# Patient Record
Sex: Female | Born: 1960 | Race: White | Hispanic: No | Marital: Married
Health system: Southern US, Academic
[De-identification: ages and names within clinical notes are randomized; demographics above are authoritative.]

## PROBLEM LIST (undated history)

## (undated) ENCOUNTER — Encounter: Attending: Nurse Practitioner | Primary: Nurse Practitioner

## (undated) ENCOUNTER — Encounter: Attending: Internal Medicine | Primary: Internal Medicine

## (undated) ENCOUNTER — Ambulatory Visit: Payer: PRIVATE HEALTH INSURANCE

## (undated) ENCOUNTER — Encounter

## (undated) ENCOUNTER — Encounter
Attending: Student in an Organized Health Care Education/Training Program | Primary: Student in an Organized Health Care Education/Training Program

## (undated) ENCOUNTER — Encounter: Attending: Primary Care | Primary: Primary Care

## (undated) ENCOUNTER — Ambulatory Visit

## (undated) ENCOUNTER — Telehealth

## (undated) ENCOUNTER — Encounter: Attending: Oncology | Primary: Oncology

## (undated) ENCOUNTER — Encounter
Attending: Pharmacist Clinician (PhC)/ Clinical Pharmacy Specialist | Primary: Pharmacist Clinician (PhC)/ Clinical Pharmacy Specialist

## (undated) ENCOUNTER — Encounter: Attending: Hematology & Oncology | Primary: Hematology & Oncology

## (undated) ENCOUNTER — Encounter: Attending: Family Medicine | Primary: Family Medicine

## (undated) ENCOUNTER — Telehealth
Attending: Pharmacist Clinician (PhC)/ Clinical Pharmacy Specialist | Primary: Pharmacist Clinician (PhC)/ Clinical Pharmacy Specialist

## (undated) ENCOUNTER — Encounter: Attending: Critical Care Medicine | Primary: Critical Care Medicine

## (undated) ENCOUNTER — Telehealth: Attending: Hematology & Oncology | Primary: Hematology & Oncology

## (undated) ENCOUNTER — Telehealth: Attending: Pharmacist | Primary: Pharmacist

## (undated) ENCOUNTER — Encounter: Attending: Adult Health | Primary: Adult Health

## (undated) ENCOUNTER — Telehealth: Attending: Oncology | Primary: Oncology

## (undated) ENCOUNTER — Encounter: Attending: Infectious Disease | Primary: Infectious Disease

## (undated) ENCOUNTER — Encounter: Payer: PRIVATE HEALTH INSURANCE | Attending: Registered" | Primary: Registered"

## (undated) ENCOUNTER — Ambulatory Visit
Payer: PRIVATE HEALTH INSURANCE | Attending: Pharmacist Clinician (PhC)/ Clinical Pharmacy Specialist | Primary: Pharmacist Clinician (PhC)/ Clinical Pharmacy Specialist

## (undated) ENCOUNTER — Telehealth: Attending: Adult Health | Primary: Adult Health

## (undated) ENCOUNTER — Ambulatory Visit: Payer: PRIVATE HEALTH INSURANCE | Attending: Infectious Disease | Primary: Infectious Disease

## (undated) ENCOUNTER — Telehealth: Attending: Registered" | Primary: Registered"

## (undated) ENCOUNTER — Encounter: Attending: Registered" | Primary: Registered"

## (undated) ENCOUNTER — Telehealth
Attending: Student in an Organized Health Care Education/Training Program | Primary: Student in an Organized Health Care Education/Training Program

## (undated) ENCOUNTER — Encounter: Attending: Pharmacist | Primary: Pharmacist

## (undated) ENCOUNTER — Ambulatory Visit: Attending: Clinical | Primary: Clinical

## (undated) ENCOUNTER — Encounter: Attending: Pediatrics | Primary: Pediatrics

## (undated) ENCOUNTER — Encounter: Payer: PRIVATE HEALTH INSURANCE | Attending: Adult Health | Primary: Adult Health

## (undated) ENCOUNTER — Telehealth: Attending: Infectious Disease | Primary: Infectious Disease

## (undated) ENCOUNTER — Telehealth: Attending: Internal Medicine | Primary: Internal Medicine

## (undated) ENCOUNTER — Non-Acute Institutional Stay: Payer: PRIVATE HEALTH INSURANCE

## (undated) ENCOUNTER — Telehealth: Attending: Primary Care | Primary: Primary Care

## (undated) ENCOUNTER — Ambulatory Visit: Attending: Internal Medicine | Primary: Internal Medicine

## (undated) ENCOUNTER — Encounter: Payer: PRIVATE HEALTH INSURANCE | Attending: Hematology & Oncology | Primary: Hematology & Oncology

## (undated) ENCOUNTER — Ambulatory Visit
Payer: PRIVATE HEALTH INSURANCE | Attending: Rehabilitative and Restorative Service Providers" | Primary: Rehabilitative and Restorative Service Providers"

## (undated) ENCOUNTER — Inpatient Hospital Stay

## (undated) DIAGNOSIS — I341 Nonrheumatic mitral (valve) prolapse: Secondary | ICD-10-CM

## (undated) DIAGNOSIS — F329 Major depressive disorder, single episode, unspecified: Secondary | ICD-10-CM

## (undated) DIAGNOSIS — R768 Other specified abnormal immunological findings in serum: Secondary | ICD-10-CM

## (undated) DIAGNOSIS — D72820 Lymphocytosis (symptomatic): Secondary | ICD-10-CM

## (undated) DIAGNOSIS — F419 Anxiety disorder, unspecified: Secondary | ICD-10-CM

## (undated) DIAGNOSIS — F32A Depression, unspecified: Secondary | ICD-10-CM

## (undated) DIAGNOSIS — E079 Disorder of thyroid, unspecified: Secondary | ICD-10-CM

## (undated) DIAGNOSIS — C801 Malignant (primary) neoplasm, unspecified: Secondary | ICD-10-CM

## (undated) DIAGNOSIS — IMO0002 Reserved for concepts with insufficient information to code with codable children: Secondary | ICD-10-CM

## (undated) DIAGNOSIS — D219 Benign neoplasm of connective and other soft tissue, unspecified: Secondary | ICD-10-CM

## (undated) DIAGNOSIS — E039 Hypothyroidism, unspecified: Secondary | ICD-10-CM

## (undated) HISTORY — DX: Benign neoplasm of connective and other soft tissue, unspecified: D21.9

## (undated) HISTORY — PX: REVISION OF ABDOMINAL SCAR: SHX2347

## (undated) HISTORY — DX: Other specified abnormal immunological findings in serum: R76.8

## (undated) HISTORY — DX: Lymphocytosis (symptomatic): D72.820

## (undated) HISTORY — DX: Reserved for concepts with insufficient information to code with codable children: IMO0002

## (undated) HISTORY — DX: Nonrheumatic mitral (valve) prolapse: I34.1

## (undated) HISTORY — PX: TONSILLECTOMY: SUR1361

## (undated) HISTORY — DX: Disorder of thyroid, unspecified: E07.9

## (undated) MED ORDER — POLYETHYLENE GLYCOL 3350 17 GRAM/DOSE ORAL POWDER
Freq: Every day | ORAL | 0 days | PRN
Start: ? — End: 2020-08-12

## (undated) MED ORDER — ZOLPIDEM 10 MG TABLET: Freq: Every evening | ORAL | 0 days | Status: SS | PRN

## (undated) MED ORDER — POLYETHYLENE GLYCOL 3350 17 GRAM ORAL POWDER PACKET: Freq: Every day | ORAL | 0.00000 days | PRN

## (undated) MED ORDER — LORATADINE 10 MG TABLET: 10 mg | tablet | Freq: Every day | 2 refills | 30 days | Status: CN

## (undated) MED ORDER — ZOLPIDEM 5 MG TABLET: tablet | 0 refills | 0 days

## (undated) MED ORDER — SENNOSIDES 8.6 MG-DOCUSATE SODIUM 50 MG TABLET: Freq: Every evening | ORAL | 0 days

---

## 1998-05-18 ENCOUNTER — Other Ambulatory Visit: Admission: RE | Admit: 1998-05-18 | Discharge: 1998-05-18 | Payer: Self-pay | Admitting: *Deleted

## 1998-05-19 ENCOUNTER — Other Ambulatory Visit: Admission: RE | Admit: 1998-05-19 | Discharge: 1998-05-19 | Payer: Self-pay | Admitting: *Deleted

## 1998-07-24 HISTORY — PX: BREAST BIOPSY: SHX20

## 1999-05-16 ENCOUNTER — Other Ambulatory Visit: Admission: RE | Admit: 1999-05-16 | Discharge: 1999-05-16 | Payer: Self-pay | Admitting: *Deleted

## 2000-06-18 ENCOUNTER — Other Ambulatory Visit: Admission: RE | Admit: 2000-06-18 | Discharge: 2000-06-18 | Payer: Self-pay | Admitting: *Deleted

## 2000-09-12 ENCOUNTER — Encounter: Payer: Self-pay | Admitting: Family Medicine

## 2000-09-12 ENCOUNTER — Encounter: Admission: RE | Admit: 2000-09-12 | Discharge: 2000-09-12 | Payer: Self-pay | Admitting: Family Medicine

## 2001-06-24 ENCOUNTER — Other Ambulatory Visit: Admission: RE | Admit: 2001-06-24 | Discharge: 2001-06-24 | Payer: Self-pay | Admitting: *Deleted

## 2002-06-26 ENCOUNTER — Other Ambulatory Visit: Admission: RE | Admit: 2002-06-26 | Discharge: 2002-06-26 | Payer: Self-pay | Admitting: *Deleted

## 2003-03-24 ENCOUNTER — Encounter: Admission: RE | Admit: 2003-03-24 | Discharge: 2003-03-24 | Payer: Self-pay | Admitting: *Deleted

## 2003-06-15 ENCOUNTER — Encounter: Admission: RE | Admit: 2003-06-15 | Discharge: 2003-06-15 | Payer: Self-pay | Admitting: Family Medicine

## 2003-09-10 ENCOUNTER — Other Ambulatory Visit: Admission: RE | Admit: 2003-09-10 | Discharge: 2003-09-10 | Payer: Self-pay | Admitting: *Deleted

## 2004-06-20 ENCOUNTER — Encounter: Admission: RE | Admit: 2004-06-20 | Discharge: 2004-06-20 | Payer: Self-pay | Admitting: Neurosurgery

## 2004-09-13 ENCOUNTER — Encounter: Admission: RE | Admit: 2004-09-13 | Discharge: 2004-09-13 | Payer: Self-pay | Admitting: *Deleted

## 2005-10-16 ENCOUNTER — Encounter: Admission: RE | Admit: 2005-10-16 | Discharge: 2005-10-16 | Payer: Self-pay | Admitting: Family Medicine

## 2007-02-26 ENCOUNTER — Encounter: Admission: RE | Admit: 2007-02-26 | Discharge: 2007-02-26 | Payer: Self-pay | Admitting: Family Medicine

## 2008-04-14 ENCOUNTER — Encounter: Admission: RE | Admit: 2008-04-14 | Discharge: 2008-04-14 | Payer: Self-pay | Admitting: Obstetrics and Gynecology

## 2008-04-22 ENCOUNTER — Other Ambulatory Visit: Admission: RE | Admit: 2008-04-22 | Discharge: 2008-04-22 | Payer: Self-pay | Admitting: Obstetrics and Gynecology

## 2009-02-12 ENCOUNTER — Encounter: Admission: RE | Admit: 2009-02-12 | Discharge: 2009-02-12 | Payer: Self-pay | Admitting: Family Medicine

## 2009-02-16 ENCOUNTER — Ambulatory Visit: Payer: Self-pay | Admitting: Oncology

## 2009-03-05 ENCOUNTER — Encounter: Payer: Self-pay | Admitting: Oncology

## 2009-03-05 ENCOUNTER — Other Ambulatory Visit: Admission: RE | Admit: 2009-03-05 | Discharge: 2009-03-05 | Payer: Self-pay | Admitting: Oncology

## 2009-03-05 LAB — CBC WITH DIFFERENTIAL/PLATELET
BASO%: 0.5 % (ref 0.0–2.0)
Basophils Absolute: 0.1 10*3/uL (ref 0.0–0.1)
EOS%: 0.6 % (ref 0.0–7.0)
Eosinophils Absolute: 0.1 10*3/uL (ref 0.0–0.5)
HCT: 38.8 % (ref 34.8–46.6)
HGB: 13.4 g/dL (ref 11.6–15.9)
LYMPH%: 58.2 % — ABNORMAL HIGH (ref 14.0–49.7)
MCH: 31.9 pg (ref 25.1–34.0)
MCHC: 34.5 g/dL (ref 31.5–36.0)
MCV: 92.4 fL (ref 79.5–101.0)
MONO#: 0.5 10*3/uL (ref 0.1–0.9)
MONO%: 3.5 % (ref 0.0–14.0)
NEUT#: 5.3 10*3/uL (ref 1.5–6.5)
NEUT%: 37.2 % — ABNORMAL LOW (ref 38.4–76.8)
Platelets: 260 10*3/uL (ref 145–400)
RBC: 4.2 10*6/uL (ref 3.70–5.45)
RDW: 12.7 % (ref 11.2–14.5)
WBC: 14.2 10*3/uL — ABNORMAL HIGH (ref 3.9–10.3)
lymph#: 8.3 10*3/uL — ABNORMAL HIGH (ref 0.9–3.3)

## 2009-03-05 LAB — CHCC SMEAR

## 2009-03-08 LAB — LACTATE DEHYDROGENASE: LDH: 172 U/L (ref 94–250)

## 2009-03-08 LAB — IGG, IGA, IGM
IgA: 120 mg/dL (ref 68–378)
IgG (Immunoglobin G), Serum: 432 mg/dL — ABNORMAL LOW (ref 694–1618)
IgM, Serum: 63 mg/dL (ref 60–263)

## 2009-03-08 LAB — BETA 2 MICROGLOBULIN, SERUM: Beta-2 Microglobulin: 1.71 mg/L (ref 1.01–1.73)

## 2009-03-10 LAB — FLOW CYTOMETRY

## 2009-04-23 ENCOUNTER — Other Ambulatory Visit: Admission: RE | Admit: 2009-04-23 | Discharge: 2009-04-23 | Payer: Self-pay | Admitting: Obstetrics and Gynecology

## 2009-09-06 ENCOUNTER — Ambulatory Visit: Payer: Self-pay | Admitting: Oncology

## 2009-09-07 LAB — CBC WITH DIFFERENTIAL/PLATELET
BASO%: 0.3 % (ref 0.0–2.0)
Basophils Absolute: 0.1 10*3/uL (ref 0.0–0.1)
EOS%: 0.7 % (ref 0.0–7.0)
Eosinophils Absolute: 0.1 10*3/uL (ref 0.0–0.5)
HCT: 42.2 % (ref 34.8–46.6)
HGB: 14.4 g/dL (ref 11.6–15.9)
LYMPH%: 67.7 % — ABNORMAL HIGH (ref 14.0–49.7)
MCH: 31.2 pg (ref 25.1–34.0)
MCHC: 34.1 g/dL (ref 31.5–36.0)
MCV: 91.3 fL (ref 79.5–101.0)
MONO#: 0.5 10*3/uL (ref 0.1–0.9)
MONO%: 3.4 % (ref 0.0–14.0)
NEUT#: 4 10*3/uL (ref 1.5–6.5)
NEUT%: 27.9 % — ABNORMAL LOW (ref 38.4–76.8)
Platelets: 264 10*3/uL (ref 145–400)
RBC: 4.62 10*6/uL (ref 3.70–5.45)
RDW: 12.1 % (ref 11.2–14.5)
WBC: 14.3 10*3/uL — ABNORMAL HIGH (ref 3.9–10.3)
lymph#: 9.7 10*3/uL — ABNORMAL HIGH (ref 0.9–3.3)

## 2009-11-17 ENCOUNTER — Encounter: Admission: RE | Admit: 2009-11-17 | Discharge: 2009-11-17 | Payer: Self-pay | Admitting: Internal Medicine

## 2010-04-28 ENCOUNTER — Other Ambulatory Visit: Admission: RE | Admit: 2010-04-28 | Discharge: 2010-04-28 | Payer: Self-pay | Admitting: Obstetrics and Gynecology

## 2010-09-05 ENCOUNTER — Other Ambulatory Visit: Payer: Self-pay | Admitting: Oncology

## 2010-09-05 ENCOUNTER — Encounter (HOSPITAL_BASED_OUTPATIENT_CLINIC_OR_DEPARTMENT_OTHER): Payer: BC Managed Care – PPO | Admitting: Oncology

## 2010-09-05 DIAGNOSIS — C9110 Chronic lymphocytic leukemia of B-cell type not having achieved remission: Secondary | ICD-10-CM

## 2010-09-05 DIAGNOSIS — M542 Cervicalgia: Secondary | ICD-10-CM

## 2010-09-05 DIAGNOSIS — E039 Hypothyroidism, unspecified: Secondary | ICD-10-CM

## 2010-09-05 DIAGNOSIS — D72820 Lymphocytosis (symptomatic): Secondary | ICD-10-CM

## 2010-09-05 LAB — CBC WITH DIFFERENTIAL/PLATELET
BASO%: 0.3 % (ref 0.0–2.0)
Basophils Absolute: 0 10*3/uL (ref 0.0–0.1)
EOS%: 0.3 % (ref 0.0–7.0)
Eosinophils Absolute: 0 10*3/uL (ref 0.0–0.5)
HCT: 39.4 % (ref 34.8–46.6)
HGB: 13.4 g/dL (ref 11.6–15.9)
LYMPH%: 64 % — ABNORMAL HIGH (ref 14.0–49.7)
MCH: 31 pg (ref 25.1–34.0)
MCHC: 34 g/dL (ref 31.5–36.0)
MCV: 91.2 fL (ref 79.5–101.0)
MONO#: 0.4 10*3/uL (ref 0.1–0.9)
MONO%: 2.4 % (ref 0.0–14.0)
NEUT#: 4.8 10*3/uL (ref 1.5–6.5)
NEUT%: 33 % — ABNORMAL LOW (ref 38.4–76.8)
Platelets: 283 10*3/uL (ref 145–400)
RBC: 4.32 10*6/uL (ref 3.70–5.45)
RDW: 12.7 % (ref 11.2–14.5)
WBC: 14.4 10*3/uL — ABNORMAL HIGH (ref 3.9–10.3)
lymph#: 9.2 10*3/uL — ABNORMAL HIGH (ref 0.9–3.3)

## 2011-09-04 ENCOUNTER — Encounter: Payer: Self-pay | Admitting: *Deleted

## 2011-09-05 ENCOUNTER — Other Ambulatory Visit: Payer: BC Managed Care – PPO

## 2011-09-05 ENCOUNTER — Ambulatory Visit: Payer: BC Managed Care – PPO | Admitting: Oncology

## 2012-02-08 ENCOUNTER — Other Ambulatory Visit: Payer: Self-pay | Admitting: Internal Medicine

## 2012-02-08 DIAGNOSIS — Z1231 Encounter for screening mammogram for malignant neoplasm of breast: Secondary | ICD-10-CM

## 2012-03-20 ENCOUNTER — Ambulatory Visit
Admission: RE | Admit: 2012-03-20 | Discharge: 2012-03-20 | Disposition: A | Discharge status: Home / Self Care | Payer: BC Managed Care – PPO | Source: Ambulatory Visit | Attending: Internal Medicine | Admitting: Internal Medicine

## 2012-03-20 DIAGNOSIS — Z1231 Encounter for screening mammogram for malignant neoplasm of breast: Secondary | ICD-10-CM

## 2014-01-27 ENCOUNTER — Other Ambulatory Visit: Payer: Self-pay

## 2014-01-27 DIAGNOSIS — Z1231 Encounter for screening mammogram for malignant neoplasm of breast: Secondary | ICD-10-CM

## 2014-02-02 ENCOUNTER — Ambulatory Visit
Admission: RE | Admit: 2014-02-02 | Discharge: 2014-02-02 | Disposition: A | Discharge status: Home / Self Care | Payer: BC Managed Care – PPO | Source: Ambulatory Visit

## 2014-02-02 ENCOUNTER — Encounter (INDEPENDENT_AMBULATORY_CARE_PROVIDER_SITE_OTHER): Payer: Self-pay

## 2014-02-02 DIAGNOSIS — Z1231 Encounter for screening mammogram for malignant neoplasm of breast: Secondary | ICD-10-CM

## 2015-03-22 ENCOUNTER — Other Ambulatory Visit: Payer: Self-pay

## 2015-03-22 DIAGNOSIS — Z1231 Encounter for screening mammogram for malignant neoplasm of breast: Secondary | ICD-10-CM

## 2015-05-17 ENCOUNTER — Ambulatory Visit
Admission: RE | Admit: 2015-05-17 | Discharge: 2015-05-17 | Disposition: A | Discharge status: Home / Self Care | Payer: BLUE CROSS/BLUE SHIELD | Source: Ambulatory Visit

## 2015-05-17 DIAGNOSIS — Z1231 Encounter for screening mammogram for malignant neoplasm of breast: Secondary | ICD-10-CM

## 2015-12-30 DIAGNOSIS — F332 Major depressive disorder, recurrent severe without psychotic features: Secondary | ICD-10-CM | POA: Diagnosis not present

## 2016-01-19 DIAGNOSIS — F332 Major depressive disorder, recurrent severe without psychotic features: Secondary | ICD-10-CM | POA: Diagnosis not present

## 2016-02-16 DIAGNOSIS — F332 Major depressive disorder, recurrent severe without psychotic features: Secondary | ICD-10-CM | POA: Diagnosis not present

## 2016-02-18 DIAGNOSIS — H5203 Hypermetropia, bilateral: Secondary | ICD-10-CM | POA: Diagnosis not present

## 2016-02-29 DIAGNOSIS — Z79899 Other long term (current) drug therapy: Secondary | ICD-10-CM | POA: Diagnosis not present

## 2016-02-29 DIAGNOSIS — F332 Major depressive disorder, recurrent severe without psychotic features: Secondary | ICD-10-CM | POA: Diagnosis not present

## 2016-02-29 DIAGNOSIS — T23232A Burn of second degree of multiple left fingers (nail), not including thumb, initial encounter: Secondary | ICD-10-CM | POA: Diagnosis not present

## 2016-02-29 DIAGNOSIS — T23242A Burn of second degree of multiple left fingers (nail), including thumb, initial encounter: Secondary | ICD-10-CM | POA: Diagnosis not present

## 2016-03-30 DIAGNOSIS — L814 Other melanin hyperpigmentation: Secondary | ICD-10-CM | POA: Diagnosis not present

## 2016-03-30 DIAGNOSIS — L821 Other seborrheic keratosis: Secondary | ICD-10-CM | POA: Diagnosis not present

## 2016-03-30 DIAGNOSIS — D235 Other benign neoplasm of skin of trunk: Secondary | ICD-10-CM | POA: Diagnosis not present

## 2016-03-30 DIAGNOSIS — D1801 Hemangioma of skin and subcutaneous tissue: Secondary | ICD-10-CM | POA: Diagnosis not present

## 2016-04-19 DIAGNOSIS — F332 Major depressive disorder, recurrent severe without psychotic features: Secondary | ICD-10-CM | POA: Diagnosis not present

## 2016-05-17 DIAGNOSIS — Z79899 Other long term (current) drug therapy: Secondary | ICD-10-CM | POA: Diagnosis not present

## 2016-05-17 DIAGNOSIS — F332 Major depressive disorder, recurrent severe without psychotic features: Secondary | ICD-10-CM | POA: Diagnosis not present

## 2016-06-13 DIAGNOSIS — E038 Other specified hypothyroidism: Secondary | ICD-10-CM | POA: Diagnosis not present

## 2016-06-13 DIAGNOSIS — R8299 Other abnormal findings in urine: Secondary | ICD-10-CM | POA: Diagnosis not present

## 2016-06-13 DIAGNOSIS — Z0000 Encounter for general adult medical examination without abnormal findings: Secondary | ICD-10-CM | POA: Diagnosis not present

## 2016-06-13 DIAGNOSIS — Z23 Encounter for immunization: Secondary | ICD-10-CM | POA: Diagnosis not present

## 2016-06-20 DIAGNOSIS — G4700 Insomnia, unspecified: Secondary | ICD-10-CM | POA: Diagnosis not present

## 2016-06-20 DIAGNOSIS — Z1389 Encounter for screening for other disorder: Secondary | ICD-10-CM | POA: Diagnosis not present

## 2016-06-20 DIAGNOSIS — F321 Major depressive disorder, single episode, moderate: Secondary | ICD-10-CM | POA: Diagnosis not present

## 2016-06-20 DIAGNOSIS — J302 Other seasonal allergic rhinitis: Secondary | ICD-10-CM | POA: Diagnosis not present

## 2016-06-20 DIAGNOSIS — E039 Hypothyroidism, unspecified: Secondary | ICD-10-CM | POA: Diagnosis not present

## 2016-06-20 DIAGNOSIS — Z0000 Encounter for general adult medical examination without abnormal findings: Secondary | ICD-10-CM | POA: Diagnosis not present

## 2016-06-22 ENCOUNTER — Telehealth: Payer: Self-pay | Admitting: *Deleted

## 2016-06-22 NOTE — Telephone Encounter (Signed)
Called pt to inquire about reason for referral. Pt reports her labs have changed but she "can't talk right now." Requests to be called tomorrow AM.  Requested HIM get lab results, Dr. Benay Spice may work her in on call day 12/1 after reviewing labs.

## 2016-06-23 ENCOUNTER — Telehealth: Payer: Self-pay | Admitting: Oncology

## 2016-06-23 ENCOUNTER — Other Ambulatory Visit: Payer: Self-pay | Admitting: *Deleted

## 2016-06-23 ENCOUNTER — Telehealth: Payer: Self-pay | Admitting: *Deleted

## 2016-06-23 ENCOUNTER — Ambulatory Visit (INDEPENDENT_AMBULATORY_CARE_PROVIDER_SITE_OTHER): Payer: BLUE CROSS/BLUE SHIELD | Admitting: Internal Medicine

## 2016-06-23 DIAGNOSIS — Z7184 Encounter for health counseling related to travel: Secondary | ICD-10-CM

## 2016-06-23 DIAGNOSIS — Z789 Other specified health status: Secondary | ICD-10-CM

## 2016-06-23 DIAGNOSIS — C9110 Chronic lymphocytic leukemia of B-cell type not having achieved remission: Secondary | ICD-10-CM

## 2016-06-23 DIAGNOSIS — Z7189 Other specified counseling: Secondary | ICD-10-CM

## 2016-06-23 DIAGNOSIS — Z23 Encounter for immunization: Secondary | ICD-10-CM | POA: Diagnosis not present

## 2016-06-23 DIAGNOSIS — Z9189 Other specified personal risk factors, not elsewhere classified: Secondary | ICD-10-CM

## 2016-06-23 MED ORDER — AZITHROMYCIN 500 MG PO TABS: 500.0000 mg | ORAL_TABLET | Freq: Every day | ORAL | 0 refills | Status: DC

## 2016-06-23 MED ORDER — TYPHOID VACCINE PO CPDR: 1.0000 | DELAYED_RELEASE_CAPSULE | ORAL | 0 refills | Status: DC

## 2016-06-23 MED ORDER — ATOVAQUONE-PROGUANIL HCL 250-100 MG PO TABS: 1.0000 | ORAL_TABLET | Freq: Every day | ORAL | 0 refills | Status: DC

## 2016-06-23 NOTE — Progress Notes (Signed)
Subjective:   Meagan Walsh is a 55 y.o. female who presents to the Infectious Disease clinic for travel consultation. She and her family (husband nad 3 grown children) will be taking a holiday from dec 18 thru dec 31st to [place], [place], [place]. Staying in hotels as well as lodges for safari  Prior travel out of [place]: yes     Objective:   Medications: synthroid, cymbalta,     Assessment:   No contraindications to travel. none    Plan:    pre travel vaccination = gave hep A, oral typhoid vaccine  Malaria prophylaxis = gave rx for malarone  Traveler's diarrhea = gave rx for azithromycin

## 2016-06-23 NOTE — Telephone Encounter (Signed)
Lt vm for GMA (Misty) to fax recent labs.

## 2016-06-23 NOTE — Patient Instructions (Signed)
[facility]                [address], [place]                   [place], [place] [address]                      Phone: [phone]                        Fax: [phone]   Planned departure date: dec 18   Planned return date: dec 31 Countries of travel: [place] and nearby countries  Guidelines for the Prevention & Treatment of Traveler's Diarrhea  Prevention: "Boil it, Peel it, [place] it, or Forget it"   the fewer chances -> lower risk: try to stick to food & water precautions as much as possible"   If it's "piping hot"; it is probably okay, if not, it may not be   Treatment   1) You should always take care to drink lots of fluids in order to avoid dehydration   2) You should bring medications with you in case you come down with a case of diarrhea   3) OTC = bring pepto-bismol - can take with initial abdominal symptoms;                    Imodium - can help slow down your intestinal tract, can help relief cramps                    and diarrhea, can take if no bloody diarrhea  Use pepto-bismol first if needed for traveler's diarrhea. If still on going, can take azithromycin  Guidelines for the Prevention of Malaria  Avoidance:  -fewer mosquito bites = lower risk. Mosquitos can bite at night as well as daytime  -cover up (long sleeve clothing), mosquito nets, screens  -Insect repellent for your skin ( DEET containing lotion > 20%): for clothes ( permethrin spray)   On dec 16th, start malarone for malaria prevention.   Immunizations received today: hep A , 1st dose and oral typhoid vaccine Future immunizations, if indicated  Hep A, 2nd dose for lifetime immunity   Prior to travel:  1) Be sure to pick up appropriate prescriptions, including medicine you take daily. Do not expect to be able to fill your prescriptions abroad.  2) Strongly consider obtaining traveler's insurance, including emergency evacuation insurance.  Most plans in the [place] do not cover participants abroad. (see below for resources)  3) Register at the appropriate [place] embassy or consulate with travel dates so they are aware of your presence in-country and for helpful advice during travel using the [employer] (STEP, GreenNylon.com.cy).  4) Leave contact information with a relative or friend.  5) Keep a Research officer, political party, credit cards in case they become lost or stolen  6) Inform your credit card company that you will be travelling abroad   During travel:  1) If you become ill and need medical advice, the [place] [employer]	[place] of the country you are traveling in general provides a list of [place] speaking doctors.  We are also available on MyChart for remote consultation if you register prior to travel. 2) Avoid motorcycles or scooters when at all possible. Traffic laws in many countries are lax and accidents occur frequently.  3) Do not take any  unnecessary risks that you wouldn't do at home.   Resources:  -Country specific information: BlindResource.ca or GreenNylon.com.cy  -Press photographer (DEET, mosquito nets): [employer], [employer] store, [employer], [place] insurance options: gatewayplans.com; http://clayton-rivera.info/; travelguard.com or [employer], gninsurance.com or info@gninsurance .com, [id number].   Post Travel:  If you return from your trip ill, call your primary care doctor or our travel clinic @ [phone].   Enjoy your trip and know that with proper pre-travel preparation, most people have an enjoyable and uninterrupted trip!

## 2016-06-23 NOTE — Telephone Encounter (Signed)
Message left for pt to call [facility] back at her earliest convenience.

## 2016-06-23 NOTE — Telephone Encounter (Signed)
Call placed to pt and pt states that she needs to be seen for an elevated WBC count noted by her PCP.  Dr. Benay Spice notified and call placed back to patient to inform her of scheduled appt on Tuesday, 06/27/16 at 10:30AM for labs and 11:00AM to see Dr. Benay Spice.  Patient appreciative of call.

## 2016-06-26 ENCOUNTER — Other Ambulatory Visit: Payer: Self-pay | Admitting: Internal Medicine

## 2016-06-26 DIAGNOSIS — Z1231 Encounter for screening mammogram for malignant neoplasm of breast: Secondary | ICD-10-CM

## 2016-06-27 ENCOUNTER — Ambulatory Visit (HOSPITAL_BASED_OUTPATIENT_CLINIC_OR_DEPARTMENT_OTHER): Payer: BLUE CROSS/BLUE SHIELD | Admitting: Oncology

## 2016-06-27 ENCOUNTER — Other Ambulatory Visit (HOSPITAL_BASED_OUTPATIENT_CLINIC_OR_DEPARTMENT_OTHER): Payer: BLUE CROSS/BLUE SHIELD

## 2016-06-27 VITALS — BP 125/72 | HR 79 | Temp 98.3°F | Resp 18 | Wt 167.8 lb

## 2016-06-27 DIAGNOSIS — C9110 Chronic lymphocytic leukemia of B-cell type not having achieved remission: Secondary | ICD-10-CM | POA: Diagnosis not present

## 2016-06-27 DIAGNOSIS — E039 Hypothyroidism, unspecified: Secondary | ICD-10-CM | POA: Diagnosis not present

## 2016-06-27 LAB — CBC WITH DIFFERENTIAL/PLATELET
BASO%: 0.2 % (ref 0.0–2.0)
Basophils Absolute: 0.1 10*3/uL (ref 0.0–0.1)
EOS%: 0.6 % (ref 0.0–7.0)
Eosinophils Absolute: 0.2 10*3/uL (ref 0.0–0.5)
HCT: 42 % (ref 34.8–46.6)
HGB: 14 g/dL (ref 11.6–15.9)
LYMPH%: 82.2 % — ABNORMAL HIGH (ref 14.0–49.7)
MCH: 31 pg (ref 25.1–34.0)
MCHC: 33.3 g/dL (ref 31.5–36.0)
MCV: 92.9 fL (ref 79.5–101.0)
MONO#: 0.5 10*3/uL (ref 0.1–0.9)
MONO%: 1.9 % (ref 0.0–14.0)
NEUT#: 3.9 10*3/uL (ref 1.5–6.5)
NEUT%: 15.1 % — ABNORMAL LOW (ref 38.4–76.8)
Platelets: 205 10*3/uL (ref 145–400)
RBC: 4.52 10*6/uL (ref 3.70–5.45)
RDW: 12.2 % (ref 11.2–14.5)
WBC: 26.2 10*3/uL — ABNORMAL HIGH (ref 3.9–10.3)
lymph#: 21.5 10*3/uL — ABNORMAL HIGH (ref 0.9–3.3)
nRBC: 0 % (ref 0–0)

## 2016-06-27 LAB — CHCC SMEAR

## 2016-06-27 LAB — TECHNOLOGIST REVIEW

## 2016-06-27 NOTE — Progress Notes (Signed)
  [facility] OFFICE PROGRESS NOTE   Diagnosis: CLL  INTERVAL HISTORY:   Meagan Walsh was last seen at the [facility] in February 2012. She recently saw Dr. Osborne Casco and reports the white count was more elevated (we do not have records from Dr. Osborne Casco available today). She is referred for oncology evaluation. She feels well. No fever, night sweats, anorexia, palpable lymph nodes, or recent infection. She is planning a trip to [place] later this month and recently received multiple vaccines. She has received an influenza vaccine.  Objective:  Vital signs in last 24 hours:  Blood pressure 125/72, pulse 79, temperature 98.3 F (36.8 C), temperature source Oral, resp. rate 18, weight 167 lb 12.8 oz (76.1 kg), SpO2 100 %.    HEENT: Neck without mass Lymphatics: No cervical, supraclavicular, axillary, or inguinal nodes Resp: Lungs clear bilaterally Cardio: Regular rate and rhythm GI: No hepatosplenomegaly Vascular: No leg edema   Lab Results:  Lab Results  Component Value Date   WBC 26.2 (H) 06/27/2016   HGB 14.0 06/27/2016   HCT 42.0 06/27/2016   MCV 92.9 06/27/2016   PLT 205 06/27/2016   NEUTROABS 3.9 06/27/2016   09/05/2010: WBC 14.4, ANC 4.8, absolute lymphocyte count 9.2, platelets 283,000  Medications: I have reviewed the patient's current medications.  Assessment/Plan:  1. CLL-stage 0 2. Hypothyroidism 3. 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 She is asymptomatic from the CLL. The lymphocyte count has increased minimally over the past 5 years. There is no indication for treating the CLL.  We discussed indications for treatment including the development of "B symptoms ", bulky lymphadenopathy, and anemia/thrombocytopenia.  She understands that she is at increased risk for infections while having CLL. She should remain up-to-date only influenza and pneumococcal vaccines. She plans to schedule the  23 valent and 13 valent pneumococcal vaccines with Dr. Osborne Casco.  She would like to continue clinical follow-up with Dr.Tisovec. I suggest a yearly CBC with white cell differential. I am available to see her in the future as needed.   Betsy Coder, MD  06/27/2016  11:30 AM

## 2016-07-26 DIAGNOSIS — F332 Major depressive disorder, recurrent severe without psychotic features: Secondary | ICD-10-CM | POA: Diagnosis not present

## 2016-08-11 DIAGNOSIS — F332 Major depressive disorder, recurrent severe without psychotic features: Secondary | ICD-10-CM | POA: Diagnosis not present

## 2016-08-16 DIAGNOSIS — F332 Major depressive disorder, recurrent severe without psychotic features: Secondary | ICD-10-CM | POA: Diagnosis not present

## 2016-08-22 DIAGNOSIS — Z79890 Hormone replacement therapy (postmenopausal): Secondary | ICD-10-CM | POA: Diagnosis not present

## 2016-08-22 DIAGNOSIS — D251 Intramural leiomyoma of uterus: Secondary | ICD-10-CM | POA: Diagnosis not present

## 2016-08-30 ENCOUNTER — Ambulatory Visit
Admission: RE | Admit: 2016-08-30 | Discharge: 2016-08-30 | Disposition: A | Discharge status: Home / Self Care | Payer: BLUE CROSS/BLUE SHIELD | Source: Ambulatory Visit | Attending: Internal Medicine | Admitting: Internal Medicine

## 2016-08-30 DIAGNOSIS — Z1231 Encounter for screening mammogram for malignant neoplasm of breast: Secondary | ICD-10-CM | POA: Diagnosis not present

## 2016-09-04 DIAGNOSIS — F332 Major depressive disorder, recurrent severe without psychotic features: Secondary | ICD-10-CM | POA: Diagnosis not present

## 2016-09-06 DIAGNOSIS — F332 Major depressive disorder, recurrent severe without psychotic features: Secondary | ICD-10-CM | POA: Diagnosis not present

## 2016-10-03 DIAGNOSIS — F332 Major depressive disorder, recurrent severe without psychotic features: Secondary | ICD-10-CM | POA: Diagnosis not present

## 2016-10-05 DIAGNOSIS — F332 Major depressive disorder, recurrent severe without psychotic features: Secondary | ICD-10-CM | POA: Diagnosis not present

## 2016-11-07 DIAGNOSIS — F332 Major depressive disorder, recurrent severe without psychotic features: Secondary | ICD-10-CM | POA: Diagnosis not present

## 2016-11-09 DIAGNOSIS — F332 Major depressive disorder, recurrent severe without psychotic features: Secondary | ICD-10-CM | POA: Diagnosis not present

## 2017-01-04 DIAGNOSIS — F332 Major depressive disorder, recurrent severe without psychotic features: Secondary | ICD-10-CM | POA: Diagnosis not present

## 2017-02-19 DIAGNOSIS — Z6822 Body mass index (BMI) 22.0-22.9, adult: Secondary | ICD-10-CM | POA: Diagnosis not present

## 2017-02-19 DIAGNOSIS — H6983 Other specified disorders of Eustachian tube, bilateral: Secondary | ICD-10-CM | POA: Diagnosis not present

## 2017-02-19 DIAGNOSIS — J302 Other seasonal allergic rhinitis: Secondary | ICD-10-CM | POA: Diagnosis not present

## 2017-02-19 DIAGNOSIS — R05 Cough: Secondary | ICD-10-CM | POA: Diagnosis not present

## 2017-03-22 DIAGNOSIS — Z23 Encounter for immunization: Secondary | ICD-10-CM | POA: Diagnosis not present

## 2017-03-22 DIAGNOSIS — Z1389 Encounter for screening for other disorder: Secondary | ICD-10-CM | POA: Diagnosis not present

## 2017-03-22 DIAGNOSIS — R05 Cough: Secondary | ICD-10-CM | POA: Diagnosis not present

## 2017-03-22 DIAGNOSIS — Z6823 Body mass index (BMI) 23.0-23.9, adult: Secondary | ICD-10-CM | POA: Diagnosis not present

## 2017-05-08 DIAGNOSIS — F332 Major depressive disorder, recurrent severe without psychotic features: Secondary | ICD-10-CM | POA: Diagnosis not present

## 2017-05-23 DIAGNOSIS — H5203 Hypermetropia, bilateral: Secondary | ICD-10-CM | POA: Diagnosis not present

## 2017-05-31 DIAGNOSIS — L821 Other seborrheic keratosis: Secondary | ICD-10-CM | POA: Diagnosis not present

## 2017-06-19 ENCOUNTER — Other Ambulatory Visit: Payer: Self-pay | Admitting: Internal Medicine

## 2017-06-19 DIAGNOSIS — E038 Other specified hypothyroidism: Secondary | ICD-10-CM | POA: Diagnosis not present

## 2017-06-19 DIAGNOSIS — Z1231 Encounter for screening mammogram for malignant neoplasm of breast: Secondary | ICD-10-CM

## 2017-06-19 DIAGNOSIS — Z0000 Encounter for general adult medical examination without abnormal findings: Secondary | ICD-10-CM | POA: Diagnosis not present

## 2017-06-19 DIAGNOSIS — R7309 Other abnormal glucose: Secondary | ICD-10-CM | POA: Diagnosis not present

## 2017-06-22 DIAGNOSIS — Z1389 Encounter for screening for other disorder: Secondary | ICD-10-CM | POA: Diagnosis not present

## 2017-06-22 DIAGNOSIS — F325 Major depressive disorder, single episode, in full remission: Secondary | ICD-10-CM | POA: Diagnosis not present

## 2017-06-22 DIAGNOSIS — Z0000 Encounter for general adult medical examination without abnormal findings: Secondary | ICD-10-CM | POA: Diagnosis not present

## 2017-06-22 DIAGNOSIS — G4709 Other insomnia: Secondary | ICD-10-CM | POA: Diagnosis not present

## 2017-06-22 DIAGNOSIS — R7309 Other abnormal glucose: Secondary | ICD-10-CM | POA: Diagnosis not present

## 2017-06-22 DIAGNOSIS — J302 Other seasonal allergic rhinitis: Secondary | ICD-10-CM | POA: Diagnosis not present

## 2017-07-30 DIAGNOSIS — F332 Major depressive disorder, recurrent severe without psychotic features: Secondary | ICD-10-CM | POA: Diagnosis not present

## 2017-08-08 DIAGNOSIS — F332 Major depressive disorder, recurrent severe without psychotic features: Secondary | ICD-10-CM | POA: Diagnosis not present

## 2017-08-15 DIAGNOSIS — R1900 Intra-abdominal and pelvic swelling, mass and lump, unspecified site: Secondary | ICD-10-CM | POA: Diagnosis not present

## 2017-08-15 DIAGNOSIS — Z6820 Body mass index (BMI) 20.0-20.9, adult: Secondary | ICD-10-CM | POA: Diagnosis not present

## 2017-08-15 DIAGNOSIS — C9100 Acute lymphoblastic leukemia not having achieved remission: Secondary | ICD-10-CM | POA: Diagnosis not present

## 2017-08-21 ENCOUNTER — Ambulatory Visit
Admission: RE | Admit: 2017-08-21 | Discharge: 2017-08-21 | Disposition: A | Discharge status: Home / Self Care | Payer: BLUE CROSS/BLUE SHIELD | Source: Ambulatory Visit | Attending: Internal Medicine | Admitting: Internal Medicine

## 2017-08-21 ENCOUNTER — Other Ambulatory Visit: Payer: Self-pay | Admitting: Internal Medicine

## 2017-08-21 DIAGNOSIS — R590 Localized enlarged lymph nodes: Secondary | ICD-10-CM | POA: Diagnosis not present

## 2017-08-21 DIAGNOSIS — R1909 Other intra-abdominal and pelvic swelling, mass and lump: Secondary | ICD-10-CM

## 2017-08-31 ENCOUNTER — Ambulatory Visit
Admission: RE | Admit: 2017-08-31 | Discharge: 2017-08-31 | Disposition: A | Discharge status: Home / Self Care | Payer: BLUE CROSS/BLUE SHIELD | Source: Ambulatory Visit | Attending: Internal Medicine | Admitting: Internal Medicine

## 2017-08-31 DIAGNOSIS — Z1231 Encounter for screening mammogram for malignant neoplasm of breast: Secondary | ICD-10-CM

## 2017-09-05 DIAGNOSIS — R590 Localized enlarged lymph nodes: Secondary | ICD-10-CM | POA: Diagnosis not present

## 2017-09-05 DIAGNOSIS — C9190 Lymphoid leukemia, unspecified not having achieved remission: Secondary | ICD-10-CM | POA: Diagnosis not present

## 2017-09-06 ENCOUNTER — Telehealth: Payer: Self-pay | Admitting: Oncology

## 2017-09-06 NOTE — Telephone Encounter (Signed)
Scheduled appt per 2/14 sch message- patient is aware of appt date and time

## 2017-09-10 ENCOUNTER — Ambulatory Visit: Payer: BLUE CROSS/BLUE SHIELD | Admitting: Oncology

## 2017-09-11 ENCOUNTER — Inpatient Hospital Stay: Payer: BLUE CROSS/BLUE SHIELD

## 2017-09-11 ENCOUNTER — Inpatient Hospital Stay: Payer: BLUE CROSS/BLUE SHIELD | Attending: Oncology | Admitting: Oncology

## 2017-09-11 VITALS — BP 111/62 | HR 72 | Temp 97.9°F | Resp 18 | Ht 65.5 in | Wt 130.7 lb

## 2017-09-11 DIAGNOSIS — B1910 Unspecified viral hepatitis B without hepatic coma: Secondary | ICD-10-CM | POA: Diagnosis not present

## 2017-09-11 DIAGNOSIS — C9190 Lymphoid leukemia, unspecified not having achieved remission: Secondary | ICD-10-CM | POA: Diagnosis not present

## 2017-09-11 DIAGNOSIS — C9110 Chronic lymphocytic leukemia of B-cell type not having achieved remission: Secondary | ICD-10-CM

## 2017-09-11 DIAGNOSIS — E039 Hypothyroidism, unspecified: Secondary | ICD-10-CM | POA: Insufficient documentation

## 2017-09-11 LAB — CMP (CANCER CENTER ONLY)
ALT: 10 U/L (ref 0–55)
AST: 15 U/L (ref 5–34)
Albumin: 4.5 g/dL (ref 3.5–5.0)
Alkaline Phosphatase: 83 U/L (ref 40–150)
Anion gap: 9 (ref 3–11)
BUN: 15 mg/dL (ref 7–26)
CO2: 26 mmol/L (ref 22–29)
Calcium: 10.2 mg/dL (ref 8.4–10.4)
Chloride: 106 mmol/L (ref 98–109)
Creatinine: 1.03 mg/dL (ref 0.60–1.10)
GFR, Est AFR Am: >60 mL/min (ref >60)
GFR, Estimated: 59 mL/min — ABNORMAL LOW (ref >60)
Glucose, Bld: 111 mg/dL (ref 70–140)
Potassium: 4.3 mmol/L (ref 3.5–5.1)
Sodium: 141 mmol/L (ref 136–145)
Total Bilirubin: 0.5 mg/dL (ref 0.2–1.2)
Total Protein: 6.7 g/dL (ref 6.4–8.3)

## 2017-09-11 LAB — CBC WITH DIFFERENTIAL (CANCER CENTER ONLY)
Basophils Absolute: 0 10*3/uL (ref 0.0–0.1)
Basophils Relative: 0 %
Eosinophils Absolute: 0.1 10*3/uL (ref 0.0–0.5)
Eosinophils Relative: 1 %
HCT: 33.2 % — ABNORMAL LOW (ref 34.8–46.6)
Hemoglobin: 10.7 g/dL — ABNORMAL LOW (ref 11.6–15.9)
Lymphocytes Relative: 84 %
Lymphs Abs: 21.4 10*3/uL — ABNORMAL HIGH (ref 0.9–3.3)
MCH: 34 pg (ref 25.1–34.0)
MCHC: 32.2 g/dL (ref 31.5–36.0)
MCV: 105.4 fL — ABNORMAL HIGH (ref 79.5–101.0)
Monocytes Absolute: 0.2 10*3/uL (ref 0.1–0.9)
Monocytes Relative: 1 %
Neutro Abs: 3.5 10*3/uL (ref 1.5–6.5)
Neutrophils Relative %: 14 %
Platelet Count: 202 10*3/uL (ref 145–400)
RBC: 3.15 MIL/uL — ABNORMAL LOW (ref 3.70–5.45)
RDW: 14 % (ref 11.2–14.5)
WBC Count: 25.3 10*3/uL — ABNORMAL HIGH (ref 3.9–10.3)

## 2017-09-11 LAB — SAVE SMEAR

## 2017-09-11 NOTE — Progress Notes (Signed)
  [facility] OFFICE PROGRESS NOTE   Diagnosis: CLL  INTERVAL HISTORY:   Meagan Walsh was last seen in the hematology clinic in December 2017.  She appeared asymptomatic from CLL at the time. She noted a fullness in the left groin approximately 1 month ago.  She was rated at the office of Dr. Osborne Casco and referred for an ultrasound.  The ultrasound confirmed an enlarged left inguinal lymph node measuring 2.9 cm with a preserved central fatty hilum.  She otherwise feels well.  Good appetite.  No fever or night sweats.  No other palpable lymph nodes.  No recent infection.  Objective:  Vital signs in last 24 hours:  Blood pressure 111/62, pulse 72, temperature 97.9 F (36.6 C), temperature source Oral, resp. rate 18, height 5' 5.5" (1.664 m), weight 130 lb 11.2 oz (59.3 kg), SpO2 100 %.    HEENT: Oropharynx without visible mass, neck without mass Lymphatics: 0.5 cm submental node, 1/2-1 cm mobile bilateral axillary nodes.  Firm 3 cm mobile left inguinal node.  No right inguinal or femoral nodes. Resp: Lungs clear bilaterally, no respiratory distress Cardio: Regular rate and rhythm GI: No hepatomegaly, the spleen tip is palpable in the left lateral abdomen Vascular: No leg edema    Lab Results:  Lab Results  Component Value Date   WBC 26.2 (H) 06/27/2016   HGB 14.0 06/27/2016   HCT 42.0 06/27/2016   MCV 92.9 06/27/2016   PLT 205 06/27/2016   NEUTROABS 3.9 06/27/2016     Medications: I have reviewed the patient's current medications.   Assessment/Plan:    Disposition: 1. CLL- diagnosed in August 2010  Left inguinal lymph node January 2019, neck/axillary nodes and palpable splenomegaly 09/11/2017 2. Hypothyroidism 3. 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  She remains asymptomatic from the CLL other than the palpable left inguinal node.  The left inguinal node is bothersome secondary to  its location.  The left inguinal lymph node is likely related to progression of CLL.  I have a low clinical suspicion for another malignancy or transformation to a high-grade lymphoma.  There are other small palpable lymph nodes and splenomegaly on exam today.  I discussed the differential diagnosis with Meagan Walsh.  She agrees to an ultrasound-guided biopsy of the lymph node.  She will also be referred for staging CT scans.  We will obtain a laboratory evaluation today.  Meagan Walsh will return for office visit and further discussion on 09/21/2017.  25 minutes were spent with the patient today.  The majority of the time was used for counseling and coordination of care.  Betsy Coder, MD  09/11/2017  4:18 PM

## 2017-09-12 ENCOUNTER — Other Ambulatory Visit: Payer: Self-pay

## 2017-09-12 ENCOUNTER — Telehealth: Payer: Self-pay | Admitting: Oncology

## 2017-09-12 DIAGNOSIS — C9110 Chronic lymphocytic leukemia of B-cell type not having achieved remission: Secondary | ICD-10-CM

## 2017-09-12 LAB — RETICULOCYTES
RBC.: 3.14 MIL/uL — ABNORMAL LOW (ref 3.70–5.45)
Retic Count, Absolute: 78.5 10*3/uL (ref 33.7–90.7)
Retic Ct Pct: 2.5 % — ABNORMAL HIGH (ref 0.7–2.1)

## 2017-09-12 LAB — IGG, IGA, IGM
IgA: 9 mg/dL — ABNORMAL LOW (ref 87–352)
IgG (Immunoglobin G), Serum: 212 mg/dL — ABNORMAL LOW (ref 700–1600)
IgM (Immunoglobulin M), Srm: 27 mg/dL (ref 26–217)

## 2017-09-12 LAB — LACTATE DEHYDROGENASE: LDH: 135 U/L (ref 125–245)

## 2017-09-12 LAB — DIRECT ANTIGLOBULIN TEST (NOT AT ARMC)
DAT, IgG: NEGATIVE
DAT, complement: NEGATIVE

## 2017-09-12 LAB — BETA 2 MICROGLOBULIN, SERUM: Beta-2 Microglobulin: 1.8 mg/L (ref 0.6–2.4)

## 2017-09-12 NOTE — Telephone Encounter (Signed)
Scheduled appt per 2/19 los - Patient is aware of appt date and time,.

## 2017-09-13 ENCOUNTER — Telehealth: Payer: Self-pay | Admitting: *Deleted

## 2017-09-13 DIAGNOSIS — C9110 Chronic lymphocytic leukemia of B-cell type not having achieved remission: Secondary | ICD-10-CM

## 2017-09-13 NOTE — Telephone Encounter (Signed)
Call from pt asking about biopsy and CT appointments. [facility], they will see pt as walk in on 2/25. Informed her radiology will call with biopsy appointment. She voiced understanding.

## 2017-09-16 ENCOUNTER — Other Ambulatory Visit: Payer: Self-pay | Admitting: Radiology

## 2017-09-17 ENCOUNTER — Ambulatory Visit
Admission: RE | Admit: 2017-09-17 | Discharge: 2017-09-17 | Disposition: A | Discharge status: Home / Self Care | Payer: BLUE CROSS/BLUE SHIELD | Source: Ambulatory Visit | Attending: Oncology | Admitting: Oncology

## 2017-09-17 DIAGNOSIS — F332 Major depressive disorder, recurrent severe without psychotic features: Secondary | ICD-10-CM | POA: Diagnosis not present

## 2017-09-17 DIAGNOSIS — C9110 Chronic lymphocytic leukemia of B-cell type not having achieved remission: Secondary | ICD-10-CM

## 2017-09-17 DIAGNOSIS — R918 Other nonspecific abnormal finding of lung field: Secondary | ICD-10-CM | POA: Diagnosis not present

## 2017-09-17 DIAGNOSIS — R1909 Other intra-abdominal and pelvic swelling, mass and lump: Secondary | ICD-10-CM | POA: Diagnosis not present

## 2017-09-17 LAB — IMMUNOFIXATION ELECTROPHORESIS
IgA: 15 mg/dL — ABNORMAL LOW (ref 87–352)
IgG (Immunoglobin G), Serum: 209 mg/dL — ABNORMAL LOW (ref 700–1600)
IgM (Immunoglobulin M), Srm: 24 mg/dL — ABNORMAL LOW (ref 26–217)
Total Protein ELP: 6.3 g/dL (ref 6.0–8.5)

## 2017-09-17 MED ORDER — IOPAMIDOL (ISOVUE-300) INJECTION 61%
100.0000 mL | Freq: Once | INTRAVENOUS | Status: AC | PRN
  Administered 2017-09-17: 100 mL via INTRAVENOUS

## 2017-09-18 ENCOUNTER — Ambulatory Visit (HOSPITAL_COMMUNITY)
Admission: RE | Admit: 2017-09-18 | Discharge: 2017-09-18 | Disposition: A | Discharge status: Home / Self Care | Payer: BLUE CROSS/BLUE SHIELD | Source: Ambulatory Visit | Attending: Oncology | Admitting: Oncology

## 2017-09-18 ENCOUNTER — Encounter (HOSPITAL_COMMUNITY): Payer: Self-pay

## 2017-09-18 DIAGNOSIS — Z975 Presence of (intrauterine) contraceptive device: Secondary | ICD-10-CM | POA: Diagnosis not present

## 2017-09-18 DIAGNOSIS — E039 Hypothyroidism, unspecified: Secondary | ICD-10-CM | POA: Diagnosis not present

## 2017-09-18 DIAGNOSIS — Z79890 Hormone replacement therapy (postmenopausal): Secondary | ICD-10-CM | POA: Diagnosis not present

## 2017-09-18 DIAGNOSIS — C9190 Lymphoid leukemia, unspecified not having achieved remission: Secondary | ICD-10-CM | POA: Diagnosis not present

## 2017-09-18 DIAGNOSIS — L041 Acute lymphadenitis of trunk: Secondary | ICD-10-CM | POA: Diagnosis not present

## 2017-09-18 DIAGNOSIS — D259 Leiomyoma of uterus, unspecified: Secondary | ICD-10-CM | POA: Insufficient documentation

## 2017-09-18 DIAGNOSIS — R161 Splenomegaly, not elsewhere classified: Secondary | ICD-10-CM | POA: Diagnosis not present

## 2017-09-18 DIAGNOSIS — Z79899 Other long term (current) drug therapy: Secondary | ICD-10-CM | POA: Diagnosis not present

## 2017-09-18 DIAGNOSIS — C9110 Chronic lymphocytic leukemia of B-cell type not having achieved remission: Secondary | ICD-10-CM

## 2017-09-18 DIAGNOSIS — L048 Acute lymphadenitis of other sites: Secondary | ICD-10-CM | POA: Diagnosis not present

## 2017-09-18 DIAGNOSIS — I341 Nonrheumatic mitral (valve) prolapse: Secondary | ICD-10-CM | POA: Diagnosis not present

## 2017-09-18 DIAGNOSIS — R590 Localized enlarged lymph nodes: Secondary | ICD-10-CM | POA: Diagnosis not present

## 2017-09-18 LAB — CBC WITH DIFFERENTIAL/PLATELET
Basophils Absolute: 0 10*3/uL (ref 0.0–0.1)
Basophils Relative: 0 %
Eosinophils Absolute: 0 10*3/uL (ref 0.0–0.7)
Eosinophils Relative: 0 %
HCT: 35.6 % — ABNORMAL LOW (ref 36.0–46.0)
Hemoglobin: 11.7 g/dL — ABNORMAL LOW (ref 12.0–15.0)
Lymphocytes Relative: 89 %
Lymphs Abs: 20.1 10*3/uL — ABNORMAL HIGH (ref 0.7–4.0)
MCH: 34.5 pg — ABNORMAL HIGH (ref 26.0–34.0)
MCHC: 32.9 g/dL (ref 30.0–36.0)
MCV: 105 fL — ABNORMAL HIGH (ref 78.0–100.0)
Monocytes Absolute: 0.2 10*3/uL (ref 0.1–1.0)
Monocytes Relative: 1 %
Neutro Abs: 2.3 10*3/uL (ref 1.7–7.7)
Neutrophils Relative %: 10 %
Platelets: 205 10*3/uL (ref 150–400)
RBC: 3.39 MIL/uL — ABNORMAL LOW (ref 3.87–5.11)
RDW: 14.1 % (ref 11.5–15.5)
WBC: 22.6 10*3/uL — ABNORMAL HIGH (ref 4.0–10.5)

## 2017-09-18 LAB — PROTIME-INR
INR: 1.33
Prothrombin Time: 16.4 seconds — ABNORMAL HIGH (ref 11.4–15.2)

## 2017-09-18 MED ORDER — LIDOCAINE-EPINEPHRINE (PF) 2 %-1:200000 IJ SOLN
INTRAMUSCULAR | Status: AC
  Filled 2017-09-18: qty 20

## 2017-09-18 MED ORDER — MIDAZOLAM HCL 2 MG/2ML IJ SOLN
INTRAMUSCULAR | Status: AC
  Filled 2017-09-18: qty 4

## 2017-09-18 MED ORDER — SODIUM CHLORIDE 0.9 % IV SOLN
INTRAVENOUS | Status: DC
  Administered 2017-09-18: 11:00:00 via INTRAVENOUS

## 2017-09-18 MED ORDER — MIDAZOLAM HCL 2 MG/2ML IJ SOLN
INTRAMUSCULAR | Status: AC | PRN
  Administered 2017-09-18 (×2): 1 mg via INTRAVENOUS

## 2017-09-18 MED ORDER — FENTANYL CITRATE (PF) 100 MCG/2ML IJ SOLN
INTRAMUSCULAR | Status: AC | PRN
  Administered 2017-09-18 (×2): 50 ug via INTRAVENOUS

## 2017-09-18 MED ORDER — FENTANYL CITRATE (PF) 100 MCG/2ML IJ SOLN
INTRAMUSCULAR | Status: AC
  Filled 2017-09-18: qty 2

## 2017-09-18 NOTE — Consult Note (Signed)
Chief Complaint: Patient was seen in consultation today for image guided left inguinal lymph node biopsy  Referring Physician(s): Sherrill,Gary B  Supervising Physician: Sandi Mariscal  Patient Status: [facility] - Out-pt  History of Present Illness: Meagan Walsh is a 57 y.o. female with history of CLL, initially diagnosed in 2010, who now presents with imaging findings of 3 cm necrotic appearing nodal lesion in the left inguinal area as well as borderline enlarged pelvic lymph nodes and small scattered retroperitoneal lymph nodes, small bilateral axillary lymph nodes, borderline mediastinal and left hilar lymph nodes and mild splenomegaly.  She presents today for image guided left inguinal lymph node biopsy for further evaluation.  Past Medical History:  Diagnosis Date  . Degenerative disk disease    Neck--chronic pain  . Fibroids    Uterine  . 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 Lymphocytosis   . Mitral valve prolapse   . Thyroid disease    Hypothyroid    Past Surgical History:  Procedure Laterality Date  . BREAST BIOPSY Left 2000  . CESAREAN SECTION  1996  . REVISION OF ABDOMINAL SCAR    . TONSILLECTOMY     age 62    Allergies: Patient has no known allergies.  Medications: Prior to Admission medications   Medication Sig Start Date End Date Taking? Authorizing Provider  lamoTRIgine (LAMICTAL) 200 MG tablet Take 200 mg by mouth 2 (two) times daily. 08/06/17  Yes [provider]  levothyroxine (SYNTHROID, LEVOTHROID) 100 MCG tablet Take 100 mcg by mouth daily.   Yes [provider]  vortioxetine HBr (TRINTELLIX) 20 MG TABS Take by mouth.   Yes [provider]  zolpidem (AMBIEN) 10 MG tablet Take 10 mg by mouth at bedtime as needed.   Yes [provider]  azithromycin (ZITHROMAX) 500 MG tablet Take 1 tablet (500 mg total) by mouth daily. If you have 3+ loose stools in 24hr. Can take up to 3 pills, may  stop taking if diarrhea resolves early 06/23/16   Carlyle Basques, MD  Cholecalciferol (VITAMIN D PO) Take 5,000 Units by mouth daily.    [provider]  estradiol (VIVELLE-DOT) 0.0375 MG/24HR Place onto the skin. 08/24/16   [provider]  levonorgestrel (MIRENA) 20 MCG/24HR IUD by Intrauterine route.    [provider]  Norethin Ace-Eth Estrad-FE (LOESTRIN FE 1.5/30 PO) Take 1 tablet by mouth daily.    [provider]     Family History  Problem Relation Age of Onset  . Breast cancer Mother     Social History   Socioeconomic History  . Marital status: Married    Spouse name: None  . Number of children: None  . Years of education: None  . Highest education level: None  Social Needs  . Financial resource strain: None  . Food insecurity - worry: None  . Food insecurity - inability: None  . Transportation needs - medical: None  . Transportation needs - non-medical: None  Occupational History  . None  Tobacco Use  . Smoking status: Never Smoker  . Smokeless tobacco: Never Used  Substance and Sexual Activity  . Alcohol use: None  . Drug use: None  . Sexual activity: None  Other Topics Concern  . None  Social History Narrative  . None      Review of Systems currently denies fever, headache, chest pain, dyspnea, cough, abdominal/back pain, nausea, vomiting or bleeding.  She does have some mild  tenderness in left inguinal region  Vital Signs: Blood pressure 118/68, heart rate 76, respirations 16, temp 98.4, O2 sat 100% room air   Physical Exam awake, alert.  Chest clear to auscultation bilaterally.  Heart with regular rate and rhythm.  Abdomen soft, positive bowel sounds, mild splenomegaly, firm, mildly tender enlarged left inguinal lymph node; no lower extremity edema  Imaging: Ct Chest W Contrast  Result Date: 09/17/2017 CLINICAL DATA:  History of CLL.  Left groin mass. EXAM: CT CHEST, ABDOMEN, AND PELVIS WITH CONTRAST TECHNIQUE:  Multidetector CT imaging of the chest, abdomen and pelvis was performed following the standard protocol during bolus administration of intravenous contrast. CONTRAST:  126mL ISOVUE-300 IOPAMIDOL (ISOVUE-300) INJECTION 61% COMPARISON:  None. FINDINGS: CT CHEST FINDINGS Cardiovascular: The heart is normal in size. No pericardial effusion. Fluid noted in the pericardial recesses. The aorta is normal in caliber. No dissection. The branch vessels are patent. No obvious coronary artery calcifications. Mediastinum/Nodes: Small scattered mediastinal and hilar lymph nodes but no mass or overt adenopathy. 9 mm subcarinal lymph node on image number 29. 7 mm left infrahilar lymph node on image number 32. The esophagus is grossly normal. Lungs/Pleura: No worrisome pulmonary lesions are identified. Patchy E peribronchial thickening and subsegmental atelectasis or scarring noted in the left lower lobe. No acute infiltrate, pleural effusion or pulmonary edema. No interstitial lung disease or bronchiectasis. Musculoskeletal: No breast masses, supraclavicular or axillary lymphadenopathy. There are several small scattered bilateral axillary lymph nodes with normal appearing fatty hila. Index node in the left axilla on image number 15 measures 8 mm and index lesion in the right axilla on image number 23 measures 9.5 mm. 5.5 mm left supraclavicular node on image 6. No significant bony findings. CT ABDOMEN PELVIS FINDINGS Hepatobiliary: No focal hepatic lesions other than a few small scattered cysts. No worrisome lesions or intrahepatic biliary dilatation. The gallbladder is unremarkable. No common bile duct dilatation. Pancreas: No mass, inflammation or ductal dilatation. Spleen: Borderline splenic size. The spleen measures 15 x 10 x 10 cm. No focal lesions. Adrenals/Urinary Tract: The adrenal glands and kidneys are unremarkable. Small renal cysts are noted. No renal, ureteral or bladder calculi or mass. Stomach/Bowel: The stomach,  duodenum, small bowel and colon are grossly normal without oral contrast. No acute inflammatory process, mass lesions or obstructive findings. The colon demonstrates moderate stool, fluid and air which could suggest a colonic ileus. No wall thickening or pericolonic inflammatory changes to suggest acute colitis. The terminal ileum is normal. The appendix is normal. Vascular/Lymphatic: The aorta is normal in caliber. No dissection. The branch vessels are patent. The major venous structures are patent. No mesenteric or retroperitoneal mass or adenopathy. Small scattered lymph nodes are noted. Borderline operator lymph nodes in the pelvis bilaterally. The left measures 11.5 mm and the right 10.5 mm on image number 108 Left-sided retroperitoneal lymph nodes are noted. There is a 7.5 mm node on image number 80. Reproductive: The uterus demonstrates a 3.5 cm right-sided fibroid. Other smaller fibroids are noted. There is an IUD in place. No complicating features. The uterus is retroverted. The ovaries are grossly normal. Other: There is a 3.0 x 3.0 cm necrotic appearing lesion in the left inguinal area which could be a necrotic lymph node. Small lymph nodes are noted bilaterally. Musculoskeletal: No significant bony findings. IMPRESSION: 1. 3 cm necrotic appearing nodal lesion in the left inguinal area. Recommend biopsy. 2. Borderline enlarged pelvic lymph nodes and small scattered retroperitoneal lymph nodes in the abdomen. 3.  Small bilateral axillary lymph nodes. 4. Borderline mediastinal and left hilar lymph nodes. 5. Mild splenomegaly. Electronically Signed   By: Marijo Sanes M.D.   On: 09/17/2017 15:48   US Pelvis Limited (transabdominal Only)  Result Date: 08/21/2017 CLINICAL DATA:  Left inguinal mass EXAM: LIMITED ULTRASOUND OF PELVIS TECHNIQUE: Limited transabdominal ultrasound examination of the pelvis was performed. COMPARISON:  None. FINDINGS: Ultrasound within the left inguinal region demonstrates  prominent left inguinal lymph node with a long-axis diameter of 2.9 cm and a short axis diameter of 1.9 cm. This maintains a central fatty hilum. IMPRESSION: Prominent left inguinal lymph node in the area of concern with a short axis diameter of 1.9 cm. This is enlarged based on size criteria but maintains a normal central fatty hilum. Consider clinical follow-up to assess for change. Electronically Signed   By: Rolm Baptise M.D.   On: 08/21/2017 19:36   Ct Abdomen Pelvis W Contrast  Result Date: 09/17/2017 CLINICAL DATA:  History of CLL.  Left groin mass. EXAM: CT CHEST, ABDOMEN, AND PELVIS WITH CONTRAST TECHNIQUE: Multidetector CT imaging of the chest, abdomen and pelvis was performed following the standard protocol during bolus administration of intravenous contrast. CONTRAST:  132mL ISOVUE-300 IOPAMIDOL (ISOVUE-300) INJECTION 61% COMPARISON:  None. FINDINGS: CT CHEST FINDINGS Cardiovascular: The heart is normal in size. No pericardial effusion. Fluid noted in the pericardial recesses. The aorta is normal in caliber. No dissection. The branch vessels are patent. No obvious coronary artery calcifications. Mediastinum/Nodes: Small scattered mediastinal and hilar lymph nodes but no mass or overt adenopathy. 9 mm subcarinal lymph node on image number 29. 7 mm left infrahilar lymph node on image number 32. The esophagus is grossly normal. Lungs/Pleura: No worrisome pulmonary lesions are identified. Patchy E peribronchial thickening and subsegmental atelectasis or scarring noted in the left lower lobe. No acute infiltrate, pleural effusion or pulmonary edema. No interstitial lung disease or bronchiectasis. Musculoskeletal: No breast masses, supraclavicular or axillary lymphadenopathy. There are several small scattered bilateral axillary lymph nodes with normal appearing fatty hila. Index node in the left axilla on image number 15 measures 8 mm and index lesion in the right axilla on image number 23 measures 9.5 mm.  5.5 mm left supraclavicular node on image 6. No significant bony findings. CT ABDOMEN PELVIS FINDINGS Hepatobiliary: No focal hepatic lesions other than a few small scattered cysts. No worrisome lesions or intrahepatic biliary dilatation. The gallbladder is unremarkable. No common bile duct dilatation. Pancreas: No mass, inflammation or ductal dilatation. Spleen: Borderline splenic size. The spleen measures 15 x 10 x 10 cm. No focal lesions. Adrenals/Urinary Tract: The adrenal glands and kidneys are unremarkable. Small renal cysts are noted. No renal, ureteral or bladder calculi or mass. Stomach/Bowel: The stomach, duodenum, small bowel and colon are grossly normal without oral contrast. No acute inflammatory process, mass lesions or obstructive findings. The colon demonstrates moderate stool, fluid and air which could suggest a colonic ileus. No wall thickening or pericolonic inflammatory changes to suggest acute colitis. The terminal ileum is normal. The appendix is normal. Vascular/Lymphatic: The aorta is normal in caliber. No dissection. The branch vessels are patent. The major venous structures are patent. No mesenteric or retroperitoneal mass or adenopathy. Small scattered lymph nodes are noted. Borderline operator lymph nodes in the pelvis bilaterally. The left measures 11.5 mm and the right 10.5 mm on image number 108 Left-sided retroperitoneal lymph nodes are noted. There is a 7.5 mm node on image number 80. Reproductive: The uterus demonstrates a  3.5 cm right-sided fibroid. Other smaller fibroids are noted. There is an IUD in place. No complicating features. The uterus is retroverted. The ovaries are grossly normal. Other: There is a 3.0 x 3.0 cm necrotic appearing lesion in the left inguinal area which could be a necrotic lymph node. Small lymph nodes are noted bilaterally. Musculoskeletal: No significant bony findings. IMPRESSION: 1. 3 cm necrotic appearing nodal lesion in the left inguinal area.  Recommend biopsy. 2. Borderline enlarged pelvic lymph nodes and small scattered retroperitoneal lymph nodes in the abdomen. 3. Small bilateral axillary lymph nodes. 4. Borderline mediastinal and left hilar lymph nodes. 5. Mild splenomegaly. Electronically Signed   By: Marijo Sanes M.D.   On: 09/17/2017 15:48   Mm Screening Breast Tomo Bilateral  Result Date: 08/31/2017 CLINICAL DATA:  Screening. EXAM: DIGITAL SCREENING BILATERAL MAMMOGRAM WITH TOMO AND CAD COMPARISON:  Previous exam(s). ACR Breast Density Category c: The breast tissue is heterogeneously dense, which may obscure small masses. FINDINGS: There are no findings suspicious for malignancy. Images were processed with CAD. IMPRESSION: No mammographic evidence of malignancy. A result letter of this screening mammogram will be mailed directly to the patient. RECOMMENDATION: Screening mammogram in one year. (Code:SM-B-01Y) BI-RADS CATEGORY  1: Negative. Electronically Signed   By: Curlene Dolphin M.D.   On: 08/31/2017 16:34    Labs:  CBC: Recent Labs    09/11/17 1602  WBC 25.3*  HCT 33.2*  PLT 202    COAGS: Recent Labs    09/18/17 1118  INR 1.33    BMP: Recent Labs    09/11/17 1602  NA 141  K 4.3  CL 106  CO2 26  GLUCOSE 111  BUN 15  CALCIUM 10.2  CREATININE 1.03  GFRNONAA 59*  GFRAA >60    LIVER FUNCTION TESTS: Recent Labs    09/11/17 1602  BILITOT 0.5  AST 15  ALT 10  ALKPHOS 83  PROT 6.7  ALBUMIN 4.5    TUMOR MARKERS: No results for input(s): AFPTM, CEA, CA199, CHROMGRNA in the last 8760 hours.  Assessment and Plan: 57 y.o. female with history of CLL, initially diagnosed in 2010, who now presents with imaging findings of 3 cm necrotic appearing nodal lesion in the left inguinal area as well as borderline enlarged pelvic lymph nodes and small scattered retroperitoneal lymph nodes, small bilateral axillary lymph nodes, borderline mediastinal and left hilar lymph nodes and mild splenomegaly.  She presents  today for image guided left inguinal lymph node biopsy for further evaluation.Risks and benefits discussed with the patient including, but not limited to bleeding, infection, damage to adjacent structures or low yield requiring additional tests.  All of the patient's questions were answered, patient is agreeable to proceed. Consent signed and in chart.     Thank you for this interesting consult.  I greatly enjoyed meeting Meagan Walsh and look forward to participating in their care.  A copy of this report was sent to the requesting provider on this date.  Electronically Signed: D. Rowe Robert, PA-C 09/18/2017, 11:59 AM   I spent a total of 20 minutes    in face to face in clinical consultation, greater than 50% of which was counseling/coordinating care for image guided left inguinal lymph node biopsy

## 2017-09-18 NOTE — Procedures (Signed)
Pre Procedure Dx: Left inguinal Lymphadenopathy Post Procedural Dx: Same  Technically successful US guided biopsy of indeterminate left inguinal LN  EBL: None  No immediate complications.   Ronny Bacon, MD Pager #: [phone]

## 2017-09-18 NOTE — Discharge Instructions (Signed)

## 2017-09-21 ENCOUNTER — Telehealth: Payer: Self-pay | Admitting: *Deleted

## 2017-09-21 ENCOUNTER — Encounter: Payer: Self-pay | Admitting: Oncology

## 2017-09-21 ENCOUNTER — Inpatient Hospital Stay: Payer: BLUE CROSS/BLUE SHIELD | Attending: Oncology | Admitting: Oncology

## 2017-09-21 ENCOUNTER — Telehealth: Payer: Self-pay | Admitting: Oncology

## 2017-09-21 ENCOUNTER — Other Ambulatory Visit: Payer: BLUE CROSS/BLUE SHIELD

## 2017-09-21 VITALS — BP 96/65 | HR 87 | Temp 98.1°F | Resp 17 | Ht 66.0 in | Wt 127.3 lb

## 2017-09-21 DIAGNOSIS — C9110 Chronic lymphocytic leukemia of B-cell type not having achieved remission: Secondary | ICD-10-CM

## 2017-09-21 DIAGNOSIS — J189 Pneumonia, unspecified organism: Secondary | ICD-10-CM | POA: Diagnosis not present

## 2017-09-21 DIAGNOSIS — E039 Hypothyroidism, unspecified: Secondary | ICD-10-CM | POA: Diagnosis not present

## 2017-09-21 DIAGNOSIS — C9190 Lymphoid leukemia, unspecified not having achieved remission: Secondary | ICD-10-CM | POA: Insufficient documentation

## 2017-09-21 DIAGNOSIS — R05 Cough: Secondary | ICD-10-CM | POA: Insufficient documentation

## 2017-09-21 DIAGNOSIS — J3489 Other specified disorders of nose and nasal sinuses: Secondary | ICD-10-CM | POA: Insufficient documentation

## 2017-09-21 NOTE — Progress Notes (Signed)
Please place orders in Epic as patient is being scheduled for a pre-op appointment! Thank you! 

## 2017-09-21 NOTE — Progress Notes (Signed)
[place] OFFICE PROGRESS NOTE   Diagnosis: CLL  INTERVAL HISTORY:   Meagan Walsh returns as scheduled.  She underwent a ultrasound-guided biopsy of the large left inguinal lymph node on 09/18/2017.  She reports tolerating the procedure well.  10-15 cc of fluid were removed adjacent to the lymph node.  5 core biopsies were obtained. The pathology ([id number]) revealed an atypical lymphoid proliferation with extensive necrosis.  Necrotic areas with epithelioid histiocytes were noted.  In the intact lymphoid tissue changes consistent with CLL were noted.  Flow cytometry was unsuccessful.  Immunohistochemical stains found the lymphoid tissue to have a predominance of B cells with expression of CD20, CD 79 a and CD5.  AFB and GMS stains were negative for organisms.  I discussed the case with Dr. Gari Crown.  He recommends an excisional biopsy to rule out a higher grade lymphoma.  Meagan Walsh reports feeling well.  She has a good appetite.  She has noted erythema overlying the lymph node.  No fever.  Objective:  Vital signs in last 24 hours:  Blood pressure 96/65, pulse 87, temperature 98.1 F (36.7 C), temperature source Oral, resp. rate 17, height 5\' 6"  (1.676 m), weight 127 lb 4.8 oz (57.7 kg), SpO2 100 %.     Lymphatics: 0.5 cm mobile submental node, firm approximate 3 cm lymph node in the left inguinal area with overlying erythema/induration and fluctuance  GI: The spleen tip is palpable in the left lateral abdomen    Lab Results:  Lab Results  Component Value Date   WBC 22.6 (H) 09/18/2017   HGB 11.7 (L) 09/18/2017   HCT 35.6 (L) 09/18/2017   MCV 105.0 (H) 09/18/2017   PLT 205 09/18/2017   NEUTROABS 2.3 09/18/2017    CMP     Component Value Date/Time   NA 141 09/11/2017 1602   K 4.3 09/11/2017 1602   CL 106 09/11/2017 1602   CO2 26 09/11/2017 1602   GLUCOSE 111 09/11/2017 1602   BUN 15 09/11/2017 1602   CREATININE 1.03 09/11/2017 1602   CALCIUM 10.2  09/11/2017 1602   PROT 6.7 09/11/2017 1602   ALBUMIN 4.5 09/11/2017 1602   AST 15 09/11/2017 1602   ALT 10 09/11/2017 1602   ALKPHOS 83 09/11/2017 1602   BILITOT 0.5 09/11/2017 1602   GFRNONAA 59 (L) 09/11/2017 1602   GFRAA >60 09/11/2017 1602    09/11/2017: LDH-135, IgG 209, IgM 24, IgA 15  Imaging:  [place] Core Biopsy (lymph Nodes)  Result Date: 09/18/2017 INDICATION: History of CLL, now with palpable left inguinal lymphadenopathy. Please perform ultrasound-guided biopsy for tissue diagnostic purposes. EXAM: ULTRASOUND-GUIDED BIOPSY OF LEFT INGUINAL LYMPH NODE COMPARISON:  None. MEDICATIONS: None ANESTHESIA/SEDATION: Moderate (conscious) sedation was employed during this procedure. A total of Versed 2 mg and Fentanyl 100 mcg was administered intravenously. Moderate Sedation Time: 13 minutes. The patient's level of consciousness and vital signs were monitored continuously by radiology nursing throughout the procedure under my direct supervision. COMPLICATIONS: None immediate. TECHNIQUE: Informed written consent was obtained from the patient after a discussion of the risks, benefits and alternatives to treatment. Questions regarding the procedure were encouraged and answered. Initial ultrasound scanning demonstrated enlarged (at least 2.3 x 2.6 cm lymph node within the left groin palpable with dominant inguinal lymph node seen on preceding abdominal CT performed 09/17/2017. an ultrasound image was saved for documentation purposes. The procedure was planned. A timeout was performed prior to the initiation of the procedure. The operative was prepped and draped  in the usual sterile fashion, and a sterile drape was applied covering the operative field. A timeout was performed prior to the initiation of the procedure. Local anesthesia was provided with 1% lidocaine with epinephrine. Under direct ultrasound guidance, an 18 gauge core needle device was utilized to obtain to obtain a core needle biopsy of the  dominant left inguinal lymph node under direct ultrasound guidance. Following the acquisition of the initial biopsy, approximately 10-15 cc of slightly purulent fluid was able to be expressed from the soft tissues adjacent to the lymph node. Next, 5 additional core needle biopsies were obtained under direct ultrasound guidance. All aspirated samples were placed in saline and submitted to pathology. The needle was removed and hemostasis was achieved with manual compression. Post procedure scan was negative for significant hematoma. A dressing was placed. The patient tolerated the procedure well without immediate postprocedural complication. IMPRESSION: 1. Technically successful ultrasound guided biopsy of dominant left inguinal lymph node. 2. Approximately 10 - 15 cc of slightly purulent fluid was able to be expressed from the soft tissues adjacent to the left inguinal lymph node, likely the suspected necrotic tissue associated with the lymph node demonstrated on preceding abdominal CT. Electronically Signed   By: Sandi Mariscal M.D.   On: 09/18/2017 14:53    Medications: I have reviewed the patient's current medications.   Assessment/Plan: 1.CLL- diagnosed in August 2010  Enlarged left inguinal lymph node January 2019, small neck/axillary nodes and palpable splenomegaly 09/11/2017  CTs 225 2019-3 cm necrotic appearing lymph node in the left inguinal region, borderline enlarged pelvic/retroperitoneal, chest, and axillary nodes.  Mild spinal megaly.  Ultrasound-guided biopsy of the left inguinal lymph node 09/18/2017, slightly "purulent "fluid aspirated, core biopsy is consistent with an atypical lymphoid proliferation-extensive necrosis with surrounding epithelioid histiocytes, limited intact lymphoid tissue involved with CLL 2.Hypothyroidism 3.Hepatitis B surface and core antibody positive  Disposition: Ms. Bayless appears stable.  The biopsy of the lymph node reveals necrosis with areas of involvement  with CLL.  I discussed the pathology findings and reviewed the staging CT images with [name].  The enlarged left inguinal lymph node appears out of proportion to the remaining lymph nodes seen on physical exam and CT.  I remain concerned of a separate process involving the left inguinal node.  I discussed the pathology with Dr. Gari Crown.  He recommends an excisional lymph node biopsy to exclude a high-grade lymphoma, Hodgkin's disease, and infectious or inflammatory process, and another malignancy.  Ms. Mian agrees to proceed.  I will refer her to Dr. Redmond Pulling for the lymph node biopsy.  She will return for an office visit after the lymph node biopsy.  25 minutes were spent with the patient today.  The majority of the time was used for counseling and coordination of care.  Betsy Coder, MD  09/21/2017  2:15 PM

## 2017-09-21 NOTE — Telephone Encounter (Signed)
Scheduled appt per 3/1 los - patient is aware of appt date and time.

## 2017-09-21 NOTE — Telephone Encounter (Signed)
Pt called to report her biopsy is scheduled for 3/11. Should her office visit be moved out?

## 2017-09-21 NOTE — Telephone Encounter (Signed)
Returned call to pt with appt for 10/08/17 @ 1230. She voiced understanding.

## 2017-09-21 NOTE — Telephone Encounter (Signed)
Yes, move to week of 10/08/2017

## 2017-09-24 ENCOUNTER — Ambulatory Visit: Payer: Self-pay | Admitting: General Surgery

## 2017-09-24 NOTE — Patient Instructions (Addendum)
[facility]  09/24/2017   Your procedure is scheduled on: 10/01/2017   Report to [facility] Main  Entrance   ARRIVE AT 37 AM. Have a seat in the Main Lobby. Please note there is a phone at the [employer]. Please call [phone] on that phone. Someone from Short Stay will come and get you from the Main Lobby and take you to Short Stay.  Call this number if you have problems the morning of surgery [phone]   Remember: Do not eat food or drink liquids :After Midnight.   NO SOLID FOOD AFTER MIDNIGHT THE NIGHT PRIOR TO SURGERY. NOTHING BY MOUTH EXCEPT CLEAR LIQUIDS UNTIL 3 HOURS PRIOR TO [place] SURGERY. PLEASE FINISH ENSURE DRINK PER SURGEON ORDER 3 HOURS PRIOR TO SCHEDULED SURGERY TIME WHICH NEEDS TO BE COMPLETED AT ______0430______.  Take these medicines the morning of surgery with A SIP OF WATER: Xanax if needed, Lamictal, Synthroid , Trintellix                                 You may not have any metal on your body including hair pins and              piercings  Do not wear jewelry, make-up, lotions, powders or perfumes, deodorant             Do not wear nail polish.  Do not shave  48 hours prior to surgery.                Do not bring valuables to the hospital. [place].  Contacts, dentures or bridgework may not be worn into surgery.      Patients discharged the day of surgery will not be allowed to drive home.  Name and phone number of your driver:                Please read over the following fact sheets you were given: _____________________________________________________________________             [facility] - Preparing for Surgery Before surgery, you can play an important role.  Because skin is not sterile, your skin needs to be as free of germs as possible.  You can reduce the number of germs on your skin by washing with CHG (chlorahexidine gluconate) soap before  surgery.  CHG is an antiseptic cleaner which kills germs and bonds with the skin to continue killing germs even after washing. Please DO NOT use if you have an allergy to CHG or antibacterial soaps.  If your skin becomes reddened/irritated stop using the CHG and inform your nurse when you arrive at Short Stay. Do not shave (including legs and underarms) for at least 48 hours prior to the first CHG shower.  You may shave your face/neck. Please follow these instructions carefully:  1.  Shower with CHG Soap the night before surgery and the  morning of Surgery.  2.  If you choose to wash your hair, wash your hair first as usual with your  normal  shampoo.  3.  After you shampoo, rinse your hair and body thoroughly to remove the  shampoo.  4.  Use CHG as you would any other liquid soap.  You can apply chg directly  to the skin and wash                       Gently with a scrungie or clean washcloth.  5.  Apply the CHG Soap to your body ONLY FROM THE NECK DOWN.   Do not use on face/ open                           Wound or open sores. Avoid contact with eyes, ears mouth and genitals (private parts).                       Wash face,  Genitals (private parts) with your normal soap.             6.  Wash thoroughly, paying special attention to the area where your surgery  will be performed.  7.  Thoroughly rinse your body with warm water from the neck down.  8.  DO NOT shower/wash with your normal soap after using and rinsing off  the CHG Soap.                9.  Pat yourself dry with a clean towel.            10.  Wear clean pajamas.            11.  Place clean sheets on your bed the night of your first shower and do not  sleep with pets. Day of Surgery : Do not apply any lotions/deodorants the morning of surgery.  Please wear clean clothes to the hospital/surgery center.  FAILURE TO FOLLOW THESE INSTRUCTIONS MAY RESULT IN THE CANCELLATION OF YOUR SURGERY PATIENT  SIGNATURE_________________________________  NURSE SIGNATURE__________________________________  ________________________________________________________________________

## 2017-09-25 DIAGNOSIS — Z6820 Body mass index (BMI) 20.0-20.9, adult: Secondary | ICD-10-CM | POA: Diagnosis not present

## 2017-09-25 DIAGNOSIS — Z01419 Encounter for gynecological examination (general) (routine) without abnormal findings: Secondary | ICD-10-CM | POA: Diagnosis not present

## 2017-09-27 ENCOUNTER — Encounter (HOSPITAL_COMMUNITY)
Admission: RE | Admit: 2017-09-27 | Discharge: 2017-09-27 | Disposition: A | Discharge status: Home / Self Care | Payer: BLUE CROSS/BLUE SHIELD | Source: Ambulatory Visit | Attending: General Surgery | Admitting: General Surgery

## 2017-09-27 ENCOUNTER — Other Ambulatory Visit: Payer: Self-pay

## 2017-09-27 ENCOUNTER — Encounter (HOSPITAL_COMMUNITY): Payer: Self-pay

## 2017-09-27 DIAGNOSIS — R590 Localized enlarged lymph nodes: Secondary | ICD-10-CM | POA: Diagnosis not present

## 2017-09-27 DIAGNOSIS — Z01818 Encounter for other preprocedural examination: Secondary | ICD-10-CM | POA: Insufficient documentation

## 2017-09-27 HISTORY — DX: Malignant (primary) neoplasm, unspecified: C80.1

## 2017-09-27 HISTORY — DX: Anxiety disorder, unspecified: F41.9

## 2017-09-27 HISTORY — DX: Depression, unspecified: F32.A

## 2017-09-27 HISTORY — DX: Major depressive disorder, single episode, unspecified: F32.9

## 2017-09-27 HISTORY — DX: Hypothyroidism, unspecified: E03.9

## 2017-09-27 NOTE — Progress Notes (Signed)
CBC/Diff done 09/18/2017 routed via epic to Dr Greer Pickerel.

## 2017-09-27 NOTE — Progress Notes (Signed)
09/18/2017-CBC/Diff in epic.  CT chest-09/17/17-epic

## 2017-09-29 NOTE — Anesthesia Preprocedure Evaluation (Addendum)
Anesthesia Evaluation  Patient identified by MRN, date of birth, ID band Patient awake    Reviewed: Allergy & Precautions, NPO status , Patient's Chart, lab work & pertinent test results  History of Anesthesia Complications Negative for: history of anesthetic complications  Airway Mallampati: II  TM Distance: >3 FB Neck ROM: Full    Dental  (+) Dental Advisory Given   Pulmonary neg pulmonary ROS,    breath sounds clear to auscultation       Cardiovascular + Valvular Problems/Murmurs (asymptomatic) MVP  Rhythm:Regular Rate:Normal     Neuro/Psych Anxiety Depression negative neurological ROS     GI/Hepatic negative GI ROS, (+) Hepatitis -, B  Endo/Other  Hypothyroidism   Renal/GU negative Renal ROS     Musculoskeletal  (+) Arthritis ,   Abdominal   Peds  Hematology  (+) Blood dyscrasia (CLL), ,   Anesthesia Other Findings   Reproductive/Obstetrics                            Anesthesia Physical Anesthesia Plan  ASA: II  Anesthesia Plan: General   Post-op Pain Management:    Induction: Intravenous  PONV Risk Score and Plan: 3 and Ondansetron, Dexamethasone and Scopolamine patch - Pre-op  Airway Management Planned: LMA  Additional Equipment:   Intra-op Plan:   Post-operative Plan: Extubation in OR  Informed Consent: I have reviewed the patients History and Physical, chart, labs and discussed the procedure including the risks, benefits and alternatives for the proposed anesthesia with the patient or authorized representative who has indicated his/her understanding and acceptance.   Dental advisory given  Plan Discussed with: CRNA and Surgeon  Anesthesia Plan Comments: (Plan routine monitors, GA- LMA OK)        Anesthesia Quick Evaluation

## 2017-10-01 ENCOUNTER — Encounter (HOSPITAL_COMMUNITY): Payer: Self-pay | Admitting: *Deleted

## 2017-10-01 ENCOUNTER — Encounter (HOSPITAL_COMMUNITY)
Admission: RE | Disposition: A | Discharge status: Home / Self Care | Payer: Self-pay | Source: Ambulatory Visit | Attending: General Surgery

## 2017-10-01 ENCOUNTER — Ambulatory Visit (HOSPITAL_COMMUNITY)
Admission: RE | Admit: 2017-10-01 | Discharge: 2017-10-01 | Disposition: A | Discharge status: Home / Self Care | Payer: BLUE CROSS/BLUE SHIELD | Source: Ambulatory Visit | Attending: General Surgery | Admitting: General Surgery

## 2017-10-01 ENCOUNTER — Ambulatory Visit (HOSPITAL_COMMUNITY): Payer: BLUE CROSS/BLUE SHIELD | Admitting: Anesthesiology

## 2017-10-01 DIAGNOSIS — C9110 Chronic lymphocytic leukemia of B-cell type not having achieved remission: Secondary | ICD-10-CM | POA: Diagnosis not present

## 2017-10-01 DIAGNOSIS — L041 Acute lymphadenitis of trunk: Secondary | ICD-10-CM | POA: Diagnosis not present

## 2017-10-01 DIAGNOSIS — Z8042 Family history of malignant neoplasm of prostate: Secondary | ICD-10-CM | POA: Diagnosis not present

## 2017-10-01 DIAGNOSIS — E039 Hypothyroidism, unspecified: Secondary | ICD-10-CM | POA: Diagnosis not present

## 2017-10-01 DIAGNOSIS — R599 Enlarged lymph nodes, unspecified: Secondary | ICD-10-CM | POA: Diagnosis not present

## 2017-10-01 DIAGNOSIS — F329 Major depressive disorder, single episode, unspecified: Secondary | ICD-10-CM | POA: Diagnosis not present

## 2017-10-01 DIAGNOSIS — F419 Anxiety disorder, unspecified: Secondary | ICD-10-CM | POA: Insufficient documentation

## 2017-10-01 DIAGNOSIS — M1990 Unspecified osteoarthritis, unspecified site: Secondary | ICD-10-CM | POA: Insufficient documentation

## 2017-10-01 DIAGNOSIS — F418 Other specified anxiety disorders: Secondary | ICD-10-CM | POA: Diagnosis not present

## 2017-10-01 DIAGNOSIS — Z803 Family history of malignant neoplasm of breast: Secondary | ICD-10-CM | POA: Diagnosis not present

## 2017-10-01 DIAGNOSIS — R590 Localized enlarged lymph nodes: Secondary | ICD-10-CM | POA: Diagnosis not present

## 2017-10-01 DIAGNOSIS — Z8349 Family history of other endocrine, nutritional and metabolic diseases: Secondary | ICD-10-CM | POA: Diagnosis not present

## 2017-10-01 HISTORY — PX: MELANOMA EXCISION WITH SENTINEL LYMPH NODE BIOPSY: SHX5267

## 2017-10-01 SURGERY — MELANOMA EXCISION WITH SENTINEL LYMPH NODE BIOPSY
Anesthesia: General | Laterality: Left

## 2017-10-01 MED ORDER — SCOPOLAMINE 1 MG/3DAYS TD PT72
MEDICATED_PATCH | TRANSDERMAL | Status: AC
  Filled 2017-10-01: qty 1

## 2017-10-01 MED ORDER — PROPOFOL 10 MG/ML IV BOLUS
INTRAVENOUS | Status: DC | PRN
  Administered 2017-10-01: 180 mg via INTRAVENOUS

## 2017-10-01 MED ORDER — CEFAZOLIN SODIUM-DEXTROSE 2-4 GM/100ML-% IV SOLN
2.0000 g | INTRAVENOUS | Status: AC
  Administered 2017-10-01: 2 g via INTRAVENOUS
  Filled 2017-10-01: qty 100

## 2017-10-01 MED ORDER — LIDOCAINE 2% (20 MG/ML) 5 ML SYRINGE
INTRAMUSCULAR | Status: AC
  Filled 2017-10-01: qty 5

## 2017-10-01 MED ORDER — PHENYLEPHRINE 40 MCG/ML (10ML) SYRINGE FOR IV PUSH (FOR BLOOD PRESSURE SUPPORT)
PREFILLED_SYRINGE | INTRAVENOUS | Status: DC | PRN
  Administered 2017-10-01 (×3): 80 ug via INTRAVENOUS

## 2017-10-01 MED ORDER — 0.9 % SODIUM CHLORIDE (POUR BTL) OPTIME
TOPICAL | Status: DC | PRN
  Administered 2017-10-01: 1000 mL

## 2017-10-01 MED ORDER — FENTANYL CITRATE (PF) 100 MCG/2ML IJ SOLN
INTRAMUSCULAR | Status: DC | PRN
  Administered 2017-10-01: 50 ug via INTRAVENOUS

## 2017-10-01 MED ORDER — MIDAZOLAM HCL 5 MG/5ML IJ SOLN
INTRAMUSCULAR | Status: DC | PRN
  Administered 2017-10-01: 2 mg via INTRAVENOUS

## 2017-10-01 MED ORDER — LACTATED RINGERS IV SOLN
INTRAVENOUS | Status: DC | PRN
  Administered 2017-10-01: 07:00:00 via INTRAVENOUS

## 2017-10-01 MED ORDER — FENTANYL CITRATE (PF) 100 MCG/2ML IJ SOLN
INTRAMUSCULAR | Status: AC
  Filled 2017-10-01: qty 2

## 2017-10-01 MED ORDER — ACETAMINOPHEN 500 MG PO TABS
1000.0000 mg | ORAL_TABLET | ORAL | Status: AC
  Administered 2017-10-01: 1000 mg via ORAL
  Filled 2017-10-01: qty 2

## 2017-10-01 MED ORDER — FENTANYL CITRATE (PF) 100 MCG/2ML IJ SOLN: 25.0000 ug | INTRAMUSCULAR | Status: DC | PRN

## 2017-10-01 MED ORDER — OXYCODONE HCL 5 MG PO TABS: 5.0000 mg | ORAL_TABLET | Freq: Four times a day (QID) | ORAL | 0 refills | Status: DC | PRN

## 2017-10-01 MED ORDER — PROPOFOL 10 MG/ML IV BOLUS
INTRAVENOUS | Status: AC
  Filled 2017-10-01: qty 20

## 2017-10-01 MED ORDER — DEXAMETHASONE SODIUM PHOSPHATE 10 MG/ML IJ SOLN
INTRAMUSCULAR | Status: AC
  Filled 2017-10-01: qty 1

## 2017-10-01 MED ORDER — MIDAZOLAM HCL 2 MG/2ML IJ SOLN
INTRAMUSCULAR | Status: AC
  Filled 2017-10-01: qty 2

## 2017-10-01 MED ORDER — MIDAZOLAM HCL 2 MG/2ML IJ SOLN: 0.5000 mg | Freq: Once | INTRAMUSCULAR | Status: DC | PRN

## 2017-10-01 MED ORDER — BUPIVACAINE-EPINEPHRINE (PF) 0.5% -1:200000 IJ SOLN
INTRAMUSCULAR | Status: AC
  Filled 2017-10-01: qty 30

## 2017-10-01 MED ORDER — PROMETHAZINE HCL 25 MG/ML IJ SOLN: 6.2500 mg | INTRAMUSCULAR | Status: DC | PRN

## 2017-10-01 MED ORDER — BUPIVACAINE-EPINEPHRINE 0.5% -1:200000 IJ SOLN
INTRAMUSCULAR | Status: DC | PRN
  Administered 2017-10-01: 10 mL

## 2017-10-01 MED ORDER — SODIUM CHLORIDE 0.9 % IV SOLN
INTRAVENOUS | Status: AC
  Filled 2017-10-01: qty 500000

## 2017-10-01 MED ORDER — CHLORHEXIDINE GLUCONATE 4 % EX LIQD: 60.0000 mL | Freq: Once | CUTANEOUS | Status: DC

## 2017-10-01 MED ORDER — BUPIVACAINE HCL (PF) 0.25 % IJ SOLN
INTRAMUSCULAR | Status: AC
  Filled 2017-10-01: qty 30

## 2017-10-01 MED ORDER — GABAPENTIN 300 MG PO CAPS
300.0000 mg | ORAL_CAPSULE | ORAL | Status: AC
  Administered 2017-10-01: 300 mg via ORAL
  Filled 2017-10-01: qty 1

## 2017-10-01 MED ORDER — MEPERIDINE HCL 50 MG/ML IJ SOLN: 6.2500 mg | INTRAMUSCULAR | Status: DC | PRN

## 2017-10-01 MED ORDER — LACTATED RINGERS IV SOLN: INTRAVENOUS | Status: DC

## 2017-10-01 MED ORDER — ONDANSETRON HCL 4 MG/2ML IJ SOLN
INTRAMUSCULAR | Status: AC
Start: 2017-10-01 — End: 2017-10-01
  Filled 2017-10-01: qty 2

## 2017-10-01 MED ORDER — DEXAMETHASONE SODIUM PHOSPHATE 10 MG/ML IJ SOLN
INTRAMUSCULAR | Status: DC | PRN
  Administered 2017-10-01: 10 mg via INTRAVENOUS

## 2017-10-01 MED ORDER — BACITRACIN-NEOMYCIN-POLYMYXIN 400-5-5000 EX OINT
TOPICAL_OINTMENT | CUTANEOUS | Status: AC
  Filled 2017-10-01: qty 1

## 2017-10-01 MED ORDER — BACITRACIN-NEOMYCIN-POLYMYXIN OINTMENT TUBE
TOPICAL_OINTMENT | CUTANEOUS | Status: DC | PRN
  Administered 2017-10-01: 1 via TOPICAL

## 2017-10-01 MED ORDER — LIDOCAINE 2% (20 MG/ML) 5 ML SYRINGE
INTRAMUSCULAR | Status: DC | PRN
  Administered 2017-10-01: 80 mg via INTRAVENOUS

## 2017-10-01 MED ORDER — ONDANSETRON HCL 4 MG/2ML IJ SOLN
INTRAMUSCULAR | Status: DC | PRN
  Administered 2017-10-01: 4 mg via INTRAVENOUS

## 2017-10-01 MED ORDER — SCOPOLAMINE 1 MG/3DAYS TD PT72
MEDICATED_PATCH | TRANSDERMAL | Status: DC | PRN
  Administered 2017-10-01: 1 via TRANSDERMAL

## 2017-10-01 SURGICAL SUPPLY — 37 items
ADH SKN CLS APL DERMABOND .7 (GAUZE/BANDAGES/DRESSINGS)
APL SKNCLS STERI-STRIP NONHPOA (GAUZE/BANDAGES/DRESSINGS)
BENZOIN TINCTURE PRP APPL 2/3 (GAUZE/BANDAGES/DRESSINGS) IMPLANT
COVER SURGICAL LIGHT HANDLE (MISCELLANEOUS) ×2 IMPLANT
DECANTER SPIKE VIAL GLASS SM (MISCELLANEOUS) IMPLANT
DERMABOND ADVANCED (GAUZE/BANDAGES/DRESSINGS)
DERMABOND ADVANCED .7 DNX12 (GAUZE/BANDAGES/DRESSINGS) IMPLANT
DRAPE LAPAROSCOPIC ABDOMINAL (DRAPES) IMPLANT
DRAPE LAPAROTOMY T 102X78X121 (DRAPES) IMPLANT
DRAPE LAPAROTOMY T 98X78 PEDS (DRAPES) IMPLANT
DRAPE LAPAROTOMY TRNSV 102X78 (DRAPE) IMPLANT
DRAPE SHEET LG 3/4 BI-LAMINATE (DRAPES) IMPLANT
DRAPE UTILITY XL STRL (DRAPES) ×2 IMPLANT
ELECT REM PT RETURN 15FT ADLT (MISCELLANEOUS) ×2 IMPLANT
GAUZE PACKING IODOFORM 1/4X15 (GAUZE/BANDAGES/DRESSINGS) IMPLANT
GAUZE SPONGE 4X4 12PLY STRL (GAUZE/BANDAGES/DRESSINGS) ×2 IMPLANT
GLOVE BIO SURGEON STRL SZ7.5 (GLOVE) ×2 IMPLANT
GLOVE INDICATOR 8.0 STRL GRN (GLOVE) ×2 IMPLANT
GOWN STRL REUS W/TWL XL LVL3 (GOWN DISPOSABLE) ×4 IMPLANT
KIT BASIN OR (CUSTOM PROCEDURE TRAY) ×2 IMPLANT
MARKER SKIN DUAL TIP RULER LAB (MISCELLANEOUS) IMPLANT
NEEDLE HYPO 25X1 1.5 SAFETY (NEEDLE) ×2 IMPLANT
PACK GENERAL/GYN (CUSTOM PROCEDURE TRAY) ×2 IMPLANT
SPONGE LAP 18X18 X RAY DECT (DISPOSABLE) IMPLANT
SPONGE LAP 4X18 X RAY DECT (DISPOSABLE) IMPLANT
STAPLER VISISTAT 35W (STAPLE) IMPLANT
SUT MNCRL AB 4-0 PS2 18 (SUTURE) IMPLANT
SUT PROLENE 3 0 SH 48 (SUTURE) ×1 IMPLANT
SUT VIC AB 3-0 SH 18 (SUTURE) IMPLANT
SUT VIC AB 3-0 SH 27 (SUTURE) ×2
SUT VIC AB 3-0 SH 27XBRD (SUTURE) IMPLANT
SWAB COLLECTION DEVICE MRSA (MISCELLANEOUS) IMPLANT
SWAB CULTURE ESWAB REG 1ML (MISCELLANEOUS) IMPLANT
SYR CONTROL 10ML LL (SYRINGE) ×2 IMPLANT
TAPE CLOTH SURG 4X10 WHT LF (GAUZE/BANDAGES/DRESSINGS) ×1 IMPLANT
TOWEL OR 17X26 10 PK STRL BLUE (TOWEL DISPOSABLE) ×2 IMPLANT
TOWEL OR NON WOVEN STRL DISP B (DISPOSABLE) ×2 IMPLANT

## 2017-10-01 NOTE — Interval H&P Note (Signed)
History and Physical Interval Note:  10/01/2017 7:40 AM  Meagan Walsh  has presented today for surgery, with the diagnosis of ENLARGED LYMPH NODE, HISTORY OF CLL  The various methods of treatment have been discussed with the patient and family. After consideration of risks, benefits and other options for treatment, the patient has consented to  Procedure(s): EXCISIONAL BIOPSY OF LEFT INGUINAL LYMPH NODE (Left) as a surgical intervention .  The patient's history has been reviewed, patient examined, no change in status, stable for surgery.  I have reviewed the patient's chart and labs.  Questions were answered to the patient's satisfaction.    Patient underwent ultrasound-guided biopsy of the lesion by radiology.  Biopsy results were inconclusive.  They did get a fair amount of fluid.  Today the area is much smaller on exam then in the clinic.  It is softer.  It is probably 2-1/2 cm x 2 cm.  I think we can try to perform an excisional biopsy as opposed to an incisional biopsy.  But because radiology did get a fair amount of purulent type fluid we did talk about the increased risk of wound infection and wound issues.  All questions asked and answered  Greer Pickerel

## 2017-10-01 NOTE — Anesthesia Procedure Notes (Signed)
Procedure Name: LMA Insertion Date/Time: 10/01/2017 7:50 AM Performed by: Victoriano Lain, CRNA Pre-anesthesia Checklist: Patient identified, Emergency Drugs available, Suction available, Patient being monitored and Timeout performed Patient Re-evaluated:Patient Re-evaluated prior to induction Oxygen Delivery Method: Circle system utilized Preoxygenation: Pre-oxygenation with 100% oxygen Induction Type: IV induction Ventilation: Mask ventilation without difficulty LMA: LMA with gastric port inserted LMA Size: 4.0 Number of attempts: 1 Placement Confirmation: positive ETCO2 and breath sounds checked- equal and bilateral Tube secured with: Tape Dental Injury: Teeth and Oropharynx as per pre-operative assessment

## 2017-10-01 NOTE — Discharge Instructions (Signed)
[facility]   POST OP INSTRUCTIONS  Always review your discharge instruction sheet given to you by the facility where your surgery was performed. IF YOU HAVE DISABILITY OR FAMILY LEAVE FORMS, YOU MUST BRING THEM TO THE OFFICE FOR PROCESSING.   DO NOT GIVE THEM TO YOUR DOCTOR.  1. FIRST, take acetaminophen (Tylenol) &/or ibuprofen (Advil) for pain. Follow package directions. If still have pain after taking both tylenol and ibuprofen you may then take the oxycodone- follow prescription instructions.  2. A  prescription for pain medication may be given to you upon discharge.  Take your pain medication as prescribed, if needed. If you need a refill on your pain medication, please contact your pharmacy.  They will contact our office to request authorization. Prescriptions will not be filled after 5 pm or on week-ends. 3. You should follow a light diet the first 24 hours after arrival home, such as soup and crackers, etc.  Be sure to include lots of fluids daily.  Resume your normal diet the day after surgery. 4. Most patients will experience some swelling and bruising around the umbilicus or in the groin and scrotum.  Ice packs and reclining will help.  Swelling and bruising can take several days to resolve.  5. It is common to experience some constipation if taking pain medication after surgery.  Increasing fluid intake and taking a stool softener (such as Colace) will usually help or prevent this problem from occurring.  A mild laxative (Milk of Magnesia or Miralax) should be taken according to package directions if there are no bowel movements after 48 hours. 6. Unless discharge instructions indicate otherwise, you may remove your bandages 48 hours after surgery, and you may shower at that time.  You  have sutures closing your incision. Your incision can get in the wet in the shower. Pat dry and cover with dry gauze and minimal tape. Change daily.   Any sutures or staples will be  removed at the office during your follow-up visit. 7. ACTIVITIES:  You may resume regular (light) daily activities beginning the next day--such as daily self-care, walking, climbing stairs--gradually increasing activities as tolerated.  You may have sexual intercourse when it is comfortable.  Refrain from any heavy lifting or straining until approved by your doctor. a. You may drive when you are no longer taking prescription pain medication, you can comfortably wear a seatbelt, and you can safely maneuver your car and apply brakes. b. RETURN TO WORK:  8. You should see your doctor in the office for a follow-up appointment approximately 2-3 weeks after your surgery.  Make sure that you call for this appointment within a day or two after you arrive home to insure a convenient appointment time. 9. OTHER INSTRUCTIONS:     WHEN TO CALL YOUR DOCTOR: 1. Fever over 101.0 2. Inability to urinate 3. Nausea and/or vomiting 4. Extreme swelling or bruising 5. Continued bleeding from incision. 6. Increased pain, redness, or drainage from the incision  The clinic staff is available to answer your questions during regular business hours.  Please dont hesitate to call and ask to speak to one of the nurses for clinical concerns.  If you have a medical emergency, go to the nearest emergency room or call 911.  A surgeon from [facility] is always on call at the hospital   [address] [place], [place], [place]  [address] ?  [address], [place],    [address] [phone] ?  [phone] ? FAX [phone] Web site: [email or web address]

## 2017-10-01 NOTE — Anesthesia Postprocedure Evaluation (Signed)
Anesthesia Post Note  Patient: Meagan Walsh  Procedure(s) Performed: EXCISIONAL BIOPSY OF LEFT INGUINAL LYMPH NODE (Left )     Patient location during evaluation: PACU Anesthesia Type: General Level of consciousness: awake and alert, patient cooperative and oriented Pain management: pain level controlled Vital Signs Assessment: post-procedure vital signs reviewed and stable Respiratory status: spontaneous breathing, nonlabored ventilation and respiratory function stable Cardiovascular status: blood pressure returned to baseline and stable Postop Assessment: no apparent nausea or vomiting Anesthetic complications: no    Last Vitals:  Vitals:   10/01/17 0930 10/01/17 0945  BP: 107/66 (!) 101/58  Pulse: 79 75  Resp: 14 16  Temp: 36.7 C   SpO2: 100% 99%    Last Pain:  Vitals:   10/01/17 0915  TempSrc:   PainSc: 0-No pain   Pain Goal:                 Aviyana Sonntag,E. Sheena Simonis

## 2017-10-01 NOTE — H&P (Signed)
Meagan Walsh Documented: 09/05/2017 9:08 AM Location: [facility] Patient #: [id number] DOB: [birth year] Married / Language: Meagan Walsh / Race: White Female   History of Present Illness Meagan Walsh M. Walsh Morneault MD; 09/05/2017 12:45 PM) The patient is a 57 year old female who presents with an inguinal hernia. She states she was sent by Dr Meagan Walsh because of a left inguinal hernia. She is accompanied by her husband. She states that she found a mass in her groin on January 20. It doesn't cause any sharp or significant pain. It causes some discomfort if it's rubbed against. No also bother she lays on her side. She states that she was seen by her primary care teams office is referred here for inguinal hernia. She underwent an ultrasound on January 29 which showed left inguinal lymph node measuring 2.9 x 1.9 cm. She does have a history of asymptomatic CLL. She saw Dr. Benay Walsh in consultation over 2 years ago. This was diagnosed because of a chronically elevated white blood cell count. At that time she had no lymphadenopathy. She denies any other swollen glands. She denies any nausea, vomiting, diarrhea or constipation. She denies any dysuria. She has had a prior C-section. She states that her C-section scar breakdown and it had to be revised and also had a tummy tuck at that time. She wears hormonal patches and takes medicine for hypothyroidism   Problem List/Past Medical Meagan Walsh M. 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 Meagan Pulling, MD; 09/05/2017 9:08 AM) Cesarean Section - 1   Diagnostic Studies History Meagan Walsh M. Meagan Pulling, MD; 09/05/2017 9:08 AM) Colonoscopy  1-5 years ago Mammogram  within last year Pap Smear  1-5 years ago  Social History Meagan Walsh M. Meagan Pulling, MD; 09/05/2017 9:08 AM) Alcohol use  Moderate alcohol use. Caffeine use  Coffee, Tea. Tobacco use  Never smoker.  Family  History Meagan Walsh M. Meagan Pulling, MD; 09/05/2017 9:08 AM) Breast Cancer  Mother. Prostate Cancer  Father. Thyroid problems  Mother.  Pregnancy / Birth History Meagan Walsh M. Meagan Pulling, MD; 09/05/2017 9:08 AM) Age at menarche  73 years. Age of menopause  57-50 Contraceptive History  Intrauterine device, Oral contraceptives. Gravida  2 Length (months) of breastfeeding  7-12 Maternal age  87-35 Para  3  Other Problems Meagan Walsh M. Meagan Pulling, MD; 09/05/2017 12:48 PM) Anxiety Disorder  Depression  Thyroid Disease     Review of Systems Meagan Walsh M. Meagan Shukla MD; 09/05/2017 9:08 AM) General Not Present- Appetite Loss, Chills, Fatigue, Fever, Night Sweats, Weight Gain and Weight Loss. Skin Not Present- Change in Wart/Mole, Dryness, Hives, Jaundice, New Lesions, Non-Healing Wounds, Rash and Ulcer. HEENT Present- Wears glasses/contact lenses. Not Present- Earache, Hearing Loss, Hoarseness, Nose Bleed, Oral Ulcers, Ringing in the Ears, Seasonal Allergies, Sinus Pain, Sore Throat, Visual Disturbances and Yellow Eyes. Respiratory Not Present- Bloody sputum, Chronic Cough, Difficulty Breathing, Snoring and Wheezing. Cardiovascular Not Present- Chest Pain, Difficulty Breathing Lying Down, Leg Cramps, Palpitations, Rapid Heart Rate, Shortness of Breath and Swelling of Extremities. Gastrointestinal Not Present- Abdominal Pain, Bloating, Bloody Stool, Change in Bowel Habits, Chronic diarrhea, Constipation, Difficulty Swallowing, Excessive gas, Gets full quickly at meals, Hemorrhoids, Indigestion, Nausea, Rectal Pain and Vomiting. Female Genitourinary Not Present- Frequency, Nocturia, Painful Urination, Pelvic Pain and Urgency. Musculoskeletal Not Present- Back Pain, Joint Pain, Joint Stiffness, Muscle Pain, Muscle Weakness and Swelling of Extremities. Neurological Not Present- Decreased Memory, Fainting, Headaches, Numbness, Seizures, Tingling, Tremor, Trouble walking and Weakness. Psychiatric Not Present-  Anxiety, Bipolar,  Change in Sleep Pattern, Depression, Fearful and Frequent crying. Endocrine Not Present- Cold Intolerance, Excessive Hunger, Hair Changes, Heat Intolerance, Hot flashes and New Diabetes. Hematology Not Present- Blood Thinners, Easy Bruising, Excessive bleeding, Gland problems, HIV and Persistent Infections.  Vitals Meagan Walsh M. Meagan Brannen MD; 09/05/2017 9:08 AM) 09/05/2017 9:08 AM Weight: 132.4 lb Height: 68in Body Surface Area: 1.72 m Body Mass Index: 20.13 kg/m  Pulse: 76 (Regular)  BP: 112/64 (Sitting, Left Arm, Standard)       Physical Exam Meagan Walsh M. Meagan Pollok MD; 09/05/2017 12:42 PM) General Mental Status-Alert. General Appearance-Consistent with stated age. Hydration-Well hydrated. Voice-Normal.  Head and Neck Head-normocephalic, atraumatic with no lesions or palpable masses. Trachea-midline. Thyroid Gland Characteristics - normal size and consistency.  Eye Eyeball - Bilateral-Extraocular movements intact. Sclera/Conjunctiva - Bilateral-No scleral icterus.  ENMT Ears -Note: normal external ears.  Mouth and Throat -Note: lips intact.   Chest and Lung Exam Chest and lung exam reveals -quiet, even and easy respiratory effort with no use of accessory muscles and on auscultation, normal breath sounds, no adventitious sounds and normal vocal resonance. Inspection Chest Wall - Normal. Back - normal.  Breast - Did not examine.  Cardiovascular Cardiovascular examination reveals -normal heart sounds, regular rate and rhythm with no murmurs and normal pedal pulses bilaterally.  Abdomen Inspection Inspection of the abdomen reveals - No Hernias. Skin - Scar - Note: old c/s and tummy tuck scars. Palpation/Percussion Palpation and Percussion of the abdomen reveal - Soft, Non Tender, No Rebound tenderness, No Rigidity (guarding) and No hepatosplenomegaly. Auscultation Auscultation of the abdomen reveals - Bowel sounds normal.  Female  Genitourinary Note: no hernia either groin. no bulge with valsalva   Peripheral Vascular Upper Extremity Palpation - Pulses bilaterally normal.  Neurologic Neurologic evaluation reveals -alert and oriented x 3 with no impairment of recent or remote memory. Mental Status-Normal.  Neuropsychiatric The patient's mood and affect are described as -normal. Judgment and Insight-insight is appropriate concerning matters relevant to self.  Musculoskeletal Normal Exam - Left-Upper Extremity Strength Normal and Lower Extremity Strength Normal. Normal Exam - Right-Upper Extremity Strength Normal and Lower Extremity Strength Normal.  Lymphatic Head & Neck  General Head & Neck Lymphatics: Bilateral - Description - Normal. Axillary - Did not examine. Femoral & Inguinal -Note: no palpable LAD in RT groin left groin - obvious mass, hard, firm, fixed, no overlying skin lesions, about 3 x 2.5 cm.     Assessment & Plan Meagan Walsh M. Nysha Koplin MD; 09/05/2017 12:48 PM) LYMPHADENOPATHY, INGUINAL (R59.0) Impression: I explained to her and her husband that she does not have an inguinal hernia. At this mass that she is feeling is in fact an enlarged inguinal lymph node. I reviewed the ultrasound imaging and discussed the case with Dr. Benay Walsh. It appears that her CLL is probably now manifesting with some lymphadenopathy. He discussed just reassurance versus core biopsy versus excisional biopsy versus a consult with him. We discussed all these options. Since she is relatively asymptomatic I recommended first starting with a consultation with Dr. Benay Walsh. Therefore after meeting with him they can decide how they would like to proceed. I did advise the patient that we may not be able to excise the entire lymph node because it may be too stuck or fixed to surrounding important structures. We did also talk about risk and benefits of excisional biopsy-debulking the lymph node such as bleeding, infection,  seroma, hematoma, lymphocele, typical recovery as well as the rare possibility of blood clot, perioperative  cardiac and pulmonary events. 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 Meagan Pulling, MD, FACS General, Bariatric, & Minimally Invasive Surgery [facility], [place]

## 2017-10-01 NOTE — Transfer of Care (Signed)
Immediate Anesthesia Transfer of Care Note  Patient: Meagan Walsh  Procedure(s) Performed: EXCISIONAL BIOPSY OF LEFT INGUINAL LYMPH NODE (Left )  Patient Location: PACU  Anesthesia Type:General  Level of Consciousness: awake, alert , oriented and patient cooperative  Airway & Oxygen Therapy: Patient Spontanous Breathing and Patient connected to face mask oxygen  Post-op Assessment: Report given to RN, Post -op Vital signs reviewed and stable and Patient moving all extremities  Post vital signs: Reviewed and stable  Last Vitals:  Vitals:   10/01/17 0602  BP: (!) 108/55  Pulse: 87  Resp: 18  Temp: 36.9 C  SpO2: 97%    Last Pain:  Vitals:   10/01/17 0602  TempSrc: Oral         Complications: No apparent anesthesia complications

## 2017-10-01 NOTE — Op Note (Signed)
10/01/2017  8:30 AM  PATIENT:  Meagan Walsh  57 y.o. female  PRE-OPERATIVE DIAGNOSIS:  ENLARGED LEFT INGUINAL LYMPH NODE, HISTORY OF CLL  POST-OPERATIVE DIAGNOSIS:  ENLARGED NECROTIC LEFT INGUINAL LYMPH NODE, HISTORY OF CLL  PROCEDURE:  Procedure(s): EXCISIONAL BIOPSY OF LEFT NECROTIC INGUINAL LYMPH NODE  SURGEON:  Surgeon(s): Greer Pickerel, MD  ASSISTANTS: none   ANESTHESIA:   general  DRAINS: none   LOCAL MEDICATIONS USED:  MARCAINE     SPECIMEN:  Source of Specimen:  LEFT INGUINAL PURULENT FLUID AND NECROTIC TISSUE  DISPOSITION OF SPECIMEN:  PATHOLOGY  COUNTS:  YES  INDICATION FOR PROCEDURE: 57 year old female with history of CLL which has been asymptomatic presented to my clinic with an enlarged left inguinal lymph node.  It was very large and hard about the size of a golf ball.  It was felt to be consistent with an enlarged inguinal lymph node.  I sent her back to her oncologist.  She had imaging as well as labs and a core biopsy.  She was found to have a 3 x 3 cm necrotic appearing lymph node on imaging.  Core biopsy was inconclusive.  They did get fluid.  Because the biopsy was inconclusive I was asked to do an excisional/incisional biopsy.  Today on exam the left inguinal lymph node is much smaller and softer and slightly fluctuant.  We rediscussed risk and benefits.  I explained that she was at higher risk for wound infection because of the findings on the core biopsy.  PROCEDURE: After obtaining informed consent and marking the appropriate area in the holding area with the patient confirming location she was taken to the OR 1.  General LMA anesthesia was established.  Sequential compression devices were placed.  Her left inguinal area and lower abdomen were prepped and draped in the usual standard surgical fashion with ChloraPrep.  She received 2 g of Ancef.  Preoperatively she did receive oral Tylenol and gabapentin.  Surgical timeout was performed.  She had a healing  puncture site slightly lateral to the mass.  The total area measured 3 cm x 2 cm.  I made an elliptical incision directly over it with a 15 blade upon getting through the deep dermis there was free expression of purulent fluid.  Some of this was aspirated and put in a specimen cup.  I was then able to essentially milk out a necrotic lymph node.  It came out in pieces.  I palpated the surrounding soft tissue and felt no residual hard mass or lump.  Some of the remaining small pieces of necrotic tissue were sharply excised with scalpel and placed in the specimen cup.  The wound was irrigated with antibiotic irrigation.  Soft tissue was reapproximated with inverted interrupted 3-0 Vicryls x2.  I then closed the skin with 5 interrupted 3-0 Prolene sutures followed by antibiotic ointment dry gauze and tape.  All needle, instrument sponge counts were correct x2.  No immediate complications.  PLAN OF CARE: Discharge to home after PACU  PATIENT DISPOSITION:  PACU - hemodynamically stable.   Delay start of Pharmacological VTE agent (>24hrs) due to surgical blood loss or risk of bleeding:  not applicable  Leighton Ruff. Redmond Pulling, MD, FACS General, Bariatric, & Minimally Invasive Surgery [facility], [place]

## 2017-10-02 ENCOUNTER — Encounter (HOSPITAL_COMMUNITY): Payer: Self-pay | Admitting: General Surgery

## 2017-10-02 LAB — ACID FAST SMEAR (AFB, MYCOBACTERIA): Acid Fast Smear: NEGATIVE

## 2017-10-03 LAB — AEROBIC CULTURE W GRAM STAIN (SUPERFICIAL SPECIMEN): Culture: NO GROWTH

## 2017-10-03 LAB — AEROBIC CULTURE  (SUPERFICIAL SPECIMEN)

## 2017-10-05 ENCOUNTER — Ambulatory Visit: Payer: BLUE CROSS/BLUE SHIELD | Admitting: Oncology

## 2017-10-08 ENCOUNTER — Ambulatory Visit (HOSPITAL_COMMUNITY)
Admission: RE | Admit: 2017-10-08 | Discharge: 2017-10-08 | Disposition: A | Discharge status: Home / Self Care | Payer: BLUE CROSS/BLUE SHIELD | Source: Ambulatory Visit | Attending: Oncology | Admitting: Oncology

## 2017-10-08 ENCOUNTER — Inpatient Hospital Stay: Payer: BLUE CROSS/BLUE SHIELD | Admitting: Oncology

## 2017-10-08 ENCOUNTER — Telehealth: Payer: Self-pay | Admitting: *Deleted

## 2017-10-08 ENCOUNTER — Telehealth: Payer: Self-pay | Admitting: Oncology

## 2017-10-08 VITALS — BP 108/57 | HR 94 | Temp 98.4°F | Resp 17 | Ht 66.0 in | Wt 126.3 lb

## 2017-10-08 DIAGNOSIS — J3489 Other specified disorders of nose and nasal sinuses: Secondary | ICD-10-CM | POA: Diagnosis not present

## 2017-10-08 DIAGNOSIS — R05 Cough: Secondary | ICD-10-CM | POA: Diagnosis not present

## 2017-10-08 DIAGNOSIS — C9110 Chronic lymphocytic leukemia of B-cell type not having achieved remission: Secondary | ICD-10-CM

## 2017-10-08 DIAGNOSIS — J181 Lobar pneumonia, unspecified organism: Secondary | ICD-10-CM | POA: Diagnosis not present

## 2017-10-08 DIAGNOSIS — E039 Hypothyroidism, unspecified: Secondary | ICD-10-CM

## 2017-10-08 DIAGNOSIS — Z681 Body mass index (BMI) 19 or less, adult: Secondary | ICD-10-CM | POA: Diagnosis not present

## 2017-10-08 DIAGNOSIS — J189 Pneumonia, unspecified organism: Secondary | ICD-10-CM | POA: Diagnosis not present

## 2017-10-08 DIAGNOSIS — C9190 Lymphoid leukemia, unspecified not having achieved remission: Secondary | ICD-10-CM | POA: Insufficient documentation

## 2017-10-08 DIAGNOSIS — K219 Gastro-esophageal reflux disease without esophagitis: Secondary | ICD-10-CM | POA: Diagnosis not present

## 2017-10-08 DIAGNOSIS — J302 Other seasonal allergic rhinitis: Secondary | ICD-10-CM | POA: Diagnosis not present

## 2017-10-08 MED ORDER — LEVOFLOXACIN 750 MG PO TABS: 750.0000 mg | ORAL_TABLET | Freq: Every day | ORAL | 0 refills | Status: DC

## 2017-10-08 NOTE — Telephone Encounter (Signed)
Appointments scheduled AVS/Calendar printed per 3/18 los °

## 2017-10-08 NOTE — Progress Notes (Signed)
Meagan Walsh OFFICE PROGRESS NOTE   Diagnosis: CLL  INTERVAL HISTORY:   Meagan Walsh returns as scheduled.  She underwent a planned excisional biopsy of the left inguinal lymph node by Dr. Redmond Pulling on 10/01/2017.  On examination the lymph node felt smaller, softer, and "fluctuant ".  Purulent fluid was expressed and there was a necrotic lymph node..  Pieces of necrotic material were excised.  AFB and fungal stains were negative.  Cultures are negative to date.  The pathology revealed extensive necrosis with limited viable lymphoid tissue.  Granulomatous inflammation is present.  Flow cytometry revealed no viable cells for testing.  Immunohistochemical stains revealed no significant cytokeratin positivity.  A Ki-67 showed low expression.  The lymphoid findings were consistent with CLL.  A high-grade process was not identified.  She reports a cough for the past 2 weeks.  There is associated rhinorrhea.  She was prescribed 4 days of prednisone by her primary physician and began treatment for "allergies "today.  No fever.  No night sweats.  No unintentional weight loss.  The left groin lymph node is no longer palpable.   Objective:  Vital signs in last 24 hours:  Blood pressure (!) 108/57, pulse 94, temperature 98.4 F (36.9 C), temperature source Oral, resp. rate 17, height '5\' 6"'  (1.676 m), weight 126 lb 4.8 oz (57.3 kg), SpO2 100 %.    HEENT: Neck without mass Lymphatics: 0.5 cm submental node, no right axillary nodes, 0.5 cm left axillary node, no right inguinal nodes.  Sutures in place at the left inguinal surgical site.  No evidence of infection.  No palpable mass. Resp: End inspiratory rhonchi at the left lower posterior chest, no respiratory distress Cardio: Regular rate and rhythm GI: No hepatosplenomegaly, nontender Vascular: No leg edema  Lab Results:  Lab Results  Component Value Date   WBC 22.6 (H) 09/18/2017   HGB 11.7 (L) 09/18/2017   HCT 35.6 (L) 09/18/2017   MCV 105.0 (H) 09/18/2017   PLT 205 09/18/2017   NEUTROABS 2.3 09/18/2017    CMP     Component Value Date/Time   NA 141 09/11/2017 1602   K 4.3 09/11/2017 1602   CL 106 09/11/2017 1602   CO2 26 09/11/2017 1602   GLUCOSE 111 09/11/2017 1602   BUN 15 09/11/2017 1602   CREATININE 1.03 09/11/2017 1602   CALCIUM 10.2 09/11/2017 1602   PROT 6.7 09/11/2017 1602   ALBUMIN 4.5 09/11/2017 1602   AST 15 09/11/2017 1602   ALT 10 09/11/2017 1602   ALKPHOS 83 09/11/2017 1602   BILITOT 0.5 09/11/2017 1602   GFRNONAA 59 (L) 09/11/2017 1602   GFRAA >60 09/11/2017 1602     Medications: I have reviewed the patient's current medications.   Assessment/Plan: 1.CLL-diagnosed in August 2010  Enlarged left inguinal lymph node January 2019, small neck/axillary nodes and palpable splenomegaly 09/11/2017  CTs on 09/17/2017-3 cm necrotic appearing lymph node in the left inguinal region, borderline enlarged pelvic/retroperitoneal, chest, and axillary nodes.  Mild spinal megaly.  Ultrasound-guided biopsy of the left inguinal lymph node 09/18/2017, slightly "purulent "fluid aspirated, core biopsy is consistent with an atypical lymphoid proliferation-extensive necrosis with surrounding epithelioid histiocytes, limited intact lymphoid tissue involved with CLL  Incisional biopsy of a necrotic/purulent left inguinal lymph node on 10/01/2017-extensive necrosis with granulomatous inflammation, small amount of viable lymphoid tissue involved with CLL, AFB and fungal stains negative 2.Hypothyroidism 3.Hepatitis B surface and core antibody positive   Disposition: Meagan Walsh has chronic lymphocytic leukemia.  She  presented with an enlarged left inguinal lymph node in January.  The lymph node was necrotic with purulent material on needle biopsy and incisional biopsy.  CLL was seen in the background lymphoid tissue, but no evidence of a high-grade lymphoma or another malignancy.  Cultures are negative to date.  I  discussed the differential diagnosis with Meagan Walsh and her husband.  She understands the lymph node may be related to an infectious/inflammatory process versus unrecognized lymphoma or another malignancy.  The plan is to follow her with close observation.  She will return for an office visit in 2 months.  She appears to have an upper respiratory infection today.  She is at risk for pneumonia and the lung exam is abnormal today.  She will be referred for a chest x-ray.  25 minutes were spent with the patient today.  The majority of the time was used for counseling and coordination of care.  Betsy Coder, MD  10/08/2017  2:11 PM

## 2017-10-08 NOTE — Telephone Encounter (Signed)
Chest Xray reviewed by Dr. Benay Spice: consistent with pneumonia. Order received for Levaquin 750mg  daily x5 days. Pt to follow up with Dr. Osborne Casco.  Called pt with result and antibiotic instructions. She understands to follow up with PCP. Copy of chest Xray routed to PCP.

## 2017-10-16 ENCOUNTER — Telehealth: Payer: Self-pay

## 2017-10-16 DIAGNOSIS — J181 Lobar pneumonia, unspecified organism: Principal | ICD-10-CM

## 2017-10-16 DIAGNOSIS — J189 Pneumonia, unspecified organism: Secondary | ICD-10-CM

## 2017-10-16 NOTE — Telephone Encounter (Signed)
Returned pt call regarding unchanged cough. Pt states she finished antibiotic course and has experienced no change in her cough. MD made aware. Pt to our clinic tomorrow for visit following chest xray. Pt aware of appts and times.

## 2017-10-17 ENCOUNTER — Ambulatory Visit (HOSPITAL_COMMUNITY)
Admission: RE | Admit: 2017-10-17 | Discharge: 2017-10-17 | Disposition: A | Discharge status: Home / Self Care | Payer: BLUE CROSS/BLUE SHIELD | Source: Ambulatory Visit | Attending: Oncology | Admitting: Oncology

## 2017-10-17 ENCOUNTER — Inpatient Hospital Stay (HOSPITAL_BASED_OUTPATIENT_CLINIC_OR_DEPARTMENT_OTHER): Payer: BLUE CROSS/BLUE SHIELD | Admitting: Oncology

## 2017-10-17 ENCOUNTER — Telehealth: Payer: Self-pay

## 2017-10-17 ENCOUNTER — Inpatient Hospital Stay: Payer: BLUE CROSS/BLUE SHIELD

## 2017-10-17 ENCOUNTER — Other Ambulatory Visit: Payer: Self-pay

## 2017-10-17 VITALS — BP 104/57 | HR 89 | Temp 98.1°F | Resp 18 | Ht 66.0 in | Wt 125.7 lb

## 2017-10-17 DIAGNOSIS — C9110 Chronic lymphocytic leukemia of B-cell type not having achieved remission: Secondary | ICD-10-CM

## 2017-10-17 DIAGNOSIS — J181 Lobar pneumonia, unspecified organism: Secondary | ICD-10-CM | POA: Insufficient documentation

## 2017-10-17 DIAGNOSIS — E039 Hypothyroidism, unspecified: Secondary | ICD-10-CM | POA: Diagnosis not present

## 2017-10-17 DIAGNOSIS — J189 Pneumonia, unspecified organism: Secondary | ICD-10-CM | POA: Diagnosis not present

## 2017-10-17 DIAGNOSIS — R05 Cough: Secondary | ICD-10-CM | POA: Diagnosis not present

## 2017-10-17 DIAGNOSIS — J3489 Other specified disorders of nose and nasal sinuses: Secondary | ICD-10-CM | POA: Diagnosis not present

## 2017-10-17 DIAGNOSIS — C9190 Lymphoid leukemia, unspecified not having achieved remission: Secondary | ICD-10-CM | POA: Diagnosis not present

## 2017-10-17 MED ORDER — BENZONATATE 100 MG PO CAPS: 100.0000 mg | ORAL_CAPSULE | Freq: Three times a day (TID) | ORAL | 0 refills | Status: DC | PRN

## 2017-10-17 NOTE — Telephone Encounter (Signed)
Returned call to pt to inform that tessalon pearls have been sent to pharmacy. Per MD, no change in antibiotics now, Infectious disease clinic will be calling with an appt. Pt voiced understanding.

## 2017-10-17 NOTE — Progress Notes (Signed)
[name] OFFICE PROGRESS NOTE   Diagnosis: CLL CLL, pneumonia  INTERVAL HISTORY:   Meagan Walsh returns prior to his scheduled visit.  When she was here on 12/08/2017 she reported a cough for 2 weeks.  The left lung exam was abnormal.  A chest x-ray revealed a left lower lobe infiltrate.  She completed a one-week course of Levaquin. The cough has persisted.  The cough is occasionally productive.  No fever or dyspnea.  She otherwise feels well.  The left groin incision is healing.  She also reports nasal congestion and rhinorrhea for several weeks.  Objective:  Vital signs in last 24 hours:  Blood pressure (!) 104/57, pulse 89, temperature 98.1 F (36.7 C), temperature source Oral, resp. rate 18, height 5\' 6"  (1.676 m), weight 125 lb 11.2 oz (57 kg), SpO2 100 %.    HEENT: Pharynx without erythema or exudate Lymphatics: Pea-sized mobile submental node.  No cervical or supraclavicular nodes.  No inguinal nodes.  The left inguinal surgical site is without evidence of infection.  No recurrent fluid collection or mass. Resp: Scattered inspiratory/expiratory rhonchi, most prominent at the left lower posterior chest, no respiratory distress Cardio: Regular rate and rhythm GI: No hepatosplenomegaly, no mass, nontender Vascular: No leg edema   Lab Results:  Lab Results  Component Value Date   WBC 22.6 (H) 09/18/2017   HGB 11.7 (L) 09/18/2017   HCT 35.6 (L) 09/18/2017   MCV 105.0 (H) 09/18/2017   PLT 205 09/18/2017   NEUTROABS 2.3 09/18/2017    CMP     Component Value Date/Time   NA 141 09/11/2017 1602   K 4.3 09/11/2017 1602   CL 106 09/11/2017 1602   CO2 26 09/11/2017 1602   GLUCOSE 111 09/11/2017 1602   BUN 15 09/11/2017 1602   CREATININE 1.03 09/11/2017 1602   CALCIUM 10.2 09/11/2017 1602   PROT 6.7 09/11/2017 1602   ALBUMIN 4.5 09/11/2017 1602   AST 15 09/11/2017 1602   ALT 10 09/11/2017 1602   ALKPHOS 83 09/11/2017 1602   BILITOT 0.5 09/11/2017 1602   GFRNONAA 59 (L) 09/11/2017 1602   GFRAA >60 09/11/2017 1602     Imaging:  Dg Chest 2 View  Result Date: 10/17/2017 CLINICAL DATA:  Left lower lobe pneumonia EXAM: CHEST - 2 VIEW COMPARISON:  10/08/2017 FINDINGS: Continued left lower lobe airspace opacity compatible with pneumonia, not significantly changed. Right lung is clear. Heart is normal size. Biapical scarring. No visible effusions or acute bony abnormality. IMPRESSION: Continued left lower lobe consolidation compatible with pneumonia, not significantly changed. Electronically Signed   By: Rolm Baptise M.D.   On: 10/17/2017 09:53  X-ray images reviewed with [name].  Medications: I have reviewed the patient's current medications.   Assessment/Plan: 1.CLL-diagnosed in August 2010  Enlarged leftinguinal lymph node January 2019,smallneck/axillary nodes and palpable splenomegaly 09/11/2017  CTs on 09/17/2017-3 cm necrotic appearing lymph node in the left inguinal region, borderline enlarged pelvic/retroperitoneal, chest, and axillary nodes. Mild spinal megaly.  Ultrasound-guided biopsy of the left inguinal lymph node 09/18/2017, slightly "purulent "fluid aspirated, core biopsy is consistent with an atypical lymphoid proliferation-extensive necrosis with surrounding epithelioid histiocytes, limited intact lymphoid tissue involved with CLL  Incisional biopsy of a necrotic/purulent left inguinal lymph node on 10/01/2017-extensive necrosis with granulomatous inflammation, small amount of viable lymphoid tissue involved with CLL, AFB and fungal stains negative 2.Hypothyroidism 3.Hepatitis B surface and core antibody positive 4.  Left lung pneumonia diagnosed 10/08/2017-completed 7 days of Levaquin  Disposition: Meagan Walsh has chronic lymphocytic leukemia.  She was diagnosed with pneumonia 10/08/2017.  She has a persistent cough despite completing 1 week of Levaquin.  There is a persistent left lower lobe infiltrate.  She most  likely has a viral syndrome, but it is possible she has an atypical pneumonia in the setting of CLL.  She reports travel to [place] in December 2018.  Granulomatous inflammation was identified on the left inguinal lymph node biopsy this month.  Cultures are negative to date.  We submitted a sputum sample for routine culture and a respiratory panel today.  I contacted the infectious disease service.  They will schedule an outpatient appointment.  She will return as scheduled in May.  We will see her sooner as needed pending the infectious disease evaluation.  25 minutes were spent with the patient today.  The majority of the time was used for counseling and coordination of care.  Betsy Coder, MD  10/17/2017  10:39 AM

## 2017-10-17 NOTE — Addendum Note (Signed)
Addended by: Mathis Fare on: 10/17/2017 03:53 PM   Modules accepted: Orders

## 2017-10-19 DIAGNOSIS — R87612 Low grade squamous intraepithelial lesion on cytologic smear of cervix (LGSIL): Secondary | ICD-10-CM | POA: Diagnosis not present

## 2017-10-19 DIAGNOSIS — N870 Mild cervical dysplasia: Secondary | ICD-10-CM | POA: Diagnosis not present

## 2017-10-19 LAB — CULTURE, RESPIRATORY W GRAM STAIN: Culture: NORMAL

## 2017-10-19 LAB — RESPIRATORY VIRUS PANEL
Adenovirus: NEGATIVE
Influenza A: NEGATIVE
Influenza B: NEGATIVE
Metapneumovirus: NEGATIVE
Parainfluenza 1: NEGATIVE
Parainfluenza 2: NEGATIVE
Parainfluenza 3: NEGATIVE
Respiratory Syncytial Virus A: NEGATIVE
Respiratory Syncytial Virus B: NEGATIVE
Rhinovirus: NEGATIVE

## 2017-10-19 LAB — CULTURE, RESPIRATORY

## 2017-10-23 ENCOUNTER — Encounter: Payer: Self-pay | Admitting: Infectious Diseases

## 2017-10-23 ENCOUNTER — Ambulatory Visit: Payer: BLUE CROSS/BLUE SHIELD | Admitting: Infectious Diseases

## 2017-10-23 DIAGNOSIS — C9190 Lymphoid leukemia, unspecified not having achieved remission: Secondary | ICD-10-CM

## 2017-10-23 DIAGNOSIS — J181 Lobar pneumonia, unspecified organism: Secondary | ICD-10-CM

## 2017-10-23 DIAGNOSIS — C9110 Chronic lymphocytic leukemia of B-cell type not having achieved remission: Secondary | ICD-10-CM

## 2017-10-23 DIAGNOSIS — J189 Pneumonia, unspecified organism: Secondary | ICD-10-CM | POA: Insufficient documentation

## 2017-10-23 NOTE — Assessment & Plan Note (Deleted)
Completion of Levaquin She has had some improvement after course of Levaquin over the last week.

## 2017-10-23 NOTE — Progress Notes (Signed)
Patient: Meagan Walsh  DOB: December 24, 1960 MRN: [id number] PCP: Meagan Pao, MD  Referring Provider: Dr. Benay Walsh  Patient Active Problem List   Diagnosis Date Noted  . CLL (chronic lymphocytic leukemia) ([place]) 10/24/2017  . Pneumonia 10/23/2017  . Travel advice encounter 06/23/2016     Subjective:  Meagan Walsh is a 57 y.o. female with history detailed below but significant for chronic lymphocytic leukemia (stage 0) with recent treatment for symptoms and chest xray consistent with pneumonia and now persistent infiltrate on repeat chest xray. She was referred by Dr. Benay Walsh for evaluation.   Meagan Walsh had routine follow up visit with Dr. Benay Walsh on 10/08/17; at that time she reported 2 weeks of productive cough and associated rhinorrhea. During the course of her illness she never had any associated fevers, night sweats, malaise, weight loss, chest pain, shortness of breath or wheezing. Chest xray revealed organized left lower lobe infiltrate consistent with pneumonia. Sputum assessment was negative for predominating organism, respiratory panel was also negative. She was given a 5 day course of Levaquin 750 mg. At 2 week follow up visit on 10/17/17 where she described her cough to be persistent, occasionally productive. Still no fevers or dyspnea and otherwise felt well but still had some nasal congestion/rhinorrhea. Follow up chest x ray revealed persistent infiltrate.   Today she tells me she is "feeling just fine." She previously was coughing "a lot" a few weeks ago and was more often productive of white sputum with worsened nasal congestion/rhinorrhea prior to antibiotic course. She thought she may have some allergy contribution and has started on "allergy medicine." She has noticed an improvement in her cough to where she only experiences it with deep breaths and it is almost now exclusively dry. No other symptoms that are worrisome for infection per her account. She goes on to  tell me that she never gets sick and this is the first time in "a very long time" she recalls having an illness.   In brief regarding her CLL, she has not at this point required treatment in the years she has been diagnosed (2010). In January of 2019 she developed an enlarged lymph node in her left groin, palpable neck/axillary nodes and splenomegaly. Recently underwent left inguinal lymph node excisional biopsy that was enlarged. Purulent fluid expressed and the node was necrotic; no cultures or stains were positive and pathology with granulomatous inflammation, extensive necrosis and low-grade CLL findings. IgG low at 209 and IgM low at 24 in February 2019.   Review of Systems  Constitutional: Negative for chills, diaphoresis, fever, malaise/fatigue and weight loss.  HENT: Negative for tinnitus.   Eyes: Negative for blurred vision and photophobia.  Respiratory: Positive for cough and sputum production. Negative for shortness of breath and wheezing.   Cardiovascular: Negative for chest pain.  Gastrointestinal: Negative for diarrhea, nausea and vomiting.  Genitourinary: Negative for dysuria.  Skin: Negative for rash.  Neurological: Negative for headaches.    Past Medical History:  Diagnosis Date  . Anxiety   . Cancer ([place])    CLL  . Degenerative disk disease    Neck--chronic pain  . Depression   . Fibroids    Uterine  . 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 Hypothyroidism   . Lymphocytosis   . Mitral valve prolapse   . Mitral valve prolapse   . Thyroid disease    Hypothyroid    Outpatient Medications Prior to Visit  Medication  Sig Dispense Refill  . ALPRAZolam (XANAX) 0.25 MG tablet Take 0.25-0.5 mg by mouth daily as needed. For anxiety  1  . benzonatate (TESSALON) 100 MG capsule Take 1 capsule (100 mg total) by mouth 3 (three) times daily as needed for cough. 60 capsule 0  . lithium carbonate (ESKALITH) 450 MG CR tablet Take 900 mg by mouth at  bedtime.  1  . naproxen sodium (ALEVE) 220 MG tablet Take 220 mg by mouth 2 (two) times daily as needed (for pain.).    [name] vortioxetine HBr (TRINTELLIX) 20 MG TABS Take 20 mg by mouth daily.     [name] zolpidem (AMBIEN) 10 MG tablet Take 5 mg by mouth at bedtime.     . lamoTRIgine (LAMICTAL) 200 MG tablet Take 200 mg by mouth 2 (two) times daily.  0  . levofloxacin (LEVAQUIN) 750 MG tablet Take 1 tablet (750 mg total) by mouth daily. (Patient not taking: Reported on 10/23/2017) 5 tablet 0  . levonorgestrel (MIRENA) 20 MCG/24HR IUD 1 each by Intrauterine route once.     [name] levothyroxine (SYNTHROID, LEVOTHROID) 100 MCG tablet Take 100 mcg by mouth daily.    [name] oxyCODONE (OXY IR/ROXICODONE) 5 MG immediate release tablet Take 1 tablet (5 mg total) by mouth every 6 (six) hours as needed for breakthrough pain. (Patient not taking: Reported on 10/23/2017) 10 tablet 0  . VENTOLIN HFA 108 (90 Base) MCG/ACT inhaler USE 2 PUFFS 4 TIMES A DAY AS NEEDED FOR COUGH  0   No facility-administered medications prior to visit.      No Known Allergies  Social History   Tobacco Use  . Smoking status: Never Smoker  . Smokeless tobacco: Never Used  Substance Use Topics  . Alcohol use: Yes    Frequency: Never    Comment: 2 glasses of wine 2 times per week   . Drug use: No    Family History  Problem Relation Age of Onset  . Breast cancer Mother     Objective:   Vitals:   10/23/17 1439  BP: 110/71  Pulse: 71  Temp: (!) 97 F (36.1 C)  TempSrc: Oral  Weight: 127 lb (57.6 kg)   Body mass index is 20.5 kg/m.  Physical Exam  Constitutional: She is oriented to person, place, and time and well-developed, well-nourished, and in no distress.  Seated comfortably in chair. Good spirits today.  HENT:  Mouth/Throat: No oral lesions. No dental abscesses.  Cardiovascular: Normal rate, regular rhythm and normal heart sounds.  Pulmonary/Chest: Effort normal. She has rales (LLL).  No demonstrated cough during  conversation until deep inspiration.   Abdominal: Soft. She exhibits no distension. There is no tenderness.  Musculoskeletal: Normal range of motion. She exhibits no tenderness.  Lymphadenopathy:    She has no cervical adenopathy.    She has no axillary adenopathy.  Neurological: She is alert and oriented to person, place, and time.  Skin: Skin is warm and dry. No rash noted.  Psychiatric: Mood, affect and judgment normal.  Vitals reviewed.   Lab Results: Lab Results  Component Value Date   WBC 22.6 (H) 09/18/2017   HGB 11.7 (L) 09/18/2017   HCT 35.6 (L) 09/18/2017   MCV 105.0 (H) 09/18/2017   PLT 205 09/18/2017    Lab Results  Component Value Date   CREATININE 1.03 09/11/2017   BUN 15 09/11/2017   NA 141 09/11/2017   K 4.3 09/11/2017   CL 106 09/11/2017   CO2 26 09/11/2017  Lab Results  Component Value Date   ALT 10 09/11/2017   AST 15 09/11/2017   ALKPHOS 83 09/11/2017   BILITOT 0.5 09/11/2017     Assessment & Plan:   Problem List Items Addressed This Visit      Respiratory   Pneumonia    Still with some rales on exam over posterior left chest that cleared with cough and now rhinorrhea/ sinus symptoms following appropriate treatment with Levaquin. Sputum analysis was negative and respiratory viral panel negative. She has no fevers and reports some slow clinical improvements.   Would continue to observe off antibiotics barring she continues to have improvements. Would recommend follow up with repeated chest x-ray in 3 weeks to follow up on infiltrate. If this persists would recommend consideration CT scan to evaluate further and possibly consideration for pulmonary evaluation.   Her IgG and IgM were low in February 2019 - ? 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 She has been fairly unaffected by her CLL and has only required monitoring up until now and  hopefully this will still be the case.   It was a pleasure meeting [name] and we are happy to follow up with her as needed for now.         Other   CLL (chronic lymphocytic leukemia) ([place])     Janene Madeira, MSN, NP-C [facility] for Infectious [place] Pager: [phone] Office: [phone]

## 2017-10-24 DIAGNOSIS — C9110 Chronic lymphocytic leukemia of B-cell type not having achieved remission: Secondary | ICD-10-CM | POA: Insufficient documentation

## 2017-10-29 DIAGNOSIS — F332 Major depressive disorder, recurrent severe without psychotic features: Secondary | ICD-10-CM | POA: Diagnosis not present

## 2017-10-30 ENCOUNTER — Telehealth: Payer: Self-pay

## 2017-10-30 DIAGNOSIS — J181 Lobar pneumonia, unspecified organism: Principal | ICD-10-CM

## 2017-10-30 DIAGNOSIS — J189 Pneumonia, unspecified organism: Secondary | ICD-10-CM

## 2017-10-30 NOTE — Telephone Encounter (Signed)
Spoke with pt. Per pt , surgeon Greer Pickerel recommends that pt have chest xray done 2 weeks from date of visit. Ok per dr. Benay Spice. Order will be entered in for pt to go. Pt voiced understanding.

## 2017-10-31 LAB — FUNGUS CULTURE WITH STAIN

## 2017-10-31 LAB — FUNGUS CULTURE RESULT

## 2017-10-31 LAB — FUNGAL ORGANISM REFLEX

## 2017-11-02 NOTE — Assessment & Plan Note (Addendum)
Still with some rales on exam over posterior left chest that cleared with cough and now rhinorrhea/ sinus symptoms following appropriate treatment with Levaquin. Sputum analysis was negative and respiratory viral panel negative. She has no fevers and reports some slow clinical improvements.   Would continue to observe off antibiotics barring she continues to have improvements. Would recommend follow up with repeated chest x-ray in 3 weeks to follow up on infiltrate. If this persists would recommend consideration CT scan to evaluate further and possibly consideration for pulmonary evaluation.   Her IgG and IgM were low in February 2019 - ? 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 She has been fairly unaffected by her CLL and has only required monitoring up until now and hopefully this will still be the case.   It was a pleasure meeting [name] and we are happy to follow up with her as needed for now.

## 2017-11-06 ENCOUNTER — Ambulatory Visit (HOSPITAL_COMMUNITY)
Admission: RE | Admit: 2017-11-06 | Discharge: 2017-11-06 | Disposition: A | Discharge status: Home / Self Care | Payer: BLUE CROSS/BLUE SHIELD | Source: Ambulatory Visit | Attending: Oncology | Admitting: Oncology

## 2017-11-06 DIAGNOSIS — J181 Lobar pneumonia, unspecified organism: Secondary | ICD-10-CM | POA: Insufficient documentation

## 2017-11-06 DIAGNOSIS — J189 Pneumonia, unspecified organism: Secondary | ICD-10-CM

## 2017-11-06 DIAGNOSIS — J984 Other disorders of lung: Secondary | ICD-10-CM | POA: Diagnosis not present

## 2017-11-06 DIAGNOSIS — R05 Cough: Secondary | ICD-10-CM | POA: Diagnosis not present

## 2017-11-07 ENCOUNTER — Telehealth: Payer: Self-pay | Admitting: *Deleted

## 2017-11-07 ENCOUNTER — Telehealth: Payer: Self-pay

## 2017-11-07 NOTE — Telephone Encounter (Addendum)
Pt voiced understanding of message below  ----- Message from Ladell Pier, MD sent at 11/06/2017  6:00 PM EDT ----- Please call patient, pneumonia appears improved, call for persistent cough or fever, follow-up as scheduled

## 2017-11-07 NOTE — Telephone Encounter (Signed)
"  May I be connected to Dr. Gearldine Shown nurse?" Denies signs or symptoms for intervention at this time.

## 2017-11-13 LAB — ACID FAST CULTURE WITH REFLEXED SENSITIVITIES (MYCOBACTERIA): Acid Fast Culture: NEGATIVE

## 2017-12-05 ENCOUNTER — Telehealth: Payer: Self-pay | Admitting: Oncology

## 2017-12-05 NOTE — Telephone Encounter (Signed)
Returned call to patient confirming appointment date/time per 5/14 phone message

## 2017-12-06 ENCOUNTER — Inpatient Hospital Stay: Payer: BLUE CROSS/BLUE SHIELD | Attending: Oncology

## 2017-12-06 ENCOUNTER — Telehealth: Payer: Self-pay | Admitting: Nurse Practitioner

## 2017-12-06 ENCOUNTER — Inpatient Hospital Stay (HOSPITAL_BASED_OUTPATIENT_CLINIC_OR_DEPARTMENT_OTHER): Payer: BLUE CROSS/BLUE SHIELD | Admitting: Nurse Practitioner

## 2017-12-06 ENCOUNTER — Ambulatory Visit (HOSPITAL_COMMUNITY)
Admission: RE | Admit: 2017-12-06 | Discharge: 2017-12-06 | Disposition: A | Discharge status: Home / Self Care | Payer: BLUE CROSS/BLUE SHIELD | Source: Ambulatory Visit | Attending: Nurse Practitioner | Admitting: Nurse Practitioner

## 2017-12-06 ENCOUNTER — Encounter: Payer: Self-pay | Admitting: Nurse Practitioner

## 2017-12-06 VITALS — BP 106/69 | HR 78 | Temp 98.4°F | Resp 18 | Ht 66.0 in | Wt 123.4 lb

## 2017-12-06 DIAGNOSIS — Z8619 Personal history of other infectious and parasitic diseases: Secondary | ICD-10-CM | POA: Diagnosis not present

## 2017-12-06 DIAGNOSIS — C9190 Lymphoid leukemia, unspecified not having achieved remission: Secondary | ICD-10-CM

## 2017-12-06 DIAGNOSIS — Z8701 Personal history of pneumonia (recurrent): Secondary | ICD-10-CM | POA: Diagnosis not present

## 2017-12-06 DIAGNOSIS — R05 Cough: Secondary | ICD-10-CM | POA: Diagnosis not present

## 2017-12-06 DIAGNOSIS — C9110 Chronic lymphocytic leukemia of B-cell type not having achieved remission: Secondary | ICD-10-CM

## 2017-12-06 DIAGNOSIS — E039 Hypothyroidism, unspecified: Secondary | ICD-10-CM | POA: Diagnosis not present

## 2017-12-06 LAB — CBC WITH DIFFERENTIAL (CANCER CENTER ONLY)
Basophils Absolute: 0 10*3/uL (ref 0.0–0.1)
Basophils Relative: 0 %
Eosinophils Absolute: 0.1 10*3/uL (ref 0.0–0.5)
Eosinophils Relative: 0 %
HCT: 33.3 % — ABNORMAL LOW (ref 34.8–46.6)
Hemoglobin: 10.8 g/dL — ABNORMAL LOW (ref 11.6–15.9)
Lymphocytes Relative: 89 %
Lymphs Abs: 13.1 10*3/uL — ABNORMAL HIGH (ref 0.9–3.3)
MCH: 35.2 pg — ABNORMAL HIGH (ref 25.1–34.0)
MCHC: 32.4 g/dL (ref 31.5–36.0)
MCV: 108.5 fL — ABNORMAL HIGH (ref 79.5–101.0)
Monocytes Absolute: 0.2 10*3/uL (ref 0.1–0.9)
Monocytes Relative: 1 %
Neutro Abs: 1.4 10*3/uL — ABNORMAL LOW (ref 1.5–6.5)
Neutrophils Relative %: 10 %
Platelet Count: 125 10*3/uL — ABNORMAL LOW (ref 145–400)
RBC: 3.07 MIL/uL — ABNORMAL LOW (ref 3.70–5.45)
RDW: 14 % (ref 11.2–14.5)
WBC Count: 14.8 10*3/uL — ABNORMAL HIGH (ref 3.9–10.3)

## 2017-12-06 IMAGING — CR DG CHEST 2V
2 series · 2 of 2 positions shown · non-contrast
Comparison: [DATE]

CLINICAL DATA: LEFT lower lobe infiltrate, still some cough but
improved, history CLL, pneumonia

EXAM:
CHEST - 2 VIEW

[w chest pa]
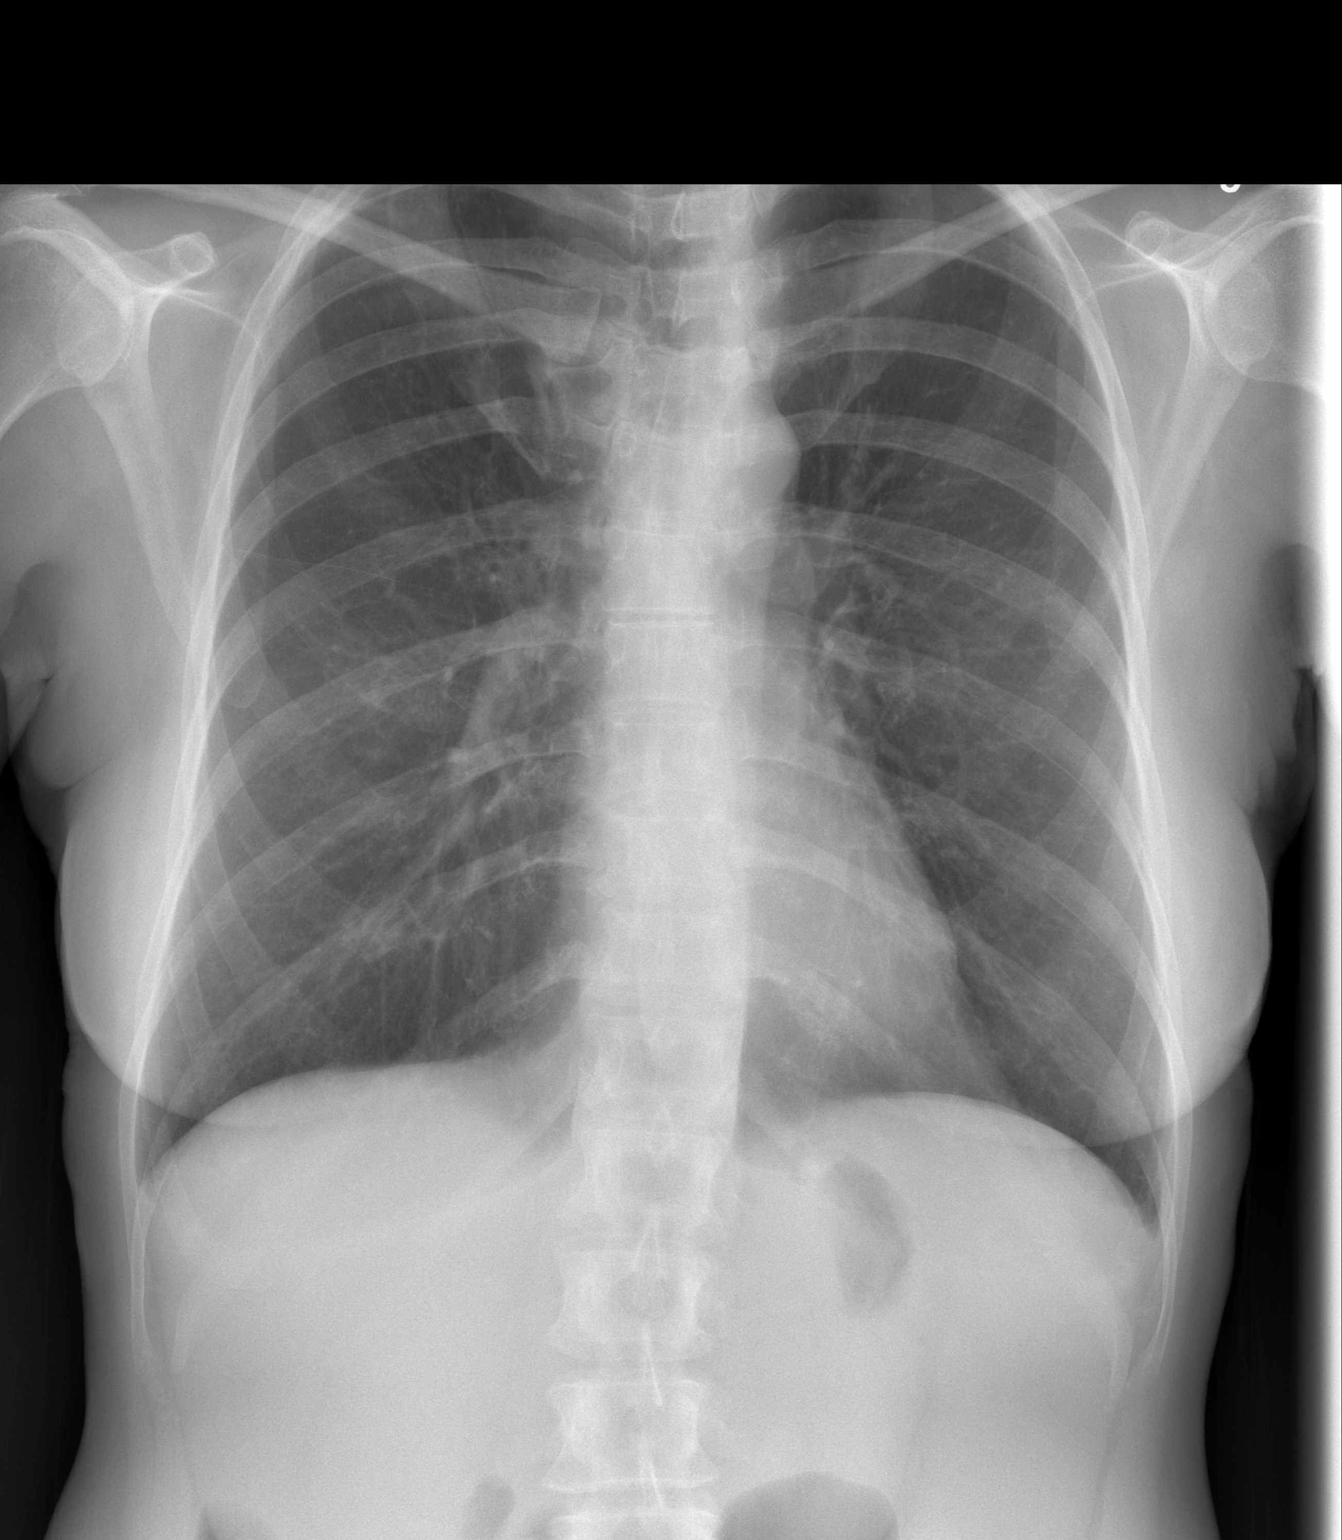

[w chest lat]
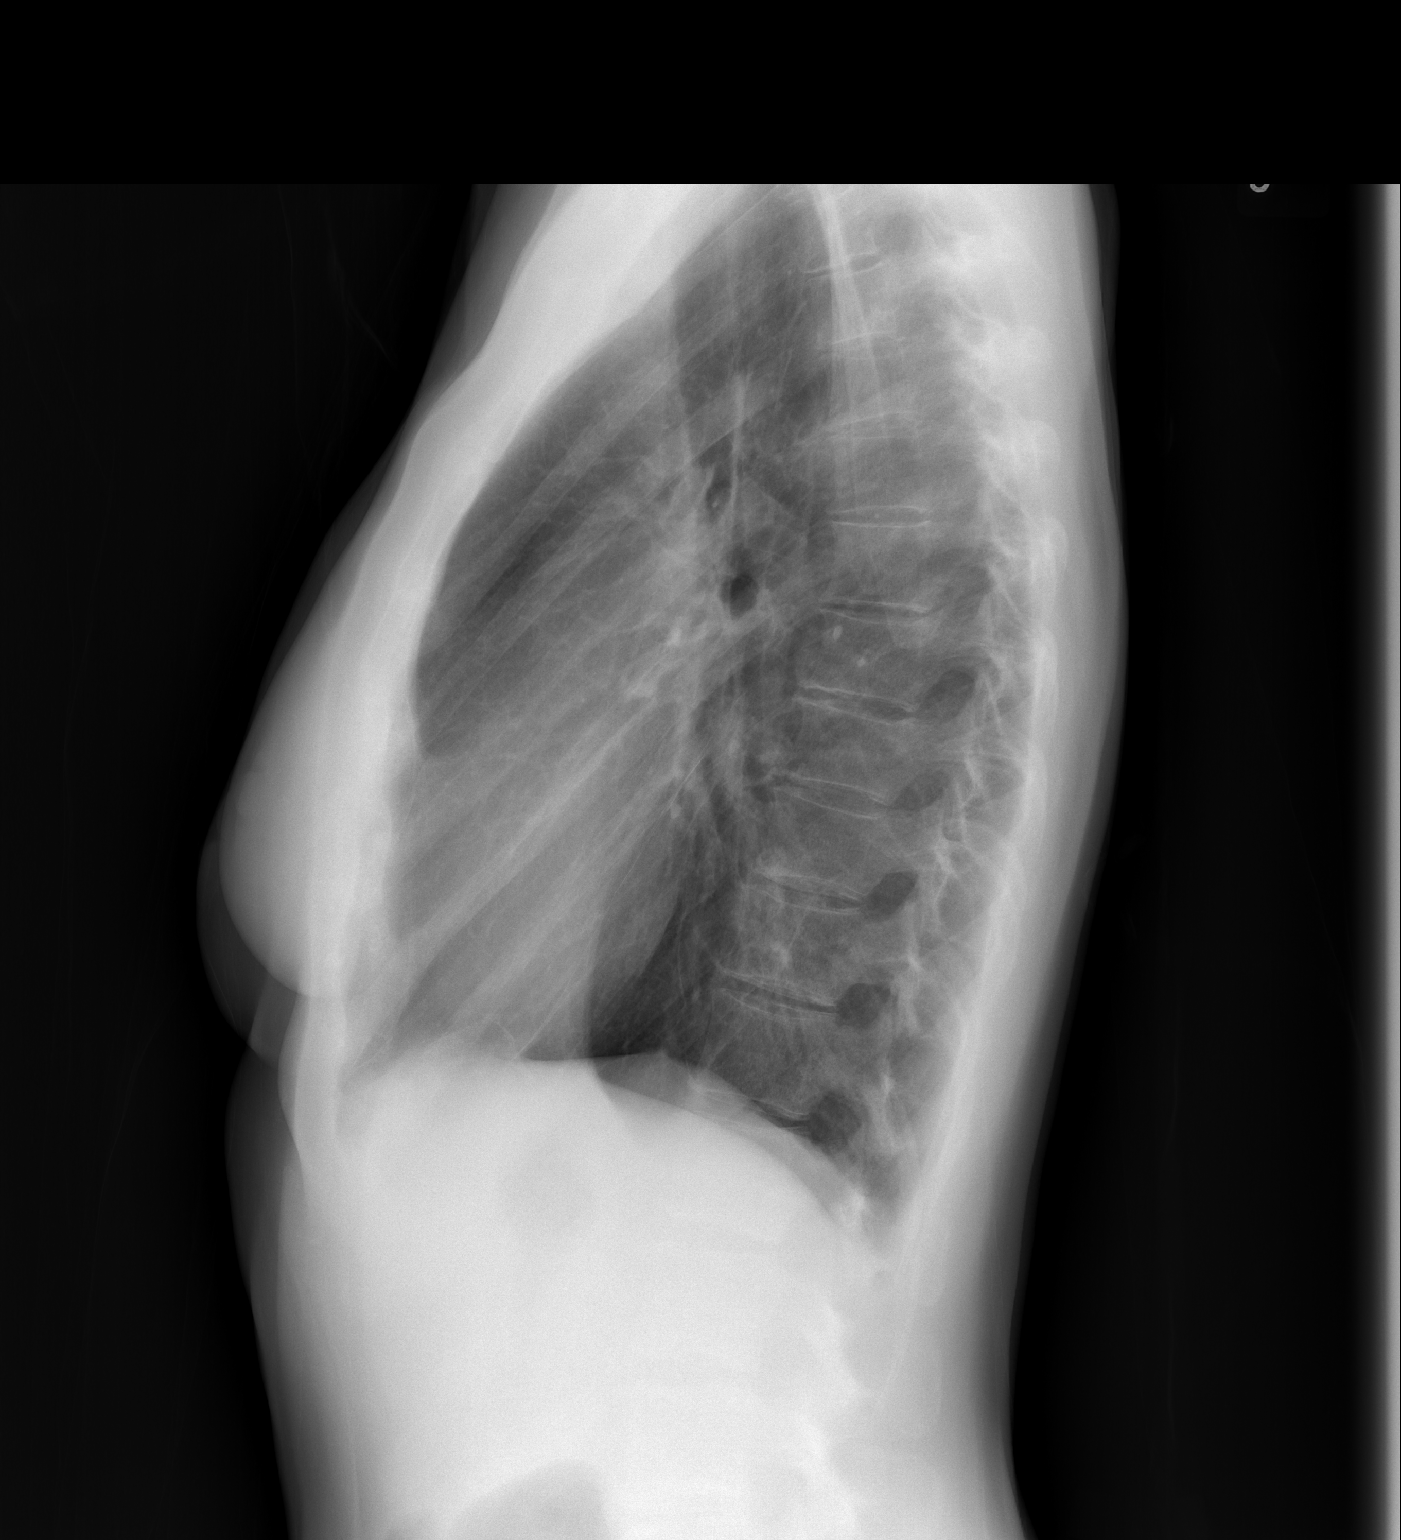

[2 of 2 positions shown; findings below may reference images not displayed]

FINDINGS: Normal heart size, mediastinal contours, and pulmonary vascularity.

Azygos fissure noted.

Lungs clear.

Resolution of LEFT lower lobe infiltrate since previous study.

No pulmonary infiltrate, pleural effusion or pneumothorax.

Bones unremarkable.
IMPRESSION: Normal exam.

## 2017-12-06 NOTE — Progress Notes (Addendum)
  [place] OFFICE PROGRESS NOTE   Diagnosis: CLL, pneumonia  INTERVAL HISTORY:   Meagan Walsh returns as scheduled.  She feels well.  She has noted significant improvement in the cough over the past 3 weeks.  No shortness of breath.  No fevers or sweats.  She has a good appetite and good energy level.  She reports the left groin incision has healed.  She has not noticed any enlarged lymph nodes.  Objective:  Vital signs in last 24 hours:  Blood pressure 106/69, pulse 78, temperature 98.4 F (36.9 C), temperature source Oral, resp. rate 18, height 5\' 6"  (1.676 m), weight 123 lb 6.4 oz (56 kg), SpO2 100 %.    HEENT: No thrush or ulcers. Lymphatics: Small submental node.  Tiny bilateral low neck nodes.  1/2 to 1 cm left axillary lymph node.  Left inguinal surgical incision has nearly healed.  2 tiny lymph nodes just inferior to the incision. Resp:  initial rhonchi at the upper posterior bilateral lung fields, cleared with coughing.  No respiratory distress. Cardio: Regular rate and rhythm.   GI: Abdomen soft and nontender.  No hepatosplenomegaly. Vascular: No leg edema.    Lab Results:  Lab Results  Component Value Date   WBC 14.8 (H) 12/06/2017   HGB 10.8 (L) 12/06/2017   HCT 33.3 (L) 12/06/2017   MCV 108.5 (H) 12/06/2017   PLT 125 (L) 12/06/2017   NEUTROABS 1.4 (L) 12/06/2017    Imaging:  No results found.  Medications: I have reviewed the patient's current medications.  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 Mild spinal megaly.  Ultrasound-guided biopsy of the left inguinal lymph node 09/18/2017, slightly "purulent "fluid aspirated, core biopsy is consistent with an atypical  lymphoid proliferation-extensive necrosis with surrounding epithelioid histiocytes, limited intact lymphoid tissue involved with CLL  Incisional biopsy of a necrotic/purulent left inguinal lymph node on 10/01/2017-extensive necrosis with granulomatous inflammation, small amount of viable lymphoid tissue involved with CLL, AFB and fungal stains negative 2.Hypothyroidism 3.Hepatitis B surface and core antibody positive 4.  Left lung pneumonia diagnosed 10/08/2017-completed 7 days of Levaquin    Disposition: [name] appears stable.  The cough has improved.  We are referring her for a follow-up chest x-ray today.  We reviewed the CBC from today.  Counts overall are stable.  She will return for lab and follow-up in 2 months.  She understands to contact the office in the interim with fever, chills, other signs of infection.  Patient seen with Dr. Benay Spice.    Ned Card ANP/GNP-BC   12/06/2017  9:32 AM This was a shared visit with Ned Card.  [name] was interviewed and examined.  Her performance status has improved since completing treatment for pneumonia.  She will be referred for a chest x-ray to be sure the pneumonia has completely cleared.  There is no indication for treating the CLL at present.  She has mild pancytopenia.  [name], MD

## 2017-12-06 NOTE — Telephone Encounter (Signed)
Scheduled appt per 5/16 los - sent reminder letter in the mail with appt date and time.

## 2017-12-07 ENCOUNTER — Telehealth: Payer: Self-pay | Admitting: Emergency Medicine

## 2017-12-07 NOTE — Telephone Encounter (Addendum)
Pt verbalized understanding.   ----- Message from Ladell Pier, MD sent at 12/07/2017  7:17 AM EDT ----- Please call patient cxr is now clear, f/u as scheduled

## 2017-12-25 ENCOUNTER — Telehealth: Payer: Self-pay

## 2017-12-25 DIAGNOSIS — R2689 Other abnormalities of gait and mobility: Secondary | ICD-10-CM | POA: Diagnosis not present

## 2017-12-25 DIAGNOSIS — C9100 Acute lymphoblastic leukemia not having achieved remission: Secondary | ICD-10-CM | POA: Diagnosis not present

## 2017-12-25 DIAGNOSIS — H748X1 Other specified disorders of right middle ear and mastoid: Secondary | ICD-10-CM | POA: Diagnosis not present

## 2017-12-25 DIAGNOSIS — J181 Lobar pneumonia, unspecified organism: Secondary | ICD-10-CM | POA: Diagnosis not present

## 2017-12-25 NOTE — Telephone Encounter (Signed)
Spoke with pt regarding appt. Pt stated she went to see family doctor and they "found pneumonia on chest xray". She states she also "has been having weird vision problems and they wanted to do a brain scan". Pt declined scan and requested to see Dr. Benay Spice. Per Dr. Benay Spice, pt can come Thursday at 1115. Pt agreeable.

## 2017-12-27 ENCOUNTER — Ambulatory Visit (HOSPITAL_COMMUNITY)
Admission: RE | Admit: 2017-12-27 | Discharge: 2017-12-27 | Disposition: A | Discharge status: Home / Self Care | Payer: BLUE CROSS/BLUE SHIELD | Source: Ambulatory Visit | Attending: Oncology | Admitting: Oncology

## 2017-12-27 ENCOUNTER — Telehealth: Payer: Self-pay

## 2017-12-27 ENCOUNTER — Inpatient Hospital Stay: Payer: BLUE CROSS/BLUE SHIELD | Attending: Oncology | Admitting: Oncology

## 2017-12-27 ENCOUNTER — Other Ambulatory Visit: Payer: Self-pay | Admitting: Emergency Medicine

## 2017-12-27 VITALS — BP 108/68 | HR 79 | Temp 97.8°F | Resp 18 | Ht 66.0 in | Wt 122.2 lb

## 2017-12-27 DIAGNOSIS — R918 Other nonspecific abnormal finding of lung field: Secondary | ICD-10-CM | POA: Insufficient documentation

## 2017-12-27 DIAGNOSIS — J069 Acute upper respiratory infection, unspecified: Secondary | ICD-10-CM | POA: Insufficient documentation

## 2017-12-27 DIAGNOSIS — R05 Cough: Secondary | ICD-10-CM | POA: Insufficient documentation

## 2017-12-27 DIAGNOSIS — J181 Lobar pneumonia, unspecified organism: Principal | ICD-10-CM

## 2017-12-27 DIAGNOSIS — Z8701 Personal history of pneumonia (recurrent): Secondary | ICD-10-CM | POA: Insufficient documentation

## 2017-12-27 DIAGNOSIS — E039 Hypothyroidism, unspecified: Secondary | ICD-10-CM | POA: Insufficient documentation

## 2017-12-27 DIAGNOSIS — B1910 Unspecified viral hepatitis B without hepatic coma: Secondary | ICD-10-CM | POA: Insufficient documentation

## 2017-12-27 DIAGNOSIS — C9110 Chronic lymphocytic leukemia of B-cell type not having achieved remission: Secondary | ICD-10-CM

## 2017-12-27 DIAGNOSIS — J189 Pneumonia, unspecified organism: Secondary | ICD-10-CM

## 2017-12-27 DIAGNOSIS — C9190 Lymphoid leukemia, unspecified not having achieved remission: Secondary | ICD-10-CM | POA: Diagnosis not present

## 2017-12-27 MED ORDER — AMOXICILLIN-POT CLAVULANATE 500-125 MG PO TABS: 1.0000 | ORAL_TABLET | Freq: Two times a day (BID) | ORAL | 0 refills | Status: DC

## 2017-12-27 NOTE — Telephone Encounter (Signed)
Per Dr. Benay Spice, xray reading does show some pneumonia. Pt to begin augmentin x7days and call for any fever, SOB, etc. Scheduling message sent to add office visit next week. LVM for pt to return call to clinic.

## 2017-12-27 NOTE — Progress Notes (Signed)
Per Dr.Sherrill, pt hgb and platelet count are lower than last check. Pt is to have labs redrawn on 6/10. Orders are in and scheduling message sent. Pt verbalized understanding of this .

## 2017-12-27 NOTE — Progress Notes (Signed)
[place] OFFICE PROGRESS NOTE   Diagnosis: CLL  INTERVAL HISTORY:   Ms. Gardiner presents prior to his scheduled visit.  She recently took a trip to [place].  Her husband had a "cold ".  She developed cold symptoms beginning on 12/17/2017.  She describes sinus and ear drainage and pressure.  She developed a cough beginning earlier this week.  She was seen at [facility].  She was diagnosed with "pneumonia ".  She did not start antibiotics. She had a fever on 3 occasions last week.  The fever was in the evening and was up to 101.6 degrees.  No fever at present. She has experienced 2 episodes of visual disturbance over the past few weeks.  These each lasted approximately 30 minutes and resolve spontaneously.  She describes vertical movement throughout the visual fields.  She has chronic balance difficulty.  No other neurologic symptoms. Good appetite.  No change in the palpable neck lymph nodes.  Objective:  Vital signs in last 24 hours:  Blood pressure 108/68, pulse 79, temperature 97.8 F (36.6 C), temperature source Oral, resp. rate 18, height 5\' 6"  (1.676 m), weight 122 lb 3.2 oz (55.4 kg), SpO2 100 %.    HEENT: Neck without mass Lymphatics: Less than 1 cm submental node, less than 1 cm mobile left axillary node.  No inguinal nodes. Resp: Scattered bilateral inspiratory/expiratory rhonchi and wheezes, no respiratory distress Cardio: Regular rate and rhythm GI: No hepatosplenomegaly, healed surgical incision at the left groin Vascular: No leg edema Neuro: Alert and oriented, extraocular movements appear intact, no nystagmus.  Finger-to-nose testing is normal.  She had difficulty with heel-to-toe walking    Lab Results:  Lab Results  Component Value Date   WBC 14.8 (H) 12/06/2017   HGB 10.8 (L) 12/06/2017   HCT 33.3 (L) 12/06/2017   MCV 108.5 (H) 12/06/2017   PLT 125 (L) 12/06/2017   NEUTROABS 1.4 (L) 12/06/2017    CMP  Lab Results  Component  Value Date   NA 141 09/11/2017   K 4.3 09/11/2017   CL 106 09/11/2017   CO2 26 09/11/2017   GLUCOSE 111 09/11/2017   BUN 15 09/11/2017   CREATININE 1.03 09/11/2017   CALCIUM 10.2 09/11/2017   PROT 6.7 09/11/2017   ALBUMIN 4.5 09/11/2017   AST 15 09/11/2017   ALT 10 09/11/2017   ALKPHOS 83 09/11/2017   BILITOT 0.5 09/11/2017   GFRNONAA 59 (L) 09/11/2017   GFRAA >60 09/11/2017    Medications: I have reviewed the patient's current medications.   Assessment/Plan:  1.CLL-diagnosed in August 2010  Enlarged leftinguinal lymph node January 2019,smallneck/axillary nodes and palpable splenomegaly 09/11/2017  CTson 09/17/2017-3 cm necrotic appearing lymph node in the left inguinal region, borderline enlarged pelvic/retroperitoneal, chest, and axillary nodes. Mild spinal megaly.  Ultrasound-guided biopsy of the left inguinal lymph node 09/18/2017, slightly "purulent "fluid aspirated, core biopsy is consistent with an atypical lymphoid proliferation-extensive necrosis with surrounding epithelioid histiocytes, limited intact lymphoid tissue involved with CLL  Incisional biopsy of a necrotic/purulent left inguinal lymph node on 10/01/2017-extensive necrosis with granulomatous inflammation, small amount of viable lymphoid tissue involved with CLL, AFB and fungal stains negative 2.Hypothyroidism 3.Hepatitis B surface and core antibody positive 4.Left lung pneumonia diagnosed 10/08/2017-completed 7 days of Levaquin    Disposition: 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  She was diagnosed with pneumonia in March 2019.  She again has symptoms of an upper respiratory infection.  I suspect she has a viral upper respiratory  infection.  We will obtain a chest x-ray to be sure she is not again developing pneumonia.  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  We will consider administering IVIG therapy if continues to have frequent infections or a  more severe infection.  We will follow-up on the chest x-ray today.  Ms. Sloan has episodes that are consistent with vertigo.  These may be associated with the current upper respiratory infection.  She also has balance and gait abnormality.  I will make a neurology referral.  She will return as scheduled on 02/05/2018.  I am available to see her sooner as needed.  25 minutes were spent with the patient today.  The majority of the time was used for counseling and coordination of care.  Betsy Coder, MD  12/27/2017  2:04 PM

## 2017-12-28 ENCOUNTER — Telehealth: Payer: Self-pay | Admitting: Emergency Medicine

## 2017-12-28 NOTE — Telephone Encounter (Signed)
Pt called back. appt w/ neurology has been scheduled for 6/10. Pt will get labs on 6/11 here and see lisa.

## 2017-12-28 NOTE — Telephone Encounter (Signed)
Spoke w/ pt. She is aware of rx being sent to pharmacy for augmentin. Pt voiced understanding of appt for labs on Monday and to see lisa on Tuesday @ 9:45.

## 2018-01-01 ENCOUNTER — Inpatient Hospital Stay: Payer: BLUE CROSS/BLUE SHIELD | Admitting: Nurse Practitioner

## 2018-01-01 ENCOUNTER — Inpatient Hospital Stay: Payer: BLUE CROSS/BLUE SHIELD

## 2018-01-01 ENCOUNTER — Encounter: Payer: Self-pay | Admitting: Nurse Practitioner

## 2018-01-01 VITALS — BP 119/76 | HR 100 | Temp 97.7°F | Resp 18 | Ht 66.0 in | Wt 119.7 lb

## 2018-01-01 DIAGNOSIS — Z8701 Personal history of pneumonia (recurrent): Secondary | ICD-10-CM

## 2018-01-01 DIAGNOSIS — E039 Hypothyroidism, unspecified: Secondary | ICD-10-CM | POA: Diagnosis not present

## 2018-01-01 DIAGNOSIS — C9110 Chronic lymphocytic leukemia of B-cell type not having achieved remission: Secondary | ICD-10-CM

## 2018-01-01 DIAGNOSIS — J069 Acute upper respiratory infection, unspecified: Secondary | ICD-10-CM | POA: Diagnosis not present

## 2018-01-01 DIAGNOSIS — B1910 Unspecified viral hepatitis B without hepatic coma: Secondary | ICD-10-CM | POA: Diagnosis not present

## 2018-01-01 DIAGNOSIS — C9190 Lymphoid leukemia, unspecified not having achieved remission: Secondary | ICD-10-CM

## 2018-01-01 LAB — CMP (CANCER CENTER ONLY)
ALT: 12 U/L (ref 0–55)
AST: 15 U/L (ref 5–34)
Albumin: 4.5 g/dL (ref 3.5–5.0)
Alkaline Phosphatase: 75 U/L (ref 40–150)
Anion gap: 8 (ref 3–11)
BUN: 16 mg/dL (ref 7–26)
CO2: 26 mmol/L (ref 22–29)
Calcium: 10.4 mg/dL (ref 8.4–10.4)
Chloride: 107 mmol/L (ref 98–109)
Creatinine: 0.93 mg/dL (ref 0.60–1.10)
GFR, Est AFR Am: >60 mL/min (ref >60)
GFR, Estimated: >60 mL/min (ref >60)
Glucose, Bld: 100 mg/dL (ref 70–140)
Potassium: 4.4 mmol/L (ref 3.5–5.1)
Sodium: 141 mmol/L (ref 136–145)
Total Bilirubin: 0.4 mg/dL (ref 0.2–1.2)
Total Protein: 6.7 g/dL (ref 6.4–8.3)

## 2018-01-01 LAB — CBC WITH DIFFERENTIAL (CANCER CENTER ONLY)
Basophils Absolute: 0 10*3/uL (ref 0.0–0.1)
Basophils Relative: 0 %
Eosinophils Absolute: 0.1 10*3/uL (ref 0.0–0.5)
Eosinophils Relative: 0 %
HCT: 31.8 % — ABNORMAL LOW (ref 34.8–46.6)
Hemoglobin: 10.2 g/dL — ABNORMAL LOW (ref 11.6–15.9)
Lymphocytes Relative: 89 %
Lymphs Abs: 13.3 10*3/uL — ABNORMAL HIGH (ref 0.9–3.3)
MCH: 35.1 pg — ABNORMAL HIGH (ref 25.1–34.0)
MCHC: 32.1 g/dL (ref 31.5–36.0)
MCV: 109.3 fL — ABNORMAL HIGH (ref 79.5–101.0)
Monocytes Absolute: 0.1 10*3/uL (ref 0.1–0.9)
Monocytes Relative: 1 %
Neutro Abs: 1.4 10*3/uL — ABNORMAL LOW (ref 1.5–6.5)
Neutrophils Relative %: 10 %
Platelet Count: 137 10*3/uL — ABNORMAL LOW (ref 145–400)
RBC: 2.91 MIL/uL — ABNORMAL LOW (ref 3.70–5.45)
RDW: 13.6 % (ref 11.2–14.5)
WBC Count: 15 10*3/uL — ABNORMAL HIGH (ref 3.9–10.3)

## 2018-01-01 LAB — DIRECT ANTIGLOBULIN TEST (NOT AT ARMC)
DAT, IgG: NEGATIVE
DAT, complement: NEGATIVE

## 2018-01-01 LAB — RETICULOCYTES
RBC.: 2.91 MIL/uL — ABNORMAL LOW (ref 3.70–5.45)
Retic Count, Absolute: 55.3 10*3/uL (ref 33.7–90.7)
Retic Ct Pct: 1.9 % (ref 0.7–2.1)

## 2018-01-01 LAB — LACTATE DEHYDROGENASE: LDH: 161 U/L (ref 125–245)

## 2018-01-01 LAB — VITAMIN B12: Vitamin B-12: 534 pg/mL (ref 180–914)

## 2018-01-01 MED ORDER — BENZONATATE 100 MG PO CAPS: 100.0000 mg | ORAL_CAPSULE | Freq: Three times a day (TID) | ORAL | 0 refills | Status: DC | PRN

## 2018-01-01 NOTE — Progress Notes (Addendum)
[place] OFFICE PROGRESS NOTE   Diagnosis: CLL  INTERVAL HISTORY:   Meagan Walsh returns as scheduled.  Cough is better.  She denies fever.  No sweats.  No shaking chills.  She had a single episode of a "floater" involving the right eye lasting for 1 second.  No diplopia.  No recent falls.  Objective:  Vital signs in last 24 hours:  Blood pressure 119/76, pulse 100, temperature 97.7 F (36.5 C), temperature source Oral, resp. rate 18, height 5\' 6"  (1.676 m), weight 119 lb 11.2 oz (54.3 kg), SpO2 98 %.    HEENT: No thrush or ulcers. Lymphatics: Small submental node; multiple small anterior and posterior cervical nodes.  1/2 to 1 cm mobile left axillary node.  Question tiny left inguinal node inferior to the inguinal scar. Resp: Bilateral inspiratory/expiratory rhonchi and scattered wheezes.  No respiratory distress. Cardio: Regular rate and rhythm. GI: Abdomen soft and nontender.  No hepatosplenomegaly. Vascular: No leg edema. Neuro: Alert and oriented.  Extraocular movements intact.  No nystagmus.    Lab Results:  Lab Results  Component Value Date   WBC 15.0 (H) 01/01/2018   HGB 10.2 (L) 01/01/2018   HCT 31.8 (L) 01/01/2018   MCV 109.3 (H) 01/01/2018   PLT 137 (L) 01/01/2018   NEUTROABS 1.4 (L) 01/01/2018    Imaging:  No results found.  Medications: I have reviewed the patient's current medications.  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 Mild spinal megaly.  Ultrasound-guided biopsy of the left inguinal lymph node 09/18/2017, slightly "purulent "fluid aspirated, core biopsy is consistent with an atypical lymphoid proliferation-extensive necrosis with surrounding epithelioid  histiocytes, limited intact lymphoid tissue involved with CLL  Incisional biopsy of a necrotic/purulent left inguinal lymph node on 10/01/2017-extensive necrosis with granulomatous inflammation, small amount of viable lymphoid tissue involved with CLL, AFB and fungal stains negative 2.Hypothyroidism 3.Hepatitis B surface and core antibody positive 4.Left lung pneumonia diagnosed 10/08/2017-completed 7 days of Levaquin 5.  Left lung pneumonia on chest x-ray 12/27/2017.  Augmentin prescribed.   Disposition: Meagan Walsh appears stable.  She will continue the course of Augmentin to completion.  We sent a prescription to her pharmacy for Tessalon Perles 100 mg 3 times daily as needed for the cough.  She understands to contact the office with shortness of breath, fever, worsening cough.  We reviewed the lab results that were available during today's visit.  CBC overall is stable.  No increase in the reticulocyte count.  LDH is normal.  DAT negative.  Chemistry panel is unremarkable.  We will follow-up on the B12 level.  She has an appointment with neurology tomorrow to evaluate the recent visual disturbance, balance issues.  She will return for a repeat chest x-ray, lab and follow-up on 02/05/2018.  She will contact the office in the interim as outlined above or with any other problems.  Patient seen with Dr. Benay Spice.  25 minutes were spent face-to-face at today's visit with the majority of that time involved in counseling/coordination of care.    Ned Card ANP/GNP-BC   01/01/2018  10:13 AM  This was a shared visit with Ned Card.  [name] was interviewed and examined.  She appears to be recovering from an upper respiratory infection, I suspect she has bronchitis as opposed to pneumonia, but the most  recent chest x-ray suggested a left lower lobe infiltrate.  She will complete the course of Augmentin.  She knows to contact us for recurrent fever or dyspnea.  We will obtain a repeat chest x-ray  when she returns next month.  She will contact us if her symptoms have not improved over the next few weeks.  She has mild anemia, likely secondary to CLL involving the bone marrow and recent infections.  She does not appear to have hemolysis.  She will undergo a neurology evaluation later this week.  [name], MD

## 2018-01-02 ENCOUNTER — Encounter: Payer: Self-pay | Admitting: Neurology

## 2018-01-02 ENCOUNTER — Other Ambulatory Visit: Payer: Self-pay

## 2018-01-02 ENCOUNTER — Ambulatory Visit: Payer: BLUE CROSS/BLUE SHIELD | Admitting: Neurology

## 2018-01-02 VITALS — BP 90/58 | HR 78 | Ht 66.0 in | Wt 119.0 lb

## 2018-01-02 DIAGNOSIS — R270 Ataxia, unspecified: Secondary | ICD-10-CM

## 2018-01-02 DIAGNOSIS — R42 Dizziness and giddiness: Secondary | ICD-10-CM

## 2018-01-02 NOTE — Patient Instructions (Addendum)
1. Schedule MRI brain with and without contrast  We have sent a referral for your OPEN MRI to [facility].  They will contact you directly to schedule your appointment.  [facility] is located at [address], [place], [place] [address].  If you need to speak with [facility] for any reason, they can be reached at [phone]  2. Have Lamictal and Lithium levels checked 3. Contact our office for any change in symptoms 4. Our office will contact you with MRI results, if normal, follow-up on as needed basis

## 2018-01-02 NOTE — Progress Notes (Signed)
NEUROLOGY CONSULTATION NOTE  Meagan Walsh MRN: [id number] DOB: 08/23/1960  Referring provider: Dr, Betsy Coder Primary care provider: Dr. Domenick Gong  Reason for consult:  Ataxia, visual disturbance  Dear Dr Benay Spice:  Thank you for your kind referral of Meagan Walsh for consultation of the above symptoms. Although her history is well known to you, please allow me to reiterate it for the purpose of our medical record. Records and images were personally reviewed where available.  HISTORY OF PRESENT ILLNESS: This is a very pleasant 57 year old right-handed woman with a history of CLL, hypothyroidism, bipolar disorder, depression, presenting for evaluation of episodes of vertigo last month. The first episode occurred while they were on a trip last 12/14/17, she got off the elevator to have breakfast and then "everything was levitating up and down." She sat down and felt better after 30 minutes, no associated nausea/vomiting, focal numbness/tingling/weakness, headache, speech or vision changes. She recalls having upper respiratory symptoms since March, diagnosed with pneumonia, and now with bronchitis. She recalls having a cold with congestion affecting her ears. The second episode occurred on 12/21/17 while she was driving to work. She had the same sensation of movement where things in front of her were going up and down. She drove very slowly and was stumbling when she got out of the car. Co-workers asked if she was okay. She went into the building and sat down, closed her eyes for 30 minutes, then felt better. Again no other associated symptoms. She denies any hearing loss or tinnitus, but hears crackling in her ears when she swallows. Her ears have been clogged for a while. She denies any headaches, diplopia, dysarthria/dysphagia, focal numbness/tingling/weakneess, bowel/bladder dysfunction, no falls. She reports she has been "clumsy" for a long time and would often have bruises from  moving too fast and bumping into things. No family history of similar symptoms. She has not had bloodwork done of Lithium and Lamictal levels done recently. She does not take her Xanax regularly, usually once a week.    PAST MEDICAL HISTORY: Past Medical History:  Diagnosis Date  . Anxiety   . Cancer ([place])    CLL  . Degenerative disk disease    Neck--chronic pain  . Depression   . Fibroids    Uterine  . 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 Hypothyroidism   . Lymphocytosis   . Mitral valve prolapse   . Mitral valve prolapse   . Thyroid disease    Hypothyroid    PAST SURGICAL HISTORY: Past Surgical History:  Procedure Laterality Date  . BREAST BIOPSY Left 2000  . CESAREAN SECTION  1996  . MELANOMA EXCISION WITH SENTINEL LYMPH NODE BIOPSY Left 10/01/2017   Procedure: EXCISIONAL BIOPSY OF LEFT INGUINAL LYMPH NODE;  Surgeon: Greer Pickerel, MD;  Location: [facility];  Service: General;  Laterality: Left;  . REVISION OF ABDOMINAL SCAR    . TONSILLECTOMY     age 60    MEDICATIONS: Current Outpatient Medications on File Prior to Visit  Medication Sig Dispense Refill  . ALPRAZolam (XANAX) 0.25 MG tablet Take 0.25-0.5 mg by mouth daily as needed. For anxiety  1  . amoxicillin-clavulanate (AUGMENTIN) 500-125 MG tablet Take 1 tablet (500 mg total) by mouth 2 (two) times daily. 14 tablet 0  . benzonatate (TESSALON) 100 MG capsule Take 1 capsule (100 mg total) by mouth 3 (three) times daily as needed for cough. 30 capsule 0  .  lamoTRIgine (LAMICTAL) 200 MG tablet Take 200 mg by mouth 2 (two) times daily.  0  . levonorgestrel (MIRENA) 20 MCG/24HR IUD 1 each by Intrauterine route once.     [name] levothyroxine (SYNTHROID, LEVOTHROID) 100 MCG tablet Take 100 mcg by mouth daily.    [name] lithium carbonate (ESKALITH) 450 MG CR tablet Take 900 mg by mouth at bedtime.  1  . naproxen sodium (ALEVE) 220 MG tablet Take 220 mg by mouth 2 (two) times daily as needed (for pain.).     [name] vortioxetine HBr (TRINTELLIX) 20 MG TABS Take 20 mg by mouth daily.     [name] zolpidem (AMBIEN) 10 MG tablet Take 5 mg by mouth at bedtime.      No current facility-administered medications on file prior to visit.     ALLERGIES: No Known Allergies  FAMILY HISTORY: Family History  Problem Relation Age of Onset  . Breast cancer Mother     SOCIAL HISTORY: Social History   Socioeconomic History  . Marital status: Married    Spouse name: Not on file  . Number of children: Not on file  . Years of education: Not on file  . Highest education level: Not on file  Occupational History  . Not on file  Social Needs  . Financial resource strain: Not on file  . Food insecurity:    Worry: Not on file    Inability: Not on file  . Transportation needs:    Medical: Not on file    Non-medical: Not on file  Tobacco Use  . Smoking status: Never Smoker  . Smokeless tobacco: Never Used  Substance and Sexual Activity  . Alcohol use: Yes    Frequency: Never    Comment: 2 glasses of wine 2 times per week   . Drug use: No  . Sexual activity: Not on file  Lifestyle  . Physical activity:    Days per week: Not on file    Minutes per session: Not on file  . Stress: Not on file  Relationships  . Social connections:    Talks on phone: Not on file    Gets together: Not on file    Attends religious service: Not on file    Active member of club or organization: Not on file    Attends meetings of clubs or organizations: Not on file    Relationship status: Not on file  . Intimate partner violence:    Fear of current or ex partner: Not on file    Emotionally abused: Not on file    Physically abused: Not on file    Forced sexual activity: Not on file  Other Topics Concern  . Not on file  Social History Narrative   Pt lives in 2 story home with her husband   Has 3 adult children   Masters degree   Works at [employer]: Constitutional: No fevers, chills, or sweats,  no generalized fatigue, change in appetite Eyes: No visual changes, double vision, eye pain Ear, nose and throat: No hearing loss, ear pain, nasal congestion, sore throat Cardiovascular: No chest pain, palpitations Respiratory:  No shortness of breath at rest or with exertion, wheezes GastrointestinaI: No nausea, vomiting, diarrhea, abdominal pain, fecal incontinence Genitourinary:  No dysuria, urinary retention or frequency Musculoskeletal:  No neck pain, back pain Integumentary: No rash, pruritus, skin lesions Neurological: as above Psychiatric: No depression, insomnia, anxiety Endocrine: No palpitations, fatigue, diaphoresis, mood swings, change in appetite, change  in weight, increased thirst Hematologic/Lymphatic:  No anemia, purpura, petechiae. Allergic/Immunologic: no itchy/runny eyes, nasal congestion, recent allergic reactions, rashes  PHYSICAL EXAM: Vitals:   01/02/18 1307  BP: (!) 90/58  Pulse: 78  SpO2: 96%   General: No acute distress Head:  Normocephalic/atraumatic Eyes: Fundoscopic exam shows bilateral sharp discs, no vessel changes, exudates, or hemorrhages Neck: supple, no paraspinal tenderness, full range of motion Back: No paraspinal tenderness Heart: regular rate and rhythm Lungs: Clear to auscultation bilaterally. Vascular: No carotid bruits. Skin/Extremities: No rash, no edema Neurological Exam: Mental status: alert and oriented to person, place, and time, no dysarthria or aphasia, Fund of knowledge is appropriate.  Recent and remote memory are intact.  Attention and concentration are normal.    Able to name objects and repeat phrases. Cranial nerves: CN I: not tested CN II: pupils equal, round and reactive to light, visual fields intact, fundi unremarkable. CN III, IV, VI:  full range of motion, no nystagmus, no ptosis CN V: facial sensation intact CN VII: upper and lower face symmetric CN VIII: hearing intact to finger rub CN IX, X: gag intact, uvula  midline CN XI: sternocleidomastoid and trapezius muscles intact CN XII: tongue midline Bulk & Tone: normal, no fasciculations. Motor: 5/5 throughout with no pronator drift. Sensation: intact to light touch, cold, pin, vibration and joint position sense.  No extinction to double simultaneous stimulation.  Romberg test negative Deep Tendon Reflexes: +2 throughout except for absent right ankle jerk, no ankle clonus Plantar responses: downgoing bilaterally Cerebellar: no incoordination on finger to nose, heel to shin. No dysdiadochokinesia Gait: narrow-based and steady, able to tandem walk adequately. Tremor: none  IMPRESSION: This is a very pleasant 57 year old right-handed woman with a history of CLL, hypothyroidism, bipolar disorder, depression, presenting for evaluation of 2 episodes of vertigo last month where she describes a sensation of movement lasting 30 minutes, most recently episode she was stumbling around. Her neurological exam today is normal. Etiology of symptoms is unclear, she has been dealing with upper respiratory symptoms for the past 3 months, which could contribute to possible vestibular dysfunction. TIA is a possibility, but less likely. MRI brain with and without contrast will be ordered to assess for underlying structural abnormality. She will speak to her prescriber about checking Lamictal and Lithium levels. She knows to contact our office for any change in symptoms. If MRI normal, follow-up on as needed basis.   Thank you for allowing me to participate in the care of this patient. Please do not hesitate to call for any questions or concerns.   Ellouise Newer, M.D.  CC: Dr. Osborne Casco, Dr. Benay Spice

## 2018-01-31 DIAGNOSIS — F332 Major depressive disorder, recurrent severe without psychotic features: Secondary | ICD-10-CM | POA: Diagnosis not present

## 2018-01-31 DIAGNOSIS — Z79899 Other long term (current) drug therapy: Secondary | ICD-10-CM | POA: Diagnosis not present

## 2018-02-05 ENCOUNTER — Telehealth: Payer: Self-pay

## 2018-02-05 ENCOUNTER — Inpatient Hospital Stay: Payer: BLUE CROSS/BLUE SHIELD | Attending: Oncology | Admitting: Oncology

## 2018-02-05 ENCOUNTER — Inpatient Hospital Stay: Payer: BLUE CROSS/BLUE SHIELD

## 2018-02-05 VITALS — BP 105/76 | HR 90 | Temp 97.9°F | Resp 18 | Ht 66.0 in | Wt 122.6 lb

## 2018-02-05 DIAGNOSIS — E039 Hypothyroidism, unspecified: Secondary | ICD-10-CM | POA: Insufficient documentation

## 2018-02-05 DIAGNOSIS — D801 Nonfamilial hypogammaglobulinemia: Secondary | ICD-10-CM | POA: Insufficient documentation

## 2018-02-05 DIAGNOSIS — C9110 Chronic lymphocytic leukemia of B-cell type not having achieved remission: Secondary | ICD-10-CM

## 2018-02-05 DIAGNOSIS — D630 Anemia in neoplastic disease: Secondary | ICD-10-CM | POA: Diagnosis not present

## 2018-02-05 DIAGNOSIS — B181 Chronic viral hepatitis B without delta-agent: Secondary | ICD-10-CM | POA: Diagnosis not present

## 2018-02-05 DIAGNOSIS — C9190 Lymphoid leukemia, unspecified not having achieved remission: Secondary | ICD-10-CM | POA: Insufficient documentation

## 2018-02-05 LAB — CBC WITH DIFFERENTIAL (CANCER CENTER ONLY)
Basophils Absolute: 0 10*3/uL (ref 0.0–0.1)
Basophils Relative: 0 %
Eosinophils Absolute: 0 10*3/uL (ref 0.0–0.5)
Eosinophils Relative: 0 %
HCT: 26.5 % — ABNORMAL LOW (ref 34.8–46.6)
Hemoglobin: 8.8 g/dL — ABNORMAL LOW (ref 11.6–15.9)
Lymphocytes Relative: 91 %
Lymphs Abs: 17.4 10*3/uL — ABNORMAL HIGH (ref 0.9–3.3)
MCH: 36.8 pg — ABNORMAL HIGH (ref 25.1–34.0)
MCHC: 33.4 g/dL (ref 31.5–36.0)
MCV: 110.1 fL — ABNORMAL HIGH (ref 79.5–101.0)
Monocytes Absolute: 0.2 10*3/uL (ref 0.1–0.9)
Monocytes Relative: 1 %
Neutro Abs: 1.5 10*3/uL (ref 1.5–6.5)
Neutrophils Relative %: 8 %
Platelet Count: 119 10*3/uL — ABNORMAL LOW (ref 145–400)
RBC: 2.4 MIL/uL — ABNORMAL LOW (ref 3.70–5.45)
RDW: 15.6 % — ABNORMAL HIGH (ref 11.2–14.5)
WBC Count: 19.2 10*3/uL — ABNORMAL HIGH (ref 3.9–10.3)

## 2018-02-05 NOTE — Progress Notes (Signed)
[place] OFFICE PROGRESS NOTE   Diagnosis: CLL  INTERVAL HISTORY:   Meagan Walsh returns for a scheduled visit.  She recently returned from a trip to [place].  She reports the respiratory symptoms have resolved.  No further cough or fever.  Good appetite and energy level.  No night sweats.  The small palpable lymph nodes are unchanged. She was evaluated by neurology for the visual and balance symptoms.  She reports no recurrence of the visual disturbance and her balance is now normal.  She feels the symptoms may be have been related to the upper respiratory infection.  Objective:  Vital signs in last 24 hours:  Blood pressure 105/76, pulse 90, temperature 97.9 F (36.6 C), temperature source Oral, resp. rate 18, height '5\' 6"'  (1.676 m), weight 122 lb 9.6 oz (55.6 kg), SpO2 100 %.    HEENT: Neck without mass Lymphatics: Less than 1 cm cervical and submental nodes.  1 cm bilateral axillary nodes, pea-sized inguinal nodes Resp: Lungs clear bilaterally, no respiratory distress Cardio: Regular rate and rhythm GI: No hepatosplenomegaly Vascular: No leg edema   Lab Results:  Lab Results  Component Value Date   WBC 19.2 (H) 02/05/2018   HGB 8.8 (L) 02/05/2018   HCT 26.5 (L) 02/05/2018   MCV 110.1 (H) 02/05/2018   PLT 119 (L) 02/05/2018   NEUTROABS 1.5 02/05/2018    CMP  Lab Results  Component Value Date   NA 141 01/01/2018   K 4.4 01/01/2018   CL 107 01/01/2018   CO2 26 01/01/2018   GLUCOSE 100 01/01/2018   BUN 16 01/01/2018   CREATININE 0.93 01/01/2018   CALCIUM 10.4 01/01/2018   PROT 6.7 01/01/2018   ALBUMIN 4.5 01/01/2018   AST 15 01/01/2018   ALT 12 01/01/2018   ALKPHOS 75 01/01/2018   BILITOT 0.4 01/01/2018   GFRNONAA >60 01/01/2018   GFRAA >60 01/01/2018     Medications: I have reviewed the patient's current medications.   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 Mild spinal megaly.  Ultrasound-guided biopsy of the left inguinal lymph node 09/18/2017, slightly "purulent "fluid aspirated, core biopsy is consistent with an atypical lymphoid proliferation-extensive necrosis with surrounding epithelioid histiocytes, limited intact lymphoid tissue involved with CLL  Incisional biopsy of a necrotic/purulent left inguinal lymph node on 10/01/2017-extensive necrosis with granulomatous inflammation, small amount of viable lymphoid tissue involved with CLL, AFB and fungal stains negative 2.Hypothyroidism 3.Hepatitis B surface and core antibody positive 4.Left lung pneumonia diagnosed 10/08/2017-completed 7 days of Levaquin 5.  Left lung pneumonia on chest x-ray 12/27/2017.  Augmentin prescribed. 6.  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  The anemia has progressed.  She does not appear to have hemolysis.  The anemia is most likely secondary to CLL involving the bone marrow.  I explained the indication for treating the CLL secondary to the severe anemia.  We discussed treatment options including chemoimmunotherapy and ibrutinib.  She does not wish to begin treatment at present.  She agrees to return for an office visit and repeat CBC in approximately 2 weeks.  Ms. Nass has recovered from the upper respiratory infection.  She does not wish to repeat the  chest x-ray.  She has no recurrent neurologic symptoms and would like to forego the brain MRI ordered by neurology.  She will return in 2 weeks.  We will decide on a diagnostic bone marrow biopsy and treatment then.  She will contact us in the interim for new  symptoms.  25 minutes were spent with the patient today.  The majority of the time was used for counseling and coordination of care.  Betsy Coder, MD  02/05/2018  9:53 AM

## 2018-02-05 NOTE — Telephone Encounter (Signed)
Printed avs and calender of upcoming appointment,. Per 7/16 los

## 2018-02-06 DIAGNOSIS — F332 Major depressive disorder, recurrent severe without psychotic features: Secondary | ICD-10-CM | POA: Diagnosis not present

## 2018-02-20 ENCOUNTER — Inpatient Hospital Stay: Payer: BLUE CROSS/BLUE SHIELD | Admitting: Nurse Practitioner

## 2018-02-20 ENCOUNTER — Inpatient Hospital Stay: Payer: BLUE CROSS/BLUE SHIELD

## 2018-02-20 ENCOUNTER — Telehealth: Payer: Self-pay | Admitting: Oncology

## 2018-02-20 VITALS — BP 100/51 | HR 82 | Temp 97.9°F | Resp 18 | Ht 66.0 in | Wt 122.1 lb

## 2018-02-20 DIAGNOSIS — D801 Nonfamilial hypogammaglobulinemia: Secondary | ICD-10-CM | POA: Diagnosis not present

## 2018-02-20 DIAGNOSIS — D630 Anemia in neoplastic disease: Secondary | ICD-10-CM | POA: Diagnosis not present

## 2018-02-20 DIAGNOSIS — B181 Chronic viral hepatitis B without delta-agent: Secondary | ICD-10-CM | POA: Diagnosis not present

## 2018-02-20 DIAGNOSIS — C9110 Chronic lymphocytic leukemia of B-cell type not having achieved remission: Secondary | ICD-10-CM

## 2018-02-20 DIAGNOSIS — C9190 Lymphoid leukemia, unspecified not having achieved remission: Secondary | ICD-10-CM

## 2018-02-20 DIAGNOSIS — E039 Hypothyroidism, unspecified: Secondary | ICD-10-CM | POA: Diagnosis not present

## 2018-02-20 LAB — CMP (CANCER CENTER ONLY)
ALT: 18 U/L (ref 0–44)
AST: 19 U/L (ref 15–41)
Albumin: 4.7 g/dL (ref 3.5–5.0)
Alkaline Phosphatase: 66 U/L (ref 38–126)
Anion gap: 7 (ref 5–15)
BUN: 20 mg/dL (ref 6–20)
CO2: 29 mmol/L (ref 22–32)
Calcium: 10.2 mg/dL (ref 8.9–10.3)
Chloride: 104 mmol/L (ref 98–111)
Creatinine: 0.96 mg/dL (ref 0.44–1.00)
GFR, Est AFR Am: >60 mL/min (ref >60)
GFR, Estimated: >60 mL/min (ref >60)
Glucose, Bld: 97 mg/dL (ref 70–99)
Potassium: 4.2 mmol/L (ref 3.5–5.1)
Sodium: 140 mmol/L (ref 135–145)
Total Bilirubin: 0.6 mg/dL (ref 0.3–1.2)
Total Protein: 6.5 g/dL (ref 6.5–8.1)

## 2018-02-20 LAB — CBC WITH DIFFERENTIAL (CANCER CENTER ONLY)
Basophils Absolute: 0 10*3/uL (ref 0.0–0.1)
Basophils Relative: 0 %
Eosinophils Absolute: 0.1 10*3/uL (ref 0.0–0.5)
Eosinophils Relative: 0 %
HCT: 27 % — ABNORMAL LOW (ref 34.8–46.6)
Hemoglobin: 8.8 g/dL — ABNORMAL LOW (ref 11.6–15.9)
Lymphocytes Relative: 92 %
Lymphs Abs: 18.1 10*3/uL — ABNORMAL HIGH (ref 0.9–3.3)
MCH: 36.5 pg — ABNORMAL HIGH (ref 25.1–34.0)
MCHC: 32.6 g/dL (ref 31.5–36.0)
MCV: 112 fL — ABNORMAL HIGH (ref 79.5–101.0)
Monocytes Absolute: 0.2 10*3/uL (ref 0.1–0.9)
Monocytes Relative: 1 %
Neutro Abs: 1.3 10*3/uL — ABNORMAL LOW (ref 1.5–6.5)
Neutrophils Relative %: 7 %
Platelet Count: 89 10*3/uL — ABNORMAL LOW (ref 145–400)
RBC: 2.41 MIL/uL — ABNORMAL LOW (ref 3.70–5.45)
RDW: 15.2 % — ABNORMAL HIGH (ref 11.2–14.5)
WBC Count: 19.6 10*3/uL — ABNORMAL HIGH (ref 3.9–10.3)

## 2018-02-20 LAB — LACTATE DEHYDROGENASE: LDH: 163 U/L (ref 98–192)

## 2018-02-20 LAB — RETICULOCYTES
RBC.: 2.41 MIL/uL — ABNORMAL LOW (ref 3.70–5.45)
Retic Count, Absolute: 41 10*3/uL (ref 33.7–90.7)
Retic Ct Pct: 1.7 % (ref 0.7–2.1)

## 2018-02-20 LAB — FISH,CLL PROGNOSTIC PANEL

## 2018-02-20 NOTE — Progress Notes (Addendum)
  [facility] OFFICE PROGRESS NOTE   Diagnosis:  CLL  INTERVAL HISTORY:   Ms. Dike returns as scheduled.  She feels well.  No fevers or sweats.  No shortness of breath.  No cough.  She has a good appetite.  Her weight is stable.  She denies pain.  No bruising or bleeding.  No bowel or bladder problems.  Objective:  Vital signs in last 24 hours:  Blood pressure (!) 100/51, pulse 82, temperature 97.9 F (36.6 C), temperature source Oral, resp. rate 18, height _0  (1.676 m), weight 122 lb 1.6 oz (55.4 kg), SpO2 100 %.    HEENT: No thrush or ulcers. Lymphatics: Tiny bilateral cervical and inguinal lymph nodes.  Approximate 1 cm left axillary lymph node. Resp: Lungs clear bilaterally. Cardio: Regular rate and rhythm. GI: Abdomen soft and nontender.  No hepatomegaly.  Spleen tip palpable. Vascular: No leg edema.  Skin: No rash.   Lab Results:  Lab Results  Component Value Date   WBC 19.6 (H) 02/20/2018   HGB 8.8 (L) 02/20/2018   HCT 27.0 (L) 02/20/2018   MCV 112.0 (H) 02/20/2018   PLT 89 (L) 02/20/2018   NEUTROABS 1.3 (L) 02/20/2018    Imaging:  No results found.  Medications: I have reviewed the patient's current medications.  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 Mild spinal megaly.  Ultrasound-guided biopsy of the left inguinal lymph node 09/18/2017, slightly "purulent "fluid aspirated, core biopsy is consistent with an atypical lymphoid proliferation-extensive necrosis with surrounding epithelioid histiocytes, limited intact lymphoid tissue involved with CLL  Incisional biopsy of a necrotic/purulent left inguinal lymph node on  10/01/2017-extensive necrosis with granulomatous inflammation, small amount of viable lymphoid tissue involved with CLL, AFB and fungal stains negative 2.Hypothyroidism 3.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 Augmentin prescribed. 6.  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  Hypogammaglobulinemia    Disposition: Ms. Radebaugh appears unchanged.  She has persistent anemia.  She understands this is most likely secondary to CLL involving the bone marrow.  She appears asymptomatic from the anemia.  Dr. Ammie Dalton recommends a bone marrow biopsy.  She agrees with this plan.  We will refer to have the bone marrow biopsy done early next week and see her in follow-up on 03/01/2018 to review the results and discuss treatment options.  She will contact the office in the interim with any problems.  Patient seen with Dr. Benay Spice.  25 minutes were spent face-to-face at today's visit with the majority of that time involved in counseling/coordination of care.    Ned Card ANP/GNP-BC   02/20/2018  9:12 AM  This was a shared visit with Ned Card.  Ms. Eutsler appears stable.  A CLL molecular panel does not reveal high risk changes.  She will undergo a diagnostic bmbx prior to deciding on systemic therapy.  [name], MD

## 2018-02-20 NOTE — Telephone Encounter (Signed)
Lab appointment added/ IB message to Ginette Otto to add follow up appt due to needing override per 7/31 los

## 2018-02-21 ENCOUNTER — Other Ambulatory Visit: Payer: Self-pay | Admitting: Nurse Practitioner

## 2018-02-21 DIAGNOSIS — C9110 Chronic lymphocytic leukemia of B-cell type not having achieved remission: Secondary | ICD-10-CM

## 2018-02-25 ENCOUNTER — Other Ambulatory Visit: Payer: Self-pay | Admitting: Radiology

## 2018-02-26 ENCOUNTER — Ambulatory Visit (HOSPITAL_COMMUNITY)
Admission: RE | Admit: 2018-02-26 | Discharge: 2018-02-26 | Disposition: A | Discharge status: Home / Self Care | Payer: BLUE CROSS/BLUE SHIELD | Source: Ambulatory Visit | Attending: Nurse Practitioner | Admitting: Nurse Practitioner

## 2018-02-26 ENCOUNTER — Encounter (HOSPITAL_COMMUNITY): Payer: Self-pay

## 2018-02-26 DIAGNOSIS — I341 Nonrheumatic mitral (valve) prolapse: Secondary | ICD-10-CM | POA: Insufficient documentation

## 2018-02-26 DIAGNOSIS — E039 Hypothyroidism, unspecified: Secondary | ICD-10-CM | POA: Insufficient documentation

## 2018-02-26 DIAGNOSIS — Z79890 Hormone replacement therapy (postmenopausal): Secondary | ICD-10-CM | POA: Diagnosis not present

## 2018-02-26 DIAGNOSIS — D696 Thrombocytopenia, unspecified: Secondary | ICD-10-CM | POA: Insufficient documentation

## 2018-02-26 DIAGNOSIS — F329 Major depressive disorder, single episode, unspecified: Secondary | ICD-10-CM | POA: Insufficient documentation

## 2018-02-26 DIAGNOSIS — Z975 Presence of (intrauterine) contraceptive device: Secondary | ICD-10-CM | POA: Insufficient documentation

## 2018-02-26 DIAGNOSIS — Z803 Family history of malignant neoplasm of breast: Secondary | ICD-10-CM | POA: Insufficient documentation

## 2018-02-26 DIAGNOSIS — C9110 Chronic lymphocytic leukemia of B-cell type not having achieved remission: Secondary | ICD-10-CM | POA: Diagnosis not present

## 2018-02-26 DIAGNOSIS — F419 Anxiety disorder, unspecified: Secondary | ICD-10-CM | POA: Diagnosis not present

## 2018-02-26 DIAGNOSIS — Z9889 Other specified postprocedural states: Secondary | ICD-10-CM | POA: Diagnosis not present

## 2018-02-26 DIAGNOSIS — D539 Nutritional anemia, unspecified: Secondary | ICD-10-CM | POA: Insufficient documentation

## 2018-02-26 DIAGNOSIS — Z79899 Other long term (current) drug therapy: Secondary | ICD-10-CM | POA: Diagnosis not present

## 2018-02-26 LAB — CBC WITH DIFFERENTIAL/PLATELET
Basophils Absolute: 0 10*3/uL (ref 0.0–0.1)
Basophils Relative: 0 %
Eosinophils Absolute: 0.1 10*3/uL (ref 0.0–0.7)
Eosinophils Relative: 0 %
HCT: 26.7 % — ABNORMAL LOW (ref 36.0–46.0)
Hemoglobin: 8.9 g/dL — ABNORMAL LOW (ref 12.0–15.0)
Lymphocytes Relative: 91 %
Lymphs Abs: 14.7 10*3/uL — ABNORMAL HIGH (ref 0.7–4.0)
MCH: 37.1 pg — ABNORMAL HIGH (ref 26.0–34.0)
MCHC: 33.3 g/dL (ref 30.0–36.0)
MCV: 111.3 fL — ABNORMAL HIGH (ref 78.0–100.0)
Monocytes Absolute: 0.1 10*3/uL (ref 0.1–1.0)
Monocytes Relative: 1 %
Neutro Abs: 1.3 10*3/uL — ABNORMAL LOW (ref 1.7–7.7)
Neutrophils Relative %: 8 %
Platelets: 104 10*3/uL — ABNORMAL LOW (ref 150–400)
RBC: 2.4 MIL/uL — ABNORMAL LOW (ref 3.87–5.11)
RDW: 15.2 % (ref 11.5–15.5)
WBC: 16.1 10*3/uL — ABNORMAL HIGH (ref 4.0–10.5)

## 2018-02-26 LAB — PROTIME-INR
INR: 1.31
Prothrombin Time: 16.1 seconds — ABNORMAL HIGH (ref 11.4–15.2)

## 2018-02-26 MED ORDER — FENTANYL CITRATE (PF) 100 MCG/2ML IJ SOLN
INTRAMUSCULAR | Status: AC
  Filled 2018-02-26: qty 2

## 2018-02-26 MED ORDER — MIDAZOLAM HCL 2 MG/2ML IJ SOLN
INTRAMUSCULAR | Status: AC
  Filled 2018-02-26: qty 4

## 2018-02-26 MED ORDER — MIDAZOLAM HCL 2 MG/2ML IJ SOLN
INTRAMUSCULAR | Status: AC | PRN
  Administered 2018-02-26 (×3): 1 mg via INTRAVENOUS

## 2018-02-26 MED ORDER — LIDOCAINE HCL (PF) 1 % IJ SOLN
INTRAMUSCULAR | Status: AC | PRN
  Administered 2018-02-26: 10 mL

## 2018-02-26 MED ORDER — SODIUM CHLORIDE 0.9 % IV SOLN
INTRAVENOUS | Status: DC
  Administered 2018-02-26: 08:00:00 via INTRAVENOUS

## 2018-02-26 MED ORDER — FENTANYL CITRATE (PF) 100 MCG/2ML IJ SOLN
INTRAMUSCULAR | Status: AC | PRN
  Administered 2018-02-26 (×2): 50 ug via INTRAVENOUS

## 2018-02-26 NOTE — Discharge Instructions (Signed)
Moderate Conscious Sedation, Adult, Care After These instructions provide you with information about caring for yourself after your procedure. Your health care provider may also give you more specific instructions. Your treatment has been planned according to current medical practices, but problems sometimes occur. Call your health care provider if you have any problems or questions after your procedure. What can I expect after the procedure? After your procedure, it is common:  To feel sleepy for several hours.  To feel clumsy and have poor balance for several hours.  To have poor judgment for several hours.  To vomit if you eat too soon.  Follow these instructions at home: For at least 24 hours after the procedure:   Do not: ? Participate in activities where you could fall or become injured. ? Drive. ? Use heavy machinery. ? Drink alcohol. ? Take sleeping pills or medicines that cause drowsiness. ? Make important decisions or sign legal documents. ? Take care of children on your own.  Rest. Eating and drinking  Follow the diet recommended by your health care provider.  If you vomit: ? Drink water, juice, or soup when you can drink without vomiting. ? Make sure you have little or no nausea before eating solid foods. General instructions  Have a responsible adult stay with you until you are awake and alert.  Take over-the-counter and prescription medicines only as told by your health care provider.  If you smoke, do not smoke without supervision.  Keep all follow-up visits as told by your health care provider. This is important. Contact a health care provider if:  You keep feeling nauseous or you keep vomiting.  You feel light-headed.  You develop a rash.  You have a fever. Get help right away if:  You have trouble breathing. This information is not intended to replace advice given to you by your health care provider. Make sure you discuss any questions you have  with your health care provider. Document Released: 04/30/2013 Document Revised: 12/13/2015 Document Reviewed: 10/30/2015 Elsevier Interactive Patient Education  2018 [place].   Bone Marrow Aspiration and Bone Marrow Biopsy, Adult, Care After This sheet gives you information about how to care for yourself after your procedure. Your health care provider may also give you more specific instructions. If you have problems or questions, contact your health care provider. What can I expect after the procedure? After the procedure, it is common to have:  Mild pain and tenderness.  Swelling.  Bruising.  Follow these instructions at home:  Take over-the-counter or prescription medicines only as told by your health care provider.  Do not take baths, swim, or use a hot tub until your health care provider approves. Ask if you can take a shower or have a sponge bath.  Follow instructions from your health care provider about how to take care of the puncture site. Make sure you: ? Wash your hands with soap and water before you change your bandage (dressing). If soap and water are not available, use hand sanitizer.  You may shower tomorrow ? Change your dressing as told by your health care provider.  You may remove your dressing tomorrow.  Check your puncture siteevery day for signs of infection. Check for: ? More redness, swelling, or pain. ? More fluid or blood. ? Warmth. ? Pus or a bad smell.  Return to your normal activities as told by your health care provider. Ask your health care provider what activities are safe for you.  Do not drive  for 24 hours if you were given a medicine to help you relax (sedative). °· Keep all follow-up visits as told by your health care provider. This is important. °Contact a health care provider if: °· You have more redness, swelling, or pain around the puncture site. °· You have more fluid or blood coming from the puncture site. °· Your puncture site feels  warm to the touch. °· You have pus or a bad smell coming from the puncture site. °· You have a fever. °· Your pain is not controlled with medicine. °This information is not intended to replace advice given to you by your health care provider. Make sure you discuss any questions you have with your health care provider. °Document Released: 01/27/2005 Document Revised: 01/28/2016 Document Reviewed: 12/22/2015 °Elsevier Interactive Patient Education © 2018 Elsevier Inc. ° °

## 2018-02-26 NOTE — Procedures (Signed)
Pre-procedure Diagnosis: CLL and thrombocytopenia Post-procedure Diagnosis: Same  Technically successful CT guided bone marrow aspiration and biopsy of left iliac crest.   Complications: None Immediate  EBL: None  SignedSandi Mariscal Pager: [phone] 02/26/2018, 9:45 AM

## 2018-02-26 NOTE — H&P (Signed)
 Chief Complaint: Patient was seen in consultation today for  at the request of Dr. Sherrill  Referring Physician(s): Dr. Sherrill  Supervising Physician: Watts, John  Patient Status: [facility] - Out-pt  History of Present Illness: Meagan Walsh is a 57 y.o. female with CLL and thrombocytopenia. She is referred for bone marrow biopsy. PMHx, meds, labs, imaging, allergies reviewed. Feels well, no recent fevers, chills, illness. Has been NPO today as directed. Family at bedside.   Past Medical History:  Diagnosis Date  . Anxiety   . Cancer (HCC)    CLL  . Degenerative disk disease    Neck--chronic pain  . Depression   . Fibroids    Uterine  . 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 Hypothyroidism   . Lymphocytosis   . Mitral valve prolapse   . Mitral valve prolapse   . Thyroid disease    Hypothyroid    Past Surgical History:  Procedure Laterality Date  . BREAST BIOPSY Left 2000  . CESAREAN SECTION  1996  . MELANOMA EXCISION WITH SENTINEL LYMPH NODE BIOPSY Left 10/01/2017   Procedure: EXCISIONAL BIOPSY OF LEFT INGUINAL LYMPH NODE;  Surgeon: Wilson, Eric, MD;  Location: [facility];  Service: General;  Laterality: Left;  . REVISION OF ABDOMINAL SCAR    . TONSILLECTOMY     age 4    Allergies: Patient has no known allergies.  Medications: Prior to Admission medications   Medication Sig Start Date End Date Taking? Authorizing Provider  ALPRAZolam (XANAX) 0.25 MG tablet Take 0.25-0.5 mg by mouth daily as needed. For anxiety 09/16/17  Yes [provider]  fluticasone (FLONASE) 50 MCG/ACT nasal spray  01/23/18  Yes [provider]  lamoTRIgine (LAMICTAL) 200 MG tablet Take 200 mg by mouth 2 (two) times daily. 08/06/17  Yes [provider]  levothyroxine (SYNTHROID, LEVOTHROID) 100 MCG tablet Take 100 mcg by mouth daily.   Yes [provider]  lithium carbonate (ESKALITH) 450 MG CR tablet Take 900 mg by mouth  at bedtime. 09/02/17  Yes [provider]  naproxen sodium (ALEVE) 220 MG tablet Take 220 mg by mouth 2 (two) times daily as needed (for pain.).   Yes [provider]  vortioxetine HBr (TRINTELLIX) 20 MG TABS Take 20 mg by mouth daily.    Yes [provider]  zolpidem (AMBIEN) 10 MG tablet Take 5 mg by mouth at bedtime.    Yes [provider]  levonorgestrel (MIRENA) 20 MCG/24HR IUD 1 each by Intrauterine route once.     [provider]     Family History  Problem Relation Age of Onset  . Breast cancer Mother     Social History   Socioeconomic History  . Marital status: Married    Spouse name: Not on file  . Number of children: Not on file  . Years of education: Not on file  . Highest education level: Not on file  Occupational History  . Not on file  Social Needs  . Financial resource strain: Not on file  . Food insecurity:    Worry: Not on file    Inability: Not on file  . Transportation needs:    Medical: Not on file    Non-medical: Not on file  Tobacco Use  . Smoking status: Never Smoker  . Smokeless tobacco: Never Used  Substance and Sexual Activity  . Alcohol use: Yes    Frequency: Never    Comment: 2   glasses of wine 2 times per week   . Drug use: No  . Sexual activity: Not on file  Lifestyle  . Physical activity:    Days per week: Not on file    Minutes per session: Not on file  . Stress: Not on file  Relationships  . Social connections:    Talks on phone: Not on file    Gets together: Not on file    Attends religious service: Not on file    Active member of club or organization: Not on file    Attends meetings of clubs or organizations: Not on file    Relationship status: Not on file  Other Topics Concern  . Not on file  Social History Narrative   Pt lives in 2 story home with her husband   Has 3 adult children   Masters degree   Works at [employer]     Review of Systems: A 12 point ROS discussed and  pertinent positives are indicated in the HPI above.  All other systems are negative.  Review of Systems  Vital Signs: BP (!) 102/58 (BP Location: Right Arm)   Pulse 73   Temp 97.7 F (36.5 C) (Oral)   Resp 16   SpO2 100%   Physical Exam  Constitutional: She is oriented to person, place, and time. She appears well-developed. No distress.  HENT:  Head: Normocephalic.  Mouth/Throat: Oropharynx is clear and moist.  Neck: Normal range of motion. No JVD present.  Cardiovascular: Normal rate, regular rhythm and normal heart sounds.  Pulmonary/Chest: Effort normal and breath sounds normal. No respiratory distress.  Neurological: She is alert and oriented to person, place, and time.  Skin: Skin is warm and dry.  Psychiatric: She has a normal mood and affect.     Imaging: No results found.  Labs:  CBC: Recent Labs    01/01/18 0905 02/05/18 0912 02/20/18 0842 02/26/18 0724  WBC 15.0* 19.2* 19.6* 16.1*  HGB 10.2* 8.8* 8.8* 8.9*  HCT 31.8* 26.5* 27.0* 26.7*  PLT 137* 119* 89* 104*    COAGS: Recent Labs    09/18/17 1118 02/26/18 0724  INR 1.33 1.31    BMP: Recent Labs    09/11/17 1602 01/01/18 0905 02/20/18 0842  NA 141 141 140  K 4.3 4.4 4.2  CL 106 107 104  CO2 26 26 29  GLUCOSE 111 100 97  BUN 15 16 20  CALCIUM 10.2 10.4 10.2  CREATININE 1.03 0.93 0.96  GFRNONAA 59* >60 >60  GFRAA >60 >60 >60    LIVER FUNCTION TESTS: Recent Labs    09/11/17 1602 01/01/18 0905 02/20/18 0842  BILITOT 0.5 0.4 0.6  AST 15 15 19  ALT 10 12 18  ALKPHOS 83 75 66  PROT 6.7 6.7 6.5  ALBUMIN 4.5 4.5 4.7    TUMOR MARKERS: No results for input(s): AFPTM, CEA, CA199, CHROMGRNA in the last 8760 hours.  Assessment and Plan: CLL Anemia and thrombocytopenia For CT guided bone marrow biopsy Labs ok Risks and benefits discussed with the patient including, but not limited to bleeding, infection, damage to adjacent structures or low yield requiring additional tests.  All  of the patient's questions were answered, patient is agreeable to proceed. Consent signed and in chart.    Thank you for this interesting consult.  I greatly enjoyed meeting Anelisse K Manahan and look forward to participating in their care.  A copy of this report was sent to the requesting provider on this date.    Electronically Signed: BRUNING, KEVIN, PA-C 02/26/2018, 8:53 AM   I spent a total of 20 minutes in face to face in clinical consultation, greater than 50% of which was counseling/coordinating care for bone marrow biopsy  

## 2018-03-01 ENCOUNTER — Other Ambulatory Visit: Payer: Self-pay | Admitting: Nurse Practitioner

## 2018-03-01 ENCOUNTER — Encounter: Payer: Self-pay | Admitting: Oncology

## 2018-03-01 ENCOUNTER — Inpatient Hospital Stay (HOSPITAL_BASED_OUTPATIENT_CLINIC_OR_DEPARTMENT_OTHER): Payer: BLUE CROSS/BLUE SHIELD | Admitting: Oncology

## 2018-03-01 ENCOUNTER — Inpatient Hospital Stay: Payer: BLUE CROSS/BLUE SHIELD | Admitting: Oncology

## 2018-03-01 ENCOUNTER — Telehealth: Payer: Self-pay | Admitting: Oncology

## 2018-03-01 ENCOUNTER — Inpatient Hospital Stay: Payer: BLUE CROSS/BLUE SHIELD

## 2018-03-01 ENCOUNTER — Inpatient Hospital Stay: Payer: BLUE CROSS/BLUE SHIELD | Attending: Oncology

## 2018-03-01 VITALS — BP 100/57 | HR 87 | Temp 97.7°F | Resp 18 | Ht 66.0 in | Wt 122.6 lb

## 2018-03-01 DIAGNOSIS — D630 Anemia in neoplastic disease: Secondary | ICD-10-CM | POA: Insufficient documentation

## 2018-03-01 DIAGNOSIS — D801 Nonfamilial hypogammaglobulinemia: Secondary | ICD-10-CM

## 2018-03-01 DIAGNOSIS — D696 Thrombocytopenia, unspecified: Secondary | ICD-10-CM | POA: Insufficient documentation

## 2018-03-01 DIAGNOSIS — Z8781 Personal history of (healed) traumatic fracture: Secondary | ICD-10-CM | POA: Diagnosis not present

## 2018-03-01 DIAGNOSIS — Z23 Encounter for immunization: Secondary | ICD-10-CM | POA: Diagnosis not present

## 2018-03-01 DIAGNOSIS — C9110 Chronic lymphocytic leukemia of B-cell type not having achieved remission: Secondary | ICD-10-CM

## 2018-03-01 DIAGNOSIS — C9190 Lymphoid leukemia, unspecified not having achieved remission: Secondary | ICD-10-CM

## 2018-03-01 DIAGNOSIS — Z8619 Personal history of other infectious and parasitic diseases: Secondary | ICD-10-CM

## 2018-03-01 DIAGNOSIS — E039 Hypothyroidism, unspecified: Secondary | ICD-10-CM | POA: Diagnosis not present

## 2018-03-01 MED ORDER — PNEUMOCOCCAL 13-VAL CONJ VACC IM SUSP
0.5000 mL | Freq: Once | INTRAMUSCULAR | Status: AC
  Administered 2018-03-01: 0.5 mL via INTRAMUSCULAR
  Filled 2018-03-01: qty 0.5

## 2018-03-01 NOTE — Progress Notes (Signed)
[place] OFFICE PROGRESS NOTE   Diagnosis: CLL  INTERVAL HISTORY:   Meagan Walsh returns as scheduled.  She feels well.  No dyspnea.  No fever, night sweats, or anorexia.  Good energy level. She underwent a bone marrow biopsy 02/26/2018.  She reports tolerating the procedure well.  The pathology confirmed a hypercellular marrow with extensive involvement by CLL.  Flow cytometry confirmed a monoclonal lambda restricted B-cell population expressing CD20 and CD5.    Objective:  Vital signs in last 24 hours:  There were no vitals taken for this visit.    HEENT: Neck without mass Lymphatics: Less than 1 cm submental nodes.  Pea-sized bilateral inguinal nodes. Resp: Lungs clear bilaterally Cardio: Regular rate and rhythm GI: No hepatomegaly, spleen tip left lateral abdomen? Vascular: No leg edema   Lab Results:  Lab Results  Component Value Date   WBC 16.1 (H) 02/26/2018   HGB 8.9 (L) 02/26/2018   HCT 26.7 (L) 02/26/2018   MCV 111.3 (H) 02/26/2018   PLT 104 (L) 02/26/2018   NEUTROABS 1.3 (L) 02/26/2018    CMP  Lab Results  Component Value Date   NA 140 02/20/2018   K 4.2 02/20/2018   CL 104 02/20/2018   CO2 29 02/20/2018   GLUCOSE 97 02/20/2018   BUN 20 02/20/2018   CREATININE 0.96 02/20/2018   CALCIUM 10.2 02/20/2018   PROT 6.5 02/20/2018   ALBUMIN 4.7 02/20/2018   AST 19 02/20/2018   ALT 18 02/20/2018   ALKPHOS 66 02/20/2018   BILITOT 0.6 02/20/2018   GFRNONAA >60 02/20/2018   GFRAA >60 02/20/2018     Imaging:  Ct Biopsy  Result Date: 02/26/2018 INDICATION: History of CLL and thrombocytopenia. Please perform CT-guided bone marrow biopsy for tissue diagnostic purposes. EXAM: CT-GUIDED BONE MARROW BIOPSY AND ASPIRATION MEDICATIONS: None ANESTHESIA/SEDATION: Fentanyl 100 mcg IV; Versed 3 mg IV Sedation Time: 10 Minutes; The patient was continuously monitored during the procedure by the interventional radiology nurse under my direct supervision.  COMPLICATIONS: None immediate. PROCEDURE: Informed consent was obtained from the patient following an explanation of the procedure, risks, benefits and alternatives. The patient understands, agrees and consents for the procedure. All questions were addressed. A time out was performed prior to the initiation of the procedure. The patient was positioned prone and non-contrast localization CT was performed of the pelvis to demonstrate the iliac marrow spaces. The operative site was prepped and draped in the usual sterile fashion. Under sterile conditions and local anesthesia, a 22 gauge spinal needle was utilized for procedural planning. Next, an 11 gauge coaxial bone biopsy needle was advanced into the left iliac marrow space. Needle position was confirmed with CT imaging. Initially, bone marrow aspiration was performed. Next, a bone marrow biopsy was obtained with the 11 gauge outer bone marrow device. Samples were prepared with the cytotechnologist and deemed adequate. The needle was removed intact. Hemostasis was obtained with compression and a dressing was placed. The patient tolerated the procedure well without immediate post procedural complication. IMPRESSION: Successful CT guided left iliac bone marrow aspiration and core biopsy. Electronically Signed   By: Sandi Mariscal M.D.   On: 02/26/2018 11:19   Ct Bone Marrow Biopsy & Aspiration  Result Date: 02/26/2018 INDICATION: History of CLL and thrombocytopenia. Please perform CT-guided bone marrow biopsy for tissue diagnostic purposes. EXAM: CT-GUIDED BONE MARROW BIOPSY AND ASPIRATION MEDICATIONS: None ANESTHESIA/SEDATION: Fentanyl 100 mcg IV; Versed 3 mg IV Sedation Time: 10 Minutes; The patient was continuously monitored during  the procedure by the interventional radiology nurse under my direct supervision. COMPLICATIONS: None immediate. PROCEDURE: Informed consent was obtained from the patient following an explanation of the procedure, risks, benefits and  alternatives. The patient understands, agrees and consents for the procedure. All questions were addressed. A time out was performed prior to the initiation of the procedure. The patient was positioned prone and non-contrast localization CT was performed of the pelvis to demonstrate the iliac marrow spaces. The operative site was prepped and draped in the usual sterile fashion. Under sterile conditions and local anesthesia, a 22 gauge spinal needle was utilized for procedural planning. Next, an 11 gauge coaxial bone biopsy needle was advanced into the left iliac marrow space. Needle position was confirmed with CT imaging. Initially, bone marrow aspiration was performed. Next, a bone marrow biopsy was obtained with the 11 gauge outer bone marrow device. Samples were prepared with the cytotechnologist and deemed adequate. The needle was removed intact. Hemostasis was obtained with compression and a dressing was placed. The patient tolerated the procedure well without immediate post procedural complication. IMPRESSION: Successful CT guided left iliac bone marrow aspiration and core biopsy. Electronically Signed   By: Sandi Mariscal M.D.   On: 02/26/2018 11:19    Medications: I have reviewed the patient's current medications.   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 Mild spinal megaly.  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 Augmentin prescribed. 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  We discussed the indications for treating CLL.  Her only occasion for treatment is anemia.  We discussed systemic treatment options including bendamustine/rituximab, ibrutinib, and Venetoclax.  She does not have high risk molecular changes.  I recommend treatment with bendamustine/rituximab versus single agent ibrutinib.  Meagan Walsh feels well and does not wish to begin treatment at present.  Her hemoglobin has been stable over the past several weeks.  We decided to follow her with observation for now.  The plan is to initiate systemic therapy if the hemoglobin falls or she develops new symptoms.  She will contact us for symptoms of anemia.  Meagan Walsh will return for a CBC in 1 month.  She will be scheduled for an office visit in 2 months.  She received a 13 Valent pneumococcal vaccine today.  She will receive a 23 valent pneumococcal vaccine within the next few months.  30 minutes were spent with the patient today.  The majority of the time was used for counseling and coordination of care.  Betsy Coder, MD  03/01/2018  7:22 AM

## 2018-03-01 NOTE — Telephone Encounter (Signed)
Appts scheduled AVS/Calendar declined per 8/9 los

## 2018-03-01 NOTE — Patient Instructions (Signed)
Bendamustine Injection What is this medicine? BENDAMUSTINE (BEN da MUS teen) is a chemotherapy drug. It is used to treat chronic lymphocytic leukemia and non-Hodgkin lymphoma. This medicine may be used for other purposes; ask your health care provider or pharmacist if you have questions. COMMON BRAND NAME(S): BENDEKA, Treanda What should I tell my health care provider before I take this medicine? They need to know if you have any of these conditions: -infection (especially a virus infection such as chickenpox, cold sores, or herpes) -kidney disease -liver disease -an unusual or allergic reaction to bendamustine, mannitol, other medicines, foods, dyes, or preservatives -pregnant or trying to get pregnant -breast-feeding How should I use this medicine? This medicine is for infusion into a vein. It is given by a health care professional in a hospital or clinic setting. Talk to your pediatrician regarding the use of this medicine in children. Special care may be needed. Overdosage: If you think you have taken too much of this medicine contact a poison control center or emergency room at once. NOTE: This medicine is only for you. Do not share this medicine with others. What if I miss a dose? It is important not to miss your dose. Call your doctor or health care professional if you are unable to keep an appointment. What may interact with this medicine? Do not take this medicine with any of the following medications: -clozapine This medicine may also interact with the following medications: -atazanavir -cimetidine -ciprofloxacin -enoxacin -fluvoxamine -medicines for seizures like carbamazepine and phenobarbital -mexiletine -rifampin -tacrine -thiabendazole -zileuton This list may not describe all possible interactions. Give your health care provider a list of all the medicines, herbs, non-prescription drugs, or dietary supplements you use. Also tell them if you smoke, drink alcohol, or  use illegal drugs. Some items may interact with your medicine. What should I watch for while using this medicine? This drug may make you feel generally unwell. This is not uncommon, as chemotherapy can affect healthy cells as well as cancer cells. Report any side effects. Continue your course of treatment even though you feel ill unless your doctor tells you to stop. You may need blood work done while you are taking this medicine. Call your doctor or health care professional for advice if you get a fever, chills or sore throat, or other symptoms of a cold or flu. Do not treat yourself. This drug decreases your body's ability to fight infections. Try to avoid being around people who are sick. This medicine may increase your risk to bruise or bleed. Call your doctor or health care professional if you notice any unusual bleeding. Talk to your doctor about your risk of cancer. You may be more at risk for certain types of cancers if you take this medicine. Do not become pregnant while taking this medicine or for 3 months after stopping it. Women should inform their doctor if they wish to become pregnant or think they might be pregnant. Men should not father a child while taking this medicine and for 3 months after stopping it.There is a potential for serious side effects to an unborn child. Talk to your health care professional or pharmacist for more information. Do not breast-feed an infant while taking this medicine. This medicine may interfere with the ability to have a child. You should talk with your doctor or health care professional if you are concerned about your fertility. What side effects may I notice from receiving this medicine? Side effects that you should report to your doctor   or health care professional as soon as possible: -allergic reactions like skin rash, itching or hives, swelling of the face, lips, or tongue -low blood counts - this medicine may decrease the number of white blood cells,  red blood cells and platelets. You may be at increased risk for infections and bleeding. -redness, blistering, peeling or loosening of the skin, including inside the mouth -signs of infection - fever or chills, cough, sore throat, pain or difficulty passing urine -signs of decreased platelets or bleeding - bruising, pinpoint red spots on the skin, black, tarry stools, blood in the urine -signs of decreased red blood cells - unusually weak or tired, fainting spells, lightheadedness -signs and symptoms of kidney injury like trouble passing urine or change in the amount of urine -signs and symptoms of liver injury like dark yellow or brown urine; general ill feeling or flu-like symptoms; light-colored stools; loss of appetite; nausea; right upper belly pain; unusually weak or tired; yellowing of the eyes or skin Side effects that usually do not require medical attention (report to your doctor or health care professional if they continue or are bothersome): -constipation -decreased appetite -diarrhea -headache -mouth sores -nausea/vomiting -tiredness This list may not describe all possible side effects. Call your doctor for medical advice about side effects. You may report side effects to FDA at 1-800-FDA-1088. Where should I keep my medicine? This drug is given in a hospital or clinic and will not be stored at home. NOTE: This sheet is a summary. It may not cover all possible information. If you have questions about this medicine, talk to your doctor, pharmacist, or health care provider.  2018 Elsevier/Gold Standard (2015-05-13 08:45:41)  Rituximab; Hyaluronidase injection What is this medicine? RITUXIMAB; HYALURONIDASE (ri TUX i mab / hye al ur ON i dase) is used to treat non-Hodgkin lymphoma and chronic lymphocytic leukemia. Rituximab is a monoclonal antibody. Hyaluronidase is used to improve the effects of rituximab. This medicine may be used for other purposes; ask your health care provider  or pharmacist if you have questions. COMMON BRAND NAME(S): Rituxan Hycela What should I tell my health care provider before I take this medicine? They need to know if you have any of these conditions: -heart disease -infection (especially a virus infection such as hepatitis B, chickenpox, cold sores, or herpes) -immune system problems -irregular heartbeat -kidney disease -lung or breathing disease, like asthma -recently received or scheduled to receive a vaccine -an unusual or allergic reaction to rituximab, rituximab/hyaluronidase, mouse proteins, other medicines, foods, dyes, or preservatives -pregnant or trying to get pregnant -breast-feeding How should I use this medicine? This medicine is for injection under the skin. It is given by a health care professional in a hospital or clinic setting. A special MedGuide will be given to you before each treatment. Be sure to read this information carefully each time. Talk to your pediatrician regarding the use of this medicine in children. Special care may be needed. Overdosage: If you think you have taken too much of this medicine contact a poison control center or emergency room at once. NOTE: This medicine is only for you. Do not share this medicine with others. What if I miss a dose? It is important not to miss your dose. Call your doctor or health care professional if you are unable to keep an appointment. What may interact with this medicine? This medicine may interact with the following medications: -cisplatin -live virus vaccines This list may not describe all possible interactions. Give your health care  provider a list of all the medicines, herbs, non-prescription drugs, or dietary supplements you use. Also tell them if you smoke, drink alcohol, or use illegal drugs. Some items may interact with your medicine. What should I watch for while using this medicine? Your condition will be monitored carefully while you are receiving this  medicine. You may need blood work done while you are taking this medicine. This medicine can cause serious allergic reactions. To reduce your risk you may need to take medicine before treatment with this medicine. Take your medicine as directed. In some patients, this medicine may cause a serious brain infection that may cause death. If you have any problems seeing, thinking, speaking, walking, or standing, tell your doctor right away. If you cannot reach your doctor, urgently seek other source of medical care. Call your doctor or health care professional for advice if you get a fever, chills or sore throat, or other symptoms of a cold or flu. Do not treat yourself. This drug decreases your body's ability to fight infections. Try to avoid being around people who are sick. Do not become pregnant while taking this medicine or for 12 months after stopping it. Women should inform their doctor if they wish to become pregnant or think they might be pregnant. There is a potential for serious side effects to an unborn child. Talk to your health care professional or pharmacist for more information. Do not breast-feed an infant while taking this medicine or for at least 6 months after stopping it. What side effects may I notice from receiving this medicine? Side effects that you should report to your doctor or health care professional as soon as possible: -breathing problems -chest pain -dizziness or feeling faint -fast, irregular heartbeat -low blood counts - this medicine may decrease the number of white blood cells, red blood cells and platelets. You may be at increased risk for infections and bleeding. -mouth sores -redness, blistering, peeling or loosening of the skin, including inside the mouth (this can be added for any serious or exfoliative rash that could lead to hospitalization) -signs of infection - fever or chills, cough, sore throat, pain or difficulty passing urine -signs and symptoms of kidney  injury like trouble passing urine or change in the amount of urine -signs and symptoms of liver injury like dark yellow or brown urine; general ill feeling or flu-like symptoms; light-colored stools; loss of appetite; nausea; right upper belly pain; unusually weak or tired; yellowing of the eyes or skin -stomach pain -vomiting Side effects that usually do not require medical attention (report these to your doctor or health care professional if they continue or are bothersome): -constipation -hair loss -headache -muscle cramps or muscle pain -pain, redness, or irritation at site where injected This list may not describe all possible side effects. Call your doctor for medical advice about side effects. You may report side effects to FDA at 1-800-FDA-1088. Where should I keep my medicine? This drug is given in a hospital or clinic and will not be stored at home. NOTE: This sheet is a summary. It may not cover all possible information. If you have questions about this medicine, talk to your doctor, pharmacist, or health care provider.  2018 Elsevier/Gold Standard (2016-02-16 15:36:38)   Ibrutinib capsules What is this medicine? IBRUTINIB (eye BROO ti nib) is a medicine that targets proteins in cancer cells and stops the cancer cells from growing. It is used to treat mantle cell lymphoma, chronic lymphocytic leukemia, small lymphocytic lymphoma, marginal zone  lymphoma, Waldenstrom macroglobulinemia, and chronic graft-versus-host disease. This medicine may be used for other purposes; ask your health care provider or pharmacist if you have questions. COMMON BRAND NAME(S): IMBRUVICA What should I tell my health care provider before I take this medicine? They need to know if you have any of these conditions: -bleeding disorders -diabetes -heart disease -high blood pressure -high cholesterol -history of irregular heartbeat -infection -liver disease -recent surgery -smoke tobacco -take  medicines that treat or prevent blood clots -an unusual or allergic reaction to ibrutinib, other medicines, foods, dyes, or preservatives -pregnant or trying to get pregnant -breast-feeding How should I use this medicine? Take this medicine by mouth with a glass of water. Follow the directions on the prescription label. Do not cut, crush or chew this medicine. Do not take with grapefruit juice or eat [place] oranges. Take your medicine at regular intervals. Do not take it more often than directed. Do not stop taking except on your doctor's advice. Talk to your pediatrician regarding the use of this medicine in children. Special care may be needed. Overdosage: If you think you have taken too much of this medicine contact a poison control center or emergency room at once. NOTE: This medicine is only for you. Do not share this medicine with others. What if I miss a dose? If you miss a dose, take it as soon as you can. If it is almost time for your next dose, take only that dose. Do not take double or extra doses. What may interact with this medicine? This medicine may interact with the following medications: -antiviral medications for HIV or AIDS -aprepitant -boceprevir -calcium channel blockers like diltiazem and verapamil -certain antibiotics like clarithromycin, erythromycin, and troleandomycin -certain medicines for fungal infections like fluconazole, ketoconazole, itraconazole, posaconazole, and voriconazole -certain medicines for seizures like carbamazepine and phenytoin -cimetidine -ciprofloxacin -clotrimazole -conivaptan -crizotinib -cyclosporine -digoxin -dronedarone -enzalutamide -fluvoxamine -grapefruit juice or [place] oranges -idelalisib -imatinib -methotrexate -mitotane -nefazodone -rifampin -St. John's wort -tofisopam This list may not describe all possible interactions. Give your health care provider a list of all the medicines, herbs, non-prescription drugs, or  dietary supplements you use. Also tell them if you smoke, drink alcohol, or use illegal drugs. Some items may interact with your medicine. What should I watch for while using this medicine? You may need blood work done while you are taking this medicine. This medicine may increase your risk to bruise or bleed. Call your doctor or health care professional if you notice any unusual bleeding. Call your doctor or health care professional for advice if you get a fever, chills or sore throat, or other symptoms of a cold or flu. Do not treat yourself. This drug decreases your body's ability to fight infections. Try to avoid being around people who are sick. If you are going to have surgery or any other procedures, tell your doctor you are taking this medicine. Tell your dentist and dental surgeon that you are taking this medicine. You should not have major dental surgery while on this medicine. See your dentist to have a dental exam and fix any dental problems before starting this medicine. Talk to your doctor about your risk of cancer. You may be more at risk for certain types of cancers if you take this medicine. Do not become pregnant while taking this medicine or for 1 month after stopping it. Women should inform their doctor if they wish to become pregnant or think they might be pregnant. Men should not father a  child while taking this medicine and for 1 month after stopping it. There is a potential for serious side effects to an unborn child. Talk to your health care professional or pharmacist for more information. What side effects may I notice from receiving this medicine? Side effects that you should report to your doctor or health care professional as soon as possible: -allergic reactions like skin rash, itching or hives, swelling of the face, lips, or tongue -low blood counts - this medicine may decrease the number of white blood cells, red blood cells and platelets. You may be at increased risk for  infections and bleeding -signs or symptoms of bleeding such as bloody or black, tarry stools; red or dark-brown urine; spitting up blood or brown material that looks like coffee grounds; red spots on the skin; unusual bruising or bleeding from the eye, gums, or nose; confusion; trouble speaking or understanding; severe headaches; weakness; or dizziness -signs and symptoms of a dangerous change in heartbeat or heart rhythm like chest pain; dizziness; fast or irregular heartbeat; palpitations; feeling faint or lightheaded, falls; breathing problems -signs and symptoms of infection like fever or chills; cough; sore throat; or pain when urinating -signs and symptoms of kidney injury like trouble passing urine or change in the amount of urine Side effects that usually do not require medical attention (report to your doctor or health care professional if they continue or are bothersome): -bone pain -diarrhea -mouth sores -muscle cramps -muscle pain -nausea -tiredness This list may not describe all possible side effects. Call your doctor for medical advice about side effects. You may report side effects to FDA at 1-800-FDA-1088. Where should I keep my medicine? Keep out of the reach of children. Store between 20 and 25 degrees C (68 and 77 degrees F). Keep this medicine in the original container. Throw away any unused medicine after the expiration date. NOTE: This sheet is a summary. It may not cover all possible information. If you have questions about this medicine, talk to your doctor, pharmacist, or health care provider.  2018 Elsevier/Gold Standard (2016-05-25 18:48:51)   Venetoclax oral tablets What is this medicine? VENETOCLAX (ven et oh klax) is a medicine that targets proteins in cancer cells and stops the cancer cells from growing. It is used to treat chronic lymphocytic leukemia. This medicine may be used for other purposes; ask your health care provider or pharmacist if you have  questions. COMMON BRAND NAME(S): Venclexta What should I tell my health care provider before I take this medicine? They need to know if you have any of these conditions: -gout -kidney disease -liver disease -high levels of uric acid in the blood -low or high levels of potassium, phosphorus, or calcium in the blood -scheduled to receive a vaccine -an unusual or allergic reaction to venetoclax, other medicines, foods, dyes, or preservatives -pregnant or trying to get pregnant -breast-feeding How should I use this medicine? Take this medicine by mouth with food and a glass of water. Follow the directions on the prescription label. Do not cut, crush, or chew this medicine. Do not take with grapefruit juice or eat [place] oranges or starfruit. Take your medicine at regular intervals. Do not take it more often than directed. Do not stop taking except on your doctor's advice. A special MedGuide will be given to you by the pharmacist with each prescription and refill. Be sure to read this information carefully each time. Talk to your pediatrician regarding the use of this medicine in children. Special care  may be needed. Overdosage: If you think you have taken too much of this medicine contact a poison control center or emergency room at once. NOTE: This medicine is only for you. Do not share this medicine with others. What if I miss a dose? If you miss a dose, take it as soon as you can. If your next dose is to be taken in less than 16 hours, then do not take the missed dose. Take the next dose at your regular time. Do not take double or extra doses. If you vomit after a dose, do not take another dose; take the next day's dose at the usual time. What may interact with this medicine? This medicine may interact with the following medications: -bosentan -calcium channel blockers like diltiazem and verapamil -captopril -carvedilol -certain antibiotics like azithromycin, erythromycin, and  clarithromycin -certain medicines for fungal infections like fluconazole, itraconazole, ketoconazole, posaconazole, and voriconazole -certain medicines for irregular heart beat like amiodarone, dronedarone, and quinidine -certain medicines for seizures like carbamazepine and phenytoin -ciprofloxacin -conivaptan -cyclosporine -digoxin -efavirenz -etravirine -everolimus -felodipine -grapefruit products, [place] oranges, or starfruit -indinavir -live virus vaccines -lopinavir -modafinil -nafcillin -quercetin -ranolazine -rifampin -ritonavir -sirolimus -St. John's wort -telaprevir -ticagrelor -warfarin This list may not describe all possible interactions. Give your health care provider a list of all the medicines, herbs, non-prescription drugs, or dietary supplements you use. Also tell them if you smoke, drink alcohol, or use illegal drugs. Some items may interact with your medicine. What should I watch for while using this medicine? This medicine can cause serious reactions. To reduce your risk you will need to take other medicine(s) before treatment with this medicine. Take your medicine as directed. You may need blood work done while you are taking this medicine. Drink plenty of fluids while you are taking this medicine. Call your doctor or health care professional for advice if you get a fever, chills or sore throat, or other symptoms of a cold or flu. Do not treat yourself. This drug decreases your body's ability to fight infections. Try to avoid being around people who are sick. Do not become pregnant while taking this medicine or for 30 days after stopping it. Women should inform their doctor if they wish to become pregnant or think they might be pregnant. There is a potential for serious side effects to an unborn child. Talk to your health care professional or pharmacist for more information. Do not breast-feed an infant while taking this medicine. This may interfere with the  ability to father a child. You should talk to your doctor or health care professional if you are concerned about your fertility. What side effects may I notice from receiving this medicine? Side effects that you should report to your doctor or health care professional as soon as possible: -allergic reactions like skin rash, itching or hives, swelling of the face, lips, or tongue -low blood counts - this medicine may decrease the number of white blood cells, red blood cells and platelets. You may be at increased risk for infections and bleeding -signs and symptoms of infection like fever or chills; cough; sore throat; or pain when urinating -signs and symptoms of tumor lysis syndrome like nausea, vomiting, confusion, shortness of breath, seizures, irregular heartbeat, dark urine, tiredness, muscle pain, and/or joint pain Side effects that usually do not require medical attention (report to your doctor or health care professional if they continue or are bothersome): -back pain -constipation -diarrhea -headache -swelling of the ankles, feet, hands This list may  not describe all possible side effects. Call your doctor for medical advice about side effects. You may report side effects to FDA at 1-800-FDA-1088. Where should I keep my medicine? Keep out of the reach of children. Store below 30 degrees C (86 degrees F). Keep this medicine in the original package during the first 4 weeks of treatment. Throw away any unused medicine after the expiration date. NOTE: This sheet is a summary. It may not cover all possible information. If you have questions about this medicine, talk to your doctor, pharmacist, or health care provider.  2018 Elsevier/Gold Standard (2016-02-11 15:46:48)

## 2018-03-06 ENCOUNTER — Encounter (HOSPITAL_COMMUNITY): Payer: Self-pay | Admitting: Oncology

## 2018-03-18 ENCOUNTER — Telehealth: Payer: Self-pay | Admitting: *Deleted

## 2018-03-18 NOTE — Telephone Encounter (Signed)
Patient called to report she has discovered a lump in her right groin area. No pain, inflammation or drainage. Per Dr. Benay Spice ok to wait until September visit but she is to call us sooner if area becomes painful.

## 2018-04-01 ENCOUNTER — Telehealth: Payer: Self-pay | Admitting: Pharmacist

## 2018-04-01 ENCOUNTER — Inpatient Hospital Stay: Payer: BLUE CROSS/BLUE SHIELD | Attending: Oncology

## 2018-04-01 ENCOUNTER — Telehealth: Payer: Self-pay | Admitting: Nurse Practitioner

## 2018-04-01 ENCOUNTER — Other Ambulatory Visit: Payer: Self-pay | Admitting: *Deleted

## 2018-04-01 ENCOUNTER — Inpatient Hospital Stay: Payer: BLUE CROSS/BLUE SHIELD

## 2018-04-01 ENCOUNTER — Inpatient Hospital Stay (HOSPITAL_BASED_OUTPATIENT_CLINIC_OR_DEPARTMENT_OTHER): Payer: BLUE CROSS/BLUE SHIELD | Admitting: Oncology

## 2018-04-01 ENCOUNTER — Telehealth: Payer: Self-pay | Admitting: *Deleted

## 2018-04-01 DIAGNOSIS — C9110 Chronic lymphocytic leukemia of B-cell type not having achieved remission: Secondary | ICD-10-CM

## 2018-04-01 DIAGNOSIS — D630 Anemia in neoplastic disease: Secondary | ICD-10-CM | POA: Diagnosis not present

## 2018-04-01 DIAGNOSIS — R0602 Shortness of breath: Secondary | ICD-10-CM | POA: Insufficient documentation

## 2018-04-01 DIAGNOSIS — C9190 Lymphoid leukemia, unspecified not having achieved remission: Secondary | ICD-10-CM

## 2018-04-01 DIAGNOSIS — J189 Pneumonia, unspecified organism: Secondary | ICD-10-CM | POA: Insufficient documentation

## 2018-04-01 DIAGNOSIS — D801 Nonfamilial hypogammaglobulinemia: Secondary | ICD-10-CM

## 2018-04-01 DIAGNOSIS — E039 Hypothyroidism, unspecified: Secondary | ICD-10-CM | POA: Diagnosis not present

## 2018-04-01 DIAGNOSIS — R599 Enlarged lymph nodes, unspecified: Secondary | ICD-10-CM | POA: Diagnosis not present

## 2018-04-01 LAB — CBC WITH DIFFERENTIAL (CANCER CENTER ONLY)
Basophils Absolute: 0 10*3/uL (ref 0.0–0.1)
Basophils Relative: 0 %
Eosinophils Absolute: 0 10*3/uL (ref 0.0–0.5)
Eosinophils Relative: 0 %
HCT: 20.3 % — ABNORMAL LOW (ref 34.8–46.6)
Hemoglobin: 6.6 g/dL — CL (ref 11.6–15.9)
Lymphocytes Relative: 95 %
Lymphs Abs: 12.4 10*3/uL — ABNORMAL HIGH (ref 0.9–3.3)
MCH: 37.5 pg — ABNORMAL HIGH (ref 25.1–34.0)
MCHC: 32.5 g/dL (ref 31.5–36.0)
MCV: 115.3 fL — ABNORMAL HIGH (ref 79.5–101.0)
Monocytes Absolute: 0.1 10*3/uL (ref 0.1–0.9)
Monocytes Relative: 1 %
Neutro Abs: 0.5 10*3/uL — ABNORMAL LOW (ref 1.5–6.5)
Neutrophils Relative %: 4 %
Platelet Count: 55 10*3/uL — ABNORMAL LOW (ref 145–400)
RBC: 1.76 MIL/uL — ABNORMAL LOW (ref 3.70–5.45)
RDW: 15.7 % — ABNORMAL HIGH (ref 11.2–14.5)
WBC Count: 13.1 10*3/uL — ABNORMAL HIGH (ref 3.9–10.3)

## 2018-04-01 LAB — ABO/RH: ABO/RH(D): O POS

## 2018-04-01 LAB — PREPARE RBC (CROSSMATCH)

## 2018-04-01 MED ORDER — IBRUTINIB 420 MG PO TABS: 420.0000 mg | ORAL_TABLET | Freq: Every day | ORAL | 0 refills | Status: DC

## 2018-04-01 MED ORDER — SODIUM CHLORIDE 0.9% IV SOLUTION
250.0000 mL | Freq: Once | INTRAVENOUS | Status: AC
  Administered 2018-04-01: 250 mL via INTRAVENOUS
  Filled 2018-04-01: qty 250

## 2018-04-01 NOTE — Progress Notes (Signed)
Telephone call to patient and husband. Messages left for patient to return call, return to clinic. Blood transfusion set up for 1pm today with lab at 12 for type and screen.   Spoke to patient's husband- patient will return for labs at noon, transfusion at 1 and will see Dr. Benay Spice in between. Appt will be cancelled for tomorrow.

## 2018-04-01 NOTE — Telephone Encounter (Signed)
Scheduled appt per 9/9 los - pt is aware of appt date and time.

## 2018-04-01 NOTE — Progress Notes (Signed)
  [place] OFFICE PROGRESS NOTE   Diagnosis: CLL  INTERVAL HISTORY:   Ms. Vivian returns prior to a scheduled visit.  She has noted an enlarged lymph node in the right groin.  She complains of exertional dyspnea for the past few days.  She notes her heart racing with activity.  No fever or night sweats.  Objective:  Vital signs in last 24 hours:  There were no vitals taken for this visit.    HEENT: Neck without mass Lymphatics: Less than 1 cm mobile submental node, no cervical nodes, 1 cm mobile left axillary node, 2 cm right inguinal node Resp: Lungs clear bilaterally Cardio: Regular rate and rhythm GI: No hepatosplenomegaly Vascular: No leg edema  Skin: No rash    Lab Results:  Lab Results  Component Value Date   WBC 13.1 (H) 04/01/2018   HGB 6.6 (LL) 04/01/2018   HCT 20.3 (L) 04/01/2018   MCV 115.3 (H) 04/01/2018   PLT 55 (L) 04/01/2018   NEUTROABS 0.5 (L) 04/01/2018    CMP  Lab Results  Component Value Date   NA 140 02/20/2018   K 4.2 02/20/2018   CL 104 02/20/2018   CO2 29 02/20/2018   GLUCOSE 97 02/20/2018   BUN 20 02/20/2018   CREATININE 0.96 02/20/2018   CALCIUM 10.2 02/20/2018   PROT 6.5 02/20/2018   ALBUMIN 4.7 02/20/2018   AST 19 02/20/2018   ALT 18 02/20/2018   ALKPHOS 66 02/20/2018   BILITOT 0.6 02/20/2018   GFRNONAA >60 02/20/2018   GFRAA >60 02/20/2018    Medications: I have reviewed the patient's current medications.   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 Mild spinal megaly.  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) deletion, no evidence of 11q22 deletion  Bone marrow biopsy 02/26/2018-hypercellular marrow with extensive involvement by CLL, lymphocytes represent 85% of all cells 2.Hypothyroidism 3.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 Augmentin prescribed. 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 Sandberg has symptomatic anemia secondary to CLL.  She will be transfused 2 units of packed red blood cells today. She has the anemia/thrombocytopenia now causing symptoms.  I recommend treating the CLL.  We discussed bendamustine/rituximab and ibrutinib.  We reviewed potential toxicities associated with these regimens.  She prefers ibrutinib.  She will meet with the Cancer center pharmacist today.  The plan is to begin ibrutinib as soon as she gets insurance approval.  30 minutes were spent with the patient today.  The majority of the time was used for counseling and coordination of care.  Betsy Coder, MD  04/01/2018  2:23 PM

## 2018-04-01 NOTE — Telephone Encounter (Signed)
Oral Oncology Pharmacist Encounter  Received new prescription for Imbruvica (ibrutinib) for the treatment of chronic lymphocytic leukemia, planned duration until disease progression or unacceptable toxicity.  Labs from Epic assessed, no renal or hepatic dysfunction noted. Noted pltc=55k, Hgb=6.6, patient receiving 2 units red blood cells in infusion room today  BPs reviewed, all readings WNL, will continue to be monitored. No cardiac dysfunction noted. Electrolytes will be closely monitored.  Current medication list in Epic reviewed, no significant DDIs with Imbruvica identified.  Prescription has been e-scribed to the [facility] for benefits analysis and approval.  Oral Oncology Clinic will continue to follow for insurance authorization, copayment issues, initial counseling and start date.  Johny Drilling, PharmD, BCPS, BCOP  04/01/2018 3:31 PM Oral Oncology Clinic [phone]

## 2018-04-01 NOTE — Telephone Encounter (Signed)
Oral Chemotherapy Pharmacist Encounter   I spoke with patient and husband in infusion room for overview of: Imbruvica (ibrutinib).   Counseled patient on administration, dosing, side effects, monitoring, drug-food interactions, safe handling, storage, and disposal.  Patient will take Imbruvica 420mg  tablets, 1 tablet (420mg ) by mouth once daily.  Patient will take Imbruvica at approximately the same time each day with a full glass of water and maintain adequate hydration throughout the day. Patient endorses very little water intake on a daily basis. She will start to gradually increase her water intake each day.  Patient knows to avoid grapefruit or grapefruit juice while on therapy with Imbruvica. Patient does greatly enjoy grapefruit and grapefruit juice, is willing to discontinue consumption.  Imbruvica start date: TBD, pending medication acquisition, anticipate ~04/03/18  Adverse effects include but are not limited to: bruising, decreased blood counts, N/V, diarrhea, musculoskeletal pain, arthralgias, peripheral edema, and hemorrhage.   Patient will obtain anti diarrheal and alert the office of 4 or more loose stools above baseline.  We extensively discussed decreased platelet count and signs and symptoms of bleeding that would necessitate a call to the office.  Patient endorses history of mitral valve prolapse that was not otherwise indicated in the chart. We discussed signs and symptoms of abnormal heart beat and when to initiate a call to the office.  Reviewed with patient importance of keeping a medication schedule and plan for any missed doses.  Mr. and Mrs. Anguilla voiced understanding and appreciation.   All questions answered. Medication reconciliation performed and medication/allergy list updated.  Insurance authorization has been submitted, remains pending. We will follow-up with patient for continued medication acquisition once insurance authorization is obtained and  preferred dispensing pharmacy is known.  Patient knows to call the office with questions or concerns. Oral Oncology Clinic will continue to follow.  Johny Drilling, PharmD, BCPS, BCOP  04/01/2018   3:38 PM Oral Oncology Clinic [phone]

## 2018-04-01 NOTE — Patient Instructions (Signed)

## 2018-04-01 NOTE — Telephone Encounter (Signed)
9:25Am  Received TC from patient. She is in the lobby right now for lab appt. And she thought she had an appt with Dr. Benay Spice today. Per Dr. Gearldine Shown note from 03/01/18, her next appt would be 2 months (October). Pt states she needs to see him before that as she has new symptoms-rash, bruising, lumps in groin.    Spoke with Dr. Benay Spice. He states he can see her tomorrow morning @ 8am.  Spoke with pt and her husband in the lobby and relayed the visit date and time. They voiced understanding.  High priority scheduling message sent for tomorrow's appt.

## 2018-04-02 ENCOUNTER — Ambulatory Visit: Payer: BLUE CROSS/BLUE SHIELD | Admitting: Oncology

## 2018-04-02 LAB — TYPE AND SCREEN
ABO/RH(D): O POS
Antibody Screen: NEGATIVE
Unit division: 0
Unit division: 0

## 2018-04-02 LAB — BPAM RBC
Blood Product Expiration Date: 201910072359
Blood Product Expiration Date: 201910072359
ISSUE DATE / TIME: 201909091335
ISSUE DATE / TIME: 201909091335
Unit Type and Rh: 5100
Unit Type and Rh: 5100

## 2018-04-03 ENCOUNTER — Telehealth: Payer: Self-pay

## 2018-04-03 MED FILL — IMBRUVICA 420 MG TAB: 420 | 28 days supply | Qty: 28 | Fill #0

## 2018-04-03 NOTE — Telephone Encounter (Signed)
Oral Oncology Patient Advocate Encounter  Prior Authorization for Kate Sable has been approved.    PA# [id number] Effective dates: 04/01/18 through 03/31/19  Oral Oncology Clinic will continue to follow.   [place] Patient [place] Phone [phone] Fax [phone]

## 2018-04-03 NOTE — Telephone Encounter (Signed)
thanks

## 2018-04-03 NOTE — Telephone Encounter (Addendum)
Oral Oncology Patient Advocate Encounter  Imbruvica copay is $100. I was able to get a copay card which will make the copay $10.  The copay card information is as follows and has been shared with Meagan Walsh.  BIN: [id number] Group: [id number] ID: [id number]  Meagan Walsh is aware of this and she expressed great appreciation. She also stated she will pick up today 04/03/18.  [place] Patient [name] Phone [phone] Fax [phone]

## 2018-04-03 NOTE — Telephone Encounter (Signed)
Oral Oncology Patient Advocate Encounter  Received notification from [facility] that prior authorization for Imbruvica is required.  PA submitted on CoverMyMeds Key [id number] Status is pending  Oral Oncology Clinic will continue to follow.  [place] Patient [place] Phone [phone] Fax [phone]

## 2018-04-03 NOTE — Telephone Encounter (Signed)
Oral Oncology Patient Advocate Encounter  Confirmed with [place] that Imbruvica was picked up on 04/03/18   [place] Patient [place] Phone [phone] Fax [phone]

## 2018-04-10 ENCOUNTER — Telehealth: Payer: Self-pay | Admitting: *Deleted

## 2018-04-10 NOTE — Telephone Encounter (Signed)
Received call from pt stating that she has some symptoms that she wants to discuss with Dr Benay Spice.  She has a swollen L top of foot & hurts to walk on.  She states visibly swollen & slightly red but no tenderness to touch or heat. She has a sharp pain in her R shoulder which started @ 3 days ago.  She also has some pain in her L hand "pinky side" that goes down past her wrist. She denies any injuries.  She doesn't think this is related to the Imbruvica but wants to check with Dr Benay Spice.  She had some vomiting this am but relates it to taking advil & states she doesn't feel nauseated.  She is in [place].  Call back # is [phone] or husband's cell is [phone].  Message to Dr Benay Spice.

## 2018-04-11 ENCOUNTER — Telehealth: Payer: Self-pay | Admitting: Emergency Medicine

## 2018-04-11 ENCOUNTER — Encounter: Payer: Self-pay | Admitting: *Deleted

## 2018-04-11 NOTE — Telephone Encounter (Signed)
Dr.sherrill spoke to pt on 9/18 regarding her new symptoms. He states she has no fever, no leg swelling. Foot was red and swollen but better when he spoke to her yesterday. No rash or bruising.   Pt called today c/o of new pain to wrist and knee. md advised that she stop taking ibrutinib until lab draw on Monday. I left this information on her VM. Awaiting return call

## 2018-04-15 ENCOUNTER — Inpatient Hospital Stay: Payer: BLUE CROSS/BLUE SHIELD

## 2018-04-15 DIAGNOSIS — R599 Enlarged lymph nodes, unspecified: Secondary | ICD-10-CM | POA: Diagnosis not present

## 2018-04-15 DIAGNOSIS — C9110 Chronic lymphocytic leukemia of B-cell type not having achieved remission: Secondary | ICD-10-CM

## 2018-04-15 DIAGNOSIS — D801 Nonfamilial hypogammaglobulinemia: Secondary | ICD-10-CM | POA: Diagnosis not present

## 2018-04-15 DIAGNOSIS — C9190 Lymphoid leukemia, unspecified not having achieved remission: Secondary | ICD-10-CM | POA: Diagnosis not present

## 2018-04-15 DIAGNOSIS — J189 Pneumonia, unspecified organism: Secondary | ICD-10-CM | POA: Diagnosis not present

## 2018-04-15 DIAGNOSIS — E039 Hypothyroidism, unspecified: Secondary | ICD-10-CM | POA: Diagnosis not present

## 2018-04-15 DIAGNOSIS — R0602 Shortness of breath: Secondary | ICD-10-CM | POA: Diagnosis not present

## 2018-04-15 DIAGNOSIS — D630 Anemia in neoplastic disease: Secondary | ICD-10-CM | POA: Diagnosis not present

## 2018-04-15 LAB — CBC WITH DIFFERENTIAL (CANCER CENTER ONLY)
Basophils Absolute: 0 10*3/uL (ref 0.0–0.1)
Basophils Relative: 0 %
Eosinophils Absolute: 0 10*3/uL (ref 0.0–0.5)
Eosinophils Relative: 0 %
HCT: 24.3 % — ABNORMAL LOW (ref 34.8–46.6)
Hemoglobin: 8 g/dL — ABNORMAL LOW (ref 11.6–15.9)
Lymphocytes Relative: 96 %
Lymphs Abs: 29.5 10*3/uL — ABNORMAL HIGH (ref 0.9–3.3)
MCH: 34.8 pg — ABNORMAL HIGH (ref 25.1–34.0)
MCHC: 33 g/dL (ref 31.5–36.0)
MCV: 105.2 fL — ABNORMAL HIGH (ref 79.5–101.0)
Monocytes Absolute: 0.3 10*3/uL (ref 0.1–0.9)
Monocytes Relative: 1 %
Neutro Abs: 0.8 10*3/uL — ABNORMAL LOW (ref 1.5–6.5)
Neutrophils Relative %: 3 %
Platelet Count: 72 10*3/uL — ABNORMAL LOW (ref 145–400)
RBC: 2.31 MIL/uL — ABNORMAL LOW (ref 3.70–5.45)
RDW: 23.3 % — ABNORMAL HIGH (ref 11.2–14.5)
WBC Count: 30.7 10*3/uL — ABNORMAL HIGH (ref 3.9–10.3)

## 2018-04-15 LAB — URIC ACID: Uric Acid, Serum: 5.6 mg/dL (ref 2.5–7.1)

## 2018-04-15 LAB — CMP (CANCER CENTER ONLY)
ALT: 6 U/L (ref 0–44)
AST: 11 U/L — ABNORMAL LOW (ref 15–41)
Albumin: 4.2 g/dL (ref 3.5–5.0)
Alkaline Phosphatase: 61 U/L (ref 38–126)
Anion gap: 8 (ref 5–15)
BUN: 13 mg/dL (ref 6–20)
CO2: 28 mmol/L (ref 22–32)
Calcium: 9.6 mg/dL (ref 8.9–10.3)
Chloride: 106 mmol/L (ref 98–111)
Creatinine: 0.8 mg/dL (ref 0.44–1.00)
GFR, Est AFR Am: >60 mL/min (ref >60)
GFR, Estimated: >60 mL/min (ref >60)
Glucose, Bld: 84 mg/dL (ref 70–99)
Potassium: 4.3 mmol/L (ref 3.5–5.1)
Sodium: 142 mmol/L (ref 135–145)
Total Bilirubin: 0.5 mg/dL (ref 0.3–1.2)
Total Protein: 6.3 g/dL — ABNORMAL LOW (ref 6.5–8.1)

## 2018-04-15 LAB — MAGNESIUM: Magnesium: 2.2 mg/dL (ref 1.7–2.4)

## 2018-04-15 LAB — SAMPLE TO BLOOD BANK

## 2018-04-16 ENCOUNTER — Telehealth: Payer: Self-pay | Admitting: *Deleted

## 2018-04-16 NOTE — Telephone Encounter (Signed)
-----   Message from Owens Shark, NP sent at 04/16/2018  4:18 PM EDT ----- Please call her and see if arthralgias are better since stopping ibrutinib.

## 2018-04-16 NOTE — Telephone Encounter (Signed)
Pt states that her pain is completely gone

## 2018-04-16 NOTE — Telephone Encounter (Signed)
Notified patient that Ned Card, NP has spoken with pharmacy about a dose reduction. Will notify her with instructions in next day or 2.

## 2018-04-17 ENCOUNTER — Telehealth: Payer: Self-pay | Admitting: Emergency Medicine

## 2018-04-17 NOTE — Telephone Encounter (Addendum)
She verbalized understanding of this   ----- Message from Owens Shark, NP sent at 04/17/2018 11:00 AM EDT ----- Please let [name] know to resume ibrutinib at the previous dose.  If she develops recurrent arthralgias please contact the office.  Please also remind her to avoid NSAIDs for pain and use tylenol instead.

## 2018-04-23 ENCOUNTER — Encounter: Payer: Self-pay | Admitting: Nurse Practitioner

## 2018-04-23 ENCOUNTER — Inpatient Hospital Stay: Payer: BLUE CROSS/BLUE SHIELD | Attending: Oncology | Admitting: Nurse Practitioner

## 2018-04-23 ENCOUNTER — Telehealth: Payer: Self-pay

## 2018-04-23 ENCOUNTER — Inpatient Hospital Stay: Payer: BLUE CROSS/BLUE SHIELD

## 2018-04-23 VITALS — BP 113/55 | HR 90 | Temp 98.4°F | Resp 18 | Ht 66.0 in | Wt 124.6 lb

## 2018-04-23 DIAGNOSIS — D631 Anemia in chronic kidney disease: Secondary | ICD-10-CM | POA: Diagnosis not present

## 2018-04-23 DIAGNOSIS — C9190 Lymphoid leukemia, unspecified not having achieved remission: Secondary | ICD-10-CM | POA: Diagnosis not present

## 2018-04-23 DIAGNOSIS — D801 Nonfamilial hypogammaglobulinemia: Secondary | ICD-10-CM | POA: Diagnosis not present

## 2018-04-23 DIAGNOSIS — Z8701 Personal history of pneumonia (recurrent): Secondary | ICD-10-CM | POA: Diagnosis not present

## 2018-04-23 DIAGNOSIS — B1910 Unspecified viral hepatitis B without hepatic coma: Secondary | ICD-10-CM | POA: Insufficient documentation

## 2018-04-23 DIAGNOSIS — E039 Hypothyroidism, unspecified: Secondary | ICD-10-CM | POA: Diagnosis not present

## 2018-04-23 DIAGNOSIS — D696 Thrombocytopenia, unspecified: Secondary | ICD-10-CM | POA: Insufficient documentation

## 2018-04-23 DIAGNOSIS — Z23 Encounter for immunization: Secondary | ICD-10-CM | POA: Insufficient documentation

## 2018-04-23 DIAGNOSIS — C9110 Chronic lymphocytic leukemia of B-cell type not having achieved remission: Secondary | ICD-10-CM

## 2018-04-23 DIAGNOSIS — B1920 Unspecified viral hepatitis C without hepatic coma: Secondary | ICD-10-CM

## 2018-04-23 DIAGNOSIS — M791 Myalgia, unspecified site: Secondary | ICD-10-CM | POA: Diagnosis not present

## 2018-04-23 LAB — CBC WITH DIFFERENTIAL (CANCER CENTER ONLY)
Basophils Absolute: 0 10*3/uL (ref 0.0–0.1)
Basophils Relative: 0 %
Eosinophils Absolute: 0 10*3/uL (ref 0.0–0.5)
Eosinophils Relative: 0 %
HCT: 22.8 % — ABNORMAL LOW (ref 34.8–46.6)
Hemoglobin: 7.3 g/dL — ABNORMAL LOW (ref 11.6–15.9)
Lymphocytes Relative: 95 %
Lymphs Abs: 39.1 10*3/uL — ABNORMAL HIGH (ref 0.9–3.3)
MCH: 35.3 pg — ABNORMAL HIGH (ref 25.1–34.0)
MCHC: 32 g/dL (ref 31.5–36.0)
MCV: 110.1 fL — ABNORMAL HIGH (ref 79.5–101.0)
Monocytes Absolute: 0 10*3/uL — ABNORMAL LOW (ref 0.1–0.9)
Monocytes Relative: 0 %
Neutro Abs: 1.8 10*3/uL (ref 1.5–6.5)
Neutrophils Relative %: 5 %
Platelet Count: 51 10*3/uL — ABNORMAL LOW (ref 145–400)
RBC: 2.07 MIL/uL — ABNORMAL LOW (ref 3.70–5.45)
RDW: 22.4 % — ABNORMAL HIGH (ref 11.2–14.5)
WBC Count: 41 10*3/uL — ABNORMAL HIGH (ref 3.9–10.3)

## 2018-04-23 LAB — CMP (CANCER CENTER ONLY)
ALT: 11 U/L (ref 0–44)
AST: 12 U/L — ABNORMAL LOW (ref 15–41)
Albumin: 4.2 g/dL (ref 3.5–5.0)
Alkaline Phosphatase: 54 U/L (ref 38–126)
Anion gap: 4 — ABNORMAL LOW (ref 5–15)
BUN: 14 mg/dL (ref 6–20)
CO2: 30 mmol/L (ref 22–32)
Calcium: 9.6 mg/dL (ref 8.9–10.3)
Chloride: 106 mmol/L (ref 98–111)
Creatinine: 0.82 mg/dL (ref 0.44–1.00)
GFR, Est AFR Am: >60 mL/min (ref >60)
GFR, Estimated: >60 mL/min (ref >60)
Glucose, Bld: 112 mg/dL — ABNORMAL HIGH (ref 70–99)
Potassium: 4 mmol/L (ref 3.5–5.1)
Sodium: 140 mmol/L (ref 135–145)
Total Bilirubin: 0.6 mg/dL (ref 0.3–1.2)
Total Protein: 6 g/dL — ABNORMAL LOW (ref 6.5–8.1)

## 2018-04-23 LAB — SAMPLE TO BLOOD BANK

## 2018-04-23 MED ORDER — TRAMADOL HCL 50 MG PO TABS: 50.0000 mg | ORAL_TABLET | Freq: Three times a day (TID) | ORAL | 0 refills | Status: DC | PRN

## 2018-04-23 NOTE — Telephone Encounter (Signed)
Printed avs and calender of upcoming appointment. Per 10/1 los 

## 2018-04-23 NOTE — Progress Notes (Addendum)
[place] OFFICE PROGRESS NOTE   Diagnosis: CLL  INTERVAL HISTORY:   Meagan Walsh returns as scheduled.  She began ibrutinib 04/03/2018.  She contacted the office approximately 1 week later due to arthralgias.  Ibrutinib was placed on hold.  The arthralgias resolved.  She resumed ibrutinib 04/16/2018.  She has recurrent pain involving both knees.  She notes improvement with Tylenol.  She notes bruising on both thighs.  She denies bleeding.  No fevers or sweats.  She thinks the right groin lymph node is smaller.  She has mild dyspnea on exertion.  Objective:  Vital signs in last 24 hours:  Blood pressure (!) 113/55, pulse 90, temperature 98.4 F (36.9 C), temperature source Oral, resp. rate 18, height '5\' 6"'  (1.676 m), weight 124 lb 9.6 oz (56.5 kg), SpO2 100 %.    HEENT: Small ecchymoses at the right buccal mucosa.  No thrush. Lymphatics: Tiny submental node.  No palpable supraclavicular or axillary lymph nodes.  1 cm right medial inguinal lymph node. Resp: Lungs clear bilaterally. Cardio: Regular rate and rhythm. GI: Abdomen soft and nontender.  No hepatospleno megaly. Vascular: No leg edema. Musculoskeletal: Knees do not appear edematous; no erythema.  No apparent effusion.  Skin: Ecchymoses scattered over both upper legs left side greater than right.   Lab Results:  Lab Results  Component Value Date   WBC 41.0 (H) 04/23/2018   HGB 7.3 (L) 04/23/2018   HCT 22.8 (L) 04/23/2018   MCV 110.1 (H) 04/23/2018   PLT 51 (L) 04/23/2018   NEUTROABS 1.8 04/23/2018    Imaging:  No results found.  Medications: I have reviewed the patient's current medications.  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 Mild spinal megaly.  Ultrasound-guided biopsy of the left inguinal lymph node 09/18/2017, slightly "purulent "fluid aspirated, core biopsy is consistent with an atypical lymphoid proliferation-extensive necrosis with surrounding epithelioid histiocytes, limited intact lymphoid tissue involved with CLL  Incisional biopsy of a necrotic/purulent left inguinal lymph node on 10/01/2017-extensive necrosis with granulomatous inflammation, small amount of viable lymphoid tissue involved with CLL, AFB and fungal stains negative  Peripheral blood FISH analysis 02/05/2018- deletion 13q14, no evidence of p53 (17p13) deletion, no evidence of 11q22deletion  Bone marrow biopsy 02/26/2018-hypercellular marrow with extensive involvement by CLL, lymphocytes represent 85% of all cells  Ibrutinib initiated 04/03/2018  Ibrutinib placed on hold 04/11/2018 due to onset of arthralgias  Ibrutinib resumed 04/16/2018 2.Hypothyroidism 3.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 Augmentin prescribed. 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  She developed arthralgias after beginning ibrutinib.  The arthralgias resolved with ibrutinib was placed on hold.  She resumed ibrutinib last week and the arthralgias have recurred.  We discussed reducing the dose of ibrutinib.  She would like to continue at the current dose and take Tylenol which overall has provided fairly good relief.  She will not take more than 2 g of Tylenol in a 24-hour period.  A prescription for tramadol 50 mg every 8 hours as needed was sent to her pharmacy.  She understands she should not drive while taking tramadol.  We  reviewed the CBC  from today.  The white count is higher, hemoglobin and platelet count slightly lower.  She will return for lab and follow-up in 1 week.  She will contact the office in the interim with any problems.  We specifically discussed worsening arthralgias, increased bruising, bleeding.  Patient seen with Dr. Benay Spice.    Ned Card ANP/GNP-BC   04/23/2018  3:02 PM This was a shared visit with Ned Card.  Meagan Walsh was interviewed and examined.  She has developed arthralgias, most likely related to toxicity from ibrutinib.  We discussed discontinuing ibrutinib and changing to a lower dose.  She would like to continue ibrutinib at the current dose and use Tylenol for the arthralgias.  She will contact us if the arthralgias progress.  She has bruising over the legs, likely secondary to ibrutinib and thrombocytopenia.  She will contact us for increased bleeding or bruising.  Meagan Walsh will return for an office visit in 1 week.  [name], MD

## 2018-04-24 ENCOUNTER — Other Ambulatory Visit: Payer: Self-pay | Admitting: Oncology

## 2018-04-24 ENCOUNTER — Other Ambulatory Visit: Payer: Self-pay | Admitting: *Deleted

## 2018-04-24 DIAGNOSIS — C9110 Chronic lymphocytic leukemia of B-cell type not having achieved remission: Secondary | ICD-10-CM

## 2018-04-24 MED ORDER — IBRUTINIB 140 MG PO CAPS: 140.0000 mg | ORAL_CAPSULE | Freq: Every day | ORAL | 0 refills | Status: DC

## 2018-04-24 MED FILL — IMBRUVICA 140 MG CAPSULE: 140 | 30 days supply | Qty: 30 | Fill #0

## 2018-04-30 ENCOUNTER — Inpatient Hospital Stay: Payer: BLUE CROSS/BLUE SHIELD

## 2018-04-30 ENCOUNTER — Telehealth: Payer: Self-pay | Admitting: Oncology

## 2018-04-30 ENCOUNTER — Inpatient Hospital Stay: Payer: BLUE CROSS/BLUE SHIELD | Admitting: Oncology

## 2018-04-30 VITALS — BP 102/55 | HR 86 | Temp 98.3°F | Resp 18 | Ht 66.0 in | Wt 124.7 lb

## 2018-04-30 DIAGNOSIS — D696 Thrombocytopenia, unspecified: Secondary | ICD-10-CM | POA: Diagnosis not present

## 2018-04-30 DIAGNOSIS — B1910 Unspecified viral hepatitis B without hepatic coma: Secondary | ICD-10-CM

## 2018-04-30 DIAGNOSIS — C9110 Chronic lymphocytic leukemia of B-cell type not having achieved remission: Secondary | ICD-10-CM

## 2018-04-30 DIAGNOSIS — D801 Nonfamilial hypogammaglobulinemia: Secondary | ICD-10-CM | POA: Diagnosis not present

## 2018-04-30 DIAGNOSIS — E039 Hypothyroidism, unspecified: Secondary | ICD-10-CM | POA: Diagnosis not present

## 2018-04-30 DIAGNOSIS — Z8701 Personal history of pneumonia (recurrent): Secondary | ICD-10-CM

## 2018-04-30 DIAGNOSIS — M791 Myalgia, unspecified site: Secondary | ICD-10-CM

## 2018-04-30 DIAGNOSIS — D631 Anemia in chronic kidney disease: Secondary | ICD-10-CM

## 2018-04-30 DIAGNOSIS — C9190 Lymphoid leukemia, unspecified not having achieved remission: Secondary | ICD-10-CM | POA: Diagnosis not present

## 2018-04-30 DIAGNOSIS — Z23 Encounter for immunization: Secondary | ICD-10-CM

## 2018-04-30 LAB — CMP (CANCER CENTER ONLY)
ALT: 8 U/L (ref 0–44)
AST: 11 U/L — ABNORMAL LOW (ref 15–41)
Albumin: 4 g/dL (ref 3.5–5.0)
Alkaline Phosphatase: 53 U/L (ref 38–126)
Anion gap: 8 (ref 5–15)
BUN: 14 mg/dL (ref 6–20)
CO2: 26 mmol/L (ref 22–32)
Calcium: 9.5 mg/dL (ref 8.9–10.3)
Chloride: 106 mmol/L (ref 98–111)
Creatinine: 0.7 mg/dL (ref 0.44–1.00)
GFR, Est AFR Am: >60 mL/min (ref >60)
GFR, Estimated: >60 mL/min (ref >60)
Glucose, Bld: 116 mg/dL — ABNORMAL HIGH (ref 70–99)
Potassium: 4.5 mmol/L (ref 3.5–5.1)
Sodium: 140 mmol/L (ref 135–145)
Total Bilirubin: 0.3 mg/dL (ref 0.3–1.2)
Total Protein: 6.5 g/dL (ref 6.5–8.1)

## 2018-04-30 LAB — SAMPLE TO BLOOD BANK

## 2018-04-30 LAB — CBC WITH DIFFERENTIAL (CANCER CENTER ONLY)
Abs Immature Granulocytes: 0.03 10*3/uL (ref 0.00–0.07)
Basophils Absolute: 0.1 10*3/uL (ref 0.0–0.1)
Basophils Relative: 0 %
Eosinophils Absolute: 0 10*3/uL (ref 0.0–0.5)
Eosinophils Relative: 0 %
HCT: 22.5 % — ABNORMAL LOW (ref 36.0–46.0)
Hemoglobin: 7.1 g/dL — ABNORMAL LOW (ref 12.0–15.0)
Immature Granulocytes: 0 %
Lymphocytes Relative: 98 %
Lymphs Abs: 36.5 10*3/uL — ABNORMAL HIGH (ref 0.7–4.0)
MCH: 36.4 pg — ABNORMAL HIGH (ref 26.0–34.0)
MCHC: 31.6 g/dL (ref 30.0–36.0)
MCV: 115.4 fL — ABNORMAL HIGH (ref 80.0–100.0)
Monocytes Absolute: 0.2 10*3/uL (ref 0.1–1.0)
Monocytes Relative: 0 %
Neutro Abs: 0.8 10*3/uL — ABNORMAL LOW (ref 1.7–7.7)
Neutrophils Relative %: 2 %
Platelet Count: 94 10*3/uL — ABNORMAL LOW (ref 150–400)
RBC: 1.95 MIL/uL — ABNORMAL LOW (ref 3.87–5.11)
RDW: 22.6 % — ABNORMAL HIGH (ref 11.5–15.5)
WBC Count: 37.5 10*3/uL — ABNORMAL HIGH (ref 4.0–10.5)
nRBC: 0 % (ref 0.0–0.2)

## 2018-04-30 LAB — MAGNESIUM: Magnesium: 2.1 mg/dL (ref 1.7–2.4)

## 2018-04-30 LAB — PREPARE RBC (CROSSMATCH)

## 2018-04-30 LAB — URIC ACID: Uric Acid, Serum: 4 mg/dL (ref 2.5–7.1)

## 2018-04-30 LAB — LACTATE DEHYDROGENASE: LDH: 152 U/L (ref 98–192)

## 2018-04-30 MED ORDER — INFLUENZA VAC SPLIT QUAD 0.5 ML IM SUSY
0.5000 mL | PREFILLED_SYRINGE | Freq: Once | INTRAMUSCULAR | Status: AC
  Administered 2018-04-30: 0.5 mL via INTRAMUSCULAR

## 2018-04-30 MED ORDER — INFLUENZA VAC SPLIT QUAD 0.5 ML IM SUSY
PREFILLED_SYRINGE | INTRAMUSCULAR | Status: AC
  Filled 2018-04-30: qty 0.5

## 2018-04-30 NOTE — Telephone Encounter (Signed)
Scheduled appt per 10/8 los - pt is aware of appt date and time

## 2018-04-30 NOTE — Patient Instructions (Signed)

## 2018-04-30 NOTE — Patient Instructions (Signed)

## 2018-04-30 NOTE — Progress Notes (Signed)
[place] OFFICE PROGRESS NOTE   Diagnosis: CLL  INTERVAL HISTORY:   Meagan Walsh returns as scheduled.  She resumed ibrutinib at a dose of 420 mg daily on 04/16/2018.  She developed severe arthralgias beginning 04/24/2018.  This progressed on 04/25/2018 and she discontinued ibrutinib.  Over the weekend she developed erythema and swelling at both ankles and the left wrist.  Tylenol did not help the pain.  She obtained relief after she took 2 tramadol tablets.  The swelling and erythema has improved over the past few days. Bruises over the thighs are improving.  No other bleeding.  No fever or sweats.  She had 2 episodes of nausea and vomiting, one each on the and Sunday.  Relates the nausea to not eating.  No further nausea.  No difficulty with bowel or bladder function.  The palpable lymph nodes have improved. She is developing symptoms of anemia.  She feels her "heart beating ". Objective:  Vital signs in last 24 hours:  Blood pressure (!) 102/55, pulse 86, temperature 98.3 F (36.8 C), temperature source Oral, resp. rate 18, height _0  (1.676 m), weight 124 lb 11.2 oz (56.6 kg), SpO2 100 %.    HEENT: Thrush or ulcers Lymphatics: BB sized submental node, 0.5 similar left axillary node, 1 cm right inguinal node. Resp: Lungs clear bilaterally Cardio: Regular rate and rhythm GI: No hepatosplenomegaly Vascular: No leg edema  Skin: Resolving ecchymoses at the left thigh Musculoskeletal: Mild swelling at the left greater than right ankle with tan discoloration, mild swelling at the lateral aspect of the left wrist and fifth carpal.    Lab Results:  Lab Results  Component Value Date   WBC 37.5 (H) 04/30/2018   HGB 7.1 (L) 04/30/2018   HCT 22.5 (L) 04/30/2018   MCV 115.4 (H) 04/30/2018   PLT 94 (L) 04/30/2018   NEUTROABS 0.8 (L) 04/30/2018    CMP  Lab Results  Component Value Date   NA 140 04/30/2018   K 4.5 04/30/2018   CL 106 04/30/2018   CO2 26 04/30/2018   GLUCOSE 116 (H) 04/30/2018   BUN 14 04/30/2018   CREATININE 0.70 04/30/2018   CALCIUM 9.5 04/30/2018   PROT 6.5 04/30/2018   ALBUMIN 4.0 04/30/2018   AST 11 (L) 04/30/2018   ALT 8 04/30/2018   ALKPHOS 53 04/30/2018   BILITOT 0.3 04/30/2018   GFRNONAA >60 04/30/2018   GFRAA >60 04/30/2018    Medications: I have reviewed the patient's current medications.   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 Mild spinal megaly.  Ultrasound-guided biopsy of the left inguinal lymph node 09/18/2017, slightly "purulent "fluid aspirated, core biopsy is consistent with an atypical lymphoid proliferation-extensive necrosis with surrounding epithelioid histiocytes, limited intact lymphoid tissue involved with CLL  Incisional biopsy of a necrotic/purulent left inguinal lymph node on 10/01/2017-extensive necrosis with granulomatous inflammation, small amount of viable lymphoid tissue involved with CLL, AFB and fungal stains negative  Peripheral blood FISH analysis 02/05/2018- deletion 13q14, no evidence of p53 (17p13) deletion, no evidence of 11q22deletion  Bone marrow biopsy 02/26/2018-hypercellular marrow with extensive involvement by CLL, lymphocytes represent85% of all cells  Ibrutinib initiated 04/03/2018  Ibrutinib placed on hold 04/11/2018 due to onset of arthralgias  Ibrutinib resumed 04/16/2018, discontinued 04/25/2018 secondary to severe arthralgias/arthritis  2.Hypothyroidism 3.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 Augmentin prescribed. 6.Anemia secondary to  CLL- DAT negative, bilirubin and LDH normal June 2019, progressive symptomatic anemia 04/01/2018, red cell transfusions 04/01/2018 and 04/30/2018 7.Hypogammaglobulinemia    Disposition: [name] developed recurrent severe arthralgias/arthritis last week.  Ibrutinib has been on hold for the past 5 days.  The joint symptoms are resolving.  We discussed treatment options including switching to a different treatment.  She will resume ibrutinib at a dose of 140 mg daily beginning on 05/03/2018.  She will contact us for recurrent arthritic symptoms on this dose.  She again has symptomatic anemia.  She will receive 2 units of packed red blood cells today.  The lymphocytosis has stabilized and the platelet count is improved today.  We will check a uric acid today.  25 minutes were spent with the patient today.  The majority of the time was used for counseling and coordination of care.  Betsy Coder, MD  04/30/2018  1:24 PM

## 2018-05-01 LAB — BPAM RBC
Blood Product Expiration Date: 201911042359
Blood Product Expiration Date: 201911072359
ISSUE DATE / TIME: 201910081406
ISSUE DATE / TIME: 201910081406
Unit Type and Rh: 5100
Unit Type and Rh: 5100

## 2018-05-01 LAB — TYPE AND SCREEN
ABO/RH(D): O POS
Antibody Screen: NEGATIVE
Unit division: 0
Unit division: 0

## 2018-05-03 ENCOUNTER — Ambulatory Visit: Payer: BLUE CROSS/BLUE SHIELD | Admitting: Nurse Practitioner

## 2018-05-03 ENCOUNTER — Inpatient Hospital Stay: Payer: BLUE CROSS/BLUE SHIELD

## 2018-05-06 ENCOUNTER — Other Ambulatory Visit: Payer: Self-pay | Admitting: Psychiatry

## 2018-05-08 ENCOUNTER — Telehealth: Payer: Self-pay

## 2018-05-08 ENCOUNTER — Encounter: Payer: Self-pay | Admitting: Nurse Practitioner

## 2018-05-08 ENCOUNTER — Inpatient Hospital Stay: Payer: BLUE CROSS/BLUE SHIELD

## 2018-05-08 ENCOUNTER — Inpatient Hospital Stay: Payer: BLUE CROSS/BLUE SHIELD | Admitting: Nurse Practitioner

## 2018-05-08 VITALS — BP 101/57 | HR 77 | Temp 98.4°F | Resp 17 | Ht 66.0 in | Wt 124.4 lb

## 2018-05-08 DIAGNOSIS — M791 Myalgia, unspecified site: Secondary | ICD-10-CM

## 2018-05-08 DIAGNOSIS — Z8701 Personal history of pneumonia (recurrent): Secondary | ICD-10-CM

## 2018-05-08 DIAGNOSIS — E039 Hypothyroidism, unspecified: Secondary | ICD-10-CM

## 2018-05-08 DIAGNOSIS — D631 Anemia in chronic kidney disease: Secondary | ICD-10-CM

## 2018-05-08 DIAGNOSIS — D696 Thrombocytopenia, unspecified: Secondary | ICD-10-CM | POA: Diagnosis not present

## 2018-05-08 DIAGNOSIS — C9110 Chronic lymphocytic leukemia of B-cell type not having achieved remission: Secondary | ICD-10-CM

## 2018-05-08 DIAGNOSIS — D801 Nonfamilial hypogammaglobulinemia: Secondary | ICD-10-CM

## 2018-05-08 DIAGNOSIS — C9190 Lymphoid leukemia, unspecified not having achieved remission: Secondary | ICD-10-CM | POA: Diagnosis not present

## 2018-05-08 DIAGNOSIS — B1910 Unspecified viral hepatitis B without hepatic coma: Secondary | ICD-10-CM

## 2018-05-08 DIAGNOSIS — Z23 Encounter for immunization: Secondary | ICD-10-CM | POA: Diagnosis not present

## 2018-05-08 LAB — CBC WITH DIFFERENTIAL (CANCER CENTER ONLY)
Abs Immature Granulocytes: 0.02 10*3/uL (ref 0.00–0.07)
Basophils Absolute: 0 10*3/uL (ref 0.0–0.1)
Basophils Relative: 0 %
Eosinophils Absolute: 0 10*3/uL (ref 0.0–0.5)
Eosinophils Relative: 0 %
HCT: 34.3 % — ABNORMAL LOW (ref 36.0–46.0)
Hemoglobin: 10.7 g/dL — ABNORMAL LOW (ref 12.0–15.0)
Immature Granulocytes: 0 %
Lymphocytes Relative: 96 %
Lymphs Abs: 57.4 10*3/uL — ABNORMAL HIGH (ref 0.7–4.0)
MCH: 33 pg (ref 26.0–34.0)
MCHC: 31.2 g/dL (ref 30.0–36.0)
MCV: 105.9 fL — ABNORMAL HIGH (ref 80.0–100.0)
Monocytes Absolute: 0.4 10*3/uL (ref 0.1–1.0)
Monocytes Relative: 1 %
Neutro Abs: 2 10*3/uL (ref 1.7–7.7)
Neutrophils Relative %: 3 %
Platelet Count: 99 10*3/uL — ABNORMAL LOW (ref 150–400)
RBC: 3.24 MIL/uL — ABNORMAL LOW (ref 3.87–5.11)
RDW: 21.8 % — ABNORMAL HIGH (ref 11.5–15.5)
WBC Count: 59.8 10*3/uL (ref 4.0–10.5)
nRBC: 0 % (ref 0.0–0.2)

## 2018-05-08 LAB — BASIC METABOLIC PANEL - CANCER CENTER ONLY
Anion gap: 9 (ref 5–15)
BUN: 19 mg/dL (ref 6–20)
CO2: 28 mmol/L (ref 22–32)
Calcium: 10.1 mg/dL (ref 8.9–10.3)
Chloride: 106 mmol/L (ref 98–111)
Creatinine: 0.83 mg/dL (ref 0.44–1.00)
GFR, Est AFR Am: >60 mL/min (ref >60)
GFR, Estimated: >60 mL/min (ref >60)
Glucose, Bld: 102 mg/dL — ABNORMAL HIGH (ref 70–99)
Potassium: 4.1 mmol/L (ref 3.5–5.1)
Sodium: 143 mmol/L (ref 135–145)

## 2018-05-08 LAB — URIC ACID: Uric Acid, Serum: 5.5 mg/dL (ref 2.5–7.1)

## 2018-05-08 NOTE — Progress Notes (Addendum)
[place] OFFICE PROGRESS NOTE   Diagnosis: CLL  INTERVAL HISTORY:   Meagan Walsh returns as scheduled.  She was transfused 2 units of blood on 04/30/2018.  She feels much better since the blood transfusion.  She denies any bleeding.  She has good appetite.  No fevers, sweats or chills.  No shortness of breath.  Previous bruises are resolving.  No new bruises.  Yesterday she noted knee pain.  She took 1 extra strength Tylenol.  She has mild knee pain today and took a dose of Tylenol with fairly good relief.  Objective:  Vital signs in last 24 hours:  Blood pressure (!) 101/57, pulse 77, temperature 98.4 F (36.9 C), temperature source Oral, resp. rate 17, height '5\' 6"'  (1.676 m), weight 124 lb 6.4 oz (56.4 kg), SpO2 100 %.    HEENT: No thrush or ulcers. Lymphatics: No palpable cervical, supraclavicular, axillary lymph nodes.  1/2 to 1 cm medial right inguinal lymph node. Resp: Lungs clear bilaterally. Cardio: Regular rate and rhythm. GI: Abdomen soft and nontender.  No hepatosplenomegaly. Vascular: No leg edema. Musculoskeletal: No significant edema at the left ankle, skin with mild discoloration. Skin: Resolving ecchymoses bilateral thighs.   Lab Results:  Lab Results  Component Value Date   WBC PENDING 05/08/2018   HGB 10.7 (L) 05/08/2018   HCT 34.3 (L) 05/08/2018   MCV 105.9 (H) 05/08/2018   PLT 99 (L) 05/08/2018   NEUTROABS PENDING 05/08/2018    Imaging:  No results found.  Medications: I have reviewed the patient's current medications.  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 Mild spinal megaly.  Ultrasound-guided biopsy of the left inguinal  lymph node 09/18/2017, slightly "purulent "fluid aspirated, core biopsy is consistent with an atypical lymphoid proliferation-extensive necrosis with surrounding epithelioid histiocytes, limited intact lymphoid tissue involved with CLL  Incisional biopsy of a necrotic/purulent left inguinal lymph node on 10/01/2017-extensive necrosis with granulomatous inflammation, small amount of viable lymphoid tissue involved with CLL, AFB and fungal stains negative  Peripheral blood FISH analysis 02/05/2018- deletion 13q14, no evidence of p53 (17p13) deletion, no evidence of 11q22deletion  Bone marrow biopsy 02/26/2018-hypercellular marrow with extensive involvement by CLL, lymphocytes represent85% of all cells  Ibrutinib initiated 04/03/2018  Ibrutinib placed on hold 04/11/2018 due to onset of arthralgias  Ibrutinib resumed 04/16/2018, discontinued 04/25/2018 secondary to severe arthralgias/arthritis  Ibrutinib resumed at a dose of 140 mg daily 05/03/2018 2.Hypothyroidism 3.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 Augmentin prescribed. 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  She notes improved tolerance of ibrutinib at the reduced dose.  She will continue the same.  We reviewed the CBC from today.  The hemoglobin is better.  Platelet count stable.  She will return for lab and follow-up in 2 weeks.  She will contact the office in the interim with any problems.  Patient seen with Dr. Benay Spice.    Meagan Walsh ANP/GNP-BC   05/08/2018  12:03 PM This was a shared visit with Meagan Walsh.  Meagan Walsh appears to be tolerating the reduced dose ibrutinib with diminished arthralgias.  She will  continue ibrutinib at the current dose.  Meagan Manson, MD

## 2018-05-08 NOTE — Telephone Encounter (Signed)
Printed avs and calender of upcoming appointment. Per 10/16 los 

## 2018-05-13 DIAGNOSIS — E039 Hypothyroidism, unspecified: Secondary | ICD-10-CM | POA: Diagnosis not present

## 2018-05-13 DIAGNOSIS — I341 Nonrheumatic mitral (valve) prolapse: Secondary | ICD-10-CM | POA: Diagnosis not present

## 2018-05-13 DIAGNOSIS — C9110 Chronic lymphocytic leukemia of B-cell type not having achieved remission: Secondary | ICD-10-CM | POA: Diagnosis not present

## 2018-05-13 DIAGNOSIS — F325 Major depressive disorder, single episode, in full remission: Secondary | ICD-10-CM | POA: Diagnosis not present

## 2018-05-22 ENCOUNTER — Other Ambulatory Visit: Payer: BLUE CROSS/BLUE SHIELD

## 2018-05-22 ENCOUNTER — Ambulatory Visit: Payer: BLUE CROSS/BLUE SHIELD | Admitting: Oncology

## 2018-05-23 ENCOUNTER — Other Ambulatory Visit: Payer: Self-pay | Admitting: Nurse Practitioner

## 2018-05-23 DIAGNOSIS — C9110 Chronic lymphocytic leukemia of B-cell type not having achieved remission: Secondary | ICD-10-CM

## 2018-05-28 ENCOUNTER — Inpatient Hospital Stay (HOSPITAL_BASED_OUTPATIENT_CLINIC_OR_DEPARTMENT_OTHER): Payer: BLUE CROSS/BLUE SHIELD | Admitting: Oncology

## 2018-05-28 ENCOUNTER — Inpatient Hospital Stay: Payer: BLUE CROSS/BLUE SHIELD | Attending: Oncology

## 2018-05-28 ENCOUNTER — Inpatient Hospital Stay: Payer: BLUE CROSS/BLUE SHIELD

## 2018-05-28 ENCOUNTER — Encounter: Payer: Self-pay | Admitting: Oncology

## 2018-05-28 ENCOUNTER — Telehealth: Payer: Self-pay | Admitting: Oncology

## 2018-05-28 VITALS — BP 104/58 | HR 85 | Temp 98.5°F | Resp 14 | Ht 66.0 in | Wt 126.6 lb

## 2018-05-28 DIAGNOSIS — R002 Palpitations: Secondary | ICD-10-CM | POA: Diagnosis not present

## 2018-05-28 DIAGNOSIS — Z23 Encounter for immunization: Secondary | ICD-10-CM | POA: Diagnosis not present

## 2018-05-28 DIAGNOSIS — D649 Anemia, unspecified: Secondary | ICD-10-CM

## 2018-05-28 DIAGNOSIS — C9190 Lymphoid leukemia, unspecified not having achieved remission: Secondary | ICD-10-CM | POA: Diagnosis not present

## 2018-05-28 DIAGNOSIS — C9110 Chronic lymphocytic leukemia of B-cell type not having achieved remission: Secondary | ICD-10-CM

## 2018-05-28 DIAGNOSIS — Z8701 Personal history of pneumonia (recurrent): Secondary | ICD-10-CM

## 2018-05-28 DIAGNOSIS — E039 Hypothyroidism, unspecified: Secondary | ICD-10-CM

## 2018-05-28 DIAGNOSIS — D801 Nonfamilial hypogammaglobulinemia: Secondary | ICD-10-CM | POA: Insufficient documentation

## 2018-05-28 LAB — CBC WITH DIFFERENTIAL (CANCER CENTER ONLY)
Abs Immature Granulocytes: 0.07 10*3/uL (ref 0.00–0.07)
Basophils Absolute: 0 10*3/uL (ref 0.0–0.1)
Basophils Relative: 0 %
Eosinophils Absolute: 0 10*3/uL (ref 0.0–0.5)
Eosinophils Relative: 0 %
HCT: 22.2 % — ABNORMAL LOW (ref 36.0–46.0)
Hemoglobin: 6.6 g/dL — CL (ref 12.0–15.0)
Immature Granulocytes: 0 %
Lymphocytes Relative: 94 %
Lymphs Abs: 65.2 10*3/uL — ABNORMAL HIGH (ref 0.7–4.0)
MCH: 32.5 pg (ref 26.0–34.0)
MCHC: 29.7 g/dL — ABNORMAL LOW (ref 30.0–36.0)
MCV: 109.4 fL — ABNORMAL HIGH (ref 80.0–100.0)
Monocytes Absolute: 0.3 10*3/uL (ref 0.1–1.0)
Monocytes Relative: 1 %
Neutro Abs: 3.2 10*3/uL (ref 1.7–7.7)
Neutrophils Relative %: 5 %
Platelet Count: 113 10*3/uL — ABNORMAL LOW (ref 150–400)
RBC: 2.03 MIL/uL — ABNORMAL LOW (ref 3.87–5.11)
RDW: 21.1 % — ABNORMAL HIGH (ref 11.5–15.5)
WBC Count: 68.8 10*3/uL (ref 4.0–10.5)
nRBC: 0 % (ref 0.0–0.2)

## 2018-05-28 LAB — CMP (CANCER CENTER ONLY)
ALT: 13 U/L (ref 0–44)
AST: 12 U/L — ABNORMAL LOW (ref 15–41)
Albumin: 3.7 g/dL (ref 3.5–5.0)
Alkaline Phosphatase: 74 U/L (ref 38–126)
Anion gap: 9 (ref 5–15)
BUN: 16 mg/dL (ref 6–20)
CO2: 28 mmol/L (ref 22–32)
Calcium: 9.4 mg/dL (ref 8.9–10.3)
Chloride: 109 mmol/L (ref 98–111)
Creatinine: 0.75 mg/dL (ref 0.44–1.00)
GFR, Est AFR Am: >60 mL/min (ref >60)
GFR, Estimated: >60 mL/min (ref >60)
Glucose, Bld: 91 mg/dL (ref 70–99)
Potassium: 4.3 mmol/L (ref 3.5–5.1)
Sodium: 146 mmol/L — ABNORMAL HIGH (ref 135–145)
Total Bilirubin: 0.3 mg/dL (ref 0.3–1.2)
Total Protein: 5.7 g/dL — ABNORMAL LOW (ref 6.5–8.1)

## 2018-05-28 LAB — URIC ACID: Uric Acid, Serum: 6 mg/dL (ref 2.5–7.1)

## 2018-05-28 LAB — PREPARE RBC (CROSSMATCH)

## 2018-05-28 MED ORDER — SODIUM CHLORIDE 0.9% IV SOLUTION
250.0000 mL | Freq: Once | INTRAVENOUS | Status: AC
  Administered 2018-05-28: 250 mL via INTRAVENOUS
  Filled 2018-05-28: qty 250

## 2018-05-28 MED ORDER — IBRUTINIB 140 MG PO CAPS: 140.0000 mg | ORAL_CAPSULE | Freq: Every day | ORAL | 0 refills | Status: DC

## 2018-05-28 MED ORDER — PNEUMOCOCCAL VAC POLYVALENT 25 MCG/0.5ML IJ INJ
0.5000 mL | INJECTION | Freq: Once | INTRAMUSCULAR | Status: AC
  Administered 2018-05-28: 0.5 mL via INTRAMUSCULAR
  Filled 2018-05-28: qty 0.5

## 2018-05-28 NOTE — Telephone Encounter (Signed)
Appts scheduled avs/calendar declined due to my chart per 11/5 los

## 2018-05-28 NOTE — Progress Notes (Signed)
[place] OFFICE PROGRESS NOTE   Diagnosis: CLL  INTERVAL HISTORY:   Ms. Neiss returns for a scheduled visit.  She is taking ibrutinib at a dose of 140 mg daily.  The arthralgias have resolved.  She has noted a decrease in the right inguinal lymph node.  No other palpable lymph nodes.  No fever or night sweats.  She feels well.  She has noted her heart "pounding "over the past several days.  She feels that she may need another transfusion.  No dyspnea.  Objective:  Vital signs in last 24 hours:  Blood pressure (!) 104/58, pulse 85, temperature 98.5 F (36.9 C), temperature source Oral, resp. rate 14, height '5\' 6"'  (1.676 m), weight 126 lb 9.6 oz (57.4 kg), SpO2 100 %.    HEENT: Neck without mass Lymphatics: No cervical, supraclavicular, submental, or axillary nodes.  1/2 cm medial right inguinal node. Resp: Lungs clear bilaterally Cardio: Regular rate and rhythm GI: No hepatomegaly, spleen tip?  Below the left costal margin Vascular: No leg edema  Skin: Small ecchymosis at the left forearm   Lab Results:  Lab Results  Component Value Date   WBC 68.8 (HH) 05/28/2018   HGB 6.6 (LL) 05/28/2018   HCT 22.2 (L) 05/28/2018   MCV 109.4 (H) 05/28/2018   PLT 113 (L) 05/28/2018   NEUTROABS PENDING 05/28/2018    CMP  Lab Results  Component Value Date   NA 143 05/08/2018   K 4.1 05/08/2018   CL 106 05/08/2018   CO2 28 05/08/2018   GLUCOSE 102 (H) 05/08/2018   BUN 19 05/08/2018   CREATININE 0.83 05/08/2018   CALCIUM 10.1 05/08/2018   PROT 6.5 04/30/2018   ALBUMIN 4.0 04/30/2018   AST 11 (L) 04/30/2018   ALT 8 04/30/2018   ALKPHOS 53 04/30/2018   BILITOT 0.3 04/30/2018   GFRNONAA >60 05/08/2018   GFRAA >60 05/08/2018    Medications: I have reviewed the patient's current medications.   Assessment/Plan: 1.CLL-diagnosed in August 2010, flow cytometry consistent with CLL  Enlarged leftinguinal lymph node January 2019,smallneck/axillary nodes and  palpable splenomegaly 09/11/2017  CTson 09/17/2017-3 cm necrotic appearing lymph node in the left inguinal region, borderline enlarged pelvic/retroperitoneal, chest, and axillary nodes. Mild spinal megaly.  Ultrasound-guided biopsy of the left inguinal lymph node 09/18/2017, slightly "purulent "fluid aspirated, core biopsy is consistent with an atypical lymphoid proliferation-extensive necrosis with surrounding epithelioid histiocytes, limited intact lymphoid tissue involved with CLL  Incisional biopsy of a necrotic/purulent left inguinal lymph node on 10/01/2017-extensive necrosis with granulomatous inflammation, small amount of viable lymphoid tissue involved with CLL, AFB and fungal stains negative  Peripheral blood FISH analysis 02/05/2018- deletion 13q14, no evidence of p53 (17p13) deletion, no evidence of 11q22deletion  Bone marrow biopsy 02/26/2018-hypercellular marrow with extensive involvement by CLL, lymphocytes represent85% of all cells  Ibrutinib initiated 04/03/2018  Ibrutinib placed on hold 04/11/2018 due to onset of arthralgias  Ibrutinib resumed 04/16/2018, discontinued 04/25/2018 secondary to severe arthralgias/arthritis  Ibrutinib resumed at a dose of 140 mg daily 05/03/2018 2.Hypothyroidism 3.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 Augmentin prescribed. 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 Llamas appears to be tolerating the ibrutinib well.  She no longer has arthralgias.  The palpable lymphadenopathy is improved and the platelet count is rising.  The lymphocytosis is likely related to ibrutinib.  She has persistent severe anemia.  Hopefully this will improve over  the next 1-2 months.  She will receive a red cell transfusion today.  She will contact us for symptoms of anemia.  She will return for an office and lab visit in 2 weeks.  She will be scheduled for a 1 month office visit.  We will consider escalating the ibrutinib to 280 mg daily when she returns for the lab visit in 2 weeks.  She will receive a 23 valent pneumococcal vaccine today.  25 minutes were spent with the patient today.  The majority of the time was used for counseling and coordination of care.  Betsy Coder, MD  05/28/2018  8:19 AM

## 2018-05-29 LAB — TYPE AND SCREEN
ABO/RH(D): O POS
Antibody Screen: NEGATIVE
Unit division: 0
Unit division: 0

## 2018-05-29 LAB — BPAM RBC
Blood Product Expiration Date: 201912032359
Blood Product Expiration Date: 201912032359
ISSUE DATE / TIME: 201911051004
ISSUE DATE / TIME: 201911051004
Unit Type and Rh: 5100
Unit Type and Rh: 5100

## 2018-05-29 MED FILL — IMBRUVICA 140 MG CAPSULE: 140 | 30 days supply | Qty: 30 | Fill #0

## 2018-05-31 ENCOUNTER — Ambulatory Visit: Payer: BLUE CROSS/BLUE SHIELD | Admitting: Oncology

## 2018-05-31 ENCOUNTER — Other Ambulatory Visit: Payer: BLUE CROSS/BLUE SHIELD

## 2018-06-11 ENCOUNTER — Telehealth: Payer: Self-pay | Admitting: *Deleted

## 2018-06-11 NOTE — Telephone Encounter (Signed)
Called to ask for labs sooner than 06/14/18-feeling short of breath like she needs blood again. Scheduling message sent high priority.

## 2018-06-12 ENCOUNTER — Inpatient Hospital Stay: Payer: BLUE CROSS/BLUE SHIELD

## 2018-06-12 ENCOUNTER — Telehealth: Payer: Self-pay | Admitting: *Deleted

## 2018-06-12 DIAGNOSIS — E039 Hypothyroidism, unspecified: Secondary | ICD-10-CM | POA: Diagnosis not present

## 2018-06-12 DIAGNOSIS — C9110 Chronic lymphocytic leukemia of B-cell type not having achieved remission: Secondary | ICD-10-CM

## 2018-06-12 DIAGNOSIS — Z23 Encounter for immunization: Secondary | ICD-10-CM | POA: Diagnosis not present

## 2018-06-12 DIAGNOSIS — R002 Palpitations: Secondary | ICD-10-CM | POA: Diagnosis not present

## 2018-06-12 DIAGNOSIS — D649 Anemia, unspecified: Secondary | ICD-10-CM | POA: Diagnosis not present

## 2018-06-12 DIAGNOSIS — C9190 Lymphoid leukemia, unspecified not having achieved remission: Secondary | ICD-10-CM | POA: Diagnosis not present

## 2018-06-12 DIAGNOSIS — Z8701 Personal history of pneumonia (recurrent): Secondary | ICD-10-CM | POA: Diagnosis not present

## 2018-06-12 DIAGNOSIS — D801 Nonfamilial hypogammaglobulinemia: Secondary | ICD-10-CM | POA: Diagnosis not present

## 2018-06-12 LAB — CBC WITH DIFFERENTIAL (CANCER CENTER ONLY)
Abs Immature Granulocytes: 0.08 10*3/uL — ABNORMAL HIGH (ref 0.00–0.07)
Basophils Absolute: 0 10*3/uL (ref 0.0–0.1)
Basophils Relative: 0 %
Eosinophils Absolute: 0 10*3/uL (ref 0.0–0.5)
Eosinophils Relative: 0 %
HCT: 24.2 % — ABNORMAL LOW (ref 36.0–46.0)
Hemoglobin: 7.4 g/dL — ABNORMAL LOW (ref 12.0–15.0)
Immature Granulocytes: 0 %
Lymphocytes Relative: 92 %
Lymphs Abs: 62.1 10*3/uL — ABNORMAL HIGH (ref 0.7–4.0)
MCH: 30.1 pg (ref 26.0–34.0)
MCHC: 30.6 g/dL (ref 30.0–36.0)
MCV: 98.4 fL (ref 80.0–100.0)
Monocytes Absolute: 0.4 10*3/uL (ref 0.1–1.0)
Monocytes Relative: 1 %
Neutro Abs: 4.3 10*3/uL (ref 1.7–7.7)
Neutrophils Relative %: 7 %
Platelet Count: 153 10*3/uL (ref 150–400)
RBC: 2.46 MIL/uL — ABNORMAL LOW (ref 3.87–5.11)
RDW: 21.3 % — ABNORMAL HIGH (ref 11.5–15.5)
WBC Count: 66.9 10*3/uL (ref 4.0–10.5)
nRBC: 0 % (ref 0.0–0.2)

## 2018-06-12 LAB — SAMPLE TO BLOOD BANK

## 2018-06-12 NOTE — Telephone Encounter (Signed)
Called patient with her CBC results. She declined transfusion due to adult children coming in this week and weekend. Prefers to wait till next week. Scheduled for lab on 06/17/18 and requested transfusion appointment via scheduling message for same day.

## 2018-06-14 ENCOUNTER — Telehealth: Payer: Self-pay | Admitting: Psychiatry

## 2018-06-14 ENCOUNTER — Other Ambulatory Visit: Payer: BLUE CROSS/BLUE SHIELD

## 2018-06-14 ENCOUNTER — Other Ambulatory Visit: Payer: Self-pay | Admitting: Psychiatry

## 2018-06-14 MED ORDER — LAMOTRIGINE 200 MG PO TABS: 200.0000 mg | ORAL_TABLET | Freq: Two times a day (BID) | ORAL | 1 refills | Status: DC

## 2018-06-14 NOTE — Progress Notes (Signed)
Lamotrigine refill.

## 2018-06-14 NOTE — Telephone Encounter (Signed)
Need refill of Lamotrigine to [facility].  Requested 2 days ago.  I am to take 2 q d and am now out.

## 2018-06-17 ENCOUNTER — Inpatient Hospital Stay: Payer: BLUE CROSS/BLUE SHIELD

## 2018-06-17 ENCOUNTER — Other Ambulatory Visit: Payer: Self-pay | Admitting: *Deleted

## 2018-06-17 DIAGNOSIS — C9110 Chronic lymphocytic leukemia of B-cell type not having achieved remission: Secondary | ICD-10-CM

## 2018-06-17 DIAGNOSIS — D801 Nonfamilial hypogammaglobulinemia: Secondary | ICD-10-CM | POA: Diagnosis not present

## 2018-06-17 DIAGNOSIS — E039 Hypothyroidism, unspecified: Secondary | ICD-10-CM | POA: Diagnosis not present

## 2018-06-17 DIAGNOSIS — Z8701 Personal history of pneumonia (recurrent): Secondary | ICD-10-CM | POA: Diagnosis not present

## 2018-06-17 DIAGNOSIS — D649 Anemia, unspecified: Secondary | ICD-10-CM | POA: Diagnosis not present

## 2018-06-17 DIAGNOSIS — C9190 Lymphoid leukemia, unspecified not having achieved remission: Secondary | ICD-10-CM | POA: Diagnosis not present

## 2018-06-17 DIAGNOSIS — R002 Palpitations: Secondary | ICD-10-CM | POA: Diagnosis not present

## 2018-06-17 DIAGNOSIS — Z23 Encounter for immunization: Secondary | ICD-10-CM | POA: Diagnosis not present

## 2018-06-17 LAB — CBC WITH DIFFERENTIAL (CANCER CENTER ONLY)
Abs Immature Granulocytes: 0.11 10*3/uL — ABNORMAL HIGH (ref 0.00–0.07)
Basophils Absolute: 0 10*3/uL (ref 0.0–0.1)
Basophils Relative: 0 %
Eosinophils Absolute: 0 10*3/uL (ref 0.0–0.5)
Eosinophils Relative: 0 %
HCT: 23.9 % — ABNORMAL LOW (ref 36.0–46.0)
Hemoglobin: 7.2 g/dL — ABNORMAL LOW (ref 12.0–15.0)
Immature Granulocytes: 0 %
Lymphocytes Relative: 95 %
Lymphs Abs: 78 10*3/uL — ABNORMAL HIGH (ref 0.7–4.0)
MCH: 29.3 pg (ref 26.0–34.0)
MCHC: 30.1 g/dL (ref 30.0–36.0)
MCV: 97.2 fL (ref 80.0–100.0)
Monocytes Absolute: 0.3 10*3/uL (ref 0.1–1.0)
Monocytes Relative: 0 %
Neutro Abs: 4.2 10*3/uL (ref 1.7–7.7)
Neutrophils Relative %: 5 %
Platelet Count: 154 10*3/uL (ref 150–400)
RBC: 2.46 MIL/uL — ABNORMAL LOW (ref 3.87–5.11)
RDW: 21 % — ABNORMAL HIGH (ref 11.5–15.5)
WBC Count: 82.6 10*3/uL (ref 4.0–10.5)
nRBC: 0 % (ref 0.0–0.2)

## 2018-06-17 LAB — SAMPLE TO BLOOD BANK

## 2018-06-17 LAB — PREPARE RBC (CROSSMATCH)

## 2018-06-17 MED ORDER — PALONOSETRON HCL INJECTION 0.25 MG/5ML
INTRAVENOUS | Status: AC
  Filled 2018-06-17: qty 5

## 2018-06-17 MED ORDER — SODIUM CHLORIDE 0.9% IV SOLUTION
250.0000 mL | Freq: Once | INTRAVENOUS | Status: AC
  Administered 2018-06-17: 250 mL via INTRAVENOUS
  Filled 2018-06-17: qty 250

## 2018-06-17 MED ORDER — DEXAMETHASONE SODIUM PHOSPHATE 10 MG/ML IJ SOLN
INTRAMUSCULAR | Status: AC
  Filled 2018-06-17: qty 1

## 2018-06-17 NOTE — Progress Notes (Signed)
Notified patient increase in WBC count. Per Dr. Benay Spice, this is common in the first few months of treatment with Iburutinib. Will check counts again on 06/25/18 visit.

## 2018-06-17 NOTE — Progress Notes (Signed)
Meagan Walsh (MD Sherrill's primary nurse) aware of panic level WBC count

## 2018-06-17 NOTE — Patient Instructions (Signed)

## 2018-06-18 LAB — TYPE AND SCREEN
ABO/RH(D): O POS
Antibody Screen: NEGATIVE
Unit division: 0
Unit division: 0

## 2018-06-18 LAB — BPAM RBC
Blood Product Expiration Date: 201912202359
Blood Product Expiration Date: 201912232359
ISSUE DATE / TIME: 201911251231
ISSUE DATE / TIME: 201911251231
Unit Type and Rh: 5100
Unit Type and Rh: 5100

## 2018-06-25 ENCOUNTER — Inpatient Hospital Stay: Payer: BLUE CROSS/BLUE SHIELD

## 2018-06-25 ENCOUNTER — Telehealth: Payer: Self-pay

## 2018-06-25 ENCOUNTER — Encounter: Payer: Self-pay | Admitting: Nurse Practitioner

## 2018-06-25 ENCOUNTER — Inpatient Hospital Stay: Payer: BLUE CROSS/BLUE SHIELD | Attending: Oncology | Admitting: Nurse Practitioner

## 2018-06-25 VITALS — BP 106/65 | HR 82 | Temp 98.4°F | Resp 17 | Ht 66.0 in | Wt 128.9 lb

## 2018-06-25 DIAGNOSIS — Z8701 Personal history of pneumonia (recurrent): Secondary | ICD-10-CM | POA: Insufficient documentation

## 2018-06-25 DIAGNOSIS — Z79899 Other long term (current) drug therapy: Secondary | ICD-10-CM | POA: Insufficient documentation

## 2018-06-25 DIAGNOSIS — D630 Anemia in neoplastic disease: Secondary | ICD-10-CM | POA: Diagnosis not present

## 2018-06-25 DIAGNOSIS — C9110 Chronic lymphocytic leukemia of B-cell type not having achieved remission: Secondary | ICD-10-CM

## 2018-06-25 DIAGNOSIS — E039 Hypothyroidism, unspecified: Secondary | ICD-10-CM | POA: Insufficient documentation

## 2018-06-25 DIAGNOSIS — B1910 Unspecified viral hepatitis B without hepatic coma: Secondary | ICD-10-CM | POA: Insufficient documentation

## 2018-06-25 DIAGNOSIS — C9190 Lymphoid leukemia, unspecified not having achieved remission: Secondary | ICD-10-CM | POA: Diagnosis not present

## 2018-06-25 DIAGNOSIS — J3489 Other specified disorders of nose and nasal sinuses: Secondary | ICD-10-CM | POA: Insufficient documentation

## 2018-06-25 DIAGNOSIS — D801 Nonfamilial hypogammaglobulinemia: Secondary | ICD-10-CM | POA: Diagnosis not present

## 2018-06-25 DIAGNOSIS — M25511 Pain in right shoulder: Secondary | ICD-10-CM | POA: Insufficient documentation

## 2018-06-25 LAB — CBC WITH DIFFERENTIAL (CANCER CENTER ONLY)
Abs Immature Granulocytes: 0.07 10*3/uL (ref 0.00–0.07)
Basophils Absolute: 0 10*3/uL (ref 0.0–0.1)
Basophils Relative: 0 %
Eosinophils Absolute: 0 10*3/uL (ref 0.0–0.5)
Eosinophils Relative: 0 %
HCT: 29.2 % — ABNORMAL LOW (ref 36.0–46.0)
Hemoglobin: 8.8 g/dL — ABNORMAL LOW (ref 12.0–15.0)
Immature Granulocytes: 0 %
Lymphocytes Relative: 95 %
Lymphs Abs: 74 10*3/uL — ABNORMAL HIGH (ref 0.7–4.0)
MCH: 28.6 pg (ref 26.0–34.0)
MCHC: 30.1 g/dL (ref 30.0–36.0)
MCV: 94.8 fL (ref 80.0–100.0)
Monocytes Absolute: 0.3 10*3/uL (ref 0.1–1.0)
Monocytes Relative: 0 %
Neutro Abs: 3.7 10*3/uL (ref 1.7–7.7)
Neutrophils Relative %: 5 %
Platelet Count: 116 10*3/uL — ABNORMAL LOW (ref 150–400)
RBC: 3.08 MIL/uL — ABNORMAL LOW (ref 3.87–5.11)
RDW: 18.2 % — ABNORMAL HIGH (ref 11.5–15.5)
WBC Count: 78.1 10*3/uL (ref 4.0–10.5)
nRBC: 0 % (ref 0.0–0.2)

## 2018-06-25 LAB — CMP (CANCER CENTER ONLY)
ALT: 13 U/L (ref 0–44)
AST: 12 U/L — ABNORMAL LOW (ref 15–41)
Albumin: 3.9 g/dL (ref 3.5–5.0)
Alkaline Phosphatase: 78 U/L (ref 38–126)
Anion gap: 7 (ref 5–15)
BUN: 20 mg/dL (ref 6–20)
CO2: 28 mmol/L (ref 22–32)
Calcium: 9.7 mg/dL (ref 8.9–10.3)
Chloride: 107 mmol/L (ref 98–111)
Creatinine: 0.83 mg/dL (ref 0.44–1.00)
GFR, Est AFR Am: >60 mL/min (ref >60)
GFR, Estimated: >60 mL/min (ref >60)
Glucose, Bld: 97 mg/dL (ref 70–99)
Potassium: 4.4 mmol/L (ref 3.5–5.1)
Sodium: 142 mmol/L (ref 135–145)
Total Bilirubin: 0.4 mg/dL (ref 0.3–1.2)
Total Protein: 5.8 g/dL — ABNORMAL LOW (ref 6.5–8.1)

## 2018-06-25 LAB — RETICULOCYTES
Immature Retic Fract: 1.6 % — ABNORMAL LOW (ref 2.3–15.9)
RBC.: 3.08 MIL/uL — ABNORMAL LOW (ref 3.87–5.11)
Retic Count, Absolute: 4.6 10*3/uL — ABNORMAL LOW (ref 19.0–186.0)
Retic Ct Pct: 0.2 % — ABNORMAL LOW (ref 0.4–3.1)

## 2018-06-25 LAB — SAMPLE TO BLOOD BANK

## 2018-06-25 MED ORDER — IBRUTINIB 140 MG PO CAPS: ORAL_CAPSULE | ORAL | 0 refills | Status: DC

## 2018-06-25 NOTE — Progress Notes (Addendum)
[place] OFFICE PROGRESS NOTE   Diagnosis: CLL  INTERVAL HISTORY:   Ms. Wolfman returns as scheduled.  She continues ibrutinib.  She felt much better after the recent blood transfusion.  Specifically shortness of breath resolved.  She has noted improvement in joints and skin over the past 5 to 7 days.  She notes intermittent discomfort over the soles of her feet.  No nausea or vomiting.  No diarrhea.  No bruising.  Objective:  Vital signs in last 24 hours:  Blood pressure 106/65, pulse 82, temperature 98.4 F (36.9 C), temperature source Oral, resp. rate 17, height _0  (1.676 m), weight 128 lb 14.4 oz (58.5 kg), SpO2 100 %.    HEENT: No thrush or ulcers. Lymphatics: No palpable cervical, supraclavicular or axillary lymph nodes.  Approximate half centimeter medial right inguinal lymph node. Resp: Lungs clear bilaterally. Cardio: Regular rate and rhythm. GI: Abdomen soft and nontender.  No hepatosplenomegaly. Vascular: No leg edema. Skin: Area of mild erythema skin overlying the left wrist joint.   Lab Results:  Lab Results  Component Value Date   WBC 78.1 (HH) 06/25/2018   HGB 8.8 (L) 06/25/2018   HCT 29.2 (L) 06/25/2018   MCV 94.8 06/25/2018   PLT 116 (L) 06/25/2018   NEUTROABS 3.7 06/25/2018    Imaging:  No results found.  Medications: I have reviewed the patient's current medications.  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 Mild spinal megaly.  Ultrasound-guided biopsy of the left inguinal lymph node 09/18/2017, slightly "purulent "fluid aspirated, core biopsy is consistent with an atypical lymphoid proliferation-extensive necrosis with  surrounding epithelioid histiocytes, limited intact lymphoid tissue involved with CLL  Incisional biopsy of a necrotic/purulent left inguinal lymph node on 10/01/2017-extensive necrosis with granulomatous inflammation, small amount of viable lymphoid tissue involved with CLL, AFB and fungal stains negative  Peripheral blood FISH analysis 02/05/2018- deletion 13q14, no evidence of p53 (17p13) deletion, no evidence of 11q22deletion  Bone marrow biopsy 02/26/2018-hypercellular marrow with extensive involvement by CLL, lymphocytes represent85% of all cells  Ibrutinib initiated 04/03/2018  Ibrutinib placed on hold 04/11/2018 due to onset of arthralgias  Ibrutinib resumed 04/16/2018, discontinued 04/25/2018 secondary to severe arthralgias/arthritis  Ibrutinib resumed at a dose of 140 mg daily 05/03/2018  Ibrutinib dose adjusted to 140 mg alternating with 280 mg 06/25/2018 2.Hypothyroidism 3.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 Augmentin prescribed. 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 Balthazar appears stable.  She is currently on ibrutinib 140 mg daily and over the past week has noted improved tolerance in terms of joint pain and skin rash.  We reviewed the CBC from today.  She has a persistent lymphocytosis which is not uncommon in patients on ibrutinib.  The neutrophil count remains in normal range.  Hemoglobin today is better but she continues to require periodic transfusion support.  Dr. Benay Spice recommends adjustment of the ibrutinib dose from 1 tablet daily to 1 tablet alternating with 2 tablets.  She will contact the office if the joint pain or skin rash recurs.  She will return for  lab and  follow-up in approximately 2 weeks.  She will contact the office in the interim as outlined above or with any other problems.  Patient seen with Dr. Benay Spice.    Ned Card ANP/GNP-BC   06/25/2018  10:00 AM  This was a shared visit with Ned Card.  Ms. Hakanson is tolerating the reduced dose ibrutinib well.  There is evidence of a clinical response based on the improved neutropenia/thrombocytopenia and lymphadenopathy.  She has gained weight over the past several months.  The lymphocytosis is likely an expected manifestation of ibrutinib therapy.  She will try to escalate the ibrutinib to 140 mg alternating with 280 mg daily.  She will contact us if she develops recurrent arthralgias on this regimen.  She will return for an office visit and CBC in 2 weeks.  [name], MD

## 2018-06-25 NOTE — Telephone Encounter (Signed)
Printed avs and calender of upcoming appointment. Per 12/3 los 

## 2018-06-27 MED FILL — IMBRUVICA 140 MG CAPSULE: 140 | 30 days supply | Qty: 45 | Fill #0

## 2018-07-02 ENCOUNTER — Other Ambulatory Visit: Payer: Self-pay | Admitting: Nurse Practitioner

## 2018-07-02 ENCOUNTER — Telehealth: Payer: Self-pay | Admitting: *Deleted

## 2018-07-02 DIAGNOSIS — C9110 Chronic lymphocytic leukemia of B-cell type not having achieved remission: Secondary | ICD-10-CM

## 2018-07-02 NOTE — Telephone Encounter (Signed)
Wanted MD aware that she is having bone pain in knees and wrists. Tends to rotate to different places. Is tolerable at this time with acetaminophen and occasional tramadol. She will try to manage until her appointment on 12/18, but will call if it worsens. Message forwarded to MD.

## 2018-07-03 ENCOUNTER — Telehealth: Payer: Self-pay | Admitting: *Deleted

## 2018-07-03 NOTE — Telephone Encounter (Signed)
Per Dr. Benay Spice: Stop ibrutinib until symptoms are resolved, then if willing resume at 140 mg daily. Follow up as scheduled on 12/18. She was notified and agrees to this plan. Asking for refill on Tramadol and this was called in.

## 2018-07-03 NOTE — Telephone Encounter (Signed)
Called to report her joint pain in her knees today is so severe "8" that she can't go to work. Also right shoulder is very painful. Has been alternating 140 mg with 280 mg of ibrutinib since 06/25/18 office visit. Next appointment is 12/18, but can come sooner if he feels it is necessary. What should she do?

## 2018-07-04 ENCOUNTER — Telehealth: Payer: Self-pay | Admitting: *Deleted

## 2018-07-04 MED ORDER — HYDROCODONE-ACETAMINOPHEN 5-325 MG PO TABS: 1.0000 | ORAL_TABLET | ORAL | 0 refills | Status: DC | PRN

## 2018-07-04 NOTE — Telephone Encounter (Signed)
MD approved Vicodin 5/325 prn for pain. She will have husband pick up the script at [facility] today. Informed her to call to be seen if not better soon.

## 2018-07-04 NOTE — Telephone Encounter (Signed)
Patient called to request something stronger for her pain?  Tramadol is not helping. Pain so severe in knees that she had to have help getting off the toilet last night.

## 2018-07-05 ENCOUNTER — Telehealth: Payer: Self-pay | Admitting: *Deleted

## 2018-07-05 ENCOUNTER — Inpatient Hospital Stay: Payer: BLUE CROSS/BLUE SHIELD

## 2018-07-05 ENCOUNTER — Other Ambulatory Visit: Payer: Self-pay | Admitting: Oncology

## 2018-07-05 ENCOUNTER — Telehealth: Payer: Self-pay

## 2018-07-05 ENCOUNTER — Inpatient Hospital Stay (HOSPITAL_BASED_OUTPATIENT_CLINIC_OR_DEPARTMENT_OTHER): Payer: BLUE CROSS/BLUE SHIELD | Admitting: Nurse Practitioner

## 2018-07-05 VITALS — BP 97/48 | HR 97 | Temp 99.4°F | Resp 18 | Ht 66.0 in | Wt 127.1 lb

## 2018-07-05 DIAGNOSIS — D801 Nonfamilial hypogammaglobulinemia: Secondary | ICD-10-CM

## 2018-07-05 DIAGNOSIS — D630 Anemia in neoplastic disease: Secondary | ICD-10-CM | POA: Diagnosis not present

## 2018-07-05 DIAGNOSIS — E039 Hypothyroidism, unspecified: Secondary | ICD-10-CM

## 2018-07-05 DIAGNOSIS — C9110 Chronic lymphocytic leukemia of B-cell type not having achieved remission: Secondary | ICD-10-CM

## 2018-07-05 DIAGNOSIS — Z8701 Personal history of pneumonia (recurrent): Secondary | ICD-10-CM | POA: Diagnosis not present

## 2018-07-05 DIAGNOSIS — Z79899 Other long term (current) drug therapy: Secondary | ICD-10-CM | POA: Diagnosis not present

## 2018-07-05 DIAGNOSIS — J3489 Other specified disorders of nose and nasal sinuses: Secondary | ICD-10-CM | POA: Diagnosis not present

## 2018-07-05 DIAGNOSIS — M25511 Pain in right shoulder: Secondary | ICD-10-CM | POA: Diagnosis not present

## 2018-07-05 DIAGNOSIS — C9190 Lymphoid leukemia, unspecified not having achieved remission: Secondary | ICD-10-CM | POA: Diagnosis not present

## 2018-07-05 DIAGNOSIS — B1910 Unspecified viral hepatitis B without hepatic coma: Secondary | ICD-10-CM | POA: Diagnosis not present

## 2018-07-05 LAB — LACTATE DEHYDROGENASE: LDH: 146 U/L (ref 98–192)

## 2018-07-05 LAB — SAMPLE TO BLOOD BANK

## 2018-07-05 LAB — CMP (CANCER CENTER ONLY)
ALT: 11 U/L (ref 0–44)
AST: 12 U/L — ABNORMAL LOW (ref 15–41)
Albumin: 3.6 g/dL (ref 3.5–5.0)
Alkaline Phosphatase: 71 U/L (ref 38–126)
Anion gap: 7 (ref 5–15)
BUN: 11 mg/dL (ref 6–20)
CO2: 26 mmol/L (ref 22–32)
Calcium: 9.3 mg/dL (ref 8.9–10.3)
Chloride: 103 mmol/L (ref 98–111)
Creatinine: 0.83 mg/dL (ref 0.44–1.00)
GFR, Est AFR Am: >60 mL/min (ref >60)
GFR, Estimated: >60 mL/min (ref >60)
Glucose, Bld: 156 mg/dL — ABNORMAL HIGH (ref 70–99)
Potassium: 4 mmol/L (ref 3.5–5.1)
Sodium: 136 mmol/L (ref 135–145)
Total Bilirubin: 0.5 mg/dL (ref 0.3–1.2)
Total Protein: 5.9 g/dL — ABNORMAL LOW (ref 6.5–8.1)

## 2018-07-05 LAB — CBC WITH DIFFERENTIAL (CANCER CENTER ONLY)
Abs Immature Granulocytes: 0.07 10*3/uL (ref 0.00–0.07)
Basophils Absolute: 0.1 10*3/uL (ref 0.0–0.1)
Basophils Relative: 0 %
Eosinophils Absolute: 0 10*3/uL (ref 0.0–0.5)
Eosinophils Relative: 0 %
HCT: 20 % — ABNORMAL LOW (ref 36.0–46.0)
Hemoglobin: 6.3 g/dL — CL (ref 12.0–15.0)
Immature Granulocytes: 0 %
Lymphocytes Relative: 91 %
Lymphs Abs: 23.4 10*3/uL — ABNORMAL HIGH (ref 0.7–4.0)
MCH: 28.9 pg (ref 26.0–34.0)
MCHC: 31.5 g/dL (ref 30.0–36.0)
MCV: 91.7 fL (ref 80.0–100.0)
Monocytes Absolute: 0.3 10*3/uL (ref 0.1–1.0)
Monocytes Relative: 1 %
Neutro Abs: 2.2 10*3/uL (ref 1.7–7.7)
Neutrophils Relative %: 8 %
Platelet Count: 112 10*3/uL — ABNORMAL LOW (ref 150–400)
RBC: 2.18 MIL/uL — ABNORMAL LOW (ref 3.87–5.11)
RDW: 16.4 % — ABNORMAL HIGH (ref 11.5–15.5)
WBC Count: 26.1 10*3/uL — ABNORMAL HIGH (ref 4.0–10.5)
nRBC: 0 % (ref 0.0–0.2)

## 2018-07-05 LAB — PREPARE RBC (CROSSMATCH)

## 2018-07-05 LAB — RETICULOCYTES
Immature Retic Fract: 0 % — ABNORMAL LOW (ref 2.3–15.9)
RBC.: 2.18 MIL/uL — ABNORMAL LOW (ref 3.87–5.11)
Retic Count, Absolute: 4.1 10*3/uL — ABNORMAL LOW (ref 19.0–186.0)
Retic Ct Pct: 0.2 % — ABNORMAL LOW (ref 0.4–3.1)

## 2018-07-05 MED ORDER — OXYCODONE-ACETAMINOPHEN 5-325 MG PO TABS
ORAL_TABLET | ORAL | Status: AC
  Filled 2018-07-05: qty 1

## 2018-07-05 MED ORDER — OXYCODONE-ACETAMINOPHEN 5-325 MG PO TABS: 1.0000 | ORAL_TABLET | Freq: Four times a day (QID) | ORAL | 0 refills | Status: DC | PRN

## 2018-07-05 MED ORDER — SODIUM CHLORIDE 0.9% IV SOLUTION
250.0000 mL | Freq: Once | INTRAVENOUS | Status: AC
  Administered 2018-07-05: 250 mL via INTRAVENOUS
  Filled 2018-07-05: qty 250

## 2018-07-05 MED ORDER — OXYCODONE-ACETAMINOPHEN 5-325 MG PO TABS
1.0000 | ORAL_TABLET | Freq: Once | ORAL | Status: AC
  Administered 2018-07-05: 1 via ORAL

## 2018-07-05 NOTE — Patient Instructions (Signed)

## 2018-07-05 NOTE — Telephone Encounter (Signed)
Per Meagan Walsh to add patient for labs, and o/v with her per 12/13 walk in (aproved).

## 2018-07-05 NOTE — Progress Notes (Addendum)
[place] OFFICE PROGRESS NOTE   Diagnosis: CLL  INTERVAL HISTORY:   Meagan Walsh returns prior to scheduled follow-up due to joint pain and feeling like she needs a blood transfusion.  She developed bilateral knee and right shoulder pain as well as discomfort over the soles of her feet after increasing the ibrutinib dose following her last visit.  The symptoms began around day 3 or 4.  She is now taking hydrocodone with partial relief.  Today she notes mild improvement in the pain.  The ibrutinib is on hold.  Husband reports she appears pale.  She has mild dyspnea.  She can "feel" her heartbeat.  In the past this has occurred with progressive anemia.  Objective:  Vital signs in last 24 hours:  Blood pressure (!) 97/48, pulse 97, temperature 99.4 F (37.4 C), temperature source Oral, resp. rate 18, height '5\' 6"'  (1.676 m), weight 127 lb 1.6 oz (57.7 kg), SpO2 100 %.    HEENT: No thrush or ulcers. Lymphatics: No palpable cervical, supraclavicular, axillary or left inguinal lymph nodes.  Half centimeter right inguinal lymph node. Resp: Lungs clear bilaterally. Cardio: Regular rate and rhythm. GI: Abdomen soft and nontender.  No hepatosplenomegaly. Vascular: No leg edema. Neuro: Alert and oriented. Skin: Pale appearing.   Lab Results:  Lab Results  Component Value Date   WBC 26.1 (H) 07/05/2018   HGB 6.3 (LL) 07/05/2018   HCT 20.0 (L) 07/05/2018   MCV 91.7 07/05/2018   PLT 112 (L) 07/05/2018   NEUTROABS PENDING 07/05/2018    Imaging:  No results found.  Medications: I have reviewed the patient's current medications.  Assessment/Plan: 1.CLL-diagnosed in August 2010, flow cytometry consistent with CLL  Enlarged leftinguinal lymph node January 2019,smallneck/axillary nodes and palpable splenomegaly 09/11/2017  CTson 09/17/2017-3 cm necrotic appearing lymph node in the left inguinal region, borderline enlarged pelvic/retroperitoneal, chest, and axillary  nodes. Mild spinal megaly.  Ultrasound-guided biopsy of the left inguinal lymph node 09/18/2017, slightly "purulent "fluid aspirated, core biopsy is consistent with an atypical lymphoid proliferation-extensive necrosis with surrounding epithelioid histiocytes, limited intact lymphoid tissue involved with CLL  Incisional biopsy of a necrotic/purulent left inguinal lymph node on 10/01/2017-extensive necrosis with granulomatous inflammation, small amount of viable lymphoid tissue involved with CLL, AFB and fungal stains negative  Peripheral blood FISH analysis 02/05/2018- deletion 13q14, no evidence of p53 (17p13) deletion, no evidence of 11q22deletion  Bone marrow biopsy 02/26/2018-hypercellular marrow with extensive involvement by CLL, lymphocytes represent85% of all cells  Ibrutinib initiated 04/03/2018  Ibrutinib placed on hold 04/11/2018 due to onset of arthralgias  Ibrutinib resumed 04/16/2018, discontinued 04/25/2018 secondary to severe arthralgias/arthritis  Ibrutinib resumed at a dose of 140 mg daily 05/03/2018  Ibrutinib dose adjusted to 140 mg alternating with 280 mg 06/25/2018 2.Hypothyroidism 3.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 Augmentin prescribed. 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  She will continue to hold ibrutinib.  Prescription was sent to her pharmacy for Percocet 1 tablet every 6 hours as needed.  She has progressive symptomatic anemia.  She will receive 2 units of red blood cells today.  She will return for lab and follow-up as scheduled 07/09/2018.  Patient seen with Dr.  Benay Spice.    Ned Card ANP/GNP-BC  07/05/2018  9:13 AM  This was a shared visit with Ned Card.  Meagan Walsh was interviewed and examined.  She has increased arthralgias since beginning ibrutinib at a higher dose.  Ibrutinib has been placed on hold.  She has persistent severe transfusion dependent anemia.  I discussed treatment options at length with [name] and her husband.  There is evidence of a clinical response to the ibrutinib including improvement in neutropenia/thrombocytopenia, decreased peripheral lymphadenopathy, and weight gain.  However she has persistent severe anemia.  The anemia may be related to CLL or toxicity from ibrutinib.  We will consider switching to a Alcalbrutinib or venetoclax.  She will be transfused 2 units of packed red blood cells today.  She will return for an office visit on 07/09/2018.  25 minutes were spent with the patient today.  The majority of the time was used for counseling and coordination of care.  Addendum: I spoke to Ms. Cronk at approximately 5:45 PM.  She developed a fever of 101 when she returned home from the transfusion today. No chills, cough, sore throat, dyspnea, dysuria or other symptoms of an infection.  She otherwise feels well.  The fever may be related to a febrile transfusion reaction or an early symptom of an infection.  She has a h/o recurrent pneumonia. She will take tylenol. She will go to the ER for a persistent fever or new symptoms.

## 2018-07-05 NOTE — Telephone Encounter (Signed)
Per lab, today's HGB = 6.3.  Results called to collaborative at 9:10 am.

## 2018-07-06 LAB — TYPE AND SCREEN
ABO/RH(D): O POS
Antibody Screen: NEGATIVE
Unit division: 0
Unit division: 0

## 2018-07-06 LAB — BPAM RBC
Blood Product Expiration Date: 202001072359
Blood Product Expiration Date: 202001082359
ISSUE DATE / TIME: 201912130955
ISSUE DATE / TIME: 201912130955
Unit Type and Rh: 5100
Unit Type and Rh: 5100

## 2018-07-09 ENCOUNTER — Inpatient Hospital Stay: Payer: BLUE CROSS/BLUE SHIELD

## 2018-07-09 ENCOUNTER — Inpatient Hospital Stay (HOSPITAL_BASED_OUTPATIENT_CLINIC_OR_DEPARTMENT_OTHER): Payer: BLUE CROSS/BLUE SHIELD | Admitting: Oncology

## 2018-07-09 ENCOUNTER — Telehealth: Payer: Self-pay | Admitting: Oncology

## 2018-07-09 ENCOUNTER — Other Ambulatory Visit: Payer: Self-pay | Admitting: *Deleted

## 2018-07-09 VITALS — BP 121/67 | HR 92 | Temp 98.3°F | Resp 18 | Ht 66.0 in | Wt 126.9 lb

## 2018-07-09 DIAGNOSIS — C9190 Lymphoid leukemia, unspecified not having achieved remission: Secondary | ICD-10-CM

## 2018-07-09 DIAGNOSIS — M25511 Pain in right shoulder: Secondary | ICD-10-CM | POA: Diagnosis not present

## 2018-07-09 DIAGNOSIS — J3489 Other specified disorders of nose and nasal sinuses: Secondary | ICD-10-CM

## 2018-07-09 DIAGNOSIS — C9110 Chronic lymphocytic leukemia of B-cell type not having achieved remission: Secondary | ICD-10-CM

## 2018-07-09 DIAGNOSIS — Z79899 Other long term (current) drug therapy: Secondary | ICD-10-CM | POA: Diagnosis not present

## 2018-07-09 DIAGNOSIS — E039 Hypothyroidism, unspecified: Secondary | ICD-10-CM

## 2018-07-09 DIAGNOSIS — D630 Anemia in neoplastic disease: Secondary | ICD-10-CM | POA: Diagnosis not present

## 2018-07-09 DIAGNOSIS — D801 Nonfamilial hypogammaglobulinemia: Secondary | ICD-10-CM

## 2018-07-09 DIAGNOSIS — B1910 Unspecified viral hepatitis B without hepatic coma: Secondary | ICD-10-CM | POA: Diagnosis not present

## 2018-07-09 DIAGNOSIS — Z8701 Personal history of pneumonia (recurrent): Secondary | ICD-10-CM | POA: Diagnosis not present

## 2018-07-09 LAB — CBC WITH DIFFERENTIAL (CANCER CENTER ONLY)
Abs Immature Granulocytes: 0 10*3/uL (ref 0.00–0.07)
Basophils Absolute: 0.2 10*3/uL — ABNORMAL HIGH (ref 0.0–0.1)
Basophils Relative: 1 %
Eosinophils Absolute: 0.2 10*3/uL (ref 0.0–0.5)
Eosinophils Relative: 1 %
HCT: 27.2 % — ABNORMAL LOW (ref 36.0–46.0)
Hemoglobin: 8.9 g/dL — ABNORMAL LOW (ref 12.0–15.0)
Lymphocytes Relative: 96 %
Lymphs Abs: 18 10*3/uL — ABNORMAL HIGH (ref 0.7–4.0)
MCH: 28.3 pg (ref 26.0–34.0)
MCHC: 32.7 g/dL (ref 30.0–36.0)
MCV: 86.6 fL (ref 80.0–100.0)
Monocytes Absolute: 0 10*3/uL — ABNORMAL LOW (ref 0.1–1.0)
Monocytes Relative: 0 %
Neutro Abs: 0.4 10*3/uL — CL (ref 1.7–17.7)
Neutrophils Relative %: 2 %
Platelet Count: 107 10*3/uL — ABNORMAL LOW (ref 150–400)
RBC: 3.14 MIL/uL — ABNORMAL LOW (ref 3.87–5.11)
RDW: 16.8 % — ABNORMAL HIGH (ref 11.5–15.5)
WBC Count: 18.8 10*3/uL — ABNORMAL HIGH (ref 4.0–10.5)
nRBC: 0 % (ref 0.0–0.2)

## 2018-07-09 LAB — CMP (CANCER CENTER ONLY)
ALT: 16 U/L (ref 0–44)
AST: 15 U/L (ref 15–41)
Albumin: 3.6 g/dL (ref 3.5–5.0)
Alkaline Phosphatase: 78 U/L (ref 38–126)
Anion gap: 9 (ref 5–15)
BUN: 13 mg/dL (ref 6–20)
CO2: 26 mmol/L (ref 22–32)
Calcium: 9.2 mg/dL (ref 8.9–10.3)
Chloride: 109 mmol/L (ref 98–111)
Creatinine: 0.73 mg/dL (ref 0.44–1.00)
GFR, Est AFR Am: >60 mL/min (ref >60)
GFR, Estimated: >60 mL/min (ref >60)
Glucose, Bld: 103 mg/dL — ABNORMAL HIGH (ref 70–99)
Potassium: 4.3 mmol/L (ref 3.5–5.1)
Sodium: 144 mmol/L (ref 135–145)
Total Bilirubin: 0.3 mg/dL (ref 0.3–1.2)
Total Protein: 6 g/dL — ABNORMAL LOW (ref 6.5–8.1)

## 2018-07-09 LAB — SAMPLE TO BLOOD BANK

## 2018-07-09 MED ORDER — AZITHROMYCIN 250 MG PO TABS: ORAL_TABLET | ORAL | 0 refills | Status: DC

## 2018-07-09 NOTE — Telephone Encounter (Signed)
Printed calendar and avs. °

## 2018-07-09 NOTE — Progress Notes (Signed)
[place] OFFICE PROGRESS NOTE   Diagnosis: CLL  INTERVAL HISTORY:   Meagan Walsh returns as scheduled.  She was transfused 2 units of packed red blood cells on 07/05/2018.  She reports feeling much better after the red cell transfusion.  She had a fever of 101 degrees after the red cell transfusion 07/05/2018.  No further fever.  She developed sinus congestion and rhinorrhea over the weekend.  This has persisted.  She has a cough.  She also has congestion in the left ear.  She does not feel like she is developing pneumonia.  Arthralgias have resolved.  Objective:  Vital signs in last 24 hours:  Blood pressure 121/67, pulse 92, temperature 98.3 F (36.8 C), temperature source Oral, resp. rate 18, height '5\' 6"'  (1.676 m), weight 126 lb 14.4 oz (57.6 kg), SpO2 100 %.    HEENT: Oropharynx without erythema or exudate, the right tympanic membrane is clear, slight cloudy discoloration at the left tympanic membrane Lymphatics: No cervical or supraclavicular nodes.  1/2 cm right inguinal node Resp: Lungs clear bilaterally, no respiratory distress Cardio: Regular rate and rhythm GI: No hepatomegaly, spleen tip?  At the left costal margin Vascular: No leg edema      Lab Results:  Lab Results  Component Value Date   WBC 18.8 (H) 07/09/2018   HGB 8.9 (L) 07/09/2018   HCT 27.2 (L) 07/09/2018   MCV 86.6 07/09/2018   PLT 107 (L) 07/09/2018   NEUTROABS PENDING 07/09/2018    CMP  Lab Results  Component Value Date   NA 144 07/09/2018   K 4.3 07/09/2018   CL 109 07/09/2018   CO2 26 07/09/2018   GLUCOSE 103 (H) 07/09/2018   BUN 13 07/09/2018   CREATININE 0.73 07/09/2018   CALCIUM 9.2 07/09/2018   PROT 6.0 (L) 07/09/2018   ALBUMIN 3.6 07/09/2018   AST 15 07/09/2018   ALT 16 07/09/2018   ALKPHOS 78 07/09/2018   BILITOT 0.3 07/09/2018   GFRNONAA >60 07/09/2018   GFRAA >60 07/09/2018     Medications: I have reviewed the patient's current  medications.   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 Mild spinal megaly.  Ultrasound-guided biopsy of the left inguinal lymph node 09/18/2017, slightly "purulent "fluid aspirated, core biopsy is consistent with an atypical lymphoid proliferation-extensive necrosis with surrounding epithelioid histiocytes, limited intact lymphoid tissue involved with CLL  Incisional biopsy of a necrotic/purulent left inguinal lymph node on 10/01/2017-extensive necrosis with granulomatous inflammation, small amount of viable lymphoid tissue involved with CLL, AFB and fungal stains negative  Peripheral blood FISH analysis 02/05/2018- deletion 13q14, no evidence of p53 (17p13) deletion, no evidence of 11q22deletion  Bone marrow biopsy 02/26/2018-hypercellular marrow with extensive involvement by CLL, lymphocytes represent85% of all cells  Ibrutinib initiated 04/03/2018  Ibrutinib placed on hold 04/11/2018 due to onset of arthralgias  Ibrutinib resumed 04/16/2018, discontinued 04/25/2018 secondary to severe arthralgias/arthritis  Ibrutinib resumed at a dose of 140 mg daily 05/03/2018  Ibrutinib dose adjusted to 140 mg alternating with 280 mg 06/25/2018  Ibrutinib discontinued 07/03/2018 secondary to severe arthralgias 2.Hypothyroidism 3.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 Augmentin prescribed. 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  She has been treated intermittently with ibrutinib since 04/03/2018.  She has persistent severe anemia.  Ibrutinib tolerance has been limited by arthralgias.  I discussed treatment options at length with Meagan Walsh and her husband.  There is evidence of a response to the ibrutinib based on her weight gain, improvement in lymphadenopathy, and initial improvement in neutropenia/thrombocytopenia.  However she has persistent transfusion dependent anemia.  She has neutropenia today.  It is unclear whether the persistent anemia is related to CLL or toxicity from ibrutinib.  We decided to place the ibrutinib on hold to see if she will have hematologic recovery over the next few weeks.  She plans a trip out of the country next week.[name]  She will complete a course of azithromycin for the upper respiratory infection.  She will return for a CBC on 07/12/2018 and 07/15/2018.  We will arrange for red cell transfusion support as needed.  She will begin Levaquin prophylaxis if the neutrophil count remains low.  We discussed continuing low-dose ibrutinib, switching to alcalbrutinib, venetoclax, and rituximab based therapies.  I plan to make a referral to [facility] or [facility] for a second opinion.  We also discussed obtaining a restaging bone marrow biopsy for persistent anemia.  There is been no evidence of hemolysis.  30 minutes were spent with the patient today.  The majority of the time was used for counseling and coordination of care.

## 2018-07-11 ENCOUNTER — Telehealth: Payer: Self-pay | Admitting: *Deleted

## 2018-07-11 NOTE — Telephone Encounter (Signed)
Spoke with referral coordinator, Tyler Pita [phone]) with Dr. Prince Solian at [facility]. He informed Dr. Benay Spice he can see patient on 12/23. Faxed pathology/cytogenetics with demographic information to [facility] at [phone] as requested. She will call patient with appointment. This RN called patient and left VM that [facility] is going to call her for an appointment on 12/23 and to please have her cell phone handy so she can answer when they call.

## 2018-07-12 ENCOUNTER — Telehealth: Payer: Self-pay | Admitting: *Deleted

## 2018-07-12 ENCOUNTER — Inpatient Hospital Stay: Payer: BLUE CROSS/BLUE SHIELD

## 2018-07-12 DIAGNOSIS — Z8701 Personal history of pneumonia (recurrent): Secondary | ICD-10-CM | POA: Diagnosis not present

## 2018-07-12 DIAGNOSIS — D801 Nonfamilial hypogammaglobulinemia: Secondary | ICD-10-CM | POA: Diagnosis not present

## 2018-07-12 DIAGNOSIS — C9110 Chronic lymphocytic leukemia of B-cell type not having achieved remission: Secondary | ICD-10-CM

## 2018-07-12 DIAGNOSIS — M25511 Pain in right shoulder: Secondary | ICD-10-CM | POA: Diagnosis not present

## 2018-07-12 DIAGNOSIS — Z79899 Other long term (current) drug therapy: Secondary | ICD-10-CM | POA: Diagnosis not present

## 2018-07-12 DIAGNOSIS — E039 Hypothyroidism, unspecified: Secondary | ICD-10-CM | POA: Diagnosis not present

## 2018-07-12 DIAGNOSIS — B1910 Unspecified viral hepatitis B without hepatic coma: Secondary | ICD-10-CM | POA: Diagnosis not present

## 2018-07-12 DIAGNOSIS — D630 Anemia in neoplastic disease: Secondary | ICD-10-CM | POA: Diagnosis not present

## 2018-07-12 DIAGNOSIS — J3489 Other specified disorders of nose and nasal sinuses: Secondary | ICD-10-CM | POA: Diagnosis not present

## 2018-07-12 DIAGNOSIS — C9190 Lymphoid leukemia, unspecified not having achieved remission: Secondary | ICD-10-CM | POA: Diagnosis not present

## 2018-07-12 LAB — CBC WITH DIFFERENTIAL (CANCER CENTER ONLY)
Abs Immature Granulocytes: 0.02 10*3/uL (ref 0.00–0.07)
Basophils Absolute: 0 10*3/uL (ref 0.0–0.1)
Basophils Relative: 0 %
Eosinophils Absolute: 0.1 10*3/uL (ref 0.0–0.5)
Eosinophils Relative: 1 %
HCT: 27 % — ABNORMAL LOW (ref 36.0–46.0)
Hemoglobin: 8.7 g/dL — ABNORMAL LOW (ref 12.0–15.0)
Immature Granulocytes: 0 %
Lymphocytes Relative: 81 %
Lymphs Abs: 9 10*3/uL — ABNORMAL HIGH (ref 0.7–4.0)
MCH: 28.1 pg (ref 26.0–34.0)
MCHC: 32.2 g/dL (ref 30.0–36.0)
MCV: 87.1 fL (ref 80.0–100.0)
Monocytes Absolute: 0.3 10*3/uL (ref 0.1–1.0)
Monocytes Relative: 3 %
Neutro Abs: 1.6 10*3/uL — ABNORMAL LOW (ref 1.7–7.7)
Neutrophils Relative %: 15 %
Platelet Count: 101 10*3/uL — ABNORMAL LOW (ref 150–400)
RBC: 3.1 MIL/uL — ABNORMAL LOW (ref 3.87–5.11)
RDW: 16.8 % — ABNORMAL HIGH (ref 11.5–15.5)
WBC Count: 11 10*3/uL — ABNORMAL HIGH (ref 4.0–10.5)
nRBC: 0 % (ref 0.0–0.2)

## 2018-07-12 LAB — SAMPLE TO BLOOD BANK

## 2018-07-12 LAB — DIRECT ANTIGLOBULIN TEST (NOT AT ARMC)
DAT, IgG: NEGATIVE
DAT, complement: NEGATIVE

## 2018-07-12 NOTE — Telephone Encounter (Signed)
Called and left VM with lab results today. All are stable and ANC is better. Come in on 12/24 as scheduled for labs/transfusion. Provided contact # for Dr. Maudie Mercury referral coordinator in case she has not heard from them. He was planning on seeing her on 07/15/18.

## 2018-07-15 ENCOUNTER — Encounter
Admit: 2018-07-15 | Discharge: 2018-07-15 | Payer: PRIVATE HEALTH INSURANCE | Attending: Hematology & Oncology | Primary: Hematology & Oncology

## 2018-07-15 ENCOUNTER — Other Ambulatory Visit: Payer: Self-pay | Admitting: *Deleted

## 2018-07-15 ENCOUNTER — Telehealth: Payer: Self-pay | Admitting: *Deleted

## 2018-07-15 DIAGNOSIS — C9110 Chronic lymphocytic leukemia of B-cell type not having achieved remission: Secondary | ICD-10-CM

## 2018-07-15 DIAGNOSIS — D649 Anemia, unspecified: Secondary | ICD-10-CM

## 2018-07-15 MED ORDER — CIPROFLOXACIN HCL 500 MG PO TABS: 500.0000 mg | ORAL_TABLET | Freq: Two times a day (BID) | ORAL | 0 refills | Status: DC

## 2018-07-15 NOTE — Telephone Encounter (Signed)
Left VM x2 this morning asking for the antibiotic Dr. Benay Spice was going to call in for her to take with her when she goes out of the country. Finished her zpack today.  She also is requesting premeds for blood due to having a fever at home after her last transfusion. Per Dr. Benay Spice: OK for Cipro 500 mg po bid x 5 days (start for s/s of infection or temp >101). Appointment at [facility] was rescheduled at patient's request for 07/29/18. Called back and made patient aware of the cipro script and when to start and that premeds were ordered per her request.

## 2018-07-16 ENCOUNTER — Inpatient Hospital Stay: Payer: BLUE CROSS/BLUE SHIELD

## 2018-07-16 ENCOUNTER — Other Ambulatory Visit: Payer: Self-pay | Admitting: *Deleted

## 2018-07-16 DIAGNOSIS — E039 Hypothyroidism, unspecified: Secondary | ICD-10-CM | POA: Diagnosis not present

## 2018-07-16 DIAGNOSIS — C9190 Lymphoid leukemia, unspecified not having achieved remission: Secondary | ICD-10-CM | POA: Diagnosis not present

## 2018-07-16 DIAGNOSIS — D649 Anemia, unspecified: Secondary | ICD-10-CM

## 2018-07-16 DIAGNOSIS — C9110 Chronic lymphocytic leukemia of B-cell type not having achieved remission: Secondary | ICD-10-CM

## 2018-07-16 DIAGNOSIS — J3489 Other specified disorders of nose and nasal sinuses: Secondary | ICD-10-CM | POA: Diagnosis not present

## 2018-07-16 DIAGNOSIS — Z79899 Other long term (current) drug therapy: Secondary | ICD-10-CM | POA: Diagnosis not present

## 2018-07-16 DIAGNOSIS — D630 Anemia in neoplastic disease: Secondary | ICD-10-CM | POA: Diagnosis not present

## 2018-07-16 DIAGNOSIS — B1910 Unspecified viral hepatitis B without hepatic coma: Secondary | ICD-10-CM | POA: Diagnosis not present

## 2018-07-16 DIAGNOSIS — Z8701 Personal history of pneumonia (recurrent): Secondary | ICD-10-CM | POA: Diagnosis not present

## 2018-07-16 DIAGNOSIS — D801 Nonfamilial hypogammaglobulinemia: Secondary | ICD-10-CM | POA: Diagnosis not present

## 2018-07-16 DIAGNOSIS — M25511 Pain in right shoulder: Secondary | ICD-10-CM | POA: Diagnosis not present

## 2018-07-16 LAB — CBC WITH DIFFERENTIAL (CANCER CENTER ONLY)
Abs Immature Granulocytes: 0.01 10*3/uL (ref 0.00–0.07)
Basophils Absolute: 0 10*3/uL (ref 0.0–0.1)
Basophils Relative: 0 %
Eosinophils Absolute: 0.1 10*3/uL (ref 0.0–0.5)
Eosinophils Relative: 1 %
HCT: 25.5 % — ABNORMAL LOW (ref 36.0–46.0)
Hemoglobin: 8.2 g/dL — ABNORMAL LOW (ref 12.0–15.0)
Immature Granulocytes: 0 %
Lymphocytes Relative: 81 %
Lymphs Abs: 8.6 10*3/uL — ABNORMAL HIGH (ref 0.7–4.0)
MCH: 27.7 pg (ref 26.0–34.0)
MCHC: 32.2 g/dL (ref 30.0–36.0)
MCV: 86.1 fL (ref 80.0–100.0)
Monocytes Absolute: 0.2 10*3/uL (ref 0.1–1.0)
Monocytes Relative: 2 %
Neutro Abs: 1.7 10*3/uL (ref 1.7–7.7)
Neutrophils Relative %: 16 %
Platelet Count: 87 10*3/uL — ABNORMAL LOW (ref 150–400)
RBC: 2.96 MIL/uL — ABNORMAL LOW (ref 3.87–5.11)
RDW: 16.1 % — ABNORMAL HIGH (ref 11.5–15.5)
WBC Count: 10.7 10*3/uL — ABNORMAL HIGH (ref 4.0–10.5)
nRBC: 0 % (ref 0.0–0.2)

## 2018-07-16 LAB — PREPARE RBC (CROSSMATCH)

## 2018-07-16 LAB — SAMPLE TO BLOOD BANK

## 2018-07-16 MED ORDER — DIPHENHYDRAMINE HCL 25 MG PO CAPS
ORAL_CAPSULE | ORAL | Status: AC
  Filled 2018-07-16: qty 1

## 2018-07-16 MED ORDER — DIPHENHYDRAMINE HCL 25 MG PO CAPS
25.0000 mg | ORAL_CAPSULE | Freq: Once | ORAL | Status: AC
  Administered 2018-07-16: 25 mg via ORAL

## 2018-07-16 MED ORDER — ACETAMINOPHEN 325 MG PO TABS
650.0000 mg | ORAL_TABLET | Freq: Once | ORAL | Status: AC
  Administered 2018-07-16: 650 mg via ORAL

## 2018-07-16 MED ORDER — ACETAMINOPHEN 325 MG PO TABS
ORAL_TABLET | ORAL | Status: AC
  Filled 2018-07-16: qty 2

## 2018-07-16 MED ORDER — SODIUM CHLORIDE 0.9% IV SOLUTION
250.0000 mL | Freq: Once | INTRAVENOUS | Status: AC
  Administered 2018-07-16: 250 mL via INTRAVENOUS
  Filled 2018-07-16: qty 250

## 2018-07-16 NOTE — Patient Instructions (Signed)

## 2018-07-20 LAB — BPAM RBC
Blood Product Expiration Date: 202001202359
Blood Product Expiration Date: 202001202359
ISSUE DATE / TIME: 201912240940
ISSUE DATE / TIME: 201912240940
Unit Type and Rh: 5100
Unit Type and Rh: 5100

## 2018-07-20 LAB — TYPE AND SCREEN
ABO/RH(D): O POS
Antibody Screen: NEGATIVE
Unit division: 0
Unit division: 0

## 2018-07-21 ENCOUNTER — Encounter: Payer: Self-pay | Admitting: Emergency Medicine

## 2018-07-21 DIAGNOSIS — F40240 Claustrophobia: Secondary | ICD-10-CM

## 2018-07-29 ENCOUNTER — Inpatient Hospital Stay: Payer: BLUE CROSS/BLUE SHIELD | Admitting: Nurse Practitioner

## 2018-07-29 ENCOUNTER — Inpatient Hospital Stay: Payer: BLUE CROSS/BLUE SHIELD | Attending: Oncology

## 2018-07-29 ENCOUNTER — Telehealth: Payer: Self-pay | Admitting: Nurse Practitioner

## 2018-07-29 ENCOUNTER — Encounter: Payer: Self-pay | Admitting: Nurse Practitioner

## 2018-07-29 VITALS — BP 117/62 | HR 90 | Temp 98.2°F | Resp 18 | Ht 66.0 in | Wt 127.2 lb

## 2018-07-29 DIAGNOSIS — C9190 Lymphoid leukemia, unspecified not having achieved remission: Secondary | ICD-10-CM | POA: Insufficient documentation

## 2018-07-29 DIAGNOSIS — M2550 Pain in unspecified joint: Secondary | ICD-10-CM | POA: Diagnosis not present

## 2018-07-29 DIAGNOSIS — E039 Hypothyroidism, unspecified: Secondary | ICD-10-CM | POA: Diagnosis not present

## 2018-07-29 DIAGNOSIS — D649 Anemia, unspecified: Secondary | ICD-10-CM

## 2018-07-29 DIAGNOSIS — D801 Nonfamilial hypogammaglobulinemia: Secondary | ICD-10-CM

## 2018-07-29 DIAGNOSIS — R0602 Shortness of breath: Secondary | ICD-10-CM | POA: Insufficient documentation

## 2018-07-29 DIAGNOSIS — B1910 Unspecified viral hepatitis B without hepatic coma: Secondary | ICD-10-CM

## 2018-07-29 DIAGNOSIS — Z9221 Personal history of antineoplastic chemotherapy: Secondary | ICD-10-CM | POA: Insufficient documentation

## 2018-07-29 DIAGNOSIS — C9110 Chronic lymphocytic leukemia of B-cell type not having achieved remission: Secondary | ICD-10-CM

## 2018-07-29 DIAGNOSIS — D696 Thrombocytopenia, unspecified: Secondary | ICD-10-CM

## 2018-07-29 DIAGNOSIS — D709 Neutropenia, unspecified: Secondary | ICD-10-CM | POA: Insufficient documentation

## 2018-07-29 LAB — RETICULOCYTES
Immature Retic Fract: 0 % — ABNORMAL LOW (ref 2.3–15.9)
RBC.: 2.84 MIL/uL — ABNORMAL LOW (ref 3.87–5.11)
Retic Count, Absolute: 1.7 10*3/uL — ABNORMAL LOW (ref 19.0–186.0)
Retic Ct Pct: 0.1 % — ABNORMAL LOW (ref 0.4–3.1)

## 2018-07-29 LAB — SAMPLE TO BLOOD BANK

## 2018-07-29 LAB — CBC WITH DIFFERENTIAL (CANCER CENTER ONLY)
Abs Immature Granulocytes: 0.02 10*3/uL (ref 0.00–0.07)
Basophils Absolute: 0 10*3/uL (ref 0.0–0.1)
Basophils Relative: 0 %
Eosinophils Absolute: 0.1 10*3/uL (ref 0.0–0.5)
Eosinophils Relative: 1 %
HCT: 24.5 % — ABNORMAL LOW (ref 36.0–46.0)
Hemoglobin: 8 g/dL — ABNORMAL LOW (ref 12.0–15.0)
Immature Granulocytes: 0 %
Lymphocytes Relative: 77 %
Lymphs Abs: 6.1 10*3/uL — ABNORMAL HIGH (ref 0.7–4.0)
MCH: 28.2 pg (ref 26.0–34.0)
MCHC: 32.7 g/dL (ref 30.0–36.0)
MCV: 86.3 fL (ref 80.0–100.0)
Monocytes Absolute: 0.2 10*3/uL (ref 0.1–1.0)
Monocytes Relative: 2 %
Neutro Abs: 1.6 10*3/uL — ABNORMAL LOW (ref 1.7–7.7)
Neutrophils Relative %: 20 %
Platelet Count: 27 10*3/uL — ABNORMAL LOW (ref 150–400)
RBC: 2.84 MIL/uL — ABNORMAL LOW (ref 3.87–5.11)
RDW: 14.5 % (ref 11.5–15.5)
WBC Count: 7.9 10*3/uL (ref 4.0–10.5)
nRBC: 0 % (ref 0.0–0.2)

## 2018-07-29 LAB — CMP (CANCER CENTER ONLY)
ALT: 25 U/L (ref 0–44)
AST: 20 U/L (ref 15–41)
Albumin: 4.3 g/dL (ref 3.5–5.0)
Alkaline Phosphatase: 83 U/L (ref 38–126)
Anion gap: 6 (ref 5–15)
BUN: 17 mg/dL (ref 6–20)
CO2: 27 mmol/L (ref 22–32)
Calcium: 9.6 mg/dL (ref 8.9–10.3)
Chloride: 106 mmol/L (ref 98–111)
Creatinine: 0.88 mg/dL (ref 0.44–1.00)
GFR, Est AFR Am: >60 mL/min (ref >60)
GFR, Estimated: >60 mL/min (ref >60)
Glucose, Bld: 100 mg/dL — ABNORMAL HIGH (ref 70–99)
Potassium: 4.1 mmol/L (ref 3.5–5.1)
Sodium: 139 mmol/L (ref 135–145)
Total Bilirubin: 0.6 mg/dL (ref 0.3–1.2)
Total Protein: 6.1 g/dL — ABNORMAL LOW (ref 6.5–8.1)

## 2018-07-29 NOTE — Progress Notes (Addendum)
[facility] OFFICE PROGRESS NOTE   Diagnosis: CLL  INTERVAL HISTORY:   Meagan Walsh returns as scheduled.  She was transfused 1 unit of blood 07/16/2018.  She continues to note improvement in her energy level.  No fevers or sweats.  No bleeding.  She has mild dyspnea on exertion.  She continues to have mild arthralgias involving the knees.  Objective:  Vital signs in last 24 hours:  Blood pressure 117/62, pulse 90, temperature 98.2 F (36.8 C), temperature source Oral, resp. rate 18, height _0  (1.676 m), weight 127 lb 3.2 oz (57.7 kg), SpO2 100 %.    HEENT: No thrush or ulcers. Lymphatics: Tiny bilateral cervical nodes.  No supraclavicular nodes.  Small right inguinal node. Resp: Lungs clear bilaterally. Cardio: Regular rate and rhythm. GI: Abdomen soft and nontender.  No hepatomegaly.  No definite splenomegaly. Vascular: No leg edema.    Lab Results:  Lab Results  Component Value Date   WBC 7.9 07/29/2018   HGB 8.0 (L) 07/29/2018   HCT 24.5 (L) 07/29/2018   MCV 86.3 07/29/2018   PLT 27 (L) 07/29/2018   NEUTROABS PENDING 07/29/2018  Peripheral blood smear reviewed by Dr. Karie Schwalbe platelets, no platelet clumps; increased number of mature appearing lymphocytes; a few ovalocytes, the polychromasia is not increased.  Imaging: None.   Medications: I have reviewed the patient's current medications. 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 Mild spinal megaly.  Ultrasound-guided biopsy of the left inguinal lymph node 09/18/2017, slightly "purulent "fluid aspirated, core biopsy is consistent with an atypical lymphoid proliferation-extensive  necrosis with surrounding epithelioid histiocytes, limited intact lymphoid tissue involved with CLL  Incisional biopsy of a necrotic/purulent left inguinal lymph node on 10/01/2017-extensive necrosis with granulomatous inflammation, small amount of viable lymphoid tissue involved with CLL, AFB and fungal stains negative  Peripheral blood FISH analysis 02/05/2018- deletion 13q14, no evidence of p53 (17p13) deletion, no evidence of 11q22deletion  Bone marrow biopsy 02/26/2018-hypercellular marrow with extensive involvement by CLL, lymphocytes represent85% of all cells  Ibrutinib initiated 04/03/2018  Ibrutinib placed on hold 04/11/2018 due to onset of arthralgias  Ibrutinib resumed 04/16/2018, discontinued 04/25/2018 secondary to severe arthralgias/arthritis  Ibrutinib resumed at a dose of 140 mg daily 05/03/2018  Ibrutinib dose adjusted to 140 mg alternating with 257m 06/25/2018  Ibrutinib discontinued 07/03/2018 secondary to severe arthralgias 2.Hypothyroidism 3.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 Augmentin prescribed. 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 NRheaumehas been off of ibrutinib since 07/03/2018.  She has persistent anemia requiring transfusion support, mild neutropenia and now progressive thrombocytopenia.  We are referring her for a restaging bone marrow biopsy.  She will return for lab and a possible blood transfusion on 08/02/2018.  [facility]  We will see her in follow-up here on 08/14/2018.  She will contact the office in the interim with any problems.  We specifically discussed fever, chills, other signs  of infection, bleeding.  Patient seen with Dr. SBenay Spice    LNed CardANP/GNP-BC   07/29/2018  2:16 PM  This  was a shared visit with Ned Card.  Meagan Walsh appears well.  She is again developing symptomatic anemia.  We will arrange for red cell transfusion this week.  She has developed progressive thrombocytopenia.  She knows to contact us for spontaneous bruising or bleeding.  She will return for a repeat CBC on 08/02/2018.  Treatment for the CLL remains on hold.  I discussed the case with Dr. Prince Solian at [facility].  She recommends proceeding with a restaging bone marrow biopsy and then acalabrutinib therapy if there is still significant involvement of the bone marrow with CLL.  Meagan Walsh is scheduled for an appointment at [facility] on 08/13/2018.  She will return for an office visit here on 08/14/2018.  [name], MD

## 2018-07-29 NOTE — Telephone Encounter (Signed)
Printed avs. °

## 2018-07-30 ENCOUNTER — Telehealth: Payer: Self-pay | Admitting: Oncology

## 2018-07-30 NOTE — Telephone Encounter (Signed)
Called patient about scheduled appointment for 01/11, patient is aware of appointment.

## 2018-07-31 ENCOUNTER — Telehealth: Payer: Self-pay | Admitting: *Deleted

## 2018-07-31 ENCOUNTER — Ambulatory Visit: Payer: Self-pay | Admitting: Psychiatry

## 2018-07-31 NOTE — Telephone Encounter (Signed)
Patient called again requesting to move her 1/22 office visit with Dr. Benay Spice to 08/16/18. Goes to [facility] on 1/21 and having labs, so she feels she does not need labs again when she sees Dr. Benay Spice. MD schedule is booked for 1/24. Left message for MD to provide guidance on her appointment reschedule.

## 2018-07-31 NOTE — Telephone Encounter (Signed)
Patient left VM requesting to change her 08/14/18 appointment with Dr. Benay Spice. Scheduling message sent for reschedule.

## 2018-08-01 ENCOUNTER — Telehealth: Payer: Self-pay | Admitting: Oncology

## 2018-08-01 NOTE — Telephone Encounter (Signed)
Called pt per 1/8 sch message - per patient keep as is .

## 2018-08-02 ENCOUNTER — Other Ambulatory Visit: Payer: Self-pay | Admitting: Radiology

## 2018-08-02 ENCOUNTER — Other Ambulatory Visit: Payer: BLUE CROSS/BLUE SHIELD

## 2018-08-02 ENCOUNTER — Inpatient Hospital Stay: Payer: BLUE CROSS/BLUE SHIELD

## 2018-08-02 ENCOUNTER — Other Ambulatory Visit: Payer: Self-pay | Admitting: *Deleted

## 2018-08-02 DIAGNOSIS — D649 Anemia, unspecified: Secondary | ICD-10-CM | POA: Diagnosis not present

## 2018-08-02 DIAGNOSIS — C9110 Chronic lymphocytic leukemia of B-cell type not having achieved remission: Secondary | ICD-10-CM

## 2018-08-02 DIAGNOSIS — D696 Thrombocytopenia, unspecified: Secondary | ICD-10-CM | POA: Diagnosis not present

## 2018-08-02 DIAGNOSIS — D801 Nonfamilial hypogammaglobulinemia: Secondary | ICD-10-CM | POA: Diagnosis not present

## 2018-08-02 DIAGNOSIS — R0602 Shortness of breath: Secondary | ICD-10-CM | POA: Diagnosis not present

## 2018-08-02 DIAGNOSIS — M2550 Pain in unspecified joint: Secondary | ICD-10-CM | POA: Diagnosis not present

## 2018-08-02 DIAGNOSIS — C9190 Lymphoid leukemia, unspecified not having achieved remission: Secondary | ICD-10-CM | POA: Diagnosis not present

## 2018-08-02 DIAGNOSIS — D709 Neutropenia, unspecified: Secondary | ICD-10-CM | POA: Diagnosis not present

## 2018-08-02 DIAGNOSIS — E039 Hypothyroidism, unspecified: Secondary | ICD-10-CM | POA: Diagnosis not present

## 2018-08-02 DIAGNOSIS — B1910 Unspecified viral hepatitis B without hepatic coma: Secondary | ICD-10-CM | POA: Diagnosis not present

## 2018-08-02 DIAGNOSIS — Z9221 Personal history of antineoplastic chemotherapy: Secondary | ICD-10-CM | POA: Diagnosis not present

## 2018-08-02 LAB — CBC WITH DIFFERENTIAL (CANCER CENTER ONLY)
Abs Immature Granulocytes: 0.02 10*3/uL (ref 0.00–0.07)
Basophils Absolute: 0 10*3/uL (ref 0.0–0.1)
Basophils Relative: 0 %
Eosinophils Absolute: 0.1 10*3/uL (ref 0.0–0.5)
Eosinophils Relative: 1 %
HCT: 21.3 % — ABNORMAL LOW (ref 36.0–46.0)
Hemoglobin: 7.1 g/dL — ABNORMAL LOW (ref 12.0–15.0)
Immature Granulocytes: 0 %
Lymphocytes Relative: 78 %
Lymphs Abs: 5.2 10*3/uL — ABNORMAL HIGH (ref 0.7–4.0)
MCH: 28.4 pg (ref 26.0–34.0)
MCHC: 33.3 g/dL (ref 30.0–36.0)
MCV: 85.2 fL (ref 80.0–100.0)
Monocytes Absolute: 0.4 10*3/uL (ref 0.1–1.0)
Monocytes Relative: 5 %
Neutro Abs: 1.1 10*3/uL — ABNORMAL LOW (ref 1.7–7.7)
Neutrophils Relative %: 16 %
Platelet Count: 39 10*3/uL — ABNORMAL LOW (ref 150–400)
RBC: 2.5 MIL/uL — ABNORMAL LOW (ref 3.87–5.11)
RDW: 14.5 % (ref 11.5–15.5)
WBC Count: 6.7 10*3/uL (ref 4.0–10.5)
nRBC: 0 % (ref 0.0–0.2)

## 2018-08-02 LAB — PREPARE RBC (CROSSMATCH)

## 2018-08-03 ENCOUNTER — Inpatient Hospital Stay: Payer: BLUE CROSS/BLUE SHIELD

## 2018-08-03 DIAGNOSIS — B1910 Unspecified viral hepatitis B without hepatic coma: Secondary | ICD-10-CM | POA: Diagnosis not present

## 2018-08-03 DIAGNOSIS — D801 Nonfamilial hypogammaglobulinemia: Secondary | ICD-10-CM | POA: Diagnosis not present

## 2018-08-03 DIAGNOSIS — D649 Anemia, unspecified: Secondary | ICD-10-CM

## 2018-08-03 DIAGNOSIS — D709 Neutropenia, unspecified: Secondary | ICD-10-CM | POA: Diagnosis not present

## 2018-08-03 DIAGNOSIS — D696 Thrombocytopenia, unspecified: Secondary | ICD-10-CM | POA: Diagnosis not present

## 2018-08-03 DIAGNOSIS — M2550 Pain in unspecified joint: Secondary | ICD-10-CM | POA: Diagnosis not present

## 2018-08-03 DIAGNOSIS — C9190 Lymphoid leukemia, unspecified not having achieved remission: Secondary | ICD-10-CM | POA: Diagnosis not present

## 2018-08-03 DIAGNOSIS — Z9221 Personal history of antineoplastic chemotherapy: Secondary | ICD-10-CM | POA: Diagnosis not present

## 2018-08-03 DIAGNOSIS — E039 Hypothyroidism, unspecified: Secondary | ICD-10-CM | POA: Diagnosis not present

## 2018-08-03 DIAGNOSIS — C9110 Chronic lymphocytic leukemia of B-cell type not having achieved remission: Secondary | ICD-10-CM

## 2018-08-03 DIAGNOSIS — R0602 Shortness of breath: Secondary | ICD-10-CM | POA: Diagnosis not present

## 2018-08-03 MED ORDER — SODIUM CHLORIDE 0.9% IV SOLUTION
250.0000 mL | Freq: Once | INTRAVENOUS | Status: AC
  Administered 2018-08-03: 250 mL via INTRAVENOUS
  Filled 2018-08-03: qty 250

## 2018-08-03 MED ORDER — ACETAMINOPHEN 325 MG PO TABS
ORAL_TABLET | ORAL | Status: AC
  Filled 2018-08-03: qty 2

## 2018-08-03 MED ORDER — ACETAMINOPHEN 325 MG PO TABS
650.0000 mg | ORAL_TABLET | Freq: Once | ORAL | Status: AC
  Administered 2018-08-03: 650 mg via ORAL

## 2018-08-03 MED ORDER — DIPHENHYDRAMINE HCL 25 MG PO CAPS
25.0000 mg | ORAL_CAPSULE | Freq: Once | ORAL | Status: AC
  Administered 2018-08-03: 25 mg via ORAL

## 2018-08-03 MED ORDER — DIPHENHYDRAMINE HCL 25 MG PO CAPS
ORAL_CAPSULE | ORAL | Status: AC
  Filled 2018-08-03: qty 1

## 2018-08-03 NOTE — Patient Instructions (Signed)
Blood Transfusion, Adult, Care After This sheet gives you information about how to care for yourself after your procedure. Your doctor may also give you more specific instructions. If you have problems or questions, contact your doctor. Follow these instructions at home:   Take over-the-counter and prescription medicines only as told by your doctor.  Go back to your normal activities as told by your doctor.  Follow instructions from your doctor about how to take care of the area where an IV tube was put into your vein (insertion site). Make sure you: ? Wash your hands with soap and water before you change your bandage (dressing). If there is no soap and water, use hand sanitizer. ? Change your bandage as told by your doctor.  Check your IV insertion site every day for signs of infection. Check for: ? More redness, swelling, or pain. ? More fluid or blood. ? Warmth. ? Pus or a bad smell. Contact a doctor if:  You have more redness, swelling, or pain around the IV insertion site.  You have more fluid or blood coming from the IV insertion site.  Your IV insertion site feels warm to the touch.  You have pus or a bad smell coming from the IV insertion site.  Your pee (urine) turns pink, red, or brown.  You feel weak after doing your normal activities. Get help right away if:  You have signs of a serious allergic or body defense (immune) system reaction, including: ? Itchiness. ? Hives. ? Trouble breathing. ? Anxiety. ? Pain in your chest or lower back. ? Fever, flushing, and chills. ? Fast pulse. ? Rash. ? Watery poop (diarrhea). ? Throwing up (vomiting). ? Dark pee. ? Serious headache. ? Dizziness. ? Stiff neck. ? Yellow color in your face or the white parts of your eyes (jaundice). Summary  After a blood transfusion, return to your normal activities as told by your doctor.  Every day, check for signs of infection where the IV tube was put into your vein.  Some  signs of infection are warm skin, more redness and pain, more fluid or blood, and pus or a bad smell where the needle went in.  Contact your doctor if you feel weak or have any unusual symptoms. This information is not intended to replace advice given to you by your health care provider. Make sure you discuss any questions you have with your health care provider. Document Released: 07/31/2014 Document Revised: 03/03/2016 Document Reviewed: 03/03/2016 Elsevier Interactive Patient Education  2019 Elsevier Inc.  

## 2018-08-05 ENCOUNTER — Ambulatory Visit (HOSPITAL_COMMUNITY)
Admission: RE | Admit: 2018-08-05 | Discharge: 2018-08-05 | Disposition: A | Discharge status: Home / Self Care | Payer: BLUE CROSS/BLUE SHIELD | Source: Ambulatory Visit | Attending: Nurse Practitioner | Admitting: Nurse Practitioner

## 2018-08-05 ENCOUNTER — Other Ambulatory Visit: Payer: BLUE CROSS/BLUE SHIELD

## 2018-08-05 ENCOUNTER — Other Ambulatory Visit: Payer: Self-pay

## 2018-08-05 ENCOUNTER — Encounter (HOSPITAL_COMMUNITY): Payer: Self-pay

## 2018-08-05 DIAGNOSIS — F419 Anxiety disorder, unspecified: Secondary | ICD-10-CM | POA: Diagnosis not present

## 2018-08-05 DIAGNOSIS — C9110 Chronic lymphocytic leukemia of B-cell type not having achieved remission: Secondary | ICD-10-CM | POA: Insufficient documentation

## 2018-08-05 DIAGNOSIS — I341 Nonrheumatic mitral (valve) prolapse: Secondary | ICD-10-CM | POA: Diagnosis not present

## 2018-08-05 DIAGNOSIS — Z79899 Other long term (current) drug therapy: Secondary | ICD-10-CM | POA: Diagnosis not present

## 2018-08-05 DIAGNOSIS — Z85820 Personal history of malignant melanoma of skin: Secondary | ICD-10-CM | POA: Insufficient documentation

## 2018-08-05 DIAGNOSIS — F329 Major depressive disorder, single episode, unspecified: Secondary | ICD-10-CM | POA: Insufficient documentation

## 2018-08-05 DIAGNOSIS — E039 Hypothyroidism, unspecified: Secondary | ICD-10-CM | POA: Diagnosis not present

## 2018-08-05 DIAGNOSIS — Z793 Long term (current) use of hormonal contraceptives: Secondary | ICD-10-CM | POA: Insufficient documentation

## 2018-08-05 DIAGNOSIS — Z79890 Hormone replacement therapy (postmenopausal): Secondary | ICD-10-CM | POA: Diagnosis not present

## 2018-08-05 DIAGNOSIS — D696 Thrombocytopenia, unspecified: Secondary | ICD-10-CM | POA: Diagnosis not present

## 2018-08-05 LAB — TYPE AND SCREEN
ABO/RH(D): O POS
Antibody Screen: NEGATIVE
Unit division: 0
Unit division: 0

## 2018-08-05 LAB — CBC WITH DIFFERENTIAL/PLATELET
Abs Immature Granulocytes: 0 10*3/uL (ref 0.00–0.07)
Basophils Absolute: 0 10*3/uL (ref 0.0–0.1)
Basophils Relative: 0 %
Eosinophils Absolute: 0.1 10*3/uL (ref 0.0–0.5)
Eosinophils Relative: 1 %
HCT: 30.3 % — ABNORMAL LOW (ref 36.0–46.0)
Hemoglobin: 10 g/dL — ABNORMAL LOW (ref 12.0–15.0)
Immature Granulocytes: 0 %
Lymphocytes Relative: 77 %
Lymphs Abs: 6.4 10*3/uL — ABNORMAL HIGH (ref 0.7–4.0)
MCH: 29.8 pg (ref 26.0–34.0)
MCHC: 33 g/dL (ref 30.0–36.0)
MCV: 90.2 fL (ref 80.0–100.0)
Monocytes Absolute: 1 10*3/uL (ref 0.1–1.0)
Monocytes Relative: 11 %
Neutro Abs: 0.9 10*3/uL — ABNORMAL LOW (ref 1.7–7.7)
Neutrophils Relative %: 11 %
Platelets: 41 10*3/uL — ABNORMAL LOW (ref 150–400)
RBC: 3.36 MIL/uL — ABNORMAL LOW (ref 3.87–5.11)
RDW: 14.8 % (ref 11.5–15.5)
WBC: 8.4 10*3/uL (ref 4.0–10.5)
nRBC: 0 % (ref 0.0–0.2)

## 2018-08-05 LAB — BPAM RBC
Blood Product Expiration Date: 202002102359
Blood Product Expiration Date: 202002102359
ISSUE DATE / TIME: 202001110902
ISSUE DATE / TIME: 202001110902
Unit Type and Rh: 5100
Unit Type and Rh: 5100

## 2018-08-05 LAB — PROTIME-INR
INR: 1.33
Prothrombin Time: 16.4 seconds — ABNORMAL HIGH (ref 11.4–15.2)

## 2018-08-05 MED ORDER — MIDAZOLAM HCL 2 MG/2ML IJ SOLN
INTRAMUSCULAR | Status: AC | PRN
  Administered 2018-08-05 (×3): 1 mg via INTRAVENOUS

## 2018-08-05 MED ORDER — NALOXONE HCL 0.4 MG/ML IJ SOLN
INTRAMUSCULAR | Status: AC
  Filled 2018-08-05: qty 1

## 2018-08-05 MED ORDER — MIDAZOLAM HCL 2 MG/2ML IJ SOLN
INTRAMUSCULAR | Status: AC
  Filled 2018-08-05: qty 4

## 2018-08-05 MED ORDER — FENTANYL CITRATE (PF) 100 MCG/2ML IJ SOLN
INTRAMUSCULAR | Status: AC
  Filled 2018-08-05: qty 4

## 2018-08-05 MED ORDER — FLUMAZENIL 0.5 MG/5ML IV SOLN
INTRAVENOUS | Status: AC
  Filled 2018-08-05: qty 5

## 2018-08-05 MED ORDER — SODIUM CHLORIDE 0.9 % IV SOLN
INTRAVENOUS | Status: DC
  Administered 2018-08-05: 08:00:00 via INTRAVENOUS

## 2018-08-05 MED ORDER — LIDOCAINE HCL (PF) 1 % IJ SOLN
INTRAMUSCULAR | Status: AC | PRN
  Administered 2018-08-05: 10 mL via INTRADERMAL

## 2018-08-05 MED ORDER — FENTANYL CITRATE (PF) 100 MCG/2ML IJ SOLN
INTRAMUSCULAR | Status: AC | PRN
  Administered 2018-08-05 (×2): 50 ug via INTRAVENOUS

## 2018-08-05 NOTE — H&P (Signed)
Referring Physician(s): Sherrill,B  Supervising Physician: Daryll Brod  Patient Status:  [facility] OP  Chief Complaint:  "I'm having another bone marrow biopsy"  Subjective: Patient familiar to IR service from prior left inguinal lymph node biopsy on 09/18/2017 and bone marrow biopsy on 02/26/2018.  She has a history of CLL and now with symptomatic anemia and progressive thrombocytopenia.  She presents again today for restaging bone marrow biopsy for further evaluation.  She currently denies fever, headache, chest pain, dyspnea, cough, abdominal/back pain, nausea, vomiting or bleeding.  Past Medical History:  Diagnosis Date  . Anxiety   . Cancer ([place])    CLL  . Degenerative disk disease    Neck--chronic pain  . Depression   . Fibroids    Uterine  . 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 Hypothyroidism   . Lymphocytosis   . Mitral valve prolapse   . Mitral valve prolapse   . Thyroid disease    Hypothyroid   Past Surgical History:  Procedure Laterality Date  . BREAST BIOPSY Left 2000  . CESAREAN SECTION  1996  . MELANOMA EXCISION WITH SENTINEL LYMPH NODE BIOPSY Left 10/01/2017   Procedure: EXCISIONAL BIOPSY OF LEFT INGUINAL LYMPH NODE;  Surgeon: Greer Pickerel, MD;  Location: [facility] ORS;  Service: General;  Laterality: Left;  . REVISION OF ABDOMINAL SCAR    . TONSILLECTOMY     age 32      Allergies: Patient has no known allergies.  Medications: Prior to Admission medications   Medication Sig Start Date End Date Taking? Authorizing Provider  acetaminophen (TYLENOL) 325 MG tablet Take 650 mg by mouth every 6 (six) hours as needed.   Yes [provider]  ALPRAZolam (XANAX) 0.25 MG tablet Take 0.25-0.5 mg by mouth daily as needed. For anxiety 09/16/17  Yes [provider]  lamoTRIgine (LAMICTAL) 200 MG tablet Take 1 tablet (200 mg total) by mouth 2 (two) times daily. 06/14/18  Yes Cottle, Billey Co., MD  levothyroxine  (SYNTHROID, LEVOTHROID) 100 MCG tablet Take 100 mcg by mouth daily.   Yes [provider]  lithium carbonate (ESKALITH) 450 MG CR tablet Take 900 mg by mouth at bedtime. 09/02/17  Yes [provider]  vortioxetine HBr (TRINTELLIX) 20 MG TABS Take 20 mg by mouth daily.    Yes [provider]  zolpidem (AMBIEN) 10 MG tablet TAKE 1/2 TO 1 TABLET EVERY DAY AT BEDTIME AS NEEDED FOR INSOMNIA 05/07/18  Yes Cottle, Billey Co., MD  azithromycin [facility]) 250 MG tablet Take #2 tabs today, then one daily till finished Patient not taking: Reported on 07/29/2018 07/09/18   Ladell Pier, MD  ciprofloxacin (CIPRO) 500 MG tablet Take 1 tablet (500 mg total) by mouth 2 (two) times daily. Start for sign/symptom of infection or temp > 101 Patient not taking: Reported on 07/29/2018 07/15/18   Ladell Pier, MD  HYDROcodone-acetaminophen (NORCO/VICODIN) 5-325 MG tablet Take 1 tablet by mouth every 4 (four) hours as needed for moderate pain. Patient not taking: Reported on 07/29/2018 07/04/18   Ladell Pier, MD  levonorgestrel [facility]) 20 MCG/24HR IUD 1 each by Intrauterine route once.     [provider]  oxyCODONE-acetaminophen (PERCOCET/ROXICET) 5-325 MG tablet Take 1 tablet by mouth every 6 (six) hours as needed for severe pain. Patient not taking: Reported on 07/29/2018 07/05/18   Owens Shark, NP  traMADol (ULTRAM) 50 MG tablet TAKE 1 TABLET BY MOUTH EVERY  8 HOURS AS NEEDED Patient not taking: Reported on 07/29/2018 07/03/18   Ladell Pier, MD     Vital Signs: BP (!) 109/55   Pulse 80   Temp 98.5 F (36.9 C) (Oral)   Resp 18   SpO2 100%   Physical Exam awake, alert.  Chest clear to auscultation bilaterally.  Heart with regular rate and rhythm.  Abdomen soft, positive bowel sounds, nontender.  No lower extremity edema.  Imaging: No results found.  Labs:  CBC: Recent Labs    07/12/18 1114 07/16/18 0755 07/29/18 1346 08/02/18 1413  WBC 11.0* 10.7* 7.9  6.7  HGB 8.7* 8.2* 8.0* 7.1*  HCT 27.0* 25.5* 24.5* 21.3*  PLT 101* 87* 27* 39*    COAGS: Recent Labs    09/18/17 1118 02/26/18 0724 08/05/18 0746  INR 1.33 1.31 1.33    BMP: Recent Labs    06/25/18 0902 07/05/18 0841 07/09/18 0909 07/29/18 1346  NA 142 136 144 139  K 4.4 4.0 4.3 4.1  CL 107 103 109 106  CO2 _0 GLUCOSE 97 156* 103* 100*  BUN _1 CALCIUM 9.7 9.3 9.2 9.6  CREATININE 0.83 0.83 0.73 0.88  GFRNONAA >60 >60 >60 >60  GFRAA >60 >60 >60 >60    LIVER FUNCTION TESTS: Recent Labs    06/25/18 0902 07/05/18 0841 07/09/18 0909 07/29/18 1346  BILITOT 0.4 0.5 0.3 0.6  AST 12* 12* 15 20  ALT _2 ALKPHOS 78 71 78 83  PROT 5.8* 5.9* 6.0* 6.1*  ALBUMIN 3.9 3.6 3.6 4.3    Assessment and Plan: Pt with history of CLL ; now with symptomatic anemia and progressive thrombocytopenia.  She presents  today for restaging CT guided  bone marrow biopsy for further evaluation.Risks and benefits discussed with the patient/spouse including, but not limited to bleeding, infection, damage to adjacent structures or low yield requiring additional tests.  All of the patient's questions were answered, patient is agreeable to proceed. Consent signed and in chart.     Electronically Signed: D. Rowe Robert, PA-C 08/05/2018, 8:19 AM   I spent a total of 20 minutes at the the patient's bedside AND on the patient's hospital floor or unit, greater than 50% of which was counseling/coordinating care for CT guided bone marrow biopsy

## 2018-08-05 NOTE — Discharge Instructions (Signed)
Moderate Conscious Sedation, Adult, Care After °These instructions provide you with information about caring for yourself after your procedure. Your health care provider may also give you more specific instructions. Your treatment has been planned according to current medical practices, but problems sometimes occur. Call your health care provider if you have any problems or questions after your procedure. °What can I expect after the procedure? °After your procedure, it is common: °· To feel sleepy for several hours. °· To feel clumsy and have poor balance for several hours. °· To have poor judgment for several hours. °· To vomit if you eat too soon. °Follow these instructions at home: °For at least 24 hours after the procedure: ° °· Do not: °? Participate in activities where you could fall or become injured. °? Drive. °? Use heavy machinery. °? Drink alcohol. °? Take sleeping pills or medicines that cause drowsiness. °? Make important decisions or sign legal documents. °? Take care of children on your own. °· Rest. °Eating and drinking °· Follow the diet recommended by your health care provider. °· If you vomit: °? Drink water, juice, or soup when you can drink without vomiting. °? Make sure you have little or no nausea before eating solid foods. °General instructions °· Have a responsible adult stay with you until you are awake and alert. °· Take over-the-counter and prescription medicines only as told by your health care provider. °· If you smoke, do not smoke without supervision. °· Keep all follow-up visits as told by your health care provider. This is important. °Contact a health care provider if: °· You keep feeling nauseous or you keep vomiting. °· You feel light-headed. °· You develop a rash. °· You have a fever. °Get help right away if: °· You have trouble breathing. °This information is not intended to replace advice given to you by your health care provider. Make sure you discuss any questions you have  with your health care provider. °Document Released: 04/30/2013 Document Revised: 12/13/2015 Document Reviewed: 10/30/2015 °Elsevier Interactive Patient Education © 2019 [employer] Inc[employer] °Bone Marrow Aspiration and Bone Marrow Biopsy, Adult, Care After °This sheet gives you information about how to care for yourself after your procedure. Your health care provider may also give you more specific instructions. If you have problems or questions, contact your health care provider. °What can I expect after the procedure? °After the procedure, it is common to have: °· Mild pain and tenderness. °· Swelling. °· Bruising. °Follow these instructions at home: °Puncture site care ° °  ° °· Follow instructions from your health care provider about how to take care of the puncture site. Make sure you: °? Wash your hands with soap and water before you change your bandage (dressing). If soap and water are not available, use hand sanitizer. °? Change your dressing as told by your health care provider. °· Check your puncture site every day for signs of infection. Check for: °? More redness, swelling, or pain. °? More fluid or blood. °? Warmth. °? Pus or a bad smell. °General instructions °· Take over-the-counter and prescription medicines only as told by your health care provider. °· Do not take baths, swim, or use a hot tub until your health care provider approves. Ask if you can take a shower or have a sponge bath. °· Return to your normal activities as told by your health care provider. Ask your health care provider what activities are safe for you. °· Do not drive for 24 hours if you were given   a medicine to help you relax (sedative) during your procedure. °· Keep all follow-up visits as told by your health care provider. This is important. °Contact a health care provider if: °· Your pain is not controlled with medicine. °Get help right away if: °· You have a fever. °· You have more redness, swelling, or pain around the puncture  site. °· You have more fluid or blood coming from the puncture site. °· Your puncture site feels warm to the touch. °· You have pus or a bad smell coming from the puncture site. °These symptoms may represent a serious problem that is an emergency. Do not wait to see if the symptoms will go away. Get medical help right away. Call your local emergency services (911 in the [place] Do not drive yourself to the hospital. °Summary °· After the procedure, it is common to have mild pain, tenderness, swelling, and bruising. °· Follow instructions from your health care provider about how to take care of the puncture site. °· Get help right away if you have any symptoms of infection or if you have more blood or fluid coming from the puncture site. °This information is not intended to replace advice given to you by your health care provider. Make sure you discuss any questions you have with your health care provider. °Document Released: 01/27/2005 Document Revised: 10/23/2017 Document Reviewed: 12/22/2015 °Elsevier Interactive Patient Education © 2019 Elsevier Inc. ° °

## 2018-08-05 NOTE — Procedures (Signed)
CLL  S/p Rt iliac BM asp and core bx  No comp Stable ebl min Path pending Full report in pacs

## 2018-08-06 ENCOUNTER — Other Ambulatory Visit: Payer: Self-pay | Admitting: Internal Medicine

## 2018-08-06 DIAGNOSIS — Z0000 Encounter for general adult medical examination without abnormal findings: Secondary | ICD-10-CM | POA: Diagnosis not present

## 2018-08-06 DIAGNOSIS — E039 Hypothyroidism, unspecified: Secondary | ICD-10-CM | POA: Diagnosis not present

## 2018-08-06 DIAGNOSIS — Z1231 Encounter for screening mammogram for malignant neoplasm of breast: Secondary | ICD-10-CM

## 2018-08-06 DIAGNOSIS — Z23 Encounter for immunization: Secondary | ICD-10-CM | POA: Diagnosis not present

## 2018-08-06 DIAGNOSIS — B009 Herpesviral infection, unspecified: Secondary | ICD-10-CM | POA: Diagnosis not present

## 2018-08-06 DIAGNOSIS — C9110 Chronic lymphocytic leukemia of B-cell type not having achieved remission: Secondary | ICD-10-CM | POA: Diagnosis not present

## 2018-08-06 DIAGNOSIS — Z13820 Encounter for screening for osteoporosis: Secondary | ICD-10-CM

## 2018-08-07 ENCOUNTER — Encounter: Payer: Self-pay | Admitting: Psychiatry

## 2018-08-07 ENCOUNTER — Ambulatory Visit: Payer: BLUE CROSS/BLUE SHIELD | Admitting: Psychiatry

## 2018-08-07 ENCOUNTER — Telehealth: Payer: Self-pay | Admitting: *Deleted

## 2018-08-07 DIAGNOSIS — F331 Major depressive disorder, recurrent, moderate: Secondary | ICD-10-CM

## 2018-08-07 DIAGNOSIS — F4310 Post-traumatic stress disorder, unspecified: Secondary | ICD-10-CM

## 2018-08-07 NOTE — Telephone Encounter (Signed)
Spoke with pt, and informed pt to keep office visit with Dr. Benay Spice as scheduled for 08/14/18 - MD has no availability on 08/16/18.   Informed pt that lab appt will be cancelled.  Asked pt to bring a copy of labs done at [facility] 08/13/18 to her visit with Dr. Benay Spice.  Pt voiced understanding.

## 2018-08-07 NOTE — Progress Notes (Signed)
Vi B[place] [id number] 1961/06/03 58 y.o.  Subjective:   Patient ID:  Meagan Walsh is a 58 y.o. (DOB [birth year]) female.  Chief Complaint:  Chief Complaint  Patient presents with  . Follow-up    Medication Management    HPI  Last seen in July [place] presents to the office today for follow-up of bipolar depression.  Has had leukemia CLL and transfusions.  Started chemo with SE and can't tolerate.  Onc [name].  Closely monitored.  Referral to [facility] soon.  In spite of stress has not been depressed.  Patient reports stable mood and denies depressed or irritable moods.  Patient denies any recent difficulty with anxiety.  Patient denies difficulty with sleep initiation or maintenance. Denies appetite disturbance.  Patient reports that energy and motivation have been good.  Patient denies any difficulty with concentration.  Patient denies any suicidal ideation.  Lost 25# and regained 4#.  2 severe episodes depression in life that last a full year.  Multiple failed prior medication trials include but are not limited to the following: Viibryd, Abilify, Vraylar, Rexulti 0.5 mg caused restlessness, duloxetine, Viibryd, Trintellix 20 mg failed until lithium was added  Review of Systems:  Review of Systems  Neurological: Negative for tremors and weakness.  Psychiatric/Behavioral: Negative for agitation, behavioral problems, confusion, decreased concentration, dysphoric mood, hallucinations, self-injury, sleep disturbance and suicidal ideas. The patient is not nervous/anxious and is not hyperactive.     Medications: I have reviewed the patient's current medications.  Current Outpatient Medications  Medication Sig Dispense Refill  . acetaminophen (TYLENOL) 325 MG tablet Take 650 mg by mouth every 6 (six) hours as needed.    . ALPRAZolam (XANAX) 0.25 MG tablet Take 0.25-0.5 mg by mouth daily as needed. For anxiety  1  . lamoTRIgine (LAMICTAL) 200 MG tablet Take 1  tablet (200 mg total) by mouth 2 (two) times daily. 180 tablet 1  . levonorgestrel (MIRENA) 20 MCG/24HR IUD 1 each by Intrauterine route once.     [name] levothyroxine (SYNTHROID, LEVOTHROID) 100 MCG tablet Take 100 mcg by mouth daily.    [name] lithium carbonate (ESKALITH) 450 MG CR tablet Take 900 mg by mouth at bedtime.  1  . vortioxetine HBr (TRINTELLIX) 20 MG TABS Take 20 mg by mouth daily.     [name] zolpidem (AMBIEN) 10 MG tablet TAKE 1/2 TO 1 TABLET EVERY DAY AT BEDTIME AS NEEDED FOR INSOMNIA 30 tablet 4  . azithromycin (ZITHROMAX) 250 MG tablet Take #2 tabs today, then one daily till finished (Patient not taking: Reported on 07/29/2018) 6 each 0  . ciprofloxacin (CIPRO) 500 MG tablet Take 1 tablet (500 mg total) by mouth 2 (two) times daily. Start for sign/symptom of infection or temp > 101 (Patient not taking: Reported on 07/29/2018) 10 tablet 0  . HYDROcodone-acetaminophen (NORCO/VICODIN) 5-325 MG tablet Take 1 tablet by mouth every 4 (four) hours as needed for moderate pain. (Patient not taking: Reported on 07/29/2018) 30 tablet 0  . oxyCODONE-acetaminophen (PERCOCET/ROXICET) 5-325 MG tablet Take 1 tablet by mouth every 6 (six) hours as needed for severe pain. (Patient not taking: Reported on 07/29/2018) 30 tablet 0  . traMADol (ULTRAM) 50 MG tablet TAKE 1 TABLET BY MOUTH EVERY 8 HOURS AS NEEDED (Patient not taking: Reported on 07/29/2018) 20 tablet 0   No current facility-administered medications for this visit.     Medication Side Effects: None  Allergies: No Known Allergies  Past Medical History:  Diagnosis Date  . Anxiety   .  Cancer ([place])    CLL  . Degenerative disk disease    Neck--chronic pain  . Depression   . Fibroids    Uterine  . 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 Hypothyroidism   . Lymphocytosis   . Mitral valve prolapse   . Mitral valve prolapse   . Thyroid disease    Hypothyroid    Family History  Problem Relation Age of Onset  . Breast  cancer Mother     Social History   Socioeconomic History  . Marital status: Married    Spouse name: Not on file  . Number of children: Not on file  . Years of education: Not on file  . Highest education level: Not on file  Occupational History  . Not on file  Social Needs  . Financial resource strain: Not on file  . Food insecurity:    Worry: Not on file    Inability: Not on file  . Transportation needs:    Medical: Not on file    Non-medical: Not on file  Tobacco Use  . Smoking status: Never Smoker  . Smokeless tobacco: Never Used  Substance and Sexual Activity  . Alcohol use: Yes    Frequency: Never    Comment: 2 glasses of wine 2 times per week   . Drug use: No  . Sexual activity: Not on file  Lifestyle  . Physical activity:    Days per week: Not on file    Minutes per session: Not on file  . Stress: Not on file  Relationships  . Social connections:    Talks on phone: Not on file    Gets together: Not on file    Attends religious service: Not on file    Active member of club or organization: Not on file    Attends meetings of clubs or organizations: Not on file    Relationship status: Not on file  . Intimate partner violence:    Fear of current or ex partner: Not on file    Emotionally abused: Not on file    Physically abused: Not on file    Forced sexual activity: Not on file  Other Topics Concern  . Not on file  Social History Narrative   Pt lives in 2 story home with her husband   Has 3 adult children   Masters degree   Works at [employer]    Past Medical History, Surgical history, Social history, and Family history were reviewed and updated as appropriate.   Please see review of systems for further details on the patient's review from today.   Objective:   Physical Exam:  There were no vitals taken for this visit.  Physical Exam Constitutional:      General: She is not in acute distress.    Appearance: She is well-developed.   Musculoskeletal:        General: No deformity.  Neurological:     Mental Status: She is alert and oriented to person, place, and time.     Motor: No tremor.     Coordination: Coordination normal.     Gait: Gait normal.  Psychiatric:        Attention and Perception: Attention and perception normal.        Mood and Affect: Mood is not anxious or depressed. Affect is not labile, blunt, angry or inappropriate.        Speech: Speech normal.  Behavior: Behavior normal.        Thought Content: Thought content normal. Thought content does not include homicidal or suicidal ideation. Thought content does not include homicidal or suicidal plan.        Cognition and Memory: Cognition normal.        Judgment: Judgment normal.     Comments: Insight intact. No auditory or visual hallucinations. No delusions.      Lab Review:     Component Value Date/Time   NA 139 07/29/2018 1346   K 4.1 07/29/2018 1346   CL 106 07/29/2018 1346   CO2 27 07/29/2018 1346   GLUCOSE 100 (H) 07/29/2018 1346   BUN 17 07/29/2018 1346   CREATININE 0.88 07/29/2018 1346   CALCIUM 9.6 07/29/2018 1346   PROT 6.1 (L) 07/29/2018 1346   ALBUMIN 4.3 07/29/2018 1346   AST 20 07/29/2018 1346   ALT 25 07/29/2018 1346   ALKPHOS 83 07/29/2018 1346   BILITOT 0.6 07/29/2018 1346   GFRNONAA >60 07/29/2018 1346   GFRAA >60 07/29/2018 1346       Component Value Date/Time   WBC 8.4 08/05/2018 0746   RBC 3.36 (L) 08/05/2018 0746   HGB 10.0 (L) 08/05/2018 0746   HGB 7.1 (L) 08/02/2018 1413   HGB 14.0 06/27/2016 1038   HCT 30.3 (L) 08/05/2018 0746   HCT 42.0 06/27/2016 1038   PLT 41 (L) 08/05/2018 0746   PLT 39 (L) 08/02/2018 1413   PLT 205 06/27/2016 1038   MCV 90.2 08/05/2018 0746   MCV 92.9 06/27/2016 1038   MCH 29.8 08/05/2018 0746   MCHC 33.0 08/05/2018 0746   RDW 14.8 08/05/2018 0746   RDW 12.2 06/27/2016 1038   LYMPHSABS 6.4 (H) 08/05/2018 0746   LYMPHSABS 21.5 (H) 06/27/2016 1038   MONOABS 1.0  08/05/2018 0746   MONOABS 0.5 06/27/2016 1038   EOSABS 0.1 08/05/2018 0746   EOSABS 0.2 06/27/2016 1038   BASOSABS 0.0 08/05/2018 0746   BASOSABS 0.1 06/27/2016 1038    No results found for: POCLITH, LITHIUM   No results found for: PHENYTOIN, PHENOBARB, VALPROATE, CBMZ   .res Assessment: Plan:    Major depressive disorder, recurrent episode, moderate ([place]) - Plan: Lithium level   Cristie has had history of 2 very severe prolonged depressions that failed to respond to multiple psychiatric medications but then responded to lithium potentiation.  She has been through the past 2 winters with no difficulty.  She has a very strong seasonal pattern.  She wonders about possibly coming off the lithium because the last 2 winters have been unremarkable.  I recommend that she go through 1 more winter at least on the lithium before considering reduction.  Check lithium level.  Counseled patient regarding potential benefits, risks, and side effects of lithium to include potential risk of lithium affecting thyroid and renal function.  Discussed need for periodic lab monitoring to determine drug level and to assess for potential adverse effects.  Counseled patient regarding signs and symptoms of lithium toxicity and advised that they notify office immediately or seek urgent medical attention if experiencing these signs and symptoms.  Patient advised to contact office with any questions or concerns.  FU 4 mos  Lynder Parents, MD, DFAPA   Please see After Visit Summary for patient specific instructions.  Future Appointments  Date Time Provider [place]  08/14/2018 10:30 AM Ladell Pier, MD [facility] None  09/24/2018  9:30 AM [facility] MM 3 [facility]  09/24/2018 10:00 AM  [facility] DX DEXA 1 [facility]    Orders Placed This Encounter  Procedures  . Lithium level      -------------------------------

## 2018-08-09 ENCOUNTER — Telehealth: Payer: Self-pay | Admitting: Pharmacist

## 2018-08-09 ENCOUNTER — Telehealth: Payer: Self-pay

## 2018-08-09 NOTE — Telephone Encounter (Signed)
Oral Oncology Patient Advocate Encounter  Prior Authorization for Calquence has been approved.    PA# [id number] Effective dates: 08/09/18 through 08/08/19  Oral Oncology Clinic will continue to follow.   [place] Patient [place] Phone [phone] Fax [phone]

## 2018-08-09 NOTE — Telephone Encounter (Signed)
Oral Oncology Patient Advocate Encounter  Received notification from [facility] that prior authorization for Calquence is required.  PA submitted on CoverMyMeds Key [id number] Status is pending  Oral Oncology Clinic will continue to follow.  [place] Patient [place] Phone [phone] Fax [phone]

## 2018-08-09 NOTE — Telephone Encounter (Signed)
Oral Oncology Pharmacist Encounter  Received new prescription for Calquence (acalabrutinib) for the treatment of chronic lymphocytic leukemia (CLL), with intolerable side effects from treatment with ibrutinib, planned duration planned duration until disease progression or unacceptable toxicity.  Original diagnosis in August 2010, on observation until initiation of treatment in Sept 2019 secondary to severe anemia Patient was treated with ibrutinib 04/03/18 - 07/03/18, with several dose reductions and delays due to rash and severe arthralgias  Patient is now under evaluation to change treatment for CLL to Calquence as patient is not resistance to BTK-inhibition. Plan is to start after discussion at office visit 08/14/2018 with Dr. Benay Spice after patient has follow-up at [facility] on 08/13/18.  Labs from [place] assessed Pltc=41k, secondary to bone marrow involvement of CLL All available studies for CLL treatment with patient excluded for pltc<50k, will discuss with MD It has been shown that [place]s has fewer anti-platelet effects than Imbruvica, however the effects are still present.  Current medication list in Epic reviewed, drug interaction between [place] and Trintellix identified:  Category C interaction: BTK inhibitors have been shown to reduce collagen-induced platelet aggregation, as well as many other pathways of reduced platelet action, these effects are less pronounced with the use of acalabrutinib vs ibrutinib. Vortioxetine is an SSRI antidepressive agent which has also been shown to decrease platelet aggregation. Patient on multiple CNS active agents, will discuss interaction with MD.  Prescription will be e-scribed to the [facility] for dispensing once obtained from MD after office visit on 08/14/2018. Insurance authorization will be obtained in the meantime to ensure quick start of medication once it is prescribed.  Oral Oncology Clinic will continue to follow for  insurance authorization, copayment issues, initial counseling and start date.  Johny Drilling, PharmD, BCPS, BCOP  08/09/2018 7:49 AM Oral Oncology Clinic [phone]

## 2018-08-13 ENCOUNTER — Ambulatory Visit
Admit: 2018-08-13 | Discharge: 2018-08-13 | Payer: PRIVATE HEALTH INSURANCE | Attending: Hematology & Oncology | Primary: Hematology & Oncology

## 2018-08-13 ENCOUNTER — Other Ambulatory Visit: Admit: 2018-08-13 | Discharge: 2018-08-13 | Payer: PRIVATE HEALTH INSURANCE

## 2018-08-13 DIAGNOSIS — F419 Anxiety disorder, unspecified: Secondary | ICD-10-CM | POA: Diagnosis not present

## 2018-08-13 DIAGNOSIS — D801 Nonfamilial hypogammaglobulinemia: Secondary | ICD-10-CM | POA: Diagnosis not present

## 2018-08-13 DIAGNOSIS — C9110 Chronic lymphocytic leukemia of B-cell type not having achieved remission: Secondary | ICD-10-CM | POA: Diagnosis not present

## 2018-08-13 DIAGNOSIS — Z803 Family history of malignant neoplasm of breast: Secondary | ICD-10-CM | POA: Diagnosis not present

## 2018-08-13 DIAGNOSIS — E039 Hypothyroidism, unspecified: Secondary | ICD-10-CM | POA: Diagnosis not present

## 2018-08-13 DIAGNOSIS — Z8042 Family history of malignant neoplasm of prostate: Secondary | ICD-10-CM | POA: Diagnosis not present

## 2018-08-13 DIAGNOSIS — F329 Major depressive disorder, single episode, unspecified: Secondary | ICD-10-CM | POA: Diagnosis not present

## 2018-08-13 DIAGNOSIS — Z6820 Body mass index (BMI) 20.0-20.9, adult: Secondary | ICD-10-CM | POA: Diagnosis not present

## 2018-08-14 ENCOUNTER — Telehealth: Payer: Self-pay | Admitting: Oncology

## 2018-08-14 ENCOUNTER — Telehealth: Payer: Self-pay | Admitting: Pharmacist

## 2018-08-14 ENCOUNTER — Telehealth: Payer: Self-pay

## 2018-08-14 ENCOUNTER — Inpatient Hospital Stay (HOSPITAL_BASED_OUTPATIENT_CLINIC_OR_DEPARTMENT_OTHER): Payer: BLUE CROSS/BLUE SHIELD | Admitting: Oncology

## 2018-08-14 ENCOUNTER — Other Ambulatory Visit: Payer: BLUE CROSS/BLUE SHIELD

## 2018-08-14 VITALS — BP 116/69 | HR 90 | Temp 98.7°F | Resp 18 | Ht 66.0 in | Wt 127.2 lb

## 2018-08-14 DIAGNOSIS — E039 Hypothyroidism, unspecified: Secondary | ICD-10-CM

## 2018-08-14 DIAGNOSIS — D801 Nonfamilial hypogammaglobulinemia: Secondary | ICD-10-CM | POA: Diagnosis not present

## 2018-08-14 DIAGNOSIS — B1910 Unspecified viral hepatitis B without hepatic coma: Secondary | ICD-10-CM | POA: Diagnosis not present

## 2018-08-14 DIAGNOSIS — Z9221 Personal history of antineoplastic chemotherapy: Secondary | ICD-10-CM

## 2018-08-14 DIAGNOSIS — R0602 Shortness of breath: Secondary | ICD-10-CM | POA: Diagnosis not present

## 2018-08-14 DIAGNOSIS — D649 Anemia, unspecified: Secondary | ICD-10-CM | POA: Diagnosis not present

## 2018-08-14 DIAGNOSIS — C9110 Chronic lymphocytic leukemia of B-cell type not having achieved remission: Secondary | ICD-10-CM

## 2018-08-14 DIAGNOSIS — C9190 Lymphoid leukemia, unspecified not having achieved remission: Secondary | ICD-10-CM | POA: Diagnosis not present

## 2018-08-14 DIAGNOSIS — D709 Neutropenia, unspecified: Secondary | ICD-10-CM

## 2018-08-14 DIAGNOSIS — D696 Thrombocytopenia, unspecified: Secondary | ICD-10-CM | POA: Diagnosis not present

## 2018-08-14 DIAGNOSIS — M2550 Pain in unspecified joint: Secondary | ICD-10-CM | POA: Diagnosis not present

## 2018-08-14 MED ORDER — ACALABRUTINIB 100 MG PO CAPS: 100.0000 mg | ORAL_CAPSULE | Freq: Two times a day (BID) | ORAL | 0 refills | Status: DC

## 2018-08-14 NOTE — Telephone Encounter (Signed)
Oral Oncology Patient Advocate Encounter  I was able to secure the patient a copay card that will keep her out of pocket cost for Calquence $0. This information is as follows and has been shared with [place].  BIN: [id number] PCN: [id number] GRP: [id number] ID: [id number] EXP. 07/24/19  I called the patient and gave her this information and she verbalized understanding and great appreciation.  [place] Patient [place] Phone [phone] Fax [phone]

## 2018-08-14 NOTE — Telephone Encounter (Signed)
Scheduled appt per 01/22 scheduling message.  Called patient and patient is aware of her appt.

## 2018-08-14 NOTE — Telephone Encounter (Signed)
Oral Chemotherapy Pharmacist Encounter   I spoke with patient for overview of: Calquence (acalabrutinib) for the treatment of chronic lymphocytic leukemia (CLL), with intolerable side effects from treatment with ibrutinib, planned duration planned duration until disease progression or unacceptable toxicity..   Counseled patient on administration, dosing, side effects, monitoring, drug-food interactions, safe handling, storage, and disposal.  Patient will take Calquence 100mg  capsules, 1 capsule by mouth approximately 12 hours apart, with or with out food, with a glass of water.  Patient knows to avoid acid suppressing agents, if needed, may seperate use of an H2RA blocker or antacid by 2 hours from Calquence admisinstration.  Calquence start date: 08/16/2018  Adverse effects include but are not limited to: headache, diarrhea, fatigue, rash, muscle pain, bruising, decreased blood counts, and altered cardiac conduction.   We discussed increased risk of bruising and bleeding due to thrombocytopenia and medication interaction with vortioxetine. She will notify the office immediately of new, persistent headaches.   Reviewed with patient importance of keeping a medication schedule and plan for any missed doses.  Meagan Walsh voiced understanding and appreciation.   All questions answered. Medication reconciliation performed and medication/allergy list updated.  Insurance authorization has already been approved. Test claim at the pharmacy revealed copayment $100 per fill. Oral oncology patient advocate will work with patient for medication acquisition and manufacturer copayment coupon, if she is eligible.  Plan is for patient to pick-up her Calquence from the [facility] outpatient pharmacy tomorrow (08/15/18) and start on Friday, 08/16/2018.  Patient knows to call the office with questions or concerns. Oral Oncology Clinic will continue to follow.  Meagan Walsh, PharmD, BCPS, BCOP  08/14/2018    11:52 AM Oral Oncology Clinic [phone]

## 2018-08-14 NOTE — Progress Notes (Signed)
[facility] OFFICE PROGRESS NOTE   Diagnosis: CLL  INTERVAL HISTORY:   Meagan Walsh returns as scheduled.  Was last transfused with packed red blood cells on 08/03/2018.  She feels better after the red cell transfusions.  She feels she will need another red cell transfusion soon.  No fever or night sweats.  No change in the small palpable lymph node in the right groin.  Objective:  Vital signs in last 24 hours:  Blood pressure 116/69, pulse 90, temperature 98.7 F (37.1 C), temperature source Oral, resp. rate 18, height _0  (1.676 m), weight 127 lb 3.2 oz (57.7 kg), SpO2 100 %.    HEENT: No thrush Lymphatics: No cervical, supraclavicular, right axillary, or left inguinal nodes.  1 cm medial right inguinal node.  1/2-1 cm mobile medial left axillary node Resp: Lungs clear bilaterally Cardio: Regular rate and rhythm GI: No hepatosplenomegaly Vascular: No leg edema  Skin: Small ecchymoses at the upper abdomen/lower chest   Lab Results:  Lab Results  Component Value Date   WBC 8.4 08/05/2018   HGB 10.0 (L) 08/05/2018   HCT 30.3 (L) 08/05/2018   MCV 90.2 08/05/2018   PLT 41 (L) 08/05/2018   NEUTROABS 0.9 (L) 08/05/2018  08/13/2026 [facility]: Hemoglobin 9.4, platelets 53,000, white count 10.8, ANC 1.8, absolute lymphocyte count 8.2  CMP  Lab Results  Component Value Date   NA 139 07/29/2018   K 4.1 07/29/2018   CL 106 07/29/2018   CO2 27 07/29/2018   GLUCOSE 100 (H) 07/29/2018   BUN 17 07/29/2018   CREATININE 0.88 07/29/2018   CALCIUM 9.6 07/29/2018   PROT 6.1 (L) 07/29/2018   ALBUMIN 4.3 07/29/2018   AST 20 07/29/2018   ALT 25 07/29/2018   ALKPHOS 83 07/29/2018   BILITOT 0.6 07/29/2018   GFRNONAA >60 07/29/2018   GFRAA >60 07/29/2018     Medications: I have reviewed the patient's current medications.   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 Mild spinal megaly.  Ultrasound-guided biopsy of the left inguinal lymph node 09/18/2017, slightly "purulent "fluid aspirated, core biopsy is consistent with an atypical lymphoid proliferation-extensive necrosis with surrounding epithelioid histiocytes, limited intact lymphoid tissue involved with CLL  Incisional biopsy of a necrotic/purulent left inguinal lymph node on 10/01/2017-extensive necrosis with granulomatous inflammation, small amount of viable lymphoid tissue involved with CLL, AFB and fungal stains negative  Peripheral blood FISH analysis 02/05/2018- deletion 13q14, no evidence of p53 (17p13) deletion, no evidence of 11q22deletion  Bone marrow biopsy 02/26/2018-hypercellular marrow with extensive involvement by CLL, lymphocytes represent85% of all cells  Ibrutinib initiated 04/03/2018  Ibrutinib placed on hold 04/11/2018 due to onset of arthralgias  Ibrutinib resumed 04/16/2018, discontinued 04/25/2018 secondary to severe arthralgias/arthritis  Ibrutinib resumed at a dose of 140 mg daily 05/03/2018  Ibrutinib dose adjusted to 140 mg alternating with 258m 06/25/2018  Ibrutinib discontinued 07/03/2018 secondary to severe arthralgias 2.Hypothyroidism 3.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 Augmentin prescribed. 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  She has been maintained off of systemic therapy  for the past  month.  She has persistent anemia and thrombocytopenia.  A bone marrow confirmed extensive involvement by CLL. She saw Dr. Prince Solian yesterday.  Treatment options were reviewed.  Meagan Walsh has decided to proceed with a trial of acalabrutinib.  We reviewed potential toxicities associated with this agent including the chance for hematologic toxicity, bleeding, arthralgias/myalgias, nausea, and diarrhea.  She met with the [facility] pharmacist for further review of side effects.  She agrees to proceed.  The plan is to begin acalabrutinib on 08/16/2018.  She will return for a visit in 1 week in 2 weeks.  She will be scheduled for office visit in 3 weeks.  We will transfuse packed red blood cells as needed.  She will contact us for bleeding or bruising.  Betsy Coder, MD  08/14/2018  10:46 AM

## 2018-08-15 MED FILL — CALQUENCE 100 MG CAPSULE: 100 | 30 days supply | Qty: 60 | Fill #0

## 2018-08-15 NOTE — Telephone Encounter (Signed)
[facility] Encounter  Confirmed with [facility] that [place] was picked up on 08/15/18 with a $0 copay using a copay card.   [facility] [place] Phone [phone] Fax [phone]

## 2018-08-16 ENCOUNTER — Encounter (HOSPITAL_COMMUNITY): Payer: Self-pay | Admitting: Oncology

## 2018-08-19 ENCOUNTER — Telehealth: Payer: Self-pay | Admitting: *Deleted

## 2018-08-19 ENCOUNTER — Other Ambulatory Visit: Payer: Self-pay | Admitting: *Deleted

## 2018-08-19 ENCOUNTER — Inpatient Hospital Stay: Payer: BLUE CROSS/BLUE SHIELD

## 2018-08-19 DIAGNOSIS — C9110 Chronic lymphocytic leukemia of B-cell type not having achieved remission: Secondary | ICD-10-CM

## 2018-08-19 DIAGNOSIS — C9190 Lymphoid leukemia, unspecified not having achieved remission: Secondary | ICD-10-CM | POA: Diagnosis not present

## 2018-08-19 DIAGNOSIS — B1910 Unspecified viral hepatitis B without hepatic coma: Secondary | ICD-10-CM | POA: Diagnosis not present

## 2018-08-19 DIAGNOSIS — D801 Nonfamilial hypogammaglobulinemia: Secondary | ICD-10-CM | POA: Diagnosis not present

## 2018-08-19 DIAGNOSIS — M2550 Pain in unspecified joint: Secondary | ICD-10-CM | POA: Diagnosis not present

## 2018-08-19 DIAGNOSIS — D649 Anemia, unspecified: Secondary | ICD-10-CM

## 2018-08-19 DIAGNOSIS — D696 Thrombocytopenia, unspecified: Secondary | ICD-10-CM | POA: Diagnosis not present

## 2018-08-19 DIAGNOSIS — R0602 Shortness of breath: Secondary | ICD-10-CM | POA: Diagnosis not present

## 2018-08-19 DIAGNOSIS — Z9221 Personal history of antineoplastic chemotherapy: Secondary | ICD-10-CM | POA: Diagnosis not present

## 2018-08-19 DIAGNOSIS — E039 Hypothyroidism, unspecified: Secondary | ICD-10-CM | POA: Diagnosis not present

## 2018-08-19 DIAGNOSIS — D709 Neutropenia, unspecified: Secondary | ICD-10-CM | POA: Diagnosis not present

## 2018-08-19 LAB — CBC WITH DIFFERENTIAL (CANCER CENTER ONLY)
Abs Immature Granulocytes: 0.04 10*3/uL (ref 0.00–0.07)
Basophils Absolute: 0 10*3/uL (ref 0.0–0.1)
Basophils Relative: 0 %
Eosinophils Absolute: 0.1 10*3/uL (ref 0.0–0.5)
Eosinophils Relative: 0 %
HCT: 21 % — ABNORMAL LOW (ref 36.0–46.0)
Hemoglobin: 7.1 g/dL — ABNORMAL LOW (ref 12.0–15.0)
Immature Granulocytes: 0 %
Lymphocytes Relative: 91 %
Lymphs Abs: 16.4 10*3/uL — ABNORMAL HIGH (ref 0.7–4.0)
MCH: 30 pg (ref 26.0–34.0)
MCHC: 33.8 g/dL (ref 30.0–36.0)
MCV: 88.6 fL (ref 80.0–100.0)
Monocytes Absolute: 0.1 10*3/uL (ref 0.1–1.0)
Monocytes Relative: 1 %
Neutro Abs: 1.5 10*3/uL — ABNORMAL LOW (ref 1.7–7.7)
Neutrophils Relative %: 8 %
Platelet Count: 36 10*3/uL — ABNORMAL LOW (ref 150–400)
RBC: 2.37 MIL/uL — ABNORMAL LOW (ref 3.87–5.11)
RDW: 14.6 % (ref 11.5–15.5)
WBC Count: 18.1 10*3/uL — ABNORMAL HIGH (ref 4.0–10.5)
nRBC: 0 % (ref 0.0–0.2)

## 2018-08-19 LAB — PREPARE RBC (CROSSMATCH)

## 2018-08-19 LAB — SAMPLE TO BLOOD BANK

## 2018-08-19 NOTE — Telephone Encounter (Signed)
Called to request transfusion this week. Reports feeling weak; short of breath with minimal exertion and feels as if she can hear her heart beating in her head. Also reports she started taking the acalbrutinib on 08/15/18 evening. Per Dr. Benay Spice: OK to transfuse. She will have CBC and crossmatch today and transfuse tomorrow at [facility] at 0830. Patient notified and was provided directions to the infusion center.

## 2018-08-20 ENCOUNTER — Ambulatory Visit (HOSPITAL_COMMUNITY)
Admission: RE | Admit: 2018-08-20 | Discharge: 2018-08-20 | Disposition: A | Discharge status: Home / Self Care | Payer: BLUE CROSS/BLUE SHIELD | Source: Ambulatory Visit | Attending: Oncology | Admitting: Oncology

## 2018-08-20 DIAGNOSIS — C9110 Chronic lymphocytic leukemia of B-cell type not having achieved remission: Secondary | ICD-10-CM

## 2018-08-20 DIAGNOSIS — D649 Anemia, unspecified: Secondary | ICD-10-CM | POA: Insufficient documentation

## 2018-08-20 MED ORDER — DIPHENHYDRAMINE HCL 25 MG PO CAPS
25.0000 mg | ORAL_CAPSULE | Freq: Once | ORAL | Status: AC
  Administered 2018-08-20: 25 mg via ORAL
  Filled 2018-08-20: qty 1

## 2018-08-20 MED ORDER — SODIUM CHLORIDE 0.9% IV SOLUTION
250.0000 mL | Freq: Once | INTRAVENOUS | Status: AC
  Administered 2018-08-20: 250 mL via INTRAVENOUS

## 2018-08-20 MED ORDER — ACETAMINOPHEN 325 MG PO TABS
650.0000 mg | ORAL_TABLET | Freq: Once | ORAL | Status: AC
  Administered 2018-08-20: 650 mg via ORAL
  Filled 2018-08-20: qty 2

## 2018-08-20 NOTE — Progress Notes (Signed)
Patient received 2 units of packed red Blood cells via a PIV as ordered. Pre transfusion, patient was given PO Tylenol and PO Benadryl as ordered. Tolerated well, vitals stable, discharge instructions given, verbalized understanding. Patient alert, oriented and ambulatory at the time of discharge.

## 2018-08-20 NOTE — Discharge Instructions (Signed)
Blood Transfusion, Adult A blood transfusion is a procedure in which you are given blood through an IV tube. You may need this procedure because of:  Illness.  Surgery.  Injury. The blood may come from someone else (a donor). You may also be able to donate blood for yourself (autologous blood donation). The blood given in a transfusion is made up of different types of cells. You may get:  Red blood cells. These carry oxygen to the cells in the body.  White blood cells. These help you fight infections.  Platelets. These help your blood to clot.  Plasma. This is the liquid part of your blood. It helps with fluid imbalances. If you have a clotting disorder, you may also get other types of blood products. What happens before the procedure?  You will have a blood test to find out your blood type. The test also finds out what type of blood your body will accept and matches it to the donor type.  If you are going to have a planned surgery, you may be able to donate your own blood. This may be done in case you need a transfusion.  If you have had an allergic reaction to a transfusion in the past, you may be given medicine to help prevent a reaction. This medicine may be given to you by mouth or through an IV.  You will have your temperature, blood pressure, and pulse checked.  Follow instructions from your doctor about what you cannot eat or drink.  Ask your doctor about: ? Changing or stopping your regular medicines. This is important if you take diabetes medicines or blood thinners. ? Taking medicines such as aspirin and ibuprofen. These medicines can thin your blood. Do not take these medicines before your procedure if your doctor tells you not to. What happens during the procedure?  An IV tube will be put into one of your veins.  The bag of donated blood will be attached to your IV tube. Then, the blood will enter through your vein.  Your temperature, blood pressure, and pulse will  be checked regularly during the procedure. This is done to find early signs of a transfusion reaction.  If you have any signs or symptoms of a reaction, your transfusion will be stopped. You may also be given medicine.  When the transfusion is done, your IV tube will be taken out.  Pressure may be applied to the IV site for a few minutes.  A bandage (dressing) will be put on the IV site. The procedure may vary among doctors and hospitals. What happens after the procedure?  Your temperature, blood pressure, heart rate, breathing rate, and blood oxygen level will be checked often.  Your blood may be tested to see how you are responding to the transfusion.  You may be warmed with fluids or blankets. This is done to keep the temperature of your body normal. Summary  A blood transfusion is a procedure in which you are given blood through an IV tube.  The blood may come from someone else (a donor). You may also be able to donate blood for yourself.  If you have had an allergic reaction to a transfusion in the past, you may be given medicine to help prevent a reaction. This medicine may be given to you by mouth or through an IV tube.  Your temperature, blood pressure, heart rate, breathing rate, and blood oxygen level will be checked often.  Your blood may be tested to see   how you are responding to the transfusion. This information is not intended to replace advice given to you by your health care provider. Make sure you discuss any questions you have with your health care provider. Document Released: 10/06/2008 Document Revised: 03/03/2016 Document Reviewed: 03/03/2016 Elsevier Interactive Patient Education  2019 [employer].  

## 2018-08-21 LAB — BPAM RBC
Blood Product Expiration Date: 202003012359
Blood Product Expiration Date: 202003012359
ISSUE DATE / TIME: 202001280831
ISSUE DATE / TIME: 202001280831
Unit Type and Rh: 5100
Unit Type and Rh: 5100

## 2018-08-21 LAB — TYPE AND SCREEN
ABO/RH(D): O POS
Antibody Screen: NEGATIVE
Unit division: 0
Unit division: 0

## 2018-08-22 ENCOUNTER — Other Ambulatory Visit: Payer: BLUE CROSS/BLUE SHIELD

## 2018-08-29 ENCOUNTER — Inpatient Hospital Stay: Payer: BLUE CROSS/BLUE SHIELD | Attending: Oncology

## 2018-08-29 DIAGNOSIS — E039 Hypothyroidism, unspecified: Secondary | ICD-10-CM | POA: Diagnosis not present

## 2018-08-29 DIAGNOSIS — D649 Anemia, unspecified: Secondary | ICD-10-CM | POA: Insufficient documentation

## 2018-08-29 DIAGNOSIS — D801 Nonfamilial hypogammaglobulinemia: Secondary | ICD-10-CM | POA: Insufficient documentation

## 2018-08-29 DIAGNOSIS — C9110 Chronic lymphocytic leukemia of B-cell type not having achieved remission: Secondary | ICD-10-CM | POA: Insufficient documentation

## 2018-08-29 LAB — CBC WITH DIFFERENTIAL (CANCER CENTER ONLY)
Abs Immature Granulocytes: 0.02 10*3/uL (ref 0.00–0.07)
Basophils Absolute: 0 10*3/uL (ref 0.0–0.1)
Basophils Relative: 0 %
Eosinophils Absolute: 0.1 10*3/uL (ref 0.0–0.5)
Eosinophils Relative: 0 %
HCT: 24.7 % — ABNORMAL LOW (ref 36.0–46.0)
Hemoglobin: 8 g/dL — ABNORMAL LOW (ref 12.0–15.0)
Immature Granulocytes: 0 %
Lymphocytes Relative: 97 %
Lymphs Abs: 20.9 10*3/uL — ABNORMAL HIGH (ref 0.7–4.0)
MCH: 29.7 pg (ref 26.0–34.0)
MCHC: 32.4 g/dL (ref 30.0–36.0)
MCV: 91.8 fL (ref 80.0–100.0)
Monocytes Absolute: 0.1 10*3/uL (ref 0.1–1.0)
Monocytes Relative: 0 %
Neutro Abs: 0.7 10*3/uL — ABNORMAL LOW (ref 1.7–7.7)
Neutrophils Relative %: 3 %
Platelet Count: 32 10*3/uL — ABNORMAL LOW (ref 150–400)
RBC: 2.69 MIL/uL — ABNORMAL LOW (ref 3.87–5.11)
RDW: 13.8 % (ref 11.5–15.5)
WBC Count: 21.7 10*3/uL — ABNORMAL HIGH (ref 4.0–10.5)
nRBC: 0 % (ref 0.0–0.2)

## 2018-08-29 LAB — CMP (CANCER CENTER ONLY)
ALT: 16 U/L (ref 0–44)
AST: 16 U/L (ref 15–41)
Albumin: 4.4 g/dL (ref 3.5–5.0)
Alkaline Phosphatase: 84 U/L (ref 38–126)
Anion gap: 7 (ref 5–15)
BUN: 23 mg/dL — ABNORMAL HIGH (ref 6–20)
CO2: 27 mmol/L (ref 22–32)
Calcium: 9.7 mg/dL (ref 8.9–10.3)
Chloride: 106 mmol/L (ref 98–111)
Creatinine: 0.97 mg/dL (ref 0.44–1.00)
GFR, Est AFR Am: >60 mL/min (ref >60)
GFR, Estimated: >60 mL/min (ref >60)
Glucose, Bld: 130 mg/dL — ABNORMAL HIGH (ref 70–99)
Potassium: 4.4 mmol/L (ref 3.5–5.1)
Sodium: 140 mmol/L (ref 135–145)
Total Bilirubin: 0.6 mg/dL (ref 0.3–1.2)
Total Protein: 6.2 g/dL — ABNORMAL LOW (ref 6.5–8.1)

## 2018-08-29 LAB — SAMPLE TO BLOOD BANK

## 2018-08-30 ENCOUNTER — Telehealth: Payer: Self-pay | Admitting: *Deleted

## 2018-08-30 ENCOUNTER — Telehealth: Payer: Self-pay | Admitting: Oncology

## 2018-08-30 NOTE — Telephone Encounter (Signed)
Scheduled appt per 2/7 sch message - unable to reach patient- left message for patient with appt date and time

## 2018-08-30 NOTE — Telephone Encounter (Signed)
-----   Message from Ladell Pier, MD sent at 08/29/2018  5:17 PM EST ----- Please call patient, hb is better but still low, neutrophils and platelets are low, call for fever or bleeding, repeat cbc with blood bank sample 2/10, f/u as scheduled 2/14  Show Labs to [place] in pharmacy to discuss need for dose reduction of the acalabrutinib

## 2018-08-30 NOTE — Telephone Encounter (Signed)
Left message with note below.  Message to scheduler

## 2018-09-02 ENCOUNTER — Other Ambulatory Visit: Payer: Self-pay | Admitting: *Deleted

## 2018-09-02 ENCOUNTER — Inpatient Hospital Stay: Payer: BLUE CROSS/BLUE SHIELD

## 2018-09-02 DIAGNOSIS — E039 Hypothyroidism, unspecified: Secondary | ICD-10-CM | POA: Diagnosis not present

## 2018-09-02 DIAGNOSIS — C9110 Chronic lymphocytic leukemia of B-cell type not having achieved remission: Secondary | ICD-10-CM

## 2018-09-02 DIAGNOSIS — D649 Anemia, unspecified: Secondary | ICD-10-CM | POA: Diagnosis not present

## 2018-09-02 DIAGNOSIS — D801 Nonfamilial hypogammaglobulinemia: Secondary | ICD-10-CM | POA: Diagnosis not present

## 2018-09-02 LAB — CBC WITH DIFFERENTIAL (CANCER CENTER ONLY)
Abs Immature Granulocytes: 0.01 10*3/uL (ref 0.00–0.07)
Basophils Absolute: 0 10*3/uL (ref 0.0–0.1)
Basophils Relative: 0 %
Eosinophils Absolute: 0.1 10*3/uL (ref 0.0–0.5)
Eosinophils Relative: 0 %
HCT: 20.5 % — ABNORMAL LOW (ref 36.0–46.0)
Hemoglobin: 6.7 g/dL — CL (ref 12.0–15.0)
Immature Granulocytes: 0 %
Lymphocytes Relative: 95 %
Lymphs Abs: 16.6 10*3/uL — ABNORMAL HIGH (ref 0.7–4.0)
MCH: 29.8 pg (ref 26.0–34.0)
MCHC: 32.7 g/dL (ref 30.0–36.0)
MCV: 91.1 fL (ref 80.0–100.0)
Monocytes Absolute: 0.1 10*3/uL (ref 0.1–1.0)
Monocytes Relative: 1 %
Neutro Abs: 0.7 10*3/uL — ABNORMAL LOW (ref 1.7–7.7)
Neutrophils Relative %: 4 %
Platelet Count: 29 10*3/uL — ABNORMAL LOW (ref 150–400)
RBC: 2.25 MIL/uL — ABNORMAL LOW (ref 3.87–5.11)
RDW: 13.7 % (ref 11.5–15.5)
WBC Count: 17.4 10*3/uL — ABNORMAL HIGH (ref 4.0–10.5)
nRBC: 0 % (ref 0.0–0.2)

## 2018-09-02 LAB — SAMPLE TO BLOOD BANK

## 2018-09-02 LAB — PREPARE RBC (CROSSMATCH)

## 2018-09-02 NOTE — Progress Notes (Signed)
Hgb today 6.9--will transfuse on 2/11 at [facility] at 0800. Patient agrees and instructed to keep her blood bank bracelet on. Notified blood bank. Per Dr. Benay Spice: She does need to have labs again on 2/14 visit.

## 2018-09-04 ENCOUNTER — Ambulatory Visit (HOSPITAL_COMMUNITY)
Admission: RE | Admit: 2018-09-04 | Discharge: 2018-09-04 | Disposition: A | Discharge status: Home / Self Care | Payer: BLUE CROSS/BLUE SHIELD | Source: Ambulatory Visit | Attending: Oncology | Admitting: Oncology

## 2018-09-04 DIAGNOSIS — C9110 Chronic lymphocytic leukemia of B-cell type not having achieved remission: Secondary | ICD-10-CM | POA: Diagnosis not present

## 2018-09-04 DIAGNOSIS — D649 Anemia, unspecified: Secondary | ICD-10-CM | POA: Insufficient documentation

## 2018-09-04 MED ORDER — ACETAMINOPHEN 325 MG PO TABS
650.0000 mg | ORAL_TABLET | Freq: Once | ORAL | Status: AC
  Administered 2018-09-04: 650 mg via ORAL
  Filled 2018-09-04: qty 2

## 2018-09-04 MED ORDER — DIPHENHYDRAMINE HCL 25 MG PO CAPS
25.0000 mg | ORAL_CAPSULE | Freq: Once | ORAL | Status: AC
  Administered 2018-09-04: 25 mg via ORAL
  Filled 2018-09-04: qty 1

## 2018-09-04 MED ORDER — SODIUM CHLORIDE 0.9% IV SOLUTION
250.0000 mL | Freq: Once | INTRAVENOUS | Status: AC
  Administered 2018-09-04: 250 mL via INTRAVENOUS

## 2018-09-04 NOTE — Progress Notes (Signed)
[facility]  Procedure: Blood transfusion  Note:  Patient received 2 units of blood transfusion,tolerated the procedure, no adverse reaction.  Vital signs stable. Discharge instructions provided,verbalized understanding. Alert, oriented and ambulatory at discharge.

## 2018-09-04 NOTE — Discharge Instructions (Signed)
Blood Transfusion, Adult, Care After This sheet gives you information about how to care for yourself after your procedure. Your doctor may also give you more specific instructions. If you have problems or questions, contact your doctor. Follow these instructions at home:   Take over-the-counter and prescription medicines only as told by your doctor.  Go back to your normal activities as told by your doctor.  Follow instructions from your doctor about how to take care of the area where an IV tube was put into your vein (insertion site). Make sure you: ? Wash your hands with soap and water before you change your bandage (dressing). If there is no soap and water, use hand sanitizer. ? Change your bandage as told by your doctor.  Check your IV insertion site every day for signs of infection. Check for: ? More redness, swelling, or pain. ? More fluid or blood. ? Warmth. ? Pus or a bad smell. Contact a doctor if:  You have more redness, swelling, or pain around the IV insertion site.  You have more fluid or blood coming from the IV insertion site.  Your IV insertion site feels warm to the touch.  You have pus or a bad smell coming from the IV insertion site.  Your pee (urine) turns pink, red, or brown.  You feel weak after doing your normal activities. Get help right away if:  You have signs of a serious allergic or body defense (immune) system reaction, including: ? Itchiness. ? Hives. ? Trouble breathing. ? Anxiety. ? Pain in your chest or lower back. ? Fever, flushing, and chills. ? Fast pulse. ? Rash. ? Watery poop (diarrhea). ? Throwing up (vomiting). ? Dark pee. ? Serious headache. ? Dizziness. ? Stiff neck. ? Yellow color in your face or the white parts of your eyes (jaundice). Summary  After a blood transfusion, return to your normal activities as told by your doctor.  Every day, check for signs of infection where the IV tube was put into your vein.  Some  signs of infection are warm skin, more redness and pain, more fluid or blood, and pus or a bad smell where the needle went in.  Contact your doctor if you feel weak or have any unusual symptoms. This information is not intended to replace advice given to you by your health care provider. Make sure you discuss any questions you have with your health care provider. Document Released: 07/31/2014 Document Revised: 03/03/2016 Document Reviewed: 03/03/2016 Elsevier Interactive Patient Education  2019 [employer] Inc.  

## 2018-09-05 LAB — TYPE AND SCREEN
ABO/RH(D): O POS
Antibody Screen: NEGATIVE
Unit division: 0
Unit division: 0

## 2018-09-05 LAB — BPAM RBC
Blood Product Expiration Date: 202003102359
Blood Product Expiration Date: 202003122359
ISSUE DATE / TIME: 202002120827
ISSUE DATE / TIME: 202002120827
Unit Type and Rh: 5100
Unit Type and Rh: 5100

## 2018-09-06 ENCOUNTER — Encounter: Payer: Self-pay | Admitting: Nurse Practitioner

## 2018-09-06 ENCOUNTER — Inpatient Hospital Stay (HOSPITAL_BASED_OUTPATIENT_CLINIC_OR_DEPARTMENT_OTHER): Payer: BLUE CROSS/BLUE SHIELD | Admitting: Nurse Practitioner

## 2018-09-06 ENCOUNTER — Inpatient Hospital Stay: Payer: BLUE CROSS/BLUE SHIELD

## 2018-09-06 ENCOUNTER — Telehealth: Payer: Self-pay | Admitting: Nurse Practitioner

## 2018-09-06 VITALS — BP 107/60 | HR 83 | Temp 98.2°F | Resp 17 | Ht 66.0 in | Wt 127.2 lb

## 2018-09-06 DIAGNOSIS — E039 Hypothyroidism, unspecified: Secondary | ICD-10-CM | POA: Diagnosis not present

## 2018-09-06 DIAGNOSIS — D801 Nonfamilial hypogammaglobulinemia: Secondary | ICD-10-CM | POA: Diagnosis not present

## 2018-09-06 DIAGNOSIS — D649 Anemia, unspecified: Secondary | ICD-10-CM

## 2018-09-06 DIAGNOSIS — C9110 Chronic lymphocytic leukemia of B-cell type not having achieved remission: Secondary | ICD-10-CM | POA: Diagnosis not present

## 2018-09-06 LAB — CBC WITH DIFFERENTIAL (CANCER CENTER ONLY)
Abs Immature Granulocytes: 0.03 10*3/uL (ref 0.00–0.07)
Basophils Absolute: 0 10*3/uL (ref 0.0–0.1)
Basophils Relative: 0 %
Eosinophils Absolute: 0 10*3/uL (ref 0.0–0.5)
Eosinophils Relative: 0 %
HCT: 30 % — ABNORMAL LOW (ref 36.0–46.0)
Hemoglobin: 9.8 g/dL — ABNORMAL LOW (ref 12.0–15.0)
Immature Granulocytes: 0 %
Lymphocytes Relative: 97 %
Lymphs Abs: 23.7 10*3/uL — ABNORMAL HIGH (ref 0.7–4.0)
MCH: 29.6 pg (ref 26.0–34.0)
MCHC: 32.7 g/dL (ref 30.0–36.0)
MCV: 90.6 fL (ref 80.0–100.0)
Monocytes Absolute: 0.1 10*3/uL (ref 0.1–1.0)
Monocytes Relative: 0 %
Neutro Abs: 0.6 10*3/uL — ABNORMAL LOW (ref 1.7–7.7)
Neutrophils Relative %: 3 %
Platelet Count: 31 10*3/uL — ABNORMAL LOW (ref 150–400)
RBC: 3.31 MIL/uL — ABNORMAL LOW (ref 3.87–5.11)
RDW: 13.7 % (ref 11.5–15.5)
WBC Count: 24.5 10*3/uL — ABNORMAL HIGH (ref 4.0–10.5)
nRBC: 0.1 % (ref 0.0–0.2)

## 2018-09-06 LAB — SAMPLE TO BLOOD BANK

## 2018-09-06 NOTE — Progress Notes (Addendum)
[facility] OFFICE PROGRESS NOTE   Diagnosis: CLL  INTERVAL HISTORY:   Meagan Walsh returns as scheduled.  She began acalabrutinib 08/16/2018.  She was transfused 2 units of blood 09/04/2018 for hemoglobin of 6.7.  She feels much better since the blood transfusion.  She notes a "giant bruise" at the left thigh as a result of trauma.  She notes other scattered small bruises on both thighs unrelated to trauma.  She denies bleeding.  No fevers or sweats.  No nausea, vomiting or diarrhea.  No arthralgias.  Objective:  Vital signs in last 24 hours:  Blood pressure 107/60, pulse 83, temperature 98.2 F (36.8 C), temperature source Oral, resp. rate 17, height '5\' 6"'$  (1.676 m), weight 127 lb 3.2 oz (57.7 kg), SpO2 100 %.    HEENT: No thrush or ulcers. Lymphatics: No palpable cervical, supraclavicular, axillary or left inguinal lymph nodes.  Tiny right inguinal lymph node. Resp: Lungs clear bilaterally. Cardio: Regular rate and rhythm. GI: Abdomen soft and nontender.  No splenomegaly. Vascular: No leg edema.  Skin: Large resolving ecchymosis left upper thigh.  Smaller ecchymoses in various stages of resolution bilateral thighs.   Lab Results:  Lab Results  Component Value Date   WBC 24.5 (H) 09/06/2018   HGB 9.8 (L) 09/06/2018   HCT 30.0 (L) 09/06/2018   MCV 90.6 09/06/2018   PLT 31 (L) 09/06/2018   NEUTROABS PENDING 09/06/2018    Imaging:  No results found.  Medications: I have reviewed the patient's current medications.  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 Mild spinal megaly.  Ultrasound-guided biopsy of the left inguinal lymph node 09/18/2017, slightly  "purulent "fluid aspirated, core biopsy is consistent with an atypical lymphoid proliferation-extensive necrosis with surrounding epithelioid histiocytes, limited intact lymphoid tissue involved with CLL  Incisional biopsy of a necrotic/purulent left inguinal lymph node on 10/01/2017-extensive necrosis with granulomatous inflammation, small amount of viable lymphoid tissue involved with CLL, AFB and fungal stains negative  Peripheral blood FISH analysis 02/05/2018- deletion 13q14, no evidence of p53 (17p13) deletion, no evidence of 11q22deletion  Bone marrow biopsy 02/26/2018-hypercellular marrow with extensive involvement by CLL, lymphocytes represent85% of all cells  Ibrutinib initiated 04/03/2018  Ibrutinib placed on hold 04/11/2018 due to onset of arthralgias  Ibrutinib resumed 04/16/2018, discontinued 04/25/2018 secondary to severe arthralgias/arthritis  Ibrutinib resumed at a dose of 140 mg daily 05/03/2018  Ibrutinib dose adjusted to 140 mg alternating with '280mg'$  06/25/2018  Ibrutinib discontinued 07/03/2018 secondary to severe arthralgias  Acalabrutinib 08/16/2018 2.Hypothyroidism 3.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 Augmentin prescribed. 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  She has now been on acalabrutinib about 3 weeks.  Hemoglobin is better following a recent blood transfusion.  White count and platelets are stable.  Plan to continue acalabrutinib.  She will return for a CBC 09/09/2018 and 09/16/2018.  Red cell transfusion support as needed.  She will return for lab and follow-up 09/23/2018.  She will contact the office in  the interim with any problems.  We specifically discussed fever, chills, other signs of  infection, increased bruising, bleeding.  Patient seen with Dr. Benay Spice.    Ned Card ANP/GNP-BC   09/06/2018  12:10 PM This was a shared visit with Ned Card.  Meagan Walsh is tolerating the alcohol ibrutinib well, but she has persistent pancytopenia.  She has required frequent red cell transfusions.  We decided to continue the alcohol ibrutinib at the current dose.  We will follow the CBC closely.  We will consider a repeat bone marrow biopsy and changing to a different treatment if the cytopenias have not improved over the next month.  [name], MD

## 2018-09-06 NOTE — Telephone Encounter (Signed)
Scheduled appt per 02/14 los.

## 2018-09-09 ENCOUNTER — Telehealth: Payer: Self-pay

## 2018-09-09 ENCOUNTER — Inpatient Hospital Stay: Payer: BLUE CROSS/BLUE SHIELD

## 2018-09-09 ENCOUNTER — Other Ambulatory Visit: Payer: Self-pay | Admitting: Oncology

## 2018-09-09 DIAGNOSIS — C9110 Chronic lymphocytic leukemia of B-cell type not having achieved remission: Secondary | ICD-10-CM | POA: Diagnosis not present

## 2018-09-09 DIAGNOSIS — E039 Hypothyroidism, unspecified: Secondary | ICD-10-CM | POA: Diagnosis not present

## 2018-09-09 DIAGNOSIS — D801 Nonfamilial hypogammaglobulinemia: Secondary | ICD-10-CM | POA: Diagnosis not present

## 2018-09-09 DIAGNOSIS — D649 Anemia, unspecified: Secondary | ICD-10-CM | POA: Diagnosis not present

## 2018-09-09 LAB — CBC WITH DIFFERENTIAL (CANCER CENTER ONLY)
Abs Immature Granulocytes: 0.04 10*3/uL (ref 0.00–0.07)
Basophils Absolute: 0 10*3/uL (ref 0.0–0.1)
Basophils Relative: 0 %
Eosinophils Absolute: 0 10*3/uL (ref 0.0–0.5)
Eosinophils Relative: 0 %
HCT: 28.4 % — ABNORMAL LOW (ref 36.0–46.0)
Hemoglobin: 9.3 g/dL — ABNORMAL LOW (ref 12.0–15.0)
Immature Granulocytes: 0 %
Lymphocytes Relative: 98 %
Lymphs Abs: 27.2 10*3/uL — ABNORMAL HIGH (ref 0.7–4.0)
MCH: 29.3 pg (ref 26.0–34.0)
MCHC: 32.7 g/dL (ref 30.0–36.0)
MCV: 89.6 fL (ref 80.0–100.0)
Monocytes Absolute: 0.1 10*3/uL (ref 0.1–1.0)
Monocytes Relative: 0 %
Neutro Abs: 0.5 10*3/uL — ABNORMAL LOW (ref 1.7–7.7)
Neutrophils Relative %: 2 %
Platelet Count: 30 10*3/uL — ABNORMAL LOW (ref 150–400)
RBC: 3.17 MIL/uL — ABNORMAL LOW (ref 3.87–5.11)
RDW: 13.2 % (ref 11.5–15.5)
WBC Count: 27.9 10*3/uL — ABNORMAL HIGH (ref 4.0–10.5)
nRBC: 0 % (ref 0.0–0.2)

## 2018-09-09 LAB — SAMPLE TO BLOOD BANK

## 2018-09-09 NOTE — Telephone Encounter (Signed)
TC to pt per Lattie Haw to tell her the cbc is stable, decrease acalabrutinib to 100mg  daily, call for fever or bleeding, f/u as scheduled. Also blood transfusion is canceled for tomorrow 09/10/2018. Pt verbalized understanding. No further problems or concerns at this time.

## 2018-09-10 ENCOUNTER — Other Ambulatory Visit: Payer: Self-pay | Admitting: Psychiatry

## 2018-09-10 ENCOUNTER — Inpatient Hospital Stay: Payer: BLUE CROSS/BLUE SHIELD

## 2018-09-16 ENCOUNTER — Inpatient Hospital Stay: Payer: BLUE CROSS/BLUE SHIELD

## 2018-09-16 DIAGNOSIS — D649 Anemia, unspecified: Secondary | ICD-10-CM | POA: Diagnosis not present

## 2018-09-16 DIAGNOSIS — C9110 Chronic lymphocytic leukemia of B-cell type not having achieved remission: Secondary | ICD-10-CM

## 2018-09-16 DIAGNOSIS — E039 Hypothyroidism, unspecified: Secondary | ICD-10-CM | POA: Diagnosis not present

## 2018-09-16 DIAGNOSIS — D801 Nonfamilial hypogammaglobulinemia: Secondary | ICD-10-CM | POA: Diagnosis not present

## 2018-09-16 LAB — CBC WITH DIFFERENTIAL (CANCER CENTER ONLY)
Abs Immature Granulocytes: 0.01 10*3/uL (ref 0.00–0.07)
Basophils Absolute: 0.1 10*3/uL (ref 0.0–0.1)
Basophils Relative: 0 %
Eosinophils Absolute: 0 10*3/uL (ref 0.0–0.5)
Eosinophils Relative: 0 %
HCT: 26.4 % — ABNORMAL LOW (ref 36.0–46.0)
Hemoglobin: 8.3 g/dL — ABNORMAL LOW (ref 12.0–15.0)
Immature Granulocytes: 0 %
Lymphocytes Relative: 98 %
Lymphs Abs: 34.6 10*3/uL — ABNORMAL HIGH (ref 0.7–4.0)
MCH: 29.1 pg (ref 26.0–34.0)
MCHC: 31.4 g/dL (ref 30.0–36.0)
MCV: 92.6 fL (ref 80.0–100.0)
Monocytes Absolute: 0.1 10*3/uL (ref 0.1–1.0)
Monocytes Relative: 0 %
Neutro Abs: 0.5 10*3/uL — ABNORMAL LOW (ref 1.7–7.7)
Neutrophils Relative %: 2 %
Platelet Count: 58 10*3/uL — ABNORMAL LOW (ref 150–400)
RBC: 2.85 MIL/uL — ABNORMAL LOW (ref 3.87–5.11)
RDW: 13.5 % (ref 11.5–15.5)
WBC Count: 35.3 10*3/uL — ABNORMAL HIGH (ref 4.0–10.5)
nRBC: 0 % (ref 0.0–0.2)

## 2018-09-16 LAB — SAMPLE TO BLOOD BANK

## 2018-09-17 ENCOUNTER — Telehealth: Payer: Self-pay | Admitting: *Deleted

## 2018-09-17 NOTE — Telephone Encounter (Signed)
Notified of CBC results and that Hgb is going down slower. Platelets are improved. Continue acalbrutinib 100 mg daily. Call for symptoms that indicate she needs blood transfusion. Will add blood bank sample to labs on 09/23/18.

## 2018-09-19 MED FILL — CALQUENCE 100 MG CAPSULE: 100 | 30 days supply | Qty: 30 | Fill #0

## 2018-09-20 ENCOUNTER — Other Ambulatory Visit: Payer: Self-pay | Admitting: *Deleted

## 2018-09-20 ENCOUNTER — Inpatient Hospital Stay: Payer: BLUE CROSS/BLUE SHIELD

## 2018-09-20 ENCOUNTER — Telehealth: Payer: Self-pay | Admitting: *Deleted

## 2018-09-20 DIAGNOSIS — C9110 Chronic lymphocytic leukemia of B-cell type not having achieved remission: Secondary | ICD-10-CM

## 2018-09-20 DIAGNOSIS — D649 Anemia, unspecified: Secondary | ICD-10-CM

## 2018-09-20 DIAGNOSIS — D801 Nonfamilial hypogammaglobulinemia: Secondary | ICD-10-CM | POA: Diagnosis not present

## 2018-09-20 DIAGNOSIS — E039 Hypothyroidism, unspecified: Secondary | ICD-10-CM | POA: Diagnosis not present

## 2018-09-20 LAB — CBC WITH DIFFERENTIAL (CANCER CENTER ONLY)
Abs Immature Granulocytes: 0.05 10*3/uL (ref 0.00–0.07)
Basophils Absolute: 0 10*3/uL (ref 0.0–0.1)
Basophils Relative: 0 %
Eosinophils Absolute: 0 10*3/uL (ref 0.0–0.5)
Eosinophils Relative: 0 %
HCT: 25.2 % — ABNORMAL LOW (ref 36.0–46.0)
Hemoglobin: 7.9 g/dL — ABNORMAL LOW (ref 12.0–15.0)
Immature Granulocytes: 0 %
Lymphocytes Relative: 98 %
Lymphs Abs: 29.5 10*3/uL — ABNORMAL HIGH (ref 0.7–4.0)
MCH: 29.5 pg (ref 26.0–34.0)
MCHC: 31.3 g/dL (ref 30.0–36.0)
MCV: 94 fL (ref 80.0–100.0)
Monocytes Absolute: 0.1 10*3/uL (ref 0.1–1.0)
Monocytes Relative: 0 %
Neutro Abs: 0.6 10*3/uL — ABNORMAL LOW (ref 1.7–7.7)
Neutrophils Relative %: 2 %
Platelet Count: 53 10*3/uL — ABNORMAL LOW (ref 150–400)
RBC: 2.68 MIL/uL — ABNORMAL LOW (ref 3.87–5.11)
RDW: 13.3 % (ref 11.5–15.5)
WBC Count: 30.3 10*3/uL — ABNORMAL HIGH (ref 4.0–10.5)
nRBC: 0 % (ref 0.0–0.2)

## 2018-09-20 LAB — SAMPLE TO BLOOD BANK

## 2018-09-20 LAB — PREPARE RBC (CROSSMATCH)

## 2018-09-20 NOTE — Telephone Encounter (Signed)
Patient wants transfusion today or Monday (reports she can't come on Saturday). Able to schedule [facility] at [facility] on 09/23/18 at 0800. Will need to move her OV with Dr. Benay Spice on 3/2 to 3/6 at 0800 or see NP on 3/3 or 3/5. She will be called by scheduler to come in today for her blood work instead of Monday. Left this voice mail for her to call with her decision regarding timing of OV

## 2018-09-20 NOTE — Telephone Encounter (Signed)
Patient called to discuss her schedule. Due to work and going out of town she has chosen to see Dr. Benay Spice on 10/01/18 at 4pm with lab prior.

## 2018-09-20 NOTE — Progress Notes (Signed)
Will receive blood on 09/23/18--orders entered w/type and screen and prepare today. Transfuse on 09/23/18.

## 2018-09-23 ENCOUNTER — Inpatient Hospital Stay: Payer: BLUE CROSS/BLUE SHIELD | Admitting: Oncology

## 2018-09-23 ENCOUNTER — Inpatient Hospital Stay: Payer: BLUE CROSS/BLUE SHIELD

## 2018-09-23 ENCOUNTER — Ambulatory Visit (HOSPITAL_COMMUNITY)
Admission: RE | Admit: 2018-09-23 | Discharge: 2018-09-23 | Disposition: A | Discharge status: Home / Self Care | Payer: BLUE CROSS/BLUE SHIELD | Source: Ambulatory Visit | Attending: Oncology | Admitting: Oncology

## 2018-09-23 DIAGNOSIS — D649 Anemia, unspecified: Secondary | ICD-10-CM | POA: Insufficient documentation

## 2018-09-23 DIAGNOSIS — C9110 Chronic lymphocytic leukemia of B-cell type not having achieved remission: Secondary | ICD-10-CM | POA: Insufficient documentation

## 2018-09-23 MED ORDER — ACETAMINOPHEN 325 MG PO TABS
650.0000 mg | ORAL_TABLET | Freq: Once | ORAL | Status: AC
  Administered 2018-09-23: 650 mg via ORAL
  Filled 2018-09-23: qty 2

## 2018-09-23 MED ORDER — DIPHENHYDRAMINE HCL 25 MG PO CAPS: 25.0000 mg | ORAL_CAPSULE | Freq: Once | ORAL | Status: DC

## 2018-09-23 MED ORDER — SODIUM CHLORIDE 0.9% IV SOLUTION
250.0000 mL | Freq: Once | INTRAVENOUS | Status: AC
  Administered 2018-09-23: 250 mL via INTRAVENOUS

## 2018-09-23 NOTE — Discharge Instructions (Signed)
Blood Transfusion, Adult A blood transfusion is a procedure in which you are given blood through an IV tube. You may need this procedure because of:  Illness.  Surgery.  Injury. The blood may come from someone else (a donor). You may also be able to donate blood for yourself (autologous blood donation). The blood given in a transfusion is made up of different types of cells. You may get:  Red blood cells. These carry oxygen to the cells in the body.  White blood cells. These help you fight infections.  Platelets. These help your blood to clot.  Plasma. This is the liquid part of your blood. It helps with fluid imbalances. If you have a clotting disorder, you may also get other types of blood products. What happens before the procedure?  You will have a blood test to find out your blood type. The test also finds out what type of blood your body will accept and matches it to the donor type.  If you are going to have a planned surgery, you may be able to donate your own blood. This may be done in case you need a transfusion.  If you have had an allergic reaction to a transfusion in the past, you may be given medicine to help prevent a reaction. This medicine may be given to you by mouth or through an IV.  You will have your temperature, blood pressure, and pulse checked.  Follow instructions from your doctor about what you cannot eat or drink.  Ask your doctor about: ? Changing or stopping your regular medicines. This is important if you take diabetes medicines or blood thinners. ? Taking medicines such as aspirin and ibuprofen. These medicines can thin your blood. Do not take these medicines before your procedure if your doctor tells you not to. What happens during the procedure?  An IV tube will be put into one of your veins.  The bag of donated blood will be attached to your IV tube. Then, the blood will enter through your vein.  Your temperature, blood pressure, and pulse will  be checked regularly during the procedure. This is done to find early signs of a transfusion reaction.  If you have any signs or symptoms of a reaction, your transfusion will be stopped. You may also be given medicine.  When the transfusion is done, your IV tube will be taken out.  Pressure may be applied to the IV site for a few minutes.  A bandage (dressing) will be put on the IV site. The procedure may vary among doctors and hospitals. What happens after the procedure?  Your temperature, blood pressure, heart rate, breathing rate, and blood oxygen level will be checked often.  Your blood may be tested to see how you are responding to the transfusion.  You may be warmed with fluids or blankets. This is done to keep the temperature of your body normal. Summary  A blood transfusion is a procedure in which you are given blood through an IV tube.  The blood may come from someone else (a donor). You may also be able to donate blood for yourself.  If you have had an allergic reaction to a transfusion in the past, you may be given medicine to help prevent a reaction. This medicine may be given to you by mouth or through an IV tube.  Your temperature, blood pressure, heart rate, breathing rate, and blood oxygen level will be checked often.  Your blood may be tested to see   how you are responding to the transfusion. This information is not intended to replace advice given to you by your health care provider. Make sure you discuss any questions you have with your health care provider. Document Released: 10/06/2008 Document Revised: 03/03/2016 Document Reviewed: 03/03/2016 Elsevier Interactive Patient Education  2019 [employer].  

## 2018-09-23 NOTE — Progress Notes (Signed)
Patient received 2 units of packed red Blood cells via a PIV as ordered. Pre transfusion, patient was given PO Tylenol but refused PO Benadryl. Tolerated well, vitals stable, discharge instructions given, verbalized understanding. Patient alert, oriented and ambulatory at the time of discharge.

## 2018-09-24 ENCOUNTER — Ambulatory Visit
Admission: RE | Admit: 2018-09-24 | Discharge: 2018-09-24 | Disposition: A | Discharge status: Home / Self Care | Payer: BLUE CROSS/BLUE SHIELD | Source: Ambulatory Visit | Attending: Internal Medicine | Admitting: Internal Medicine

## 2018-09-24 DIAGNOSIS — Z1231 Encounter for screening mammogram for malignant neoplasm of breast: Secondary | ICD-10-CM | POA: Diagnosis not present

## 2018-09-24 DIAGNOSIS — Z13820 Encounter for screening for osteoporosis: Secondary | ICD-10-CM | POA: Diagnosis not present

## 2018-09-24 DIAGNOSIS — Z780 Asymptomatic menopausal state: Secondary | ICD-10-CM | POA: Diagnosis not present

## 2018-09-24 LAB — TYPE AND SCREEN
ABO/RH(D): O POS
Antibody Screen: NEGATIVE
Unit division: 0
Unit division: 0

## 2018-09-24 LAB — BPAM RBC
Blood Product Expiration Date: 202003272359
Blood Product Expiration Date: 202003312359
ISSUE DATE / TIME: 202003020824
ISSUE DATE / TIME: 202003020824
Unit Type and Rh: 5100
Unit Type and Rh: 5100

## 2018-10-01 ENCOUNTER — Inpatient Hospital Stay: Payer: BLUE CROSS/BLUE SHIELD | Admitting: Oncology

## 2018-10-01 ENCOUNTER — Inpatient Hospital Stay: Payer: BLUE CROSS/BLUE SHIELD

## 2018-10-01 VITALS — BP 99/56 | HR 80 | Temp 98.4°F | Resp 18 | Ht 66.0 in | Wt 131.2 lb

## 2018-10-01 DIAGNOSIS — B1910 Unspecified viral hepatitis B without hepatic coma: Secondary | ICD-10-CM | POA: Diagnosis not present

## 2018-10-01 DIAGNOSIS — M2550 Pain in unspecified joint: Secondary | ICD-10-CM | POA: Insufficient documentation

## 2018-10-01 DIAGNOSIS — C9110 Chronic lymphocytic leukemia of B-cell type not having achieved remission: Secondary | ICD-10-CM

## 2018-10-01 DIAGNOSIS — E039 Hypothyroidism, unspecified: Secondary | ICD-10-CM

## 2018-10-01 DIAGNOSIS — Z8701 Personal history of pneumonia (recurrent): Secondary | ICD-10-CM

## 2018-10-01 DIAGNOSIS — D801 Nonfamilial hypogammaglobulinemia: Secondary | ICD-10-CM | POA: Insufficient documentation

## 2018-10-01 DIAGNOSIS — R630 Anorexia: Secondary | ICD-10-CM

## 2018-10-01 DIAGNOSIS — D649 Anemia, unspecified: Secondary | ICD-10-CM | POA: Insufficient documentation

## 2018-10-01 DIAGNOSIS — Z9221 Personal history of antineoplastic chemotherapy: Secondary | ICD-10-CM

## 2018-10-01 LAB — CBC WITH DIFFERENTIAL (CANCER CENTER ONLY)
Abs Immature Granulocytes: 0.01 10*3/uL (ref 0.00–0.07)
Basophils Absolute: 0.1 10*3/uL (ref 0.0–0.1)
Basophils Relative: 0 %
Eosinophils Absolute: 0 10*3/uL (ref 0.0–0.5)
Eosinophils Relative: 0 %
HCT: 27 % — ABNORMAL LOW (ref 36.0–46.0)
Hemoglobin: 8.7 g/dL — ABNORMAL LOW (ref 12.0–15.0)
Immature Granulocytes: 0 %
Lymphocytes Relative: 96 %
Lymphs Abs: 35 10*3/uL — ABNORMAL HIGH (ref 0.7–4.0)
MCH: 30.2 pg (ref 26.0–34.0)
MCHC: 32.2 g/dL (ref 30.0–36.0)
MCV: 93.8 fL (ref 80.0–100.0)
Monocytes Absolute: 0.2 10*3/uL (ref 0.1–1.0)
Monocytes Relative: 1 %
Neutro Abs: 1.2 10*3/uL — ABNORMAL LOW (ref 1.7–7.7)
Neutrophils Relative %: 3 %
Platelet Count: 65 10*3/uL — ABNORMAL LOW (ref 150–400)
RBC: 2.88 MIL/uL — ABNORMAL LOW (ref 3.87–5.11)
RDW: 13.3 % (ref 11.5–15.5)
WBC Count: 36.5 10*3/uL — ABNORMAL HIGH (ref 4.0–10.5)
nRBC: 0 % (ref 0.0–0.2)

## 2018-10-01 LAB — SAMPLE TO BLOOD BANK

## 2018-10-01 NOTE — Progress Notes (Signed)
[place] OFFICE PROGRESS NOTE   Diagnosis: CLL  INTERVAL HISTORY:   Meagan Walsh returns as scheduled.  She continues acalabrutinib at a dose of 100 mg daily.  No bleeding or pain.  She feels well.  She was transfused with packed red blood cells on 09/23/2018 after the hemoglobin returned at 7.9 on 09/20/2018.  She does not feel in need of a red cell transfusion today.  No fever or symptoms of an infection.  The palpable lymph nodes have resolved.  Objective:  Vital signs in last 24 hours:  Blood pressure (!) 99/56, pulse 80, temperature 98.4 F (36.9 C), temperature source Oral, resp. rate 18, height _0  (1.676 m), weight 131 lb 3.2 oz (59.5 kg), SpO2 100 %.    HEENT: No thrush or ulcers Lymphatics: No cervical, supraclavicular, or submental nodes.  No left inguinal node.  1/2 cm medial right inguinal node Resp: Lungs clear bilaterally Cardio: Regular rate and rhythm GI: No hepatosplenomegaly Vascular: No leg edema  Skin: Small resolving ecchymosis of the right lower leg    Lab Results:  Lab Results  Component Value Date   WBC 36.5 (H) 10/01/2018   HGB 8.7 (L) 10/01/2018   HCT 27.0 (L) 10/01/2018   MCV 93.8 10/01/2018   PLT 65 (L) 10/01/2018   NEUTROABS 1.2 (L) 10/01/2018    CMP  Lab Results  Component Value Date   NA 140 08/29/2018   K 4.4 08/29/2018   CL 106 08/29/2018   CO2 27 08/29/2018   GLUCOSE 130 (H) 08/29/2018   BUN 23 (H) 08/29/2018   CREATININE 0.97 08/29/2018   CALCIUM 9.7 08/29/2018   PROT 6.2 (L) 08/29/2018   ALBUMIN 4.4 08/29/2018   AST 16 08/29/2018   ALT 16 08/29/2018   ALKPHOS 84 08/29/2018   BILITOT 0.6 08/29/2018   GFRNONAA >60 08/29/2018   GFRAA >60 08/29/2018    Medications: I have reviewed the patient's current medications.   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 Mild spinal megaly.  Ultrasound-guided biopsy of the left inguinal lymph node 09/18/2017, slightly "purulent "fluid aspirated, core biopsy is consistent with an atypical lymphoid proliferation-extensive necrosis with surrounding epithelioid histiocytes, limited intact lymphoid tissue involved with CLL  Incisional biopsy of a necrotic/purulent left inguinal lymph node on 10/01/2017-extensive necrosis with granulomatous inflammation, small amount of viable lymphoid tissue involved with CLL, AFB and fungal stains negative  Peripheral blood FISH analysis 02/05/2018- deletion 13q14, no evidence of p53 (17p13) deletion, no evidence of 11q22deletion  Bone marrow biopsy 02/26/2018-hypercellular marrow with extensive involvement by CLL, lymphocytes represent85% of all cells  Ibrutinib initiated 04/03/2018  Ibrutinib placed on hold 04/11/2018 due to onset of arthralgias  Ibrutinib resumed 04/16/2018, discontinued 04/25/2018 secondary to severe arthralgias/arthritis  Ibrutinib resumed at a dose of 140 mg daily 05/03/2018  Ibrutinib dose adjusted to 140 mg alternating with 28m 06/25/2018  Ibrutinib discontinued 07/03/2018 secondary to severe arthralgias  Acalabrutinib 08/16/2018 2.Hypothyroidism 3.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 Augmentin prescribed. 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 NWirsingappears  well.  She is tolerating the acalabrutinib without apparent toxicity.  The neutropenia  and thrombocytopenia have improved over the past month.  The hemoglobin is not following as far between transfusions.  Palpable lymphadenopathy has improved.  She has gained weight.  She appears to be responding to the acalabrutinib.  She will continue acalabrutinib at the current dose.  She plans to leave on a trip out of the country next week.  I recommended she not travel overseas due to the risk of infection.  She is immunocompromised and has a history of recurrent pneumonia.  She indicates she plans to travel unless airline flights are canceled.  She will return for a CBC on 10/04/2018.  We will arrange for a red cell transfusion on 10/07/2018 if needed.  She will be scheduled for an office and lab visit on 10/17/2018.  Betsy Coder, MD  10/01/2018  4:49 PM

## 2018-10-02 ENCOUNTER — Other Ambulatory Visit: Payer: Self-pay | Admitting: *Deleted

## 2018-10-02 ENCOUNTER — Telehealth: Payer: Self-pay | Admitting: Oncology

## 2018-10-02 DIAGNOSIS — D649 Anemia, unspecified: Secondary | ICD-10-CM

## 2018-10-02 DIAGNOSIS — C9110 Chronic lymphocytic leukemia of B-cell type not having achieved remission: Secondary | ICD-10-CM

## 2018-10-02 NOTE — Telephone Encounter (Signed)
Scheduled appt per 3/10 los - pt aware of appt date and time

## 2018-10-02 NOTE — Progress Notes (Signed)
Scheduled transfusion for 10/07/18 at [facility] and orders entered. Patient notified.

## 2018-10-03 ENCOUNTER — Other Ambulatory Visit: Payer: Self-pay | Admitting: Psychiatry

## 2018-10-04 ENCOUNTER — Inpatient Hospital Stay: Payer: BLUE CROSS/BLUE SHIELD

## 2018-10-04 ENCOUNTER — Other Ambulatory Visit: Payer: Self-pay

## 2018-10-04 DIAGNOSIS — M2550 Pain in unspecified joint: Secondary | ICD-10-CM | POA: Diagnosis not present

## 2018-10-04 DIAGNOSIS — D801 Nonfamilial hypogammaglobulinemia: Secondary | ICD-10-CM | POA: Diagnosis not present

## 2018-10-04 DIAGNOSIS — Z8701 Personal history of pneumonia (recurrent): Secondary | ICD-10-CM | POA: Diagnosis not present

## 2018-10-04 DIAGNOSIS — D649 Anemia, unspecified: Secondary | ICD-10-CM | POA: Diagnosis not present

## 2018-10-04 DIAGNOSIS — C9110 Chronic lymphocytic leukemia of B-cell type not having achieved remission: Secondary | ICD-10-CM | POA: Diagnosis not present

## 2018-10-04 DIAGNOSIS — E039 Hypothyroidism, unspecified: Secondary | ICD-10-CM | POA: Diagnosis not present

## 2018-10-04 LAB — CMP (CANCER CENTER ONLY)
ALT: 18 U/L (ref 0–44)
AST: 18 U/L (ref 15–41)
Albumin: 4.3 g/dL (ref 3.5–5.0)
Alkaline Phosphatase: 91 U/L (ref 38–126)
Anion gap: 9 (ref 5–15)
BUN: 14 mg/dL (ref 6–20)
CO2: 26 mmol/L (ref 22–32)
Calcium: 9.3 mg/dL (ref 8.9–10.3)
Chloride: 105 mmol/L (ref 98–111)
Creatinine: 0.88 mg/dL (ref 0.44–1.00)
GFR, Est AFR Am: >60 mL/min (ref >60)
GFR, Estimated: >60 mL/min (ref >60)
Glucose, Bld: 95 mg/dL (ref 70–99)
Potassium: 4.3 mmol/L (ref 3.5–5.1)
Sodium: 140 mmol/L (ref 135–145)
Total Bilirubin: 0.6 mg/dL (ref 0.3–1.2)
Total Protein: 6 g/dL — ABNORMAL LOW (ref 6.5–8.1)

## 2018-10-04 LAB — CBC WITH DIFFERENTIAL (CANCER CENTER ONLY)
Abs Immature Granulocytes: 0.01 10*3/uL (ref 0.00–0.07)
Basophils Absolute: 0 10*3/uL (ref 0.0–0.1)
Basophils Relative: 0 %
Eosinophils Absolute: 0 10*3/uL (ref 0.0–0.5)
Eosinophils Relative: 0 %
HCT: 25.8 % — ABNORMAL LOW (ref 36.0–46.0)
Hemoglobin: 8.2 g/dL — ABNORMAL LOW (ref 12.0–15.0)
Immature Granulocytes: 0 %
Lymphocytes Relative: 91 %
Lymphs Abs: 22.8 10*3/uL — ABNORMAL HIGH (ref 0.7–4.0)
MCH: 29.9 pg (ref 26.0–34.0)
MCHC: 31.8 g/dL (ref 30.0–36.0)
MCV: 94.2 fL (ref 80.0–100.0)
Monocytes Absolute: 0.3 10*3/uL (ref 0.1–1.0)
Monocytes Relative: 1 %
Neutro Abs: 2 10*3/uL (ref 1.7–7.7)
Neutrophils Relative %: 8 %
Platelet Count: 68 10*3/uL — ABNORMAL LOW (ref 150–400)
RBC: 2.74 MIL/uL — ABNORMAL LOW (ref 3.87–5.11)
RDW: 13.2 % (ref 11.5–15.5)
WBC Count: 25.2 10*3/uL — ABNORMAL HIGH (ref 4.0–10.5)
nRBC: 0 % (ref 0.0–0.2)

## 2018-10-04 LAB — LACTATE DEHYDROGENASE: LDH: 139 U/L (ref 98–192)

## 2018-10-04 LAB — PREPARE RBC (CROSSMATCH)

## 2018-10-07 ENCOUNTER — Other Ambulatory Visit: Payer: Self-pay | Admitting: Psychiatry

## 2018-10-07 ENCOUNTER — Ambulatory Visit (HOSPITAL_COMMUNITY)
Admission: RE | Admit: 2018-10-07 | Discharge: 2018-10-07 | Disposition: A | Discharge status: Home / Self Care | Payer: BLUE CROSS/BLUE SHIELD | Source: Ambulatory Visit | Attending: Oncology | Admitting: Oncology

## 2018-10-07 ENCOUNTER — Other Ambulatory Visit: Payer: Self-pay

## 2018-10-07 ENCOUNTER — Telehealth: Payer: Self-pay | Admitting: *Deleted

## 2018-10-07 DIAGNOSIS — C9110 Chronic lymphocytic leukemia of B-cell type not having achieved remission: Secondary | ICD-10-CM

## 2018-10-07 DIAGNOSIS — E039 Hypothyroidism, unspecified: Secondary | ICD-10-CM | POA: Diagnosis not present

## 2018-10-07 DIAGNOSIS — D801 Nonfamilial hypogammaglobulinemia: Secondary | ICD-10-CM | POA: Diagnosis not present

## 2018-10-07 DIAGNOSIS — R05 Cough: Secondary | ICD-10-CM | POA: Diagnosis not present

## 2018-10-07 DIAGNOSIS — M2550 Pain in unspecified joint: Secondary | ICD-10-CM | POA: Diagnosis not present

## 2018-10-07 DIAGNOSIS — Z8701 Personal history of pneumonia (recurrent): Secondary | ICD-10-CM | POA: Diagnosis not present

## 2018-10-07 DIAGNOSIS — D649 Anemia, unspecified: Secondary | ICD-10-CM

## 2018-10-07 MED ORDER — ACETAMINOPHEN 325 MG PO TABS: 650.0000 mg | ORAL_TABLET | Freq: Once | ORAL | Status: DC

## 2018-10-07 MED ORDER — LITHIUM CARBONATE ER 300 MG PO TBCR: 600.0000 mg | EXTENDED_RELEASE_TABLET | Freq: Every day | ORAL | 0 refills | Status: DC

## 2018-10-07 MED ORDER — SODIUM CHLORIDE 0.9% IV SOLUTION
250.0000 mL | Freq: Once | INTRAVENOUS | Status: AC
  Administered 2018-10-07: 250 mL via INTRAVENOUS

## 2018-10-07 MED ORDER — DIPHENHYDRAMINE HCL 25 MG PO CAPS: 25.0000 mg | ORAL_CAPSULE | Freq: Once | ORAL | Status: DC

## 2018-10-07 NOTE — Telephone Encounter (Signed)
Called to report she has developed a cough and "my cough always turns into pneumonia". No fever or shortness of breath. Asking when she could come over for Dr. Benay Spice to listen to her chest? Per Dr. Benay Spice: CXR today and she will be called with results. Patient notified and agrees to this plan.

## 2018-10-07 NOTE — Progress Notes (Signed)
Lithium ER 300 mg tablets 2 every afternoon were sent into the pharmacy of note the patient reduced to this dosage from 900 mg daily and April 2019

## 2018-10-07 NOTE — Discharge Instructions (Signed)
Blood Transfusion, Adult, Care After This sheet gives you information about how to care for yourself after your procedure. Your doctor may also give you more specific instructions. If you have problems or questions, contact your doctor. Follow these instructions at home:   Take over-the-counter and prescription medicines only as told by your doctor.  Go back to your normal activities as told by your doctor.  Follow instructions from your doctor about how to take care of the area where an IV tube was put into your vein (insertion site). Make sure you: ? Wash your hands with soap and water before you change your bandage (dressing). If there is no soap and water, use hand sanitizer. ? Change your bandage as told by your doctor.  Check your IV insertion site every day for signs of infection. Check for: ? More redness, swelling, or pain. ? More fluid or blood. ? Warmth. ? Pus or a bad smell. Contact a doctor if:  You have more redness, swelling, or pain around the IV insertion site.  You have more fluid or blood coming from the IV insertion site.  Your IV insertion site feels warm to the touch.  You have pus or a bad smell coming from the IV insertion site.  Your pee (urine) turns pink, red, or brown.  You feel weak after doing your normal activities. Get help right away if:  You have signs of a serious allergic or body defense (immune) system reaction, including: ? Itchiness. ? Hives. ? Trouble breathing. ? Anxiety. ? Pain in your chest or lower back. ? Fever, flushing, and chills. ? Fast pulse. ? Rash. ? Watery poop (diarrhea). ? Throwing up (vomiting). ? Dark pee. ? Serious headache. ? Dizziness. ? Stiff neck. ? Yellow color in your face or the white parts of your eyes (jaundice). Summary  After a blood transfusion, return to your normal activities as told by your doctor.  Every day, check for signs of infection where the IV tube was put into your vein.  Some  signs of infection are warm skin, more redness and pain, more fluid or blood, and pus or a bad smell where the needle went in.  Contact your doctor if you feel weak or have any unusual symptoms. This information is not intended to replace advice given to you by your health care provider. Make sure you discuss any questions you have with your health care provider. Document Released: 07/31/2014 Document Revised: 03/03/2016 Document Reviewed: 03/03/2016 Elsevier Interactive Patient Education  2019 [employer] Inc.  

## 2018-10-07 NOTE — Progress Notes (Signed)
[facility]ARE [facility] NOTE  Diagnosis: CLL (chronic lymphocytic leukemia) ([place]) (C91.10); Anemia, unspecified type (D64.9)   Provider: Betsy Coder, MD   Procedure: 2 units PRBC's    Note: Patient received 2 units of blood. Refused pre-medications (Tylenol and Benadryl). Tolerated transfusion well with no adverse reaction. Vital signs stable.  Discharge instructions given. Patient alert, oriented and ambulatory at discharge.

## 2018-10-08 ENCOUNTER — Telehealth: Payer: Self-pay | Admitting: *Deleted

## 2018-10-08 LAB — TYPE AND SCREEN
ABO/RH(D): O POS
Antibody Screen: NEGATIVE
Unit division: 0
Unit division: 0

## 2018-10-08 LAB — BPAM RBC
Blood Product Expiration Date: 202004102359
Blood Product Expiration Date: 202004112359
ISSUE DATE / TIME: 202003160836
ISSUE DATE / TIME: 202003160836
Unit Type and Rh: 5100
Unit Type and Rh: 5100

## 2018-10-08 MED ORDER — LEVOFLOXACIN 500 MG PO TABS: 500.0000 mg | ORAL_TABLET | Freq: Every day | ORAL | 0 refills | Status: DC

## 2018-10-08 NOTE — Telephone Encounter (Signed)
Patient reports she had a fever last of 101.5 that responded to one dose of Advil. Fever resolved today. Wonders if the temp is related to her transfusion or the URI?  Per Dr. Benay Spice: CXR is clear. Based on her history will start Levaquin 500 mg daily X 5 days. Continue OTC meds for symptom control. Call office for persistent high fever or shortness of breath (do not come to office). She understands and agrees.

## 2018-10-10 ENCOUNTER — Other Ambulatory Visit: Payer: Self-pay

## 2018-10-10 ENCOUNTER — Other Ambulatory Visit: Payer: Self-pay | Admitting: Oncology

## 2018-10-10 DIAGNOSIS — C9110 Chronic lymphocytic leukemia of B-cell type not having achieved remission: Secondary | ICD-10-CM

## 2018-10-10 MED ORDER — VORTIOXETINE HBR 20 MG PO TABS: 20.0000 mg | ORAL_TABLET | Freq: Every day | ORAL | 0 refills | Status: DC

## 2018-10-10 NOTE — Progress Notes (Signed)
Refill request from [facility] [address] for 90 day Trintellix 20mg  Last office visit 07/2018

## 2018-10-17 ENCOUNTER — Inpatient Hospital Stay: Payer: BLUE CROSS/BLUE SHIELD

## 2018-10-17 ENCOUNTER — Inpatient Hospital Stay (HOSPITAL_BASED_OUTPATIENT_CLINIC_OR_DEPARTMENT_OTHER): Payer: BLUE CROSS/BLUE SHIELD | Admitting: Nurse Practitioner

## 2018-10-17 ENCOUNTER — Telehealth: Payer: Self-pay | Admitting: Nurse Practitioner

## 2018-10-17 ENCOUNTER — Encounter: Payer: Self-pay | Admitting: Nurse Practitioner

## 2018-10-17 ENCOUNTER — Other Ambulatory Visit: Payer: Self-pay

## 2018-10-17 VITALS — BP 109/64 | HR 75 | Temp 97.6°F | Resp 18 | Ht 66.0 in | Wt 129.4 lb

## 2018-10-17 DIAGNOSIS — E039 Hypothyroidism, unspecified: Secondary | ICD-10-CM

## 2018-10-17 DIAGNOSIS — Z8701 Personal history of pneumonia (recurrent): Secondary | ICD-10-CM

## 2018-10-17 DIAGNOSIS — D649 Anemia, unspecified: Secondary | ICD-10-CM

## 2018-10-17 DIAGNOSIS — D801 Nonfamilial hypogammaglobulinemia: Secondary | ICD-10-CM

## 2018-10-17 DIAGNOSIS — C9110 Chronic lymphocytic leukemia of B-cell type not having achieved remission: Secondary | ICD-10-CM

## 2018-10-17 DIAGNOSIS — M2550 Pain in unspecified joint: Secondary | ICD-10-CM

## 2018-10-17 LAB — CBC WITH DIFFERENTIAL (CANCER CENTER ONLY)
Abs Immature Granulocytes: 0.01 10*3/uL (ref 0.00–0.07)
Basophils Absolute: 0 10*3/uL (ref 0.0–0.1)
Basophils Relative: 0 %
Eosinophils Absolute: 0.1 10*3/uL (ref 0.0–0.5)
Eosinophils Relative: 0 %
HCT: 28.6 % — ABNORMAL LOW (ref 36.0–46.0)
Hemoglobin: 9.4 g/dL — ABNORMAL LOW (ref 12.0–15.0)
Immature Granulocytes: 0 %
Lymphocytes Relative: 88 %
Lymphs Abs: 16.8 10*3/uL — ABNORMAL HIGH (ref 0.7–4.0)
MCH: 30.2 pg (ref 26.0–34.0)
MCHC: 32.9 g/dL (ref 30.0–36.0)
MCV: 92 fL (ref 80.0–100.0)
Monocytes Absolute: 0.3 10*3/uL (ref 0.1–1.0)
Monocytes Relative: 2 %
Neutro Abs: 1.8 10*3/uL (ref 1.7–7.7)
Neutrophils Relative %: 10 %
Platelet Count: 57 10*3/uL — ABNORMAL LOW (ref 150–400)
RBC: 3.11 MIL/uL — ABNORMAL LOW (ref 3.87–5.11)
RDW: 12.9 % (ref 11.5–15.5)
WBC Count: 19 10*3/uL — ABNORMAL HIGH (ref 4.0–10.5)
nRBC: 0 % (ref 0.0–0.2)

## 2018-10-17 LAB — SAMPLE TO BLOOD BANK

## 2018-10-17 MED FILL — CALQUENCE 100 MG CAPSULE: 100 | 30 days supply | Qty: 30 | Fill #0

## 2018-10-17 NOTE — Telephone Encounter (Signed)
Scheduled per los, declined printout  ° °

## 2018-10-17 NOTE — Progress Notes (Addendum)
[place] OFFICE PROGRESS NOTE   Diagnosis: CLL  INTERVAL HISTORY:   Meagan Walsh returns as scheduled.  She continues acalabrutinib 100 mg daily.  She was transfused 2 units of blood 10/07/2018.  She states that she feels "great".  The cough has resolved.  She initially noted improvement in the cough after just a few doses of the antibiotic.  No shortness of breath.  No fever.  No sweats.  She denies bleeding.  Stable tiny lymph node in the right inguinal region.  Objective:  Vital signs in last 24 hours:  Blood pressure 109/64, pulse 75, temperature 97.6 F (36.4 C), temperature source Oral, resp. rate 18, height '5\' 6"'  (1.676 m), weight 129 lb 6.4 oz (58.7 kg), SpO2 100 %.    HEENT: No thrush or ulcers. Lymphatics: No palpable cervical, supraclavicular or axillary lymph nodes.  Approximate half centimeter right inguinal lymph node. Resp: Lungs clear bilaterally. Cardio: Regular rate and rhythm. GI: No hepatosplenomegaly. Vascular: No leg edema.    Lab Results:  Lab Results  Component Value Date   WBC 19.0 (H) 10/17/2018   HGB 9.4 (L) 10/17/2018   HCT 28.6 (L) 10/17/2018   MCV 92.0 10/17/2018   PLT 57 (L) 10/17/2018   NEUTROABS 1.8 10/17/2018    Imaging:  No results found.  Medications: I have reviewed the patient's current medications.  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 Mild spinal megaly.  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 p53 (17p13) deletion, no evidence of 11q22deletion  Bone marrow biopsy 02/26/2018-hypercellular marrow with extensive involvement by CLL, lymphocytes represent85% of all cells  Ibrutinib initiated 04/03/2018  Ibrutinib placed on hold 04/11/2018 due to onset of arthralgias  Ibrutinib resumed 04/16/2018, discontinued 04/25/2018 secondary to severe arthralgias/arthritis  Ibrutinib resumed at a dose of 140 mg daily 05/03/2018  Ibrutinib dose adjusted to 140 mg alternating with 272m 06/25/2018  Ibrutinib discontinued 07/03/2018 secondary to severe arthralgias  Acalabrutinib 08/16/2018 2.Hypothyroidism 3.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 Augmentin prescribed. 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 NStieberappears well.  She will continue acalabrutinib as she is currently taking.    We reviewed the CBC from today.  The neutropenia continues to be improved.  Platelet count is stable.  She is requiring less frequent transfusion support.  She recently completed a 5-day course of Levaquin after developing a cough.  Chest x-ray showed possible infiltrate at the left base.  Upon review of the x-ray the changes at the left base appear chronic.  The cough has resolved.  She will return for a CBC on 10/28/2018.  She will return for  lab and follow-up  on 11/08/2018.  She will contact the office in the interim with any problems.  We specifically discussed any signs of infection and signs/symptoms suggestive of progressive anemia.  Patient seen with Dr. Benay Spice.    Ned Card ANP/GNP-BC   10/17/2018  2:19 PM This was a shared visit with Ned Card. Ms. Boeve appears well.  The anemia and lymphocytosis appear improved.  The transfusion requirement appears to be decreasing.  She will continue acalabrutinib at the current dose.  She appears to be tolerating the treatment well.  I reviewed the chest x-ray from 10/07/2018 in radiology.  The findings at the left lung base appear chronic.  [name], MD

## 2018-10-23 ENCOUNTER — Telehealth: Payer: Self-pay | Admitting: *Deleted

## 2018-10-23 NOTE — Telephone Encounter (Signed)
Reports "I feel like I'm getting low" again. Wants lab on 10/25/2018. Scheduled lab, but made her aware we are not able to transfuse her unless her Hgb is 7.0 or less. Appointment made as requested.

## 2018-10-25 ENCOUNTER — Telehealth: Payer: Self-pay | Admitting: *Deleted

## 2018-10-25 ENCOUNTER — Telehealth: Payer: Self-pay | Admitting: Nurse Practitioner

## 2018-10-25 ENCOUNTER — Inpatient Hospital Stay: Payer: BLUE CROSS/BLUE SHIELD | Attending: Oncology

## 2018-10-25 ENCOUNTER — Other Ambulatory Visit: Payer: Self-pay

## 2018-10-25 DIAGNOSIS — E039 Hypothyroidism, unspecified: Secondary | ICD-10-CM | POA: Insufficient documentation

## 2018-10-25 DIAGNOSIS — D801 Nonfamilial hypogammaglobulinemia: Secondary | ICD-10-CM | POA: Diagnosis not present

## 2018-10-25 DIAGNOSIS — Z8701 Personal history of pneumonia (recurrent): Secondary | ICD-10-CM | POA: Diagnosis not present

## 2018-10-25 DIAGNOSIS — Z9221 Personal history of antineoplastic chemotherapy: Secondary | ICD-10-CM | POA: Diagnosis not present

## 2018-10-25 DIAGNOSIS — C9190 Lymphoid leukemia, unspecified not having achieved remission: Secondary | ICD-10-CM | POA: Insufficient documentation

## 2018-10-25 DIAGNOSIS — C9110 Chronic lymphocytic leukemia of B-cell type not having achieved remission: Secondary | ICD-10-CM

## 2018-10-25 DIAGNOSIS — B1910 Unspecified viral hepatitis B without hepatic coma: Secondary | ICD-10-CM | POA: Diagnosis not present

## 2018-10-25 DIAGNOSIS — D630 Anemia in neoplastic disease: Secondary | ICD-10-CM | POA: Insufficient documentation

## 2018-10-25 LAB — CBC WITH DIFFERENTIAL (CANCER CENTER ONLY)
Abs Immature Granulocytes: 0.03 10*3/uL (ref 0.00–0.07)
Basophils Absolute: 0 10*3/uL (ref 0.0–0.1)
Basophils Relative: 0 %
Eosinophils Absolute: 0.1 10*3/uL (ref 0.0–0.5)
Eosinophils Relative: 0 %
HCT: 24.6 % — ABNORMAL LOW (ref 36.0–46.0)
Hemoglobin: 7.9 g/dL — ABNORMAL LOW (ref 12.0–15.0)
Immature Granulocytes: 0 %
Lymphocytes Relative: 92 %
Lymphs Abs: 25.6 10*3/uL — ABNORMAL HIGH (ref 0.7–4.0)
MCH: 29.6 pg (ref 26.0–34.0)
MCHC: 32.1 g/dL (ref 30.0–36.0)
MCV: 92.1 fL (ref 80.0–100.0)
Monocytes Absolute: 0.2 10*3/uL (ref 0.1–1.0)
Monocytes Relative: 1 %
Neutro Abs: 2 10*3/uL (ref 1.7–7.7)
Neutrophils Relative %: 7 %
Platelet Count: 47 10*3/uL — ABNORMAL LOW (ref 150–400)
RBC: 2.67 MIL/uL — ABNORMAL LOW (ref 3.87–5.11)
RDW: 12.9 % (ref 11.5–15.5)
WBC Count: 27.9 10*3/uL — ABNORMAL HIGH (ref 4.0–10.5)
nRBC: 0 % (ref 0.0–0.2)

## 2018-10-25 LAB — SAMPLE TO BLOOD BANK

## 2018-10-25 NOTE — Telephone Encounter (Signed)
Per Dr. Benay Spice, Meagan Walsh instructed to increase the acalabrutinib dose to twice daily.

## 2018-10-25 NOTE — Telephone Encounter (Signed)
Notified of CBC results today w/Hgb 7.9. Due to new blood bank policy, she will not be able to be transfused at this time. She wishes to move her 4/6 lab to 4/7. Also informed her of WBC and platelet count. Instructed her to try to not overexert herself or she has any chest pain or syncope from low blood count.

## 2018-10-28 ENCOUNTER — Inpatient Hospital Stay: Payer: BLUE CROSS/BLUE SHIELD

## 2018-10-28 ENCOUNTER — Telehealth: Payer: Self-pay | Admitting: *Deleted

## 2018-10-28 NOTE — Telephone Encounter (Signed)
Asking RN to go ahead and schedule her for 1 unit of blood tomorrow after her CBC at 0900. She is certain she will qualify for 1 unit to be transfused. Reports her heart is racing and she is very fatigued. Attempted to schedule with [facility]: there was not answer.

## 2018-10-29 ENCOUNTER — Inpatient Hospital Stay: Payer: BLUE CROSS/BLUE SHIELD

## 2018-10-29 ENCOUNTER — Telehealth: Payer: Self-pay | Admitting: *Deleted

## 2018-10-29 ENCOUNTER — Other Ambulatory Visit: Payer: Self-pay | Admitting: *Deleted

## 2018-10-29 ENCOUNTER — Other Ambulatory Visit: Payer: Self-pay

## 2018-10-29 ENCOUNTER — Ambulatory Visit (HOSPITAL_COMMUNITY)
Admission: RE | Admit: 2018-10-29 | Discharge: 2018-10-29 | Disposition: A | Discharge status: Home / Self Care | Payer: BLUE CROSS/BLUE SHIELD | Source: Ambulatory Visit | Attending: Oncology | Admitting: Oncology

## 2018-10-29 DIAGNOSIS — D801 Nonfamilial hypogammaglobulinemia: Secondary | ICD-10-CM | POA: Diagnosis not present

## 2018-10-29 DIAGNOSIS — C9190 Lymphoid leukemia, unspecified not having achieved remission: Secondary | ICD-10-CM | POA: Diagnosis not present

## 2018-10-29 DIAGNOSIS — C9110 Chronic lymphocytic leukemia of B-cell type not having achieved remission: Secondary | ICD-10-CM | POA: Insufficient documentation

## 2018-10-29 DIAGNOSIS — D649 Anemia, unspecified: Secondary | ICD-10-CM

## 2018-10-29 DIAGNOSIS — B1910 Unspecified viral hepatitis B without hepatic coma: Secondary | ICD-10-CM | POA: Diagnosis not present

## 2018-10-29 DIAGNOSIS — Z9221 Personal history of antineoplastic chemotherapy: Secondary | ICD-10-CM | POA: Diagnosis not present

## 2018-10-29 DIAGNOSIS — E039 Hypothyroidism, unspecified: Secondary | ICD-10-CM | POA: Diagnosis not present

## 2018-10-29 DIAGNOSIS — D630 Anemia in neoplastic disease: Secondary | ICD-10-CM | POA: Diagnosis not present

## 2018-10-29 DIAGNOSIS — Z8701 Personal history of pneumonia (recurrent): Secondary | ICD-10-CM | POA: Diagnosis not present

## 2018-10-29 LAB — CBC WITH DIFFERENTIAL (CANCER CENTER ONLY)
Abs Immature Granulocytes: 0.02 10*3/uL (ref 0.00–0.07)
Basophils Absolute: 0 10*3/uL (ref 0.0–0.1)
Basophils Relative: 0 %
Eosinophils Absolute: 0 10*3/uL (ref 0.0–0.5)
Eosinophils Relative: 0 %
HCT: 21.7 % — ABNORMAL LOW (ref 36.0–46.0)
Hemoglobin: 6.9 g/dL — CL (ref 12.0–15.0)
Immature Granulocytes: 0 %
Lymphocytes Relative: 95 %
Lymphs Abs: 28.9 10*3/uL — ABNORMAL HIGH (ref 0.7–4.0)
MCH: 29.1 pg (ref 26.0–34.0)
MCHC: 31.8 g/dL (ref 30.0–36.0)
MCV: 91.6 fL (ref 80.0–100.0)
Monocytes Absolute: 0.1 10*3/uL (ref 0.1–1.0)
Monocytes Relative: 0 %
Neutro Abs: 1.5 10*3/uL — ABNORMAL LOW (ref 1.7–7.7)
Neutrophils Relative %: 5 %
Platelet Count: 42 10*3/uL — ABNORMAL LOW (ref 150–400)
RBC: 2.37 MIL/uL — ABNORMAL LOW (ref 3.87–5.11)
RDW: 12.9 % (ref 11.5–15.5)
WBC Count: 30.6 10*3/uL — ABNORMAL HIGH (ref 4.0–10.5)
nRBC: 0 % (ref 0.0–0.2)

## 2018-10-29 LAB — RETICULOCYTES
Immature Retic Fract: 2.1 % — ABNORMAL LOW (ref 2.3–15.9)
RBC.: 2.38 MIL/uL — ABNORMAL LOW (ref 3.87–5.11)
Retic Count, Absolute: 3.3 10*3/uL — ABNORMAL LOW (ref 19.0–186.0)
Retic Ct Pct: 0.1 % — ABNORMAL LOW (ref 0.4–3.1)

## 2018-10-29 LAB — SAMPLE TO BLOOD BANK

## 2018-10-29 LAB — PREPARE RBC (CROSSMATCH)

## 2018-10-29 MED ORDER — ACETAMINOPHEN 325 MG PO TABS: 650.0000 mg | ORAL_TABLET | Freq: Once | ORAL | Status: DC

## 2018-10-29 MED ORDER — DIPHENHYDRAMINE HCL 25 MG PO CAPS: 25.0000 mg | ORAL_CAPSULE | Freq: Once | ORAL | Status: DC

## 2018-10-29 MED ORDER — SODIUM CHLORIDE 0.9% IV SOLUTION
250.0000 mL | Freq: Once | INTRAVENOUS | Status: AC
  Administered 2018-10-29: 250 mL via INTRAVENOUS

## 2018-10-29 NOTE — Progress Notes (Signed)
PATIENT CARE CENTER NOTE  Diagnosis: Anemia, unspecified type (D64.9); CLL (chronic lymphocytic leukemia) ([facility]) (C91.10)  Provider: Dr. Benay Spice   Procedure: 1 unit PRBC   Note: Patient received 1 unit of blood. Patient refused pre-medications (Tylenol and Benadryl). Tolerated transfusion well. Vital signs remained stable. Discharge instructions given. Patient alert, oriented and ambulatory at discharge.

## 2018-10-29 NOTE — Telephone Encounter (Signed)
Notified of message below. Verbalized understanding.   Message to scheduler.  

## 2018-10-29 NOTE — Progress Notes (Signed)
Will transfuse 1 unit today. Patient Meagan Walsh will see her now. Patient notified.

## 2018-10-29 NOTE — Discharge Instructions (Signed)
Blood Transfusion, Adult A blood transfusion is a procedure in which you are given blood through an IV tube. You may need this procedure because of:  Illness.  Surgery.  Injury. The blood may come from someone else (a donor). You may also be able to donate blood for yourself (autologous blood donation). The blood given in a transfusion is made up of different types of cells. You may get:  Red blood cells. These carry oxygen to the cells in the body.  White blood cells. These help you fight infections.  Platelets. These help your blood to clot.  Plasma. This is the liquid part of your blood. It helps with fluid imbalances. If you have a clotting disorder, you may also get other types of blood products. What happens before the procedure?  You will have a blood test to find out your blood type. The test also finds out what type of blood your body will accept and matches it to the donor type.  If you are going to have a planned surgery, you may be able to donate your own blood. This may be done in case you need a transfusion.  If you have had an allergic reaction to a transfusion in the past, you may be given medicine to help prevent a reaction. This medicine may be given to you by mouth or through an IV.  You will have your temperature, blood pressure, and pulse checked.  Follow instructions from your doctor about what you cannot eat or drink.  Ask your doctor about: ? Changing or stopping your regular medicines. This is important if you take diabetes medicines or blood thinners. ? Taking medicines such as aspirin and ibuprofen. These medicines can thin your blood. Do not take these medicines before your procedure if your doctor tells you not to. What happens during the procedure?  An IV tube will be put into one of your veins.  The bag of donated blood will be attached to your IV tube. Then, the blood will enter through your vein.  Your temperature, blood pressure, and pulse will  be checked regularly during the procedure. This is done to find early signs of a transfusion reaction.  If you have any signs or symptoms of a reaction, your transfusion will be stopped. You may also be given medicine.  When the transfusion is done, your IV tube will be taken out.  Pressure may be applied to the IV site for a few minutes.  A bandage (dressing) will be put on the IV site. The procedure may vary among doctors and hospitals. What happens after the procedure?  Your temperature, blood pressure, heart rate, breathing rate, and blood oxygen level will be checked often.  Your blood may be tested to see how you are responding to the transfusion.  You may be warmed with fluids or blankets. This is done to keep the temperature of your body normal. Summary  A blood transfusion is a procedure in which you are given blood through an IV tube.  The blood may come from someone else (a donor). You may also be able to donate blood for yourself.  If you have had an allergic reaction to a transfusion in the past, you may be given medicine to help prevent a reaction. This medicine may be given to you by mouth or through an IV tube.  Your temperature, blood pressure, heart rate, breathing rate, and blood oxygen level will be checked often.  Your blood may be tested to see   how you are responding to the transfusion. This information is not intended to replace advice given to you by your health care provider. Make sure you discuss any questions you have with your health care provider. Document Released: 10/06/2008 Document Revised: 03/03/2016 Document Reviewed: 03/03/2016 Elsevier Interactive Patient Education  2019 [employer].  

## 2018-10-29 NOTE — Telephone Encounter (Signed)
-----   Message from Ladell Pier, MD sent at 10/29/2018 12:19 PM EDT ----- Please call patient, over 3 weeks since last transfusion, schedule cbc, blood bank sample, cmet for 4/10 Add reticulocyte count 4/7 Schedule web or phone visit for 4/10 or 4/14 Continue acalabrutinib at current dose, will decrease back to 100mg  daily if platelets or neutrophils fall on current dose Call for fever or bleeding

## 2018-10-30 ENCOUNTER — Encounter (HOSPITAL_COMMUNITY): Payer: BLUE CROSS/BLUE SHIELD

## 2018-10-30 ENCOUNTER — Telehealth: Payer: Self-pay | Admitting: Nurse Practitioner

## 2018-10-30 ENCOUNTER — Telehealth: Payer: Self-pay | Admitting: Oncology

## 2018-10-30 LAB — BPAM RBC
Blood Product Expiration Date: 202004212359
ISSUE DATE / TIME: 202004071110
Unit Type and Rh: 5100

## 2018-10-30 LAB — TYPE AND SCREEN
ABO/RH(D): O POS
Antibody Screen: NEGATIVE
Unit division: 0

## 2018-10-30 NOTE — Telephone Encounter (Signed)
[name] instructed to decrease acalabrutinib from twice daily to once daily.

## 2018-10-30 NOTE — Telephone Encounter (Signed)
Spoke with patient re 4/10 lab and 4/14 webex.

## 2018-11-01 ENCOUNTER — Inpatient Hospital Stay: Payer: BLUE CROSS/BLUE SHIELD

## 2018-11-01 ENCOUNTER — Telehealth: Payer: Self-pay | Admitting: *Deleted

## 2018-11-01 ENCOUNTER — Other Ambulatory Visit: Payer: Self-pay

## 2018-11-01 DIAGNOSIS — D630 Anemia in neoplastic disease: Secondary | ICD-10-CM | POA: Diagnosis not present

## 2018-11-01 DIAGNOSIS — Z9221 Personal history of antineoplastic chemotherapy: Secondary | ICD-10-CM | POA: Diagnosis not present

## 2018-11-01 DIAGNOSIS — E039 Hypothyroidism, unspecified: Secondary | ICD-10-CM | POA: Diagnosis not present

## 2018-11-01 DIAGNOSIS — D649 Anemia, unspecified: Secondary | ICD-10-CM

## 2018-11-01 DIAGNOSIS — B1910 Unspecified viral hepatitis B without hepatic coma: Secondary | ICD-10-CM | POA: Diagnosis not present

## 2018-11-01 DIAGNOSIS — C9190 Lymphoid leukemia, unspecified not having achieved remission: Secondary | ICD-10-CM | POA: Diagnosis not present

## 2018-11-01 DIAGNOSIS — D801 Nonfamilial hypogammaglobulinemia: Secondary | ICD-10-CM | POA: Diagnosis not present

## 2018-11-01 DIAGNOSIS — Z8701 Personal history of pneumonia (recurrent): Secondary | ICD-10-CM | POA: Diagnosis not present

## 2018-11-01 LAB — CBC WITH DIFFERENTIAL (CANCER CENTER ONLY)
Abs Immature Granulocytes: 0.02 10*3/uL (ref 0.00–0.07)
Basophils Absolute: 0.1 10*3/uL (ref 0.0–0.1)
Basophils Relative: 0 %
Eosinophils Absolute: 0.1 10*3/uL (ref 0.0–0.5)
Eosinophils Relative: 0 %
HCT: 28.2 % — ABNORMAL LOW (ref 36.0–46.0)
Hemoglobin: 9.1 g/dL — ABNORMAL LOW (ref 12.0–15.0)
Immature Granulocytes: 0 %
Lymphocytes Relative: 96 %
Lymphs Abs: 37.6 10*3/uL — ABNORMAL HIGH (ref 0.7–4.0)
MCH: 29.3 pg (ref 26.0–34.0)
MCHC: 32.3 g/dL (ref 30.0–36.0)
MCV: 90.7 fL (ref 80.0–100.0)
Monocytes Absolute: 0.2 10*3/uL (ref 0.1–1.0)
Monocytes Relative: 0 %
Neutro Abs: 1.7 10*3/uL (ref 1.7–7.7)
Neutrophils Relative %: 4 %
Platelet Count: 37 10*3/uL — ABNORMAL LOW (ref 150–400)
RBC: 3.11 MIL/uL — ABNORMAL LOW (ref 3.87–5.11)
RDW: 13.2 % (ref 11.5–15.5)
WBC Count: 39.6 10*3/uL — ABNORMAL HIGH (ref 4.0–10.5)
nRBC: 0 % (ref 0.0–0.2)

## 2018-11-01 LAB — CMP (CANCER CENTER ONLY)
ALT: 18 U/L (ref 0–44)
AST: 18 U/L (ref 15–41)
Albumin: 4.5 g/dL (ref 3.5–5.0)
Alkaline Phosphatase: 84 U/L (ref 38–126)
Anion gap: 9 (ref 5–15)
BUN: 22 mg/dL — ABNORMAL HIGH (ref 6–20)
CO2: 26 mmol/L (ref 22–32)
Calcium: 9.7 mg/dL (ref 8.9–10.3)
Chloride: 105 mmol/L (ref 98–111)
Creatinine: 0.83 mg/dL (ref 0.44–1.00)
GFR, Est AFR Am: >60 mL/min (ref >60)
GFR, Estimated: >60 mL/min (ref >60)
Glucose, Bld: 86 mg/dL (ref 70–99)
Potassium: 4.6 mmol/L (ref 3.5–5.1)
Sodium: 140 mmol/L (ref 135–145)
Total Bilirubin: 0.5 mg/dL (ref 0.3–1.2)
Total Protein: 6.6 g/dL (ref 6.5–8.1)

## 2018-11-01 LAB — SAMPLE TO BLOOD BANK

## 2018-11-01 NOTE — Telephone Encounter (Signed)
Notified of CBC results. Call for bleeding. Confirmed WebEx visit on 4/14 with Dr. Benay Spice at 12:00

## 2018-11-05 ENCOUNTER — Telehealth: Payer: Self-pay | Admitting: Oncology

## 2018-11-05 ENCOUNTER — Other Ambulatory Visit: Payer: Self-pay | Admitting: *Deleted

## 2018-11-05 ENCOUNTER — Other Ambulatory Visit: Payer: Self-pay | Admitting: Psychiatry

## 2018-11-05 ENCOUNTER — Inpatient Hospital Stay (HOSPITAL_BASED_OUTPATIENT_CLINIC_OR_DEPARTMENT_OTHER): Payer: BLUE CROSS/BLUE SHIELD | Admitting: Oncology

## 2018-11-05 DIAGNOSIS — C9110 Chronic lymphocytic leukemia of B-cell type not having achieved remission: Secondary | ICD-10-CM

## 2018-11-05 DIAGNOSIS — Z79899 Other long term (current) drug therapy: Secondary | ICD-10-CM

## 2018-11-05 DIAGNOSIS — E039 Hypothyroidism, unspecified: Secondary | ICD-10-CM | POA: Diagnosis not present

## 2018-11-05 DIAGNOSIS — D801 Nonfamilial hypogammaglobulinemia: Secondary | ICD-10-CM

## 2018-11-05 DIAGNOSIS — D6489 Other specified anemias: Secondary | ICD-10-CM | POA: Diagnosis not present

## 2018-11-05 DIAGNOSIS — D649 Anemia, unspecified: Secondary | ICD-10-CM

## 2018-11-05 NOTE — Progress Notes (Signed)
Transfusion orders entered for 11/06/18. Will release when CBC is resulted on 11/06/18.

## 2018-11-05 NOTE — Progress Notes (Signed)
Patient requesting lab and transfusion tomorrow. [facility] is full. High priority scheduling message sent for lab/transfusion at [facility] tomorrow.

## 2018-11-05 NOTE — Progress Notes (Signed)
[facility] OFFICE VISIT PROGRESS NOTE  I connected with@ on 11/05/18 at 12:00 PM EDT by video conference and verified that I am speaking with the correct person.    Other persons participating in the visit and their role in the encounter: Husband  Patient's location: Home Provider's location: Office   Diagnosis: CLL, anemia  INTERVAL HISTORY:   Ms. was transfused 1 unit of packed red blood cells after the hemoglobin [place] was transfused with 1 unit of packed red blood cells on 10/29/2018 after the hemoglobin returned at 6.9.  She had experienced exertional dyspnea and tachycardia for several days prior to the red cell transfusion.  This improved partially after the transfusion, but she continues to have exertional dyspnea.  No fever, cough, night sweats, anorexia, or palpable lymph nodes.  No chest pain or diaphoresis.   She return for a repeat CBC on 11/01/2018 and the hemoglobin is up to 9.1, but she reports she continued to have dyspnea that day.  Her friends and family have noticed she is dyspneic.   Lab Results:  Lab Results  Component Value Date   WBC 39.6 (H) 11/01/2018   HGB 9.1 (L) 11/01/2018   HCT 28.2 (L) 11/01/2018   MCV 90.7 11/01/2018   PLT 37 (L) 11/01/2018   NEUTROABS 1.7 11/01/2018     Medications: I have reviewed the patient's current medications.  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 Mild spinal megaly.  Ultrasound-guided biopsy of the left inguinal lymph node 09/18/2017, slightly "purulent "fluid aspirated, core biopsy is consistent with an atypical lymphoid proliferation-extensive necrosis with  surrounding epithelioid histiocytes, limited intact lymphoid tissue involved with CLL  Incisional biopsy of a necrotic/purulent left inguinal lymph node on 10/01/2017-extensive necrosis with granulomatous inflammation, small amount of viable lymphoid tissue involved with CLL, AFB and fungal stains negative  Peripheral blood FISH analysis 02/05/2018- deletion 13q14, no evidence of p53 (17p13) deletion, no evidence of 11q22deletion  Bone marrow biopsy 02/26/2018-hypercellular marrow with extensive involvement by CLL, lymphocytes represent85% of all cells  Ibrutinib initiated 04/03/2018  Ibrutinib placed on hold 04/11/2018 due to onset of arthralgias  Ibrutinib resumed 04/16/2018, discontinued 04/25/2018 secondary to severe arthralgias/arthritis  Ibrutinib resumed at a dose of 140 mg daily 05/03/2018  Ibrutinib dose adjusted to 140 mg alternating with 275m 06/25/2018  Ibrutinib discontinued 07/03/2018 secondary to severe arthralgias  Acalabrutinib 08/16/2018 2.Hypothyroidism 3.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 Augmentin prescribed. 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 NWhisnanthas CLL with severe associated anemia.  She has been maintained on Acalabrutinib for greater than 2 months.  The anemia has not improved.  She has persistent mild neutropenia and moderate thrombocytopenia.  There is persistent lymphocytosis.  I discussed treatment options with Ms. N[place]guillaand her husband.  She understands it is still possible the anemia will improve with continuation of acalabrutinib.  I communicated with Dr. CPrince Solian  The plan is to continue with  acalabrutinib for now.  We will consider a repeat bone marrow biopsy and changing to Venetoclax  if the anemia has not improved over the next month.  She will return for a CBC tomorrow.  We will arrange for red cell transfusion support as needed.  We will schedule a lab and office visit for within the next 1-2 weeks.  I discussed the assessment and treatment plan with the patient. The patient was provided an opportunity to ask questions and all were answered. The patient agreed with the plan and demonstrated an understanding of the instructions.   I provided 25 minutes of video/telephone consultation time during this encounter, and > 50% was spent counseling as documented under my assessment & plan.  Betsy Coder ANP/GNP-BC   11/05/2018 12:00 PM

## 2018-11-05 NOTE — Telephone Encounter (Signed)
Scheduled appt per 41/14 sch message- pt aware of appt date and time

## 2018-11-06 ENCOUNTER — Other Ambulatory Visit: Payer: Self-pay

## 2018-11-06 ENCOUNTER — Other Ambulatory Visit: Payer: Self-pay | Admitting: *Deleted

## 2018-11-06 ENCOUNTER — Inpatient Hospital Stay: Payer: BLUE CROSS/BLUE SHIELD

## 2018-11-06 ENCOUNTER — Telehealth: Payer: Self-pay | Admitting: Oncology

## 2018-11-06 ENCOUNTER — Other Ambulatory Visit: Payer: Self-pay | Admitting: Pharmacist

## 2018-11-06 DIAGNOSIS — C9190 Lymphoid leukemia, unspecified not having achieved remission: Secondary | ICD-10-CM | POA: Diagnosis not present

## 2018-11-06 DIAGNOSIS — C9110 Chronic lymphocytic leukemia of B-cell type not having achieved remission: Secondary | ICD-10-CM

## 2018-11-06 DIAGNOSIS — D649 Anemia, unspecified: Secondary | ICD-10-CM

## 2018-11-06 DIAGNOSIS — Z8701 Personal history of pneumonia (recurrent): Secondary | ICD-10-CM | POA: Diagnosis not present

## 2018-11-06 DIAGNOSIS — D630 Anemia in neoplastic disease: Secondary | ICD-10-CM | POA: Diagnosis not present

## 2018-11-06 DIAGNOSIS — D801 Nonfamilial hypogammaglobulinemia: Secondary | ICD-10-CM | POA: Diagnosis not present

## 2018-11-06 DIAGNOSIS — E039 Hypothyroidism, unspecified: Secondary | ICD-10-CM | POA: Diagnosis not present

## 2018-11-06 DIAGNOSIS — Z9221 Personal history of antineoplastic chemotherapy: Secondary | ICD-10-CM | POA: Diagnosis not present

## 2018-11-06 DIAGNOSIS — B1910 Unspecified viral hepatitis B without hepatic coma: Secondary | ICD-10-CM | POA: Diagnosis not present

## 2018-11-06 LAB — RETICULOCYTES
Immature Retic Fract: 0 % — ABNORMAL LOW (ref 2.3–15.9)
RBC.: 2.5 MIL/uL — ABNORMAL LOW (ref 3.87–5.11)
Retic Count, Absolute: 1.5 10*3/uL — ABNORMAL LOW (ref 19.0–186.0)
Retic Ct Pct: 0.1 % — ABNORMAL LOW (ref 0.4–3.1)

## 2018-11-06 LAB — CBC WITH DIFFERENTIAL (CANCER CENTER ONLY)
Abs Immature Granulocytes: 0.01 10*3/uL (ref 0.00–0.07)
Basophils Absolute: 0 10*3/uL (ref 0.0–0.1)
Basophils Relative: 0 %
Eosinophils Absolute: 0.1 10*3/uL (ref 0.0–0.5)
Eosinophils Relative: 0 %
HCT: 22.9 % — ABNORMAL LOW (ref 36.0–46.0)
Hemoglobin: 7.3 g/dL — ABNORMAL LOW (ref 12.0–15.0)
Immature Granulocytes: 0 %
Lymphocytes Relative: 95 %
Lymphs Abs: 27.3 10*3/uL — ABNORMAL HIGH (ref 0.7–4.0)
MCH: 29.2 pg (ref 26.0–34.0)
MCHC: 31.9 g/dL (ref 30.0–36.0)
MCV: 91.6 fL (ref 80.0–100.0)
Monocytes Absolute: 0.1 10*3/uL (ref 0.1–1.0)
Monocytes Relative: 0 %
Neutro Abs: 1.4 10*3/uL — ABNORMAL LOW (ref 1.7–7.7)
Neutrophils Relative %: 5 %
Platelet Count: 26 10*3/uL — ABNORMAL LOW (ref 150–400)
RBC: 2.5 MIL/uL — ABNORMAL LOW (ref 3.87–5.11)
RDW: 13 % (ref 11.5–15.5)
WBC Count: 28.9 10*3/uL — ABNORMAL HIGH (ref 4.0–10.5)
nRBC: 0 % (ref 0.0–0.2)

## 2018-11-06 LAB — PREPARE RBC (CROSSMATCH)

## 2018-11-06 LAB — SAMPLE TO BLOOD BANK

## 2018-11-06 MED ORDER — DIPHENHYDRAMINE HCL 25 MG PO CAPS: 25.0000 mg | ORAL_CAPSULE | Freq: Once | ORAL | Status: DC

## 2018-11-06 MED ORDER — SODIUM CHLORIDE 0.9% IV SOLUTION
250.0000 mL | Freq: Once | INTRAVENOUS | Status: AC
  Administered 2018-11-06: 250 mL via INTRAVENOUS
  Filled 2018-11-06: qty 250

## 2018-11-06 MED ORDER — ACALABRUTINIB 100 MG PO CAPS: 1.0000 | ORAL_CAPSULE | Freq: Two times a day (BID) | ORAL | 0 refills | Status: DC

## 2018-11-06 MED ORDER — ACETAMINOPHEN 325 MG PO TABS: 650.0000 mg | ORAL_TABLET | Freq: Once | ORAL | Status: DC

## 2018-11-06 NOTE — Patient Instructions (Signed)
Blood Transfusion, Care After This sheet gives you information about how to care for yourself after your procedure. Your doctor may also give you more specific instructions. If you have problems or questions, contact your doctor. Follow these instructions at home:  Take over-the-counter and prescription medicines only as told by your doctor.  Go back to your normal activities as told by your doctor.  Follow instructions from your doctor about how to take care of the area where an IV tube was put into your vein (insertion site). Make sure you: ? Wash your hands with soap and water before you change your bandage (dressing). If there is no soap and water, use hand sanitizer. ? Change your bandage as told by your doctor.  Check your IV insertion site every day for signs of infection. Check for: ? More redness, swelling, or pain. ? More fluid or blood. ? Warmth. ? Pus or a bad smell. Contact a doctor if:  You have more redness, swelling, or pain around the IV insertion site..  You have more fluid or blood coming from the IV insertion site.  Your IV insertion site feels warm to the touch.  You have pus or a bad smell coming from the IV insertion site.  Your pee (urine) turns pink, red, or brown.  You feel weak after doing your normal activities. Get help right away if:  You have signs of a serious allergic or body defense (immune) system reaction, including: ? Itchiness. ? Hives. ? Trouble breathing. ? Anxiety. ? Pain in your chest or lower back. ? Fever, flushing, and chills. ? Fast pulse. ? Rash. ? Watery poop (diarrhea). ? Throwing up (vomiting). ? Dark pee. ? Serious headache. ? Dizziness. ? Stiff neck. ? Yellow color in your face or the white parts of your eyes (jaundice). Summary  After a blood transfusion, return to your normal activities as told by your doctor.  Every day, check for signs of infection where the IV tube was put into your vein.  Some signs of  infection are warm skin, more redness and pain, more fluid or blood, and pus or a bad smell where the needle went in.  Contact your doctor if you feel weak or have any unusual symptoms. This information is not intended to replace advice given to you by your health care provider. Make sure you discuss any questions you have with your health care provider. Document Released: 07/31/2014 Document Revised: 03/03/2016 Document Reviewed: 03/03/2016 Elsevier Interactive Patient Education  2017 [place] (COVID-19) Are you at risk?  Are you at risk for the Coronavirus (COVID-19)?  To be considered HIGH RISK for Coronavirus (COVID-19), you have to meet the following criteria:  . Traveled to [place], [place], [place], [place] or [place]; or in the [place] to [place], [place], [place], or [place]; and have fever, cough, and shortness of breath within the last 2 weeks of travel OR . Been in close contact with a person diagnosed with COVID-19 within the last 2 weeks and have fever, cough, and shortness of breath . IF YOU DO NOT MEET THESE CRITERIA, YOU ARE CONSIDERED LOW RISK FOR COVID-19.  What to do if you are HIGH RISK for COVID-19?  [name] If you are having a medical emergency, call 911. . Seek medical care right away. Before you go to a doctor's office, urgent care or emergency department, call ahead and tell them about your recent travel, contact with someone diagnosed with COVID-19, and your  symptoms. You should receive instructions from your physician's office regarding next steps of care.  . When you arrive at healthcare provider, tell the healthcare staff immediately you have returned from visiting [place], [place], [place], [place] or [place]; or traveled in the [place] to [place], [place], [place], or [place]; in the last two weeks or you have been in close contact with a person diagnosed with COVID-19 in the last 2 weeks.   . Tell the health care staff about  your symptoms: fever, cough and shortness of breath. . After you have been seen by a medical provider, you will be either: o Tested for (COVID-19) and discharged home on quarantine except to seek medical care if symptoms worsen, and asked to  - Stay home and avoid contact with others until you get your results (4-5 days)  - Avoid travel on public transportation if possible (such as bus, train, or airplane) or o Sent to the Emergency Department by EMS for evaluation, COVID-19 testing, and possible admission depending on your condition and test results.  What to do if you are LOW RISK for COVID-19?  Reduce your risk of any infection by using the same precautions used for avoiding the common cold or flu:  [name] Wash your hands often with soap and warm water for at least 20 seconds.  If soap and water are not readily available, use an alcohol-based hand sanitizer with at least 60% alcohol.  . If coughing or sneezing, cover your mouth and nose by coughing or sneezing into the elbow areas of your shirt or coat, into a tissue or into your sleeve (not your hands). . Avoid shaking hands with others and consider head nods or verbal greetings only. . Avoid touching your eyes, nose, or mouth with unwashed hands.  . Avoid close contact with people who are sick. . Avoid places or events with large numbers of people in one location, like concerts or sporting events. . Carefully consider travel plans you have or are making. . If you are planning any travel outside or inside the [place], visit the CDC's Travelers' Health webpage for the latest health notices. . If you have some symptoms but not all symptoms, continue to monitor at home and seek medical attention if your symptoms worsen. . If you are having a medical emergency, call 911.   [place] / e-Visit: [email or web address]         [facility]: [facility]: [phone]                   [facility]: [phone]

## 2018-11-06 NOTE — Telephone Encounter (Signed)
Last fill 03/02

## 2018-11-06 NOTE — Progress Notes (Signed)
Hgb today 7.3. MD called blood bank and informed them of how symptomatic she is and they agreed to allow 1 unit of blood today.

## 2018-11-06 NOTE — Telephone Encounter (Signed)
Scheduled appt per 4/14 los. °

## 2018-11-06 NOTE — Progress Notes (Signed)
Per Dr. Benay Spice: Cancel lab on 4/17 and reschedule for 11/12/18. Patient notified.

## 2018-11-07 LAB — BPAM RBC
Blood Product Expiration Date: 202004232359
ISSUE DATE / TIME: 202004150919
Unit Type and Rh: 5100

## 2018-11-07 LAB — TYPE AND SCREEN
ABO/RH(D): O POS
Antibody Screen: NEGATIVE
Unit division: 0

## 2018-11-08 ENCOUNTER — Ambulatory Visit: Payer: BLUE CROSS/BLUE SHIELD | Admitting: Oncology

## 2018-11-08 ENCOUNTER — Inpatient Hospital Stay: Payer: BLUE CROSS/BLUE SHIELD

## 2018-11-12 ENCOUNTER — Other Ambulatory Visit: Payer: Self-pay | Admitting: Nurse Practitioner

## 2018-11-12 ENCOUNTER — Other Ambulatory Visit: Payer: Self-pay

## 2018-11-12 ENCOUNTER — Telehealth: Payer: Self-pay

## 2018-11-12 ENCOUNTER — Telehealth: Payer: Self-pay | Admitting: Nurse Practitioner

## 2018-11-12 ENCOUNTER — Inpatient Hospital Stay: Payer: BLUE CROSS/BLUE SHIELD

## 2018-11-12 DIAGNOSIS — C9110 Chronic lymphocytic leukemia of B-cell type not having achieved remission: Secondary | ICD-10-CM

## 2018-11-12 DIAGNOSIS — Z8701 Personal history of pneumonia (recurrent): Secondary | ICD-10-CM | POA: Diagnosis not present

## 2018-11-12 DIAGNOSIS — D630 Anemia in neoplastic disease: Secondary | ICD-10-CM | POA: Diagnosis not present

## 2018-11-12 DIAGNOSIS — B1910 Unspecified viral hepatitis B without hepatic coma: Secondary | ICD-10-CM | POA: Diagnosis not present

## 2018-11-12 DIAGNOSIS — E039 Hypothyroidism, unspecified: Secondary | ICD-10-CM | POA: Diagnosis not present

## 2018-11-12 DIAGNOSIS — Z9221 Personal history of antineoplastic chemotherapy: Secondary | ICD-10-CM | POA: Diagnosis not present

## 2018-11-12 DIAGNOSIS — D801 Nonfamilial hypogammaglobulinemia: Secondary | ICD-10-CM | POA: Diagnosis not present

## 2018-11-12 DIAGNOSIS — C9190 Lymphoid leukemia, unspecified not having achieved remission: Secondary | ICD-10-CM | POA: Diagnosis not present

## 2018-11-12 LAB — CMP (CANCER CENTER ONLY)
ALT: 17 U/L (ref 0–44)
AST: 15 U/L (ref 15–41)
Albumin: 4.1 g/dL (ref 3.5–5.0)
Alkaline Phosphatase: 83 U/L (ref 38–126)
Anion gap: 8 (ref 5–15)
BUN: 18 mg/dL (ref 6–20)
CO2: 27 mmol/L (ref 22–32)
Calcium: 9.5 mg/dL (ref 8.9–10.3)
Chloride: 107 mmol/L (ref 98–111)
Creatinine: 0.82 mg/dL (ref 0.44–1.00)
GFR, Est AFR Am: >60 mL/min (ref >60)
GFR, Est Non Af Am: >60 mL/min (ref >60)
Glucose, Bld: 100 mg/dL — ABNORMAL HIGH (ref 70–99)
Potassium: 4.1 mmol/L (ref 3.5–5.1)
Sodium: 142 mmol/L (ref 135–145)
Total Bilirubin: 0.6 mg/dL (ref 0.3–1.2)
Total Protein: 5.8 g/dL — ABNORMAL LOW (ref 6.5–8.1)

## 2018-11-12 LAB — CBC WITH DIFFERENTIAL (CANCER CENTER ONLY)
Abs Immature Granulocytes: 0.02 10*3/uL (ref 0.00–0.07)
Basophils Absolute: 0 10*3/uL (ref 0.0–0.1)
Basophils Relative: 0 %
Eosinophils Absolute: 0 10*3/uL (ref 0.0–0.5)
Eosinophils Relative: 0 %
HCT: 24.8 % — ABNORMAL LOW (ref 36.0–46.0)
Hemoglobin: 8 g/dL — ABNORMAL LOW (ref 12.0–15.0)
Immature Granulocytes: 0 %
Lymphocytes Relative: 93 %
Lymphs Abs: 17 10*3/uL — ABNORMAL HIGH (ref 0.7–4.0)
MCH: 29 pg (ref 26.0–34.0)
MCHC: 32.3 g/dL (ref 30.0–36.0)
MCV: 89.9 fL (ref 80.0–100.0)
Monocytes Absolute: 0.2 10*3/uL (ref 0.1–1.0)
Monocytes Relative: 1 %
Neutro Abs: 1.2 10*3/uL — ABNORMAL LOW (ref 1.7–7.7)
Neutrophils Relative %: 6 %
Platelet Count: 20 10*3/uL — ABNORMAL LOW (ref 150–400)
RBC: 2.76 MIL/uL — ABNORMAL LOW (ref 3.87–5.11)
RDW: 13.1 % (ref 11.5–15.5)
WBC Count: 18.4 10*3/uL — ABNORMAL HIGH (ref 4.0–10.5)
nRBC: 0 % (ref 0.0–0.2)

## 2018-11-12 LAB — SAMPLE TO BLOOD BANK

## 2018-11-12 NOTE — Telephone Encounter (Signed)
I contacted Meagan Walsh and reviewed today's labs with her.  She understands to contact the office with fever, chills, other signs of infection, bleeding.  We will obtain repeat labs on 11/15/2018.

## 2018-11-12 NOTE — Telephone Encounter (Signed)
Returned TC to pt in regard to her request to know her HGB level from this morning. I let her know that her HGB was 8.0. no further problems or concerns at this time.

## 2018-11-13 ENCOUNTER — Telehealth: Payer: Self-pay | Admitting: Oncology

## 2018-11-13 NOTE — Telephone Encounter (Signed)
Scheduled appt per 4/21 sch message - unable to reach patient . Left message with appt date and time  

## 2018-11-13 NOTE — Telephone Encounter (Signed)
Patient wanted lab changed to Friday. I also sent her the join link to her email as she is already set up with [employer].

## 2018-11-14 ENCOUNTER — Other Ambulatory Visit: Payer: Self-pay | Admitting: *Deleted

## 2018-11-14 ENCOUNTER — Other Ambulatory Visit: Payer: BLUE CROSS/BLUE SHIELD

## 2018-11-14 ENCOUNTER — Telehealth: Payer: Self-pay | Admitting: *Deleted

## 2018-11-14 ENCOUNTER — Inpatient Hospital Stay: Payer: BLUE CROSS/BLUE SHIELD

## 2018-11-14 ENCOUNTER — Other Ambulatory Visit: Payer: Self-pay

## 2018-11-14 DIAGNOSIS — E039 Hypothyroidism, unspecified: Secondary | ICD-10-CM | POA: Diagnosis not present

## 2018-11-14 DIAGNOSIS — D801 Nonfamilial hypogammaglobulinemia: Secondary | ICD-10-CM | POA: Diagnosis not present

## 2018-11-14 DIAGNOSIS — Z9221 Personal history of antineoplastic chemotherapy: Secondary | ICD-10-CM | POA: Diagnosis not present

## 2018-11-14 DIAGNOSIS — C9110 Chronic lymphocytic leukemia of B-cell type not having achieved remission: Secondary | ICD-10-CM

## 2018-11-14 DIAGNOSIS — D630 Anemia in neoplastic disease: Secondary | ICD-10-CM | POA: Diagnosis not present

## 2018-11-14 DIAGNOSIS — Z8701 Personal history of pneumonia (recurrent): Secondary | ICD-10-CM | POA: Diagnosis not present

## 2018-11-14 DIAGNOSIS — C9190 Lymphoid leukemia, unspecified not having achieved remission: Secondary | ICD-10-CM | POA: Diagnosis not present

## 2018-11-14 DIAGNOSIS — B1910 Unspecified viral hepatitis B without hepatic coma: Secondary | ICD-10-CM | POA: Diagnosis not present

## 2018-11-14 DIAGNOSIS — D649 Anemia, unspecified: Secondary | ICD-10-CM

## 2018-11-14 LAB — CBC WITH DIFFERENTIAL (CANCER CENTER ONLY)
Abs Immature Granulocytes: 0.01 10*3/uL (ref 0.00–0.07)
Basophils Absolute: 0 10*3/uL (ref 0.0–0.1)
Basophils Relative: 0 %
Eosinophils Absolute: 0 10*3/uL (ref 0.0–0.5)
Eosinophils Relative: 0 %
HCT: 21.7 % — ABNORMAL LOW (ref 36.0–46.0)
Hemoglobin: 7.2 g/dL — ABNORMAL LOW (ref 12.0–15.0)
Immature Granulocytes: 0 %
Lymphocytes Relative: 92 %
Lymphs Abs: 17.8 10*3/uL — ABNORMAL HIGH (ref 0.7–4.0)
MCH: 29.9 pg (ref 26.0–34.0)
MCHC: 33.2 g/dL (ref 30.0–36.0)
MCV: 90 fL (ref 80.0–100.0)
Monocytes Absolute: 0.2 10*3/uL (ref 0.1–1.0)
Monocytes Relative: 1 %
Neutro Abs: 1.3 10*3/uL — ABNORMAL LOW (ref 1.7–7.7)
Neutrophils Relative %: 7 %
Platelet Count: 19 10*3/uL — ABNORMAL LOW (ref 150–400)
RBC: 2.41 MIL/uL — ABNORMAL LOW (ref 3.87–5.11)
RDW: 13.1 % (ref 11.5–15.5)
WBC Count: 19.4 10*3/uL — ABNORMAL HIGH (ref 4.0–10.5)
nRBC: 0 % (ref 0.0–0.2)

## 2018-11-14 LAB — SAMPLE TO BLOOD BANK

## 2018-11-14 LAB — PREPARE RBC (CROSSMATCH)

## 2018-11-14 NOTE — Telephone Encounter (Signed)
Patient called Dr. Benay Spice and is requesting a transfusion tomorrow. Patient Meagan Walsh can accommodate her at 0800 tomorrow. Called patient with appointment and that her labs need to be done today at 3:30 pm. She had also requested her husband be in on the [employer] visit tomorrow from another computer. Forwarded this request to [employer], Meagan Walsh.

## 2018-11-14 NOTE — Progress Notes (Signed)
Notified blood bank of transfusion of 1 unit blood tomorrow at 0800 at Patient Meagan Walsh. Will only allow 1 unit transfusion due to Hgb 7.2

## 2018-11-15 ENCOUNTER — Telehealth: Payer: Self-pay | Admitting: Oncology

## 2018-11-15 ENCOUNTER — Other Ambulatory Visit: Payer: BLUE CROSS/BLUE SHIELD

## 2018-11-15 ENCOUNTER — Ambulatory Visit (HOSPITAL_COMMUNITY)
Admission: RE | Admit: 2018-11-15 | Discharge: 2018-11-15 | Disposition: A | Discharge status: Home / Self Care | Payer: BLUE CROSS/BLUE SHIELD | Source: Ambulatory Visit | Attending: Oncology | Admitting: Oncology

## 2018-11-15 ENCOUNTER — Inpatient Hospital Stay (HOSPITAL_BASED_OUTPATIENT_CLINIC_OR_DEPARTMENT_OTHER): Payer: BLUE CROSS/BLUE SHIELD | Admitting: Oncology

## 2018-11-15 DIAGNOSIS — D6489 Other specified anemias: Secondary | ICD-10-CM | POA: Diagnosis not present

## 2018-11-15 DIAGNOSIS — D649 Anemia, unspecified: Secondary | ICD-10-CM

## 2018-11-15 DIAGNOSIS — C9110 Chronic lymphocytic leukemia of B-cell type not having achieved remission: Secondary | ICD-10-CM

## 2018-11-15 DIAGNOSIS — D801 Nonfamilial hypogammaglobulinemia: Secondary | ICD-10-CM | POA: Diagnosis not present

## 2018-11-15 DIAGNOSIS — Z8701 Personal history of pneumonia (recurrent): Secondary | ICD-10-CM | POA: Diagnosis not present

## 2018-11-15 DIAGNOSIS — D630 Anemia in neoplastic disease: Secondary | ICD-10-CM | POA: Diagnosis not present

## 2018-11-15 DIAGNOSIS — E039 Hypothyroidism, unspecified: Secondary | ICD-10-CM | POA: Diagnosis not present

## 2018-11-15 DIAGNOSIS — C9190 Lymphoid leukemia, unspecified not having achieved remission: Secondary | ICD-10-CM | POA: Diagnosis not present

## 2018-11-15 DIAGNOSIS — Z9221 Personal history of antineoplastic chemotherapy: Secondary | ICD-10-CM | POA: Diagnosis not present

## 2018-11-15 DIAGNOSIS — Z79899 Other long term (current) drug therapy: Secondary | ICD-10-CM

## 2018-11-15 DIAGNOSIS — B1910 Unspecified viral hepatitis B without hepatic coma: Secondary | ICD-10-CM | POA: Diagnosis not present

## 2018-11-15 MED ORDER — ACETAMINOPHEN 325 MG PO TABS: 650.0000 mg | ORAL_TABLET | Freq: Once | ORAL | Status: DC

## 2018-11-15 MED ORDER — DIPHENHYDRAMINE HCL 25 MG PO CAPS: 25.0000 mg | ORAL_CAPSULE | Freq: Once | ORAL | Status: DC

## 2018-11-15 MED ORDER — SODIUM CHLORIDE 0.9% IV SOLUTION
250.0000 mL | Freq: Once | INTRAVENOUS | Status: AC
  Administered 2018-11-15: 250 mL via INTRAVENOUS

## 2018-11-15 NOTE — Progress Notes (Signed)
[facility] OFFICE VISIT PROGRESS NOTE  I connected with Marolyn Hammock on 11/15/18 at 11:30 AM EDT by video visit and verified that I am speaking with the correct person using two identifiers.    Other persons participating in the visit and their role in the encounter: Husband  Patient's location: Home Provider's location: Office   Diagnosis: CLL  INTERVAL HISTORY:   Ms. Rease agreed to a video visit given her risk for infection and the current COVID pandemic.  I was on the video monitor.  She can see me, but I was unable to view her by video.  She and her husband were present by phone.  She continues acalabrutinib.  No rash, arthralgias, fever, night sweats, or palpable lymph nodes.  She receives intermittent red cell transfusions, most recently this morning.  She was also transfused on 11/06/2018.  She develops tachycardia and exertional dyspnea over the few days prior to each transfusion.  She feels better after the transfusion today. Ms. Koelzer reports feeling uncomfortable secondary to tachycardia last night.  Ms. Cuadrado has a good appetite and reports gaining weight.   Lab Results:  Lab Results  Component Value Date   WBC 19.4 (H) 11/14/2018   HGB 7.2 (L) 11/14/2018   HCT 21.7 (L) 11/14/2018   MCV 90.0 11/14/2018   PLT 19 (L) 11/14/2018   NEUTROABS 1.3 (L) 11/14/2018    Medications: I have reviewed the patient's current medications.  Assessment/Plan: 1. 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 Mild spinal megaly.  Ultrasound-guided biopsy of the left inguinal lymph node 09/18/2017, slightly "purulent "fluid aspirated, core biopsy is  consistent with an atypical lymphoid proliferation-extensive necrosis with surrounding epithelioid histiocytes, limited intact lymphoid tissue involved with CLL  Incisional biopsy of a necrotic/purulent left inguinal lymph node on 10/01/2017-extensive necrosis with granulomatous inflammation, small amount of viable lymphoid tissue involved with CLL, AFB and fungal stains negative  Peripheral blood FISH analysis 02/05/2018- deletion 13q14, no evidence of p53 (17p13) deletion, no evidence of 11q22deletion  Bone marrow biopsy 02/26/2018-hypercellular marrow with extensive involvement by CLL, lymphocytes represent85% of all cells  Ibrutinib initiated 04/03/2018  Ibrutinib placed on hold 04/11/2018 due to onset of arthralgias  Ibrutinib resumed 04/16/2018, discontinued 04/25/2018 secondary to severe arthralgias/arthritis  Ibrutinib resumed at a dose of 140 mg daily 05/03/2018  Ibrutinib dose adjusted to 140 mg alternating with 245m 06/25/2018  Ibrutinib discontinued 07/03/2018 secondary to severe arthralgias  Acalabrutinib 08/16/2018, discontinued 11/15/2018 secondary to persistent severe transfusion dependent anemia and neutropenia/thrombocytopenia 2.Hypothyroidism 3.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 Augmentin prescribed. 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 NFasighas chronic lymphocytic leukemia.  She has been maintained on acalabrutinib since January.  She continues to require intermittent red cell transfusions for symptomatic anemia.  She has developed progressive neutropenia and thrombocytopenia over the past few weeks.  We decided to  discontinue acalabrutinib.  It is unclear whether the persistent transfusion dependent anemia is secondary to CLL or toxicity from the acalabrutinib.  She agrees to a restaging bone marrow biopsy.  She will call for bleeding, fever, or recurrent dyspnea/tachycardia.  She will be scheduled for a lab visit, bone marrow biopsy, and transfusion as needed next week.   She will be scheduled for a video visit on 11/25/2018.  I will communicate with Dr. Prince Solian at [facility].  I discussed the assessment and treatment plan with the patient. The patient was provided an opportunity to ask questions and all were answered. The patient agreed with the plan and demonstrated an understanding of the instructions.   The patient was advised to call back or seek an in-person evaluation if the symptoms worsen or if the condition fails to improve as anticipated.  I provided 25 minutes of video/phone and documentation time during this encounter, and > 50% was spent counseling as documented under my assessment & plan.  Betsy Coder ANP/GNP-BC   11/15/2018 11:25 AM

## 2018-11-15 NOTE — Telephone Encounter (Signed)
Scheduled appt per 4/24 los. °

## 2018-11-15 NOTE — Discharge Instructions (Signed)
Blood Transfusion, Adult A blood transfusion is a procedure in which you are given blood through an IV tube. You may need this procedure because of:  Illness.  Surgery.  Injury. The blood may come from someone else (a donor). You may also be able to donate blood for yourself (autologous blood donation). The blood given in a transfusion is made up of different types of cells. You may get:  Red blood cells. These carry oxygen to the cells in the body.  White blood cells. These help you fight infections.  Platelets. These help your blood to clot.  Plasma. This is the liquid part of your blood. It helps with fluid imbalances. If you have a clotting disorder, you may also get other types of blood products. What happens before the procedure?  You will have a blood test to find out your blood type. The test also finds out what type of blood your body will accept and matches it to the donor type.  If you are going to have a planned surgery, you may be able to donate your own blood. This may be done in case you need a transfusion.  If you have had an allergic reaction to a transfusion in the past, you may be given medicine to help prevent a reaction. This medicine may be given to you by mouth or through an IV.  You will have your temperature, blood pressure, and pulse checked.  Follow instructions from your doctor about what you cannot eat or drink.  Ask your doctor about: ? Changing or stopping your regular medicines. This is important if you take diabetes medicines or blood thinners. ? Taking medicines such as aspirin and ibuprofen. These medicines can thin your blood. Do not take these medicines before your procedure if your doctor tells you not to. What happens during the procedure?  An IV tube will be put into one of your veins.  The bag of donated blood will be attached to your IV tube. Then, the blood will enter through your vein.  Your temperature, blood pressure, and pulse will  be checked regularly during the procedure. This is done to find early signs of a transfusion reaction.  If you have any signs or symptoms of a reaction, your transfusion will be stopped. You may also be given medicine.  When the transfusion is done, your IV tube will be taken out.  Pressure may be applied to the IV site for a few minutes.  A bandage (dressing) will be put on the IV site. The procedure may vary among doctors and hospitals. What happens after the procedure?  Your temperature, blood pressure, heart rate, breathing rate, and blood oxygen level will be checked often.  Your blood may be tested to see how you are responding to the transfusion.  You may be warmed with fluids or blankets. This is done to keep the temperature of your body normal. Summary  A blood transfusion is a procedure in which you are given blood through an IV tube.  The blood may come from someone else (a donor). You may also be able to donate blood for yourself.  If you have had an allergic reaction to a transfusion in the past, you may be given medicine to help prevent a reaction. This medicine may be given to you by mouth or through an IV tube.  Your temperature, blood pressure, heart rate, breathing rate, and blood oxygen level will be checked often.  Your blood may be tested to see   how you are responding to the transfusion. This information is not intended to replace advice given to you by your health care provider. Make sure you discuss any questions you have with your health care provider. Document Released: 10/06/2008 Document Revised: 03/03/2016 Document Reviewed: 03/03/2016 Elsevier Interactive Patient Education  2019 [employer].  

## 2018-11-15 NOTE — Progress Notes (Signed)
PATIENT CARE CENTER NOTE  Diagnosis: Anemia, unspecified type (D64.9); CLL (chronic lymphocytic leukemia) ([facility]) (C91.10)  Provider: Dr. Benay Spice   Procedure: 1 unit PRBC   Note: Patient received 1 unit of blood. Patient refused pre-medications (Tylenol and Benadryl). Tolerated transfusion well. Vital signs remained stable. Discharge instructions given. Patient alert, oriented and ambulatory at discharge.

## 2018-11-16 LAB — TYPE AND SCREEN
ABO/RH(D): O POS
Antibody Screen: NEGATIVE
Unit division: 0

## 2018-11-16 LAB — BPAM RBC
Blood Product Expiration Date: 202005012359
ISSUE DATE / TIME: 202004240819
Unit Type and Rh: 5100

## 2018-11-19 ENCOUNTER — Other Ambulatory Visit: Payer: Self-pay | Admitting: *Deleted

## 2018-11-19 ENCOUNTER — Inpatient Hospital Stay: Payer: BLUE CROSS/BLUE SHIELD

## 2018-11-19 ENCOUNTER — Other Ambulatory Visit: Payer: Self-pay | Admitting: Radiology

## 2018-11-19 ENCOUNTER — Telehealth: Payer: Self-pay | Admitting: Psychiatry

## 2018-11-19 ENCOUNTER — Other Ambulatory Visit: Payer: Self-pay

## 2018-11-19 DIAGNOSIS — Z8701 Personal history of pneumonia (recurrent): Secondary | ICD-10-CM | POA: Diagnosis not present

## 2018-11-19 DIAGNOSIS — D649 Anemia, unspecified: Secondary | ICD-10-CM

## 2018-11-19 DIAGNOSIS — C9110 Chronic lymphocytic leukemia of B-cell type not having achieved remission: Secondary | ICD-10-CM

## 2018-11-19 DIAGNOSIS — B1910 Unspecified viral hepatitis B without hepatic coma: Secondary | ICD-10-CM | POA: Diagnosis not present

## 2018-11-19 DIAGNOSIS — D630 Anemia in neoplastic disease: Secondary | ICD-10-CM | POA: Diagnosis not present

## 2018-11-19 DIAGNOSIS — Z9221 Personal history of antineoplastic chemotherapy: Secondary | ICD-10-CM | POA: Diagnosis not present

## 2018-11-19 DIAGNOSIS — C9190 Lymphoid leukemia, unspecified not having achieved remission: Secondary | ICD-10-CM | POA: Diagnosis not present

## 2018-11-19 DIAGNOSIS — E039 Hypothyroidism, unspecified: Secondary | ICD-10-CM | POA: Diagnosis not present

## 2018-11-19 DIAGNOSIS — D801 Nonfamilial hypogammaglobulinemia: Secondary | ICD-10-CM | POA: Diagnosis not present

## 2018-11-19 LAB — CBC WITH DIFFERENTIAL (CANCER CENTER ONLY)
Abs Immature Granulocytes: 0.01 10*3/uL (ref 0.00–0.07)
Basophils Absolute: 0 10*3/uL (ref 0.0–0.1)
Basophils Relative: 0 %
Eosinophils Absolute: 0 10*3/uL (ref 0.0–0.5)
Eosinophils Relative: 0 %
HCT: 23.2 % — ABNORMAL LOW (ref 36.0–46.0)
Hemoglobin: 7.6 g/dL — ABNORMAL LOW (ref 12.0–15.0)
Immature Granulocytes: 0 %
Lymphocytes Relative: 92 %
Lymphs Abs: 9.2 10*3/uL — ABNORMAL HIGH (ref 0.7–4.0)
MCH: 29.5 pg (ref 26.0–34.0)
MCHC: 32.8 g/dL (ref 30.0–36.0)
MCV: 89.9 fL (ref 80.0–100.0)
Monocytes Absolute: 0.1 10*3/uL (ref 0.1–1.0)
Monocytes Relative: 1 %
Neutro Abs: 0.7 10*3/uL — ABNORMAL LOW (ref 1.7–7.7)
Neutrophils Relative %: 7 %
Platelet Count: 13 10*3/uL — ABNORMAL LOW (ref 150–400)
RBC: 2.58 MIL/uL — ABNORMAL LOW (ref 3.87–5.11)
RDW: 12.8 % (ref 11.5–15.5)
WBC Count: 10.1 10*3/uL (ref 4.0–10.5)
nRBC: 0 % (ref 0.0–0.2)

## 2018-11-19 LAB — PREPARE RBC (CROSSMATCH)

## 2018-11-19 LAB — SAMPLE TO BLOOD BANK

## 2018-11-19 MED ORDER — DIPHENHYDRAMINE HCL 25 MG PO CAPS: 25.0000 mg | ORAL_CAPSULE | Freq: Once | ORAL | Status: DC

## 2018-11-19 MED ORDER — SODIUM CHLORIDE 0.9% IV SOLUTION
250.0000 mL | Freq: Once | INTRAVENOUS | Status: DC
  Filled 2018-11-19: qty 250

## 2018-11-19 MED ORDER — ACETAMINOPHEN 325 MG PO TABS: 650.0000 mg | ORAL_TABLET | Freq: Once | ORAL | Status: DC

## 2018-11-19 NOTE — Telephone Encounter (Signed)
She is having blood transfusions every week

## 2018-11-19 NOTE — Progress Notes (Signed)
MD discussion with blood bank--determined that she does not need CMV negative blood at this time.

## 2018-11-19 NOTE — Telephone Encounter (Signed)
Patient called and said that she wants dr. Clovis Pu to fax to dr brad sherrill a diagnosis and lab order for her lithium level to be drawn. The fax # is [phone]

## 2018-11-19 NOTE — Progress Notes (Signed)
Called blood bank and confirmed they see her orders and will prepare 1 unit for today since she is symptomatic with shortness of breath and heart racing.

## 2018-11-19 NOTE — Progress Notes (Signed)
Patient informed to return tomorrow for lab at 0800 in case she needs platelets.

## 2018-11-19 NOTE — Patient Instructions (Addendum)
Stop acalbrutinib Be sure to come to cancer center on 11/21/18 for CBC if not done prior to bone marrow biopsy. Dr. Benay Spice recommends you do not go out of town due to low platelets and low ANC of 0.7 Call for any sign of infection or temp > 100.5 Return on 11/20/18 at 8 am for repeat CBC--wait for results

## 2018-11-20 ENCOUNTER — Other Ambulatory Visit: Payer: Self-pay

## 2018-11-20 ENCOUNTER — Other Ambulatory Visit: Payer: Self-pay | Admitting: *Deleted

## 2018-11-20 ENCOUNTER — Inpatient Hospital Stay (HOSPITAL_BASED_OUTPATIENT_CLINIC_OR_DEPARTMENT_OTHER): Payer: BLUE CROSS/BLUE SHIELD | Admitting: Oncology

## 2018-11-20 ENCOUNTER — Inpatient Hospital Stay: Payer: BLUE CROSS/BLUE SHIELD

## 2018-11-20 ENCOUNTER — Telehealth: Payer: Self-pay | Admitting: Oncology

## 2018-11-20 DIAGNOSIS — B1910 Unspecified viral hepatitis B without hepatic coma: Secondary | ICD-10-CM | POA: Diagnosis not present

## 2018-11-20 DIAGNOSIS — Z9221 Personal history of antineoplastic chemotherapy: Secondary | ICD-10-CM | POA: Diagnosis not present

## 2018-11-20 DIAGNOSIS — C9110 Chronic lymphocytic leukemia of B-cell type not having achieved remission: Secondary | ICD-10-CM

## 2018-11-20 DIAGNOSIS — E039 Hypothyroidism, unspecified: Secondary | ICD-10-CM | POA: Diagnosis not present

## 2018-11-20 DIAGNOSIS — C9190 Lymphoid leukemia, unspecified not having achieved remission: Secondary | ICD-10-CM

## 2018-11-20 DIAGNOSIS — Z8701 Personal history of pneumonia (recurrent): Secondary | ICD-10-CM

## 2018-11-20 DIAGNOSIS — D801 Nonfamilial hypogammaglobulinemia: Secondary | ICD-10-CM | POA: Diagnosis not present

## 2018-11-20 DIAGNOSIS — D630 Anemia in neoplastic disease: Secondary | ICD-10-CM

## 2018-11-20 DIAGNOSIS — D649 Anemia, unspecified: Secondary | ICD-10-CM

## 2018-11-20 DIAGNOSIS — F331 Major depressive disorder, recurrent, moderate: Secondary | ICD-10-CM

## 2018-11-20 LAB — CMP (CANCER CENTER ONLY)
ALT: 16 U/L (ref 0–44)
AST: 15 U/L (ref 15–41)
Albumin: 4.2 g/dL (ref 3.5–5.0)
Alkaline Phosphatase: 84 U/L (ref 38–126)
Anion gap: 7 (ref 5–15)
BUN: 21 mg/dL — ABNORMAL HIGH (ref 6–20)
CO2: 27 mmol/L (ref 22–32)
Calcium: 9.4 mg/dL (ref 8.9–10.3)
Chloride: 109 mmol/L (ref 98–111)
Creatinine: 0.85 mg/dL (ref 0.44–1.00)
GFR, Est AFR Am: >60 mL/min (ref >60)
GFR, Estimated: >60 mL/min (ref >60)
Glucose, Bld: 102 mg/dL — ABNORMAL HIGH (ref 70–99)
Potassium: 4.5 mmol/L (ref 3.5–5.1)
Sodium: 143 mmol/L (ref 135–145)
Total Bilirubin: 0.4 mg/dL (ref 0.3–1.2)
Total Protein: 6 g/dL — ABNORMAL LOW (ref 6.5–8.1)

## 2018-11-20 LAB — CBC WITH DIFFERENTIAL (CANCER CENTER ONLY)
Abs Immature Granulocytes: 0.01 10*3/uL (ref 0.00–0.07)
Basophils Absolute: 0 10*3/uL (ref 0.0–0.1)
Basophils Relative: 0 %
Eosinophils Absolute: 0 10*3/uL (ref 0.0–0.5)
Eosinophils Relative: 0 %
HCT: 23.9 % — ABNORMAL LOW (ref 36.0–46.0)
Hemoglobin: 7.8 g/dL — ABNORMAL LOW (ref 12.0–15.0)
Immature Granulocytes: 0 %
Lymphocytes Relative: 92 %
Lymphs Abs: 8.4 10*3/uL — ABNORMAL HIGH (ref 0.7–4.0)
MCH: 29.5 pg (ref 26.0–34.0)
MCHC: 32.6 g/dL (ref 30.0–36.0)
MCV: 90.5 fL (ref 80.0–100.0)
Monocytes Absolute: 0.1 10*3/uL (ref 0.1–1.0)
Monocytes Relative: 1 %
Neutro Abs: 0.6 10*3/uL — ABNORMAL LOW (ref 1.7–7.7)
Neutrophils Relative %: 7 %
Platelet Count: 11 10*3/uL — ABNORMAL LOW (ref 150–400)
RBC: 2.64 MIL/uL — ABNORMAL LOW (ref 3.87–5.11)
RDW: 12.8 % (ref 11.5–15.5)
WBC Count: 9.1 10*3/uL (ref 4.0–10.5)
nRBC: 0 % (ref 0.0–0.2)

## 2018-11-20 LAB — LACTATE DEHYDROGENASE: LDH: 130 U/L (ref 98–192)

## 2018-11-20 LAB — LITHIUM LEVEL: Lithium Lvl: 0.68 mmol/L (ref 0.60–1.20)

## 2018-11-20 LAB — SAVE SMEAR(SSMR), FOR PROVIDER SLIDE REVIEW

## 2018-11-20 MED ORDER — ACETAMINOPHEN 325 MG PO TABS: 650.0000 mg | ORAL_TABLET | Freq: Once | ORAL | Status: DC

## 2018-11-20 MED ORDER — DIPHENHYDRAMINE HCL 25 MG PO CAPS
ORAL_CAPSULE | ORAL | Status: AC
  Filled 2018-11-20: qty 1

## 2018-11-20 MED ORDER — SODIUM CHLORIDE 0.9% IV SOLUTION
250.0000 mL | Freq: Once | INTRAVENOUS | Status: AC
  Administered 2018-11-20: 250 mL via INTRAVENOUS
  Filled 2018-11-20: qty 250

## 2018-11-20 MED ORDER — DIPHENHYDRAMINE HCL 25 MG PO CAPS: 25.0000 mg | ORAL_CAPSULE | Freq: Once | ORAL | Status: DC

## 2018-11-20 MED ORDER — ACETAMINOPHEN 325 MG PO TABS
ORAL_TABLET | ORAL | Status: AC
  Filled 2018-11-20: qty 2

## 2018-11-20 MED ORDER — SODIUM CHLORIDE 0.9% IV SOLUTION
250.0000 mL | Freq: Once | INTRAVENOUS | Status: AC
  Filled 2018-11-20: qty 250

## 2018-11-20 NOTE — Patient Instructions (Signed)
Blood Transfusion, Adult, Care After This sheet gives you information about how to care for yourself after your procedure. Your doctor may also give you more specific instructions. If you have problems or questions, contact your doctor. Follow these instructions at home:   Take over-the-counter and prescription medicines only as told by your doctor.  Go back to your normal activities as told by your doctor.  Follow instructions from your doctor about how to take care of the area where an IV tube was put into your vein (insertion site). Make sure you: ? Wash your hands with soap and water before you change your bandage (dressing). If there is no soap and water, use hand sanitizer. ? Change your bandage as told by your doctor.  Check your IV insertion site every day for signs of infection. Check for: ? More redness, swelling, or pain. ? More fluid or blood. ? Warmth. ? Pus or a bad smell. Contact a doctor if:  You have more redness, swelling, or pain around the IV insertion site.  You have more fluid or blood coming from the IV insertion site.  Your IV insertion site feels warm to the touch.  You have pus or a bad smell coming from the IV insertion site.  Your pee (urine) turns pink, red, or brown.  You feel weak after doing your normal activities. Get help right away if:  You have signs of a serious allergic or body defense (immune) system reaction, including: ? Itchiness. ? Hives. ? Trouble breathing. ? Anxiety. ? Pain in your chest or lower back. ? Fever, flushing, and chills. ? Fast pulse. ? Rash. ? Watery poop (diarrhea). ? Throwing up (vomiting). ? Dark pee. ? Serious headache. ? Dizziness. ? Stiff neck. ? Yellow color in your face or the white parts of your eyes (jaundice). Summary  After a blood transfusion, return to your normal activities as told by your doctor.  Every day, check for signs of infection where the IV tube was put into your vein.  Some  signs of infection are warm skin, more redness and pain, more fluid or blood, and pus or a bad smell where the needle went in.  Contact your doctor if you feel weak or have any unusual symptoms. This information is not intended to replace advice given to you by your health care provider. Make sure you discuss any questions you have with your health care provider. Document Released: 07/31/2014 Document Revised: 03/03/2016 Document Reviewed: 03/03/2016 Elsevier Interactive Patient Education  2019 Elsevier Inc.  

## 2018-11-20 NOTE — Telephone Encounter (Signed)
Scheduled appt per 4/29 los. °

## 2018-11-20 NOTE — Progress Notes (Signed)
[facility] OFFICE PROGRESS NOTE   Diagnosis: CLL, pancytopenia  INTERVAL HISTORY:   Meagan Walsh returns as scheduled.  She received a unit of packed red blood cells and a platelet transfusion earlier today.  No new complaint.  No fever or bleeding.  Tiny lymph node in the right groin, no other palpable lymph nodes.  Good appetite.  Objective:  Vital signs in last 24 hours:  There were no vitals taken for this visit.    HEENT: Few petechiae in the left buccal mucosa, no thrush, no other oral bleeding Lymphatics: 1/2 cm right inguinal node, no cervical or supraclavicular nodes.  1/2 cm mobile left axillary nod GI: Nontender, no hepatosplenomegaly Vascular: No leg edema  Skin: No petechiae, resolving ecchymosis of the left upper arm  Portacath/PICC-without erythema  Lab Results:  Lab Results  Component Value Date   WBC 9.1 11/20/2018   HGB 7.8 (L) 11/20/2018   HCT 23.9 (L) 11/20/2018   MCV 90.5 11/20/2018   PLT 11 (L) 11/20/2018   NEUTROABS 0.6 (L) 11/20/2018    CMP  Lab Results  Component Value Date   NA 143 11/20/2018   K 4.5 11/20/2018   CL 109 11/20/2018   CO2 27 11/20/2018   GLUCOSE 102 (H) 11/20/2018   BUN 21 (H) 11/20/2018   CREATININE 0.85 11/20/2018   CALCIUM 9.4 11/20/2018   PROT 6.0 (L) 11/20/2018   ALBUMIN 4.2 11/20/2018   AST 15 11/20/2018   ALT 16 11/20/2018   ALKPHOS 84 11/20/2018   BILITOT 0.4 11/20/2018   GFRNONAA >60 11/20/2018   GFRAA >60 11/20/2018   LDH 130  Blood smear: The platelets are markedly decreased in number, no platelet clumps.  The majority of the white cells are mature lymphocytes.  Decreased neutrophils.  No blasts or other young forms are present.  Few ovalocytes and teardrops.  The polychromasia is not increased.  No nucleated red cells.  Medications: I have reviewed the patient's current medications.   Assessment/Plan: 1. CLL-diagnosed in August 2010, flow cytometry consistent with CLL Enlarged  leftinguinal lymph node January 2019,smallneck/axillary nodes and palpable splenomegaly 09/11/2017  CTson 09/17/2017-3 cm necrotic appearing lymph node in the left inguinal region, borderline enlarged pelvic/retroperitoneal, chest, and axillary nodes. Mild spinal megaly.  Ultrasound-guided biopsy of the left inguinal lymph node 09/18/2017, slightly "purulent "fluid aspirated, core biopsy is consistent with an atypical lymphoid proliferation-extensive necrosis with surrounding epithelioid histiocytes, limited intact lymphoid tissue involved with CLL  Incisional biopsy of a necrotic/purulent left inguinal lymph node on 10/01/2017-extensive necrosis with granulomatous inflammation, small amount of viable lymphoid tissue involved with CLL, AFB and fungal stains negative  Peripheral blood FISH analysis 02/05/2018- deletion 13q14, no evidence of p53 (17p13) deletion, no evidence of 11q22deletion  Bone marrow biopsy 02/26/2018-hypercellular marrow with extensive involvement by CLL, lymphocytes represent85% of all cells  Ibrutinib initiated 04/03/2018  Ibrutinib placed on hold 04/11/2018 due to onset of arthralgias  Ibrutinib resumed 04/16/2018, discontinued 04/25/2018 secondary to severe arthralgias/arthritis  Ibrutinib resumed at a dose of 140 mg daily 05/03/2018  Ibrutinib dose adjusted to 140 mg alternating with 277m 06/25/2018  Ibrutinib discontinued 07/03/2018 secondary to severe arthralgias  Acalabrutinib 08/16/2018, discontinued 11/15/2018 secondary to persistent severe transfusion dependent anemia and neutropenia/thrombocytopenia, last dose 11/14/2018 2.Hypothyroidism 3.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 Augmentin prescribed. 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  She has persistent severe pancytopenia.  She knows to call for a fever or bleeding.  The pancytopenia may be related to persistent CLL involving the bone marrow or toxicity from acalabrutinib. I have communicated with Dr. Prince Solian.  The plan is to proceed with a bone marrow biopsy tomorrow.  I will discuss the case with Dr. Prince Solian on 11/22/2018 and she will see [name] for a virtual office visit next week.  [name] will return for lab visit and transfusion support as needed on 11/22/2018.  25 minutes were spent with the patient today.  The majority of the time was used for counseling and coordination of care.  Betsy Coder, MD  11/20/2018  1:11 PM

## 2018-11-20 NOTE — Telephone Encounter (Signed)
Serum lithium level order was faxed to Dr. Rodman Comp.

## 2018-11-21 ENCOUNTER — Encounter
Admit: 2018-11-21 | Discharge: 2018-11-21 | Payer: PRIVATE HEALTH INSURANCE | Attending: Hematology & Oncology | Primary: Hematology & Oncology

## 2018-11-21 ENCOUNTER — Telehealth: Payer: Self-pay | Admitting: *Deleted

## 2018-11-21 ENCOUNTER — Ambulatory Visit (HOSPITAL_COMMUNITY)
Admission: RE | Admit: 2018-11-21 | Discharge: 2018-11-21 | Disposition: A | Discharge status: Home / Self Care | Payer: BLUE CROSS/BLUE SHIELD | Source: Ambulatory Visit | Attending: Oncology | Admitting: Oncology

## 2018-11-21 ENCOUNTER — Encounter (HOSPITAL_COMMUNITY): Payer: Self-pay

## 2018-11-21 ENCOUNTER — Other Ambulatory Visit: Payer: Self-pay

## 2018-11-21 ENCOUNTER — Other Ambulatory Visit: Payer: Self-pay | Admitting: *Deleted

## 2018-11-21 ENCOUNTER — Telehealth: Payer: Self-pay | Admitting: Oncology

## 2018-11-21 DIAGNOSIS — Z803 Family history of malignant neoplasm of breast: Secondary | ICD-10-CM | POA: Insufficient documentation

## 2018-11-21 DIAGNOSIS — Z79899 Other long term (current) drug therapy: Secondary | ICD-10-CM | POA: Insufficient documentation

## 2018-11-21 DIAGNOSIS — F329 Major depressive disorder, single episode, unspecified: Secondary | ICD-10-CM | POA: Insufficient documentation

## 2018-11-21 DIAGNOSIS — D696 Thrombocytopenia, unspecified: Secondary | ICD-10-CM

## 2018-11-21 DIAGNOSIS — C9110 Chronic lymphocytic leukemia of B-cell type not having achieved remission: Secondary | ICD-10-CM | POA: Diagnosis not present

## 2018-11-21 DIAGNOSIS — D649 Anemia, unspecified: Secondary | ICD-10-CM

## 2018-11-21 DIAGNOSIS — F419 Anxiety disorder, unspecified: Secondary | ICD-10-CM | POA: Insufficient documentation

## 2018-11-21 DIAGNOSIS — M5030 Other cervical disc degeneration, unspecified cervical region: Secondary | ICD-10-CM | POA: Diagnosis not present

## 2018-11-21 DIAGNOSIS — E039 Hypothyroidism, unspecified: Secondary | ICD-10-CM | POA: Diagnosis not present

## 2018-11-21 DIAGNOSIS — Z79890 Hormone replacement therapy (postmenopausal): Secondary | ICD-10-CM | POA: Diagnosis not present

## 2018-11-21 LAB — BPAM RBC
Blood Product Expiration Date: 202005052359
ISSUE DATE / TIME: 202004290925
Unit Type and Rh: 9500

## 2018-11-21 LAB — PREPARE PLATELET PHERESIS: Unit division: 0

## 2018-11-21 LAB — TYPE AND SCREEN
ABO/RH(D): O POS
Antibody Screen: NEGATIVE
Unit division: 0

## 2018-11-21 LAB — PROTIME-INR
INR: 1.3 — ABNORMAL HIGH (ref 0.8–1.2)
Prothrombin Time: 16 seconds — ABNORMAL HIGH (ref 11.4–15.2)

## 2018-11-21 LAB — BPAM PLATELET PHERESIS
Blood Product Expiration Date: 202004302359
ISSUE DATE / TIME: 202004290926
Unit Type and Rh: 6200

## 2018-11-21 MED ORDER — SODIUM CHLORIDE 0.9 % IV SOLN
INTRAVENOUS | Status: DC
  Administered 2018-11-21: 08:00:00 via INTRAVENOUS

## 2018-11-21 MED ORDER — FENTANYL CITRATE (PF) 100 MCG/2ML IJ SOLN
INTRAMUSCULAR | Status: AC
  Filled 2018-11-21: qty 2

## 2018-11-21 MED ORDER — FENTANYL CITRATE (PF) 100 MCG/2ML IJ SOLN
INTRAMUSCULAR | Status: AC | PRN
  Administered 2018-11-21 (×2): 50 ug via INTRAVENOUS

## 2018-11-21 MED ORDER — MIDAZOLAM HCL 2 MG/2ML IJ SOLN
INTRAMUSCULAR | Status: AC
  Filled 2018-11-21: qty 4

## 2018-11-21 MED ORDER — MIDAZOLAM HCL 2 MG/2ML IJ SOLN
INTRAMUSCULAR | Status: AC | PRN
  Administered 2018-11-21 (×3): 1 mg via INTRAVENOUS

## 2018-11-21 NOTE — Telephone Encounter (Signed)
Notified husband that platelet count is 10,000 today. She needs platelet transfusion today or tomorrow and then 30 minute post transfusion recheck of counts. Husband reports she is not having any bleeding. They prefer to have transfusion tomorrow. High priority scheduling message sent with note to call husband with appointment. Confirmed with blood bank they have platelets on site.

## 2018-11-21 NOTE — Discharge Instructions (Signed)
Leave dressing in place for 24 hours.  May remove at 1000 11/22/18,Friday.  After removal, pt may shower.  Patient does not have to put another dressing on unless pt wants to for comfort.    Bone Marrow Aspiration and Bone Marrow Biopsy, Adult, Care After This sheet gives you information about how to care for yourself after your procedure. Your health care provider may also give you more specific instructions. If you have problems or questions, contact your health care provider. What can I expect after the procedure? After the procedure, it is common to have:  Mild pain and tenderness.  Swelling.  Bruising. Follow these instructions at home: Puncture site care      Follow instructions from your health care provider about how to take care of the puncture site. Make sure you: ? Wash your hands with soap and water before you change your bandage (dressing). If soap and water are not available, use hand sanitizer. ? Change your dressing as told by your health care provider.  Check your puncture siteevery day for signs of infection. Check for: ? More redness, swelling, or pain. ? More fluid or blood. ? Warmth. ? Pus or a bad smell. General instructions  Take over-the-counter and prescription medicines only as told by your health care provider.  Do not take baths, swim, or use a hot tub until your health care provider approves. Ask if you can take a shower or have a sponge bath.  Return to your normal activities as told by your health care provider. Ask your health care provider what activities are safe for you.  Do not drive for 24 hours if you were given a medicine to help you relax (sedative) during your procedure.  Keep all follow-up visits as told by your health care provider. This is important. Contact a health care provider if:  Your pain is not controlled with medicine. Get help right away if:  You have a fever.  You have more redness, swelling, or pain around the  puncture site.  You have more fluid or blood coming from the puncture site.  Your puncture site feels warm to the touch.  You have pus or a bad smell coming from the puncture site. These symptoms may represent a serious problem that is an emergency. Do not wait to see if the symptoms will go away. Get medical help right away. Call your local emergency services (911 in the [place] Do not drive yourself to the hospital. Summary  After the procedure, it is common to have mild pain, tenderness, swelling, and bruising.  Follow instructions from your health care provider about how to take care of the puncture site.  Get help right away if you have any symptoms of infection or if you have more blood or fluid coming from the puncture site. This information is not intended to replace advice given to you by your health care provider. Make sure you discuss any questions you have with your health care provider. Document Released: 01/27/2005 Document Revised: 10/23/2017 Document Reviewed: 12/22/2015 Elsevier Interactive Patient Education  2019 [employer].  Moderate Conscious Sedation, Adult, Care After These instructions provide you with information about caring for yourself after your procedure. Your health care provider may also give you more specific instructions. Your treatment has been planned according to current medical practices, but problems sometimes occur. Call your health care provider if you have any problems or questions after your procedure. What can I expect after the procedure? After your procedure, it  is common:  To feel sleepy for several hours.  To feel clumsy and have poor balance for several hours.  To have poor judgment for several hours.  To vomit if you eat too soon. Follow these instructions at home: For at least 24 hours after the procedure:   Do not: ? Participate in activities where you could fall or become injured. ? Drive. ? Use heavy machinery. ? Drink  alcohol. ? Take sleeping pills or medicines that cause drowsiness. ? Make important decisions or sign legal documents. ? Take care of children on your own.  Rest. Eating and drinking  Follow the diet recommended by your health care provider.  If you vomit: ? Drink water, juice, or soup when you can drink without vomiting. ? Make sure you have little or no nausea before eating solid foods. General instructions  Have a responsible adult stay with you until you are awake and alert.  Take over-the-counter and prescription medicines only as told by your health care provider.  If you smoke, do not smoke without supervision.  Keep all follow-up visits as told by your health care provider. This is important. Contact a health care provider if:  You keep feeling nauseous or you keep vomiting.  You feel light-headed.  You develop a rash.  You have a fever. Get help right away if:  You have trouble breathing. This information is not intended to replace advice given to you by your health care provider. Make sure you discuss any questions you have with your health care provider. Document Released: 04/30/2013 Document Revised: 12/13/2015 Document Reviewed: 10/30/2015 Elsevier Interactive Patient Education  2019 [employer].

## 2018-11-21 NOTE — Progress Notes (Signed)
Spoke with husband Aaron Edelman, confirmed he was pt's ride home today.  Gave him an estimated d/c time and informed him we would call him when the pt was done with her procedure with a more accurate d/c time and instructions. Husband voiced understanding.

## 2018-11-21 NOTE — Procedures (Signed)
Interventional Radiology Procedure Note  Procedure: CT guided aspirate and core biopsy of right iliac bone Complications: None Recommendations: - Bedrest supine x 1 hrs - Hydrocodone PRN  Pain - Follow biopsy results  Signed,  Heath K. McCullough, MD   

## 2018-11-21 NOTE — Progress Notes (Signed)
Notified blood bank that she will need a platelet in house for 11/22/18

## 2018-11-21 NOTE — Telephone Encounter (Signed)
Left VM (s) that she does not want to do labs before platelets tomorrow if possible. Also does not have an armband. She is asking where she goes for the transfusion tomorrow? Asking why she has lab on 11/25/18 and wants to do her visit with Dr. Benay Spice that day on WebEx. Thought she was having the WebEx on 11/26/18. Called back and left VM that OK to not have lab prior per her request on 11/22/18, but must have lab 30 minutes after platelets to see what kind of a bump in counts she got and [facility] will not do this, so she must come here. Also informed her she does not need a blood bank armband for platelets and she most likely will not need blood tomorrow since her Hgb today was 9.0. She does not have an appointment thus far on 11/26/18. She is scheduled for lab on 11/25/18 at [place] and MD WebEx visit with her at 1100.

## 2018-11-21 NOTE — Progress Notes (Addendum)
CRITICAL VALUE ALERT  Critical Value:  10 platelets  Date & Time Notied:  0830 4/30  Provider Notified: PA Myriam Jacobson  Orders Received/Actions taken: no new orders, platelets were 11 yesterday 4/29.

## 2018-11-21 NOTE — Telephone Encounter (Signed)
Scheduled appt per 4/30 sch message - pt is aware of appt date and time   

## 2018-11-21 NOTE — H&P (Signed)
Chief Complaint: Patient was seen in consultation today for CLL  Referring Physician(s): Betsy Coder B  Supervising Physician: Jacqulynn Cadet  Patient Status: [facility] - Out-pt  History of Present Illness: Meagan Walsh is a 58 y.o. female with past medical history of MVP, hypothyroidism, and CLL who is known to radiology from recent biopsies presents to [facility] today for repeat bone marrow biopsy.  Patient last underwent bone marrow biopsy 08/05/18.  Request is made for additional sample for ongoing treatment planning.   Patient presents today in her usual state of health.  She has no complaints.  Denies new symptoms.  She has been NPO.  She does not take blood thinners.   Past Medical History:  Diagnosis Date  . Anxiety   . Cancer ([place])    CLL  . Degenerative disk disease    Neck--chronic pain  . Depression   . Fibroids    Uterine  . 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 Hypothyroidism   . Lymphocytosis   . Mitral valve prolapse   . Mitral valve prolapse   . Thyroid disease    Hypothyroid    Past Surgical History:  Procedure Laterality Date  . BREAST BIOPSY Left 2000  . CESAREAN SECTION  1996  . MELANOMA EXCISION WITH SENTINEL LYMPH NODE BIOPSY Left 10/01/2017   Procedure: EXCISIONAL BIOPSY OF LEFT INGUINAL LYMPH NODE;  Surgeon: Greer Pickerel, MD;  Location: [facility];  Service: General;  Laterality: Left;  . REVISION OF ABDOMINAL SCAR    . TONSILLECTOMY     age 35    Allergies: Patient has no known allergies.  Medications: Prior to Admission medications   Medication Sig Start Date End Date Taking? Authorizing Provider  acetaminophen (TYLENOL) 325 MG tablet Take 650 mg by mouth every 6 (six) hours as needed.   Yes [provider]  lamoTRIgine (LAMICTAL) 200 MG tablet TAKE 1 TABLET BY MOUTH TWICE A DAY 10/04/18  Yes Cottle, Billey Co., MD  levothyroxine (SYNTHROID, LEVOTHROID) 100 MCG tablet Take 100 mcg by mouth  daily.   Yes [provider]  lithium carbonate (LITHOBID) 300 MG CR tablet Take 2 tablets (600 mg total) by mouth at bedtime. 10/07/18  Yes Cottle, Billey Co., MD  vortioxetine HBr (TRINTELLIX) 20 MG TABS tablet Take 1 tablet (20 mg total) by mouth daily. 10/10/18  Yes Cottle, Billey Co., MD  zolpidem (AMBIEN) 10 MG tablet TAKE 1/2 TO 1 TABLET BY MOUTH AS NEEDED FOR INSOMNIA. 11/06/18  Yes Cottle, Billey Co., MD  ALPRAZolam Duanne Moron) 0.25 MG tablet TAKE 1 TO 2 TABLETS BY MOUTH EVERY DAY AS NEEDED FOR ANXIETY 09/11/18   Cottle, Billey Co., MD  ciprofloxacin (CIPRO) 500 MG tablet Take 1 tablet (500 mg total) by mouth 2 (two) times daily. Start for sign/symptom of infection or temp > 101 Patient not taking: Reported on 07/29/2018 07/15/18   Ladell Pier, MD  HYDROcodone-acetaminophen (NORCO/VICODIN) 5-325 MG tablet Take 1 tablet by mouth every 4 (four) hours as needed for moderate pain. Patient not taking: Reported on 07/29/2018 07/04/18   Ladell Pier, MD  levofloxacin (LEVAQUIN) 500 MG tablet Take 1 tablet (500 mg total) by mouth daily. X 5 days 10/08/18   Ladell Pier, MD  levonorgestrel [facility]) 20 MCG/24HR IUD 1 each by Intrauterine route once.     [provider]  oxyCODONE-acetaminophen (PERCOCET/ROXICET) 5-325 MG tablet Take 1 tablet by mouth every 6 (  six) hours as needed for severe pain. 07/05/18   Owens Shark, NP  traMADol (ULTRAM) 50 MG tablet TAKE 1 TABLET BY MOUTH EVERY 8 HOURS AS NEEDED 07/03/18   Ladell Pier, MD     Family History  Problem Relation Age of Onset  . Breast cancer Mother     Social History   Socioeconomic History  . Marital status: Married    Spouse name: Not on file  . Number of children: Not on file  . Years of education: Not on file  . Highest education level: Not on file  Occupational History  . Not on file  Social Needs  . Financial resource strain: Not on file  . Food insecurity:    Worry: Not on file    Inability: Not  on file  . Transportation needs:    Medical: Not on file    Non-medical: Not on file  Tobacco Use  . Smoking status: Never Smoker  . Smokeless tobacco: Never Used  Substance and Sexual Activity  . Alcohol use: Yes    Frequency: Never    Comment: 2 glasses of wine 2 times per week   . Drug use: No  . Sexual activity: Not on file  Lifestyle  . Physical activity:    Days per week: Not on file    Minutes per session: Not on file  . Stress: Not on file  Relationships  . Social connections:    Talks on phone: Not on file    Gets together: Not on file    Attends religious service: Not on file    Active member of club or organization: Not on file    Attends meetings of clubs or organizations: Not on file    Relationship status: Not on file  Other Topics Concern  . Not on file  Social History Narrative   Pt lives in 2 story home with her husband   Has 3 adult children   Masters degree   Works at [employer]: A 12 point ROS discussed and pertinent positives are indicated in the HPI above.  All other systems are negative.  Review of Systems  Constitutional: Negative for fatigue and fever.  Respiratory: Negative for cough and shortness of breath.   Cardiovascular: Negative for chest pain.  Gastrointestinal: Negative for abdominal pain, diarrhea, nausea and vomiting.  Musculoskeletal: Negative for back pain.  Psychiatric/Behavioral: Negative for behavioral problems and confusion.    Vital Signs: BP 108/72 (BP Location: Right Arm)   Pulse 73   Temp 98.2 F (36.8 C) (Oral)   Resp 18   SpO2 100%   Physical Exam Vitals signs and nursing note reviewed.  Cardiovascular:     Rate and Rhythm: Normal rate and regular rhythm.  Pulmonary:     Effort: Pulmonary effort is normal.     Breath sounds: Normal breath sounds.  Skin:    General: Skin is warm and dry.  Neurological:     General: No focal deficit present.     Mental Status: She is alert and  oriented to person, place, and time.  Psychiatric:        Mood and Affect: Mood normal.        Behavior: Behavior normal.        Thought Content: Thought content normal.        Judgment: Judgment normal.      MD Evaluation Airway: WNL Heart: WNL Abdomen: WNL Chest/ Lungs: WNL  ASA  Classification: 3 Mallampati/Airway Score: One   Imaging: No results found.  Labs:  CBC: Recent Labs    11/14/18 1545 11/19/18 1356 11/20/18 0817 11/21/18 0738  WBC 19.4* 10.1 9.1 8.8  HGB 7.2* 7.6* 7.8* 9.0*  HCT 21.7* 23.2* 23.9* 26.7*  PLT 19* 13* 11* 10*    COAGS: Recent Labs    02/26/18 0724 08/05/18 0746 11/21/18 0738  INR 1.31 1.33 1.3*    BMP: Recent Labs    10/04/18 1407 11/01/18 1124 11/12/18 0957 11/20/18 0817  NA 140 140 142 143  K 4.3 4.6 4.1 4.5  CL 105 105 107 109  CO2 '26 26 27 27  ' GLUCOSE 95 86 100* 102*  BUN 14 22* 18 21*  CALCIUM 9.3 9.7 9.5 9.4  CREATININE 0.88 0.83 0.82 0.85  GFRNONAA >60 >60 >60 >60  GFRAA >60 >60 >60 >60    LIVER FUNCTION TESTS: Recent Labs    10/04/18 1407 11/01/18 1124 11/12/18 0957 11/20/18 0817  BILITOT 0.6 0.5 0.6 0.4  AST '18 18 15 15  ' ALT '18 18 17 16  ' ALKPHOS 91 84 83 84  PROT 6.0* 6.6 5.8* 6.0*  ALBUMIN 4.3 4.5 4.1 4.2    TUMOR MARKERS: No results for input(s): AFPTM, CEA, CA199, CHROMGRNA in the last 8760 hours.  Assessment and Plan: Patient with past medical history of CLL presents for bone marrow biopsy at the request of Dr. Benay Spice for planning purposes. Patient presents today in their usual state of health.  She has been NPO and is not currently on blood thinners.    Risks and benefits of biopsy was discussed with the patient and/or patient's family including, but not limited to bleeding, infection, damage to adjacent structures or low yield requiring additional tests.  All of the questions were answered and there is agreement to proceed.  Consent signed and in chart.    Thank you for this  interesting consult.  I greatly enjoyed meeting DONABELLE MOLDEN and look forward to participating in their care.  A copy of this report was sent to the requesting provider on this date.  Electronically Signed: Docia Barrier, PA 11/21/2018, 8:44 AM   I spent a total of  30 Minutes   in face to face in clinical consultation, greater than 50% of which was counseling/coordinating care for CLL.

## 2018-11-22 ENCOUNTER — Other Ambulatory Visit: Payer: BLUE CROSS/BLUE SHIELD

## 2018-11-22 ENCOUNTER — Other Ambulatory Visit: Payer: Self-pay | Admitting: Oncology

## 2018-11-22 ENCOUNTER — Inpatient Hospital Stay: Payer: BC Managed Care – PPO

## 2018-11-22 ENCOUNTER — Inpatient Hospital Stay (HOSPITAL_BASED_OUTPATIENT_CLINIC_OR_DEPARTMENT_OTHER): Payer: BC Managed Care – PPO | Admitting: Oncology

## 2018-11-22 ENCOUNTER — Other Ambulatory Visit: Payer: Self-pay

## 2018-11-22 ENCOUNTER — Inpatient Hospital Stay: Payer: BC Managed Care – PPO | Attending: Oncology

## 2018-11-22 DIAGNOSIS — Z9221 Personal history of antineoplastic chemotherapy: Secondary | ICD-10-CM | POA: Diagnosis not present

## 2018-11-22 DIAGNOSIS — Z79899 Other long term (current) drug therapy: Secondary | ICD-10-CM | POA: Insufficient documentation

## 2018-11-22 DIAGNOSIS — E039 Hypothyroidism, unspecified: Secondary | ICD-10-CM | POA: Insufficient documentation

## 2018-11-22 DIAGNOSIS — C9110 Chronic lymphocytic leukemia of B-cell type not having achieved remission: Secondary | ICD-10-CM

## 2018-11-22 DIAGNOSIS — D61818 Other pancytopenia: Secondary | ICD-10-CM

## 2018-11-22 DIAGNOSIS — D801 Nonfamilial hypogammaglobulinemia: Secondary | ICD-10-CM | POA: Insufficient documentation

## 2018-11-22 DIAGNOSIS — R0602 Shortness of breath: Secondary | ICD-10-CM | POA: Diagnosis not present

## 2018-11-22 DIAGNOSIS — D696 Thrombocytopenia, unspecified: Secondary | ICD-10-CM

## 2018-11-22 DIAGNOSIS — Z8781 Personal history of (healed) traumatic fracture: Secondary | ICD-10-CM | POA: Insufficient documentation

## 2018-11-22 LAB — CBC WITH DIFFERENTIAL (CANCER CENTER ONLY)
Abs Immature Granulocytes: 0.02 10*3/uL (ref 0.00–0.07)
Basophils Absolute: 0 10*3/uL (ref 0.0–0.1)
Basophils Relative: 0 %
Eosinophils Absolute: 0 10*3/uL (ref 0.0–0.5)
Eosinophils Relative: 0 %
HCT: 25.7 % — ABNORMAL LOW (ref 36.0–46.0)
Hemoglobin: 8.7 g/dL — ABNORMAL LOW (ref 12.0–15.0)
Immature Granulocytes: 0 %
Lymphocytes Relative: 83 %
Lymphs Abs: 5.4 10*3/uL — ABNORMAL HIGH (ref 0.7–4.0)
MCH: 29.4 pg (ref 26.0–34.0)
MCHC: 33.9 g/dL (ref 30.0–36.0)
MCV: 86.8 fL (ref 80.0–100.0)
Monocytes Absolute: 0.6 10*3/uL (ref 0.1–1.0)
Monocytes Relative: 9 %
Neutro Abs: 0.5 10*3/uL — ABNORMAL LOW (ref 1.7–7.7)
Neutrophils Relative %: 8 %
Platelet Count: 33 10*3/uL — ABNORMAL LOW (ref 150–400)
RBC: 2.96 MIL/uL — ABNORMAL LOW (ref 3.87–5.11)
RDW: 13.2 % (ref 11.5–15.5)
WBC Count: 6.6 10*3/uL (ref 4.0–10.5)
nRBC: 0 % (ref 0.0–0.2)

## 2018-11-22 LAB — SAMPLE TO BLOOD BANK

## 2018-11-22 MED ORDER — ACETAMINOPHEN 325 MG PO TABS: 650.0000 mg | ORAL_TABLET | Freq: Once | ORAL | Status: DC

## 2018-11-22 MED ORDER — DIPHENHYDRAMINE HCL 25 MG PO CAPS: 25.0000 mg | ORAL_CAPSULE | Freq: Once | ORAL | Status: DC

## 2018-11-22 MED ORDER — SODIUM CHLORIDE 0.9% IV SOLUTION
250.0000 mL | Freq: Once | INTRAVENOUS | Status: DC
  Filled 2018-11-22: qty 250

## 2018-11-22 NOTE — Progress Notes (Signed)
[place] OFFICE PROGRESS NOTE   Diagnosis: CLL, pancytopenia  INTERVAL HISTORY:   Meagan Walsh returns as scheduled.  No fever or bleeding.  She underwent a bone marrow biopsy on 11/21/2018.  She reports tolerating the procedure well.  She reports difficulty with IV access.  No other complaint.  Objective:  Vital signs in last 24 hours:  There were no vitals taken for this visit.     Skin: Right iliac bone marrow site without bleeding    Lab Results:  Lab Results  Component Value Date   WBC 6.6 11/22/2018   HGB 8.7 (L) 11/22/2018   HCT 25.7 (L) 11/22/2018   MCV 86.8 11/22/2018   PLT 33 (L) 11/22/2018   NEUTROABS 0.5 (L) 11/22/2018    CMP  Lab Results  Component Value Date   NA 143 11/20/2018   K 4.5 11/20/2018   CL 109 11/20/2018   CO2 27 11/20/2018   GLUCOSE 102 (H) 11/20/2018   BUN 21 (H) 11/20/2018   CREATININE 0.85 11/20/2018   CALCIUM 9.4 11/20/2018   PROT 6.0 (L) 11/20/2018   ALBUMIN 4.2 11/20/2018   AST 15 11/20/2018   ALT 16 11/20/2018   ALKPHOS 84 11/20/2018   BILITOT 0.4 11/20/2018   GFRNONAA >60 11/20/2018   GFRAA >60 11/20/2018     Imaging:  Ct Biopsy  Result Date: 11/21/2018 INDICATION: 58 year old female with chronic lymphocytic leukemia and severe anemia. Restaging bone marrow biopsy. EXAM: CT GUIDED BONE MARROW ASPIRATION AND CORE BIOPSY Interventional Radiologist:  Criselda Peaches, MD MEDICATIONS: None. ANESTHESIA/SEDATION: Moderate (conscious) sedation was employed during this procedure. A total of 4 milligrams versed and 100 micrograms fentanyl were administered intravenously. The patient's level of consciousness and vital signs were monitored continuously by radiology nursing throughout the procedure under my direct supervision. Total monitored sedation time: 12 minutes FLUOROSCOPY TIME:  None COMPLICATIONS: None immediate. Estimated blood loss: <25 mL PROCEDURE: Informed written consent was obtained from the patient  after a thorough discussion of the procedural risks, benefits and alternatives. All questions were addressed. Maximal Sterile Barrier Technique was utilized including caps, mask, sterile gowns, sterile gloves, sterile drape, hand hygiene and skin antiseptic. A timeout was performed prior to the initiation of the procedure. The patient was positioned prone and non-contrast localization CT was performed of the pelvis to demonstrate the iliac marrow spaces. Maximal barrier sterile technique utilized including caps, mask, sterile gowns, sterile gloves, large sterile drape, hand hygiene, and betadine prep. Under sterile conditions and local anesthesia, an 11 gauge coaxial bone biopsy needle was advanced into the right iliac marrow space. Needle position was confirmed with CT imaging. Initially, bone marrow aspiration was performed. Next, the 11 gauge outer cannula was utilized to obtain a right iliac bone marrow core biopsy. Needle was removed. Hemostasis was obtained with compression. The patient tolerated the procedure well. Samples were prepared with the cytotechnologist. IMPRESSION: Technically successful right-sided iliac bone marrow aspiration and core biopsy. Electronically Signed   By: Jacqulynn Cadet M.D.   On: 11/21/2018 10:16   Ct Bone Marrow Biopsy & Aspiration  Result Date: 11/21/2018 INDICATION: 58 year old female with chronic lymphocytic leukemia and severe anemia. Restaging bone marrow biopsy. EXAM: CT GUIDED BONE MARROW ASPIRATION AND CORE BIOPSY Interventional Radiologist:  Criselda Peaches, MD MEDICATIONS: None. ANESTHESIA/SEDATION: Moderate (conscious) sedation was employed during this procedure. A total of 4 milligrams versed and 100 micrograms fentanyl were administered intravenously. The patient's level of consciousness and vital signs were monitored continuously by radiology  nursing throughout the procedure under my direct supervision. Total monitored sedation time: 12 minutes FLUOROSCOPY  TIME:  None COMPLICATIONS: None immediate. Estimated blood loss: <25 mL PROCEDURE: Informed written consent was obtained from the patient after a thorough discussion of the procedural risks, benefits and alternatives. All questions were addressed. Maximal Sterile Barrier Technique was utilized including caps, mask, sterile gowns, sterile gloves, sterile drape, hand hygiene and skin antiseptic. A timeout was performed prior to the initiation of the procedure. The patient was positioned prone and non-contrast localization CT was performed of the pelvis to demonstrate the iliac marrow spaces. Maximal barrier sterile technique utilized including caps, mask, sterile gowns, sterile gloves, large sterile drape, hand hygiene, and betadine prep. Under sterile conditions and local anesthesia, an 11 gauge coaxial bone biopsy needle was advanced into the right iliac marrow space. Needle position was confirmed with CT imaging. Initially, bone marrow aspiration was performed. Next, the 11 gauge outer cannula was utilized to obtain a right iliac bone marrow core biopsy. Needle was removed. Hemostasis was obtained with compression. The patient tolerated the procedure well. Samples were prepared with the cytotechnologist. IMPRESSION: Technically successful right-sided iliac bone marrow aspiration and core biopsy. Electronically Signed   By: Jacqulynn Cadet M.D.   On: 11/21/2018 10:16    Medications: I have reviewed the patient's current medications.   Assessment/Plan: 1. 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 Mild spinal megaly.  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 (17p13) deletion, no evidence of 11q22deletion  Bone marrow biopsy 02/26/2018-hypercellular marrow with extensive involvement by CLL, lymphocytes represent85% of all cells  Ibrutinib initiated 04/03/2018  Ibrutinib placed on hold 04/11/2018 due to onset of arthralgias  Ibrutinib resumed 04/16/2018, discontinued 04/25/2018 secondary to severe arthralgias/arthritis  Ibrutinib resumed at a dose of 140 mg daily 05/03/2018  Ibrutinib dose adjusted to 140 mg alternating with 27m 06/25/2018  Ibrutinib discontinued 07/03/2018 secondary to severe arthralgias  Acalabrutinib 08/16/2018, discontinued 11/15/2018 secondary to persistent severe transfusion dependent anemia and neutropenia/thrombocytopenia, last dose 11/14/2018  Bone marrow biopsy 11/21/2018- decreased cellularity, involvement by CLL, decreased decreased erythroid and granulocytic precursors, decreased megakaryocytes 2.Hypothyroidism 3.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 Augmentin prescribed. 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 NHolnessappears unchanged.  She has persistent pancytopenia.  I discussed the bone marrow biopsy with  Ms. NAnguillaand with her husband by telephone.  We discussed the treatment course to date and current management options.  I reviewed the bone marrow slides and pathology.  She now has a hypocellular marrow with decreased hematopoietic precursors and persistent involvement with CLL.  She understands her treatment course and hematologic findings are unusual for patients with CLL. I  reviewed the current bone marrow findings and treatment history at length with Dr. Prince Solian.  She plans to see Ms. Reichel via a video visit next week. It is unclear whether the bone marrow findings and cytopenias are related to an immune phenomena in the setting of CLL or another etiology.  There may be a component of toxicity from the BTK inhibitors.  The plan is to hold specific therapy for the CLL at present.  We will continue supportive care with platelet and red cell transfusions.  We will consider starting a trial of Nplate, prednisone, and rituximab after she is evaluated by Dr. Prince Solian.  She knows to call for a fever or bleeding.  Ms. Courtois will be referred for Port-A-Cath placement.  She will be scheduled for an office visit next week.  She received a platelet transfusion today and the posttransfusion platelet count is higher.  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  We will consider starting a trial of Nplate after she consults with Dr. Prince Solian.  Betsy Coder, MD  11/22/2018  4:18 PM

## 2018-11-22 NOTE — Patient Instructions (Signed)

## 2018-11-23 LAB — BPAM PLATELET PHERESIS
Blood Product Expiration Date: 202005022359
ISSUE DATE / TIME: 202005010910
Unit Type and Rh: 5100

## 2018-11-23 LAB — PREPARE PLATELET PHERESIS: Unit division: 0

## 2018-11-25 ENCOUNTER — Inpatient Hospital Stay: Payer: BC Managed Care – PPO

## 2018-11-25 ENCOUNTER — Other Ambulatory Visit: Payer: Self-pay

## 2018-11-25 ENCOUNTER — Other Ambulatory Visit: Payer: Self-pay | Admitting: Pharmacist

## 2018-11-25 ENCOUNTER — Telehealth: Payer: Self-pay | Admitting: *Deleted

## 2018-11-25 ENCOUNTER — Telehealth: Payer: Self-pay | Admitting: Oncology

## 2018-11-25 ENCOUNTER — Inpatient Hospital Stay: Payer: BC Managed Care – PPO | Admitting: Oncology

## 2018-11-25 ENCOUNTER — Other Ambulatory Visit: Payer: Self-pay | Admitting: *Deleted

## 2018-11-25 DIAGNOSIS — Z8781 Personal history of (healed) traumatic fracture: Secondary | ICD-10-CM | POA: Diagnosis not present

## 2018-11-25 DIAGNOSIS — D696 Thrombocytopenia, unspecified: Secondary | ICD-10-CM

## 2018-11-25 DIAGNOSIS — C9110 Chronic lymphocytic leukemia of B-cell type not having achieved remission: Secondary | ICD-10-CM

## 2018-11-25 DIAGNOSIS — R0602 Shortness of breath: Secondary | ICD-10-CM | POA: Diagnosis not present

## 2018-11-25 DIAGNOSIS — D801 Nonfamilial hypogammaglobulinemia: Secondary | ICD-10-CM | POA: Diagnosis not present

## 2018-11-25 DIAGNOSIS — Z9221 Personal history of antineoplastic chemotherapy: Secondary | ICD-10-CM | POA: Diagnosis not present

## 2018-11-25 DIAGNOSIS — D61818 Other pancytopenia: Secondary | ICD-10-CM | POA: Diagnosis not present

## 2018-11-25 DIAGNOSIS — D649 Anemia, unspecified: Secondary | ICD-10-CM

## 2018-11-25 DIAGNOSIS — E039 Hypothyroidism, unspecified: Secondary | ICD-10-CM | POA: Diagnosis not present

## 2018-11-25 DIAGNOSIS — Z79899 Other long term (current) drug therapy: Secondary | ICD-10-CM | POA: Diagnosis not present

## 2018-11-25 LAB — CBC WITH DIFFERENTIAL (CANCER CENTER ONLY)
Abs Immature Granulocytes: 0 10*3/uL (ref 0.00–0.07)
Basophils Absolute: 0 10*3/uL (ref 0.0–0.1)
Basophils Relative: 0 %
Eosinophils Absolute: 0 10*3/uL (ref 0.0–0.5)
Eosinophils Relative: 0 %
HCT: 26.7 % — ABNORMAL LOW (ref 36.0–46.0)
Hemoglobin: 8.7 g/dL — ABNORMAL LOW (ref 12.0–15.0)
Lymphocytes Relative: 86 %
Lymphs Abs: 5 10*3/uL — ABNORMAL HIGH (ref 0.7–4.0)
MCH: 29.2 pg (ref 26.0–34.0)
MCHC: 32.6 g/dL (ref 30.0–36.0)
MCV: 89.6 fL (ref 80.0–100.0)
Monocytes Absolute: 0 10*3/uL — ABNORMAL LOW (ref 0.1–1.0)
Monocytes Relative: 0 %
Neutro Abs: 0.8 10*3/uL — ABNORMAL LOW (ref 1.7–17.7)
Neutrophils Relative %: 14 %
Platelet Count: 23 10*3/uL — ABNORMAL LOW (ref 150–400)
RBC: 2.98 MIL/uL — ABNORMAL LOW (ref 3.87–5.11)
RDW: 12.7 % (ref 11.5–15.5)
WBC Count: 5.8 10*3/uL (ref 4.0–10.5)
nRBC: 0 % (ref 0.0–0.2)

## 2018-11-25 LAB — CBC WITH DIFFERENTIAL/PLATELET
Abs Immature Granulocytes: 0.01 10*3/uL (ref 0.00–0.07)
Basophils Absolute: 0 10*3/uL (ref 0.0–0.1)
Basophils Relative: 0 %
Eosinophils Absolute: 0 10*3/uL (ref 0.0–0.5)
Eosinophils Relative: 0 %
HCT: 26.7 % — ABNORMAL LOW (ref 36.0–46.0)
Hemoglobin: 9 g/dL — ABNORMAL LOW (ref 12.0–15.0)
Immature Granulocytes: 0 %
Lymphocytes Relative: 90 %
Lymphs Abs: 7.8 10*3/uL — ABNORMAL HIGH (ref 0.7–4.0)
MCH: 29.7 pg (ref 26.0–34.0)
MCHC: 33.7 g/dL (ref 30.0–36.0)
MCV: 88.1 fL (ref 80.0–100.0)
Monocytes Absolute: 0.3 10*3/uL (ref 0.1–1.0)
Monocytes Relative: 4 %
Neutro Abs: 0.6 10*3/uL — ABNORMAL LOW (ref 1.7–7.7)
Neutrophils Relative %: 6 %
Platelets: 10 10*3/uL — CL (ref 150–400)
RBC: 3.03 MIL/uL — ABNORMAL LOW (ref 3.87–5.11)
RDW: 13.5 % (ref 11.5–15.5)
WBC: 8.8 10*3/uL (ref 4.0–10.5)
nRBC: 0 % (ref 0.0–0.2)

## 2018-11-25 LAB — SAMPLE TO BLOOD BANK

## 2018-11-25 NOTE — Telephone Encounter (Signed)
Medical records faxed to [facility] - Dr. Zara Council; [place] [phone]

## 2018-11-25 NOTE — Telephone Encounter (Signed)
Per 5/1 los, return for scan

## 2018-11-26 ENCOUNTER — Encounter
Admit: 2018-11-26 | Discharge: 2018-11-27 | Payer: PRIVATE HEALTH INSURANCE | Attending: Hematology & Oncology | Primary: Hematology & Oncology

## 2018-11-26 DIAGNOSIS — D801 Nonfamilial hypogammaglobulinemia: Secondary | ICD-10-CM | POA: Diagnosis not present

## 2018-11-26 DIAGNOSIS — C9110 Chronic lymphocytic leukemia of B-cell type not having achieved remission: Secondary | ICD-10-CM | POA: Diagnosis not present

## 2018-11-28 ENCOUNTER — Inpatient Hospital Stay: Payer: BC Managed Care – PPO

## 2018-11-28 ENCOUNTER — Other Ambulatory Visit: Payer: Self-pay | Admitting: *Deleted

## 2018-11-28 ENCOUNTER — Other Ambulatory Visit: Payer: Self-pay

## 2018-11-28 ENCOUNTER — Other Ambulatory Visit: Payer: Self-pay | Admitting: Oncology

## 2018-11-28 DIAGNOSIS — E039 Hypothyroidism, unspecified: Secondary | ICD-10-CM | POA: Diagnosis not present

## 2018-11-28 DIAGNOSIS — C9110 Chronic lymphocytic leukemia of B-cell type not having achieved remission: Secondary | ICD-10-CM | POA: Diagnosis not present

## 2018-11-28 DIAGNOSIS — Z9221 Personal history of antineoplastic chemotherapy: Secondary | ICD-10-CM | POA: Diagnosis not present

## 2018-11-28 DIAGNOSIS — D696 Thrombocytopenia, unspecified: Secondary | ICD-10-CM

## 2018-11-28 DIAGNOSIS — D649 Anemia, unspecified: Secondary | ICD-10-CM

## 2018-11-28 DIAGNOSIS — D801 Nonfamilial hypogammaglobulinemia: Secondary | ICD-10-CM | POA: Diagnosis not present

## 2018-11-28 DIAGNOSIS — Z8781 Personal history of (healed) traumatic fracture: Secondary | ICD-10-CM | POA: Diagnosis not present

## 2018-11-28 DIAGNOSIS — D61818 Other pancytopenia: Secondary | ICD-10-CM | POA: Diagnosis not present

## 2018-11-28 DIAGNOSIS — R0602 Shortness of breath: Secondary | ICD-10-CM | POA: Diagnosis not present

## 2018-11-28 DIAGNOSIS — Z79899 Other long term (current) drug therapy: Secondary | ICD-10-CM | POA: Diagnosis not present

## 2018-11-28 LAB — CBC WITH DIFFERENTIAL (CANCER CENTER ONLY)
Abs Immature Granulocytes: 0 10*3/uL (ref 0.00–0.07)
Basophils Absolute: 0 10*3/uL (ref 0.0–0.1)
Basophils Relative: 0 %
Eosinophils Absolute: 0 10*3/uL (ref 0.0–0.5)
Eosinophils Relative: 0 %
HCT: 25 % — ABNORMAL LOW (ref 36.0–46.0)
Hemoglobin: 8.3 g/dL — ABNORMAL LOW (ref 12.0–15.0)
Immature Granulocytes: 0 %
Lymphocytes Relative: 77 %
Lymphs Abs: 3.5 10*3/uL (ref 0.7–4.0)
MCH: 29 pg (ref 26.0–34.0)
MCHC: 33.2 g/dL (ref 30.0–36.0)
MCV: 87.4 fL (ref 80.0–100.0)
Monocytes Absolute: 0.5 10*3/uL (ref 0.1–1.0)
Monocytes Relative: 12 %
Neutro Abs: 0.5 10*3/uL — ABNORMAL LOW (ref 1.7–7.7)
Neutrophils Relative %: 11 %
Platelet Count: 10 10*3/uL — ABNORMAL LOW (ref 150–400)
RBC: 2.86 MIL/uL — ABNORMAL LOW (ref 3.87–5.11)
RDW: 12.4 % (ref 11.5–15.5)
WBC Count: 4.5 10*3/uL (ref 4.0–10.5)
nRBC: 0 % (ref 0.0–0.2)

## 2018-11-28 LAB — BASIC METABOLIC PANEL - CANCER CENTER ONLY
Anion gap: 7 (ref 5–15)
BUN: 17 mg/dL (ref 6–20)
CO2: 28 mmol/L (ref 22–32)
Calcium: 9.4 mg/dL (ref 8.9–10.3)
Chloride: 108 mmol/L (ref 98–111)
Creatinine: 0.85 mg/dL (ref 0.44–1.00)
GFR, Est AFR Am: >60 mL/min (ref >60)
GFR, Estimated: >60 mL/min (ref >60)
Glucose, Bld: 132 mg/dL — ABNORMAL HIGH (ref 70–99)
Potassium: 4.1 mmol/L (ref 3.5–5.1)
Sodium: 143 mmol/L (ref 135–145)

## 2018-11-28 LAB — SAMPLE TO BLOOD BANK

## 2018-11-28 NOTE — Progress Notes (Addendum)
Per Dr. Benay Spice: Needs weekly Nplate beginning this week and weekly Rituxan beginning next week. He will discuss with her tomorrow when she is here for platelet transfusion. He suggests she have port placed when possible for future multiple transfusions and Rituxan. Blood bank notified to prepare platelets now for transfusion tomorrow. Patient notified and wishes to discuss the port with MD tomorrow. Patient called back at 1:00 pm today and said she definitely wants the port and requests we proceed with scheduling this. Contacted IR.

## 2018-11-29 ENCOUNTER — Other Ambulatory Visit: Payer: BLUE CROSS/BLUE SHIELD

## 2018-11-29 ENCOUNTER — Other Ambulatory Visit: Payer: Self-pay | Admitting: *Deleted

## 2018-11-29 ENCOUNTER — Inpatient Hospital Stay: Payer: BC Managed Care – PPO

## 2018-11-29 ENCOUNTER — Telehealth: Payer: Self-pay | Admitting: Oncology

## 2018-11-29 ENCOUNTER — Other Ambulatory Visit: Payer: Self-pay

## 2018-11-29 ENCOUNTER — Inpatient Hospital Stay (HOSPITAL_BASED_OUTPATIENT_CLINIC_OR_DEPARTMENT_OTHER): Payer: BC Managed Care – PPO | Admitting: Oncology

## 2018-11-29 VITALS — BP 95/54 | HR 75 | Temp 98.4°F | Resp 16 | Wt 130.0 lb

## 2018-11-29 DIAGNOSIS — C9110 Chronic lymphocytic leukemia of B-cell type not having achieved remission: Secondary | ICD-10-CM

## 2018-11-29 DIAGNOSIS — D801 Nonfamilial hypogammaglobulinemia: Secondary | ICD-10-CM

## 2018-11-29 DIAGNOSIS — E039 Hypothyroidism, unspecified: Secondary | ICD-10-CM

## 2018-11-29 DIAGNOSIS — R0602 Shortness of breath: Secondary | ICD-10-CM | POA: Diagnosis not present

## 2018-11-29 DIAGNOSIS — Z79899 Other long term (current) drug therapy: Secondary | ICD-10-CM | POA: Diagnosis not present

## 2018-11-29 DIAGNOSIS — D696 Thrombocytopenia, unspecified: Secondary | ICD-10-CM

## 2018-11-29 DIAGNOSIS — Z8781 Personal history of (healed) traumatic fracture: Secondary | ICD-10-CM | POA: Diagnosis not present

## 2018-11-29 DIAGNOSIS — D61818 Other pancytopenia: Secondary | ICD-10-CM | POA: Diagnosis not present

## 2018-11-29 DIAGNOSIS — Z9221 Personal history of antineoplastic chemotherapy: Secondary | ICD-10-CM | POA: Diagnosis not present

## 2018-11-29 DIAGNOSIS — D649 Anemia, unspecified: Secondary | ICD-10-CM

## 2018-11-29 LAB — CBC WITH DIFFERENTIAL (CANCER CENTER ONLY)
Abs Immature Granulocytes: 0 10*3/uL (ref 0.00–0.07)
Basophils Absolute: 0 10*3/uL (ref 0.0–0.1)
Basophils Relative: 0 %
Eosinophils Absolute: 0 10*3/uL (ref 0.0–0.5)
Eosinophils Relative: 0 %
HCT: 23 % — ABNORMAL LOW (ref 36.0–46.0)
Hemoglobin: 7.5 g/dL — ABNORMAL LOW (ref 12.0–15.0)
Lymphocytes Relative: 85 %
Lymphs Abs: 4.7 10*3/uL — ABNORMAL HIGH (ref 0.7–4.0)
MCH: 28.5 pg (ref 26.0–34.0)
MCHC: 32.6 g/dL (ref 30.0–36.0)
MCV: 87.5 fL (ref 80.0–100.0)
Monocytes Absolute: 0.2 10*3/uL (ref 0.1–1.0)
Monocytes Relative: 3 %
Neutro Abs: 0.7 10*3/uL — ABNORMAL LOW (ref 1.7–17.7)
Neutrophils Relative %: 12 %
Platelet Count: 16 10*3/uL — ABNORMAL LOW (ref 150–400)
RBC: 2.63 MIL/uL — ABNORMAL LOW (ref 3.87–5.11)
RDW: 12.3 % (ref 11.5–15.5)
WBC Count: 5.5 10*3/uL (ref 4.0–10.5)
nRBC: 0 % (ref 0.0–0.2)

## 2018-11-29 LAB — PREPARE RBC (CROSSMATCH)

## 2018-11-29 MED ORDER — SODIUM CHLORIDE 0.9% IV SOLUTION
250.0000 mL | Freq: Once | INTRAVENOUS | Status: DC
  Filled 2018-11-29: qty 250

## 2018-11-29 MED ORDER — ACETAMINOPHEN 325 MG PO TABS
ORAL_TABLET | ORAL | Status: AC
  Filled 2018-11-29: qty 2

## 2018-11-29 MED ORDER — ROMIPLOSTIM 250 MCG ~~LOC~~ SOLR: 1.0000 ug/kg | Freq: Once | SUBCUTANEOUS | Status: DC

## 2018-11-29 MED ORDER — DIPHENHYDRAMINE HCL 25 MG PO CAPS
ORAL_CAPSULE | ORAL | Status: AC
  Filled 2018-11-29: qty 1

## 2018-11-29 NOTE — Progress Notes (Signed)
The 0935 entry from today's platelets was a typo entry. I entered 16 with Wendall Mola RN as my verifier and it came up at 0935. Platelets were started at 0835 and titrated at 0850 as documented and completed at 0910 as documented.

## 2018-11-29 NOTE — Telephone Encounter (Signed)
Scheduled appt per 5/8 los.  Added treatment to the book for approval (5/14).  Spoke with patient and patient aware of appt date and time.

## 2018-11-29 NOTE — Patient Instructions (Signed)
Romiplostim injection What is this medicine? ROMIPLOSTIM (roe mi PLOE stim) helps your body make more platelets. This medicine is used to treat low platelets caused by chronic idiopathic thrombocytopenic purpura (ITP). This medicine may be used for other purposes; ask your health care provider or pharmacist if you have questions. COMMON BRAND NAME(S): Nplate What should I tell my health care provider before I take this medicine? They need to know if you have any of these conditions: -bleeding disorders -bone marrow problem, like blood cancer or myelodysplastic syndrome -history of blood clots -liver disease -surgery to remove your spleen -an unusual or allergic reaction to romiplostim, mannitol, other medicines, foods, dyes, or preservatives -pregnant or trying to get pregnant -breast-feeding How should I use this medicine? This medicine is for injection under the skin. It is given by a health care professional in a hospital or clinic setting. A special MedGuide will be given to you before your injection. Read this information carefully each time. Talk to your pediatrician regarding the use of this medicine in children. While this drug may be prescribed for children as young as 1 year for selected conditions, precautions do apply. Overdosage: If you think you have taken too much of this medicine contact a poison control center or emergency room at once. NOTE: This medicine is only for you. Do not share this medicine with others. What if I miss a dose? It is important not to miss your dose. Call your doctor or health care professional if you are unable to keep an appointment. What may interact with this medicine? Interactions are not expected. This list may not describe all possible interactions. Give your health care provider a list of all the medicines, herbs, non-prescription drugs, or dietary supplements you use. Also tell them if you smoke, drink alcohol, or use illegal drugs. Some items  may interact with your medicine. What should I watch for while using this medicine? Your condition will be monitored carefully while you are receiving this medicine. Visit your prescriber or health care professional for regular checks on your progress and for the needed blood tests. It is important to keep all appointments. What side effects may I notice from receiving this medicine? Side effects that you should report to your doctor or health care professional as soon as possible: -allergic reactions like skin rash, itching or hives, swelling of the face, lips, or tongue -signs and symptoms of bleeding such as bloody or black, tarry stools; red or dark brown urine; spitting up blood or brown material that looks like coffee grounds; red spots on the skin; unusual bruising or bleeding from the eyes, gums, or nose -signs and symptoms of a blood clot such as chest pain; shortness of breath; pain, swelling, or warmth in the leg -signs and symptoms of a stroke like changes in vision; confusion; trouble speaking or understanding; severe headaches; sudden numbness or weakness of the face, arm or leg; trouble walking; dizziness; loss of balance or coordination Side effects that usually do not require medical attention (report to your doctor or health care professional if they continue or are bothersome): -headache -pain in arms and legs -pain in mouth -stomach pain This list may not describe all possible side effects. Call your doctor for medical advice about side effects. You may report side effects to FDA at 1-800-FDA-1088. Where should I keep my medicine? This drug is given in a hospital or clinic and will not be stored at home. NOTE: This sheet is a summary. It may not   cover all possible information. If you have questions about this medicine, talk to your doctor, pharmacist, or health care provider.  2019 Elsevier/Gold Standard (2017-07-09 11:10:55)  

## 2018-11-29 NOTE — Progress Notes (Signed)
Orders placed to check platelets 30 minutes after infusion completed today. Email sent to [facility] in managed care to determine if Nplate has been approved for administration today.

## 2018-11-29 NOTE — Progress Notes (Signed)
[place] OFFICE PROGRESS NOTE   Diagnosis: CLL, pancytopenia  INTERVAL HISTORY:   Ms. Heffernan returns for a scheduled visit.  She has stable exertional dyspnea.  She does not have palpitations at present.  No fever.  She bruises easily, she has blood in the spit when she brushes her teeth.  No other bleeding. She had a telephone visit with Dr. Zara Council earlier this week.  Dr. Prince Solian recommends treatment with a thrombopoietin agonist and rituximab.  Objective:  Vital signs in last 24 hours:  There were no vitals taken for this visit.    HEENT: Small ecchymoses at the buccal mucosa, no thrush Resp: Lungs clear bilaterally Cardio: Regular rate and rhythm GI: No hepatosplenomegaly Vascular: No leg edema  Skin: Resolving ecchymosis of the left upper arm, few petechiae at the lower leg bilaterally    Lab Results:  Lab Results  Component Value Date   WBC 5.5 11/29/2018   HGB 7.5 (L) 11/29/2018   HCT 23.0 (L) 11/29/2018   MCV 87.5 11/29/2018   PLT 16 (L) 11/29/2018   NEUTROABS 0.7 (L) 11/29/2018    CMP  Lab Results  Component Value Date   NA 143 11/28/2018   K 4.1 11/28/2018   CL 108 11/28/2018   CO2 28 11/28/2018   GLUCOSE 132 (H) 11/28/2018   BUN 17 11/28/2018   CREATININE 0.85 11/28/2018   CALCIUM 9.4 11/28/2018   PROT 6.0 (L) 11/20/2018   ALBUMIN 4.2 11/20/2018   AST 15 11/20/2018   ALT 16 11/20/2018   ALKPHOS 84 11/20/2018   BILITOT 0.4 11/20/2018   GFRNONAA >60 11/28/2018   GFRAA >60 11/28/2018     Medications: I have reviewed the patient's current medications.   Assessment/Plan: 1. 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 Mild spinal megaly.   Ultrasound-guided biopsy of the left inguinal lymph node 09/18/2017, slightly "purulent "fluid aspirated, core biopsy is consistent with an atypical lymphoid proliferation-extensive necrosis with surrounding epithelioid histiocytes, limited intact lymphoid tissue involved with CLL  Incisional biopsy of a necrotic/purulent left inguinal lymph node on 10/01/2017-extensive necrosis with granulomatous inflammation, small amount of viable lymphoid tissue involved with CLL, AFB and fungal stains negative  Peripheral blood FISH analysis 02/05/2018- deletion 13q14, no evidence of p53 (17p13) deletion, no evidence of 11q22deletion  Bone marrow biopsy 02/26/2018-hypercellular marrow with extensive involvement by CLL, lymphocytes represent85% of all cells  Ibrutinib initiated 04/03/2018  Ibrutinib placed on hold 04/11/2018 due to onset of arthralgias  Ibrutinib resumed 04/16/2018, discontinued 04/25/2018 secondary to severe arthralgias/arthritis  Ibrutinib resumed at a dose of 140 mg daily 05/03/2018  Ibrutinib dose adjusted to 140 mg alternating with 256m 06/25/2018  Ibrutinib discontinued 07/03/2018 secondary to severe arthralgias  Acalabrutinib 08/16/2018, discontinued 11/15/2018 secondary to persistent severe transfusion dependent anemia and neutropenia/thrombocytopenia, last dose 11/14/2018  Bone marrow biopsy 11/21/2018- decreased cellularity, involvement by CLL, decreased erythroid and granulocytic precursors, decreased megakaryocytes 2.Hypothyroidism 3.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 Augmentin prescribed. 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  Thrombocytopenia 9.   Anemia    Disposition: Ms. NSakumaappears unchanged.  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  She understands the unusual nature of her presentation.  I have communicated with Dr. Prince Solian on multiple occasions over the past several days.  The case was presented at the [facility] hematology conference.  Dr. Prince Solian recommends rituximab and nplate.  Ms. Baggett received a platelet transfusion today and did not achieve a significant platelet increment.  She will receive platelets again tomorrow with the plan to begin Nplate on 7/32/2025.  We are waiting on insurance approval for Nplate.  She will receive a red cell transfusion tomorrow.  The plan is to begin G-CSF support if the neutrophil count falls to less than 500.  Ms. Pottenger knows to call for bleeding or any fever.  The goal is to support the cytopenias and hope for hematologic recovery over the next few months.  She will also receive rituximab in an attempt to control the CLL and treat any autoimmune of the cytopenias.  I reviewed potential toxicities associated with rituximab including the chance of an allergic reaction, pneumonitis, CNS toxicity, infection, reactivation of hepatitis, and hematologic toxicity.  She agrees to proceed.  We have noted a history of her being positive for hepatitis B antibody.  We will check hepatitis B surface antigen and core antibody when she returns for labs on 12/02/2018.  She will be scheduled for a Port-A-Cath placement.  The plan is to begin rituximab on 12/05/2018.  Her husband was present for the discussion by telephone.  40 minutes were spent with the patient today.  The majority of the time was used for counseling and coordination of care.  Betsy Coder, MD  11/29/2018  4:27 PM

## 2018-11-29 NOTE — Progress Notes (Signed)
Spoke w/ Dr. Benay Spice 11/29/18 - NPlate initiation will occur Monday, 5/11. Patient's PA was submitted today and is currently pending per [facility].   Demetrius Charity, PharmD, [place] [facility] Phone: [phone] 11/29/2018

## 2018-11-29 NOTE — Progress Notes (Addendum)
Patient having shortness of breath, tachycardia, palpitations and wants transfusion if possible tomorrow. Orders placed and blood bank is checking with pathologist to confirm OK to transfuse with Hgb 7.5. Noted she was 8.3 on 5/8, thus almost a gram drop in 1 day. Blood bank will call back with decision.  Scheduling message sent high priority. Patient notified of counts and platelet increased to 16,000 after transfusion. Blood bank will allow 1 unit blood and Dr. Benay Spice also wants platelets tomorrow as well. Was informed by blood bank this should be no problem having platelets for her tomorrow. Dr. Benay Spice spoke with Dr. Saralyn Pilar and he approved 1 unit transfusion on 11/30/18

## 2018-11-30 ENCOUNTER — Other Ambulatory Visit: Payer: Self-pay

## 2018-11-30 ENCOUNTER — Inpatient Hospital Stay: Payer: BC Managed Care – PPO

## 2018-11-30 DIAGNOSIS — D696 Thrombocytopenia, unspecified: Secondary | ICD-10-CM

## 2018-11-30 DIAGNOSIS — C9110 Chronic lymphocytic leukemia of B-cell type not having achieved remission: Secondary | ICD-10-CM

## 2018-11-30 DIAGNOSIS — D801 Nonfamilial hypogammaglobulinemia: Secondary | ICD-10-CM | POA: Diagnosis not present

## 2018-11-30 DIAGNOSIS — Z8781 Personal history of (healed) traumatic fracture: Secondary | ICD-10-CM | POA: Diagnosis not present

## 2018-11-30 DIAGNOSIS — E039 Hypothyroidism, unspecified: Secondary | ICD-10-CM | POA: Diagnosis not present

## 2018-11-30 DIAGNOSIS — R0602 Shortness of breath: Secondary | ICD-10-CM | POA: Diagnosis not present

## 2018-11-30 DIAGNOSIS — Z79899 Other long term (current) drug therapy: Secondary | ICD-10-CM | POA: Diagnosis not present

## 2018-11-30 DIAGNOSIS — D61818 Other pancytopenia: Secondary | ICD-10-CM | POA: Diagnosis not present

## 2018-11-30 DIAGNOSIS — Z9221 Personal history of antineoplastic chemotherapy: Secondary | ICD-10-CM | POA: Diagnosis not present

## 2018-11-30 DIAGNOSIS — D649 Anemia, unspecified: Secondary | ICD-10-CM

## 2018-11-30 LAB — PREPARE PLATELET PHERESIS: Unit division: 0

## 2018-11-30 LAB — BPAM PLATELET PHERESIS
Blood Product Expiration Date: 202005082359
ISSUE DATE / TIME: 202005080824
Unit Type and Rh: 6200

## 2018-11-30 MED ORDER — SODIUM CHLORIDE 0.9% IV SOLUTION
250.0000 mL | Freq: Once | INTRAVENOUS | Status: AC
  Administered 2018-11-30: 09:00:00 250 mL via INTRAVENOUS
  Filled 2018-11-30: qty 250

## 2018-11-30 MED ORDER — ACETAMINOPHEN 325 MG PO TABS: 650.0000 mg | ORAL_TABLET | Freq: Once | ORAL | Status: DC

## 2018-11-30 MED ORDER — DIPHENHYDRAMINE HCL 25 MG PO CAPS: 25.0000 mg | ORAL_CAPSULE | Freq: Once | ORAL | Status: DC

## 2018-11-30 NOTE — Patient Instructions (Signed)
Platelet Transfusion  A platelet transfusion is a procedure in which you receive donated platelets through an IV. Platelets are tiny pieces of blood cells. When you get an injury, platelets clump together in the area to form a blood clot. This helps stop bleeding and is the beginning of the healing process. If you have too few platelets, your blood may have trouble clotting. This may cause you to bleed and bruise very easily.  You may need a platelet transfusion if you have a condition that causes a low number of platelets (thrombocytopenia). A platelet transfusion may be used to stop or prevent excessive bleeding.  Tell a health care provider about:   Any reactions you have had during previous transfusions.   Any allergies you have.   All medicines you are taking, including vitamins, herbs, eye drops, creams, and over-the-counter medicines.   Any blood disorders you have.   Any surgeries you have had.   Any medical conditions you have.   Whether you are pregnant or may be pregnant.  What are the risks?  Generally, this is a safe procedure. However, problems may occur, including:   Fever.   Infection.   Allergic reaction to the donor platelets.   Your body's disease-fighting system (immune system) attacking the donor platelets (hemolytic reaction). This is rare.   A rare reaction that causes lung damage (transfusion-related acute lung injury).  What happens before the procedure?  Medicines   Ask your health care provider about:  ? Changing or stopping your regular medicines. This is especially important if you are taking diabetes medicines or blood thinners.  ? Taking medicines such as aspirin and ibuprofen. These medicines can thin your blood. Do not take these medicines unless your health care provider tells you to take them.  ? Taking over-the-counter medicines, vitamins, herbs, and supplements.  General instructions   You will have a blood test to determine your blood type. Your blood type  determines what kind of platelets you will be given.   Follow instructions from your health care provider about eating or drinking restrictions.   If you have had an allergic reaction to a transfusion in the past, you may be given medicine to help prevent a reaction.   Your temperature, blood pressure, pulse, and breathing will be monitored.  What happens during the procedure?     An IV will be inserted into one of your veins.   For your safety, two health care providers will verify your identity along with the donor platelets about to be infused.   A bag of donor platelets will be connected to your IV. The platelets will flow into your bloodstream. This usually takes 30-60 minutes.   Your temperature, blood pressure, pulse, and breathing will be monitored during the transfusion. This helps detect early signs of any reaction.   You will also be monitored for other symptoms that may indicate a reaction, including chills, hives, or itching.   If you have signs of a reaction at any time, your transfusion will be stopped, and you may be given medicine to help manage the reaction.   When your transfusion is complete, your IV will be removed.   Pressure may be applied to the IV site for a few minutes to stop any bleeding.   The IV site will be covered with a bandage (dressing).  The procedure may vary among health care providers and hospitals.  What happens after the procedure?   Your blood pressure, temperature, pulse, and breathing   will be monitored until you leave the hospital or clinic.   You may have some bruising and soreness at your IV site.  Follow these instructions at home:  Medicines   Take over-the-counter and prescription medicines only as told by your health care provider.   Talk with your health care provider before you take any medicines that contain aspirin or NSAIDs. These medicines increase your risk for dangerous bleeding.  General instructions   Change or remove your dressing as told  by your health care provider.   Return to your normal activities as told by your health care provider. Ask your health care provider what activities are safe for you.   Do not take baths, swim, or use a hot tub until your health care provider approves. Ask your health care provider if you may take showers.   Check your IV site every day for signs of infection. Check for:  ? Redness, swelling, or pain.  ? Fluid or blood. If fluid or blood drains from your IV site, use your hands to press down firmly on a bandage covering the area for a minute or two. Doing this should stop the bleeding.  ? Warmth.  ? Pus or a bad smell.   Keep all follow-up visits as told by your health care provider. This is important.  Contact a health care provider if you have:   A headache that does not go away with medicine.   Hives, rash, or itchy skin.   Nausea or vomiting.   Unusual tiredness or weakness.   Signs of infection at your IV site.  Get help right away if:   You have a fever or chills.   You urinate less often than usual.   Your urine is darker colored than normal.   You have any of the following:  ? Trouble breathing.  ? Pain in your back, abdomen, or chest.  ? Cool, clammy skin.  ? A fast heartbeat.  Summary   Platelets are tiny pieces of blood cells that clump together to form a blood clot when you have an injury. If you have too few platelets, your blood may have trouble clotting.   A platelet transfusion is a procedure in which you receive donated platelets through an IV.   A platelet transfusion may be used to stop or prevent excessive bleeding.   After the procedure, check your IV site every day for signs of infection, including redness, swelling, pain, or warmth.  This information is not intended to replace advice given to you by your health care provider. Make sure you discuss any questions you have with your health care provider.  Document Released: 05/07/2007 Document Revised: 08/15/2017 Document  Reviewed: 08/15/2017  Elsevier Interactive Patient Education  2019 [employer]  Blood Transfusion, Adult    A blood transfusion is a procedure in which you receive donated blood, including plasma, platelets, and red blood cells, through an IV tube. You may need a blood transfusion because of illness, surgery, or injury. The blood may come from a donor. You may also be able to donate blood for yourself (autologous blood donation) before a surgery if you know that you might require a blood transfusion.  The blood given in a transfusion is made up of different types of cells. You may receive:   Red blood cells. These carry oxygen to the cells in the body.   White blood cells. These help you fight infections.   Platelets. These help your blood to clot.     Plasma. This is the liquid part of your blood and it helps with fluid imbalances.  If you have hemophilia or another clotting disorder, you may also receive other types of blood products.  Tell a health care provider about:   Any allergies you have.   All medicines you are taking, including vitamins, herbs, eye drops, creams, and over-the-counter medicines.   Any problems you or family members have had with anesthetic medicines.   Any blood disorders you have.   Any surgeries you have had.   Any medical conditions you have, including any recent fever or cold symptoms.   Whether you are pregnant or may be pregnant.   Any previous reactions you have had during a blood transfusion.  What are the risks?  Generally, this is a safe procedure. However, problems may occur, including:   Having an allergic reaction to something in the donated blood. Hives and itching may be symptoms of this type of reaction.   Fever. This may be a reaction to the white blood cells in the transfused blood. Nausea or chest pain may accompany a fever.   Iron overload. This can happen from having many transfusions.   Transfusion-related acute lung injury (TRALI). This is a rare  reaction that causes lung damage. The cause is not known.TRALI can occur within hours of a transfusion or several days later.   Sudden (acute) or delayed hemolytic reactions. This happens if your blood does not match the cells in your transfusion. Your body's defense system (immune system) may try to attack the new cells. This complication is rare. The symptoms include fever, chills, nausea, and low back pain or chest pain.   Infection or disease transmission. This is rare.  What happens before the procedure?   You will have a blood test to determine your blood type. This is necessary to know what kind of blood your body will accept and to match it to the donor blood.   If you are going to have a planned surgery, you may be able to do an autologous blood donation. This may be done in case you need to have a transfusion.   If you have had an allergic reaction to a transfusion in the past, you may be given medicine to help prevent a reaction. This medicine may be given to you by mouth or through an IV tube.   You will have your temperature, blood pressure, and pulse monitored before the transfusion.   Follow instructions from your health care provider about eating and drinking restrictions.   Ask your health care provider about:  ? Changing or stopping your regular medicines. This is especially important if you are taking diabetes medicines or blood thinners.  ? Taking medicines such as aspirin and ibuprofen. These medicines can thin your blood. Do not take these medicines before your procedure if your health care provider instructs you not to.  What happens during the procedure?   An IV tube will be inserted into one of your veins.   The bag of donated blood will be attached to your IV tube. The blood will then enter through your vein.   Your temperature, blood pressure, and pulse will be monitored regularly during the transfusion. This monitoring is done to detect early signs of a transfusion  reaction.   If you have any signs or symptoms of a reaction, your transfusion will be stopped and you may be given medicine.   When the transfusion is complete, your IV tube will be   removed.   Pressure may be applied to the IV site for a few minutes.   A bandage (dressing) will be applied.  The procedure may vary among health care providers and hospitals.  What happens after the procedure?   Your temperature, blood pressure, heart rate, breathing rate, and blood oxygen level will be monitored often.   Your blood may be tested to see how you are responding to the transfusion.   You may be warmed with fluids or blankets to maintain a normal body temperature.  Summary   A blood transfusion is a procedure in which you receive donated blood, including plasma, platelets, and red blood cells, through an IV tube.   Your temperature, blood pressure, and pulse will be monitored before, during, and after the transfusion.   Your blood may be tested after the transfusion to see how your body has responded.  This information is not intended to replace advice given to you by your health care provider. Make sure you discuss any questions you have with your health care provider.  Document Released: 07/07/2000 Document Revised: 04/06/2016 Document Reviewed: 04/06/2016  Elsevier Interactive Patient Education  2019 [employer]

## 2018-12-01 ENCOUNTER — Other Ambulatory Visit: Payer: Self-pay | Admitting: Oncology

## 2018-12-02 ENCOUNTER — Inpatient Hospital Stay: Payer: BC Managed Care – PPO

## 2018-12-02 ENCOUNTER — Telehealth: Payer: Self-pay | Admitting: Oncology

## 2018-12-02 ENCOUNTER — Telehealth: Payer: Self-pay | Admitting: *Deleted

## 2018-12-02 ENCOUNTER — Other Ambulatory Visit: Payer: Self-pay

## 2018-12-02 ENCOUNTER — Other Ambulatory Visit: Payer: Self-pay | Admitting: *Deleted

## 2018-12-02 DIAGNOSIS — E039 Hypothyroidism, unspecified: Secondary | ICD-10-CM | POA: Diagnosis not present

## 2018-12-02 DIAGNOSIS — Z8781 Personal history of (healed) traumatic fracture: Secondary | ICD-10-CM | POA: Diagnosis not present

## 2018-12-02 DIAGNOSIS — R0602 Shortness of breath: Secondary | ICD-10-CM | POA: Diagnosis not present

## 2018-12-02 DIAGNOSIS — Z9221 Personal history of antineoplastic chemotherapy: Secondary | ICD-10-CM | POA: Diagnosis not present

## 2018-12-02 DIAGNOSIS — D61818 Other pancytopenia: Secondary | ICD-10-CM | POA: Diagnosis not present

## 2018-12-02 DIAGNOSIS — C9110 Chronic lymphocytic leukemia of B-cell type not having achieved remission: Secondary | ICD-10-CM

## 2018-12-02 DIAGNOSIS — D801 Nonfamilial hypogammaglobulinemia: Secondary | ICD-10-CM | POA: Diagnosis not present

## 2018-12-02 DIAGNOSIS — Z79899 Other long term (current) drug therapy: Secondary | ICD-10-CM | POA: Diagnosis not present

## 2018-12-02 LAB — CMP (CANCER CENTER ONLY)
ALT: 29 U/L (ref 0–44)
AST: 18 U/L (ref 15–41)
Albumin: 4.3 g/dL (ref 3.5–5.0)
Alkaline Phosphatase: 89 U/L (ref 38–126)
Anion gap: 8 (ref 5–15)
BUN: 19 mg/dL (ref 6–20)
CO2: 28 mmol/L (ref 22–32)
Calcium: 9.8 mg/dL (ref 8.9–10.3)
Chloride: 106 mmol/L (ref 98–111)
Creatinine: 0.94 mg/dL (ref 0.44–1.00)
GFR, Est AFR Am: >60 mL/min (ref >60)
GFR, Estimated: >60 mL/min (ref >60)
Glucose, Bld: 112 mg/dL — ABNORMAL HIGH (ref 70–99)
Potassium: 4.2 mmol/L (ref 3.5–5.1)
Sodium: 142 mmol/L (ref 135–145)
Total Bilirubin: 0.5 mg/dL (ref 0.3–1.2)
Total Protein: 6.4 g/dL — ABNORMAL LOW (ref 6.5–8.1)

## 2018-12-02 LAB — CBC WITH DIFFERENTIAL (CANCER CENTER ONLY)
Abs Immature Granulocytes: 0 10*3/uL (ref 0.00–0.07)
Basophils Absolute: 0 10*3/uL (ref 0.0–0.1)
Basophils Relative: 0 %
Eosinophils Absolute: 0 10*3/uL (ref 0.0–0.5)
Eosinophils Relative: 1 %
HCT: 24.8 % — ABNORMAL LOW (ref 36.0–46.0)
Hemoglobin: 8.2 g/dL — ABNORMAL LOW (ref 12.0–15.0)
Lymphocytes Relative: 86 %
Lymphs Abs: 3.6 10*3/uL (ref 0.7–4.0)
MCH: 28.6 pg (ref 26.0–34.0)
MCHC: 33.1 g/dL (ref 30.0–36.0)
MCV: 86.4 fL (ref 80.0–100.0)
Monocytes Absolute: 0 10*3/uL — ABNORMAL LOW (ref 0.1–1.0)
Monocytes Relative: 0 %
Neutro Abs: 0.5 10*3/uL — ABNORMAL LOW (ref 1.7–17.7)
Neutrophils Relative %: 13 %
Platelet Count: 16 10*3/uL — ABNORMAL LOW (ref 150–400)
RBC: 2.87 MIL/uL — ABNORMAL LOW (ref 3.87–5.11)
RDW: 12.4 % (ref 11.5–15.5)
WBC Count: 4.2 10*3/uL (ref 4.0–10.5)
nRBC: 0 % (ref 0.0–0.2)

## 2018-12-02 LAB — PREPARE PLATELET PHERESIS: Unit division: 0

## 2018-12-02 LAB — TYPE AND SCREEN
ABO/RH(D): O POS
Antibody Screen: NEGATIVE
Unit division: 0

## 2018-12-02 LAB — SAMPLE TO BLOOD BANK

## 2018-12-02 LAB — BPAM RBC
Blood Product Expiration Date: 202005292359
ISSUE DATE / TIME: 202005090917
Unit Type and Rh: 5100

## 2018-12-02 LAB — BPAM PLATELET PHERESIS
Blood Product Expiration Date: 202005112359
ISSUE DATE / TIME: 202005090917
Unit Type and Rh: 7300

## 2018-12-02 MED ORDER — LIDOCAINE-PRILOCAINE 2.5-2.5 % EX CREA: 1.0000 "application " | TOPICAL_CREAM | CUTANEOUS | 0 refills | Status: DC | PRN

## 2018-12-02 NOTE — Telephone Encounter (Signed)
Left a voice message to inform patient that the treatment appt has been added.

## 2018-12-02 NOTE — Telephone Encounter (Signed)
Called patient back to report Dr. Anselm Pancoast has declined to leave port accessed after speaking with Dr. Benay Spice. Main concern is bleeding risk. Will let her know when the PAs are completed and that we can put ice on her port for her 1st access. EMLA cream has been sent in for her appointments next week. Made her aware that both drugs would be thousands of dollars to pay out of pocket.

## 2018-12-02 NOTE — Telephone Encounter (Signed)
Had provided patient her lab results this morning--stable per MD. Nplate still has not been approved per insurance, so it was not given today.Rituxan PA is also pending. Dr. Benay Spice will call MD with IR to determine if they will leave her port accessed on 4/13 for her treatment on 4/14.  Patient has called to report she called insurance company and was told they are awaiting medical records for Nplate. Do not have the Rituxan request yet (made managed care aware of this conversation). She is asking if it would be best to move everything to Friday? Also requests to know the out of pocket cost for Nplate and Rituxan (message sent to pharmacy for this info)?

## 2018-12-03 ENCOUNTER — Other Ambulatory Visit: Payer: Self-pay | Admitting: *Deleted

## 2018-12-03 ENCOUNTER — Encounter: Payer: Self-pay | Admitting: *Deleted

## 2018-12-03 ENCOUNTER — Other Ambulatory Visit: Payer: Self-pay | Admitting: Radiology

## 2018-12-03 DIAGNOSIS — C9110 Chronic lymphocytic leukemia of B-cell type not having achieved remission: Secondary | ICD-10-CM

## 2018-12-03 LAB — HEPATITIS B SURFACE ANTIGEN: Hepatitis B Surface Ag: NEGATIVE

## 2018-12-03 LAB — HEPATITIS B CORE ANTIBODY, TOTAL: Hep B Core Total Ab: NEGATIVE

## 2018-12-03 NOTE — Progress Notes (Signed)
Dr. Benay Spice requesting cross matched platelets for tomorrow prior to [facility] placement. Called blood bank and was informed this would require sample from today and then gets sent to [facility] to be processed and takes 2 days. Per Dr. Benay Spice, just arrange for 1 unit of platelets per usual for tomorrow. Informed blood bank and they will be on hold for her. They requested orders to prepare be placed today.

## 2018-12-04 ENCOUNTER — Other Ambulatory Visit: Payer: Self-pay

## 2018-12-04 ENCOUNTER — Ambulatory Visit (HOSPITAL_COMMUNITY)
Admission: RE | Admit: 2018-12-04 | Discharge: 2018-12-04 | Disposition: A | Discharge status: Home / Self Care | Payer: BLUE CROSS/BLUE SHIELD | Source: Ambulatory Visit | Attending: Oncology | Admitting: Oncology

## 2018-12-04 ENCOUNTER — Other Ambulatory Visit: Payer: Self-pay | Admitting: Oncology

## 2018-12-04 ENCOUNTER — Encounter (HOSPITAL_COMMUNITY): Payer: Self-pay

## 2018-12-04 DIAGNOSIS — Z975 Presence of (intrauterine) contraceptive device: Secondary | ICD-10-CM | POA: Diagnosis not present

## 2018-12-04 DIAGNOSIS — I341 Nonrheumatic mitral (valve) prolapse: Secondary | ICD-10-CM | POA: Insufficient documentation

## 2018-12-04 DIAGNOSIS — Z79890 Hormone replacement therapy (postmenopausal): Secondary | ICD-10-CM | POA: Diagnosis not present

## 2018-12-04 DIAGNOSIS — D649 Anemia, unspecified: Secondary | ICD-10-CM | POA: Diagnosis not present

## 2018-12-04 DIAGNOSIS — E039 Hypothyroidism, unspecified: Secondary | ICD-10-CM | POA: Insufficient documentation

## 2018-12-04 DIAGNOSIS — Z79899 Other long term (current) drug therapy: Secondary | ICD-10-CM | POA: Insufficient documentation

## 2018-12-04 DIAGNOSIS — C9110 Chronic lymphocytic leukemia of B-cell type not having achieved remission: Secondary | ICD-10-CM | POA: Insufficient documentation

## 2018-12-04 DIAGNOSIS — Z452 Encounter for adjustment and management of vascular access device: Secondary | ICD-10-CM | POA: Diagnosis not present

## 2018-12-04 DIAGNOSIS — D61818 Other pancytopenia: Secondary | ICD-10-CM | POA: Insufficient documentation

## 2018-12-04 LAB — CBC WITH DIFFERENTIAL/PLATELET
Abs Immature Granulocytes: 0 10*3/uL (ref 0.00–0.07)
Basophils Absolute: 0 10*3/uL (ref 0.0–0.1)
Basophils Relative: 0 %
Eosinophils Absolute: 0 10*3/uL (ref 0.0–0.5)
Eosinophils Relative: 0 %
HCT: 22.8 % — ABNORMAL LOW (ref 36.0–46.0)
Hemoglobin: 7.6 g/dL — ABNORMAL LOW (ref 12.0–15.0)
Immature Granulocytes: 0 %
Lymphocytes Relative: 81 %
Lymphs Abs: 2.3 10*3/uL (ref 0.7–4.0)
MCH: 29.6 pg (ref 26.0–34.0)
MCHC: 33.3 g/dL (ref 30.0–36.0)
MCV: 88.7 fL (ref 80.0–100.0)
Monocytes Absolute: 0.2 10*3/uL (ref 0.1–1.0)
Monocytes Relative: 6 %
Neutro Abs: 0.4 10*3/uL — ABNORMAL LOW (ref 1.7–7.7)
Neutrophils Relative %: 13 %
Platelets: 18 10*3/uL — CL (ref 150–400)
RBC: 2.57 MIL/uL — ABNORMAL LOW (ref 3.87–5.11)
RDW: 12.3 % (ref 11.5–15.5)
WBC: 2.8 10*3/uL — ABNORMAL LOW (ref 4.0–10.5)
nRBC: 0 % (ref 0.0–0.2)

## 2018-12-04 LAB — ABO/RH: ABO/RH(D): O POS

## 2018-12-04 LAB — PROTIME-INR
INR: 1.4 — ABNORMAL HIGH (ref 0.8–1.2)
Prothrombin Time: 16.5 seconds — ABNORMAL HIGH (ref 11.4–15.2)

## 2018-12-04 MED ORDER — SODIUM CHLORIDE 0.9% IV SOLUTION
Freq: Once | INTRAVENOUS | Status: AC
  Administered 2018-12-04: 14:00:00 via INTRAVENOUS

## 2018-12-04 MED ORDER — LIDOCAINE HCL 1 % IJ SOLN
INTRAMUSCULAR | Status: AC
  Filled 2018-12-04: qty 20

## 2018-12-04 MED ORDER — LIDOCAINE-EPINEPHRINE 1 %-1:100000 IJ SOLN
INTRAMUSCULAR | Status: AC
  Filled 2018-12-04: qty 1

## 2018-12-04 MED ORDER — CEFAZOLIN SODIUM-DEXTROSE 2-4 GM/100ML-% IV SOLN: 2.0000 g | INTRAVENOUS | Status: DC

## 2018-12-04 MED ORDER — DIPHENHYDRAMINE HCL 25 MG PO CAPS
25.0000 mg | ORAL_CAPSULE | Freq: Once | ORAL | Status: AC
  Administered 2018-12-04: 25 mg via ORAL
  Filled 2018-12-04: qty 1

## 2018-12-04 MED ORDER — CEFAZOLIN SODIUM-DEXTROSE 2-4 GM/100ML-% IV SOLN
INTRAVENOUS | Status: AC
  Filled 2018-12-04: qty 100

## 2018-12-04 MED ORDER — MIDAZOLAM HCL 2 MG/2ML IJ SOLN
INTRAMUSCULAR | Status: AC
  Filled 2018-12-04: qty 4

## 2018-12-04 MED ORDER — LIDOCAINE HCL 1 % IJ SOLN
INTRAMUSCULAR | Status: DC | PRN
  Administered 2018-12-04: 5 mL

## 2018-12-04 MED ORDER — FENTANYL CITRATE (PF) 100 MCG/2ML IJ SOLN
INTRAMUSCULAR | Status: AC
  Filled 2018-12-04: qty 2

## 2018-12-04 MED ORDER — ACETAMINOPHEN 325 MG PO TABS
650.0000 mg | ORAL_TABLET | Freq: Once | ORAL | Status: AC
  Administered 2018-12-04: 650 mg via ORAL
  Filled 2018-12-04: qty 2

## 2018-12-04 MED ORDER — SODIUM CHLORIDE 0.9 % IV SOLN
INTRAVENOUS | Status: DC
  Administered 2018-12-04: 14:00:00 via INTRAVENOUS

## 2018-12-04 MED ORDER — HEPARIN SOD (PORK) LOCK FLUSH 100 UNIT/ML IV SOLN
INTRAVENOUS | Status: AC
  Filled 2018-12-04: qty 5

## 2018-12-04 NOTE — Progress Notes (Signed)
Dr. Anselm Pancoast notified of Platelets of 18

## 2018-12-04 NOTE — Progress Notes (Signed)
[place] notified of Platelets of 9.

## 2018-12-04 NOTE — Discharge Instructions (Signed)
PICC Home Care Guide ° °A peripherally inserted central catheter (PICC) is a form of IV access that allows medicines and IV fluids to be quickly distributed throughout the body. The PICC is a long, thin, flexible tube (catheter) that is inserted into a vein in the upper arm. The catheter ends in a large vein in the chest (superior vena cava, or SVC). After the PICC is inserted, a chest X-ray may be done to make sure that it is in the correct place. °A PICC may be placed for different reasons, such as: °· To give medicines and liquid nutrition. °· To give IV fluids and blood products. °· If there is trouble placing a peripheral intravenous (PIV) catheter. °If taken care of properly, a PICC can remain in place for several months. Having a PICC can also allow a person to go home from the hospital sooner. Medicine and PICC care can be managed at home by a family member, caregiver, or home health care team. °What are the risks? °Generally, having a PICC is safe. However, problems may occur, including: °· A blood clot (thrombus) forming in or at the tip of the PICC. °· A blood clot forming in a vein (deep vein thrombosis) or traveling to the lung (pulmonary embolism). °· Inflammation of the vein (phlebitis) in which the PICC is placed. °· Infection. Central line associated blood stream infection (CLABSI) is a serious infection that often requires hospitalization. °· PICC movement (malposition). The PICC tip may move from its original position due to excessive physical activity, forceful coughing, sneezing, or vomiting. °· A break or cut in the PICC. It is important not to use scissors near the PICC. °· Nerve or tendon irritation or injury during PICC insertion. °How to take care of your PICC °Preventing problems °· You and any caregivers should wash your hands often with soap. Wash hands: °? Before touching the PICC line or the infusion device. °? Before changing a bandage (dressing). °· Flush the PICC as told by your  health care provider. Let your health care provider know right away if the PICC is hard to flush or does not flush. Do not use force to flush the PICC. °· Do not use a syringe that is less than 10 mL to flush the PICC. °· Avoid blood pressure checks on the arm in which the PICC is placed. °· Never pull or tug on the PICC. °· Do not take the PICC out yourself. Only a trained clinical professional should remove the PICC. °· Use clean and sterile supplies only. Keep the supplies in a dry place. Do not reuse needles, syringes, or any other supplies. Doing that can lead to infection. °· Keep pets and children away from your PICC line. °· Check the PICC insertion site every day for signs of infection. Check for: °? Leakage. °? Redness, swelling, or pain. °? Fluid or blood. °? Warmth. °? Pus or a bad smell. °PICC dressing care °· Keep your PICC bandage (dressing) clean and dry to prevent infection. °· Do not take baths, swim, or use a hot tub until your health care provider approves. Ask your health care provider if you can take showers. You may only be allowed to take sponge baths for bathing. When you are allowed to shower: °? Ask your health care provider to teach you how to wrap the PICC line. °? Cover the PICC line with clear plastic wrap and tape to keep it dry while showering. °· Follow instructions from your health care provider   about how to take care of your insertion site and dressing. Make sure you: °? Wash your hands with soap and water before you change your bandage (dressing). If soap and water are not available, use hand sanitizer. °? Change your dressing as told by your health care provider. °? Leave stitches (sutures), skin glue, or adhesive strips in place. These skin closures may need to stay in place for 2 weeks or longer. If adhesive strip edges start to loosen and curl up, you may trim the loose edges. Do not remove adhesive strips completely unless your health care provider tells you to do  that. °· Change your PICC dressing if it becomes loose or wet. °General instructions ° °· Carry your PICC identification card or wear a medical alert bracelet at all times. °· Keep the tube clamped at all times, unless it is being used. °· Carry a smooth-edge clamp with you at all times to place on the tube if it breaks. °· Do not use scissors or sharp objects near the tube. °· You may bend your arm and move it freely. If your PICC is near or at the bend of your elbow, avoid activity with repeated motion at the elbow. °· Avoid lifting heavy objects as told by your health care provider. °· Keep all follow-up visits as told by your health care provider. This is important. °Disposal of supplies °· Throw away any syringes in a disposal container that is meant for sharp items (sharps container). You can buy a sharps container from a pharmacy, or you can make one by using an empty hard plastic bottle with a cover. °· Place any used dressings or infusion bags into a plastic bag. Throw that bag in the trash. °Contact a health care provider if: °· You have pain in your arm, ear, face, or teeth. °· You have a fever or chills. °· You have redness, swelling, or pain around the insertion site. °· You have fluid or blood coming from the insertion site. °· Your insertion site feels warm to the touch. °· You have pus or a bad smell coming from the insertion site. °· Your skin feels hard and raised around the insertion site. °Get help right away if: °· Your PICC is accidentally pulled all the way out. If this happens, cover the insertion site with a bandage or gauze dressing. Do not throw the PICC away. Your health care provider will need to check it. °· Your PICC was tugged or pulled and has partially come out. Do not  push the PICC back in. °· You cannot flush the PICC, it is hard to flush, or the PICC leaks around the insertion site when it is flushed. °· You hear a "flushing" sound when the PICC is flushed. °· You feel your  heart racing or skipping beats. °· There is a hole or tear in the PICC. °· You have swelling in the arm in which the PICC was inserted. °· You have a red streak going up your arm from where the PICC was inserted. °Summary °· A peripherally inserted central catheter (PICC) is a long, thin, flexible tube (catheter) that is inserted into a vein in the upper arm. °· The PICC is inserted using a sterile technique by a specially trained nurse or physician. Only a trained clinical professional should remove it. °· Keep your PICC identification card with you at all times. °· Avoid blood pressure checks on the arm in which the PICC is placed. °· If cared for   properly, a PICC can remain in place for several months. Having a PICC can also allow a person to go home from the hospital sooner. °This information is not intended to replace advice given to you by your health care provider. Make sure you discuss any questions you have with your health care provider. °Document Released: 01/14/2003 Document Revised: 08/12/2016 Document Reviewed: 08/12/2016 °Elsevier Interactive Patient Education © 2019 [employer] ° °

## 2018-12-04 NOTE — Procedures (Signed)
Interventional Radiology Procedure:   Indications: History of CLL and thrombocytopenia  Procedure: PICC placement  Findings: Left basilic PICC, tip at SVC/RA junction, length = 39 cm, single lumen  Complications: None     EBL: Minimal, less than 10 ml  Plan: Discharge to home. PICC is ready to use.     Alycen Mack R. Anselm Pancoast, MD  Pager: [phone]

## 2018-12-04 NOTE — H&P (Addendum)
Referring Physician(s): Ladell Pier  Supervising Physician: Markus Daft  Patient Status:  [facility]  Chief Complaint:  "I'm getting a port a cath"  Subjective: Patient familiar to IR service from prior left inguinal lymph node biopsy on 09/18/2017 and bone marrow biopsies on 02/26/2018, 08/05/2018 and 11/21/2018.  She has a history of CLL and pancytopenia and presents today for Port-A-Cath placement for needed transfusions.  She currently denies fever, headache, chest pain, dyspnea, cough, abdominal/back pain, nausea, vomiting or bleeding.   Past Medical History:  Diagnosis Date  . Anxiety   . Cancer ([place])    CLL  . Degenerative disk disease    Neck--chronic pain  . Depression   . Fibroids    Uterine  . 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 Hypothyroidism   . Lymphocytosis   . Mitral valve prolapse   . Mitral valve prolapse   . Thyroid disease    Hypothyroid   Past Surgical History:  Procedure Laterality Date  . BREAST BIOPSY Left 2000  . CESAREAN SECTION  1996  . MELANOMA EXCISION WITH SENTINEL LYMPH NODE BIOPSY Left 10/01/2017   Procedure: EXCISIONAL BIOPSY OF LEFT INGUINAL LYMPH NODE;  Surgeon: Greer Pickerel, MD;  Location: [facility];  Service: General;  Laterality: Left;  . REVISION OF ABDOMINAL SCAR    . TONSILLECTOMY     age 58      Allergies: Patient has no known allergies.  Medications: Prior to Admission medications   Medication Sig Start Date End Date Taking? Authorizing Provider  acetaminophen (TYLENOL) 325 MG tablet Take 650 mg by mouth every 6 (six) hours as needed.    [provider]  ALPRAZolam Duanne Moron) 0.25 MG tablet TAKE 1 TO 2 TABLETS BY MOUTH EVERY DAY AS NEEDED FOR ANXIETY 09/11/18   Cottle, Billey Co., MD  ciprofloxacin (CIPRO) 500 MG tablet Take 1 tablet (500 mg total) by mouth 2 (two) times daily. Start for sign/symptom of infection or temp > 101 Patient not taking: Reported on 07/29/2018 07/15/18    Ladell Pier, MD  HYDROcodone-acetaminophen (NORCO/VICODIN) 5-325 MG tablet Take 1 tablet by mouth every 4 (four) hours as needed for moderate pain. Patient not taking: Reported on 07/29/2018 07/04/18   Ladell Pier, MD  lamoTRIgine (LAMICTAL) 200 MG tablet TAKE 1 TABLET BY MOUTH TWICE A DAY 10/04/18   Cottle, Billey Co., MD  levofloxacin (LEVAQUIN) 500 MG tablet Take 1 tablet (500 mg total) by mouth daily. X 5 days 10/08/18   Ladell Pier, MD  levonorgestrel [facility]) 20 MCG/24HR IUD 1 each by Intrauterine route once.     [provider]  levothyroxine (SYNTHROID, LEVOTHROID) 100 MCG tablet Take 100 mcg by mouth daily.    [provider]  lidocaine-prilocaine (EMLA) cream Apply 1 application topically as needed. Apply 1 hour prior to stick and cover with plastic wrap 12/02/18   Ladell Pier, MD  lithium carbonate (LITHOBID) 300 MG CR tablet Take 2 tablets (600 mg total) by mouth at bedtime. 10/07/18   Cottle, Billey Co., MD  oxyCODONE-acetaminophen (PERCOCET/ROXICET) 5-325 MG tablet Take 1 tablet by mouth every 6 (six) hours as needed for severe pain. 07/05/18   Owens Shark, NP  traMADol (ULTRAM) 50 MG tablet TAKE 1 TABLET BY MOUTH EVERY 8 HOURS AS NEEDED 07/03/18   Ladell Pier, MD  vortioxetine HBr (TRINTELLIX) 20 MG TABS tablet Take 1 tablet (20 mg total) by mouth  daily. 10/10/18   Cottle, Billey Co., MD  zolpidem (AMBIEN) 10 MG tablet TAKE 1/2 TO 1 TABLET BY MOUTH AS NEEDED FOR INSOMNIA. 11/06/18   Cottle, Billey Co., MD     Vital Signs: BP (!) 108/59   Pulse 74   Temp 98.1 F (36.7 C) (Oral)   Resp 16   SpO2 100%   Physical Exam awake, alert.  Chest clear to auscultation bilaterally.  Heart with regular rate/ rhythm.  Abdomen soft, positive bowel sounds, nontender.  No lower extremity edema.  Imaging: No results found.  Labs:  CBC: Recent Labs    11/25/18 0901 11/28/18 0937 11/29/18 1026 12/02/18 1021  WBC 5.8 4.5 5.5 4.2  HGB 8.7* 8.3*  7.5* 8.2*  HCT 26.7* 25.0* 23.0* 24.8*  PLT 23* 10* 16* 16*    COAGS: Recent Labs    02/26/18 0724 08/05/18 0746 11/21/18 0738  INR 1.31 1.33 1.3*    BMP: Recent Labs    11/12/18 0957 11/20/18 0817 11/28/18 0937 12/02/18 1021  NA 142 143 143 142  K 4.1 4.5 4.1 4.2  CL 107 109 108 106  CO2 27 27 28 28   GLUCOSE 100* 102* 132* 112*  BUN 18 21* 17 19  CALCIUM 9.5 9.4 9.4 9.8  CREATININE 0.82 0.85 0.85 0.94  GFRNONAA >60 >60 >60 >60  GFRAA >60 >60 >60 >60    LIVER FUNCTION TESTS: Recent Labs    11/01/18 1124 11/12/18 0957 11/20/18 0817 12/02/18 1021  BILITOT 0.5 0.6 0.4 0.5  AST 18 15 15 18   ALT 18 17 16 29   ALKPHOS 84 83 84 89  PROT 6.6 5.8* 6.0* 6.4*  ALBUMIN 4.5 4.1 4.2 4.3    Assessment and Plan: Pt with history of CLL as well as pancytopenia ; presents today for Port-A-Cath placement for needed transfusions.Risks and benefits of image guided port-a-catheter placement was discussed with the patient including, but not limited to bleeding, infection, pneumothorax, or fibrin sheath development and need for additional procedures.  Patient scheduled for platelet transfusion preprocedure.  All of the patient's questions were answered, patient is agreeable to proceed. Consent signed and in chart.  LABS PENDING     Addendum: plts 9k preprocedure and 18k post plt transfusion (1 unit); case d/w Dr. Benay Spice- will plan to place PICC instead of port today; details/risks of procedure, including but not limited to, internal bleeding, infection, injury to adjacent structures discussed with patient with her understanding and consent.   Electronically Signed: D. Rowe Robert, PA-C 12/04/2018, 1:24 PM   I spent a total of 20 minutes at the the patient's bedside AND on the patient's hospital floor or unit, greater than 50% of which was counseling/coordinating care for port a cath placement

## 2018-12-05 ENCOUNTER — Encounter: Payer: Self-pay | Admitting: *Deleted

## 2018-12-05 ENCOUNTER — Inpatient Hospital Stay (HOSPITAL_BASED_OUTPATIENT_CLINIC_OR_DEPARTMENT_OTHER): Payer: BC Managed Care – PPO | Admitting: Oncology

## 2018-12-05 ENCOUNTER — Inpatient Hospital Stay: Payer: BC Managed Care – PPO

## 2018-12-05 ENCOUNTER — Other Ambulatory Visit: Payer: Self-pay | Admitting: *Deleted

## 2018-12-05 ENCOUNTER — Telehealth: Payer: Self-pay | Admitting: Oncology

## 2018-12-05 ENCOUNTER — Other Ambulatory Visit: Payer: Self-pay

## 2018-12-05 VITALS — BP 109/60 | HR 95 | Temp 98.5°F | Resp 18 | Ht 66.0 in | Wt 132.6 lb

## 2018-12-05 VITALS — BP 100/68 | HR 72 | Temp 98.0°F | Resp 18

## 2018-12-05 DIAGNOSIS — Z8781 Personal history of (healed) traumatic fracture: Secondary | ICD-10-CM

## 2018-12-05 DIAGNOSIS — Z79899 Other long term (current) drug therapy: Secondary | ICD-10-CM | POA: Diagnosis not present

## 2018-12-05 DIAGNOSIS — E039 Hypothyroidism, unspecified: Secondary | ICD-10-CM | POA: Diagnosis not present

## 2018-12-05 DIAGNOSIS — R0602 Shortness of breath: Secondary | ICD-10-CM

## 2018-12-05 DIAGNOSIS — D801 Nonfamilial hypogammaglobulinemia: Secondary | ICD-10-CM | POA: Diagnosis not present

## 2018-12-05 DIAGNOSIS — D61818 Other pancytopenia: Secondary | ICD-10-CM | POA: Diagnosis not present

## 2018-12-05 DIAGNOSIS — C9110 Chronic lymphocytic leukemia of B-cell type not having achieved remission: Secondary | ICD-10-CM

## 2018-12-05 DIAGNOSIS — D649 Anemia, unspecified: Secondary | ICD-10-CM

## 2018-12-05 DIAGNOSIS — D696 Thrombocytopenia, unspecified: Secondary | ICD-10-CM

## 2018-12-05 DIAGNOSIS — Z9221 Personal history of antineoplastic chemotherapy: Secondary | ICD-10-CM

## 2018-12-05 LAB — CBC WITH DIFFERENTIAL (CANCER CENTER ONLY)
Abs Immature Granulocytes: 0 10*3/uL (ref 0.00–0.07)
Basophils Absolute: 0 10*3/uL (ref 0.0–0.1)
Basophils Relative: 0 %
Eosinophils Absolute: 0 10*3/uL (ref 0.0–0.5)
Eosinophils Relative: 0 %
HCT: 18 % — ABNORMAL LOW (ref 36.0–46.0)
Hemoglobin: 5.9 g/dL — CL (ref 12.0–15.0)
Immature Granulocytes: 0 %
Lymphocytes Relative: 89 %
Lymphs Abs: 2.5 10*3/uL (ref 0.7–4.0)
MCH: 28.9 pg (ref 26.0–34.0)
MCHC: 32.8 g/dL (ref 30.0–36.0)
MCV: 88.2 fL (ref 80.0–100.0)
Monocytes Absolute: 0 10*3/uL — ABNORMAL LOW (ref 0.1–1.0)
Monocytes Relative: 1 %
Neutro Abs: 0.3 10*3/uL — CL (ref 1.7–7.7)
Neutrophils Relative %: 10 %
Platelet Count: 7 10*3/uL — CL (ref 150–400)
RBC: 2.04 MIL/uL — ABNORMAL LOW (ref 3.87–5.11)
RDW: 12.1 % (ref 11.5–15.5)
WBC Count: 2.8 10*3/uL — ABNORMAL LOW (ref 4.0–10.5)
nRBC: 0 % (ref 0.0–0.2)

## 2018-12-05 LAB — CBC WITH DIFFERENTIAL/PLATELET
Abs Immature Granulocytes: 0 10*3/uL (ref 0.00–0.07)
Basophils Absolute: 0 10*3/uL (ref 0.0–0.1)
Basophils Relative: 0 %
Eosinophils Absolute: 0 10*3/uL (ref 0.0–0.5)
Eosinophils Relative: 0 %
HCT: 24.5 % — ABNORMAL LOW (ref 36.0–46.0)
Hemoglobin: 8.2 g/dL — ABNORMAL LOW (ref 12.0–15.0)
Immature Granulocytes: 0 %
Lymphocytes Relative: 88 %
Lymphs Abs: 3.6 10*3/uL (ref 0.7–4.0)
MCH: 29.5 pg (ref 26.0–34.0)
MCHC: 33.5 g/dL (ref 30.0–36.0)
MCV: 88.1 fL (ref 80.0–100.0)
Monocytes Absolute: 0.2 10*3/uL (ref 0.1–1.0)
Monocytes Relative: 4 %
Neutro Abs: 0.3 10*3/uL — ABNORMAL LOW (ref 1.7–7.7)
Neutrophils Relative %: 8 %
Platelets: 9 10*3/uL — CL (ref 150–400)
RBC: 2.78 MIL/uL — ABNORMAL LOW (ref 3.87–5.11)
RDW: 12.3 % (ref 11.5–15.5)
WBC: 4.1 10*3/uL (ref 4.0–10.5)
nRBC: 0 % (ref 0.0–0.2)

## 2018-12-05 LAB — BPAM PLATELET PHERESIS
Blood Product Expiration Date: 202005132359
ISSUE DATE / TIME: 202005131404
Unit Type and Rh: 6200

## 2018-12-05 LAB — PREPARE PLATELET PHERESIS: Unit division: 0

## 2018-12-05 LAB — SAMPLE TO BLOOD BANK

## 2018-12-05 LAB — HEMOGLOBIN AND HEMATOCRIT (CANCER CENTER ONLY)
HCT: 24.2 % — ABNORMAL LOW (ref 36.0–46.0)
Hemoglobin: 8.1 g/dL — ABNORMAL LOW (ref 12.0–15.0)

## 2018-12-05 LAB — PREPARE RBC (CROSSMATCH)

## 2018-12-05 MED ORDER — TBO-FILGRASTIM 300 MCG/0.5ML ~~LOC~~ SOSY
PREFILLED_SYRINGE | SUBCUTANEOUS | Status: AC
  Filled 2018-12-05: qty 0.5

## 2018-12-05 MED ORDER — DIPHENHYDRAMINE HCL 25 MG PO CAPS
25.0000 mg | ORAL_CAPSULE | Freq: Once | ORAL | Status: AC
  Administered 2018-12-05: 25 mg via ORAL

## 2018-12-05 MED ORDER — CYCLOBENZAPRINE HCL 10 MG PO TABS: 10.0000 mg | ORAL_TABLET | Freq: Two times a day (BID) | ORAL | 0 refills | Status: DC | PRN

## 2018-12-05 MED ORDER — TBO-FILGRASTIM 300 MCG/0.5ML ~~LOC~~ SOSY
300.0000 ug | PREFILLED_SYRINGE | Freq: Once | SUBCUTANEOUS | Status: AC
  Administered 2018-12-05: 300 ug via SUBCUTANEOUS

## 2018-12-05 MED ORDER — ROMIPLOSTIM 250 MCG ~~LOC~~ SOLR
1.0000 ug/kg | Freq: Once | SUBCUTANEOUS | Status: AC
  Administered 2018-12-05: 12:00:00 60 ug via SUBCUTANEOUS
  Filled 2018-12-05: qty 0.12

## 2018-12-05 MED ORDER — SODIUM CHLORIDE 0.9% IV SOLUTION
250.0000 mL | Freq: Once | INTRAVENOUS | Status: AC
  Administered 2018-12-05: 250 mL via INTRAVENOUS
  Filled 2018-12-05: qty 250

## 2018-12-05 MED ORDER — ACETAMINOPHEN 325 MG PO TABS
650.0000 mg | ORAL_TABLET | Freq: Once | ORAL | Status: AC
  Administered 2018-12-05: 650 mg via ORAL

## 2018-12-05 MED ORDER — DIPHENHYDRAMINE HCL 25 MG PO CAPS
ORAL_CAPSULE | ORAL | Status: AC
  Filled 2018-12-05: qty 1

## 2018-12-05 MED ORDER — SODIUM CHLORIDE 0.9% FLUSH
10.0000 mL | INTRAVENOUS | Status: AC | PRN
  Administered 2018-12-05: 10 mL
  Filled 2018-12-05: qty 10

## 2018-12-05 MED ORDER — ACETAMINOPHEN 325 MG PO TABS
ORAL_TABLET | ORAL | Status: AC
  Filled 2018-12-05: qty 2

## 2018-12-05 MED ORDER — HEPARIN SOD (PORK) LOCK FLUSH 100 UNIT/ML IV SOLN
250.0000 [IU] | INTRAVENOUS | Status: AC | PRN
  Administered 2018-12-05: 250 [IU]
  Filled 2018-12-05: qty 5

## 2018-12-05 NOTE — Patient Instructions (Signed)
Feel free to call the clinic should you have any questions or concerns. The clinic phone number is [phone].  Please show the [facility] at check-in to the Emergency Department and triage nurse.  Platelet Transfusion A platelet transfusion is a procedure in which you receive donated platelets through an IV. Platelets are tiny pieces of blood cells. When you get an injury, platelets clump together in the area to form a blood clot. This helps stop bleeding and is the beginning of the healing process. If you have too few platelets, your blood may have trouble clotting. This may cause you to bleed and bruise very easily. You may need a platelet transfusion if you have a condition that causes a low number of platelets (thrombocytopenia). A platelet transfusion may be used to stop or prevent excessive bleeding. Tell a health care provider about:  Any reactions you have had during previous transfusions.  Any allergies you have.  All medicines you are taking, including vitamins, herbs, eye drops, creams, and over-the-counter medicines.  Any blood disorders you have.  Any surgeries you have had.  Any medical conditions you have.  Whether you are pregnant or may be pregnant. What are the risks? Generally, this is a safe procedure. However, problems may occur, including:  Fever.  Infection.  Allergic reaction to the donor platelets.  Your body's disease-fighting system (immune system) attacking the donor platelets (hemolytic reaction). This is rare.  A rare reaction that causes lung damage (transfusion-related acute lung injury). What happens before the procedure? Medicines  Ask your health care provider about: ? Changing or stopping your regular medicines. This is especially important if you are taking diabetes medicines or blood thinners. ? Taking medicines such as aspirin and ibuprofen. These medicines can thin your blood. Do not take these medicines unless your health  care provider tells you to take them. ? Taking over-the-counter medicines, vitamins, herbs, and supplements. General instructions  You will have a blood test to determine your blood type. Your blood type determines what kind of platelets you will be given.  Follow instructions from your health care provider about eating or drinking restrictions.  If you have had an allergic reaction to a transfusion in the past, you may be given medicine to help prevent a reaction.  Your temperature, blood pressure, pulse, and breathing will be monitored. What happens during the procedure?   An IV will be inserted into one of your veins.  For your safety, two health care providers will verify your identity along with the donor platelets about to be infused.  A bag of donor platelets will be connected to your IV. The platelets will flow into your bloodstream. This usually takes 30-60 minutes.  Your temperature, blood pressure, pulse, and breathing will be monitored during the transfusion. This helps detect early signs of any reaction.  You will also be monitored for other symptoms that may indicate a reaction, including chills, hives, or itching.  If you have signs of a reaction at any time, your transfusion will be stopped, and you may be given medicine to help manage the reaction.  When your transfusion is complete, your IV will be removed.  Pressure may be applied to the IV site for a few minutes to stop any bleeding.  The IV site will be covered with a bandage (dressing). The procedure may vary among health care providers and hospitals. What happens after the procedure?  Your blood pressure, temperature, pulse, and breathing will be monitored until  you leave the hospital or clinic.  You may have some bruising and soreness at your IV site. Follow these instructions at home: Medicines  Take over-the-counter and prescription medicines only as told by your health care provider.  Talk with  your health care provider before you take any medicines that contain aspirin or NSAIDs. These medicines increase your risk for dangerous bleeding. General instructions  Change or remove your dressing as told by your health care provider.  Return to your normal activities as told by your health care provider. Ask your health care provider what activities are safe for you.  Do not take baths, swim, or use a hot tub until your health care provider approves. Ask your health care provider if you may take showers.  Check your IV site every day for signs of infection. Check for: ? Redness, swelling, or pain. ? Fluid or blood. If fluid or blood drains from your IV site, use your hands to press down firmly on a bandage covering the area for a minute or two. Doing this should stop the bleeding. ? Warmth. ? Pus or a bad smell.  Keep all follow-up visits as told by your health care provider. This is important. Contact a health care provider if you have:  A headache that does not go away with medicine.  Hives, rash, or itchy skin.  Nausea or vomiting.  Unusual tiredness or weakness.  Signs of infection at your IV site. Get help right away if:  You have a fever or chills.  You urinate less often than usual.  Your urine is darker colored than normal.  You have any of the following: ? Trouble breathing. ? Pain in your back, abdomen, or chest. ? Cool, clammy skin. ? A fast heartbeat. Summary  Platelets are tiny pieces of blood cells that clump together to form a blood clot when you have an injury. If you have too few platelets, your blood may have trouble clotting.  A platelet transfusion is a procedure in which you receive donated platelets through an IV.  A platelet transfusion may be used to stop or prevent excessive bleeding.  After the procedure, check your IV site every day for signs of infection, including redness, swelling, pain, or warmth. This information is not intended to  replace advice given to you by your health care provider. Make sure you discuss any questions you have with your health care provider. Document Released: 05/07/2007 Document Revised: 08/15/2017 Document Reviewed: 08/15/2017 Elsevier Interactive Patient Education  2019 [place].   Blood Transfusion, Adult  A blood transfusion is a procedure in which you receive donated blood, including plasma, platelets, and red blood cells, through an IV tube. You may need a blood transfusion because of illness, surgery, or injury. The blood may come from a donor. You may also be able to donate blood for yourself (autologous blood donation) before a surgery if you know that you might require a blood transfusion. The blood given in a transfusion is made up of different types of cells. You may receive:  Red blood cells. These carry oxygen to the cells in the body.  White blood cells. These help you fight infections.  Platelets. These help your blood to clot.  Plasma. This is the liquid part of your blood and it helps with fluid imbalances. If you have hemophilia or another clotting disorder, you may also receive other types of blood products. Tell a health care provider about:  Any allergies you have.  All medicines  you are taking, including vitamins, herbs, eye drops, creams, and over-the-counter medicines.  Any problems you or family members have had with anesthetic medicines.  Any blood disorders you have.  Any surgeries you have had.  Any medical conditions you have, including any recent fever or cold symptoms.  Whether you are pregnant or may be pregnant.  Any previous reactions you have had during a blood transfusion. What are the risks? Generally, this is a safe procedure. However, problems may occur, including:  Having an allergic reaction to something in the donated blood. Hives and itching may be symptoms of this type of reaction.  Fever. This may be a reaction to the white blood  cells in the transfused blood. Nausea or chest pain may accompany a fever.  Iron overload. This can happen from having many transfusions.  Transfusion-related acute lung injury (TRALI). This is a rare reaction that causes lung damage. The cause is not known.TRALI can occur within hours of a transfusion or several days later.  Sudden (acute) or delayed hemolytic reactions. This happens if your blood does not match the cells in your transfusion. Your body's defense system (immune system) may try to attack the new cells. This complication is rare. The symptoms include fever, chills, nausea, and low back pain or chest pain.  Infection or disease transmission. This is rare. What happens before the procedure?  You will have a blood test to determine your blood type. This is necessary to know what kind of blood your body will accept and to match it to the donor blood.  If you are going to have a planned surgery, you may be able to do an autologous blood donation. This may be done in case you need to have a transfusion.  If you have had an allergic reaction to a transfusion in the past, you may be given medicine to help prevent a reaction. This medicine may be given to you by mouth or through an IV tube.  You will have your temperature, blood pressure, and pulse monitored before the transfusion.  Follow instructions from your health care provider about eating and drinking restrictions.  Ask your health care provider about: ? Changing or stopping your regular medicines. This is especially important if you are taking diabetes medicines or blood thinners. ? Taking medicines such as aspirin and ibuprofen. These medicines can thin your blood. Do not take these medicines before your procedure if your health care provider instructs you not to. What happens during the procedure?  An IV tube will be inserted into one of your veins.  The bag of donated blood will be attached to your IV tube. The blood  will then enter through your vein.  Your temperature, blood pressure, and pulse will be monitored regularly during the transfusion. This monitoring is done to detect early signs of a transfusion reaction.  If you have any signs or symptoms of a reaction, your transfusion will be stopped and you may be given medicine.  When the transfusion is complete, your IV tube will be removed.  Pressure may be applied to the IV site for a few minutes.  A bandage (dressing) will be applied. The procedure may vary among health care providers and hospitals. What happens after the procedure?  Your temperature, blood pressure, heart rate, breathing rate, and blood oxygen level will be monitored often.  Your blood may be tested to see how you are responding to the transfusion.  You may be warmed with fluids or blankets to maintain  a normal body temperature. Summary  A blood transfusion is a procedure in which you receive donated blood, including plasma, platelets, and red blood cells, through an IV tube.  Your temperature, blood pressure, and pulse will be monitored before, during, and after the transfusion.  Your blood may be tested after the transfusion to see how your body has responded. This information is not intended to replace advice given to you by your health care provider. Make sure you discuss any questions you have with your health care provider. Document Released: 07/07/2000 Document Revised: 04/06/2016 Document Reviewed: 04/06/2016 Elsevier Interactive Patient Education  [employer].

## 2018-12-05 NOTE — Progress Notes (Signed)
Blood bank notified of need for 2 units blood and 1 unit platelets today. They require an H & H check after 1st unit to confirm the 2nd unit will be needed. MD also requesting to have crossmatched platelets for her on Monday, which requires 2 days to process. Needs to be drawn tomorrow in pink blood bank tube. Order it by placing orders for platelets to be prepared on 12/06/18 and note in comments to crossmatch platelets. MD will defer Rituxan for 12/06/18.

## 2018-12-05 NOTE — Progress Notes (Signed)
[place] OFFICE PROGRESS NOTE   Diagnosis: CLL, pancytopenia  INTERVAL HISTORY:   Meagan Walsh returns as scheduled.  She underwent PICC placement yesterday.  No bleeding or fever.  She has chronic discomfort at the upper back and neck and requests a muscle relaxant.  Bruises are healing.  Objective:  Vital signs in last 24 hours:  Blood pressure 109/60, pulse 95, temperature 98.5 F (36.9 C), temperature source Oral, resp. rate 18, height '5\' 6"'  (1.676 m), weight 132 lb 9.6 oz (60.1 kg), SpO2 100 %.    HEENT: No thrush, few petechiae at the left buccal mucosa Resp: Lungs clear bilaterally Cardio: Regular rate and rhythm GI: No hepatosplenomegaly Vascular: No leg edema  Skin: Few petechiae at the lower legs  Portacath/PICC-without erythema  Lab Results:  Lab Results  Component Value Date   WBC 2.8 (L) 12/05/2018   HGB 5.9 (LL) 12/05/2018   HCT 18.0 (L) 12/05/2018   MCV 88.2 12/05/2018   PLT 7 (LL) 12/05/2018   NEUTROABS 0.3 (LL) 12/05/2018    CMP  Lab Results  Component Value Date   NA 142 12/02/2018   K 4.2 12/02/2018   CL 106 12/02/2018   CO2 28 12/02/2018   GLUCOSE 112 (H) 12/02/2018   BUN 19 12/02/2018   CREATININE 0.94 12/02/2018   CALCIUM 9.8 12/02/2018   PROT 6.4 (L) 12/02/2018   ALBUMIN 4.3 12/02/2018   AST 18 12/02/2018   ALT 29 12/02/2018   ALKPHOS 89 12/02/2018   BILITOT 0.5 12/02/2018   GFRNONAA >60 12/02/2018   GFRAA >60 12/02/2018     Imaging:  Irpicc Placement Left >5 Yrs Inc Img Guide  Result Date: 12/04/2018 INDICATION: 58 year old with history of CLL and pancytopenia. Patient requires multiple transfusions. Patient was scheduled for Port-A-Cath but switched to PICC line placement due to severe thrombocytopenia that did not respond adequately to a platelet transfusion. EXAM: PICC LINE PLACEMENT WITH ULTRASOUND AND FLUOROSCOPIC GUIDANCE MEDICATIONS: None ANESTHESIA/SEDATION: None FLUOROSCOPY TIME:  Fluoroscopy Time: 12  seconds, 1 mGy COMPLICATIONS: None immediate. PROCEDURE: The patient was advised of the possible risks and complications and agreed to undergo the procedure. The patient was then brought to the angiographic suite for the procedure. The left arm was prepped with chlorhexidine, draped in the usual sterile fashion using maximum barrier technique (cap and mask, sterile gown, sterile gloves, large sterile sheet, hand hygiene and cutaneous antiseptic). Local anesthesia was attained by infiltration with 1% lidocaine. Ultrasound demonstrated patency of the left basilic vein, and this was documented with an image. Under real-time ultrasound guidance, this vein was accessed with a 21 gauge micropuncture needle and image documentation was performed. The needle was exchanged over a guidewire for a peel-away sheath through which a 39 cm 5 [place] single lumen power injectable PICC was advanced, and positioned with its tip at the lower SVC/right atrial junction. Fluoroscopy during the procedure and fluoro spot radiograph confirms appropriate catheter position. The catheter was flushed, secured to the skin with a Stat Lock device, and covered with a sterile dressing. IMPRESSION: Successful placement of a left arm PICC with sonographic and fluoroscopic guidance. The catheter is ready for use. Electronically Signed   By: Markus Daft M.D.   On: 12/04/2018 17:30    Medications: I have reviewed the patient's current medications.   Assessment/Plan: 1. CLL-diagnosed in August 2010, flow cytometry consistent with CLL Enlarged leftinguinal lymph node January 2019,smallneck/axillary nodes and palpable splenomegaly 09/11/2017  CTson 09/17/2017-3 cm necrotic appearing lymph node in  the left inguinal region, borderline enlarged pelvic/retroperitoneal, chest, and axillary nodes. Mild spinal megaly.  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) deletion, no evidence of 11q22deletion  Bone marrow biopsy 02/26/2018-hypercellular marrow with extensive involvement by CLL, lymphocytes represent85% of all cells  Ibrutinib initiated 04/03/2018  Ibrutinib placed on hold 04/11/2018 due to onset of arthralgias  Ibrutinib resumed 04/16/2018, discontinued 04/25/2018 secondary to severe arthralgias/arthritis  Ibrutinib resumed at a dose of 140 mg daily 05/03/2018  Ibrutinib dose adjusted to 140 mg alternating with 246m 06/25/2018  Ibrutinib discontinued 07/03/2018 secondary to severe arthralgias  Acalabrutinib 08/16/2018, discontinued 11/15/2018 secondary to persistent severe transfusion dependent anemia and neutropenia/thrombocytopenia, last dose 11/14/2018  Bone marrow biopsy 11/21/2018- decreased cellularity, involvement by CLL, decreased erythroid and granulocytic precursors, decreased megakaryocytes 2.Hypothyroidism 3.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 Augmentin prescribed. 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  Pancytopenia secondary to CLL and a hypocellular bone marrow  G-CSF and  Nplate started 51/15/5208   Disposition: Meagan Walsh unchanged.  She underwent placement of a PICC yesterday.  The plan is to place a Port-A-Cath if the thrombocytopenia improves. She has persistent severe pancytopenia.  She knows to call for a fever or bleeding.  The plan is to begin Nplate and G-CSF support today.  She will begin rituximab tomorrow [facility]  We reviewed potential toxicities associated with rituximab including the chance of allergic reaction, reactivation of hepatitis, infections, hematologic toxicity, CNS toxicity, and pneumonitis.  She agrees to proceed.  She is not aware of a history of hepatitis in the past.  We will check records from her primary physician.  Hepatitis B surface antigen and core antibody studies returned negative on 12/02/2018.  On  She will return on Monday, Wednesday, and Friday for catheter care and labs.  She will receive Nplate weekly.  We will give G-CSF 3 days/week.  She requests a muscle relaxant.  We will prescribe Flexeril.  I cautioned her against using Ambien and Xanax while taking Flexeril.  Meagan Walsh proceed to get u[place]aa packed red blood cells today.  We have requested crossmatched platelets for future platelet transfusions.  GBetsy Coder MD  12/05/2018  1:30 PM

## 2018-12-05 NOTE — Telephone Encounter (Signed)
Scheduled appt per 5/14 los.  Patient aware of all her scheduled appt dates and time.

## 2018-12-06 ENCOUNTER — Inpatient Hospital Stay: Payer: BC Managed Care – PPO

## 2018-12-06 ENCOUNTER — Other Ambulatory Visit: Payer: Self-pay

## 2018-12-06 ENCOUNTER — Other Ambulatory Visit: Payer: Self-pay | Admitting: *Deleted

## 2018-12-06 ENCOUNTER — Telehealth: Payer: Self-pay | Admitting: *Deleted

## 2018-12-06 ENCOUNTER — Ambulatory Visit: Payer: BLUE CROSS/BLUE SHIELD

## 2018-12-06 VITALS — BP 109/59 | HR 106 | Temp 98.7°F | Resp 16

## 2018-12-06 DIAGNOSIS — D696 Thrombocytopenia, unspecified: Secondary | ICD-10-CM

## 2018-12-06 DIAGNOSIS — C9110 Chronic lymphocytic leukemia of B-cell type not having achieved remission: Secondary | ICD-10-CM

## 2018-12-06 DIAGNOSIS — E039 Hypothyroidism, unspecified: Secondary | ICD-10-CM | POA: Diagnosis not present

## 2018-12-06 DIAGNOSIS — Z8781 Personal history of (healed) traumatic fracture: Secondary | ICD-10-CM | POA: Diagnosis not present

## 2018-12-06 DIAGNOSIS — Z79899 Other long term (current) drug therapy: Secondary | ICD-10-CM | POA: Diagnosis not present

## 2018-12-06 DIAGNOSIS — Z9221 Personal history of antineoplastic chemotherapy: Secondary | ICD-10-CM | POA: Diagnosis not present

## 2018-12-06 DIAGNOSIS — R0602 Shortness of breath: Secondary | ICD-10-CM | POA: Diagnosis not present

## 2018-12-06 DIAGNOSIS — D61818 Other pancytopenia: Secondary | ICD-10-CM | POA: Diagnosis not present

## 2018-12-06 DIAGNOSIS — D801 Nonfamilial hypogammaglobulinemia: Secondary | ICD-10-CM | POA: Diagnosis not present

## 2018-12-06 LAB — TYPE AND SCREEN
ABO/RH(D): O POS
Antibody Screen: NEGATIVE
Unit division: 0

## 2018-12-06 LAB — SAMPLE TO BLOOD BANK

## 2018-12-06 LAB — BPAM RBC
Blood Product Expiration Date: 202006102359
ISSUE DATE / TIME: 202005141119
Unit Type and Rh: 5100

## 2018-12-06 LAB — CBC WITH DIFFERENTIAL (CANCER CENTER ONLY)
Abs Immature Granulocytes: 0 10*3/uL (ref 0.00–0.07)
Band Neutrophils: 2 %
Basophils Absolute: 0 10*3/uL (ref 0.0–0.1)
Basophils Relative: 0 %
Eosinophils Absolute: 0 10*3/uL (ref 0.0–0.5)
Eosinophils Relative: 0 %
HCT: 26.7 % — ABNORMAL LOW (ref 36.0–46.0)
Hemoglobin: 8.9 g/dL — ABNORMAL LOW (ref 12.0–15.0)
Lymphocytes Relative: 88 %
Lymphs Abs: 4.8 10*3/uL — ABNORMAL HIGH (ref 0.7–4.0)
MCH: 28.9 pg (ref 26.0–34.0)
MCHC: 33.3 g/dL (ref 30.0–36.0)
MCV: 86.7 fL (ref 80.0–100.0)
Monocytes Absolute: 0 10*3/uL — ABNORMAL LOW (ref 0.1–1.0)
Monocytes Relative: 0 %
Neutro Abs: 0.7 10*3/uL — ABNORMAL LOW (ref 1.7–17.7)
Neutrophils Relative %: 10 %
Platelet Count: 28 10*3/uL — ABNORMAL LOW (ref 150–400)
RBC: 3.08 MIL/uL — ABNORMAL LOW (ref 3.87–5.11)
RDW: 12.3 % (ref 11.5–15.5)
WBC Count: 5.5 10*3/uL (ref 4.0–10.5)
nRBC: 0 % (ref 0.0–0.2)

## 2018-12-06 LAB — BPAM PLATELET PHERESIS
Blood Product Expiration Date: 202005142359
ISSUE DATE / TIME: 202005141323
Unit Type and Rh: 9500

## 2018-12-06 LAB — PREPARE PLATELET PHERESIS: Unit division: 0

## 2018-12-06 MED ORDER — SODIUM CHLORIDE 0.9 % IV SOLN
Freq: Once | INTRAVENOUS | Status: AC
  Administered 2018-12-06: 08:00:00 via INTRAVENOUS
  Filled 2018-12-06: qty 250

## 2018-12-06 MED ORDER — TRAMADOL HCL 50 MG PO TABS
ORAL_TABLET | ORAL | Status: AC
  Filled 2018-12-06: qty 1

## 2018-12-06 MED ORDER — ACETAMINOPHEN 325 MG PO TABS
ORAL_TABLET | ORAL | Status: AC
  Filled 2018-12-06: qty 2

## 2018-12-06 MED ORDER — SODIUM CHLORIDE 0.9% FLUSH
10.0000 mL | INTRAVENOUS | Status: DC | PRN
  Administered 2018-12-06: 10 mL
  Filled 2018-12-06: qty 10

## 2018-12-06 MED ORDER — ACETAMINOPHEN 325 MG PO TABS
ORAL_TABLET | ORAL | Status: AC
  Filled 2018-12-06: qty 1

## 2018-12-06 MED ORDER — DIPHENHYDRAMINE HCL 25 MG PO CAPS
ORAL_CAPSULE | ORAL | Status: AC
  Filled 2018-12-06: qty 2

## 2018-12-06 MED ORDER — HEPARIN SOD (PORK) LOCK FLUSH 100 UNIT/ML IV SOLN
250.0000 [IU] | Freq: Once | INTRAVENOUS | Status: AC | PRN
  Administered 2018-12-06: 250 [IU]
  Filled 2018-12-06: qty 5

## 2018-12-06 MED ORDER — ACETAMINOPHEN 325 MG PO TABS
650.0000 mg | ORAL_TABLET | Freq: Once | ORAL | Status: AC
  Administered 2018-12-06: 650 mg via ORAL

## 2018-12-06 MED ORDER — ROMIPLOSTIM 250 MCG ~~LOC~~ SOLR: 1.0000 ug/kg | Freq: Once | SUBCUTANEOUS | Status: DC

## 2018-12-06 MED ORDER — SODIUM CHLORIDE 0.9 % IV SOLN
375.0000 mg/m2 | Freq: Once | INTRAVENOUS | Status: AC
  Administered 2018-12-06: 600 mg via INTRAVENOUS
  Filled 2018-12-06: qty 30

## 2018-12-06 MED ORDER — TRAMADOL HCL 50 MG PO TABS
50.0000 mg | ORAL_TABLET | Freq: Once | ORAL | Status: AC
  Administered 2018-12-06: 50 mg via ORAL

## 2018-12-06 MED ORDER — TBO-FILGRASTIM 300 MCG/0.5ML ~~LOC~~ SOSY
PREFILLED_SYRINGE | SUBCUTANEOUS | Status: AC
  Filled 2018-12-06: qty 0.5

## 2018-12-06 MED ORDER — DIPHENHYDRAMINE HCL 25 MG PO CAPS
50.0000 mg | ORAL_CAPSULE | Freq: Once | ORAL | Status: AC
  Administered 2018-12-06: 50 mg via ORAL

## 2018-12-06 MED ORDER — TBO-FILGRASTIM 300 MCG/0.5ML ~~LOC~~ SOSY
300.0000 ug | PREFILLED_SYRINGE | Freq: Once | SUBCUTANEOUS | Status: AC
  Administered 2018-12-06: 300 ug via SUBCUTANEOUS

## 2018-12-06 NOTE — Telephone Encounter (Signed)
Error

## 2018-12-06 NOTE — Progress Notes (Signed)
Notified blood bank that sample will be coming over today for the crossmatch platelets needed on 12/09/18. Order for crossmatched platelets entered under "comments" of the prepare order.

## 2018-12-06 NOTE — Patient Instructions (Signed)
[facility] Discharge Instructions for Patients Receiving Chemotherapy  Today you received the following chemotherapy agents Rituxan  To help prevent nausea and vomiting after your treatment, we encourage you to take your nausea medication as prescribed.    If you develop nausea and vomiting that is not controlled by your nausea medication, call the clinic.   BELOW ARE SYMPTOMS THAT SHOULD BE REPORTED IMMEDIATELY:  *FEVER GREATER THAN 100.5 F  *CHILLS WITH OR WITHOUT FEVER  NAUSEA AND VOMITING THAT IS NOT CONTROLLED WITH YOUR NAUSEA MEDICATION  *UNUSUAL SHORTNESS OF BREATH  *UNUSUAL BRUISING OR BLEEDING  TENDERNESS IN MOUTH AND THROAT WITH OR WITHOUT PRESENCE OF ULCERS  *URINARY PROBLEMS  *BOWEL PROBLEMS  UNUSUAL RASH Items with * indicate a potential emergency and should be followed up as soon as possible.  Feel free to call the clinic should you have any questions or concerns. The clinic phone number is [phone].  Please show the [facility] at check-in to the Emergency Department and triage nurse.  Rituximab injection What is this medicine? RITUXIMAB (ri TUX i mab) is a monoclonal antibody. It is used to treat certain types of cancer like non-Hodgkin lymphoma and chronic lymphocytic leukemia. It is also used to treat rheumatoid arthritis, granulomatosis with polyangiitis (or Wegener's granulomatosis), microscopic polyangiitis, and pemphigus vulgaris. This medicine may be used for other purposes; ask your health care provider or pharmacist if you have questions. COMMON BRAND NAME(S): Rituxan What should I tell my health care provider before I take this medicine? They need to know if you have any of these conditions: -heart disease -infection (especially a virus infection such as hepatitis B, chickenpox, cold sores, or herpes) -immune system problems -irregular heartbeat -kidney disease -low blood counts, like low white cell, platelet, or red  cell counts -lung or breathing disease, like asthma -recently received or scheduled to receive a vaccine -an unusual or allergic reaction to rituximab, other medicines, foods, dyes, or preservatives -pregnant or trying to get pregnant -breast-feeding How should I use this medicine? This medicine is for infusion into a vein. It is administered in a hospital or clinic by a specially trained health care professional. A special MedGuide will be given to you by the pharmacist with each prescription and refill. Be sure to read this information carefully each time. Talk to your pediatrician regarding the use of this medicine in children. This medicine is not approved for use in children. Overdosage: If you think you have taken too much of this medicine contact a poison control center or emergency room at once. NOTE: This medicine is only for you. Do not share this medicine with others. What if I miss a dose? It is important not to miss a dose. Call your doctor or health care professional if you are unable to keep an appointment. What may interact with this medicine? -cisplatin -live virus vaccines This list may not describe all possible interactions. Give your health care provider a list of all the medicines, herbs, non-prescription drugs, or dietary supplements you use. Also tell them if you smoke, drink alcohol, or use illegal drugs. Some items may interact with your medicine. What should I watch for while using this medicine? Your condition will be monitored carefully while you are receiving this medicine. You may need blood work done while you are taking this medicine. This medicine can cause serious allergic reactions. To reduce your risk you may need to take medicine before treatment with this medicine. Take your medicine as  directed. In some patients, this medicine may cause a serious brain infection that may cause death. If you have any problems seeing, thinking, speaking, walking, or standing,  tell your healthcare professional right away. If you cannot reach your healthcare professional, urgently seek other source of medical care. Call your doctor or health care professional for advice if you get a fever, chills or sore throat, or other symptoms of a cold or flu. Do not treat yourself. This drug decreases your body's ability to fight infections. Try to avoid being around people who are sick. Do not become pregnant while taking this medicine or for 12 months after stopping it. Women should inform their doctor if they wish to become pregnant or think they might be pregnant. There is a potential for serious side effects to an unborn child. Talk to your health care professional or pharmacist for more information. Do not breast-feed an infant while taking this medicine or for 6 months after stopping it. What side effects may I notice from receiving this medicine? Side effects that you should report to your doctor or health care professional as soon as possible: -allergic reactions like skin rash, itching or hives; swelling of the face, lips, or tongue -breathing problems -chest pain -changes in vision -diarrhea -headache with fever, neck stiffness, sensitivity to light, nausea, or confusion -fast, irregular heartbeat -loss of memory -low blood counts - this medicine may decrease the number of white blood cells, red blood cells and platelets. You may be at increased risk for infections and bleeding. -mouth sores -problems with balance, talking, or walking -redness, blistering, peeling or loosening of the skin, including inside the mouth -signs of infection - fever or chills, cough, sore throat, pain or difficulty passing urine -signs and symptoms of kidney injury like trouble passing urine or change in the amount of urine -signs and symptoms of liver injury like dark yellow or brown urine; general ill feeling or flu-like symptoms; light-colored stools; loss of appetite; nausea; right upper  belly pain; unusually weak or tired; yellowing of the eyes or skin -signs and symptoms of low blood pressure like dizziness; feeling faint or lightheaded, falls; unusually weak or tired -stomach pain -swelling of the ankles, feet, hands -unusual bleeding or bruising -vomiting Side effects that usually do not require medical attention (report to your doctor or health care professional if they continue or are bothersome): -headache -joint pain -muscle cramps or muscle pain -nausea -tiredness This list may not describe all possible side effects. Call your doctor for medical advice about side effects. You may report side effects to FDA at 1-800-FDA-1088. Where should I keep my medicine? This drug is given in a hospital or clinic and will not be stored at home. NOTE: This sheet is a summary. It may not cover all possible information. If you have questions about this medicine, talk to your doctor, pharmacist, or health care provider.  2019 [employer]/Gold Standard (2017-06-22 13:04:32)

## 2018-12-08 ENCOUNTER — Other Ambulatory Visit: Payer: Self-pay | Admitting: Oncology

## 2018-12-09 ENCOUNTER — Inpatient Hospital Stay: Payer: BC Managed Care – PPO

## 2018-12-09 ENCOUNTER — Telehealth: Payer: Self-pay | Admitting: *Deleted

## 2018-12-09 ENCOUNTER — Other Ambulatory Visit: Payer: Self-pay

## 2018-12-09 ENCOUNTER — Other Ambulatory Visit: Payer: BLUE CROSS/BLUE SHIELD

## 2018-12-09 ENCOUNTER — Encounter: Payer: Self-pay | Admitting: Psychiatry

## 2018-12-09 ENCOUNTER — Telehealth: Payer: Self-pay | Admitting: Psychiatry

## 2018-12-09 ENCOUNTER — Other Ambulatory Visit: Payer: Self-pay | Admitting: Oncology

## 2018-12-09 ENCOUNTER — Ambulatory Visit: Payer: BLUE CROSS/BLUE SHIELD

## 2018-12-09 ENCOUNTER — Ambulatory Visit (INDEPENDENT_AMBULATORY_CARE_PROVIDER_SITE_OTHER): Payer: BLUE CROSS/BLUE SHIELD | Admitting: Psychiatry

## 2018-12-09 VITALS — BP 108/69 | HR 81 | Temp 97.9°F | Resp 16

## 2018-12-09 DIAGNOSIS — D696 Thrombocytopenia, unspecified: Secondary | ICD-10-CM

## 2018-12-09 DIAGNOSIS — D61818 Other pancytopenia: Secondary | ICD-10-CM | POA: Diagnosis not present

## 2018-12-09 DIAGNOSIS — C9110 Chronic lymphocytic leukemia of B-cell type not having achieved remission: Secondary | ICD-10-CM

## 2018-12-09 DIAGNOSIS — F4310 Post-traumatic stress disorder, unspecified: Secondary | ICD-10-CM | POA: Diagnosis not present

## 2018-12-09 DIAGNOSIS — R0602 Shortness of breath: Secondary | ICD-10-CM | POA: Diagnosis not present

## 2018-12-09 DIAGNOSIS — D801 Nonfamilial hypogammaglobulinemia: Secondary | ICD-10-CM | POA: Diagnosis not present

## 2018-12-09 DIAGNOSIS — E039 Hypothyroidism, unspecified: Secondary | ICD-10-CM | POA: Diagnosis not present

## 2018-12-09 DIAGNOSIS — F331 Major depressive disorder, recurrent, moderate: Secondary | ICD-10-CM

## 2018-12-09 DIAGNOSIS — Z9221 Personal history of antineoplastic chemotherapy: Secondary | ICD-10-CM | POA: Diagnosis not present

## 2018-12-09 DIAGNOSIS — Z79899 Other long term (current) drug therapy: Secondary | ICD-10-CM | POA: Diagnosis not present

## 2018-12-09 DIAGNOSIS — Z8781 Personal history of (healed) traumatic fracture: Secondary | ICD-10-CM | POA: Diagnosis not present

## 2018-12-09 LAB — CBC WITH DIFFERENTIAL (CANCER CENTER ONLY)
Abs Immature Granulocytes: 0 10*3/uL (ref 0.00–0.07)
Basophils Absolute: 0 10*3/uL (ref 0.0–0.1)
Basophils Relative: 0 %
Eosinophils Absolute: 0 10*3/uL (ref 0.0–0.5)
Eosinophils Relative: 0 %
HCT: 25.7 % — ABNORMAL LOW (ref 36.0–46.0)
Hemoglobin: 8.7 g/dL — ABNORMAL LOW (ref 12.0–15.0)
Immature Granulocytes: 0 %
Lymphocytes Relative: 86 %
Lymphs Abs: 2 10*3/uL (ref 0.7–4.0)
MCH: 28.8 pg (ref 26.0–34.0)
MCHC: 33.9 g/dL (ref 30.0–36.0)
MCV: 85.1 fL (ref 80.0–100.0)
Monocytes Absolute: 0.1 10*3/uL (ref 0.1–1.0)
Monocytes Relative: 3 %
Neutro Abs: 0.3 10*3/uL — CL (ref 1.7–7.7)
Neutrophils Relative %: 11 %
Platelet Count: 10 10*3/uL — ABNORMAL LOW (ref 150–400)
RBC: 3.02 MIL/uL — ABNORMAL LOW (ref 3.87–5.11)
RDW: 12.1 % (ref 11.5–15.5)
WBC Count: 2.3 10*3/uL — ABNORMAL LOW (ref 4.0–10.5)
nRBC: 0 % (ref 0.0–0.2)

## 2018-12-09 LAB — SAMPLE TO BLOOD BANK

## 2018-12-09 MED ORDER — SODIUM CHLORIDE 0.9% FLUSH
10.0000 mL | INTRAVENOUS | Status: DC | PRN
  Administered 2018-12-09: 18:00:00 10 mL
  Filled 2018-12-09: qty 10

## 2018-12-09 MED ORDER — TBO-FILGRASTIM 300 MCG/0.5ML ~~LOC~~ SOSY
PREFILLED_SYRINGE | SUBCUTANEOUS | Status: AC
  Filled 2018-12-09: qty 0.5

## 2018-12-09 MED ORDER — ACETAMINOPHEN 325 MG PO TABS: 650.0000 mg | ORAL_TABLET | Freq: Once | ORAL | Status: DC

## 2018-12-09 MED ORDER — HEPARIN SOD (PORK) LOCK FLUSH 100 UNIT/ML IV SOLN
500.0000 [IU] | Freq: Once | INTRAVENOUS | Status: AC | PRN
  Administered 2018-12-09: 500 [IU]
  Filled 2018-12-09: qty 5

## 2018-12-09 MED ORDER — SODIUM CHLORIDE 0.9% FLUSH
10.0000 mL | INTRAVENOUS | Status: DC | PRN
  Administered 2018-12-09: 10 mL
  Filled 2018-12-09: qty 10

## 2018-12-09 MED ORDER — TBO-FILGRASTIM 300 MCG/0.5ML ~~LOC~~ SOSY
300.0000 ug | PREFILLED_SYRINGE | Freq: Once | SUBCUTANEOUS | Status: AC
  Administered 2018-12-09: 300 ug via SUBCUTANEOUS

## 2018-12-09 MED ORDER — SODIUM CHLORIDE 0.9% IV SOLUTION
250.0000 mL | Freq: Once | INTRAVENOUS | Status: AC
  Administered 2018-12-09: 250 mL via INTRAVENOUS
  Filled 2018-12-09: qty 250

## 2018-12-09 MED ORDER — DIPHENHYDRAMINE HCL 25 MG PO CAPS: 25.0000 mg | ORAL_CAPSULE | Freq: Once | ORAL | Status: DC

## 2018-12-09 NOTE — Telephone Encounter (Signed)
Notified of ANC 74f 0.3 today per the lab. Merceda Elks, RN with Dr. Benay Spice made aware of this.

## 2018-12-09 NOTE — Progress Notes (Addendum)
Meagan Walsh [id number] Mar 28, 1961 58 y.o.   Virtual Visit via Telephone Note  I connected with pt by telephone and verified that I am speaking with the correct person using two identifiers.   I discussed the limitations, risks, security and privacy concerns of performing an evaluation and management service by telephone and the availability of in person appointments. I also discussed with the patient that there may be a patient responsible charge related to this service. The patient expressed understanding and agreed to proceed.  I discussed the assessment and treatment plan with the patient. The patient was provided an opportunity to ask questions and all were answered. The patient agreed with the plan and demonstrated an understanding of the instructions.   The patient was advised to call back or seek an in-person evaluation if the symptoms worsen or if the condition fails to improve as anticipated.  I provided 30 minutes of non-face-to-face time during this encounter. The call started at 2:00 and ended at 230. The patient was located at home and the provider was located office.   Subjective:   Patient ID:  Meagan Walsh is a 58 y.o. (DOB Dec 17, 1960) female.  Chief Complaint:  Chief Complaint  Patient presents with  . Follow-up    Medication management  . Anxiety    Medication management    Anxiety  Patient reports no confusion, decreased concentration, nervous/anxious behavior or suicidal ideas.     Meagan Walsh [place] presents to the office today for follow-up of bipolar depression.  Last seen in January.  Held up really well.  Patient reports stable mood and denies depressed or irritable moods.  Patient denies any recent difficulty with anxiety.  Patient denies difficulty with sleep initiation or maintenance. Denies appetite disturbance.  Patient reports that energy and motivation have been good.  Patient denies any difficulty with concentration.  Patient denies any  suicidal ideation.  Felt good even with the cancer which is a big stressor.   Has had leukemia CLL and transfusions.  Started chemo with SE and can't tolerate bc low platelets.  Getting transfusions.  Onc [name].  Closely monitored.  Referral to [facility] soon.  In spite of stress has not been depressed.  Patient reports stable mood and denies depressed or irritable moods.  Patient denies any recent difficulty with anxiety.  Patient denies difficulty with sleep initiation or maintenance with Ambien. Denies appetite disturbance.  No weight loss.  Patient reports that energy and motivation have been good.  Patient denies any difficulty with concentration.  Patient denies any suicidal ideation.  Lost 25# and regained 4#.  2 severe episodes depression in life that last a full year.  Multiple failed prior medication trials include but are not limited to the following: Viibryd, Abilify, Vraylar, Rexulti 0.5 mg caused restlessness, duloxetine, Viibryd, Trintellix 20 mg failed until lithium was added  Review of Systems:  Review of Systems  Neurological: Negative for tremors and weakness.  Psychiatric/Behavioral: Negative for agitation, behavioral problems, confusion, decreased concentration, dysphoric mood, hallucinations, self-injury, sleep disturbance and suicidal ideas. The patient is not nervous/anxious and is not hyperactive.     Medications: I have reviewed the patient's current medications.  Current Outpatient Medications  Medication Sig Dispense Refill  . acetaminophen (TYLENOL) 325 MG tablet Take 650 mg by mouth every 6 (six) hours as needed.    . ALPRAZolam (XANAX) 0.25 MG tablet TAKE 1 TO 2 TABLETS BY MOUTH EVERY DAY AS NEEDED FOR ANXIETY 30 tablet 1  . Ibuprofen  200 MG CAPS Take 400 mg by mouth as needed.    . lamoTRIgine (LAMICTAL) 200 MG tablet TAKE 1 TABLET BY MOUTH TWICE A DAY 180 tablet 1  . levonorgestrel (MIRENA) 20 MCG/24HR IUD 1 each by Intrauterine route once.     [name]  levothyroxine (SYNTHROID, LEVOTHROID) 100 MCG tablet Take 100 mcg by mouth daily.    [name] lithium carbonate (LITHOBID) 300 MG CR tablet Take 2 tablets (600 mg total) by mouth at bedtime. 180 tablet 0  . traMADol (ULTRAM) 50 MG tablet TAKE 1 TABLET BY MOUTH EVERY 8 HOURS AS NEEDED 20 tablet 0  . vortioxetine HBr (TRINTELLIX) 20 MG TABS tablet Take 1 tablet (20 mg total) by mouth daily. 90 tablet 0  . zolpidem (AMBIEN) 10 MG tablet TAKE 1/2 TO 1 TABLET BY MOUTH AS NEEDED FOR INSOMNIA. 30 tablet 5   No current facility-administered medications for this visit.     Medication Side Effects: None  Allergies: No Known Allergies  Past Medical History:  Diagnosis Date  . Anxiety   . Cancer ([place])    CLL  . Degenerative disk disease    Neck--chronic pain  . Depression   . Fibroids    Uterine  . 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 Hypothyroidism   . Lymphocytosis   . Mitral valve prolapse   . Mitral valve prolapse   . Thyroid disease    Hypothyroid    Family History  Problem Relation Age of Onset  . Breast cancer Mother     Social History   Socioeconomic History  . Marital status: Married    Spouse name: Not on file  . Number of children: Not on file  . Years of education: Not on file  . Highest education level: Not on file  Occupational History  . Not on file  Social Needs  . Financial resource strain: Not on file  . Food insecurity:    Worry: Not on file    Inability: Not on file  . Transportation needs:    Medical: Not on file    Non-medical: Not on file  Tobacco Use  . Smoking status: Never Smoker  . Smokeless tobacco: Never Used  Substance and Sexual Activity  . Alcohol use: Yes    Frequency: Never    Comment: 2 glasses of wine 2 times per week   . Drug use: No  . Sexual activity: Not on file  Lifestyle  . Physical activity:    Days per week: Not on file    Minutes per session: Not on file  . Stress: Not on file   Relationships  . Social connections:    Talks on phone: Not on file    Gets together: Not on file    Attends religious service: Not on file    Active member of club or organization: Not on file    Attends meetings of clubs or organizations: Not on file    Relationship status: Not on file  . Intimate partner violence:    Fear of current or ex partner: Not on file    Emotionally abused: Not on file    Physically abused: Not on file    Forced sexual activity: Not on file  Other Topics Concern  . Not on file  Social History Narrative   Pt lives in 2 story home with her husband   Has 3 adult children   Masters degree   Works at [employer]    Past Medical History, Surgical history, Social history, and Family history were reviewed and updated as appropriate.   Please see review of systems for further details on the patient's review from today.   Objective:   Physical Exam:  There were no vitals taken for this visit.  Physical Exam Constitutional:      General: She is not in acute distress.    Appearance: She is well-developed.  Musculoskeletal:        General: No deformity.  Neurological:     Mental Status: She is alert and oriented to person, place, and time.     Motor: No tremor.     Coordination: Coordination normal.     Gait: Gait normal.  Psychiatric:        Attention and Perception: Attention and perception normal.        Mood and Affect: Mood is not anxious or depressed. Affect is not labile, blunt, angry or inappropriate.        Speech: Speech normal.        Behavior: Behavior normal.        Thought Content: Thought content normal. Thought content does not include homicidal or suicidal ideation. Thought content does not include homicidal or suicidal plan.        Cognition and Memory: Cognition normal.        Judgment: Judgment normal.     Comments: Insight intact. No auditory or visual hallucinations. No delusions.      Lab Review:     Component Value  Date/Time   NA 142 12/02/2018 1021   K 4.2 12/02/2018 1021   CL 106 12/02/2018 1021   CO2 28 12/02/2018 1021   GLUCOSE 112 (H) 12/02/2018 1021   BUN 19 12/02/2018 1021   CREATININE 0.94 12/02/2018 1021   CALCIUM 9.8 12/02/2018 1021   PROT 6.4 (L) 12/02/2018 1021   ALBUMIN 4.3 12/02/2018 1021   AST 18 12/02/2018 1021   ALT 29 12/02/2018 1021   ALKPHOS 89 12/02/2018 1021   BILITOT 0.5 12/02/2018 1021   GFRNONAA >60 12/02/2018 1021   GFRAA >60 12/02/2018 1021       Component Value Date/Time   WBC 2.3 (L) 12/09/2018 0953   WBC 2.8 (L) 12/04/2018 1455   RBC 3.02 (L) 12/09/2018 0953   HGB 8.7 (L) 12/09/2018 0953   HGB 14.0 06/27/2016 1038   HCT 25.7 (L) 12/09/2018 0953   HCT 42.0 06/27/2016 1038   PLT 10 (L) 12/09/2018 0953   PLT 205 06/27/2016 1038   MCV 85.1 12/09/2018 0953   MCV 92.9 06/27/2016 1038   MCH 28.8 12/09/2018 0953   MCHC 33.9 12/09/2018 0953   RDW 12.1 12/09/2018 0953   RDW 12.2 06/27/2016 1038   LYMPHSABS 2.0 12/09/2018 0953   LYMPHSABS 21.5 (H) 06/27/2016 1038   MONOABS 0.1 12/09/2018 0953   MONOABS 0.5 06/27/2016 1038   EOSABS 0.0 12/09/2018 0953   EOSABS 0.2 06/27/2016 1038   BASOSABS 0.0 12/09/2018 0953   BASOSABS 0.1 06/27/2016 1038    Lithium Lvl  Date Value Ref Range Status  11/20/2018 0.68 0.60 - 1.20 mmol/L Final    Comment:    Performed at [facility], [place] [address] [place], [place] [address]     No results found for: PHENYTOIN, PHENOBARB, VALPROATE, CBMZ   .res Assessment: Plan:    Major depressive disorder, recurrent episode, moderate (HCC)  PTSD (post-traumatic stress disorder)   Sumayyah has had history of 2 very severe  prolonged depressions that failed to respond to multiple psychiatric medications but then responded to lithium potentiation.  She has been through the past 2 winters with no difficulty.  She has a very strong seasonal pattern.  She wonders about possibly coming off the lithium because the last  2 winters have been unremarkable.  I recommend that she go through 1 more winter at least on the lithium before considering reduction.  Lithium level is good at 0.68.  Rec against going down.  Because of the stress of the cancer.  She might possibly do well with even less lithium but it seems risky to take that chance of reducing lithium in the context of so much stress from the cancer.  She agrees  Counseled patient regarding potential benefits, risks, and side effects of lithium to include potential risk of lithium affecting thyroid and renal function.  Discussed need for periodic lab monitoring to determine drug level and to assess for potential adverse effects.  Counseled patient regarding signs and symptoms of lithium toxicity and advised that they notify office immediately or seek urgent medical attention if experiencing these signs and symptoms.  Patient advised to contact office with any questions or concerns.  FU 4 mos.  Call prn.   Lynder Parents, MD, DFAPA   Please see After Visit Summary for patient specific instructions.  Future Appointments  Date Time Provider [place]  12/09/2018  3:00 PM [facility] INFUSION [facility] None  12/11/2018  9:15 AM [facility] ONLY [facility] None  12/11/2018  9:30 AM [facility] [place] FLUSH [facility] None  12/11/2018 10:00 AM [facility] [place] FLUSH [facility] None  12/13/2018  8:30 AM [facility] ONLY [facility] None  12/13/2018  8:45 AM [facility] [place] FLUSH [facility] None  12/13/2018  9:15 AM Ladell Pier, MD [facility] None  12/13/2018 10:00 AM [facility] INFUSION [facility] None    No orders of the defined types were placed in this encounter.     -------------------------------

## 2018-12-09 NOTE — Telephone Encounter (Signed)
TCT patient. No answer.  Left vm message for pt to call back @ [phone]  if she has any questions or concerns regarding her 1st treatment of Rituxan

## 2018-12-09 NOTE — Patient Instructions (Signed)

## 2018-12-09 NOTE — Telephone Encounter (Signed)
Patient called and said that she just got off the phone with you and would like you to call her back when you have time. [phone]

## 2018-12-09 NOTE — Telephone Encounter (Signed)
-----   Message from Thomasene Lot, RN sent at 12/06/2018 12:39 PM EDT ----- Regarding: Dr Benay Spice 1st tx f/u call 1st tx f/u call

## 2018-12-10 ENCOUNTER — Other Ambulatory Visit: Payer: Self-pay | Admitting: *Deleted

## 2018-12-10 DIAGNOSIS — C9110 Chronic lymphocytic leukemia of B-cell type not having achieved remission: Secondary | ICD-10-CM

## 2018-12-10 DIAGNOSIS — D696 Thrombocytopenia, unspecified: Secondary | ICD-10-CM

## 2018-12-10 LAB — PREPARE PLATELET PHERESIS: Unit division: 0

## 2018-12-10 LAB — BPAM PLATELET PHERESIS
Blood Product Expiration Date: 202005202359
ISSUE DATE / TIME: 202005181618
Unit Type and Rh: 5100

## 2018-12-10 NOTE — Progress Notes (Signed)
Per Dr. Benay Spice: Need to crossmatch platelets again on 12/11/18 in case needed on 12/13/18. Sample to blood bank ordered for 12/11/18 collection.

## 2018-12-11 ENCOUNTER — Other Ambulatory Visit: Payer: Self-pay

## 2018-12-11 ENCOUNTER — Inpatient Hospital Stay: Payer: BC Managed Care – PPO

## 2018-12-11 ENCOUNTER — Other Ambulatory Visit: Payer: Self-pay | Admitting: *Deleted

## 2018-12-11 VITALS — BP 121/66 | HR 79 | Temp 98.1°F | Resp 16

## 2018-12-11 DIAGNOSIS — C9110 Chronic lymphocytic leukemia of B-cell type not having achieved remission: Secondary | ICD-10-CM | POA: Diagnosis not present

## 2018-12-11 DIAGNOSIS — D696 Thrombocytopenia, unspecified: Secondary | ICD-10-CM

## 2018-12-11 DIAGNOSIS — Z9221 Personal history of antineoplastic chemotherapy: Secondary | ICD-10-CM | POA: Diagnosis not present

## 2018-12-11 DIAGNOSIS — D801 Nonfamilial hypogammaglobulinemia: Secondary | ICD-10-CM | POA: Diagnosis not present

## 2018-12-11 DIAGNOSIS — E039 Hypothyroidism, unspecified: Secondary | ICD-10-CM | POA: Diagnosis not present

## 2018-12-11 DIAGNOSIS — R0602 Shortness of breath: Secondary | ICD-10-CM | POA: Diagnosis not present

## 2018-12-11 DIAGNOSIS — D61818 Other pancytopenia: Secondary | ICD-10-CM | POA: Diagnosis not present

## 2018-12-11 DIAGNOSIS — Z79899 Other long term (current) drug therapy: Secondary | ICD-10-CM | POA: Diagnosis not present

## 2018-12-11 DIAGNOSIS — Z8781 Personal history of (healed) traumatic fracture: Secondary | ICD-10-CM | POA: Diagnosis not present

## 2018-12-11 LAB — CBC WITH DIFFERENTIAL (CANCER CENTER ONLY)
Abs Immature Granulocytes: 0.12 10*3/uL — ABNORMAL HIGH (ref 0.00–0.07)
Basophils Absolute: 0 10*3/uL (ref 0.0–0.1)
Basophils Relative: 0 %
Eosinophils Absolute: 0 10*3/uL (ref 0.0–0.5)
Eosinophils Relative: 0 %
HCT: 22.7 % — ABNORMAL LOW (ref 36.0–46.0)
Hemoglobin: 7.6 g/dL — ABNORMAL LOW (ref 12.0–15.0)
Immature Granulocytes: 5 %
Lymphocytes Relative: 81 %
Lymphs Abs: 2 10*3/uL (ref 0.7–4.0)
MCH: 28.6 pg (ref 26.0–34.0)
MCHC: 33.5 g/dL (ref 30.0–36.0)
MCV: 85.3 fL (ref 80.0–100.0)
Monocytes Absolute: 0.1 10*3/uL (ref 0.1–1.0)
Monocytes Relative: 4 %
Neutro Abs: 0.3 10*3/uL — CL (ref 1.7–7.7)
Neutrophils Relative %: 10 %
Platelet Count: 14 10*3/uL — ABNORMAL LOW (ref 150–400)
RBC: 2.66 MIL/uL — ABNORMAL LOW (ref 3.87–5.11)
RDW: 12.2 % (ref 11.5–15.5)
WBC Count: 2.4 10*3/uL — ABNORMAL LOW (ref 4.0–10.5)
nRBC: 0 % (ref 0.0–0.2)

## 2018-12-11 LAB — SAMPLE TO BLOOD BANK

## 2018-12-11 MED ORDER — ROMIPLOSTIM 250 MCG ~~LOC~~ SOLR
2.0000 ug/kg | Freq: Once | SUBCUTANEOUS | Status: AC
  Administered 2018-12-11: 120 ug via SUBCUTANEOUS
  Filled 2018-12-11: qty 0.24

## 2018-12-11 MED ORDER — TBO-FILGRASTIM 300 MCG/0.5ML ~~LOC~~ SOSY
300.0000 ug | PREFILLED_SYRINGE | Freq: Once | SUBCUTANEOUS | Status: AC
  Administered 2018-12-11: 300 ug via SUBCUTANEOUS

## 2018-12-11 MED ORDER — SODIUM CHLORIDE 0.9% FLUSH
10.0000 mL | INTRAVENOUS | Status: DC | PRN
  Administered 2018-12-11: 10 mL
  Filled 2018-12-11: qty 10

## 2018-12-11 MED ORDER — HEPARIN SOD (PORK) LOCK FLUSH 100 UNIT/ML IV SOLN
500.0000 [IU] | Freq: Once | INTRAVENOUS | Status: AC | PRN
  Administered 2018-12-11: 250 [IU]
  Filled 2018-12-11: qty 5

## 2018-12-11 MED ORDER — TBO-FILGRASTIM 300 MCG/0.5ML ~~LOC~~ SOSY
PREFILLED_SYRINGE | SUBCUTANEOUS | Status: AC
  Filled 2018-12-11: qty 0.5

## 2018-12-11 MED ORDER — DARBEPOETIN ALFA 300 MCG/0.6ML IJ SOSY
PREFILLED_SYRINGE | INTRAMUSCULAR | Status: AC
  Filled 2018-12-11: qty 0.6

## 2018-12-11 NOTE — Patient Instructions (Addendum)
Tbo-Filgrastim injection What is this medicine? TBO-FILGRASTIM (T B O fil GRA stim) is a granulocyte colony-stimulating factor that stimulates the growth of neutrophils, a type of white blood cell important in the body's fight against infection. It is used to reduce the incidence of fever and infection in patients with certain types of cancer who are receiving chemotherapy that affects the bone marrow. This medicine may be used for other purposes; ask your health care provider or pharmacist if you have questions. COMMON BRAND NAME(S): Granix What should I tell my health care provider before I take this medicine? They need to know if you have any of these conditions: -bone scan or tests planned -kidney disease -sickle cell anemia -an unusual or allergic reaction to tbo-filgrastim, filgrastim, pegfilgrastim, other medicines, foods, dyes, or preservatives -pregnant or trying to get pregnant -breast-feeding How should I use this medicine? This medicine is for injection under the skin. If you get this medicine at home, you will be taught how to prepare and give this medicine. Refer to the Instructions for Use that come with your medication packaging. Use exactly as directed. Take your medicine at regular intervals. Do not take your medicine more often than directed. It is important that you put your used needles and syringes in a special sharps container. Do not put them in a trash can. If you do not have a sharps container, call your pharmacist or healthcare provider to get one. Talk to your pediatrician regarding the use of this medicine in children. While this drug may be prescribed for children as young as 1 month of age for selected conditions, precautions do apply. Overdosage: If you think you have taken too much of this medicine contact a poison control center or emergency room at once. NOTE: This medicine is only for you. Do not share this medicine with others. What if I miss a dose? It is  important not to miss your dose. Call your doctor or health care professional if you miss a dose. What may interact with this medicine? This medicine may interact with the following medications: -medicines that may cause a release of neutrophils, such as lithium This list may not describe all possible interactions. Give your health care provider a list of all the medicines, herbs, non-prescription drugs, or dietary supplements you use. Also tell them if you smoke, drink alcohol, or use illegal drugs. Some items may interact with your medicine. What should I watch for while using this medicine? You may need blood work done while you are taking this medicine. What side effects may I notice from receiving this medicine? Side effects that you should report to your doctor or health care professional as soon as possible: -allergic reactions like skin rash, itching or hives, swelling of the face, lips, or tongue -back pain -blood in the urine -dark urine -dizziness -fast heartbeat -feeling faint -shortness of breath or breathing problems -signs and symptoms of infection like fever or chills; cough; or sore throat -signs and symptoms of kidney injury like trouble passing urine or change in the amount of urine -stomach or side pain, or pain at the shoulder -sweating -swelling of the legs, ankles, or abdomen -tiredness Side effects that usually do not require medical attention (report to your doctor or health care professional if they continue or are bothersome): -bone pain -diarrhea -headache -muscle pain -vomiting This list may not describe all possible side effects. Call your doctor for medical advice about side effects. You may report side effects to FDA at   1-800-FDA-1088. Where should I keep my medicine? Keep out of the reach of children. Store in a refrigerator between 2 and 8 degrees C (36 and 46 degrees F). Keep in carton to protect from light. Throw away this medicine if it is left out  of the refrigerator for more than 5 consecutive days. Throw away any unused medicine after the expiration date. NOTE: This sheet is a summary. It may not cover all possible information. If you have questions about this medicine, talk to your doctor, pharmacist, or health care provider.  2019 Elsevier/Gold Standard (2017-02-27 16:56:18) Romiplostim injection What is this medicine? ROMIPLOSTIM (roe mi PLOE stim) helps your body make more platelets. This medicine is used to treat low platelets caused by chronic idiopathic thrombocytopenic purpura (ITP). This medicine may be used for other purposes; ask your health care provider or pharmacist if you have questions. COMMON BRAND NAME(S): Nplate What should I tell my health care provider before I take this medicine? They need to know if you have any of these conditions: -bleeding disorders -bone marrow problem, like blood cancer or myelodysplastic syndrome -history of blood clots -liver disease -surgery to remove your spleen -an unusual or allergic reaction to romiplostim, mannitol, other medicines, foods, dyes, or preservatives -pregnant or trying to get pregnant -breast-feeding How should I use this medicine? This medicine is for injection under the skin. It is given by a health care professional in a hospital or clinic setting. A special MedGuide will be given to you before your injection. Read this information carefully each time. Talk to your pediatrician regarding the use of this medicine in children. While this drug may be prescribed for children as young as 1 year for selected conditions, precautions do apply. Overdosage: If you think you have taken too much of this medicine contact a poison control center or emergency room at once. NOTE: This medicine is only for you. Do not share this medicine with others. What if I miss a dose? It is important not to miss your dose. Call your doctor or health care professional if you are unable to keep  an appointment. What may interact with this medicine? Interactions are not expected. This list may not describe all possible interactions. Give your health care provider a list of all the medicines, herbs, non-prescription drugs, or dietary supplements you use. Also tell them if you smoke, drink alcohol, or use illegal drugs. Some items may interact with your medicine. What should I watch for while using this medicine? Your condition will be monitored carefully while you are receiving this medicine. Visit your prescriber or health care professional for regular checks on your progress and for the needed blood tests. It is important to keep all appointments. What side effects may I notice from receiving this medicine? Side effects that you should report to your doctor or health care professional as soon as possible: -allergic reactions like skin rash, itching or hives, swelling of the face, lips, or tongue -signs and symptoms of bleeding such as bloody or black, tarry stools; red or dark brown urine; spitting up blood or brown material that looks like coffee grounds; red spots on the skin; unusual bruising or bleeding from the eyes, gums, or nose -signs and symptoms of a blood clot such as chest pain; shortness of breath; pain, swelling, or warmth in the leg -signs and symptoms of a stroke like changes in vision; confusion; trouble speaking or understanding; severe headaches; sudden numbness or weakness of the face, arm or leg; trouble  walking; dizziness; loss of balance or coordination Side effects that usually do not require medical attention (report to your doctor or health care professional if they continue or are bothersome): -headache -pain in arms and legs -pain in mouth -stomach pain This list may not describe all possible side effects. Call your doctor for medical advice about side effects. You may report side effects to FDA at 1-800-FDA-1088. Where should I keep my medicine? This drug is  given in a hospital or clinic and will not be stored at home. NOTE: This sheet is a summary. It may not cover all possible information. If you have questions about this medicine, talk to your doctor, pharmacist, or health care provider.  2019 Elsevier/Gold Standard (2017-07-09 11:10:55)

## 2018-12-11 NOTE — Progress Notes (Signed)
Orders placed for crossmatched platelets on 12/13/18 per Dr. Benay Spice. Blood bank notified.

## 2018-12-12 NOTE — Progress Notes (Signed)
Contacted Kelly with Dr. Raul Del office and Dr. Loren Racer office to obtain any past Hepatitis B testing results done on patient prior to her Rituxan. Both offices report no Hep B testing was done with them. MD notified.

## 2018-12-13 ENCOUNTER — Inpatient Hospital Stay (HOSPITAL_BASED_OUTPATIENT_CLINIC_OR_DEPARTMENT_OTHER): Payer: BC Managed Care – PPO | Admitting: Oncology

## 2018-12-13 ENCOUNTER — Other Ambulatory Visit: Payer: Self-pay | Admitting: *Deleted

## 2018-12-13 ENCOUNTER — Inpatient Hospital Stay: Payer: BC Managed Care – PPO

## 2018-12-13 ENCOUNTER — Other Ambulatory Visit: Payer: Self-pay

## 2018-12-13 VITALS — BP 111/63 | HR 75 | Temp 98.2°F | Resp 17

## 2018-12-13 VITALS — BP 112/63 | HR 93 | Temp 97.0°F | Resp 18 | Ht 66.0 in | Wt 132.0 lb

## 2018-12-13 DIAGNOSIS — C9110 Chronic lymphocytic leukemia of B-cell type not having achieved remission: Secondary | ICD-10-CM

## 2018-12-13 DIAGNOSIS — Z79899 Other long term (current) drug therapy: Secondary | ICD-10-CM | POA: Diagnosis not present

## 2018-12-13 DIAGNOSIS — D696 Thrombocytopenia, unspecified: Secondary | ICD-10-CM

## 2018-12-13 DIAGNOSIS — Z8781 Personal history of (healed) traumatic fracture: Secondary | ICD-10-CM | POA: Diagnosis not present

## 2018-12-13 DIAGNOSIS — D61818 Other pancytopenia: Secondary | ICD-10-CM

## 2018-12-13 DIAGNOSIS — D801 Nonfamilial hypogammaglobulinemia: Secondary | ICD-10-CM | POA: Diagnosis not present

## 2018-12-13 DIAGNOSIS — D649 Anemia, unspecified: Secondary | ICD-10-CM

## 2018-12-13 DIAGNOSIS — R0602 Shortness of breath: Secondary | ICD-10-CM | POA: Diagnosis not present

## 2018-12-13 DIAGNOSIS — E039 Hypothyroidism, unspecified: Secondary | ICD-10-CM

## 2018-12-13 DIAGNOSIS — Z9221 Personal history of antineoplastic chemotherapy: Secondary | ICD-10-CM | POA: Diagnosis not present

## 2018-12-13 LAB — CBC WITH DIFFERENTIAL (CANCER CENTER ONLY)
Abs Immature Granulocytes: 0.01 10*3/uL (ref 0.00–0.07)
Basophils Absolute: 0 10*3/uL (ref 0.0–0.1)
Basophils Relative: 0 %
Eosinophils Absolute: 0 10*3/uL (ref 0.0–0.5)
Eosinophils Relative: 1 %
HCT: 20.7 % — ABNORMAL LOW (ref 36.0–46.0)
Hemoglobin: 6.9 g/dL — CL (ref 12.0–15.0)
Immature Granulocytes: 1 %
Lymphocytes Relative: 86 %
Lymphs Abs: 1.9 10*3/uL (ref 0.7–4.0)
MCH: 29 pg (ref 26.0–34.0)
MCHC: 33.3 g/dL (ref 30.0–36.0)
MCV: 87 fL (ref 80.0–100.0)
Monocytes Absolute: 0.1 10*3/uL (ref 0.1–1.0)
Monocytes Relative: 2 %
Neutro Abs: 0.2 10*3/uL — CL (ref 1.7–7.7)
Neutrophils Relative %: 10 %
Platelet Count: 6 10*3/uL — CL (ref 150–400)
RBC: 2.38 MIL/uL — ABNORMAL LOW (ref 3.87–5.11)
RDW: 12.1 % (ref 11.5–15.5)
WBC Count: 2.2 10*3/uL — ABNORMAL LOW (ref 4.0–10.5)
nRBC: 0 % (ref 0.0–0.2)

## 2018-12-13 LAB — PREPARE RBC (CROSSMATCH)

## 2018-12-13 LAB — SAVE SMEAR(SSMR), FOR PROVIDER SLIDE REVIEW

## 2018-12-13 LAB — PLATELET COUNT (CANCER CENTER ONLY): Platelet Count: 17 10*3/uL — ABNORMAL LOW (ref 150–400)

## 2018-12-13 MED ORDER — ACETAMINOPHEN 325 MG PO TABS
650.0000 mg | ORAL_TABLET | Freq: Once | ORAL | Status: AC
  Administered 2018-12-13: 650 mg via ORAL

## 2018-12-13 MED ORDER — DIPHENHYDRAMINE HCL 25 MG PO CAPS
25.0000 mg | ORAL_CAPSULE | Freq: Once | ORAL | Status: AC
  Administered 2018-12-13: 25 mg via ORAL

## 2018-12-13 MED ORDER — SODIUM CHLORIDE 0.9% IV SOLUTION
250.0000 mL | Freq: Once | INTRAVENOUS | Status: AC
  Administered 2018-12-13: 10:00:00 250 mL via INTRAVENOUS
  Filled 2018-12-13: qty 250

## 2018-12-13 MED ORDER — TBO-FILGRASTIM 300 MCG/0.5ML ~~LOC~~ SOSY
PREFILLED_SYRINGE | SUBCUTANEOUS | Status: AC
  Filled 2018-12-13: qty 0.5

## 2018-12-13 MED ORDER — ACETAMINOPHEN 325 MG PO TABS
ORAL_TABLET | ORAL | Status: AC
  Filled 2018-12-13: qty 2

## 2018-12-13 MED ORDER — DIPHENHYDRAMINE HCL 25 MG PO CAPS
ORAL_CAPSULE | ORAL | Status: AC
  Filled 2018-12-13: qty 1

## 2018-12-13 MED ORDER — TBO-FILGRASTIM 300 MCG/0.5ML ~~LOC~~ SOSY
300.0000 ug | PREFILLED_SYRINGE | Freq: Once | SUBCUTANEOUS | Status: AC
  Administered 2018-12-13: 14:00:00 300 ug via SUBCUTANEOUS

## 2018-12-13 MED ORDER — SODIUM CHLORIDE 0.9% IV SOLUTION
250.0000 mL | Freq: Once | INTRAVENOUS | Status: DC
  Filled 2018-12-13: qty 250

## 2018-12-13 MED ORDER — SODIUM CHLORIDE 0.9% FLUSH
10.0000 mL | INTRAVENOUS | Status: DC | PRN
  Administered 2018-12-13: 10 mL
  Filled 2018-12-13: qty 10

## 2018-12-13 MED ORDER — HEPARIN SOD (PORK) LOCK FLUSH 100 UNIT/ML IV SOLN
250.0000 [IU] | INTRAVENOUS | Status: AC | PRN
  Administered 2018-12-13: 14:00:00 250 [IU]
  Filled 2018-12-13: qty 5

## 2018-12-13 NOTE — Progress Notes (Signed)
Per Dr. Benay Spice, at patient request--she does not need premeds for blood/platelets. Notified blood bank of need for 1 unit today. Pink top sample on the way once drawn in infusion area today.

## 2018-12-13 NOTE — Progress Notes (Unsigned)
MD wants platelets given on 12/14/18 (not crossmatched) since she did not get as good a bump as he had hoped. Blood bank notified and scheduling message sent as well.

## 2018-12-13 NOTE — Progress Notes (Signed)
[place] OFFICE PROGRESS NOTE   Diagnosis: CLL  INTERVAL HISTORY:   Ms. Ailes returns as scheduled.  She completed a treatment with rituximab on 12/06/2018.  She tolerated treatment well.  No symptom of an allergic reaction. She has been maintained on Nplate for 2 doses and every other day G-CSF.  No fever or bleeding. She developed exertional dyspnea beginning on 12/10/2018.  No other complaint.  Objective:  Vital signs in last 24 hours:  Blood pressure 112/63, pulse 93, temperature (!) 97 F (36.1 C), temperature source Oral, resp. rate 18, height _0  (1.676 m), weight 132 lb (59.9 kg), SpO2 100 %.    Physical examination not performed today secondary to distancing with the COVID pandemic  Portacath/PICC-without erythema  Lab Results:  Lab Results  Component Value Date   WBC 2.2 (L) 12/13/2018   HGB 6.9 (LL) 12/13/2018   HCT 20.7 (L) 12/13/2018   MCV 87.0 12/13/2018   PLT 6 (LL) 12/13/2018   NEUTROABS 0.2 (LL) 12/13/2018    CMP  Lab Results  Component Value Date   NA 142 12/02/2018   K 4.2 12/02/2018   CL 106 12/02/2018   CO2 28 12/02/2018   GLUCOSE 112 (H) 12/02/2018   BUN 19 12/02/2018   CREATININE 0.94 12/02/2018   CALCIUM 9.8 12/02/2018   PROT 6.4 (L) 12/02/2018   ALBUMIN 4.3 12/02/2018   AST 18 12/02/2018   ALT 29 12/02/2018   ALKPHOS 89 12/02/2018   BILITOT 0.5 12/02/2018   GFRNONAA >60 12/02/2018   GFRAA >60 12/02/2018     Medications: I have reviewed the patient's current medications.   Assessment/Plan:  1. 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 Mild spinal megaly.  Ultrasound-guided biopsy of the left inguinal lymph node 09/18/2017,  slightly "purulent "fluid aspirated, core biopsy is consistent with an atypical lymphoid proliferation-extensive necrosis with surrounding epithelioid histiocytes, limited intact lymphoid tissue involved with CLL  Incisional biopsy of a necrotic/purulent left inguinal lymph node on 10/01/2017-extensive necrosis with granulomatous inflammation, small amount of viable lymphoid tissue involved with CLL, AFB and fungal stains negative  Peripheral blood FISH analysis 02/05/2018- deletion 13q14, no evidence of p53 (17p13) deletion, no evidence of 11q22deletion  Bone marrow biopsy 02/26/2018-hypercellular marrow with extensive involvement by CLL, lymphocytes represent85% of all cells  Ibrutinib initiated 04/03/2018  Ibrutinib placed on hold 04/11/2018 due to onset of arthralgias  Ibrutinib resumed 04/16/2018, discontinued 04/25/2018 secondary to severe arthralgias/arthritis  Ibrutinib resumed at a dose of 140 mg daily 05/03/2018  Ibrutinib dose adjusted to 140 mg alternating with 220m 06/25/2018  Ibrutinib discontinued 07/03/2018 secondary to severe arthralgias  Acalabrutinib 08/16/2018, discontinued 11/15/2018 secondary to persistent severe transfusion dependent anemia and neutropenia/thrombocytopenia, last dose 11/14/2018  Bone marrow biopsy 11/21/2018- decreased cellularity, involvement by CLL, decreased erythroid and granulocytic precursors, decreased megakaryocytes  Cycle 1 rituximab 12/06/2018  2.Hypothyroidism 3.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 Augmentin prescribed. 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  Pancytopenia secondary to CLL and a hypocellular bone marrow  G-CSF and Nplate started 01/31/6578, G-CSF changed to daily beginning 12/17/2018   Disposition: Ms. Shappell appears unchanged.  She has persistent severe pancytopenia.  She will receive red cells and platelets today.  The plan is to continue platelet transfusion support for a count of less than 10,000.  I discussed the case with Dr. Prince Solian at [facility].  She plans to see [name] and consider a repeat bone marrow biopsy on 12/24/2018.  She recommends holding rituximab and continuing G-CSF/Nplate.  We will change the G-CSF to daily dosing.  This will begin next week with the Cancer center being closed for the [facility] Day holiday.  Ms. Moustafa will be scheduled for an office visit next week.  Betsy Coder, MD  12/13/2018  10:44 AM

## 2018-12-13 NOTE — Progress Notes (Signed)
Per patient, she prefers to have premeds before platelet transfusion today.   Per Dr. Benay Spice, no rituxan today.

## 2018-12-13 NOTE — Patient Instructions (Signed)
Platelet Transfusion A platelet transfusion is a procedure in which you receive donated platelets through an IV. Platelets are tiny pieces of blood cells. When you get an injury, platelets clump together in the area to form a blood clot. This helps stop bleeding and is the beginning of the healing process. If you have too few platelets, your blood may have trouble clotting. This may cause you to bleed and bruise very easily. You may need a platelet transfusion if you have a condition that causes a low number of platelets (thrombocytopenia). A platelet transfusion may be used to stop or prevent excessive bleeding. Tell a health care provider about:  Any reactions you have had during previous transfusions.  Any allergies you have.  All medicines you are taking, including vitamins, herbs, eye drops, creams, and over-the-counter medicines.  Any blood disorders you have.  Any surgeries you have had.  Any medical conditions you have.  Whether you are pregnant or may be pregnant. What are the risks? Generally, this is a safe procedure. However, problems may occur, including:  Fever.  Infection.  Allergic reaction to the donor platelets.  Your body's disease-fighting system (immune system) attacking the donor platelets (hemolytic reaction). This is rare.  A rare reaction that causes lung damage (transfusion-related acute lung injury). What happens before the procedure? Medicines  Ask your health care provider about: ? Changing or stopping your regular medicines. This is especially important if you are taking diabetes medicines or blood thinners. ? Taking medicines such as aspirin and ibuprofen. These medicines can thin your blood. Do not take these medicines unless your health care provider tells you to take them. ? Taking over-the-counter medicines, vitamins, herbs, and supplements. General instructions  You will have a blood test to determine your blood type. Your blood type  determines what kind of platelets you will be given.  Follow instructions from your health care provider about eating or drinking restrictions.  If you have had an allergic reaction to a transfusion in the past, you may be given medicine to help prevent a reaction.  Your temperature, blood pressure, pulse, and breathing will be monitored. What happens during the procedure?   An IV will be inserted into one of your veins.  For your safety, two health care providers will verify your identity along with the donor platelets about to be infused.  A bag of donor platelets will be connected to your IV. The platelets will flow into your bloodstream. This usually takes 30-60 minutes.  Your temperature, blood pressure, pulse, and breathing will be monitored during the transfusion. This helps detect early signs of any reaction.  You will also be monitored for other symptoms that may indicate a reaction, including chills, hives, or itching.  If you have signs of a reaction at any time, your transfusion will be stopped, and you may be given medicine to help manage the reaction.  When your transfusion is complete, your IV will be removed.  Pressure may be applied to the IV site for a few minutes to stop any bleeding.  The IV site will be covered with a bandage (dressing). The procedure may vary among health care providers and hospitals. What happens after the procedure?  Your blood pressure, temperature, pulse, and breathing will be monitored until you leave the hospital or clinic.  You may have some bruising and soreness at your IV site. Follow these instructions at home: Medicines  Take over-the-counter and prescription medicines only as told by your health care provider.  Talk with your health care provider before you take any medicines that contain aspirin or NSAIDs. These medicines increase your risk for dangerous bleeding. General instructions  Change or remove your dressing as told  by your health care provider.  Return to your normal activities as told by your health care provider. Ask your health care provider what activities are safe for you.  Do not take baths, swim, or use a hot tub until your health care provider approves. Ask your health care provider if you may take showers.  Check your IV site every day for signs of infection. Check for: ? Redness, swelling, or pain. ? Fluid or blood. If fluid or blood drains from your IV site, use your hands to press down firmly on a bandage covering the area for a minute or two. Doing this should stop the bleeding. ? Warmth. ? Pus or a bad smell.  Keep all follow-up visits as told by your health care provider. This is important. Contact a health care provider if you have:  A headache that does not go away with medicine.  Hives, rash, or itchy skin.  Nausea or vomiting.  Unusual tiredness or weakness.  Signs of infection at your IV site. Get help right away if:  You have a fever or chills.  You urinate less often than usual.  Your urine is darker colored than normal.  You have any of the following: ? Trouble breathing. ? Pain in your back, abdomen, or chest. ? Cool, clammy skin. ? A fast heartbeat. Summary  Platelets are tiny pieces of blood cells that clump together to form a blood clot when you have an injury. If you have too few platelets, your blood may have trouble clotting.  A platelet transfusion is a procedure in which you receive donated platelets through an IV.  A platelet transfusion may be used to stop or prevent excessive bleeding.  After the procedure, check your IV site every day for signs of infection, including redness, swelling, pain, or warmth. This information is not intended to replace advice given to you by your health care provider. Make sure you discuss any questions you have with your health care provider. Document Released: 05/07/2007 Document Revised: 08/15/2017 Document  Reviewed: 08/15/2017 Elsevier Interactive Patient Education  2019 [place].   Blood Transfusion, Adult, Care After This sheet gives you information about how to care for yourself after your procedure. Your doctor may also give you more specific instructions. If you have problems or questions, contact your doctor. Follow these instructions at home:   Take over-the-counter and prescription medicines only as told by your doctor.  Go back to your normal activities as told by your doctor.  Follow instructions from your doctor about how to take care of the area where an IV tube was put into your vein (insertion site). Make sure you: ? Wash your hands with soap and water before you change your bandage (dressing). If there is no soap and water, use hand sanitizer. ? Change your bandage as told by your doctor.  Check your IV insertion site every day for signs of infection. Check for: ? More redness, swelling, or pain. ? More fluid or blood. ? Warmth. ? Pus or a bad smell. Contact a doctor if:  You have more redness, swelling, or pain around the IV insertion site.  You have more fluid or blood coming from the IV insertion site.  Your IV insertion site feels warm to the touch.  You have pus  or a bad smell coming from the IV insertion site.  Your pee (urine) turns pink, red, or brown.  You feel weak after doing your normal activities. Get help right away if:  You have signs of a serious allergic or body defense (immune) system reaction, including: ? Itchiness. ? Hives. ? Trouble breathing. ? Anxiety. ? Pain in your chest or lower back. ? Fever, flushing, and chills. ? Fast pulse. ? Rash. ? Watery poop (diarrhea). ? Throwing up (vomiting). ? Dark pee. ? Serious headache. ? Dizziness. ? Stiff neck. ? Yellow color in your face or the white parts of your eyes (jaundice). Summary  After a blood transfusion, return to your normal activities as told by your doctor.  Every  day, check for signs of infection where the IV tube was put into your vein.  Some signs of infection are warm skin, more redness and pain, more fluid or blood, and pus or a bad smell where the needle went in.  Contact your doctor if you feel weak or have any unusual symptoms. This information is not intended to replace advice given to you by your health care provider. Make sure you discuss any questions you have with your health care provider. Document Released: 07/31/2014 Document Revised: 03/03/2016 Document Reviewed: 03/03/2016 Elsevier Interactive Patient Education  [employer].

## 2018-12-13 NOTE — Progress Notes (Signed)
Patient stated that she had  irritation around dressing site. OPSITE DRESSING placed. Also wrote special comment in snapshot for everyone to use opsite dressing

## 2018-12-14 ENCOUNTER — Inpatient Hospital Stay: Payer: BC Managed Care – PPO

## 2018-12-14 VITALS — BP 106/58 | HR 75 | Temp 98.3°F | Resp 18

## 2018-12-14 DIAGNOSIS — Z9221 Personal history of antineoplastic chemotherapy: Secondary | ICD-10-CM | POA: Diagnosis not present

## 2018-12-14 DIAGNOSIS — C9110 Chronic lymphocytic leukemia of B-cell type not having achieved remission: Secondary | ICD-10-CM | POA: Diagnosis not present

## 2018-12-14 DIAGNOSIS — D801 Nonfamilial hypogammaglobulinemia: Secondary | ICD-10-CM | POA: Diagnosis not present

## 2018-12-14 DIAGNOSIS — D61818 Other pancytopenia: Secondary | ICD-10-CM | POA: Diagnosis not present

## 2018-12-14 DIAGNOSIS — Z8781 Personal history of (healed) traumatic fracture: Secondary | ICD-10-CM | POA: Diagnosis not present

## 2018-12-14 DIAGNOSIS — D696 Thrombocytopenia, unspecified: Secondary | ICD-10-CM

## 2018-12-14 DIAGNOSIS — E039 Hypothyroidism, unspecified: Secondary | ICD-10-CM | POA: Diagnosis not present

## 2018-12-14 DIAGNOSIS — R0602 Shortness of breath: Secondary | ICD-10-CM | POA: Diagnosis not present

## 2018-12-14 DIAGNOSIS — Z79899 Other long term (current) drug therapy: Secondary | ICD-10-CM | POA: Diagnosis not present

## 2018-12-14 MED ORDER — TBO-FILGRASTIM 300 MCG/0.5ML ~~LOC~~ SOSY
PREFILLED_SYRINGE | SUBCUTANEOUS | Status: AC
  Filled 2018-12-14: qty 0.5

## 2018-12-14 MED ORDER — SODIUM CHLORIDE 0.9% IV SOLUTION
250.0000 mL | Freq: Once | INTRAVENOUS | Status: AC
  Administered 2018-12-14: 09:00:00 250 mL via INTRAVENOUS
  Filled 2018-12-14: qty 250

## 2018-12-14 MED ORDER — HEPARIN SOD (PORK) LOCK FLUSH 100 UNIT/ML IV SOLN
250.0000 [IU] | INTRAVENOUS | Status: AC | PRN
  Administered 2018-12-14: 10:00:00 250 [IU]
  Filled 2018-12-14: qty 5

## 2018-12-14 MED ORDER — TBO-FILGRASTIM 300 MCG/0.5ML ~~LOC~~ SOSY
300.0000 ug | PREFILLED_SYRINGE | Freq: Once | SUBCUTANEOUS | Status: AC
  Administered 2018-12-14: 10:00:00 300 ug via SUBCUTANEOUS

## 2018-12-14 MED ORDER — SODIUM CHLORIDE 0.9% FLUSH
10.0000 mL | INTRAVENOUS | Status: AC | PRN
  Administered 2018-12-14: 10:00:00 10 mL
  Filled 2018-12-14: qty 10

## 2018-12-14 NOTE — Patient Instructions (Signed)
Feel free to call the clinic should you have any questions or concerns. The clinic phone number is [phone].  Please show the [facility] at check-in to the Emergency Department and triage nurse.  Platelet Transfusion A platelet transfusion is a procedure in which you receive donated platelets through an IV. Platelets are tiny pieces of blood cells. When you get an injury, platelets clump together in the area to form a blood clot. This helps stop bleeding and is the beginning of the healing process. If you have too few platelets, your blood may have trouble clotting. This may cause you to bleed and bruise very easily. You may need a platelet transfusion if you have a condition that causes a low number of platelets (thrombocytopenia). A platelet transfusion may be used to stop or prevent excessive bleeding. Tell a health care provider about:  Any reactions you have had during previous transfusions.  Any allergies you have.  All medicines you are taking, including vitamins, herbs, eye drops, creams, and over-the-counter medicines.  Any blood disorders you have.  Any surgeries you have had.  Any medical conditions you have.  Whether you are pregnant or may be pregnant. What are the risks? Generally, this is a safe procedure. However, problems may occur, including:  Fever.  Infection.  Allergic reaction to the donor platelets.  Your body's disease-fighting system (immune system) attacking the donor platelets (hemolytic reaction). This is rare.  A rare reaction that causes lung damage (transfusion-related acute lung injury). What happens before the procedure? Medicines  Ask your health care provider about: ? Changing or stopping your regular medicines. This is especially important if you are taking diabetes medicines or blood thinners. ? Taking medicines such as aspirin and ibuprofen. These medicines can thin your blood. Do not take these medicines unless your health  care provider tells you to take them. ? Taking over-the-counter medicines, vitamins, herbs, and supplements. General instructions  You will have a blood test to determine your blood type. Your blood type determines what kind of platelets you will be given.  Follow instructions from your health care provider about eating or drinking restrictions.  If you have had an allergic reaction to a transfusion in the past, you may be given medicine to help prevent a reaction.  Your temperature, blood pressure, pulse, and breathing will be monitored. What happens during the procedure?   An IV will be inserted into one of your veins.  For your safety, two health care providers will verify your identity along with the donor platelets about to be infused.  A bag of donor platelets will be connected to your IV. The platelets will flow into your bloodstream. This usually takes 30-60 minutes.  Your temperature, blood pressure, pulse, and breathing will be monitored during the transfusion. This helps detect early signs of any reaction.  You will also be monitored for other symptoms that may indicate a reaction, including chills, hives, or itching.  If you have signs of a reaction at any time, your transfusion will be stopped, and you may be given medicine to help manage the reaction.  When your transfusion is complete, your IV will be removed.  Pressure may be applied to the IV site for a few minutes to stop any bleeding.  The IV site will be covered with a bandage (dressing). The procedure may vary among health care providers and hospitals. What happens after the procedure?  Your blood pressure, temperature, pulse, and breathing will be monitored until  you leave the hospital or clinic.  You may have some bruising and soreness at your IV site. Follow these instructions at home: Medicines  Take over-the-counter and prescription medicines only as told by your health care provider.  Talk with  your health care provider before you take any medicines that contain aspirin or NSAIDs. These medicines increase your risk for dangerous bleeding. General instructions  Change or remove your dressing as told by your health care provider.  Return to your normal activities as told by your health care provider. Ask your health care provider what activities are safe for you.  Do not take baths, swim, or use a hot tub until your health care provider approves. Ask your health care provider if you may take showers.  Check your IV site every day for signs of infection. Check for: ? Redness, swelling, or pain. ? Fluid or blood. If fluid or blood drains from your IV site, use your hands to press down firmly on a bandage covering the area for a minute or two. Doing this should stop the bleeding. ? Warmth. ? Pus or a bad smell.  Keep all follow-up visits as told by your health care provider. This is important. Contact a health care provider if you have:  A headache that does not go away with medicine.  Hives, rash, or itchy skin.  Nausea or vomiting.  Unusual tiredness or weakness.  Signs of infection at your IV site. Get help right away if:  You have a fever or chills.  You urinate less often than usual.  Your urine is darker colored than normal.  You have any of the following: ? Trouble breathing. ? Pain in your back, abdomen, or chest. ? Cool, clammy skin. ? A fast heartbeat. Summary  Platelets are tiny pieces of blood cells that clump together to form a blood clot when you have an injury. If you have too few platelets, your blood may have trouble clotting.  A platelet transfusion is a procedure in which you receive donated platelets through an IV.  A platelet transfusion may be used to stop or prevent excessive bleeding.  After the procedure, check your IV site every day for signs of infection, including redness, swelling, pain, or warmth. This information is not intended to  replace advice given to you by your health care provider. Make sure you discuss any questions you have with your health care provider. Document Released: 05/07/2007 Document Revised: 08/15/2017 Document Reviewed: 08/15/2017 Elsevier Interactive Patient Education  2019 [place].   Blood Transfusion, Adult  A blood transfusion is a procedure in which you receive donated blood, including plasma, platelets, and red blood cells, through an IV tube. You may need a blood transfusion because of illness, surgery, or injury. The blood may come from a donor. You may also be able to donate blood for yourself (autologous blood donation) before a surgery if you know that you might require a blood transfusion. The blood given in a transfusion is made up of different types of cells. You may receive:  Red blood cells. These carry oxygen to the cells in the body.  White blood cells. These help you fight infections.  Platelets. These help your blood to clot.  Plasma. This is the liquid part of your blood and it helps with fluid imbalances. If you have hemophilia or another clotting disorder, you may also receive other types of blood products. Tell a health care provider about:  Any allergies you have.  All medicines  you are taking, including vitamins, herbs, eye drops, creams, and over-the-counter medicines.  Any problems you or family members have had with anesthetic medicines.  Any blood disorders you have.  Any surgeries you have had.  Any medical conditions you have, including any recent fever or cold symptoms.  Whether you are pregnant or may be pregnant.  Any previous reactions you have had during a blood transfusion. What are the risks? Generally, this is a safe procedure. However, problems may occur, including:  Having an allergic reaction to something in the donated blood. Hives and itching may be symptoms of this type of reaction.  Fever. This may be a reaction to the white blood  cells in the transfused blood. Nausea or chest pain may accompany a fever.  Iron overload. This can happen from having many transfusions.  Transfusion-related acute lung injury (TRALI). This is a rare reaction that causes lung damage. The cause is not known.TRALI can occur within hours of a transfusion or several days later.  Sudden (acute) or delayed hemolytic reactions. This happens if your blood does not match the cells in your transfusion. Your body's defense system (immune system) may try to attack the new cells. This complication is rare. The symptoms include fever, chills, nausea, and low back pain or chest pain.  Infection or disease transmission. This is rare. What happens before the procedure?  You will have a blood test to determine your blood type. This is necessary to know what kind of blood your body will accept and to match it to the donor blood.  If you are going to have a planned surgery, you may be able to do an autologous blood donation. This may be done in case you need to have a transfusion.  If you have had an allergic reaction to a transfusion in the past, you may be given medicine to help prevent a reaction. This medicine may be given to you by mouth or through an IV tube.  You will have your temperature, blood pressure, and pulse monitored before the transfusion.  Follow instructions from your health care provider about eating and drinking restrictions.  Ask your health care provider about: ? Changing or stopping your regular medicines. This is especially important if you are taking diabetes medicines or blood thinners. ? Taking medicines such as aspirin and ibuprofen. These medicines can thin your blood. Do not take these medicines before your procedure if your health care provider instructs you not to. What happens during the procedure?  An IV tube will be inserted into one of your veins.  The bag of donated blood will be attached to your IV tube. The blood  will then enter through your vein.  Your temperature, blood pressure, and pulse will be monitored regularly during the transfusion. This monitoring is done to detect early signs of a transfusion reaction.  If you have any signs or symptoms of a reaction, your transfusion will be stopped and you may be given medicine.  When the transfusion is complete, your IV tube will be removed.  Pressure may be applied to the IV site for a few minutes.  A bandage (dressing) will be applied. The procedure may vary among health care providers and hospitals. What happens after the procedure?  Your temperature, blood pressure, heart rate, breathing rate, and blood oxygen level will be monitored often.  Your blood may be tested to see how you are responding to the transfusion.  You may be warmed with fluids or blankets to maintain  a normal body temperature. Summary  A blood transfusion is a procedure in which you receive donated blood, including plasma, platelets, and red blood cells, through an IV tube.  Your temperature, blood pressure, and pulse will be monitored before, during, and after the transfusion.  Your blood may be tested after the transfusion to see how your body has responded. This information is not intended to replace advice given to you by your health care provider. Make sure you discuss any questions you have with your health care provider. Document Released: 07/07/2000 Document Revised: 04/06/2016 Document Reviewed: 04/06/2016 Elsevier Interactive Patient Education  [employer].

## 2018-12-15 LAB — PREPARE PLATELET PHERESIS
Unit division: 0
Unit division: 0

## 2018-12-15 LAB — BPAM PLATELET PHERESIS
Blood Product Expiration Date: 202005232359
Blood Product Expiration Date: 202005252359
ISSUE DATE / TIME: 202005221032
ISSUE DATE / TIME: 202005230922
Unit Type and Rh: 5100
Unit Type and Rh: 5100

## 2018-12-15 LAB — BPAM RBC
Blood Product Expiration Date: 202006152359
ISSUE DATE / TIME: 202005221213
Unit Type and Rh: 5100

## 2018-12-15 LAB — TYPE AND SCREEN
ABO/RH(D): O POS
Antibody Screen: NEGATIVE
Unit division: 0

## 2018-12-17 ENCOUNTER — Inpatient Hospital Stay: Payer: BC Managed Care – PPO

## 2018-12-17 ENCOUNTER — Other Ambulatory Visit: Payer: Self-pay | Admitting: *Deleted

## 2018-12-17 ENCOUNTER — Other Ambulatory Visit: Payer: Self-pay

## 2018-12-17 ENCOUNTER — Telehealth: Payer: Self-pay | Admitting: Oncology

## 2018-12-17 ENCOUNTER — Telehealth: Payer: Self-pay | Admitting: *Deleted

## 2018-12-17 VITALS — BP 96/55 | HR 84 | Temp 98.3°F | Resp 18

## 2018-12-17 DIAGNOSIS — D61818 Other pancytopenia: Secondary | ICD-10-CM | POA: Diagnosis not present

## 2018-12-17 DIAGNOSIS — E039 Hypothyroidism, unspecified: Secondary | ICD-10-CM | POA: Diagnosis not present

## 2018-12-17 DIAGNOSIS — C9110 Chronic lymphocytic leukemia of B-cell type not having achieved remission: Secondary | ICD-10-CM

## 2018-12-17 DIAGNOSIS — D696 Thrombocytopenia, unspecified: Secondary | ICD-10-CM

## 2018-12-17 DIAGNOSIS — Z79899 Other long term (current) drug therapy: Secondary | ICD-10-CM | POA: Diagnosis not present

## 2018-12-17 DIAGNOSIS — D801 Nonfamilial hypogammaglobulinemia: Secondary | ICD-10-CM | POA: Diagnosis not present

## 2018-12-17 DIAGNOSIS — Z9221 Personal history of antineoplastic chemotherapy: Secondary | ICD-10-CM | POA: Diagnosis not present

## 2018-12-17 DIAGNOSIS — Z8781 Personal history of (healed) traumatic fracture: Secondary | ICD-10-CM | POA: Diagnosis not present

## 2018-12-17 DIAGNOSIS — R0602 Shortness of breath: Secondary | ICD-10-CM | POA: Diagnosis not present

## 2018-12-17 LAB — CBC WITH DIFFERENTIAL (CANCER CENTER ONLY)
Abs Immature Granulocytes: 0.07 10*3/uL (ref 0.00–0.07)
Basophils Absolute: 0 10*3/uL (ref 0.0–0.1)
Basophils Relative: 0 %
Eosinophils Absolute: 0 10*3/uL (ref 0.0–0.5)
Eosinophils Relative: 0 %
HCT: 25.7 % — ABNORMAL LOW (ref 36.0–46.0)
Hemoglobin: 8.5 g/dL — ABNORMAL LOW (ref 12.0–15.0)
Immature Granulocytes: 3 %
Lymphocytes Relative: 87 %
Lymphs Abs: 2 10*3/uL (ref 0.7–4.0)
MCH: 29.6 pg (ref 26.0–34.0)
MCHC: 33.1 g/dL (ref 30.0–36.0)
MCV: 89.5 fL (ref 80.0–100.0)
Monocytes Absolute: 0 10*3/uL — ABNORMAL LOW (ref 0.1–1.0)
Monocytes Relative: 2 %
Neutro Abs: 0.2 10*3/uL — CL (ref 1.7–7.7)
Neutrophils Relative %: 8 %
Platelet Count: 19 10*3/uL — ABNORMAL LOW (ref 150–400)
RBC: 2.87 MIL/uL — ABNORMAL LOW (ref 3.87–5.11)
RDW: 12.5 % (ref 11.5–15.5)
WBC Count: 2.3 10*3/uL — ABNORMAL LOW (ref 4.0–10.5)
nRBC: 0 % (ref 0.0–0.2)

## 2018-12-17 LAB — SAMPLE TO BLOOD BANK

## 2018-12-17 MED ORDER — TBO-FILGRASTIM 300 MCG/0.5ML ~~LOC~~ SOSY
300.0000 ug | PREFILLED_SYRINGE | Freq: Once | SUBCUTANEOUS | Status: AC
  Administered 2018-12-17: 300 ug via SUBCUTANEOUS

## 2018-12-17 MED ORDER — ROMIPLOSTIM 250 MCG ~~LOC~~ SOLR
3.0000 ug/kg | Freq: Once | SUBCUTANEOUS | Status: AC
  Administered 2018-12-17: 180 ug via SUBCUTANEOUS
  Filled 2018-12-17: qty 0.36

## 2018-12-17 MED ORDER — SODIUM CHLORIDE 0.9% FLUSH
10.0000 mL | INTRAVENOUS | Status: DC | PRN
  Administered 2018-12-17: 10 mL
  Filled 2018-12-17: qty 10

## 2018-12-17 MED ORDER — ALTEPLASE 2 MG IJ SOLR
2.0000 mg | Freq: Once | INTRAMUSCULAR | Status: DC | PRN
  Filled 2018-12-17: qty 2

## 2018-12-17 MED ORDER — HEPARIN SOD (PORK) LOCK FLUSH 100 UNIT/ML IV SOLN
500.0000 [IU] | Freq: Once | INTRAVENOUS | Status: AC | PRN
  Administered 2018-12-17: 250 [IU]
  Filled 2018-12-17: qty 5

## 2018-12-17 MED ORDER — TBO-FILGRASTIM 300 MCG/0.5ML ~~LOC~~ SOSY
PREFILLED_SYRINGE | SUBCUTANEOUS | Status: AC
  Filled 2018-12-17: qty 0.5

## 2018-12-17 NOTE — Telephone Encounter (Signed)
Per 5/22 los appts already scheduled.

## 2018-12-17 NOTE — Telephone Encounter (Signed)
Received call report from [place].  "Today's [place] = 0.23."  Secure Chat message sent with results.  Received read confirmation.

## 2018-12-17 NOTE — Progress Notes (Signed)
Dr. Benay Spice aware of CBC. Proceed with Granix today per Dr. Benay Spice.  Hardie Pulley, PharmD, BCPS, BCOP

## 2018-12-17 NOTE — Patient Instructions (Signed)
Tbo-Filgrastim injection(Granix)  What is this medicine? TBO-FILGRASTIM (T B O fil GRA stim) is a granulocyte colony-stimulating factor that stimulates the growth of neutrophils, a type of white blood cell important in the body's fight against infection. It is used to reduce the incidence of fever and infection in patients with certain types of cancer who are receiving chemotherapy that affects the bone marrow. This medicine may be used for other purposes; ask your health care provider or pharmacist if you have questions. COMMON BRAND NAME(S): Granix What should I tell my health care provider before I take this medicine? They need to know if you have any of these conditions: -bone scan or tests planned -kidney disease -sickle cell anemia -an unusual or allergic reaction to tbo-filgrastim, filgrastim, pegfilgrastim, other medicines, foods, dyes, or preservatives -pregnant or trying to get pregnant -breast-feeding How should I use this medicine? This medicine is for injection under the skin. If you get this medicine at home, you will be taught how to prepare and give this medicine. Refer to the Instructions for Use that come with your medication packaging. Use exactly as directed. Take your medicine at regular intervals. Do not take your medicine more often than directed. It is important that you put your used needles and syringes in a special sharps container. Do not put them in a trash can. If you do not have a sharps container, call your pharmacist or healthcare provider to get one. Talk to your pediatrician regarding the use of this medicine in children. While this drug may be prescribed for children as young as 24 month of age for selected conditions, precautions do apply. Overdosage: If you think you have taken too much of this medicine contact a poison control center or emergency room at once. NOTE: This medicine is only for you. Do not share this medicine with others. What if I miss a  dose? It is important not to miss your dose. Call your doctor or health care professional if you miss a dose. What may interact with this medicine? This medicine may interact with the following medications: -medicines that may cause a release of neutrophils, such as lithium This list may not describe all possible interactions. Give your health care provider a list of all the medicines, herbs, non-prescription drugs, or dietary supplements you use. Also tell them if you smoke, drink alcohol, or use illegal drugs. Some items may interact with your medicine. What should I watch for while using this medicine? You may need blood work done while you are taking this medicine. What side effects may I notice from receiving this medicine? Side effects that you should report to your doctor or health care professional as soon as possible: -allergic reactions like skin rash, itching or hives, swelling of the face, lips, or tongue -back pain -blood in the urine -dark urine -dizziness -fast heartbeat -feeling faint -shortness of breath or breathing problems -signs and symptoms of infection like fever or chills; cough; or sore throat -signs and symptoms of kidney injury like trouble passing urine or change in the amount of urine -stomach or side pain, or pain at the shoulder -sweating -swelling of the legs, ankles, or abdomen -tiredness Side effects that usually do not require medical attention (report to your doctor or health care professional if they continue or are bothersome): -bone pain -diarrhea -headache -muscle pain -vomiting This list may not describe all possible side effects. Call your doctor for medical advice about side effects. You may report side effects to FDA  at 1-800-FDA-1088. Where should I keep my medicine? Keep out of the reach of children. Store in a refrigerator between 2 and 8 degrees C (36 and 46 degrees F). Keep in carton to protect from light. Throw away this medicine if  it is left out of the refrigerator for more than 5 consecutive days. Throw away any unused medicine after the expiration date. NOTE: This sheet is a summary. It may not cover all possible information. If you have questions about this medicine, talk to your doctor, pharmacist, or health care provider.  2019 Elsevier/Gold Standard (2017-02-27 16:56:18)   Romiplostim injection(Nplate) What is this medicine? ROMIPLOSTIM (roe mi PLOE stim) helps your body make more platelets. This medicine is used to treat low platelets caused by chronic idiopathic thrombocytopenic purpura (ITP). This medicine may be used for other purposes; ask your health care provider or pharmacist if you have questions. COMMON BRAND NAME(S): Nplate What should I tell my health care provider before I take this medicine? They need to know if you have any of these conditions: -bleeding disorders -bone marrow problem, like blood cancer or myelodysplastic syndrome -history of blood clots -liver disease -surgery to remove your spleen -an unusual or allergic reaction to romiplostim, mannitol, other medicines, foods, dyes, or preservatives -pregnant or trying to get pregnant -breast-feeding How should I use this medicine? This medicine is for injection under the skin. It is given by a health care professional in a hospital or clinic setting. A special MedGuide will be given to you before your injection. Read this information carefully each time. Talk to your pediatrician regarding the use of this medicine in children. While this drug may be prescribed for children as young as 1 year for selected conditions, precautions do apply. Overdosage: If you think you have taken too much of this medicine contact a poison control center or emergency room at once. NOTE: This medicine is only for you. Do not share this medicine with others. What if I miss a dose? It is important not to miss your dose. Call your doctor or health care  professional if you are unable to keep an appointment. What may interact with this medicine? Interactions are not expected. This list may not describe all possible interactions. Give your health care provider a list of all the medicines, herbs, non-prescription drugs, or dietary supplements you use. Also tell them if you smoke, drink alcohol, or use illegal drugs. Some items may interact with your medicine. What should I watch for while using this medicine? Your condition will be monitored carefully while you are receiving this medicine. Visit your prescriber or health care professional for regular checks on your progress and for the needed blood tests. It is important to keep all appointments. What side effects may I notice from receiving this medicine? Side effects that you should report to your doctor or health care professional as soon as possible: -allergic reactions like skin rash, itching or hives, swelling of the face, lips, or tongue -signs and symptoms of bleeding such as bloody or black, tarry stools; red or dark brown urine; spitting up blood or brown material that looks like coffee grounds; red spots on the skin; unusual bruising or bleeding from the eyes, gums, or nose -signs and symptoms of a blood clot such as chest pain; shortness of breath; pain, swelling, or warmth in the leg -signs and symptoms of a stroke like changes in vision; confusion; trouble speaking or understanding; severe headaches; sudden numbness or weakness of the face, arm  or leg; trouble walking; dizziness; loss of balance or coordination Side effects that usually do not require medical attention (report to your doctor or health care professional if they continue or are bothersome): -headache -pain in arms and legs -pain in mouth -stomach pain This list may not describe all possible side effects. Call your doctor for medical advice about side effects. You may report side effects to FDA at 1-800-FDA-1088. Where  should I keep my medicine? This drug is given in a hospital or clinic and will not be stored at home. NOTE: This sheet is a summary. It may not cover all possible information. If you have questions about this medicine, talk to your doctor, pharmacist, or health care provider.  2019 Elsevier/Gold Standard (2017-07-09 11:10:55)

## 2018-12-17 NOTE — Progress Notes (Signed)
Blood bank notified to crossmatch platelets from sample sent to them today to be transfused on 12/19/18 per Dr. Benay Spice.

## 2018-12-18 ENCOUNTER — Ambulatory Visit: Payer: BLUE CROSS/BLUE SHIELD

## 2018-12-18 ENCOUNTER — Other Ambulatory Visit: Payer: Self-pay

## 2018-12-18 ENCOUNTER — Inpatient Hospital Stay: Payer: BC Managed Care – PPO

## 2018-12-18 DIAGNOSIS — Z8781 Personal history of (healed) traumatic fracture: Secondary | ICD-10-CM | POA: Diagnosis not present

## 2018-12-18 DIAGNOSIS — C9110 Chronic lymphocytic leukemia of B-cell type not having achieved remission: Secondary | ICD-10-CM | POA: Diagnosis not present

## 2018-12-18 DIAGNOSIS — E039 Hypothyroidism, unspecified: Secondary | ICD-10-CM | POA: Diagnosis not present

## 2018-12-18 DIAGNOSIS — Z79899 Other long term (current) drug therapy: Secondary | ICD-10-CM | POA: Diagnosis not present

## 2018-12-18 DIAGNOSIS — D696 Thrombocytopenia, unspecified: Secondary | ICD-10-CM

## 2018-12-18 DIAGNOSIS — Z9221 Personal history of antineoplastic chemotherapy: Secondary | ICD-10-CM | POA: Diagnosis not present

## 2018-12-18 DIAGNOSIS — D61818 Other pancytopenia: Secondary | ICD-10-CM | POA: Diagnosis not present

## 2018-12-18 DIAGNOSIS — D801 Nonfamilial hypogammaglobulinemia: Secondary | ICD-10-CM | POA: Diagnosis not present

## 2018-12-18 DIAGNOSIS — R0602 Shortness of breath: Secondary | ICD-10-CM | POA: Diagnosis not present

## 2018-12-18 MED ORDER — TBO-FILGRASTIM 300 MCG/0.5ML ~~LOC~~ SOSY
300.0000 ug | PREFILLED_SYRINGE | Freq: Once | SUBCUTANEOUS | Status: AC
  Administered 2018-12-18: 300 ug via SUBCUTANEOUS

## 2018-12-18 MED ORDER — TBO-FILGRASTIM 300 MCG/0.5ML ~~LOC~~ SOSY
PREFILLED_SYRINGE | SUBCUTANEOUS | Status: AC
  Filled 2018-12-18: qty 0.5

## 2018-12-18 NOTE — Patient Instructions (Signed)
Tbo-Filgrastim injection What is this medicine? TBO-FILGRASTIM (T B O fil GRA stim) is a granulocyte colony-stimulating factor that stimulates the growth of neutrophils, a type of white blood cell important in the body's fight against infection. It is used to reduce the incidence of fever and infection in patients with certain types of cancer who are receiving chemotherapy that affects the bone marrow. This medicine may be used for other purposes; ask your health care provider or pharmacist if you have questions. COMMON BRAND NAME(S): Granix What should I tell my health care provider before I take this medicine? They need to know if you have any of these conditions: -bone scan or tests planned -kidney disease -sickle cell anemia -an unusual or allergic reaction to tbo-filgrastim, filgrastim, pegfilgrastim, other medicines, foods, dyes, or preservatives -pregnant or trying to get pregnant -breast-feeding How should I use this medicine? This medicine is for injection under the skin. If you get this medicine at home, you will be taught how to prepare and give this medicine. Refer to the Instructions for Use that come with your medication packaging. Use exactly as directed. Take your medicine at regular intervals. Do not take your medicine more often than directed. It is important that you put your used needles and syringes in a special sharps container. Do not put them in a trash can. If you do not have a sharps container, call your pharmacist or healthcare provider to get one. Talk to your pediatrician regarding the use of this medicine in children. While this drug may be prescribed for children as young as 1 month of age for selected conditions, precautions do apply. Overdosage: If you think you have taken too much of this medicine contact a poison control center or emergency room at once. NOTE: This medicine is only for you. Do not share this medicine with others. What if I miss a dose? It is  important not to miss your dose. Call your doctor or health care professional if you miss a dose. What may interact with this medicine? This medicine may interact with the following medications: -medicines that may cause a release of neutrophils, such as lithium This list may not describe all possible interactions. Give your health care provider a list of all the medicines, herbs, non-prescription drugs, or dietary supplements you use. Also tell them if you smoke, drink alcohol, or use illegal drugs. Some items may interact with your medicine. What should I watch for while using this medicine? You may need blood work done while you are taking this medicine. What side effects may I notice from receiving this medicine? Side effects that you should report to your doctor or health care professional as soon as possible: -allergic reactions like skin rash, itching or hives, swelling of the face, lips, or tongue -back pain -blood in the urine -dark urine -dizziness -fast heartbeat -feeling faint -shortness of breath or breathing problems -signs and symptoms of infection like fever or chills; cough; or sore throat -signs and symptoms of kidney injury like trouble passing urine or change in the amount of urine -stomach or side pain, or pain at the shoulder -sweating -swelling of the legs, ankles, or abdomen -tiredness Side effects that usually do not require medical attention (report to your doctor or health care professional if they continue or are bothersome): -bone pain -diarrhea -headache -muscle pain -vomiting This list may not describe all possible side effects. Call your doctor for medical advice about side effects. You may report side effects to FDA at   1-800-FDA-1088. Where should I keep my medicine? Keep out of the reach of children. Store in a refrigerator between 2 and 8 degrees C (36 and 46 degrees F). Keep in carton to protect from light. Throw away this medicine if it is left out  of the refrigerator for more than 5 consecutive days. Throw away any unused medicine after the expiration date. NOTE: This sheet is a summary. It may not cover all possible information. If you have questions about this medicine, talk to your doctor, pharmacist, or health care provider.  2019 Elsevier/Gold Standard (2017-02-27 16:56:18)  

## 2018-12-19 ENCOUNTER — Other Ambulatory Visit: Payer: Self-pay

## 2018-12-19 ENCOUNTER — Inpatient Hospital Stay: Payer: BC Managed Care – PPO

## 2018-12-19 ENCOUNTER — Ambulatory Visit: Payer: BLUE CROSS/BLUE SHIELD

## 2018-12-19 VITALS — BP 108/62 | HR 74 | Temp 98.2°F | Resp 17

## 2018-12-19 DIAGNOSIS — Z8781 Personal history of (healed) traumatic fracture: Secondary | ICD-10-CM | POA: Diagnosis not present

## 2018-12-19 DIAGNOSIS — Z9221 Personal history of antineoplastic chemotherapy: Secondary | ICD-10-CM | POA: Diagnosis not present

## 2018-12-19 DIAGNOSIS — E039 Hypothyroidism, unspecified: Secondary | ICD-10-CM | POA: Diagnosis not present

## 2018-12-19 DIAGNOSIS — R0602 Shortness of breath: Secondary | ICD-10-CM | POA: Diagnosis not present

## 2018-12-19 DIAGNOSIS — D696 Thrombocytopenia, unspecified: Secondary | ICD-10-CM

## 2018-12-19 DIAGNOSIS — D61818 Other pancytopenia: Secondary | ICD-10-CM | POA: Diagnosis not present

## 2018-12-19 DIAGNOSIS — C9110 Chronic lymphocytic leukemia of B-cell type not having achieved remission: Secondary | ICD-10-CM | POA: Diagnosis not present

## 2018-12-19 DIAGNOSIS — D801 Nonfamilial hypogammaglobulinemia: Secondary | ICD-10-CM | POA: Diagnosis not present

## 2018-12-19 DIAGNOSIS — Z79899 Other long term (current) drug therapy: Secondary | ICD-10-CM | POA: Diagnosis not present

## 2018-12-19 LAB — BPAM PLATELET PHERESIS
Blood Product Expiration Date: 202005271350
Unit Type and Rh: 5100

## 2018-12-19 LAB — PREPARE PLATELET PHERESIS: Unit division: 0

## 2018-12-19 MED ORDER — HEPARIN SOD (PORK) LOCK FLUSH 100 UNIT/ML IV SOLN
500.0000 [IU] | Freq: Once | INTRAVENOUS | Status: DC | PRN
  Filled 2018-12-19: qty 5

## 2018-12-19 MED ORDER — SODIUM CHLORIDE 0.9% FLUSH
10.0000 mL | INTRAVENOUS | Status: DC | PRN
  Filled 2018-12-19: qty 10

## 2018-12-19 MED ORDER — TBO-FILGRASTIM 300 MCG/0.5ML ~~LOC~~ SOSY
300.0000 ug | PREFILLED_SYRINGE | Freq: Once | SUBCUTANEOUS | Status: AC
  Administered 2018-12-19: 300 ug via SUBCUTANEOUS

## 2018-12-19 MED ORDER — TBO-FILGRASTIM 300 MCG/0.5ML ~~LOC~~ SOSY
PREFILLED_SYRINGE | SUBCUTANEOUS | Status: AC
  Filled 2018-12-19: qty 0.5

## 2018-12-19 NOTE — Patient Instructions (Signed)
Tbo-Filgrastim injection What is this medicine? TBO-FILGRASTIM (T B O fil GRA stim) is a granulocyte colony-stimulating factor that stimulates the growth of neutrophils, a type of white blood cell important in the body's fight against infection. It is used to reduce the incidence of fever and infection in patients with certain types of cancer who are receiving chemotherapy that affects the bone marrow. This medicine may be used for other purposes; ask your health care provider or pharmacist if you have questions. COMMON BRAND NAME(S): Granix What should I tell my health care provider before I take this medicine? They need to know if you have any of these conditions: -bone scan or tests planned -kidney disease -sickle cell anemia -an unusual or allergic reaction to tbo-filgrastim, filgrastim, pegfilgrastim, other medicines, foods, dyes, or preservatives -pregnant or trying to get pregnant -breast-feeding How should I use this medicine? This medicine is for injection under the skin. If you get this medicine at home, you will be taught how to prepare and give this medicine. Refer to the Instructions for Use that come with your medication packaging. Use exactly as directed. Take your medicine at regular intervals. Do not take your medicine more often than directed. It is important that you put your used needles and syringes in a special sharps container. Do not put them in a trash can. If you do not have a sharps container, call your pharmacist or healthcare provider to get one. Talk to your pediatrician regarding the use of this medicine in children. While this drug may be prescribed for children as young as 1 month of age for selected conditions, precautions do apply. Overdosage: If you think you have taken too much of this medicine contact a poison control center or emergency room at once. NOTE: This medicine is only for you. Do not share this medicine with others. What if I miss a dose? It is  important not to miss your dose. Call your doctor or health care professional if you miss a dose. What may interact with this medicine? This medicine may interact with the following medications: -medicines that may cause a release of neutrophils, such as lithium This list may not describe all possible interactions. Give your health care provider a list of all the medicines, herbs, non-prescription drugs, or dietary supplements you use. Also tell them if you smoke, drink alcohol, or use illegal drugs. Some items may interact with your medicine. What should I watch for while using this medicine? You may need blood work done while you are taking this medicine. What side effects may I notice from receiving this medicine? Side effects that you should report to your doctor or health care professional as soon as possible: -allergic reactions like skin rash, itching or hives, swelling of the face, lips, or tongue -back pain -blood in the urine -dark urine -dizziness -fast heartbeat -feeling faint -shortness of breath or breathing problems -signs and symptoms of infection like fever or chills; cough; or sore throat -signs and symptoms of kidney injury like trouble passing urine or change in the amount of urine -stomach or side pain, or pain at the shoulder -sweating -swelling of the legs, ankles, or abdomen -tiredness Side effects that usually do not require medical attention (report to your doctor or health care professional if they continue or are bothersome): -bone pain -diarrhea -headache -muscle pain -vomiting This list may not describe all possible side effects. Call your doctor for medical advice about side effects. You may report side effects to FDA at   1-800-FDA-1088. Where should I keep my medicine? Keep out of the reach of children. Store in a refrigerator between 2 and 8 degrees C (36 and 46 degrees F). Keep in carton to protect from light. Throw away this medicine if it is left out  of the refrigerator for more than 5 consecutive days. Throw away any unused medicine after the expiration date. NOTE: This sheet is a summary. It may not cover all possible information. If you have questions about this medicine, talk to your doctor, pharmacist, or health care provider.  2019 Elsevier/Gold Standard (2017-02-27 16:56:18)  

## 2018-12-20 ENCOUNTER — Encounter: Payer: Self-pay | Admitting: Oncology

## 2018-12-20 ENCOUNTER — Inpatient Hospital Stay (HOSPITAL_BASED_OUTPATIENT_CLINIC_OR_DEPARTMENT_OTHER): Payer: BC Managed Care – PPO | Admitting: Oncology

## 2018-12-20 ENCOUNTER — Inpatient Hospital Stay: Payer: BC Managed Care – PPO

## 2018-12-20 ENCOUNTER — Telehealth: Payer: Self-pay | Admitting: *Deleted

## 2018-12-20 ENCOUNTER — Other Ambulatory Visit: Payer: Self-pay

## 2018-12-20 VITALS — BP 97/58 | HR 72 | Temp 98.3°F | Resp 18 | Ht 66.0 in | Wt 130.9 lb

## 2018-12-20 VITALS — BP 97/51 | HR 72 | Temp 98.1°F | Resp 18

## 2018-12-20 DIAGNOSIS — Z9221 Personal history of antineoplastic chemotherapy: Secondary | ICD-10-CM

## 2018-12-20 DIAGNOSIS — Z8781 Personal history of (healed) traumatic fracture: Secondary | ICD-10-CM | POA: Diagnosis not present

## 2018-12-20 DIAGNOSIS — D61818 Other pancytopenia: Secondary | ICD-10-CM | POA: Diagnosis not present

## 2018-12-20 DIAGNOSIS — D801 Nonfamilial hypogammaglobulinemia: Secondary | ICD-10-CM

## 2018-12-20 DIAGNOSIS — C9110 Chronic lymphocytic leukemia of B-cell type not having achieved remission: Secondary | ICD-10-CM

## 2018-12-20 DIAGNOSIS — R0602 Shortness of breath: Secondary | ICD-10-CM

## 2018-12-20 DIAGNOSIS — D696 Thrombocytopenia, unspecified: Secondary | ICD-10-CM

## 2018-12-20 DIAGNOSIS — E039 Hypothyroidism, unspecified: Secondary | ICD-10-CM

## 2018-12-20 DIAGNOSIS — Z79899 Other long term (current) drug therapy: Secondary | ICD-10-CM | POA: Diagnosis not present

## 2018-12-20 LAB — CMP (CANCER CENTER ONLY)
ALT: 14 U/L (ref 0–44)
AST: 12 U/L — ABNORMAL LOW (ref 15–41)
Albumin: 4.3 g/dL (ref 3.5–5.0)
Alkaline Phosphatase: 111 U/L (ref 38–126)
Anion gap: 6 (ref 5–15)
BUN: 22 mg/dL — ABNORMAL HIGH (ref 6–20)
CO2: 27 mmol/L (ref 22–32)
Calcium: 9.6 mg/dL (ref 8.9–10.3)
Chloride: 108 mmol/L (ref 98–111)
Creatinine: 0.81 mg/dL (ref 0.44–1.00)
GFR, Est AFR Am: >60 mL/min (ref >60)
GFR, Estimated: >60 mL/min (ref >60)
Glucose, Bld: 115 mg/dL — ABNORMAL HIGH (ref 70–99)
Potassium: 4.1 mmol/L (ref 3.5–5.1)
Sodium: 141 mmol/L (ref 135–145)
Total Bilirubin: 0.6 mg/dL (ref 0.3–1.2)
Total Protein: 6.3 g/dL — ABNORMAL LOW (ref 6.5–8.1)

## 2018-12-20 LAB — CBC WITH DIFFERENTIAL (CANCER CENTER ONLY)
Basophils Relative: 0 %
Eosinophils Relative: 0 %
HCT: 21.7 % — ABNORMAL LOW (ref 36.0–46.0)
Hemoglobin: 7.2 g/dL — ABNORMAL LOW (ref 12.0–15.0)
Lymphocytes Relative: 85 %
Lymphs Abs: 2.2 10*3/uL (ref 0.7–4.0)
MCH: 29.6 pg (ref 26.0–34.0)
MCHC: 33.2 g/dL (ref 30.0–36.0)
MCV: 89.3 fL (ref 80.0–100.0)
Monocytes Relative: 1 %
Neutro Abs: 0.3 10*3/uL — CL (ref 1.7–7.7)
Neutrophils Relative %: 13 %
Platelet Count: 5 10*3/uL — CL (ref 150–400)
RBC: 2.43 MIL/uL — ABNORMAL LOW (ref 3.87–5.11)
RDW: 12.4 % (ref 11.5–15.5)
WBC Count: 2.5 10*3/uL — ABNORMAL LOW (ref 4.0–10.5)
nRBC: 0 % (ref 0.0–0.2)

## 2018-12-20 LAB — SAMPLE TO BLOOD BANK

## 2018-12-20 LAB — PREPARE RBC (CROSSMATCH)

## 2018-12-20 MED ORDER — SODIUM CHLORIDE 0.9% FLUSH
10.0000 mL | INTRAVENOUS | Status: AC | PRN
  Administered 2018-12-20: 16:00:00 10 mL
  Filled 2018-12-20: qty 10

## 2018-12-20 MED ORDER — TBO-FILGRASTIM 300 MCG/0.5ML ~~LOC~~ SOSY
PREFILLED_SYRINGE | SUBCUTANEOUS | Status: AC
  Filled 2018-12-20: qty 0.5

## 2018-12-20 MED ORDER — HEPARIN SOD (PORK) LOCK FLUSH 100 UNIT/ML IV SOLN
500.0000 [IU] | Freq: Once | INTRAVENOUS | Status: DC | PRN
  Filled 2018-12-20: qty 5

## 2018-12-20 MED ORDER — SODIUM CHLORIDE 0.9% FLUSH
10.0000 mL | INTRAVENOUS | Status: DC | PRN
  Filled 2018-12-20: qty 10

## 2018-12-20 MED ORDER — HEPARIN SOD (PORK) LOCK FLUSH 100 UNIT/ML IV SOLN
250.0000 [IU] | INTRAVENOUS | Status: AC | PRN
  Administered 2018-12-20: 250 [IU]
  Filled 2018-12-20: qty 5

## 2018-12-20 MED ORDER — TBO-FILGRASTIM 300 MCG/0.5ML ~~LOC~~ SOSY
300.0000 ug | PREFILLED_SYRINGE | Freq: Once | SUBCUTANEOUS | Status: AC
  Administered 2018-12-20: 14:00:00 300 ug via SUBCUTANEOUS

## 2018-12-20 MED ORDER — SODIUM CHLORIDE 0.9% IV SOLUTION
250.0000 mL | Freq: Once | INTRAVENOUS | Status: AC
  Administered 2018-12-20: 11:00:00 250 mL via INTRAVENOUS
  Filled 2018-12-20: qty 250

## 2018-12-20 NOTE — Telephone Encounter (Signed)
Received call report from Pam [facility].  "Today's ANC = 0.3."  Secure chat sent with results.  Read confirmation received at 1022 am.

## 2018-12-20 NOTE — Addendum Note (Signed)
Addended by: Owens Shark on: 12/20/2018 04:12 PM   Modules accepted: Orders, SmartSet

## 2018-12-20 NOTE — Patient Instructions (Signed)
Blood Transfusion, Adult, Care After  This sheet gives you information about how to care for yourself after your procedure. Your health care provider may also give you more specific instructions. If you have problems or questions, contact your health care provider.  What can I expect after the procedure?  After your procedure, it is common to have:   Bruising and soreness where the IV tube was inserted.   Headache.  Follow these instructions at home:     Take over-the-counter and prescription medicines only as told by your health care provider.   Return to your normal activities as told by your health care provider.   Follow instructions from your health care provider about how to take care of your IV insertion site. Make sure you:  ? Wash your hands with soap and water before you change your bandage (dressing). If soap and water are not available, use hand sanitizer.  ? Change your dressing as told by your health care provider.   Check your IV insertion site every day for signs of infection. Check for:  ? More redness, swelling, or pain.  ? More fluid or blood.  ? Warmth.  ? Pus or a bad smell.  Contact a health care provider if:   You have more redness, swelling, or pain around the IV insertion site.   You have more fluid or blood coming from the IV insertion site.   Your IV insertion site feels warm to the touch.   You have pus or a bad smell coming from the IV insertion site.   Your urine turns pink, red, or brown.   You feel weak after doing your normal activities.  Get help right away if:   You have signs of a serious allergic or immune system reaction, including:  ? Itchiness.  ? Hives.  ? Trouble breathing.  ? Anxiety.  ? Chest or lower back pain.  ? Fever, flushing, and chills.  ? Rapid pulse.  ? Rash.  ? Diarrhea.  ? Vomiting.  ? Dark urine.  ? Serious headache.  ? Dizziness.  ? Stiff neck.  ? Yellow coloration of the face or the white parts of the eyes (jaundice).  This information is not  intended to replace advice given to you by your health care provider. Make sure you discuss any questions you have with your health care provider.  Document Released: 07/31/2014 Document Revised: 03/08/2016 Document Reviewed: 01/24/2016  Elsevier Interactive Patient Education  2019 Elsevier Inc.

## 2018-12-20 NOTE — Progress Notes (Signed)
Pt to receive 2nd unit of blood tomorrow 12/21/18 per [place] in blood bank no H&H needed.

## 2018-12-20 NOTE — Progress Notes (Addendum)
[facility] OFFICE PROGRESS NOTE   Diagnosis: CLL  INTERVAL HISTORY:   Ms. Tamburri returns as scheduled.  She continues daily G-CSF.  She continues weekly Nplate, last injection 12/17/2018.  She was last transfused blood 12/13/2018 and platelets 12/14/2018.  She overall feels well.  She notes she is "winded" with minimal exertion.  No chest pain.  No bleeding.  No fever.  Objective:  Vital signs in last 24 hours:  Blood pressure 117/60, pulse 84, temperature 98.5 F (36.9 C), temperature source Oral, resp. rate 18, height _0  (1.676 m), weight 130 lb 14.4 oz (59.4 kg), SpO2 100 %.    Lymphatics: Tiny right medial inguinal lymph node.  No palpable cervical, supraclavicular, axillary or left inguinal lymph nodes.  Resp: Lungs clear bilaterally.  No respiratory distress. Cardio: Regular rate and rhythm. GI: Abdomen soft and nontender.  No hepatosplenomegaly. Vascular: No leg edema. Neuro: Alert and oriented. Skin: No rash. Left upper extremity PICC without erythema.   Lab Results:  Lab Results  Component Value Date   WBC 2.5 (L) 12/20/2018   HGB 7.2 (L) 12/20/2018   HCT 21.7 (L) 12/20/2018   MCV 89.3 12/20/2018   PLT 5 (LL) 12/20/2018   NEUTROABS PENDING 12/20/2018    Imaging:  No results found.  Medications: I have reviewed the patient's current medications.  Assessment/Plan:  1. 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 Mild spinal megaly.  Ultrasound-guided biopsy of the left inguinal lymph node 09/18/2017, slightly "purulent "fluid aspirated, core biopsy is consistent with an atypical lymphoid proliferation-extensive necrosis with surrounding epithelioid histiocytes,  limited intact lymphoid tissue involved with CLL  Incisional biopsy of a necrotic/purulent left inguinal lymph node on 10/01/2017-extensive necrosis with granulomatous inflammation, small amount of viable lymphoid tissue involved with CLL, AFB and fungal stains negative  Peripheral blood FISH analysis 02/05/2018- deletion 13q14, no evidence of p53 (17p13) deletion, no evidence of 11q22deletion  Bone marrow biopsy 02/26/2018-hypercellular marrow with extensive involvement by CLL, lymphocytes represent85% of all cells  Ibrutinib initiated 04/03/2018  Ibrutinib placed on hold 04/11/2018 due to onset of arthralgias  Ibrutinib resumed 04/16/2018, discontinued 04/25/2018 secondary to severe arthralgias/arthritis  Ibrutinib resumed at a dose of 140 mg daily 05/03/2018  Ibrutinib dose adjusted to 140 mg alternating with 283m 06/25/2018  Ibrutinib discontinued 07/03/2018 secondary to severe arthralgias  Acalabrutinib 08/16/2018, discontinued 11/15/2018 secondary to persistent severe transfusion dependent anemia and neutropenia/thrombocytopenia, last dose 11/14/2018  Bone marrow biopsy 11/21/2018- decreased cellularity, involvement by CLL, decreased erythroid and granulocytic precursors, decreased megakaryocytes  Cycle 1 rituximab 12/06/2018  2.Hypothyroidism 3.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 Augmentin prescribed. 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  Pancytopenia secondary to CLL and a hypocellular bone marrow  G-CSF and Nplate started 53/02/6577 G-CSF changed to daily beginning 12/17/2018     Disposition: Ms. NGingerichappears unchanged.  She has persistent severe pancytopenia.  She will receive red cells and platelets today.  Plan to continue daily G-CSF and weekly Nplate.  She has an appointment with Dr.  Coombs at [facility] 12/24/2018.  She will continue lab and PICC care on a Monday Wednesday Friday schedule here.  We will see her in follow-up on 12/27/2018.  She will contact the office in the interim with any problems.  We specifically discussed fever, chills, other signs of infection, bleeding.  Patient seen with Dr. Benay Spice.    Ned Card ANP/GNP-BC   12/20/2018  9:48 AM This was a shared visit with Ned Card.  Ms. Sheperd has persistent severe pancytopenia.  I discussed treatment plans with [name].  I discussed the treatment history in detail with her husband by telephone. The plan is to continue weekly Nplate and daily G-CSF.  She will be transfused with platelets and packed red blood cells today.  We will continue transfusion support as needed.  They understand we are hoping for hematologic recovery over the next few months.  She will see Dr. Prince Solian at [facility] next week.  She will return for an office visit in 1 week.  [name], MD

## 2018-12-21 ENCOUNTER — Inpatient Hospital Stay: Payer: BC Managed Care – PPO

## 2018-12-21 VITALS — BP 104/65 | HR 72 | Temp 98.4°F | Resp 18

## 2018-12-21 DIAGNOSIS — Z79899 Other long term (current) drug therapy: Secondary | ICD-10-CM | POA: Diagnosis not present

## 2018-12-21 DIAGNOSIS — C9110 Chronic lymphocytic leukemia of B-cell type not having achieved remission: Secondary | ICD-10-CM | POA: Diagnosis not present

## 2018-12-21 DIAGNOSIS — D801 Nonfamilial hypogammaglobulinemia: Secondary | ICD-10-CM | POA: Diagnosis not present

## 2018-12-21 DIAGNOSIS — D61818 Other pancytopenia: Secondary | ICD-10-CM | POA: Diagnosis not present

## 2018-12-21 DIAGNOSIS — Z8781 Personal history of (healed) traumatic fracture: Secondary | ICD-10-CM | POA: Diagnosis not present

## 2018-12-21 DIAGNOSIS — E039 Hypothyroidism, unspecified: Secondary | ICD-10-CM | POA: Diagnosis not present

## 2018-12-21 DIAGNOSIS — D696 Thrombocytopenia, unspecified: Secondary | ICD-10-CM

## 2018-12-21 DIAGNOSIS — R0602 Shortness of breath: Secondary | ICD-10-CM | POA: Diagnosis not present

## 2018-12-21 DIAGNOSIS — Z9221 Personal history of antineoplastic chemotherapy: Secondary | ICD-10-CM | POA: Diagnosis not present

## 2018-12-21 MED ORDER — SODIUM CHLORIDE 0.9% IV SOLUTION
250.0000 mL | Freq: Once | INTRAVENOUS | Status: AC
  Administered 2018-12-21: 250 mL via INTRAVENOUS
  Filled 2018-12-21: qty 250

## 2018-12-21 MED ORDER — HEPARIN SOD (PORK) LOCK FLUSH 100 UNIT/ML IV SOLN
250.0000 [IU] | INTRAVENOUS | Status: AC | PRN
  Administered 2018-12-21: 250 [IU]
  Filled 2018-12-21: qty 5

## 2018-12-21 MED ORDER — SODIUM CHLORIDE 0.9% FLUSH
10.0000 mL | INTRAVENOUS | Status: AC | PRN
  Administered 2018-12-21: 11:00:00 10 mL
  Filled 2018-12-21: qty 10

## 2018-12-21 MED ORDER — TBO-FILGRASTIM 300 MCG/0.5ML ~~LOC~~ SOSY
300.0000 ug | PREFILLED_SYRINGE | Freq: Once | SUBCUTANEOUS | Status: AC
  Administered 2018-12-21: 300 ug via SUBCUTANEOUS

## 2018-12-21 NOTE — Patient Instructions (Signed)
Tbo-Filgrastim injection What is this medicine? TBO-FILGRASTIM (T B O fil GRA stim) is a granulocyte colony-stimulating factor that stimulates the growth of neutrophils, a type of white blood cell important in the body's fight against infection. It is used to reduce the incidence of fever and infection in patients with certain types of cancer who are receiving chemotherapy that affects the bone marrow. This medicine may be used for other purposes; ask your health care provider or pharmacist if you have questions. COMMON BRAND NAME(S): Granix What should I tell my health care provider before I take this medicine? They need to know if you have any of these conditions: -bone scan or tests planned -kidney disease -sickle cell anemia -an unusual or allergic reaction to tbo-filgrastim, filgrastim, pegfilgrastim, other medicines, foods, dyes, or preservatives -pregnant or trying to get pregnant -breast-feeding How should I use this medicine? This medicine is for injection under the skin. If you get this medicine at home, you will be taught how to prepare and give this medicine. Refer to the Instructions for Use that come with your medication packaging. Use exactly as directed. Take your medicine at regular intervals. Do not take your medicine more often than directed. It is important that you put your used needles and syringes in a special sharps container. Do not put them in a trash can. If you do not have a sharps container, call your pharmacist or healthcare provider to get one. Talk to your pediatrician regarding the use of this medicine in children. While this drug may be prescribed for children as young as 1 month of age for selected conditions, precautions do apply. Overdosage: If you think you have taken too much of this medicine contact a poison control center or emergency room at once. NOTE: This medicine is only for you. Do not share this medicine with others. What if I miss a dose? It is  important not to miss your dose. Call your doctor or health care professional if you miss a dose. What may interact with this medicine? This medicine may interact with the following medications: -medicines that may cause a release of neutrophils, such as lithium This list may not describe all possible interactions. Give your health care provider a list of all the medicines, herbs, non-prescription drugs, or dietary supplements you use. Also tell them if you smoke, drink alcohol, or use illegal drugs. Some items may interact with your medicine. What should I watch for while using this medicine? You may need blood work done while you are taking this medicine. What side effects may I notice from receiving this medicine? Side effects that you should report to your doctor or health care professional as soon as possible: -allergic reactions like skin rash, itching or hives, swelling of the face, lips, or tongue -back pain -blood in the urine -dark urine -dizziness -fast heartbeat -feeling faint -shortness of breath or breathing problems -signs and symptoms of infection like fever or chills; cough; or sore throat -signs and symptoms of kidney injury like trouble passing urine or change in the amount of urine -stomach or side pain, or pain at the shoulder -sweating -swelling of the legs, ankles, or abdomen -tiredness Side effects that usually do not require medical attention (report to your doctor or health care professional if they continue or are bothersome): -bone pain -diarrhea -headache -muscle pain -vomiting This list may not describe all possible side effects. Call your doctor for medical advice about side effects. You may report side effects to FDA at   1-800-FDA-1088. Where should I keep my medicine? Keep out of the reach of children. Store in a refrigerator between 2 and 8 degrees C (36 and 46 degrees F). Keep in carton to protect from light. Throw away this medicine if it is left out  of the refrigerator for more than 5 consecutive days. Throw away any unused medicine after the expiration date. NOTE: This sheet is a summary. It may not cover all possible information. If you have questions about this medicine, talk to your doctor, pharmacist, or health care provider.  2019 Elsevier/Gold Standard (2017-02-27 16:56:18)  

## 2018-12-21 NOTE — Progress Notes (Signed)
See other visit for notes

## 2018-12-22 LAB — BPAM PLATELET PHERESIS
Blood Product Expiration Date: 202005292359
ISSUE DATE / TIME: 202005291113
Unit Type and Rh: 5100

## 2018-12-22 LAB — TYPE AND SCREEN
ABO/RH(D): O POS
Antibody Screen: NEGATIVE
Unit division: 0
Unit division: 0

## 2018-12-22 LAB — PREPARE PLATELET PHERESIS: Unit division: 0

## 2018-12-22 LAB — BPAM RBC
Blood Product Expiration Date: 202006232359
Blood Product Expiration Date: 202006232359
ISSUE DATE / TIME: 202005291357
ISSUE DATE / TIME: 202005300836
Unit Type and Rh: 5100
Unit Type and Rh: 5100

## 2018-12-23 ENCOUNTER — Inpatient Hospital Stay: Payer: BC Managed Care – PPO

## 2018-12-23 ENCOUNTER — Other Ambulatory Visit: Payer: Self-pay | Admitting: Psychiatry

## 2018-12-23 ENCOUNTER — Other Ambulatory Visit: Payer: Self-pay | Admitting: *Deleted

## 2018-12-23 ENCOUNTER — Telehealth: Payer: Self-pay | Admitting: Psychiatry

## 2018-12-23 ENCOUNTER — Telehealth: Payer: Self-pay | Admitting: *Deleted

## 2018-12-23 ENCOUNTER — Other Ambulatory Visit: Payer: Self-pay

## 2018-12-23 ENCOUNTER — Other Ambulatory Visit: Payer: Self-pay | Admitting: Oncology

## 2018-12-23 ENCOUNTER — Inpatient Hospital Stay: Payer: BC Managed Care – PPO | Attending: Oncology

## 2018-12-23 ENCOUNTER — Telehealth: Payer: Self-pay

## 2018-12-23 DIAGNOSIS — M542 Cervicalgia: Secondary | ICD-10-CM | POA: Insufficient documentation

## 2018-12-23 DIAGNOSIS — E039 Hypothyroidism, unspecified: Secondary | ICD-10-CM | POA: Insufficient documentation

## 2018-12-23 DIAGNOSIS — D801 Nonfamilial hypogammaglobulinemia: Secondary | ICD-10-CM | POA: Insufficient documentation

## 2018-12-23 DIAGNOSIS — R161 Splenomegaly, not elsewhere classified: Secondary | ICD-10-CM | POA: Insufficient documentation

## 2018-12-23 DIAGNOSIS — C9110 Chronic lymphocytic leukemia of B-cell type not having achieved remission: Secondary | ICD-10-CM | POA: Insufficient documentation

## 2018-12-23 DIAGNOSIS — R0602 Shortness of breath: Secondary | ICD-10-CM | POA: Insufficient documentation

## 2018-12-23 DIAGNOSIS — J189 Pneumonia, unspecified organism: Secondary | ICD-10-CM | POA: Diagnosis not present

## 2018-12-23 DIAGNOSIS — D696 Thrombocytopenia, unspecified: Secondary | ICD-10-CM

## 2018-12-23 DIAGNOSIS — D61818 Other pancytopenia: Secondary | ICD-10-CM | POA: Insufficient documentation

## 2018-12-23 DIAGNOSIS — F329 Major depressive disorder, single episode, unspecified: Secondary | ICD-10-CM | POA: Diagnosis not present

## 2018-12-23 DIAGNOSIS — G4700 Insomnia, unspecified: Secondary | ICD-10-CM | POA: Insufficient documentation

## 2018-12-23 DIAGNOSIS — Z79899 Other long term (current) drug therapy: Secondary | ICD-10-CM | POA: Diagnosis not present

## 2018-12-23 DIAGNOSIS — N6331 Unspecified lump in axillary tail of the right breast: Secondary | ICD-10-CM | POA: Diagnosis not present

## 2018-12-23 DIAGNOSIS — I341 Nonrheumatic mitral (valve) prolapse: Secondary | ICD-10-CM | POA: Insufficient documentation

## 2018-12-23 LAB — CBC WITH DIFFERENTIAL (CANCER CENTER ONLY)
Abs Immature Granulocytes: 0 10*3/uL (ref 0.00–0.07)
Basophils Absolute: 0 10*3/uL (ref 0.0–0.1)
Basophils Relative: 0 %
Eosinophils Absolute: 0 10*3/uL (ref 0.0–0.5)
Eosinophils Relative: 0 %
HCT: 26.9 % — ABNORMAL LOW (ref 36.0–46.0)
Hemoglobin: 8.9 g/dL — ABNORMAL LOW (ref 12.0–15.0)
Immature Granulocytes: 0 %
Lymphocytes Relative: 89 %
Lymphs Abs: 2.1 10*3/uL (ref 0.7–4.0)
MCH: 29.3 pg (ref 26.0–34.0)
MCHC: 33.1 g/dL (ref 30.0–36.0)
MCV: 88.5 fL (ref 80.0–100.0)
Monocytes Absolute: 0 10*3/uL — ABNORMAL LOW (ref 0.1–1.0)
Monocytes Relative: 2 %
Neutro Abs: 0.2 10*3/uL — CL (ref 1.7–7.7)
Neutrophils Relative %: 9 %
Platelet Count: 8 10*3/uL — CL (ref 150–400)
RBC: 3.04 MIL/uL — ABNORMAL LOW (ref 3.87–5.11)
RDW: 13 % (ref 11.5–15.5)
WBC Count: 2.3 10*3/uL — ABNORMAL LOW (ref 4.0–10.5)
nRBC: 0 % (ref 0.0–0.2)

## 2018-12-23 LAB — SAMPLE TO BLOOD BANK

## 2018-12-23 MED ORDER — HEPARIN SOD (PORK) LOCK FLUSH 100 UNIT/ML IV SOLN
500.0000 [IU] | Freq: Once | INTRAVENOUS | Status: DC | PRN
  Filled 2018-12-23: qty 5

## 2018-12-23 MED ORDER — SODIUM CHLORIDE 0.9% FLUSH
10.0000 mL | INTRAVENOUS | Status: DC | PRN
  Filled 2018-12-23: qty 10

## 2018-12-23 MED ORDER — TBO-FILGRASTIM 300 MCG/0.5ML ~~LOC~~ SOSY
300.0000 ug | PREFILLED_SYRINGE | Freq: Once | SUBCUTANEOUS | Status: AC
  Administered 2018-12-23: 300 ug via SUBCUTANEOUS

## 2018-12-23 MED ORDER — ROMIPLOSTIM 250 MCG ~~LOC~~ SOLR
1.0000 ug/kg | Freq: Once | SUBCUTANEOUS | Status: AC
  Administered 2018-12-23: 60 ug via SUBCUTANEOUS
  Filled 2018-12-23: qty 0.12

## 2018-12-23 MED ORDER — TBO-FILGRASTIM 300 MCG/0.5ML ~~LOC~~ SOSY
PREFILLED_SYRINGE | SUBCUTANEOUS | Status: AC
  Filled 2018-12-23: qty 0.5

## 2018-12-23 NOTE — Telephone Encounter (Signed)
TC from lab to report abnormal lab. HGB 8.9. Dr. Benay Spice aware

## 2018-12-23 NOTE — Progress Notes (Signed)
Ok to give N-plate today per Dr. Benay Spice.

## 2018-12-23 NOTE — Telephone Encounter (Signed)
Received call report from [facility].  "Today's Pltc = 8."  Secure chat sent with results.

## 2018-12-23 NOTE — Progress Notes (Signed)
Per Dr. Benay Spice to keep Nplate dose at 3mcg/kg today.  Jalene Mullet, PharmD PGY2 Hematology/ Oncology Pharmacy Resident 12/23/2018 10:18 AM

## 2018-12-23 NOTE — Telephone Encounter (Signed)
Received TC with  Abnormal lab. (ANC 0.2) Dr Benay Spice aware.

## 2018-12-23 NOTE — Patient Instructions (Signed)
Tbo-Filgrastim injection What is this medicine? TBO-FILGRASTIM (T B O fil GRA stim) is a granulocyte colony-stimulating factor that stimulates the growth of neutrophils, a type of white blood cell important in the body's fight against infection. It is used to reduce the incidence of fever and infection in patients with certain types of cancer who are receiving chemotherapy that affects the bone marrow. This medicine may be used for other purposes; ask your health care provider or pharmacist if you have questions. COMMON BRAND NAME(S): Granix What should I tell my health care provider before I take this medicine? They need to know if you have any of these conditions: -bone scan or tests planned -kidney disease -sickle cell anemia -an unusual or allergic reaction to tbo-filgrastim, filgrastim, pegfilgrastim, other medicines, foods, dyes, or preservatives -pregnant or trying to get pregnant -breast-feeding How should I use this medicine? This medicine is for injection under the skin. If you get this medicine at home, you will be taught how to prepare and give this medicine. Refer to the Instructions for Use that come with your medication packaging. Use exactly as directed. Take your medicine at regular intervals. Do not take your medicine more often than directed. It is important that you put your used needles and syringes in a special sharps container. Do not put them in a trash can. If you do not have a sharps container, call your pharmacist or healthcare provider to get one. Talk to your pediatrician regarding the use of this medicine in children. While this drug may be prescribed for children as young as 1 month of age for selected conditions, precautions do apply. Overdosage: If you think you have taken too much of this medicine contact a poison control center or emergency room at once. NOTE: This medicine is only for you. Do not share this medicine with others. What if I miss a dose? It is  important not to miss your dose. Call your doctor or health care professional if you miss a dose. What may interact with this medicine? This medicine may interact with the following medications: -medicines that may cause a release of neutrophils, such as lithium This list may not describe all possible interactions. Give your health care provider a list of all the medicines, herbs, non-prescription drugs, or dietary supplements you use. Also tell them if you smoke, drink alcohol, or use illegal drugs. Some items may interact with your medicine. What should I watch for while using this medicine? You may need blood work done while you are taking this medicine. What side effects may I notice from receiving this medicine? Side effects that you should report to your doctor or health care professional as soon as possible: -allergic reactions like skin rash, itching or hives, swelling of the face, lips, or tongue -back pain -blood in the urine -dark urine -dizziness -fast heartbeat -feeling faint -shortness of breath or breathing problems -signs and symptoms of infection like fever or chills; cough; or sore throat -signs and symptoms of kidney injury like trouble passing urine or change in the amount of urine -stomach or side pain, or pain at the shoulder -sweating -swelling of the legs, ankles, or abdomen -tiredness Side effects that usually do not require medical attention (report to your doctor or health care professional if they continue or are bothersome): -bone pain -diarrhea -headache -muscle pain -vomiting This list may not describe all possible side effects. Call your doctor for medical advice about side effects. You may report side effects to FDA at   1-800-FDA-1088. Where should I keep my medicine? Keep out of the reach of children. Store in a refrigerator between 2 and 8 degrees C (36 and 46 degrees F). Keep in carton to protect from light. Throw away this medicine if it is left out  of the refrigerator for more than 5 consecutive days. Throw away any unused medicine after the expiration date. NOTE: This sheet is a summary. It may not cover all possible information. If you have questions about this medicine, talk to your doctor, pharmacist, or health care provider.  2019 Elsevier/Gold Standard (2017-02-27 16:56:18)  

## 2018-12-23 NOTE — Telephone Encounter (Signed)
TC  Oncologist Dr. Benay Spice re: Meagan Walsh  CLL  Transfusion dependent anemia.  New chemo tolerated and well tolerated.  Severe pancytopenia.    They are looking for other potential causes for this problem.  They were wondering if perhaps psychiatric medicines could contribute.  There is no evidence that lithium nor the antidepressant Trintellix would cause this kind of a problem.  Lamotrigine is in the family with other antiepileptics that certainly have a risk of causing this type of problem though it is extremely rare with lamotrigine.  However the lamotrigine is probably the least important of her current psychiatric medications and therefore we will have a discussion with the patient to wean this medication as soon as possible to see if her blood counts improve.  The likelihood is that the pancytopenia is related to the chemotherapy agents but they want to rule out other possible causes if possible.  Agreed.  I will contact the patient about this.  Telephone call to the patient.  Discussed the details from above.  Dr. Tyna Jaksch would like her to get off the medication as soon as possible.  We discussed the seizure risk if she stops too quickly as well as other withdrawal symptoms.  We will decrease by 25% every 5 days in order to accomplish this as quickly as possible.  If she experiences any unusual edginess tremor, jitteriness, anxiety or any other unusual symptoms she will contact us and we may consider supplementing with clonazepam or perhaps slowing the taper.  However, because of the possible though unlikely, chance that lamotrigine could be suppressing her bone marrow it is imperative that we wean her off this as soon as medically possible.  She agrees with the plan.  Hiram Comber, MD, DFAPA

## 2018-12-23 NOTE — Progress Notes (Signed)
She has persistent severe pancytopenia requiring platelet and red cell transfusions.  I reviewed her medications, none are commonly associated with hematologic toxicity.  There are uncommon reports of hematologic toxicity with Lamictal. I contacted Dr. Clovis Pu.  He indicates he first saw Meagan Walsh in January of 2018 and she was already on Lamictal and Trenttellix.  She continued to have depression in January 2018 and he placed her on lithium.  He reports the depression resolved within 2 weeks of beginning lithium.  The depression remains improved.   She began to develop cytopenias in the summer of 2019.  It is unlikely, but possible the lamictal is contributing. Dr. Clovis Pu will taper Meagan Walsh off of the lamictal as soon as possible.  Meagan Walsh did not wish to receive a platelet transfusion today.  She will return to [facility] for an appointment and bone marrow biopsy on 12/24/2018.

## 2018-12-24 ENCOUNTER — Ambulatory Visit: Admit: 2018-12-24 | Discharge: 2018-12-24 | Payer: PRIVATE HEALTH INSURANCE

## 2018-12-24 ENCOUNTER — Encounter: Admit: 2018-12-24 | Discharge: 2018-12-24 | Payer: PRIVATE HEALTH INSURANCE

## 2018-12-24 ENCOUNTER — Ambulatory Visit
Admit: 2018-12-24 | Discharge: 2018-12-24 | Payer: PRIVATE HEALTH INSURANCE | Attending: Hematology & Oncology | Primary: Hematology & Oncology

## 2018-12-24 ENCOUNTER — Other Ambulatory Visit: Payer: Self-pay | Admitting: *Deleted

## 2018-12-24 ENCOUNTER — Inpatient Hospital Stay: Payer: BC Managed Care – PPO

## 2018-12-24 DIAGNOSIS — Z79899 Other long term (current) drug therapy: Secondary | ICD-10-CM | POA: Diagnosis not present

## 2018-12-24 DIAGNOSIS — C9110 Chronic lymphocytic leukemia of B-cell type not having achieved remission: Secondary | ICD-10-CM | POA: Diagnosis not present

## 2018-12-24 DIAGNOSIS — D801 Nonfamilial hypogammaglobulinemia: Secondary | ICD-10-CM | POA: Diagnosis not present

## 2018-12-24 DIAGNOSIS — D61818 Other pancytopenia: Secondary | ICD-10-CM | POA: Diagnosis not present

## 2018-12-24 DIAGNOSIS — E039 Hypothyroidism, unspecified: Secondary | ICD-10-CM | POA: Diagnosis not present

## 2018-12-24 DIAGNOSIS — Z6821 Body mass index (BMI) 21.0-21.9, adult: Secondary | ICD-10-CM | POA: Diagnosis not present

## 2018-12-24 LAB — HEPATITIS B DNA, ULTRAQUANTITATIVE, PCR
HBV DNA SERPL PCR-ACNC: NOT DETECTED IU/mL
HBV DNA SERPL PCR-LOG IU: UNDETERMINED log10 IU/mL

## 2018-12-24 MED ORDER — AMINOCAPROIC ACID 500 MG PO TABS: 1000.0000 mg | ORAL_TABLET | Freq: Three times a day (TID) | ORAL | 1 refills | Status: DC

## 2018-12-25 ENCOUNTER — Other Ambulatory Visit: Payer: Self-pay | Admitting: *Deleted

## 2018-12-25 ENCOUNTER — Inpatient Hospital Stay: Payer: BC Managed Care – PPO

## 2018-12-25 ENCOUNTER — Other Ambulatory Visit: Payer: Self-pay | Admitting: Oncology

## 2018-12-25 ENCOUNTER — Other Ambulatory Visit: Payer: Self-pay

## 2018-12-25 VITALS — BP 104/52 | HR 84 | Temp 98.3°F | Resp 18

## 2018-12-25 DIAGNOSIS — N6331 Unspecified lump in axillary tail of the right breast: Secondary | ICD-10-CM | POA: Diagnosis not present

## 2018-12-25 DIAGNOSIS — C9110 Chronic lymphocytic leukemia of B-cell type not having achieved remission: Secondary | ICD-10-CM

## 2018-12-25 DIAGNOSIS — Z79899 Other long term (current) drug therapy: Secondary | ICD-10-CM | POA: Diagnosis not present

## 2018-12-25 DIAGNOSIS — E039 Hypothyroidism, unspecified: Secondary | ICD-10-CM | POA: Diagnosis not present

## 2018-12-25 DIAGNOSIS — I341 Nonrheumatic mitral (valve) prolapse: Secondary | ICD-10-CM | POA: Diagnosis not present

## 2018-12-25 DIAGNOSIS — D801 Nonfamilial hypogammaglobulinemia: Secondary | ICD-10-CM | POA: Diagnosis not present

## 2018-12-25 DIAGNOSIS — M542 Cervicalgia: Secondary | ICD-10-CM | POA: Diagnosis not present

## 2018-12-25 DIAGNOSIS — G4700 Insomnia, unspecified: Secondary | ICD-10-CM | POA: Diagnosis not present

## 2018-12-25 DIAGNOSIS — D696 Thrombocytopenia, unspecified: Secondary | ICD-10-CM

## 2018-12-25 DIAGNOSIS — D61818 Other pancytopenia: Secondary | ICD-10-CM | POA: Diagnosis not present

## 2018-12-25 DIAGNOSIS — F329 Major depressive disorder, single episode, unspecified: Secondary | ICD-10-CM | POA: Diagnosis not present

## 2018-12-25 DIAGNOSIS — D649 Anemia, unspecified: Secondary | ICD-10-CM

## 2018-12-25 DIAGNOSIS — R0602 Shortness of breath: Secondary | ICD-10-CM | POA: Diagnosis not present

## 2018-12-25 DIAGNOSIS — J189 Pneumonia, unspecified organism: Secondary | ICD-10-CM | POA: Diagnosis not present

## 2018-12-25 DIAGNOSIS — R161 Splenomegaly, not elsewhere classified: Secondary | ICD-10-CM | POA: Diagnosis not present

## 2018-12-25 LAB — CBC WITH DIFFERENTIAL (CANCER CENTER ONLY)
Abs Immature Granulocytes: 0 10*3/uL (ref 0.00–0.07)
Basophils Absolute: 0 10*3/uL (ref 0.0–0.1)
Basophils Relative: 0 %
Eosinophils Absolute: 0 10*3/uL (ref 0.0–0.5)
Eosinophils Relative: 0 %
HCT: 24.6 % — ABNORMAL LOW (ref 36.0–46.0)
Hemoglobin: 8.1 g/dL — ABNORMAL LOW (ref 12.0–15.0)
Immature Granulocytes: 0 %
Lymphocytes Relative: 84 %
Lymphs Abs: 1.3 10*3/uL (ref 0.7–4.0)
MCH: 28.8 pg (ref 26.0–34.0)
MCHC: 32.9 g/dL (ref 30.0–36.0)
MCV: 87.5 fL (ref 80.0–100.0)
Monocytes Absolute: 0 10*3/uL — ABNORMAL LOW (ref 0.1–1.0)
Monocytes Relative: 3 %
Neutro Abs: 0.2 10*3/uL — CL (ref 1.7–7.7)
Neutrophils Relative %: 13 %
Platelet Count: 8 10*3/uL — CL (ref 150–400)
RBC: 2.81 MIL/uL — ABNORMAL LOW (ref 3.87–5.11)
RDW: 12.3 % (ref 11.5–15.5)
WBC Count: 1.6 10*3/uL — ABNORMAL LOW (ref 4.0–10.5)
nRBC: 0 % (ref 0.0–0.2)

## 2018-12-25 LAB — SAMPLE TO BLOOD BANK

## 2018-12-25 LAB — TYPE AND SCREEN
ABO/RH(D): O POS
Antibody Screen: NEGATIVE

## 2018-12-25 MED ORDER — SODIUM CHLORIDE 0.9% FLUSH
10.0000 mL | INTRAVENOUS | Status: DC | PRN
  Administered 2018-12-25: 10 mL
  Filled 2018-12-25: qty 10

## 2018-12-25 MED ORDER — TBO-FILGRASTIM 300 MCG/0.5ML ~~LOC~~ SOSY
300.0000 ug | PREFILLED_SYRINGE | Freq: Once | SUBCUTANEOUS | Status: AC
  Administered 2018-12-25: 300 ug via SUBCUTANEOUS

## 2018-12-25 MED ORDER — SODIUM CHLORIDE 0.9% FLUSH
10.0000 mL | INTRAVENOUS | Status: AC | PRN
  Administered 2018-12-25: 16:00:00 10 mL
  Filled 2018-12-25: qty 10

## 2018-12-25 MED ORDER — TBO-FILGRASTIM 300 MCG/0.5ML ~~LOC~~ SOSY
PREFILLED_SYRINGE | SUBCUTANEOUS | Status: AC
  Filled 2018-12-25: qty 0.5

## 2018-12-25 MED ORDER — FLUCONAZOLE 100 MG PO TABS: 100.0000 mg | ORAL_TABLET | Freq: Every day | ORAL | 0 refills | Status: DC

## 2018-12-25 MED ORDER — SODIUM CHLORIDE 0.9% IV SOLUTION
250.0000 mL | Freq: Once | INTRAVENOUS | Status: DC
  Filled 2018-12-25: qty 250

## 2018-12-25 MED ORDER — HEPARIN SOD (PORK) LOCK FLUSH 100 UNIT/ML IV SOLN
250.0000 [IU] | INTRAVENOUS | Status: AC | PRN
  Administered 2018-12-25: 250 [IU]
  Filled 2018-12-25: qty 5

## 2018-12-25 MED ORDER — VALACYCLOVIR HCL 500 MG PO TABS: 500.0000 mg | ORAL_TABLET | Freq: Two times a day (BID) | ORAL | 0 refills | Status: DC

## 2018-12-25 MED ORDER — HEPARIN SOD (PORK) LOCK FLUSH 100 UNIT/ML IV SOLN
500.0000 [IU] | Freq: Once | INTRAVENOUS | Status: AC | PRN
  Administered 2018-12-25: 250 [IU]
  Filled 2018-12-25: qty 5

## 2018-12-25 MED ORDER — LEVOFLOXACIN 500 MG PO TABS: 500.0000 mg | ORAL_TABLET | Freq: Every day | ORAL | 0 refills | Status: DC

## 2018-12-25 NOTE — Patient Instructions (Addendum)
[facility] Discharge Instructions for Patients Receiving Chemotherapy    To help prevent nausea and vomiting after your treatment, we encourage you to take your medication as directed.  If you develop nausea and vomiting that is not controlled by your nausea medication, call the clinic.   BELOW ARE SYMPTOMS THAT SHOULD BE REPORTED IMMEDIATELY:  *FEVER GREATER THAN 100.5 F  *CHILLS WITH OR WITHOUT FEVER  NAUSEA AND VOMITING THAT IS NOT CONTROLLED WITH YOUR NAUSEA MEDICATION  *UNUSUAL SHORTNESS OF BREATH  *UNUSUAL BRUISING OR BLEEDING  TENDERNESS IN MOUTH AND THROAT WITH OR WITHOUT PRESENCE OF ULCERS  *URINARY PROBLEMS  *BOWEL PROBLEMS  UNUSUAL RASH Items with * indicate a potential emergency and should be followed up as soon as possible.  Feel free to call the clinic should you have any questions or concerns. The clinic phone number is [phone].  Please show the [facility] at check-in to the Emergency Department and triage nurse.    Provided printed and verbal instructions on use of Amicar, Diflucan,Valtrex and Levaquin and purpose. Provided handout on Amicar side effects and when to call MD.   Thrombocytopenia  Thrombocytopenia means that you have a low number of platelets in your blood. Platelets are tiny cells in the blood. When you bleed, they clump together at the cut or injury to stop the bleeding. This is called blood clotting. Not having enough platelets can cause bleeding problems. Follow these instructions at home: General instructions  Check your skin and inside your mouth for bruises or blood as told by your doctor.  Check to see if there is blood in your spit (sputum), pee (urine), and poop (stool). Do this as told by your doctor.  Ask your doctor if you can drink alcohol.  Take over-the-counter and prescription medicines only as told by your doctor.  Tell all of your doctors that you have this condition. Be sure to tell your  dentist and eye doctor too. Activity  Do not do activities that can cause bumps or bruises until your doctor says it is okay.  Be careful not to cut yourself: ? When you shave. ? When you use scissors, needles, knives, or other tools.  Be careful not to burn yourself: ? When you use an iron. ? When you cook. Contact a doctor if:  You have bruises and you do not know why. Get help right away if:  You are bleeding anywhere on your body.  You have blood in your spit, pee, or poop. This information is not intended to replace advice given to you by your health care provider. Make sure you discuss any questions you have with your health care provider. Document Released: 06/29/2011 Document Revised: 03/12/2016 Document Reviewed: 01/11/2015 Elsevier Interactive Patient Education  2019 Elsevier Inc.  Neutropenia Neutropenia is a condition that occurs when you have a lower-than-normal level of a type of white blood cell (neutrophil) in your body. Neutrophils are made in the spongy center of large bones (bone marrow) and they fight infections. Neutrophils are your body's main defense against bacterial and fungal infections. The fewer neutrophils you have and the longer your body remains without them, the greater your risk of getting a severe infection. What are the causes? This condition can occur if your body uses up or destroys neutrophils faster than your bone marrow can make them. This problem may happen because of:  Bacterial or fungal infection.  Allergic disorders.  Reactions to some medicines.  Autoimmune disease.  An  enlarged spleen. This condition can also occur if your bone marrow does not produce enough neutrophils. This problem may be caused by:  Cancer.  Cancer treatments, such as radiation or chemotherapy.  Viral infections.  Medicines, such as phenytoin.  Vitamin B12 deficiency.  Diseases of the bone marrow.  Environmental toxins, such as insecticides. What  are the signs or symptoms? This condition does not usually cause symptoms. If symptoms are present, they are usually caused by an underlying infection. Symptoms of an infection may include:  Fever.  Chills.  Swollen glands.  Oral or anal ulcers.  Cough and shortness of breath.  Rash.  Skin infection.  Fatigue. How is this diagnosed? Your health care provider may suspect neutropenia if you have:  A condition that may cause neutropenia.  Symptoms of infection, especially fever.  Frequent and unusual infections. You will have a medical history and physical exam. Tests will also be done, such as:  A complete blood count (CBC).  A procedure to collect a sample of bone marrow for examination (bone marrow biopsy).  A chest X-ray.  A urine culture.  A blood culture. How is this treated? Treatment depends on the underlying cause and severity of your condition. Mild neutropenia may not require treatment. Treatment may include medicines, such as:  Antibiotic medicine given through an IV tube.  Antiviral medicines.  Antifungal medicines.  A medicine to increase neutrophil production (colony-stimulating factor). You may get this drug through an IV tube or by injection.  Steroids given through an IV tube. If an underlying condition is causing neutropenia, you may need treatment for that condition. If medicines you are taking are causing neutropenia, your health care provider may have you stop taking those medicines. Follow these instructions at home: Medicines   Take over-the-counter and prescription medicines only as told by your health care provider.  Get a seasonal flu shot (influenza vaccine). Lifestyle  Do not eat unpasteurized foods.Do not eat unwashed raw fruits or vegetables.  Avoid exposure to groups of people or children.  Avoid being around people who are sick.  Avoid being around dirt or dust, such as in construction areas or gardens.  Do not provide  direct care for pets. Avoid animal droppings. Do not clean litter boxes and bird cages. Hygiene   Bathe daily.  Clean the area between the genitals and the anus (perineal area) after you urinate or have a bowel movement. If you are female, wipe from front to back.  Brush your teeth with a soft toothbrush before and after meals.  Do not use a razor that has a blade. Use an electric razor to remove hair.  Wash your hands often. Make sure others who come in contact with you also wash their hands. If soap and water are not available, use hand sanitizer. General instructions  Do not have sex unless your health care provider has approved.  Take actions to avoid cuts and burns. For example: ? Be cautious when you use knives. Always cut away from yourself. ? Keep knives in protective sheaths or guards when not in use. ? Use oven mitts when you cook with a hot stove, oven, or grill. ? Stand a safe distance away from open fires.  Avoid people who received a vaccine in the past 30 days if that vaccine contained a live version of the germ (live vaccine). You should not get a live vaccine. Common live vaccines are varicella, measles, mumps, and rubella.  Do not share food utensils.  Do  not use tampons, enemas, or rectal suppositories unless your health care provider has approved.  Keep all appointments as told by your health care provider. This is important. Contact a health care provider if:  You have a fever.  You have chills or you start to shake.  You have: ? A sore throat. ? A warm, red, or tender area on your skin. ? A cough. ? Frequent or painful urination. ? Vaginal discharge or itching.  You develop: ? Sores in your mouth or anus. ? Swollen lymph nodes. ? Red streaks on the skin. ? A rash.  You feel: ? Nauseous or you vomit. ? Very fatigued. ? Short of breath. This information is not intended to replace advice given to you by your health care provider. Make sure you  discuss any questions you have with your health care provider. Document Released: 12/30/2001 Document Revised: 03/06/2018 Document Reviewed: 01/20/2015 Elsevier Interactive Patient Education  2019 [employer].

## 2018-12-25 NOTE — Progress Notes (Signed)
Per Dr. Benay Spice: Needs platelets today and to be crossmatched for platelets on Friday. Confirmed with blood bank that they have random platelets in house for today. Will request type and screen for possible transfusion of red cells on Friday as well since Hgb today is 8.1 After MD discussion w/physical at [facility]: start the following: Levaquin 500 mg daily Diflucan 100 mg daily  Valtrex 500 mg twice daily. Call to [facility] to follow up on Amicar script sent in yesterday. It was ordered last night and and in stock now.

## 2018-12-25 NOTE — Progress Notes (Signed)
Pt. Had labs drawn and BB placed, Pt will return at 1:30 for injection.

## 2018-12-26 ENCOUNTER — Other Ambulatory Visit: Payer: Self-pay

## 2018-12-26 ENCOUNTER — Inpatient Hospital Stay: Payer: BC Managed Care – PPO

## 2018-12-26 VITALS — BP 112/50 | HR 77 | Temp 98.7°F | Resp 18

## 2018-12-26 DIAGNOSIS — D801 Nonfamilial hypogammaglobulinemia: Secondary | ICD-10-CM | POA: Diagnosis not present

## 2018-12-26 DIAGNOSIS — D696 Thrombocytopenia, unspecified: Secondary | ICD-10-CM

## 2018-12-26 DIAGNOSIS — I341 Nonrheumatic mitral (valve) prolapse: Secondary | ICD-10-CM | POA: Diagnosis not present

## 2018-12-26 DIAGNOSIS — M542 Cervicalgia: Secondary | ICD-10-CM | POA: Diagnosis not present

## 2018-12-26 DIAGNOSIS — R161 Splenomegaly, not elsewhere classified: Secondary | ICD-10-CM | POA: Diagnosis not present

## 2018-12-26 DIAGNOSIS — D61818 Other pancytopenia: Secondary | ICD-10-CM | POA: Diagnosis not present

## 2018-12-26 DIAGNOSIS — N6331 Unspecified lump in axillary tail of the right breast: Secondary | ICD-10-CM | POA: Diagnosis not present

## 2018-12-26 DIAGNOSIS — E039 Hypothyroidism, unspecified: Secondary | ICD-10-CM | POA: Diagnosis not present

## 2018-12-26 DIAGNOSIS — J189 Pneumonia, unspecified organism: Secondary | ICD-10-CM | POA: Diagnosis not present

## 2018-12-26 DIAGNOSIS — F329 Major depressive disorder, single episode, unspecified: Secondary | ICD-10-CM | POA: Diagnosis not present

## 2018-12-26 DIAGNOSIS — Z79899 Other long term (current) drug therapy: Secondary | ICD-10-CM | POA: Diagnosis not present

## 2018-12-26 DIAGNOSIS — G4700 Insomnia, unspecified: Secondary | ICD-10-CM | POA: Diagnosis not present

## 2018-12-26 DIAGNOSIS — C9110 Chronic lymphocytic leukemia of B-cell type not having achieved remission: Secondary | ICD-10-CM | POA: Diagnosis not present

## 2018-12-26 DIAGNOSIS — R0602 Shortness of breath: Secondary | ICD-10-CM | POA: Diagnosis not present

## 2018-12-26 LAB — BPAM PLATELET PHERESIS
Blood Product Expiration Date: 202006042359
ISSUE DATE / TIME: 202006031424
Unit Type and Rh: 5100

## 2018-12-26 LAB — PREPARE PLATELET PHERESIS: Unit division: 0

## 2018-12-26 MED ORDER — TBO-FILGRASTIM 300 MCG/0.5ML ~~LOC~~ SOSY
PREFILLED_SYRINGE | SUBCUTANEOUS | Status: AC
  Filled 2018-12-26: qty 0.5

## 2018-12-26 MED ORDER — TBO-FILGRASTIM 300 MCG/0.5ML ~~LOC~~ SOSY
300.0000 ug | PREFILLED_SYRINGE | Freq: Once | SUBCUTANEOUS | Status: AC
  Administered 2018-12-26: 300 ug via SUBCUTANEOUS

## 2018-12-27 ENCOUNTER — Inpatient Hospital Stay: Payer: BC Managed Care – PPO

## 2018-12-27 ENCOUNTER — Telehealth: Payer: Self-pay | Admitting: Psychiatry

## 2018-12-27 ENCOUNTER — Inpatient Hospital Stay (HOSPITAL_BASED_OUTPATIENT_CLINIC_OR_DEPARTMENT_OTHER): Payer: BC Managed Care – PPO | Admitting: Nurse Practitioner

## 2018-12-27 ENCOUNTER — Encounter: Payer: Self-pay | Admitting: Nurse Practitioner

## 2018-12-27 ENCOUNTER — Telehealth: Payer: Self-pay | Admitting: Nurse Practitioner

## 2018-12-27 ENCOUNTER — Other Ambulatory Visit: Payer: Self-pay

## 2018-12-27 VITALS — BP 105/57 | HR 79 | Temp 98.7°F | Resp 18 | Ht 66.0 in | Wt 129.8 lb

## 2018-12-27 VITALS — BP 100/80 | HR 74 | Temp 98.4°F | Resp 16

## 2018-12-27 DIAGNOSIS — M542 Cervicalgia: Secondary | ICD-10-CM | POA: Diagnosis not present

## 2018-12-27 DIAGNOSIS — D696 Thrombocytopenia, unspecified: Secondary | ICD-10-CM

## 2018-12-27 DIAGNOSIS — R0602 Shortness of breath: Secondary | ICD-10-CM | POA: Diagnosis not present

## 2018-12-27 DIAGNOSIS — C9110 Chronic lymphocytic leukemia of B-cell type not having achieved remission: Secondary | ICD-10-CM

## 2018-12-27 DIAGNOSIS — D649 Anemia, unspecified: Secondary | ICD-10-CM

## 2018-12-27 DIAGNOSIS — E039 Hypothyroidism, unspecified: Secondary | ICD-10-CM

## 2018-12-27 DIAGNOSIS — N6331 Unspecified lump in axillary tail of the right breast: Secondary | ICD-10-CM | POA: Diagnosis not present

## 2018-12-27 DIAGNOSIS — I341 Nonrheumatic mitral (valve) prolapse: Secondary | ICD-10-CM | POA: Diagnosis not present

## 2018-12-27 DIAGNOSIS — R161 Splenomegaly, not elsewhere classified: Secondary | ICD-10-CM | POA: Diagnosis not present

## 2018-12-27 DIAGNOSIS — D801 Nonfamilial hypogammaglobulinemia: Secondary | ICD-10-CM

## 2018-12-27 DIAGNOSIS — D61818 Other pancytopenia: Secondary | ICD-10-CM | POA: Diagnosis not present

## 2018-12-27 DIAGNOSIS — Z79899 Other long term (current) drug therapy: Secondary | ICD-10-CM

## 2018-12-27 DIAGNOSIS — J189 Pneumonia, unspecified organism: Secondary | ICD-10-CM | POA: Diagnosis not present

## 2018-12-27 DIAGNOSIS — F329 Major depressive disorder, single episode, unspecified: Secondary | ICD-10-CM | POA: Diagnosis not present

## 2018-12-27 DIAGNOSIS — G4700 Insomnia, unspecified: Secondary | ICD-10-CM | POA: Diagnosis not present

## 2018-12-27 LAB — CBC WITH DIFFERENTIAL (CANCER CENTER ONLY)
Abs Immature Granulocytes: 0 10*3/uL (ref 0.00–0.07)
Basophils Absolute: 0 10*3/uL (ref 0.0–0.1)
Basophils Relative: 1 %
Eosinophils Absolute: 0 10*3/uL (ref 0.0–0.5)
Eosinophils Relative: 0 %
HCT: 22.2 % — ABNORMAL LOW (ref 36.0–46.0)
Hemoglobin: 7.3 g/dL — ABNORMAL LOW (ref 12.0–15.0)
Immature Granulocytes: 0 %
Lymphocytes Relative: 78 %
Lymphs Abs: 0.9 10*3/uL (ref 0.7–4.0)
MCH: 29.2 pg (ref 26.0–34.0)
MCHC: 32.9 g/dL (ref 30.0–36.0)
MCV: 88.8 fL (ref 80.0–100.0)
Monocytes Absolute: 0 10*3/uL — ABNORMAL LOW (ref 0.1–1.0)
Monocytes Relative: 2 %
Neutro Abs: 0.2 10*3/uL — CL (ref 1.7–7.7)
Neutrophils Relative %: 19 %
Platelet Count: 11 10*3/uL — ABNORMAL LOW (ref 150–400)
RBC: 2.5 MIL/uL — ABNORMAL LOW (ref 3.87–5.11)
RDW: 12.2 % (ref 11.5–15.5)
WBC Count: 1.2 10*3/uL — ABNORMAL LOW (ref 4.0–10.5)
nRBC: 0 % (ref 0.0–0.2)

## 2018-12-27 LAB — CMP (CANCER CENTER ONLY)
ALT: 18 U/L (ref 0–44)
AST: 13 U/L — ABNORMAL LOW (ref 15–41)
Albumin: 4.2 g/dL (ref 3.5–5.0)
Alkaline Phosphatase: 103 U/L (ref 38–126)
Anion gap: 7 (ref 5–15)
BUN: 20 mg/dL (ref 6–20)
CO2: 27 mmol/L (ref 22–32)
Calcium: 9.6 mg/dL (ref 8.9–10.3)
Chloride: 109 mmol/L (ref 98–111)
Creatinine: 0.78 mg/dL (ref 0.44–1.00)
GFR, Est AFR Am: >60 mL/min (ref >60)
GFR, Estimated: >60 mL/min (ref >60)
Glucose, Bld: 102 mg/dL — ABNORMAL HIGH (ref 70–99)
Potassium: 4.4 mmol/L (ref 3.5–5.1)
Sodium: 143 mmol/L (ref 135–145)
Total Bilirubin: 0.6 mg/dL (ref 0.3–1.2)
Total Protein: 6.3 g/dL — ABNORMAL LOW (ref 6.5–8.1)

## 2018-12-27 LAB — SAMPLE TO BLOOD BANK

## 2018-12-27 LAB — PREPARE RBC (CROSSMATCH)

## 2018-12-27 MED ORDER — SODIUM CHLORIDE 0.9% IV SOLUTION
250.0000 mL | Freq: Once | INTRAVENOUS | Status: AC
  Administered 2018-12-27: 250 mL via INTRAVENOUS
  Filled 2018-12-27: qty 250

## 2018-12-27 MED ORDER — HEPARIN SOD (PORK) LOCK FLUSH 100 UNIT/ML IV SOLN
250.0000 [IU] | INTRAVENOUS | Status: AC | PRN
  Administered 2018-12-27: 250 [IU]
  Filled 2018-12-27: qty 5

## 2018-12-27 MED ORDER — SODIUM CHLORIDE 0.9% FLUSH
10.0000 mL | INTRAVENOUS | Status: DC | PRN
  Administered 2018-12-27: 10 mL
  Filled 2018-12-27: qty 10

## 2018-12-27 MED ORDER — SODIUM CHLORIDE 0.9% FLUSH
3.0000 mL | INTRAVENOUS | Status: AC | PRN
  Administered 2018-12-27: 3 mL
  Filled 2018-12-27: qty 10

## 2018-12-27 MED ORDER — TBO-FILGRASTIM 300 MCG/0.5ML ~~LOC~~ SOSY
300.0000 ug | PREFILLED_SYRINGE | Freq: Once | SUBCUTANEOUS | Status: AC
  Administered 2018-12-27: 300 ug via SUBCUTANEOUS

## 2018-12-27 MED ORDER — TBO-FILGRASTIM 300 MCG/0.5ML ~~LOC~~ SOSY
PREFILLED_SYRINGE | SUBCUTANEOUS | Status: AC
  Filled 2018-12-27: qty 0.5

## 2018-12-27 NOTE — Progress Notes (Addendum)
[place] OFFICE PROGRESS NOTE   Diagnosis: CLL  INTERVAL HISTORY:   Ms. Lacerda returns as scheduled.  She continues daily G-CSF and weekly Nplate.  She denies fever.  She recently noticed a bruise dorsal aspect right hand.  She is not aware of trauma.  No other bleeding.  She has dyspnea on exertion and feels she needs a blood transfusion.  He is tapering off of Lamictal.  Objective:  Vital signs in last 24 hours:  Blood pressure (!) 105/57, pulse 79, temperature 98.7 F (37.1 C), temperature source Oral, resp. rate 18, height 5' 6" (1.676 m), weight 129 lb 12.8 oz (58.9 kg), SpO2 100 %.    HEENT: Small ecchymosis right buccal mucosa. Vascular: No leg edema. Neuro: Alert and oriented. Skin: Resolving ecchymosis dorsal aspect right hand.   Lab Results:  Lab Results  Component Value Date   WBC 1.2 (L) 12/27/2018   HGB 7.3 (L) 12/27/2018   HCT 22.2 (L) 12/27/2018   MCV 88.8 12/27/2018   PLT 11 (L) 12/27/2018   NEUTROABS PENDING 12/27/2018    Imaging:  No results found.  Medications: I have reviewed the patient's current medications.  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 Mild spinal megaly.  Ultrasound-guided biopsy of the left inguinal lymph node 09/18/2017, slightly "purulent "fluid aspirated, core biopsy is consistent with an atypical lymphoid proliferation-extensive necrosis with surrounding epithelioid histiocytes, limited intact lymphoid tissue involved with CLL  Incisional biopsy of a necrotic/purulent left inguinal lymph node on 10/01/2017-extensive necrosis with granulomatous inflammation, small amount of viable lymphoid tissue involved  with CLL, AFB and fungal stains negative  Peripheral blood FISH analysis 02/05/2018- deletion 13q14, no evidence of p53 (17p13) deletion, no evidence of 11q22deletion  Bone marrow biopsy 02/26/2018-hypercellular marrow with extensive involvement by CLL, lymphocytes represent85% of all cells  Ibrutinib initiated 04/03/2018  Ibrutinib placed on hold 04/11/2018 due to onset of arthralgias  Ibrutinib resumed 04/16/2018, discontinued 04/25/2018 secondary to severe arthralgias/arthritis  Ibrutinib resumed at a dose of 140 mg daily 05/03/2018  Ibrutinib dose adjusted to 140 mg alternating with 265m 06/25/2018  Ibrutinib discontinued 07/03/2018 secondary to severe arthralgias  Acalabrutinib 08/16/2018, discontinued 11/15/2018 secondary to persistent severe transfusion dependent anemia and neutropenia/thrombocytopenia, last dose 11/14/2018  Bone marrow biopsy 11/21/2018- decreased cellularity, involvement by CLL, decreased erythroid and granulocytic precursors, decreased megakaryocytes  Cycle 1 rituximab 12/06/2018  Bone marrow biopsy 12/24/2018 at [facility]-hypocellular bone marrow (10%) involved by CLL, representing 50% of marrow cellularity; markedly decreased trilineage hematopoiesis including essentially absent erythropoiesis  2.Hypothyroidism 3.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 Augmentin prescribed. 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 Pancytopenia secondary to CLL and a hypocellular bone marrow  G-CSF and Nplate started 51/49/7026 G-CSF changed to daily beginning 12/17/2018    Disposition: Ms. NRieserappears unchanged.  Dr. SBenay Spicereviewed the bone marrow results from 12/24/2018 with her at today's visit.  Bone marrow is hypocellular, involved by CLL, decreased trilineage hematopoiesis including essentially absent erythropoiesis.  The plan is to continue weaning Lamictal  to off; continue daily G-CSF; continue weekly Nplate; blood and platelet transfusions as indicated.  We will check labs on a Monday Wednesday Friday schedule coinciding with PICC care.  We reviewed the CBC from today.  She has persistent severe pancytopenia.  She will receive a unit of blood and a unit of platelets today.  She is aware to contact the office with fever, chills, other signs of infection, bleeding.  We will see her in follow-up in 1 week.  She will contact the office in the interim with any problems.  Patient seen with Dr. Benay Spice.  25 minutes were spent face-to-face at today's visit with the majority of that time involved in counseling/coordination of care.    Ned Card ANP/GNP-BC   12/27/2018  10:25 AM  This was a shared visit with Ned Card.  Ms. Riga appears unchanged.  She has persistent severe pancytopenia.  She will continue growth factor support and antibiotic prophylaxis.  I communicated with Dr. Prince Solian today.  Rituximab will remain on hold.  Lamictal is being weaned to off.  She continues to receive transfusion support as needed.  [name], MD

## 2018-12-27 NOTE — Patient Instructions (Signed)
Have a great weekend.

## 2018-12-27 NOTE — Telephone Encounter (Signed)
Meagan Walsh called asking you to please call her and remind her how to wean off the lamictal.  If she doesn't answer, please leave a message with the instructions.

## 2018-12-27 NOTE — Telephone Encounter (Signed)
Given instructions, she has actually already started she was just confirming she was doing correctly. She was on day 5. Instructed to call back if she needs anymore tablets for taper.

## 2018-12-27 NOTE — Telephone Encounter (Signed)
Per 6/5 los appts already scheduled per high priority message.

## 2018-12-27 NOTE — Telephone Encounter (Signed)
Patient was taking Lamictal per Darden Dates at the [facility], pt getting confused about instructions please fax to them at [phone]

## 2018-12-27 NOTE — Telephone Encounter (Signed)
Reduce by 1/4 tablet or 50 mg every 5 days until she stops it.  Oncologist wanted her to do this as soon as possible.

## 2018-12-28 ENCOUNTER — Inpatient Hospital Stay: Payer: BC Managed Care – PPO

## 2018-12-28 ENCOUNTER — Other Ambulatory Visit: Payer: Self-pay

## 2018-12-28 VITALS — BP 114/69 | HR 82

## 2018-12-28 DIAGNOSIS — F329 Major depressive disorder, single episode, unspecified: Secondary | ICD-10-CM | POA: Diagnosis not present

## 2018-12-28 DIAGNOSIS — D61818 Other pancytopenia: Secondary | ICD-10-CM | POA: Diagnosis not present

## 2018-12-28 DIAGNOSIS — E039 Hypothyroidism, unspecified: Secondary | ICD-10-CM | POA: Diagnosis not present

## 2018-12-28 DIAGNOSIS — M542 Cervicalgia: Secondary | ICD-10-CM | POA: Diagnosis not present

## 2018-12-28 DIAGNOSIS — D696 Thrombocytopenia, unspecified: Secondary | ICD-10-CM

## 2018-12-28 DIAGNOSIS — C9110 Chronic lymphocytic leukemia of B-cell type not having achieved remission: Secondary | ICD-10-CM | POA: Diagnosis not present

## 2018-12-28 DIAGNOSIS — Z79899 Other long term (current) drug therapy: Secondary | ICD-10-CM | POA: Diagnosis not present

## 2018-12-28 DIAGNOSIS — J189 Pneumonia, unspecified organism: Secondary | ICD-10-CM | POA: Diagnosis not present

## 2018-12-28 DIAGNOSIS — R0602 Shortness of breath: Secondary | ICD-10-CM | POA: Diagnosis not present

## 2018-12-28 DIAGNOSIS — N6331 Unspecified lump in axillary tail of the right breast: Secondary | ICD-10-CM | POA: Diagnosis not present

## 2018-12-28 DIAGNOSIS — R161 Splenomegaly, not elsewhere classified: Secondary | ICD-10-CM | POA: Diagnosis not present

## 2018-12-28 DIAGNOSIS — G4700 Insomnia, unspecified: Secondary | ICD-10-CM | POA: Diagnosis not present

## 2018-12-28 DIAGNOSIS — I341 Nonrheumatic mitral (valve) prolapse: Secondary | ICD-10-CM | POA: Diagnosis not present

## 2018-12-28 DIAGNOSIS — D801 Nonfamilial hypogammaglobulinemia: Secondary | ICD-10-CM | POA: Diagnosis not present

## 2018-12-28 LAB — TYPE AND SCREEN
ABO/RH(D): O POS
Antibody Screen: NEGATIVE
Unit division: 0

## 2018-12-28 LAB — PREPARE PLATELET PHERESIS: Unit division: 0

## 2018-12-28 LAB — BPAM PLATELET PHERESIS
Blood Product Expiration Date: 202006072359
ISSUE DATE / TIME: 202006051138
Unit Type and Rh: 5100

## 2018-12-28 LAB — BPAM RBC
Blood Product Expiration Date: 202007032359
ISSUE DATE / TIME: 202006051138
Unit Type and Rh: 5100

## 2018-12-28 MED ORDER — TBO-FILGRASTIM 300 MCG/0.5ML ~~LOC~~ SOSY
300.0000 ug | PREFILLED_SYRINGE | Freq: Once | SUBCUTANEOUS | Status: AC
  Administered 2018-12-28: 300 ug via SUBCUTANEOUS

## 2018-12-28 MED ORDER — HEPARIN SOD (PORK) LOCK FLUSH 100 UNIT/ML IV SOLN
500.0000 [IU] | Freq: Once | INTRAVENOUS | Status: AC | PRN
  Administered 2018-12-28: 500 [IU]
  Filled 2018-12-28: qty 5

## 2018-12-28 MED ORDER — TBO-FILGRASTIM 300 MCG/0.5ML ~~LOC~~ SOSY
PREFILLED_SYRINGE | SUBCUTANEOUS | Status: AC
  Filled 2018-12-28: qty 0.5

## 2018-12-28 MED ORDER — SODIUM CHLORIDE 0.9% FLUSH
10.0000 mL | INTRAVENOUS | Status: DC | PRN
  Administered 2018-12-28: 10 mL
  Filled 2018-12-28: qty 10

## 2018-12-28 NOTE — Patient Instructions (Signed)
Tbo-Filgrastim injection What is this medicine? TBO-FILGRASTIM (T B O fil GRA stim) is a granulocyte colony-stimulating factor that stimulates the growth of neutrophils, a type of white blood cell important in the body's fight against infection. It is used to reduce the incidence of fever and infection in patients with certain types of cancer who are receiving chemotherapy that affects the bone marrow. This medicine may be used for other purposes; ask your health care provider or pharmacist if you have questions. COMMON BRAND NAME(S): Granix What should I tell my health care provider before I take this medicine? They need to know if you have any of these conditions: -bone scan or tests planned -kidney disease -sickle cell anemia -an unusual or allergic reaction to tbo-filgrastim, filgrastim, pegfilgrastim, other medicines, foods, dyes, or preservatives -pregnant or trying to get pregnant -breast-feeding How should I use this medicine? This medicine is for injection under the skin. If you get this medicine at home, you will be taught how to prepare and give this medicine. Refer to the Instructions for Use that come with your medication packaging. Use exactly as directed. Take your medicine at regular intervals. Do not take your medicine more often than directed. It is important that you put your used needles and syringes in a special sharps container. Do not put them in a trash can. If you do not have a sharps container, call your pharmacist or healthcare provider to get one. Talk to your pediatrician regarding the use of this medicine in children. While this drug may be prescribed for children as young as 1 month of age for selected conditions, precautions do apply. Overdosage: If you think you have taken too much of this medicine contact a poison control center or emergency room at once. NOTE: This medicine is only for you. Do not share this medicine with others. What if I miss a dose? It is  important not to miss your dose. Call your doctor or health care professional if you miss a dose. What may interact with this medicine? This medicine may interact with the following medications: -medicines that may cause a release of neutrophils, such as lithium This list may not describe all possible interactions. Give your health care provider a list of all the medicines, herbs, non-prescription drugs, or dietary supplements you use. Also tell them if you smoke, drink alcohol, or use illegal drugs. Some items may interact with your medicine. What should I watch for while using this medicine? You may need blood work done while you are taking this medicine. What side effects may I notice from receiving this medicine? Side effects that you should report to your doctor or health care professional as soon as possible: -allergic reactions like skin rash, itching or hives, swelling of the face, lips, or tongue -back pain -blood in the urine -dark urine -dizziness -fast heartbeat -feeling faint -shortness of breath or breathing problems -signs and symptoms of infection like fever or chills; cough; or sore throat -signs and symptoms of kidney injury like trouble passing urine or change in the amount of urine -stomach or side pain, or pain at the shoulder -sweating -swelling of the legs, ankles, or abdomen -tiredness Side effects that usually do not require medical attention (report to your doctor or health care professional if they continue or are bothersome): -bone pain -diarrhea -headache -muscle pain -vomiting This list may not describe all possible side effects. Call your doctor for medical advice about side effects. You may report side effects to FDA at   1-800-FDA-1088. Where should I keep my medicine? Keep out of the reach of children. Store in a refrigerator between 2 and 8 degrees C (36 and 46 degrees F). Keep in carton to protect from light. Throw away this medicine if it is left out  of the refrigerator for more than 5 consecutive days. Throw away any unused medicine after the expiration date. NOTE: This sheet is a summary. It may not cover all possible information. If you have questions about this medicine, talk to your doctor, pharmacist, or health care provider.  2019 Elsevier/Gold Standard (2017-02-27 16:56:18)  

## 2018-12-30 ENCOUNTER — Inpatient Hospital Stay: Payer: BC Managed Care – PPO

## 2018-12-30 ENCOUNTER — Other Ambulatory Visit: Payer: Self-pay

## 2018-12-30 ENCOUNTER — Telehealth: Payer: Self-pay

## 2018-12-30 ENCOUNTER — Telehealth: Payer: Self-pay | Admitting: Oncology

## 2018-12-30 ENCOUNTER — Telehealth: Payer: Self-pay | Admitting: Nurse Practitioner

## 2018-12-30 VITALS — BP 107/68 | HR 80 | Temp 98.5°F | Resp 16

## 2018-12-30 DIAGNOSIS — I341 Nonrheumatic mitral (valve) prolapse: Secondary | ICD-10-CM | POA: Diagnosis not present

## 2018-12-30 DIAGNOSIS — M542 Cervicalgia: Secondary | ICD-10-CM | POA: Diagnosis not present

## 2018-12-30 DIAGNOSIS — G4700 Insomnia, unspecified: Secondary | ICD-10-CM | POA: Diagnosis not present

## 2018-12-30 DIAGNOSIS — N6331 Unspecified lump in axillary tail of the right breast: Secondary | ICD-10-CM | POA: Diagnosis not present

## 2018-12-30 DIAGNOSIS — D696 Thrombocytopenia, unspecified: Secondary | ICD-10-CM

## 2018-12-30 DIAGNOSIS — R0602 Shortness of breath: Secondary | ICD-10-CM | POA: Diagnosis not present

## 2018-12-30 DIAGNOSIS — D61818 Other pancytopenia: Secondary | ICD-10-CM | POA: Diagnosis not present

## 2018-12-30 DIAGNOSIS — C9110 Chronic lymphocytic leukemia of B-cell type not having achieved remission: Secondary | ICD-10-CM | POA: Diagnosis not present

## 2018-12-30 DIAGNOSIS — E039 Hypothyroidism, unspecified: Secondary | ICD-10-CM | POA: Diagnosis not present

## 2018-12-30 DIAGNOSIS — R161 Splenomegaly, not elsewhere classified: Secondary | ICD-10-CM | POA: Diagnosis not present

## 2018-12-30 DIAGNOSIS — J189 Pneumonia, unspecified organism: Secondary | ICD-10-CM | POA: Diagnosis not present

## 2018-12-30 DIAGNOSIS — Z79899 Other long term (current) drug therapy: Secondary | ICD-10-CM | POA: Diagnosis not present

## 2018-12-30 DIAGNOSIS — F329 Major depressive disorder, single episode, unspecified: Secondary | ICD-10-CM | POA: Diagnosis not present

## 2018-12-30 DIAGNOSIS — D801 Nonfamilial hypogammaglobulinemia: Secondary | ICD-10-CM | POA: Diagnosis not present

## 2018-12-30 LAB — CBC WITH DIFFERENTIAL (CANCER CENTER ONLY)
Abs Immature Granulocytes: 0.02 10*3/uL (ref 0.00–0.07)
Basophils Absolute: 0 10*3/uL (ref 0.0–0.1)
Basophils Relative: 0 %
Eosinophils Absolute: 0 10*3/uL (ref 0.0–0.5)
Eosinophils Relative: 0 %
HCT: 27.3 % — ABNORMAL LOW (ref 36.0–46.0)
Hemoglobin: 8.9 g/dL — ABNORMAL LOW (ref 12.0–15.0)
Immature Granulocytes: 1 %
Lymphocytes Relative: 74 %
Lymphs Abs: 1.1 10*3/uL (ref 0.7–4.0)
MCH: 29.3 pg (ref 26.0–34.0)
MCHC: 32.6 g/dL (ref 30.0–36.0)
MCV: 89.8 fL (ref 80.0–100.0)
Monocytes Absolute: 0 10*3/uL — ABNORMAL LOW (ref 0.1–1.0)
Monocytes Relative: 2 %
Neutro Abs: 0.3 10*3/uL — CL (ref 1.7–7.7)
Neutrophils Relative %: 23 %
Platelet Count: 16 10*3/uL — ABNORMAL LOW (ref 150–400)
RBC: 3.04 MIL/uL — ABNORMAL LOW (ref 3.87–5.11)
RDW: 12.2 % (ref 11.5–15.5)
WBC Count: 1.4 10*3/uL — ABNORMAL LOW (ref 4.0–10.5)
nRBC: 0 % (ref 0.0–0.2)

## 2018-12-30 MED ORDER — ROMIPLOSTIM 250 MCG ~~LOC~~ SOLR
4.0000 ug/kg | Freq: Once | SUBCUTANEOUS | Status: AC
  Administered 2018-12-30: 235 ug via SUBCUTANEOUS
  Filled 2018-12-30: qty 0.47

## 2018-12-30 MED ORDER — HEPARIN SOD (PORK) LOCK FLUSH 100 UNIT/ML IV SOLN
500.0000 [IU] | Freq: Once | INTRAVENOUS | Status: AC | PRN
  Administered 2018-12-30: 25 [IU]
  Filled 2018-12-30: qty 5

## 2018-12-30 MED ORDER — TBO-FILGRASTIM 300 MCG/0.5ML ~~LOC~~ SOSY
PREFILLED_SYRINGE | SUBCUTANEOUS | Status: AC
  Filled 2018-12-30: qty 0.5

## 2018-12-30 MED ORDER — TBO-FILGRASTIM 300 MCG/0.5ML ~~LOC~~ SOSY
300.0000 ug | PREFILLED_SYRINGE | Freq: Once | SUBCUTANEOUS | Status: AC
  Administered 2018-12-30: 300 ug via SUBCUTANEOUS

## 2018-12-30 MED ORDER — SODIUM CHLORIDE 0.9% FLUSH
10.0000 mL | INTRAVENOUS | Status: DC | PRN
  Administered 2018-12-30: 10 mL
  Filled 2018-12-30: qty 10

## 2018-12-30 NOTE — Telephone Encounter (Signed)
Advised pt per Dr. Benay Spice, ok to have Mirena device removed once blood counts are normal. Pt verbalized understanding.

## 2018-12-30 NOTE — Progress Notes (Signed)
Spoke w/ Dr. Benay Spice, will increase dose of Nplate today to 4 mcg/kg (was previously titrated up to 3 mcg/kg, platelet count today is 16K).   Demetrius Charity, PharmD, [facility] Phone: [phone] 12/30/2018

## 2018-12-30 NOTE — Telephone Encounter (Signed)
Per sch msg, called to speak with patient about her schedule. No answer. Left msg to call back

## 2018-12-30 NOTE — Patient Instructions (Addendum)
Romiplostim injection What is this medicine? ROMIPLOSTIM (roe mi PLOE stim) helps your body make more platelets. This medicine is used to treat low platelets caused by chronic idiopathic thrombocytopenic purpura (ITP). This medicine may be used for other purposes; ask your health care provider or pharmacist if you have questions. COMMON BRAND NAME(S): Nplate What should I tell my health care provider before I take this medicine? They need to know if you have any of these conditions: -bleeding disorders -bone marrow problem, like blood cancer or myelodysplastic syndrome -history of blood clots -liver disease -surgery to remove your spleen -an unusual or allergic reaction to romiplostim, mannitol, other medicines, foods, dyes, or preservatives -pregnant or trying to get pregnant -breast-feeding How should I use this medicine? This medicine is for injection under the skin. It is given by a health care professional in a hospital or clinic setting. A special MedGuide will be given to you before your injection. Read this information carefully each time. Talk to your pediatrician regarding the use of this medicine in children. While this drug may be prescribed for children as young as 1 year for selected conditions, precautions do apply. Overdosage: If you think you have taken too much of this medicine contact a poison control center or emergency room at once. NOTE: This medicine is only for you. Do not share this medicine with others. What if I miss a dose? It is important not to miss your dose. Call your doctor or health care professional if you are unable to keep an appointment. What may interact with this medicine? Interactions are not expected. This list may not describe all possible interactions. Give your health care provider a list of all the medicines, herbs, non-prescription drugs, or dietary supplements you use. Also tell them if you smoke, drink alcohol, or use illegal drugs. Some items  may interact with your medicine. What should I watch for while using this medicine? Your condition will be monitored carefully while you are receiving this medicine. Visit your prescriber or health care professional for regular checks on your progress and for the needed blood tests. It is important to keep all appointments. What side effects may I notice from receiving this medicine? Side effects that you should report to your doctor or health care professional as soon as possible: -allergic reactions like skin rash, itching or hives, swelling of the face, lips, or tongue -signs and symptoms of bleeding such as bloody or black, tarry stools; red or dark brown urine; spitting up blood or brown material that looks like coffee grounds; red spots on the skin; unusual bruising or bleeding from the eyes, gums, or nose -signs and symptoms of a blood clot such as chest pain; shortness of breath; pain, swelling, or warmth in the leg -signs and symptoms of a stroke like changes in vision; confusion; trouble speaking or understanding; severe headaches; sudden numbness or weakness of the face, arm or leg; trouble walking; dizziness; loss of balance or coordination Side effects that usually do not require medical attention (report to your doctor or health care professional if they continue or are bothersome): -headache -pain in arms and legs -pain in mouth -stomach pain This list may not describe all possible side effects. Call your doctor for medical advice about side effects. You may report side effects to FDA at 1-800-FDA-1088. Where should I keep my medicine? This drug is given in a hospital or clinic and will not be stored at home. NOTE: This sheet is a summary. It may not  cover all possible information. If you have questions about this medicine, talk to your doctor, pharmacist, or health care provider.  2019 Elsevier/Gold Standard (2017-07-09 11:10:55) Tbo-Filgrastim injection What is this  medicine? TBO-FILGRASTIM (T B O fil GRA stim) is a granulocyte colony-stimulating factor that stimulates the growth of neutrophils, a type of white blood cell important in the body's fight against infection. It is used to reduce the incidence of fever and infection in patients with certain types of cancer who are receiving chemotherapy that affects the bone marrow. This medicine may be used for other purposes; ask your health care provider or pharmacist if you have questions. COMMON BRAND NAME(S): Granix What should I tell my health care provider before I take this medicine? They need to know if you have any of these conditions: -bone scan or tests planned -kidney disease -sickle cell anemia -an unusual or allergic reaction to tbo-filgrastim, filgrastim, pegfilgrastim, other medicines, foods, dyes, or preservatives -pregnant or trying to get pregnant -breast-feeding How should I use this medicine? This medicine is for injection under the skin. If you get this medicine at home, you will be taught how to prepare and give this medicine. Refer to the Instructions for Use that come with your medication packaging. Use exactly as directed. Take your medicine at regular intervals. Do not take your medicine more often than directed. It is important that you put your used needles and syringes in a special sharps container. Do not put them in a trash can. If you do not have a sharps container, call your pharmacist or healthcare provider to get one. Talk to your pediatrician regarding the use of this medicine in children. While this drug may be prescribed for children as young as 34 month of age for selected conditions, precautions do apply. Overdosage: If you think you have taken too much of this medicine contact a poison control center or emergency room at once. NOTE: This medicine is only for you. Do not share this medicine with others. What if I miss a dose? It is important not to miss your dose. Call your  doctor or health care professional if you miss a dose. What may interact with this medicine? This medicine may interact with the following medications: -medicines that may cause a release of neutrophils, such as lithium This list may not describe all possible interactions. Give your health care provider a list of all the medicines, herbs, non-prescription drugs, or dietary supplements you use. Also tell them if you smoke, drink alcohol, or use illegal drugs. Some items may interact with your medicine. What should I watch for while using this medicine? You may need blood work done while you are taking this medicine. What side effects may I notice from receiving this medicine? Side effects that you should report to your doctor or health care professional as soon as possible: -allergic reactions like skin rash, itching or hives, swelling of the face, lips, or tongue -back pain -blood in the urine -dark urine -dizziness -fast heartbeat -feeling faint -shortness of breath or breathing problems -signs and symptoms of infection like fever or chills; cough; or sore throat -signs and symptoms of kidney injury like trouble passing urine or change in the amount of urine -stomach or side pain, or pain at the shoulder -sweating -swelling of the legs, ankles, or abdomen -tiredness Side effects that usually do not require medical attention (report to your doctor or health care professional if they continue or are bothersome): -bone pain -diarrhea -headache -muscle pain -  vomiting This list may not describe all possible side effects. Call your doctor for medical advice about side effects. You may report side effects to FDA at 1-800-FDA-1088. Where should I keep my medicine? Keep out of the reach of children. Store in a refrigerator between 2 and 8 degrees C (36 and 46 degrees F). Keep in carton to protect from light. Throw away this medicine if it is left out of the refrigerator for more than 5  consecutive days. Throw away any unused medicine after the expiration date. NOTE: This sheet is a summary. It may not cover all possible information. If you have questions about this medicine, talk to your doctor, pharmacist, or health care provider.  2019 Elsevier/Gold Standard (2017-02-27 16:56:18)

## 2018-12-30 NOTE — Telephone Encounter (Signed)
Per Dr. Benay Spice I notified [name] the daily Granix injections will be discontinued.  Plan to continue weekly Nplate.

## 2018-12-31 ENCOUNTER — Other Ambulatory Visit: Payer: Self-pay | Admitting: *Deleted

## 2018-12-31 ENCOUNTER — Ambulatory Visit: Payer: BC Managed Care – PPO

## 2018-12-31 ENCOUNTER — Telehealth: Payer: Self-pay | Admitting: *Deleted

## 2018-12-31 DIAGNOSIS — C9110 Chronic lymphocytic leukemia of B-cell type not having achieved remission: Secondary | ICD-10-CM

## 2018-12-31 DIAGNOSIS — D696 Thrombocytopenia, unspecified: Secondary | ICD-10-CM

## 2018-12-31 NOTE — Telephone Encounter (Signed)
Called to report she has noticed an approximately 1 inch linear swelling in her right axilla. Slightly tender to touch. Asking if MD/NP want to look at it tomorrow or can it wait till her OV on 01/03/19?

## 2018-12-31 NOTE — Progress Notes (Signed)
Per Dr. Benay Spice: Need to prepare to crossmatch platelets for transfusion on 01/03/19.

## 2019-01-01 ENCOUNTER — Other Ambulatory Visit: Payer: Self-pay | Admitting: *Deleted

## 2019-01-01 ENCOUNTER — Inpatient Hospital Stay: Payer: BC Managed Care – PPO

## 2019-01-01 ENCOUNTER — Telehealth: Payer: Self-pay | Admitting: *Deleted

## 2019-01-01 ENCOUNTER — Encounter: Payer: Self-pay | Admitting: Nurse Practitioner

## 2019-01-01 ENCOUNTER — Other Ambulatory Visit: Payer: Self-pay

## 2019-01-01 ENCOUNTER — Inpatient Hospital Stay (HOSPITAL_BASED_OUTPATIENT_CLINIC_OR_DEPARTMENT_OTHER): Payer: BC Managed Care – PPO | Admitting: Nurse Practitioner

## 2019-01-01 ENCOUNTER — Telehealth: Payer: Self-pay | Admitting: Nurse Practitioner

## 2019-01-01 VITALS — BP 107/67 | HR 97 | Temp 98.4°F | Resp 18 | Ht 66.0 in | Wt 130.5 lb

## 2019-01-01 DIAGNOSIS — C9110 Chronic lymphocytic leukemia of B-cell type not having achieved remission: Secondary | ICD-10-CM

## 2019-01-01 DIAGNOSIS — E039 Hypothyroidism, unspecified: Secondary | ICD-10-CM

## 2019-01-01 DIAGNOSIS — I341 Nonrheumatic mitral (valve) prolapse: Secondary | ICD-10-CM | POA: Diagnosis not present

## 2019-01-01 DIAGNOSIS — Z79899 Other long term (current) drug therapy: Secondary | ICD-10-CM

## 2019-01-01 DIAGNOSIS — D696 Thrombocytopenia, unspecified: Secondary | ICD-10-CM

## 2019-01-01 DIAGNOSIS — R0602 Shortness of breath: Secondary | ICD-10-CM

## 2019-01-01 DIAGNOSIS — D61818 Other pancytopenia: Secondary | ICD-10-CM | POA: Diagnosis not present

## 2019-01-01 DIAGNOSIS — J189 Pneumonia, unspecified organism: Secondary | ICD-10-CM

## 2019-01-01 DIAGNOSIS — G4700 Insomnia, unspecified: Secondary | ICD-10-CM | POA: Diagnosis not present

## 2019-01-01 DIAGNOSIS — M542 Cervicalgia: Secondary | ICD-10-CM | POA: Diagnosis not present

## 2019-01-01 DIAGNOSIS — D801 Nonfamilial hypogammaglobulinemia: Secondary | ICD-10-CM

## 2019-01-01 DIAGNOSIS — N6331 Unspecified lump in axillary tail of the right breast: Secondary | ICD-10-CM | POA: Diagnosis not present

## 2019-01-01 DIAGNOSIS — F329 Major depressive disorder, single episode, unspecified: Secondary | ICD-10-CM | POA: Diagnosis not present

## 2019-01-01 DIAGNOSIS — R161 Splenomegaly, not elsewhere classified: Secondary | ICD-10-CM | POA: Diagnosis not present

## 2019-01-01 LAB — SAMPLE TO BLOOD BANK

## 2019-01-01 LAB — CBC WITH DIFFERENTIAL (CANCER CENTER ONLY)
Abs Immature Granulocytes: 0 10*3/uL (ref 0.00–0.07)
Basophils Absolute: 0 10*3/uL (ref 0.0–0.1)
Basophils Relative: 0 %
Eosinophils Absolute: 0 10*3/uL (ref 0.0–0.5)
Eosinophils Relative: 0 %
HCT: 24.5 % — ABNORMAL LOW (ref 36.0–46.0)
Hemoglobin: 8.2 g/dL — ABNORMAL LOW (ref 12.0–15.0)
Immature Granulocytes: 0 %
Lymphocytes Relative: 63 %
Lymphs Abs: 0.5 10*3/uL — ABNORMAL LOW (ref 0.7–4.0)
MCH: 29.6 pg (ref 26.0–34.0)
MCHC: 33.5 g/dL (ref 30.0–36.0)
MCV: 88.4 fL (ref 80.0–100.0)
Monocytes Absolute: 0 10*3/uL — ABNORMAL LOW (ref 0.1–1.0)
Monocytes Relative: 5 %
Neutro Abs: 0.3 10*3/uL — CL (ref 1.7–7.7)
Neutrophils Relative %: 32 %
Platelet Count: 7 10*3/uL — CL (ref 150–400)
RBC: 2.77 MIL/uL — ABNORMAL LOW (ref 3.87–5.11)
RDW: 12.1 % (ref 11.5–15.5)
WBC Count: 0.9 10*3/uL — CL (ref 4.0–10.5)
nRBC: 0 % (ref 0.0–0.2)

## 2019-01-01 MED ORDER — HEPARIN SOD (PORK) LOCK FLUSH 100 UNIT/ML IV SOLN
500.0000 [IU] | Freq: Once | INTRAVENOUS | Status: DC | PRN
  Filled 2019-01-01: qty 5

## 2019-01-01 MED ORDER — SODIUM CHLORIDE 0.9% IV SOLUTION
250.0000 mL | Freq: Once | INTRAVENOUS | Status: AC
  Administered 2019-01-01: 250 mL via INTRAVENOUS
  Filled 2019-01-01: qty 250

## 2019-01-01 MED ORDER — SODIUM CHLORIDE 0.9% FLUSH
10.0000 mL | INTRAVENOUS | Status: AC | PRN
  Administered 2019-01-01: 10 mL
  Filled 2019-01-01: qty 10

## 2019-01-01 MED ORDER — SODIUM CHLORIDE 0.9% FLUSH
10.0000 mL | INTRAVENOUS | Status: DC | PRN
  Administered 2019-01-01: 10 mL
  Filled 2019-01-01: qty 10

## 2019-01-01 MED ORDER — HEPARIN SOD (PORK) LOCK FLUSH 100 UNIT/ML IV SOLN
250.0000 [IU] | INTRAVENOUS | Status: AC | PRN
  Administered 2019-01-01: 250 [IU]
  Filled 2019-01-01: qty 5

## 2019-01-01 NOTE — Telephone Encounter (Signed)
Per 6/10 los F/u as scheduled

## 2019-01-01 NOTE — Telephone Encounter (Signed)
Received call from lab with critical lab results. WBC is 0.9 ANC  0.3 Plts 7K HGB 8.1  Dr. Gearldine Shown nurse made aware

## 2019-01-01 NOTE — Telephone Encounter (Signed)
See previous phone message. 

## 2019-01-01 NOTE — Patient Instructions (Signed)

## 2019-01-01 NOTE — Progress Notes (Addendum)
[facility] OFFICE PROGRESS NOTE   Diagnosis: CLL  INTERVAL HISTORY:   Meagan Walsh returns prior to scheduled follow-up for evaluation of a possible axillary lymph node.  Yesterday she noted a possible node in the right axilla, mildly sore.  She has not noticed other adenopathy.  She denies fever.  No bleeding.  She has dyspnea on exertion.  Objective:  Vital signs in last 24 hours:  Blood pressure 107/67, pulse 97, temperature 98.4 F (36.9 C), temperature source Oral, resp. rate 18, height '5\' 6"'  (1.676 m), weight 130 lb 8 oz (59.2 kg), SpO2 100 %.    HEENT: Small ecchymosis at the right buccal mucosa. Lymphatics: No palpable cervical, supraclavicular, left axillary or inguinal lymph nodes.  1 to 1.5 cm  mobile lymph node right axilla. Vascular: No leg edema. Skin: Few petechiae right lateral abdominal wall. Left upper extremity PICC without erythema.   Lab Results:  Lab Results  Component Value Date   WBC 0.9 (LL) 01/01/2019   HGB 8.2 (L) 01/01/2019   HCT 24.5 (L) 01/01/2019   MCV 88.4 01/01/2019   PLT 7 (LL) 01/01/2019   NEUTROABS 0.3 (LL) 01/01/2019    Imaging:  No results found.  Medications: I have reviewed the patient's current medications.  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 Mild spinal megaly.  Ultrasound-guided biopsy of the left inguinal lymph node 09/18/2017, slightly "purulent "fluid aspirated, core biopsy is consistent with an atypical lymphoid proliferation-extensive necrosis with surrounding epithelioid histiocytes, limited intact lymphoid tissue involved with CLL  Incisional biopsy of a necrotic/purulent left inguinal lymph  node on 10/01/2017-extensive necrosis with granulomatous inflammation, small amount of viable lymphoid tissue involved with CLL, AFB and fungal stains negative  Peripheral blood FISH analysis 02/05/2018- deletion 13q14, no evidence of p53 (17p13) deletion, no evidence of 11q22deletion  Bone marrow biopsy 02/26/2018-hypercellular marrow with extensive involvement by CLL, lymphocytes represent85% of all cells  Ibrutinib initiated 04/03/2018  Ibrutinib placed on hold 04/11/2018 due to onset of arthralgias  Ibrutinib resumed 04/16/2018, discontinued 04/25/2018 secondary to severe arthralgias/arthritis  Ibrutinib resumed at a dose of 140 mg daily 05/03/2018  Ibrutinib dose adjusted to 140 mg alternating with 243m 06/25/2018  Ibrutinib discontinued 07/03/2018 secondary to severe arthralgias  Acalabrutinib 08/16/2018, discontinued 11/15/2018 secondary to persistent severe transfusion dependent anemia and neutropenia/thrombocytopenia, last dose 11/14/2018  Bone marrow biopsy 11/21/2018- decreased cellularity, involvement by CLL, decreased erythroid and granulocytic precursors, decreased megakaryocytes  Cycle 1 rituximab 12/06/2018  Bone marrow biopsy 12/24/2018 at [facility]-hypocellular bone marrow (10%) involved by CLL, representing 50% of marrow cellularity; markedly decreased trilineage hematopoiesis including essentially absent erythropoiesis  2.Hypothyroidism 3.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 Augmentin prescribed. 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 Pancytopenia secondary to CLL and a  hypocellular bone marrow  G-CSF and Nplate started 53/76/2831 G-CSF changed to daily beginning 12/17/2018; G-CSF discontinued 12/31/2018   Disposition: Ms. NBachcontinues to have severe pancytopenia.  She will receive a platelet transfusion today.  She continues the taper off of Lamictal.  She is scheduled to complete the taper 01/05/2019.  If the blood counts do not improve off of Lamictal the plan is for a trial of steroids.  She has a palpable right axillary lymph node.  She understands this is likely related to CLL.  We will continue to monitor.  She will return for lab, follow-up and possible red cell and platelet transfusion support on 01/03/2019.  She will contact the office in the interim with any problems.  Patient seen with Dr. Benay Spice.    Ned Card ANP/GNP-BC   01/01/2019  11:35 AM  This was a shared visit with Ned Card.  Meagan Walsh was interviewed and examined.  The palpable change of the right axilla appears to be an enlarged lymph node.  She has persistent severe pancytopenia.  She will received a platelet transfusion today.  I communicated with Dr. Prince Solian earlier this week.  She recommended discontinuing G-CSF.  We increase the Nplate dose.  Meagan Walsh will return for an office visit and transfusion support as needed on 01/03/2019.  The plan is to consider a trial of steroids or treatment for aplastic anemia.  She will be tapered completely off of Lamictal by this weekend.   Meagan Manson, MD

## 2019-01-01 NOTE — Progress Notes (Signed)
Confirmed with blood bank that a random platelet is available today. They will call infusion when it is ready. Secure chat sent to infusion charge nurse to see if we can transfuse platelets today. Patient on her way back to be seen by Ned Card, NP regarding a swelling under her arm.

## 2019-01-01 NOTE — Progress Notes (Signed)
Notified blood bank of need for crossmatched platelets for 01/03/19.

## 2019-01-02 ENCOUNTER — Inpatient Hospital Stay: Payer: BC Managed Care – PPO

## 2019-01-02 LAB — BPAM PLATELET PHERESIS
Blood Product Expiration Date: 202006112359
ISSUE DATE / TIME: 202006101211
Unit Type and Rh: 6200

## 2019-01-02 LAB — PREPARE PLATELET PHERESIS: Unit division: 0

## 2019-01-03 ENCOUNTER — Telehealth
Admit: 2019-01-03 | Discharge: 2019-01-04 | Payer: PRIVATE HEALTH INSURANCE | Attending: Hematology & Oncology | Primary: Hematology & Oncology

## 2019-01-03 ENCOUNTER — Inpatient Hospital Stay (HOSPITAL_BASED_OUTPATIENT_CLINIC_OR_DEPARTMENT_OTHER): Payer: BC Managed Care – PPO | Admitting: Nurse Practitioner

## 2019-01-03 ENCOUNTER — Telehealth: Payer: Self-pay

## 2019-01-03 ENCOUNTER — Telehealth: Payer: Self-pay | Admitting: Oncology

## 2019-01-03 ENCOUNTER — Other Ambulatory Visit: Payer: Self-pay | Admitting: *Deleted

## 2019-01-03 ENCOUNTER — Inpatient Hospital Stay: Payer: BC Managed Care – PPO

## 2019-01-03 ENCOUNTER — Encounter: Payer: Self-pay | Admitting: Nurse Practitioner

## 2019-01-03 ENCOUNTER — Other Ambulatory Visit: Payer: Self-pay

## 2019-01-03 VITALS — BP 99/53 | HR 86 | Temp 98.7°F | Resp 17 | Ht 66.0 in | Wt 129.9 lb

## 2019-01-03 DIAGNOSIS — C9110 Chronic lymphocytic leukemia of B-cell type not having achieved remission: Secondary | ICD-10-CM

## 2019-01-03 DIAGNOSIS — G4700 Insomnia, unspecified: Secondary | ICD-10-CM | POA: Diagnosis not present

## 2019-01-03 DIAGNOSIS — D696 Thrombocytopenia, unspecified: Secondary | ICD-10-CM

## 2019-01-03 DIAGNOSIS — M542 Cervicalgia: Secondary | ICD-10-CM | POA: Diagnosis not present

## 2019-01-03 DIAGNOSIS — F329 Major depressive disorder, single episode, unspecified: Secondary | ICD-10-CM | POA: Diagnosis not present

## 2019-01-03 DIAGNOSIS — J189 Pneumonia, unspecified organism: Secondary | ICD-10-CM | POA: Diagnosis not present

## 2019-01-03 DIAGNOSIS — D801 Nonfamilial hypogammaglobulinemia: Secondary | ICD-10-CM | POA: Diagnosis not present

## 2019-01-03 DIAGNOSIS — R161 Splenomegaly, not elsewhere classified: Secondary | ICD-10-CM | POA: Diagnosis not present

## 2019-01-03 DIAGNOSIS — I341 Nonrheumatic mitral (valve) prolapse: Secondary | ICD-10-CM | POA: Diagnosis not present

## 2019-01-03 DIAGNOSIS — Z79899 Other long term (current) drug therapy: Secondary | ICD-10-CM | POA: Diagnosis not present

## 2019-01-03 DIAGNOSIS — E039 Hypothyroidism, unspecified: Secondary | ICD-10-CM | POA: Diagnosis not present

## 2019-01-03 DIAGNOSIS — N6331 Unspecified lump in axillary tail of the right breast: Secondary | ICD-10-CM | POA: Diagnosis not present

## 2019-01-03 DIAGNOSIS — D61818 Other pancytopenia: Secondary | ICD-10-CM | POA: Diagnosis not present

## 2019-01-03 DIAGNOSIS — R0602 Shortness of breath: Secondary | ICD-10-CM

## 2019-01-03 LAB — CBC WITH DIFFERENTIAL (CANCER CENTER ONLY)
Abs Immature Granulocytes: 0 10*3/uL (ref 0.00–0.07)
Basophils Absolute: 0 10*3/uL (ref 0.0–0.1)
Basophils Relative: 0 %
Eosinophils Absolute: 0 10*3/uL (ref 0.0–0.5)
Eosinophils Relative: 0 %
HCT: 23.6 % — ABNORMAL LOW (ref 36.0–46.0)
Hemoglobin: 7.7 g/dL — ABNORMAL LOW (ref 12.0–15.0)
Immature Granulocytes: 0 %
Lymphocytes Relative: 79 %
Lymphs Abs: 1.2 10*3/uL (ref 0.7–4.0)
MCH: 29.1 pg (ref 26.0–34.0)
MCHC: 32.6 g/dL (ref 30.0–36.0)
MCV: 89.1 fL (ref 80.0–100.0)
Monocytes Absolute: 0 10*3/uL — ABNORMAL LOW (ref 0.1–1.0)
Monocytes Relative: 3 %
Neutro Abs: 0.3 10*3/uL — CL (ref 1.7–7.7)
Neutrophils Relative %: 18 %
Platelet Count: 14 10*3/uL — ABNORMAL LOW (ref 150–400)
RBC: 2.65 MIL/uL — ABNORMAL LOW (ref 3.87–5.11)
RDW: 11.9 % (ref 11.5–15.5)
WBC Count: 1.4 10*3/uL — ABNORMAL LOW (ref 4.0–10.5)
nRBC: 0 % (ref 0.0–0.2)

## 2019-01-03 LAB — SAMPLE TO BLOOD BANK

## 2019-01-03 LAB — PREPARE RBC (CROSSMATCH)

## 2019-01-03 MED ORDER — SODIUM CHLORIDE 0.9% FLUSH
10.0000 mL | INTRAVENOUS | Status: DC | PRN
  Administered 2019-01-03: 10 mL
  Filled 2019-01-03: qty 10

## 2019-01-03 MED ORDER — HEPARIN SOD (PORK) LOCK FLUSH 100 UNIT/ML IV SOLN
500.0000 [IU] | Freq: Once | INTRAVENOUS | Status: AC | PRN
  Administered 2019-01-03: 500 [IU]
  Filled 2019-01-03: qty 5

## 2019-01-03 MED ORDER — HEPARIN SOD (PORK) LOCK FLUSH 100 UNIT/ML IV SOLN
250.0000 [IU] | INTRAVENOUS | Status: AC | PRN
  Administered 2019-01-03: 250 [IU]
  Filled 2019-01-03: qty 5

## 2019-01-03 MED ORDER — SODIUM CHLORIDE 0.9% FLUSH
3.0000 mL | INTRAVENOUS | Status: AC | PRN
  Administered 2019-01-03: 3 mL
  Filled 2019-01-03: qty 10

## 2019-01-03 MED ORDER — FOLIC ACID 1 MG PO TABS: 1.0000 mg | ORAL_TABLET | Freq: Every day | ORAL | 2 refills | Status: DC

## 2019-01-03 MED ORDER — SODIUM CHLORIDE 0.9% IV SOLUTION
250.0000 mL | Freq: Once | INTRAVENOUS | Status: AC
  Administered 2019-01-03: 250 mL via INTRAVENOUS
  Filled 2019-01-03: qty 250

## 2019-01-03 NOTE — Telephone Encounter (Signed)
Per 6/12 schedule message (3) changed 6/15 appointment to be lab/port/injection and early AM/ confirmed with patient. Other appointments remain the same.

## 2019-01-03 NOTE — Telephone Encounter (Signed)
TC per lisa to blood bank to see when patients blood samples would expire. They let me know that samples would expire Monday at midnight. Meagan Walsh aware of date and time.

## 2019-01-03 NOTE — Patient Instructions (Signed)
Platelet Transfusion A platelet transfusion is a procedure in which you receive donated platelets through an IV. Platelets are tiny pieces of blood cells. When you get an injury, platelets clump together in the area to form a blood clot. This helps stop bleeding and is the beginning of the healing process. If you have too few platelets, your blood may have trouble clotting. This may cause you to bleed and bruise very easily. You may need a platelet transfusion if you have a condition that causes a low number of platelets (thrombocytopenia). A platelet transfusion may be used to stop or prevent excessive bleeding. Tell a health care provider about:  Any reactions you have had during previous transfusions.  Any allergies you have.  All medicines you are taking, including vitamins, herbs, eye drops, creams, and over-the-counter medicines.  Any blood disorders you have.  Any surgeries you have had.  Any medical conditions you have.  Whether you are pregnant or may be pregnant. What are the risks? Generally, this is a safe procedure. However, problems may occur, including:  Fever.  Infection.  Allergic reaction to the donor platelets.  Your body's disease-fighting system (immune system) attacking the donor platelets (hemolytic reaction). This is rare.  A rare reaction that causes lung damage (transfusion-related acute lung injury). What happens before the procedure? Medicines  Ask your health care provider about: ? Changing or stopping your regular medicines. This is especially important if you are taking diabetes medicines or blood thinners. ? Taking medicines such as aspirin and ibuprofen. These medicines can thin your blood. Do not take these medicines unless your health care provider tells you to take them. ? Taking over-the-counter medicines, vitamins, herbs, and supplements. General instructions  You will have a blood test to determine your blood type. Your blood type  determines what kind of platelets you will be given.  Follow instructions from your health care provider about eating or drinking restrictions.  If you have had an allergic reaction to a transfusion in the past, you may be given medicine to help prevent a reaction.  Your temperature, blood pressure, pulse, and breathing will be monitored. What happens during the procedure?   An IV will be inserted into one of your veins.  For your safety, two health care providers will verify your identity along with the donor platelets about to be infused.  A bag of donor platelets will be connected to your IV. The platelets will flow into your bloodstream. This usually takes 30-60 minutes.  Your temperature, blood pressure, pulse, and breathing will be monitored during the transfusion. This helps detect early signs of any reaction.  You will also be monitored for other symptoms that may indicate a reaction, including chills, hives, or itching.  If you have signs of a reaction at any time, your transfusion will be stopped, and you may be given medicine to help manage the reaction.  When your transfusion is complete, your IV will be removed.  Pressure may be applied to the IV site for a few minutes to stop any bleeding.  The IV site will be covered with a bandage (dressing). The procedure may vary among health care providers and hospitals. What happens after the procedure?  Your blood pressure, temperature, pulse, and breathing will be monitored until you leave the hospital or clinic.  You may have some bruising and soreness at your IV site. Follow these instructions at home: Medicines  Take over-the-counter and prescription medicines only as told by your health care provider.  Talk with your health care provider before you take any medicines that contain aspirin or NSAIDs. These medicines increase your risk for dangerous bleeding. General instructions  Change or remove your dressing as told  by your health care provider.  Return to your normal activities as told by your health care provider. Ask your health care provider what activities are safe for you.  Do not take baths, swim, or use a hot tub until your health care provider approves. Ask your health care provider if you may take showers.  Check your IV site every day for signs of infection. Check for: ? Redness, swelling, or pain. ? Fluid or blood. If fluid or blood drains from your IV site, use your hands to press down firmly on a bandage covering the area for a minute or two. Doing this should stop the bleeding. ? Warmth. ? Pus or a bad smell.  Keep all follow-up visits as told by your health care provider. This is important. Contact a health care provider if you have:  A headache that does not go away with medicine.  Hives, rash, or itchy skin.  Nausea or vomiting.  Unusual tiredness or weakness.  Signs of infection at your IV site. Get help right away if:  You have a fever or chills.  You urinate less often than usual.  Your urine is darker colored than normal.  You have any of the following: ? Trouble breathing. ? Pain in your back, abdomen, or chest. ? Cool, clammy skin. ? A fast heartbeat. Summary  Platelets are tiny pieces of blood cells that clump together to form a blood clot when you have an injury. If you have too few platelets, your blood may have trouble clotting.  A platelet transfusion is a procedure in which you receive donated platelets through an IV.  A platelet transfusion may be used to stop or prevent excessive bleeding.  After the procedure, check your IV site every day for signs of infection, including redness, swelling, pain, or warmth. This information is not intended to replace advice given to you by your health care provider. Make sure you discuss any questions you have with your health care provider. Document Released: 05/07/2007 Document Revised: 08/15/2017 Document  Reviewed: 08/15/2017 Elsevier Interactive Patient Education  2019 [place].  Blood Transfusion, Adult, Care After This sheet gives you information about how to care for yourself after your procedure. Your doctor may also give you more specific instructions. If you have problems or questions, contact your doctor. Follow these instructions at home:   Take over-the-counter and prescription medicines only as told by your doctor.  Go back to your normal activities as told by your doctor.  Follow instructions from your doctor about how to take care of the area where an IV tube was put into your vein (insertion site). Make sure you: ? Wash your hands with soap and water before you change your bandage (dressing). If there is no soap and water, use hand sanitizer. ? Change your bandage as told by your doctor.  Check your IV insertion site every day for signs of infection. Check for: ? More redness, swelling, or pain. ? More fluid or blood. ? Warmth. ? Pus or a bad smell. Contact a doctor if:  You have more redness, swelling, or pain around the IV insertion site.  You have more fluid or blood coming from the IV insertion site.  Your IV insertion site feels warm to the touch.  You have pus or  a bad smell coming from the IV insertion site.  Your pee (urine) turns pink, red, or brown.  You feel weak after doing your normal activities. Get help right away if:  You have signs of a serious allergic or body defense (immune) system reaction, including: ? Itchiness. ? Hives. ? Trouble breathing. ? Anxiety. ? Pain in your chest or lower back. ? Fever, flushing, and chills. ? Fast pulse. ? Rash. ? Watery poop (diarrhea). ? Throwing up (vomiting). ? Dark pee. ? Serious headache. ? Dizziness. ? Stiff neck. ? Yellow color in your face or the white parts of your eyes (jaundice). Summary  After a blood transfusion, return to your normal activities as told by your doctor.  Every  day, check for signs of infection where the IV tube was put into your vein.  Some signs of infection are warm skin, more redness and pain, more fluid or blood, and pus or a bad smell where the needle went in.  Contact your doctor if you feel weak or have any unusual symptoms. This information is not intended to replace advice given to you by your health care provider. Make sure you discuss any questions you have with your health care provider. Document Released: 07/31/2014 Document Revised: 03/03/2016 Document Reviewed: 03/03/2016 Elsevier Interactive Patient Education  [employer].

## 2019-01-03 NOTE — Telephone Encounter (Signed)
Appointments scheduled and cancelled per 6/12 los and 6/12 schedule message.   Spoke with patient and per patient there may be some discrepancies in schedule/orders - she is in infusion and will speak with [facility] while she is still there.

## 2019-01-03 NOTE — Telephone Encounter (Signed)
Received phone call from Oncologist wanting the okay to fully [place] for [place]. Per Dr. Clovis Pu this is okay. Advised that pt. May get nervous or jittery from stopping the Lamictal and to call the office if it is intolerable.

## 2019-01-03 NOTE — Progress Notes (Signed)
Confirmed with blood bank they have a sample to be sent out to crossmatch platelets for 01/06/19.

## 2019-01-03 NOTE — Addendum Note (Signed)
Addended by: Owens Shark on: 01/03/2019 03:22 PM   Modules accepted: Orders

## 2019-01-03 NOTE — Progress Notes (Addendum)
[facility] OFFICE PROGRESS NOTE   Diagnosis: CLL  INTERVAL HISTORY:   Meagan Walsh returns as scheduled.  She was transfused platelets 01/01/2019.  She denies bleeding.  No fever or cough.  She has dyspnea on exertion.  The right axillary lymph node is "less bothersome".  She will complete the Lamictal taper on Sunday.  Objective:  Vital signs in last 24 hours:  Blood pressure (!) 99/53, pulse 86, temperature 98.7 F (37.1 C), resp. rate 17, height '5\' 6"'  (1.676 m), weight 129 lb 14.4 oz (58.9 kg), SpO2 96 %.    HEENT: 4 tiny ecchymoses at the left buccal mucosa. Lymphatics: Stable 1 to 1-1/2 cm soft mobile right axillary lymph node.  Tiny right inguinal lymph node. Vascular: No leg edema. Left upper extremity PICC is without erythema.  Lab Results:  Lab Results  Component Value Date   WBC 1.4 (L) 01/03/2019   HGB 7.7 (L) 01/03/2019   HCT 23.6 (L) 01/03/2019   MCV 89.1 01/03/2019   PLT 14 (L) 01/03/2019   NEUTROABS PENDING 01/03/2019    Imaging:  No results found.  Medications: I have reviewed the patient's current medications.  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 Mild spinal megaly.  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, no evidence of p53 (17p13) deletion, no evidence of 11q22deletion  Bone marrow biopsy 02/26/2018-hypercellular marrow with extensive involvement by CLL, lymphocytes represent85% of all cells  Ibrutinib initiated 04/03/2018  Ibrutinib placed on hold 04/11/2018 due to onset of arthralgias  Ibrutinib resumed 04/16/2018, discontinued 04/25/2018 secondary to severe arthralgias/arthritis  Ibrutinib resumed at a dose of 140 mg daily 05/03/2018  Ibrutinib dose adjusted to 140 mg alternating with 229m 06/25/2018  Ibrutinib discontinued 07/03/2018 secondary to severe arthralgias  Acalabrutinib 08/16/2018, discontinued 11/15/2018 secondary to persistent severe transfusion dependent anemia and neutropenia/thrombocytopenia, last dose 11/14/2018  Bone marrow biopsy 11/21/2018- decreased cellularity, involvement by CLL, decreased erythroid and granulocytic precursors, decreased megakaryocytes  Cycle 1 rituximab 12/06/2018  Bone marrow biopsy 12/24/2018 at [facility]-hypocellular bone marrow (10%) involved by CLL, representing 50% of marrow cellularity; markedly decreased trilineage hematopoiesis including essentially absent erythropoiesis  2.Hypothyroidism 3.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 Augmentin prescribed. 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 Pancytopenia secondary to CLL and a hypocellular bone marrow  G-CSF and Nplate started 54/85/4627 G-CSF changed to daily beginning 12/17/2018; G-CSF discontinued  12/31/2018   Disposition: Meagan Walsh unchanged.  She continues to have severe pancytopenia.  She is symptomatic from the anemia.  She will receive 2 units of blood today.  She will return for lab, Nplate and possible platelets on 01/06/2019.  She will return for lab and follow-up on 01/08/2019.  We will continue PICC care Monday Wednesday Friday.  Patient seen with Dr. Benay Spice.  25 minutes were spent face-to-face at today's visit with the majority of that time involved in counseling/coordination of care.  Ned Card ANP/GNP-BC   01/03/2019  11:20 AM This was a shared visit with Ned Card.  Meagan Walsh has persistent severe pancytopenia.  The pancytopenia is secondary to bone marrow failure related to CLL, potential drug toxicity, and possible aplastic anemia.  She will receive packed red blood cells and platelets today.  She continues prophylactic antibiotic support and nplate.  I communicated with Dr. Prince Solian earlier this week and again today.  The plan is to begin a trial of prednisone when she is off of Lamictal.  She does not have symptoms related to withdrawal of the Lamictal.  We will stop the Lamictal now.  Dr. Prince Solian recommends a trial of prednisone beginning approximately 1 week after the Lamictal is stopped.  She also recommends continuing monthly rituximab.  We will prescribe folic acid.  Meagan Walsh will return for an office and lab visit on 01/08/2019.  [name], MD

## 2019-01-03 NOTE — Telephone Encounter (Signed)
Per Patrica Duel to Dr. Casimiro Needle office to see if it's ok for patient to go ahead and stop taking Lamictal medication that he currently had her weaning off of. Spoke with Dr. Casimiro Needle nurse Crystal who stated that Dr. Clovis Pu said that it would be ok for patient to go ahead and stop medication and that if she starts to feel glittery or nervous to just give him a call. Patient aware.

## 2019-01-04 ENCOUNTER — Inpatient Hospital Stay: Payer: BC Managed Care – PPO

## 2019-01-04 LAB — TYPE AND SCREEN
ABO/RH(D): O POS
Antibody Screen: NEGATIVE
Unit division: 0
Unit division: 0

## 2019-01-04 LAB — BPAM RBC
Blood Product Expiration Date: 202007102359
Blood Product Expiration Date: 202007102359
ISSUE DATE / TIME: 202006121313
ISSUE DATE / TIME: 202006121313
Unit Type and Rh: 5100
Unit Type and Rh: 5100

## 2019-01-04 LAB — BPAM PLATELET PHERESIS
Blood Product Expiration Date: 202006132359
ISSUE DATE / TIME: 202006121246
Unit Type and Rh: 5100

## 2019-01-04 LAB — PREPARE PLATELET PHERESIS: Unit division: 0

## 2019-01-04 MED ORDER — TBO-FILGRASTIM 300 MCG/0.5ML ~~LOC~~ SOSY
PREFILLED_SYRINGE | SUBCUTANEOUS | Status: AC
  Filled 2019-01-04: qty 0.5

## 2019-01-06 ENCOUNTER — Inpatient Hospital Stay: Payer: BC Managed Care – PPO

## 2019-01-06 ENCOUNTER — Other Ambulatory Visit: Payer: Self-pay | Admitting: *Deleted

## 2019-01-06 ENCOUNTER — Ambulatory Visit: Payer: BC Managed Care – PPO

## 2019-01-06 ENCOUNTER — Other Ambulatory Visit: Payer: Self-pay

## 2019-01-06 DIAGNOSIS — R0602 Shortness of breath: Secondary | ICD-10-CM | POA: Diagnosis not present

## 2019-01-06 DIAGNOSIS — D61818 Other pancytopenia: Secondary | ICD-10-CM | POA: Diagnosis not present

## 2019-01-06 DIAGNOSIS — I341 Nonrheumatic mitral (valve) prolapse: Secondary | ICD-10-CM | POA: Diagnosis not present

## 2019-01-06 DIAGNOSIS — J189 Pneumonia, unspecified organism: Secondary | ICD-10-CM | POA: Diagnosis not present

## 2019-01-06 DIAGNOSIS — G4700 Insomnia, unspecified: Secondary | ICD-10-CM | POA: Diagnosis not present

## 2019-01-06 DIAGNOSIS — C9110 Chronic lymphocytic leukemia of B-cell type not having achieved remission: Secondary | ICD-10-CM

## 2019-01-06 DIAGNOSIS — D801 Nonfamilial hypogammaglobulinemia: Secondary | ICD-10-CM | POA: Diagnosis not present

## 2019-01-06 DIAGNOSIS — N6331 Unspecified lump in axillary tail of the right breast: Secondary | ICD-10-CM | POA: Diagnosis not present

## 2019-01-06 DIAGNOSIS — E039 Hypothyroidism, unspecified: Secondary | ICD-10-CM | POA: Diagnosis not present

## 2019-01-06 DIAGNOSIS — Z79899 Other long term (current) drug therapy: Secondary | ICD-10-CM | POA: Diagnosis not present

## 2019-01-06 DIAGNOSIS — R161 Splenomegaly, not elsewhere classified: Secondary | ICD-10-CM | POA: Diagnosis not present

## 2019-01-06 DIAGNOSIS — D696 Thrombocytopenia, unspecified: Secondary | ICD-10-CM

## 2019-01-06 DIAGNOSIS — F329 Major depressive disorder, single episode, unspecified: Secondary | ICD-10-CM | POA: Diagnosis not present

## 2019-01-06 DIAGNOSIS — M542 Cervicalgia: Secondary | ICD-10-CM | POA: Diagnosis not present

## 2019-01-06 LAB — CBC WITH DIFFERENTIAL (CANCER CENTER ONLY)
Abs Immature Granulocytes: 0 10*3/uL (ref 0.00–0.07)
Basophils Absolute: 0 10*3/uL (ref 0.0–0.1)
Basophils Relative: 0 %
Eosinophils Absolute: 0 10*3/uL (ref 0.0–0.5)
Eosinophils Relative: 0 %
HCT: 33.9 % — ABNORMAL LOW (ref 36.0–46.0)
Hemoglobin: 11.7 g/dL — ABNORMAL LOW (ref 12.0–15.0)
Lymphocytes Relative: 80 %
Lymphs Abs: 2.2 10*3/uL (ref 0.7–4.0)
MCH: 29.6 pg (ref 26.0–34.0)
MCHC: 34.5 g/dL (ref 30.0–36.0)
MCV: 85.8 fL (ref 80.0–100.0)
Monocytes Absolute: 0 10*3/uL — ABNORMAL LOW (ref 0.1–1.0)
Monocytes Relative: 0 %
Neutro Abs: 0.6 10*3/uL — ABNORMAL LOW (ref 1.7–17.7)
Neutrophils Relative %: 20 %
Platelet Count: 20 10*3/uL — ABNORMAL LOW (ref 150–400)
RBC: 3.95 MIL/uL (ref 3.87–5.11)
RDW: 11.9 % (ref 11.5–15.5)
WBC Count: 2.8 10*3/uL — ABNORMAL LOW (ref 4.0–10.5)
nRBC: 0 % (ref 0.0–0.2)

## 2019-01-06 MED ORDER — SODIUM CHLORIDE 0.9% FLUSH
10.0000 mL | INTRAVENOUS | Status: DC | PRN
  Administered 2019-01-06: 10 mL
  Filled 2019-01-06: qty 10

## 2019-01-06 MED ORDER — HEPARIN SOD (PORK) LOCK FLUSH 100 UNIT/ML IV SOLN
500.0000 [IU] | Freq: Once | INTRAVENOUS | Status: AC | PRN
  Administered 2019-01-06: 250 [IU]
  Filled 2019-01-06: qty 5

## 2019-01-06 MED ORDER — TBO-FILGRASTIM 300 MCG/0.5ML ~~LOC~~ SOSY: 300.0000 ug | PREFILLED_SYRINGE | Freq: Once | SUBCUTANEOUS | Status: DC

## 2019-01-06 MED ORDER — ROMIPLOSTIM 250 MCG ~~LOC~~ SOLR
4.0000 ug/kg | Freq: Once | SUBCUTANEOUS | Status: AC
  Administered 2019-01-06: 235 ug via SUBCUTANEOUS
  Filled 2019-01-06: qty 0.47

## 2019-01-06 NOTE — Progress Notes (Signed)
Per Dr. Benay Spice to keep dose of Nplate at 67mcg/kg (373HDI) for dose today.  Jalene Mullet, PharmD PGY2 Hematology/ Oncology Pharmacy Resident 01/06/2019 10:24 AM

## 2019-01-06 NOTE — Patient Instructions (Signed)
Romiplostim injection What is this medicine? ROMIPLOSTIM (roe mi PLOE stim) helps your body make more platelets. This medicine is used to treat low platelets caused by chronic idiopathic thrombocytopenic purpura (ITP). This medicine may be used for other purposes; ask your health care provider or pharmacist if you have questions. COMMON BRAND NAME(S): Nplate What should I tell my health care provider before I take this medicine? They need to know if you have any of these conditions: -bleeding disorders -bone marrow problem, like blood cancer or myelodysplastic syndrome -history of blood clots -liver disease -surgery to remove your spleen -an unusual or allergic reaction to romiplostim, mannitol, other medicines, foods, dyes, or preservatives -pregnant or trying to get pregnant -breast-feeding How should I use this medicine? This medicine is for injection under the skin. It is given by a health care professional in a hospital or clinic setting. A special MedGuide will be given to you before your injection. Read this information carefully each time. Talk to your pediatrician regarding the use of this medicine in children. While this drug may be prescribed for children as young as 1 year for selected conditions, precautions do apply. Overdosage: If you think you have taken too much of this medicine contact a poison control center or emergency room at once. NOTE: This medicine is only for you. Do not share this medicine with others. What if I miss a dose? It is important not to miss your dose. Call your doctor or health care professional if you are unable to keep an appointment. What may interact with this medicine? Interactions are not expected. This list may not describe all possible interactions. Give your health care provider a list of all the medicines, herbs, non-prescription drugs, or dietary supplements you use. Also tell them if you smoke, drink alcohol, or use illegal drugs. Some items  may interact with your medicine. What should I watch for while using this medicine? Your condition will be monitored carefully while you are receiving this medicine. Visit your prescriber or health care professional for regular checks on your progress and for the needed blood tests. It is important to keep all appointments. What side effects may I notice from receiving this medicine? Side effects that you should report to your doctor or health care professional as soon as possible: -allergic reactions like skin rash, itching or hives, swelling of the face, lips, or tongue -signs and symptoms of bleeding such as bloody or black, tarry stools; red or dark brown urine; spitting up blood or brown material that looks like coffee grounds; red spots on the skin; unusual bruising or bleeding from the eyes, gums, or nose -signs and symptoms of a blood clot such as chest pain; shortness of breath; pain, swelling, or warmth in the leg -signs and symptoms of a stroke like changes in vision; confusion; trouble speaking or understanding; severe headaches; sudden numbness or weakness of the face, arm or leg; trouble walking; dizziness; loss of balance or coordination Side effects that usually do not require medical attention (report to your doctor or health care professional if they continue or are bothersome): -headache -pain in arms and legs -pain in mouth -stomach pain This list may not describe all possible side effects. Call your doctor for medical advice about side effects. You may report side effects to FDA at 1-800-FDA-1088. Where should I keep my medicine? This drug is given in a hospital or clinic and will not be stored at home. NOTE: This sheet is a summary. It may not   cover all possible information. If you have questions about this medicine, talk to your doctor, pharmacist, or health care provider.  2019 Elsevier/Gold Standard (2017-07-09 11:10:55)  

## 2019-01-06 NOTE — Progress Notes (Signed)
Pt. came in today for PICC flush, granix released in error. Pt. Stated she is not receiving granix anymore. Per Dr. Benay Spice.

## 2019-01-07 ENCOUNTER — Inpatient Hospital Stay: Payer: BC Managed Care – PPO

## 2019-01-07 NOTE — Progress Notes (Addendum)
[place]   [phone] [phone]   Clinic Follow up Note   Patient Care Team: Lavone Orn, MD as PCP - General (Internal Medicine) 01/08/2019  CHIEF COMPLAINT: CLL  INTERVAL HISTORY: Ms. Pellicano returns for follow up as scheduled. Since her last visit on 01/03/19 she received 2 units RBCs and 1 unit of platelets on 6/12, and Nplate as scheduled. Dyspnea on exertion and energy level improve after blood transfusion. She feels well today. She continues prophylactic antibiotics and antiviral. Denies fever or chills. Last GCSF on 12/30/18. She has scattered bruising to abdomen, shoulder, legs where she has scratched or something rubs against her. Denies GI/GU or oral bleeding or epistaxis. She cannot feel her axillary lymph node anymore. Last dose of lamictal was 6/12. She was feeling slightly "edgy" lately and took anti-anxiety medication, symptoms resolved.    MEDICAL HISTORY:  Past Medical History:  Diagnosis Date   Anxiety    Cancer ([place])    CLL   Degenerative disk disease    Neck--chronic pain   Depression    Fibroids    Uterine   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   Hypothyroidism    Lymphocytosis    Mitral valve prolapse    Mitral valve prolapse    Thyroid disease    Hypothyroid    SURGICAL HISTORY: Past Surgical History:  Procedure Laterality Date   BREAST BIOPSY Left 2000   CESAREAN SECTION  1996   MELANOMA EXCISION WITH SENTINEL LYMPH NODE BIOPSY Left 10/01/2017   Procedure: EXCISIONAL BIOPSY OF LEFT INGUINAL LYMPH NODE;  Surgeon: Greer Pickerel, MD;  Location: [facility];  Service: General;  Laterality: Left;   REVISION OF ABDOMINAL SCAR     TONSILLECTOMY     age 64    I have reviewed the social history and family history with the patient and they are unchanged from previous note.  ALLERGIES:  has No Known Allergies.  MEDICATIONS:  Current Outpatient Medications  Medication Sig  Dispense Refill   acetaminophen (TYLENOL) 325 MG tablet Take 650 mg by mouth every 6 (six) hours as needed.     ALPRAZolam (XANAX) 0.25 MG tablet TAKE 1 TO 2 TABLETS BY MOUTH EVERY DAY AS NEEDED FOR ANXIETY 30 tablet 1   aminocaproic acid (AMICAR) 500 MG tablet Take 2 tablets (1,000 mg total) by mouth every 8 (eight) hours. 180 tablet 1   fluconazole (DIFLUCAN) 100 MG tablet Take 1 tablet (100 mg total) by mouth daily. 30 tablet 0   folic acid (FOLVITE) 1 MG tablet Take 1 tablet (1 mg total) by mouth daily. 30 tablet 2   Ibuprofen 200 MG CAPS Take 400 mg by mouth as needed.     levofloxacin (LEVAQUIN) 500 MG tablet Take 1 tablet (500 mg total) by mouth daily. 30 tablet 0   levonorgestrel (MIRENA) 20 MCG/24HR IUD 1 each by Intrauterine route once.      levothyroxine (SYNTHROID, LEVOTHROID) 100 MCG tablet Take 100 mcg by mouth daily.     lithium carbonate (LITHOBID) 300 MG CR tablet Take 2 tablets (600 mg total) by mouth at bedtime. 180 tablet 0   traMADol (ULTRAM) 50 MG tablet TAKE 1 TABLET BY MOUTH EVERY 8 HOURS AS NEEDED 20 tablet 0   valACYclovir (VALTREX) 500 MG tablet Take 1 tablet (500 mg total) by mouth 2 (two) times daily. 60 tablet 0   vortioxetine HBr (TRINTELLIX) 20 MG TABS tablet Take 1 tablet (20 mg  total) by mouth daily. 90 tablet 0   zolpidem (AMBIEN) 10 MG tablet TAKE 1/2 TO 1 TABLET BY MOUTH AS NEEDED FOR INSOMNIA. 30 tablet 5   No current facility-administered medications for this visit.     PHYSICAL EXAMINATION: ECOG PERFORMANCE STATUS: 1 - Symptomatic but completely ambulatory  Vitals:   01/08/19 0931  BP: 108/62  Pulse: 84  Resp: 16  Temp: (!) 97.4 F (36.3 C)  SpO2: 100%   Filed Weights   01/08/19 0931  Weight: 131 lb 1.6 oz (59.5 kg)    GENERAL:alert, no distress and comfortable SKIN: dry skin with mild tenting at the arms bilaterally. Scattered petechiae  EYES:  sclera clear LYMPH:  no palpable cervical or supraclavicular lymphadenopathy.  Tiny stable right inguinal node. Right axillary node has decreased in size LUNGS: respirations even and unlabored  HEART: no lower extremity edema ABDOMEN:abdomen flat Musculoskeletal:no cyanosis of digits  NEURO: alert & oriented x 3 with fluent speech, normal gait  LUE PICC without erythema    LABORATORY DATA:  I have reviewed the data as listed CBC Latest Ref Rng & Units 01/08/2019 01/06/2019 01/03/2019  WBC 4.0 - 10.5 K/uL 3.4(L) 2.8(L) 1.4(L)  Hemoglobin 12.0 - 15.0 g/dL 11.4(L) 11.7(L) 7.7(L)  Hematocrit 36.0 - 46.0 % 33.9(L) 33.9(L) 23.6(L)  Platelets 150 - 400 K/uL 13(L) 20(L) 14(L)     CMP Latest Ref Rng & Units 12/27/2018 12/20/2018 12/02/2018  Glucose 70 - 99 mg/dL 102(H) 115(H) 112(H)  BUN 6 - 20 mg/dL 20 22(H) 19  Creatinine 0.44 - 1.00 mg/dL 0.78 0.81 0.94  Sodium 135 - 145 mmol/L 143 141 142  Potassium 3.5 - 5.1 mmol/L 4.4 4.1 4.2  Chloride 98 - 111 mmol/L 109 108 106  CO2 22 - 32 mmol/L '27 27 28  ' Calcium 8.9 - 10.3 mg/dL 9.6 9.6 9.8  Total Protein 6.5 - 8.1 g/dL 6.3(L) 6.3(L) 6.4(L)  Total Bilirubin 0.3 - 1.2 mg/dL 0.6 0.6 0.5  Alkaline Phos 38 - 126 U/L 103 111 89  AST 15 - 41 U/L 13(L) 12(L) 18  ALT 0 - 44 U/L '18 14 29      ' RADIOGRAPHIC STUDIES: I have personally reviewed the radiological images as listed and agreed with the findings in the report. No results found.   ASSESSMENT & PLAN:  1.CLL-diagnosed in August 2010, flow cytometry consistent with CLL Enlarged leftinguinal lymph node January 2019,smallneck/axillary nodes and palpable splenomegaly 09/11/2017  CTson 09/17/2017-3 cm necrotic appearing lymph node in the left inguinal region, borderline enlarged pelvic/retroperitoneal, chest, and axillary nodes. Mild splenomegaly.  Ultrasound-guided biopsy of the left inguinal lymph node 09/18/2017, slightly "purulent "fluid aspirated, core biopsy is consistent with an atypical lymphoid proliferation-extensive necrosis with surrounding epithelioid  histiocytes, limited intact lymphoid tissue involved with CLL  Incisional biopsy of a necrotic/purulent left inguinal lymph node on 10/01/2017-extensive necrosis with granulomatous inflammation, small amount of viable lymphoid tissue involved with CLL, AFB and fungal stains negative  Peripheral blood FISH analysis 02/05/2018-deletion 13q14, no evidence of p53 (17p13) deletion, no evidence of 11q22deletion  Bone marrow biopsy 02/26/2018-hypercellular marrow with extensive involvement by CLL, lymphocytes represent85% of all cells  Ibrutinib initiated 04/03/2018  Ibrutinib placed on hold 04/11/2018 due to onset of arthralgias  Ibrutinib resumed 04/16/2018, discontinued 04/25/2018 secondary to severe arthralgias/arthritis  Ibrutinib resumed at a dose of 140 mg daily 05/03/2018  Ibrutinib dose adjusted to 140 mg alternating with 265m 06/25/2018  Ibrutinib discontinued 07/03/2018 secondary to severe arthralgias  Acalabrutinib 08/16/2018, discontinued 11/15/2018 secondary to  persistent severe transfusion dependent anemia and neutropenia/thrombocytopenia, last dose 11/14/2018  Bone marrow biopsy 11/21/2018-decreased cellularity, involvement by CLL, decreased erythroid and granulocytic precursors, decreased megakaryocytes  Cycle 1 rituximab 12/06/2018  Bone marrow biopsy 12/24/2018 at [facility]-hypocellular bone marrow (10%) involved by CLL, representing 50% of marrow cellularity; markedly decreased trilineage hematopoiesis including essentially absent erythropoiesis  2.Hypothyroidism 3.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 Augmentin prescribed. 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 Pancytopenia secondary to CLL and a hypocellular bone marrow  G-CSF and Nplate started 9/76/7341, G-CSF changed to daily beginning 12/17/2018; G-CSF discontinued 12/31/2018    [name] appears well today. Her CBC is encouraging; Hgb improved to 11.7 after blood transfusion and remains stable at 11.4 today. PLT 13K, also fairly stable. WBC improved to 3.4, ANC stabilized at 0.6. She does not need transfusions today. Dr. Benay Spice discussed if her [place] is consistently >500 with next lab, may consider discontinuing prophylactic antimicrobials. Dr. Benay Spice plans to f/u with Dr. Prince Solian on continuing Rituxan and molecular studies from recent bone marrow biopsy which were pending. Her next f/u with her is in 2 weeks. She remains off lamictal, mood was slightly edgy this week but resolved with xanax PRN.   Ms. Darty will continue daily folic acid; she will continue weekly Nplate for now. She will continue lab and PICC care MWF. She will return for office visit on 6/24.   All questions were answered. The patient knows to call the clinic with any problems, questions or concerns. No barriers to learning was detected.    Alla Feeling, NP 01/08/19   This was a shared visit with Hilma Favors.  Ms. Schadler was interviewed and examined.  The neutrophil count has been slightly higher this week and the platelet count has remained at over 10,000 after last receiving platelets on 01/03/2019.  The plan is to continue antibiotic prophylaxis and folic acid.  She continues weekly Nplate.  I will communicate with Dr. Prince Solian today regarding the indication for rituximab and prednisone therapy.  Her husband was present for our discussion by telephone.  [name], MD

## 2019-01-08 ENCOUNTER — Inpatient Hospital Stay: Payer: BC Managed Care – PPO

## 2019-01-08 ENCOUNTER — Inpatient Hospital Stay (HOSPITAL_BASED_OUTPATIENT_CLINIC_OR_DEPARTMENT_OTHER): Payer: BC Managed Care – PPO | Admitting: Nurse Practitioner

## 2019-01-08 ENCOUNTER — Telehealth: Payer: Self-pay | Admitting: Nurse Practitioner

## 2019-01-08 ENCOUNTER — Encounter: Payer: Self-pay | Admitting: Nurse Practitioner

## 2019-01-08 ENCOUNTER — Other Ambulatory Visit: Payer: Self-pay

## 2019-01-08 VITALS — BP 108/62 | HR 84 | Temp 97.4°F | Resp 16 | Ht 66.0 in | Wt 131.1 lb

## 2019-01-08 DIAGNOSIS — D801 Nonfamilial hypogammaglobulinemia: Secondary | ICD-10-CM | POA: Diagnosis not present

## 2019-01-08 DIAGNOSIS — F329 Major depressive disorder, single episode, unspecified: Secondary | ICD-10-CM | POA: Diagnosis not present

## 2019-01-08 DIAGNOSIS — I341 Nonrheumatic mitral (valve) prolapse: Secondary | ICD-10-CM | POA: Diagnosis not present

## 2019-01-08 DIAGNOSIS — C9110 Chronic lymphocytic leukemia of B-cell type not having achieved remission: Secondary | ICD-10-CM | POA: Diagnosis not present

## 2019-01-08 DIAGNOSIS — N6331 Unspecified lump in axillary tail of the right breast: Secondary | ICD-10-CM

## 2019-01-08 DIAGNOSIS — M542 Cervicalgia: Secondary | ICD-10-CM

## 2019-01-08 DIAGNOSIS — R0602 Shortness of breath: Secondary | ICD-10-CM | POA: Diagnosis not present

## 2019-01-08 DIAGNOSIS — E039 Hypothyroidism, unspecified: Secondary | ICD-10-CM

## 2019-01-08 DIAGNOSIS — D61818 Other pancytopenia: Secondary | ICD-10-CM

## 2019-01-08 DIAGNOSIS — J189 Pneumonia, unspecified organism: Secondary | ICD-10-CM | POA: Diagnosis not present

## 2019-01-08 DIAGNOSIS — Z79899 Other long term (current) drug therapy: Secondary | ICD-10-CM

## 2019-01-08 DIAGNOSIS — R161 Splenomegaly, not elsewhere classified: Secondary | ICD-10-CM | POA: Diagnosis not present

## 2019-01-08 DIAGNOSIS — D696 Thrombocytopenia, unspecified: Secondary | ICD-10-CM

## 2019-01-08 DIAGNOSIS — G4700 Insomnia, unspecified: Secondary | ICD-10-CM | POA: Diagnosis not present

## 2019-01-08 LAB — CBC WITH DIFFERENTIAL (CANCER CENTER ONLY)
Abs Immature Granulocytes: 0.01 10*3/uL (ref 0.00–0.07)
Basophils Absolute: 0 10*3/uL (ref 0.0–0.1)
Basophils Relative: 0 %
Eosinophils Absolute: 0 10*3/uL (ref 0.0–0.5)
Eosinophils Relative: 0 %
HCT: 33.9 % — ABNORMAL LOW (ref 36.0–46.0)
Hemoglobin: 11.4 g/dL — ABNORMAL LOW (ref 12.0–15.0)
Immature Granulocytes: 0 %
Lymphocytes Relative: 81 %
Lymphs Abs: 2.8 10*3/uL (ref 0.7–4.0)
MCH: 28.9 pg (ref 26.0–34.0)
MCHC: 33.6 g/dL (ref 30.0–36.0)
MCV: 85.8 fL (ref 80.0–100.0)
Monocytes Absolute: 0.1 10*3/uL (ref 0.1–1.0)
Monocytes Relative: 2 %
Neutro Abs: 0.6 10*3/uL — ABNORMAL LOW (ref 1.7–7.7)
Neutrophils Relative %: 17 %
Platelet Count: 13 10*3/uL — ABNORMAL LOW (ref 150–400)
RBC: 3.95 MIL/uL (ref 3.87–5.11)
RDW: 11.7 % (ref 11.5–15.5)
WBC Count: 3.4 10*3/uL — ABNORMAL LOW (ref 4.0–10.5)
nRBC: 0 % (ref 0.0–0.2)

## 2019-01-08 LAB — SAMPLE TO BLOOD BANK

## 2019-01-08 MED ORDER — SODIUM CHLORIDE 0.9% FLUSH
10.0000 mL | INTRAVENOUS | Status: DC | PRN
  Administered 2019-01-08: 10 mL
  Filled 2019-01-08: qty 10

## 2019-01-08 MED ORDER — HEPARIN SOD (PORK) LOCK FLUSH 100 UNIT/ML IV SOLN
500.0000 [IU] | Freq: Once | INTRAVENOUS | Status: AC | PRN
  Administered 2019-01-08: 500 [IU]
  Filled 2019-01-08: qty 5

## 2019-01-08 NOTE — Telephone Encounter (Signed)
I spoke to patient per Dr. Benay Spice will cancel Rituxan infusion on 6/19. If counts are stable, or not much improved, will start prednisone at 60 mg per day. No significant findings yet on bone marrow bx as of now, per Dr. Prince Solian. I notified Arbie Cookey in pharmacy to cancel Rituxan. Will hold infusion appt in event she needs RBC or plt transfusion on 6/19. Patient understands and appreciates the call.  Cira Rue, NP  01/08/19

## 2019-01-08 NOTE — Telephone Encounter (Signed)
Scheduled appt per 6/17 los. Called patient and patient aware of appt date and time.  Waiting to hear back from the provider for 6/24 appts

## 2019-01-09 ENCOUNTER — Ambulatory Visit: Payer: BC Managed Care – PPO

## 2019-01-10 ENCOUNTER — Other Ambulatory Visit: Payer: Self-pay | Admitting: Nurse Practitioner

## 2019-01-10 ENCOUNTER — Inpatient Hospital Stay: Payer: BC Managed Care – PPO

## 2019-01-10 ENCOUNTER — Other Ambulatory Visit: Payer: Self-pay | Admitting: *Deleted

## 2019-01-10 ENCOUNTER — Other Ambulatory Visit: Payer: Self-pay

## 2019-01-10 ENCOUNTER — Telehealth: Payer: Self-pay | Admitting: Oncology

## 2019-01-10 ENCOUNTER — Other Ambulatory Visit: Payer: BC Managed Care – PPO

## 2019-01-10 ENCOUNTER — Ambulatory Visit: Payer: BC Managed Care – PPO

## 2019-01-10 ENCOUNTER — Telehealth: Payer: Self-pay | Admitting: *Deleted

## 2019-01-10 DIAGNOSIS — C9110 Chronic lymphocytic leukemia of B-cell type not having achieved remission: Secondary | ICD-10-CM

## 2019-01-10 DIAGNOSIS — D696 Thrombocytopenia, unspecified: Secondary | ICD-10-CM

## 2019-01-10 DIAGNOSIS — R161 Splenomegaly, not elsewhere classified: Secondary | ICD-10-CM | POA: Diagnosis not present

## 2019-01-10 DIAGNOSIS — Z79899 Other long term (current) drug therapy: Secondary | ICD-10-CM | POA: Diagnosis not present

## 2019-01-10 DIAGNOSIS — D61818 Other pancytopenia: Secondary | ICD-10-CM | POA: Diagnosis not present

## 2019-01-10 DIAGNOSIS — E039 Hypothyroidism, unspecified: Secondary | ICD-10-CM | POA: Diagnosis not present

## 2019-01-10 DIAGNOSIS — I341 Nonrheumatic mitral (valve) prolapse: Secondary | ICD-10-CM | POA: Diagnosis not present

## 2019-01-10 DIAGNOSIS — D801 Nonfamilial hypogammaglobulinemia: Secondary | ICD-10-CM | POA: Diagnosis not present

## 2019-01-10 DIAGNOSIS — M542 Cervicalgia: Secondary | ICD-10-CM | POA: Diagnosis not present

## 2019-01-10 DIAGNOSIS — G4700 Insomnia, unspecified: Secondary | ICD-10-CM | POA: Diagnosis not present

## 2019-01-10 DIAGNOSIS — R0602 Shortness of breath: Secondary | ICD-10-CM | POA: Diagnosis not present

## 2019-01-10 DIAGNOSIS — N6331 Unspecified lump in axillary tail of the right breast: Secondary | ICD-10-CM | POA: Diagnosis not present

## 2019-01-10 DIAGNOSIS — J189 Pneumonia, unspecified organism: Secondary | ICD-10-CM | POA: Diagnosis not present

## 2019-01-10 DIAGNOSIS — F329 Major depressive disorder, single episode, unspecified: Secondary | ICD-10-CM | POA: Diagnosis not present

## 2019-01-10 LAB — CBC WITH DIFFERENTIAL (CANCER CENTER ONLY)
Abs Immature Granulocytes: 0 10*3/uL (ref 0.00–0.07)
Basophils Absolute: 0 10*3/uL (ref 0.0–0.1)
Basophils Relative: 0 %
Eosinophils Absolute: 0 10*3/uL (ref 0.0–0.5)
Eosinophils Relative: 0 %
HCT: 31.1 % — ABNORMAL LOW (ref 36.0–46.0)
Hemoglobin: 10.4 g/dL — ABNORMAL LOW (ref 12.0–15.0)
Immature Granulocytes: 0 %
Lymphocytes Relative: 84 %
Lymphs Abs: 2.7 10*3/uL (ref 0.7–4.0)
MCH: 28.7 pg (ref 26.0–34.0)
MCHC: 33.4 g/dL (ref 30.0–36.0)
MCV: 85.9 fL (ref 80.0–100.0)
Monocytes Absolute: 0.1 10*3/uL (ref 0.1–1.0)
Monocytes Relative: 2 %
Neutro Abs: 0.5 10*3/uL — ABNORMAL LOW (ref 1.7–7.7)
Neutrophils Relative %: 14 %
Platelet Count: 7 10*3/uL — CL (ref 150–400)
RBC: 3.62 MIL/uL — ABNORMAL LOW (ref 3.87–5.11)
RDW: 11.7 % (ref 11.5–15.5)
WBC Count: 3.2 10*3/uL — ABNORMAL LOW (ref 4.0–10.5)
nRBC: 0 % (ref 0.0–0.2)

## 2019-01-10 LAB — TYPE AND SCREEN
ABO/RH(D): O POS
Antibody Screen: NEGATIVE

## 2019-01-10 LAB — PREPARE RBC (CROSSMATCH)

## 2019-01-10 MED ORDER — HEPARIN SOD (PORK) LOCK FLUSH 100 UNIT/ML IV SOLN
500.0000 [IU] | Freq: Once | INTRAVENOUS | Status: AC | PRN
  Administered 2019-01-10: 250 [IU]
  Filled 2019-01-10: qty 5

## 2019-01-10 MED ORDER — PREDNISONE 20 MG PO TABS: 60.0000 mg | ORAL_TABLET | Freq: Every day | ORAL | 0 refills | Status: DC

## 2019-01-10 MED ORDER — SODIUM CHLORIDE 0.9% FLUSH
10.0000 mL | INTRAVENOUS | Status: DC | PRN
  Administered 2019-01-10: 10 mL
  Filled 2019-01-10: qty 10

## 2019-01-10 MED ORDER — HEPARIN SOD (PORK) LOCK FLUSH 100 UNIT/ML IV SOLN
250.0000 [IU] | INTRAVENOUS | Status: AC | PRN
  Administered 2019-01-10: 250 [IU]
  Filled 2019-01-10: qty 5

## 2019-01-10 MED ORDER — SODIUM CHLORIDE 0.9% IV SOLUTION
250.0000 mL | Freq: Once | INTRAVENOUS | Status: AC
  Administered 2019-01-10: 250 mL via INTRAVENOUS
  Filled 2019-01-10: qty 250

## 2019-01-10 MED ORDER — SODIUM CHLORIDE 0.9% FLUSH
10.0000 mL | INTRAVENOUS | Status: AC | PRN
  Administered 2019-01-10: 10 mL
  Filled 2019-01-10: qty 10

## 2019-01-10 NOTE — Telephone Encounter (Signed)
Added plts to 6/22 appointments. Confirmed with patient.

## 2019-01-10 NOTE — Patient Instructions (Signed)
Platelet Transfusion A platelet transfusion is a procedure in which you receive donated platelets through an IV. Platelets are tiny pieces of blood cells. When you get an injury, platelets clump together in the area to form a blood clot. This helps stop bleeding and is the beginning of the healing process. If you have too few platelets, your blood may have trouble clotting. This may cause you to bleed and bruise very easily. You may need a platelet transfusion if you have a condition that causes a low number of platelets (thrombocytopenia). A platelet transfusion may be used to stop or prevent excessive bleeding. Tell a health care provider about:  Any reactions you have had during previous transfusions.  Any allergies you have.  All medicines you are taking, including vitamins, herbs, eye drops, creams, and over-the-counter medicines.  Any blood disorders you have.  Any surgeries you have had.  Any medical conditions you have.  Whether you are pregnant or may be pregnant. What are the risks? Generally, this is a safe procedure. However, problems may occur, including:  Fever.  Infection.  Allergic reaction to the donor platelets.  Your body's disease-fighting system (immune system) attacking the donor platelets (hemolytic reaction). This is rare.  A rare reaction that causes lung damage (transfusion-related acute lung injury). What happens before the procedure? Medicines  Ask your health care provider about: ? Changing or stopping your regular medicines. This is especially important if you are taking diabetes medicines or blood thinners. ? Taking medicines such as aspirin and ibuprofen. These medicines can thin your blood. Do not take these medicines unless your health care provider tells you to take them. ? Taking over-the-counter medicines, vitamins, herbs, and supplements. General instructions  You will have a blood test to determine your blood type. Your blood type  determines what kind of platelets you will be given.  Follow instructions from your health care provider about eating or drinking restrictions.  If you have had an allergic reaction to a transfusion in the past, you may be given medicine to help prevent a reaction.  Your temperature, blood pressure, pulse, and breathing will be monitored. What happens during the procedure?   An IV will be inserted into one of your veins.  For your safety, two health care providers will verify your identity along with the donor platelets about to be infused.  A bag of donor platelets will be connected to your IV. The platelets will flow into your bloodstream. This usually takes 30-60 minutes.  Your temperature, blood pressure, pulse, and breathing will be monitored during the transfusion. This helps detect early signs of any reaction.  You will also be monitored for other symptoms that may indicate a reaction, including chills, hives, or itching.  If you have signs of a reaction at any time, your transfusion will be stopped, and you may be given medicine to help manage the reaction.  When your transfusion is complete, your IV will be removed.  Pressure may be applied to the IV site for a few minutes to stop any bleeding.  The IV site will be covered with a bandage (dressing). The procedure may vary among health care providers and hospitals. What happens after the procedure?  Your blood pressure, temperature, pulse, and breathing will be monitored until you leave the hospital or clinic.  You may have some bruising and soreness at your IV site. Follow these instructions at home: Medicines  Take over-the-counter and prescription medicines only as told by your health care provider.  Talk with your health care provider before you take any medicines that contain aspirin or NSAIDs. These medicines increase your risk for dangerous bleeding. General instructions  Change or remove your dressing as told  by your health care provider.  Return to your normal activities as told by your health care provider. Ask your health care provider what activities are safe for you.  Do not take baths, swim, or use a hot tub until your health care provider approves. Ask your health care provider if you may take showers.  Check your IV site every day for signs of infection. Check for: ? Redness, swelling, or pain. ? Fluid or blood. If fluid or blood drains from your IV site, use your hands to press down firmly on a bandage covering the area for a minute or two. Doing this should stop the bleeding. ? Warmth. ? Pus or a bad smell.  Keep all follow-up visits as told by your health care provider. This is important. Contact a health care provider if you have:  A headache that does not go away with medicine.  Hives, rash, or itchy skin.  Nausea or vomiting.  Unusual tiredness or weakness.  Signs of infection at your IV site. Get help right away if:  You have a fever or chills.  You urinate less often than usual.  Your urine is darker colored than normal.  You have any of the following: ? Trouble breathing. ? Pain in your back, abdomen, or chest. ? Cool, clammy skin. ? A fast heartbeat. Summary  Platelets are tiny pieces of blood cells that clump together to form a blood clot when you have an injury. If you have too few platelets, your blood may have trouble clotting.  A platelet transfusion is a procedure in which you receive donated platelets through an IV.  A platelet transfusion may be used to stop or prevent excessive bleeding.  After the procedure, check your IV site every day for signs of infection, including redness, swelling, pain, or warmth. This information is not intended to replace advice given to you by your health care provider. Make sure you discuss any questions you have with your health care provider. Document Released: 05/07/2007 Document Revised: 08/15/2017 Document  Reviewed: 08/15/2017 Elsevier Interactive Patient Education  2019 [place] (COVID-19) Are you at risk?  Are you at risk for the Coronavirus (COVID-19)?  To be considered HIGH RISK for Coronavirus (COVID-19), you have to meet the following criteria:  . Traveled to [place], [place], [place], [place] or [place]; or in the [place] to [place], [place], [place], or [place]; and have fever, cough, and shortness of breath within the last 2 weeks of travel OR . Been in close contact with a person diagnosed with COVID-19 within the last 2 weeks and have fever, cough, and shortness of breath . IF YOU DO NOT MEET THESE CRITERIA, YOU ARE CONSIDERED LOW RISK FOR COVID-19.  What to do if you are HIGH RISK for COVID-19?  [name] If you are having a medical emergency, call 911. . Seek medical care right away. Before you go to a doctor's office, urgent care or emergency department, call ahead and tell them about your recent travel, contact with someone diagnosed with COVID-19, and your symptoms. You should receive instructions from your physician's office regarding next steps of care.  . When you arrive at healthcare provider, tell the healthcare staff immediately you have returned from visiting [place], [place], [place], [place] or [place]; or traveled in the [place] to [place], [place], [place], or [place]; in the last two weeks or you have been in close contact with a person diagnosed with COVID-19 in the last 2 weeks.   . Tell the health care staff about your symptoms: fever, cough and shortness of breath. . After you have been seen by a medical provider, you will be either: o Tested for (COVID-19) and discharged home on quarantine except to seek medical care if symptoms worsen, and asked to  - Stay home and avoid contact with others until you get your results (4-5 days)  - Avoid travel on public transportation if possible (such as bus, train, or airplane) or o Sent to  the Emergency Department by EMS for evaluation, COVID-19 testing, and possible admission depending on your condition and test results.  What to do if you are LOW RISK for COVID-19?  Reduce your risk of any infection by using the same precautions used for avoiding the common cold or flu:  [name] Wash your hands often with soap and warm water for at least 20 seconds.  If soap and water are not readily available, use an alcohol-based hand sanitizer with at least 60% alcohol.  . If coughing or sneezing, cover your mouth and nose by coughing or sneezing into the elbow areas of your shirt or coat, into a tissue or into your sleeve (not your hands). . Avoid shaking hands with others and consider head nods or verbal greetings only. . Avoid touching your eyes, nose, or mouth with unwashed hands.  . Avoid close contact with people who are sick. . Avoid places or events with large numbers of people in one location, like concerts or sporting events. . Carefully consider travel plans you have or are making. . If you are planning any travel outside or inside the [place], visit the CDC's Travelers' Health webpage for the latest health notices. . If you have some symptoms but not all symptoms, continue to monitor at home and seek medical attention if your symptoms worsen. . If you are having a medical emergency, call 911.   [place] / e-Visit: [email or web address]         [facility]: [facility]: [phone]                   [facility]: [phone]

## 2019-01-10 NOTE — Progress Notes (Addendum)
Notified blood bank of need for platelets today. Will need to order them and should be here in 1 hour. Will administer random platelets today. Will crossmatch platelets for Monday. Orders placed. NP, Ned Card ordered her prednisone to begin tomorrow. Spoke with patient in infusion area and reviewed counts today and instructed her to start prednisone 60 mg daily with breakfast tomorrow. Provided printed and verbal education on medication and side effects/adverse effects to monitor for and when to call office. She reports she is completely off her lithium now. She will return Monday for lab/flush/injection and transfusion of crossmatched platelets.

## 2019-01-10 NOTE — Telephone Encounter (Signed)
Received call report from [facility].  "Today's Pltc = 7."  Secure Chat sent with results.

## 2019-01-11 LAB — BPAM PLATELET PHERESIS
Blood Product Expiration Date: 202006212359
ISSUE DATE / TIME: 202006191325
Unit Type and Rh: 5100

## 2019-01-11 LAB — PREPARE PLATELET PHERESIS: Unit division: 0

## 2019-01-13 ENCOUNTER — Inpatient Hospital Stay: Payer: BC Managed Care – PPO

## 2019-01-13 ENCOUNTER — Other Ambulatory Visit: Payer: Self-pay

## 2019-01-13 DIAGNOSIS — Z79899 Other long term (current) drug therapy: Secondary | ICD-10-CM | POA: Diagnosis not present

## 2019-01-13 DIAGNOSIS — R0602 Shortness of breath: Secondary | ICD-10-CM | POA: Diagnosis not present

## 2019-01-13 DIAGNOSIS — D801 Nonfamilial hypogammaglobulinemia: Secondary | ICD-10-CM | POA: Diagnosis not present

## 2019-01-13 DIAGNOSIS — J189 Pneumonia, unspecified organism: Secondary | ICD-10-CM | POA: Diagnosis not present

## 2019-01-13 DIAGNOSIS — N6331 Unspecified lump in axillary tail of the right breast: Secondary | ICD-10-CM | POA: Diagnosis not present

## 2019-01-13 DIAGNOSIS — F329 Major depressive disorder, single episode, unspecified: Secondary | ICD-10-CM | POA: Diagnosis not present

## 2019-01-13 DIAGNOSIS — C9110 Chronic lymphocytic leukemia of B-cell type not having achieved remission: Secondary | ICD-10-CM | POA: Diagnosis not present

## 2019-01-13 DIAGNOSIS — G4700 Insomnia, unspecified: Secondary | ICD-10-CM | POA: Diagnosis not present

## 2019-01-13 DIAGNOSIS — D696 Thrombocytopenia, unspecified: Secondary | ICD-10-CM

## 2019-01-13 DIAGNOSIS — M542 Cervicalgia: Secondary | ICD-10-CM | POA: Diagnosis not present

## 2019-01-13 DIAGNOSIS — I341 Nonrheumatic mitral (valve) prolapse: Secondary | ICD-10-CM | POA: Diagnosis not present

## 2019-01-13 DIAGNOSIS — R161 Splenomegaly, not elsewhere classified: Secondary | ICD-10-CM | POA: Diagnosis not present

## 2019-01-13 DIAGNOSIS — E039 Hypothyroidism, unspecified: Secondary | ICD-10-CM | POA: Diagnosis not present

## 2019-01-13 DIAGNOSIS — D61818 Other pancytopenia: Secondary | ICD-10-CM | POA: Diagnosis not present

## 2019-01-13 LAB — CBC WITH DIFFERENTIAL (CANCER CENTER ONLY)
Abs Immature Granulocytes: 0 10*3/uL (ref 0.00–0.07)
Basophils Absolute: 0 10*3/uL (ref 0.0–0.1)
Basophils Relative: 0 %
Eosinophils Absolute: 0 10*3/uL (ref 0.0–0.5)
Eosinophils Relative: 0 %
HCT: 29 % — ABNORMAL LOW (ref 36.0–46.0)
Hemoglobin: 9.6 g/dL — ABNORMAL LOW (ref 12.0–15.0)
Immature Granulocytes: 0 %
Lymphocytes Relative: 90 %
Lymphs Abs: 5.1 10*3/uL — ABNORMAL HIGH (ref 0.7–4.0)
MCH: 28.6 pg (ref 26.0–34.0)
MCHC: 33.1 g/dL (ref 30.0–36.0)
MCV: 86.3 fL (ref 80.0–100.0)
Monocytes Absolute: 0.1 10*3/uL (ref 0.1–1.0)
Monocytes Relative: 1 %
Neutro Abs: 0.5 10*3/uL — ABNORMAL LOW (ref 1.7–7.7)
Neutrophils Relative %: 9 %
Platelet Count: 17 10*3/uL — ABNORMAL LOW (ref 150–400)
RBC: 3.36 MIL/uL — ABNORMAL LOW (ref 3.87–5.11)
RDW: 11.7 % (ref 11.5–15.5)
WBC Count: 5.8 10*3/uL (ref 4.0–10.5)
nRBC: 0 % (ref 0.0–0.2)

## 2019-01-13 LAB — RETIC PANEL
Immature Retic Fract: 3.8 % (ref 2.3–15.9)
RBC.: 3.36 MIL/uL — ABNORMAL LOW (ref 3.87–5.11)
Retic Count, Absolute: 4 10*3/uL — ABNORMAL LOW (ref 19.0–186.0)
Retic Ct Pct: 0.1 % — ABNORMAL LOW (ref 0.4–3.1)
Reticulocyte Hemoglobin: 34.3 pg (ref >27.9)

## 2019-01-13 LAB — CMP (CANCER CENTER ONLY)
ALT: 19 U/L (ref 0–44)
AST: 16 U/L (ref 15–41)
Albumin: 4.2 g/dL (ref 3.5–5.0)
Alkaline Phosphatase: 82 U/L (ref 38–126)
Anion gap: 9 (ref 5–15)
BUN: 21 mg/dL — ABNORMAL HIGH (ref 6–20)
CO2: 25 mmol/L (ref 22–32)
Calcium: 9.7 mg/dL (ref 8.9–10.3)
Chloride: 109 mmol/L (ref 98–111)
Creatinine: 0.79 mg/dL (ref 0.44–1.00)
GFR, Est AFR Am: >60 mL/min (ref >60)
GFR, Estimated: >60 mL/min (ref >60)
Glucose, Bld: 117 mg/dL — ABNORMAL HIGH (ref 70–99)
Potassium: 4 mmol/L (ref 3.5–5.1)
Sodium: 143 mmol/L (ref 135–145)
Total Bilirubin: 0.6 mg/dL (ref 0.3–1.2)
Total Protein: 6.2 g/dL — ABNORMAL LOW (ref 6.5–8.1)

## 2019-01-13 LAB — SAMPLE TO BLOOD BANK

## 2019-01-13 MED ORDER — SODIUM CHLORIDE 0.9% FLUSH
10.0000 mL | INTRAVENOUS | Status: DC | PRN
  Administered 2019-01-13: 10 mL
  Filled 2019-01-13: qty 10

## 2019-01-13 NOTE — Progress Notes (Signed)
No Treatment today. Per Dr. Gearldine Shown nurse: "She may leave. No transfusions necessary. Stay on all current meds for now. Dr. Benay Spice will contact Dr. Zara Council today and if there are any changes we will call her." Patient informed and she is in agreement. Blood bank informed of no crossmatched platelets transfusion today.

## 2019-01-14 ENCOUNTER — Other Ambulatory Visit: Payer: Self-pay

## 2019-01-14 ENCOUNTER — Other Ambulatory Visit: Payer: Self-pay | Admitting: *Deleted

## 2019-01-14 DIAGNOSIS — D696 Thrombocytopenia, unspecified: Secondary | ICD-10-CM

## 2019-01-14 DIAGNOSIS — C9110 Chronic lymphocytic leukemia of B-cell type not having achieved remission: Secondary | ICD-10-CM

## 2019-01-14 MED ORDER — ALPRAZOLAM 0.25 MG PO TABS: ORAL_TABLET | ORAL | 3 refills | Status: DC

## 2019-01-15 ENCOUNTER — Other Ambulatory Visit: Payer: Self-pay

## 2019-01-15 ENCOUNTER — Inpatient Hospital Stay: Payer: BC Managed Care – PPO

## 2019-01-15 ENCOUNTER — Telehealth: Payer: Self-pay | Admitting: Psychiatry

## 2019-01-15 ENCOUNTER — Telehealth: Payer: Self-pay

## 2019-01-15 ENCOUNTER — Telehealth: Payer: Self-pay | Admitting: Pharmacist

## 2019-01-15 ENCOUNTER — Encounter: Payer: Self-pay | Admitting: Nurse Practitioner

## 2019-01-15 ENCOUNTER — Inpatient Hospital Stay (HOSPITAL_BASED_OUTPATIENT_CLINIC_OR_DEPARTMENT_OTHER): Payer: BC Managed Care – PPO | Admitting: Nurse Practitioner

## 2019-01-15 VITALS — BP 106/62 | HR 77 | Temp 98.7°F | Resp 18 | Ht 66.0 in | Wt 132.8 lb

## 2019-01-15 DIAGNOSIS — N6331 Unspecified lump in axillary tail of the right breast: Secondary | ICD-10-CM | POA: Diagnosis not present

## 2019-01-15 DIAGNOSIS — D61818 Other pancytopenia: Secondary | ICD-10-CM

## 2019-01-15 DIAGNOSIS — F329 Major depressive disorder, single episode, unspecified: Secondary | ICD-10-CM

## 2019-01-15 DIAGNOSIS — Z452 Encounter for adjustment and management of vascular access device: Secondary | ICD-10-CM

## 2019-01-15 DIAGNOSIS — M542 Cervicalgia: Secondary | ICD-10-CM

## 2019-01-15 DIAGNOSIS — C9110 Chronic lymphocytic leukemia of B-cell type not having achieved remission: Secondary | ICD-10-CM | POA: Diagnosis not present

## 2019-01-15 DIAGNOSIS — D696 Thrombocytopenia, unspecified: Secondary | ICD-10-CM

## 2019-01-15 DIAGNOSIS — Z79899 Other long term (current) drug therapy: Secondary | ICD-10-CM | POA: Diagnosis not present

## 2019-01-15 DIAGNOSIS — R161 Splenomegaly, not elsewhere classified: Secondary | ICD-10-CM

## 2019-01-15 DIAGNOSIS — D801 Nonfamilial hypogammaglobulinemia: Secondary | ICD-10-CM

## 2019-01-15 DIAGNOSIS — R0602 Shortness of breath: Secondary | ICD-10-CM

## 2019-01-15 DIAGNOSIS — I341 Nonrheumatic mitral (valve) prolapse: Secondary | ICD-10-CM | POA: Diagnosis not present

## 2019-01-15 DIAGNOSIS — D649 Anemia, unspecified: Secondary | ICD-10-CM

## 2019-01-15 DIAGNOSIS — G4700 Insomnia, unspecified: Secondary | ICD-10-CM | POA: Diagnosis not present

## 2019-01-15 DIAGNOSIS — J189 Pneumonia, unspecified organism: Secondary | ICD-10-CM | POA: Diagnosis not present

## 2019-01-15 DIAGNOSIS — E039 Hypothyroidism, unspecified: Secondary | ICD-10-CM

## 2019-01-15 LAB — CBC WITH DIFFERENTIAL (CANCER CENTER ONLY)
Abs Immature Granulocytes: 0.05 10*3/uL (ref 0.00–0.07)
Basophils Absolute: 0 10*3/uL (ref 0.0–0.1)
Basophils Relative: 0 %
Eosinophils Absolute: 0 10*3/uL (ref 0.0–0.5)
Eosinophils Relative: 0 %
HCT: 28.5 % — ABNORMAL LOW (ref 36.0–46.0)
Hemoglobin: 9.7 g/dL — ABNORMAL LOW (ref 12.0–15.0)
Immature Granulocytes: 1 %
Lymphocytes Relative: 91 %
Lymphs Abs: 6.6 10*3/uL — ABNORMAL HIGH (ref 0.7–4.0)
MCH: 29.2 pg (ref 26.0–34.0)
MCHC: 34 g/dL (ref 30.0–36.0)
MCV: 85.8 fL (ref 80.0–100.0)
Monocytes Absolute: 0.1 10*3/uL (ref 0.1–1.0)
Monocytes Relative: 1 %
Neutro Abs: 0.5 10*3/uL — ABNORMAL LOW (ref 1.7–7.7)
Neutrophils Relative %: 7 %
Platelet Count: 14 10*3/uL — ABNORMAL LOW (ref 150–400)
RBC: 3.32 MIL/uL — ABNORMAL LOW (ref 3.87–5.11)
RDW: 11.8 % (ref 11.5–15.5)
WBC Count: 7.2 10*3/uL (ref 4.0–10.5)
nRBC: 0 % (ref 0.0–0.2)

## 2019-01-15 LAB — SAMPLE TO BLOOD BANK

## 2019-01-15 MED ORDER — ELTROMBOPAG OLAMINE 50 MG PO TABS: 50.0000 mg | ORAL_TABLET | Freq: Every day | ORAL | 0 refills | Status: DC

## 2019-01-15 MED ORDER — ROMIPLOSTIM INJECTION 500 MCG
5.0000 ug/kg | Freq: Once | SUBCUTANEOUS | Status: AC
  Administered 2019-01-15: 300 ug via SUBCUTANEOUS
  Filled 2019-01-15: qty 0.6

## 2019-01-15 MED ORDER — HEPARIN SOD (PORK) LOCK FLUSH 100 UNIT/ML IV SOLN
500.0000 [IU] | Freq: Once | INTRAVENOUS | Status: AC
  Administered 2019-01-15: 250 [IU] via INTRAVENOUS
  Filled 2019-01-15: qty 5

## 2019-01-15 MED ORDER — SODIUM CHLORIDE 0.9% FLUSH
10.0000 mL | INTRAVENOUS | Status: DC | PRN
  Administered 2019-01-15: 10 mL via INTRAVENOUS
  Filled 2019-01-15: qty 10

## 2019-01-15 NOTE — Telephone Encounter (Signed)
Not clear what this message is asking, a refill was submitted yesterday for her xanax if that's what she is requesting.

## 2019-01-15 NOTE — Telephone Encounter (Signed)
Oral Oncology Pharmacist Encounter  Received new prescription for Promacta (eltrombopag) for the treatment of refractory, severe, aplastic anemia, planned duration until adequate disease control or unacceptable toxicity.  Patients counts have not responded to treatment with prednisone, rituximab, or other colony stimulating factors Patient has been red blood cell and platelet transfusion dependent for ~ 6 months  Promacta is planned to be administered at 50mg  by mouth once daily and up-titrated to adequate response Patient will receive a dose of romboplastin today and then will transition to eltrombopag when it is available  Labs from Epic assessed, [place] for treatment.  Current medication list in Epic reviewed, no DDIs with Promacta identified.  Prescription has been e-scribed to the [facility] for benefits analysis and approval.  Oral Oncology Clinic will continue to follow for insurance authorization, copayment issues, initial counseling and start date.  Johny Drilling, PharmD, BCPS, BCOP  01/15/2019 11:22 AM Oral Oncology Clinic [phone]

## 2019-01-15 NOTE — Progress Notes (Addendum)
[facility]   Telephone:(336) [phone] Fax:(336) [phone]   Clinic Follow up Note   Patient Care Team: Lavone Orn, MD as PCP - General (Internal Medicine) 01/15/2019  CHIEF COMPLAINT: CLL  INTERVAL HISTORY: Meagan Walsh returns for follow up as scheduled. She was last seen on 01/08/19. She received platelet transfusion on 6/19 for plt 7K. Missed Nplate injection on 1/30. She began prednisone 60 mg daily on 6/20. She takes it in the morning. Began to feel jittery and tearful at times. She has difficulty sleeping. Requires xanax at bedtime then ambien few hours later to finally get to sleep. She is eating and drinking well. Denies bleeding or bruising. Bowels moving normally. Denies fever, chills, cough, chest pain, dyspnea. PICC working well.     MEDICAL HISTORY:  Past Medical History:  Diagnosis Date  . Anxiety   . Cancer ([place])    CLL  . Degenerative disk disease    Neck--chronic pain  . Depression   . Fibroids    Uterine  . 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 Hypothyroidism   . Lymphocytosis   . Mitral valve prolapse   . Mitral valve prolapse   . Thyroid disease    Hypothyroid    SURGICAL HISTORY: Past Surgical History:  Procedure Laterality Date  . BREAST BIOPSY Left 2000  . CESAREAN SECTION  1996  . MELANOMA EXCISION WITH SENTINEL LYMPH NODE BIOPSY Left 10/01/2017   Procedure: EXCISIONAL BIOPSY OF LEFT INGUINAL LYMPH NODE;  Surgeon: Greer Pickerel, MD;  Location: [facility];  Service: General;  Laterality: Left;  . REVISION OF ABDOMINAL SCAR    . TONSILLECTOMY     age 58    I have reviewed the social history and family history with the patient and they are unchanged from previous note.  ALLERGIES:  has No Known Allergies.  MEDICATIONS:  Current Outpatient Medications  Medication Sig Dispense Refill  . acetaminophen (TYLENOL) 325 MG tablet Take 650 mg by mouth every 6 (six) hours as needed.    . ALPRAZolam (XANAX) 0.25  MG tablet TAKE 1 TO 2 TABLETS BY MOUTH EVERY DAY AS NEEDED FOR ANXIETY 30 tablet 3  . aminocaproic acid (AMICAR) 500 MG tablet Take 2 tablets (1,000 mg total) by mouth every 8 (eight) hours. 180 tablet 1  . fluconazole (DIFLUCAN) 100 MG tablet Take 1 tablet (100 mg total) by mouth daily. 30 tablet 0  . folic acid (FOLVITE) 1 MG tablet Take 1 tablet (1 mg total) by mouth daily. 30 tablet 2  . Ibuprofen 200 MG CAPS Take 400 mg by mouth as needed.    [name] levofloxacin (LEVAQUIN) 500 MG tablet Take 1 tablet (500 mg total) by mouth daily. 30 tablet 0  . levonorgestrel (MIRENA) 20 MCG/24HR IUD 1 each by Intrauterine route once.     [name] levothyroxine (SYNTHROID, LEVOTHROID) 100 MCG tablet Take 100 mcg by mouth daily.    . predniSONE (DELTASONE) 20 MG tablet Take 3 tablets (60 mg total) by mouth daily with breakfast. 40 tablet 0  . traMADol (ULTRAM) 50 MG tablet TAKE 1 TABLET BY MOUTH EVERY 8 HOURS AS NEEDED 20 tablet 0  . valACYclovir (VALTREX) 500 MG tablet Take 1 tablet (500 mg total) by mouth 2 (two) times daily. 60 tablet 0  . vortioxetine HBr (TRINTELLIX) 20 MG TABS tablet Take 1 tablet (20 mg total) by mouth daily. 90 tablet 0  . zolpidem (AMBIEN) 10 MG tablet TAKE 1/2 TO  1 TABLET BY MOUTH AS NEEDED FOR INSOMNIA. 30 tablet 5  . eltrombopag (PROMACTA) 50 MG tablet Take 1 tablet (50 mg total) by mouth daily. Take on an empty stomach 1 hour before a meal or 2 hours after 30 tablet 0   No current facility-administered medications for this visit.     PHYSICAL EXAMINATION: ECOG PERFORMANCE STATUS: 1 - Symptomatic but completely ambulatory  Vitals:   01/15/19 0959  BP: 106/62  Pulse: 77  Resp: 18  Temp: 98.7 F (37.1 C)  SpO2: 100%   Filed Weights   01/15/19 0959  Weight: 132 lb 12.8 oz (60.2 kg)    GENERAL:alert, no distress and comfortable SKIN: no rash  EYES: sclera clear OROPHARYNX:no thrush. 1 tiny ecchymosis to right buccal surface   LYMPH:  Previous right axillary node is not  palpable  LUNGS: respirations even and unlabored  HEART:  no lower extremity edema ABDOMEN:abdomen flat  Musculoskeletal:no cyanosis of digits  NEURO: alert & oriented x 3 with fluent speech PICC without erythema  Limited exam for covid19 outbreak   LABORATORY DATA:  I have reviewed the data as listed CBC Latest Ref Rng & Units 01/15/2019 01/13/2019 01/10/2019  WBC 4.0 - 10.5 K/uL 7.2 5.8 3.2(L)  Hemoglobin 12.0 - 15.0 g/dL 9.7(L) 9.6(L) 10.4(L)  Hematocrit 36.0 - 46.0 % 28.5(L) 29.0(L) 31.1(L)  Platelets 150 - 400 K/uL 14(L) 17(L) 7(LL)     CMP Latest Ref Rng & Units 01/13/2019 12/27/2018 12/20/2018  Glucose 70 - 99 mg/dL 117(H) 102(H) 115(H)  BUN 6 - 20 mg/dL 21(H) 20 22(H)  Creatinine 0.44 - 1.00 mg/dL 0.79 0.78 0.81  Sodium 135 - 145 mmol/L 143 143 141  Potassium 3.5 - 5.1 mmol/L 4.0 4.4 4.1  Chloride 98 - 111 mmol/L 109 109 108  CO2 22 - 32 mmol/L '25 27 27  ' Calcium 8.9 - 10.3 mg/dL 9.7 9.6 9.6  Total Protein 6.5 - 8.1 g/dL 6.2(L) 6.3(L) 6.3(L)  Total Bilirubin 0.3 - 1.2 mg/dL 0.6 0.6 0.6  Alkaline Phos 38 - 126 U/L 82 103 111  AST 15 - 41 U/L 16 13(L) 12(L)  ALT 0 - 44 U/L '19 18 14      ' RADIOGRAPHIC STUDIES: I have personally reviewed the radiological images as listed and agreed with the findings in the report. No results found.   ASSESSMENT & PLAN:  1.CLL-diagnosed in August 2010, flow cytometry consistent with CLL Enlarged leftinguinal lymph node January 2019,smallneck/axillary nodes and palpable splenomegaly 09/11/2017  CTson 09/17/2017-3 cm necrotic appearing lymph node in the left inguinal region, borderline enlarged pelvic/retroperitoneal, chest, and axillary nodes. Mild splenomegaly.  Ultrasound-guided biopsy of the left inguinal lymph node 09/18/2017, slightly "purulent "fluid aspirated, core biopsy is consistent with an atypical lymphoid proliferation-extensive necrosis with surrounding epithelioid histiocytes, limited intact lymphoid tissue involved with CLL   Incisional biopsy of a necrotic/purulent left inguinal lymph node on 10/01/2017-extensive necrosis with granulomatous inflammation, small amount of viable lymphoid tissue involved with CLL, AFB and fungal stains negative  Peripheral blood FISH analysis 02/05/2018-deletion 13q14, no evidence of p53 (17p13) deletion, no evidence of 11q22deletion  Bone marrow biopsy 02/26/2018-hypercellular marrow with extensive involvement by CLL, lymphocytes represent85% of all cells  Ibrutinib initiated 04/03/2018  Ibrutinib placed on hold 04/11/2018 due to onset of arthralgias  Ibrutinib resumed 04/16/2018, discontinued 04/25/2018 secondary to severe arthralgias/arthritis  Ibrutinib resumed at a dose of 140 mg daily 05/03/2018  Ibrutinib dose adjusted to 140 mg alternating with 240m 06/25/2018  Ibrutinib discontinued 07/03/2018 secondary to  severe arthralgias  Acalabrutinib 08/16/2018, discontinued 11/15/2018 secondary to persistent severe transfusion dependent anemia and neutropenia/thrombocytopenia, last dose 11/14/2018  Bone marrow biopsy 11/21/2018-decreased cellularity, involvement by CLL, decreased erythroid and granulocytic precursors, decreased megakaryocytes  Cycle 1 rituximab 12/06/2018  Bone marrow biopsy 12/24/2018 at [facility]-hypocellular bone marrow (10%) involved by CLL, representing 50% of marrow cellularity; markedly decreased trilineage hematopoiesis including essentially absent erythropoiesis  2.Hypothyroidism 3.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 Augmentin prescribed. 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 Pancytopenia secondary to CLL and a hypocellular bone marrow  G-CSF and Nplate started 0/96/2836, G-CSF changed to daily beginning 12/17/2018; G-CSF discontinued 12/31/2018   Began prednisone 60 mg daily 01/11/19, tolerating moderately well except jitteriness and difficulty sleeping  Dispo:  Ms. Zeimet is doing well. She is tolerating prednisone 60 mg moderately well except jitteriness and difficulty sleeping. She will continue current dose. CBC with stable Hgb and ANC. Platelets improved with recent transfusion and holding today at 14K. No evidence of bleeding. She does not need transfusion support today. She will receive Nplate today, then will switch to promacta. Pharmacy is aware. If George remains stable at 500 or above on 6/26, will discontinue levaquin and continue valtrex and diflucan. Continue folic acid. She will return for lab and PICC flush MWF, and f/u in 1 and 2 weeks. She will skip lab/flush on 7/6 due to being out of town. The patient was seen with Dr. Benay Spice who discussed this case with Dr. Prince Solian during today's visit. Patient's husband attended the visit via phone call.   All questions were answered. The patient knows to call the clinic with any problems, questions or concerns. No barriers to learning was detected.     Alla Feeling, NP 01/15/19   This was a shared visit with Cira Rue.  There has been mild improvement in the pancytopenia over the past 2 weeks.  She was last transfused with packed red blood cells 13 days ago and the hemoglobin is holding.  She last received platelets on 01/10/2019 and the platelet count has not dropped significantly over the past 2 days.  The neutrophil count is holding at 500.  I discussed the current status of the bone marrow failure with [name].  Her husband was present by telephone.  I discussed the case with Dr. Prince Solian today.  The plan is to continue antibiotic prophylaxis.  We will switch from Nplate to  Promacta since the platelet count has not spontaneously improved while on Nplate.  She will continue a 2-week trial of prednisone.  We will taper the prednisone dose if the anxiety while on prednisone becomes more bothersome.  [name], MD

## 2019-01-15 NOTE — Patient Instructions (Signed)
Romiplostim injection What is this medicine? ROMIPLOSTIM (roe mi PLOE stim) helps your body make more platelets. This medicine is used to treat low platelets caused by chronic idiopathic thrombocytopenic purpura (ITP). This medicine may be used for other purposes; ask your health care provider or pharmacist if you have questions. COMMON BRAND NAME(S): Nplate What should I tell my health care provider before I take this medicine? They need to know if you have any of these conditions: -bleeding disorders -bone marrow problem, like blood cancer or myelodysplastic syndrome -history of blood clots -liver disease -surgery to remove your spleen -an unusual or allergic reaction to romiplostim, mannitol, other medicines, foods, dyes, or preservatives -pregnant or trying to get pregnant -breast-feeding How should I use this medicine? This medicine is for injection under the skin. It is given by a health care professional in a hospital or clinic setting. A special MedGuide will be given to you before your injection. Read this information carefully each time. Talk to your pediatrician regarding the use of this medicine in children. While this drug may be prescribed for children as young as 1 year for selected conditions, precautions do apply. Overdosage: If you think you have taken too much of this medicine contact a poison control center or emergency room at once. NOTE: This medicine is only for you. Do not share this medicine with others. What if I miss a dose? It is important not to miss your dose. Call your doctor or health care professional if you are unable to keep an appointment. What may interact with this medicine? Interactions are not expected. This list may not describe all possible interactions. Give your health care provider a list of all the medicines, herbs, non-prescription drugs, or dietary supplements you use. Also tell them if you smoke, drink alcohol, or use illegal drugs. Some items  may interact with your medicine. What should I watch for while using this medicine? Your condition will be monitored carefully while you are receiving this medicine. Visit your prescriber or health care professional for regular checks on your progress and for the needed blood tests. It is important to keep all appointments. What side effects may I notice from receiving this medicine? Side effects that you should report to your doctor or health care professional as soon as possible: -allergic reactions like skin rash, itching or hives, swelling of the face, lips, or tongue -signs and symptoms of bleeding such as bloody or black, tarry stools; red or dark brown urine; spitting up blood or brown material that looks like coffee grounds; red spots on the skin; unusual bruising or bleeding from the eyes, gums, or nose -signs and symptoms of a blood clot such as chest pain; shortness of breath; pain, swelling, or warmth in the leg -signs and symptoms of a stroke like changes in vision; confusion; trouble speaking or understanding; severe headaches; sudden numbness or weakness of the face, arm or leg; trouble walking; dizziness; loss of balance or coordination Side effects that usually do not require medical attention (report to your doctor or health care professional if they continue or are bothersome): -headache -pain in arms and legs -pain in mouth -stomach pain This list may not describe all possible side effects. Call your doctor for medical advice about side effects. You may report side effects to FDA at 1-800-FDA-1088. Where should I keep my medicine? This drug is given in a hospital or clinic and will not be stored at home. NOTE: This sheet is a summary. It may not   cover all possible information. If you have questions about this medicine, talk to your doctor, pharmacist, or health care provider.  2019 Elsevier/Gold Standard (2017-07-09 11:10:55)  

## 2019-01-15 NOTE — Telephone Encounter (Signed)
Oral Oncology Patient Advocate Encounter  Prior Authorization for Antony Blackbird has been approved.    PA# [id number] Effective dates: 01/15/19 through 05/07/19  Oral Oncology Clinic will continue to follow.   [place] Patient [place] Phone [phone] Fax [phone] 01/15/2019    2:38 PM

## 2019-01-15 NOTE — Telephone Encounter (Signed)
Patient left vm today @12 :17 stated we need to be on a look out for Xanax prior authorization her oncologist has her on prednazone but she has no xanax

## 2019-01-15 NOTE — Telephone Encounter (Signed)
Oral Oncology Patient Advocate Encounter  Received notification from [facility] that prior authorization for Promacta is required.  PA submitted on CoverMyMeds Key [id number] Status is pending  Oral Oncology Clinic will continue to follow.  [place] Patient [place] Phone [phone] Fax [phone] 01/15/2019    11:53 AM

## 2019-01-16 ENCOUNTER — Telehealth: Payer: Self-pay

## 2019-01-16 ENCOUNTER — Telehealth: Payer: Self-pay | Admitting: Nurse Practitioner

## 2019-01-16 ENCOUNTER — Other Ambulatory Visit: Payer: Self-pay | Admitting: Oncology

## 2019-01-16 NOTE — Telephone Encounter (Signed)
Oral Oncology Patient Advocate Encounter  Promacta copay is $100.  I was successful in obtaining a copay card and this will make the patients out of pocket cost $0.  This copay card information is as follows and has been shared with [place].  BIN: [id number] PCN: [id number] Grp: [id number] ID: [id number]  I spoke to the patient and gave her this information, she verbalized great appreciation.  [place] Patient [place] Phone [phone] Fax [phone] 01/16/2019   9:51 AM

## 2019-01-16 NOTE — Telephone Encounter (Signed)
Scheduled appt per 6/24 los. Spoke with patient and patient aware of appt date and time.

## 2019-01-16 NOTE — Telephone Encounter (Signed)
Oral Chemotherapy Pharmacist Encounter   I spoke with patient for overview of: Promacta (eltrombopag) for the treatment of refractory, severe, aplastic anemia, planned duration until adequate disease control or unacceptable toxicity.  Counseled patient on administration, dosing, side effects, monitoring, drug-food interactions, safe handling, storage, and disposal.  Patient informed that Promacta dose may be increased or decreased in the future based on benefit and toleration.  Patient will take Promacta 50mg  tablets, 1 tablet by mouth once daily on an empty stomach, 1 hour before or 2 hours after a meal.  Patient plans to take her Promacta before bed each evening, which will be 2 hours after dinner, and then will not affect her morning coffee with cream.  Patient counseled to administer Promacta at least 2 hours before, or 4 hours after antacids, foods high in calcium, or vitamin supplements (iron, calcium, aluminum, magnesium, selinium, zinc).  Promacta start date: 01/20/2019  Adverse effects include but are not limited to: fatigue, diarrhea, nausea, pruritis, decreased blood counts, hepatotoxicity, flu-like symptoms, myalgias, fever, and peripheral edema.    Reviewed with patient importance of keeping a medication schedule and plan for any missed doses.  Medication reconciliation performed and medication/allergy list updated.  Patient instructed to continue antimicrobials until Dr. Benay Spice says otherwise. She also continues on prednisone daily and will alert the office for any further changes in mood due to steroids.  Insurance authorization for Antony Blackbird has been obtained. Test claim at the pharmacy revealed copayment $100 for 1st fill of Promacta. Oral oncology patient advocate was successful in obtaining manufacturer copayment coupon to cover out of pocket expenses at the pharmacy. Promacta copayment will now be $0 per fill.  Patient will pick this up from the [facility] on 01/17/2019 after her lab appointment.  Patient informed the pharmacy will reach out 5-7 days prior to needing next fill of Promacta to coordinate continued medication acquisition to prevent break in therapy.  All questions answered.  Ms. Nicholls voiced understanding and appreciation.   Patient knows to call the office with questions or concerns.  Johny Drilling, PharmD, BCPS, BCOP  01/16/2019   9:42 AM Oral Oncology Clinic [phone]

## 2019-01-16 NOTE — Telephone Encounter (Signed)
Thanks

## 2019-01-17 ENCOUNTER — Inpatient Hospital Stay: Payer: BC Managed Care – PPO

## 2019-01-17 ENCOUNTER — Other Ambulatory Visit: Payer: Self-pay | Admitting: *Deleted

## 2019-01-17 ENCOUNTER — Other Ambulatory Visit: Payer: Self-pay

## 2019-01-17 DIAGNOSIS — R0602 Shortness of breath: Secondary | ICD-10-CM | POA: Diagnosis not present

## 2019-01-17 DIAGNOSIS — D696 Thrombocytopenia, unspecified: Secondary | ICD-10-CM

## 2019-01-17 DIAGNOSIS — D649 Anemia, unspecified: Secondary | ICD-10-CM

## 2019-01-17 DIAGNOSIS — G4700 Insomnia, unspecified: Secondary | ICD-10-CM | POA: Diagnosis not present

## 2019-01-17 DIAGNOSIS — C9110 Chronic lymphocytic leukemia of B-cell type not having achieved remission: Secondary | ICD-10-CM

## 2019-01-17 DIAGNOSIS — Z79899 Other long term (current) drug therapy: Secondary | ICD-10-CM | POA: Diagnosis not present

## 2019-01-17 DIAGNOSIS — N6331 Unspecified lump in axillary tail of the right breast: Secondary | ICD-10-CM | POA: Diagnosis not present

## 2019-01-17 DIAGNOSIS — J189 Pneumonia, unspecified organism: Secondary | ICD-10-CM | POA: Diagnosis not present

## 2019-01-17 DIAGNOSIS — D801 Nonfamilial hypogammaglobulinemia: Secondary | ICD-10-CM | POA: Diagnosis not present

## 2019-01-17 DIAGNOSIS — D61818 Other pancytopenia: Secondary | ICD-10-CM | POA: Diagnosis not present

## 2019-01-17 DIAGNOSIS — E039 Hypothyroidism, unspecified: Secondary | ICD-10-CM | POA: Diagnosis not present

## 2019-01-17 DIAGNOSIS — F329 Major depressive disorder, single episode, unspecified: Secondary | ICD-10-CM | POA: Diagnosis not present

## 2019-01-17 DIAGNOSIS — M542 Cervicalgia: Secondary | ICD-10-CM | POA: Diagnosis not present

## 2019-01-17 DIAGNOSIS — I341 Nonrheumatic mitral (valve) prolapse: Secondary | ICD-10-CM | POA: Diagnosis not present

## 2019-01-17 DIAGNOSIS — R161 Splenomegaly, not elsewhere classified: Secondary | ICD-10-CM | POA: Diagnosis not present

## 2019-01-17 LAB — CBC WITH DIFFERENTIAL (CANCER CENTER ONLY)
Abs Immature Granulocytes: 0.01 10*3/uL (ref 0.00–0.07)
Basophils Absolute: 0 10*3/uL (ref 0.0–0.1)
Basophils Relative: 0 %
Eosinophils Absolute: 0 10*3/uL (ref 0.0–0.5)
Eosinophils Relative: 0 %
HCT: 26.9 % — ABNORMAL LOW (ref 36.0–46.0)
Hemoglobin: 9.2 g/dL — ABNORMAL LOW (ref 12.0–15.0)
Immature Granulocytes: 0 %
Lymphocytes Relative: 91 %
Lymphs Abs: 7.7 10*3/uL — ABNORMAL HIGH (ref 0.7–4.0)
MCH: 29.1 pg (ref 26.0–34.0)
MCHC: 34.2 g/dL (ref 30.0–36.0)
MCV: 85.1 fL (ref 80.0–100.0)
Monocytes Absolute: 0.1 10*3/uL (ref 0.1–1.0)
Monocytes Relative: 1 %
Neutro Abs: 0.7 10*3/uL — ABNORMAL LOW (ref 1.7–7.7)
Neutrophils Relative %: 8 %
Platelet Count: 11 10*3/uL — ABNORMAL LOW (ref 150–400)
RBC: 3.16 MIL/uL — ABNORMAL LOW (ref 3.87–5.11)
RDW: 11.8 % (ref 11.5–15.5)
WBC Count: 8.5 10*3/uL (ref 4.0–10.5)
nRBC: 0 % (ref 0.0–0.2)

## 2019-01-17 LAB — SAMPLE TO BLOOD BANK

## 2019-01-17 MED ORDER — HEPARIN SOD (PORK) LOCK FLUSH 100 UNIT/ML IV SOLN
500.0000 [IU] | Freq: Once | INTRAVENOUS | Status: AC | PRN
  Administered 2019-01-17: 500 [IU]
  Filled 2019-01-17: qty 5

## 2019-01-17 MED ORDER — SODIUM CHLORIDE 0.9% FLUSH
3.0000 mL | INTRAVENOUS | Status: AC | PRN
  Administered 2019-01-17: 3 mL
  Filled 2019-01-17: qty 10

## 2019-01-17 MED ORDER — SODIUM CHLORIDE 0.9% IV SOLUTION
250.0000 mL | Freq: Once | INTRAVENOUS | Status: AC
  Administered 2019-01-17: 250 mL via INTRAVENOUS
  Filled 2019-01-17: qty 250

## 2019-01-17 MED ORDER — SODIUM CHLORIDE 0.9% FLUSH
10.0000 mL | INTRAVENOUS | Status: DC | PRN
  Administered 2019-01-17: 10 mL
  Filled 2019-01-17: qty 10

## 2019-01-17 MED ORDER — HEPARIN SOD (PORK) LOCK FLUSH 100 UNIT/ML IV SOLN
250.0000 [IU] | INTRAVENOUS | Status: AC | PRN
  Administered 2019-01-17: 250 [IU]
  Filled 2019-01-17: qty 5

## 2019-01-17 MED FILL — PROMACTA 50 MG TABLET: 50 | 30 days supply | Qty: 30 | Fill #0

## 2019-01-17 NOTE — Telephone Encounter (Signed)
thanks

## 2019-01-17 NOTE — Telephone Encounter (Signed)
Oral Oncology Patient Advocate Encounter  Confirmed with [place] that Meagan Walsh was picked up on 01/17/2019 with a $0 copay using the copay card.   [place] Patient [place] Phone [phone] Fax [phone] 01/17/2019   2:22 PM

## 2019-01-17 NOTE — Patient Instructions (Signed)
Platelet Transfusion A platelet transfusion is a procedure in which you receive donated platelets through an IV. Platelets are tiny pieces of blood cells. When you get an injury, platelets clump together in the area to form a blood clot. This helps stop bleeding and is the beginning of the healing process. If you have too few platelets, your blood may have trouble clotting. This may cause you to bleed and bruise very easily. You may need a platelet transfusion if you have a condition that causes a low number of platelets (thrombocytopenia). A platelet transfusion may be used to stop or prevent excessive bleeding. Tell a health care provider about:  Any reactions you have had during previous transfusions.  Any allergies you have.  All medicines you are taking, including vitamins, herbs, eye drops, creams, and over-the-counter medicines.  Any blood disorders you have.  Any surgeries you have had.  Any medical conditions you have.  Whether you are pregnant or may be pregnant. What are the risks? Generally, this is a safe procedure. However, problems may occur, including:  Fever.  Infection.  Allergic reaction to the donor platelets.  Your body's disease-fighting system (immune system) attacking the donor platelets (hemolytic reaction). This is rare.  A rare reaction that causes lung damage (transfusion-related acute lung injury). What happens before the procedure? Medicines  Ask your health care provider about: ? Changing or stopping your regular medicines. This is especially important if you are taking diabetes medicines or blood thinners. ? Taking medicines such as aspirin and ibuprofen. These medicines can thin your blood. Do not take these medicines unless your health care provider tells you to take them. ? Taking over-the-counter medicines, vitamins, herbs, and supplements. General instructions  You will have a blood test to determine your blood type. Your blood type  determines what kind of platelets you will be given.  Follow instructions from your health care provider about eating or drinking restrictions.  If you have had an allergic reaction to a transfusion in the past, you may be given medicine to help prevent a reaction.  Your temperature, blood pressure, pulse, and breathing will be monitored. What happens during the procedure?   An IV will be inserted into one of your veins.  For your safety, two health care providers will verify your identity along with the donor platelets about to be infused.  A bag of donor platelets will be connected to your IV. The platelets will flow into your bloodstream. This usually takes 30-60 minutes.  Your temperature, blood pressure, pulse, and breathing will be monitored during the transfusion. This helps detect early signs of any reaction.  You will also be monitored for other symptoms that may indicate a reaction, including chills, hives, or itching.  If you have signs of a reaction at any time, your transfusion will be stopped, and you may be given medicine to help manage the reaction.  When your transfusion is complete, your IV will be removed.  Pressure may be applied to the IV site for a few minutes to stop any bleeding.  The IV site will be covered with a bandage (dressing). The procedure may vary among health care providers and hospitals. What happens after the procedure?  Your blood pressure, temperature, pulse, and breathing will be monitored until you leave the hospital or clinic.  You may have some bruising and soreness at your IV site. Follow these instructions at home: Medicines  Take over-the-counter and prescription medicines only as told by your health care provider.  Talk with your health care provider before you take any medicines that contain aspirin or NSAIDs. These medicines increase your risk for dangerous bleeding. General instructions  Change or remove your dressing as told  by your health care provider.  Return to your normal activities as told by your health care provider. Ask your health care provider what activities are safe for you.  Do not take baths, swim, or use a hot tub until your health care provider approves. Ask your health care provider if you may take showers.  Check your IV site every day for signs of infection. Check for: ? Redness, swelling, or pain. ? Fluid or blood. If fluid or blood drains from your IV site, use your hands to press down firmly on a bandage covering the area for a minute or two. Doing this should stop the bleeding. ? Warmth. ? Pus or a bad smell.  Keep all follow-up visits as told by your health care provider. This is important. Contact a health care provider if you have:  A headache that does not go away with medicine.  Hives, rash, or itchy skin.  Nausea or vomiting.  Unusual tiredness or weakness.  Signs of infection at your IV site. Get help right away if:  You have a fever or chills.  You urinate less often than usual.  Your urine is darker colored than normal.  You have any of the following: ? Trouble breathing. ? Pain in your back, abdomen, or chest. ? Cool, clammy skin. ? A fast heartbeat. Summary  Platelets are tiny pieces of blood cells that clump together to form a blood clot when you have an injury. If you have too few platelets, your blood may have trouble clotting.  A platelet transfusion is a procedure in which you receive donated platelets through an IV.  A platelet transfusion may be used to stop or prevent excessive bleeding.  After the procedure, check your IV site every day for signs of infection, including redness, swelling, pain, or warmth. This information is not intended to replace advice given to you by your health care provider. Make sure you discuss any questions you have with your health care provider. Document Released: 05/07/2007 Document Revised: 08/15/2017 Document  Reviewed: 08/15/2017 Elsevier Interactive Patient Education  2019 [place] (COVID-19) Are you at risk?  Are you at risk for the Coronavirus (COVID-19)?  To be considered HIGH RISK for Coronavirus (COVID-19), you have to meet the following criteria:  . Traveled to [place], [place], [place], [place] or [place]; or in the [place] to [place], [place], [place], or [place]; and have fever, cough, and shortness of breath within the last 2 weeks of travel OR . Been in close contact with a person diagnosed with COVID-19 within the last 2 weeks and have fever, cough, and shortness of breath . IF YOU DO NOT MEET THESE CRITERIA, YOU ARE CONSIDERED LOW RISK FOR COVID-19.  What to do if you are HIGH RISK for COVID-19?  [name] If you are having a medical emergency, call 911. . Seek medical care right away. Before you go to a doctor's office, urgent care or emergency department, call ahead and tell them about your recent travel, contact with someone diagnosed with COVID-19, and your symptoms. You should receive instructions from your physician's office regarding next steps of care.  . When you arrive at healthcare provider, tell the healthcare staff immediately you have returned from visiting [place], [place], [place], [place] or [place]; or traveled in the [place] to [place], [place], [place], or [place]; in the last two weeks or you have been in close contact with a person diagnosed with COVID-19 in the last 2 weeks.   . Tell the health care staff about your symptoms: fever, cough and shortness of breath. . After you have been seen by a medical provider, you will be either: o Tested for (COVID-19) and discharged home on quarantine except to seek medical care if symptoms worsen, and asked to  - Stay home and avoid contact with others until you get your results (4-5 days)  - Avoid travel on public transportation if possible (such as bus, train, or airplane) or o Sent to  the Emergency Department by EMS for evaluation, COVID-19 testing, and possible admission depending on your condition and test results.  What to do if you are LOW RISK for COVID-19?  Reduce your risk of any infection by using the same precautions used for avoiding the common cold or flu:  [name] Wash your hands often with soap and warm water for at least 20 seconds.  If soap and water are not readily available, use an alcohol-based hand sanitizer with at least 60% alcohol.  . If coughing or sneezing, cover your mouth and nose by coughing or sneezing into the elbow areas of your shirt or coat, into a tissue or into your sleeve (not your hands). . Avoid shaking hands with others and consider head nods or verbal greetings only. . Avoid touching your eyes, nose, or mouth with unwashed hands.  . Avoid close contact with people who are sick. . Avoid places or events with large numbers of people in one location, like concerts or sporting events. . Carefully consider travel plans you have or are making. . If you are planning any travel outside or inside the [place], visit the CDC's Travelers' Health webpage for the latest health notices. . If you have some symptoms but not all symptoms, continue to monitor at home and seek medical attention if your symptoms worsen. . If you are having a medical emergency, call 911.   [place] / e-Visit: [email or web address]         [facility]: [facility]: [phone]                   [facility]: [phone]

## 2019-01-17 NOTE — Progress Notes (Signed)
Informed patient that she can stop the Levaquin now per Dr. Benay Spice since [facility] is improved.

## 2019-01-17 NOTE — Progress Notes (Signed)
Called blood bank and confirmed they have platelets in house today.

## 2019-01-18 LAB — BPAM PLATELET PHERESIS
Blood Product Expiration Date: 202006282359
ISSUE DATE / TIME: 202006261208
Unit Type and Rh: 5100

## 2019-01-18 LAB — PREPARE PLATELET PHERESIS: Unit division: 0

## 2019-01-19 ENCOUNTER — Other Ambulatory Visit: Payer: Self-pay | Admitting: Oncology

## 2019-01-20 ENCOUNTER — Other Ambulatory Visit: Payer: Self-pay

## 2019-01-20 ENCOUNTER — Inpatient Hospital Stay: Payer: BC Managed Care – PPO

## 2019-01-20 ENCOUNTER — Other Ambulatory Visit: Payer: Self-pay | Admitting: *Deleted

## 2019-01-20 ENCOUNTER — Other Ambulatory Visit: Payer: Self-pay | Admitting: Oncology

## 2019-01-20 DIAGNOSIS — D801 Nonfamilial hypogammaglobulinemia: Secondary | ICD-10-CM | POA: Diagnosis not present

## 2019-01-20 DIAGNOSIS — C9110 Chronic lymphocytic leukemia of B-cell type not having achieved remission: Secondary | ICD-10-CM | POA: Diagnosis not present

## 2019-01-20 DIAGNOSIS — Z79899 Other long term (current) drug therapy: Secondary | ICD-10-CM | POA: Diagnosis not present

## 2019-01-20 DIAGNOSIS — F329 Major depressive disorder, single episode, unspecified: Secondary | ICD-10-CM | POA: Diagnosis not present

## 2019-01-20 DIAGNOSIS — G4700 Insomnia, unspecified: Secondary | ICD-10-CM | POA: Diagnosis not present

## 2019-01-20 DIAGNOSIS — D61818 Other pancytopenia: Secondary | ICD-10-CM | POA: Diagnosis not present

## 2019-01-20 DIAGNOSIS — D696 Thrombocytopenia, unspecified: Secondary | ICD-10-CM

## 2019-01-20 DIAGNOSIS — J189 Pneumonia, unspecified organism: Secondary | ICD-10-CM | POA: Diagnosis not present

## 2019-01-20 DIAGNOSIS — M542 Cervicalgia: Secondary | ICD-10-CM | POA: Diagnosis not present

## 2019-01-20 DIAGNOSIS — R161 Splenomegaly, not elsewhere classified: Secondary | ICD-10-CM | POA: Diagnosis not present

## 2019-01-20 DIAGNOSIS — N6331 Unspecified lump in axillary tail of the right breast: Secondary | ICD-10-CM | POA: Diagnosis not present

## 2019-01-20 DIAGNOSIS — Z95828 Presence of other vascular implants and grafts: Secondary | ICD-10-CM

## 2019-01-20 DIAGNOSIS — E039 Hypothyroidism, unspecified: Secondary | ICD-10-CM | POA: Diagnosis not present

## 2019-01-20 DIAGNOSIS — R0602 Shortness of breath: Secondary | ICD-10-CM | POA: Diagnosis not present

## 2019-01-20 DIAGNOSIS — I341 Nonrheumatic mitral (valve) prolapse: Secondary | ICD-10-CM | POA: Diagnosis not present

## 2019-01-20 LAB — RETICULOCYTES
Immature Retic Fract: 18.6 % — ABNORMAL HIGH (ref 2.3–15.9)
RBC.: 3.06 MIL/uL — ABNORMAL LOW (ref 3.87–5.11)
Retic Count, Absolute: 5.8 10*3/uL — ABNORMAL LOW (ref 19.0–186.0)
Retic Ct Pct: <0.4 % — ABNORMAL LOW (ref 0.4–3.1)

## 2019-01-20 LAB — CBC WITH DIFFERENTIAL (CANCER CENTER ONLY)
Abs Immature Granulocytes: 0 10*3/uL (ref 0.00–0.07)
Basophils Absolute: 0 10*3/uL (ref 0.0–0.1)
Basophils Relative: 0 %
Eosinophils Absolute: 0 10*3/uL (ref 0.0–0.5)
Eosinophils Relative: 0 %
HCT: 26.1 % — ABNORMAL LOW (ref 36.0–46.0)
Hemoglobin: 8.9 g/dL — ABNORMAL LOW (ref 12.0–15.0)
Immature Granulocytes: 0 %
Lymphocytes Relative: 89 %
Lymphs Abs: 6.8 10*3/uL — ABNORMAL HIGH (ref 0.7–4.0)
MCH: 29.1 pg (ref 26.0–34.0)
MCHC: 34.1 g/dL (ref 30.0–36.0)
MCV: 85.3 fL (ref 80.0–100.0)
Monocytes Absolute: 0.1 10*3/uL (ref 0.1–1.0)
Monocytes Relative: 2 %
Neutro Abs: 0.7 10*3/uL — ABNORMAL LOW (ref 1.7–7.7)
Neutrophils Relative %: 9 %
Platelet Count: 27 10*3/uL — ABNORMAL LOW (ref 150–400)
RBC: 3.06 MIL/uL — ABNORMAL LOW (ref 3.87–5.11)
RDW: 11.9 % (ref 11.5–15.5)
WBC Count: 7.7 10*3/uL (ref 4.0–10.5)
nRBC: 0 % (ref 0.0–0.2)

## 2019-01-20 MED ORDER — SODIUM CHLORIDE 0.9% FLUSH
10.0000 mL | INTRAVENOUS | Status: DC | PRN
  Administered 2019-01-20: 10 mL
  Filled 2019-01-20: qty 10

## 2019-01-20 MED ORDER — HEPARIN SOD (PORK) LOCK FLUSH 100 UNIT/ML IV SOLN
500.0000 [IU] | Freq: Once | INTRAVENOUS | Status: AC | PRN
  Administered 2019-01-20: 500 [IU]
  Filled 2019-01-20: qty 5

## 2019-01-21 ENCOUNTER — Other Ambulatory Visit: Admit: 2019-01-21 | Discharge: 2019-01-22 | Payer: PRIVATE HEALTH INSURANCE

## 2019-01-21 ENCOUNTER — Ambulatory Visit
Admit: 2019-01-21 | Discharge: 2019-01-22 | Payer: PRIVATE HEALTH INSURANCE | Attending: Hematology & Oncology | Primary: Hematology & Oncology

## 2019-01-21 DIAGNOSIS — E039 Hypothyroidism, unspecified: Secondary | ICD-10-CM | POA: Diagnosis not present

## 2019-01-21 DIAGNOSIS — C9110 Chronic lymphocytic leukemia of B-cell type not having achieved remission: Secondary | ICD-10-CM | POA: Diagnosis not present

## 2019-01-21 DIAGNOSIS — Z6821 Body mass index (BMI) 21.0-21.9, adult: Secondary | ICD-10-CM | POA: Diagnosis not present

## 2019-01-21 DIAGNOSIS — Z9221 Personal history of antineoplastic chemotherapy: Secondary | ICD-10-CM | POA: Diagnosis not present

## 2019-01-21 DIAGNOSIS — D801 Nonfamilial hypogammaglobulinemia: Secondary | ICD-10-CM | POA: Diagnosis not present

## 2019-01-21 DIAGNOSIS — B1910 Unspecified viral hepatitis B without hepatic coma: Secondary | ICD-10-CM | POA: Diagnosis not present

## 2019-01-21 MED ORDER — PREDNISONE 10 MG TABLET
ORAL_TABLET | 0 refills | 0 days | Status: CP
Start: 2019-01-21 — End: 2019-03-22

## 2019-01-22 ENCOUNTER — Telehealth: Payer: Self-pay | Admitting: Oncology

## 2019-01-22 ENCOUNTER — Inpatient Hospital Stay: Payer: BC Managed Care – PPO | Admitting: Nurse Practitioner

## 2019-01-22 ENCOUNTER — Inpatient Hospital Stay: Payer: BC Managed Care – PPO

## 2019-01-22 ENCOUNTER — Other Ambulatory Visit: Payer: Self-pay | Admitting: Nurse Practitioner

## 2019-01-22 DIAGNOSIS — C9110 Chronic lymphocytic leukemia of B-cell type not having achieved remission: Secondary | ICD-10-CM

## 2019-01-22 NOTE — Telephone Encounter (Signed)
R/s appt per 7/1 sch message- pt aware of new time and date.

## 2019-01-23 ENCOUNTER — Other Ambulatory Visit: Payer: Self-pay

## 2019-01-23 ENCOUNTER — Inpatient Hospital Stay (HOSPITAL_BASED_OUTPATIENT_CLINIC_OR_DEPARTMENT_OTHER): Payer: BC Managed Care – PPO | Admitting: Nurse Practitioner

## 2019-01-23 ENCOUNTER — Encounter: Payer: Self-pay | Admitting: Nurse Practitioner

## 2019-01-23 ENCOUNTER — Inpatient Hospital Stay: Payer: BC Managed Care – PPO | Attending: Oncology

## 2019-01-23 ENCOUNTER — Telehealth: Payer: Self-pay | Admitting: Nurse Practitioner

## 2019-01-23 ENCOUNTER — Inpatient Hospital Stay: Payer: BC Managed Care – PPO

## 2019-01-23 VITALS — BP 120/55 | HR 85 | Temp 98.2°F | Resp 18 | Ht 66.0 in | Wt 133.3 lb

## 2019-01-23 DIAGNOSIS — R0602 Shortness of breath: Secondary | ICD-10-CM | POA: Insufficient documentation

## 2019-01-23 DIAGNOSIS — D801 Nonfamilial hypogammaglobulinemia: Secondary | ICD-10-CM | POA: Insufficient documentation

## 2019-01-23 DIAGNOSIS — M7989 Other specified soft tissue disorders: Secondary | ICD-10-CM | POA: Diagnosis not present

## 2019-01-23 DIAGNOSIS — E039 Hypothyroidism, unspecified: Secondary | ICD-10-CM

## 2019-01-23 DIAGNOSIS — Z95828 Presence of other vascular implants and grafts: Secondary | ICD-10-CM

## 2019-01-23 DIAGNOSIS — Z85820 Personal history of malignant melanoma of skin: Secondary | ICD-10-CM | POA: Diagnosis not present

## 2019-01-23 DIAGNOSIS — D696 Thrombocytopenia, unspecified: Secondary | ICD-10-CM

## 2019-01-23 DIAGNOSIS — I341 Nonrheumatic mitral (valve) prolapse: Secondary | ICD-10-CM | POA: Insufficient documentation

## 2019-01-23 DIAGNOSIS — M542 Cervicalgia: Secondary | ICD-10-CM

## 2019-01-23 DIAGNOSIS — D61818 Other pancytopenia: Secondary | ICD-10-CM | POA: Diagnosis not present

## 2019-01-23 DIAGNOSIS — B1910 Unspecified viral hepatitis B without hepatic coma: Secondary | ICD-10-CM | POA: Insufficient documentation

## 2019-01-23 DIAGNOSIS — F329 Major depressive disorder, single episode, unspecified: Secondary | ICD-10-CM | POA: Diagnosis not present

## 2019-01-23 DIAGNOSIS — C9110 Chronic lymphocytic leukemia of B-cell type not having achieved remission: Secondary | ICD-10-CM | POA: Insufficient documentation

## 2019-01-23 DIAGNOSIS — R161 Splenomegaly, not elsewhere classified: Secondary | ICD-10-CM | POA: Diagnosis not present

## 2019-01-23 DIAGNOSIS — Z79899 Other long term (current) drug therapy: Secondary | ICD-10-CM | POA: Diagnosis not present

## 2019-01-23 DIAGNOSIS — D649 Anemia, unspecified: Secondary | ICD-10-CM | POA: Insufficient documentation

## 2019-01-23 LAB — CBC WITH DIFFERENTIAL (CANCER CENTER ONLY)
Abs Immature Granulocytes: 0.02 10*3/uL (ref 0.00–0.07)
Basophils Absolute: 0 10*3/uL (ref 0.0–0.1)
Basophils Relative: 0 %
Eosinophils Absolute: 0 10*3/uL (ref 0.0–0.5)
Eosinophils Relative: 0 %
HCT: 24.2 % — ABNORMAL LOW (ref 36.0–46.0)
Hemoglobin: 8.1 g/dL — ABNORMAL LOW (ref 12.0–15.0)
Immature Granulocytes: 0 %
Lymphocytes Relative: 79 %
Lymphs Abs: 6.1 10*3/uL — ABNORMAL HIGH (ref 0.7–4.0)
MCH: 29 pg (ref 26.0–34.0)
MCHC: 33.5 g/dL (ref 30.0–36.0)
MCV: 86.7 fL (ref 80.0–100.0)
Monocytes Absolute: 0.1 10*3/uL (ref 0.1–1.0)
Monocytes Relative: 1 %
Neutro Abs: 1.6 10*3/uL — ABNORMAL LOW (ref 1.7–7.7)
Neutrophils Relative %: 20 %
Platelet Count: 15 10*3/uL — ABNORMAL LOW (ref 150–400)
RBC: 2.79 MIL/uL — ABNORMAL LOW (ref 3.87–5.11)
RDW: 11.9 % (ref 11.5–15.5)
WBC Count: 7.8 10*3/uL (ref 4.0–10.5)
nRBC: 0 % (ref 0.0–0.2)

## 2019-01-23 LAB — SAMPLE TO BLOOD BANK

## 2019-01-23 LAB — SAVE SMEAR(SSMR), FOR PROVIDER SLIDE REVIEW

## 2019-01-23 MED ORDER — HEPARIN SOD (PORK) LOCK FLUSH 100 UNIT/ML IV SOLN
500.0000 [IU] | Freq: Once | INTRAVENOUS | Status: AC | PRN
  Administered 2019-01-23: 250 [IU]
  Filled 2019-01-23: qty 5

## 2019-01-23 MED ORDER — SODIUM CHLORIDE 0.9% FLUSH
10.0000 mL | INTRAVENOUS | Status: DC | PRN
  Administered 2019-01-23: 10 mL
  Filled 2019-01-23: qty 10

## 2019-01-23 MED ORDER — PREDNISONE 20 MG PO TABS: ORAL_TABLET | ORAL | 0 refills | Status: DC

## 2019-01-23 NOTE — Telephone Encounter (Signed)
Scheduled appt per 7/2 los. Spoke with patient and she is aware of her appt date and time. °

## 2019-01-23 NOTE — Progress Notes (Addendum)
[place]   [phone] [phone]   Clinic Follow up Note   Patient Care Team: Lavone Orn, MD as PCP - General (Internal Medicine) 01/23/2019  CHIEF COMPLAINT: CLL  INTERVAL HISTORY: [name] returns for f/u as scheduled. She was last seen in clinic 01/15/19. She followed up with Dr. Prince Solian at [facility] on 01/21/19 in the interval. Fluconazole and levaquin were stopped. Last platelet transfusion 01/17/19, last RBC 01/03/19. She continues promacta, denies bleeding or new bruising. Denies new adeonpathy. Denies fever, chills, weight loss, night sweats. She continues prednisone 60 mg daily, her mood is irritable later in the day. Denies GERD, n/v/c/d. She feels more short of breath on exertion in last 3-4 days. She is able to complete tasks and ADLs with some effort. Denies cough, chest pain, palpitations, dizziness, headache.    MEDICAL HISTORY:  Past Medical History:  Diagnosis Date  . Anxiety   . Cancer ([place])    CLL  . Degenerative disk disease    Neck--chronic pain  . Depression   . Fibroids    Uterine  . 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 Hypothyroidism   . Lymphocytosis   . Mitral valve prolapse   . Mitral valve prolapse   . Thyroid disease    Hypothyroid    SURGICAL HISTORY: Past Surgical History:  Procedure Laterality Date  . BREAST BIOPSY Left 2000  . CESAREAN SECTION  1996  . MELANOMA EXCISION WITH SENTINEL LYMPH NODE BIOPSY Left 10/01/2017   Procedure: EXCISIONAL BIOPSY OF LEFT INGUINAL LYMPH NODE;  Surgeon: Greer Pickerel, MD;  Location: [facility];  Service: General;  Laterality: Left;  . REVISION OF ABDOMINAL SCAR    . TONSILLECTOMY     age 58    I have reviewed the social history and family history with the patient and they are unchanged from previous note.  ALLERGIES:  has No Known Allergies.  MEDICATIONS:  Current Outpatient Medications  Medication Sig Dispense Refill  . acetaminophen  (TYLENOL) 325 MG tablet Take 650 mg by mouth every 6 (six) hours as needed.    . ALPRAZolam (XANAX) 0.25 MG tablet TAKE 1 TO 2 TABLETS BY MOUTH EVERY DAY AS NEEDED FOR ANXIETY 30 tablet 3  . aminocaproic acid (AMICAR) 500 MG tablet Take 2 tablets (1,000 mg total) by mouth every 8 (eight) hours. 180 tablet 1  . eltrombopag (PROMACTA) 50 MG tablet Take 1 tablet (50 mg total) by mouth daily. Take on an empty stomach 1 hour before a meal or 2 hours after 30 tablet 0  . folic acid (FOLVITE) 1 MG tablet Take 1 tablet (1 mg total) by mouth daily. 30 tablet 2  . levonorgestrel (MIRENA) 20 MCG/24HR IUD 1 each by Intrauterine route once.     [name] levothyroxine (SYNTHROID, LEVOTHROID) 100 MCG tablet Take 100 mcg by mouth daily.    . predniSONE (DELTASONE) 20 MG tablet Take 40 mg (2 tabs) daily x1 week, then 30 mg (1.5 tab) daily x1 week, then 20 mg (1 tab) daily x1 week, then 10 mg (0.5 tab) daily x1 week 35 tablet 0  . valACYclovir (VALTREX) 500 MG tablet TAKE 1 TABLET BY MOUTH TWICE A DAY 60 tablet 0  . vortioxetine HBr (TRINTELLIX) 20 MG TABS tablet Take 1 tablet (20 mg total) by mouth daily. 90 tablet 0  . zolpidem (AMBIEN) 10 MG tablet TAKE 1/2 TO 1 TABLET BY MOUTH AS NEEDED FOR INSOMNIA. 30 tablet 5  .  Ibuprofen 200 MG CAPS Take 400 mg by mouth as needed.     No current facility-administered medications for this visit.     PHYSICAL EXAMINATION: ECOG PERFORMANCE STATUS: 1 - Symptomatic but completely ambulatory  Vitals:   01/23/19 1058  BP: (!) 120/55  Pulse: 85  Resp: 18  Temp: 98.2 F (36.8 C)  SpO2: 100%   Filed Weights   01/23/19 1058  Weight: 133 lb 4.8 oz (60.5 kg)    GENERAL:alert, no distress and comfortable SKIN: no rash. Previous healing bruising to PICC site and lower right leg. No new bruising  EYES: sclera clear OROPHARYNX: no thrush. Tiny ecchymoses to left upper buccal surface near back teeth  LUNGS: respirations even and unlabored  HEART: no lower extremity edema  Musculoskeletal:no cyanosis of digits NEURO: alert & oriented x 3 with fluent speech, normal gait PICC without erythema   Limited exam for covid19 outbreak   LABORATORY DATA:  I have reviewed the data as listed CBC Latest Ref Rng & Units 01/23/2019 01/20/2019 01/17/2019  WBC 4.0 - 10.5 K/uL 7.8 7.7 8.5  Hemoglobin 12.0 - 15.0 g/dL 8.1(L) 8.9(L) 9.2(L)  Hematocrit 36.0 - 46.0 % 24.2(L) 26.1(L) 26.9(L)  Platelets 150 - 400 K/uL 15(L) 27(L) 11(L)     CMP Latest Ref Rng & Units 01/13/2019 12/27/2018 12/20/2018  Glucose 70 - 99 mg/dL 117(H) 102(H) 115(H)  BUN 6 - 20 mg/dL 21(H) 20 22(H)  Creatinine 0.44 - 1.00 mg/dL 0.79 0.78 0.81  Sodium 135 - 145 mmol/L 143 143 141  Potassium 3.5 - 5.1 mmol/L 4.0 4.4 4.1  Chloride 98 - 111 mmol/L 109 109 108  CO2 22 - 32 mmol/L _0 Calcium 8.9 - 10.3 mg/dL 9.7 9.6 9.6  Total Protein 6.5 - 8.1 g/dL 6.2(L) 6.3(L) 6.3(L)  Total Bilirubin 0.3 - 1.2 mg/dL 0.6 0.6 0.6  Alkaline Phos 38 - 126 U/L 82 103 111  AST 15 - 41 U/L 16 13(L) 12(L)  ALT 0 - 44 U/L _1 RADIOGRAPHIC STUDIES: I have personally reviewed the radiological images as listed and agreed with the findings in the report. No results found.   ASSESSMENT & PLAN:  1.CLL-diagnosed in August 2010, flow cytometry consistent with CLL Enlarged leftinguinal lymph node January 2019,smallneck/axillary nodes and palpable splenomegaly 09/11/2017  CTson 09/17/2017-3 cm necrotic appearing lymph node in the left inguinal region, borderline enlarged pelvic/retroperitoneal, chest, and axillary nodes. Mild splenomegaly.  Ultrasound-guided biopsy of the left inguinal lymph node 09/18/2017, slightly "purulent "fluid aspirated, core biopsy is consistent with an atypical lymphoid proliferation-extensive necrosis with surrounding epithelioid histiocytes, limited intact lymphoid tissue involved with CLL  Incisional biopsy of a necrotic/purulent left inguinal lymph node on 10/01/2017-extensive necrosis  with granulomatous inflammation, small amount of viable lymphoid tissue involved with CLL, AFB and fungal stains negative  Peripheral blood FISH analysis 02/05/2018-deletion 13q14, no evidence of p53 (17p13) deletion, no evidence of 11q22deletion  Bone marrow biopsy 02/26/2018-hypercellular marrow with extensive involvement by CLL, lymphocytes represent85% of all cells  Ibrutinib initiated 04/03/2018  Ibrutinib placed on hold 04/11/2018 due to onset of arthralgias  Ibrutinib resumed 04/16/2018, discontinued 04/25/2018 secondary to severe arthralgias/arthritis  Ibrutinib resumed at a dose of 140 mg daily 05/03/2018  Ibrutinib dose adjusted to 140 mg alternating with 237m 06/25/2018  Ibrutinib discontinued 07/03/2018 secondary to severe arthralgias  Acalabrutinib 08/16/2018, discontinued 11/15/2018 secondary to persistent severe transfusion dependent anemia and neutropenia/thrombocytopenia, last dose 11/14/2018  Bone marrow biopsy 11/21/2018-decreased cellularity, involvement  by CLL, decreased erythroid and granulocytic precursors, decreased megakaryocytes  Cycle 1 rituximab 12/06/2018  Bone marrow biopsy 12/24/2018 at [facility]-hypocellular bone marrow (10%) involved by CLL, representing 50% of marrow cellularity; markedly decreased trilineage hematopoiesis including essentially absent erythropoiesis  2.Hypothyroidism 3.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 Augmentin prescribed. 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 Pancytopenia secondary to CLL and a hypocellular bone marrow  G-CSF and  Nplate started 9/62/2297, G-CSF changed to daily beginning 12/17/2018; G-CSF discontinued 12/31/2018   Began prednisone 60 mg daily 01/11/19, tolerating moderately well except jitteriness, irritability, and difficulty sleeping. Tapered to 40 mg daily x1 week starting 01/24/19, reduce by 10 mg each week until discontinued   Disposition: Mr. Larusso appears well. She has become more dyspneic on exertion recently, Hb 8.1; last RBC transfusion nearly 3 weeks ago. Will transfuse 1 unit on 01/24/19. PLT 15K, no bleeding. No platelet transfusion. Continue promacta, will check CMP to monitor LFTs with next lab draw. Continue folic acid, amicar. ANC improved to 1.6, she continues valtrex, off diflucan and levaquin now. She will begin prednisone taper to 40 mg daily starting 7/3 x1 week and reduce by 10 mg each week until discontinued. Refill sent to pharmacy.   Ms. Swantek will have CBC w diff checked at [facility] outpatient lab in [place], [place] on 01/27/19, those results will be faxed back to [place]. When she returns from her vacation, she will have PICC flush and dressing change on 7/11. She will return for lab and f/u with Dr. Benay Spice on 7/13. Patient agrees with the plan. The patient was seen with Dr. Benay Spice.    Orders Placed This Encounter  Procedures  . CBC with Differential (Cancer Center Only)    Standing Status:   Future    Standing Expiration Date:   01/23/2020  . Reticulocytes    Standing Status:   Future    Standing Expiration Date:   01/23/2020  . Save Smear (SSMR)    Standing Status:   Future    Standing Expiration Date:   01/23/2020  . CMP ([employer] only)    Standing Status:   Future    Standing Expiration Date:   01/23/2020  . Practitioner attestation of consent    I, the ordering practitioner, attest that I have discussed with the patient the benefits, risks, side effects, alternatives, likelihood of achieving goals and potential problems during recovery for the procedure listed.    Standing  Status:   Future    Standing Expiration Date:   01/23/2020    Order Specific Question:   Procedure    Answer:   Blood Product(s)  . Complete patient signature process for consent form    Standing Status:   Future    Standing Expiration Date:   01/23/2020  . Care order/instruction    Transfuse Parameters    Standing Status:   Future    Standing Expiration Date:   01/23/2020  . Sample to Blood Bank    Standing Status:   Future    Standing Expiration Date:   01/23/2020  . Type and screen    Standing Status:   Future    Standing Expiration Date:   01/23/2020   All questions were answered.  The patient knows to call the clinic with any problems, questions or concerns. No barriers to learning was detected.     Alla Feeling, NP 01/23/19  This was a shared visit with Cira Rue.  Ms. Coor appears well.  The pancytopenia has improved over the past few weeks.  I discussed the case with Dr. Prince Solian yesterday.  The plan is to continue Promacta, folic acid, Amicar, and a steroid taper.  She will continue Valtrex prophylaxis.  [place].  She will return for 1 unit of packed red blood cells on 01/24/2019.  We will make arrangements for a CBC to be checked while in [place] on 01/27/2019 with the results to be forwarded to [place].  We will arrange for additional laboratory studies and transfusion support as needed.  She will be scheduled for office visit here on 02/03/2019.  [name], MD

## 2019-01-24 ENCOUNTER — Other Ambulatory Visit: Payer: Self-pay

## 2019-01-24 ENCOUNTER — Inpatient Hospital Stay: Payer: BC Managed Care – PPO

## 2019-01-24 DIAGNOSIS — D801 Nonfamilial hypogammaglobulinemia: Secondary | ICD-10-CM | POA: Diagnosis not present

## 2019-01-24 DIAGNOSIS — F329 Major depressive disorder, single episode, unspecified: Secondary | ICD-10-CM | POA: Diagnosis not present

## 2019-01-24 DIAGNOSIS — R161 Splenomegaly, not elsewhere classified: Secondary | ICD-10-CM | POA: Diagnosis not present

## 2019-01-24 DIAGNOSIS — D649 Anemia, unspecified: Secondary | ICD-10-CM | POA: Diagnosis not present

## 2019-01-24 DIAGNOSIS — C9110 Chronic lymphocytic leukemia of B-cell type not having achieved remission: Secondary | ICD-10-CM | POA: Diagnosis not present

## 2019-01-24 DIAGNOSIS — Z85820 Personal history of malignant melanoma of skin: Secondary | ICD-10-CM | POA: Diagnosis not present

## 2019-01-24 DIAGNOSIS — B1910 Unspecified viral hepatitis B without hepatic coma: Secondary | ICD-10-CM | POA: Diagnosis not present

## 2019-01-24 DIAGNOSIS — E039 Hypothyroidism, unspecified: Secondary | ICD-10-CM | POA: Diagnosis not present

## 2019-01-24 DIAGNOSIS — D61818 Other pancytopenia: Secondary | ICD-10-CM | POA: Diagnosis not present

## 2019-01-24 DIAGNOSIS — M542 Cervicalgia: Secondary | ICD-10-CM | POA: Diagnosis not present

## 2019-01-24 DIAGNOSIS — I341 Nonrheumatic mitral (valve) prolapse: Secondary | ICD-10-CM | POA: Diagnosis not present

## 2019-01-24 DIAGNOSIS — M7989 Other specified soft tissue disorders: Secondary | ICD-10-CM | POA: Diagnosis not present

## 2019-01-24 DIAGNOSIS — Z79899 Other long term (current) drug therapy: Secondary | ICD-10-CM | POA: Diagnosis not present

## 2019-01-24 DIAGNOSIS — R0602 Shortness of breath: Secondary | ICD-10-CM | POA: Diagnosis not present

## 2019-01-24 LAB — PREPARE RBC (CROSSMATCH)

## 2019-01-24 MED ORDER — HEPARIN SOD (PORK) LOCK FLUSH 100 UNIT/ML IV SOLN
500.0000 [IU] | Freq: Once | INTRAVENOUS | Status: AC
  Administered 2019-01-24: 12:00:00 500 [IU] via INTRAVENOUS
  Filled 2019-01-24: qty 5

## 2019-01-24 MED ORDER — HEPARIN SOD (PORK) LOCK FLUSH 100 UNIT/ML IV SOLN
250.0000 [IU] | INTRAVENOUS | Status: DC | PRN
  Filled 2019-01-24: qty 5

## 2019-01-24 MED ORDER — SODIUM CHLORIDE 0.9% FLUSH
10.0000 mL | Freq: Once | INTRAVENOUS | Status: AC
  Administered 2019-01-24: 10 mL via INTRAVENOUS
  Filled 2019-01-24: qty 10

## 2019-01-24 MED ORDER — SODIUM CHLORIDE 0.9% FLUSH
3.0000 mL | INTRAVENOUS | Status: DC | PRN
  Filled 2019-01-24: qty 10

## 2019-01-24 MED ORDER — SODIUM CHLORIDE 0.9% IV SOLUTION
250.0000 mL | Freq: Once | INTRAVENOUS | Status: AC
  Administered 2019-01-24: 250 mL via INTRAVENOUS
  Filled 2019-01-24: qty 250

## 2019-01-24 NOTE — Patient Instructions (Signed)
Blood Transfusion, Adult A blood transfusion is a procedure in which you are given blood through an IV tube. You may need this procedure because of:  Illness.  Surgery.  Injury. The blood may come from someone else (a donor). You may also be able to donate blood for yourself (autologous blood donation). The blood given in a transfusion is made up of different types of cells. You may get:  Red blood cells. These carry oxygen to the cells in the body.  White blood cells. These help you fight infections.  Platelets. These help your blood to clot.  Plasma. This is the liquid part of your blood. It helps with fluid imbalances. If you have a clotting disorder, you may also get other types of blood products. What happens before the procedure?  You will have a blood test to find out your blood type. The test also finds out what type of blood your body will accept and matches it to the donor type.  If you are going to have a planned surgery, you may be able to donate your own blood. This may be done in case you need a transfusion.  If you have had an allergic reaction to a transfusion in the past, you may be given medicine to help prevent a reaction. This medicine may be given to you by mouth or through an IV.  You will have your temperature, blood pressure, and pulse checked.  Follow instructions from your doctor about what you cannot eat or drink.  Ask your doctor about: ? Changing or stopping your regular medicines. This is important if you take diabetes medicines or blood thinners. ? Taking medicines such as aspirin and ibuprofen. These medicines can thin your blood. Do not take these medicines before your procedure if your doctor tells you not to. What happens during the procedure?  An IV tube will be put into one of your veins.  The bag of donated blood will be attached to your IV tube. Then, the blood will enter through your vein.  Your temperature, blood pressure, and pulse will  be checked regularly during the procedure. This is done to find early signs of a transfusion reaction.  If you have any signs or symptoms of a reaction, your transfusion will be stopped. You may also be given medicine.  When the transfusion is done, your IV tube will be taken out.  Pressure may be applied to the IV site for a few minutes.  A bandage (dressing) will be put on the IV site. The procedure may vary among doctors and hospitals. What happens after the procedure?  Your temperature, blood pressure, heart rate, breathing rate, and blood oxygen level will be checked often.  Your blood may be tested to see how you are responding to the transfusion.  You may be warmed with fluids or blankets. This is done to keep the temperature of your body normal. Summary  A blood transfusion is a procedure in which you are given blood through an IV tube.  The blood may come from someone else (a donor). You may also be able to donate blood for yourself.  If you have had an allergic reaction to a transfusion in the past, you may be given medicine to help prevent a reaction. This medicine may be given to you by mouth or through an IV tube.  Your temperature, blood pressure, heart rate, breathing rate, and blood oxygen level will be checked often.  Your blood may be tested to see   how you are responding to the transfusion. This information is not intended to replace advice given to you by your health care provider. Make sure you discuss any questions you have with your health care provider. Document Released: 10/06/2008 Document Revised: 08/30/2016 Document Reviewed: 03/03/2016 Elsevier Patient Education  2020 Elsevier Inc.  

## 2019-01-25 LAB — BPAM RBC
Blood Product Expiration Date: 202007312359
ISSUE DATE / TIME: 202007031015
Unit Type and Rh: 5100

## 2019-01-25 LAB — TYPE AND SCREEN
ABO/RH(D): O POS
Antibody Screen: NEGATIVE
Unit division: 0

## 2019-01-27 ENCOUNTER — Telehealth: Payer: Self-pay | Admitting: *Deleted

## 2019-01-27 DIAGNOSIS — R799 Abnormal finding of blood chemistry, unspecified: Secondary | ICD-10-CM | POA: Diagnosis not present

## 2019-01-27 NOTE — Telephone Encounter (Signed)
Received lab results from [facility] labs. Report sent to scan into chart.  Called patient to report results, patient states she feels great. No symptoms of low hgb. Denies any bleeding. She agrees to have labs drawn again on Thursday at same lab. Patient needs new order sent to them on Wednesday evening. Based on those results she will come in on Friday for type and screen for transfusion on Saturday. Dr. Benay Spice advised and agrees with her plan.  [facility] [phone]    Fax [phone]   Patient reports minor edema in her right foot. She is not overly concerned by this. She does have a high salt intake. She admits to decreased water intake since being on vacation. She will increase water intake and prop her feet up in the evening. She will try to monitor her salt intake. She understands to call this office with any further concerns or questions. She also understands to go to the ED locally if she becomes symptomatic of low hgb.

## 2019-01-28 ENCOUNTER — Other Ambulatory Visit: Payer: Self-pay

## 2019-01-28 MED ORDER — VORTIOXETINE HBR 20 MG PO TABS: 20.0000 mg | ORAL_TABLET | Freq: Every day | ORAL | 0 refills | Status: DC

## 2019-01-29 ENCOUNTER — Inpatient Hospital Stay: Payer: BC Managed Care – PPO

## 2019-01-29 ENCOUNTER — Inpatient Hospital Stay: Payer: BC Managed Care – PPO | Admitting: Nurse Practitioner

## 2019-01-30 ENCOUNTER — Telehealth: Payer: Self-pay | Admitting: *Deleted

## 2019-01-30 ENCOUNTER — Encounter: Payer: Self-pay | Admitting: Oncology

## 2019-01-30 DIAGNOSIS — R799 Abnormal finding of blood chemistry, unspecified: Secondary | ICD-10-CM | POA: Diagnosis not present

## 2019-01-30 NOTE — Telephone Encounter (Signed)
Made aware of CBC results today: Hgb 8.3, Pltc 13, and ANC 1.06.  Per Dr. Benay Spice: Decrease prednisone to 30 mg daily and continue Promacta. Return here on 7/11 as scheduled for PICC flush and dressing change. F/U with Dr. Benay Spice on 02/03/19. She understands and agrees.

## 2019-01-31 ENCOUNTER — Other Ambulatory Visit: Payer: BC Managed Care – PPO

## 2019-01-31 ENCOUNTER — Telehealth: Payer: Self-pay | Admitting: *Deleted

## 2019-01-31 ENCOUNTER — Other Ambulatory Visit: Payer: Self-pay | Admitting: Psychiatry

## 2019-01-31 NOTE — Telephone Encounter (Signed)
Called to report she is getting short of breath w/exertion and pulse races at times. Asking if she can reserve spot for transfusion on Monday or Tuesday? Scheduling message sent. Sees MD 7/13

## 2019-02-01 ENCOUNTER — Inpatient Hospital Stay: Payer: BC Managed Care – PPO

## 2019-02-01 ENCOUNTER — Other Ambulatory Visit: Payer: Self-pay

## 2019-02-01 VITALS — Temp 98.9°F

## 2019-02-01 DIAGNOSIS — R0602 Shortness of breath: Secondary | ICD-10-CM | POA: Diagnosis not present

## 2019-02-01 DIAGNOSIS — C9110 Chronic lymphocytic leukemia of B-cell type not having achieved remission: Secondary | ICD-10-CM | POA: Diagnosis not present

## 2019-02-01 DIAGNOSIS — M7989 Other specified soft tissue disorders: Secondary | ICD-10-CM | POA: Diagnosis not present

## 2019-02-01 DIAGNOSIS — E039 Hypothyroidism, unspecified: Secondary | ICD-10-CM | POA: Diagnosis not present

## 2019-02-01 DIAGNOSIS — D649 Anemia, unspecified: Secondary | ICD-10-CM | POA: Diagnosis not present

## 2019-02-01 DIAGNOSIS — B1910 Unspecified viral hepatitis B without hepatic coma: Secondary | ICD-10-CM | POA: Diagnosis not present

## 2019-02-01 DIAGNOSIS — R161 Splenomegaly, not elsewhere classified: Secondary | ICD-10-CM | POA: Diagnosis not present

## 2019-02-01 DIAGNOSIS — I341 Nonrheumatic mitral (valve) prolapse: Secondary | ICD-10-CM | POA: Diagnosis not present

## 2019-02-01 DIAGNOSIS — D801 Nonfamilial hypogammaglobulinemia: Secondary | ICD-10-CM | POA: Diagnosis not present

## 2019-02-01 DIAGNOSIS — D61818 Other pancytopenia: Secondary | ICD-10-CM | POA: Diagnosis not present

## 2019-02-01 DIAGNOSIS — Z85820 Personal history of malignant melanoma of skin: Secondary | ICD-10-CM | POA: Diagnosis not present

## 2019-02-01 DIAGNOSIS — Z79899 Other long term (current) drug therapy: Secondary | ICD-10-CM | POA: Diagnosis not present

## 2019-02-01 DIAGNOSIS — F329 Major depressive disorder, single episode, unspecified: Secondary | ICD-10-CM | POA: Diagnosis not present

## 2019-02-01 DIAGNOSIS — Z95828 Presence of other vascular implants and grafts: Secondary | ICD-10-CM

## 2019-02-01 DIAGNOSIS — D696 Thrombocytopenia, unspecified: Secondary | ICD-10-CM

## 2019-02-01 DIAGNOSIS — M542 Cervicalgia: Secondary | ICD-10-CM | POA: Diagnosis not present

## 2019-02-01 MED ORDER — SODIUM CHLORIDE 0.9% FLUSH
10.0000 mL | INTRAVENOUS | Status: DC | PRN
  Administered 2019-02-01: 10 mL
  Filled 2019-02-01: qty 10

## 2019-02-01 MED ORDER — HEPARIN SOD (PORK) LOCK FLUSH 100 UNIT/ML IV SOLN
500.0000 [IU] | Freq: Once | INTRAVENOUS | Status: AC | PRN
  Administered 2019-02-01: 250 [IU]
  Filled 2019-02-01: qty 5

## 2019-02-03 ENCOUNTER — Telehealth: Payer: Self-pay | Admitting: Pharmacist

## 2019-02-03 ENCOUNTER — Inpatient Hospital Stay: Payer: BC Managed Care – PPO

## 2019-02-03 ENCOUNTER — Other Ambulatory Visit: Payer: Self-pay

## 2019-02-03 ENCOUNTER — Inpatient Hospital Stay (HOSPITAL_BASED_OUTPATIENT_CLINIC_OR_DEPARTMENT_OTHER): Payer: BC Managed Care – PPO | Admitting: Oncology

## 2019-02-03 ENCOUNTER — Other Ambulatory Visit: Payer: Self-pay | Admitting: *Deleted

## 2019-02-03 VITALS — BP 116/70 | HR 90 | Temp 98.7°F | Resp 18 | Ht 66.0 in | Wt 137.8 lb

## 2019-02-03 DIAGNOSIS — D696 Thrombocytopenia, unspecified: Secondary | ICD-10-CM

## 2019-02-03 DIAGNOSIS — D61818 Other pancytopenia: Secondary | ICD-10-CM | POA: Diagnosis not present

## 2019-02-03 DIAGNOSIS — Z79899 Other long term (current) drug therapy: Secondary | ICD-10-CM | POA: Diagnosis not present

## 2019-02-03 DIAGNOSIS — R0602 Shortness of breath: Secondary | ICD-10-CM | POA: Diagnosis not present

## 2019-02-03 DIAGNOSIS — C9110 Chronic lymphocytic leukemia of B-cell type not having achieved remission: Secondary | ICD-10-CM

## 2019-02-03 DIAGNOSIS — R161 Splenomegaly, not elsewhere classified: Secondary | ICD-10-CM

## 2019-02-03 DIAGNOSIS — M7989 Other specified soft tissue disorders: Secondary | ICD-10-CM

## 2019-02-03 DIAGNOSIS — B1910 Unspecified viral hepatitis B without hepatic coma: Secondary | ICD-10-CM

## 2019-02-03 DIAGNOSIS — Z85820 Personal history of malignant melanoma of skin: Secondary | ICD-10-CM | POA: Diagnosis not present

## 2019-02-03 DIAGNOSIS — Z95828 Presence of other vascular implants and grafts: Secondary | ICD-10-CM

## 2019-02-03 DIAGNOSIS — F329 Major depressive disorder, single episode, unspecified: Secondary | ICD-10-CM

## 2019-02-03 DIAGNOSIS — D649 Anemia, unspecified: Secondary | ICD-10-CM | POA: Diagnosis not present

## 2019-02-03 DIAGNOSIS — D801 Nonfamilial hypogammaglobulinemia: Secondary | ICD-10-CM

## 2019-02-03 DIAGNOSIS — M542 Cervicalgia: Secondary | ICD-10-CM | POA: Diagnosis not present

## 2019-02-03 DIAGNOSIS — E039 Hypothyroidism, unspecified: Secondary | ICD-10-CM | POA: Diagnosis not present

## 2019-02-03 DIAGNOSIS — I341 Nonrheumatic mitral (valve) prolapse: Secondary | ICD-10-CM

## 2019-02-03 LAB — CMP (CANCER CENTER ONLY)
ALT: 29 U/L (ref 0–44)
AST: 19 U/L (ref 15–41)
Albumin: 4 g/dL (ref 3.5–5.0)
Alkaline Phosphatase: 68 U/L (ref 38–126)
Anion gap: 8 (ref 5–15)
BUN: 18 mg/dL (ref 6–20)
CO2: 27 mmol/L (ref 22–32)
Calcium: 9.4 mg/dL (ref 8.9–10.3)
Chloride: 107 mmol/L (ref 98–111)
Creatinine: 0.7 mg/dL (ref 0.44–1.00)
GFR, Est AFR Am: >60 mL/min (ref >60)
GFR, Estimated: >60 mL/min (ref >60)
Glucose, Bld: 111 mg/dL — ABNORMAL HIGH (ref 70–99)
Potassium: 4 mmol/L (ref 3.5–5.1)
Sodium: 142 mmol/L (ref 135–145)
Total Bilirubin: 0.5 mg/dL (ref 0.3–1.2)
Total Protein: 6 g/dL — ABNORMAL LOW (ref 6.5–8.1)

## 2019-02-03 LAB — CBC WITH DIFFERENTIAL (CANCER CENTER ONLY)
Abs Immature Granulocytes: 0.01 10*3/uL (ref 0.00–0.07)
Basophils Absolute: 0 10*3/uL (ref 0.0–0.1)
Basophils Relative: 0 %
Eosinophils Absolute: 0 10*3/uL (ref 0.0–0.5)
Eosinophils Relative: 0 %
HCT: 21.5 % — ABNORMAL LOW (ref 36.0–46.0)
Hemoglobin: 7.2 g/dL — ABNORMAL LOW (ref 12.0–15.0)
Immature Granulocytes: 0 %
Lymphocytes Relative: 77 %
Lymphs Abs: 3.9 10*3/uL (ref 0.7–4.0)
MCH: 28.7 pg (ref 26.0–34.0)
MCHC: 33.5 g/dL (ref 30.0–36.0)
MCV: 85.7 fL (ref 80.0–100.0)
Monocytes Absolute: 0.1 10*3/uL (ref 0.1–1.0)
Monocytes Relative: 2 %
Neutro Abs: 1.1 10*3/uL — ABNORMAL LOW (ref 1.7–7.7)
Neutrophils Relative %: 21 %
Platelet Count: 12 10*3/uL — ABNORMAL LOW (ref 150–400)
RBC: 2.51 MIL/uL — ABNORMAL LOW (ref 3.87–5.11)
RDW: 12.1 % (ref 11.5–15.5)
WBC Count: 5.1 10*3/uL (ref 4.0–10.5)
nRBC: 0 % (ref 0.0–0.2)

## 2019-02-03 LAB — SAVE SMEAR(SSMR), FOR PROVIDER SLIDE REVIEW

## 2019-02-03 LAB — RETICULOCYTES
RBC.: 2.53 MIL/uL — ABNORMAL LOW (ref 3.87–5.11)
Retic Ct Pct: <0.4 % — ABNORMAL LOW (ref 0.4–3.1)

## 2019-02-03 LAB — SAMPLE TO BLOOD BANK

## 2019-02-03 LAB — PREPARE RBC (CROSSMATCH)

## 2019-02-03 MED ORDER — SODIUM CHLORIDE 0.9% IV SOLUTION
250.0000 mL | Freq: Once | INTRAVENOUS | Status: AC
  Administered 2019-02-03: 11:00:00 250 mL via INTRAVENOUS
  Filled 2019-02-03: qty 250

## 2019-02-03 MED ORDER — SODIUM CHLORIDE 0.9% FLUSH
10.0000 mL | INTRAVENOUS | Status: DC | PRN
  Administered 2019-02-03: 10 mL
  Filled 2019-02-03: qty 10

## 2019-02-03 MED ORDER — ELTROMBOPAG OLAMINE 75 MG PO TABS: 75.0000 mg | ORAL_TABLET | Freq: Every day | ORAL | 0 refills | Status: DC

## 2019-02-03 MED ORDER — SODIUM CHLORIDE 0.9% FLUSH
10.0000 mL | INTRAVENOUS | Status: AC | PRN
  Administered 2019-02-03: 10 mL
  Filled 2019-02-03: qty 10

## 2019-02-03 MED ORDER — HEPARIN SOD (PORK) LOCK FLUSH 100 UNIT/ML IV SOLN
500.0000 [IU] | Freq: Once | INTRAVENOUS | Status: AC | PRN
  Administered 2019-02-03: 500 [IU]
  Filled 2019-02-03: qty 5

## 2019-02-03 MED ORDER — HEPARIN SOD (PORK) LOCK FLUSH 100 UNIT/ML IV SOLN
250.0000 [IU] | INTRAVENOUS | Status: AC | PRN
  Administered 2019-02-03: 250 [IU]
  Filled 2019-02-03: qty 5

## 2019-02-03 NOTE — Progress Notes (Signed)
[place] OFFICE PROGRESS NOTE   Diagnosis: CLL, pancytopenia  INTERVAL HISTORY:   Meagan Walsh returns as scheduled.  She returned from a [place]long vacation to the beach on 02/01/2019.  She reports feeling well while on vacation.  She has noted mild exertional dyspnea over the past week.  No fever.  She developed swelling at the dorsum of the right foot while on vacation.  This has improved.  She has small bruises, no other bleeding.  No problem with the PICC. Meagan Walsh can no longer feel the right axillary lymph node.  Objective:  Vital signs in last 24 hours:  Blood pressure 116/70, pulse 90, temperature 98.7 F (37.1 C), temperature source Oral, resp. rate 18, height '5\' 6"'  (1.676 m), weight 137 lb 12.8 oz (62.5 kg), SpO2 100 %.    HEENT: Few petechiae at the buccal mucosa, no thrush Lymphatics: No cervical, supraclavicular, or right axillary nodes GI: No hepatosplenomegaly Vascular: No leg edema  Skin: Small ecchymoses at the lower legs bilaterally, resolving ecchymosis at the dorsum of the distal right foot, 2 cm soft mobile cutaneous lesion at the dorsum of the right foot  Portacath/PICC-without erythema  Lab Results:  Lab Results  Component Value Date   WBC 5.1 02/03/2019   HGB 7.2 (L) 02/03/2019   HCT 21.5 (L) 02/03/2019   MCV 85.7 02/03/2019   PLT 12 (L) 02/03/2019   NEUTROABS 1.1 (L) 02/03/2019    CMP  Lab Results  Component Value Date   NA 142 02/03/2019   K 4.0 02/03/2019   CL 107 02/03/2019   CO2 27 02/03/2019   GLUCOSE 111 (H) 02/03/2019   BUN 18 02/03/2019   CREATININE 0.70 02/03/2019   CALCIUM 9.4 02/03/2019   PROT 6.0 (L) 02/03/2019   ALBUMIN 4.0 02/03/2019   AST 19 02/03/2019   ALT 29 02/03/2019   ALKPHOS 68 02/03/2019   BILITOT 0.5 02/03/2019   GFRNONAA >60 02/03/2019   GFRAA >60 02/03/2019     Medications: I have reviewed the patient's current medications.   Assessment/Plan: 1.CLL-diagnosed in August 2010, flow  cytometry consistent with CLL Enlarged leftinguinal lymph node January 2019,smallneck/axillary nodes and palpable splenomegaly 09/11/2017  CTson 09/17/2017-3 cm necrotic appearing lymph node in the left inguinal region, borderline enlarged pelvic/retroperitoneal, chest, and axillary nodes. Mild splenomegaly.  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, no evidence of 11q22deletion  Bone marrow biopsy 02/26/2018-hypercellular marrow with extensive involvement by CLL, lymphocytes represent85% of all cells  Ibrutinib initiated 04/03/2018  Ibrutinib placed on hold 04/11/2018 due to onset of arthralgias  Ibrutinib resumed 04/16/2018, discontinued 04/25/2018 secondary to severe arthralgias/arthritis  Ibrutinib resumed at a dose of 140 mg daily 05/03/2018  Ibrutinib dose adjusted to 140 mg alternating with 231m 06/25/2018  Ibrutinib discontinued 07/03/2018 secondary to severe arthralgias  Acalabrutinib 08/16/2018, discontinued 11/15/2018 secondary to persistent severe transfusion dependent anemia and neutropenia/thrombocytopenia, last dose 11/14/2018  Bone marrow biopsy 11/21/2018-decreased cellularity, involvement by CLL, decreased erythroid and granulocytic precursors, decreased megakaryocytes  Cycle 1 rituximab 12/06/2018  Bone marrow biopsy 12/24/2018 at [facility]-hypocellular bone marrow (10%) involved by  CLL, representing 50% of marrow cellularity;  markedly decreased trilineage hematopoiesis including essentially absent erythropoiesis  2.Hypothyroidism 3.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 Augmentin prescribed. 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 Pancytopenia secondary to CLL and a hypocellular bone marrow  G-CSF and Nplate started 4/88/8916, G-CSF changed to daily beginning 12/17/2018; G-CSF discontinued 12/31/2018, last Nplate 01/15/2019   Began prednisone 60 mg daily 01/11/19, tolerating moderately well except jitteriness, irritability, and difficulty sleeping. Tapered to 40 mg daily x1 week starting 01/24/19, reduce by 10 mg each week until discontinued   Promacta started 01/20/2019    Disposition: Meagan Walsh appears well.  She has symptoms related to anemia today.  The pancytopenia appears to be slowly improving.  She was transfused with 1 unit of packed red blood cells on 01/24/2019.  She last received platelets on 01/17/2019.  The neutrophil count appears adequate.  She is now maintained off of Levaquin and Diflucan.  We will hold on platelet transfusions for a count of greater than 10,000.  She will receive 2 units of packed red blood cells today.  Prednisone was tapered to 30 mg daily beginning 01/30/2019.  The plan is to taper the prednisone to 20 mg daily beginning 02/06/2019.  Meagan Walsh will return for a PICC flush and CBC on 02/07/2019 and 02/14/2019.  She will be scheduled for an office visit on 02/21/2019.  I will contacted Dr. Prince Solian to review her case today.  Meagan Walsh husband was present by telephone for the visit  today.  Meagan Coder, MD  02/03/2019  1:40 PM

## 2019-02-03 NOTE — Telephone Encounter (Signed)
Oral Oncology Pharmacist Encounter  Patient started Promacta 50mg  once daily on 01/17/2019. Platelet count and Hgb remain sub-optimal 2 weeks after initiation and dose will be increased. Labs from today assessed, OK for dose increase.  Prescription for Promacta 75mg  tablets has been e-scribed to the [facility] for quantity #30, refills=0.  Promacta dose can be further escalated in 25 mg - 50 mg increments, up to 150 mg/d, in 2 weeks intervals to achieve desired response. Promacta should be discontinued if hematologic response does not occur within 16 weeks of initiation.  We will plan to reach out to patient on 02/04/19 to coordinate acquisition of new dose.  Johny Drilling, PharmD, BCPS, BCOP  02/03/2019 3:15 PM Oral Oncology Clinic [phone]

## 2019-02-03 NOTE — Patient Instructions (Signed)
Blood Transfusion, Adult, Care After This sheet gives you information about how to care for yourself after your procedure. Your doctor may also give you more specific instructions. If you have problems or questions, contact your doctor. Follow these instructions at home:   Take over-the-counter and prescription medicines only as told by your doctor.  Go back to your normal activities as told by your doctor.  Follow instructions from your doctor about how to take care of the area where an IV tube was put into your vein (insertion site). Make sure you: ? Wash your hands with soap and water before you change your bandage (dressing). If there is no soap and water, use hand sanitizer. ? Change your bandage as told by your doctor.  Check your IV insertion site every day for signs of infection. Check for: ? More redness, swelling, or pain. ? More fluid or blood. ? Warmth. ? Pus or a bad smell. Contact a doctor if:  You have more redness, swelling, or pain around the IV insertion site.  You have more fluid or blood coming from the IV insertion site.  Your IV insertion site feels warm to the touch.  You have pus or a bad smell coming from the IV insertion site.  Your pee (urine) turns pink, red, or brown.  You feel weak after doing your normal activities. Get help right away if:  You have signs of a serious allergic or body defense (immune) system reaction, including: ? Itchiness. ? Hives. ? Trouble breathing. ? Anxiety. ? Pain in your chest or lower back. ? Fever, flushing, and chills. ? Fast pulse. ? Rash. ? Watery poop (diarrhea). ? Throwing up (vomiting). ? Dark pee. ? Serious headache. ? Dizziness. ? Stiff neck. ? Yellow color in your face or the white parts of your eyes (jaundice). Summary  After a blood transfusion, return to your normal activities as told by your doctor.  Every day, check for signs of infection where the IV tube was put into your vein.  Some  signs of infection are warm skin, more redness and pain, more fluid or blood, and pus or a bad smell where the needle went in.  Contact your doctor if you feel weak or have any unusual symptoms. This information is not intended to replace advice given to you by your health care provider. Make sure you discuss any questions you have with your health care provider. Document Released: 07/31/2014 Document Revised: 11/14/2017 Document Reviewed: 03/03/2016 Elsevier Patient Education  2020 Elsevier Inc.  Coronavirus (COVID-19) Are you at risk?  Are you at risk for the Coronavirus (COVID-19)?  To be considered HIGH RISK for Coronavirus (COVID-19), you have to meet the following criteria:  . Traveled to [place], [place], [place], [place] or [place]; or in the [place] to [place], [place], [place], or [place]; and have fever, cough, and shortness of breath within the last 2 weeks of travel OR . Been in close contact with a person diagnosed with COVID-19 within the last 2 weeks and have fever, cough, and shortness of breath . IF YOU DO NOT MEET THESE CRITERIA, YOU ARE CONSIDERED LOW RISK FOR COVID-19.  What to do if you are HIGH RISK for COVID-19?  . If you are having a medical emergency, call 911. . Seek medical care right away. Before you go to a doctor's office, urgent care or emergency department, call ahead and tell them about your recent travel, contact with someone diagnosed with COVID-19, and   your symptoms. You should receive instructions from your physician's office regarding next steps of care.  . When you arrive at healthcare provider, tell the healthcare staff immediately you have returned from visiting [place], [place], [place], [place] or [place]; or traveled in the [place] to [place], [place], [place], or [place]; in the last two weeks or you have been in close contact with a person diagnosed with COVID-19 in the last 2 weeks.   . Tell the health care staff about  your symptoms: fever, cough and shortness of breath. . After you have been seen by a medical provider, you will be either: o Tested for (COVID-19) and discharged home on quarantine except to seek medical care if symptoms worsen, and asked to  - Stay home and avoid contact with others until you get your results (4-5 days)  - Avoid travel on public transportation if possible (such as bus, train, or airplane) or o Sent to the Emergency Department by EMS for evaluation, COVID-19 testing, and possible admission depending on your condition and test results.  What to do if you are LOW RISK for COVID-19?  Reduce your risk of any infection by using the same precautions used for avoiding the common cold or flu:  . Wash your hands often with soap and warm water for at least 20 seconds.  If soap and water are not readily available, use an alcohol-based hand sanitizer with at least 60% alcohol.  . If coughing or sneezing, cover your mouth and nose by coughing or sneezing into the elbow areas of your shirt or coat, into a tissue or into your sleeve (not your hands). . Avoid shaking hands with others and consider head nods or verbal greetings only. . Avoid touching your eyes, nose, or mouth with unwashed hands.  . Avoid close contact with people who are sick. . Avoid places or events with large numbers of people in one location, like concerts or sporting events. . Carefully consider travel plans you have or are making. . If you are planning any travel outside or inside the [place], visit the CDC's Travelers' Health webpage for the latest health notices. . If you have some symptoms but not all symptoms, continue to monitor at home and seek medical attention if your symptoms worsen. . If you are having a medical emergency, call 911.   ADDITIONAL HEALTHCARE OPTIONS FOR PATIENTS  [facility] / e-Visit: [email or web address]         [facility]:  [phone]  [facility]: [phone]                   [facility]: [phone]   

## 2019-02-04 ENCOUNTER — Telehealth: Payer: Self-pay | Admitting: Oncology

## 2019-02-04 LAB — TYPE AND SCREEN
ABO/RH(D): O POS
Antibody Screen: NEGATIVE
Unit division: 0
Unit division: 0

## 2019-02-04 LAB — BPAM RBC
Blood Product Expiration Date: 202008052359
Blood Product Expiration Date: 202008102359
ISSUE DATE / TIME: 202007131128
ISSUE DATE / TIME: 202007131128
Unit Type and Rh: 5100
Unit Type and Rh: 5100

## 2019-02-04 MED ORDER — ELTROMBOPAG OLAMINE 50 MG PO TABS: 100.0000 mg | ORAL_TABLET | Freq: Every day | ORAL | 0 refills | Status: DC

## 2019-02-04 NOTE — Telephone Encounter (Signed)
Oral Oncology Pharmacist Encounter  Discussed dose increase with Dr. Benay Spice, after he further discussed dose increase with Dr. Durene Romans.  Promacta dose will increase to 100 mg once daily, using the 50mg  tablets. New prescription for the 50mg  tablets to take 2 tablets daily (100 mg total daily dose) has been sent to the [facility], quantity #60, refills=0.  Oral oncology patient advocate has left voicemail for patient to discuss medication acquisition. I will plan to discuss dose change with patient we are able to connect to discuss new dose acquisition.  Johny Drilling, PharmD, BCPS, BCOP  02/04/2019 9:29 AM [facility] [phone]

## 2019-02-04 NOTE — Telephone Encounter (Signed)
Called and left msg about upcoming appts  °

## 2019-02-06 NOTE — Telephone Encounter (Signed)
Oral Oncology Patient Advocate Encounter  I spoke to the patient this morning. She has a weeks worth of medicine right now to make the 100mg  dose.   She had called the pharmacy this morning before I called her and they have a refill call scheduled for her on Monday 7/20. She would like to wait until that call to arrange the promacta to be sent to her, she mentioned labs and an office visit and felt comfortable with waiting until Monday to schedule the new Promacta dose to be filled and shipped to her.  The patient knows to call the office with questions or concerns.  [facility] [place] Phone [phone] Fax [phone] 02/06/2019   9:22 AM

## 2019-02-07 ENCOUNTER — Inpatient Hospital Stay: Payer: BC Managed Care – PPO

## 2019-02-07 ENCOUNTER — Encounter: Payer: Self-pay | Admitting: *Deleted

## 2019-02-07 ENCOUNTER — Other Ambulatory Visit: Payer: Self-pay

## 2019-02-07 DIAGNOSIS — I341 Nonrheumatic mitral (valve) prolapse: Secondary | ICD-10-CM | POA: Diagnosis not present

## 2019-02-07 DIAGNOSIS — D696 Thrombocytopenia, unspecified: Secondary | ICD-10-CM

## 2019-02-07 DIAGNOSIS — M542 Cervicalgia: Secondary | ICD-10-CM | POA: Diagnosis not present

## 2019-02-07 DIAGNOSIS — D649 Anemia, unspecified: Secondary | ICD-10-CM

## 2019-02-07 DIAGNOSIS — R0602 Shortness of breath: Secondary | ICD-10-CM | POA: Diagnosis not present

## 2019-02-07 DIAGNOSIS — R161 Splenomegaly, not elsewhere classified: Secondary | ICD-10-CM | POA: Diagnosis not present

## 2019-02-07 DIAGNOSIS — F329 Major depressive disorder, single episode, unspecified: Secondary | ICD-10-CM | POA: Diagnosis not present

## 2019-02-07 DIAGNOSIS — C9110 Chronic lymphocytic leukemia of B-cell type not having achieved remission: Secondary | ICD-10-CM | POA: Diagnosis not present

## 2019-02-07 DIAGNOSIS — B1910 Unspecified viral hepatitis B without hepatic coma: Secondary | ICD-10-CM | POA: Diagnosis not present

## 2019-02-07 DIAGNOSIS — Z95828 Presence of other vascular implants and grafts: Secondary | ICD-10-CM

## 2019-02-07 DIAGNOSIS — Z79899 Other long term (current) drug therapy: Secondary | ICD-10-CM | POA: Diagnosis not present

## 2019-02-07 DIAGNOSIS — D801 Nonfamilial hypogammaglobulinemia: Secondary | ICD-10-CM | POA: Diagnosis not present

## 2019-02-07 DIAGNOSIS — E039 Hypothyroidism, unspecified: Secondary | ICD-10-CM | POA: Diagnosis not present

## 2019-02-07 DIAGNOSIS — M7989 Other specified soft tissue disorders: Secondary | ICD-10-CM | POA: Diagnosis not present

## 2019-02-07 DIAGNOSIS — D61818 Other pancytopenia: Secondary | ICD-10-CM | POA: Diagnosis not present

## 2019-02-07 DIAGNOSIS — Z85820 Personal history of malignant melanoma of skin: Secondary | ICD-10-CM | POA: Diagnosis not present

## 2019-02-07 LAB — CBC WITH DIFFERENTIAL (CANCER CENTER ONLY)
Abs Immature Granulocytes: 0 10*3/uL (ref 0.00–0.07)
Basophils Absolute: 0 10*3/uL (ref 0.0–0.1)
Basophils Relative: 0 %
Eosinophils Absolute: 0 10*3/uL (ref 0.0–0.5)
Eosinophils Relative: 0 %
HCT: 31.1 % — ABNORMAL LOW (ref 36.0–46.0)
Hemoglobin: 10.4 g/dL — ABNORMAL LOW (ref 12.0–15.0)
Immature Granulocytes: 0 %
Lymphocytes Relative: 75 %
Lymphs Abs: 4.3 10*3/uL — ABNORMAL HIGH (ref 0.7–4.0)
MCH: 29 pg (ref 26.0–34.0)
MCHC: 33.4 g/dL (ref 30.0–36.0)
MCV: 86.6 fL (ref 80.0–100.0)
Monocytes Absolute: 0.1 10*3/uL (ref 0.1–1.0)
Monocytes Relative: 2 %
Neutro Abs: 1.3 10*3/uL — ABNORMAL LOW (ref 1.7–7.7)
Neutrophils Relative %: 23 %
Platelet Count: 11 10*3/uL — ABNORMAL LOW (ref 150–400)
RBC: 3.59 MIL/uL — ABNORMAL LOW (ref 3.87–5.11)
RDW: 12.7 % (ref 11.5–15.5)
WBC Count: 5.8 10*3/uL (ref 4.0–10.5)
nRBC: 0 % (ref 0.0–0.2)

## 2019-02-07 LAB — SAMPLE TO BLOOD BANK

## 2019-02-07 MED ORDER — HEPARIN SOD (PORK) LOCK FLUSH 100 UNIT/ML IV SOLN
500.0000 [IU] | Freq: Once | INTRAVENOUS | Status: AC | PRN
  Administered 2019-02-07: 250 [IU]
  Filled 2019-02-07: qty 5

## 2019-02-07 MED ORDER — SODIUM CHLORIDE 0.9% FLUSH
10.0000 mL | INTRAVENOUS | Status: DC | PRN
  Administered 2019-02-07: 10 mL
  Filled 2019-02-07: qty 10

## 2019-02-07 NOTE — Progress Notes (Signed)
MD reviewed CBC/diff results. Continue same meds/doses and f/u as scheduled. Patient notified and denies any s/s infection or bleeding.

## 2019-02-11 MED FILL — PROMACTA 50 MG TABLET: 50 | 30 days supply | Qty: 60 | Fill #0

## 2019-02-14 ENCOUNTER — Other Ambulatory Visit: Payer: Self-pay | Admitting: Oncology

## 2019-02-14 ENCOUNTER — Inpatient Hospital Stay: Payer: BC Managed Care – PPO

## 2019-02-14 ENCOUNTER — Other Ambulatory Visit: Payer: Self-pay | Admitting: *Deleted

## 2019-02-14 ENCOUNTER — Other Ambulatory Visit: Payer: Self-pay

## 2019-02-14 DIAGNOSIS — M7989 Other specified soft tissue disorders: Secondary | ICD-10-CM | POA: Diagnosis not present

## 2019-02-14 DIAGNOSIS — D696 Thrombocytopenia, unspecified: Secondary | ICD-10-CM

## 2019-02-14 DIAGNOSIS — M542 Cervicalgia: Secondary | ICD-10-CM | POA: Diagnosis not present

## 2019-02-14 DIAGNOSIS — D649 Anemia, unspecified: Secondary | ICD-10-CM

## 2019-02-14 DIAGNOSIS — D61818 Other pancytopenia: Secondary | ICD-10-CM | POA: Diagnosis not present

## 2019-02-14 DIAGNOSIS — I341 Nonrheumatic mitral (valve) prolapse: Secondary | ICD-10-CM | POA: Diagnosis not present

## 2019-02-14 DIAGNOSIS — C9110 Chronic lymphocytic leukemia of B-cell type not having achieved remission: Secondary | ICD-10-CM

## 2019-02-14 DIAGNOSIS — F329 Major depressive disorder, single episode, unspecified: Secondary | ICD-10-CM | POA: Diagnosis not present

## 2019-02-14 DIAGNOSIS — Z79899 Other long term (current) drug therapy: Secondary | ICD-10-CM | POA: Diagnosis not present

## 2019-02-14 DIAGNOSIS — Z85820 Personal history of malignant melanoma of skin: Secondary | ICD-10-CM | POA: Diagnosis not present

## 2019-02-14 DIAGNOSIS — R161 Splenomegaly, not elsewhere classified: Secondary | ICD-10-CM | POA: Diagnosis not present

## 2019-02-14 DIAGNOSIS — E039 Hypothyroidism, unspecified: Secondary | ICD-10-CM | POA: Diagnosis not present

## 2019-02-14 DIAGNOSIS — B1910 Unspecified viral hepatitis B without hepatic coma: Secondary | ICD-10-CM | POA: Diagnosis not present

## 2019-02-14 DIAGNOSIS — Z95828 Presence of other vascular implants and grafts: Secondary | ICD-10-CM

## 2019-02-14 DIAGNOSIS — D801 Nonfamilial hypogammaglobulinemia: Secondary | ICD-10-CM | POA: Diagnosis not present

## 2019-02-14 DIAGNOSIS — R0602 Shortness of breath: Secondary | ICD-10-CM | POA: Diagnosis not present

## 2019-02-14 LAB — CBC WITH DIFFERENTIAL (CANCER CENTER ONLY)
Abs Immature Granulocytes: 0 10*3/uL (ref 0.00–0.07)
Basophils Absolute: 0 10*3/uL (ref 0.0–0.1)
Basophils Relative: 0 %
Eosinophils Absolute: 0 10*3/uL (ref 0.0–0.5)
Eosinophils Relative: 0 %
HCT: 24.4 % — ABNORMAL LOW (ref 36.0–46.0)
Hemoglobin: 8.1 g/dL — ABNORMAL LOW (ref 12.0–15.0)
Immature Granulocytes: 0 %
Lymphocytes Relative: 70 %
Lymphs Abs: 2.9 10*3/uL (ref 0.7–4.0)
MCH: 29.1 pg (ref 26.0–34.0)
MCHC: 33.2 g/dL (ref 30.0–36.0)
MCV: 87.8 fL (ref 80.0–100.0)
Monocytes Absolute: 0.1 10*3/uL (ref 0.1–1.0)
Monocytes Relative: 2 %
Neutro Abs: 1.1 10*3/uL — ABNORMAL LOW (ref 1.7–7.7)
Neutrophils Relative %: 28 %
Platelet Count: 10 10*3/uL — ABNORMAL LOW (ref 150–400)
RBC: 2.78 MIL/uL — ABNORMAL LOW (ref 3.87–5.11)
RDW: 12.5 % (ref 11.5–15.5)
WBC Count: 4.1 10*3/uL (ref 4.0–10.5)
nRBC: 0 % (ref 0.0–0.2)

## 2019-02-14 LAB — CMP (CANCER CENTER ONLY)
ALT: 21 U/L (ref 0–44)
AST: 16 U/L (ref 15–41)
Albumin: 4 g/dL (ref 3.5–5.0)
Alkaline Phosphatase: 78 U/L (ref 38–126)
Anion gap: 8 (ref 5–15)
BUN: 24 mg/dL — ABNORMAL HIGH (ref 6–20)
CO2: 26 mmol/L (ref 22–32)
Calcium: 9.4 mg/dL (ref 8.9–10.3)
Chloride: 108 mmol/L (ref 98–111)
Creatinine: 0.64 mg/dL (ref 0.44–1.00)
GFR, Est AFR Am: >60 mL/min (ref >60)
GFR, Estimated: >60 mL/min (ref >60)
Glucose, Bld: 133 mg/dL — ABNORMAL HIGH (ref 70–99)
Potassium: 4 mmol/L (ref 3.5–5.1)
Sodium: 142 mmol/L (ref 135–145)
Total Bilirubin: 0.6 mg/dL (ref 0.3–1.2)
Total Protein: 5.9 g/dL — ABNORMAL LOW (ref 6.5–8.1)

## 2019-02-14 LAB — PREPARE RBC (CROSSMATCH)

## 2019-02-14 LAB — SAMPLE TO BLOOD BANK

## 2019-02-14 MED ORDER — SODIUM CHLORIDE 0.9% FLUSH
10.0000 mL | INTRAVENOUS | Status: DC | PRN
  Administered 2019-02-14: 10 mL
  Filled 2019-02-14: qty 10

## 2019-02-14 MED ORDER — HEPARIN SOD (PORK) LOCK FLUSH 100 UNIT/ML IV SOLN
500.0000 [IU] | Freq: Once | INTRAVENOUS | Status: AC | PRN
  Administered 2019-02-14: 250 [IU]
  Filled 2019-02-14: qty 5

## 2019-02-14 MED ORDER — SODIUM CHLORIDE 0.9% IV SOLUTION
250.0000 mL | Freq: Once | INTRAVENOUS | Status: AC
  Administered 2019-02-14: 250 mL via INTRAVENOUS
  Filled 2019-02-14: qty 250

## 2019-02-14 MED ORDER — HEPARIN SOD (PORK) LOCK FLUSH 100 UNIT/ML IV SOLN
250.0000 [IU] | INTRAVENOUS | Status: AC | PRN
  Administered 2019-02-14: 250 [IU]
  Filled 2019-02-14: qty 5

## 2019-02-14 MED ORDER — SODIUM CHLORIDE 0.9% FLUSH
10.0000 mL | INTRAVENOUS | Status: AC | PRN
  Administered 2019-02-14: 10 mL
  Filled 2019-02-14: qty 10

## 2019-02-14 NOTE — Progress Notes (Signed)
Per Dr. Benay Spice: Transfuse 1 unit blood today or tomorrow and no platelets. Continue same Prednisone and Promacta. He will call and discuss labs today with Dr. Prince Solian. Keep visit on 02/21/19 as scheduled. Confirmed with blood bank they see orders for today.

## 2019-02-14 NOTE — Patient Instructions (Signed)
PICC Home Care Guide ° °A peripherally inserted central catheter (PICC) is a form of IV access that allows medicines and IV fluids to be quickly distributed throughout the body. The PICC is a long, thin, flexible tube (catheter) that is inserted into a vein in the upper arm. The catheter ends in a large vein in the chest (superior vena cava, or SVC). After the PICC is inserted, a chest X-ray may be done to make sure that it is in the correct place. °A PICC may be placed for different reasons, such as: °· To give medicines and liquid nutrition. °· To give IV fluids and blood products. °· If there is trouble placing a peripheral intravenous (PIV) catheter. °If taken care of properly, a PICC can remain in place for several months. Having a PICC can also allow a person to go home from the hospital sooner. Medicine and PICC care can be managed at home by a family member, caregiver, or home health care team. °What are the risks? °Generally, having a PICC is safe. However, problems may occur, including: °· A blood clot (thrombus) forming in or at the tip of the PICC. °· A blood clot forming in a vein (deep vein thrombosis) or traveling to the lung (pulmonary embolism). °· Inflammation of the vein (phlebitis) in which the PICC is placed. °· Infection. Central line associated blood stream infection (CLABSI) is a serious infection that often requires hospitalization. °· PICC movement (malposition). The PICC tip may move from its original position due to excessive physical activity, forceful coughing, sneezing, or vomiting. °· A break or cut in the PICC. It is important not to use scissors near the PICC. °· Nerve or tendon irritation or injury during PICC insertion. °How to take care of your PICC °Preventing problems °· You and any caregivers should wash your hands often with soap. Wash hands: °? Before touching the PICC line or the infusion device. °? Before changing a bandage (dressing). °· Flush the PICC as told by your  health care provider. Let your health care provider know right away if the PICC is hard to flush or does not flush. Do not use force to flush the PICC. °· Do not use a syringe that is less than 10 mL to flush the PICC. °· Avoid blood pressure checks on the arm in which the PICC is placed. °· Never pull or tug on the PICC. °· Do not take the PICC out yourself. Only a trained clinical professional should remove the PICC. °· Use clean and sterile supplies only. Keep the supplies in a dry place. Do not reuse needles, syringes, or any other supplies. Doing that can lead to infection. °· Keep pets and children away from your PICC line. °· Check the PICC insertion site every day for signs of infection. Check for: °? Leakage. °? Redness, swelling, or pain. °? Fluid or blood. °? Warmth. °? Pus or a bad smell. °PICC dressing care °· Keep your PICC bandage (dressing) clean and dry to prevent infection. °· Do not take baths, swim, or use a hot tub until your health care provider approves. Ask your health care provider if you can take showers. You may only be allowed to take sponge baths for bathing. When you are allowed to shower: °? Ask your health care provider to teach you how to wrap the PICC line. °? Cover the PICC line with clear plastic wrap and tape to keep it dry while showering. °· Follow instructions from your health care provider   about how to take care of your insertion site and dressing. Make sure you: °? Wash your hands with soap and water before you change your bandage (dressing). If soap and water are not available, use hand sanitizer. °? Change your dressing as told by your health care provider. °? Leave stitches (sutures), skin glue, or adhesive strips in place. These skin closures may need to stay in place for 2 weeks or longer. If adhesive strip edges start to loosen and curl up, you may trim the loose edges. Do not remove adhesive strips completely unless your health care provider tells you to do  that. °· Change your PICC dressing if it becomes loose or wet. °General instructions ° °· Carry your PICC identification card or wear a medical alert bracelet at all times. °· Keep the tube clamped at all times, unless it is being used. °· Carry a smooth-edge clamp with you at all times to place on the tube if it breaks. °· Do not use scissors or sharp objects near the tube. °· You may bend your arm and move it freely. If your PICC is near or at the bend of your elbow, avoid activity with repeated motion at the elbow. °· Avoid lifting heavy objects as told by your health care provider. °· Keep all follow-up visits as told by your health care provider. This is important. °Disposal of supplies °· Throw away any syringes in a disposal container that is meant for sharp items (sharps container). You can buy a sharps container from a pharmacy, or you can make one by using an empty hard plastic bottle with a cover. °· Place any used dressings or infusion bags into a plastic bag. Throw that bag in the trash. °Contact a health care provider if: °· You have pain in your arm, ear, face, or teeth. °· You have a fever or chills. °· You have redness, swelling, or pain around the insertion site. °· You have fluid or blood coming from the insertion site. °· Your insertion site feels warm to the touch. °· You have pus or a bad smell coming from the insertion site. °· Your skin feels hard and raised around the insertion site. °Get help right away if: °· Your PICC is accidentally pulled all the way out. If this happens, cover the insertion site with a bandage or gauze dressing. Do not throw the PICC away. Your health care provider will need to check it. °· Your PICC was tugged or pulled and has partially come out. Do not  push the PICC back in. °· You cannot flush the PICC, it is hard to flush, or the PICC leaks around the insertion site when it is flushed. °· You hear a "flushing" sound when the PICC is flushed. °· You feel your  heart racing or skipping beats. °· There is a hole or tear in the PICC. °· You have swelling in the arm in which the PICC was inserted. °· You have a red streak going up your arm from where the PICC was inserted. °Summary °· A peripherally inserted central catheter (PICC) is a long, thin, flexible tube (catheter) that is inserted into a vein in the upper arm. °· The PICC is inserted using a sterile technique by a specially trained nurse or physician. Only a trained clinical professional should remove it. °· Keep your PICC identification card with you at all times. °· Avoid blood pressure checks on the arm in which the PICC is placed. °· If cared for   properly, a PICC can remain in place for several months. Having a PICC can also allow a person to go home from the hospital sooner. °This information is not intended to replace advice given to you by your health care provider. Make sure you discuss any questions you have with your health care provider. °Document Released: 01/14/2003 Document Revised: 06/22/2017 Document Reviewed: 08/12/2016 °Elsevier Patient Education © 2020 Elsevier Inc. ° °

## 2019-02-15 LAB — BPAM RBC
Blood Product Expiration Date: 202008202359
ISSUE DATE / TIME: 202007241145
Unit Type and Rh: 5100

## 2019-02-15 LAB — TYPE AND SCREEN
ABO/RH(D): O POS
Antibody Screen: NEGATIVE
Unit division: 0

## 2019-02-21 ENCOUNTER — Inpatient Hospital Stay: Payer: BC Managed Care – PPO

## 2019-02-21 ENCOUNTER — Telehealth: Payer: Self-pay | Admitting: Nurse Practitioner

## 2019-02-21 ENCOUNTER — Inpatient Hospital Stay (HOSPITAL_BASED_OUTPATIENT_CLINIC_OR_DEPARTMENT_OTHER): Payer: BC Managed Care – PPO | Admitting: Nurse Practitioner

## 2019-02-21 ENCOUNTER — Telehealth: Payer: Self-pay | Admitting: *Deleted

## 2019-02-21 ENCOUNTER — Other Ambulatory Visit: Payer: Self-pay

## 2019-02-21 ENCOUNTER — Encounter: Payer: Self-pay | Admitting: Nurse Practitioner

## 2019-02-21 ENCOUNTER — Other Ambulatory Visit: Payer: Self-pay | Admitting: *Deleted

## 2019-02-21 VITALS — BP 120/70 | HR 89 | Temp 98.5°F | Resp 18 | Ht 66.0 in | Wt 138.9 lb

## 2019-02-21 DIAGNOSIS — C9110 Chronic lymphocytic leukemia of B-cell type not having achieved remission: Secondary | ICD-10-CM

## 2019-02-21 DIAGNOSIS — D801 Nonfamilial hypogammaglobulinemia: Secondary | ICD-10-CM | POA: Diagnosis not present

## 2019-02-21 DIAGNOSIS — E039 Hypothyroidism, unspecified: Secondary | ICD-10-CM | POA: Diagnosis not present

## 2019-02-21 DIAGNOSIS — M7989 Other specified soft tissue disorders: Secondary | ICD-10-CM

## 2019-02-21 DIAGNOSIS — R161 Splenomegaly, not elsewhere classified: Secondary | ICD-10-CM

## 2019-02-21 DIAGNOSIS — M542 Cervicalgia: Secondary | ICD-10-CM

## 2019-02-21 DIAGNOSIS — D696 Thrombocytopenia, unspecified: Secondary | ICD-10-CM

## 2019-02-21 DIAGNOSIS — D61818 Other pancytopenia: Secondary | ICD-10-CM

## 2019-02-21 DIAGNOSIS — Z79899 Other long term (current) drug therapy: Secondary | ICD-10-CM | POA: Diagnosis not present

## 2019-02-21 DIAGNOSIS — F329 Major depressive disorder, single episode, unspecified: Secondary | ICD-10-CM

## 2019-02-21 DIAGNOSIS — R0602 Shortness of breath: Secondary | ICD-10-CM | POA: Diagnosis not present

## 2019-02-21 DIAGNOSIS — D649 Anemia, unspecified: Secondary | ICD-10-CM | POA: Diagnosis not present

## 2019-02-21 DIAGNOSIS — B1910 Unspecified viral hepatitis B without hepatic coma: Secondary | ICD-10-CM | POA: Diagnosis not present

## 2019-02-21 DIAGNOSIS — Z85820 Personal history of malignant melanoma of skin: Secondary | ICD-10-CM

## 2019-02-21 DIAGNOSIS — I341 Nonrheumatic mitral (valve) prolapse: Secondary | ICD-10-CM

## 2019-02-21 DIAGNOSIS — Z95828 Presence of other vascular implants and grafts: Secondary | ICD-10-CM

## 2019-02-21 LAB — CBC WITH DIFFERENTIAL (CANCER CENTER ONLY)
Abs Immature Granulocytes: 0 10*3/uL (ref 0.00–0.07)
Basophils Absolute: 0 10*3/uL (ref 0.0–0.1)
Basophils Relative: 0 %
Eosinophils Absolute: 0 10*3/uL (ref 0.0–0.5)
Eosinophils Relative: 0 %
HCT: 24.7 % — ABNORMAL LOW (ref 36.0–46.0)
Hemoglobin: 8.5 g/dL — ABNORMAL LOW (ref 12.0–15.0)
Immature Granulocytes: 0 %
Lymphocytes Relative: 73 %
Lymphs Abs: 2.2 10*3/uL (ref 0.7–4.0)
MCH: 29.8 pg (ref 26.0–34.0)
MCHC: 34.4 g/dL (ref 30.0–36.0)
MCV: 86.7 fL (ref 80.0–100.0)
Monocytes Absolute: 0.1 10*3/uL (ref 0.1–1.0)
Monocytes Relative: 3 %
Neutro Abs: 0.7 10*3/uL — ABNORMAL LOW (ref 1.7–7.7)
Neutrophils Relative %: 24 %
Platelet Count: 5 10*3/uL — CL (ref 150–400)
RBC: 2.85 MIL/uL — ABNORMAL LOW (ref 3.87–5.11)
RDW: 12.5 % (ref 11.5–15.5)
WBC Count: 3 10*3/uL — ABNORMAL LOW (ref 4.0–10.5)
nRBC: 0 % (ref 0.0–0.2)

## 2019-02-21 LAB — PLATELET COUNT (CANCER CENTER ONLY): Platelet Count: 28 10*3/uL — ABNORMAL LOW (ref 150–400)

## 2019-02-21 LAB — PREPARE RBC (CROSSMATCH)

## 2019-02-21 MED ORDER — SODIUM CHLORIDE 0.9% FLUSH
10.0000 mL | INTRAVENOUS | Status: DC | PRN
  Administered 2019-02-21: 10 mL
  Filled 2019-02-21: qty 10

## 2019-02-21 MED ORDER — SODIUM CHLORIDE 0.9% FLUSH
10.0000 mL | INTRAVENOUS | Status: AC | PRN
  Administered 2019-02-21: 10 mL
  Filled 2019-02-21: qty 10

## 2019-02-21 MED ORDER — HEPARIN SOD (PORK) LOCK FLUSH 100 UNIT/ML IV SOLN
500.0000 [IU] | Freq: Once | INTRAVENOUS | Status: AC | PRN
  Administered 2019-02-21: 500 [IU]
  Filled 2019-02-21: qty 5

## 2019-02-21 MED ORDER — HEPARIN SOD (PORK) LOCK FLUSH 100 UNIT/ML IV SOLN
250.0000 [IU] | INTRAVENOUS | Status: AC | PRN
  Administered 2019-02-21: 250 [IU]
  Filled 2019-02-21: qty 5

## 2019-02-21 MED ORDER — SODIUM CHLORIDE 0.9% IV SOLUTION
250.0000 mL | Freq: Once | INTRAVENOUS | Status: AC
  Administered 2019-02-21: 250 mL via INTRAVENOUS
  Filled 2019-02-21: qty 250

## 2019-02-21 NOTE — Telephone Encounter (Signed)
Scheduled per los  °

## 2019-02-21 NOTE — Patient Instructions (Signed)
Immune Globulin Injection What is this medicine? IMMUNE GLOBULIN (im MUNE GLOB yoo lin) helps to prevent or reduce the severity of certain infections in patients who are at risk. This medicine is collected from the pooled blood of many donors. It is used to treat immune system problems, thrombocytopenia, and Kawasaki syndrome. This medicine may be used for other purposes; ask your health care provider or pharmacist if you have questions. COMMON BRAND NAME(S): ASCENIV, Baygam, BIVIGAM, Carimune, Carimune NF, cutaquig, Cuvitru, Flebogamma, Flebogamma DIF, GamaSTAN, GamaSTAN S/D, Gamimune N, Gammagard, Gammagard S/D, Gammaked, Gammaplex, Gammar-P IV, Gamunex, Gamunex-C, Hizentra, Iveegam, Iveegam EN, Octagam, Panglobulin, Panglobulin NF, panzyga, Polygam S/D, Privigen, Sandoglobulin, Venoglobulin-S, Vigam, Vivaglobulin, Xembify What should I tell my health care provider before I take this medicine? They need to know if you have any of these conditions:   diabetes   extremely low or no immune antibodies in the blood   heart disease   history of blood clots   hyperprolinemia   infection in the blood, sepsis   kidney disease   taking medicine that may change kidney function - ask your health care provider about your medicine   an unusual or allergic reaction to human immune globulin, albumin, maltose, sucrose, polysorbate 80, other medicines, foods, dyes, or preservatives   pregnant or trying to get pregnant   breast-feeding How should I use this medicine? This medicine is for injection into a muscle or infusion into a vein or skin. It is usually given by a health care professional in a hospital or clinic setting. In rare cases, some brands of this medicine might be given at home. You will be taught how to give this medicine. Use exactly as directed. Take your medicine at regular intervals. Do not take your medicine more often than directed. Talk to your pediatrician regarding  the use of this medicine in children. Special care may be needed. Overdosage: If you think you have taken too much of this medicine contact a poison control center or emergency room at once. NOTE: This medicine is only for you. Do not share this medicine with others. What if I miss a dose? It is important not to miss your dose. Call your doctor or health care professional if you are unable to keep an appointment. If you give yourself the medicine and you miss a dose, take it as soon as you can. If it is almost time for your next dose, take only that dose. Do not take double or extra doses. What may interact with this medicine?  aspirin and aspirin-like medicines  cisplatin  cyclosporine  medicines for infection like acyclovir, adefovir, amphotericin B, bacitracin, cidofovir, foscarnet, ganciclovir, gentamicin, pentamidine, vancomycin  NSAIDS, medicines for pain and inflammation, like ibuprofen or naproxen  pamidronate  vaccines  zoledronic acid This list may not describe all possible interactions. Give your health care provider a list of all the medicines, herbs, non-prescription drugs, or dietary supplements you use. Also tell them if you smoke, drink alcohol, or use illegal drugs. Some items may interact with your medicine. What should I watch for while using this medicine? Your condition will be monitored carefully while you are receiving this medicine. This medicine is made from pooled blood donations of many different people. It may be possible to pass an infection in this medicine. However, the donors are screened for infections and all products are tested for HIV and hepatitis. The medicine is treated to kill most or all bacteria and viruses. Talk to your doctor about   the risks and benefits of this medicine. Do not have vaccinations for at least 14 days before, or until at least 3 months after receiving this medicine. What side effects may I notice from receiving this  medicine? Side effects that you should report to your doctor or health care professional as soon as possible:  allergic reactions like skin rash, itching or hives, swelling of the face, lips, or tongue  breathing problems  chest pain or tightness  fever, chills  headache with nausea, vomiting  neck pain or difficulty moving neck  pain when moving eyes  pain, swelling, warmth in the leg  problems with balance, talking, walking  sudden weight gain  swelling of the ankles, feet, hands  trouble passing urine or change in the amount of urine Side effects that usually do not require medical attention (report to your doctor or health care professional if they continue or are bothersome):  dizzy, drowsy  flushing  increased sweating  leg cramps  muscle aches and pains  pain at site where injected This list may not describe all possible side effects. Call your doctor for medical advice about side effects. You may report side effects to FDA at 1-800-FDA-1088. Where should I keep my medicine? Keep out of the reach of children. This drug is usually given in a hospital or clinic and will not be stored at home. In rare cases, some brands of this medicine may be given at home. If you are using this medicine at home, you will be instructed on how to store this medicine. Throw away any unused medicine after the expiration date on the label. NOTE: This sheet is a summary. It may not cover all possible information. If you have questions about this medicine, talk to your doctor, pharmacist, or health care provider.  2020 Elsevier/Gold Standard (2008-09-30 11:44:49)  

## 2019-02-21 NOTE — Progress Notes (Addendum)
[place] OFFICE PROGRESS NOTE   Diagnosis: CLL, pancytopenia  INTERVAL HISTORY:   Meagan Walsh returns as scheduled.  She was transfused a unit of blood 02/14/2019 for a hemoglobin of 8.1.  She has noted dyspnea on exertion for the past few days.  She has also noted several new bruises and "red dots" at the lower legs.  No epistaxis.  No gum bleeding.  No rectal bleeding.  No fever or cough.  Objective:  Vital signs in last 24 hours:  Blood pressure 120/70, pulse 89, temperature 98.5 F (36.9 C), resp. rate 18, height _0  (1.676 m), weight 138 lb 14.4 oz (63 kg), SpO2 100 %.    HEENT: Few petechiae at bilateral buccal regions.  No thrush. Lymphatics: No palpable cervical, supraclavicular or axillary lymph nodes. GI: Abdomen soft and nontender.  No hepatosplenomegaly. Vascular: No leg edema. Neuro: Alert and oriented. Skin: Ecchymoses scattered over the legs.  Petechiae scattered over the lower legs. Left upper extremity PICC without erythema.   Lab Results:  Lab Results  Component Value Date   WBC 3.0 (L) 02/21/2019   HGB 8.5 (L) 02/21/2019   HCT 24.7 (L) 02/21/2019   MCV 86.7 02/21/2019   PLT 5 (LL) 02/21/2019   NEUTROABS 0.7 (L) 02/21/2019    Imaging:  No results found.  Medications: I have reviewed the patient's current medications.  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 Mild splenomegaly.  Ultrasound-guided biopsy of the left inguinal lymph node 09/18/2017, slightly "purulent "fluid aspirated, core biopsy is consistent with an atypical lymphoid proliferation-extensive necrosis with surrounding epithelioid histiocytes, limited intact lymphoid  tissue involved with CLL  Incisional biopsy of a necrotic/purulent left inguinal lymph node on 10/01/2017-extensive necrosis with granulomatous inflammation, small amount of viable lymphoid tissue involved with CLL, AFB and fungal stains negative  Peripheral blood FISH analysis 02/05/2018-deletion 13q14, no evidence of p53 (17p13) deletion, no evidence of 11q22deletion  Bone marrow biopsy 02/26/2018-hypercellular marrow with extensive involvement by CLL, lymphocytes represent85% of all cells  Ibrutinib initiated 04/03/2018  Ibrutinib placed on hold 04/11/2018 due to onset of arthralgias  Ibrutinib resumed 04/16/2018, discontinued 04/25/2018 secondary to severe arthralgias/arthritis  Ibrutinib resumed at a dose of 140 mg daily 05/03/2018  Ibrutinib dose adjusted to 140 mg alternating with 237m 06/25/2018  Ibrutinib discontinued 07/03/2018 secondary to severe arthralgias  Acalabrutinib 08/16/2018, discontinued 11/15/2018 secondary to persistent severe transfusion dependent anemia and neutropenia/thrombocytopenia, last dose 11/14/2018  Bone marrow biopsy 11/21/2018-decreased cellularity, involvement by CLL, decreased erythroid and granulocytic precursors, decreased megakaryocytes  Cycle 1 rituximab 12/06/2018  Bone marrow biopsy 12/24/2018 at [facility]-hypocellular bone marrow (10%) involved by CLL, representing 50% of marrow cellularity; markedly decreased trilineage hematopoiesis including essentially absent erythropoiesis  2.Hypothyroidism 3.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 Augmentin prescribed. 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 Pancytopenia secondary to CLL and a hypocellular bone marrow  G-CSF and Nplate started 54/91/7915 G-CSF changed to daily beginning 12/17/2018; G-CSF discontinued 12/31/2018, last Nplate 01/15/2019  Began prednisone 60 mg daily 01/11/19, tolerating  moderately well except jitteriness, irritability, and difficulty sleeping. Tapered to 40 mg daily x1 week starting 01/24/19, reduce by 10 mg each week until discontinued.  Prednisone discontinued 02/20/2019.  Promacta started 01/20/2019, dose increased to 100 mg daily 02/04/2019; dose increased to 150 mg daily 02/21/2019   Disposition: Meagan Walsh has progressive pancytopenia.  She will receive platelets and 1 unit of blood today.  She will resume Amicar.  She will return for repeat labs 02/24/2019.  We are requesting crossmatched platelets 02/24/2019 if needed.  Dr. Benay Spice will discuss her case with Dr. Prince Solian at [facility] today.  We will see her in follow-up on 02/26/2019.  She will contact the office in the interim with any problems.  Patient seen with Dr. Benay Spice.  25 minutes were spent face-to-face at today's visit with the majority of that time involved in counseling/coordination of care.    Ned Card ANP/GNP-BC   02/21/2019  10:29 AM This was a shared visit with Ned Card.  Meagan Walsh has persistent severe pancytopenia.  The etiology of the pancytopenia remains unclear.  She may have aplastic anemia secondary to drug toxicity or related to CLL.  Her transfusion requirement has diminished over the past month, but she will need platelets and packed red cells today.  The platelet count has not improved with Promacta.  I discussed the case with Dr. Prince Solian.  She recommends continuing the Promacta at an increased dose.  She also recommends a trial of IVIG in case the thrombocytopenia has an immune etiology.  Dr. Prince Solian plans to schedule a bone marrow biopsy and follow-up at [facility] within the next 1-2 weeks.  We  discussed the current status with Meagan Walsh.  Her husband was present for today's visit by telephone.  She will receive platelets and 1 unit of packed red blood cells today.  She will resume Amicar.  I reviewed potential side effects of IVIG with her.  She agrees to proceed.  [name], MD

## 2019-02-21 NOTE — Telephone Encounter (Signed)
Received call report from [facility].  "Today's Pltc = 5."  Reached collaborative with results.    Scheduled A.P.P. F/U today.

## 2019-02-21 NOTE — Patient Instructions (Signed)
Platelet Transfusion A platelet transfusion is a procedure in which you receive donated platelets through an IV. Platelets are tiny pieces of blood cells. When you get an injury, platelets clump together in the area to form a blood clot. This helps stop bleeding and is the beginning of the healing process. If you have too few platelets, your blood may have trouble clotting. This may cause you to bleed and bruise very easily. You may need a platelet transfusion if you have a condition that causes a low number of platelets (thrombocytopenia). A platelet transfusion may be used to stop or prevent excessive bleeding. Tell a health care provider about:  Any reactions you have had during previous transfusions.  Any allergies you have.  All medicines you are taking, including vitamins, herbs, eye drops, creams, and over-the-counter medicines.  Any blood disorders you have.  Any surgeries you have had.  Any medical conditions you have.  Whether you are pregnant or may be pregnant. What are the risks? Generally, this is a safe procedure. However, problems may occur, including:  Fever.  Infection.  Allergic reaction to the donor platelets.  Your body's disease-fighting system (immune system) attacking the donor platelets (hemolytic reaction). This is rare.  A rare reaction that causes lung damage (transfusion-related acute lung injury). What happens before the procedure? Medicines  Ask your health care provider about: ? Changing or stopping your regular medicines. This is especially important if you are taking diabetes medicines or blood thinners. ? Taking medicines such as aspirin and ibuprofen. These medicines can thin your blood. Do not take these medicines unless your health care provider tells you to take them. ? Taking over-the-counter medicines, vitamins, herbs, and supplements. General instructions  You will have a blood test to determine your blood type. Your blood type  determines what kind of platelets you will be given.  Follow instructions from your health care provider about eating or drinking restrictions.  If you have had an allergic reaction to a transfusion in the past, you may be given medicine to help prevent a reaction.  Your temperature, blood pressure, pulse, and breathing will be monitored. What happens during the procedure?   An IV will be inserted into one of your veins.  For your safety, two health care providers will verify your identity along with the donor platelets about to be infused.  A bag of donor platelets will be connected to your IV. The platelets will flow into your bloodstream. This usually takes 30-60 minutes.  Your temperature, blood pressure, pulse, and breathing will be monitored during the transfusion. This helps detect early signs of any reaction.  You will also be monitored for other symptoms that may indicate a reaction, including chills, hives, or itching.  If you have signs of a reaction at any time, your transfusion will be stopped, and you may be given medicine to help manage the reaction.  When your transfusion is complete, your IV will be removed.  Pressure may be applied to the IV site for a few minutes to stop any bleeding.  The IV site will be covered with a bandage (dressing). The procedure may vary among health care providers and hospitals. What happens after the procedure?  Your blood pressure, temperature, pulse, and breathing will be monitored until you leave the hospital or clinic.  You may have some bruising and soreness at your IV site. Follow these instructions at home: Medicines  Take over-the-counter and prescription medicines only as told by your health care provider.  Talk with your health care provider before you take any medicines that contain aspirin or NSAIDs. These medicines increase your risk for dangerous bleeding. General instructions  Change or remove your dressing as told  by your health care provider.  Return to your normal activities as told by your health care provider. Ask your health care provider what activities are safe for you.  Do not take baths, swim, or use a hot tub until your health care provider approves. Ask your health care provider if you may take showers.  Check your IV site every day for signs of infection. Check for: ? Redness, swelling, or pain. ? Fluid or blood. If fluid or blood drains from your IV site, use your hands to press down firmly on a bandage covering the area for a minute or two. Doing this should stop the bleeding. ? Warmth. ? Pus or a bad smell.  Keep all follow-up visits as told by your health care provider. This is important. Contact a health care provider if you have:  A headache that does not go away with medicine.  Hives, rash, or itchy skin.  Nausea or vomiting.  Unusual tiredness or weakness.  Signs of infection at your IV site. Get help right away if:  You have a fever or chills.  You urinate less often than usual.  Your urine is darker colored than normal.  You have any of the following: ? Trouble breathing. ? Pain in your back, abdomen, or chest. ? Cool, clammy skin. ? A fast heartbeat. Summary  Platelets are tiny pieces of blood cells that clump together to form a blood clot when you have an injury. If you have too few platelets, your blood may have trouble clotting.  A platelet transfusion is a procedure in which you receive donated platelets through an IV.  A platelet transfusion may be used to stop or prevent excessive bleeding.  After the procedure, check your IV site every day for signs of infection, including redness, swelling, pain, or warmth. This information is not intended to replace advice given to you by your health care provider. Make sure you discuss any questions you have with your health care provider. Document Released: 05/07/2007 Document Revised: 08/15/2017 Document  Reviewed: 08/15/2017 Elsevier Patient Education  2020 [employer].    Blood Transfusion, Adult  A blood transfusion is a procedure in which you receive donated blood, including plasma, platelets, and red blood cells, through an IV tube. You may need a blood transfusion because of illness, surgery, or injury. The blood may come from a donor. You may also be able to donate blood for yourself (autologous blood donation) before a surgery if you know that you might require a blood transfusion. The blood given in a transfusion is made up of different types of cells. You may receive:  Red blood cells. These carry oxygen to the cells in the body.  White blood cells. These help you fight infections.  Platelets. These help your blood to clot.  Plasma. This is the liquid part of your blood and it helps with fluid imbalances. If you have hemophilia or another clotting disorder, you may also receive other types of blood products. Tell a health care provider about:  Any allergies you have.  All medicines you are taking, including vitamins, herbs, eye drops, creams, and over-the-counter medicines.  Any problems you or family members have had with anesthetic medicines.  Any blood disorders you have.  Any surgeries you have had.  Any medical  conditions you have, including any recent fever or cold symptoms.  Whether you are pregnant or may be pregnant.  Any previous reactions you have had during a blood transfusion. What are the risks? Generally, this is a safe procedure. However, problems may occur, including:  Having an allergic reaction to something in the donated blood. Hives and itching may be symptoms of this type of reaction.  Fever. This may be a reaction to the white blood cells in the transfused blood. Nausea or chest pain may accompany a fever.  Iron overload. This can happen from having many transfusions.  Transfusion-related acute lung injury (TRALI). This is a rare reaction  that causes lung damage. The cause is not known.TRALI can occur within hours of a transfusion or several days later.  Sudden (acute) or delayed hemolytic reactions. This happens if your blood does not match the cells in your transfusion. Your body's defense system (immune system) may try to attack the new cells. This complication is rare. The symptoms include fever, chills, nausea, and low back pain or chest pain.  Infection or disease transmission. This is rare. What happens before the procedure?  You will have a blood test to determine your blood type. This is necessary to know what kind of blood your body will accept and to match it to the donor blood.  If you are going to have a planned surgery, you may be able to do an autologous blood donation. This may be done in case you need to have a transfusion.  If you have had an allergic reaction to a transfusion in the past, you may be given medicine to help prevent a reaction. This medicine may be given to you by mouth or through an IV tube.  You will have your temperature, blood pressure, and pulse monitored before the transfusion.  Follow instructions from your health care provider about eating and drinking restrictions.  Ask your health care provider about: ? Changing or stopping your regular medicines. This is especially important if you are taking diabetes medicines or blood thinners. ? Taking medicines such as aspirin and ibuprofen. These medicines can thin your blood. Do not take these medicines before your procedure if your health care provider instructs you not to. What happens during the procedure?  An IV tube will be inserted into one of your veins.  The bag of donated blood will be attached to your IV tube. The blood will then enter through your vein.  Your temperature, blood pressure, and pulse will be monitored regularly during the transfusion. This monitoring is done to detect early signs of a transfusion reaction.  If you  have any signs or symptoms of a reaction, your transfusion will be stopped and you may be given medicine.  When the transfusion is complete, your IV tube will be removed.  Pressure may be applied to the IV site for a few minutes.  A bandage (dressing) will be applied. The procedure may vary among health care providers and hospitals. What happens after the procedure?  Your temperature, blood pressure, heart rate, breathing rate, and blood oxygen level will be monitored often.  Your blood may be tested to see how you are responding to the transfusion.  You may be warmed with fluids or blankets to maintain a normal body temperature. Summary  A blood transfusion is a procedure in which you receive donated blood, including plasma, platelets, and red blood cells, through an IV tube.  Your temperature, blood pressure, and pulse will be monitored  before, during, and after the transfusion.  Your blood may be tested after the transfusion to see how your body has responded. This information is not intended to replace advice given to you by your health care provider. Make sure you discuss any questions you have with your health care provider. Document Released: 07/07/2000 Document Revised: 05/27/2018 Document Reviewed: 04/06/2016 Elsevier Patient Education  2020 [employer].

## 2019-02-23 LAB — BPAM PLATELET PHERESIS
Blood Product Expiration Date: 202008022359
ISSUE DATE / TIME: 202007311236
Unit Type and Rh: 600

## 2019-02-23 LAB — PREPARE PLATELET PHERESIS: Unit division: 0

## 2019-02-23 LAB — TYPE AND SCREEN
ABO/RH(D): O POS
Antibody Screen: NEGATIVE
Unit division: 0

## 2019-02-23 LAB — BPAM RBC
Blood Product Expiration Date: 202008262359
ISSUE DATE / TIME: 202007311233
Unit Type and Rh: 5100

## 2019-02-24 ENCOUNTER — Inpatient Hospital Stay: Payer: BC Managed Care – PPO | Attending: Oncology

## 2019-02-24 ENCOUNTER — Other Ambulatory Visit: Payer: Self-pay | Admitting: *Deleted

## 2019-02-24 ENCOUNTER — Other Ambulatory Visit: Payer: BC Managed Care – PPO

## 2019-02-24 ENCOUNTER — Telehealth: Payer: Self-pay | Admitting: *Deleted

## 2019-02-24 ENCOUNTER — Inpatient Hospital Stay: Payer: BC Managed Care – PPO

## 2019-02-24 ENCOUNTER — Other Ambulatory Visit: Payer: Self-pay

## 2019-02-24 DIAGNOSIS — D61818 Other pancytopenia: Secondary | ICD-10-CM | POA: Diagnosis not present

## 2019-02-24 DIAGNOSIS — Z791 Long term (current) use of non-steroidal anti-inflammatories (NSAID): Secondary | ICD-10-CM | POA: Insufficient documentation

## 2019-02-24 DIAGNOSIS — F419 Anxiety disorder, unspecified: Secondary | ICD-10-CM | POA: Diagnosis not present

## 2019-02-24 DIAGNOSIS — D801 Nonfamilial hypogammaglobulinemia: Secondary | ICD-10-CM | POA: Diagnosis not present

## 2019-02-24 DIAGNOSIS — Z79899 Other long term (current) drug therapy: Secondary | ICD-10-CM | POA: Insufficient documentation

## 2019-02-24 DIAGNOSIS — Z85820 Personal history of malignant melanoma of skin: Secondary | ICD-10-CM | POA: Insufficient documentation

## 2019-02-24 DIAGNOSIS — D696 Thrombocytopenia, unspecified: Secondary | ICD-10-CM

## 2019-02-24 DIAGNOSIS — C9110 Chronic lymphocytic leukemia of B-cell type not having achieved remission: Secondary | ICD-10-CM | POA: Diagnosis not present

## 2019-02-24 DIAGNOSIS — E039 Hypothyroidism, unspecified: Secondary | ICD-10-CM | POA: Insufficient documentation

## 2019-02-24 DIAGNOSIS — Z95828 Presence of other vascular implants and grafts: Secondary | ICD-10-CM

## 2019-02-24 DIAGNOSIS — D649 Anemia, unspecified: Secondary | ICD-10-CM

## 2019-02-24 LAB — CMP (CANCER CENTER ONLY)
ALT: 18 U/L (ref 0–44)
AST: 17 U/L (ref 15–41)
Albumin: 4 g/dL (ref 3.5–5.0)
Alkaline Phosphatase: 87 U/L (ref 38–126)
Anion gap: 7 (ref 5–15)
BUN: 26 mg/dL — ABNORMAL HIGH (ref 6–20)
CO2: 26 mmol/L (ref 22–32)
Calcium: 9.2 mg/dL (ref 8.9–10.3)
Chloride: 109 mmol/L (ref 98–111)
Creatinine: 0.82 mg/dL (ref 0.44–1.00)
GFR, Est AFR Am: >60 mL/min (ref >60)
GFR, Estimated: >60 mL/min (ref >60)
Glucose, Bld: 109 mg/dL — ABNORMAL HIGH (ref 70–99)
Potassium: 4.1 mmol/L (ref 3.5–5.1)
Sodium: 142 mmol/L (ref 135–145)
Total Bilirubin: 0.5 mg/dL (ref 0.3–1.2)
Total Protein: 5.9 g/dL — ABNORMAL LOW (ref 6.5–8.1)

## 2019-02-24 LAB — CBC WITH DIFFERENTIAL (CANCER CENTER ONLY)
Abs Immature Granulocytes: 0 10*3/uL (ref 0.00–0.07)
Basophils Absolute: 0 10*3/uL (ref 0.0–0.1)
Basophils Relative: 0 %
Eosinophils Absolute: 0 10*3/uL (ref 0.0–0.5)
Eosinophils Relative: 1 %
HCT: 24.8 % — ABNORMAL LOW (ref 36.0–46.0)
Hemoglobin: 8.5 g/dL — ABNORMAL LOW (ref 12.0–15.0)
Immature Granulocytes: 0 %
Lymphocytes Relative: 69 %
Lymphs Abs: 1.2 10*3/uL (ref 0.7–4.0)
MCH: 30.1 pg (ref 26.0–34.0)
MCHC: 34.3 g/dL (ref 30.0–36.0)
MCV: 87.9 fL (ref 80.0–100.0)
Monocytes Absolute: 0.1 10*3/uL (ref 0.1–1.0)
Monocytes Relative: 4 %
Neutro Abs: 0.5 10*3/uL — ABNORMAL LOW (ref 1.7–7.7)
Neutrophils Relative %: 26 %
Platelet Count: 10 10*3/uL — ABNORMAL LOW (ref 150–400)
RBC: 2.82 MIL/uL — ABNORMAL LOW (ref 3.87–5.11)
RDW: 12.4 % (ref 11.5–15.5)
WBC Count: 1.8 10*3/uL — ABNORMAL LOW (ref 4.0–10.5)
nRBC: 0 % (ref 0.0–0.2)

## 2019-02-24 MED ORDER — SODIUM CHLORIDE 0.9% FLUSH
10.0000 mL | Freq: Once | INTRAVENOUS | Status: AC | PRN
  Administered 2019-02-24: 10 mL
  Filled 2019-02-24: qty 10

## 2019-02-24 MED ORDER — HEPARIN SOD (PORK) LOCK FLUSH 100 UNIT/ML IV SOLN
250.0000 [IU] | INTRAVENOUS | Status: AC | PRN
  Administered 2019-02-24: 250 [IU]
  Filled 2019-02-24: qty 5

## 2019-02-24 MED ORDER — SODIUM CHLORIDE 0.9% FLUSH
10.0000 mL | INTRAVENOUS | Status: AC | PRN
  Administered 2019-02-24: 10 mL
  Filled 2019-02-24: qty 10

## 2019-02-24 MED ORDER — SODIUM CHLORIDE 0.9% IV SOLUTION
250.0000 mL | Freq: Once | INTRAVENOUS | Status: DC
  Filled 2019-02-24: qty 250

## 2019-02-24 MED ORDER — HEPARIN SOD (PORK) LOCK FLUSH 100 UNIT/ML IV SOLN
500.0000 [IU] | Freq: Once | INTRAVENOUS | Status: AC | PRN
  Administered 2019-02-24: 250 [IU]
  Filled 2019-02-24: qty 5

## 2019-02-24 NOTE — Patient Instructions (Addendum)
PICC Home Care Guide ° °A peripherally inserted central catheter (PICC) is a form of IV access that allows medicines and IV fluids to be quickly distributed throughout the body. The PICC is a long, thin, flexible tube (catheter) that is inserted into a vein in the upper arm. The catheter ends in a large vein in the chest (superior vena cava, or SVC). After the PICC is inserted, a chest X-ray may be done to make sure that it is in the correct place. °A PICC may be placed for different reasons, such as: °· To give medicines and liquid nutrition. °· To give IV fluids and blood products. °· If there is trouble placing a peripheral intravenous (PIV) catheter. °If taken care of properly, a PICC can remain in place for several months. Having a PICC can also allow a person to go home from the hospital sooner. Medicine and PICC care can be managed at home by a family member, caregiver, or home health care team. °What are the risks? °Generally, having a PICC is safe. However, problems may occur, including: °· A blood clot (thrombus) forming in or at the tip of the PICC. °· A blood clot forming in a vein (deep vein thrombosis) or traveling to the lung (pulmonary embolism). °· Inflammation of the vein (phlebitis) in which the PICC is placed. °· Infection. Central line associated blood stream infection (CLABSI) is a serious infection that often requires hospitalization. °· PICC movement (malposition). The PICC tip may move from its original position due to excessive physical activity, forceful coughing, sneezing, or vomiting. °· A break or cut in the PICC. It is important not to use scissors near the PICC. °· Nerve or tendon irritation or injury during PICC insertion. °How to take care of your PICC °Preventing problems °· You and any caregivers should wash your hands often with soap. Wash hands: °? Before touching the PICC line or the infusion device. °? Before changing a bandage (dressing). °· Flush the PICC as told by your  health care provider. Let your health care provider know right away if the PICC is hard to flush or does not flush. Do not use force to flush the PICC. °· Do not use a syringe that is less than 10 mL to flush the PICC. °· Avoid blood pressure checks on the arm in which the PICC is placed. °· Never pull or tug on the PICC. °· Do not take the PICC out yourself. Only a trained clinical professional should remove the PICC. °· Use clean and sterile supplies only. Keep the supplies in a dry place. Do not reuse needles, syringes, or any other supplies. Doing that can lead to infection. °· Keep pets and children away from your PICC line. °· Check the PICC insertion site every day for signs of infection. Check for: °? Leakage. °? Redness, swelling, or pain. °? Fluid or blood. °? Warmth. °? Pus or a bad smell. °PICC dressing care °· Keep your PICC bandage (dressing) clean and dry to prevent infection. °· Do not take baths, swim, or use a hot tub until your health care provider approves. Ask your health care provider if you can take showers. You may only be allowed to take sponge baths for bathing. When you are allowed to shower: °? Ask your health care provider to teach you how to wrap the PICC line. °? Cover the PICC line with clear plastic wrap and tape to keep it dry while showering. °· Follow instructions from your health care provider   about how to take care of your insertion site and dressing. Make sure you: °? Wash your hands with soap and water before you change your bandage (dressing). If soap and water are not available, use hand sanitizer. °? Change your dressing as told by your health care provider. °? Leave stitches (sutures), skin glue, or adhesive strips in place. These skin closures may need to stay in place for 2 weeks or longer. If adhesive strip edges start to loosen and curl up, you may trim the loose edges. Do not remove adhesive strips completely unless your health care provider tells you to do  that. °· Change your PICC dressing if it becomes loose or wet. °General instructions ° °· Carry your PICC identification card or wear a medical alert bracelet at all times. °· Keep the tube clamped at all times, unless it is being used. °· Carry a smooth-edge clamp with you at all times to place on the tube if it breaks. °· Do not use scissors or sharp objects near the tube. °· You may bend your arm and move it freely. If your PICC is near or at the bend of your elbow, avoid activity with repeated motion at the elbow. °· Avoid lifting heavy objects as told by your health care provider. °· Keep all follow-up visits as told by your health care provider. This is important. °Disposal of supplies °· Throw away any syringes in a disposal container that is meant for sharp items (sharps container). You can buy a sharps container from a pharmacy, or you can make one by using an empty hard plastic bottle with a cover. °· Place any used dressings or infusion bags into a plastic bag. Throw that bag in the trash. °Contact a health care provider if: °· You have pain in your arm, ear, face, or teeth. °· You have a fever or chills. °· You have redness, swelling, or pain around the insertion site. °· You have fluid or blood coming from the insertion site. °· Your insertion site feels warm to the touch. °· You have pus or a bad smell coming from the insertion site. °· Your skin feels hard and raised around the insertion site. °Get help right away if: °· Your PICC is accidentally pulled all the way out. If this happens, cover the insertion site with a bandage or gauze dressing. Do not throw the PICC away. Your health care provider will need to check it. °· Your PICC was tugged or pulled and has partially come out. Do not  push the PICC back in. °· You cannot flush the PICC, it is hard to flush, or the PICC leaks around the insertion site when it is flushed. °· You hear a "flushing" sound when the PICC is flushed. °· You feel your  heart racing or skipping beats. °· There is a hole or tear in the PICC. °· You have swelling in the arm in which the PICC was inserted. °· You have a red streak going up your arm from where the PICC was inserted. °Summary °· A peripherally inserted central catheter (PICC) is a long, thin, flexible tube (catheter) that is inserted into a vein in the upper arm. °· The PICC is inserted using a sterile technique by a specially trained nurse or physician. Only a trained clinical professional should remove it. °· Keep your PICC identification card with you at all times. °· Avoid blood pressure checks on the arm in which the PICC is placed. °· If cared for   properly, a PICC can remain in place for several months. Having a PICC can also allow a person to go home from the hospital sooner. °This information is not intended to replace advice given to you by your health care provider. Make sure you discuss any questions you have with your health care provider. °Document Released: 01/14/2003 Document Revised: 06/22/2017 Document Reviewed: 08/12/2016 °Elsevier Patient Education © 2020 Elsevier Inc. ° °

## 2019-02-24 NOTE — Progress Notes (Signed)
Patient called and left VM that her lower upper gums are starting to bleed intermittently. MD notified.

## 2019-02-24 NOTE — Telephone Encounter (Signed)
Error

## 2019-02-24 NOTE — Patient Instructions (Signed)
Immune Globulin Injection What is this medicine? IMMUNE GLOBULIN (im MUNE GLOB yoo lin) helps to prevent or reduce the severity of certain infections in patients who are at risk. This medicine is collected from the pooled blood of many donors. It is used to treat immune system problems, thrombocytopenia, and Kawasaki syndrome. This medicine may be used for other purposes; ask your health care provider or pharmacist if you have questions. COMMON BRAND NAME(S): ASCENIV, Baygam, BIVIGAM, Carimune, Carimune NF, cutaquig, Cuvitru, Flebogamma, Flebogamma DIF, GamaSTAN, GamaSTAN S/D, Gamimune N, Gammagard, Gammagard S/D, Gammaked, Gammaplex, Gammar-P IV, Gamunex, Gamunex-C, Hizentra, Iveegam, Iveegam EN, Octagam, Panglobulin, Panglobulin NF, panzyga, Polygam S/D, Privigen, Sandoglobulin, Venoglobulin-S, Vigam, Vivaglobulin, Xembify What should I tell my health care provider before I take this medicine? They need to know if you have any of these conditions:   diabetes   extremely low or no immune antibodies in the blood   heart disease   history of blood clots   hyperprolinemia   infection in the blood, sepsis   kidney disease   taking medicine that may change kidney function - ask your health care provider about your medicine   an unusual or allergic reaction to human immune globulin, albumin, maltose, sucrose, polysorbate 80, other medicines, foods, dyes, or preservatives   pregnant or trying to get pregnant   breast-feeding How should I use this medicine? This medicine is for injection into a muscle or infusion into a vein or skin. It is usually given by a health care professional in a hospital or clinic setting. In rare cases, some brands of this medicine might be given at home. You will be taught how to give this medicine. Use exactly as directed. Take your medicine at regular intervals. Do not take your medicine more often than directed. Talk to your pediatrician regarding  the use of this medicine in children. Special care may be needed. Overdosage: If you think you have taken too much of this medicine contact a poison control center or emergency room at once. NOTE: This medicine is only for you. Do not share this medicine with others. What if I miss a dose? It is important not to miss your dose. Call your doctor or health care professional if you are unable to keep an appointment. If you give yourself the medicine and you miss a dose, take it as soon as you can. If it is almost time for your next dose, take only that dose. Do not take double or extra doses. What may interact with this medicine?  aspirin and aspirin-like medicines  cisplatin  cyclosporine  medicines for infection like acyclovir, adefovir, amphotericin B, bacitracin, cidofovir, foscarnet, ganciclovir, gentamicin, pentamidine, vancomycin  NSAIDS, medicines for pain and inflammation, like ibuprofen or naproxen  pamidronate  vaccines  zoledronic acid This list may not describe all possible interactions. Give your health care provider a list of all the medicines, herbs, non-prescription drugs, or dietary supplements you use. Also tell them if you smoke, drink alcohol, or use illegal drugs. Some items may interact with your medicine. What should I watch for while using this medicine? Your condition will be monitored carefully while you are receiving this medicine. This medicine is made from pooled blood donations of many different people. It may be possible to pass an infection in this medicine. However, the donors are screened for infections and all products are tested for HIV and hepatitis. The medicine is treated to kill most or all bacteria and viruses. Talk to your doctor about   the risks and benefits of this medicine. Do not have vaccinations for at least 14 days before, or until at least 3 months after receiving this medicine. What side effects may I notice from receiving this  medicine? Side effects that you should report to your doctor or health care professional as soon as possible:  allergic reactions like skin rash, itching or hives, swelling of the face, lips, or tongue  breathing problems  chest pain or tightness  fever, chills  headache with nausea, vomiting  neck pain or difficulty moving neck  pain when moving eyes  pain, swelling, warmth in the leg  problems with balance, talking, walking  sudden weight gain  swelling of the ankles, feet, hands  trouble passing urine or change in the amount of urine Side effects that usually do not require medical attention (report to your doctor or health care professional if they continue or are bothersome):  dizzy, drowsy  flushing  increased sweating  leg cramps  muscle aches and pains  pain at site where injected This list may not describe all possible side effects. Call your doctor for medical advice about side effects. You may report side effects to FDA at 1-800-FDA-1088. Where should I keep my medicine? Keep out of the reach of children. This drug is usually given in a hospital or clinic and will not be stored at home. In rare cases, some brands of this medicine may be given at home. If you are using this medicine at home, you will be instructed on how to store this medicine. Throw away any unused medicine after the expiration date on the label. NOTE: This sheet is a summary. It may not cover all possible information. If you have questions about this medicine, talk to your doctor, pharmacist, or health care provider.  2020 Elsevier/Gold Standard (2008-09-30 11:44:49)  

## 2019-02-25 ENCOUNTER — Other Ambulatory Visit: Payer: Self-pay

## 2019-02-25 ENCOUNTER — Inpatient Hospital Stay: Payer: BC Managed Care – PPO

## 2019-02-25 ENCOUNTER — Inpatient Hospital Stay (HOSPITAL_BASED_OUTPATIENT_CLINIC_OR_DEPARTMENT_OTHER): Payer: BC Managed Care – PPO | Admitting: Medical

## 2019-02-25 VITALS — BP 112/65 | HR 83 | Temp 99.5°F | Resp 18

## 2019-02-25 DIAGNOSIS — C9110 Chronic lymphocytic leukemia of B-cell type not having achieved remission: Secondary | ICD-10-CM | POA: Diagnosis not present

## 2019-02-25 DIAGNOSIS — T8090XA Unspecified complication following infusion and therapeutic injection, initial encounter: Secondary | ICD-10-CM | POA: Diagnosis not present

## 2019-02-25 DIAGNOSIS — Z85820 Personal history of malignant melanoma of skin: Secondary | ICD-10-CM | POA: Diagnosis not present

## 2019-02-25 DIAGNOSIS — E039 Hypothyroidism, unspecified: Secondary | ICD-10-CM | POA: Diagnosis not present

## 2019-02-25 DIAGNOSIS — Z79899 Other long term (current) drug therapy: Secondary | ICD-10-CM | POA: Diagnosis not present

## 2019-02-25 DIAGNOSIS — F419 Anxiety disorder, unspecified: Secondary | ICD-10-CM | POA: Diagnosis not present

## 2019-02-25 DIAGNOSIS — D696 Thrombocytopenia, unspecified: Secondary | ICD-10-CM

## 2019-02-25 DIAGNOSIS — Z95828 Presence of other vascular implants and grafts: Secondary | ICD-10-CM

## 2019-02-25 DIAGNOSIS — Z791 Long term (current) use of non-steroidal anti-inflammatories (NSAID): Secondary | ICD-10-CM | POA: Diagnosis not present

## 2019-02-25 DIAGNOSIS — D61818 Other pancytopenia: Secondary | ICD-10-CM | POA: Diagnosis not present

## 2019-02-25 DIAGNOSIS — D801 Nonfamilial hypogammaglobulinemia: Secondary | ICD-10-CM | POA: Diagnosis not present

## 2019-02-25 LAB — IGG, IGA, IGM
IgA: 28 mg/dL — ABNORMAL LOW (ref 87–352)
IgG (Immunoglobin G), Serum: 311 mg/dL — ABNORMAL LOW (ref 586–1602)
IgM (Immunoglobulin M), Srm: 37 mg/dL (ref 26–217)

## 2019-02-25 LAB — PREPARE PLATELET PHERESIS: Unit division: 0

## 2019-02-25 LAB — BPAM PLATELET PHERESIS
Blood Product Expiration Date: 202008042359
ISSUE DATE / TIME: 202008031456
Unit Type and Rh: 5100

## 2019-02-25 MED ORDER — DIPHENHYDRAMINE HCL 25 MG PO CAPS
ORAL_CAPSULE | ORAL | Status: AC
  Filled 2019-02-25: qty 1

## 2019-02-25 MED ORDER — SODIUM CHLORIDE 0.9% FLUSH
3.0000 mL | Freq: Once | INTRAVENOUS | Status: AC | PRN
  Administered 2019-02-25: 3 mL
  Filled 2019-02-25: qty 10

## 2019-02-25 MED ORDER — IMMUNE GLOBULIN (HUMAN) 5 GM/50ML IV SOLN
1.0000 g/kg | INTRAVENOUS | Status: DC
  Filled 2019-02-25: qty 50

## 2019-02-25 MED ORDER — HEPARIN SOD (PORK) LOCK FLUSH 100 UNIT/ML IV SOLN
250.0000 [IU] | Freq: Once | INTRAVENOUS | Status: AC | PRN
  Administered 2019-02-25: 250 [IU]
  Filled 2019-02-25: qty 5

## 2019-02-25 MED ORDER — DIPHENHYDRAMINE HCL 25 MG PO TABS
25.0000 mg | ORAL_TABLET | Freq: Once | ORAL | Status: AC
  Administered 2019-02-25: 25 mg via ORAL
  Filled 2019-02-25: qty 1

## 2019-02-25 MED ORDER — ACETAMINOPHEN 325 MG PO TABS
650.0000 mg | ORAL_TABLET | Freq: Once | ORAL | Status: AC
  Administered 2019-02-25: 650 mg via ORAL

## 2019-02-25 MED ORDER — FAMOTIDINE IN NACL 20-0.9 MG/50ML-% IV SOLN
20.0000 mg | Freq: Once | INTRAVENOUS | Status: AC
  Administered 2019-02-25: 20 mg via INTRAVENOUS

## 2019-02-25 MED ORDER — PROMETHAZINE HCL 25 MG/ML IJ SOLN
12.5000 mg | Freq: Four times a day (QID) | INTRAMUSCULAR | Status: DC | PRN
  Administered 2019-02-25: 12.5 mg via INTRAVENOUS

## 2019-02-25 MED ORDER — ALTEPLASE 2 MG IJ SOLR
2.0000 mg | Freq: Once | INTRAMUSCULAR | Status: DC | PRN
  Filled 2019-02-25: qty 2

## 2019-02-25 MED ORDER — IMMUNE GLOBULIN (HUMAN) 5 GM/50ML IV SOLN
1.0000 g/kg | Freq: Once | INTRAVENOUS | Status: AC
  Administered 2019-02-25: 65 g via INTRAVENOUS
  Filled 2019-02-25: qty 50

## 2019-02-25 MED ORDER — ACETAMINOPHEN 325 MG PO TABS
ORAL_TABLET | ORAL | Status: AC
  Filled 2019-02-25: qty 2

## 2019-02-25 MED ORDER — PROMETHAZINE HCL 25 MG/ML IJ SOLN
INTRAMUSCULAR | Status: AC
  Filled 2019-02-25: qty 1

## 2019-02-25 MED ORDER — DEXTROSE 5 % IV SOLN
Freq: Once | INTRAVENOUS | Status: AC
  Administered 2019-02-25: 13:00:00 via INTRAVENOUS
  Filled 2019-02-25: qty 250

## 2019-02-25 NOTE — Progress Notes (Signed)
    DATE:  02/25/2019                                          X  INFUSION REACTION   MD:  Dr. Dominica Severin B. Sherrill    AGENT/BLOOD PRODUCT RECEIVING TODAY:               Privigen    AGENT/BLOOD PRODUCT RECEIVING IMMEDIATELY PRIOR TO REACTION:         Privigen   VS: BP:      106/79   P:        89       SPO2:        100% on room air                 REACTION(S):           Chills, nausea and vomiting   PREMEDS:       Tylenol 650 mg p.o. x1 and Benadryl   INTERVENTION: Privigen was paused.  The patient was given Pepcid 20 mg IV x1 and Phenergan 12.5 mg IV x1 after she developed nausea and vomiting.  She requested another dose of Tylenol and was given 650 mg p.o. x1.   Review of Systems  Review of Systems  Constitutional: Positive for chills. Negative for diaphoresis and fever.  HENT: Negative for trouble swallowing and voice change.   Respiratory: Negative for cough, chest tightness, shortness of breath and wheezing.   Cardiovascular: Negative for chest pain and palpitations.  Gastrointestinal: Positive for nausea and vomiting. Negative for abdominal pain, constipation and diarrhea.  Musculoskeletal: Negative for back pain and myalgias.  Neurological: Negative for dizziness, light-headedness and headaches.     Physical Exam  Physical Exam Constitutional:      General: She is not in acute distress.    Appearance: She is not diaphoretic.  HENT:     Head: Normocephalic and atraumatic.  Cardiovascular:     Rate and Rhythm: Normal rate and regular rhythm.     Heart sounds: Normal heart sounds. No murmur. No friction rub. No gallop.   Pulmonary:     Effort: Pulmonary effort is normal. No respiratory distress.     Breath sounds: Normal breath sounds. No wheezing or rales.  Skin:    General: Skin is warm and dry.     Findings: No erythema or rash.  Neurological:     Mental Status: She is alert.     OUTCOME:                Privigen was restarted at 1 level below her symptoms had  begun after her symptoms abated with the above intervention.  She was able to complete the remainder of her infusion without any further issues of concern today.   Sandi Mealy, MHS, PA-C

## 2019-02-25 NOTE — Progress Notes (Signed)
At the end of 15 min portion of IVIG at pt's max rate (302 mL/hr for 27mLs) pt began complaining of feeling extremely cold and like her "teeth are chattering". IVIG paused and D5W line opened wide. Alfredia Client called to chairside and vitals cycled through. Vitals stable with a small increase in pt's oral temperature (99.1). Received orders for 20 mg Pepcid IVPB. Orders placed and medication administered at 15:23. During Pepcid infusion pt began c/o feeling nauseated and like she needed to use the bathroom. Pepcid infusion finished at 15:27 and pt ambulated with assistance to bathroom.   Upon returning from bathroom pt stated she still felt cold, but that the nausea had improved. Updated Alfredia Client and was instructed to observe pt a little longer and as long as symptoms didn't worsen or new ones present, OK to restart IVIG.   At 15:45 pt began vomiting. Received orders for 12.5 mg Phenergan IV. Orders placed and administered at 15:47. After phenergan push and sitting for a few minutes pt stated she was feeling better and was not cold anymore. Pt then requested another dose of PO Tylenol. Received verbal orders for 650 mg Tylenol PO from [facility], orders placed and medication administered at 15:54.  Pt requesting to restart IVIG. Updated Lucianne Lei on pt's condition and received OK to restart pt at previous rate before pt started feeling poorly, and then try re-challenging her again at the max rate. IVIG restarted at 16:06 at 130mL/hr for 69mLs. Report given to [facility]. Pt knows to alert staff should her symptoms return or new one's arise.

## 2019-02-25 NOTE — Patient Instructions (Signed)
Immune Globulin Injection What is this medicine? IMMUNE GLOBULIN (im MUNE GLOB yoo lin) helps to prevent or reduce the severity of certain infections in patients who are at risk. This medicine is collected from the pooled blood of many donors. It is used to treat immune system problems, thrombocytopenia, and Kawasaki syndrome. This medicine may be used for other purposes; ask your health care provider or pharmacist if you have questions. COMMON BRAND NAME(S): ASCENIV, Baygam, BIVIGAM, Carimune, Carimune NF, cutaquig, Cuvitru, Flebogamma, Flebogamma DIF, GamaSTAN, GamaSTAN S/D, Gamimune N, Gammagard, Gammagard S/D, Gammaked, Gammaplex, Gammar-P IV, Gamunex, Gamunex-C, Hizentra, Iveegam, Iveegam EN, Octagam, Panglobulin, Panglobulin NF, panzyga, Polygam S/D, Privigen, Sandoglobulin, Venoglobulin-S, Vigam, Vivaglobulin, Xembify What should I tell my health care provider before I take this medicine? They need to know if you have any of these conditions:   diabetes   extremely low or no immune antibodies in the blood   heart disease   history of blood clots   hyperprolinemia   infection in the blood, sepsis   kidney disease   taking medicine that may change kidney function - ask your health care provider about your medicine   an unusual or allergic reaction to human immune globulin, albumin, maltose, sucrose, polysorbate 80, other medicines, foods, dyes, or preservatives   pregnant or trying to get pregnant   breast-feeding How should I use this medicine? This medicine is for injection into a muscle or infusion into a vein or skin. It is usually given by a health care professional in a hospital or clinic setting. In rare cases, some brands of this medicine might be given at home. You will be taught how to give this medicine. Use exactly as directed. Take your medicine at regular intervals. Do not take your medicine more often than directed. Talk to your pediatrician regarding  the use of this medicine in children. Special care may be needed. Overdosage: If you think you have taken too much of this medicine contact a poison control center or emergency room at once. NOTE: This medicine is only for you. Do not share this medicine with others. What if I miss a dose? It is important not to miss your dose. Call your doctor or health care professional if you are unable to keep an appointment. If you give yourself the medicine and you miss a dose, take it as soon as you can. If it is almost time for your next dose, take only that dose. Do not take double or extra doses. What may interact with this medicine?  aspirin and aspirin-like medicines  cisplatin  cyclosporine  medicines for infection like acyclovir, adefovir, amphotericin B, bacitracin, cidofovir, foscarnet, ganciclovir, gentamicin, pentamidine, vancomycin  NSAIDS, medicines for pain and inflammation, like ibuprofen or naproxen  pamidronate  vaccines  zoledronic acid This list may not describe all possible interactions. Give your health care provider a list of all the medicines, herbs, non-prescription drugs, or dietary supplements you use. Also tell them if you smoke, drink alcohol, or use illegal drugs. Some items may interact with your medicine. What should I watch for while using this medicine? Your condition will be monitored carefully while you are receiving this medicine. This medicine is made from pooled blood donations of many different people. It may be possible to pass an infection in this medicine. However, the donors are screened for infections and all products are tested for HIV and hepatitis. The medicine is treated to kill most or all bacteria and viruses. Talk to your doctor about   the risks and benefits of this medicine. Do not have vaccinations for at least 14 days before, or until at least 3 months after receiving this medicine. What side effects may I notice from receiving this  medicine? Side effects that you should report to your doctor or health care professional as soon as possible:  allergic reactions like skin rash, itching or hives, swelling of the face, lips, or tongue  breathing problems  chest pain or tightness  fever, chills  headache with nausea, vomiting  neck pain or difficulty moving neck  pain when moving eyes  pain, swelling, warmth in the leg  problems with balance, talking, walking  sudden weight gain  swelling of the ankles, feet, hands  trouble passing urine or change in the amount of urine Side effects that usually do not require medical attention (report to your doctor or health care professional if they continue or are bothersome):  dizzy, drowsy  flushing  increased sweating  leg cramps  muscle aches and pains  pain at site where injected This list may not describe all possible side effects. Call your doctor for medical advice about side effects. You may report side effects to FDA at 1-800-FDA-1088. Where should I keep my medicine? Keep out of the reach of children. This drug is usually given in a hospital or clinic and will not be stored at home. In rare cases, some brands of this medicine may be given at home. If you are using this medicine at home, you will be instructed on how to store this medicine. Throw away any unused medicine after the expiration date on the label. NOTE: This sheet is a summary. It may not cover all possible information. If you have questions about this medicine, talk to your doctor, pharmacist, or health care provider.  2020 Elsevier/Gold Standard (2008-09-30 11:44:49)  Coronavirus (COVID-19) Are you at risk?  Are you at risk for the Coronavirus (COVID-19)?  To be considered HIGH RISK for Coronavirus (COVID-19), you have to meet the following criteria:  . Traveled to [place], [place], [place], [place] or [place]; or in the [place] to [place], [place], [place], or [place];  and have fever, cough, and shortness of breath within the last 2 weeks of travel OR . Been in close contact with a person diagnosed with COVID-19 within the last 2 weeks and have fever, cough, and shortness of breath . IF YOU DO NOT MEET THESE CRITERIA, YOU ARE CONSIDERED LOW RISK FOR COVID-19.  What to do if you are HIGH RISK for COVID-19?  [name] If you are having a medical emergency, call 911. . Seek medical care right away. Before you go to a doctor's office, urgent care or emergency department, call ahead and tell them about your recent travel, contact with someone diagnosed with COVID-19, and your symptoms. You should receive instructions from your physician's office regarding next steps of care.  . When you arrive at healthcare provider, tell the healthcare staff immediately you have returned from visiting [place], [place], [place], [place] or [place]; or traveled in the [place] to [place], [place], [place], or [place]; in the last two weeks or you have been in close contact with a person diagnosed with COVID-19 in the last 2 weeks.   . Tell the health care staff about your symptoms: fever, cough and shortness of breath. . After you have been seen by a medical provider, you will be either: o Tested for (COVID-19) and discharged home on  quarantine except to seek medical care if symptoms worsen, and asked to  - Stay home and avoid contact with others until you get your results (4-5 days)  - Avoid travel on public transportation if possible (such as bus, train, or airplane) or o Sent to the Emergency Department by EMS for evaluation, COVID-19 testing, and possible admission depending on your condition and test results.  What to do if you are LOW RISK for COVID-19?  Reduce your risk of any infection by using the same precautions used for avoiding the common cold or flu:  [name] Wash your hands often with soap and warm water for at least 20 seconds.  If soap and water are not readily  available, use an alcohol-based hand sanitizer with at least 60% alcohol.  . If coughing or sneezing, cover your mouth and nose by coughing or sneezing into the elbow areas of your shirt or coat, into a tissue or into your sleeve (not your hands). . Avoid shaking hands with others and consider head nods or verbal greetings only. . Avoid touching your eyes, nose, or mouth with unwashed hands.  . Avoid close contact with people who are sick. . Avoid places or events with large numbers of people in one location, like concerts or sporting events. . Carefully consider travel plans you have or are making. . If you are planning any travel outside or inside the [place], visit the CDC's Travelers' Health webpage for the latest health notices. . If you have some symptoms but not all symptoms, continue to monitor at home and seek medical attention if your symptoms worsen. . If you are having a medical emergency, call 911.   [place] / e-Visit: [email or web address]         [facility]: [facility]: [phone]                   [facility]: [phone]

## 2019-02-25 NOTE — Progress Notes (Signed)
Will proceed w/ IVIG today. PA team actively working on [place]. Pt has had premeds.  Kennith Center, Pharm.D., CPP 02/25/2019@1 :16 PM

## 2019-02-26 ENCOUNTER — Inpatient Hospital Stay: Payer: BC Managed Care – PPO

## 2019-02-26 ENCOUNTER — Other Ambulatory Visit: Payer: Self-pay

## 2019-02-26 ENCOUNTER — Other Ambulatory Visit: Payer: Self-pay | Admitting: Oncology

## 2019-02-26 ENCOUNTER — Inpatient Hospital Stay (HOSPITAL_BASED_OUTPATIENT_CLINIC_OR_DEPARTMENT_OTHER): Payer: BC Managed Care – PPO | Admitting: Oncology

## 2019-02-26 VITALS — BP 118/68 | HR 81 | Temp 99.1°F | Resp 16

## 2019-02-26 VITALS — BP 118/67 | HR 93 | Temp 98.5°F | Resp 18 | Ht 66.0 in | Wt 140.7 lb

## 2019-02-26 DIAGNOSIS — D696 Thrombocytopenia, unspecified: Secondary | ICD-10-CM

## 2019-02-26 DIAGNOSIS — Z95828 Presence of other vascular implants and grafts: Secondary | ICD-10-CM

## 2019-02-26 DIAGNOSIS — Z79899 Other long term (current) drug therapy: Secondary | ICD-10-CM | POA: Diagnosis not present

## 2019-02-26 DIAGNOSIS — C9110 Chronic lymphocytic leukemia of B-cell type not having achieved remission: Secondary | ICD-10-CM

## 2019-02-26 DIAGNOSIS — D61818 Other pancytopenia: Secondary | ICD-10-CM | POA: Diagnosis not present

## 2019-02-26 DIAGNOSIS — Z85820 Personal history of malignant melanoma of skin: Secondary | ICD-10-CM | POA: Diagnosis not present

## 2019-02-26 DIAGNOSIS — D801 Nonfamilial hypogammaglobulinemia: Secondary | ICD-10-CM | POA: Diagnosis not present

## 2019-02-26 DIAGNOSIS — Z791 Long term (current) use of non-steroidal anti-inflammatories (NSAID): Secondary | ICD-10-CM | POA: Diagnosis not present

## 2019-02-26 DIAGNOSIS — E039 Hypothyroidism, unspecified: Secondary | ICD-10-CM | POA: Diagnosis not present

## 2019-02-26 DIAGNOSIS — F419 Anxiety disorder, unspecified: Secondary | ICD-10-CM | POA: Diagnosis not present

## 2019-02-26 LAB — CBC WITH DIFFERENTIAL (CANCER CENTER ONLY)
Abs Immature Granulocytes: 0.01 10*3/uL (ref 0.00–0.07)
Basophils Absolute: 0 10*3/uL (ref 0.0–0.1)
Basophils Relative: 0 %
Eosinophils Absolute: 0 10*3/uL (ref 0.0–0.5)
Eosinophils Relative: 0 %
HCT: 23.2 % — ABNORMAL LOW (ref 36.0–46.0)
Hemoglobin: 7.9 g/dL — ABNORMAL LOW (ref 12.0–15.0)
Immature Granulocytes: 1 %
Lymphocytes Relative: 85 %
Lymphs Abs: 1.2 10*3/uL (ref 0.7–4.0)
MCH: 29.5 pg (ref 26.0–34.0)
MCHC: 34.1 g/dL (ref 30.0–36.0)
MCV: 86.6 fL (ref 80.0–100.0)
Monocytes Absolute: 0 10*3/uL — ABNORMAL LOW (ref 0.1–1.0)
Monocytes Relative: 2 %
Neutro Abs: 0.2 10*3/uL — CL (ref 1.7–7.7)
Neutrophils Relative %: 12 %
Platelet Count: 24 10*3/uL — ABNORMAL LOW (ref 150–400)
RBC: 2.68 MIL/uL — ABNORMAL LOW (ref 3.87–5.11)
RDW: 12.4 % (ref 11.5–15.5)
WBC Count: 1.5 10*3/uL — ABNORMAL LOW (ref 4.0–10.5)
nRBC: 0 % (ref 0.0–0.2)

## 2019-02-26 LAB — SAMPLE TO BLOOD BANK

## 2019-02-26 MED ORDER — METHYLPREDNISOLONE SODIUM SUCC 125 MG IJ SOLR
INTRAMUSCULAR | Status: AC
  Filled 2019-02-26: qty 2

## 2019-02-26 MED ORDER — ACETAMINOPHEN 325 MG PO TABS
ORAL_TABLET | ORAL | Status: AC
  Filled 2019-02-26: qty 2

## 2019-02-26 MED ORDER — DIPHENHYDRAMINE HCL 25 MG PO CAPS
25.0000 mg | ORAL_CAPSULE | Freq: Once | ORAL | Status: AC
  Administered 2019-02-26: 25 mg via ORAL

## 2019-02-26 MED ORDER — METHYLPREDNISOLONE SODIUM SUCC 125 MG IJ SOLR: 60.0000 mg | Freq: Once | INTRAMUSCULAR | Status: DC

## 2019-02-26 MED ORDER — DIPHENHYDRAMINE HCL 25 MG PO CAPS
ORAL_CAPSULE | ORAL | Status: AC
  Filled 2019-02-26: qty 1

## 2019-02-26 MED ORDER — FAMOTIDINE IN NACL 20-0.9 MG/50ML-% IV SOLN
20.0000 mg | Freq: Once | INTRAVENOUS | Status: AC
  Administered 2019-02-26: 20 mg via INTRAVENOUS

## 2019-02-26 MED ORDER — SODIUM CHLORIDE 0.9% FLUSH
10.0000 mL | Freq: Once | INTRAVENOUS | Status: AC
  Administered 2019-02-26: 10 mL via INTRAVENOUS
  Filled 2019-02-26: qty 10

## 2019-02-26 MED ORDER — HEPARIN SOD (PORK) LOCK FLUSH 100 UNIT/ML IV SOLN
250.0000 [IU] | Freq: Once | INTRAVENOUS | Status: AC
  Administered 2019-02-26: 250 [IU] via INTRAVENOUS
  Filled 2019-02-26: qty 5

## 2019-02-26 MED ORDER — METHYLPREDNISOLONE SODIUM SUCC 125 MG IJ SOLR
60.0000 mg | Freq: Once | INTRAMUSCULAR | Status: AC
  Administered 2019-02-26: 60 mg via INTRAVENOUS

## 2019-02-26 MED ORDER — FAMOTIDINE IN NACL 20-0.9 MG/50ML-% IV SOLN
INTRAVENOUS | Status: AC
  Filled 2019-02-26: qty 50

## 2019-02-26 MED ORDER — ACETAMINOPHEN 325 MG PO TABS
650.0000 mg | ORAL_TABLET | Freq: Once | ORAL | Status: AC
  Administered 2019-02-26: 650 mg via ORAL

## 2019-02-26 MED ORDER — IMMUNE GLOBULIN (HUMAN) 5 GM/50ML IV SOLN
1.0000 g/kg | Freq: Once | INTRAVENOUS | Status: AC
  Administered 2019-02-26: 65 g via INTRAVENOUS
  Filled 2019-02-26: qty 50

## 2019-02-26 MED ORDER — FAMOTIDINE IN NACL 20-0.9 MG/50ML-% IV SOLN: 20.0000 mg | Freq: Once | INTRAVENOUS | Status: DC

## 2019-02-26 MED ORDER — DEXTROSE 5 % IV SOLN
INTRAVENOUS | Status: DC
  Administered 2019-02-26: 11:00:00 via INTRAVENOUS
  Filled 2019-02-26: qty 250

## 2019-02-26 NOTE — Progress Notes (Signed)
Spoke with Dr. Benay Spice regarding infusion titration. Dr. Benay Spice advised to titrate to a max rate of 480 mg/kg/hr. Titration increments modified due to h/o hypersensitivity reaction with yesterday's infusion. Patient aware.

## 2019-02-26 NOTE — Progress Notes (Signed)
[facility] OFFICE PROGRESS NOTE   Diagnosis: CLL, pancytopenia  INTERVAL HISTORY:   Meagan Walsh returns as scheduled.  She was transfused with packed red blood cells and platelets on 02/21/2019.  She received platelets again on 02/24/2019. She had gum oozing earlier this week.  This has resolved.  No other bleeding. She completed a first treatment with IVIG yesterday.  She developed chills and had an episode of nausea/vomiting during the IVIG infusion.  The infusion was paused and she received Pepcid and Phenergan.  Her symptoms resolved.  She completed the IVIG infusion without recurrent symptoms.  No dyspnea.  Objective:  Vital signs in last 24 hours:  Blood pressure 118/67, pulse 93, temperature 98.5 F (36.9 C), temperature source Oral, resp. rate 18, height '5\' 6"'  (1.676 m), weight 140 lb 11.2 oz (63.8 kg), SpO2 100 %.    HEENT: No thrush or bleeding Resp: Lungs clear bilaterally Cardio: Regular rate and rhythm Vascular: No leg edema  Skin: Resolving ecchymoses over the extremities  Portacath/PICC-without erythema  Lab Results:  Lab Results  Component Value Date   WBC 1.5 (L) 02/26/2019   HGB 7.9 (L) 02/26/2019   HCT 23.2 (L) 02/26/2019   MCV 86.6 02/26/2019   PLT 24 (L) 02/26/2019   NEUTROABS 0.2 (LL) 02/26/2019    CMP  Lab Results  Component Value Date   NA 142 02/24/2019   K 4.1 02/24/2019   CL 109 02/24/2019   CO2 26 02/24/2019   GLUCOSE 109 (H) 02/24/2019   BUN 26 (H) 02/24/2019   CREATININE 0.82 02/24/2019   CALCIUM 9.2 02/24/2019   PROT 5.9 (L) 02/24/2019   ALBUMIN 4.0 02/24/2019   AST 17 02/24/2019   ALT 18 02/24/2019   ALKPHOS 87 02/24/2019   BILITOT 0.5 02/24/2019   GFRNONAA >60 02/24/2019   GFRAA >60 02/24/2019     Medications: I have reviewed the patient's current medications.   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 Mild splenomegaly.  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, no evidence of 11q22deletion  Bone marrow biopsy 02/26/2018-hypercellular marrow with extensive involvement by CLL, lymphocytes represent85% of all cells  Ibrutinib initiated 04/03/2018  Ibrutinib placed on hold 04/11/2018 due to onset of arthralgias  Ibrutinib resumed 04/16/2018, discontinued 04/25/2018 secondary to severe arthralgias/arthritis  Ibrutinib resumed at a dose of 140 mg daily 05/03/2018  Ibrutinib dose adjusted to 140 mg alternating with 232m 06/25/2018  Ibrutinib discontinued 07/03/2018 secondary to severe arthralgias  Acalabrutinib 08/16/2018, discontinued 11/15/2018 secondary to persistent severe transfusion dependent anemia and neutropenia/thrombocytopenia, last dose 11/14/2018  Bone marrow biopsy 11/21/2018-decreased cellularity, involvement by CLL, decreased erythroid and granulocytic precursors, decreased megakaryocytes  Cycle 1 rituximab 12/06/2018  Bone marrow biopsy 12/24/2018 at [facility]-hypocellular bone marrow (10%) involved by CLL, representing 50% of marrow cellularity; markedly decreased trilineage hematopoiesis including essentially absent  erythropoiesis  2.Hypothyroidism 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 Augmentin prescribed. 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 Pancytopenia secondary to CLL and a hypocellular bone marrow  G-CSF and Nplate started 9/41/7408, G-CSF changed to daily beginning 12/17/2018; G-CSF discontinued 12/31/2018, last Nplate 01/15/2019  Began prednisone 60 mg daily 01/11/19, tolerating moderately well except jitteriness, irritability, and difficulty sleeping. Tapered to 40 mg daily x1 week starting 01/24/19, reduce by 10 mg each week until discontinued.  Prednisone discontinued 02/20/2019.  Promacta started 01/20/2019, dose increased to 100 mg daily 02/04/2019; dose increased to 150 mg daily 02/21/2019  IVIG daily for 2 days beginning 02/25/2019     Disposition: Meagan Walsh appears unchanged.  She has persistent severe pancytopenia.  The neutrophil count and platelets have declined over the past few weeks.  The neutrophil count had improved and the platelets stabilized last month.  She may have had improvement with prednisone or the single dose of rituximab.  The platelet count is higher today.  This may be related to the platelet transfusion given on 02/24/2019 or IVIG.  She will complete another dose of IVIG today.  We will add Pepcid and Solu-Medrol premedication.  Meagan Walsh will return for a CBC and transfusion support as needed on 02/28/2019.  I will communicate with Dr. Prince Solian regarding the plan for a bone marrow biopsy and beginning immunosuppressive therapy.  Her husband was present for today's  visit by telephone.  Betsy Coder, MD  02/26/2019  10:49 AM

## 2019-02-26 NOTE — Patient Instructions (Signed)
Immune Globulin Injection What is this medicine? IMMUNE GLOBULIN (im MUNE GLOB yoo lin) helps to prevent or reduce the severity of certain infections in patients who are at risk. This medicine is collected from the pooled blood of many donors. It is used to treat immune system problems, thrombocytopenia, and Kawasaki syndrome. This medicine may be used for other purposes; ask your health care provider or pharmacist if you have questions. COMMON BRAND NAME(S): ASCENIV, Baygam, BIVIGAM, Carimune, Carimune NF, cutaquig, Cuvitru, Flebogamma, Flebogamma DIF, GamaSTAN, GamaSTAN S/D, Gamimune N, Gammagard, Gammagard S/D, Gammaked, Gammaplex, Gammar-P IV, Gamunex, Gamunex-C, Hizentra, Iveegam, Iveegam EN, Octagam, Panglobulin, Panglobulin NF, panzyga, Polygam S/D, Privigen, Sandoglobulin, Venoglobulin-S, Vigam, Vivaglobulin, Xembify What should I tell my health care provider before I take this medicine? They need to know if you have any of these conditions:   diabetes   extremely low or no immune antibodies in the blood   heart disease   history of blood clots   hyperprolinemia   infection in the blood, sepsis   kidney disease   taking medicine that may change kidney function - ask your health care provider about your medicine   an unusual or allergic reaction to human immune globulin, albumin, maltose, sucrose, polysorbate 80, other medicines, foods, dyes, or preservatives   pregnant or trying to get pregnant   breast-feeding How should I use this medicine? This medicine is for injection into a muscle or infusion into a vein or skin. It is usually given by a health care professional in a hospital or clinic setting. In rare cases, some brands of this medicine might be given at home. You will be taught how to give this medicine. Use exactly as directed. Take your medicine at regular intervals. Do not take your medicine more often than directed. Talk to your pediatrician regarding  the use of this medicine in children. Special care may be needed. Overdosage: If you think you have taken too much of this medicine contact a poison control center or emergency room at once. NOTE: This medicine is only for you. Do not share this medicine with others. What if I miss a dose? It is important not to miss your dose. Call your doctor or health care professional if you are unable to keep an appointment. If you give yourself the medicine and you miss a dose, take it as soon as you can. If it is almost time for your next dose, take only that dose. Do not take double or extra doses. What may interact with this medicine?  aspirin and aspirin-like medicines  cisplatin  cyclosporine  medicines for infection like acyclovir, adefovir, amphotericin B, bacitracin, cidofovir, foscarnet, ganciclovir, gentamicin, pentamidine, vancomycin  NSAIDS, medicines for pain and inflammation, like ibuprofen or naproxen  pamidronate  vaccines  zoledronic acid This list may not describe all possible interactions. Give your health care provider a list of all the medicines, herbs, non-prescription drugs, or dietary supplements you use. Also tell them if you smoke, drink alcohol, or use illegal drugs. Some items may interact with your medicine. What should I watch for while using this medicine? Your condition will be monitored carefully while you are receiving this medicine. This medicine is made from pooled blood donations of many different people. It may be possible to pass an infection in this medicine. However, the donors are screened for infections and all products are tested for HIV and hepatitis. The medicine is treated to kill most or all bacteria and viruses. Talk to your doctor about   the risks and benefits of this medicine. Do not have vaccinations for at least 14 days before, or until at least 3 months after receiving this medicine. What side effects may I notice from receiving this  medicine? Side effects that you should report to your doctor or health care professional as soon as possible:  allergic reactions like skin rash, itching or hives, swelling of the face, lips, or tongue  breathing problems  chest pain or tightness  fever, chills  headache with nausea, vomiting  neck pain or difficulty moving neck  pain when moving eyes  pain, swelling, warmth in the leg  problems with balance, talking, walking  sudden weight gain  swelling of the ankles, feet, hands  trouble passing urine or change in the amount of urine Side effects that usually do not require medical attention (report to your doctor or health care professional if they continue or are bothersome):  dizzy, drowsy  flushing  increased sweating  leg cramps  muscle aches and pains  pain at site where injected This list may not describe all possible side effects. Call your doctor for medical advice about side effects. You may report side effects to FDA at 1-800-FDA-1088. Where should I keep my medicine? Keep out of the reach of children. This drug is usually given in a hospital or clinic and will not be stored at home. In rare cases, some brands of this medicine may be given at home. If you are using this medicine at home, you will be instructed on how to store this medicine. Throw away any unused medicine after the expiration date on the label. NOTE: This sheet is a summary. It may not cover all possible information. If you have questions about this medicine, talk to your doctor, pharmacist, or health care provider.  2020 Elsevier/Gold Standard (2008-09-30 11:44:49)  

## 2019-02-26 NOTE — Addendum Note (Signed)
Addended by: Charlyn Minerva on: 02/26/2019 11:26 AM   Modules accepted: Orders

## 2019-02-26 NOTE — Progress Notes (Signed)
MD aware of ANC/Hgb today: Will crossmatch for transfusion on Friday as well as for platelets. Aware of transfusion reaction to IVIG yesterday and is adding premeds today of Solumedrol and Pepcid.

## 2019-02-27 ENCOUNTER — Telehealth: Payer: Self-pay | Admitting: *Deleted

## 2019-02-27 ENCOUNTER — Encounter (HOSPITAL_COMMUNITY): Payer: Self-pay | Admitting: Emergency Medicine

## 2019-02-27 ENCOUNTER — Other Ambulatory Visit: Payer: Self-pay

## 2019-02-27 ENCOUNTER — Other Ambulatory Visit: Payer: Self-pay | Admitting: *Deleted

## 2019-02-27 ENCOUNTER — Telehealth: Payer: Self-pay | Admitting: Oncology

## 2019-02-27 ENCOUNTER — Observation Stay (HOSPITAL_COMMUNITY)
Admission: EM | Admit: 2019-02-27 | Discharge: 2019-02-28 | Disposition: A | Discharge status: Home / Self Care | Payer: BC Managed Care – PPO | Attending: Internal Medicine | Admitting: Internal Medicine

## 2019-02-27 ENCOUNTER — Emergency Department (HOSPITAL_COMMUNITY): Payer: BC Managed Care – PPO

## 2019-02-27 DIAGNOSIS — Z20828 Contact with and (suspected) exposure to other viral communicable diseases: Secondary | ICD-10-CM | POA: Insufficient documentation

## 2019-02-27 DIAGNOSIS — Z85820 Personal history of malignant melanoma of skin: Secondary | ICD-10-CM | POA: Insufficient documentation

## 2019-02-27 DIAGNOSIS — F329 Major depressive disorder, single episode, unspecified: Secondary | ICD-10-CM | POA: Diagnosis not present

## 2019-02-27 DIAGNOSIS — Z0184 Encounter for antibody response examination: Secondary | ICD-10-CM | POA: Diagnosis not present

## 2019-02-27 DIAGNOSIS — M546 Pain in thoracic spine: Secondary | ICD-10-CM | POA: Insufficient documentation

## 2019-02-27 DIAGNOSIS — Z79899 Other long term (current) drug therapy: Secondary | ICD-10-CM | POA: Diagnosis not present

## 2019-02-27 DIAGNOSIS — R112 Nausea with vomiting, unspecified: Secondary | ICD-10-CM | POA: Insufficient documentation

## 2019-02-27 DIAGNOSIS — Z791 Long term (current) use of non-steroidal anti-inflammatories (NSAID): Secondary | ICD-10-CM | POA: Insufficient documentation

## 2019-02-27 DIAGNOSIS — Z975 Presence of (intrauterine) contraceptive device: Secondary | ICD-10-CM | POA: Insufficient documentation

## 2019-02-27 DIAGNOSIS — D696 Thrombocytopenia, unspecified: Secondary | ICD-10-CM

## 2019-02-27 DIAGNOSIS — C9110 Chronic lymphocytic leukemia of B-cell type not having achieved remission: Secondary | ICD-10-CM | POA: Insufficient documentation

## 2019-02-27 DIAGNOSIS — E039 Hypothyroidism, unspecified: Secondary | ICD-10-CM | POA: Diagnosis not present

## 2019-02-27 DIAGNOSIS — G8929 Other chronic pain: Secondary | ICD-10-CM | POA: Insufficient documentation

## 2019-02-27 DIAGNOSIS — D649 Anemia, unspecified: Secondary | ICD-10-CM

## 2019-02-27 DIAGNOSIS — Z79890 Hormone replacement therapy (postmenopausal): Secondary | ICD-10-CM | POA: Diagnosis not present

## 2019-02-27 DIAGNOSIS — R519 Headache, unspecified: Secondary | ICD-10-CM | POA: Diagnosis present

## 2019-02-27 DIAGNOSIS — M542 Cervicalgia: Secondary | ICD-10-CM | POA: Insufficient documentation

## 2019-02-27 DIAGNOSIS — F419 Anxiety disorder, unspecified: Secondary | ICD-10-CM | POA: Insufficient documentation

## 2019-02-27 DIAGNOSIS — Z803 Family history of malignant neoplasm of breast: Secondary | ICD-10-CM | POA: Insufficient documentation

## 2019-02-27 DIAGNOSIS — G030 Nonpyogenic meningitis: Secondary | ICD-10-CM

## 2019-02-27 DIAGNOSIS — R51 Headache: Principal | ICD-10-CM | POA: Insufficient documentation

## 2019-02-27 DIAGNOSIS — G43809 Other migraine, not intractable, without status migrainosus: Secondary | ICD-10-CM

## 2019-02-27 DIAGNOSIS — D61818 Other pancytopenia: Secondary | ICD-10-CM | POA: Insufficient documentation

## 2019-02-27 DIAGNOSIS — D619 Aplastic anemia, unspecified: Secondary | ICD-10-CM

## 2019-02-27 LAB — BASIC METABOLIC PANEL
Anion gap: 7 (ref 5–15)
BUN: 26 mg/dL — ABNORMAL HIGH (ref 6–20)
CO2: 23 mmol/L (ref 22–32)
Calcium: 8.9 mg/dL (ref 8.9–10.3)
Chloride: 109 mmol/L (ref 98–111)
Creatinine, Ser: 0.93 mg/dL (ref 0.44–1.00)
GFR calc Af Amer: >60 mL/min (ref >60)
GFR calc non Af Amer: >60 mL/min (ref >60)
Glucose, Bld: 137 mg/dL — ABNORMAL HIGH (ref 70–99)
Potassium: 3.6 mmol/L (ref 3.5–5.1)
Sodium: 139 mmol/L (ref 135–145)

## 2019-02-27 LAB — CBC WITH DIFFERENTIAL/PLATELET
Abs Immature Granulocytes: 0 10*3/uL (ref 0.00–0.07)
Basophils Absolute: 0 10*3/uL (ref 0.0–0.1)
Basophils Relative: 0 %
Eosinophils Absolute: 0 10*3/uL (ref 0.0–0.5)
Eosinophils Relative: 0 %
HCT: 21.1 % — ABNORMAL LOW (ref 36.0–46.0)
Hemoglobin: 7 g/dL — ABNORMAL LOW (ref 12.0–15.0)
Immature Granulocytes: 0 %
Lymphocytes Relative: 73 %
Lymphs Abs: 0.8 10*3/uL (ref 0.7–4.0)
MCH: 29.4 pg (ref 26.0–34.0)
MCHC: 33.2 g/dL (ref 30.0–36.0)
MCV: 88.7 fL (ref 80.0–100.0)
Monocytes Absolute: 0 10*3/uL — ABNORMAL LOW (ref 0.1–1.0)
Monocytes Relative: 3 %
Neutro Abs: 0.2 10*3/uL — ABNORMAL LOW (ref 1.7–7.7)
Neutrophils Relative %: 24 %
Platelets: 11 10*3/uL — CL (ref 150–400)
RBC: 2.38 MIL/uL — ABNORMAL LOW (ref 3.87–5.11)
RDW: 12.5 % (ref 11.5–15.5)
WBC: 1 10*3/uL — CL (ref 4.0–10.5)
nRBC: 0 % (ref 0.0–0.2)

## 2019-02-27 LAB — TYPE AND SCREEN
ABO/RH(D): O POS
Antibody Screen: NEGATIVE

## 2019-02-27 LAB — HEPATIC FUNCTION PANEL
ALT: 23 U/L (ref 0–44)
AST: 23 U/L (ref 15–41)
Albumin: 3.6 g/dL (ref 3.5–5.0)
Alkaline Phosphatase: 67 U/L (ref 38–126)
Bilirubin, Direct: 0.1 mg/dL (ref 0.0–0.2)
Indirect Bilirubin: 0.2 mg/dL — ABNORMAL LOW (ref 0.3–0.9)
Total Bilirubin: 0.3 mg/dL (ref 0.3–1.2)
Total Protein: 8.5 g/dL — ABNORMAL HIGH (ref 6.5–8.1)

## 2019-02-27 LAB — SARS CORONAVIRUS 2 BY RT PCR (HOSPITAL ORDER, PERFORMED IN ~~LOC~~ HOSPITAL LAB): SARS Coronavirus 2: NEGATIVE

## 2019-02-27 LAB — LIPASE, BLOOD: Lipase: 31 U/L (ref 11–51)

## 2019-02-27 LAB — PREPARE RBC (CROSSMATCH)

## 2019-02-27 LAB — LACTIC ACID, PLASMA: Lactic Acid, Venous: 0.9 mmol/L (ref 0.5–1.9)

## 2019-02-27 MED ORDER — VORTIOXETINE HBR 5 MG PO TABS: 20.0000 mg | ORAL_TABLET | Freq: Every day | ORAL | Status: DC

## 2019-02-27 MED ORDER — ALPRAZOLAM 0.25 MG PO TABS: 0.2500 mg | ORAL_TABLET | Freq: Every day | ORAL | Status: DC | PRN

## 2019-02-27 MED ORDER — ELTROMBOPAG OLAMINE 50 MG PO TABS: 150.0000 mg | ORAL_TABLET | Freq: Every day | ORAL | Status: DC

## 2019-02-27 MED ORDER — SUMATRIPTAN 20 MG/ACT NA SOLN
20.0000 mg | Freq: Once | NASAL | Status: AC
  Administered 2019-02-27: 20 mg via NASAL
  Filled 2019-02-27: qty 1

## 2019-02-27 MED ORDER — VALPROATE SODIUM 500 MG/5ML IV SOLN
1000.0000 mg | Freq: Once | INTRAVENOUS | Status: AC
  Administered 2019-02-27: 1000 mg via INTRAVENOUS
  Filled 2019-02-27: qty 10

## 2019-02-27 MED ORDER — ZOLPIDEM TARTRATE 5 MG PO TABS
5.0000 mg | ORAL_TABLET | Freq: Every evening | ORAL | Status: DC | PRN
  Administered 2019-02-27: 10 mg via ORAL
  Filled 2019-02-27: qty 2

## 2019-02-27 MED ORDER — PROCHLORPERAZINE EDISYLATE 10 MG/2ML IJ SOLN
10.0000 mg | Freq: Once | INTRAMUSCULAR | Status: AC
  Administered 2019-02-27: 10 mg via INTRAVENOUS
  Filled 2019-02-27: qty 2

## 2019-02-27 MED ORDER — VALACYCLOVIR HCL 500 MG PO TABS
500.0000 mg | ORAL_TABLET | Freq: Two times a day (BID) | ORAL | Status: DC
  Administered 2019-02-27: 500 mg via ORAL
  Filled 2019-02-27: qty 1

## 2019-02-27 MED ORDER — DIPHENHYDRAMINE HCL 50 MG/ML IJ SOLN
25.0000 mg | Freq: Once | INTRAMUSCULAR | Status: AC
  Administered 2019-02-27: 25 mg via INTRAVENOUS
  Filled 2019-02-27: qty 1

## 2019-02-27 MED ORDER — FENTANYL CITRATE (PF) 100 MCG/2ML IJ SOLN
50.0000 ug | Freq: Once | INTRAMUSCULAR | Status: AC
  Administered 2019-02-27: 50 ug via INTRAVENOUS
  Filled 2019-02-27: qty 2

## 2019-02-27 MED ORDER — FOLIC ACID 1 MG PO TABS: 1.0000 mg | ORAL_TABLET | Freq: Every day | ORAL | Status: DC

## 2019-02-27 MED ORDER — POTASSIUM CHLORIDE IN NACL 20-0.9 MEQ/L-% IV SOLN: INTRAVENOUS | Status: DC

## 2019-02-27 MED ORDER — SUMATRIPTAN SUCCINATE 25 MG PO TABS: 25.0000 mg | ORAL_TABLET | ORAL | Status: DC | PRN

## 2019-02-27 MED ORDER — SODIUM CHLORIDE 0.9 % IV BOLUS
1000.0000 mL | Freq: Once | INTRAVENOUS | Status: AC
  Administered 2019-02-27: 1000 mL via INTRAVENOUS

## 2019-02-27 MED ORDER — ACETAMINOPHEN 325 MG PO TABS: 650.0000 mg | ORAL_TABLET | Freq: Four times a day (QID) | ORAL | Status: DC | PRN

## 2019-02-27 MED ORDER — LEVOTHYROXINE SODIUM 100 MCG PO TABS: 100.0000 ug | ORAL_TABLET | Freq: Every day | ORAL | Status: DC

## 2019-02-27 MED ORDER — HYDROMORPHONE HCL 1 MG/ML IJ SOLN
0.5000 mg | INTRAMUSCULAR | Status: DC | PRN
  Administered 2019-02-27 – 2019-02-28 (×2): 0.5 mg via INTRAVENOUS
  Filled 2019-02-27 (×2): qty 1

## 2019-02-27 MED ORDER — PROMETHAZINE HCL 25 MG/ML IJ SOLN
12.5000 mg | Freq: Four times a day (QID) | INTRAMUSCULAR | Status: DC | PRN
  Administered 2019-02-27: 12.5 mg via INTRAVENOUS
  Filled 2019-02-27: qty 1

## 2019-02-27 MED ORDER — OXYCODONE-ACETAMINOPHEN 5-325 MG PO TABS: 1.0000 | ORAL_TABLET | ORAL | Status: DC | PRN

## 2019-02-27 NOTE — ED Notes (Signed)
Pt placed on hospital bed for comfort.

## 2019-02-27 NOTE — ED Provider Notes (Signed)
[facility] DEPT Provider Note   CSN: [id number] Arrival date & time: 02/27/19  1616    History   Chief Complaint Chief Complaint  Patient presents with  . Headache    HPI Meagan Walsh is a 59 y.o. female.     The history is provided by the patient.  Headache Pain location:  Frontal Quality:  Dull Radiates to:  Does not radiate Severity currently:  9/10 Severity at highest:  9/10 Onset quality:  Gradual Timing:  Constant Progression:  Unchanged Chronicity:  New Context comment:  Patient with history of aplastic anemia, with frontal headache.  Had IVIG yesterday.  Oncologist sent her in for evaluation.  High risk for head bleed.  Concern for possible aplastic meningitis given IVIG treatment. Relieved by:  Nothing Worsened by:  Nothing Associated symptoms: neck pain   Associated symptoms: no abdominal pain, no back pain, no cough, no diarrhea, no ear pain, no eye pain, no fever, no neck stiffness, no seizures, no sore throat, no syncope, no tingling and no vomiting     Past Medical History:  Diagnosis Date  . Anxiety   . Cancer ([place])    CLL  . Degenerative disk disease    Neck--chronic pain  . Depression   . Fibroids    Uterine  . 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 Hypothyroidism   . Lymphocytosis   . Mitral valve prolapse   . Mitral valve prolapse   . Thyroid disease    Hypothyroid    Patient Active Problem List   Diagnosis Date Noted  . Port-A-Cath in place 01/20/2019  . Thrombocytopenia ([place]) 11/28/2018  . Claustrophobia 07/21/2018  . CLL (chronic lymphocytic leukemia) ([place]) 10/24/2017  . Pneumonia 10/23/2017  . Travel advice encounter 06/23/2016    Past Surgical History:  Procedure Laterality Date  . BREAST BIOPSY Left 2000  . CESAREAN SECTION  1996  . MELANOMA EXCISION WITH SENTINEL LYMPH NODE BIOPSY Left 10/01/2017   Procedure: EXCISIONAL BIOPSY OF LEFT INGUINAL LYMPH NODE;   Surgeon: Greer Pickerel, MD;  Location: [facility] ORS;  Service: General;  Laterality: Left;  . REVISION OF ABDOMINAL SCAR    . TONSILLECTOMY     age 29     OB History   No obstetric history on file.      Home Medications    Prior to Admission medications   Medication Sig Start Date End Date Taking? Authorizing Provider  acetaminophen (TYLENOL) 325 MG tablet Take 650 mg by mouth every 6 (six) hours as needed.   Yes [provider]  ALPRAZolam (XANAX) 0.25 MG tablet TAKE 1 TO 2 TABLETS BY MOUTH EVERY DAY AS NEEDED FOR ANXIETY Patient taking differently: Take 0.25-50 mg by mouth daily as needed for anxiety.  01/14/19  Yes Cottle, Billey Co., MD  eltrombopag (PROMACTA) 50 MG tablet Take 2 tablets (100 mg total) by mouth daily. Take on an empty stomach 1 hour before a meal or 2 hours after Patient taking differently: Take 150 mg by mouth daily. Take on an empty stomach 1 hour before a meal or 2 hours after 02/04/19  Yes Ladell Pier, MD  folic acid (FOLVITE) 1 MG tablet Take 1 tablet (1 mg total) by mouth daily. 01/03/19  Yes Owens Shark, NP  Ibuprofen 200 MG CAPS Take 400 mg by mouth as needed (headache).    Yes [provider]  levothyroxine (SYNTHROID, LEVOTHROID) 100 MCG tablet Take  100 mcg by mouth daily.   Yes [provider]  oxyCODONE-acetaminophen (PERCOCET/ROXICET) 5-325 MG tablet Take 1 tablet by mouth every 4 (four) hours as needed for moderate pain or severe pain.   Yes [provider]  valACYclovir (VALTREX) 500 MG tablet TAKE 1 TABLET BY MOUTH TWICE A DAY 02/14/19  Yes Ladell Pier, MD  vortioxetine HBr (TRINTELLIX) 20 MG TABS tablet Take 1 tablet (20 mg total) by mouth daily. 01/28/19  Yes Cottle, Billey Co., MD  zolpidem (AMBIEN) 10 MG tablet TAKE 1/2 TO 1 TABLET BY MOUTH AS NEEDED FOR INSOMNIA. Patient taking differently: Take 5-10 mg by mouth at bedtime as needed for sleep.  11/06/18  Yes Cottle, Billey Co., MD  levonorgestrel (MIRENA) 20  MCG/24HR IUD 1 each by Intrauterine route once.     [provider]    Family History Family History  Problem Relation Age of Onset  . Breast cancer Mother     Social History Social History   Tobacco Use  . Smoking status: Never Smoker  . Smokeless tobacco: Never Used  Substance Use Topics  . Alcohol use: Yes    Frequency: Never    Comment: 2 glasses of wine 2 times per week   . Drug use: No     Allergies   Patient has no known allergies.   Review of Systems Review of Systems  Constitutional: Negative for chills and fever.  HENT: Negative for ear pain and sore throat.   Eyes: Negative for pain and visual disturbance.  Respiratory: Negative for cough and shortness of breath.   Cardiovascular: Negative for chest pain, palpitations and syncope.  Gastrointestinal: Negative for abdominal pain, diarrhea and vomiting.  Genitourinary: Negative for dysuria and hematuria.  Musculoskeletal: Positive for neck pain. Negative for arthralgias, back pain and neck stiffness.  Skin: Negative for color change and rash.  Neurological: Positive for headaches. Negative for seizures and syncope.  All other systems reviewed and are negative.    Physical Exam Updated Vital Signs  ED Triage Vitals  Enc Vitals Group     BP 02/27/19 1628 131/76     Pulse Rate 02/27/19 1628 73     Resp 02/27/19 1628 15     Temp 02/27/19 1628 98.4 F (36.9 C)     Temp Source 02/27/19 1628 Oral     SpO2 02/27/19 1628 100 %     Weight --      Height --      Head Circumference --      Peak Flow --      Pain Score 02/27/19 1640 10     Pain Loc --      Pain Edu? --      Excl. in [place]? --     Physical Exam Vitals signs and nursing note reviewed.  Constitutional:      General: She is not in acute distress.    Appearance: She is well-developed.  HENT:     Head: Normocephalic and atraumatic.     Mouth/Throat:     Mouth: Mucous membranes are moist.     Pharynx: Oropharynx is clear.  Eyes:      Extraocular Movements: Extraocular movements intact.     Right eye: Normal extraocular motion and no nystagmus.     Left eye: Normal extraocular motion and no nystagmus.     Conjunctiva/sclera: Conjunctivae normal.     Pupils: Pupils are equal, round, and reactive to light.     Right eye:  Pupil is round and reactive.     Left eye: Pupil is round and reactive.  Neck:     Musculoskeletal: Normal range of motion and neck supple. No neck rigidity.     Meningeal: Brudzinski's sign absent.  Cardiovascular:     Rate and Rhythm: Normal rate and regular rhythm.     Heart sounds: Normal heart sounds. No murmur.  Pulmonary:     Effort: Pulmonary effort is normal. No respiratory distress.     Breath sounds: Normal breath sounds.  Abdominal:     Palpations: Abdomen is soft.     Tenderness: There is no abdominal tenderness.  Musculoskeletal: Normal range of motion.  Skin:    General: Skin is warm and dry.     Capillary Refill: Capillary refill takes less than 2 seconds.  Neurological:     Mental Status: She is alert and oriented to person, place, and time.     Cranial Nerves: No cranial nerve deficit, dysarthria or facial asymmetry.     Sensory: No sensory deficit.     Motor: No weakness.     Coordination: Coordination normal.     Gait: Gait normal.  Psychiatric:        Mood and Affect: Mood normal.      ED Treatments / Results  Labs (all labs ordered are listed, but only abnormal results are displayed) Labs Reviewed  CBC WITH DIFFERENTIAL/PLATELET - Abnormal; Notable for the following components:      Result Value   WBC 1.0 (*)    RBC 2.38 (*)    Hemoglobin 7.0 (*)    HCT 21.1 (*)    Platelets 11 (*)    Neutro Abs 0.2 (*)    Monocytes Absolute 0.0 (*)    All other components within normal limits  BASIC METABOLIC PANEL - Abnormal; Notable for the following components:   Glucose, Bld 137 (*)    BUN 26 (*)    All other components within normal limits  HEPATIC FUNCTION PANEL -  Abnormal; Notable for the following components:   Total Protein 8.5 (*)    Indirect Bilirubin 0.2 (*)    All other components within normal limits  SARS CORONAVIRUS 2 (HOSPITAL ORDER, PERFORMED IN [facility])  LIPASE, BLOOD  LACTIC ACID, PLASMA    EKG None  Radiology Ct Head Wo Contrast  Result Date: 02/27/2019 CLINICAL DATA:  Headache with nausea vomiting EXAM: CT HEAD WITHOUT CONTRAST TECHNIQUE: Contiguous axial images were obtained from the base of the skull through the vertex without intravenous contrast. COMPARISON:  None. FINDINGS: Brain: No acute territorial infarction, hemorrhage, or intracranial mass. Enlarged extra-axial CSF spaces anteriorly and at the cranial vertex. Ventricles are nonenlarged. Vascular: No hyperdense vessels.  No unexpected calcification Skull: Normal. Negative for fracture or focal lesion. Sinuses/Orbits: No acute finding. Other: None IMPRESSION: 1. No CT evidence for acute intracranial abnormality. 2. Dilated extra-axial CSF spaces anteriorly and at the cranial vertex, either due to cortical atrophy or possible chronic subdural effusions. Electronically Signed   By: Donavan Foil M.D.   On: 02/27/2019 19:42    Procedures Procedures (including critical care time)  Medications Ordered in ED Medications  prochlorperazine (COMPAZINE) injection 10 mg (10 mg Intravenous Given 02/27/19 1855)  sodium chloride 0.9 % bolus 1,000 mL (0 mLs Intravenous Stopped 02/27/19 2050)  diphenhydrAMINE (BENADRYL) injection 25 mg (25 mg Intravenous Given 02/27/19 1901)  fentaNYL (SUBLIMAZE) injection 50 mcg (50 mcg Intravenous Given 02/27/19 1905)  sodium chloride 0.9 %  bolus 1,000 mL (1,000 mLs Intravenous New Bag/Given 02/27/19 2053)  fentaNYL (SUBLIMAZE) injection 50 mcg (50 mcg Intravenous Given 02/27/19 2053)     Initial Impression / Assessment and Plan / ED Course  I have reviewed the triage vital signs and the nursing notes.  Pertinent labs & imaging results that  were available during my care of the patient were reviewed by me and considered in my medical decision making (see chart for details).     Meagan Walsh is a 58 year old female history of CLL, aplastic anemia currently undergoing IVIG therapy who presents to the ED with bad headache.  Patient with normal vitals.  No fever.  Frontal headache started this morning has gradually gotten worse.  Had IVIG yesterday.  Her oncologist came to the ED to talk with me and to evaluate the patient as he is concerned about likely IVIG medication side effect.  IVIG has a known complication of aseptic meningitis.  She has bad headache with some neck pain but no neck stiffness.  She is ambulatory in the room.  She has normal neurological exam.  She has no fever.  She does not look sick or have any altered mental status.  CT scan showed no acute findings.  She continues to have pancytopenia.  White count is 1, platelets are 11.  ANC is 0.2.  Hemoglobin is 7.  Headache got mildly improved with fluids, IV fentanyl, Benadryl, IV Compazine.  Talked with neurology on the phone and they believe that this is also probably aseptic meningitis.  No need for LP or empiric antibiotics at this time given that patient has no fever, no change in her mental status.  Oncology agrees with this plan as well.  Will admit for intractable headache with further IV hydration and IV pain medicine.  She is unable to get an LP due to low platelets at this time.  However, neurology only recommends LP if patient develops fever, change in her mental status.  No need for empiric antibiotics unless that occurs as well.  Was admitted to medicine for further care.  This chart was dictated using voice recognition software.  Despite best efforts to proofread,  errors can occur which can change the documentation meaning.    Final Clinical Impressions(s) / ED Diagnoses   Final diagnoses:  Acute intractable headache, unspecified headache type    ED  Discharge Orders    None       Lennice Sites, DO 02/27/19 2115

## 2019-02-27 NOTE — H&P (Signed)
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  Patient was treated with 2-day course of IVIG on 02/25/2019 and 02/26/2019.  Patient developed severe frontal and bitemporal headache this morning, severe and persistent.  There is associated chills, but no fever.  Patient prefers dark rooms, but will not readily endorse photophobia.  There is associated nausea and vomiting, no clear neck pain or stiffness, no chest pain, no shortness of breath, no change in bowel habits and no urinary symptoms.  Patient denies prior history of migraine.  ER provider has discussed case with hematology/oncology team, Dr. Betsy Coder and the neurology team.  Hospitalist team has been asked to admit patient for supportive care, and concerns for possible aseptic meningitis versus new onset migraine.  ED Course: On presentation to the emergency room, patient was afebrile, blood pressure 138/84, heart rate of 88, respiratory rate of 18 and O2 sat of 100%.  Blood work done revealed WBC of 1, hemoglobin of 7 and platelet count of 11.  As per ER provider, at the hematology team has advised against any transfusion for now.  Patient has received IV fluids and pain medication.  Headache persists.  Pertinent labs: Chemistry reveals sodium of 139, potassium of 3.6, chloride 109, CO2 23, BUN of 26, creatinine of 0.93 with blood sugar of 137.  Lactic acid is 0.9.  CBC reveals WBC of 1, hemoglobin of 7, hematocrit of 21.1, CV of 88.7 with platelet count of 11.  CT head without contrast is negative for acute  intracranial abnormality.  Dilated extra-axial CSF spaces anteriorly and at the cranial vertex was reported.  Imaging: independently reviewed.   Review of Systems:  Negative for fever, visual changes, sore throat, rash, new muscle aches, chest pain, SOB, dysuria, bleeding.  Past Medical History:  Diagnosis Date   Anxiety    Cancer ([facility])    CLL   Degenerative disk disease    Neck--chronic pain   Depression    Fibroids    Uterine   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   Hypothyroidism    Lymphocytosis    Mitral valve prolapse    Mitral valve prolapse    Thyroid disease    Hypothyroid    Past Surgical History:  Procedure Laterality Date   BREAST BIOPSY Left 2000   CESAREAN SECTION  1996   MELANOMA EXCISION WITH SENTINEL LYMPH NODE BIOPSY Left 10/01/2017   Procedure: EXCISIONAL BIOPSY OF LEFT INGUINAL LYMPH NODE;  Surgeon: Greer Pickerel, MD;  Location: [facility];  Service: General;  Laterality: Left;   REVISION OF ABDOMINAL SCAR     TONSILLECTOMY     age 72     reports that she has never smoked. She has never used smokeless tobacco. She reports current alcohol use. She reports that she does not use drugs.  No Known Allergies  Family History  Problem Relation Age of Onset   Breast cancer Mother      Prior to Admission medications   Medication Sig Start Date End Date Taking? Authorizing Provider  acetaminophen (TYLENOL) 325 MG tablet Take 650 mg by mouth every 6 (six)  hours as needed.   Yes [provider]  ALPRAZolam (XANAX) 0.25 MG tablet TAKE 1 TO 2 TABLETS BY MOUTH EVERY DAY AS NEEDED FOR ANXIETY Patient taking differently: Take 0.25-50 mg by mouth daily as needed for anxiety.  01/14/19  Yes Cottle, Billey Co., MD  eltrombopag (PROMACTA) 50 MG tablet Take 2 tablets (100 mg total) by mouth daily. Take on an empty stomach 1 hour before a meal or 2 hours after Patient taking differently: Take 150 mg by mouth daily.  Take on an empty stomach 1 hour before a meal or 2 hours after 02/04/19  Yes Ladell Pier, MD  folic acid (FOLVITE) 1 MG tablet Take 1 tablet (1 mg total) by mouth daily. 01/03/19  Yes Owens Shark, NP  Ibuprofen 200 MG CAPS Take 400 mg by mouth as needed (headache).    Yes [provider]  levothyroxine (SYNTHROID, LEVOTHROID) 100 MCG tablet Take 100 mcg by mouth daily.   Yes [provider]  oxyCODONE-acetaminophen (PERCOCET/ROXICET) 5-325 MG tablet Take 1 tablet by mouth every 4 (four) hours as needed for moderate pain or severe pain.   Yes [provider]  valACYclovir (VALTREX) 500 MG tablet TAKE 1 TABLET BY MOUTH TWICE A DAY 02/14/19  Yes Ladell Pier, MD  vortioxetine HBr (TRINTELLIX) 20 MG TABS tablet Take 1 tablet (20 mg total) by mouth daily. 01/28/19  Yes Cottle, Billey Co., MD  zolpidem (AMBIEN) 10 MG tablet TAKE 1/2 TO 1 TABLET BY MOUTH AS NEEDED FOR INSOMNIA. Patient taking differently: Take 5-10 mg by mouth at bedtime as needed for sleep.  11/06/18  Yes Cottle, Billey Co., MD  levonorgestrel (MIRENA) 20 MCG/24HR IUD 1 each by Intrauterine route once.     [provider]    Physical Exam: Vitals:   02/27/19 1900 02/27/19 2000 02/27/19 2051 02/27/19 2136  BP: 120/81 129/64 133/85 138/84  Pulse: 87 93 86 88  Resp: 16 16 16 18   Temp:      TempSrc:      SpO2: 100% 100% 100% 100%   Constitutional:   Appears calm. Eyes:   Pallor. No jaundice.  ENMT:   external ears, nose appear normal Neck:   Neck is supple. No JVD Respiratory:   CTA bilaterally, no w/r/r.   Respiratory effort normal. No retractions or accessory muscle use Cardiovascular:   S1S2  No LE extremity edema   Abdomen:   Abdomen is soft and non tender. Organs are difficult to assess. Neurologic:   Awake and alert.  Moves all limbs.  Wt Readings from Last 3 Encounters:  02/26/19 63.8 kg  02/21/19 63 kg  02/03/19 62.5 kg    I have personally reviewed  following labs and imaging studies  Labs on Admission:  CBC: Recent Labs  Lab 02/21/19 0945 02/21/19 1550 02/24/19 1353 02/26/19 0937 02/27/19 1913  WBC 3.0*  --  1.8* 1.5* 1.0*  NEUTROABS 0.7*  --  0.5* 0.2* 0.2*  HGB 8.5*  --  8.5* 7.9* 7.0*  HCT 24.7*  --  24.8* 23.2* 21.1*  MCV 86.7  --  87.9 86.6 88.7  PLT 5* 28* 10* 24* 11*   Basic Metabolic Panel: Recent Labs  Lab 02/24/19 1353 02/27/19 1913  NA 142 139  K 4.1 3.6  CL 109 109  CO2 26 23  GLUCOSE 109* 137*  BUN 26* 26*  CREATININE 0.82 0.93  CALCIUM 9.2 8.9   Liver Function Tests: Recent Labs  Lab 02/24/19  1353 02/27/19 1913  AST 17 23  ALT 18 23  ALKPHOS 87 67  BILITOT 0.5 0.3  PROT 5.9* 8.5*  ALBUMIN 4.0 3.6   Recent Labs  Lab 02/27/19 1913  LIPASE 31   No results for input(s): AMMONIA in the last 168 hours. Coagulation Profile: No results for input(s): INR, PROTIME in the last 168 hours. Cardiac Enzymes: No results for input(s): CKTOTAL, CKMB, CKMBINDEX, TROPONINI in the last 168 hours. BNP (last 3 results) No results for input(s): PROBNP in the last 8760 hours. HbA1C: No results for input(s): HGBA1C in the last 72 hours. CBG: No results for input(s): GLUCAP in the last 168 hours. Lipid Profile: No results for input(s): CHOL, HDL, LDLCALC, TRIG, CHOLHDL, LDLDIRECT in the last 72 hours. Thyroid Function Tests: No results for input(s): TSH, T4TOTAL, FREET4, T3FREE, THYROIDAB in the last 72 hours. Anemia Panel: No results for input(s): VITAMINB12, FOLATE, FERRITIN, TIBC, IRON, RETICCTPCT in the last 72 hours. Urine analysis: No results found for: COLORURINE, APPEARANCEUR, LABSPEC, PHURINE, GLUCOSEU, HGBUR, BILIRUBINUR, KETONESUR, PROTEINUR, UROBILINOGEN, NITRITE, LEUKOCYTESUR Sepsis Labs: @LABRCNTIP (procalcitonin:4,lacticidven:4) )No results found for this or any previous visit (from the past 240 hour(s)).    Radiological Exams on Admission: Ct Head Wo Contrast  Result Date:  02/27/2019 CLINICAL DATA:  Headache with nausea vomiting EXAM: CT HEAD WITHOUT CONTRAST TECHNIQUE: Contiguous axial images were obtained from the base of the skull through the vertex without intravenous contrast. COMPARISON:  None. FINDINGS: Brain: No acute territorial infarction, hemorrhage, or intracranial mass. Enlarged extra-axial CSF spaces anteriorly and at the cranial vertex. Ventricles are nonenlarged. Vascular: No hyperdense vessels.  No unexpected calcification Skull: Normal. Negative for fracture or focal lesion. Sinuses/Orbits: No acute finding. Other: None IMPRESSION: 1. No CT evidence for acute intracranial abnormality. 2. Dilated extra-axial CSF spaces anteriorly and at the cranial vertex, either due to cortical atrophy or possible chronic subdural effusions. Electronically Signed   By: Donavan Foil M.D.   On: 02/27/2019 19:42    Active Problems:   Headache   Assessment/Plan Headache, frontal and bitemporal: Worrisome for possible aseptic meningitis versus migraine. Place patient in observation. Supportive care. Trial of antimigraine medication. -We defer the decision to proceed with lumbar puncture to neurology and heme-onc (platelet is 11) -Further management will depend on hospital course.  CLL/pancytopenia: Hematology is managing. See note documented by Dr. Betsy Coder.  Hypothyroidism: Continue current medication Check TSH  DVT prophylaxis: None Code Status: Full code Family Communication: Husband Disposition Plan: Home eventually Consults called: ER has consulted heme-onc and neurology Admission status: Observation   Time spent: 65 minutes  Dana Allan, MD  [facility] Pager #: [phone] 7PM-7AM contact night coverage as above  02/27/2019, 9:54 PM

## 2019-02-27 NOTE — Telephone Encounter (Signed)
Awoke at 0400 with headache rated 5/10. Took #2 Advil and went back to sleep. @0730  Still has headache and repeated #2 Advil. @1300  Headache getting worse 8/10--took #1 Percocet 5/325 and not getting better. Headache is frontal and bilateral temporal. Denies, nausea, balance disturbance or dizziness, visual changes, bleeding, weakness, light sensitivity, rash, swelling or fever. Also adds that since 0730 today her entire back has been hurting and worse with every step she takes. Dr. Benay Spice called patient few minutes later and she had vomited. Was instructed by MD to go to ER.

## 2019-02-27 NOTE — Progress Notes (Signed)
IP PROGRESS NOTE  Subjective:   Meagan Walsh is well-known to me with a history of chronic lymphocytic leukemia and severe pancytopenia.  She was treated with 2 days of IVIG this week beginning on 02/25/2019.  She had chills and an episode of nausea during the first day of IVIG.  She received additional premedication yesterday and tolerated the IVIG without recurrent symptoms.  She developed a severe frontal headache beginning at approximately 4 AM today.  The headache has persisted.  The headache is not relieved with ibuprofen or a Percocet tablet.  She had an episode of vomiting at home.  She reports one loose stool.  She has vomited 2 times in the emergency room. No fever, chills, photophobia, dysuria, or bleeding.  She had gum bleeding prior to receiving platelets earlier this week. She complains of pain in the low posterior neck and upper back when standing.  Objective: Vital signs in last 24 hours: Blood pressure 131/76, pulse 73, temperature 98.4 F (36.9 C), temperature source Oral, resp. rate 15, SpO2 100 %.  Intake/Output from previous day: No intake/output data recorded.  Physical Exam:  HEENT: Oral cavity without bleeding or thrush Abdomen: Soft and nontender Extremities: No leg edema Neurologic: Alert and oriented.  The motor exam appears intact in the upper and lower extremities bilaterally.  The pupils are equal.  The extraocular movements are intact. Musculoskeletal: No neck or spine tenderness, the neck is supple Skin: Resolving ecchymoses over the extremities  Portacath/PICC-without erythema  Lab Results: Recent Labs    02/26/19 0937  WBC 1.5*  HGB 7.9*  HCT 23.2*  PLT 24*    reviewed the patient's current medications.  Assessment/Plan: 1.CLL-diagnosed in August 2010, flow cytometry consistent with CLL Enlarged leftinguinal lymph node January 2019,smallneck/axillary nodes and palpable splenomegaly 09/11/2017  CTson 09/17/2017-3 cm necrotic appearing lymph  node in the left inguinal region, borderline enlarged pelvic/retroperitoneal, chest, and axillary nodes. Mild splenomegaly.  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, no evidence of 11q22deletion  Bone marrow biopsy 02/26/2018-hypercellular marrow with extensive involvement by CLL, lymphocytes represent85% of all cells  Ibrutinib initiated 04/03/2018  Ibrutinib placed on hold 04/11/2018 due to onset of arthralgias  Ibrutinib resumed 04/16/2018, discontinued 04/25/2018 secondary to severe arthralgias/arthritis  Ibrutinib resumed at a dose of 140 mg daily 05/03/2018  Ibrutinib dose adjusted to 140 mg alternating with 274m 06/25/2018  Ibrutinib discontinued 07/03/2018 secondary to severe arthralgias  Acalabrutinib 08/16/2018, discontinued 11/15/2018 secondary to persistent severe transfusion dependent anemia and neutropenia/thrombocytopenia, last dose 11/14/2018  Bone marrow biopsy 11/21/2018-decreased cellularity, involvement by CLL, decreased erythroid and granulocytic precursors, decreased megakaryocytes  Cycle 1 rituximab 12/06/2018  Bone marrow biopsy 12/24/2018 at [facility]-hypocellular bone marrow (10%) involved by CLL, representing 50% of marrow cellularity; markedly decreased trilineage hematopoiesis including essentially absent erythropoiesis  2.Hypothyroidism 3.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 Augmentin prescribed. 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 Pancytopenia secondary to CLL and a hypocellular bone marrow  G-CSF and Nplate started 3/71/0626, G-CSF changed to daily beginning 12/17/2018; G-CSF discontinued 12/31/2018, last Nplate 01/15/2019  Began prednisone 60 mg daily 01/11/19, tolerating moderately well except jitteriness, irritability, and difficulty sleeping. Tapered to 40 mg daily x1 week starting 01/24/19, reduce by 10 mg each week until discontinued.  Prednisone discontinued 02/20/2019.  Promacta started 01/20/2019, dose increased to 100 mg daily 02/04/2019; dose increased to 150 mg daily 02/21/2019  IVIG daily for 2 days beginning 02/25/2019  9.  Severe headache and nausea/vomiting 02/27/2019   [name] presents with a severe headache, neck/upper back pain, and nausea/vomiting in the setting of severe pancytopenia.  She received IVIG over the past 2 days. Her presentation is most likely related to the IVIG therapy with a headache or aseptic meningitis.  Her symptoms are temporally related to IVIG therapy.  She is at risk for a CNS hemorrhage and infection.  I have a low clinical suspicion for bacterial meningitis in the absence of fever and her well appearance.  Recommendations: 1.  CT head to rule out a CNS hemorrhage 2.  Supportive care with intravenous fluids, pain medication, and antiemetics 3.  Consider a trial of antimigraine medication 4.  Check CBC and transfuse platelets for a count of less than 10,000 5.  Neurology need for further evaluation and to recommend treatment options.  Please call oncology  as needed tonight.  I will check on her in the a.m. 02/28/2019  I discussed the case with Dr. Ronnald Nian      LOS: 0 days   Betsy Coder, MD   02/27/2019, 6:08 PM

## 2019-02-27 NOTE — ED Notes (Signed)
Pt Vomit x1, RN made aware

## 2019-02-27 NOTE — Progress Notes (Signed)
Orders placed for irradiated crossmatched platelets to be given on 02/28/2019 and 2 units irradiated RBC as well. Notified Beth in blood bank-they confirmed orders.

## 2019-02-27 NOTE — ED Notes (Signed)
Oncologist at bedside. 

## 2019-02-27 NOTE — ED Notes (Signed)
Husband at bedside.  

## 2019-02-27 NOTE — ED Notes (Signed)
Date and time results received: 02/27/19 1949  Test: Platelet and WBC Critical Value: Platelet 11, and WBC 1.02  Name of Provider Notified:Dr. Ronnald Nian  Orders Received? Or Actions Taken?:

## 2019-02-27 NOTE — Telephone Encounter (Signed)
Confirmed with patient 8/7 appointment per 8/6 schedule message.

## 2019-02-27 NOTE — Telephone Encounter (Signed)
"  [place].  Yes, Dr. Gearldine Shown nurse called me back.  Thank you."

## 2019-02-27 NOTE — Telephone Encounter (Signed)
"  [place] [phone]).  Trying to reach Dr. Gearldine Shown nurse.  I have a headache that won't go away.  I think it is a side effect from treatment I received."

## 2019-02-27 NOTE — ED Triage Notes (Signed)
Patient c/o severe headache with N/V today. Oncologist sent for scan r/t low platelets. Hx leukemia. Reports IVIG treatment the past two days. Denies blurred vision, weakness.

## 2019-02-28 ENCOUNTER — Inpatient Hospital Stay (HOSPITAL_BASED_OUTPATIENT_CLINIC_OR_DEPARTMENT_OTHER): Payer: BC Managed Care – PPO | Admitting: Medical

## 2019-02-28 ENCOUNTER — Other Ambulatory Visit: Payer: Self-pay | Admitting: Medical

## 2019-02-28 ENCOUNTER — Inpatient Hospital Stay: Payer: BC Managed Care – PPO

## 2019-02-28 ENCOUNTER — Other Ambulatory Visit: Payer: BC Managed Care – PPO

## 2019-02-28 ENCOUNTER — Other Ambulatory Visit: Payer: Self-pay | Admitting: *Deleted

## 2019-02-28 ENCOUNTER — Other Ambulatory Visit: Payer: Self-pay

## 2019-02-28 ENCOUNTER — Telehealth: Payer: Self-pay | Admitting: Oncology

## 2019-02-28 DIAGNOSIS — E039 Hypothyroidism, unspecified: Secondary | ICD-10-CM

## 2019-02-28 DIAGNOSIS — F419 Anxiety disorder, unspecified: Secondary | ICD-10-CM | POA: Diagnosis not present

## 2019-02-28 DIAGNOSIS — R51 Headache: Secondary | ICD-10-CM | POA: Diagnosis not present

## 2019-02-28 DIAGNOSIS — C9110 Chronic lymphocytic leukemia of B-cell type not having achieved remission: Secondary | ICD-10-CM

## 2019-02-28 DIAGNOSIS — D619 Aplastic anemia, unspecified: Secondary | ICD-10-CM

## 2019-02-28 DIAGNOSIS — Z791 Long term (current) use of non-steroidal anti-inflammatories (NSAID): Secondary | ICD-10-CM | POA: Diagnosis not present

## 2019-02-28 DIAGNOSIS — Z79899 Other long term (current) drug therapy: Secondary | ICD-10-CM | POA: Diagnosis not present

## 2019-02-28 DIAGNOSIS — D696 Thrombocytopenia, unspecified: Secondary | ICD-10-CM

## 2019-02-28 DIAGNOSIS — R519 Headache, unspecified: Secondary | ICD-10-CM

## 2019-02-28 DIAGNOSIS — D649 Anemia, unspecified: Secondary | ICD-10-CM

## 2019-02-28 DIAGNOSIS — Z85820 Personal history of malignant melanoma of skin: Secondary | ICD-10-CM | POA: Diagnosis not present

## 2019-02-28 DIAGNOSIS — D801 Nonfamilial hypogammaglobulinemia: Secondary | ICD-10-CM | POA: Diagnosis not present

## 2019-02-28 DIAGNOSIS — D61818 Other pancytopenia: Secondary | ICD-10-CM

## 2019-02-28 LAB — MAGNESIUM: Magnesium: 2 mg/dL (ref 1.7–2.4)

## 2019-02-28 LAB — BASIC METABOLIC PANEL
Anion gap: 6 (ref 5–15)
BUN: 17 mg/dL (ref 6–20)
CO2: 24 mmol/L (ref 22–32)
Calcium: 8.7 mg/dL — ABNORMAL LOW (ref 8.9–10.3)
Chloride: 107 mmol/L (ref 98–111)
Creatinine, Ser: 0.88 mg/dL (ref 0.44–1.00)
GFR calc Af Amer: >60 mL/min (ref >60)
GFR calc non Af Amer: >60 mL/min (ref >60)
Glucose, Bld: 112 mg/dL — ABNORMAL HIGH (ref 70–99)
Potassium: 3.7 mmol/L (ref 3.5–5.1)
Sodium: 137 mmol/L (ref 135–145)

## 2019-02-28 LAB — CBC
HCT: 20.1 % — ABNORMAL LOW (ref 36.0–46.0)
Hemoglobin: 6.7 g/dL — CL (ref 12.0–15.0)
MCH: 29.6 pg (ref 26.0–34.0)
MCHC: 33.3 g/dL (ref 30.0–36.0)
MCV: 88.9 fL (ref 80.0–100.0)
Platelets: 10 10*3/uL — CL (ref 150–400)
RBC: 2.26 MIL/uL — ABNORMAL LOW (ref 3.87–5.11)
RDW: 12.3 % (ref 11.5–15.5)
WBC: 1.4 10*3/uL — CL (ref 4.0–10.5)
nRBC: 0 % (ref 0.0–0.2)

## 2019-02-28 LAB — HIV ANTIBODY (ROUTINE TESTING W REFLEX): HIV Screen 4th Generation wRfx: NONREACTIVE

## 2019-02-28 LAB — PHOSPHORUS: Phosphorus: 3.5 mg/dL (ref 2.5–4.6)

## 2019-02-28 MED ORDER — PROCHLORPERAZINE MALEATE 5 MG PO TABS: 5.0000 mg | ORAL_TABLET | Freq: Four times a day (QID) | ORAL | 0 refills | Status: DC | PRN

## 2019-02-28 MED ORDER — OXYCODONE-ACETAMINOPHEN 5-325 MG PO TABS
1.0000 | ORAL_TABLET | Freq: Once | ORAL | Status: AC
  Administered 2019-02-28: 1 via ORAL

## 2019-02-28 MED ORDER — HEPARIN SOD (PORK) LOCK FLUSH 100 UNIT/ML IV SOLN
250.0000 [IU] | INTRAVENOUS | Status: AC | PRN
  Administered 2019-02-28: 250 [IU]
  Filled 2019-02-28: qty 5

## 2019-02-28 MED ORDER — SODIUM CHLORIDE 0.9% FLUSH
10.0000 mL | INTRAVENOUS | Status: AC | PRN
  Administered 2019-02-28: 10 mL
  Filled 2019-02-28: qty 10

## 2019-02-28 MED ORDER — SODIUM CHLORIDE 0.9% IV SOLUTION
250.0000 mL | Freq: Once | INTRAVENOUS | Status: AC
  Administered 2019-02-28: 250 mL via INTRAVENOUS
  Filled 2019-02-28: qty 250

## 2019-02-28 MED ORDER — OXYCODONE-ACETAMINOPHEN 5-325 MG PO TABS
ORAL_TABLET | ORAL | Status: AC
  Filled 2019-02-28: qty 1

## 2019-02-28 MED ORDER — PROCHLORPERAZINE EDISYLATE 10 MG/2ML IJ SOLN
INTRAMUSCULAR | Status: AC
  Filled 2019-02-28: qty 2

## 2019-02-28 MED ORDER — FLUCONAZOLE 100 MG PO TABS: 100.0000 mg | ORAL_TABLET | Freq: Every day | ORAL | 1 refills | Status: DC

## 2019-02-28 MED ORDER — PROCHLORPERAZINE EDISYLATE 10 MG/2ML IJ SOLN
10.0000 mg | Freq: Once | INTRAMUSCULAR | Status: AC
  Administered 2019-02-28: 10 mg via INTRAVENOUS
  Filled 2019-02-28: qty 2

## 2019-02-28 MED ORDER — LEVOFLOXACIN 500 MG PO TABS: 500.0000 mg | ORAL_TABLET | Freq: Every day | ORAL | 1 refills | Status: DC

## 2019-02-28 NOTE — Patient Instructions (Signed)
Blood Transfusion, Adult, Care After This sheet gives you information about how to care for yourself after your procedure. Your health care provider may also give you more specific instructions. If you have problems or questions, contact your health care provider. What can I expect after the procedure? After your procedure, it is common to have:  Bruising and soreness where the IV tube was inserted.  Headache. Follow these instructions at home:   Take over-the-counter and prescription medicines only as told by your health care provider.  Return to your normal activities as told by your health care provider.  Follow instructions from your health care provider about how to take care of your IV insertion site. Make sure you: ? Wash your hands with soap and water before you change your bandage (dressing). If soap and water are not available, use hand sanitizer. ? Change your dressing as told by your health care provider.  Check your IV insertion site every day for signs of infection. Check for: ? More redness, swelling, or pain. ? More fluid or blood. ? Warmth. ? Pus or a bad smell. Contact a health care provider if:  You have more redness, swelling, or pain around the IV insertion site.  You have more fluid or blood coming from the IV insertion site.  Your IV insertion site feels warm to the touch.  You have pus or a bad smell coming from the IV insertion site.  Your urine turns pink, red, or brown.  You feel weak after doing your normal activities. Get help right away if:  You have signs of a serious allergic or immune system reaction, including: ? Itchiness. ? Hives. ? Trouble breathing. ? Anxiety. ? Chest or lower back pain. ? Fever, flushing, and chills. ? Rapid pulse. ? Rash. ? Diarrhea. ? Vomiting. ? Dark urine. ? Serious headache. ? Dizziness. ? Stiff neck. ? Yellow coloration of the face or the white parts of the eyes (jaundice). This information is not  intended to replace advice given to you by your health care provider. Make sure you discuss any questions you have with your health care provider. Document Released: 07/31/2014 Document Revised: 05/07/2017 Document Reviewed: 01/24/2016 Elsevier Patient Education  2020 [employer]  

## 2019-02-28 NOTE — Progress Notes (Signed)
Per Dr. Benay Spice: needs CBC and blood bank sample on Monday. Orders placed and high priority scheduling message sent.

## 2019-02-28 NOTE — Discharge Summary (Signed)
. Physician Discharge Summary  Meagan Walsh [facility] VZD:[id number] DOB: May 22, 1961 DOA: 02/27/2019  PCP: Lavone Orn, MD  Admit date: 02/27/2019 Discharge date: 02/28/2019  Admitted From: Home Disposition:  Discharged to home.  Recommendations for Outpatient Follow-up:  1. Follow up with PCP in 1 week. 2. 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  Patient was treated with 2-day course of IVIG on 02/25/2019 and 02/26/2019.  Patient developed severe frontal and bitemporal headache this morning, severe and persistent.  There is associated chills, but no fever.  Patient prefers dark rooms, but will not readily endorse photophobia.  There is associated nausea and vomiting, no clear neck pain or stiffness, no chest pain, no shortness of breath, no change in bowel habits and no urinary symptoms.  Patient denies prior history of migraine.  ER provider has discussed case with hematology/oncology team, Dr. Betsy Coder and the neurology team.  Hospitalist team has been asked to admit patient for supportive care, and concerns for possible aseptic meningitis versus new onset migraine.  Discharge Diagnoses:  Active Problems:   CLL (chronic lymphocytic leukemia) (HCC)   Headache   Pancytopenia (HCC)   Hypothyroidism  Headache, frontal and bitemporal:     - likely migraine.     - Place patient in observation.     - Supportive care.     - Trial of antimigraine medication; she has responded well     - We defer the decision to proceed with lumbar puncture to neurology and heme-onc (platelet is 11)     - she's greatly improved per Onco eval compared to last night; per Onco, believe this was rxn to IVIG and no longer needs neuro w/u;  she is supposed to have infusion center session today; he will call neuro if he feels she needs w/u at a later time  CLL/pancytopenia:     - Hematology is managing.     - See note documented by Dr. Betsy Coder.     - scheduled for infusion center today  Hypothyroidism:     - Continue levothyroxine  Discharge Instructions   Allergies as of 02/28/2019   No Known Allergies     Medication List    TAKE these medications   acetaminophen 325 MG tablet Commonly known as: TYLENOL Take 650 mg by mouth every 6 (six) hours as needed.   ALPRAZolam 0.25 MG tablet Commonly known as: XANAX TAKE 1 TO 2 TABLETS BY MOUTH EVERY DAY AS NEEDED FOR ANXIETY What changed:   how much to take  how to take this  when to take this  reasons to take this  additional instructions   eltrombopag 50 MG tablet Commonly known as: PROMACTA Take 2 tablets (100 mg total) by mouth daily. Take on an empty stomach 1 hour before a meal or 2 hours after What changed: how much to take   fluconazole 100 MG tablet Commonly known as: DIFLUCAN Take 1 tablet (100 mg total) by mouth daily.   folic acid 1 MG tablet Commonly known as: FOLVITE Take 1 tablet (1 mg total) by mouth daily.   Ibuprofen 200 MG Caps Take 400 mg by mouth as needed (headache).   levofloxacin 500 MG tablet Commonly known as: Levaquin Take 1 tablet (500 mg total)  by mouth daily.   levonorgestrel 20 MCG/24HR IUD Commonly known as: MIRENA 1 each by Intrauterine route once.   levothyroxine 100 MCG tablet Commonly known as: SYNTHROID Take 100 mcg by mouth daily.   oxyCODONE-acetaminophen 5-325 MG tablet Commonly known as: PERCOCET/ROXICET Take 1 tablet by mouth every 4 (four) hours as needed for moderate pain or severe pain.   prochlorperazine 5 MG tablet Commonly known as: COMPAZINE Take 1 tablet (5 mg total) by mouth every 6 (six) hours as needed for up to 3 days for nausea or vomiting.   valACYclovir 500 MG  tablet Commonly known as: VALTREX TAKE 1 TABLET BY MOUTH TWICE A DAY   vortioxetine HBr 20 MG Tabs tablet Commonly known as: Trintellix Take 1 tablet (20 mg total) by mouth daily.   zolpidem 10 MG tablet Commonly known as: AMBIEN TAKE 1/2 TO 1 TABLET BY MOUTH AS NEEDED FOR INSOMNIA. What changed: See the new instructions.       No Known Allergies  Consultations:  Oncology   Procedures/Studies: Ct Head Wo Contrast  Result Date: 02/27/2019 CLINICAL DATA:  Headache with nausea vomiting EXAM: CT HEAD WITHOUT CONTRAST TECHNIQUE: Contiguous axial images were obtained from the base of the skull through the vertex without intravenous contrast. COMPARISON:  None. FINDINGS: Brain: No acute territorial infarction, hemorrhage, or intracranial mass. Enlarged extra-axial CSF spaces anteriorly and at the cranial vertex. Ventricles are nonenlarged. Vascular: No hyperdense vessels.  No unexpected calcification Skull: Normal. Negative for fracture or focal lesion. Sinuses/Orbits: No acute finding. Other: None IMPRESSION: 1. No CT evidence for acute intracranial abnormality. 2. Dilated extra-axial CSF spaces anteriorly and at the cranial vertex, either due to cortical atrophy or possible chronic subdural effusions. Electronically Signed   By: Donavan Foil M.D.   On: 02/27/2019 19:42      Subjective: "I thought I was going home."  Discharge Exam: Vitals:   02/28/19 0730 02/28/19 0800  BP: 128/74 125/77  Pulse: 82 85  Resp:  16  Temp:    SpO2: 97% 98%   Vitals:   02/28/19 0630 02/28/19 0700 02/28/19 0730 02/28/19 0800  BP: 130/66 125/68 128/74 125/77  Pulse: 79 92 82 85  Resp:  16  16  Temp:      TempSrc:      SpO2: 98% 95% 97% 98%    General: 58 y.o. female resting in bed in NAD Eyes: PERRL, normal sclera ENMT: Nares patent w/o discharge, orophaynx clear, dentition normal, ears w/o discharge/lesions/ulcers Cardiovascular: RRR, +S1, S2, no m/g/r, equal pulses  throughout Respiratory: CTABL, no w/r/r, normal WOB GI: BS+, NDNT, no masses noted, no organomegaly noted MSK: No e/c/c Neuro: A&O x 3, no focal deficits Psyc: Appropriate interaction and affect, calm/cooperative     The results of significant diagnostics from this hospitalization (including imaging, microbiology, ancillary and laboratory) are listed below for reference.     Microbiology: Recent Results (from the past 240 hour(s))  SARS Coronavirus 2 [facility] order, Performed in [facility] hospital lab) Nasopharyngeal Nasopharyngeal Swab     Status: None   Collection Time: 02/27/19  8:58 PM   Specimen: Nasopharyngeal Swab  Result Value Ref Range Status   SARS Coronavirus 2 NEGATIVE NEGATIVE Final    Comment: (NOTE) If result is NEGATIVE SARS-CoV-2 target nucleic acids are NOT DETECTED. The SARS-CoV-2 RNA is generally detectable in upper and lower  respiratory specimens during the acute phase of infection. The lowest  concentration of SARS-CoV-2 viral copies this assay can detect is 250  copies / mL. A negative result does not preclude SARS-CoV-2 infection  and should not be used as the sole basis for treatment or other  patient management decisions.  A negative result may occur with  improper specimen collection / handling, submission of specimen other  than nasopharyngeal swab, presence of viral mutation(s) within the  areas targeted by this assay, and inadequate number of viral copies  (<250 copies / mL). A negative result must be combined with clinical  observations, patient history, and epidemiological information. If result is POSITIVE SARS-CoV-2 target nucleic acids are DETECTED. The SARS-CoV-2 RNA is generally detectable in upper and lower  respiratory specimens dur ing the acute phase of infection.  Positive  results are indicative of active infection with SARS-CoV-2.  Clinical  correlation with patient history and other diagnostic information is  necessary to  determine patient infection status.  Positive results do  not rule out bacterial infection or co-infection with other viruses. If result is PRESUMPTIVE POSTIVE SARS-CoV-2 nucleic acids MAY BE PRESENT.   A presumptive positive result was obtained on the submitted specimen  and confirmed on repeat testing.  While 2019 novel coronavirus  (SARS-CoV-2) nucleic acids may be present in the submitted sample  additional confirmatory testing may be necessary for epidemiological  and / or clinical management purposes  to differentiate between  SARS-CoV-2 and other Sarbecovirus currently known to infect humans.  If clinically indicated additional testing with an alternate test  methodology [phone]) is advised. The SARS-CoV-2 RNA is generally  detectable in upper and lower respiratory sp ecimens during the acute  phase of infection. The expected result is Negative. Fact Sheet for Patients:  StrictlyIdeas.no Fact Sheet for Healthcare Providers: BankingDealers.co.za This test is not yet approved or cleared by the [place] FDA and has been authorized for detection and/or diagnosis of SARS-CoV-2 by FDA under an Emergency Use Authorization (EUA).  This EUA will remain in effect (meaning this test can be used) for the duration of the COVID-19 declaration under Section 564(b)(1) of the Act, 21 U.S.C. section 360bbb-3(b)(1), unless the authorization is terminated or revoked sooner. Performed at [facility], [place] [address], [place], [place] [address]      Labs: BNP (last 3 results) No results for input(s): BNP in the last 8760 hours. Basic Metabolic Panel: Recent Labs  Lab 02/24/19 1353 02/27/19 1913 02/28/19 0419  NA 142 139 137  K 4.1 3.6 3.7  CL 109 109 107  CO2 26 23 24   GLUCOSE 109* 137* 112*  BUN 26* 26* 17  CREATININE 0.82 0.93 0.88  CALCIUM 9.2 8.9 8.7*  MG  --   --  2.0  PHOS  --   --  3.5   Liver Function  Tests: Recent Labs  Lab 02/24/19 1353 02/27/19 1913  AST 17 23  ALT 18 23  ALKPHOS 87 67  BILITOT 0.5 0.3  PROT 5.9* 8.5*  ALBUMIN 4.0 3.6   Recent Labs  Lab 02/27/19 1913  LIPASE 31   No results for input(s): AMMONIA in the last 168 hours. CBC: Recent Labs  Lab 02/21/19 0945 02/21/19 1550 02/24/19 1353 02/26/19 0937 02/27/19 1913 02/28/19 0419  WBC 3.0*  --  1.8* 1.5* 1.0* 1.4*  NEUTROABS 0.7*  --  0.5* 0.2* 0.2*  --   HGB 8.5*  --  8.5* 7.9* 7.0* 6.7*  HCT 24.7*  --  24.8* 23.2* 21.1* 20.1*  MCV 86.7  --  87.9 86.6 88.7 88.9  PLT 5* 28* 10*  24* 11* 10*   Cardiac Enzymes: No results for input(s): CKTOTAL, CKMB, CKMBINDEX, TROPONINI in the last 168 hours. BNP: Invalid input(s): POCBNP CBG: No results for input(s): GLUCAP in the last 168 hours. D-Dimer No results for input(s): DDIMER in the last 72 hours. Hgb A1c No results for input(s): HGBA1C in the last 72 hours. Lipid Profile No results for input(s): CHOL, HDL, LDLCALC, TRIG, CHOLHDL, LDLDIRECT in the last 72 hours. Thyroid function studies No results for input(s): TSH, T4TOTAL, T3FREE, THYROIDAB in the last 72 hours.  Invalid input(s): FREET3 Anemia work up No results for input(s): VITAMINB12, FOLATE, FERRITIN, TIBC, IRON, RETICCTPCT in the last 72 hours. Urinalysis No results found for: COLORURINE, APPEARANCEUR, LABSPEC, [place], GLUCOSEU, [place], [place], KETONESUR, PROTEINUR, UROBILINOGEN, NITRITE, LEUKOCYTESUR Sepsis Labs Invalid input(s): PROCALCITONIN,  WBC,  [place] Microbiology Recent Results (from the past 240 hour(s))  SARS Coronavirus 2 [facility] order, Performed in [facility] hospital lab) Nasopharyngeal Nasopharyngeal Swab     Status: None   Collection Time: 02/27/19  8:58 PM   Specimen: Nasopharyngeal Swab  Result Value Ref Range Status   SARS Coronavirus 2 NEGATIVE NEGATIVE Final    Comment: (NOTE) If result is NEGATIVE SARS-CoV-2 target nucleic acids are NOT DETECTED. The  SARS-CoV-2 RNA is generally detectable in upper and lower  respiratory specimens during the acute phase of infection. The lowest  concentration of SARS-CoV-2 viral copies this assay can detect is 250  copies / mL. A negative result does not preclude SARS-CoV-2 infection  and should not be used as the sole basis for treatment or other  patient management decisions.  A negative result may occur with  improper specimen collection / handling, submission of specimen other  than nasopharyngeal swab, presence of viral mutation(s) within the  areas targeted by this assay, and inadequate number of viral copies  (<250 copies / mL). A negative result must be combined with clinical  observations, patient history, and epidemiological information. If result is POSITIVE SARS-CoV-2 target nucleic acids are DETECTED. The SARS-CoV-2 RNA is generally detectable in upper and lower  respiratory specimens dur ing the acute phase of infection.  Positive  results are indicative of active infection with SARS-CoV-2.  Clinical  correlation with patient history and other diagnostic information is  necessary to determine patient infection status.  Positive results do  not rule out bacterial infection or co-infection with other viruses. If result is PRESUMPTIVE POSTIVE SARS-CoV-2 nucleic acids MAY BE PRESENT.   A presumptive positive result was obtained on the submitted specimen  and confirmed on repeat testing.  While 2019 novel coronavirus  (SARS-CoV-2) nucleic acids may be present in the submitted sample  additional confirmatory testing may be necessary for epidemiological  and / or clinical management purposes  to differentiate between  SARS-CoV-2 and other Sarbecovirus currently known to infect humans.  If clinically indicated additional testing with an alternate test  methodology [phone]) is advised. The SARS-CoV-2 RNA is generally  detectable in upper and lower respiratory sp ecimens during the acute   phase of infection. The expected result is Negative. Fact Sheet for Patients:  StrictlyIdeas.no Fact Sheet for Healthcare Providers: BankingDealers.co.za This test is not yet approved or cleared by the [place] FDA and has been authorized for detection and/or diagnosis of SARS-CoV-2 by FDA under an Emergency Use Authorization (EUA).  This EUA will remain in effect (meaning this test can be used) for the duration of the COVID-19 declaration under Section 564(b)(1) of the Act, 21 U.S.C. section 360bbb-3(b)(1), unless  the authorization is terminated or revoked sooner. Performed at [facility], [place] [address], [place], [place] [address]      Time coordinating discharge: 25 minutes  SIGNED:   Jonnie Finner, DO  [facility] 02/28/2019, 8:36 AM Pager   If 7PM-7AM, please contact night-coverage [email or web address] Password [email or web address]

## 2019-02-28 NOTE — Progress Notes (Signed)
Per Dr. Benay Spice: Patient needs to resume Levaquin and Fluconazole. Scripts sent to pharmacy. Awaiting charge nurse to provide earlier time for transfusions today if possible

## 2019-02-28 NOTE — ED Notes (Signed)
Meagan Walsh would like to be updated. He is pt's husband. [phone]

## 2019-02-28 NOTE — Progress Notes (Signed)
Meagan Walsh has a history of CLL and is followed by Dr. Dominica Severin B. Sherrill.  She received IVIG this week.  She developed a migraine and was seen in the emergency room yesterday.  It was believed that her migraines were secondary to dosing with IVIG.  She has not had any thing for headaches since midnight last night.  She previously had nausea and vomiting with IVIG earlier this week.  She was given Compazine 10 mg IV x1 which was followed by 1 Percocet today.  Sandi Mealy, MHS, PA-C Physician Assistant

## 2019-02-28 NOTE — Telephone Encounter (Signed)
Called and spoke with patient. Confirmed 8/12 appt

## 2019-02-28 NOTE — Progress Notes (Signed)
IP PROGRESS NOTE  Subjective:   Meagan Walsh reports feeling much better.  No nausea.  The headache is much improved.  She reports relief of the headache with Imitrex and Dilaudid during the night.  No emesis since approximately 6 PM last night.  No fever.  She would like to go home.  Objective: Vital signs in last 24 hours: Blood pressure 120/69, pulse 75, temperature 99 F (37.2 C), temperature source Oral, resp. rate 16, SpO2 98 %.  Intake/Output from previous day: 08/06 0701 - 08/07 0700 In: 2050 [IV Piggyback:2050] Out: -   Physical Exam:  HEENT: Oral cavity without bleeding or thrush Abdomen: Soft and nontender Extremities: No leg edema Neurologic: Alert and oriented.  Musculoskeletal: No neck or spine tenderness, the neck is supple Skin: Resolving ecchymoses over the extremities  Portacath/PICC-without erythema   Lab Results: Recent Labs    02/27/19 1913 02/28/19 0419  WBC 1.0* 1.4*  HGB 7.0* 6.7*  HCT 21.1* 20.1*  PLT 11* 10*   ANC 0.2 reviewed the patient's current medications.  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 Mild splenomegaly.  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, no evidence of 11q22deletion  Bone marrow biopsy 02/26/2018-hypercellular marrow with extensive involvement by CLL, lymphocytes represent85% of all cells  Ibrutinib initiated 04/03/2018  Ibrutinib placed on hold 04/11/2018 due to onset of arthralgias  Ibrutinib resumed 04/16/2018, discontinued 04/25/2018 secondary to severe arthralgias/arthritis  Ibrutinib resumed at a dose of 140 mg daily 05/03/2018  Ibrutinib dose adjusted to 140 mg alternating with 218m 06/25/2018  Ibrutinib discontinued 07/03/2018 secondary to severe arthralgias  Acalabrutinib 08/16/2018, discontinued 11/15/2018 secondary to persistent severe transfusion dependent anemia and neutropenia/thrombocytopenia, last dose 11/14/2018  Bone marrow biopsy 11/21/2018-decreased cellularity, involvement by CLL, decreased erythroid and granulocytic precursors, decreased megakaryocytes  Cycle 1 rituximab 12/06/2018  Bone marrow biopsy 12/24/2018 at [facility]-hypocellular bone marrow (10%) involved by CLL, representing 50% of marrow cellularity; markedly decreased trilineage hematopoiesis including essentially absent erythropoiesis  2.Hypothyroidism 3.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 Augmentin prescribed. 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 Pancytopenia secondary to CLL and a hypocellular bone marrow  G-CSF and Nplate started 52/87/6811 G-CSF changed to daily beginning 12/17/2018; G-CSF discontinued 12/31/2018, last Nplate 01/15/2019  Began prednisone 60 mg  daily 01/11/19, tolerating moderately well except jitteriness, irritability, and difficulty sleeping. Tapered to 40 mg daily x1 week starting 01/24/19, reduce by 10 mg each week until discontinued.  Prednisone discontinued 02/20/2019.  Promacta started 01/20/2019, dose increased to 100 mg daily 02/04/2019; dose increased to 150 mg daily 02/21/2019  IVIG daily for 2 days beginning 02/25/2019  9.  Severe headache and nausea/vomiting 02/27/2019-likely related to IVIG therapy, much improved on the morning of 02/28/2019   Meagan Walsh appears much improved this morning.  The headache is rated at a 2/10 and she no longer has nausea.  No fever.  I suspect her symptoms yesterday were related to toxicity from the IVIG.  I have a low clinical suspicion for an acute infectious process.  She appears stable for discharge from the emergency room.  She is scheduled for transfusion with packed red blood cells and platelets at the Cancer center today.. She has persistent severe pancytopenia.  I will discuss the case with Dr. Prince Solian at [facility] regarding the timing of a bone marrow biopsy and initiation of immunosuppressive therapy. Recommendations: 1.  Discharge from the emergency room with close outpatient follow-up 2.  Packed red blood cells and platelets at the Cancer center today 3.  Antimigraine and trial of prednisone for a recurrent headache 4.  Resume Levaquin and Diflucan prophylaxis      LOS: 0 days   Betsy Coder, MD   02/28/2019, 9:07 AM

## 2019-02-28 NOTE — ED Notes (Signed)
Date and time results received: 02/28/19 0452 (use smartphrase ".now" to insert current time)  Test: Hbg Critical Value: 6.7  Name of Provider Notified: [place]  Orders Received? Or Actions Taken?: Orders Received - See Orders for details

## 2019-02-28 NOTE — Progress Notes (Signed)
Called patient and left VM to come to [facility] as soon as she can. Infusion is working her in early for transfusions. Requested return call to confirm.

## 2019-03-01 LAB — PREPARE PLATELET PHERESIS: Unit division: 0

## 2019-03-01 LAB — TYPE AND SCREEN
ABO/RH(D): O POS
Antibody Screen: NEGATIVE
Unit division: 0
Unit division: 0

## 2019-03-01 LAB — BPAM RBC
Blood Product Expiration Date: 202008302359
Blood Product Expiration Date: 202008302359
ISSUE DATE / TIME: 202008070959
ISSUE DATE / TIME: 202008070959
Unit Type and Rh: 5100
Unit Type and Rh: 5100

## 2019-03-01 LAB — BPAM PLATELET PHERESIS
Blood Product Expiration Date: 202008082359
ISSUE DATE / TIME: 202008071012
Unit Type and Rh: 5100

## 2019-03-02 ENCOUNTER — Other Ambulatory Visit: Payer: Self-pay | Admitting: Psychiatry

## 2019-03-03 ENCOUNTER — Other Ambulatory Visit: Payer: Self-pay | Admitting: Emergency Medicine

## 2019-03-03 ENCOUNTER — Inpatient Hospital Stay: Payer: BC Managed Care – PPO

## 2019-03-03 ENCOUNTER — Telehealth: Payer: Self-pay | Admitting: *Deleted

## 2019-03-03 ENCOUNTER — Other Ambulatory Visit: Payer: Self-pay

## 2019-03-03 ENCOUNTER — Other Ambulatory Visit: Payer: Self-pay | Admitting: *Deleted

## 2019-03-03 DIAGNOSIS — Z85820 Personal history of malignant melanoma of skin: Secondary | ICD-10-CM | POA: Diagnosis not present

## 2019-03-03 DIAGNOSIS — C9110 Chronic lymphocytic leukemia of B-cell type not having achieved remission: Secondary | ICD-10-CM

## 2019-03-03 DIAGNOSIS — D61818 Other pancytopenia: Secondary | ICD-10-CM | POA: Diagnosis not present

## 2019-03-03 DIAGNOSIS — D696 Thrombocytopenia, unspecified: Secondary | ICD-10-CM

## 2019-03-03 DIAGNOSIS — Z79899 Other long term (current) drug therapy: Secondary | ICD-10-CM | POA: Diagnosis not present

## 2019-03-03 DIAGNOSIS — Z791 Long term (current) use of non-steroidal anti-inflammatories (NSAID): Secondary | ICD-10-CM | POA: Diagnosis not present

## 2019-03-03 DIAGNOSIS — E039 Hypothyroidism, unspecified: Secondary | ICD-10-CM | POA: Diagnosis not present

## 2019-03-03 DIAGNOSIS — F419 Anxiety disorder, unspecified: Secondary | ICD-10-CM | POA: Diagnosis not present

## 2019-03-03 DIAGNOSIS — Z95828 Presence of other vascular implants and grafts: Secondary | ICD-10-CM

## 2019-03-03 DIAGNOSIS — D801 Nonfamilial hypogammaglobulinemia: Secondary | ICD-10-CM | POA: Diagnosis not present

## 2019-03-03 LAB — CBC WITH DIFFERENTIAL (CANCER CENTER ONLY)
Abs Immature Granulocytes: 0.01 10*3/uL (ref 0.00–0.07)
Basophils Absolute: 0 10*3/uL (ref 0.0–0.1)
Basophils Relative: 0 %
Eosinophils Absolute: 0 10*3/uL (ref 0.0–0.5)
Eosinophils Relative: 0 %
HCT: 27.5 % — ABNORMAL LOW (ref 36.0–46.0)
Hemoglobin: 9.4 g/dL — ABNORMAL LOW (ref 12.0–15.0)
Immature Granulocytes: 1 %
Lymphocytes Relative: 84 %
Lymphs Abs: 0.9 10*3/uL (ref 0.7–4.0)
MCH: 28.4 pg (ref 26.0–34.0)
MCHC: 34.2 g/dL (ref 30.0–36.0)
MCV: 83.1 fL (ref 80.0–100.0)
Monocytes Absolute: 0 10*3/uL — ABNORMAL LOW (ref 0.1–1.0)
Monocytes Relative: 3 %
Neutro Abs: 0.1 10*3/uL — CL (ref 1.7–7.7)
Neutrophils Relative %: 12 %
Platelet Count: 6 10*3/uL — CL (ref 150–400)
RBC: 3.31 MIL/uL — ABNORMAL LOW (ref 3.87–5.11)
RDW: 13.2 % (ref 11.5–15.5)
WBC Count: 1.1 10*3/uL — ABNORMAL LOW (ref 4.0–10.5)
nRBC: 0 % (ref 0.0–0.2)

## 2019-03-03 LAB — SAMPLE TO BLOOD BANK

## 2019-03-03 MED ORDER — HEPARIN SOD (PORK) LOCK FLUSH 100 UNIT/ML IV SOLN
250.0000 [IU] | INTRAVENOUS | Status: DC | PRN
  Filled 2019-03-03: qty 5

## 2019-03-03 MED ORDER — HEPARIN SOD (PORK) LOCK FLUSH 100 UNIT/ML IV SOLN
500.0000 [IU] | Freq: Once | INTRAVENOUS | Status: AC | PRN
  Administered 2019-03-03: 250 [IU]
  Filled 2019-03-03: qty 5

## 2019-03-03 MED ORDER — SODIUM CHLORIDE 0.9% FLUSH
3.0000 mL | INTRAVENOUS | Status: AC | PRN
  Administered 2019-03-03: 3 mL
  Filled 2019-03-03: qty 10

## 2019-03-03 MED ORDER — SODIUM CHLORIDE 0.9% FLUSH
10.0000 mL | INTRAVENOUS | Status: DC | PRN
  Filled 2019-03-03: qty 10

## 2019-03-03 MED ORDER — SODIUM CHLORIDE 0.9% IV SOLUTION
250.0000 mL | Freq: Once | INTRAVENOUS | Status: AC
  Administered 2019-03-03: 250 mL via INTRAVENOUS
  Filled 2019-03-03: qty 250

## 2019-03-03 MED ORDER — HEPARIN SOD (PORK) LOCK FLUSH 100 UNIT/ML IV SOLN
250.0000 [IU] | INTRAVENOUS | Status: AC | PRN
  Administered 2019-03-03: 250 [IU]
  Filled 2019-03-03: qty 5

## 2019-03-03 MED ORDER — SODIUM CHLORIDE 0.9% FLUSH
10.0000 mL | INTRAVENOUS | Status: DC | PRN
  Administered 2019-03-03: 10 mL
  Filled 2019-03-03: qty 10

## 2019-03-03 NOTE — Patient Instructions (Signed)
Platelet Transfusion A platelet transfusion is a procedure in which you receive donated platelets through an IV. Platelets are tiny pieces of blood cells. When you get an injury, platelets clump together in the area to form a blood clot. This helps stop bleeding and is the beginning of the healing process. If you have too few platelets, your blood may have trouble clotting. This may cause you to bleed and bruise very easily. You may need a platelet transfusion if you have a condition that causes a low number of platelets (thrombocytopenia). A platelet transfusion may be used to stop or prevent excessive bleeding. Tell a health care provider about:  Any reactions you have had during previous transfusions.  Any allergies you have.  All medicines you are taking, including vitamins, herbs, eye drops, creams, and over-the-counter medicines.  Any blood disorders you have.  Any surgeries you have had.  Any medical conditions you have.  Whether you are pregnant or may be pregnant. What are the risks? Generally, this is a safe procedure. However, problems may occur, including:  Fever.  Infection.  Allergic reaction to the donor platelets.  Your body's disease-fighting system (immune system) attacking the donor platelets (hemolytic reaction). This is rare.  A rare reaction that causes lung damage (transfusion-related acute lung injury). What happens before the procedure? Medicines  Ask your health care provider about: ? Changing or stopping your regular medicines. This is especially important if you are taking diabetes medicines or blood thinners. ? Taking medicines such as aspirin and ibuprofen. These medicines can thin your blood. Do not take these medicines unless your health care provider tells you to take them. ? Taking over-the-counter medicines, vitamins, herbs, and supplements. General instructions  You will have a blood test to determine your blood type. Your blood type  determines what kind of platelets you will be given.  Follow instructions from your health care provider about eating or drinking restrictions.  If you have had an allergic reaction to a transfusion in the past, you may be given medicine to help prevent a reaction.  Your temperature, blood pressure, pulse, and breathing will be monitored. What happens during the procedure?   An IV will be inserted into one of your veins.  For your safety, two health care providers will verify your identity along with the donor platelets about to be infused.  A bag of donor platelets will be connected to your IV. The platelets will flow into your bloodstream. This usually takes 30-60 minutes.  Your temperature, blood pressure, pulse, and breathing will be monitored during the transfusion. This helps detect early signs of any reaction.  You will also be monitored for other symptoms that may indicate a reaction, including chills, hives, or itching.  If you have signs of a reaction at any time, your transfusion will be stopped, and you may be given medicine to help manage the reaction.  When your transfusion is complete, your IV will be removed.  Pressure may be applied to the IV site for a few minutes to stop any bleeding.  The IV site will be covered with a bandage (dressing). The procedure may vary among health care providers and hospitals. What happens after the procedure?  Your blood pressure, temperature, pulse, and breathing will be monitored until you leave the hospital or clinic.  You may have some bruising and soreness at your IV site. Follow these instructions at home: Medicines  Take over-the-counter and prescription medicines only as told by your health care provider.  Talk with your health care provider before you take any medicines that contain aspirin or NSAIDs. These medicines increase your risk for dangerous bleeding. General instructions  Change or remove your dressing as told  by your health care provider.  Return to your normal activities as told by your health care provider. Ask your health care provider what activities are safe for you.  Do not take baths, swim, or use a hot tub until your health care provider approves. Ask your health care provider if you may take showers.  Check your IV site every day for signs of infection. Check for: ? Redness, swelling, or pain. ? Fluid or blood. If fluid or blood drains from your IV site, use your hands to press down firmly on a bandage covering the area for a minute or two. Doing this should stop the bleeding. ? Warmth. ? Pus or a bad smell.  Keep all follow-up visits as told by your health care provider. This is important. Contact a health care provider if you have:  A headache that does not go away with medicine.  Hives, rash, or itchy skin.  Nausea or vomiting.  Unusual tiredness or weakness.  Signs of infection at your IV site. Get help right away if:  You have a fever or chills.  You urinate less often than usual.  Your urine is darker colored than normal.  You have any of the following: ? Trouble breathing. ? Pain in your back, abdomen, or chest. ? Cool, clammy skin. ? A fast heartbeat. Summary  Platelets are tiny pieces of blood cells that clump together to form a blood clot when you have an injury. If you have too few platelets, your blood may have trouble clotting.  A platelet transfusion is a procedure in which you receive donated platelets through an IV.  A platelet transfusion may be used to stop or prevent excessive bleeding.  After the procedure, check your IV site every day for signs of infection, including redness, swelling, pain, or warmth. This information is not intended to replace advice given to you by your health care provider. Make sure you discuss any questions you have with your health care provider. Document Released: 05/07/2007 Document Revised: 08/15/2017 Document  Reviewed: 08/15/2017 Elsevier Patient Education  2020 [place].    Blood Transfusion, Adult  A blood transfusion is a procedure in which you receive donated blood, including plasma, platelets, and red blood cells, through an IV tube. You may need a blood transfusion because of illness, surgery, or injury. The blood may come from a donor. You may also be able to donate blood for yourself (autologous blood donation) before a surgery if you know that you might require a blood transfusion. The blood given in a transfusion is made up of different types of cells. You may receive:  Red blood cells. These carry oxygen to the cells in the body.  White blood cells. These help you fight infections.  Platelets. These help your blood to clot.  Plasma. This is the liquid part of your blood and it helps with fluid imbalances. If you have hemophilia or another clotting disorder, you may also receive other types of blood products. Tell a health care provider about:  Any allergies you have.  All medicines you are taking, including vitamins, herbs, eye drops, creams, and over-the-counter medicines.  Any problems you or family members have had with anesthetic medicines.  Any blood disorders you have.  Any surgeries you have had.  Any medical  conditions you have, including any recent fever or cold symptoms.  Whether you are pregnant or may be pregnant.  Any previous reactions you have had during a blood transfusion. What are the risks? Generally, this is a safe procedure. However, problems may occur, including:  Having an allergic reaction to something in the donated blood. Hives and itching may be symptoms of this type of reaction.  Fever. This may be a reaction to the white blood cells in the transfused blood. Nausea or chest pain may accompany a fever.  Iron overload. This can happen from having many transfusions.  Transfusion-related acute lung injury (TRALI). This is a rare reaction  that causes lung damage. The cause is not known.TRALI can occur within hours of a transfusion or several days later.  Sudden (acute) or delayed hemolytic reactions. This happens if your blood does not match the cells in your transfusion. Your body's defense system (immune system) may try to attack the new cells. This complication is rare. The symptoms include fever, chills, nausea, and low back pain or chest pain.  Infection or disease transmission. This is rare. What happens before the procedure?  You will have a blood test to determine your blood type. This is necessary to know what kind of blood your body will accept and to match it to the donor blood.  If you are going to have a planned surgery, you may be able to do an autologous blood donation. This may be done in case you need to have a transfusion.  If you have had an allergic reaction to a transfusion in the past, you may be given medicine to help prevent a reaction. This medicine may be given to you by mouth or through an IV tube.  You will have your temperature, blood pressure, and pulse monitored before the transfusion.  Follow instructions from your health care provider about eating and drinking restrictions.  Ask your health care provider about: ? Changing or stopping your regular medicines. This is especially important if you are taking diabetes medicines or blood thinners. ? Taking medicines such as aspirin and ibuprofen. These medicines can thin your blood. Do not take these medicines before your procedure if your health care provider instructs you not to. What happens during the procedure?  An IV tube will be inserted into one of your veins.  The bag of donated blood will be attached to your IV tube. The blood will then enter through your vein.  Your temperature, blood pressure, and pulse will be monitored regularly during the transfusion. This monitoring is done to detect early signs of a transfusion reaction.  If you  have any signs or symptoms of a reaction, your transfusion will be stopped and you may be given medicine.  When the transfusion is complete, your IV tube will be removed.  Pressure may be applied to the IV site for a few minutes.  A bandage (dressing) will be applied. The procedure may vary among health care providers and hospitals. What happens after the procedure?  Your temperature, blood pressure, heart rate, breathing rate, and blood oxygen level will be monitored often.  Your blood may be tested to see how you are responding to the transfusion.  You may be warmed with fluids or blankets to maintain a normal body temperature. Summary  A blood transfusion is a procedure in which you receive donated blood, including plasma, platelets, and red blood cells, through an IV tube.  Your temperature, blood pressure, and pulse will be monitored  before, during, and after the transfusion.  Your blood may be tested after the transfusion to see how your body has responded. This information is not intended to replace advice given to you by your health care provider. Make sure you discuss any questions you have with your health care provider. Document Released: 07/07/2000 Document Revised: 05/27/2018 Document Reviewed: 04/06/2016 Elsevier Patient Education  2020 [employer].

## 2019-03-03 NOTE — Progress Notes (Signed)
Blood bank able to locate unit of platelets today and they have been irradiated. Ready to transfuse. Confirmed they see the prepare order. Will transfuse today in [facility]. Per Dr. Benay Spice: Needs crossmatched platelets on Wednesday. Blood bank will use tube sent over today to process this. Will place orders in Epic on Wendnesday.

## 2019-03-03 NOTE — Telephone Encounter (Signed)
error 

## 2019-03-04 LAB — BPAM PLATELET PHERESIS
Blood Product Expiration Date: 202008132359
ISSUE DATE / TIME: 202008101527
Unit Type and Rh: 5100

## 2019-03-04 LAB — PREPARE PLATELET PHERESIS: Unit division: 0

## 2019-03-05 ENCOUNTER — Inpatient Hospital Stay: Payer: BC Managed Care – PPO

## 2019-03-05 ENCOUNTER — Telehealth: Payer: Self-pay | Admitting: *Deleted

## 2019-03-05 ENCOUNTER — Other Ambulatory Visit: Payer: Self-pay | Admitting: *Deleted

## 2019-03-05 ENCOUNTER — Other Ambulatory Visit: Payer: Self-pay

## 2019-03-05 ENCOUNTER — Other Ambulatory Visit: Payer: Self-pay | Admitting: Oncology

## 2019-03-05 ENCOUNTER — Inpatient Hospital Stay (HOSPITAL_BASED_OUTPATIENT_CLINIC_OR_DEPARTMENT_OTHER): Payer: BC Managed Care – PPO | Admitting: Oncology

## 2019-03-05 VITALS — BP 129/75 | HR 98 | Temp 98.3°F | Resp 16 | Ht 66.0 in | Wt 139.0 lb

## 2019-03-05 DIAGNOSIS — Z79899 Other long term (current) drug therapy: Secondary | ICD-10-CM | POA: Diagnosis not present

## 2019-03-05 DIAGNOSIS — C9110 Chronic lymphocytic leukemia of B-cell type not having achieved remission: Secondary | ICD-10-CM

## 2019-03-05 DIAGNOSIS — D696 Thrombocytopenia, unspecified: Secondary | ICD-10-CM

## 2019-03-05 DIAGNOSIS — D61818 Other pancytopenia: Secondary | ICD-10-CM | POA: Diagnosis not present

## 2019-03-05 DIAGNOSIS — F419 Anxiety disorder, unspecified: Secondary | ICD-10-CM | POA: Diagnosis not present

## 2019-03-05 DIAGNOSIS — Z95828 Presence of other vascular implants and grafts: Secondary | ICD-10-CM

## 2019-03-05 DIAGNOSIS — Z85820 Personal history of malignant melanoma of skin: Secondary | ICD-10-CM | POA: Diagnosis not present

## 2019-03-05 DIAGNOSIS — E039 Hypothyroidism, unspecified: Secondary | ICD-10-CM | POA: Diagnosis not present

## 2019-03-05 DIAGNOSIS — D801 Nonfamilial hypogammaglobulinemia: Secondary | ICD-10-CM | POA: Diagnosis not present

## 2019-03-05 DIAGNOSIS — Z791 Long term (current) use of non-steroidal anti-inflammatories (NSAID): Secondary | ICD-10-CM | POA: Diagnosis not present

## 2019-03-05 LAB — CBC WITH DIFFERENTIAL (CANCER CENTER ONLY)
Abs Immature Granulocytes: 0.03 10*3/uL (ref 0.00–0.07)
Basophils Absolute: 0 10*3/uL (ref 0.0–0.1)
Basophils Relative: 0 %
Eosinophils Absolute: 0 10*3/uL (ref 0.0–0.5)
Eosinophils Relative: 0 %
HCT: 30.8 % — ABNORMAL LOW (ref 36.0–46.0)
Hemoglobin: 10.4 g/dL — ABNORMAL LOW (ref 12.0–15.0)
Immature Granulocytes: 2 %
Lymphocytes Relative: 86 %
Lymphs Abs: 1.7 10*3/uL (ref 0.7–4.0)
MCH: 28.3 pg (ref 26.0–34.0)
MCHC: 33.8 g/dL (ref 30.0–36.0)
MCV: 83.9 fL (ref 80.0–100.0)
Monocytes Absolute: 0 10*3/uL — ABNORMAL LOW (ref 0.1–1.0)
Monocytes Relative: 2 %
Neutro Abs: 0.2 10*3/uL — CL (ref 1.7–7.7)
Neutrophils Relative %: 10 %
Platelet Count: 15 10*3/uL — ABNORMAL LOW (ref 150–400)
RBC: 3.67 MIL/uL — ABNORMAL LOW (ref 3.87–5.11)
RDW: 12.9 % (ref 11.5–15.5)
WBC Count: 2 10*3/uL — ABNORMAL LOW (ref 4.0–10.5)
nRBC: 0 % (ref 0.0–0.2)

## 2019-03-05 LAB — SAMPLE TO BLOOD BANK

## 2019-03-05 MED ORDER — SUMATRIPTAN 20 MG/ACT NA SOLN: 20.0000 mg | Freq: Every day | NASAL | 6 refills | Status: DC | PRN

## 2019-03-05 MED ORDER — SUMATRIPTAN 20 MG/ACT NA SOLN
20.0000 mg | Freq: Once | NASAL | Status: AC
  Administered 2019-03-05: 20 mg via NASAL
  Filled 2019-03-05: qty 1

## 2019-03-05 MED ORDER — SODIUM CHLORIDE 0.9% IV SOLUTION
250.0000 mL | Freq: Once | INTRAVENOUS | Status: AC
  Administered 2019-03-05: 250 mL via INTRAVENOUS
  Filled 2019-03-05: qty 250

## 2019-03-05 MED ORDER — HEPARIN SOD (PORK) LOCK FLUSH 100 UNIT/ML IV SOLN
250.0000 [IU] | INTRAVENOUS | Status: AC | PRN
  Administered 2019-03-05: 250 [IU]
  Filled 2019-03-05: qty 5

## 2019-03-05 MED ORDER — SUMATRIPTAN 20 MG/ACT NA SOLN
20.0000 mg | Freq: Once | NASAL | Status: DC
  Filled 2019-03-05: qty 1

## 2019-03-05 MED ORDER — OXYCODONE-ACETAMINOPHEN 5-325 MG PO TABS: 1.0000 | ORAL_TABLET | Freq: Three times a day (TID) | ORAL | 0 refills | Status: DC | PRN

## 2019-03-05 MED ORDER — HEPARIN SOD (PORK) LOCK FLUSH 100 UNIT/ML IV SOLN
500.0000 [IU] | Freq: Once | INTRAVENOUS | Status: AC
  Administered 2019-03-05: 500 [IU] via INTRAVENOUS
  Filled 2019-03-05: qty 5

## 2019-03-05 MED ORDER — SODIUM CHLORIDE 0.9% FLUSH
10.0000 mL | INTRAVENOUS | Status: AC | PRN
  Administered 2019-03-05: 3 mL
  Filled 2019-03-05: qty 10

## 2019-03-05 MED ORDER — SODIUM CHLORIDE 0.9% FLUSH
10.0000 mL | Freq: Once | INTRAVENOUS | Status: AC
  Administered 2019-03-05: 10 mL via INTRAVENOUS
  Filled 2019-03-05: qty 10

## 2019-03-05 NOTE — Patient Instructions (Signed)
Platelet Transfusion A platelet transfusion is a procedure in which you receive donated platelets through an IV. Platelets are tiny pieces of blood cells. When you get an injury, platelets clump together in the area to form a blood clot. This helps stop bleeding and is the beginning of the healing process. If you have too few platelets, your blood may have trouble clotting. This may cause you to bleed and bruise very easily. You may need a platelet transfusion if you have a condition that causes a low number of platelets (thrombocytopenia). A platelet transfusion may be used to stop or prevent excessive bleeding. Tell a health care provider about:  Any reactions you have had during previous transfusions.  Any allergies you have.  All medicines you are taking, including vitamins, herbs, eye drops, creams, and over-the-counter medicines.  Any blood disorders you have.  Any surgeries you have had.  Any medical conditions you have.  Whether you are pregnant or may be pregnant. What are the risks? Generally, this is a safe procedure. However, problems may occur, including:  Fever.  Infection.  Allergic reaction to the donor platelets.  Your body's disease-fighting system (immune system) attacking the donor platelets (hemolytic reaction). This is rare.  A rare reaction that causes lung damage (transfusion-related acute lung injury). What happens before the procedure? Medicines  Ask your health care provider about: ? Changing or stopping your regular medicines. This is especially important if you are taking diabetes medicines or blood thinners. ? Taking medicines such as aspirin and ibuprofen. These medicines can thin your blood. Do not take these medicines unless your health care provider tells you to take them. ? Taking over-the-counter medicines, vitamins, herbs, and supplements. General instructions  You will have a blood test to determine your blood type. Your blood type  determines what kind of platelets you will be given.  Follow instructions from your health care provider about eating or drinking restrictions.  If you have had an allergic reaction to a transfusion in the past, you may be given medicine to help prevent a reaction.  Your temperature, blood pressure, pulse, and breathing will be monitored. What happens during the procedure?   An IV will be inserted into one of your veins.  For your safety, two health care providers will verify your identity along with the donor platelets about to be infused.  A bag of donor platelets will be connected to your IV. The platelets will flow into your bloodstream. This usually takes 30-60 minutes.  Your temperature, blood pressure, pulse, and breathing will be monitored during the transfusion. This helps detect early signs of any reaction.  You will also be monitored for other symptoms that may indicate a reaction, including chills, hives, or itching.  If you have signs of a reaction at any time, your transfusion will be stopped, and you may be given medicine to help manage the reaction.  When your transfusion is complete, your IV will be removed.  Pressure may be applied to the IV site for a few minutes to stop any bleeding.  The IV site will be covered with a bandage (dressing). The procedure may vary among health care providers and hospitals. What happens after the procedure?  Your blood pressure, temperature, pulse, and breathing will be monitored until you leave the hospital or clinic.  You may have some bruising and soreness at your IV site. Follow these instructions at home: Medicines  Take over-the-counter and prescription medicines only as told by your health care provider.  Talk with your health care provider before you take any medicines that contain aspirin or NSAIDs. These medicines increase your risk for dangerous bleeding. General instructions  Change or remove your dressing as told  by your health care provider.  Return to your normal activities as told by your health care provider. Ask your health care provider what activities are safe for you.  Do not take baths, swim, or use a hot tub until your health care provider approves. Ask your health care provider if you may take showers.  Check your IV site every day for signs of infection. Check for: ? Redness, swelling, or pain. ? Fluid or blood. If fluid or blood drains from your IV site, use your hands to press down firmly on a bandage covering the area for a minute or two. Doing this should stop the bleeding. ? Warmth. ? Pus or a bad smell.  Keep all follow-up visits as told by your health care provider. This is important. Contact a health care provider if you have:  A headache that does not go away with medicine.  Hives, rash, or itchy skin.  Nausea or vomiting.  Unusual tiredness or weakness.  Signs of infection at your IV site. Get help right away if:  You have a fever or chills.  You urinate less often than usual.  Your urine is darker colored than normal.  You have any of the following: ? Trouble breathing. ? Pain in your back, abdomen, or chest. ? Cool, clammy skin. ? A fast heartbeat. Summary  Platelets are tiny pieces of blood cells that clump together to form a blood clot when you have an injury. If you have too few platelets, your blood may have trouble clotting.  A platelet transfusion is a procedure in which you receive donated platelets through an IV.  A platelet transfusion may be used to stop or prevent excessive bleeding.  After the procedure, check your IV site every day for signs of infection, including redness, swelling, pain, or warmth. This information is not intended to replace advice given to you by your health care provider. Make sure you discuss any questions you have with your health care provider. Document Released: 05/07/2007 Document Revised: 08/15/2017 Document  Reviewed: 08/15/2017 Elsevier Patient Education  2020 [place].    Blood Transfusion, Adult  A blood transfusion is a procedure in which you receive donated blood, including plasma, platelets, and red blood cells, through an IV tube. You may need a blood transfusion because of illness, surgery, or injury. The blood may come from a donor. You may also be able to donate blood for yourself (autologous blood donation) before a surgery if you know that you might require a blood transfusion. The blood given in a transfusion is made up of different types of cells. You may receive:  Red blood cells. These carry oxygen to the cells in the body.  White blood cells. These help you fight infections.  Platelets. These help your blood to clot.  Plasma. This is the liquid part of your blood and it helps with fluid imbalances. If you have hemophilia or another clotting disorder, you may also receive other types of blood products. Tell a health care provider about:  Any allergies you have.  All medicines you are taking, including vitamins, herbs, eye drops, creams, and over-the-counter medicines.  Any problems you or family members have had with anesthetic medicines.  Any blood disorders you have.  Any surgeries you have had.  Any medical  conditions you have, including any recent fever or cold symptoms.  Whether you are pregnant or may be pregnant.  Any previous reactions you have had during a blood transfusion. What are the risks? Generally, this is a safe procedure. However, problems may occur, including:  Having an allergic reaction to something in the donated blood. Hives and itching may be symptoms of this type of reaction.  Fever. This may be a reaction to the white blood cells in the transfused blood. Nausea or chest pain may accompany a fever.  Iron overload. This can happen from having many transfusions.  Transfusion-related acute lung injury (TRALI). This is a rare reaction  that causes lung damage. The cause is not known.TRALI can occur within hours of a transfusion or several days later.  Sudden (acute) or delayed hemolytic reactions. This happens if your blood does not match the cells in your transfusion. Your body's defense system (immune system) may try to attack the new cells. This complication is rare. The symptoms include fever, chills, nausea, and low back pain or chest pain.  Infection or disease transmission. This is rare. What happens before the procedure?  You will have a blood test to determine your blood type. This is necessary to know what kind of blood your body will accept and to match it to the donor blood.  If you are going to have a planned surgery, you may be able to do an autologous blood donation. This may be done in case you need to have a transfusion.  If you have had an allergic reaction to a transfusion in the past, you may be given medicine to help prevent a reaction. This medicine may be given to you by mouth or through an IV tube.  You will have your temperature, blood pressure, and pulse monitored before the transfusion.  Follow instructions from your health care provider about eating and drinking restrictions.  Ask your health care provider about: ? Changing or stopping your regular medicines. This is especially important if you are taking diabetes medicines or blood thinners. ? Taking medicines such as aspirin and ibuprofen. These medicines can thin your blood. Do not take these medicines before your procedure if your health care provider instructs you not to. What happens during the procedure?  An IV tube will be inserted into one of your veins.  The bag of donated blood will be attached to your IV tube. The blood will then enter through your vein.  Your temperature, blood pressure, and pulse will be monitored regularly during the transfusion. This monitoring is done to detect early signs of a transfusion reaction.  If you  have any signs or symptoms of a reaction, your transfusion will be stopped and you may be given medicine.  When the transfusion is complete, your IV tube will be removed.  Pressure may be applied to the IV site for a few minutes.  A bandage (dressing) will be applied. The procedure may vary among health care providers and hospitals. What happens after the procedure?  Your temperature, blood pressure, heart rate, breathing rate, and blood oxygen level will be monitored often.  Your blood may be tested to see how you are responding to the transfusion.  You may be warmed with fluids or blankets to maintain a normal body temperature. Summary  A blood transfusion is a procedure in which you receive donated blood, including plasma, platelets, and red blood cells, through an IV tube.  Your temperature, blood pressure, and pulse will be monitored  before, during, and after the transfusion.  Your blood may be tested after the transfusion to see how your body has responded. This information is not intended to replace advice given to you by your health care provider. Make sure you discuss any questions you have with your health care provider. Document Released: 07/07/2000 Document Revised: 05/27/2018 Document Reviewed: 04/06/2016 Elsevier Patient Education  2020 [employer].

## 2019-03-05 NOTE — Addendum Note (Signed)
Addended by: Margaret Pyle on: 03/05/2019 11:40 AM   Modules accepted: Orders

## 2019-03-05 NOTE — Progress Notes (Signed)
Pt here for picc line flush and labs. Pt requesting to have an appt on Friday for Picc line dressing change. Message to scheduling.

## 2019-03-05 NOTE — Progress Notes (Signed)
[facility] OFFICE PROGRESS NOTE   Diagnosis: CLL, pancytopenia  INTERVAL HISTORY:   Ms. Retana returns as scheduled.  The headache is improved, but she continues to have a daily headache.  She takes Tylenol for relief of the headache.  She takes oxycodone once daily.  No fever.  She has not losing from a few sites at the gums.  No other bleeding.  Good appetite.  Objective:  Vital signs in last 24 hours:  Blood pressure 129/75, pulse 98, temperature 98.3 F (36.8 C), temperature source Temporal, resp. rate 16, height '5\' 6"'  (1.676 m), weight 139 lb (63 kg), SpO2 100 %.    HEENT: No thrush or ulcers.  Oozing at the gumline at a right upper incisor and at the lower midline, no other bleeding  Skin: Resolving ecchymoses over the extremities  Portacath/PICC-without erythema  Lab Results:  Lab Results  Component Value Date   WBC 2.0 (L) 03/05/2019   HGB 10.4 (L) 03/05/2019   HCT 30.8 (L) 03/05/2019   MCV 83.9 03/05/2019   PLT 15 (L) 03/05/2019   NEUTROABS 0.2 (LL) 03/05/2019    CMP  Lab Results  Component Value Date   NA 137 02/28/2019   K 3.7 02/28/2019   CL 107 02/28/2019   CO2 24 02/28/2019   GLUCOSE 112 (H) 02/28/2019   BUN 17 02/28/2019   CREATININE 0.88 02/28/2019   CALCIUM 8.7 (L) 02/28/2019   PROT 8.5 (H) 02/27/2019   ALBUMIN 3.6 02/27/2019   AST 23 02/27/2019   ALT 23 02/27/2019   ALKPHOS 67 02/27/2019   BILITOT 0.3 02/27/2019   GFRNONAA >60 02/28/2019   GFRAA >60 02/28/2019     Medications: I have reviewed the patient's current medications.   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 Mild splenomegaly.  Ultrasound-guided biopsy of  the left inguinal lymph node 09/18/2017, slightly "purulent "fluid aspirated, core biopsy is consistent with an atypical lymphoid proliferation-extensive necrosis with surrounding epithelioid histiocytes, limited intact lymphoid tissue involved with CLL  Incisional biopsy of a necrotic/purulent left inguinal lymph node on 10/01/2017-extensive necrosis with granulomatous inflammation, small amount of viable lymphoid tissue involved with CLL, AFB and fungal stains negative  Peripheral blood FISH analysis 02/05/2018-deletion 13q14, no evidence of p53 (17p13) deletion, no evidence of 11q22deletion  Bone marrow biopsy 02/26/2018-hypercellular marrow with extensive involvement by CLL, lymphocytes represent85% of all cells  Ibrutinib initiated 04/03/2018  Ibrutinib placed on hold 04/11/2018 due to onset of arthralgias  Ibrutinib resumed 04/16/2018, discontinued 04/25/2018 secondary to severe arthralgias/arthritis  Ibrutinib resumed at a dose of 140 mg daily 05/03/2018  Ibrutinib dose adjusted to 140 mg alternating with 28m 06/25/2018  Ibrutinib discontinued 07/03/2018 secondary to severe arthralgias  Acalabrutinib 08/16/2018, discontinued 11/15/2018 secondary to persistent severe transfusion dependent anemia and neutropenia/thrombocytopenia, last dose 11/14/2018  Bone marrow biopsy 11/21/2018-decreased cellularity, involvement by CLL, decreased erythroid and granulocytic precursors, decreased megakaryocytes  Cycle 1 rituximab 12/06/2018  Bone marrow biopsy 12/24/2018 at [facility]-hypocellular bone marrow (10%) involved by CLL, representing 50% of marrow cellularity; markedly decreased trilineage hematopoiesis including essentially absent erythropoiesis  2.Hypothyroidism 3.Hepatitis B surface and core antibody positive  Hepatitis B surface antigen negative and hepatitis B core antibody -12/02/2018 4.Left lung pneumonia diagnosed 10/08/2017-completed 7 days of Levaquin 5.Left lung pneumoniaon chest  x-ray 12/27/2017. Augmentin prescribed.  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 Pancytopenia secondary to CLL and a hypocellular bone marrow  G-CSF and Nplate started 1/74/9449, G-CSF changed to daily beginning 12/17/2018; G-CSF discontinued 12/31/2018, last Nplate 01/15/2019  Began prednisone 60 mg daily 01/11/19, tolerating moderately well except jitteriness, irritability, and difficulty sleeping. Tapered to 40 mg daily x1 week starting 01/24/19, reduce by 10 mg each week until discontinued. Prednisone discontinued 02/20/2019.  Promacta started 01/20/2019, dose increased to 100 mg daily 02/04/2019; dose increased to 150 mg daily 02/21/2019  IVIG daily for 2 days beginning 02/25/2019  9.  Severe headache and nausea/vomiting 02/27/2019-likely related to IVIG therapy, improved     Disposition: Ms. Caporaso appears unchanged.  She has persistent severe pancytopenia.  She last received platelets on 03/03/2019 and packed red blood cells on 02/28/2019.  She will receive platelets today.  The neutrophil count remains low.  She continues antibiotic prophylaxis.  Ms. George is scheduled for a bone marrow biopsy at [facility] tomorrow.  I will discuss the case with Dr.Coombs.  She will most likely be admitted for ATG/cyclosporine therapy next week if the bone marrow remains hypocellular.  There is been no apparent improvement with IVIG.  Ms. Brunker will return for a CBC and PICC care on 03/07/2019 and 03/10/2019.  She will be scheduled for office visit 03/12/2019.  The headaches are much improved, but she continues to have a daily headache.  She was given a dose of sumatriptan at the [facility] today.  This resulted in significant improvement of her headache.  We will prescribe once daily sumatriptan to use as needed.  Betsy Coder, MD  03/05/2019  2:26 PM

## 2019-03-05 NOTE — Patient Instructions (Signed)

## 2019-03-05 NOTE — Telephone Encounter (Signed)
Left VM requesting return call to update on her headache relief. Did she think she obtained enough relief for MD to order this for her?

## 2019-03-05 NOTE — Telephone Encounter (Signed)
Received call report from Marie MT.  "Today's Ruch = 0.2."  Reached provider with results.

## 2019-03-06 ENCOUNTER — Encounter
Admit: 2019-03-06 | Discharge: 2019-03-07 | Payer: PRIVATE HEALTH INSURANCE | Attending: Adult Health | Primary: Adult Health

## 2019-03-06 ENCOUNTER — Encounter: Admit: 2019-03-06 | Discharge: 2019-03-07 | Payer: PRIVATE HEALTH INSURANCE

## 2019-03-06 ENCOUNTER — Telehealth: Payer: Self-pay | Admitting: Oncology

## 2019-03-06 ENCOUNTER — Other Ambulatory Visit: Payer: Self-pay | Admitting: Psychiatry

## 2019-03-06 ENCOUNTER — Other Ambulatory Visit: Payer: Self-pay | Admitting: Oncology

## 2019-03-06 DIAGNOSIS — D696 Thrombocytopenia, unspecified: Secondary | ICD-10-CM

## 2019-03-06 DIAGNOSIS — F418 Other specified anxiety disorders: Secondary | ICD-10-CM | POA: Diagnosis not present

## 2019-03-06 DIAGNOSIS — D801 Nonfamilial hypogammaglobulinemia: Secondary | ICD-10-CM | POA: Diagnosis not present

## 2019-03-06 DIAGNOSIS — C9110 Chronic lymphocytic leukemia of B-cell type not having achieved remission: Secondary | ICD-10-CM | POA: Diagnosis not present

## 2019-03-06 LAB — PREPARE PLATELET PHERESIS: Unit division: 0

## 2019-03-06 LAB — BPAM PLATELET PHERESIS
Blood Product Expiration Date: 202008132359
ISSUE DATE / TIME: 202008121056
Unit Type and Rh: 5100

## 2019-03-06 NOTE — Telephone Encounter (Signed)
Called and spoke with patient. Confirmed upcoming appts  

## 2019-03-07 ENCOUNTER — Inpatient Hospital Stay: Payer: BC Managed Care – PPO

## 2019-03-07 ENCOUNTER — Other Ambulatory Visit: Payer: Self-pay

## 2019-03-07 ENCOUNTER — Other Ambulatory Visit: Payer: BC Managed Care – PPO

## 2019-03-07 ENCOUNTER — Ambulatory Visit: Payer: BC Managed Care – PPO

## 2019-03-07 DIAGNOSIS — F419 Anxiety disorder, unspecified: Secondary | ICD-10-CM | POA: Diagnosis not present

## 2019-03-07 DIAGNOSIS — Z791 Long term (current) use of non-steroidal anti-inflammatories (NSAID): Secondary | ICD-10-CM | POA: Diagnosis not present

## 2019-03-07 DIAGNOSIS — E039 Hypothyroidism, unspecified: Secondary | ICD-10-CM | POA: Diagnosis not present

## 2019-03-07 DIAGNOSIS — D61818 Other pancytopenia: Secondary | ICD-10-CM | POA: Diagnosis not present

## 2019-03-07 DIAGNOSIS — Z95828 Presence of other vascular implants and grafts: Secondary | ICD-10-CM

## 2019-03-07 DIAGNOSIS — Z79899 Other long term (current) drug therapy: Secondary | ICD-10-CM | POA: Diagnosis not present

## 2019-03-07 DIAGNOSIS — Z85820 Personal history of malignant melanoma of skin: Secondary | ICD-10-CM | POA: Diagnosis not present

## 2019-03-07 DIAGNOSIS — C9110 Chronic lymphocytic leukemia of B-cell type not having achieved remission: Secondary | ICD-10-CM | POA: Diagnosis not present

## 2019-03-07 DIAGNOSIS — D696 Thrombocytopenia, unspecified: Secondary | ICD-10-CM

## 2019-03-07 DIAGNOSIS — D801 Nonfamilial hypogammaglobulinemia: Secondary | ICD-10-CM | POA: Diagnosis not present

## 2019-03-07 MED ORDER — HEPARIN SOD (PORK) LOCK FLUSH 100 UNIT/ML IV SOLN
500.0000 [IU] | Freq: Once | INTRAVENOUS | Status: AC | PRN
  Administered 2019-03-07: 500 [IU]
  Filled 2019-03-07: qty 5

## 2019-03-07 MED ORDER — SODIUM CHLORIDE 0.9% FLUSH
10.0000 mL | INTRAVENOUS | Status: DC | PRN
  Administered 2019-03-07: 10 mL
  Filled 2019-03-07: qty 10

## 2019-03-07 NOTE — Patient Instructions (Signed)
PICC Home Care Guide ° °A peripherally inserted central catheter (PICC) is a form of IV access that allows medicines and IV fluids to be quickly distributed throughout the body. The PICC is a long, thin, flexible tube (catheter) that is inserted into a vein in the upper arm. The catheter ends in a large vein in the chest (superior vena cava, or SVC). After the PICC is inserted, a chest X-ray may be done to make sure that it is in the correct place. °A PICC may be placed for different reasons, such as: °· To give medicines and liquid nutrition. °· To give IV fluids and blood products. °· If there is trouble placing a peripheral intravenous (PIV) catheter. °If taken care of properly, a PICC can remain in place for several months. Having a PICC can also allow a person to go home from the hospital sooner. Medicine and PICC care can be managed at home by a family member, caregiver, or home health care team. °What are the risks? °Generally, having a PICC is safe. However, problems may occur, including: °· A blood clot (thrombus) forming in or at the tip of the PICC. °· A blood clot forming in a vein (deep vein thrombosis) or traveling to the lung (pulmonary embolism). °· Inflammation of the vein (phlebitis) in which the PICC is placed. °· Infection. Central line associated blood stream infection (CLABSI) is a serious infection that often requires hospitalization. °· PICC movement (malposition). The PICC tip may move from its original position due to excessive physical activity, forceful coughing, sneezing, or vomiting. °· A break or cut in the PICC. It is important not to use scissors near the PICC. °· Nerve or tendon irritation or injury during PICC insertion. °How to take care of your PICC °Preventing problems °· You and any caregivers should wash your hands often with soap. Wash hands: °? Before touching the PICC line or the infusion device. °? Before changing a bandage (dressing). °· Flush the PICC as told by your  health care provider. Let your health care provider know right away if the PICC is hard to flush or does not flush. Do not use force to flush the PICC. °· Do not use a syringe that is less than 10 mL to flush the PICC. °· Avoid blood pressure checks on the arm in which the PICC is placed. °· Never pull or tug on the PICC. °· Do not take the PICC out yourself. Only a trained clinical professional should remove the PICC. °· Use clean and sterile supplies only. Keep the supplies in a dry place. Do not reuse needles, syringes, or any other supplies. Doing that can lead to infection. °· Keep pets and children away from your PICC line. °· Check the PICC insertion site every day for signs of infection. Check for: °? Leakage. °? Redness, swelling, or pain. °? Fluid or blood. °? Warmth. °? Pus or a bad smell. °PICC dressing care °· Keep your PICC bandage (dressing) clean and dry to prevent infection. °· Do not take baths, swim, or use a hot tub until your health care provider approves. Ask your health care provider if you can take showers. You may only be allowed to take sponge baths for bathing. When you are allowed to shower: °? Ask your health care provider to teach you how to wrap the PICC line. °? Cover the PICC line with clear plastic wrap and tape to keep it dry while showering. °· Follow instructions from your health care provider   about how to take care of your insertion site and dressing. Make sure you: °? Wash your hands with soap and water before you change your bandage (dressing). If soap and water are not available, use hand sanitizer. °? Change your dressing as told by your health care provider. °? Leave stitches (sutures), skin glue, or adhesive strips in place. These skin closures may need to stay in place for 2 weeks or longer. If adhesive strip edges start to loosen and curl up, you may trim the loose edges. Do not remove adhesive strips completely unless your health care provider tells you to do  that. °· Change your PICC dressing if it becomes loose or wet. °General instructions ° °· Carry your PICC identification card or wear a medical alert bracelet at all times. °· Keep the tube clamped at all times, unless it is being used. °· Carry a smooth-edge clamp with you at all times to place on the tube if it breaks. °· Do not use scissors or sharp objects near the tube. °· You may bend your arm and move it freely. If your PICC is near or at the bend of your elbow, avoid activity with repeated motion at the elbow. °· Avoid lifting heavy objects as told by your health care provider. °· Keep all follow-up visits as told by your health care provider. This is important. °Disposal of supplies °· Throw away any syringes in a disposal container that is meant for sharp items (sharps container). You can buy a sharps container from a pharmacy, or you can make one by using an empty hard plastic bottle with a cover. °· Place any used dressings or infusion bags into a plastic bag. Throw that bag in the trash. °Contact a health care provider if: °· You have pain in your arm, ear, face, or teeth. °· You have a fever or chills. °· You have redness, swelling, or pain around the insertion site. °· You have fluid or blood coming from the insertion site. °· Your insertion site feels warm to the touch. °· You have pus or a bad smell coming from the insertion site. °· Your skin feels hard and raised around the insertion site. °Get help right away if: °· Your PICC is accidentally pulled all the way out. If this happens, cover the insertion site with a bandage or gauze dressing. Do not throw the PICC away. Your health care provider will need to check it. °· Your PICC was tugged or pulled and has partially come out. Do not  push the PICC back in. °· You cannot flush the PICC, it is hard to flush, or the PICC leaks around the insertion site when it is flushed. °· You hear a "flushing" sound when the PICC is flushed. °· You feel your  heart racing or skipping beats. °· There is a hole or tear in the PICC. °· You have swelling in the arm in which the PICC was inserted. °· You have a red streak going up your arm from where the PICC was inserted. °Summary °· A peripherally inserted central catheter (PICC) is a long, thin, flexible tube (catheter) that is inserted into a vein in the upper arm. °· The PICC is inserted using a sterile technique by a specially trained nurse or physician. Only a trained clinical professional should remove it. °· Keep your PICC identification card with you at all times. °· Avoid blood pressure checks on the arm in which the PICC is placed. °· If cared for   properly, a PICC can remain in place for several months. Having a PICC can also allow a person to go home from the hospital sooner. °This information is not intended to replace advice given to you by your health care provider. Make sure you discuss any questions you have with your health care provider. °Document Released: 01/14/2003 Document Revised: 06/22/2017 Document Reviewed: 08/12/2016 °Elsevier Patient Education © 2020 Elsevier Inc. ° °

## 2019-03-10 ENCOUNTER — Other Ambulatory Visit: Payer: Self-pay | Admitting: Psychiatry

## 2019-03-10 ENCOUNTER — Inpatient Hospital Stay: Payer: BC Managed Care – PPO

## 2019-03-10 ENCOUNTER — Other Ambulatory Visit: Payer: Self-pay | Admitting: *Deleted

## 2019-03-10 ENCOUNTER — Inpatient Hospital Stay (HOSPITAL_BASED_OUTPATIENT_CLINIC_OR_DEPARTMENT_OTHER): Payer: BC Managed Care – PPO | Admitting: Medical

## 2019-03-10 ENCOUNTER — Other Ambulatory Visit: Payer: Self-pay

## 2019-03-10 ENCOUNTER — Telehealth: Payer: Self-pay | Admitting: Psychiatry

## 2019-03-10 DIAGNOSIS — C9110 Chronic lymphocytic leukemia of B-cell type not having achieved remission: Secondary | ICD-10-CM

## 2019-03-10 DIAGNOSIS — Z95828 Presence of other vascular implants and grafts: Secondary | ICD-10-CM

## 2019-03-10 DIAGNOSIS — Z79899 Other long term (current) drug therapy: Secondary | ICD-10-CM | POA: Diagnosis not present

## 2019-03-10 DIAGNOSIS — E039 Hypothyroidism, unspecified: Secondary | ICD-10-CM | POA: Diagnosis not present

## 2019-03-10 DIAGNOSIS — D696 Thrombocytopenia, unspecified: Secondary | ICD-10-CM

## 2019-03-10 DIAGNOSIS — D649 Anemia, unspecified: Secondary | ICD-10-CM

## 2019-03-10 DIAGNOSIS — L299 Pruritus, unspecified: Secondary | ICD-10-CM

## 2019-03-10 DIAGNOSIS — D801 Nonfamilial hypogammaglobulinemia: Secondary | ICD-10-CM | POA: Diagnosis not present

## 2019-03-10 DIAGNOSIS — F419 Anxiety disorder, unspecified: Secondary | ICD-10-CM | POA: Diagnosis not present

## 2019-03-10 DIAGNOSIS — Z85820 Personal history of malignant melanoma of skin: Secondary | ICD-10-CM | POA: Diagnosis not present

## 2019-03-10 DIAGNOSIS — Z791 Long term (current) use of non-steroidal anti-inflammatories (NSAID): Secondary | ICD-10-CM | POA: Diagnosis not present

## 2019-03-10 DIAGNOSIS — D61818 Other pancytopenia: Secondary | ICD-10-CM | POA: Diagnosis not present

## 2019-03-10 LAB — CBC WITH DIFFERENTIAL (CANCER CENTER ONLY)
Abs Immature Granulocytes: 0 10*3/uL (ref 0.00–0.07)
Basophils Absolute: 0 10*3/uL (ref 0.0–0.1)
Basophils Relative: 0 %
Eosinophils Absolute: 0 10*3/uL (ref 0.0–0.5)
Eosinophils Relative: 0 %
HCT: 24.8 % — ABNORMAL LOW (ref 36.0–46.0)
Hemoglobin: 8.4 g/dL — ABNORMAL LOW (ref 12.0–15.0)
Immature Granulocytes: 0 %
Lymphocytes Relative: 93 %
Lymphs Abs: 1.6 10*3/uL (ref 0.7–4.0)
MCH: 28.3 pg (ref 26.0–34.0)
MCHC: 33.9 g/dL (ref 30.0–36.0)
MCV: 83.5 fL (ref 80.0–100.0)
Monocytes Absolute: 0 10*3/uL — ABNORMAL LOW (ref 0.1–1.0)
Monocytes Relative: 1 %
Neutro Abs: 0.1 10*3/uL — CL (ref 1.7–7.7)
Neutrophils Relative %: 6 %
Platelet Count: 10 10*3/uL — ABNORMAL LOW (ref 150–400)
RBC: 2.97 MIL/uL — ABNORMAL LOW (ref 3.87–5.11)
RDW: 12.6 % (ref 11.5–15.5)
WBC Count: 1.7 10*3/uL — ABNORMAL LOW (ref 4.0–10.5)
nRBC: 0 % (ref 0.0–0.2)

## 2019-03-10 LAB — SAMPLE TO BLOOD BANK

## 2019-03-10 LAB — PREPARE RBC (CROSSMATCH)

## 2019-03-10 MED ORDER — SODIUM CHLORIDE 0.9% IV SOLUTION
250.0000 mL | Freq: Once | INTRAVENOUS | Status: AC
  Administered 2019-03-10: 250 mL via INTRAVENOUS
  Filled 2019-03-10: qty 250

## 2019-03-10 MED ORDER — SODIUM CHLORIDE 0.9% FLUSH
10.0000 mL | INTRAVENOUS | Status: AC | PRN
  Administered 2019-03-10: 10 mL
  Filled 2019-03-10: qty 10

## 2019-03-10 MED ORDER — SODIUM CHLORIDE 0.9% FLUSH
10.0000 mL | INTRAVENOUS | Status: DC | PRN
  Administered 2019-03-10: 09:00:00 10 mL
  Filled 2019-03-10: qty 10

## 2019-03-10 MED ORDER — HEPARIN SOD (PORK) LOCK FLUSH 100 UNIT/ML IV SOLN
250.0000 [IU] | INTRAVENOUS | Status: AC | PRN
  Administered 2019-03-10: 250 [IU]
  Filled 2019-03-10: qty 5

## 2019-03-10 MED ORDER — LITHIUM CARBONATE ER 300 MG PO TBCR: 600.0000 mg | EXTENDED_RELEASE_TABLET | Freq: Every day | ORAL | 1 refills | Status: DC

## 2019-03-10 NOTE — Patient Instructions (Signed)
Platelet Transfusion A platelet transfusion is a procedure in which you receive donated platelets through an IV. Platelets are tiny pieces of blood cells. When you get an injury, platelets clump together in the area to form a blood clot. This helps stop bleeding and is the beginning of the healing process. If you have too few platelets, your blood may have trouble clotting. This may cause you to bleed and bruise very easily. You may need a platelet transfusion if you have a condition that causes a low number of platelets (thrombocytopenia). A platelet transfusion may be used to stop or prevent excessive bleeding. Tell a health care provider about:  Any reactions you have had during previous transfusions.  Any allergies you have.  All medicines you are taking, including vitamins, herbs, eye drops, creams, and over-the-counter medicines.  Any blood disorders you have.  Any surgeries you have had.  Any medical conditions you have.  Whether you are pregnant or may be pregnant. What are the risks? Generally, this is a safe procedure. However, problems may occur, including:  Fever.  Infection.  Allergic reaction to the donor platelets.  Your body's disease-fighting system (immune system) attacking the donor platelets (hemolytic reaction). This is rare.  A rare reaction that causes lung damage (transfusion-related acute lung injury). What happens before the procedure? Medicines  Ask your health care provider about: ? Changing or stopping your regular medicines. This is especially important if you are taking diabetes medicines or blood thinners. ? Taking medicines such as aspirin and ibuprofen. These medicines can thin your blood. Do not take these medicines unless your health care provider tells you to take them. ? Taking over-the-counter medicines, vitamins, herbs, and supplements. General instructions  You will have a blood test to determine your blood type. Your blood type  determines what kind of platelets you will be given.  Follow instructions from your health care provider about eating or drinking restrictions.  If you have had an allergic reaction to a transfusion in the past, you may be given medicine to help prevent a reaction.  Your temperature, blood pressure, pulse, and breathing will be monitored. What happens during the procedure?   An IV will be inserted into one of your veins.  For your safety, two health care providers will verify your identity along with the donor platelets about to be infused.  A bag of donor platelets will be connected to your IV. The platelets will flow into your bloodstream. This usually takes 30-60 minutes.  Your temperature, blood pressure, pulse, and breathing will be monitored during the transfusion. This helps detect early signs of any reaction.  You will also be monitored for other symptoms that may indicate a reaction, including chills, hives, or itching.  If you have signs of a reaction at any time, your transfusion will be stopped, and you may be given medicine to help manage the reaction.  When your transfusion is complete, your IV will be removed.  Pressure may be applied to the IV site for a few minutes to stop any bleeding.  The IV site will be covered with a bandage (dressing). The procedure may vary among health care providers and hospitals. What happens after the procedure?  Your blood pressure, temperature, pulse, and breathing will be monitored until you leave the hospital or clinic.  You may have some bruising and soreness at your IV site. Follow these instructions at home: Medicines  Take over-the-counter and prescription medicines only as told by your health care provider.  Talk with your health care provider before you take any medicines that contain aspirin or NSAIDs. These medicines increase your risk for dangerous bleeding. General instructions  Change or remove your dressing as told  by your health care provider.  Return to your normal activities as told by your health care provider. Ask your health care provider what activities are safe for you.  Do not take baths, swim, or use a hot tub until your health care provider approves. Ask your health care provider if you may take showers.  Check your IV site every day for signs of infection. Check for: ? Redness, swelling, or pain. ? Fluid or blood. If fluid or blood drains from your IV site, use your hands to press down firmly on a bandage covering the area for a minute or two. Doing this should stop the bleeding. ? Warmth. ? Pus or a bad smell.  Keep all follow-up visits as told by your health care provider. This is important. Contact a health care provider if you have:  A headache that does not go away with medicine.  Hives, rash, or itchy skin.  Nausea or vomiting.  Unusual tiredness or weakness.  Signs of infection at your IV site. Get help right away if:  You have a fever or chills.  You urinate less often than usual.  Your urine is darker colored than normal.  You have any of the following: ? Trouble breathing. ? Pain in your back, abdomen, or chest. ? Cool, clammy skin. ? A fast heartbeat. Summary  Platelets are tiny pieces of blood cells that clump together to form a blood clot when you have an injury. If you have too few platelets, your blood may have trouble clotting.  A platelet transfusion is a procedure in which you receive donated platelets through an IV.  A platelet transfusion may be used to stop or prevent excessive bleeding.  After the procedure, check your IV site every day for signs of infection, including redness, swelling, pain, or warmth. This information is not intended to replace advice given to you by your health care provider. Make sure you discuss any questions you have with your health care provider. Document Released: 05/07/2007 Document Revised: 08/15/2017 Document  Reviewed: 08/15/2017 Elsevier Patient Education  2020 [employer].    Blood Transfusion, Adult  A blood transfusion is a procedure in which you receive donated blood, including plasma, platelets, and red blood cells, through an IV tube. You may need a blood transfusion because of illness, surgery, or injury. The blood may come from a donor. You may also be able to donate blood for yourself (autologous blood donation) before a surgery if you know that you might require a blood transfusion. The blood given in a transfusion is made up of different types of cells. You may receive:  Red blood cells. These carry oxygen to the cells in the body.  White blood cells. These help you fight infections.  Platelets. These help your blood to clot.  Plasma. This is the liquid part of your blood and it helps with fluid imbalances. If you have hemophilia or another clotting disorder, you may also receive other types of blood products. Tell a health care provider about:  Any allergies you have.  All medicines you are taking, including vitamins, herbs, eye drops, creams, and over-the-counter medicines.  Any problems you or family members have had with anesthetic medicines.  Any blood disorders you have.  Any surgeries you have had.  Any medical  conditions you have, including any recent fever or cold symptoms.  Whether you are pregnant or may be pregnant.  Any previous reactions you have had during a blood transfusion. What are the risks? Generally, this is a safe procedure. However, problems may occur, including:  Having an allergic reaction to something in the donated blood. Hives and itching may be symptoms of this type of reaction.  Fever. This may be a reaction to the white blood cells in the transfused blood. Nausea or chest pain may accompany a fever.  Iron overload. This can happen from having many transfusions.  Transfusion-related acute lung injury (TRALI). This is a rare reaction  that causes lung damage. The cause is not known.TRALI can occur within hours of a transfusion or several days later.  Sudden (acute) or delayed hemolytic reactions. This happens if your blood does not match the cells in your transfusion. Your body's defense system (immune system) may try to attack the new cells. This complication is rare. The symptoms include fever, chills, nausea, and low back pain or chest pain.  Infection or disease transmission. This is rare. What happens before the procedure?  You will have a blood test to determine your blood type. This is necessary to know what kind of blood your body will accept and to match it to the donor blood.  If you are going to have a planned surgery, you may be able to do an autologous blood donation. This may be done in case you need to have a transfusion.  If you have had an allergic reaction to a transfusion in the past, you may be given medicine to help prevent a reaction. This medicine may be given to you by mouth or through an IV tube.  You will have your temperature, blood pressure, and pulse monitored before the transfusion.  Follow instructions from your health care provider about eating and drinking restrictions.  Ask your health care provider about: ? Changing or stopping your regular medicines. This is especially important if you are taking diabetes medicines or blood thinners. ? Taking medicines such as aspirin and ibuprofen. These medicines can thin your blood. Do not take these medicines before your procedure if your health care provider instructs you not to. What happens during the procedure?  An IV tube will be inserted into one of your veins.  The bag of donated blood will be attached to your IV tube. The blood will then enter through your vein.  Your temperature, blood pressure, and pulse will be monitored regularly during the transfusion. This monitoring is done to detect early signs of a transfusion reaction.  If you  have any signs or symptoms of a reaction, your transfusion will be stopped and you may be given medicine.  When the transfusion is complete, your IV tube will be removed.  Pressure may be applied to the IV site for a few minutes.  A bandage (dressing) will be applied. The procedure may vary among health care providers and hospitals. What happens after the procedure?  Your temperature, blood pressure, heart rate, breathing rate, and blood oxygen level will be monitored often.  Your blood may be tested to see how you are responding to the transfusion.  You may be warmed with fluids or blankets to maintain a normal body temperature. Summary  A blood transfusion is a procedure in which you receive donated blood, including plasma, platelets, and red blood cells, through an IV tube.  Your temperature, blood pressure, and pulse will be monitored  before, during, and after the transfusion.  Your blood may be tested after the transfusion to see how your body has responded. This information is not intended to replace advice given to you by your health care provider. Make sure you discuss any questions you have with your health care provider. Document Released: 07/07/2000 Document Revised: 05/27/2018 Document Reviewed: 04/06/2016 Elsevier Patient Education  2020 [employer].

## 2019-03-10 NOTE — Progress Notes (Signed)
Pt almost at the end of her blood transfusion, had sudden onset of itching with her legs & left arm only, no rash noted.  Transfusion stopped. Has just applied shea butter lotion.  Shelia Media PA notified, is coming to see pt.  [id number] - V. Tanner PA to see pt., pt does not wish to take any meds at this time, blood transfusion resumed per PA.

## 2019-03-10 NOTE — Telephone Encounter (Signed)
Pt left v-mail requesting refill on Lithium 300 mg 2 qhs  #180 @ [facility] [address]. Out for 4 days

## 2019-03-10 NOTE — Progress Notes (Addendum)
Review of labs today: Patient with bleeding gums last night and feels short of breath. Will transfuse platelet and 1 unit blood today. Blood bank notified of orders. Panic ANC 0.1 noted and discussed with Lacie Burton,NP. Continue current antibiotics, neutropenic precautions and call for fever or s/s infection. F/U on 8/19 as scheduled. Sees Dr. Prince Solian at [facility] tomorrow.

## 2019-03-10 NOTE — Telephone Encounter (Signed)
Patient should be on lithium CR 300 mg tablets 2 nightly.  [facility]

## 2019-03-10 NOTE — Progress Notes (Signed)
Per Dr. Benay Spice: She needs to resume her Amicar at prior dosing. Left VM for her to resume Amicar at 1000 mg every 8 hours. Requested return call to confirm.

## 2019-03-11 ENCOUNTER — Encounter: Admit: 2019-03-11 | Discharge: 2019-03-11 | Payer: PRIVATE HEALTH INSURANCE

## 2019-03-11 ENCOUNTER — Ambulatory Visit
Admit: 2019-03-11 | Discharge: 2019-03-11 | Payer: PRIVATE HEALTH INSURANCE | Attending: Hematology & Oncology | Primary: Hematology & Oncology

## 2019-03-11 ENCOUNTER — Telehealth: Payer: Self-pay | Admitting: Nurse Practitioner

## 2019-03-11 ENCOUNTER — Telehealth: Payer: Self-pay | Admitting: *Deleted

## 2019-03-11 ENCOUNTER — Telehealth: Payer: Self-pay | Admitting: Psychiatry

## 2019-03-11 ENCOUNTER — Other Ambulatory Visit: Payer: Self-pay | Admitting: Psychiatry

## 2019-03-11 DIAGNOSIS — D619 Aplastic anemia, unspecified: Secondary | ICD-10-CM | POA: Diagnosis not present

## 2019-03-11 DIAGNOSIS — F419 Anxiety disorder, unspecified: Secondary | ICD-10-CM | POA: Diagnosis not present

## 2019-03-11 DIAGNOSIS — B1910 Unspecified viral hepatitis B without hepatic coma: Secondary | ICD-10-CM | POA: Diagnosis not present

## 2019-03-11 DIAGNOSIS — D801 Nonfamilial hypogammaglobulinemia: Secondary | ICD-10-CM | POA: Diagnosis not present

## 2019-03-11 DIAGNOSIS — Z79899 Other long term (current) drug therapy: Secondary | ICD-10-CM | POA: Diagnosis not present

## 2019-03-11 DIAGNOSIS — C9110 Chronic lymphocytic leukemia of B-cell type not having achieved remission: Secondary | ICD-10-CM | POA: Diagnosis not present

## 2019-03-11 DIAGNOSIS — D61818 Other pancytopenia: Secondary | ICD-10-CM | POA: Diagnosis not present

## 2019-03-11 DIAGNOSIS — E039 Hypothyroidism, unspecified: Secondary | ICD-10-CM | POA: Diagnosis not present

## 2019-03-11 LAB — BPAM PLATELET PHERESIS
Blood Product Expiration Date: 202008192359
ISSUE DATE / TIME: 202008171023
Unit Type and Rh: 5100

## 2019-03-11 LAB — TYPE AND SCREEN
ABO/RH(D): O POS
Antibody Screen: NEGATIVE
Unit division: 0

## 2019-03-11 LAB — PREPARE PLATELET PHERESIS: Unit division: 0

## 2019-03-11 LAB — BPAM RBC
Blood Product Expiration Date: 202009092359
ISSUE DATE / TIME: 202008171142
Unit Type and Rh: 5100

## 2019-03-11 NOTE — Telephone Encounter (Signed)
The cost is going to be over $200 for a month supply.  I am happy to send in the prescription or we can give her some samples to try to tide her over.  We do not currently have enough for a full month supply and samples but I am requesting more samples.

## 2019-03-11 NOTE — Telephone Encounter (Signed)
Called to report she saw Dr. Prince Solian today and was told she does not need lab/OV tomorrow. Dr. Prince Solian suggests reschedule to 03/13/19. Platelets today 20,000 and Hgb 10.0

## 2019-03-11 NOTE — Telephone Encounter (Signed)
Patient called and said that she has lost her trintellix and she needs another refill on her trintellix and is aware she will have to pay out of pocket. [facility] [address] and [address]

## 2019-03-11 NOTE — Telephone Encounter (Signed)
Scheduled appt per 8/18 sch message - spoke with patient and she is aware of new appt date and time

## 2019-03-11 NOTE — Progress Notes (Signed)
The patient was seen in the infusion room as she was receiving a transfusion of packed red blood cells today.  She reported itching in her legs towards the end of her transfusion.  Of note she had just put a new lotion on her legs.  She stated that this was the first time she used it.  She was not having any other issues concerns such as shortness of breath or tightness in her chest.  She was told to continue monitoring the itching.  She will use Benadryl at home tonight if needed.  No other intervention was indicated.  She was able to complete the remainder of her transfusion without any other issues of concern.  Sandi Mealy, MHS, PA-C Physician Assistant

## 2019-03-12 ENCOUNTER — Inpatient Hospital Stay: Payer: BC Managed Care – PPO | Admitting: Oncology

## 2019-03-12 ENCOUNTER — Inpatient Hospital Stay: Payer: BC Managed Care – PPO

## 2019-03-12 NOTE — Progress Notes (Addendum)
[place]   Telephone:(336) [phone] [phone]   Clinic Follow up Note   Patient Care Team: Lavone Orn, MD as PCP - General (Internal Medicine) 03/13/2019  CHIEF COMPLAINT: CLL, pancytopenia  INTERVAL HISTORY: Meagan Walsh returns for follow-up as scheduled.  She underwent recent bone marrow biopsy and was seen by Dr. Prince Solian at [facility] this week. She received platelet and RBC on 8/17. Toward the end of RBC she developed pruritic rash on her legs; she was seen by PA and discharged home. She took benadryl at home and rash resolved. She otherwise is doing well. Energy level is fair. Eating and drinking well. Denies fever or chills. Has intermittent bleeding from right upper gums. Denies other bleeding such as epistaxis, GU/GI bleeding. Denies cough, chest pain, dyspnea. Denies any adenopathy. Denies pain.    MEDICAL HISTORY:  Past Medical History:  Diagnosis Date  . Anxiety   . Cancer ([place])    CLL  . Degenerative disk disease    Neck--chronic pain  . Depression   . Fibroids    Uterine  . 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 Hypothyroidism   . Lymphocytosis   . Mitral valve prolapse   . Mitral valve prolapse   . Thyroid disease    Hypothyroid    SURGICAL HISTORY: Past Surgical History:  Procedure Laterality Date  . BREAST BIOPSY Left 2000  . CESAREAN SECTION  1996  . MELANOMA EXCISION WITH SENTINEL LYMPH NODE BIOPSY Left 10/01/2017   Procedure: EXCISIONAL BIOPSY OF LEFT INGUINAL LYMPH NODE;  Surgeon: Greer Pickerel, MD;  Location: [facility];  Service: General;  Laterality: Left;  . REVISION OF ABDOMINAL SCAR    . TONSILLECTOMY     age 58    I have reviewed the social history and family history with the patient and they are unchanged from previous note.  ALLERGIES:  has No Known Allergies.  MEDICATIONS:  Current Outpatient Medications  Medication Sig Dispense Refill  . acetaminophen (TYLENOL) 325 MG tablet Take 650 mg by  mouth every 6 (six) hours as needed.    . ALPRAZolam (XANAX) 0.25 MG tablet TAKE 1 TO 2 TABLETS BY MOUTH EVERY DAY AS NEEDED FOR ANXIETY (Patient taking differently: Take 0.25-50 mg by mouth daily as needed for anxiety. ) 30 tablet 3  . aminocaproic acid (AMICAR) 500 MG tablet Take 1,000 mg by mouth every 8 (eight) hours.    . fluconazole (DIFLUCAN) 100 MG tablet Take 1 tablet (100 mg total) by mouth daily. 30 tablet 1  . folic acid (FOLVITE) 1 MG tablet Take 1 tablet (1 mg total) by mouth daily. 30 tablet 2  . Ibuprofen 200 MG CAPS Take 400 mg by mouth as needed (headache).     [name] levofloxacin (LEVAQUIN) 500 MG tablet Take 1 tablet (500 mg total) by mouth daily. 30 tablet 1  . levonorgestrel (MIRENA) 20 MCG/24HR IUD 1 each by Intrauterine route once.     [name] levothyroxine (SYNTHROID, LEVOTHROID) 100 MCG tablet Take 100 mcg by mouth daily.    [name] lithium carbonate (LITHOBID) 300 MG CR tablet Take 2 tablets (600 mg total) by mouth at bedtime. 180 tablet 1  . oxyCODONE-acetaminophen (PERCOCET/ROXICET) 5-325 MG tablet Take 1 tablet by mouth every 8 (eight) hours as needed for moderate pain or severe pain. 30 tablet 0  . prochlorperazine (COMPAZINE) 5 MG tablet Take 1 tablet (5 mg total) by mouth every 6 (six) hours as needed for up  to 3 days for nausea or vomiting. 12 tablet 0  . SUMAtriptan (IMITREX) 20 MG/ACT nasal spray Place 1 spray (20 mg total) into the nose daily as needed for migraine or headache. 12 Inhaler 6  . valACYclovir (VALTREX) 500 MG tablet TAKE 1 TABLET BY MOUTH TWICE A DAY 60 tablet 3  . vortioxetine HBr (TRINTELLIX) 20 MG TABS tablet Take 1 tablet (20 mg total) by mouth daily. 90 tablet 0  . zolpidem (AMBIEN) 10 MG tablet TAKE 1/2 TO 1 TABLET BY MOUTH AS NEEDED FOR INSOMNIA. (Patient taking differently: Take 5-10 mg by mouth at bedtime as needed for sleep. ) 30 tablet 5   No current facility-administered medications for this visit.    Facility-Administered Medications Ordered in Other  Visits  Medication Dose Route Frequency Provider Last Rate Last Dose  . famotidine (PEPCID) IVPB 20 mg premix  20 mg Intravenous Once Betsy Coder B, MD      . heparin lock flush 100 unit/mL  250 Units Intracatheter PRN Cira Rue K, NP      . sodium chloride flush (NS) 0.9 % injection 10 mL  10 mL Intracatheter PRN Ladell Pier, MD   10 mL at 02/21/19 0945  . SUMAtriptan (IMITREX) nasal spray 20 mg  20 mg Nasal Once Ladell Pier, MD        PHYSICAL EXAMINATION:  Vitals:   03/13/19 0833  BP: 106/63  Pulse: (!) 104  Resp: 18  Temp: 98.3 F (36.8 C)  SpO2: 100%   Filed Weights   03/13/19 0833  Weight: 141 lb 6.4 oz (64.1 kg)    GENERAL:alert, no distress and comfortable SKIN: no rash or petechiae EYES: sclera clear OROPHARYNX:no thrush or ulcers. Minimal gingival oozing at right upper incisor LYMPH:  no palpable cervical or supraclavicular lymphadenopathy LUNGS: clear to auscultation with normal breathing effort HEART: regular rate & rhythm, no lower extremity edema NEURO: alert & oriented x 3 with fluent speech PICC without erythema   LABORATORY DATA:  I have reviewed the data as listed CBC Latest Ref Rng & Units 03/13/2019 03/10/2019 03/05/2019  WBC 4.0 - 10.5 K/uL 1.6(L) 1.7(L) 2.0(L)  Hemoglobin 12.0 - 15.0 g/dL 10.0(L) 8.4(L) 10.4(L)  Hematocrit 36.0 - 46.0 % 29.3(L) 24.8(L) 30.8(L)  Platelets 150 - 400 K/uL 9(LL) 10(L) 15(L)     CMP Latest Ref Rng & Units 02/28/2019 02/27/2019 02/24/2019  Glucose 70 - 99 mg/dL 112(H) 137(H) 109(H)  BUN 6 - 20 mg/dL 17 26(H) 26(H)  Creatinine 0.44 - 1.00 mg/dL 0.88 0.93 0.82  Sodium 135 - 145 mmol/L 137 139 142  Potassium 3.5 - 5.1 mmol/L 3.7 3.6 4.1  Chloride 98 - 111 mmol/L 107 109 109  CO2 22 - 32 mmol/L _0 Calcium 8.9 - 10.3 mg/dL 8.7(L) 8.9 9.2  Total Protein 6.5 - 8.1 g/dL - 8.5(H) 5.9(L)  Total Bilirubin 0.3 - 1.2 mg/dL - 0.3 0.5  Alkaline Phos 38 - 126 U/L - 67 87  AST 15 - 41 U/L - 23 17  ALT 0 - 44 U/L  - 23 18      RADIOGRAPHIC STUDIES: I have personally reviewed the radiological images as listed and agreed with the findings in the report. No results found.   ASSESSMENT & PLAN:  CLL-diagnosed in August 2010, flow cytometry consistent with CLL Enlarged leftinguinal lymph node January 2019,smallneck/axillary nodes and palpable splenomegaly 09/11/2017  CTson 09/17/2017-3 cm necrotic appearing lymph node in the left inguinal region, borderline enlarged pelvic/retroperitoneal,  chest, and axillary nodes. Mild splenomegaly.  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, no evidence of 11q22deletion  Bone marrow biopsy 02/26/2018-hypercellular marrow with extensive involvement by CLL, lymphocytes represent85% of all cells  Ibrutinib initiated 04/03/2018  Ibrutinib placed on hold 04/11/2018 due to onset of arthralgias  Ibrutinib resumed 04/16/2018, discontinued 04/25/2018 secondary to severe arthralgias/arthritis  Ibrutinib resumed at a dose of 140 mg daily 05/03/2018  Ibrutinib dose adjusted to 140 mg alternating with 221m 06/25/2018  Ibrutinib discontinued 07/03/2018 secondary to severe arthralgias  Acalabrutinib 08/16/2018, discontinued 11/15/2018 secondary to persistent severe transfusion dependent anemia and neutropenia/thrombocytopenia, last dose 11/14/2018  Bone marrow biopsy 11/21/2018-decreased cellularity, involvement by CLL, decreased  erythroid and granulocytic precursors, decreased megakaryocytes  Cycle 1 rituximab 12/06/2018  Bone marrow biopsy 12/24/2018 at [facility]-hypocellular bone marrow (10%) involved by CLL, representing 50% of marrow cellularity; markedly decreased trilineage hematopoiesis including essentially absent erythropoiesis  2.Hypothyroidism 3.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 Augmentin prescribed. 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 Pancytopenia secondary to CLL and a hypocellular bone marrow  G-CSF and Nplate started 56/28/3151 G-CSF changed to daily beginning 12/17/2018; G-CSF discontinued 12/31/2018, last Nplate 01/15/2019  Began prednisone 60 mg daily 01/11/19, tolerating moderately well except jitteriness, irritability, and difficulty sleeping. Tapered to 40 mg daily x1 week starting 01/24/19, reduce by 10 mg each week until discontinued. Prednisone discontinued 02/20/2019.  Promacta started 01/20/2019, dose increased to 100 mg daily 02/04/2019; dose increased to 150 mg daily 02/21/2019  IVIG daily for 2 days beginning 02/25/2019  9. Severe headache and nausea/vomiting 02/27/2019-likely related to IVIG therapy, improved   Disposition: Ms. NLorekappears stable. She has persistent severe pancytopenia. She received platelets and packed red cells on 03/10/2019. She developed a mild pruritic rash over her lower extremities during RBC transfusion that resolved with benadryl at home. Labs reviewed, PLT 9k, she has bleeding at the gumline. She will receive platelets today. ANC remains 0.1; she continues  antibiotic prophylaxis. Hgb stable at 10.0.   [facility] molecular studies from her recent bone marrow biopsy. She is followed by Dr. CPrince Solian As of now this is scheduled on 04/03/19. We will continue to provide supportive care in the meantime.  Ms. NReitherwas seen with Dr. SBenay Spice She will return for CBC check on 8/21 to determine her next transfusion. She will return for lab and PICC care MWF next week, she will have f/u on 8/26. She anticipates going to the beach 8/30 - 9/5 before her scheduled admission. [facility]    Orders Placed This Encounter  Procedures  . CBC with Differential (Cancer Center Only)    Standing Status:   Future    Standing Expiration Date:   03/12/2020  . Prepare  Pheresed Platelets    Standing Status:   Standing    Number of Occurrences:   1    Order Specific Question:   Number of Apheresis Units    Answer:   1 unit (6-10 packs)    Order Specific Question:   Transfusion Indications    Answer:   PLT Count </=10,000/mm    Order Specific Question:   Special Requirements    Answer:   Irradiated  . Sample to Blood Bank    Standing Status:   Future    Standing Expiration Date:   03/12/2020   All questions were answered. The patient knows to call the clinic with any problems, questions or concerns. No barriers to learning was detected.     Alla Feeling, NP 03/13/19   This was a shared visit with Cira Rue.  Ms. Alber has persistent severe pancytopenia.  She will receive a platelet transfusion today.  The repeat bone marrow biopsy confirms persistent achalasia.  I have communicated with Dr. Prince Solian.  The plan is to admit [name] for ATG/cyclosporine at [facility].  Ms. Geffert would like to take a beach trip prior to the hospital admission.  We will continue Red cell and platelet support.  We will attempt to find a provider in [place] to provide transfusion support while she is  there.  [name], MD

## 2019-03-13 ENCOUNTER — Encounter: Payer: Self-pay | Admitting: Nurse Practitioner

## 2019-03-13 ENCOUNTER — Other Ambulatory Visit: Payer: Self-pay

## 2019-03-13 ENCOUNTER — Inpatient Hospital Stay: Payer: BC Managed Care – PPO

## 2019-03-13 ENCOUNTER — Other Ambulatory Visit: Payer: Self-pay | Admitting: *Deleted

## 2019-03-13 ENCOUNTER — Inpatient Hospital Stay (HOSPITAL_BASED_OUTPATIENT_CLINIC_OR_DEPARTMENT_OTHER): Payer: BC Managed Care – PPO | Admitting: Nurse Practitioner

## 2019-03-13 VITALS — BP 101/77 | HR 80 | Temp 99.2°F | Resp 16

## 2019-03-13 VITALS — BP 106/63 | HR 104 | Temp 98.3°F | Resp 18 | Ht 66.0 in | Wt 141.4 lb

## 2019-03-13 DIAGNOSIS — D696 Thrombocytopenia, unspecified: Secondary | ICD-10-CM

## 2019-03-13 DIAGNOSIS — D61818 Other pancytopenia: Secondary | ICD-10-CM | POA: Diagnosis not present

## 2019-03-13 DIAGNOSIS — C9110 Chronic lymphocytic leukemia of B-cell type not having achieved remission: Secondary | ICD-10-CM

## 2019-03-13 DIAGNOSIS — Z791 Long term (current) use of non-steroidal anti-inflammatories (NSAID): Secondary | ICD-10-CM | POA: Diagnosis not present

## 2019-03-13 DIAGNOSIS — Z79899 Other long term (current) drug therapy: Secondary | ICD-10-CM | POA: Diagnosis not present

## 2019-03-13 DIAGNOSIS — Z95828 Presence of other vascular implants and grafts: Secondary | ICD-10-CM

## 2019-03-13 DIAGNOSIS — D801 Nonfamilial hypogammaglobulinemia: Secondary | ICD-10-CM | POA: Diagnosis not present

## 2019-03-13 DIAGNOSIS — Z85820 Personal history of malignant melanoma of skin: Secondary | ICD-10-CM | POA: Diagnosis not present

## 2019-03-13 DIAGNOSIS — F419 Anxiety disorder, unspecified: Secondary | ICD-10-CM | POA: Diagnosis not present

## 2019-03-13 DIAGNOSIS — E039 Hypothyroidism, unspecified: Secondary | ICD-10-CM | POA: Diagnosis not present

## 2019-03-13 LAB — CBC WITH DIFFERENTIAL (CANCER CENTER ONLY)
Abs Immature Granulocytes: 0 10*3/uL (ref 0.00–0.07)
Basophils Absolute: 0 10*3/uL (ref 0.0–0.1)
Basophils Relative: 0 %
Eosinophils Absolute: 0 10*3/uL (ref 0.0–0.5)
Eosinophils Relative: 0 %
HCT: 29.3 % — ABNORMAL LOW (ref 36.0–46.0)
Hemoglobin: 10 g/dL — ABNORMAL LOW (ref 12.0–15.0)
Immature Granulocytes: 0 %
Lymphocytes Relative: 96 %
Lymphs Abs: 1.5 10*3/uL (ref 0.7–4.0)
MCH: 28.9 pg (ref 26.0–34.0)
MCHC: 34.1 g/dL (ref 30.0–36.0)
MCV: 84.7 fL (ref 80.0–100.0)
Monocytes Absolute: 0 10*3/uL — ABNORMAL LOW (ref 0.1–1.0)
Monocytes Relative: 1 %
Neutro Abs: 0.1 10*3/uL — CL (ref 1.7–7.7)
Neutrophils Relative %: 3 %
Platelet Count: 9 10*3/uL — CL (ref 150–400)
RBC: 3.46 MIL/uL — ABNORMAL LOW (ref 3.87–5.11)
RDW: 12.8 % (ref 11.5–15.5)
WBC Count: 1.6 10*3/uL — ABNORMAL LOW (ref 4.0–10.5)
nRBC: 0 % (ref 0.0–0.2)

## 2019-03-13 LAB — SAMPLE TO BLOOD BANK

## 2019-03-13 MED ORDER — HEPARIN SOD (PORK) LOCK FLUSH 100 UNIT/ML IV SOLN
500.0000 [IU] | Freq: Once | INTRAVENOUS | Status: AC | PRN
  Filled 2019-03-13: qty 5

## 2019-03-13 MED ORDER — HEPARIN SOD (PORK) LOCK FLUSH 100 UNIT/ML IV SOLN
250.0000 [IU] | INTRAVENOUS | Status: AC | PRN
  Administered 2019-03-13: 250 [IU]
  Filled 2019-03-13: qty 5

## 2019-03-13 MED ORDER — SODIUM CHLORIDE 0.9% FLUSH
10.0000 mL | INTRAVENOUS | Status: DC | PRN
  Administered 2019-03-13: 10 mL
  Filled 2019-03-13: qty 10

## 2019-03-13 MED ORDER — SODIUM CHLORIDE 0.9% IV SOLUTION
250.0000 mL | Freq: Once | INTRAVENOUS | Status: AC
  Administered 2019-03-13: 250 mL via INTRAVENOUS
  Filled 2019-03-13: qty 250

## 2019-03-13 MED ORDER — CYCLOSPORINE MODIFIED 100 MG CAPSULE
ORAL_CAPSULE | Freq: Two times a day (BID) | ORAL | 2 refills | 30 days | Status: CP
Start: 2019-03-13 — End: 2020-03-12

## 2019-03-14 ENCOUNTER — Other Ambulatory Visit: Payer: Self-pay | Admitting: *Deleted

## 2019-03-14 ENCOUNTER — Inpatient Hospital Stay: Payer: BC Managed Care – PPO

## 2019-03-14 ENCOUNTER — Telehealth: Payer: Self-pay

## 2019-03-14 ENCOUNTER — Telehealth: Payer: Self-pay | Admitting: Oncology

## 2019-03-14 ENCOUNTER — Telehealth: Payer: Self-pay | Admitting: Nurse Practitioner

## 2019-03-14 ENCOUNTER — Telehealth: Payer: Self-pay | Admitting: *Deleted

## 2019-03-14 ENCOUNTER — Other Ambulatory Visit: Payer: Self-pay

## 2019-03-14 DIAGNOSIS — C9110 Chronic lymphocytic leukemia of B-cell type not having achieved remission: Secondary | ICD-10-CM | POA: Diagnosis not present

## 2019-03-14 DIAGNOSIS — E039 Hypothyroidism, unspecified: Secondary | ICD-10-CM | POA: Diagnosis not present

## 2019-03-14 DIAGNOSIS — Z79899 Other long term (current) drug therapy: Secondary | ICD-10-CM | POA: Diagnosis not present

## 2019-03-14 DIAGNOSIS — D696 Thrombocytopenia, unspecified: Secondary | ICD-10-CM

## 2019-03-14 DIAGNOSIS — D61818 Other pancytopenia: Secondary | ICD-10-CM | POA: Diagnosis not present

## 2019-03-14 DIAGNOSIS — Z791 Long term (current) use of non-steroidal anti-inflammatories (NSAID): Secondary | ICD-10-CM | POA: Diagnosis not present

## 2019-03-14 DIAGNOSIS — F419 Anxiety disorder, unspecified: Secondary | ICD-10-CM | POA: Diagnosis not present

## 2019-03-14 DIAGNOSIS — Z85820 Personal history of malignant melanoma of skin: Secondary | ICD-10-CM | POA: Diagnosis not present

## 2019-03-14 DIAGNOSIS — D801 Nonfamilial hypogammaglobulinemia: Secondary | ICD-10-CM | POA: Diagnosis not present

## 2019-03-14 DIAGNOSIS — Z95828 Presence of other vascular implants and grafts: Secondary | ICD-10-CM

## 2019-03-14 LAB — CBC WITH DIFFERENTIAL (CANCER CENTER ONLY)
Abs Immature Granulocytes: 0 10*3/uL (ref 0.00–0.07)
Basophils Absolute: 0 10*3/uL (ref 0.0–0.1)
Basophils Relative: 0 %
Eosinophils Absolute: 0 10*3/uL (ref 0.0–0.5)
Eosinophils Relative: 0 %
HCT: 29.1 % — ABNORMAL LOW (ref 36.0–46.0)
Hemoglobin: 9.7 g/dL — ABNORMAL LOW (ref 12.0–15.0)
Immature Granulocytes: 0 %
Lymphocytes Relative: 96 %
Lymphs Abs: 1.4 10*3/uL (ref 0.7–4.0)
MCH: 28 pg (ref 26.0–34.0)
MCHC: 33.3 g/dL (ref 30.0–36.0)
MCV: 83.9 fL (ref 80.0–100.0)
Monocytes Absolute: 0 10*3/uL — ABNORMAL LOW (ref 0.1–1.0)
Monocytes Relative: 1 %
Neutro Abs: 0.1 10*3/uL — CL (ref 1.7–7.7)
Neutrophils Relative %: 3 %
Platelet Count: 20 10*3/uL — ABNORMAL LOW (ref 150–400)
RBC: 3.47 MIL/uL — ABNORMAL LOW (ref 3.87–5.11)
RDW: 12.8 % (ref 11.5–15.5)
WBC Count: 1.5 10*3/uL — ABNORMAL LOW (ref 4.0–10.5)
nRBC: 0 % (ref 0.0–0.2)

## 2019-03-14 LAB — SAMPLE TO BLOOD BANK

## 2019-03-14 LAB — BPAM PLATELET PHERESIS
Blood Product Expiration Date: 202008212359
ISSUE DATE / TIME: 202008201051
Unit Type and Rh: 7300

## 2019-03-14 LAB — PREPARE PLATELET PHERESIS: Unit division: 0

## 2019-03-14 LAB — PARVOVIRUS INTERPRETATION

## 2019-03-14 MED ORDER — HEPARIN SOD (PORK) LOCK FLUSH 100 UNIT/ML IV SOLN
500.0000 [IU] | Freq: Once | INTRAVENOUS | Status: AC | PRN
  Administered 2019-03-14: 500 [IU]
  Filled 2019-03-14: qty 5

## 2019-03-14 MED ORDER — SODIUM CHLORIDE 0.9% FLUSH
10.0000 mL | INTRAVENOUS | Status: DC | PRN
  Administered 2019-03-14: 10 mL
  Filled 2019-03-14: qty 10

## 2019-03-14 NOTE — Unmapped (Signed)
[facility] Medication Referral: No PA required    Medication (Brand/Generic): Cyclosporine 100 mg capsules    Initial Benefits Investigation Claim completed with resulted information below:  No PA required  Patient ABLE to fill at [facility]  Insurance Company:  [employer] [place]  Anticipated Copay: $10.00    As Co-pay is under $25 defined limit, per policy there will be no further investigation of need for financial assistance at this time unless patient requests. This referral has been communicated to the provider and handed off to the [facility] team for further processing and filling of prescribed medication.   ______________________________________________________________________  Please utilize this referral for viewing purposes as it will serve as the central location for all relevant documentation and updates.

## 2019-03-14 NOTE — Telephone Encounter (Signed)
Scheduled appt per 8/20 los.  Patient is aware of the appts I was able to add.  Sent a staff message to get the other appts added.  Patient will receive another phone call once appts have been added.

## 2019-03-14 NOTE — Telephone Encounter (Signed)
Scheduled appt for Meagan Walsh for pt on 8/24 - called pt and let them know , pt is aware and she knows that we are still waiting approval to add blood for 8/26

## 2019-03-14 NOTE — Telephone Encounter (Signed)
Oral Oncology Patient Advocate Encounter  Meagan Walsh spoke with the patient on 8/13. Meagan Walsh stated that she would call them back when she knew how much medicine she had left and when she would need her next fill. [place] hasn't spoke to her since.   I attempted to call the patient and had to leave a voicemail.  [place] Patient [place] Phone [phone] Fax [phone] 03/14/2019   3:15 PM

## 2019-03-14 NOTE — Telephone Encounter (Signed)
TCT patient and advised her that she has an appt for platelet transfusion tomorrow, 03/15/19 @ 9am.  Advised that her platelet count was 20k today. She voiced understanding.

## 2019-03-15 ENCOUNTER — Inpatient Hospital Stay: Payer: BC Managed Care – PPO

## 2019-03-15 DIAGNOSIS — Z79899 Other long term (current) drug therapy: Secondary | ICD-10-CM | POA: Diagnosis not present

## 2019-03-15 DIAGNOSIS — D801 Nonfamilial hypogammaglobulinemia: Secondary | ICD-10-CM | POA: Diagnosis not present

## 2019-03-15 DIAGNOSIS — F419 Anxiety disorder, unspecified: Secondary | ICD-10-CM | POA: Diagnosis not present

## 2019-03-15 DIAGNOSIS — Z791 Long term (current) use of non-steroidal anti-inflammatories (NSAID): Secondary | ICD-10-CM | POA: Diagnosis not present

## 2019-03-15 DIAGNOSIS — D61818 Other pancytopenia: Secondary | ICD-10-CM | POA: Diagnosis not present

## 2019-03-15 DIAGNOSIS — C9110 Chronic lymphocytic leukemia of B-cell type not having achieved remission: Secondary | ICD-10-CM | POA: Diagnosis not present

## 2019-03-15 DIAGNOSIS — E039 Hypothyroidism, unspecified: Secondary | ICD-10-CM | POA: Diagnosis not present

## 2019-03-15 DIAGNOSIS — D696 Thrombocytopenia, unspecified: Secondary | ICD-10-CM

## 2019-03-15 DIAGNOSIS — Z85820 Personal history of malignant melanoma of skin: Secondary | ICD-10-CM | POA: Diagnosis not present

## 2019-03-15 MED ORDER — SODIUM CHLORIDE 0.9% IV SOLUTION
250.0000 mL | Freq: Once | INTRAVENOUS | Status: AC
  Administered 2019-03-15: 250 mL via INTRAVENOUS
  Filled 2019-03-15: qty 250

## 2019-03-15 MED ORDER — SODIUM CHLORIDE 0.9% FLUSH
3.0000 mL | INTRAVENOUS | Status: AC | PRN
  Administered 2019-03-15: 3 mL
  Filled 2019-03-15: qty 10

## 2019-03-15 MED ORDER — HEPARIN SOD (PORK) LOCK FLUSH 100 UNIT/ML IV SOLN
250.0000 [IU] | INTRAVENOUS | Status: AC | PRN
  Administered 2019-03-15: 250 [IU]
  Filled 2019-03-15: qty 5

## 2019-03-17 ENCOUNTER — Telehealth: Payer: Self-pay | Admitting: Oncology

## 2019-03-17 ENCOUNTER — Inpatient Hospital Stay: Payer: BC Managed Care – PPO

## 2019-03-17 ENCOUNTER — Telehealth: Payer: Self-pay

## 2019-03-17 ENCOUNTER — Other Ambulatory Visit: Payer: Self-pay

## 2019-03-17 VITALS — BP 101/53 | HR 84 | Temp 98.4°F | Resp 16

## 2019-03-17 DIAGNOSIS — C9110 Chronic lymphocytic leukemia of B-cell type not having achieved remission: Secondary | ICD-10-CM | POA: Diagnosis not present

## 2019-03-17 DIAGNOSIS — Z79899 Other long term (current) drug therapy: Secondary | ICD-10-CM | POA: Diagnosis not present

## 2019-03-17 DIAGNOSIS — F419 Anxiety disorder, unspecified: Secondary | ICD-10-CM | POA: Diagnosis not present

## 2019-03-17 DIAGNOSIS — D61818 Other pancytopenia: Secondary | ICD-10-CM | POA: Diagnosis not present

## 2019-03-17 DIAGNOSIS — D696 Thrombocytopenia, unspecified: Secondary | ICD-10-CM

## 2019-03-17 DIAGNOSIS — Z95828 Presence of other vascular implants and grafts: Secondary | ICD-10-CM

## 2019-03-17 DIAGNOSIS — Z85820 Personal history of malignant melanoma of skin: Secondary | ICD-10-CM | POA: Diagnosis not present

## 2019-03-17 DIAGNOSIS — E039 Hypothyroidism, unspecified: Secondary | ICD-10-CM | POA: Diagnosis not present

## 2019-03-17 DIAGNOSIS — D801 Nonfamilial hypogammaglobulinemia: Secondary | ICD-10-CM | POA: Diagnosis not present

## 2019-03-17 DIAGNOSIS — Z791 Long term (current) use of non-steroidal anti-inflammatories (NSAID): Secondary | ICD-10-CM | POA: Diagnosis not present

## 2019-03-17 LAB — CBC WITH DIFFERENTIAL (CANCER CENTER ONLY)
Abs Immature Granulocytes: 0.01 10*3/uL (ref 0.00–0.07)
Basophils Absolute: 0 10*3/uL (ref 0.0–0.1)
Basophils Relative: 0 %
Eosinophils Absolute: 0 10*3/uL (ref 0.0–0.5)
Eosinophils Relative: 0 %
HCT: 26.3 % — ABNORMAL LOW (ref 36.0–46.0)
Hemoglobin: 9 g/dL — ABNORMAL LOW (ref 12.0–15.0)
Immature Granulocytes: 1 %
Lymphocytes Relative: 95 %
Lymphs Abs: 1.6 10*3/uL (ref 0.7–4.0)
MCH: 28.8 pg (ref 26.0–34.0)
MCHC: 34.2 g/dL (ref 30.0–36.0)
MCV: 84 fL (ref 80.0–100.0)
Monocytes Absolute: 0 10*3/uL — ABNORMAL LOW (ref 0.1–1.0)
Monocytes Relative: 1 %
Neutro Abs: 0.1 10*3/uL — CL (ref 1.7–7.7)
Neutrophils Relative %: 3 %
Platelet Count: 18 10*3/uL — ABNORMAL LOW (ref 150–400)
RBC: 3.13 MIL/uL — ABNORMAL LOW (ref 3.87–5.11)
RDW: 12.7 % (ref 11.5–15.5)
WBC Count: 1.8 10*3/uL — ABNORMAL LOW (ref 4.0–10.5)
nRBC: 0 % (ref 0.0–0.2)

## 2019-03-17 LAB — BPAM PLATELET PHERESIS
Blood Product Expiration Date: 202008232359
ISSUE DATE / TIME: 202008220917
Unit Type and Rh: 5100

## 2019-03-17 LAB — PREPARE PLATELET PHERESIS: Unit division: 0

## 2019-03-17 LAB — SAMPLE TO BLOOD BANK

## 2019-03-17 MED ORDER — SODIUM CHLORIDE 0.9% IV SOLUTION
250.0000 mL | Freq: Once | INTRAVENOUS | Status: AC
  Administered 2019-03-17: 250 mL via INTRAVENOUS
  Filled 2019-03-17: qty 250

## 2019-03-17 MED ORDER — HEPARIN SOD (PORK) LOCK FLUSH 100 UNIT/ML IV SOLN
500.0000 [IU] | Freq: Once | INTRAVENOUS | Status: AC | PRN
  Administered 2019-03-17: 250 [IU]
  Filled 2019-03-17: qty 5

## 2019-03-17 MED ORDER — SODIUM CHLORIDE 0.9% FLUSH
10.0000 mL | INTRAVENOUS | Status: DC | PRN
  Administered 2019-03-17: 10 mL
  Filled 2019-03-17: qty 10

## 2019-03-17 MED ORDER — SODIUM CHLORIDE 0.9% FLUSH
3.0000 mL | INTRAVENOUS | Status: DC | PRN
  Filled 2019-03-17: qty 10

## 2019-03-17 NOTE — Unmapped (Signed)
T/C to patient per request of Dr. Lonni Fix to reschedule patient's 9/10 admission to this week. Pt confirmed she could come on Wednesday, 8/26 at 0900 for labs / 1000 for admission. Instructed her to check in on ground floor, go to basement for labs, then proceed to 3rd floor where she will be admitted by the infusion team.  She verbalized understanding.    I told patient she would need to have a PICC line placed prior to admission but she confirmed she already had one in place. I told her I would pass this along to Dr. Lonni Fix. She was appreciative of our assistance.

## 2019-03-17 NOTE — Unmapped (Addendum)
Infusion Scheduling:    Please move patient's 9/10 admission to this Wednesday, 8/26 at 1000 with labs prior. Please make note that Med E1 is preferable as Dr. Lonni Fix is attending and this is her patient. However, if E1 is capped, E3 is acceptable.    Thanks,  Lynard Postlewait

## 2019-03-17 NOTE — Telephone Encounter (Signed)
Called patient to inform patient that her treatment for 8/26 has been added.  Patient is aware of the appt date and time.

## 2019-03-17 NOTE — Telephone Encounter (Signed)
Lab called with critical ANC 0.1. Dr. Benay Spice notified and made aware. No new orders at this time.

## 2019-03-18 LAB — PREPARE PLATELET PHERESIS: Unit division: 0

## 2019-03-18 LAB — BPAM PLATELET PHERESIS
Blood Product Expiration Date: 202008251032
ISSUE DATE / TIME: 202008241418
Unit Type and Rh: 5100

## 2019-03-18 NOTE — Telephone Encounter (Signed)
Oral Oncology Patient Advocate Encounter  I spoke to the patient today, she has stopped the Promacta.  I let [place] know.  [place] Patient [place] Phone [phone] Fax [phone] 03/18/2019   9:59 AM

## 2019-03-19 ENCOUNTER — Ambulatory Visit
Admit: 2019-03-19 | Discharge: 2019-03-22 | Disposition: A | Discharge status: Home / Self Care | Payer: PRIVATE HEALTH INSURANCE | Admitting: Hematology & Oncology

## 2019-03-19 ENCOUNTER — Other Ambulatory Visit
Admit: 2019-03-19 | Discharge: 2019-03-22 | Disposition: A | Discharge status: Home / Self Care | Payer: PRIVATE HEALTH INSURANCE | Admitting: Hematology & Oncology

## 2019-03-19 ENCOUNTER — Other Ambulatory Visit: Payer: BC Managed Care – PPO

## 2019-03-19 ENCOUNTER — Telehealth: Payer: Self-pay | Admitting: Oncology

## 2019-03-19 ENCOUNTER — Ambulatory Visit: Payer: BC Managed Care – PPO

## 2019-03-19 ENCOUNTER — Ambulatory Visit: Payer: BC Managed Care – PPO | Admitting: Nurse Practitioner

## 2019-03-19 DIAGNOSIS — B1910 Unspecified viral hepatitis B without hepatic coma: Secondary | ICD-10-CM | POA: Diagnosis not present

## 2019-03-19 DIAGNOSIS — Z1159 Encounter for screening for other viral diseases: Secondary | ICD-10-CM | POA: Diagnosis not present

## 2019-03-19 DIAGNOSIS — C9110 Chronic lymphocytic leukemia of B-cell type not having achieved remission: Secondary | ICD-10-CM | POA: Diagnosis not present

## 2019-03-19 DIAGNOSIS — Z9221 Personal history of antineoplastic chemotherapy: Secondary | ICD-10-CM | POA: Diagnosis not present

## 2019-03-19 DIAGNOSIS — F329 Major depressive disorder, single episode, unspecified: Secondary | ICD-10-CM | POA: Diagnosis not present

## 2019-03-19 DIAGNOSIS — D611 Drug-induced aplastic anemia: Secondary | ICD-10-CM | POA: Diagnosis not present

## 2019-03-19 DIAGNOSIS — F419 Anxiety disorder, unspecified: Secondary | ICD-10-CM | POA: Diagnosis not present

## 2019-03-19 DIAGNOSIS — D619 Aplastic anemia, unspecified: Secondary | ICD-10-CM | POA: Diagnosis not present

## 2019-03-19 DIAGNOSIS — Z5111 Encounter for antineoplastic chemotherapy: Secondary | ICD-10-CM | POA: Diagnosis not present

## 2019-03-19 DIAGNOSIS — T451X5A Adverse effect of antineoplastic and immunosuppressive drugs, initial encounter: Secondary | ICD-10-CM | POA: Diagnosis not present

## 2019-03-19 DIAGNOSIS — Q998 Other specified chromosome abnormalities: Secondary | ICD-10-CM | POA: Diagnosis not present

## 2019-03-19 DIAGNOSIS — E039 Hypothyroidism, unspecified: Secondary | ICD-10-CM | POA: Diagnosis not present

## 2019-03-19 DIAGNOSIS — G4700 Insomnia, unspecified: Secondary | ICD-10-CM | POA: Diagnosis not present

## 2019-03-19 DIAGNOSIS — Z5112 Encounter for antineoplastic immunotherapy: Secondary | ICD-10-CM | POA: Diagnosis not present

## 2019-03-19 DIAGNOSIS — D61818 Other pancytopenia: Secondary | ICD-10-CM | POA: Diagnosis not present

## 2019-03-19 DIAGNOSIS — D801 Nonfamilial hypogammaglobulinemia: Secondary | ICD-10-CM | POA: Diagnosis not present

## 2019-03-19 LAB — COMPREHENSIVE METABOLIC PANEL
ALBUMIN: 4 g/dL (ref 3.5–5.0)
ALKALINE PHOSPHATASE: 82 U/L (ref 38–126)
ALT (SGPT): 19 U/L (ref <35)
ANION GAP: 8 mmol/L (ref 7–15)
AST (SGOT): 22 U/L (ref 14–38)
BILIRUBIN TOTAL: 0.6 mg/dL (ref 0.0–1.2)
BLOOD UREA NITROGEN: 23 mg/dL — ABNORMAL HIGH (ref 7–21)
BUN / CREAT RATIO: 30
CALCIUM: 9.5 mg/dL (ref 8.5–10.2)
CHLORIDE: 104 mmol/L (ref 98–107)
CREATININE: 0.76 mg/dL (ref 0.60–1.00)
EGFR CKD-EPI AA FEMALE: >90 mL/min/{1.73_m2} (ref >=60)
EGFR CKD-EPI NON-AA FEMALE: 86 mL/min/{1.73_m2} (ref >=60)
GLUCOSE RANDOM: 125 mg/dL (ref 70–179)
POTASSIUM: 3.8 mmol/L (ref 3.5–5.0)
PROTEIN TOTAL: 6.9 g/dL (ref 6.5–8.3)
SODIUM: 136 mmol/L (ref 135–145)

## 2019-03-19 LAB — SMEAR REVIEW

## 2019-03-19 LAB — CBC W/ AUTO DIFF
BASOPHILS ABSOLUTE COUNT: 0 10*9/L (ref 0.0–0.1)
BASOPHILS RELATIVE PERCENT: 0.3 %
EOSINOPHILS ABSOLUTE COUNT: 0 10*9/L (ref 0.0–0.4)
EOSINOPHILS RELATIVE PERCENT: 0.1 %
HEMATOCRIT: 25.3 % — ABNORMAL LOW (ref 36.0–46.0)
HEMOGLOBIN: 8.5 g/dL — ABNORMAL LOW (ref 12.0–16.0)
LARGE UNSTAINED CELLS: 5 % — ABNORMAL HIGH (ref 0–4)
LYMPHOCYTES ABSOLUTE COUNT: 1.3 10*9/L — ABNORMAL LOW (ref 1.5–5.0)
LYMPHOCYTES RELATIVE PERCENT: 90.6 %
MEAN CORPUSCULAR HEMOGLOBIN CONC: 33.4 g/dL (ref 31.0–37.0)
MEAN CORPUSCULAR VOLUME: 85.1 fL (ref 80.0–100.0)
MEAN PLATELET VOLUME: 7.7 fL (ref 7.0–10.0)
MONOCYTES ABSOLUTE COUNT: 0 10*9/L — ABNORMAL LOW (ref 0.2–0.8)
MONOCYTES RELATIVE PERCENT: 0.6 %
NEUTROPHILS ABSOLUTE COUNT: 0.1 10*9/L — CL (ref 2.0–7.5)
NEUTROPHILS RELATIVE PERCENT: 3.4 %
RED BLOOD CELL COUNT: 2.97 10*12/L — ABNORMAL LOW (ref 4.00–5.20)
RED CELL DISTRIBUTION WIDTH: 14.5 % (ref 12.0–15.0)
WBC ADJUSTED: 1.5 10*9/L — ABNORMAL LOW (ref 4.5–11.0)

## 2019-03-19 LAB — ANION GAP: Anion gap 3:SCnc:Pt:Ser/Plas:Qn:: 8

## 2019-03-19 LAB — MEAN CORPUSCULAR VOLUME: Lab: 85.1

## 2019-03-19 MED ORDER — VORICONAZOLE 200 MG TABLET
ORAL_TABLET | Freq: Two times a day (BID) | ORAL | 0 refills | 30.00000 days
Start: 2019-03-19 — End: 2019-03-19

## 2019-03-19 MED ORDER — POSACONAZOLE 100 MG TABLET,DELAYED RELEASE
ORAL_TABLET | Freq: Every day | ORAL | 0 refills | 30.00000 days
Start: 2019-03-19 — End: 2019-03-19

## 2019-03-19 NOTE — Unmapped (Signed)
0900 Pt in lab for labs for pending admission today. PICC line dressing intact and no new dressing needed per protocol. Labs drawn from line. Pt left ambulatory at 0904.

## 2019-03-19 NOTE — Unmapped (Signed)
Hematology Oncology APP Service History and Physical    Assessment/Plan:    Principal Problem:    Aplastic anemia (CMS-HCC)  Active Problems:    Chronic lymphocytic leukemia (CLL), B-cell (CMS-HCC)    Depression      Susan Kane is a 58 y.o. female with PMHx as reviewed in the EMR who presented to [facility] with Aplastic anemia (CMS-HCC).    Aplastic Anemia, hx of CLL and transfusion dependent pancytopenia since start of acalabrutinib for CLL treatment despite CCS and IVIG.  Patient had HA after IVIG infusion which led to ER visit at [facility], diagnosed with asceptic meningitis, has since resolved. Patient is admitted for for hATG + cyclosporine, D1=8/26.  - Continue Amicar for plt support  - Ppx: levaquin, fluconazole, valtrex, bactrim  - [ ]  f/u cyclosporine level    hATG + Cyclosporine, D1=8/26  - hATG x q24hours  4 doses with hydrcortisone q4hours to begin prior to initial hATG as pre-medication  - Cyclosporine 200mg , BID   - Prednisone 80.5mg  q24 hours x 4 doses to start on 8/31, 4 hours after last dose of hydrcoritsone.     Okay to treat placed: 8/26    Dispo:  - Labs with outpatient Oncologist: Dr Myrle Sheng in [place], [place]    CLL, diagnosed August 2010.  Initially treated with ibrutinib however was unable to tolerate due to arthralgias and was changed to acalabrutinib but with bone marrow failure and transfusion dependence.  Acalabrutinib was discontinued and she was placed on Promacta and x1 dose of Rituximab. This resulted in a brief but not long standing response.  BMBx on 8/14 hypocellular bone marrow (20%) with marked reduction of maturing hematopoietic elements. Numerous lymphoid aggregates, consistent with involvement by chronic lymphocytic leukemia, representing ~50% of marrow cellularity by flow cytometry.         Other issues:    Depression/Insomnia/Anxiety.  Patient is on lithium and trintellix and ambien for sleep. Patient takes xanax prn at night, infrequently.  Had been on lamictal but there was concern for bone marrow suppression therefore this was discontinued.  - Continue lithium and trintellix  - Continue ambien for sleep  - Continue xanax prn anxiety    FEN/GI  Regular Diet  Standing orders for potassium and magnesium replacement  IV Fluids per chemotherapy protocol    DVT Prophylaxis: ambulatory    Code Status: full - confirmed with patient upon admission    ___________________________________________________________________    Chief Complaint:  Aplastic anemia (CMS-HCC)    HPI:  Susan Kane is a 58 y.o. 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  She denies fevers,chills, nausea, vomiting, diarrhea.  She has had intermittent, infrequent mild gum bleeding.  Denies epistaxis, hematuria, bruising.  She notes an ER visit after IVIG infusion 2 weeks ago at [facility] at which she was diagnosed with     Otherwise, the patient denies new constitutional symptoms such as anorexia, weight loss, fatigue, night sweats or unexplained fevers.  Furthermore, the patient denies symptoms of marrow failure: unexplained bleeding or bruising, recurrent or unexplained intercurrent infections, dyspnea on exertion, lightheadedness, shortness of breath, palpitations or chest pain.  There have been no new or unexplained pains or self-identified masses, swelling or enlarged lymph nodes.    Review of Systems: A twelve point  review of systems was performed. Pertinent positives are listed above, all others are negative.    Oncology History  Oncology History Overview Note   CLL:    Summary as per Dr. Kalman Drape most recent note with minor edits/additions from my review of records    1.??CLL?????diagnosed in August 2010, flow cytometry consistent with CLL  ?? Enlarged left??inguinal lymph node January 2019,??small??neck/axillary nodes and palpable splenomegaly 09/11/2017  ?? CTs??on 09/17/2017-3 cm necrotic appearing lymph node in the left inguinal region, borderline enlarged pelvic/retroperitoneal, chest, and axillary nodes. ??Mild splenomegaly.  ?? Ultrasound-guided biopsy of the left inguinal lymph node 09/18/2017, slightly purulent fluid aspirated, core biopsy is consistent with an atypical lymphoid proliferation???extensive necrosis with surrounding epithelioid histiocytes, limited intact lymphoid tissue involved with CLL  ?? Incisional biopsy of a necrotic/purulent left inguinal lymph node on 10/01/2017???extensive necrosis with granulomatous inflammation, small amount of viable lymphoid tissue involved with CLL, AFB and fungal stains negative  ?? Peripheral blood FISH analysis 02/05/2018??? +deletion 13q14, no evidence of p53 (17p13) deletion, no evidence of 11q22??deletion  - normal karyotype (46,XX)  ?? Bone marrow biopsy 02/26/2018???hypercellular marrow with extensive involvement by CLL, lymphocytes represent??85% of all cells  ?? Ibrutinib initiated 04/03/2018  ?? Ibrutinib placed on hold 04/11/2018 due to onset of arthralgias  ?? Ibrutinib resumed 04/16/2018, discontinued 04/25/2018 secondary to severe arthralgias/arthritis  ?? Ibrutinib resumed at a dose of 140 mg daily 05/03/2018  ?? Ibrutinib dose adjusted to 140 mg alternating with 280 mg 06/25/2018  ?? 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 ??Augmentin prescribed.  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    Baseline BM bx reviewed at [facility]  Final Diagnosis   (Outside Case #:  [id number], dated 02/26/2018)  Bone marrow, aspiration and biopsy  -  Hypercellular bone marrow (80%) with extensive involvement by chronic lymphocytic leukemia (87% lymphocytes by manual aspirate differential)  (See Comment)  -  By outside report, cytogenetic results are normal     Karyotype: 67, XX and FISH with 13q del but no 11q or 17p del    Repeat BM bx: (done after being off ibrutinib x1 month)  80% involvement by CLL    08/13/18: Presents to [facility] to discuss plan of care     Chronic lymphocytic leukemia (CLL), B-cell (CMS-HCC)   07/12/2018 Initial Diagnosis    Chronic lymphocytic leukemia (CLL), B-cell (CMS-HCC)     08/26/2018 -  Chemotherapy    Acalabrutinib started (approximate date)     11/14/2018 -  Chemotherapy    Acalabrutinib discontinued due to progressive bone marrow failure     12/06/2018 -  Chemotherapy    Ritux x1 given due to CLL still making up majority of BM cellularity (though in the setting of near aplasia of rest of TLH)         Allergies:  Patient has no known allergies.    Medications:   Prior to Admission medications    Medication Dose, Route, Frequency   ALPRAZolam (XANAX) 0.25 MG tablet No dose, route, or frequency recorded.   aminocaproic acid (AMICAR) 500 mg tablet 500 mg, Oral, Daily, 2 tablets every 8 hours (1000mg )   cycloSPORINE modified (NEORAL) 100 MG capsule 200 mg, Oral, 2 times a day (standard)  fluconazole (DIFLUCAN) 100 MG tablet 100 mg, Oral, Daily   folic acid (FOLVITE) 1 MG tablet 1 mg, Oral   lamoTRIgine (LAMICTAL) 200 MG tablet 200 mg, Oral, Daily (standard)   levoFLOXacin (LEVAQUIN) 500 MG tablet 500 mg, Oral, Daily   levonorgestrel (MIRENA) 20 mcg/24 hours (5 yrs) 52 mg IUD 1 each, Intrauterine   levothyroxine (SYNTHROID) 100 MCG tablet 100 mcg, Oral   lithium (ESKALITH CR) 450 MG ER tablet 900 mg, Oral   predniSONE (DELTASONE) 10 MG tablet Begin to taper after completing 2 weeks of full dose steroids. Go to 40 mg daily for 1 weeks, then to 30 mg daily for 1 week, then 20 mg daily for 1 week, then 10 mg daily for 1 week.   PROMACTA 50 mg tablet No dose, route, or frequency recorded.   TRINTELLIX 20 mg tablet 20 mg, Oral, Daily (standard)   valACYclovir (VALTREX) 500 MG tablet 500 mg, Oral, Daily   zolpidem (AMBIEN) 10 mg tablet No dose, route, or frequency recorded.       Medical History:  Past Medical History:   Diagnosis Date   ??? Anxiety    ??? Depression    ??? Hypothyroid        Surgical History:  Past Surgical History:   Procedure Laterality Date   ??? CESAREAN SECTION         Social History:  Social History     Socioeconomic History   ??? Marital status: Not on file     Spouse name: Not on file   ??? Number of children: Not on file   ??? Years of education: Not on file   ??? Highest education level: Not on file   Occupational History   ??? Not on file   Social Needs   ??? Financial resource strain: Not on file   ??? Food insecurity     Worry: Not on file     Inability: Not on file   ??? Transportation needs     Medical: Not on file     Non-medical: Not on file   Tobacco Use   ??? Smoking status: Never Smoker   ??? Smokeless tobacco: Never Used   Substance and Sexual Activity   ??? Alcohol use: Yes     Alcohol/week: 4.0 standard drinks     Types: 4 Glasses of wine per week     Drinks per session: 3 or 4     Binge frequency: Weekly   ??? Drug use: Never   ??? Sexual activity: Not on file   Lifestyle   ??? Physical activity     Days per week: Not on file     Minutes per session: Not on file   ??? Stress: Not on file   Relationships   ??? Social Wellsite geologist on phone: Not on file     Gets together: Not on file     Attends religious service: Not on file     Active member of club or organization: Not on file     Attends meetings of clubs or organizations: Not on file     Relationship status: Not on file   Other Topics Concern   ??? Not on file   Social History Narrative   ??? Not on file       Family History:  Family History   Problem Relation Age of Onset   ??? Breast cancer Mother 35        died in 69s  of another cause   ??? Prostate cancer Father        Review of Systems:  10 systems reviewed and are negative unless otherwise mentioned in HPI    Labs/Studies:  Labs and Studies from the last 24hrs per EMR and Reviewed    Physical Exam:  Overall: Well appearing, NAD  HEENT: PERRL, anicteric sclera, clear conjunctiva, MMM, no oral lesions or oropharyngeal exudate  NECK: supple, full ROM  LYMPH: no palpable cervical, supraclavicular, axillary, or inguinal nodes  RESP: non-labored, bilaterally CTA  CARDIO: RRR, normal S1, S2, no RMGs  GI: soft, round, nontender, active bowel sounds,   EXTREMITIES: no peripheral edema   MSK: No grossly-evident joint effusions or deformities. Range of motion in shoulders, elbows, hips and knees is grossly normal  NEURO: Alert, Ox4; no focal deficits, coordination intact, gait steady  DERM: Warm, dry, no rashes, lesions  PSYCH: appropriate  LINE: LUE PICC,  no erythema, swelling or discharge, dressing CDI        Cruzita Lederer, FNP-BC, [facility]  Nurse Practitioner  Hematology/Oncology  [facility]    Hematology Oncology APP Pager [phone]

## 2019-03-19 NOTE — Telephone Encounter (Signed)
Scheduled appt per 8/25 sch message - pt is aware of appt date and time   

## 2019-03-20 LAB — CBC W/ AUTO DIFF
BASOPHILS ABSOLUTE COUNT: 0 10*9/L (ref 0.0–0.1)
EOSINOPHILS ABSOLUTE COUNT: 0 10*9/L (ref 0.0–0.4)
EOSINOPHILS RELATIVE PERCENT: 0.5 %
HEMATOCRIT: 20.3 % — ABNORMAL LOW (ref 36.0–46.0)
HEMOGLOBIN: 6.9 g/dL — ABNORMAL LOW (ref 12.0–16.0)
LARGE UNSTAINED CELLS: 5 % — ABNORMAL HIGH (ref 0–4)
LYMPHOCYTES ABSOLUTE COUNT: 0.4 10*9/L — ABNORMAL LOW (ref 1.5–5.0)
LYMPHOCYTES RELATIVE PERCENT: 86 %
MEAN CORPUSCULAR HEMOGLOBIN CONC: 34.1 g/dL (ref 31.0–37.0)
MEAN CORPUSCULAR VOLUME: 85.6 fL (ref 80.0–100.0)
MEAN PLATELET VOLUME: 8.4 fL (ref 7.0–10.0)
MONOCYTES ABSOLUTE COUNT: 0 10*9/L — ABNORMAL LOW (ref 0.2–0.8)
MONOCYTES RELATIVE PERCENT: 2 %
NEUTROPHILS ABSOLUTE COUNT: 0 10*9/L — CL (ref 2.0–7.5)
NEUTROPHILS RELATIVE PERCENT: 5.9 %
PLATELET COUNT: 19 10*9/L — ABNORMAL LOW (ref 150–440)
RED BLOOD CELL COUNT: 2.38 10*12/L — ABNORMAL LOW (ref 4.00–5.20)
RED CELL DISTRIBUTION WIDTH: 14 % (ref 12.0–15.0)

## 2019-03-20 LAB — HEPATIC FUNCTION PANEL
ALKALINE PHOSPHATASE: 69 U/L (ref 38–126)
ALT (SGPT): 22 U/L (ref <35)
BILIRUBIN DIRECT: <0.1 mg/dL (ref 0.00–0.40)
BILIRUBIN TOTAL: 0.4 mg/dL (ref 0.0–1.2)
PROTEIN TOTAL: 5.8 g/dL — ABNORMAL LOW (ref 6.5–8.3)

## 2019-03-20 LAB — BASIC METABOLIC PANEL
ANION GAP: 7 mmol/L (ref 7–15)
BLOOD UREA NITROGEN: 24 mg/dL — ABNORMAL HIGH (ref 7–21)
CALCIUM: 9.4 mg/dL (ref 8.5–10.2)
CHLORIDE: 106 mmol/L (ref 98–107)
CO2: 24 mmol/L (ref 22.0–30.0)
CREATININE: 0.62 mg/dL (ref 0.60–1.00)
EGFR CKD-EPI AA FEMALE: >90 mL/min/{1.73_m2} (ref >=60)
EGFR CKD-EPI NON-AA FEMALE: >90 mL/min/{1.73_m2} (ref >=60)
POTASSIUM: 3.8 mmol/L (ref 3.5–5.0)
SODIUM: 137 mmol/L (ref 135–145)

## 2019-03-20 LAB — WBC ADJUSTED: Lab: 0.5 — ABNORMAL LOW

## 2019-03-20 LAB — SODIUM: Sodium:SCnc:Pt:Ser/Plas:Qn:: 137

## 2019-03-20 LAB — AST (SGOT): Aspartate aminotransferase:CCnc:Pt:Ser/Plas:Qn:: 37

## 2019-03-20 LAB — MAGNESIUM: Magnesium:MCnc:Pt:Ser/Plas:Qn:: 1.8

## 2019-03-20 LAB — PHOSPHORUS: Phosphate:MCnc:Pt:Ser/Plas:Qn:: 4.1

## 2019-03-20 MED ORDER — FAMOTIDINE 20 MG/2ML IV SOLN
20.00 | INTRAVENOUS | Status: DC
Start: ? — End: 2019-03-20

## 2019-03-20 MED ORDER — SB BRONCHIAL 12.5-200 MG PO TABS
25.00 | ORAL_TABLET | ORAL | Status: DC
Start: ? — End: 2019-03-20

## 2019-03-20 MED ORDER — K-Y LUBRICATING EX
20.00 | CUTANEOUS | Status: DC
Start: 2019-03-23 — End: 2019-03-20

## 2019-03-20 MED ORDER — Medication
Status: DC
Start: ? — End: 2019-03-20

## 2019-03-20 MED ORDER — SODIUM CHLORIDE 0.9 % IV SOLN
20.00 | INTRAVENOUS | Status: DC
Start: ? — End: 2019-03-20

## 2019-03-20 MED ORDER — MESNA 400 MG PO TABS
100.00 | ORAL_TABLET | ORAL | Status: DC
Start: 2019-03-23 — End: 2019-03-20

## 2019-03-20 MED ORDER — GENERIC EXTERNAL MEDICATION
40.00 | Status: DC
Start: 2019-03-21 — End: 2019-03-20

## 2019-03-20 MED ORDER — POLY-VITAMIN/FLUORIDE/IRON 1-12 MG PO CHEW
1.00 | CHEWABLE_TABLET | ORAL | Status: DC
Start: 2019-03-22 — End: 2019-03-20

## 2019-03-20 MED ORDER — Medication
10.00 | Status: DC
Start: 2019-03-20 — End: 2019-03-20

## 2019-03-20 MED ORDER — POSACONAZOLE 100 MG PO TBEC
300.00 | DELAYED_RELEASE_TABLET | ORAL | Status: DC
Start: 2019-03-22 — End: 2019-03-20

## 2019-03-20 MED ORDER — METHYLPREDNISOLONE SODIUM SUCC 125 MG IJ SOLR
125.00 | INTRAMUSCULAR | Status: DC
Start: ? — End: 2019-03-20

## 2019-03-20 MED ORDER — HYDROCORTISONE NA SUCCINATE PF 100 MG IJ SOLR
100.00 | INTRAMUSCULAR | Status: DC
Start: 2019-03-20 — End: 2019-03-20

## 2019-03-20 MED ORDER — ANTIHISTAMINE DECONGESTANT 6-120 MG OR TB12
0.30 | ORAL_TABLET | ORAL | Status: DC
Start: ? — End: 2019-03-20

## 2019-03-20 MED ORDER — Medication
1.25 | Status: DC
Start: 2019-03-24 — End: 2019-03-20

## 2019-03-20 MED ORDER — MAXIPHEN-G DM 20-30-1000 MG PO TB12
500.00 | ORAL_TABLET | ORAL | Status: DC
Start: 2019-03-23 — End: 2019-03-20

## 2019-03-20 MED ORDER — SODIUM CHLORIDE 0.9 % IV SOLN
1000.00 | INTRAVENOUS | Status: DC
Start: ? — End: 2019-03-20

## 2019-03-20 MED ORDER — KEFLEX 125 MG/5ML PO SUSR
1.00 | ORAL | Status: DC
Start: 2019-03-23 — End: 2019-03-20

## 2019-03-20 MED ORDER — LOPERAMIDE HCL 2 MG PO CAPS
2.00 | ORAL_CAPSULE | ORAL | Status: DC
Start: ? — End: 2019-03-20

## 2019-03-20 MED ORDER — DIPHENHYDRAMINE HCL 50 MG/ML IJ SOLN
50.00 | INTRAMUSCULAR | Status: DC
Start: 2019-03-20 — End: 2019-03-20

## 2019-03-20 MED ORDER — ACETAMINOPHEN 325 MG PO TABS
650.00 | ORAL_TABLET | ORAL | Status: DC
Start: 2019-03-20 — End: 2019-03-20

## 2019-03-20 MED ORDER — Medication
10.00 | Status: DC
Start: 2019-03-22 — End: 2019-03-20

## 2019-03-20 MED ORDER — VORICONAZOLE 200 MG IV SOLR
10.00 | INTRAVENOUS | Status: DC
Start: 2019-03-22 — End: 2019-03-20

## 2019-03-20 MED ORDER — GENERIC EXTERNAL MEDICATION
150.00 | Status: DC
Start: 2019-03-22 — End: 2019-03-20

## 2019-03-20 MED ORDER — SCHOLLS DONUT HEEL CUSHION PADS
900.00 | MEDICATED_PAD | Status: DC
Start: 2019-03-22 — End: 2019-03-20

## 2019-03-20 MED ORDER — LEXIVA PO
1.00 | ORAL | Status: DC
Start: ? — End: 2019-03-20

## 2019-03-20 MED ORDER — ACTION STRIPS MISC
500.00 | Status: DC
Start: 2019-03-23 — End: 2019-03-20

## 2019-03-20 MED ORDER — GRANDPAS INDIAN CORN SOAP EX
500.00 | CUTANEOUS | Status: DC
Start: 2019-03-22 — End: 2019-03-20

## 2019-03-20 MED ORDER — GENERIC EXTERNAL MEDICATION
Status: DC
Start: ? — End: 2019-03-20

## 2019-03-20 MED ORDER — TANNIC-12 60-5-10 MG OR TABS
25.00 | ORAL_TABLET | ORAL | Status: DC
Start: ? — End: 2019-03-20

## 2019-03-20 MED ORDER — BARO-CAT PO
20.00 | ORAL | Status: DC
Start: ? — End: 2019-03-20

## 2019-03-20 MED ORDER — PEDIASURE 1.0 CAL/FIBER PO LIQD
4.00 | ORAL | Status: DC
Start: ? — End: 2019-03-20

## 2019-03-20 MED ORDER — LACTOBACILLUS ACID-PECTIN PO CAPS
0.25 | ORAL_CAPSULE | ORAL | Status: DC
Start: ? — End: 2019-03-20

## 2019-03-20 NOTE — Unmapped (Signed)
Care Management  Initial Transition Planning Assessment              General  Care Manager assessed the patient by : In person interview with patient, Medical record review, Discussion with Clinical Care team  Orientation Level: Oriented X4   CM met with patient in pt room.  Pt/visitors were not wearing hospital provided masks for the duration of the interaction with CM.   CM was wearing hospital provided surgical mask and hospital provided eye protection.  CM was not within 6 foot of the patient/visitors during this interaction.       Contact/Decision Maker  Extended Emergency Contact Information  Primary Emergency Contact: Susan Kane, Susan Kane  Mobile Phone: [phone]  Relation: Spouse    Legal Next of Kin / Guardian / POA / Advance Directives       Advance Directive (Medical Treatment)  Does patient have an advance directive covering medical treatment?: Patient has advance directive covering medical treatment, copy not in chart.(Pt states her daughter Susan Kane is HCPOA)  Advance directive covering medical treatment not in Chart:: Copy requested from family    Health Care Decision Maker [HCDM] (Medical & Mental Health Treatment)  Healthcare Decision Maker: HCDM documented in the HCDM/Contact Info section.  Information offered on HCDM, Medical & Mental Health advance directives:: Other (Comment)(PT  has document at home)    Advance Directive (Mental Health Treatment)  Does patient have an advance directive covering mental health treatment?: Patient has advance directive covering mental health treatment, copy not in chart.  Advance directive covering mental health treatment not in Chart:: Copy requested from family.    Patient Information  Lives with: Spouse/significant other    Type of Residence: Private residence       Type of Residence: Mailing Address:  [address]  [place] [address]  Contacts:    Patient Phone Number: c: [phone]        Medical Provider(s): No PCP Per Patient  Reason for Admission: Admitting Diagnosis:  scheduled chemo for aplastic anemia  Past Medical History:   has a past medical history of Anxiety, Depression, and Hypothyroid.  Past Surgical History:   has a past surgical history that includes Cesarean section.   Previous admit date: N/A    Primary Insurance- Payor: BCBS / Plan: BCBS BLUE OPTIONS/PPO/ADV / Product Type: *No Product type* /   Secondary Insurance ??? None  Prescription Coverage ??? BCBS - through work  Preferred Pharmacy - [id number] - [place], [place] - [address] AT [address] OF [address]  [facility]    Transportation home: Private vehicle  Level of function prior to admission: Independent             Support Systems: Family Members, Spouse, Children    Responsibilities/Dependents at home?: No    Home Care services in place prior to admission?: No           Pt has had PICC line for almost three months, it is completely managed through her provider in [place].  They flush it three times a week and do dressing changes.  She plans to resume that schedule.       Equipment Currently Used at Home: none       Currently receiving outpatient dialysis?: No       Financial Information       Need for financial assistance?: No(pt and husband work)       Social Determinants of Health  Social History      Food insecurity     Worry: Never true     Inability: Never true     Discharge Needs Assessment  Concerns to be Addressed: denies needs/concerns at this time    Clinical Risk Factors:      Barriers to taking medications: No    Prior overnight hospital stay or ED visit in last 90 days: No    Readmission Within the Last 30 Days: no previous admission in last 30 days         Anticipated Changes Related to Illness: none    Equipment Needed After Discharge: none    Discharge Facility/Level of Care Needs: other (see comments)(home)    Readmission  Risk of Unplanned Readmission Score: UNPLANNED READMISSION SCORE: 16%  Predictive Model Details           16% (Medium) Factors Contributing to Score   Calculated 03/20/2019 09:26 38% Number of active Rx orders is 47   Robinson Risk of Unplanned Readmission Model 12% Diagnosis of cancer is present     11% Active antipsychotic Rx order is present     9% Latest BUN is high (24 mg/dL)     7% Latest hemoglobin is low (6.9 g/dL)     7% Phosphorous result is present     6% Age is 89     5% Active corticosteroid Rx order is present     Readmitted Within the Last 30 Days? (No if blank)   Patient at risk for readmission?: No    Discharge Plan  Screen findings are: Care Manager reviewed the plan of the patient's care with the Multidisciplinary Team. No discharge planning needs identified at this time. Care Manager will continue to manage plan and monitor patient's progress with the team.    Expected Discharge Date: 03/23/2019      Patient and/or family were provided with choice of facilities / services that are available and appropriate to meet post hospital care needs?: N/A       Initial Assessment complete?: Yes

## 2019-03-20 NOTE — Unmapped (Addendum)
Pt alert and oriented. VSS, no falls and afebrile.  Pt complained of an uncomfortable feeling in her chest, lightheadedness, blurred vision, and muffled distant sounds after administration of pre-meds of IV benadryl and hydrocortisone sod succ.  Symptoms resolved within 15 minutes.  Pt reacted 1 hour into ATGAM infusion with severe rigors.  Infusion stopped, HCP notified and demerol given with good results.  HCP called attending, per order infusion was re-started at 1/2 the original dose/rate.  Patient tolerated it well at the new reduced rate.  No other acute events overnight. Pt denies pain.  Pt would like to start her ATGAM early afternoon today if possible.  Pt remains on room air with good results.  Safety measures remain in place with bed alarm on after ambien given.  Will continue to monitor.    Vitals:    03/20/19 0235 03/20/19 0305 03/20/19 0405 03/20/19 0506   BP: 105/62 111/59 110/58 104/61   Pulse: 100 100 88 92   Resp: 18 18  18    Temp: 37.4 ??C (99.3 ??F) 37.6 ??C (99.7 ??F) 36.2 ??C (97.2 ??F) 35.8 ??C (96.5 ??F)   TempSrc: Oral Oral Oral Oral   SpO2: 97% 98% 97% 99%   Weight:       Height:            Problem: Adult Inpatient Plan of Care  Goal: Plan of Care Review  Outcome: Progressing  Flowsheets (Taken 03/20/2019 1610)  Progress: no change  Plan of Care Reviewed With:  ??? patient  ??? spouse  Goal: Patient-Specific Goal (Individualization)  Outcome: Progressing  Flowsheets (Taken 03/20/2019 0558)  Patient-Specific Goals (Include Timeframe): Patient would like to remain free from further reactions during the rest of her treatment.  Individualized Care Needs: Cluster care, monitor vitals and provide medication as needed for reactions.  Anxieties, Fears or Concerns: Patient is concerned about possibly reacting to treatment.  Goal: Absence of Hospital-Acquired Illness or Injury  Outcome: Progressing  Intervention: Identify and Manage Fall Risk  Note:   environmental modification; fall reduction program maintained; family at bedside; lighting adjusted for tasks/safety; low bed; neutropenic precautions; nonskid shoes/slippers when out of bed; room near unit station; no IV/BP/blood draw left arm; supervised activity; toileting scheduled  Intervention: Prevent Skin Injury  Flowsheets (Taken 03/20/2019 0558)  Pressure Reduction Techniques: frequent weight shift encouraged  Intervention: Prevent VTE (venous thromboembolism)  Flowsheets (Taken 03/20/2019 9604)  VTE Prevention/Management:  ??? ambulation promoted  ??? bleeding precautions maintained  ??? bleeding risk factors identified  ??? dorsiflexion/plantar flexion performed  ??? fluids promoted  Intervention: Prevent Infection  Flowsheets (Taken 03/20/2019 0558)  Infection Prevention:  ??? cohorting utilized  ??? handwashing promoted  ??? personal protective equipment utilized  ??? rest/sleep promoted  ??? visitors restricted/screened  ??? single patient room provided  Goal: Optimal Comfort and Wellbeing  Outcome: Progressing  Intervention: Monitor Pain and Promote Comfort  Flowsheets (Taken 03/20/2019 0558)  Pain Management Interventions:  ??? care clustered  ??? pain management plan reviewed with patient/caregiver  ??? quiet environment facilitated  Intervention: Provide Person-Centered Care  Flowsheets (Taken 03/20/2019 0558)  Trust Relationship/Rapport:  ??? care explained  ??? choices provided  ??? emotional support provided  ??? empathic listening provided  ??? questions answered  ??? questions encouraged  ??? reassurance provided  ??? thoughts/feelings acknowledged  Goal: Readiness for Transition of Care  Outcome: Progressing  Goal: Rounds/Family Conference  Outcome: Progressing

## 2019-03-20 NOTE — Unmapped (Signed)
Pt arrived from infusion clinic this evening, she is alert and oriented X4, afebrile vital signs stable. No complains of pain. Covid test obtained, will start hATG at 2030.  Pt free of falls or injuries. Will continue to monitor.        Problem: Adult Inpatient Plan of Care  Goal: Plan of Care Review  Outcome: Ongoing - Unchanged  Goal: Patient-Specific Goal (Individualization)  Outcome: Ongoing - Unchanged  Goal: Absence of Hospital-Acquired Illness or Injury  Outcome: Ongoing - Unchanged  Goal: Optimal Comfort and Wellbeing  Outcome: Ongoing - Unchanged  Goal: Readiness for Transition of Care  Outcome: Ongoing - Unchanged  Goal: Rounds/Family Conference  Outcome: Ongoing - Unchanged

## 2019-03-20 NOTE — Unmapped (Signed)
To begin to day #2 of ATG at 1700. Daughter into visit. Up in room w/steady gait. No falls or injuries. Monitoring.   Problem: Adult Inpatient Plan of Care  Goal: Plan of Care Review  Outcome: Progressing  Goal: Patient-Specific Goal (Individualization)  Outcome: Progressing  Goal: Absence of Hospital-Acquired Illness or Injury  Outcome: Progressing  Goal: Optimal Comfort and Wellbeing  Outcome: Progressing  Goal: Readiness for Transition of Care  Outcome: Progressing  Goal: Rounds/Family Conference  Outcome: Progressing     Problem: Adult Inpatient Plan of Care  Goal: Plan of Care Review  Outcome: Progressing  Goal: Patient-Specific Goal (Individualization)  Outcome: Progressing  Goal: Absence of Hospital-Acquired Illness or Injury  Outcome: Progressing  Goal: Optimal Comfort and Wellbeing  Outcome: Progressing  Goal: Readiness for Transition of Care  Outcome: Progressing  Goal: Rounds/Family Conference  Outcome: Progressing

## 2019-03-20 NOTE — Unmapped (Signed)
Hematology/Oncology APP Progress Note:     Admit Date: 03/19/2019   Today's Date: 03/20/2019      Attending Physician :  Windell Norfolk Zeidner,*  Primary Oncologist: Jennell Corner, MD  Reason for Admission: Aplastic Anemia, hATG + cyclosporine    Assessment/Plan:   Susan Kane is a 58 y.o. female with PMHx as reviewed in the EMR who presented to [facility] with Aplastic anemia here for hATG, cyclosporine    Summary of Today's Plan:  D2=8/27 hATG+ cyclosporine, transfusion hypersensitivity reaction overnight with rigors, improved with demerol, restarted at 1/2 dose without further reaction.  Infusion time moved up today, will start at 1/2 rate and titrate up as tolerated. 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 Valtrex, levaquin, posa, bactrim ppx. F/u requested.    Patient Active Problem List   Diagnosis   ??? Chronic lymphocytic leukemia (CLL), B-cell (CMS-HCC)   ??? Hypogammaglobulinemia (CMS-HCC)   ??? Chronic lymphocytic leukemia of B-cell type not having achieved remission (CMS-HCC)   ??? Aplastic anemia (CMS-HCC)   ??? Depression       Aplastic Anemia, hx of CLL and transfusion dependent pancytopenia since start of acalabrutinib for CLL treatment despite CCS and IVIG.  Patient had HA after IVIG infusion which led to ER visit at [facility], diagnosed with asceptic meningitis, has since resolved. Parvo IgG positive, IgM nevative, PNH < 1%. 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 Patient is admitted for for hATG + cyclosporine, D1=8/26.  - Continue Amicar for plt support  - Ppx: levaquin, posa, valtrex, bactrim  - [ ]  f/u cyclosporine level  ??  hATG + Cyclosporine, D1=8/26  - hATG x q24hours  4 doses with hydrcortisone q4hours to begin prior to initial hATG as pre-medication  - Cyclosporine 200mg , BID   - Prednisone 80.5mg  q24 hours x 4 doses to start on 8/31, 4 hours after last dose of hydrcoritsone.   ??  Okay to treat placed: 8/26  ??  Dispo: Discharge Sunday PM/Monday AM  - Labs with outpatient Oncologist: Dr Myrle Sheng in [place], [place] -requested 3 times week beginning 9/1.  Cyclosporine level requested at local oncologist on 9/3  - F/u with Kendal Hymen, CPP requested 9/8 to f/u cyclosporine level  - Continue Valtrex, levaquin, posa, bactrim ppx  - F/u with Callie or Sean requested for 1-2 weeks  ??  CLL, diagnosed August 2010.  Initially treated with??ibrutinib however was unable to tolerate due to arthralgias and was changed to acalabrutinib but with bone marrow failure and transfusion dependence.  Acalabrutinib was discontinued and she was placed on Promacta and x1 dose of Rituximab. This resulted in a brief but not long standing response.  BMBx on 8/14??hypocellular bone marrow (20%) with marked reduction of maturing hematopoietic elements.??Numerous lymphoid aggregates, consistent with involvement by chronic lymphocytic leukemia, representing ~50% of marrow cellularity by flow cytometry.   ??  ??  ??  Other issues:  ??  Depression/Insomnia/Anxiety.  Patient is on lithium and trintellix and ambien for sleep. Patient takes xanax prn at night, infrequently.  Had been on lamictal but there was concern for bone marrow suppression therefore this was discontinued.  - Continue lithium and trintellix  - Continue ambien for sleep  - Continue xanax prn anxiety      FEN: reg diet  Standing electrolyte replacement panel for potassium  and magnesium    Prophy: ambulation    Code Status: Full Code      Subjective/24hr events: Patient with hypersensitivity reaction to hATG overnight. Felt better with demerol for rigors and when rate decreased to 1/2 dose.     Review of Systems:  12 point ROS otherwise negative except as above in the HPI.       Objective:  Temp:  [35.8 ??C (96.4 ??F)-37.8 ??C (100.1 ??F)] 36.1 ??C (97 ??F)  Heart Rate:  [64-105] 64  Resp:  [16-20] 16 BP: (100-135)/(52-74) 119/69  MAP (mmHg):  [71-99] 99  SpO2:  [97 %-100 %] 98 %  BMI (Calculated):  [22.61] 22.61    Intake/Output this shift:    Intake/Output Summary (Last 24 hours) at 03/20/2019 1656  Last data filed at 03/20/2019 1600  Gross per 24 hour   Intake 2963 ml   Output ???   Net 2963 ml       Weight:  Wt Readings from Last 1 Encounters:   03/19/19 63.5 kg (139 lb 15.9 oz)       Weight change:     Physical Exam:  GENERAL:  HEENT: PERRL, anicteric sclera, clear conjunctiva, MMM, no oral lesions or oropharyngeal exudate  LYMPH: No palpable cervical nodes  RESP: non-labored, bilaterally CTA  CARDIO: RRR, normal S1,S2; no RMGs  GI: abdomen is soft, round, active bowel sounds, nontender, no palpable masses  EXTREMITIES: well perfused, warm, no peripheral edema  NEURO: Alert, Ox4, cerebellar movements fluid, no focal deficits  PSYCH: appropriate  MSK: No grossly-evident joint effusions or deformities. Range of motion in shoulders, elbows, hips and knees is grossly normal.  DERM: warm, dry, no rashes or lesions    LINE: LUE PICC line, no swelling, erythema, or discharge; dressing CDI      Lab Trends:   Recent Labs     03/19/19  0902 03/20/19  0417   WBC 1.5* 0.5*   NEUTROABS 0.1* 0.0*   HGB 8.5* 6.9*   HCT 25.3* 20.3*   PLT 20* 19*   CREATININE 0.76 0.62   BUN 23* 24*   BILITOT 0.6 0.4   AST 22 37   ALT 19 22   ALKPHOS 82 69   K 3.8 3.8   MG  --  1.8   CALCIUM 9.5 9.4   NA 136 137   CL 104 106   CO2 24.0 24.0         Imaging: no new imaging today    Medications:   Scheduled Meds:  ??? acetaminophen  650 mg Oral BID   ??? aminocaproic acid  500 mg Oral Daily   ??? cycloSPORINE modified  150 mg Oral BID   ??? diphenhydrAMINE  50 mg Intravenous BID   ??? folic acid  1 mg Oral Daily   ??? hydrocortisone sod succ  100 mg Intravenous Q4H   ??? levoFLOXacin  500 mg Oral Daily   ??? levothyroxine  100 mcg Oral daily   ??? lithium  900 mg Oral Nightly   ??? lymphocyte immune globulin equine (ATGAM) IVPB  40 mg/kg (Treatment Plan Recorded) Intravenous Q24H   ??? posaconazole  300 mg Oral Q24H   ??? [START ON 03/24/2019] predniSONE  1.25 mg/kg/day (Treatment Plan Recorded) Oral Q24H   ??? sodium chloride  10 mL Intravenous BID   ??? sodium chloride  10 mL Intravenous BID   ??? sodium chloride  10 mL Intravenous BID   ??? [START ON 03/22/2019] sulfamethoxazole-trimethoprim  1 tablet  Oral BID   ??? valACYclovir  500 mg Oral BID   ??? vortioxetine  20 mg Oral Daily   ??? zolpidem  10 mg Oral Nightly     Continuous Infusions:  ??? Chemo Clarification Order     ??? IP okay to treat     ??? sodium chloride     ??? sodium chloride       PRN Meds:.ALPRAZolam, Chemo Clarification Order, diphenhydrAMINE, emollient combination no.92, EPINEPHrine IM, famotidine (PEPCID) IV, IP okay to treat, loperamide, loperamide, meperidine, methylPREDNISolone sodium succinate (PF), sodium chloride, sodium chloride 0.9%      Cruzita Lederer, FNP-BC, [facility]  Nurse Practitioner  Hematology/Oncology  [phone]    03/20/19

## 2019-03-21 ENCOUNTER — Telehealth: Payer: Self-pay | Admitting: *Deleted

## 2019-03-21 ENCOUNTER — Inpatient Hospital Stay: Payer: BC Managed Care – PPO

## 2019-03-21 ENCOUNTER — Other Ambulatory Visit: Payer: Self-pay | Admitting: Nurse Practitioner

## 2019-03-21 DIAGNOSIS — C9110 Chronic lymphocytic leukemia of B-cell type not having achieved remission: Secondary | ICD-10-CM

## 2019-03-21 LAB — CBC W/ AUTO DIFF
BASOPHILS ABSOLUTE COUNT: 0 10*9/L (ref 0.0–0.1)
BASOPHILS RELATIVE PERCENT: 0 %
EOSINOPHILS ABSOLUTE COUNT: 0 10*9/L (ref 0.0–0.4)
EOSINOPHILS RELATIVE PERCENT: 1.1 %
HEMATOCRIT: 22.5 % — ABNORMAL LOW (ref 36.0–46.0)
HEMOGLOBIN: 7.8 g/dL — ABNORMAL LOW (ref 12.0–16.0)
LARGE UNSTAINED CELLS: 4 % (ref 0–4)
LYMPHOCYTES RELATIVE PERCENT: 78.5 %
MEAN CORPUSCULAR HEMOGLOBIN CONC: 34.6 g/dL (ref 31.0–37.0)
MEAN CORPUSCULAR HEMOGLOBIN: 29.5 pg (ref 26.0–34.0)
MEAN CORPUSCULAR VOLUME: 85.4 fL (ref 80.0–100.0)
MEAN PLATELET VOLUME: 7.5 fL (ref 7.0–10.0)
MONOCYTES ABSOLUTE COUNT: 0 10*9/L — ABNORMAL LOW (ref 0.2–0.8)
MONOCYTES RELATIVE PERCENT: 2.8 %
NEUTROPHILS RELATIVE PERCENT: 13.4 %
PLATELET COUNT: 6 10*9/L — CL (ref 150–440)
RED BLOOD CELL COUNT: 2.63 10*12/L — ABNORMAL LOW (ref 4.00–5.20)
RED CELL DISTRIBUTION WIDTH: 13.9 % (ref 12.0–15.0)
WBC ADJUSTED: 0.3 10*9/L — CL (ref 4.5–11.0)

## 2019-03-21 LAB — PHOSPHORUS: Phosphate:MCnc:Pt:Ser/Plas:Qn:: 3.1

## 2019-03-21 LAB — BASIC METABOLIC PANEL
ANION GAP: 7 mmol/L (ref 7–15)
BLOOD UREA NITROGEN: 20 mg/dL (ref 7–21)
CALCIUM: 9.2 mg/dL (ref 8.5–10.2)
CHLORIDE: 112 mmol/L — ABNORMAL HIGH (ref 98–107)
CO2: 25 mmol/L (ref 22.0–30.0)
CREATININE: 0.53 mg/dL — ABNORMAL LOW (ref 0.60–1.00)
EGFR CKD-EPI AA FEMALE: >90 mL/min/{1.73_m2} (ref >=60)
EGFR CKD-EPI NON-AA FEMALE: >90 mL/min/{1.73_m2} (ref >=60)
GLUCOSE RANDOM: 170 mg/dL (ref 70–179)
SODIUM: 144 mmol/L (ref 135–145)

## 2019-03-21 LAB — PLATELET COUNT: Platelets:NCnc:Pt:Bld:Qn:Automated count: 16 — ABNORMAL LOW

## 2019-03-21 LAB — HEPATIC FUNCTION PANEL
ALKALINE PHOSPHATASE: 67 U/L (ref 38–126)
AST (SGOT): 124 U/L — ABNORMAL HIGH (ref 14–38)
BILIRUBIN DIRECT: <0.1 mg/dL (ref 0.00–0.40)
BILIRUBIN TOTAL: 0.5 mg/dL (ref 0.0–1.2)
PROTEIN TOTAL: 5.8 g/dL — ABNORMAL LOW (ref 6.5–8.3)

## 2019-03-21 LAB — MAGNESIUM: Magnesium:MCnc:Pt:Ser/Plas:Qn:: 1.9

## 2019-03-21 LAB — CYCLOSPORINE (FPIA) BLOOD: Lab: 74

## 2019-03-21 LAB — EGFR CKD-EPI AA FEMALE: Lab: >90

## 2019-03-21 LAB — ALBUMIN: Albumin:MCnc:Pt:Ser/Plas:Qn:: 3.3 — ABNORMAL LOW

## 2019-03-21 LAB — MEAN CORPUSCULAR HEMOGLOBIN: Lab: 29.5

## 2019-03-21 MED ORDER — POSACONAZOLE 100 MG TABLET,DELAYED RELEASE
ORAL_TABLET | ORAL | 2 refills | 30.00000 days | Status: CP
Start: 2019-03-21 — End: ?
  Filled 2019-03-22: qty 90, 30d supply, fill #0

## 2019-03-21 MED ORDER — CALCIUM CARBONATE 200 MG CALCIUM (500 MG) CHEWABLE TABLET
Freq: Three times a day (TID) | ORAL | 0 refills | 0.00000 days | PRN
Start: 2019-03-21 — End: 2019-04-20

## 2019-03-21 MED ORDER — CYCLOSPORINE MODIFIED 100 MG CAPSULE
ORAL_CAPSULE | Freq: Two times a day (BID) | ORAL | 2 refills | 30 days | Status: CP
Start: 2019-03-21 — End: ?
  Filled 2019-03-22: qty 120, 30d supply, fill #0

## 2019-03-21 MED ORDER — CYCLOSPORINE MODIFIED 25 MG CAPSULE
ORAL_CAPSULE | Freq: Two times a day (BID) | ORAL | 2 refills | 30.00000 days | Status: CP
Start: 2019-03-21 — End: ?
  Filled 2019-03-22: qty 120, 30d supply, fill #0

## 2019-03-21 NOTE — Unmapped (Addendum)
Cyclosporine Therapeutic Monitoring Pharmacy Note    Susan Kane is a 58 y.o. female continuing cyclosporine NEORAL.     Indication: Severe aplastic anemia      Prior Dosing Information: Current regimen 150 mg twice daily      Goals:  Therapeutic Drug Levels  Cyclosporine trough goal: 200-400 ng/mL    Additional Clinical Monitoring/Outcomes  ? Monitor renal function (SCr and urine output) and liver function (LFTs)  ? Monitor for signs/symptoms of adverse events (e.g., hyperglycemia, hyperkalemia, hypomagnesemia, hypertension, headache, tremor)    Results:   Cyclosporine level: 74 ng/mL, drawn early as level was only drawn after 3 doses . Level was drawn preemptively early due to DDI concerns with posaconazole.    Pharmacokinetic Considerations and Significant Drug Interactions:  ? Concurrent hepatotoxic medications: bactrim  ? Concurrent CYP3A4 substrates/inhibitors: posaconazole  ? Concurrent nephrotoxic medications: None identified    Assessment/Plan:  Recommendation(s)  ? Continue current regimen of 150 mg BID. If level is still below < 100 on Sunday, consider going up to 200 mg in the morning and 150 mg at night.     Follow-up  ? Next level should be ordered on 8/30  at 0800.   ? A pharmacist will continue to monitor and recommend levels as appropriate    Please page service pharmacist with questions/clarifications.    Sharmon Revere, PharmD

## 2019-03-21 NOTE — Unmapped (Signed)
Hematology/Oncology APP Progress Note:     Admit Date: 03/19/2019   Today's Date: 03/21/2019      Attending Physician :  Windell Norfolk Zeidner,*  Primary Oncologist: Jennell Corner, MD  Reason for Admission: Aplastic Anemia, hATG + cyclosporine    Assessment/Plan:   Susan Kane is a 58 y.o. female with PMHx as reviewed in the EMR who presented to [facility] with Aplastic anemia here for hATG, cyclosporine    Summary of Today's Plan:  D2=8/27 hATG+ cyclosporine, transfusion hypersensitivity reaction overnight with rigors, improved with demerol, restarted at 1/2 dose without further reaction.  Infusion time moved up today, will start at 1/2 rate and titrate up as tolerated. 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 Valtrex, levaquin, posa, bactrim ppx. F/u requested.    Patient Active Problem List   Diagnosis   ??? Chronic lymphocytic leukemia (CLL), B-cell (CMS-HCC)   ??? Hypogammaglobulinemia (CMS-HCC)   ??? Chronic lymphocytic leukemia of B-cell type not having achieved remission (CMS-HCC)   ??? Aplastic anemia (CMS-HCC)   ??? Depression       Aplastic Anemia, hx of CLL and transfusion dependent pancytopenia since start of acalabrutinib for CLL treatment despite CCS and IVIG.  Patient had HA after IVIG infusion which led to ER visit at [facility], diagnosed with asceptic meningitis, has since resolved. Parvo IgG positive, IgM nevative, PNH < 1%. 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 Patient is admitted for for hATG + cyclosporine, D1=8/26.  - Continue Amicar for plt support (gum bleeding)  - Ppx: levaquin, posa, valtrex, bactrim  - [ ]  f/u cyclosporine level  ??  hATG + Cyclosporine, D1=8/26  - hATG x q24hours  4 doses with hydrcortisone q4hours to begin prior to initial hATG as pre-medication  - Cyclosporine 200mg , BID   - Prednisone 80.5mg  q24 hours x 4 doses to start on 8/31, 4 hours after last dose of hydrcoritsone.   ??  Okay to treat placed: 8/26  ??  Dispo: Discharge Saturday PM  - Labs with outpatient Oncologist: Dr Myrle Sheng in [place], [place] - 3x weekly labs starting 8/31.  Cyclosporine level requested at local oncologist and Dr [name] will f/u and make adjustments accordingly.   - F/u with Kendal Hymen, CPP requested 9/8 to f/u cyclosporine level  - Continue Valtrex, levaquin, posa, bactrim ppx  - F/u with Gregary Signs on 9/9  ??  CLL, diagnosed August 2010.  Initially treated with??ibrutinib however was unable to tolerate due to arthralgias and was changed to acalabrutinib but with bone marrow failure and transfusion dependence.  Acalabrutinib was discontinued and she was placed on Promacta and x1 dose of Rituximab. This resulted in a brief but not long standing response.  BMBx on 8/14??hypocellular bone marrow (20%) with marked reduction of maturing hematopoietic elements.??Numerous lymphoid aggregates, consistent with involvement by chronic lymphocytic leukemia, representing ~50% of marrow cellularity by flow cytometry.   ??  ??  ??  Other issues:  ??  Depression/Insomnia/Anxiety.  Patient is on lithium and trintellix and ambien for sleep. Patient takes xanax prn at night, infrequently.  Had been on lamictal but there was concern for bone marrow suppression therefore this was discontinued.  - Continue lithium and trintellix  - Continue ambien for sleep  - Continue xanax prn anxiety      FEN: reg diet  Standing  electrolyte replacement panel for potassium and magnesium    Prophy: ambulation    Code Status: Full Code      Subjective/24hr events: Patient tolerated hATG infusion well overnight at half dose.  Will move up administration time today and will infuse at full dose, tolerating well.  Denies HA, N/V/diarrhea this AM.      Review of Systems:  12 point ROS otherwise negative except as above in the HPI.       Objective:  Temp:  [35.6 ??C (96.1 ??F)-36.9 ??C (98.5 ??F)] 36.7 ??C (98.1 ??F)  Heart Rate:  [54-75] 63  Resp:  [16-18] 16  BP: (111-134)/(56-75) 124/63  MAP (mmHg):  [83-99] 88  SpO2:  [98 %-100 %] 100 %    Intake/Output this shift:    Intake/Output Summary (Last 24 hours) at 03/21/2019 1536  Last data filed at 03/20/2019 1600  Gross per 24 hour   Intake 360 ml   Output ???   Net 360 ml       Weight:  Wt Readings from Last 1 Encounters:   03/20/19 65.8 kg (145 lb 1.6 oz)       Weight change: 2.517 kg (5 lb 8.8 oz)    Physical Exam:  GENERAL: well appearing, NAD  HEENT: PERRL, anicteric sclera, clear conjunctiva, MMM, no oral lesions or oropharyngeal exudate  LYMPH: No palpable cervical nodes  RESP: non-labored, bilaterally CTA  CARDIO: RRR, normal S1,S2; no RMGs  GI: abdomen is soft, round, active bowel sounds, nontender, no palpable masses  EXTREMITIES: well perfused, warm, no peripheral edema  NEURO: Alert, Ox4, cerebellar movements fluid, no focal deficits  PSYCH: appropriate  MSK: No grossly-evident joint effusions or deformities. Range of motion in shoulders, elbows, hips and knees is grossly normal.  DERM: warm, dry, no rashes or lesions    LINE: LUE PICC line, no swelling, erythema, or discharge; dressing CDI      Lab Trends:   Recent Labs     03/19/19  0902 03/20/19  0417 03/21/19  0401 03/21/19  0642   WBC 1.5* 0.5* 0.3*  --    NEUTROABS 0.1* 0.0* 0.0*  --    HGB 8.5* 6.9* 7.8*  --    HCT 25.3* 20.3* 22.5*  --    PLT 20* 19* 6* 16*   CREATININE 0.76 0.62 0.53*  --    BUN 23* 24* 20  --    BILITOT 0.6 0.4 0.5  --    AST 22 37 124*  --    ALT 19 22 83*  --    ALKPHOS 82 69 67  --    K 3.8 3.8 3.5  --    MG  --  1.8 1.9  --    CALCIUM 9.5 9.4 9.2  --    NA 136 137 144  --    CL 104 106 112*  --    CO2 24.0 24.0 25.0  --          Imaging: no new imaging today    Medications:   Scheduled Meds:  ??? acetaminophen  650 mg Oral BID   ??? aminocaproic acid  500 mg Oral Daily   ??? cycloSPORINE modified  150 mg Oral BID   ??? diphenhydrAMINE  50 mg Intravenous BID   ??? famotidine  20 mg Oral BID   ??? folic acid  1 mg Oral Daily   ??? hydrocortisone sod succ  100 mg Intravenous Q4H   ??? levoFLOXacin  500 mg Oral  Daily   ??? levothyroxine  100 mcg Oral daily   ??? lithium  900 mg Oral Nightly   ??? lymphocyte immune globulin equine (ATGAM) IVPB  40 mg/kg (Treatment Plan Recorded) Intravenous Q24H   ??? posaconazole  300 mg Oral Q24H   ??? [START ON 03/24/2019] predniSONE  1.25 mg/kg/day (Treatment Plan Recorded) Oral Q24H   ??? sodium chloride  10 mL Intravenous BID   ??? sodium chloride  10 mL Intravenous BID   ??? sodium chloride  10 mL Intravenous BID   ??? [START ON 03/22/2019] sulfamethoxazole-trimethoprim  1 tablet Oral BID   ??? valACYclovir  500 mg Oral BID   ??? vortioxetine  20 mg Oral Daily   ??? zolpidem  10 mg Oral Nightly     Continuous Infusions:  ??? Chemo Clarification Order     ??? IP okay to treat     ??? sodium chloride     ??? sodium chloride       PRN Meds:.ALPRAZolam, calcium carbonate, Chemo Clarification Order, diphenhydrAMINE, emollient combination no.92, EPINEPHrine IM, famotidine (PEPCID) IV, IP okay to treat, loperamide, loperamide, meperidine, methylPREDNISolone sodium succinate (PF), sodium chloride, sodium chloride 0.9%      Cruzita Lederer, FNP-BC, [facility]  Nurse Practitioner  Hematology/Oncology  [phone]    03/21/19

## 2019-03-21 NOTE — Unmapped (Signed)
Pt tolereated IV ATGAM well. No adverse reactions noted. VSS, afebrile. Pt resting quietly overnight. Call bell in reach.

## 2019-03-21 NOTE — Telephone Encounter (Signed)
Faxed appt calendar for 03/24/19 to Resident Assistant Office @ [facility] . Phone     [phone]  [place]     ;      Fax      [phone].

## 2019-03-22 LAB — CBC W/ AUTO DIFF
BASOPHILS ABSOLUTE COUNT: 0 10*9/L (ref 0.0–0.1)
BASOPHILS RELATIVE PERCENT: 0 %
EOSINOPHILS ABSOLUTE COUNT: 0 10*9/L (ref 0.0–0.4)
HEMATOCRIT: 20.3 % — ABNORMAL LOW (ref 36.0–46.0)
HEMOGLOBIN: 7 g/dL — ABNORMAL LOW (ref 12.0–16.0)
LARGE UNSTAINED CELLS: 3 % (ref 0–4)
LYMPHOCYTES ABSOLUTE COUNT: 0.2 10*9/L — ABNORMAL LOW (ref 1.5–5.0)
LYMPHOCYTES RELATIVE PERCENT: 85.5 %
MEAN CORPUSCULAR HEMOGLOBIN CONC: 34.5 g/dL (ref 31.0–37.0)
MEAN CORPUSCULAR HEMOGLOBIN: 29.4 pg (ref 26.0–34.0)
MEAN PLATELET VOLUME: 8.5 fL (ref 7.0–10.0)
MONOCYTES ABSOLUTE COUNT: 0 10*9/L — ABNORMAL LOW (ref 0.2–0.8)
MONOCYTES RELATIVE PERCENT: 0 %
NEUTROPHILS ABSOLUTE COUNT: 0 10*9/L — CL (ref 2.0–7.5)
NEUTROPHILS RELATIVE PERCENT: 10.7 %
PLATELET COUNT: 10 10*9/L — ABNORMAL LOW (ref 150–440)
RED BLOOD CELL COUNT: 2.38 10*12/L — ABNORMAL LOW (ref 4.00–5.20)
RED CELL DISTRIBUTION WIDTH: 14.3 % (ref 12.0–15.0)
WBC ADJUSTED: 0.2 10*9/L — CL (ref 4.5–11.0)

## 2019-03-22 LAB — HEPATIC FUNCTION PANEL
ALBUMIN: 3 g/dL — ABNORMAL LOW (ref 3.5–5.0)
ALKALINE PHOSPHATASE: 64 U/L (ref 38–126)
AST (SGOT): 71 U/L — ABNORMAL HIGH (ref 14–38)
BILIRUBIN DIRECT: <0.1 mg/dL (ref 0.00–0.40)
BILIRUBIN TOTAL: 0.5 mg/dL (ref 0.0–1.2)
PROTEIN TOTAL: 5.6 g/dL — ABNORMAL LOW (ref 6.5–8.3)

## 2019-03-22 LAB — BASIC METABOLIC PANEL
ANION GAP: 3 mmol/L — ABNORMAL LOW (ref 7–15)
BLOOD UREA NITROGEN: 18 mg/dL (ref 7–21)
BUN / CREAT RATIO: 30
CALCIUM: 9 mg/dL (ref 8.5–10.2)
CHLORIDE: 112 mmol/L — ABNORMAL HIGH (ref 98–107)
CO2: 26 mmol/L (ref 22.0–30.0)
EGFR CKD-EPI AA FEMALE: >90 mL/min/{1.73_m2} (ref >=60)
EGFR CKD-EPI NON-AA FEMALE: >90 mL/min/{1.73_m2} (ref >=60)
GLUCOSE RANDOM: 177 mg/dL (ref 70–179)
POTASSIUM: 3.3 mmol/L — ABNORMAL LOW (ref 3.5–5.0)
SODIUM: 141 mmol/L (ref 135–145)

## 2019-03-22 LAB — ALT (SGPT): Alanine aminotransferase:CCnc:Pt:Ser/Plas:Qn:: 79 — ABNORMAL HIGH

## 2019-03-22 LAB — MAGNESIUM: Magnesium:MCnc:Pt:Ser/Plas:Qn:: 1.9

## 2019-03-22 LAB — NEUTROPHILS RELATIVE PERCENT: Neutrophils/100 leukocytes:NFr:Pt:Bld:Qn:Automated count: 10.7

## 2019-03-22 LAB — PHOSPHORUS: Phosphate:MCnc:Pt:Ser/Plas:Qn:: 2.3 — ABNORMAL LOW

## 2019-03-22 LAB — ANION GAP: Anion gap 3:SCnc:Pt:Ser/Plas:Qn:: 3 — ABNORMAL LOW

## 2019-03-22 LAB — PLATELET COUNT: Platelets:NCnc:Pt:Bld:Qn:Automated count: 22 — ABNORMAL LOW

## 2019-03-22 LAB — SMEAR REVIEW

## 2019-03-22 MED ORDER — SULFAMETHOXAZOLE 800 MG-TRIMETHOPRIM 160 MG TABLET
ORAL_TABLET | Freq: Two times a day (BID) | ORAL | 0 refills | 28 days | Status: CP
Start: 2019-03-22 — End: 2019-04-21
  Filled 2019-03-22: qty 16, 28d supply, fill #0

## 2019-03-22 MED ORDER — PREDNISONE 20 MG TABLET
ORAL_TABLET | ORAL | 0 refills | 23 days | Status: CP
Start: 2019-03-22 — End: 2019-04-14
  Filled 2019-03-22: qty 38, 23d supply, fill #0

## 2019-03-22 MED FILL — SULFAMETHOXAZOLE 800 MG-TRIMETHOPRIM 160 MG TABLET: 28 days supply | Qty: 16 | Fill #0 | Status: AC

## 2019-03-22 MED FILL — CYCLOSPORINE MODIFIED 100 MG CAPSULE: 30 days supply | Qty: 120 | Fill #0 | Status: AC

## 2019-03-22 MED FILL — POSACONAZOLE 100 MG TABLET,DELAYED RELEASE: 30 days supply | Qty: 90 | Fill #0 | Status: AC

## 2019-03-22 MED FILL — CYCLOSPORINE MODIFIED 25 MG CAPSULE: 30 days supply | Qty: 120 | Fill #0 | Status: AC

## 2019-03-22 MED FILL — PREDNISONE 20 MG TABLET: 23 days supply | Qty: 38 | Fill #0 | Status: AC

## 2019-03-22 NOTE — Unmapped (Signed)
Susan Kane is alert and oriented x4, afebrile with baseline bradycardia, VS otherwise stable. Given 2 units pRBC for Hgb 7.0, threshold >8 prior to discharge. Pt with no c/o pain. Given PRN tums x1. Pt with active bowel and good urine output. Up ad lib and independent. Fall precautions and pt safety maintained. Emotional and educational support provided. Will continue to monitor.     Problem: Adult Inpatient Plan of Care  Goal: Plan of Care Review  Outcome: Ongoing - Unchanged  Goal: Patient-Specific Goal (Individualization)  Outcome: Ongoing - Unchanged  Goal: Absence of Hospital-Acquired Illness or Injury  Outcome: Ongoing - Unchanged  Goal: Optimal Comfort and Wellbeing  Outcome: Ongoing - Unchanged  Goal: Readiness for Transition of Care  Outcome: Ongoing - Unchanged  Goal: Rounds/Family Conference  Outcome: Ongoing - Unchanged     Problem: Infection  Goal: Infection Symptom Resolution  Outcome: Ongoing - Unchanged

## 2019-03-22 NOTE — Unmapped (Signed)
VSS with no falls or injuries. Patient is alert, oriented, and mostly independent. No complaints of pain; given tums x1 for heartburn w/ good response. Given D3 ATGam at full rate; tolerated well. Plan to d/c tomorrow after ATGam infusion. Daughter at bedside. Will CTM.     Problem: Adult Inpatient Plan of Care  Goal: Plan of Care Review  Outcome: Ongoing - Unchanged  Goal: Patient-Specific Goal (Individualization)  Outcome: Ongoing - Unchanged  Goal: Absence of Hospital-Acquired Illness or Injury  Outcome: Ongoing - Unchanged  Goal: Optimal Comfort and Wellbeing  Outcome: Ongoing - Unchanged  Goal: Readiness for Transition of Care  Outcome: Ongoing - Unchanged  Goal: Rounds/Family Conference  Outcome: Ongoing - Unchanged

## 2019-03-22 NOTE — Unmapped (Signed)
VSS with no falls or injuries. No complaints of pain. Given tums x1 for indigestion with good response. Got one unit of platelets; recheck at 22; tolerated well. Finished ATGam D4 at 1400; no s/s hypersensitivity. Provided extensive education on medication regimen, infection/bleeding prevention, and follow-up. Husband at bedside for discharge teaching. Will CTM pending departure.     Problem: Adult Inpatient Plan of Care  Goal: Plan of Care Review  Outcome: Discharged to Home  Goal: Patient-Specific Goal (Individualization)  Outcome: Discharged to Home  Goal: Absence of Hospital-Acquired Illness or Injury  Outcome: Discharged to Home  Goal: Optimal Comfort and Wellbeing  Outcome: Discharged to Home  Goal: Readiness for Transition of Care  Outcome: Discharged to Home  Goal: Rounds/Family Conference  Outcome: Discharged to Home

## 2019-03-22 NOTE — Unmapped (Signed)
Physician Discharge Summary [facility]  4 ONC [facility]  [address]  [place] [address]  Dept: [phone]  Loc: [phone]     Identifying Information:   Susan Kane  March 20, 1961  [id number]    Primary Care Physician: Thora Lance, MD     Referring Physician: Leta Speller Coo*     Code Status: Full Code    Admit Date: 03/19/2019    Discharge Date: 03/22/2019     Discharge To: Home    Discharge Service: [facility] - MDE - Hematology APP     Discharge Attending Physician: Avie Arenas, MD    Discharge Diagnoses:  Principal Problem:    Aplastic anemia (CMS-HCC)  Active Problems:    Chronic lymphocytic leukemia (CLL), B-cell (CMS-HCC)    Depression  Resolved Problems:    * No resolved hospital problems. *      Outpatient Provider Follow Up Issues:   Supportive Care Recommendations:  We recommend based on the patient???s underlying diagnosis and treatment history the following supportive care:    1. Antimicrobial prophylaxis:  Other: Aplastic Anemia on chemo - Levaquin, Valtrex, Posaconazole and Bactrim.     2. Blood product support:  Leukoreduced blood products are required.  Irradiated blood products are preferred, but in case of urgent transfusion needs non-irradiated blood products may be used:     -  RBC transfusion threshold: transfuse 2 units for Hgb < 8 g/dL.  -  Platelet transfusion threshold: transfuse 1 unit of platelets for platelet count < 10, or for bleeding or need for invasive procedure.    Based on the patient's disease status and intensity of therapy, complete blood count with differential should be evaluated 3 times per week and used to guide transfusion support    3. Hematopoietic growth factor support: none    Hospital Course:   Susan Kaneis a 58 y.o.??female??with PMHx as reviewed in the EMR who presented to [facility] with Aplastic anemia here for hATG, cyclosporine. She had a mild transfusion reaction upon initiation. The doses were adjusted. She tolerated treatment well. She finished her last dose at full strength with no complications. She will discharge home continuing her Cyclosporine. She will have labs drawn at her local oncologist's office. She will also continue a Prednisone taper. She will start Posaconazole. She has follow up with Kendal Hymen, CPP on 9/8 and with [name], NP on 9/9. Her further detailed hospital course is below.     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 ??Patient had HA after IVIG infusion which led to ER visit at [facility], diagnosed with asceptic meningitis, has since resolved. Parvo IgG positive, IgM nevative, PNH < 1%. 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 Patient is admitted for for hATG + cyclosporine, D1=8/26. She developed symptoms of a transfusion reaction on D1. She was given Demerol and symptoms resolved. She received pre meds per protocol. Rate was reduced by 1/2 and tolerated treatment. She was able to finish doses at full rates with no further issues. She was given Amicar for gum bleeding. She will continue ppx meds upon discharge. She will continue a prednisone taper upon discharge. She has follow up labs locally 3x weekly starting 8/31. She will follow up at [facility] with Kendal Hymen, CPP on 9/8 and [name], NP on 9/9.   ??  CLL, diagnosed August 2010. ??Initially treated with??ibrutinib??however was unable to tolerate due to arthralgias and was changed to acalabrutinib but with bone marrow failure and transfusion dependence.????Acalabrutinib was discontinued and she was placed on Promacta??and??x1 dose of Rituximab. This resulted in a brief but not long standing??response. ??BMBx on 8/14??hypocellular bone marrow (20%) with marked reduction of maturing hematopoietic elements.??Numerous lymphoid aggregates, consistent with involvement by chronic lymphocytic leukemia, representing ~50% of marrow cellularity by flow cytometry.   ??  Depression/Insomnia/Anxiety. ??Patient is on lithium and trintellix and ambien for sleep. Patient takes xanax prn at night, infrequently. ??Had been on lamictal but there was concern for bone marrow suppression therefore this was discontinued. She continued her Lithium, Trintellix, Ambien and Xanax PRN while inpatient. She will continue her home regimen upon discharge.     ??    Procedures:  None  No admission procedures for hospital encounter.  ______________________________________________________________________  Discharge Medications:     Your Medication List      STOP taking these medications    fluconazole 100 MG tablet  Commonly known as: DIFLUCAN     lamoTRIgine 200 MG tablet  Commonly known as: LaMICtal     PROMACTA 50 MG tablet  Generic drug: eltrombopag        START taking these medications    calcium carbonate 200 mg calcium (500 mg) chewable tablet  Commonly known as: TUMS  Chew 1 tablet (200 mg elem calcium total) Three (3) times a day as needed for heartburn.     posaconazole 100 mg Tbec delayed released tablet  Commonly known as: NOXAFIL  Take  3 tablets (300 mg) by mouth daily.     sulfamethoxazole-trimethoprim 800-160 mg per tablet  Commonly known as: BACTRIM DS  Take 1 tablet (160 mg of trimethoprim total) by mouth 2 times a day on Saturday, Sunday.        CHANGE how you take these medications    cycloSPORINE modified 100 MG capsule  Commonly known as: NEORAL  Take 2 capsules (200 mg total) by mouth Two (2) times a day.  What changed: Another medication with the same name was added. Make sure you understand how and when to take each.     cycloSPORINE modified 25 MG capsule  Commonly known as: NEORAL  Take 2 capsules (50 mg total) by mouth Two (2) times a day.  What changed: You were already taking a medication with the same name, and this prescription was added. Make sure you understand how and when to take each. cycloSPORINE modified 100 MG capsule  Commonly known as: NEORAL  Take 2 capsules (200 mg total) by mouth Two (2) times a day.  What changed: You were already taking a medication with the same name, and this prescription was added. Make sure you understand how and when to take each.     predniSONE 20 MG tablet  Commonly known as: DELTASONE  Take 4 tablets (80 mg total) by mouth daily for 3 days, THEN 3 tablets (60 mg total) daily for 3 days, THEN 2 tablets (40 mg total) daily for 3 days, THEN 1 tablet (20 mg total) daily for 7 days, THEN 0.5 tablets (10 mg total) daily for 7 days.  Start taking on: March 22, 2019  What changed:   ?? medication strength  ?? See the new instructions.        CONTINUE taking these medications    ALPRAZolam 0.25 MG tablet  Commonly known as: XANAX     aminocaproic acid  500 mg tablet  Commonly known as: AMICAR  Take 500 mg by mouth daily. 2 tablets every 8 hours (1000mg )     folic acid 1 MG tablet  Commonly known as: FOLVITE  Take 1 mg by mouth.     levoFLOXacin 500 MG tablet  Commonly known as: LEVAQUIN  Take 500 mg by mouth daily.     levonorgestreL 20 mcg/24 hours (5 yrs) 52 mg IUD  Commonly known as: MIRENA  1 each by Intrauterine route.     levothyroxine 100 MCG tablet  Commonly known as: SYNTHROID  Take 100 mcg by mouth.     lithium 450 MG ER tablet  Commonly known as: ESKALITH CR  Take 900 mg by mouth.     TRINTELLIX 20 mg tablet  Generic drug: vortioxetine  Take 20 mg by mouth daily.     valACYclovir 500 MG tablet  Commonly known as: VALTREX  Take 500 mg by mouth daily.     zolpidem 10 mg tablet  Commonly known as: AMBIEN            Allergies:  Patient has no known allergies.  ______________________________________________________________________  Pending Test Results (if blank, then none):      Most Recent Labs:  All lab results last 24 hours -   Recent Results (from the past 24 hour(s))   Prepare RBC    Collection Time: 03/21/19  7:02 PM   Result Value Ref Range    Crossmatch Compatible     Unit Blood Type O Pos     ISBT Number 5100     Unit # [id number]     Status Transfused     Product ID Red Blood Cells     PRODUCT CODE [id number]    Hepatic Function Panel    Collection Time: 03/22/19 12:18 AM   Result Value Ref Range    Albumin 3.0 (L) 3.5 - 5.0 g/dL    Total Protein 5.6 (L) 6.5 - 8.3 g/dL    Total Bilirubin 0.5 0.0 - 1.2 mg/dL    Bilirubin, Direct <2.72 0.00 - 0.40 mg/dL    AST 71 (H) 14 - 38 U/L    ALT 79 (H) <35 U/L    Alkaline Phosphatase 64 38 - 126 U/L   Magnesium Level    Collection Time: 03/22/19 12:18 AM   Result Value Ref Range    Magnesium 1.9 1.6 - 2.2 mg/dL   Phosphorus Level    Collection Time: 03/22/19 12:18 AM   Result Value Ref Range    Phosphorus 2.3 (L) 2.9 - 4.7 mg/dL   Basic Metabolic Panel    Collection Time: 03/22/19 12:18 AM   Result Value Ref Range    Sodium 141 135 - 145 mmol/L    Potassium 3.3 (L) 3.5 - 5.0 mmol/L    Chloride 112 (H) 98 - 107 mmol/L    CO2 26.0 22.0 - 30.0 mmol/L    Anion Gap 3 (L) 7 - 15 mmol/L    BUN 18 7 - 21 mg/dL    Creatinine 5.36 6.44 - 1.00 mg/dL    BUN/Creatinine Ratio 30     EGFR CKD-EPI Non-African American, Female >90 >=60 mL/min/1.58m2    EGFR CKD-EPI African American, Female >90 >=60 mL/min/1.24m2    Glucose 177 70 - 179 mg/dL    Calcium 9.0 8.5 - 03.4 mg/dL   CBC w/ Differential    Collection Time: 03/22/19 12:18 AM   Result Value Ref Range    WBC 0.2 (  LL) 4.5 - 11.0 10*9/L    RBC 2.38 (L) 4.00 - 5.20 10*12/L    HGB 7.0 (L) 12.0 - 16.0 g/dL    HCT 09.8 (L) 11.9 - 46.0 %    MCV 85.4 80.0 - 100.0 fL    MCH 29.4 26.0 - 34.0 pg    MCHC 34.5 31.0 - 37.0 g/dL    RDW 14.7 82.9 - 56.2 %    MPV 8.5 7.0 - 10.0 fL    Platelet 10 (L) 150 - 440 10*9/L    Neutrophils % 10.7 %    Lymphocytes % 85.5 %    Monocytes % 0.0 %    Eosinophils % 0.6 %    Basophils % 0.0 %    Absolute Neutrophils 0.0 (LL) 2.0 - 7.5 10*9/L    Absolute Lymphocytes 0.2 (L) 1.5 - 5.0 10*9/L    Absolute Monocytes 0.0 (L) 0.2 - 0.8 10*9/L    Absolute Eosinophils 0.0 0.0 - 0.4 10*9/L    Absolute Basophils 0.0 0.0 - 0.1 10*9/L    Large Unstained Cells 3 0 - 4 %   Morphology Review    Collection Time: 03/22/19 12:18 AM   Result Value Ref Range    Smear Review Comments See Comment (A) Undefined   Type and Screen    Collection Time: 03/22/19 12:21 AM   Result Value Ref Range    ABO Grouping O POS     Antibody Screen NEG    Prepare RBC    Collection Time: 03/22/19  4:05 AM   Result Value Ref Range    Crossmatch Compatible     Unit Blood Type O Pos     ISBT Number 5100     Unit # [id number]     Status Issued     Spec Expiration [id number]     Product ID Red Blood Cells     PRODUCT CODE [id number]     Crossmatch Compatible     Unit Blood Type O Pos     ISBT Number 5100     Unit # [id number]     Status Issued     Spec Expiration [id number]     Product ID Red Blood Cells     PRODUCT CODE [id number]    Prepare Platelet Pheresis    Collection Time: 03/22/19  7:00 AM   Result Value Ref Range    Unit Blood Type B Pos     ISBT Number 7300     Unit # [id number]     Status Transfused     Product ID Platelets     PRODUCT CODE [id number]    Prepare Platelet Pheresis    Collection Time: 03/22/19  7:34 AM   Result Value Ref Range    Unit Blood Type O Pos     ISBT Number 5100     Unit # [id number]     Status Issued     Product ID Platelets     PRODUCT CODE [id number]    Platelet Count    Collection Time: 03/22/19  9:31 AM   Result Value Ref Range    Platelet 22 (L) 150 - 440 10*9/L       Relevant Studies/Radiology (if blank, then none):  No results found.  ______________________________________________________________________  Discharge Instructions:   Please make sure you have a functioning thermometer at home.  If you are feeling poorly, especially if you have chills, shaking, muscle aches or lightheadedness, measure your temperature. If it is more than 100.5 Farenheit,  call the nurse triage line during daytime hours (Monday through Friday 8AM???5PM: [phone]) or on nights and weekends, the on-call doctor by calling the hospital operator [phone]) and asking for the on-call adult oncologist. Alternatively, since fever after chemotherapy may be a medical emergency, you may proceed directly to your local emergency room. Inform your provider that you recently received chemotherapy. You may have blood drawn for blood cultures and receive IV antibiotics.    Following discharge from the hospital if you notice the development or worsening of any symptoms such as nausea, vomiting, chest pain, shortness of breath, fevers, or chills, please return to the emergency department.      If you develop these symptoms, or if you have trouble obtaining any of your medications you may call the [facility] Communication Center to speak with the triage team at [phone] if Monday through Friday 8am-5pm or call [phone] after hours.      For appointments & questions Monday through Friday 8 AM??? 5 PM   please call [phone] or Toll free [phone].    On Nights, Weekends and Holidays  Call [phone] and ask for the oncologist on call.    [facility]  [address]  [place], [place] [address]  [email or web address]       Activity Instructions     Activity as tolerated            Diet Instructions     Discharge diet (specify)      Discharge Nutrition Therapy: General          Other Instructions     Call MD for:  difficulty breathing, headache or visual disturbances      Call MD for:  hives      Call MD for:  persistent dizziness or light-headedness      Call MD for:  persistent nausea or vomiting      Call MD for:  redness, tenderness, or signs of infection (pain, swelling, redness, odor or green/yellow discharge around incision site)      Call MD for:  severe uncontrolled pain      Call MD for:  vaginal bleeding saturating more than 1 pad per hour.      Discharge instructions      Your current cyclosporine dose is 150 mg (one 100 mg capsule + two 25 mg capsules) by mouth in the morning and 150 mg  (one 100 mg capsule + two 25 mg capsules) by mouth in the evening.   The dose listed on the bottle is higher because we prescribed a higher amount of capsules in case your dose needs to be adjusted in the future.     For cyclosporine and posaconazole refills, you can use [facility] [place] specialty mail order pharmacy or another of your choice. Call one week before you run out of medication to arrange delivery of refill or transfer prescription.     [facility] (Specialty Mail Order Pharmacy)  Phone: [phone]         Discharge instructions      Susan Kane, you have been hospitalized for treatment of your treatment related Aplastic Anemia. You received chemotherapy and tolerated treatment well. You are ready for discharge. You will have local follow up of your Labs on 8/31 at your local oncologist's office. You have follow up at [facility] on 9/9 with [name], NP. You are being sent home with a short supply of Cyclosporine until you receive your medication in the mail.  You are also continuing a Prednisone taper over the next few weeks. The details of this are laid out in this paperwork. You are switching your Fluconazole to Posaconazole upon discharge. You will take 300 mg by mouth once daily. Otherwise you can continue your home medications as prescribed.  If you have any questions or concerns you can call the phone numbers provided at the bottom of this paperwork.     In Summary:   You are scheduled to have your labs checked on 8/31 at your local oncologist's office, Dr. [name].  You will continue labs 3x weekly for possible transfusions.   You are scheduled to see [name], NP on 9/9 at [facility].     Please make sure you have a functioning thermometer at home.?? If you are feeling poorly, especially if you have shaking chills, muscle aches or lightheadedness, measure your temperature. If it is more than 100.4 F, call the [facility] nurse triage line during daytime hours (Monday through Friday 8AM-5PM) at [phone]. If it's a night/weekend, please call  the hospital operator at [phone] and ask for the on-call adult oncologist. Since fever after chemotherapy may be a medical emergency, if you are unable to reach anyone at the above numbers, please proceed directly to your closest emergency room. Inform your provider that you recently received chemotherapy. You may have blood drawn for blood cultures and receive IV antibiotics.    In addition, if any of the following conditions develop or worsen, please call 911 or go to the closest emergency department:     Chest pain  Shortness of breath  Unexplained bleeding/bruising  Severe headache  Nausea or vomiting unrelieved with anti-nausea medications     If you develop any other concerning symptoms prior to your next appointment, please go to your nearest urgent care location.       For questions about other symptoms after discharge, or appointment information please call:    Monday - Friday (8 am - 5 pm)  [facility] at [phone] or toll-free at [phone].      Nights, Weekends and [employer] at [phone] and ask for the oncologist on call.    [facility] address:  [address]  [place], [place] [address]      Your blood counts may continue to stay low, so it is good to adhere to the following precautions:    - Wash your hands routinely with soap and water  - Take your temperature when you have chills or are not feeling well  - Use a soft toothbrush  - Avoid constipation or straining with bowel movements. This may mean you occasionally need to take over-the-counter stool softeners or laxatives.   - Avoid people who have colds or the flu, or are not feeling well.  - Wear a mask when visiting crowded places.  - Avoid anyone who has received a live vaccination (shot) within the last three weeks  - Maintain a well-balanced diet and eat healthy foods, but avoid raw or uncooked foods; wash and peel all raw fruits and vegetables  - Speak with your doctor before having any dental work done  - Limit exposure to pet feces (e.g., litter box)  - Do only as much activity as you can tolerate    Other instructions:  - Don't use dental floss if your platelet count is below 50,000. Your doctor or nurse should tell you if this is the case.  - Use any mouthwashes given  to you as directed.  - If you can't tolerate regular brushing, use an oral swab (bristle-less) toothbrush, or use salt and baking soda to clean your mouth. Mix 1 teaspoon of salt and 1 teaspoon of baking soda into an 8-ounce glass of warm water. Swish and spit.  - Watch your mouth and tongue for white patches. This is a sign of fungal infection, a common side effect of chemotherapy. Be sure to tell your doctor about these patches. Medication/mouthwashes can be prescribed to help you fight the fungal infection.               Follow Up instructions and Outpatient Referrals     Call MD for:  difficulty breathing, headache or visual disturbances      Call MD for:  hives      Call MD for:  persistent dizziness or light-headedness      Call MD for:  persistent nausea or vomiting      Call MD for:  redness, tenderness, or signs of infection (pain, swelling, redness, odor or green/yellow discharge around incision site)      Call MD for:  severe uncontrolled pain      Call MD for:  vaginal bleeding saturating more than 1 pad per hour.      Discharge instructions      Discharge instructions            Appointments which have been scheduled for you    Apr 01, 2019 11:00 AM  (Arrive by 10:30 AM)  VIDEO VISIT- OTHER with Malva Cogan, CPP  [place] [facility]ND [facility] REGION) [address]  [place] [address]  [phone]      Apr 02, 2019  9:30 AM  (Arrive by 9:00 AM)  LAB ONLY [place] with ADULT ONC LAB  [facility] ADULT ONCOLOGY LAB DRAW STATION [facility] REGION) [address]  [place] [address]  [phone]      Apr 02, 2019 10:30 AM  (Arrive by 10:00 AM)  RETURN ACTIVE [place] with Lenon Ahmadi, [place]  [place] [facility] 2ND [facility] REGION) [address]  [place] [address]  [phone]           ______________________________________________________________________  Discharge Day Services:  BP 157/78  - Pulse (!) 48  - Temp 36.7 ??C (98 ??F) (Oral)  - Resp 16  - Ht 167.6 cm (5' 6)  - Wt 71.2 kg (156 lb 15.5 oz)  - SpO2 99%  - BMI 25.34 kg/m??   Pt seen on the day of discharge and determined appropriate for discharge.    Condition at Discharge: good    Length of Discharge: I spent greater than 30 mins in the discharge of this patient.      Hinda Glatter, PA-C   Hematology/Oncology Department   [employer]   Pager: [phone]

## 2019-03-22 NOTE — Unmapped (Signed)
Susan Kaneis a 58 y.o.??female??with PMHx as reviewed in the EMR who presented to [facility] with Aplastic anemia here for hATG, cyclosporine. She had a mild transfusion reaction upon initiation. The doses were adjusted. She tolerated treatment well. She finished her last dose at full strength with no complications. She will discharge home continuing her Cyclosporine. She will have labs drawn at her local oncologist's office. She will also continue a Prednisone taper. She will start Posaconazole. She has follow up with Kendal Hymen, CPP on 9/8 and with [name], NP on 9/9. Her further detailed hospital course is below.     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 ??Patient had HA after IVIG infusion which led to ER visit at [facility], diagnosed with asceptic meningitis, has since resolved. Parvo IgG positive, IgM nevative, PNH < 1%. 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 Patient is admitted for for hATG + cyclosporine, D1=8/26. She developed symptoms of a transfusion reaction on D1. She was given Demerol and symptoms resolved. She received pre meds per protocol. Rate was reduced by 1/2 and tolerated treatment. She was able to finish doses at full rates with no further issues. She was given Amicar for gum bleeding. She will continue ppx meds upon discharge. She will continue a prednisone taper upon discharge. She has follow up labs locally 3x weekly starting 8/31. She will follow up at [facility] with Kendal Hymen, CPP on 9/8 and [name], NP on 9/9.   ??  CLL, diagnosed August 2010. ??Initially treated with??ibrutinib??however was unable to tolerate due to arthralgias and was changed to acalabrutinib but with bone marrow failure and transfusion dependence.????Acalabrutinib was discontinued and she was placed on Promacta??and??x1 dose of Rituximab. This resulted in a brief but not long standing??response. ??BMBx on 8/14??hypocellular bone marrow (20%) with marked reduction of maturing hematopoietic elements.??Numerous lymphoid aggregates, consistent with involvement by chronic lymphocytic leukemia, representing ~50% of marrow cellularity by flow cytometry.   ??  Depression/Insomnia/Anxiety. ??Patient is on lithium and trintellix and ambien for sleep. Patient takes xanax prn at night, infrequently. ??Had been on lamictal but there was concern for bone marrow suppression therefore this was discontinued. She continued her Lithium, Trintellix, Ambien and Xanax PRN while inpatient. She will continue her home regimen upon discharge.

## 2019-03-24 ENCOUNTER — Inpatient Hospital Stay (HOSPITAL_BASED_OUTPATIENT_CLINIC_OR_DEPARTMENT_OTHER): Payer: BC Managed Care – PPO | Admitting: Nurse Practitioner

## 2019-03-24 ENCOUNTER — Inpatient Hospital Stay: Payer: BC Managed Care – PPO

## 2019-03-24 ENCOUNTER — Other Ambulatory Visit: Payer: Self-pay

## 2019-03-24 ENCOUNTER — Encounter: Payer: Self-pay | Admitting: Nurse Practitioner

## 2019-03-24 VITALS — BP 99/67 | HR 100 | Temp 98.3°F | Resp 18 | Ht 66.0 in | Wt 141.1 lb

## 2019-03-24 DIAGNOSIS — Z95828 Presence of other vascular implants and grafts: Secondary | ICD-10-CM

## 2019-03-24 DIAGNOSIS — Z79899 Other long term (current) drug therapy: Secondary | ICD-10-CM | POA: Diagnosis not present

## 2019-03-24 DIAGNOSIS — D649 Anemia, unspecified: Secondary | ICD-10-CM

## 2019-03-24 DIAGNOSIS — C9110 Chronic lymphocytic leukemia of B-cell type not having achieved remission: Secondary | ICD-10-CM | POA: Diagnosis not present

## 2019-03-24 DIAGNOSIS — D696 Thrombocytopenia, unspecified: Secondary | ICD-10-CM

## 2019-03-24 DIAGNOSIS — D801 Nonfamilial hypogammaglobulinemia: Secondary | ICD-10-CM | POA: Diagnosis not present

## 2019-03-24 DIAGNOSIS — D61818 Other pancytopenia: Secondary | ICD-10-CM | POA: Diagnosis not present

## 2019-03-24 DIAGNOSIS — Z791 Long term (current) use of non-steroidal anti-inflammatories (NSAID): Secondary | ICD-10-CM | POA: Diagnosis not present

## 2019-03-24 DIAGNOSIS — Z85820 Personal history of malignant melanoma of skin: Secondary | ICD-10-CM | POA: Diagnosis not present

## 2019-03-24 DIAGNOSIS — F419 Anxiety disorder, unspecified: Secondary | ICD-10-CM | POA: Diagnosis not present

## 2019-03-24 DIAGNOSIS — E039 Hypothyroidism, unspecified: Secondary | ICD-10-CM | POA: Diagnosis not present

## 2019-03-24 LAB — CBC WITH DIFFERENTIAL (CANCER CENTER ONLY)
Abs Immature Granulocytes: 0 10*3/uL (ref 0.00–0.07)
Basophils Absolute: 0 10*3/uL (ref 0.0–0.1)
Basophils Relative: 0 %
Eosinophils Absolute: 0 10*3/uL (ref 0.0–0.5)
Eosinophils Relative: 0 %
HCT: 33.7 % — ABNORMAL LOW (ref 36.0–46.0)
Hemoglobin: 11.8 g/dL — ABNORMAL LOW (ref 12.0–15.0)
Immature Granulocytes: 0 %
Lymphocytes Relative: 99 %
Lymphs Abs: 3.4 10*3/uL (ref 0.7–4.0)
MCH: 28.2 pg (ref 26.0–34.0)
MCHC: 35 g/dL (ref 30.0–36.0)
MCV: 80.6 fL (ref 80.0–100.0)
Monocytes Absolute: 0 10*3/uL — ABNORMAL LOW (ref 0.1–1.0)
Monocytes Relative: 0 %
Neutro Abs: 0 10*3/uL — CL (ref 1.7–7.7)
Neutrophils Relative %: 1 %
Platelet Count: 12 10*3/uL — ABNORMAL LOW (ref 150–400)
RBC: 4.18 MIL/uL (ref 3.87–5.11)
RDW: 12.8 % (ref 11.5–15.5)
WBC Count: 3.4 10*3/uL — ABNORMAL LOW (ref 4.0–10.5)
nRBC: 0 % (ref 0.0–0.2)

## 2019-03-24 LAB — CMP (CANCER CENTER ONLY)
ALT: 155 U/L — ABNORMAL HIGH (ref 0–44)
AST: 45 U/L — ABNORMAL HIGH (ref 15–41)
Albumin: 3.9 g/dL (ref 3.5–5.0)
Alkaline Phosphatase: 99 U/L (ref 38–126)
Anion gap: 11 (ref 5–15)
BUN: 31 mg/dL — ABNORMAL HIGH (ref 6–20)
CO2: 25 mmol/L (ref 22–32)
Calcium: 9.7 mg/dL (ref 8.9–10.3)
Chloride: 104 mmol/L (ref 98–111)
Creatinine: 1.02 mg/dL — ABNORMAL HIGH (ref 0.44–1.00)
GFR, Est AFR Am: >60 mL/min (ref >60)
GFR, Estimated: >60 mL/min (ref >60)
Glucose, Bld: 180 mg/dL — ABNORMAL HIGH (ref 70–99)
Potassium: 3.9 mmol/L (ref 3.5–5.1)
Sodium: 140 mmol/L (ref 135–145)
Total Bilirubin: 1 mg/dL (ref 0.3–1.2)
Total Protein: 7.2 g/dL (ref 6.5–8.1)

## 2019-03-24 LAB — SAMPLE TO BLOOD BANK

## 2019-03-24 MED ORDER — GENERIC EXTERNAL MEDICATION
Status: DC
Start: ? — End: 2019-03-24

## 2019-03-24 MED ORDER — Medication
80.00 | Status: DC
Start: 2019-03-23 — End: 2019-03-24

## 2019-03-24 MED ORDER — SODIUM CHLORIDE 0.9% FLUSH
10.0000 mL | INTRAVENOUS | Status: DC | PRN
  Administered 2019-03-24: 10 mL
  Filled 2019-03-24: qty 10

## 2019-03-24 MED ORDER — FAMOTIDINE 20 MG PO TABS
20.00 | ORAL_TABLET | ORAL | Status: DC
Start: 2019-03-22 — End: 2019-03-24

## 2019-03-24 NOTE — Progress Notes (Addendum)
[place] OFFICE PROGRESS NOTE   Diagnosis: CLL, pancytopenia  INTERVAL HISTORY:   Meagan Walsh returns as scheduled.  She was hospitalized at [facility] 03/18/2069 through 03/22/2019 for ATG, cyclosporine, day 1 03/19/2019.  She developed symptoms of a transfusion reaction on day 1.  She received Demerol and symptoms resolved.  Rate was reduced by one half, tolerated.  She had no further issues.  She was discharged home on cyclosporine and a prednisone taper on 03/22/2019.  She feels weak.  No bleeding.  No fever.  She has mild nausea.  No mouth sores.  No diarrhea.  She feels "winded" at times.  Objective:  Vital signs in last 24 hours:  Blood pressure 99/67, pulse 100, temperature 98.3 F (36.8 C), temperature source Oral, resp. rate 18, height '5\' 6"'  (1.676 m), weight 141 lb 1.6 oz (64 kg), SpO2 100 %.    HEENT: Small ecchymosis right buccal mucosa and left tongue.  Area of bleeding right lower anterior gumline. GI: Abdomen soft and nontender.  No hepatomegaly.  No splenomegaly. Vascular: No leg edema. Neuro: Alert and oriented. Left upper extremity PICC without erythema.  Lab Results:  Lab Results  Component Value Date   WBC 1.8 (L) 03/17/2019   HGB 9.0 (L) 03/17/2019   HCT 26.3 (L) 03/17/2019   MCV 84.0 03/17/2019   PLT 18 (L) 03/17/2019   NEUTROABS 0.1 (LL) 03/17/2019    Imaging:  No results found.  Medications: I have reviewed the patient's current medications.  Assessment/Plan: 1. 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 Mild splenomegaly.  Ultrasound-guided biopsy of the left inguinal lymph node 09/18/2017, slightly "purulent "fluid aspirated, core biopsy is consistent  with an atypical lymphoid proliferation-extensive necrosis with surrounding epithelioid histiocytes, limited intact lymphoid tissue involved with CLL  Incisional biopsy of a necrotic/purulent left inguinal lymph node on 10/01/2017-extensive necrosis with granulomatous inflammation, small amount of viable lymphoid tissue involved with CLL, AFB and fungal stains negative  Peripheral blood FISH analysis 02/05/2018-deletion 13q14, no evidence of p53 (17p13) deletion, no evidence of 11q22deletion  Bone marrow biopsy 02/26/2018-hypercellular marrow with extensive involvement by CLL, lymphocytes represent85% of all cells  Ibrutinib initiated 04/03/2018  Ibrutinib placed on hold 04/11/2018 due to onset of arthralgias  Ibrutinib resumed 04/16/2018, discontinued 04/25/2018 secondary to severe arthralgias/arthritis  Ibrutinib resumed at a dose of 140 mg daily 05/03/2018  Ibrutinib dose adjusted to 140 mg alternating with 229m 06/25/2018  Ibrutinib discontinued 07/03/2018 secondary to severe arthralgias  Acalabrutinib 08/16/2018, discontinued 11/15/2018 secondary to persistent severe transfusion dependent anemia and neutropenia/thrombocytopenia, last dose 11/14/2018  Bone marrow biopsy 11/21/2018-decreased cellularity, involvement by CLL, decreased erythroid and granulocytic precursors, decreased megakaryocytes  Cycle 1 rituximab 12/06/2018  Bone marrow biopsy 12/24/2018 at [facility]-hypocellular bone marrow (10%) involved by CLL, representing 50% of marrow cellularity; markedly decreased trilineage hematopoiesis including essentially absent erythropoiesis  Bone marrow biopsy 03/06/2027 [facility]- hypocellular bone marrow (20%) with marked reduction of maturing hematopoietic elements; numerous lymphoid aggregates consistent with involvement by CLL, representing approximately 50% of marrow cellularity by flow cytometry  [facility]  2.Hypothyroidism 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 Augmentin prescribed.  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 Pancytopenia secondary to CLL and a hypocellular bone marrow  G-CSF and Nplate started 1/97/5883, G-CSF changed to daily beginning 12/17/2018; G-CSF discontinued 12/31/2018, last Nplate 01/15/2019  Began prednisone 60 mg daily 01/11/19, tolerating moderately well except jitteriness, irritability, and difficulty sleeping. Tapered to 40 mg daily x1 week starting 01/24/19, reduce by 10 mg each week until discontinued. Prednisone discontinued 02/20/2019.  Promacta started 01/20/2019, dose increased to 100 mg daily 02/04/2019; dose increased to 150 mg daily 02/21/2019  IVIG daily for 2 days beginning 02/25/2019   9. Severe headache and nausea/vomiting 02/27/2019-likely related to IVIG therapy,improved   Disposition: Ms. Kath appears stable.  She was hospitalized at [facility] 03/19/2019 for ATG, cyclosporine.  She was discharged home 03/22/2019.  She had reaction symptoms with the first ATG infusion, tolerated the remainder well.  She continues cyclosporine.  She is completing a prednisone taper.  We reviewed the CBC from today.  She will not require transfusion support today.  The neutrophil count is 0.0.  We again reviewed neutropenic precautions.  The platelet count is 12,000.  We discussed bleeding precautions.  She understands to contact the office with fever, chills, other signs of infection, bleeding.  We will repeat a CBC on 03/26/2019.  Liver transaminases are elevated.  Also elevated at [facility].  Question secondary to ATG.  We will  continue to monitor.  She will return for labs on 03/26/2019 to include CBC, chemistry panel, cyclosporine level.  We will provide transfusion support as needed.  We will see her in follow-up on 03/28/2019.  She will contact the office in the interim as outlined above or with any other problems.  Patient seen with Dr. Benay Spice.  25 minutes were spent face-to-face at today's visit with the majority of that time involved in counseling/coordination of care.    Ned Card ANP/GNP-BC   03/24/2019  12:05 PM  This was a shared visit with Ned Card.  [name] has completed a cycle of ATG and is taking cyclosporine.  She appears stable.  She has persistent severe neutropenia and thrombocytopenia.  We have communicated with the [facility] team regarding the laboratory follow-up scheduled.  We will continue to provide transfusion support as needed.  [name], MD

## 2019-03-24 NOTE — Progress Notes (Signed)
Telephone call from lab for critical [place] 0. Leander Rams NP informed.

## 2019-03-25 ENCOUNTER — Telehealth: Payer: Self-pay | Admitting: Nurse Practitioner

## 2019-03-25 ENCOUNTER — Telehealth: Payer: Self-pay | Admitting: *Deleted

## 2019-03-25 ENCOUNTER — Other Ambulatory Visit: Payer: Self-pay | Admitting: *Deleted

## 2019-03-25 DIAGNOSIS — D696 Thrombocytopenia, unspecified: Secondary | ICD-10-CM

## 2019-03-25 DIAGNOSIS — C9110 Chronic lymphocytic leukemia of B-cell type not having achieved remission: Secondary | ICD-10-CM

## 2019-03-25 MED ORDER — CYCLOSPORINE MODIFIED (NEORAL) 100 MG/ML PO SOLN: 100.0000 mg | Freq: Two times a day (BID) | ORAL | 0 refills | Status: DC

## 2019-03-25 MED FILL — CYCLOSPORINE 100 MG/ML SOLN: 100 | 25 days supply | Qty: 50 | Fill #0

## 2019-03-25 NOTE — Progress Notes (Signed)
Per Dr. Benay Spice: Need to draw sample tomorrow for crossmatched platelets to be given on 03/28/2019. Orders placed and blood bank aware sample will be provided tomorrow.

## 2019-03-25 NOTE — Telephone Encounter (Signed)
Return call from [place], [place] at [facility]. Per Dr. Prince Solian: continue to draw labs locally and continue same dose until results are received. Requested to be kept in the loop for this as well. Fax results to [phone].

## 2019-03-25 NOTE — Telephone Encounter (Signed)
Called to request her cyclosporin 100 mg capsules be changed to oral solution. They are huge and difficult to swallow. She has no problem with the 25 mg capsules. Also asking what to do about her morning dose of cyclosporin in regards to labs tomorrow? Per Dr. Benay Spice: OK to change to oral solution (100 mg/ml per [facility] Pharmacist, Aaron Edelman). Dr. Benay Spice requested call to Alba Destine at [facility] regarding the cyclosporin level and turn-around-time here is 2-3 days (sent to [facility]) and if this is an issue and what to do about her dosing while awaiting results. Joellen Jersey will F/U with team and call back [phone]).

## 2019-03-25 NOTE — Telephone Encounter (Signed)
Called and spoke with patient. Confirmed change in appts

## 2019-03-26 ENCOUNTER — Inpatient Hospital Stay: Payer: BC Managed Care – PPO | Attending: Oncology

## 2019-03-26 ENCOUNTER — Inpatient Hospital Stay: Payer: BC Managed Care – PPO

## 2019-03-26 ENCOUNTER — Other Ambulatory Visit: Payer: Self-pay

## 2019-03-26 ENCOUNTER — Other Ambulatory Visit: Payer: Self-pay | Admitting: *Deleted

## 2019-03-26 ENCOUNTER — Inpatient Hospital Stay (HOSPITAL_BASED_OUTPATIENT_CLINIC_OR_DEPARTMENT_OTHER): Payer: BC Managed Care – PPO | Admitting: Oncology

## 2019-03-26 VITALS — BP 124/68 | HR 75 | Temp 98.6°F | Resp 17

## 2019-03-26 DIAGNOSIS — C9110 Chronic lymphocytic leukemia of B-cell type not having achieved remission: Secondary | ICD-10-CM

## 2019-03-26 DIAGNOSIS — K068 Other specified disorders of gingiva and edentulous alveolar ridge: Secondary | ICD-10-CM | POA: Insufficient documentation

## 2019-03-26 DIAGNOSIS — E039 Hypothyroidism, unspecified: Secondary | ICD-10-CM | POA: Insufficient documentation

## 2019-03-26 DIAGNOSIS — Z79899 Other long term (current) drug therapy: Secondary | ICD-10-CM | POA: Diagnosis not present

## 2019-03-26 DIAGNOSIS — D801 Nonfamilial hypogammaglobulinemia: Secondary | ICD-10-CM | POA: Insufficient documentation

## 2019-03-26 DIAGNOSIS — D61818 Other pancytopenia: Secondary | ICD-10-CM | POA: Diagnosis not present

## 2019-03-26 DIAGNOSIS — Z23 Encounter for immunization: Secondary | ICD-10-CM | POA: Insufficient documentation

## 2019-03-26 DIAGNOSIS — R109 Unspecified abdominal pain: Secondary | ICD-10-CM | POA: Insufficient documentation

## 2019-03-26 DIAGNOSIS — D696 Thrombocytopenia, unspecified: Secondary | ICD-10-CM

## 2019-03-26 DIAGNOSIS — Z95828 Presence of other vascular implants and grafts: Secondary | ICD-10-CM

## 2019-03-26 DIAGNOSIS — R5381 Other malaise: Secondary | ICD-10-CM | POA: Diagnosis not present

## 2019-03-26 DIAGNOSIS — R233 Spontaneous ecchymoses: Secondary | ICD-10-CM | POA: Insufficient documentation

## 2019-03-26 LAB — CMP (CANCER CENTER ONLY)
ALT: 146 U/L — ABNORMAL HIGH (ref 0–44)
AST: 41 U/L (ref 15–41)
Albumin: 3.4 g/dL — ABNORMAL LOW (ref 3.5–5.0)
Alkaline Phosphatase: 85 U/L (ref 38–126)
Anion gap: 8 (ref 5–15)
BUN: 25 mg/dL — ABNORMAL HIGH (ref 6–20)
CO2: 26 mmol/L (ref 22–32)
Calcium: 9.2 mg/dL (ref 8.9–10.3)
Chloride: 106 mmol/L (ref 98–111)
Creatinine: 0.79 mg/dL (ref 0.44–1.00)
GFR, Est AFR Am: >60 mL/min (ref >60)
GFR, Estimated: >60 mL/min (ref >60)
Glucose, Bld: 137 mg/dL — ABNORMAL HIGH (ref 70–99)
Potassium: 3.6 mmol/L (ref 3.5–5.1)
Sodium: 140 mmol/L (ref 135–145)
Total Bilirubin: 1 mg/dL (ref 0.3–1.2)
Total Protein: 6.2 g/dL — ABNORMAL LOW (ref 6.5–8.1)

## 2019-03-26 LAB — CBC WITH DIFFERENTIAL (CANCER CENTER ONLY)
Abs Immature Granulocytes: 0 10*3/uL (ref 0.00–0.07)
Basophils Absolute: 0 10*3/uL (ref 0.0–0.1)
Basophils Relative: 0 %
Eosinophils Absolute: 0 10*3/uL (ref 0.0–0.5)
Eosinophils Relative: 0 %
HCT: 30.9 % — ABNORMAL LOW (ref 36.0–46.0)
Hemoglobin: 10.8 g/dL — ABNORMAL LOW (ref 12.0–15.0)
Immature Granulocytes: 0 %
Lymphocytes Relative: 99 %
Lymphs Abs: 8.6 10*3/uL — ABNORMAL HIGH (ref 0.7–4.0)
MCH: 28.6 pg (ref 26.0–34.0)
MCHC: 35 g/dL (ref 30.0–36.0)
MCV: 82 fL (ref 80.0–100.0)
Monocytes Absolute: 0 10*3/uL — ABNORMAL LOW (ref 0.1–1.0)
Monocytes Relative: 0 %
Neutro Abs: 0 10*3/uL — CL (ref 1.7–7.7)
Neutrophils Relative %: 1 %
Platelet Count: 5 10*3/uL — CL (ref 150–400)
RBC: 3.77 MIL/uL — ABNORMAL LOW (ref 3.87–5.11)
RDW: 12.8 % (ref 11.5–15.5)
WBC Count: 8.6 10*3/uL (ref 4.0–10.5)
nRBC: 0 % (ref 0.0–0.2)

## 2019-03-26 LAB — PLATELET COUNT (CANCER CENTER ONLY)
Platelet Count: 15 10*3/uL — ABNORMAL LOW (ref 150–400)
Platelet Count: 33 10*3/uL — ABNORMAL LOW (ref 150–400)

## 2019-03-26 MED ORDER — HEPARIN SOD (PORK) LOCK FLUSH 100 UNIT/ML IV SOLN
250.0000 [IU] | INTRAVENOUS | Status: AC | PRN
  Administered 2019-03-26: 250 [IU]
  Filled 2019-03-26: qty 5

## 2019-03-26 MED ORDER — SODIUM CHLORIDE 0.9% IV SOLUTION
250.0000 mL | Freq: Once | INTRAVENOUS | Status: AC
  Administered 2019-03-26: 250 mL via INTRAVENOUS
  Filled 2019-03-26: qty 250

## 2019-03-26 MED ORDER — SODIUM CHLORIDE 0.9% FLUSH
10.0000 mL | INTRAVENOUS | Status: AC | PRN
  Administered 2019-03-26: 10 mL
  Filled 2019-03-26: qty 10

## 2019-03-26 MED ORDER — SODIUM CHLORIDE 0.9% FLUSH
10.0000 mL | INTRAVENOUS | Status: DC | PRN
  Administered 2019-03-26: 10 mL
  Filled 2019-03-26: qty 10

## 2019-03-26 NOTE — Progress Notes (Signed)
Called blood bank this am to request they obtain random platelets for today. Will transfuse crossmatched platelets on Friday. Confirmed blood bank has orders.

## 2019-03-26 NOTE — Patient Instructions (Addendum)
Thrombocytopenia Thrombocytopenia means that you have a low number of platelets in your blood. Platelets are tiny cells in the blood. When you bleed, they clump together at the cut or injury to stop the bleeding. This is called blood clotting. If you do not have enough platelets, it can cause bleeding problems. Some cases of this condition are mild while others are more severe. What are the causes? This condition may be caused by:  Your body not making enough platelets. This may be caused by: ? Your bone marrow not making blood cells (aplastic anemia). ? Cancer in the bone marrow. ? Certain medicines. ? Infection in the bone marrow. ? Drinking a lot of alcohol.  Your body destroying platelets too quickly. This may be caused by: ? Certain immune diseases. ? Certain medicines. ? Certain blood clotting disorders. ? Certain disorders that are passed from parent to child (inherited). ? Certain bleeding disorders. ? Pregnancy. ? Having a spleen that is larger than normal. What are the signs or symptoms?  Bleeding that is not normal.  Nosebleeds.  Heavy menstrual periods.  Blood in the pee (urine) or poop (stool).  A purple-like color to the skin (purpura).  Bruising.  A rash that looks like pinpoint, purple-red spots (petechiae). How is this treated?  Treatment of another condition that is causing the low platelet count.  Medicines to help protect your platelets from being destroyed.  A replacement (transfusion) of platelets to stop or prevent bleeding.  Surgery to remove the spleen. Follow these instructions at home: Activity  Avoid activities that could cause you to get hurt or bruised. Follow instructions about how to prevent falls.  Take care not to cut yourself: ? When you shave. ? When you use scissors, needles, knives, or other tools.  Take care not to burn yourself: ? When you use an iron. ? When you cook. General instructions   Check your skin and the  inside of your mouth for bruises or blood as told by your doctor.  Check to see if there is blood in your spit (sputum), pee, and poop. Do this as told by your doctor.  Do not drink alcohol.  Take over-the-counter and prescription medicines only as told by your doctor.  Do not take any medicines that have aspirin or NSAIDs in them. These medicines can thin your blood and cause you to bleed.  Tell all of your doctors that you have this condition. Be sure to tell your dentist and eye doctor too. Contact a doctor if:  You have bruises and you do not know why. Get help right away if:  You are bleeding anywhere on your body.  You have blood in your spit, pee, or poop. Summary  Thrombocytopenia means that you have a low number of platelets in your blood.  Platelets are needed for blood clotting.  Symptoms of this condition include bleeding that is not normal, and bruising.  Take care not to cut or burn yourself. This information is not intended to replace advice given to you by your health care provider. Make sure you discuss any questions you have with your health care provider. Document Released: 06/29/2011 Document Revised: 04/11/2018 Document Reviewed: 04/11/2018 Elsevier Patient Education  2020 [employer].    Blood Transfusion, Adult, Care After This sheet gives you information about how to care for yourself after your procedure. Your doctor may also give you more specific instructions. If you have problems or questions, contact your doctor. Follow these instructions at home:  Take over-the-counter and prescription medicines only as told by your doctor.  Go back to your normal activities as told by your doctor.  Follow instructions from your doctor about how to take care of the area where an IV tube was put into your vein (insertion site). Make sure you: ? Wash your hands with soap and water before you change your bandage (dressing). If there is no soap and water, use  hand sanitizer. ? Change your bandage as told by your doctor.  Check your IV insertion site every day for signs of infection. Check for: ? More redness, swelling, or pain. ? More fluid or blood. ? Warmth. ? Pus or a bad smell. Contact a doctor if:  You have more redness, swelling, or pain around the IV insertion site.  You have more fluid or blood coming from the IV insertion site.  Your IV insertion site feels warm to the touch.  You have pus or a bad smell coming from the IV insertion site.  Your pee (urine) turns pink, red, or brown.  You feel weak after doing your normal activities. Get help right away if:  You have signs of a serious allergic or body defense (immune) system reaction, including: ? Itchiness. ? Hives. ? Trouble breathing. ? Anxiety. ? Pain in your chest or lower back. ? Fever, flushing, and chills. ? Fast pulse. ? Rash. ? Watery poop (diarrhea). ? Throwing up (vomiting). ? Dark pee. ? Serious headache. ? Dizziness. ? Stiff neck. ? Yellow color in your face or the white parts of your eyes (jaundice). Summary  After a blood transfusion, return to your normal activities as told by your doctor.  Every day, check for signs of infection where the IV tube was put into your vein.  Some signs of infection are warm skin, more redness and pain, more fluid or blood, and pus or a bad smell where the needle went in.  Contact your doctor if you feel weak or have any unusual symptoms. This information is not intended to replace advice given to you by your health care provider. Make sure you discuss any questions you have with your health care provider. Document Released: 07/31/2014 Document Revised: 11/14/2017 Document Reviewed: 03/03/2016 Elsevier Patient Education  2020 [employer].

## 2019-03-26 NOTE — Progress Notes (Signed)
2nd unit of platelets completed with 30 minute count up to 33,000. She reports the gum bleeding has stopped for now. Instructed her that if bleeding returns to call office. Do not remove clots from gums, rinse mouth gently with cool water. MD looking into how to obtain amicar oral rinses for her--Dr. Coombs has been consulted on dosing. She will return on Friday for labs/OV/crossmatched platelets. May need to have transfusion on Monday. [place] and [facility] agrees to transfuse her on Thursday if needed. She will continue her cyclosporin with results still pending.

## 2019-03-26 NOTE — Progress Notes (Signed)
[place] OFFICE PROGRESS NOTE   Diagnosis: Aplastic anemia, CLL  INTERVAL HISTORY:   Ms. Yera noted increased gum bleeding beginning last night.  The gum bleeding has persisted today.  No fever.  She has a bruise at the right lower leg, no other bleeding.    Objective:  Vital signs in last 24 hours:  There were no vitals taken for this visit.    HEENT: No thrush, oozing at the left upper and left lower gumline. Skin: Small ecchymosis at the right lower leg, no other ecchymoses at the lower legs  Portacath/PICC-without erythema  Lab Results:  Lab Results  Component Value Date   WBC 8.6 03/26/2019   HGB 10.8 (L) 03/26/2019   HCT 30.9 (L) 03/26/2019   MCV 82.0 03/26/2019   PLT 15 (L) 03/26/2019   NEUTROABS 0.0 (LL) 03/26/2019    CMP  Lab Results  Component Value Date   NA 140 03/26/2019   K 3.6 03/26/2019   CL 106 03/26/2019   CO2 26 03/26/2019   GLUCOSE 137 (H) 03/26/2019   BUN 25 (H) 03/26/2019   CREATININE 0.79 03/26/2019   CALCIUM 9.2 03/26/2019   PROT 6.2 (L) 03/26/2019   ALBUMIN 3.4 (L) 03/26/2019   AST 41 03/26/2019   ALT 146 (H) 03/26/2019   ALKPHOS 85 03/26/2019   BILITOT 1.0 03/26/2019   GFRNONAA >60 03/26/2019   GFRAA >60 03/26/2019    Medications: I have reviewed the patient's current medications.   Assessment/Plan: . 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 Mild splenomegaly.  Ultrasound-guided biopsy of the left inguinal lymph node 09/18/2017, slightly "purulent "fluid aspirated, core biopsy is consistent with an atypical lymphoid proliferation-extensive necrosis with surrounding epithelioid histiocytes, limited intact lymphoid tissue  involved with CLL  Incisional biopsy of a necrotic/purulent left inguinal lymph node on 10/01/2017-extensive necrosis with granulomatous inflammation, small amount of viable lymphoid tissue involved with CLL, AFB and fungal stains negative  Peripheral blood FISH analysis 02/05/2018-deletion 13q14, no evidence of p53 (17p13) deletion, no evidence of 11q22deletion  Bone marrow biopsy 02/26/2018-hypercellular marrow with extensive involvement by CLL, lymphocytes represent85% of all cells  Ibrutinib initiated 04/03/2018  Ibrutinib placed on hold 04/11/2018 due to onset of arthralgias  Ibrutinib resumed 04/16/2018, discontinued 04/25/2018 secondary to severe arthralgias/arthritis  Ibrutinib resumed at a dose of 140 mg daily 05/03/2018  Ibrutinib dose adjusted to 140 mg alternating with 267m 06/25/2018  Ibrutinib discontinued 07/03/2018 secondary to severe arthralgias  Acalabrutinib 08/16/2018, discontinued 11/15/2018 secondary to persistent severe transfusion dependent anemia and neutropenia/thrombocytopenia, last dose 11/14/2018  Bone marrow biopsy 11/21/2018-decreased cellularity, involvement by CLL, decreased erythroid and granulocytic precursors, decreased megakaryocytes  Cycle 1 rituximab 12/06/2018  Bone marrow biopsy 12/24/2018 at [facility]-hypocellular bone marrow (10%) involved by CLL, representing 50% of marrow cellularity; markedly decreased trilineage hematopoiesis including essentially absent erythropoiesis  Bone marrow biopsy 03/06/2027 [facility]- hypocellular bone marrow (20%) with marked reduction of maturing hematopoietic elements; numerous lymphoid aggregates consistent with involvement by CLL, representing approximately 50% of marrow cellularity by flow cytometry  [facility]  2.Hypothyroidism 3.Hepatitis B surface and core antibody positive  Hepatitis B surface antigen negative and hepatitis B core antibody -12/02/2018 4.Left lung pneumonia diagnosed  10/08/2017-completed 7 days of Levaquin 5.Left lung pneumoniaon chest x-ray 12/27/2017. Augmentin prescribed. 6.Anemia secondary to  CLL-DAT negative, bilirubin and LDH normal June 2019, progressive symptomatic anemia 04/01/2018, red cell transfusions 04/01/2018,followed by multiple additional red cell transfusions 7.Hypogammaglobulinemia 8. Pancytopenia secondary to CLL and a hypocellular bone marrow  G-CSF and Nplate started 10/17/7122, G-CSF changed to daily beginning 12/17/2018; G-CSF discontinued 12/31/2018, last Nplate 01/15/2019  Began prednisone 60 mg daily 01/11/19, tolerating moderately well except jitteriness, irritability, and difficulty sleeping. Tapered to 40 mg daily x1 week starting 01/24/19, reduce by 10 mg each week until discontinued. Prednisone discontinued 02/20/2019.  Promacta started 01/20/2019, dose increased to 100 mg daily 02/04/2019; dose increased to 150 mg daily 02/21/2019  IVIG daily for 2 days beginning 02/25/2019   9. Severe headache and nausea/vomiting 02/27/2019-likely related to IVIG therapy,improved     Disposition: Ms. Hobday has persistent severe neutropenia and thrombocytopenia.  She has increased bleeding at the gums today.  She will receive platelet transfusion support.  I will contact [facility] regarding the indication for Amicar rinse.  She has a vacation to the beach planned for next week.  She understands the risk of infection with the severe cytopenias.  She agrees to return here 03/31/2019 for transfusions as needed and would like to have a transfusion at [facility] in the middle of the week.  I will contact Dr. Prince Solian to see if she is in agreement.  Ms. Stapp will return for an office visit as scheduled on 04/03/2019.  We obtained a cyclosporine level today.  She continues prophylactic antibiotic support.  Betsy Coder, MD  03/26/2019  2:36 PM

## 2019-03-26 NOTE — Progress Notes (Signed)
Platelet count up to 15,000 after infusion. Gums still oozing blood. Per Dr. Benay Spice: transfuse a 2nd unit of platelets now. Blood bank and infusion nurse notified. Also discussed with patient. Will do another post count today and if not significant bump will need labs tomorrow.

## 2019-03-27 LAB — PREPARE PLATELET PHERESIS
Unit division: 0
Unit division: 0

## 2019-03-27 LAB — BPAM PLATELET PHERESIS
Blood Product Expiration Date: 202009042359
Blood Product Expiration Date: 202009042359
ISSUE DATE / TIME: 202009021110
ISSUE DATE / TIME: 202009021317
Unit Type and Rh: 5100
Unit Type and Rh: 5100

## 2019-03-27 NOTE — Unmapped (Signed)
03/26/19 confirmed 9/10 appointments with [place] Citizen @ 5:48pm.

## 2019-03-28 ENCOUNTER — Telehealth: Payer: Self-pay | Admitting: *Deleted

## 2019-03-28 ENCOUNTER — Other Ambulatory Visit: Payer: Self-pay | Admitting: Nurse Practitioner

## 2019-03-28 ENCOUNTER — Other Ambulatory Visit: Payer: Self-pay | Admitting: *Deleted

## 2019-03-28 ENCOUNTER — Inpatient Hospital Stay: Payer: BC Managed Care – PPO

## 2019-03-28 ENCOUNTER — Telehealth: Payer: Self-pay | Admitting: Oncology

## 2019-03-28 ENCOUNTER — Inpatient Hospital Stay (HOSPITAL_BASED_OUTPATIENT_CLINIC_OR_DEPARTMENT_OTHER): Payer: BC Managed Care – PPO | Admitting: Oncology

## 2019-03-28 ENCOUNTER — Other Ambulatory Visit: Payer: Self-pay

## 2019-03-28 VITALS — BP 122/71 | HR 87 | Temp 98.3°F | Resp 18 | Ht 66.0 in | Wt 141.1 lb

## 2019-03-28 DIAGNOSIS — Z23 Encounter for immunization: Secondary | ICD-10-CM | POA: Diagnosis not present

## 2019-03-28 DIAGNOSIS — K068 Other specified disorders of gingiva and edentulous alveolar ridge: Secondary | ICD-10-CM | POA: Diagnosis not present

## 2019-03-28 DIAGNOSIS — D696 Thrombocytopenia, unspecified: Secondary | ICD-10-CM

## 2019-03-28 DIAGNOSIS — R233 Spontaneous ecchymoses: Secondary | ICD-10-CM | POA: Diagnosis not present

## 2019-03-28 DIAGNOSIS — C9110 Chronic lymphocytic leukemia of B-cell type not having achieved remission: Secondary | ICD-10-CM

## 2019-03-28 DIAGNOSIS — D801 Nonfamilial hypogammaglobulinemia: Secondary | ICD-10-CM | POA: Diagnosis not present

## 2019-03-28 DIAGNOSIS — R5381 Other malaise: Secondary | ICD-10-CM | POA: Diagnosis not present

## 2019-03-28 DIAGNOSIS — Z95828 Presence of other vascular implants and grafts: Secondary | ICD-10-CM

## 2019-03-28 DIAGNOSIS — E039 Hypothyroidism, unspecified: Secondary | ICD-10-CM | POA: Diagnosis not present

## 2019-03-28 DIAGNOSIS — D649 Anemia, unspecified: Secondary | ICD-10-CM

## 2019-03-28 DIAGNOSIS — R109 Unspecified abdominal pain: Secondary | ICD-10-CM | POA: Diagnosis not present

## 2019-03-28 DIAGNOSIS — Z79899 Other long term (current) drug therapy: Secondary | ICD-10-CM | POA: Diagnosis not present

## 2019-03-28 DIAGNOSIS — D61818 Other pancytopenia: Secondary | ICD-10-CM | POA: Diagnosis not present

## 2019-03-28 LAB — CBC WITH DIFFERENTIAL (CANCER CENTER ONLY)
Abs Immature Granulocytes: 0 10*3/uL (ref 0.00–0.07)
Basophils Absolute: 0 10*3/uL (ref 0.0–0.1)
Basophils Relative: 0 %
Eosinophils Absolute: 0 10*3/uL (ref 0.0–0.5)
Eosinophils Relative: 0 %
HCT: 27.5 % — ABNORMAL LOW (ref 36.0–46.0)
Hemoglobin: 9.6 g/dL — ABNORMAL LOW (ref 12.0–15.0)
Immature Granulocytes: 0 %
Lymphocytes Relative: 99 %
Lymphs Abs: 9.4 10*3/uL — ABNORMAL HIGH (ref 0.7–4.0)
MCH: 28.7 pg (ref 26.0–34.0)
MCHC: 34.9 g/dL (ref 30.0–36.0)
MCV: 82.3 fL (ref 80.0–100.0)
Monocytes Absolute: 0 10*3/uL — ABNORMAL LOW (ref 0.1–1.0)
Monocytes Relative: 0 %
Neutro Abs: 0.1 10*3/uL — CL (ref 1.7–7.7)
Neutrophils Relative %: 1 %
Platelet Count: 24 10*3/uL — ABNORMAL LOW (ref 150–400)
RBC: 3.34 MIL/uL — ABNORMAL LOW (ref 3.87–5.11)
RDW: 12.8 % (ref 11.5–15.5)
WBC Count: 9.6 10*3/uL (ref 4.0–10.5)
nRBC: 0 % (ref 0.0–0.2)

## 2019-03-28 LAB — PREPARE RBC (CROSSMATCH)

## 2019-03-28 LAB — CMP (CANCER CENTER ONLY)
ALT: 124 U/L — ABNORMAL HIGH (ref 0–44)
AST: 33 U/L (ref 15–41)
Albumin: 3.3 g/dL — ABNORMAL LOW (ref 3.5–5.0)
Alkaline Phosphatase: 77 U/L (ref 38–126)
Anion gap: 8 (ref 5–15)
BUN: 20 mg/dL (ref 6–20)
CO2: 27 mmol/L (ref 22–32)
Calcium: 9.1 mg/dL (ref 8.9–10.3)
Chloride: 105 mmol/L (ref 98–111)
Creatinine: 0.72 mg/dL (ref 0.44–1.00)
GFR, Est AFR Am: >60 mL/min (ref >60)
GFR, Estimated: >60 mL/min (ref >60)
Glucose, Bld: 134 mg/dL — ABNORMAL HIGH (ref 70–99)
Potassium: 3.5 mmol/L (ref 3.5–5.1)
Sodium: 140 mmol/L (ref 135–145)
Total Bilirubin: 0.8 mg/dL (ref 0.3–1.2)
Total Protein: 5.9 g/dL — ABNORMAL LOW (ref 6.5–8.1)

## 2019-03-28 LAB — SAMPLE TO BLOOD BANK

## 2019-03-28 LAB — CYCLOSPORINE: Cyclosporine, LabCorp: 1038 ng/mL — ABNORMAL HIGH (ref 100–400)

## 2019-03-28 MED ORDER — HEPARIN SOD (PORK) LOCK FLUSH 100 UNIT/ML IV SOLN
250.0000 [IU] | INTRAVENOUS | Status: AC | PRN
  Administered 2019-03-28: 250 [IU]
  Filled 2019-03-28: qty 5

## 2019-03-28 MED ORDER — SODIUM CHLORIDE 0.9% FLUSH
10.0000 mL | INTRAVENOUS | Status: DC | PRN
  Administered 2019-03-28: 10 mL
  Filled 2019-03-28: qty 10

## 2019-03-28 MED ORDER — SODIUM CHLORIDE 0.9% IV SOLUTION
250.0000 mL | Freq: Once | INTRAVENOUS | Status: AC
  Administered 2019-03-28: 250 mL via INTRAVENOUS
  Filled 2019-03-28: qty 250

## 2019-03-28 MED ORDER — HEPARIN SOD (PORK) LOCK FLUSH 100 UNIT/ML IV SOLN
500.0000 [IU] | Freq: Once | INTRAVENOUS | Status: DC | PRN
  Filled 2019-03-28: qty 5

## 2019-03-28 MED ORDER — SODIUM CHLORIDE 0.9% FLUSH
10.0000 mL | INTRAVENOUS | Status: AC | PRN
  Administered 2019-03-28: 10 mL
  Filled 2019-03-28: qty 10

## 2019-03-28 NOTE — Telephone Encounter (Signed)
Scheduled appt per 9/4 LOS _ spoke with RN Manuela Schwartz on Secure chat. appt made and Manuela Schwartz to let infusion RN know to print out new appts for pt before she leaves the clinic today

## 2019-03-28 NOTE — Progress Notes (Signed)
Placed order for 1 unit blood on 03/31/2019 and 1 unit crossmatched platelets. Confirmed with blood bank they have samples and see orders.

## 2019-03-28 NOTE — Telephone Encounter (Signed)
CRITICAL VALUE STICKER  CRITICAL VALUE: ANC = 0.1  RECEIVER (on-site recipient of call): Cherylynn Ridges RN, Triage [place] NOTIFIED: 03/28/2019 9:36 am   MESSENGER (representative from lab): Marie MT Myles Gip  MD NOTIFIED: Dr. Benay Spice  TIME OF NOTIFICATION: 03/28/2019; 9:39 am.  RESPONSE: None

## 2019-03-28 NOTE — Progress Notes (Signed)
Reports no further bleeding from gums. Notified patient that her cyclosporin suspension is ready for pick up at [facility] today.

## 2019-03-28 NOTE — Telephone Encounter (Signed)
Discussed cyclosporin results with patient and she reports that she forgot to hold her morning dose and took it at 0730, approximately 2 hours before the blood was drawn. Discussed this with Meagan Walsh, PharmD at [facility] states this is not a true trough and requires a re-draw. Scheduled for Monday when she is here for transfusion. It will be drawn and transported to [facility] lab to send to [place] informed NOT to take her morning dose before the blood is drawn. She understands and agrees. Pharmacist not able to provide specific information about Amicar oral rinses. She forwarded the request to pharmacist on duty to research. She will continue her oral Amicar until this information is received.

## 2019-03-28 NOTE — Progress Notes (Signed)
[facility] OFFICE PROGRESS NOTE   Diagnosis: CLL, aplastic anemia  INTERVAL HISTORY:   [name] returns as scheduled.  She was transfused with 2 units of platelets on 03/26/2019 when she presented with gum bleeding.  The bleeding resolved.  No other bleeding.  No fever.  She has mild nausea that she relates to taking multiple pills.  No difficulty with bowel or bladder function.  No other complaint.  She does not feel in need of a red cell transfusion today.  Objective:  Vital signs in last 24 hours:  Blood pressure 122/71, pulse 87, temperature 98.3 F (36.8 C), temperature source Oral, resp. rate 18, height '5\' 6"'  (1.676 m), weight 141 lb 1.6 oz (64 kg), SpO2 100 %.    HEENT: Few petechiae, no gum bleeding Lymphatics: No cervical, supraclavicular, or axillary nodes Resp: Lungs clear bilaterally Cardio: Regular rate and rhythm GI: No hepatosplenomegaly Vascular: No leg edema  Skin: Ecchymosis at the right lower leg, few petechiae over the abdomen and legs  Portacath/PICC-without erythema  Lab Results:  Lab Results  Component Value Date   WBC 9.6 03/28/2019   HGB 9.6 (L) 03/28/2019   HCT 27.5 (L) 03/28/2019   MCV 82.3 03/28/2019   PLT 24 (L) 03/28/2019   NEUTROABS PENDING 03/28/2019    CMP  Lab Results  Component Value Date   NA 140 03/26/2019   K 3.6 03/26/2019   CL 106 03/26/2019   CO2 26 03/26/2019   GLUCOSE 137 (H) 03/26/2019   BUN 25 (H) 03/26/2019   CREATININE 0.79 03/26/2019   CALCIUM 9.2 03/26/2019   PROT 6.2 (L) 03/26/2019   ALBUMIN 3.4 (L) 03/26/2019   AST 41 03/26/2019   ALT 146 (H) 03/26/2019   ALKPHOS 85 03/26/2019   BILITOT 1.0 03/26/2019   GFRNONAA >60 03/26/2019   GFRAA >60 03/26/2019     Medications: I have reviewed the patient's current medications.   Assessment/Plan: 1. CLL-diagnosed in August 2010, flow cytometry consistent with CLL Enlarged leftinguinal lymph node January 2019,smallneck/axillary nodes and  palpable splenomegaly 09/11/2017  [facility]son 09/17/2017-3 cm necrotic appearing lymph node in the left inguinal region, borderline enlarged pelvic/retroperitoneal, chest, and axillary nodes. Mild splenomegaly.  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, no evidence of 11q22deletion  Bone marrow biopsy 02/26/2018-hypercellular marrow with extensive involvement by CLL, lymphocytes represent85% of all cells  Ibrutinib initiated 04/03/2018  Ibrutinib placed on hold 04/11/2018 due to onset of arthralgias  Ibrutinib resumed 04/16/2018, discontinued 04/25/2018 secondary to severe arthralgias/arthritis  Ibrutinib resumed at a dose of 140 mg daily 05/03/2018  Ibrutinib dose adjusted to 140 mg alternating with 210m 06/25/2018  Ibrutinib discontinued 07/03/2018 secondary to severe arthralgias  Acalabrutinib 08/16/2018, discontinued 11/15/2018 secondary to persistent severe transfusion dependent anemia and neutropenia/thrombocytopenia, last dose 11/14/2018  Bone marrow biopsy 11/21/2018-decreased cellularity, involvement by CLL, decreased erythroid and granulocytic precursors, decreased megakaryocytes  Cycle 1 rituximab 12/06/2018  Bone marrow biopsy 12/24/2018 at [facility]-hypocellular bone marrow (10%) involved by CLL, representing 50% of marrow cellularity; markedly decreased trilineage hematopoiesis including essentially absent erythropoiesis  Bone marrow biopsy  03/06/2027  [facility]- hypocellular bone marrow (20%) with marked reduction of maturing hematopoietic elements; numerous lymphoid aggregates consistent with involvement by CLL, representing approximately 50% of marrow cellularity by flow cytometry  ATG/cyclosporine at [facility] beginning 03/19/2019  2.Hypothyroidism 3.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 Augmentin prescribed. 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 Pancytopenia secondary to CLL and a hypocellular bone marrow  G-CSF and Nplate started 8/34/1962, G-CSF changed to daily beginning 12/17/2018; G-CSF discontinued 12/31/2018, last Nplate 01/15/2019  Began prednisone 60 mg daily 01/11/19, tolerating moderately well except jitteriness, irritability, and difficulty sleeping. Tapered to 40 mg daily x1 week starting 01/24/19, reduce by 10 mg each week until discontinued. Prednisone discontinued 02/20/2019.  Promacta started 01/20/2019, dose increased to 100 mg daily 02/04/2019; dose increased to 150 mg daily 02/21/2019  IVIG daily for 2 days beginning 02/25/2019   9. Severe headache and nausea/vomiting 02/27/2019-likely related to IVIG therapy,improved    Disposition: Meagan Walsh is being treated for aplastic anemia.  She has persistent severe neutropenia and thrombocytopenia.  The neutrophil count is pending today.  She is now at day 10 following the start of ATG therapy.  She continues cyclosporine and is completing a steroid taper.  She will receive platelets today.  She will be scheduled for a red cell transfusion and platelets as needed on 03/31/2019.  [name] plans a vacation to the beach beginning 03/31/2019 and returning on 04/05/2019.  I have communicated with Dr. Zara Council.  She is scheduled for transfusion support at [facility] on 04/03/2019.  We will ask [facility] to check the cyclosporine level that day.  Ms. Winebarger knows to call for bleeding or a fever.  We will schedule a transfusion appointment here for 04/05/2019.  She will return for an office visit on 04/07/2019.  We will follow-up on the CBC 03/31/2019 and decide on the need for a CBC while at the beach on 04/02/2019.  Betsy Coder, MD  03/28/2019  9:26 AM

## 2019-03-30 LAB — BPAM PLATELET PHERESIS
Blood Product Expiration Date: 202009052359
ISSUE DATE / TIME: 202009040956
Unit Type and Rh: 5100

## 2019-03-30 LAB — PREPARE PLATELET PHERESIS: Unit division: 0

## 2019-03-31 ENCOUNTER — Inpatient Hospital Stay: Payer: BC Managed Care – PPO

## 2019-03-31 ENCOUNTER — Other Ambulatory Visit: Payer: Self-pay

## 2019-03-31 VITALS — BP 118/79 | HR 72 | Temp 98.7°F | Resp 16

## 2019-03-31 DIAGNOSIS — D61818 Other pancytopenia: Secondary | ICD-10-CM | POA: Diagnosis not present

## 2019-03-31 DIAGNOSIS — Z23 Encounter for immunization: Secondary | ICD-10-CM | POA: Diagnosis not present

## 2019-03-31 DIAGNOSIS — C9110 Chronic lymphocytic leukemia of B-cell type not having achieved remission: Secondary | ICD-10-CM | POA: Diagnosis not present

## 2019-03-31 DIAGNOSIS — R5381 Other malaise: Secondary | ICD-10-CM | POA: Diagnosis not present

## 2019-03-31 DIAGNOSIS — R109 Unspecified abdominal pain: Secondary | ICD-10-CM | POA: Diagnosis not present

## 2019-03-31 DIAGNOSIS — K068 Other specified disorders of gingiva and edentulous alveolar ridge: Secondary | ICD-10-CM | POA: Diagnosis not present

## 2019-03-31 DIAGNOSIS — R233 Spontaneous ecchymoses: Secondary | ICD-10-CM | POA: Diagnosis not present

## 2019-03-31 DIAGNOSIS — Z79899 Other long term (current) drug therapy: Secondary | ICD-10-CM | POA: Diagnosis not present

## 2019-03-31 DIAGNOSIS — D649 Anemia, unspecified: Secondary | ICD-10-CM

## 2019-03-31 DIAGNOSIS — D696 Thrombocytopenia, unspecified: Secondary | ICD-10-CM

## 2019-03-31 DIAGNOSIS — E039 Hypothyroidism, unspecified: Secondary | ICD-10-CM | POA: Diagnosis not present

## 2019-03-31 DIAGNOSIS — D801 Nonfamilial hypogammaglobulinemia: Secondary | ICD-10-CM | POA: Diagnosis not present

## 2019-03-31 LAB — CBC WITH DIFFERENTIAL/PLATELET
Abs Immature Granulocytes: 0 10*3/uL (ref 0.00–0.07)
Basophils Absolute: 0 10*3/uL (ref 0.0–0.1)
Basophils Relative: 0 %
Eosinophils Absolute: 0 10*3/uL (ref 0.0–0.5)
Eosinophils Relative: 0 %
HCT: 26.5 % — ABNORMAL LOW (ref 36.0–46.0)
Hemoglobin: 9.1 g/dL — ABNORMAL LOW (ref 12.0–15.0)
Immature Granulocytes: 0 %
Lymphocytes Relative: 99 %
Lymphs Abs: 9.8 10*3/uL — ABNORMAL HIGH (ref 0.7–4.0)
MCH: 28.8 pg (ref 26.0–34.0)
MCHC: 34.3 g/dL (ref 30.0–36.0)
MCV: 83.9 fL (ref 80.0–100.0)
Monocytes Absolute: 0 10*3/uL — ABNORMAL LOW (ref 0.1–1.0)
Monocytes Relative: 0 %
Neutro Abs: 0.1 10*3/uL — ABNORMAL LOW (ref 1.7–7.7)
Neutrophils Relative %: 1 %
Platelets: 20 10*3/uL — CL (ref 150–400)
RBC: 3.16 MIL/uL — ABNORMAL LOW (ref 3.87–5.11)
RDW: 13 % (ref 11.5–15.5)
WBC: 9.9 10*3/uL (ref 4.0–10.5)
nRBC: 0 % (ref 0.0–0.2)

## 2019-03-31 MED ORDER — HEPARIN SOD (PORK) LOCK FLUSH 100 UNIT/ML IV SOLN
250.0000 [IU] | INTRAVENOUS | Status: AC | PRN
  Administered 2019-03-31: 250 [IU]
  Filled 2019-03-31: qty 5

## 2019-03-31 MED ORDER — SODIUM CHLORIDE 0.9% FLUSH
10.0000 mL | INTRAVENOUS | Status: AC | PRN
  Administered 2019-03-31: 10 mL
  Filled 2019-03-31: qty 10

## 2019-03-31 MED ORDER — SODIUM CHLORIDE 0.9% IV SOLUTION
250.0000 mL | Freq: Once | INTRAVENOUS | Status: AC
  Administered 2019-03-31: 250 mL via INTRAVENOUS
  Filled 2019-03-31: qty 250

## 2019-03-31 NOTE — Progress Notes (Signed)
Dr. Benay Spice notified of platelet count and Hgb. Advised to proceed with platelet/PRBC transfusion today. Patient aware and agrees to plan of care.

## 2019-04-01 ENCOUNTER — Encounter
Admit: 2019-04-01 | Discharge: 2019-04-02 | Payer: PRIVATE HEALTH INSURANCE | Attending: Pharmacist | Primary: Pharmacist

## 2019-04-01 ENCOUNTER — Telehealth: Payer: Self-pay | Admitting: *Deleted

## 2019-04-01 DIAGNOSIS — D619 Aplastic anemia, unspecified: Secondary | ICD-10-CM

## 2019-04-01 LAB — BPAM PLATELET PHERESIS
Blood Product Expiration Date: 202009082359
ISSUE DATE / TIME: 202009070957
Unit Type and Rh: 9500

## 2019-04-01 LAB — TYPE AND SCREEN
ABO/RH(D): O POS
Antibody Screen: NEGATIVE
Unit division: 0

## 2019-04-01 LAB — PREPARE PLATELET PHERESIS: Unit division: 0

## 2019-04-01 LAB — BPAM RBC
Blood Product Expiration Date: 202010022359
ISSUE DATE / TIME: 202009070946
Unit Type and Rh: 5100

## 2019-04-01 NOTE — Unmapped (Signed)
Pharmacy Telephone Visit:    This patient visit was completed through the use of an audio/video or telephone encounter.      This patient encounter is appropriate and reasonable under the circumstances given the patient's particular presentation at this time. The patient has been advised of the potential risks and limitations of this mode of treatment (including, but not limited to, the absence of in-person examination) and has agreed to be treated in a remote fashion in spite of them. Any and all of the patient's/patient's family's questions on this issue have been answered.      The patient has also been advised to contact this office for worsening conditions or problems, and seek emergency medical treatment and/or call 911 if the patient deems either necessary.  _________________________________     Susan Kane is a 58 y.o. female with aplastic anemia and CLL who I am seeing in clinic today for post-hospital discharge transitions of care visit    Encounter Date: 04/01/2019    Current Treatment: s/p hATG and cyclosporine (D1 = 8/26); continues on cyclosporine 150 mg daily and prednisone taper    For oral chemotherapy:  Pharmacy: can fill locally (first fill at [facility])   Medication Access: $10 with insurance    Interval History: Ms. [name] feels well post discharge. Her main complaints surround prednisone side effects, and is having feelings of being caffeinated (without caffeine), distracted, insomnia, weight gain, bloating, and hunger on pred. She also notes some GERD symptoms and has yet to get some OTC tums to help with this. Has some nausea, but doesn't need any meds for this. Otherwise she doesn't have any other specific complaints or concerns. We spent some time discussing cyclosporine monitoring and need to hold level prior to AM lab draws to get trough level. Last week trough resulted locally at > 1200, which was not a trough (she took dose prior). She is taking 100 mg BID using a suspension in addition to 2 x 25 mg capsules BID (total 150 mg BID). No dose changes since discharge. Scheduling problematic - all future appointments with [facility] cancelled after today, of note drives 2.5 hours to get to the hospital. Labs [place]. F/u here TBD.      Oncologic History:  Oncology History Overview Note   CLL:    Summary as per Dr. Kalman Drape most recent note with minor edits/additions from my review of records    1.??CLL?????diagnosed in August 2010, flow cytometry consistent with CLL  ?? Enlarged left??inguinal lymph node January 2019,??small??neck/axillary nodes and palpable splenomegaly 09/11/2017  ?? CTs??on 09/17/2017-3 cm necrotic appearing lymph node in the left inguinal region, borderline enlarged pelvic/retroperitoneal, chest, and axillary nodes. ??Mild splenomegaly.  ?? Ultrasound-guided biopsy of the left inguinal lymph node 09/18/2017, slightly purulent fluid aspirated, core biopsy is consistent with an atypical lymphoid proliferation???extensive necrosis with surrounding epithelioid histiocytes, limited intact lymphoid tissue involved with CLL  ?? Incisional biopsy of a necrotic/purulent left inguinal lymph node on 10/01/2017???extensive necrosis with granulomatous inflammation, small amount of viable lymphoid tissue involved with CLL, AFB and fungal stains negative  ?? Peripheral blood FISH analysis 02/05/2018??? +deletion 13q14, no evidence of p53 (17p13) deletion, no evidence of 11q22??deletion  - normal karyotype (46,XX)  ?? Bone marrow biopsy 02/26/2018???hypercellular marrow with extensive involvement by CLL, lymphocytes represent??85% of all cells  ?? Ibrutinib initiated 04/03/2018  ?? Ibrutinib placed on hold 04/11/2018 due to onset of arthralgias  ?? Ibrutinib resumed 04/16/2018, discontinued 04/25/2018 secondary to severe arthralgias/arthritis  ??  Ibrutinib resumed at a dose of 140 mg daily 05/03/2018  ?? Ibrutinib dose adjusted to 140 mg alternating with 280 mg 06/25/2018  ?? 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 ??Augmentin prescribed.  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    Baseline BM bx reviewed at [facility]  Final Diagnosis   (Outside Case #:  [id number], dated 02/26/2018)  Bone marrow, aspiration and biopsy  -  Hypercellular bone marrow (80%) with extensive involvement by chronic lymphocytic leukemia (87% lymphocytes by manual aspirate differential)  (See Comment)  -  By outside report, cytogenetic results are normal     Karyotype: 48, XX and FISH with 13q del but no 11q or 17p del    Repeat BM bx: (done after being off ibrutinib x1 month)  80% involvement by CLL    08/13/18: Presents to [facility] to discuss plan of care     Chronic lymphocytic leukemia (CLL), B-cell (CMS-HCC)   07/12/2018 Initial Diagnosis    Chronic lymphocytic leukemia (CLL), B-cell (CMS-HCC)     08/26/2018 -  Chemotherapy    Acalabrutinib started (approximate date)     11/14/2018 -  Chemotherapy    Acalabrutinib discontinued due to progressive bone marrow failure     12/06/2018 -  Chemotherapy    Ritux x1 given due to CLL still making up majority of BM cellularity (though in the setting of near aplasia of rest of TLH)         Weight and Vitals:  Wt Readings from Last 3 Encounters:   03/22/19 71.2 kg (156 lb 15.5 oz)   03/19/19 63.3 kg (139 lb 8.8 oz)   03/11/19 64.4 kg (141 lb 14.4 oz)     Temp Readings from Last 3 Encounters:   03/22/19 36.6 ??C (97.9 ??F) (Oral)   03/11/19 36.7 ??C (98.1 ??F) (Oral)   03/06/19 36.6 ??C (97.9 ??F) (Oral)     BP Readings from Last 3 Encounters:   03/22/19 146/73   03/11/19 117/72   03/06/19 118/82     Pulse Readings from Last 3 Encounters:   03/22/19 (!) 47   03/11/19 84   03/06/19 80       Pertinent Labs:  No visits with results within 1 Day(s) from this visit.   Latest known visit with results is:   Admission on 03/19/2019, Discharged on 03/22/2019   Component Date Value Ref Range Status   ??? SARS-CoV-2 PCR 03/19/2019 Not Detected  Not Detected Final   ??? ABO Grouping 03/19/2019 O POS   Final   ??? Antibody Screen 03/19/2019 NEG   Final   ??? Glucose, POC 03/19/2019 102  70 - 179 mg/dL Final   ??? Albumin 57/84/6962 3.5  3.5 - 5.0 g/dL Final   ??? Total Protein 03/20/2019 5.8* 6.5 - 8.3 g/dL Final   ??? Total Bilirubin 03/20/2019 0.4  0.0 - 1.2 mg/dL Final   ??? Bilirubin, Direct 03/20/2019 <0.10  0.00 - 0.40 mg/dL Final   ??? AST 95/28/4132 37  14 - 38 U/L Final   ??? ALT 03/20/2019 22  <35 U/L Final   ??? Alkaline Phosphatase 03/20/2019 69  38 - 126 U/L Final   ??? Magnesium 03/20/2019 1.8  1.6 - 2.2 mg/dL Final   ???  Phosphorus 03/20/2019 4.1  2.9 - 4.7 mg/dL Final   ??? Sodium 29/56/2130 137  135 - 145 mmol/L Final   ??? Potassium 03/20/2019 3.8  3.5 - 5.0 mmol/L Final   ??? Chloride 03/20/2019 106  98 - 107 mmol/L Final   ??? CO2 03/20/2019 24.0  22.0 - 30.0 mmol/L Final   ??? Anion Gap 03/20/2019 7  7 - 15 mmol/L Final   ??? BUN 03/20/2019 24* 7 - 21 mg/dL Final   ??? Creatinine 03/20/2019 0.62  0.60 - 1.00 mg/dL Final   ??? BUN/Creatinine Ratio 03/20/2019 39   Final   ??? EGFR CKD-EPI Non-African American,* 03/20/2019 >90  >=60 mL/min/1.53m2 Final   ??? EGFR CKD-EPI African American, Fem* 03/20/2019 >90  >=60 mL/min/1.28m2 Final   ??? Glucose 03/20/2019 162  70 - 179 mg/dL Final   ??? Calcium 86/57/8469 9.4  8.5 - 10.2 mg/dL Final   ??? WBC 62/95/2841 0.5* 4.5 - 11.0 10*9/L Final   ??? RBC 03/20/2019 2.38* 4.00 - 5.20 10*12/L Final   ??? HGB 03/20/2019 6.9* 12.0 - 16.0 g/dL Final   ??? HCT 32/44/0102 20.3* 36.0 - 46.0 % Final   ??? MCV 03/20/2019 85.6  80.0 - 100.0 fL Final   ??? MCH 03/20/2019 29.2  26.0 - 34.0 pg Final   ??? MCHC 03/20/2019 34.1  31.0 - 37.0 g/dL Final   ??? RDW 72/53/6644 14.0  12.0 - 15.0 % Final   ??? MPV 03/20/2019 8.4  7.0 - 10.0 fL Final   ??? Platelet 03/20/2019 19* 150 - 440 10*9/L Final   ??? Neutrophils % 03/20/2019 5.9  % Final   ??? Lymphocytes % 03/20/2019 86.0  % Final   ??? Monocytes % 03/20/2019 2.0  % Final   ??? Eosinophils % 03/20/2019 0.5  % Final   ??? Basophils % 03/20/2019 0.4  % Final   ??? Absolute Neutrophils 03/20/2019 0.0* 2.0 - 7.5 10*9/L Final   ??? Absolute Lymphocytes 03/20/2019 0.4* 1.5 - 5.0 10*9/L Final   ??? Absolute Monocytes 03/20/2019 0.0* 0.2 - 0.8 10*9/L Final   ??? Absolute Eosinophils 03/20/2019 0.0  0.0 - 0.4 10*9/L Final   ??? Absolute Basophils 03/20/2019 0.0  0.0 - 0.1 10*9/L Final   ??? Large Unstained Cells 03/20/2019 5* 0 - 4 % Final   ??? Crossmatch 03/21/2019 Compatible   Final   ??? Unit Blood Type 03/21/2019 O Pos   Final   ??? ISBT Number 03/21/2019 5100   Final   ??? Unit # 03/21/2019 [id number]   Final   ??? Status 03/21/2019 Transfused   Final   ??? Product ID 03/21/2019 Red Blood Cells   Final   ??? PRODUCT CODE 03/21/2019 [id number]   Final   ??? Cyclosporine, Timed 03/21/2019 74  ng/mL Final   ??? Albumin 03/21/2019 3.3* 3.5 - 5.0 g/dL Final   ??? Total Protein 03/21/2019 5.8* 6.5 - 8.3 g/dL Final   ??? Total Bilirubin 03/21/2019 0.5  0.0 - 1.2 mg/dL Final   ??? Bilirubin, Direct 03/21/2019 <0.10  0.00 - 0.40 mg/dL Final   ??? AST 18/84/1660 124* 14 - 38 U/L Final   ??? ALT 03/21/2019 83* <35 U/L Final   ??? Alkaline Phosphatase 03/21/2019 67  38 - 126 U/L Final   ??? Magnesium 03/21/2019 1.9  1.6 - 2.2 mg/dL Final   ??? Phosphorus 03/21/2019 3.1  2.9 - 4.7 mg/dL Final   ??? Sodium 63/07/6008 144  135 - 145 mmol/L Final   ??? Potassium 03/21/2019 3.5  3.5 - 5.0 mmol/L Final   ??? Chloride 03/21/2019 112* 98 - 107 mmol/L Final   ??? CO2 03/21/2019 25.0  22.0 - 30.0 mmol/L Final   ??? Anion Gap 03/21/2019 7  7 - 15 mmol/L Final   ??? BUN 03/21/2019 20  7 - 21 mg/dL Final   ??? Creatinine 03/21/2019 0.53* 0.60 - 1.00 mg/dL Final   ??? BUN/Creatinine Ratio 03/21/2019 38   Final   ??? EGFR CKD-EPI Non-African American,* 03/21/2019 >90  >=60 mL/min/1.82m2 Final   ??? EGFR CKD-EPI African American, Fem* 03/21/2019 >90  >=60 mL/min/1.55m2 Final   ??? Glucose 03/21/2019 170  70 - 179 mg/dL Final   ??? Calcium 16/04/9603 9.2  8.5 - 10.2 mg/dL Final   ??? WBC 54/03/8118 0.3* 4.5 - 11.0 10*9/L Final   ??? RBC 03/21/2019 2.63* 4.00 - 5.20 10*12/L Final   ??? HGB 03/21/2019 7.8* 12.0 - 16.0 g/dL Final   ??? HCT 14/78/2956 22.5* 36.0 - 46.0 % Final   ??? MCV 03/21/2019 85.4  80.0 - 100.0 fL Final   ??? MCH 03/21/2019 29.5  26.0 - 34.0 pg Final   ??? MCHC 03/21/2019 34.6  31.0 - 37.0 g/dL Final   ??? RDW 21/30/8657 13.9  12.0 - 15.0 % Final   ??? MPV 03/21/2019 7.5  7.0 - 10.0 fL Final   ??? Platelet 03/21/2019 6* 150 - 440 10*9/L Final    Questionable result confirmed. Specimen integrity/identity confirmed.    ??? Neutrophils % 03/21/2019 13.4  % Final   ??? Lymphocytes % 03/21/2019 78.5  % Final   ??? Monocytes % 03/21/2019 2.8  % Final   ??? Eosinophils % 03/21/2019 1.1  % Final   ??? Basophils % 03/21/2019 0.0  % Final   ??? Absolute Neutrophils 03/21/2019 0.0* 2.0 - 7.5 10*9/L Final   ??? Absolute Lymphocytes 03/21/2019 0.3* 1.5 - 5.0 10*9/L Final   ??? Absolute Monocytes 03/21/2019 0.0* 0.2 - 0.8 10*9/L Final   ??? Absolute Eosinophils 03/21/2019 0.0  0.0 - 0.4 10*9/L Final   ??? Absolute Basophils 03/21/2019 0.0  0.0 - 0.1 10*9/L Final   ??? Large Unstained Cells 03/21/2019 4  0 - 4 % Final   ??? Unit Blood Type 03/22/2019 B Pos   Final   ??? ISBT Number 03/22/2019 7300   Final   ??? Unit # 03/22/2019 [id number]   Final   ??? Status 03/22/2019 Transfused   Final   ??? Product ID 03/22/2019 Platelets   Final   ??? PRODUCT CODE 03/22/2019 [id number]   Final   ??? Platelet 03/21/2019 16* 150 - 440 10*9/L Final   ??? Albumin 03/22/2019 3.0* 3.5 - 5.0 g/dL Final   ??? Total Protein 03/22/2019 5.6* 6.5 - 8.3 g/dL Final   ??? Total Bilirubin 03/22/2019 0.5  0.0 - 1.2 mg/dL Final   ??? Bilirubin, Direct 03/22/2019 <0.10  0.00 - 0.40 mg/dL Final   ??? AST 25/36/6440 71* 14 - 38 U/L Final   ??? ALT 03/22/2019 79* <35 U/L Final   ??? Alkaline Phosphatase 03/22/2019 64  38 - 126 U/L Final   ??? Magnesium 03/22/2019 1.9  1.6 - 2.2 mg/dL Final   ??? Phosphorus 03/22/2019 2.3* 2.9 - 4.7 mg/dL Final   ??? Sodium 34/74/2595 141  135 - 145 mmol/L Final   ??? Potassium 03/22/2019 3.3* 3.5 - 5.0 mmol/L Final   ??? Chloride 03/22/2019 112* 98 - 107 mmol/L Final   ??? CO2 03/22/2019 26.0  22.0 - 30.0 mmol/L Final   ???  Anion Gap 03/22/2019 3* 7 - 15 mmol/L Final   ??? BUN 03/22/2019 18  7 - 21 mg/dL Final   ??? Creatinine 03/22/2019 0.60  0.60 - 1.00 mg/dL Final   ??? BUN/Creatinine Ratio 03/22/2019 30   Final   ??? EGFR CKD-EPI Non-African American,* 03/22/2019 >90  >=60 mL/min/1.6m2 Final   ??? EGFR CKD-EPI African American, Fem* 03/22/2019 >90  >=60 mL/min/1.11m2 Final   ??? Glucose 03/22/2019 177  70 - 179 mg/dL Final   ??? Calcium 09/81/1914 9.0  8.5 - 10.2 mg/dL Final   ??? WBC 78/29/5621 0.2* 4.5 - 11.0 10*9/L Final   ??? RBC 03/22/2019 2.38* 4.00 - 5.20 10*12/L Final   ??? HGB 03/22/2019 7.0* 12.0 - 16.0 g/dL Final   ??? HCT 30/86/5784 20.3* 36.0 - 46.0 % Final   ??? MCV 03/22/2019 85.4  80.0 - 100.0 fL Final   ??? MCH 03/22/2019 29.4  26.0 - 34.0 pg Final   ??? MCHC 03/22/2019 34.5  31.0 - 37.0 g/dL Final   ??? RDW 69/62/9528 14.3  12.0 - 15.0 % Final   ??? MPV 03/22/2019 8.5  7.0 - 10.0 fL Final   ??? Platelet 03/22/2019 10* 150 - 440 10*9/L Final   ??? Neutrophils % 03/22/2019 10.7  % Final   ??? Lymphocytes % 03/22/2019 85.5  % Final   ??? Monocytes % 03/22/2019 0.0  % Final   ??? Eosinophils % 03/22/2019 0.6  % Final   ??? Basophils % 03/22/2019 0.0  % Final   ??? Absolute Neutrophils 03/22/2019 0.0* 2.0 - 7.5 10*9/L Final   ??? Absolute Lymphocytes 03/22/2019 0.2* 1.5 - 5.0 10*9/L Final   ??? Absolute Monocytes 03/22/2019 0.0* 0.2 - 0.8 10*9/L Final   ??? Absolute Eosinophils 03/22/2019 0.0  0.0 - 0.4 10*9/L Final   ??? Absolute Basophils 03/22/2019 0.0  0.0 - 0.1 10*9/L Final   ??? Large Unstained Cells 03/22/2019 3  0 - 4 % Final   ??? ABO Grouping 03/22/2019 O POS   Final ??? Antibody Screen 03/22/2019 NEG   Final   ??? Smear Review Comments 03/22/2019 See Comment* Undefined Final    Smear Reviewed.     ??? Crossmatch 03/23/2019 Compatible   Final   ??? Unit Blood Type 03/23/2019 O Pos   Final   ??? ISBT Number 03/23/2019 5100   Final   ??? Unit # 03/23/2019 [id number]   Final   ??? Status 03/23/2019 Transfused   Final   ??? Product ID 03/23/2019 Red Blood Cells   Final   ??? PRODUCT CODE 03/23/2019 [id number]   Final   ??? Crossmatch 03/23/2019 Compatible   Final   ??? Unit Blood Type 03/23/2019 O Pos   Final   ??? ISBT Number 03/23/2019 5100   Final   ??? Unit # 03/23/2019 [id number]   Final   ??? Status 03/23/2019 Transfused   Final   ??? Product ID 03/23/2019 Red Blood Cells   Final   ??? PRODUCT CODE 03/23/2019 [id number]   Final   ??? Unit Blood Type 03/23/2019 O Pos   Final   ??? ISBT Number 03/23/2019 5100   Final   ??? Unit # 03/23/2019 [id number]   Final   ??? Status 03/23/2019 Transfused   Final   ??? Product ID 03/23/2019 Platelets   Final   ??? PRODUCT CODE 03/23/2019 [id number]   Final   ??? Platelet 03/22/2019 22* 150 - 440 10*9/L Final       Allergies: No Known Allergies  Drug Interactions:   - Posaconazole is a strong 3A4 inhibitor. This can result in increased cyclosporine concentrations. However since CSA level should be closely followed, adjustments can be made to minimize toxicity with this interaction.   - Monitor for QTc prolongation on concomitant posa and other psych meds      Current Medications:  Current Outpatient Medications   Medication Sig Dispense Refill   ??? ALPRAZolam (XANAX) 0.25 MG tablet daily as needed.      ??? aminocaproic acid (AMICAR) 500 mg tablet Take 1 g by mouth two (2) times a day. 2 tablets every 8 hours (1000mg )     ??? calcium carbonate (TUMS) 200 mg calcium (500 mg) chewable tablet Chew 1 tablet (200 mg elem calcium total) Three (3) times a day as needed for heartburn.  0   ??? cycloSPORINE (NEORAL) 100 mg/mL microemulsion solution Take 100 mg by mouth Two (2) times a day. Take in addition to 50 mg (2 x 25 mg capsules) BID     ??? cycloSPORINE modified (NEORAL) 25 MG capsule Take 2 capsules (50 mg total) by mouth Two (2) times a day. 120 capsule 2   ??? folic acid (FOLVITE) 1 MG tablet Take 1 mg by mouth.     ??? levoFLOXacin (LEVAQUIN) 500 MG tablet Take 500 mg by mouth daily.     ??? levonorgestrel (MIRENA) 20 mcg/24 hours (5 yrs) 52 mg IUD 1 each by Intrauterine route.     ??? levothyroxine (SYNTHROID) 100 MCG tablet Take 100 mcg by mouth.     ??? lithium (ESKALITH CR) 450 MG ER tablet Take 900 mg by mouth.     ??? posaconazole (NOXAFIL) 100 mg TbEC delayed released tablet Take  3 tablets (300 mg) by mouth daily. 90 tablet 2   ??? predniSONE (DELTASONE) 20 MG tablet Take 4 tablets (80 mg total) by mouth daily for 3 days, THEN 3 tablets (60 mg total) daily for 3 days, THEN 2 tablets (40 mg total) daily for 3 days, THEN 1 tablet (20 mg total) daily for 7 days, THEN 0.5 tablets (10 mg total) daily for 7 days. 38 tablet 0   ??? sulfamethoxazole-trimethoprim (BACTRIM DS) 800-160 mg per tablet Take 1 tablet (160 mg of trimethoprim total) by mouth 2 times a day on Saturday, Sunday. 16 tablet 0   ??? TRINTELLIX 20 mg tablet Take 20 mg by mouth daily.      ??? valACYclovir (VALTREX) 500 MG tablet Take 500 mg by mouth daily.     ??? zolpidem (AMBIEN) 10 mg tablet        No current facility-administered medications for this visit.        Adherence: Denies missed doses        Assessment: Ms.Titsworth is a 58 y.o. female with CLL and aplastic anemia who is s/p hATG and cyclosporine, continuing at home on cyclosporine and prednisone taper.     Plan:   - Should continue on cyclosporine 150 mg BID.   - She struggled with swallowing the 100 mg capsules, so her prescription was switched locally to a 100 mg BID suspension which she takes in addition to 2 x 25 mg capsules BID for a total of 150 mg BID. The Rx for the suspension is not in Epic and is not clear if it is Neoral vs SandImmune (I suspect Neoral - so I added this to med list). Patient should contact me via MyChart to describe the product so the correct one can be added  -  CSA level on Thursday 9/10 AM - appointments should be adjusted to allow for earlier trough level.   - Patient instructed to hold AM dose prior to lab draws whenever she has an AM lab appointment. She should take dose immediately after blood draw and then if adjustments are needed someone will get ahold of her  - Patient neutropenic (ANC 0.1 on 9/7 at [facility]) - continue ppx with valtrex, bactrim, levaquin, and posa  - Labs have previously looked stable, consider electrolyte repletion if K continue to trend low  - F/u with NP for next available next week. TBD admission?    F/u:  Future Appointments   Date Time Provider Department Center   04/03/2019  2:00 PM ADULT ONC LAB [facility]   04/03/2019  3:00 PM ONCINF CHAIR 31 [id number]   04/08/2019 10:00 AM ADULT ONC LAB [facility]   04/08/2019 11:00 AM Sean Marrian Salvage, AGNP [id number]       I spent 35 minutes with Ms.Kiribati on the phone      Manfred Arch, PharmD, BCOP, CPP  Pager: [phone]

## 2019-04-01 NOTE — Unmapped (Signed)
Hi,     Patient contacted the Communication Center requesting to speak with the care team of KRYSTLE POLCYN to discuss:    Patient says Yvonna Alanis asked her to call and verify liquid medication. The bottle says Cyclosprone oral solution USP modified 100mg . At least that's what I can see.    Please contact patient at [phone].    Check Indicates criteria has been reviewed and confirmed with the patient:    [x]  Preferred Name   [x]  DOB and/or MR#  [x]  Preferred Contact Method  [x]  Phone Number(s)   []  MyChart     Thank you,   Laverna Peace  [facility]   [phone]

## 2019-04-01 NOTE — Unmapped (Signed)
Brand name is TEVA.  Thank you,   Deatra Ina  [facility]  [phone]

## 2019-04-01 NOTE — Telephone Encounter (Signed)
Per Dr. Benay Spice: As long as she has no bleeding, she can forgo the lab draw on 04/02/19. Keep appointment at [facility] on Thursday as scheduled and while there inquire about their pharmacist compounding the amicar solution for her. Patient understands and agrees.

## 2019-04-02 ENCOUNTER — Inpatient Hospital Stay: Payer: BC Managed Care – PPO

## 2019-04-02 LAB — CYCLOSPORINE: Cyclosporine, LabCorp: 199 ng/mL (ref 100–400)

## 2019-04-03 ENCOUNTER — Encounter
Admit: 2019-04-03 | Discharge: 2019-04-04 | Payer: PRIVATE HEALTH INSURANCE | Attending: Adult Health | Primary: Adult Health

## 2019-04-03 ENCOUNTER — Encounter: Admit: 2019-04-03 | Discharge: 2019-04-04 | Payer: PRIVATE HEALTH INSURANCE

## 2019-04-03 ENCOUNTER — Other Ambulatory Visit: Payer: Self-pay | Admitting: *Deleted

## 2019-04-03 ENCOUNTER — Telehealth: Payer: Self-pay | Admitting: *Deleted

## 2019-04-03 DIAGNOSIS — C9110 Chronic lymphocytic leukemia of B-cell type not having achieved remission: Secondary | ICD-10-CM | POA: Diagnosis not present

## 2019-04-03 DIAGNOSIS — E039 Hypothyroidism, unspecified: Secondary | ICD-10-CM | POA: Diagnosis not present

## 2019-04-03 DIAGNOSIS — Z6822 Body mass index (BMI) 22.0-22.9, adult: Secondary | ICD-10-CM | POA: Diagnosis not present

## 2019-04-03 DIAGNOSIS — D801 Nonfamilial hypogammaglobulinemia: Secondary | ICD-10-CM | POA: Diagnosis not present

## 2019-04-03 DIAGNOSIS — D619 Aplastic anemia, unspecified: Secondary | ICD-10-CM | POA: Diagnosis not present

## 2019-04-03 DIAGNOSIS — R161 Splenomegaly, not elsewhere classified: Secondary | ICD-10-CM | POA: Diagnosis not present

## 2019-04-03 DIAGNOSIS — R140 Abdominal distension (gaseous): Secondary | ICD-10-CM | POA: Diagnosis not present

## 2019-04-03 DIAGNOSIS — R109 Unspecified abdominal pain: Secondary | ICD-10-CM | POA: Diagnosis not present

## 2019-04-03 DIAGNOSIS — Z79899 Other long term (current) drug therapy: Secondary | ICD-10-CM | POA: Diagnosis not present

## 2019-04-03 DIAGNOSIS — D696 Thrombocytopenia, unspecified: Secondary | ICD-10-CM

## 2019-04-03 DIAGNOSIS — K5900 Constipation, unspecified: Secondary | ICD-10-CM | POA: Diagnosis not present

## 2019-04-03 DIAGNOSIS — R1030 Lower abdominal pain, unspecified: Secondary | ICD-10-CM

## 2019-04-03 LAB — CBC W/ AUTO DIFF
BASOPHILS ABSOLUTE COUNT: 0 10*9/L (ref 0.0–0.1)
BASOPHILS RELATIVE PERCENT: 0.3 %
EOSINOPHILS ABSOLUTE COUNT: 0 10*9/L (ref 0.0–0.4)
EOSINOPHILS RELATIVE PERCENT: 0.3 %
HEMATOCRIT: 31.9 % — ABNORMAL LOW (ref 36.0–46.0)
HEMOGLOBIN: 10.8 g/dL — ABNORMAL LOW (ref 12.0–16.0)
LARGE UNSTAINED CELLS: 2 % (ref 0–4)
LYMPHOCYTES ABSOLUTE COUNT: 8.8 10*9/L — ABNORMAL HIGH (ref 1.5–5.0)
LYMPHOCYTES RELATIVE PERCENT: 95 %
MEAN CORPUSCULAR HEMOGLOBIN: 28.7 pg (ref 26.0–34.0)
MEAN CORPUSCULAR VOLUME: 85.2 fL (ref 80.0–100.0)
MONOCYTES ABSOLUTE COUNT: 0 10*9/L — ABNORMAL LOW (ref 0.2–0.8)
MONOCYTES RELATIVE PERCENT: 0.1 %
NEUTROPHILS ABSOLUTE COUNT: 0.2 10*9/L — CL (ref 2.0–7.5)
NEUTROPHILS RELATIVE PERCENT: 2.6 %
PLATELET COUNT: 18 10*9/L — ABNORMAL LOW (ref 150–440)
RED BLOOD CELL COUNT: 3.75 10*12/L — ABNORMAL LOW (ref 4.00–5.20)
RED CELL DISTRIBUTION WIDTH: 14.9 % (ref 12.0–15.0)
WBC ADJUSTED: 9.3 10*9/L (ref 4.5–11.0)

## 2019-04-03 LAB — URINALYSIS
BILIRUBIN UA: NEGATIVE
GLUCOSE UA: NEGATIVE
KETONES UA: NEGATIVE
NITRITE UA: NEGATIVE
PH UA: 7 (ref 5.0–9.0)
PROTEIN UA: NEGATIVE
RBC UA: 12 /HPF — ABNORMAL HIGH (ref <=4)
SPECIFIC GRAVITY UA: 1.02 (ref 1.003–1.030)
SQUAMOUS EPITHELIAL: 1 /HPF (ref 0–5)
UROBILINOGEN UA: 4 — AB
WBC UA: 1 /HPF (ref 0–5)

## 2019-04-03 LAB — COMPREHENSIVE METABOLIC PANEL
ALBUMIN: 4.2 g/dL (ref 3.5–5.0)
ALKALINE PHOSPHATASE: 76 U/L (ref 38–126)
ALT (SGPT): 69 U/L — ABNORMAL HIGH (ref <35)
ANION GAP: 6 mmol/L — ABNORMAL LOW (ref 7–15)
AST (SGOT): 42 U/L — ABNORMAL HIGH (ref 14–38)
BLOOD UREA NITROGEN: 28 mg/dL — ABNORMAL HIGH (ref 7–21)
BUN / CREAT RATIO: 35
CALCIUM: 10.3 mg/dL — ABNORMAL HIGH (ref 8.5–10.2)
CO2: 28 mmol/L (ref 22.0–30.0)
CREATININE: 0.8 mg/dL (ref 0.60–1.00)
EGFR CKD-EPI AA FEMALE: >90 mL/min/{1.73_m2} (ref >=60)
EGFR CKD-EPI NON-AA FEMALE: 81 mL/min/{1.73_m2} (ref >=60)
GLUCOSE RANDOM: 108 mg/dL (ref 70–179)
POTASSIUM: 5 mmol/L (ref 3.5–5.0)
PROTEIN TOTAL: 6.5 g/dL (ref 6.5–8.3)
SODIUM: 133 mmol/L — ABNORMAL LOW (ref 135–145)

## 2019-04-03 LAB — MUCUS

## 2019-04-03 LAB — CYCLOSPORINE (FPIA) BLOOD: Lab: 153

## 2019-04-03 LAB — NEUTROPHILS ABSOLUTE COUNT: Neutrophils:NCnc:Pt:Bld:Qn:Automated count: 0.2 — CL

## 2019-04-03 LAB — GLUCOSE RANDOM: Glucose:MCnc:Pt:Ser/Plas:Qn:: 108

## 2019-04-03 LAB — SMEAR REVIEW

## 2019-04-03 MED ORDER — CYCLOSPORINE MODIFIED 100 MG/ML ORAL SOLUTION
Freq: Two times a day (BID) | ORAL | 0 refills | 50.00000 days | Status: CP
Start: 2019-04-03 — End: 2019-05-05

## 2019-04-03 MED ORDER — CYCLOSPORINE MODIFIED 25 MG CAPSULE
ORAL_CAPSULE | Freq: Two times a day (BID) | ORAL | 2 refills | 30.00000 days | Status: CP
Start: 2019-04-03 — End: 2019-05-05

## 2019-04-03 NOTE — Unmapped (Signed)
picc accessed, blood return noted. Labs collected and line saline locked. Pt discharged in stable condition.

## 2019-04-03 NOTE — Unmapped (Signed)
[facility] Triage Note     Patient: Susan Kane     Reason for call:  return call    Time call returned: 0924     Phone Assessment: pt calling to report abdominal pain after taking meds.  Feels like sharp, cramping pains in the lower part of her abdomen, right above the pubic bone that last for 2-3.     Triage Recommendations: Pt has appts for lab and infusion today, RN advised to ask to see or speak to one of their APPS.  RN to call and give heads up     Caller Response: appreciation

## 2019-04-03 NOTE — Unmapped (Signed)
Hi,     Susan Kane has contacted the Communication Center in regards to the following symptom:     Pain: worsening abdominal     Please contact [name] at [phone]    Check Indicates criteria has been reviewed and confirmed with the patient:    [x]  Preferred Name   [x]  DOB and/or MR#  [x]  Preferred Contact Method  [x]  Phone Number(s)   []  MyChart     A page or telephone call has been made to the corresponding clinic.     Thank you,  [name]   [facility]   [phone]

## 2019-04-03 NOTE — Unmapped (Signed)
For your abdominal discomfort-    Increase your prilosec to twice daily. Take it approximately 30 minutes before a meal on an empty tummy.     Sit up for 30 minutes to 1 hour after all meals.     You can try simethicone (Gas-X) per package instructions for abdominal bloating, distention, or gas pain.     You can also try acetaminophen (tylenol) per package instructions for abdominal pain.    For constipation you can utilize the following products-    Docusate sodium (colace) 100mg  by mouth twice a day (may decrease if liquid stool)    Polyethylene glycol (miralax) Drink one capful (17 grams) in 4-8 ounces of warm or cold beverage once a day (may take up to three times a day for constipation)     Senna Take 2 tablets po once daily, preferable at bedtime if no BM during day    Magnesium Citrate ?? bottle for no bowel movement in 3-5 days    GO to emergency department for fever > 100.81F or uncontrolled pain.    Continue to take you medications as you have been. You have done a great job dividing them throughout the day to avoid taking too many at once!    If your abdominal pain persists please notify your oncology team!    Hospital Outpatient Visit on 04/03/2019   Component Date Value Ref Range Status   ??? WBC 04/03/2019 9.3  4.5 - 11.0 10*9/L Final   ??? RBC 04/03/2019 3.75* 4.00 - 5.20 10*12/L Final   ??? HGB 04/03/2019 10.8* 12.0 - 16.0 g/dL Final   ??? HCT 16/04/9603 31.9* 36.0 - 46.0 % Final   ??? MCV 04/03/2019 85.2  80.0 - 100.0 fL Final   ??? MCH 04/03/2019 28.7  26.0 - 34.0 pg Final   ??? MCHC 04/03/2019 33.7  31.0 - 37.0 g/dL Final   ??? RDW 54/03/8118 14.9  12.0 - 15.0 % Final   ??? MPV 04/03/2019 7.2  7.0 - 10.0 fL Final   ??? Platelet 04/03/2019 18* 150 - 440 10*9/L Final   ??? Neutrophils % 04/03/2019 2.6  % Final   ??? Lymphocytes % 04/03/2019 95.0  % Final   ??? Monocytes % 04/03/2019 0.1  % Final   ??? Eosinophils % 04/03/2019 0.3  % Final   ??? Basophils % 04/03/2019 0.3  % Final   ??? Absolute Neutrophils 04/03/2019 0.2* 2.0 - 7.5 10*9/L Final   ??? Absolute Lymphocytes 04/03/2019 8.8* 1.5 - 5.0 10*9/L Final   ??? Absolute Monocytes 04/03/2019 0.0* 0.2 - 0.8 10*9/L Final   ??? Absolute Eosinophils 04/03/2019 0.0  0.0 - 0.4 10*9/L Final   ??? Absolute Basophils 04/03/2019 0.0  0.0 - 0.1 10*9/L Final   ??? Large Unstained Cells 04/03/2019 2  0 - 4 % Final   Lab on 04/03/2019   Component Date Value Ref Range Status   ??? ABO Grouping 04/03/2019 O POS   Final   ??? Antibody Screen 04/03/2019 NEG   Final   ??? Color, UA 04/03/2019 Yellow   Final   ??? Clarity, UA 04/03/2019 Clear   Final   ??? Specific Gravity, UA 04/03/2019 1.020  1.003 - 1.030 Final   ??? pH, UA 04/03/2019 7.0  5.0 - 9.0 Final   ??? Leukocyte Esterase, UA 04/03/2019 Negative  Negative Final   ??? Nitrite, UA 04/03/2019 Negative  Negative Final   ??? Protein, UA 04/03/2019 Negative  Negative Final   ??? Glucose, UA 04/03/2019 Negative  Negative Final   ??? Ketones, UA 04/03/2019 Negative  Negative Final   ??? Urobilinogen, UA 04/03/2019 4.0 mg/dL* 0.2 mg/dL, 1.0 mg/dL Final   ??? Bilirubin, UA 04/03/2019 Negative  Negative Final   ??? Blood, UA 04/03/2019 Small* Negative Final   ??? RBC, UA 04/03/2019 12* <=4 /HPF Final   ??? WBC, UA 04/03/2019 1  0 - 5 /HPF Final   ??? Squam Epithel, UA 04/03/2019 1  0 - 5 /HPF Final   ??? Bacteria, UA 04/03/2019 Rare* None Seen /HPF Final   ??? Mucus, UA 04/03/2019 Rare* None Seen /HPF Final   ??? Sodium 04/03/2019 133* 135 - 145 mmol/L Final   ??? Potassium 04/03/2019 5.0  3.5 - 5.0 mmol/L Final   ??? Chloride 04/03/2019 99  98 - 107 mmol/L Final   ??? Anion Gap 04/03/2019 6* 7 - 15 mmol/L Final   ??? CO2 04/03/2019 28.0  22.0 - 30.0 mmol/L Final   ??? BUN 04/03/2019 28* 7 - 21 mg/dL Final   ??? Creatinine 04/03/2019 0.80  0.60 - 1.00 mg/dL Final   ??? BUN/Creatinine Ratio 04/03/2019 35   Final   ??? EGFR CKD-EPI Non-African American,* 04/03/2019 81  >=60 mL/min/1.53m2 Final   ??? EGFR CKD-EPI African American, Fem* 04/03/2019 >90  >=60 mL/min/1.20m2 Final   ??? Glucose 04/03/2019 108  70 - 179 mg/dL Final   ??? Calcium 40/98/1191 10.3* 8.5 - 10.2 mg/dL Final   ??? Albumin 47/82/9562 4.2  3.5 - 5.0 g/dL Final   ??? Total Protein 04/03/2019 6.5  6.5 - 8.3 g/dL Final   ??? Total Bilirubin 04/03/2019 1.0  0.0 - 1.2 mg/dL Final   ??? AST 13/02/6577 42* 14 - 38 U/L Final   ??? ALT 04/03/2019 69* <35 U/L Final   ??? Alkaline Phosphatase 04/03/2019 76  38 - 126 U/L Final     If you feel like this is an emergency please call 911.  For appointments or questions Monday through Friday 8AM-5PM please call [phone] or Toll Free [phone]. For Medical questions or concerns ask for the Nurse Triage Line.  On Nights, Weekends, and Holidays call [phone] and ask for the Oncologist on Call.  Reasons to call the Nurse Triage Line:  Fever of 100.5 or greater  Nausea and/or vomiting not relived with nausea medicine  Diarrhea or constipation  Severe pain not relieved with usual pain regimen  Shortness of breath  Uncontrolled bleeding  Mental status changes

## 2019-04-03 NOTE — Unmapped (Signed)
I called Ms. Swartzendruber to discuss her cyclosporine level. She is taking 100 mg BID of the modified suspension and 50 mg BID of the 25 mg capsules for a total of 150 mg BID.     Today I received a level that came back from [facility] as a trough 3 days ago and was 199 (goal 200-400). Today getting labs here at [facility], her level came back at 153 but drawn a couple hours after an ideal trough level. As she is at the very low end of her target range, we will increase her dose to 175 mg BID. I sent refills with the appropriate dose to her local pharmacy. Patient agreed to inform her local oncologist of the dose increase.    She should continue to get weekly cyclosporine levels locally and I will continue to stay in touch with them regarding levels and dosing adjustments. She has in person follow-up here next week.       Manfred Arch, PharmD, BCOP, CPP  Pager: [phone]

## 2019-04-03 NOTE — Unmapped (Signed)
Pt here for lab check, numbers are good, does not need transfusion. NP paged for abdominal pain and is assessing patient now.     Pt did not need transfusion; seen by NP - discharged from clinic w/ instructions from NP; stable and ambulatory, accompanied by her husband.

## 2019-04-03 NOTE — Unmapped (Signed)
Infusion Center Progress Note    Patient Name: Susan Kane  Patient Age: 58 y.o.  Encounter Date: 04/03/2019    Reason for visit  Chief Complaint: abdominal pain    I spent at least 10 minutes with this patient: assessing, performing physical exam with > 50% of time spent in counseling.      Assessment/Plan:     Abdominal pain/bloating/pressure-  -UA- negative  -Increase prilosec to BID  -remain upright 57min-1hour after meals  -Eat small, bland easy to digest foods  -Try OTC simethicone  -Try tylenol prn abdominal pain  - Instructions provided for bowel regimen to treat constipation    Susan Kane reports understanding instructions and changes to medication administration discussed. She will try the recommendations and understands to report to her primary team if her symptoms worsen or if she continues to have pain. She is appreciative of the visit and will continue her medications as prescribed until she hears about her cyclosporine level.    Subjective/HPI:  Susan Kane is a 58 y.o. female patient with aplastic anemia and CLL seen in infusion during a therapeutic transfusion appointment with report of abdominal pain, bloating, pressure (she equates to gas/possible constipation) after taking her medications. She also has some mild nausea after taking her medications that she does not take any nausea medication to treat currently.    She reports these symptoms have been occurring since starting her current medication regimen but at a milder level and had resolved at a quicker rate. Currently, she feels that about 30 minutes after she takes her medications she feels abdominal pain and fullness that sends her to lay down and resolves about 2-4 hours later. After it resolves, it is again time to take medications and the pain and fullness starts over again. This has been happening since Monday. She reports she recently started taking prilosec in the AM, has utilized tums approximately twice a day with mild relief, and thinks she may be constipated as her last bowel movement 2 days ago was very firm.     She denies vomiting, diarrhea, fever, chills, chest pain, cough, SOB, bleeding, and any other new or worrisome symptoms.    Oncology History:  Oncology History Overview Note   CLL:    Summary as per Dr. Kalman Drape most recent note with minor edits/additions from my review of records    1.??CLL?????diagnosed in August 2010, flow cytometry consistent with CLL  ?? Enlarged left??inguinal lymph node January 2019,??small??neck/axillary nodes and palpable splenomegaly 09/11/2017  ?? CTs??on 09/17/2017-3 cm necrotic appearing lymph node in the left inguinal region, borderline enlarged pelvic/retroperitoneal, chest, and axillary nodes. ??Mild splenomegaly.  ?? Ultrasound-guided biopsy of the left inguinal lymph node 09/18/2017, slightly purulent fluid aspirated, core biopsy is consistent with an atypical lymphoid proliferation???extensive necrosis with surrounding epithelioid histiocytes, limited intact lymphoid tissue involved with CLL  ?? Incisional biopsy of a necrotic/purulent left inguinal lymph node on 10/01/2017???extensive necrosis with granulomatous inflammation, small amount of viable lymphoid tissue involved with CLL, AFB and fungal stains negative  ?? Peripheral blood FISH analysis 02/05/2018??? +deletion 13q14, no evidence of p53 (17p13) deletion, no evidence of 11q22??deletion  - normal karyotype (46,XX)  ?? Bone marrow biopsy 02/26/2018???hypercellular marrow with extensive involvement by CLL, lymphocytes represent??85% of all cells  ?? Ibrutinib initiated 04/03/2018  ?? Ibrutinib placed on hold 04/11/2018 due to onset of arthralgias  ?? Ibrutinib resumed 04/16/2018, discontinued 04/25/2018 secondary to severe arthralgias/arthritis  ?? Ibrutinib resumed at a dose of 140 mg daily 05/03/2018  ??  Ibrutinib dose adjusted to 140 mg alternating with 280 mg 06/25/2018  ?? 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 ??Augmentin prescribed.  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    Baseline BM bx reviewed at [facility]  Final Diagnosis   (Outside Case #:  [id number], dated 02/26/2018)  Bone marrow, aspiration and biopsy  -  Hypercellular bone marrow (80%) with extensive involvement by chronic lymphocytic leukemia (87% lymphocytes by manual aspirate differential)  (See Comment)  -  By outside report, cytogenetic results are normal     Karyotype: 80, XX and FISH with 13q del but no 11q or 17p del    Repeat BM bx: (done after being off ibrutinib x1 month)  80% involvement by CLL    08/13/18: Presents to [facility] to discuss plan of care     Chronic lymphocytic leukemia (CLL), B-cell (CMS-HCC)   07/12/2018 Initial Diagnosis    Chronic lymphocytic leukemia (CLL), B-cell (CMS-HCC)     08/26/2018 -  Chemotherapy    Acalabrutinib started (approximate date)     11/14/2018 -  Chemotherapy    Acalabrutinib discontinued due to progressive bone marrow failure     12/06/2018 -  Chemotherapy    Ritux x1 given due to CLL still making up majority of BM cellularity (though in the setting of near aplasia of rest of TLH)         Allergies:  No Known Allergies  Medications:  Prior to Admission medications    Medication Dose, Route, Frequency   ALPRAZolam (XANAX) 0.25 MG tablet Daily PRN   aminocaproic acid (AMICAR) 500 mg tablet 1 g, Oral, 2 times a day, 2 tablets every 8 hours (1000mg )   calcium carbonate (TUMS) 200 mg calcium (500 mg) chewable tablet 200 mg elem calcium, Oral, 3 times a day PRN   cycloSPORINE (NEORAL) 100 mg/mL microemulsion solution 100 mg, Oral, 2 times a day (standard), Take in addition to 50 mg (2 x 25 mg capsules) BID    cycloSPORINE modified (NEORAL) 25 MG capsule 50 mg, Oral, 2 times a day (standard)   folic acid (FOLVITE) 1 MG tablet 1 mg, Oral   levoFLOXacin (LEVAQUIN) 500 MG tablet 500 mg, Oral, Daily   levonorgestrel (MIRENA) 20 mcg/24 hours (5 yrs) 52 mg IUD 1 each, Intrauterine   levothyroxine (SYNTHROID) 100 MCG tablet 100 mcg, Oral   lithium (ESKALITH CR) 450 MG ER tablet 900 mg, Oral   posaconazole (NOXAFIL) 100 mg TbEC delayed released tablet Take  3 tablets (300 mg) by mouth daily.   predniSONE (DELTASONE) 20 MG tablet Take 4 tablets (80 mg total) by mouth daily for 3 days, THEN 3 tablets (60 mg total) daily for 3 days, THEN 2 tablets (40 mg total) daily for 3 days, THEN 1 tablet (20 mg total) daily for 7 days, THEN 0.5 tablets (10 mg total) daily for 7 days.   sulfamethoxazole-trimethoprim (BACTRIM DS) 800-160 mg per tablet 1 tablet, Oral, 2 times a day on saturday, sunday   TRINTELLIX 20 mg tablet 20 mg, Oral, Daily (standard)   valACYclovir (VALTREX) 500 MG tablet 500 mg, Oral, Daily   zolpidem (AMBIEN) 10 mg tablet No dose, route, or frequency recorded.       Review of Systems:  A complete review of systems was obtained including: General/Constitutional, Cardiovascular, Gastrointestinal, Genitourinary, Musculoskeletal, Neurological, Endocrine, Allergic/Immunologic systems. It is negative or non-contributory to the patient???s management except for positives mentioned in HPI.     Physical Exam:  Vital Signs:  Vitals:    04/03/19 1210   BP: 114/70   Pulse: 81   Resp: 18   Temp: 36.8 ??C (98.2 ??F)   TempSrc: Oral   Weight: 63.5 kg (139 lb 15.9 oz)       General:               resting comfortably, in no acute distress  Cardiovascular:   S1S2 regular rate and rhythm. no murmurs, rubs, or gallops.  Respiratory:         clear to auscultation bilaterally no wheezes, rhonchi, or rales  Gastrointestinal:  soft, non-distended, non-tender to palpation, normal bowel sounds and rounded  Extremities: extremities are warm and without edema  Psychiatric:          appropriate and full range of affect  Neuro:                 alert & oriented x 3, normal gait and station      Results:  Hospital Outpatient Visit on 04/03/2019   Component Date Value Ref Range Status   ??? WBC 04/03/2019 9.3  4.5 - 11.0 10*9/L Final   ??? RBC 04/03/2019 3.75* 4.00 - 5.20 10*12/L Final   ??? HGB 04/03/2019 10.8* 12.0 - 16.0 g/dL Final   ??? HCT 47/82/9562 31.9* 36.0 - 46.0 % Final   ??? MCV 04/03/2019 85.2  80.0 - 100.0 fL Final   ??? MCH 04/03/2019 28.7  26.0 - 34.0 pg Final   ??? MCHC 04/03/2019 33.7  31.0 - 37.0 g/dL Final   ??? RDW 13/02/6577 14.9  12.0 - 15.0 % Final   ??? MPV 04/03/2019 7.2  7.0 - 10.0 fL Final   ??? Platelet 04/03/2019 18* 150 - 440 10*9/L Final   ??? Neutrophils % 04/03/2019 2.6  % Final   ??? Lymphocytes % 04/03/2019 95.0  % Final   ??? Monocytes % 04/03/2019 0.1  % Final   ??? Eosinophils % 04/03/2019 0.3  % Final   ??? Basophils % 04/03/2019 0.3  % Final   ??? Absolute Neutrophils 04/03/2019 0.2* 2.0 - 7.5 10*9/L Final   ??? Absolute Lymphocytes 04/03/2019 8.8* 1.5 - 5.0 10*9/L Final   ??? Absolute Monocytes 04/03/2019 0.0* 0.2 - 0.8 10*9/L Final   ??? Absolute Eosinophils 04/03/2019 0.0  0.0 - 0.4 10*9/L Final   ??? Absolute Basophils 04/03/2019 0.0  0.0 - 0.1 10*9/L Final   ??? Large Unstained Cells 04/03/2019 2  0 - 4 % Final   ??? Smear Review Comments 04/03/2019 See Comment* Undefined Final    Smudge Cells present.     Lab on 04/03/2019   Component Date Value Ref Range Status   ??? ABO Grouping 04/03/2019 O POS   Final   ??? Antibody Screen 04/03/2019 NEG   Final   ??? Color, UA 04/03/2019 Yellow   Final   ??? Clarity, UA 04/03/2019 Clear   Final   ??? Specific Gravity, UA 04/03/2019 1.020  1.003 - 1.030 Final   ??? pH, UA 04/03/2019 7.0  5.0 - 9.0 Final   ??? Leukocyte Esterase, UA 04/03/2019 Negative  Negative Final   ??? Nitrite, UA 04/03/2019 Negative  Negative Final   ??? Protein, UA 04/03/2019 Negative  Negative Final   ??? Glucose,  UA 04/03/2019 Negative Negative Final   ??? Ketones, UA 04/03/2019 Negative  Negative Final   ??? Urobilinogen, UA 04/03/2019 4.0 mg/dL* 0.2 mg/dL, 1.0 mg/dL Final   ??? Bilirubin, UA 04/03/2019 Negative  Negative Final   ??? Blood, UA 04/03/2019 Small* Negative Final   ??? RBC, UA 04/03/2019 12* <=4 /HPF Final   ??? WBC, UA 04/03/2019 1  0 - 5 /HPF Final   ??? Squam Epithel, UA 04/03/2019 1  0 - 5 /HPF Final   ??? Bacteria, UA 04/03/2019 Rare* None Seen /HPF Final   ??? Mucus, UA 04/03/2019 Rare* None Seen /HPF Final   ??? Cyclosporine, Timed 04/03/2019 153  ng/mL Final   ??? Sodium 04/03/2019 133* 135 - 145 mmol/L Final   ??? Potassium 04/03/2019 5.0  3.5 - 5.0 mmol/L Final   ??? Chloride 04/03/2019 99  98 - 107 mmol/L Final   ??? Anion Gap 04/03/2019 6* 7 - 15 mmol/L Final   ??? CO2 04/03/2019 28.0  22.0 - 30.0 mmol/L Final   ??? BUN 04/03/2019 28* 7 - 21 mg/dL Final   ??? Creatinine 04/03/2019 0.80  0.60 - 1.00 mg/dL Final   ??? BUN/Creatinine Ratio 04/03/2019 35   Final   ??? EGFR CKD-EPI Non-African American,* 04/03/2019 81  >=60 mL/min/1.20m2 Final   ??? EGFR CKD-EPI African American, Fem* 04/03/2019 >90  >=60 mL/min/1.45m2 Final   ??? Glucose 04/03/2019 108  70 - 179 mg/dL Final   ??? Calcium 16/04/9603 10.3* 8.5 - 10.2 mg/dL Final   ??? Albumin 54/03/8118 4.2  3.5 - 5.0 g/dL Final   ??? Total Protein 04/03/2019 6.5  6.5 - 8.3 g/dL Final   ??? Total Bilirubin 04/03/2019 1.0  0.0 - 1.2 mg/dL Final   ??? AST 14/78/2956 42* 14 - 38 U/L Final   ??? ALT 04/03/2019 69* <35 U/L Final   ??? Alkaline Phosphatase 04/03/2019 76  38 - 126 U/L Final         Rudi Coco, AGNP

## 2019-04-03 NOTE — Progress Notes (Signed)
Per Dr. Benay Spice: Will need random platelets on Saturday. Orders placed and blood bank notified.

## 2019-04-03 NOTE — Telephone Encounter (Addendum)
Faxed cyclosporin results to [facility] [phone] and called to confirm receipt. Dr. Benay Spice reviewed labs/visit from [facility] today. Hgb 10.8 and platelets 18,000 with ANC 0.2. She received no blood product today. Per Dr. Benay Spice: Needs CBC/diff when she comes in on Saturday. Order placed and will provide infusion with order/tubes for Saturday collection and to transport to [facility] lab.

## 2019-04-04 ENCOUNTER — Inpatient Hospital Stay: Payer: BC Managed Care – PPO

## 2019-04-04 ENCOUNTER — Inpatient Hospital Stay: Payer: BC Managed Care – PPO | Admitting: Oncology

## 2019-04-04 ENCOUNTER — Telehealth: Payer: Self-pay | Admitting: *Deleted

## 2019-04-04 NOTE — Telephone Encounter (Signed)
Confirmed with patient she will be her on Saturday at 11:00 am for CBC draw from PICC and transported to lab. MD wanted to check this since she did not need blood or platelets on Thursday at [facility].  Orders are to transfuse if platelets are < 15,000. Blood bank notified and will have pack of random platelets available.

## 2019-04-05 ENCOUNTER — Inpatient Hospital Stay: Payer: BC Managed Care – PPO

## 2019-04-05 ENCOUNTER — Other Ambulatory Visit: Payer: Self-pay

## 2019-04-05 DIAGNOSIS — E039 Hypothyroidism, unspecified: Secondary | ICD-10-CM | POA: Diagnosis not present

## 2019-04-05 DIAGNOSIS — D696 Thrombocytopenia, unspecified: Secondary | ICD-10-CM

## 2019-04-05 DIAGNOSIS — R109 Unspecified abdominal pain: Secondary | ICD-10-CM | POA: Diagnosis not present

## 2019-04-05 DIAGNOSIS — D61818 Other pancytopenia: Secondary | ICD-10-CM | POA: Diagnosis not present

## 2019-04-05 DIAGNOSIS — R5381 Other malaise: Secondary | ICD-10-CM | POA: Diagnosis not present

## 2019-04-05 DIAGNOSIS — R233 Spontaneous ecchymoses: Secondary | ICD-10-CM | POA: Diagnosis not present

## 2019-04-05 DIAGNOSIS — C9110 Chronic lymphocytic leukemia of B-cell type not having achieved remission: Secondary | ICD-10-CM

## 2019-04-05 DIAGNOSIS — Z23 Encounter for immunization: Secondary | ICD-10-CM | POA: Diagnosis not present

## 2019-04-05 DIAGNOSIS — Z79899 Other long term (current) drug therapy: Secondary | ICD-10-CM | POA: Diagnosis not present

## 2019-04-05 DIAGNOSIS — K068 Other specified disorders of gingiva and edentulous alveolar ridge: Secondary | ICD-10-CM | POA: Diagnosis not present

## 2019-04-05 DIAGNOSIS — D801 Nonfamilial hypogammaglobulinemia: Secondary | ICD-10-CM | POA: Diagnosis not present

## 2019-04-05 LAB — CBC WITH DIFFERENTIAL (CANCER CENTER ONLY)
Abs Immature Granulocytes: 0 10*3/uL (ref 0.00–0.07)
Basophils Absolute: 0 10*3/uL (ref 0.0–0.1)
Basophils Relative: 0 %
Eosinophils Absolute: 0 10*3/uL (ref 0.0–0.5)
Eosinophils Relative: 0 %
HCT: 28.8 % — ABNORMAL LOW (ref 36.0–46.0)
Hemoglobin: 9.3 g/dL — ABNORMAL LOW (ref 12.0–15.0)
Immature Granulocytes: 0 %
Lymphocytes Relative: 99 %
Lymphs Abs: 6.5 10*3/uL — ABNORMAL HIGH (ref 0.7–4.0)
MCH: 27.7 pg (ref 26.0–34.0)
MCHC: 32.3 g/dL (ref 30.0–36.0)
MCV: 85.7 fL (ref 80.0–100.0)
Monocytes Absolute: 0 10*3/uL — ABNORMAL LOW (ref 0.1–1.0)
Monocytes Relative: 0 %
Neutro Abs: 0.1 10*3/uL — ABNORMAL LOW (ref 1.7–7.7)
Neutrophils Relative %: 1 %
Platelet Count: 8 10*3/uL — CL (ref 150–400)
RBC: 3.36 MIL/uL — ABNORMAL LOW (ref 3.87–5.11)
RDW: 12.9 % (ref 11.5–15.5)
WBC Count: 6.6 10*3/uL (ref 4.0–10.5)
nRBC: 0 % (ref 0.0–0.2)

## 2019-04-05 MED ORDER — HEPARIN SOD (PORK) LOCK FLUSH 100 UNIT/ML IV SOLN
250.0000 [IU] | INTRAVENOUS | Status: AC | PRN
  Administered 2019-04-05: 250 [IU]
  Filled 2019-04-05: qty 5

## 2019-04-05 MED ORDER — SODIUM CHLORIDE 0.9% FLUSH
10.0000 mL | INTRAVENOUS | Status: AC | PRN
  Administered 2019-04-05: 10 mL
  Filled 2019-04-05: qty 10

## 2019-04-05 MED ORDER — SODIUM CHLORIDE 0.9% IV SOLUTION
250.0000 mL | Freq: Once | INTRAVENOUS | Status: AC
  Filled 2019-04-05: qty 250

## 2019-04-06 LAB — PREPARE PLATELET PHERESIS: Unit division: 0

## 2019-04-06 LAB — BPAM PLATELET PHERESIS
Blood Product Expiration Date: 202009132359
ISSUE DATE / TIME: 202009121200
Unit Type and Rh: 7300

## 2019-04-07 ENCOUNTER — Other Ambulatory Visit: Payer: BC Managed Care – PPO

## 2019-04-07 ENCOUNTER — Inpatient Hospital Stay: Payer: BC Managed Care – PPO

## 2019-04-07 ENCOUNTER — Telehealth: Payer: Self-pay | Admitting: *Deleted

## 2019-04-07 ENCOUNTER — Other Ambulatory Visit: Payer: Self-pay | Admitting: *Deleted

## 2019-04-07 ENCOUNTER — Inpatient Hospital Stay (HOSPITAL_BASED_OUTPATIENT_CLINIC_OR_DEPARTMENT_OTHER): Payer: BC Managed Care – PPO | Admitting: Oncology

## 2019-04-07 ENCOUNTER — Other Ambulatory Visit: Payer: Self-pay

## 2019-04-07 VITALS — BP 112/76 | HR 93 | Temp 99.1°F | Resp 20 | Wt 140.7 lb

## 2019-04-07 DIAGNOSIS — D696 Thrombocytopenia, unspecified: Secondary | ICD-10-CM

## 2019-04-07 DIAGNOSIS — R5381 Other malaise: Secondary | ICD-10-CM | POA: Diagnosis not present

## 2019-04-07 DIAGNOSIS — Z95828 Presence of other vascular implants and grafts: Secondary | ICD-10-CM

## 2019-04-07 DIAGNOSIS — Z79899 Other long term (current) drug therapy: Secondary | ICD-10-CM | POA: Diagnosis not present

## 2019-04-07 DIAGNOSIS — C9110 Chronic lymphocytic leukemia of B-cell type not having achieved remission: Secondary | ICD-10-CM

## 2019-04-07 DIAGNOSIS — D649 Anemia, unspecified: Secondary | ICD-10-CM

## 2019-04-07 DIAGNOSIS — D801 Nonfamilial hypogammaglobulinemia: Secondary | ICD-10-CM | POA: Diagnosis not present

## 2019-04-07 DIAGNOSIS — K068 Other specified disorders of gingiva and edentulous alveolar ridge: Secondary | ICD-10-CM | POA: Diagnosis not present

## 2019-04-07 DIAGNOSIS — R109 Unspecified abdominal pain: Secondary | ICD-10-CM | POA: Diagnosis not present

## 2019-04-07 DIAGNOSIS — R233 Spontaneous ecchymoses: Secondary | ICD-10-CM | POA: Diagnosis not present

## 2019-04-07 DIAGNOSIS — Z23 Encounter for immunization: Secondary | ICD-10-CM | POA: Diagnosis not present

## 2019-04-07 DIAGNOSIS — D61818 Other pancytopenia: Secondary | ICD-10-CM | POA: Diagnosis not present

## 2019-04-07 DIAGNOSIS — E039 Hypothyroidism, unspecified: Secondary | ICD-10-CM | POA: Diagnosis not present

## 2019-04-07 LAB — CMP (CANCER CENTER ONLY)
ALT: 54 U/L — ABNORMAL HIGH (ref 0–44)
AST: 28 U/L (ref 15–41)
Albumin: 3.4 g/dL — ABNORMAL LOW (ref 3.5–5.0)
Alkaline Phosphatase: 77 U/L (ref 38–126)
Anion gap: 8 (ref 5–15)
BUN: 18 mg/dL (ref 6–20)
CO2: 22 mmol/L (ref 22–32)
Calcium: 9.3 mg/dL (ref 8.9–10.3)
Chloride: 107 mmol/L (ref 98–111)
Creatinine: 0.9 mg/dL (ref 0.44–1.00)
GFR, Est AFR Am: >60 mL/min (ref >60)
GFR, Estimated: >60 mL/min (ref >60)
Glucose, Bld: 147 mg/dL — ABNORMAL HIGH (ref 70–99)
Potassium: 4.2 mmol/L (ref 3.5–5.1)
Sodium: 137 mmol/L (ref 135–145)
Total Bilirubin: 0.6 mg/dL (ref 0.3–1.2)
Total Protein: 6.1 g/dL — ABNORMAL LOW (ref 6.5–8.1)

## 2019-04-07 LAB — SAMPLE TO BLOOD BANK

## 2019-04-07 LAB — CBC WITH DIFFERENTIAL (CANCER CENTER ONLY)
Abs Immature Granulocytes: 0 10*3/uL (ref 0.00–0.07)
Basophils Absolute: 0 10*3/uL (ref 0.0–0.1)
Basophils Relative: 0 %
Eosinophils Absolute: 0 10*3/uL (ref 0.0–0.5)
Eosinophils Relative: 0 %
HCT: 24.9 % — ABNORMAL LOW (ref 36.0–46.0)
Hemoglobin: 8.4 g/dL — ABNORMAL LOW (ref 12.0–15.0)
Immature Granulocytes: 0 %
Lymphocytes Relative: 97 %
Lymphs Abs: 4.6 10*3/uL — ABNORMAL HIGH (ref 0.7–4.0)
MCH: 28.1 pg (ref 26.0–34.0)
MCHC: 33.7 g/dL (ref 30.0–36.0)
MCV: 83.3 fL (ref 80.0–100.0)
Monocytes Absolute: 0 10*3/uL — ABNORMAL LOW (ref 0.1–1.0)
Monocytes Relative: 1 %
Neutro Abs: 0.1 10*3/uL — CL (ref 1.7–7.7)
Neutrophils Relative %: 2 %
Platelet Count: 16 10*3/uL — ABNORMAL LOW (ref 150–400)
RBC: 2.99 MIL/uL — ABNORMAL LOW (ref 3.87–5.11)
RDW: 12.5 % (ref 11.5–15.5)
WBC Count: 4.8 10*3/uL (ref 4.0–10.5)
nRBC: 0 % (ref 0.0–0.2)

## 2019-04-07 LAB — PREPARE RBC (CROSSMATCH)

## 2019-04-07 MED ORDER — SODIUM CHLORIDE 0.9% IV SOLUTION
250.0000 mL | Freq: Once | INTRAVENOUS | Status: AC
  Administered 2019-04-07: 250 mL via INTRAVENOUS
  Filled 2019-04-07: qty 250

## 2019-04-07 MED ORDER — SODIUM CHLORIDE 0.9% FLUSH
10.0000 mL | INTRAVENOUS | Status: AC | PRN
  Administered 2019-04-07: 10 mL
  Filled 2019-04-07: qty 10

## 2019-04-07 MED ORDER — SODIUM CHLORIDE 0.9% IV SOLUTION
250.0000 mL | Freq: Once | INTRAVENOUS | Status: DC
  Filled 2019-04-07: qty 250

## 2019-04-07 MED ORDER — HEPARIN SOD (PORK) LOCK FLUSH 100 UNIT/ML IV SOLN
250.0000 [IU] | INTRAVENOUS | Status: AC | PRN
  Administered 2019-04-07: 250 [IU]
  Filled 2019-04-07: qty 5

## 2019-04-07 MED ORDER — SODIUM CHLORIDE 0.9% FLUSH
10.0000 mL | INTRAVENOUS | Status: DC | PRN
  Administered 2019-04-07: 10 mL
  Filled 2019-04-07: qty 10

## 2019-04-07 NOTE — Progress Notes (Unsigned)
Notified infusion nurse to draw another blood bank tube for crossmatched platelets to be given on 04/09/19. Orders entered and blood bank notified.

## 2019-04-07 NOTE — Telephone Encounter (Signed)
CRITICAL VALUE STICKER  CRITICAL VALUE: ANC = 0.1  RECEIVER (on-site recipient of call): Cherylynn Ridges RN, Triage [place] NOTIFIED: 04/07/2019 at [place] (representative from lab): Anderson Malta MT Myles Gip  MD NOTIFIED: Dr. Benay Spice  TIME OF NOTIFICATION: 04/07/2019 at 0856  RESPONSE: None.  Reports patient S/P F/U today.

## 2019-04-07 NOTE — Progress Notes (Signed)
Notified blood bank that she will need 1 unit blood today.

## 2019-04-07 NOTE — Progress Notes (Signed)
[place] OFFICE PROGRESS NOTE   Diagnosis: CLL, aplastic anemia  INTERVAL HISTORY:   Meagan Walsh returns for a scheduled visit.  She continues cyclosporine and a prednisone taper.  She remains on antibiotic prophylaxis.  She received platelets 04/05/2019 when the platelet count returned at 8000.  No fever or bleeding. She has noted intermittent abdominal pain since discharge from the hospital at [facility].  She started Prilosec last week.  The pain is partially improved today.  She reports malaise, no dyspnea.  No palpable lymph nodes.  Objective:  Vital signs in last 24 hours:  Blood pressure 112/76, pulse 93, temperature 99.1 F (37.3 C), temperature source Oral, resp. rate 20, weight 140 lb 11.2 oz (63.8 kg).    HEENT: No thrush.  Small ecchymoses at the buccal mucosa bilaterally, no active bleeding Lymphatics: No cervical or supraclavicular nodes Resp: Lungs clear bilaterally Cardio: Regular rate and rhythm GI: Nontender, no mass, no hepatosplenomegaly Vascular: No leg edema  Skin: Several ecchymoses at the lower legs  Portacath/PICC-without erythema  Lab Results:  Lab Results  Component Value Date   WBC 4.8 04/07/2019   HGB 8.4 (L) 04/07/2019   HCT 24.9 (L) 04/07/2019   MCV 83.3 04/07/2019   PLT 16 (L) 04/07/2019   NEUTROABS 0.1 (LL) 04/07/2019    CMP  Lab Results  Component Value Date   NA 137 04/07/2019   K 4.2 04/07/2019   CL 107 04/07/2019   CO2 22 04/07/2019   GLUCOSE 147 (H) 04/07/2019   BUN 18 04/07/2019   CREATININE 0.90 04/07/2019   CALCIUM 9.3 04/07/2019   PROT 6.1 (L) 04/07/2019   ALBUMIN 3.4 (L) 04/07/2019   AST 28 04/07/2019   ALT 54 (H) 04/07/2019   ALKPHOS 77 04/07/2019   BILITOT 0.6 04/07/2019   GFRNONAA >60 04/07/2019   GFRAA >60 04/07/2019    No results found for: CEA1  Lab Results  Component Value Date   INR 1.4 (H) 12/04/2018    Imaging:  No results found.  Medications: I have reviewed the patient's current  medications.   Assessment/Plan: 1. 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 Mild splenomegaly.  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, no evidence of 11q22deletion  Bone marrow biopsy 02/26/2018-hypercellular marrow with extensive involvement by CLL, lymphocytes represent85% of all cells  Ibrutinib initiated 04/03/2018  Ibrutinib placed on hold 04/11/2018 due to onset of arthralgias  Ibrutinib resumed 04/16/2018, discontinued 04/25/2018 secondary to severe arthralgias/arthritis  Ibrutinib resumed at a dose of 140 mg daily 05/03/2018  Ibrutinib dose adjusted to 140 mg alternating with 252m 06/25/2018  Ibrutinib discontinued 07/03/2018 secondary to severe arthralgias  Acalabrutinib 08/16/2018, discontinued 11/15/2018 secondary to persistent severe transfusion dependent anemia and neutropenia/thrombocytopenia, last dose 11/14/2018  Bone marrow biopsy 11/21/2018-decreased cellularity, involvement by CLL, decreased erythroid and granulocytic precursors, decreased megakaryocytes  Cycle 1 rituximab 12/06/2018  Bone marrow biopsy 12/24/2018 at  [facility]-hypocellular bone  marrow (10%) involved by CLL, representing 50% of marrow cellularity; markedly decreased trilineage hematopoiesis including essentially absent erythropoiesis  Bone marrow biopsy 03/06/2027 [facility]- hypocellular bone marrow (20%) with marked reduction of maturing hematopoietic elements; numerous lymphoid aggregates consistent with involvement by CLL, representing approximately 50% of marrow cellularity by flow cytometry  ATG/cyclosporine at [facility] beginning 03/19/2019 followed by prednisone taper  2.Hypothyroidism 3.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 Augmentin prescribed. 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 Pancytopenia secondary to CLL and a hypocellular bone marrow  G-CSF and Nplate started 9/32/3557, G-CSF changed to daily beginning 12/17/2018; G-CSF discontinued 12/31/2018, last Nplate 01/15/2019  Began prednisone 60 mg daily 01/11/19, tolerating moderately well except jitteriness, irritability, and difficulty sleeping. Tapered to 40 mg daily x1 week starting 01/24/19, reduce by 10 mg each week until discontinued. Prednisone discontinued 02/20/2019.  Promacta started 01/20/2019, dose increased to 100 mg daily 02/04/2019; dose increased to 150 mg daily 02/21/2019  IVIG daily for 2 days beginning 02/25/2019   9. Severe headache and nausea/vomiting 02/27/2019-likely related to IVIG therapy,improved    Disposition: [name] appears stable.  She is now at day 20 following the initiation of ATG/cyclosporine.  She is completing a prednisone taper, currently at 10 mg daily.  She will receive 1  unit of packed red cells today.  The abdominal discomfort may be related to polypharmacy, gastritis?[name]  The transaminitis over the past few weeks was likely related to ATG therapy and has improved.  She will continue the current antibiotic prophylaxis.  We will see her for an office visit weekly.  She will return for a CBC 04/09/2019.  I will communicate with the [facility] team regarding the timing follow-up there.  Meagan Coder, MD  04/07/2019  8:52 AM

## 2019-04-08 ENCOUNTER — Ambulatory Visit: Admit: 2019-04-08 | Discharge: 2019-04-09

## 2019-04-08 ENCOUNTER — Ambulatory Visit
Admit: 2019-04-08 | Discharge: 2019-04-09 | Payer: PRIVATE HEALTH INSURANCE | Attending: Adult Health | Primary: Adult Health

## 2019-04-08 ENCOUNTER — Other Ambulatory Visit: Payer: Self-pay | Admitting: *Deleted

## 2019-04-08 DIAGNOSIS — C9110 Chronic lymphocytic leukemia of B-cell type not having achieved remission: Secondary | ICD-10-CM

## 2019-04-08 DIAGNOSIS — Z23 Encounter for immunization: Secondary | ICD-10-CM

## 2019-04-08 DIAGNOSIS — D619 Aplastic anemia, unspecified: Secondary | ICD-10-CM

## 2019-04-08 DIAGNOSIS — K068 Other specified disorders of gingiva and edentulous alveolar ridge: Secondary | ICD-10-CM

## 2019-04-08 LAB — TYPE AND SCREEN
ABO/RH(D): O POS
Antibody Screen: NEGATIVE
Unit division: 0

## 2019-04-08 LAB — BPAM RBC
Blood Product Expiration Date: 202010112359
ISSUE DATE / TIME: 202009141113
Unit Type and Rh: 5100

## 2019-04-08 MED ORDER — AMINOCAPROIC ACID 250 MG/ML (25 %) ORAL SOLUTION
2 refills | 0 days | Status: CP
Start: 2019-04-08 — End: ?

## 2019-04-08 NOTE — Unmapped (Addendum)
[facility] Follow-up: virtual visit    I spent 25 minutes on the real-time audio and video with the patient. I spent an additional 10 minutes on pre- and post-visit activities.     The patient was physically located in [place] or a state in which I am permitted to provide care. The patient and/or parent/guardian understood that s/he may incur co-pays and cost sharing, and agreed to the telemedicine visit. The visit was reasonable and appropriate under the circumstances given the patient's presentation at the time.    The patient and/or parent/guardian has been advised of the potential risks and limitations of this mode of treatment (including, but not limited to, the absence of in-person examination) and has agreed to be treated using telemedicine. The patient's/patient's family's questions regarding telemedicine have been answered.     If the visit was completed in an ambulatory setting, the patient and/or parent/guardian has also been advised to contact their provider???s office for worsening conditions, and seek emergency medical treatment and/or call 911 if the patient deems either necessary.      Patient Name: Susan Kane  Patient Age: 58 y.o.  Encounter Date: 04/08/2019    Primary Care Provider:  Thora Lance, MD    Referring Physician:  Lillia Mountain, MD  [address]  [address]  [place],  [place] [address]    Chief complaint: Here for follow up of : CLL, transfusion dependent pancytopenia    Assessment:  Susan Kane is a 58 y.o. year old female with history of CLL initially treated with ibrutinib. Unfortunately she was not been able to tolerate this due to severe arthralgias. Therapy was changed to acalabrutinib, which was much better tolerated with no recurrence of arthralgias, but with progressive bone marrow failure as evidenced by ongoing transfusion dependence, worsening thrombocytopenia and neutropenia.   ??  Reviewed pt's case with Dr. [name] and also presented at our multidisciplinary hem malignancy conference on multiple occasions - while not typical, her course is concerning for a drug-induced toxic effect vs an aplastic anemia type picture. No MDS like mutations - the TP53 variant is likely a benign germline variant.    Acalabrutinib was discontinued and she was placed on Promacta as well as corticosteroids in addition to a x1 dose of Rituximab. This resulted in a brief but not durable response. Steroids have since been tapered off. Presently her ANC is 220, Hb 10.3, plt 20 after transfusion of 1 u PRBC, 1 u Platelets yesterday at OSH.    BMBx was performed on 8/14 which demonstrated  A hypocellular bone marrow (20%) with marked reduction of maturing hematopoietic elements. Numerous lymphoid aggregates, consistent with involvement by chronic lymphocytic leukemia, representing ~50% of marrow cellularity by flow cytometry. Cytogenetic studies are currently pending.    Proceeded with ATG-cyclosporine (8/26=Day 1) and she tolerated the ATG with just a mild transfusion reaction on day 1.    Currently on cyclosporine 175 mg BID and tapering prednisone and tolerating the therapy aside from GI discomfort.    Plan and Recommendations:   Aplastic Anemia  - cyclosporine 175 mg BID  - prednisone taper 10 mg daily through 9/20  -Continue anti-bacterial, anti-fungal (posa), anti-viral ppx  - Cyclosporine level monitoring, Labs/transfusion support with Dr. [name]    CLL  - hold CLL directed therapy  - RTC in 1 month    GI discomfort: Improving. May be secondary to cyclosporine and prednisone  - Continue Prilosec BID  - Continue  to maintain bowel regimen, ambulate, PRN simethicone    GIngival bleeding: stabilized with amicar  - start amicar solution   - Scheduled PO tabs of amicar for now  - gentle mouth care, salt water rinses      Dr. Lonni Fix was available    [name], AGPCNP-BC  Nurse Practitioner  Hematology/Oncology  [facility]    04/08/19    History of Present Illness:  Oncology History Overview Note   CLL:    Summary as per Dr. Kalman Drape most recent note with minor edits/additions from my review of records    1.??CLL?????diagnosed in August 2010, flow cytometry consistent with CLL  ?? Enlarged left??inguinal lymph node January 2019,??small??neck/axillary nodes and palpable splenomegaly 09/11/2017  ?? CTs??on 09/17/2017-3 cm necrotic appearing lymph node in the left inguinal region, borderline enlarged pelvic/retroperitoneal, chest, and axillary nodes. ??Mild splenomegaly.  ?? Ultrasound-guided biopsy of the left inguinal lymph node 09/18/2017, slightly purulent fluid aspirated, core biopsy is consistent with an atypical lymphoid proliferation???extensive necrosis with surrounding epithelioid histiocytes, limited intact lymphoid tissue involved with CLL  ?? Incisional biopsy of a necrotic/purulent left inguinal lymph node on 10/01/2017???extensive necrosis with granulomatous inflammation, small amount of viable lymphoid tissue involved with CLL, AFB and fungal stains negative  ?? Peripheral blood FISH analysis 02/05/2018??? +deletion 13q14, no evidence of p53 (17p13) deletion, no evidence of 11q22??deletion  - normal karyotype (46,XX)  ?? Bone marrow biopsy 02/26/2018???hypercellular marrow with extensive involvement by CLL, lymphocytes represent??85% of all cells  ?? Ibrutinib initiated 04/03/2018  ?? Ibrutinib placed on hold 04/11/2018 due to onset of arthralgias  ?? Ibrutinib resumed 04/16/2018, discontinued 04/25/2018 secondary to severe arthralgias/arthritis  ?? Ibrutinib resumed at a dose of 140 mg daily 05/03/2018  ?? Ibrutinib dose adjusted to 140 mg alternating with 280 mg 06/25/2018  ?? 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 ??Augmentin prescribed.  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    Baseline BM bx reviewed at [facility]  Final Diagnosis   (Outside Case #:  [id number], dated 02/26/2018)  Bone marrow, aspiration and biopsy  -  Hypercellular bone marrow (80%) with extensive involvement by chronic lymphocytic leukemia (87% lymphocytes by manual aspirate differential)  (See Comment)  -  By outside report, cytogenetic results are normal     Karyotype: 55, XX and FISH with 13q del but no 11q or 17p del    Repeat BM bx: (done after being off ibrutinib x1 month)  80% involvement by CLL    08/13/18: Presents to [facility] to discuss plan of care     Chronic lymphocytic leukemia (CLL), B-cell (CMS-HCC)   07/12/2018 Initial Diagnosis    Chronic lymphocytic leukemia (CLL), B-cell (CMS-HCC)     08/26/2018 -  Chemotherapy    Acalabrutinib started (approximate date)     11/14/2018 -  Chemotherapy    Acalabrutinib discontinued due to progressive bone marrow failure     12/06/2018 -  Chemotherapy    Ritux x1 given due to CLL still making up majority of BM cellularity (though in the setting of near aplasia of rest of TLH)         Interval History:    Since her discharge from the hospital, patient reports feeling slowly better. Initially she was  quite nauseous, all morning. This has finally resolved. She also has experienced reflux symptom and lower mid-abdominal discomfort. She was evaluated in infusion by the NP and reports the symptoms have been improving with spreading out her pills, BID Prilosec, PRN Simethicone, and using senna for constipation.  The symptoms have not fully resolved.    Her gums have not bled recently, but are still tender. She is taking Amicar 2 tablets/day but is interested in amicar solution if possible. She reports this was being investigated but she has not heard if it is available.      Otherwise, she denies new constitutional symptoms such as anorexia, weight loss, night sweats or unexplained fevers.  Furthermore, she denies recurrent or unexplained intercurrent infections, dyspnea on exertion, lightheadedness, palpitations or chest pain.  There have been no new or unexplained pains or self-identified masses, swelling or enlarged lymph nodes.    Past Medical, Surgical and Family History were reviewed and pertinent updates were made in the Electronic Medical Record    Review of Systems:  Other than as reported above in the interim history, the balance of a full 12-system review was performed and unremarkable.    ECOG Performance Status: 1    Past Medical History:  Past Medical History:   Diagnosis Date   ??? Anxiety    ??? Depression    ??? Hypothyroid        Medications:    Current Outpatient Medications   Medication Sig Dispense Refill   ??? prochlorperazine (COMPAZINE) 5 MG tablet Take 5 mg by mouth.     ??? ALPRAZolam (XANAX) 0.25 MG tablet daily as needed.      ??? aminocaproic acid (AMICAR) 500 mg tablet Take 1 g by mouth two (2) times a day. 2 tablets every 8 hours (1000mg )     ??? calcium carbonate (TUMS) 200 mg calcium (500 mg) chewable tablet Chew 1 tablet (200 mg elem calcium total) Three (3) times a day as needed for heartburn.  0   ??? cycloSPORINE (NEORAL) 100 mg/mL microemulsion solution Take 1 mL (100 mg total) by mouth Two (2) times a day. Take in addition to 75 mg (3 x 25 mg capsules) BID for a total of 175 mg BID 100 mL 0   ??? cycloSPORINE modified (NEORAL) 25 MG capsule Take 3 capsules (75 mg total) by mouth Two (2) times a day. Take in addition to 100 mg BID with the solution for a total of 175 mg BID 180 capsule 2   ??? folic acid (FOLVITE) 1 MG tablet Take 1 mg by mouth.     ??? levoFLOXacin (LEVAQUIN) 500 MG tablet Take 500 mg by mouth daily.     ??? levonorgestrel (MIRENA) 20 mcg/24 hours (5 yrs) 52 mg IUD 1 each by Intrauterine route.     ??? levothyroxine (SYNTHROID) 100 MCG tablet Take 100 mcg by mouth.     ??? lithium (ESKALITH CR) 450 MG ER tablet Take 900 mg by mouth.     ??? oxyCODONE-acetaminophen (PERCOCET) 5-325 mg per tablet Take 1 tablet by mouth every eight (8) hours as needed.     ??? posaconazole (NOXAFIL) 100 mg TbEC delayed released tablet Take  3 tablets (300 mg) by mouth daily. 90 tablet 2   ??? predniSONE (DELTASONE) 20 MG tablet Take 4 tablets (80 mg total) by mouth daily for 3 days, THEN 3 tablets (60 mg total) daily for 3 days, THEN 2 tablets (40 mg total) daily for 3 days, THEN 1 tablet (  20 mg total) daily for 7 days, THEN 0.5 tablets (10 mg total) daily for 7 days. 38 tablet 0   ??? sulfamethoxazole-trimethoprim (BACTRIM DS) 800-160 mg per tablet Take 1 tablet (160 mg of trimethoprim total) by mouth 2 times a day on Saturday, Sunday. 16 tablet 0   ??? SUMAtriptan (IMITREX) 20 mg/actuation nasal spray USE 1 SPRAY IN THE NOSE DAILY AS NEEDED FOR MIGRAINE     ??? TRINTELLIX 20 mg tablet Take 20 mg by mouth daily.      ??? valACYclovir (VALTREX) 500 MG tablet Take 500 mg by mouth daily.     ??? zolpidem (AMBIEN) 10 mg tablet        No current facility-administered medications for this visit.        Vital Signs:  BSA: There is no height or weight on file to calculate BSA.  There were no vitals filed for this visit.   VIDEO VISIT      Physical Exam:  Objective:   General: The patient appears in no acute distress  Psychiatric: Alert and oriented to person, place, time and situation.  Active participant in clinic discussion.  Skin:  No grossly-evident petechiae or purpura  Neurologic:  there is no grossly evident ataxia  Respiratory: The patient is speaking in full sentences without use of accessory muscles.  The remainder of the physical exam is deferred, as over half of the visit was spent discussing medical decision-making or coordination of care      Relevant Laboratory, radiology and pathology results:  Results for orders placed or performed during the hospital encounter of 04/03/19   CBC w/ Differential   Result Value Ref Range    WBC 9.3 4.5 - 11.0 10*9/L    RBC 3.75 (L) 4.00 - 5.20 10*12/L    HGB 10.8 (L) 12.0 - 16.0 g/dL    HCT 29.5 (L) 62.1 - 46.0 %    MCV 85.2 80.0 - 100.0 fL    MCH 28.7 26.0 - 34.0 pg    MCHC 33.7 31.0 - 37.0 g/dL    RDW 30.8 65.7 - 84.6 %    MPV 7.2 7.0 - 10.0 fL    Platelet 18 (L) 150 - 440 10*9/L    Neutrophils % 2.6 %    Lymphocytes % 95.0 %    Monocytes % 0.1 %    Eosinophils % 0.3 %    Basophils % 0.3 %    Absolute Neutrophils 0.2 (LL) 2.0 - 7.5 10*9/L    Absolute Lymphocytes 8.8 (H) 1.5 - 5.0 10*9/L    Absolute Monocytes 0.0 (L) 0.2 - 0.8 10*9/L    Absolute Eosinophils 0.0 0.0 - 0.4 10*9/L    Absolute Basophils 0.0 0.0 - 0.1 10*9/L    Large Unstained Cells 2 0 - 4 %   Morphology Review   Result Value Ref Range    Smear Review Comments See Comment (A) Undefined

## 2019-04-08 NOTE — Unmapped (Signed)
Hi,     Patient contacted the Communication Center requesting to speak with the care team of Susan Kane to discuss:    Stated that Dr. [name] her oncologist [facility] was in contact with Dr. Lonni Fix yesterday about patient's appointment for 04/08/2019 being converted to a video visit/phone visit. Patient was under the assumption that she did not need to visit the clinic today, patient's chart is showing visit to be scheduled in clinic.     No previous phone encounters on patient's chart to show that a change needed to be made to her appointments     Please contact Evaleen at [phone].    Check Indicates criteria has been reviewed and confirmed with the patient:    [x]  Preferred Name   [x]  DOB and/or MR#  [x]  Preferred Contact Method  [x]  Phone Number(s)   []  MyChart     Thank you,   Jannette Spanner  [facility]   [phone]

## 2019-04-08 NOTE — Progress Notes (Signed)
Per Dr. Benay Spice: OK for flu vaccine after consultation w/Dr. Prince Solian and will need cyclosporin level drawn on 04/11/19. Orders entered.

## 2019-04-09 ENCOUNTER — Inpatient Hospital Stay: Payer: BC Managed Care – PPO

## 2019-04-09 ENCOUNTER — Other Ambulatory Visit: Payer: Self-pay | Admitting: *Deleted

## 2019-04-09 ENCOUNTER — Other Ambulatory Visit: Payer: Self-pay

## 2019-04-09 ENCOUNTER — Other Ambulatory Visit: Payer: Self-pay | Admitting: Oncology

## 2019-04-09 DIAGNOSIS — D61818 Other pancytopenia: Secondary | ICD-10-CM | POA: Diagnosis not present

## 2019-04-09 DIAGNOSIS — R109 Unspecified abdominal pain: Secondary | ICD-10-CM | POA: Diagnosis not present

## 2019-04-09 DIAGNOSIS — K068 Other specified disorders of gingiva and edentulous alveolar ridge: Secondary | ICD-10-CM | POA: Diagnosis not present

## 2019-04-09 DIAGNOSIS — C9110 Chronic lymphocytic leukemia of B-cell type not having achieved remission: Secondary | ICD-10-CM

## 2019-04-09 DIAGNOSIS — D696 Thrombocytopenia, unspecified: Secondary | ICD-10-CM

## 2019-04-09 DIAGNOSIS — Z79899 Other long term (current) drug therapy: Secondary | ICD-10-CM | POA: Diagnosis not present

## 2019-04-09 DIAGNOSIS — D801 Nonfamilial hypogammaglobulinemia: Secondary | ICD-10-CM | POA: Diagnosis not present

## 2019-04-09 DIAGNOSIS — R5381 Other malaise: Secondary | ICD-10-CM | POA: Diagnosis not present

## 2019-04-09 DIAGNOSIS — R233 Spontaneous ecchymoses: Secondary | ICD-10-CM | POA: Diagnosis not present

## 2019-04-09 DIAGNOSIS — E039 Hypothyroidism, unspecified: Secondary | ICD-10-CM | POA: Diagnosis not present

## 2019-04-09 DIAGNOSIS — Z95828 Presence of other vascular implants and grafts: Secondary | ICD-10-CM

## 2019-04-09 DIAGNOSIS — Z23 Encounter for immunization: Secondary | ICD-10-CM | POA: Diagnosis not present

## 2019-04-09 LAB — TYPE AND SCREEN
ABO/RH(D): O POS
Antibody Screen: NEGATIVE

## 2019-04-09 LAB — CBC WITH DIFFERENTIAL (CANCER CENTER ONLY)
Abs Immature Granulocytes: 0.01 10*3/uL (ref 0.00–0.07)
Basophils Absolute: 0 10*3/uL (ref 0.0–0.1)
Basophils Relative: 0 %
Eosinophils Absolute: 0 10*3/uL (ref 0.0–0.5)
Eosinophils Relative: 0 %
HCT: 27.7 % — ABNORMAL LOW (ref 36.0–46.0)
Hemoglobin: 9.5 g/dL — ABNORMAL LOW (ref 12.0–15.0)
Immature Granulocytes: 0 %
Lymphocytes Relative: 97 %
Lymphs Abs: 6 10*3/uL — ABNORMAL HIGH (ref 0.7–4.0)
MCH: 29.1 pg (ref 26.0–34.0)
MCHC: 34.3 g/dL (ref 30.0–36.0)
MCV: 84.7 fL (ref 80.0–100.0)
Monocytes Absolute: 0 10*3/uL — ABNORMAL LOW (ref 0.1–1.0)
Monocytes Relative: 1 %
Neutro Abs: 0.1 10*3/uL — CL (ref 1.7–7.7)
Neutrophils Relative %: 2 %
Platelet Count: 7 10*3/uL — CL (ref 150–400)
RBC: 3.27 MIL/uL — ABNORMAL LOW (ref 3.87–5.11)
RDW: 13.1 % (ref 11.5–15.5)
WBC Count: 6.1 10*3/uL (ref 4.0–10.5)
nRBC: 0 % (ref 0.0–0.2)

## 2019-04-09 LAB — PLATELET COUNT (CANCER CENTER ONLY): Platelet Count: 20 10*3/uL — ABNORMAL LOW (ref 150–400)

## 2019-04-09 MED ORDER — SODIUM CHLORIDE 0.9% IV SOLUTION
250.0000 mL | Freq: Once | INTRAVENOUS | Status: AC
  Administered 2019-04-09: 250 mL via INTRAVENOUS
  Filled 2019-04-09: qty 250

## 2019-04-09 MED ORDER — CYCLOSPORINE MODIFIED (NEORAL) 25 MG PO CAPS: 25.0000 mg | ORAL_CAPSULE | Freq: Two times a day (BID) | ORAL | 0 refills | Status: DC

## 2019-04-09 MED ORDER — HEPARIN SOD (PORK) LOCK FLUSH 100 UNIT/ML IV SOLN
250.0000 [IU] | INTRAVENOUS | Status: AC | PRN
  Administered 2019-04-09: 250 [IU]
  Filled 2019-04-09: qty 5

## 2019-04-09 MED ORDER — SODIUM CHLORIDE 0.9% FLUSH
10.0000 mL | INTRAVENOUS | Status: DC | PRN
  Administered 2019-04-09: 10 mL
  Filled 2019-04-09: qty 10

## 2019-04-09 MED ORDER — SODIUM CHLORIDE 0.9% FLUSH
10.0000 mL | INTRAVENOUS | Status: AC | PRN
  Administered 2019-04-09: 10 mL
  Filled 2019-04-09: qty 10

## 2019-04-09 NOTE — Unmapped (Signed)
Continue the interventions you have started for your belly discomfort. Walk regularly.    Use gentle oral care, including salt water rinses.    Complete the last days of prednisone.    Continue to cyclosporine as directed.    I am investigating if amicar is available to you as a solution.    Continue lab checks with Dr. [name].    We will see you again in 1 month.

## 2019-04-09 NOTE — Unmapped (Signed)
Addended by: [name] T on: 04/08/2019 05:56 PM     Modules accepted: Orders

## 2019-04-09 NOTE — Progress Notes (Signed)
Dr. Benay Spice wants her to receive crossmatched platelets on 04/11/19. Called blood bank and confirmed orders are received. Patient reported her gums were bleeding yesterday, so she increased her Amicar to 100 mg daily. No further bleeding today. She is waiting for [facility] to get her amicar rinse prepared.

## 2019-04-10 LAB — PREPARE PLATELET PHERESIS: Unit division: 0

## 2019-04-10 LAB — BPAM PLATELET PHERESIS
Blood Product Expiration Date: 202009172359
ISSUE DATE / TIME: 202009160918
Unit Type and Rh: 5100

## 2019-04-10 NOTE — Unmapped (Signed)
I spoke with patient Susan Kane to confirm appointments on the following date(s): appt scheduled with [name], NP on 10/13 as virtual visit    Samella Parr

## 2019-04-11 ENCOUNTER — Inpatient Hospital Stay: Payer: BC Managed Care – PPO

## 2019-04-11 ENCOUNTER — Telehealth: Payer: Self-pay | Admitting: *Deleted

## 2019-04-11 ENCOUNTER — Other Ambulatory Visit: Payer: Self-pay | Admitting: *Deleted

## 2019-04-11 ENCOUNTER — Other Ambulatory Visit: Payer: Self-pay

## 2019-04-11 VITALS — BP 124/63 | HR 86 | Temp 98.5°F | Resp 18

## 2019-04-11 DIAGNOSIS — R109 Unspecified abdominal pain: Secondary | ICD-10-CM | POA: Diagnosis not present

## 2019-04-11 DIAGNOSIS — C9110 Chronic lymphocytic leukemia of B-cell type not having achieved remission: Secondary | ICD-10-CM

## 2019-04-11 DIAGNOSIS — K068 Other specified disorders of gingiva and edentulous alveolar ridge: Secondary | ICD-10-CM | POA: Diagnosis not present

## 2019-04-11 DIAGNOSIS — D696 Thrombocytopenia, unspecified: Secondary | ICD-10-CM

## 2019-04-11 DIAGNOSIS — Z79899 Other long term (current) drug therapy: Secondary | ICD-10-CM | POA: Diagnosis not present

## 2019-04-11 DIAGNOSIS — Z23 Encounter for immunization: Secondary | ICD-10-CM | POA: Diagnosis not present

## 2019-04-11 DIAGNOSIS — R5381 Other malaise: Secondary | ICD-10-CM | POA: Diagnosis not present

## 2019-04-11 DIAGNOSIS — E039 Hypothyroidism, unspecified: Secondary | ICD-10-CM | POA: Diagnosis not present

## 2019-04-11 DIAGNOSIS — R233 Spontaneous ecchymoses: Secondary | ICD-10-CM | POA: Diagnosis not present

## 2019-04-11 DIAGNOSIS — Z95828 Presence of other vascular implants and grafts: Secondary | ICD-10-CM

## 2019-04-11 DIAGNOSIS — D61818 Other pancytopenia: Secondary | ICD-10-CM | POA: Diagnosis not present

## 2019-04-11 DIAGNOSIS — D801 Nonfamilial hypogammaglobulinemia: Secondary | ICD-10-CM | POA: Diagnosis not present

## 2019-04-11 LAB — CBC WITH DIFFERENTIAL (CANCER CENTER ONLY)
Abs Immature Granulocytes: 0.01 10*3/uL (ref 0.00–0.07)
Basophils Absolute: 0 10*3/uL (ref 0.0–0.1)
Basophils Relative: 0 %
Eosinophils Absolute: 0 10*3/uL (ref 0.0–0.5)
Eosinophils Relative: 0 %
HCT: 26 % — ABNORMAL LOW (ref 36.0–46.0)
Hemoglobin: 9 g/dL — ABNORMAL LOW (ref 12.0–15.0)
Immature Granulocytes: 0 %
Lymphocytes Relative: 95 %
Lymphs Abs: 4.6 10*3/uL — ABNORMAL HIGH (ref 0.7–4.0)
MCH: 29.2 pg (ref 26.0–34.0)
MCHC: 34.6 g/dL (ref 30.0–36.0)
MCV: 84.4 fL (ref 80.0–100.0)
Monocytes Absolute: 0 10*3/uL — ABNORMAL LOW (ref 0.1–1.0)
Monocytes Relative: 1 %
Neutro Abs: 0.2 10*3/uL — CL (ref 1.7–7.7)
Neutrophils Relative %: 4 %
Platelet Count: 17 10*3/uL — ABNORMAL LOW (ref 150–400)
RBC: 3.08 MIL/uL — ABNORMAL LOW (ref 3.87–5.11)
RDW: 13.1 % (ref 11.5–15.5)
WBC Count: 4.8 10*3/uL (ref 4.0–10.5)
nRBC: 0 % (ref 0.0–0.2)

## 2019-04-11 LAB — TYPE AND SCREEN
ABO/RH(D): O POS
Antibody Screen: NEGATIVE

## 2019-04-11 MED ORDER — CYCLOSPORINE MODIFIED (NEORAL) 100 MG/ML PO SOLN: 175.0000 mg | Freq: Two times a day (BID) | ORAL | 0 refills | Status: DC

## 2019-04-11 MED ORDER — SODIUM CHLORIDE 0.9% IV SOLUTION
250.0000 mL | Freq: Once | INTRAVENOUS | Status: DC
  Filled 2019-04-11: qty 250

## 2019-04-11 MED ORDER — SODIUM CHLORIDE 0.9% FLUSH
10.0000 mL | INTRAVENOUS | Status: DC | PRN
  Administered 2019-04-11: 10 mL
  Filled 2019-04-11: qty 10

## 2019-04-11 MED ORDER — HEPARIN SOD (PORK) LOCK FLUSH 100 UNIT/ML IV SOLN
500.0000 [IU] | Freq: Once | INTRAVENOUS | Status: AC | PRN
  Administered 2019-04-11: 14:00:00 500 [IU]
  Filled 2019-04-11: qty 5

## 2019-04-11 MED FILL — CYCLOSPORINE 100 MG/ML SOLN: 100 | 28 days supply | Qty: 100 | Fill #0

## 2019-04-11 NOTE — Telephone Encounter (Signed)
"  [facility] Pharmacist for Dr. Gearldine Shown nurse.  A Cyclosporine order was filled 03-25-2019.  Too early to fill per insurance and patient says she only takes liquid.  Secure Chat message sent earlier."  Call ended.

## 2019-04-11 NOTE — Progress Notes (Signed)
Notified blood bank of crossmatch platelet order for Monday. They have sample to send to [place]. Call from [facility] w/Dr. Coombs requesting most recent cyclosporin level [phone]). Called back and provided result from 03/31/2019 and informed him it was redrawn today and should result on Monday. Dr. Benay Spice called in and was notified of CBC results.

## 2019-04-11 NOTE — Patient Instructions (Signed)

## 2019-04-11 NOTE — Progress Notes (Signed)
[facility] calling: patient wants to take all liquid for cyclosporin instead of liquid and 25 mg capsules. New script sent as requested.

## 2019-04-11 NOTE — Patient Instructions (Signed)
PICC Home Care Guide ° °A peripherally inserted central catheter (PICC) is a form of IV access that allows medicines and IV fluids to be quickly distributed throughout the body. The PICC is a long, thin, flexible tube (catheter) that is inserted into a vein in the upper arm. The catheter ends in a large vein in the chest (superior vena cava, or SVC). After the PICC is inserted, a chest X-ray may be done to make sure that it is in the correct place. °A PICC may be placed for different reasons, such as: °· To give medicines and liquid nutrition. °· To give IV fluids and blood products. °· If there is trouble placing a peripheral intravenous (PIV) catheter. °If taken care of properly, a PICC can remain in place for several months. Having a PICC can also allow a person to go home from the hospital sooner. Medicine and PICC care can be managed at home by a family member, caregiver, or home health care team. °What are the risks? °Generally, having a PICC is safe. However, problems may occur, including: °· A blood clot (thrombus) forming in or at the tip of the PICC. °· A blood clot forming in a vein (deep vein thrombosis) or traveling to the lung (pulmonary embolism). °· Inflammation of the vein (phlebitis) in which the PICC is placed. °· Infection. Central line associated blood stream infection (CLABSI) is a serious infection that often requires hospitalization. °· PICC movement (malposition). The PICC tip may move from its original position due to excessive physical activity, forceful coughing, sneezing, or vomiting. °· A break or cut in the PICC. It is important not to use scissors near the PICC. °· Nerve or tendon irritation or injury during PICC insertion. °How to take care of your PICC °Preventing problems °· You and any caregivers should wash your hands often with soap. Wash hands: °? Before touching the PICC line or the infusion device. °? Before changing a bandage (dressing). °· Flush the PICC as told by your  health care provider. Let your health care provider know right away if the PICC is hard to flush or does not flush. Do not use force to flush the PICC. °· Do not use a syringe that is less than 10 mL to flush the PICC. °· Avoid blood pressure checks on the arm in which the PICC is placed. °· Never pull or tug on the PICC. °· Do not take the PICC out yourself. Only a trained clinical professional should remove the PICC. °· Use clean and sterile supplies only. Keep the supplies in a dry place. Do not reuse needles, syringes, or any other supplies. Doing that can lead to infection. °· Keep pets and children away from your PICC line. °· Check the PICC insertion site every day for signs of infection. Check for: °? Leakage. °? Redness, swelling, or pain. °? Fluid or blood. °? Warmth. °? Pus or a bad smell. °PICC dressing care °· Keep your PICC bandage (dressing) clean and dry to prevent infection. °· Do not take baths, swim, or use a hot tub until your health care provider approves. Ask your health care provider if you can take showers. You may only be allowed to take sponge baths for bathing. When you are allowed to shower: °? Ask your health care provider to teach you how to wrap the PICC line. °? Cover the PICC line with clear plastic wrap and tape to keep it dry while showering. °· Follow instructions from your health care provider   about how to take care of your insertion site and dressing. Make sure you: °? Wash your hands with soap and water before you change your bandage (dressing). If soap and water are not available, use hand sanitizer. °? Change your dressing as told by your health care provider. °? Leave stitches (sutures), skin glue, or adhesive strips in place. These skin closures may need to stay in place for 2 weeks or longer. If adhesive strip edges start to loosen and curl up, you may trim the loose edges. Do not remove adhesive strips completely unless your health care provider tells you to do  that. °· Change your PICC dressing if it becomes loose or wet. °General instructions ° °· Carry your PICC identification card or wear a medical alert bracelet at all times. °· Keep the tube clamped at all times, unless it is being used. °· Carry a smooth-edge clamp with you at all times to place on the tube if it breaks. °· Do not use scissors or sharp objects near the tube. °· You may bend your arm and move it freely. If your PICC is near or at the bend of your elbow, avoid activity with repeated motion at the elbow. °· Avoid lifting heavy objects as told by your health care provider. °· Keep all follow-up visits as told by your health care provider. This is important. °Disposal of supplies °· Throw away any syringes in a disposal container that is meant for sharp items (sharps container). You can buy a sharps container from a pharmacy, or you can make one by using an empty hard plastic bottle with a cover. °· Place any used dressings or infusion bags into a plastic bag. Throw that bag in the trash. °Contact a health care provider if: °· You have pain in your arm, ear, face, or teeth. °· You have a fever or chills. °· You have redness, swelling, or pain around the insertion site. °· You have fluid or blood coming from the insertion site. °· Your insertion site feels warm to the touch. °· You have pus or a bad smell coming from the insertion site. °· Your skin feels hard and raised around the insertion site. °Get help right away if: °· Your PICC is accidentally pulled all the way out. If this happens, cover the insertion site with a bandage or gauze dressing. Do not throw the PICC away. Your health care provider will need to check it. °· Your PICC was tugged or pulled and has partially come out. Do not  push the PICC back in. °· You cannot flush the PICC, it is hard to flush, or the PICC leaks around the insertion site when it is flushed. °· You hear a "flushing" sound when the PICC is flushed. °· You feel your  heart racing or skipping beats. °· There is a hole or tear in the PICC. °· You have swelling in the arm in which the PICC was inserted. °· You have a red streak going up your arm from where the PICC was inserted. °Summary °· A peripherally inserted central catheter (PICC) is a long, thin, flexible tube (catheter) that is inserted into a vein in the upper arm. °· The PICC is inserted using a sterile technique by a specially trained nurse or physician. Only a trained clinical professional should remove it. °· Keep your PICC identification card with you at all times. °· Avoid blood pressure checks on the arm in which the PICC is placed. °· If cared for   properly, a PICC can remain in place for several months. Having a PICC can also allow a person to go home from the hospital sooner. °This information is not intended to replace advice given to you by your health care provider. Make sure you discuss any questions you have with your health care provider. °Document Released: 01/14/2003 Document Revised: 06/22/2017 Document Reviewed: 08/12/2016 °Elsevier Patient Education © 2020 Elsevier Inc. ° °

## 2019-04-11 NOTE — Telephone Encounter (Signed)
CRITICAL VALUE STICKER  CRITICAL VALUE: ANC = 0.2  RECEIVER (on-site recipient of call): Cherylynn Ridges RN, Triage [place] NOTIFIED: 04/11/2019 at 1219.   MESSENGER (representative from lab): Marsha MT Myles Gip  MD NOTIFIED: Collaborative for Dr. Benay Spice.  TIME OF NOTIFICATION: 04/11/2019 at 1220.  RESPONSE: None.  Collaborative already aware.

## 2019-04-11 NOTE — Progress Notes (Signed)
Per Dr. Benay Spice: Transfuse platelets today if count is < 20 and blood if Hgb is < 8.5.

## 2019-04-12 LAB — BPAM PLATELET PHERESIS
Blood Product Expiration Date: 202009182359
ISSUE DATE / TIME: 202009181215
Unit Type and Rh: 5100

## 2019-04-12 LAB — PREPARE PLATELET PHERESIS: Unit division: 0

## 2019-04-13 LAB — CYCLOSPORINE: Cyclosporine, LabCorp: 240 ng/mL (ref 100–400)

## 2019-04-14 ENCOUNTER — Inpatient Hospital Stay: Payer: BC Managed Care – PPO

## 2019-04-14 ENCOUNTER — Telehealth: Payer: Self-pay | Admitting: *Deleted

## 2019-04-14 ENCOUNTER — Other Ambulatory Visit: Payer: Self-pay

## 2019-04-14 ENCOUNTER — Other Ambulatory Visit: Payer: Self-pay | Admitting: *Deleted

## 2019-04-14 ENCOUNTER — Inpatient Hospital Stay (HOSPITAL_BASED_OUTPATIENT_CLINIC_OR_DEPARTMENT_OTHER): Payer: BC Managed Care – PPO | Admitting: Oncology

## 2019-04-14 VITALS — BP 110/81 | HR 96 | Temp 98.7°F | Resp 17 | Ht 66.0 in | Wt 139.6 lb

## 2019-04-14 DIAGNOSIS — D649 Anemia, unspecified: Secondary | ICD-10-CM

## 2019-04-14 DIAGNOSIS — D696 Thrombocytopenia, unspecified: Secondary | ICD-10-CM

## 2019-04-14 DIAGNOSIS — E039 Hypothyroidism, unspecified: Secondary | ICD-10-CM | POA: Diagnosis not present

## 2019-04-14 DIAGNOSIS — R5381 Other malaise: Secondary | ICD-10-CM | POA: Diagnosis not present

## 2019-04-14 DIAGNOSIS — C9110 Chronic lymphocytic leukemia of B-cell type not having achieved remission: Secondary | ICD-10-CM | POA: Diagnosis not present

## 2019-04-14 DIAGNOSIS — D61818 Other pancytopenia: Secondary | ICD-10-CM | POA: Diagnosis not present

## 2019-04-14 DIAGNOSIS — Z79899 Other long term (current) drug therapy: Secondary | ICD-10-CM | POA: Diagnosis not present

## 2019-04-14 DIAGNOSIS — Z95828 Presence of other vascular implants and grafts: Secondary | ICD-10-CM

## 2019-04-14 DIAGNOSIS — R109 Unspecified abdominal pain: Secondary | ICD-10-CM | POA: Diagnosis not present

## 2019-04-14 DIAGNOSIS — D801 Nonfamilial hypogammaglobulinemia: Secondary | ICD-10-CM | POA: Diagnosis not present

## 2019-04-14 DIAGNOSIS — R233 Spontaneous ecchymoses: Secondary | ICD-10-CM | POA: Diagnosis not present

## 2019-04-14 DIAGNOSIS — Z23 Encounter for immunization: Secondary | ICD-10-CM | POA: Diagnosis not present

## 2019-04-14 DIAGNOSIS — K068 Other specified disorders of gingiva and edentulous alveolar ridge: Secondary | ICD-10-CM | POA: Diagnosis not present

## 2019-04-14 LAB — CBC WITH DIFFERENTIAL (CANCER CENTER ONLY)
Abs Immature Granulocytes: 0.03 10*3/uL (ref 0.00–0.07)
Basophils Absolute: 0 10*3/uL (ref 0.0–0.1)
Basophils Relative: 0 %
Eosinophils Absolute: 0 10*3/uL (ref 0.0–0.5)
Eosinophils Relative: 0 %
HCT: 24.4 % — ABNORMAL LOW (ref 36.0–46.0)
Hemoglobin: 8.4 g/dL — ABNORMAL LOW (ref 12.0–15.0)
Immature Granulocytes: 1 %
Lymphocytes Relative: 91 %
Lymphs Abs: 4.4 10*3/uL — ABNORMAL HIGH (ref 0.7–4.0)
MCH: 28.9 pg (ref 26.0–34.0)
MCHC: 34.4 g/dL (ref 30.0–36.0)
MCV: 83.8 fL (ref 80.0–100.0)
Monocytes Absolute: 0.1 10*3/uL (ref 0.1–1.0)
Monocytes Relative: 2 %
Neutro Abs: 0.3 10*3/uL — CL (ref 1.7–7.7)
Neutrophils Relative %: 6 %
Platelet Count: 16 10*3/uL — ABNORMAL LOW (ref 150–400)
RBC: 2.91 MIL/uL — ABNORMAL LOW (ref 3.87–5.11)
RDW: 12.8 % (ref 11.5–15.5)
WBC Count: 4.1 10*3/uL (ref 4.0–10.5)
nRBC: 0 % (ref 0.0–0.2)

## 2019-04-14 LAB — PREPARE RBC (CROSSMATCH)

## 2019-04-14 LAB — SAMPLE TO BLOOD BANK

## 2019-04-14 MED ORDER — SODIUM CHLORIDE 0.9% FLUSH
10.0000 mL | INTRAVENOUS | Status: DC | PRN
  Filled 2019-04-14: qty 10

## 2019-04-14 NOTE — Unmapped (Signed)
I called and spoke with Susan Kane at [facility] regarding Susan Kane's cyclosporine level, and also received the results via fax.    Susan Kane is currently on cyclosporine 175 mg BID. She takes 100 mg BID using the modified suspension, and takes 3 x 25 mg tablets (75 mg) BID to make the total of 175 mg BID. She was recently increased form 150 mg BID to 175 mg BID on 9/10. She got the level drawn last Friday 9/18 locally, and it resulted as 240 ng/mL. Her goal range is 200-400.    Therefore, I recommend she continue cyclosporine 175 mg BID. She will get another level drawn this upcoming Friday 9/25 locally and will follow up on results the following Monday.      Manfred Arch, PharmD, BCOP, CPP  Pager: [phone]

## 2019-04-14 NOTE — Unmapped (Signed)
This patient has been disenrolled from the [facility] specialty pharmacy services due to a pharmacy change. The patient is now filling at local pharmacy.Nanetta Batty  [facility] Specialty Pharmacist

## 2019-04-14 NOTE — Progress Notes (Signed)
Notified blood bank that she will not need the platelets today. May release them. Will plan on 1 unit of blood on 04/16/19. Orders in and verified with blood bank.

## 2019-04-14 NOTE — Progress Notes (Signed)
[place] OFFICE PROGRESS NOTE   Diagnosis: CLL, aplastic anemia  INTERVAL HISTORY:   Meagan Walsh returns as scheduled.  She continues antibiotic prophylaxis and Amicar.  She has completed the prednisone taper.  Gum bleeding over the weekend, none at present.  No other bleeding.  No fever.  She reports malaise.  The abdominal discomfort has improved.  She does not feel in need of a red cell transfusion today.  She plans to begin oral Amicar rinse today.  Headaches have resolved.  She was last transfused with platelets on 04/11/2019.  Objective:  Vital signs in last 24 hours:  Blood pressure 110/81, pulse 96, temperature 98.7 F (37.1 C), temperature source Temporal, resp. rate 17, height _0  (1.676 m), weight 139 lb 9.6 oz (63.3 kg), SpO2 100 %.    HEENT: Few petechiae at the left buccal mucosa, no thrush, no other mouth bleedin GI: Soft and nontender, no hepatosplenomegaly Vascular: No leg edema  Skin: Resolving ecchymoses over the legs, no petechiae  Portacath/PICC-without erythema  Lab Results:  Lab Results  Component Value Date   WBC 4.1 04/14/2019   HGB 8.4 (L) 04/14/2019   HCT 24.4 (L) 04/14/2019   MCV 83.8 04/14/2019   PLT 16 (L) 04/14/2019   NEUTROABS 0.3 (LL) 04/14/2019    CMP  Lab Results  Component Value Date   NA 137 04/07/2019   K 4.2 04/07/2019   CL 107 04/07/2019   CO2 22 04/07/2019   GLUCOSE 147 (H) 04/07/2019   BUN 18 04/07/2019   CREATININE 0.90 04/07/2019   CALCIUM 9.3 04/07/2019   PROT 6.1 (L) 04/07/2019   ALBUMIN 3.4 (L) 04/07/2019   AST 28 04/07/2019   ALT 54 (H) 04/07/2019   ALKPHOS 77 04/07/2019   BILITOT 0.6 04/07/2019   GFRNONAA >60 04/07/2019   GFRAA >60 04/07/2019     Medications: I have reviewed the patient's current medications.   Assessment/Plan:  1. 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 Mild splenomegaly.  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, no evidence of 11q22deletion  Bone marrow biopsy 02/26/2018-hypercellular marrow with extensive involvement by CLL, lymphocytes represent85% of all cells  Ibrutinib initiated 04/03/2018  Ibrutinib placed on hold 04/11/2018 due to onset of arthralgias  Ibrutinib resumed 04/16/2018, discontinued 04/25/2018 secondary to severe arthralgias/arthritis  Ibrutinib resumed at a dose of 140 mg daily 05/03/2018  Ibrutinib dose adjusted to 140 mg alternating with 266m 06/25/2018  Ibrutinib discontinued 07/03/2018 secondary to severe arthralgias  Acalabrutinib 08/16/2018, discontinued 11/15/2018 secondary to persistent severe transfusion dependent anemia and neutropenia/thrombocytopenia, last dose 11/14/2018  Bone marrow biopsy 11/21/2018-decreased cellularity, involvement by CLL, decreased erythroid and granulocytic precursors, decreased megakaryocytes  Cycle 1 rituximab 12/06/2018  Bone marrow biopsy 12/24/2018 at [facility]-hypocellular bone marrow (10%) involved by CLL, representing 50% of marrow cellularity; markedly decreased trilineage hematopoiesis including essentially absent erythropoiesis  Bone marrow biopsy 03/06/2027 [facility]  hypocellular bone marrow (20%) with marked reduction of maturing hematopoietic elements; numerous lymphoid aggregates consistent with involvement by CLL, representing approximately 50% of marrow cellularity by flow cytometry  ATG/cyclosporine at [facility] beginning 03/19/2019 followed by prednisone taper  2.Hypothyroidism 3.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 Augmentin prescribed. 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 Pancytopenia secondary to CLL and a hypocellular bone marrow  G-CSF and Nplate started 8/59/2924, G-CSF changed to daily beginning 12/17/2018; G-CSF discontinued 12/31/2018, last Nplate 01/15/2019  Began prednisone 60 mg daily 01/11/19, tolerating moderately well except jitteriness, irritability, and difficulty sleeping. Tapered to 40 mg daily x1 week starting 01/24/19, reduce by 10 mg each week until discontinued. Prednisone discontinued 02/20/2019.  Promacta started 01/20/2019, dose increased to 100 mg daily 02/04/2019; dose increased to 150 mg daily 02/21/2019  IVIG daily for 2 days beginning 02/25/2019   9. Severe headache and nausea/vomiting 02/27/2019-likely related to IVIG therapy,improved    Disposition: Meagan Walsh appears stable.  She will not receive transfusions today.  Cyclosporine level is in the therapeutic range.  She will continue cyclosporine at the current dose.  Meagan Walsh will call for any fever or bleeding.  The [place] has been slightly higher over the past several days, hopefully this trend will continue.  She will return for a CBC and transfusion support as  needed on 23 2020-25 2020.  She will be scheduled for an office visit on 04/21/2019.    Betsy Coder, MD  04/14/2019  10:18 AM

## 2019-04-14 NOTE — Telephone Encounter (Signed)
CRITICAL VALUE STICKER  CRITICAL VALUE: ANC = 0.3  RECEIVER (on-site recipient of call): Cherylynn Ridges RN, Triage [place] NOTIFIED: 04/14/2019 at [place] (representative from lab): Dianne MT Myles Gip  MD NOTIFIED: Collaborative for Dr. Benay Spice.  TIME OF NOTIFICATION: 04/14/2019 at 0914.  RESPONSE: None

## 2019-04-15 ENCOUNTER — Telehealth: Payer: Self-pay | Admitting: Oncology

## 2019-04-15 NOTE — Telephone Encounter (Signed)
Called and spoke with patient. Confirmed appt  °

## 2019-04-16 ENCOUNTER — Other Ambulatory Visit: Payer: Self-pay | Admitting: *Deleted

## 2019-04-16 ENCOUNTER — Inpatient Hospital Stay: Payer: BC Managed Care – PPO

## 2019-04-16 ENCOUNTER — Other Ambulatory Visit: Payer: Self-pay

## 2019-04-16 ENCOUNTER — Telehealth: Payer: Self-pay | Admitting: Oncology

## 2019-04-16 DIAGNOSIS — R5381 Other malaise: Secondary | ICD-10-CM | POA: Diagnosis not present

## 2019-04-16 DIAGNOSIS — C9110 Chronic lymphocytic leukemia of B-cell type not having achieved remission: Secondary | ICD-10-CM

## 2019-04-16 DIAGNOSIS — R109 Unspecified abdominal pain: Secondary | ICD-10-CM | POA: Diagnosis not present

## 2019-04-16 DIAGNOSIS — Z79899 Other long term (current) drug therapy: Secondary | ICD-10-CM | POA: Diagnosis not present

## 2019-04-16 DIAGNOSIS — E039 Hypothyroidism, unspecified: Secondary | ICD-10-CM | POA: Diagnosis not present

## 2019-04-16 DIAGNOSIS — D801 Nonfamilial hypogammaglobulinemia: Secondary | ICD-10-CM | POA: Diagnosis not present

## 2019-04-16 DIAGNOSIS — K068 Other specified disorders of gingiva and edentulous alveolar ridge: Secondary | ICD-10-CM | POA: Diagnosis not present

## 2019-04-16 DIAGNOSIS — D696 Thrombocytopenia, unspecified: Secondary | ICD-10-CM

## 2019-04-16 DIAGNOSIS — D61818 Other pancytopenia: Secondary | ICD-10-CM | POA: Diagnosis not present

## 2019-04-16 DIAGNOSIS — Z23 Encounter for immunization: Secondary | ICD-10-CM | POA: Diagnosis not present

## 2019-04-16 DIAGNOSIS — D649 Anemia, unspecified: Secondary | ICD-10-CM

## 2019-04-16 DIAGNOSIS — R233 Spontaneous ecchymoses: Secondary | ICD-10-CM | POA: Diagnosis not present

## 2019-04-16 LAB — CBC WITH DIFFERENTIAL (CANCER CENTER ONLY)
Abs Immature Granulocytes: 0.01 10*3/uL (ref 0.00–0.07)
Basophils Absolute: 0 10*3/uL (ref 0.0–0.1)
Basophils Relative: 0 %
Eosinophils Absolute: 0 10*3/uL (ref 0.0–0.5)
Eosinophils Relative: 0 %
HCT: 24.2 % — ABNORMAL LOW (ref 36.0–46.0)
Hemoglobin: 8.3 g/dL — ABNORMAL LOW (ref 12.0–15.0)
Immature Granulocytes: 0 %
Lymphocytes Relative: 90 %
Lymphs Abs: 3.7 10*3/uL (ref 0.7–4.0)
MCH: 28.7 pg (ref 26.0–34.0)
MCHC: 34.3 g/dL (ref 30.0–36.0)
MCV: 83.7 fL (ref 80.0–100.0)
Monocytes Absolute: 0.1 10*3/uL (ref 0.1–1.0)
Monocytes Relative: 2 %
Neutro Abs: 0.3 10*3/uL — CL (ref 1.7–7.7)
Neutrophils Relative %: 8 %
Platelet Count: 11 10*3/uL — ABNORMAL LOW (ref 150–400)
RBC: 2.89 MIL/uL — ABNORMAL LOW (ref 3.87–5.11)
RDW: 12.7 % (ref 11.5–15.5)
WBC Count: 4.3 10*3/uL (ref 4.0–10.5)
nRBC: 0 % (ref 0.0–0.2)

## 2019-04-16 LAB — SAMPLE TO BLOOD BANK

## 2019-04-16 MED ORDER — SODIUM CHLORIDE 0.9% FLUSH
10.0000 mL | INTRAVENOUS | Status: DC | PRN
  Filled 2019-04-16: qty 10

## 2019-04-16 MED ORDER — SODIUM CHLORIDE 0.9% IV SOLUTION
250.0000 mL | Freq: Once | INTRAVENOUS | Status: DC
  Filled 2019-04-16: qty 250

## 2019-04-16 MED ORDER — SODIUM CHLORIDE 0.9% IV SOLUTION
250.0000 mL | Freq: Once | INTRAVENOUS | Status: AC
  Administered 2019-04-16: 250 mL via INTRAVENOUS
  Filled 2019-04-16: qty 250

## 2019-04-16 MED ORDER — HEPARIN SOD (PORK) LOCK FLUSH 100 UNIT/ML IV SOLN
250.0000 [IU] | INTRAVENOUS | Status: DC | PRN
  Filled 2019-04-16: qty 5

## 2019-04-16 MED ORDER — HEPARIN SOD (PORK) LOCK FLUSH 100 UNIT/ML IV SOLN
250.0000 [IU] | INTRAVENOUS | Status: AC | PRN
  Administered 2019-04-16: 250 [IU]
  Filled 2019-04-16: qty 5

## 2019-04-16 MED ORDER — SODIUM CHLORIDE 0.9% FLUSH
3.0000 mL | INTRAVENOUS | Status: AC | PRN
  Administered 2019-04-16: 3 mL
  Filled 2019-04-16: qty 10

## 2019-04-16 NOTE — Progress Notes (Signed)
Per Dr. Benay Spice: Order crossmatched platelets for Friday. Orders placed and blood bank notified. Per Dr. Benay Spice: Will transfuse if platelets are equal to or less than 15,00 (she is having bleeding gums) and will need blood if Hgb is < 8.5. Reminded patient we are doing cyclosporin level on Friday as well, so hold dose till after labs drawn. Needs lab scheduled for 9/25--scheduling message sent.

## 2019-04-16 NOTE — Progress Notes (Signed)
Encounter opened for lab draw and PICC access.

## 2019-04-16 NOTE — Addendum Note (Signed)
Addended by: Paulla Dolly on: 04/16/2019 12:24 PM   Modules accepted: Orders

## 2019-04-16 NOTE — Progress Notes (Signed)
Received critical value.  ANC 0.3.  Message given to Merceda Elks, RN with Dr. Benay Spice @ [phone]

## 2019-04-16 NOTE — Telephone Encounter (Signed)
Added lab to 9/25 infusion. Confirmed with patient.

## 2019-04-16 NOTE — Patient Instructions (Signed)
Blood Transfusion, Adult, Care After This sheet gives you information about how to care for yourself after your procedure. Your doctor may also give you more specific instructions. If you have problems or questions, contact your doctor. Follow these instructions at home:   Take over-the-counter and prescription medicines only as told by your doctor.  Go back to your normal activities as told by your doctor.  Follow instructions from your doctor about how to take care of the area where an IV tube was put into your vein (insertion site). Make sure you: ? Wash your hands with soap and water before you change your bandage (dressing). If there is no soap and water, use hand sanitizer. ? Change your bandage as told by your doctor.  Check your IV insertion site every day for signs of infection. Check for: ? More redness, swelling, or pain. ? More fluid or blood. ? Warmth. ? Pus or a bad smell. Contact a doctor if:  You have more redness, swelling, or pain around the IV insertion site.  You have more fluid or blood coming from the IV insertion site.  Your IV insertion site feels warm to the touch.  You have pus or a bad smell coming from the IV insertion site.  Your pee (urine) turns pink, red, or brown.  You feel weak after doing your normal activities. Get help right away if:  You have signs of a serious allergic or body defense (immune) system reaction, including: ? Itchiness. ? Hives. ? Trouble breathing. ? Anxiety. ? Pain in your chest or lower back. ? Fever, flushing, and chills. ? Fast pulse. ? Rash. ? Watery poop (diarrhea). ? Throwing up (vomiting). ? Dark pee. ? Serious headache. ? Dizziness. ? Stiff neck. ? Yellow color in your face or the white parts of your eyes (jaundice). Summary  After a blood transfusion, return to your normal activities as told by your doctor.  Every day, check for signs of infection where the IV tube was put into your vein.  Some  signs of infection are warm skin, more redness and pain, more fluid or blood, and pus or a bad smell where the needle went in.  Contact your doctor if you feel weak or have any unusual symptoms. This information is not intended to replace advice given to you by your health care provider. Make sure you discuss any questions you have with your health care provider. Document Released: 07/31/2014 Document Revised: 11/14/2017 Document Reviewed: 03/03/2016 Elsevier Patient Education  2020 Elsevier Inc.  

## 2019-04-17 LAB — BPAM RBC
Blood Product Expiration Date: 202010192359
ISSUE DATE / TIME: 202009230909
Unit Type and Rh: 5100

## 2019-04-17 LAB — PREPARE PLATELET PHERESIS: Unit division: 0

## 2019-04-17 LAB — TYPE AND SCREEN
ABO/RH(D): O POS
ABO/RH(D): O POS
Antibody Screen: NEGATIVE
Antibody Screen: NEGATIVE
Unit division: 0

## 2019-04-17 LAB — BPAM PLATELET PHERESIS
Blood Product Expiration Date: 202009232359
ISSUE DATE / TIME: 202009231105
Unit Type and Rh: 5100

## 2019-04-18 ENCOUNTER — Telehealth: Payer: Self-pay | Admitting: *Deleted

## 2019-04-18 ENCOUNTER — Other Ambulatory Visit: Payer: Self-pay | Admitting: *Deleted

## 2019-04-18 ENCOUNTER — Telehealth: Payer: Self-pay

## 2019-04-18 ENCOUNTER — Inpatient Hospital Stay: Payer: BC Managed Care – PPO

## 2019-04-18 ENCOUNTER — Other Ambulatory Visit: Payer: Self-pay

## 2019-04-18 DIAGNOSIS — R109 Unspecified abdominal pain: Secondary | ICD-10-CM | POA: Diagnosis not present

## 2019-04-18 DIAGNOSIS — Z79899 Other long term (current) drug therapy: Secondary | ICD-10-CM | POA: Diagnosis not present

## 2019-04-18 DIAGNOSIS — C9110 Chronic lymphocytic leukemia of B-cell type not having achieved remission: Secondary | ICD-10-CM

## 2019-04-18 DIAGNOSIS — D801 Nonfamilial hypogammaglobulinemia: Secondary | ICD-10-CM | POA: Diagnosis not present

## 2019-04-18 DIAGNOSIS — D61818 Other pancytopenia: Secondary | ICD-10-CM | POA: Diagnosis not present

## 2019-04-18 DIAGNOSIS — Z95828 Presence of other vascular implants and grafts: Secondary | ICD-10-CM

## 2019-04-18 DIAGNOSIS — D696 Thrombocytopenia, unspecified: Secondary | ICD-10-CM

## 2019-04-18 DIAGNOSIS — K068 Other specified disorders of gingiva and edentulous alveolar ridge: Secondary | ICD-10-CM | POA: Diagnosis not present

## 2019-04-18 DIAGNOSIS — Z23 Encounter for immunization: Secondary | ICD-10-CM | POA: Diagnosis not present

## 2019-04-18 DIAGNOSIS — E039 Hypothyroidism, unspecified: Secondary | ICD-10-CM | POA: Diagnosis not present

## 2019-04-18 DIAGNOSIS — R233 Spontaneous ecchymoses: Secondary | ICD-10-CM | POA: Diagnosis not present

## 2019-04-18 DIAGNOSIS — R5381 Other malaise: Secondary | ICD-10-CM | POA: Diagnosis not present

## 2019-04-18 LAB — CBC WITH DIFFERENTIAL (CANCER CENTER ONLY)
Abs Immature Granulocytes: 0 10*3/uL (ref 0.00–0.07)
Basophils Absolute: 0 10*3/uL (ref 0.0–0.1)
Basophils Relative: 0 %
Eosinophils Absolute: 0 10*3/uL (ref 0.0–0.5)
Eosinophils Relative: 0 %
HCT: 26.5 % — ABNORMAL LOW (ref 36.0–46.0)
Hemoglobin: 9.2 g/dL — ABNORMAL LOW (ref 12.0–15.0)
Immature Granulocytes: 0 %
Lymphocytes Relative: 84 %
Lymphs Abs: 2.2 10*3/uL (ref 0.7–4.0)
MCH: 29.3 pg (ref 26.0–34.0)
MCHC: 34.7 g/dL (ref 30.0–36.0)
MCV: 84.4 fL (ref 80.0–100.0)
Monocytes Absolute: 0.1 10*3/uL (ref 0.1–1.0)
Monocytes Relative: 5 %
Neutro Abs: 0.3 10*3/uL — CL (ref 1.7–7.7)
Neutrophils Relative %: 11 %
Platelet Count: 21 10*3/uL — ABNORMAL LOW (ref 150–400)
RBC: 3.14 MIL/uL — ABNORMAL LOW (ref 3.87–5.11)
RDW: 13.2 % (ref 11.5–15.5)
WBC Count: 2.7 10*3/uL — ABNORMAL LOW (ref 4.0–10.5)
nRBC: 0 % (ref 0.0–0.2)

## 2019-04-18 LAB — CMP (CANCER CENTER ONLY)
ALT: 61 U/L — ABNORMAL HIGH (ref 0–44)
AST: 40 U/L (ref 15–41)
Albumin: 3.7 g/dL (ref 3.5–5.0)
Alkaline Phosphatase: 83 U/L (ref 38–126)
Anion gap: 8 (ref 5–15)
BUN: 22 mg/dL — ABNORMAL HIGH (ref 6–20)
CO2: 24 mmol/L (ref 22–32)
Calcium: 9.4 mg/dL (ref 8.9–10.3)
Chloride: 106 mmol/L (ref 98–111)
Creatinine: 1.1 mg/dL — ABNORMAL HIGH (ref 0.44–1.00)
GFR, Est AFR Am: >60 mL/min (ref >60)
GFR, Estimated: 55 mL/min — ABNORMAL LOW (ref >60)
Glucose, Bld: 175 mg/dL — ABNORMAL HIGH (ref 70–99)
Potassium: 4 mmol/L (ref 3.5–5.1)
Sodium: 138 mmol/L (ref 135–145)
Total Bilirubin: 0.6 mg/dL (ref 0.3–1.2)
Total Protein: 6 g/dL — ABNORMAL LOW (ref 6.5–8.1)

## 2019-04-18 LAB — SAMPLE TO BLOOD BANK

## 2019-04-18 MED ORDER — SODIUM CHLORIDE 0.9% FLUSH
10.0000 mL | INTRAVENOUS | Status: DC | PRN
  Administered 2019-04-18: 10 mL
  Filled 2019-04-18: qty 10

## 2019-04-18 MED ORDER — SULFAMETHOXAZOLE-TRIMETHOPRIM 800-160 MG PO TABS: 1.0000 | ORAL_TABLET | ORAL | 0 refills | Status: DC

## 2019-04-18 MED ORDER — ONDANSETRON 8 MG PO TBDP: 8.0000 mg | ORAL_TABLET | Freq: Three times a day (TID) | ORAL | 1 refills | Status: DC | PRN

## 2019-04-18 MED ORDER — PROCHLORPERAZINE MALEATE 5 MG PO TABS: 5.0000 mg | ORAL_TABLET | Freq: Four times a day (QID) | ORAL | 1 refills | Status: DC | PRN

## 2019-04-18 MED ORDER — HEPARIN SOD (PORK) LOCK FLUSH 100 UNIT/ML IV SOLN
500.0000 [IU] | Freq: Once | INTRAVENOUS | Status: AC | PRN
  Administered 2019-04-18: 250 [IU]
  Filled 2019-04-18: qty 5

## 2019-04-18 NOTE — Telephone Encounter (Signed)
Patient's hemoglobin today is 9.2 and platelet count is 21. Dr. Gearldine Shown nurse Manuela Schwartz made aware and per Dr. Gearldine Shown parameters only transfuse for hemoglobin less than 8.5 and platelet count less than 15,000. Patient made aware no transfusions needed today. Patient verbalized understanding.

## 2019-04-18 NOTE — Telephone Encounter (Signed)
CRITICAL VALUE STICKER  CRITICAL VALUE: ANC = 0.3.  RECEIVER (on-site recipient of call): Mining engineer, Triage [place].   DATE & TIME NOTIFIED: 04/18/2019 at 0949.   MESSENGER (representative from lab): Rhonda MT [facility].  MD NOTIFIED: Collaborative.  TIME OF NOTIFICATION: 04/18/2019 at 0952.  RESPONSE: None.

## 2019-04-18 NOTE — Telephone Encounter (Signed)
Transfusion not needed today since pltc is 21,000 and Hgb 9.2. ANC is stable. She will keep blood bank bracelet on for crossmatched platelets on Monday. Notified blood bank to release platelets today and plan to transfuse on Monday. Requesting refills on Bactrim DS and Prochlorperazine, which is not helping a lot. Informed her she can take 1-2 tabs and we will send in script for zofran ODT that she can take during the day w/less sedation. One of her main complaints is feeling sluggish and weak. She also denies any fever or s/s of infection or bleeding. Ned Card, NP is aware of counts today, which are stable.

## 2019-04-20 LAB — CYCLOSPORINE: Cyclosporine, LabCorp: 320 ng/mL (ref 100–400)

## 2019-04-21 ENCOUNTER — Other Ambulatory Visit: Payer: Self-pay

## 2019-04-21 ENCOUNTER — Inpatient Hospital Stay: Payer: BC Managed Care – PPO

## 2019-04-21 ENCOUNTER — Encounter: Payer: Self-pay | Admitting: *Deleted

## 2019-04-21 ENCOUNTER — Inpatient Hospital Stay (HOSPITAL_BASED_OUTPATIENT_CLINIC_OR_DEPARTMENT_OTHER): Payer: BC Managed Care – PPO | Admitting: Nurse Practitioner

## 2019-04-21 ENCOUNTER — Encounter: Payer: Self-pay | Admitting: Nurse Practitioner

## 2019-04-21 VITALS — BP 127/72 | HR 76 | Temp 98.9°F | Resp 16

## 2019-04-21 VITALS — BP 126/67 | HR 93 | Temp 98.9°F | Resp 17 | Ht 66.0 in | Wt 141.1 lb

## 2019-04-21 DIAGNOSIS — D61818 Other pancytopenia: Secondary | ICD-10-CM | POA: Diagnosis not present

## 2019-04-21 DIAGNOSIS — Z23 Encounter for immunization: Secondary | ICD-10-CM

## 2019-04-21 DIAGNOSIS — C9110 Chronic lymphocytic leukemia of B-cell type not having achieved remission: Secondary | ICD-10-CM

## 2019-04-21 DIAGNOSIS — R5381 Other malaise: Secondary | ICD-10-CM | POA: Diagnosis not present

## 2019-04-21 DIAGNOSIS — E039 Hypothyroidism, unspecified: Secondary | ICD-10-CM | POA: Diagnosis not present

## 2019-04-21 DIAGNOSIS — D696 Thrombocytopenia, unspecified: Secondary | ICD-10-CM

## 2019-04-21 DIAGNOSIS — D801 Nonfamilial hypogammaglobulinemia: Secondary | ICD-10-CM | POA: Diagnosis not present

## 2019-04-21 DIAGNOSIS — Z79899 Other long term (current) drug therapy: Secondary | ICD-10-CM | POA: Diagnosis not present

## 2019-04-21 DIAGNOSIS — Z95828 Presence of other vascular implants and grafts: Secondary | ICD-10-CM

## 2019-04-21 DIAGNOSIS — R233 Spontaneous ecchymoses: Secondary | ICD-10-CM | POA: Diagnosis not present

## 2019-04-21 DIAGNOSIS — R109 Unspecified abdominal pain: Secondary | ICD-10-CM | POA: Diagnosis not present

## 2019-04-21 DIAGNOSIS — K068 Other specified disorders of gingiva and edentulous alveolar ridge: Secondary | ICD-10-CM | POA: Diagnosis not present

## 2019-04-21 LAB — CBC WITH DIFFERENTIAL (CANCER CENTER ONLY)
Abs Immature Granulocytes: 0.01 10*3/uL (ref 0.00–0.07)
Basophils Absolute: 0 10*3/uL (ref 0.0–0.1)
Basophils Relative: 0 %
Eosinophils Absolute: 0 10*3/uL (ref 0.0–0.5)
Eosinophils Relative: 0 %
HCT: 23.8 % — ABNORMAL LOW (ref 36.0–46.0)
Hemoglobin: 8.2 g/dL — ABNORMAL LOW (ref 12.0–15.0)
Immature Granulocytes: 1 %
Lymphocytes Relative: 76 %
Lymphs Abs: 1.7 10*3/uL (ref 0.7–4.0)
MCH: 29.3 pg (ref 26.0–34.0)
MCHC: 34.5 g/dL (ref 30.0–36.0)
MCV: 85 fL (ref 80.0–100.0)
Monocytes Absolute: 0.1 10*3/uL (ref 0.1–1.0)
Monocytes Relative: 6 %
Neutro Abs: 0.4 10*3/uL — CL (ref 1.7–7.7)
Neutrophils Relative %: 17 %
Platelet Count: 11 10*3/uL — ABNORMAL LOW (ref 150–400)
RBC: 2.8 MIL/uL — ABNORMAL LOW (ref 3.87–5.11)
RDW: 13.2 % (ref 11.5–15.5)
WBC Count: 2.2 10*3/uL — ABNORMAL LOW (ref 4.0–10.5)
nRBC: 0 % (ref 0.0–0.2)

## 2019-04-21 LAB — SAMPLE TO BLOOD BANK

## 2019-04-21 LAB — PREPARE RBC (CROSSMATCH)

## 2019-04-21 MED ORDER — INFLUENZA VAC SPLIT QUAD 0.5 ML IM SUSY
PREFILLED_SYRINGE | INTRAMUSCULAR | Status: AC
  Filled 2019-04-21: qty 0.5

## 2019-04-21 MED ORDER — SODIUM CHLORIDE 0.9% FLUSH
10.0000 mL | INTRAVENOUS | Status: AC | PRN
  Administered 2019-04-21: 10 mL
  Filled 2019-04-21: qty 10

## 2019-04-21 MED ORDER — POSACONAZOLE 100 MG PO TBEC: 300.0000 mg | DELAYED_RELEASE_TABLET | Freq: Every day | ORAL | 1 refills | Status: DC

## 2019-04-21 MED ORDER — HEPARIN SOD (PORK) LOCK FLUSH 100 UNIT/ML IV SOLN
250.0000 [IU] | INTRAVENOUS | Status: AC | PRN
  Administered 2019-04-21: 250 [IU]
  Filled 2019-04-21: qty 5

## 2019-04-21 MED ORDER — SODIUM CHLORIDE 0.9% IV SOLUTION
250.0000 mL | Freq: Once | INTRAVENOUS | Status: AC
  Administered 2019-04-21: 250 mL via INTRAVENOUS
  Filled 2019-04-21: qty 250

## 2019-04-21 MED ORDER — SODIUM CHLORIDE 0.9% FLUSH
10.0000 mL | INTRAVENOUS | Status: DC | PRN
  Administered 2019-04-21: 10 mL
  Filled 2019-04-21: qty 10

## 2019-04-21 MED ORDER — HEPARIN SOD (PORK) LOCK FLUSH 100 UNIT/ML IV SOLN
500.0000 [IU] | Freq: Every day | INTRAVENOUS | Status: DC | PRN
  Filled 2019-04-21: qty 5

## 2019-04-21 MED ORDER — HEPARIN SOD (PORK) LOCK FLUSH 100 UNIT/ML IV SOLN
250.0000 [IU] | INTRAVENOUS | Status: DC | PRN
  Filled 2019-04-21: qty 5

## 2019-04-21 MED ORDER — SODIUM CHLORIDE 0.9% FLUSH
3.0000 mL | INTRAVENOUS | Status: DC | PRN
  Filled 2019-04-21: qty 10

## 2019-04-21 MED ORDER — INFLUENZA VAC SPLIT QUAD 0.5 ML IM SUSY
0.5000 mL | PREFILLED_SYRINGE | Freq: Once | INTRAMUSCULAR | Status: AC
  Administered 2019-04-21: 0.5 mL via INTRAMUSCULAR

## 2019-04-21 MED ORDER — SODIUM CHLORIDE 0.9% FLUSH
10.0000 mL | INTRAVENOUS | Status: DC | PRN
  Filled 2019-04-21: qty 10

## 2019-04-21 NOTE — Progress Notes (Signed)
Faxed cyclosporin results from draw on 04/18/19 to [place] [phone] att: Joellen Jersey, PharmD.

## 2019-04-21 NOTE — Patient Instructions (Signed)
Blood Transfusion, Adult, Care After This sheet gives you information about how to care for yourself after your procedure. Your doctor may also give you more specific instructions. If you have problems or questions, contact your doctor. Follow these instructions at home:   Take over-the-counter and prescription medicines only as told by your doctor.  Go back to your normal activities as told by your doctor.  Follow instructions from your doctor about how to take care of the area where an IV tube was put into your vein (insertion site). Make sure you: ? Wash your hands with soap and water before you change your bandage (dressing). If there is no soap and water, use hand sanitizer. ? Change your bandage as told by your doctor.  Check your IV insertion site every day for signs of infection. Check for: ? More redness, swelling, or pain. ? More fluid or blood. ? Warmth. ? Pus or a bad smell. Contact a doctor if:  You have more redness, swelling, or pain around the IV insertion site.  You have more fluid or blood coming from the IV insertion site.  Your IV insertion site feels warm to the touch.  You have pus or a bad smell coming from the IV insertion site.  Your pee (urine) turns pink, red, or brown.  You feel weak after doing your normal activities. Get help right away if:  You have signs of a serious allergic or body defense (immune) system reaction, including: ? Itchiness. ? Hives. ? Trouble breathing. ? Anxiety. ? Pain in your chest or lower back. ? Fever, flushing, and chills. ? Fast pulse. ? Rash. ? Watery poop (diarrhea). ? Throwing up (vomiting). ? Dark pee. ? Serious headache. ? Dizziness. ? Stiff neck. ? Yellow color in your face or the white parts of your eyes (jaundice). Summary  After a blood transfusion, return to your normal activities as told by your doctor.  Every day, check for signs of infection where the IV tube was put into your vein.  Some  signs of infection are warm skin, more redness and pain, more fluid or blood, and pus or a bad smell where the needle went in.  Contact your doctor if you feel weak or have any unusual symptoms. This information is not intended to replace advice given to you by your health care provider. Make sure you discuss any questions you have with your health care provider. Document Released: 07/31/2014 Document Revised: 11/14/2017 Document Reviewed: 03/03/2016 Elsevier Patient Education  2020 Elsevier Inc.    Platelet Transfusion A platelet transfusion is a procedure in which you receive donated platelets through an IV. Platelets are tiny pieces of blood cells. When you get an injury, platelets clump together in the area to form a blood clot. This helps stop bleeding and is the beginning of the healing process. If you have too few platelets, your blood may have trouble clotting. This may cause you to bleed and bruise very easily. You may need a platelet transfusion if you have a condition that causes a low number of platelets (thrombocytopenia). A platelet transfusion may be used to stop or prevent excessive bleeding. Tell a health care provider about:  Any reactions you have had during previous transfusions.  Any allergies you have.  All medicines you are taking, including vitamins, herbs, eye drops, creams, and over-the-counter medicines.  Any blood disorders you have.  Any surgeries you have had.  Any medical conditions you have.  Whether you are pregnant or may   be pregnant. What are the risks? Generally, this is a safe procedure. However, problems may occur, including:  Fever.  Infection.  Allergic reaction to the donor platelets.  Your body's disease-fighting system (immune system) attacking the donor platelets (hemolytic reaction). This is rare.  A rare reaction that causes lung damage (transfusion-related acute lung injury). What happens before the procedure? Medicines  Ask  your health care provider about: ? Changing or stopping your regular medicines. This is especially important if you are taking diabetes medicines or blood thinners. ? Taking medicines such as aspirin and ibuprofen. These medicines can thin your blood. Do not take these medicines unless your health care provider tells you to take them. ? Taking over-the-counter medicines, vitamins, herbs, and supplements. General instructions  You will have a blood test to determine your blood type. Your blood type determines what kind of platelets you will be given.  Follow instructions from your health care provider about eating or drinking restrictions.  If you have had an allergic reaction to a transfusion in the past, you may be given medicine to help prevent a reaction.  Your temperature, blood pressure, pulse, and breathing will be monitored. What happens during the procedure?   An IV will be inserted into one of your veins.  For your safety, two health care providers will verify your identity along with the donor platelets about to be infused.  A bag of donor platelets will be connected to your IV. The platelets will flow into your bloodstream. This usually takes 30-60 minutes.  Your temperature, blood pressure, pulse, and breathing will be monitored during the transfusion. This helps detect early signs of any reaction.  You will also be monitored for other symptoms that may indicate a reaction, including chills, hives, or itching.  If you have signs of a reaction at any time, your transfusion will be stopped, and you may be given medicine to help manage the reaction.  When your transfusion is complete, your IV will be removed.  Pressure may be applied to the IV site for a few minutes to stop any bleeding.  The IV site will be covered with a bandage (dressing). The procedure may vary among health care providers and hospitals. What happens after the procedure?  Your blood pressure,  temperature, pulse, and breathing will be monitored until you leave the hospital or clinic.  You may have some bruising and soreness at your IV site. Follow these instructions at home: Medicines  Take over-the-counter and prescription medicines only as told by your health care provider.  Talk with your health care provider before you take any medicines that contain aspirin or NSAIDs. These medicines increase your risk for dangerous bleeding. General instructions  Change or remove your dressing as told by your health care provider.  Return to your normal activities as told by your health care provider. Ask your health care provider what activities are safe for you.  Do not take baths, swim, or use a hot tub until your health care provider approves. Ask your health care provider if you may take showers.  Check your IV site every day for signs of infection. Check for: ? Redness, swelling, or pain. ? Fluid or blood. If fluid or blood drains from your IV site, use your hands to press down firmly on a bandage covering the area for a minute or two. Doing this should stop the bleeding. ? Warmth. ? Pus or a bad smell.  Keep all follow-up visits as told by your health   care provider. This is important. Contact a health care provider if you have:  A headache that does not go away with medicine.  Hives, rash, or itchy skin.  Nausea or vomiting.  Unusual tiredness or weakness.  Signs of infection at your IV site. Get help right away if:  You have a fever or chills.  You urinate less often than usual.  Your urine is darker colored than normal.  You have any of the following: ? Trouble breathing. ? Pain in your back, abdomen, or chest. ? Cool, clammy skin. ? A fast heartbeat. Summary  Platelets are tiny pieces of blood cells that clump together to form a blood clot when you have an injury. If you have too few platelets, your blood may have trouble clotting.  A platelet transfusion  is a procedure in which you receive donated platelets through an IV.  A platelet transfusion may be used to stop or prevent excessive bleeding.  After the procedure, check your IV site every day for signs of infection, including redness, swelling, pain, or warmth. This information is not intended to replace advice given to you by your health care provider. Make sure you discuss any questions you have with your health care provider. Document Released: 05/07/2007 Document Revised: 08/15/2017 Document Reviewed: 08/15/2017 Elsevier Patient Education  2020 Elsevier Inc.  

## 2019-04-21 NOTE — Progress Notes (Addendum)
[facility] OFFICE PROGRESS NOTE   Diagnosis: CLL, aplastic anemia  INTERVAL HISTORY:   Meagan Walsh returns as scheduled.  She was transfused a unit of platelets and a unit of blood on 04/16/2019.  She continues to note gum bleeding.  She is not taking Amicar as prescribed.  She estimates she is taking half the dose.  No other bleeding.  She feels "tired and winded".  No fever.  She continues to have nausea.  She noted some relief with Zofran ODT.  Objective:  Vital signs in last 24 hours:  Blood pressure 126/67, pulse 93, temperature 98.9 F (37.2 C), temperature source Oral, resp. rate 17, height _0  (1.676 m), weight 141 lb 1.6 oz (64 kg), SpO2 100 %.    HEENT: Small petechiae at the right buccal mucosa.  Multiple teeth with yellow discoloration at the gumline.  Some gum hypertrophy noted.  Question scattered areas of dried blood at the gumline. Resp: Lungs clear bilaterally. Cardio: Regular rate and rhythm. GI: Abdomen soft and nontender.  No hepatosplenomegaly. Vascular: No leg edema. Neuro: Alert and oriented. Skin: Resolving ecchymosis right lower leg. Right upper extremity PIC without erythema.   Lab Results:  Lab Results  Component Value Date   WBC 2.2 (L) 04/21/2019   HGB 8.2 (L) 04/21/2019   HCT 23.8 (L) 04/21/2019   MCV 85.0 04/21/2019   PLT 11 (L) 04/21/2019   NEUTROABS 0.4 (LL) 04/21/2019    Imaging:  No results found.  Medications: I have reviewed the patient's current medications.  Assessment/Plan: 1. 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 Mild splenomegaly.  Ultrasound-guided biopsy of the left inguinal lymph node 09/18/2017, slightly "purulent  "fluid aspirated, core biopsy is consistent with an atypical lymphoid proliferation-extensive necrosis with surrounding epithelioid histiocytes, limited intact lymphoid tissue involved with CLL  Incisional biopsy of a necrotic/purulent left inguinal lymph node on 10/01/2017-extensive necrosis with granulomatous inflammation, small amount of viable lymphoid tissue involved with CLL, AFB and fungal stains negative  Peripheral blood FISH analysis 02/05/2018-deletion 13q14, no evidence of p53 (17p13) deletion, no evidence of 11q22deletion  Bone marrow biopsy 02/26/2018-hypercellular marrow with extensive involvement by CLL, lymphocytes represent85% of all cells  Ibrutinib initiated 04/03/2018  Ibrutinib placed on hold 04/11/2018 due to onset of arthralgias  Ibrutinib resumed 04/16/2018, discontinued 04/25/2018 secondary to severe arthralgias/arthritis  Ibrutinib resumed at a dose of 140 mg daily 05/03/2018  Ibrutinib dose adjusted to 140 mg alternating with 241m 06/25/2018  Ibrutinib discontinued 07/03/2018 secondary to severe arthralgias  Acalabrutinib 08/16/2018, discontinued 11/15/2018 secondary to persistent severe transfusion dependent anemia and neutropenia/thrombocytopenia, last dose 11/14/2018  Bone marrow biopsy 11/21/2018-decreased cellularity, involvement by CLL, decreased erythroid and granulocytic precursors, decreased megakaryocytes  Cycle 1 rituximab 12/06/2018  Bone marrow biopsy 12/24/2018 at [facility]-hypocellular bone marrow (10%) involved by CLL, representing 50% of marrow cellularity; markedly decreased trilineage hematopoiesis including essentially absent erythropoiesis  Bone marrow biopsy 03/06/2027 [facility]- hypocellular bone marrow (20%) with marked reduction of maturing hematopoietic elements; numerous lymphoid aggregates consistent with involvement by CLL, representing approximately 50% of marrow cellularity by flow cytometry  [facility] by  prednisone taper  2.Hypothyroidism 3.Hepatitis B surface and core antibody positive  Hepatitis B surface antigen negative and hepatitis B core antibody -12/02/2018 4.Left lung pneumonia diagnosed 10/08/2017-completed 7  days of Levaquin 5.Left lung pneumoniaon chest x-ray 12/27/2017. Augmentin prescribed. 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 Pancytopenia secondary to CLL and a hypocellular bone marrow  G-CSF and Nplate started 2/63/3354, G-CSF changed to daily beginning 12/17/2018; G-CSF discontinued 12/31/2018, last Nplate 01/15/2019  Began prednisone 60 mg daily 01/11/19, tolerating moderately well except jitteriness, irritability, and difficulty sleeping. Tapered to 40 mg daily x1 week starting 01/24/19, reduce by 10 mg each week until discontinued. Prednisone discontinued 02/20/2019.  Promacta started 01/20/2019, dose increased to 100 mg daily 02/04/2019; dose increased to 150 mg daily 02/21/2019  IVIG daily for 2 days beginning 02/25/2019   9. Severe headache and nausea/vomiting 02/27/2019-likely related to IVIG therapy,improved   Disposition: Meagan Walsh appears stable.  We reviewed the CBC from today.  She has progressive anemia.  She is symptomatic.  Platelet count is 11,000.  She continues to have gum bleeding.  She will receive 1 unit of blood and 1 unit of platelets today.  The [place] is slightly higher.  She will return for a repeat CBC on 04/25/2019.    She will return for a CBC on 04/25/2019.  She will return for lab and follow-up on 04/28/2019.  She will contact the office in the interim with any problems.  Patient seen with Dr. Benay Spice.  25 minutes were spent face-to-face at today's visit with the majority of that time involved in counseling/coordination of care.      Ned Card  ANP/GNP-BC   04/21/2019  11:33 AM This was a shared visit with Ned Card.  Meagan Walsh was interviewed and examined.  Meagan Walsh is now at day 9 following the initiation of ATG and cyclosporine.  She has intermittent nausea, potentially related to polypharmacy.  We will limit oral medications as possible.  Hopefully the rise in the neutrophil count over the past week is a sign of a response to therapy.  [name], MD

## 2019-04-22 ENCOUNTER — Telehealth: Payer: Self-pay | Admitting: *Deleted

## 2019-04-22 LAB — TYPE AND SCREEN
ABO/RH(D): O POS
Antibody Screen: NEGATIVE
Unit division: 0

## 2019-04-22 LAB — BPAM RBC
Blood Product Expiration Date: 202010262359
ISSUE DATE / TIME: 202009281404
Unit Type and Rh: 5100

## 2019-04-22 LAB — BPAM PLATELET PHERESIS
Blood Product Expiration Date: 202009282359
ISSUE DATE / TIME: 202009281257
Unit Type and Rh: 5100

## 2019-04-22 LAB — PREPARE PLATELET PHERESIS: Unit division: 0

## 2019-04-22 NOTE — Telephone Encounter (Signed)
[facility] faxed Prior authorization request for Posaconazole 100 mg tablets.  Request to Managed Care letter tray receptacle of Prior Authorization requests and forms for review.

## 2019-04-23 ENCOUNTER — Telehealth: Payer: Self-pay | Admitting: Oncology

## 2019-04-23 NOTE — Telephone Encounter (Signed)
I talk with patient regarding schedule  

## 2019-04-25 ENCOUNTER — Inpatient Hospital Stay: Payer: BC Managed Care – PPO

## 2019-04-25 ENCOUNTER — Other Ambulatory Visit: Payer: Self-pay

## 2019-04-25 ENCOUNTER — Telehealth: Payer: Self-pay | Admitting: *Deleted

## 2019-04-25 ENCOUNTER — Telehealth: Payer: Self-pay

## 2019-04-25 ENCOUNTER — Inpatient Hospital Stay: Payer: BC Managed Care – PPO | Attending: Oncology

## 2019-04-25 ENCOUNTER — Other Ambulatory Visit: Payer: Self-pay | Admitting: Psychiatry

## 2019-04-25 DIAGNOSIS — C9110 Chronic lymphocytic leukemia of B-cell type not having achieved remission: Secondary | ICD-10-CM | POA: Insufficient documentation

## 2019-04-25 DIAGNOSIS — D61818 Other pancytopenia: Secondary | ICD-10-CM | POA: Insufficient documentation

## 2019-04-25 DIAGNOSIS — E039 Hypothyroidism, unspecified: Secondary | ICD-10-CM | POA: Insufficient documentation

## 2019-04-25 DIAGNOSIS — R7989 Other specified abnormal findings of blood chemistry: Secondary | ICD-10-CM | POA: Diagnosis not present

## 2019-04-25 DIAGNOSIS — B1910 Unspecified viral hepatitis B without hepatic coma: Secondary | ICD-10-CM | POA: Diagnosis not present

## 2019-04-25 DIAGNOSIS — D696 Thrombocytopenia, unspecified: Secondary | ICD-10-CM

## 2019-04-25 DIAGNOSIS — Z79899 Other long term (current) drug therapy: Secondary | ICD-10-CM | POA: Insufficient documentation

## 2019-04-25 DIAGNOSIS — R0600 Dyspnea, unspecified: Secondary | ICD-10-CM | POA: Insufficient documentation

## 2019-04-25 DIAGNOSIS — K068 Other specified disorders of gingiva and edentulous alveolar ridge: Secondary | ICD-10-CM | POA: Diagnosis not present

## 2019-04-25 DIAGNOSIS — Z8619 Personal history of other infectious and parasitic diseases: Secondary | ICD-10-CM | POA: Diagnosis not present

## 2019-04-25 DIAGNOSIS — R748 Abnormal levels of other serum enzymes: Secondary | ICD-10-CM | POA: Diagnosis not present

## 2019-04-25 DIAGNOSIS — Z95828 Presence of other vascular implants and grafts: Secondary | ICD-10-CM

## 2019-04-25 DIAGNOSIS — D801 Nonfamilial hypogammaglobulinemia: Secondary | ICD-10-CM | POA: Insufficient documentation

## 2019-04-25 LAB — CBC WITH DIFFERENTIAL (CANCER CENTER ONLY)
Abs Immature Granulocytes: 0.01 10*3/uL (ref 0.00–0.07)
Basophils Absolute: 0 10*3/uL (ref 0.0–0.1)
Basophils Relative: 0 %
Eosinophils Absolute: 0 10*3/uL (ref 0.0–0.5)
Eosinophils Relative: 0 %
HCT: 27.1 % — ABNORMAL LOW (ref 36.0–46.0)
Hemoglobin: 9.2 g/dL — ABNORMAL LOW (ref 12.0–15.0)
Immature Granulocytes: 1 %
Lymphocytes Relative: 68 %
Lymphs Abs: 1.5 10*3/uL (ref 0.7–4.0)
MCH: 29.2 pg (ref 26.0–34.0)
MCHC: 33.9 g/dL (ref 30.0–36.0)
MCV: 86 fL (ref 80.0–100.0)
Monocytes Absolute: 0.2 10*3/uL (ref 0.1–1.0)
Monocytes Relative: 9 %
Neutro Abs: 0.5 10*3/uL — ABNORMAL LOW (ref 1.7–7.7)
Neutrophils Relative %: 22 %
Platelet Count: 25 10*3/uL — ABNORMAL LOW (ref 150–400)
RBC: 3.15 MIL/uL — ABNORMAL LOW (ref 3.87–5.11)
RDW: 13.3 % (ref 11.5–15.5)
WBC Count: 2.1 10*3/uL — ABNORMAL LOW (ref 4.0–10.5)
nRBC: 0 % (ref 0.0–0.2)

## 2019-04-25 LAB — SAMPLE TO BLOOD BANK

## 2019-04-25 MED ORDER — SODIUM CHLORIDE 0.9% FLUSH
10.0000 mL | INTRAVENOUS | Status: DC | PRN
  Administered 2019-04-25: 10 mL
  Filled 2019-04-25: qty 10

## 2019-04-25 NOTE — Telephone Encounter (Addendum)
CRITICAL VALUE STICKER  CRITICAL VALUE: ANC 0.5  RECEIVER (on-site recipient of call): Lenox Ponds LPN  DATE & TIME NOTIFIED: 04/25/19 9:34 via voice mail  [place] (representative from lab): Domingo Mend  MD NOTIFIED: Dr. Benay Spice  TIME OF NOTIFICATION: 9:40   RESPONSE: Pt. Informed by Charlean Sanfilippo no transfusions needed.

## 2019-04-25 NOTE — Telephone Encounter (Signed)
Patient called that she is out of her posaconazole. Per pharmacy, they are waiting on a prior authorization to be completed. Need to call [employer] at [phone]. Forwarded this to managed care LPN, Lori in-basket. Per Dr. Benay Spice, she will be OK to go without med over the weekend.

## 2019-04-25 NOTE — Telephone Encounter (Signed)
Last fill 09/21

## 2019-04-26 ENCOUNTER — Inpatient Hospital Stay: Payer: BC Managed Care – PPO

## 2019-04-28 ENCOUNTER — Inpatient Hospital Stay (HOSPITAL_BASED_OUTPATIENT_CLINIC_OR_DEPARTMENT_OTHER): Payer: BC Managed Care – PPO | Admitting: Oncology

## 2019-04-28 ENCOUNTER — Other Ambulatory Visit: Payer: Self-pay

## 2019-04-28 ENCOUNTER — Telehealth: Payer: Self-pay | Admitting: *Deleted

## 2019-04-28 ENCOUNTER — Inpatient Hospital Stay: Payer: BC Managed Care – PPO

## 2019-04-28 VITALS — BP 111/68 | HR 95 | Temp 99.1°F | Resp 18 | Ht 66.0 in | Wt 140.3 lb

## 2019-04-28 DIAGNOSIS — C9110 Chronic lymphocytic leukemia of B-cell type not having achieved remission: Secondary | ICD-10-CM

## 2019-04-28 DIAGNOSIS — B1910 Unspecified viral hepatitis B without hepatic coma: Secondary | ICD-10-CM | POA: Diagnosis not present

## 2019-04-28 DIAGNOSIS — D61818 Other pancytopenia: Secondary | ICD-10-CM | POA: Diagnosis not present

## 2019-04-28 DIAGNOSIS — R0600 Dyspnea, unspecified: Secondary | ICD-10-CM | POA: Diagnosis not present

## 2019-04-28 DIAGNOSIS — E039 Hypothyroidism, unspecified: Secondary | ICD-10-CM | POA: Diagnosis not present

## 2019-04-28 DIAGNOSIS — R748 Abnormal levels of other serum enzymes: Secondary | ICD-10-CM | POA: Diagnosis not present

## 2019-04-28 DIAGNOSIS — R7989 Other specified abnormal findings of blood chemistry: Secondary | ICD-10-CM | POA: Diagnosis not present

## 2019-04-28 DIAGNOSIS — K068 Other specified disorders of gingiva and edentulous alveolar ridge: Secondary | ICD-10-CM | POA: Diagnosis not present

## 2019-04-28 DIAGNOSIS — Z79899 Other long term (current) drug therapy: Secondary | ICD-10-CM | POA: Diagnosis not present

## 2019-04-28 DIAGNOSIS — Z8619 Personal history of other infectious and parasitic diseases: Secondary | ICD-10-CM | POA: Diagnosis not present

## 2019-04-28 DIAGNOSIS — D801 Nonfamilial hypogammaglobulinemia: Secondary | ICD-10-CM | POA: Diagnosis not present

## 2019-04-28 LAB — CBC WITH DIFFERENTIAL (CANCER CENTER ONLY)
Abs Immature Granulocytes: 0 10*3/uL (ref 0.00–0.07)
Basophils Absolute: 0 10*3/uL (ref 0.0–0.1)
Basophils Relative: 0 %
Eosinophils Absolute: 0 10*3/uL (ref 0.0–0.5)
Eosinophils Relative: 0 %
HCT: 25.2 % — ABNORMAL LOW (ref 36.0–46.0)
Hemoglobin: 8.7 g/dL — ABNORMAL LOW (ref 12.0–15.0)
Immature Granulocytes: 0 %
Lymphocytes Relative: 64 %
Lymphs Abs: 1.5 10*3/uL (ref 0.7–4.0)
MCH: 29.7 pg (ref 26.0–34.0)
MCHC: 34.5 g/dL (ref 30.0–36.0)
MCV: 86 fL (ref 80.0–100.0)
Monocytes Absolute: 0.2 10*3/uL (ref 0.1–1.0)
Monocytes Relative: 10 %
Neutro Abs: 0.6 10*3/uL — ABNORMAL LOW (ref 1.7–7.7)
Neutrophils Relative %: 26 %
Platelet Count: 17 10*3/uL — ABNORMAL LOW (ref 150–400)
RBC: 2.93 MIL/uL — ABNORMAL LOW (ref 3.87–5.11)
RDW: 13.4 % (ref 11.5–15.5)
WBC Count: 2.3 10*3/uL — ABNORMAL LOW (ref 4.0–10.5)
nRBC: 0 % (ref 0.0–0.2)

## 2019-04-28 LAB — CYCLOSPORINE: Cyclosporine, LabCorp: 273 ng/mL (ref 100–400)

## 2019-04-28 LAB — SAMPLE TO BLOOD BANK

## 2019-04-28 MED ORDER — HEPARIN SOD (PORK) LOCK FLUSH 100 UNIT/ML IV SOLN
500.0000 [IU] | Freq: Once | INTRAVENOUS | Status: AC
  Administered 2019-04-28: 250 [IU] via INTRAVENOUS
  Filled 2019-04-28: qty 5

## 2019-04-28 NOTE — Telephone Encounter (Signed)
Per Dr. Prince Solian and Dr. Benay Spice: She may D/C the fluconazole, levofloxacin and posacolazole. Notified managed care as well that can cancel the PA on this drug.  [place] to inquire if they can transfuse patient on 10/5 and they had no openings. Notified blood bank to be sure random platelets will be available on 10/5 in case needed.

## 2019-04-28 NOTE — Progress Notes (Signed)
CRITICAL VALUE STICKER  CRITICAL VALUE: ANC 0.6   RECEIVER (on-site recipient of call): Dutch Gray    DATE & TIME NOTIFIED: 04/28/2019 @ 0945 MESSENGER (representative from lab): Reba  MD NOTIFIED: Dr. Benay Spice  TIME OF NOTIFICATION:0955  RESPONSE: Improved--no new orders

## 2019-04-28 NOTE — Progress Notes (Signed)
[place] OFFICE PROGRESS NOTE   Diagnosis: CLL, pancytopenia  INTERVAL HISTORY:   Meagan Walsh returns as scheduled.  No fever.  Mild gum bleeding.  No other bleeding.  She continues cyclosporine.  She reports intermittent nausea.  She also has occasional diarrhea.  Objective:  Vital signs in last 24 hours:  Blood pressure 111/68, pulse 95, temperature 99.1 F (37.3 C), temperature source Temporal, resp. rate 18, height _0  (1.676 m), weight 140 lb 4.8 oz (63.6 kg), SpO2 100 %.    HEENT: No thrush, few small ecchymoses at the left buccal mucosa, no other oral bleeding GI: Nontender, no hepatosplenomegaly Vascular: No leg edema Skin: Resolving ecchymoses over the legs  Portacath/PICC-without erythema  Lab Results:  Lab Results  Component Value Date   WBC 2.3 (L) 04/28/2019   HGB 8.7 (L) 04/28/2019   HCT 25.2 (L) 04/28/2019   MCV 86.0 04/28/2019   PLT 17 (L) 04/28/2019   NEUTROABS 0.6 (L) 04/28/2019    CMP  Lab Results  Component Value Date   NA 138 04/18/2019   K 4.0 04/18/2019   CL 106 04/18/2019   CO2 24 04/18/2019   GLUCOSE 175 (H) 04/18/2019   BUN 22 (H) 04/18/2019   CREATININE 1.10 (H) 04/18/2019   CALCIUM 9.4 04/18/2019   PROT 6.0 (L) 04/18/2019   ALBUMIN 3.7 04/18/2019   AST 40 04/18/2019   ALT 61 (H) 04/18/2019   ALKPHOS 83 04/18/2019   BILITOT 0.6 04/18/2019   GFRNONAA 55 (L) 04/18/2019   GFRAA >60 04/18/2019    No results found for: CEA1  Lab Results  Component Value Date   INR 1.4 (H) 12/04/2018    Imaging:  No results found.  Medications: I have reviewed the patient's current medications.   Assessment/Plan: . CLL-diagnosed in August 2010, flow cytometry consistent with CLL Enlarged leftinguinal lymph node January 2019,smallneck/axillary nodes and palpable splenomegaly 09/11/2017  CTson 09/17/2017-3 cm necrotic appearing lymph node in the left inguinal region, borderline enlarged pelvic/retroperitoneal, chest,  and axillary nodes. Mild splenomegaly.  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, no evidence of 11q22deletion  Bone marrow biopsy 02/26/2018-hypercellular marrow with extensive involvement by CLL, lymphocytes represent85% of all cells  Ibrutinib initiated 04/03/2018  Ibrutinib placed on hold 04/11/2018 due to onset of arthralgias  Ibrutinib resumed 04/16/2018, discontinued 04/25/2018 secondary to severe arthralgias/arthritis  Ibrutinib resumed at a dose of 140 mg daily 05/03/2018  Ibrutinib dose adjusted to 140 mg alternating with 231m 06/25/2018  Ibrutinib discontinued 07/03/2018 secondary to severe arthralgias  Acalabrutinib 08/16/2018, discontinued 11/15/2018 secondary to persistent severe transfusion dependent anemia and neutropenia/thrombocytopenia, last dose 11/14/2018  Bone marrow biopsy 11/21/2018-decreased cellularity, involvement by CLL, decreased erythroid and granulocytic precursors, decreased megakaryocytes  Cycle 1 rituximab 12/06/2018  Bone marrow biopsy 12/24/2018 at [facility]-hypocellular bone marrow (10%) involved by CLL, representing 50% of marrow cellularity; markedly decreased trilineage hematopoiesis including essentially absent erythropoiesis  Bone marrow biopsy 03/06/2027 [facility]- hypocellular bone marrow (20%) with marked reduction of maturing hematopoietic elements; numerous lymphoid aggregates consistent with involvement by CLL,  representing approximately 50% of marrow cellularity  by flow cytometry  ATG/cyclosporine at [facility] beginning 03/19/2019 followed by prednisone taper  2.Hypothyroidism 3.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 Augmentin prescribed. 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 Pancytopenia secondary to CLL and a hypocellular bone marrow  G-CSF and Nplate started 2/82/0813, G-CSF changed to daily beginning 12/17/2018; G-CSF discontinued 12/31/2018, last Nplate 01/15/2019  Began prednisone 60 mg daily 01/11/19, tolerating moderately well except jitteriness, irritability, and difficulty sleeping. Tapered to 40 mg daily x1 week starting 01/24/19, reduce by 10 mg each week until discontinued. Prednisone discontinued 02/20/2019.  Promacta started 01/20/2019, dose increased to 100 mg daily 02/04/2019; dose increased to 150 mg daily 02/21/2019  IVIG daily for 2 days beginning 02/25/2019   9. Severe headache and nausea/vomiting 02/27/2019-likely related to IVIG therapy,improved    Disposition: Meagan Walsh appears unchanged.  She is now almost 6 weeks out from starting the ATG/cyclosporine.  The neutrophil count has improved over the past few weeks.  She has also been less transfusion dependent for red cells and platelets. She will not receive transfusions today.  She will return for a CBC on 04/30/2019 and 05/02/2019.  I contacted Dr. Zara Council.  The plan is to continue cyclosporine.  She will continue Bactrim and acyclovir prophylaxis.  Levaquin and antifungal prophylaxis will be discontinued.  Meagan Walsh will be  scheduled for a 1 week office visit.  Meagan Coder, MD  04/28/2019  9:47 AM

## 2019-04-29 ENCOUNTER — Telehealth: Payer: Self-pay | Admitting: *Deleted

## 2019-04-29 ENCOUNTER — Inpatient Hospital Stay: Payer: BC Managed Care – PPO

## 2019-04-29 ENCOUNTER — Other Ambulatory Visit: Payer: Self-pay | Admitting: *Deleted

## 2019-04-29 ENCOUNTER — Encounter: Payer: Self-pay | Admitting: *Deleted

## 2019-04-29 ENCOUNTER — Other Ambulatory Visit: Payer: Self-pay

## 2019-04-29 ENCOUNTER — Telehealth: Payer: Self-pay | Admitting: Oncology

## 2019-04-29 DIAGNOSIS — D696 Thrombocytopenia, unspecified: Secondary | ICD-10-CM

## 2019-04-29 DIAGNOSIS — R0600 Dyspnea, unspecified: Secondary | ICD-10-CM | POA: Diagnosis not present

## 2019-04-29 DIAGNOSIS — C9110 Chronic lymphocytic leukemia of B-cell type not having achieved remission: Secondary | ICD-10-CM

## 2019-04-29 DIAGNOSIS — R748 Abnormal levels of other serum enzymes: Secondary | ICD-10-CM | POA: Diagnosis not present

## 2019-04-29 DIAGNOSIS — Z95828 Presence of other vascular implants and grafts: Secondary | ICD-10-CM

## 2019-04-29 DIAGNOSIS — B1910 Unspecified viral hepatitis B without hepatic coma: Secondary | ICD-10-CM | POA: Diagnosis not present

## 2019-04-29 DIAGNOSIS — D801 Nonfamilial hypogammaglobulinemia: Secondary | ICD-10-CM | POA: Diagnosis not present

## 2019-04-29 DIAGNOSIS — R7989 Other specified abnormal findings of blood chemistry: Secondary | ICD-10-CM | POA: Diagnosis not present

## 2019-04-29 DIAGNOSIS — D61818 Other pancytopenia: Secondary | ICD-10-CM | POA: Diagnosis not present

## 2019-04-29 DIAGNOSIS — Z8619 Personal history of other infectious and parasitic diseases: Secondary | ICD-10-CM | POA: Diagnosis not present

## 2019-04-29 DIAGNOSIS — Z79899 Other long term (current) drug therapy: Secondary | ICD-10-CM | POA: Diagnosis not present

## 2019-04-29 DIAGNOSIS — E039 Hypothyroidism, unspecified: Secondary | ICD-10-CM | POA: Diagnosis not present

## 2019-04-29 DIAGNOSIS — K068 Other specified disorders of gingiva and edentulous alveolar ridge: Secondary | ICD-10-CM | POA: Diagnosis not present

## 2019-04-29 LAB — CBC WITH DIFFERENTIAL (CANCER CENTER ONLY)
Abs Immature Granulocytes: 0.01 10*3/uL (ref 0.00–0.07)
Basophils Absolute: 0 10*3/uL (ref 0.0–0.1)
Basophils Relative: 0 %
Eosinophils Absolute: 0 10*3/uL (ref 0.0–0.5)
Eosinophils Relative: 0 %
HCT: 25.7 % — ABNORMAL LOW (ref 36.0–46.0)
Hemoglobin: 8.7 g/dL — ABNORMAL LOW (ref 12.0–15.0)
Immature Granulocytes: 0 %
Lymphocytes Relative: 55 %
Lymphs Abs: 1.4 10*3/uL (ref 0.7–4.0)
MCH: 29.3 pg (ref 26.0–34.0)
MCHC: 33.9 g/dL (ref 30.0–36.0)
MCV: 86.5 fL (ref 80.0–100.0)
Monocytes Absolute: 0.3 10*3/uL (ref 0.1–1.0)
Monocytes Relative: 12 %
Neutro Abs: 0.8 10*3/uL — ABNORMAL LOW (ref 1.7–7.7)
Neutrophils Relative %: 33 %
Platelet Count: 20 10*3/uL — ABNORMAL LOW (ref 150–400)
RBC: 2.97 MIL/uL — ABNORMAL LOW (ref 3.87–5.11)
RDW: 13.2 % (ref 11.5–15.5)
WBC Count: 2.5 10*3/uL — ABNORMAL LOW (ref 4.0–10.5)
nRBC: 0 % (ref 0.0–0.2)

## 2019-04-29 LAB — CMP (CANCER CENTER ONLY)
ALT: 42 U/L (ref 0–44)
AST: 17 U/L (ref 15–41)
Albumin: 3.9 g/dL (ref 3.5–5.0)
Alkaline Phosphatase: 106 U/L (ref 38–126)
Anion gap: 7 (ref 5–15)
BUN: 25 mg/dL — ABNORMAL HIGH (ref 6–20)
CO2: 25 mmol/L (ref 22–32)
Calcium: 9.7 mg/dL (ref 8.9–10.3)
Chloride: 105 mmol/L (ref 98–111)
Creatinine: 1.21 mg/dL — ABNORMAL HIGH (ref 0.44–1.00)
GFR, Est AFR Am: 57 mL/min — ABNORMAL LOW (ref >60)
GFR, Estimated: 49 mL/min — ABNORMAL LOW (ref >60)
Glucose, Bld: 118 mg/dL — ABNORMAL HIGH (ref 70–99)
Potassium: 4.4 mmol/L (ref 3.5–5.1)
Sodium: 137 mmol/L (ref 135–145)
Total Bilirubin: 0.8 mg/dL (ref 0.3–1.2)
Total Protein: 6.1 g/dL — ABNORMAL LOW (ref 6.5–8.1)

## 2019-04-29 LAB — SAMPLE TO BLOOD BANK

## 2019-04-29 MED ORDER — SODIUM CHLORIDE 0.9% FLUSH
10.0000 mL | INTRAVENOUS | Status: DC | PRN
  Administered 2019-04-29: 10 mL
  Filled 2019-04-29: qty 10

## 2019-04-29 MED ORDER — CYCLOSPORINE MODIFIED (NEORAL) 100 MG/ML PO SOLN: 175.0000 mg | Freq: Two times a day (BID) | ORAL | 0 refills | Status: DC

## 2019-04-29 MED ORDER — HEPARIN SOD (PORK) LOCK FLUSH 100 UNIT/ML IV SOLN
500.0000 [IU] | Freq: Once | INTRAVENOUS | Status: DC
  Filled 2019-04-29: qty 5

## 2019-04-29 MED ORDER — HEPARIN SOD (PORK) LOCK FLUSH 100 UNIT/ML IV SOLN
500.0000 [IU] | Freq: Once | INTRAVENOUS | Status: AC
  Administered 2019-04-29: 250 [IU] via INTRAVENOUS
  Filled 2019-04-29: qty 5

## 2019-04-29 NOTE — Progress Notes (Signed)
Cyclosporin level drawn on 10/02 received and in normal range. Report faxed to [facility] pharmacist, Joellen Jersey, PharmD. At [phone]

## 2019-04-29 NOTE — Telephone Encounter (Signed)
Scheduled per 10/05 los, patient will receive calender when she comes in for her next appointment.

## 2019-04-29 NOTE — Addendum Note (Signed)
Addended by: Tania Ade on: 04/29/2019 02:41 PM   Modules accepted: Orders, SmartSet

## 2019-04-29 NOTE — Telephone Encounter (Addendum)
Called to report gums started bleeding last night at 7pm and continue despite Amicar pills and rinses. Per Dr. Benay Spice: Needs CBC and 1 unit platelets today. High priority scheduling message sent.  Informed her to come today at 1:15 pm for labs/platelets.

## 2019-05-01 ENCOUNTER — Other Ambulatory Visit: Payer: Self-pay | Admitting: *Deleted

## 2019-05-01 DIAGNOSIS — C9110 Chronic lymphocytic leukemia of B-cell type not having achieved remission: Secondary | ICD-10-CM

## 2019-05-02 ENCOUNTER — Inpatient Hospital Stay: Payer: BC Managed Care – PPO

## 2019-05-02 ENCOUNTER — Other Ambulatory Visit: Payer: Self-pay

## 2019-05-02 ENCOUNTER — Other Ambulatory Visit: Payer: Self-pay | Admitting: *Deleted

## 2019-05-02 DIAGNOSIS — R0600 Dyspnea, unspecified: Secondary | ICD-10-CM | POA: Diagnosis not present

## 2019-05-02 DIAGNOSIS — R7989 Other specified abnormal findings of blood chemistry: Secondary | ICD-10-CM | POA: Diagnosis not present

## 2019-05-02 DIAGNOSIS — C9110 Chronic lymphocytic leukemia of B-cell type not having achieved remission: Secondary | ICD-10-CM | POA: Diagnosis not present

## 2019-05-02 DIAGNOSIS — B1910 Unspecified viral hepatitis B without hepatic coma: Secondary | ICD-10-CM | POA: Diagnosis not present

## 2019-05-02 DIAGNOSIS — Z8619 Personal history of other infectious and parasitic diseases: Secondary | ICD-10-CM | POA: Diagnosis not present

## 2019-05-02 DIAGNOSIS — K068 Other specified disorders of gingiva and edentulous alveolar ridge: Secondary | ICD-10-CM | POA: Diagnosis not present

## 2019-05-02 DIAGNOSIS — D696 Thrombocytopenia, unspecified: Secondary | ICD-10-CM

## 2019-05-02 DIAGNOSIS — R748 Abnormal levels of other serum enzymes: Secondary | ICD-10-CM | POA: Diagnosis not present

## 2019-05-02 DIAGNOSIS — D801 Nonfamilial hypogammaglobulinemia: Secondary | ICD-10-CM | POA: Diagnosis not present

## 2019-05-02 DIAGNOSIS — Z95828 Presence of other vascular implants and grafts: Secondary | ICD-10-CM

## 2019-05-02 DIAGNOSIS — E039 Hypothyroidism, unspecified: Secondary | ICD-10-CM | POA: Diagnosis not present

## 2019-05-02 DIAGNOSIS — D61818 Other pancytopenia: Secondary | ICD-10-CM | POA: Diagnosis not present

## 2019-05-02 DIAGNOSIS — Z79899 Other long term (current) drug therapy: Secondary | ICD-10-CM | POA: Diagnosis not present

## 2019-05-02 DIAGNOSIS — D649 Anemia, unspecified: Secondary | ICD-10-CM

## 2019-05-02 LAB — CBC WITH DIFFERENTIAL (CANCER CENTER ONLY)
Abs Immature Granulocytes: 0.02 10*3/uL (ref 0.00–0.07)
Basophils Absolute: 0 10*3/uL (ref 0.0–0.1)
Basophils Relative: 0 %
Eosinophils Absolute: 0 10*3/uL (ref 0.0–0.5)
Eosinophils Relative: 0 %
HCT: 21.8 % — ABNORMAL LOW (ref 36.0–46.0)
Hemoglobin: 7.4 g/dL — ABNORMAL LOW (ref 12.0–15.0)
Immature Granulocytes: 1 %
Lymphocytes Relative: 51 %
Lymphs Abs: 1.3 10*3/uL (ref 0.7–4.0)
MCH: 29.4 pg (ref 26.0–34.0)
MCHC: 33.9 g/dL (ref 30.0–36.0)
MCV: 86.5 fL (ref 80.0–100.0)
Monocytes Absolute: 0.4 10*3/uL (ref 0.1–1.0)
Monocytes Relative: 14 %
Neutro Abs: 0.9 10*3/uL — ABNORMAL LOW (ref 1.7–7.7)
Neutrophils Relative %: 34 %
Platelet Count: 18 10*3/uL — ABNORMAL LOW (ref 150–400)
RBC: 2.52 MIL/uL — ABNORMAL LOW (ref 3.87–5.11)
RDW: 13.1 % (ref 11.5–15.5)
WBC Count: 2.6 10*3/uL — ABNORMAL LOW (ref 4.0–10.5)
nRBC: 0 % (ref 0.0–0.2)

## 2019-05-02 LAB — CMP (CANCER CENTER ONLY)
ALT: 36 U/L (ref 0–44)
AST: 25 U/L (ref 15–41)
Albumin: 3.7 g/dL (ref 3.5–5.0)
Alkaline Phosphatase: 109 U/L (ref 38–126)
Anion gap: 5 (ref 5–15)
BUN: 19 mg/dL (ref 6–20)
CO2: 26 mmol/L (ref 22–32)
Calcium: 9.4 mg/dL (ref 8.9–10.3)
Chloride: 108 mmol/L (ref 98–111)
Creatinine: 0.76 mg/dL (ref 0.44–1.00)
GFR, Est AFR Am: >60 mL/min (ref >60)
GFR, Estimated: >60 mL/min (ref >60)
Glucose, Bld: 107 mg/dL — ABNORMAL HIGH (ref 70–99)
Potassium: 4.3 mmol/L (ref 3.5–5.1)
Sodium: 139 mmol/L (ref 135–145)
Total Bilirubin: 0.5 mg/dL (ref 0.3–1.2)
Total Protein: 5.8 g/dL — ABNORMAL LOW (ref 6.5–8.1)

## 2019-05-02 LAB — SAMPLE TO BLOOD BANK

## 2019-05-02 MED ORDER — HEPARIN SOD (PORK) LOCK FLUSH 100 UNIT/ML IV SOLN
250.0000 [IU] | INTRAVENOUS | Status: AC | PRN
  Administered 2019-05-02: 250 [IU]
  Filled 2019-05-02: qty 5

## 2019-05-02 MED ORDER — SODIUM CHLORIDE 0.9% IV SOLUTION
250.0000 mL | Freq: Once | INTRAVENOUS | Status: AC
  Administered 2019-05-02: 250 mL via INTRAVENOUS
  Filled 2019-05-02: qty 250

## 2019-05-02 MED ORDER — SODIUM CHLORIDE 0.9% FLUSH
10.0000 mL | INTRAVENOUS | Status: DC | PRN
  Administered 2019-05-02: 10 mL
  Filled 2019-05-02: qty 10

## 2019-05-02 MED ORDER — HEPARIN SOD (PORK) LOCK FLUSH 100 UNIT/ML IV SOLN
500.0000 [IU] | Freq: Once | INTRAVENOUS | Status: AC | PRN
  Administered 2019-05-02: 13:00:00 250 [IU]
  Filled 2019-05-02: qty 5

## 2019-05-02 MED ORDER — SODIUM CHLORIDE 0.9% FLUSH
10.0000 mL | INTRAVENOUS | Status: AC | PRN
  Administered 2019-05-02: 10 mL
  Filled 2019-05-02: qty 10

## 2019-05-02 MED FILL — CYCLOSPORINE 100 MG/ML SOLN: 100 | 27 days supply | Qty: 100 | Fill #0

## 2019-05-02 NOTE — Patient Instructions (Signed)
Blood Transfusion, Adult, Care After This sheet gives you information about how to care for yourself after your procedure. Your doctor may also give you more specific instructions. If you have problems or questions, contact your doctor. Follow these instructions at home:   Take over-the-counter and prescription medicines only as told by your doctor.  Go back to your normal activities as told by your doctor.  Follow instructions from your doctor about how to take care of the area where an IV tube was put into your vein (insertion site). Make sure you: ? Wash your hands with soap and water before you change your bandage (dressing). If there is no soap and water, use hand sanitizer. ? Change your bandage as told by your doctor.  Check your IV insertion site every day for signs of infection. Check for: ? More redness, swelling, or pain. ? More fluid or blood. ? Warmth. ? Pus or a bad smell. Contact a doctor if:  You have more redness, swelling, or pain around the IV insertion site.  You have more fluid or blood coming from the IV insertion site.  Your IV insertion site feels warm to the touch.  You have pus or a bad smell coming from the IV insertion site.  Your pee (urine) turns pink, red, or brown.  You feel weak after doing your normal activities. Get help right away if:  You have signs of a serious allergic or body defense (immune) system reaction, including: ? Itchiness. ? Hives. ? Trouble breathing. ? Anxiety. ? Pain in your chest or lower back. ? Fever, flushing, and chills. ? Fast pulse. ? Rash. ? Watery poop (diarrhea). ? Throwing up (vomiting). ? Dark pee. ? Serious headache. ? Dizziness. ? Stiff neck. ? Yellow color in your face or the white parts of your eyes (jaundice). Summary  After a blood transfusion, return to your normal activities as told by your doctor.  Every day, check for signs of infection where the IV tube was put into your vein.  Some  signs of infection are warm skin, more redness and pain, more fluid or blood, and pus or a bad smell where the needle went in.  Contact your doctor if you feel weak or have any unusual symptoms. This information is not intended to replace advice given to you by your health care provider. Make sure you discuss any questions you have with your health care provider. Document Released: 07/31/2014 Document Revised: 11/14/2017 Document Reviewed: 03/03/2016 Elsevier Patient Education  2020 Elsevier Inc.    Platelet Transfusion A platelet transfusion is a procedure in which you receive donated platelets through an IV. Platelets are tiny pieces of blood cells. When you get an injury, platelets clump together in the area to form a blood clot. This helps stop bleeding and is the beginning of the healing process. If you have too few platelets, your blood may have trouble clotting. This may cause you to bleed and bruise very easily. You may need a platelet transfusion if you have a condition that causes a low number of platelets (thrombocytopenia). A platelet transfusion may be used to stop or prevent excessive bleeding. Tell a health care provider about:  Any reactions you have had during previous transfusions.  Any allergies you have.  All medicines you are taking, including vitamins, herbs, eye drops, creams, and over-the-counter medicines.  Any blood disorders you have.  Any surgeries you have had.  Any medical conditions you have.  Whether you are pregnant or may   be pregnant. What are the risks? Generally, this is a safe procedure. However, problems may occur, including:  Fever.  Infection.  Allergic reaction to the donor platelets.  Your body's disease-fighting system (immune system) attacking the donor platelets (hemolytic reaction). This is rare.  A rare reaction that causes lung damage (transfusion-related acute lung injury). What happens before the procedure? Medicines  Ask  your health care provider about: ? Changing or stopping your regular medicines. This is especially important if you are taking diabetes medicines or blood thinners. ? Taking medicines such as aspirin and ibuprofen. These medicines can thin your blood. Do not take these medicines unless your health care provider tells you to take them. ? Taking over-the-counter medicines, vitamins, herbs, and supplements. General instructions  You will have a blood test to determine your blood type. Your blood type determines what kind of platelets you will be given.  Follow instructions from your health care provider about eating or drinking restrictions.  If you have had an allergic reaction to a transfusion in the past, you may be given medicine to help prevent a reaction.  Your temperature, blood pressure, pulse, and breathing will be monitored. What happens during the procedure?   An IV will be inserted into one of your veins.  For your safety, two health care providers will verify your identity along with the donor platelets about to be infused.  A bag of donor platelets will be connected to your IV. The platelets will flow into your bloodstream. This usually takes 30-60 minutes.  Your temperature, blood pressure, pulse, and breathing will be monitored during the transfusion. This helps detect early signs of any reaction.  You will also be monitored for other symptoms that may indicate a reaction, including chills, hives, or itching.  If you have signs of a reaction at any time, your transfusion will be stopped, and you may be given medicine to help manage the reaction.  When your transfusion is complete, your IV will be removed.  Pressure may be applied to the IV site for a few minutes to stop any bleeding.  The IV site will be covered with a bandage (dressing). The procedure may vary among health care providers and hospitals. What happens after the procedure?  Your blood pressure,  temperature, pulse, and breathing will be monitored until you leave the hospital or clinic.  You may have some bruising and soreness at your IV site. Follow these instructions at home: Medicines  Take over-the-counter and prescription medicines only as told by your health care provider.  Talk with your health care provider before you take any medicines that contain aspirin or NSAIDs. These medicines increase your risk for dangerous bleeding. General instructions  Change or remove your dressing as told by your health care provider.  Return to your normal activities as told by your health care provider. Ask your health care provider what activities are safe for you.  Do not take baths, swim, or use a hot tub until your health care provider approves. Ask your health care provider if you may take showers.  Check your IV site every day for signs of infection. Check for: ? Redness, swelling, or pain. ? Fluid or blood. If fluid or blood drains from your IV site, use your hands to press down firmly on a bandage covering the area for a minute or two. Doing this should stop the bleeding. ? Warmth. ? Pus or a bad smell.  Keep all follow-up visits as told by your health   care provider. This is important. Contact a health care provider if you have:  A headache that does not go away with medicine.  Hives, rash, or itchy skin.  Nausea or vomiting.  Unusual tiredness or weakness.  Signs of infection at your IV site. Get help right away if:  You have a fever or chills.  You urinate less often than usual.  Your urine is darker colored than normal.  You have any of the following: ? Trouble breathing. ? Pain in your back, abdomen, or chest. ? Cool, clammy skin. ? A fast heartbeat. Summary  Platelets are tiny pieces of blood cells that clump together to form a blood clot when you have an injury. If you have too few platelets, your blood may have trouble clotting.  A platelet transfusion  is a procedure in which you receive donated platelets through an IV.  A platelet transfusion may be used to stop or prevent excessive bleeding.  After the procedure, check your IV site every day for signs of infection, including redness, swelling, pain, or warmth. This information is not intended to replace advice given to you by your health care provider. Make sure you discuss any questions you have with your health care provider. Document Released: 05/07/2007 Document Revised: 08/15/2017 Document Reviewed: 08/15/2017 Elsevier Patient Education  2020 Elsevier Inc.  

## 2019-05-02 NOTE — Progress Notes (Signed)
Per Dr. Benay Spice: Transfuse 1 unit blood today and hold off on platelets (stable) unless she is bleeding. Called blood bank to make them aware of plan. Are working on [place] now. Patient called earlier today to report she had run out of the cyclosporin liquid, so only took 100 mg bid on Wednesday and Thursday. Pharmacy has her liquid ready for today.

## 2019-05-04 LAB — CYCLOSPORINE: Cyclosporine, LabCorp: 115 ng/mL (ref 100–400)

## 2019-05-05 ENCOUNTER — Inpatient Hospital Stay: Payer: BC Managed Care – PPO

## 2019-05-05 ENCOUNTER — Encounter: Payer: Self-pay | Admitting: *Deleted

## 2019-05-05 ENCOUNTER — Encounter: Payer: Self-pay | Admitting: Nurse Practitioner

## 2019-05-05 ENCOUNTER — Other Ambulatory Visit: Payer: Self-pay

## 2019-05-05 ENCOUNTER — Telehealth: Payer: Self-pay | Admitting: Oncology

## 2019-05-05 ENCOUNTER — Inpatient Hospital Stay (HOSPITAL_BASED_OUTPATIENT_CLINIC_OR_DEPARTMENT_OTHER): Payer: BC Managed Care – PPO | Admitting: Nurse Practitioner

## 2019-05-05 VITALS — BP 122/68 | HR 103 | Temp 98.7°F | Resp 18 | Ht 66.0 in | Wt 139.8 lb

## 2019-05-05 DIAGNOSIS — D61818 Other pancytopenia: Secondary | ICD-10-CM | POA: Diagnosis not present

## 2019-05-05 DIAGNOSIS — D801 Nonfamilial hypogammaglobulinemia: Secondary | ICD-10-CM | POA: Diagnosis not present

## 2019-05-05 DIAGNOSIS — D696 Thrombocytopenia, unspecified: Secondary | ICD-10-CM | POA: Diagnosis not present

## 2019-05-05 DIAGNOSIS — E039 Hypothyroidism, unspecified: Secondary | ICD-10-CM | POA: Diagnosis not present

## 2019-05-05 DIAGNOSIS — C9110 Chronic lymphocytic leukemia of B-cell type not having achieved remission: Secondary | ICD-10-CM | POA: Diagnosis not present

## 2019-05-05 DIAGNOSIS — B1910 Unspecified viral hepatitis B without hepatic coma: Secondary | ICD-10-CM | POA: Diagnosis not present

## 2019-05-05 DIAGNOSIS — Z79899 Other long term (current) drug therapy: Secondary | ICD-10-CM | POA: Diagnosis not present

## 2019-05-05 DIAGNOSIS — R0600 Dyspnea, unspecified: Secondary | ICD-10-CM | POA: Diagnosis not present

## 2019-05-05 DIAGNOSIS — R7989 Other specified abnormal findings of blood chemistry: Secondary | ICD-10-CM | POA: Diagnosis not present

## 2019-05-05 DIAGNOSIS — Z8619 Personal history of other infectious and parasitic diseases: Secondary | ICD-10-CM | POA: Diagnosis not present

## 2019-05-05 DIAGNOSIS — R748 Abnormal levels of other serum enzymes: Secondary | ICD-10-CM | POA: Diagnosis not present

## 2019-05-05 DIAGNOSIS — Z95828 Presence of other vascular implants and grafts: Secondary | ICD-10-CM

## 2019-05-05 DIAGNOSIS — K068 Other specified disorders of gingiva and edentulous alveolar ridge: Secondary | ICD-10-CM | POA: Diagnosis not present

## 2019-05-05 DIAGNOSIS — D619 Aplastic anemia, unspecified: Principal | ICD-10-CM

## 2019-05-05 LAB — CBC WITH DIFFERENTIAL (CANCER CENTER ONLY)
Abs Immature Granulocytes: 0.01 10*3/uL (ref 0.00–0.07)
Basophils Absolute: 0 10*3/uL (ref 0.0–0.1)
Basophils Relative: 0 %
Eosinophils Absolute: 0 10*3/uL (ref 0.0–0.5)
Eosinophils Relative: 0 %
HCT: 26.4 % — ABNORMAL LOW (ref 36.0–46.0)
Hemoglobin: 9 g/dL — ABNORMAL LOW (ref 12.0–15.0)
Immature Granulocytes: 1 %
Lymphocytes Relative: 40 %
Lymphs Abs: 0.8 10*3/uL (ref 0.7–4.0)
MCH: 29.5 pg (ref 26.0–34.0)
MCHC: 34.1 g/dL (ref 30.0–36.0)
MCV: 86.6 fL (ref 80.0–100.0)
Monocytes Absolute: 0.2 10*3/uL (ref 0.1–1.0)
Monocytes Relative: 11 %
Neutro Abs: 1 10*3/uL — ABNORMAL LOW (ref 1.7–7.7)
Neutrophils Relative %: 48 %
Platelet Count: 20 10*3/uL — ABNORMAL LOW (ref 150–400)
RBC: 3.05 MIL/uL — ABNORMAL LOW (ref 3.87–5.11)
RDW: 13.3 % (ref 11.5–15.5)
WBC Count: 2 10*3/uL — ABNORMAL LOW (ref 4.0–10.5)
nRBC: 0 % (ref 0.0–0.2)

## 2019-05-05 LAB — COMPREHENSIVE METABOLIC PANEL
ALT: 58 U/L — ABNORMAL HIGH (ref 0–44)
AST: 29 U/L (ref 15–41)
Albumin: 3.8 g/dL (ref 3.5–5.0)
Alkaline Phosphatase: 133 U/L — ABNORMAL HIGH (ref 38–126)
Anion gap: 8 (ref 5–15)
BUN: 14 mg/dL (ref 6–20)
CO2: 27 mmol/L (ref 22–32)
Calcium: 9.7 mg/dL (ref 8.9–10.3)
Chloride: 104 mmol/L (ref 98–111)
Creatinine, Ser: 0.97 mg/dL (ref 0.44–1.00)
GFR calc Af Amer: >60 mL/min (ref >60)
GFR calc non Af Amer: >60 mL/min (ref >60)
Glucose, Bld: 126 mg/dL — ABNORMAL HIGH (ref 70–99)
Potassium: 4.3 mmol/L (ref 3.5–5.1)
Sodium: 139 mmol/L (ref 135–145)
Total Bilirubin: 0.6 mg/dL (ref 0.3–1.2)
Total Protein: 6 g/dL — ABNORMAL LOW (ref 6.5–8.1)

## 2019-05-05 LAB — BPAM RBC
Blood Product Expiration Date: 202011062359
ISSUE DATE / TIME: 202010091419
Unit Type and Rh: 5100

## 2019-05-05 LAB — TYPE AND SCREEN
ABO/RH(D): O POS
Antibody Screen: NEGATIVE
Unit division: 0

## 2019-05-05 LAB — SAMPLE TO BLOOD BANK

## 2019-05-05 MED ORDER — HEPARIN SOD (PORK) LOCK FLUSH 100 UNIT/ML IV SOLN
500.0000 [IU] | Freq: Once | INTRAVENOUS | Status: AC | PRN
  Administered 2019-05-05: 250 [IU]
  Filled 2019-05-05: qty 5

## 2019-05-05 MED ORDER — SODIUM CHLORIDE 0.9% FLUSH
10.0000 mL | INTRAVENOUS | Status: DC | PRN
  Administered 2019-05-05: 10 mL
  Filled 2019-05-05: qty 10

## 2019-05-05 NOTE — Progress Notes (Signed)
Faxed cyclosporin results from 05/02/19 draw to [place]: Joellen Jersey, PharmD [phone]. Noted that patient had run out of medication and missed 10/7 and 10/8 of full dosing. Resumed full dosing on 05/02/19 after lab drawn.

## 2019-05-05 NOTE — Telephone Encounter (Signed)
Scheduled appt per 10/12 los - pt aware of appts - per pt no printout needed - my chart active.

## 2019-05-05 NOTE — Progress Notes (Addendum)
[place] OFFICE PROGRESS NOTE   Diagnosis: CLL, pancytopenia  INTERVAL HISTORY:   Ms. Newville returns as scheduled.  She was transfused a unit of blood 05/02/2019.  Overall she feels better.  She notes mild intermittent gum bleeding.  No shortness of breath or cough.  No fever.  She continues to have nausea.  Objective:  Vital signs in last 24 hours:  Blood pressure 122/68, pulse (!) 103, temperature 98.7 F (37.1 C), temperature source Temporal, resp. rate 18, height '5\' 6"'  (1.676 m), weight 139 lb 12.8 oz (63.4 kg), SpO2 100 %.    HEENT: No thrush.  Gums are erythematous with scattered areas of mild hypertrophy.  No bleeding noted. GI: Abdomen soft and nontender.  No hepatosplenomegaly. Vascular: No leg edema. Neuro: Alert and oriented. Skin: Ecchymoses scattered over extremities, various stages of resolution. Left upper extremity PICC site without erythema.   Lab Results:  Lab Results  Component Value Date   WBC 2.0 (L) 05/05/2019   HGB 9.0 (L) 05/05/2019   HCT 26.4 (L) 05/05/2019   MCV 86.6 05/05/2019   PLT 20 (L) 05/05/2019   NEUTROABS 1.0 (L) 05/05/2019    Imaging:  No results found.  Medications: I have reviewed the patient's current medications.  Assessment/Plan: 1. 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 Mild splenomegaly.  Ultrasound-guided biopsy of the left inguinal lymph node 09/18/2017, slightly "purulent "fluid aspirated, core biopsy is consistent with an atypical lymphoid proliferation-extensive necrosis with surrounding epithelioid histiocytes, limited intact lymphoid tissue involved with CLL  Incisional biopsy of a necrotic/purulent left inguinal  lymph node on 10/01/2017-extensive necrosis with granulomatous inflammation, small amount of viable lymphoid tissue involved with CLL, AFB and fungal stains negative  Peripheral blood FISH analysis 02/05/2018-deletion 13q14, no evidence of p53 (17p13) deletion, no evidence of 11q22deletion  Bone marrow biopsy 02/26/2018-hypercellular marrow with extensive involvement by CLL, lymphocytes represent85% of all cells  Ibrutinib initiated 04/03/2018  Ibrutinib placed on hold 04/11/2018 due to onset of arthralgias  Ibrutinib resumed 04/16/2018, discontinued 04/25/2018 secondary to severe arthralgias/arthritis  Ibrutinib resumed at a dose of 140 mg daily 05/03/2018  Ibrutinib dose adjusted to 140 mg alternating with 24m 06/25/2018  Ibrutinib discontinued 07/03/2018 secondary to severe arthralgias  Acalabrutinib 08/16/2018, discontinued 11/15/2018 secondary to persistent severe transfusion dependent anemia and neutropenia/thrombocytopenia, last dose 11/14/2018  Bone marrow biopsy 11/21/2018-decreased cellularity, involvement by CLL, decreased erythroid and granulocytic precursors, decreased megakaryocytes  Cycle 1 rituximab 12/06/2018  Bone marrow biopsy 12/24/2018 at [facility]-hypocellular bone marrow (10%) involved by CLL, representing 50% of marrow cellularity; markedly decreased trilineage hematopoiesis including essentially absent erythropoiesis  Bone marrow biopsy 03/06/2027 [facility]-hypocellular bone marrow (20%) with marked reduction of maturing hematopoietic elements; numerous lymphoid aggregates consistent with involvement by CLL, representing approximately 50% of marrow cellularity by flow cytometry  [facility] followed by prednisone taper  2.Hypothyroidism 3.Hepatitis B surface and core antibody positive  Hepatitis B surface antigen negative and hepatitis B core antibody -12/02/2018 4.Left lung pneumonia diagnosed 10/08/2017-completed 7 days of Levaquin 5.Left  lung pneumoniaon chest x-ray 12/27/2017. Augmentin prescribed. 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 Pancytopenia secondary to CLL and a hypocellular bone marrow  G-CSF and  Nplate started 3/72/9021, G-CSF changed to daily beginning 12/17/2018; G-CSF discontinued 12/31/2018, last Nplate 01/15/2019  Began prednisone 60 mg daily 01/11/19, tolerating moderately well except jitteriness, irritability, and difficulty sleeping. Tapered to 40 mg daily x1 week starting 01/24/19, reduce by 10 mg each week until discontinued. Prednisone discontinued 02/20/2019.  Promacta started 01/20/2019, dose increased to 100 mg daily 02/04/2019; dose increased to 150 mg daily 02/21/2019  IVIG daily for 2 days beginning 02/25/2019   9. Severe headache and nausea/vomiting 02/27/2019-likely related to IVIG therapy,improved   Disposition: Ms. Camilo appears stable.  We reviewed the CBC from today.  Hemoglobin is better following the recent red cell transfusion; platelet count and absolute neutrophil count stable.  She will return for a repeat CBC on 05/09/2019.  She will return for lab and follow-up in 1 week.  She will contact the office in the interim with any problems.  Patient seen with Dr. Benay Spice.    Betsy Coder ANP/GNP-BC   05/05/2019  6:17 PM  This was a shared visit with Lattie Haw Ms. King appears unchanged.  The neutrophil count partially over the past few weeks and the platelets are stable.  There is no indication for transfusions today.  She will continue cyclosporine.  She is scheduled for a telephone visit with [facility] tomorrow.  [name], MD

## 2019-05-06 ENCOUNTER — Encounter
Admit: 2019-05-06 | Discharge: 2019-05-06 | Payer: PRIVATE HEALTH INSURANCE | Attending: Hematology & Oncology | Primary: Hematology & Oncology

## 2019-05-06 DIAGNOSIS — K068 Other specified disorders of gingiva and edentulous alveolar ridge: Secondary | ICD-10-CM | POA: Diagnosis not present

## 2019-05-06 DIAGNOSIS — F329 Major depressive disorder, single episode, unspecified: Secondary | ICD-10-CM | POA: Diagnosis not present

## 2019-05-06 DIAGNOSIS — D61818 Other pancytopenia: Secondary | ICD-10-CM | POA: Diagnosis not present

## 2019-05-06 DIAGNOSIS — Z8701 Personal history of pneumonia (recurrent): Secondary | ICD-10-CM | POA: Diagnosis not present

## 2019-05-06 DIAGNOSIS — F419 Anxiety disorder, unspecified: Secondary | ICD-10-CM | POA: Diagnosis not present

## 2019-05-06 DIAGNOSIS — Z792 Long term (current) use of antibiotics: Secondary | ICD-10-CM | POA: Diagnosis not present

## 2019-05-06 DIAGNOSIS — Z79899 Other long term (current) drug therapy: Secondary | ICD-10-CM | POA: Diagnosis not present

## 2019-05-06 DIAGNOSIS — D619 Aplastic anemia, unspecified: Secondary | ICD-10-CM | POA: Diagnosis not present

## 2019-05-06 DIAGNOSIS — Z9221 Personal history of antineoplastic chemotherapy: Secondary | ICD-10-CM | POA: Diagnosis not present

## 2019-05-06 DIAGNOSIS — E039 Hypothyroidism, unspecified: Secondary | ICD-10-CM | POA: Diagnosis not present

## 2019-05-06 DIAGNOSIS — R110 Nausea: Secondary | ICD-10-CM | POA: Diagnosis not present

## 2019-05-06 DIAGNOSIS — D696 Thrombocytopenia, unspecified: Secondary | ICD-10-CM | POA: Diagnosis not present

## 2019-05-06 DIAGNOSIS — Z79891 Long term (current) use of opiate analgesic: Secondary | ICD-10-CM | POA: Diagnosis not present

## 2019-05-06 DIAGNOSIS — C9110 Chronic lymphocytic leukemia of B-cell type not having achieved remission: Secondary | ICD-10-CM | POA: Diagnosis not present

## 2019-05-09 ENCOUNTER — Inpatient Hospital Stay: Payer: BC Managed Care – PPO

## 2019-05-09 ENCOUNTER — Other Ambulatory Visit: Payer: Self-pay

## 2019-05-09 ENCOUNTER — Other Ambulatory Visit: Payer: Self-pay | Admitting: Nurse Practitioner

## 2019-05-09 DIAGNOSIS — B1910 Unspecified viral hepatitis B without hepatic coma: Secondary | ICD-10-CM | POA: Diagnosis not present

## 2019-05-09 DIAGNOSIS — D801 Nonfamilial hypogammaglobulinemia: Secondary | ICD-10-CM | POA: Diagnosis not present

## 2019-05-09 DIAGNOSIS — R0600 Dyspnea, unspecified: Secondary | ICD-10-CM | POA: Diagnosis not present

## 2019-05-09 DIAGNOSIS — R748 Abnormal levels of other serum enzymes: Secondary | ICD-10-CM | POA: Diagnosis not present

## 2019-05-09 DIAGNOSIS — C9110 Chronic lymphocytic leukemia of B-cell type not having achieved remission: Secondary | ICD-10-CM

## 2019-05-09 DIAGNOSIS — Z95828 Presence of other vascular implants and grafts: Secondary | ICD-10-CM

## 2019-05-09 DIAGNOSIS — K068 Other specified disorders of gingiva and edentulous alveolar ridge: Secondary | ICD-10-CM | POA: Diagnosis not present

## 2019-05-09 DIAGNOSIS — R7989 Other specified abnormal findings of blood chemistry: Secondary | ICD-10-CM | POA: Diagnosis not present

## 2019-05-09 DIAGNOSIS — E039 Hypothyroidism, unspecified: Secondary | ICD-10-CM | POA: Diagnosis not present

## 2019-05-09 DIAGNOSIS — Z79899 Other long term (current) drug therapy: Secondary | ICD-10-CM | POA: Diagnosis not present

## 2019-05-09 DIAGNOSIS — Z8619 Personal history of other infectious and parasitic diseases: Secondary | ICD-10-CM | POA: Diagnosis not present

## 2019-05-09 DIAGNOSIS — D61818 Other pancytopenia: Secondary | ICD-10-CM | POA: Diagnosis not present

## 2019-05-09 DIAGNOSIS — D696 Thrombocytopenia, unspecified: Secondary | ICD-10-CM

## 2019-05-09 LAB — CBC WITH DIFFERENTIAL (CANCER CENTER ONLY)
Abs Immature Granulocytes: 0.01 10*3/uL (ref 0.00–0.07)
Basophils Absolute: 0 10*3/uL (ref 0.0–0.1)
Basophils Relative: 0 %
Eosinophils Absolute: 0 10*3/uL (ref 0.0–0.5)
Eosinophils Relative: 0 %
HCT: 25.8 % — ABNORMAL LOW (ref 36.0–46.0)
Hemoglobin: 8.8 g/dL — ABNORMAL LOW (ref 12.0–15.0)
Immature Granulocytes: 0 %
Lymphocytes Relative: 50 %
Lymphs Abs: 1.8 10*3/uL (ref 0.7–4.0)
MCH: 30.4 pg (ref 26.0–34.0)
MCHC: 34.1 g/dL (ref 30.0–36.0)
MCV: 89.3 fL (ref 80.0–100.0)
Monocytes Absolute: 0.2 10*3/uL (ref 0.1–1.0)
Monocytes Relative: 6 %
Neutro Abs: 1.6 10*3/uL — ABNORMAL LOW (ref 1.7–7.7)
Neutrophils Relative %: 44 %
Platelet Count: 32 10*3/uL — ABNORMAL LOW (ref 150–400)
RBC: 2.89 MIL/uL — ABNORMAL LOW (ref 3.87–5.11)
RDW: 14 % (ref 11.5–15.5)
WBC Count: 3.6 10*3/uL — ABNORMAL LOW (ref 4.0–10.5)
nRBC: 0 % (ref 0.0–0.2)

## 2019-05-09 LAB — SAMPLE TO BLOOD BANK

## 2019-05-09 MED ORDER — HEPARIN SOD (PORK) LOCK FLUSH 100 UNIT/ML IV SOLN
500.0000 [IU] | Freq: Once | INTRAVENOUS | Status: AC | PRN
  Administered 2019-05-09: 500 [IU]
  Filled 2019-05-09: qty 5

## 2019-05-09 MED ORDER — SODIUM CHLORIDE 0.9% FLUSH
10.0000 mL | INTRAVENOUS | Status: DC | PRN
  Administered 2019-05-09: 10 mL
  Filled 2019-05-09: qty 10

## 2019-05-11 LAB — CYCLOSPORINE: Cyclosporine, LabCorp: 108 ng/mL (ref 100–400)

## 2019-05-12 ENCOUNTER — Inpatient Hospital Stay (HOSPITAL_BASED_OUTPATIENT_CLINIC_OR_DEPARTMENT_OTHER): Payer: BC Managed Care – PPO | Admitting: Oncology

## 2019-05-12 ENCOUNTER — Inpatient Hospital Stay: Payer: BC Managed Care – PPO

## 2019-05-12 ENCOUNTER — Other Ambulatory Visit: Payer: Self-pay

## 2019-05-12 ENCOUNTER — Encounter: Payer: Self-pay | Admitting: *Deleted

## 2019-05-12 ENCOUNTER — Telehealth: Payer: Self-pay | Admitting: Oncology

## 2019-05-12 VITALS — BP 106/70 | HR 106 | Temp 99.1°F | Resp 17 | Ht 66.0 in | Wt 138.2 lb

## 2019-05-12 DIAGNOSIS — Z79899 Other long term (current) drug therapy: Secondary | ICD-10-CM | POA: Diagnosis not present

## 2019-05-12 DIAGNOSIS — R7989 Other specified abnormal findings of blood chemistry: Secondary | ICD-10-CM | POA: Diagnosis not present

## 2019-05-12 DIAGNOSIS — C9110 Chronic lymphocytic leukemia of B-cell type not having achieved remission: Secondary | ICD-10-CM | POA: Diagnosis not present

## 2019-05-12 DIAGNOSIS — Z95828 Presence of other vascular implants and grafts: Secondary | ICD-10-CM

## 2019-05-12 DIAGNOSIS — K068 Other specified disorders of gingiva and edentulous alveolar ridge: Secondary | ICD-10-CM | POA: Diagnosis not present

## 2019-05-12 DIAGNOSIS — D696 Thrombocytopenia, unspecified: Secondary | ICD-10-CM

## 2019-05-12 DIAGNOSIS — R0600 Dyspnea, unspecified: Secondary | ICD-10-CM | POA: Diagnosis not present

## 2019-05-12 DIAGNOSIS — R748 Abnormal levels of other serum enzymes: Secondary | ICD-10-CM | POA: Diagnosis not present

## 2019-05-12 DIAGNOSIS — D801 Nonfamilial hypogammaglobulinemia: Secondary | ICD-10-CM | POA: Diagnosis not present

## 2019-05-12 DIAGNOSIS — D61818 Other pancytopenia: Secondary | ICD-10-CM | POA: Diagnosis not present

## 2019-05-12 DIAGNOSIS — B1910 Unspecified viral hepatitis B without hepatic coma: Secondary | ICD-10-CM | POA: Diagnosis not present

## 2019-05-12 DIAGNOSIS — Z8619 Personal history of other infectious and parasitic diseases: Secondary | ICD-10-CM | POA: Diagnosis not present

## 2019-05-12 DIAGNOSIS — E039 Hypothyroidism, unspecified: Secondary | ICD-10-CM | POA: Diagnosis not present

## 2019-05-12 DIAGNOSIS — D619 Aplastic anemia, unspecified: Principal | ICD-10-CM

## 2019-05-12 LAB — SAMPLE TO BLOOD BANK

## 2019-05-12 LAB — CBC WITH DIFFERENTIAL (CANCER CENTER ONLY)
Abs Immature Granulocytes: 0.01 10*3/uL (ref 0.00–0.07)
Basophils Absolute: 0 10*3/uL (ref 0.0–0.1)
Basophils Relative: 0 %
Eosinophils Absolute: 0 10*3/uL (ref 0.0–0.5)
Eosinophils Relative: 0 %
HCT: 24.7 % — ABNORMAL LOW (ref 36.0–46.0)
Hemoglobin: 8.6 g/dL — ABNORMAL LOW (ref 12.0–15.0)
Immature Granulocytes: 0 %
Lymphocytes Relative: 50 %
Lymphs Abs: 1.6 10*3/uL (ref 0.7–4.0)
MCH: 30.5 pg (ref 26.0–34.0)
MCHC: 34.8 g/dL (ref 30.0–36.0)
MCV: 87.6 fL (ref 80.0–100.0)
Monocytes Absolute: 0.3 10*3/uL (ref 0.1–1.0)
Monocytes Relative: 8 %
Neutro Abs: 1.4 10*3/uL — ABNORMAL LOW (ref 1.7–7.7)
Neutrophils Relative %: 42 %
Platelet Count: 38 10*3/uL — ABNORMAL LOW (ref 150–400)
RBC: 2.82 MIL/uL — ABNORMAL LOW (ref 3.87–5.11)
RDW: 14.4 % (ref 11.5–15.5)
WBC Count: 3.2 10*3/uL — ABNORMAL LOW (ref 4.0–10.5)
nRBC: 0 % (ref 0.0–0.2)

## 2019-05-12 LAB — CMP (CANCER CENTER ONLY)
ALT: 86 U/L — ABNORMAL HIGH (ref 0–44)
AST: 59 U/L — ABNORMAL HIGH (ref 15–41)
Albumin: 4 g/dL (ref 3.5–5.0)
Alkaline Phosphatase: 165 U/L — ABNORMAL HIGH (ref 38–126)
Anion gap: 8 (ref 5–15)
BUN: 19 mg/dL (ref 6–20)
CO2: 26 mmol/L (ref 22–32)
Calcium: 9.6 mg/dL (ref 8.9–10.3)
Chloride: 105 mmol/L (ref 98–111)
Creatinine: 0.95 mg/dL (ref 0.44–1.00)
GFR, Est AFR Am: >60 mL/min (ref >60)
GFR, Estimated: >60 mL/min (ref >60)
Glucose, Bld: 121 mg/dL — ABNORMAL HIGH (ref 70–99)
Potassium: 4.5 mmol/L (ref 3.5–5.1)
Sodium: 139 mmol/L (ref 135–145)
Total Bilirubin: 0.6 mg/dL (ref 0.3–1.2)
Total Protein: 6.1 g/dL — ABNORMAL LOW (ref 6.5–8.1)

## 2019-05-12 MED ORDER — SODIUM CHLORIDE 0.9% FLUSH
10.0000 mL | INTRAVENOUS | Status: DC | PRN
  Administered 2019-05-12: 10 mL
  Filled 2019-05-12: qty 10

## 2019-05-12 MED ORDER — CYCLOSPORINE MODIFIED 100 MG CAPSULE: 200 mg | capsule | Freq: Two times a day (BID) | 2 refills | 30 days | Status: AC

## 2019-05-12 MED ORDER — SULFAMETHOXAZOLE 800 MG-TRIMETHOPRIM 160 MG TABLET: 1 | tablet | Freq: Two times a day (BID) | 11 refills | 28 days | Status: AC

## 2019-05-12 NOTE — Progress Notes (Unsigned)
Faxed cyclosporin results drawn on 05/09/19 to [place] att: Joellen Jersey, PharmD at [phone].

## 2019-05-12 NOTE — Telephone Encounter (Signed)
Scheduled per 10/19 los, patient will receive notifications on my chart.

## 2019-05-12 NOTE — Progress Notes (Signed)
[place] OFFICE PROGRESS NOTE   Diagnosis: CLL, aplastic anemia  INTERVAL HISTORY:   Meagan Walsh returns as scheduled.  She denies fever.  No mouth bleeding.  Good appetite.  She continues to have nausea, partially relieved with antiemetics.  She has occasional diarrhea.  She has exertional dyspnea and fatigues easily.  Objective:  Vital signs in last 24 hours:  Blood pressure 106/70, pulse (!) 106, temperature 99.1 F (37.3 C), temperature source Temporal, resp. rate 17, height '5\' 6"'$  (1.676 m), weight 138 lb 3.2 oz (62.7 kg), SpO2 100 %. Repeat manual pulse-92  HEENT: No thrush.  No bleeding. Resp: Lungs clear bilaterally Cardio: Regular rate and rhythm GI: No hepatosplenomegaly, soft, nontender Vascular: No leg edema  Skin: No ecchymoses over the extremities  Portacath/PICC-without erythema  Lab Results:  Lab Results  Component Value Date   WBC 3.2 (L) 05/12/2019   HGB 8.6 (L) 05/12/2019   HCT 24.7 (L) 05/12/2019   MCV 87.6 05/12/2019   PLT 38 (L) 05/12/2019   NEUTROABS 1.4 (L) 05/12/2019    CMP  Lab Results  Component Value Date   NA 139 05/12/2019   K 4.5 05/12/2019   CL 105 05/12/2019   CO2 26 05/12/2019   GLUCOSE 121 (H) 05/12/2019   BUN 19 05/12/2019   CREATININE 0.95 05/12/2019   CALCIUM 9.6 05/12/2019   PROT 6.1 (L) 05/12/2019   ALBUMIN 4.0 05/12/2019   AST 59 (H) 05/12/2019   ALT 86 (H) 05/12/2019   ALKPHOS 165 (H) 05/12/2019   BILITOT 0.6 05/12/2019   GFRNONAA >60 05/12/2019   GFRAA >60 05/12/2019    No results found for: CEA1  Lab Results  Component Value Date   INR 1.4 (H) 12/04/2018    Imaging:  No results found.  Medications: I have reviewed the patient's current medications.   Assessment/Plan: 1. CLL-diagnosed in August 2010, flow cytometry consistent with CLL Enlarged leftinguinal lymph node January 2019,smallneck/axillary nodes and palpable splenomegaly 09/11/2017  CTson 09/17/2017-3 cm necrotic  appearing lymph node in the left inguinal region, borderline enlarged pelvic/retroperitoneal, chest, and axillary nodes. Mild splenomegaly.  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, no evidence of 11q22deletion  Bone marrow biopsy 02/26/2018-hypercellular marrow with extensive involvement by CLL, lymphocytes represent85% of all cells  Ibrutinib initiated 04/03/2018  Ibrutinib placed on hold 04/11/2018 due to onset of arthralgias  Ibrutinib resumed 04/16/2018, discontinued 04/25/2018 secondary to severe arthralgias/arthritis  Ibrutinib resumed at a dose of 140 mg daily 05/03/2018  Ibrutinib dose adjusted to 140 mg alternating with '280mg'$  06/25/2018  Ibrutinib discontinued 07/03/2018 secondary to severe arthralgias  Acalabrutinib 08/16/2018, discontinued 11/15/2018 secondary to persistent severe transfusion dependent anemia and neutropenia/thrombocytopenia, last dose 11/14/2018  Bone marrow biopsy 11/21/2018-decreased cellularity, involvement by CLL, decreased erythroid and granulocytic precursors, decreased megakaryocytes  Cycle 1 rituximab 12/06/2018  Bone marrow biopsy 12/24/2018 at [facility]-hypocellular bone marrow (10%) involved by CLL, representing 50% of marrow cellularity; markedly decreased trilineage hematopoiesis including essentially absent erythropoiesis  Bone marrow biopsy 03/06/2027 [facility]-hypocellular bone marrow (20%) with marked reduction of  maturing hematopoietic elements; numerous lymphoid aggregates  consistent with involvement by CLL, representing approximately 50% of marrow cellularity by flow cytometry  ATG/cyclosporine at [facility] beginning 03/19/2019 followed by prednisone taper  2.Hypothyroidism 3.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 Augmentin prescribed. 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 Pancytopenia secondary to CLL and a hypocellular bone marrow  G-CSF and Nplate started 7/42/5525, G-CSF changed to daily beginning 12/17/2018; G-CSF discontinued 12/31/2018, last Nplate 01/15/2019  Began prednisone 60 mg daily 01/11/19, tolerating moderately well except jitteriness, irritability, and difficulty sleeping. Tapered to 40 mg daily x1 week starting 01/24/19, reduce by 10 mg each week until discontinued. Prednisone discontinued 02/20/2019.  Promacta started 01/20/2019, dose increased to 100 mg daily 02/04/2019; dose increased to 150 mg daily 02/21/2019  IVIG daily for 2 days beginning 02/25/2019   9. Severe headache and nausea/vomiting 02/27/2019-likely related to IVIG therapy,resolved    Disposition: Meagan Walsh appears unchanged.  The platelet and neutrophil counts have partially recovered over the past 2 weeks.  She will discontinue the Amicar.  She will use heart rinse as needed.  She continues to have malaise and exertional dyspnea.  This is in part secondary to and deconditioning.  We will plan for 1 unit of red blood cells later this week.  She will return for office and lab visit in 1 week.  The plan is to switch to  weekly labs if the counts remain improved.  She continues cyclosporine, dosing is managed by [facility].  Nausea and diarrhea are likely secondary to cyclosporine.  Intermittent mild elevation of liver enzymes may also be related to cyclosporine.    Betsy Coder, MD  05/12/2019  10:20 AM

## 2019-05-13 LAB — FERRITIN: Ferritin: 3912 ng/mL — ABNORMAL HIGH (ref 11–307)

## 2019-05-14 ENCOUNTER — Telehealth: Payer: Self-pay | Admitting: *Deleted

## 2019-05-14 ENCOUNTER — Other Ambulatory Visit: Payer: Self-pay | Admitting: Psychiatry

## 2019-05-14 MED ORDER — CYCLOSPORINE MODIFIED (NEORAL) 100 MG/ML PO SOLN: 175.0000 mg | Freq: Two times a day (BID) | ORAL | 0 refills | Status: DC

## 2019-05-14 NOTE — Telephone Encounter (Addendum)
Per Dr. Benay Spice: Ferritin is high. Needs MRI liver to look for iron deposition. If iron high in liver will need to start Exjade. Will attempt to hold transfusions as long as Hgb is >8.0. Notified patient of above message from Dr. Benay Spice. She understands and agrees. Reports she is claustrophobic and will require valium prior to procedure. Confirmed she has no metal in her body. Will schedule MRI as soon as PA is completed.Confirmed with her that she is only taking the cyclosporin capsules with dosing of 200 mg bid per Dr. Prince Solian direction. She currently has 15 day supply on hand.

## 2019-05-15 ENCOUNTER — Other Ambulatory Visit: Payer: Self-pay | Admitting: Oncology

## 2019-05-16 ENCOUNTER — Inpatient Hospital Stay: Payer: BC Managed Care – PPO

## 2019-05-16 ENCOUNTER — Telehealth: Payer: Self-pay

## 2019-05-16 ENCOUNTER — Other Ambulatory Visit: Payer: Self-pay | Admitting: *Deleted

## 2019-05-16 ENCOUNTER — Other Ambulatory Visit: Payer: Self-pay

## 2019-05-16 DIAGNOSIS — E039 Hypothyroidism, unspecified: Secondary | ICD-10-CM | POA: Diagnosis not present

## 2019-05-16 DIAGNOSIS — R748 Abnormal levels of other serum enzymes: Secondary | ICD-10-CM | POA: Diagnosis not present

## 2019-05-16 DIAGNOSIS — D649 Anemia, unspecified: Secondary | ICD-10-CM

## 2019-05-16 DIAGNOSIS — C9110 Chronic lymphocytic leukemia of B-cell type not having achieved remission: Secondary | ICD-10-CM | POA: Diagnosis not present

## 2019-05-16 DIAGNOSIS — K068 Other specified disorders of gingiva and edentulous alveolar ridge: Secondary | ICD-10-CM | POA: Diagnosis not present

## 2019-05-16 DIAGNOSIS — D801 Nonfamilial hypogammaglobulinemia: Secondary | ICD-10-CM | POA: Diagnosis not present

## 2019-05-16 DIAGNOSIS — Z8619 Personal history of other infectious and parasitic diseases: Secondary | ICD-10-CM | POA: Diagnosis not present

## 2019-05-16 DIAGNOSIS — R7989 Other specified abnormal findings of blood chemistry: Secondary | ICD-10-CM | POA: Diagnosis not present

## 2019-05-16 DIAGNOSIS — R0600 Dyspnea, unspecified: Secondary | ICD-10-CM | POA: Diagnosis not present

## 2019-05-16 DIAGNOSIS — Z79899 Other long term (current) drug therapy: Secondary | ICD-10-CM | POA: Diagnosis not present

## 2019-05-16 DIAGNOSIS — D696 Thrombocytopenia, unspecified: Secondary | ICD-10-CM

## 2019-05-16 DIAGNOSIS — B1910 Unspecified viral hepatitis B without hepatic coma: Secondary | ICD-10-CM | POA: Diagnosis not present

## 2019-05-16 DIAGNOSIS — D61818 Other pancytopenia: Secondary | ICD-10-CM | POA: Diagnosis not present

## 2019-05-16 DIAGNOSIS — Z95828 Presence of other vascular implants and grafts: Secondary | ICD-10-CM

## 2019-05-16 LAB — CBC WITH DIFFERENTIAL (CANCER CENTER ONLY)
Abs Immature Granulocytes: 0 10*3/uL (ref 0.00–0.07)
Basophils Absolute: 0 10*3/uL (ref 0.0–0.1)
Basophils Relative: 0 %
Eosinophils Absolute: 0 10*3/uL (ref 0.0–0.5)
Eosinophils Relative: 1 %
HCT: 21.1 % — ABNORMAL LOW (ref 36.0–46.0)
Hemoglobin: 7.3 g/dL — ABNORMAL LOW (ref 12.0–15.0)
Immature Granulocytes: 0 %
Lymphocytes Relative: 52 %
Lymphs Abs: 1.4 10*3/uL (ref 0.7–4.0)
MCH: 30.7 pg (ref 26.0–34.0)
MCHC: 34.6 g/dL (ref 30.0–36.0)
MCV: 88.7 fL (ref 80.0–100.0)
Monocytes Absolute: 0.2 10*3/uL (ref 0.1–1.0)
Monocytes Relative: 8 %
Neutro Abs: 1.1 10*3/uL — ABNORMAL LOW (ref 1.7–7.7)
Neutrophils Relative %: 39 %
Platelet Count: 42 10*3/uL — ABNORMAL LOW (ref 150–400)
RBC: 2.38 MIL/uL — ABNORMAL LOW (ref 3.87–5.11)
RDW: 14.3 % (ref 11.5–15.5)
WBC Count: 2.8 10*3/uL — ABNORMAL LOW (ref 4.0–10.5)
nRBC: 0 % (ref 0.0–0.2)

## 2019-05-16 LAB — CMP (CANCER CENTER ONLY)
ALT: 56 U/L — ABNORMAL HIGH (ref 0–44)
AST: 29 U/L (ref 15–41)
Albumin: 3.9 g/dL (ref 3.5–5.0)
Alkaline Phosphatase: 146 U/L — ABNORMAL HIGH (ref 38–126)
Anion gap: 7 (ref 5–15)
BUN: 20 mg/dL (ref 6–20)
CO2: 27 mmol/L (ref 22–32)
Calcium: 9.8 mg/dL (ref 8.9–10.3)
Chloride: 108 mmol/L (ref 98–111)
Creatinine: 0.84 mg/dL (ref 0.44–1.00)
GFR, Est AFR Am: >60 mL/min (ref >60)
GFR, Estimated: >60 mL/min (ref >60)
Glucose, Bld: 123 mg/dL — ABNORMAL HIGH (ref 70–99)
Potassium: 4.3 mmol/L (ref 3.5–5.1)
Sodium: 142 mmol/L (ref 135–145)
Total Bilirubin: 0.6 mg/dL (ref 0.3–1.2)
Total Protein: 5.9 g/dL — ABNORMAL LOW (ref 6.5–8.1)

## 2019-05-16 LAB — SAMPLE TO BLOOD BANK

## 2019-05-16 LAB — PREPARE RBC (CROSSMATCH)

## 2019-05-16 MED ORDER — HEPARIN SOD (PORK) LOCK FLUSH 100 UNIT/ML IV SOLN
250.0000 [IU] | INTRAVENOUS | Status: DC | PRN
  Filled 2019-05-16: qty 5

## 2019-05-16 MED ORDER — HEPARIN SOD (PORK) LOCK FLUSH 100 UNIT/ML IV SOLN
500.0000 [IU] | Freq: Once | INTRAVENOUS | Status: AC | PRN
  Administered 2019-05-16: 250 [IU]
  Filled 2019-05-16: qty 5

## 2019-05-16 MED ORDER — SODIUM CHLORIDE 0.9% FLUSH
10.0000 mL | INTRAVENOUS | Status: DC | PRN
  Administered 2019-05-16 (×2): 10 mL
  Filled 2019-05-16: qty 10

## 2019-05-16 MED ORDER — SODIUM CHLORIDE 0.9% FLUSH
10.0000 mL | INTRAVENOUS | Status: DC | PRN
  Filled 2019-05-16: qty 10

## 2019-05-16 MED ORDER — SODIUM CHLORIDE 0.9% IV SOLUTION
250.0000 mL | Freq: Once | INTRAVENOUS | Status: AC
  Administered 2019-05-16: 250 mL via INTRAVENOUS
  Filled 2019-05-16: qty 250

## 2019-05-16 MED ORDER — HEPARIN SOD (PORK) LOCK FLUSH 100 UNIT/ML IV SOLN
250.0000 [IU] | INTRAVENOUS | Status: AC | PRN
  Administered 2019-05-16: 250 [IU]
  Filled 2019-05-16: qty 5

## 2019-05-16 NOTE — Progress Notes (Signed)
Notified blood bank that she will need 1 unit blood today. Orders placed.

## 2019-05-16 NOTE — Telephone Encounter (Signed)
PICC dressing changed small bio patch placed, waiting for larger bio patch to be re stocked.

## 2019-05-18 LAB — BPAM RBC
Blood Product Expiration Date: 202011192359
ISSUE DATE / TIME: 202010231219
Unit Type and Rh: 5100

## 2019-05-18 LAB — TYPE AND SCREEN
ABO/RH(D): O POS
Antibody Screen: NEGATIVE
Unit division: 0

## 2019-05-19 ENCOUNTER — Inpatient Hospital Stay (HOSPITAL_BASED_OUTPATIENT_CLINIC_OR_DEPARTMENT_OTHER): Payer: BC Managed Care – PPO | Admitting: Oncology

## 2019-05-19 ENCOUNTER — Telehealth: Payer: Self-pay | Admitting: Oncology

## 2019-05-19 ENCOUNTER — Inpatient Hospital Stay: Payer: BC Managed Care – PPO

## 2019-05-19 ENCOUNTER — Other Ambulatory Visit: Payer: Self-pay

## 2019-05-19 ENCOUNTER — Telehealth: Payer: Self-pay | Admitting: *Deleted

## 2019-05-19 ENCOUNTER — Other Ambulatory Visit: Payer: Self-pay | Admitting: *Deleted

## 2019-05-19 VITALS — BP 112/63 | HR 93 | Temp 98.7°F | Resp 18 | Ht 66.0 in | Wt 142.4 lb

## 2019-05-19 DIAGNOSIS — R0600 Dyspnea, unspecified: Secondary | ICD-10-CM | POA: Diagnosis not present

## 2019-05-19 DIAGNOSIS — K068 Other specified disorders of gingiva and edentulous alveolar ridge: Secondary | ICD-10-CM | POA: Diagnosis not present

## 2019-05-19 DIAGNOSIS — R748 Abnormal levels of other serum enzymes: Secondary | ICD-10-CM | POA: Diagnosis not present

## 2019-05-19 DIAGNOSIS — D649 Anemia, unspecified: Secondary | ICD-10-CM

## 2019-05-19 DIAGNOSIS — C9110 Chronic lymphocytic leukemia of B-cell type not having achieved remission: Secondary | ICD-10-CM | POA: Diagnosis not present

## 2019-05-19 DIAGNOSIS — D801 Nonfamilial hypogammaglobulinemia: Secondary | ICD-10-CM | POA: Diagnosis not present

## 2019-05-19 DIAGNOSIS — B1910 Unspecified viral hepatitis B without hepatic coma: Secondary | ICD-10-CM | POA: Diagnosis not present

## 2019-05-19 DIAGNOSIS — Z8619 Personal history of other infectious and parasitic diseases: Secondary | ICD-10-CM | POA: Diagnosis not present

## 2019-05-19 DIAGNOSIS — E039 Hypothyroidism, unspecified: Secondary | ICD-10-CM | POA: Diagnosis not present

## 2019-05-19 DIAGNOSIS — R7989 Other specified abnormal findings of blood chemistry: Secondary | ICD-10-CM | POA: Diagnosis not present

## 2019-05-19 DIAGNOSIS — Z79899 Other long term (current) drug therapy: Secondary | ICD-10-CM | POA: Diagnosis not present

## 2019-05-19 DIAGNOSIS — D61818 Other pancytopenia: Secondary | ICD-10-CM | POA: Diagnosis not present

## 2019-05-19 LAB — CBC WITH DIFFERENTIAL (CANCER CENTER ONLY)
Abs Immature Granulocytes: 0 10*3/uL (ref 0.00–0.07)
Basophils Absolute: 0 10*3/uL (ref 0.0–0.1)
Basophils Relative: 0 %
Eosinophils Absolute: 0 10*3/uL (ref 0.0–0.5)
Eosinophils Relative: 0 %
HCT: 22.3 % — ABNORMAL LOW (ref 36.0–46.0)
Hemoglobin: 7.6 g/dL — ABNORMAL LOW (ref 12.0–15.0)
Immature Granulocytes: 0 %
Lymphocytes Relative: 52 %
Lymphs Abs: 1.2 10*3/uL (ref 0.7–4.0)
MCH: 30.4 pg (ref 26.0–34.0)
MCHC: 34.1 g/dL (ref 30.0–36.0)
MCV: 89.2 fL (ref 80.0–100.0)
Monocytes Absolute: 0.2 10*3/uL (ref 0.1–1.0)
Monocytes Relative: 9 %
Neutro Abs: 0.9 10*3/uL — ABNORMAL LOW (ref 1.7–7.7)
Neutrophils Relative %: 39 %
Platelet Count: 35 10*3/uL — ABNORMAL LOW (ref 150–400)
RBC: 2.5 MIL/uL — ABNORMAL LOW (ref 3.87–5.11)
RDW: 14.2 % (ref 11.5–15.5)
WBC Count: 2.3 10*3/uL — ABNORMAL LOW (ref 4.0–10.5)
nRBC: 0 % (ref 0.0–0.2)

## 2019-05-19 LAB — SAMPLE TO BLOOD BANK

## 2019-05-19 LAB — PREPARE RBC (CROSSMATCH)

## 2019-05-19 LAB — CYCLOSPORINE: Cyclosporine, LabCorp: 116 ng/mL (ref 100–400)

## 2019-05-19 MED ORDER — SULFAMETHOXAZOLE-TRIMETHOPRIM 800-160 MG PO TABS: 1.0000 | ORAL_TABLET | ORAL | 5 refills | Status: DC

## 2019-05-19 MED ORDER — SODIUM CHLORIDE 0.9% FLUSH
10.0000 mL | INTRAVENOUS | Status: DC | PRN
  Filled 2019-05-19: qty 10

## 2019-05-19 MED ORDER — HEPARIN SOD (PORK) LOCK FLUSH 100 UNIT/ML IV SOLN
500.0000 [IU] | Freq: Once | INTRAVENOUS | Status: DC
  Filled 2019-05-19: qty 5

## 2019-05-19 NOTE — Telephone Encounter (Signed)
I talk with patient regarding schedule and waiting to see if the blood will need to be moved

## 2019-05-19 NOTE — Progress Notes (Signed)
[place] OFFICE PROGRESS NOTE   Diagnosis: CLL, aplastic anemia  INTERVAL HISTORY:   Meagan Walsh received 1 unit of packed red blood cells on 05/16/2019.  She reports improvement in dyspnea after the transfusion.   She had a small amount of bleeding from the gum when she ate something far.  No other bleeding.  No fever.  She continues cyclosporine.  She has nausea, this has improved since taking Zofran prior to each dose of cyclosporine.  No diarrhea.  Objective:  Vital signs in last 24 hours:  Blood pressure 112/63, pulse 93, temperature 98.7 F (37.1 C), temperature source Temporal, resp. rate 18, height '5\' 6"'  (1.676 m), weight 142 lb 6.4 oz (64.6 kg), SpO2 100 %.    HEENT: No thrush or bleeding Lymphatics: No cervical or supraclavicular nodes Resp: Lungs clear bilaterally Cardio: Regular rate and rhythm GI: No hepatosplenomegaly, nontender Vascular: No leg edema  Skin: No ecchymoses  Portacath/PICC-small area of erythema/hemorrhage at the upper aspect of the PICC tape  Lab Results:  Lab Results  Component Value Date   WBC 2.3 (L) 05/19/2019   HGB 7.6 (L) 05/19/2019   HCT 22.3 (L) 05/19/2019   MCV 89.2 05/19/2019   PLT 35 (L) 05/19/2019   NEUTROABS 0.9 (L) 05/19/2019    CMP  Lab Results  Component Value Date   NA 142 05/16/2019   K 4.3 05/16/2019   CL 108 05/16/2019   CO2 27 05/16/2019   GLUCOSE 123 (H) 05/16/2019   BUN 20 05/16/2019   CREATININE 0.84 05/16/2019   CALCIUM 9.8 05/16/2019   PROT 5.9 (L) 05/16/2019   ALBUMIN 3.9 05/16/2019   AST 29 05/16/2019   ALT 56 (H) 05/16/2019   ALKPHOS 146 (H) 05/16/2019   BILITOT 0.6 05/16/2019   GFRNONAA >60 05/16/2019   GFRAA >60 05/16/2019   05/12/2019: Ferritin-3912  05/16/2019: Cyclosporine-160  Medications: I have reviewed the patient's current medications.   Assessment/Plan: 1. CLL-diagnosed in August 2010, flow cytometry consistent with CLL Enlarged leftinguinal lymph node January  2019,smallneck/axillary nodes and palpable splenomegaly 09/11/2017  CTson 09/17/2017-3 cm necrotic appearing lymph node in the left inguinal region, borderline enlarged pelvic/retroperitoneal, chest, and axillary nodes. Mild splenomegaly.  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, no evidence of 11q22deletion  Bone marrow biopsy 02/26/2018-hypercellular marrow with extensive involvement by CLL, lymphocytes represent85% of all cells  Ibrutinib initiated 04/03/2018  Ibrutinib placed on hold 04/11/2018 due to onset of arthralgias  Ibrutinib resumed 04/16/2018, discontinued 04/25/2018 secondary to severe arthralgias/arthritis  Ibrutinib resumed at a dose of 140 mg daily 05/03/2018  Ibrutinib dose adjusted to 140 mg alternating with 283m 06/25/2018  Ibrutinib discontinued 07/03/2018 secondary to severe arthralgias  Acalabrutinib 08/16/2018, discontinued 11/15/2018 secondary to persistent severe transfusion dependent anemia and neutropenia/thrombocytopenia, last dose 11/14/2018  Bone marrow biopsy 11/21/2018-decreased cellularity, involvement by CLL, decreased erythroid and granulocytic precursors, decreased megakaryocytes  Cycle 1 rituximab 12/06/2018  Bone marrow biopsy 12/24/2018 at [facility]-hypocellular bone marrow (10%) involved by CLL, representing 50% of marrow cellularity; markedly decreased trilineage hematopoiesis including essentially absent  erythropoiesis  Bone marrow biopsy  03/06/2027 [facility]-hypocellular bone marrow (20%) with marked reduction of maturing hematopoietic elements; numerous lymphoid aggregates consistent with involvement by CLL, representing approximately 50% of marrow cellularity by flow cytometry  ATG/cyclosporine at [facility] beginning 03/19/2019 followed by prednisone taper  2.Hypothyroidism 3.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 Augmentin prescribed. 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 Pancytopenia secondary to CLL and a hypocellular bone marrow  G-CSF and Nplate started 10/30/8117, G-CSF changed to daily beginning 12/17/2018; G-CSF discontinued 12/31/2018, last Nplate 01/15/2019  Began prednisone 60 mg daily 01/11/19, tolerating moderately well except jitteriness, irritability, and difficulty sleeping. Tapered to 40 mg daily x1 week starting 01/24/19, reduce by 10 mg each week until discontinued. Prednisone discontinued 02/20/2019.  Promacta started 01/20/2019, dose increased to 100 mg daily 02/04/2019; dose increased to 150 mg daily 02/21/2019  IVIG daily for 2 days beginning 02/25/2019   9. Severe headache and nausea/vomiting 02/27/2019-likely related to IVIG therapy,resolved     Disposition: Meagan Walsh is now almost 9 weeks out from initiation of ATG/cyclosporine therapy.  She continues to have pancytopenia requiring intermittent red cell transfusions.  The platelets and white count have partially proved.  She will continue cyclosporine.  I will communicate with Dr. Prince Solian regarding other treatment options  including Promacta.  She will be scheduled for a red cell transfusion on 05/22/2019.  The ferritin level is elevated.  We will schedule a hepatic MRI there here or at [facility] to decide on beginning Exjade therapy.  I will communicate with interventional radiology regarding the need to exchange the PICC  [name] will be scheduled for an office visit in 1 week.  Betsy Coder, MD  05/19/2019  2:00 PM

## 2019-05-19 NOTE — Telephone Encounter (Signed)
Notified of ANC 0.9. MD thinks it is OK to go to the dentist now and suggests she get in this week if possible. Dr. Prince Solian out of the office till Thursday--will get MRI decision at that point.  Cyclosporin result faxed to Joellen Jersey, PharmD at [place]

## 2019-05-19 NOTE — Progress Notes (Signed)
Notified blood bank of need for 1 unit blood on 10/29 at 0800 at [facility]. Orders in [place].

## 2019-05-20 ENCOUNTER — Telehealth: Payer: Self-pay | Admitting: *Deleted

## 2019-05-20 ENCOUNTER — Other Ambulatory Visit: Payer: Self-pay | Admitting: Oncology

## 2019-05-20 DIAGNOSIS — C9110 Chronic lymphocytic leukemia of B-cell type not having achieved remission: Secondary | ICD-10-CM

## 2019-05-20 NOTE — Telephone Encounter (Signed)
-----   Message from Ladell Pier, MD sent at 05/19/2019  1:36 PM EDT ----- Please call patient, anc 0.9,  anc and platelets remain good Plan for rbc transfusion Waiting to hear from [facility] about liver MRI

## 2019-05-21 ENCOUNTER — Telehealth: Payer: Self-pay | Admitting: *Deleted

## 2019-05-21 MED ORDER — DIAZEPAM 5 MG PO TABS: ORAL_TABLET | ORAL | 0 refills | Status: DC

## 2019-05-21 NOTE — Telephone Encounter (Addendum)
Dr. Benay Spice ordered MRI at [address]--valium was ordered. Sent message to managed care to request notification when it is authorized. Spoke w/Katie, PharmD at [facility]. Cyclosporin level was noted by her and Dr. Marylin Crosby stay on same dose and adjust with next level if still low. Also made patient aware of MRI on 06/12/19 at [address] at 4:20/4:40 pm. Valium has been sent in to take 1 hour prior and she must have a driver. She understands and agres.

## 2019-05-22 ENCOUNTER — Other Ambulatory Visit: Payer: Self-pay | Admitting: *Deleted

## 2019-05-22 ENCOUNTER — Other Ambulatory Visit: Payer: Self-pay

## 2019-05-22 ENCOUNTER — Ambulatory Visit (HOSPITAL_COMMUNITY)
Admission: RE | Admit: 2019-05-22 | Discharge: 2019-05-22 | Disposition: A | Discharge status: Home / Self Care | Payer: BC Managed Care – PPO | Source: Ambulatory Visit | Attending: Internal Medicine | Admitting: Internal Medicine

## 2019-05-22 ENCOUNTER — Encounter: Payer: Self-pay | Admitting: *Deleted

## 2019-05-22 DIAGNOSIS — C9110 Chronic lymphocytic leukemia of B-cell type not having achieved remission: Secondary | ICD-10-CM | POA: Diagnosis not present

## 2019-05-22 DIAGNOSIS — D649 Anemia, unspecified: Secondary | ICD-10-CM | POA: Diagnosis not present

## 2019-05-22 MED ORDER — HEPARIN SOD (PORK) LOCK FLUSH 100 UNIT/ML IV SOLN
250.0000 [IU] | INTRAVENOUS | Status: AC | PRN
  Administered 2019-05-22: 250 [IU]
  Filled 2019-05-22: qty 5

## 2019-05-22 MED ORDER — SODIUM CHLORIDE 0.9% IV SOLUTION
250.0000 mL | Freq: Once | INTRAVENOUS | Status: AC
  Administered 2019-05-22: 250 mL via INTRAVENOUS

## 2019-05-22 MED ORDER — SODIUM CHLORIDE 0.9% FLUSH
10.0000 mL | INTRAVENOUS | Status: AC | PRN
  Administered 2019-05-22: 10 mL

## 2019-05-22 NOTE — Progress Notes (Signed)
Patient received 1 unit of packed red blood cells via a PICC line. The line was accessed and de-accessed according to protocol Tolerated well, vitals stable, discharge instructions given, verbalized understanding. Patient alert, oriented and ambulatory at the time of discharge.

## 2019-05-22 NOTE — Discharge Instructions (Signed)
Blood Transfusion, Adult, Care After This sheet gives you information about how to care for yourself after your procedure. Your doctor may also give you more specific instructions. If you have problems or questions, contact your doctor. Follow these instructions at home:   Take over-the-counter and prescription medicines only as told by your doctor.  Go back to your normal activities as told by your doctor.  Follow instructions from your doctor about how to take care of the area where an IV tube was put into your vein (insertion site). Make sure you: ? Wash your hands with soap and water before you change your bandage (dressing). If there is no soap and water, use hand sanitizer. ? Change your bandage as told by your doctor.  Check your IV insertion site every day for signs of infection. Check for: ? More redness, swelling, or pain. ? More fluid or blood. ? Warmth. ? Pus or a bad smell. Contact a doctor if:  You have more redness, swelling, or pain around the IV insertion site.  You have more fluid or blood coming from the IV insertion site.  Your IV insertion site feels warm to the touch.  You have pus or a bad smell coming from the IV insertion site.  Your pee (urine) turns pink, red, or brown.  You feel weak after doing your normal activities. Get help right away if:  You have signs of a serious allergic or body defense (immune) system reaction, including: ? Itchiness. ? Hives. ? Trouble breathing. ? Anxiety. ? Pain in your chest or lower back. ? Fever, flushing, and chills. ? Fast pulse. ? Rash. ? Watery poop (diarrhea). ? Throwing up (vomiting). ? Dark pee. ? Serious headache. ? Dizziness. ? Stiff neck. ? Yellow color in your face or the white parts of your eyes (jaundice). Summary  After a blood transfusion, return to your normal activities as told by your doctor.  Every day, check for signs of infection where the IV tube was put into your vein.  Some  signs of infection are warm skin, more redness and pain, more fluid or blood, and pus or a bad smell where the needle went in.  Contact your doctor if you feel weak or have any unusual symptoms. This information is not intended to replace advice given to you by your health care provider. Make sure you discuss any questions you have with your health care provider. Document Released: 07/31/2014 Document Revised: 11/14/2017 Document Reviewed: 03/03/2016 Elsevier Patient Education  2020 [employer]  

## 2019-05-22 NOTE — Progress Notes (Signed)
Per Dr. Benay Spice: OK to collect cyclosporin level on 05/26/19. Order placed. Sent reminder to patient via Mychart to hold her am dose on 11/2 till after blood drawn.

## 2019-05-23 ENCOUNTER — Other Ambulatory Visit: Payer: Self-pay

## 2019-05-23 ENCOUNTER — Inpatient Hospital Stay: Payer: BC Managed Care – PPO

## 2019-05-23 ENCOUNTER — Ambulatory Visit: Payer: BC Managed Care – PPO

## 2019-05-23 DIAGNOSIS — Z8619 Personal history of other infectious and parasitic diseases: Secondary | ICD-10-CM | POA: Diagnosis not present

## 2019-05-23 DIAGNOSIS — K068 Other specified disorders of gingiva and edentulous alveolar ridge: Secondary | ICD-10-CM | POA: Diagnosis not present

## 2019-05-23 DIAGNOSIS — D801 Nonfamilial hypogammaglobulinemia: Secondary | ICD-10-CM | POA: Diagnosis not present

## 2019-05-23 DIAGNOSIS — Z95828 Presence of other vascular implants and grafts: Secondary | ICD-10-CM

## 2019-05-23 DIAGNOSIS — D61818 Other pancytopenia: Secondary | ICD-10-CM | POA: Diagnosis not present

## 2019-05-23 DIAGNOSIS — R748 Abnormal levels of other serum enzymes: Secondary | ICD-10-CM | POA: Diagnosis not present

## 2019-05-23 DIAGNOSIS — B1910 Unspecified viral hepatitis B without hepatic coma: Secondary | ICD-10-CM | POA: Diagnosis not present

## 2019-05-23 DIAGNOSIS — R0600 Dyspnea, unspecified: Secondary | ICD-10-CM | POA: Diagnosis not present

## 2019-05-23 DIAGNOSIS — Z79899 Other long term (current) drug therapy: Secondary | ICD-10-CM | POA: Diagnosis not present

## 2019-05-23 DIAGNOSIS — D696 Thrombocytopenia, unspecified: Secondary | ICD-10-CM

## 2019-05-23 DIAGNOSIS — C9110 Chronic lymphocytic leukemia of B-cell type not having achieved remission: Secondary | ICD-10-CM | POA: Diagnosis not present

## 2019-05-23 DIAGNOSIS — E039 Hypothyroidism, unspecified: Secondary | ICD-10-CM | POA: Diagnosis not present

## 2019-05-23 DIAGNOSIS — R7989 Other specified abnormal findings of blood chemistry: Secondary | ICD-10-CM | POA: Diagnosis not present

## 2019-05-23 LAB — TYPE AND SCREEN
ABO/RH(D): O POS
Antibody Screen: NEGATIVE
Unit division: 0

## 2019-05-23 LAB — BPAM RBC
Blood Product Expiration Date: 202011232359
ISSUE DATE / TIME: 202010290843
Unit Type and Rh: 5100

## 2019-05-23 MED ORDER — SODIUM CHLORIDE 0.9% FLUSH
10.0000 mL | INTRAVENOUS | Status: DC | PRN
  Administered 2019-05-23: 10 mL
  Filled 2019-05-23: qty 10

## 2019-05-23 MED ORDER — HEPARIN SOD (PORK) LOCK FLUSH 100 UNIT/ML IV SOLN
500.0000 [IU] | Freq: Once | INTRAVENOUS | Status: AC | PRN
  Administered 2019-05-23: 14:00:00 250 [IU]
  Filled 2019-05-23: qty 5

## 2019-05-23 NOTE — Patient Instructions (Signed)
PICC Home Care Guide ° °A peripherally inserted central catheter (PICC) is a form of IV access that allows medicines and IV fluids to be quickly distributed throughout the body. The PICC is a long, thin, flexible tube (catheter) that is inserted into a vein in the upper arm. The catheter ends in a large vein in the chest (superior vena cava, or SVC). After the PICC is inserted, a chest X-ray may be done to make sure that it is in the correct place. °A PICC may be placed for different reasons, such as: °· To give medicines and liquid nutrition. °· To give IV fluids and blood products. °· If there is trouble placing a peripheral intravenous (PIV) catheter. °If taken care of properly, a PICC can remain in place for several months. Having a PICC can also allow a person to go home from the hospital sooner. Medicine and PICC care can be managed at home by a family member, caregiver, or home health care team. °What are the risks? °Generally, having a PICC is safe. However, problems may occur, including: °· A blood clot (thrombus) forming in or at the tip of the PICC. °· A blood clot forming in a vein (deep vein thrombosis) or traveling to the lung (pulmonary embolism). °· Inflammation of the vein (phlebitis) in which the PICC is placed. °· Infection. Central line associated blood stream infection (CLABSI) is a serious infection that often requires hospitalization. °· PICC movement (malposition). The PICC tip may move from its original position due to excessive physical activity, forceful coughing, sneezing, or vomiting. °· A break or cut in the PICC. It is important not to use scissors near the PICC. °· Nerve or tendon irritation or injury during PICC insertion. °How to take care of your PICC °Preventing problems °· You and any caregivers should wash your hands often with soap. Wash hands: °? Before touching the PICC line or the infusion device. °? Before changing a bandage (dressing). °· Flush the PICC as told by your  health care provider. Let your health care provider know right away if the PICC is hard to flush or does not flush. Do not use force to flush the PICC. °· Do not use a syringe that is less than 10 mL to flush the PICC. °· Avoid blood pressure checks on the arm in which the PICC is placed. °· Never pull or tug on the PICC. °· Do not take the PICC out yourself. Only a trained clinical professional should remove the PICC. °· Use clean and sterile supplies only. Keep the supplies in a dry place. Do not reuse needles, syringes, or any other supplies. Doing that can lead to infection. °· Keep pets and children away from your PICC line. °· Check the PICC insertion site every day for signs of infection. Check for: °? Leakage. °? Redness, swelling, or pain. °? Fluid or blood. °? Warmth. °? Pus or a bad smell. °PICC dressing care °· Keep your PICC bandage (dressing) clean and dry to prevent infection. °· Do not take baths, swim, or use a hot tub until your health care provider approves. Ask your health care provider if you can take showers. You may only be allowed to take sponge baths for bathing. When you are allowed to shower: °? Ask your health care provider to teach you how to wrap the PICC line. °? Cover the PICC line with clear plastic wrap and tape to keep it dry while showering. °· Follow instructions from your health care provider   about how to take care of your insertion site and dressing. Make sure you: °? Wash your hands with soap and water before you change your bandage (dressing). If soap and water are not available, use hand sanitizer. °? Change your dressing as told by your health care provider. °? Leave stitches (sutures), skin glue, or adhesive strips in place. These skin closures may need to stay in place for 2 weeks or longer. If adhesive strip edges start to loosen and curl up, you may trim the loose edges. Do not remove adhesive strips completely unless your health care provider tells you to do  that. °· Change your PICC dressing if it becomes loose or wet. °General instructions ° °· Carry your PICC identification card or wear a medical alert bracelet at all times. °· Keep the tube clamped at all times, unless it is being used. °· Carry a smooth-edge clamp with you at all times to place on the tube if it breaks. °· Do not use scissors or sharp objects near the tube. °· You may bend your arm and move it freely. If your PICC is near or at the bend of your elbow, avoid activity with repeated motion at the elbow. °· Avoid lifting heavy objects as told by your health care provider. °· Keep all follow-up visits as told by your health care provider. This is important. °Disposal of supplies °· Throw away any syringes in a disposal container that is meant for sharp items (sharps container). You can buy a sharps container from a pharmacy, or you can make one by using an empty hard plastic bottle with a cover. °· Place any used dressings or infusion bags into a plastic bag. Throw that bag in the trash. °Contact a health care provider if: °· You have pain in your arm, ear, face, or teeth. °· You have a fever or chills. °· You have redness, swelling, or pain around the insertion site. °· You have fluid or blood coming from the insertion site. °· Your insertion site feels warm to the touch. °· You have pus or a bad smell coming from the insertion site. °· Your skin feels hard and raised around the insertion site. °Get help right away if: °· Your PICC is accidentally pulled all the way out. If this happens, cover the insertion site with a bandage or gauze dressing. Do not throw the PICC away. Your health care provider will need to check it. °· Your PICC was tugged or pulled and has partially come out. Do not  push the PICC back in. °· You cannot flush the PICC, it is hard to flush, or the PICC leaks around the insertion site when it is flushed. °· You hear a "flushing" sound when the PICC is flushed. °· You feel your  heart racing or skipping beats. °· There is a hole or tear in the PICC. °· You have swelling in the arm in which the PICC was inserted. °· You have a red streak going up your arm from where the PICC was inserted. °Summary °· A peripherally inserted central catheter (PICC) is a long, thin, flexible tube (catheter) that is inserted into a vein in the upper arm. °· The PICC is inserted using a sterile technique by a specially trained nurse or physician. Only a trained clinical professional should remove it. °· Keep your PICC identification card with you at all times. °· Avoid blood pressure checks on the arm in which the PICC is placed. °· If cared for   properly, a PICC can remain in place for several months. Having a PICC can also allow a person to go home from the hospital sooner. °This information is not intended to replace advice given to you by your health care provider. Make sure you discuss any questions you have with your health care provider. °Document Released: 01/14/2003 Document Revised: 06/22/2017 Document Reviewed: 08/12/2016 °Elsevier Patient Education © 2020 Elsevier Inc. ° °

## 2019-05-26 ENCOUNTER — Other Ambulatory Visit: Payer: Self-pay

## 2019-05-26 ENCOUNTER — Inpatient Hospital Stay (HOSPITAL_BASED_OUTPATIENT_CLINIC_OR_DEPARTMENT_OTHER): Payer: BC Managed Care – PPO | Admitting: Nurse Practitioner

## 2019-05-26 ENCOUNTER — Encounter: Payer: Self-pay | Admitting: Nurse Practitioner

## 2019-05-26 ENCOUNTER — Telehealth: Payer: Self-pay | Admitting: Oncology

## 2019-05-26 ENCOUNTER — Inpatient Hospital Stay: Payer: BC Managed Care – PPO

## 2019-05-26 ENCOUNTER — Encounter: Payer: Self-pay | Admitting: *Deleted

## 2019-05-26 ENCOUNTER — Inpatient Hospital Stay: Payer: BC Managed Care – PPO | Attending: Oncology

## 2019-05-26 VITALS — BP 108/73 | HR 96 | Temp 98.7°F | Resp 18 | Ht 66.0 in | Wt 138.8 lb

## 2019-05-26 DIAGNOSIS — Z79899 Other long term (current) drug therapy: Secondary | ICD-10-CM | POA: Diagnosis not present

## 2019-05-26 DIAGNOSIS — D649 Anemia, unspecified: Secondary | ICD-10-CM | POA: Insufficient documentation

## 2019-05-26 DIAGNOSIS — E039 Hypothyroidism, unspecified: Secondary | ICD-10-CM | POA: Diagnosis not present

## 2019-05-26 DIAGNOSIS — Z95828 Presence of other vascular implants and grafts: Secondary | ICD-10-CM

## 2019-05-26 DIAGNOSIS — D696 Thrombocytopenia, unspecified: Secondary | ICD-10-CM

## 2019-05-26 DIAGNOSIS — C9110 Chronic lymphocytic leukemia of B-cell type not having achieved remission: Secondary | ICD-10-CM

## 2019-05-26 DIAGNOSIS — D801 Nonfamilial hypogammaglobulinemia: Secondary | ICD-10-CM | POA: Insufficient documentation

## 2019-05-26 LAB — CBC WITH DIFFERENTIAL (CANCER CENTER ONLY)
Abs Immature Granulocytes: 0 10*3/uL (ref 0.00–0.07)
Basophils Absolute: 0 10*3/uL (ref 0.0–0.1)
Basophils Relative: 0 %
Eosinophils Absolute: 0.1 10*3/uL (ref 0.0–0.5)
Eosinophils Relative: 2 %
HCT: 25.2 % — ABNORMAL LOW (ref 36.0–46.0)
Hemoglobin: 8.6 g/dL — ABNORMAL LOW (ref 12.0–15.0)
Immature Granulocytes: 0 %
Lymphocytes Relative: 79 %
Lymphs Abs: 2.3 10*3/uL (ref 0.7–4.0)
MCH: 30.2 pg (ref 26.0–34.0)
MCHC: 34.1 g/dL (ref 30.0–36.0)
MCV: 88.4 fL (ref 80.0–100.0)
Monocytes Absolute: 0.3 10*3/uL (ref 0.1–1.0)
Monocytes Relative: 9 %
Neutro Abs: 0.3 10*3/uL — CL (ref 1.7–7.7)
Neutrophils Relative %: 10 %
Platelet Count: 31 10*3/uL — ABNORMAL LOW (ref 150–400)
RBC: 2.85 MIL/uL — ABNORMAL LOW (ref 3.87–5.11)
RDW: 13.4 % (ref 11.5–15.5)
WBC Count: 2.9 10*3/uL — ABNORMAL LOW (ref 4.0–10.5)
nRBC: 0 % (ref 0.0–0.2)

## 2019-05-26 LAB — SAMPLE TO BLOOD BANK

## 2019-05-26 MED ORDER — LORAZEPAM 0.5 MG PO TABS: 0.5000 mg | ORAL_TABLET | Freq: Two times a day (BID) | ORAL | 0 refills | Status: DC

## 2019-05-26 MED ORDER — HEPARIN SOD (PORK) LOCK FLUSH 100 UNIT/ML IV SOLN
500.0000 [IU] | Freq: Once | INTRAVENOUS | Status: AC | PRN
  Administered 2019-05-26: 250 [IU]
  Filled 2019-05-26: qty 5

## 2019-05-26 MED ORDER — SODIUM CHLORIDE 0.9% FLUSH
10.0000 mL | INTRAVENOUS | Status: DC | PRN
  Administered 2019-05-26: 10 mL
  Filled 2019-05-26: qty 10

## 2019-05-26 NOTE — Progress Notes (Addendum)
[place] OFFICE PROGRESS NOTE   Diagnosis: CLL, aplastic anemia  INTERVAL HISTORY:   Meagan Walsh returns as scheduled.  She was transfused a unit of blood on 05/22/2019.  She is feeling "a little weak" today.  No fever.  No bleeding.  Bruising overall is better.  She intermittently vomits about 1 hour after taking cyclosporine.  She has tried Compazine and Zofran 30 minutes before the dose.  Objective:  Vital signs in last 24 hours:  Blood pressure 108/73, pulse 96, temperature 98.7 F (37.1 C), temperature source Temporal, resp. rate 18, height 5' 6" (1.676 m), weight 138 lb 12.8 oz (63 kg), SpO2 100 %.    HEENT: No thrush.  Gums with some hypertrophy, erythema.  No bleeding noted.  Small ecchymosis right buccal mucosa. GI: Abdomen soft and nontender.  No hepatosplenomegaly. Vascular: No leg edema. Neuro: Alert and oriented.  PICC site without erythema.  Lab Results:  Lab Results  Component Value Date   WBC 2.9 (L) 05/26/2019   HGB 8.6 (L) 05/26/2019   HCT 25.2 (L) 05/26/2019   MCV 88.4 05/26/2019   PLT 31 (L) 05/26/2019   NEUTROABS PENDING 05/26/2019    Imaging:  No results found.  Medications: I have reviewed the patient's current medications.  Assessment/Plan: 1. 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 Mild splenomegaly.  Ultrasound-guided biopsy of the left inguinal lymph node 09/18/2017, slightly "purulent "fluid aspirated, core biopsy is consistent with an atypical lymphoid proliferation-extensive necrosis with surrounding epithelioid histiocytes, limited intact lymphoid tissue involved with CLL  Incisional biopsy of a necrotic/purulent left inguinal lymph node  on 10/01/2017-extensive necrosis with granulomatous inflammation, small amount of viable lymphoid tissue involved with CLL, AFB and fungal stains negative  Peripheral blood FISH analysis 02/05/2018-deletion 13q14, no evidence of p53 (17p13) deletion, no evidence of 11q22deletion  Bone marrow biopsy 02/26/2018-hypercellular marrow with extensive involvement by CLL, lymphocytes represent85% of all cells  Ibrutinib initiated 04/03/2018  Ibrutinib placed on hold 04/11/2018 due to onset of arthralgias  Ibrutinib resumed 04/16/2018, discontinued 04/25/2018 secondary to severe arthralgias/arthritis  Ibrutinib resumed at a dose of 140 mg daily 05/03/2018  Ibrutinib dose adjusted to 140 mg alternating with 231m 06/25/2018  Ibrutinib discontinued 07/03/2018 secondary to severe arthralgias  Acalabrutinib 08/16/2018, discontinued 11/15/2018 secondary to persistent severe transfusion dependent anemia and neutropenia/thrombocytopenia, last dose 11/14/2018  Bone marrow biopsy 11/21/2018-decreased cellularity, involvement by CLL, decreased erythroid and granulocytic precursors, decreased megakaryocytes  Cycle 1 rituximab 12/06/2018  Bone marrow biopsy 12/24/2018 at [facility]-hypocellular bone marrow (10%) involved by CLL, representing 50% of marrow cellularity; markedly decreased trilineage hematopoiesis including essentially absent erythropoiesis  Bone marrow biopsy 03/06/2027 [facility]-hypocellular bone marrow (20%) with marked reduction of maturing hematopoietic elements; numerous lymphoid aggregates consistent with involvement by CLL, representing approximately 50% of marrow cellularity by flow cytometry  [facility] followed by prednisone taper  2.Hypothyroidism 3.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 Augmentin prescribed. 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 Pancytopenia secondary to CLL and a hypocellular bone marrow  G-CSF and Nplate started 4/32/7614, G-CSF changed to daily beginning 12/17/2018; G-CSF discontinued 12/31/2018, last Nplate 01/15/2019  Began prednisone 60 mg daily 01/11/19, tolerating moderately well except jitteriness, irritability, and difficulty sleeping. Tapered to 40 mg daily x1 week starting 01/24/19, reduce by 10 mg each week until discontinued. Prednisone discontinued 02/20/2019.  Promacta started 01/20/2019, dose increased to 100 mg daily 02/04/2019; dose increased to 150 mg daily 02/21/2019  IVIG daily for 2 days beginning 02/25/2019   9. Severe headache and nausea/vomiting 02/27/2019-likely related to IVIG therapy,resolved  Disposition: Meagan Walsh appears stable.  We reviewed the CBC from today.  Hemoglobin improved following the recent blood transfusion.  Platelet count overall stable.  ANC decreased at 0.3.  She understands to contact the office with fever, chills, other signs of infection, bleeding.  She will return for repeat labs 05/30/2019.  For the nausea following cyclosporine she will try Ativan 0.5 mg 30 minutes before the dose.  She understands she should not take Xanax and Ativan.  She will return for lab and follow-up in 1 week.  She will contact the office in the interim with any problems.  Patient seen with Dr. Benay Spice.    Ned Card ANP/GNP-BC   05/26/2019  10:10 AM  This was a shared visit with Ned Card.  The platelets are stable, but the [place] is lower and she continues to require red cell transfusion support.  The cyclosporine level was in the low end of the therapeutic range ast week and she has been vomiting  after some cyclosporine doses. The fall in the [facility] may be related to a subtherapeutic cyclosporine level.  We check a cyclosporine level today.  I communicated with Dr. Prince Solian in the [place].  The cyclosporine dose will be adjusted as indicated.  We will consider adding Promacta if the cyclosporine level is not low.  Ms. Muckey will return for a CBC later this week.  She will be scheduled for an office visit in 1 week.  We will hold on resuming antibacterial and antifungal prophylaxis for now.  She will begin a trial of lorazepam for nausea.  [name], MD

## 2019-05-26 NOTE — Telephone Encounter (Signed)
Scheduled per 11/02 los, patient will be notified of appointments on my chart.

## 2019-05-27 ENCOUNTER — Telehealth: Payer: Self-pay | Admitting: *Deleted

## 2019-05-27 NOTE — Telephone Encounter (Signed)
Called patient back and informed her that per [facility] we should get the results tomorrow. She was not aware that NP sent the lorazepam script in yesterday. She will pick this up today. Headache is better after using one dose of the sumatriptan nasal spray.

## 2019-05-27 NOTE — Telephone Encounter (Addendum)
Patient left VM asking if there were cyclosporin results yet and asking for a new nausea medication. Reports she vomited last night at 0300 and has a headache today. Has had no energy over the past couple days. Attempted to call her back at 1352: no answer. Noted that NP ordered ativan bid prn yesterday.

## 2019-05-27 NOTE — Telephone Encounter (Signed)
Dental office called to confirm OK to proceed with her dental cleaning tomorrow and need OK note faxed to [phone]. Per Dr. Benay Spice: Counts this week are too low for dental procedure. Office notified. Will send script w/patient when she can have her cleaning next upon recovery of her WBC. [place] notified as wel.

## 2019-05-28 ENCOUNTER — Encounter: Payer: Self-pay | Admitting: *Deleted

## 2019-05-28 ENCOUNTER — Other Ambulatory Visit: Payer: Self-pay | Admitting: Nurse Practitioner

## 2019-05-28 DIAGNOSIS — D649 Anemia, unspecified: Secondary | ICD-10-CM

## 2019-05-28 DIAGNOSIS — C9110 Chronic lymphocytic leukemia of B-cell type not having achieved remission: Secondary | ICD-10-CM

## 2019-05-28 LAB — CYCLOSPORINE: Cyclosporine, LabCorp: 127 ng/mL (ref 100–400)

## 2019-05-28 NOTE — Progress Notes (Signed)
Faxed cyclosporin results to att: Joellen Jersey, PharmD at [facility] [phone] and called and left VM at [phone] that results are en route. Dr. Benay Spice notified as well. Notified patient of result and to expect call from [facility] regarding increased dosing on the cyclosporin. Requested she call back and let us know when she needs a refill and where to send it.

## 2019-05-29 ENCOUNTER — Telehealth: Payer: Self-pay | Admitting: *Deleted

## 2019-05-29 ENCOUNTER — Other Ambulatory Visit: Payer: Self-pay | Admitting: *Deleted

## 2019-05-29 DIAGNOSIS — D619 Aplastic anemia, unspecified: Principal | ICD-10-CM

## 2019-05-29 MED ORDER — CYCLOSPORINE MODIFIED 25 MG CAPSULE
ORAL_CAPSULE | Freq: Every day | ORAL | 2 refills | 60.00000 days | Status: CP
Start: 2019-05-29 — End: 2020-05-28

## 2019-05-29 MED ORDER — CYCLOSPORINE MODIFIED 100 MG CAPSULE
ORAL_CAPSULE | Freq: Two times a day (BID) | ORAL | 2 refills | 30.00000 days | Status: CP
Start: 2019-05-29 — End: 2020-05-28

## 2019-05-29 NOTE — Telephone Encounter (Signed)
Meagan Walsh called back and was informed of new dosing of 250 mg bid on her cyclosporin and [facility] at 3pm. Instructed her to take 50 mg now to catch up and then start 250 mg bid this evening (she still has some of the liquid form to get the 50 mg).

## 2019-05-29 NOTE — Telephone Encounter (Signed)
[facility] sent script for cyclosporin 100 mg and 25 mg capsules w/dosing directions for 250 mg bid. They have been unsuccessful in reaching her (mailbox full). Confirmed w/pharmacy the drug will be ready for pick up tomorrow after 3 pm (was not in stock).  Attempted to reach patient w/no answer and mailbox full. Called her husband's number and left VM to have patient call our office asap.

## 2019-05-30 ENCOUNTER — Other Ambulatory Visit: Payer: Self-pay

## 2019-05-30 ENCOUNTER — Inpatient Hospital Stay: Payer: BC Managed Care – PPO

## 2019-05-30 ENCOUNTER — Other Ambulatory Visit: Payer: Self-pay | Admitting: *Deleted

## 2019-05-30 ENCOUNTER — Telehealth: Payer: Self-pay | Admitting: *Deleted

## 2019-05-30 DIAGNOSIS — D649 Anemia, unspecified: Secondary | ICD-10-CM

## 2019-05-30 DIAGNOSIS — D801 Nonfamilial hypogammaglobulinemia: Secondary | ICD-10-CM | POA: Diagnosis not present

## 2019-05-30 DIAGNOSIS — E039 Hypothyroidism, unspecified: Secondary | ICD-10-CM | POA: Diagnosis not present

## 2019-05-30 DIAGNOSIS — C9110 Chronic lymphocytic leukemia of B-cell type not having achieved remission: Secondary | ICD-10-CM

## 2019-05-30 DIAGNOSIS — Z79899 Other long term (current) drug therapy: Secondary | ICD-10-CM | POA: Diagnosis not present

## 2019-05-30 DIAGNOSIS — Z95828 Presence of other vascular implants and grafts: Secondary | ICD-10-CM

## 2019-05-30 DIAGNOSIS — D696 Thrombocytopenia, unspecified: Secondary | ICD-10-CM

## 2019-05-30 LAB — CBC WITH DIFFERENTIAL (CANCER CENTER ONLY)
Abs Immature Granulocytes: 0 10*3/uL (ref 0.00–0.07)
Basophils Absolute: 0 10*3/uL (ref 0.0–0.1)
Basophils Relative: 1 %
Eosinophils Absolute: 0.1 10*3/uL (ref 0.0–0.5)
Eosinophils Relative: 3 %
HCT: 22.2 % — ABNORMAL LOW (ref 36.0–46.0)
Hemoglobin: 7.6 g/dL — ABNORMAL LOW (ref 12.0–15.0)
Immature Granulocytes: 0 %
Lymphocytes Relative: 77 %
Lymphs Abs: 2.2 10*3/uL (ref 0.7–4.0)
MCH: 30.2 pg (ref 26.0–34.0)
MCHC: 34.2 g/dL (ref 30.0–36.0)
MCV: 88.1 fL (ref 80.0–100.0)
Monocytes Absolute: 0.4 10*3/uL (ref 0.1–1.0)
Monocytes Relative: 13 %
Neutro Abs: 0.2 10*3/uL — CL (ref 1.7–7.7)
Neutrophils Relative %: 6 %
Platelet Count: 25 10*3/uL — ABNORMAL LOW (ref 150–400)
RBC: 2.52 MIL/uL — ABNORMAL LOW (ref 3.87–5.11)
RDW: 13.1 % (ref 11.5–15.5)
WBC Count: 2.8 10*3/uL — ABNORMAL LOW (ref 4.0–10.5)
nRBC: 0 % (ref 0.0–0.2)

## 2019-05-30 LAB — VITAMIN B12: Vitamin B-12: 355 pg/mL (ref 180–914)

## 2019-05-30 LAB — PREPARE RBC (CROSSMATCH)

## 2019-05-30 LAB — SAMPLE TO BLOOD BANK

## 2019-05-30 MED ORDER — HEPARIN SOD (PORK) LOCK FLUSH 100 UNIT/ML IV SOLN
250.0000 [IU] | INTRAVENOUS | Status: AC | PRN
  Administered 2019-05-30: 250 [IU]
  Filled 2019-05-30: qty 5

## 2019-05-30 MED ORDER — SODIUM CHLORIDE 0.9% FLUSH
10.0000 mL | INTRAVENOUS | Status: AC | PRN
  Administered 2019-05-30: 10 mL
  Filled 2019-05-30: qty 10

## 2019-05-30 MED ORDER — LEVOFLOXACIN 500 MG PO TABS: 500.0000 mg | ORAL_TABLET | Freq: Every day | ORAL | 0 refills | Status: DC

## 2019-05-30 MED ORDER — SODIUM CHLORIDE 0.9% IV SOLUTION
250.0000 mL | Freq: Once | INTRAVENOUS | Status: AC
  Administered 2019-05-30: 250 mL via INTRAVENOUS
  Filled 2019-05-30: qty 250

## 2019-05-30 MED ORDER — HEPARIN SOD (PORK) LOCK FLUSH 100 UNIT/ML IV SOLN
500.0000 [IU] | Freq: Once | INTRAVENOUS | Status: AC | PRN
  Administered 2019-05-30: 250 [IU]
  Filled 2019-05-30: qty 5

## 2019-05-30 MED ORDER — SODIUM CHLORIDE 0.9% FLUSH
10.0000 mL | INTRAVENOUS | Status: DC | PRN
  Administered 2019-05-30: 12:00:00 10 mL
  Filled 2019-05-30: qty 10

## 2019-05-30 NOTE — Progress Notes (Signed)
Hgb 7.6 today. Patient admits to feeling fatigued of late. She would like 1 unit of blood. Blood bank notified and will prepare her transfusion today.

## 2019-05-30 NOTE — Progress Notes (Signed)
CRITICAL VALUE STICKER  CRITICAL VALUE: ANC 0.2 & Hgb 7.6  RECEIVER (on-site recipient of call): Manuela Schwartz, [place] NOTIFIED: 05/30/2019 @ 12:25  MESSENGER (representative from lab): Rhnda  MD NOTIFIED: Dr. Benay Spice  TIME OF NOTIFICATION: 12:30  RESPONSE: MD will review w/Dr. Prince Solian

## 2019-05-30 NOTE — Telephone Encounter (Signed)
Error

## 2019-05-30 NOTE — Patient Instructions (Signed)
Blood Transfusion, Adult, Care After This sheet gives you information about how to care for yourself after your procedure. Your doctor may also give you more specific instructions. If you have problems or questions, contact your doctor. Follow these instructions at home:   Take over-the-counter and prescription medicines only as told by your doctor.  Go back to your normal activities as told by your doctor.  Follow instructions from your doctor about how to take care of the area where an IV tube was put into your vein (insertion site). Make sure you: ? Wash your hands with soap and water before you change your bandage (dressing). If there is no soap and water, use hand sanitizer. ? Change your bandage as told by your doctor.  Check your IV insertion site every day for signs of infection. Check for: ? More redness, swelling, or pain. ? More fluid or blood. ? Warmth. ? Pus or a bad smell. Contact a doctor if:  You have more redness, swelling, or pain around the IV insertion site.  You have more fluid or blood coming from the IV insertion site.  Your IV insertion site feels warm to the touch.  You have pus or a bad smell coming from the IV insertion site.  Your pee (urine) turns pink, red, or brown.  You feel weak after doing your normal activities. Get help right away if:  You have signs of a serious allergic or body defense (immune) system reaction, including: ? Itchiness. ? Hives. ? Trouble breathing. ? Anxiety. ? Pain in your chest or lower back. ? Fever, flushing, and chills. ? Fast pulse. ? Rash. ? Watery poop (diarrhea). ? Throwing up (vomiting). ? Dark pee. ? Serious headache. ? Dizziness. ? Stiff neck. ? Yellow color in your face or the white parts of your eyes (jaundice). Summary  After a blood transfusion, return to your normal activities as told by your doctor.  Every day, check for signs of infection where the IV tube was put into your vein.  Some  signs of infection are warm skin, more redness and pain, more fluid or blood, and pus or a bad smell where the needle went in.  Contact your doctor if you feel weak or have any unusual symptoms. This information is not intended to replace advice given to you by your health care provider. Make sure you discuss any questions you have with your health care provider. Document Released: 07/31/2014 Document Revised: 11/14/2017 Document Reviewed: 03/03/2016 Elsevier Patient Education  2020 Elsevier Inc.  

## 2019-05-30 NOTE — Progress Notes (Signed)
Per Dr. Benay Spice: Resume Levaquin 500 mg daily since INR is down again. Patient notified and agrees.

## 2019-05-31 LAB — BPAM RBC
Blood Product Expiration Date: 202012042359
ISSUE DATE / TIME: 202011061335
Unit Type and Rh: 5100

## 2019-05-31 LAB — TYPE AND SCREEN
ABO/RH(D): O POS
Antibody Screen: NEGATIVE
Unit division: 0

## 2019-06-02 ENCOUNTER — Telehealth: Payer: Self-pay | Admitting: *Deleted

## 2019-06-02 ENCOUNTER — Inpatient Hospital Stay: Payer: BC Managed Care – PPO

## 2019-06-02 ENCOUNTER — Inpatient Hospital Stay (HOSPITAL_BASED_OUTPATIENT_CLINIC_OR_DEPARTMENT_OTHER): Payer: BC Managed Care – PPO | Admitting: Nurse Practitioner

## 2019-06-02 ENCOUNTER — Other Ambulatory Visit: Payer: Self-pay

## 2019-06-02 ENCOUNTER — Encounter: Payer: Self-pay | Admitting: Nurse Practitioner

## 2019-06-02 VITALS — BP 96/72 | HR 97 | Temp 98.7°F | Resp 17 | Ht 66.0 in | Wt 138.1 lb

## 2019-06-02 DIAGNOSIS — D649 Anemia, unspecified: Secondary | ICD-10-CM

## 2019-06-02 DIAGNOSIS — C9110 Chronic lymphocytic leukemia of B-cell type not having achieved remission: Secondary | ICD-10-CM

## 2019-06-02 DIAGNOSIS — Z95828 Presence of other vascular implants and grafts: Secondary | ICD-10-CM

## 2019-06-02 DIAGNOSIS — E039 Hypothyroidism, unspecified: Secondary | ICD-10-CM | POA: Diagnosis not present

## 2019-06-02 DIAGNOSIS — D801 Nonfamilial hypogammaglobulinemia: Secondary | ICD-10-CM | POA: Diagnosis not present

## 2019-06-02 DIAGNOSIS — Z79899 Other long term (current) drug therapy: Secondary | ICD-10-CM | POA: Diagnosis not present

## 2019-06-02 DIAGNOSIS — D696 Thrombocytopenia, unspecified: Secondary | ICD-10-CM | POA: Diagnosis not present

## 2019-06-02 DIAGNOSIS — D619 Aplastic anemia, unspecified: Principal | ICD-10-CM

## 2019-06-02 LAB — CBC WITH DIFFERENTIAL (CANCER CENTER ONLY)
Abs Immature Granulocytes: 0 10*3/uL (ref 0.00–0.07)
Basophils Absolute: 0 10*3/uL (ref 0.0–0.1)
Basophils Relative: 1 %
Eosinophils Absolute: 0.1 10*3/uL (ref 0.0–0.5)
Eosinophils Relative: 3 %
HCT: 27.4 % — ABNORMAL LOW (ref 36.0–46.0)
Hemoglobin: 9.5 g/dL — ABNORMAL LOW (ref 12.0–15.0)
Immature Granulocytes: 0 %
Lymphocytes Relative: 76 %
Lymphs Abs: 2.7 10*3/uL (ref 0.7–4.0)
MCH: 29.6 pg (ref 26.0–34.0)
MCHC: 34.7 g/dL (ref 30.0–36.0)
MCV: 85.4 fL (ref 80.0–100.0)
Monocytes Absolute: 0.5 10*3/uL (ref 0.1–1.0)
Monocytes Relative: 15 %
Neutro Abs: 0.2 10*3/uL — CL (ref 1.7–7.7)
Neutrophils Relative %: 5 %
Platelet Count: 22 10*3/uL — ABNORMAL LOW (ref 150–400)
RBC: 3.21 MIL/uL — ABNORMAL LOW (ref 3.87–5.11)
RDW: 13.1 % (ref 11.5–15.5)
WBC Count: 3.6 10*3/uL — ABNORMAL LOW (ref 4.0–10.5)
nRBC: 0 % (ref 0.0–0.2)

## 2019-06-02 MED ORDER — SODIUM CHLORIDE 0.9% FLUSH
10.0000 mL | INTRAVENOUS | Status: DC | PRN
  Administered 2019-06-02: 10 mL
  Filled 2019-06-02: qty 10

## 2019-06-02 MED ORDER — HEPARIN SOD (PORK) LOCK FLUSH 100 UNIT/ML IV SOLN
500.0000 [IU] | Freq: Once | INTRAVENOUS | Status: AC | PRN
  Administered 2019-06-02: 500 [IU]
  Filled 2019-06-02: qty 5

## 2019-06-02 NOTE — Patient Instructions (Signed)
PICC Home Care Guide ° °A peripherally inserted central catheter (PICC) is a form of IV access that allows medicines and IV fluids to be quickly distributed throughout the body. The PICC is a long, thin, flexible tube (catheter) that is inserted into a vein in the upper arm. The catheter ends in a large vein in the chest (superior vena cava, or SVC). After the PICC is inserted, a chest X-ray may be done to make sure that it is in the correct place. °A PICC may be placed for different reasons, such as: °· To give medicines and liquid nutrition. °· To give IV fluids and blood products. °· If there is trouble placing a peripheral intravenous (PIV) catheter. °If taken care of properly, a PICC can remain in place for several months. Having a PICC can also allow a person to go home from the hospital sooner. Medicine and PICC care can be managed at home by a family member, caregiver, or home health care team. °What are the risks? °Generally, having a PICC is safe. However, problems may occur, including: °· A blood clot (thrombus) forming in or at the tip of the PICC. °· A blood clot forming in a vein (deep vein thrombosis) or traveling to the lung (pulmonary embolism). °· Inflammation of the vein (phlebitis) in which the PICC is placed. °· Infection. Central line associated blood stream infection (CLABSI) is a serious infection that often requires hospitalization. °· PICC movement (malposition). The PICC tip may move from its original position due to excessive physical activity, forceful coughing, sneezing, or vomiting. °· A break or cut in the PICC. It is important not to use scissors near the PICC. °· Nerve or tendon irritation or injury during PICC insertion. °How to take care of your PICC °Preventing problems °· You and any caregivers should wash your hands often with soap. Wash hands: °? Before touching the PICC line or the infusion device. °? Before changing a bandage (dressing). °· Flush the PICC as told by your  health care provider. Let your health care provider know right away if the PICC is hard to flush or does not flush. Do not use force to flush the PICC. °· Do not use a syringe that is less than 10 mL to flush the PICC. °· Avoid blood pressure checks on the arm in which the PICC is placed. °· Never pull or tug on the PICC. °· Do not take the PICC out yourself. Only a trained clinical professional should remove the PICC. °· Use clean and sterile supplies only. Keep the supplies in a dry place. Do not reuse needles, syringes, or any other supplies. Doing that can lead to infection. °· Keep pets and children away from your PICC line. °· Check the PICC insertion site every day for signs of infection. Check for: °? Leakage. °? Redness, swelling, or pain. °? Fluid or blood. °? Warmth. °? Pus or a bad smell. °PICC dressing care °· Keep your PICC bandage (dressing) clean and dry to prevent infection. °· Do not take baths, swim, or use a hot tub until your health care provider approves. Ask your health care provider if you can take showers. You may only be allowed to take sponge baths for bathing. When you are allowed to shower: °? Ask your health care provider to teach you how to wrap the PICC line. °? Cover the PICC line with clear plastic wrap and tape to keep it dry while showering. °· Follow instructions from your health care provider   about how to take care of your insertion site and dressing. Make sure you: °? Wash your hands with soap and water before you change your bandage (dressing). If soap and water are not available, use hand sanitizer. °? Change your dressing as told by your health care provider. °? Leave stitches (sutures), skin glue, or adhesive strips in place. These skin closures may need to stay in place for 2 weeks or longer. If adhesive strip edges start to loosen and curl up, you may trim the loose edges. Do not remove adhesive strips completely unless your health care provider tells you to do  that. °· Change your PICC dressing if it becomes loose or wet. °General instructions ° °· Carry your PICC identification card or wear a medical alert bracelet at all times. °· Keep the tube clamped at all times, unless it is being used. °· Carry a smooth-edge clamp with you at all times to place on the tube if it breaks. °· Do not use scissors or sharp objects near the tube. °· You may bend your arm and move it freely. If your PICC is near or at the bend of your elbow, avoid activity with repeated motion at the elbow. °· Avoid lifting heavy objects as told by your health care provider. °· Keep all follow-up visits as told by your health care provider. This is important. °Disposal of supplies °· Throw away any syringes in a disposal container that is meant for sharp items (sharps container). You can buy a sharps container from a pharmacy, or you can make one by using an empty hard plastic bottle with a cover. °· Place any used dressings or infusion bags into a plastic bag. Throw that bag in the trash. °Contact a health care provider if: °· You have pain in your arm, ear, face, or teeth. °· You have a fever or chills. °· You have redness, swelling, or pain around the insertion site. °· You have fluid or blood coming from the insertion site. °· Your insertion site feels warm to the touch. °· You have pus or a bad smell coming from the insertion site. °· Your skin feels hard and raised around the insertion site. °Get help right away if: °· Your PICC is accidentally pulled all the way out. If this happens, cover the insertion site with a bandage or gauze dressing. Do not throw the PICC away. Your health care provider will need to check it. °· Your PICC was tugged or pulled and has partially come out. Do not  push the PICC back in. °· You cannot flush the PICC, it is hard to flush, or the PICC leaks around the insertion site when it is flushed. °· You hear a "flushing" sound when the PICC is flushed. °· You feel your  heart racing or skipping beats. °· There is a hole or tear in the PICC. °· You have swelling in the arm in which the PICC was inserted. °· You have a red streak going up your arm from where the PICC was inserted. °Summary °· A peripherally inserted central catheter (PICC) is a long, thin, flexible tube (catheter) that is inserted into a vein in the upper arm. °· The PICC is inserted using a sterile technique by a specially trained nurse or physician. Only a trained clinical professional should remove it. °· Keep your PICC identification card with you at all times. °· Avoid blood pressure checks on the arm in which the PICC is placed. °· If cared for   properly, a PICC can remain in place for several months. Having a PICC can also allow a person to go home from the hospital sooner. °This information is not intended to replace advice given to you by your health care provider. Make sure you discuss any questions you have with your health care provider. °Document Released: 01/14/2003 Document Revised: 06/22/2017 Document Reviewed: 08/12/2016 °Elsevier Patient Education © 2020 Elsevier Inc. ° °

## 2019-06-02 NOTE — Telephone Encounter (Signed)
Lab [facility] called in critical lab value ANC 0.2.  Made provider Ned Card NP, aware

## 2019-06-02 NOTE — Progress Notes (Addendum)
[place] OFFICE PROGRESS NOTE   Diagnosis: CLL, aplastic anemia  INTERVAL HISTORY:   Ms. Horace returns as scheduled.  She was transfused a unit of blood on 05/30/2019.  She remains fatigued.  She continues to have intermittent nausea/vomiting.  Ativan helps at times.  She noted blood on a pillowcase over the weekend.  She attributes this to gum bleeding.  No other bleeding.  No new bruises.  She denies fever.  No significant headaches.  Objective:  Vital signs in last 24 hours:  Blood pressure 96/72, pulse 97, temperature 98.7 F (37.1 C), temperature source Temporal, resp. rate 17, height '5\' 6"'$  (1.676 m), weight 138 lb 1.6 oz (62.6 kg), SpO2 100 %.    HEENT: Gums with scattered erythema/hypertrophy. GI: Abdomen soft and nontender.  No hepatosplenomegaly. Vascular: No leg edema. Neuro: Alert and oriented.  Left upper extremity PICC without erythema.  Lab Results:  Lab Results  Component Value Date   WBC 3.6 (L) 06/02/2019   HGB 9.5 (L) 06/02/2019   HCT 27.4 (L) 06/02/2019   MCV 85.4 06/02/2019   PLT 22 (L) 06/02/2019   NEUTROABS 0.2 (LL) 06/02/2019    Imaging:  No results found.  Medications: I have reviewed the patient's current medications.  Assessment/Plan: 1. 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 Mild splenomegaly.  Ultrasound-guided biopsy of the left inguinal lymph node 09/18/2017, slightly "purulent "fluid aspirated, core biopsy is consistent with an atypical lymphoid proliferation-extensive necrosis with surrounding epithelioid histiocytes, limited intact lymphoid tissue involved with CLL  Incisional biopsy of a necrotic/purulent left inguinal lymph node on  10/01/2017-extensive necrosis with granulomatous inflammation, small amount of viable lymphoid tissue involved with CLL, AFB and fungal stains negative  Peripheral blood FISH analysis 02/05/2018-deletion 13q14, no evidence of p53 (17p13) deletion, no evidence of 11q22deletion  Bone marrow biopsy 02/26/2018-hypercellular marrow with extensive involvement by CLL, lymphocytes represent85% of all cells  Ibrutinib initiated 04/03/2018  Ibrutinib placed on hold 04/11/2018 due to onset of arthralgias  Ibrutinib resumed 04/16/2018, discontinued 04/25/2018 secondary to severe arthralgias/arthritis  Ibrutinib resumed at a dose of 140 mg daily 05/03/2018  Ibrutinib dose adjusted to 140 mg alternating with '280mg'$  06/25/2018  Ibrutinib discontinued 07/03/2018 secondary to severe arthralgias  Acalabrutinib 08/16/2018, discontinued 11/15/2018 secondary to persistent severe transfusion dependent anemia and neutropenia/thrombocytopenia, last dose 11/14/2018  Bone marrow biopsy 11/21/2018-decreased cellularity, involvement by CLL, decreased erythroid and granulocytic precursors, decreased megakaryocytes  Cycle 1 rituximab 12/06/2018  Bone marrow biopsy 12/24/2018 at [facility]-hypocellular bone marrow (10%) involved by CLL, representing 50% of marrow cellularity; markedly decreased trilineage hematopoiesis including essentially absent erythropoiesis  Bone marrow biopsy 03/06/2027 [facility]-hypocellular bone marrow (20%) with marked reduction of maturing hematopoietic elements; numerous lymphoid aggregates consistent with involvement by CLL, representing approximately 50% of marrow cellularity by flow cytometry  ATG/cyclosporine at [facility] beginning 03/19/2019 followed by prednisone taper  2.Hypothyroidism 3.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 Augmentin prescribed. 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 Pancytopenia secondary to CLL and a hypocellular bone marrow  G-CSF and  Nplate started 4/96/5659, G-CSF changed to daily beginning 12/17/2018; G-CSF discontinued 12/31/2018, last Nplate 01/15/2019  Began prednisone 60 mg daily 01/11/19, tolerating moderately well except jitteriness, irritability, and difficulty sleeping. Tapered to 40 mg daily x1 week starting 01/24/19, reduce by 10 mg each week until discontinued. Prednisone discontinued 02/20/2019.  Promacta started 01/20/2019, dose increased to 100 mg daily 02/04/2019; dose increased to 150 mg daily 02/21/2019  IVIG daily for 2 days beginning 02/25/2019   9. Severe headache and nausea/vomiting 02/27/2019-likely related to IVIG therapy,resolved  Disposition: Ms. Houchin appears unchanged.  She continues to have significant pancytopenia.  Hemoglobin better following the recent blood transfusion.  Platelets and neutrophils stable.  She continues to have intermittent nausea/vomiting.  She attributes this to the cyclosporine.  She will increase Ativan to 1 mg 30 minutes prior to each dose.  We will check a chemistry panel and lithium level 06/06/2019.  She will return for lab 06/06/2019.  She will return for lab and follow-up 06/09/2019.  She will contact the office in the interim with any problems.  Patient seen with Dr. Benay Spice.  25 minutes were spent face-to-face at today's visit with the majority of that time involved in counseling/coordination of care.  Ned Card ANP/GNP-BC   06/02/2019  10:57 AM  This was a shared visit with Ned Card.  Ms. Spees has persistent pancytopenia.  The cyclosporine level was increased last week with the hope of improving  the pancytopenia with a higher trough level.  She has intermittent vomiting after taking doses of cyclosporine.  Lorazepam helped partially.  We increased the lorazepam dose today.  We will discuss alternate antiemetic options [facility] and Dr. Prince Solian.  We can consider adding prednisone as an antiemetic.  There is no other apparent explanation for the nausea.  We will check a lithium level.  She is scheduled for a telehealth visit with [facility] tomorrow.   [name], MD

## 2019-06-03 ENCOUNTER — Telehealth
Admit: 2019-06-03 | Discharge: 2019-06-04 | Payer: PRIVATE HEALTH INSURANCE | Attending: Adult Health | Primary: Adult Health

## 2019-06-03 ENCOUNTER — Telehealth: Payer: Self-pay | Admitting: *Deleted

## 2019-06-03 ENCOUNTER — Other Ambulatory Visit: Payer: Self-pay | Admitting: *Deleted

## 2019-06-03 DIAGNOSIS — D619 Aplastic anemia, unspecified: Secondary | ICD-10-CM | POA: Diagnosis not present

## 2019-06-03 DIAGNOSIS — R112 Nausea with vomiting, unspecified: Secondary | ICD-10-CM | POA: Diagnosis not present

## 2019-06-03 DIAGNOSIS — T50905A Adverse effect of unspecified drugs, medicaments and biological substances, initial encounter: Secondary | ICD-10-CM | POA: Diagnosis not present

## 2019-06-03 DIAGNOSIS — F329 Major depressive disorder, single episode, unspecified: Secondary | ICD-10-CM | POA: Diagnosis not present

## 2019-06-03 DIAGNOSIS — C9110 Chronic lymphocytic leukemia of B-cell type not having achieved remission: Secondary | ICD-10-CM | POA: Diagnosis not present

## 2019-06-03 MED ORDER — CYCLOSPORINE MODIFIED (NEORAL) 25 MG PO CAPS: 50.0000 mg | ORAL_CAPSULE | Freq: Two times a day (BID) | ORAL | 11 refills | Status: DC

## 2019-06-03 MED ORDER — CYCLOSPORINE 100 MG PO CAPS: 200.0000 mg | ORAL_CAPSULE | Freq: Two times a day (BID) | ORAL | 1 refills | Status: DC

## 2019-06-03 MED ORDER — POSACONAZOLE 100 MG TABLET,DELAYED RELEASE
ORAL_TABLET | Freq: Every day | ORAL | 2 refills | 30 days | Status: CP
Start: 2019-06-03 — End: ?

## 2019-06-03 MED ORDER — LEVOFLOXACIN 500 MG TABLET
ORAL_TABLET | Freq: Every day | ORAL | 2 refills | 30.00000 days | Status: CP
Start: 2019-06-03 — End: ?

## 2019-06-03 MED ORDER — CYCLOSPORINE MODIFIED 100 MG CAPSULE
ORAL_CAPSULE | Freq: Two times a day (BID) | ORAL | 2 refills | 30.00000 days | Status: CP
Start: 2019-06-03 — End: 2020-06-02

## 2019-06-03 MED FILL — cycloSPORINE 100 MG CAPS: 100 | 30 days supply | Qty: 120 | Fill #0

## 2019-06-03 MED FILL — CYCLOSPORINE MODIFIED 25 MG: 25 | 30 days supply | Qty: 120 | Fill #0

## 2019-06-03 NOTE — Telephone Encounter (Addendum)
Left VM for husband to have patient call today and inform us of the specialty pharmacy her insurance requires for her to obtain her cyclosporin medication.  Patient called back and provided her new insurance card ID [id number] which is still [employer] and states that [place] is in her specialty network. [facility] and instructed to cancel prior refill request and that script has been transferred to another pharmacy. Cyclosporin 100 mg and 25 mg capsules sent to [place].

## 2019-06-04 ENCOUNTER — Telehealth: Payer: Self-pay | Admitting: Oncology

## 2019-06-04 ENCOUNTER — Telehealth: Payer: Self-pay | Admitting: *Deleted

## 2019-06-04 NOTE — Telephone Encounter (Addendum)
Reports the posaconazole [facility] ordered to resume needs prior [facility]. She also reports that she only has a 4 day supply of the cyclosporin 100 mg on hand. Called pharmacy and obtained information needed to complete PA and forwarded to [facility] in managed care. Per Dr. Benay Spice, this is a more broad spectrum antifungal she needs.  Verified w/[facility] that her cyclosporin 100 mg will be mailed out to her today and she should have it by Friday. Patient also reports her gums are tender and red (no bleeding). [facility] instructed her to use baking soda rinses.

## 2019-06-04 NOTE — Telephone Encounter (Signed)
Scheduled per los. Called and spoke with patient. Confirmed appt 

## 2019-06-05 ENCOUNTER — Telehealth: Payer: Self-pay | Admitting: *Deleted

## 2019-06-05 NOTE — Telephone Encounter (Addendum)
Left VM requesting medical records to support her diagnosis. Working on [employer] for [employer]. Faxed last office notes from [facility] and [facility] w/lab results at [place] w/confirmation received. Requested case be processed asap. @ 1330: Called Stephanie w/[employer] and confirmed records were received and case forwarded to pharmacist for review. Should have decision by 06/08/19.  Requested she alert pharmacy review that MD had desired this to start day script was sent. Called [facility] to determine out of pocket cost for #15 tablets of posaconazole and it was over $700. Notified Katie, PharmD at [facility]. She said to increase cyclosporin to 250 mg bid till she starts the antifungal. If PA goes through and it is still cost prohibitive call her back and she will try to get help via manufacturer. Notified Naraly and she agrees to increase cyclosporin to 250 mg bid.

## 2019-06-06 ENCOUNTER — Other Ambulatory Visit: Payer: Self-pay | Admitting: *Deleted

## 2019-06-06 ENCOUNTER — Inpatient Hospital Stay: Payer: BC Managed Care – PPO

## 2019-06-06 ENCOUNTER — Telehealth: Payer: Self-pay | Admitting: *Deleted

## 2019-06-06 ENCOUNTER — Other Ambulatory Visit: Payer: BC Managed Care – PPO

## 2019-06-06 ENCOUNTER — Other Ambulatory Visit: Payer: Self-pay

## 2019-06-06 DIAGNOSIS — D649 Anemia, unspecified: Secondary | ICD-10-CM

## 2019-06-06 DIAGNOSIS — Z95828 Presence of other vascular implants and grafts: Secondary | ICD-10-CM

## 2019-06-06 DIAGNOSIS — D696 Thrombocytopenia, unspecified: Secondary | ICD-10-CM

## 2019-06-06 DIAGNOSIS — D801 Nonfamilial hypogammaglobulinemia: Secondary | ICD-10-CM | POA: Diagnosis not present

## 2019-06-06 DIAGNOSIS — C9110 Chronic lymphocytic leukemia of B-cell type not having achieved remission: Secondary | ICD-10-CM

## 2019-06-06 DIAGNOSIS — E039 Hypothyroidism, unspecified: Secondary | ICD-10-CM | POA: Diagnosis not present

## 2019-06-06 DIAGNOSIS — Z79899 Other long term (current) drug therapy: Secondary | ICD-10-CM | POA: Diagnosis not present

## 2019-06-06 LAB — CMP (CANCER CENTER ONLY)
ALT: 46 U/L — ABNORMAL HIGH (ref 0–44)
AST: 23 U/L (ref 15–41)
Albumin: 3.9 g/dL (ref 3.5–5.0)
Alkaline Phosphatase: 172 U/L — ABNORMAL HIGH (ref 38–126)
Anion gap: 8 (ref 5–15)
BUN: 18 mg/dL (ref 6–20)
CO2: 26 mmol/L (ref 22–32)
Calcium: 9.7 mg/dL (ref 8.9–10.3)
Chloride: 105 mmol/L (ref 98–111)
Creatinine: 0.81 mg/dL (ref 0.44–1.00)
GFR, Est AFR Am: >60 mL/min (ref >60)
GFR, Estimated: >60 mL/min (ref >60)
Glucose, Bld: 106 mg/dL — ABNORMAL HIGH (ref 70–99)
Potassium: 4.5 mmol/L (ref 3.5–5.1)
Sodium: 139 mmol/L (ref 135–145)
Total Bilirubin: 0.7 mg/dL (ref 0.3–1.2)
Total Protein: 5.9 g/dL — ABNORMAL LOW (ref 6.5–8.1)

## 2019-06-06 LAB — CBC WITH DIFFERENTIAL (CANCER CENTER ONLY)
Abs Immature Granulocytes: 0.01 10*3/uL (ref 0.00–0.07)
Basophils Absolute: 0 10*3/uL (ref 0.0–0.1)
Basophils Relative: 0 %
Eosinophils Absolute: 0.1 10*3/uL (ref 0.0–0.5)
Eosinophils Relative: 4 %
HCT: 24 % — ABNORMAL LOW (ref 36.0–46.0)
Hemoglobin: 8.4 g/dL — ABNORMAL LOW (ref 12.0–15.0)
Immature Granulocytes: 0 %
Lymphocytes Relative: 73 %
Lymphs Abs: 2.1 10*3/uL (ref 0.7–4.0)
MCH: 29.3 pg (ref 26.0–34.0)
MCHC: 35 g/dL (ref 30.0–36.0)
MCV: 83.6 fL (ref 80.0–100.0)
Monocytes Absolute: 0.3 10*3/uL (ref 0.1–1.0)
Monocytes Relative: 11 %
Neutro Abs: 0.3 10*3/uL — CL (ref 1.7–7.7)
Neutrophils Relative %: 12 %
Platelet Count: 15 10*3/uL — ABNORMAL LOW (ref 150–400)
RBC: 2.87 MIL/uL — ABNORMAL LOW (ref 3.87–5.11)
RDW: 12.5 % (ref 11.5–15.5)
WBC Count: 2.9 10*3/uL — ABNORMAL LOW (ref 4.0–10.5)
nRBC: 0 % (ref 0.0–0.2)

## 2019-06-06 LAB — LITHIUM LEVEL: Lithium Lvl: 0.56 mmol/L — ABNORMAL LOW (ref 0.60–1.20)

## 2019-06-06 LAB — TYPE AND SCREEN
ABO/RH(D): O POS
Antibody Screen: NEGATIVE

## 2019-06-06 LAB — SAMPLE TO BLOOD BANK

## 2019-06-06 MED ORDER — HEPARIN SOD (PORK) LOCK FLUSH 100 UNIT/ML IV SOLN
500.0000 [IU] | Freq: Once | INTRAVENOUS | Status: AC | PRN
  Administered 2019-06-06: 500 [IU]
  Filled 2019-06-06: qty 5

## 2019-06-06 MED ORDER — SODIUM CHLORIDE 0.9% FLUSH
10.0000 mL | INTRAVENOUS | Status: DC | PRN
  Administered 2019-06-06: 10 mL
  Filled 2019-06-06: qty 10

## 2019-06-06 NOTE — Telephone Encounter (Signed)
CRITICAL VALUE STICKER  CRITICAL VALUE: ANC = 0.3.  RECEIVER (on-site recipient of call): Mining engineer, Triage [place].   DATE & TIME NOTIFIED: 06/06/2019 at 1306.   MESSENGER (representative from lab): Kushani MT [employer].  MD NOTIFIED: Collaborative nurse.  TIME OF NOTIFICATION: 06/06/2019 at 1308.  RESPONSE: None.

## 2019-06-06 NOTE — Progress Notes (Signed)
Called blood bank to process her type and screen drawn today for crossmatched platelets for Monday.

## 2019-06-06 NOTE — Telephone Encounter (Signed)
CRITICAL VALUE STICKER  CRITICAL VALUE: ANC 0.3  RECEIVER (on-site recipient of call):Kaitlynne Wenz,RN  DATE & TIME NOTIFIED: 06/06/19 @ 1315  MESSENGER (representative from lab):Rosalind,RN (triage)  MD NOTIFIED: Dr. Benay Spice  TIME OF NOTIFICATION: [id number]  RESPONSE: F/U on 11/16 as scheduled.   Notified patient of results. Call for fever. Will plan to transfuse platelets on Monday. She reports Dr. Prince Solian has called to have her come there to discuss transplant option for future. Patient denies any bleeding.

## 2019-06-06 NOTE — Patient Instructions (Signed)

## 2019-06-09 ENCOUNTER — Other Ambulatory Visit: Payer: Self-pay

## 2019-06-09 ENCOUNTER — Inpatient Hospital Stay (HOSPITAL_BASED_OUTPATIENT_CLINIC_OR_DEPARTMENT_OTHER): Payer: BC Managed Care – PPO | Admitting: Oncology

## 2019-06-09 ENCOUNTER — Other Ambulatory Visit: Payer: Self-pay | Admitting: *Deleted

## 2019-06-09 ENCOUNTER — Other Ambulatory Visit: Payer: Self-pay | Admitting: Psychiatry

## 2019-06-09 ENCOUNTER — Inpatient Hospital Stay: Payer: BC Managed Care – PPO

## 2019-06-09 ENCOUNTER — Other Ambulatory Visit: Payer: Self-pay | Admitting: Nurse Practitioner

## 2019-06-09 VITALS — BP 108/71 | HR 100 | Temp 98.5°F | Resp 17 | Ht 66.0 in | Wt 138.5 lb

## 2019-06-09 DIAGNOSIS — C9110 Chronic lymphocytic leukemia of B-cell type not having achieved remission: Secondary | ICD-10-CM | POA: Diagnosis not present

## 2019-06-09 DIAGNOSIS — D649 Anemia, unspecified: Secondary | ICD-10-CM | POA: Diagnosis not present

## 2019-06-09 DIAGNOSIS — D696 Thrombocytopenia, unspecified: Secondary | ICD-10-CM

## 2019-06-09 DIAGNOSIS — D801 Nonfamilial hypogammaglobulinemia: Secondary | ICD-10-CM | POA: Diagnosis not present

## 2019-06-09 DIAGNOSIS — Z79899 Other long term (current) drug therapy: Secondary | ICD-10-CM | POA: Diagnosis not present

## 2019-06-09 DIAGNOSIS — E039 Hypothyroidism, unspecified: Secondary | ICD-10-CM | POA: Diagnosis not present

## 2019-06-09 DIAGNOSIS — Z95828 Presence of other vascular implants and grafts: Secondary | ICD-10-CM

## 2019-06-09 LAB — CBC WITH DIFFERENTIAL (CANCER CENTER ONLY)
Abs Immature Granulocytes: 0.01 10*3/uL (ref 0.00–0.07)
Basophils Absolute: 0 10*3/uL (ref 0.0–0.1)
Basophils Relative: 0 %
Eosinophils Absolute: 0.1 10*3/uL (ref 0.0–0.5)
Eosinophils Relative: 4 %
HCT: 20.8 % — ABNORMAL LOW (ref 36.0–46.0)
Hemoglobin: 7.2 g/dL — ABNORMAL LOW (ref 12.0–15.0)
Immature Granulocytes: 0 %
Lymphocytes Relative: 76 %
Lymphs Abs: 1.9 10*3/uL (ref 0.7–4.0)
MCH: 29.3 pg (ref 26.0–34.0)
MCHC: 34.6 g/dL (ref 30.0–36.0)
MCV: 84.6 fL (ref 80.0–100.0)
Monocytes Absolute: 0.2 10*3/uL (ref 0.1–1.0)
Monocytes Relative: 9 %
Neutro Abs: 0.3 10*3/uL — CL (ref 1.7–7.7)
Neutrophils Relative %: 11 %
Platelet Count: 12 10*3/uL — ABNORMAL LOW (ref 150–400)
RBC: 2.46 MIL/uL — ABNORMAL LOW (ref 3.87–5.11)
RDW: 12.8 % (ref 11.5–15.5)
WBC Count: 2.5 10*3/uL — ABNORMAL LOW (ref 4.0–10.5)
nRBC: 0 % (ref 0.0–0.2)

## 2019-06-09 LAB — SAMPLE TO BLOOD BANK

## 2019-06-09 LAB — CYCLOSPORINE: Cyclosporine, LabCorp: 166 ng/mL (ref 100–400)

## 2019-06-09 LAB — PREPARE RBC (CROSSMATCH)

## 2019-06-09 MED ORDER — SODIUM CHLORIDE 0.9% IV SOLUTION
250.0000 mL | Freq: Once | INTRAVENOUS | Status: AC
  Administered 2019-06-09: 250 mL via INTRAVENOUS
  Filled 2019-06-09: qty 250

## 2019-06-09 MED ORDER — HEPARIN SOD (PORK) LOCK FLUSH 100 UNIT/ML IV SOLN
250.0000 [IU] | INTRAVENOUS | Status: AC | PRN
  Administered 2019-06-09: 250 [IU]
  Filled 2019-06-09: qty 5

## 2019-06-09 MED ORDER — ONDANSETRON 8 MG PO TBDP
8.0000 mg | ORAL_TABLET | Freq: Once | ORAL | Status: AC
  Administered 2019-06-09: 8 mg via ORAL
  Filled 2019-06-09: qty 1

## 2019-06-09 MED ORDER — SODIUM CHLORIDE 0.9% FLUSH
10.0000 mL | INTRAVENOUS | Status: DC | PRN
  Administered 2019-06-09: 09:00:00 10 mL
  Filled 2019-06-09: qty 10

## 2019-06-09 MED ORDER — SODIUM CHLORIDE 0.9% FLUSH
10.0000 mL | INTRAVENOUS | Status: AC | PRN
  Administered 2019-06-09: 10 mL
  Filled 2019-06-09: qty 10

## 2019-06-09 NOTE — Progress Notes (Signed)
Per managed care, the posaconazole has been authorized. [facility] and [facility] was run [facility] with $10 copay for 30 day supply. The will fill now. Notified patient.

## 2019-06-09 NOTE — Patient Instructions (Signed)
PICC Home Care Guide ° °A peripherally inserted central catheter (PICC) is a form of IV access that allows medicines and IV fluids to be quickly distributed throughout the body. The PICC is a long, thin, flexible tube (catheter) that is inserted into a vein in the upper arm. The catheter ends in a large vein in the chest (superior vena cava, or SVC). After the PICC is inserted, a chest X-ray may be done to make sure that it is in the correct place. °A PICC may be placed for different reasons, such as: °· To give medicines and liquid nutrition. °· To give IV fluids and blood products. °· If there is trouble placing a peripheral intravenous (PIV) catheter. °If taken care of properly, a PICC can remain in place for several months. Having a PICC can also allow a person to go home from the hospital sooner. Medicine and PICC care can be managed at home by a family member, caregiver, or home health care team. °What are the risks? °Generally, having a PICC is safe. However, problems may occur, including: °· A blood clot (thrombus) forming in or at the tip of the PICC. °· A blood clot forming in a vein (deep vein thrombosis) or traveling to the lung (pulmonary embolism). °· Inflammation of the vein (phlebitis) in which the PICC is placed. °· Infection. Central line associated blood stream infection (CLABSI) is a serious infection that often requires hospitalization. °· PICC movement (malposition). The PICC tip may move from its original position due to excessive physical activity, forceful coughing, sneezing, or vomiting. °· A break or cut in the PICC. It is important not to use scissors near the PICC. °· Nerve or tendon irritation or injury during PICC insertion. °How to take care of your PICC °Preventing problems °· You and any caregivers should wash your hands often with soap. Wash hands: °? Before touching the PICC line or the infusion device. °? Before changing a bandage (dressing). °· Flush the PICC as told by your  health care provider. Let your health care provider know right away if the PICC is hard to flush or does not flush. Do not use force to flush the PICC. °· Do not use a syringe that is less than 10 mL to flush the PICC. °· Avoid blood pressure checks on the arm in which the PICC is placed. °· Never pull or tug on the PICC. °· Do not take the PICC out yourself. Only a trained clinical professional should remove the PICC. °· Use clean and sterile supplies only. Keep the supplies in a dry place. Do not reuse needles, syringes, or any other supplies. Doing that can lead to infection. °· Keep pets and children away from your PICC line. °· Check the PICC insertion site every day for signs of infection. Check for: °? Leakage. °? Redness, swelling, or pain. °? Fluid or blood. °? Warmth. °? Pus or a bad smell. °PICC dressing care °· Keep your PICC bandage (dressing) clean and dry to prevent infection. °· Do not take baths, swim, or use a hot tub until your health care provider approves. Ask your health care provider if you can take showers. You may only be allowed to take sponge baths for bathing. When you are allowed to shower: °? Ask your health care provider to teach you how to wrap the PICC line. °? Cover the PICC line with clear plastic wrap and tape to keep it dry while showering. °· Follow instructions from your health care provider   about how to take care of your insertion site and dressing. Make sure you: °? Wash your hands with soap and water before you change your bandage (dressing). If soap and water are not available, use hand sanitizer. °? Change your dressing as told by your health care provider. °? Leave stitches (sutures), skin glue, or adhesive strips in place. These skin closures may need to stay in place for 2 weeks or longer. If adhesive strip edges start to loosen and curl up, you may trim the loose edges. Do not remove adhesive strips completely unless your health care provider tells you to do  that. °· Change your PICC dressing if it becomes loose or wet. °General instructions ° °· Carry your PICC identification card or wear a medical alert bracelet at all times. °· Keep the tube clamped at all times, unless it is being used. °· Carry a smooth-edge clamp with you at all times to place on the tube if it breaks. °· Do not use scissors or sharp objects near the tube. °· You may bend your arm and move it freely. If your PICC is near or at the bend of your elbow, avoid activity with repeated motion at the elbow. °· Avoid lifting heavy objects as told by your health care provider. °· Keep all follow-up visits as told by your health care provider. This is important. °Disposal of supplies °· Throw away any syringes in a disposal container that is meant for sharp items (sharps container). You can buy a sharps container from a pharmacy, or you can make one by using an empty hard plastic bottle with a cover. °· Place any used dressings or infusion bags into a plastic bag. Throw that bag in the trash. °Contact a health care provider if: °· You have pain in your arm, ear, face, or teeth. °· You have a fever or chills. °· You have redness, swelling, or pain around the insertion site. °· You have fluid or blood coming from the insertion site. °· Your insertion site feels warm to the touch. °· You have pus or a bad smell coming from the insertion site. °· Your skin feels hard and raised around the insertion site. °Get help right away if: °· Your PICC is accidentally pulled all the way out. If this happens, cover the insertion site with a bandage or gauze dressing. Do not throw the PICC away. Your health care provider will need to check it. °· Your PICC was tugged or pulled and has partially come out. Do not  push the PICC back in. °· You cannot flush the PICC, it is hard to flush, or the PICC leaks around the insertion site when it is flushed. °· You hear a "flushing" sound when the PICC is flushed. °· You feel your  heart racing or skipping beats. °· There is a hole or tear in the PICC. °· You have swelling in the arm in which the PICC was inserted. °· You have a red streak going up your arm from where the PICC was inserted. °Summary °· A peripherally inserted central catheter (PICC) is a long, thin, flexible tube (catheter) that is inserted into a vein in the upper arm. °· The PICC is inserted using a sterile technique by a specially trained nurse or physician. Only a trained clinical professional should remove it. °· Keep your PICC identification card with you at all times. °· Avoid blood pressure checks on the arm in which the PICC is placed. °· If cared for   properly, a PICC can remain in place for several months. Having a PICC can also allow a person to go home from the hospital sooner. °This information is not intended to replace advice given to you by your health care provider. Make sure you discuss any questions you have with your health care provider. °Document Released: 01/14/2003 Document Revised: 06/22/2017 Document Reviewed: 08/12/2016 °Elsevier Patient Education © 2020 Elsevier Inc. ° °

## 2019-06-09 NOTE — Patient Instructions (Addendum)
Blood Transfusion, Adult, Care After This sheet gives you information about how to care for yourself after your procedure. Your doctor may also give you more specific instructions. If you have problems or questions, contact your doctor. Follow these instructions at home:   Take over-the-counter and prescription medicines only as told by your doctor.  Go back to your normal activities as told by your doctor.  Follow instructions from your doctor about how to take care of the area where an IV tube was put into your vein (insertion site). Make sure you: ? Wash your hands with soap and water before you change your bandage (dressing). If there is no soap and water, use hand sanitizer. ? Change your bandage as told by your doctor.  Check your IV insertion site every day for signs of infection. Check for: ? More redness, swelling, or pain. ? More fluid or blood. ? Warmth. ? Pus or a bad smell. Contact a doctor if:  You have more redness, swelling, or pain around the IV insertion site.  You have more fluid or blood coming from the IV insertion site.  Your IV insertion site feels warm to the touch.  You have pus or a bad smell coming from the IV insertion site.  Your pee (urine) turns pink, red, or brown.  You feel weak after doing your normal activities. Get help right away if:  You have signs of a serious allergic or body defense (immune) system reaction, including: ? Itchiness. ? Hives. ? Trouble breathing. ? Anxiety. ? Pain in your chest or lower back. ? Fever, flushing, and chills. ? Fast pulse. ? Rash. ? Watery poop (diarrhea). ? Throwing up (vomiting). ? Dark pee. ? Serious headache. ? Dizziness. ? Stiff neck. ? Yellow color in your face or the white parts of your eyes (jaundice). Summary  After a blood transfusion, return to your normal activities as told by your doctor.  Every day, check for signs of infection where the IV tube was put into your vein.  Some  signs of infection are warm skin, more redness and pain, more fluid or blood, and pus or a bad smell where the needle went in.  Contact your doctor if you feel weak or have any unusual symptoms. This information is not intended to replace advice given to you by your health care provider. Make sure you discuss any questions you have with your health care provider. Document Released: 07/31/2014 Document Revised: 11/14/2017 Document Reviewed: 03/03/2016  Platelet Transfusion A platelet transfusion is a procedure in which you receive donated platelets through an IV. Platelets are tiny pieces of blood cells. When you get an injury, platelets clump together in the area to form a blood clot. This helps stop bleeding and is the beginning of the healing process. If you have too few platelets, your blood may have trouble clotting. This may cause you to bleed and bruise very easily. You may need a platelet transfusion if you have a condition that causes a low number of platelets (thrombocytopenia). A platelet transfusion may be used to stop or prevent excessive bleeding. Tell a health care provider about:  Any reactions you have had during previous transfusions.  Any allergies you have.  All medicines you are taking, including vitamins, herbs, eye drops, creams, and over-the-counter medicines.  Any blood disorders you have.  Any surgeries you have had.  Any medical conditions you have.  Whether you are pregnant or may be pregnant. What are the risks? Generally, this is  a safe procedure. However, problems may occur, including:  Fever.  Infection.  Allergic reaction to the donor platelets.  Your body's disease-fighting system (immune system) attacking the donor platelets (hemolytic reaction). This is rare.  A rare reaction that causes lung damage (transfusion-related acute lung injury). What happens before the procedure? Medicines  Ask your health care provider about: ? Changing or  stopping your regular medicines. This is especially important if you are taking diabetes medicines or blood thinners. ? Taking medicines such as aspirin and ibuprofen. These medicines can thin your blood. Do not take these medicines unless your health care provider tells you to take them. ? Taking over-the-counter medicines, vitamins, herbs, and supplements. General instructions  You will have a blood test to determine your blood type. Your blood type determines what kind of platelets you will be given.  Follow instructions from your health care provider about eating or drinking restrictions.  If you have had an allergic reaction to a transfusion in the past, you may be given medicine to help prevent a reaction.  Your temperature, blood pressure, pulse, and breathing will be monitored. What happens during the procedure?   An IV will be inserted into one of your veins.  For your safety, two health care providers will verify your identity along with the donor platelets about to be infused.  A bag of donor platelets will be connected to your IV. The platelets will flow into your bloodstream. This usually takes 30-60 minutes.  Your temperature, blood pressure, pulse, and breathing will be monitored during the transfusion. This helps detect early signs of any reaction.  You will also be monitored for other symptoms that may indicate a reaction, including chills, hives, or itching.  If you have signs of a reaction at any time, your transfusion will be stopped, and you may be given medicine to help manage the reaction.  When your transfusion is complete, your IV will be removed.  Pressure may be applied to the IV site for a few minutes to stop any bleeding.  The IV site will be covered with a bandage (dressing). The procedure may vary among health care providers and hospitals. What happens after the procedure?  Your blood pressure, temperature, pulse, and breathing will be monitored until  you leave the hospital or clinic.  You may have some bruising and soreness at your IV site. Follow these instructions at home: Medicines  Take over-the-counter and prescription medicines only as told by your health care provider.  Talk with your health care provider before you take any medicines that contain aspirin or NSAIDs. These medicines increase your risk for dangerous bleeding. General instructions  Change or remove your dressing as told by your health care provider.  Return to your normal activities as told by your health care provider. Ask your health care provider what activities are safe for you.  Do not take baths, swim, or use a hot tub until your health care provider approves. Ask your health care provider if you may take showers.  Check your IV site every day for signs of infection. Check for: ? Redness, swelling, or pain. ? Fluid or blood. If fluid or blood drains from your IV site, use your hands to press down firmly on a bandage covering the area for a minute or two. Doing this should stop the bleeding. ? Warmth. ? Pus or a bad smell.  Keep all follow-up visits as told by your health care provider. This is important. Contact a health care  provider if you have:  A headache that does not go away with medicine.  Hives, rash, or itchy skin.  Nausea or vomiting.  Unusual tiredness or weakness.  Signs of infection at your IV site. Get help right away if:  You have a fever or chills.  You urinate less often than usual.  Your urine is darker colored than normal.  You have any of the following: ? Trouble breathing. ? Pain in your back, abdomen, or chest. ? Cool, clammy skin. ? A fast heartbeat. Summary  Platelets are tiny pieces of blood cells that clump together to form a blood clot when you have an injury. If you have too few platelets, your blood may have trouble clotting.  A platelet transfusion is a procedure in which you receive donated platelets  through an IV.  A platelet transfusion may be used to stop or prevent excessive bleeding.  After the procedure, check your IV site every day for signs of infection, including redness, swelling, pain, or warmth. This information is not intended to replace advice given to you by your health care provider. Make sure you discuss any questions you have with your health care provider. Document Released: 05/07/2007 Document Revised: 08/15/2017 Document Reviewed: 08/15/2017 Elsevier Patient Education  2020 [employer] Patient Education  [employer].

## 2019-06-09 NOTE — Progress Notes (Signed)
Called blood bank to notify will need 1 unit blood today. Orders are in and noted by blood bank. Platelets are ready now.

## 2019-06-09 NOTE — Progress Notes (Addendum)
Received call from Joellen Jersey, PharmD at [facility]: when patient starts posaconazole today, decrease the cyclosporin to 200 mg bid. Results of 11/13 cyclosporin level faxed to [facility] this morning. Patient notified of new dosing directions and was given information in writing as well.

## 2019-06-09 NOTE — Telephone Encounter (Signed)
Okay to send refill? I know she has so much going on now. Not sure last visit

## 2019-06-09 NOTE — Progress Notes (Signed)
[place] OFFICE PROGRESS NOTE   Diagnosis: CLL, aplastic anemia  INTERVAL HISTORY:   Meagan Walsh returns as scheduled.  She continues to have intermittent nausea, but this has improved over the past week.  No bleeding blood on her pillow during the night.  No fever.  The cyclosporine dose was increased to 250 mg twice daily beginning 06/05/2019.  She feels the need of a red cell transfusion today.  She has noted some hypertrophy.  Objective:  Vital signs in last 24 hours:  Blood pressure 108/71, pulse 100, temperature 98.5 F (36.9 C), temperature source Temporal, resp. rate 17, height '5\' 6"'$  (1.676 m), weight 138 lb 8 oz (62.8 kg), SpO2 100 %.    HEENT: Periodontal disease, gingival hypertrophy, no thrush or bleeding Resp: Lungs clear bilaterally Cardio: Regular rate and rhythm GI: No hepatosplenomegaly, soft, nontender Vascular: No leg edema  Skin: Few petechiae over the upper abdominal wall  Portacath/PICC-without erythema  Lab Results:  Lab Results  Component Value Date   WBC 2.5 (L) 06/09/2019   HGB 7.2 (L) 06/09/2019   HCT 20.8 (L) 06/09/2019   MCV 84.6 06/09/2019   PLT 12 (L) 06/09/2019   NEUTROABS 0.3 (LL) 06/09/2019    CMP  Lab Results  Component Value Date   NA 139 06/06/2019   K 4.5 06/06/2019   CL 105 06/06/2019   CO2 26 06/06/2019   GLUCOSE 106 (H) 06/06/2019   BUN 18 06/06/2019   CREATININE 0.81 06/06/2019   CALCIUM 9.7 06/06/2019   PROT 5.9 (L) 06/06/2019   ALBUMIN 3.9 06/06/2019   AST 23 06/06/2019   ALT 46 (H) 06/06/2019   ALKPHOS 172 (H) 06/06/2019   BILITOT 0.7 06/06/2019   GFRNONAA >60 06/06/2019   GFRAA >60 06/06/2019     Medications: I have reviewed the patient's current medications.   Assessment/Plan: 1. CLL-diagnosed in August 2010, flow cytometry consistent with CLL Enlarged leftinguinal lymph node January 2019,smallneck/axillary nodes and palpable splenomegaly 09/11/2017  [facility] 09/17/2017-3 cm necrotic  appearing lymph node in the left inguinal region, borderline enlarged pelvic/retroperitoneal, chest, and axillary nodes. Mild splenomegaly.  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, no evidence of 11q22deletion  Bone marrow biopsy 02/26/2018-hypercellular marrow with extensive involvement by CLL, lymphocytes represent85% of all cells  Ibrutinib initiated 04/03/2018  Ibrutinib placed on hold 04/11/2018 due to onset of arthralgias  Ibrutinib resumed 04/16/2018, discontinued 04/25/2018 secondary to severe arthralgias/arthritis  Ibrutinib resumed at a dose of 140 mg daily 05/03/2018  Ibrutinib dose adjusted to 140 mg alternating with '280mg'$  06/25/2018  Ibrutinib discontinued 07/03/2018 secondary to severe arthralgias  Acalabrutinib 08/16/2018, discontinued 11/15/2018 secondary to persistent severe transfusion dependent anemia and neutropenia/thrombocytopenia, last dose 11/14/2018  Bone marrow biopsy 11/21/2018-decreased cellularity, involvement by CLL, decreased erythroid and granulocytic precursors, decreased megakaryocytes  Cycle 1 rituximab 12/06/2018  Bone marrow biopsy 12/24/2018 at [facility]-hypocellular bone marrow (10%) involved by CLL, representing 50% of marrow cellularity; markedly decreased trilineage hematopoiesis including essentially absent erythropoiesis  Bone marrow biopsy 03/06/2027 [facility]-hypocellular bone marrow (20%) with marked reduction of  maturing hematopoietic elements; numerous lymphoid aggregates  consistent with involvement by CLL, representing approximately 50% of marrow cellularity by flow cytometry  ATG/cyclosporine at [facility] beginning 03/19/2019 followed by prednisone taper  2.Hypothyroidism 3.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 Augmentin prescribed. 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 Pancytopenia secondary to CLL and a hypocellular bone marrow  G-CSF and Nplate started 4/62/1948, G-CSF changed to daily beginning 12/17/2018; G-CSF discontinued 12/31/2018, last Nplate 01/15/2019  Began prednisone 60 mg daily 01/11/19, tolerating moderately well except jitteriness, irritability, and difficulty sleeping. Tapered to 40 mg daily x1 week starting 01/24/19, reduce by 10 mg each week until discontinued. Prednisone discontinued 02/20/2019.  Promacta started 01/20/2019, dose increased to 100 mg daily 02/04/2019; dose increased to 150 mg daily 02/21/2019  IVIG daily for 2 days beginning 02/25/2019   9. Severe headache and nausea/vomiting 02/27/2019-likely related to IVIG therapy,resolved 10.  Intermittent nausea and vomiting following cyclosporine dosing 11.  Gingival hypertrophy    Disposition: Ms. Haddaway has persistent severe pancytopenia.  The neutrophil count is stable today.  The platelets are lower.  She will receive a platelet transfusion today.  She will also receive a red cell transfusion.  We will follow-up on the cyclosporine level from 06/06/2019, though the dose had only been increased on 06/05/2019.  posaconazole  has now been approved by her insurance company.  This will affect the cyclosporine dosing.  She is scheduled for a transplant evaluation at [facility] tomorrow.  She will return for lab visit here on 06/13/2019 and an office visit on 06/16/2019.  I continue to communicate with [employer] regarding the indication for adding Promacta, changing to tacrolimus, antibiotic prophylaxis, and evaluation for bone marrow transplant.    Betsy Coder, MD  06/09/2019  10:30 AM

## 2019-06-10 ENCOUNTER — Encounter: Admit: 2019-06-10 | Discharge: 2019-06-10 | Payer: PRIVATE HEALTH INSURANCE

## 2019-06-10 ENCOUNTER — Telehealth: Payer: Self-pay | Admitting: Oncology

## 2019-06-10 DIAGNOSIS — R7303 Prediabetes: Secondary | ICD-10-CM | POA: Diagnosis not present

## 2019-06-10 DIAGNOSIS — Z7682 Awaiting organ transplant status: Secondary | ICD-10-CM | POA: Diagnosis not present

## 2019-06-10 DIAGNOSIS — C9110 Chronic lymphocytic leukemia of B-cell type not having achieved remission: Secondary | ICD-10-CM | POA: Diagnosis not present

## 2019-06-10 DIAGNOSIS — F329 Major depressive disorder, single episode, unspecified: Secondary | ICD-10-CM | POA: Diagnosis not present

## 2019-06-10 DIAGNOSIS — R112 Nausea with vomiting, unspecified: Secondary | ICD-10-CM | POA: Diagnosis not present

## 2019-06-10 DIAGNOSIS — E039 Hypothyroidism, unspecified: Secondary | ICD-10-CM | POA: Diagnosis not present

## 2019-06-10 DIAGNOSIS — D801 Nonfamilial hypogammaglobulinemia: Secondary | ICD-10-CM | POA: Diagnosis not present

## 2019-06-10 DIAGNOSIS — Z803 Family history of malignant neoplasm of breast: Secondary | ICD-10-CM | POA: Diagnosis not present

## 2019-06-10 DIAGNOSIS — R590 Localized enlarged lymph nodes: Secondary | ICD-10-CM | POA: Diagnosis not present

## 2019-06-10 DIAGNOSIS — T50905A Adverse effect of unspecified drugs, medicaments and biological substances, initial encounter: Secondary | ICD-10-CM | POA: Diagnosis not present

## 2019-06-10 DIAGNOSIS — B1910 Unspecified viral hepatitis B without hepatic coma: Secondary | ICD-10-CM | POA: Diagnosis not present

## 2019-06-10 DIAGNOSIS — D619 Aplastic anemia, unspecified: Secondary | ICD-10-CM | POA: Diagnosis not present

## 2019-06-10 DIAGNOSIS — F419 Anxiety disorder, unspecified: Secondary | ICD-10-CM | POA: Diagnosis not present

## 2019-06-10 LAB — BPAM PLATELET PHERESIS
Blood Product Expiration Date: 202011162359
ISSUE DATE / TIME: 202011161052
Unit Type and Rh: 5100

## 2019-06-10 LAB — TYPE AND SCREEN
ABO/RH(D): O POS
Antibody Screen: NEGATIVE
Unit division: 0

## 2019-06-10 LAB — BPAM RBC
Blood Product Expiration Date: 202012112359
ISSUE DATE / TIME: 202011161053
Unit Type and Rh: 5100

## 2019-06-10 LAB — PREPARE PLATELET PHERESIS: Unit division: 0

## 2019-06-10 NOTE — Telephone Encounter (Signed)
I talk with patient regarding schedule  

## 2019-06-12 ENCOUNTER — Other Ambulatory Visit: Payer: Self-pay

## 2019-06-12 ENCOUNTER — Ambulatory Visit
Admission: RE | Admit: 2019-06-12 | Discharge: 2019-06-12 | Disposition: A | Discharge status: Home / Self Care | Payer: BC Managed Care – PPO | Source: Ambulatory Visit | Attending: Oncology | Admitting: Oncology

## 2019-06-12 DIAGNOSIS — C9110 Chronic lymphocytic leukemia of B-cell type not having achieved remission: Secondary | ICD-10-CM | POA: Diagnosis not present

## 2019-06-12 MED ORDER — GADOBENATE DIMEGLUMINE 529 MG/ML IV SOLN
12.0000 mL | Freq: Once | INTRAVENOUS | Status: AC | PRN
  Administered 2019-06-12: 12 mL via INTRAVENOUS

## 2019-06-13 ENCOUNTER — Inpatient Hospital Stay: Payer: BC Managed Care – PPO

## 2019-06-13 ENCOUNTER — Other Ambulatory Visit: Payer: Self-pay | Admitting: *Deleted

## 2019-06-13 ENCOUNTER — Telehealth: Payer: Self-pay | Admitting: *Deleted

## 2019-06-13 ENCOUNTER — Other Ambulatory Visit: Payer: Self-pay | Admitting: Nurse Practitioner

## 2019-06-13 ENCOUNTER — Other Ambulatory Visit: Payer: Self-pay

## 2019-06-13 ENCOUNTER — Encounter: Payer: Self-pay | Admitting: Pharmacist

## 2019-06-13 DIAGNOSIS — D649 Anemia, unspecified: Secondary | ICD-10-CM

## 2019-06-13 DIAGNOSIS — D696 Thrombocytopenia, unspecified: Secondary | ICD-10-CM

## 2019-06-13 DIAGNOSIS — E039 Hypothyroidism, unspecified: Secondary | ICD-10-CM | POA: Diagnosis not present

## 2019-06-13 DIAGNOSIS — D801 Nonfamilial hypogammaglobulinemia: Secondary | ICD-10-CM | POA: Diagnosis not present

## 2019-06-13 DIAGNOSIS — C9110 Chronic lymphocytic leukemia of B-cell type not having achieved remission: Secondary | ICD-10-CM | POA: Diagnosis not present

## 2019-06-13 DIAGNOSIS — Z79899 Other long term (current) drug therapy: Secondary | ICD-10-CM | POA: Diagnosis not present

## 2019-06-13 DIAGNOSIS — Z95828 Presence of other vascular implants and grafts: Secondary | ICD-10-CM

## 2019-06-13 LAB — CMP (CANCER CENTER ONLY)
ALT: 51 U/L — ABNORMAL HIGH (ref 0–44)
AST: 41 U/L (ref 15–41)
Albumin: 3.6 g/dL (ref 3.5–5.0)
Alkaline Phosphatase: 129 U/L — ABNORMAL HIGH (ref 38–126)
Anion gap: 7 (ref 5–15)
BUN: 19 mg/dL (ref 6–20)
CO2: 23 mmol/L (ref 22–32)
Calcium: 9.3 mg/dL (ref 8.9–10.3)
Chloride: 108 mmol/L (ref 98–111)
Creatinine: 1.07 mg/dL — ABNORMAL HIGH (ref 0.44–1.00)
GFR, Est AFR Am: >60 mL/min (ref >60)
GFR, Estimated: 57 mL/min — ABNORMAL LOW (ref >60)
Glucose, Bld: 188 mg/dL — ABNORMAL HIGH (ref 70–99)
Potassium: 3.7 mmol/L (ref 3.5–5.1)
Sodium: 138 mmol/L (ref 135–145)
Total Bilirubin: 0.7 mg/dL (ref 0.3–1.2)
Total Protein: 5.7 g/dL — ABNORMAL LOW (ref 6.5–8.1)

## 2019-06-13 LAB — CBC WITH DIFFERENTIAL (CANCER CENTER ONLY)
Abs Immature Granulocytes: 0.01 10*3/uL (ref 0.00–0.07)
Basophils Absolute: 0 10*3/uL (ref 0.0–0.1)
Basophils Relative: 0 %
Eosinophils Absolute: 0.1 10*3/uL (ref 0.0–0.5)
Eosinophils Relative: 3 %
HCT: 23.3 % — ABNORMAL LOW (ref 36.0–46.0)
Hemoglobin: 8 g/dL — ABNORMAL LOW (ref 12.0–15.0)
Immature Granulocytes: 1 %
Lymphocytes Relative: 78 %
Lymphs Abs: 1.5 10*3/uL (ref 0.7–4.0)
MCH: 29.9 pg (ref 26.0–34.0)
MCHC: 34.3 g/dL (ref 30.0–36.0)
MCV: 86.9 fL (ref 80.0–100.0)
Monocytes Absolute: 0.2 10*3/uL (ref 0.1–1.0)
Monocytes Relative: 10 %
Neutro Abs: 0.2 10*3/uL — CL (ref 1.7–7.7)
Neutrophils Relative %: 8 %
Platelet Count: 8 10*3/uL — CL (ref 150–400)
RBC: 2.68 MIL/uL — ABNORMAL LOW (ref 3.87–5.11)
RDW: 12.8 % (ref 11.5–15.5)
WBC Count: 1.8 10*3/uL — ABNORMAL LOW (ref 4.0–10.5)
nRBC: 0 % (ref 0.0–0.2)

## 2019-06-13 LAB — PLATELET COUNT (CANCER CENTER ONLY): Platelet Count: 31 10*3/uL — ABNORMAL LOW (ref 150–400)

## 2019-06-13 LAB — SAMPLE TO BLOOD BANK

## 2019-06-13 LAB — PREPARE RBC (CROSSMATCH)

## 2019-06-13 MED ORDER — HEPARIN SOD (PORK) LOCK FLUSH 100 UNIT/ML IV SOLN
250.0000 [IU] | INTRAVENOUS | Status: AC | PRN
  Administered 2019-06-13: 250 [IU]
  Filled 2019-06-13: qty 5

## 2019-06-13 MED ORDER — SODIUM CHLORIDE 0.9% FLUSH
10.0000 mL | INTRAVENOUS | Status: AC | PRN
  Administered 2019-06-13: 10 mL
  Filled 2019-06-13: qty 10

## 2019-06-13 MED ORDER — SODIUM CHLORIDE 0.9% FLUSH
10.0000 mL | INTRAVENOUS | Status: DC | PRN
  Filled 2019-06-13: qty 10

## 2019-06-13 MED ORDER — SODIUM CHLORIDE 0.9% IV SOLUTION
250.0000 mL | Freq: Once | INTRAVENOUS | Status: AC
  Administered 2019-06-13: 250 mL via INTRAVENOUS
  Filled 2019-06-13: qty 250

## 2019-06-13 NOTE — Telephone Encounter (Signed)
Notified of message below

## 2019-06-13 NOTE — Telephone Encounter (Signed)
-----   Message from Ladell Pier, MD sent at 06/12/2019  5:30 PM EST ----- Please call patient, MRI consistent with increased iron in liver, will need to start exjade, will discuss with Dr. Prince Solian tomorrow, lets see her counts tomorrow and I will then contact [facility]

## 2019-06-13 NOTE — Progress Notes (Signed)
Per Dr. Benay Spice: wait 30 min after platelet transfusion is completed, draw platelet count, and then hang unit of PRBC; that way if patient needs another unit of platelets she can receive after unit of PRBC.

## 2019-06-13 NOTE — Patient Instructions (Signed)
Platelet Transfusion A platelet transfusion is a procedure in which you receive donated platelets through an IV. Platelets are tiny pieces of blood cells. When you get an injury, platelets clump together in the area to form a blood clot. This helps stop bleeding and is the beginning of the healing process. If you have too few platelets, your blood may have trouble clotting. This may cause you to bleed and bruise very easily. You may need a platelet transfusion if you have a condition that causes a low number of platelets (thrombocytopenia). A platelet transfusion may be used to stop or prevent excessive bleeding. Tell a health care provider about:  Any reactions you have had during previous transfusions.  Any allergies you have.  All medicines you are taking, including vitamins, herbs, eye drops, creams, and over-the-counter medicines.  Any blood disorders you have.  Any surgeries you have had.  Any medical conditions you have.  Whether you are pregnant or may be pregnant. What are the risks? Generally, this is a safe procedure. However, problems may occur, including:  Fever.  Infection.  Allergic reaction to the donor platelets.  Your body's disease-fighting system (immune system) attacking the donor platelets (hemolytic reaction). This is rare.  A rare reaction that causes lung damage (transfusion-related acute lung injury). What happens before the procedure? Medicines  Ask your health care provider about: ? Changing or stopping your regular medicines. This is especially important if you are taking diabetes medicines or blood thinners. ? Taking medicines such as aspirin and ibuprofen. These medicines can thin your blood. Do not take these medicines unless your health care provider tells you to take them. ? Taking over-the-counter medicines, vitamins, herbs, and supplements. General instructions  You will have a blood test to determine your blood type. Your blood type  determines what kind of platelets you will be given.  Follow instructions from your health care provider about eating or drinking restrictions.  If you have had an allergic reaction to a transfusion in the past, you may be given medicine to help prevent a reaction.  Your temperature, blood pressure, pulse, and breathing will be monitored. What happens during the procedure?   An IV will be inserted into one of your veins.  For your safety, two health care providers will verify your identity along with the donor platelets about to be infused.  A bag of donor platelets will be connected to your IV. The platelets will flow into your bloodstream. This usually takes 30-60 minutes.  Your temperature, blood pressure, pulse, and breathing will be monitored during the transfusion. This helps detect early signs of any reaction.  You will also be monitored for other symptoms that may indicate a reaction, including chills, hives, or itching.  If you have signs of a reaction at any time, your transfusion will be stopped, and you may be given medicine to help manage the reaction.  When your transfusion is complete, your IV will be removed.  Pressure may be applied to the IV site for a few minutes to stop any bleeding.  The IV site will be covered with a bandage (dressing). The procedure may vary among health care providers and hospitals. What happens after the procedure?  Your blood pressure, temperature, pulse, and breathing will be monitored until you leave the hospital or clinic.  You may have some bruising and soreness at your IV site. Follow these instructions at home: Medicines  Take over-the-counter and prescription medicines only as told by your health care provider.  Talk with your health care provider before you take any medicines that contain aspirin or NSAIDs. These medicines increase your risk for dangerous bleeding. General instructions  Change or remove your dressing as told  by your health care provider.  Return to your normal activities as told by your health care provider. Ask your health care provider what activities are safe for you.  Do not take baths, swim, or use a hot tub until your health care provider approves. Ask your health care provider if you may take showers.  Check your IV site every day for signs of infection. Check for: ? Redness, swelling, or pain. ? Fluid or blood. If fluid or blood drains from your IV site, use your hands to press down firmly on a bandage covering the area for a minute or two. Doing this should stop the bleeding. ? Warmth. ? Pus or a bad smell.  Keep all follow-up visits as told by your health care provider. This is important. Contact a health care provider if you have:  A headache that does not go away with medicine.  Hives, rash, or itchy skin.  Nausea or vomiting.  Unusual tiredness or weakness.  Signs of infection at your IV site. Get help right away if:  You have a fever or chills.  You urinate less often than usual.  Your urine is darker colored than normal.  You have any of the following: ? Trouble breathing. ? Pain in your back, abdomen, or chest. ? Cool, clammy skin. ? A fast heartbeat. Summary  Platelets are tiny pieces of blood cells that clump together to form a blood clot when you have an injury. If you have too few platelets, your blood may have trouble clotting.  A platelet transfusion is a procedure in which you receive donated platelets through an IV.  A platelet transfusion may be used to stop or prevent excessive bleeding.  After the procedure, check your IV site every day for signs of infection, including redness, swelling, pain, or warmth. This information is not intended to replace advice given to you by your health care provider. Make sure you discuss any questions you have with your health care provider. Document Released: 05/07/2007 Document Revised: 08/15/2017 Document  Reviewed: 08/15/2017 Elsevier Patient Education  2020 [employer]    Blood Transfusion, Adult, Care After This sheet gives you information about how to care for yourself after your procedure. Your doctor may also give you more specific instructions. If you have problems or questions, contact your doctor. Follow these instructions at home:   Take over-the-counter and prescription medicines only as told by your doctor.  Go back to your normal activities as told by your doctor.  Follow instructions from your doctor about how to take care of the area where an IV tube was put into your vein (insertion site). Make sure you: ? Wash your hands with soap and water before you change your bandage (dressing). If there is no soap and water, use hand sanitizer. ? Change your bandage as told by your doctor.  Check your IV insertion site every day for signs of infection. Check for: ? More redness, swelling, or pain. ? More fluid or blood. ? Warmth. ? Pus or a bad smell. Contact a doctor if:  You have more redness, swelling, or pain around the IV insertion site.  You have more fluid or blood coming from the IV insertion site.  Your IV insertion site feels warm to the touch.  You have pus   or a bad smell coming from the IV insertion site.  Your pee (urine) turns pink, red, or brown.  You feel weak after doing your normal activities. Get help right away if:  You have signs of a serious allergic or body defense (immune) system reaction, including: ? Itchiness. ? Hives. ? Trouble breathing. ? Anxiety. ? Pain in your chest or lower back. ? Fever, flushing, and chills. ? Fast pulse. ? Rash. ? Watery poop (diarrhea). ? Throwing up (vomiting). ? Dark pee. ? Serious headache. ? Dizziness. ? Stiff neck. ? Yellow color in your face or the white parts of your eyes (jaundice). Summary  After a blood transfusion, return to your normal activities as told by your doctor.  Every day, check  for signs of infection where the IV tube was put into your vein.  Some signs of infection are warm skin, more redness and pain, more fluid or blood, and pus or a bad smell where the needle went in.  Contact your doctor if you feel weak or have any unusual symptoms. This information is not intended to replace advice given to you by your health care provider. Make sure you discuss any questions you have with your health care provider. Document Released: 07/31/2014 Document Revised: 11/14/2017 Document Reviewed: 03/03/2016 Elsevier Patient Education  2020 Elsevier Inc.  

## 2019-06-13 NOTE — Progress Notes (Signed)
Recheck of platelets 31. Dr. Benay Spice made aware, no new orders at this time.

## 2019-06-14 LAB — PREPARE PLATELET PHERESIS: Unit division: 0

## 2019-06-14 LAB — BPAM PLATELET PHERESIS
Blood Product Expiration Date: 202011212359
ISSUE DATE / TIME: 202011201109
Unit Type and Rh: 9500

## 2019-06-14 LAB — TYPE AND SCREEN
ABO/RH(D): O POS
Antibody Screen: NEGATIVE
Unit division: 0

## 2019-06-14 LAB — BPAM RBC
Blood Product Expiration Date: 202012182359
ISSUE DATE / TIME: 202011201334
Unit Type and Rh: 5100

## 2019-06-16 ENCOUNTER — Inpatient Hospital Stay: Payer: BC Managed Care – PPO

## 2019-06-16 ENCOUNTER — Encounter: Payer: Self-pay | Admitting: Nurse Practitioner

## 2019-06-16 ENCOUNTER — Other Ambulatory Visit: Payer: Self-pay | Admitting: *Deleted

## 2019-06-16 ENCOUNTER — Telehealth: Payer: Self-pay | Admitting: Nurse Practitioner

## 2019-06-16 ENCOUNTER — Encounter: Payer: Self-pay | Admitting: *Deleted

## 2019-06-16 ENCOUNTER — Inpatient Hospital Stay (HOSPITAL_BASED_OUTPATIENT_CLINIC_OR_DEPARTMENT_OTHER): Payer: BC Managed Care – PPO | Admitting: Nurse Practitioner

## 2019-06-16 ENCOUNTER — Other Ambulatory Visit: Payer: Self-pay

## 2019-06-16 ENCOUNTER — Other Ambulatory Visit: Payer: Self-pay | Admitting: Pharmacist

## 2019-06-16 ENCOUNTER — Telehealth: Payer: Self-pay | Admitting: *Deleted

## 2019-06-16 VITALS — BP 117/69 | HR 92 | Temp 98.7°F | Resp 18 | Ht 66.0 in | Wt 138.9 lb

## 2019-06-16 DIAGNOSIS — C9110 Chronic lymphocytic leukemia of B-cell type not having achieved remission: Secondary | ICD-10-CM | POA: Diagnosis not present

## 2019-06-16 DIAGNOSIS — D696 Thrombocytopenia, unspecified: Secondary | ICD-10-CM

## 2019-06-16 DIAGNOSIS — D801 Nonfamilial hypogammaglobulinemia: Secondary | ICD-10-CM | POA: Diagnosis not present

## 2019-06-16 DIAGNOSIS — Z95828 Presence of other vascular implants and grafts: Secondary | ICD-10-CM

## 2019-06-16 DIAGNOSIS — Z79899 Other long term (current) drug therapy: Secondary | ICD-10-CM | POA: Diagnosis not present

## 2019-06-16 DIAGNOSIS — D649 Anemia, unspecified: Secondary | ICD-10-CM

## 2019-06-16 DIAGNOSIS — E039 Hypothyroidism, unspecified: Secondary | ICD-10-CM | POA: Diagnosis not present

## 2019-06-16 LAB — CBC WITH DIFFERENTIAL (CANCER CENTER ONLY)
Abs Immature Granulocytes: 0.03 10*3/uL (ref 0.00–0.07)
Basophils Absolute: 0 10*3/uL (ref 0.0–0.1)
Basophils Relative: 0 %
Eosinophils Absolute: 0 10*3/uL (ref 0.0–0.5)
Eosinophils Relative: 2 %
HCT: 25.5 % — ABNORMAL LOW (ref 36.0–46.0)
Hemoglobin: 8.9 g/dL — ABNORMAL LOW (ref 12.0–15.0)
Immature Granulocytes: 2 %
Lymphocytes Relative: 73 %
Lymphs Abs: 1 10*3/uL (ref 0.7–4.0)
MCH: 29.2 pg (ref 26.0–34.0)
MCHC: 34.9 g/dL (ref 30.0–36.0)
MCV: 83.6 fL (ref 80.0–100.0)
Monocytes Absolute: 0.2 10*3/uL (ref 0.1–1.0)
Monocytes Relative: 15 %
Neutro Abs: 0.1 10*3/uL — CL (ref 1.7–7.7)
Neutrophils Relative %: 8 %
Platelet Count: 15 10*3/uL — ABNORMAL LOW (ref 150–400)
RBC: 3.05 MIL/uL — ABNORMAL LOW (ref 3.87–5.11)
RDW: 13.4 % (ref 11.5–15.5)
WBC Count: 1.4 10*3/uL — ABNORMAL LOW (ref 4.0–10.5)
nRBC: 0 % (ref 0.0–0.2)

## 2019-06-16 LAB — SAMPLE TO BLOOD BANK

## 2019-06-16 LAB — CYCLOSPORINE: Cyclosporine, LabCorp: 396 ng/mL (ref 100–400)

## 2019-06-16 MED ORDER — HEPARIN SOD (PORK) LOCK FLUSH 100 UNIT/ML IV SOLN
500.0000 [IU] | Freq: Once | INTRAVENOUS | Status: AC | PRN
  Administered 2019-06-16: 500 [IU]
  Filled 2019-06-16: qty 5

## 2019-06-16 MED ORDER — SODIUM CHLORIDE 0.9% FLUSH
10.0000 mL | INTRAVENOUS | Status: DC | PRN
  Administered 2019-06-16: 10 mL
  Filled 2019-06-16: qty 10

## 2019-06-16 MED ORDER — ONDANSETRON 8 MG PO TBDP: ORAL_TABLET | ORAL | 1 refills | Status: DC

## 2019-06-16 NOTE — Progress Notes (Signed)
Faxed cyclosporin level from 11/20 to Joellen Jersey, PharmD at [facility] [phone]. Current dose is 200 mg bid.

## 2019-06-16 NOTE — Progress Notes (Signed)
[place] OFFICE PROGRESS NOTE   Diagnosis: CLL, aplastic anemia  INTERVAL HISTORY:   Meagan Walsh returns as scheduled.  She was transfused and platelets 06/13/2019.  She continues to have intermittent nausea/vomiting.  She vomited on the way to the office today.  She has not taken this morning's dose of cyclosporine.  She has intermittent bleeding from the gums.  She overall feels better since the recent blood transfusion.  Objective:  Vital signs in last 24 hours:  Blood pressure 117/69, pulse 92, temperature 98.7 F (37.1 C), temperature source Temporal, resp. rate 18, height '5\' 6"'$  (1.676 m), weight 138 lb 14.4 oz (63 kg), SpO2 100 %.    HEENT: Gingival hypertrophy.  Bleeding from the right upper anterior gumline. GI: Abdomen soft and nontender.  No hepatosplenomegaly. Vascular: No leg edema. Neuro: Alert and oriented. Left upper extremity PICC without erythema.   Lab Results:  Lab Results  Component Value Date   WBC 1.4 (L) 06/16/2019   HGB 8.9 (L) 06/16/2019   HCT 25.5 (L) 06/16/2019   MCV 83.6 06/16/2019   PLT 15 (L) 06/16/2019   NEUTROABS 0.1 (LL) 06/16/2019    Imaging:  No results found.  Medications: I have reviewed the patient's current medications.  Assessment/Plan: 1. 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 Mild splenomegaly.  Ultrasound-guided biopsy of the left inguinal lymph node 09/18/2017, slightly "purulent "fluid aspirated, core biopsy is consistent with an atypical lymphoid proliferation-extensive necrosis with surrounding epithelioid histiocytes, limited intact lymphoid tissue involved with CLL  Incisional biopsy of a necrotic/purulent left  inguinal lymph node on 10/01/2017-extensive necrosis with granulomatous inflammation, small amount of viable lymphoid tissue involved with CLL, AFB and fungal stains negative  Peripheral blood FISH analysis 02/05/2018-deletion 13q14, no evidence of p53 (17p13) deletion, no evidence of 11q22deletion  Bone marrow biopsy 02/26/2018-hypercellular marrow with extensive involvement by CLL, lymphocytes represent85% of all cells  Ibrutinib initiated 04/03/2018  Ibrutinib placed on hold 04/11/2018 due to onset of arthralgias  Ibrutinib resumed 04/16/2018, discontinued 04/25/2018 secondary to severe arthralgias/arthritis  Ibrutinib resumed at a dose of 140 mg daily 05/03/2018  Ibrutinib dose adjusted to 140 mg alternating with '280mg'$  06/25/2018  Ibrutinib discontinued 07/03/2018 secondary to severe arthralgias  Acalabrutinib 08/16/2018, discontinued 11/15/2018 secondary to persistent severe transfusion dependent anemia and neutropenia/thrombocytopenia, last dose 11/14/2018  Bone marrow biopsy 11/21/2018-decreased cellularity, involvement by CLL, decreased erythroid and granulocytic precursors, decreased megakaryocytes  Cycle 1 rituximab 12/06/2018  Bone marrow biopsy 12/24/2018 at [facility]-hypocellular bone marrow (10%) involved by CLL, representing 50% of marrow cellularity; markedly decreased trilineage hematopoiesis including essentially absent erythropoiesis  Bone marrow biopsy 03/06/2027 [facility]-hypocellular bone marrow (20%) with marked reduction of maturing hematopoietic elements; numerous lymphoid aggregates consistent with involvement by CLL, representing approximately 50% of marrow cellularity by flow cytometry  [facility]cyclosporine at [facility] beginning 03/19/2019 followed by prednisone taper  2.Hypothyroidism 3.Hepatitis B surface and core antibody positive  Hepatitis B surface antigen negative and hepatitis B core antibody -12/02/2018 4.Left lung pneumonia diagnosed 10/08/2017-completed 7 days of Levaquin  5.Left lung pneumoniaon chest x-ray 12/27/2017. Augmentin prescribed. 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 Pancytopenia secondary to CLL and a hypocellular bone marrow  G-CSF and Nplate  started 12/05/2018, G-CSF changed to daily beginning 12/17/2018; G-CSF discontinued 12/31/2018, last Nplate 01/15/2019  Began prednisone 60 mg daily 01/11/19, tolerating moderately well except jitteriness, irritability, and difficulty sleeping. Tapered to 40 mg daily x1 week starting 01/24/19, reduce by 10 mg each week until discontinued. Prednisone discontinued 02/20/2019.  Promacta started 01/20/2019, dose increased to 100 mg daily 02/04/2019; dose increased to 150 mg daily 02/21/2019  IVIG daily for 2 days beginning 02/25/2019   9. Severe headache and nausea/vomiting 02/27/2019-likely related to IVIG therapy,resolved 10.  Intermittent nausea and vomiting following cyclosporine dosing 11.  Gingival hypertrophy 12.  MRI liver 06/12/2019-iron overload in liver and spleen.  No lymphadenopathy or splenomegaly.  Disposition: Meagan Walsh appears unchanged.  She has continued severe pancytopenia.  No blood or platelets planned today.  She will return for a CBC on 06/18/2019 with transfusion support as needed.  Dr. Benay Spice reviewed the MRI results with Meagan Walsh at today's visit.  She understands there is evidence of iron overload in the liver and spleen.  Dr. Benay Spice recommends beginning Exjade.  She will undergo a baseline eye exam.  We will refer Meagan Walsh for auditory examination as well.  We discussed the possibility of diarrhea and nausea/vomiting.  She will return for labs/transfusion support as needed 06/18/2019 and 06/20/2019.  We will see Meagan Walsh in follow-up on 06/23/2019.  She will contact the office in the interim with any  problems.  Patient seen with Dr. Benay Spice.    Ned Card ANP/GNP-BC   06/16/2019  11:48 AM  This was a shared visit with Ned Card.  Meagan Walsh was interviewed and examined.  She continues to have intermittent nausea.  We feel the nausea is secondary to polypharmacy, most likely the cyclosporine.    She has persistent severe pancytopenia, now requiring Red cell and platelet transfusion support.  She has severe prophylaxis.  The cyclosporine level from 06/13/2019 is pending.  We remain hopeful the pancytopenia will improve with a higher cyclosporine level.  If not, the plan is to add Promacta and consider bone marrow transplant.  We will consider tacrolimus if she is unable to tolerate the cyclosporine.  She has iron overload from transfusions.  She will begin Exjade.  We reviewed potential toxicities associated with the Exjade.  She agrees to proceed.  Meagan Walsh husband was present by telephone for today's visit.  [name], MD

## 2019-06-16 NOTE — Telephone Encounter (Signed)
Per Ned Card, NP, reminded pt to schedule appt with her ophthalmologist for baseline prior to beginning Exjade and also a referral was made to ENT for a hearing test. Pt appreciative and verbalized understanding.

## 2019-06-16 NOTE — Progress Notes (Signed)
Call report taken for critical lab [place] of 0.1.  Ned Card, NP notified by desk nurse.  Pt currently seeing Ned Card at this time.

## 2019-06-16 NOTE — Telephone Encounter (Signed)
Scheduled apt per 11/23 sch message - pt aware to come in a little earlier for labs

## 2019-06-16 NOTE — Telephone Encounter (Signed)
-----   Message from Owens Shark, NP sent at 06/16/2019  1:25 PM EST ----- Please remind her to schedule an appointment with her ophthalmologist as soon as possible for baseline examination prior to beginning Exjade.  Also let her know I am making a referral to ENT for hearing test.

## 2019-06-17 ENCOUNTER — Other Ambulatory Visit: Payer: Self-pay | Admitting: Psychiatry

## 2019-06-17 ENCOUNTER — Telehealth: Payer: Self-pay | Admitting: Oncology

## 2019-06-17 ENCOUNTER — Telehealth: Payer: Self-pay | Admitting: *Deleted

## 2019-06-17 DIAGNOSIS — D619 Aplastic anemia, unspecified: Principal | ICD-10-CM

## 2019-06-17 MED ORDER — CYCLOSPORINE MODIFIED 100 MG CAPSULE
ORAL_CAPSULE | 2 refills | 0 days | Status: CP
Start: 2019-06-17 — End: ?

## 2019-06-17 MED ORDER — CYCLOSPORINE MODIFIED 25 MG CAPSULE
ORAL_CAPSULE | 2 refills | 0 days | Status: CP
Start: 2019-06-17 — End: ?

## 2019-06-17 NOTE — Telephone Encounter (Signed)
Scheduled per los. Called and spoke with patient. Confirmed appt 

## 2019-06-17 NOTE — Telephone Encounter (Signed)
Notified patient that [facility] pharmacist has instructed her to reduce her cyclosporin dose to 175 mg bid. Reminded her to hold her am dose of cyclosporin every Friday till after labs are drawn. She verbalized understanding and agrees. She inquires about the Exjade medication. Dr. Benay Spice is ordering today (360 mg tablets--#3 (1080 mg) daily.

## 2019-06-18 ENCOUNTER — Inpatient Hospital Stay: Payer: BC Managed Care – PPO

## 2019-06-18 ENCOUNTER — Telehealth: Payer: Self-pay | Admitting: *Deleted

## 2019-06-18 ENCOUNTER — Other Ambulatory Visit: Payer: Self-pay

## 2019-06-18 ENCOUNTER — Other Ambulatory Visit: Payer: Self-pay | Admitting: Pharmacist

## 2019-06-18 ENCOUNTER — Other Ambulatory Visit: Payer: Self-pay | Admitting: *Deleted

## 2019-06-18 DIAGNOSIS — D696 Thrombocytopenia, unspecified: Secondary | ICD-10-CM

## 2019-06-18 DIAGNOSIS — Z95828 Presence of other vascular implants and grafts: Secondary | ICD-10-CM

## 2019-06-18 DIAGNOSIS — D649 Anemia, unspecified: Secondary | ICD-10-CM

## 2019-06-18 DIAGNOSIS — D801 Nonfamilial hypogammaglobulinemia: Secondary | ICD-10-CM | POA: Diagnosis not present

## 2019-06-18 DIAGNOSIS — Z79899 Other long term (current) drug therapy: Secondary | ICD-10-CM | POA: Diagnosis not present

## 2019-06-18 DIAGNOSIS — E039 Hypothyroidism, unspecified: Secondary | ICD-10-CM | POA: Diagnosis not present

## 2019-06-18 DIAGNOSIS — C9110 Chronic lymphocytic leukemia of B-cell type not having achieved remission: Secondary | ICD-10-CM | POA: Diagnosis not present

## 2019-06-18 LAB — CBC WITH DIFFERENTIAL (CANCER CENTER ONLY)
Abs Immature Granulocytes: 0.01 10*3/uL (ref 0.00–0.07)
Basophils Absolute: 0 10*3/uL (ref 0.0–0.1)
Basophils Relative: 0 %
Eosinophils Absolute: 0.1 10*3/uL (ref 0.0–0.5)
Eosinophils Relative: 2 %
HCT: 25.6 % — ABNORMAL LOW (ref 36.0–46.0)
Hemoglobin: 8.8 g/dL — ABNORMAL LOW (ref 12.0–15.0)
Immature Granulocytes: 0 %
Lymphocytes Relative: 82 %
Lymphs Abs: 1.8 10*3/uL (ref 0.7–4.0)
MCH: 29.1 pg (ref 26.0–34.0)
MCHC: 34.4 g/dL (ref 30.0–36.0)
MCV: 84.8 fL (ref 80.0–100.0)
Monocytes Absolute: 0.2 10*3/uL (ref 0.1–1.0)
Monocytes Relative: 8 %
Neutro Abs: 0.2 10*3/uL — CL (ref 1.7–7.7)
Neutrophils Relative %: 8 %
Platelet Count: 11 10*3/uL — ABNORMAL LOW (ref 150–400)
RBC: 3.02 MIL/uL — ABNORMAL LOW (ref 3.87–5.11)
RDW: 13.4 % (ref 11.5–15.5)
WBC Count: 2.3 10*3/uL — ABNORMAL LOW (ref 4.0–10.5)
nRBC: 0 % (ref 0.0–0.2)

## 2019-06-18 MED ORDER — DEFERASIROX 500 MG PO TBSO: 1000.0000 mg | ORAL_TABLET | Freq: Every day | ORAL | 2 refills | Status: DC

## 2019-06-18 MED ORDER — HEPARIN SOD (PORK) LOCK FLUSH 100 UNIT/ML IV SOLN
250.0000 [IU] | INTRAVENOUS | Status: AC | PRN
  Administered 2019-06-18: 16:00:00 250 [IU]
  Filled 2019-06-18: qty 5

## 2019-06-18 MED ORDER — SODIUM CHLORIDE 0.9% IV SOLUTION
250.0000 mL | Freq: Once | INTRAVENOUS | Status: AC
  Administered 2019-06-18: 15:00:00 250 mL via INTRAVENOUS
  Filled 2019-06-18: qty 250

## 2019-06-18 MED ORDER — HEPARIN SOD (PORK) LOCK FLUSH 100 UNIT/ML IV SOLN
500.0000 [IU] | Freq: Once | INTRAVENOUS | Status: AC | PRN
  Administered 2019-06-18: 500 [IU]
  Filled 2019-06-18: qty 5

## 2019-06-18 MED ORDER — SODIUM CHLORIDE 0.9% FLUSH
10.0000 mL | INTRAVENOUS | Status: AC | PRN
  Administered 2019-06-18: 10 mL
  Filled 2019-06-18: qty 10

## 2019-06-18 MED ORDER — SODIUM CHLORIDE 0.9% FLUSH
10.0000 mL | INTRAVENOUS | Status: DC | PRN
  Administered 2019-06-18: 10 mL
  Filled 2019-06-18: qty 10

## 2019-06-18 NOTE — Telephone Encounter (Signed)
Patient called to confirm the cyclosporin dose change: asking if 175 or 275 mg bid. Confirmed with her it is 175 mg bid. Inquired what dose she took last night and this morning and she reports she took 175 mg. Has not yet made her eye exam appointment, but will call [facility] for this. She has not heard from [facility] yet. Faxed demographics, labs and last office note w/referral order to [facility] and [employer].

## 2019-06-18 NOTE — Patient Instructions (Signed)
Platelet Transfusion A platelet transfusion is a procedure in which you receive donated platelets through an IV. Platelets are tiny pieces of blood cells. When you get an injury, platelets clump together in the area to form a blood clot. This helps stop bleeding and is the beginning of the healing process. If you have too few platelets, your blood may have trouble clotting. This may cause you to bleed and bruise very easily. You may need a platelet transfusion if you have a condition that causes a low number of platelets (thrombocytopenia). A platelet transfusion may be used to stop or prevent excessive bleeding. Tell a health care provider about:  Any reactions you have had during previous transfusions.  Any allergies you have.  All medicines you are taking, including vitamins, herbs, eye drops, creams, and over-the-counter medicines.  Any blood disorders you have.  Any surgeries you have had.  Any medical conditions you have.  Whether you are pregnant or may be pregnant. What are the risks? Generally, this is a safe procedure. However, problems may occur, including:  Fever.  Infection.  Allergic reaction to the donor platelets.  Your body's disease-fighting system (immune system) attacking the donor platelets (hemolytic reaction). This is rare.  A rare reaction that causes lung damage (transfusion-related acute lung injury). What happens before the procedure? Medicines  Ask your health care provider about: ? Changing or stopping your regular medicines. This is especially important if you are taking diabetes medicines or blood thinners. ? Taking medicines such as aspirin and ibuprofen. These medicines can thin your blood. Do not take these medicines unless your health care provider tells you to take them. ? Taking over-the-counter medicines, vitamins, herbs, and supplements. General instructions  You will have a blood test to determine your blood type. Your blood type  determines what kind of platelets you will be given.  Follow instructions from your health care provider about eating or drinking restrictions.  If you have had an allergic reaction to a transfusion in the past, you may be given medicine to help prevent a reaction.  Your temperature, blood pressure, pulse, and breathing will be monitored. What happens during the procedure?   An IV will be inserted into one of your veins.  For your safety, two health care providers will verify your identity along with the donor platelets about to be infused.  A bag of donor platelets will be connected to your IV. The platelets will flow into your bloodstream. This usually takes 30-60 minutes.  Your temperature, blood pressure, pulse, and breathing will be monitored during the transfusion. This helps detect early signs of any reaction.  You will also be monitored for other symptoms that may indicate a reaction, including chills, hives, or itching.  If you have signs of a reaction at any time, your transfusion will be stopped, and you may be given medicine to help manage the reaction.  When your transfusion is complete, your IV will be removed.  Pressure may be applied to the IV site for a few minutes to stop any bleeding.  The IV site will be covered with a bandage (dressing). The procedure may vary among health care providers and hospitals. What happens after the procedure?  Your blood pressure, temperature, pulse, and breathing will be monitored until you leave the hospital or clinic.  You may have some bruising and soreness at your IV site. Follow these instructions at home: Medicines  Take over-the-counter and prescription medicines only as told by your health care provider.  Talk with your health care provider before you take any medicines that contain aspirin or NSAIDs. These medicines increase your risk for dangerous bleeding. General instructions  Change or remove your dressing as told  by your health care provider.  Return to your normal activities as told by your health care provider. Ask your health care provider what activities are safe for you.  Do not take baths, swim, or use a hot tub until your health care provider approves. Ask your health care provider if you may take showers.  Check your IV site every day for signs of infection. Check for: ? Redness, swelling, or pain. ? Fluid or blood. If fluid or blood drains from your IV site, use your hands to press down firmly on a bandage covering the area for a minute or two. Doing this should stop the bleeding. ? Warmth. ? Pus or a bad smell.  Keep all follow-up visits as told by your health care provider. This is important. Contact a health care provider if you have:  A headache that does not go away with medicine.  Hives, rash, or itchy skin.  Nausea or vomiting.  Unusual tiredness or weakness.  Signs of infection at your IV site. Get help right away if:  You have a fever or chills.  You urinate less often than usual.  Your urine is darker colored than normal.  You have any of the following: ? Trouble breathing. ? Pain in your back, abdomen, or chest. ? Cool, clammy skin. ? A fast heartbeat. Summary  Platelets are tiny pieces of blood cells that clump together to form a blood clot when you have an injury. If you have too few platelets, your blood may have trouble clotting.  A platelet transfusion is a procedure in which you receive donated platelets through an IV.  A platelet transfusion may be used to stop or prevent excessive bleeding.  After the procedure, check your IV site every day for signs of infection, including redness, swelling, pain, or warmth. This information is not intended to replace advice given to you by your health care provider. Make sure you discuss any questions you have with your health care provider. Document Released: 05/07/2007 Document Revised: 08/15/2017 Document  Reviewed: 08/15/2017 Elsevier Patient Education  2020 [place].   Blood Transfusion, Adult, Care After This sheet gives you information about how to care for yourself after your procedure. Your doctor may also give you more specific instructions. If you have problems or questions, contact your doctor. Follow these instructions at home:   Take over-the-counter and prescription medicines only as told by your doctor.  Go back to your normal activities as told by your doctor.  Follow instructions from your doctor about how to take care of the area where an IV tube was put into your vein (insertion site). Make sure you: ? Wash your hands with soap and water before you change your bandage (dressing). If there is no soap and water, use hand sanitizer. ? Change your bandage as told by your doctor.  Check your IV insertion site every day for signs of infection. Check for: ? More redness, swelling, or pain. ? More fluid or blood. ? Warmth. ? Pus or a bad smell. Contact a doctor if:  You have more redness, swelling, or pain around the IV insertion site.  You have more fluid or blood coming from the IV insertion site.  Your IV insertion site feels warm to the touch.  You have pus or  a bad smell coming from the IV insertion site.  Your pee (urine) turns pink, red, or brown.  You feel weak after doing your normal activities. Get help right away if:  You have signs of a serious allergic or body defense (immune) system reaction, including: ? Itchiness. ? Hives. ? Trouble breathing. ? Anxiety. ? Pain in your chest or lower back. ? Fever, flushing, and chills. ? Fast pulse. ? Rash. ? Watery poop (diarrhea). ? Throwing up (vomiting). ? Dark pee. ? Serious headache. ? Dizziness. ? Stiff neck. ? Yellow color in your face or the white parts of your eyes (jaundice). Summary  After a blood transfusion, return to your normal activities as told by your doctor.  Every day, check  for signs of infection where the IV tube was put into your vein.  Some signs of infection are warm skin, more redness and pain, more fluid or blood, and pus or a bad smell where the needle went in.  Contact your doctor if you feel weak or have any unusual symptoms. This information is not intended to replace advice given to you by your health care provider. Make sure you discuss any questions you have with your health care provider. Document Released: 07/31/2014 Document Revised: 11/14/2017 Document Reviewed: 03/03/2016 Elsevier Patient Education  2020 [employer].

## 2019-06-18 NOTE — Progress Notes (Signed)
Needs platelets today. Blood bank notified to prepare now. Will be ready in 5 minutes.

## 2019-06-18 NOTE — Progress Notes (Signed)
CRITICAL VALUE STICKER  CRITICAL VALUE:  ANC 0.2  RECEIVER (on-site recipient of call): Valda Favia RN  DATE & TIME NOTIFIED:   06/18/2019 1500  MESSENGER (representative from lab): Suanne Marker  MD NOTIFIED: Ned Card NP  TIME OF NOTIFICATION: 1503  RESPONSE: no new orders at this time

## 2019-06-18 NOTE — Progress Notes (Signed)
Per Dr. Benay Spice: Dose of Exjade will be 500 mg tablet--take #2 (1000 mg) po daily. Script sent to [facility].

## 2019-06-19 LAB — BPAM PLATELET PHERESIS
Blood Product Expiration Date: 202011252359
ISSUE DATE / TIME: 202011251509
Unit Type and Rh: 7300

## 2019-06-19 LAB — PREPARE PLATELET PHERESIS: Unit division: 0

## 2019-06-20 ENCOUNTER — Other Ambulatory Visit: Payer: Self-pay | Admitting: *Deleted

## 2019-06-20 ENCOUNTER — Inpatient Hospital Stay: Payer: BC Managed Care – PPO

## 2019-06-20 ENCOUNTER — Other Ambulatory Visit: Payer: Self-pay

## 2019-06-20 ENCOUNTER — Telehealth: Payer: Self-pay | Admitting: Oncology

## 2019-06-20 VITALS — BP 122/80 | HR 66 | Temp 98.4°F | Resp 17

## 2019-06-20 DIAGNOSIS — E039 Hypothyroidism, unspecified: Secondary | ICD-10-CM | POA: Diagnosis not present

## 2019-06-20 DIAGNOSIS — C9110 Chronic lymphocytic leukemia of B-cell type not having achieved remission: Secondary | ICD-10-CM

## 2019-06-20 DIAGNOSIS — D649 Anemia, unspecified: Secondary | ICD-10-CM

## 2019-06-20 DIAGNOSIS — D696 Thrombocytopenia, unspecified: Secondary | ICD-10-CM

## 2019-06-20 DIAGNOSIS — D801 Nonfamilial hypogammaglobulinemia: Secondary | ICD-10-CM | POA: Diagnosis not present

## 2019-06-20 DIAGNOSIS — Z95828 Presence of other vascular implants and grafts: Secondary | ICD-10-CM

## 2019-06-20 DIAGNOSIS — Z79899 Other long term (current) drug therapy: Secondary | ICD-10-CM | POA: Diagnosis not present

## 2019-06-20 LAB — CBC WITH DIFFERENTIAL (CANCER CENTER ONLY)
Abs Immature Granulocytes: 0.06 10*3/uL (ref 0.00–0.07)
Basophils Absolute: 0 10*3/uL (ref 0.0–0.1)
Basophils Relative: 0 %
Eosinophils Absolute: 0.1 10*3/uL (ref 0.0–0.5)
Eosinophils Relative: 4 %
HCT: 21.8 % — ABNORMAL LOW (ref 36.0–46.0)
Hemoglobin: 7.4 g/dL — ABNORMAL LOW (ref 12.0–15.0)
Immature Granulocytes: 4 %
Lymphocytes Relative: 74 %
Lymphs Abs: 1 10*3/uL (ref 0.7–4.0)
MCH: 28.6 pg (ref 26.0–34.0)
MCHC: 33.9 g/dL (ref 30.0–36.0)
MCV: 84.2 fL (ref 80.0–100.0)
Monocytes Absolute: 0.2 10*3/uL (ref 0.1–1.0)
Monocytes Relative: 12 %
Neutro Abs: 0.1 10*3/uL — CL (ref 1.7–7.7)
Neutrophils Relative %: 6 %
Platelet Count: 8 10*3/uL — CL (ref 150–400)
RBC: 2.59 MIL/uL — ABNORMAL LOW (ref 3.87–5.11)
RDW: 13.3 % (ref 11.5–15.5)
WBC Count: 1.5 10*3/uL — ABNORMAL LOW (ref 4.0–10.5)
nRBC: 0 % (ref 0.0–0.2)

## 2019-06-20 LAB — CMP (CANCER CENTER ONLY)
ALT: 17 U/L (ref 0–44)
AST: 17 U/L (ref 15–41)
Albumin: 3.3 g/dL — ABNORMAL LOW (ref 3.5–5.0)
Alkaline Phosphatase: 107 U/L (ref 38–126)
Anion gap: 6 (ref 5–15)
BUN: 22 mg/dL — ABNORMAL HIGH (ref 6–20)
CO2: 24 mmol/L (ref 22–32)
Calcium: 8.9 mg/dL (ref 8.9–10.3)
Chloride: 108 mmol/L (ref 98–111)
Creatinine: 1.27 mg/dL — ABNORMAL HIGH (ref 0.44–1.00)
GFR, Est AFR Am: 53 mL/min — ABNORMAL LOW (ref >60)
GFR, Estimated: 46 mL/min — ABNORMAL LOW (ref >60)
Glucose, Bld: 114 mg/dL — ABNORMAL HIGH (ref 70–99)
Potassium: 4.5 mmol/L (ref 3.5–5.1)
Sodium: 138 mmol/L (ref 135–145)
Total Bilirubin: 0.8 mg/dL (ref 0.3–1.2)
Total Protein: 5.4 g/dL — ABNORMAL LOW (ref 6.5–8.1)

## 2019-06-20 LAB — PREPARE RBC (CROSSMATCH)

## 2019-06-20 LAB — PLATELET COUNT (CANCER CENTER ONLY): Platelet Count: 26 10*3/uL — ABNORMAL LOW (ref 150–400)

## 2019-06-20 MED ORDER — HEPARIN SOD (PORK) LOCK FLUSH 100 UNIT/ML IV SOLN
250.0000 [IU] | INTRAVENOUS | Status: AC | PRN
  Administered 2019-06-20: 250 [IU]
  Filled 2019-06-20: qty 5

## 2019-06-20 MED ORDER — SODIUM CHLORIDE 0.9% FLUSH
10.0000 mL | INTRAVENOUS | Status: DC | PRN
  Administered 2019-06-20: 10 mL
  Filled 2019-06-20: qty 10

## 2019-06-20 MED ORDER — SODIUM CHLORIDE 0.9% FLUSH
3.0000 mL | INTRAVENOUS | Status: AC | PRN
  Administered 2019-06-20: 3 mL
  Filled 2019-06-20: qty 10

## 2019-06-20 MED ORDER — SODIUM CHLORIDE 0.9% IV SOLUTION
250.0000 mL | Freq: Once | INTRAVENOUS | Status: AC
  Administered 2019-06-20: 250 mL via INTRAVENOUS
  Filled 2019-06-20: qty 250

## 2019-06-20 NOTE — Telephone Encounter (Signed)
Added infusion 11/30 after f/u visit. Not able to reach patient or leave message - voicemail full.

## 2019-06-20 NOTE — Patient Instructions (Signed)
PICC Home Care Guide ° °A peripherally inserted central catheter (PICC) is a form of IV access that allows medicines and IV fluids to be quickly distributed throughout the body. The PICC is a long, thin, flexible tube (catheter) that is inserted into a vein in the upper arm. The catheter ends in a large vein in the chest (superior vena cava, or SVC). After the PICC is inserted, a chest X-ray may be done to make sure that it is in the correct place. °A PICC may be placed for different reasons, such as: °· To give medicines and liquid nutrition. °· To give IV fluids and blood products. °· If there is trouble placing a peripheral intravenous (PIV) catheter. °If taken care of properly, a PICC can remain in place for several months. Having a PICC can also allow a person to go home from the hospital sooner. Medicine and PICC care can be managed at home by a family member, caregiver, or home health care team. °What are the risks? °Generally, having a PICC is safe. However, problems may occur, including: °· A blood clot (thrombus) forming in or at the tip of the PICC. °· A blood clot forming in a vein (deep vein thrombosis) or traveling to the lung (pulmonary embolism). °· Inflammation of the vein (phlebitis) in which the PICC is placed. °· Infection. Central line associated blood stream infection (CLABSI) is a serious infection that often requires hospitalization. °· PICC movement (malposition). The PICC tip may move from its original position due to excessive physical activity, forceful coughing, sneezing, or vomiting. °· A break or cut in the PICC. It is important not to use scissors near the PICC. °· Nerve or tendon irritation or injury during PICC insertion. °How to take care of your PICC °Preventing problems °· You and any caregivers should wash your hands often with soap. Wash hands: °? Before touching the PICC line or the infusion device. °? Before changing a bandage (dressing). °· Flush the PICC as told by your  health care provider. Let your health care provider know right away if the PICC is hard to flush or does not flush. Do not use force to flush the PICC. °· Do not use a syringe that is less than 10 mL to flush the PICC. °· Avoid blood pressure checks on the arm in which the PICC is placed. °· Never pull or tug on the PICC. °· Do not take the PICC out yourself. Only a trained clinical professional should remove the PICC. °· Use clean and sterile supplies only. Keep the supplies in a dry place. Do not reuse needles, syringes, or any other supplies. Doing that can lead to infection. °· Keep pets and children away from your PICC line. °· Check the PICC insertion site every day for signs of infection. Check for: °? Leakage. °? Redness, swelling, or pain. °? Fluid or blood. °? Warmth. °? Pus or a bad smell. °PICC dressing care °· Keep your PICC bandage (dressing) clean and dry to prevent infection. °· Do not take baths, swim, or use a hot tub until your health care provider approves. Ask your health care provider if you can take showers. You may only be allowed to take sponge baths for bathing. When you are allowed to shower: °? Ask your health care provider to teach you how to wrap the PICC line. °? Cover the PICC line with clear plastic wrap and tape to keep it dry while showering. °· Follow instructions from your health care provider   about how to take care of your insertion site and dressing. Make sure you: °? Wash your hands with soap and water before you change your bandage (dressing). If soap and water are not available, use hand sanitizer. °? Change your dressing as told by your health care provider. °? Leave stitches (sutures), skin glue, or adhesive strips in place. These skin closures may need to stay in place for 2 weeks or longer. If adhesive strip edges start to loosen and curl up, you may trim the loose edges. Do not remove adhesive strips completely unless your health care provider tells you to do  that. °· Change your PICC dressing if it becomes loose or wet. °General instructions ° °· Carry your PICC identification card or wear a medical alert bracelet at all times. °· Keep the tube clamped at all times, unless it is being used. °· Carry a smooth-edge clamp with you at all times to place on the tube if it breaks. °· Do not use scissors or sharp objects near the tube. °· You may bend your arm and move it freely. If your PICC is near or at the bend of your elbow, avoid activity with repeated motion at the elbow. °· Avoid lifting heavy objects as told by your health care provider. °· Keep all follow-up visits as told by your health care provider. This is important. °Disposal of supplies °· Throw away any syringes in a disposal container that is meant for sharp items (sharps container). You can buy a sharps container from a pharmacy, or you can make one by using an empty hard plastic bottle with a cover. °· Place any used dressings or infusion bags into a plastic bag. Throw that bag in the trash. °Contact a health care provider if: °· You have pain in your arm, ear, face, or teeth. °· You have a fever or chills. °· You have redness, swelling, or pain around the insertion site. °· You have fluid or blood coming from the insertion site. °· Your insertion site feels warm to the touch. °· You have pus or a bad smell coming from the insertion site. °· Your skin feels hard and raised around the insertion site. °Get help right away if: °· Your PICC is accidentally pulled all the way out. If this happens, cover the insertion site with a bandage or gauze dressing. Do not throw the PICC away. Your health care provider will need to check it. °· Your PICC was tugged or pulled and has partially come out. Do not  push the PICC back in. °· You cannot flush the PICC, it is hard to flush, or the PICC leaks around the insertion site when it is flushed. °· You hear a "flushing" sound when the PICC is flushed. °· You feel your  heart racing or skipping beats. °· There is a hole or tear in the PICC. °· You have swelling in the arm in which the PICC was inserted. °· You have a red streak going up your arm from where the PICC was inserted. °Summary °· A peripherally inserted central catheter (PICC) is a long, thin, flexible tube (catheter) that is inserted into a vein in the upper arm. °· The PICC is inserted using a sterile technique by a specially trained nurse or physician. Only a trained clinical professional should remove it. °· Keep your PICC identification card with you at all times. °· Avoid blood pressure checks on the arm in which the PICC is placed. °· If cared for   properly, a PICC can remain in place for several months. Having a PICC can also allow a person to go home from the hospital sooner. °This information is not intended to replace advice given to you by your health care provider. Make sure you discuss any questions you have with your health care provider. °Document Released: 01/14/2003 Document Revised: 06/22/2017 Document Reviewed: 08/12/2016 °Elsevier Patient Education © 2020 Elsevier Inc. ° °

## 2019-06-20 NOTE — Progress Notes (Signed)
Notified patient that per Dr. Benay Spice, when she starts the Exjade, reduce to 500 mg daily (also provided this in writing). Med still in prior authorization status. Notified treatment nurse to do 30 minute post platelet count per Dr. Benay Spice.

## 2019-06-20 NOTE — Progress Notes (Signed)
Called blood bank and confirmed that orders are seen for 1 unit blood and 1 unit random platelets today. New order entered for Type and screen for crossmatched platelet for Monday.

## 2019-06-20 NOTE — Progress Notes (Signed)
With patient's consent, right arm stuck by Chasidy Robinson-LPN to draw post platelet count so PRBC did not have to be paused and disconnected to draw sample from PICC

## 2019-06-20 NOTE — Patient Instructions (Signed)
Blood Transfusion, Adult, Care After This sheet gives you information about how to care for yourself after your procedure. Your doctor may also give you more specific instructions. If you have problems or questions, contact your doctor. Follow these instructions at home:   Take over-the-counter and prescription medicines only as told by your doctor.  Go back to your normal activities as told by your doctor.  Follow instructions from your doctor about how to take care of the area where an IV tube was put into your vein (insertion site). Make sure you: ? Wash your hands with soap and water before you change your bandage (dressing). If there is no soap and water, use hand sanitizer. ? Change your bandage as told by your doctor.  Check your IV insertion site every day for signs of infection. Check for: ? More redness, swelling, or pain. ? More fluid or blood. ? Warmth. ? Pus or a bad smell. Contact a doctor if:  You have more redness, swelling, or pain around the IV insertion site.  You have more fluid or blood coming from the IV insertion site.  Your IV insertion site feels warm to the touch.  You have pus or a bad smell coming from the IV insertion site.  Your pee (urine) turns pink, red, or brown.  You feel weak after doing your normal activities. Get help right away if:  You have signs of a serious allergic or body defense (immune) system reaction, including: ? Itchiness. ? Hives. ? Trouble breathing. ? Anxiety. ? Pain in your chest or lower back. ? Fever, flushing, and chills. ? Fast pulse. ? Rash. ? Watery poop (diarrhea). ? Throwing up (vomiting). ? Dark pee. ? Serious headache. ? Dizziness. ? Stiff neck. ? Yellow color in your face or the white parts of your eyes (jaundice). Summary  After a blood transfusion, return to your normal activities as told by your doctor.  Every day, check for signs of infection where the IV tube was put into your vein.  Some  signs of infection are warm skin, more redness and pain, more fluid or blood, and pus or a bad smell where the needle went in.  Contact your doctor if you feel weak or have any unusual symptoms. This information is not intended to replace advice given to you by your health care provider. Make sure you discuss any questions you have with your health care provider. Document Released: 07/31/2014 Document Revised: 11/14/2017 Document Reviewed: 03/03/2016 Elsevier Patient Education  [place].  Platelet Transfusion A platelet transfusion is a procedure in which you receive donated platelets through an IV. Platelets are tiny pieces of blood cells. When you get an injury, platelets clump together in the area to form a blood clot. This helps stop bleeding and is the beginning of the healing process. If you have too few platelets, your blood may have trouble clotting. This may cause you to bleed and bruise very easily. You may need a platelet transfusion if you have a condition that causes a low number of platelets (thrombocytopenia). A platelet transfusion may be used to stop or prevent excessive bleeding. Tell a health care provider about:  Any reactions you have had during previous transfusions.  Any allergies you have.  All medicines you are taking, including vitamins, herbs, eye drops, creams, and over-the-counter medicines.  Any blood disorders you have.  Any surgeries you have had.  Any medical conditions you have.  Whether you are pregnant or may be pregnant.  What are the risks? Generally, this is a safe procedure. However, problems may occur, including:  Fever.  Infection.  Allergic reaction to the donor platelets.  Your body's disease-fighting system (immune system) attacking the donor platelets (hemolytic reaction). This is rare.  A rare reaction that causes lung damage (transfusion-related acute lung injury). What happens before the procedure? Medicines  Ask your  health care provider about: ? Changing or stopping your regular medicines. This is especially important if you are taking diabetes medicines or blood thinners. ? Taking medicines such as aspirin and ibuprofen. These medicines can thin your blood. Do not take these medicines unless your health care provider tells you to take them. ? Taking over-the-counter medicines, vitamins, herbs, and supplements. General instructions  You will have a blood test to determine your blood type. Your blood type determines what kind of platelets you will be given.  Follow instructions from your health care provider about eating or drinking restrictions.  If you have had an allergic reaction to a transfusion in the past, you may be given medicine to help prevent a reaction.  Your temperature, blood pressure, pulse, and breathing will be monitored. What happens during the procedure?   An IV will be inserted into one of your veins.  For your safety, two health care providers will verify your identity along with the donor platelets about to be infused.  A bag of donor platelets will be connected to your IV. The platelets will flow into your bloodstream. This usually takes 30-60 minutes.  Your temperature, blood pressure, pulse, and breathing will be monitored during the transfusion. This helps detect early signs of any reaction.  You will also be monitored for other symptoms that may indicate a reaction, including chills, hives, or itching.  If you have signs of a reaction at any time, your transfusion will be stopped, and you may be given medicine to help manage the reaction.  When your transfusion is complete, your IV will be removed.  Pressure may be applied to the IV site for a few minutes to stop any bleeding.  The IV site will be covered with a bandage (dressing). The procedure may vary among health care providers and hospitals. What happens after the procedure?  Your blood pressure, temperature,  pulse, and breathing will be monitored until you leave the hospital or clinic.  You may have some bruising and soreness at your IV site. Follow these instructions at home: Medicines  Take over-the-counter and prescription medicines only as told by your health care provider.  Talk with your health care provider before you take any medicines that contain aspirin or NSAIDs. These medicines increase your risk for dangerous bleeding. General instructions  Change or remove your dressing as told by your health care provider.  Return to your normal activities as told by your health care provider. Ask your health care provider what activities are safe for you.  Do not take baths, swim, or use a hot tub until your health care provider approves. Ask your health care provider if you may take showers.  Check your IV site every day for signs of infection. Check for: ? Redness, swelling, or pain. ? Fluid or blood. If fluid or blood drains from your IV site, use your hands to press down firmly on a bandage covering the area for a minute or two. Doing this should stop the bleeding. ? Warmth. ? Pus or a bad smell.  Keep all follow-up visits as told by your health care provider.  This is important. Contact a health care provider if you have:  A headache that does not go away with medicine.  Hives, rash, or itchy skin.  Nausea or vomiting.  Unusual tiredness or weakness.  Signs of infection at your IV site. Get help right away if:  You have a fever or chills.  You urinate less often than usual.  Your urine is darker colored than normal.  You have any of the following: ? Trouble breathing. ? Pain in your back, abdomen, or chest. ? Cool, clammy skin. ? A fast heartbeat. Summary  Platelets are tiny pieces of blood cells that clump together to form a blood clot when you have an injury. If you have too few platelets, your blood may have trouble clotting.  A platelet transfusion is a  procedure in which you receive donated platelets through an IV.  A platelet transfusion may be used to stop or prevent excessive bleeding.  After the procedure, check your IV site every day for signs of infection, including redness, swelling, pain, or warmth. This information is not intended to replace advice given to you by your health care provider. Make sure you discuss any questions you have with your health care provider. Document Released: 05/07/2007 Document Revised: 08/15/2017 Document Reviewed: 08/15/2017 Elsevier Patient Education  2020 [place] (COVID-19) Are you at risk?  Are you at risk for the Coronavirus (COVID-19)?  To be considered HIGH RISK for Coronavirus (COVID-19), you have to meet the following criteria:  . Traveled to [place], [place], [place], [place] or [place]; or in the [place] to [place], [place], [place], or [place]; and have fever, cough, and shortness of breath within the last 2 weeks of travel OR . Been in close contact with a person diagnosed with COVID-19 within the last 2 weeks and have fever, cough, and shortness of breath . IF YOU DO NOT MEET THESE CRITERIA, YOU ARE CONSIDERED LOW RISK FOR COVID-19.  What to do if you are HIGH RISK for COVID-19?  [name] If you are having a medical emergency, call 911. . Seek medical care right away. Before you go to a doctor's office, urgent care or emergency department, call ahead and tell them about your recent travel, contact with someone diagnosed with COVID-19, and your symptoms. You should receive instructions from your physician's office regarding next steps of care.  . When you arrive at healthcare provider, tell the healthcare staff immediately you have returned from visiting [place], [place], [place], [place] or [place]; or traveled in the [place] to [place], [place], [place], or [place]; in the last two weeks or you have been in close contact with a person diagnosed with  COVID-19 in the last 2 weeks.   . Tell the health care staff about your symptoms: fever, cough and shortness of breath. . After you have been seen by a medical provider, you will be either: o Tested for (COVID-19) and discharged home on quarantine except to seek medical care if symptoms worsen, and asked to  - Stay home and avoid contact with others until you get your results (4-5 days)  - Avoid travel on public transportation if possible (such as bus, train, or airplane) or o Sent to the Emergency Department by EMS for evaluation, COVID-19 testing, and possible admission depending on your condition and test results.  What to do if you are LOW RISK for COVID-19?  Reduce your risk of any infection by using the same  precautions used for avoiding the common cold or flu:  [name] Wash your hands often with soap and warm water for at least 20 seconds.  If soap and water are not readily available, use an alcohol-based hand sanitizer with at least 60% alcohol.  . If coughing or sneezing, cover your mouth and nose by coughing or sneezing into the elbow areas of your shirt or coat, into a tissue or into your sleeve (not your hands). . Avoid shaking hands with others and consider head nods or verbal greetings only. . Avoid touching your eyes, nose, or mouth with unwashed hands.  . Avoid close contact with people who are sick. . Avoid places or events with large numbers of people in one location, like concerts or sporting events. . Carefully consider travel plans you have or are making. . If you are planning any travel outside or inside the [place], visit the CDC's Travelers' Health webpage for the latest health notices. . If you have some symptoms but not all symptoms, continue to monitor at home and seek medical attention if your symptoms worsen. . If you are having a medical emergency, call 911.   [place] / e-Visit:  [email or web address]         [facility]: [facility]: [id number]                   [facility]: [phone]

## 2019-06-21 LAB — SAMPLE TO BLOOD BANK

## 2019-06-23 ENCOUNTER — Inpatient Hospital Stay: Payer: BC Managed Care – PPO

## 2019-06-23 ENCOUNTER — Telehealth: Payer: Self-pay | Admitting: *Deleted

## 2019-06-23 ENCOUNTER — Inpatient Hospital Stay (HOSPITAL_BASED_OUTPATIENT_CLINIC_OR_DEPARTMENT_OTHER): Payer: BC Managed Care – PPO | Admitting: Nurse Practitioner

## 2019-06-23 ENCOUNTER — Other Ambulatory Visit: Payer: Self-pay

## 2019-06-23 ENCOUNTER — Encounter: Payer: Self-pay | Admitting: Nurse Practitioner

## 2019-06-23 ENCOUNTER — Telehealth: Payer: Self-pay | Admitting: Pharmacist

## 2019-06-23 ENCOUNTER — Ambulatory Visit: Payer: BC Managed Care – PPO

## 2019-06-23 VITALS — BP 119/72 | HR 83 | Temp 98.3°F | Resp 17 | Ht 66.0 in | Wt 137.4 lb

## 2019-06-23 DIAGNOSIS — D649 Anemia, unspecified: Secondary | ICD-10-CM

## 2019-06-23 DIAGNOSIS — C9110 Chronic lymphocytic leukemia of B-cell type not having achieved remission: Secondary | ICD-10-CM | POA: Diagnosis not present

## 2019-06-23 DIAGNOSIS — D696 Thrombocytopenia, unspecified: Secondary | ICD-10-CM

## 2019-06-23 DIAGNOSIS — E039 Hypothyroidism, unspecified: Secondary | ICD-10-CM | POA: Diagnosis not present

## 2019-06-23 DIAGNOSIS — D801 Nonfamilial hypogammaglobulinemia: Secondary | ICD-10-CM | POA: Diagnosis not present

## 2019-06-23 DIAGNOSIS — Z79899 Other long term (current) drug therapy: Secondary | ICD-10-CM | POA: Diagnosis not present

## 2019-06-23 LAB — PREPARE PLATELET PHERESIS: Unit division: 0

## 2019-06-23 LAB — CBC WITH DIFFERENTIAL (CANCER CENTER ONLY)
Abs Immature Granulocytes: 0 10*3/uL (ref 0.00–0.07)
Basophils Absolute: 0 10*3/uL (ref 0.0–0.1)
Basophils Relative: 0 %
Eosinophils Absolute: 0 10*3/uL (ref 0.0–0.5)
Eosinophils Relative: 2 %
HCT: 27.7 % — ABNORMAL LOW (ref 36.0–46.0)
Hemoglobin: 9.6 g/dL — ABNORMAL LOW (ref 12.0–15.0)
Immature Granulocytes: 0 %
Lymphocytes Relative: 77 %
Lymphs Abs: 1.5 10*3/uL (ref 0.7–4.0)
MCH: 29.5 pg (ref 26.0–34.0)
MCHC: 34.7 g/dL (ref 30.0–36.0)
MCV: 85.2 fL (ref 80.0–100.0)
Monocytes Absolute: 0.2 10*3/uL (ref 0.1–1.0)
Monocytes Relative: 10 %
Neutro Abs: 0.2 10*3/uL — CL (ref 1.7–7.7)
Neutrophils Relative %: 11 %
Platelet Count: 14 10*3/uL — ABNORMAL LOW (ref 150–400)
RBC: 3.25 MIL/uL — ABNORMAL LOW (ref 3.87–5.11)
RDW: 13.4 % (ref 11.5–15.5)
WBC Count: 2 10*3/uL — ABNORMAL LOW (ref 4.0–10.5)
nRBC: 0 % (ref 0.0–0.2)

## 2019-06-23 LAB — BPAM PLATELET PHERESIS
Blood Product Expiration Date: 202011282359
ISSUE DATE / TIME: 202011271343
Unit Type and Rh: 7300

## 2019-06-23 LAB — TYPE AND SCREEN
ABO/RH(D): O POS
Antibody Screen: NEGATIVE
Unit division: 0

## 2019-06-23 LAB — CMP (CANCER CENTER ONLY)
ALT: 25 U/L (ref 0–44)
AST: 31 U/L (ref 15–41)
Albumin: 3.6 g/dL (ref 3.5–5.0)
Alkaline Phosphatase: 113 U/L (ref 38–126)
Anion gap: 10 (ref 5–15)
BUN: 18 mg/dL (ref 6–20)
CO2: 24 mmol/L (ref 22–32)
Calcium: 9.8 mg/dL (ref 8.9–10.3)
Chloride: 104 mmol/L (ref 98–111)
Creatinine: 1.1 mg/dL — ABNORMAL HIGH (ref 0.44–1.00)
GFR, Est AFR Am: >60 mL/min (ref >60)
GFR, Estimated: 55 mL/min — ABNORMAL LOW (ref >60)
Glucose, Bld: 117 mg/dL — ABNORMAL HIGH (ref 70–99)
Potassium: 4.4 mmol/L (ref 3.5–5.1)
Sodium: 138 mmol/L (ref 135–145)
Total Bilirubin: 0.9 mg/dL (ref 0.3–1.2)
Total Protein: 5.9 g/dL — ABNORMAL LOW (ref 6.5–8.1)

## 2019-06-23 LAB — BPAM RBC
Blood Product Expiration Date: 202012252359
ISSUE DATE / TIME: 202011271445
Unit Type and Rh: 5100

## 2019-06-23 LAB — SAMPLE TO BLOOD BANK

## 2019-06-23 LAB — CYCLOSPORINE: Cyclosporine, LabCorp: 198 ng/mL (ref 100–400)

## 2019-06-23 MED ORDER — DEFERASIROX 500 MG PO TBSO: 1000.0000 mg | ORAL_TABLET | Freq: Every day | ORAL | 2 refills | Status: DC

## 2019-06-23 NOTE — Telephone Encounter (Addendum)
Faxed cyclosporin results to [facility] pharmacist with notation that she is on cyclosporin 175 mg bid. Have not heard back from [facility]. Will f/u with pharmacist tomorrow. Patient reports she has appointment with [facility] for 07/15/19--is this soon enough? Starts Exjade on 06/24/19. Per Dr. Benay Spice : 12/22 is OK

## 2019-06-23 NOTE — Telephone Encounter (Signed)
Oral Chemotherapy Pharmacist Encounter   I spoke with patient in exam room for overview of: Exjade (defeasirox) for the treatment of iron overload in a patient who is transfusion dependent, planned duration until adequate response or unacceptable toxicity.  Counseled patient on administration, dosing, side effects, monitoring, drug-food interactions, safe handling, storage, and disposal.  Patient will start Exjade at 1/2 dose with eventual increase to full dose. Dose decreased at initiation due to reduced renal function on labs from 06/20/19, renal function almost back to normal on lab check today.  At initiation: Patient will take Exjade 500mg  tablets, 1 tablet (500mg ) once daily on an empty stomach at least 30 minutes before food, preferably at the same time each day.   Full dose: Patient will take Exjade 500mg  tablets, 2 tablets (1000mg ) once daily on an empty stomach at least 30 minutes before food, preferably at the same time each day.   Patient knows to not chew tablets or swallow them whole. Patient will completely disperse tablets by stirring in water, orange juice, or apple juice until a fine suspension is obtained. Avoid dispersion in milk or carbonated beverages due to dissolution issues Patient will disperse doses of <1 g in 3.5 ounces of liquid and doses of ?1 g in 7.0 ounces of liquid.   After swallowing the suspension, patient will resuspend any residue in a small volume of liquid and swallow.  Patient knows to separate Exjade administration for aluminum antacids.  Exjade start date: 06/25/2019  Adverse effects include but are not limited to: nausea, vomiting, diarrhea, rash, hearing or sight disturbances, renal failure, decreased blood counts, and hypersensitivity reactions.    Reviewed with patient importance of keeping a medication schedule and plan for any missed doses.  Medication reconciliation performed and medication/allergy list updated.  Insurance authorization  for Exjade was not required at this time. Test claim at the pharmacy revealed copayment $10.00 for 1st fill of Exjade. Patient will pick this up from the [facility] on 06/24/2019.  Patient informed the pharmacy will reach out 5-7 days prior to needing next fill of Exjade to coordinate continued medication acquisition to prevent break in therapy.  All questions answered.  Ms. Brandy voiced understanding and appreciation.   Patient knows to call the office with questions or concerns.  Johny Drilling, PharmD, BCPS, BCOP  06/23/2019  12:25 PM Oral Oncology Clinic [phone]

## 2019-06-23 NOTE — Telephone Encounter (Signed)
Oral Oncology Pharmacist Encounter  Received new prescription for Exjade (defeasirox) for the treatment of iron overload in a patient who is transfusion dependent, planned duration until adequate response or unacceptable toxicity.  Exjade is being dose at ~ 20 mg/kg once daily, rounded down to product size, 1000 mg once daily  Labs from Nov 2020 assessed MD aware of severe thrombocytopenia and Exjade contraindication with pltc< 50k, pt's pltc=26k on 06/20/19 Pt also severely neutropenic, ANC=0.1, MD ok to proceed SCr=1.27, est CrCl ~ 50 mL/min  Current medication list in Epic reviewed, DDI with Exjade identified: Category C interaction: deferasirox is a CYP3A4 inducer leading to possible increased metabolism and decreased systemic exposure to patient's cyclosporin. Patient is getting cyclosporin levels checked at [facility] on a weekly basis. Dose can be optimized during interaction. MD is aware.  Prescription has been e-scribed to the [facility] for benefits analysis and approval.  Oral Oncology Clinic will continue to follow for insurance authorization, copayment issues, initial counseling and start date.  Johny Drilling, PharmD, BCPS, BCOP  06/23/2019 10:14 AM Oral Oncology Clinic [phone]

## 2019-06-23 NOTE — Progress Notes (Addendum)
[place] OFFICE PROGRESS NOTE   Diagnosis: CLL, aplastic anemia  INTERVAL HISTORY:   Meagan Walsh returns as scheduled.  She was transfused with a unit of blood and platelets 06/20/2019.  She is no longer having gum bleeding.  No other bleeding.  She feels better since the blood transfusion.  No fever, cough, shortness of breath.  No diarrhea.  Nausea is better.  She has had right upper arm pain for the past several weeks.  She is not aware of any injury.  Objective:  Vital signs in last 24 hours:  Blood pressure 119/72, pulse 83, temperature 98.3 F (36.8 C), temperature source Temporal, resp. rate 17, height '5\' 6"'  (1.676 m), weight 137 lb 6.4 oz (62.3 kg), SpO2 100 %.    HEENT: Gums are erythematous, hypertrophied.  No bleeding noted. GI: Abdomen soft and nontender.  No hepatosplenomegaly. Vascular: No leg edema.  Calves soft and nontender. Musculoskeletal: Nontender over the right upper arm/shoulder.  Mild decreased range of motion with shoulder flexion. Neuro: Alert and oriented. Left upper extremity PICC without erythema.  Lab Results:  Lab Results  Component Value Date   WBC 2.0 (L) 06/23/2019   HGB 9.6 (L) 06/23/2019   HCT 27.7 (L) 06/23/2019   MCV 85.2 06/23/2019   PLT 14 (L) 06/23/2019   NEUTROABS 0.2 (LL) 06/23/2019    Imaging:  No results found.  Medications: I have reviewed the patient's current medications.  Assessment/Plan: 1. 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 Mild splenomegaly.  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 p53 (17p13) deletion, no evidence of 11q22deletion  Bone marrow biopsy 02/26/2018-hypercellular marrow with extensive involvement by CLL, lymphocytes represent85% of all cells  Ibrutinib initiated 04/03/2018  Ibrutinib placed on hold 04/11/2018 due to onset of arthralgias  Ibrutinib resumed 04/16/2018, discontinued 04/25/2018 secondary to severe arthralgias/arthritis  Ibrutinib resumed at a dose of 140 mg daily 05/03/2018  Ibrutinib dose adjusted to 140 mg alternating with 212m 06/25/2018  Ibrutinib discontinued 07/03/2018 secondary to severe arthralgias  Acalabrutinib 08/16/2018, discontinued 11/15/2018 secondary to persistent severe transfusion dependent anemia and neutropenia/thrombocytopenia, last dose 11/14/2018  Bone marrow biopsy 11/21/2018-decreased cellularity, involvement by CLL, decreased erythroid and granulocytic precursors, decreased megakaryocytes  Cycle 1 rituximab 12/06/2018  Bone marrow biopsy 12/24/2018 at [facility]-hypocellular bone marrow (10%) involved by CLL, representing 50% of marrow cellularity; markedly decreased trilineage hematopoiesis including essentially absent erythropoiesis  Bone marrow biopsy 03/06/2027 [facility]-hypocellular bone marrow (20%) with marked reduction of maturing hematopoietic elements; numerous lymphoid aggregates consistent with involvement by CLL, representing approximately 50% of marrow cellularity by flow cytometry  [facility] followed by prednisone taper  2.Hypothyroidism 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 Augmentin prescribed. 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 Pancytopenia secondary to CLL and a hypocellular bone marrow  G-CSF and Nplate started 7/54/3606, G-CSF changed to daily beginning 12/17/2018; G-CSF discontinued 12/31/2018, last Nplate 01/15/2019  Began prednisone 60 mg daily 01/11/19, tolerating moderately well except jitteriness, irritability, and difficulty sleeping. Tapered to 40 mg daily x1 week starting 01/24/19, reduce by 10 mg each week until discontinued. Prednisone discontinued 02/20/2019.  Promacta started 01/20/2019, dose increased to 100 mg daily 02/04/2019; dose increased to 150 mg daily 02/21/2019  IVIG daily for 2 days beginning 02/25/2019   9. 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  MRI liver 06/12/2019-iron overload in liver and spleen.  No lymphadenopathy or splenomegaly.  Disposition: Meagan Walsh appears unchanged.  She continues to have severe pancytopenia.  She will not require transfusion support today.  We will continue to monitor counts closely on a Monday Wednesday Friday schedule.  She understands to contact the office with fever, chills, other signs of infection, bleeding.  She will return for a follow-up visit in 1 week.  She will contact the office in the interim as outlined above or with any other problems.  Patient seen with Dr. Benay Spice.  Ned Card ANP/GNP-BC   06/23/2019  11:46 AM  This was a  shared visit with Ned Card.  There is no indication for transfusion therapy today.  She will continue cyclosporine.  We we will consult with the [facility] regarding cyclosporine dosing.  She will return for close follow-up with a CBC.  We will schedule office visit for 1 week.  [name], MD

## 2019-06-24 ENCOUNTER — Telehealth: Payer: Self-pay | Admitting: Oncology

## 2019-06-24 MED FILL — DEFERASIROX 500 MG TBSO: 500 | 30 days supply | Qty: 60 | Fill #0

## 2019-06-24 NOTE — Telephone Encounter (Signed)
Scheduled per los. Called and spoke with patient. Confirmed appt on 12/2. Advised would call once 12/4 appt is scheduled

## 2019-06-25 ENCOUNTER — Inpatient Hospital Stay: Payer: BC Managed Care – PPO | Attending: Oncology

## 2019-06-25 ENCOUNTER — Other Ambulatory Visit: Payer: Self-pay | Admitting: *Deleted

## 2019-06-25 ENCOUNTER — Other Ambulatory Visit: Payer: Self-pay

## 2019-06-25 ENCOUNTER — Telehealth: Payer: Self-pay | Admitting: *Deleted

## 2019-06-25 ENCOUNTER — Inpatient Hospital Stay: Payer: BC Managed Care – PPO

## 2019-06-25 DIAGNOSIS — M25519 Pain in unspecified shoulder: Secondary | ICD-10-CM | POA: Diagnosis not present

## 2019-06-25 DIAGNOSIS — C9110 Chronic lymphocytic leukemia of B-cell type not having achieved remission: Secondary | ICD-10-CM | POA: Insufficient documentation

## 2019-06-25 DIAGNOSIS — N939 Abnormal uterine and vaginal bleeding, unspecified: Secondary | ICD-10-CM | POA: Diagnosis not present

## 2019-06-25 DIAGNOSIS — Z79899 Other long term (current) drug therapy: Secondary | ICD-10-CM | POA: Insufficient documentation

## 2019-06-25 DIAGNOSIS — R5381 Other malaise: Secondary | ICD-10-CM | POA: Insufficient documentation

## 2019-06-25 DIAGNOSIS — D61818 Other pancytopenia: Secondary | ICD-10-CM | POA: Diagnosis not present

## 2019-06-25 DIAGNOSIS — D696 Thrombocytopenia, unspecified: Secondary | ICD-10-CM

## 2019-06-25 DIAGNOSIS — R112 Nausea with vomiting, unspecified: Secondary | ICD-10-CM | POA: Diagnosis not present

## 2019-06-25 DIAGNOSIS — D801 Nonfamilial hypogammaglobulinemia: Secondary | ICD-10-CM | POA: Diagnosis not present

## 2019-06-25 DIAGNOSIS — Z95828 Presence of other vascular implants and grafts: Secondary | ICD-10-CM

## 2019-06-25 DIAGNOSIS — E039 Hypothyroidism, unspecified: Secondary | ICD-10-CM | POA: Diagnosis not present

## 2019-06-25 LAB — TYPE AND SCREEN
ABO/RH(D): O POS
Antibody Screen: NEGATIVE

## 2019-06-25 LAB — CBC WITH DIFFERENTIAL (CANCER CENTER ONLY)
Abs Immature Granulocytes: 0 10*3/uL (ref 0.00–0.07)
Basophils Absolute: 0 10*3/uL (ref 0.0–0.1)
Basophils Relative: 0 %
Eosinophils Absolute: 0 10*3/uL (ref 0.0–0.5)
Eosinophils Relative: 3 %
HCT: 27.7 % — ABNORMAL LOW (ref 36.0–46.0)
Hemoglobin: 9.3 g/dL — ABNORMAL LOW (ref 12.0–15.0)
Immature Granulocytes: 0 %
Lymphocytes Relative: 76 %
Lymphs Abs: 1.2 10*3/uL (ref 0.7–4.0)
MCH: 29.2 pg (ref 26.0–34.0)
MCHC: 33.6 g/dL (ref 30.0–36.0)
MCV: 86.8 fL (ref 80.0–100.0)
Monocytes Absolute: 0.1 10*3/uL (ref 0.1–1.0)
Monocytes Relative: 9 %
Neutro Abs: 0.2 10*3/uL — CL (ref 1.7–7.7)
Neutrophils Relative %: 12 %
Platelet Count: 10 10*3/uL — ABNORMAL LOW (ref 150–400)
RBC: 3.19 MIL/uL — ABNORMAL LOW (ref 3.87–5.11)
RDW: 13.3 % (ref 11.5–15.5)
WBC Count: 1.5 10*3/uL — ABNORMAL LOW (ref 4.0–10.5)
nRBC: 0 % (ref 0.0–0.2)

## 2019-06-25 LAB — CMP (CANCER CENTER ONLY)
ALT: 16 U/L (ref 0–44)
AST: 18 U/L (ref 15–41)
Albumin: 3.5 g/dL (ref 3.5–5.0)
Alkaline Phosphatase: 119 U/L (ref 38–126)
Anion gap: 11 (ref 5–15)
BUN: 20 mg/dL (ref 6–20)
CO2: 23 mmol/L (ref 22–32)
Calcium: 9.9 mg/dL (ref 8.9–10.3)
Chloride: 105 mmol/L (ref 98–111)
Creatinine: 1.2 mg/dL — ABNORMAL HIGH (ref 0.44–1.00)
GFR, Est AFR Am: 57 mL/min — ABNORMAL LOW (ref >60)
GFR, Estimated: 49 mL/min — ABNORMAL LOW (ref >60)
Glucose, Bld: 167 mg/dL — ABNORMAL HIGH (ref 70–99)
Potassium: 4.3 mmol/L (ref 3.5–5.1)
Sodium: 139 mmol/L (ref 135–145)
Total Bilirubin: 0.9 mg/dL (ref 0.3–1.2)
Total Protein: 6 g/dL — ABNORMAL LOW (ref 6.5–8.1)

## 2019-06-25 MED ORDER — SODIUM CHLORIDE 0.9% FLUSH
10.0000 mL | INTRAVENOUS | Status: DC | PRN
  Administered 2019-06-25: 10 mL
  Filled 2019-06-25: qty 10

## 2019-06-25 MED ORDER — SODIUM CHLORIDE 0.9% IV SOLUTION
250.0000 mL | Freq: Once | INTRAVENOUS | Status: DC
  Filled 2019-06-25: qty 250

## 2019-06-25 MED ORDER — SODIUM CHLORIDE 0.9% FLUSH
10.0000 mL | INTRAVENOUS | Status: AC | PRN
  Administered 2019-06-25: 10 mL
  Filled 2019-06-25: qty 10

## 2019-06-25 MED ORDER — HEPARIN SOD (PORK) LOCK FLUSH 100 UNIT/ML IV SOLN
250.0000 [IU] | INTRAVENOUS | Status: AC | PRN
  Administered 2019-06-25: 250 [IU]
  Filled 2019-06-25: qty 5

## 2019-06-25 NOTE — Patient Instructions (Signed)
PICC Home Care Guide ° °A peripherally inserted central catheter (PICC) is a form of IV access that allows medicines and IV fluids to be quickly distributed throughout the body. The PICC is a long, thin, flexible tube (catheter) that is inserted into a vein in the upper arm. The catheter ends in a large vein in the chest (superior vena cava, or SVC). After the PICC is inserted, a chest X-ray may be done to make sure that it is in the correct place. °A PICC may be placed for different reasons, such as: °· To give medicines and liquid nutrition. °· To give IV fluids and blood products. °· If there is trouble placing a peripheral intravenous (PIV) catheter. °If taken care of properly, a PICC can remain in place for several months. Having a PICC can also allow a person to go home from the hospital sooner. Medicine and PICC care can be managed at home by a family member, caregiver, or home health care team. °What are the risks? °Generally, having a PICC is safe. However, problems may occur, including: °· A blood clot (thrombus) forming in or at the tip of the PICC. °· A blood clot forming in a vein (deep vein thrombosis) or traveling to the lung (pulmonary embolism). °· Inflammation of the vein (phlebitis) in which the PICC is placed. °· Infection. Central line associated blood stream infection (CLABSI) is a serious infection that often requires hospitalization. °· PICC movement (malposition). The PICC tip may move from its original position due to excessive physical activity, forceful coughing, sneezing, or vomiting. °· A break or cut in the PICC. It is important not to use scissors near the PICC. °· Nerve or tendon irritation or injury during PICC insertion. °How to take care of your PICC °Preventing problems °· You and any caregivers should wash your hands often with soap. Wash hands: °? Before touching the PICC line or the infusion device. °? Before changing a bandage (dressing). °· Flush the PICC as told by your  health care provider. Let your health care provider know right away if the PICC is hard to flush or does not flush. Do not use force to flush the PICC. °· Do not use a syringe that is less than 10 mL to flush the PICC. °· Avoid blood pressure checks on the arm in which the PICC is placed. °· Never pull or tug on the PICC. °· Do not take the PICC out yourself. Only a trained clinical professional should remove the PICC. °· Use clean and sterile supplies only. Keep the supplies in a dry place. Do not reuse needles, syringes, or any other supplies. Doing that can lead to infection. °· Keep pets and children away from your PICC line. °· Check the PICC insertion site every day for signs of infection. Check for: °? Leakage. °? Redness, swelling, or pain. °? Fluid or blood. °? Warmth. °? Pus or a bad smell. °PICC dressing care °· Keep your PICC bandage (dressing) clean and dry to prevent infection. °· Do not take baths, swim, or use a hot tub until your health care provider approves. Ask your health care provider if you can take showers. You may only be allowed to take sponge baths for bathing. When you are allowed to shower: °? Ask your health care provider to teach you how to wrap the PICC line. °? Cover the PICC line with clear plastic wrap and tape to keep it dry while showering. °· Follow instructions from your health care provider   about how to take care of your insertion site and dressing. Make sure you: °? Wash your hands with soap and water before you change your bandage (dressing). If soap and water are not available, use hand sanitizer. °? Change your dressing as told by your health care provider. °? Leave stitches (sutures), skin glue, or adhesive strips in place. These skin closures may need to stay in place for 2 weeks or longer. If adhesive strip edges start to loosen and curl up, you may trim the loose edges. Do not remove adhesive strips completely unless your health care provider tells you to do  that. °· Change your PICC dressing if it becomes loose or wet. °General instructions ° °· Carry your PICC identification card or wear a medical alert bracelet at all times. °· Keep the tube clamped at all times, unless it is being used. °· Carry a smooth-edge clamp with you at all times to place on the tube if it breaks. °· Do not use scissors or sharp objects near the tube. °· You may bend your arm and move it freely. If your PICC is near or at the bend of your elbow, avoid activity with repeated motion at the elbow. °· Avoid lifting heavy objects as told by your health care provider. °· Keep all follow-up visits as told by your health care provider. This is important. °Disposal of supplies °· Throw away any syringes in a disposal container that is meant for sharp items (sharps container). You can buy a sharps container from a pharmacy, or you can make one by using an empty hard plastic bottle with a cover. °· Place any used dressings or infusion bags into a plastic bag. Throw that bag in the trash. °Contact a health care provider if: °· You have pain in your arm, ear, face, or teeth. °· You have a fever or chills. °· You have redness, swelling, or pain around the insertion site. °· You have fluid or blood coming from the insertion site. °· Your insertion site feels warm to the touch. °· You have pus or a bad smell coming from the insertion site. °· Your skin feels hard and raised around the insertion site. °Get help right away if: °· Your PICC is accidentally pulled all the way out. If this happens, cover the insertion site with a bandage or gauze dressing. Do not throw the PICC away. Your health care provider will need to check it. °· Your PICC was tugged or pulled and has partially come out. Do not  push the PICC back in. °· You cannot flush the PICC, it is hard to flush, or the PICC leaks around the insertion site when it is flushed. °· You hear a "flushing" sound when the PICC is flushed. °· You feel your  heart racing or skipping beats. °· There is a hole or tear in the PICC. °· You have swelling in the arm in which the PICC was inserted. °· You have a red streak going up your arm from where the PICC was inserted. °Summary °· A peripherally inserted central catheter (PICC) is a long, thin, flexible tube (catheter) that is inserted into a vein in the upper arm. °· The PICC is inserted using a sterile technique by a specially trained nurse or physician. Only a trained clinical professional should remove it. °· Keep your PICC identification card with you at all times. °· Avoid blood pressure checks on the arm in which the PICC is placed. °· If cared for   properly, a PICC can remain in place for several months. Having a PICC can also allow a person to go home from the hospital sooner. °This information is not intended to replace advice given to you by your health care provider. Make sure you discuss any questions you have with your health care provider. °Document Released: 01/14/2003 Document Revised: 06/22/2017 Document Reviewed: 08/12/2016 °Elsevier Patient Education © 2020 Elsevier Inc. ° °

## 2019-06-25 NOTE — Telephone Encounter (Signed)
Received call from lab. Pt's ANC is 0.2   Merceda Elks, RN with Dr. Benay Spice notified

## 2019-06-25 NOTE — Patient Instructions (Signed)

## 2019-06-25 NOTE — Progress Notes (Signed)
Call to [place] in blood bank to alert to prepare platelet order in system for today.They will obtain random platelets for today's infusion. Dr. Benay Spice is aware of ANC--no changes, stable.

## 2019-06-26 ENCOUNTER — Other Ambulatory Visit: Payer: Self-pay | Admitting: Nurse Practitioner

## 2019-06-26 DIAGNOSIS — C9110 Chronic lymphocytic leukemia of B-cell type not having achieved remission: Secondary | ICD-10-CM

## 2019-06-26 LAB — BPAM PLATELET PHERESIS
Blood Product Expiration Date: 202012032359
ISSUE DATE / TIME: 202012021354
Unit Type and Rh: 8400

## 2019-06-26 LAB — PREPARE PLATELET PHERESIS: Unit division: 0

## 2019-06-27 ENCOUNTER — Other Ambulatory Visit: Payer: BC Managed Care – PPO

## 2019-06-27 ENCOUNTER — Other Ambulatory Visit: Payer: Self-pay | Admitting: *Deleted

## 2019-06-27 ENCOUNTER — Inpatient Hospital Stay: Payer: BC Managed Care – PPO

## 2019-06-27 ENCOUNTER — Other Ambulatory Visit: Payer: Self-pay

## 2019-06-27 ENCOUNTER — Telehealth: Payer: Self-pay | Admitting: *Deleted

## 2019-06-27 DIAGNOSIS — N939 Abnormal uterine and vaginal bleeding, unspecified: Secondary | ICD-10-CM | POA: Diagnosis not present

## 2019-06-27 DIAGNOSIS — R112 Nausea with vomiting, unspecified: Secondary | ICD-10-CM | POA: Diagnosis not present

## 2019-06-27 DIAGNOSIS — R5381 Other malaise: Secondary | ICD-10-CM | POA: Diagnosis not present

## 2019-06-27 DIAGNOSIS — D801 Nonfamilial hypogammaglobulinemia: Secondary | ICD-10-CM | POA: Diagnosis not present

## 2019-06-27 DIAGNOSIS — Z79899 Other long term (current) drug therapy: Secondary | ICD-10-CM | POA: Diagnosis not present

## 2019-06-27 DIAGNOSIS — D61818 Other pancytopenia: Secondary | ICD-10-CM | POA: Diagnosis not present

## 2019-06-27 DIAGNOSIS — D696 Thrombocytopenia, unspecified: Secondary | ICD-10-CM

## 2019-06-27 DIAGNOSIS — Z95828 Presence of other vascular implants and grafts: Secondary | ICD-10-CM

## 2019-06-27 DIAGNOSIS — C9110 Chronic lymphocytic leukemia of B-cell type not having achieved remission: Secondary | ICD-10-CM | POA: Diagnosis not present

## 2019-06-27 DIAGNOSIS — M25519 Pain in unspecified shoulder: Secondary | ICD-10-CM | POA: Diagnosis not present

## 2019-06-27 DIAGNOSIS — E039 Hypothyroidism, unspecified: Secondary | ICD-10-CM | POA: Diagnosis not present

## 2019-06-27 LAB — CBC WITH DIFFERENTIAL (CANCER CENTER ONLY)
Abs Immature Granulocytes: 0 10*3/uL (ref 0.00–0.07)
Basophils Absolute: 0 10*3/uL (ref 0.0–0.1)
Basophils Relative: 0 %
Eosinophils Absolute: 0 10*3/uL (ref 0.0–0.5)
Eosinophils Relative: 2 %
HCT: 27.5 % — ABNORMAL LOW (ref 36.0–46.0)
Hemoglobin: 9.4 g/dL — ABNORMAL LOW (ref 12.0–15.0)
Immature Granulocytes: 0 %
Lymphocytes Relative: 81 %
Lymphs Abs: 1.4 10*3/uL (ref 0.7–4.0)
MCH: 29.5 pg (ref 26.0–34.0)
MCHC: 34.2 g/dL (ref 30.0–36.0)
MCV: 86.2 fL (ref 80.0–100.0)
Monocytes Absolute: 0.1 10*3/uL (ref 0.1–1.0)
Monocytes Relative: 8 %
Neutro Abs: 0.2 10*3/uL — CL (ref 1.7–7.7)
Neutrophils Relative %: 9 %
Platelet Count: 16 10*3/uL — ABNORMAL LOW (ref 150–400)
RBC: 3.19 MIL/uL — ABNORMAL LOW (ref 3.87–5.11)
RDW: 13.2 % (ref 11.5–15.5)
WBC Count: 1.7 10*3/uL — ABNORMAL LOW (ref 4.0–10.5)
nRBC: 0 % (ref 0.0–0.2)

## 2019-06-27 LAB — TYPE AND SCREEN
ABO/RH(D): O POS
Antibody Screen: NEGATIVE

## 2019-06-27 LAB — SAMPLE TO BLOOD BANK

## 2019-06-27 MED ORDER — HEPARIN SOD (PORK) LOCK FLUSH 100 UNIT/ML IV SOLN
500.0000 [IU] | Freq: Once | INTRAVENOUS | Status: AC | PRN
  Administered 2019-06-27: 500 [IU]
  Filled 2019-06-27: qty 5

## 2019-06-27 MED ORDER — SODIUM CHLORIDE 0.9% FLUSH
10.0000 mL | INTRAVENOUS | Status: DC | PRN
  Administered 2019-06-27: 10 mL
  Filled 2019-06-27: qty 10

## 2019-06-27 NOTE — Telephone Encounter (Signed)
Per Dr. Benay Spice: Labs today stable. No blood needed. Will give platelets tomorrow if she has any bleeding. Meagan Walsh notified of results. She is having small amount of intermittent bleeding of gums. Will transfuse platelets only tomorrow. Blood bank notified.

## 2019-06-27 NOTE — Patient Instructions (Signed)
PICC Home Care Guide ° °A peripherally inserted central catheter (PICC) is a form of IV access that allows medicines and IV fluids to be quickly distributed throughout the body. The PICC is a long, thin, flexible tube (catheter) that is inserted into a vein in the upper arm. The catheter ends in a large vein in the chest (superior vena cava, or SVC). After the PICC is inserted, a chest X-ray may be done to make sure that it is in the correct place. °A PICC may be placed for different reasons, such as: °· To give medicines and liquid nutrition. °· To give IV fluids and blood products. °· If there is trouble placing a peripheral intravenous (PIV) catheter. °If taken care of properly, a PICC can remain in place for several months. Having a PICC can also allow a person to go home from the hospital sooner. Medicine and PICC care can be managed at home by a family member, caregiver, or home health care team. °What are the risks? °Generally, having a PICC is safe. However, problems may occur, including: °· A blood clot (thrombus) forming in or at the tip of the PICC. °· A blood clot forming in a vein (deep vein thrombosis) or traveling to the lung (pulmonary embolism). °· Inflammation of the vein (phlebitis) in which the PICC is placed. °· Infection. Central line associated blood stream infection (CLABSI) is a serious infection that often requires hospitalization. °· PICC movement (malposition). The PICC tip may move from its original position due to excessive physical activity, forceful coughing, sneezing, or vomiting. °· A break or cut in the PICC. It is important not to use scissors near the PICC. °· Nerve or tendon irritation or injury during PICC insertion. °How to take care of your PICC °Preventing problems °· You and any caregivers should wash your hands often with soap. Wash hands: °? Before touching the PICC line or the infusion device. °? Before changing a bandage (dressing). °· Flush the PICC as told by your  health care provider. Let your health care provider know right away if the PICC is hard to flush or does not flush. Do not use force to flush the PICC. °· Do not use a syringe that is less than 10 mL to flush the PICC. °· Avoid blood pressure checks on the arm in which the PICC is placed. °· Never pull or tug on the PICC. °· Do not take the PICC out yourself. Only a trained clinical professional should remove the PICC. °· Use clean and sterile supplies only. Keep the supplies in a dry place. Do not reuse needles, syringes, or any other supplies. Doing that can lead to infection. °· Keep pets and children away from your PICC line. °· Check the PICC insertion site every day for signs of infection. Check for: °? Leakage. °? Redness, swelling, or pain. °? Fluid or blood. °? Warmth. °? Pus or a bad smell. °PICC dressing care °· Keep your PICC bandage (dressing) clean and dry to prevent infection. °· Do not take baths, swim, or use a hot tub until your health care provider approves. Ask your health care provider if you can take showers. You may only be allowed to take sponge baths for bathing. When you are allowed to shower: °? Ask your health care provider to teach you how to wrap the PICC line. °? Cover the PICC line with clear plastic wrap and tape to keep it dry while showering. °· Follow instructions from your health care provider   about how to take care of your insertion site and dressing. Make sure you: °? Wash your hands with soap and water before you change your bandage (dressing). If soap and water are not available, use hand sanitizer. °? Change your dressing as told by your health care provider. °? Leave stitches (sutures), skin glue, or adhesive strips in place. These skin closures may need to stay in place for 2 weeks or longer. If adhesive strip edges start to loosen and curl up, you may trim the loose edges. Do not remove adhesive strips completely unless your health care provider tells you to do  that. °· Change your PICC dressing if it becomes loose or wet. °General instructions ° °· Carry your PICC identification card or wear a medical alert bracelet at all times. °· Keep the tube clamped at all times, unless it is being used. °· Carry a smooth-edge clamp with you at all times to place on the tube if it breaks. °· Do not use scissors or sharp objects near the tube. °· You may bend your arm and move it freely. If your PICC is near or at the bend of your elbow, avoid activity with repeated motion at the elbow. °· Avoid lifting heavy objects as told by your health care provider. °· Keep all follow-up visits as told by your health care provider. This is important. °Disposal of supplies °· Throw away any syringes in a disposal container that is meant for sharp items (sharps container). You can buy a sharps container from a pharmacy, or you can make one by using an empty hard plastic bottle with a cover. °· Place any used dressings or infusion bags into a plastic bag. Throw that bag in the trash. °Contact a health care provider if: °· You have pain in your arm, ear, face, or teeth. °· You have a fever or chills. °· You have redness, swelling, or pain around the insertion site. °· You have fluid or blood coming from the insertion site. °· Your insertion site feels warm to the touch. °· You have pus or a bad smell coming from the insertion site. °· Your skin feels hard and raised around the insertion site. °Get help right away if: °· Your PICC is accidentally pulled all the way out. If this happens, cover the insertion site with a bandage or gauze dressing. Do not throw the PICC away. Your health care provider will need to check it. °· Your PICC was tugged or pulled and has partially come out. Do not  push the PICC back in. °· You cannot flush the PICC, it is hard to flush, or the PICC leaks around the insertion site when it is flushed. °· You hear a "flushing" sound when the PICC is flushed. °· You feel your  heart racing or skipping beats. °· There is a hole or tear in the PICC. °· You have swelling in the arm in which the PICC was inserted. °· You have a red streak going up your arm from where the PICC was inserted. °Summary °· A peripherally inserted central catheter (PICC) is a long, thin, flexible tube (catheter) that is inserted into a vein in the upper arm. °· The PICC is inserted using a sterile technique by a specially trained nurse or physician. Only a trained clinical professional should remove it. °· Keep your PICC identification card with you at all times. °· Avoid blood pressure checks on the arm in which the PICC is placed. °· If cared for   properly, a PICC can remain in place for several months. Having a PICC can also allow a person to go home from the hospital sooner. °This information is not intended to replace advice given to you by your health care provider. Make sure you discuss any questions you have with your health care provider. °Document Released: 01/14/2003 Document Revised: 06/22/2017 Document Reviewed: 08/12/2016 °Elsevier Patient Education © 2020 Elsevier Inc. ° °

## 2019-06-28 ENCOUNTER — Inpatient Hospital Stay: Payer: BC Managed Care – PPO

## 2019-06-28 DIAGNOSIS — R5381 Other malaise: Secondary | ICD-10-CM | POA: Diagnosis not present

## 2019-06-28 DIAGNOSIS — E039 Hypothyroidism, unspecified: Secondary | ICD-10-CM | POA: Diagnosis not present

## 2019-06-28 DIAGNOSIS — N939 Abnormal uterine and vaginal bleeding, unspecified: Secondary | ICD-10-CM | POA: Diagnosis not present

## 2019-06-28 DIAGNOSIS — C9110 Chronic lymphocytic leukemia of B-cell type not having achieved remission: Secondary | ICD-10-CM | POA: Diagnosis not present

## 2019-06-28 DIAGNOSIS — D61818 Other pancytopenia: Secondary | ICD-10-CM | POA: Diagnosis not present

## 2019-06-28 DIAGNOSIS — M25519 Pain in unspecified shoulder: Secondary | ICD-10-CM | POA: Diagnosis not present

## 2019-06-28 DIAGNOSIS — Z79899 Other long term (current) drug therapy: Secondary | ICD-10-CM | POA: Diagnosis not present

## 2019-06-28 DIAGNOSIS — R112 Nausea with vomiting, unspecified: Secondary | ICD-10-CM | POA: Diagnosis not present

## 2019-06-28 DIAGNOSIS — D801 Nonfamilial hypogammaglobulinemia: Secondary | ICD-10-CM | POA: Diagnosis not present

## 2019-06-28 DIAGNOSIS — D696 Thrombocytopenia, unspecified: Secondary | ICD-10-CM

## 2019-06-28 MED ORDER — HEPARIN SOD (PORK) LOCK FLUSH 100 UNIT/ML IV SOLN
250.0000 [IU] | INTRAVENOUS | Status: AC | PRN
  Administered 2019-06-28: 250 [IU]
  Filled 2019-06-28: qty 5

## 2019-06-28 MED ORDER — SODIUM CHLORIDE 0.9% IV SOLUTION
250.0000 mL | Freq: Once | INTRAVENOUS | Status: AC
  Administered 2019-06-28: 250 mL via INTRAVENOUS
  Filled 2019-06-28: qty 250

## 2019-06-28 MED ORDER — SODIUM CHLORIDE 0.9% FLUSH
10.0000 mL | INTRAVENOUS | Status: AC | PRN
  Administered 2019-06-28: 10 mL
  Filled 2019-06-28: qty 10

## 2019-06-28 NOTE — Patient Instructions (Signed)

## 2019-06-30 ENCOUNTER — Inpatient Hospital Stay (HOSPITAL_BASED_OUTPATIENT_CLINIC_OR_DEPARTMENT_OTHER): Payer: BC Managed Care – PPO | Admitting: Nurse Practitioner

## 2019-06-30 ENCOUNTER — Inpatient Hospital Stay: Payer: BC Managed Care – PPO

## 2019-06-30 ENCOUNTER — Telehealth: Payer: Self-pay

## 2019-06-30 ENCOUNTER — Other Ambulatory Visit: Payer: Self-pay

## 2019-06-30 ENCOUNTER — Encounter: Payer: Self-pay | Admitting: Nurse Practitioner

## 2019-06-30 VITALS — BP 100/67 | HR 90 | Temp 98.0°F | Resp 18 | Ht 66.0 in | Wt 134.1 lb

## 2019-06-30 DIAGNOSIS — E039 Hypothyroidism, unspecified: Secondary | ICD-10-CM | POA: Diagnosis not present

## 2019-06-30 DIAGNOSIS — D801 Nonfamilial hypogammaglobulinemia: Secondary | ICD-10-CM | POA: Diagnosis not present

## 2019-06-30 DIAGNOSIS — Z95828 Presence of other vascular implants and grafts: Secondary | ICD-10-CM

## 2019-06-30 DIAGNOSIS — D649 Anemia, unspecified: Secondary | ICD-10-CM

## 2019-06-30 DIAGNOSIS — D696 Thrombocytopenia, unspecified: Secondary | ICD-10-CM

## 2019-06-30 DIAGNOSIS — M25519 Pain in unspecified shoulder: Secondary | ICD-10-CM | POA: Diagnosis not present

## 2019-06-30 DIAGNOSIS — C9110 Chronic lymphocytic leukemia of B-cell type not having achieved remission: Secondary | ICD-10-CM

## 2019-06-30 DIAGNOSIS — R112 Nausea with vomiting, unspecified: Secondary | ICD-10-CM | POA: Diagnosis not present

## 2019-06-30 DIAGNOSIS — N939 Abnormal uterine and vaginal bleeding, unspecified: Secondary | ICD-10-CM | POA: Diagnosis not present

## 2019-06-30 DIAGNOSIS — R5381 Other malaise: Secondary | ICD-10-CM | POA: Diagnosis not present

## 2019-06-30 DIAGNOSIS — D61818 Other pancytopenia: Secondary | ICD-10-CM | POA: Diagnosis not present

## 2019-06-30 DIAGNOSIS — Z79899 Other long term (current) drug therapy: Secondary | ICD-10-CM | POA: Diagnosis not present

## 2019-06-30 LAB — BPAM PLATELET PHERESIS
Blood Product Expiration Date: 202012062359
ISSUE DATE / TIME: 202012051019
Unit Type and Rh: 7300

## 2019-06-30 LAB — CBC WITH DIFFERENTIAL (CANCER CENTER ONLY)
Abs Immature Granulocytes: 0.02 10*3/uL (ref 0.00–0.07)
Basophils Absolute: 0 10*3/uL (ref 0.0–0.1)
Basophils Relative: 0 %
Eosinophils Absolute: 0 10*3/uL (ref 0.0–0.5)
Eosinophils Relative: 1 %
HCT: 22.4 % — ABNORMAL LOW (ref 36.0–46.0)
Hemoglobin: 7.7 g/dL — ABNORMAL LOW (ref 12.0–15.0)
Immature Granulocytes: 1 %
Lymphocytes Relative: 82 %
Lymphs Abs: 1.7 10*3/uL (ref 0.7–4.0)
MCH: 29.7 pg (ref 26.0–34.0)
MCHC: 34.4 g/dL (ref 30.0–36.0)
MCV: 86.5 fL (ref 80.0–100.0)
Monocytes Absolute: 0.2 10*3/uL (ref 0.1–1.0)
Monocytes Relative: 9 %
Neutro Abs: 0.2 10*3/uL — CL (ref 1.7–7.7)
Neutrophils Relative %: 7 %
Platelet Count: 25 10*3/uL — ABNORMAL LOW (ref 150–400)
RBC: 2.59 MIL/uL — ABNORMAL LOW (ref 3.87–5.11)
RDW: 13.1 % (ref 11.5–15.5)
WBC Count: 2.2 10*3/uL — ABNORMAL LOW (ref 4.0–10.5)
nRBC: 0 % (ref 0.0–0.2)

## 2019-06-30 LAB — PREPARE PLATELET PHERESIS: Unit division: 0

## 2019-06-30 LAB — PREPARE RBC (CROSSMATCH)

## 2019-06-30 LAB — CYCLOSPORINE: Cyclosporine, LabCorp: 209 ng/mL (ref 100–400)

## 2019-06-30 LAB — SAMPLE TO BLOOD BANK

## 2019-06-30 MED ORDER — SODIUM CHLORIDE 0.9% FLUSH
10.0000 mL | INTRAVENOUS | Status: DC | PRN
  Administered 2019-06-30: 10 mL
  Filled 2019-06-30: qty 10

## 2019-06-30 MED ORDER — HEPARIN SOD (PORK) LOCK FLUSH 100 UNIT/ML IV SOLN
250.0000 [IU] | INTRAVENOUS | Status: AC | PRN
  Administered 2019-06-30: 250 [IU]
  Filled 2019-06-30: qty 5

## 2019-06-30 MED ORDER — SODIUM CHLORIDE 0.9% FLUSH
3.0000 mL | INTRAVENOUS | Status: DC | PRN
  Filled 2019-06-30: qty 10

## 2019-06-30 MED ORDER — HEPARIN SOD (PORK) LOCK FLUSH 100 UNIT/ML IV SOLN
500.0000 [IU] | Freq: Once | INTRAVENOUS | Status: AC | PRN
  Administered 2019-06-30: 500 [IU]
  Filled 2019-06-30: qty 5

## 2019-06-30 MED ORDER — SODIUM CHLORIDE 0.9% IV SOLUTION
250.0000 mL | Freq: Once | INTRAVENOUS | Status: AC
  Administered 2019-06-30: 250 mL via INTRAVENOUS
  Filled 2019-06-30: qty 250

## 2019-06-30 MED ORDER — SODIUM CHLORIDE 0.9% FLUSH
10.0000 mL | INTRAVENOUS | Status: AC | PRN
  Administered 2019-06-30: 10 mL
  Filled 2019-06-30: qty 10

## 2019-06-30 NOTE — Telephone Encounter (Signed)
Received Voicemail from lab stating patient HGB 7.7 Ned Card NP made aware

## 2019-06-30 NOTE — Progress Notes (Addendum)
[place] OFFICE PROGRESS NOTE   Diagnosis: CLL, aplastic anemia  INTERVAL HISTORY:   Meagan Walsh returns as scheduled.  She was transfused a unit of platelets on 06/28/2019.  She is having mild intermittent gum bleeding.  She notes increased nausea/vomiting estimating 6 episodes over the past 1 week mostly associated with a dose of cyclosporine.  No fever.  No chills.  Energy level is poor.  No shortness of breath.  She continues to have left upper arm/shoulder pain, decreased range of motion.  Objective:  Vital signs in last 24 hours:  Blood pressure 100/67, pulse 90, temperature 98 F (36.7 C), temperature source Temporal, resp. rate 18, height '5\' 6"'  (1.676 m), weight 134 lb 1.6 oz (60.8 kg), SpO2 100 %.    HEENT: Scattered areas of gingival hypertrophy, erythema.  Dried blood noted various areas of the gums.  No active bleeding. Resp: Lungs clear bilaterally. Cardio: Regular rate and rhythm. GI: Abdomen soft and nontender.  No hepatosplenomegaly. Vascular: No leg edema. Musculoskeletal: Decreased range of motion with flexion left shoulder.  No swelling noted. Left upper extremity PICC site without erythema.  Lab Results:  Lab Results  Component Value Date   WBC 2.2 (L) 06/30/2019   HGB 7.7 (L) 06/30/2019   HCT 22.4 (L) 06/30/2019   MCV 86.5 06/30/2019   PLT 25 (L) 06/30/2019   NEUTROABS PENDING 06/30/2019    Imaging:  No results found.  Medications: I have reviewed the patient's current medications.  Assessment/Plan: 1. 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 Mild splenomegaly.  Ultrasound-guided biopsy of the left inguinal lymph node 09/18/2017, slightly "purulent  "fluid aspirated, core biopsy is consistent with an atypical lymphoid proliferation-extensive necrosis with surrounding epithelioid histiocytes, limited intact lymphoid tissue involved with CLL  Incisional biopsy of a necrotic/purulent left inguinal lymph node on 10/01/2017-extensive necrosis with granulomatous inflammation, small amount of viable lymphoid tissue involved with CLL, AFB and fungal stains negative  Peripheral blood FISH analysis 02/05/2018-deletion 13q14, no evidence of p53 (17p13) deletion, no evidence of 11q22deletion  Bone marrow biopsy 02/26/2018-hypercellular marrow with extensive involvement by CLL, lymphocytes represent85% of all cells  Ibrutinib initiated 04/03/2018  Ibrutinib placed on hold 04/11/2018 due to onset of arthralgias  Ibrutinib resumed 04/16/2018, discontinued 04/25/2018 secondary to severe arthralgias/arthritis  Ibrutinib resumed at a dose of 140 mg daily 05/03/2018  Ibrutinib dose adjusted to 140 mg alternating with 263m 06/25/2018  Ibrutinib discontinued 07/03/2018 secondary to severe arthralgias  Acalabrutinib 08/16/2018, discontinued 11/15/2018 secondary to persistent severe transfusion dependent anemia and neutropenia/thrombocytopenia, last dose 11/14/2018  Bone marrow biopsy 11/21/2018-decreased cellularity, involvement by CLL, decreased erythroid and granulocytic precursors, decreased megakaryocytes  Cycle 1 rituximab 12/06/2018  Bone marrow biopsy 12/24/2018 at [facility]-hypocellular bone marrow (10%) involved by CLL, representing 50% of marrow cellularity; markedly decreased trilineage hematopoiesis including essentially absent erythropoiesis  Bone marrow biopsy 03/06/2027 [facility]-hypocellular bone marrow (20%) with marked reduction of maturing hematopoietic elements; numerous lymphoid aggregates consistent with involvement by CLL, representing approximately 50% of marrow cellularity by flow cytometry  [facility] followed by  prednisone taper  2.Hypothyroidism 3.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 Augmentin  prescribed. 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 Tapered to 40 mg daily x1 week starting 01/24/19, reduce by 10 mg each week until discontinued. Prednisone discontinued 02/20/2019.  Promacta started 01/20/2019, dose increased to 100 mg daily 02/04/2019; dose increased to 150 mg daily 02/21/2019  IVIG daily for 2 days beginning 02/25/2019   9. 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 No lymphadenopathy or splenomegaly.  Exjade beginning 06/28/2019.  Disposition: Ms. Storie appears unchanged.  She continues to have severe pancytopenia.  She will not require platelets today.  She is symptomatic from the anemia and will be transfused 1 unit of blood.  Neutrophil count remains low.  She understands to contact the office with fever, chills, other signs of infection, bleeding.  Dr. Benay Spice discussed possibly resuming Promacta.  He plans to discuss further with Dr. Prince Solian at [facility].  The left shoulder  pain/decreased range of motion may be due to adhesive capsulitis.  She will try some stretching exercises.  She will return for PICC care and lab on 07/04/2019.  She will return for lab and follow-up on 07/07/2019.  Patient seen with Dr. Benay Spice.  25 minutes were spent face-to-face at today's visit with the majority of that time involved in counseling/coordination of care.  Ned Card ANP/GNP-BC   06/30/2019  11:25 AM This was a shared visit with Ned Card.  Ms. Mabe was interviewed and examined.  She has persistent pancytopenia.  We discussed treatment options with [name].  Her husband was present by telephone for today's visit.  I have communicated with the team at [facility].  They continue to manage the cyclosporine dosing.  I will correspond with Dr. Zara Council today regarding the addition of Promacta.  Ms. Nestle continues to have gum hypertrophy and nausea, likely secondary to cyclosporine.  The cyclosporine level is now in the low therapeutic range.  We remain hopeful for improvement in the pancytopenia over the next several weeks.  I think it is a good idea to add Promacta at this point.  She understands the allogeneic transplant will need to be considered if the pancytopenia has not improved within the next few weeks.  [name], MD

## 2019-06-30 NOTE — Telephone Encounter (Signed)
Received panic lab call from Lel[facility] Pons in Lab anc neutro abs 0.2 Ned Card NP made aware.

## 2019-07-01 ENCOUNTER — Encounter
Admit: 2019-07-01 | Discharge: 2019-07-02 | Payer: PRIVATE HEALTH INSURANCE | Attending: Hematology & Oncology | Primary: Hematology & Oncology

## 2019-07-01 ENCOUNTER — Telehealth: Payer: Self-pay | Admitting: Pharmacist

## 2019-07-01 ENCOUNTER — Telehealth: Payer: Self-pay

## 2019-07-01 ENCOUNTER — Telehealth: Payer: Self-pay | Admitting: *Deleted

## 2019-07-01 ENCOUNTER — Telehealth: Payer: Self-pay | Admitting: Oncology

## 2019-07-01 ENCOUNTER — Other Ambulatory Visit: Payer: Self-pay | Admitting: *Deleted

## 2019-07-01 DIAGNOSIS — K068 Other specified disorders of gingiva and edentulous alveolar ridge: Secondary | ICD-10-CM | POA: Diagnosis not present

## 2019-07-01 DIAGNOSIS — R110 Nausea: Secondary | ICD-10-CM | POA: Diagnosis not present

## 2019-07-01 DIAGNOSIS — C9110 Chronic lymphocytic leukemia of B-cell type not having achieved remission: Secondary | ICD-10-CM | POA: Diagnosis not present

## 2019-07-01 DIAGNOSIS — D619 Aplastic anemia, unspecified: Secondary | ICD-10-CM | POA: Diagnosis not present

## 2019-07-01 DIAGNOSIS — Z7682 Awaiting organ transplant status: Principal | ICD-10-CM

## 2019-07-01 LAB — BPAM RBC
Blood Product Expiration Date: 202012302359
ISSUE DATE / TIME: 202012071355
Unit Type and Rh: 5100

## 2019-07-01 LAB — TYPE AND SCREEN
ABO/RH(D): O POS
Antibody Screen: NEGATIVE
Unit division: 0

## 2019-07-01 MED ORDER — ELTROMBOPAG OLAMINE 50 MG PO TABS: 150.0000 mg | ORAL_TABLET | Freq: Every day | ORAL | 2 refills | Status: DC

## 2019-07-01 MED ORDER — CYCLOSPORINE 25 MG PO CAPS: 75.0000 mg | ORAL_CAPSULE | Freq: Two times a day (BID) | ORAL | 0 refills | Status: DC

## 2019-07-01 NOTE — Addendum Note (Signed)
Addended by: Tania Ade on: 07/01/2019 02:05 PM   Modules accepted: Orders

## 2019-07-01 NOTE — Telephone Encounter (Addendum)
Received fax from [facility]: Recommendation based on 12/4 labs is to continue cyclosporin at 175 mg bid. Refilled the 25 mg capsules per patient request.

## 2019-07-01 NOTE — Telephone Encounter (Signed)
Oral Oncology Pharmacist Encounter  Received restart prescription for Promacta (eltombopag) for the treatment of aplastic anemia, planned duration until disease progression or unacceptable drug toxicity.  CMP/CBC from 07/03/2019 assessed, no relevant lab abnormalities. Prescription dose and frequency assessed.   Current medication list in Epic reviewed, one DDIs with eltrombopag identified: -Cyclosporine: Cyclosporine my decrease the concentration of eltrombopag. This is not a new interaction for Meagan Walsh and her dose has been increased to 150mg . Continue to monitor.   Prescription has been e-scribed to the [facility] for benefits analysis and approval.  Oral Oncology Clinic will continue to follow for insurance authorization, copayment issues, initial counseling and start date.  Darl Pikes, PharmD, BCPS, [facility] Hematology/Oncology Clinical Pharmacist [facility]/AP Oral [place] [facility] [phone]  07/01/2019 3:52 PM

## 2019-07-01 NOTE — Progress Notes (Signed)
Per Dr. Prince Solian at [facility]: Needs to be on Promacta 150 mg daily. Dr. Benay Spice requested script be sent to [place].

## 2019-07-01 NOTE — Telephone Encounter (Signed)
Scheduled appt per 12/8 sch message - pt aware of appts date and time

## 2019-07-01 NOTE — Telephone Encounter (Signed)
Oral Oncology Patient Advocate Encounter  Received notification from [facility] that prior authorization for Promacta is required.  PA submitted on CoverMyMeds Key [id number] Status is pending  Oral Oncology Clinic will continue to follow.  [place] Patient [place] Phone [phone] Fax [phone] 07/01/2019 4:28 PM

## 2019-07-01 NOTE — Telephone Encounter (Signed)
Called to ask to move her lab/flush to Thursday and transfusion to Saturday if possible. She is having four friends come see her on Friday and it is very important for her to see them. She states the visits will be staggered on Friday. Per Dr. Benay Spice: OK to stagger visits with distancing and masks and no hugs. OK to move appointments as requested. Scheduling message sent. Patient notified of Dr. Gearldine Shown response. She is having a video visit with Dr. Prince Solian today and will call tomorrow with dosing of the cyclosporin. She still on occasion vomits in the morning after taking it despite premedication with ondansetron. She adds that they will be coming at the same time, but will visit outside and wear blankets. Stressed to her how dangerous it could be for her to get COVID. She verbalizes understanding. Cyclosporin results of 06/27/19 were faxed to [place] today att: Joellen Jersey, PharmD

## 2019-07-02 ENCOUNTER — Telehealth: Payer: Self-pay | Admitting: *Deleted

## 2019-07-02 ENCOUNTER — Telehealth: Payer: Self-pay

## 2019-07-02 NOTE — Telephone Encounter (Signed)
Oral Oncology Patient Advocate Encounter  Prior Authorization for Antony Blackbird has been approved.    PA# [id number] Effective dates: 07/01/19 through 06/29/20  Patients co-pay is $100  Oral Oncology Clinic will continue to follow.   [place] Patient [place] Phone [phone] Fax [phone] 07/02/2019 8:37 AM

## 2019-07-02 NOTE — Telephone Encounter (Signed)
Called to reports cyclosporin will remain 175mg  bid. Needs 25 mg capsules refilled (has 5 day supply left). Informed her that the cyclosporin will be ready at [place] tomorrow. Antony Blackbird is still under insurance review. Reports she vomited today shortly after taking the cyclosporin dose (also takes about 5 other pills at the same time). Suggested she take the cyclosporin alone; wait at least 30 minutes and take #2 other pills; wait at least 30 minutes and then take rest of morning meds. She agrees to will try.

## 2019-07-02 NOTE — Telephone Encounter (Signed)
Oral Oncology Patient Advocate Encounter   Was successful in obtaining a copay card for Promacta.  This copay card will make the patients copay $0.  I have spoken with the patient.    The billing information is as follows and has been shared with [name] .   RxBin: [id number] PCN: [id number] Member ID: [id number] Group ID: [id number]   [name] Patient [name] Phone [phone] Fax [phone] 07/02/2019 8:40 AM

## 2019-07-03 ENCOUNTER — Inpatient Hospital Stay: Payer: BC Managed Care – PPO

## 2019-07-03 ENCOUNTER — Other Ambulatory Visit: Payer: Self-pay | Admitting: Oncology

## 2019-07-03 ENCOUNTER — Other Ambulatory Visit: Payer: Self-pay | Admitting: *Deleted

## 2019-07-03 ENCOUNTER — Other Ambulatory Visit: Payer: Self-pay

## 2019-07-03 DIAGNOSIS — D696 Thrombocytopenia, unspecified: Secondary | ICD-10-CM

## 2019-07-03 DIAGNOSIS — C9110 Chronic lymphocytic leukemia of B-cell type not having achieved remission: Secondary | ICD-10-CM

## 2019-07-03 DIAGNOSIS — M25519 Pain in unspecified shoulder: Secondary | ICD-10-CM | POA: Diagnosis not present

## 2019-07-03 DIAGNOSIS — N939 Abnormal uterine and vaginal bleeding, unspecified: Secondary | ICD-10-CM | POA: Diagnosis not present

## 2019-07-03 DIAGNOSIS — D649 Anemia, unspecified: Secondary | ICD-10-CM

## 2019-07-03 DIAGNOSIS — Z95828 Presence of other vascular implants and grafts: Secondary | ICD-10-CM

## 2019-07-03 DIAGNOSIS — R112 Nausea with vomiting, unspecified: Secondary | ICD-10-CM | POA: Diagnosis not present

## 2019-07-03 DIAGNOSIS — Z79899 Other long term (current) drug therapy: Secondary | ICD-10-CM | POA: Diagnosis not present

## 2019-07-03 DIAGNOSIS — D801 Nonfamilial hypogammaglobulinemia: Secondary | ICD-10-CM | POA: Diagnosis not present

## 2019-07-03 DIAGNOSIS — R5381 Other malaise: Secondary | ICD-10-CM | POA: Diagnosis not present

## 2019-07-03 DIAGNOSIS — E039 Hypothyroidism, unspecified: Secondary | ICD-10-CM | POA: Diagnosis not present

## 2019-07-03 DIAGNOSIS — D61818 Other pancytopenia: Secondary | ICD-10-CM | POA: Diagnosis not present

## 2019-07-03 LAB — CBC WITH DIFFERENTIAL (CANCER CENTER ONLY)
Abs Immature Granulocytes: 0 10*3/uL (ref 0.00–0.07)
Basophils Absolute: 0 10*3/uL (ref 0.0–0.1)
Basophils Relative: 0 %
Eosinophils Absolute: 0 10*3/uL (ref 0.0–0.5)
Eosinophils Relative: 1 %
HCT: 26.3 % — ABNORMAL LOW (ref 36.0–46.0)
Hemoglobin: 9 g/dL — ABNORMAL LOW (ref 12.0–15.0)
Immature Granulocytes: 0 %
Lymphocytes Relative: 80 %
Lymphs Abs: 1.4 10*3/uL (ref 0.7–4.0)
MCH: 29.3 pg (ref 26.0–34.0)
MCHC: 34.2 g/dL (ref 30.0–36.0)
MCV: 85.7 fL (ref 80.0–100.0)
Monocytes Absolute: 0.2 10*3/uL (ref 0.1–1.0)
Monocytes Relative: 11 %
Neutro Abs: 0.2 10*3/uL — CL (ref 1.7–7.7)
Neutrophils Relative %: 8 %
Platelet Count: 16 10*3/uL — ABNORMAL LOW (ref 150–400)
RBC: 3.07 MIL/uL — ABNORMAL LOW (ref 3.87–5.11)
RDW: 13.4 % (ref 11.5–15.5)
WBC Count: 1.8 10*3/uL — ABNORMAL LOW (ref 4.0–10.5)
nRBC: 0 % (ref 0.0–0.2)

## 2019-07-03 LAB — SAMPLE TO BLOOD BANK

## 2019-07-03 LAB — CMP (CANCER CENTER ONLY)
ALT: 10 U/L (ref 0–44)
AST: 16 U/L (ref 15–41)
Albumin: 3.6 g/dL (ref 3.5–5.0)
Alkaline Phosphatase: 99 U/L (ref 38–126)
Anion gap: 9 (ref 5–15)
BUN: 25 mg/dL — ABNORMAL HIGH (ref 6–20)
CO2: 26 mmol/L (ref 22–32)
Calcium: 9.5 mg/dL (ref 8.9–10.3)
Chloride: 104 mmol/L (ref 98–111)
Creatinine: 1.35 mg/dL — ABNORMAL HIGH (ref 0.44–1.00)
GFR, Est AFR Am: 50 mL/min — ABNORMAL LOW (ref >60)
GFR, Estimated: 43 mL/min — ABNORMAL LOW (ref >60)
Glucose, Bld: 112 mg/dL — ABNORMAL HIGH (ref 70–99)
Potassium: 4.6 mmol/L (ref 3.5–5.1)
Sodium: 139 mmol/L (ref 135–145)
Total Bilirubin: 0.9 mg/dL (ref 0.3–1.2)
Total Protein: 5.9 g/dL — ABNORMAL LOW (ref 6.5–8.1)

## 2019-07-03 MED ORDER — SODIUM CHLORIDE 0.9% FLUSH
10.0000 mL | INTRAVENOUS | Status: DC | PRN
  Administered 2019-07-03: 10 mL
  Filled 2019-07-03: qty 10

## 2019-07-03 MED ORDER — HEPARIN SOD (PORK) LOCK FLUSH 100 UNIT/ML IV SOLN
500.0000 [IU] | Freq: Once | INTRAVENOUS | Status: AC | PRN
  Administered 2019-07-03: 500 [IU]
  Filled 2019-07-03: qty 5

## 2019-07-03 MED ORDER — CYCLOSPORINE MODIFIED (NEORAL) 25 MG PO CAPS: 75.0000 mg | ORAL_CAPSULE | Freq: Two times a day (BID) | ORAL | 0 refills | Status: DC

## 2019-07-03 MED FILL — CYCLOSPORINE MODIFIED 25 MG: 25 | 30 days supply | Qty: 180 | Fill #0

## 2019-07-03 NOTE — Telephone Encounter (Signed)
Oral Chemotherapy Pharmacist Encounter  Patient plans on picking her Promacta from [facility] on 07/05/2019.  Patient Education I spoke with patient for overview of Promacta (eltrombopag) for the treatment of aplastic anemia, planned duration until disease progression or unacceptable drug toxicity.   Counseled patient on administration, dosing, side effects, monitoring, drug-food interactions, safe handling, storage, and disposal. She stated she did not reminder having any issues with the medication when she took it pereviously. Patient will take 3 tablets (150 mg total) by mouth daily. Take on an empty stomach 1 hour before a meal or 2 hours after. **Discussed the option to switch to two 75mg  tablets in the future to decrease her pill burden.  Side effects include but not limited to: N/V.    Reviewed with patient importance of keeping a medication schedule and plan for any missed doses.  Ms. Cirrincione voiced understanding and appreciation. All questions answered. Medication handout placed in the mail.  Provided patient with [facility] phone number. Patient knows to call the office with questions or concerns. Oral Chemotherapy Navigation Clinic will continue to follow.  Darl Pikes, PharmD, BCPS, [facility] Hematology/Oncology Clinical Pharmacist [facility]/HP/AP Oral [place] [phone]  07/03/2019 3:05 PM

## 2019-07-03 NOTE — Telephone Encounter (Signed)
Pickup by  [facility] scheduled at [facility] on 07/05/19  Meagan Walsh [place] Phone [phone] Fax [phone] 07/03/2019 2:44 PM

## 2019-07-03 NOTE — Progress Notes (Signed)
MD aware of ANC 0.2, Hgb 9.0 and platelets 16,000. Patient denies any bleeding at this time, but count was 25,00 on Monday. Per Dr. Benay Spice: Transfuse 1 unit of crossmatched platelets on Saturday. Blood band notified and will have these available for Saturday. Patient is aware.

## 2019-07-04 ENCOUNTER — Encounter: Admit: 2019-07-04 | Discharge: 2019-07-05 | Payer: PRIVATE HEALTH INSURANCE

## 2019-07-04 ENCOUNTER — Inpatient Hospital Stay: Payer: BC Managed Care – PPO

## 2019-07-04 ENCOUNTER — Other Ambulatory Visit: Payer: Self-pay | Admitting: *Deleted

## 2019-07-04 DIAGNOSIS — Z9484 Stem cells transplant status: Secondary | ICD-10-CM | POA: Diagnosis not present

## 2019-07-04 MED ORDER — CYCLOBENZAPRINE HCL 10 MG PO TABS: 10.0000 mg | ORAL_TABLET | Freq: Two times a day (BID) | ORAL | 0 refills | Status: DC | PRN

## 2019-07-04 MED FILL — PROMACTA 50 MG TABLET: 50 | 30 days supply | Qty: 90 | Fill #0

## 2019-07-04 NOTE — Progress Notes (Signed)
Received refill request for cyclobenzaprine 10 mg from [facility]. Has not been filled since 11/2018.OK to refill x 1 per Dr. Benay Spice.

## 2019-07-05 ENCOUNTER — Other Ambulatory Visit: Payer: Self-pay

## 2019-07-05 ENCOUNTER — Inpatient Hospital Stay: Payer: BC Managed Care – PPO

## 2019-07-05 VITALS — BP 105/59 | HR 79 | Temp 98.7°F | Resp 18

## 2019-07-05 DIAGNOSIS — D696 Thrombocytopenia, unspecified: Secondary | ICD-10-CM

## 2019-07-05 DIAGNOSIS — Z95828 Presence of other vascular implants and grafts: Secondary | ICD-10-CM

## 2019-07-05 DIAGNOSIS — Z79899 Other long term (current) drug therapy: Secondary | ICD-10-CM | POA: Diagnosis not present

## 2019-07-05 DIAGNOSIS — R5381 Other malaise: Secondary | ICD-10-CM | POA: Diagnosis not present

## 2019-07-05 DIAGNOSIS — M25519 Pain in unspecified shoulder: Secondary | ICD-10-CM | POA: Diagnosis not present

## 2019-07-05 DIAGNOSIS — R112 Nausea with vomiting, unspecified: Secondary | ICD-10-CM | POA: Diagnosis not present

## 2019-07-05 DIAGNOSIS — E039 Hypothyroidism, unspecified: Secondary | ICD-10-CM | POA: Diagnosis not present

## 2019-07-05 DIAGNOSIS — N939 Abnormal uterine and vaginal bleeding, unspecified: Secondary | ICD-10-CM | POA: Diagnosis not present

## 2019-07-05 DIAGNOSIS — D801 Nonfamilial hypogammaglobulinemia: Secondary | ICD-10-CM | POA: Diagnosis not present

## 2019-07-05 DIAGNOSIS — D61818 Other pancytopenia: Secondary | ICD-10-CM | POA: Diagnosis not present

## 2019-07-05 DIAGNOSIS — C9110 Chronic lymphocytic leukemia of B-cell type not having achieved remission: Secondary | ICD-10-CM

## 2019-07-05 LAB — CYCLOSPORINE: Cyclosporine, LabCorp: 998 ng/mL — ABNORMAL HIGH (ref 100–400)

## 2019-07-05 MED ORDER — HEPARIN SOD (PORK) LOCK FLUSH 100 UNIT/ML IV SOLN
250.0000 [IU] | INTRAVENOUS | Status: DC | PRN
  Filled 2019-07-05: qty 5

## 2019-07-05 MED ORDER — HEPARIN SOD (PORK) LOCK FLUSH 100 UNIT/ML IV SOLN
500.0000 [IU] | Freq: Once | INTRAVENOUS | Status: AC | PRN
  Administered 2019-07-05: 250 [IU]
  Filled 2019-07-05: qty 5

## 2019-07-05 MED ORDER — SODIUM CHLORIDE 0.9% FLUSH
10.0000 mL | INTRAVENOUS | Status: DC | PRN
  Filled 2019-07-05: qty 10

## 2019-07-05 MED ORDER — SODIUM CHLORIDE 0.9% IV SOLUTION
250.0000 mL | Freq: Once | INTRAVENOUS | Status: AC
  Administered 2019-07-05: 250 mL via INTRAVENOUS
  Filled 2019-07-05: qty 250

## 2019-07-05 MED ORDER — SODIUM CHLORIDE 0.9% FLUSH
10.0000 mL | INTRAVENOUS | Status: DC | PRN
  Administered 2019-07-05: 10 mL
  Filled 2019-07-05: qty 10

## 2019-07-05 NOTE — Patient Instructions (Signed)

## 2019-07-07 ENCOUNTER — Inpatient Hospital Stay (HOSPITAL_BASED_OUTPATIENT_CLINIC_OR_DEPARTMENT_OTHER): Payer: BC Managed Care – PPO | Admitting: Oncology

## 2019-07-07 ENCOUNTER — Inpatient Hospital Stay: Payer: BC Managed Care – PPO

## 2019-07-07 ENCOUNTER — Other Ambulatory Visit: Payer: Self-pay | Admitting: *Deleted

## 2019-07-07 ENCOUNTER — Other Ambulatory Visit: Payer: Self-pay

## 2019-07-07 ENCOUNTER — Telehealth: Payer: Self-pay | Admitting: *Deleted

## 2019-07-07 VITALS — BP 96/53 | HR 91 | Resp 18 | Ht 66.0 in | Wt 132.3 lb

## 2019-07-07 DIAGNOSIS — C9110 Chronic lymphocytic leukemia of B-cell type not having achieved remission: Secondary | ICD-10-CM

## 2019-07-07 DIAGNOSIS — E039 Hypothyroidism, unspecified: Secondary | ICD-10-CM | POA: Diagnosis not present

## 2019-07-07 DIAGNOSIS — D649 Anemia, unspecified: Secondary | ICD-10-CM

## 2019-07-07 DIAGNOSIS — R112 Nausea with vomiting, unspecified: Secondary | ICD-10-CM | POA: Diagnosis not present

## 2019-07-07 DIAGNOSIS — R5381 Other malaise: Secondary | ICD-10-CM | POA: Diagnosis not present

## 2019-07-07 DIAGNOSIS — D801 Nonfamilial hypogammaglobulinemia: Secondary | ICD-10-CM | POA: Diagnosis not present

## 2019-07-07 DIAGNOSIS — D696 Thrombocytopenia, unspecified: Secondary | ICD-10-CM

## 2019-07-07 DIAGNOSIS — M25519 Pain in unspecified shoulder: Secondary | ICD-10-CM | POA: Diagnosis not present

## 2019-07-07 DIAGNOSIS — Z95828 Presence of other vascular implants and grafts: Secondary | ICD-10-CM

## 2019-07-07 DIAGNOSIS — D61818 Other pancytopenia: Secondary | ICD-10-CM | POA: Diagnosis not present

## 2019-07-07 DIAGNOSIS — Z79899 Other long term (current) drug therapy: Secondary | ICD-10-CM | POA: Diagnosis not present

## 2019-07-07 DIAGNOSIS — N939 Abnormal uterine and vaginal bleeding, unspecified: Secondary | ICD-10-CM | POA: Diagnosis not present

## 2019-07-07 DIAGNOSIS — D619 Aplastic anemia, unspecified: Principal | ICD-10-CM

## 2019-07-07 LAB — SAMPLE TO BLOOD BANK

## 2019-07-07 LAB — CBC WITH DIFFERENTIAL (CANCER CENTER ONLY)
Abs Immature Granulocytes: 0 10*3/uL (ref 0.00–0.07)
Basophils Absolute: 0 10*3/uL (ref 0.0–0.1)
Basophils Relative: 0 %
Eosinophils Absolute: 0 10*3/uL (ref 0.0–0.5)
Eosinophils Relative: 0 %
HCT: 22.8 % — ABNORMAL LOW (ref 36.0–46.0)
Hemoglobin: 7.7 g/dL — ABNORMAL LOW (ref 12.0–15.0)
Lymphocytes Relative: 76 %
Lymphs Abs: 2 10*3/uL (ref 0.7–4.0)
MCH: 28.5 pg (ref 26.0–34.0)
MCHC: 33.8 g/dL (ref 30.0–36.0)
MCV: 84.4 fL (ref 80.0–100.0)
Monocytes Absolute: 0.2 10*3/uL (ref 0.1–1.0)
Monocytes Relative: 6 %
Neutro Abs: 0.5 10*3/uL — ABNORMAL LOW (ref 1.7–7.7)
Neutrophils Relative %: 18 %
Platelet Count: 29 10*3/uL — ABNORMAL LOW (ref 150–400)
RBC: 2.7 MIL/uL — ABNORMAL LOW (ref 3.87–5.11)
RDW: 13.2 % (ref 11.5–15.5)
WBC Count: 2.6 10*3/uL — ABNORMAL LOW (ref 4.0–10.5)
nRBC: 0 % (ref 0.0–0.2)

## 2019-07-07 LAB — BPAM PLATELET PHERESIS
Blood Product Expiration Date: 202012132359
ISSUE DATE / TIME: 202012120958
Unit Type and Rh: 5100

## 2019-07-07 LAB — PREPARE PLATELET PHERESIS: Unit division: 0

## 2019-07-07 LAB — BASIC METABOLIC PANEL - CANCER CENTER ONLY
Anion gap: 10 (ref 5–15)
BUN: 26 mg/dL — ABNORMAL HIGH (ref 6–20)
CO2: 24 mmol/L (ref 22–32)
Calcium: 9.2 mg/dL (ref 8.9–10.3)
Chloride: 103 mmol/L (ref 98–111)
Creatinine: 1.58 mg/dL — ABNORMAL HIGH (ref 0.44–1.00)
GFR, Est AFR Am: 41 mL/min — ABNORMAL LOW (ref >60)
GFR, Estimated: 35 mL/min — ABNORMAL LOW (ref >60)
Glucose, Bld: 118 mg/dL — ABNORMAL HIGH (ref 70–99)
Potassium: 4.2 mmol/L (ref 3.5–5.1)
Sodium: 137 mmol/L (ref 135–145)

## 2019-07-07 LAB — PREPARE RBC (CROSSMATCH)

## 2019-07-07 MED ORDER — HEPARIN SOD (PORK) LOCK FLUSH 100 UNIT/ML IV SOLN
500.0000 [IU] | Freq: Every day | INTRAVENOUS | Status: AC | PRN
  Administered 2019-07-07: 500 [IU]
  Filled 2019-07-07: qty 5

## 2019-07-07 MED ORDER — SODIUM CHLORIDE 0.9% FLUSH
10.0000 mL | INTRAVENOUS | Status: AC | PRN
  Administered 2019-07-07: 10 mL
  Filled 2019-07-07: qty 10

## 2019-07-07 MED ORDER — SODIUM CHLORIDE 0.9% IV SOLUTION
250.0000 mL | Freq: Once | INTRAVENOUS | Status: AC
  Administered 2019-07-07: 250 mL via INTRAVENOUS
  Filled 2019-07-07: qty 250

## 2019-07-07 MED ORDER — HEPARIN SOD (PORK) LOCK FLUSH 100 UNIT/ML IV SOLN
500.0000 [IU] | Freq: Once | INTRAVENOUS | Status: DC | PRN
  Filled 2019-07-07: qty 5

## 2019-07-07 MED ORDER — SODIUM CHLORIDE 0.9% FLUSH
10.0000 mL | INTRAVENOUS | Status: DC | PRN
  Filled 2019-07-07: qty 10

## 2019-07-07 MED ORDER — TACROLIMUS 1 MG CAPSULE
ORAL_CAPSULE | Freq: Two times a day (BID) | ORAL | 2 refills | 30 days | Status: CP
Start: 2019-07-07 — End: 2020-07-06
  Filled 2019-07-09: qty 60, 30d supply, fill #0

## 2019-07-07 NOTE — Progress Notes (Signed)
Faxed 07/07/19 labs and cyclosporin result to [facility] [phone] att: Joellen Jersey, PharmD.

## 2019-07-07 NOTE — Progress Notes (Signed)
Patient thinks she took the cyclosporin dose on Thursday prior to lab draw since she was used to labs on Friday. She and her husband do not think she took her cyclosporin today. She ran out of 25 mg and took 200 mg last night. Will redraw lab today.

## 2019-07-07 NOTE — Patient Instructions (Signed)
Blood Transfusion, Adult, Care After This sheet gives you information about how to care for yourself after your procedure. Your doctor may also give you more specific instructions. If you have problems or questions, contact your doctor. Follow these instructions at home:   Take over-the-counter and prescription medicines only as told by your doctor.  Go back to your normal activities as told by your doctor.  Follow instructions from your doctor about how to take care of the area where an IV tube was put into your vein (insertion site). Make sure you: ? Wash your hands with soap and water before you change your bandage (dressing). If there is no soap and water, use hand sanitizer. ? Change your bandage as told by your doctor.  Check your IV insertion site every day for signs of infection. Check for: ? More redness, swelling, or pain. ? More fluid or blood. ? Warmth. ? Pus or a bad smell. Contact a doctor if:  You have more redness, swelling, or pain around the IV insertion site.  You have more fluid or blood coming from the IV insertion site.  Your IV insertion site feels warm to the touch.  You have pus or a bad smell coming from the IV insertion site.  Your pee (urine) turns pink, red, or brown.  You feel weak after doing your normal activities. Get help right away if:  You have signs of a serious allergic or body defense (immune) system reaction, including: ? Itchiness. ? Hives. ? Trouble breathing. ? Anxiety. ? Pain in your chest or lower back. ? Fever, flushing, and chills. ? Fast pulse. ? Rash. ? Watery poop (diarrhea). ? Throwing up (vomiting). ? Dark pee. ? Serious headache. ? Dizziness. ? Stiff neck. ? Yellow color in your face or the white parts of your eyes (jaundice). Summary  After a blood transfusion, return to your normal activities as told by your doctor.  Every day, check for signs of infection where the IV tube was put into your vein.  Some  signs of infection are warm skin, more redness and pain, more fluid or blood, and pus or a bad smell where the needle went in.  Contact your doctor if you feel weak or have any unusual symptoms. This information is not intended to replace advice given to you by your health care provider. Make sure you discuss any questions you have with your health care provider. Document Released: 07/31/2014 Document Revised: 11/14/2017 Document Reviewed: 03/03/2016 Elsevier Patient Education  2020 Elsevier Inc.  

## 2019-07-07 NOTE — Progress Notes (Signed)
[place] OFFICE PROGRESS NOTE   Diagnosis: CLL INTERVAL HISTORY:   Meagan Walsh returns for a scheduled visit.  She continues cyclosporine.  She was transfused with platelets on 07/05/2019.  She last received packed red blood cells on 06/30/2019.  She began eltrombopag  on 07/05/2019.  She continues deferasirox.  No fever or bleeding.  She continues to have discomfort with posterior extension at the bilateral shoulder joint.  She complains of malaise.  Her chief complaint is nausea with intermittent emesis.  Her appetite is now poor.  Objective:  Vital signs in last 24 hours:  Blood pressure (!) 96/53, pulse 91, resp. rate 18, height '5\' 6"'$  (1.676 m), weight 132 lb 4.8 oz (60 kg), SpO2 100 %.    HEENT: Small amount of dried blood at the upper anterior gumline.  No active bleeding.  No thrush.  Gum hypertrophy. Lymphatics: No cervical or supraclavicular nodes Resp: Lungs clear bilaterally Cardio: Regular rate and rhythm GI: No hepatosplenomegaly, nontender Vascular: No leg edema Musculoskeletal: Discomfort with posterior extension of the shoulder bilaterally  Portacath/PICC-without erythema  Lab Results:  Lab Results  Component Value Date   WBC 2.6 (L) 07/07/2019   HGB 7.7 (L) 07/07/2019   HCT 22.8 (L) 07/07/2019   MCV 84.4 07/07/2019   PLT 29 (L) 07/07/2019   NEUTROABS 0.5 (L) 07/07/2019    CMP  Lab Results  Component Value Date   NA 137 07/07/2019   K 4.2 07/07/2019   CL 103 07/07/2019   CO2 24 07/07/2019   GLUCOSE 118 (H) 07/07/2019   BUN 26 (H) 07/07/2019   CREATININE 1.58 (H) 07/07/2019   CALCIUM 9.2 07/07/2019   PROT 5.9 (L) 07/03/2019   ALBUMIN 3.6 07/03/2019   AST 16 07/03/2019   ALT 10 07/03/2019   ALKPHOS 99 07/03/2019   BILITOT 0.9 07/03/2019   GFRNONAA 35 (L) 07/07/2019   GFRAA 41 (L) 07/07/2019    No results found for: CEA1  Lab Results  Component Value Date   INR 1.4 (H) 12/04/2018    Imaging:  No results  found.  Medications: I have reviewed the patient's current medications.   Assessment/Plan: 1. 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 Mild splenomegaly.  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, no evidence of 11q22deletion  Bone marrow biopsy 02/26/2018-hypercellular marrow with extensive involvement by CLL, lymphocytes represent85% of all cells  Ibrutinib initiated 04/03/2018  Ibrutinib placed on hold 04/11/2018 due to onset of arthralgias  Ibrutinib resumed 04/16/2018, discontinued 04/25/2018 secondary to severe arthralgias/arthritis  Ibrutinib resumed at a dose of 140 mg daily 05/03/2018  Ibrutinib dose adjusted to 140 mg alternating with '280mg'$  06/25/2018  Ibrutinib discontinued 07/03/2018 secondary to severe arthralgias  Acalabrutinib 08/16/2018, discontinued 11/15/2018 secondary to persistent severe transfusion dependent anemia and neutropenia/thrombocytopenia, last dose 11/14/2018  Bone marrow biopsy 11/21/2018-decreased cellularity, involvement by CLL, decreased erythroid and granulocytic precursors, decreased megakaryocytes  Cycle 1 rituximab  12/06/2018  Bone marrow biopsy 12/24/2018 at [facility]-hypocellular bone marrow (10%) involved by CLL, representing 50% of marrow cellularity; markedly decreased trilineage hematopoiesis including essentially absent erythropoiesis  Bone marrow biopsy 03/06/2027 [facility]-hypocellular bone marrow (20%) with marked reduction of maturing hematopoietic elements; numerous lymphoid aggregates consistent with involvement by CLL, representing approximately 50% of marrow cellularity by flow cytometry  ATG/cyclosporine at [facility] beginning 03/19/2019 followed by prednisone taper  Eltrombopag beginning 07/05/2019  2.Hypothyroidism 3.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 Augmentin prescribed. 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 Pancytopenia secondary to CLL and a hypocellular bone marrow  G-CSF and Nplate started 1/68/6104, G-CSF changed to daily beginning 12/17/2018; G-CSF discontinued 12/31/2018, last Nplate 01/15/2019  Began prednisone 60 mg daily 01/11/19, tolerating moderately well except jitteriness, irritability, and difficulty sleeping. Tapered to 40 mg daily x1 week starting 01/24/19, reduce by 10 mg each week until discontinued. Prednisone discontinued 02/20/2019.  Promacta started 01/20/2019, dose increased to 100 mg daily 02/04/2019; dose increased to 150 mg daily 02/21/2019  IVIG daily for 2 days beginning 02/25/2019   9. 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 No lymphadenopathy or splenomegaly.  Exjade beginning 06/28/2019. 13.  Elevated creatinine-cyclosporine toxicity?    Disposition: Ms. Weger appears unchanged.  She has persistent pancytopenia.  She has required weekly platelet and red cell transfusion support.  The platelets and neutrophils are higher today.  The cyclosporine level was supratherapeutic on 07/03/2019.  She reports taking the cyclosporine dose prior to the blood draw that morning.  We repeated a cyclosporine level today.  I will communicate with [facility] regarding the cyclosporine dosing and elevated creatinine.  She continues to have significant nausea, most likely secondary to cyclosporine.  Dr. Prince Solian is considering a switch to tacrolimus.  Ms. Gutierrez will receive 1 unit of packed red cells today for treatment of symptomatic anemia.  She will return for a lab visit on 07/11/2019 and an office visit in 1 week.  She continues to pretransplant process at [facility].  Betsy Coder, MD  07/07/2019  1:29 PM

## 2019-07-07 NOTE — Telephone Encounter (Addendum)
Informed patient that per Dr. Donah Driver cyclosporin dose until after she speaks with Dr. Prince Solian tomorrow.  She understands and agrees. Per [place] they are working towards starting her on tacrolimus. Joellen Jersey is working on getting the drug. Once started it will require weekly levels.

## 2019-07-08 ENCOUNTER — Encounter
Admit: 2019-07-08 | Discharge: 2019-07-08 | Payer: PRIVATE HEALTH INSURANCE | Attending: Hematology & Oncology | Primary: Hematology & Oncology

## 2019-07-08 ENCOUNTER — Institutional Professional Consult (permissible substitution)
Admit: 2019-07-08 | Discharge: 2019-07-08 | Payer: PRIVATE HEALTH INSURANCE | Attending: Pharmacist | Primary: Pharmacist

## 2019-07-08 ENCOUNTER — Encounter: Admit: 2019-07-08 | Discharge: 2019-07-08 | Payer: PRIVATE HEALTH INSURANCE

## 2019-07-08 ENCOUNTER — Telehealth: Payer: Self-pay | Admitting: *Deleted

## 2019-07-08 ENCOUNTER — Other Ambulatory Visit: Payer: Self-pay | Admitting: *Deleted

## 2019-07-08 ENCOUNTER — Telehealth: Payer: Self-pay | Admitting: Oncology

## 2019-07-08 DIAGNOSIS — R110 Nausea: Secondary | ICD-10-CM | POA: Diagnosis not present

## 2019-07-08 DIAGNOSIS — D619 Aplastic anemia, unspecified: Secondary | ICD-10-CM | POA: Diagnosis not present

## 2019-07-08 DIAGNOSIS — C9100 Acute lymphoblastic leukemia not having achieved remission: Secondary | ICD-10-CM | POA: Diagnosis not present

## 2019-07-08 LAB — TYPE AND SCREEN
ABO/RH(D): O POS
Antibody Screen: NEGATIVE
Unit division: 0

## 2019-07-08 LAB — BPAM RBC
Blood Product Expiration Date: 202101112359
ISSUE DATE / TIME: 202012141204
Unit Type and Rh: 5100

## 2019-07-08 NOTE — Unmapped (Signed)
Please return in 3 weeks for a follow up visit and labs.    Please call us if you experience:    1. Nausea or vomiting not controlled by nausea medicines  2. Fever of 100.5 F or higher, shaking chills, drenching night sweats.  3. Uncontrolled pain  4. Any rapidly enlarging lymph node or mass  5. Unintentional weight loss  6. Any other concerning symptom     MyChart Messages  For your safety and best care, please DO NOT use MyChart messages to report symptoms. (Symptoms should be reported by calling the nurse triage line). Please use MyChart for non-urgent matters such as general questions, non-urgent prescription refills, or non-urgent scheduling issues.     ?? Please do not use MyChart for URGENT messages, as messages are only checked during regular business hours.     ?? Please note that MyChart messages may be routed a central pool and one of your provider???s team members will get back to you.  - Expect up to 3 business days for response     If you have any other questions, please do not hesitate to contact us.    Nurse Navigator: Milinda Antis, RN      For health related questions Monday through Friday 8 AM??? 5 PM : please call the office at [phone] and ask to speak with a nurse.  For appointment changes call: Main Clinic [phone].  Toll free number is [phone].    On Nights, Weekends and Holidays:  Call [phone] and ask for the adult hematologist/oncologist on call.      [facility]  [address]  [place], [place] [address]

## 2019-07-08 NOTE — Unmapped (Signed)
[facility] Specialty Medication Onboarding    Specialty Medication: Tacrolimus  Prior Authorization: Not Required   Financial Assistance: No - copay  <$25  Final Copay/Day Supply: 10.00 / 30    Insurance Restrictions: None     Notes to Pharmacist: N/A    The triage team has completed the benefits investigation and has determined that the patient is able to fill this medication at [facility]. Please contact the patient to complete the onboarding or follow up with the prescribing physician as needed.

## 2019-07-08 NOTE — Unmapped (Signed)
I spent 10-15 minutes on the video with the patient. I spent an additional 10-15 minutes on pre- and post-visit activities.     The patient was physically located in [place] or a state in which I am permitted to provide care. The patient and/or parent/guardian understood that s/he may incur co-pays and cost sharing, and agreed to the telemedicine visit. The visit was reasonable and appropriate under the circumstances given the patient's presentation at the time.    The patient and/or parent/guardian has been advised of the potential risks and limitations of this mode of treatment (including, but not limited to, the absence of in-person examination) and has agreed to be treated using telemedicine. The patient's/patient's family's questions regarding telemedicine have been answered.     If the visit was completed in an ambulatory setting, the patient and/or parent/guardian has also been advised to contact their provider???s office for worsening conditions, and seek emergency medical treatment and/or call 911 if the patient deems either necessary.    [facility] Follow-up: virtual visit      Patient Name: Susan Kane  Patient Age: 58 y.o.  Encounter Date: 07/08/2019    Primary Care Provider:  Thora Lance, MD    Referring Physician:  Lillia Mountain, MD  [address]  [address]  [place],  [place] [address]    Chief complaint: Here for follow up of : CLL now with aplastic anemia (possibly 2/t acalabrutinib)    Assessment:  Susan Kane is a 58 y.o. year old female with history of CLL initially treated with ibrutinib. Unfortunately she was not able to tolerate this due to severe arthralgias. Therapy was changed to acalabrutinib, which was much better tolerated with no recurrence of arthralgias, but with progressive bone marrow failure as evidenced by ongoing transfusion dependence, worsening thrombocytopenia and neutropenia.   ??  Reviewed pt's case with Susan Kane and also presented at our multidisciplinary hem malignancy conference on multiple occasions - while not typical, her course was concerning for development of aplastic anemia possibly due to the acalabrutinib.     Acalabrutinib was discontinued and she was placed on Promacta as well as corticosteroids in addition to a x1 dose of Rituximab. This resulted in a brief but not durable response.     BMBx was performed on 8/14 which demonstrated  A hypocellular bone marrow (20%) with marked reduction of maturing hematopoietic elements. Numerous lymphoid aggregates, consistent with involvement by chronic lymphocytic leukemia, representing ~50% of marrow cellularity by flow cytometry. No clonal abnormalities to suggest MDS.    Proceeded with ATG-cyclosporine (8/26=Day 1) and she tolerated the ATG with just a mild reaction on day 1.    However she has experienced significant n/v while on cyclosporine and her levels have been subtherapeutic despite dose adjustments by our pharmacist, Susan Kane. In addition, she remains quite neutropenic and thrombocytopenic.    After discussions with Susan Kane, Truett Kane, and Susan Kane we have decided on the plan detailed below. If the n/v proves intractable, will likely switch to tacrolimus.  In addition, we are consulting BMT to evaluate for potential transplant. We discussed with the patient that we generally evaluate in 6 months if therapy was providing sufficient benefit.      Plan and Recommendations:   1. Aplastic Anemia  - discontinue CsA (pt didn't take last night dose but did take this am's dose - will stay off of it moving forward)  - changing to tacrolimus to see if  tolerates better   - PharmD visit to discuss tacro initiation  - added back eltrombopag 12/7  - cont posaconazole 300mg  daily - plan to continue even if ANC >0.5 with goal of maintaining cyclosporine level stability  - antiemetics described below  - Continue levofloxacin ppx while ANC <0.5   - Continue bactrim and valtrex (usually on for about a year after ATG, can check CD4 level in several months to help determine when ok to stop)  - defer to PharmD on tacro monitoring   - appreciate BMT team input, pt will be getting organ function testing in January  - rtc in 3 weeks    2. Iron overload  - exjade for iron overload     3. CLL  - hold CLL directed therapy but discussed may re-initiate prior to BMT to debulk remaining disease leading up to BMT if we go BMT route (would likely use venetoclax but this will likely markedly worsen her counts so no rush to start)    4. Nausea: improved with following program: zofran, took the CsA 30 min later, then wait another hour before taking other meds.   - changing to tacrolimus, will wait and see how this is tolerated from GI perspective    4. AKI  - repeat labs Friday, query if from supratherapeutic CsA level (coming off CsA)    07/08/19    History of Present Illness:  Oncology History Overview Note   CLL:    Summary as per Susan Kane most recent note with minor edits/additions from my review of records    1.??CLL?????diagnosed in August 2010, flow cytometry consistent with CLL  ?? Enlarged left??inguinal lymph node January 2019,??small??neck/axillary nodes and palpable splenomegaly 09/11/2017  ?? CTs??on 09/17/2017-3 cm necrotic appearing lymph node in the left inguinal region, borderline enlarged pelvic/retroperitoneal, chest, and axillary nodes. ??Mild splenomegaly.  ?? Ultrasound-guided biopsy of the left inguinal lymph node 09/18/2017, slightly purulent fluid aspirated, core biopsy is consistent with an atypical lymphoid proliferation???extensive necrosis with surrounding epithelioid histiocytes, limited intact lymphoid tissue involved with CLL  ?? Incisional biopsy of a necrotic/purulent left inguinal lymph node on 10/01/2017???extensive necrosis with granulomatous inflammation, small amount of viable lymphoid tissue involved with CLL, AFB and fungal stains negative  ?? Peripheral blood FISH analysis 02/05/2018??? +deletion 13q14, no evidence of p53 (17p13) deletion, no evidence of 11q22??deletion  - normal karyotype (46,XX)  ?? Bone marrow biopsy 02/26/2018???hypercellular marrow with extensive involvement by CLL, lymphocytes represent??85% of all cells  ?? Ibrutinib initiated 04/03/2018  ?? Ibrutinib placed on hold 04/11/2018 due to onset of arthralgias  ?? Ibrutinib resumed 04/16/2018, discontinued 04/25/2018 secondary to severe arthralgias/arthritis  ?? Ibrutinib resumed at a dose of 140 mg daily 05/03/2018  ?? Ibrutinib dose adjusted to 140 mg alternating with 280 mg 06/25/2018  ?? 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 ??Augmentin prescribed.  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    Baseline BM bx reviewed at [facility]  Final Diagnosis   (Outside Case #:  [id number], dated 02/26/2018)  Bone marrow, aspiration and biopsy  -  Hypercellular bone marrow (80%) with extensive involvement by chronic lymphocytic leukemia (87% lymphocytes by manual aspirate differential)  (See Comment)  -  By outside report, cytogenetic results  are normal     Karyotype: 66, XX and FISH with 13q del but no 11q or 17p del    Repeat BM bx: (done after being off ibrutinib x1 month)  80% involvement by CLL    08/13/18: Presents to [facility] to discuss plan of care     Chronic lymphocytic leukemia (CLL), B-cell (CMS-HCC)   07/12/2018 Initial Diagnosis    Chronic lymphocytic leukemia (CLL), B-cell (CMS-HCC)     08/26/2018 -  Chemotherapy    Acalabrutinib started (approximate date)     11/14/2018 -  Chemotherapy    Acalabrutinib discontinued due to progressive bone marrow failure     12/06/2018 -  Chemotherapy    Ritux x1 given due to CLL still making up majority of BM cellularity (though in the setting of near aplasia of rest of TLH)     03/19/2019 -  Chemotherapy    [facility] ATG / CsA for aplastic anemia (suspect 2/t acalabrutinib)         Interval History:  She hasn't had nausea and vomiting past week which is promising with respect to CsA tolerability.     Tried zofran, took the CsA 30 min later, then wait another hour before taking other meds. This may have contributed to better tolerability.    However her creat bumped which may be due to very high CsA level. More tremulous the past week as well.    We discussed trying tacrolimus instead 1) to see if better tolerable from a GI perspective 2) in preparation for transplant since want to know if she can tolerate tacrolimus given intolerance of multiple other meds     Otherwise, she denies new constitutional symptoms such as anorexia, weight loss, night sweats or unexplained fevers.  Furthermore, she denies recurrent or unexplained intercurrent infections, dyspnea on exertion, lightheadedness, palpitations or chest pain.  There have been no new or unexplained pains or self-identified masses, swelling or enlarged lymph nodes.    Past Medical, Surgical and Family History were reviewed and pertinent updates were made in the Electronic Medical Record    Review of Systems:  Other than as reported above in the interim history, the balance of a full 12-system review was performed and unremarkable.    ECOG Performance Status: 1    Past Medical History:  Past Medical History:   Diagnosis Date   ??? Anxiety    ??? Depression    ??? Hypothyroid        Medications:    Current Outpatient Medications   Medication Sig Dispense Refill   ??? acetaminophen (TYLENOL) 325 MG tablet Take 650 mg by mouth every six (6) hours as needed for pain.     ??? ALPRAZolam (XANAX) 0.25 MG tablet daily as needed.      ??? deferasirox (EXJADE) 500 MG disintegrating tablet Take 1,000 mg by mouth.     ??? eltrombopag (PROMACTA) 50 MG tablet Take 150 mg by mouth.     ??? folic acid (FOLVITE) 1 MG tablet Take 1 mg by mouth.     ??? levoFLOXacin (LEVAQUIN) 500 MG tablet Take 1 tablet (500 mg total) by mouth daily. 30 tablet 2   ??? levonorgestrel (MIRENA) 20 mcg/24 hours (5 yrs) 52 mg IUD 1 each by Intrauterine route.     ??? levothyroxine (SYNTHROID) 100 MCG tablet Take 100 mcg by mouth daily.      ??? lithium (ESKALITH CR) 450 MG ER tablet Take 900 mg by mouth.     ??? lithium (LITHOBID) 300  MG ER tablet Take 600 mg by mouth at bedtime.     ??? LORazepam (ATIVAN) 0.5 MG tablet TAKE 1 TABLET TWICE A DAY ( PRIOR TO CYCLOSPRORINE)     ??? omeprazole (PRILOSEC) 20 MG capsule Take 20 mg by mouth daily.     ??? ondansetron (ZOFRAN-ODT) 8 MG disintegrating tablet Take 8 mg by mouth every eight (8) hours as needed for nausea.     ??? oxyCODONE-acetaminophen (PERCOCET) 5-325 mg per tablet Take 1 tablet by mouth every eight (8) hours as needed.     ??? posaconazole (NOXAFIL) 100 mg TbEC delayed released tablet Take 300 mg by mouth daily. 90 tablet 2   ??? prochlorperazine (COMPAZINE) 5 MG tablet Take 5 mg by mouth.     ??? SUMAtriptan (IMITREX) 20 mg/actuation nasal spray USE 1 SPRAY IN THE NOSE DAILY AS NEEDED FOR MIGRAINE     ??? tacrolimus (PROGRAF) 1 MG capsule Take 1 capsule (1 mg total) by mouth two (2) times a day. 60 capsule 2   ??? TRINTELLIX 20 mg tablet Take 20 mg by mouth daily.      ??? valACYclovir (VALTREX) 500 MG tablet Take 500 mg by mouth Two (2) times a day.      ??? zolpidem (AMBIEN) 10 mg tablet 10 mg.        No current facility-administered medications for this visit.        Vital Signs:  BSA: There is no height or weight on file to calculate BSA.  There were no vitals filed for this visit.   PHONE VISIT      Physical Exam:  LIMITED EXAM DUE TO ATTEMPT TO LIMIT CONTACT DUE TO CORONAVIRUS IN THE COMMUNITY     GENERAL:  The patient sounds well and is in no acute distress.  RESP: Breathing comfortably and unlabored speech.    NEURO: A&Ox4  PSYCH: Normal affect      Relevant Laboratory, radiology and pathology results:  Outside labs reviewed

## 2019-07-08 NOTE — Telephone Encounter (Signed)
Scheduled per los. Called and spoke with patient. Confirmed appts  

## 2019-07-08 NOTE — Telephone Encounter (Signed)
Have decided to change her from cyclosporin to tacrolimus 1 mg bid. Should receive drug from [facility] in couple days. Will need to do weekly levels, this Friday is too soon. Suggest it being done on 12/24 or 12/28. Recommend she stay on the posaconazole so levels won't fluctuate.

## 2019-07-09 ENCOUNTER — Encounter: Admit: 2019-07-09 | Discharge: 2019-07-09 | Payer: PRIVATE HEALTH INSURANCE

## 2019-07-09 ENCOUNTER — Encounter: Payer: Self-pay | Admitting: *Deleted

## 2019-07-09 DIAGNOSIS — C9110 Chronic lymphocytic leukemia of B-cell type not having achieved remission: Secondary | ICD-10-CM | POA: Diagnosis not present

## 2019-07-09 DIAGNOSIS — Z005 Encounter for examination of potential donor of organ and tissue: Secondary | ICD-10-CM | POA: Diagnosis not present

## 2019-07-09 LAB — CYCLOSPORINE: Cyclosporine, LabCorp: 265 ng/mL (ref 100–400)

## 2019-07-09 MED FILL — TACROLIMUS 1 MG CAPSULE: 30 days supply | Qty: 60 | Fill #0 | Status: AC

## 2019-07-09 NOTE — Unmapped (Signed)
Pharmacy Telephone Visit:    This patient visit was completed through the use of an audio/video or telephone encounter.      This patient encounter is appropriate and reasonable under the circumstances given the patient's particular presentation at this time. The patient has been advised of the potential risks and limitations of this mode of treatment (including, but not limited to, the absence of in-person examination) and has agreed to be treated in a remote fashion in spite of them. Any and all of the patient's/patient's family's questions on this issue have been answered.      The patient has also been advised to contact this office for worsening conditions or problems, and seek emergency medical treatment and/or call 911 if the patient deems either necessary.  ____________________-     Susan Kane is a 58 y.o. female with Aplastic Anemia who I am seeing in clinic today for medication management/initiation and education of tacrolimus    Encounter Date: 07/08/2019    Current Treatment: cyclosporine 175 mg BID (with posa) - OFF; starting tacrolimus 1 mg BID (with posa) upon receipt  - on exjade 1000 mg daily and promacta 150 mg daily     For oral chemotherapy:  Pharmacy: [facility]   Medication Access: $10/month - tacrolimus  (Other medications being prescribed/filled/managed locally)    Interval History: See Dr. Lonni Fix note today for details related to her visit, ROS, etc. I completed med rec. Patient's nausea on CSA had somewhat improved with careful dosing of antiemetics. Her recent CSA level was 998, and can see previous note describing that patient took AM dose prior to having this drawn. Patient does report slight tremor and CMP notable for increased SCr. Patient was told to stop CSA due to unknown level, possible toxicity, and imminent switching.     Oncologic History:  Oncology History Overview Note   CLL: Summary as per Dr. Kalman Drape most recent note with minor edits/additions from my review of records    1.??CLL?????diagnosed in August 2010, flow cytometry consistent with CLL  ?? Enlarged left??inguinal lymph node January 2019,??small??neck/axillary nodes and palpable splenomegaly 09/11/2017  ?? CTs??on 09/17/2017-3 cm necrotic appearing lymph node in the left inguinal region, borderline enlarged pelvic/retroperitoneal, chest, and axillary nodes. ??Mild splenomegaly.  ?? Ultrasound-guided biopsy of the left inguinal lymph node 09/18/2017, slightly purulent fluid aspirated, core biopsy is consistent with an atypical lymphoid proliferation???extensive necrosis with surrounding epithelioid histiocytes, limited intact lymphoid tissue involved with CLL  ?? Incisional biopsy of a necrotic/purulent left inguinal lymph node on 10/01/2017???extensive necrosis with granulomatous inflammation, small amount of viable lymphoid tissue involved with CLL, AFB and fungal stains negative  ?? Peripheral blood FISH analysis 02/05/2018??? +deletion 13q14, no evidence of p53 (17p13) deletion, no evidence of 11q22??deletion  - normal karyotype (46,XX)  ?? Bone marrow biopsy 02/26/2018???hypercellular marrow with extensive involvement by CLL, lymphocytes represent??85% of all cells  ?? Ibrutinib initiated 04/03/2018  ?? Ibrutinib placed on hold 04/11/2018 due to onset of arthralgias  ?? Ibrutinib resumed 04/16/2018, discontinued 04/25/2018 secondary to severe arthralgias/arthritis  ?? Ibrutinib resumed at a dose of 140 mg daily 05/03/2018  ?? Ibrutinib dose adjusted to 140 mg alternating with 280 mg 06/25/2018  ?? 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 ??Augmentin prescribed. 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 DAT negative, bilirubin and LDH normal June 2019, progressive symptomatic anemia 04/01/2018,  red cell transfusions 04/01/2018,??04/30/2018,??05/28/2018, 06/17/2018, and 07/05/2018  7.????Hypogammaglobulinemia    Baseline BM bx reviewed at [facility]  Final Diagnosis   (Outside Case #:  [id number], dated 02/26/2018)  Bone marrow, aspiration and biopsy  -  Hypercellular bone marrow (80%) with extensive involvement by chronic lymphocytic leukemia (87% lymphocytes by manual aspirate differential)  (See Comment)  -  By outside report, cytogenetic results are normal     Karyotype: 75, XX and FISH with 13q del but no 11q or 17p del    Repeat BM bx: (done after being off ibrutinib x1 month)  80% involvement by CLL    08/13/18: Presents to [facility] to discuss plan of care     Chronic lymphocytic leukemia (CLL), B-cell (CMS-HCC)   07/12/2018 Initial Diagnosis    Chronic lymphocytic leukemia (CLL), B-cell (CMS-HCC)     08/26/2018 -  Chemotherapy    Acalabrutinib started (approximate date)     11/14/2018 -  Chemotherapy    Acalabrutinib discontinued due to progressive bone marrow failure     12/06/2018 -  Chemotherapy    Ritux x1 given due to CLL still making up majority of BM cellularity (though in the setting of near aplasia of rest of TLH)     03/19/2019 -  Chemotherapy    Equine ATG / CsA for aplastic anemia (suspect 2/t acalabrutinib)         Weight and Vitals:  Wt Readings from Last 3 Encounters:   06/10/19 63 kg (139 lb)   04/03/19 63.5 kg (139 lb 15.9 oz)   04/03/19 63.5 kg (139 lb 15.9 oz)     Temp Readings from Last 3 Encounters:   06/10/19 36.8 ??C (98.2 ??F) (Oral)   04/03/19 36.8 ??C (98.2 ??F) (Oral)   03/22/19 36.6 ??C (97.9 ??F) (Oral)     BP Readings from Last 3 Encounters:   06/10/19 106/66   04/03/19 114/70   03/22/19 146/73     Pulse Readings from Last 3 Encounters:   06/10/19 97   04/03/19 81   03/22/19 (!) 47       Pertinent Labs:  No visits with results within 1 Day(s) from this visit. Latest known visit with results is:   Telemedicine on 06/10/2019   Component Date Value Ref Range Status   ??? ABO Grouping 06/10/2019 O POS   Final   ??? Antibody Screen 06/10/2019 NEG   Final   ??? CMV IGG 06/10/2019 Negative  Negative Final   ??? SBT Comment 06/10/2019 INITIAL BLOOD   Final   ??? HLA-A* #1 06/10/2019 02:01   Final   ??? HLA-A* #2 06/10/2019 02:01   Final   ??? HLA-B* #1 06/10/2019 40:01   Final   ??? HLA-B* #2 06/10/2019 40:01   Final   ??? HLA-C* #1 06/10/2019 03:04   Final   ??? HLA-C* #2 06/10/2019 03:04   Final   ??? HLA-DRB1* #1 06/10/2019 08:01   Final   ??? HLA-DRB1* #2 06/10/2019 04:04   Final   ??? HLA-DQB1* #1 06/10/2019 03:02   Final   ??? HLA-DQB1* #2 06/10/2019 04:02   Final   ??? HLA-DPB1* #1 06/10/2019 04:01P   Final   ??? HLA-DPB1* #2 06/10/2019 06:01P   Final    Comment: All procedures and reagents have been validated and performance characteristics determined by the [facility]. Certain of these tests have not been cleared/approved by the U.S. Food and Drug Administration (FDA). The FDA has determined   that such approval/clearance is not necessary  because this laboratory is certified under the Clinical Laboratory Improvement Amendments to perform high complexity testing. This test is used for clinical purposes. It should not be regarded as   investigational or for research. All HLA typings performed using molecular methodologies. Details of test procedures may be obtained by calling [phone].  The P indicates the presence of additional possible alleles with identical antigen recognition domains. These alleles are considered identical to the specified allele for HLA matching purposes (see Cherre Huger, et al. Tissue Antigens. 2013; 81:194).   Non-expressed, null alleles are excluded from this designation.  A listing of alleles within identical antigen binding domains can be found on the [facility] website ([email or web address]) under the HLA alleles with identical binding domains reference link.   ??? HLA Antibody Screen 06/10/2019 Save for future testing   Final    All procedures and reagents have been validated and performance characteristics determined by the [facility]. Certain of these tests have not been cleared/approved by the U.S. Food and Drug Administration (FDA). The FDA has determined   that such approval/clearance is not necessary because this laboratory is certified under the Clinical Laboratory Improvement Amendments to perform high complexity testing. This test is used for clinical purposes. It should not be regarded as   investigational or for research. All HLA typings performed using molecular methodologies. Details of test procedures may be obtained by calling [phone]. HLA-A, B, C, DR, and DQ results are serological equivalents of the molecular type.       Allergies: No Known Allergies    Drug Interactions:   - posaconazole increases concentration of tacrolimus (tac dose empirically reduced ~50%)  - Exjade may induce 3A4 substrates - so can lower tacrolimus level, however to an unknown extent so decision to conservatively reduce based on DDI with posa and monitor      Current Medications:  Current Outpatient Medications   Medication Sig Dispense Refill   ??? acetaminophen (TYLENOL) 325 MG tablet Take 650 mg by mouth every six (6) hours as needed for pain.     ??? ALPRAZolam (XANAX) 0.25 MG tablet 0.25 mg daily as needed.      ??? deferasirox (EXJADE) 500 MG disintegrating tablet Take 1,000 mg by mouth.     ??? eltrombopag (PROMACTA) 50 MG tablet Take 150 mg by mouth.     ??? folic acid (FOLVITE) 1 MG tablet Take 1 mg by mouth daily.      ??? levoFLOXacin (LEVAQUIN) 500 MG tablet Take 1 tablet (500 mg total) by mouth daily. 30 tablet 2 ??? levonorgestrel (MIRENA) 20 mcg/24 hours (5 yrs) 52 mg IUD 1 each by Intrauterine route.     ??? levothyroxine (SYNTHROID) 100 MCG tablet Take 100 mcg by mouth daily.      ??? lithium (LITHOBID) 300 MG ER tablet Take 600 mg by mouth at bedtime.     ??? ondansetron (ZOFRAN-ODT) 8 MG disintegrating tablet Take 8 mg by mouth every eight (8) hours as needed for nausea.     ??? posaconazole (NOXAFIL) 100 mg TbEC delayed released tablet Take 300 mg by mouth daily. 90 tablet 2   ??? prochlorperazine (COMPAZINE) 5 MG tablet Take 5 mg by mouth every six (6) hours as needed.      ??? sulfamethoxazole-trimethoprim (BACTRIM DS) 800-160 mg per tablet Take 1 tablet (160 mg of trimethoprim total) by mouth 2 times a day on Saturday, Sunday. 16 tablet 11   ??? SUMAtriptan (IMITREX) 20 mg/actuation nasal spray USE 1 SPRAY  IN THE NOSE DAILY AS NEEDED FOR MIGRAINE     ??? TRINTELLIX 20 mg tablet Take 20 mg by mouth daily.      ??? valACYclovir (VALTREX) 500 MG tablet Take 500 mg by mouth daily.      ??? zolpidem (AMBIEN) 10 mg tablet Take 10 mg by mouth nightly.      ??? tacrolimus (PROGRAF) 1 MG capsule Take 1 capsule (1 mg total) by mouth two (2) times a day. 60 capsule 2     No current facility-administered medications for this visit.        Assessment: Susan Kane is a 58 y.o. female with Aplastic Anemia being started on tacrolimus 1 mg BID.    Plan:   - Patient will STOP cyclosporine. Can hang onto it in case we have to revisit this medication in the future.  - Once tacrolimus is received in next couple days, should START 1 mg BID. Can take with or without food.   - Starting end of next week (~12/25) or around then due to the holiday, should continue getting weekly drug levels. Goal 5-15.  - Side effects that are most common include headache, tremor, elevated SCr, hyperK, high BG. Keeping in range should minimize these effects. - Patient should contact [facility] pharmacy each month for refill. Can fill elsewhere, however for ease of management, patient and I agreed we will start with [facility]  - Continue Exjade and Promacta per local MD.   - I also recommended consistent and continued use of posaconazole as her tac levels will be highly impacted by starting/stopping this. Could consider stopping posa if ANC remains > 1.0-1.5 with subsequent empiric increase to tacrolimus dose, and re-initiation of fluconazole since this will be more in line with what they will use in ppx setting post-BMT. Can continue other ppx with levaquin while ANC <0.5, and valtrex/bactrim should continue indefinitely.     F/u:  Future Appointments   Date Time Provider Department Center   07/29/2019  9:00 AM [facility] PFT 2 [id number]   07/29/2019 10:30 AM C[facility] EKG [id number]   07/29/2019 10:45 AM [facility] ECHO OUTPATIENT 2 IECH[facility] [place]   07/29/2019 11:30 AM [facility] DIAG RM 10 [facility]    07/29/2019 12:00 PM Verdene Lennert Myer, LCSW [facility]   07/29/2019  1:45 PM ONCBMT LABS [facility] [facility]   07/29/2019  2:00 PM ONCBMT NURSE [facility]   07/29/2019  2:30 PM Janell Markey [facility]   07/29/2019  3:30 PM Pernell Dupre, MD [id number]       I spent 30 minutes with Susan Kane on the phone.       Manfred Arch, PharmD, BCOP, CPP  Pager: [phone]

## 2019-07-09 NOTE — Unmapped (Signed)
[facility]   Patient Onboarding/Medication Counseling    Susan Kane is a 58 y.o. female with aplastic anemia who I am counseling today on initiation of therapy.  I am speaking to the patient.    Was a Nurse, learning disability used for this call? No    Verified patient's date of birth / HIPAA.    Specialty medication(s) to be sent: Hematology/Oncology: Tacrolimus       Non-specialty medications/supplies to be sent: n/a      Medications not needed at this time: n/a       Prograf (tacrolimus)    Medication & Administration     Dosage: Take 1 capsule (1 mg total) by mouth two (2) times a day.    Administration:   ? May take with or without food  ? Take 12 hours apart    Adherence/Missed dose instructions:  ? Take a missed dose as soon as you think about it.  ? If it is close to the time for your next dose, skip the missed dose and go back to your normal time.  ? Do not take 2 doses at the same time or extra doses.    Goals of Therapy     ? To prevent organ rejection    Side Effects & Monitoring Parameters     ? Common side effects  ? Dizziness  ? Fatigue  ? Headache  ? Stuffy nose or sore throat  ? Nausea, vomiting, stomach pain, diarrhea, constipation  ? Heartburn  ? Back or joint pain  ? Increased risk of infection    ? The following side effects should be reported to the provider:  ? Allergic reaction  ? Kidney issues (change in quantity or urine passed, blood in urine, or weight gain)  ? High blood pressure (dizziness, change in eyesight, headache)  ? Electrolyte issues (change in mood, confusion, muscle pain, or weakness)  ? Abnormal breathing  ? Shakiness  ? Unexplained bleeding or bruising (gums bleeding, blood in urine, nosebleeds, any abnormal bleeding)  ? Signs of infection  ? Skin changes (sores, paleness, new or changed bumps or moles)    ? Monitoring Parameters  ? Renal function  ? Liver function  ? Glucose levels  ? Blood pressure  ? Tacrolimus trough levels ? Cardiac monitoring (for QT prolongation)      Contraindications, Warnings, & Precautions     ? Black Box Warning: Infections - immunosuppressant agents increase the risk of infection that may lead to hospitalization or death  ? Black Box Warning: Malignancy - immunosuppressant agents may be associated with the development of malignancies that may lead to hospitalization or death  ? Limit or avoid sun and ultraviolet light exposure, use appropriate sun protection  ? Myocardial hypertrophy -avoid use in patients with congenital long QT syndrome  ? Diabetes mellitus - the risk for new-onset diabetes and insulin-dependent post-transplant diabetes mellitus is increased with tacrolimus use after transplantation  ? GI perforation  ? Hyperkalemia  ? Hypertension  ? Nephrotoxicity  ? Neurotoxicity  ? This is a narrow therapeutic index drug. Do not switch manufacturers without first talking to the provider.    Drug/Food Interactions     ? Medication list reviewed in Epic. The patient was instructed to inform the care team before taking any new medications or supplements. - posaconazole increases concentration of tacrolimus (tac dose empirically reduced ~50%). Exjade may induce 3A4 substrates - so can lower tacrolimus level, however to an unknown extent so decision  to conservatively reduce based on DDI with posa and monitor.   ? Avoid alcohol  ? Avoid grapefruit or grapefruit juice  ? Avoid live vaccines    Storage, Handling Precautions, & Disposal     ? Store at room temperature  ? Keep away from children and pets      Current Medications (including OTC/herbals), Comorbidities and Allergies     Current Outpatient Medications   Medication Sig Dispense Refill   ??? acetaminophen (TYLENOL) 325 MG tablet Take 650 mg by mouth every six (6) hours as needed for pain.     ??? ALPRAZolam (XANAX) 0.25 MG tablet 0.25 mg daily as needed.      ??? deferasirox (EXJADE) 500 MG disintegrating tablet Take 1,000 mg by mouth. ??? eltrombopag (PROMACTA) 50 MG tablet Take 150 mg by mouth.     ??? folic acid (FOLVITE) 1 MG tablet Take 1 mg by mouth daily.      ??? levoFLOXacin (LEVAQUIN) 500 MG tablet Take 1 tablet (500 mg total) by mouth daily. 30 tablet 2   ??? levonorgestrel (MIRENA) 20 mcg/24 hours (5 yrs) 52 mg IUD 1 each by Intrauterine route.     ??? levothyroxine (SYNTHROID) 100 MCG tablet Take 100 mcg by mouth daily.      ??? lithium (LITHOBID) 300 MG ER tablet Take 600 mg by mouth at bedtime.     ??? ondansetron (ZOFRAN-ODT) 8 MG disintegrating tablet Take 8 mg by mouth every eight (8) hours as needed for nausea.     ??? posaconazole (NOXAFIL) 100 mg TbEC delayed released tablet Take 300 mg by mouth daily. 90 tablet 2   ??? prochlorperazine (COMPAZINE) 5 MG tablet Take 5 mg by mouth every six (6) hours as needed.      ??? SUMAtriptan (IMITREX) 20 mg/actuation nasal spray USE 1 SPRAY IN THE NOSE DAILY AS NEEDED FOR MIGRAINE     ??? tacrolimus (PROGRAF) 1 MG capsule Take 1 capsule (1 mg total) by mouth two (2) times a day. 60 capsule 2   ??? TRINTELLIX 20 mg tablet Take 20 mg by mouth daily.      ??? valACYclovir (VALTREX) 500 MG tablet Take 500 mg by mouth daily.      ??? zolpidem (AMBIEN) 10 mg tablet Take 10 mg by mouth nightly.        No current facility-administered medications for this visit.        No Known Allergies    Patient Active Problem List   Diagnosis   ??? Chronic lymphocytic leukemia (CLL), B-cell (CMS-HCC)   ??? Hypogammaglobulinemia (CMS-HCC)   ??? Chronic lymphocytic leukemia of B-cell type not having achieved remission (CMS-HCC)   ??? Aplastic anemia (CMS-HCC)   ??? Depression   ??? Lower abdominal pain   ??? Drug-induced nausea and vomiting       Reviewed and up to date in Epic.    Appropriateness of Therapy     Is medication and dose appropriate based on diagnosis? Yes    Prescription has been clinically reviewed: Yes    Baseline Quality of Life Assessment How many days over the past month did your condition keep you from your normal activities? 0    Financial Information     Medication Assistance provided: None Required    Anticipated copay of $10 reviewed with patient. Verified delivery address.    Delivery Information     Scheduled delivery date: 07/10/19    Expected start date: 07/10/19    Medication will be delivered  via UPS to the prescription address in [facility].  This shipment will not require a signature.      Explained the services we provide at [facility] and that each month we would call to set up refills.  Stressed importance of returning phone calls so that we could ensure they receive their medications in time each month.  Informed patient that we should be setting up refills 7-10 days prior to when they will run out of medication.  A pharmacist will reach out to perform a clinical assessment periodically.  Informed patient that a welcome packet and a drug information handout will be sent.      Patient verbalized understanding of the above information as well as how to contact the pharmacy at [phone] option 4 with any questions/concerns.  The pharmacy is open Monday through Friday 8:30am-4:30pm.  A pharmacist is available 24/7 via pager to answer any clinical questions they may have.    Patient Specific Needs     ? Does the patient have any physical, cognitive, or cultural barriers? No    ? Patient prefers to have medications discussed with  Patient     ? Is the patient or caregiver able to read and understand education materials at a high school level or above? Yes    ? Patient's primary language is  English     ? Is the patient high risk? No     ? Does the patient require a Care Management Plan? No     ? Does the patient require physician intervention or other additional services (i.e. nutrition, smoking cessation, social work)? No      Dena Esperanza  [name]  [facility] Specialty Pharmacist

## 2019-07-09 NOTE — Progress Notes (Signed)
Faxed cyclosporin result of 07/08/19 to [facility] [phone]

## 2019-07-10 ENCOUNTER — Encounter: Admit: 2019-07-10 | Discharge: 2019-07-10 | Payer: PRIVATE HEALTH INSURANCE

## 2019-07-10 DIAGNOSIS — C9110 Chronic lymphocytic leukemia of B-cell type not having achieved remission: Secondary | ICD-10-CM | POA: Diagnosis not present

## 2019-07-10 DIAGNOSIS — Z005 Encounter for examination of potential donor of organ and tissue: Secondary | ICD-10-CM | POA: Diagnosis not present

## 2019-07-11 ENCOUNTER — Encounter: Admit: 2019-07-11 | Discharge: 2019-07-11 | Payer: PRIVATE HEALTH INSURANCE

## 2019-07-11 ENCOUNTER — Telehealth: Payer: Self-pay | Admitting: *Deleted

## 2019-07-11 ENCOUNTER — Inpatient Hospital Stay: Payer: BC Managed Care – PPO

## 2019-07-11 ENCOUNTER — Other Ambulatory Visit: Payer: Self-pay

## 2019-07-11 DIAGNOSIS — Z95828 Presence of other vascular implants and grafts: Secondary | ICD-10-CM

## 2019-07-11 DIAGNOSIS — D61818 Other pancytopenia: Secondary | ICD-10-CM | POA: Diagnosis not present

## 2019-07-11 DIAGNOSIS — C9110 Chronic lymphocytic leukemia of B-cell type not having achieved remission: Secondary | ICD-10-CM

## 2019-07-11 DIAGNOSIS — E039 Hypothyroidism, unspecified: Secondary | ICD-10-CM | POA: Diagnosis not present

## 2019-07-11 DIAGNOSIS — D801 Nonfamilial hypogammaglobulinemia: Secondary | ICD-10-CM | POA: Diagnosis not present

## 2019-07-11 DIAGNOSIS — Z79899 Other long term (current) drug therapy: Secondary | ICD-10-CM | POA: Diagnosis not present

## 2019-07-11 DIAGNOSIS — Z005 Encounter for examination of potential donor of organ and tissue: Secondary | ICD-10-CM | POA: Diagnosis not present

## 2019-07-11 DIAGNOSIS — D696 Thrombocytopenia, unspecified: Secondary | ICD-10-CM

## 2019-07-11 DIAGNOSIS — M25519 Pain in unspecified shoulder: Secondary | ICD-10-CM | POA: Diagnosis not present

## 2019-07-11 DIAGNOSIS — R5381 Other malaise: Secondary | ICD-10-CM | POA: Diagnosis not present

## 2019-07-11 DIAGNOSIS — R112 Nausea with vomiting, unspecified: Secondary | ICD-10-CM | POA: Diagnosis not present

## 2019-07-11 DIAGNOSIS — N939 Abnormal uterine and vaginal bleeding, unspecified: Secondary | ICD-10-CM | POA: Diagnosis not present

## 2019-07-11 LAB — CBC WITH DIFFERENTIAL (CANCER CENTER ONLY)
Abs Immature Granulocytes: 0.03 10*3/uL (ref 0.00–0.07)
Basophils Absolute: 0 10*3/uL (ref 0.0–0.1)
Basophils Relative: 0 %
Eosinophils Absolute: 0 10*3/uL (ref 0.0–0.5)
Eosinophils Relative: 1 %
HCT: 26.4 % — ABNORMAL LOW (ref 36.0–46.0)
Hemoglobin: 9 g/dL — ABNORMAL LOW (ref 12.0–15.0)
Immature Granulocytes: 2 %
Lymphocytes Relative: 73 %
Lymphs Abs: 1.1 10*3/uL (ref 0.7–4.0)
MCH: 29.8 pg (ref 26.0–34.0)
MCHC: 34.1 g/dL (ref 30.0–36.0)
MCV: 87.4 fL (ref 80.0–100.0)
Monocytes Absolute: 0.2 10*3/uL (ref 0.1–1.0)
Monocytes Relative: 13 %
Neutro Abs: 0.2 10*3/uL — CL (ref 1.7–7.7)
Neutrophils Relative %: 11 %
Platelet Count: 17 10*3/uL — ABNORMAL LOW (ref 150–400)
RBC: 3.02 MIL/uL — ABNORMAL LOW (ref 3.87–5.11)
RDW: 14 % (ref 11.5–15.5)
WBC Count: 1.3 10*3/uL — ABNORMAL LOW (ref 4.0–10.5)
nRBC: 0 % (ref 0.0–0.2)

## 2019-07-11 LAB — CMP (CANCER CENTER ONLY)
ALT: 6 U/L (ref 0–44)
AST: 16 U/L (ref 15–41)
Albumin: 3.7 g/dL (ref 3.5–5.0)
Alkaline Phosphatase: 93 U/L (ref 38–126)
Anion gap: 8 (ref 5–15)
BUN: 12 mg/dL (ref 6–20)
CO2: 27 mmol/L (ref 22–32)
Calcium: 9.7 mg/dL (ref 8.9–10.3)
Chloride: 105 mmol/L (ref 98–111)
Creatinine: 1.13 mg/dL — ABNORMAL HIGH (ref 0.44–1.00)
GFR, Est AFR Am: >60 mL/min (ref >60)
GFR, Estimated: 53 mL/min — ABNORMAL LOW (ref >60)
Glucose, Bld: 124 mg/dL — ABNORMAL HIGH (ref 70–99)
Potassium: 4.5 mmol/L (ref 3.5–5.1)
Sodium: 140 mmol/L (ref 135–145)
Total Bilirubin: 0.8 mg/dL (ref 0.3–1.2)
Total Protein: 6 g/dL — ABNORMAL LOW (ref 6.5–8.1)

## 2019-07-11 LAB — SAMPLE TO BLOOD BANK

## 2019-07-11 MED ORDER — HEPARIN SOD (PORK) LOCK FLUSH 100 UNIT/ML IV SOLN
500.0000 [IU] | Freq: Once | INTRAVENOUS | Status: AC | PRN
  Administered 2019-07-11: 500 [IU]
  Filled 2019-07-11: qty 5

## 2019-07-11 MED ORDER — SODIUM CHLORIDE 0.9% FLUSH
10.0000 mL | INTRAVENOUS | Status: DC | PRN
  Administered 2019-07-11: 10 mL
  Filled 2019-07-11: qty 10

## 2019-07-11 NOTE — Telephone Encounter (Signed)
CRITICAL VALUE STICKER  CRITICAL VALUE: ANC 0.2   RECEIVER (on-site recipient of call): Manuela Schwartz, [place] NOTIFIED: 07/11/2019 @ 12:53  MESSENGER (representative from lab): Pam  MD NOTIFIED: Dr. Benay Spice  TIME OF NOTIFICATION: 12:55 RESPONSE: F/U as scheduled.

## 2019-07-11 NOTE — Telephone Encounter (Signed)
Patient reports no bleeding, so per Dr. Benay Spice, no platelets are needed today. Started Tacrolimus today. She will follow up on 12/21 as scheduled.

## 2019-07-12 ENCOUNTER — Inpatient Hospital Stay: Payer: BC Managed Care – PPO

## 2019-07-12 VITALS — BP 104/73 | HR 71 | Temp 99.1°F | Resp 20

## 2019-07-12 DIAGNOSIS — M25519 Pain in unspecified shoulder: Secondary | ICD-10-CM | POA: Diagnosis not present

## 2019-07-12 DIAGNOSIS — D61818 Other pancytopenia: Secondary | ICD-10-CM | POA: Diagnosis not present

## 2019-07-12 DIAGNOSIS — Z95828 Presence of other vascular implants and grafts: Secondary | ICD-10-CM

## 2019-07-12 DIAGNOSIS — N939 Abnormal uterine and vaginal bleeding, unspecified: Secondary | ICD-10-CM | POA: Diagnosis not present

## 2019-07-12 DIAGNOSIS — E039 Hypothyroidism, unspecified: Secondary | ICD-10-CM | POA: Diagnosis not present

## 2019-07-12 DIAGNOSIS — D801 Nonfamilial hypogammaglobulinemia: Secondary | ICD-10-CM | POA: Diagnosis not present

## 2019-07-12 DIAGNOSIS — D696 Thrombocytopenia, unspecified: Secondary | ICD-10-CM

## 2019-07-12 DIAGNOSIS — R5381 Other malaise: Secondary | ICD-10-CM | POA: Diagnosis not present

## 2019-07-12 DIAGNOSIS — C9110 Chronic lymphocytic leukemia of B-cell type not having achieved remission: Secondary | ICD-10-CM | POA: Diagnosis not present

## 2019-07-12 DIAGNOSIS — R112 Nausea with vomiting, unspecified: Secondary | ICD-10-CM | POA: Diagnosis not present

## 2019-07-12 DIAGNOSIS — Z79899 Other long term (current) drug therapy: Secondary | ICD-10-CM | POA: Diagnosis not present

## 2019-07-12 MED ORDER — SODIUM CHLORIDE 0.9% IV SOLUTION
250.0000 mL | Freq: Once | INTRAVENOUS | Status: AC
  Administered 2019-07-12: 250 mL via INTRAVENOUS
  Filled 2019-07-12: qty 250

## 2019-07-12 MED ORDER — HEPARIN SOD (PORK) LOCK FLUSH 100 UNIT/ML IV SOLN
500.0000 [IU] | Freq: Once | INTRAVENOUS | Status: AC | PRN
  Administered 2019-07-12: 250 [IU]
  Filled 2019-07-12: qty 5

## 2019-07-12 MED ORDER — SODIUM CHLORIDE 0.9% FLUSH
10.0000 mL | INTRAVENOUS | Status: AC | PRN
  Administered 2019-07-12: 10 mL
  Filled 2019-07-12: qty 10

## 2019-07-12 NOTE — Patient Instructions (Signed)

## 2019-07-13 LAB — BPAM PLATELET PHERESIS
Blood Product Expiration Date: 202012202359
ISSUE DATE / TIME: 202012191300
Unit Type and Rh: 6200

## 2019-07-13 LAB — PREPARE PLATELET PHERESIS: Unit division: 0

## 2019-07-14 ENCOUNTER — Other Ambulatory Visit: Payer: Self-pay

## 2019-07-14 ENCOUNTER — Inpatient Hospital Stay: Payer: BC Managed Care – PPO

## 2019-07-14 ENCOUNTER — Inpatient Hospital Stay (HOSPITAL_BASED_OUTPATIENT_CLINIC_OR_DEPARTMENT_OTHER): Payer: BC Managed Care – PPO | Admitting: Oncology

## 2019-07-14 ENCOUNTER — Other Ambulatory Visit: Payer: Self-pay | Admitting: *Deleted

## 2019-07-14 VITALS — BP 104/68 | HR 90 | Temp 98.0°F | Resp 16 | Ht 66.0 in | Wt 128.9 lb

## 2019-07-14 DIAGNOSIS — D696 Thrombocytopenia, unspecified: Secondary | ICD-10-CM

## 2019-07-14 DIAGNOSIS — Z95828 Presence of other vascular implants and grafts: Secondary | ICD-10-CM

## 2019-07-14 DIAGNOSIS — C9110 Chronic lymphocytic leukemia of B-cell type not having achieved remission: Secondary | ICD-10-CM

## 2019-07-14 DIAGNOSIS — N939 Abnormal uterine and vaginal bleeding, unspecified: Secondary | ICD-10-CM | POA: Diagnosis not present

## 2019-07-14 DIAGNOSIS — R5381 Other malaise: Secondary | ICD-10-CM | POA: Diagnosis not present

## 2019-07-14 DIAGNOSIS — E039 Hypothyroidism, unspecified: Secondary | ICD-10-CM | POA: Diagnosis not present

## 2019-07-14 DIAGNOSIS — D61818 Other pancytopenia: Secondary | ICD-10-CM | POA: Diagnosis not present

## 2019-07-14 DIAGNOSIS — D801 Nonfamilial hypogammaglobulinemia: Secondary | ICD-10-CM | POA: Diagnosis not present

## 2019-07-14 DIAGNOSIS — R112 Nausea with vomiting, unspecified: Secondary | ICD-10-CM | POA: Diagnosis not present

## 2019-07-14 DIAGNOSIS — M25519 Pain in unspecified shoulder: Secondary | ICD-10-CM | POA: Diagnosis not present

## 2019-07-14 DIAGNOSIS — Z79899 Other long term (current) drug therapy: Secondary | ICD-10-CM | POA: Diagnosis not present

## 2019-07-14 DIAGNOSIS — Z005 Encounter for examination of potential donor of organ and tissue: Principal | ICD-10-CM

## 2019-07-14 LAB — CBC WITH DIFFERENTIAL (CANCER CENTER ONLY)
Abs Immature Granulocytes: 0.11 10*3/uL — ABNORMAL HIGH (ref 0.00–0.07)
Basophils Absolute: 0 10*3/uL (ref 0.0–0.1)
Basophils Relative: 1 %
Eosinophils Absolute: 0 10*3/uL (ref 0.0–0.5)
Eosinophils Relative: 1 %
HCT: 26.2 % — ABNORMAL LOW (ref 36.0–46.0)
Hemoglobin: 8.8 g/dL — ABNORMAL LOW (ref 12.0–15.0)
Immature Granulocytes: 5 %
Lymphocytes Relative: 80 %
Lymphs Abs: 1.7 10*3/uL (ref 0.7–4.0)
MCH: 29.4 pg (ref 26.0–34.0)
MCHC: 33.6 g/dL (ref 30.0–36.0)
MCV: 87.6 fL (ref 80.0–100.0)
Monocytes Absolute: 0.2 10*3/uL (ref 0.1–1.0)
Monocytes Relative: 8 %
Neutro Abs: 0.1 10*3/uL — CL (ref 1.7–7.7)
Neutrophils Relative %: 5 %
Platelet Count: 29 10*3/uL — ABNORMAL LOW (ref 150–400)
RBC: 2.99 MIL/uL — ABNORMAL LOW (ref 3.87–5.11)
RDW: 14 % (ref 11.5–15.5)
WBC Count: 2.1 10*3/uL — ABNORMAL LOW (ref 4.0–10.5)
nRBC: 0 % (ref 0.0–0.2)

## 2019-07-14 LAB — BASIC METABOLIC PANEL - CANCER CENTER ONLY
Anion gap: 10 (ref 5–15)
BUN: 17 mg/dL (ref 6–20)
CO2: 25 mmol/L (ref 22–32)
Calcium: 9.5 mg/dL (ref 8.9–10.3)
Chloride: 103 mmol/L (ref 98–111)
Creatinine: 1.65 mg/dL — ABNORMAL HIGH (ref 0.44–1.00)
GFR, Est AFR Am: 39 mL/min — ABNORMAL LOW (ref >60)
GFR, Estimated: 34 mL/min — ABNORMAL LOW (ref >60)
Glucose, Bld: 177 mg/dL — ABNORMAL HIGH (ref 70–99)
Potassium: 4 mmol/L (ref 3.5–5.1)
Sodium: 138 mmol/L (ref 135–145)

## 2019-07-14 LAB — SAMPLE TO BLOOD BANK

## 2019-07-14 MED ORDER — SODIUM CHLORIDE 0.9% FLUSH
10.0000 mL | INTRAVENOUS | Status: DC | PRN
  Administered 2019-07-14: 10 mL
  Filled 2019-07-14: qty 10

## 2019-07-14 NOTE — Patient Instructions (Signed)
PICC Home Care Guide ° °A peripherally inserted central catheter (PICC) is a form of IV access that allows medicines and IV fluids to be quickly distributed throughout the body. The PICC is a long, thin, flexible tube (catheter) that is inserted into a vein in the upper arm. The catheter ends in a large vein in the chest (superior vena cava, or SVC). After the PICC is inserted, a chest X-ray may be done to make sure that it is in the correct place. °A PICC may be placed for different reasons, such as: °· To give medicines and liquid nutrition. °· To give IV fluids and blood products. °· If there is trouble placing a peripheral intravenous (PIV) catheter. °If taken care of properly, a PICC can remain in place for several months. Having a PICC can also allow a person to go home from the hospital sooner. Medicine and PICC care can be managed at home by a family member, caregiver, or home health care team. °What are the risks? °Generally, having a PICC is safe. However, problems may occur, including: °· A blood clot (thrombus) forming in or at the tip of the PICC. °· A blood clot forming in a vein (deep vein thrombosis) or traveling to the lung (pulmonary embolism). °· Inflammation of the vein (phlebitis) in which the PICC is placed. °· Infection. Central line associated blood stream infection (CLABSI) is a serious infection that often requires hospitalization. °· PICC movement (malposition). The PICC tip may move from its original position due to excessive physical activity, forceful coughing, sneezing, or vomiting. °· A break or cut in the PICC. It is important not to use scissors near the PICC. °· Nerve or tendon irritation or injury during PICC insertion. °How to take care of your PICC °Preventing problems °· You and any caregivers should wash your hands often with soap. Wash hands: °? Before touching the PICC line or the infusion device. °? Before changing a bandage (dressing). °· Flush the PICC as told by your  health care provider. Let your health care provider know right away if the PICC is hard to flush or does not flush. Do not use force to flush the PICC. °· Do not use a syringe that is less than 10 mL to flush the PICC. °· Avoid blood pressure checks on the arm in which the PICC is placed. °· Never pull or tug on the PICC. °· Do not take the PICC out yourself. Only a trained clinical professional should remove the PICC. °· Use clean and sterile supplies only. Keep the supplies in a dry place. Do not reuse needles, syringes, or any other supplies. Doing that can lead to infection. °· Keep pets and children away from your PICC line. °· Check the PICC insertion site every day for signs of infection. Check for: °? Leakage. °? Redness, swelling, or pain. °? Fluid or blood. °? Warmth. °? Pus or a bad smell. °PICC dressing care °· Keep your PICC bandage (dressing) clean and dry to prevent infection. °· Do not take baths, swim, or use a hot tub until your health care provider approves. Ask your health care provider if you can take showers. You may only be allowed to take sponge baths for bathing. When you are allowed to shower: °? Ask your health care provider to teach you how to wrap the PICC line. °? Cover the PICC line with clear plastic wrap and tape to keep it dry while showering. °· Follow instructions from your health care provider   about how to take care of your insertion site and dressing. Make sure you: °? Wash your hands with soap and water before you change your bandage (dressing). If soap and water are not available, use hand sanitizer. °? Change your dressing as told by your health care provider. °? Leave stitches (sutures), skin glue, or adhesive strips in place. These skin closures may need to stay in place for 2 weeks or longer. If adhesive strip edges start to loosen and curl up, you may trim the loose edges. Do not remove adhesive strips completely unless your health care provider tells you to do  that. °· Change your PICC dressing if it becomes loose or wet. °General instructions ° °· Carry your PICC identification card or wear a medical alert bracelet at all times. °· Keep the tube clamped at all times, unless it is being used. °· Carry a smooth-edge clamp with you at all times to place on the tube if it breaks. °· Do not use scissors or sharp objects near the tube. °· You may bend your arm and move it freely. If your PICC is near or at the bend of your elbow, avoid activity with repeated motion at the elbow. °· Avoid lifting heavy objects as told by your health care provider. °· Keep all follow-up visits as told by your health care provider. This is important. °Disposal of supplies °· Throw away any syringes in a disposal container that is meant for sharp items (sharps container). You can buy a sharps container from a pharmacy, or you can make one by using an empty hard plastic bottle with a cover. °· Place any used dressings or infusion bags into a plastic bag. Throw that bag in the trash. °Contact a health care provider if: °· You have pain in your arm, ear, face, or teeth. °· You have a fever or chills. °· You have redness, swelling, or pain around the insertion site. °· You have fluid or blood coming from the insertion site. °· Your insertion site feels warm to the touch. °· You have pus or a bad smell coming from the insertion site. °· Your skin feels hard and raised around the insertion site. °Get help right away if: °· Your PICC is accidentally pulled all the way out. If this happens, cover the insertion site with a bandage or gauze dressing. Do not throw the PICC away. Your health care provider will need to check it. °· Your PICC was tugged or pulled and has partially come out. Do not  push the PICC back in. °· You cannot flush the PICC, it is hard to flush, or the PICC leaks around the insertion site when it is flushed. °· You hear a "flushing" sound when the PICC is flushed. °· You feel your  heart racing or skipping beats. °· There is a hole or tear in the PICC. °· You have swelling in the arm in which the PICC was inserted. °· You have a red streak going up your arm from where the PICC was inserted. °Summary °· A peripherally inserted central catheter (PICC) is a long, thin, flexible tube (catheter) that is inserted into a vein in the upper arm. °· The PICC is inserted using a sterile technique by a specially trained nurse or physician. Only a trained clinical professional should remove it. °· Keep your PICC identification card with you at all times. °· Avoid blood pressure checks on the arm in which the PICC is placed. °· If cared for   properly, a PICC can remain in place for several months. Having a PICC can also allow a person to go home from the hospital sooner. °This information is not intended to replace advice given to you by your health care provider. Make sure you discuss any questions you have with your health care provider. °Document Released: 01/14/2003 Document Revised: 06/22/2017 Document Reviewed: 08/12/2016 °Elsevier Patient Education © 2020 Elsevier Inc. ° °

## 2019-07-14 NOTE — Progress Notes (Signed)
Meagan Walsh   Diagnosis: CLL, aplastic anemia  INTERVAL HISTORY:   Meagan Walsh returns for a scheduled visit.  She developed vaginal spotting and gross hematuria 07/12/2019.  She received a platelet transfusion.  She continues to have vaginal spotting.  No other bleeding.  No fever or dysuria. She stopped cyclosporine 07/08/2019 and started tacrolimus 3 days ago.  She continues to have nausea and intermittent vomiting.  She has mild bleeding from the gums.  No headache.  She has noted a small lymph node in the right groin.  Meagan Walsh continues to have discomfort with motion at the right greater than left shoulder.   Objective:  Vital signs in last 24 hours:  Blood pressure 104/68, pulse 90, temperature 98 F (36.7 C), temperature source Temporal, resp. rate 16, height _0  (1.676 m), weight 128 lb 14.4 oz (58.5 kg), SpO2 100 %.    HEENT: No thrush, no active bleeding, dried blood over the lips and gums, gum hypertrophy Lymphatics: Pea-sized right inguinal and left axillary node GI: Soft and nontender, no hepatosplenomegaly Vascular: No leg edema  Skin: No petechiae  Portacath/PICC-without erythema  Lab Results:  Lab Results  Component Value Date   WBC 2.1 (L) 07/14/2019   HGB 8.8 (L) 07/14/2019   HCT 26.2 (L) 07/14/2019   MCV 87.6 07/14/2019   PLT 29 (L) 07/14/2019   NEUTROABS 0.1 (LL) 07/14/2019    CMP  Lab Results  Component Value Date   NA 138 07/14/2019   K 4.0 07/14/2019   CL 103 07/14/2019   CO2 25 07/14/2019   GLUCOSE 177 (H) 07/14/2019   BUN 17 07/14/2019   CREATININE 1.65 (H) 07/14/2019   CALCIUM 9.5 07/14/2019   PROT 6.0 (L) 07/11/2019   ALBUMIN 3.7 07/11/2019   AST 16 07/11/2019   ALT 6 07/11/2019   ALKPHOS 93 07/11/2019   BILITOT 0.8 07/11/2019   GFRNONAA 34 (L) 07/14/2019   GFRAA 39 (L) 07/14/2019   Medications: I have reviewed the patient's current medications.   Assessment/Plan: 1. CLL-diagnosed in  August 2010, flow cytometry consistent with CLL Enlarged leftinguinal lymph node January 2019,smallneck/axillary nodes and palpable splenomegaly 09/11/2017  CTson 09/17/2017-3 cm necrotic appearing lymph node in the left inguinal region, borderline enlarged pelvic/retroperitoneal, chest, and axillary nodes. Mild splenomegaly.  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, no evidence of 11q22deletion  Bone marrow biopsy 02/26/2018-hypercellular marrow with extensive involvement by CLL, lymphocytes represent85% of all cells  Ibrutinib initiated 04/03/2018  Ibrutinib placed on hold 04/11/2018 due to onset of arthralgias  Ibrutinib resumed 04/16/2018, discontinued 04/25/2018 secondary to severe arthralgias/arthritis  Ibrutinib resumed at a dose of 140 mg daily 05/03/2018  Ibrutinib dose adjusted to 140 mg alternating with 279m 06/25/2018  Ibrutinib discontinued 07/03/2018 secondary to severe arthralgias  Acalabrutinib 08/16/2018, discontinued 11/15/2018 secondary to persistent severe transfusion dependent anemia and neutropenia/thrombocytopenia, last dose 11/14/2018  Bone marrow biopsy 11/21/2018-decreased cellularity, involvement by CLL, decreased erythroid and granulocytic precursors, decreased megakaryocytes  Cycle 1 rituximab 12/06/2018  Bone marrow biopsy 12/24/2018 at [facility]-hypocellular bone marrow (10%) involved by  CLL, representing 50% of marrow  cellularity; markedly decreased trilineage hematopoiesis including essentially absent erythropoiesis  Bone marrow biopsy 03/06/2027 [facility]-hypocellular bone marrow (20%) with marked reduction of maturing hematopoietic elements; numerous lymphoid aggregates consistent with involvement by CLL, representing approximately 50% of marrow cellularity by flow cytometry  ATG[facility]cyclosporine at [facility] beginning 03/19/2019 followed by prednisone taper  Eltrombopag beginning 07/05/2019  Cyclosporine discontinued 07/08/2019  Tacrolimus 07/10/2019  2.Hypothyroidism 3.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 Augmentin prescribed. 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 Pancytopenia secondary to CLL and a hypocellular bone marrow  G-CSF and Nplate started 7/51/0258, G-CSF changed to daily beginning 12/17/2018; G-CSF discontinued 12/31/2018, last Nplate 01/15/2019  Began prednisone 60 mg daily 01/11/19, tolerating moderately well except jitteriness, irritability, and difficulty sleeping. Tapered to 40 mg daily x1 week starting 01/24/19, reduce by 10 mg each week until discontinued. Prednisone discontinued 02/20/2019.  Promacta started 01/20/2019, dose increased to 100 mg daily 02/04/2019; dose increased to 150 mg daily 02/21/2019  IVIG daily for 2 days beginning 02/25/2019   9. 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  No lymphadenopathy or splenomegaly.  Exjade beginning 06/28/2019. 13.  Elevated creatinine-cyclosporine toxicity? 14.  Vaginal bleeding beginning 07/13/2019      Disposition: Meagan Walsh appears stable.  She has persistent pancytopenia.  She was transfused with platelets on 07/13/2019.  No transfusion indicated today.  She reports vaginal spotting with blood-tinged urine.  This may be related to the expiring Mirena device in the setting of thrombocytopenia.  She will contact us with the name of her gynecologist and we will arrange for an appointment.  She has persistent nausea despite discontinuing cyclosporine last week.  The nausea could be related to one of her other medications.  We will contact [facility] to discuss medication changes.  She has renal insufficiency, potentially related to polypharmacy.  She would like an orthopedic referral to evaluate the shoulder pain.  Meagan Walsh will return for lab visit on 07/17/2019 and an office visit on 07/21/2019.  Betsy Coder, MD  07/14/2019  2:09 PM

## 2019-07-15 ENCOUNTER — Encounter: Admit: 2019-07-15 | Discharge: 2019-07-15 | Payer: PRIVATE HEALTH INSURANCE

## 2019-07-15 ENCOUNTER — Telehealth: Payer: Self-pay | Admitting: Oncology

## 2019-07-15 DIAGNOSIS — M25512 Pain in left shoulder: Secondary | ICD-10-CM | POA: Diagnosis not present

## 2019-07-15 DIAGNOSIS — M7541 Impingement syndrome of right shoulder: Secondary | ICD-10-CM | POA: Diagnosis not present

## 2019-07-15 DIAGNOSIS — M25511 Pain in right shoulder: Secondary | ICD-10-CM | POA: Diagnosis not present

## 2019-07-15 DIAGNOSIS — M7501 Adhesive capsulitis of right shoulder: Secondary | ICD-10-CM | POA: Diagnosis not present

## 2019-07-15 DIAGNOSIS — C9110 Chronic lymphocytic leukemia of B-cell type not having achieved remission: Secondary | ICD-10-CM | POA: Diagnosis not present

## 2019-07-15 NOTE — Telephone Encounter (Signed)
Scheduled per los. Called and spoke left msg.

## 2019-07-16 DIAGNOSIS — Z005 Encounter for examination of potential donor of organ and tissue: Principal | ICD-10-CM

## 2019-07-17 ENCOUNTER — Encounter: Admit: 2019-07-17 | Discharge: 2019-07-17 | Payer: PRIVATE HEALTH INSURANCE

## 2019-07-17 ENCOUNTER — Inpatient Hospital Stay: Payer: BC Managed Care – PPO

## 2019-07-17 ENCOUNTER — Other Ambulatory Visit: Payer: Self-pay | Admitting: *Deleted

## 2019-07-17 ENCOUNTER — Other Ambulatory Visit: Payer: Self-pay

## 2019-07-17 ENCOUNTER — Telehealth: Payer: Self-pay | Admitting: Oncology

## 2019-07-17 DIAGNOSIS — C9110 Chronic lymphocytic leukemia of B-cell type not having achieved remission: Secondary | ICD-10-CM | POA: Diagnosis not present

## 2019-07-17 DIAGNOSIS — M25519 Pain in unspecified shoulder: Secondary | ICD-10-CM | POA: Diagnosis not present

## 2019-07-17 DIAGNOSIS — D801 Nonfamilial hypogammaglobulinemia: Secondary | ICD-10-CM | POA: Diagnosis not present

## 2019-07-17 DIAGNOSIS — Z005 Encounter for examination of potential donor of organ and tissue: Secondary | ICD-10-CM | POA: Diagnosis not present

## 2019-07-17 DIAGNOSIS — D61818 Other pancytopenia: Secondary | ICD-10-CM | POA: Diagnosis not present

## 2019-07-17 DIAGNOSIS — R5381 Other malaise: Secondary | ICD-10-CM | POA: Diagnosis not present

## 2019-07-17 DIAGNOSIS — D696 Thrombocytopenia, unspecified: Secondary | ICD-10-CM

## 2019-07-17 DIAGNOSIS — R112 Nausea with vomiting, unspecified: Secondary | ICD-10-CM | POA: Diagnosis not present

## 2019-07-17 DIAGNOSIS — D649 Anemia, unspecified: Secondary | ICD-10-CM

## 2019-07-17 DIAGNOSIS — Z95828 Presence of other vascular implants and grafts: Secondary | ICD-10-CM

## 2019-07-17 DIAGNOSIS — N939 Abnormal uterine and vaginal bleeding, unspecified: Secondary | ICD-10-CM | POA: Diagnosis not present

## 2019-07-17 DIAGNOSIS — E039 Hypothyroidism, unspecified: Secondary | ICD-10-CM | POA: Diagnosis not present

## 2019-07-17 DIAGNOSIS — Z79899 Other long term (current) drug therapy: Secondary | ICD-10-CM | POA: Diagnosis not present

## 2019-07-17 LAB — CBC WITH DIFFERENTIAL (CANCER CENTER ONLY)
Abs Immature Granulocytes: 0.09 10*3/uL — ABNORMAL HIGH (ref 0.00–0.07)
Basophils Absolute: 0 10*3/uL (ref 0.0–0.1)
Basophils Relative: 0 %
Eosinophils Absolute: 0.1 10*3/uL (ref 0.0–0.5)
Eosinophils Relative: 1 %
HCT: 23.9 % — ABNORMAL LOW (ref 36.0–46.0)
Hemoglobin: 8.1 g/dL — ABNORMAL LOW (ref 12.0–15.0)
Immature Granulocytes: 1 %
Lymphocytes Relative: 89 %
Lymphs Abs: 6.2 10*3/uL — ABNORMAL HIGH (ref 0.7–4.0)
MCH: 29.7 pg (ref 26.0–34.0)
MCHC: 33.9 g/dL (ref 30.0–36.0)
MCV: 87.5 fL (ref 80.0–100.0)
Monocytes Absolute: 0.3 10*3/uL (ref 0.1–1.0)
Monocytes Relative: 4 %
Neutro Abs: 0.3 10*3/uL — CL (ref 1.7–7.7)
Neutrophils Relative %: 5 %
Platelet Count: 25 10*3/uL — ABNORMAL LOW (ref 150–400)
RBC: 2.73 MIL/uL — ABNORMAL LOW (ref 3.87–5.11)
RDW: 13.9 % (ref 11.5–15.5)
WBC Count: 7.1 10*3/uL (ref 4.0–10.5)
nRBC: 0 % (ref 0.0–0.2)

## 2019-07-17 LAB — SAMPLE TO BLOOD BANK

## 2019-07-17 LAB — CMP (CANCER CENTER ONLY)
ALT: 11 U/L (ref 0–44)
AST: 17 U/L (ref 15–41)
Albumin: 3.9 g/dL (ref 3.5–5.0)
Alkaline Phosphatase: 114 U/L (ref 38–126)
Anion gap: 11 (ref 5–15)
BUN: 25 mg/dL — ABNORMAL HIGH (ref 6–20)
CO2: 23 mmol/L (ref 22–32)
Calcium: 9.6 mg/dL (ref 8.9–10.3)
Chloride: 107 mmol/L (ref 98–111)
Creatinine: 1.59 mg/dL — ABNORMAL HIGH (ref 0.44–1.00)
GFR, Est AFR Am: 41 mL/min — ABNORMAL LOW (ref >60)
GFR, Estimated: 35 mL/min — ABNORMAL LOW (ref >60)
Glucose, Bld: 183 mg/dL — ABNORMAL HIGH (ref 70–99)
Potassium: 3.9 mmol/L (ref 3.5–5.1)
Sodium: 141 mmol/L (ref 135–145)
Total Bilirubin: 0.7 mg/dL (ref 0.3–1.2)
Total Protein: 5.9 g/dL — ABNORMAL LOW (ref 6.5–8.1)

## 2019-07-17 LAB — PREPARE RBC (CROSSMATCH)

## 2019-07-17 MED ORDER — SODIUM CHLORIDE 0.9% FLUSH
10.0000 mL | INTRAVENOUS | Status: DC | PRN
  Administered 2019-07-17: 10 mL
  Filled 2019-07-17: qty 10

## 2019-07-17 MED ORDER — SODIUM CHLORIDE 0.9% IV SOLUTION
250.0000 mL | Freq: Once | INTRAVENOUS | Status: AC
  Administered 2019-07-17: 11:00:00 250 mL via INTRAVENOUS
  Filled 2019-07-17: qty 250

## 2019-07-17 MED ORDER — SODIUM CHLORIDE 0.9% FLUSH
10.0000 mL | INTRAVENOUS | Status: AC | PRN
  Administered 2019-07-17: 15:00:00 10 mL
  Filled 2019-07-17: qty 10

## 2019-07-17 MED ORDER — HEPARIN SOD (PORK) LOCK FLUSH 100 UNIT/ML IV SOLN
500.0000 [IU] | Freq: Once | INTRAVENOUS | Status: AC | PRN
  Administered 2019-07-17: 250 [IU]
  Filled 2019-07-17: qty 5

## 2019-07-17 MED ORDER — HEPARIN SOD (PORK) LOCK FLUSH 100 UNIT/ML IV SOLN
250.0000 [IU] | INTRAVENOUS | Status: AC | PRN
  Administered 2019-07-17: 250 [IU]
  Filled 2019-07-17: qty 5

## 2019-07-17 NOTE — Telephone Encounter (Signed)
Scheduled appt per 12/24 sch message - pt is aware of appt date and time

## 2019-07-17 NOTE — Progress Notes (Signed)
Notified blood bank of order to transfuse 1 unit blood today. CRITICAL VALUE STICKER  CRITICAL VALUE: ANC 0.3  RECEIVER (on-site recipient of call):Manly Nestle, [place] NOTIFIED: 07/17/19 @ 1054  MESSENGER (representative from lab):Pam  MD NOTIFIED: Dr. Benay Spice  TIME OF NOTIFICATION:1100  RESPONSE: Stable.

## 2019-07-17 NOTE — Patient Instructions (Signed)
Blood Transfusion, Adult, Care After This sheet gives you information about how to care for yourself after your procedure. Your doctor may also give you more specific instructions. If you have problems or questions, contact your doctor. Follow these instructions at home:   Take over-the-counter and prescription medicines only as told by your doctor.  Go back to your normal activities as told by your doctor.  Follow instructions from your doctor about how to take care of the area where an IV tube was put into your vein (insertion site). Make sure you: ? Wash your hands with soap and water before you change your bandage (dressing). If there is no soap and water, use hand sanitizer. ? Change your bandage as told by your doctor.  Check your IV insertion site every day for signs of infection. Check for: ? More redness, swelling, or pain. ? More fluid or blood. ? Warmth. ? Pus or a bad smell. Contact a doctor if:  You have more redness, swelling, or pain around the IV insertion site.  You have more fluid or blood coming from the IV insertion site.  Your IV insertion site feels warm to the touch.  You have pus or a bad smell coming from the IV insertion site.  Your pee (urine) turns pink, red, or brown.  You feel weak after doing your normal activities. Get help right away if:  You have signs of a serious allergic or body defense (immune) system reaction, including: ? Itchiness. ? Hives. ? Trouble breathing. ? Anxiety. ? Pain in your chest or lower back. ? Fever, flushing, and chills. ? Fast pulse. ? Rash. ? Watery poop (diarrhea). ? Throwing up (vomiting). ? Dark pee. ? Serious headache. ? Dizziness. ? Stiff neck. ? Yellow color in your face or the white parts of your eyes (jaundice). Summary  After a blood transfusion, return to your normal activities as told by your doctor.  Every day, check for signs of infection where the IV tube was put into your vein.  Some  signs of infection are warm skin, more redness and pain, more fluid or blood, and pus or a bad smell where the needle went in.  Contact your doctor if you feel weak or have any unusual symptoms. This information is not intended to replace advice given to you by your health care provider. Make sure you discuss any questions you have with your health care provider. Document Released: 07/31/2014 Document Revised: 11/14/2017 Document Reviewed: 03/03/2016 Elsevier Patient Education  2020 Elsevier Inc.  

## 2019-07-18 LAB — TYPE AND SCREEN
ABO/RH(D): O POS
Antibody Screen: NEGATIVE
Unit division: 0

## 2019-07-18 LAB — BPAM RBC
Blood Product Expiration Date: 202101212359
ISSUE DATE / TIME: 202012241152
Unit Type and Rh: 5100

## 2019-07-21 ENCOUNTER — Inpatient Hospital Stay: Payer: BC Managed Care – PPO

## 2019-07-21 ENCOUNTER — Inpatient Hospital Stay (HOSPITAL_BASED_OUTPATIENT_CLINIC_OR_DEPARTMENT_OTHER): Payer: BC Managed Care – PPO | Admitting: Nurse Practitioner

## 2019-07-21 ENCOUNTER — Encounter: Payer: Self-pay | Admitting: Nurse Practitioner

## 2019-07-21 ENCOUNTER — Other Ambulatory Visit: Payer: Self-pay

## 2019-07-21 VITALS — BP 134/85 | HR 101 | Temp 98.5°F | Resp 17 | Ht 66.0 in | Wt 127.7 lb

## 2019-07-21 DIAGNOSIS — C9110 Chronic lymphocytic leukemia of B-cell type not having achieved remission: Secondary | ICD-10-CM | POA: Diagnosis not present

## 2019-07-21 DIAGNOSIS — R112 Nausea with vomiting, unspecified: Secondary | ICD-10-CM | POA: Diagnosis not present

## 2019-07-21 DIAGNOSIS — D696 Thrombocytopenia, unspecified: Secondary | ICD-10-CM

## 2019-07-21 DIAGNOSIS — D61818 Other pancytopenia: Secondary | ICD-10-CM | POA: Diagnosis not present

## 2019-07-21 DIAGNOSIS — Z79899 Other long term (current) drug therapy: Secondary | ICD-10-CM | POA: Diagnosis not present

## 2019-07-21 DIAGNOSIS — D649 Anemia, unspecified: Secondary | ICD-10-CM

## 2019-07-21 DIAGNOSIS — D801 Nonfamilial hypogammaglobulinemia: Secondary | ICD-10-CM | POA: Diagnosis not present

## 2019-07-21 DIAGNOSIS — Z95828 Presence of other vascular implants and grafts: Secondary | ICD-10-CM

## 2019-07-21 DIAGNOSIS — N939 Abnormal uterine and vaginal bleeding, unspecified: Secondary | ICD-10-CM | POA: Diagnosis not present

## 2019-07-21 DIAGNOSIS — E039 Hypothyroidism, unspecified: Secondary | ICD-10-CM | POA: Diagnosis not present

## 2019-07-21 DIAGNOSIS — M25519 Pain in unspecified shoulder: Secondary | ICD-10-CM | POA: Diagnosis not present

## 2019-07-21 DIAGNOSIS — R5381 Other malaise: Secondary | ICD-10-CM | POA: Diagnosis not present

## 2019-07-21 DIAGNOSIS — Z005 Encounter for examination of potential donor of organ and tissue: Principal | ICD-10-CM

## 2019-07-21 LAB — CBC WITH DIFFERENTIAL (CANCER CENTER ONLY)
Abs Immature Granulocytes: 0.18 10*3/uL — ABNORMAL HIGH (ref 0.00–0.07)
Basophils Absolute: 0 10*3/uL (ref 0.0–0.1)
Basophils Relative: 0 %
Eosinophils Absolute: 0 10*3/uL (ref 0.0–0.5)
Eosinophils Relative: 0 %
HCT: 27 % — ABNORMAL LOW (ref 36.0–46.0)
Hemoglobin: 8.8 g/dL — ABNORMAL LOW (ref 12.0–15.0)
Immature Granulocytes: 2 %
Lymphocytes Relative: 92 %
Lymphs Abs: 9.9 10*3/uL — ABNORMAL HIGH (ref 0.7–4.0)
MCH: 27.8 pg (ref 26.0–34.0)
MCHC: 32.6 g/dL (ref 30.0–36.0)
MCV: 85.4 fL (ref 80.0–100.0)
Monocytes Absolute: 0.3 10*3/uL (ref 0.1–1.0)
Monocytes Relative: 3 %
Neutro Abs: 0.3 10*3/uL — CL (ref 1.7–7.7)
Neutrophils Relative %: 3 %
Platelet Count: 28 10*3/uL — ABNORMAL LOW (ref 150–400)
RBC: 3.16 MIL/uL — ABNORMAL LOW (ref 3.87–5.11)
RDW: 15.9 % — ABNORMAL HIGH (ref 11.5–15.5)
WBC Count: 10.5 10*3/uL (ref 4.0–10.5)
nRBC: 0 % (ref 0.0–0.2)

## 2019-07-21 LAB — CMP (CANCER CENTER ONLY)
ALT: 21 U/L (ref 0–44)
AST: 20 U/L (ref 15–41)
Albumin: 3.9 g/dL (ref 3.5–5.0)
Alkaline Phosphatase: 115 U/L (ref 38–126)
Anion gap: 7 (ref 5–15)
BUN: 25 mg/dL — ABNORMAL HIGH (ref 6–20)
CO2: 26 mmol/L (ref 22–32)
Calcium: 9.9 mg/dL (ref 8.9–10.3)
Chloride: 107 mmol/L (ref 98–111)
Creatinine: 1.21 mg/dL — ABNORMAL HIGH (ref 0.44–1.00)
GFR, Est AFR Am: 57 mL/min — ABNORMAL LOW (ref >60)
GFR, Estimated: 49 mL/min — ABNORMAL LOW (ref >60)
Glucose, Bld: 139 mg/dL — ABNORMAL HIGH (ref 70–99)
Potassium: 4.5 mmol/L (ref 3.5–5.1)
Sodium: 140 mmol/L (ref 135–145)
Total Bilirubin: 0.5 mg/dL (ref 0.3–1.2)
Total Protein: 6 g/dL — ABNORMAL LOW (ref 6.5–8.1)

## 2019-07-21 LAB — SAMPLE TO BLOOD BANK

## 2019-07-21 MED ORDER — SODIUM CHLORIDE 0.9% FLUSH
10.0000 mL | INTRAVENOUS | Status: DC | PRN
  Administered 2019-07-21: 10 mL
  Filled 2019-07-21: qty 10

## 2019-07-21 MED ORDER — HEPARIN SOD (PORK) LOCK FLUSH 100 UNIT/ML IV SOLN
500.0000 [IU] | Freq: Once | INTRAVENOUS | Status: AC | PRN
  Administered 2019-07-21: 500 [IU]
  Filled 2019-07-21: qty 5

## 2019-07-21 NOTE — Patient Instructions (Signed)
Resume Levaquin

## 2019-07-21 NOTE — Progress Notes (Addendum)
[facility] OFFICE PROGRESS NOTE   Diagnosis: CLL, aplastic anemia  INTERVAL HISTORY:   Meagan Walsh returns as scheduled.  She was transfused a unit of blood on 07/17/2019.  She continues to have vaginal bleeding.  She has not contacted gynecology.  No other bleeding.  Gums are "better".  Nausea is slightly better.  She has not vomited for the past 5 days.  She began holding Levaquin 07/19/2019.  No fever.  Objective:  Vital signs in last 24 hours:  Blood pressure 134/85, pulse (!) 101, temperature 98.5 F (36.9 C), temperature source Temporal, resp. rate 17, height _0  (1.676 m), weight 127 lb 11.2 oz (57.9 kg), SpO2 100 %.    HEENT: Gums are hypertrophied, erythematous.  No active bleeding. GI: Abdomen soft and nontender.  No hepatosplenomegaly. Vascular: No leg edema. Neuro: Alert and oriented.  Left upper extremity PICC without erythema.  Lab Results:  Lab Results  Component Value Date   WBC 10.5 07/21/2019   HGB 8.8 (L) 07/21/2019   HCT 27.0 (L) 07/21/2019   MCV 85.4 07/21/2019   PLT 28 (L) 07/21/2019   NEUTROABS PENDING 07/21/2019    Imaging:  No results found.  Medications: I have reviewed the patient's current medications.  Assessment/Plan: 1. 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 Mild splenomegaly.  Ultrasound-guided biopsy of the left inguinal lymph node 09/18/2017, slightly "purulent "fluid aspirated, core biopsy is consistent with an atypical lymphoid proliferation-extensive necrosis with surrounding epithelioid histiocytes, limited intact lymphoid tissue involved with CLL  Incisional biopsy of a necrotic/purulent left inguinal lymph node on  10/01/2017-extensive necrosis with granulomatous inflammation, small amount of viable lymphoid tissue involved with CLL, AFB and fungal stains negative  Peripheral blood FISH analysis 02/05/2018-deletion 13q14, no evidence of p53 (17p13) deletion, no evidence of 11q22deletion  Bone marrow biopsy 02/26/2018-hypercellular marrow with extensive involvement by CLL, lymphocytes represent85% of all cells  Ibrutinib initiated 04/03/2018  Ibrutinib placed on hold 04/11/2018 due to onset of arthralgias  Ibrutinib resumed 04/16/2018, discontinued 04/25/2018 secondary to severe arthralgias/arthritis  Ibrutinib resumed at a dose of 140 mg daily 05/03/2018  Ibrutinib dose adjusted to 140 mg alternating with 254m 06/25/2018  Ibrutinib discontinued 07/03/2018 secondary to severe arthralgias  Acalabrutinib 08/16/2018, discontinued 11/15/2018 secondary to persistent severe transfusion dependent anemia and neutropenia/thrombocytopenia, last dose 11/14/2018  Bone marrow biopsy 11/21/2018-decreased cellularity, involvement by CLL, decreased erythroid and granulocytic precursors, decreased megakaryocytes  Cycle 1 rituximab 12/06/2018  Bone marrow biopsy 12/24/2018 at [facility]-hypocellular bone marrow (10%) involved by CLL, representing 50% of marrow cellularity; markedly decreased trilineage hematopoiesis including essentially absent erythropoiesis  Bone marrow biopsy 03/06/2027 [facility]-hypocellular bone marrow (20%) with marked reduction of maturing hematopoietic elements; numerous lymphoid aggregates consistent with involvement by CLL, representing approximately 50% of marrow cellularity by flow cytometry  [facility]  Eltrombopag beginning 07/05/2019  Cyclosporine discontinued 07/08/2019  Tacrolimus 07/10/2019  2.Hypothyroidism 3.Hepatitis B surface and core antibody positive  Hepatitis B surface antigen negative and hepatitis B core antibody  -12/02/2018 4.Left lung pneumonia diagnosed 10/08/2017-completed 7 days of Levaquin 5.Left lung pneumoniaon chest x-ray 12/27/2017. Augmentin prescribed. 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 Pancytopenia secondary  to CLL and a hypocellular bone marrow  G-CSF and Nplate started 2/44/9753, G-CSF changed to daily beginning 12/17/2018; G-CSF discontinued 12/31/2018, last Nplate 01/15/2019  Began prednisone 60 mg daily 01/11/19, tolerating moderately well except jitteriness, irritability, and difficulty sleeping. Tapered to 40 mg daily x1 week starting 01/24/19, reduce by 10 mg each week until discontinued. Prednisone discontinued 02/20/2019.  Promacta started 01/20/2019, dose increased to 100 mg daily 02/04/2019; dose increased to 150 mg daily 02/21/2019  IVIG daily for 2 days beginning 02/25/2019   9. 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 No lymphadenopathy or splenomegaly.  Exjade beginning 06/28/2019. 13.  Elevated creatinine-cyclosporine toxicity? 14.  Vaginal bleeding beginning 07/13/2019-referred to gynecology   Disposition: [name] appears stable.  We reviewed the CBC from today.  Hemoglobin is better following the recent blood transfusion.  The platelet count is stable.  Absolute neutrophil count is stable.  She will continue tacrolimus.    The nausea is better.  This predated holding Levaquin.  She will resume Levaquin.  She will contact gynecology regarding the vaginal bleeding.  She will return for labs on 07/24/2019 and 07/27/2018.  We will see her in follow-up on 07/31/2018.  She will  contact the office in the interim with any problems.  Patient seen with Dr. Benay Spice.Ned Card ANP/GNP-BC   07/21/2019  12:06 PM  This was a shared visit with Ned Card.  [name] appears partially improved today.  Nausea is better.  The platelet count has stabilized over the past week.  We encouraged her to schedule appointment with GYN to evaluate the vaginal bleeding.  She will complete the Medrol Dosepak for the adhesive capsulitis.  We will ask orthopedics to a joint injection if the steroids do not help.  [name], MD

## 2019-07-22 ENCOUNTER — Other Ambulatory Visit: Payer: Self-pay | Admitting: Oncology

## 2019-07-22 ENCOUNTER — Other Ambulatory Visit: Payer: Self-pay | Admitting: *Deleted

## 2019-07-22 DIAGNOSIS — E039 Hypothyroidism, unspecified: Secondary | ICD-10-CM

## 2019-07-22 LAB — SBT HLA B #1

## 2019-07-22 LAB — HLA CL I&II, HIGH RES

## 2019-07-22 NOTE — Unmapped (Signed)
Hi,    [facility] calling on behalf of patient BARBIE CROSTON called requesting a medication refill for the following:    ? Medication: cyclosporine  ? Dosage: N/A  ? Days left of medication: 0  ? Pharmacy: [facility]    Medication is not listed on patient's med tab in Epic, unable to clarify dosage.    The expected turnaround time is 3-4 business days       Check Indicates criteria has been reviewed and confirmed with the patient:    [x]  Preferred Name   [x]  DOB and/or MR#  [x]  Preferred Contact Method  [x]  Phone Number(s)   [x]  Preferred Pharmacy   []  MyChart     Thank you,  Jannette Spanner  [facility]  [phone]

## 2019-07-22 NOTE — Progress Notes (Signed)
Patient has requested Dr. Benay Spice refill her levothyroxine. No TSH on file for her. Not able to add to labs drawn on 12/28, so will draw on 12/31 visit.

## 2019-07-23 ENCOUNTER — Encounter: Payer: Self-pay | Admitting: *Deleted

## 2019-07-23 ENCOUNTER — Telehealth: Payer: Self-pay | Admitting: Oncology

## 2019-07-23 LAB — TACROLIMUS LEVEL: Tacrolimus (FK506) - LabCorp: 6.5 ng/mL (ref 2.0–20.0)

## 2019-07-23 LAB — HLA CL I&II, HIGH RES

## 2019-07-23 LAB — SBT HLA A #2

## 2019-07-23 LAB — SBT HLA DPB1 #1

## 2019-07-23 NOTE — Unmapped (Signed)
On 12/15, Ms. Nipp was switched from cyclosporine to tacrolimus. This was mainly due to the limitations of nausea/vomiting on her levels and quality of life. If she were to get a transplant she would need to tolerate immunosuppression therapy, and so therefore the decision was made to switch her to tacrolimus.     She was started on 1 mg BID. This dose was determined based off a target dose of 0.03 mg/kg BID with a 50% empiric dose reduction for the posaconazole interaction. Patient's tacrolimus level from 12/29 came back 6.5 ng/mL (goal 5-15). This is at goal, but can change based on the unpredictability of tac kinetics upon initiating. Her SCr is improving down to 1.21 from a high of 1.65 on 12/21. Her LFTs are WNL. CBC is stable. Of note, she is currently on Exjade 1000 mg daily and Promacta 150 mg daily.    Continue tacrolimus 1 mg BID. She should remain on ppx with posaconazole indefinitely to avoid stopping/starting which impacts tacrolimus metabolism and levels. She should continue other ppx of levofloxacin while ANC < 0.5, and valtrex and Bactrim indefinitely. Should continue to get weekly tacrolimus troughs locally. I will continue to follow.       Manfred Arch, PharmD, BCOP, CPP  Pager: [phone]

## 2019-07-23 NOTE — Telephone Encounter (Signed)
Scheduled per los. Called and spoke with patient. Confirmed appts  

## 2019-07-23 NOTE — Progress Notes (Signed)
Faxed Tacrolimus level to [place] [phone] att: Joellen Jersey, PharmD

## 2019-07-24 ENCOUNTER — Other Ambulatory Visit: Payer: Self-pay

## 2019-07-24 ENCOUNTER — Telehealth: Payer: Self-pay | Admitting: *Deleted

## 2019-07-24 ENCOUNTER — Inpatient Hospital Stay: Payer: BC Managed Care – PPO

## 2019-07-24 DIAGNOSIS — E039 Hypothyroidism, unspecified: Secondary | ICD-10-CM

## 2019-07-24 DIAGNOSIS — C9110 Chronic lymphocytic leukemia of B-cell type not having achieved remission: Secondary | ICD-10-CM

## 2019-07-24 DIAGNOSIS — D801 Nonfamilial hypogammaglobulinemia: Secondary | ICD-10-CM | POA: Diagnosis not present

## 2019-07-24 DIAGNOSIS — Z79899 Other long term (current) drug therapy: Secondary | ICD-10-CM | POA: Diagnosis not present

## 2019-07-24 DIAGNOSIS — R5381 Other malaise: Secondary | ICD-10-CM | POA: Diagnosis not present

## 2019-07-24 DIAGNOSIS — D61818 Other pancytopenia: Secondary | ICD-10-CM | POA: Diagnosis not present

## 2019-07-24 DIAGNOSIS — M25519 Pain in unspecified shoulder: Secondary | ICD-10-CM | POA: Diagnosis not present

## 2019-07-24 DIAGNOSIS — Z95828 Presence of other vascular implants and grafts: Secondary | ICD-10-CM

## 2019-07-24 DIAGNOSIS — D696 Thrombocytopenia, unspecified: Secondary | ICD-10-CM

## 2019-07-24 DIAGNOSIS — D649 Anemia, unspecified: Secondary | ICD-10-CM

## 2019-07-24 DIAGNOSIS — N939 Abnormal uterine and vaginal bleeding, unspecified: Secondary | ICD-10-CM | POA: Diagnosis not present

## 2019-07-24 DIAGNOSIS — R112 Nausea with vomiting, unspecified: Secondary | ICD-10-CM | POA: Diagnosis not present

## 2019-07-24 LAB — CBC WITH DIFFERENTIAL (CANCER CENTER ONLY)
Abs Immature Granulocytes: 0.05 10*3/uL (ref 0.00–0.07)
Basophils Absolute: 0 10*3/uL (ref 0.0–0.1)
Basophils Relative: 0 %
Eosinophils Absolute: 0.1 10*3/uL (ref 0.0–0.5)
Eosinophils Relative: 1 %
HCT: 24.6 % — ABNORMAL LOW (ref 36.0–46.0)
Hemoglobin: 8 g/dL — ABNORMAL LOW (ref 12.0–15.0)
Immature Granulocytes: 1 %
Lymphocytes Relative: 91 %
Lymphs Abs: 6.9 10*3/uL — ABNORMAL HIGH (ref 0.7–4.0)
MCH: 27.3 pg (ref 26.0–34.0)
MCHC: 32.5 g/dL (ref 30.0–36.0)
MCV: 84 fL (ref 80.0–100.0)
Monocytes Absolute: 0.2 10*3/uL (ref 0.1–1.0)
Monocytes Relative: 3 %
Neutro Abs: 0.3 10*3/uL — CL (ref 1.7–7.7)
Neutrophils Relative %: 4 %
Platelet Count: 27 10*3/uL — ABNORMAL LOW (ref 150–400)
RBC: 2.93 MIL/uL — ABNORMAL LOW (ref 3.87–5.11)
RDW: 15.8 % — ABNORMAL HIGH (ref 11.5–15.5)
WBC Count: 7.6 10*3/uL (ref 4.0–10.5)
nRBC: 0 % (ref 0.0–0.2)

## 2019-07-24 LAB — SAMPLE TO BLOOD BANK

## 2019-07-24 LAB — TSH: TSH: 0.869 u[IU]/mL (ref 0.308–3.960)

## 2019-07-24 MED ORDER — SODIUM CHLORIDE 0.9% FLUSH
10.0000 mL | INTRAVENOUS | Status: DC | PRN
  Administered 2019-07-24: 10 mL
  Filled 2019-07-24: qty 10

## 2019-07-24 MED ORDER — HEPARIN SOD (PORK) LOCK FLUSH 100 UNIT/ML IV SOLN
500.0000 [IU] | Freq: Once | INTRAVENOUS | Status: AC | PRN
  Administered 2019-07-24: 250 [IU]
  Filled 2019-07-24: qty 5

## 2019-07-24 NOTE — Telephone Encounter (Signed)
MD reviewed CBC/diff results and patient notified. Dr. Benay Spice suggests trying to hold off on blood transfusion till Monday if she agrees. Patient agrees to this. Denies any bleeding. Scheduling message sent for 1 unit blood on 07/28/2019.

## 2019-07-24 NOTE — Patient Instructions (Signed)
PICC Home Care Guide  A peripherally inserted central catheter (PICC) is a form of IV access that allows medicines and IV fluids to be quickly distributed throughout the body. The PICC is a long, thin, flexible tube (catheter) that is inserted into a vein in the upper arm. The catheter ends in a large vein in the chest (superior vena cava, or SVC). After the PICC is inserted, a chest X-ray may be done to make sure that it is in the correct place. A PICC may be placed for different reasons, such as:  To give medicines and liquid nutrition.  To give IV fluids and blood products.  If there is trouble placing a peripheral intravenous (PIV) catheter. If taken care of properly, a PICC can remain in place for several months. Having a PICC can also allow a person to go home from the hospital sooner. Medicine and PICC care can be managed at home by a family member, caregiver, or home health care team. What are the risks? Generally, having a PICC is safe. However, problems may occur, including:  A blood clot (thrombus) forming in or at the tip of the PICC.  A blood clot forming in a vein (deep vein thrombosis) or traveling to the lung (pulmonary embolism).  Inflammation of the vein (phlebitis) in which the PICC is placed.  Infection. Central line associated blood stream infection (CLABSI) is a serious infection that often requires hospitalization.  PICC movement (malposition). The PICC tip may move from its original position due to excessive physical activity, forceful coughing, sneezing, or vomiting.  A break or cut in the PICC. It is important not to use scissors near the PICC.  Nerve or tendon irritation or injury during PICC insertion. How to take care of your PICC Preventing problems  You and any caregivers should wash your hands often with soap. Wash hands: ? Before touching the PICC line or the infusion device. ? Before changing a bandage (dressing).  Flush the PICC as told by your  health care provider. Let your health care provider know right away if the PICC is hard to flush or does not flush. Do not use force to flush the PICC.  Do not use a syringe that is less than 10 mL to flush the PICC.  Avoid blood pressure checks on the arm in which the PICC is placed.  Never pull or tug on the PICC.  Do not take the PICC out yourself. Only a trained clinical professional should remove the PICC.  Use clean and sterile supplies only. Keep the supplies in a dry place. Do not reuse needles, syringes, or any other supplies. Doing that can lead to infection.  Keep pets and children away from your PICC line.  Check the PICC insertion site every day for signs of infection. Check for: ? Leakage. ? Redness, swelling, or pain. ? Fluid or blood. ? Warmth. ? Pus or a bad smell. PICC dressing care  Keep your PICC bandage (dressing) clean and dry to prevent infection.  Do not take baths, swim, or use a hot tub until your health care provider approves. Ask your health care provider if you can take showers. You may only be allowed to take sponge baths for bathing. When you are allowed to shower: ? Ask your health care provider to teach you how to wrap the PICC line. ? Cover the PICC line with clear plastic wrap and tape to keep it dry while showering.  Follow instructions from your health care provider   about how to take care of your insertion site and dressing. Make sure you: ? Wash your hands with soap and water before you change your bandage (dressing). If soap and water are not available, use hand sanitizer. ? Change your dressing as told by your health care provider. ? Leave stitches (sutures), skin glue, or adhesive strips in place. These skin closures may need to stay in place for 2 weeks or longer. If adhesive strip edges start to loosen and curl up, you may trim the loose edges. Do not remove adhesive strips completely unless your health care provider tells you to do  that.  Change your PICC dressing if it becomes loose or wet. General instructions   Carry your PICC identification card or wear a medical alert bracelet at all times.  Keep the tube clamped at all times, unless it is being used.  Carry a smooth-edge clamp with you at all times to place on the tube if it breaks.  Do not use scissors or sharp objects near the tube.  You may bend your arm and move it freely. If your PICC is near or at the bend of your elbow, avoid activity with repeated motion at the elbow.  Avoid lifting heavy objects as told by your health care provider.  Keep all follow-up visits as told by your health care provider. This is important. Disposal of supplies  Throw away any syringes in a disposal container that is meant for sharp items (sharps container). You can buy a sharps container from a pharmacy, or you can make one by using an empty hard plastic bottle with a cover.  Place any used dressings or infusion bags into a plastic bag. Throw that bag in the trash. Contact a health care provider if:  You have pain in your arm, ear, face, or teeth.  You have a fever or chills.  You have redness, swelling, or pain around the insertion site.  You have fluid or blood coming from the insertion site.  Your insertion site feels warm to the touch.  You have pus or a bad smell coming from the insertion site.  Your skin feels hard and raised around the insertion site. Get help right away if:  Your PICC is accidentally pulled all the way out. If this happens, cover the insertion site with a bandage or gauze dressing. Do not throw the PICC away. Your health care provider will need to check it.  Your PICC was tugged or pulled and has partially come out. Do not  push the PICC back in.  You cannot flush the PICC, it is hard to flush, or the PICC leaks around the insertion site when it is flushed.  You hear a "flushing" sound when the PICC is flushed.  You feel your  heart racing or skipping beats.  There is a hole or tear in the PICC.  You have swelling in the arm in which the PICC was inserted.  You have a red streak going up your arm from where the PICC was inserted. Summary  A peripherally inserted central catheter (PICC) is a long, thin, flexible tube (catheter) that is inserted into a vein in the upper arm.  The PICC is inserted using a sterile technique by a specially trained nurse or physician. Only a trained clinical professional should remove it.  Keep your PICC identification card with you at all times.  Avoid blood pressure checks on the arm in which the PICC is placed.  If cared for   properly, a PICC can remain in place for several months. Having a PICC can also allow a person to go home from the hospital sooner. This information is not intended to replace advice given to you by your health care provider. Make sure you discuss any questions you have with your health care provider. Document Revised: 06/22/2017 Document Reviewed: 08/12/2016 Elsevier Patient Education  2020 [employer]  

## 2019-07-28 ENCOUNTER — Other Ambulatory Visit: Payer: Self-pay | Admitting: *Deleted

## 2019-07-28 ENCOUNTER — Other Ambulatory Visit: Payer: Self-pay

## 2019-07-28 ENCOUNTER — Inpatient Hospital Stay: Payer: BC Managed Care – PPO | Attending: Oncology

## 2019-07-28 ENCOUNTER — Inpatient Hospital Stay: Payer: BC Managed Care – PPO

## 2019-07-28 DIAGNOSIS — M25511 Pain in right shoulder: Secondary | ICD-10-CM | POA: Diagnosis not present

## 2019-07-28 DIAGNOSIS — E039 Hypothyroidism, unspecified: Secondary | ICD-10-CM | POA: Insufficient documentation

## 2019-07-28 DIAGNOSIS — R5381 Other malaise: Secondary | ICD-10-CM | POA: Insufficient documentation

## 2019-07-28 DIAGNOSIS — R112 Nausea with vomiting, unspecified: Secondary | ICD-10-CM | POA: Insufficient documentation

## 2019-07-28 DIAGNOSIS — R251 Tremor, unspecified: Secondary | ICD-10-CM | POA: Diagnosis not present

## 2019-07-28 DIAGNOSIS — R410 Disorientation, unspecified: Secondary | ICD-10-CM | POA: Diagnosis not present

## 2019-07-28 DIAGNOSIS — D61818 Other pancytopenia: Secondary | ICD-10-CM | POA: Insufficient documentation

## 2019-07-28 DIAGNOSIS — C9110 Chronic lymphocytic leukemia of B-cell type not having achieved remission: Secondary | ICD-10-CM | POA: Diagnosis not present

## 2019-07-28 DIAGNOSIS — R197 Diarrhea, unspecified: Secondary | ICD-10-CM | POA: Diagnosis not present

## 2019-07-28 DIAGNOSIS — M1990 Unspecified osteoarthritis, unspecified site: Secondary | ICD-10-CM | POA: Diagnosis not present

## 2019-07-28 DIAGNOSIS — Z79899 Other long term (current) drug therapy: Secondary | ICD-10-CM | POA: Diagnosis not present

## 2019-07-28 DIAGNOSIS — D649 Anemia, unspecified: Secondary | ICD-10-CM

## 2019-07-28 DIAGNOSIS — Z95828 Presence of other vascular implants and grafts: Secondary | ICD-10-CM

## 2019-07-28 DIAGNOSIS — M25611 Stiffness of right shoulder, not elsewhere classified: Secondary | ICD-10-CM | POA: Insufficient documentation

## 2019-07-28 DIAGNOSIS — D696 Thrombocytopenia, unspecified: Secondary | ICD-10-CM

## 2019-07-28 LAB — CBC WITH DIFFERENTIAL (CANCER CENTER ONLY)
Abs Immature Granulocytes: 0.05 10*3/uL (ref 0.00–0.07)
Basophils Absolute: 0 10*3/uL (ref 0.0–0.1)
Basophils Relative: 0 %
Eosinophils Absolute: 0 10*3/uL (ref 0.0–0.5)
Eosinophils Relative: 1 %
HCT: 22.7 % — ABNORMAL LOW (ref 36.0–46.0)
Hemoglobin: 7.6 g/dL — ABNORMAL LOW (ref 12.0–15.0)
Immature Granulocytes: 1 %
Lymphocytes Relative: 84 %
Lymphs Abs: 3.1 10*3/uL (ref 0.7–4.0)
MCH: 28.1 pg (ref 26.0–34.0)
MCHC: 33.5 g/dL (ref 30.0–36.0)
MCV: 84.1 fL (ref 80.0–100.0)
Monocytes Absolute: 0.3 10*3/uL (ref 0.1–1.0)
Monocytes Relative: 7 %
Neutro Abs: 0.3 10*3/uL — CL (ref 1.7–7.7)
Neutrophils Relative %: 7 %
Platelet Count: 30 10*3/uL — ABNORMAL LOW (ref 150–400)
RBC: 2.7 MIL/uL — ABNORMAL LOW (ref 3.87–5.11)
RDW: 15.8 % — ABNORMAL HIGH (ref 11.5–15.5)
WBC Count: 3.8 10*3/uL — ABNORMAL LOW (ref 4.0–10.5)
nRBC: 0 % (ref 0.0–0.2)

## 2019-07-28 LAB — CMP (CANCER CENTER ONLY)
ALT: 19 U/L (ref 0–44)
AST: 14 U/L — ABNORMAL LOW (ref 15–41)
Albumin: 4 g/dL (ref 3.5–5.0)
Alkaline Phosphatase: 104 U/L (ref 38–126)
Anion gap: 7 (ref 5–15)
BUN: 21 mg/dL — ABNORMAL HIGH (ref 6–20)
CO2: 25 mmol/L (ref 22–32)
Calcium: 9.6 mg/dL (ref 8.9–10.3)
Chloride: 104 mmol/L (ref 98–111)
Creatinine: 1.41 mg/dL — ABNORMAL HIGH (ref 0.44–1.00)
GFR, Est AFR Am: 47 mL/min — ABNORMAL LOW (ref >60)
GFR, Estimated: 41 mL/min — ABNORMAL LOW (ref >60)
Glucose, Bld: 129 mg/dL — ABNORMAL HIGH (ref 70–99)
Potassium: 4.7 mmol/L (ref 3.5–5.1)
Sodium: 136 mmol/L (ref 135–145)
Total Bilirubin: 0.2 mg/dL — ABNORMAL LOW (ref 0.3–1.2)
Total Protein: 6.1 g/dL — ABNORMAL LOW (ref 6.5–8.1)

## 2019-07-28 LAB — PREPARE RBC (CROSSMATCH)

## 2019-07-28 LAB — SAMPLE TO BLOOD BANK

## 2019-07-28 MED ORDER — HEPARIN SOD (PORK) LOCK FLUSH 100 UNIT/ML IV SOLN
250.0000 [IU] | INTRAVENOUS | Status: AC | PRN
  Administered 2019-07-28: 250 [IU]
  Filled 2019-07-28: qty 5

## 2019-07-28 MED ORDER — SODIUM CHLORIDE 0.9% IV SOLUTION
250.0000 mL | Freq: Once | INTRAVENOUS | Status: AC
  Administered 2019-07-28: 250 mL via INTRAVENOUS
  Filled 2019-07-28: qty 250

## 2019-07-28 MED ORDER — SODIUM CHLORIDE 0.9% FLUSH
10.0000 mL | INTRAVENOUS | Status: DC | PRN
  Administered 2019-07-28: 13:00:00 10 mL
  Filled 2019-07-28: qty 10

## 2019-07-28 MED ORDER — SODIUM CHLORIDE 0.9% FLUSH
10.0000 mL | INTRAVENOUS | Status: AC | PRN
  Administered 2019-07-28: 10 mL
  Filled 2019-07-28: qty 10

## 2019-07-28 NOTE — Progress Notes (Signed)
Patient reports being symptomatic from Hgb 7.6 (lightheaded and weak w/shortness of breath w/exertion). Per Dr. Benay Spice: OK to transfuse 1 unit today. She reports her nausea is slightly improved and vaginal bleeding is stable. OB-GYN will not see her until 08/08/19. Blood bank confirmed they see orders and will prepare blood now. CRITICAL VALUE STICKER  CRITICAL VALUE: ANC 0.3  RECEIVER (on-site recipient of call):Adarrius Graeff, RN  DATE & TIME NOTIFIED: 07/28/19 @ 37  MESSENGER (representative from lab):Jan  MD NOTIFIED: Dr. Benay Spice  TIME OF NOTIFICATION:1359  RESPONSE: F/U as scheduled. Stable

## 2019-07-28 NOTE — Patient Instructions (Signed)

## 2019-07-29 ENCOUNTER — Institutional Professional Consult (permissible substitution): Admit: 2019-07-29 | Discharge: 2019-07-29 | Payer: PRIVATE HEALTH INSURANCE

## 2019-07-29 ENCOUNTER — Encounter
Admit: 2019-07-29 | Discharge: 2019-07-29 | Payer: PRIVATE HEALTH INSURANCE | Attending: Hematology & Oncology | Primary: Hematology & Oncology

## 2019-07-29 ENCOUNTER — Ambulatory Visit: Admit: 2019-07-29 | Discharge: 2019-07-29 | Payer: PRIVATE HEALTH INSURANCE

## 2019-07-29 ENCOUNTER — Encounter: Admit: 2019-07-29 | Discharge: 2019-07-29 | Payer: PRIVATE HEALTH INSURANCE

## 2019-07-29 DIAGNOSIS — Z6820 Body mass index (BMI) 20.0-20.9, adult: Secondary | ICD-10-CM | POA: Diagnosis not present

## 2019-07-29 DIAGNOSIS — H60392 Other infective otitis externa, left ear: Secondary | ICD-10-CM | POA: Diagnosis not present

## 2019-07-29 DIAGNOSIS — Z01810 Encounter for preprocedural cardiovascular examination: Secondary | ICD-10-CM | POA: Diagnosis not present

## 2019-07-29 DIAGNOSIS — Z7682 Awaiting organ transplant status: Secondary | ICD-10-CM | POA: Diagnosis not present

## 2019-07-29 LAB — BPAM RBC
Blood Product Expiration Date: 202102012359
ISSUE DATE / TIME: 202101041531
Unit Type and Rh: 5100

## 2019-07-29 LAB — TYPE AND SCREEN
ABO/RH(D): O POS
Antibody Screen: NEGATIVE
Unit division: 0

## 2019-07-29 LAB — COMPREHENSIVE METABOLIC PANEL
ALBUMIN: 3.9 g/dL (ref 3.5–5.0)
ALKALINE PHOSPHATASE: 104 U/L (ref 38–126)
ALT (SGPT): 17 U/L (ref <35)
ANION GAP: 4 mmol/L — ABNORMAL LOW (ref 7–15)
AST (SGOT): 21 U/L (ref 14–38)
BILIRUBIN TOTAL: 0.6 mg/dL (ref 0.0–1.2)
BLOOD UREA NITROGEN: 21 mg/dL (ref 7–21)
BUN / CREAT RATIO: 16
CHLORIDE: 103 mmol/L (ref 98–107)
CO2: 27 mmol/L (ref 22.0–30.0)
CREATININE: 1.35 mg/dL — ABNORMAL HIGH (ref 0.60–1.00)
EGFR CKD-EPI AA FEMALE: 50 mL/min/{1.73_m2} — ABNORMAL LOW (ref >=60)
EGFR CKD-EPI NON-AA FEMALE: 43 mL/min/{1.73_m2} — ABNORMAL LOW (ref >=60)
GLUCOSE RANDOM: 141 mg/dL (ref 70–179)
POTASSIUM: 4.7 mmol/L (ref 3.5–5.0)
PROTEIN TOTAL: 5.8 g/dL — ABNORMAL LOW (ref 6.5–8.3)
SODIUM: 134 mmol/L — ABNORMAL LOW (ref 135–145)

## 2019-07-29 LAB — CBC W/ AUTO DIFF
BASOPHILS ABSOLUTE COUNT: 0 10*9/L (ref 0.0–0.1)
BASOPHILS RELATIVE PERCENT: 0.3 %
EOSINOPHILS ABSOLUTE COUNT: 0 10*9/L (ref 0.0–0.4)
EOSINOPHILS RELATIVE PERCENT: 0.8 %
HEMATOCRIT: 27 % — ABNORMAL LOW (ref 36.0–46.0)
HEMOGLOBIN: 9.2 g/dL — ABNORMAL LOW (ref 12.0–16.0)
LARGE UNSTAINED CELLS: 5 % — ABNORMAL HIGH (ref 0–4)
LYMPHOCYTES ABSOLUTE COUNT: 2.4 10*9/L (ref 1.5–5.0)
LYMPHOCYTES RELATIVE PERCENT: 80.1 %
MEAN CORPUSCULAR HEMOGLOBIN CONC: 34 g/dL (ref 31.0–37.0)
MEAN CORPUSCULAR HEMOGLOBIN: 29.3 pg (ref 26.0–34.0)
MEAN CORPUSCULAR VOLUME: 86 fL (ref 80.0–100.0)
MEAN PLATELET VOLUME: 10.2 fL — ABNORMAL HIGH (ref 7.0–10.0)
MONOCYTES ABSOLUTE COUNT: 0.1 10*9/L — ABNORMAL LOW (ref 0.2–0.8)
MONOCYTES RELATIVE PERCENT: 4.6 %
NEUTROPHILS ABSOLUTE COUNT: 0.3 10*9/L — CL (ref 2.0–7.5)
NEUTROPHILS RELATIVE PERCENT: 9.5 %
PLATELET COUNT: 31 10*9/L — ABNORMAL LOW (ref 150–440)
RED BLOOD CELL COUNT: 3.14 10*12/L — ABNORMAL LOW (ref 4.00–5.20)
RED CELL DISTRIBUTION WIDTH: 18.3 % — ABNORMAL HIGH (ref 12.0–15.0)

## 2019-07-29 LAB — URINALYSIS
BILIRUBIN UA: NEGATIVE
GLUCOSE UA: NEGATIVE
HYALINE CASTS: 30 /LPF — ABNORMAL HIGH (ref 0–1)
LEUKOCYTE ESTERASE UA: NEGATIVE
PH UA: 5 (ref 5.0–9.0)
PROTEIN UA: 30 — AB
RBC UA: 24 /HPF — ABNORMAL HIGH (ref <=4)
SPECIFIC GRAVITY UA: 1.02 (ref 1.003–1.030)
SQUAMOUS EPITHELIAL: 3 /HPF (ref 0–5)
UROBILINOGEN UA: 0.2
WBC UA: 5 /HPF (ref 0–5)

## 2019-07-29 LAB — ANISOCYTOSIS

## 2019-07-29 LAB — SMEAR REVIEW

## 2019-07-29 LAB — TOXOPLASMA GONDII IGG: Lab: NEGATIVE

## 2019-07-29 LAB — HERPES SIMPLEX VIRUS 1 IGG: Lab: POSITIVE — AB

## 2019-07-29 LAB — MAGNESIUM: Magnesium:MCnc:Pt:Ser/Plas:Qn:: 2

## 2019-07-29 LAB — TOXOPLASMA GONDII ANTIBODY, IGG: TOXOPLASMA GONDII IGG: NEGATIVE

## 2019-07-29 LAB — HEPARIN CORRELATION: Lab: 0.2

## 2019-07-29 LAB — VARICELLA ZOSTER IGG: Varicella zoster virus Ab.IgG:PrThr:Pt:Ser:Ord:: POSITIVE

## 2019-07-29 LAB — EPSTEIN-BARR VCA IGM ANTIBODY: Lab: NEGATIVE

## 2019-07-29 LAB — INR: Coagulation tissue factor induced.INR:RelTime:Pt:PPP:Qn:Coag: 1.59

## 2019-07-29 LAB — EPSTEIN-BARR VIRUS ANTIBODY PANEL
EPSTEIN-BARR VCA IGG ANTIBODY: POSITIVE — AB
EPSTEIN-BARR VCA IGM ANTIBODY: NEGATIVE

## 2019-07-29 LAB — CMV IGG: Lab: NEGATIVE

## 2019-07-29 LAB — ALKALINE PHOSPHATASE: Alkaline phosphatase:CCnc:Pt:Ser/Plas:Qn:: 104

## 2019-07-29 LAB — HSV ANTIBODIES, IGG: HERPES SIMPLEX VIRUS 2 IGG: POSITIVE — AB

## 2019-07-29 LAB — PROTEIN UA: Protein:MCnc:Pt:Urine:Qn:Test strip: 30 — AB

## 2019-07-29 LAB — APTT: APTT: 35.6 s (ref 25.3–37.1)

## 2019-07-29 LAB — LACTATE DEHYDROGENASE: Lactate dehydrogenase:CCnc:Pt:Ser/Plas:Qn:Reaction: pyruvate to lactate: 518

## 2019-07-29 LAB — RUBELLA IGG SCREEN: Rubella virus Ab.IgG:PrThr:Pt:Ser:Ord:: POSITIVE

## 2019-07-29 MED ORDER — AMOXICILLIN 875 MG-POTASSIUM CLAVULANATE 125 MG TABLET
ORAL_TABLET | Freq: Two times a day (BID) | ORAL | 0 refills | 14.00000 days | Status: CP
Start: 2019-07-29 — End: 2019-08-05

## 2019-07-29 NOTE — Unmapped (Signed)
TUG completed at 8.48 seconds.       UA and VRE collected and sent to core lab. Rationale for collecting these samples explained. Written material given. Patient verbalized understanding. Time spent 5 minutes.

## 2019-07-29 NOTE — Unmapped (Signed)
[facility] Follow-up: virtual visit      Patient Name: Susan Kane  Patient Age: 59 y.o.  Encounter Date: 07/29/2019    Primary Care Provider:  Thora Lance, MD    Referring Physician:  Lillia Mountain, MD  [address]  [place],  [place] [address]    Chief complaint: Here for follow up of : CLL now with aplastic anemia (possibly 2/t acalabrutinib)    Assessment:  Susan Kane is a 59 y.o. year old female with history of CLL initially treated with ibrutinib. Unfortunately she was not able to tolerate this due to severe arthralgias. Therapy was changed to acalabrutinib, which was much better tolerated with no recurrence of arthralgias, but with progressive bone marrow failure as evidenced by ongoing transfusion dependence, worsening thrombocytopenia and neutropenia.   ??  Reviewed pt's case with Dr. [name] and also presented at our multidisciplinary hem malignancy conference on multiple occasions - while not typical, her course was concerning for development of aplastic anemia possibly due to the acalabrutinib.     Acalabrutinib was discontinued and she was placed on Promacta as well as corticosteroids in addition to a x1 dose of Rituximab. This resulted in a brief but not durable response.     BMBx was performed on 8/14 which demonstrated  A hypocellular bone marrow (20%) with marked reduction of maturing hematopoietic elements. Numerous lymphoid aggregates, consistent with involvement by chronic lymphocytic leukemia, representing ~50% of marrow cellularity by flow cytometry. No clonal abnormalities to suggest MDS.    Proceeded with ATG-cyclosporine (8/26=Day 1) and she tolerated the ATG with just a mild reaction on day 1.    However she has experienced significant n/v while on cyclosporine and her levels have been intermittently subtherapeutic despite dose adjustments by our pharmacist. In addition, she remains quite neutropenic and thrombocytopenic. Changed to tacrolimus as a result (and also because would be helpful to know she can tolerate this drug if we go BMT route) and pt is doing much better from a N/V perspective. Also added back eltrombopag on 12/7 in event the triplet is helpful for her (had not added given lack of prior response to it as monotherapy).    Counts are a bit better but remain suboptimal. Discussed would continue to be worked up for BMT and to proceed that route if matters worsen, or if don't see further improvement. If needs bridging CLL therapy to eradicate remaining CLL prior to BMT, will likely respond well to venetoclax but delaying starting this as this would likely markedly worsen her already low counts.      Plan and Recommendations:   1. Aplastic Anemia  - cont tacrolimus, weekly levels (pt with tremor likely 2/t tacro, want to make sure doesn't go supratherapeutic)  - cont posaconazole 300mg  daily - plan to continue even if ANC >0.5 with goal of maintaining cyclosporine level stability  - Continue levofloxacin ppx while ANC <0.5   - Continue bactrim and valtrex (usually on for about a year after ATG, can check CD4 level in several months to help determine when ok to stop)  - defer to PharmD on tacro monitoring   - appreciate BMT team input, pt got organ function testing today  - rtc in 3 weeks    2. Iron overload  - exjade for iron overload     3. CLL  - hold CLL directed therapy but discussed may re-initiate prior to BMT to debulk remaining disease leading  up to BMT if we go BMT route (would likely use venetoclax but this will likely markedly worsen her counts so no rush to start)    4. Nausea: improved on tacrolimus    5. AKI  - appears stable to improved    6. Possible otitis externa  - add 7 days augmentin to levaquin (symptoms started after being on levaquin a week)    07/29/19    History of Present Illness:  Oncology History Overview Note   CLL:    Summary as per Dr. Kalman Drape most recent note with minor edits/additions from my review of records    1.??CLL?????diagnosed in August 2010, flow cytometry consistent with CLL  ?? Enlarged left??inguinal lymph node January 2019,??small??neck/axillary nodes and palpable splenomegaly 09/11/2017  ?? CTs??on 09/17/2017-3 cm necrotic appearing lymph node in the left inguinal region, borderline enlarged pelvic/retroperitoneal, chest, and axillary nodes. ??Mild splenomegaly.  ?? Ultrasound-guided biopsy of the left inguinal lymph node 09/18/2017, slightly purulent fluid aspirated, core biopsy is consistent with an atypical lymphoid proliferation???extensive necrosis with surrounding epithelioid histiocytes, limited intact lymphoid tissue involved with CLL  ?? Incisional biopsy of a necrotic/purulent left inguinal lymph node on 10/01/2017???extensive necrosis with granulomatous inflammation, small amount of viable lymphoid tissue involved with CLL, AFB and fungal stains negative  ?? Peripheral blood FISH analysis 02/05/2018??? +deletion 13q14, no evidence of p53 (17p13) deletion, no evidence of 11q22??deletion  - normal karyotype (46,XX)  ?? Bone marrow biopsy 02/26/2018???hypercellular marrow with extensive involvement by CLL, lymphocytes represent??85% of all cells  ?? Ibrutinib initiated 04/03/2018  ?? Ibrutinib placed on hold 04/11/2018 due to onset of arthralgias  ?? Ibrutinib resumed 04/16/2018, discontinued 04/25/2018 secondary to severe arthralgias/arthritis  ?? Ibrutinib resumed at a dose of 140 mg daily 05/03/2018  ?? Ibrutinib dose adjusted to 140 mg alternating with 280 mg 06/25/2018  ?? 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 ??Augmentin prescribed.  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    Baseline BM bx reviewed at [facility]  Final Diagnosis   (Outside Case #:  [id number], dated 02/26/2018)  Bone marrow, aspiration and biopsy  -  Hypercellular bone marrow (80%) with extensive involvement by chronic lymphocytic leukemia (87% lymphocytes by manual aspirate differential)  (See Comment)  -  By outside report, cytogenetic results are normal     Karyotype: 23, XX and FISH with 13q del but no 11q or 17p del    Repeat BM bx: (done after being off ibrutinib x1 month)  80% involvement by CLL    08/13/18: Presents to [facility] to discuss plan of care     Chronic lymphocytic leukemia (CLL), B-cell (CMS-HCC)   07/12/2018 Initial Diagnosis    Chronic lymphocytic leukemia (CLL), B-cell (CMS-HCC)     08/26/2018 -  Chemotherapy    Acalabrutinib started (approximate date)     11/14/2018 -  Chemotherapy    Acalabrutinib discontinued due to progressive bone marrow failure     12/06/2018 -  Chemotherapy    Ritux x1 given due to CLL still making up majority of BM cellularity (though in the setting of near aplasia of rest of TLH)     03/19/2019 -  Chemotherapy    Equine ATG / CsA for aplastic anemia (suspect 2/t acalabrutinib)  Interval History:  She thinks has done better with the tacro from a n/v perspective - only emesis x2 in past few weeks.     Has a tremor that is similar to when on CsA, suspect related to tacro.    Energy is ok - last RBC txfn on 12/24 and plts on 12/19 and counts holding pretty well.    Left ear bothering her, mild pain.    Otherwise, she denies new constitutional symptoms such as anorexia, weight loss, night sweats or unexplained fevers.  Furthermore, she denies recurrent or unexplained intercurrent infections, dyspnea on exertion, lightheadedness, palpitations or chest pain.  There have been no new or unexplained pains or self-identified masses, swelling or enlarged lymph nodes.    Past Medical, Surgical and Family History were reviewed and pertinent updates were made in the Electronic Medical Record    Review of Systems:  Other than as reported above in the interim history, the balance of a full 12-system review was performed and unremarkable.    ECOG Performance Status: 1    Past Medical History:  Past Medical History:   Diagnosis Date   ??? Anxiety    ??? Depression    ??? Hypothyroid        Medications:    Current Outpatient Medications   Medication Sig Dispense Refill   ??? acetaminophen (TYLENOL) 325 MG tablet Take 650 mg by mouth every six (6) hours as needed for pain.     ??? ALPRAZolam (XANAX) 0.25 MG tablet 0.25 mg daily as needed.      ??? cyclobenzaprine (FLEXERIL) 10 MG tablet TAKE 1 TABLET BY MOUTH 2 TIMES DAILY AS NEEDED FOR MUSCLE SPASMS. DO NOT TAKE WITH XANAX OR AMBIEN     ??? cycloSPORINE modified (NEORAL) 25 MG capsule cyclosporine modified 25 mg capsule     ??? deferasirox (EXJADE) 500 MG disintegrating tablet Take 1,000 mg by mouth.     ??? eltrombopag (PROMACTA) 50 MG tablet Take 150 mg by mouth.     ??? folic acid (FOLVITE) 1 MG tablet Take 1 mg by mouth daily.      ??? levoFLOXacin (LEVAQUIN) 500 MG tablet Take 1 tablet (500 mg total) by mouth daily. 30 tablet 2   ??? levonorgestrel (MIRENA) 20 mcg/24 hours (5 yrs) 52 mg IUD 1 each by Intrauterine route.     ??? levothyroxine (SYNTHROID) 100 MCG tablet Take 100 mcg by mouth daily.      ??? lithium (LITHOBID) 300 MG ER tablet Take 600 mg by mouth at bedtime.     ??? LORazepam (ATIVAN) 0.5 MG tablet lorazepam 0.5 mg tablet   TAKE 1 TABLET BY MOUTH TWICE A DAY PRIOR TO CYCLOSPORINE DOSE     ??? ondansetron (ZOFRAN-ODT) 8 MG disintegrating tablet Take 8 mg by mouth every eight (8) hours as needed for nausea.     ??? posaconazole (NOXAFIL) 100 mg TbEC delayed released tablet Take 300 mg by mouth daily. 90 tablet 2   ??? prochlorperazine (COMPAZINE) 5 MG tablet Take 5 mg by mouth every six (6) hours as needed.      ??? tacrolimus (PROGRAF) 1 MG capsule Take 1 capsule (1 mg total) by mouth two (2) times a day. 60 capsule 2   ??? TRINTELLIX 20 mg tablet Take 20 mg by mouth daily.      ??? valACYclovir (VALTREX) 500 MG tablet Take 500 mg by mouth daily.      ??? zolpidem (AMBIEN) 10 mg tablet Take 10 mg by mouth  nightly.      ??? SUMAtriptan (IMITREX) 20 mg/actuation nasal spray USE 1 SPRAY IN THE NOSE DAILY AS NEEDED FOR MIGRAINE       No current facility-administered medications for this visit.        Vital Signs:  BSA: 1.63 meters squared  Vitals:    07/29/19 1506   BP: 103/53   Pulse: 81   Resp: 14   Temp: 36.4 ??C (97.6 ??F)   SpO2: 100%            Physical Exam:  Vitals:    07/29/19 1506   BP: 103/53   Pulse: 81   Resp: 14   Temp: 36.4 ??C (97.6 ??F)   SpO2: 100%       General: Resting in no apparent distress  HEENT:  Pupils are equal, round and reactive to light.  There is no scleral icterus and no conjunctival injection.  Right tympanic membrane with small amount of fluid. No thyromegaly.  No jugular venous distention.    Lymph node exam:  No lymphadenopathy in the occipital, auricular, anterior/posterior cervical, submental, supraclavicular, axillary, inguinal regions.  Heart:  Regular rate and rhythm.  Normal S1 and S2 without S3 or S4.  There are no murmurs, gallops or rubs.  Lungs:  Breathing is unlabored and patient is speaking full sentences with ease.  No stridor.  Auscultation of lung fields reveals normal air movement without rales, rhonchi or crackles.    Gastrointestinal:  No distention or pain on palpation.  Bowel sounds are present and normal in quality.  There is no palpable hepatomegaly or splenomegaly.  No palpable masses.  Skin:  No rashes, petechiae or purpura.  No areas of skin breakdown.  Musculoskeletal:  There are no grossly-evident joint effusions or deformities.  Range of motion about the shoulder, elbow, hips and knees is grossly normal.  There is no pain on palpation of the spinous processes of the cervical, thoracic or lumbar vertebral bodies.  Psychiatric:  Alert and oriented to person, place, time and situation.  Range of affect is appropriate.    Neurologic:  CN II-XII are normal and symmetric. Gait is normal. Otherwise neurologic exam is grossly non-focal.  Extremities:  Appear well-perfused, with radial and dorsalis pedis pulses 2+.  There is no clubbing, edema or cyanosis.      Relevant Laboratory, radiology and pathology results:  Results for orders placed or performed in visit on 07/29/19   Toxoplasma gondii Antibody, IgG   Result Value Ref Range    Toxoplasma Gondii IgG Negative Negative   Varicella zoster antibody, IgG   Result Value Ref Range    Varicella IgG Positive    CMV IgG   Result Value Ref Range    CMV IGG Negative Negative   HSV Antibody, IgG   Result Value Ref Range    HSV 1 IgG Positive (A) Negative    HSV 2 IgG Positive (A) Negative   Epstein-Barr Virus (EBV) Antibody Panel   Result Value Ref Range    EBV VCA IgG Antibody Positive (A) Negative    EBV VCA IgM Antibody Negative Negative    EBV Nuclear Ag IgG Antibody Positive (A) Negative   Rubella Antibody, IgG   Result Value Ref Range    Rubella IgG Scr Positive    Urinalysis   Result Value Ref Range    Color, UA Yellow     Clarity, UA Hazy     Specific Gravity, UA 1.020 1.003 - 1.030  pH, UA 5.0 5.0 - 9.0    Leukocyte Esterase, UA Negative Negative    Nitrite, UA Negative Negative    Protein, UA 30 mg/dL (A) Negative    Glucose, UA Negative Negative    Ketones, UA Trace (A) Negative    Urobilinogen, UA 0.2 mg/dL 0.2 mg/dL, 1.0 mg/dL    Bilirubin, UA Negative Negative    Blood, UA Large (A) Negative    RBC, UA 24 (H) <=4 /HPF    WBC, UA 5 0 - 5 /HPF    Squam Epithel, UA 3 0 - 5 /HPF    Bacteria, UA Rare (A) None Seen /HPF    Hyaline Casts, UA 30 (H) 0 - 1 /LPF    Mucus, UA Few (A) None Seen /HPF   aPTT   Result Value Ref Range    APTT 35.6 25.3 - 37.1 sec    Heparin Correlation 0.2    PT-INR   Result Value Ref Range    PT 18.5 (H) 10.5 - 13.5 sec    INR 1.59    Lactate dehydrogenase   Result Value Ref Range    LDH 518 338 - 610 U/L Magnesium Level   Result Value Ref Range    Magnesium 2.0 1.6 - 2.2 mg/dL   Comprehensive Metabolic Panel   Result Value Ref Range    Sodium 134 (L) 135 - 145 mmol/L    Potassium 4.7 3.5 - 5.0 mmol/L    Chloride 103 98 - 107 mmol/L    Anion Gap 4 (L) 7 - 15 mmol/L    CO2 27.0 22.0 - 30.0 mmol/L    BUN 21 7 - 21 mg/dL    Creatinine 1.61 (H) 0.60 - 1.00 mg/dL    BUN/Creatinine Ratio 16     EGFR CKD-EPI Non-African American, Female 43 (L) >=60 mL/min/1.25m2    EGFR CKD-EPI African American, Female 50 (L) >=60 mL/min/1.46m2    Glucose 141 70 - 179 mg/dL    Calcium 9.7 8.5 - 09.6 mg/dL    Albumin 3.9 3.5 - 5.0 g/dL    Total Protein 5.8 (L) 6.5 - 8.3 g/dL    Total Bilirubin 0.6 0.0 - 1.2 mg/dL    AST 21 14 - 38 U/L    ALT 17 <35 U/L    Alkaline Phosphatase 104 38 - 126 U/L   Type and Screen   Result Value Ref Range    ABO Grouping O POS     Antibody Screen NEG    CBC w/ Differential   Result Value Ref Range    WBC 3.0 (L) 4.5 - 11.0 10*9/L    RBC 3.14 (L) 4.00 - 5.20 10*12/L    HGB 9.2 (L) 12.0 - 16.0 g/dL    HCT 04.5 (L) 40.9 - 46.0 %    MCV 86.0 80.0 - 100.0 fL    MCH 29.3 26.0 - 34.0 pg    MCHC 34.0 31.0 - 37.0 g/dL    RDW 81.1 (H) 91.4 - 15.0 %    MPV 10.2 (H) 7.0 - 10.0 fL    Platelet 31 (L) 150 - 440 10*9/L    Neutrophils % 9.5 %    Lymphocytes % 80.1 %    Monocytes % 4.6 %    Eosinophils % 0.8 %    Basophils % 0.3 %    Absolute Neutrophils 0.3 (LL) 2.0 - 7.5 10*9/L    Absolute Lymphocytes 2.4 1.5 - 5.0 10*9/L    Absolute Monocytes 0.1 (L) 0.2 -  0.8 10*9/L    Absolute Eosinophils 0.0 0.0 - 0.4 10*9/L    Absolute Basophils 0.0 0.0 - 0.1 10*9/L    Large Unstained Cells 5 (H) 0 - 4 %    Microcytosis Slight (A) Not Present    Anisocytosis Moderate (A) Not Present   Morphology Review   Result Value Ref Range    Smear Review Comments See Comment (A) Undefined   DNA Fingerprinting, Pre-Transplant Assay   Result Value Ref Range    Collection Collected

## 2019-07-29 NOTE — Unmapped (Signed)
Please return in 3 weeks for a follow up visit and labs.    Please call us if you experience:    1. Nausea or vomiting not controlled by nausea medicines  2. Fever of 100.5 F or higher, shaking chills, drenching night sweats.  3. Uncontrolled pain  4. Any rapidly enlarging lymph node or mass  5. Unintentional weight loss  6. Any other concerning symptom     MyChart Messages  For your safety and best care, please DO NOT use MyChart messages to report symptoms. (Symptoms should be reported by calling the nurse triage line). Please use MyChart for non-urgent matters such as general questions, non-urgent prescription refills, or non-urgent scheduling issues.     ?? Please do not use MyChart for URGENT messages, as messages are only checked during regular business hours.     ?? Please note that MyChart messages may be routed a central pool and one of your provider???s team members will get back to you.  - Expect up to 3 business days for response     If you have any other questions, please do not hesitate to contact us.    Nurse Navigator: Milinda Antis, RN    Nurse Practitioner: [name]    For health related questions Monday through Friday 8 AM??? 5 PM : please call the office at [phone] and ask to speak with a nurse.  For appointment changes call: Main Clinic [phone].  Toll free number is [phone].    On Nights, Weekends and Holidays:  Call [phone] and ask for the adult hematologist/oncologist on call.      [facility]  [address]  [place], [place] [address]  [email or web address]    Results for orders placed or performed in visit on 07/29/19   Urinalysis   Result Value Ref Range    Color, UA Yellow     Clarity, UA Hazy     Specific Gravity, UA 1.020 1.003 - 1.030    pH, UA 5.0 5.0 - 9.0    Leukocyte Esterase, UA Negative Negative    Nitrite, UA Negative Negative    Protein, UA 30 mg/dL (A) Negative    Glucose, UA Negative Negative    Ketones, UA Trace (A) Negative Urobilinogen, UA 0.2 mg/dL 0.2 mg/dL, 1.0 mg/dL    Bilirubin, UA Negative Negative    Blood, UA Large (A) Negative    RBC, UA 24 (H) <=4 /HPF    WBC, UA 5 0 - 5 /HPF    Squam Epithel, UA 3 0 - 5 /HPF    Bacteria, UA Rare (A) None Seen /HPF    Hyaline Casts, UA 30 (H) 0 - 1 /LPF    Mucus, UA Few (A) None Seen /HPF   aPTT   Result Value Ref Range    APTT 35.6 25.3 - 37.1 sec    Heparin Correlation 0.2    PT-INR   Result Value Ref Range    PT 18.5 (H) 10.5 - 13.5 sec    INR 1.59    Lactate dehydrogenase   Result Value Ref Range    LDH 518 338 - 610 U/L   Magnesium Level   Result Value Ref Range    Magnesium 2.0 1.6 - 2.2 mg/dL   Comprehensive Metabolic Panel   Result Value Ref Range    Sodium 134 (L) 135 - 145 mmol/L    Potassium 4.7 3.5 - 5.0 mmol/L    Chloride 103 98 - 107 mmol/L  Anion Gap 4 (L) 7 - 15 mmol/L    CO2 27.0 22.0 - 30.0 mmol/L    BUN 21 7 - 21 mg/dL    Creatinine 1.61 (H) 0.60 - 1.00 mg/dL    BUN/Creatinine Ratio 16     EGFR CKD-EPI Non-African American, Female 43 (L) >=60 mL/min/1.25m2    EGFR CKD-EPI African American, Female 50 (L) >=60 mL/min/1.5m2    Glucose 141 70 - 179 mg/dL    Calcium 9.7 8.5 - 09.6 mg/dL    Albumin 3.9 3.5 - 5.0 g/dL    Total Protein 5.8 (L) 6.5 - 8.3 g/dL    Total Bilirubin 0.6 0.0 - 1.2 mg/dL    AST 21 14 - 38 U/L    ALT 17 <35 U/L    Alkaline Phosphatase 104 38 - 126 U/L   Type and Screen   Result Value Ref Range    ABO Grouping O POS     Antibody Screen NEG    CBC w/ Differential   Result Value Ref Range    WBC 3.0 (L) 4.5 - 11.0 10*9/L    RBC 3.14 (L) 4.00 - 5.20 10*12/L    HGB 9.2 (L) 12.0 - 16.0 g/dL    HCT 04.5 (L) 40.9 - 46.0 %    MCV 86.0 80.0 - 100.0 fL    MCH 29.3 26.0 - 34.0 pg    MCHC 34.0 31.0 - 37.0 g/dL    RDW 81.1 (H) 91.4 - 15.0 %    MPV 10.2 (H) 7.0 - 10.0 fL    Platelet 31 (L) 150 - 440 10*9/L    Neutrophils % 9.5 %    Lymphocytes % 80.1 %    Monocytes % 4.6 %    Eosinophils % 0.8 %    Basophils % 0.3 %    Absolute Neutrophils 0.3 (LL) 2.0 - 7.5 10*9/L Absolute Lymphocytes 2.4 1.5 - 5.0 10*9/L    Absolute Monocytes 0.1 (L) 0.2 - 0.8 10*9/L    Absolute Eosinophils 0.0 0.0 - 0.4 10*9/L    Absolute Basophils 0.0 0.0 - 0.1 10*9/L    Large Unstained Cells 5 (H) 0 - 4 %    Microcytosis Slight (A) Not Present    Anisocytosis Moderate (A) Not Present   Morphology Review   Result Value Ref Range    Smear Review Comments See Comment (A) Undefined   DNA Fingerprinting, Pre-Transplant Assay   Result Value Ref Range    Collection Collected

## 2019-07-29 NOTE — Unmapped (Signed)
Bone Marrow Transplant Research Note:  Date: 07/29/19      Per discussion with Dr. Lanae Boast, [name] [facility] is potentially eligible for the research studies listed below. A brief overview of the studies was given and the patient was provided copies of the consents. If the patient is later determined to be eligible, a more descriptive presentation of the study will be given prior to obtaining consent.    BMT CTN 1703/1801 A Randomized, Multicenter Phase III Trial of Tacrolimus/Methotrexate versus Post-Transplant Cyclophosphamide/Tacrolimus/Mycophenolate Mofetil in Non-Myeloablative/ Reduced Intensity Conditioning Allogeneic Peripheral Blood Stem Cell Transplantation.     BMT CTN 1704 Composite Health Assessment Model for Older Adults: Applying Pre-transplant Comorbidity, Geriatric Assessment, and Biomarkers to Predict Non-Relapse Mortality after Allogeneic Transplantation (Depending on age at time of HCT)

## 2019-07-30 ENCOUNTER — Encounter: Payer: Self-pay | Admitting: *Deleted

## 2019-07-30 LAB — TACROLIMUS LEVEL: Tacrolimus (FK506) - LabCorp: 14.3 ng/mL (ref 2.0–20.0)

## 2019-07-30 LAB — HEPATITIS B SURFACE ANTIGEN
HEPATITIS B SURFACE ANTIGEN: NONREACTIVE
Hepatitis B virus surface Ag:PrThr:Pt:Ser:Ord:: NONREACTIVE

## 2019-07-30 LAB — HIV ANTIGEN/ANTIBODY COMBO: HIV 1+2 Ab+HIV1 p24 Ag:PrThr:Pt:Ser/Plas:Ord:IA: NONREACTIVE

## 2019-07-30 LAB — HEPATITIS C ANTIBODY: Hepatitis C virus Ab:PrThr:Pt:Ser:Ord:: NONREACTIVE

## 2019-07-30 LAB — SYPHILIS RPR SCREEN: Reagin Ab:PrThr:Pt:Ser:Ord:RPR: NONREACTIVE

## 2019-07-30 LAB — HEPATITIS B CORE IGM ANTIBODY: Hepatitis B virus core Ab.IgM:PrThr:Pt:Ser:Ord:: NONREACTIVE

## 2019-07-30 LAB — HEPATITIS B CORE TOTAL ANTIBODY: Hepatitis B virus core Ab:PrThr:Pt:Ser/Plas:Ord:IA: NONREACTIVE

## 2019-07-30 MED FILL — PROMACTA 50 MG TABLET: 50 | 30 days supply | Qty: 90 | Fill #1

## 2019-07-30 NOTE — Progress Notes (Addendum)
Faxed labs from 07/28/19 with Tacrolimus level to [facility] [phone]. Delynn FlavinJoellen Jersey, PharmD. Pharmacist called back to inquire if the level was a true trough level.  Attempted to reach patient-no answer and mailbox full. Called husband to inquire if she held dose on Monday before lab draw and he is unsure. He will ask her tonight and let Dr. Benay Spice know via text.

## 2019-07-31 DIAGNOSIS — Z7289 Other problems related to lifestyle: Secondary | ICD-10-CM | POA: Diagnosis not present

## 2019-07-31 DIAGNOSIS — Z856 Personal history of leukemia: Secondary | ICD-10-CM | POA: Diagnosis not present

## 2019-07-31 DIAGNOSIS — H903 Sensorineural hearing loss, bilateral: Secondary | ICD-10-CM | POA: Diagnosis not present

## 2019-07-31 MED FILL — DEFERASIROX 500 MG TBSO: 500 | 30 days supply | Qty: 60 | Fill #1

## 2019-07-31 NOTE — Unmapped (Signed)
On 12/15, [name] was switched from cyclosporine to tacrolimus. This was mainly due to the limitations of nausea/vomiting on her levels and quality of life. If she were to get a transplant she would need to tolerate immunosuppression therapy, and so therefore the decision was made to switch her to tacrolimus.     She was started on 1 mg BID. This dose was determined based off a target dose of 0.03 mg/kg BID with a 50% empiric dose reduction for the posaconazole interaction. Patient's tacrolimus level from 1/4 came back 14.3 ng/mL (goal 5-15). This is nearly supratherapeutic, and has more than doubled in 1 week. I am confirming with [facility] RN if this is a true trough or if she took her dose prior. Her SCr is bumped back up to 1.41. Her LFTs are WNL. CBC is stable with low counts (ANC 0.3, PLT 30, Hgb 7.6). Of note, she is currently on Exjade 1000 mg daily and Promacta 150 mg daily.     1/7 update: patient did NOT hold AM dose prior to trough. Plan to continue current dose of 1 mg BID and check again as scheduled next Monday.     She should remain on ppx with posaconazole indefinitely to avoid stopping/starting which impacts tacrolimus metabolism and levels. She should continue other ppx of levofloxacin while ANC < 0.5, and valtrex and Bactrim indefinitely. Should continue to get weekly tacrolimus troughs locally. I will continue to follow.       Manfred Arch, PharmD, BCOP, CPP  Pager: [phone]

## 2019-07-31 NOTE — Progress Notes (Signed)
Husband informed Dr. Benay Spice that Meagan Walsh did take her Tacrolimus before her blood draw on Monday. Notified Katie, PharmD at [facility].

## 2019-08-01 ENCOUNTER — Other Ambulatory Visit: Payer: Self-pay | Admitting: Oncology

## 2019-08-01 ENCOUNTER — Telehealth: Payer: Self-pay | Admitting: Oncology

## 2019-08-01 ENCOUNTER — Inpatient Hospital Stay: Payer: BC Managed Care – PPO

## 2019-08-01 ENCOUNTER — Inpatient Hospital Stay: Payer: BC Managed Care – PPO | Admitting: Oncology

## 2019-08-01 ENCOUNTER — Telehealth: Payer: Self-pay | Admitting: *Deleted

## 2019-08-01 DIAGNOSIS — C9110 Chronic lymphocytic leukemia of B-cell type not having achieved remission: Secondary | ICD-10-CM

## 2019-08-01 LAB — HTLV I/II ANTIBODIES: HTLV I+II Ab:PrThr:Pt:Ser:Ord:: NEGATIVE

## 2019-08-01 NOTE — Telephone Encounter (Signed)
Scheduled appt per 1/08 sch message - pt is aware of appt date and time

## 2019-08-01 NOTE — Telephone Encounter (Signed)
Called patient and reminded her to hold her tacrolimus Monday morning until after lab is drawn.

## 2019-08-04 ENCOUNTER — Other Ambulatory Visit: Payer: Self-pay

## 2019-08-04 ENCOUNTER — Ambulatory Visit: Payer: BC Managed Care – PPO | Admitting: Nutrition

## 2019-08-04 ENCOUNTER — Inpatient Hospital Stay (HOSPITAL_BASED_OUTPATIENT_CLINIC_OR_DEPARTMENT_OTHER): Payer: BC Managed Care – PPO | Admitting: Oncology

## 2019-08-04 ENCOUNTER — Inpatient Hospital Stay: Payer: BC Managed Care – PPO

## 2019-08-04 VITALS — BP 96/62 | HR 92 | Temp 98.3°F | Resp 15 | Ht 66.0 in | Wt 123.4 lb

## 2019-08-04 DIAGNOSIS — D649 Anemia, unspecified: Secondary | ICD-10-CM

## 2019-08-04 DIAGNOSIS — D696 Thrombocytopenia, unspecified: Secondary | ICD-10-CM

## 2019-08-04 DIAGNOSIS — M1990 Unspecified osteoarthritis, unspecified site: Secondary | ICD-10-CM | POA: Diagnosis not present

## 2019-08-04 DIAGNOSIS — R410 Disorientation, unspecified: Secondary | ICD-10-CM | POA: Diagnosis not present

## 2019-08-04 DIAGNOSIS — R197 Diarrhea, unspecified: Secondary | ICD-10-CM | POA: Diagnosis not present

## 2019-08-04 DIAGNOSIS — M25511 Pain in right shoulder: Secondary | ICD-10-CM | POA: Diagnosis not present

## 2019-08-04 DIAGNOSIS — M25611 Stiffness of right shoulder, not elsewhere classified: Secondary | ICD-10-CM | POA: Diagnosis not present

## 2019-08-04 DIAGNOSIS — D61818 Other pancytopenia: Secondary | ICD-10-CM | POA: Diagnosis not present

## 2019-08-04 DIAGNOSIS — C9110 Chronic lymphocytic leukemia of B-cell type not having achieved remission: Secondary | ICD-10-CM

## 2019-08-04 DIAGNOSIS — Z95828 Presence of other vascular implants and grafts: Secondary | ICD-10-CM

## 2019-08-04 DIAGNOSIS — Z79899 Other long term (current) drug therapy: Secondary | ICD-10-CM | POA: Diagnosis not present

## 2019-08-04 DIAGNOSIS — R5381 Other malaise: Secondary | ICD-10-CM | POA: Diagnosis not present

## 2019-08-04 DIAGNOSIS — R251 Tremor, unspecified: Secondary | ICD-10-CM | POA: Diagnosis not present

## 2019-08-04 DIAGNOSIS — R112 Nausea with vomiting, unspecified: Secondary | ICD-10-CM | POA: Diagnosis not present

## 2019-08-04 DIAGNOSIS — E039 Hypothyroidism, unspecified: Secondary | ICD-10-CM | POA: Diagnosis not present

## 2019-08-04 LAB — CBC WITH DIFFERENTIAL (CANCER CENTER ONLY)
Abs Immature Granulocytes: 0.01 10*3/uL (ref 0.00–0.07)
Basophils Absolute: 0 10*3/uL (ref 0.0–0.1)
Basophils Relative: 0 %
Eosinophils Absolute: 0 10*3/uL (ref 0.0–0.5)
Eosinophils Relative: 1 %
HCT: 23.2 % — ABNORMAL LOW (ref 36.0–46.0)
Hemoglobin: 7.9 g/dL — ABNORMAL LOW (ref 12.0–15.0)
Immature Granulocytes: 1 %
Lymphocytes Relative: 85 %
Lymphs Abs: 1.8 10*3/uL (ref 0.7–4.0)
MCH: 28.9 pg (ref 26.0–34.0)
MCHC: 34.1 g/dL (ref 30.0–36.0)
MCV: 85 fL (ref 80.0–100.0)
Monocytes Absolute: 0.1 10*3/uL (ref 0.1–1.0)
Monocytes Relative: 7 %
Neutro Abs: 0.1 10*3/uL — CL (ref 1.7–7.7)
Neutrophils Relative %: 6 %
Platelet Count: 24 10*3/uL — ABNORMAL LOW (ref 150–400)
RBC: 2.73 MIL/uL — ABNORMAL LOW (ref 3.87–5.11)
RDW: 16.2 % — ABNORMAL HIGH (ref 11.5–15.5)
WBC Count: 2.1 10*3/uL — ABNORMAL LOW (ref 4.0–10.5)
nRBC: 0 % (ref 0.0–0.2)

## 2019-08-04 LAB — CMP (CANCER CENTER ONLY)
ALT: 11 U/L (ref 0–44)
AST: 12 U/L — ABNORMAL LOW (ref 15–41)
Albumin: 4 g/dL (ref 3.5–5.0)
Alkaline Phosphatase: 149 U/L — ABNORMAL HIGH (ref 38–126)
Anion gap: 8 (ref 5–15)
BUN: 20 mg/dL (ref 6–20)
CO2: 22 mmol/L (ref 22–32)
Calcium: 9.3 mg/dL (ref 8.9–10.3)
Chloride: 107 mmol/L (ref 98–111)
Creatinine: 1.55 mg/dL — ABNORMAL HIGH (ref 0.44–1.00)
GFR, Est AFR Am: 42 mL/min — ABNORMAL LOW (ref >60)
GFR, Estimated: 36 mL/min — ABNORMAL LOW (ref >60)
Glucose, Bld: 125 mg/dL — ABNORMAL HIGH (ref 70–99)
Potassium: 4 mmol/L (ref 3.5–5.1)
Sodium: 137 mmol/L (ref 135–145)
Total Bilirubin: 0.5 mg/dL (ref 0.3–1.2)
Total Protein: 5.8 g/dL — ABNORMAL LOW (ref 6.5–8.1)

## 2019-08-04 LAB — SAMPLE TO BLOOD BANK

## 2019-08-04 LAB — PREPARE RBC (CROSSMATCH)

## 2019-08-04 LAB — HBV DNA QUANT: Hepatitis B virus DNA:PrThr:Pt:Bld:Ord:Probe.amp.tar: NOT DETECTED

## 2019-08-04 LAB — SBT HLA A #1

## 2019-08-04 LAB — HI RES SBT ABCDR

## 2019-08-04 MED ORDER — SODIUM CHLORIDE 0.9% FLUSH
10.0000 mL | INTRAVENOUS | Status: AC | PRN
  Administered 2019-08-04: 10 mL
  Filled 2019-08-04: qty 10

## 2019-08-04 MED ORDER — HEPARIN SOD (PORK) LOCK FLUSH 100 UNIT/ML IV SOLN
500.0000 [IU] | Freq: Once | INTRAVENOUS | Status: DC | PRN
  Filled 2019-08-04: qty 5

## 2019-08-04 MED ORDER — HEPARIN SOD (PORK) LOCK FLUSH 100 UNIT/ML IV SOLN
250.0000 [IU] | INTRAVENOUS | Status: AC | PRN
  Administered 2019-08-04: 18:00:00 250 [IU]
  Filled 2019-08-04: qty 5

## 2019-08-04 MED ORDER — SODIUM CHLORIDE 0.9% IV SOLUTION
250.0000 mL | Freq: Once | INTRAVENOUS | Status: AC
  Administered 2019-08-04: 250 mL via INTRAVENOUS
  Filled 2019-08-04: qty 250

## 2019-08-04 MED ORDER — SODIUM CHLORIDE 0.9% FLUSH
10.0000 mL | INTRAVENOUS | Status: DC | PRN
  Filled 2019-08-04: qty 10

## 2019-08-04 NOTE — Unmapped (Signed)
[facility] Clinical Assessment & Refill Coordination Note    Susan Kane, Susan Kane: 1961-06-27  Phone: [phone] (home)     All above HIPAA information was verified with patient.     Was a Nurse, learning disability used for this call? No    Specialty Medication(s):   Hematology/Oncology: tacrolimus 1mg      Current Outpatient Medications   Medication Sig Dispense Refill   ??? acetaminophen (TYLENOL) 325 MG tablet Take 650 mg by mouth every six (6) hours as needed for pain.     ??? ALPRAZolam (XANAX) 0.25 MG tablet 0.25 mg daily as needed.      ??? amoxicillin-clavulanate (AUGMENTIN) 875-125 mg per tablet Take 1 tablet by mouth Two (2) times a day for 7 days. 28 tablet 0   ??? cyclobenzaprine (FLEXERIL) 10 MG tablet TAKE 1 TABLET BY MOUTH 2 TIMES DAILY AS NEEDED FOR MUSCLE SPASMS. DO NOT TAKE WITH XANAX OR AMBIEN     ??? cycloSPORINE modified (NEORAL) 25 MG capsule cyclosporine modified 25 mg capsule     ??? deferasirox (EXJADE) 500 MG disintegrating tablet Take 1,000 mg by mouth.     ??? eltrombopag (PROMACTA) 50 MG tablet Take 150 mg by mouth.     ??? folic acid (FOLVITE) 1 MG tablet Take 1 mg by mouth daily.      ??? levoFLOXacin (LEVAQUIN) 500 MG tablet Take 1 tablet (500 mg total) by mouth daily. 30 tablet 2   ??? levonorgestrel (MIRENA) 20 mcg/24 hours (5 yrs) 52 mg IUD 1 each by Intrauterine route.     ??? levothyroxine (SYNTHROID) 100 MCG tablet Take 100 mcg by mouth daily.      ??? lithium (LITHOBID) 300 MG ER tablet Take 600 mg by mouth at bedtime.     ??? LORazepam (ATIVAN) 0.5 MG tablet lorazepam 0.5 mg tablet   TAKE 1 TABLET BY MOUTH TWICE A DAY PRIOR TO CYCLOSPORINE DOSE     ??? ondansetron (ZOFRAN-ODT) 8 MG disintegrating tablet Take 8 mg by mouth every eight (8) hours as needed for nausea.     ??? posaconazole (NOXAFIL) 100 mg TbEC delayed released tablet Take 300 mg by mouth daily. 90 tablet 2   ??? prochlorperazine (COMPAZINE) 5 MG tablet Take 5 mg by mouth every six (6) hours as needed. ??? SUMAtriptan (IMITREX) 20 mg/actuation nasal spray USE 1 SPRAY IN THE NOSE DAILY AS NEEDED FOR MIGRAINE     ??? tacrolimus (PROGRAF) 1 MG capsule Take 1 capsule (1 mg total) by mouth two (2) times a day. 60 capsule 2   ??? TRINTELLIX 20 mg tablet Take 20 mg by mouth daily.      ??? valACYclovir (VALTREX) 500 MG tablet Take 500 mg by mouth daily.      ??? zolpidem (AMBIEN) 10 mg tablet Take 10 mg by mouth nightly.        No current facility-administered medications for this visit.         Changes to medications: Susan Kane reports no changes at this time.    No Known Allergies    Changes to allergies: No    SPECIALTY MEDICATION ADHERENCE     Tacrolimus 1 mg: 7 days of medicine on hand     Medication Adherence    Patient reported X missed doses in the last month: 0  Specialty Medication: Praluent   Patient is on additional specialty medications: No          Specialty medication(s) dose(s) confirmed: Regimen is correct and  unchanged.     Are there any concerns with adherence? No    Adherence counseling provided? Not needed    CLINICAL MANAGEMENT AND INTERVENTION      Clinical Benefit Assessment:    Do you feel the medicine is effective or helping your condition? Yes    Clinical Benefit counseling provided? Not needed    Adverse Effects Assessment:    Are you experiencing any side effects? No    Are you experiencing difficulty administering your medicine? No    Quality of Life Assessment:    How many days over the past month did your condition  keep you from your normal activities? For example, brushing your teeth or getting up in the morning. 0    Have you discussed this with your provider? Not needed    Therapy Appropriateness:    Is therapy appropriate? Yes, therapy is appropriate and should be continued    DISEASE/MEDICATION-SPECIFIC INFORMATION      N/A    PATIENT SPECIFIC NEEDS     ? Does the patient have any physical, cognitive, or cultural barriers? No    ? Is the patient high risk? No ? Does the patient require a Care Management Plan? No     ? Does the patient require physician intervention or other additional services (i.e. nutrition, smoking cessation, social work)? No      SHIPPING     Specialty Medication(s) to be Shipped:   Hematology/Oncology: tacrolimus    Other medication(s) to be shipped: n/a     Changes to insurance: No    Delivery Scheduled: Yes, Expected medication delivery date: 08/07/19.     Medication will be delivered via UPS to the confirmed prescription address in [facility].    The patient will receive a drug information handout for each medication shipped and additional FDA Medication Guides as required.  Verified that patient has previously received a Conservation officer, historic buildings.    All of the patient's questions and concerns have been addressed.    [name]  [name]   [facility] Specialty Pharmacist

## 2019-08-04 NOTE — Progress Notes (Signed)
RN requested patient be seen today in the infusion room.  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  Past medical history includes thyroid disease, mitral valve prolapse, hypothyroidism, depression, and anxiety.  Medications include Xanax, Folvite, Synthroid, Ativan, Zofran, Compazine.  Medications include glucose 125, creatinine 1.55, hemoglobin 7.9 and hematocrit 23.2.  Height: 5 feet 6 inches. Weight: 123.4 pounds January 11. Usual body weight: Approximately 140 pounds. BMI: 19.92  Patient reports some malaise. She states overall her nausea has improved but she continues to have intermittent emesis. Reports occasional diarrhea. She has started to try some oral nutrition supplements but still has not found one she likes.  Nutrition diagnosis: Unintended weight loss related to inadequate oral intake as evidenced by 12% weight loss from usual body weight.  Intervention: Educated patient to consume small frequent snacks every 2 hours consisting of foods high in calories and protein. Recommended she continue to try oral nutrition supplements and provided many different samples and coupons. Reviewed strategies for improving nausea including taking her nausea medication as prescribed by MD. Fact sheets were given.  Questions were answered.  Teach back method used.  Contact information has been provided.  Monitoring, evaluation, goals: Patient will tolerate increased calories and protein to minimize further weight loss.  Next visit: Patient to contact me for questions or concerns.  **Disclaimer: This note was dictated with voice recognition software. Similar sounding words can inadvertently be transcribed and this note may contain transcription errors which may not have been corrected upon publication of note.**

## 2019-08-04 NOTE — Patient Instructions (Signed)

## 2019-08-04 NOTE — Progress Notes (Signed)
[facility] OFFICE PROGRESS NOTE   Diagnosis: CLL, aplastic anemia  INTERVAL HISTORY:   Ms. Head returns for a scheduled visit.  She continues tacrolimus and Promacta.  She was seen at [facility] last week.  She was prescribed Augmentin for external otitis, however did not take this.  She underwent a negative ENT evaluation and normal audiology exam on 07/30/2018.  No fever.  No bleeding other than vaginal bleeding.  She reports malaise.  No dyspnea or palpitations.  Nausea has improved since discontinuing cyclosporine, but she had multiple episodes of emesis over the weekend.  The emesis is not clearly associated with eating.  She has occasional episodes of diarrhea.  Her appetite has diminished.  Gum hypertrophy has improved.  The right shoulder stiffness and pain persists following the steroid Dosepak.  Objective:  Vital signs in last 24 hours:  Blood pressure 96/62, pulse 92, temperature 98.3 F (36.8 C), temperature source Temporal, resp. rate 15, height _0  (1.676 m), weight 123 lb 6.4 oz (56 kg), SpO2 100 %.    HEENT: No thrush.  No active bleeding.  Gum hypertrophy is less prominent Resp: Lungs clear bilaterally Cardio: Regular rate and rhythm GI: Nontender, no hepatosplenomegaly Vascular: No leg edema Musculoskeletal: Limited range of motion and pain with abduction of the right shoulder  Portacath/PICC-without erythema  Lab Results:  Lab Results  Component Value Date   WBC 2.1 (L) 08/04/2019   HGB 7.9 (L) 08/04/2019   HCT 23.2 (L) 08/04/2019   MCV 85.0 08/04/2019   PLT 24 (L) 08/04/2019   NEUTROABS PENDING 08/04/2019    CMP  Lab Results  Component Value Date   NA 136 07/28/2019   K 4.7 07/28/2019   CL 104 07/28/2019   CO2 25 07/28/2019   GLUCOSE 129 (H) 07/28/2019   BUN 21 (H) 07/28/2019   CREATININE 1.41 (H) 07/28/2019   CALCIUM 9.6 07/28/2019   PROT 6.1 (L) 07/28/2019   ALBUMIN 4.0 07/28/2019   AST 14 (L) 07/28/2019   ALT 19 07/28/2019   ALKPHOS 104 07/28/2019   BILITOT 0.2 (L) 07/28/2019   GFRNONAA 41 (L) 07/28/2019   GFRAA 47 (L) 07/28/2019     Medications: I have reviewed the patient's current medications.   Assessment/Plan: . 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 Mild splenomegaly.  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, no evidence of 11q22deletion  Bone marrow biopsy 02/26/2018-hypercellular marrow with extensive involvement by CLL, lymphocytes represent85% of all cells  Ibrutinib initiated 04/03/2018  Ibrutinib placed on hold 04/11/2018 due to onset of arthralgias  Ibrutinib resumed 04/16/2018, discontinued 04/25/2018 secondary to severe arthralgias/arthritis  Ibrutinib resumed at a dose of 140 mg daily 05/03/2018  Ibrutinib dose adjusted to 140 mg alternating with 243m 06/25/2018  Ibrutinib discontinued 07/03/2018 secondary to severe arthralgias  Acalabrutinib 08/16/2018, discontinued 11/15/2018 secondary to persistent severe transfusion dependent anemia and neutropenia/thrombocytopenia, last dose 11/14/2018  Bone marrow biopsy 11/21/2018-decreased cellularity, involvement by  CLL, decreased erythroid and granulocytic precursors, decreased megakaryocytes  Cycle 1 rituximab 12/06/2018  Bone marrow biopsy 12/24/2018 at [facility]-hypocellular bone marrow (10%) involved by CLL, representing 50% of marrow cellularity; markedly decreased trilineage hematopoiesis including essentially absent erythropoiesis  Bone marrow biopsy 03/06/2027 [facility]-hypocellular bone marrow (20%) with marked reduction of maturing hematopoietic elements; numerous lymphoid aggregates consistent with involvement by CLL, representing approximately 50% of marrow cellularity by flow cytometry  ATG/cyclosporine at [facility] beginning 03/19/2019 followed by prednisone taper  Eltrombopag beginning 07/05/2019  Cyclosporine discontinued 07/08/2019  Tacrolimus 07/10/2019  2.Hypothyroidism 3.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 Augmentin prescribed. 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 Pancytopenia secondary to CLL and a hypocellular bone marrow  G-CSF and Nplate started 10/12/252, G-CSF changed to daily beginning 12/17/2018; G-CSF discontinued 12/31/2018, last Nplate 01/15/2019  Began prednisone 60 mg daily 01/11/19, tolerating moderately well except jitteriness, irritability, and difficulty sleeping. Tapered to 40 mg daily x1 week starting 01/24/19, reduce by 10 mg each week until discontinued. Prednisone discontinued 02/20/2019.  Promacta started 01/20/2019, dose increased to 100 mg daily 02/04/2019; dose increased to 150 mg daily 02/21/2019  IVIG daily for 2 days beginning 02/25/2019   9. Severe headache and  nausea/vomiting 02/27/2019-likely related to IVIG therapy,resolved 10.Intermittent nausea and vomiting following cyclosporine dosing 11.Gingival hypertrophy secondary to cyclosporine-improved 12.MRI liver 06/12/2019-iron overload in liver and spleen. No lymphadenopathy or splenomegaly.  Exjade beginning 06/28/2019. 13.  Elevated creatinine-cyclosporine toxicity?  Persistent 14.  Vaginal bleeding beginning 07/13/2019-referred to gynecology     Disposition: Ms. Duchesneau has persistent pancytopenia.  She has not received platelets in over 3 weeks, but continues to require intermittent red cell transfusion and has severe neutropenia.  She is undergoing a transplant evaluation at [facility].  The plan is to continue tacrolimus and eltrombopag.  We will follow up on the tacrolimus level from today.  The creatinine is mildly increased, potentially related to toxicity from one of the medications.  The etiology of the intermittent nausea is unclear.  She is losing weight.  We will asked the Cancer center nutritionist to contact her.  We will consider GI evaluation if she develops consistent diarrhea.  Dr. Prince Solian recommends continuing the current treatment regimen and proceeding with the bone marrow transplant evaluation.  She will return for lab visit on 08/08/2019 and an office visit on 08/13/2019.  Betsy Coder, MD  08/04/2019  1:21 PM

## 2019-08-04 NOTE — Progress Notes (Signed)
ANC 0.1 - relayed result verbally to Dr. Benay Spice at 1:58pm.

## 2019-08-04 NOTE — Progress Notes (Signed)
Confirmed w/patient that she held her am dose of tacrolimus today. Will transfuse 1 unit blood today. Confirmed with blood bank.

## 2019-08-05 DIAGNOSIS — M7541 Impingement syndrome of right shoulder: Secondary | ICD-10-CM | POA: Diagnosis not present

## 2019-08-05 DIAGNOSIS — M7542 Impingement syndrome of left shoulder: Secondary | ICD-10-CM | POA: Diagnosis not present

## 2019-08-05 LAB — TYPE AND SCREEN
ABO/RH(D): O POS
Antibody Screen: NEGATIVE
Unit division: 0

## 2019-08-05 LAB — BPAM RBC
Blood Product Expiration Date: 202102082359
ISSUE DATE / TIME: 202101111540
Unit Type and Rh: 5100

## 2019-08-05 NOTE — Unmapped (Signed)
Bone Marrow Transplant/Cellular Therapy Outpatient Clinical Social Work Progress Note:   TC w patient [phone]) to follow up on caregiver plan; patient reported that she and her husband had not discussed caregiver plan, and she is thinking that they will go with an agency during the day; SW reviewed need for primary and backup caregiver, and that SW will need to contact all members of the caregiver team; patient expressed intention to talk with husband tonight and requested that SW follow up with her on Wednesday of this week.     Nadara Eaton, LCSW   Outpatient Social Worker   Bone Marrow Transplant Clinic   Phone: [phone]  Pager: [phone]    August 04, 2019 4:42 PM

## 2019-08-06 ENCOUNTER — Other Ambulatory Visit: Payer: Self-pay | Admitting: Oncology

## 2019-08-06 DIAGNOSIS — C9110 Chronic lymphocytic leukemia of B-cell type not having achieved remission: Secondary | ICD-10-CM

## 2019-08-06 LAB — TACROLIMUS LEVEL: Tacrolimus (FK506) - LabCorp: 5.8 ng/mL (ref 2.0–20.0)

## 2019-08-06 MED FILL — TACROLIMUS 1 MG CAPSULE: 30 days supply | Qty: 60 | Fill #1 | Status: AC

## 2019-08-06 MED FILL — TACROLIMUS 1 MG CAPSULE, IMMEDIATE-RELEASE: ORAL | 30 days supply | Qty: 60 | Fill #1

## 2019-08-06 NOTE — Unmapped (Signed)
Bone Marrow Transplant/Cellular Therapy Outpatient Clinical Social Work Progress Note:   Follow up TC with patient [phone]) regarding her caregiver plan; patient reports that she talked with husband and they would like to move forward with an agency as primary caregiver during the day, however they are having difficulty locating an agency in the [place] area; SW and patient discussed other options such as her adult children and friends staying with her as primary caregiver 1-2 weeks at a time with her husband (who can work at [employer]) as backup caregiver; patient reports that she will start thinking of people to ask and expressed that her children (1 in [place], 2 in [place]) and friends have offered to help; SW provided information about out of state caregivers getting a COVID-19 test -- patient reports understanding; SW normalized and validated hesitancy to ask for help, and encouraged patient to think about accepting help as that would help her develop options for a caregiver plan; patient expressed in intention to make list of possible caregivers and talk with husband tonight; SW to follow up with patient Friday.     SW sent inbasket message to Charyl Dancer, [facility], providing update on state of patient's caregiver plan.     Nadara Eaton, LCSW   Outpatient Social Worker   Bone Marrow Transplant Clinic   Phone: [phone]  Pager: [phone]    August 06, 2019 4:58 PM

## 2019-08-07 ENCOUNTER — Encounter: Payer: Self-pay | Admitting: *Deleted

## 2019-08-07 DIAGNOSIS — Z30432 Encounter for removal of intrauterine contraceptive device: Secondary | ICD-10-CM | POA: Diagnosis not present

## 2019-08-07 DIAGNOSIS — N950 Postmenopausal bleeding: Secondary | ICD-10-CM | POA: Diagnosis not present

## 2019-08-07 NOTE — Progress Notes (Signed)
Tacrolimus trough level result faxed to [facility] at [phone] with Mondays CBC and CMP. Delynn FlavinJoellen Jersey, PharmD

## 2019-08-08 ENCOUNTER — Other Ambulatory Visit: Payer: Self-pay

## 2019-08-08 ENCOUNTER — Inpatient Hospital Stay: Payer: BC Managed Care – PPO

## 2019-08-08 DIAGNOSIS — R251 Tremor, unspecified: Secondary | ICD-10-CM | POA: Diagnosis not present

## 2019-08-08 DIAGNOSIS — R5381 Other malaise: Secondary | ICD-10-CM | POA: Diagnosis not present

## 2019-08-08 DIAGNOSIS — M25611 Stiffness of right shoulder, not elsewhere classified: Secondary | ICD-10-CM | POA: Diagnosis not present

## 2019-08-08 DIAGNOSIS — C9110 Chronic lymphocytic leukemia of B-cell type not having achieved remission: Secondary | ICD-10-CM | POA: Diagnosis not present

## 2019-08-08 DIAGNOSIS — D61818 Other pancytopenia: Secondary | ICD-10-CM | POA: Diagnosis not present

## 2019-08-08 DIAGNOSIS — R410 Disorientation, unspecified: Secondary | ICD-10-CM | POA: Diagnosis not present

## 2019-08-08 DIAGNOSIS — R197 Diarrhea, unspecified: Secondary | ICD-10-CM | POA: Diagnosis not present

## 2019-08-08 DIAGNOSIS — D696 Thrombocytopenia, unspecified: Secondary | ICD-10-CM

## 2019-08-08 DIAGNOSIS — R112 Nausea with vomiting, unspecified: Secondary | ICD-10-CM | POA: Diagnosis not present

## 2019-08-08 DIAGNOSIS — M1990 Unspecified osteoarthritis, unspecified site: Secondary | ICD-10-CM | POA: Diagnosis not present

## 2019-08-08 DIAGNOSIS — E039 Hypothyroidism, unspecified: Secondary | ICD-10-CM | POA: Diagnosis not present

## 2019-08-08 DIAGNOSIS — Z79899 Other long term (current) drug therapy: Secondary | ICD-10-CM | POA: Diagnosis not present

## 2019-08-08 DIAGNOSIS — M25511 Pain in right shoulder: Secondary | ICD-10-CM | POA: Diagnosis not present

## 2019-08-08 DIAGNOSIS — Z95828 Presence of other vascular implants and grafts: Secondary | ICD-10-CM

## 2019-08-08 LAB — CBC WITH DIFFERENTIAL (CANCER CENTER ONLY)
Abs Immature Granulocytes: 0.03 10*3/uL (ref 0.00–0.07)
Basophils Absolute: 0 10*3/uL (ref 0.0–0.1)
Basophils Relative: 0 %
Eosinophils Absolute: 0.1 10*3/uL (ref 0.0–0.5)
Eosinophils Relative: 2 %
HCT: 27 % — ABNORMAL LOW (ref 36.0–46.0)
Hemoglobin: 9.1 g/dL — ABNORMAL LOW (ref 12.0–15.0)
Immature Granulocytes: 1 %
Lymphocytes Relative: 81 %
Lymphs Abs: 2 10*3/uL (ref 0.7–4.0)
MCH: 28.5 pg (ref 26.0–34.0)
MCHC: 33.7 g/dL (ref 30.0–36.0)
MCV: 84.6 fL (ref 80.0–100.0)
Monocytes Absolute: 0.3 10*3/uL (ref 0.1–1.0)
Monocytes Relative: 10 %
Neutro Abs: 0.2 10*3/uL — CL (ref 1.7–7.7)
Neutrophils Relative %: 6 %
Platelet Count: 26 10*3/uL — ABNORMAL LOW (ref 150–400)
RBC: 3.19 MIL/uL — ABNORMAL LOW (ref 3.87–5.11)
RDW: 15.7 % — ABNORMAL HIGH (ref 11.5–15.5)
WBC Count: 2.6 10*3/uL — ABNORMAL LOW (ref 4.0–10.5)
nRBC: 0 % (ref 0.0–0.2)

## 2019-08-08 LAB — BASIC METABOLIC PANEL - CANCER CENTER ONLY
Anion gap: 6 (ref 5–15)
BUN: 19 mg/dL (ref 6–20)
CO2: 24 mmol/L (ref 22–32)
Calcium: 9.6 mg/dL (ref 8.9–10.3)
Chloride: 106 mmol/L (ref 98–111)
Creatinine: 1.19 mg/dL — ABNORMAL HIGH (ref 0.44–1.00)
GFR, Est AFR Am: 58 mL/min — ABNORMAL LOW (ref >60)
GFR, Estimated: 50 mL/min — ABNORMAL LOW (ref >60)
Glucose, Bld: 114 mg/dL — ABNORMAL HIGH (ref 70–99)
Potassium: 4.2 mmol/L (ref 3.5–5.1)
Sodium: 136 mmol/L (ref 135–145)

## 2019-08-08 LAB — LITHIUM LEVEL: Lithium Lvl: 2.1 mmol/L (ref 0.60–1.20)

## 2019-08-08 MED ORDER — SODIUM CHLORIDE 0.9% FLUSH
10.0000 mL | INTRAVENOUS | Status: DC | PRN
  Filled 2019-08-08: qty 10

## 2019-08-08 MED ORDER — HEPARIN SOD (PORK) LOCK FLUSH 100 UNIT/ML IV SOLN
500.0000 [IU] | Freq: Once | INTRAVENOUS | Status: DC | PRN
  Filled 2019-08-08: qty 5

## 2019-08-08 NOTE — Unmapped (Deleted)
Patient Name: Susan Kane  Patient Age: 59 y.o.  Encounter Date: 07/08/2019  ??  Primary Care Provider:  Thora Lance, MD  ??  Referring Physician:  Lillia Mountain, MD  [address]  [place],  [place] [address]    Diagnosis: CLL now with aplastic anemia (possibly due to BTK inhibitor acalabrutinib)    Transplant under consideration: {Blank single:19197::MAC allogeneic,RIC allogeneic,autologous,Haploidentical allogenic,Cord Blood Allogeneic}    History of Present Illness:   ***    Oncology History Overview Note   CLL:    Summary as per Dr. Kalman Drape most recent note with minor edits/additions from my review of records    1.??CLL?????diagnosed in August 2010, flow cytometry consistent with CLL  ?? Enlarged left??inguinal lymph node January 2019,??small??neck/axillary nodes and palpable splenomegaly 09/11/2017  ?? CTs??on 09/17/2017-3 cm necrotic appearing lymph node in the left inguinal region, borderline enlarged pelvic/retroperitoneal, chest, and axillary nodes. ??Mild splenomegaly.  ?? Ultrasound-guided biopsy of the left inguinal lymph node 09/18/2017, slightly purulent fluid aspirated, core biopsy is consistent with an atypical lymphoid proliferation???extensive necrosis with surrounding epithelioid histiocytes, limited intact lymphoid tissue involved with CLL  ?? Incisional biopsy of a necrotic/purulent left inguinal lymph node on 10/01/2017???extensive necrosis with granulomatous inflammation, small amount of viable lymphoid tissue involved with CLL, AFB and fungal stains negative  ?? Peripheral blood FISH analysis 02/05/2018??? +deletion 13q14, no evidence of p53 (17p13) deletion, no evidence of 11q22??deletion  - normal karyotype (46,XX)  ?? Bone marrow biopsy 02/26/2018???hypercellular marrow with extensive involvement by CLL, lymphocytes represent??85% of all cells  ?? Ibrutinib initiated 04/03/2018  ?? Ibrutinib placed on hold 04/11/2018 due to onset of arthralgias  ?? Ibrutinib resumed 04/16/2018, discontinued 04/25/2018 secondary to severe arthralgias/arthritis  ?? Ibrutinib resumed at a dose of 140 mg daily 05/03/2018  ?? Ibrutinib dose adjusted to 140 mg alternating with 280 mg 06/25/2018  ?? 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 ??Augmentin prescribed.  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    Baseline BM bx reviewed at [facility]  Final Diagnosis   (Outside Case #:  [id number], dated 02/26/2018)  Bone marrow, aspiration and biopsy  -  Hypercellular bone marrow (80%) with extensive involvement by chronic lymphocytic leukemia (87% lymphocytes by manual aspirate differential)  (See Comment)  -  By outside report, cytogenetic results are normal     Karyotype: 12, XX and FISH with 13q del but no 11q or 17p del    Repeat BM bx: (done after being off ibrutinib x1 month)  80% involvement by CLL    08/13/18: Presents to [facility] to discuss plan of care     Chronic lymphocytic leukemia (CLL), B-cell (CMS-HCC)   07/12/2018 Initial Diagnosis    Chronic lymphocytic leukemia (CLL), B-cell (CMS-HCC)     08/26/2018 -  Chemotherapy    Acalabrutinib started (approximate date)     11/14/2018 -  Chemotherapy    Acalabrutinib discontinued due to progressive bone marrow failure     12/06/2018 -  Chemotherapy    Ritux x1 given due to CLL still making up majority of BM cellularity (though in the setting of near aplasia of rest of TLH)     03/19/2019 -  Chemotherapy  Equine ATG / CsA for aplastic anemia (suspect 2/t acalabrutinib)         Past Medical History:   Diagnosis Date   ??? Anxiety    ??? Depression    ??? Hypothyroid      Past Surgical History: Procedure Laterality Date   ??? CESAREAN SECTION         Current Outpatient Medications   Medication Sig Dispense Refill   ??? acetaminophen (TYLENOL) 325 MG tablet Take 650 mg by mouth every six (6) hours as needed for pain.     ??? ALPRAZolam (XANAX) 0.25 MG tablet 0.25 mg daily as needed.      ??? cyclobenzaprine (FLEXERIL) 10 MG tablet TAKE 1 TABLET BY MOUTH 2 TIMES DAILY AS NEEDED FOR MUSCLE SPASMS. DO NOT TAKE WITH XANAX OR AMBIEN     ??? cycloSPORINE modified (NEORAL) 25 MG capsule cyclosporine modified 25 mg capsule     ??? deferasirox (EXJADE) 500 MG disintegrating tablet Take 1,000 mg by mouth.     ??? eltrombopag (PROMACTA) 50 MG tablet Take 150 mg by mouth.     ??? folic acid (FOLVITE) 1 MG tablet Take 1 mg by mouth daily.      ??? levoFLOXacin (LEVAQUIN) 500 MG tablet Take 1 tablet (500 mg total) by mouth daily. 30 tablet 2   ??? levonorgestrel (MIRENA) 20 mcg/24 hours (5 yrs) 52 mg IUD 1 each by Intrauterine route.     ??? levothyroxine (SYNTHROID) 100 MCG tablet Take 100 mcg by mouth daily.      ??? lithium (LITHOBID) 300 MG ER tablet Take 600 mg by mouth at bedtime.     ??? LORazepam (ATIVAN) 0.5 MG tablet lorazepam 0.5 mg tablet   TAKE 1 TABLET BY MOUTH TWICE A DAY PRIOR TO CYCLOSPORINE DOSE     ??? ondansetron (ZOFRAN-ODT) 8 MG disintegrating tablet Take 8 mg by mouth every eight (8) hours as needed for nausea.     ??? posaconazole (NOXAFIL) 100 mg TbEC delayed released tablet Take 300 mg by mouth daily. 90 tablet 2   ??? prochlorperazine (COMPAZINE) 5 MG tablet Take 5 mg by mouth every six (6) hours as needed.      ??? SUMAtriptan (IMITREX) 20 mg/actuation nasal spray USE 1 SPRAY IN THE NOSE DAILY AS NEEDED FOR MIGRAINE     ??? tacrolimus (PROGRAF) 1 MG capsule Take 1 capsule (1 mg total) by mouth two (2) times a day. 60 capsule 2   ??? TRINTELLIX 20 mg tablet Take 20 mg by mouth daily.      ??? valACYclovir (VALTREX) 500 MG tablet Take 500 mg by mouth daily.      ??? zolpidem (AMBIEN) 10 mg tablet Take 10 mg by mouth nightly.        No current facility-administered medications for this visit.      Assessment and Plan:  Diagnosis: {Blank single:19197::AML,ALL,MDS,Aplastic Anemia,Myelofibrosis,NHL,HD}  @NAME @  @AGE @ @DOB @   BMT MD/APP:  ***/***        Disease status: {MLR disease status:35908}     Transplant plan and donor information:  Tx type: {ads type of transplant:59159}*** Match: ***{ADS Type of match:59174};Blood Type: {ads blood type:59163};CMV Status: {ads postive negative:59164}   Conditioning: *** Recipient: Blood Type: {ads blood type:59163};CMV Status: {ads postive negative:59164}        Patient Active Problem List    Diagnosis Date Noted   ??? Drug-induced nausea and vomiting 06/03/2019   ??? Lower abdominal pain 04/03/2019   ??? Depression 03/19/2019   ???  Aplastic anemia (CMS-HCC) 03/11/2019   ??? Chronic lymphocytic leukemia of B-cell type not having achieved remission (CMS-HCC) 01/21/2019   ??? Hypogammaglobulinemia (CMS-HCC) 08/13/2018   ??? Chronic lymphocytic leukemia (CLL), B-cell (CMS-HCC) 07/12/2018       Comorbidity Index Score:   aaHCT-CI Score=***    (1) Arrhythmia  af/afib   (1) Cardiac  cad/chf/mi/ef<50   (1) CVA  tia/cva   (1) Diabetes  treatment   (3) Heart Valve     (1) Hepa Mild  bili<1.5/ast<2.5   (3)Hepa Severe  bili>1.5/ast>2.5   (1) Infection  abx s/p day 0   (1) Inflam Bowel Disease     (1) Obesity  BMI > 35   (2) Peptic ulcer  treatment   (1) Psych  treatment   (2) Pulm mod  dlco 66-80   (3) Pulm severe  dlco <65   (2) Renal  CR>2   (2) Rheum  SLE/RA   (3) Solid tumor  treat xskin   Other Co-morbid     Age 59 and  Greater      Timed Up and Go Test: *** seconds     6 Minute Walk Test   Distance walked: *** feet/*** meters  Oxygen saturation pre-walk: ***    Oxygen saturation post-walk: ***  Cardiovascular   ECHO: EF ***%  EKG:***:  Cardiology consult: Yes/No***  Pulmonary   PFTs: FVC-***%; FEV1-***%, DLCOc ([place])-***%  CXR:  Caregiver plan/Concerns   Primary:***   Back up:***  Social concerns:***  Other concerns:***     Comorbidity Index Score:  ***  {Blank multiple:19196::Arrhythmia,Cardiac,Inflammatory bowel disease,Diabetes,Cerebrovascular disease,Psychiatric disease,Hepatic, mild,Obesity,Infection,Rheumatologic,Peptic ulcer,Moderate/severe Renal,Moderate Pulmonary,Prior solid tumor,Heart valve disease,Severe Pulmonary,Moderate/Severe Hepatic}    Physical exam:  There were no vitals taken for this visit.  General Appearance:   No acute distress  HEENT:                        Dentition {Blank multiple:19196::No visible caries, gum disease, or infection,Multiple caries and poor dentition,Upper dentures in place, no visible gum or tooth disease,Lower dentures in place, no visible gum or tooth disease,Both upper and lower dentures in place, no ulcerations or gum disease noted,***}  Lungs:                Clear to auscultation bilaterally  Heart:                           Regular rate and rhythm  Abdomen:                Soft, non-tender, non-distended  Extremities:              Warm and well perfused  Neurological:               {NEURO:21489}  Musculoskeletal:         Positive for {SYS EXTREMITIES/MSK ROS:13639}    {IP ID [id number]}    Overall Assessment  Functional Status:  - Current physical activity:     Assistive Devices:  {Blank multiple:19196::None,Walker,Rollator,Cane,Wheelchair,***}    Recommendations:    Cardiopulmonary Assessment:    Echo:  EKG:  Referral needed:    PFTs: FVC: ***%, FEV1: ***%; DLCO corrected w/[place]: ***%.    6 Minute Walk Test:  Distance walked: *** feet/*** meters  Oxygen saturation pre-walk: ***  Oxygen saturation post-walk: ***    Timed Up and Go Test:  Time: *** seconds    Recommendations:  Nutrition Assessment:   - BMI:  - Current Diet:     Recommendations:     Other Comorbidities:  ***     Psychosocial Assessment:   Psychiatric diagnosis:  - Current medications:  - Caregiving Plan:  - CCSP referral needed?: Disposition  - Residence after transplant:  - Transportation Plan:  - PCP:    Medication Adherence:     TERS Scoring    Characteristic Weight Assessment value Total points   Prior Axis I disorder   4.0 {TERSAXIS1:47078} {Total Points:47087}   Prior Axis II disorder    4.0 {TERSAXIS2:47079} {Total Points:47087}   Substance Use/Abuse 3.0 {Substance Use/Abuse:47088} {Total points 2:47089}   Compliance 3.0 {TERSCompliance:47090} {Total points 2:47089}   Health Behaviors 2.5 {TERS Heath Behaviors:47091} {Total Points 3:47092}   Quality of Family/Social Support 2.5 {Quality of Family/Social Support:47093} {Total Points 3:47092}   Prior history of Coping 2.5 {TERS history of coping:47094} {Total Points 3:47092}   Coping with Disease and Treatment 2.5 {TERS Coping with disease:47095} {Total Points 3:47092}   Quality of Affect 1.5 {TERS Quality of Affect:47096} {Total points 4:47097}   Mental Status (past and present) 1.0 {TERS Mental Status:47098} {Total points 5:47099}               TOTAL SCORE:  ***    Recommendations:     Study Participation/Consideration:   {Blank multiple:19196::CTN 1203,CTN 1201,FAST Exercise Study,***}    Patient Functional Assessment will be discussed at the BMT Patient Care Conference.  A written copy of the above recommendations will be sent to the patient for their records and all recommendations were discussed during their visit today.  For any questions they should contact their Nurse Coordinator prior to their next scheduled visit.      Ludwig Tugwell A. Marisa Hua, FNP-BC  Nurse Practitioner - Adult BMT

## 2019-08-08 NOTE — Unmapped (Signed)
On 12/15, Susan Kane was switched from cyclosporine to tacrolimus. This was mainly due to the limitations of nausea/vomiting on her levels and quality of life. If she were to get a transplant she would need to tolerate immunosuppression therapy, and so therefore the decision was made to switch her to tacrolimus.     She was started on 1 mg BID. This dose was determined based off a target dose of 0.03 mg/kg BID with a 50% empiric dose reduction for the posaconazole interaction. Patient's tacrolimus level from 1/11 came back 5.8 ng/mL (goal 5-15). This is therapeutic, confirmed this was a trough with local RN. Her SCr continues to be elevated to 1.55. Her LFTs are WNL. CBC continues to shoe low counts (ANC 0.1, PLT 24, Hgb 7.9). Of note, she is currently on Exjade 1000 mg daily and Promacta 150 mg daily.      She should remain on ppx with posaconazole indefinitely to avoid stopping/starting which impacts tacrolimus metabolism and levels. She should continue other ppx of levofloxacin while ANC < 0.5, and valtrex and Bactrim indefinitely. Should continue to get weekly tacrolimus troughs locally. I will continue to follow.       Manfred Arch, PharmD, BCOP, CPP  Pager: [phone]

## 2019-08-08 NOTE — Unmapped (Deleted)
Provider in clinic for phone visit. Pt not in clinic.

## 2019-08-08 NOTE — Unmapped (Signed)
Bone Marrow Transplant/Cellular Therapy Outpatient Clinical Social Work Progress Note:   SW attempted to contact patient by telephone to follow up on caregiver plan -- SW unable to leave message as patients mailbox is full. SW will follow up with patient next week.    SW sent inbakset message to Charyl Dancer, [facility], notifying her that SW was not able to reach patient today and will try patient next week.     Nadara Eaton, LCSW   Outpatient Social Worker   [facility] Transplant Clinic   Phone: [phone]  Pager: [phone]    August 08, 2019 4:08 PM

## 2019-08-08 NOTE — Unmapped (Signed)
Bone Marrow Transplant/Cellular Therapy Outpatient Clinical Social Work Progress Note:   SW sent email to patient's husband ([email or web address]) as patient indicated that he is the one contacting agencies to assess for availability to provide primary caregiver support post-transplant. SW provided contact information for [employer] [phone]) located in [place], [place]; SW encouraged a call to agency to get more info about availability and possible restrictions due to COVID-19; SW educated that this agency may not provide primary caregiver support, rather can assist with backup caregiver support; SW gave brief update around conversation with patient earlier this week about brainstorming other options for caregivers (including family and friends). SW to follow up with patient tomorrow.     Nadara Eaton, LCSW   Outpatient Social Worker   [facility]   Phone: [phone]  Pager: [phone]    August 08, 2019 3:42 PM

## 2019-08-09 LAB — SAMPLE TO BLOOD BANK

## 2019-08-10 ENCOUNTER — Other Ambulatory Visit: Payer: Self-pay | Admitting: Nurse Practitioner

## 2019-08-10 DIAGNOSIS — C9110 Chronic lymphocytic leukemia of B-cell type not having achieved remission: Secondary | ICD-10-CM

## 2019-08-11 ENCOUNTER — Telehealth: Payer: Self-pay | Admitting: Psychiatry

## 2019-08-11 ENCOUNTER — Telehealth: Payer: Self-pay | Admitting: *Deleted

## 2019-08-11 DIAGNOSIS — F331 Major depressive disorder, recurrent, moderate: Secondary | ICD-10-CM

## 2019-08-11 NOTE — Unmapped (Signed)
I was notified that Esty missed her telemedicine APP visit on Friday and had a question regarding her appointment with Dr. Oswald Hillock scheduled for tomorrow.    I called and spoke with her. She stated that she didn't know she had a visit scheduled this past Friday and was unclear regarding tomorrows visit with Dr. Oswald Hillock.  I asked if she received my message via mychart as well as the ability to see her appointment schedules. She told me that she had been having trouble getting into her mychart and was unable to see her messages/appointments.  I reviewed the FAV outline including APP and Attending appointments. She stated she recalled this information. I will cancel her appointment with Dr. Oswald Hillock for tomorrow.  I explained that we needed to have the APP visit first. I further explained that clinic is closed today and that I would reach out once back in my office to reschedule both appointments.  She verbalized understanding and thanked me for my phone call.

## 2019-08-11 NOTE — Telephone Encounter (Addendum)
Left VM at office of Dr. Cristy Friedlander (psychiatrist) requesting MD call Dr. Benay Spice this morning to discuss management of her critically high lithium level noted over the weekend. Patient was instructed by Dr. Benay Spice to hold her Lithobid. Faxed lab to office [phone] with urgent message to have Dr. Clovis Pu page Dr. Benay Spice today (pager # provided on fax). Dr. Benay Spice and Dr. Clovis Pu discussed case. Dr. Clovis Pu will call patient w/dosing instructions. Requests repeat levels weekly x 2--ordered for 1/25 and 2/01.

## 2019-08-11 NOTE — Telephone Encounter (Signed)
TC from Dr. [name], oncology  Patient's lithium level on 600 mg daily in November was 0.56, however from August 08, 2019 was 2.10.  She has lost about 10 pounds and is having some nausea and vomiting.  Some mild confusion lately.  She is on several other medications that could cause nausea and vomiting.  He had her hold the lithium Friday to Sunday.  Discussed her case and how she was remarkably lithium responsive we need to try to continue the lithium if possible.  Discussed signs and symptoms of lithium toxicity which can include nausea, vomiting, balance problems, and confusion among other symptoms.  I will have my staff inform the patient to skip the lithium again today.  Then restart lithium only 300 mg nightly.  Dr. Benay Spice has agreed to repeat the lithium level next Monday. Then it needs to be repeated again in about 2 weeks. Thereafter check lithium level once monthly as long as it is stable and more often if it is unstable.  If depression recurs then please let us know.  Lynder Parents, MD, DFAPA

## 2019-08-11 NOTE — Telephone Encounter (Signed)
Called and made pt. Aware. And agrees. She verbalized understanding of instructions and will call if depression worsens.

## 2019-08-12 NOTE — Unmapped (Signed)
Bone Marrow Transplant/Cellular Therapy Outpatient Clinical Social Work Progress Note:   TC w patient [phone]) to follow up around caregiver plan; patient reported she spoke w 1 friend who is going to talk with another 2 friends around the possibility of friends helping to cover weeks as her primary caregiver; patient reported intent to talk with her husband about his sisters (1 in [place], 1 in [place]) being involved in her caregiver team; patient reports her 3 adult children are still an option, but she has not reached out to them; patient did not have any names or numbers to give to SW for SW to contact to get verbal permission. SW to follow up with Transplant Nurse Coordinator to provide update and assess next steps.     Met with Charyl Dancer, T[facility], to check in around caregiver plan; SW provided update around slow progress relating to the development of caregiver plan; Charyl Dancer reported that she will follow up with patient later this week, and encouraged SW to give patient a few weeks to get caregiver plan together; Charyl Dancer will f/u w SW after she contact patients; SW will await further information from [facility]     Nadara Eaton, LCSW   Outpatient Social Worker   [facility] Transplant Clinic   Phone: [phone]  Pager: [phone]    August 13, 2019 10:30 AM

## 2019-08-13 ENCOUNTER — Inpatient Hospital Stay: Payer: BC Managed Care – PPO

## 2019-08-13 ENCOUNTER — Other Ambulatory Visit: Payer: Self-pay

## 2019-08-13 ENCOUNTER — Inpatient Hospital Stay (HOSPITAL_BASED_OUTPATIENT_CLINIC_OR_DEPARTMENT_OTHER): Payer: BC Managed Care – PPO | Admitting: Oncology

## 2019-08-13 VITALS — BP 108/66 | HR 94 | Temp 98.0°F | Resp 18 | Ht 66.0 in | Wt 119.9 lb

## 2019-08-13 DIAGNOSIS — R5381 Other malaise: Secondary | ICD-10-CM | POA: Diagnosis not present

## 2019-08-13 DIAGNOSIS — R112 Nausea with vomiting, unspecified: Secondary | ICD-10-CM | POA: Diagnosis not present

## 2019-08-13 DIAGNOSIS — E039 Hypothyroidism, unspecified: Secondary | ICD-10-CM | POA: Diagnosis not present

## 2019-08-13 DIAGNOSIS — Z79899 Other long term (current) drug therapy: Secondary | ICD-10-CM | POA: Diagnosis not present

## 2019-08-13 DIAGNOSIS — R410 Disorientation, unspecified: Secondary | ICD-10-CM | POA: Diagnosis not present

## 2019-08-13 DIAGNOSIS — D61818 Other pancytopenia: Secondary | ICD-10-CM | POA: Diagnosis not present

## 2019-08-13 DIAGNOSIS — C9110 Chronic lymphocytic leukemia of B-cell type not having achieved remission: Secondary | ICD-10-CM | POA: Diagnosis not present

## 2019-08-13 DIAGNOSIS — M25611 Stiffness of right shoulder, not elsewhere classified: Secondary | ICD-10-CM | POA: Diagnosis not present

## 2019-08-13 DIAGNOSIS — R251 Tremor, unspecified: Secondary | ICD-10-CM | POA: Diagnosis not present

## 2019-08-13 DIAGNOSIS — R197 Diarrhea, unspecified: Secondary | ICD-10-CM | POA: Diagnosis not present

## 2019-08-13 DIAGNOSIS — Z95828 Presence of other vascular implants and grafts: Secondary | ICD-10-CM

## 2019-08-13 DIAGNOSIS — M25511 Pain in right shoulder: Secondary | ICD-10-CM | POA: Diagnosis not present

## 2019-08-13 DIAGNOSIS — M1990 Unspecified osteoarthritis, unspecified site: Secondary | ICD-10-CM | POA: Diagnosis not present

## 2019-08-13 DIAGNOSIS — D696 Thrombocytopenia, unspecified: Secondary | ICD-10-CM

## 2019-08-13 LAB — CMP (CANCER CENTER ONLY)
ALT: 17 U/L (ref 0–44)
AST: 16 U/L (ref 15–41)
Albumin: 4.2 g/dL (ref 3.5–5.0)
Alkaline Phosphatase: 167 U/L — ABNORMAL HIGH (ref 38–126)
Anion gap: 8 (ref 5–15)
BUN: 20 mg/dL (ref 6–20)
CO2: 22 mmol/L (ref 22–32)
Calcium: 9.4 mg/dL (ref 8.9–10.3)
Chloride: 109 mmol/L (ref 98–111)
Creatinine: 1.31 mg/dL — ABNORMAL HIGH (ref 0.44–1.00)
GFR, Est AFR Am: 51 mL/min — ABNORMAL LOW (ref >60)
GFR, Estimated: 44 mL/min — ABNORMAL LOW (ref >60)
Glucose, Bld: 148 mg/dL — ABNORMAL HIGH (ref 70–99)
Potassium: 4.3 mmol/L (ref 3.5–5.1)
Sodium: 139 mmol/L (ref 135–145)
Total Bilirubin: 0.6 mg/dL (ref 0.3–1.2)
Total Protein: 5.8 g/dL — ABNORMAL LOW (ref 6.5–8.1)

## 2019-08-13 LAB — CBC WITH DIFFERENTIAL (CANCER CENTER ONLY)
Abs Immature Granulocytes: 0 10*3/uL (ref 0.00–0.07)
Basophils Absolute: 0 10*3/uL (ref 0.0–0.1)
Basophils Relative: 0 %
Eosinophils Absolute: 0.1 10*3/uL (ref 0.0–0.5)
Eosinophils Relative: 1 %
HCT: 25.9 % — ABNORMAL LOW (ref 36.0–46.0)
Hemoglobin: 8.8 g/dL — ABNORMAL LOW (ref 12.0–15.0)
Immature Granulocytes: 0 %
Lymphocytes Relative: 92 %
Lymphs Abs: 4.9 10*3/uL — ABNORMAL HIGH (ref 0.7–4.0)
MCH: 28.9 pg (ref 26.0–34.0)
MCHC: 34 g/dL (ref 30.0–36.0)
MCV: 85.2 fL (ref 80.0–100.0)
Monocytes Absolute: 0.2 10*3/uL (ref 0.1–1.0)
Monocytes Relative: 4 %
Neutro Abs: 0.2 10*3/uL — CL (ref 1.7–7.7)
Neutrophils Relative %: 3 %
Platelet Count: 39 10*3/uL — ABNORMAL LOW (ref 150–400)
RBC: 3.04 MIL/uL — ABNORMAL LOW (ref 3.87–5.11)
RDW: 16.5 % — ABNORMAL HIGH (ref 11.5–15.5)
WBC Count: 5.3 10*3/uL (ref 4.0–10.5)
nRBC: 0 % (ref 0.0–0.2)

## 2019-08-13 LAB — SAMPLE TO BLOOD BANK

## 2019-08-13 LAB — FERRITIN: Ferritin: 4417 ng/mL — ABNORMAL HIGH (ref 11–307)

## 2019-08-13 MED ORDER — SODIUM CHLORIDE 0.9% FLUSH
10.0000 mL | INTRAVENOUS | Status: DC | PRN
  Filled 2019-08-13: qty 10

## 2019-08-13 NOTE — Progress Notes (Signed)
[facility] OFFICE PROGRESS NOTE   Diagnosis: CLL, aplastic anemia  INTERVAL HISTORY:   Meagan Walsh returns for a scheduled visit.  The lithium level returned elevated on 08/08/2019.  Lithium was placed on hold.  She reports improvement in nausea and tremor.  No fever.  She continues to have vaginal bleeding despite below the Mirena device.  No other bleeding.  Right shoulder pain has partially improved after a steroid injection.  She has intermittent diarrhea.  The gum hypertrophy continues to improve.  Objective:  Vital signs in last 24 hours:  Blood pressure 108/66, pulse 94, temperature 98 F (36.7 C), temperature source Temporal, resp. rate 18, height '5\' 6"'  (1.676 m), weight 119 lb 14.4 oz (54.4 kg), SpO2 100 %.    HEENT: Mild hypertrophy of the upper gums, no bleeding, no thrush Resp: Lungs clear bilaterally Cardio: Regular rate and rhythm GI: No hepatosplenomegaly, no mass, nontender Vascular: No leg edema Neuro: Alert and oriented Skin: Small ecchymoses at the left costal margin  Portacath/PICC-without erythema  Lab Results:  Lab Results  Component Value Date   WBC 5.3 08/13/2019   HGB 8.8 (L) 08/13/2019   HCT 25.9 (L) 08/13/2019   MCV 85.2 08/13/2019   PLT 39 (L) 08/13/2019   NEUTROABS 0.2 (LL) 08/13/2019    CMP  Lab Results  Component Value Date   NA 139 08/13/2019   K 4.3 08/13/2019   CL 109 08/13/2019   CO2 22 08/13/2019   GLUCOSE 148 (H) 08/13/2019   BUN 20 08/13/2019   CREATININE 1.31 (H) 08/13/2019   CALCIUM 9.4 08/13/2019   PROT 5.8 (L) 08/13/2019   ALBUMIN 4.2 08/13/2019   AST 16 08/13/2019   ALT 17 08/13/2019   ALKPHOS 167 (H) 08/13/2019   BILITOT 0.6 08/13/2019   GFRNONAA 44 (L) 08/13/2019   GFRAA 51 (L) 08/13/2019    Medications: I have reviewed the patient's current medications.   Assessment/Plan: 1.  CLL-diagnosed in August 2010, flow cytometry consistent with CLL Enlarged leftinguinal lymph node January  2019,smallneck/axillary nodes and palpable splenomegaly 09/11/2017  CTson 09/17/2017-3 cm necrotic appearing lymph node in the left inguinal region, borderline enlarged pelvic/retroperitoneal, chest, and axillary nodes. Mild splenomegaly.  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, no evidence of 11q22deletion  Bone marrow biopsy 02/26/2018-hypercellular marrow with extensive involvement by CLL, lymphocytes represent85% of all cells  Ibrutinib initiated 04/03/2018  Ibrutinib placed on hold 04/11/2018 due to onset of arthralgias  Ibrutinib resumed 04/16/2018, discontinued 04/25/2018 secondary to severe arthralgias/arthritis  Ibrutinib resumed at a dose of 140 mg daily 05/03/2018  Ibrutinib dose adjusted to 140 mg alternating with 225m 06/25/2018  Ibrutinib discontinued 07/03/2018 secondary to severe arthralgias  Acalabrutinib 08/16/2018, discontinued 11/15/2018 secondary to persistent severe transfusion dependent anemia and neutropenia/thrombocytopenia, last dose 11/14/2018  Bone marrow biopsy 11/21/2018-decreased cellularity, involvement by CLL, decreased erythroid and granulocytic precursors, decreased megakaryocytes  Cycle 1 rituximab 12/06/2018  Bone marrow biopsy 12/24/2018 at [facility]-hypocellular bone marrow (10%) involved by CLL, representing 50% of marrow cellularity; markedly decreased trilineage hematopoiesis including essentially absent  erythropoiesis  Bone marrow biopsy  03/06/2027 [facility]-hypocellular bone marrow (20%) with marked reduction of maturing hematopoietic elements; numerous lymphoid aggregates consistent with involvement by CLL, representing approximately 50% of marrow cellularity by flow cytometry  ATG/cyclosporine at [facility] beginning 03/19/2019 followed by prednisone taper  Eltrombopag beginning 07/05/2019  Cyclosporine discontinued 07/08/2019  Tacrolimus 07/10/2019  2.Hypothyroidism 3.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 Augmentin prescribed. 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 Pancytopenia secondary to CLL and a hypocellular bone marrow  G-CSF and Nplate started 0/92/3300, G-CSF changed to daily beginning 12/17/2018; G-CSF discontinued 12/31/2018, last Nplate 01/15/2019  Began prednisone 60 mg daily 01/11/19, tolerating moderately well except jitteriness, irritability, and difficulty sleeping. Tapered to 40 mg daily x1 week starting 01/24/19, reduce by 10 mg each week until discontinued. Prednisone discontinued 02/20/2019.  Promacta started 01/20/2019, dose increased to 100 mg daily 02/04/2019; dose increased to 150 mg daily 02/21/2019  IVIG daily for 2 days beginning 02/25/2019   9. 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 No lymphadenopathy or splenomegaly.  Exjade  beginning 06/28/2019. 13.  Elevated creatinine-cyclosporine toxicity?  Persistent 14.  Vaginal bleeding beginning 07/13/2019-Mirena device removed 15.  Elevated lithium level 08/08/2019, lithium toxicity?-Lithium held 08/08/2019-08/12/2019, then resumed at a lower dose      Disposition: Ms. Beaubien has an overall improved performance status today.  Nausea has improved since discontinuing lithium on 08/08/2019.  It is possible some of her symptoms including, confusion, and a tremor last week are related to lithium toxicity.  I discussed the case with Dr. Clovis Pu.  He recommends resuming lithium at a dose of 300 mg each night.  She will resume the lithium tonight.  We will check a lithium level when she returns for labs next week.  The neutrophil count is stable.  The platelets have improved.  She last received a red cell transfusion 10 days ago.  There is no indication for transfusion today.  Ms. Fill is scheduled for a video visit with Dr. Prince Solian on 08/19/2019.  She will be seen here for a lab visit that morning.  She will be scheduled for an office and lab visit on 08/26/2019.  We will follow-up on the tacrolimus level from today.  We checked a ferritin level today.  Betsy Coder, MD  08/13/2019  1:18 PM

## 2019-08-15 ENCOUNTER — Encounter: Payer: Self-pay | Admitting: *Deleted

## 2019-08-15 ENCOUNTER — Telehealth: Payer: Self-pay | Admitting: Oncology

## 2019-08-15 LAB — TACROLIMUS LEVEL: Tacrolimus (FK506) - LabCorp: 8.4 ng/mL (ref 2.0–20.0)

## 2019-08-15 NOTE — Telephone Encounter (Signed)
Scheduled per los. Called and spoke with patient. Confirmed appts  

## 2019-08-15 NOTE — Progress Notes (Signed)
Labs from 08/13/19 including Tacrolimus level was faxed to [place] [phone] att: Joellen Jersey, PharmD.

## 2019-08-16 NOTE — Unmapped (Signed)
Recent background: On 12/15, Susan Kane was switched from cyclosporine to tacrolimus. This was mainly due to the limitations of nausea/vomiting on her levels and quality of life. If she were to get a transplant she would need to tolerate immunosuppression therapy, and so therefore the decision was made to switch her to tacrolimus.     She was started on 1 mg BID on 12/15. This dose was determined based off a target dose of 0.03 mg/kg BID with a 50% empiric dose reduction for the posaconazole interaction. She continues on 1 mg BID with no dose adjustments needed yet.    Patient's tacrolimus level from 1/20 came back 8.4 ng/mL (goal 5-15). She should continue on tac 1 mg BID.  Her SCr continues to be elevated to 1.31, but has improved as low as 1.19 over the last 1-2 weeks. Her LFTs are WNL. CBC continues to shoe low counts (ANC 0.2, PLT 39, Hgb 8.8). Of note, she is currently on Exjade 1000 mg daily and Promacta 150 mg daily.      She should remain on ppx with posaconazole indefinitely to avoid stopping/starting which impacts tacrolimus metabolism and levels. She should continue other ppx of levofloxacin while ANC < 0.5, and valtrex and Bactrim indefinitely. Should continue to get weekly tacrolimus troughs locally. I will continue to follow.       Manfred Arch, PharmD, BCOP, CPP  Pager: [phone]

## 2019-08-17 ENCOUNTER — Other Ambulatory Visit: Payer: Self-pay | Admitting: Oncology

## 2019-08-18 NOTE — Unmapped (Signed)
I called and spoke with Susan Kane. We discussed her new FAV scheduled for 09/03/19 with our APP and return clinic visit with Dr. Oswald Hillock on 09/09/19. She verbalized understanding.  We also discussed her need to have a caregiver plan, and she stated that since she spoke with our se Juanell Fairly she has been able to come up with a plan.  I told her that I would share this with Irving Burton so she could follow up with her.  I also asked if she had completed her dental clearance and she said no.  I reinforced the need to complete this in a timely manner.  She stated that she would make an appointment with her dentist.  My contact information was reviewed and she will call me with any questions or concerns.

## 2019-08-19 ENCOUNTER — Encounter
Admit: 2019-08-19 | Discharge: 2019-08-19 | Payer: PRIVATE HEALTH INSURANCE | Attending: Hematology & Oncology | Primary: Hematology & Oncology

## 2019-08-19 ENCOUNTER — Encounter
Admit: 2019-08-19 | Discharge: 2019-08-19 | Payer: PRIVATE HEALTH INSURANCE | Attending: Pharmacist | Primary: Pharmacist

## 2019-08-19 ENCOUNTER — Inpatient Hospital Stay: Payer: BC Managed Care – PPO

## 2019-08-19 ENCOUNTER — Other Ambulatory Visit: Payer: Self-pay | Admitting: *Deleted

## 2019-08-19 ENCOUNTER — Telehealth: Payer: Self-pay | Admitting: Nurse Practitioner

## 2019-08-19 ENCOUNTER — Other Ambulatory Visit: Payer: Self-pay

## 2019-08-19 DIAGNOSIS — R5381 Other malaise: Secondary | ICD-10-CM | POA: Diagnosis not present

## 2019-08-19 DIAGNOSIS — D619 Aplastic anemia, unspecified: Secondary | ICD-10-CM | POA: Diagnosis not present

## 2019-08-19 DIAGNOSIS — E039 Hypothyroidism, unspecified: Secondary | ICD-10-CM | POA: Diagnosis not present

## 2019-08-19 DIAGNOSIS — C9110 Chronic lymphocytic leukemia of B-cell type not having achieved remission: Secondary | ICD-10-CM

## 2019-08-19 DIAGNOSIS — M1990 Unspecified osteoarthritis, unspecified site: Secondary | ICD-10-CM | POA: Diagnosis not present

## 2019-08-19 DIAGNOSIS — Z79899 Other long term (current) drug therapy: Secondary | ICD-10-CM | POA: Diagnosis not present

## 2019-08-19 DIAGNOSIS — Z95828 Presence of other vascular implants and grafts: Secondary | ICD-10-CM

## 2019-08-19 DIAGNOSIS — D649 Anemia, unspecified: Secondary | ICD-10-CM

## 2019-08-19 DIAGNOSIS — D696 Thrombocytopenia, unspecified: Secondary | ICD-10-CM

## 2019-08-19 DIAGNOSIS — M25611 Stiffness of right shoulder, not elsewhere classified: Secondary | ICD-10-CM | POA: Diagnosis not present

## 2019-08-19 DIAGNOSIS — R197 Diarrhea, unspecified: Secondary | ICD-10-CM | POA: Diagnosis not present

## 2019-08-19 DIAGNOSIS — D61818 Other pancytopenia: Secondary | ICD-10-CM | POA: Diagnosis not present

## 2019-08-19 DIAGNOSIS — M25511 Pain in right shoulder: Secondary | ICD-10-CM | POA: Diagnosis not present

## 2019-08-19 DIAGNOSIS — R251 Tremor, unspecified: Secondary | ICD-10-CM | POA: Diagnosis not present

## 2019-08-19 DIAGNOSIS — R112 Nausea with vomiting, unspecified: Secondary | ICD-10-CM | POA: Diagnosis not present

## 2019-08-19 DIAGNOSIS — R410 Disorientation, unspecified: Secondary | ICD-10-CM | POA: Diagnosis not present

## 2019-08-19 LAB — LITHIUM LEVEL: Lithium Lvl: 0.91 mmol/L (ref 0.60–1.20)

## 2019-08-19 LAB — CBC WITH DIFFERENTIAL (CANCER CENTER ONLY)
Abs Immature Granulocytes: 0.04 10*3/uL (ref 0.00–0.07)
Basophils Absolute: 0 10*3/uL (ref 0.0–0.1)
Basophils Relative: 0 %
Eosinophils Absolute: 0 10*3/uL (ref 0.0–0.5)
Eosinophils Relative: 1 %
HCT: 23.3 % — ABNORMAL LOW (ref 36.0–46.0)
Hemoglobin: 7.7 g/dL — ABNORMAL LOW (ref 12.0–15.0)
Immature Granulocytes: 1 %
Lymphocytes Relative: 86 %
Lymphs Abs: 2.8 10*3/uL (ref 0.7–4.0)
MCH: 28.6 pg (ref 26.0–34.0)
MCHC: 33 g/dL (ref 30.0–36.0)
MCV: 86.6 fL (ref 80.0–100.0)
Monocytes Absolute: 0.2 10*3/uL (ref 0.1–1.0)
Monocytes Relative: 7 %
Neutro Abs: 0.2 10*3/uL — CL (ref 1.7–7.7)
Neutrophils Relative %: 5 %
Platelet Count: 40 10*3/uL — ABNORMAL LOW (ref 150–400)
RBC: 2.69 MIL/uL — ABNORMAL LOW (ref 3.87–5.11)
RDW: 16.8 % — ABNORMAL HIGH (ref 11.5–15.5)
WBC Count: 3.3 10*3/uL — ABNORMAL LOW (ref 4.0–10.5)
nRBC: 0 % (ref 0.0–0.2)

## 2019-08-19 LAB — CMP (CANCER CENTER ONLY)
ALT: 14 U/L (ref 0–44)
AST: 12 U/L — ABNORMAL LOW (ref 15–41)
Albumin: 4 g/dL (ref 3.5–5.0)
Alkaline Phosphatase: 123 U/L (ref 38–126)
Anion gap: 8 (ref 5–15)
BUN: 19 mg/dL (ref 6–20)
CO2: 23 mmol/L (ref 22–32)
Calcium: 9.3 mg/dL (ref 8.9–10.3)
Chloride: 108 mmol/L (ref 98–111)
Creatinine: 1.44 mg/dL — ABNORMAL HIGH (ref 0.44–1.00)
GFR, Est AFR Am: 46 mL/min — ABNORMAL LOW (ref >60)
GFR, Estimated: 39 mL/min — ABNORMAL LOW (ref >60)
Glucose, Bld: 144 mg/dL — ABNORMAL HIGH (ref 70–99)
Potassium: 4 mmol/L (ref 3.5–5.1)
Sodium: 139 mmol/L (ref 135–145)
Total Bilirubin: 0.6 mg/dL (ref 0.3–1.2)
Total Protein: 5.7 g/dL — ABNORMAL LOW (ref 6.5–8.1)

## 2019-08-19 MED ORDER — SODIUM CHLORIDE 0.9% FLUSH
10.0000 mL | INTRAVENOUS | Status: DC | PRN
  Administered 2019-08-19: 11:00:00 10 mL
  Filled 2019-08-19: qty 10

## 2019-08-19 MED ORDER — HEPARIN SOD (PORK) LOCK FLUSH 100 UNIT/ML IV SOLN
500.0000 [IU] | Freq: Once | INTRAVENOUS | Status: AC | PRN
  Administered 2019-08-19: 11:00:00 250 [IU]
  Filled 2019-08-19: qty 5

## 2019-08-19 NOTE — Unmapped (Signed)
[facility] Follow-up: virtual visit      Patient Name: Susan Kane  Patient Age: 59 y.o.  Encounter Date: 08/19/2019    Primary Care Provider:  Thora Lance, MD    Referring Physician:  Referred Self  Merrily Pew,  [place]    Chief complaint: Here for follow up of : CLL now with aplastic anemia (possibly 2/t acalabrutinib)    Assessment:  Susan Kane is a 59 y.o. year old female with history of CLL initially treated with ibrutinib. Unfortunately she was not able to tolerate this due to severe arthralgias. Therapy was changed to acalabrutinib, which was much better tolerated with no recurrence of arthralgias, but with progressive bone marrow failure as evidenced by ongoing transfusion dependence, worsening thrombocytopenia and neutropenia with marrow cellularity 5% (when not counting CLL cells).  ??  Reviewed pt's case with Dr. [name] and also presented at our multidisciplinary hem malignancy conference on multiple occasions - while not typical, her course was concerning for development of aplastic anemia possibly due to the acalabrutinib.     Acalabrutinib was discontinued and she was placed on Promacta as well as corticosteroids in addition to a x1 dose of Rituximab. This resulted in a brief but not durable response.     BMBx was performed on 8/14 which demonstrated  A hypocellular bone marrow (20%) with marked reduction of maturing hematopoietic elements. Numerous lymphoid aggregates, consistent with involvement by chronic lymphocytic leukemia, representing ~50% of marrow cellularity by flow cytometry. No clonal abnormalities to suggest MDS.    Proceeded with ATG-cyclosporine (8/26=Day 1) and she tolerated the ATG with just a mild reaction on day 1.    However she has experienced significant n/v while on cyclosporine and her levels have been intermittently subtherapeutic despite dose adjustments by our pharmacist. In addition, she remains quite neutropenic and thrombocytopenic. Changed to tacrolimus as a result (and also because would be helpful to know she can tolerate this drug if we go BMT route) and pt is doing much better from a N/V perspective. Also added back eltrombopag on 12/7 in event the triplet is helpful for her (had not added given lack of prior response to it as monotherapy).    Plts up a bit since starting the eltrombopag back up but ANC remains in the very severe range, ~0.1-0.2 on past several checks. As she is 5 months post ATG I noted my enthusiasm for seeing improvement from this regimen gets lower and lower and that I am leaning toward BMT as her likely best long term bet.    BMT provider noted may be good to get CLL at lower level pre-BMT. Once get a date for BMT may coordinate with Dr. [name] vs here to do venetoclax to debulk remaining CLL. Would do CT prior to make sure no bulky internal nodes and if not likely would be low TLS risk and could do ramp up outpt (provided renal function remains in reasonably acceptable level).    I spent 16 minutes on the video with the patient on the date of service. I spent an additional 15 minutes on pre- and post-visit activities.     The patient was physically located in [place] or a state in which I am permitted to provide care. The patient and/or parent/guardian understood that s/he may incur co-pays and cost sharing, and agreed to the telemedicine visit. The visit was reasonable and appropriate under the circumstances given the patient's presentation at the time.    The  patient and/or parent/guardian has been advised of the potential risks and limitations of this mode of treatment (including, but not limited to, the absence of in-person examination) and has agreed to be treated using telemedicine. The patient's/patient's family's questions regarding telemedicine have been answered.     If the visit was completed in an ambulatory setting, the patient and/or parent/guardian has also been advised to contact their provider???s office for worsening conditions, and seek emergency medical treatment and/or call 911 if the patient deems either necessary.      Plan and Recommendations:   1. Aplastic Anemia  - cont tacrolimus, weekly levels  - cont posaconazole 300mg  daily - plan to continue even if ANC >0.5 with goal of maintaining cyclosporine level stability  - Continue levofloxacin ppx while ANC <0.5   - Continue bactrim and valtrex (usually on for about a year after ATG, can check CD4 level in several months to help determine when ok to stop)  - defer to PharmD on tacro monitoring -> had visit with Dr Fredderick Phenix today but pt did not have med list with her so she will call back when pt with husband who helps with meds to do appropriate med review  - appreciate BMT team input, sending note to Dr Oswald Hillock for awareness/input  - rtc in 4 weeks, NP visit but can cancel if goes BMT route (vs reschedule earlier if we hear that BMT wanting to do transplant sooner and we need to get ven going sooner)    2. Iron overload  - exjade for iron overload     3. CLL  - hold CLL directed therapy but as above, have discussed may re-initiate prior to BMT to debulk remaining disease leading up to BMT if we go BMT route (would likely use venetoclax but this will likely markedly worsen her counts so no rush to start)    4. Nausea: improved on tacrolimus but still an issue, prn and scheduled meds as prior    5. AKI  - appears stable to improved        08/19/19    History of Present Illness:  Oncology History Overview Note   CLL:    Summary as per Dr. Kalman Drape most recent note with minor edits/additions from my review of records    1.??CLL?????diagnosed in August 2010, flow cytometry consistent with CLL  ?? Enlarged left??inguinal lymph node January 2019,??small??neck/axillary nodes and palpable splenomegaly 09/11/2017  ?? CTs??on 09/17/2017-3 cm necrotic appearing lymph node in the left inguinal region, borderline enlarged pelvic/retroperitoneal, chest, and axillary nodes. ??Mild splenomegaly.  ?? Ultrasound-guided biopsy of the left inguinal lymph node 09/18/2017, slightly purulent fluid aspirated, core biopsy is consistent with an atypical lymphoid proliferation???extensive necrosis with surrounding epithelioid histiocytes, limited intact lymphoid tissue involved with CLL  ?? Incisional biopsy of a necrotic/purulent left inguinal lymph node on 10/01/2017???extensive necrosis with granulomatous inflammation, small amount of viable lymphoid tissue involved with CLL, AFB and fungal stains negative  ?? Peripheral blood FISH analysis 02/05/2018??? +deletion 13q14, no evidence of p53 (17p13) deletion, no evidence of 11q22??deletion  - normal karyotype (46,XX)  ?? Bone marrow biopsy 02/26/2018???hypercellular marrow with extensive involvement by CLL, lymphocytes represent??85% of all cells  ?? Ibrutinib initiated 04/03/2018  ?? Ibrutinib placed on hold 04/11/2018 due to onset of arthralgias  ?? Ibrutinib resumed 04/16/2018, discontinued 04/25/2018 secondary to severe arthralgias/arthritis  ?? Ibrutinib resumed at a dose of 140 mg daily 05/03/2018  ?? Ibrutinib dose adjusted to 140 mg alternating with 280 mg 06/25/2018  ??  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 ??Augmentin prescribed.  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    Baseline BM bx reviewed at [facility]  Final Diagnosis   (Outside Case #:  [id number], dated 02/26/2018)  Bone marrow, aspiration and biopsy  -  Hypercellular bone marrow (80%) with extensive involvement by chronic lymphocytic leukemia (87% lymphocytes by manual aspirate differential) (See Comment)  -  By outside report, cytogenetic results are normal     Karyotype: 53, XX and FISH with 13q del but no 11q or 17p del    Repeat BM bx: (done after being off ibrutinib x1 month)  80% involvement by CLL    08/13/18: Presents to [facility] to discuss plan of care     Chronic lymphocytic leukemia (CLL), B-cell (CMS-HCC)   07/12/2018 Initial Diagnosis    Chronic lymphocytic leukemia (CLL), B-cell (CMS-HCC)     08/26/2018 -  Chemotherapy    Acalabrutinib started (approximate date)     11/14/2018 -  Chemotherapy    Acalabrutinib discontinued due to progressive bone marrow failure     12/06/2018 -  Chemotherapy    Ritux x1 given due to CLL still making up majority of BM cellularity (though in the setting of near aplasia of rest of TLH)     03/19/2019 -  Chemotherapy    [facility] ATG / CsA for aplastic anemia (suspect 2/t acalabrutinib)         Interval History:  She thinks has done better with the tacro from a n/v perspective - only emesis around once a week now    Appetite not great, has lost ~20 lbs past several months    Otherwise, she denies new constitutional symptoms such as night sweats or unexplained fevers.  Furthermore, she denies recurrent or unexplained intercurrent infections, dyspnea on exertion, lightheadedness, palpitations or chest pain.  There have been no new or unexplained pains or self-identified masses, swelling or enlarged lymph nodes.    Past Medical, Surgical and Family History were reviewed and pertinent updates were made in the Electronic Medical Record    Review of Systems:  Other than as reported above in the interim history, the balance of a full 12-system review was performed and unremarkable.    ECOG Performance Status: 1    Past Medical History:  Past Medical History:   Diagnosis Date   ??? Anxiety    ??? Depression    ??? Hypothyroid        Medications:    Current Outpatient Medications   Medication Sig Dispense Refill   ??? acetaminophen (TYLENOL) 325 MG tablet Take 650 mg by mouth every six (6) hours as needed for pain.     ??? ALPRAZolam (XANAX) 0.25 MG tablet 0.25 mg daily as needed.      ??? cyclobenzaprine (FLEXERIL) 10 MG tablet TAKE 1 TABLET BY MOUTH 2 TIMES DAILY AS NEEDED FOR MUSCLE SPASMS. DO NOT TAKE WITH XANAX OR AMBIEN     ??? cycloSPORINE modified (NEORAL) 25 MG capsule cyclosporine modified 25 mg capsule     ??? deferasirox (EXJADE) 500 MG disintegrating tablet Take 1,000 mg by mouth.     ??? eltrombopag (PROMACTA) 50 MG tablet Take 150 mg by mouth.     ??? folic  acid (FOLVITE) 1 MG tablet Take 1 mg by mouth daily.      ??? levoFLOXacin (LEVAQUIN) 500 MG tablet Take 1 tablet (500 mg total) by mouth daily. 30 tablet 2   ??? levonorgestrel (MIRENA) 20 mcg/24 hours (5 yrs) 52 mg IUD 1 each by Intrauterine route.     ??? levothyroxine (SYNTHROID) 100 MCG tablet Take 100 mcg by mouth daily.      ??? lithium (LITHOBID) 300 MG ER tablet Take 600 mg by mouth at bedtime.     ??? LORazepam (ATIVAN) 0.5 MG tablet lorazepam 0.5 mg tablet   TAKE 1 TABLET BY MOUTH TWICE A DAY PRIOR TO CYCLOSPORINE DOSE     ??? ondansetron (ZOFRAN-ODT) 8 MG disintegrating tablet Take 8 mg by mouth every eight (8) hours as needed for nausea.     ??? posaconazole (NOXAFIL) 100 mg TbEC delayed released tablet Take 300 mg by mouth daily. 90 tablet 2   ??? predniSONE (DELTASONE) 5 MG tablet prednisone 5 mg tablets in a dose pack   TAKE AS DIRECTED     ??? prochlorperazine (COMPAZINE) 5 MG tablet Take 5 mg by mouth every six (6) hours as needed.      ??? sulfamethoxazole-trimethoprim (BACTRIM DS) 800-160 mg per tablet TAKE 1 TABLET BY MOUTH TWICE A DAY ON SATURDAY AND SUNDAY     ??? SUMAtriptan (IMITREX) 20 mg/actuation nasal spray USE 1 SPRAY IN THE NOSE DAILY AS NEEDED FOR MIGRAINE     ??? tacrolimus (PROGRAF) 1 MG capsule Take 1 capsule (1 mg total) by mouth two (2) times a day. 60 capsule 2   ??? TRINTELLIX 20 mg tablet Take 20 mg by mouth daily.      ??? valACYclovir (VALTREX) 500 MG tablet Take 500 mg by mouth daily.      ??? zolpidem (AMBIEN) 10 mg tablet Take 10 mg by mouth nightly.        No current facility-administered medications for this visit.        Vital Signs:  BSA: There is no height or weight on file to calculate BSA.  There were no vitals filed for this visit.         Physical Exam:  There were no vitals filed for this visit.    LIMITED EXAM DUE TO ATTEMPT TO LIMIT CONTACT DUE TO CORONAVIRUS IN THE COMMUNITY     GENERAL:  The patient sounds well and is in no acute distress.  RESP: Breathing comfortably and unlabored speech.    NEURO: A&Ox4  PSYCH: Normal affect        Relevant Laboratory, radiology and pathology results:  Results for orders placed or performed during the hospital encounter of 07/29/19   Spirometry   Result Value Ref Range    FVC PRE 3.44 2.59 - 4.24 L    FEV1 PRE 2.50 2.03 - 3.33 L    FEV1/FVC PRE 72.57 67.09 - 91.03 %    FEV6 PRE 3.27 2.75 - 4.20 L    FEV1/FEV6 PRE 76.36 72.09 - 89.69 %    FEF25-75% PRE 1.86 1.22 - 3.54 L/s    ISOFEF25-75 PRE 1.86 L/s    FEF50% PRE 2.33 1.99 - 5.61 L/s    PEF PRE 5.86 4.85 - 8.51 L/s    FET100% Change 9.69 sec    FIVC PRE 3.27 2.59 - 4.24 L    FIF50% PRED 3.90 L/s    ZOX/WRU04 pre 59.77 %    PIF PRE 4.04 2.85 - 7.65 L/s  Vol extrap pre 0.05 L    DLCO PRE 11.94 (L) 16.55 - 26.91 ml/(min*mmHg)    DL Adj PRE 54.09 (L) 81.19 - 26.91 ml/(min*mmHg)    DLCO/VA POST 2.53 (L) 3.24 - 5.16 ml/(min*mmHg*L)    DL/VA Adj PRE 1.47 (L) 8.29 - 5.16 ml/(min*mmHg*L)    VA PRE 4.72 4.22 - 6.26 L    IVC PRE 3.29 2.59 - 4.24 L    BHT POST 10.92 sec   Diffusion Studies   Result Value Ref Range    FVC PRE 3.44 2.59 - 4.24 L    FEV1 PRE 2.50 2.03 - 3.33 L    FEV1/FVC PRE 72.57 67.09 - 91.03 %    FEV6 PRE 3.27 2.75 - 4.20 L    FEV1/FEV6 PRE 76.36 72.09 - 89.69 %    FEF25-75% PRE 1.86 1.22 - 3.54 L/s    ISOFEF25-75 PRE 1.86 L/s    FEF50% PRE 2.33 1.99 - 5.61 L/s    PEF PRE 5.86 4.85 - 8.51 L/s    FET100% Change 9.69 sec    FIVC PRE 3.27 2.59 - 4.24 L    FIF50% PRED 3.90 L/s    FAO/ZHY86 pre 59.77 %    PIF PRE 4.04 2.85 - 7.65 L/s Vol extrap pre 0.05 L    DLCO PRE 11.94 (L) 16.55 - 26.91 ml/(min*mmHg)    DL Adj PRE 57.84 (L) 69.62 - 26.91 ml/(min*mmHg)    DLCO/VA POST 2.53 (L) 3.24 - 5.16 ml/(min*mmHg*L)    DL/VA Adj PRE 9.52 (L) 8.41 - 5.16 ml/(min*mmHg*L)    VA PRE 4.72 4.22 - 6.26 L    IVC PRE 3.29 2.59 - 4.24 L    BHT POST 10.92 sec

## 2019-08-19 NOTE — Unmapped (Signed)
RN returned call to pt who is calling because she and Psychologist, counselling, CPP had made a phone appt for 4pm so that the pt's husband would be home and could participate in the call. The pt's husband called and said he is running a few minutes late, so the pt is wanting to know if Florentina Addison can call at 430 or if she had rather reschedule for another day.    RN contacted Kendal Hymen with question and Florentina Addison advised she would call the pt at 430 this afternoon.  Pt states understanding and is VERY appreciative.

## 2019-08-19 NOTE — Unmapped (Signed)
Hi,     Susan Kane contacted the [employer] requesting to speak with the care team of Susan Kane to discuss:    Patient called to speak to [facility] about appointment that was just made.    Please contact [place] at [phone].        Check Indicates criteria has been reviewed and confirmed with the patient:    [x]  Preferred Name   [x]  DOB and/or MR#  [x]  Preferred Contact Method  [x]  Phone Number(s)   []  MyChart     Thank you,   [name]  [facility]   [phone]

## 2019-08-19 NOTE — Unmapped (Signed)
Reviewed Meds and Allergies, confirmed pharmacy and informed how to use Doximity.

## 2019-08-19 NOTE — Unmapped (Signed)
Pharmacy Telephone Follow-up:    This patient visit was completed through the use of an audio/video or telephone encounter.      This patient encounter is appropriate and reasonable under the circumstances given the patient's particular presentation at this time. The patient has been advised of the potential risks and limitations of this mode of treatment (including, but not limited to, the absence of in-person examination) and has agreed to be treated in a remote fashion in spite of them. Any and all of the patient's/patient's family's questions on this issue have been answered.      The patient has also been advised to contact this office for worsening conditions or problems, and seek emergency medical treatment and/or call 911 if the patient deems either necessary.  __________________________     Susan Kane is a 59 y.o. 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 She did not tolerate CSA, due to nausea/vomiting and erratic levels. She was switched to tacrolimus mid-December (on drug for 5-6 weeks) and is feeling much better. Her nausea has been controlled by once daily zofran and nightly ativan (which also helps her sleep). She only vomits about once per week. Her current dose is 1 mg BID and she has been at goal (5-15; most recent level from last week was 8.4. She notes a tremor, but also recently had a supratherapetuic lithium level that may be contributing. Denies HA, elevated BP, rash, gums are improved. She is on a variety of other medications to support her AA. Exjade and Promacta are prescribed locally and copays have not been an issue. Of note related to iron chelation, she has had her ears checked and plans on making an eye appointment soon. Last ferritin was > 4000 on 1/20. She has been taking Promacta on an empty stomach when she takes her synthroid. She otherwise denies bleeding and fevers.     Labs from today show Hgb 7.7, PLT 40, ANC 0.2. Her CMP continues to show fluctuating SCr which is up to 1.44 today, but was recently as low as 1.19. Mg from 1/5 was 2.0. LFTs normal.    Oncologic History:  Oncology History Overview Note   CLL:    Summary as per Dr. Kalman Drape most recent note with minor edits/additions from my review of records    1.??CLL?????diagnosed in August 2010, flow cytometry consistent with CLL  ?? Enlarged left??inguinal lymph node January 2019,??small??neck/axillary nodes and palpable splenomegaly 09/11/2017  ?? CTs??on 09/17/2017-3 cm necrotic appearing lymph node in the left inguinal region, borderline enlarged pelvic/retroperitoneal, chest, and axillary nodes. ??Mild splenomegaly.  ?? Ultrasound-guided biopsy of the left inguinal lymph node 09/18/2017, slightly purulent fluid aspirated, core biopsy is consistent with an atypical lymphoid proliferation???extensive necrosis with surrounding epithelioid histiocytes, limited intact lymphoid tissue involved with CLL  ?? Incisional biopsy of a necrotic/purulent left inguinal lymph node on 10/01/2017???extensive necrosis with granulomatous inflammation, small amount of viable lymphoid tissue involved with CLL, AFB and fungal stains negative  ?? Peripheral blood  FISH analysis 02/05/2018??? +deletion 13q14, no evidence of p53 (17p13) deletion, no evidence of 11q22??deletion  - normal karyotype (46,XX)  ?? Bone marrow biopsy 02/26/2018???hypercellular marrow with extensive involvement by CLL, lymphocytes represent??85% of all cells  ?? Ibrutinib initiated 04/03/2018  ?? Ibrutinib placed on hold 04/11/2018 due to onset of arthralgias  ?? Ibrutinib resumed 04/16/2018, discontinued 04/25/2018 secondary to severe arthralgias/arthritis  ?? Ibrutinib resumed at a dose of 140 mg daily 05/03/2018  ?? Ibrutinib dose adjusted to 140 mg alternating with 280 mg 06/25/2018  ?? 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 ??Augmentin prescribed.  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    Baseline BM bx reviewed at [facility]  Final Diagnosis   (Outside Case #:  [id number], dated 02/26/2018)  Bone marrow, aspiration and biopsy  -  Hypercellular bone marrow (80%) with extensive involvement by chronic lymphocytic leukemia (87% lymphocytes by manual aspirate differential)  (See Comment)  -  By outside report, cytogenetic results are normal     Karyotype: 15, XX and FISH with 13q del but no 11q or 17p del    Repeat BM bx: (done after being off ibrutinib x1 month)  80% involvement by CLL    08/13/18: Presents to [facility] to discuss plan of care     Chronic lymphocytic leukemia (CLL), B-cell (CMS-HCC)   07/12/2018 Initial Diagnosis    Chronic lymphocytic leukemia (CLL), B-cell (CMS-HCC)     08/26/2018 -  Chemotherapy    Acalabrutinib started (approximate date)     11/14/2018 -  Chemotherapy    Acalabrutinib discontinued due to progressive bone marrow failure     12/06/2018 -  Chemotherapy    Ritux x1 given due to CLL still making up majority of BM cellularity (though in the setting of near aplasia of rest of TLH)     03/19/2019 -  Chemotherapy    Equine ATG / CsA for aplastic anemia (suspect 2/t acalabrutinib)         Weight and Vitals:  Wt Readings from Last 3 Encounters: 07/29/19 57.2 kg (126 lb)   06/10/19 63 kg (139 lb)   04/03/19 63.5 kg (139 lb 15.9 oz)     Temp Readings from Last 3 Encounters:   07/29/19 36.4 ??C (97.6 ??F) (Temporal)   06/10/19 36.8 ??C (98.2 ??F) (Oral)   04/03/19 36.8 ??C (98.2 ??F) (Oral)     BP Readings from Last 3 Encounters:   07/29/19 103/53   06/10/19 106/66   04/03/19 114/70     Pulse Readings from Last 3 Encounters:   07/29/19 81   06/10/19 97   04/03/19 81       Pertinent Labs:  No visits with results within 1 Day(s) from this visit.   Latest known visit with results is:   Hospital Outpatient Visit on 07/29/2019   Component Date Value Ref Range Status   ??? FVC PRE 07/29/2019 3.44  2.59 - 4.24 L Final   ??? FEV1 PRE 07/29/2019 2.50  2.03 - 3.33 L Final   ??? FEV1/FVC PRE 07/29/2019 72.57  67.09 - 91.03 % Final   ??? FEV6 PRE 07/29/2019 3.27  2.75 - 4.20 L Final   ??? FEV1/FEV6 PRE 07/29/2019 76.36  72.09 - 89.69 % Final   ???  FEF25-75% PRE 07/29/2019 1.86  1.22 - 3.54 L/s Final   ??? ISOFEF25-75 PRE 07/29/2019 1.86  L/s Final   ??? FEF50% PRE 07/29/2019 2.33  1.99 - 5.61 L/s Final   ??? PEF PRE 07/29/2019 5.86  4.85 - 8.51 L/s Final   ??? FET100% Change 07/29/2019 9.69  sec Final   ??? FIVC PRE 07/29/2019 3.27  2.59 - 4.24 L Final   ??? FIF50% PRED 07/29/2019 3.90  L/s Final   ??? NFA/OZH08 pre 07/29/2019 59.77  % Final   ??? PIF PRE 07/29/2019 4.04  2.85 - 7.65 L/s Final   ??? Vol extrap pre 07/29/2019 0.05  L Final   ??? DLCO PRE 07/29/2019 11.94* 16.55 - 26.91 ml/(min*mmHg) Final   ??? DL Adj PRE 65/78/4696 29.52* 16.55 - 26.91 ml/(min*mmHg) Final   ??? DLCO/VA POST 07/29/2019 2.53* 3.24 - 5.16 ml/(min*mmHg*L) Final   ??? DL/VA Adj PRE 84/13/2440 1.02* 3.24 - 5.16 ml/(min*mmHg*L) Final   ??? VA PRE 07/29/2019 4.72  4.22 - 6.26 L Final   ??? IVC PRE 07/29/2019 3.29  2.59 - 4.24 L Final   ??? BHT POST 07/29/2019 10.92  sec Final   ??? FVC PRE 07/29/2019 3.44  2.59 - 4.24 L Final   ??? FEV1 PRE 07/29/2019 2.50  2.03 - 3.33 L Final   ??? FEV1/FVC PRE 07/29/2019 72.57  67.09 - 91.03 % Final   ??? FEV6 PRE 07/29/2019 3.27  2.75 - 4.20 L Final   ??? FEV1/FEV6 PRE 07/29/2019 76.36  72.09 - 89.69 % Final   ??? FEF25-75% PRE 07/29/2019 1.86  1.22 - 3.54 L/s Final   ??? ISOFEF25-75 PRE 07/29/2019 1.86  L/s Final   ??? FEF50% PRE 07/29/2019 2.33  1.99 - 5.61 L/s Final   ??? PEF PRE 07/29/2019 5.86  4.85 - 8.51 L/s Final   ??? FET100% Change 07/29/2019 9.69  sec Final   ??? FIVC PRE 07/29/2019 3.27  2.59 - 4.24 L Final   ??? FIF50% PRED 07/29/2019 3.90  L/s Final   ??? VOZ/DGU44 pre 07/29/2019 59.77  % Final   ??? PIF PRE 07/29/2019 4.04  2.85 - 7.65 L/s Final   ??? Vol extrap pre 07/29/2019 0.05  L Final   ??? DLCO PRE 07/29/2019 11.94* 16.55 - 26.91 ml/(min*mmHg) Final   ??? DL Adj PRE 03/47/4259 56.38* 16.55 - 26.91 ml/(min*mmHg) Final   ??? DLCO/VA POST 07/29/2019 2.53* 3.24 - 5.16 ml/(min*mmHg*L) Final   ??? DL/VA Adj PRE 75/64/3329 5.18* 3.24 - 5.16 ml/(min*mmHg*L) Final   ??? VA PRE 07/29/2019 4.72  4.22 - 6.26 L Final   ??? IVC PRE 07/29/2019 3.29  2.59 - 4.24 L Final   ??? BHT POST 07/29/2019 10.92  sec Final       Allergies: No Known Allergies    Drug Interactions:   - Posaconazole increases concentrations of tacrolimus - monitor levels and adjust to keep in therapeutic range  - Exjade is a 3A4 inducer. Monitor tac levels and adjust to keep in therapeutic range      Current Medications:  Current Outpatient Medications   Medication Sig Dispense Refill   ??? acetaminophen (TYLENOL) 325 MG tablet Take 650 mg by mouth every six (6) hours as needed for pain.     ??? ALPRAZolam (XANAX) 0.25 MG tablet 0.25 mg daily as needed.      ??? deferasirox (EXJADE) 500 MG disintegrating tablet Take 1,000 mg by mouth.     ??? eltrombopag (PROMACTA) 50 MG tablet Take 150 mg by  mouth.     ??? folic acid (FOLVITE) 1 MG tablet Take 1 mg by mouth daily.      ??? levoFLOXacin (LEVAQUIN) 500 MG tablet Take 1 tablet (500 mg total) by mouth daily. 30 tablet 2   ??? levothyroxine (SYNTHROID) 100 MCG tablet Take 100 mcg by mouth daily.      ??? lithium (LITHOBID) 300 MG ER tablet Take 300 mg by mouth at bedtime.      ??? LORazepam (ATIVAN) 0.5 MG tablet Take 1 mg by mouth nightly as needed (sleep and nausea).     ??? ondansetron (ZOFRAN-ODT) 8 MG disintegrating tablet Take 8 mg by mouth every eight (8) hours as needed for nausea.     ??? posaconazole (NOXAFIL) 100 mg TbEC delayed released tablet Take 300 mg by mouth daily. 90 tablet 2   ??? sulfamethoxazole-trimethoprim (BACTRIM DS) 800-160 mg per tablet TAKE 1 TABLET BY MOUTH TWICE A DAY ON SATURDAY AND SUNDAY     ??? SUMAtriptan (IMITREX) 20 mg/actuation nasal spray USE 1 SPRAY IN THE NOSE DAILY AS NEEDED FOR MIGRAINE     ??? TRINTELLIX 20 mg tablet Take 20 mg by mouth daily.      ??? valACYclovir (VALTREX) 500 MG tablet Take 500 mg by mouth daily.      ??? zolpidem (AMBIEN) 10 mg tablet Take 10 mg by mouth nightly.      ??? levonorgestrel (MIRENA) 20 mcg/24 hours (5 yrs) 52 mg IUD 1 each by Intrauterine route.     ??? tacrolimus (PROGRAF) 1 MG capsule Take 1 capsule (1 mg total) by mouth two (2) times a day. 60 capsule 2     No current facility-administered medications for this visit.        Adherence: Missed 2 doses of tacrolimus recently when she fell asleep and forgot to take all her nightly meds.         Assessment: Susan Kane is a 59 y.o. 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 She seems to be tolerating the tacrolimus much better than cyclosporine. However despite being therapeutic we have not seen much benefit in her AA, although has only been ~5 weeks on tacrolimus. The recommendation may continue to be to proceed with BMT.    Plan:   AA:  - Continue tacrolimus 1 mg BID. Level pending at this time, should continue weekly monitoring. Goal range is 5-15 (ideally 8-12). [facility] comes from [facility] since I am owning dosing. I would like local labs to add on a Mg level with each CMP  - Continue Promacta 150 mg daily on empty stomach  - Continue Exjade 1000 mg daily. Should follow ferritin monthly. Dose can be adjusted 5-10 mg/kg every 3-6 months based on ferritin trends. She is planning on scheduling her eye appointment soon.   - BMT follow-up scheduled for 2/16 (labs on 2/10)    ID:  - Continue ppx with bactrim on weekends, valtrex, levaquin, and posaconaozle. As discussed before, avoiding off/on of posaconazole to avoid changes in tac level.       Supportive care:  - Continue regimen of daily zofran and nightly ativan.  - Asked patient to perhaps avoid Xanax to minimize number of benzos she is actively on    - Continue other chronic medications with local follow-up for prescribing.        F/u:  Future Appointments   Date Time Provider Department Center  09/03/2019 11:15 AM ONCBMT APP B [facility]   09/09/2019 10:45 AM Artelia Laroche, MD [facility]       I spent 40 minutes with Susan Kane on the phone.      Manfred Arch, PharmD, BCOP, CPP  Pager: [phone]

## 2019-08-19 NOTE — Unmapped (Signed)
Please return in 4 weeks for a follow up visit and labs.    Please call us if you experience:    1. Nausea or vomiting not controlled by nausea medicines  2. Fever of 100.5 F or higher, shaking chills, drenching night sweats.  3. Uncontrolled pain  4. Any rapidly enlarging lymph node or mass  5. Unintentional weight loss  6. Any other concerning symptom     MyChart Messages  For your safety and best care, please DO NOT use MyChart messages to report symptoms. (Symptoms should be reported by calling the nurse triage line). Please use MyChart for non-urgent matters such as general questions, non-urgent prescription refills, or non-urgent scheduling issues.     ?? Please do not use MyChart for URGENT messages, as messages are only checked during regular business hours.     ?? Please note that MyChart messages may be routed a central pool and one of your provider???s team members will get back to you.  - Expect up to 3 business days for response     If you have any other questions, please do not hesitate to contact us.    Nurse Navigator: Milinda Antis, RN    Nurse Practitioner: [name]    For health related questions Monday through Friday 8 AM??? 5 PM : please call the office at [phone] and ask to speak with a nurse.  For appointment changes call: Main Clinic [phone].  Toll free number is [phone].    On Nights, Weekends and Holidays:  Call [phone] and ask for the adult hematologist/oncologist on call.      [facility]  [address]  [place], [place] [address]  [email or web address]    Results for orders placed or performed during the hospital encounter of 07/29/19   Spirometry   Result Value Ref Range    FVC PRE 3.44 2.59 - 4.24 L    FEV1 PRE 2.50 2.03 - 3.33 L    FEV1/FVC PRE 72.57 67.09 - 91.03 %    FEV6 PRE 3.27 2.75 - 4.20 L    FEV1/FEV6 PRE 76.36 72.09 - 89.69 %    FEF25-75% PRE 1.86 1.22 - 3.54 L/s    ISOFEF25-75 PRE 1.86 L/s    FEF50% PRE 2.33 1.99 - 5.61 L/s    PEF PRE 5.86 4.85 - 8.51 L/s    FET100% Change 9.69 sec    FIVC PRE 3.27 2.59 - 4.24 L    FIF50% PRED 3.90 L/s    UYQ/IHK74 pre 59.77 %    PIF PRE 4.04 2.85 - 7.65 L/s    Vol extrap pre 0.05 L    DLCO PRE 11.94 (L) 16.55 - 26.91 ml/(min*mmHg)    DL Adj PRE 25.95 (L) 63.87 - 26.91 ml/(min*mmHg)    DLCO/VA POST 2.53 (L) 3.24 - 5.16 ml/(min*mmHg*L)    DL/VA Adj PRE 5.64 (L) 3.32 - 5.16 ml/(min*mmHg*L)    VA PRE 4.72 4.22 - 6.26 L    IVC PRE 3.29 2.59 - 4.24 L    BHT POST 10.92 sec   Diffusion Studies   Result Value Ref Range    FVC PRE 3.44 2.59 - 4.24 L    FEV1 PRE 2.50 2.03 - 3.33 L    FEV1/FVC PRE 72.57 67.09 - 91.03 %    FEV6 PRE 3.27 2.75 - 4.20 L    FEV1/FEV6 PRE 76.36 72.09 - 89.69 %    FEF25-75% PRE 1.86 1.22 - 3.54 L/s    RJJOAC16-60 PRE 1.86  L/s    FEF50% PRE 2.33 1.99 - 5.61 L/s    PEF PRE 5.86 4.85 - 8.51 L/s    FET100% Change 9.69 sec    FIVC PRE 3.27 2.59 - 4.24 L    FIF50% PRED 3.90 L/s    GNF/AOZ30 pre 59.77 %    PIF PRE 4.04 2.85 - 7.65 L/s    Vol extrap pre 0.05 L    DLCO PRE 11.94 (L) 16.55 - 26.91 ml/(min*mmHg)    DL Adj PRE 86.57 (L) 84.69 - 26.91 ml/(min*mmHg)    DLCO/VA POST 2.53 (L) 3.24 - 5.16 ml/(min*mmHg*L)    DL/VA Adj PRE 6.29 (L) 5.28 - 5.16 ml/(min*mmHg*L)    VA PRE 4.72 4.22 - 6.26 L    IVC PRE 3.29 2.59 - 4.24 L    BHT POST 10.92 sec

## 2019-08-19 NOTE — Telephone Encounter (Signed)
Mailbox was full could not reach patient regarding schedule

## 2019-08-20 ENCOUNTER — Telehealth: Payer: Self-pay | Admitting: Nurse Practitioner

## 2019-08-20 ENCOUNTER — Other Ambulatory Visit: Payer: Self-pay | Admitting: Psychiatry

## 2019-08-20 ENCOUNTER — Other Ambulatory Visit: Payer: Self-pay | Admitting: *Deleted

## 2019-08-20 ENCOUNTER — Inpatient Hospital Stay: Payer: BC Managed Care – PPO

## 2019-08-20 ENCOUNTER — Telehealth: Payer: Self-pay | Admitting: *Deleted

## 2019-08-20 DIAGNOSIS — D649 Anemia, unspecified: Secondary | ICD-10-CM

## 2019-08-20 DIAGNOSIS — C9110 Chronic lymphocytic leukemia of B-cell type not having achieved remission: Secondary | ICD-10-CM

## 2019-08-20 LAB — PREPARE RBC (CROSSMATCH)

## 2019-08-20 MED ORDER — SODIUM CHLORIDE 0.9% IV SOLUTION
250.0000 mL | Freq: Once | INTRAVENOUS | Status: DC
  Filled 2019-08-20: qty 250

## 2019-08-20 NOTE — Unmapped (Signed)
I spoke with patient Susan Kane to confirm appointments on the following date(s): appts scheduled for 2/23 with labs and Jasmine Pang

## 2019-08-20 NOTE — Telephone Encounter (Signed)
I talk with patient regarding schedule 1/29

## 2019-08-20 NOTE — Telephone Encounter (Signed)
Call from [facility] Pharmacist requesting to continue weekly Tacrolimus level and monthly ferritin. Also need Mg+ with each CMP. MD notified.

## 2019-08-20 NOTE — Telephone Encounter (Signed)
Notified patient that her lithium level is in therapeutic range now. Inquired about her nausea and tremor and she reports both are better. Faxed results to Dr. Clovis Pu [phone].

## 2019-08-20 NOTE — Progress Notes (Signed)
Orders entered for 1 unit blood on 08/22/19.

## 2019-08-21 LAB — TACROLIMUS LEVEL: Tacrolimus (FK506) - LabCorp: 9.2 ng/mL (ref 2.0–20.0)

## 2019-08-21 NOTE — Progress Notes (Signed)
Tried calling but there was no answer and VM is full. Will try again later.

## 2019-08-22 ENCOUNTER — Inpatient Hospital Stay: Payer: BC Managed Care – PPO

## 2019-08-22 ENCOUNTER — Other Ambulatory Visit: Payer: Self-pay | Admitting: *Deleted

## 2019-08-22 ENCOUNTER — Other Ambulatory Visit: Payer: Self-pay

## 2019-08-22 DIAGNOSIS — C9110 Chronic lymphocytic leukemia of B-cell type not having achieved remission: Secondary | ICD-10-CM

## 2019-08-22 DIAGNOSIS — E039 Hypothyroidism, unspecified: Secondary | ICD-10-CM | POA: Diagnosis not present

## 2019-08-22 DIAGNOSIS — R197 Diarrhea, unspecified: Secondary | ICD-10-CM | POA: Diagnosis not present

## 2019-08-22 DIAGNOSIS — R410 Disorientation, unspecified: Secondary | ICD-10-CM | POA: Diagnosis not present

## 2019-08-22 DIAGNOSIS — R5381 Other malaise: Secondary | ICD-10-CM | POA: Diagnosis not present

## 2019-08-22 DIAGNOSIS — D649 Anemia, unspecified: Secondary | ICD-10-CM

## 2019-08-22 DIAGNOSIS — Z95828 Presence of other vascular implants and grafts: Secondary | ICD-10-CM

## 2019-08-22 DIAGNOSIS — D61818 Other pancytopenia: Secondary | ICD-10-CM | POA: Diagnosis not present

## 2019-08-22 DIAGNOSIS — Z79899 Other long term (current) drug therapy: Secondary | ICD-10-CM | POA: Diagnosis not present

## 2019-08-22 DIAGNOSIS — R112 Nausea with vomiting, unspecified: Secondary | ICD-10-CM | POA: Diagnosis not present

## 2019-08-22 DIAGNOSIS — M25511 Pain in right shoulder: Secondary | ICD-10-CM | POA: Diagnosis not present

## 2019-08-22 DIAGNOSIS — D696 Thrombocytopenia, unspecified: Secondary | ICD-10-CM

## 2019-08-22 DIAGNOSIS — M25611 Stiffness of right shoulder, not elsewhere classified: Secondary | ICD-10-CM | POA: Diagnosis not present

## 2019-08-22 DIAGNOSIS — R251 Tremor, unspecified: Secondary | ICD-10-CM | POA: Diagnosis not present

## 2019-08-22 DIAGNOSIS — M1990 Unspecified osteoarthritis, unspecified site: Secondary | ICD-10-CM | POA: Diagnosis not present

## 2019-08-22 LAB — PREPARE RBC (CROSSMATCH)

## 2019-08-22 MED ORDER — SODIUM CHLORIDE 0.9% FLUSH
10.0000 mL | INTRAVENOUS | Status: AC | PRN
  Administered 2019-08-22: 17:00:00 10 mL
  Filled 2019-08-22: qty 10

## 2019-08-22 MED ORDER — HEPARIN SOD (PORK) LOCK FLUSH 100 UNIT/ML IV SOLN
250.0000 [IU] | INTRAVENOUS | Status: AC | PRN
  Administered 2019-08-22: 250 [IU]
  Filled 2019-08-22: qty 5

## 2019-08-22 MED ORDER — SODIUM CHLORIDE 0.9% IV SOLUTION
250.0000 mL | Freq: Once | INTRAVENOUS | Status: DC
  Filled 2019-08-22: qty 250

## 2019-08-22 MED ORDER — SODIUM CHLORIDE 0.9% FLUSH
10.0000 mL | INTRAVENOUS | Status: DC | PRN
  Administered 2019-08-22: 10 mL
  Filled 2019-08-22: qty 10

## 2019-08-22 NOTE — Telephone Encounter (Signed)
I believe she is still on this?

## 2019-08-22 NOTE — Patient Instructions (Signed)
Blood Transfusion, Adult A blood transfusion is a procedure in which you receive blood through an IV tube. You may need this procedure because of:  A bleeding disorder.  An illness.  An injury.  A surgery. The blood may come from someone else (a donor). You may also be able to donate blood for yourself. The blood given in a transfusion is made up of different types of cells. You may get:  Red blood cells. These carry oxygen to the cells in the body.  White blood cells. These help you fight infections.  Platelets. These help your blood to clot.  Plasma. This is the liquid part of your blood. It carries proteins and other substances through the body. If you have a clotting disorder, you may also get other types of blood products. Tell your doctor about:  Any blood disorders you have.  Any reactions you have had during a blood transfusion in the past.  Any allergies you have.  All medicines you are taking, including vitamins, herbs, eye drops, creams, and over-the-counter medicines.  Any surgeries you have had.  Any medical conditions you have. This includes any recent fever or cold symptoms.  Whether you are pregnant or may be pregnant. What are the risks? Generally, this is a safe procedure. However, problems may occur.  The most common problems include: ? A mild allergic reaction. This includes red, swollen areas of skin (hives) and itching. ? Fever or chills. This may be the body's response to new blood cells received. This may happen during or up to 4 hours after the transfusion.  More serious problems may include: ? Too much fluid in the lungs. This may cause breathing problems. ? A serious allergic reaction. This includes breathing trouble or swelling around the face and lips. ? Lung injury. This causes breathing trouble and low oxygen in the blood. This can happen within hours of the transfusion or days later. ? Too much iron. This can happen after getting many  blood transfusions over a period of time. ? An infection or virus passed through the blood. This is rare. Donated blood is carefully tested before it is given. ? Your body's defense system (immune system) trying to attack the new blood cells. This is rare. Symptoms may include fever, chills, nausea, low blood pressure, and low back or chest pain. ? Donated cells attacking healthy tissues. This is rare. What happens before the procedure? Medicines Ask your doctor about:  Changing or stopping your normal medicines. This is important.  Taking aspirin and ibuprofen. Do not take these medicines unless your doctor tells you to take them.  Taking over-the-counter medicines, vitamins, herbs, and supplements. General instructions  Follow instructions from your doctor about what you cannot eat or drink.  You will have a blood test to find out your blood type. The test also finds out what type of blood your body will accept and matches it to the donor type.  If you are going to have a planned surgery, you may be able to donate your own blood. This may be done in case you need a transfusion.  You will have your temperature, blood pressure, and pulse checked.  You may receive medicine to help prevent an allergic reaction. This may be done if you have had a reaction to a transfusion before. This medicine may be given to you by mouth or through an IV tube.  This procedure lasts about 1-4 hours. Plan for the time you need. What happens during the   procedure?   An IV tube will be put into one of your veins.  The bag of donated blood will be attached to your IV tube. Then, the blood will enter through your vein.  Your temperature, blood pressure, and pulse will be checked often. This is done to find early signs of a transfusion reaction.  Tell your nurse right away if you have any of these symptoms: ? Shortness of breath or trouble breathing. ? Chest or back pain. ? Fever or chills. ? Red,  swollen areas of skin or itching.  If you have any signs or symptoms of a reaction, your transfusion will be stopped. You may also be given medicine.  When the transfusion is finished, your IV tube will be taken out.  Pressure may be put on the IV site for a few minutes.  A bandage (dressing) will be put on the IV site. The procedure may vary among doctors and hospitals. What happens after the procedure?  You will be monitored until you leave the hospital or clinic. This includes checking your temperature, blood pressure, pulse, breathing rate, and blood oxygen level.  Your blood may be tested to see how you are responding to the transfusion.  You may be warmed with fluids or blankets. This is done to keep the temperature of your body normal.  If you have your procedure in an outpatient setting, you will be told whom to contact to report any reactions. Where to find more information To learn more, visit the American [employer]: redcross.org Summary  A blood transfusion is a procedure in which you are given blood through an IV tube.  The blood may come from someone else (a donor). You may also be able to donate blood for yourself.  The blood you are given is made up of different blood cells. You may receive red blood cells, platelets, plasma, or white blood cells.  Your temperature, blood pressure, and pulse will be checked often.  After the procedure, your blood may be tested to see how you are responding. This information is not intended to replace advice given to you by your health care provider. Make sure you discuss any questions you have with your health care provider. Document Revised: 01/02/2019 Document Reviewed: 01/02/2019 Elsevier Patient Education  2020 Elsevier Inc.  Coronavirus (COVID-19) Are you at risk?  Are you at risk for the Coronavirus (COVID-19)?  To be considered HIGH RISK for Coronavirus (COVID-19), you have to meet the following criteria:  . Traveled  to [place], [place], [place], [place] or [place]; or in the [place] to [place], [place], [place], or [place]; and have fever, cough, and shortness of breath within the last 2 weeks of travel OR . Been in close contact with a person diagnosed with COVID-19 within the last 2 weeks and have fever, cough, and shortness of breath . IF YOU DO NOT MEET THESE CRITERIA, YOU ARE CONSIDERED LOW RISK FOR COVID-19.  What to do if you are HIGH RISK for COVID-19?  . If you are having a medical emergency, call 911. . Seek medical care right away. Before you go to a doctor's office, urgent care or emergency department, call ahead and tell them about your recent travel, contact with someone diagnosed with COVID-19, and your symptoms. You should receive instructions from your physician's office regarding next steps of care.  . When you arrive at healthcare provider, tell the healthcare staff immediately you have returned from visiting [place], [place], [place], [place]   or [place]; or traveled in the [place] to [place], [place], [place], or [place]; in the last two weeks or you have been in close contact with a person diagnosed with COVID-19 in the last 2 weeks.   . Tell the health care staff about your symptoms: fever, cough and shortness of breath. . After you have been seen by a medical provider, you will be either: o Tested for (COVID-19) and discharged home on quarantine except to seek medical care if symptoms worsen, and asked to  - Stay home and avoid contact with others until you get your results (4-5 days)  - Avoid travel on public transportation if possible (such as bus, train, or airplane) or o Sent to the Emergency Department by EMS for evaluation, COVID-19 testing, and possible admission depending on your condition and test results.  What to do if you are LOW RISK for COVID-19?  Reduce your risk of any infection by using the same precautions used for avoiding the common cold or  flu:  . Wash your hands often with soap and warm water for at least 20 seconds.  If soap and water are not readily available, use an alcohol-based hand sanitizer with at least 60% alcohol.  . If coughing or sneezing, cover your mouth and nose by coughing or sneezing into the elbow areas of your shirt or coat, into a tissue or into your sleeve (not your hands). . Avoid shaking hands with others and consider head nods or verbal greetings only. . Avoid touching your eyes, nose, or mouth with unwashed hands.  . Avoid close contact with people who are sick. . Avoid places or events with large numbers of people in one location, like concerts or sporting events. . Carefully consider travel plans you have or are making. . If you are planning any travel outside or inside the [place], visit the CDC's Travelers' Health webpage for the latest health notices. . If you have some symptoms but not all symptoms, continue to monitor at home and seek medical attention if your symptoms worsen. . If you are having a medical emergency, call 911.   ADDITIONAL HEALTHCARE OPTIONS FOR PATIENTS  [facility] / e-Visit: [email or web address]         [facility]: [phone]  [facility]: [phone]                   [facility]: [phone]  

## 2019-08-22 NOTE — Patient Instructions (Signed)
PICC Home Care Guide  A peripherally inserted central catheter (PICC) is a form of IV access that allows medicines and IV fluids to be quickly distributed throughout the body. The PICC is a long, thin, flexible tube (catheter) that is inserted into a vein in the upper arm. The catheter ends in a large vein in the chest (superior vena cava, or SVC). After the PICC is inserted, a chest X-ray may be done to make sure that it is in the correct place. A PICC may be placed for different reasons, such as:  To give medicines and liquid nutrition.  To give IV fluids and blood products.  If there is trouble placing a peripheral intravenous (PIV) catheter. If taken care of properly, a PICC can remain in place for several months. Having a PICC can also allow a person to go home from the hospital sooner. Medicine and PICC care can be managed at home by a family member, caregiver, or home health care team. What are the risks? Generally, having a PICC is safe. However, problems may occur, including:  A blood clot (thrombus) forming in or at the tip of the PICC.  A blood clot forming in a vein (deep vein thrombosis) or traveling to the lung (pulmonary embolism).  Inflammation of the vein (phlebitis) in which the PICC is placed.  Infection. Central line associated blood stream infection (CLABSI) is a serious infection that often requires hospitalization.  PICC movement (malposition). The PICC tip may move from its original position due to excessive physical activity, forceful coughing, sneezing, or vomiting.  A break or cut in the PICC. It is important not to use scissors near the PICC.  Nerve or tendon irritation or injury during PICC insertion. How to take care of your PICC Preventing problems  You and any caregivers should wash your hands often with soap. Wash hands: ? Before touching the PICC line or the infusion device. ? Before changing a bandage (dressing).  Flush the PICC as told by your  health care provider. Let your health care provider know right away if the PICC is hard to flush or does not flush. Do not use force to flush the PICC.  Do not use a syringe that is less than 10 mL to flush the PICC.  Avoid blood pressure checks on the arm in which the PICC is placed.  Never pull or tug on the PICC.  Do not take the PICC out yourself. Only a trained clinical professional should remove the PICC.  Use clean and sterile supplies only. Keep the supplies in a dry place. Do not reuse needles, syringes, or any other supplies. Doing that can lead to infection.  Keep pets and children away from your PICC line.  Check the PICC insertion site every day for signs of infection. Check for: ? Leakage. ? Redness, swelling, or pain. ? Fluid or blood. ? Warmth. ? Pus or a bad smell. PICC dressing care  Keep your PICC bandage (dressing) clean and dry to prevent infection.  Do not take baths, swim, or use a hot tub until your health care provider approves. Ask your health care provider if you can take showers. You may only be allowed to take sponge baths for bathing. When you are allowed to shower: ? Ask your health care provider to teach you how to wrap the PICC line. ? Cover the PICC line with clear plastic wrap and tape to keep it dry while showering.  Follow instructions from your health care provider   about how to take care of your insertion site and dressing. Make sure you: ? Wash your hands with soap and water before you change your bandage (dressing). If soap and water are not available, use hand sanitizer. ? Change your dressing as told by your health care provider. ? Leave stitches (sutures), skin glue, or adhesive strips in place. These skin closures may need to stay in place for 2 weeks or longer. If adhesive strip edges start to loosen and curl up, you may trim the loose edges. Do not remove adhesive strips completely unless your health care provider tells you to do  that.  Change your PICC dressing if it becomes loose or wet. General instructions   Carry your PICC identification card or wear a medical alert bracelet at all times.  Keep the tube clamped at all times, unless it is being used.  Carry a smooth-edge clamp with you at all times to place on the tube if it breaks.  Do not use scissors or sharp objects near the tube.  You may bend your arm and move it freely. If your PICC is near or at the bend of your elbow, avoid activity with repeated motion at the elbow.  Avoid lifting heavy objects as told by your health care provider.  Keep all follow-up visits as told by your health care provider. This is important. Disposal of supplies  Throw away any syringes in a disposal container that is meant for sharp items (sharps container). You can buy a sharps container from a pharmacy, or you can make one by using an empty hard plastic bottle with a cover.  Place any used dressings or infusion bags into a plastic bag. Throw that bag in the trash. Contact a health care provider if:  You have pain in your arm, ear, face, or teeth.  You have a fever or chills.  You have redness, swelling, or pain around the insertion site.  You have fluid or blood coming from the insertion site.  Your insertion site feels warm to the touch.  You have pus or a bad smell coming from the insertion site.  Your skin feels hard and raised around the insertion site. Get help right away if:  Your PICC is accidentally pulled all the way out. If this happens, cover the insertion site with a bandage or gauze dressing. Do not throw the PICC away. Your health care provider will need to check it.  Your PICC was tugged or pulled and has partially come out. Do not  push the PICC back in.  You cannot flush the PICC, it is hard to flush, or the PICC leaks around the insertion site when it is flushed.  You hear a "flushing" sound when the PICC is flushed.  You feel your  heart racing or skipping beats.  There is a hole or tear in the PICC.  You have swelling in the arm in which the PICC was inserted.  You have a red streak going up your arm from where the PICC was inserted. Summary  A peripherally inserted central catheter (PICC) is a long, thin, flexible tube (catheter) that is inserted into a vein in the upper arm.  The PICC is inserted using a sterile technique by a specially trained nurse or physician. Only a trained clinical professional should remove it.  Keep your PICC identification card with you at all times.  Avoid blood pressure checks on the arm in which the PICC is placed.  If cared for   properly, a PICC can remain in place for several months. Having a PICC can also allow a person to go home from the hospital sooner. This information is not intended to replace advice given to you by your health care provider. Make sure you discuss any questions you have with your health care provider. Document Revised: 06/22/2017 Document Reviewed: 08/12/2016 Elsevier Patient Education  2020 [employer]  

## 2019-08-23 LAB — TYPE AND SCREEN
ABO/RH(D): O POS
Antibody Screen: NEGATIVE
Unit division: 0

## 2019-08-23 LAB — BPAM RBC
Blood Product Expiration Date: 202102262359
ISSUE DATE / TIME: 202101291419
Unit Type and Rh: 5100

## 2019-08-23 NOTE — Unmapped (Signed)
Recent background: On 12/15, Susan Kane was switched from cyclosporine to tacrolimus. This was mainly due to the limitations of nausea/vomiting on her levels and quality of life. If she were to get a transplant she would need to tolerate immunosuppression therapy, and so therefore the decision was made to switch her to tacrolimus.     She was started on 1 mg BID on 12/15. This dose was determined based off a target dose of 0.03 mg/kg BID with a 50% empiric dose reduction for the posaconazole interaction. She continues on 1 mg BID with no dose adjustments needed yet.    Patient's tacrolimus level from 1/26 came back 9.2 ng/mL (goal 5-15). She should continue on tac 1 mg BID.  Her SCr continues to be elevated and fluctuating. Her LFTs are WNL. CBC continues to shoe low counts (ANC 0.2, PLT 40, Hgb 7.7). Of note, she is currently on Exjade 1000 mg daily and Promacta 150 mg daily.      She should remain on ppx with posaconazole indefinitely to avoid stopping/starting which impacts tacrolimus metabolism and levels. She should continue other ppx of levofloxacin while ANC < 0.5, and valtrex and Bactrim indefinitely. Should continue to get weekly tacrolimus troughs locally. I will continue to follow.       Manfred Arch, PharmD, BCOP, CPP  Pager: [phone]

## 2019-08-25 NOTE — Progress Notes (Signed)
Pt. States she is tolerating the Lithium well and everything is going great.

## 2019-08-26 ENCOUNTER — Other Ambulatory Visit: Payer: Self-pay | Admitting: *Deleted

## 2019-08-26 ENCOUNTER — Inpatient Hospital Stay: Payer: BC Managed Care – PPO

## 2019-08-26 ENCOUNTER — Inpatient Hospital Stay: Payer: BC Managed Care – PPO | Attending: Oncology | Admitting: Nurse Practitioner

## 2019-08-26 ENCOUNTER — Other Ambulatory Visit: Payer: Self-pay

## 2019-08-26 ENCOUNTER — Encounter: Payer: Self-pay | Admitting: Nurse Practitioner

## 2019-08-26 VITALS — BP 114/60 | HR 94 | Temp 98.0°F | Resp 16 | Ht 66.0 in | Wt 120.0 lb

## 2019-08-26 DIAGNOSIS — D801 Nonfamilial hypogammaglobulinemia: Secondary | ICD-10-CM | POA: Insufficient documentation

## 2019-08-26 DIAGNOSIS — R112 Nausea with vomiting, unspecified: Secondary | ICD-10-CM | POA: Insufficient documentation

## 2019-08-26 DIAGNOSIS — Z79899 Other long term (current) drug therapy: Secondary | ICD-10-CM | POA: Insufficient documentation

## 2019-08-26 DIAGNOSIS — M1990 Unspecified osteoarthritis, unspecified site: Secondary | ICD-10-CM | POA: Diagnosis not present

## 2019-08-26 DIAGNOSIS — J189 Pneumonia, unspecified organism: Secondary | ICD-10-CM | POA: Diagnosis not present

## 2019-08-26 DIAGNOSIS — E039 Hypothyroidism, unspecified: Secondary | ICD-10-CM | POA: Diagnosis not present

## 2019-08-26 DIAGNOSIS — R251 Tremor, unspecified: Secondary | ICD-10-CM | POA: Insufficient documentation

## 2019-08-26 DIAGNOSIS — Z95828 Presence of other vascular implants and grafts: Secondary | ICD-10-CM

## 2019-08-26 DIAGNOSIS — D649 Anemia, unspecified: Secondary | ICD-10-CM | POA: Diagnosis not present

## 2019-08-26 DIAGNOSIS — D61818 Other pancytopenia: Secondary | ICD-10-CM | POA: Insufficient documentation

## 2019-08-26 DIAGNOSIS — C9110 Chronic lymphocytic leukemia of B-cell type not having achieved remission: Secondary | ICD-10-CM

## 2019-08-26 DIAGNOSIS — M25511 Pain in right shoulder: Secondary | ICD-10-CM | POA: Diagnosis not present

## 2019-08-26 DIAGNOSIS — R519 Headache, unspecified: Secondary | ICD-10-CM | POA: Insufficient documentation

## 2019-08-26 DIAGNOSIS — D696 Thrombocytopenia, unspecified: Secondary | ICD-10-CM

## 2019-08-26 LAB — CBC WITH DIFFERENTIAL (CANCER CENTER ONLY)
Abs Immature Granulocytes: 0.01 10*3/uL (ref 0.00–0.07)
Basophils Absolute: 0 10*3/uL (ref 0.0–0.1)
Basophils Relative: 0 %
Eosinophils Absolute: 0 10*3/uL (ref 0.0–0.5)
Eosinophils Relative: 1 %
HCT: 24.7 % — ABNORMAL LOW (ref 36.0–46.0)
Hemoglobin: 8.3 g/dL — ABNORMAL LOW (ref 12.0–15.0)
Immature Granulocytes: 1 %
Lymphocytes Relative: 88 %
Lymphs Abs: 1.7 10*3/uL (ref 0.7–4.0)
MCH: 29.2 pg (ref 26.0–34.0)
MCHC: 33.6 g/dL (ref 30.0–36.0)
MCV: 87 fL (ref 80.0–100.0)
Monocytes Absolute: 0.1 10*3/uL (ref 0.1–1.0)
Monocytes Relative: 7 %
Neutro Abs: 0.1 10*3/uL — CL (ref 1.7–7.7)
Neutrophils Relative %: 3 %
Platelet Count: 28 10*3/uL — ABNORMAL LOW (ref 150–400)
RBC: 2.84 MIL/uL — ABNORMAL LOW (ref 3.87–5.11)
RDW: 15.9 % — ABNORMAL HIGH (ref 11.5–15.5)
WBC Count: 1.9 10*3/uL — ABNORMAL LOW (ref 4.0–10.5)
nRBC: 0 % (ref 0.0–0.2)

## 2019-08-26 LAB — SAMPLE TO BLOOD BANK

## 2019-08-26 LAB — CMP (CANCER CENTER ONLY)
ALT: 15 U/L (ref 0–44)
AST: 12 U/L — ABNORMAL LOW (ref 15–41)
Albumin: 3.9 g/dL (ref 3.5–5.0)
Alkaline Phosphatase: 110 U/L (ref 38–126)
Anion gap: 7 (ref 5–15)
BUN: 17 mg/dL (ref 6–20)
CO2: 24 mmol/L (ref 22–32)
Calcium: 9.4 mg/dL (ref 8.9–10.3)
Chloride: 108 mmol/L (ref 98–111)
Creatinine: 1.33 mg/dL — ABNORMAL HIGH (ref 0.44–1.00)
GFR, Est AFR Am: 50 mL/min — ABNORMAL LOW (ref >60)
GFR, Estimated: 43 mL/min — ABNORMAL LOW (ref >60)
Glucose, Bld: 139 mg/dL — ABNORMAL HIGH (ref 70–99)
Potassium: 4.4 mmol/L (ref 3.5–5.1)
Sodium: 139 mmol/L (ref 135–145)
Total Bilirubin: 0.6 mg/dL (ref 0.3–1.2)
Total Protein: 5.6 g/dL — ABNORMAL LOW (ref 6.5–8.1)

## 2019-08-26 LAB — LITHIUM LEVEL: Lithium Lvl: 0.96 mmol/L (ref 0.60–1.20)

## 2019-08-26 LAB — MAGNESIUM: Magnesium: 1.7 mg/dL (ref 1.7–2.4)

## 2019-08-26 MED ORDER — SODIUM CHLORIDE 0.9% FLUSH
10.0000 mL | INTRAVENOUS | Status: DC | PRN
  Administered 2019-08-26: 10 mL
  Filled 2019-08-26: qty 10

## 2019-08-26 MED ORDER — HEPARIN SOD (PORK) LOCK FLUSH 100 UNIT/ML IV SOLN
500.0000 [IU] | Freq: Once | INTRAVENOUS | Status: AC | PRN
  Administered 2019-08-26: 250 [IU]
  Filled 2019-08-26: qty 5

## 2019-08-26 NOTE — Unmapped (Signed)
Bone Marrow Transplant/Cellular Therapy Outpatient Clinical Social Work Progress Note:   08/25/2019  TC with Ulis Rias, sister-in-law [phone]). SW educated Ulis Rias on primary caregiver role and responsibilities; Ulis Rias reports willingness and availability to be present in [place] with patient for post-transplant recovery as patient's husband will be with her for pre-transplant and transplant. Ulis Rias is aware of caregiver responsibilities and states in conversation with patient, she is planning to be patient's primary caregiver for ~2 weeks however she can stay role longer if needed. Ulis Rias discussed travel from [place] to [place]; Ulis Rias reports she is soon to have her 2nd COVID-19 vaccine and will be vaccinated by the time patient needs her caregiving assistance. Ulis Rias is retired. She verbalizes understanding role and denies any barriers to being able to fulfill this role.      08/22/2019  TC with Wilber Oliphant, son [phone]). SW educated [employer] on primary caregiver role and responsibilities; Amandine Covino reports willingness and availability to be present in [place] with patient for post-transplant recovery as patient's husband will be with her for pre-transplant and transplant. Rasheedah Reis is aware of caregiver responsibilities and states in conversation with patient, he is planning to be patient's primary caregiver for ~2 weeks however he can stay role longer if needed. Leandrea Ackley reports he is working part-time and has flexibility to take as much time off to fulfill role. He verbalizes understanding role and denies any barriers to being able to fulfill this role.        TC with Tristina Sahagian, daughter [phone]). SW educated [employer] on primary caregiver role and responsibilities; Darlin Drop reports willingness and availability to be present in [place] with patient for post-transplant recovery as patient's husband will be with her for pre-transplant and transplant. Kesleigh Morson is aware of caregiver responsibilities and states in conversation with patient, she is planning to be patient's primary caregiver for ~2 weeks however she can stay role longer if needed. SW and [employer] discussed travel from [place] to [facility] and covid testing. SW and [employer] discussed [employer], and she reports her employer is willing to be as flexible as needed. She verbalizes understanding role and denies any barriers to being able to fulfill this role.      Nadara Eaton, LCSW   Outpatient Social Worker   [facility]   Phone: [phone]  Pager: [phone]    August 26, 2019 4:56 PM

## 2019-08-26 NOTE — Progress Notes (Addendum)
[facility] OFFICE PROGRESS NOTE   Diagnosis: CLL, aplastic anemia  INTERVAL HISTORY:   Meagan Walsh returns as scheduled.  She was transfused a unit of blood on 08/22/2019.  She notes some improvement in her energy level.  She continues to have vaginal bleeding.  No other bleeding.  No fever.  No cough or shortness of breath.  Nausea continues to be improved.  She vomits periodically.  She continues to have hand tremors.  She continues to have right shoulder pain.  Objective:  Vital signs in last 24 hours:  Blood pressure 114/60, pulse 94, temperature 98 F (36.7 C), temperature source Temporal, resp. rate 16, height '5\' 6"'  (1.676 m), weight 120 lb (54.4 kg), SpO2 100 %.    HEENT: Mild hypertrophy of the upper gums.  No bleeding.  No thrush. GI: Abdomen soft and nontender.  No hepatosplenomegaly. Vascular: No leg edema. Neuro: Alert and oriented.  PICC site bandaged.  Lab Results:  Lab Results  Component Value Date   WBC 1.9 (L) 08/26/2019   HGB 8.3 (L) 08/26/2019   HCT 24.7 (L) 08/26/2019   MCV 87.0 08/26/2019   PLT 28 (L) 08/26/2019   NEUTROABS 0.1 (LL) 08/26/2019    Imaging:  No results found.  Medications: I have reviewed the patient's current medications.  Assessment/Plan: 1.  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 Mild splenomegaly.  Ultrasound-guided biopsy of the left inguinal lymph node 09/18/2017, slightly "purulent "fluid aspirated, core biopsy is consistent with an atypical lymphoid proliferation-extensive necrosis with surrounding epithelioid histiocytes, limited intact lymphoid tissue involved with CLL  Incisional biopsy of a necrotic/purulent left inguinal  lymph node on 10/01/2017-extensive necrosis with granulomatous inflammation, small amount of viable lymphoid tissue involved with CLL, AFB and fungal stains negative  Peripheral blood FISH analysis 02/05/2018-deletion 13q14, no evidence of p53 (17p13) deletion, no evidence of 11q22deletion  Bone marrow biopsy 02/26/2018-hypercellular marrow with extensive involvement by CLL, lymphocytes represent85% of all cells  Ibrutinib initiated 04/03/2018  Ibrutinib placed on hold 04/11/2018 due to onset of arthralgias  Ibrutinib resumed 04/16/2018, discontinued 04/25/2018 secondary to severe arthralgias/arthritis  Ibrutinib resumed at a dose of 140 mg daily 05/03/2018  Ibrutinib dose adjusted to 140 mg alternating with 272m 06/25/2018  Ibrutinib discontinued 07/03/2018 secondary to severe arthralgias  Acalabrutinib 08/16/2018, discontinued 11/15/2018 secondary to persistent severe transfusion dependent anemia and neutropenia/thrombocytopenia, last dose 11/14/2018  Bone marrow biopsy 11/21/2018-decreased cellularity, involvement by CLL, decreased erythroid and granulocytic precursors, decreased megakaryocytes  Cycle 1 rituximab 12/06/2018  Bone marrow biopsy 12/24/2018 at [facility]-hypocellular bone marrow (10%) involved by CLL, representing 50% of marrow cellularity; markedly decreased trilineage hematopoiesis including essentially absent erythropoiesis  Bone marrow biopsy 03/06/2027 [facility]-hypocellular bone marrow (20%) with marked reduction of maturing hematopoietic elements; numerous lymphoid aggregates consistent with involvement by CLL, representing approximately 50% of marrow cellularity by flow cytometry  [facility] followed by prednisone taper  Eltrombopag beginning 07/05/2019  Cyclosporine discontinued 07/08/2019  Tacrolimus 07/10/2019  2.Hypothyroidism 3.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 Augmentin prescribed. 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 Pancytopenia secondary to CLL and a hypocellular bone marrow  G-CSF and Nplate started 1/61/0960, G-CSF changed to daily beginning 12/17/2018; G-CSF discontinued 12/31/2018, last Nplate 01/15/2019  Began prednisone 60 mg daily 01/11/19, tolerating moderately well except jitteriness, irritability, and difficulty sleeping. Tapered to 40 mg daily x1 week starting 01/24/19, reduce by 10 mg each week until discontinued. Prednisone discontinued 02/20/2019.  Promacta started 01/20/2019, dose increased to 100 mg daily 02/04/2019; dose increased to 150 mg daily 02/21/2019  IVIG daily for 2 days beginning 02/25/2019   9. 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 No lymphadenopathy or splenomegaly. Exjade beginning 06/28/2019. 13. Elevated creatinine-cyclosporine toxicity?  Persistent 14.Vaginal bleeding beginning 07/13/2019-Mirena device removed 15.  Elevated lithium level 08/08/2019, lithium toxicity?-Lithium held 08/08/2019-08/12/2019, then resumed at a lower dose  Disposition: Meagan Walsh appears unchanged.  She continues to have severe pancytopenia.  She was transfused a unit of blood 08/22/2019.  No transfusion indicated today.  She will return for repeat labs on 09/01/2019.  She understands to contact the office with fever, chills, other signs of infection, bleeding, signs/symptoms  suggestive of progressive anemia.  She has an appointment with the transplant service at [facility] on 09/03/2019.  She will return for lab and follow-up here on 09/08/2019.  She will contact the office in the interim as outlined above or with any other problems.  Patient seen with Dr. Benay Spice.    Ned Card ANP/GNP-BC   08/26/2019  11:10 AM  This was a shared visit with Ned Card.  Ms. Papania appears stable.  She has persistent severe pancytopenia.  She will not receive a transfusion today.  We will follow-up on the tacrolimus and lithium levels from today.  Meagan Manson, MD

## 2019-08-26 NOTE — Progress Notes (Signed)
CRITICAL VALUE STICKER  CRITICAL VALUE:ANC 0.1  RECEIVER (on-site recipient of call): Manuela Schwartz, [place] NOTIFIED: 08/26/19 @ 1101  MESSENGER (representative from lab): Hillary  MD NOTIFIED: Ned Card, NP   TIME OF NOTIFICATION:1102  RESPONSE: Being seen currently

## 2019-08-28 NOTE — Unmapped (Signed)
Note made in error

## 2019-08-28 NOTE — Unmapped (Signed)
[facility] Refill Coordination Note    Specialty Medication(s) to be Shipped:   Hematology/Oncology: Tacrolimus 1mg     Other medication(s) to be shipped: n/a     Susan Kane, DOB: 12-May-1961  Phone: [phone] (home)       All above HIPAA information was verified with patient.     Completed refill call assessment today to schedule patient's medication shipment from the [facility] [phone]).       Specialty medication(s) and dose(s) confirmed: Regimen is correct and unchanged.   Changes to medications: Lithium dose was decreased  Changes to insurance: No  Questions for the pharmacist: No    Confirmed patient received Welcome Packet with first shipment. The patient will receive a drug information handout for each medication shipped and additional FDA Medication Guides as required.       DISEASE/MEDICATION-SPECIFIC INFORMATION        N/A    SPECIALTY MEDICATION ADHERENCE     Medication Adherence    Patient reported X missed doses in the last month: 0  Specialty Medication: Tacrolimus 1mg   Informant: patient  Reliability of informant: fairly reliable  Reasons for non-adherence: no problems identified                Tacrolimus 1 mg: 9 days of medicine on hand         SHIPPING     Shipping address confirmed in Epic.     Delivery Scheduled: Yes, Expected medication delivery date: 09/02/19.     Medication will be delivered via UPS to the prescription address in Epic WAM.    Desani Sprung Vangie Bicker   [facility] Specialty Pharmacist

## 2019-08-28 NOTE — Unmapped (Signed)
Bone Marrow Transplant/Cellular Therapy Outpatient Clinical Social Work Progress Note:   08/28/2019  TC with Susan Kane [phone]). SW educated Susan Kane on backup caregiver role and responsibilities; Susan Kane reports willingness and availability to step into primary caregiver role as needed and to be present in [place] with patient for post-transplant recovery as patient's husband will be with her for pre-transplant and transplant. Susan Kane is aware of caregiver responsibilities and states ability to be relieve primary caregiver role for as long as patient needs. She verbalizes understanding role and denies any barriers to being able to fulfill this role.     SW sent inbasket message to BMT team, providing update on caregiver plan. SW to work with pt to set up calendar of caregivers so all parties know when they are responsible for caregiver duties, to avoid gap or confusion around her care. All parties report flexibility and able to be in role for as long as patient needs.   Primary caregivers:   Susan Kane, son   Susan Kane, son  Susan Kane, daughter   Susan Kane, sister-in-law    Backup caregivers:  Susan Kane, husband   Susan Kane, friend     08/27/2019  TC w patient [phone]) to get contact information for additional caregiver; patient provided contact information for son Susan Kane and friend Susan Kane; plan: SW and patient to be in contact and set up caregiver schedule after 2/16 appointment w BMT Dr     Susan Kane with Susan Kane, son [phone]). SW educated [employer] on primary caregiver role and responsibilities; Susan Kane [place] reports willingness and availability to be present in [place] with patient for post-transplant recovery as patient's husband will be with her for pre-transplant and transplant. Susan Kane is aware of caregiver responsibilities and states ability to be in primary caregiver role for as long as patient needs. Susan Kane reports he is working part-time and full flexibility with job. He verbalizes understanding role and denies any barriers to being able to fulfill this role.     Nadara Eaton, LCSW   Outpatient Social Worker   [facility]   Phone: [phone]  Pager: [phone]    August 28, 2019 2:18 PM

## 2019-08-29 ENCOUNTER — Encounter: Payer: Self-pay | Admitting: *Deleted

## 2019-08-29 ENCOUNTER — Telehealth: Payer: Self-pay | Admitting: Oncology

## 2019-08-29 LAB — TACROLIMUS LEVEL: Tacrolimus (FK506) - LabCorp: 5.4 ng/mL (ref 2.0–20.0)

## 2019-08-29 NOTE — Unmapped (Signed)
Recent background: On 12/15, Susan Kane was switched from cyclosporine to tacrolimus. This was mainly due to the limitations of nausea/vomiting on her levels and quality of life. If she were to get a transplant she would need to tolerate immunosuppression therapy, and so therefore the decision was made to switch her to tacrolimus.     She was started on 1 mg BID on 12/15. This dose was determined based off a target dose of 0.03 mg/kg BID with a 50% empiric dose reduction for the posaconazole interaction. She continues on 1 mg BID with no dose adjustments needed yet.    Patient's tacrolimus level from 2/2 came back 5.4 ng/mL (goal 5-15). This was likely a couple hours late so true trough likely higher. She should continue on tac 1 mg BID.  Her SCr continues to be elevated and fluctuating. Her LFTs are WNL. CBC continues to show low counts (ANC 0.1, PLT 28, Hgb 8.3). Of note, she is currently on Exjade 1000 mg daily and Promacta 150 mg daily.      She should remain on ppx with posaconazole indefinitely to avoid stopping/starting which impacts tacrolimus metabolism and levels. She should continue other ppx of levofloxacin while ANC < 0.5, and valtrex and Bactrim indefinitely. Should continue to get weekly tacrolimus troughs locally. I will continue to follow.       Manfred Arch, PharmD, BCOP, CPP  Pager: [phone]

## 2019-08-29 NOTE — Progress Notes (Signed)
Tacrolimus level just resulted this morning. Faxed level and all labs from 08/26/19 to [place] [phone] att: Joellen Jersey, PharmD

## 2019-08-29 NOTE — Telephone Encounter (Signed)
Scheduled per los. Called and spoke with patient. Confirmed appts  

## 2019-09-01 ENCOUNTER — Other Ambulatory Visit: Payer: Self-pay | Admitting: Emergency Medicine

## 2019-09-01 ENCOUNTER — Inpatient Hospital Stay: Payer: BC Managed Care – PPO

## 2019-09-01 ENCOUNTER — Other Ambulatory Visit: Payer: Self-pay

## 2019-09-01 ENCOUNTER — Inpatient Hospital Stay (HOSPITAL_BASED_OUTPATIENT_CLINIC_OR_DEPARTMENT_OTHER): Payer: BC Managed Care – PPO | Admitting: Medical

## 2019-09-01 DIAGNOSIS — Z95828 Presence of other vascular implants and grafts: Secondary | ICD-10-CM

## 2019-09-01 DIAGNOSIS — C9110 Chronic lymphocytic leukemia of B-cell type not having achieved remission: Secondary | ICD-10-CM | POA: Diagnosis not present

## 2019-09-01 DIAGNOSIS — D649 Anemia, unspecified: Secondary | ICD-10-CM

## 2019-09-01 DIAGNOSIS — R519 Headache, unspecified: Secondary | ICD-10-CM | POA: Diagnosis not present

## 2019-09-01 DIAGNOSIS — R251 Tremor, unspecified: Secondary | ICD-10-CM | POA: Diagnosis not present

## 2019-09-01 DIAGNOSIS — J189 Pneumonia, unspecified organism: Secondary | ICD-10-CM | POA: Diagnosis not present

## 2019-09-01 DIAGNOSIS — D696 Thrombocytopenia, unspecified: Secondary | ICD-10-CM

## 2019-09-01 DIAGNOSIS — F331 Major depressive disorder, recurrent, moderate: Secondary | ICD-10-CM

## 2019-09-01 DIAGNOSIS — Z79899 Other long term (current) drug therapy: Secondary | ICD-10-CM | POA: Diagnosis not present

## 2019-09-01 DIAGNOSIS — M25511 Pain in right shoulder: Secondary | ICD-10-CM | POA: Diagnosis not present

## 2019-09-01 DIAGNOSIS — R112 Nausea with vomiting, unspecified: Secondary | ICD-10-CM | POA: Diagnosis not present

## 2019-09-01 DIAGNOSIS — D61818 Other pancytopenia: Secondary | ICD-10-CM

## 2019-09-01 DIAGNOSIS — R110 Nausea: Secondary | ICD-10-CM | POA: Diagnosis not present

## 2019-09-01 DIAGNOSIS — E039 Hypothyroidism, unspecified: Secondary | ICD-10-CM | POA: Diagnosis not present

## 2019-09-01 DIAGNOSIS — D801 Nonfamilial hypogammaglobulinemia: Secondary | ICD-10-CM | POA: Diagnosis not present

## 2019-09-01 DIAGNOSIS — M1990 Unspecified osteoarthritis, unspecified site: Secondary | ICD-10-CM | POA: Diagnosis not present

## 2019-09-01 LAB — CMP (CANCER CENTER ONLY)
ALT: 11 U/L (ref 0–44)
AST: 10 U/L — ABNORMAL LOW (ref 15–41)
Albumin: 3.9 g/dL (ref 3.5–5.0)
Alkaline Phosphatase: 114 U/L (ref 38–126)
Anion gap: 9 (ref 5–15)
BUN: 17 mg/dL (ref 6–20)
CO2: 23 mmol/L (ref 22–32)
Calcium: 9.6 mg/dL (ref 8.9–10.3)
Chloride: 107 mmol/L (ref 98–111)
Creatinine: 1.63 mg/dL — ABNORMAL HIGH (ref 0.44–1.00)
GFR, Est AFR Am: 39 mL/min — ABNORMAL LOW (ref >60)
GFR, Estimated: 34 mL/min — ABNORMAL LOW (ref >60)
Glucose, Bld: 131 mg/dL — ABNORMAL HIGH (ref 70–99)
Potassium: 3.8 mmol/L (ref 3.5–5.1)
Sodium: 139 mmol/L (ref 135–145)
Total Bilirubin: 0.4 mg/dL (ref 0.3–1.2)
Total Protein: 5.7 g/dL — ABNORMAL LOW (ref 6.5–8.1)

## 2019-09-01 LAB — CBC WITH DIFFERENTIAL (CANCER CENTER ONLY)
Abs Immature Granulocytes: 0 10*3/uL (ref 0.00–0.07)
Basophils Absolute: 0 10*3/uL (ref 0.0–0.1)
Basophils Relative: 0 %
Eosinophils Absolute: 0 10*3/uL (ref 0.0–0.5)
Eosinophils Relative: 1 %
HCT: 21.3 % — ABNORMAL LOW (ref 36.0–46.0)
Hemoglobin: 7.2 g/dL — ABNORMAL LOW (ref 12.0–15.0)
Immature Granulocytes: 0 %
Lymphocytes Relative: 86 %
Lymphs Abs: 2.3 10*3/uL (ref 0.7–4.0)
MCH: 29.5 pg (ref 26.0–34.0)
MCHC: 33.8 g/dL (ref 30.0–36.0)
MCV: 87.3 fL (ref 80.0–100.0)
Monocytes Absolute: 0.2 10*3/uL (ref 0.1–1.0)
Monocytes Relative: 7 %
Neutro Abs: 0.2 10*3/uL — CL (ref 1.7–7.7)
Neutrophils Relative %: 6 %
Platelet Count: 33 10*3/uL — ABNORMAL LOW (ref 150–400)
RBC: 2.44 MIL/uL — ABNORMAL LOW (ref 3.87–5.11)
RDW: 16 % — ABNORMAL HIGH (ref 11.5–15.5)
WBC Count: 2.6 10*3/uL — ABNORMAL LOW (ref 4.0–10.5)
nRBC: 0 % (ref 0.0–0.2)

## 2019-09-01 LAB — SAMPLE TO BLOOD BANK

## 2019-09-01 LAB — PREPARE RBC (CROSSMATCH)

## 2019-09-01 LAB — MAGNESIUM: Magnesium: 1.8 mg/dL (ref 1.7–2.4)

## 2019-09-01 LAB — LITHIUM LEVEL: Lithium Lvl: 1.14 mmol/L (ref 0.60–1.20)

## 2019-09-01 MED ORDER — SODIUM CHLORIDE 0.9% FLUSH
10.0000 mL | INTRAVENOUS | Status: DC | PRN
  Administered 2019-09-01: 10 mL
  Filled 2019-09-01: qty 10

## 2019-09-01 MED ORDER — SODIUM CHLORIDE 0.9% FLUSH
3.0000 mL | INTRAVENOUS | Status: AC | PRN
  Administered 2019-09-01: 3 mL
  Filled 2019-09-01: qty 10

## 2019-09-01 MED ORDER — HEPARIN SOD (PORK) LOCK FLUSH 100 UNIT/ML IV SOLN
500.0000 [IU] | Freq: Once | INTRAVENOUS | Status: DC | PRN
  Filled 2019-09-01: qty 5

## 2019-09-01 MED ORDER — HEPARIN SOD (PORK) LOCK FLUSH 100 UNIT/ML IV SOLN
250.0000 [IU] | INTRAVENOUS | Status: AC | PRN
  Administered 2019-09-01: 250 [IU]
  Filled 2019-09-01: qty 5

## 2019-09-01 MED ORDER — SODIUM CHLORIDE 0.9% IV SOLUTION
250.0000 mL | Freq: Once | INTRAVENOUS | Status: AC
  Administered 2019-09-01: 250 mL via INTRAVENOUS
  Filled 2019-09-01: qty 250

## 2019-09-01 MED FILL — TACROLIMUS 1 MG CAPSULE: 30 days supply | Qty: 60 | Fill #2 | Status: AC

## 2019-09-01 NOTE — Progress Notes (Signed)
[facility]pt[facility] Progress Note   [name] [id number] 04-20-1961 59 y.o.  Meagan Walsh [facility] is managed by Dr. Dominica Severin B. Sherrill  Actively treated with chemotherapy/immunotherapy/hormonal therapy: yes  Current therapy: Tacrolimus, Promacta    Next scheduled appointment with provider: 09/08/2019  Assessment: Plan:    Nausea without vomiting  Pancytopenia ([place])  CLL (chronic lymphocytic leukemia) ([place])   Nausea: The etiology of her nausea is not understood.  This was discussed with Dr. Benay Spice.  I discussed with the patient that this could be related to her many medications.  I suggested that she might try yogurt given that she is on long-term antibiotics and that perhaps her nausea could be related to changes and her gastrointestinal flora.  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  The WBC returned stable with 2.6 with a platelet count stable at 33.  The patient is being seen at [facility] tomorrow for discussion regarding a possible bone marrow transplant  Please see After Visit Summary for patient specific instructions.  Future Appointments  Date Time Provider [place]  09/08/2019 10:30 AM [facility]FLUSH [facility] None  09/08/2019 10:45 AM [facility] [place] FLUSH [facility] None  09/08/2019 11:15 AM Owens Shark, NP [facility] None  09/08/2019 12:00 PM [facility] INFUSION [facility] None    No orders of the defined types were placed in this encounter.      Subjective:   Patient ID:  Meagan Walsh is a 59 y.o. (DOB 1961/05/14) female.  Chief Complaint: No chief complaint on file.   HPI Meagan Walsh Is a 59 y.o. female with a diagnosis of pancytopenia secondary to aplastic anemia and CLL.  She presents to the clinic today with fatigue, pallor, dizziness, anorexia, and ongoing  nausea and vomiting.  She is scheduled to be seen at the [facility] tomorrow for discussion regarding possible bone marrow transplant.  She continues under the care of Dr. Benay Spice and is currently treated with tacrolimus and Promacta.  She denies fevers, chills, or sweats.  The etiology of her ongoing nausea is poorly understood.  She is however on multiple longstanding medications including antibiotics.  A CBC returned today with a WBC of 2.6, hemoglobin 7.2, hematocrit 21.3, and platelet count of 33.  Medications: I have reviewed the patient's current medications.  Allergies: No Known Allergies  Past Medical History:  Diagnosis Date  . Anxiety   . Cancer ([place])    CLL  . Degenerative disk disease    Neck--chronic pain  . Depression   . Fibroids    Uterine  . 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 Hypothyroidism   . Lymphocytosis   . Mitral valve prolapse   . Mitral valve prolapse   . Thyroid disease    Hypothyroid    Past Surgical History:  Procedure Laterality Date  . BREAST BIOPSY Left 2000  . CESAREAN SECTION  1996  . MELANOMA EXCISION WITH SENTINEL LYMPH NODE BIOPSY Left 10/01/2017   Procedure: EXCISIONAL BIOPSY OF LEFT INGUINAL LYMPH NODE;  Surgeon: Greer Pickerel, MD;  Location: [facility];  Service: General;  Laterality: Left;  . REVISION OF ABDOMINAL SCAR    . TONSILLECTOMY     age 41    Family History  Problem Relation Age of Onset  . Breast cancer  Mother     Social History   Socioeconomic History  . Marital status: Married    Spouse name: Not on file  . Number of children: Not on file  . Years of education: Not on file  . Highest education level: Not on file  Occupational History  . Not on file  Tobacco Use  . Smoking status: Never Smoker  . Smokeless tobacco: Never Used  Substance and Sexual Activity  . Alcohol use: Yes    Comment: 2 glasses of wine 2 times per week   . Drug use: No  . Sexual activity: Not on file    Other Topics Concern  . Not on file  Social History Narrative   Pt lives in 2 story home with her husband   Has 3 adult children   Masters degree   Works at [employer] Strain:   . Difficulty of Paying Living Expenses: Not on file  Food Insecurity:   . Worried About Charity fundraiser in the Last Year: Not on file  . Ran Out of Food in the Last Year: Not on file  Transportation Needs:   . Lack of Transportation (Medical): Not on file  . Lack of Transportation (Non-Medical): Not on file  Physical Activity:   . Days of Exercise per Week: Not on file  . Minutes of Exercise per Session: Not on file  Stress:   . Feeling of Stress : Not on file  Social Connections:   . Frequency of Communication with Friends and Family: Not on file  . Frequency of Social Gatherings with Friends and Family: Not on file  . Attends Religious Services: Not on file  . Active Member of Clubs or Organizations: Not on file  . Attends Archivist Meetings: Not on file  . Marital Status: Not on file  Intimate Partner Violence:   . Fear of Current or Ex-Partner: Not on file  . Emotionally Abused: Not on file  . Physically Abused: Not on file  . Sexually Abused: Not on file    Past Medical History, Surgical history, Social history, and Family history were reviewed and updated as appropriate.   Please see review of systems for further details on the patient's review from today.   Review of Systems:  Review of Systems  Constitutional: Positive for appetite change and fatigue. Negative for chills, diaphoresis and fever.  HENT: Negative for trouble swallowing.   Respiratory: Negative for cough, choking, chest tightness, shortness of breath, wheezing and stridor.   Cardiovascular: Negative for chest pain and palpitations.  Gastrointestinal: Positive for nausea and vomiting. Negative for constipation and diarrhea.  Neurological: Positive for  dizziness.    Objective:   Physical Exam:  There were no vitals taken for this visit. ECOG: 1  Physical Exam Constitutional:      General: She is not in acute distress.    Appearance: She is not diaphoretic.  HENT:     Head: Normocephalic and atraumatic.  Eyes:     General: No scleral icterus.       Right eye: No discharge.        Left eye: No discharge.     Conjunctiva/sclera: Conjunctivae normal.  Cardiovascular:     Rate and Rhythm: Normal rate and regular rhythm.     Heart sounds: Normal heart sounds. No murmur. No friction rub. No gallop.   Pulmonary:     Effort: Pulmonary effort is normal.  No respiratory distress.     Breath sounds: Normal breath sounds. No stridor. No wheezing or rales.  Abdominal:     General: Bowel sounds are normal. There is no distension.     Palpations: Abdomen is soft. There is no mass.     Tenderness: There is no abdominal tenderness. There is no guarding or rebound.  Skin:    General: Skin is warm and dry.     Coloration: Skin is pale.  Neurological:     Mental Status: She is alert.     Coordination: Coordination normal.     Gait: Gait abnormal (The patient's gait was slightly unsteady.).  Psychiatric:        Mood and Affect: Mood normal.        Behavior: Behavior normal.        Thought Content: Thought content normal.        Judgment: Judgment normal.     Lab Review:     Component Value Date/Time   NA 139 09/01/2019 0851   K 3.8 09/01/2019 0851   CL 107 09/01/2019 0851   CO2 23 09/01/2019 0851   GLUCOSE 131 (H) 09/01/2019 0851   BUN 17 09/01/2019 0851   CREATININE 1.63 (H) 09/01/2019 0851   CALCIUM 9.6 09/01/2019 0851   PROT 5.7 (L) 09/01/2019 0851   ALBUMIN 3.9 09/01/2019 0851   AST 10 (L) 09/01/2019 0851   ALT 11 09/01/2019 0851   ALKPHOS 114 09/01/2019 0851   BILITOT 0.4 09/01/2019 0851   GFRNONAA 34 (L) 09/01/2019 0851   GFRAA 39 (L) 09/01/2019 0851       Component Value Date/Time   WBC 2.6 (L) 09/01/2019 0851    WBC 9.9 03/31/2019 0845   RBC 2.44 (L) 09/01/2019 0851   HGB 7.2 (L) 09/01/2019 0851   HGB 14.0 06/27/2016 1038   HCT 21.3 (L) 09/01/2019 0851   HCT 42.0 06/27/2016 1038   PLT 33 (L) 09/01/2019 0851   PLT 205 06/27/2016 1038   MCV 87.3 09/01/2019 0851   MCV 92.9 06/27/2016 1038   MCH 29.5 09/01/2019 0851   MCHC 33.8 09/01/2019 0851   RDW 16.0 (H) 09/01/2019 0851   RDW 12.2 06/27/2016 1038   LYMPHSABS 2.3 09/01/2019 0851   LYMPHSABS 21.5 (H) 06/27/2016 1038   MONOABS 0.2 09/01/2019 0851   MONOABS 0.5 06/27/2016 1038   EOSABS 0.0 09/01/2019 0851   EOSABS 0.2 06/27/2016 1038   BASOSABS 0.0 09/01/2019 0851   BASOSABS 0.1 06/27/2016 1038   -------------------------------  Imaging from last 24 hours (if applicable):  Radiology interpretation: No results found.      This patient was seen with Dr. Benay Spice with my treatment plan reviewed with him. He expressed agreement with my medical management of this patient.  This was a shared visit with Sandi Mealy.  Ms. Goike has persistent pancytopenia.  She presents today with malaise.  I suspect the malaise is secondary to severe anemia.  She will receive a red cell transfusion today.  The etiology of her persistent nausea is unclear.  The nausea has continued despite discontinuation of cyclosporine and adjustment of the lithium dose.  She is scheduled for an appointment at [facility] tomorrow.  I will coordinate her care with the [facility] transplant team.  [name], MD

## 2019-09-01 NOTE — Progress Notes (Unsigned)
Critical lab received at 0944: ANC 0.2. PA Lucianne Lei aware.  Per PA Lucianne Lei d/t pt's low Hgb and stable VS no additional IVF orders at this time while pt is waiting for blood transfusion units.  Pt received 2 units PRBCs, tolerated well.  Reports feeling better at end of tx.  MD Benay Spice spoke w/pt during transfusions as well.  VSS.  Able to eat and drink during tx w/out any issues.  Ambulatory w/assistance to exit with belongings, friend/relative here to take pt home.

## 2019-09-01 NOTE — Patient Instructions (Signed)

## 2019-09-02 ENCOUNTER — Other Ambulatory Visit: Payer: Self-pay | Admitting: Psychiatry

## 2019-09-02 DIAGNOSIS — D6189 Other specified aplastic anemias and other bone marrow failure syndromes: Secondary | ICD-10-CM | POA: Diagnosis not present

## 2019-09-02 DIAGNOSIS — M7541 Impingement syndrome of right shoulder: Secondary | ICD-10-CM | POA: Diagnosis not present

## 2019-09-02 DIAGNOSIS — C9190 Lymphoid leukemia, unspecified not having achieved remission: Secondary | ICD-10-CM | POA: Diagnosis not present

## 2019-09-02 DIAGNOSIS — M25511 Pain in right shoulder: Secondary | ICD-10-CM | POA: Diagnosis not present

## 2019-09-02 DIAGNOSIS — D619 Aplastic anemia, unspecified: Principal | ICD-10-CM

## 2019-09-02 LAB — TYPE AND SCREEN
ABO/RH(D): O POS
Antibody Screen: NEGATIVE
Unit division: 0
Unit division: 0

## 2019-09-02 LAB — BPAM RBC
Blood Product Expiration Date: 202103082359
Blood Product Expiration Date: 202103082359
ISSUE DATE / TIME: 202102081049
ISSUE DATE / TIME: 202102081049
Unit Type and Rh: 5100
Unit Type and Rh: 5100

## 2019-09-02 MED ORDER — TACROLIMUS 1 MG CAPSULE
ORAL_CAPSULE | Freq: Two times a day (BID) | ORAL | 5 refills | 30 days | Status: CP
Start: 2019-09-02 — End: 2020-09-01
  Filled 2019-09-01: qty 60, 30d supply, fill #2

## 2019-09-03 ENCOUNTER — Ambulatory Visit: Admit: 2019-09-03 | Discharge: 2019-09-04 | Payer: PRIVATE HEALTH INSURANCE

## 2019-09-03 DIAGNOSIS — F419 Anxiety disorder, unspecified: Secondary | ICD-10-CM | POA: Diagnosis not present

## 2019-09-03 DIAGNOSIS — R5383 Other fatigue: Secondary | ICD-10-CM | POA: Diagnosis not present

## 2019-09-03 DIAGNOSIS — Z79899 Other long term (current) drug therapy: Secondary | ICD-10-CM | POA: Diagnosis not present

## 2019-09-03 DIAGNOSIS — R112 Nausea with vomiting, unspecified: Secondary | ICD-10-CM | POA: Diagnosis not present

## 2019-09-03 DIAGNOSIS — F329 Major depressive disorder, single episode, unspecified: Secondary | ICD-10-CM | POA: Diagnosis not present

## 2019-09-03 DIAGNOSIS — E039 Hypothyroidism, unspecified: Secondary | ICD-10-CM | POA: Diagnosis not present

## 2019-09-03 DIAGNOSIS — Z9221 Personal history of antineoplastic chemotherapy: Secondary | ICD-10-CM | POA: Diagnosis not present

## 2019-09-03 DIAGNOSIS — R197 Diarrhea, unspecified: Secondary | ICD-10-CM | POA: Diagnosis not present

## 2019-09-03 DIAGNOSIS — R634 Abnormal weight loss: Secondary | ICD-10-CM | POA: Diagnosis not present

## 2019-09-03 DIAGNOSIS — D801 Nonfamilial hypogammaglobulinemia: Secondary | ICD-10-CM | POA: Diagnosis not present

## 2019-09-03 DIAGNOSIS — C9110 Chronic lymphocytic leukemia of B-cell type not having achieved remission: Secondary | ICD-10-CM | POA: Diagnosis not present

## 2019-09-03 DIAGNOSIS — D619 Aplastic anemia, unspecified: Secondary | ICD-10-CM | POA: Diagnosis not present

## 2019-09-03 LAB — TACROLIMUS LEVEL: Tacrolimus (FK506) - LabCorp: 7.1 ng/mL (ref 2.0–20.0)

## 2019-09-03 MED FILL — PROMACTA 50 MG TABLET: 50 | 30 days supply | Qty: 90 | Fill #2

## 2019-09-03 MED FILL — DEFERASIROX 500 MG TBSO: 500 | 30 days supply | Qty: 60 | Fill #2

## 2019-09-03 NOTE — Unmapped (Signed)
FUNCTIONAL ASSESSMENT VISIT    Susan Kane  59 y.o. 10/20/1960   BMT MD/APP:  Jamieson/Brianne        History of Present Illness:   Today she presents with her husband.  She reports overall she has been feeling weak.  She is also having problems with persistent nausea and attributes this possibly to antibiotics and the cyclosporine.  She vomits around 2-3 times a week, usually in the evenings.  This is not every night.  She has lost weight due to this and is trying to eat more but does not have much of an appetite.  She has lost she thinks about 20lbs in the last three months.  She also has problems with diarrhea and abdominal cramping about the same times.  She usually has about 2 stools a day, and these are loose to watery.   She does not take anything for this.  She was just seen by her home oncologist yesterday.      Her daily activities are decreased due to nausea and fatigue.  She describes herself as inactive, and also attributes trying to avoid covid risk as activity limiting.  She is able to dress herself, take showers, and get her own food.  She had a cortisone injection in both shoulders, the left just done yesterday.  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  She has been given physical therapy exercises by her orthopedic surgeon Paula Libra, MD.      She tolerated ibrutinib and calabrutinib poorly with severe joint aches and arthralgias.  She then ended up in ER after the Rituxan infusion due to severe headache and only had the one cycle.  She tolerated the horse ATG okay, but describes her energy as low since that time and the last 6 months the roughest.  Cyclosporine caused much of the nausea, tacrolimus is better tolerated.  She estimates she could probably walk the skywalk to the parking deck, but would have to stop a few times.      Describes herself as active, although never a formal exerciser regularly.      Oncology History Overview Note   CLL:    Summary as per Dr. Kalman Drape most recent note with minor edits/additions from my review of records    1.??CLL?????diagnosed in August 2010, flow cytometry consistent with CLL  ?? Enlarged left??inguinal lymph node January 2019,??small??neck/axillary nodes and palpable splenomegaly 09/11/2017  ?? CTs??on 09/17/2017-3 cm necrotic appearing lymph node in the left inguinal region, borderline enlarged pelvic/retroperitoneal, chest, and axillary nodes. ??Mild splenomegaly.  ?? Ultrasound-guided biopsy of the left inguinal lymph node 09/18/2017, slightly purulent fluid aspirated, core biopsy is consistent with an atypical lymphoid proliferation???extensive necrosis with surrounding epithelioid histiocytes, limited intact lymphoid tissue involved with CLL  ?? Incisional biopsy of a necrotic/purulent left inguinal lymph node on 10/01/2017???extensive necrosis with granulomatous inflammation, small amount of viable lymphoid tissue involved with CLL, AFB and fungal stains negative  ?? Peripheral blood FISH analysis 02/05/2018??? +deletion 13q14, no evidence of p53 (17p13) deletion, no evidence of 11q22??deletion  - normal karyotype (46,XX)  ?? Bone marrow biopsy 02/26/2018???hypercellular marrow with extensive involvement by CLL, lymphocytes represent??85% of all cells  ?? Ibrutinib initiated 04/03/2018  ?? Ibrutinib placed on hold 04/11/2018 due to onset of arthralgias  ?? Ibrutinib resumed 04/16/2018, discontinued 04/25/2018 secondary to severe arthralgias/arthritis  ?? Ibrutinib resumed at a dose of 140 mg daily 05/03/2018  ?? Ibrutinib dose adjusted to 140 mg alternating with  280 mg 06/25/2018  ?? 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 ??Augmentin prescribed.  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    Baseline BM bx reviewed at [facility]  Final Diagnosis   (Outside Case #:  [id number], dated 02/26/2018)  Bone marrow, aspiration and biopsy  -  Hypercellular bone marrow (80%) with extensive involvement by chronic lymphocytic leukemia (87% lymphocytes by manual aspirate differential)  (See Comment)  -  By outside report, cytogenetic results are normal     Karyotype: 1, XX and FISH with 13q del but no 11q or 17p del    Repeat BM bx: (done after being off ibrutinib x1 month)  80% involvement by CLL    08/13/18: Presents to [facility] to discuss plan of care     Chronic lymphocytic leukemia (CLL), B-cell (CMS-HCC)   07/12/2018 Initial Diagnosis    Chronic lymphocytic leukemia (CLL), B-cell (CMS-HCC)     08/26/2018 -  Chemotherapy    Acalabrutinib started (approximate date)     11/14/2018 -  Chemotherapy    Acalabrutinib discontinued due to progressive bone marrow failure     12/06/2018 -  Chemotherapy    Ritux x1 given due to CLL still making up majority of BM cellularity (though in the setting of near aplasia of rest of TLH)     03/19/2019 -  Chemotherapy    Equine ATG / CsA for aplastic anemia (suspect 2/t acalabrutinib)       Past Medical History:   Diagnosis Date   ??? Anxiety    ??? Depression    ??? Hypothyroid      Past Surgical History:   Procedure Laterality Date   ??? CESAREAN SECTION       Current Outpatient Medications   Medication Sig Dispense Refill   ??? acetaminophen (TYLENOL) 325 MG tablet Take 650 mg by mouth every six (6) hours as needed for pain.     ??? ALPRAZolam (XANAX) 0.25 MG tablet 0.25 mg daily as needed.      ??? deferasirox (EXJADE) 500 MG disintegrating tablet Take 1,000 mg by mouth.     ??? eltrombopag (PROMACTA) 50 MG tablet Take 150 mg by mouth.     ??? folic acid (FOLVITE) 1 MG tablet Take 1 mg by mouth daily.      ??? levoFLOXacin (LEVAQUIN) 500 MG tablet Take 1 tablet (500 mg total) by mouth daily. 30 tablet 2   ??? levothyroxine (SYNTHROID) 100 MCG tablet Take 100 mcg by mouth daily.      ??? lithium (LITHOBID) 300 MG ER tablet Take 300 mg by mouth at bedtime.      ??? LORazepam (ATIVAN) 0.5 MG tablet Take 1 mg by mouth nightly as needed (sleep and nausea).     ??? ondansetron (ZOFRAN-ODT) 8 MG disintegrating tablet Take 8 mg by mouth every eight (8) hours as needed for nausea.     ??? posaconazole (NOXAFIL) 100 mg TbEC delayed released tablet Take 300 mg by mouth daily. 90 tablet 2   ??? sulfamethoxazole-trimethoprim (BACTRIM DS) 800-160 mg per tablet TAKE 1 TABLET BY MOUTH TWICE A DAY ON SATURDAY AND SUNDAY     ??? tacrolimus (PROGRAF) 1 MG capsule Take 1 capsule (1 mg total) by mouth two (2)  times a day. 60 capsule 5   ??? TRINTELLIX 20 mg tablet Take 20 mg by mouth daily.      ??? valACYclovir (VALTREX) 500 MG tablet Take 500 mg by mouth daily.      ??? zolpidem (AMBIEN) 10 mg tablet Take 10 mg by mouth nightly.        No current facility-administered medications for this visit.      Review of Systems:  Constitutional: Denies fever, chills, sweats.  (+) Weight loss as above   Eyes: Denies vision changes, eye pain, eye redness   ENT: Denies headaches, sore throat, hoarseness, sneezing, vertigo, hearing loss, tinnitus, neck pain, stiffness, dizziness, tooth pain, gum pain, mouth ulcers   Skin: Denies rashes, sores, jaundice, itching, dryness   Cardiovascular: Denies chest pain, shortness of breath (at rest or with exertion), edema   Pulmonary: Denies chest congestion, cough, SOB  Endocrine: Denies polyuria, polydipsia, heat/cold intolerance   Gastrointestinal: Denies heartburn.  (+) Moderate nausea accompanied by abdominal cramping and diarrhea as in HPI.  Denies vomiting, blood per rectum.  Genito-urinary: Denies frequency, urgency, dysuria, hematuria, nocturia   Musculoskeletal: (+) Occasional muscle aches that have been present since treatment with ibrutinib  Neurologic:  Denies numbness, tingling, weakness, changes in gait, confusion, slurred speech   Psychology:  Denies change in mood, insomnia.  Stable on anti-depression medication.   Heme/Lymph: As per HPI/PMH      Physical exam:  15, Cares for self; unable to carry on normal activity or to do active work (ECOG equivalent 1)  BP 96/61  - Pulse 75  - Temp 36.8 ??C (98.2 ??F) (Oral)  - Resp 16  - Ht 165.1 cm (5' 5)  - Wt 54.4 kg (120 lb)  - SpO2 100%  - BMI 19.97 kg/m??   General Appearance:   No acute distress, thin, chronically ill appearing  HEENT:                        Dentition No visible caries, gum disease, or infection  Lungs:                Clear to auscultation bilaterally  Heart:                           Regular rate and rhythm  Abdomen:                Soft, non-tender, non-distended  Extremities:              Warm and well perfused  Neurological:               negative  Musculoskeletal:         Positive for decreased range of motion in bilateral shoulders due to discomfort    Lab Results   Component Value Date    WBC 3.0 (L) 07/29/2019    HGB 9.2 (L) 07/29/2019    HCT 27.0 (L) 07/29/2019    PLT 31 (L) 07/29/2019       Lab Results   Component Value Date    NA 134 (L) 07/29/2019    K 4.7 07/29/2019    CL 103 07/29/2019    CO2 27.0 07/29/2019    BUN 21 07/29/2019    CREATININE 1.35 (H) 07/29/2019    GLU 141 07/29/2019    CALCIUM 9.7 07/29/2019    MG 2.0 07/29/2019    PHOS 2.3 (L) 03/22/2019  Lab Results   Component Value Date    BILITOT 0.6 07/29/2019    BILIDIR <0.10 03/22/2019    PROT 5.8 (L) 07/29/2019    ALBUMIN 3.9 07/29/2019    ALT 17 07/29/2019    AST 21 07/29/2019    ALKPHOS 104 07/29/2019       Lab Results   Component Value Date    INR 1.59 07/29/2019    APTT 35.6 07/29/2019     Assessment and Plan:  Diagnosis: Aplastic Anemia  TOIYA MORRISH  59 y.o. 06-27-1961   BMT MD/APP:  Jamieson/Brianne        Disease status: SD (stable disease)     Transplant plan and donor information:  Tx type: RIC MUD Allo Match: 10/10   Conditioning: Flu/Mel? Recipient: Blood Type: O+;CMV Status: negative        Comorbidity Index Score:   aaHCT-CI Score= 5 (4 age adjusted)    (1) Arrhythmia  af/afib   (1) Cardiac  cad/chf/mi/ef<50   (1) CVA  tia/cva   (1) Diabetes  treatment   (3) Heart Valve     (1) Hepa Mild  bili<1.5/ast<2.5   (3)Hepa Severe  bili>1.5/ast>2.5   (1) Infection  abx s/p day 0   (1) Inflam Bowel Disease     (1) Obesity  BMI > 35   (2) Peptic ulcer  treatment   (1) Psych X treatment   (2) Pulm mod  dlco 66-80   (3) Pulm severe  dlco <65   (2) Renal  CR>2 (GFR 43)   (2) Rheum  SLE/RA   (3) Solid tumor X  CLL (active)   Other Co-morbid     Age 86 and  Greater X     Timed Up and Go Test: 8.48 seconds     6 Minute Walk Test   Distance walked: 347 meters (<486m)  Oxygen saturation pre-walk: 100    Oxygen saturation post-walk: 100  Cardiovascular   ECHO: EF 60-65%  EKG: Not yet performed  Cardiology consult: Yes/No  Pulmonary   PFTs: FVC-100.8%; FEV1-93.2%, DLCOc ([place])-85.68%  CXR: No airspace disease  Caregiver plan/Concerns   Primary:   Wilber Oliphant, son   Shirleen Mcfaul, son  Veola Cafaro, daughter   Madilyn Hook, sister-in-law  ??  Backup:  Alean Rinne, husband   April Reagan, friend    Social concerns: Multiple caregivers  Other concerns: None, well resourced.  Supportive husband.     Overall Assessment and Areas for Improvement:     Disease related deficits:  Currently experiencing nausea that is fairly profound resulting in significant weight loss over last 3 months.  Unclear the exact etiology but suspected to be related to medications and could include either antibiotics or possibly exjade taken for iron overload.  Continues on promacta through local oncologist, Dr. [name].    Plan:  Discuss a trial of holding exjade to see if this will help nausea and allow better oral food intake.  Continue to follow with Dr. [name] for disease treatment and [facility] heme malignancy team.  Will discuss need for treatment of low level of CLL involvement with Dr. Oswald Hillock if pt is a candidate for transplant.      Concerns related to Comorbidities:   She reports around 60 units of PRBCs for lifetime leading to inevitable iron overload.  She also has a history of longstanding anxiety and depression that is managed by psychiatrist that she sees annually.  Stable on regimen of trintellix and lithium with xanax and ativan  as needed.   Hypothyroid managed on synthroid.  Insomnia managed with ambien.    Plan: Will refer to [facility] as needed if proceeds with transplant for additional peri-transplant support      Current physical activity:   Assistive Devices: None  Debilitated from weakness that occurred primarily after administration of equine ATG, but persists and limits activity and lifestyle.  No exercise and difficulty with normal level of activity.  Previous to disease was active without restrictions.    Plan: Work to increase physical activity as close to goal of of cardiovascular activity such as walking 5x/wk as possible    Nutrition Assessment:   Current Diet: Poor current nutrition due to persistent nausea and decreased appetite with substantial weight loss over last 3 months, but likely not adequate nutrition for an even longer period.  Currently underweight.    Plan:  Work on nausea control and increasing caloric intake with emphasis on varied diet and protein intake.  Can try natural sources of probiotics to help with gi distress in form of yogurt and similar foods.    Study Participation:   None identified at this time    *Patient Functional Assessment results will be shared with their BMT physician and any concerns will be discussed at the patient care conference or complex case review meeting as appropriate.  A written copy of the above recommendations will be sent to the patient for their records and all recommendations were discussed during their visit today.  For any questions they should contact their Nurse Coordinator prior to their next scheduled visit. I personally spent 100 minutes face-to-face and non-face-to-face in the care of this patient, which includes all pre, intra, and post visit time on the date of service.    Segundo Makela Elie Confer, [facility]  Physician Assistant  Adult Bone Marrow Transplantation    I discussed the findings with the APP. I agree with APPs' assessment and plan. Will review with the patient in a week and them present findings during weekly BMT Team Meeting.     Lanae Boast, MD  Associate Professor  Hematology/Oncology BMTCTP

## 2019-09-04 ENCOUNTER — Encounter: Payer: Self-pay | Admitting: *Deleted

## 2019-09-04 NOTE — Unmapped (Signed)
Recent background: On 12/15, Ms. Sissel was switched from cyclosporine to tacrolimus. This was mainly due to the limitations of nausea/vomiting on her levels and quality of life. If she were to get a transplant she would need to tolerate immunosuppression therapy, and so therefore the decision was made to switch her to tacrolimus.     She was started on 1 mg BID on 12/15. This dose was determined based off a target dose of 0.03 mg/kg BID with a 50% empiric dose reduction for the posaconazole interaction. She continues on 1 mg BID with no dose adjustments needed yet.    Patient's tacrolimus level from 2/8 came back 7.1 ng/mL (goal 5-15).  She should continue on tac 1 mg BID.  Her SCr continues to be elevated and fluctuating. Her LFTs are WNL. CBC continues to show low counts (ANC 0.2, PLT 33, Hgb 7.2). Of note, she is currently on Exjade 1000 mg daily and Promacta 150 mg daily.      She should remain on ppx with posaconazole indefinitely to avoid stopping/starting which impacts tacrolimus metabolism and levels. She should continue other ppx of levofloxacin while ANC < 0.5, and valtrex and Bactrim indefinitely. Should continue to get weekly tacrolimus troughs locally. I will continue to follow.       Manfred Arch, PharmD, BCOP, CPP  Pager: [phone]

## 2019-09-04 NOTE — Unmapped (Signed)
I met with Susan Kane and her husband to provide an NMDP donor update as well as assess the completion of her FAV tests.  I provided further teaching regarding our transplant process and question were answered to the satisfaction of Sarai and her husband. Time spent with patient: 20 minutes

## 2019-09-04 NOTE — Progress Notes (Signed)
Lithium level is creeping up to 1.16.  Creatinine is also increasing to 1.63.  We will need to reduce the lithium.  Please verify what dose she is currently taking.

## 2019-09-04 NOTE — Progress Notes (Unsigned)
Faxed 09/01/19 labs including tacrolimus level to [facility] att: Joellen Jersey PharmD [phone]

## 2019-09-06 NOTE — Unmapped (Signed)
Overall Assessment and Areas for Improvement:     Disease related deficits:  Currently experiencing nausea that is fairly profound resulting in significant weight loss over last 3 months.  Unclear the exact etiology but suspected to be related to medications and could include either antibiotics or possibly exjade taken for iron overload.  Continues on promacta through local oncologist, Dr. [name].    Plan:  Discuss a trial of holding exjade to see if this will help nausea and allow better oral food intake.  Continue to follow with Dr. [name] for disease treatment and [facility] heme malignancy team.  Will discuss need for treatment of low level of CLL involvement with Dr. Oswald Hillock if pt is a candidate for transplant.      Concerns related to Comorbidities:   She reports around 60 units of PRBCs for lifetime leading to inevitable iron overload.  She also has a history of longstanding anxiety and depression that is managed by psychiatrist that she sees annually.  Stable on regimen of trintellix and lithium with xanax and ativan as needed.   Hypothyroid managed on synthroid.  Insomnia managed with ambien.    Plan: Will refer to [facility] as needed if proceeds with transplant for additional peri-transplant support      Current physical activity:   Assistive Devices: None  Debilitated from weakness that occurred primarily after administration of equine ATG, but persists and limits activity and lifestyle.  No exercise and difficulty with normal level of activity.  Previous to disease was active without restrictions.    Plan: Work to increase physical activity as close to goal of of cardiovascular activity such as walking 5x/wk as possible    Nutrition Assessment:   Current Diet: Poor current nutrition due to persistent nausea and decreased appetite with substantial weight loss over last 3 months, but likely not adequate nutrition for an even longer period.  Currently underweight.    Plan:  Work on nausea control and increasing caloric intake with emphasis on varied diet and protein intake.  Can try natural sources of probiotics to help with gi distress in form of yogurt and similar foods.    Study Participation:   None identified at this time    *Patient Functional Assessment results will be shared with their BMT physician and any concerns will be discussed at the patient care conference or complex case review meeting as appropriate.  A written copy of the above recommendations will be sent to the patient for their records and all recommendations were discussed during their visit today.  For any questions they should contact their Nurse Coordinator prior to their next scheduled visit.

## 2019-09-08 ENCOUNTER — Inpatient Hospital Stay: Payer: BC Managed Care – PPO

## 2019-09-08 ENCOUNTER — Other Ambulatory Visit: Payer: Self-pay | Admitting: *Deleted

## 2019-09-08 ENCOUNTER — Inpatient Hospital Stay (HOSPITAL_BASED_OUTPATIENT_CLINIC_OR_DEPARTMENT_OTHER): Payer: BC Managed Care – PPO | Admitting: Nurse Practitioner

## 2019-09-08 ENCOUNTER — Encounter: Payer: Self-pay | Admitting: Nurse Practitioner

## 2019-09-08 ENCOUNTER — Other Ambulatory Visit: Payer: Self-pay | Admitting: Nurse Practitioner

## 2019-09-08 ENCOUNTER — Other Ambulatory Visit: Payer: Self-pay

## 2019-09-08 VITALS — BP 105/67 | HR 88 | Temp 98.5°F | Resp 18 | Ht 66.0 in | Wt 117.6 lb

## 2019-09-08 DIAGNOSIS — M25511 Pain in right shoulder: Secondary | ICD-10-CM | POA: Diagnosis not present

## 2019-09-08 DIAGNOSIS — D61818 Other pancytopenia: Secondary | ICD-10-CM | POA: Diagnosis not present

## 2019-09-08 DIAGNOSIS — C9110 Chronic lymphocytic leukemia of B-cell type not having achieved remission: Secondary | ICD-10-CM | POA: Diagnosis not present

## 2019-09-08 DIAGNOSIS — D649 Anemia, unspecified: Secondary | ICD-10-CM

## 2019-09-08 DIAGNOSIS — R251 Tremor, unspecified: Secondary | ICD-10-CM | POA: Diagnosis not present

## 2019-09-08 DIAGNOSIS — E039 Hypothyroidism, unspecified: Secondary | ICD-10-CM | POA: Diagnosis not present

## 2019-09-08 DIAGNOSIS — H2513 Age-related nuclear cataract, bilateral: Secondary | ICD-10-CM | POA: Diagnosis not present

## 2019-09-08 DIAGNOSIS — M1990 Unspecified osteoarthritis, unspecified site: Secondary | ICD-10-CM | POA: Diagnosis not present

## 2019-09-08 DIAGNOSIS — Z95828 Presence of other vascular implants and grafts: Secondary | ICD-10-CM

## 2019-09-08 DIAGNOSIS — D801 Nonfamilial hypogammaglobulinemia: Secondary | ICD-10-CM | POA: Diagnosis not present

## 2019-09-08 DIAGNOSIS — D696 Thrombocytopenia, unspecified: Secondary | ICD-10-CM

## 2019-09-08 DIAGNOSIS — R519 Headache, unspecified: Secondary | ICD-10-CM | POA: Diagnosis not present

## 2019-09-08 DIAGNOSIS — R112 Nausea with vomiting, unspecified: Secondary | ICD-10-CM | POA: Diagnosis not present

## 2019-09-08 DIAGNOSIS — Z79899 Other long term (current) drug therapy: Secondary | ICD-10-CM | POA: Diagnosis not present

## 2019-09-08 DIAGNOSIS — J189 Pneumonia, unspecified organism: Secondary | ICD-10-CM | POA: Diagnosis not present

## 2019-09-08 LAB — CMP (CANCER CENTER ONLY)
ALT: 12 U/L (ref 0–44)
AST: 11 U/L — ABNORMAL LOW (ref 15–41)
Albumin: 3.9 g/dL (ref 3.5–5.0)
Alkaline Phosphatase: 110 U/L (ref 38–126)
Anion gap: 8 (ref 5–15)
BUN: 20 mg/dL (ref 6–20)
CO2: 24 mmol/L (ref 22–32)
Calcium: 9.4 mg/dL (ref 8.9–10.3)
Chloride: 108 mmol/L (ref 98–111)
Creatinine: 1.1 mg/dL — ABNORMAL HIGH (ref 0.44–1.00)
GFR, Est AFR Am: >60 mL/min (ref >60)
GFR, Estimated: 55 mL/min — ABNORMAL LOW (ref >60)
Glucose, Bld: 131 mg/dL — ABNORMAL HIGH (ref 70–99)
Potassium: 3.8 mmol/L (ref 3.5–5.1)
Sodium: 140 mmol/L (ref 135–145)
Total Bilirubin: 0.5 mg/dL (ref 0.3–1.2)
Total Protein: 5.7 g/dL — ABNORMAL LOW (ref 6.5–8.1)

## 2019-09-08 LAB — CBC WITH DIFFERENTIAL (CANCER CENTER ONLY)
Abs Immature Granulocytes: 0.02 10*3/uL (ref 0.00–0.07)
Basophils Absolute: 0 10*3/uL (ref 0.0–0.1)
Basophils Relative: 0 %
Eosinophils Absolute: 0 10*3/uL (ref 0.0–0.5)
Eosinophils Relative: 1 %
HCT: 28.3 % — ABNORMAL LOW (ref 36.0–46.0)
Hemoglobin: 9.6 g/dL — ABNORMAL LOW (ref 12.0–15.0)
Immature Granulocytes: 1 %
Lymphocytes Relative: 92 %
Lymphs Abs: 3.4 10*3/uL (ref 0.7–4.0)
MCH: 29.5 pg (ref 26.0–34.0)
MCHC: 33.9 g/dL (ref 30.0–36.0)
MCV: 87.1 fL (ref 80.0–100.0)
Monocytes Absolute: 0.2 10*3/uL (ref 0.1–1.0)
Monocytes Relative: 4 %
Neutro Abs: 0.1 10*3/uL — CL (ref 1.7–7.7)
Neutrophils Relative %: 2 %
Platelet Count: 28 10*3/uL — ABNORMAL LOW (ref 150–400)
RBC: 3.25 MIL/uL — ABNORMAL LOW (ref 3.87–5.11)
RDW: 15.1 % (ref 11.5–15.5)
WBC Count: 3.7 10*3/uL — ABNORMAL LOW (ref 4.0–10.5)
nRBC: 0 % (ref 0.0–0.2)

## 2019-09-08 LAB — LITHIUM LEVEL: Lithium Lvl: 0.92 mmol/L (ref 0.60–1.20)

## 2019-09-08 LAB — SAMPLE TO BLOOD BANK

## 2019-09-08 LAB — MAGNESIUM: Magnesium: 1.6 mg/dL — ABNORMAL LOW (ref 1.7–2.4)

## 2019-09-08 MED ORDER — SODIUM CHLORIDE 0.9% FLUSH
10.0000 mL | INTRAVENOUS | Status: DC | PRN
  Administered 2019-09-08: 10 mL
  Filled 2019-09-08: qty 10

## 2019-09-08 NOTE — Progress Notes (Addendum)
[place]   [phone] [phone]   Clinic Follow up Note   Patient Care Team: Lavone Orn, MD as PCP - General (Internal Medicine) Linda Hedges, DO as Consulting Physician (Obstetrics and Gynecology) Clovis Pu, Billey Co., MD as Attending Physician (Psychiatry) 09/08/2019  CHIEF COMPLAINT: F/u CLL, aplastic anemia   CURRENT THERAPY: Tacrolimus, Promacta, transfusion support as needed   INTERVAL HISTORY: Meagan Walsh returns for f/u as scheduled. She received a blood transfusion on 09/01/19.  She was seen at [facility] on 09/03/19. She is about the same. She continues to have vaginal bleeding which is lighter, no other bleeding. Scattered bruises to legs are "old." Nausea is stable, with vomiting at night some days. She stopped exjade per [facility] for last 5 days, nausea has not improved. Takes zofran daily and occasionally compazine at night. She also notes intermittent diarrhea at most 3 times daily, with abdominal cramps. Appetite is low. Denies fever, chills, cough, chest pain, dyspnea.    MEDICAL HISTORY:  Past Medical History:  Diagnosis Date  . Anxiety   . Cancer ([place])    CLL  . Degenerative disk disease    Neck--chronic pain  . Depression   . Fibroids    Uterine  . 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 Hypothyroidism   . Lymphocytosis   . Mitral valve prolapse   . Mitral valve prolapse   . Thyroid disease    Hypothyroid    SURGICAL HISTORY: Past Surgical History:  Procedure Laterality Date  . BREAST BIOPSY Left 2000  . CESAREAN SECTION  1996  . MELANOMA EXCISION WITH SENTINEL LYMPH NODE BIOPSY Left 10/01/2017   Procedure: EXCISIONAL BIOPSY OF LEFT INGUINAL LYMPH NODE;  Surgeon: Greer Pickerel, MD;  Location: [facility];  Service: General;  Laterality: Left;  . REVISION OF ABDOMINAL SCAR    . TONSILLECTOMY     age 34    I have reviewed the social history and family history with the patient and they are unchanged  from previous note.  ALLERGIES:  has No Known Allergies.  MEDICATIONS:  Current Outpatient Medications  Medication Sig Dispense Refill  . acetaminophen (TYLENOL) 325 MG tablet Take 650 mg by mouth every 6 (six) hours as needed.    . ALPRAZolam (XANAX) 0.25 MG tablet TAKE 1 TO 2 TABLETS BY MOUTH EVERY DAY AS NEEDED FOR ANXIETY 30 tablet 3  . cyclobenzaprine (FLEXERIL) 10 MG tablet Take 1 tablet (10 mg total) by mouth 2 (two) times daily as needed for muscle spasms. Do NOT take with xanax or ambien 30 tablet 0  . deferasirox (EXJADE) 500 MG disintegrating tablet Take 2 tablets (1,000 mg total) by mouth daily before breakfast. Place tablets in water, orange/apple juice, avoid milk. Stir to form suspension and drink (Patient taking differently: Take 500 mg by mouth daily before breakfast. Place tablets in water, orange/apple juice, avoid milk. Stir to form suspension and drink) 60 tablet 2  . diazepam (VALIUM) 5 MG tablet Take #1 tablet 1 hour prior to MRI scan--DO NOT DRIVE 1 tablet 0  . eltrombopag (PROMACTA) 50 MG tablet Take 3 tablets (150 mg total) by mouth daily. Take on an empty stomach 1 hour before a meal or 2 hours after 90 tablet 2  . folic acid (FOLVITE) 1 MG tablet TAKE 1 TABLET BY MOUTH EVERY DAY 90 tablet 0  . levofloxacin (LEVAQUIN) 500 MG tablet Take 1 tablet (500 mg total) by mouth daily. [place]  tablet 0  . levonorgestrel (MIRENA) 20 MCG/24HR IUD 1 each by Intrauterine route once.     [name] levothyroxine (SYNTHROID) 100 MCG tablet TAKE 1 TABLET BY MOUTH EVERY DAY 30 tablet 2  . lithium carbonate (LITHOBID) 300 MG CR tablet TAKE 2 TABLETS BY MOUTH EVERY DAY AT BEDTIME 180 tablet 1  . LORazepam (ATIVAN) 0.5 MG tablet Take 1 tablet (0.5 mg total) by mouth 2 (two) times daily as needed (prior to tacrolimus dose if needed). 60 tablet 0  . ondansetron (ZOFRAN-ODT) 8 MG disintegrating tablet DISSOLVE 1 TABLET EVERY 8 HOURS AS NEEDED FOR NAUSEA OR VOMITING 30 tablet 1  . oxyCODONE-acetaminophen  (PERCOCET/ROXICET) 5-325 MG tablet Take 1 tablet by mouth every 8 (eight) hours as needed for moderate pain or severe pain. 30 tablet 0  . posaconazole (NOXAFIL) 100 MG TBEC delayed-release tablet TAKE 3 TABLETS BY MOUTH DAILY 90 tablet 1  . sulfamethoxazole-trimethoprim (BACTRIM DS) 800-160 MG tablet Take 1 tablet by mouth 2 (two) times a week. 16 tablet 5  . SUMAtriptan (IMITREX) 20 MG/ACT nasal spray Place 1 spray (20 mg total) into the nose daily as needed for migraine or headache. 12 Inhaler 6  . tacrolimus (PROGRAF) 1 MG capsule Take 1 mg by mouth 2 (two) times daily.     . TRINTELLIX 20 MG TABS tablet TAKE 1 TABLET BY MOUTH EVERY DAY 90 tablet 0  . valACYclovir (VALTREX) 500 MG tablet TAKE 1 TABLET BY MOUTH TWICE A DAY 180 tablet 1  . zolpidem (AMBIEN) 10 MG tablet TAKE 1/2 TO 1 TABLET BY MOUTH AT BEDTIME AS NEEDED FOR INSOMNIA. 30 tablet 2  . prochlorperazine (COMPAZINE) 5 MG tablet Take 1-2 tablets (5-10 mg total) by mouth every 6 (six) hours as needed for up to 3 days for nausea or vomiting. 60 tablet 1   No current facility-administered medications for this visit.   Facility-Administered Medications Ordered in Other Visits  Medication Dose Route Frequency Provider Last Rate Last Admin  . 0.9 %  sodium chloride infusion (Manually program via Guardrails IV Fluids)  250 mL Intravenous Once Betsy Coder B, MD      . 0.9 %  sodium chloride infusion (Manually program via Guardrails IV Fluids)  250 mL Intravenous Once Ladell Pier, MD      . famotidine (PEPCID) IVPB 20 mg premix  20 mg Intravenous Once Betsy Coder B, MD      . heparin lock flush 100 unit/mL  250 Units Intracatheter PRN Betsy Coder B, MD      . sodium chloride flush (NS) 0.9 % injection 10 mL  10 mL Intracatheter PRN Ladell Pier, MD   10 mL at 02/21/19 0945  . sodium chloride flush (NS) 0.9 % injection 10 mL  10 mL Intracatheter PRN Ladell Pier, MD      . SUMAtriptan (IMITREX) nasal spray 20 mg  20 mg  Nasal Once Ladell Pier, MD        PHYSICAL EXAMINATION:  Vitals:   09/08/19 1134  BP: 105/67  Pulse: 88  Resp: 18  Temp: 98.5 F (36.9 C)  SpO2: 100%   Filed Weights   09/08/19 1134  Weight: 117 lb 9.6 oz (53.3 kg)    GENERAL:alert, no distress and comfortable SKIN: bruise to right lower leg. No rash  EYES:  sclera clear OROPHARYNX: gingival erythema  LUNGS: clear with normal breathing effort HEART: regular rate & rhythm, no lower extremity edema ABDOMEN: abdomen soft, non-tender and normal bowel  sounds NEURO: alert & oriented x 3 with fluent speech PICC site C/D/I   LABORATORY DATA:  I have reviewed the data as listed CBC Latest Ref Rng & Units 09/08/2019 09/01/2019 08/26/2019  WBC 4.0 - 10.5 K/uL 3.7(L) 2.6(L) 1.9(L)  Hemoglobin 12.0 - 15.0 g/dL 9.6(L) 7.2(L) 8.3(L)  Hematocrit 36.0 - 46.0 % 28.3(L) 21.3(L) 24.7(L)  Platelets 150 - 400 K/uL 28(L) 33(L) 28(L)     CMP Latest Ref Rng & Units 09/08/2019 09/01/2019 08/26/2019  Glucose 70 - 99 mg/dL 131(H) 131(H) 139(H)  BUN 6 - 20 mg/dL '20 17 17  ' Creatinine 0.44 - 1.00 mg/dL 1.10(H) 1.63(H) 1.33(H)  Sodium 135 - 145 mmol/L 140 139 139  Potassium 3.5 - 5.1 mmol/L 3.8 3.8 4.4  Chloride 98 - 111 mmol/L 108 107 108  CO2 22 - 32 mmol/L '24 23 24  ' Calcium 8.9 - 10.3 mg/dL 9.4 9.6 9.4  Total Protein 6.5 - 8.1 g/dL 5.7(L) 5.7(L) 5.6(L)  Total Bilirubin 0.3 - 1.2 mg/dL 0.5 0.4 0.6  Alkaline Phos 38 - 126 U/L 110 114 110  AST 15 - 41 U/L 11(L) 10(L) 12(L)  ALT 0 - 44 U/L '12 11 15      ' RADIOGRAPHIC STUDIES: I have personally reviewed the radiological images as listed and agreed with the findings in the report. No results found.   ASSESSMENT & PLAN:   1.CLL-diagnosed in August 2010, flow cytometry consistent with CLL Enlarged leftinguinal lymph node January 2019,smallneck/axillary nodes and palpable splenomegaly 09/11/2017  CTson 09/17/2017-3 cm necrotic appearing lymph node in the left inguinal region, borderline  enlarged pelvic/retroperitoneal, chest, and axillary nodes. Mild splenomegaly.  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, no evidence of 11q22deletion  Bone marrow biopsy 02/26/2018-hypercellular marrow with extensive involvement by CLL, lymphocytes represent85% of all cells  Ibrutinib initiated 04/03/2018  Ibrutinib placed on hold 04/11/2018 due to onset of arthralgias  Ibrutinib resumed 04/16/2018, discontinued 04/25/2018 secondary to severe arthralgias/arthritis  Ibrutinib resumed at a dose of 140 mg daily 05/03/2018  Ibrutinib dose adjusted to 140 mg alternating with 216m 06/25/2018  Ibrutinib discontinued 07/03/2018 secondary to severe arthralgias  Acalabrutinib 08/16/2018, discontinued 11/15/2018 secondary to persistent severe transfusion dependent anemia and neutropenia/thrombocytopenia, last dose 11/14/2018  Bone marrow biopsy 11/21/2018-decreased cellularity, involvement by CLL, decreased erythroid and granulocytic precursors, decreased megakaryocytes  Cycle 1 rituximab 12/06/2018  Bone marrow biopsy 12/24/2018 at [facility]-hypocellular bone marrow (10%) involved by CLL, representing 50% of marrow cellularity; markedly decreased trilineage hematopoiesis including essentially absent erythropoiesis  Bone marrow biopsy 03/06/2027 [facility]-hypocellular bone marrow (20%) with marked reduction of maturing hematopoietic elements; numerous lymphoid aggregates  consistent with involvement by CLL, representing approximately 50% of marrow cellularity by flow cytometry  [facility] by prednisone taper  Eltrombopag beginning 07/05/2019  Cyclosporine discontinued 07/08/2019  Tacrolimus 07/10/2019  2.Hypothyroidism 3.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 Augmentin prescribed. 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 Pancytopenia secondary to CLL and a hypocellular bone marrow  G-CSF and Nplate started 1/44/8185, G-CSF changed to daily beginning 12/17/2018; G-CSF discontinued 12/31/2018, last Nplate 01/15/2019  Began prednisone 60 mg daily 01/11/19, tolerating moderately well except jitteriness, irritability, and difficulty sleeping. Tapered to 40 mg daily x1 week starting 01/24/19, reduce by 10 mg each week until discontinued. Prednisone discontinued 02/20/2019.  Promacta started 01/20/2019, dose increased to 100 mg daily 02/04/2019; dose increased to 150 mg daily 02/21/2019  IVIG daily for 2 days beginning 02/25/2019  9. 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 No lymphadenopathy or splenomegaly. Exjade beginning 06/28/2019. 13. Elevated creatinine-cyclosporine toxicity? Persistent 14.Vaginal bleeding beginning 07/13/2019-Mirena device removed 15.Elevated lithium level  08/08/2019, lithium toxicity?-Lithium held 08/08/2019-08/12/2019, then resumed at a lower dose   Disposition:  Meagan Walsh appears stable. She continues to have severe pancytopenia, plt 28K, ANC 0.1, Hg 9.6 after RBC transfusion on 2/10. No transfusions indicated today. CMP, lithium, and tacro levels are pending.   For nausea, I recommend to continue zofran and compazine PRN, and to add ativan for refractory nausea/vomiting. Her nausea did not improve after holding exjade, she will restart that today.  I recommend for her to use imodium PRN for diarrhea to prevent dehydration. She agrees. She declined an appetite stimulant today. She will try supplements.   She will be seen again at [facility] 09/09/19 by Dr. Algernon Huxley to discuss bone marrow transplant, we will f/u on the plan. She knows to contact our clinic for bleeding, fever, chills, other signs of infection, or s/s of progressive anemia. She will call her Ob/Gyn to f/u on vaginal bleeding. She will return for lab (including CBC, CMP, Mg, lithium, and tacro levels) and an office visit on 09/15/19. The patient was seen with Dr. Benay Spice today.   No problem-specific Assessment & Plan notes found for this encounter.   Orders Placed This Encounter  Procedures  . CBC with Differential (Cancer Center Only)    Standing Status:   Future    Standing Expiration Date:   09/07/2020  . CMP ([facility] only)    Standing Status:   Future    Standing Expiration Date:   09/07/2020  . Magnesium    Standing Status:   Future    Standing Expiration Date:   09/07/2020  . Lithium level    Standing Status:   Future    Standing Expiration Date:   09/07/2020  . Tacrolimus level    Standing Status:   Future    Standing Expiration Date:   09/07/2020  . Sample to Blood Bank    Standing Status:   Future    Standing Expiration Date:   09/07/2020   All questions were answered. The patient knows to call the clinic with any problems, questions or concerns. No barriers to learning  was detected.     Alla Feeling, NP 09/08/19  This was a shared visit with Cira Rue.  Meagan Walsh has persistent pancytopenia.  The renal mood today.  She continues a transplant evaluation at [facility].  I will contact the physicians within the next few days to review her case.  The etiology of her nausea and intermittent diarrhea remains unclear.  She does not require.  We recommended she contact her gynecologist for management  of persistent vaginal bleeding.  [name], MD

## 2019-09-09 ENCOUNTER — Encounter: Admit: 2019-09-09 | Discharge: 2019-09-10 | Payer: PRIVATE HEALTH INSURANCE

## 2019-09-09 DIAGNOSIS — F419 Anxiety disorder, unspecified: Secondary | ICD-10-CM | POA: Diagnosis not present

## 2019-09-09 DIAGNOSIS — Z9221 Personal history of antineoplastic chemotherapy: Secondary | ICD-10-CM | POA: Diagnosis not present

## 2019-09-09 DIAGNOSIS — D801 Nonfamilial hypogammaglobulinemia: Secondary | ICD-10-CM | POA: Diagnosis not present

## 2019-09-09 DIAGNOSIS — D619 Aplastic anemia, unspecified: Secondary | ICD-10-CM | POA: Diagnosis not present

## 2019-09-09 DIAGNOSIS — R161 Splenomegaly, not elsewhere classified: Secondary | ICD-10-CM | POA: Diagnosis not present

## 2019-09-09 DIAGNOSIS — C9110 Chronic lymphocytic leukemia of B-cell type not having achieved remission: Secondary | ICD-10-CM | POA: Diagnosis not present

## 2019-09-09 DIAGNOSIS — F329 Major depressive disorder, single episode, unspecified: Secondary | ICD-10-CM | POA: Diagnosis not present

## 2019-09-09 DIAGNOSIS — E039 Hypothyroidism, unspecified: Secondary | ICD-10-CM | POA: Diagnosis not present

## 2019-09-09 DIAGNOSIS — R630 Anorexia: Secondary | ICD-10-CM | POA: Diagnosis not present

## 2019-09-09 DIAGNOSIS — B1910 Unspecified viral hepatitis B without hepatic coma: Secondary | ICD-10-CM | POA: Diagnosis not present

## 2019-09-09 NOTE — Unmapped (Signed)
BMT CLINIC FOLLOW   DISCUSSION OF RESULTS OF FUNCTIONAL ASSESSMENT VISIT    Susan Kane  59 y.o. 10-26-1960   BMT MD/APP:  Brehanna Deveny/Brianne        History of Present Illness:   Today Susan Kane presents with her husband.  She reports overall feeling weak.  She continues to experience persistent nausea and attributes this possibly to antibiotics and tacrolimus. She vomits around 2-3 times a week, usually in the evenings.  This is not every night.  She has lost weight due to this: her weight on 06/14/19 was 63 kg and today she weighs 53 kg.   She is trying to eat more but does not have much of an appetite. She also has problems with diarrhea and abdominal cramping about the same times.  She usually has about 2 stools a day, and these are loose to watery.   She does not take anything for this.  She was just seen by her home oncologist yesterday.      She reports that all these symptoms started after she received ATG. GI symptoms were worse when she was on CSA and are better on tacrolimus but still persist.     Her daily activities are decreased due to nausea and fatigue.  She describes herself as inactive, and also attributes trying to avoid Covid risk as activity limiting.  She is able to dress herself, take showers, and get her own food.  She had a cortisone injection in both shoulders, the left just done just recently.  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  She has been given physical therapy exercises by her orthopedic surgeon Paula Libra, MD.      Susan Kane consistently struggled with therapy for CLL and AA. She tolerated ibrutinib and calabrutinib poorly with severe joint aches and arthralgias.  She then ended up in ER after the Rituxan infusion due to severe headache and only had the one cycle.  She tolerated the infusion of horse ATG okay, but describes her energy as low since that time and the last 6 months the roughest.  Cyclosporine caused much of the nausea, tacrolimus is better tolerated.  She estimates she could probably walk the skywalk to the parking deck, but would have to stop a few times.      I discussed the course of treatment with patient's primary oncologist. He felt that after a dose of Rituxan her counts briefly improved with ANC reaching 1.4 and plt 45,000.  After ATG her counts also briefly improved but that did  not last. She recently started Promacta, after which she no longer required plt transfusions. However she remains dependent on PRBC transfusions.     Her GI symptoms have been puzzling. Initially attributed to CSA, patient was switched to tacrolimus which did not help all that much. Her physician tried to stop Levaquin, then Exjade without much much improvement.   She has been on lithium for a long time and in the past tolerated it well.     Oncology History Overview Note   CLL:    Summary as per Dr. Kalman Drape most recent note with minor edits/additions from my review of records    1.??CLL?????diagnosed in August 2010, flow cytometry consistent with CLL  ?? Enlarged left??inguinal lymph node January 2019,??small??neck/axillary nodes and palpable splenomegaly 09/11/2017  ?? CTs??on 09/17/2017-3 cm necrotic appearing lymph node in the left inguinal region, borderline enlarged pelvic/retroperitoneal, chest, and axillary nodes. ??Mild splenomegaly.  ??  Ultrasound-guided biopsy of the left inguinal lymph node 09/18/2017, slightly purulent fluid aspirated, core biopsy is consistent with an atypical lymphoid proliferation???extensive necrosis with surrounding epithelioid histiocytes, limited intact lymphoid tissue involved with CLL  ?? Incisional biopsy of a necrotic/purulent left inguinal lymph node on 10/01/2017???extensive necrosis with granulomatous inflammation, small amount of viable lymphoid tissue involved with CLL, AFB and fungal stains negative  ?? Peripheral blood FISH analysis 02/05/2018??? +deletion 13q14, no evidence of p53 (17p13) deletion, no evidence of 11q22??deletion  - normal karyotype (46,XX)  ?? Bone marrow biopsy 02/26/2018???hypercellular marrow with extensive involvement by CLL, lymphocytes represent??85% of all cells  ?? Ibrutinib initiated 04/03/2018  ?? Ibrutinib placed on hold 04/11/2018 due to onset of arthralgias  ?? Ibrutinib resumed 04/16/2018, discontinued 04/25/2018 secondary to severe arthralgias/arthritis  ?? Ibrutinib resumed at a dose of 140 mg daily 05/03/2018  ?? Ibrutinib dose adjusted to 140 mg alternating with 280 mg 06/25/2018  ?? 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 ??Augmentin prescribed.  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    Baseline BM bx reviewed at [facility]  Final Diagnosis   (Outside Case #:  [id number], dated 02/26/2018)  Bone marrow, aspiration and biopsy  -  Hypercellular bone marrow (80%) with extensive involvement by chronic lymphocytic leukemia (87% lymphocytes by manual aspirate differential)  (See Comment)  -  By outside report, cytogenetic results are normal     Karyotype: 22, XX and FISH with 13q del but no 11q or 17p del    Repeat BM bx: (done after being off ibrutinib x1 month)  80% involvement by CLL    08/13/18: Presents to [facility] to discuss plan of care     Chronic lymphocytic leukemia (CLL), B-cell (CMS-HCC)   07/12/2018 Initial Diagnosis    Chronic lymphocytic leukemia (CLL), B-cell (CMS-HCC)     08/26/2018 -  Chemotherapy    Acalabrutinib started (approximate date)     11/14/2018 -  Chemotherapy    Acalabrutinib discontinued due to progressive bone marrow failure     12/06/2018 -  Chemotherapy    Ritux x1 given due to CLL still making up majority of BM cellularity (though in the setting of near aplasia of rest of TLH)     03/19/2019 -  Chemotherapy    Equine ATG / CsA for aplastic anemia (suspect 2/t acalabrutinib)       Past Medical History:   Diagnosis Date   ??? Anxiety    ??? Depression    ??? Hypothyroid      Past Surgical History:   Procedure Laterality Date   ??? CESAREAN SECTION       Current Outpatient Medications   Medication Sig Dispense Refill   ??? acetaminophen (TYLENOL) 325 MG tablet Take 650 mg by mouth every six (6) hours as needed for pain.     ??? ALPRAZolam (XANAX) 0.25 MG tablet 0.25 mg daily as needed.      ??? deferasirox (EXJADE) 500 MG disintegrating tablet Take 1,000 mg by mouth.     ??? eltrombopag (PROMACTA) 50 MG tablet Take 150 mg by mouth.     ??? folic acid (FOLVITE) 1 MG tablet Take 1 mg by mouth daily.      ??? levoFLOXacin (LEVAQUIN) 500 MG  tablet Take 1 tablet (500 mg total) by mouth daily. 30 tablet 2   ??? levothyroxine (SYNTHROID) 100 MCG tablet Take 100 mcg by mouth daily.      ??? lithium (LITHOBID) 300 MG ER tablet Take 300 mg by mouth at bedtime.      ??? LORazepam (ATIVAN) 0.5 MG tablet Take 1 mg by mouth nightly as needed (sleep and nausea).     ??? ondansetron (ZOFRAN-ODT) 8 MG disintegrating tablet Take 8 mg by mouth every eight (8) hours as needed for nausea.     ??? posaconazole (NOXAFIL) 100 mg TbEC delayed released tablet Take 300 mg by mouth daily. 90 tablet 2   ??? sulfamethoxazole-trimethoprim (BACTRIM DS) 800-160 mg per tablet TAKE 1 TABLET BY MOUTH TWICE A DAY ON SATURDAY AND SUNDAY     ??? tacrolimus (PROGRAF) 1 MG capsule Take 1 capsule (1 mg total) by mouth two (2) times a day. 60 capsule 5   ??? TRINTELLIX 20 mg tablet Take 20 mg by mouth daily.      ??? valACYclovir (VALTREX) 500 MG tablet Take 500 mg by mouth daily.      ??? zolpidem (AMBIEN) 10 mg tablet Take 10 mg by mouth nightly.        No current facility-administered medications for this visit.      Review of Systems:  Constitutional: Denies fever, chills, sweats.  (+) Weight loss as above   Eyes: Denies vision changes, eye pain, eye redness   ENT: Denies headaches, sore throat, hoarseness, sneezing, vertigo, hearing loss, tinnitus, neck pain, stiffness, dizziness, tooth pain, gum pain, mouth ulcers   Skin: Denies rashes, sores, jaundice, itching, dryness   Cardiovascular: Denies chest pain, shortness of breath (at rest or with exertion), edema   Pulmonary: Denies chest congestion, cough, SOB  Endocrine: Denies polyuria, polydipsia, heat/cold intolerance   Gastrointestinal: Denies heartburn.  (+) Moderate nausea accompanied by abdominal cramping and diarrhea as in HPI.  Denies vomiting, blood per rectum.  Genito-urinary: Denies frequency, urgency, dysuria, hematuria, nocturia   Musculoskeletal: (+) Occasional muscle aches that have been present since treatment with ibrutinib  Neurologic:  Denies numbness, tingling, weakness, changes in gait, confusion, slurred speech   Psychology:  Denies change in mood, insomnia.  Stable on anti-depression medication.   Heme/Lymph: As per HPI/PMH      Physical exam:  70, Cares for self; unable to carry on normal activity or to do active work (ECOG equivalent 1)  BP 110/55  - Pulse 91  - Temp 36.8 ??C (98.3 ??F) (Oral)  - Resp 17  - Ht 165.1 cm (5' 5)  - Wt 53.1 kg (117 lb 1.6 oz)  - SpO2 98%  - BMI 19.49 kg/m??   General Appearance:   No acute distress, thin, chronically ill appearing  HEENT:                        Dentition No visible caries, gum disease, or infection  Lungs:                Clear to auscultation bilaterally  Heart:                           Regular rate and rhythm  Abdomen:                Soft, non-tender, non-distended  Extremities:              Warm  and well perfused  Neurological:               Nonfocal and grossly intact.   Musculoskeletal:         Positive for decreased range of motion in bilateral shoulders due to discomfort    Lab Results   Component Value Date    WBC 3.0 (L) 07/29/2019    HGB 9.2 (L) 07/29/2019    HCT 27.0 (L) 07/29/2019    PLT 31 (L) 07/29/2019       Lab Results   Component Value Date    NA 134 (L) 07/29/2019    K 4.7 07/29/2019    CL 103 07/29/2019    CO2 27.0 07/29/2019    BUN 21 07/29/2019    CREATININE 1.35 (H) 07/29/2019    GLU 141 07/29/2019    CALCIUM 9.7 07/29/2019    MG 2.0 07/29/2019    PHOS 2.3 (L) 03/22/2019       Lab Results   Component Value Date    BILITOT 0.6 07/29/2019    BILIDIR <0.10 03/22/2019    PROT 5.8 (L) 07/29/2019    ALBUMIN 3.9 07/29/2019    ALT 17 07/29/2019    AST 21 07/29/2019    ALKPHOS 104 07/29/2019       Lab Results   Component Value Date    INR 1.59 07/29/2019    APTT 35.6 07/29/2019     Assessment and Plan:  Diagnosis: Aplastic Anemia  DASHA KAWABATA  59 y.o. 09-18-1960   BMT MD/APP:  Cataleyah Colborn/Brianne        Disease status: SD (stable disease)     Transplant plan and donor information:  Tx type: RIC MUD Allo Match: 10/10   Conditioning: Flu/Mel? Recipient: Blood Type: O+;CMV Status: negative        Comorbidity Index Score:   aaHCT-CI Score= 5 (4 age adjusted)    (1) Arrhythmia  af/afib   (1) Cardiac  cad/chf/mi/ef<50   (1) CVA  tia/cva   (1) Diabetes  treatment   (3) Heart Valve     (1) Hepa Mild  bili<1.5/ast<2.5   (3)Hepa Severe  bili>1.5/ast>2.5   (1) Infection  abx s/p day 0   (1) Inflam Bowel Disease     (1) Obesity  BMI > 35   (2) Peptic ulcer  treatment   (1) Psych X treatment   (2) Pulm mod  dlco 66-80   (3) Pulm severe  dlco <65   (2) Renal  CR>2 (GFR 43)   (2) Rheum  SLE/RA   (3) Solid tumor X  CLL (active)   Other Co-morbid     Age 89 and  Greater X     Timed Up and Go Test: 8.48 seconds     6 Minute Walk Test   Distance walked: 347 meters (<471m)  Oxygen saturation pre-walk: 100    Oxygen saturation post-walk: 100  Cardiovascular   ECHO: EF 60-65%  EKG: Not yet performed  Cardiology consult: Yes/No  Pulmonary   PFTs: FVC-100.8%; FEV1-93.2%, DLCOc ([place])-85.68%  CXR: No airspace disease  Caregiver plan/Concerns   Primary:   Wilber Oliphant, son   Janese Radabaugh, son Taliyah Watrous, daughter   Madilyn Hook, sister-in-law  ??  Backup:  Alean Rinne, husband   April Reagan, friend    Social concerns: Multiple caregivers  Other concerns: None, well resourced.  Supportive husband.     Overall Assessment and Areas for Improvement:     Disease related deficits:  Currently experiencing  nausea that is fairly profound resulting in significant weight loss over last 3 months.  Unclear the exact etiology but suspected to be related to medications and could include either antibiotics or possibly exjade taken for iron overload.  Continues on promacta through local oncologist, Dr. [name].    Plan:  Discuss a trial of holding exjade to see if this will help nausea and allow better oral food intake.  Continue to follow with Dr. [name] for disease treatment and [facility] heme malignancy team.  Will discuss need for treatment of low level of CLL involvement with Dr. Oswald Hillock if pt is a candidate for transplant.      Concerns related to Comorbidities:   She reports around 60 units of PRBCs for lifetime leading to inevitable iron overload.  She also has a history of longstanding anxiety and depression that is managed by psychiatrist that she sees annually.  Stable on regimen of trintellix and lithium with xanax and ativan as needed.   Hypothyroid managed on synthroid.  Insomnia managed with ambien.    Plan: Will refer to [facility] as needed if proceeds with transplant for additional peri-transplant support      Current physical activity:   Assistive Devices: None  Debilitated from weakness that occurred primarily after administration of equine ATG, but persists and limits activity and lifestyle.  No exercise and difficulty with normal level of activity.  Previous to disease was active without restrictions.    Plan: Work to increase physical activity as close to goal of of cardiovascular activity such as walking 5x/wk as possible    Nutrition Assessment:   Current Diet: Poor current nutrition due to persistent nausea and decreased appetite with substantial weight loss over last 3 months, but likely not adequate nutrition for an even longer period.  Currently underweight.    Plan:  Work on nausea control and increasing caloric intake with emphasis on varied diet and protein intake.  Can try natural sources of probiotics to help with gi distress in form of yogurt and similar foods.    Susan Kane is now convinced that she would like to proceed to an allo-SCT. After ATG and now Promacta her blood counts improved just slightly as her ANC in now 0.3 and she no longer requires plt transfusions. However she still needs PRBC transfusions.   While the treatment modalities for CLL have not been exhausted, her marrow reserve cannot support further CLL therapy.   I believe, an allo-SCT is likely the best option at this point, if it could be performed. Unfortunately, at this point Susan Kane does not meet criteria to receive transplant.  - Her PS is poor: he is abl to perform her ADLs but unable to do much more than that.   - Her calculated cr clearance of 17 mL/min.   - The GI symptoms and weight loss seem serious and we would need to arrive with the diagnosis and treatment before we could proceed to transplant.     I discussed these concerns with the local oncologist. She will undergo a full GI evaluation.   I also asked for comprehensive psychiatry evaluation as the patient likely has a hx of bipolar disorder and might benefit from optimizing her mental health.   Cr may be improved after tapering tacrolimus. If it does not, patient will need a more comprehensive evaluation for renal insufficiency.   Last but not least patient needs intensive PT: she appears very deconditioned and her PS needs to improve. Her times  up and go test was almost 9 sec.     The patient will work on the above. We will meed in about 6 weeks to evaluate the progress and re-assess candidacy for transplant.      Lanae Boast, MD  Associate Professor  Hematology/Oncology BMTCTP

## 2019-09-10 ENCOUNTER — Encounter: Payer: Self-pay | Admitting: *Deleted

## 2019-09-10 ENCOUNTER — Other Ambulatory Visit: Payer: Self-pay | Admitting: Oncology

## 2019-09-10 LAB — TACROLIMUS LEVEL: Tacrolimus (FK506) - LabCorp: 7.8 ng/mL (ref 2.0–20.0)

## 2019-09-10 NOTE — Unmapped (Signed)
Recent background: On 12/15, Susan Kane was switched from cyclosporine to tacrolimus. This was mainly due to the limitations of nausea/vomiting on her levels and quality of life. If she were to get a transplant she would need to tolerate immunosuppression therapy, and so therefore the decision was made to switch her to tacrolimus.     She was started on 1 mg BID on 12/15. This dose was determined based off a target dose of 0.03 mg/kg BID with a 50% empiric dose reduction for the posaconazole interaction. She continues on 1 mg BID with no dose adjustments needed yet.    Patient's tacrolimus level from 2/15 came back 7.8 ng/mL (goal 5-15).  She should continue on tac 1 mg BID.  Her SCr is improved today to 1.1 but has been fluctuating. Her LFTs are WNL. CBC continues to show low counts (ANC 0.1, PLT 28, Hgb 9.6 - likely transfused recently). Mg has been low likely due to tac - may consider addition of a supplement. Of note, she is currently on Exjade 1000 mg daily and Promacta 150 mg daily.      She should remain on ppx with posaconazole indefinitely to avoid stopping/starting which impacts tacrolimus metabolism and levels. She should continue other ppx of levofloxacin while ANC < 0.5, and valtrex and Bactrim indefinitely. Should continue to get weekly tacrolimus troughs locally. I will continue to follow.       Manfred Arch, PharmD, BCOP, CPP  Pager: [phone]

## 2019-09-10 NOTE — Progress Notes (Unsigned)
Faxed labs from 09/08/19 including Tacrolimus level to [facility], att: Joellen Jersey. [phone]

## 2019-09-14 NOTE — Progress Notes (Addendum)
Meagan Walsh   [phone] [phone]   Clinic Follow up Note   Patient Care Team: Lavone Orn, MD as PCP - General (Internal Medicine) Linda Hedges, DO as Consulting Physician (Obstetrics and Gynecology) Clovis Pu, Billey Co., MD as Attending Physician (Psychiatry) 09/15/2019  CHIEF COMPLAINT: F/u CLL, Aplastic anemia   INTERVAL HISTORY: Meagan Walsh returns for f/u as scheduled. She saw [facility] last week. Her weakness is "tiny bit better." She started having a personal trainer who plans to come 3 times per week. Also friends started meal delivery plan. She vomited twice last week but none in the last few days since stopping levaquin. Continues to have diarrhea 2-3 times daily but not a large amount. Vaginal bleeding persists, no other bleeding. Denies fever, chills, cough, chest pain, dyspnea.    MEDICAL HISTORY:  Past Medical History:  Diagnosis Date  . Anxiety   . Cancer ([place])    CLL  . Degenerative disk disease    Neck--chronic pain  . Depression   . Fibroids    Uterine  . 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 Hypothyroidism   . Lymphocytosis   . Mitral valve prolapse   . Mitral valve prolapse   . Thyroid disease    Hypothyroid    SURGICAL HISTORY: Past Surgical History:  Procedure Laterality Date  . BREAST BIOPSY Left 2000  . CESAREAN SECTION  1996  . MELANOMA EXCISION WITH SENTINEL LYMPH NODE BIOPSY Left 10/01/2017   Procedure: EXCISIONAL BIOPSY OF LEFT INGUINAL LYMPH NODE;  Surgeon: Greer Pickerel, MD;  Location: [facility];  Service: General;  Laterality: Left;  . REVISION OF ABDOMINAL SCAR    . TONSILLECTOMY     age 47    I have reviewed the social history and family history with the patient and they are unchanged from previous note.  ALLERGIES:  has No Known Allergies.  MEDICATIONS:  Current Outpatient Medications  Medication Sig Dispense Refill  . acetaminophen (TYLENOL) 325 MG tablet Take 650 mg  by mouth every 6 (six) hours as needed.    . ALPRAZolam (XANAX) 0.25 MG tablet TAKE 1 TO 2 TABLETS BY MOUTH EVERY DAY AS NEEDED FOR ANXIETY 30 tablet 3  . cyclobenzaprine (FLEXERIL) 10 MG tablet Take 1 tablet (10 mg total) by mouth 2 (two) times daily as needed for muscle spasms. Do NOT take with xanax or ambien 30 tablet 0  . deferasirox (EXJADE) 500 MG disintegrating tablet Take 2 tablets (1,000 mg total) by mouth daily before breakfast. Place tablets in water, orange/apple juice, avoid milk. Stir to form suspension and drink (Patient taking differently: Take 500 mg by mouth daily before breakfast. Place tablets in water, orange/apple juice, avoid milk. Stir to form suspension and drink) 60 tablet 2  . diazepam (VALIUM) 5 MG tablet Take #1 tablet 1 hour prior to MRI scan--DO NOT DRIVE 1 tablet 0  . eltrombopag (PROMACTA) 50 MG tablet Take 3 tablets (150 mg total) by mouth daily. Take on an empty stomach 1 hour before a meal or 2 hours after (Patient taking differently: Take 150 mg by mouth daily. Take on an empty stomach 1 hour before a meal or 2 hours after  Pt takes 2 in AM and 1 after dinner) 90 tablet 2  . folic acid (FOLVITE) 1 MG tablet TAKE 1 TABLET BY MOUTH EVERY DAY 90 tablet 0  . levofloxacin (LEVAQUIN) 500 MG tablet Take 1 tablet (500 mg total) by  mouth daily. 30 tablet 0  . levonorgestrel (MIRENA) 20 MCG/24HR IUD 1 each by Intrauterine route once.     [name] levothyroxine (SYNTHROID) 100 MCG tablet TAKE 1 TABLET BY MOUTH EVERY DAY 30 tablet 2  . lithium carbonate (LITHOBID) 300 MG CR tablet TAKE 2 TABLETS BY MOUTH EVERY DAY AT BEDTIME 180 tablet 1  . LORazepam (ATIVAN) 0.5 MG tablet Take 1 tablet (0.5 mg total) by mouth 2 (two) times daily as needed (prior to tacrolimus dose if needed). 60 tablet 0  . ondansetron (ZOFRAN-ODT) 8 MG disintegrating tablet DISSOLVE 1 TABLET EVERY 8 HOURS AS NEEDED FOR NAUSEA OR VOMITING 30 tablet 1  . oxyCODONE-acetaminophen (PERCOCET/ROXICET) 5-325 MG tablet  Take 1 tablet by mouth every 8 (eight) hours as needed for moderate pain or severe pain. 30 tablet 0  . posaconazole (NOXAFIL) 100 MG TBEC delayed-release tablet TAKE 3 TABLETS BY MOUTH DAILY 90 tablet 1  . sulfamethoxazole-trimethoprim (BACTRIM DS) 800-160 MG tablet Take 1 tablet by mouth 2 (two) times a week. 16 tablet 5  . SUMAtriptan (IMITREX) 20 MG/ACT nasal spray Place 1 spray (20 mg total) into the nose daily as needed for migraine or headache. 12 Inhaler 6  . tacrolimus (PROGRAF) 1 MG capsule Take 1 mg by mouth 2 (two) times daily.     . TRINTELLIX 20 MG TABS tablet TAKE 1 TABLET BY MOUTH EVERY DAY 90 tablet 0  . valACYclovir (VALTREX) 500 MG tablet TAKE 1 TABLET BY MOUTH TWICE A DAY 180 tablet 1  . zolpidem (AMBIEN) 10 MG tablet TAKE 1/2 TO 1 TABLET BY MOUTH AT BEDTIME AS NEEDED FOR INSOMNIA. 30 tablet 2  . prochlorperazine (COMPAZINE) 5 MG tablet Take 1-2 tablets (5-10 mg total) by mouth every 6 (six) hours as needed for up to 3 days for nausea or vomiting. 60 tablet 1   No current facility-administered medications for this visit.   Facility-Administered Medications Ordered in Other Visits  Medication Dose Route Frequency Provider Last Rate Last Admin  . 0.9 %  sodium chloride infusion (Manually program via Guardrails IV Fluids)  250 mL Intravenous Once Betsy Coder B, MD      . 0.9 %  sodium chloride infusion (Manually program via Guardrails IV Fluids)  250 mL Intravenous Once Ladell Pier, MD      . famotidine (PEPCID) IVPB 20 mg premix  20 mg Intravenous Once Betsy Coder B, MD      . heparin lock flush 100 unit/mL  250 Units Intracatheter PRN Betsy Coder B, MD      . sodium chloride flush (NS) 0.9 % injection 10 mL  10 mL Intracatheter PRN Ladell Pier, MD   10 mL at 02/21/19 0945  . sodium chloride flush (NS) 0.9 % injection 10 mL  10 mL Intracatheter PRN Betsy Coder B, MD      . sodium chloride flush (NS) 0.9 % injection 10 mL  10 mL Intravenous PRN Ladell Pier, MD   10 mL at 09/15/19 1244  . SUMAtriptan (IMITREX) nasal spray 20 mg  20 mg Nasal Once Ladell Pier, MD        PHYSICAL EXAMINATION:  Vitals:   09/15/19 1112 09/15/19 1113  BP: (!) 91/59 (!) 81/50  Pulse: 100 (!) 105  Resp: 18 18  Temp:    SpO2: 100% 100%   Filed Weights   09/15/19 1040  Weight: 115 lb 1.6 oz (52.2 kg)    GENERAL:alert, no distress and comfortable SKIN: no  rash  EYES:  sclera clear OROPHARYNX: mild gingival hypertrophy with erythema, no bleeding  NECK: without mass LYMPH:  no palpable cervical, axillary, or inguinal lymphadenopathy LUNGS: clear with normal breathing effort HEART: regular rate & rhythm, no lower extremity edema ABDOMEN: abdomen soft, non-tender and normal bowel sounds NEURO: alert & oriented x 3 with fluent speech, no focal motor/sensory deficits PICC site c/d/i   LABORATORY DATA:  I have reviewed the data as listed CBC Latest Ref Rng & Units 09/15/2019 09/08/2019 09/01/2019  WBC 4.0 - 10.5 K/uL 3.0(L) 3.7(L) 2.6(L)  Hemoglobin 12.0 - 15.0 g/dL 8.9(L) 9.6(L) 7.2(L)  Hematocrit 36.0 - 46.0 % 27.0(L) 28.3(L) 21.3(L)  Platelets 150 - 400 K/uL 36(L) 28(L) 33(L)     CMP Latest Ref Rng & Units 09/15/2019 09/08/2019 09/01/2019  Glucose 70 - 99 mg/dL 194(H) 131(H) 131(H)  BUN 6 - 20 mg/dL 21(H) 20 17  Creatinine 0.44 - 1.00 mg/dL 1.42(H) 1.10(H) 1.63(H)  Sodium 135 - 145 mmol/L 140 140 139  Potassium 3.5 - 5.1 mmol/L 3.7 3.8 3.8  Chloride 98 - 111 mmol/L 108 108 107  CO2 22 - 32 mmol/L 21(L) 24 23  Calcium 8.9 - 10.3 mg/dL 9.7 9.4 9.6  Total Protein 6.5 - 8.1 g/dL 5.9(L) 5.7(L) 5.7(L)  Total Bilirubin 0.3 - 1.2 mg/dL 0.4 0.5 0.4  Alkaline Phos 38 - 126 U/L 107 110 114  AST 15 - 41 U/L 12(L) 11(L) 10(L)  ALT 0 - 44 U/L _0 RADIOGRAPHIC STUDIES: I have personally reviewed the radiological images as listed and agreed with the findings in the report. No results found.   ASSESSMENT & PLAN:   1.CLL-diagnosed in  August 2010, flow cytometry consistent with CLL Enlarged leftinguinal lymph node January 2019,smallneck/axillary nodes and palpable splenomegaly 09/11/2017  CTson 09/17/2017-3 cm necrotic appearing lymph node in the left inguinal region, borderline enlarged pelvic/retroperitoneal, chest, and axillary nodes. Mild splenomegaly.  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, no evidence of 11q22deletion  Bone marrow biopsy 02/26/2018-hypercellular marrow with extensive involvement by CLL, lymphocytes represent85% of all cells  Ibrutinib initiated 04/03/2018  Ibrutinib placed on hold 04/11/2018 due to onset of arthralgias  Ibrutinib resumed 04/16/2018, discontinued 04/25/2018 secondary to severe arthralgias/arthritis  Ibrutinib resumed at a dose of 140 mg daily 05/03/2018  Ibrutinib dose adjusted to 140 mg alternating with 291m 06/25/2018  Ibrutinib discontinued 07/03/2018 secondary to severe arthralgias  Acalabrutinib 08/16/2018, discontinued 11/15/2018 secondary to persistent severe transfusion dependent anemia and neutropenia/thrombocytopenia, last dose 11/14/2018  Bone marrow biopsy 11/21/2018-decreased cellularity, involvement by CLL, decreased erythroid and granulocytic precursors, decreased megakaryocytes  Cycle 1 rituximab 12/06/2018  Bone marrow biopsy 12/24/2018 at [facility]-hypocellular bone marrow (10%) involved by CLL, representing 50% of marrow  cellularity; markedly decreased trilineage hematopoiesis including essentially absent erythropoiesis  Bone marrow biopsy 03/06/2027 [facility]-hypocellular bone marrow (20%) with marked reduction of maturing hematopoietic elements; numerous lymphoid aggregates consistent with involvement by CLL, representing approximately 50% of marrow cellularity by flow cytometry  [facility] by [facility]nisone taper  Eltrombopag beginning 07/05/2019  Cyclosporine  discontinued 07/08/2019  Tacrolimus 07/10/2019  2.Hypothyroidism 3.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 Augmentin prescribed. 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 Pancytopenia secondary to CLL and a hypocellular bone marrow  G-CSF and Nplate started 6/72/0947, G-CSF changed to daily beginning 12/17/2018; G-CSF discontinued 12/31/2018, last Nplate 01/15/2019  Began prednisone 60 mg daily 01/11/19, tolerating moderately well except jitteriness, irritability, and difficulty sleeping. Tapered to 40 mg daily x1 week starting 01/24/19, reduce by 10 mg each week until discontinued. Prednisone discontinued 02/20/2019.  Promacta started 01/20/2019, dose increased to 100 mg daily 02/04/2019; dose increased to 150 mg daily 02/21/2019  IVIG daily for 2 days beginning 02/25/2019  9. Severe headache and nausea/vomiting 02/27/2019-likely related to IVIG therapy,resolved 10.Intermittent nausea and vomiting following cyclosporine dosing 11.Gingival hypertrophy secondary to cyclosporine-improved 12.MRI liver 06/12/2019-iron  overload in liver and spleen. No lymphadenopathy or splenomegaly. Exjade beginning 06/28/2019. 13. Elevated creatinine-cyclosporine toxicity? Persistent 14.Vaginal bleeding beginning 07/13/2019-Mirena device removed 15.Elevated lithium level 08/08/2019, lithium toxicity?-Lithium held 08/08/2019-08/12/2019, then resumed at a lower dose 16. Deconditioning, secondary to n/v/d 17. Orthostatic hypotension 09/15/19, receiving 500 cc NS in clinic  Disposition:  Meagan Walsh appears stable. Her weakness has improved slightly, she will continue with personal trainer 3 times per week to improve overall PS. She has persistent pancytopenia, ANC 0.1, Hg 8.9, PLT 36, she does not require transfusions today (last plt transfusion on 07/12/2019, last RBC transfusion on 09/01/19). Mg normal. Lithium level is therapeutic. [place] level is pending from today.   Her nausea/vomiting has improved since holding levaquin over the weekend, will continue holding for now. We will contact [facility] to determine a replacement for prophylaxis. She has persistent diarrhea. A GI consult is pending. We encouraged her to remain hydrated and work on her nutrition. She has mild orthostatic hypotension and decreased renal function today, she is likely mildly dehydrated. We will support with 500 cc NS and she will increase oral hydation.   She has persistent vaginal bleeding which I again encouraged her to call Ob/Gyn to evaluate. She agrees. We also reviewed it is OK to get a gentle dental cleaning.   She will return for repeat CBC on 2/25, she will return for labs and office visit on 09/22/19. She will f/u with [facility] in 5-6 weeks. Meagan Walsh was seen with Dr. Benay Spice today.      No problem-specific Assessment & Plan notes found for this encounter.   Orders Placed This Encounter  Procedures  . CBC with Differential (Cancer Center Only)    Standing Status:   Future    Standing Expiration Date:   09/14/2020  . Sample to Blood Bank    Standing  Status:   Future    Standing Expiration Date:   09/14/2020   All questions were answered. The patient knows to call the clinic with any problems, questions or concerns. No barriers to learning was detected.     Alla Feeling, NP 09/15/19  This was a shared visit with Cira Rue.  Ms. Bevill appears unchanged.  Nausea has improved since discontinuing Levaquin 09/12/2019.  She will remain off Levaquin.  We will contact the [facility] transplant service to discuss alternative prophylactic antibiotic. She received IV fluids today as she may be  dehydrated.  We encouraged her to increase her oral intake as tolerated.  She plans to begin an exercise program.  She will schedule an appointment with Dr. Clovis Pu.  She will see gastroenterology to evaluate the nausea and diarrhea.  I discussed the case with her husband by telephone on 09/12/2019.  I discussed the case with Dr.Jamieson on 09/09/2019.  [name], MD

## 2019-09-15 ENCOUNTER — Inpatient Hospital Stay: Payer: BC Managed Care – PPO

## 2019-09-15 ENCOUNTER — Telehealth: Payer: Self-pay

## 2019-09-15 ENCOUNTER — Inpatient Hospital Stay (HOSPITAL_BASED_OUTPATIENT_CLINIC_OR_DEPARTMENT_OTHER): Payer: BC Managed Care – PPO | Admitting: Nurse Practitioner

## 2019-09-15 ENCOUNTER — Encounter: Payer: Self-pay | Admitting: Internal Medicine

## 2019-09-15 ENCOUNTER — Other Ambulatory Visit: Payer: Self-pay

## 2019-09-15 ENCOUNTER — Encounter: Payer: Self-pay | Admitting: Nurse Practitioner

## 2019-09-15 VITALS — BP 81/50 | HR 105 | Temp 97.6°F | Resp 18 | Ht 66.0 in | Wt 115.1 lb

## 2019-09-15 DIAGNOSIS — D801 Nonfamilial hypogammaglobulinemia: Secondary | ICD-10-CM | POA: Diagnosis not present

## 2019-09-15 DIAGNOSIS — M1990 Unspecified osteoarthritis, unspecified site: Secondary | ICD-10-CM | POA: Diagnosis not present

## 2019-09-15 DIAGNOSIS — M25511 Pain in right shoulder: Secondary | ICD-10-CM | POA: Diagnosis not present

## 2019-09-15 DIAGNOSIS — C9110 Chronic lymphocytic leukemia of B-cell type not having achieved remission: Secondary | ICD-10-CM

## 2019-09-15 DIAGNOSIS — R112 Nausea with vomiting, unspecified: Secondary | ICD-10-CM | POA: Diagnosis not present

## 2019-09-15 DIAGNOSIS — Z95828 Presence of other vascular implants and grafts: Secondary | ICD-10-CM

## 2019-09-15 DIAGNOSIS — E039 Hypothyroidism, unspecified: Secondary | ICD-10-CM | POA: Diagnosis not present

## 2019-09-15 DIAGNOSIS — J189 Pneumonia, unspecified organism: Secondary | ICD-10-CM | POA: Diagnosis not present

## 2019-09-15 DIAGNOSIS — R519 Headache, unspecified: Secondary | ICD-10-CM | POA: Diagnosis not present

## 2019-09-15 DIAGNOSIS — D61818 Other pancytopenia: Secondary | ICD-10-CM | POA: Diagnosis not present

## 2019-09-15 DIAGNOSIS — R251 Tremor, unspecified: Secondary | ICD-10-CM | POA: Diagnosis not present

## 2019-09-15 DIAGNOSIS — Z79899 Other long term (current) drug therapy: Secondary | ICD-10-CM | POA: Diagnosis not present

## 2019-09-15 LAB — CBC WITH DIFFERENTIAL (CANCER CENTER ONLY)
Abs Immature Granulocytes: 0 10*3/uL (ref 0.00–0.07)
Basophils Absolute: 0 10*3/uL (ref 0.0–0.1)
Basophils Relative: 0 %
Eosinophils Absolute: 0 10*3/uL (ref 0.0–0.5)
Eosinophils Relative: 1 %
HCT: 27 % — ABNORMAL LOW (ref 36.0–46.0)
Hemoglobin: 8.9 g/dL — ABNORMAL LOW (ref 12.0–15.0)
Immature Granulocytes: 0 %
Lymphocytes Relative: 90 %
Lymphs Abs: 2.7 10*3/uL (ref 0.7–4.0)
MCH: 29.6 pg (ref 26.0–34.0)
MCHC: 33 g/dL (ref 30.0–36.0)
MCV: 89.7 fL (ref 80.0–100.0)
Monocytes Absolute: 0.2 10*3/uL (ref 0.1–1.0)
Monocytes Relative: 6 %
Neutro Abs: 0.1 10*3/uL — CL (ref 1.7–7.7)
Neutrophils Relative %: 3 %
Platelet Count: 36 10*3/uL — ABNORMAL LOW (ref 150–400)
RBC: 3.01 MIL/uL — ABNORMAL LOW (ref 3.87–5.11)
RDW: 15.3 % (ref 11.5–15.5)
WBC Count: 3 10*3/uL — ABNORMAL LOW (ref 4.0–10.5)
nRBC: 0 % (ref 0.0–0.2)

## 2019-09-15 LAB — CMP (CANCER CENTER ONLY)
ALT: 16 U/L (ref 0–44)
AST: 12 U/L — ABNORMAL LOW (ref 15–41)
Albumin: 3.9 g/dL (ref 3.5–5.0)
Alkaline Phosphatase: 107 U/L (ref 38–126)
Anion gap: 11 (ref 5–15)
BUN: 21 mg/dL — ABNORMAL HIGH (ref 6–20)
CO2: 21 mmol/L — ABNORMAL LOW (ref 22–32)
Calcium: 9.7 mg/dL (ref 8.9–10.3)
Chloride: 108 mmol/L (ref 98–111)
Creatinine: 1.42 mg/dL — ABNORMAL HIGH (ref 0.44–1.00)
GFR, Est AFR Am: 46 mL/min — ABNORMAL LOW (ref >60)
GFR, Estimated: 40 mL/min — ABNORMAL LOW (ref >60)
Glucose, Bld: 194 mg/dL — ABNORMAL HIGH (ref 70–99)
Potassium: 3.7 mmol/L (ref 3.5–5.1)
Sodium: 140 mmol/L (ref 135–145)
Total Bilirubin: 0.4 mg/dL (ref 0.3–1.2)
Total Protein: 5.9 g/dL — ABNORMAL LOW (ref 6.5–8.1)

## 2019-09-15 LAB — SAMPLE TO BLOOD BANK

## 2019-09-15 LAB — MAGNESIUM: Magnesium: 1.7 mg/dL (ref 1.7–2.4)

## 2019-09-15 LAB — LITHIUM LEVEL: Lithium Lvl: 0.98 mmol/L (ref 0.60–1.20)

## 2019-09-15 MED ORDER — SODIUM CHLORIDE 0.9 % IV SOLN
Freq: Once | INTRAVENOUS | Status: AC
  Filled 2019-09-15: qty 250

## 2019-09-15 MED ORDER — SODIUM CHLORIDE 0.9% FLUSH
10.0000 mL | INTRAVENOUS | Status: DC | PRN
  Administered 2019-09-15: 10 mL via INTRAVENOUS
  Filled 2019-09-15: qty 10

## 2019-09-15 MED ORDER — HEPARIN SOD (PORK) LOCK FLUSH 100 UNIT/ML IV SOLN
500.0000 [IU] | Freq: Once | INTRAVENOUS | Status: AC
  Administered 2019-09-15: 500 [IU] via INTRAVENOUS
  Filled 2019-09-15: qty 5

## 2019-09-15 NOTE — Telephone Encounter (Signed)
-----   Message from Milus Banister, MD sent at 09/13/2019  6:14 AM EST ----- Leroy Sea, we wil get her in.  Thanks  Fama Muenchow, This woman needs first available apt with any provider (MD or extender) for nausea/diarrhea. She has CLL and is probably going to be getting a bone marrow transplant soon. If no provider openings  within 1-2 weeks, please double book with me.  Thanks  ----- Message ----- From: Owens Shark, NP Sent: 09/12/2019   2:31 PM EST To: Milus Banister, MD  This is the patient Dr Benay Spice discussed with you.  Thanks, Leander Rams

## 2019-09-15 NOTE — Telephone Encounter (Signed)
Meagan Walsh, can you get her scheduled for me? Thank you

## 2019-09-15 NOTE — Telephone Encounter (Signed)
Spoke to Ms [place] and scheduled her for 09-17-19 w/Dr Carlean Purl.

## 2019-09-15 NOTE — Patient Instructions (Signed)

## 2019-09-16 ENCOUNTER — Encounter
Admit: 2019-09-16 | Discharge: 2019-09-16 | Payer: PRIVATE HEALTH INSURANCE | Attending: Adult Health | Primary: Adult Health

## 2019-09-16 ENCOUNTER — Non-Acute Institutional Stay: Admit: 2019-09-16 | Discharge: 2019-09-16 | Payer: PRIVATE HEALTH INSURANCE

## 2019-09-16 ENCOUNTER — Telehealth: Payer: Self-pay | Admitting: *Deleted

## 2019-09-16 ENCOUNTER — Telehealth: Payer: Self-pay | Admitting: Nurse Practitioner

## 2019-09-16 DIAGNOSIS — T50905A Adverse effect of unspecified drugs, medicaments and biological substances, initial encounter: Secondary | ICD-10-CM | POA: Diagnosis not present

## 2019-09-16 DIAGNOSIS — D619 Aplastic anemia, unspecified: Secondary | ICD-10-CM | POA: Diagnosis not present

## 2019-09-16 DIAGNOSIS — C9110 Chronic lymphocytic leukemia of B-cell type not having achieved remission: Secondary | ICD-10-CM | POA: Diagnosis not present

## 2019-09-16 DIAGNOSIS — R112 Nausea with vomiting, unspecified: Secondary | ICD-10-CM | POA: Diagnosis not present

## 2019-09-16 LAB — TACROLIMUS LEVEL: Tacrolimus (FK506) - LabCorp: 7.5 ng/mL (ref 2.0–20.0)

## 2019-09-16 MED ORDER — CEFDINIR 300 MG PO CAPS: 600.0000 mg | ORAL_CAPSULE | Freq: Every day | ORAL | 2 refills | Status: DC

## 2019-09-16 NOTE — Unmapped (Signed)
[facility] Triage Note     Patient: Susan Kane     Reason for call:  return call    Time call returned: 0836     Phone Assessment: pt calling to ask if she can change her visit to a virtual visit this morning.  She had labs drawn yesterday at [facility] and results have been pulled into her care everywhere.           Triage Recommendations:      RN consulted with [name], NP who will be seeing the pt today and he has agreed to a virtual appt.  Pt asked that the phone number [phone] be used for the appt.

## 2019-09-16 NOTE — Unmapped (Signed)
Hi,     Patient contacted the Communication Center requesting to speak with the care team of Susan Kane to discuss:    Patient seems unsure of if her appointments for today (abs and S.Gallagher) are needed    Please contact Thersa Salt at [phone].    Check Indicates criteria has been reviewed and confirmed with the patient:    [x]  Preferred Name   [x]  DOB and/or MR#  [x]  Preferred Contact Method  [x]  Phone Number(s)   []  MyChart     Thank you,   Jannette Spanner  [facility]   [phone]

## 2019-09-16 NOTE — Unmapped (Signed)
Hi,     Patient contacted the Communication Center regarding the following:    - Would like to know if 09/16/19 appointment can be converted to a virtual visit. Patient says she had labs drawn locally yesterday and wants to know if those results can be used.    Please contact patient at [phone].    Thanks in advance,    Susan Kane  [facility]   [phone]

## 2019-09-16 NOTE — Unmapped (Signed)
Reviewed Meds and Allergies, confirmed pharmacy and informed how to use Doximity.

## 2019-09-16 NOTE — Unmapped (Signed)
[facility] Follow-up: virtual visit    I spent 20 minutes on the video with the patient on the date of service. I spent an additional 15 minutes on pre- and post-visit activities.     The patient was physically located in [place] or a state in which I am permitted to provide care. The patient and/or parent/guardian understood that s/he may incur co-pays and cost sharing, and agreed to the telemedicine visit. The visit was reasonable and appropriate under the circumstances given the patient's presentation at the time.    The patient and/or parent/guardian has been advised of the potential risks and limitations of this mode of treatment (including, but not limited to, the absence of in-person examination) and has agreed to be treated using telemedicine. The patient's/patient's family's questions regarding telemedicine have been answered.     If the visit was completed in an ambulatory setting, the patient and/or parent/guardian has also been advised to contact their provider???s office for worsening conditions, and seek emergency medical treatment and/or call 911 if the patient deems either necessary.      Patient Name: Susan Kane  Patient Age: 59 y.o.  Encounter Date: 09/16/2019    Primary Care Provider:  Thora Lance, MD    Referring Physician:  Lillia Mountain, MD  [address]  [address]  [place],  [place] [address]    Chief complaint: Here for follow up of : CLL now with aplastic anemia (possibly 2/t acalabrutinib)    Assessment:  Susan Kane is a 59 y.o. year old female with history of CLL initially treated with ibrutinib. Unfortunately she was not able to tolerate this due to severe arthralgias. Therapy was changed to acalabrutinib, which was much better tolerated with no recurrence of arthralgias, but with progressive bone marrow failure as evidenced by ongoing transfusion dependence, worsening thrombocytopenia and neutropenia with marrow cellularity 5% (when not counting CLL cells).  ??  Reviewed pt's case with Dr. [name] and also presented at our multidisciplinary hem malignancy conference on multiple occasions - while not typical, her course was concerning for development of aplastic anemia possibly due to the acalabrutinib.     Acalabrutinib was discontinued and she was placed on Promacta as well as corticosteroids in addition to a x1 dose of Rituximab. This resulted in a brief but not durable response.     BMBx was performed on 8/14 which demonstrated  A hypocellular bone marrow (20%) with marked reduction of maturing hematopoietic elements. Numerous lymphoid aggregates, consistent with involvement by chronic lymphocytic leukemia, representing ~50% of marrow cellularity by flow cytometry. No clonal abnormalities to suggest MDS.    Proceeded with ATG-cyclosporine (8/26=Day 1) and she tolerated the ATG with just a mild reaction on day 1.    However she has experienced significant n/v while on cyclosporine and her levels have been intermittently subtherapeutic despite dose adjustments by our pharmacist. In addition, she remains quite neutropenic and thrombocytopenic. Changed to tacrolimus as a result (and also because would be helpful to know she can tolerate this drug if we go BMT route) and pt is doing much better from a N/V perspective. Also added back eltrombopag on 12/7 in event the triplet is helpful for her (had not added given lack of prior response to it as monotherapy).    Plts up a bit since starting the eltrombopag back up but ANC remains in the very severe range, ~0.1-0.2 on past several checks. As she is 5 months post ATG  I noted my enthusiasm for seeing improvement from this regimen gets lower and lower and that I am leaning toward BMT as her likely best long term bet.    BMT provider noted may be good to get CLL at lower level pre-BMT. Once get a date for BMT may coordinate with Dr. [name] vs here to do venetoclax to debulk remaining CLL. Would do CT prior to make sure no bulky internal nodes and if not likely would be low TLS risk and could do ramp up outpt (provided renal function remains in reasonably acceptable level).    Pt is now attempting to improve her performance status for transplant.  - Working with a Systems analyst is reasonable.  - GI evaluation - meeting with GI tomorrow to evaluate for causes for persistent GI complaints  - In interim, reasonable to switch to cefdinir from levofloxacin since the GI complaints seem to be much better off the levofloxacin  - monitor SCr which has been moderately elevated        Plan and Recommendations:   1. Aplastic Anemia  - cont tacrolimus, weekly levels  - cont posaconazole 300mg  daily - plan to continue even if ANC >0.5 with goal of maintaining cyclosporine level stability  - HOLD levofloxacin ppx while ANC <0.5 due to potential GI intolerance - trial cefdinir  - Continue bactrim and valtrex (usually on for about a year after ATG, can check CD4 level in several months to help determine when ok to stop)  - defer to PharmD on tacro monitoring  - appreciate BMT involvement, sending note to Dr Oswald Hillock for awareness/input  - rtc in 4 weeks    2. Iron overload  - exjade for iron overload     3. CLL  - hold CLL directed therapy but as above, have discussed may re-initiate prior to BMT to debulk remaining disease leading up to BMT if we go BMT route (would likely use venetoclax but this will likely markedly worsen her counts so no rush to start)    4. Nausea: improved on tacrolimus but still an issue, prn and scheduled meds as prior  - GI evaluation - appointment 2/24    5. AKI  - appears stable    Discussed with Drs. Sheets and Malen Gauze, covering for Dr. Aris Lot, AGPCNP-BC  Nurse Practitioner  Hematology/Oncology  [facility]      09/16/19    History of Present Illness:  Oncology History Overview Note   CLL:    Summary as per Dr. Kalman Drape most recent note with minor edits/additions from my review of records    1.??CLL?????diagnosed in August 2010, flow cytometry consistent with CLL  ?? Enlarged left??inguinal lymph node January 2019,??small??neck/axillary nodes and palpable splenomegaly 09/11/2017  ?? CTs??on 09/17/2017-3 cm necrotic appearing lymph node in the left inguinal region, borderline enlarged pelvic/retroperitoneal, chest, and axillary nodes. ??Mild splenomegaly.  ?? Ultrasound-guided biopsy of the left inguinal lymph node 09/18/2017, slightly purulent fluid aspirated, core biopsy is consistent with an atypical lymphoid proliferation???extensive necrosis with surrounding epithelioid histiocytes, limited intact lymphoid tissue involved with CLL  ?? Incisional biopsy of a necrotic/purulent left inguinal lymph node on 10/01/2017???extensive necrosis with granulomatous inflammation, small amount of viable lymphoid tissue involved with CLL, AFB and fungal stains negative  ?? Peripheral blood FISH analysis 02/05/2018??? +deletion 13q14, no evidence of p53 (17p13) deletion, no evidence of 11q22??deletion  - normal karyotype (46,XX)  ?? Bone marrow biopsy 02/26/2018???hypercellular marrow with extensive involvement by CLL, lymphocytes represent??85%  of all cells  ?? Ibrutinib initiated 04/03/2018  ?? Ibrutinib placed on hold 04/11/2018 due to onset of arthralgias  ?? Ibrutinib resumed 04/16/2018, discontinued 04/25/2018 secondary to severe arthralgias/arthritis  ?? Ibrutinib resumed at a dose of 140 mg daily 05/03/2018  ?? Ibrutinib dose adjusted to 140 mg alternating with 280 mg 06/25/2018  ?? 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 ??Augmentin prescribed.  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    Baseline BM bx reviewed at [facility]  Final Diagnosis   (Outside Case #:  [id number], dated 02/26/2018)  Bone marrow, aspiration and biopsy  -  Hypercellular bone marrow (80%) with extensive involvement by chronic lymphocytic leukemia (87% lymphocytes by manual aspirate differential)  (See Comment)  -  By outside report, cytogenetic results are normal     Karyotype: 64, XX and FISH with 13q del but no 11q or 17p del    Repeat BM bx: (done after being off ibrutinib x1 month)  80% involvement by CLL    08/13/18: Presents to [facility] to discuss plan of care     Chronic lymphocytic leukemia (CLL), B-cell (CMS-HCC)   07/12/2018 Initial Diagnosis    Chronic lymphocytic leukemia (CLL), B-cell (CMS-HCC)     08/26/2018 -  Chemotherapy    Acalabrutinib started (approximate date)     11/14/2018 -  Chemotherapy    Acalabrutinib discontinued due to progressive bone marrow failure     12/06/2018 -  Chemotherapy    Ritux x1 given due to CLL still making up majority of BM cellularity (though in the setting of near aplasia of rest of TLH)     03/19/2019 -  Chemotherapy    Equine ATG / CsA for aplastic anemia (suspect 2/t acalabrutinib)         Interval History:  Since her last visit Susan Kane saw Dr. Oswald Hillock to discuss possible SCT. Dr. Oswald Hillock explained to the patient that alloSCT would be appropriate for the patient but she would need to be in better condition for transplant and asked her to work on that before another visit in March.  Since then, Susan Kane reports  - started working with a Pharmacist, hospital and is going to walk with a friend each day.  - arranged to see a GI MD tomorrow for evaluation of her persistent nausea  - arranged for friends to bring food to assist her and her husband in food preparation - with the goal of improved nutrition    She reports this past weekend went well. She reports her GI issues have been much better since she stopped the levofloxacin.    She asks if she should get the COVID vaccine when it is available.      Otherwise, she denies new constitutional symptoms such as night sweats or unexplained fevers.  Furthermore, she denies recurrent or unexplained intercurrent infections, dyspnea on exertion, lightheadedness, palpitations or chest pain.  There have been no new or unexplained pains or self-identified masses, swelling or enlarged lymph nodes.    Past Medical, Surgical and Family History were reviewed and pertinent updates were made in the Electronic Medical Record    Review of Systems:  Other than as reported above in the interim history, the balance of a full  12-system review was performed and unremarkable.    ECOG Performance Status: 1    Past Medical History:  Past Medical History:   Diagnosis Date   ??? Anxiety    ??? Depression    ??? Hypothyroid        Medications:    Current Outpatient Medications   Medication Sig Dispense Refill   ??? acetaminophen (TYLENOL) 325 MG tablet Take 650 mg by mouth every six (6) hours as needed for pain.     ??? ALPRAZolam (XANAX) 0.25 MG tablet 0.25 mg daily as needed.      ??? cefdinir (OMNICEF) 300 MG capsule Take 300 mg by mouth daily.     ??? deferasirox (EXJADE) 500 MG disintegrating tablet Take 1,000 mg by mouth.     ??? eltrombopag (PROMACTA) 50 MG tablet Take 150 mg by mouth.     ??? folic acid (FOLVITE) 1 MG tablet Take 1 mg by mouth daily.      ??? levothyroxine (SYNTHROID) 100 MCG tablet Take 100 mcg by mouth daily.      ??? lithium (LITHOBID) 300 MG ER tablet Take 300 mg by mouth at bedtime.      ??? LORazepam (ATIVAN) 0.5 MG tablet Take 1 mg by mouth nightly as needed (sleep and nausea).     ??? ondansetron (ZOFRAN-ODT) 8 MG disintegrating tablet Take 8 mg by mouth every eight (8) hours as needed for nausea.     ??? posaconazole (NOXAFIL) 100 mg TbEC delayed released tablet Take 300 mg by mouth daily. 90 tablet 2   ??? sulfamethoxazole-trimethoprim (BACTRIM DS) 800-160 mg per tablet TAKE 1 TABLET BY MOUTH TWICE A DAY ON SATURDAY AND SUNDAY     ??? tacrolimus (PROGRAF) 1 MG capsule Take 1 capsule (1 mg total) by mouth two (2) times a day. 60 capsule 5   ??? TRINTELLIX 20 mg tablet Take 20 mg by mouth daily.      ??? valACYclovir (VALTREX) 500 MG tablet Take 500 mg by mouth daily.      ??? zolpidem (AMBIEN) 10 mg tablet Take 10 mg by mouth nightly.        No current facility-administered medications for this visit.        Vital Signs:  BSA: There is no height or weight on file to calculate BSA.  There were no vitals filed for this visit.         Physical Exam:  There were no vitals filed for this visit.    LIMITED EXAM DUE TO ATTEMPT TO LIMIT CONTACT DUE TO CORONAVIRUS IN THE COMMUNITY     GENERAL:  The patient appears well and is in no acute distress.  RESP: Breathing comfortably and unlabored speech.    NEURO: A&Ox4  PSYCH: Normal affect        Relevant Laboratory, radiology and pathology results:  Results for orders placed or performed during the hospital encounter of 07/29/19   Spirometry   Result Value Ref Range    FVC PRE 3.44 2.59 - 4.24 L    FEV1 PRE 2.50 2.03 - 3.33 L    FEV1/FVC PRE 72.57 67.09 - 91.03 %    FEV6 PRE 3.27 2.75 - 4.20 L    FEV1/FEV6 PRE 76.36 72.09 - 89.69 %    FEF25-75% PRE 1.86 1.22 - 3.54 L/s    ISOFEF25-75 PRE 1.86 L/s    FEF50% PRE 2.33 1.99 - 5.61 L/s    PEF PRE 5.86 4.85 - 8.51 L/s    FET100%  Change 9.69 sec    FIVC PRE 3.27 2.59 - 4.24 L    FIF50% PRED 3.90 L/s    ZOX/WRU04 pre 59.77 %    PIF PRE 4.04 2.85 - 7.65 L/s    Vol extrap pre 0.05 L    DLCO PRE 11.94 (L) 16.55 - 26.91 ml/(min*mmHg)    DL Adj PRE 54.09 (L) 81.19 - 26.91 ml/(min*mmHg)    DLCO/VA POST 2.53 (L) 3.24 - 5.16 ml/(min*mmHg*L)    DL/VA Adj PRE 1.47 (L) 8.29 - 5.16 ml/(min*mmHg*L)    VA PRE 4.72 4.22 - 6.26 L    IVC PRE 3.29 2.59 - 4.24 L    BHT POST 10.92 sec   Diffusion Studies   Result Value Ref Range    FVC PRE 3.44 2.59 - 4.24 L    FEV1 PRE 2.50 2.03 - 3.33 L    FEV1/FVC PRE 72.57 67.09 - 91.03 %    FEV6 PRE 3.27 2.75 - 4.20 L FEV1/FEV6 PRE 76.36 72.09 - 89.69 %    FEF25-75% PRE 1.86 1.22 - 3.54 L/s    ISOFEF25-75 PRE 1.86 L/s    FEF50% PRE 2.33 1.99 - 5.61 L/s    PEF PRE 5.86 4.85 - 8.51 L/s    FET100% Change 9.69 sec    FIVC PRE 3.27 2.59 - 4.24 L    FIF50% PRED 3.90 L/s    FAO/ZHY86 pre 59.77 %    PIF PRE 4.04 2.85 - 7.65 L/s    Vol extrap pre 0.05 L    DLCO PRE 11.94 (L) 16.55 - 26.91 ml/(min*mmHg)    DL Adj PRE 57.84 (L) 69.62 - 26.91 ml/(min*mmHg)    DLCO/VA POST 2.53 (L) 3.24 - 5.16 ml/(min*mmHg*L)    DL/VA Adj PRE 9.52 (L) 8.41 - 5.16 ml/(min*mmHg*L)    VA PRE 4.72 4.22 - 6.26 L    IVC PRE 3.29 2.59 - 4.24 L    BHT POST 10.92 sec

## 2019-09-16 NOTE — Telephone Encounter (Signed)
Scheduled appt per 2/22 los.  Spoke with pt and she is aware of her appt date and time  Patient requested to get lab,port,md and 2 units in 3/2 due to the appts I had scheduled on 3/1 being to early.  Moved appts to 3/2 per patient request.  Sent 5 hr blood as an add-on, told the patient I will contact her once it has been added to her appt calendar.

## 2019-09-16 NOTE — Telephone Encounter (Signed)
Called and left VM to f/u on fax sent on 09/15/19 with her current lab results and inquiry as to suggestion on prophylaxis in lieu of stopping her levaquin due to vomiting and diarrhea. Provided direct # of RN for return call. Return call from [facility] pharmacist: they recommend Cefdinir 300 mg daily in lieu of levaquin d/c. She will have video visit with NP, Hilliard Clark today and will be informed that our office is ordering the antibiotic for her today. LOS sent for lab/flush and orthostatic v/s on 09/18/19

## 2019-09-17 ENCOUNTER — Encounter: Payer: Self-pay | Admitting: Psychiatry

## 2019-09-17 ENCOUNTER — Encounter: Payer: Self-pay | Admitting: *Deleted

## 2019-09-17 ENCOUNTER — Ambulatory Visit: Payer: BC Managed Care – PPO | Admitting: Internal Medicine

## 2019-09-17 ENCOUNTER — Encounter: Payer: Self-pay | Admitting: Internal Medicine

## 2019-09-17 ENCOUNTER — Other Ambulatory Visit: Payer: BC Managed Care – PPO

## 2019-09-17 VITALS — BP 92/62 | HR 92 | Temp 97.3°F | Ht 66.0 in | Wt 117.4 lb

## 2019-09-17 DIAGNOSIS — Z7960 Long term (current) use of unspecified immunomodulators and immunosuppressants: Secondary | ICD-10-CM | POA: Insufficient documentation

## 2019-09-17 DIAGNOSIS — C9110 Chronic lymphocytic leukemia of B-cell type not having achieved remission: Secondary | ICD-10-CM | POA: Diagnosis not present

## 2019-09-17 DIAGNOSIS — D619 Aplastic anemia, unspecified: Secondary | ICD-10-CM

## 2019-09-17 DIAGNOSIS — D809 Immunodeficiency with predominantly antibody defects, unspecified: Secondary | ICD-10-CM | POA: Diagnosis not present

## 2019-09-17 DIAGNOSIS — R112 Nausea with vomiting, unspecified: Secondary | ICD-10-CM

## 2019-09-17 DIAGNOSIS — R197 Diarrhea, unspecified: Secondary | ICD-10-CM | POA: Insufficient documentation

## 2019-09-17 DIAGNOSIS — R1110 Vomiting, unspecified: Secondary | ICD-10-CM | POA: Insufficient documentation

## 2019-09-17 DIAGNOSIS — R634 Abnormal weight loss: Secondary | ICD-10-CM | POA: Insufficient documentation

## 2019-09-17 DIAGNOSIS — Z79899 Other long term (current) drug therapy: Secondary | ICD-10-CM | POA: Insufficient documentation

## 2019-09-17 NOTE — Unmapped (Signed)
Please return in 1 month for a virtual follow up visit.    Please call us if you experience:    1. Nausea or vomiting not controlled by nausea medicines  2. Fever of 100.5 F or higher, shaking chills, drenching night sweats.  3. Uncontrolled pain  4. Any rapidly enlarging lymph node or mass  5. Unintentional weight loss  6. Any other concerning symptom     MyChart Messages  For your safety and best care, please DO NOT use MyChart messages to report symptoms. (Symptoms should be reported by calling the nurse triage line). Please use MyChart for non-urgent matters such as general questions, non-urgent prescription refills, or non-urgent scheduling issues.     ?? Please do not use MyChart for URGENT messages, as messages are only checked during regular business hours.     ?? Please note that MyChart messages may be routed a central pool and one of your provider???s team members will get back to you.  - Expect up to 3 business days for response     If you have any other questions, please do not hesitate to contact us.    Nurse Navigator: Milinda Antis, RN    Nurse Practitioner: [name]    For health related questions Monday through Friday 8 AM??? 5 PM : please call the office at [phone] and ask to speak with a nurse.  For appointment changes call: Main Clinic [phone].  Toll free number is [phone].    On Nights, Weekends and Holidays:  Call [phone] and ask for the adult hematologist/oncologist on call.      [facility]  [address]  [place], [place] [address]  [email or web address]    Results for orders placed or performed during the hospital encounter of 07/29/19   Spirometry   Result Value Ref Range    FVC PRE 3.44 2.59 - 4.24 L    FEV1 PRE 2.50 2.03 - 3.33 L    FEV1/FVC PRE 72.57 67.09 - 91.03 %    FEV6 PRE 3.27 2.75 - 4.20 L    FEV1/FEV6 PRE 76.36 72.09 - 89.69 %    FEF25-75% PRE 1.86 1.22 - 3.54 L/s    ISOFEF25-75 PRE 1.86 L/s    FEF50% PRE 2.33 1.99 - 5.61 L/s    PEF PRE 5.86 4.85 - 8.51 L/s    FET100% Change 9.69 sec    FIVC PRE 3.27 2.59 - 4.24 L    FIF50% PRED 3.90 L/s    QVZ/DGL87 pre 59.77 %    PIF PRE 4.04 2.85 - 7.65 L/s    Vol extrap pre 0.05 L    DLCO PRE 11.94 (L) 16.55 - 26.91 ml/(min*mmHg)    DL Adj PRE 56.43 (L) 32.95 - 26.91 ml/(min*mmHg)    DLCO/VA POST 2.53 (L) 3.24 - 5.16 ml/(min*mmHg*L)    DL/VA Adj PRE 1.88 (L) 4.16 - 5.16 ml/(min*mmHg*L)    VA PRE 4.72 4.22 - 6.26 L    IVC PRE 3.29 2.59 - 4.24 L    BHT POST 10.92 sec   Diffusion Studies   Result Value Ref Range    FVC PRE 3.44 2.59 - 4.24 L    FEV1 PRE 2.50 2.03 - 3.33 L    FEV1/FVC PRE 72.57 67.09 - 91.03 %    FEV6 PRE 3.27 2.75 - 4.20 L    FEV1/FEV6 PRE 76.36 72.09 - 89.69 %    FEF25-75% PRE 1.86 1.22 - 3.54 L/s    SAYTKZ60-10 PRE 1.86 L/s  FEF50% PRE 2.33 1.99 - 5.61 L/s    PEF PRE 5.86 4.85 - 8.51 L/s    FET100% Change 9.69 sec    FIVC PRE 3.27 2.59 - 4.24 L    FIF50% PRED 3.90 L/s    VWU/JWJ19 pre 59.77 %    PIF PRE 4.04 2.85 - 7.65 L/s    Vol extrap pre 0.05 L    DLCO PRE 11.94 (L) 16.55 - 26.91 ml/(min*mmHg)    DL Adj PRE 14.78 (L) 29.56 - 26.91 ml/(min*mmHg)    DLCO/VA POST 2.53 (L) 3.24 - 5.16 ml/(min*mmHg*L)    DL/VA Adj PRE 2.13 (L) 0.86 - 5.16 ml/(min*mmHg*L)    VA PRE 4.72 4.22 - 6.26 L    IVC PRE 3.29 2.59 - 4.24 L    BHT POST 10.92 sec

## 2019-09-17 NOTE — Unmapped (Signed)
I spoke with patient Susan Kane to confirm appointments on the following date(s): 10/14/2019.  Pt. confirmed date and time    Danford Bad

## 2019-09-17 NOTE — Progress Notes (Signed)
Faxed lithium level of 09/15/19 to Dr. Clovis Pu and all labs including tacrolimus level to [facility] [phone]

## 2019-09-17 NOTE — Progress Notes (Signed)
Meagan Walsh 59 y.o. 02-25-1961 [id number]  Assessment & Plan:   Encounter Diagnoses  Name Primary?  . Watery diarrhea Yes  . Non-intractable vomiting with nausea, unspecified vomiting type   . 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 CLL (chronic lymphocytic leukemia) ([place])   . Long-term use of immunosuppressant medication   . Loss of weight   . Aplastic anemia (HCC)      Complicated situation in an immunosuppressed woman with CLL, aplastic anemia and awaiting possible stem cell transplant.  I am starting work-up with stool studies as immunoglobulin deficiencies increase risk enteric infections and to date has not been checked.  Orders Placed This Encounter  Procedures  . Stool culture  . Ova and parasite examination  . Fecal lactoferrin, quant  . Gastrointestinal Pathogen Panel PCR  . Clostridium difficile Toxin B, Qualitative, Real-Time PCR     If these do not provide an answer then need to consider endoscopic evaluation.  Her neutropenia and thrombocytopenia problems increase risk of infection and bleeding though perhaps prophylactic antibiotics would allow safely. Would need help from heme-onc teams on this.  I doubt this is medication side effect - at least not what she is on now.  [id number], Meagan Reichmann, MD Dr. [name] Alla Feeling, NP Dr. Ivette Loyal Subjective:   Chief Complaint:  HPI The patient is a 59 year old white woman referred by Dr. Benay Walsh regarding nausea and diarrhea in the setting of chronic lymphocytic leukemia, aplastic anemia awaiting possible stem cell transplant for these problems.  She tells me nausea and diarrhea problems began after worse antithymocyte globulin and cyclosporine treatment in August. Over time she has described problems worsening with postprandial borborygmi on and off some nausea and some vomiting.  Most of the time.  She has a tightness in her lower abdominal area in a bandlike situation to where  she pulls her pants away to avoid discomfort.  There is occasional heartburn Tums may help.  Watery diarrhea several times a day usually postprandial not nocturnal.  She has not tried any over-the-counter agents because she is afraid of being constipated.  She seems to manage making it to the bathroom etc. despite urgent stools without any antidiarrheals.  Ondansetron for nausea and vomiting is plus minus.  She had Levaquin stopped and felt a little less nauseous.  Changed to a cephalosporin.  Cyclosporine was changed to tacrolimus and that might of helped a little bit.  Nevertheless she has lost a large amount of weight she says 25 pounds in a few months, she has been told she needs to gain weight in order to qualify for a stem cell transplant.  Despite trying to eat etc. she is struggling.  She is on many supportive medications for her aplastic anemia yet her absolute neutrophil count is still very low.  Wt Readings from Last 3 Encounters:  09/17/19 117 lb 6 oz (53.2 kg)  09/15/19 115 lb 1.6 oz (52.2 kg)  09/08/19 117 lb 9.6 oz (53.3 kg)   Lab Results  Component Value Date   WBC 3.0 (L) 09/15/2019   HGB 8.9 (L) 09/15/2019   HCT 27.0 (L) 09/15/2019   MCV 89.7 09/15/2019   PLT 36 (L) 09/15/2019   Lab Results  Component Value Date   ALT 16 09/15/2019   AST 12 (L) 09/15/2019   ALKPHOS 107 09/15/2019   BILITOT 0.4 09/15/2019     Chemistry      Component Value Date/Time  NA 140 09/15/2019 1015   K 3.7 09/15/2019 1015   CL 108 09/15/2019 1015   CO2 21 (L) 09/15/2019 1015   BUN 21 (H) 09/15/2019 1015   CREATININE 1.42 (H) 09/15/2019 1015      Component Value Date/Time   CALCIUM 9.7 09/15/2019 1015   ALKPHOS 107 09/15/2019 1015   AST 12 (L) 09/15/2019 1015   ALT 16 09/15/2019 1015   BILITOT 0.4 09/15/2019 1015      No Known Allergies Current Meds  Medication Sig  . acetaminophen (TYLENOL) 325 MG tablet Take 650 mg by mouth every 6 (six) hours as needed.  . ALPRAZolam (XANAX)  0.25 MG tablet TAKE 1 TO 2 TABLETS BY MOUTH EVERY DAY AS NEEDED FOR ANXIETY  . deferasirox (EXJADE) 500 MG disintegrating tablet Take 500 mg by mouth daily before breakfast.  . eltrombopag (PROMACTA) 50 MG tablet Take 3 tablets (150 mg total) by mouth daily. Take on an empty stomach 1 hour before a meal or 2 hours after  . folic acid (FOLVITE) 1 MG tablet TAKE 1 TABLET BY MOUTH EVERY DAY  . levothyroxine (SYNTHROID) 100 MCG tablet TAKE 1 TABLET BY MOUTH EVERY DAY  . lithium 300 MG tablet Take 300 mg by mouth at bedtime.  [name] LORazepam (ATIVAN) 0.5 MG tablet Take 1 tablet (0.5 mg total) by mouth 2 (two) times daily as needed (prior to tacrolimus dose if needed).  . ondansetron (ZOFRAN-ODT) 8 MG disintegrating tablet DISSOLVE 1 TABLET EVERY 8 HOURS AS NEEDED FOR NAUSEA OR VOMITING  . posaconazole (NOXAFIL) 100 MG TBEC delayed-release tablet TAKE 3 TABLETS BY MOUTH DAILY  . sulfamethoxazole-trimethoprim (BACTRIM DS) 800-160 MG tablet Take 1 tablet by mouth 2 (two) times a week.  . tacrolimus (PROGRAF) 1 MG capsule Take 1 mg by mouth 2 (two) times daily.   . TRINTELLIX 20 MG TABS tablet TAKE 1 TABLET BY MOUTH EVERY DAY  . valACYclovir (VALTREX) 500 MG tablet Take 500 mg by mouth daily.  [name] zolpidem (AMBIEN) 10 MG tablet TAKE 1/2 TO 1 TABLET BY MOUTH AT BEDTIME AS NEEDED FOR INSOMNIA.   Past Medical History:  Diagnosis Date  . Anxiety   . Cancer ([place])    CLL  . Degenerative disk disease    Neck--chronic pain  . Depression   . Fibroids    Uterine  . 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 Hypothyroidism   . Lymphocytosis   . Mitral valve prolapse   . Mitral valve prolapse   . Thyroid disease    Hypothyroid   Past Surgical History:  Procedure Laterality Date  . BREAST BIOPSY Left 2000  . CESAREAN SECTION  1996  . MELANOMA EXCISION WITH SENTINEL LYMPH NODE BIOPSY Left 10/01/2017   Procedure: EXCISIONAL BIOPSY OF LEFT INGUINAL LYMPH NODE;  Surgeon: Greer Pickerel, MD;  Location: [facility];  Service: General;  Laterality: Left;  . REVISION OF ABDOMINAL SCAR    . TONSILLECTOMY     age 31   Social History   Social History Narrative   Pt lives in 2 story home with her husband   Has 3 adult children   Masters degree   Works at [employer]   family history includes Breast cancer in her mother; Prostate cancer in her father.   Review of Systems As above per HPI  Objective:   Physical Exam BP 92/62 (BP Location: Right Arm, Patient Position: Sitting, Cuff Size: Normal)   Pulse 92  Temp (!) 97.3 F (36.3 C)   Ht 5\' 6"  (1.676 m)   Wt 117 lb 6 oz (53.2 kg)   SpO2 97%   BMI 18.94 kg/m  Somewhat frail and chronicall ill Skin - gray and purpura Eyes anicteric Mouth - gingivitis Lungs cta Cor NL abd soft and NT BS + Ext no c/ce Neuro a and o x 3 Mood NL   Reviewed heme onc notes, labs imaging   70 mins total time in care

## 2019-09-17 NOTE — Patient Instructions (Signed)
Your provider has requested that you go to the basement level for lab work before leaving today. Press "B" on the elevator. The lab is located at the first door on the left as you exit the elevator.   Normal BMI (Body Mass Index- based on height and weight) is between 19 and 25. Your BMI today is Body mass index is 18.94 kg/m. [name] Please consider follow up  regarding your BMI with your Primary Care Provider.   Due to recent changes in healthcare laws, you may see the results of your imaging and laboratory studies on MyChart before your provider has had a chance to review them.  We understand that in some cases there may be results that are confusing or concerning to you. Not all laboratory results come back in the same time frame and the provider may be waiting for multiple results in order to interpret others.  Please give [place] 48 hours in order for your provider to thoroughly review all the results before contacting the office for clarification of your results.    I appreciate the opportunity to care for you. Silvano Rusk, MD, [facility]

## 2019-09-18 ENCOUNTER — Other Ambulatory Visit: Payer: Self-pay

## 2019-09-18 ENCOUNTER — Telehealth: Payer: Self-pay | Admitting: *Deleted

## 2019-09-18 ENCOUNTER — Inpatient Hospital Stay: Payer: BC Managed Care – PPO

## 2019-09-18 ENCOUNTER — Other Ambulatory Visit: Payer: BC Managed Care – PPO

## 2019-09-18 ENCOUNTER — Telehealth: Payer: Self-pay | Admitting: Oncology

## 2019-09-18 ENCOUNTER — Other Ambulatory Visit: Payer: Self-pay | Admitting: *Deleted

## 2019-09-18 VITALS — BP 101/59 | HR 91 | Temp 98.7°F | Resp 18

## 2019-09-18 DIAGNOSIS — D801 Nonfamilial hypogammaglobulinemia: Secondary | ICD-10-CM | POA: Diagnosis not present

## 2019-09-18 DIAGNOSIS — M25511 Pain in right shoulder: Secondary | ICD-10-CM | POA: Diagnosis not present

## 2019-09-18 DIAGNOSIS — R112 Nausea with vomiting, unspecified: Secondary | ICD-10-CM | POA: Diagnosis not present

## 2019-09-18 DIAGNOSIS — R251 Tremor, unspecified: Secondary | ICD-10-CM | POA: Diagnosis not present

## 2019-09-18 DIAGNOSIS — D696 Thrombocytopenia, unspecified: Secondary | ICD-10-CM

## 2019-09-18 DIAGNOSIS — Z95828 Presence of other vascular implants and grafts: Secondary | ICD-10-CM

## 2019-09-18 DIAGNOSIS — M1990 Unspecified osteoarthritis, unspecified site: Secondary | ICD-10-CM | POA: Diagnosis not present

## 2019-09-18 DIAGNOSIS — C9110 Chronic lymphocytic leukemia of B-cell type not having achieved remission: Secondary | ICD-10-CM | POA: Diagnosis not present

## 2019-09-18 DIAGNOSIS — J189 Pneumonia, unspecified organism: Secondary | ICD-10-CM | POA: Diagnosis not present

## 2019-09-18 DIAGNOSIS — R519 Headache, unspecified: Secondary | ICD-10-CM | POA: Diagnosis not present

## 2019-09-18 DIAGNOSIS — D649 Anemia, unspecified: Secondary | ICD-10-CM

## 2019-09-18 DIAGNOSIS — Z79899 Other long term (current) drug therapy: Secondary | ICD-10-CM | POA: Diagnosis not present

## 2019-09-18 DIAGNOSIS — R197 Diarrhea, unspecified: Secondary | ICD-10-CM

## 2019-09-18 DIAGNOSIS — E039 Hypothyroidism, unspecified: Secondary | ICD-10-CM | POA: Diagnosis not present

## 2019-09-18 DIAGNOSIS — D61818 Other pancytopenia: Secondary | ICD-10-CM | POA: Diagnosis not present

## 2019-09-18 LAB — CBC WITH DIFFERENTIAL (CANCER CENTER ONLY)
Abs Immature Granulocytes: 0.02 10*3/uL (ref 0.00–0.07)
Basophils Absolute: 0 10*3/uL (ref 0.0–0.1)
Basophils Relative: 0 %
Eosinophils Absolute: 0 10*3/uL (ref 0.0–0.5)
Eosinophils Relative: 1 %
HCT: 24.4 % — ABNORMAL LOW (ref 36.0–46.0)
Hemoglobin: 8.1 g/dL — ABNORMAL LOW (ref 12.0–15.0)
Immature Granulocytes: 1 %
Lymphocytes Relative: 88 %
Lymphs Abs: 2.3 10*3/uL (ref 0.7–4.0)
MCH: 29.8 pg (ref 26.0–34.0)
MCHC: 33.2 g/dL (ref 30.0–36.0)
MCV: 89.7 fL (ref 80.0–100.0)
Monocytes Absolute: 0.2 10*3/uL (ref 0.1–1.0)
Monocytes Relative: 7 %
Neutro Abs: 0.1 10*3/uL — CL (ref 1.7–7.7)
Neutrophils Relative %: 3 %
Platelet Count: 41 10*3/uL — ABNORMAL LOW (ref 150–400)
RBC: 2.72 MIL/uL — ABNORMAL LOW (ref 3.87–5.11)
RDW: 15.4 % (ref 11.5–15.5)
WBC Count: 2.7 10*3/uL — ABNORMAL LOW (ref 4.0–10.5)
nRBC: 0 % (ref 0.0–0.2)

## 2019-09-18 MED ORDER — HEPARIN SOD (PORK) LOCK FLUSH 100 UNIT/ML IV SOLN
500.0000 [IU] | Freq: Once | INTRAVENOUS | Status: AC | PRN
  Administered 2019-09-18: 500 [IU]
  Filled 2019-09-18: qty 5

## 2019-09-18 MED ORDER — SODIUM CHLORIDE 0.9% FLUSH
10.0000 mL | INTRAVENOUS | Status: DC | PRN
  Administered 2019-09-18: 10 mL
  Filled 2019-09-18: qty 10

## 2019-09-18 NOTE — Telephone Encounter (Signed)
CRITICAL VALUE STICKER  CRITICAL VALUE: ANC 0.1   RECEIVER (on-site recipient of call): Beth/Tamon Parkerson  DATE & TIME NOTIFIED: [id number] on 2/25  MESSENGER (representative from lab): Pam  MD NOTIFIED: Dr. Benay Spice  TIME OF NOTIFICATION: 1355  RESPONSE: f/u on 3/2 as scheduled. Call for fever or s/s infection  Notified Jaydalyn of CBC results. She would like to have 1 unit blood this week--LOS sent to scheduler. She will keep her blood bank bracelet on for transfusion on 2/26/ or 2/027. She is aware to call for fever of s/s infection. Reports she does feel stronger than she did earlier in the week.

## 2019-09-18 NOTE — Progress Notes (Signed)
Blood bank notified of orders to transfuse 1 unit on 09/19/19.

## 2019-09-18 NOTE — Telephone Encounter (Signed)
Scheduled appt per 2/25 sch message - unable to reach pt . Left message with appt date and time

## 2019-09-18 NOTE — Patient Instructions (Signed)

## 2019-09-19 ENCOUNTER — Inpatient Hospital Stay: Payer: BC Managed Care – PPO

## 2019-09-19 ENCOUNTER — Other Ambulatory Visit: Payer: Self-pay

## 2019-09-19 ENCOUNTER — Other Ambulatory Visit: Payer: Self-pay | Admitting: *Deleted

## 2019-09-19 DIAGNOSIS — Z79899 Other long term (current) drug therapy: Secondary | ICD-10-CM | POA: Diagnosis not present

## 2019-09-19 DIAGNOSIS — C9110 Chronic lymphocytic leukemia of B-cell type not having achieved remission: Secondary | ICD-10-CM | POA: Diagnosis not present

## 2019-09-19 DIAGNOSIS — D649 Anemia, unspecified: Secondary | ICD-10-CM

## 2019-09-19 DIAGNOSIS — R112 Nausea with vomiting, unspecified: Secondary | ICD-10-CM | POA: Diagnosis not present

## 2019-09-19 DIAGNOSIS — R251 Tremor, unspecified: Secondary | ICD-10-CM | POA: Diagnosis not present

## 2019-09-19 DIAGNOSIS — R519 Headache, unspecified: Secondary | ICD-10-CM | POA: Diagnosis not present

## 2019-09-19 DIAGNOSIS — D61818 Other pancytopenia: Secondary | ICD-10-CM | POA: Diagnosis not present

## 2019-09-19 DIAGNOSIS — J189 Pneumonia, unspecified organism: Secondary | ICD-10-CM | POA: Diagnosis not present

## 2019-09-19 DIAGNOSIS — M1990 Unspecified osteoarthritis, unspecified site: Secondary | ICD-10-CM | POA: Diagnosis not present

## 2019-09-19 DIAGNOSIS — M25511 Pain in right shoulder: Secondary | ICD-10-CM | POA: Diagnosis not present

## 2019-09-19 DIAGNOSIS — E039 Hypothyroidism, unspecified: Secondary | ICD-10-CM | POA: Diagnosis not present

## 2019-09-19 DIAGNOSIS — D801 Nonfamilial hypogammaglobulinemia: Secondary | ICD-10-CM | POA: Diagnosis not present

## 2019-09-19 LAB — BPAM RBC
Blood Product Expiration Date: 202103252359
Unit Type and Rh: 5100

## 2019-09-19 LAB — TYPE AND SCREEN
ABO/RH(D): O POS
Antibody Screen: NEGATIVE
Unit division: 0

## 2019-09-19 LAB — SAMPLE TO BLOOD BANK

## 2019-09-19 MED ORDER — SODIUM CHLORIDE 0.9% IV SOLUTION
250.0000 mL | Freq: Once | INTRAVENOUS | Status: AC
  Administered 2019-09-19: 250 mL via INTRAVENOUS
  Filled 2019-09-19: qty 250

## 2019-09-19 MED ORDER — HEPARIN SOD (PORK) LOCK FLUSH 100 UNIT/ML IV SOLN
250.0000 [IU] | INTRAVENOUS | Status: AC | PRN
  Administered 2019-09-19: 250 [IU]
  Filled 2019-09-19: qty 5

## 2019-09-19 MED ORDER — SODIUM CHLORIDE 0.9% FLUSH
10.0000 mL | INTRAVENOUS | Status: AC | PRN
  Administered 2019-09-19: 10 mL
  Filled 2019-09-19: qty 10

## 2019-09-19 NOTE — Patient Instructions (Signed)

## 2019-09-20 LAB — TYPE AND SCREEN
ABO/RH(D): O POS
Antibody Screen: NEGATIVE
Unit division: 0

## 2019-09-20 LAB — BPAM RBC
Blood Product Expiration Date: 202103252359
ISSUE DATE / TIME: 202102261453
Unit Type and Rh: 5100

## 2019-09-22 ENCOUNTER — Ambulatory Visit: Payer: BC Managed Care – PPO | Admitting: Oncology

## 2019-09-22 ENCOUNTER — Other Ambulatory Visit: Payer: BC Managed Care – PPO

## 2019-09-22 ENCOUNTER — Telehealth: Payer: Self-pay | Admitting: *Deleted

## 2019-09-22 NOTE — Unmapped (Signed)
I called Dr. Kalman Drape RN [facility]) to inquire if Susan Kane has remained off Exjade. There's been some confusion about starting/stopping (was recently held to see if it was contributing to nausea, it wasn't so it was restarted, but at [facility] our team preferred this to be held due to SCr). They will clarify with patient when she is seen tomorrow in [place]. I clarified from our perspective we would prefer that she continue to HOLD this medication if she is not currently due to poor renal function. I will remove med from list.    The preferred oral iron chelator for this patient would typically be Jadenu, at a dose of 14 mg/kg, likely requires 50% dose reduction. Consider this when restarting iron chelation instead of Exjade.    Of note, most recent tacrolimus level from 2/22 continues to be therapeutic at 7.5 (goal 5-15). Her dose has been 1 mg BID. Her blood counts however remain low (ANC 0.1, PLT 41, Hgb 8.1). She remains on Promacta 150 mg. She should continue ppx with cefdinir 300 mg once daily, posaconazole 300 mg once daily, Bactrim on weekends, and valtrex once daily.       Manfred Arch, PharmD, BCOP, CPP  Pager: [phone]

## 2019-09-22 NOTE — Telephone Encounter (Signed)
Pharmacist called to inquire if she is still on Exjade 500 mg daily? They are requesting MD d/c this based on her renal functions. In future, if needed they prefer to use Jadenu instead.

## 2019-09-23 ENCOUNTER — Inpatient Hospital Stay (HOSPITAL_BASED_OUTPATIENT_CLINIC_OR_DEPARTMENT_OTHER): Payer: BC Managed Care – PPO | Admitting: Nurse Practitioner

## 2019-09-23 ENCOUNTER — Telehealth: Payer: Self-pay | Admitting: Oncology

## 2019-09-23 ENCOUNTER — Inpatient Hospital Stay: Payer: BC Managed Care – PPO

## 2019-09-23 ENCOUNTER — Inpatient Hospital Stay: Payer: BC Managed Care – PPO | Attending: Oncology

## 2019-09-23 ENCOUNTER — Other Ambulatory Visit: Payer: Self-pay

## 2019-09-23 VITALS — BP 123/67 | HR 93 | Temp 98.9°F | Resp 16 | Wt 116.8 lb

## 2019-09-23 DIAGNOSIS — D649 Anemia, unspecified: Secondary | ICD-10-CM

## 2019-09-23 DIAGNOSIS — R112 Nausea with vomiting, unspecified: Secondary | ICD-10-CM | POA: Diagnosis not present

## 2019-09-23 DIAGNOSIS — D61818 Other pancytopenia: Secondary | ICD-10-CM | POA: Insufficient documentation

## 2019-09-23 DIAGNOSIS — D696 Thrombocytopenia, unspecified: Secondary | ICD-10-CM

## 2019-09-23 DIAGNOSIS — Z79899 Other long term (current) drug therapy: Secondary | ICD-10-CM | POA: Insufficient documentation

## 2019-09-23 DIAGNOSIS — N939 Abnormal uterine and vaginal bleeding, unspecified: Secondary | ICD-10-CM | POA: Diagnosis not present

## 2019-09-23 DIAGNOSIS — Z95828 Presence of other vascular implants and grafts: Secondary | ICD-10-CM

## 2019-09-23 DIAGNOSIS — C9110 Chronic lymphocytic leukemia of B-cell type not having achieved remission: Secondary | ICD-10-CM | POA: Insufficient documentation

## 2019-09-23 DIAGNOSIS — M25519 Pain in unspecified shoulder: Secondary | ICD-10-CM | POA: Diagnosis not present

## 2019-09-23 DIAGNOSIS — D801 Nonfamilial hypogammaglobulinemia: Secondary | ICD-10-CM | POA: Insufficient documentation

## 2019-09-23 DIAGNOSIS — Z452 Encounter for adjustment and management of vascular access device: Secondary | ICD-10-CM | POA: Insufficient documentation

## 2019-09-23 DIAGNOSIS — K068 Other specified disorders of gingiva and edentulous alveolar ridge: Secondary | ICD-10-CM | POA: Diagnosis not present

## 2019-09-23 DIAGNOSIS — F418 Other specified anxiety disorders: Secondary | ICD-10-CM | POA: Insufficient documentation

## 2019-09-23 DIAGNOSIS — R197 Diarrhea, unspecified: Secondary | ICD-10-CM | POA: Diagnosis not present

## 2019-09-23 DIAGNOSIS — E039 Hypothyroidism, unspecified: Secondary | ICD-10-CM | POA: Diagnosis not present

## 2019-09-23 LAB — CBC WITH DIFFERENTIAL (CANCER CENTER ONLY)
Abs Immature Granulocytes: 0.01 10*3/uL (ref 0.00–0.07)
Basophils Absolute: 0 10*3/uL (ref 0.0–0.1)
Basophils Relative: 1 %
Eosinophils Absolute: 0 10*3/uL (ref 0.0–0.5)
Eosinophils Relative: 1 %
HCT: 29.8 % — ABNORMAL LOW (ref 36.0–46.0)
Hemoglobin: 9.9 g/dL — ABNORMAL LOW (ref 12.0–15.0)
Immature Granulocytes: 0 %
Lymphocytes Relative: 87 %
Lymphs Abs: 3.7 10*3/uL (ref 0.7–4.0)
MCH: 29.2 pg (ref 26.0–34.0)
MCHC: 33.2 g/dL (ref 30.0–36.0)
MCV: 87.9 fL (ref 80.0–100.0)
Monocytes Absolute: 0.3 10*3/uL (ref 0.1–1.0)
Monocytes Relative: 7 %
Neutro Abs: 0.2 10*3/uL — CL (ref 1.7–7.7)
Neutrophils Relative %: 4 %
Platelet Count: 45 10*3/uL — ABNORMAL LOW (ref 150–400)
RBC: 3.39 MIL/uL — ABNORMAL LOW (ref 3.87–5.11)
RDW: 15.2 % (ref 11.5–15.5)
WBC Count: 4.4 10*3/uL (ref 4.0–10.5)
nRBC: 0 % (ref 0.0–0.2)

## 2019-09-23 LAB — CMP (CANCER CENTER ONLY)
ALT: 24 U/L (ref 0–44)
AST: 18 U/L (ref 15–41)
Albumin: 4.2 g/dL (ref 3.5–5.0)
Alkaline Phosphatase: 119 U/L (ref 38–126)
Anion gap: 9 (ref 5–15)
BUN: 26 mg/dL — ABNORMAL HIGH (ref 6–20)
CO2: 22 mmol/L (ref 22–32)
Calcium: 9.8 mg/dL (ref 8.9–10.3)
Chloride: 109 mmol/L (ref 98–111)
Creatinine: 1.6 mg/dL — ABNORMAL HIGH (ref 0.44–1.00)
GFR, Est AFR Am: 40 mL/min — ABNORMAL LOW (ref >60)
GFR, Estimated: 35 mL/min — ABNORMAL LOW (ref >60)
Glucose, Bld: 188 mg/dL — ABNORMAL HIGH (ref 70–99)
Potassium: 4.3 mmol/L (ref 3.5–5.1)
Sodium: 140 mmol/L (ref 135–145)
Total Bilirubin: 0.5 mg/dL (ref 0.3–1.2)
Total Protein: 6.3 g/dL — ABNORMAL LOW (ref 6.5–8.1)

## 2019-09-23 LAB — MAGNESIUM: Magnesium: 1.9 mg/dL (ref 1.7–2.4)

## 2019-09-23 LAB — SAMPLE TO BLOOD BANK

## 2019-09-23 MED ORDER — HEPARIN SOD (PORK) LOCK FLUSH 100 UNIT/ML IV SOLN
500.0000 [IU] | Freq: Once | INTRAVENOUS | Status: AC | PRN
  Administered 2019-09-23: 250 [IU]
  Filled 2019-09-23: qty 5

## 2019-09-23 MED ORDER — SODIUM CHLORIDE 0.9% FLUSH
10.0000 mL | INTRAVENOUS | Status: DC | PRN
  Administered 2019-09-23: 3 mL
  Filled 2019-09-23: qty 10

## 2019-09-23 NOTE — Unmapped (Signed)
[facility] Refill Coordination Note    Specialty Medication(s) to be Shipped:   Transplant: tacrolimus 1mg     Other medication(s) to be shipped: n/a     Susan Kane, DOB: Nov 09, 1960  Phone: [phone] (home)       All above HIPAA information was verified with patient.     Was a Nurse, learning disability used for this call? No    Completed refill call assessment today to schedule patient's medication shipment from the [facility] [phone]).       Specialty medication(s) and dose(s) confirmed: Regimen is correct and unchanged.   Changes to medications: Susan Kane reports no changes at this time.  Changes to insurance: No  Questions for the pharmacist: No    Confirmed patient received Welcome Packet with first shipment. The patient will receive a drug information handout for each medication shipped and additional FDA Medication Guides as required.       DISEASE/MEDICATION-SPECIFIC INFORMATION        N/A    SPECIALTY MEDICATION ADHERENCE     Medication Adherence    Patient reported X missed doses in the last month: 0  Specialty Medication: Tacrolimus  Patient is on additional specialty medications: No  Patient is on more than two specialty medications: No  Any gaps in refill history greater than 2 weeks in the last 3 months: no  Demonstrates understanding of importance of adherence: yes  Informant: patient                Tacrolimus 1mg : Patient has 12 days of medication on hand      SHIPPING     Shipping address confirmed in Epic.     Delivery Scheduled: Yes, Expected medication delivery date: 3/12.     Medication will be delivered via UPS to the prescription address in Epic WAM.    Susan Kane   [facility] Specialty Technician

## 2019-09-23 NOTE — Progress Notes (Signed)
Received critical for pt ANC 0.2. Provider Ned Card, NP made aware.

## 2019-09-23 NOTE — Patient Instructions (Signed)
PICC Home Care Guide  A peripherally inserted central catheter (PICC) is a form of IV access that allows medicines and IV fluids to be quickly distributed throughout the body. The PICC is a long, thin, flexible tube (catheter) that is inserted into a vein in the upper arm. The catheter ends in a large vein in the chest (superior vena cava, or SVC). After the PICC is inserted, a chest X-ray may be done to make sure that it is in the correct place. A PICC may be placed for different reasons, such as:  To give medicines and liquid nutrition.  To give IV fluids and blood products.  If there is trouble placing a peripheral intravenous (PIV) catheter. If taken care of properly, a PICC can remain in place for several months. Having a PICC can also allow a person to go home from the hospital sooner. Medicine and PICC care can be managed at home by a family member, caregiver, or home health care team. What are the risks? Generally, having a PICC is safe. However, problems may occur, including:  A blood clot (thrombus) forming in or at the tip of the PICC.  A blood clot forming in a vein (deep vein thrombosis) or traveling to the lung (pulmonary embolism).  Inflammation of the vein (phlebitis) in which the PICC is placed.  Infection. Central line associated blood stream infection (CLABSI) is a serious infection that often requires hospitalization.  PICC movement (malposition). The PICC tip may move from its original position due to excessive physical activity, forceful coughing, sneezing, or vomiting.  A break or cut in the PICC. It is important not to use scissors near the PICC.  Nerve or tendon irritation or injury during PICC insertion. How to take care of your PICC Preventing problems  You and any caregivers should wash your hands often with soap. Wash hands: ? Before touching the PICC line or the infusion device. ? Before changing a bandage (dressing).  Flush the PICC as told by your  health care provider. Let your health care provider know right away if the PICC is hard to flush or does not flush. Do not use force to flush the PICC.  Do not use a syringe that is less than 10 mL to flush the PICC.  Avoid blood pressure checks on the arm in which the PICC is placed.  Never pull or tug on the PICC.  Do not take the PICC out yourself. Only a trained clinical professional should remove the PICC.  Use clean and sterile supplies only. Keep the supplies in a dry place. Do not reuse needles, syringes, or any other supplies. Doing that can lead to infection.  Keep pets and children away from your PICC line.  Check the PICC insertion site every day for signs of infection. Check for: ? Leakage. ? Redness, swelling, or pain. ? Fluid or blood. ? Warmth. ? Pus or a bad smell. PICC dressing care  Keep your PICC bandage (dressing) clean and dry to prevent infection.  Do not take baths, swim, or use a hot tub until your health care provider approves. Ask your health care provider if you can take showers. You may only be allowed to take sponge baths for bathing. When you are allowed to shower: ? Ask your health care provider to teach you how to wrap the PICC line. ? Cover the PICC line with clear plastic wrap and tape to keep it dry while showering.  Follow instructions from your health care provider   about how to take care of your insertion site and dressing. Make sure you: ? Wash your hands with soap and water before you change your bandage (dressing). If soap and water are not available, use hand sanitizer. ? Change your dressing as told by your health care provider. ? Leave stitches (sutures), skin glue, or adhesive strips in place. These skin closures may need to stay in place for 2 weeks or longer. If adhesive strip edges start to loosen and curl up, you may trim the loose edges. Do not remove adhesive strips completely unless your health care provider tells you to do  that.  Change your PICC dressing if it becomes loose or wet. General instructions   Carry your PICC identification card or wear a medical alert bracelet at all times.  Keep the tube clamped at all times, unless it is being used.  Carry a smooth-edge clamp with you at all times to place on the tube if it breaks.  Do not use scissors or sharp objects near the tube.  You may bend your arm and move it freely. If your PICC is near or at the bend of your elbow, avoid activity with repeated motion at the elbow.  Avoid lifting heavy objects as told by your health care provider.  Keep all follow-up visits as told by your health care provider. This is important. Disposal of supplies  Throw away any syringes in a disposal container that is meant for sharp items (sharps container). You can buy a sharps container from a pharmacy, or you can make one by using an empty hard plastic bottle with a cover.  Place any used dressings or infusion bags into a plastic bag. Throw that bag in the trash. Contact a health care provider if:  You have pain in your arm, ear, face, or teeth.  You have a fever or chills.  You have redness, swelling, or pain around the insertion site.  You have fluid or blood coming from the insertion site.  Your insertion site feels warm to the touch.  You have pus or a bad smell coming from the insertion site.  Your skin feels hard and raised around the insertion site. Get help right away if:  Your PICC is accidentally pulled all the way out. If this happens, cover the insertion site with a bandage or gauze dressing. Do not throw the PICC away. Your health care provider will need to check it.  Your PICC was tugged or pulled and has partially come out. Do not  push the PICC back in.  You cannot flush the PICC, it is hard to flush, or the PICC leaks around the insertion site when it is flushed.  You hear a "flushing" sound when the PICC is flushed.  You feel your  heart racing or skipping beats.  There is a hole or tear in the PICC.  You have swelling in the arm in which the PICC was inserted.  You have a red streak going up your arm from where the PICC was inserted. Summary  A peripherally inserted central catheter (PICC) is a long, thin, flexible tube (catheter) that is inserted into a vein in the upper arm.  The PICC is inserted using a sterile technique by a specially trained nurse or physician. Only a trained clinical professional should remove it.  Keep your PICC identification card with you at all times.  Avoid blood pressure checks on the arm in which the PICC is placed.  If cared for   properly, a PICC can remain in place for several months. Having a PICC can also allow a person to go home from the hospital sooner. This information is not intended to replace advice given to you by your health care provider. Make sure you discuss any questions you have with your health care provider. Document Revised: 06/22/2017 Document Reviewed: 08/12/2016 Elsevier Patient Education  2020 [employer]  

## 2019-09-23 NOTE — Telephone Encounter (Signed)
Scheduled appts per 3/2 sch msg.

## 2019-09-23 NOTE — Progress Notes (Addendum)
[facility] OFFICE PROGRESS NOTE   Diagnosis: CLL, aplastic anemia  INTERVAL HISTORY:   Meagan Walsh returns as scheduled.  She was transfused a unit of blood on 09/19/2019.  She notes dyspnea is better.  Nausea is "a little better".  She estimates vomiting 3 times a week always at nighttime.  She continues to have mild gum bleeding.  She is also continuing to have vaginal bleeding.  No fever.  She is trying to eat more.  She is taking a nutritional supplements.  Her weight is stable.  She is trying to increase her activity level.  She continues to have diarrhea.  Shoulder pain is better.  Objective:  Vital signs in last 24 hours:  Blood pressure 123/67, pulse 93, temperature 98.9 F (37.2 C), temperature source Temporal, resp. rate 16, weight 116 lb 12.8 oz (53 kg), SpO2 100 %.    HEENT: Gum hypertrophy.  No bleeding.  No thrush. Lymphatics: No palpable cervical, supraclavicular, axillary or inguinal lymph nodes. Resp: Lungs clear bilaterally. Cardio: Regular rate and rhythm. GI: Abdomen soft and nontender.  No hepatomegaly.  No splenomegaly. Vascular: No leg edema. Neuro: Alert and oriented. Left upper extremity PICC site without erythema.  Lab Results:  Lab Results  Component Value Date   WBC 4.4 09/23/2019   HGB 9.9 (L) 09/23/2019   HCT 29.8 (L) 09/23/2019   MCV 87.9 09/23/2019   PLT 45 (L) 09/23/2019   NEUTROABS 0.2 (LL) 09/23/2019    Imaging:  No results found.  Medications: I have reviewed the patient's current medications.  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 Mild splenomegaly.  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) deletion, no evidence of 11q22deletion  Bone marrow biopsy 02/26/2018-hypercellular marrow with extensive involvement by CLL, lymphocytes represent85% of all cells  Ibrutinib initiated 04/03/2018  Ibrutinib placed on hold 04/11/2018 due to onset of arthralgias  Ibrutinib resumed 04/16/2018, discontinued 04/25/2018 secondary to severe arthralgias/arthritis  Ibrutinib resumed at a dose of 140 mg daily 05/03/2018  Ibrutinib dose adjusted to 140 mg alternating with 286m 06/25/2018  Ibrutinib discontinued 07/03/2018 secondary to severe arthralgias  Acalabrutinib 08/16/2018, discontinued 11/15/2018 secondary to persistent severe transfusion dependent anemia and neutropenia/thrombocytopenia, last dose 11/14/2018  Bone marrow biopsy 11/21/2018-decreased cellularity, involvement by CLL, decreased erythroid and granulocytic precursors, decreased megakaryocytes  Cycle 1 rituximab 12/06/2018  Bone marrow biopsy 12/24/2018 at [facility]-hypocellular bone marrow (10%) involved by CLL, representing 50% of marrow cellularity; markedly decreased trilineage hematopoiesis including essentially absent erythropoiesis  Bone marrow biopsy 03/06/2027 [facility]-hypocellular bone marrow (20%) with marked reduction of maturing hematopoietic elements; numerous lymphoid aggregates consistent with involvement by CLL, representing approximately 50% of marrow cellularity by flow cytometry  [facility] 03/19/2019 followed by prednisone taper  Eltrombopag beginning 07/05/2019  Cyclosporine discontinued 07/08/2019  Tacrolimus 07/10/2019  2.Hypothyroidism 3.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 Augmentin prescribed. 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 Pancytopenia secondary to CLL and a hypocellular bone marrow  G-CSF and Nplate started 03/02/1750, G-CSF changed to daily beginning 12/17/2018; G-CSF discontinued 12/31/2018, last Nplate 01/15/2019  Began prednisone 60 mg daily 01/11/19, tolerating moderately well except jitteriness, irritability, and difficulty sleeping. Tapered to 40 mg daily x1 week starting 01/24/19, reduce by 10 mg each week until discontinued. Prednisone discontinued 02/20/2019.  Promacta started 01/20/2019, dose increased to 100 mg daily 02/04/2019; dose increased to 150 mg daily 02/21/2019  IVIG daily for 2 days beginning 02/25/2019  9. 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 No lymphadenopathy or splenomegaly. Exjade beginning 06/28/2019. 13. Elevated creatinine-cyclosporine toxicity? Persistent 14.Vaginal bleeding beginning 07/13/2019-Mirena device removed 15.Elevated lithium level 08/08/2019, lithium toxicity?-Lithium held 08/08/2019-08/12/2019, then resumed at a lower dose 16. Deconditioning, secondary to n/v/d 17.  Orthostatic hypotension 09/15/19, received 500 cc NS in clinic   Disposition: Meagan Walsh appears unchanged.  She continues to have pancytopenia.  No transfusion support indicated at this time.  She understands to contact the office with fever, chills, other signs of infection, bleeding.   Exjade placed on hold due to renal dysfunction.  She will continue to focus on oral intake and activity.  She will return for a PICC flush on 09/26/2019.  She will return for lab, follow-up, possible transfusion support on 10/01/2019.  She will contact the office in the interim as outlined above or with any other problems.  Patient seen with Dr. Benay Spice.    Ned Card ANP/GNP-BC   09/23/2019  9:56 AM This was a shared visit with Ned Card.  Meagan Walsh is stable from a hematologic standpoint.  Her performance status appears slightly improved today with less nausea and an increased activity level.  She does not require transfusion support today.  Levaquin has been discontinued and she is now taking a cephalosporin for antibiotic prophylaxis.  We recommended she discontinued Exjade due to the elevated creatinine. She is undergoing evaluation of the nausea and diarrhea by Dr. Carlean Purl I will communicate with the [facility] team regarding the overall treatment plan.  We encouraged her to schedule a dental cleaning.  [name], MD

## 2019-09-23 NOTE — Progress Notes (Signed)
LATE ENTRY-Reported to [place] ANC of 0.2. Critical value was reported from lab and immediately taken to the desk nurse. Gardiner Rhyme, RN

## 2019-09-24 ENCOUNTER — Ambulatory Visit (INDEPENDENT_AMBULATORY_CARE_PROVIDER_SITE_OTHER): Payer: BC Managed Care – PPO | Admitting: Psychiatry

## 2019-09-24 ENCOUNTER — Other Ambulatory Visit: Payer: Self-pay | Admitting: *Deleted

## 2019-09-24 ENCOUNTER — Encounter: Payer: Self-pay | Admitting: Psychiatry

## 2019-09-24 ENCOUNTER — Telehealth: Payer: Self-pay | Admitting: *Deleted

## 2019-09-24 ENCOUNTER — Encounter: Payer: Self-pay | Admitting: *Deleted

## 2019-09-24 DIAGNOSIS — F331 Major depressive disorder, recurrent, moderate: Secondary | ICD-10-CM | POA: Diagnosis not present

## 2019-09-24 DIAGNOSIS — F5105 Insomnia due to other mental disorder: Secondary | ICD-10-CM | POA: Diagnosis not present

## 2019-09-24 DIAGNOSIS — R634 Abnormal weight loss: Secondary | ICD-10-CM

## 2019-09-24 MED ORDER — MIRTAZAPINE 30 MG PO TABS: 30.0000 mg | ORAL_TABLET | Freq: Every day | ORAL | 1 refills | Status: DC

## 2019-09-24 MED ORDER — VORTIOXETINE HBR 10 MG PO TABS: 20.0000 mg | ORAL_TABLET | Freq: Every day | ORAL | 1 refills | Status: DC

## 2019-09-24 NOTE — Progress Notes (Addendum)
Meagan Walsh [id number] 10-30-1960 59 y.o.    Subjective:   Patient ID:  Meagan Walsh is a 59 y.o. (DOB 1961-07-24) female.  Chief Complaint:  Chief Complaint  Patient presents with  . Follow-up    depression  . Stress    health problems  . Nausea    SANAE FILO presents to the office today for follow-up of bipolar depression.  seen in January 2020.  Mood was stable and no meds were changed.   Last seen May 2020.  Lithium level was 0.68.  Mood was stable and no meds were changed.  Discussed her care with her oncologist Dr. Benay Spice in June 2020.  She was dealing with pancytopenia and it was requested that she wean off lamotrigine to see if it was contributing.  That was done.  August 11, 2019 another conversation with Dr. Benay Spice.  She had elevated lithium of 2.1 on August 08, 2019.  She had lost some weight and was having some nausea and vomiting.  Lithium was held for a few days then restarted at 300 mg daily.  Lithium levels have been checked frequently since that time.  Still struggling with  CLL.  Needs bone marrow transplant but hasn't been able to occur yet and will be done in [place] with her living there for 3 mos.  Tired of being in limbo.  Has to gain weight.  Poor appetite and it's hard to do.  Referred to GI doc.  Rare Xanax used as nausea med.  Using Ambien at HS.  Sleep alright.    In spite of stress has not been depressed.  Patient reports stable mood and denies depressed or irritable moods.  Patient denies any recent difficulty with anxiety.  Patient denies difficulty with sleep initiation or maintenance with Ambien. Denies appetite disturbance.  No weight loss.  Patient reports that energy and motivation have been good.  Patient denies any difficulty with concentration.  Patient denies any suicidal ideation.  2 severe episodes depression in life that last a full year.  Multiple failed prior medication trials include but are not limited to the  following: Viibryd, Abilify, Vraylar, Rexulti 0.5 mg caused restlessness, duloxetine, Viibryd, Trintellix 20 mg failed until lithium was added  Review of Systems:  Review of Systems  Gastrointestinal: Positive for nausea and vomiting.  Neurological: Negative for tremors and weakness.  Psychiatric/Behavioral: Negative for agitation, behavioral problems, confusion, decreased concentration, dysphoric mood, hallucinations, self-injury, sleep disturbance and suicidal ideas. The patient is not nervous/anxious and is not hyperactive.     Medications: I have reviewed the patient's current medications.  Current Outpatient Medications  Medication Sig Dispense Refill  . acetaminophen (TYLENOL) 325 MG tablet Take 650 mg by mouth every 6 (six) hours as needed.    . ALPRAZolam (XANAX) 0.25 MG tablet TAKE 1 TO 2 TABLETS BY MOUTH EVERY DAY AS NEEDED FOR ANXIETY 30 tablet 3  . deferasirox (EXJADE) 500 MG disintegrating tablet Take 500 mg by mouth daily before breakfast.    . eltrombopag (PROMACTA) 50 MG tablet Take 3 tablets (150 mg total) by mouth daily. Take on an empty stomach 1 hour before a meal or 2 hours after 90 tablet 2  . folic acid (FOLVITE) 1 MG tablet TAKE 1 TABLET BY MOUTH EVERY DAY 90 tablet 0  . levothyroxine (SYNTHROID) 100 MCG tablet TAKE 1 TABLET BY MOUTH EVERY DAY 30 tablet 2  . lithium 300 MG tablet Take 300 mg by mouth at bedtime.    [name]  LORazepam (ATIVAN) 0.5 MG tablet Take 1 tablet (0.5 mg total) by mouth 2 (two) times daily as needed (prior to tacrolimus dose if needed). 60 tablet 0  . ondansetron (ZOFRAN-ODT) 8 MG disintegrating tablet DISSOLVE 1 TABLET EVERY 8 HOURS AS NEEDED FOR NAUSEA OR VOMITING 30 tablet 1  . posaconazole (NOXAFIL) 100 MG TBEC delayed-release tablet TAKE 3 TABLETS BY MOUTH DAILY 90 tablet 1  . sulfamethoxazole-trimethoprim (BACTRIM DS) 800-160 MG tablet Take 1 tablet by mouth 2 (two) times a week. 16 tablet 5  . tacrolimus (PROGRAF) 1 MG capsule Take 1 mg by mouth  2 (two) times daily.     . valACYclovir (VALTREX) 500 MG tablet Take 500 mg by mouth daily.    [name] vortioxetine HBr (TRINTELLIX) 10 MG TABS tablet Take 2 tablets (20 mg total) by mouth daily. 30 tablet 1  . zolpidem (AMBIEN) 10 MG tablet TAKE 1/2 TO 1 TABLET BY MOUTH AT BEDTIME AS NEEDED FOR INSOMNIA. 30 tablet 2  . mirtazapine (REMERON) 30 MG tablet Take 1 tablet (30 mg total) by mouth at bedtime. 30 tablet 1   No current facility-administered medications for this visit.   Facility-Administered Medications Ordered in Other Visits  Medication Dose Route Frequency Provider Last Rate Last Admin  . 0.9 %  sodium chloride infusion (Manually program via Guardrails IV Fluids)  250 mL Intravenous Once Betsy Coder B, MD      . 0.9 %  sodium chloride infusion (Manually program via Guardrails IV Fluids)  250 mL Intravenous Once Ladell Pier, MD      . famotidine (PEPCID) IVPB 20 mg premix  20 mg Intravenous Once Betsy Coder B, MD      . heparin lock flush 100 unit/mL  250 Units Intracatheter PRN Betsy Coder B, MD      . sodium chloride flush (NS) 0.9 % injection 10 mL  10 mL Intracatheter PRN Ladell Pier, MD   10 mL at 02/21/19 0945  . sodium chloride flush (NS) 0.9 % injection 10 mL  10 mL Intracatheter PRN Ladell Pier, MD      . SUMAtriptan (IMITREX) nasal spray 20 mg  20 mg Nasal Once Ladell Pier, MD        Medication Side Effects: None  Allergies: No Known Allergies  Past Medical History:  Diagnosis Date  . Anxiety   . Cancer ([place])    CLL  . Degenerative disk disease    Neck--chronic pain  . Depression   . Fibroids    Uterine  . 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 Hypothyroidism   . Lymphocytosis   . Mitral valve prolapse   . Mitral valve prolapse   . Thyroid disease    Hypothyroid    Family History  Problem Relation Age of Onset  . Breast cancer Mother   . Prostate cancer Father   . Colon cancer Neg Hx   .  Esophageal cancer Neg Hx   . Pancreatic cancer Neg Hx   . Stomach cancer Neg Hx     Social History   Socioeconomic History  . Marital status: Married    Spouse name: Not on file  . Number of children: Not on file  . Years of education: Not on file  . Highest education level: Not on file  Occupational History  . Not on file  Tobacco Use  . Smoking status: Never Smoker  . Smokeless tobacco: Never Used  Substance  and Sexual Activity  . Alcohol use: Not Currently    Comment: 2 glasses of wine 2 times per week   . Drug use: No  . Sexual activity: Not on file  Other Topics Concern  . Not on file  Social History Narrative   Pt lives in 2 story home with her husband   Has 3 adult children   Masters degree   Works at [employer] Strain:   . Difficulty of Paying Living Expenses: Not on file  Food Insecurity:   . Worried About Charity fundraiser in the Last Year: Not on file  . Ran Out of Food in the Last Year: Not on file  Transportation Needs:   . Lack of Transportation (Medical): Not on file  . Lack of Transportation (Non-Medical): Not on file  Physical Activity:   . Days of Exercise per Week: Not on file  . Minutes of Exercise per Session: Not on file  Stress:   . Feeling of Stress : Not on file  Social Connections:   . Frequency of Communication with Friends and Family: Not on file  . Frequency of Social Gatherings with Friends and Family: Not on file  . Attends Religious Services: Not on file  . Active Member of Clubs or Organizations: Not on file  . Attends Archivist Meetings: Not on file  . Marital Status: Not on file  Intimate Partner Violence:   . Fear of Current or Ex-Partner: Not on file  . Emotionally Abused: Not on file  . Physically Abused: Not on file  . Sexually Abused: Not on file    Past Medical History, Surgical history, Social history, and Family history were reviewed and updated as  appropriate.   Please see review of systems for further details on the patient's review from today.   Objective:   Physical Exam:  There were no vitals taken for this visit.  Physical Exam Constitutional:      General: She is not in acute distress.    Appearance: She is well-developed.  Musculoskeletal:        General: No deformity.  Neurological:     Mental Status: She is alert and oriented to person, place, and time.     Motor: No tremor.     Coordination: Coordination normal.     Gait: Gait normal.  Psychiatric:        Attention and Perception: Attention and perception normal.        Mood and Affect: Mood is not anxious or depressed. Affect is not labile, blunt, angry or inappropriate.        Speech: Speech normal.        Behavior: Behavior normal.        Thought Content: Thought content normal. Thought content does not include homicidal or suicidal ideation. Thought content does not include homicidal or suicidal plan.        Cognition and Memory: Cognition normal.        Judgment: Judgment normal.     Comments: Insight intact. No auditory or visual hallucinations. No delusions.      Lab Review:     Component Value Date/Time   NA 140 09/23/2019 0907   K 4.3 09/23/2019 0907   CL 109 09/23/2019 0907   CO2 22 09/23/2019 0907   GLUCOSE 188 (H) 09/23/2019 0907   BUN 26 (H) 09/23/2019 0907   CREATININE 1.60 (H) 09/23/2019 [id number]  CALCIUM 9.8 09/23/2019 0907   PROT 6.3 (L) 09/23/2019 0907   ALBUMIN 4.2 09/23/2019 0907   AST 18 09/23/2019 0907   ALT 24 09/23/2019 0907   ALKPHOS 119 09/23/2019 0907   BILITOT 0.5 09/23/2019 0907   GFRNONAA 35 (L) 09/23/2019 0907   GFRAA 40 (L) 09/23/2019 0907       Component Value Date/Time   WBC 4.4 09/23/2019 0907   WBC 9.9 03/31/2019 0845   RBC 3.39 (L) 09/23/2019 0907   HGB 9.9 (L) 09/23/2019 0907   HGB 14.0 06/27/2016 1038   HCT 29.8 (L) 09/23/2019 0907   HCT 42.0 06/27/2016 1038   PLT 45 (L) 09/23/2019 0907   PLT 205  06/27/2016 1038   MCV 87.9 09/23/2019 0907   MCV 92.9 06/27/2016 1038   MCH 29.2 09/23/2019 0907   MCHC 33.2 09/23/2019 0907   RDW 15.2 09/23/2019 0907   RDW 12.2 06/27/2016 1038   LYMPHSABS 3.7 09/23/2019 0907   LYMPHSABS 21.5 (H) 06/27/2016 1038   MONOABS 0.3 09/23/2019 0907   MONOABS 0.5 06/27/2016 1038   EOSABS 0.0 09/23/2019 0907   EOSABS 0.2 06/27/2016 1038   BASOSABS 0.0 09/23/2019 0907   BASOSABS 0.1 06/27/2016 1038    Lithium Lvl  Date Value Ref Range Status  09/15/2019 0.98 0.60 - 1.20 mmol/L Final    Comment:    Performed at [facility], [place] [address], [place], [place] [address]     No results found for: PHENYTOIN, PHENOBARB, VALPROATE, CBMZ   .res Assessment: Plan:    Major depressive disorder, recurrent episode, moderate ([name]) - Plan: mirtazapine (REMERON) 30 MG tablet, vortioxetine HBr (TRINTELLIX) 10 MG TABS tablet  Loss of weight - Plan: mirtazapine (REMERON) 30 MG tablet  Insomnia due to mental condition - Plan: mirtazapine (REMERON) 30 MG tablet   Meagan Walsh has had history of 2 very severe prolonged depressions that failed to respond to multiple psychiatric medications but then responded to lithium potentiation.  She has been through the past 2 winters with no difficulty.  She has a very strong seasonal pattern.   Lithium levels have ranged from 0.91-1.14 and 2021 and have been checked weekly.  In view of chronic nausea and weight loss despite med changes consider Trintellix.  Trintellix does cause nausea about 10% of people. It was tolerated in the past. Reduce Trintellix to 10 mg daily. Add mirtazapine 30 mg tablet at night which could help appetite and potentially replace Trintellix. If nausea is not resolved at follow up then stop Trintellix.  Call if depression worsens.  Counseled patient regarding potential benefits, risks, and side effects of lithium to include potential risk of lithium affecting thyroid and renal function.   Discussed need for periodic lab monitoring to determine drug level and to assess for potential adverse effects.  Counseled patient regarding signs and symptoms of lithium toxicity and advised that they notify office immediately or seek urgent medical attention if experiencing these signs and symptoms.  Patient advised to contact office with any questions or concerns.  Note sent to her oncologist and primary care physician about med changes to hopefully reduce nausea.  FU 6 weeks.  Call prn.   Lynder Parents, MD, DFAPA   Please see After Visit Summary for patient specific instructions.  Future Appointments  Date Time Provider [place]  09/26/2019 10:15 AM [facility] FLUSH [facility] None  10/01/2019 11:30 AM [facility] LAB 3 [facility] None  10/01/2019 11:45 AM [facility] FLUSH [facility] None  10/01/2019 12:15  PM Owens Shark, NP [facility] None  10/01/2019  1:15 PM [facility] INFUSION [facility] None    No orders of the defined types were placed in this encounter.     -------------------------------

## 2019-09-24 NOTE — Telephone Encounter (Signed)
Notified patient and husband that Dr. Oretha Ellis at [facility] said it is OK for her to have her COVID vaccine this weekend. Also wants tacrolimus discontinued (could be contributing to her renal issues). Continue promacta. Dr. Clovis Pu has reduced the Trentellix and added Remeron.

## 2019-09-25 ENCOUNTER — Encounter: Payer: Self-pay | Admitting: *Deleted

## 2019-09-25 LAB — STOOL CULTURE
MICRO NUMBER:: 10188607
MICRO NUMBER:: 10188608
MICRO NUMBER:: 10188610
SHIGA RESULT:: NOT DETECTED
SPECIMEN QUALITY:: ADEQUATE
SPECIMEN QUALITY:: ADEQUATE
SPECIMEN QUALITY:: ADEQUATE

## 2019-09-25 LAB — GASTROINTESTINAL PATHOGEN PANEL PCR
C. difficile Tox A/B, PCR: NOT DETECTED
Campylobacter, PCR: NOT DETECTED
Cryptosporidium, PCR: NOT DETECTED
E coli (ETEC) LT/ST PCR: NOT DETECTED
E coli (STEC) stx1/stx2, PCR: NOT DETECTED
E coli 0157, PCR: NOT DETECTED
Giardia lamblia, PCR: NOT DETECTED
Norovirus, PCR: NOT DETECTED
Rotavirus A, PCR: NOT DETECTED
Salmonella, PCR: NOT DETECTED
Shigella, PCR: NOT DETECTED

## 2019-09-25 LAB — TACROLIMUS LEVEL: Tacrolimus (FK506) - LabCorp: 10.9 ng/mL (ref 2.0–20.0)

## 2019-09-25 LAB — FECAL LACTOFERRIN, QUANT
Fecal Lactoferrin: NEGATIVE
MICRO NUMBER:: 10189965
SPECIMEN QUALITY:: ADEQUATE

## 2019-09-25 LAB — OVA AND PARASITE EXAMINATION
CONCENTRATE RESULT:: NONE SEEN
MICRO NUMBER:: 10188609
SPECIMEN QUALITY:: ADEQUATE
TRICHROME RESULT:: NONE SEEN

## 2019-09-25 LAB — CLOSTRIDIUM DIFFICILE TOXIN B, QUALITATIVE, REAL-TIME PCR: Toxigenic C. Difficile by PCR: NOT DETECTED

## 2019-09-25 NOTE — Unmapped (Addendum)
Recent background: On 12/15, [name] was switched from cyclosporine to tacrolimus. This was mainly due to the limitations of nausea/vomiting on her levels and quality of life. If she were to get a transplant she would need to tolerate immunosuppression therapy, and so therefore the decision was made to switch her to tacrolimus.     She was started on 1 mg BID on 12/15. This dose was determined based off a target dose of 0.03 mg/kg BID with a 50% empiric dose reduction for the posaconazole interaction. She continues on 1 mg BID with no dose adjustments needed yet.    Patient's tacrolimus level from 3/2 came back 10.9 ng/mL (goal 5-15).  My recommendation would be that she should continue on tac 1 mg BID.  However, I received notice from her RN at [facility] that she has STOPPED tacrolimus on 3/3. I will follow-up on the reason for this and I will forward this note to our team here, as well as remove tacrolimus Rx from our list if she is to remain off this.     Her SCr is increased to 1.6 this week, but fluctuates frequently. Her LFTs are WNL. CBC continues to show low counts (ANC 0.2, PLT 45, Hgb 9.9). Mg is 1.9 today. Please refer to my note from 3/1 - she should be OFF Exjade due to concern for renal injury, I also provided other recommendations surrounding choice of iron chelation and dosing in the future. The discontinuation of Exjade (a 3A4 inducer) may have resulted in the increased tacrolimus trough, yet she is still therapeutic. If she continues to trend upward, may require a dose reduction particularly in the setting of worsening renal function.       She should remain on ppx with posaconazole indefinitely to avoid stopping/starting which impacts tacrolimus metabolism and levels. She should continue other ppx of cefdinir while ANC < 0.5, and valtrex and Bactrim indefinitely. Should continue to get weekly tacrolimus troughs locally. I will continue to follow.       Manfred Arch, PharmD, BCOP, CPP Pager: [phone]

## 2019-09-25 NOTE — Progress Notes (Signed)
Faxed labs from 09/23/19 to [place] [phone] with notation the Exjade  And Tacrolimu was d/c'd on 09/23/19 as requested.

## 2019-09-26 ENCOUNTER — Other Ambulatory Visit: Payer: Self-pay

## 2019-09-26 ENCOUNTER — Inpatient Hospital Stay: Payer: BC Managed Care – PPO

## 2019-09-26 DIAGNOSIS — Z79899 Other long term (current) drug therapy: Secondary | ICD-10-CM | POA: Diagnosis not present

## 2019-09-26 DIAGNOSIS — R112 Nausea with vomiting, unspecified: Secondary | ICD-10-CM | POA: Diagnosis not present

## 2019-09-26 DIAGNOSIS — R197 Diarrhea, unspecified: Secondary | ICD-10-CM | POA: Diagnosis not present

## 2019-09-26 DIAGNOSIS — D801 Nonfamilial hypogammaglobulinemia: Secondary | ICD-10-CM | POA: Diagnosis not present

## 2019-09-26 DIAGNOSIS — D696 Thrombocytopenia, unspecified: Secondary | ICD-10-CM

## 2019-09-26 DIAGNOSIS — F418 Other specified anxiety disorders: Secondary | ICD-10-CM | POA: Diagnosis not present

## 2019-09-26 DIAGNOSIS — C9110 Chronic lymphocytic leukemia of B-cell type not having achieved remission: Secondary | ICD-10-CM | POA: Diagnosis not present

## 2019-09-26 DIAGNOSIS — N939 Abnormal uterine and vaginal bleeding, unspecified: Secondary | ICD-10-CM | POA: Diagnosis not present

## 2019-09-26 DIAGNOSIS — D61818 Other pancytopenia: Secondary | ICD-10-CM | POA: Diagnosis not present

## 2019-09-26 DIAGNOSIS — M25519 Pain in unspecified shoulder: Secondary | ICD-10-CM | POA: Diagnosis not present

## 2019-09-26 DIAGNOSIS — E039 Hypothyroidism, unspecified: Secondary | ICD-10-CM | POA: Diagnosis not present

## 2019-09-26 DIAGNOSIS — Z452 Encounter for adjustment and management of vascular access device: Secondary | ICD-10-CM | POA: Diagnosis not present

## 2019-09-26 DIAGNOSIS — Z95828 Presence of other vascular implants and grafts: Secondary | ICD-10-CM

## 2019-09-26 DIAGNOSIS — K068 Other specified disorders of gingiva and edentulous alveolar ridge: Secondary | ICD-10-CM | POA: Diagnosis not present

## 2019-09-26 MED ORDER — SODIUM CHLORIDE 0.9% FLUSH
10.0000 mL | INTRAVENOUS | Status: DC | PRN
  Administered 2019-09-26: 10 mL
  Filled 2019-09-26: qty 10

## 2019-09-26 MED ORDER — HEPARIN SOD (PORK) LOCK FLUSH 100 UNIT/ML IV SOLN
500.0000 [IU] | Freq: Once | INTRAVENOUS | Status: AC | PRN
  Administered 2019-09-26: 250 [IU]
  Filled 2019-09-26: qty 5

## 2019-09-26 NOTE — Unmapped (Signed)
I received a fax from [facility] stating that [name] has discontinued her tacrolimus in agreement with her physicians at [facility] due to fear this is contributing to her kidney dysfunction. I have removed the order from her [facility] list, she will no longer receive refills of this from the [facility]. I am signing off on following labs weekly. I ask to be re-involved if/when her CLL therapy is determined. Her local oncologist continue to follow her for prescribing of other specialty medications including posaconazole and promacta.       Manfred Arch, PharmD, BCOP, CPP  Pager: [phone]

## 2019-09-26 NOTE — Patient Instructions (Signed)
PICC Home Care Guide  A peripherally inserted central catheter (PICC) is a form of IV access that allows medicines and IV fluids to be quickly distributed throughout the body. The PICC is a long, thin, flexible tube (catheter) that is inserted into a vein in the upper arm. The catheter ends in a large vein in the chest (superior vena cava, or SVC). After the PICC is inserted, a chest X-ray may be done to make sure that it is in the correct place. A PICC may be placed for different reasons, such as:  To give medicines and liquid nutrition.  To give IV fluids and blood products.  If there is trouble placing a peripheral intravenous (PIV) catheter. If taken care of properly, a PICC can remain in place for several months. Having a PICC can also allow a person to go home from the hospital sooner. Medicine and PICC care can be managed at home by a family member, caregiver, or home health care team. What are the risks? Generally, having a PICC is safe. However, problems may occur, including:  A blood clot (thrombus) forming in or at the tip of the PICC.  A blood clot forming in a vein (deep vein thrombosis) or traveling to the lung (pulmonary embolism).  Inflammation of the vein (phlebitis) in which the PICC is placed.  Infection. Central line associated blood stream infection (CLABSI) is a serious infection that often requires hospitalization.  PICC movement (malposition). The PICC tip may move from its original position due to excessive physical activity, forceful coughing, sneezing, or vomiting.  A break or cut in the PICC. It is important not to use scissors near the PICC.  Nerve or tendon irritation or injury during PICC insertion. How to take care of your PICC Preventing problems  You and any caregivers should wash your hands often with soap. Wash hands: ? Before touching the PICC line or the infusion device. ? Before changing a bandage (dressing).  Flush the PICC as told by your  health care provider. Let your health care provider know right away if the PICC is hard to flush or does not flush. Do not use force to flush the PICC.  Do not use a syringe that is less than 10 mL to flush the PICC.  Avoid blood pressure checks on the arm in which the PICC is placed.  Never pull or tug on the PICC.  Do not take the PICC out yourself. Only a trained clinical professional should remove the PICC.  Use clean and sterile supplies only. Keep the supplies in a dry place. Do not reuse needles, syringes, or any other supplies. Doing that can lead to infection.  Keep pets and children away from your PICC line.  Check the PICC insertion site every day for signs of infection. Check for: ? Leakage. ? Redness, swelling, or pain. ? Fluid or blood. ? Warmth. ? Pus or a bad smell. PICC dressing care  Keep your PICC bandage (dressing) clean and dry to prevent infection.  Do not take baths, swim, or use a hot tub until your health care provider approves. Ask your health care provider if you can take showers. You may only be allowed to take sponge baths for bathing. When you are allowed to shower: ? Ask your health care provider to teach you how to wrap the PICC line. ? Cover the PICC line with clear plastic wrap and tape to keep it dry while showering.  Follow instructions from your health care provider   about how to take care of your insertion site and dressing. Make sure you: ? Wash your hands with soap and water before you change your bandage (dressing). If soap and water are not available, use hand sanitizer. ? Change your dressing as told by your health care provider. ? Leave stitches (sutures), skin glue, or adhesive strips in place. These skin closures may need to stay in place for 2 weeks or longer. If adhesive strip edges start to loosen and curl up, you may trim the loose edges. Do not remove adhesive strips completely unless your health care provider tells you to do  that.  Change your PICC dressing if it becomes loose or wet. General instructions   Carry your PICC identification card or wear a medical alert bracelet at all times.  Keep the tube clamped at all times, unless it is being used.  Carry a smooth-edge clamp with you at all times to place on the tube if it breaks.  Do not use scissors or sharp objects near the tube.  You may bend your arm and move it freely. If your PICC is near or at the bend of your elbow, avoid activity with repeated motion at the elbow.  Avoid lifting heavy objects as told by your health care provider.  Keep all follow-up visits as told by your health care provider. This is important. Disposal of supplies  Throw away any syringes in a disposal container that is meant for sharp items (sharps container). You can buy a sharps container from a pharmacy, or you can make one by using an empty hard plastic bottle with a cover.  Place any used dressings or infusion bags into a plastic bag. Throw that bag in the trash. Contact a health care provider if:  You have pain in your arm, ear, face, or teeth.  You have a fever or chills.  You have redness, swelling, or pain around the insertion site.  You have fluid or blood coming from the insertion site.  Your insertion site feels warm to the touch.  You have pus or a bad smell coming from the insertion site.  Your skin feels hard and raised around the insertion site. Get help right away if:  Your PICC is accidentally pulled all the way out. If this happens, cover the insertion site with a bandage or gauze dressing. Do not throw the PICC away. Your health care provider will need to check it.  Your PICC was tugged or pulled and has partially come out. Do not  push the PICC back in.  You cannot flush the PICC, it is hard to flush, or the PICC leaks around the insertion site when it is flushed.  You hear a "flushing" sound when the PICC is flushed.  You feel your  heart racing or skipping beats.  There is a hole or tear in the PICC.  You have swelling in the arm in which the PICC was inserted.  You have a red streak going up your arm from where the PICC was inserted. Summary  A peripherally inserted central catheter (PICC) is a long, thin, flexible tube (catheter) that is inserted into a vein in the upper arm.  The PICC is inserted using a sterile technique by a specially trained nurse or physician. Only a trained clinical professional should remove it.  Keep your PICC identification card with you at all times.  Avoid blood pressure checks on the arm in which the PICC is placed.  If cared for   properly, a PICC can remain in place for several months. Having a PICC can also allow a person to go home from the hospital sooner. This information is not intended to replace advice given to you by your health care provider. Make sure you discuss any questions you have with your health care provider. Document Revised: 06/22/2017 Document Reviewed: 08/12/2016 Elsevier Patient Education  2020 [employer]  

## 2019-09-30 ENCOUNTER — Other Ambulatory Visit: Payer: Self-pay | Admitting: Oncology

## 2019-09-30 NOTE — Unmapped (Signed)
Tacrolimus shipment has been canceled. Provider discontinued the prescription and Epic states that the patient is no longer taking Tacrolimus. Patient is aware.

## 2019-09-30 NOTE — Unmapped (Signed)
This patient has been disenrolled from the [facility] specialty pharmacy services due to medication discontinuation resulting from side effect intolerance.    Susan Kane  [name]  [facility] Specialty Pharmacist

## 2019-10-01 ENCOUNTER — Telehealth: Payer: Self-pay

## 2019-10-01 ENCOUNTER — Telehealth: Payer: Self-pay | Admitting: Nurse Practitioner

## 2019-10-01 ENCOUNTER — Inpatient Hospital Stay: Payer: BC Managed Care – PPO

## 2019-10-01 ENCOUNTER — Other Ambulatory Visit: Payer: Self-pay

## 2019-10-01 ENCOUNTER — Inpatient Hospital Stay (HOSPITAL_BASED_OUTPATIENT_CLINIC_OR_DEPARTMENT_OTHER): Payer: BC Managed Care – PPO | Admitting: Nurse Practitioner

## 2019-10-01 VITALS — BP 129/67 | HR 94 | Temp 98.7°F | Resp 17 | Ht 66.0 in | Wt 116.7 lb

## 2019-10-01 DIAGNOSIS — R112 Nausea with vomiting, unspecified: Secondary | ICD-10-CM | POA: Diagnosis not present

## 2019-10-01 DIAGNOSIS — C9110 Chronic lymphocytic leukemia of B-cell type not having achieved remission: Secondary | ICD-10-CM

## 2019-10-01 DIAGNOSIS — R197 Diarrhea, unspecified: Secondary | ICD-10-CM | POA: Diagnosis not present

## 2019-10-01 DIAGNOSIS — Z452 Encounter for adjustment and management of vascular access device: Secondary | ICD-10-CM | POA: Diagnosis not present

## 2019-10-01 DIAGNOSIS — D801 Nonfamilial hypogammaglobulinemia: Secondary | ICD-10-CM | POA: Diagnosis not present

## 2019-10-01 DIAGNOSIS — F418 Other specified anxiety disorders: Secondary | ICD-10-CM | POA: Diagnosis not present

## 2019-10-01 DIAGNOSIS — E039 Hypothyroidism, unspecified: Secondary | ICD-10-CM | POA: Diagnosis not present

## 2019-10-01 DIAGNOSIS — D696 Thrombocytopenia, unspecified: Secondary | ICD-10-CM

## 2019-10-01 DIAGNOSIS — K068 Other specified disorders of gingiva and edentulous alveolar ridge: Secondary | ICD-10-CM | POA: Diagnosis not present

## 2019-10-01 DIAGNOSIS — D649 Anemia, unspecified: Secondary | ICD-10-CM | POA: Diagnosis not present

## 2019-10-01 DIAGNOSIS — D61818 Other pancytopenia: Secondary | ICD-10-CM | POA: Diagnosis not present

## 2019-10-01 DIAGNOSIS — Z79899 Other long term (current) drug therapy: Secondary | ICD-10-CM | POA: Diagnosis not present

## 2019-10-01 DIAGNOSIS — N939 Abnormal uterine and vaginal bleeding, unspecified: Secondary | ICD-10-CM | POA: Diagnosis not present

## 2019-10-01 DIAGNOSIS — M25519 Pain in unspecified shoulder: Secondary | ICD-10-CM | POA: Diagnosis not present

## 2019-10-01 DIAGNOSIS — Z95828 Presence of other vascular implants and grafts: Secondary | ICD-10-CM

## 2019-10-01 LAB — CMP (CANCER CENTER ONLY)
ALT: 42 U/L (ref 0–44)
AST: 24 U/L (ref 15–41)
Albumin: 3.8 g/dL (ref 3.5–5.0)
Alkaline Phosphatase: 117 U/L (ref 38–126)
Anion gap: 9 (ref 5–15)
BUN: 18 mg/dL (ref 6–20)
CO2: 23 mmol/L (ref 22–32)
Calcium: 9.3 mg/dL (ref 8.9–10.3)
Chloride: 109 mmol/L (ref 98–111)
Creatinine: 0.9 mg/dL (ref 0.44–1.00)
GFR, Est AFR Am: >60 mL/min (ref >60)
GFR, Estimated: >60 mL/min (ref >60)
Glucose, Bld: 200 mg/dL — ABNORMAL HIGH (ref 70–99)
Potassium: 4.2 mmol/L (ref 3.5–5.1)
Sodium: 141 mmol/L (ref 135–145)
Total Bilirubin: 0.5 mg/dL (ref 0.3–1.2)
Total Protein: 5.7 g/dL — ABNORMAL LOW (ref 6.5–8.1)

## 2019-10-01 LAB — MAGNESIUM: Magnesium: 2 mg/dL (ref 1.7–2.4)

## 2019-10-01 LAB — CBC WITH DIFFERENTIAL (CANCER CENTER ONLY)
Abs Immature Granulocytes: 0 10*3/uL (ref 0.00–0.07)
Basophils Absolute: 0 10*3/uL (ref 0.0–0.1)
Basophils Relative: 0 %
Eosinophils Absolute: 0 10*3/uL (ref 0.0–0.5)
Eosinophils Relative: 2 %
HCT: 24.3 % — ABNORMAL LOW (ref 36.0–46.0)
Hemoglobin: 8.2 g/dL — ABNORMAL LOW (ref 12.0–15.0)
Immature Granulocytes: 0 %
Lymphocytes Relative: 74 %
Lymphs Abs: 0.9 10*3/uL (ref 0.7–4.0)
MCH: 30.9 pg (ref 26.0–34.0)
MCHC: 33.7 g/dL (ref 30.0–36.0)
MCV: 91.7 fL (ref 80.0–100.0)
Monocytes Absolute: 0.2 10*3/uL (ref 0.1–1.0)
Monocytes Relative: 17 %
Neutro Abs: 0.1 10*3/uL — CL (ref 1.7–7.7)
Neutrophils Relative %: 7 %
Platelet Count: 53 10*3/uL — ABNORMAL LOW (ref 150–400)
RBC: 2.65 MIL/uL — ABNORMAL LOW (ref 3.87–5.11)
RDW: 15.9 % — ABNORMAL HIGH (ref 11.5–15.5)
WBC Count: 1.2 10*3/uL — ABNORMAL LOW (ref 4.0–10.5)
nRBC: 0 % (ref 0.0–0.2)

## 2019-10-01 LAB — SAMPLE TO BLOOD BANK

## 2019-10-01 LAB — LITHIUM LEVEL: Lithium Lvl: 0.69 mmol/L (ref 0.60–1.20)

## 2019-10-01 MED ORDER — SODIUM CHLORIDE 0.9% FLUSH
10.0000 mL | INTRAVENOUS | Status: DC | PRN
  Administered 2019-10-01: 10 mL
  Filled 2019-10-01: qty 10

## 2019-10-01 NOTE — Telephone Encounter (Signed)
Received critical lab call for patient. anc 0.1 Manuela Schwartz RN made aware

## 2019-10-01 NOTE — Progress Notes (Signed)
[place] OFFICE PROGRESS NOTE   Diagnosis: CLL, aplastic anemia  INTERVAL HISTORY:   Meagan Walsh returns as scheduled.  She is feeling much better.  There has been marked improvement in the nausea.  She has not vomited in 5 days.  Appetite is better.  Energy and activity level have improved.  She is working with a Physiological scientist 3 times a week.  She continues to have mild vaginal bleeding.  No fever.  Objective:  Vital signs in last 24 hours:  Blood pressure 129/67, pulse 94, temperature 98.7 F (37.1 C), temperature source Temporal, resp. rate 17, height '5\' 6"'  (1.676 m), weight 116 lb 11.2 oz (52.9 kg), SpO2 100 %.    HEENT: Gingival hypertrophy. Lymphatics: No palpable cervical or supraclavicular lymph nodes. Resp: Lungs clear bilaterally. Cardio: Regular rate and rhythm. GI: Abdomen soft and nontender.  No hepatosplenomegaly. Vascular: No leg edema. Neuro: Alert and oriented.   Lab Results:  Lab Results  Component Value Date   WBC 1.2 (L) 10/01/2019   HGB 8.2 (L) 10/01/2019   HCT 24.3 (L) 10/01/2019   MCV 91.7 10/01/2019   PLT 53 (L) 10/01/2019   NEUTROABS PENDING 10/01/2019    Imaging:  No results found.  Medications: I have reviewed the patient's current medications.  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 Mild splenomegaly.  Ultrasound-guided biopsy of the left inguinal lymph node 09/18/2017, slightly "purulent "fluid aspirated, core biopsy is consistent with an atypical lymphoid proliferation-extensive necrosis with surrounding epithelioid histiocytes, limited intact lymphoid tissue involved with CLL  Incisional biopsy of a  necrotic/purulent left inguinal lymph node on 10/01/2017-extensive necrosis with granulomatous inflammation, small amount of viable lymphoid tissue involved with CLL, AFB and fungal stains negative  Peripheral blood FISH analysis 02/05/2018-deletion 13q14, no evidence of p53 (17p13) deletion, no evidence of 11q22deletion  Bone marrow biopsy 02/26/2018-hypercellular marrow with extensive involvement by CLL, lymphocytes represent85% of all cells  Ibrutinib initiated 04/03/2018  Ibrutinib placed on hold 04/11/2018 due to onset of arthralgias  Ibrutinib resumed 04/16/2018, discontinued 04/25/2018 secondary to severe arthralgias/arthritis  Ibrutinib resumed at a dose of 140 mg daily 05/03/2018  Ibrutinib dose adjusted to 140 mg alternating with 281m 06/25/2018  Ibrutinib discontinued 07/03/2018 secondary to severe arthralgias  Acalabrutinib 08/16/2018, discontinued 11/15/2018 secondary to persistent severe transfusion dependent anemia and neutropenia/thrombocytopenia, last dose 11/14/2018  Bone marrow biopsy 11/21/2018-decreased cellularity, involvement by CLL, decreased erythroid and granulocytic precursors, decreased megakaryocytes  Cycle 1 rituximab 12/06/2018  Bone marrow biopsy 12/24/2018 at [facility]-hypocellular bone marrow (10%) involved by CLL, representing 50% of marrow cellularity; markedly decreased trilineage hematopoiesis including essentially absent erythropoiesis  Bone marrow biopsy 03/06/2027 [facility]-hypocellular bone marrow (20%) with marked reduction of maturing hematopoietic elements; numerous lymphoid aggregates consistent with involvement by CLL, representing approximately 50% of marrow cellularity by flow cytometry  [facility] followed by prednisone taper  Eltrombopag beginning 07/05/2019  Cyclosporine discontinued 07/08/2019  Tacrolimus 07/10/2019  2.Hypothyroidism 3.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 Augmentin prescribed. 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 Pancytopenia secondary to CLL and a hypocellular bone marrow  G-CSF and Nplate started 0/17/4944, G-CSF changed to daily beginning 12/17/2018; G-CSF discontinued 12/31/2018, last Nplate 01/15/2019  Began prednisone 60 mg daily 01/11/19, tolerating moderately well except jitteriness, irritability, and difficulty sleeping. Tapered to 40 mg daily x1 week starting 01/24/19, reduce by 10 mg each week until discontinued. Prednisone discontinued 02/20/2019.  Promacta started 01/20/2019, dose increased to 100 mg daily 02/04/2019; dose increased to 150 mg daily 02/21/2019  IVIG daily for 2 days beginning 02/25/2019  9. 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 No lymphadenopathy or splenomegaly. Exjade beginning 06/28/2019. 13. Elevated creatinine-cyclosporine toxicity? Persistent 14.Vaginal bleeding beginning 07/13/2019-Mirena device removed 15.Elevated lithium level 08/08/2019, lithium toxicity?-Lithium held 08/08/2019-08/12/2019, then resumed at a lower dose 16. Deconditioning, secondary to n/v/d 17. Orthostatic hypotension 09/15/19, received 500 cc NS in clinic  Disposition: Meagan Walsh performance status has markedly improved.  We reviewed the CBC from today.  She continues to have pancytopenia.  She understands to contact the office with fever,  chills, other signs of infection, bleeding.  We discussed the hemoglobin of 8.2.  She does not appear significantly symptomatic.  We will hold off on red cell transfusion support for now.  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  She is scheduled to see her dentist next week.  We will consider a referral to a periodontist pending the dental evaluation.  She will return for lab, follow-up, possible red cell transfusion support on 10/06/2019.  She will contact the office in the interim as outlined above or with any other problems.  Patient seen with Dr. Benay Spice.    Ned Card ANP/GNP-BC   10/01/2019  12:41 PM  This was a shared visit with Ned Card.  Meagan Walsh has persistent pancytopenia.  Her performance status has improved significantly over the past week.  It is unclear whether the improved nausea and diarrhea is related to discontinuing Levaquin, tacrolimus, and Exjade.  She has started mirtazapine.  The plan is to continue the current regimen.  She will not receive a transfusion today.  She will follow up with her dentist to evaluate the persistent gum hyperplasia.  I discussed the case with Dr. Royce Macadamia last week.  The plan is to proceed with a restaging evaluation to include CTs and a bone marrow biopsy.  We will then make a decision regarding proceeding with the allogeneic transplant, treatment for CLL, or continuing immunosuppressive therapy.  [name], MD

## 2019-10-01 NOTE — Telephone Encounter (Signed)
Scheduled per los. Patient declined printout  

## 2019-10-05 ENCOUNTER — Other Ambulatory Visit: Payer: Self-pay | Admitting: Oncology

## 2019-10-05 DIAGNOSIS — C9110 Chronic lymphocytic leukemia of B-cell type not having achieved remission: Secondary | ICD-10-CM

## 2019-10-06 ENCOUNTER — Other Ambulatory Visit: Payer: Self-pay

## 2019-10-06 ENCOUNTER — Telehealth: Payer: Self-pay | Admitting: Oncology

## 2019-10-06 ENCOUNTER — Inpatient Hospital Stay (HOSPITAL_BASED_OUTPATIENT_CLINIC_OR_DEPARTMENT_OTHER): Payer: BC Managed Care – PPO | Admitting: Oncology

## 2019-10-06 ENCOUNTER — Inpatient Hospital Stay: Payer: BC Managed Care – PPO

## 2019-10-06 ENCOUNTER — Other Ambulatory Visit: Payer: Self-pay | Admitting: Oncology

## 2019-10-06 ENCOUNTER — Telehealth: Payer: Self-pay

## 2019-10-06 ENCOUNTER — Other Ambulatory Visit: Payer: Self-pay | Admitting: *Deleted

## 2019-10-06 VITALS — BP 117/68 | HR 96 | Temp 98.7°F | Resp 18 | Ht 66.0 in | Wt 118.3 lb

## 2019-10-06 DIAGNOSIS — C9110 Chronic lymphocytic leukemia of B-cell type not having achieved remission: Secondary | ICD-10-CM

## 2019-10-06 DIAGNOSIS — R112 Nausea with vomiting, unspecified: Secondary | ICD-10-CM | POA: Diagnosis not present

## 2019-10-06 DIAGNOSIS — D801 Nonfamilial hypogammaglobulinemia: Secondary | ICD-10-CM | POA: Diagnosis not present

## 2019-10-06 DIAGNOSIS — Z452 Encounter for adjustment and management of vascular access device: Secondary | ICD-10-CM | POA: Diagnosis not present

## 2019-10-06 DIAGNOSIS — D649 Anemia, unspecified: Secondary | ICD-10-CM

## 2019-10-06 DIAGNOSIS — Z79899 Other long term (current) drug therapy: Secondary | ICD-10-CM | POA: Diagnosis not present

## 2019-10-06 DIAGNOSIS — N939 Abnormal uterine and vaginal bleeding, unspecified: Secondary | ICD-10-CM | POA: Diagnosis not present

## 2019-10-06 DIAGNOSIS — F418 Other specified anxiety disorders: Secondary | ICD-10-CM | POA: Diagnosis not present

## 2019-10-06 DIAGNOSIS — K068 Other specified disorders of gingiva and edentulous alveolar ridge: Secondary | ICD-10-CM | POA: Diagnosis not present

## 2019-10-06 DIAGNOSIS — D61818 Other pancytopenia: Secondary | ICD-10-CM | POA: Diagnosis not present

## 2019-10-06 DIAGNOSIS — E039 Hypothyroidism, unspecified: Secondary | ICD-10-CM | POA: Diagnosis not present

## 2019-10-06 DIAGNOSIS — M25519 Pain in unspecified shoulder: Secondary | ICD-10-CM | POA: Diagnosis not present

## 2019-10-06 DIAGNOSIS — R197 Diarrhea, unspecified: Secondary | ICD-10-CM | POA: Diagnosis not present

## 2019-10-06 LAB — SAMPLE TO BLOOD BANK

## 2019-10-06 LAB — CBC WITH DIFFERENTIAL (CANCER CENTER ONLY)
Abs Immature Granulocytes: 0 10*3/uL (ref 0.00–0.07)
Basophils Absolute: 0 10*3/uL (ref 0.0–0.1)
Basophils Relative: 0 %
Eosinophils Absolute: 0 10*3/uL (ref 0.0–0.5)
Eosinophils Relative: 1 %
HCT: 20.8 % — ABNORMAL LOW (ref 36.0–46.0)
Hemoglobin: 7 g/dL — ABNORMAL LOW (ref 12.0–15.0)
Immature Granulocytes: 0 %
Lymphocytes Relative: 80 %
Lymphs Abs: 0.7 10*3/uL (ref 0.7–4.0)
MCH: 30.8 pg (ref 26.0–34.0)
MCHC: 33.7 g/dL (ref 30.0–36.0)
MCV: 91.6 fL (ref 80.0–100.0)
Monocytes Absolute: 0.1 10*3/uL (ref 0.1–1.0)
Monocytes Relative: 13 %
Neutro Abs: 0.1 10*3/uL — CL (ref 1.7–7.7)
Neutrophils Relative %: 6 %
Platelet Count: 40 10*3/uL — ABNORMAL LOW (ref 150–400)
RBC: 2.27 MIL/uL — ABNORMAL LOW (ref 3.87–5.11)
RDW: 15.9 % — ABNORMAL HIGH (ref 11.5–15.5)
WBC Count: 0.8 10*3/uL — CL (ref 4.0–10.5)
nRBC: 0 % (ref 0.0–0.2)

## 2019-10-06 LAB — CMP (CANCER CENTER ONLY)
ALT: 27 U/L (ref 0–44)
AST: 16 U/L (ref 15–41)
Albumin: 3.8 g/dL (ref 3.5–5.0)
Alkaline Phosphatase: 105 U/L (ref 38–126)
Anion gap: 10 (ref 5–15)
BUN: 18 mg/dL (ref 6–20)
CO2: 23 mmol/L (ref 22–32)
Calcium: 9.3 mg/dL (ref 8.9–10.3)
Chloride: 112 mmol/L — ABNORMAL HIGH (ref 98–111)
Creatinine: 0.78 mg/dL (ref 0.44–1.00)
GFR, Est AFR Am: >60 mL/min (ref >60)
GFR, Estimated: >60 mL/min (ref >60)
Glucose, Bld: 132 mg/dL — ABNORMAL HIGH (ref 70–99)
Potassium: 4 mmol/L (ref 3.5–5.1)
Sodium: 145 mmol/L (ref 135–145)
Total Bilirubin: 0.4 mg/dL (ref 0.3–1.2)
Total Protein: 5.8 g/dL — ABNORMAL LOW (ref 6.5–8.1)

## 2019-10-06 LAB — PREPARE RBC (CROSSMATCH)

## 2019-10-06 LAB — MAGNESIUM: Magnesium: 2.2 mg/dL (ref 1.7–2.4)

## 2019-10-06 MED ORDER — ACETAMINOPHEN 325 MG PO TABS: 650.0000 mg | ORAL_TABLET | Freq: Once | ORAL | Status: DC

## 2019-10-06 MED ORDER — DIPHENHYDRAMINE HCL 25 MG PO CAPS: 25.0000 mg | ORAL_CAPSULE | Freq: Once | ORAL | Status: DC

## 2019-10-06 MED ORDER — HEPARIN SOD (PORK) LOCK FLUSH 100 UNIT/ML IV SOLN
250.0000 [IU] | INTRAVENOUS | Status: AC | PRN
  Administered 2019-10-06: 250 [IU]
  Filled 2019-10-06: qty 5

## 2019-10-06 MED ORDER — SODIUM CHLORIDE 0.9% IV SOLUTION
250.0000 mL | Freq: Once | INTRAVENOUS | Status: AC
  Administered 2019-10-06: 250 mL via INTRAVENOUS
  Filled 2019-10-06: qty 250

## 2019-10-06 MED ORDER — SODIUM CHLORIDE 0.9% FLUSH
3.0000 mL | INTRAVENOUS | Status: AC | PRN
  Administered 2019-10-06: 3 mL
  Filled 2019-10-06: qty 10

## 2019-10-06 NOTE — Telephone Encounter (Signed)
Lab called with ANC 0.1. Jasmin, RN notified and will notify the physician.

## 2019-10-06 NOTE — Progress Notes (Signed)
Meagan Walsh   Diagnosis: CLL, aplastic anemia  INTERVAL HISTORY:   Meagan Walsh returns as scheduled.  No fever.  Vaginal bleeding is improved.  She now has only spotting.  No other bleeding.  She continues to have gum hypertrophy.  She is scheduled for a dental cleaning this week.  Nausea and diarrhea have resolved.  Good appetite.  She has been horseback riding 3 times over the past week.  Shoulder pain has improved  She has noted exertional dyspnea and tachycardia for the past few days.  Objective:  Vital signs in last 24 hours:  Blood pressure 117/68, pulse 96, temperature 98.7 F (37.1 C), temperature source Temporal, resp. rate 18, height _0  (1.676 m), weight 118 lb 4.8 oz (53.7 kg), SpO2 100 %.    HEENT: No thrush or bleeding.  Gum hypertrophy Lymphatics: No cervical, supraclavicular, right axillary, or inguinal nodes.  Pea-sized mobile left axillary node Resp: Lungs clear bilaterally Cardio: Regular rate and rhythm GI: No hepatosplenomegaly Vascular: No leg edema    Portacath/PICC-without erythema  Lab Results:  Lab Results  Component Value Date   WBC 0.8 (LL) 10/06/2019   HGB 7.0 (L) 10/06/2019   HCT 20.8 (L) 10/06/2019   MCV 91.6 10/06/2019   PLT 40 (L) 10/06/2019   NEUTROABS PENDING 10/06/2019    CMP  Lab Results  Component Value Date   NA 141 10/01/2019   K 4.2 10/01/2019   CL 109 10/01/2019   CO2 23 10/01/2019   GLUCOSE 200 (H) 10/01/2019   BUN 18 10/01/2019   CREATININE 0.90 10/01/2019   CALCIUM 9.3 10/01/2019   PROT 5.7 (L) 10/01/2019   ALBUMIN 3.8 10/01/2019   AST 24 10/01/2019   ALT 42 10/01/2019   ALKPHOS 117 10/01/2019   BILITOT 0.5 10/01/2019   GFRNONAA >60 10/01/2019   GFRAA >60 10/01/2019     Medications: I have reviewed the patient's current medications.   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 Mild splenomegaly.  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, no evidence of 11q22deletion  Bone marrow biopsy 02/26/2018-hypercellular marrow with extensive involvement by CLL, lymphocytes represent85% of all cells  Ibrutinib initiated 04/03/2018  Ibrutinib placed on hold 04/11/2018 due to onset of arthralgias  Ibrutinib resumed 04/16/2018, discontinued 04/25/2018 secondary to severe arthralgias/arthritis  Ibrutinib resumed at a dose of 140 mg daily 05/03/2018  Ibrutinib dose adjusted to 140 mg alternating with 255m 06/25/2018  Ibrutinib discontinued 07/03/2018 secondary to severe arthralgias  Acalabrutinib 08/16/2018, discontinued 11/15/2018 secondary to persistent severe transfusion dependent anemia and neutropenia/thrombocytopenia, last dose 11/14/2018  Bone marrow biopsy 11/21/2018-decreased cellularity, involvement by CLL, decreased erythroid and granulocytic precursors, decreased megakaryocytes  Cycle 1 rituximab 12/06/2018  Bone marrow biopsy 12/24/2018 at [facility]-hypocellular bone marrow (10%) involved by CLL, representing 50% of marrow cellularity; markedly decreased trilineage hematopoiesis including essentially absent  erythropoiesis  Bone  marrow biopsy 03/06/2027 [facility]-hypocellular bone marrow (20%) with marked reduction of maturing hematopoietic elements; numerous lymphoid aggregates consistent with involvement by CLL, representing approximately 50% of marrow cellularity by flow cytometry  ATG/cyclosporine at [facility] beginning 03/19/2019 followed by prednisone taper  Eltrombopag beginning 07/05/2019  Cyclosporine discontinued 07/08/2019  Tacrolimus 07/10/2019, discontinued 09/24/2019  2.Hypothyroidism 3.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 Augmentin prescribed. 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 Pancytopenia secondary to CLL and a hypocellular bone marrow  G-CSF and Nplate started 04/16/4143, G-CSF changed to daily beginning 12/17/2018; G-CSF discontinued 12/31/2018, last Nplate 01/15/2019  Began prednisone 60 mg daily 01/11/19, tolerating moderately well except jitteriness, irritability, and difficulty sleeping. Tapered to 40 mg daily x1 week starting 01/24/19, reduce by 10 mg each week until discontinued. Prednisone discontinued 02/20/2019.  Promacta started 01/20/2019, dose increased to 100 mg daily 02/04/2019; dose increased to 150 mg daily 02/21/2019  IVIG daily for 2 days beginning 02/25/2019  9. 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 No lymphadenopathy or  splenomegaly. Exjade beginning 06/28/2019. 13. Elevated creatinine-cyclosporine toxicity? Resolved. 14.Vaginal bleeding beginning 07/13/2019-Mirena device removed 15.Elevated lithium level 08/08/2019, lithium toxicity?-Lithium held 08/08/2019-08/12/2019, then resumed at a lower dose 16. Deconditioning, secondary to n/v/d 17. Orthostatic hypotension 09/15/19, received 500 cc NS in clinic    Disposition: Meagan Walsh has persistent severe pancytopenia.  Her performance status is much improved over the past 2 weeks.  Nausea and diarrhea have resolved.  She has an improved appetite and activity level.  It is unclear whether this was related to one of the many changes in her medical regimen over the past 2 weeks.  She discontinue tacrolimus, Levaquin, and Exjade.  Trental lacks was tapered and she began mirtazapine.  The plan is to continue the current medical regimen.  She will undergo a restaging bone marrow biopsy at [facility] tomorrow.  She will undergo restaging CTs this week.  She is scheduled for  appointments at [facility] on 10/13/2019 and 10/15/2019.  She will return for an office visit here on 10/17/2019.  Meagan Walsh has received the first COVID-19 vaccine.  She will call for a fever.  She has symptomatic anemia today.  She will be transfused with 1 unit of packed red blood cells.  Betsy Coder, MD  10/06/2019  11:38 AM

## 2019-10-06 NOTE — Telephone Encounter (Signed)
Scheduled per los. Patient declined printout  

## 2019-10-06 NOTE — Patient Instructions (Signed)

## 2019-10-06 NOTE — Progress Notes (Signed)
Received critical lcall on patient. White count 0.8 Dr Benay Spice aware

## 2019-10-07 ENCOUNTER — Ambulatory Visit: Admit: 2019-10-07 | Discharge: 2019-10-08 | Payer: PRIVATE HEALTH INSURANCE

## 2019-10-07 ENCOUNTER — Encounter: Admit: 2019-10-07 | Discharge: 2019-10-08 | Payer: PRIVATE HEALTH INSURANCE

## 2019-10-07 ENCOUNTER — Encounter: Payer: Self-pay | Admitting: *Deleted

## 2019-10-07 DIAGNOSIS — C9110 Chronic lymphocytic leukemia of B-cell type not having achieved remission: Secondary | ICD-10-CM | POA: Diagnosis not present

## 2019-10-07 LAB — TYPE AND SCREEN
ABO/RH(D): O POS
Antibody Screen: NEGATIVE
Unit division: 0

## 2019-10-07 LAB — BPAM RBC
Blood Product Expiration Date: 202104122359
ISSUE DATE / TIME: 202103151321
Unit Type and Rh: 5100

## 2019-10-07 NOTE — Progress Notes (Signed)
Received refill request for lorazepam 0.5 mg from pharmacy. MD declines to refill since she has xanax prn. Does not want patient taking both.

## 2019-10-09 MED FILL — PROMACTA 50 MG TABLET: 50 | 30 days supply | Qty: 90 | Fill #0

## 2019-10-10 ENCOUNTER — Telehealth: Payer: Self-pay | Admitting: *Deleted

## 2019-10-10 ENCOUNTER — Other Ambulatory Visit: Payer: Self-pay

## 2019-10-10 ENCOUNTER — Ambulatory Visit (HOSPITAL_COMMUNITY)
Admission: RE | Admit: 2019-10-10 | Discharge: 2019-10-10 | Disposition: A | Discharge status: Home / Self Care | Payer: BC Managed Care – PPO | Source: Ambulatory Visit | Attending: Nurse Practitioner | Admitting: Nurse Practitioner

## 2019-10-10 DIAGNOSIS — D259 Leiomyoma of uterus, unspecified: Secondary | ICD-10-CM | POA: Diagnosis not present

## 2019-10-10 DIAGNOSIS — D649 Anemia, unspecified: Secondary | ICD-10-CM | POA: Diagnosis not present

## 2019-10-10 DIAGNOSIS — C9110 Chronic lymphocytic leukemia of B-cell type not having achieved remission: Secondary | ICD-10-CM | POA: Insufficient documentation

## 2019-10-10 NOTE — Telephone Encounter (Signed)
Per Dr. Benay Spice, called to make pt aware that her CT showed no evidence of active CLL, but appears to have uterine fibroids and will need to f/u with gyn for persistent bleeding. Pt verbalized understanding

## 2019-10-10 NOTE — Telephone Encounter (Signed)
-----   Message from Ladell Pier, MD sent at 10/10/2019  1:09 PM EDT ----- Please call patient, CTs show no evidence of active CLL, appears to have uterine fibroids- will need to f/u with gyn for persistent bleeding

## 2019-10-13 ENCOUNTER — Encounter
Admit: 2019-10-13 | Discharge: 2019-10-13 | Payer: PRIVATE HEALTH INSURANCE | Attending: Internal Medicine | Primary: Internal Medicine

## 2019-10-13 ENCOUNTER — Telehealth: Payer: Self-pay

## 2019-10-13 ENCOUNTER — Other Ambulatory Visit: Payer: Self-pay

## 2019-10-13 DIAGNOSIS — D649 Anemia, unspecified: Secondary | ICD-10-CM

## 2019-10-13 DIAGNOSIS — Z79890 Hormone replacement therapy (postmenopausal): Secondary | ICD-10-CM | POA: Diagnosis not present

## 2019-10-13 DIAGNOSIS — C9110 Chronic lymphocytic leukemia of B-cell type not having achieved remission: Secondary | ICD-10-CM | POA: Diagnosis not present

## 2019-10-13 DIAGNOSIS — Z6820 Body mass index (BMI) 20.0-20.9, adult: Secondary | ICD-10-CM | POA: Diagnosis not present

## 2019-10-13 DIAGNOSIS — J189 Pneumonia, unspecified organism: Secondary | ICD-10-CM | POA: Diagnosis not present

## 2019-10-13 DIAGNOSIS — R161 Splenomegaly, not elsewhere classified: Secondary | ICD-10-CM | POA: Diagnosis not present

## 2019-10-13 DIAGNOSIS — Z79899 Other long term (current) drug therapy: Secondary | ICD-10-CM | POA: Diagnosis not present

## 2019-10-13 DIAGNOSIS — D259 Leiomyoma of uterus, unspecified: Secondary | ICD-10-CM | POA: Diagnosis not present

## 2019-10-13 DIAGNOSIS — R110 Nausea: Secondary | ICD-10-CM | POA: Diagnosis not present

## 2019-10-13 DIAGNOSIS — D619 Aplastic anemia, unspecified: Secondary | ICD-10-CM | POA: Diagnosis not present

## 2019-10-13 DIAGNOSIS — Z792 Long term (current) use of antibiotics: Secondary | ICD-10-CM | POA: Diagnosis not present

## 2019-10-13 DIAGNOSIS — Z9221 Personal history of antineoplastic chemotherapy: Secondary | ICD-10-CM | POA: Diagnosis not present

## 2019-10-13 DIAGNOSIS — R590 Localized enlarged lymph nodes: Secondary | ICD-10-CM | POA: Diagnosis not present

## 2019-10-13 DIAGNOSIS — I700 Atherosclerosis of aorta: Secondary | ICD-10-CM | POA: Diagnosis not present

## 2019-10-13 DIAGNOSIS — E039 Hypothyroidism, unspecified: Secondary | ICD-10-CM | POA: Diagnosis not present

## 2019-10-13 DIAGNOSIS — D801 Nonfamilial hypogammaglobulinemia: Secondary | ICD-10-CM | POA: Diagnosis not present

## 2019-10-13 DIAGNOSIS — Q998 Other specified chromosome abnormalities: Secondary | ICD-10-CM | POA: Diagnosis not present

## 2019-10-13 NOTE — Telephone Encounter (Signed)
Spoke with patient regarding her appointments set up for tomorrow (3/23) per MD Benay Spice. Pt verbalizes understanding and agrees with plan of care

## 2019-10-14 ENCOUNTER — Inpatient Hospital Stay: Payer: BC Managed Care – PPO

## 2019-10-14 ENCOUNTER — Other Ambulatory Visit: Payer: Self-pay | Admitting: Oncology

## 2019-10-14 ENCOUNTER — Inpatient Hospital Stay (HOSPITAL_BASED_OUTPATIENT_CLINIC_OR_DEPARTMENT_OTHER): Payer: BC Managed Care – PPO | Admitting: Nurse Practitioner

## 2019-10-14 ENCOUNTER — Other Ambulatory Visit: Payer: Self-pay

## 2019-10-14 ENCOUNTER — Encounter: Payer: Self-pay | Admitting: Nurse Practitioner

## 2019-10-14 VITALS — BP 93/79 | HR 91 | Temp 98.7°F | Resp 16 | Ht 66.0 in | Wt 121.5 lb

## 2019-10-14 DIAGNOSIS — D801 Nonfamilial hypogammaglobulinemia: Secondary | ICD-10-CM | POA: Diagnosis not present

## 2019-10-14 DIAGNOSIS — E039 Hypothyroidism, unspecified: Secondary | ICD-10-CM | POA: Diagnosis not present

## 2019-10-14 DIAGNOSIS — C9110 Chronic lymphocytic leukemia of B-cell type not having achieved remission: Secondary | ICD-10-CM | POA: Diagnosis not present

## 2019-10-14 DIAGNOSIS — K068 Other specified disorders of gingiva and edentulous alveolar ridge: Secondary | ICD-10-CM | POA: Diagnosis not present

## 2019-10-14 DIAGNOSIS — R112 Nausea with vomiting, unspecified: Secondary | ICD-10-CM | POA: Diagnosis not present

## 2019-10-14 DIAGNOSIS — F418 Other specified anxiety disorders: Secondary | ICD-10-CM | POA: Diagnosis not present

## 2019-10-14 DIAGNOSIS — D649 Anemia, unspecified: Secondary | ICD-10-CM

## 2019-10-14 DIAGNOSIS — R197 Diarrhea, unspecified: Secondary | ICD-10-CM | POA: Diagnosis not present

## 2019-10-14 DIAGNOSIS — N939 Abnormal uterine and vaginal bleeding, unspecified: Secondary | ICD-10-CM | POA: Diagnosis not present

## 2019-10-14 DIAGNOSIS — M25519 Pain in unspecified shoulder: Secondary | ICD-10-CM | POA: Diagnosis not present

## 2019-10-14 DIAGNOSIS — Z452 Encounter for adjustment and management of vascular access device: Secondary | ICD-10-CM | POA: Diagnosis not present

## 2019-10-14 DIAGNOSIS — D61818 Other pancytopenia: Secondary | ICD-10-CM | POA: Diagnosis not present

## 2019-10-14 DIAGNOSIS — Z79899 Other long term (current) drug therapy: Secondary | ICD-10-CM | POA: Diagnosis not present

## 2019-10-14 LAB — CBC WITH DIFFERENTIAL (CANCER CENTER ONLY)
Abs Immature Granulocytes: 0 10*3/uL (ref 0.00–0.07)
Basophils Absolute: 0 10*3/uL (ref 0.0–0.1)
Basophils Relative: 1 %
Eosinophils Absolute: 0 10*3/uL (ref 0.0–0.5)
Eosinophils Relative: 2 %
HCT: 21.9 % — ABNORMAL LOW (ref 36.0–46.0)
Hemoglobin: 7.3 g/dL — ABNORMAL LOW (ref 12.0–15.0)
Immature Granulocytes: 0 %
Lymphocytes Relative: 82 %
Lymphs Abs: 1.1 10*3/uL (ref 0.7–4.0)
MCH: 29.9 pg (ref 26.0–34.0)
MCHC: 33.3 g/dL (ref 30.0–36.0)
MCV: 89.8 fL (ref 80.0–100.0)
Monocytes Absolute: 0.2 10*3/uL (ref 0.1–1.0)
Monocytes Relative: 14 %
Neutro Abs: 0 10*3/uL — CL (ref 1.7–7.7)
Neutrophils Relative %: 1 %
Platelet Count: 33 10*3/uL — ABNORMAL LOW (ref 150–400)
RBC: 2.44 MIL/uL — ABNORMAL LOW (ref 3.87–5.11)
RDW: 15.9 % — ABNORMAL HIGH (ref 11.5–15.5)
WBC Count: 1.3 10*3/uL — ABNORMAL LOW (ref 4.0–10.5)
nRBC: 0 % (ref 0.0–0.2)

## 2019-10-14 LAB — CMP (CANCER CENTER ONLY)
ALT: 45 U/L — ABNORMAL HIGH (ref 0–44)
AST: 25 U/L (ref 15–41)
Albumin: 3.9 g/dL (ref 3.5–5.0)
Alkaline Phosphatase: 113 U/L (ref 38–126)
Anion gap: 8 (ref 5–15)
BUN: 23 mg/dL — ABNORMAL HIGH (ref 6–20)
CO2: 25 mmol/L (ref 22–32)
Calcium: 9.4 mg/dL (ref 8.9–10.3)
Chloride: 109 mmol/L (ref 98–111)
Creatinine: 0.81 mg/dL (ref 0.44–1.00)
GFR, Est AFR Am: >60 mL/min (ref >60)
GFR, Estimated: >60 mL/min (ref >60)
Glucose, Bld: 116 mg/dL — ABNORMAL HIGH (ref 70–99)
Potassium: 4.5 mmol/L (ref 3.5–5.1)
Sodium: 142 mmol/L (ref 135–145)
Total Bilirubin: 0.4 mg/dL (ref 0.3–1.2)
Total Protein: 6.2 g/dL — ABNORMAL LOW (ref 6.5–8.1)

## 2019-10-14 LAB — SAMPLE TO BLOOD BANK

## 2019-10-14 LAB — PREPARE RBC (CROSSMATCH)

## 2019-10-14 MED ORDER — SODIUM CHLORIDE 0.9% IV SOLUTION
250.0000 mL | Freq: Once | INTRAVENOUS | Status: AC
  Administered 2019-10-14: 250 mL via INTRAVENOUS
  Filled 2019-10-14: qty 250

## 2019-10-14 MED ORDER — HEPARIN SOD (PORK) LOCK FLUSH 100 UNIT/ML IV SOLN
250.0000 [IU] | INTRAVENOUS | Status: AC | PRN
  Administered 2019-10-14: 250 [IU]
  Filled 2019-10-14: qty 5

## 2019-10-14 MED ORDER — SODIUM CHLORIDE 0.9% FLUSH
10.0000 mL | INTRAVENOUS | Status: AC | PRN
  Administered 2019-10-14: 10 mL
  Filled 2019-10-14: qty 10

## 2019-10-14 MED ORDER — SODIUM CHLORIDE 0.9% FLUSH
3.0000 mL | INTRAVENOUS | Status: DC | PRN
  Filled 2019-10-14: qty 10

## 2019-10-14 NOTE — Patient Instructions (Signed)

## 2019-10-14 NOTE — Progress Notes (Addendum)
[place]   Telephone:([phone] [phone]   Clinic Follow up Note   Patient Care Team: Lavone Orn, MD as PCP - General (Internal Medicine) Linda Hedges, DO as Consulting Physician (Obstetrics and Gynecology) Clovis Pu, Billey Co., MD as Attending Physician (Psychiatry) 10/14/2019  CHIEF COMPLAINT: F/u CLL, aplastic anemia   INTERVAL HISTORY: Ms. Marines returns for f/u as scheduled. She was last seen by Dr. Benay Spice on 3/15 and [facility] on 3/22. Bone marrow site is a little sore. Otherwise she is doing very well. Continues to gain weight. Activity level increased. She continues with trainer 3 times per week and therapeutic horseback riding twice per week. She takes prophylactic anti-emetics, no n/v. Stools are soft which is her baseline. Vaginal bleeding has nearly resolved, "less than spotting." She is waiting on call back from Gyn. She has mild DOE, no cough, chest pain, positional dizziness. Denies pain. She had dental cleaning recently. She will return to [facility] on 3/24.    MEDICAL HISTORY:  Past Medical History:  Diagnosis Date  . Anxiety   . Cancer ([place])    CLL  . Degenerative disk disease    Neck--chronic pain  . Depression   . Fibroids    Uterine  . 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 Hypothyroidism   . Lymphocytosis   . Mitral valve prolapse   . Mitral valve prolapse   . Thyroid disease    Hypothyroid    SURGICAL HISTORY: Past Surgical History:  Procedure Laterality Date  . BREAST BIOPSY Left 2000  . CESAREAN SECTION  1996  . MELANOMA EXCISION WITH SENTINEL LYMPH NODE BIOPSY Left 10/01/2017   Procedure: EXCISIONAL BIOPSY OF LEFT INGUINAL LYMPH NODE;  Surgeon: Greer Pickerel, MD;  Location: [facility];  Service: General;  Laterality: Left;  . REVISION OF ABDOMINAL SCAR    . TONSILLECTOMY     age 59    I have reviewed the social history and family history with the patient and they are unchanged from previous  note.  ALLERGIES:  has No Known Allergies.  MEDICATIONS:  Current Outpatient Medications  Medication Sig Dispense Refill  . acetaminophen (TYLENOL) 325 MG tablet Take 650 mg by mouth every 6 (six) hours as needed.    . ALPRAZolam (XANAX) 0.25 MG tablet TAKE 1 TO 2 TABLETS BY MOUTH EVERY DAY AS NEEDED FOR ANXIETY 30 tablet 3  . cefdinir (OMNICEF) 300 MG capsule Take 300 mg by mouth daily.    . folic acid (FOLVITE) 1 MG tablet TAKE 1 TABLET BY MOUTH EVERY DAY 90 tablet 0  . levothyroxine (SYNTHROID) 100 MCG tablet TAKE 1 TABLET BY MOUTH EVERY DAY 30 tablet 2  . lithium 300 MG tablet Take 300 mg by mouth at bedtime.    [name] LORazepam (ATIVAN) 0.5 MG tablet Take 1 tablet (0.5 mg total) by mouth 2 (two) times daily as needed (prior to tacrolimus dose if needed). 60 tablet 0  . mirtazapine (REMERON) 30 MG tablet Take 1 tablet (30 mg total) by mouth at bedtime. 30 tablet 1  . ondansetron (ZOFRAN-ODT) 8 MG disintegrating tablet DISSOLVE 1 TABLET EVERY 8 HOURS AS NEEDED FOR NAUSEA OR VOMITING 30 tablet 1  . posaconazole (NOXAFIL) 100 MG TBEC delayed-release tablet TAKE 3 TABLETS BY MOUTH EVERY DAY 90 tablet 1  . PROMACTA 50 MG tablet TAKE 3 TABLETS (150 MG TOTAL) BY MOUTH DAILY. TAKE ON AN EMPTY STOMACH 1 HOUR BEFORE A MEAL OR 2  HOURS AFTER 90 tablet 2  . sulfamethoxazole-trimethoprim (BACTRIM DS) 800-160 MG tablet Take 1 tablet by mouth 2 (two) times a week. 16 tablet 5  . valACYclovir (VALTREX) 500 MG tablet Take 500 mg by mouth daily.    [name] vortioxetine HBr (TRINTELLIX) 10 MG TABS tablet Take 2 tablets (20 mg total) by mouth daily. 30 tablet 1  . zolpidem (AMBIEN) 10 MG tablet TAKE 1/2 TO 1 TABLET BY MOUTH AT BEDTIME AS NEEDED FOR INSOMNIA. 30 tablet 2   No current facility-administered medications for this visit.   Facility-Administered Medications Ordered in Other Visits  Medication Dose Route Frequency Provider Last Rate Last Admin  . 0.9 %  sodium chloride infusion (Manually program via  Guardrails IV Fluids)  250 mL Intravenous Once Betsy Coder B, MD      . 0.9 %  sodium chloride infusion (Manually program via Guardrails IV Fluids)  250 mL Intravenous Once Ladell Pier, MD      . famotidine (PEPCID) IVPB 20 mg premix  20 mg Intravenous Once Betsy Coder B, MD      . heparin lock flush 100 unit/mL  250 Units Intracatheter PRN Betsy Coder B, MD      . sodium chloride flush (NS) 0.9 % injection 10 mL  10 mL Intracatheter PRN Ladell Pier, MD   10 mL at 02/21/19 0945  . sodium chloride flush (NS) 0.9 % injection 10 mL  10 mL Intracatheter PRN Ladell Pier, MD      . sodium chloride flush (NS) 0.9 % injection 10 mL  10 mL Intracatheter PRN Ladell Pier, MD      . sodium chloride flush (NS) 0.9 % injection 3 mL  3 mL Intracatheter PRN Ladell Pier, MD      . SUMAtriptan (IMITREX) nasal spray 20 mg  20 mg Nasal Once Ladell Pier, MD        PHYSICAL EXAMINATION: ECOG PERFORMANCE STATUS: 1 - Symptomatic but completely ambulatory  Vitals:   10/14/19 1116  BP: 93/79  Pulse: 91  Resp: 16  Temp: 98.7 F (37.1 C)  SpO2: 99%   Filed Weights   10/14/19 1116  Weight: 121 lb 8 oz (55.1 kg)   Limited exam for COVID19 pandemic, and patient had no concerns  GENERAL:alert, no distress and comfortable SKIN: no rash. Right iliac puncture site with mild erythema, no drainage.  EYES:  sclera clear OROPHARYNX: gingival hypertrophy, most prominent at right upper gumline LUNGS:  normal breathing effort MSK: no lower extremity edema NEURO: alert & oriented x 3 with fluent speech, normal gait PICC without erythema   LABORATORY DATA:  I have reviewed the data as listed CBC Latest Ref Rng & Units 10/14/2019 10/06/2019 10/01/2019  WBC 4.0 - 10.5 K/uL 1.3(L) 0.8(LL) 1.2(L)  Hemoglobin 12.0 - 15.0 g/dL 7.3(L) 7.0(L) 8.2(L)  Hematocrit 36.0 - 46.0 % 21.9(L) 20.8(L) 24.3(L)  Platelets 150 - 400 K/uL 33(L) 40(L) 53(L)     CMP Latest Ref Rng & Units 10/14/2019  10/06/2019 10/01/2019  Glucose 70 - 99 mg/dL 116(H) 132(H) 200(H)  BUN 6 - 20 mg/dL 23(H) 18 18  Creatinine 0.44 - 1.00 mg/dL 0.81 0.78 0.90  Sodium 135 - 145 mmol/L 142 145 141  Potassium 3.5 - 5.1 mmol/L 4.5 4.0 4.2  Chloride 98 - 111 mmol/L 109 112(H) 109  CO2 22 - 32 mmol/L _0 Calcium 8.9 - 10.3 mg/dL 9.4 9.3 9.3  Total Protein 6.5 - 8.1 g/dL  6.2(L) 5.8(L) 5.7(L)  Total Bilirubin 0.3 - 1.2 mg/dL 0.4 0.4 0.5  Alkaline Phos 38 - 126 U/L 113 105 117  AST 15 - 41 U/L _0 ALT 0 - 44 U/L 45(H) 27 42      RADIOGRAPHIC STUDIES: I have personally reviewed the radiological images as listed and agreed with the findings in the report. No results found.   ASSESSMENT & PLAN:   1.CLL-diagnosed in August 2010, flow cytometry consistent with CLL Enlarged leftinguinal lymph node January 2019,smallneck/axillary nodes and palpable splenomegaly 09/11/2017  CTson 09/17/2017-3 cm necrotic appearing lymph node in the left inguinal region, borderline enlarged pelvic/retroperitoneal, chest, and axillary nodes. Mild splenomegaly.  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, no evidence of 11q22deletion  Bone marrow biopsy 02/26/2018-hypercellular marrow with extensive involvement by CLL, lymphocytes represent85% of all cells  Ibrutinib initiated 04/03/2018  Ibrutinib placed on hold 04/11/2018 due to onset of arthralgias  Ibrutinib resumed  04/16/2018, discontinued 04/25/2018 secondary to severe arthralgias/arthritis  Ibrutinib resumed at a dose of 140 mg daily 05/03/2018  Ibrutinib dose adjusted to 140 mg alternating with 273m 06/25/2018  Ibrutinib discontinued 07/03/2018 secondary to severe arthralgias  Acalabrutinib 08/16/2018, discontinued 11/15/2018 secondary to persistent severe transfusion dependent anemia and neutropenia/thrombocytopenia, last dose 11/14/2018  Bone marrow biopsy 11/21/2018-decreased cellularity, involvement by CLL, decreased erythroid and granulocytic precursors, decreased megakaryocytes  Cycle 1 rituximab 12/06/2018  Bone marrow biopsy 12/24/2018 at [facility]-hypocellular bone marrow (10%) involved by CLL, representing 50% of marrow cellularity; markedly decreased trilineage hematopoiesis including essentially absent erythropoiesis  Bone marrow biopsy 03/06/2027 [facility]-hypocellular bone marrow (20%) with marked reduction of maturing hematopoietic elements; numerous lymphoid aggregates consistent with involvement by CLL, representing approximately 50% of marrow cellularity by flow cytometry  [facility] followed by prednisone taper  Eltrombopag beginning 07/05/2019  Cyclosporine discontinued 07/08/2019  Tacrolimus 07/10/2019, discontinued 09/24/2019  [facility] (<5% cellular) essentially devoid of hematopoietic elements; Persistent chronic lymphocytic leukemia, representing 23% of marrow cellularity by flow cytometric analysis; Routine cytogenetic analysis is pending.  CT CAP 10/10/2019 showed no acute process or evidence of active lymphoma/leukemia within  the chest, abdomen, or pelvis. Resection or resolution of previously described necrotic left  inguinal node.   2.Hypothyroidism 3.Hepatitis B surface and core antibody positive  Hepatitis B surface antigen negative and hepatitis B core antibody -12/02/2018 4.Left lung  pneumonia diagnosed 10/08/2017-completed 7 days of Levaquin 5.Left lung pneumoniaon chest x-ray 12/27/2017. Augmentin prescribed. 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 Pancytopenia secondary to CLL and a hypocellular bone marrow  G-CSF and Nplate started 54/33/2951 G-CSF changed to daily beginning 12/17/2018; G-CSF discontinued 12/31/2018, last Nplate 01/15/2019  Began prednisone 60 mg daily 01/11/19, tolerating moderately well except jitteriness, irritability, and difficulty sleeping. Tapered to 40 mg daily x1 week starting 01/24/19, reduce by 10 mg each week until discontinued. Prednisone discontinued 02/20/2019.  Promacta started 01/20/2019, dose increased to 100 mg daily 02/04/2019;  dose increased to 150 mg daily 02/21/2019  IVIG daily for 2 days beginning 02/25/2019  9. 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 No lymphadenopathy or splenomegaly. Exjade beginning 06/28/2019. 13. Elevated creatinine-cyclosporine toxicity? Resolved. 14.Vaginal bleeding beginning 07/13/2019-Mirena device removed, uterine fibroids and possible endometrial fluid or thickening in the fundus per CT CAP on 10/10/19 15.Elevated lithium level 08/08/2019, lithium toxicity?-Lithium held 08/08/2019-08/12/2019, then resumed at a lower dose 16. Deconditioning, secondary to n/v/d 17. Orthostatic hypotension 09/15/19,received500 cc NS in clinic  Disposition:  Ms. Haggar appears improved. She continues to have persistent severe pancytopenia. We reviewed her CBC and CMP from today. Hgb 7.3, she will  receive 1 unit RBCs. PLT 33, last platelet transfusion 07/12/2019. ANC 0.0.   We reviewed her restaging work up which shows no evidence of active CLL in the chest, abdomen, or pelvis. There are uterine fibroids, she has called her Gyn to f/u; vaginal bleeding nearly resolved. Bone marrow biposy from 3/16 at [facility] shows hypocellular marrow (<5% cellularity) and persistent CLL representing 23% of marrow cellularity (down from 50%).   The plan is to continue transplant proceedings at [facility] while we continue supportive care, antimicrobials, promacta at 150 mg daily, and transfusion support PRN. She continues to wean Trintellix. Next visit with [facility] Dr. Oretha Ellis on 10/15/19; She will return here for weekly lab and f/u. The patient was seen with Dr. Benay Spice today.    Orders Placed This Encounter  Procedures  . CBC with Differential (Cancer Center Only)    Standing Status:   Future    Standing Expiration Date:   10/13/2020  . Sample to Blood Bank    Standing Status:   Future    Standing Expiration Date:   10/13/2020   All questions were answered. The patient knows to call the clinic with any problems, questions or concerns. No barriers to learning were detected.     Alla Feeling, NP 10/14/19  This was a shared visit with Cira Rue.  Ms. Emami continues to have an improved performance status.  She has persistent severe pancytopenia.  She will receive 1 unit of packed red blood cells today.  I discussed the case with Dr. Royce Macadamia yesterday.  The bone marrow is hypocellular.  The restaging CTs show no evidence of active CLL.  The plan is to proceed with allogeneic bone marrow transplant.  She is seeing Dr. Oretha Ellis tomorrow.  We will refer her to a periodontist if the gum hypertrophy persists.  We will continue following her closely here while undergoing the pretransplant evaluation at [facility].

## 2019-10-14 NOTE — Progress Notes (Signed)
Lab called to report ANC of 0.0 on Meagan Walsh.  Provider Cira Rue, NP) made aware. Gardiner Rhyme, RN

## 2019-10-15 ENCOUNTER — Encounter: Admit: 2019-10-15 | Discharge: 2019-10-16 | Payer: PRIVATE HEALTH INSURANCE

## 2019-10-15 ENCOUNTER — Telehealth: Payer: Self-pay | Admitting: Nurse Practitioner

## 2019-10-15 DIAGNOSIS — Z79899 Other long term (current) drug therapy: Secondary | ICD-10-CM | POA: Diagnosis not present

## 2019-10-15 DIAGNOSIS — D801 Nonfamilial hypogammaglobulinemia: Secondary | ICD-10-CM | POA: Diagnosis not present

## 2019-10-15 DIAGNOSIS — R634 Abnormal weight loss: Secondary | ICD-10-CM | POA: Diagnosis not present

## 2019-10-15 DIAGNOSIS — B1910 Unspecified viral hepatitis B without hepatic coma: Secondary | ICD-10-CM | POA: Diagnosis not present

## 2019-10-15 DIAGNOSIS — D849 Immunodeficiency, unspecified: Secondary | ICD-10-CM | POA: Diagnosis not present

## 2019-10-15 DIAGNOSIS — D619 Aplastic anemia, unspecified: Secondary | ICD-10-CM | POA: Diagnosis not present

## 2019-10-15 DIAGNOSIS — Z7682 Awaiting organ transplant status: Secondary | ICD-10-CM | POA: Diagnosis not present

## 2019-10-15 DIAGNOSIS — C9110 Chronic lymphocytic leukemia of B-cell type not having achieved remission: Secondary | ICD-10-CM | POA: Diagnosis not present

## 2019-10-15 DIAGNOSIS — E039 Hypothyroidism, unspecified: Secondary | ICD-10-CM | POA: Diagnosis not present

## 2019-10-15 DIAGNOSIS — J189 Pneumonia, unspecified organism: Secondary | ICD-10-CM | POA: Diagnosis not present

## 2019-10-15 LAB — BPAM RBC
Blood Product Expiration Date: 202104152359
ISSUE DATE / TIME: 202103231318
Unit Type and Rh: 5100

## 2019-10-15 LAB — TYPE AND SCREEN
ABO/RH(D): O POS
Antibody Screen: NEGATIVE
Unit division: 0

## 2019-10-15 NOTE — Telephone Encounter (Signed)
Scheduled appt per 3/23 los.  Left a vm of the appt date and time.

## 2019-10-17 ENCOUNTER — Inpatient Hospital Stay: Payer: BC Managed Care – PPO | Admitting: Nurse Practitioner

## 2019-10-17 ENCOUNTER — Telehealth: Payer: Self-pay | Admitting: Nurse Practitioner

## 2019-10-17 ENCOUNTER — Inpatient Hospital Stay: Payer: BC Managed Care – PPO

## 2019-10-17 ENCOUNTER — Other Ambulatory Visit: Payer: Self-pay | Admitting: Nurse Practitioner

## 2019-10-17 DIAGNOSIS — D649 Anemia, unspecified: Secondary | ICD-10-CM

## 2019-10-17 DIAGNOSIS — D696 Thrombocytopenia, unspecified: Secondary | ICD-10-CM

## 2019-10-17 DIAGNOSIS — C9110 Chronic lymphocytic leukemia of B-cell type not having achieved remission: Secondary | ICD-10-CM

## 2019-10-17 NOTE — Telephone Encounter (Signed)
Meagan Walsh will resume tacrolimus at the previous dose/schedule.  She understands to hold the dose the morning of 10/21/2019 so a level can be drawn in the office.  We discussed the request received for a refill on Ativan.  She has been taking Ativan twice daily as part of an antiemetic regimen.  She has had no nausea for 2 weeks.  She will discontinue Ativan.

## 2019-10-20 ENCOUNTER — Other Ambulatory Visit: Payer: Self-pay

## 2019-10-20 ENCOUNTER — Other Ambulatory Visit: Payer: Self-pay | Admitting: Oncology

## 2019-10-20 DIAGNOSIS — C9110 Chronic lymphocytic leukemia of B-cell type not having achieved remission: Secondary | ICD-10-CM

## 2019-10-21 ENCOUNTER — Other Ambulatory Visit: Payer: Self-pay | Admitting: *Deleted

## 2019-10-21 ENCOUNTER — Other Ambulatory Visit: Payer: Self-pay

## 2019-10-21 ENCOUNTER — Inpatient Hospital Stay: Payer: BC Managed Care – PPO

## 2019-10-21 ENCOUNTER — Inpatient Hospital Stay (HOSPITAL_BASED_OUTPATIENT_CLINIC_OR_DEPARTMENT_OTHER): Payer: BC Managed Care – PPO | Admitting: Oncology

## 2019-10-21 VITALS — BP 130/63 | HR 92 | Temp 98.2°F | Resp 18 | Ht 66.0 in | Wt 121.3 lb

## 2019-10-21 DIAGNOSIS — C9110 Chronic lymphocytic leukemia of B-cell type not having achieved remission: Secondary | ICD-10-CM

## 2019-10-21 DIAGNOSIS — Z95828 Presence of other vascular implants and grafts: Secondary | ICD-10-CM

## 2019-10-21 DIAGNOSIS — M25519 Pain in unspecified shoulder: Secondary | ICD-10-CM | POA: Diagnosis not present

## 2019-10-21 DIAGNOSIS — D649 Anemia, unspecified: Secondary | ICD-10-CM

## 2019-10-21 DIAGNOSIS — D801 Nonfamilial hypogammaglobulinemia: Secondary | ICD-10-CM | POA: Diagnosis not present

## 2019-10-21 DIAGNOSIS — N939 Abnormal uterine and vaginal bleeding, unspecified: Secondary | ICD-10-CM | POA: Diagnosis not present

## 2019-10-21 DIAGNOSIS — D696 Thrombocytopenia, unspecified: Secondary | ICD-10-CM

## 2019-10-21 DIAGNOSIS — D61818 Other pancytopenia: Secondary | ICD-10-CM | POA: Diagnosis not present

## 2019-10-21 DIAGNOSIS — R197 Diarrhea, unspecified: Secondary | ICD-10-CM | POA: Diagnosis not present

## 2019-10-21 DIAGNOSIS — Z79899 Other long term (current) drug therapy: Secondary | ICD-10-CM | POA: Diagnosis not present

## 2019-10-21 DIAGNOSIS — E039 Hypothyroidism, unspecified: Secondary | ICD-10-CM | POA: Diagnosis not present

## 2019-10-21 DIAGNOSIS — F418 Other specified anxiety disorders: Secondary | ICD-10-CM | POA: Diagnosis not present

## 2019-10-21 DIAGNOSIS — K068 Other specified disorders of gingiva and edentulous alveolar ridge: Secondary | ICD-10-CM | POA: Diagnosis not present

## 2019-10-21 DIAGNOSIS — R112 Nausea with vomiting, unspecified: Secondary | ICD-10-CM | POA: Diagnosis not present

## 2019-10-21 DIAGNOSIS — Z452 Encounter for adjustment and management of vascular access device: Secondary | ICD-10-CM | POA: Diagnosis not present

## 2019-10-21 LAB — CBC WITH DIFFERENTIAL (CANCER CENTER ONLY)
Abs Immature Granulocytes: 0 10*3/uL (ref 0.00–0.07)
Basophils Absolute: 0 10*3/uL (ref 0.0–0.1)
Basophils Relative: 1 %
Eosinophils Absolute: 0 10*3/uL (ref 0.0–0.5)
Eosinophils Relative: 1 %
HCT: 22.6 % — ABNORMAL LOW (ref 36.0–46.0)
Hemoglobin: 7.5 g/dL — ABNORMAL LOW (ref 12.0–15.0)
Immature Granulocytes: 0 %
Lymphocytes Relative: 81 %
Lymphs Abs: 1.5 10*3/uL (ref 0.7–4.0)
MCH: 30 pg (ref 26.0–34.0)
MCHC: 33.2 g/dL (ref 30.0–36.0)
MCV: 90.4 fL (ref 80.0–100.0)
Monocytes Absolute: 0.2 10*3/uL (ref 0.1–1.0)
Monocytes Relative: 10 %
Neutro Abs: 0.1 10*3/uL — CL (ref 1.7–7.7)
Neutrophils Relative %: 7 %
Platelet Count: 15 10*3/uL — ABNORMAL LOW (ref 150–400)
RBC: 2.5 MIL/uL — ABNORMAL LOW (ref 3.87–5.11)
RDW: 14.6 % (ref 11.5–15.5)
WBC Count: 1.8 10*3/uL — ABNORMAL LOW (ref 4.0–10.5)
nRBC: 0 % (ref 0.0–0.2)

## 2019-10-21 LAB — CMP (CANCER CENTER ONLY)
ALT: 37 U/L (ref 0–44)
AST: 22 U/L (ref 15–41)
Albumin: 3.9 g/dL (ref 3.5–5.0)
Alkaline Phosphatase: 107 U/L (ref 38–126)
Anion gap: 8 (ref 5–15)
BUN: 33 mg/dL — ABNORMAL HIGH (ref 6–20)
CO2: 22 mmol/L (ref 22–32)
Calcium: 9.4 mg/dL (ref 8.9–10.3)
Chloride: 112 mmol/L — ABNORMAL HIGH (ref 98–111)
Creatinine: 0.92 mg/dL (ref 0.44–1.00)
GFR, Est AFR Am: >60 mL/min (ref >60)
GFR, Estimated: >60 mL/min (ref >60)
Glucose, Bld: 129 mg/dL — ABNORMAL HIGH (ref 70–99)
Potassium: 4.5 mmol/L (ref 3.5–5.1)
Sodium: 142 mmol/L (ref 135–145)
Total Bilirubin: 0.4 mg/dL (ref 0.3–1.2)
Total Protein: 6.2 g/dL — ABNORMAL LOW (ref 6.5–8.1)

## 2019-10-21 LAB — PREPARE RBC (CROSSMATCH)

## 2019-10-21 LAB — SAMPLE TO BLOOD BANK

## 2019-10-21 MED ORDER — HEPARIN SOD (PORK) LOCK FLUSH 100 UNIT/ML IV SOLN
250.0000 [IU] | INTRAVENOUS | Status: AC | PRN
  Administered 2019-10-21: 250 [IU]
  Filled 2019-10-21: qty 5

## 2019-10-21 MED ORDER — SODIUM CHLORIDE 0.9% IV SOLUTION
250.0000 mL | Freq: Once | INTRAVENOUS | Status: DC
  Filled 2019-10-21: qty 250

## 2019-10-21 MED ORDER — ONDANSETRON 8 MG PO TBDP: 8.0000 mg | ORAL_TABLET | Freq: Three times a day (TID) | ORAL | 1 refills | Status: DC | PRN

## 2019-10-21 MED ORDER — SODIUM CHLORIDE 0.9% FLUSH
10.0000 mL | INTRAVENOUS | Status: AC | PRN
  Administered 2019-10-21: 10 mL
  Filled 2019-10-21: qty 10

## 2019-10-21 MED ORDER — SODIUM CHLORIDE 0.9% FLUSH
10.0000 mL | INTRAVENOUS | Status: DC | PRN
  Administered 2019-10-21: 10 mL
  Filled 2019-10-21: qty 10

## 2019-10-21 NOTE — Patient Instructions (Signed)

## 2019-10-21 NOTE — Patient Instructions (Signed)
PICC Home Care Guide  A peripherally inserted central catheter (PICC) is a form of IV access that allows medicines and IV fluids to be quickly distributed throughout the body. The PICC is a long, thin, flexible tube (catheter) that is inserted into a vein in the upper arm. The catheter ends in a large vein in the chest (superior vena cava, or SVC). After the PICC is inserted, a chest X-ray may be done to make sure that it is in the correct place. A PICC may be placed for different reasons, such as:  To give medicines and liquid nutrition.  To give IV fluids and blood products.  If there is trouble placing a peripheral intravenous (PIV) catheter. If taken care of properly, a PICC can remain in place for several months. Having a PICC can also allow a person to go home from the hospital sooner. Medicine and PICC care can be managed at home by a family member, caregiver, or home health care team. What are the risks? Generally, having a PICC is safe. However, problems may occur, including:  A blood clot (thrombus) forming in or at the tip of the PICC.  A blood clot forming in a vein (deep vein thrombosis) or traveling to the lung (pulmonary embolism).  Inflammation of the vein (phlebitis) in which the PICC is placed.  Infection. Central line associated blood stream infection (CLABSI) is a serious infection that often requires hospitalization.  PICC movement (malposition). The PICC tip may move from its original position due to excessive physical activity, forceful coughing, sneezing, or vomiting.  A break or cut in the PICC. It is important not to use scissors near the PICC.  Nerve or tendon irritation or injury during PICC insertion. How to take care of your PICC Preventing problems  You and any caregivers should wash your hands often with soap. Wash hands: ? Before touching the PICC line or the infusion device. ? Before changing a bandage (dressing).  Flush the PICC as told by your  health care provider. Let your health care provider know right away if the PICC is hard to flush or does not flush. Do not use force to flush the PICC.  Do not use a syringe that is less than 10 mL to flush the PICC.  Avoid blood pressure checks on the arm in which the PICC is placed.  Never pull or tug on the PICC.  Do not take the PICC out yourself. Only a trained clinical professional should remove the PICC.  Use clean and sterile supplies only. Keep the supplies in a dry place. Do not reuse needles, syringes, or any other supplies. Doing that can lead to infection.  Keep pets and children away from your PICC line.  Check the PICC insertion site every day for signs of infection. Check for: ? Leakage. ? Redness, swelling, or pain. ? Fluid or blood. ? Warmth. ? Pus or a bad smell. PICC dressing care  Keep your PICC bandage (dressing) clean and dry to prevent infection.  Do not take baths, swim, or use a hot tub until your health care provider approves. Ask your health care provider if you can take showers. You may only be allowed to take sponge baths for bathing. When you are allowed to shower: ? Ask your health care provider to teach you how to wrap the PICC line. ? Cover the PICC line with clear plastic wrap and tape to keep it dry while showering.  Follow instructions from your health care provider   about how to take care of your insertion site and dressing. Make sure you: ? Wash your hands with soap and water before you change your bandage (dressing). If soap and water are not available, use hand sanitizer. ? Change your dressing as told by your health care provider. ? Leave stitches (sutures), skin glue, or adhesive strips in place. These skin closures may need to stay in place for 2 weeks or longer. If adhesive strip edges start to loosen and curl up, you may trim the loose edges. Do not remove adhesive strips completely unless your health care provider tells you to do  that.  Change your PICC dressing if it becomes loose or wet. General instructions   Carry your PICC identification card or wear a medical alert bracelet at all times.  Keep the tube clamped at all times, unless it is being used.  Carry a smooth-edge clamp with you at all times to place on the tube if it breaks.  Do not use scissors or sharp objects near the tube.  You may bend your arm and move it freely. If your PICC is near or at the bend of your elbow, avoid activity with repeated motion at the elbow.  Avoid lifting heavy objects as told by your health care provider.  Keep all follow-up visits as told by your health care provider. This is important. Disposal of supplies  Throw away any syringes in a disposal container that is meant for sharp items (sharps container). You can buy a sharps container from a pharmacy, or you can make one by using an empty hard plastic bottle with a cover.  Place any used dressings or infusion bags into a plastic bag. Throw that bag in the trash. Contact a health care provider if:  You have pain in your arm, ear, face, or teeth.  You have a fever or chills.  You have redness, swelling, or pain around the insertion site.  You have fluid or blood coming from the insertion site.  Your insertion site feels warm to the touch.  You have pus or a bad smell coming from the insertion site.  Your skin feels hard and raised around the insertion site. Get help right away if:  Your PICC is accidentally pulled all the way out. If this happens, cover the insertion site with a bandage or gauze dressing. Do not throw the PICC away. Your health care provider will need to check it.  Your PICC was tugged or pulled and has partially come out. Do not  push the PICC back in.  You cannot flush the PICC, it is hard to flush, or the PICC leaks around the insertion site when it is flushed.  You hear a "flushing" sound when the PICC is flushed.  You feel your  heart racing or skipping beats.  There is a hole or tear in the PICC.  You have swelling in the arm in which the PICC was inserted.  You have a red streak going up your arm from where the PICC was inserted. Summary  A peripherally inserted central catheter (PICC) is a long, thin, flexible tube (catheter) that is inserted into a vein in the upper arm.  The PICC is inserted using a sterile technique by a specially trained nurse or physician. Only a trained clinical professional should remove it.  Keep your PICC identification card with you at all times.  Avoid blood pressure checks on the arm in which the PICC is placed.  If cared for   properly, a PICC can remain in place for several months. Having a PICC can also allow a person to go home from the hospital sooner. This information is not intended to replace advice given to you by your health care provider. Make sure you discuss any questions you have with your health care provider. Document Revised: 06/22/2017 Document Reviewed: 08/12/2016 Elsevier Patient Education  2020 [employer]  

## 2019-10-21 NOTE — Progress Notes (Signed)
[facility] OFFICE PROGRESS NOTE   Diagnosis: CLL, aplastic anemia  INTERVAL HISTORY:   Ms. Leiner returns as scheduled.  She resumed tacrolimus on 10/17/2019.  She was evaluated at the [facility] transplant clinic last week.  No nausea.  No bleeding.  She feels well.  She is exercising.  She has exertional dyspnea and feels that she needs a red cell transfusion today.  Objective:  Vital signs in last 24 hours:  Blood pressure 130/63, pulse 92, temperature 98.2 F (36.8 C), temperature source Temporal, resp. rate 18, height '5\' 6"'  (1.676 m), weight 121 lb 4.8 oz (55 kg), SpO2 100 %.    HEENT: Few petechiae at the left buccal mucosa, gum hypertrophy most prominent at the right upper incisor, no thrush Cardio: Regular rate and rhythm Vascular: No leg edema  Skin: Resolving ecchymoses at the arms, old ecchymosis at the lower legs  Portacath/PICC-without erythema  Lab Results:  Lab Results  Component Value Date   WBC 1.8 (L) 10/21/2019   HGB 7.5 (L) 10/21/2019   HCT 22.6 (L) 10/21/2019   MCV 90.4 10/21/2019   PLT 15 (L) 10/21/2019   NEUTROABS 0.1 (LL) 10/21/2019    CMP  Lab Results  Component Value Date   NA 142 10/21/2019   K 4.5 10/21/2019   CL 112 (H) 10/21/2019   CO2 22 10/21/2019   GLUCOSE 129 (H) 10/21/2019   BUN 33 (H) 10/21/2019   CREATININE 0.92 10/21/2019   CALCIUM 9.4 10/21/2019   PROT 6.2 (L) 10/21/2019   ALBUMIN 3.9 10/21/2019   AST 22 10/21/2019   ALT 37 10/21/2019   ALKPHOS 107 10/21/2019   BILITOT 0.4 10/21/2019   GFRNONAA >60 10/21/2019   GFRAA >60 10/21/2019     Medications: I have reviewed the patient's current medications.   Assessment/Plan: CLL-diagnosed in August 2010, flow cytometry consistent with CLL Enlarged leftinguinal lymph node January 2019,smallneck/axillary nodes and palpable splenomegaly 09/11/2017  CTson 09/17/2017-3 cm necrotic appearing lymph node in the left inguinal region, borderline enlarged  pelvic/retroperitoneal, chest, and axillary nodes. Mild splenomegaly.  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, no evidence of 11q22deletion  Bone marrow biopsy 02/26/2018-hypercellular marrow with extensive involvement by CLL, lymphocytes represent85% of all cells  Ibrutinib initiated 04/03/2018  Ibrutinib placed on hold 04/11/2018 due to onset of arthralgias  Ibrutinib resumed 04/16/2018, discontinued 04/25/2018 secondary to severe arthralgias/arthritis  Ibrutinib resumed at a dose of 140 mg daily 05/03/2018  Ibrutinib dose adjusted to 140 mg alternating with 280m 06/25/2018  Ibrutinib discontinued 07/03/2018 secondary to severe arthralgias  Acalabrutinib 08/16/2018, discontinued 11/15/2018 secondary to persistent severe transfusion dependent anemia and neutropenia/thrombocytopenia, last dose 11/14/2018  Bone marrow biopsy 11/21/2018-decreased cellularity, involvement by CLL, decreased erythroid and granulocytic precursors, decreased megakaryocytes  Cycle 1 rituximab 12/06/2018  Bone marrow biopsy 12/24/2018 at [facility]-hypocellular bone marrow (10%) involved by CLL, representing 50% of marrow cellularity; markedly decreased trilineage hematopoiesis including essentially absent erythropoiesis  Bone marrow biopsy 03/06/2027 [facility]-hypocellular bone marrow (20%) with marked reduction of maturing hematopoietic elements; numerous lymphoid aggregates  consistent with involvement by CLL, representing approximately 50%  of marrow cellularity by flow cytometry  [facility] at [facility] beginning 03/19/2019 followed by prednisone taper  Eltrombopag beginning 07/05/2019  Cyclosporine discontinued 07/08/2019  Tacrolimus 07/10/2019, discontinued 09/24/2019  Bone marrow biopsy 10/07/2019 at [facility] bone marrow sampling (<5% cellular) essentially devoid of hematopoietic elements; Persistent chronic lymphocytic leukemia, representing 23% of marrow cellularity by flow cytometric analysis; Routine cytogenetic analysis is pending.  CT CAP 10/10/2019 showed no acute process or evidence of active lymphoma/leukemia within  the chest, abdomen, or pelvis. Resection or resolution of previously described necrotic left  inguinal node  Tacrolimus resumed 10/17/2019  2.Hypothyroidism 3.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 Augmentin prescribed. 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 Pancytopenia secondary to CLL and a hypocellular bone marrow  G-CSF and Nplate started 4/88/8916, G-CSF changed to daily beginning 12/17/2018; G-CSF discontinued 12/31/2018, last Nplate 01/15/2019  Began prednisone 60 mg daily 01/11/19, tolerating moderately well except jitteriness, irritability, and difficulty sleeping. Tapered to 40 mg daily x1 week starting 01/24/19, reduce by 10 mg each week until discontinued. Prednisone discontinued 02/20/2019.  Promacta started 01/20/2019, dose increased to 100 mg daily 02/04/2019; dose increased to 150 mg daily 02/21/2019  IVIG daily for 2  days beginning 02/25/2019  9. 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 No lymphadenopathy or splenomegaly. Exjade beginning 06/28/2019. 13. Elevated creatinine-cyclosporine toxicity? Resolved. 14.Vaginal bleeding beginning 07/13/2019-Mirena device removed, uterine fibroids and possible endometrial fluid or thickening in the fundus per CT CAP on 10/10/19 15.Elevated lithium level 08/08/2019, lithium toxicity?-Lithium held 08/08/2019-08/12/2019, then resumed at a lower dose 16. Deconditioning, secondary to n/v/d 17. Orthostatic hypotension 09/15/19,received500 cc NS in clinic   Disposition: Meagan Walsh appears well.  She has persistent severe pancytopenia.  The platelet count has drifted down over the past few weeks.  This may be related to stopping the tacrolimus.  The tacrolimus was resumed on 10/17/2019.  She continues eltrombopag.  She will be transfused with 1 unit of red cells today.  She will return for a CBC on 10/24/2019.  We will provide platelet transfusion support if the count is lower.  She will call for bleeding or a fever.  Ms. Deremer is estimated to undergo an allogeneic transplant within the next month.  Betsy Coder, MD  10/21/2019  9:23 AM

## 2019-10-21 NOTE — Progress Notes (Signed)
LATE ENTRY-Critical value called to me on Mrs. [place].  ANC 0.1.  Merceda Elks, RN aware and patient is in infusion now.  Gardiner Rhyme, RN

## 2019-10-21 NOTE — Progress Notes (Signed)
@   0925 MD aware of platelet count 15,000-will repeat labs on Friday (no platelet transfusion today). Also aware of ANC 0.1-covered w/antibiotics

## 2019-10-22 LAB — BPAM RBC
Blood Product Expiration Date: 202104272359
ISSUE DATE / TIME: 202103301105
Unit Type and Rh: 5100

## 2019-10-22 LAB — TYPE AND SCREEN
ABO/RH(D): O POS
Antibody Screen: NEGATIVE
Unit division: 0

## 2019-10-23 LAB — TACROLIMUS LEVEL: Tacrolimus (FK506) - LabCorp: 4.7 ng/mL (ref 2.0–20.0)

## 2019-10-24 ENCOUNTER — Other Ambulatory Visit: Payer: Self-pay

## 2019-10-24 ENCOUNTER — Other Ambulatory Visit: Payer: Self-pay | Admitting: *Deleted

## 2019-10-24 ENCOUNTER — Inpatient Hospital Stay: Payer: BC Managed Care – PPO

## 2019-10-24 ENCOUNTER — Inpatient Hospital Stay: Payer: BC Managed Care – PPO | Attending: Oncology

## 2019-10-24 DIAGNOSIS — C9110 Chronic lymphocytic leukemia of B-cell type not having achieved remission: Secondary | ICD-10-CM | POA: Diagnosis not present

## 2019-10-24 DIAGNOSIS — E875 Hyperkalemia: Secondary | ICD-10-CM | POA: Diagnosis not present

## 2019-10-24 DIAGNOSIS — D61818 Other pancytopenia: Secondary | ICD-10-CM | POA: Diagnosis not present

## 2019-10-24 DIAGNOSIS — D696 Thrombocytopenia, unspecified: Secondary | ICD-10-CM

## 2019-10-24 DIAGNOSIS — Z452 Encounter for adjustment and management of vascular access device: Secondary | ICD-10-CM

## 2019-10-24 DIAGNOSIS — E039 Hypothyroidism, unspecified: Secondary | ICD-10-CM | POA: Diagnosis not present

## 2019-10-24 DIAGNOSIS — Z79899 Other long term (current) drug therapy: Secondary | ICD-10-CM | POA: Diagnosis not present

## 2019-10-24 DIAGNOSIS — I951 Orthostatic hypotension: Secondary | ICD-10-CM | POA: Insufficient documentation

## 2019-10-24 DIAGNOSIS — Z95828 Presence of other vascular implants and grafts: Secondary | ICD-10-CM

## 2019-10-24 LAB — CBC WITH DIFFERENTIAL (CANCER CENTER ONLY)
Abs Immature Granulocytes: 0 10*3/uL (ref 0.00–0.07)
Basophils Absolute: 0 10*3/uL (ref 0.0–0.1)
Basophils Relative: 1 %
Eosinophils Absolute: 0 10*3/uL (ref 0.0–0.5)
Eosinophils Relative: 1 %
HCT: 27.5 % — ABNORMAL LOW (ref 36.0–46.0)
Hemoglobin: 9.2 g/dL — ABNORMAL LOW (ref 12.0–15.0)
Immature Granulocytes: 0 %
Lymphocytes Relative: 78 %
Lymphs Abs: 2.5 10*3/uL (ref 0.7–4.0)
MCH: 30.2 pg (ref 26.0–34.0)
MCHC: 33.5 g/dL (ref 30.0–36.0)
MCV: 90.2 fL (ref 80.0–100.0)
Monocytes Absolute: 0.2 10*3/uL (ref 0.1–1.0)
Monocytes Relative: 6 %
Neutro Abs: 0.5 10*3/uL — ABNORMAL LOW (ref 1.7–7.7)
Neutrophils Relative %: 14 %
Platelet Count: 11 10*3/uL — ABNORMAL LOW (ref 150–400)
RBC: 3.05 MIL/uL — ABNORMAL LOW (ref 3.87–5.11)
RDW: 14 % (ref 11.5–15.5)
WBC Count: 3.1 10*3/uL — ABNORMAL LOW (ref 4.0–10.5)
nRBC: 0 % (ref 0.0–0.2)

## 2019-10-24 LAB — SAMPLE TO BLOOD BANK

## 2019-10-24 MED ORDER — SODIUM CHLORIDE 0.9% FLUSH
10.0000 mL | INTRAVENOUS | Status: AC | PRN
  Administered 2019-10-24: 10 mL
  Filled 2019-10-24: qty 10

## 2019-10-24 MED ORDER — HEPARIN SOD (PORK) LOCK FLUSH 100 UNIT/ML IV SOLN
250.0000 [IU] | INTRAVENOUS | Status: AC | PRN
  Administered 2019-10-24: 16:00:00 250 [IU]
  Filled 2019-10-24: qty 5

## 2019-10-24 MED ORDER — SODIUM CHLORIDE 0.9% IV SOLUTION
250.0000 mL | Freq: Once | INTRAVENOUS | Status: AC
  Administered 2019-10-24: 250 mL via INTRAVENOUS
  Filled 2019-10-24: qty 250

## 2019-10-24 MED ORDER — HEPARIN SOD (PORK) LOCK FLUSH 100 UNIT/ML IV SOLN
250.0000 [IU] | Freq: Once | INTRAVENOUS | Status: AC
  Administered 2019-10-24: 250 [IU]
  Filled 2019-10-24: qty 5

## 2019-10-24 MED ORDER — SODIUM CHLORIDE 0.9% FLUSH
10.0000 mL | INTRAVENOUS | Status: DC | PRN
  Administered 2019-10-24: 10 mL
  Filled 2019-10-24: qty 10

## 2019-10-24 MED ORDER — TACROLIMUS 0.1 % IN VEHICLE BASE NO.238 TOPICAL CREAM
0 days
Start: 2019-10-24 — End: ?

## 2019-10-24 MED ORDER — TACROLIMUS 1 MG CAPSULE, IMMEDIATE-RELEASE
ORAL_CAPSULE | Freq: Two times a day (BID) | ORAL | 0 refills | 30 days | Status: CP
Start: 2019-10-24 — End: 2019-11-23

## 2019-10-24 NOTE — Progress Notes (Signed)
CRITICAL VALUE STICKER  CRITICAL VALUE: Pltc 11,000 and ANC 0.5  RECEIVER (on-site recipient of call): Alante Weimann,RN   DATE & TIME NOTIFIED: 10/24/19 @1038    MESSENGER (representative from lab): Deidre Ala  MD NOTIFIED: Dr. Benay Spice  TIME OF NOTIFICATION: 10:45  RESPONSE: Transfuse platelets today or tomorrow. No orders for ANC (better) Valentyna agrees to return today at 2 pm for platelet infusion. Orders placed and blood bank notified.

## 2019-10-24 NOTE — Patient Instructions (Signed)

## 2019-10-25 LAB — BPAM PLATELET PHERESIS
Blood Product Expiration Date: 202104022359
ISSUE DATE / TIME: 202104021414
Unit Type and Rh: 6200

## 2019-10-25 LAB — PREPARE PLATELET PHERESIS: Unit division: 0

## 2019-10-27 ENCOUNTER — Other Ambulatory Visit: Payer: Self-pay | Admitting: Oncology

## 2019-10-28 ENCOUNTER — Other Ambulatory Visit: Payer: Self-pay | Admitting: Nurse Practitioner

## 2019-10-28 ENCOUNTER — Encounter: Payer: Self-pay | Admitting: Nurse Practitioner

## 2019-10-28 ENCOUNTER — Other Ambulatory Visit: Payer: Self-pay

## 2019-10-28 ENCOUNTER — Inpatient Hospital Stay: Payer: BC Managed Care – PPO

## 2019-10-28 ENCOUNTER — Encounter: Payer: Self-pay | Admitting: Oncology

## 2019-10-28 ENCOUNTER — Inpatient Hospital Stay (HOSPITAL_BASED_OUTPATIENT_CLINIC_OR_DEPARTMENT_OTHER): Payer: BC Managed Care – PPO | Admitting: Nurse Practitioner

## 2019-10-28 VITALS — BP 108/64 | HR 79 | Temp 98.7°F | Resp 18 | Wt 122.8 lb

## 2019-10-28 DIAGNOSIS — Z452 Encounter for adjustment and management of vascular access device: Secondary | ICD-10-CM

## 2019-10-28 DIAGNOSIS — C9110 Chronic lymphocytic leukemia of B-cell type not having achieved remission: Secondary | ICD-10-CM

## 2019-10-28 DIAGNOSIS — E875 Hyperkalemia: Secondary | ICD-10-CM | POA: Diagnosis not present

## 2019-10-28 DIAGNOSIS — Z79899 Other long term (current) drug therapy: Secondary | ICD-10-CM | POA: Diagnosis not present

## 2019-10-28 DIAGNOSIS — D61818 Other pancytopenia: Secondary | ICD-10-CM | POA: Diagnosis not present

## 2019-10-28 DIAGNOSIS — D649 Anemia, unspecified: Secondary | ICD-10-CM

## 2019-10-28 DIAGNOSIS — I951 Orthostatic hypotension: Secondary | ICD-10-CM | POA: Diagnosis not present

## 2019-10-28 DIAGNOSIS — D696 Thrombocytopenia, unspecified: Secondary | ICD-10-CM

## 2019-10-28 DIAGNOSIS — E039 Hypothyroidism, unspecified: Secondary | ICD-10-CM | POA: Diagnosis not present

## 2019-10-28 DIAGNOSIS — Z95828 Presence of other vascular implants and grafts: Secondary | ICD-10-CM

## 2019-10-28 LAB — CBC WITH DIFFERENTIAL (CANCER CENTER ONLY)
Abs Immature Granulocytes: 0.01 10*3/uL (ref 0.00–0.07)
Basophils Absolute: 0 10*3/uL (ref 0.0–0.1)
Basophils Relative: 0 %
Eosinophils Absolute: 0 10*3/uL (ref 0.0–0.5)
Eosinophils Relative: 1 %
HCT: 23.8 % — ABNORMAL LOW (ref 36.0–46.0)
Hemoglobin: 8.1 g/dL — ABNORMAL LOW (ref 12.0–15.0)
Immature Granulocytes: 0 %
Lymphocytes Relative: 78 %
Lymphs Abs: 4.1 10*3/uL — ABNORMAL HIGH (ref 0.7–4.0)
MCH: 30 pg (ref 26.0–34.0)
MCHC: 34 g/dL (ref 30.0–36.0)
MCV: 88.1 fL (ref 80.0–100.0)
Monocytes Absolute: 0.2 10*3/uL (ref 0.1–1.0)
Monocytes Relative: 4 %
Neutro Abs: 0.9 10*3/uL — ABNORMAL LOW (ref 1.7–7.7)
Neutrophils Relative %: 17 %
Platelet Count: 19 10*3/uL — ABNORMAL LOW (ref 150–400)
RBC: 2.7 MIL/uL — ABNORMAL LOW (ref 3.87–5.11)
RDW: 13.6 % (ref 11.5–15.5)
WBC Count: 5.3 10*3/uL (ref 4.0–10.5)
nRBC: 0 % (ref 0.0–0.2)

## 2019-10-28 LAB — CMP (CANCER CENTER ONLY)
ALT: 40 U/L (ref 0–44)
AST: 25 U/L (ref 15–41)
Albumin: 4.3 g/dL (ref 3.5–5.0)
Alkaline Phosphatase: 122 U/L (ref 38–126)
Anion gap: 9 (ref 5–15)
BUN: 41 mg/dL — ABNORMAL HIGH (ref 6–20)
CO2: 23 mmol/L (ref 22–32)
Calcium: 9.4 mg/dL (ref 8.9–10.3)
Chloride: 108 mmol/L (ref 98–111)
Creatinine: 1.18 mg/dL — ABNORMAL HIGH (ref 0.44–1.00)
GFR, Est AFR Am: 58 mL/min — ABNORMAL LOW (ref >60)
GFR, Estimated: 50 mL/min — ABNORMAL LOW (ref >60)
Glucose, Bld: 107 mg/dL — ABNORMAL HIGH (ref 70–99)
Potassium: 5.6 mmol/L — ABNORMAL HIGH (ref 3.5–5.1)
Sodium: 140 mmol/L (ref 135–145)
Total Bilirubin: 0.5 mg/dL (ref 0.3–1.2)
Total Protein: 6.4 g/dL — ABNORMAL LOW (ref 6.5–8.1)

## 2019-10-28 LAB — LITHIUM LEVEL: Lithium Lvl: 0.78 mmol/L (ref 0.60–1.20)

## 2019-10-28 LAB — BASIC METABOLIC PANEL - CANCER CENTER ONLY
Anion gap: 9 (ref 5–15)
BUN: 40 mg/dL — ABNORMAL HIGH (ref 6–20)
CO2: 23 mmol/L (ref 22–32)
Calcium: 9.3 mg/dL (ref 8.9–10.3)
Chloride: 107 mmol/L (ref 98–111)
Creatinine: 1.25 mg/dL — ABNORMAL HIGH (ref 0.44–1.00)
GFR, Est AFR Am: 54 mL/min — ABNORMAL LOW (ref >60)
GFR, Estimated: 47 mL/min — ABNORMAL LOW (ref >60)
Glucose, Bld: 160 mg/dL — ABNORMAL HIGH (ref 70–99)
Potassium: 5.5 mmol/L — ABNORMAL HIGH (ref 3.5–5.1)
Sodium: 139 mmol/L (ref 135–145)

## 2019-10-28 LAB — PREPARE RBC (CROSSMATCH)

## 2019-10-28 LAB — SAMPLE TO BLOOD BANK

## 2019-10-28 LAB — MAGNESIUM: Magnesium: 2.3 mg/dL (ref 1.7–2.4)

## 2019-10-28 MED ORDER — TACROLIMUS 0.5 MG PO CAPS: 0.5000 mg | ORAL_CAPSULE | Freq: Two times a day (BID) | ORAL | 0 refills | Status: DC

## 2019-10-28 MED ORDER — SODIUM CHLORIDE 0.9% FLUSH
10.0000 mL | INTRAVENOUS | Status: DC | PRN
  Administered 2019-10-28: 10 mL
  Filled 2019-10-28: qty 10

## 2019-10-28 MED ORDER — HEPARIN SOD (PORK) LOCK FLUSH 100 UNIT/ML IV SOLN
250.0000 [IU] | INTRAVENOUS | Status: AC | PRN
  Administered 2019-10-28: 250 [IU]
  Filled 2019-10-28: qty 5

## 2019-10-28 MED ORDER — SODIUM POLYSTYRENE SULFONATE 15 GM/60ML PO SUSP: 30.0000 g | Freq: Once | ORAL | 0 refills | Status: AC

## 2019-10-28 MED ORDER — HEPARIN SOD (PORK) LOCK FLUSH 100 UNIT/ML IV SOLN
500.0000 [IU] | Freq: Once | INTRAVENOUS | Status: AC | PRN
  Administered 2019-10-28: 250 [IU]
  Filled 2019-10-28: qty 5

## 2019-10-28 MED ORDER — SODIUM CHLORIDE 0.9% IV SOLUTION
250.0000 mL | Freq: Once | INTRAVENOUS | Status: DC
  Filled 2019-10-28: qty 250

## 2019-10-28 MED ORDER — SODIUM CHLORIDE 0.9% FLUSH
10.0000 mL | INTRAVENOUS | Status: AC | PRN
  Administered 2019-10-28: 10 mL
  Filled 2019-10-28: qty 10

## 2019-10-28 NOTE — Progress Notes (Signed)
Tacrolimus level canceled. Per Ned Card, NP

## 2019-10-28 NOTE — Patient Instructions (Signed)

## 2019-10-28 NOTE — Progress Notes (Addendum)
[facility] OFFICE PROGRESS NOTE   Diagnosis: CLL, aplastic anemia  INTERVAL HISTORY:   Ms. Zunker returns as scheduled.  She was transfused a unit of blood on 10/21/2019.  She was transfused platelets on 10/24/2019.  She is denies bleeding except vaginal.  She is undergoing evaluation by gynecology and is scheduled for ultrasound later this week.  No fever.  No nausea or vomiting.  She has a good appetite.  She feels "winded".  Objective:  Vital signs in last 24 hours:  Blood pressure 108/64, pulse 79, temperature 98.7 F (37.1 C), temperature source Tympanic, resp. rate 18, weight 122 lb 12.8 oz (55.7 kg), SpO2 100 %.    HEENT: No thrush or ulcers.  Gingival hypertrophy. Resp: Lungs clear bilaterally. Cardio: Regular rate and rhythm. GI: Abdomen soft and nontender.  No hepatosplenomegaly. Vascular: No leg edema. Left upper extremity PICC without erythema.  Lab Results:  Lab Results  Component Value Date   WBC 5.3 10/28/2019   HGB 8.1 (L) 10/28/2019   HCT 23.8 (L) 10/28/2019   MCV 88.1 10/28/2019   PLT 19 (L) 10/28/2019   NEUTROABS 0.9 (L) 10/28/2019    Imaging:  No results found.  Medications: I have reviewed the patient's current medications.  Assessment/Plan: 1. 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 Mild splenomegaly.  Ultrasound-guided biopsy of the left inguinal lymph node 09/18/2017, slightly "purulent "fluid aspirated, core biopsy is consistent with an atypical lymphoid proliferation-extensive necrosis with surrounding epithelioid histiocytes, limited intact lymphoid tissue involved with CLL  Incisional biopsy of a necrotic/purulent left inguinal lymph node on  10/01/2017-extensive necrosis with granulomatous inflammation, small amount of viable lymphoid tissue involved with CLL, AFB and fungal stains negative  Peripheral blood FISH analysis 02/05/2018-deletion 13q14, no evidence of p53 (17p13) deletion, no evidence of 11q22deletion  Bone marrow biopsy 02/26/2018-hypercellular marrow with extensive involvement by CLL, lymphocytes represent85% of all cells  Ibrutinib initiated 04/03/2018  Ibrutinib placed on hold 04/11/2018 due to onset of arthralgias  Ibrutinib resumed 04/16/2018, discontinued 04/25/2018 secondary to severe arthralgias/arthritis  Ibrutinib resumed at a dose of 140 mg daily 05/03/2018  Ibrutinib dose adjusted to 140 mg alternating with 224m 06/25/2018  Ibrutinib discontinued 07/03/2018 secondary to severe arthralgias  Acalabrutinib 08/16/2018, discontinued 11/15/2018 secondary to persistent severe transfusion dependent anemia and neutropenia/thrombocytopenia, last dose 11/14/2018  Bone marrow biopsy 11/21/2018-decreased cellularity, involvement by CLL, decreased erythroid and granulocytic precursors, decreased megakaryocytes  Cycle 1 rituximab 12/06/2018  Bone marrow biopsy 12/24/2018 at [facility]-hypocellular bone marrow (10%) involved by CLL, representing 50% of marrow cellularity; markedly decreased trilineage hematopoiesis including essentially absent erythropoiesis  Bone marrow biopsy 03/06/2027 [facility]-hypocellular bone marrow (20%) with marked reduction of maturing hematopoietic elements; numerous lymphoid aggregates consistent with involvement by CLL, representing approximately 50% of marrow cellularity by flow cytometry  [facility] by prednisone taper  Eltrombopag beginning 07/05/2019  Cyclosporine discontinued 07/08/2019  Tacrolimus 07/10/2019, discontinued 09/24/2019  [facility] (<5% cellular) essentially devoid of hematopoietic elements;  Persistent chronic lymphocytic leukemia, representing 23% of marrow cellularity by flow cytometric analysis; Routine cytogenetic analysis is pending.  CT CAP 10/10/2019 showed no acute process or evidence of active lymphoma/leukemia within             the chest,  abdomen, or pelvis. Resection or resolution of previously described necrotic left             inguinal node  Tacrolimus resumed 10/17/2019  2.Hypothyroidism 3.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 Augmentin prescribed. 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 Pancytopenia secondary to CLL and a hypocellular bone marrow  G-CSF and Nplate started 1/66/0600, G-CSF changed to daily beginning 12/17/2018; G-CSF discontinued 12/31/2018, last Nplate 01/15/2019  Began prednisone 60 mg daily 01/11/19, tolerating moderately well except jitteriness, irritability, and difficulty sleeping. Tapered to 40 mg daily x1 week starting 01/24/19, reduce by 10 mg each week until discontinued. Prednisone discontinued 02/20/2019.  Promacta started 01/20/2019, dose increased to 100 mg daily 02/04/2019; dose increased to 150 mg daily 02/21/2019  IVIG daily for 2 days beginning 02/25/2019  9. 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 No lymphadenopathy or splenomegaly. Exjade beginning 06/28/2019. 13. Elevated  creatinine-cyclosporine toxicity? Resolved. 14.Vaginal bleeding beginning 07/13/2019-Mirena device removed, uterine fibroids and possible endometrial fluid or thickening in the fundus per CT CAP on 10/10/19 15.Elevated lithium level 08/08/2019, lithium toxicity?-Lithium held 08/08/2019-08/12/2019, then resumed at a lower dose 16. Deconditioning, secondary to n/v/d 17. Orthostatic hypotension 09/15/19,received500 cc NS in clinic   Disposition: Ms. Nass appears stable.  We reviewed the CBC from today.  The platelet count is higher.  Neutrophil count has further improved.  Hemoglobin is lower. She is symptomatic.  She will receive 1 unit of blood today.  She will return for a follow-up CBC on 10/30/2019.  She will return for lab and follow-up in 1 week.  Patient seen with Dr. Benay Spice.    Ned Card ANP/GNP-BC   10/28/2019  12:55 PM This was a shared visit with Ned Card.  Ms. Higby appears unchanged.  The neutrophil count has improved over the past week.  She has persistent severe anemia and thrombocytopenia.  She will receive 1 unit of red cells today.  The potassium and creatinine are higher today.  I discussed the case with the [facility].  This may be related to tacrolimus.  We will check a tacrolimus trough level on 10/30/2019.  I also discussed the case with the nephrology service.  They agreed the tacrolimus may be contributing to the renal failure/hyperkalemia.  We will decrease the tacrolimus dose, give Kayexalate today, and repeat labs on 10/30/2019.  We will also hold Bactrim.  The plan remains to proceed with allogeneic transplant.  [name], MD

## 2019-10-29 ENCOUNTER — Encounter: Payer: Self-pay | Admitting: Psychiatry

## 2019-10-29 ENCOUNTER — Other Ambulatory Visit: Payer: Self-pay | Admitting: *Deleted

## 2019-10-29 ENCOUNTER — Telehealth: Payer: Self-pay | Admitting: Oncology

## 2019-10-29 ENCOUNTER — Ambulatory Visit (INDEPENDENT_AMBULATORY_CARE_PROVIDER_SITE_OTHER): Payer: BC Managed Care – PPO | Admitting: Psychiatry

## 2019-10-29 DIAGNOSIS — C9110 Chronic lymphocytic leukemia of B-cell type not having achieved remission: Secondary | ICD-10-CM

## 2019-10-29 DIAGNOSIS — F5105 Insomnia due to other mental disorder: Secondary | ICD-10-CM | POA: Diagnosis not present

## 2019-10-29 DIAGNOSIS — R634 Abnormal weight loss: Secondary | ICD-10-CM | POA: Diagnosis not present

## 2019-10-29 DIAGNOSIS — F3342 Major depressive disorder, recurrent, in full remission: Secondary | ICD-10-CM | POA: Diagnosis not present

## 2019-10-29 LAB — BPAM RBC
Blood Product Expiration Date: 202105032359
ISSUE DATE / TIME: 202104061422
Unit Type and Rh: 5100

## 2019-10-29 LAB — TYPE AND SCREEN
ABO/RH(D): O POS
Antibody Screen: NEGATIVE
Unit division: 0

## 2019-10-29 MED ORDER — VORTIOXETINE HBR 10 MG PO TABS: 10.0000 mg | ORAL_TABLET | Freq: Every day | ORAL | 3 refills | Status: DC

## 2019-10-29 MED ORDER — TACROLIMUS 0.5 MG PO CAPS: 0.5000 mg | ORAL_CAPSULE | Freq: Two times a day (BID) | ORAL | 0 refills | Status: DC

## 2019-10-29 MED FILL — TACROLIMUS ANHYDROUS 0.5MG: 0.5 | 30 days supply | Qty: 60 | Fill #0

## 2019-10-29 NOTE — Progress Notes (Signed)
Received fax from [facility] that Tacrolimus is not able to be filled at this pharmacy. [facility].  Sent script to [facility].

## 2019-10-29 NOTE — Progress Notes (Signed)
Traneisha Tuchman [place] TU:[id number] 06/04/1961 59 y.o.    Subjective:   Patient ID:  Meagan Walsh is a 59 y.o. (DOB 1960-11-13) female.  Chief Complaint:  Chief Complaint  Patient presents with  . Follow-up    SHANEE DAWDY presents to the office today for follow-up of bipolar depression.  seen in January 2020.  Mood was stable and no meds were changed.   seen May 2020.  Lithium level was 0.68.  Mood was stable and no meds were changed.  Discussed her care with her oncologist Dr. Benay Spice in June 2020.  She was dealing with pancytopenia and it was requested that she wean off lamotrigine to see if it was contributing.  That was done.  August 11, 2019 another conversation with Dr. Benay Spice.  She had elevated lithium of 2.1 on August 08, 2019.  She had lost some weight and was having some nausea and vomiting.  Lithium was held for a few days then restarted at 300 mg daily.  Lithium levels have been checked frequently since that time.  Last seen September 24, 2019.  She was not depressed but struggling with nausea and poor appetite and the following changes were made to see if it would help. In view of chronic nausea and weight loss despite med changes consider Trintellix.  Trintellix does cause nausea about 10% of people. It was tolerated in the past. Reduce Trintellix to 10 mg daily. Add mirtazapine 30 mg tablet at night which could help appetite and potentially replace Trintellix. If nausea is not resolved at follow up then stop Trintellix.  Call if depression worsens.  Did fine with med changes.  No problems with reduction in Trintellix.  Gained 11# since here.  At a good weight.  Wonders about stopping it for awhile. Good appetite.  No nausea now.   Still struggling with  CLL.  Needs bone marrow transplant but hasn't been able to occur yet and will be done in [place] with her living there for 3 mos.  Tired of being in limbo.   Rare Xanax used as nausea med.  Using Ambien at HS.   Sleep alright.    In spite of stress has not been depressed.  Patient reports stable mood and denies depressed or irritable moods.  Patient denies any recent difficulty with anxiety.  Patient denies difficulty with sleep initiation or maintenance with Ambien. Denies appetite disturbance.  No weight loss.  Patient reports that energy and motivation have been good.  Patient denies any difficulty with concentration.  Patient denies any suicidal ideation.  2 severe episodes depression in life that last a full year.  Multiple failed prior medication trials include but are not limited to the following: Viibryd, Abilify, Vraylar, Rexulti 0.5 mg caused restlessness, duloxetine, Viibryd, Trintellix 20 mg failed until lithium was added Mirtazapine 30 helped appetite.  Review of Systems:  Review of Systems  Gastrointestinal: Negative for diarrhea, nausea and vomiting.  Neurological: Negative for tremors and weakness.  Psychiatric/Behavioral: Negative for agitation, behavioral problems, confusion, decreased concentration, dysphoric mood, hallucinations, self-injury, sleep disturbance and suicidal ideas. The patient is not nervous/anxious and is not hyperactive.     Medications: I have reviewed the patient's current medications.  Current Outpatient Medications  Medication Sig Dispense Refill  . acetaminophen (TYLENOL) 325 MG tablet Take 650 mg by mouth every 6 (six) hours as needed.    . ALPRAZolam (XANAX) 0.25 MG tablet TAKE 1 TO 2 TABLETS BY MOUTH EVERY DAY AS NEEDED FOR ANXIETY  30 tablet 3  . cefdinir (OMNICEF) 300 MG capsule TAKE 2 CAPSULES BY MOUTH EVERY DAY 30 capsule 2  . folic acid (FOLVITE) 1 MG tablet TAKE 1 TABLET BY MOUTH EVERY DAY 90 tablet 0  . levothyroxine (SYNTHROID) 100 MCG tablet TAKE 1 TABLET BY MOUTH EVERY DAY 30 tablet 2  . lithium 300 MG tablet Take 300 mg by mouth at bedtime.    [name] LORazepam (ATIVAN) 0.5 MG tablet Take 1 tablet (0.5 mg total) by mouth 2 (two) times daily as needed  (prior to tacrolimus dose if needed). 60 tablet 0  . mirtazapine (REMERON) 30 MG tablet Take 1 tablet (30 mg total) by mouth at bedtime. 30 tablet 1  . ondansetron (ZOFRAN-ODT) 8 MG disintegrating tablet Take 1 tablet (8 mg total) by mouth every 8 (eight) hours as needed for nausea or vomiting. 60 tablet 1  . posaconazole (NOXAFIL) 100 MG TBEC delayed-release tablet TAKE 3 TABLETS BY MOUTH EVERY DAY 90 tablet 1  . PROMACTA 50 MG tablet TAKE 3 TABLETS (150 MG TOTAL) BY MOUTH DAILY. TAKE ON AN EMPTY STOMACH 1 HOUR BEFORE A MEAL OR 2 HOURS AFTER 90 tablet 2  . sulfamethoxazole-trimethoprim (BACTRIM DS) 800-160 MG tablet Take 1 tablet by mouth 2 (two) times a week. 16 tablet 5  . tacrolimus (PROGRAF) 0.5 MG capsule Take 1 capsule (0.5 mg total) by mouth 2 (two) times daily. 60 capsule 0  . valACYclovir (VALTREX) 500 MG tablet Take 500 mg by mouth daily.    [name] vortioxetine HBr (TRINTELLIX) 10 MG TABS tablet Take 1 tablet (10 mg total) by mouth daily. 30 tablet 3  . zolpidem (AMBIEN) 10 MG tablet TAKE 1/2 TO 1 TABLET BY MOUTH AT BEDTIME AS NEEDED FOR INSOMNIA. 30 tablet 2   No current facility-administered medications for this visit.   Facility-Administered Medications Ordered in Other Visits  Medication Dose Route Frequency Provider Last Rate Last Admin  . 0.9 %  sodium chloride infusion (Manually program via Guardrails IV Fluids)  250 mL Intravenous Once Betsy Coder B, MD      . 0.9 %  sodium chloride infusion (Manually program via Guardrails IV Fluids)  250 mL Intravenous Once Ladell Pier, MD      . famotidine (PEPCID) IVPB 20 mg premix  20 mg Intravenous Once Betsy Coder B, MD      . heparin lock flush 100 unit/mL  250 Units Intracatheter PRN Betsy Coder B, MD      . sodium chloride flush (NS) 0.9 % injection 10 mL  10 mL Intracatheter PRN Ladell Pier, MD   10 mL at 02/21/19 0945  . sodium chloride flush (NS) 0.9 % injection 10 mL  10 mL Intracatheter PRN Ladell Pier, MD       . SUMAtriptan (IMITREX) nasal spray 20 mg  20 mg Nasal Once Ladell Pier, MD        Medication Side Effects: None  Allergies: No Known Allergies  Past Medical History:  Diagnosis Date  . Anxiety   . Cancer ([place])    CLL  . Degenerative disk disease    Neck--chronic pain  . Depression   . Fibroids    Uterine  . 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 Hypothyroidism   . Lymphocytosis   . Mitral valve prolapse   . Mitral valve prolapse   . Thyroid disease    Hypothyroid    Family History  Problem Relation  Age of Onset  . Breast cancer Mother   . Prostate cancer Father   . Colon cancer Neg Hx   . Esophageal cancer Neg Hx   . Pancreatic cancer Neg Hx   . Stomach cancer Neg Hx     Social History   Socioeconomic History  . Marital status: Married    Spouse name: Not on file  . Number of children: Not on file  . Years of education: Not on file  . Highest education level: Not on file  Occupational History  . Not on file  Tobacco Use  . Smoking status: Never Smoker  . Smokeless tobacco: Never Used  Substance and Sexual Activity  . Alcohol use: Not Currently    Comment: 2 glasses of wine 2 times per week   . Drug use: No  . Sexual activity: Not on file  Other Topics Concern  . Not on file  Social History Narrative   Pt lives in 2 story home with her husband   Has 3 adult children   Masters degree   Works at [employer] Strain:   . Difficulty of Paying Living Expenses:   Food Insecurity:   . Worried About Charity fundraiser in the Last Year:   . Arboriculturist in the Last Year:   Transportation Needs:   . Film/video editor (Medical):   [name] Lack of Transportation (Non-Medical):   Physical Activity:   . Days of Exercise per Week:   . Minutes of Exercise per Session:   Stress:   . Feeling of Stress :   Social Connections:   . Frequency of Communication with  Friends and Family:   . Frequency of Social Gatherings with Friends and Family:   . Attends Religious Services:   . Active Member of Clubs or Organizations:   . Attends Archivist Meetings:   [name] Marital Status:   Intimate Partner Violence:   . Fear of Current or Ex-Partner:   . Emotionally Abused:   [name] Physically Abused:   . Sexually Abused:     Past Medical History, Surgical history, Social history, and Family history were reviewed and updated as appropriate.   Please see review of systems for further details on the patient's review from today.   Objective:   Physical Exam:  There were no vitals taken for this visit.  Physical Exam Constitutional:      General: She is not in acute distress.    Appearance: She is well-developed.  Musculoskeletal:        General: No deformity.  Neurological:     Mental Status: She is alert and oriented to person, place, and time.     Motor: No tremor.     Coordination: Coordination normal.     Gait: Gait normal.  Psychiatric:        Attention and Perception: Attention and perception normal.        Mood and Affect: Mood is not anxious or depressed. Affect is not labile, blunt, angry, tearful or inappropriate.        Speech: Speech normal.        Behavior: Behavior normal.        Thought Content: Thought content normal. Thought content does not include homicidal or suicidal ideation. Thought content does not include homicidal or suicidal plan.        Cognition and Memory: Cognition normal.  Judgment: Judgment normal.     Comments: Insight intact. No auditory or visual hallucinations. No delusions.      Lab Review:     Component Value Date/Time   NA 139 10/28/2019 1419   K 5.5 (H) 10/28/2019 1419   CL 107 10/28/2019 1419   CO2 23 10/28/2019 1419   GLUCOSE 160 (H) 10/28/2019 1419   BUN 40 (H) 10/28/2019 1419   CREATININE 1.25 (H) 10/28/2019 1419   CALCIUM 9.3 10/28/2019 1419   PROT 6.4 (L) 10/28/2019 1127   ALBUMIN  4.3 10/28/2019 1127   AST 25 10/28/2019 1127   ALT 40 10/28/2019 1127   ALKPHOS 122 10/28/2019 1127   BILITOT 0.5 10/28/2019 1127   GFRNONAA 47 (L) 10/28/2019 1419   GFRAA 54 (L) 10/28/2019 1419       Component Value Date/Time   WBC 5.3 10/28/2019 1127   WBC 9.9 03/31/2019 0845   RBC 2.70 (L) 10/28/2019 1127   HGB 8.1 (L) 10/28/2019 1127   HGB 14.0 06/27/2016 1038   HCT 23.8 (L) 10/28/2019 1127   HCT 42.0 06/27/2016 1038   PLT 19 (L) 10/28/2019 1127   PLT 205 06/27/2016 1038   MCV 88.1 10/28/2019 1127   MCV 92.9 06/27/2016 1038   MCH 30.0 10/28/2019 1127   MCHC 34.0 10/28/2019 1127   RDW 13.6 10/28/2019 1127   RDW 12.2 06/27/2016 1038   LYMPHSABS 4.1 (H) 10/28/2019 1127   LYMPHSABS 21.5 (H) 06/27/2016 1038   MONOABS 0.2 10/28/2019 1127   MONOABS 0.5 06/27/2016 1038   EOSABS 0.0 10/28/2019 1127   EOSABS 0.2 06/27/2016 1038   BASOSABS 0.0 10/28/2019 1127   BASOSABS 0.1 06/27/2016 1038    Lithium Lvl  Date Value Ref Range Status  10/28/2019 0.78 0.60 - 1.20 mmol/L Final    Comment:    Performed at [facility], [place] [address] [place], [place] [address]     No results found for: PHENYTOIN, PHENOBARB, VALPROATE, CBMZ   .res Assessment: Plan:    Depression, major, recurrent, in complete remission ([place]) - Plan: vortioxetine HBr (TRINTELLIX) 10 MG TABS tablet  Loss of weight  Insomnia due to mental condition   Hisaye has had history of 2 very severe prolonged depressions that failed to respond to multiple psychiatric medications but then responded to lithium potentiation.  She has been through the past 2 winters with no difficulty.  She has a very strong seasonal pattern.  Did well with reduction in Trintellix from 20 to 10 mg and addition of mirtazapine to help nausea and weight gain.  Gained 11 # and  Now has no nausea..  Lithium levels have ranged from 0.91-1.14 and 2021 and have been checked weekly. Lithium 10/28/19 = 0.78.  10/28/19 Cr 1.25  increase attributed to chemo med.  March Cr 0.78.  She wondered about possibly discontinuing or changing other medications.  She wonders if she really needs the Trintellix.  However after discussion we both agree now would not be a good time to change medication in view of pending bone marrow transplant in the next couple of months.  That will require her to be in [facility] for at least 2 to 3 months.  However we did discuss she could discontinue the mirtazapine if she wishes as long as her appetite and nausea do not develop into problems Again. Continue Trintellix to 10 mg daily. Continue or stop mirtazapine 30 mg tablet at night per her request. Weight loss resolved.  Counseled patient regarding  potential benefits, risks, and side effects of lithium to include potential risk of lithium affecting thyroid and renal function.  Discussed need for periodic lab monitoring to determine drug level and to assess for potential adverse effects.  Counseled patient regarding signs and symptoms of lithium toxicity and advised that they notify office immediately or seek urgent medical attention if experiencing these signs and symptoms.  Patient advised to contact office with any questions or concerns.  FU 3 mos  Lynder Parents, MD, DFAPA   Please see After Visit Summary for patient specific instructions.  No future appointments.  No orders of the defined types were placed in this encounter.     -------------------------------

## 2019-10-29 NOTE — Telephone Encounter (Signed)
Scheduled per los. Called and spoke with patient. Confirmed appt 

## 2019-10-30 ENCOUNTER — Other Ambulatory Visit: Payer: Self-pay | Admitting: *Deleted

## 2019-10-30 ENCOUNTER — Inpatient Hospital Stay: Payer: BC Managed Care – PPO

## 2019-10-30 ENCOUNTER — Other Ambulatory Visit: Payer: Self-pay

## 2019-10-30 VITALS — BP 104/64 | HR 84 | Temp 98.6°F | Resp 18

## 2019-10-30 DIAGNOSIS — N950 Postmenopausal bleeding: Secondary | ICD-10-CM | POA: Diagnosis not present

## 2019-10-30 DIAGNOSIS — Z452 Encounter for adjustment and management of vascular access device: Secondary | ICD-10-CM

## 2019-10-30 DIAGNOSIS — C9110 Chronic lymphocytic leukemia of B-cell type not having achieved remission: Secondary | ICD-10-CM | POA: Diagnosis not present

## 2019-10-30 DIAGNOSIS — I951 Orthostatic hypotension: Secondary | ICD-10-CM | POA: Diagnosis not present

## 2019-10-30 DIAGNOSIS — D61818 Other pancytopenia: Secondary | ICD-10-CM | POA: Diagnosis not present

## 2019-10-30 DIAGNOSIS — Z79899 Other long term (current) drug therapy: Secondary | ICD-10-CM | POA: Diagnosis not present

## 2019-10-30 DIAGNOSIS — Z95828 Presence of other vascular implants and grafts: Secondary | ICD-10-CM

## 2019-10-30 DIAGNOSIS — E875 Hyperkalemia: Secondary | ICD-10-CM | POA: Diagnosis not present

## 2019-10-30 DIAGNOSIS — D696 Thrombocytopenia, unspecified: Secondary | ICD-10-CM

## 2019-10-30 DIAGNOSIS — D251 Intramural leiomyoma of uterus: Secondary | ICD-10-CM | POA: Diagnosis not present

## 2019-10-30 DIAGNOSIS — E039 Hypothyroidism, unspecified: Secondary | ICD-10-CM | POA: Diagnosis not present

## 2019-10-30 LAB — BASIC METABOLIC PANEL - CANCER CENTER ONLY
Anion gap: 6 (ref 5–15)
BUN: 33 mg/dL — ABNORMAL HIGH (ref 6–20)
CO2: 23 mmol/L (ref 22–32)
Calcium: 9.6 mg/dL (ref 8.9–10.3)
Chloride: 110 mmol/L (ref 98–111)
Creatinine: 1 mg/dL (ref 0.44–1.00)
GFR, Est AFR Am: >60 mL/min (ref >60)
GFR, Estimated: >60 mL/min (ref >60)
Glucose, Bld: 111 mg/dL — ABNORMAL HIGH (ref 70–99)
Potassium: 4.7 mmol/L (ref 3.5–5.1)
Sodium: 139 mmol/L (ref 135–145)

## 2019-10-30 LAB — CBC WITH DIFFERENTIAL (CANCER CENTER ONLY)
Abs Immature Granulocytes: 0.01 10*3/uL (ref 0.00–0.07)
Basophils Absolute: 0 10*3/uL (ref 0.0–0.1)
Basophils Relative: 0 %
Eosinophils Absolute: 0.1 10*3/uL (ref 0.0–0.5)
Eosinophils Relative: 1 %
HCT: 27.3 % — ABNORMAL LOW (ref 36.0–46.0)
Hemoglobin: 9.2 g/dL — ABNORMAL LOW (ref 12.0–15.0)
Immature Granulocytes: 0 %
Lymphocytes Relative: 78 %
Lymphs Abs: 3.6 10*3/uL (ref 0.7–4.0)
MCH: 29.9 pg (ref 26.0–34.0)
MCHC: 33.7 g/dL (ref 30.0–36.0)
MCV: 88.6 fL (ref 80.0–100.0)
Monocytes Absolute: 0.2 10*3/uL (ref 0.1–1.0)
Monocytes Relative: 4 %
Neutro Abs: 0.8 10*3/uL — ABNORMAL LOW (ref 1.7–7.7)
Neutrophils Relative %: 17 %
Platelet Count: 10 10*3/uL — ABNORMAL LOW (ref 150–400)
RBC: 3.08 MIL/uL — ABNORMAL LOW (ref 3.87–5.11)
RDW: 13.7 % (ref 11.5–15.5)
WBC Count: 4.6 10*3/uL (ref 4.0–10.5)
nRBC: 0 % (ref 0.0–0.2)

## 2019-10-30 MED ORDER — SODIUM CHLORIDE 0.9% IV SOLUTION
250.0000 mL | Freq: Once | INTRAVENOUS | Status: AC
  Administered 2019-10-30: 250 mL via INTRAVENOUS
  Filled 2019-10-30: qty 250

## 2019-10-30 MED ORDER — SODIUM CHLORIDE 0.9% FLUSH
10.0000 mL | INTRAVENOUS | Status: AC | PRN
  Administered 2019-10-30: 10 mL
  Filled 2019-10-30: qty 10

## 2019-10-30 MED ORDER — SODIUM CHLORIDE 0.9% FLUSH
10.0000 mL | INTRAVENOUS | Status: DC | PRN
  Administered 2019-10-30: 10 mL
  Filled 2019-10-30: qty 10

## 2019-10-30 MED ORDER — HEPARIN SOD (PORK) LOCK FLUSH 100 UNIT/ML IV SOLN
500.0000 [IU] | Freq: Once | INTRAVENOUS | Status: AC
  Administered 2019-10-30: 500 [IU] via INTRAVENOUS
  Filled 2019-10-30: qty 5

## 2019-10-30 MED ORDER — HEPARIN SOD (PORK) LOCK FLUSH 100 UNIT/ML IV SOLN
500.0000 [IU] | Freq: Once | INTRAVENOUS | Status: AC | PRN
  Administered 2019-10-30: 250 [IU]
  Filled 2019-10-30: qty 5

## 2019-10-30 NOTE — Progress Notes (Signed)
Platelet count 10,000. Needs platelets today. Orders in and blood bank confirms. Will attempt to obtain platelets and call charge nurse if not able to locate any. Patient instructed to come to office at 3 pm for infusion. She has not yet taken the med to get her K+ level down, and K+ level today is normal at 4.7 Per Dr. Benay Spice: Continue Tacrolimus at 0.5 mg twice daily. Hold off on the Sorbitol since K+ is now normal.

## 2019-10-30 NOTE — Patient Instructions (Signed)

## 2019-10-30 NOTE — Patient Instructions (Signed)
PICC Home Care Guide  A peripherally inserted central catheter (PICC) is a form of IV access that allows medicines and IV fluids to be quickly distributed throughout the body. The PICC is a long, thin, flexible tube (catheter) that is inserted into a vein in the upper arm. The catheter ends in a large vein in the chest (superior vena cava, or SVC). After the PICC is inserted, a chest X-ray may be done to make sure that it is in the correct place. A PICC may be placed for different reasons, such as:  To give medicines and liquid nutrition.  To give IV fluids and blood products.  If there is trouble placing a peripheral intravenous (PIV) catheter. If taken care of properly, a PICC can remain in place for several months. Having a PICC can also allow a person to go home from the hospital sooner. Medicine and PICC care can be managed at home by a family member, caregiver, or home health care team. What are the risks? Generally, having a PICC is safe. However, problems may occur, including:  A blood clot (thrombus) forming in or at the tip of the PICC.  A blood clot forming in a vein (deep vein thrombosis) or traveling to the lung (pulmonary embolism).  Inflammation of the vein (phlebitis) in which the PICC is placed.  Infection. Central line associated blood stream infection (CLABSI) is a serious infection that often requires hospitalization.  PICC movement (malposition). The PICC tip may move from its original position due to excessive physical activity, forceful coughing, sneezing, or vomiting.  A break or cut in the PICC. It is important not to use scissors near the PICC.  Nerve or tendon irritation or injury during PICC insertion. How to take care of your PICC Preventing problems  You and any caregivers should wash your hands often with soap. Wash hands: ? Before touching the PICC line or the infusion device. ? Before changing a bandage (dressing).  Flush the PICC as told by your  health care provider. Let your health care provider know right away if the PICC is hard to flush or does not flush. Do not use force to flush the PICC.  Do not use a syringe that is less than 10 mL to flush the PICC.  Avoid blood pressure checks on the arm in which the PICC is placed.  Never pull or tug on the PICC.  Do not take the PICC out yourself. Only a trained clinical professional should remove the PICC.  Use clean and sterile supplies only. Keep the supplies in a dry place. Do not reuse needles, syringes, or any other supplies. Doing that can lead to infection.  Keep pets and children away from your PICC line.  Check the PICC insertion site every day for signs of infection. Check for: ? Leakage. ? Redness, swelling, or pain. ? Fluid or blood. ? Warmth. ? Pus or a bad smell. PICC dressing care  Keep your PICC bandage (dressing) clean and dry to prevent infection.  Do not take baths, swim, or use a hot tub until your health care provider approves. Ask your health care provider if you can take showers. You may only be allowed to take sponge baths for bathing. When you are allowed to shower: ? Ask your health care provider to teach you how to wrap the PICC line. ? Cover the PICC line with clear plastic wrap and tape to keep it dry while showering.  Follow instructions from your health care provider   about how to take care of your insertion site and dressing. Make sure you: ? Wash your hands with soap and water before you change your bandage (dressing). If soap and water are not available, use hand sanitizer. ? Change your dressing as told by your health care provider. ? Leave stitches (sutures), skin glue, or adhesive strips in place. These skin closures may need to stay in place for 2 weeks or longer. If adhesive strip edges start to loosen and curl up, you may trim the loose edges. Do not remove adhesive strips completely unless your health care provider tells you to do  that.  Change your PICC dressing if it becomes loose or wet. General instructions   Carry your PICC identification card or wear a medical alert bracelet at all times.  Keep the tube clamped at all times, unless it is being used.  Carry a smooth-edge clamp with you at all times to place on the tube if it breaks.  Do not use scissors or sharp objects near the tube.  You may bend your arm and move it freely. If your PICC is near or at the bend of your elbow, avoid activity with repeated motion at the elbow.  Avoid lifting heavy objects as told by your health care provider.  Keep all follow-up visits as told by your health care provider. This is important. Disposal of supplies  Throw away any syringes in a disposal container that is meant for sharp items (sharps container). You can buy a sharps container from a pharmacy, or you can make one by using an empty hard plastic bottle with a cover.  Place any used dressings or infusion bags into a plastic bag. Throw that bag in the trash. Contact a health care provider if:  You have pain in your arm, ear, face, or teeth.  You have a fever or chills.  You have redness, swelling, or pain around the insertion site.  You have fluid or blood coming from the insertion site.  Your insertion site feels warm to the touch.  You have pus or a bad smell coming from the insertion site.  Your skin feels hard and raised around the insertion site. Get help right away if:  Your PICC is accidentally pulled all the way out. If this happens, cover the insertion site with a bandage or gauze dressing. Do not throw the PICC away. Your health care provider will need to check it.  Your PICC was tugged or pulled and has partially come out. Do not  push the PICC back in.  You cannot flush the PICC, it is hard to flush, or the PICC leaks around the insertion site when it is flushed.  You hear a "flushing" sound when the PICC is flushed.  You feel your  heart racing or skipping beats.  There is a hole or tear in the PICC.  You have swelling in the arm in which the PICC was inserted.  You have a red streak going up your arm from where the PICC was inserted. Summary  A peripherally inserted central catheter (PICC) is a long, thin, flexible tube (catheter) that is inserted into a vein in the upper arm.  The PICC is inserted using a sterile technique by a specially trained nurse or physician. Only a trained clinical professional should remove it.  Keep your PICC identification card with you at all times.  Avoid blood pressure checks on the arm in which the PICC is placed.  If cared for   properly, a PICC can remain in place for several months. Having a PICC can also allow a person to go home from the hospital sooner. This information is not intended to replace advice given to you by your health care provider. Make sure you discuss any questions you have with your health care provider. Document Revised: 06/22/2017 Document Reviewed: 08/12/2016 Elsevier Patient Education  2020 [employer]  

## 2019-10-30 NOTE — Progress Notes (Signed)
Patient is requesting blood bank sample be drawn today. Should not be necessary.

## 2019-10-31 ENCOUNTER — Encounter: Payer: Self-pay | Admitting: *Deleted

## 2019-10-31 LAB — BPAM PLATELET PHERESIS
Blood Product Expiration Date: 202104102359
ISSUE DATE / TIME: 202104081510
Unit Type and Rh: 9500

## 2019-10-31 LAB — PREPARE PLATELET PHERESIS: Unit division: 0

## 2019-10-31 NOTE — Progress Notes (Signed)
At request of Heath Lark at [facility], faxed copy of recent ECG to fax [phone]. Phone [phone]

## 2019-11-01 LAB — TACROLIMUS LEVEL: Tacrolimus (FK506) - LabCorp: 3.3 ng/mL (ref 2.0–20.0)

## 2019-11-03 ENCOUNTER — Telehealth: Payer: Self-pay | Admitting: *Deleted

## 2019-11-03 NOTE — Telephone Encounter (Signed)
Called patient to remind her to hold her am tacrolimus for labs tomorrow. Reminder was requested by pharmacist at [facility]. Patient asking if office has received the [place] result and office note from her visit with Dr. Linda Hedges on 10/30/19?[name] Spoke with HIM for report and note.

## 2019-11-04 ENCOUNTER — Inpatient Hospital Stay: Payer: BC Managed Care – PPO

## 2019-11-04 ENCOUNTER — Inpatient Hospital Stay (HOSPITAL_BASED_OUTPATIENT_CLINIC_OR_DEPARTMENT_OTHER): Payer: BC Managed Care – PPO | Admitting: Oncology

## 2019-11-04 ENCOUNTER — Other Ambulatory Visit: Payer: Self-pay

## 2019-11-04 VITALS — BP 119/67 | HR 79 | Temp 98.5°F | Resp 18 | Ht 66.0 in | Wt 122.4 lb

## 2019-11-04 DIAGNOSIS — Z452 Encounter for adjustment and management of vascular access device: Secondary | ICD-10-CM

## 2019-11-04 DIAGNOSIS — D649 Anemia, unspecified: Secondary | ICD-10-CM | POA: Diagnosis not present

## 2019-11-04 DIAGNOSIS — Z95828 Presence of other vascular implants and grafts: Secondary | ICD-10-CM

## 2019-11-04 DIAGNOSIS — I951 Orthostatic hypotension: Secondary | ICD-10-CM | POA: Diagnosis not present

## 2019-11-04 DIAGNOSIS — E875 Hyperkalemia: Secondary | ICD-10-CM | POA: Diagnosis not present

## 2019-11-04 DIAGNOSIS — D61818 Other pancytopenia: Secondary | ICD-10-CM | POA: Diagnosis not present

## 2019-11-04 DIAGNOSIS — D696 Thrombocytopenia, unspecified: Secondary | ICD-10-CM

## 2019-11-04 DIAGNOSIS — C9110 Chronic lymphocytic leukemia of B-cell type not having achieved remission: Secondary | ICD-10-CM | POA: Diagnosis not present

## 2019-11-04 DIAGNOSIS — E039 Hypothyroidism, unspecified: Secondary | ICD-10-CM | POA: Diagnosis not present

## 2019-11-04 DIAGNOSIS — Z79899 Other long term (current) drug therapy: Secondary | ICD-10-CM | POA: Diagnosis not present

## 2019-11-04 DIAGNOSIS — Z7682 Awaiting organ transplant status: Principal | ICD-10-CM

## 2019-11-04 LAB — CBC WITH DIFFERENTIAL (CANCER CENTER ONLY)
Abs Immature Granulocytes: 0.01 10*3/uL (ref 0.00–0.07)
Basophils Absolute: 0 10*3/uL (ref 0.0–0.1)
Basophils Relative: 0 %
Eosinophils Absolute: 0 10*3/uL (ref 0.0–0.5)
Eosinophils Relative: 1 %
HCT: 22.6 % — ABNORMAL LOW (ref 36.0–46.0)
Hemoglobin: 7.8 g/dL — ABNORMAL LOW (ref 12.0–15.0)
Immature Granulocytes: 0 %
Lymphocytes Relative: 68 %
Lymphs Abs: 1.9 10*3/uL (ref 0.7–4.0)
MCH: 30.8 pg (ref 26.0–34.0)
MCHC: 34.5 g/dL (ref 30.0–36.0)
MCV: 89.3 fL (ref 80.0–100.0)
Monocytes Absolute: 0.1 10*3/uL (ref 0.1–1.0)
Monocytes Relative: 5 %
Neutro Abs: 0.7 10*3/uL — ABNORMAL LOW (ref 1.7–7.7)
Neutrophils Relative %: 26 %
Platelet Count: 15 10*3/uL — ABNORMAL LOW (ref 150–400)
RBC: 2.53 MIL/uL — ABNORMAL LOW (ref 3.87–5.11)
RDW: 13.2 % (ref 11.5–15.5)
WBC Count: 2.8 10*3/uL — ABNORMAL LOW (ref 4.0–10.5)
nRBC: 0 % (ref 0.0–0.2)

## 2019-11-04 LAB — CMP (CANCER CENTER ONLY)
ALT: 38 U/L (ref 0–44)
AST: 17 U/L (ref 15–41)
Albumin: 4 g/dL (ref 3.5–5.0)
Alkaline Phosphatase: 117 U/L (ref 38–126)
Anion gap: 10 (ref 5–15)
BUN: 34 mg/dL — ABNORMAL HIGH (ref 6–20)
CO2: 22 mmol/L (ref 22–32)
Calcium: 9 mg/dL (ref 8.9–10.3)
Chloride: 108 mmol/L (ref 98–111)
Creatinine: 1.06 mg/dL — ABNORMAL HIGH (ref 0.44–1.00)
GFR, Est AFR Am: >60 mL/min (ref >60)
GFR, Estimated: 57 mL/min — ABNORMAL LOW (ref >60)
Glucose, Bld: 169 mg/dL — ABNORMAL HIGH (ref 70–99)
Potassium: 4.3 mmol/L (ref 3.5–5.1)
Sodium: 140 mmol/L (ref 135–145)
Total Bilirubin: 0.5 mg/dL (ref 0.3–1.2)
Total Protein: 5.9 g/dL — ABNORMAL LOW (ref 6.5–8.1)

## 2019-11-04 LAB — SAMPLE TO BLOOD BANK

## 2019-11-04 LAB — MAGNESIUM: Magnesium: 1.9 mg/dL (ref 1.7–2.4)

## 2019-11-04 LAB — PREPARE RBC (CROSSMATCH)

## 2019-11-04 MED ORDER — HEPARIN SOD (PORK) LOCK FLUSH 100 UNIT/ML IV SOLN
250.0000 [IU] | INTRAVENOUS | Status: AC | PRN
  Administered 2019-11-04: 14:00:00 250 [IU]
  Filled 2019-11-04: qty 5

## 2019-11-04 MED ORDER — HEPARIN SOD (PORK) LOCK FLUSH 100 UNIT/ML IV SOLN
250.0000 [IU] | Freq: Once | INTRAVENOUS | Status: AC
  Administered 2019-11-04: 250 [IU]
  Filled 2019-11-04: qty 5

## 2019-11-04 MED ORDER — SODIUM CHLORIDE 0.9% FLUSH
10.0000 mL | INTRAVENOUS | Status: AC | PRN
  Administered 2019-11-04: 10 mL
  Filled 2019-11-04: qty 10

## 2019-11-04 MED ORDER — SODIUM CHLORIDE 0.9% IV SOLUTION
250.0000 mL | Freq: Once | INTRAVENOUS | Status: AC
  Administered 2019-11-04: 250 mL via INTRAVENOUS
  Filled 2019-11-04: qty 250

## 2019-11-04 MED ORDER — SODIUM CHLORIDE 0.9% FLUSH
10.0000 mL | INTRAVENOUS | Status: DC | PRN
  Administered 2019-11-04: 10 mL
  Filled 2019-11-04: qty 10

## 2019-11-04 NOTE — Patient Instructions (Signed)
Please provide copy of your Medical Advanced Directive to have scanned into your record. 

## 2019-11-04 NOTE — Patient Instructions (Signed)

## 2019-11-04 NOTE — Patient Instructions (Signed)
PICC Home Care Guide  A peripherally inserted central catheter (PICC) is a form of IV access that allows medicines and IV fluids to be quickly distributed throughout the body. The PICC is a long, thin, flexible tube (catheter) that is inserted into a vein in the upper arm. The catheter ends in a large vein in the chest (superior vena cava, or SVC). After the PICC is inserted, a chest X-ray may be done to make sure that it is in the correct place. A PICC may be placed for different reasons, such as:  To give medicines and liquid nutrition.  To give IV fluids and blood products.  If there is trouble placing a peripheral intravenous (PIV) catheter. If taken care of properly, a PICC can remain in place for several months. Having a PICC can also allow a person to go home from the hospital sooner. Medicine and PICC care can be managed at home by a family member, caregiver, or home health care team. What are the risks? Generally, having a PICC is safe. However, problems may occur, including:  A blood clot (thrombus) forming in or at the tip of the PICC.  A blood clot forming in a vein (deep vein thrombosis) or traveling to the lung (pulmonary embolism).  Inflammation of the vein (phlebitis) in which the PICC is placed.  Infection. Central line associated blood stream infection (CLABSI) is a serious infection that often requires hospitalization.  PICC movement (malposition). The PICC tip may move from its original position due to excessive physical activity, forceful coughing, sneezing, or vomiting.  A break or cut in the PICC. It is important not to use scissors near the PICC.  Nerve or tendon irritation or injury during PICC insertion. How to take care of your PICC Preventing problems  You and any caregivers should wash your hands often with soap. Wash hands: ? Before touching the PICC line or the infusion device. ? Before changing a bandage (dressing).  Flush the PICC as told by your  health care provider. Let your health care provider know right away if the PICC is hard to flush or does not flush. Do not use force to flush the PICC.  Do not use a syringe that is less than 10 mL to flush the PICC.  Avoid blood pressure checks on the arm in which the PICC is placed.  Never pull or tug on the PICC.  Do not take the PICC out yourself. Only a trained clinical professional should remove the PICC.  Use clean and sterile supplies only. Keep the supplies in a dry place. Do not reuse needles, syringes, or any other supplies. Doing that can lead to infection.  Keep pets and children away from your PICC line.  Check the PICC insertion site every day for signs of infection. Check for: ? Leakage. ? Redness, swelling, or pain. ? Fluid or blood. ? Warmth. ? Pus or a bad smell. PICC dressing care  Keep your PICC bandage (dressing) clean and dry to prevent infection.  Do not take baths, swim, or use a hot tub until your health care provider approves. Ask your health care provider if you can take showers. You may only be allowed to take sponge baths for bathing. When you are allowed to shower: ? Ask your health care provider to teach you how to wrap the PICC line. ? Cover the PICC line with clear plastic wrap and tape to keep it dry while showering.  Follow instructions from your health care provider   about how to take care of your insertion site and dressing. Make sure you: ? Wash your hands with soap and water before you change your bandage (dressing). If soap and water are not available, use hand sanitizer. ? Change your dressing as told by your health care provider. ? Leave stitches (sutures), skin glue, or adhesive strips in place. These skin closures may need to stay in place for 2 weeks or longer. If adhesive strip edges start to loosen and curl up, you may trim the loose edges. Do not remove adhesive strips completely unless your health care provider tells you to do  that.  Change your PICC dressing if it becomes loose or wet. General instructions   Carry your PICC identification card or wear a medical alert bracelet at all times.  Keep the tube clamped at all times, unless it is being used.  Carry a smooth-edge clamp with you at all times to place on the tube if it breaks.  Do not use scissors or sharp objects near the tube.  You may bend your arm and move it freely. If your PICC is near or at the bend of your elbow, avoid activity with repeated motion at the elbow.  Avoid lifting heavy objects as told by your health care provider.  Keep all follow-up visits as told by your health care provider. This is important. Disposal of supplies  Throw away any syringes in a disposal container that is meant for sharp items (sharps container). You can buy a sharps container from a pharmacy, or you can make one by using an empty hard plastic bottle with a cover.  Place any used dressings or infusion bags into a plastic bag. Throw that bag in the trash. Contact a health care provider if:  You have pain in your arm, ear, face, or teeth.  You have a fever or chills.  You have redness, swelling, or pain around the insertion site.  You have fluid or blood coming from the insertion site.  Your insertion site feels warm to the touch.  You have pus or a bad smell coming from the insertion site.  Your skin feels hard and raised around the insertion site. Get help right away if:  Your PICC is accidentally pulled all the way out. If this happens, cover the insertion site with a bandage or gauze dressing. Do not throw the PICC away. Your health care provider will need to check it.  Your PICC was tugged or pulled and has partially come out. Do not  push the PICC back in.  You cannot flush the PICC, it is hard to flush, or the PICC leaks around the insertion site when it is flushed.  You hear a "flushing" sound when the PICC is flushed.  You feel your  heart racing or skipping beats.  There is a hole or tear in the PICC.  You have swelling in the arm in which the PICC was inserted.  You have a red streak going up your arm from where the PICC was inserted. Summary  A peripherally inserted central catheter (PICC) is a long, thin, flexible tube (catheter) that is inserted into a vein in the upper arm.  The PICC is inserted using a sterile technique by a specially trained nurse or physician. Only a trained clinical professional should remove it.  Keep your PICC identification card with you at all times.  Avoid blood pressure checks on the arm in which the PICC is placed.  If cared for   properly, a PICC can remain in place for several months. Having a PICC can also allow a person to go home from the hospital sooner. This information is not intended to replace advice given to you by your health care provider. Make sure you discuss any questions you have with your health care provider. Document Revised: 06/22/2017 Document Reviewed: 08/12/2016 Elsevier Patient Education  2020 [employer]  

## 2019-11-04 NOTE — Progress Notes (Signed)
[facility] OFFICE PROGRESS NOTE   Diagnosis: CLL, aplastic anemia  INTERVAL HISTORY:   Meagan Walsh returns as scheduled.  She feels well.  No nausea or diarrhea.  No fever or bleeding.  Vaginal leading has improved.  She now has spotting.  She has been evaluated by gynecology.  A pelvic ultrasound on 10/30/2019 revealed thickened endometrium, a possible polypoid mass, and intramural fibroid.  Gynecology plans and endometrial biopsy.  Tacrolimus was discontinued on 10/28/2019 when she presented with an elevated creatinine and hyperkalemia.  She resumed tacrolimus at a dose of 0.5 mg twice daily on 10/29/2019.  Ms. Daza has a good appetite.  She is exercising.  She has exertional dyspnea.  Objective:  Vital signs in last 24 hours:  Blood pressure 119/67, pulse 79, temperature 98.5 F (36.9 C), temperature source Oral, resp. rate 18, height '5\' 6"'  (1.676 m), weight 122 lb 6.4 oz (55.5 kg), SpO2 100 %.    HEENT: Mild gum hypertrophy, no bleeding or thrush Resp: Lungs clear bilaterally Cardio: Regular rate and rhythm GI: No hepatosplenomegaly Vascular: No leg edema  Skin: Resolving ecchymoses over the legs, no petechiae  Portacath/PICC-without erythema  Lab Results:  Lab Results  Component Value Date   WBC 2.8 (L) 11/04/2019   HGB 7.8 (L) 11/04/2019   HCT 22.6 (L) 11/04/2019   MCV 89.3 11/04/2019   PLT 15 (L) 11/04/2019   NEUTROABS 0.7 (L) 11/04/2019    CMP  Lab Results  Component Value Date   NA 140 11/04/2019   K 4.3 11/04/2019   CL 108 11/04/2019   CO2 22 11/04/2019   GLUCOSE 169 (H) 11/04/2019   BUN 34 (H) 11/04/2019   CREATININE 1.06 (H) 11/04/2019   CALCIUM 9.0 11/04/2019   PROT 5.9 (L) 11/04/2019   ALBUMIN 4.0 11/04/2019   AST 17 11/04/2019   ALT 38 11/04/2019   ALKPHOS 117 11/04/2019   BILITOT 0.5 11/04/2019   GFRNONAA 57 (L) 11/04/2019   GFRAA >60 11/04/2019    Medications: I have reviewed the patient's current  medications.   Assessment/Plan: 1. 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 Mild splenomegaly.  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, no evidence of 11q22deletion  Bone marrow biopsy 02/26/2018-hypercellular marrow with extensive involvement by CLL, lymphocytes represent85% of all cells  Ibrutinib initiated 04/03/2018  Ibrutinib placed on hold 04/11/2018 due to onset of arthralgias  Ibrutinib resumed 04/16/2018, discontinued 04/25/2018 secondary to severe arthralgias/arthritis  Ibrutinib resumed at a dose of 140 mg daily 05/03/2018  Ibrutinib dose adjusted to 140 mg alternating with 220m 06/25/2018  Ibrutinib discontinued 07/03/2018 secondary to severe arthralgias  Acalabrutinib 08/16/2018, discontinued 11/15/2018 secondary to persistent severe transfusion dependent anemia and neutropenia/thrombocytopenia, last dose 11/14/2018  Bone marrow biopsy 11/21/2018-decreased cellularity, involvement by CLL, decreased erythroid and granulocytic precursors, decreased megakaryocytes  Cycle 1 rituximab 12/06/2018  Bone marrow biopsy 12/24/2018 at [facility]-hypocellular bone  marrow (10%) involved by CLL, representing 50% of marrow cellularity; markedly decreased trilineage hematopoiesis including essentially absent erythropoiesis  Bone marrow biopsy 03/06/2027 [facility]-hypocellular bone marrow (20%) with marked reduction of maturing hematopoietic elements; numerous lymphoid aggregates consistent with involvement by CLL, representing approximately 50% of marrow cellularity by flow cytometry  ATG/cyclosporine at [facility] beginning 03/19/2019 followed by prednisone taper  Eltrombopag beginning 07/05/2019  Cyclosporine discontinued 07/08/2019  Tacrolimus 07/10/2019, discontinued 09/24/2019  Bone marrow biopsy 10/07/2019 at [facility] bone marrow sampling (<5% cellular) essentially devoid of hematopoietic elements; Persistent chronic lymphocytic leukemia, representing 23% of marrow cellularity by flow cytometric analysis; Routine cytogenetic analysis is pending.  CT CAP 10/10/2019 showed no acute process or evidence of active lymphoma/leukemia within             the chest, abdomen, or pelvis. Resection or resolution of previously described necrotic left             inguinal node  Tacrolimus resumed 10/17/2019, discontinued on 10/28/2019 and resumed at a dose of 0.5 mg twice daily on 10/29/2019  2.Hypothyroidism 3.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 Augmentin prescribed. 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 Pancytopenia secondary to CLL and a hypocellular bone marrow  G-CSF and Nplate started 9/32/6712, G-CSF changed to daily beginning 12/17/2018; G-CSF discontinued  12/31/2018, last Nplate 01/15/2019  Began prednisone 60 mg daily 01/11/19, tolerating moderately well except jitteriness, irritability, and difficulty sleeping. Tapered to 40 mg daily x1 week starting 01/24/19, reduce by 10 mg each week until discontinued. Prednisone discontinued 02/20/2019.  Promacta started 01/20/2019, dose increased to 100 mg daily 02/04/2019; dose increased to 150 mg daily 02/21/2019  IVIG daily for 2 days beginning 02/25/2019  9. 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 No lymphadenopathy or splenomegaly. Exjade beginning 06/28/2019. 13. Elevated creatinine-cyclosporine toxicity?  Elevated 10/28/2019 with hyperkalemia-tacrolimus induced? 14.Vaginal bleeding beginning 07/13/2019-Mirena device removed, uterine fibroids and possible endometrial fluid or thickening in the fundus per CT CAP on 10/10/19 15.Elevated lithium level 08/08/2019, lithium toxicity?-Lithium held 08/08/2019-08/12/2019, then resumed at a lower dose 16. Deconditioning, secondary to n/v/d 17. Orthostatic hypotension 09/15/19,received500 cc NS in clinic    Disposition: Ms. Deem appears stable.  Her performance status continues to improve.  The platelet count is low, but she will not require a platelet transfusion today.  She will receive 1 unit of red cells for treatment of the symptomatic anemia.  Neutropenia has improved over the past week.  She will continue tacrolimus at a dose of 0.5 mg twice daily.  She continues Promacta.  She will return for a CBC on 11/07/2019.  She continues antibiotic prophylaxis.  We discontinued Bactrim secondary to the elevated creatinine and improvement in the neutrophil count.  I will contact Dr. Oretha Ellis to discuss the status of the bone marrow  transplant.  Ms. Harner will return for an office visit in 1 week.  Betsy Coder, MD  11/04/2019  11:49 AM

## 2019-11-05 ENCOUNTER — Ambulatory Visit: Admit: 2019-11-05 | Discharge: 2019-11-06 | Payer: PRIVATE HEALTH INSURANCE

## 2019-11-05 ENCOUNTER — Encounter: Admit: 2019-11-05 | Discharge: 2019-11-06 | Payer: PRIVATE HEALTH INSURANCE

## 2019-11-05 ENCOUNTER — Telehealth: Payer: Self-pay | Admitting: Oncology

## 2019-11-05 DIAGNOSIS — C9110 Chronic lymphocytic leukemia of B-cell type not having achieved remission: Secondary | ICD-10-CM | POA: Diagnosis not present

## 2019-11-05 DIAGNOSIS — Z7682 Awaiting organ transplant status: Secondary | ICD-10-CM | POA: Diagnosis not present

## 2019-11-05 DIAGNOSIS — C9112 Chronic lymphocytic leukemia of B-cell type in relapse: Secondary | ICD-10-CM | POA: Diagnosis not present

## 2019-11-05 LAB — TYPE AND SCREEN
ABO/RH(D): O POS
Antibody Screen: NEGATIVE
Unit division: 0

## 2019-11-05 LAB — BPAM RBC
Blood Product Expiration Date: 202105112359
ISSUE DATE / TIME: 202104131200
Unit Type and Rh: 5100

## 2019-11-05 NOTE — Telephone Encounter (Signed)
Scheduled per los. Called and spoke with patient. Confirmed appts  

## 2019-11-06 ENCOUNTER — Other Ambulatory Visit: Payer: Self-pay | Admitting: *Deleted

## 2019-11-06 DIAGNOSIS — C9110 Chronic lymphocytic leukemia of B-cell type not having achieved remission: Secondary | ICD-10-CM

## 2019-11-06 LAB — TACROLIMUS LEVEL: Tacrolimus (FK506) - LabCorp: 2.7 ng/mL (ref 2.0–20.0)

## 2019-11-06 MED FILL — PROMACTA 50 MG TABLET: 50 | 30 days supply | Qty: 90 | Fill #1

## 2019-11-07 ENCOUNTER — Inpatient Hospital Stay: Payer: BC Managed Care – PPO

## 2019-11-07 ENCOUNTER — Other Ambulatory Visit: Payer: Self-pay | Admitting: *Deleted

## 2019-11-07 ENCOUNTER — Other Ambulatory Visit: Payer: Self-pay

## 2019-11-07 DIAGNOSIS — C9110 Chronic lymphocytic leukemia of B-cell type not having achieved remission: Secondary | ICD-10-CM | POA: Diagnosis not present

## 2019-11-07 DIAGNOSIS — D61818 Other pancytopenia: Secondary | ICD-10-CM | POA: Diagnosis not present

## 2019-11-07 DIAGNOSIS — E039 Hypothyroidism, unspecified: Secondary | ICD-10-CM | POA: Diagnosis not present

## 2019-11-07 DIAGNOSIS — I951 Orthostatic hypotension: Secondary | ICD-10-CM | POA: Diagnosis not present

## 2019-11-07 DIAGNOSIS — Z452 Encounter for adjustment and management of vascular access device: Secondary | ICD-10-CM

## 2019-11-07 DIAGNOSIS — E875 Hyperkalemia: Secondary | ICD-10-CM | POA: Diagnosis not present

## 2019-11-07 DIAGNOSIS — Z79899 Other long term (current) drug therapy: Secondary | ICD-10-CM | POA: Diagnosis not present

## 2019-11-07 DIAGNOSIS — D649 Anemia, unspecified: Secondary | ICD-10-CM

## 2019-11-07 DIAGNOSIS — D696 Thrombocytopenia, unspecified: Secondary | ICD-10-CM

## 2019-11-07 DIAGNOSIS — Z95828 Presence of other vascular implants and grafts: Secondary | ICD-10-CM

## 2019-11-07 LAB — CBC WITH DIFFERENTIAL (CANCER CENTER ONLY)
Abs Immature Granulocytes: 0.01 10*3/uL (ref 0.00–0.07)
Basophils Absolute: 0 10*3/uL (ref 0.0–0.1)
Basophils Relative: 0 %
Eosinophils Absolute: 0 10*3/uL (ref 0.0–0.5)
Eosinophils Relative: 1 %
HCT: 26.9 % — ABNORMAL LOW (ref 36.0–46.0)
Hemoglobin: 9.2 g/dL — ABNORMAL LOW (ref 12.0–15.0)
Immature Granulocytes: 0 %
Lymphocytes Relative: 67 %
Lymphs Abs: 2.3 10*3/uL (ref 0.7–4.0)
MCH: 29.9 pg (ref 26.0–34.0)
MCHC: 34.2 g/dL (ref 30.0–36.0)
MCV: 87.3 fL (ref 80.0–100.0)
Monocytes Absolute: 0.2 10*3/uL (ref 0.1–1.0)
Monocytes Relative: 6 %
Neutro Abs: 0.9 10*3/uL — ABNORMAL LOW (ref 1.7–7.7)
Neutrophils Relative %: 26 %
Platelet Count: 10 10*3/uL — ABNORMAL LOW (ref 150–400)
RBC: 3.08 MIL/uL — ABNORMAL LOW (ref 3.87–5.11)
RDW: 13.4 % (ref 11.5–15.5)
WBC Count: 3.5 10*3/uL — ABNORMAL LOW (ref 4.0–10.5)
nRBC: 0 % (ref 0.0–0.2)

## 2019-11-07 LAB — SAMPLE TO BLOOD BANK

## 2019-11-07 MED ORDER — HEPARIN SOD (PORK) LOCK FLUSH 100 UNIT/ML IV SOLN
250.0000 [IU] | Freq: Once | INTRAVENOUS | Status: DC
  Filled 2019-11-07: qty 5

## 2019-11-07 MED ORDER — SODIUM CHLORIDE 0.9% FLUSH
10.0000 mL | INTRAVENOUS | Status: DC | PRN
  Filled 2019-11-07: qty 10

## 2019-11-07 NOTE — Progress Notes (Signed)
Platelet count 10,000 today. Needs platelets today or tomorrow. Patient prefers Saturday. Scheduling notified to make appointment and blood bank called to have platelets available for tomorrow morning. Patient notified to arrive at 10 am for platelets Saturday.

## 2019-11-08 ENCOUNTER — Inpatient Hospital Stay: Payer: BC Managed Care – PPO

## 2019-11-08 ENCOUNTER — Other Ambulatory Visit: Payer: Self-pay

## 2019-11-08 VITALS — BP 113/58 | HR 78 | Temp 98.1°F | Resp 16

## 2019-11-08 DIAGNOSIS — I951 Orthostatic hypotension: Secondary | ICD-10-CM | POA: Diagnosis not present

## 2019-11-08 DIAGNOSIS — D696 Thrombocytopenia, unspecified: Secondary | ICD-10-CM

## 2019-11-08 DIAGNOSIS — C9110 Chronic lymphocytic leukemia of B-cell type not having achieved remission: Secondary | ICD-10-CM | POA: Diagnosis not present

## 2019-11-08 DIAGNOSIS — D61818 Other pancytopenia: Secondary | ICD-10-CM | POA: Diagnosis not present

## 2019-11-08 DIAGNOSIS — E039 Hypothyroidism, unspecified: Secondary | ICD-10-CM | POA: Diagnosis not present

## 2019-11-08 DIAGNOSIS — E875 Hyperkalemia: Secondary | ICD-10-CM | POA: Diagnosis not present

## 2019-11-08 DIAGNOSIS — Z79899 Other long term (current) drug therapy: Secondary | ICD-10-CM | POA: Diagnosis not present

## 2019-11-08 DIAGNOSIS — Z452 Encounter for adjustment and management of vascular access device: Secondary | ICD-10-CM

## 2019-11-08 DIAGNOSIS — Z95828 Presence of other vascular implants and grafts: Secondary | ICD-10-CM

## 2019-11-08 MED ORDER — SODIUM CHLORIDE 0.9% FLUSH
10.0000 mL | INTRAVENOUS | Status: AC | PRN
  Administered 2019-11-08: 11:00:00 10 mL
  Filled 2019-11-08: qty 10

## 2019-11-08 MED ORDER — HEPARIN SOD (PORK) LOCK FLUSH 100 UNIT/ML IV SOLN
250.0000 [IU] | INTRAVENOUS | Status: AC | PRN
  Administered 2019-11-08: 11:00:00 250 [IU]
  Filled 2019-11-08: qty 5

## 2019-11-08 MED ORDER — SODIUM CHLORIDE 0.9% FLUSH
10.0000 mL | INTRAVENOUS | Status: DC | PRN
  Filled 2019-11-08: qty 10

## 2019-11-08 MED ORDER — SODIUM CHLORIDE 0.9% IV SOLUTION
250.0000 mL | Freq: Once | INTRAVENOUS | Status: AC
  Administered 2019-11-08: 10:00:00 250 mL via INTRAVENOUS
  Filled 2019-11-08: qty 250

## 2019-11-08 MED ORDER — SODIUM CHLORIDE 0.9% IV SOLUTION
250.0000 mL | Freq: Once | INTRAVENOUS | Status: DC
  Filled 2019-11-08: qty 250

## 2019-11-08 MED ORDER — HEPARIN SOD (PORK) LOCK FLUSH 100 UNIT/ML IV SOLN
500.0000 [IU] | Freq: Once | INTRAVENOUS | Status: DC | PRN
  Filled 2019-11-08: qty 5

## 2019-11-08 NOTE — Patient Instructions (Signed)

## 2019-11-10 ENCOUNTER — Telehealth: Payer: Self-pay | Admitting: *Deleted

## 2019-11-10 DIAGNOSIS — R87612 Low grade squamous intraepithelial lesion on cytologic smear of cervix (LGSIL): Secondary | ICD-10-CM | POA: Diagnosis not present

## 2019-11-10 DIAGNOSIS — N950 Postmenopausal bleeding: Secondary | ICD-10-CM | POA: Diagnosis not present

## 2019-11-10 DIAGNOSIS — N870 Mild cervical dysplasia: Secondary | ICD-10-CM | POA: Diagnosis not present

## 2019-11-10 DIAGNOSIS — D619 Aplastic anemia, unspecified: Principal | ICD-10-CM

## 2019-11-10 LAB — BPAM PLATELET PHERESIS
Blood Product Expiration Date: 202104182359
ISSUE DATE / TIME: 202104171010
Unit Type and Rh: 5100

## 2019-11-10 LAB — PREPARE PLATELET PHERESIS: Unit division: 0

## 2019-11-10 NOTE — Telephone Encounter (Signed)
Called to confirm her appointment at [facility]. Goes to [facility] B[facility] physician tomorrow. Notified her that she has lab/flush/OV on 11/12/19 at 0930. She has not been able to get into her Mychart account. Provided her the phone # for the help desk.

## 2019-11-11 ENCOUNTER — Encounter: Admit: 2019-11-11 | Discharge: 2019-11-12 | Payer: PRIVATE HEALTH INSURANCE

## 2019-11-11 ENCOUNTER — Telehealth: Payer: Self-pay | Admitting: *Deleted

## 2019-11-11 DIAGNOSIS — D801 Nonfamilial hypogammaglobulinemia: Secondary | ICD-10-CM | POA: Diagnosis not present

## 2019-11-11 DIAGNOSIS — J189 Pneumonia, unspecified organism: Secondary | ICD-10-CM | POA: Diagnosis not present

## 2019-11-11 DIAGNOSIS — C9110 Chronic lymphocytic leukemia of B-cell type not having achieved remission: Secondary | ICD-10-CM | POA: Diagnosis not present

## 2019-11-11 DIAGNOSIS — Z6820 Body mass index (BMI) 20.0-20.9, adult: Secondary | ICD-10-CM | POA: Diagnosis not present

## 2019-11-11 DIAGNOSIS — I509 Heart failure, unspecified: Secondary | ICD-10-CM | POA: Diagnosis not present

## 2019-11-11 DIAGNOSIS — F419 Anxiety disorder, unspecified: Secondary | ICD-10-CM | POA: Diagnosis not present

## 2019-11-11 DIAGNOSIS — I4891 Unspecified atrial fibrillation: Secondary | ICD-10-CM | POA: Diagnosis not present

## 2019-11-11 DIAGNOSIS — Z609 Problem related to social environment, unspecified: Secondary | ICD-10-CM | POA: Diagnosis not present

## 2019-11-11 DIAGNOSIS — D591 Autoimmune hemolytic anemia, unspecified: Secondary | ICD-10-CM | POA: Diagnosis not present

## 2019-11-11 DIAGNOSIS — Z9221 Personal history of antineoplastic chemotherapy: Secondary | ICD-10-CM | POA: Diagnosis not present

## 2019-11-11 DIAGNOSIS — B1910 Unspecified viral hepatitis B without hepatic coma: Secondary | ICD-10-CM | POA: Diagnosis not present

## 2019-11-11 DIAGNOSIS — Z7682 Awaiting organ transplant status: Secondary | ICD-10-CM | POA: Diagnosis not present

## 2019-11-11 DIAGNOSIS — E669 Obesity, unspecified: Secondary | ICD-10-CM | POA: Diagnosis not present

## 2019-11-11 DIAGNOSIS — M329 Systemic lupus erythematosus, unspecified: Secondary | ICD-10-CM | POA: Diagnosis not present

## 2019-11-11 DIAGNOSIS — I639 Cerebral infarction, unspecified: Secondary | ICD-10-CM | POA: Diagnosis not present

## 2019-11-11 DIAGNOSIS — K279 Peptic ulcer, site unspecified, unspecified as acute or chronic, without hemorrhage or perforation: Secondary | ICD-10-CM | POA: Diagnosis not present

## 2019-11-11 DIAGNOSIS — I2510 Atherosclerotic heart disease of native coronary artery without angina pectoris: Secondary | ICD-10-CM | POA: Diagnosis not present

## 2019-11-11 DIAGNOSIS — D619 Aplastic anemia, unspecified: Secondary | ICD-10-CM | POA: Diagnosis not present

## 2019-11-11 NOTE — Telephone Encounter (Signed)
Left VM to cancel her appointments tomorrow since Dr. Renold Genta drew ~ 15 tubes blood today at [facility]. Does she need to come in Friday and need appointment for next week as well? Per Dr. Benay Spice: cancel 4/21. Lab/flush on 4/23 and Lab/flush/OV on 4/27. She reports that [facility] did draw the tacrolimus level today. Patient notified of appointment plans and high priority scheduling message sent.

## 2019-11-12 ENCOUNTER — Other Ambulatory Visit: Payer: BC Managed Care – PPO

## 2019-11-12 ENCOUNTER — Telehealth: Payer: Self-pay | Admitting: Nurse Practitioner

## 2019-11-12 ENCOUNTER — Ambulatory Visit: Payer: BC Managed Care – PPO | Admitting: Nurse Practitioner

## 2019-11-12 ENCOUNTER — Inpatient Hospital Stay: Payer: BC Managed Care – PPO

## 2019-11-12 ENCOUNTER — Inpatient Hospital Stay: Payer: BC Managed Care – PPO | Admitting: Nurse Practitioner

## 2019-11-12 NOTE — Telephone Encounter (Signed)
Scheduled appt per 4/20 sch message - pt aware of appt dates and times

## 2019-11-14 ENCOUNTER — Inpatient Hospital Stay: Payer: BC Managed Care – PPO

## 2019-11-14 ENCOUNTER — Other Ambulatory Visit: Payer: Self-pay

## 2019-11-14 ENCOUNTER — Telehealth: Payer: Self-pay

## 2019-11-14 ENCOUNTER — Telehealth: Payer: Self-pay | Admitting: Psychiatry

## 2019-11-14 DIAGNOSIS — I951 Orthostatic hypotension: Secondary | ICD-10-CM | POA: Diagnosis not present

## 2019-11-14 DIAGNOSIS — E875 Hyperkalemia: Secondary | ICD-10-CM | POA: Diagnosis not present

## 2019-11-14 DIAGNOSIS — Z79899 Other long term (current) drug therapy: Secondary | ICD-10-CM | POA: Diagnosis not present

## 2019-11-14 DIAGNOSIS — D649 Anemia, unspecified: Secondary | ICD-10-CM

## 2019-11-14 DIAGNOSIS — C9110 Chronic lymphocytic leukemia of B-cell type not having achieved remission: Secondary | ICD-10-CM | POA: Diagnosis not present

## 2019-11-14 DIAGNOSIS — D61818 Other pancytopenia: Secondary | ICD-10-CM | POA: Diagnosis not present

## 2019-11-14 DIAGNOSIS — D696 Thrombocytopenia, unspecified: Secondary | ICD-10-CM

## 2019-11-14 DIAGNOSIS — Z95828 Presence of other vascular implants and grafts: Secondary | ICD-10-CM

## 2019-11-14 DIAGNOSIS — E039 Hypothyroidism, unspecified: Secondary | ICD-10-CM | POA: Diagnosis not present

## 2019-11-14 DIAGNOSIS — Z452 Encounter for adjustment and management of vascular access device: Secondary | ICD-10-CM

## 2019-11-14 LAB — CBC WITH DIFFERENTIAL (CANCER CENTER ONLY)
Abs Immature Granulocytes: 0 10*3/uL (ref 0.00–0.07)
Basophils Absolute: 0 10*3/uL (ref 0.0–0.1)
Basophils Relative: 0 %
Eosinophils Absolute: 0 10*3/uL (ref 0.0–0.5)
Eosinophils Relative: 0 %
HCT: 21.7 % — ABNORMAL LOW (ref 36.0–46.0)
Hemoglobin: 7.5 g/dL — ABNORMAL LOW (ref 12.0–15.0)
Immature Granulocytes: 0 %
Lymphocytes Relative: 63 %
Lymphs Abs: 1.5 10*3/uL (ref 0.7–4.0)
MCH: 30.5 pg (ref 26.0–34.0)
MCHC: 34.6 g/dL (ref 30.0–36.0)
MCV: 88.2 fL (ref 80.0–100.0)
Monocytes Absolute: 0.2 10*3/uL (ref 0.1–1.0)
Monocytes Relative: 7 %
Neutro Abs: 0.7 10*3/uL — ABNORMAL LOW (ref 1.7–7.7)
Neutrophils Relative %: 30 %
Platelet Count: 12 10*3/uL — ABNORMAL LOW (ref 150–400)
RBC: 2.46 MIL/uL — ABNORMAL LOW (ref 3.87–5.11)
RDW: 12.9 % (ref 11.5–15.5)
WBC Count: 2.3 10*3/uL — ABNORMAL LOW (ref 4.0–10.5)
nRBC: 0 % (ref 0.0–0.2)

## 2019-11-14 LAB — SAMPLE TO BLOOD BANK

## 2019-11-14 LAB — CMP (CANCER CENTER ONLY)
ALT: 50 U/L — ABNORMAL HIGH (ref 0–44)
AST: 27 U/L (ref 15–41)
Albumin: 3.8 g/dL (ref 3.5–5.0)
Alkaline Phosphatase: 112 U/L (ref 38–126)
Anion gap: 7 (ref 5–15)
BUN: 25 mg/dL — ABNORMAL HIGH (ref 6–20)
CO2: 25 mmol/L (ref 22–32)
Calcium: 9.2 mg/dL (ref 8.9–10.3)
Chloride: 109 mmol/L (ref 98–111)
Creatinine: 0.88 mg/dL (ref 0.44–1.00)
GFR, Est AFR Am: >60 mL/min (ref >60)
GFR, Estimated: >60 mL/min (ref >60)
Glucose, Bld: 121 mg/dL — ABNORMAL HIGH (ref 70–99)
Potassium: 4.3 mmol/L (ref 3.5–5.1)
Sodium: 141 mmol/L (ref 135–145)
Total Bilirubin: 0.5 mg/dL (ref 0.3–1.2)
Total Protein: 5.7 g/dL — ABNORMAL LOW (ref 6.5–8.1)

## 2019-11-14 LAB — MAGNESIUM: Magnesium: 1.9 mg/dL (ref 1.7–2.4)

## 2019-11-14 MED ORDER — SODIUM CHLORIDE 0.9% FLUSH
10.0000 mL | INTRAVENOUS | Status: DC | PRN
  Administered 2019-11-14: 10 mL
  Filled 2019-11-14: qty 10

## 2019-11-14 MED ORDER — HEPARIN SOD (PORK) LOCK FLUSH 100 UNIT/ML IV SOLN
250.0000 [IU] | Freq: Once | INTRAVENOUS | Status: AC
  Administered 2019-11-14: 250 [IU]
  Filled 2019-11-14: qty 5

## 2019-11-14 NOTE — Telephone Encounter (Signed)
Returned pt's call as requested regarding her appointments this next week

## 2019-11-14 NOTE — Telephone Encounter (Signed)
Spoke with pt regarding her CBC results today. Pt verbalizes understanding and states that she does not feel symptomatic enough to receive PRBCs today, ok per Ned Card, NP. Pt will come in for lab work on 11/17/19 per Ned Card, NP. Pt verbalizes understanding and agrees with plan of care.

## 2019-11-14 NOTE — Telephone Encounter (Signed)
Telephone call from [facility] bone marrow transplant team physician.  They requested information regarding patient's psychiatric condition in relation to her potential participation in self-care after bone marrow transplant. It was communicated that the patient has been psychiatrically asymptomatic for a minimum of 2 and potentially 3 years.  Once lithium was added to her antidepressant her depression resolved within 2 weeks and has not recurred.  She has been through stressors since that occasion most dramatically the diagnosis of leukemia and has handled that without any recurrence of depression or other psychiatric symptoms. Because she has been asymptomatic she has been seen in approximately 64-month intervals for psychiatric medication management and follow-up.  Specifically she has no history of psychotic symptoms, suicidal behavior or any other acting out behaviors.  No history of substance abuse.  Discussed her problem with nausea and weight loss at the end of 2020 into early 2021.  Discussed the visit on March 2021 with the patient at which the psychiatrist suggested that the nausea could potentially be from Trintellix.  That is the most common side effect associated with Trintellix.  The dosage was reduced reduced from 20 mg to 10 mg daily and mirtazapine 30 mg nightly was added because it has been shown to be helpful for nausea and weight gain. She was seen in follow-up in April as noted and had gained 11 pounds and the nausea had resolved.  Her depression remained in remission.  In summary there is no psychiatric reason to be concerned about her capacity to receive and participate in bone marrow transplant procedures.  It is my opinion that if possible she should remain on the lithium to prevent recurrence of depression.  To be clear she is not bipolar and that is not the reason for which lithium was prescribed.  Lynder Parents MD, DFAPA

## 2019-11-15 ENCOUNTER — Other Ambulatory Visit: Payer: Self-pay | Admitting: Psychiatry

## 2019-11-15 DIAGNOSIS — F5105 Insomnia due to other mental disorder: Secondary | ICD-10-CM

## 2019-11-15 DIAGNOSIS — R634 Abnormal weight loss: Secondary | ICD-10-CM

## 2019-11-15 DIAGNOSIS — F331 Major depressive disorder, recurrent, moderate: Secondary | ICD-10-CM

## 2019-11-17 ENCOUNTER — Telehealth: Payer: Self-pay | Admitting: Nurse Practitioner

## 2019-11-17 ENCOUNTER — Inpatient Hospital Stay: Payer: BC Managed Care – PPO

## 2019-11-17 ENCOUNTER — Other Ambulatory Visit: Payer: Self-pay

## 2019-11-17 ENCOUNTER — Telehealth: Payer: Self-pay | Admitting: *Deleted

## 2019-11-17 ENCOUNTER — Other Ambulatory Visit: Payer: Self-pay | Admitting: *Deleted

## 2019-11-17 DIAGNOSIS — D649 Anemia, unspecified: Secondary | ICD-10-CM

## 2019-11-17 DIAGNOSIS — C9110 Chronic lymphocytic leukemia of B-cell type not having achieved remission: Secondary | ICD-10-CM

## 2019-11-17 DIAGNOSIS — Z79899 Other long term (current) drug therapy: Secondary | ICD-10-CM | POA: Diagnosis not present

## 2019-11-17 DIAGNOSIS — D61818 Other pancytopenia: Secondary | ICD-10-CM | POA: Diagnosis not present

## 2019-11-17 DIAGNOSIS — I951 Orthostatic hypotension: Secondary | ICD-10-CM | POA: Diagnosis not present

## 2019-11-17 DIAGNOSIS — E039 Hypothyroidism, unspecified: Secondary | ICD-10-CM | POA: Diagnosis not present

## 2019-11-17 DIAGNOSIS — E875 Hyperkalemia: Secondary | ICD-10-CM | POA: Diagnosis not present

## 2019-11-17 LAB — CMP (CANCER CENTER ONLY)
ALT: 65 U/L — ABNORMAL HIGH (ref 0–44)
AST: 32 U/L (ref 15–41)
Albumin: 4 g/dL (ref 3.5–5.0)
Alkaline Phosphatase: 116 U/L (ref 38–126)
Anion gap: 13 (ref 5–15)
BUN: 25 mg/dL — ABNORMAL HIGH (ref 6–20)
CO2: 22 mmol/L (ref 22–32)
Calcium: 9.3 mg/dL (ref 8.9–10.3)
Chloride: 109 mmol/L (ref 98–111)
Creatinine: 0.95 mg/dL (ref 0.44–1.00)
GFR, Est AFR Am: >60 mL/min (ref >60)
GFR, Estimated: >60 mL/min (ref >60)
Glucose, Bld: 190 mg/dL — ABNORMAL HIGH (ref 70–99)
Potassium: 4.2 mmol/L (ref 3.5–5.1)
Sodium: 144 mmol/L (ref 135–145)
Total Bilirubin: 0.4 mg/dL (ref 0.3–1.2)
Total Protein: 5.9 g/dL — ABNORMAL LOW (ref 6.5–8.1)

## 2019-11-17 LAB — CBC WITH DIFFERENTIAL (CANCER CENTER ONLY)
Abs Immature Granulocytes: 0.01 10*3/uL (ref 0.00–0.07)
Basophils Absolute: 0 10*3/uL (ref 0.0–0.1)
Basophils Relative: 0 %
Eosinophils Absolute: 0 10*3/uL (ref 0.0–0.5)
Eosinophils Relative: 1 %
HCT: 21.2 % — ABNORMAL LOW (ref 36.0–46.0)
Hemoglobin: 7.2 g/dL — ABNORMAL LOW (ref 12.0–15.0)
Immature Granulocytes: 0 %
Lymphocytes Relative: 67 %
Lymphs Abs: 2 10*3/uL (ref 0.7–4.0)
MCH: 30.3 pg (ref 26.0–34.0)
MCHC: 34 g/dL (ref 30.0–36.0)
MCV: 89.1 fL (ref 80.0–100.0)
Monocytes Absolute: 0.1 10*3/uL (ref 0.1–1.0)
Monocytes Relative: 4 %
Neutro Abs: 0.8 10*3/uL — ABNORMAL LOW (ref 1.7–7.7)
Neutrophils Relative %: 28 %
Platelet Count: 13 10*3/uL — ABNORMAL LOW (ref 150–400)
RBC: 2.38 MIL/uL — ABNORMAL LOW (ref 3.87–5.11)
RDW: 12.9 % (ref 11.5–15.5)
WBC Count: 3 10*3/uL — ABNORMAL LOW (ref 4.0–10.5)
nRBC: 0 % (ref 0.0–0.2)

## 2019-11-17 LAB — MAGNESIUM: Magnesium: 1.9 mg/dL (ref 1.7–2.4)

## 2019-11-17 LAB — SAMPLE TO BLOOD BANK

## 2019-11-17 NOTE — Telephone Encounter (Signed)
Scheduled appt per 4/26 sch message - pt is aware of new appt date and time

## 2019-11-17 NOTE — Telephone Encounter (Signed)
Is she suppose to be on this?  

## 2019-11-17 NOTE — Telephone Encounter (Signed)
Reviewed CBC results w/patient after noted by Dr. Benay Spice. [facility] asking to try to space out transfusions more, but she reports she is feeling weak w/some shortness of breath at the hgb 7.2 today. Per Dr. Benay Spice: Transfuse 1 unit tomorrow. Patient agrees. High priority scheduling message sent.

## 2019-11-18 ENCOUNTER — Inpatient Hospital Stay: Payer: BC Managed Care – PPO

## 2019-11-18 ENCOUNTER — Encounter: Payer: Self-pay | Admitting: Nurse Practitioner

## 2019-11-18 ENCOUNTER — Inpatient Hospital Stay (HOSPITAL_BASED_OUTPATIENT_CLINIC_OR_DEPARTMENT_OTHER): Payer: BC Managed Care – PPO | Admitting: Nurse Practitioner

## 2019-11-18 ENCOUNTER — Inpatient Hospital Stay: Payer: BC Managed Care – PPO | Admitting: Nurse Practitioner

## 2019-11-18 VITALS — BP 105/75 | HR 92 | Temp 98.7°F | Resp 20 | Ht 66.0 in | Wt 124.5 lb

## 2019-11-18 DIAGNOSIS — Z79899 Other long term (current) drug therapy: Secondary | ICD-10-CM | POA: Diagnosis not present

## 2019-11-18 DIAGNOSIS — E875 Hyperkalemia: Secondary | ICD-10-CM | POA: Diagnosis not present

## 2019-11-18 DIAGNOSIS — C9110 Chronic lymphocytic leukemia of B-cell type not having achieved remission: Secondary | ICD-10-CM

## 2019-11-18 DIAGNOSIS — I951 Orthostatic hypotension: Secondary | ICD-10-CM | POA: Diagnosis not present

## 2019-11-18 DIAGNOSIS — E039 Hypothyroidism, unspecified: Secondary | ICD-10-CM | POA: Diagnosis not present

## 2019-11-18 DIAGNOSIS — D61818 Other pancytopenia: Secondary | ICD-10-CM | POA: Diagnosis not present

## 2019-11-18 DIAGNOSIS — D649 Anemia, unspecified: Secondary | ICD-10-CM

## 2019-11-18 LAB — PREPARE RBC (CROSSMATCH)

## 2019-11-18 NOTE — Progress Notes (Addendum)
[place] OFFICE PROGRESS NOTE   Diagnosis: CLL, aplastic anemia  INTERVAL HISTORY:   Meagan Walsh returns as scheduled.  She is scheduled to receive a unit of blood later today.  She has decided against a blood transfusion today.  She would like to wait a few more days.  She has dyspnea on exertion.  No chest pain.  No bleeding except mild vaginal spotting.  Objective:  Vital signs in last 24 hours:  Blood pressure 105/75, pulse 92, temperature 98.7 F (37.1 C), temperature source Temporal, resp. rate 20, height _0  (1.676 m), weight 124 lb 8 oz (56.5 kg), SpO2 100 %.    Resp: Lungs clear bilaterally. Cardio: Regular rate and rhythm. GI: Abdomen soft and nontender.  No hepatomegaly. Vascular: No leg edema. Neuro: Alert and oriented. Skin: Pale appearing. Left upper extremity PICC without erythema.   Lab Results:  Lab Results  Component Value Date   WBC 3.0 (L) 11/17/2019   HGB 7.2 (L) 11/17/2019   HCT 21.2 (L) 11/17/2019   MCV 89.1 11/17/2019   PLT 13 (L) 11/17/2019   NEUTROABS 0.8 (L) 11/17/2019    Imaging:  No results found.  Medications: I have reviewed the patient's current medications.  Assessment/Plan: 1. 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 Mild splenomegaly.  Ultrasound-guided biopsy of the left inguinal lymph node 09/18/2017, slightly "purulent "fluid aspirated, core biopsy is consistent with an atypical lymphoid proliferation-extensive necrosis with surrounding epithelioid histiocytes, limited intact lymphoid tissue involved with CLL  Incisional biopsy of a necrotic/purulent left inguinal lymph node on 10/01/2017-extensive necrosis with  granulomatous inflammation, small amount of viable lymphoid tissue involved with CLL, AFB and fungal stains negative  Peripheral blood FISH analysis 02/05/2018-deletion 13q14, no evidence of p53 (17p13) deletion, no evidence of 11q22deletion  Bone marrow biopsy 02/26/2018-hypercellular marrow with extensive involvement by CLL, lymphocytes represent85% of all cells  Ibrutinib initiated 04/03/2018  Ibrutinib placed on hold 04/11/2018 due to onset of arthralgias  Ibrutinib resumed 04/16/2018, discontinued 04/25/2018 secondary to severe arthralgias/arthritis  Ibrutinib resumed at a dose of 140 mg daily 05/03/2018  Ibrutinib dose adjusted to 140 mg alternating with 269m 06/25/2018  Ibrutinib discontinued 07/03/2018 secondary to severe arthralgias  Acalabrutinib 08/16/2018, discontinued 11/15/2018 secondary to persistent severe transfusion dependent anemia and neutropenia/thrombocytopenia, last dose 11/14/2018  Bone marrow biopsy 11/21/2018-decreased cellularity, involvement by CLL, decreased erythroid and granulocytic precursors, decreased megakaryocytes  Cycle 1 rituximab 12/06/2018  Bone marrow biopsy 12/24/2018 at [facility]-hypocellular bone marrow (10%) involved by CLL, representing 50% of marrow cellularity; markedly decreased trilineage hematopoiesis including essentially absent erythropoiesis  Bone marrow biopsy 03/06/2027 [facility]-hypocellular bone marrow (20%) with marked reduction of maturing hematopoietic elements; numerous lymphoid aggregates consistent with involvement by CLL, representing approximately 50% of marrow cellularity by flow cytometry  [facility] by prednisone taper  Eltrombopag beginning 07/05/2019  Cyclosporine discontinued 07/08/2019  Tacrolimus 07/10/2019, discontinued 09/24/2019  [facility] Sc[facility] bone marrow sampling (<5% cellular) essentially devoid of hematopoietic elements; Persistent chronic lymphocytic  leukemia, representing 23% of marrow cellularity by flow cytometric analysis; Routine cytogenetic analysis is pending.  CT CAP 10/10/2019 showed no acute process or evidence of active lymphoma/leukemia within the chest, abdomen, or pelvis. Resection or resolution of previously described necrotic left inguinal node  Tacrolimus  resumed 10/17/2019, discontinued on 10/28/2019 and resumed at a dose of 0.5 mg twice daily on 10/29/2019  2.Hypothyroidism 3.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 Augmentin prescribed. 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 Pancytopenia secondary to CLL and a hypocellular bone marrow  G-CSF and Nplate started 1/63/8453, G-CSF changed to daily beginning 12/17/2018; G-CSF discontinued 12/31/2018, last Nplate 01/15/2019  Began prednisone 60 mg daily 01/11/19, tolerating moderately well except jitteriness, irritability, and difficulty sleeping. Tapered to 40 mg daily x1 week starting 01/24/19, reduce by 10 mg each week until discontinued. Prednisone discontinued 02/20/2019.  Promacta started 01/20/2019, dose increased to 100 mg daily 02/04/2019; dose increased to 150 mg daily 02/21/2019  IVIG daily for 2 days beginning 02/25/2019  9. 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 No lymphadenopathy or splenomegaly. Exjade  beginning 06/28/2019. 13. Elevated creatinine-cyclosporine toxicity?  Elevated 10/28/2019 with hyperkalemia-tacrolimus induced? 14.Vaginal bleeding beginning 07/13/2019-Mirena device removed, uterine fibroids and possible endometrial fluid or thickening in the fundus per CT CAP on 10/10/19 15.Elevated lithium level 08/08/2019, lithium toxicity?-Lithium held 08/08/2019-08/12/2019, then resumed at a lower dose 16. Deconditioning, secondary to n/v/d 17. Orthostatic hypotension 09/15/19,received500 cc NS in clinic  Disposition: Ms. Bischoff appears unchanged.  We reviewed the CBC from yesterday.  She has persistent severe pancytopenia.  She has decided against a blood transfusion today.  She would like to have repeat labs and a possible blood transfusion on 11/20/2019.  She continues close follow-up with Dr. Oretha Ellis at [facility] regarding whether to proceed with transplant.  She will return for lab, follow-up, possible blood transfusion on 11/20/2019.  Patient seen with Dr. Benay Spice.  Ned Card ANP/GNP-BC   11/18/2019  1:17 PM This was a shared visit with Ned Card.  Ms. Tweed appears unchanged.  She has persistent pancytopenia, though the neutrophil count has improved over the past several weeks.  The platelet count has stabilized, but the hemoglobin remains low and she is symptomatic from anemia.  I discussed the case with Dr. Oretha Ellis yesterday.  We discussed the indication for continuing the current therapy with Promacta/tacrolimus and supportive care versus allogeneic transplant.  Dr. Oretha Ellis feels Ms. Cost is at high risk for transplant related morbidity/mortality given her age and comorbid conditions.  Ms. Steinhart discussed options with Dr. Oretha Ellis yesterday.  Her current feeling is that she would like to proceed with transplant as opposed to continuing supportive therapy with immunosuppression and bone marrow stimulating agents.  We also discussed the possibility of a referral for another  opinion.  The plan is to check a CBC later this week and administer a red cell transfusion if the hemoglobin is lower.  I will inquire as to when Dr. Prince Solian is returning as she is very familiar with Ms. Vavra history.  Ms. Luster will be scheduled for an office visit and further discussion on 11/20/2019.

## 2019-11-20 ENCOUNTER — Encounter
Admit: 2019-11-20 | Discharge: 2019-11-21 | Payer: PRIVATE HEALTH INSURANCE | Attending: Student in an Organized Health Care Education/Training Program | Primary: Student in an Organized Health Care Education/Training Program

## 2019-11-20 ENCOUNTER — Inpatient Hospital Stay (HOSPITAL_BASED_OUTPATIENT_CLINIC_OR_DEPARTMENT_OTHER): Payer: BC Managed Care – PPO | Admitting: Nurse Practitioner

## 2019-11-20 ENCOUNTER — Inpatient Hospital Stay: Payer: BC Managed Care – PPO

## 2019-11-20 ENCOUNTER — Other Ambulatory Visit: Payer: Self-pay | Admitting: *Deleted

## 2019-11-20 ENCOUNTER — Inpatient Hospital Stay: Payer: BC Managed Care – PPO | Admitting: *Deleted

## 2019-11-20 ENCOUNTER — Other Ambulatory Visit: Payer: Self-pay

## 2019-11-20 VITALS — BP 110/65 | HR 82 | Temp 99.1°F | Resp 18 | Wt 123.8 lb

## 2019-11-20 DIAGNOSIS — C9110 Chronic lymphocytic leukemia of B-cell type not having achieved remission: Secondary | ICD-10-CM

## 2019-11-20 DIAGNOSIS — E039 Hypothyroidism, unspecified: Secondary | ICD-10-CM | POA: Diagnosis not present

## 2019-11-20 DIAGNOSIS — D61818 Other pancytopenia: Secondary | ICD-10-CM | POA: Diagnosis not present

## 2019-11-20 DIAGNOSIS — Z95828 Presence of other vascular implants and grafts: Secondary | ICD-10-CM

## 2019-11-20 DIAGNOSIS — D649 Anemia, unspecified: Secondary | ICD-10-CM

## 2019-11-20 DIAGNOSIS — F325 Major depressive disorder, single episode, in full remission: Secondary | ICD-10-CM | POA: Diagnosis not present

## 2019-11-20 DIAGNOSIS — Z79899 Other long term (current) drug therapy: Secondary | ICD-10-CM | POA: Diagnosis not present

## 2019-11-20 DIAGNOSIS — E875 Hyperkalemia: Secondary | ICD-10-CM | POA: Diagnosis not present

## 2019-11-20 DIAGNOSIS — D696 Thrombocytopenia, unspecified: Secondary | ICD-10-CM

## 2019-11-20 DIAGNOSIS — F419 Anxiety disorder, unspecified: Secondary | ICD-10-CM | POA: Diagnosis not present

## 2019-11-20 DIAGNOSIS — I951 Orthostatic hypotension: Secondary | ICD-10-CM | POA: Diagnosis not present

## 2019-11-20 DIAGNOSIS — Z452 Encounter for adjustment and management of vascular access device: Secondary | ICD-10-CM

## 2019-11-20 LAB — TYPE AND SCREEN
ABO/RH(D): O POS
Antibody Screen: NEGATIVE
Unit division: 0

## 2019-11-20 LAB — CMP (CANCER CENTER ONLY)
ALT: 58 U/L — ABNORMAL HIGH (ref 0–44)
AST: 24 U/L (ref 15–41)
Albumin: 4 g/dL (ref 3.5–5.0)
Alkaline Phosphatase: 120 U/L (ref 38–126)
Anion gap: 7 (ref 5–15)
BUN: 28 mg/dL — ABNORMAL HIGH (ref 6–20)
CO2: 24 mmol/L (ref 22–32)
Calcium: 9.4 mg/dL (ref 8.9–10.3)
Chloride: 109 mmol/L (ref 98–111)
Creatinine: 1.01 mg/dL — ABNORMAL HIGH (ref 0.44–1.00)
GFR, Est AFR Am: >60 mL/min (ref >60)
GFR, Estimated: >60 mL/min (ref >60)
Glucose, Bld: 106 mg/dL — ABNORMAL HIGH (ref 70–99)
Potassium: 4.6 mmol/L (ref 3.5–5.1)
Sodium: 140 mmol/L (ref 135–145)
Total Bilirubin: 0.4 mg/dL (ref 0.3–1.2)
Total Protein: 6.1 g/dL — ABNORMAL LOW (ref 6.5–8.1)

## 2019-11-20 LAB — CBC WITH DIFFERENTIAL (CANCER CENTER ONLY)
Abs Immature Granulocytes: 0 10*3/uL (ref 0.00–0.07)
Basophils Absolute: 0 10*3/uL (ref 0.0–0.1)
Basophils Relative: 0 %
Eosinophils Absolute: 0 10*3/uL (ref 0.0–0.5)
Eosinophils Relative: 0 %
HCT: 19.9 % — ABNORMAL LOW (ref 36.0–46.0)
Hemoglobin: 6.7 g/dL — CL (ref 12.0–15.0)
Immature Granulocytes: 0 %
Lymphocytes Relative: 63 %
Lymphs Abs: 2.3 10*3/uL (ref 0.7–4.0)
MCH: 29.6 pg (ref 26.0–34.0)
MCHC: 33.7 g/dL (ref 30.0–36.0)
MCV: 88.1 fL (ref 80.0–100.0)
Monocytes Absolute: 0.2 10*3/uL (ref 0.1–1.0)
Monocytes Relative: 5 %
Neutro Abs: 1.2 10*3/uL — ABNORMAL LOW (ref 1.7–7.7)
Neutrophils Relative %: 32 %
Platelet Count: 17 10*3/uL — ABNORMAL LOW (ref 150–400)
RBC: 2.26 MIL/uL — ABNORMAL LOW (ref 3.87–5.11)
RDW: 12.7 % (ref 11.5–15.5)
WBC Count: 3.7 10*3/uL — ABNORMAL LOW (ref 4.0–10.5)
nRBC: 0 % (ref 0.0–0.2)

## 2019-11-20 LAB — LITHIUM LEVEL: Lithium Lvl: 0.83 mmol/L (ref 0.60–1.20)

## 2019-11-20 LAB — SAMPLE TO BLOOD BANK

## 2019-11-20 LAB — PREPARE RBC (CROSSMATCH)

## 2019-11-20 LAB — BPAM RBC
Blood Product Expiration Date: 202105242359
Unit Type and Rh: 5100

## 2019-11-20 MED ORDER — SODIUM CHLORIDE 0.9% FLUSH
10.0000 mL | INTRAVENOUS | Status: DC | PRN
  Administered 2019-11-20: 10 mL
  Filled 2019-11-20: qty 10

## 2019-11-20 MED ORDER — SODIUM CHLORIDE 0.9% FLUSH
10.0000 mL | INTRAVENOUS | Status: DC | PRN
  Filled 2019-11-20: qty 10

## 2019-11-20 MED ORDER — HEPARIN SOD (PORK) LOCK FLUSH 100 UNIT/ML IV SOLN
250.0000 [IU] | INTRAVENOUS | Status: AC | PRN
  Administered 2019-11-20: 17:00:00 250 [IU]
  Filled 2019-11-20: qty 5

## 2019-11-20 MED ORDER — SODIUM CHLORIDE 0.9% FLUSH
3.0000 mL | INTRAVENOUS | Status: AC | PRN
  Administered 2019-11-20: 3 mL
  Filled 2019-11-20: qty 10

## 2019-11-20 MED ORDER — SODIUM CHLORIDE 0.9% IV SOLUTION
250.0000 mL | Freq: Once | INTRAVENOUS | Status: DC
  Filled 2019-11-20: qty 250

## 2019-11-20 NOTE — Patient Instructions (Signed)

## 2019-11-20 NOTE — Progress Notes (Addendum)
[name]   Diagnosis: CLL, aplastic anemia  INTERVAL HISTORY:   Meagan Walsh returns as scheduled.  She is more short of breath with exertion.  Energy level is poor.  No chest pain.  No bleeding.  She reports a good appetite.  Objective:  Vital signs in last 24 hours:  Blood pressure 110/65, pulse 82, temperature 99.1 F (37.3 C), temperature source Temporal, resp. rate 18, weight 123 lb 12.8 oz (56.2 kg), SpO2 100 %.    HEENT: No thrush or ulcers.  Gingival hypertrophy is better. GI: Abdomen soft and nontender.  No hepatosplenomegaly. Vascular: No leg edema. Neuro: Alert and oriented. Skin: Pale appearing. Left upper extremity PICC without erythema.   Lab Results:  Lab Results  Component Value Date   WBC 3.7 (L) 11/20/2019   HGB 6.7 (LL) 11/20/2019   HCT 19.9 (L) 11/20/2019   MCV 88.1 11/20/2019   PLT 17 (L) 11/20/2019   NEUTROABS 1.2 (L) 11/20/2019    Imaging:  No results found.  Medications: I have reviewed the patient's current medications.  Assessment/Plan: 1. 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 Mild splenomegaly.  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, no evidence of 11q22deletion  Bone marrow biopsy 02/26/2018-hypercellular marrow with extensive involvement by CLL, lymphocytes represent85% of all cells  Ibrutinib initiated 04/03/2018  Ibrutinib placed on hold 04/11/2018 due to onset of arthralgias  Ibrutinib resumed 04/16/2018, discontinued 04/25/2018 secondary to severe arthralgias/arthritis  Ibrutinib resumed at a dose of 140 mg daily 05/03/2018  Ibrutinib dose adjusted to 140 mg alternating with 213m 06/25/2018  Ibrutinib discontinued 07/03/2018 secondary to severe arthralgias  Acalabrutinib 08/16/2018, discontinued 11/15/2018 secondary to persistent severe transfusion dependent anemia and neutropenia/thrombocytopenia, last dose 11/14/2018  Bone marrow biopsy 11/21/2018-decreased cellularity, involvement by CLL, decreased erythroid and granulocytic precursors, decreased megakaryocytes  Cycle 1 rituximab 12/06/2018  Bone marrow biopsy 12/24/2018 at [facility]-hypocellular bone marrow (10%) involved by CLL, representing 50% of marrow cellularity; markedly decreased trilineage hematopoiesis including essentially absent erythropoiesis  Bone marrow biopsy 03/06/2027 [facility]-hypocellular bone marrow (20%) with marked reduction of maturing hematopoietic elements; numerous lymphoid aggregates consistent with involvement by CLL, representing approximately 50% of marrow cellularity by flow cytometry  [facility] by prednisone taper  Eltrombopag beginning 07/05/2019  Cyclosporine discontinued 07/08/2019  Tacrolimus 07/10/2019, discontinued 09/24/2019  [facility] bone marrow sampling (<5% cellular) essentially devoid of hematopoietic elements; Persistent chronic lymphocytic leukemia, representing 23% of marrow cellularity by flow cytometric analysis; Routine cytogenetic analysis is pending.  CT CAP 10/10/2019  showed no acute process or evidence of active lymphoma/leukemia within the chest, abdomen, or pelvis. Resection or resolution of previously described necrotic left inguinal node  Tacrolimus resumed 10/17/2019, discontinued on 10/28/2019 and resumed at a dose of 0.5 mg twice daily on 10/29/2019  2.Hypothyroidism 3.Hepatitis B surface and core antibody  positive  Hepatitis B surface antigen negative and hepatitis B core antibody -12/02/2018 4.Left lung pneumonia diagnosed 10/08/2017-completed 7 days of Levaquin 5.Left lung pneumoniaon chest x-ray 12/27/2017. Augmentin prescribed. 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 Pancytopenia secondary to CLL and a hypocellular bone marrow  G-CSF and Nplate started 6/43/3295, G-CSF changed to daily beginning 12/17/2018; G-CSF discontinued 12/31/2018, last Nplate 01/15/2019  Began prednisone 60 mg daily 01/11/19, tolerating moderately well except jitteriness, irritability, and difficulty sleeping. Tapered to 40 mg daily x1 week starting 01/24/19, reduce by 10 mg each week until discontinued. Prednisone discontinued 02/20/2019.  Promacta started 01/20/2019, dose increased to 100 mg daily 02/04/2019; dose increased to 150 mg daily 02/21/2019  IVIG daily for 2 days beginning 02/25/2019  9. 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 No lymphadenopathy or splenomegaly. Exjade beginning 06/28/2019. 13. Elevated creatinine-cyclosporine toxicity?Elevated 10/28/2019 with hyperkalemia-tacrolimus induced? 14.Vaginal  bleeding beginning 07/13/2019-Mirena device removed, uterine fibroids and possible endometrial fluid or thickening in the fundus per CT CAP on 10/10/19 15.Elevated lithium level 08/08/2019, lithium toxicity?-Lithium held 08/08/2019-08/12/2019, then resumed at a lower dose 16. Deconditioning, secondary to n/v/d 17. Orthostatic hypotension 09/15/19,received500 cc NS in clinic   Disposition: Meagan Walsh appears unchanged.  We reviewed the CBC from today.  She has progressive anemia and is symptomatic.  She will receive 2 units of blood today.  The absolute neutrophil count and platelet count have improved.  Plan to continue to monitor blood counts closely.  She will return for PICC care twice weekly, plan repeat labs in 1 week.  We will see her in follow-up in 2 weeks.  She will contact the office in the interim with any problems.  Patient seen with Dr. Benay Spice.    Ned Card ANP/GNP-BC   11/20/2019  11:30 AM  This was a shared visit with Ned Card.  Meagan Walsh has symptomatic anemia and will be transfused with packed red blood cells.  The neutrophils and platelets have improved.  She continues tacrolimus and eltrombopag.  We will follow up on the tacrolimus level from today.  We will communicate with the transplant team at [facility] regarding the need to continue antibiotic prophylaxis.  Meagan Walsh continues to consider the option of allogeneic transplant versus continuing immunosuppression/supportive care.  I will contact Dr. Prince Solian to discuss the case when she returns next week.  [name], MD

## 2019-11-20 NOTE — Addendum Note (Signed)
Addended by: Will Bonnet on: 11/20/2019 10:59 AM   Modules accepted: Orders, SmartSet

## 2019-11-21 ENCOUNTER — Other Ambulatory Visit: Payer: Self-pay | Admitting: *Deleted

## 2019-11-21 ENCOUNTER — Telehealth: Payer: Self-pay | Admitting: Oncology

## 2019-11-21 DIAGNOSIS — C9110 Chronic lymphocytic leukemia of B-cell type not having achieved remission: Secondary | ICD-10-CM

## 2019-11-21 NOTE — Telephone Encounter (Signed)
Scheduled per los. Called and spoke with patient. Confirmed appt 

## 2019-11-22 LAB — BPAM RBC
Blood Product Expiration Date: 202105242359
Blood Product Expiration Date: 202105272359
ISSUE DATE / TIME: 202104291259
ISSUE DATE / TIME: 202104291259
Unit Type and Rh: 5100
Unit Type and Rh: 5100

## 2019-11-22 LAB — TYPE AND SCREEN
ABO/RH(D): O POS
Antibody Screen: NEGATIVE
Unit division: 0
Unit division: 0

## 2019-11-22 LAB — TACROLIMUS LEVEL: Tacrolimus (FK506) - LabCorp: 4.9 ng/mL (ref 2.0–20.0)

## 2019-11-24 ENCOUNTER — Inpatient Hospital Stay: Payer: BC Managed Care – PPO | Attending: Oncology

## 2019-11-24 ENCOUNTER — Other Ambulatory Visit: Payer: Self-pay

## 2019-11-24 DIAGNOSIS — C9110 Chronic lymphocytic leukemia of B-cell type not having achieved remission: Secondary | ICD-10-CM | POA: Diagnosis not present

## 2019-11-24 DIAGNOSIS — E875 Hyperkalemia: Secondary | ICD-10-CM | POA: Diagnosis not present

## 2019-11-24 DIAGNOSIS — Z452 Encounter for adjustment and management of vascular access device: Secondary | ICD-10-CM

## 2019-11-24 DIAGNOSIS — Z95828 Presence of other vascular implants and grafts: Secondary | ICD-10-CM

## 2019-11-24 DIAGNOSIS — D61818 Other pancytopenia: Secondary | ICD-10-CM | POA: Diagnosis not present

## 2019-11-24 DIAGNOSIS — E039 Hypothyroidism, unspecified: Secondary | ICD-10-CM | POA: Diagnosis not present

## 2019-11-24 DIAGNOSIS — D801 Nonfamilial hypogammaglobulinemia: Secondary | ICD-10-CM | POA: Diagnosis not present

## 2019-11-24 DIAGNOSIS — D696 Thrombocytopenia, unspecified: Secondary | ICD-10-CM

## 2019-11-24 MED ORDER — HEPARIN SOD (PORK) LOCK FLUSH 100 UNIT/ML IV SOLN
500.0000 [IU] | Freq: Once | INTRAVENOUS | Status: AC | PRN
  Administered 2019-11-24: 500 [IU]
  Filled 2019-11-24: qty 5

## 2019-11-24 MED ORDER — SODIUM CHLORIDE 0.9% FLUSH
10.0000 mL | INTRAVENOUS | Status: DC | PRN
  Administered 2019-11-24: 10 mL
  Filled 2019-11-24: qty 10

## 2019-11-24 MED ORDER — TACROLIMUS 0.5 MG CAPSULE, IMMEDIATE-RELEASE
ORAL_CAPSULE | Freq: Two times a day (BID) | ORAL | 1 refills | 30.00000 days | Status: CP
Start: 2019-11-24 — End: ?

## 2019-11-24 NOTE — Progress Notes (Signed)
Tacrolimus level of 4.9 was emailed to Dewaine Conger, PharmD w/transplant team. Confirmed she held dose morning of draw.

## 2019-11-25 ENCOUNTER — Encounter
Admit: 2019-11-25 | Discharge: 2019-11-26 | Payer: PRIVATE HEALTH INSURANCE | Attending: Hematology & Oncology | Primary: Hematology & Oncology

## 2019-11-25 ENCOUNTER — Other Ambulatory Visit: Payer: Self-pay | Admitting: Psychiatry

## 2019-11-25 DIAGNOSIS — D619 Aplastic anemia, unspecified: Secondary | ICD-10-CM | POA: Diagnosis not present

## 2019-11-25 DIAGNOSIS — C9112 Chronic lymphocytic leukemia of B-cell type in relapse: Secondary | ICD-10-CM | POA: Diagnosis not present

## 2019-11-27 ENCOUNTER — Other Ambulatory Visit: Payer: Self-pay | Admitting: *Deleted

## 2019-11-27 ENCOUNTER — Other Ambulatory Visit: Payer: Self-pay

## 2019-11-27 ENCOUNTER — Other Ambulatory Visit: Payer: BC Managed Care – PPO

## 2019-11-27 ENCOUNTER — Inpatient Hospital Stay: Payer: BC Managed Care – PPO

## 2019-11-27 DIAGNOSIS — E039 Hypothyroidism, unspecified: Secondary | ICD-10-CM | POA: Diagnosis not present

## 2019-11-27 DIAGNOSIS — Z452 Encounter for adjustment and management of vascular access device: Secondary | ICD-10-CM

## 2019-11-27 DIAGNOSIS — D61818 Other pancytopenia: Secondary | ICD-10-CM | POA: Diagnosis not present

## 2019-11-27 DIAGNOSIS — C9110 Chronic lymphocytic leukemia of B-cell type not having achieved remission: Secondary | ICD-10-CM

## 2019-11-27 DIAGNOSIS — D801 Nonfamilial hypogammaglobulinemia: Secondary | ICD-10-CM | POA: Diagnosis not present

## 2019-11-27 DIAGNOSIS — D696 Thrombocytopenia, unspecified: Secondary | ICD-10-CM

## 2019-11-27 DIAGNOSIS — D649 Anemia, unspecified: Secondary | ICD-10-CM

## 2019-11-27 DIAGNOSIS — Z95828 Presence of other vascular implants and grafts: Secondary | ICD-10-CM

## 2019-11-27 DIAGNOSIS — E875 Hyperkalemia: Secondary | ICD-10-CM | POA: Diagnosis not present

## 2019-11-27 DIAGNOSIS — D619 Aplastic anemia, unspecified: Principal | ICD-10-CM

## 2019-11-27 LAB — CBC WITH DIFFERENTIAL (CANCER CENTER ONLY)
Abs Immature Granulocytes: 0.01 10*3/uL (ref 0.00–0.07)
Basophils Absolute: 0 10*3/uL (ref 0.0–0.1)
Basophils Relative: 0 %
Eosinophils Absolute: 0 10*3/uL (ref 0.0–0.5)
Eosinophils Relative: 0 %
HCT: 26.4 % — ABNORMAL LOW (ref 36.0–46.0)
Hemoglobin: 9 g/dL — ABNORMAL LOW (ref 12.0–15.0)
Immature Granulocytes: 0 %
Lymphocytes Relative: 69 %
Lymphs Abs: 2.8 10*3/uL (ref 0.7–4.0)
MCH: 28.5 pg (ref 26.0–34.0)
MCHC: 34.1 g/dL (ref 30.0–36.0)
MCV: 83.5 fL (ref 80.0–100.0)
Monocytes Absolute: 0.2 10*3/uL (ref 0.1–1.0)
Monocytes Relative: 4 %
Neutro Abs: 1.1 10*3/uL — ABNORMAL LOW (ref 1.7–7.7)
Neutrophils Relative %: 27 %
Platelet Count: 22 10*3/uL — ABNORMAL LOW (ref 150–400)
RBC: 3.16 MIL/uL — ABNORMAL LOW (ref 3.87–5.11)
RDW: 13.5 % (ref 11.5–15.5)
WBC Count: 4.1 10*3/uL (ref 4.0–10.5)
nRBC: 0 % (ref 0.0–0.2)

## 2019-11-27 LAB — SAMPLE TO BLOOD BANK

## 2019-11-27 LAB — CMP (CANCER CENTER ONLY)
ALT: 50 U/L — ABNORMAL HIGH (ref 0–44)
AST: 24 U/L (ref 15–41)
Albumin: 4 g/dL (ref 3.5–5.0)
Alkaline Phosphatase: 128 U/L — ABNORMAL HIGH (ref 38–126)
Anion gap: 6 (ref 5–15)
BUN: 31 mg/dL — ABNORMAL HIGH (ref 6–20)
CO2: 24 mmol/L (ref 22–32)
Calcium: 9.5 mg/dL (ref 8.9–10.3)
Chloride: 111 mmol/L (ref 98–111)
Creatinine: 0.92 mg/dL (ref 0.44–1.00)
GFR, Est AFR Am: >60 mL/min (ref >60)
GFR, Estimated: >60 mL/min (ref >60)
Glucose, Bld: 120 mg/dL — ABNORMAL HIGH (ref 70–99)
Potassium: 4.5 mmol/L (ref 3.5–5.1)
Sodium: 141 mmol/L (ref 135–145)
Total Bilirubin: 0.6 mg/dL (ref 0.3–1.2)
Total Protein: 5.9 g/dL — ABNORMAL LOW (ref 6.5–8.1)

## 2019-11-27 LAB — RETIC PANEL
Immature Retic Fract: 10.5 % (ref 2.3–15.9)
RBC.: 3.16 MIL/uL — ABNORMAL LOW (ref 3.87–5.11)
Retic Count, Absolute: 6.6 10*3/uL — ABNORMAL LOW (ref 19.0–186.0)
Retic Ct Pct: <0.4 % — ABNORMAL LOW (ref 0.4–3.1)
Reticulocyte Hemoglobin: 36.4 pg (ref >27.9)

## 2019-11-27 LAB — LITHIUM LEVEL: Lithium Lvl: 0.65 mmol/L (ref 0.60–1.20)

## 2019-11-27 MED ORDER — SODIUM CHLORIDE 0.9% FLUSH
10.0000 mL | INTRAVENOUS | Status: DC | PRN
  Administered 2019-11-27: 10 mL
  Filled 2019-11-27: qty 10

## 2019-11-27 MED ORDER — HEPARIN SOD (PORK) LOCK FLUSH 100 UNIT/ML IV SOLN
500.0000 [IU] | Freq: Once | INTRAVENOUS | Status: AC | PRN
  Administered 2019-11-27: 250 [IU]
  Filled 2019-11-27: qty 5

## 2019-11-27 NOTE — Patient Instructions (Signed)
PICC Home Care Guide  A peripherally inserted central catheter (PICC) is a form of IV access that allows medicines and IV fluids to be quickly distributed throughout the body. The PICC is a long, thin, flexible tube (catheter) that is inserted into a vein in the upper arm. The catheter ends in a large vein in the chest (superior vena cava, or SVC). After the PICC is inserted, a chest X-ray may be done to make sure that it is in the correct place. A PICC may be placed for different reasons, such as:  To give medicines and liquid nutrition.  To give IV fluids and blood products.  If there is trouble placing a peripheral intravenous (PIV) catheter. If taken care of properly, a PICC can remain in place for several months. Having a PICC can also allow a person to go home from the hospital sooner. Medicine and PICC care can be managed at home by a family member, caregiver, or home health care team. What are the risks? Generally, having a PICC is safe. However, problems may occur, including:  A blood clot (thrombus) forming in or at the tip of the PICC.  A blood clot forming in a vein (deep vein thrombosis) or traveling to the lung (pulmonary embolism).  Inflammation of the vein (phlebitis) in which the PICC is placed.  Infection. Central line associated blood stream infection (CLABSI) is a serious infection that often requires hospitalization.  PICC movement (malposition). The PICC tip may move from its original position due to excessive physical activity, forceful coughing, sneezing, or vomiting.  A break or cut in the PICC. It is important not to use scissors near the PICC.  Nerve or tendon irritation or injury during PICC insertion. How to take care of your PICC Preventing problems  You and any caregivers should wash your hands often with soap. Wash hands: ? Before touching the PICC line or the infusion device. ? Before changing a bandage (dressing).  Flush the PICC as told by your  health care provider. Let your health care provider know right away if the PICC is hard to flush or does not flush. Do not use force to flush the PICC.  Do not use a syringe that is less than 10 mL to flush the PICC.  Avoid blood pressure checks on the arm in which the PICC is placed.  Never pull or tug on the PICC.  Do not take the PICC out yourself. Only a trained clinical professional should remove the PICC.  Use clean and sterile supplies only. Keep the supplies in a dry place. Do not reuse needles, syringes, or any other supplies. Doing that can lead to infection.  Keep pets and children away from your PICC line.  Check the PICC insertion site every day for signs of infection. Check for: ? Leakage. ? Redness, swelling, or pain. ? Fluid or blood. ? Warmth. ? Pus or a bad smell. PICC dressing care  Keep your PICC bandage (dressing) clean and dry to prevent infection.  Do not take baths, swim, or use a hot tub until your health care provider approves. Ask your health care provider if you can take showers. You may only be allowed to take sponge baths for bathing. When you are allowed to shower: ? Ask your health care provider to teach you how to wrap the PICC line. ? Cover the PICC line with clear plastic wrap and tape to keep it dry while showering.  Follow instructions from your health care provider   about how to take care of your insertion site and dressing. Make sure you: ? Wash your hands with soap and water before you change your bandage (dressing). If soap and water are not available, use hand sanitizer. ? Change your dressing as told by your health care provider. ? Leave stitches (sutures), skin glue, or adhesive strips in place. These skin closures may need to stay in place for 2 weeks or longer. If adhesive strip edges start to loosen and curl up, you may trim the loose edges. Do not remove adhesive strips completely unless your health care provider tells you to do  that.  Change your PICC dressing if it becomes loose or wet. General instructions   Carry your PICC identification card or wear a medical alert bracelet at all times.  Keep the tube clamped at all times, unless it is being used.  Carry a smooth-edge clamp with you at all times to place on the tube if it breaks.  Do not use scissors or sharp objects near the tube.  You may bend your arm and move it freely. If your PICC is near or at the bend of your elbow, avoid activity with repeated motion at the elbow.  Avoid lifting heavy objects as told by your health care provider.  Keep all follow-up visits as told by your health care provider. This is important. Disposal of supplies  Throw away any syringes in a disposal container that is meant for sharp items (sharps container). You can buy a sharps container from a pharmacy, or you can make one by using an empty hard plastic bottle with a cover.  Place any used dressings or infusion bags into a plastic bag. Throw that bag in the trash. Contact a health care provider if:  You have pain in your arm, ear, face, or teeth.  You have a fever or chills.  You have redness, swelling, or pain around the insertion site.  You have fluid or blood coming from the insertion site.  Your insertion site feels warm to the touch.  You have pus or a bad smell coming from the insertion site.  Your skin feels hard and raised around the insertion site. Get help right away if:  Your PICC is accidentally pulled all the way out. If this happens, cover the insertion site with a bandage or gauze dressing. Do not throw the PICC away. Your health care provider will need to check it.  Your PICC was tugged or pulled and has partially come out. Do not  push the PICC back in.  You cannot flush the PICC, it is hard to flush, or the PICC leaks around the insertion site when it is flushed.  You hear a "flushing" sound when the PICC is flushed.  You feel your  heart racing or skipping beats.  There is a hole or tear in the PICC.  You have swelling in the arm in which the PICC was inserted.  You have a red streak going up your arm from where the PICC was inserted. Summary  A peripherally inserted central catheter (PICC) is a long, thin, flexible tube (catheter) that is inserted into a vein in the upper arm.  The PICC is inserted using a sterile technique by a specially trained nurse or physician. Only a trained clinical professional should remove it.  Keep your PICC identification card with you at all times.  Avoid blood pressure checks on the arm in which the PICC is placed.  If cared for   properly, a PICC can remain in place for several months. Having a PICC can also allow a person to go home from the hospital sooner. This information is not intended to replace advice given to you by your health care provider. Make sure you discuss any questions you have with your health care provider. Document Revised: 06/22/2017 Document Reviewed: 08/12/2016 Elsevier Patient Education  2020 [employer]  

## 2019-11-27 NOTE — Progress Notes (Signed)
MD reviewed CBC results and patient notified. May d/c cefdinir now. F/U as scheduled. She reports she is onboard for being seen at the [facility]. Requested records were faxed today. Fax [phone] att: Michelene Heady

## 2019-11-28 ENCOUNTER — Other Ambulatory Visit: Payer: Self-pay | Admitting: *Deleted

## 2019-11-28 ENCOUNTER — Other Ambulatory Visit: Payer: Self-pay | Admitting: Oncology

## 2019-11-28 MED ORDER — TACROLIMUS 1 MG PO CAPS: 1.0000 mg | ORAL_CAPSULE | Freq: Two times a day (BID) | ORAL | 1 refills | Status: DC

## 2019-11-28 MED ORDER — TACROLIMUS 1 MG CAPSULE, IMMEDIATE-RELEASE
ORAL_CAPSULE | Freq: Two times a day (BID) | ORAL | 5 refills | 30 days
Start: 2019-11-28 — End: 2020-11-27

## 2019-11-28 MED FILL — TACROLIMUS 1 MG CAPSULE: 1 | 30 days supply | Qty: 60 | Fill #0

## 2019-11-29 LAB — TACROLIMUS LEVEL: Tacrolimus (FK506) - LabCorp: 5 ng/mL (ref 2.0–20.0)

## 2019-12-01 ENCOUNTER — Inpatient Hospital Stay: Payer: BC Managed Care – PPO

## 2019-12-01 ENCOUNTER — Encounter: Payer: Self-pay | Admitting: *Deleted

## 2019-12-01 ENCOUNTER — Other Ambulatory Visit: Payer: Self-pay

## 2019-12-01 DIAGNOSIS — E875 Hyperkalemia: Secondary | ICD-10-CM | POA: Diagnosis not present

## 2019-12-01 DIAGNOSIS — D696 Thrombocytopenia, unspecified: Secondary | ICD-10-CM

## 2019-12-01 DIAGNOSIS — D801 Nonfamilial hypogammaglobulinemia: Secondary | ICD-10-CM | POA: Diagnosis not present

## 2019-12-01 DIAGNOSIS — Z452 Encounter for adjustment and management of vascular access device: Secondary | ICD-10-CM

## 2019-12-01 DIAGNOSIS — C9110 Chronic lymphocytic leukemia of B-cell type not having achieved remission: Secondary | ICD-10-CM | POA: Diagnosis not present

## 2019-12-01 DIAGNOSIS — D61818 Other pancytopenia: Secondary | ICD-10-CM | POA: Diagnosis not present

## 2019-12-01 DIAGNOSIS — Z95828 Presence of other vascular implants and grafts: Secondary | ICD-10-CM

## 2019-12-01 DIAGNOSIS — E039 Hypothyroidism, unspecified: Secondary | ICD-10-CM | POA: Diagnosis not present

## 2019-12-01 MED ORDER — HEPARIN SOD (PORK) LOCK FLUSH 100 UNIT/ML IV SOLN
500.0000 [IU] | Freq: Once | INTRAVENOUS | Status: AC | PRN
  Administered 2019-12-01: 250 [IU]
  Filled 2019-12-01: qty 5

## 2019-12-01 MED ORDER — SODIUM CHLORIDE 0.9% FLUSH
10.0000 mL | INTRAVENOUS | Status: DC | PRN
  Administered 2019-12-01: 10 mL
  Filled 2019-12-01: qty 10

## 2019-12-01 NOTE — Patient Instructions (Signed)
PICC Home Care Guide  A peripherally inserted central catheter (PICC) is a form of IV access that allows medicines and IV fluids to be quickly distributed throughout the body. The PICC is a long, thin, flexible tube (catheter) that is inserted into a vein in the upper arm. The catheter ends in a large vein in the chest (superior vena cava, or SVC). After the PICC is inserted, a chest X-ray may be done to make sure that it is in the correct place. A PICC may be placed for different reasons, such as:  To give medicines and liquid nutrition.  To give IV fluids and blood products.  If there is trouble placing a peripheral intravenous (PIV) catheter. If taken care of properly, a PICC can remain in place for several months. Having a PICC can also allow a person to go home from the hospital sooner. Medicine and PICC care can be managed at home by a family member, caregiver, or home health care team. What are the risks? Generally, having a PICC is safe. However, problems may occur, including:  A blood clot (thrombus) forming in or at the tip of the PICC.  A blood clot forming in a vein (deep vein thrombosis) or traveling to the lung (pulmonary embolism).  Inflammation of the vein (phlebitis) in which the PICC is placed.  Infection. Central line associated blood stream infection (CLABSI) is a serious infection that often requires hospitalization.  PICC movement (malposition). The PICC tip may move from its original position due to excessive physical activity, forceful coughing, sneezing, or vomiting.  A break or cut in the PICC. It is important not to use scissors near the PICC.  Nerve or tendon irritation or injury during PICC insertion. How to take care of your PICC Preventing problems  You and any caregivers should wash your hands often with soap. Wash hands: ? Before touching the PICC line or the infusion device. ? Before changing a bandage (dressing).  Flush the PICC as told by your  health care provider. Let your health care provider know right away if the PICC is hard to flush or does not flush. Do not use force to flush the PICC.  Do not use a syringe that is less than 10 mL to flush the PICC.  Avoid blood pressure checks on the arm in which the PICC is placed.  Never pull or tug on the PICC.  Do not take the PICC out yourself. Only a trained clinical professional should remove the PICC.  Use clean and sterile supplies only. Keep the supplies in a dry place. Do not reuse needles, syringes, or any other supplies. Doing that can lead to infection.  Keep pets and children away from your PICC line.  Check the PICC insertion site every day for signs of infection. Check for: ? Leakage. ? Redness, swelling, or pain. ? Fluid or blood. ? Warmth. ? Pus or a bad smell. PICC dressing care  Keep your PICC bandage (dressing) clean and dry to prevent infection.  Do not take baths, swim, or use a hot tub until your health care provider approves. Ask your health care provider if you can take showers. You may only be allowed to take sponge baths for bathing. When you are allowed to shower: ? Ask your health care provider to teach you how to wrap the PICC line. ? Cover the PICC line with clear plastic wrap and tape to keep it dry while showering.  Follow instructions from your health care provider   about how to take care of your insertion site and dressing. Make sure you: ? Wash your hands with soap and water before you change your bandage (dressing). If soap and water are not available, use hand sanitizer. ? Change your dressing as told by your health care provider. ? Leave stitches (sutures), skin glue, or adhesive strips in place. These skin closures may need to stay in place for 2 weeks or longer. If adhesive strip edges start to loosen and curl up, you may trim the loose edges. Do not remove adhesive strips completely unless your health care provider tells you to do  that.  Change your PICC dressing if it becomes loose or wet. General instructions   Carry your PICC identification card or wear a medical alert bracelet at all times.  Keep the tube clamped at all times, unless it is being used.  Carry a smooth-edge clamp with you at all times to place on the tube if it breaks.  Do not use scissors or sharp objects near the tube.  You may bend your arm and move it freely. If your PICC is near or at the bend of your elbow, avoid activity with repeated motion at the elbow.  Avoid lifting heavy objects as told by your health care provider.  Keep all follow-up visits as told by your health care provider. This is important. Disposal of supplies  Throw away any syringes in a disposal container that is meant for sharp items (sharps container). You can buy a sharps container from a pharmacy, or you can make one by using an empty hard plastic bottle with a cover.  Place any used dressings or infusion bags into a plastic bag. Throw that bag in the trash. Contact a health care provider if:  You have pain in your arm, ear, face, or teeth.  You have a fever or chills.  You have redness, swelling, or pain around the insertion site.  You have fluid or blood coming from the insertion site.  Your insertion site feels warm to the touch.  You have pus or a bad smell coming from the insertion site.  Your skin feels hard and raised around the insertion site. Get help right away if:  Your PICC is accidentally pulled all the way out. If this happens, cover the insertion site with a bandage or gauze dressing. Do not throw the PICC away. Your health care provider will need to check it.  Your PICC was tugged or pulled and has partially come out. Do not  push the PICC back in.  You cannot flush the PICC, it is hard to flush, or the PICC leaks around the insertion site when it is flushed.  You hear a "flushing" sound when the PICC is flushed.  You feel your  heart racing or skipping beats.  There is a hole or tear in the PICC.  You have swelling in the arm in which the PICC was inserted.  You have a red streak going up your arm from where the PICC was inserted. Summary  A peripherally inserted central catheter (PICC) is a long, thin, flexible tube (catheter) that is inserted into a vein in the upper arm.  The PICC is inserted using a sterile technique by a specially trained nurse or physician. Only a trained clinical professional should remove it.  Keep your PICC identification card with you at all times.  Avoid blood pressure checks on the arm in which the PICC is placed.  If cared for   properly, a PICC can remain in place for several months. Having a PICC can also allow a person to go home from the hospital sooner. This information is not intended to replace advice given to you by your health care provider. Make sure you discuss any questions you have with your health care provider. Document Revised: 06/22/2017 Document Reviewed: 08/12/2016 Elsevier Patient Education  2020 [employer]  

## 2019-12-01 NOTE — Progress Notes (Unsigned)
Emailed Tacrolimus results of 5.0 from 11/27/19 collection to [facility] pharmacist, Dewaine Conger w/note that other labs are on [place]. Next lab will be 12/04/19

## 2019-12-02 ENCOUNTER — Other Ambulatory Visit: Payer: Self-pay | Admitting: Oncology

## 2019-12-04 ENCOUNTER — Inpatient Hospital Stay: Payer: BC Managed Care – PPO

## 2019-12-04 ENCOUNTER — Other Ambulatory Visit: Payer: Self-pay

## 2019-12-04 ENCOUNTER — Inpatient Hospital Stay: Payer: BC Managed Care – PPO | Admitting: Nurse Practitioner

## 2019-12-04 ENCOUNTER — Encounter: Payer: Self-pay | Admitting: Nurse Practitioner

## 2019-12-04 VITALS — BP 122/68 | HR 88 | Temp 98.2°F | Resp 17 | Ht 66.0 in | Wt 126.9 lb

## 2019-12-04 DIAGNOSIS — D61818 Other pancytopenia: Secondary | ICD-10-CM | POA: Diagnosis not present

## 2019-12-04 DIAGNOSIS — D649 Anemia, unspecified: Secondary | ICD-10-CM | POA: Diagnosis not present

## 2019-12-04 DIAGNOSIS — C9110 Chronic lymphocytic leukemia of B-cell type not having achieved remission: Secondary | ICD-10-CM

## 2019-12-04 DIAGNOSIS — E039 Hypothyroidism, unspecified: Secondary | ICD-10-CM | POA: Diagnosis not present

## 2019-12-04 DIAGNOSIS — Z95828 Presence of other vascular implants and grafts: Secondary | ICD-10-CM

## 2019-12-04 DIAGNOSIS — Z452 Encounter for adjustment and management of vascular access device: Secondary | ICD-10-CM

## 2019-12-04 DIAGNOSIS — E875 Hyperkalemia: Secondary | ICD-10-CM | POA: Diagnosis not present

## 2019-12-04 DIAGNOSIS — D801 Nonfamilial hypogammaglobulinemia: Secondary | ICD-10-CM | POA: Diagnosis not present

## 2019-12-04 DIAGNOSIS — D696 Thrombocytopenia, unspecified: Secondary | ICD-10-CM

## 2019-12-04 LAB — CMP (CANCER CENTER ONLY)
ALT: 39 U/L (ref 0–44)
AST: 19 U/L (ref 15–41)
Albumin: 3.8 g/dL (ref 3.5–5.0)
Alkaline Phosphatase: 125 U/L (ref 38–126)
Anion gap: 8 (ref 5–15)
BUN: 18 mg/dL (ref 6–20)
CO2: 23 mmol/L (ref 22–32)
Calcium: 9.1 mg/dL (ref 8.9–10.3)
Chloride: 112 mmol/L — ABNORMAL HIGH (ref 98–111)
Creatinine: 0.83 mg/dL (ref 0.44–1.00)
GFR, Est AFR Am: >60 mL/min (ref >60)
GFR, Estimated: >60 mL/min (ref >60)
Glucose, Bld: 136 mg/dL — ABNORMAL HIGH (ref 70–99)
Potassium: 3.5 mmol/L (ref 3.5–5.1)
Sodium: 143 mmol/L (ref 135–145)
Total Bilirubin: 0.5 mg/dL (ref 0.3–1.2)
Total Protein: 5.7 g/dL — ABNORMAL LOW (ref 6.5–8.1)

## 2019-12-04 LAB — CBC WITH DIFFERENTIAL (CANCER CENTER ONLY)
Abs Immature Granulocytes: 0.01 10*3/uL (ref 0.00–0.07)
Basophils Absolute: 0 10*3/uL (ref 0.0–0.1)
Basophils Relative: 0 %
Eosinophils Absolute: 0 10*3/uL (ref 0.0–0.5)
Eosinophils Relative: 0 %
HCT: 22.5 % — ABNORMAL LOW (ref 36.0–46.0)
Hemoglobin: 7.6 g/dL — ABNORMAL LOW (ref 12.0–15.0)
Immature Granulocytes: 0 %
Lymphocytes Relative: 63 %
Lymphs Abs: 1.9 10*3/uL (ref 0.7–4.0)
MCH: 28.4 pg (ref 26.0–34.0)
MCHC: 33.8 g/dL (ref 30.0–36.0)
MCV: 84 fL (ref 80.0–100.0)
Monocytes Absolute: 0.1 10*3/uL (ref 0.1–1.0)
Monocytes Relative: 4 %
Neutro Abs: 1 10*3/uL — ABNORMAL LOW (ref 1.7–7.7)
Neutrophils Relative %: 33 %
Platelet Count: 28 10*3/uL — ABNORMAL LOW (ref 150–400)
RBC: 2.68 MIL/uL — ABNORMAL LOW (ref 3.87–5.11)
RDW: 13.2 % (ref 11.5–15.5)
WBC Count: 3 10*3/uL — ABNORMAL LOW (ref 4.0–10.5)
nRBC: 0 % (ref 0.0–0.2)

## 2019-12-04 LAB — RETICULOCYTES
Immature Retic Fract: 12.1 % (ref 2.3–15.9)
RBC.: 2.66 MIL/uL — ABNORMAL LOW (ref 3.87–5.11)
Retic Count, Absolute: 7.4 10*3/uL — ABNORMAL LOW (ref 19.0–186.0)
Retic Ct Pct: <0.4 % — ABNORMAL LOW (ref 0.4–3.1)

## 2019-12-04 LAB — LITHIUM LEVEL: Lithium Lvl: 0.48 mmol/L — ABNORMAL LOW (ref 0.60–1.20)

## 2019-12-04 LAB — SAMPLE TO BLOOD BANK

## 2019-12-04 MED ORDER — HEPARIN SOD (PORK) LOCK FLUSH 100 UNIT/ML IV SOLN
500.0000 [IU] | Freq: Once | INTRAVENOUS | Status: AC | PRN
  Administered 2019-12-04: 500 [IU]
  Filled 2019-12-04: qty 5

## 2019-12-04 MED ORDER — SODIUM CHLORIDE 0.9% FLUSH
10.0000 mL | INTRAVENOUS | Status: DC | PRN
  Administered 2019-12-04: 10 mL
  Filled 2019-12-04: qty 10

## 2019-12-04 NOTE — Progress Notes (Signed)
Critical 7.6 hgb PLT 28. Provider Ned Card NP, made aware

## 2019-12-04 NOTE — Progress Notes (Addendum)
[place] OFFICE PROGRESS NOTE   Diagnosis: CLL, aplastic anemia  INTERVAL HISTORY:   Meagan Walsh returns as scheduled.  Overall she feels "good".  She has some weakness and dyspnea on exertion which she attributes to a low hemoglobin.  No bleeding.  No fever or cough.  Objective:  Vital signs in last 24 hours:  Blood pressure 122/68, pulse 88, temperature 98.2 F (36.8 C), temperature source Temporal, resp. rate 17, height '5\' 6"'  (1.676 m), weight 126 lb 14.4 oz (57.6 kg), SpO2 100 %.    HEENT: No thrush or ulcers. Resp: Lungs clear bilaterally. Cardio: Regular rate and rhythm. GI: Abdomen soft and nontender.  No hepatosplenomegaly. Vascular: No leg edema. Left upper extremity PICC without erythema.  Lab Results:  Lab Results  Component Value Date   WBC 3.0 (L) 12/04/2019   HGB 7.6 (L) 12/04/2019   HCT 22.5 (L) 12/04/2019   MCV 84.0 12/04/2019   PLT 28 (L) 12/04/2019   NEUTROABS 1.0 (L) 12/04/2019    Imaging:  No results found.  Medications: I have reviewed the patient's current medications.  Assessment/Plan: 1. 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 Mild splenomegaly.  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 p53 (17p13) deletion, no evidence of 11q22deletion  Bone marrow biopsy 02/26/2018-hypercellular marrow with extensive involvement by CLL, lymphocytes represent85% of all cells  Ibrutinib initiated 04/03/2018  Ibrutinib placed on hold 04/11/2018 due to onset of arthralgias  Ibrutinib resumed 04/16/2018, discontinued 04/25/2018 secondary to severe arthralgias/arthritis  Ibrutinib resumed at a dose of 140 mg daily 05/03/2018  Ibrutinib dose adjusted to 140 mg alternating with 25m 06/25/2018  Ibrutinib discontinued 07/03/2018 secondary to severe arthralgias  Acalabrutinib 08/16/2018, discontinued 11/15/2018 secondary to persistent severe transfusion dependent anemia and neutropenia/thrombocytopenia, last dose 11/14/2018  Bone marrow biopsy 11/21/2018-decreased cellularity, involvement by CLL, decreased erythroid and granulocytic precursors, decreased megakaryocytes  Cycle 1 rituximab 12/06/2018  Bone marrow biopsy 12/24/2018 at [facility]-hypocellular bone marrow (10%) involved by CLL, representing 50% of marrow cellularity; markedly decreased trilineage hematopoiesis including essentially absent erythropoiesis  Bone marrow biopsy 03/06/2027 [facility]-hypocellular bone marrow (20%) with marked reduction of maturing hematopoietic elements; numerous lymphoid aggregates consistent with involvement by CLL, representing approximately 50% of marrow cellularity by flow cytometry  [facility] by prednisone taper  Eltrombopag beginning 07/05/2019  Cyclosporine discontinued 07/08/2019  Tacrolimus 07/10/2019, discontinued 09/24/2019  [facility] sampling (<5% cellular) essentially devoid of hematopoietic elements; Persistent chronic lymphocytic leukemia, representing 23% of marrow cellularity by flow cytometric analysis; Routine cytogenetic analysis is  pending.  CT CAP 10/10/2019 showed no acute process or evidence of active lymphoma/leukemia within the chest, abdomen, or pelvis. Resection or resolution of previously described necrotic left inguinal node  Tacrolimus resumed 10/17/2019, discontinued on 10/28/2019 and resumed at a dose of 0.5 mg twice daily on 10/29/2019  2.Hypothyroidism 3.Hepatitis B surface  and core antibody positive  Hepatitis B surface antigen negative and hepatitis B core antibody -12/02/2018 4.Left lung pneumonia diagnosed 10/08/2017-completed 7 days of Levaquin 5.Left lung pneumoniaon chest x-ray 12/27/2017. Augmentin prescribed. 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 Pancytopenia secondary to CLL and a hypocellular bone marrow  G-CSF and Nplate started 7/89/3810, G-CSF changed to daily beginning 12/17/2018; G-CSF discontinued 12/31/2018, last Nplate 01/15/2019  Began prednisone 60 mg daily 01/11/19, tolerating moderately well except jitteriness, irritability, and difficulty sleeping. Tapered to 40 mg daily x1 week starting 01/24/19, reduce by 10 mg each week until discontinued. Prednisone discontinued 02/20/2019.  Promacta started 01/20/2019, dose increased to 100 mg daily 02/04/2019; dose increased to 150 mg daily 02/21/2019  IVIG daily for 2 days beginning 02/25/2019  9. 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 No lymphadenopathy or splenomegaly. Exjade beginning 06/28/2019. 13. Elevated creatinine-cyclosporine toxicity?Elevated 10/28/2019 with hyperkalemia-tacrolimus  induced? 14.Vaginal bleeding beginning 07/13/2019-Mirena device removed, uterine fibroids and possible endometrial fluid or thickening in the fundus per CT CAP on 10/10/19 15.Elevated lithium level 08/08/2019, lithium toxicity?-Lithium held 08/08/2019-08/12/2019, then resumed at a lower dose 16. Deconditioning, secondary to n/v/d 17. Orthostatic hypotension 09/15/19,received500 cc NS in clinic  Disposition: Meagan Walsh appears unchanged.  She has persistent severe pancytopenia though the platelet count has improved slightly and the ANC is stable.  She has symptomatic anemia.  We are making arrangements for a blood transfusion.  See Dr. Gearldine Shown note below for his discussion with Meagan Walsh regarding additional treatment options at this time.  She will return for a PICC flush on 12/08/2019, lab and follow-up on 12/11/2019.  She will contact the office in the interim with any problems.  Patient seen with Dr. Benay Spice.  Ned Card ANP/GNP-BC   12/04/2019  11:19 AM This was a shared visit with Ned Card.  Meagan Walsh appears stable.  She has persistent transfusion dependent anemia.  The platelet count and absolute neutrophil count have stabilized over the past few weeks.  I have discussed the case with Dr. Posey Pronto at the [facility], and Drs. Oretha Ellis and Coombs at [facility].  We have discussed treatment options including continuing the present supportive care, repeat treatment with ATG (rabbit), Campath, and allogeneic transplant.  She understands it is unlikely she will enter a long-term remission with continuation of the current treatment or repeat ATG therapy.  Meagan Walsh understands the potential morbidity/mortality associated with allogeneic transplant.  We discussed the potential for toxicity associated with Campath.  She understands there is a possibility of obtaining a long-lasting remission with Campath.  She is concerned the transplant option may no longer be available if she has significant toxicity with  the Campath or if the Campath is not helpful.  She plans to discuss options further with Dr. Prince Solian and Dr. Oretha Ellis, but is leaning toward proceeding with transplant.  I offered to schedule an appointment at the [facility], but she does not wish to pursue this at present.  [name], MD

## 2019-12-05 ENCOUNTER — Other Ambulatory Visit: Payer: Self-pay

## 2019-12-05 ENCOUNTER — Telehealth: Payer: Self-pay | Admitting: Oncology

## 2019-12-05 ENCOUNTER — Telehealth: Payer: Self-pay

## 2019-12-05 ENCOUNTER — Other Ambulatory Visit: Payer: Self-pay | Admitting: Nurse Practitioner

## 2019-12-05 ENCOUNTER — Inpatient Hospital Stay: Payer: BC Managed Care – PPO

## 2019-12-05 DIAGNOSIS — E875 Hyperkalemia: Secondary | ICD-10-CM | POA: Diagnosis not present

## 2019-12-05 DIAGNOSIS — E039 Hypothyroidism, unspecified: Secondary | ICD-10-CM | POA: Diagnosis not present

## 2019-12-05 DIAGNOSIS — D649 Anemia, unspecified: Secondary | ICD-10-CM

## 2019-12-05 DIAGNOSIS — C9110 Chronic lymphocytic leukemia of B-cell type not having achieved remission: Secondary | ICD-10-CM

## 2019-12-05 DIAGNOSIS — D61818 Other pancytopenia: Secondary | ICD-10-CM | POA: Diagnosis not present

## 2019-12-05 DIAGNOSIS — D801 Nonfamilial hypogammaglobulinemia: Secondary | ICD-10-CM | POA: Diagnosis not present

## 2019-12-05 LAB — PREPARE RBC (CROSSMATCH)

## 2019-12-05 MED ORDER — HEPARIN SOD (PORK) LOCK FLUSH 100 UNIT/ML IV SOLN
250.0000 [IU] | INTRAVENOUS | Status: AC | PRN
  Administered 2019-12-05: 250 [IU]
  Filled 2019-12-05: qty 5

## 2019-12-05 MED ORDER — SODIUM CHLORIDE 0.9% FLUSH
10.0000 mL | INTRAVENOUS | Status: AC | PRN
  Administered 2019-12-05: 10 mL
  Filled 2019-12-05: qty 10

## 2019-12-05 MED ORDER — HEPARIN SOD (PORK) LOCK FLUSH 100 UNIT/ML IV SOLN
500.0000 [IU] | Freq: Every day | INTRAVENOUS | Status: DC | PRN
  Filled 2019-12-05: qty 5

## 2019-12-05 MED ORDER — SODIUM CHLORIDE 0.9% IV SOLUTION
250.0000 mL | Freq: Once | INTRAVENOUS | Status: AC
  Administered 2019-12-05: 250 mL via INTRAVENOUS
  Filled 2019-12-05: qty 250

## 2019-12-05 NOTE — Telephone Encounter (Signed)
Spoke with pr regarding her schedule next week

## 2019-12-05 NOTE — Telephone Encounter (Signed)
Scheduled appt per 5/14 sch message - pt aware of appt date and time   

## 2019-12-05 NOTE — Patient Instructions (Signed)

## 2019-12-06 LAB — TYPE AND SCREEN
ABO/RH(D): O POS
Antibody Screen: NEGATIVE
Unit division: 0

## 2019-12-06 LAB — BPAM RBC
Blood Product Expiration Date: 202106102359
ISSUE DATE / TIME: 202105141313
Unit Type and Rh: 5100

## 2019-12-06 LAB — TACROLIMUS LEVEL: Tacrolimus (FK506) - LabCorp: 5 ng/mL (ref 2.0–20.0)

## 2019-12-08 ENCOUNTER — Other Ambulatory Visit: Payer: Self-pay

## 2019-12-08 ENCOUNTER — Inpatient Hospital Stay: Payer: BC Managed Care – PPO

## 2019-12-08 DIAGNOSIS — Z452 Encounter for adjustment and management of vascular access device: Secondary | ICD-10-CM

## 2019-12-08 DIAGNOSIS — D801 Nonfamilial hypogammaglobulinemia: Secondary | ICD-10-CM | POA: Diagnosis not present

## 2019-12-08 DIAGNOSIS — Z95828 Presence of other vascular implants and grafts: Secondary | ICD-10-CM

## 2019-12-08 DIAGNOSIS — E875 Hyperkalemia: Secondary | ICD-10-CM | POA: Diagnosis not present

## 2019-12-08 DIAGNOSIS — E039 Hypothyroidism, unspecified: Secondary | ICD-10-CM | POA: Diagnosis not present

## 2019-12-08 DIAGNOSIS — D696 Thrombocytopenia, unspecified: Secondary | ICD-10-CM

## 2019-12-08 DIAGNOSIS — D61818 Other pancytopenia: Secondary | ICD-10-CM | POA: Diagnosis not present

## 2019-12-08 DIAGNOSIS — C9110 Chronic lymphocytic leukemia of B-cell type not having achieved remission: Secondary | ICD-10-CM | POA: Diagnosis not present

## 2019-12-08 MED ORDER — SODIUM CHLORIDE 0.9% FLUSH
10.0000 mL | INTRAVENOUS | Status: DC | PRN
  Administered 2019-12-08: 10 mL
  Filled 2019-12-08: qty 10

## 2019-12-08 MED ORDER — HEPARIN SOD (PORK) LOCK FLUSH 100 UNIT/ML IV SOLN
500.0000 [IU] | Freq: Once | INTRAVENOUS | Status: AC | PRN
  Administered 2019-12-08: 500 [IU]
  Filled 2019-12-08: qty 5

## 2019-12-11 ENCOUNTER — Inpatient Hospital Stay (HOSPITAL_BASED_OUTPATIENT_CLINIC_OR_DEPARTMENT_OTHER): Payer: BC Managed Care – PPO | Admitting: Oncology

## 2019-12-11 ENCOUNTER — Inpatient Hospital Stay: Payer: BC Managed Care – PPO

## 2019-12-11 ENCOUNTER — Other Ambulatory Visit: Payer: Self-pay | Admitting: Oncology

## 2019-12-11 ENCOUNTER — Other Ambulatory Visit: Payer: Self-pay

## 2019-12-11 VITALS — BP 113/67 | HR 77 | Temp 97.9°F | Resp 18 | Ht 66.0 in | Wt 127.3 lb

## 2019-12-11 DIAGNOSIS — D649 Anemia, unspecified: Secondary | ICD-10-CM

## 2019-12-11 DIAGNOSIS — D801 Nonfamilial hypogammaglobulinemia: Secondary | ICD-10-CM | POA: Diagnosis not present

## 2019-12-11 DIAGNOSIS — E875 Hyperkalemia: Secondary | ICD-10-CM | POA: Diagnosis not present

## 2019-12-11 DIAGNOSIS — C9110 Chronic lymphocytic leukemia of B-cell type not having achieved remission: Secondary | ICD-10-CM

## 2019-12-11 DIAGNOSIS — D61818 Other pancytopenia: Secondary | ICD-10-CM | POA: Diagnosis not present

## 2019-12-11 DIAGNOSIS — E039 Hypothyroidism, unspecified: Secondary | ICD-10-CM | POA: Diagnosis not present

## 2019-12-11 LAB — SAMPLE TO BLOOD BANK

## 2019-12-11 LAB — CBC WITH DIFFERENTIAL (CANCER CENTER ONLY)
Abs Immature Granulocytes: 0 10*3/uL (ref 0.00–0.07)
Basophils Absolute: 0 10*3/uL (ref 0.0–0.1)
Basophils Relative: 0 %
Eosinophils Absolute: 0 10*3/uL (ref 0.0–0.5)
Eosinophils Relative: 0 %
HCT: 23.5 % — ABNORMAL LOW (ref 36.0–46.0)
Hemoglobin: 8 g/dL — ABNORMAL LOW (ref 12.0–15.0)
Immature Granulocytes: 0 %
Lymphocytes Relative: 62 %
Lymphs Abs: 2.1 10*3/uL (ref 0.7–4.0)
MCH: 28.2 pg (ref 26.0–34.0)
MCHC: 34 g/dL (ref 30.0–36.0)
MCV: 82.7 fL (ref 80.0–100.0)
Monocytes Absolute: 0.2 10*3/uL (ref 0.1–1.0)
Monocytes Relative: 5 %
Neutro Abs: 1.1 10*3/uL — ABNORMAL LOW (ref 1.7–7.7)
Neutrophils Relative %: 33 %
Platelet Count: 31 10*3/uL — ABNORMAL LOW (ref 150–400)
RBC: 2.84 MIL/uL — ABNORMAL LOW (ref 3.87–5.11)
RDW: 13.3 % (ref 11.5–15.5)
WBC Count: 3.4 10*3/uL — ABNORMAL LOW (ref 4.0–10.5)
nRBC: 0 % (ref 0.0–0.2)

## 2019-12-11 LAB — CMP (CANCER CENTER ONLY)
ALT: 37 U/L (ref 0–44)
AST: 19 U/L (ref 15–41)
Albumin: 3.8 g/dL (ref 3.5–5.0)
Alkaline Phosphatase: 129 U/L — ABNORMAL HIGH (ref 38–126)
Anion gap: 7 (ref 5–15)
BUN: 18 mg/dL (ref 6–20)
CO2: 25 mmol/L (ref 22–32)
Calcium: 9.1 mg/dL (ref 8.9–10.3)
Chloride: 110 mmol/L (ref 98–111)
Creatinine: 0.74 mg/dL (ref 0.44–1.00)
GFR, Est AFR Am: >60 mL/min (ref >60)
GFR, Estimated: >60 mL/min (ref >60)
Glucose, Bld: 135 mg/dL — ABNORMAL HIGH (ref 70–99)
Potassium: 3.4 mmol/L — ABNORMAL LOW (ref 3.5–5.1)
Sodium: 142 mmol/L (ref 135–145)
Total Bilirubin: 0.4 mg/dL (ref 0.3–1.2)
Total Protein: 5.7 g/dL — ABNORMAL LOW (ref 6.5–8.1)

## 2019-12-11 LAB — LITHIUM LEVEL: Lithium Lvl: 0.32 mmol/L — ABNORMAL LOW (ref 0.60–1.20)

## 2019-12-11 MED FILL — PROMACTA 50 MG TABLET: 50 | 30 days supply | Qty: 90 | Fill #2

## 2019-12-11 NOTE — Progress Notes (Signed)
[place] OFFICE PROGRESS NOTE   Diagnosis: CLL, aplastic anemia  INTERVAL HISTORY:   Meagan Walsh returns as scheduled.  She feels well.  No fever.  She has mild vaginal spotting.  No other bleeding.  She reports occasional diarrhea.  She does not feel in need of a red cell transfusion today.  She reports [facility] plans to proceed with the allogeneic transplant in mid June.  Objective:  Vital signs in last 24 hours:  Blood pressure 113/67, pulse 77, temperature 97.9 F (36.6 C), temperature source Temporal, resp. rate 18, height '5\' 6"'  (1.676 m), weight 127 lb 4.8 oz (57.7 kg), SpO2 100 %.    Lymphatics: No cervical or supraclavicular nodes Resp: Lungs clear bilaterally Cardio: Regular rate and rhythm GI: No hepatosplenomegaly Vascular: No leg edema  Skin: Scattered small ecchymoses over the trunk and extremities    Lab Results:  Lab Results  Component Value Date   WBC 3.4 (L) 12/11/2019   HGB 8.0 (L) 12/11/2019   HCT 23.5 (L) 12/11/2019   MCV 82.7 12/11/2019   PLT 31 (L) 12/11/2019   NEUTROABS 1.1 (L) 12/11/2019    CMP  Lab Results  Component Value Date   NA 142 12/11/2019   K 3.4 (L) 12/11/2019   CL 110 12/11/2019   CO2 25 12/11/2019   GLUCOSE 135 (H) 12/11/2019   BUN 18 12/11/2019   CREATININE 0.74 12/11/2019   CALCIUM 9.1 12/11/2019   PROT 5.7 (L) 12/11/2019   ALBUMIN 3.8 12/11/2019   AST 19 12/11/2019   ALT 37 12/11/2019   ALKPHOS 129 (H) 12/11/2019   BILITOT 0.4 12/11/2019   GFRNONAA >60 12/11/2019   GFRAA >60 12/11/2019     Medications: I have reviewed the patient's current medications.   Assessment/Plan: 1. 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  Mild splenomegaly.  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, no evidence of 11q22deletion  Bone marrow biopsy 02/26/2018-hypercellular marrow with extensive involvement by CLL, lymphocytes represent85% of all cells  Ibrutinib initiated 04/03/2018  Ibrutinib placed on hold 04/11/2018 due to onset of arthralgias  Ibrutinib resumed 04/16/2018, discontinued 04/25/2018 secondary to severe arthralgias/arthritis  Ibrutinib resumed at a dose of 140 mg daily 05/03/2018  Ibrutinib dose adjusted to 140 mg alternating with 292m 06/25/2018  Ibrutinib discontinued 07/03/2018 secondary to severe arthralgias  Acalabrutinib 08/16/2018, discontinued 11/15/2018 secondary to persistent severe transfusion dependent anemia and neutropenia/thrombocytopenia, last dose 11/14/2018  Bone marrow biopsy 11/21/2018-decreased cellularity, involvement by CLL, decreased erythroid and granulocytic precursors, decreased megakaryocytes  Cycle 1 rituximab 12/06/2018  Bone marrow biopsy 12/24/2018 at [facility]-hypocellular bone marrow (10%) involved by CLL, representing 50% of marrow cellularity; markedly decreased trilineage hematopoiesis including essentially absent erythropoiesis  Bone marrow biopsy 03/06/2027 [facility]-hypocellular bone marrow (20%) with marked reduction of maturing hematopoietic elements; numerous lymphoid aggregates consistent with involvement by CLL, representing  approximately 50% of marrow cellularity by flow cytometry  [facility] at [facility] beginning 03/19/2019 followed by prednisone taper  Eltrombopag beginning 07/05/2019  Cyclosporine discontinued 07/08/2019  Tacrolimus 07/10/2019, discontinued 09/24/2019  Bone marrow biopsy 10/07/2019 at [facility] - Scant bone marrow sampling (<5% cellular) essentially devoid of hematopoietic elements; Persistent chronic lymphocytic leukemia, representing 23% of marrow cellularity by flow cytometric analysis; Routine cytogenetic analysis is pending.  CT CAP 10/10/2019 showed no acute process or evidence of active lymphoma/leukemia within the chest, abdomen, or pelvis. Resection or resolution of previously described necrotic left inguinal node  Tacrolimus resumed 10/17/2019, discontinued on 10/28/2019 and resumed at a dose of 0.5 mg twice daily on 10/29/2019, tacrolimus escalated to 1 mg twice daily 11/14/2019  2.Hypothyroidism 3.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 Augmentin prescribed. 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 Pancytopenia secondary to CLL and a hypocellular bone marrow  G-CSF and Nplate started 11/23/8880, G-CSF changed to daily beginning 12/17/2018; G-CSF discontinued 12/31/2018, last Nplate 01/15/2019  Began prednisone 60 mg daily 01/11/19, tolerating moderately well except jitteriness, irritability, and difficulty sleeping. Tapered to 40 mg daily x1 week starting 01/24/19, reduce by 10 mg each week until discontinued. Prednisone discontinued 02/20/2019.  Promacta started  01/20/2019, dose increased to 100 mg daily 02/04/2019; dose increased to 150 mg daily 02/21/2019  IVIG daily for 2 days beginning 02/25/2019  9. 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 No lymphadenopathy or splenomegaly. Exjade beginning 06/28/2019. 13. Elevated creatinine-cyclosporine toxicity?Elevated 10/28/2019 with hyperkalemia-tacrolimus induced? 14.Vaginal bleeding beginning 07/13/2019-Mirena device removed, uterine fibroids and possible endometrial fluid or thickening in the fundus per CT CAP on 10/10/19 15.Elevated lithium level 08/08/2019, lithium toxicity?-Lithium held 08/08/2019-08/12/2019, then resumed at a lower dose 16. Deconditioning, secondary to n/v/d 17. Orthostatic hypotension 09/15/19,received500 cc NS in clinic    Disposition: Ms. Spainhower appears stable.  She discussed the case with Dr. Prince Solian last week.  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  Ms. Strausser discussed transplant planning with the [facility] team earlier this week and reports the transplant will likely occur in mid June.  She will continue tacrolimus/eltrombopag and transfusion support here as needed.  No transfusion is planned for this week.  She will return for an office visit next week with a red cell transfusion as needed.  Betsy Coder, MD  12/11/2019  12:01 PM

## 2019-12-12 ENCOUNTER — Telehealth: Payer: Self-pay | Admitting: Oncology

## 2019-12-12 NOTE — Telephone Encounter (Signed)
Scheduled appt per 5/20 los.  Left a detailed vm of the appt date and time.

## 2019-12-13 LAB — TACROLIMUS LEVEL: Tacrolimus (FK506) - LabCorp: 6.4 ng/mL (ref 2.0–20.0)

## 2019-12-15 DIAGNOSIS — Z7682 Awaiting organ transplant status: Principal | ICD-10-CM

## 2019-12-16 ENCOUNTER — Telehealth: Payer: Self-pay | Admitting: *Deleted

## 2019-12-16 DIAGNOSIS — Z7682 Awaiting organ transplant status: Principal | ICD-10-CM

## 2019-12-16 NOTE — Telephone Encounter (Signed)
Having dental cleaning (routine) on 12/23/19. Asking for letter/note giving clearance from Dr. Benay Spice her counts are adequate for this to be done safely. Fax [phone]. Informed him that patient is being seen tomorrow.

## 2019-12-17 ENCOUNTER — Other Ambulatory Visit: Payer: Self-pay | Admitting: *Deleted

## 2019-12-17 ENCOUNTER — Inpatient Hospital Stay: Payer: BC Managed Care – PPO

## 2019-12-17 ENCOUNTER — Telehealth: Payer: Self-pay | Admitting: Oncology

## 2019-12-17 ENCOUNTER — Other Ambulatory Visit: Payer: Self-pay

## 2019-12-17 ENCOUNTER — Inpatient Hospital Stay (HOSPITAL_BASED_OUTPATIENT_CLINIC_OR_DEPARTMENT_OTHER): Payer: BC Managed Care – PPO | Admitting: Oncology

## 2019-12-17 VITALS — BP 121/64 | HR 80 | Temp 97.8°F | Resp 20 | Wt 129.8 lb

## 2019-12-17 DIAGNOSIS — E875 Hyperkalemia: Secondary | ICD-10-CM | POA: Diagnosis not present

## 2019-12-17 DIAGNOSIS — C9110 Chronic lymphocytic leukemia of B-cell type not having achieved remission: Secondary | ICD-10-CM

## 2019-12-17 DIAGNOSIS — D649 Anemia, unspecified: Secondary | ICD-10-CM

## 2019-12-17 DIAGNOSIS — E039 Hypothyroidism, unspecified: Secondary | ICD-10-CM | POA: Diagnosis not present

## 2019-12-17 DIAGNOSIS — D801 Nonfamilial hypogammaglobulinemia: Secondary | ICD-10-CM | POA: Diagnosis not present

## 2019-12-17 DIAGNOSIS — D61818 Other pancytopenia: Secondary | ICD-10-CM | POA: Diagnosis not present

## 2019-12-17 LAB — CMP (CANCER CENTER ONLY)
ALT: 28 U/L (ref 0–44)
AST: 14 U/L — ABNORMAL LOW (ref 15–41)
Albumin: 3.7 g/dL (ref 3.5–5.0)
Alkaline Phosphatase: 111 U/L (ref 38–126)
Anion gap: 7 (ref 5–15)
BUN: 18 mg/dL (ref 6–20)
CO2: 26 mmol/L (ref 22–32)
Calcium: 9.1 mg/dL (ref 8.9–10.3)
Chloride: 111 mmol/L (ref 98–111)
Creatinine: 0.83 mg/dL (ref 0.44–1.00)
GFR, Est AFR Am: >60 mL/min (ref >60)
GFR, Estimated: >60 mL/min (ref >60)
Glucose, Bld: 149 mg/dL — ABNORMAL HIGH (ref 70–99)
Potassium: 3.6 mmol/L (ref 3.5–5.1)
Sodium: 144 mmol/L (ref 135–145)
Total Bilirubin: 0.5 mg/dL (ref 0.3–1.2)
Total Protein: 5.5 g/dL — ABNORMAL LOW (ref 6.5–8.1)

## 2019-12-17 LAB — PREPARE RBC (CROSSMATCH)

## 2019-12-17 LAB — CBC WITH DIFFERENTIAL (CANCER CENTER ONLY)
Abs Immature Granulocytes: 0.01 10*3/uL (ref 0.00–0.07)
Basophils Absolute: 0 10*3/uL (ref 0.0–0.1)
Basophils Relative: 0 %
Eosinophils Absolute: 0 10*3/uL (ref 0.0–0.5)
Eosinophils Relative: 0 %
HCT: 20 % — ABNORMAL LOW (ref 36.0–46.0)
Hemoglobin: 6.7 g/dL — CL (ref 12.0–15.0)
Immature Granulocytes: 0 %
Lymphocytes Relative: 59 %
Lymphs Abs: 1.7 10*3/uL (ref 0.7–4.0)
MCH: 28 pg (ref 26.0–34.0)
MCHC: 33.5 g/dL (ref 30.0–36.0)
MCV: 83.7 fL (ref 80.0–100.0)
Monocytes Absolute: 0.2 10*3/uL (ref 0.1–1.0)
Monocytes Relative: 6 %
Neutro Abs: 1 10*3/uL — ABNORMAL LOW (ref 1.7–7.7)
Neutrophils Relative %: 35 %
Platelet Count: 35 10*3/uL — ABNORMAL LOW (ref 150–400)
RBC: 2.39 MIL/uL — ABNORMAL LOW (ref 3.87–5.11)
RDW: 13.5 % (ref 11.5–15.5)
WBC Count: 2.9 10*3/uL — ABNORMAL LOW (ref 4.0–10.5)
nRBC: 0 % (ref 0.0–0.2)

## 2019-12-17 LAB — SAMPLE TO BLOOD BANK

## 2019-12-17 MED ORDER — SODIUM CHLORIDE 0.9% IV SOLUTION
250.0000 mL | Freq: Once | INTRAVENOUS | Status: AC
  Administered 2019-12-17: 250 mL via INTRAVENOUS
  Filled 2019-12-17: qty 250

## 2019-12-17 MED ORDER — HEPARIN SOD (PORK) LOCK FLUSH 100 UNIT/ML IV SOLN
250.0000 [IU] | INTRAVENOUS | Status: AC | PRN
  Administered 2019-12-17: 250 [IU]
  Filled 2019-12-17: qty 5

## 2019-12-17 MED ORDER — SODIUM CHLORIDE 0.9% FLUSH
10.0000 mL | INTRAVENOUS | Status: AC | PRN
  Administered 2019-12-17: 10 mL
  Filled 2019-12-17: qty 10

## 2019-12-17 NOTE — Patient Instructions (Signed)

## 2019-12-17 NOTE — Progress Notes (Signed)
Faxed clearance note and lab results to Dr. Cranford [facility] at [phone] w/copy of lab results.

## 2019-12-17 NOTE — Progress Notes (Signed)
[place] OFFICE PROGRESS NOTE   Diagnosis: CLL, aplastic anemia  INTERVAL HISTORY:   [name] returns as scheduled.  She was last transfused with packed red blood cells on 12/05/2019.  Good appetite.  She is exercising.  Diarrhea has improved.  She continues eltrombopag and tacrolimus.  No fever or bleeding.  Objective:  Vital signs in last 24 hours:  Blood pressure 121/64, pulse 80, temperature 97.8 F (36.6 C), temperature source Oral, resp. rate 20, weight 129 lb 12.8 oz (58.9 kg), SpO2 100 %.    HEENT: No thrush or bleeding, mild gum hypertrophy Lymphatics: No cervical or supraclavicular nodes GI: No hepatosplenomegaly Vascular: No leg edema   Portacath/PICC-without erythema  Lab Results:  Lab Results  Component Value Date   WBC 2.9 (L) 12/17/2019   HGB 6.7 (LL) 12/17/2019   HCT 20.0 (L) 12/17/2019   MCV 83.7 12/17/2019   PLT 35 (L) 12/17/2019   NEUTROABS 1.0 (L) 12/17/2019    CMP  Lab Results  Component Value Date   NA 144 12/17/2019   K 3.6 12/17/2019   CL 111 12/17/2019   CO2 26 12/17/2019   GLUCOSE 149 (H) 12/17/2019   BUN 18 12/17/2019   CREATININE 0.83 12/17/2019   CALCIUM 9.1 12/17/2019   PROT 5.5 (L) 12/17/2019   ALBUMIN 3.7 12/17/2019   AST 14 (L) 12/17/2019   ALT 28 12/17/2019   ALKPHOS 111 12/17/2019   BILITOT 0.5 12/17/2019   GFRNONAA >60 12/17/2019   GFRAA >60 12/17/2019     Medications: I have reviewed the patient's current medications.   Assessment/Plan: 1. 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 Mild splenomegaly.  Ultrasound-guided biopsy of the left inguinal lymph node 09/18/2017, slightly "purulent "fluid aspirated, core  biopsy is consistent with an atypical lymphoid proliferation-extensive necrosis with surrounding epithelioid histiocytes, limited intact lymphoid tissue involved with CLL  Incisional biopsy of a necrotic/purulent left inguinal lymph node on 10/01/2017-extensive necrosis with granulomatous inflammation, small amount of viable lymphoid tissue involved with CLL, AFB and fungal stains negative  Peripheral blood FISH analysis 02/05/2018-deletion 13q14, no evidence of p53 (17p13) deletion, no evidence of 11q22deletion  Bone marrow biopsy 02/26/2018-hypercellular marrow with extensive involvement by CLL, lymphocytes represent85% of all cells  Ibrutinib initiated 04/03/2018  Ibrutinib placed on hold 04/11/2018 due to onset of arthralgias  Ibrutinib resumed 04/16/2018, discontinued 04/25/2018 secondary to severe arthralgias/arthritis  Ibrutinib resumed at a dose of 140 mg daily 05/03/2018  Ibrutinib dose adjusted to 140 mg alternating with 262m 06/25/2018  Ibrutinib discontinued 07/03/2018 secondary to severe arthralgias  Acalabrutinib 08/16/2018, discontinued 11/15/2018 secondary to persistent severe transfusion dependent anemia and neutropenia/thrombocytopenia, last dose 11/14/2018  Bone marrow biopsy 11/21/2018-decreased cellularity, involvement by CLL, decreased erythroid and granulocytic precursors, decreased megakaryocytes  Cycle 1 rituximab 12/06/2018  Bone marrow biopsy 12/24/2018 at [facility]-hypocellular bone marrow (10%) involved by CLL, representing 50% of marrow cellularity; markedly decreased trilineage hematopoiesis including essentially absent erythropoiesis  Bone marrow biopsy 03/06/2027 [facility]-hypocellular bone marrow (20%) with marked reduction of maturing hematopoietic elements; numerous lymphoid aggregates consistent with involvement by CLL, representing approximately 50% of marrow cellularity by flow cytometry  [facility] followed by prednisone  taper  Eltrombopag beginning 07/05/2019  Cyclosporine discontinued 07/08/2019  Tacrolimus 07/10/2019, discontinued 09/24/2019  [facility]  [facility] bone marrow sampling (<5% cellular) essentially devoid of hematopoietic elements; Persistent chronic lymphocytic leukemia, representing 23% of marrow cellularity by flow cytometric analysis; Routine cytogenetic analysis is pending.  CT CAP 10/10/2019 showed no acute process or evidence of active lymphoma/leukemia within the chest, abdomen, or pelvis. Resection or resolution of previously described necrotic left inguinal node  Tacrolimus resumed 10/17/2019, discontinued on 10/28/2019 and resumed at a dose of 0.5 mg twice daily on 10/29/2019, tacrolimus escalated to 1 mg twice daily 11/14/2019  2.Hypothyroidism 3.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 Augmentin prescribed. 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 Pancytopenia secondary to CLL and a hypocellular bone marrow  G-CSF and Nplate started 3/79/4327, G-CSF changed to daily beginning 12/17/2018; G-CSF discontinued 12/31/2018, last Nplate 01/15/2019  Began prednisone 60 mg daily 01/11/19, tolerating moderately well except jitteriness, irritability, and difficulty sleeping. Tapered to 40 mg daily x1 week starting 01/24/19, reduce by 10 mg each week until discontinued. Prednisone discontinued 02/20/2019.  Promacta started 01/20/2019, dose increased to 100 mg daily 02/04/2019; dose increased to 150 mg daily 02/21/2019  IVIG daily for 2 days beginning  02/25/2019  9. 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 No lymphadenopathy or splenomegaly. Exjade beginning 06/28/2019. 13. Elevated creatinine-cyclosporine toxicity?Elevated 10/28/2019 with hyperkalemia-tacrolimus induced? 14.Vaginal bleeding beginning 07/13/2019-Mirena device removed, uterine fibroids and possible endometrial fluid or thickening in the fundus per CT CAP on 10/10/19 15.Elevated lithium level 08/08/2019, lithium toxicity?-Lithium held 08/08/2019-08/12/2019, then resumed at a lower dose 16. Deconditioning, secondary to n/v/d 17. Orthostatic hypotension 09/15/19,received500 cc NS in clinic    Disposition: Ms. Lamson appears stable.  The neutrophil count and platelets remain adequate, but she has developed progressive anemia.  She will be transfused with packed red blood cells today.  She continues tacrolimus and eltrombopag.  Ms. Standiford has been scheduled for the allogeneic transplant 01/09/2020.  She will undergo pretransplant testing over the next few weeks.  She is scheduled to take a vacation beginning 12/27/2019.  Ms. Brubacher will return for PICC care on 12/23/2019.  She will be scheduled for an office visit on 01/02/2020.  She will contact us in the interim as needed.  She will have lab studies at [facility] in the interim.  Betsy Coder, MD  12/17/2019  3:15 PM

## 2019-12-17 NOTE — Progress Notes (Signed)
Notified Beth in blood bank that she needs 1 unit today. Called back and made blood bank aware she will now receive 2 units today

## 2019-12-17 NOTE — Telephone Encounter (Signed)
Scheduled appt per 5/26 los - pt is aware of appts.

## 2019-12-17 NOTE — Progress Notes (Signed)
LATE ENTRY-Received critical value of Hgb 6.7.  Reported value to Dr. Benay Spice.  Patient already scheduled for blood transfusion and charge nurse aware.  Gardiner Rhyme, RN

## 2019-12-18 LAB — TYPE AND SCREEN
ABO/RH(D): O POS
Antibody Screen: NEGATIVE
Unit division: 0
Unit division: 0

## 2019-12-18 LAB — BPAM RBC
Blood Product Expiration Date: 202106232359
Blood Product Expiration Date: 202106232359
ISSUE DATE / TIME: 202105261024
ISSUE DATE / TIME: 202105261024
Unit Type and Rh: 5100
Unit Type and Rh: 5100

## 2019-12-19 ENCOUNTER — Inpatient Hospital Stay: Payer: BC Managed Care – PPO

## 2019-12-19 ENCOUNTER — Encounter: Payer: Self-pay | Admitting: *Deleted

## 2019-12-19 ENCOUNTER — Other Ambulatory Visit: Payer: Self-pay

## 2019-12-19 DIAGNOSIS — E039 Hypothyroidism, unspecified: Secondary | ICD-10-CM | POA: Diagnosis not present

## 2019-12-19 DIAGNOSIS — E875 Hyperkalemia: Secondary | ICD-10-CM | POA: Diagnosis not present

## 2019-12-19 DIAGNOSIS — Z95828 Presence of other vascular implants and grafts: Secondary | ICD-10-CM

## 2019-12-19 DIAGNOSIS — C9110 Chronic lymphocytic leukemia of B-cell type not having achieved remission: Secondary | ICD-10-CM | POA: Diagnosis not present

## 2019-12-19 DIAGNOSIS — Z452 Encounter for adjustment and management of vascular access device: Secondary | ICD-10-CM

## 2019-12-19 DIAGNOSIS — D696 Thrombocytopenia, unspecified: Secondary | ICD-10-CM

## 2019-12-19 DIAGNOSIS — D61818 Other pancytopenia: Secondary | ICD-10-CM | POA: Diagnosis not present

## 2019-12-19 DIAGNOSIS — D801 Nonfamilial hypogammaglobulinemia: Secondary | ICD-10-CM | POA: Diagnosis not present

## 2019-12-19 LAB — TACROLIMUS LEVEL: Tacrolimus (FK506) - LabCorp: 6.2 ng/mL (ref 2.0–20.0)

## 2019-12-19 MED ORDER — HEPARIN SOD (PORK) LOCK FLUSH 100 UNIT/ML IV SOLN
500.0000 [IU] | Freq: Once | INTRAVENOUS | Status: AC | PRN
  Administered 2019-12-19: 500 [IU]
  Filled 2019-12-19: qty 5

## 2019-12-19 MED ORDER — SODIUM CHLORIDE 0.9% FLUSH
10.0000 mL | INTRAVENOUS | Status: DC | PRN
  Administered 2019-12-19: 10 mL
  Filled 2019-12-19: qty 10

## 2019-12-19 NOTE — Progress Notes (Signed)
Email sent to Dewaine Conger, PharmD at [facility] transplant with tacrolimus level from 12/17/19

## 2019-12-23 ENCOUNTER — Other Ambulatory Visit: Payer: Self-pay

## 2019-12-23 ENCOUNTER — Telehealth: Payer: Self-pay | Admitting: *Deleted

## 2019-12-23 ENCOUNTER — Inpatient Hospital Stay: Payer: BC Managed Care – PPO

## 2019-12-23 ENCOUNTER — Inpatient Hospital Stay: Payer: BC Managed Care – PPO | Attending: Oncology

## 2019-12-23 DIAGNOSIS — Z452 Encounter for adjustment and management of vascular access device: Secondary | ICD-10-CM

## 2019-12-23 DIAGNOSIS — D61818 Other pancytopenia: Secondary | ICD-10-CM | POA: Insufficient documentation

## 2019-12-23 DIAGNOSIS — E875 Hyperkalemia: Secondary | ICD-10-CM | POA: Diagnosis not present

## 2019-12-23 DIAGNOSIS — C9110 Chronic lymphocytic leukemia of B-cell type not having achieved remission: Secondary | ICD-10-CM | POA: Insufficient documentation

## 2019-12-23 DIAGNOSIS — D801 Nonfamilial hypogammaglobulinemia: Secondary | ICD-10-CM | POA: Diagnosis not present

## 2019-12-23 DIAGNOSIS — E039 Hypothyroidism, unspecified: Secondary | ICD-10-CM | POA: Diagnosis not present

## 2019-12-23 DIAGNOSIS — D696 Thrombocytopenia, unspecified: Secondary | ICD-10-CM

## 2019-12-23 DIAGNOSIS — Z95828 Presence of other vascular implants and grafts: Secondary | ICD-10-CM

## 2019-12-23 LAB — CBC WITH DIFFERENTIAL (CANCER CENTER ONLY)
Abs Immature Granulocytes: 0.01 10*3/uL (ref 0.00–0.07)
Basophils Absolute: 0 10*3/uL (ref 0.0–0.1)
Basophils Relative: 0 %
Eosinophils Absolute: 0 10*3/uL (ref 0.0–0.5)
Eosinophils Relative: 0 %
HCT: 27.9 % — ABNORMAL LOW (ref 36.0–46.0)
Hemoglobin: 9.5 g/dL — ABNORMAL LOW (ref 12.0–15.0)
Immature Granulocytes: 0 %
Lymphocytes Relative: 66 %
Lymphs Abs: 3.5 10*3/uL (ref 0.7–4.0)
MCH: 28.7 pg (ref 26.0–34.0)
MCHC: 34.1 g/dL (ref 30.0–36.0)
MCV: 84.3 fL (ref 80.0–100.0)
Monocytes Absolute: 0.2 10*3/uL (ref 0.1–1.0)
Monocytes Relative: 4 %
Neutro Abs: 1.6 10*3/uL — ABNORMAL LOW (ref 1.7–7.7)
Neutrophils Relative %: 30 %
Platelet Count: 38 10*3/uL — ABNORMAL LOW (ref 150–400)
RBC: 3.31 MIL/uL — ABNORMAL LOW (ref 3.87–5.11)
RDW: 14.3 % (ref 11.5–15.5)
WBC Count: 5.2 10*3/uL (ref 4.0–10.5)
nRBC: 0 % (ref 0.0–0.2)

## 2019-12-23 LAB — SAMPLE TO BLOOD BANK

## 2019-12-23 MED ORDER — HEPARIN SOD (PORK) LOCK FLUSH 100 UNIT/ML IV SOLN
500.0000 [IU] | Freq: Once | INTRAVENOUS | Status: AC | PRN
  Administered 2019-12-23: 500 [IU]
  Filled 2019-12-23: qty 5

## 2019-12-23 MED ORDER — SODIUM CHLORIDE 0.9% FLUSH
10.0000 mL | INTRAVENOUS | Status: DC | PRN
  Administered 2019-12-23: 10 mL
  Filled 2019-12-23: qty 10

## 2019-12-23 MED FILL — TACROLIMUS 1 MG CAPSULE: 1 | 30 days supply | Qty: 60 | Fill #1

## 2019-12-23 NOTE — Telephone Encounter (Signed)
Notified of CBC results. Per Dr. Benay Spice, she will be OK to wait for repeat labs here on 01/02/20. Needs PICC dressing change on 12/26/19 before she leaves town. Scheduler notified. She reports she goes to [facility] next week for testing. She will call if she feels a transfusion is needed sooner. Reports having her teeth cleaned today with minimal bleeding.

## 2019-12-24 ENCOUNTER — Encounter: Admit: 2019-12-24 | Discharge: 2019-12-25 | Payer: PRIVATE HEALTH INSURANCE

## 2019-12-24 DIAGNOSIS — C9110 Chronic lymphocytic leukemia of B-cell type not having achieved remission: Secondary | ICD-10-CM | POA: Diagnosis not present

## 2019-12-25 ENCOUNTER — Other Ambulatory Visit: Payer: Self-pay

## 2019-12-25 ENCOUNTER — Inpatient Hospital Stay: Payer: BC Managed Care – PPO

## 2019-12-26 ENCOUNTER — Ambulatory Visit: Admit: 2019-12-26 | Discharge: 2019-12-27 | Payer: PRIVATE HEALTH INSURANCE

## 2019-12-26 ENCOUNTER — Encounter: Admit: 2019-12-26 | Discharge: 2019-12-27 | Payer: PRIVATE HEALTH INSURANCE

## 2019-12-26 ENCOUNTER — Other Ambulatory Visit: Admit: 2019-12-26 | Discharge: 2019-12-27 | Payer: PRIVATE HEALTH INSURANCE

## 2019-12-26 DIAGNOSIS — J9811 Atelectasis: Secondary | ICD-10-CM | POA: Diagnosis not present

## 2019-12-26 DIAGNOSIS — C9112 Chronic lymphocytic leukemia of B-cell type in relapse: Secondary | ICD-10-CM | POA: Diagnosis not present

## 2019-12-26 DIAGNOSIS — Q676 Pectus excavatum: Secondary | ICD-10-CM | POA: Diagnosis not present

## 2019-12-26 DIAGNOSIS — D709 Neutropenia, unspecified: Secondary | ICD-10-CM | POA: Diagnosis not present

## 2019-12-26 DIAGNOSIS — C9110 Chronic lymphocytic leukemia of B-cell type not having achieved remission: Secondary | ICD-10-CM | POA: Diagnosis not present

## 2019-12-26 DIAGNOSIS — D619 Aplastic anemia, unspecified: Secondary | ICD-10-CM | POA: Diagnosis not present

## 2019-12-26 DIAGNOSIS — J90 Pleural effusion, not elsewhere classified: Secondary | ICD-10-CM | POA: Diagnosis not present

## 2019-12-26 DIAGNOSIS — Z7682 Awaiting organ transplant status: Secondary | ICD-10-CM | POA: Diagnosis not present

## 2019-12-31 ENCOUNTER — Encounter: Admit: 2019-12-31 | Discharge: 2019-12-31 | Payer: PRIVATE HEALTH INSURANCE

## 2019-12-31 ENCOUNTER — Institutional Professional Consult (permissible substitution): Admit: 2019-12-31 | Discharge: 2019-12-31 | Payer: PRIVATE HEALTH INSURANCE

## 2019-12-31 ENCOUNTER — Encounter
Admit: 2019-12-31 | Discharge: 2019-12-31 | Payer: PRIVATE HEALTH INSURANCE | Attending: Pharmacist Clinician (PhC)/ Clinical Pharmacy Specialist | Primary: Pharmacist Clinician (PhC)/ Clinical Pharmacy Specialist

## 2019-12-31 DIAGNOSIS — B1910 Unspecified viral hepatitis B without hepatic coma: Secondary | ICD-10-CM | POA: Diagnosis not present

## 2019-12-31 DIAGNOSIS — E039 Hypothyroidism, unspecified: Secondary | ICD-10-CM | POA: Diagnosis not present

## 2019-12-31 DIAGNOSIS — D801 Nonfamilial hypogammaglobulinemia: Secondary | ICD-10-CM | POA: Diagnosis not present

## 2019-12-31 DIAGNOSIS — Z7682 Awaiting organ transplant status: Secondary | ICD-10-CM | POA: Diagnosis not present

## 2019-12-31 DIAGNOSIS — C9110 Chronic lymphocytic leukemia of B-cell type not having achieved remission: Secondary | ICD-10-CM | POA: Diagnosis not present

## 2020-01-01 ENCOUNTER — Encounter: Admit: 2020-01-01 | Discharge: 2020-01-02 | Payer: PRIVATE HEALTH INSURANCE

## 2020-01-01 ENCOUNTER — Telehealth: Payer: Self-pay

## 2020-01-01 DIAGNOSIS — C9110 Chronic lymphocytic leukemia of B-cell type not having achieved remission: Secondary | ICD-10-CM | POA: Diagnosis not present

## 2020-01-01 DIAGNOSIS — R110 Nausea: Secondary | ICD-10-CM | POA: Diagnosis not present

## 2020-01-01 DIAGNOSIS — D619 Aplastic anemia, unspecified: Secondary | ICD-10-CM | POA: Diagnosis not present

## 2020-01-01 DIAGNOSIS — Z5112 Encounter for antineoplastic immunotherapy: Secondary | ICD-10-CM | POA: Diagnosis not present

## 2020-01-01 NOTE — Telephone Encounter (Signed)
Spoke with Meagan Walsh regarding her labs. She verbalizes understanding that per MD Benay Spice, he does not need to come in for her lab appt tomorrow. She will keep her flush and OV appt.

## 2020-01-02 ENCOUNTER — Other Ambulatory Visit: Payer: Self-pay

## 2020-01-02 ENCOUNTER — Inpatient Hospital Stay: Payer: BC Managed Care – PPO

## 2020-01-02 ENCOUNTER — Encounter: Payer: Self-pay | Admitting: *Deleted

## 2020-01-02 ENCOUNTER — Telehealth: Payer: Self-pay | Admitting: Oncology

## 2020-01-02 ENCOUNTER — Inpatient Hospital Stay (HOSPITAL_BASED_OUTPATIENT_CLINIC_OR_DEPARTMENT_OTHER): Payer: BC Managed Care – PPO | Admitting: Oncology

## 2020-01-02 VITALS — BP 115/67 | HR 76 | Temp 97.7°F | Resp 17 | Ht 66.0 in | Wt 127.2 lb

## 2020-01-02 DIAGNOSIS — Z452 Encounter for adjustment and management of vascular access device: Secondary | ICD-10-CM

## 2020-01-02 DIAGNOSIS — D696 Thrombocytopenia, unspecified: Secondary | ICD-10-CM

## 2020-01-02 DIAGNOSIS — E875 Hyperkalemia: Secondary | ICD-10-CM | POA: Diagnosis not present

## 2020-01-02 DIAGNOSIS — Z95828 Presence of other vascular implants and grafts: Secondary | ICD-10-CM

## 2020-01-02 DIAGNOSIS — C9110 Chronic lymphocytic leukemia of B-cell type not having achieved remission: Secondary | ICD-10-CM

## 2020-01-02 DIAGNOSIS — D61818 Other pancytopenia: Secondary | ICD-10-CM | POA: Diagnosis not present

## 2020-01-02 DIAGNOSIS — E039 Hypothyroidism, unspecified: Secondary | ICD-10-CM | POA: Diagnosis not present

## 2020-01-02 DIAGNOSIS — D801 Nonfamilial hypogammaglobulinemia: Secondary | ICD-10-CM | POA: Diagnosis not present

## 2020-01-02 MED ORDER — SODIUM CHLORIDE 0.9% FLUSH
10.0000 mL | INTRAVENOUS | Status: DC | PRN
  Administered 2020-01-02: 10 mL
  Filled 2020-01-02: qty 10

## 2020-01-02 MED ORDER — HEPARIN SOD (PORK) LOCK FLUSH 100 UNIT/ML IV SOLN
250.0000 [IU] | Freq: Once | INTRAVENOUS | Status: AC
  Administered 2020-01-02: 250 [IU]
  Filled 2020-01-02: qty 5

## 2020-01-02 NOTE — Telephone Encounter (Signed)
Scheduled appt per 6/11 los.  Pt declined calendar and avs.

## 2020-01-02 NOTE — Progress Notes (Signed)
Meagan Walsh OFFICE PROGRESS NOTE   Diagnosis: CLL, aplastic anemia  INTERVAL HISTORY:   [name] returns for a scheduled visit.  She was last transfused with packed red blood cells on 12/17/2019.  She does not feel in need of a red cell transfusion today.  She continues the pretransplant process at [facility].  This included a repeat bone marrow biopsy on 12/26/2019.  This revealed a 20% cellular marrow with involvement by CLL (20% of marrow cells).   She has discontinued the eltrombopag and pause econazole.  Meagan Walsh feels well.  Good appetite and energy level.  She is exercising.  No bleeding other than intermittent vaginal "spotting ".  She continues to have daily loose stool, 1-2 episodes.  No fever.  She has persistent limited range of motion at the shoulders with discomfort when she moves the shoulders.  Objective:  Vital signs in last 24 hours:  Blood pressure 115/67, pulse 76, temperature 97.7 F (36.5 C), temperature source Temporal, resp. rate 17, height '5\' 6"'  (1.676 m), weight 127 lb 3.2 oz (57.7 kg), SpO2 100 %.    Lymphatics: No cervical, supraclavicular, axillary, or inguinal nodes Resp: Lungs clear bilaterally Cardio: Regular rate and rhythm GI: No hepatosplenomegaly Vascular: No leg edema Skin: No petechiae  Portacath/PICC-without erythema  Lab Results:  Lab Results  Component Value Date   WBC 5.2 12/23/2019   HGB 9.5 (L) 12/23/2019   HCT 27.9 (L) 12/23/2019   MCV 84.3 12/23/2019   PLT 38 (L) 12/23/2019   NEUTROABS 1.6 (L) 12/23/2019    CMP  Lab Results  Component Value Date   NA 144 12/17/2019   K 3.6 12/17/2019   CL 111 12/17/2019   CO2 26 12/17/2019   GLUCOSE 149 (H) 12/17/2019   BUN 18 12/17/2019   CREATININE 0.83 12/17/2019   CALCIUM 9.1 12/17/2019   PROT 5.5 (L) 12/17/2019   ALBUMIN 3.7 12/17/2019   AST 14 (L) 12/17/2019   ALT 28 12/17/2019   ALKPHOS 111 12/17/2019   BILITOT 0.5 12/17/2019   GFRNONAA >60 12/17/2019   GFRAA  >60 12/17/2019   [facility] on 12/31/2019: Hemoglobin 8.6, platelets 49,000, WBC 3.9, ANC 1.2    Medications: I have reviewed the patient's current medications.   Assessment/Plan: 1. 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 Mild splenomegaly.  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, no evidence of 11q22deletion  Bone marrow biopsy 02/26/2018-hypercellular marrow with extensive involvement by CLL, lymphocytes represent85% of all cells  Ibrutinib initiated 04/03/2018  Ibrutinib placed on hold 04/11/2018 due to onset of arthralgias  Ibrutinib resumed 04/16/2018, discontinued 04/25/2018 secondary to severe arthralgias/arthritis  Ibrutinib resumed at a dose of 140 mg daily 05/03/2018  Ibrutinib dose adjusted to 140 mg alternating with 264m 06/25/2018  Ibrutinib discontinued 07/03/2018 secondary to severe arthralgias  Acalabrutinib 08/16/2018, discontinued 11/15/2018 secondary to persistent severe transfusion dependent anemia and neutropenia/thrombocytopenia, last dose  11/14/2018  Bone marrow biopsy 11/21/2018-decreased cellularity, involvement by CLL, decreased  erythroid and granulocytic precursors, decreased megakaryocytes  Cycle 1 rituximab 12/06/2018  Bone marrow biopsy 12/24/2018 at [facility]-hypocellular bone marrow (10%) involved by CLL, representing 50% of marrow cellularity; markedly decreased trilineage hematopoiesis including essentially absent erythropoiesis  Bone marrow biopsy 03/06/2027 [facility]-hypocellular bone marrow (20%) with marked reduction of maturing hematopoietic elements; numerous lymphoid aggregates consistent with involvement by CLL, representing approximately 50% of marrow cellularity by flow cytometry  ATG/cyclosporine at [facility] beginning 03/19/2019 followed by prednisone taper  Eltrombopag beginning 07/05/2019  Cyclosporine discontinued 07/08/2019  Tacrolimus 07/10/2019, discontinued 09/24/2019  Bone marrow biopsy 10/07/2019 at [facility] bone marrow sampling (<5% cellular) essentially devoid of hematopoietic elements; Persistent chronic lymphocytic leukemia, representing 23% of marrow cellularity by flow cytometric analysis; Routine cytogenetic analysis is pending.  CT CAP 10/10/2019 showed no acute process or evidence of active lymphoma/leukemia within the chest, abdomen, or pelvis. Resection or resolution of previously described necrotic left inguinal node  Tacrolimus resumed 10/17/2019, discontinued on 10/28/2019 and resumed at a dose of 0.5 mg twice daily on 10/29/2019, tacrolimus escalated to 1 mg twice daily 11/14/2019  Bone marrow biopsy at [facility] 12/26/1998 21-20% cellular marrow, CLL involving approximately 20% of the cellular component  Eltrombopag discontinued week of 12/29/2019  2.Hypothyroidism 3.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 Augmentin prescribed. 6.Anemia secondary to CLL-DAT negative,  bilirubin and LDH normal June 2019, progressive symptomatic anemia 04/01/2018, red cell transfusions 04/01/2018,followed by multiple additional red cell transfusions 7.Hypogammaglobulinemia 8. Pancytopenia secondary to CLL and a hypocellular bone marrow  G-CSF and Nplate started 2/90/2111, G-CSF changed to daily beginning 12/17/2018; G-CSF discontinued 12/31/2018, last Nplate 01/15/2019  Began prednisone 60 mg daily 01/11/19, tolerating moderately well except jitteriness, irritability, and difficulty sleeping. Tapered to 40 mg daily x1 week starting 01/24/19, reduce by 10 mg each week until discontinued. Prednisone discontinued 02/20/2019.  Promacta started 01/20/2019, dose increased to 100 mg daily 02/04/2019; dose increased to 150 mg daily 02/21/2019  IVIG daily for 2 days beginning 02/25/2019  9. 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 No lymphadenopathy or splenomegaly. Exjade beginning 06/28/2019. 13. Elevated creatinine-cyclosporine toxicity?Elevated 10/28/2019 with hyperkalemia-tacrolimus induced? 14.Vaginal bleeding beginning 07/13/2019-Mirena device removed, uterine fibroids and possible endometrial fluid or thickening in the fundus per CT CAP on 10/10/19 15.Elevated lithium level 08/08/2019, lithium toxicity?-Lithium held 08/08/2019-08/12/2019, then resumed at a lower dose 16. Deconditioning, secondary to n/v/d 17. Orthostatic hypotension 09/15/19,received500 cc NS in clinic   Disposition: Meagan Walsh appears stable.  The neutrophil count and platelets have stabilized in an adequate range over the past month, but she continues to require intermittent red cell transfusions.  The bone marrow remains hypocellular.  She has discussed treatment options with  the hematology and transplant services at [facility].  These discussions have included the option of a trial of Campath versus proceeding with allogeneic transplant.  She would like to proceed with allogeneic transplant.  We discussed the option of Campath again today.  She would like to proceed with the allogeneic transplant.  She will return for a CBC and transfusion as needed on 01/05/2020.  She can continue evaluation of the diarrhea at [facility].  A stool sample earlier this year  was negative for infection.  We will forward the recent pelvic ultrasound report to the [facility] transplant team.  She is not scheduled for a follow-up appointment here.  We are available to see her as needed and and we will plan to see her when she returns home from [facility].  Betsy Coder, MD  01/02/2020  12:53 PM

## 2020-01-05 ENCOUNTER — Inpatient Hospital Stay: Payer: BC Managed Care – PPO

## 2020-01-05 ENCOUNTER — Other Ambulatory Visit: Payer: Self-pay

## 2020-01-05 ENCOUNTER — Encounter: Payer: Self-pay | Admitting: *Deleted

## 2020-01-05 ENCOUNTER — Telehealth: Payer: Self-pay | Admitting: Psychiatry

## 2020-01-05 DIAGNOSIS — E875 Hyperkalemia: Secondary | ICD-10-CM | POA: Diagnosis not present

## 2020-01-05 DIAGNOSIS — D696 Thrombocytopenia, unspecified: Secondary | ICD-10-CM

## 2020-01-05 DIAGNOSIS — C9110 Chronic lymphocytic leukemia of B-cell type not having achieved remission: Secondary | ICD-10-CM | POA: Diagnosis not present

## 2020-01-05 DIAGNOSIS — Z95828 Presence of other vascular implants and grafts: Secondary | ICD-10-CM

## 2020-01-05 DIAGNOSIS — Z452 Encounter for adjustment and management of vascular access device: Secondary | ICD-10-CM

## 2020-01-05 DIAGNOSIS — D801 Nonfamilial hypogammaglobulinemia: Secondary | ICD-10-CM | POA: Diagnosis not present

## 2020-01-05 DIAGNOSIS — E039 Hypothyroidism, unspecified: Secondary | ICD-10-CM | POA: Diagnosis not present

## 2020-01-05 DIAGNOSIS — D61818 Other pancytopenia: Secondary | ICD-10-CM | POA: Diagnosis not present

## 2020-01-05 DIAGNOSIS — Z7682 Awaiting organ transplant status: Principal | ICD-10-CM

## 2020-01-05 LAB — CBC WITH DIFFERENTIAL (CANCER CENTER ONLY)
Abs Immature Granulocytes: 0.03 10*3/uL (ref 0.00–0.07)
Basophils Absolute: 0 10*3/uL (ref 0.0–0.1)
Basophils Relative: 0 %
Eosinophils Absolute: 0 10*3/uL (ref 0.0–0.5)
Eosinophils Relative: 1 %
HCT: 24.3 % — ABNORMAL LOW (ref 36.0–46.0)
Hemoglobin: 8.3 g/dL — ABNORMAL LOW (ref 12.0–15.0)
Immature Granulocytes: 1 %
Lymphocytes Relative: 54 %
Lymphs Abs: 1.3 10*3/uL (ref 0.7–4.0)
MCH: 29.2 pg (ref 26.0–34.0)
MCHC: 34.2 g/dL (ref 30.0–36.0)
MCV: 85.6 fL (ref 80.0–100.0)
Monocytes Absolute: 0.1 10*3/uL (ref 0.1–1.0)
Monocytes Relative: 6 %
Neutro Abs: 0.9 10*3/uL — ABNORMAL LOW (ref 1.7–7.7)
Neutrophils Relative %: 38 %
Platelet Count: 48 10*3/uL — ABNORMAL LOW (ref 150–400)
RBC: 2.84 MIL/uL — ABNORMAL LOW (ref 3.87–5.11)
RDW: 15.1 % (ref 11.5–15.5)
WBC Count: 2.5 10*3/uL — ABNORMAL LOW (ref 4.0–10.5)
nRBC: 0 % (ref 0.0–0.2)

## 2020-01-05 LAB — RETICULOCYTES
Immature Retic Fract: 11.9 % (ref 2.3–15.9)
RBC.: 2.84 MIL/uL — ABNORMAL LOW (ref 3.87–5.11)
Retic Count, Absolute: 45.7 10*3/uL (ref 19.0–186.0)
Retic Ct Pct: 1.6 % (ref 0.4–3.1)

## 2020-01-05 LAB — SAMPLE TO BLOOD BANK

## 2020-01-05 MED ORDER — SODIUM CHLORIDE 0.9% FLUSH
10.0000 mL | INTRAVENOUS | Status: DC | PRN
  Administered 2020-01-05: 10 mL
  Filled 2020-01-05: qty 10

## 2020-01-05 MED ORDER — HEPARIN SOD (PORK) LOCK FLUSH 100 UNIT/ML IV SOLN
500.0000 [IU] | Freq: Once | INTRAVENOUS | Status: AC | PRN
  Administered 2020-01-05: 250 [IU]
  Filled 2020-01-05: qty 5

## 2020-01-05 NOTE — Patient Instructions (Signed)
PICC Home Care Guide  A peripherally inserted central catheter (PICC) is a form of IV access that allows medicines and IV fluids to be quickly distributed throughout the body. The PICC is a long, thin, flexible tube (catheter) that is inserted into a vein in the upper arm. The catheter ends in a large vein in the chest (superior vena cava, or SVC). After the PICC is inserted, a chest X-ray may be done to make sure that it is in the correct place. A PICC may be placed for different reasons, such as:  To give medicines and liquid nutrition.  To give IV fluids and blood products.  If there is trouble placing a peripheral intravenous (PIV) catheter. If taken care of properly, a PICC can remain in place for several months. Having a PICC can also allow a person to go home from the hospital sooner. Medicine and PICC care can be managed at home by a family member, caregiver, or home health care team. What are the risks? Generally, having a PICC is safe. However, problems may occur, including:  A blood clot (thrombus) forming in or at the tip of the PICC.  A blood clot forming in a vein (deep vein thrombosis) or traveling to the lung (pulmonary embolism).  Inflammation of the vein (phlebitis) in which the PICC is placed.  Infection. Central line associated blood stream infection (CLABSI) is a serious infection that often requires hospitalization.  PICC movement (malposition). The PICC tip may move from its original position due to excessive physical activity, forceful coughing, sneezing, or vomiting.  A break or cut in the PICC. It is important not to use scissors near the PICC.  Nerve or tendon irritation or injury during PICC insertion. How to take care of your PICC Preventing problems  You and any caregivers should wash your hands often with soap. Wash hands: ? Before touching the PICC line or the infusion device. ? Before changing a bandage (dressing).  Flush the PICC as told by your  health care provider. Let your health care provider know right away if the PICC is hard to flush or does not flush. Do not use force to flush the PICC.  Do not use a syringe that is less than 10 mL to flush the PICC.  Avoid blood pressure checks on the arm in which the PICC is placed.  Never pull or tug on the PICC.  Do not take the PICC out yourself. Only a trained clinical professional should remove the PICC.  Use clean and sterile supplies only. Keep the supplies in a dry place. Do not reuse needles, syringes, or any other supplies. Doing that can lead to infection.  Keep pets and children away from your PICC line.  Check the PICC insertion site every day for signs of infection. Check for: ? Leakage. ? Redness, swelling, or pain. ? Fluid or blood. ? Warmth. ? Pus or a bad smell. PICC dressing care  Keep your PICC bandage (dressing) clean and dry to prevent infection.  Do not take baths, swim, or use a hot tub until your health care provider approves. Ask your health care provider if you can take showers. You may only be allowed to take sponge baths for bathing. When you are allowed to shower: ? Ask your health care provider to teach you how to wrap the PICC line. ? Cover the PICC line with clear plastic wrap and tape to keep it dry while showering.  Follow instructions from your health care provider   about how to take care of your insertion site and dressing. Make sure you: ? Wash your hands with soap and water before you change your bandage (dressing). If soap and water are not available, use hand sanitizer. ? Change your dressing as told by your health care provider. ? Leave stitches (sutures), skin glue, or adhesive strips in place. These skin closures may need to stay in place for 2 weeks or longer. If adhesive strip edges start to loosen and curl up, you may trim the loose edges. Do not remove adhesive strips completely unless your health care provider tells you to do  that.  Change your PICC dressing if it becomes loose or wet. General instructions   Carry your PICC identification card or wear a medical alert bracelet at all times.  Keep the tube clamped at all times, unless it is being used.  Carry a smooth-edge clamp with you at all times to place on the tube if it breaks.  Do not use scissors or sharp objects near the tube.  You may bend your arm and move it freely. If your PICC is near or at the bend of your elbow, avoid activity with repeated motion at the elbow.  Avoid lifting heavy objects as told by your health care provider.  Keep all follow-up visits as told by your health care provider. This is important. Disposal of supplies  Throw away any syringes in a disposal container that is meant for sharp items (sharps container). You can buy a sharps container from a pharmacy, or you can make one by using an empty hard plastic bottle with a cover.  Place any used dressings or infusion bags into a plastic bag. Throw that bag in the trash. Contact a health care provider if:  You have pain in your arm, ear, face, or teeth.  You have a fever or chills.  You have redness, swelling, or pain around the insertion site.  You have fluid or blood coming from the insertion site.  Your insertion site feels warm to the touch.  You have pus or a bad smell coming from the insertion site.  Your skin feels hard and raised around the insertion site. Get help right away if:  Your PICC is accidentally pulled all the way out. If this happens, cover the insertion site with a bandage or gauze dressing. Do not throw the PICC away. Your health care provider will need to check it.  Your PICC was tugged or pulled and has partially come out. Do not  push the PICC back in.  You cannot flush the PICC, it is hard to flush, or the PICC leaks around the insertion site when it is flushed.  You hear a "flushing" sound when the PICC is flushed.  You feel your  heart racing or skipping beats.  There is a hole or tear in the PICC.  You have swelling in the arm in which the PICC was inserted.  You have a red streak going up your arm from where the PICC was inserted. Summary  A peripherally inserted central catheter (PICC) is a long, thin, flexible tube (catheter) that is inserted into a vein in the upper arm.  The PICC is inserted using a sterile technique by a specially trained nurse or physician. Only a trained clinical professional should remove it.  Keep your PICC identification card with you at all times.  Avoid blood pressure checks on the arm in which the PICC is placed.  If cared for   properly, a PICC can remain in place for several months. Having a PICC can also allow a person to go home from the hospital sooner. This information is not intended to replace advice given to you by your health care provider. Make sure you discuss any questions you have with your health care provider. Document Revised: 06/22/2017 Document Reviewed: 08/12/2016 Elsevier Patient Education  2020 [employer]  

## 2020-01-05 NOTE — Progress Notes (Signed)
Spoke w/patient regarding CBC results--no transfusion indicated. She goes to [facility] to start BMT on 01/07/20.

## 2020-01-05 NOTE — Telephone Encounter (Signed)
Agreed -

## 2020-01-05 NOTE — Telephone Encounter (Signed)
[facility] called and cancelled her appt for August.  She will be in the [facility] at [facility] for bone marrow transplant.  Will call to RS when finished with treatment.

## 2020-01-07 ENCOUNTER — Encounter: Admit: 2020-01-07 | Discharge: 2020-01-07 | Payer: PRIVATE HEALTH INSURANCE

## 2020-01-07 ENCOUNTER — Ambulatory Visit: Admit: 2020-01-07 | Discharge: 2020-01-07 | Payer: PRIVATE HEALTH INSURANCE | Attending: Family | Primary: Family

## 2020-01-07 ENCOUNTER — Encounter
Admit: 2020-01-07 | Discharge: 2020-01-07 | Payer: PRIVATE HEALTH INSURANCE | Attending: Pharmacist Clinician (PhC)/ Clinical Pharmacy Specialist | Primary: Pharmacist Clinician (PhC)/ Clinical Pharmacy Specialist

## 2020-01-07 ENCOUNTER — Encounter
Admit: 2020-01-07 | Discharge: 2020-01-07 | Payer: PRIVATE HEALTH INSURANCE | Attending: Registered" | Primary: Registered"

## 2020-01-07 DIAGNOSIS — C9110 Chronic lymphocytic leukemia of B-cell type not having achieved remission: Secondary | ICD-10-CM | POA: Diagnosis not present

## 2020-01-07 DIAGNOSIS — D619 Aplastic anemia, unspecified: Secondary | ICD-10-CM | POA: Diagnosis not present

## 2020-01-07 DIAGNOSIS — F329 Major depressive disorder, single episode, unspecified: Secondary | ICD-10-CM | POA: Diagnosis not present

## 2020-01-07 DIAGNOSIS — Z7682 Awaiting organ transplant status: Secondary | ICD-10-CM | POA: Diagnosis not present

## 2020-01-07 DIAGNOSIS — F325 Major depressive disorder, single episode, in full remission: Secondary | ICD-10-CM | POA: Diagnosis not present

## 2020-01-07 DIAGNOSIS — E039 Hypothyroidism, unspecified: Secondary | ICD-10-CM | POA: Diagnosis not present

## 2020-01-07 DIAGNOSIS — Z01812 Encounter for preprocedural laboratory examination: Secondary | ICD-10-CM | POA: Diagnosis not present

## 2020-01-07 DIAGNOSIS — D801 Nonfamilial hypogammaglobulinemia: Secondary | ICD-10-CM | POA: Diagnosis not present

## 2020-01-07 DIAGNOSIS — F419 Anxiety disorder, unspecified: Secondary | ICD-10-CM | POA: Diagnosis not present

## 2020-01-07 DIAGNOSIS — D649 Anemia, unspecified: Secondary | ICD-10-CM | POA: Diagnosis not present

## 2020-01-07 DIAGNOSIS — B1910 Unspecified viral hepatitis B without hepatic coma: Secondary | ICD-10-CM | POA: Diagnosis not present

## 2020-01-07 DIAGNOSIS — Z20822 Contact with and (suspected) exposure to covid-19: Secondary | ICD-10-CM | POA: Diagnosis not present

## 2020-01-07 DIAGNOSIS — Z713 Dietary counseling and surveillance: Principal | ICD-10-CM

## 2020-01-08 ENCOUNTER — Ambulatory Visit: Admit: 2020-01-08 | Discharge: 2020-01-08 | Payer: PRIVATE HEALTH INSURANCE

## 2020-01-08 DIAGNOSIS — L600 Ingrowing nail: Secondary | ICD-10-CM | POA: Diagnosis not present

## 2020-01-08 DIAGNOSIS — D61810 Antineoplastic chemotherapy induced pancytopenia: Secondary | ICD-10-CM | POA: Diagnosis not present

## 2020-01-08 DIAGNOSIS — Z08 Encounter for follow-up examination after completed treatment for malignant neoplasm: Secondary | ICD-10-CM | POA: Diagnosis not present

## 2020-01-08 DIAGNOSIS — F329 Major depressive disorder, single episode, unspecified: Secondary | ICD-10-CM | POA: Diagnosis not present

## 2020-01-08 DIAGNOSIS — E039 Hypothyroidism, unspecified: Secondary | ICD-10-CM | POA: Diagnosis not present

## 2020-01-08 DIAGNOSIS — C9111 Chronic lymphocytic leukemia of B-cell type in remission: Secondary | ICD-10-CM | POA: Diagnosis not present

## 2020-01-08 DIAGNOSIS — R739 Hyperglycemia, unspecified: Secondary | ICD-10-CM | POA: Diagnosis not present

## 2020-01-08 DIAGNOSIS — F419 Anxiety disorder, unspecified: Secondary | ICD-10-CM | POA: Diagnosis not present

## 2020-01-08 DIAGNOSIS — C9110 Chronic lymphocytic leukemia of B-cell type not having achieved remission: Secondary | ICD-10-CM | POA: Diagnosis not present

## 2020-01-08 DIAGNOSIS — Z7682 Awaiting organ transplant status: Secondary | ICD-10-CM | POA: Diagnosis not present

## 2020-01-08 DIAGNOSIS — I10 Essential (primary) hypertension: Secondary | ICD-10-CM | POA: Diagnosis not present

## 2020-01-08 DIAGNOSIS — K30 Functional dyspepsia: Secondary | ICD-10-CM | POA: Diagnosis not present

## 2020-01-08 DIAGNOSIS — D801 Nonfamilial hypogammaglobulinemia: Secondary | ICD-10-CM | POA: Diagnosis not present

## 2020-01-08 DIAGNOSIS — Q998 Other specified chromosome abnormalities: Secondary | ICD-10-CM | POA: Diagnosis not present

## 2020-01-08 DIAGNOSIS — Z9221 Personal history of antineoplastic chemotherapy: Secondary | ICD-10-CM | POA: Diagnosis not present

## 2020-01-08 DIAGNOSIS — T451X5A Adverse effect of antineoplastic and immunosuppressive drugs, initial encounter: Secondary | ICD-10-CM | POA: Diagnosis not present

## 2020-01-09 ENCOUNTER — Ambulatory Visit
Admit: 2020-01-09 | Discharge: 2020-02-23 | Disposition: A | Discharge status: Home / Self Care | Payer: PRIVATE HEALTH INSURANCE

## 2020-01-09 ENCOUNTER — Ambulatory Visit
Admit: 2020-01-09 | Discharge: 2020-02-23 | Disposition: A | Discharge status: Home / Self Care | Payer: PRIVATE HEALTH INSURANCE | Attending: Pharmacist Clinician (PhC)/ Clinical Pharmacy Specialist

## 2020-01-09 NOTE — Unmapped (Signed)
Error, duplicate note

## 2020-01-12 MED ORDER — POSACONAZOLE 100 MG TABLET,DELAYED RELEASE
ORAL_TABLET | Freq: Every day | ORAL | 0 refills | 30.00000 days
Start: 2020-01-12 — End: 2020-01-12

## 2020-01-18 ENCOUNTER — Other Ambulatory Visit: Payer: Self-pay | Admitting: Oncology

## 2020-01-18 DIAGNOSIS — C9110 Chronic lymphocytic leukemia of B-cell type not having achieved remission: Secondary | ICD-10-CM

## 2020-01-21 DIAGNOSIS — C9110 Chronic lymphocytic leukemia of B-cell type not having achieved remission: Secondary | ICD-10-CM | POA: Diagnosis not present

## 2020-02-18 ENCOUNTER — Other Ambulatory Visit: Payer: Self-pay | Admitting: Psychiatry

## 2020-02-18 DIAGNOSIS — F3342 Major depressive disorder, recurrent, in full remission: Secondary | ICD-10-CM

## 2020-02-18 MED ORDER — PROMACTA 50 MG TABLET
ORAL_TABLET | Freq: Every day | ORAL | 0 refills | 30.00000 days
Start: 2020-02-18 — End: 2020-02-18

## 2020-02-18 MED ORDER — TACROLIMUS 0.5 MG CAPSULE, IMMEDIATE-RELEASE
ORAL_CAPSULE | Freq: Two times a day (BID) | ORAL | 5 refills | 30.00000 days | Status: CP
Start: 2020-02-18 — End: 2020-02-18
  Filled 2020-02-20: qty 180, 30d supply, fill #0

## 2020-02-18 MED ORDER — TACROLIMUS 0.5 MG CAPSULE, IMMEDIATE-RELEASE: 2 mg | capsule | Freq: Two times a day (BID) | 5 refills | 30 days | Status: AC

## 2020-02-19 DIAGNOSIS — D619 Aplastic anemia, unspecified: Principal | ICD-10-CM

## 2020-02-19 LAB — HEPATIC FUNCTION PANEL
ALBUMIN: 3.2 g/dL — ABNORMAL LOW (ref 3.4–5.0)
ALT (SGPT): 84 U/L — ABNORMAL HIGH (ref 10–49)
AST (SGOT): 29 U/L (ref <=34)
BILIRUBIN DIRECT: 0.3 mg/dL (ref 0.00–0.30)
PROTEIN TOTAL: 5.7 g/dL (ref 5.7–8.2)

## 2020-02-19 LAB — CBC W/ AUTO DIFF
BASOPHILS RELATIVE PERCENT: 0 %
EOSINOPHILS ABSOLUTE COUNT: 0 10*9/L (ref 0.0–0.4)
EOSINOPHILS RELATIVE PERCENT: 0.2 %
HEMATOCRIT: 22.7 % — ABNORMAL LOW (ref 36.0–46.0)
HEMOGLOBIN: 8.4 g/dL — ABNORMAL LOW (ref 12.0–16.0)
LYMPHOCYTES ABSOLUTE COUNT: 0.1 10*9/L — ABNORMAL LOW (ref 1.5–5.0)
LYMPHOCYTES RELATIVE PERCENT: 15.1 %
MEAN CORPUSCULAR HEMOGLOBIN CONC: 36.8 g/dL (ref 31.0–37.0)
MEAN CORPUSCULAR HEMOGLOBIN: 29.4 pg (ref 26.0–34.0)
MEAN CORPUSCULAR VOLUME: 79.8 fL — ABNORMAL LOW (ref 80.0–100.0)
MEAN PLATELET VOLUME: 8.6 fL (ref 7.0–10.0)
MONOCYTES ABSOLUTE COUNT: 0.1 10*9/L — ABNORMAL LOW (ref 0.2–0.8)
MONOCYTES RELATIVE PERCENT: 13.2 %
NEUTROPHILS ABSOLUTE COUNT: 0.4 10*9/L — CL (ref 2.0–7.5)
NEUTROPHILS RELATIVE PERCENT: 59.7 %
PLATELET COUNT: 21 10*9/L — ABNORMAL LOW (ref 150–440)
RED BLOOD CELL COUNT: 2.85 10*12/L — ABNORMAL LOW (ref 4.00–5.20)
RED CELL DISTRIBUTION WIDTH: 12.4 % (ref 12.0–15.0)
WBC ADJUSTED: 0.7 10*9/L — ABNORMAL LOW (ref 4.5–11.0)

## 2020-02-19 LAB — CALCIUM IONIZED VENOUS (MG/DL): Calcium.ionized:MCnc:Pt:Bld:Qn:: 5.18

## 2020-02-19 LAB — ALBUMIN: Albumin:MCnc:Pt:Ser/Plas:Qn:: 3.2 — ABNORMAL LOW

## 2020-02-19 LAB — INR: Coagulation tissue factor induced.INR:RelTime:Pt:PPP:Qn:Coag: 1.54

## 2020-02-19 LAB — APTT: Coagulation surface induced:Time:Pt:PPP:Qn:Coag: 42.7 — ABNORMAL HIGH

## 2020-02-19 LAB — IONIZED CALCIUM VENOUS: CALCIUM IONIZED VENOUS (MG/DL): 5.18 mg/dL (ref 4.40–5.40)

## 2020-02-19 LAB — MAGNESIUM: Magnesium:MCnc:Pt:Ser/Plas:Qn:: 1.5 — ABNORMAL LOW

## 2020-02-19 LAB — BASIC METABOLIC PANEL
ANION GAP: 6 mmol/L (ref 5–14)
BLOOD UREA NITROGEN: 16 mg/dL (ref 9–23)
BUN / CREAT RATIO: 21
CALCIUM: 9.3 mg/dL (ref 8.7–10.4)
CO2: 26 mmol/L (ref 20.0–31.0)
CREATININE: 0.75 mg/dL
EGFR CKD-EPI AA FEMALE: >90 mL/min/{1.73_m2} (ref >=60)
POTASSIUM: 3.8 mmol/L (ref 3.4–4.5)
SODIUM: 142 mmol/L (ref 135–145)

## 2020-02-19 LAB — PHOSPHORUS: Phosphate:MCnc:Pt:Ser/Plas:Qn:: 4.3

## 2020-02-19 LAB — LACTATE DEHYDROGENASE
LACTATE DEHYDROGENASE: 196 U/L (ref 120–246)
Lactate dehydrogenase:CCnc:Pt:Ser/Plas:Qn:Reaction: pyruvate to lactate: 196

## 2020-02-19 LAB — EOSINOPHILS ABSOLUTE COUNT: Eosinophils:NCnc:Pt:Bld:Qn:Automated count: 0

## 2020-02-19 LAB — EGFR CKD-EPI NON-AA FEMALE
Glomerular filtration rate/1.73 sq M.predicted.non black:ArVRat:Pt:Ser/Plas/Bld:Qn:Creatinine-based formula (CKD-EPI): 87

## 2020-02-19 MED ORDER — ELTROMBOPAG 50 MG TABLET
ORAL_TABLET | Freq: Every day | ORAL | 2 refills | 30.00000 days | Status: CP
Start: 2020-02-19 — End: ?
  Filled 2020-02-20: qty 30, 30d supply, fill #0

## 2020-02-19 MED ADMIN — eltrombopag (PROMACTA) tablet 50 mg: 50 mg | ORAL | @ 13:00:00

## 2020-02-19 MED ADMIN — famotidine (PEPCID) tablet 20 mg: 20 mg | ORAL | @ 13:00:00

## 2020-02-19 MED ADMIN — tacrolimus (PROGRAF) 1.5mg combo product: 1.5 mg | ORAL | @ 13:00:00

## 2020-02-19 MED ADMIN — ondansetron (ZOFRAN-ODT) disintegrating tablet 8 mg: 8 mg | ORAL | @ 13:00:00

## 2020-02-19 MED ADMIN — valACYclovir (VALTREX) tablet 500 mg: 500 mg | ORAL | @ 13:00:00

## 2020-02-19 MED ADMIN — loratadine (CLARITIN) tablet 10 mg: 10 mg | ORAL | @ 13:00:00

## 2020-02-19 NOTE — Unmapped (Signed)
[facility] Specialty Medication Onboarding    Specialty Medication: Tacrolimus 0.5mg  capsules  Prior Authorization: Not Required   Financial Assistance: No - copay  <$25  Final Copay/Day Supply: $0 / 30 days    Insurance Restrictions: Yes - max 1 month supply     Notes to Pharmacist: Urgent High copay referral entered for Promacta as well for patient.     The triage team has completed the benefits investigation and has determined that the patient is able to fill this medication at [facility]. Please contact the patient to complete the onboarding or follow up with the prescribing physician as needed.

## 2020-02-19 NOTE — Unmapped (Signed)
[facility]   Patient Onboarding/Medication Counseling    Susan Kane is a 59 y.o. female with aplastic anemia who I am counseling today on continuation of therapy.  I am speaking to the patient.    Was a Nurse, learning disability used for this call? No    Verified patient's date of birth / HIPAA.    Specialty medication(s) to be sent: Transplant: tacrolimus 0.5 mgmg    Non-specialty medications/supplies to be sent: none    Medications not needed at this time: none     Prograf (tacrolimus)    Medication & Administration     Dosage: Take 3 capsules (1.5 mg total) by mouth two (2) times a day.     Administration:   ??? May take with or without food  ??? Take 12 hours apart    Adherence/Missed dose instructions:  ??? Take a missed dose as soon as you think about it.  ??? If it is close to the time for your next dose, skip the missed dose and go back to your normal time.  ??? Do not take 2 doses at the same time or extra doses.    Goals of Therapy     ??? To prevent organ rejection    Side Effects & Monitoring Parameters     ??? Common side effects  ??? Dizziness  ??? Fatigue  ??? Headache  ??? Stuffy nose or sore throat  ??? Nausea, vomiting, stomach pain, diarrhea, constipation  ??? Heartburn  ??? Back or joint pain  ??? Increased risk of infection    ??? The following side effects should be reported to the provider:  ??? Allergic reaction  ??? Kidney issues (change in quantity or urine passed, blood in urine, or weight gain)  ??? High blood pressure (dizziness, change in eyesight, headache)  ??? Electrolyte issues (change in mood, confusion, muscle pain, or weakness)  ??? Abnormal breathing  ??? Shakiness  ??? Unexplained bleeding or bruising (gums bleeding, blood in urine, nosebleeds, any abnormal bleeding)  ??? Signs of infection  ??? Skin changes (sores, paleness, new or changed bumps or moles)    ??? Monitoring Parameters  ??? Renal function  ??? Liver function  ??? Glucose levels  ??? Blood pressure  ??? Tacrolimus trough levels  ??? Cardiac monitoring (for QT prolongation)      Contraindications, Warnings, & Precautions     ??? Black Box Warning: Infections - immunosuppressant agents increase the risk of infection that may lead to hospitalization or death  ??? Black Box Warning: Malignancy - immunosuppressant agents may be associated with the development of malignancies that may lead to hospitalization or death  ??? Limit or avoid sun and ultraviolet light exposure, use appropriate sun protection  ??? Myocardial hypertrophy -avoid use in patients with congenital long QT syndrome  ??? Diabetes mellitus - the risk for new-onset diabetes and insulin-dependent post-transplant diabetes mellitus is increased with tacrolimus use after transplantation  ??? GI perforation  ??? Hyperkalemia  ??? Hypertension  ??? Nephrotoxicity  ??? Neurotoxicity  ??? This is a narrow therapeutic index drug. Do not switch manufacturers without first talking to the provider.    Drug/Food Interactions     ??? Medication list reviewed in Epic. The patient was instructed to inform the care team before taking any new medications or supplements. 1. Posaconazole may increase the serum concentration of Tacrolimus - patient is being monitored and dose adjusted as needed.  2. alprazolam and amlodipine may increase the serum concentration of Tacrolimus -  tac trough levels being monitored.   ??? Avoid alcohol  ??? Avoid grapefruit or grapefruit juice  ??? Avoid live vaccines    Storage, Handling Precautions, & Disposal     ??? Store at room temperature  ??? Keep away from children and pets    Current Medications (including OTC/herbals), Comorbidities and Allergies     No current facility-administered medications for this visit.     Current Outpatient Medications   Medication Sig Dispense Refill   ??? eltrombopag (PROMACTA) 50 MG tablet Take 1 tablet (50 mg total) by mouth daily. Administer on an empty stomach, 1 hour before or 2 hours after a meal. 30 tablet 2   ??? tacrolimus (PROGRAF) 0.5 MG capsule Take 3 capsules (1.5 mg total) by mouth two (2) times a day. 180 capsule 5     Facility-Administered Medications Ordered in Other Visits   Medication Dose Route Frequency Provider Last Rate Last Admin   ??? acetaminophen (TYLENOL) tablet 650 mg  650 mg Oral Q4H PRN Artelia Laroche, MD       ??? ALPRAZolam Prudy Feeler) tablet 0.25 mg  0.25 mg Oral BID PRN Lou Miner Zanter, ANP   0.25 mg at 02/12/20 0108   ??? aluminum-magnesium hydroxide-simethicone (MAALOX MAX) 80-80-8 mg/mL oral suspension  30 mL Oral Q4H PRN Tomasa Hosteller, PA   30 mL at 01/26/20 1801   ??? amLODIPine (NORVASC) tablet 5 mg  5 mg Oral Daily Doristine Locks, FNP   5 mg at 02/19/20 1610   ??? calcium carbonate (TUMS) chewable tablet 200 mg of elem calcium  200 mg of elem calcium Oral TID PRN Janyth Contes, FNP   200 mg of elem calcium at 02/06/20 1631   ??? cefdinir (OMNICEF) capsule 300 mg  300 mg Oral Q12H [facility] Roberto Scales, MD   300 mg at 02/19/20 0915   ??? CETAPHIL topical cleanser 1 application  1 application Topical 4x Daily PRN Tomasa Hosteller, PA       ??? diphenhydrAMINE (BENADRYL) injection 25 mg  25 mg Intravenous Q4H PRN Artelia Laroche, MD       ??? diphenhydrAMINE (BENADRYL) injection 25 mg  25 mg Intravenous Q4H PRN Artelia Laroche, MD       ??? diphenhydrAMINE (BENADRYL) injection 50 mg  50 mg Intravenous Once PRN Artelia Laroche, MD       ??? diphenhydrAMINE (BENADRYL) injection 50 mg  50 mg Intravenous Once PRN Artelia Laroche, MD       ??? docusate sodium (COLACE) capsule 200 mg  200 mg Oral BID PRN Lou Miner Zanter, ANP   200 mg at 01/12/20 1104   ??? eltrombopag (PROMACTA) tablet 50 mg  50 mg Oral Daily Roberto Scales, MD   50 mg at 02/19/20 9604   ??? emollient combination no.92 (LUBRIDERM) lotion 1 application  1 application Topical 4x Daily PRN Tomasa Hosteller, PA   1 application at 01/25/20 0330   ??? EPINEPHrine (EPIPEN) injection 0.3 mg  0.3 mg Intramuscular Daily PRN Artelia Laroche, MD ??? EPINEPHrine [facility]) injection 0.3 mg  0.3 mg Intramuscular Daily PRN Artelia Laroche, MD       ??? famotidine (PEPCID) tablet 20 mg  20 mg Oral BID Tomasa Hosteller, PA   20 mg at 02/19/20 0916   ??? famotidine (PF) (PEPCID) injection 20 mg  20 mg Intravenous Q4H PRN Artelia Laroche, MD       ??? famotidine (PF) (PEPCID)  injection 20 mg  20 mg Intravenous Q4H PRN Artelia Laroche, MD       ??? folic acid (FOLVITE) tablet 1 mg  1 mg Oral At bedtime Libby Maw, ANP   1 mg at 02/18/20 2046   ??? guaiFENesin (ROBITUSSIN) oral syrup  200 mg Oral Q4H PRN Karie Chimera, AGNP       ??? heparin, porcine (PF) 100 unit/mL injection 2 mL  2 mL Intravenous Q MWF Faith Elie Confer, PA   2 mL at 02/18/20 1610   ??? IP OKAY TO TREAT   Other Continuous PRN Libby Maw, ANP       ??? levothyroxine (SYNTHROID) tablet 100 mcg  100 mcg Oral Daily Lou Miner Zanter, ANP   100 mcg at 02/19/20 9604   ??? lidocaine (XYLOCAINE) 2% viscous mucosal solution  10 mL Mouth Q2H PRN Faith Elie Confer, PA       ??? lithium (LITHOBID) ER tablet 300 mg  300 mg Oral At bedtime Libby Maw, ANP   300 mg at 02/18/20 2046   ??? loperamide (IMODIUM) capsule 2 mg  2 mg Oral Once PRN Tomasa Hosteller, PA       ??? loperamide (IMODIUM) capsule 2 mg  2 mg Oral Q3H PRN Faith Elie Confer, PA       ??? loratadine (CLARITIN) tablet 10 mg  10 mg Oral Daily Margretta Ditty, MD   10 mg at 02/19/20 0916   ??? LORazepam (ATIVAN) injection 1.5 mg  1.5 mg Intravenous Q4H PRN Roberto Scales, MD   1.5 mg at 02/10/20 1410   ??? magnesium oxide (MAG-OX) tablet 1,200 mg  1,200 mg Oral Q6H PRN Tomasa Hosteller, PA       ??? magnesium oxide (MAG-OX) tablet 800 mg  800 mg Oral Q6H PRN Tomasa Hosteller, PA   800 mg at 02/19/20 0509   ??? meperidine (DEMEROL) injection 25 mg  25 mg Intravenous Q30 Min PRN Artelia Laroche, MD       ??? meperidine (DEMEROL) injection 25 mg  25 mg Intravenous Q30 Min PRN Artelia Laroche, MD       ??? methylPREDNISolone sodium succinate (PF) (Solu-MEDROL) injection 125 mg  125 mg Intravenous Q4H PRN Artelia Laroche, MD       ??? methylPREDNISolone sodium succinate (PF) (Solu-MEDROL) injection 125 mg  125 mg Intravenous Q4H PRN Artelia Laroche, MD       ??? mucositis mixture (with lidocaine)  10 mL Mucous Membrane Q2H PRN Tomasa Hosteller, PA   10 mL at 02/04/20 1039   ??? OLANZapine zydis (ZyPREXA) disintegrating tablet 5 mg  5 mg Oral Nightly Hillary Bow, MD   5 mg at 02/18/20 2046   ??? ondansetron (ZOFRAN-ODT) disintegrating tablet 8 mg  8 mg Oral Q8H PRN Roberto Scales, MD   8 mg at 02/19/20 0915   ??? polyethylene glycol (MIRALAX) packet 17 g  17 g Oral BID PRN Roberto Scales, MD   17 g at 02/19/20 1213   ??? posaconazole (NOXAFIL) delayed released tablet 300 mg  300 mg Oral Daily Lou Miner Zanter, ANP   300 mg at 02/19/20 0915   ??? potassium chloride (KLOR-CON) CR tablet 40 mEq  40 mEq Oral Q6H PRN Tomasa Hosteller, PA   40 mEq at 02/07/20 0506   ??? potassium chloride (KLOR-CON) CR tablet 60 mEq  60 mEq Oral Q6H PRN Faith Elie Confer, PA  60 mEq at 01/15/20 0444   ??? prochlorperazine (COMPAZINE) tablet 10 mg  10 mg Oral Q6H PRN Tomasa Hosteller, PA   10 mg at 02/17/20 1413    Or   ??? prochlorperazine (COMPAZINE) injection 10 mg  10 mg Intravenous Q6H PRN Tomasa Hosteller, PA   10 mg at 01/13/20 0031   ??? senna (SENOKOT) tablet 2 tablet  2 tablet Oral Nightly Roberto Scales, MD   2 tablet at 02/18/20 2045   ??? simethicone (MYLICON) chewable tablet 80 mg  80 mg Oral Q6H PRN Hillary Bow, MD   80 mg at 02/04/20 0004   ??? sodium chloride (NS) 0.9 % infusion  20 mL/hr Intravenous Continuous Tomasa Hosteller, PA 20 mL/hr at 01/09/20 1205 20 mL/hr at 01/09/20 1205   ??? sodium chloride (NS) 0.9 % infusion  20 mL/hr Intravenous Continuous PRN Artelia Laroche, MD       ??? sodium chloride (NS) 0.9 % infusion  20 mL/hr Intravenous Continuous PRN Artelia Laroche, MD       ??? sodium chloride 0.9% (NS) bolus 1,000 mL  1,000 mL Intravenous Daily PRN Artelia Laroche, MD       ??? sodium chloride 0.9% (NS) bolus 1,000 mL  1,000 mL Intravenous Daily PRN Artelia Laroche, MD       ??? tacrolimus (PROGRAF) 1.5mg  combo product  1.5 mg Oral BID Roberto Scales, MD   1.5 mg at 02/19/20 1610   ??? tbo-filgrastim (GRANIX) injection 300 mcg  300 mcg Subcutaneous Q24H Artelia Laroche, MD   300 mcg at 02/18/20 2044   ??? traMADoL (ULTRAM) tablet 50 mg  50 mg Oral Q6H PRN Janyth Contes, FNP   50 mg at 01/22/20 0501   ??? valACYclovir (VALTREX) tablet 500 mg  500 mg Oral Daily Lou Miner Zanter, ANP   500 mg at 02/19/20 0915   ??? vortioxetine (TRINTELLIX) tablet 10 mg  10 mg Oral At bedtime Libby Maw, ANP   10 mg at 02/18/20 2048   ??? zolpidem (AMBIEN) tablet 5 mg  5 mg Oral Nightly Lou Miner Zanter, ANP   5 mg at 02/18/20 2346       No Known Allergies    Patient Active Problem List   Diagnosis   ??? Chronic lymphocytic leukemia (CLL), B-cell (CMS-HCC)   ??? Hypogammaglobulinemia (CMS-HCC)   ??? Chronic lymphocytic leukemia of B-cell type not having achieved remission (CMS-HCC)   ??? Aplastic anemia (CMS-HCC)   ??? 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 Lower abdominal pain   ??? Drug-induced nausea and vomiting   ??? Stem cell transplant candidate       Reviewed and up to date in Epic.    Appropriateness of Therapy     Is medication and dose appropriate based on diagnosis? Yes    Prescription has been clinically reviewed: Yes    Baseline Quality of Life Assessment      How many days over the past month did your aplastic anemia  keep you from your normal activities? For example, brushing your teeth or getting up in the morning. 0    Financial Information     Medication Assistance provided: None Required    Anticipated copay of $0 / 30 days reviewed with patient. Verified delivery address. Delivery Information     Scheduled delivery date: 02/20/20    Expected start date: continuation of therapy    Medication will be delivered via  [facility] to the temporary address in [place].  This shipment will not require a signature.      Explained the services we provide at [facility] and that each month we would call to set up refills.  Stressed importance of returning phone calls so that we could ensure they receive their medications in time each month.  Informed patient that we should be setting up refills 7-10 days prior to when they will run out of medication.  A pharmacist will reach out to perform a clinical assessment periodically.  Informed patient that a welcome packet and a drug information handout will be sent.      Patient verbalized understanding of the above information as well as how to contact the pharmacy at [phone] option 4 with any questions/concerns.  The pharmacy is open Monday through Friday 8:30am-4:30pm.  A pharmacist is available 24/7 via pager to answer any clinical questions they may have.    Patient Specific Needs     - Does the patient have any physical, cognitive, or cultural barriers? No    - Patient prefers to have medications discussed with  Patient     - Is the patient or caregiver able to read and understand education materials at a high school level or above? Yes    - Patient's primary language is  English     - Is the patient high risk? No     - Does the patient require a Care Management Plan? No     - Does the patient require physician intervention or other additional services (i.e. nutrition, smoking cessation, social work)? No      Illana Nolting A Shari [facility] Specialty Pharmacist

## 2020-02-19 NOTE — Unmapped (Signed)
Pt day +35 ALLO SCT. Vital Signs: Pt remained afebrile during shift, all other VSS. Pain: Pt denies pain. Nausea/ Vomiting: Pt endorses nausea, PRN Zofran given. Diarrhea: pt denies. Blood Products/Replacements: Pt received  replacements: Magnesium 800mg . Falls/safety: Pt remained free from falls and injury this shift. Bed in lowest position. Call bell within reach. Other: Pt has no complaints at this time. Will continue to monitor patient and POC    Problem: Adult Inpatient Plan of Care  Goal: Plan of Care Review  Outcome: Ongoing - Unchanged  Goal: Patient-Specific Goal (Individualization)  Outcome: Ongoing - Unchanged  Goal: Absence of Hospital-Acquired Illness or Injury  Outcome: Ongoing - Unchanged  Goal: Optimal Comfort and Wellbeing  Outcome: Ongoing - Unchanged  Goal: Readiness for Transition of Care  Outcome: Ongoing - Unchanged  Goal: Rounds/Family Conference  Outcome: Ongoing - Unchanged     Problem: Infection  Goal: Infection Symptom Resolution  Outcome: Ongoing - Unchanged     Problem: Fatigue (Stem Cell/Bone Marrow Transplant)  Goal: Energy Level Supports Daily Activity  Outcome: Ongoing - Unchanged     Problem: Hematologic Alteration (Stem Cell/Bone Marrow Transplant)  Goal: Blood Counts Within Acceptable Range  Outcome: Ongoing - Unchanged     Problem: Nausea and Vomiting (Stem Cell/Bone Marrow Transplant)  Goal: Nausea and Vomiting Symptom Relief  Outcome: Ongoing - Unchanged     Problem: Nutrition Intake Altered (Stem Cell/Bone Marrow Transplant)  Goal: Optimal Nutrition Intake  Outcome: Ongoing - Unchanged

## 2020-02-19 NOTE — Unmapped (Signed)
[facility] Specialty Medication Onboarding    Specialty Medication: Promacta  Prior Authorization: Not Required   Financial Assistance: Yes - copay card approved as secondary   Final Copay/Day Supply: $0 / 30    Insurance Restrictions: None     Notes to Pharmacist: N/A    The triage team has completed the benefits investigation and has determined that the patient is able to fill this medication at [facility]. Please contact the patient to complete the onboarding or follow up with the prescribing physician as needed.

## 2020-02-19 NOTE — Unmapped (Signed)
BMT-CT Inpatient Progress Note    Patient Name: Susan Kane  MRN: [id number]  Encounter Date: 02/19/20     Referring Physician: Dr. Jillyn Hidden Nida Boatman) [name]  Primary Care Provider: Thora Lance, MD  BMT Attending MD: Dr. Oswald Hillock    Disease: CLL  Current disease status: SD (stable disease)  Type of Transplant: RIC MUD Allo  Graft Source: Cryopreserved PBSCs  Transplant Day: D+36 (02/19/20)    Study Participant: BMT CTN 1704: Composite Health Assessment Risk Model for Older Adults: Applying Pre-transplant Comorbidity, Geriatric Assessment and Biomarkers to Predict Non-Relapse Mortality After Allogeneic Transplant (CHARM).    Susan Kane is a 59 y.o. 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     Interval History:  No acute events overnight. Her counts continue to rise appropriately. Reports feeling well and denies any new acute issues. Denies symptoms of fevers, chills, dyspnea, cough, chest pain, nausea, vomiting, abdominal pain, diarrhea.    Review of Systems:  A comprehensive review of systems was negative except for pertinent positives noted above.     Reviewed and updated past medical, surgical, social, and family history as appropriate.     Objective:  Temp:  [36.4 ??C (97.5 ??F)-37.1 ??C (98.8 ??F)] 36.9 ??C (98.4 ??F)  Heart Rate:  [81-94] 87  Resp:  [15-18] 17  BP: (108-121)/(59-71) 110/62  MAP (mmHg):  [77-86] 80  SpO2:  [95 %-100 %] 99 %   Vitals:    02/17/20 1714 02/18/20 2057   Weight: 55.7 kg (122 lb 12.8 oz) 56.4 kg (124 lb 4.8 oz)      80, Normal activity with effort; some signs or symptoms of disease (ECOG equivalent 1)    Physical Exam:  General : No acute distress noted.   Central venous access: Line clean, dry, intact. No erythema or drainage noted.   ENT: Moist mucous membranes. Oropharynx without lesions, erythema or exudate.   Cardiovascular: Regular rate and rhythm. Normal S1 and S2, without any murmur, rub, or gallop.  Lungs: Clear to auscultation. Normal work of breathing.  Skin: Warm, dry, intact. No rash noted.   GI: Normoactive bowel sounds, abdomen soft, non-tender   Extremeties: No edema.   Neurologic: Alert and oriented to person, place, and time. Normal strength and sensation throughout     Test Results: I personally reviewed these labs.  WBC   Date Value Ref Range Status   02/19/2020 0.7 (L) 4.5 - 11.0 10*9/L Final   02/07/2020 <0.1 (LL) 4.5 - 11.0 10*9/L Final     Comment:     WBC count insufficient for precise differential.      HGB   Date Value Ref Range Status   02/19/2020 8.4 (L) 12.0 - 16.0 g/dL Final     HCT   Date Value Ref Range Status   02/19/2020 22.7 (L) 36.0 - 46.0 % Final     Platelet   Date Value Ref Range Status   02/19/2020 21 (L) 150 - 440 10*9/L Final     Absolute Neutrophils   Date Value Ref Range Status   02/19/2020 0.4 (LL) 2.0 - 7.5 10*9/L Final     Absolute Eosinophils   Date Value Ref Range Status   02/19/2020 0.0 0.0 - 0.4 10*9/L Final     Sodium   Date Value Ref Range Status   02/19/2020 142 135 - 145 mmol/L Final  Potassium   Date Value Ref Range Status   02/19/2020 3.8 3.4 - 4.5 mmol/L Final     Chloride   Date Value Ref Range Status   02/19/2020 110 (H) 98 - 107 mmol/L Final     CO2   Date Value Ref Range Status   02/19/2020 26.0 20.0 - 31.0 mmol/L Final     BUN   Date Value Ref Range Status   02/19/2020 16 9 - 23 mg/dL Final     Creatinine   Date Value Ref Range Status   02/19/2020 0.75 0.60 - 0.80 mg/dL Final     Glucose   Date Value Ref Range Status   02/19/2020 129 70 - 179 mg/dL Final     Calcium   Date Value Ref Range Status   02/19/2020 9.3 8.7 - 10.4 mg/dL Final     Magnesium   Date Value Ref Range Status   02/19/2020 1.5 (L) 1.6 - 2.6 mg/dL Final     Total Bilirubin   Date Value Ref Range Status   02/19/2020 0.7 0.3 - 1.2 mg/dL Final     Total Protein   Date Value Ref Range Status   02/19/2020 5.7 5.7 - 8.2 g/dL Final Albumin   Date Value Ref Range Status   02/19/2020 3.2 (L) 3.4 - 5.0 g/dL Final     ALT   Date Value Ref Range Status   02/19/2020 84 (H) 10 - 49 U/L Final     AST   Date Value Ref Range Status   02/19/2020 29 <=34 U/L Final     Alkaline Phosphatase   Date Value Ref Range Status   02/19/2020 125 (H) 46 - 116 U/L Final     LDH   Date Value Ref Range Status   02/19/2020 196 120 - 246 U/L Final      Assessment/Plan:    BMT: SD (stable disease)  HCT-CI (age adjusted) 5 (active CLL, psych, age adjusted)   Conditioning:  1. Rituximab 375 mg/m2 on day -13 and 1000 mg/m2 on D-6, +1, and +8  2. Bendamustine 130 mg/m2 IV daily over 10 minutes on D-5 through -3  3. Fludarabine 30 mg/m2 IV daily over 30 minutes on D-5 through -3  ??  Donor: 10/10, ABO ), CMV negative  Engraftment: Granix starting D+12 through neutrophil recovery (as defined as ANC 1.0 x 2 days or 3.0 x 1 day)  - Date of last granix injection: TBD  - D22 no engraftment,  - Viral studies showed weakly positive EBV, CMV, HHV-6, plan to monitor for now  - BMBx results showed hypocellular marrow without evidence of hematopiesis. Chimerism studies are still pending  - Repeat viral studies on 7/26  - Started eltrombopag 50 mg daily on 7/27    GvHD prophylaxis:   1. Tacrolimus 0.045 mg/kg PO BID starting D-3 followed by 0.03 mg/kg PO BID starting on D-1 with a goal of 5-10 ng/mL  ????** Current dose 1/0.5mg  BID  2. Methotrexate 5 mg/m2 on D+1, +3, +6 and +11  3. rATG 1 mg/kg on D-2 and -1 included  ??  Heme:   - Transfusion criteria: Transfuse 1 unit of PRBCs for Hgb<7 and 1 unit of platelets for Plt <10K or bleeding. No history of transfusion reactions.     ID:   Prophylaxis:  - Antiviral: Continue Valtrex 500 mg po daily on admit  - Antifungal: Posaconazole 300 mg daily  - Antibacterial: Will use cefdinir 300 BID for prophy  ????** history  of nausea with levofloxacin when given with CNI and higher dose of Trintellix, so unclear which agent caused nausea. Patient ok with re-challenging with levofloxacin with plan to switch to cefdinir if she develops significant nausea while on levofloxacin  - PJP: Bactrim DS once daily MWF upon platelet engraftment.  ??  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 Repeat CMV IgG on 01/07/2020 was once again negative.  ??  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   ??  CINV:   - Anti-emetics per BMT protocol.   - Zofran works well for her, scheduled ODT q 8 hr (6/22)  - Nightly zyprexa (added 7/2)  ??  Diarrhea: Resolved.     Constipation: Changing miralax to PRN, reducing senna to 1 tab at night.    Renal:   Non-oliguric AKI: Cr increased to 1.16 from baseline of ~ 0.7. Likely pre-renal in the setting of poor PO intake. No history of urinary retention/obstruction.  - Follow up tacro levels  - s/p 1u pRBCs and NS, assess response  - Follow up urine studies  - Strict I&O, avoid nephrotoxins  ??  FEN:  Electrolyte replacement per protocol  - Continue folic acid.  - Will monitor PO fluid intake and administer IVF as necessary  ??  Endocrine:  Hypothyroid:??  - Continue Synthroid daily.     Hepatic:   - VOD prophylaxis not warranted as non-myeloablative regimen.     Hepatocellular liver injury pattern:  - Mild increase in AST, ALT, and AlkPhos, likely secondary to posaconazole  - Follow up repeat viral studies on Monday     CV:   HTN: Improved on 5mg  amlodipine.     Pulm:   AHRF: First noted on 7/22. Intermittent O2 requirement with exam findings concerning for LLL crackles. No signs of volume overload on exam.  - Chest-X ray x2 clear  - Assess O2 requirement with walk test  - Consider CT Chest w/o contrast if persistent    Neuro/Pain:   - Oxycodone 5 mg po prn. Psych: Has a home psychiatrist.   Depression/Anxiety:   - Continue Xanax 0.25 mg BID PRN for anxiety  - Continue Lithium 300mg  nightly  - Continue Trinellix 10mg  nightly (patient will bring her home supply).  - Psych consult PRN.   ??  Insomnia:  - Ambien 5mg  nightly.   ??  Caregiver/Lodging:  - Husband [name] will be primary caregiver  - Plan to stay in local rental post transplant??    Plan Summary:  - Continue G-CSF and eltrombopag  - Encourage PO intake and physical activity      Jos?? C. Mart??nez, MD, PhD  Hematology and Oncology Fellow  [facility]

## 2020-02-19 NOTE — Unmapped (Signed)
[facility]   Patient Onboarding/Medication Counseling    Susan Kane is a 59 y.o. female with aplastic anemia who I am counseling today on initiation of therapy.  I am speaking to the patient.    Was a Nurse, learning disability used for this call? No    Verified patient's date of birth / HIPAA.    Specialty medication(s) to be sent: Hematology/Oncology: Promacta    Non-specialty medications/supplies to be sent: none    Medications not needed at this time: none     Promacta (eltrombopag)    Medication & Administration     Dosage: Take 1 tablet (50 mg total) by mouth daily. Administer on an empty stomach, 1 hour before or 2 hours after a meal.    Administration:     All products:  ??? Take on an empty stomach. Take 1 hour before or 2 hours after meals.  ??? Take this drug at least 2 hours before or 4 hours after any antacids; dairy products or other foods with calcium in them; or products that have calcium, iron, aluminum, magnesium, selenium, or zinc.  Tablets:  ??? Swallow whole. Do not chew, break, or crush.  ??? Do not mix with food or drinks.  Powder for suspension:  ??? Before using, be sure you know how to mix and measure the dose of this drug. Talk with the doctor or pharmacist if you are not sure.  ??? This drug comes with oral dosing syringes. Each syringe is for one use only. Throw syringe away after use. Do not use the same syringe more than one time.  ??? Mix powder with water only as you have been told.  ??? Do not use hot water to mix this drug.  ??? Take your dose within 30 minutes after mixing. Throw away any part not used within 30 minutes of mixing.  Adherence/Missed dose instructions: If you miss a dose, skip the missed dose and go back to your regular time.  Do not take 2 doses at the same time.  Do not take extra doses.    Goals of Therapy     ??? To raise platelet counts  ??? To treat immune thrombocytopenia (ITP)  ??? To treat aplastic anemia    Side Effects & Monitoring Parameters     Commonly reported side effects  ??? Fatigue, loss of strength and energy  ??? Nausea, vomiting  ??? Diarrhea, abdominal pain  ??? Common cold symptoms, cough, throat and nose irritation  ??? Change in taste, loss of appetite, weight loss  ??? Tooth pain  ??? Joint pain, muscle pain/spasms  ??? Headache, dizziness   ??? Skin discoloration    The following side effects should be reported to the provider:  ??? Signs of infection (fever >100.4, chills, mouth sores, sputum production)  ??? Signs of bleeding (vomiting or coughing up blood, blood that looks like coffee grounds, blood in the urine or black, red tarry stools, bruising that gets bigger without reason, any persistent or severe bleeding, impaired wound healing)  ??? Signs of blood clots (numbness or weakness on one side of the body, pain, redness, tenderness, warmth, or swelling in the arms or legs, change in color of an arm of leg, chest pain, shortness of breath, fast heartbeat)  ??? Signs of liver problems (dark urine, abdominal pain, light-colored stools, vomiting, yellow skin or eyes)  ??? Signs of cerebrovascular disease (change in strength on one side is greater than the other, trouble speaking or thinking, change in  balance or vision changes)  ??? Signs of UTI (blood in the urine, burning or painful urination, passing a lot of urine, fever, lower abdominal or pelvic pain)  ??? Vision changes, eye pain, severe eye irritation  ??? Confusion   ??? Swelling of arms or legs  ??? Abdominal swelling   ??? Signs of anaphylaxis (wheezing, chest tightness, swelling of face, lips, tongue or throat)    Monitoring Parameters:   ??? Chronic ITP, chronic hepatitis C-associated thrombocytopenia, and severe refractory aplastic anemia.  o Monitor liver function tests, including ALT, AST, and bilirubin (baseline, every 2 weeks during dosage titration, then monthly after a stable dose is achieved; evaluate abnormal liver function tests within 3 to 5 days; monitor weekly until abnormalities resolve, stabilize, or return to baseline or if re-treating [not recommended] after therapy interruption for hepatotoxicity); bilirubin fractionation (for elevated bilirubin)  ??? Thrombocytopenia due to [facility] and chronic ITP:   o CBC with differential and platelet count (weekly at initiation and during dosage titration, then monthly when stable; after cessation, monitor weekly for ?4 weeks; when switching between the oral suspension and tablet, monitor platelet counts weekly for 2 weeks, then monthly when stable).  ??? Severe aplastic anemia, first-line treatment:   o ALT, AST, and bilirubin prior to eltrombopag initiation, every other day while hospitalized for antithymocyte globulin (equine) therapy, and then every 2 weeks during treatment; CBC with differential and platelets (regularly throughout therapy)    ??? Severe aplastic anemia, refractory:   o CBC with differential and platelets (regularly throughout therapy)  ??? Signs and symptoms of cataracts; obtain ophthalmic exam at baseline and during therapy.   ??? Signs/symptoms of thromboembolism.   ??? Adherence.    Contraindications, Warnings, & Precautions   ??? [place] Boxed Warning]: Eltrombopag may increase the risk of severe and potentially life-threatening hepatotoxicity. Monitor hepatic function and discontinue dosing as recommended.   ??? [[place] Boxed Warning]: May increase risk of hepatic decompensation when used in combination with interferon and ribavirin in patients with chronic hepatitis C.   ??? Cataract formation: Cataract formation or worsening was observed in clinical trials.   ??? Thromboembolism: Thromboembolism (venous or arterial) may occur with excessive increases in platelet levels. Use with caution in patients with known risk factors for thromboembolism.    ??? Hepatic impairment: Clearance may be reduced in patients with hepatic impairment; use with caution; reduced starting doses are recommended in patients with ITP (except children 1 to 5 years) and severe aplastic anemia who have hepatic impairment (no initial dose reductions are necessary in patients with chronic hepatitis C-related thrombocytopenia).  ??? Myelodysplastic syndromes: Eltrombopag is not indicated for the treatment of myelodysplastic syndromes (MDS).  ??? Renal impairment: Use with caution with renal impairment (any degree) and monitor closely; initial dosage adjustment is not necessary.  ??? Asian ethnicity (eg, [place], [place], [place], [place], and [place]): May have greater drug exposure (compared to non-Asians); therapy should be initiated with lower starting doses in ITP and severe aplastic anemia patients.  ??? Appropriate use: Do not use to normalize platelet counts.   o ITP: Indicated only when the degree of thrombocytopenia and clinical conditions increase the risk for bleeding in patients with chronic ITP; use the lowest dose necessary to achieve and maintain platelet count ?50,000/mm3. Discontinue if platelet count does not respond to a level to avoid clinically important bleeding after 4 weeks at the maximum recommended dose.   o Chronic hepatitis C-associated thrombocytopenia: Use only when thrombocytopenia prevents the initiation and maintenance of  interferon-based therapy; discontinue if antiviral therapy is discontinued. Safety and efficacy have not been established when combined with direct acting antiviral medications approved for chronic hepatitis C genotype 1 infection therapy.   o Severe refractory aplastic anemia: Use the lowest dose to achieve and maintain hematologic response. Discontinue if no hematologic response has occurred after 16 weeks of therapy, excessive platelet count responses or important liver test abnormalities. Consider discontinuation if new cytogenetic abnormalities are observed.  ??? Reproductive considerations: Females of reproductive potential should use effective contraception (methods that result in <1% pregnancy rates) during eltrombopag therapy and for at least 7 days after the last eltrombopag dose.  ??? Breast-feeding considerations: It is not known if eltrombopag is present in breast milk. Due to the potential for serious adverse effects in the breastfed infant, breastfeeding is not recommended by the manufacturer.  Drug/Food Interactions     ??? Medication list reviewed in Epic. The patient was instructed to inform the care team before taking any new medications or supplements. magnesium, tums, calcium - Polyvalent Cation Containing Products may decrease the serum concentration of Eltrombopag.  dminister eltrombopag at least 2 hours before or 4 hours after oral administration of any polyvalent cation containing product.   ??? Calcium-rich (>50 mg) foods decrease the absorption of eltrombopag. Take on an empty stomach or with a low-calcium (?50 mg) meal. Take eltrombopag at least 2 hours before and 4 hours after foods high in calcium.    Storage, Handling Precautions, & Disposal   ??? Store at room temperature in the original container.    ??? Store in a dry place.  Do not store in a bathroom.  ??? Keep the lid tightly closed. Keep out of the reach of children and pets.  ??? Do not flush down a toilet or pour down a drain unless instructed to do so.  Check with your local police department or fire station about drug take-back programs in your area.      Current Medications (including OTC/herbals), Comorbidities and Allergies     No current facility-administered medications for this visit.     Current Outpatient Medications   Medication Sig Dispense Refill   ??? eltrombopag (PROMACTA) 50 MG tablet Take 1 tablet (50 mg total) by mouth daily. Administer on an empty stomach, 1 hour before or 2 hours after a meal. 30 tablet 2   ??? tacrolimus (PROGRAF) 0.5 MG capsule Take 3 capsules (1.5 mg total) by mouth two (2) times a day. 180 capsule 5     Facility-Administered Medications Ordered in Other Visits   Medication Dose Route Frequency Provider Last Rate Last Admin   ??? acetaminophen (TYLENOL) tablet 650 mg  650 mg Oral Q4H PRN Artelia Laroche, MD       ??? ALPRAZolam Prudy Feeler) tablet 0.25 mg  0.25 mg Oral BID PRN Lou Miner Zanter, ANP   0.25 mg at 02/12/20 0108   ??? aluminum-magnesium hydroxide-simethicone (MAALOX MAX) 80-80-8 mg/mL oral suspension  30 mL Oral Q4H PRN Tomasa Hosteller, PA   30 mL at 01/26/20 1801   ??? amLODIPine (NORVASC) tablet 5 mg  5 mg Oral Daily Doristine Locks, FNP   5 mg at 02/19/20 1610   ??? calcium carbonate (TUMS) chewable tablet 200 mg of elem calcium  200 mg of elem calcium Oral TID PRN Janyth Contes, FNP   200 mg of elem calcium at 02/06/20 1631   ??? cefdinir (OMNICEF) capsule 300 mg  300 mg Oral Q12H [facility] Roberto Scales,  MD   300 mg at 02/19/20 0915   ??? CETAPHIL topical cleanser 1 application  1 application Topical 4x Daily PRN Tomasa Hosteller, PA       ??? diphenhydrAMINE (BENADRYL) injection 25 mg  25 mg Intravenous Q4H PRN Artelia Laroche, MD       ??? diphenhydrAMINE (BENADRYL) injection 25 mg  25 mg Intravenous Q4H PRN Artelia Laroche, MD       ??? diphenhydrAMINE (BENADRYL) injection 50 mg  50 mg Intravenous Once PRN Artelia Laroche, MD       ??? diphenhydrAMINE (BENADRYL) injection 50 mg  50 mg Intravenous Once PRN Artelia Laroche, MD       ??? docusate sodium (COLACE) capsule 200 mg  200 mg Oral BID PRN Lou Miner Zanter, ANP   200 mg at 01/12/20 1104   ??? eltrombopag (PROMACTA) tablet 50 mg  50 mg Oral Daily Roberto Scales, MD   50 mg at 02/19/20 8546   ??? emollient combination no.92 (LUBRIDERM) lotion 1 application  1 application Topical 4x Daily PRN Tomasa Hosteller, PA   1 application at 01/25/20 0330   ??? EPINEPHrine (EPIPEN) injection 0.3 mg  0.3 mg Intramuscular Daily PRN Artelia Laroche, MD       ??? EPINEPHrine [facility]) injection 0.3 mg  0.3 mg Intramuscular Daily PRN Artelia Laroche, MD       ??? famotidine (PEPCID) tablet 20 mg  20 mg Oral BID Tomasa Hosteller, PA   20 mg at 02/19/20 0916   ??? famotidine (PF) (PEPCID) injection 20 mg  20 mg Intravenous Q4H PRN Artelia Laroche, MD       ??? famotidine (PF) (PEPCID) injection 20 mg  20 mg Intravenous Q4H PRN Artelia Laroche, MD       ??? folic acid (FOLVITE) tablet 1 mg  1 mg Oral At bedtime Libby Maw, ANP   1 mg at 02/18/20 2046   ??? guaiFENesin (ROBITUSSIN) oral syrup  200 mg Oral Q4H PRN Karie Chimera, AGNP       ??? heparin, porcine (PF) 100 unit/mL injection 2 mL  2 mL Intravenous Q MWF Faith Elie Confer, PA   2 mL at 02/18/20 2703   ??? IP OKAY TO TREAT   Other Continuous PRN Libby Maw, ANP       ??? levothyroxine (SYNTHROID) tablet 100 mcg  100 mcg Oral Daily Lou Miner Zanter, ANP   100 mcg at 02/19/20 5009   ??? lidocaine (XYLOCAINE) 2% viscous mucosal solution  10 mL Mouth Q2H PRN Faith Elie Confer, PA       ??? lithium (LITHOBID) ER tablet 300 mg  300 mg Oral At bedtime Libby Maw, ANP   300 mg at 02/18/20 2046   ??? loperamide (IMODIUM) capsule 2 mg  2 mg Oral Once PRN Tomasa Hosteller, PA       ??? loperamide (IMODIUM) capsule 2 mg  2 mg Oral Q3H PRN Faith Elie Confer, PA       ??? loratadine (CLARITIN) tablet 10 mg  10 mg Oral Daily Margretta Ditty, MD   10 mg at 02/19/20 0916   ??? LORazepam (ATIVAN) injection 1.5 mg  1.5 mg Intravenous Q4H PRN Roberto Scales, MD   1.5 mg at 02/10/20 1410   ??? magnesium oxide (MAG-OX) tablet 1,200 mg  1,200 mg Oral Q6H PRN Tomasa Hosteller, PA       ??? magnesium oxide (  MAG-OX) tablet 800 mg  800 mg Oral Q6H PRN Tomasa Hosteller, PA   800 mg at 02/19/20 1610   ??? meperidine (DEMEROL) injection 25 mg  25 mg Intravenous Q30 Min PRN Artelia Laroche, MD       ??? meperidine (DEMEROL) injection 25 mg  25 mg Intravenous Q30 Min PRN Artelia Laroche, MD       ??? methylPREDNISolone sodium succinate (PF) (Solu-MEDROL) injection 125 mg  125 mg Intravenous Q4H PRN Artelia Laroche, MD       ??? methylPREDNISolone sodium succinate (PF) (Solu-MEDROL) injection 125 mg  125 mg Intravenous Q4H PRN Artelia Laroche, MD       ??? mucositis mixture (with lidocaine)  10 mL Mucous Membrane Q2H PRN Tomasa Hosteller, PA   10 mL at 02/04/20 1039   ??? OLANZapine zydis (ZyPREXA) disintegrating tablet 5 mg  5 mg Oral Nightly Hillary Bow, MD   5 mg at 02/18/20 2046   ??? ondansetron (ZOFRAN-ODT) disintegrating tablet 8 mg  8 mg Oral Q8H PRN Roberto Scales, MD   8 mg at 02/19/20 0915   ??? polyethylene glycol (MIRALAX) packet 17 g  17 g Oral BID PRN Roberto Scales, MD   17 g at 02/19/20 1213   ??? posaconazole (NOXAFIL) delayed released tablet 300 mg  300 mg Oral Daily Lou Miner Zanter, ANP   300 mg at 02/19/20 0915   ??? potassium chloride (KLOR-CON) CR tablet 40 mEq  40 mEq Oral Q6H PRN Tomasa Hosteller, PA   40 mEq at 02/07/20 0506   ??? potassium chloride (KLOR-CON) CR tablet 60 mEq  60 mEq Oral Q6H PRN Tomasa Hosteller, PA   60 mEq at 01/15/20 0444   ??? prochlorperazine (COMPAZINE) tablet 10 mg  10 mg Oral Q6H PRN Tomasa Hosteller, PA   10 mg at 02/17/20 1413    Or   ??? prochlorperazine (COMPAZINE) injection 10 mg  10 mg Intravenous Q6H PRN Tomasa Hosteller, PA   10 mg at 01/13/20 0031   ??? senna (SENOKOT) tablet 2 tablet  2 tablet Oral Nightly Roberto Scales, MD   2 tablet at 02/18/20 2045   ??? simethicone (MYLICON) chewable tablet 80 mg  80 mg Oral Q6H PRN Hillary Bow, MD   80 mg at 02/04/20 0004   ??? sodium chloride (NS) 0.9 % infusion  20 mL/hr Intravenous Continuous Tomasa Hosteller, PA 20 mL/hr at 01/09/20 1205 20 mL/hr at 01/09/20 1205   ??? sodium chloride (NS) 0.9 % infusion  20 mL/hr Intravenous Continuous PRN Artelia Laroche, MD       ??? sodium chloride (NS) 0.9 % infusion  20 mL/hr Intravenous Continuous PRN Artelia Laroche, MD       ??? sodium chloride 0.9% (NS) bolus 1,000 mL  1,000 mL Intravenous Daily PRN Artelia Laroche, MD       ??? sodium chloride 0.9% (NS) bolus 1,000 mL 1,000 mL Intravenous Daily PRN Artelia Laroche, MD       ??? tacrolimus (PROGRAF) 1.5mg  combo product  1.5 mg Oral BID Roberto Scales, MD   1.5 mg at 02/19/20 9604   ??? tbo-filgrastim (GRANIX) injection 300 mcg  300 mcg Subcutaneous Q24H Artelia Laroche, MD   300 mcg at 02/18/20 2044   ??? traMADoL (ULTRAM) tablet 50 mg  50 mg Oral Q6H PRN Janyth Contes, FNP   50 mg at 01/22/20 0501   ???  valACYclovir (VALTREX) tablet 500 mg  500 mg Oral Daily Lou Miner Zanter, ANP   500 mg at 02/19/20 0915   ??? vortioxetine (TRINTELLIX) tablet 10 mg  10 mg Oral At bedtime Libby Maw, ANP   10 mg at 02/18/20 2048   ??? zolpidem (AMBIEN) tablet 5 mg  5 mg Oral Nightly Lou Miner Zanter, ANP   5 mg at 02/18/20 2346       No Known Allergies    Patient Active Problem List   Diagnosis   ??? Chronic lymphocytic leukemia (CLL), B-cell (CMS-HCC)   ??? Hypogammaglobulinemia (CMS-HCC)   ??? Chronic lymphocytic leukemia of B-cell type not having achieved remission (CMS-HCC)   ??? Aplastic anemia (CMS-HCC)   ??? 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 Lower abdominal pain   ??? Drug-induced nausea and vomiting   ??? Stem cell transplant candidate       Reviewed and up to date in Epic.    Appropriateness of Therapy     Is medication and dose appropriate based on diagnosis? Yes    Prescription has been clinically reviewed: Yes    Baseline Quality of Life Assessment      How many days over the past month did your aplastic anemia  keep you from your normal activities? For example, brushing your teeth or getting up in the morning. 0    Financial Information     Medication Assistance provided: Copay Assistance    Anticipated copay of $0 / 30 reviewed with patient. Verified delivery address.    Delivery Information     Scheduled delivery date: 02/20/20    Expected start date: 07/31 or 08/01    Medication will be delivered via [facility] to the temporary address in [place].  This shipment will not require a signature.      Explained the services we provide at [facility] and that each month we would call to set up refills.  Stressed importance of returning phone calls so that we could ensure they receive their medications in time each month.  Informed patient that we should be setting up refills 7-10 days prior to when they will run out of medication.  A pharmacist will reach out to perform a clinical assessment periodically.  Informed patient that a welcome packet and a drug information handout will be sent.      Patient verbalized understanding of the above information as well as how to contact the pharmacy at [phone] option 4 with any questions/concerns.  The pharmacy is open Monday through Friday 8:30am-4:30pm.  A pharmacist is available 24/7 via pager to answer any clinical questions they may have.    Patient Specific Needs     - Does the patient have any physical, cognitive, or cultural barriers? No    - Patient prefers to have medications discussed with  Patient     - Is the patient or caregiver able to read and understand education materials at a high school level or above? Yes    - Patient's primary language is  English     - Is the patient high risk? Yes, patient is taking oral chemotherapy. Appropriateness of therapy has been assessed.     - Does the patient require a Care Management Plan? No     - Does the patient require physician intervention or other additional services (i.e. nutrition, smoking cessation, social work)? No      Asheton Viramontes A Shari Heritage  [facility] Specialty Pharmacist

## 2020-02-20 LAB — CBC W/ AUTO DIFF
BASOPHILS ABSOLUTE COUNT: 0 10*9/L (ref 0.0–0.1)
BASOPHILS RELATIVE PERCENT: 0 %
EOSINOPHILS RELATIVE PERCENT: 0.4 %
HEMATOCRIT: 21.9 % — ABNORMAL LOW (ref 36.0–46.0)
LYMPHOCYTES ABSOLUTE COUNT: 0.1 10*9/L — ABNORMAL LOW (ref 1.5–5.0)
LYMPHOCYTES RELATIVE PERCENT: 13.2 %
MEAN CORPUSCULAR HEMOGLOBIN CONC: 36.6 g/dL (ref 31.0–37.0)
MEAN CORPUSCULAR HEMOGLOBIN: 29.2 pg (ref 26.0–34.0)
MEAN CORPUSCULAR VOLUME: 79.8 fL — ABNORMAL LOW (ref 80.0–100.0)
MONOCYTES ABSOLUTE COUNT: 0.1 10*9/L — ABNORMAL LOW (ref 0.2–0.8)
MONOCYTES RELATIVE PERCENT: 15.1 %
NEUTROPHILS ABSOLUTE COUNT: 0.4 10*9/L — CL (ref 2.0–7.5)
NEUTROPHILS RELATIVE PERCENT: 62.1 %
PLATELET COUNT: 11 10*9/L — ABNORMAL LOW (ref 150–440)
RED BLOOD CELL COUNT: 2.74 10*12/L — ABNORMAL LOW (ref 4.00–5.20)
RED CELL DISTRIBUTION WIDTH: 12.7 % (ref 12.0–15.0)
WBC ADJUSTED: 0.6 10*9/L — ABNORMAL LOW (ref 4.5–11.0)

## 2020-02-20 LAB — GLUCOSE RANDOM: Glucose:MCnc:Pt:Ser/Plas:Qn:: 143

## 2020-02-20 LAB — NEUTROPHILS RELATIVE PERCENT: Neutrophils/100 leukocytes:NFr:Pt:Bld:Qn:Automated count: 62.1

## 2020-02-20 LAB — BASIC METABOLIC PANEL
ANION GAP: 6 mmol/L (ref 5–14)
BLOOD UREA NITROGEN: 16 mg/dL (ref 9–23)
BUN / CREAT RATIO: 19
CALCIUM: 9.2 mg/dL (ref 8.7–10.4)
CO2: 27 mmol/L (ref 20.0–31.0)
EGFR CKD-EPI AA FEMALE: 89 mL/min/{1.73_m2} (ref >=60)
EGFR CKD-EPI NON-AA FEMALE: 77 mL/min/{1.73_m2} (ref >=60)
GLUCOSE RANDOM: 143 mg/dL (ref 70–179)
POTASSIUM: 3.9 mmol/L (ref 3.4–4.5)
SODIUM: 141 mmol/L (ref 135–145)

## 2020-02-20 LAB — HEPATIC FUNCTION PANEL
ALBUMIN: 3.2 g/dL — ABNORMAL LOW (ref 3.4–5.0)
ALKALINE PHOSPHATASE: 126 U/L — ABNORMAL HIGH (ref 46–116)
ALT (SGPT): 76 U/L — ABNORMAL HIGH (ref 10–49)
AST (SGOT): 25 U/L (ref <=34)
BILIRUBIN TOTAL: 0.4 mg/dL (ref 0.3–1.2)

## 2020-02-20 LAB — MAGNESIUM: Magnesium:MCnc:Pt:Ser/Plas:Qn:: 1.3 — ABNORMAL LOW

## 2020-02-20 LAB — PROTEIN TOTAL: Protein:MCnc:Pt:Ser/Plas:Qn:: 5.6 — ABNORMAL LOW

## 2020-02-20 MED ADMIN — eltrombopag (PROMACTA) tablet 25 mg: 25 mg | ORAL | @ 18:00:00 | Stop: 2020-02-20

## 2020-02-20 MED ADMIN — levothyroxine (SYNTHROID) tablet 100 mcg: 100 ug | ORAL | @ 09:00:00

## 2020-02-20 MED ADMIN — vortioxetine (TRINTELLIX) tablet 10 mg: 10 mg | ORAL | @ 01:00:00

## 2020-02-20 MED ADMIN — cefdinir (OMNICEF) capsule 300 mg: 300 mg | ORAL | @ 01:00:00 | Stop: 2020-03-18

## 2020-02-20 MED ADMIN — posaconazole (NOXAFIL) delayed released tablet 300 mg: 300 mg | ORAL | @ 13:00:00

## 2020-02-20 MED ADMIN — tacrolimus (PROGRAF) 1.5mg combo product: 1.5 mg | ORAL | @ 01:00:00

## 2020-02-20 MED ADMIN — loratadine (CLARITIN) tablet 10 mg: 10 mg | ORAL | @ 13:00:00

## 2020-02-20 MED ADMIN — tbo-filgrastim (GRANIX) injection 300 mcg: 300 ug | SUBCUTANEOUS | @ 01:00:00

## 2020-02-20 MED ADMIN — heparin, porcine (PF) 100 unit/mL injection 2 mL: 2 mL | INTRAVENOUS | @ 13:00:00

## 2020-02-20 MED ADMIN — magnesium oxide (MAG-OX) tablet 800 mg: 800 mg | ORAL | @ 09:00:00

## 2020-02-20 MED ADMIN — folic acid (FOLVITE) tablet 1 mg: 1 mg | ORAL | @ 01:00:00

## 2020-02-20 MED ADMIN — cefdinir (OMNICEF) capsule 300 mg: 300 mg | ORAL | @ 13:00:00 | Stop: 2020-03-18

## 2020-02-20 MED ADMIN — famotidine (PEPCID) tablet 20 mg: 20 mg | ORAL | @ 01:00:00

## 2020-02-20 MED ADMIN — famotidine (PEPCID) tablet 20 mg: 20 mg | ORAL | @ 13:00:00

## 2020-02-20 MED FILL — PROMACTA 50 MG TABLET: 30 days supply | Qty: 30 | Fill #0 | Status: AC

## 2020-02-20 MED FILL — TACROLIMUS 0.5 MG CAPSULE, IMMEDIATE-RELEASE: 30 days supply | Qty: 180 | Fill #0 | Status: AC

## 2020-02-20 NOTE — Unmapped (Signed)
BMT-CT Inpatient Progress Note    Patient Name: Susan Kane  MRN: [id number]  Encounter Date: 02/20/20     Referring Physician: Dr. Jillyn Hidden Nida Boatman) [name]  Primary Care Provider: Thora Lance, MD  BMT Attending MD: Dr. Oswald Hillock    Disease: CLL  Current disease status: SD (stable disease)  Type of Transplant: RIC MUD Allo  Graft Source: Cryopreserved PBSCs  Transplant Day: D+37 (02/20/20)    Study Participant: BMT CTN 1704: Composite Health Assessment Risk Model for Older Adults: Applying Pre-transplant Comorbidity, Geriatric Assessment and Biomarkers to Predict Non-Relapse Mortality After Allogeneic Transplant (CHARM).    Susan Kane is a 59 y.o. 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     Interval History:  No acute events overnight. Counts remain stable. Denies symptoms of fevers, chills, dyspnea, cough, chest pain, nausea, vomiting, abdominal pain, diarrhea.    Review of Systems:  A comprehensive review of systems was negative except for pertinent positives noted above.     Reviewed and updated past medical, surgical, social, and family history as appropriate.     Objective:  Temp:  [36.7 ??C (98 ??F)-37 ??C (98.6 ??F)] 37 ??C (98.6 ??F)  Heart Rate:  [83-91] 89  Resp:  [15-18] 18  BP: (103-120)/(56-64) 120/64  MAP (mmHg):  [73-87] 85  SpO2:  [94 %-99 %] 95 %   Vitals:    02/18/20 2057 02/19/20 2053   Weight: 56.4 kg (124 lb 4.8 oz) 56.2 kg (123 lb 12.8 oz)      80, Normal activity with effort; some signs or symptoms of disease (ECOG equivalent 1)    Physical Exam:  General : No acute distress noted.   Central venous access: Line clean, dry, intact. No erythema or drainage noted.   ENT: Moist mucous membranes. Oropharynx without lesions, erythema or exudate.   Cardiovascular: Regular rate and rhythm. Normal S1 and S2, without any murmur, rub, or gallop.  Lungs: Clear to auscultation. Normal work of breathing.  Skin: Warm, dry, intact. No rash noted.   GI: Normoactive bowel sounds, abdomen soft, non-tender   Extremeties: No edema.   Neurologic: Alert and oriented to person, place, and time. Normal strength and sensation throughout     Test Results: I personally reviewed these labs.  WBC   Date Value Ref Range Status   02/20/2020 0.6 (L) 4.5 - 11.0 10*9/L Final   02/07/2020 <0.1 (LL) 4.5 - 11.0 10*9/L Final     Comment:     WBC count insufficient for precise differential.      HGB   Date Value Ref Range Status   02/20/2020 8.0 (L) 12.0 - 16.0 g/dL Final     HCT   Date Value Ref Range Status   02/20/2020 21.9 (L) 36.0 - 46.0 % Final     Platelet   Date Value Ref Range Status   02/20/2020 11 (L) 150 - 440 10*9/L Final     Absolute Neutrophils   Date Value Ref Range Status   02/20/2020 0.4 (LL) 2.0 - 7.5 10*9/L Final     Absolute Eosinophils   Date Value Ref Range Status   02/20/2020 0.0 0.0 - 0.4 10*9/L Final     Sodium   Date Value Ref Range Status   02/20/2020 141 135 - 145 mmol/L Final     Potassium   Date Value Ref Range Status  02/20/2020 3.9 3.4 - 4.5 mmol/L Final     Chloride   Date Value Ref Range Status   02/20/2020 108 (H) 98 - 107 mmol/L Final     CO2   Date Value Ref Range Status   02/20/2020 27.0 20.0 - 31.0 mmol/L Final     BUN   Date Value Ref Range Status   02/20/2020 16 9 - 23 mg/dL Final     Creatinine   Date Value Ref Range Status   02/20/2020 0.83 (H) 0.60 - 0.80 mg/dL Final     Glucose   Date Value Ref Range Status   02/20/2020 143 70 - 179 mg/dL Final     Calcium   Date Value Ref Range Status   02/20/2020 9.2 8.7 - 10.4 mg/dL Final     Magnesium   Date Value Ref Range Status   02/20/2020 1.3 (L) 1.6 - 2.6 mg/dL Final     Total Bilirubin   Date Value Ref Range Status   02/20/2020 0.4 0.3 - 1.2 mg/dL Final     Total Protein   Date Value Ref Range Status   02/20/2020 5.6 (L) 5.7 - 8.2 g/dL Final     Albumin   Date Value Ref Range Status   02/20/2020 3.2 (L) 3.4 - 5.0 g/dL Final ALT   Date Value Ref Range Status   02/20/2020 76 (H) 10 - 49 U/L Final     AST   Date Value Ref Range Status   02/20/2020 25 <=34 U/L Final     Alkaline Phosphatase   Date Value Ref Range Status   02/20/2020 126 (H) 46 - 116 U/L Final     LDH   Date Value Ref Range Status   02/19/2020 196 120 - 246 U/L Final      Assessment/Plan:    BMT: SD (stable disease)  HCT-CI (age adjusted) 5 (active CLL, psych, age adjusted)   Conditioning:  1. Rituximab 375 mg/m2 on day -13 and 1000 mg/m2 on D-6, +1, and +8  2. Bendamustine 130 mg/m2 IV daily over 10 minutes on D-5 through -3  3. Fludarabine 30 mg/m2 IV daily over 30 minutes on D-5 through -3  ??  Donor: 10/10, ABO ), CMV negative  Engraftment: Granix starting D+12 through neutrophil recovery (as defined as ANC 1.0 x 2 days or 3.0 x 1 day)  - Date of last granix injection: TBD  - D22 no engraftment,  - Viral studies showed weakly positive EBV, CMV, HHV-6, plan to monitor for now  - BMBx results showed hypocellular marrow without evidence of hematopiesis. Chimerism studies are still pending  - Repeat viral studies on 7/26  - Started eltrombopag 50 mg daily on 7/27, increased dose to 75 mg daily on 7/30    GvHD prophylaxis:   1. Tacrolimus 0.045 mg/kg PO BID starting D-3 followed by 0.03 mg/kg PO BID starting on D-1 with a goal of 5-10 ng/mL  ????** Current dose 1/0.5mg  BID  2. Methotrexate 5 mg/m2 on D+1, +3, +6 and +11  3. rATG 1 mg/kg on D-2 and -1 included  ??  Heme:   - Transfusion criteria: Transfuse 1 unit of PRBCs for Hgb<7 and 1 unit of platelets for Plt <10K or bleeding. No history of transfusion reactions.     ID:   Prophylaxis:  - Antiviral: Continue Valtrex 500 mg po daily on admit  - Antifungal: Posaconazole 300 mg daily  - Antibacterial: Will use cefdinir 300 BID for prophy  ????** history  of nausea with levofloxacin when given with CNI and higher dose of Trintellix, so unclear which agent caused nausea. Patient ok with re-challenging with levofloxacin with plan to switch to cefdinir if she develops significant nausea while on levofloxacin  - PJP: Bactrim DS once daily MWF upon platelet engraftment.  ??  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 Repeat CMV IgG on 01/07/2020 was once again negative.  ??  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   ??  CINV:   - Anti-emetics per BMT protocol.   - Zofran works well for her, scheduled ODT q 8 hr (6/22)  - Nightly zyprexa (added 7/2)  ??  Diarrhea: Resolved.     Constipation: Changing miralax to PRN, reducing senna to 1 tab at night.    Renal:   Non-oliguric AKI: Cr increased to 1.16 from baseline of ~ 0.7. Likely pre-renal in the setting of poor PO intake. No history of urinary retention/obstruction.  - Follow up tacro levels  - s/p 1u pRBCs and NS, assess response  - Follow up urine studies  - Strict I&O, avoid nephrotoxins  ??  FEN:  Electrolyte replacement per protocol  - Continue folic acid.  - Will monitor PO fluid intake and administer IVF as necessary  ??  Endocrine:  Hypothyroid:??  - Continue Synthroid daily.     Hepatic:   - VOD prophylaxis not warranted as non-myeloablative regimen.     Hepatocellular liver injury pattern:  - Mild increase in AST, ALT, and AlkPhos, likely secondary to posaconazole  - Follow up repeat viral studies on Monday     CV:   HTN: Improved on 5mg  amlodipine.     Pulm:   AHRF: First noted on 7/22. Intermittent O2 requirement with exam findings concerning for LLL crackles. No signs of volume overload on exam.  - Chest-X ray x2 clear  - Assess O2 requirement with walk test  - Consider CT Chest w/o contrast if persistent    Neuro/Pain:   - Oxycodone 5 mg po prn.     Psych: Has a home psychiatrist. Depression/Anxiety:   - Continue Xanax 0.25 mg BID PRN for anxiety  - Continue Lithium 300mg  nightly  - Continue Trinellix 10mg  nightly (patient will bring her home supply).  - Psych consult PRN.   ??  Insomnia:  - Ambien 5mg  nightly.   ??  Caregiver/Lodging:  - Husband [name] will be primary caregiver  - Plan to stay in local rental post transplant??    Plan Summary:  - Increased eltrombopag to 75 mg daily  - Potential plan during the weekend if her counts responds (ANC>0.5), appointments already arranged for Monday      Jos?? C. Mart??nez, MD, PhD  Hematology and Oncology Fellow  [facility]

## 2020-02-20 NOTE — Unmapped (Signed)
Patient is day +36 from her allo MUD transplant. No acute events this shift. Safety precautions maintained. Zofran given x1 before morning meds for nausea. Miralax given x1 for constipation. Completed CVAD dressing change education with daughter. Will continue to monitor and provide nursing interventions.    Problem: Adult Inpatient Plan of Care  Goal: Plan of Care Review  Outcome: Ongoing - Unchanged  Goal: Patient-Specific Goal (Individualization)  Outcome: Ongoing - Unchanged  Goal: Absence of Hospital-Acquired Illness or Injury  Outcome: Ongoing - Unchanged  Goal: Optimal Comfort and Wellbeing  Outcome: Ongoing - Unchanged  Goal: Readiness for Transition of Care  Outcome: Ongoing - Unchanged  Goal: Rounds/Family Conference  Outcome: Ongoing - Unchanged     Problem: Infection  Goal: Infection Symptom Resolution  Outcome: Ongoing - Unchanged     Problem: Fatigue (Stem Cell/Bone Marrow Transplant)  Goal: Energy Level Supports Daily Activity  Outcome: Ongoing - Unchanged     Problem: Hematologic Alteration (Stem Cell/Bone Marrow Transplant)  Goal: Blood Counts Within Acceptable Range  Outcome: Ongoing - Unchanged     Problem: Nausea and Vomiting (Stem Cell/Bone Marrow Transplant)  Goal: Nausea and Vomiting Symptom Relief  Outcome: Ongoing - Unchanged     Problem: Nutrition Intake Altered (Stem Cell/Bone Marrow Transplant)  Goal: Optimal Nutrition Intake  Outcome: Ongoing - Unchanged

## 2020-02-20 NOTE — Unmapped (Signed)
Pt day +36 ALLO SCT. Vital Signs: Pt remained afebrile during shift, all other VSS. Pain: Pt denies pain. Nausea/ Vomiting: Pt endorses nausea, PRN Zofran given. Diarrhea: pt denies. Blood Products/Replacements: Pt received  replacements: Magnesium 800mg . Falls/safety: Pt remained free from falls and injury this shift. Bed in lowest position. Call bell within reach. Other: Mag recheck scheduled for 0930. at Pt has no complaints at this time. Will continue to monitor patient and POC.    Problem: Adult Inpatient Plan of Care  Goal: Plan of Care Review  Outcome: Ongoing - Unchanged  Goal: Patient-Specific Goal (Individualization)  Outcome: Ongoing - Unchanged  Goal: Absence of Hospital-Acquired Illness or Injury  Outcome: Ongoing - Unchanged  Goal: Optimal Comfort and Wellbeing  Outcome: Ongoing - Unchanged  Goal: Readiness for Transition of Care  Outcome: Ongoing - Unchanged  Goal: Rounds/Family Conference  Outcome: Ongoing - Unchanged    Problem: Infection  Goal: Infection Symptom Resolution  Outcome: Ongoing - Unchanged     Problem: Fatigue (Stem Cell/Bone Marrow Transplant)  Goal: Energy Level Supports Daily Activity  Outcome: Ongoing - Unchanged     Problem: Hematologic Alteration (Stem Cell/Bone Marrow Transplant)  Goal: Blood Counts Within Acceptable Range  Outcome: Ongoing - Unchanged     Problem: Nausea and Vomiting (Stem Cell/Bone Marrow Transplant)  Goal: Nausea and Vomiting Symptom Relief  Outcome: Ongoing - Unchanged     Problem: Nutrition Intake Altered (Stem Cell/Bone Marrow Transplant)  Goal: Optimal Nutrition Intake  Outcome: Ongoing - Unchanged

## 2020-02-21 LAB — BASIC METABOLIC PANEL
ANION GAP: 7 mmol/L (ref 5–14)
BLOOD UREA NITROGEN: 14 mg/dL (ref 9–23)
BUN / CREAT RATIO: 17
CALCIUM: 9.3 mg/dL (ref 8.7–10.4)
CHLORIDE: 111 mmol/L — ABNORMAL HIGH (ref 98–107)
CO2: 25 mmol/L (ref 20.0–31.0)
EGFR CKD-EPI AA FEMALE: 89 mL/min/{1.73_m2} (ref >=60)
EGFR CKD-EPI NON-AA FEMALE: 77 mL/min/{1.73_m2} (ref >=60)
GLUCOSE RANDOM: 123 mg/dL (ref 70–179)
POTASSIUM: 3.7 mmol/L (ref 3.4–4.5)
SODIUM: 143 mmol/L (ref 135–145)

## 2020-02-21 LAB — CBC W/ AUTO DIFF
BASOPHILS RELATIVE PERCENT: 0.1 %
EOSINOPHILS ABSOLUTE COUNT: 0 10*9/L (ref 0.0–0.4)
EOSINOPHILS RELATIVE PERCENT: 0 %
HEMATOCRIT: 23 % — ABNORMAL LOW (ref 36.0–46.0)
HEMOGLOBIN: 8.2 g/dL — ABNORMAL LOW (ref 12.0–16.0)
LARGE UNSTAINED CELLS: 12 % — ABNORMAL HIGH (ref 0–4)
LYMPHOCYTES ABSOLUTE COUNT: 0.1 10*9/L — ABNORMAL LOW (ref 1.5–5.0)
LYMPHOCYTES RELATIVE PERCENT: 13.9 %
MEAN CORPUSCULAR HEMOGLOBIN CONC: 35.7 g/dL (ref 31.0–37.0)
MEAN CORPUSCULAR HEMOGLOBIN: 28.4 pg (ref 26.0–34.0)
MEAN CORPUSCULAR VOLUME: 79.6 fL — ABNORMAL LOW (ref 80.0–100.0)
MONOCYTES ABSOLUTE COUNT: 0.1 10*9/L — ABNORMAL LOW (ref 0.2–0.8)
MONOCYTES RELATIVE PERCENT: 13.9 %
NEUTROPHILS ABSOLUTE COUNT: 0.5 10*9/L — ABNORMAL LOW (ref 2.0–7.5)
NEUTROPHILS RELATIVE PERCENT: 60.4 %
PLATELET COUNT: 7 10*9/L — CL (ref 150–440)
RED BLOOD CELL COUNT: 2.89 10*12/L — ABNORMAL LOW (ref 4.00–5.20)
RED CELL DISTRIBUTION WIDTH: 12.8 % (ref 12.0–15.0)
WBC ADJUSTED: 0.9 10*9/L — ABNORMAL LOW (ref 4.5–11.0)

## 2020-02-21 LAB — MAGNESIUM: Magnesium:MCnc:Pt:Ser/Plas:Qn:: 1.5 — ABNORMAL LOW

## 2020-02-21 LAB — CHLORIDE: Chloride:SCnc:Pt:Ser/Plas:Qn:: 111 — ABNORMAL HIGH

## 2020-02-21 LAB — HEPATIC FUNCTION PANEL
ALKALINE PHOSPHATASE: 132 U/L — ABNORMAL HIGH (ref 46–116)
ALT (SGPT): 67 U/L — ABNORMAL HIGH (ref 10–49)
AST (SGOT): 27 U/L (ref <=34)
BILIRUBIN DIRECT: 0.2 mg/dL (ref 0.00–0.30)

## 2020-02-21 LAB — RED CELL DISTRIBUTION WIDTH: Lab: 12.8

## 2020-02-21 LAB — TACROLIMUS, TROUGH: Lab: 6.5

## 2020-02-21 LAB — ALT (SGPT): Alanine aminotransferase:CCnc:Pt:Ser/Plas:Qn:: 67 — ABNORMAL HIGH

## 2020-02-21 LAB — PLATELET COUNT: Platelets:NCnc:Pt:Bld:Qn:Automated count: 15 — ABNORMAL LOW

## 2020-02-21 MED ORDER — MAGNESIUM OXIDE 400 MG (241.3 MG MAGNESIUM) TABLET
ORAL_TABLET | Freq: Every day | ORAL | 2 refills | 15.00000 days | Status: CN
Start: 2020-02-21 — End: ?

## 2020-02-21 MED ORDER — OLANZAPINE 5 MG DISINTEGRATING TABLET
ORAL_TABLET | Freq: Every evening | ORAL | 2 refills | 30 days | Status: CP
Start: 2020-02-21 — End: ?
  Filled 2020-02-23: qty 30, 30d supply, fill #0

## 2020-02-21 MED ORDER — FAMOTIDINE 20 MG TABLET
ORAL_TABLET | Freq: Two times a day (BID) | ORAL | 0 refills | 30.00000 days | Status: CP
Start: 2020-02-21 — End: 2020-03-22
  Filled 2020-02-23: qty 60, 30d supply, fill #0

## 2020-02-21 MED ORDER — MG-PLUS-PROTEIN 133 MG TABLET
ORAL_TABLET | Freq: Two times a day (BID) | ORAL | 2 refills | 50 days | Status: CP
Start: 2020-02-21 — End: ?
  Filled 2020-02-23: qty 100, 50d supply, fill #0

## 2020-02-21 MED ADMIN — famotidine (PEPCID) tablet 20 mg: 20 mg | ORAL | @ 01:00:00

## 2020-02-21 MED ADMIN — valACYclovir (VALTREX) tablet 500 mg: 500 mg | ORAL | @ 13:00:00

## 2020-02-21 MED ADMIN — folic acid (FOLVITE) tablet 1 mg: 1 mg | ORAL | @ 01:00:00

## 2020-02-21 MED ADMIN — senna (SENOKOT) tablet 2 tablet: 2 | ORAL | @ 01:00:00

## 2020-02-21 MED ADMIN — polyethylene glycol (MIRALAX) packet 17 g: 17 g | ORAL | @ 16:00:00

## 2020-02-21 MED ADMIN — tacrolimus (PROGRAF) 1.5mg combo product: 1.5 mg | ORAL | @ 01:00:00

## 2020-02-21 MED ADMIN — albuterol 2.5 mg /3 mL (0.083 %) nebulizer solution 2.5 mg: 2.5 mg | RESPIRATORY_TRACT | @ 20:00:00

## 2020-02-21 MED ADMIN — ondansetron (ZOFRAN-ODT) disintegrating tablet 8 mg: 8 mg | ORAL | @ 01:00:00

## 2020-02-21 MED ADMIN — posaconazole (NOXAFIL) delayed released tablet 300 mg: 300 mg | ORAL | @ 13:00:00

## 2020-02-21 MED ADMIN — levothyroxine (SYNTHROID) tablet 100 mcg: 100 ug | ORAL | @ 10:00:00

## 2020-02-21 MED ADMIN — cefdinir (OMNICEF) capsule 300 mg: 300 mg | ORAL | @ 13:00:00 | Stop: 2020-03-18

## 2020-02-21 MED ADMIN — cefdinir (OMNICEF) capsule 300 mg: 300 mg | ORAL | @ 01:00:00 | Stop: 2020-03-18

## 2020-02-21 MED ADMIN — OLANZapine zydis (ZyPREXA) disintegrating tablet 5 mg: 5 mg | ORAL | @ 01:00:00

## 2020-02-21 MED ADMIN — magnesium oxide (MAG-OX) tablet 800 mg: 800 mg | ORAL | @ 10:00:00

## 2020-02-21 MED ADMIN — ondansetron (ZOFRAN-ODT) disintegrating tablet 8 mg: 8 mg | ORAL | @ 13:00:00

## 2020-02-21 MED ADMIN — eltrombopag (PROMACTA) tablet 75 mg: 75 mg | ORAL | @ 13:00:00

## 2020-02-21 MED ADMIN — amLODIPine (NORVASC) tablet 5 mg: 5 mg | ORAL | @ 13:00:00

## 2020-02-21 MED ADMIN — pentamidine (PENTAM) 300 mg in sterile water (PF) nebulizer solution: 300 mg | RESPIRATORY_TRACT | @ 20:00:00

## 2020-02-21 NOTE — Unmapped (Signed)
Tacrolimus Therapeutic Monitoring Pharmacy Note  ??  Susan Kane is a 59 y.o. female continuing tacrolimus. She is Day +38   of her NMA benda/flu/ritux alloSCT. She has a history of AKI on tacrolimus.  ??  Indication: GVHD prophylaxis post allogeneic BMT (but on prior to txp for severe aplastic anemia)   ??  Date of Transplant: 01/14/20      Prior Dosing Information: tacrolimus 1.5 mg PO BID  ??  Goals:  Therapeutic Drug Levels  Tacrolimus trough goal: 5-10 ng/mL  ??  Additional Clinical Monitoring/Outcomes  ?? Monitor renal function (SCr and urine output) and liver function (LFTs)  ?? Monitor for signs/symptoms of adverse events (e.g., hyperglycemia, hyperkalemia, hypomagnesemia, hypertension, headache, tremor)  ??  Results:   Tacrolimus level: 6.5 ng/mL, drawn appropriately  ??  Pharmacokinetic Considerations and Significant Drug Interactions:  ?? Concurrent hepatotoxic medications: Posaconazole  ?? Concurrent CYP3A4 substrates/inhibitors: Posaconazole  ?? Concurrent nephrotoxic medications: None identified  ??  Assessment/Plan:  Recommendation(s)  ?? Tacrolimus trough is therapeutic and at steady state levels. Previously has been sub-therapeutic at reduced dose (1mg  AM/1.5 mg PM) with similar renal function.   ?? Renal function currently stable; LFTs downtrending; bili WNL  ?? Continue with current dose of 1.5 mg BID  ??  Follow-up  ?? Next level has been ordered on 8/2 at 0800  ?? A pharmacist will continue to monitor and recommend levels as appropriate  ??  Longitudinal Dose Monitoring:  Date Dose (mg), Route AM Scr (mg/dL) Trough  (ng/mL) Key Drug Interactions   6/18 /0.5 ?? ?? Posaconazole   6/19 0.5/0.5 0.66 ?? Posaconazole   6/20 0.5/0.5 0.84 ?? Posaconazole   6/21 0.5/1 0.84 1.6 Posaconazole   6/22 0.5/1 0.92 ?? Posaconazole   6/23 0.5/1 0.92 ?? Posaconazole   6/24 0.5/1 1.09 3.3 Posaconazole   6/25 1/1 0.96 ?? Posaconazole   6/26 1/1 0.81 ?? Posaconazole   6/27 1/1.5 0.69 4.2 Posaconazole   6/28 1/1.5 0.69 ?? Posaconazole   6/29 1/1.5 0.77 2.0 Posaconazole   6/30 2/1.5 0.79 ?? Posaconazole   7/1 2/1.5 0.69 5.4 Posaconazole   7/2 2/1.5 0.67 ?? Posaconazole   7/3 2/1.5 0.79 5.7 Posaconazole   7/4 2/1.5 0.80 ?? Posaconazole   7/5 2/1.5 0.74 6.2 Posaconazole   7/6 2/1.5 0.96 ?? Posaconazole   7/7 2/1.5 0.66 ?? Posaconazole   7/8 2/1.5 0.73 7.0 Posaconazole   7/9 2/1.5 0.66 ?? Posaconazole   7/10 2/1.5 0.71 ?? Posaconazole   7/11 2/1.5 0.72 ?? Posaconazole   7/12 2/1.5 0.68 7.5 Posaconazole   7/13 2/1.5 0.66 ?? Posaconazole   7/14 2/1.5 0.73 ?? Posaconazole   7/15 2/--- 0.8 11.1 Posaconazole   7/16 ---/1.5 0.76 5.1 Posaconazole   7/17 1.5/1.5 0.74  Posaconazole   7/18 1.5/1.5 0.72 7.8 Posaconazole   7/19 1.5/1.5 0.76  Posaconazole   7/20 1.5/1.5 0.87 6.9 Posaconazole   7/21 1.5/1.5 0.96  Posaconazole   7/22 1.5/1.5 1.16 6.8 Posaconazole   7/23 1.5/1.5 0.91  Posaconazole   7/24 1.5/1.5 0.82  Posaconazole   7/25 1.5/1.5 0.77  Posaconazole   7/26 1.5/1.5 0.87 9.1 Posaconazole   7/27 1.5/1.5 0.78  Posaconazole   7/28 1.5/1.5 0.80 7.8 Posaconazole   7/29 1.5/1.5 0.75  Posaconazole   7/30 1.5/1.5 0.83  Posaconazole   7/31 1.5/1.5 0.83 6.5 Posaconazole   ??  Please page service pharmacist with questions/clarifications.  ??  Alma Downs, PharmD  PGY-2 Oncology Pharmacy Resident

## 2020-02-21 NOTE — Unmapped (Signed)
BMT-CT Inpatient Progress Note    Patient Name: Susan Kane  MRN: [id number]  Encounter Date: 02/21/20     Referring Physician: Dr. Jillyn Hidden Nida Boatman) [name]  Primary Care Provider: Thora Lance, MD  BMT Attending MD: Dr. Oswald Hillock    Disease: CLL  Current disease status: SD (stable disease)  Type of Transplant: RIC MUD Allo  Graft Source: Cryopreserved PBSCs  Transplant Day: D+37 (02/21/20)    Study Participant: BMT CTN 1704: Composite Health Assessment Risk Model for Older Adults: Applying Pre-transplant Comorbidity, Geriatric Assessment and Biomarkers to Predict Non-Relapse Mortality After Allogeneic Transplant (CHARM).    Susan Kane is a 59 y.o. 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     Interval History:  No acute events overnight. Counts slowly improving.  One episode of emesis last night associated with taking Senna.  Will stop senna and switch to Miralax.  If ANC continues to improve can stop Cefdinir tomorrow.  Potential discharge on Sunday. Denies symptoms of fevers, chills, dyspnea, cough, chest pain, nausea, vomiting, abdominal pain, diarrhea.    Review of Systems:  A comprehensive review of systems was negative except for pertinent positives noted above.     Reviewed and updated past medical, surgical, social, and family history as appropriate.     Objective:  Temp:  [36.7 ??C (98.1 ??F)-37.2 ??C (99 ??F)] 37.2 ??C (99 ??F)  Heart Rate:  [84-95] 85  Resp:  [16-18] 16  BP: (97-120)/(52-67) 107/58  MAP (mmHg):  [69-85] 74  SpO2:  [94 %-100 %] 97 %   Vitals:    02/19/20 2053 02/20/20 2051   Weight: 56.2 kg (123 lb 12.8 oz) 55.5 kg (122 lb 6.4 oz)      80 , Normal activity with effort; some signs or symptoms of disease (ECOG equivalent 1)    Physical Exam:  General : No acute distress noted.   Central venous access: Line clean, dry, intact. No erythema or drainage noted.   ENT: Moist mucous membranes. Oropharynx without lesions, erythema or exudate.   Cardiovascular: Regular rate and rhythm. Normal S1 and S2, without any murmur, rub, or gallop.  Lungs: Clear to auscultation. Normal work of breathing.  Skin: Warm, dry, intact. No rash noted.   GI: Normoactive bowel sounds, abdomen soft, non-tender   Extremeties: No edema.   Neurologic: Alert and oriented to person, place, and time. Normal strength and sensation throughout     Test Results: I personally reviewed these labs.  WBC   Date Value Ref Range Status   02/21/2020 0.9 (L) 4.5 - 11.0 10*9/L Final   02/07/2020 <0.1 (LL) 4.5 - 11.0 10*9/L Final     Comment:     WBC count insufficient for precise differential.      HGB   Date Value Ref Range Status   02/21/2020 8.2 (L) 12.0 - 16.0 g/dL Final     HCT   Date Value Ref Range Status   02/21/2020 23.0 (L) 36.0 - 46.0 % Final     Platelet   Date Value Ref Range Status   02/21/2020 15 (L) 150 - 440 10*9/L Final     Absolute Neutrophils   Date Value Ref Range Status   02/21/2020 0.5 (L) 2.0 - 7.5 10*9/L Final     Absolute Eosinophils   Date Value Ref Range Status   02/21/2020 0.0 0.0 - 0.4 10*9/L Final  Sodium   Date Value Ref Range Status   02/21/2020 143 135 - 145 mmol/L Final     Potassium   Date Value Ref Range Status   02/21/2020 3.7 3.4 - 4.5 mmol/L Final     Chloride   Date Value Ref Range Status   02/21/2020 111 (H) 98 - 107 mmol/L Final     CO2   Date Value Ref Range Status   02/21/2020 25.0 20.0 - 31.0 mmol/L Final     BUN   Date Value Ref Range Status   02/21/2020 14 9 - 23 mg/dL Final     Creatinine   Date Value Ref Range Status   02/21/2020 0.83 (H) 0.60 - 0.80 mg/dL Final     Glucose   Date Value Ref Range Status   02/21/2020 123 70 - 179 mg/dL Final     Calcium   Date Value Ref Range Status   02/21/2020 9.3 8.7 - 10.4 mg/dL Final     Magnesium   Date Value Ref Range Status   02/21/2020 1.5 (L) 1.6 - 2.6 mg/dL Final     Total Bilirubin   Date Value Ref Range Status   02/21/2020 0.6 0.3 - 1.2 mg/dL Final     Total Protein   Date Value Ref Range Status   02/21/2020 5.9 5.7 - 8.2 g/dL Final     Albumin   Date Value Ref Range Status   02/21/2020 3.5 3.4 - 5.0 g/dL Final     ALT   Date Value Ref Range Status   02/21/2020 67 (H) 10 - 49 U/L Final     AST   Date Value Ref Range Status   02/21/2020 27 <=34 U/L Final     Alkaline Phosphatase   Date Value Ref Range Status   02/21/2020 132 (H) 46 - 116 U/L Final     LDH   Date Value Ref Range Status   02/19/2020 196 120 - 246 U/L Final      Assessment/Plan:    BMT: SD (stable disease)  HCT-CI (age adjusted) 5 (active CLL, psych, age adjusted)   Conditioning:  1. Rituximab 375 mg/m2 on day -13 and 1000 mg/m2 on D-6, +1, and +8  2. Bendamustine 130 mg/m2 IV daily over 10 minutes on D-5 through -3  3. Fludarabine 30 mg/m2 IV daily over 30 minutes on D-5 through -3  ??  Donor: 10/10, ABO ), CMV negative  Engraftment: Granix starting D+12 through neutrophil recovery (as defined as ANC 1.0 x 2 days or 3.0 x 1 day)  - Date of last granix injection: TBD  - D22 no engraftment,  - Viral studies showed weakly positive EBV, CMV, HHV-6, plan to monitor for now  - BMBx results showed hypocellular marrow without evidence of hematopiesis. Chimerism studies are still pending  - Repeat viral studies on 7/26  - Started eltrombopag 50 mg daily on 7/27, increased dose to 75 mg daily on 7/30    GvHD prophylaxis:   1. Tacrolimus 0.045 mg/kg PO BID starting D-3 followed by 0.03 mg/kg PO BID starting on D-1 with a goal of 5-10 ng/mL  ????** Current dose 1/0.5mg  BID  2. Methotrexate 5 mg/m2 on D+1, +3, +6 and +11  3. rATG 1 mg/kg on D-2 and -1 included  ??  Heme:   - Transfusion criteria: Transfuse 1 unit of PRBCs for Hgb<7 and 1 unit of platelets for Plt <10K or bleeding. No history of transfusion reactions.     ID:  Prophylaxis:  - Antiviral: Continue Valtrex 500 mg po daily on admit  - Antifungal: Posaconazole 300 mg daily  - Antibacterial: Will use cefdinir 300 BID for prophy  ????** history of nausea with levofloxacin when given with CNI and higher dose of Trintellix, so unclear which agent caused nausea. Patient ok with re-challenging with levofloxacin with plan to switch to cefdinir if she develops significant nausea while on levofloxacin  - PJP: Bactrim DS once daily MWF upon platelet engraftment.  ??  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 Repeat CMV IgG on 01/07/2020 was once again negative.  ??  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   ??  CINV:   - Anti-emetics per BMT protocol.   - Zofran works well for her, scheduled ODT q 8 hr (6/22)  - Nightly zyprexa (added 7/2)  ??  Diarrhea: Resolved.     Constipation: Changing miralax to PRN, reducing senna to 1 tab at night.    Renal:   Non-oliguric AKI: Cr increased to 1.16 from baseline of ~ 0.7. Likely pre-renal in the setting of poor PO intake. No history of urinary retention/obstruction.  - Follow up tacro levels  - s/p 1u pRBCs and NS, assess response  - Follow up urine studies  - Strict I&O, avoid nephrotoxins  ??  FEN:  Electrolyte replacement per protocol  - Continue folic acid.  - Will monitor PO fluid intake and administer IVF as necessary  ??  Endocrine:  Hypothyroid:??  - Continue Synthroid daily.     Hepatic:   - VOD prophylaxis not warranted as non-myeloablative regimen.     Hepatocellular liver injury pattern:  - Mild increase in AST, ALT, and AlkPhos, likely secondary to posaconazole  - Follow up repeat viral studies on Monday     CV:   HTN: Improved on 5mg  amlodipine.     Pulm:   AHRF: First noted on 7/22. Intermittent O2 requirement with exam findings concerning for LLL crackles. No signs of volume overload on exam.  - Chest-X ray x2 clear  - Assess O2 requirement with walk test  - Consider CT Chest w/o contrast if persistent    Neuro/Pain:   - Oxycodone 5 mg po prn.     Psych: Has a home psychiatrist.   Depression/Anxiety:   - Continue Xanax 0.25 mg BID PRN for anxiety  - Continue Lithium 300mg  nightly  - Continue Trinellix 10mg  nightly (patient will bring her home supply).  - Psych consult PRN.   ??  Insomnia:  - Ambien 5mg  nightly.   ??  Caregiver/Lodging:  - Husband [name] will be primary caregiver  - Plan to stay in local rental post transplant??    Plan Summary:  - Potential plan to discharge tomorrow if her counts continue to be (ANC>/=0.5), appointments already arranged for Monday  - Stop Senna and start Miralax  - Plan to stop Cefdinir tomorrow if ANC >/= 0.5    Esperanza Richters, MD  Hematology/Oncology Fellow  [facility]row Transplant [facility] Cellular Therapy [facility]gam

## 2020-02-21 NOTE — Unmapped (Signed)
Pt afebrile, VSS, no falls/injuries this shift.  Pt c/o nausea this AM - gave prn zofran, pt reports improvement of symptoms.  CTM  Problem: Adult Inpatient Plan of Care  Goal: Plan of Care Review  Outcome: Progressing  Goal: Patient-Specific Goal (Individualization)  Outcome: Progressing  Goal: Absence of Hospital-Acquired Illness or Injury  Outcome: Progressing  Goal: Optimal Comfort and Wellbeing  Outcome: Progressing  Goal: Readiness for Transition of Care  Outcome: Progressing  Goal: Rounds/Family Conference  Outcome: Progressing     Problem: Fatigue (Stem Cell/Bone Marrow Transplant)  Goal: Energy Level Supports Daily Activity  Outcome: Progressing

## 2020-02-21 NOTE — Unmapped (Signed)
Pt is day +38 s/p allo-HSCT. Vitals: soft BPs overnight, otherwise VSS Nausea/Vomiting: Zofran given prior to taking scheduled medications. Pt reported 1 emesis after taking her senna tabs (before midnight), she declined PRN antiemetics at the time. Diarrhea: denies Blood/Electrolyte Replacements: 1 unit of platelets transfused for plt count of 7. Magnesium replaced.      Problem: Adult Inpatient Plan of Care  Goal: Plan of Care Review  Outcome: Ongoing - Unchanged  Goal: Patient-Specific Goal (Individualization)  Outcome: Ongoing - Unchanged  Goal: Absence of Hospital-Acquired Illness or Injury  Outcome: Ongoing - Unchanged  Goal: Optimal Comfort and Wellbeing  Outcome: Ongoing - Unchanged  Goal: Readiness for Transition of Care  Outcome: Ongoing - Unchanged  Goal: Rounds/Family Conference  Outcome: Ongoing - Unchanged     Problem: Infection  Goal: Infection Symptom Resolution  Outcome: Ongoing - Unchanged     Problem: Fatigue (Stem Cell/Bone Marrow Transplant)  Goal: Energy Level Supports Daily Activity  Outcome: Ongoing - Unchanged     Problem: Hematologic Alteration (Stem Cell/Bone Marrow Transplant)  Goal: Blood Counts Within Acceptable Range  Outcome: Ongoing - Unchanged     Problem: Nausea and Vomiting (Stem Cell/Bone Marrow Transplant)  Goal: Nausea and Vomiting Symptom Relief  Outcome: Ongoing - Unchanged     Problem: Nutrition Intake Altered (Stem Cell/Bone Marrow Transplant)  Goal: Optimal Nutrition Intake  Outcome: Ongoing - Unchanged

## 2020-02-22 LAB — BASIC METABOLIC PANEL
BLOOD UREA NITROGEN: 15 mg/dL (ref 9–23)
CALCIUM: 9.2 mg/dL (ref 8.7–10.4)
CHLORIDE: 110 mmol/L — ABNORMAL HIGH (ref 98–107)
CO2: 27 mmol/L (ref 20.0–31.0)
CREATININE: 0.9 mg/dL — ABNORMAL HIGH
EGFR CKD-EPI NON-AA FEMALE: 70 mL/min/{1.73_m2} (ref >=60)
GLUCOSE RANDOM: 137 mg/dL (ref 70–179)
POTASSIUM: 4.1 mmol/L (ref 3.4–4.5)
SODIUM: 142 mmol/L (ref 135–145)

## 2020-02-22 LAB — CBC W/ AUTO DIFF
BASOPHILS ABSOLUTE COUNT: 0 10*9/L (ref 0.0–0.1)
BASOPHILS RELATIVE PERCENT: 0.2 %
BASOPHILS RELATIVE PERCENT: 0 %
EOSINOPHILS ABSOLUTE COUNT: 0 10*9/L (ref 0.0–0.4)
EOSINOPHILS RELATIVE PERCENT: 0.2 %
EOSINOPHILS RELATIVE PERCENT: 0.3 %
HEMATOCRIT: 19.9 % — ABNORMAL LOW (ref 36.0–46.0)
HEMATOCRIT: 20.1 % — ABNORMAL LOW (ref 36.0–46.0)
HEMOGLOBIN: 7.2 g/dL — ABNORMAL LOW (ref 12.0–16.0)
HEMOGLOBIN: 7.1 g/dL — ABNORMAL LOW (ref 12.0–16.0)
LARGE UNSTAINED CELLS: 12 % — ABNORMAL HIGH (ref 0–4)
LYMPHOCYTES ABSOLUTE COUNT: 0.1 10*9/L — ABNORMAL LOW (ref 1.5–5.0)
LYMPHOCYTES RELATIVE PERCENT: 14.7 %
MEAN CORPUSCULAR HEMOGLOBIN CONC: 36.3 g/dL (ref 31.0–37.0)
MEAN CORPUSCULAR HEMOGLOBIN CONC: 35.6 g/dL (ref 31.0–37.0)
MEAN CORPUSCULAR HEMOGLOBIN: 29 pg (ref 26.0–34.0)
MEAN CORPUSCULAR HEMOGLOBIN: 28.7 pg (ref 26.0–34.0)
MEAN CORPUSCULAR VOLUME: 79.8 fL — ABNORMAL LOW (ref 80.0–100.0)
MEAN CORPUSCULAR VOLUME: 80.7 fL (ref 80.0–100.0)
MEAN PLATELET VOLUME: 10.3 fL — ABNORMAL HIGH (ref 7.0–10.0)
MEAN PLATELET VOLUME: 8.9 fL (ref 7.0–10.0)
MONOCYTES ABSOLUTE COUNT: 0.1 10*9/L — ABNORMAL LOW (ref 0.2–0.8)
MONOCYTES ABSOLUTE COUNT: 0.2 10*9/L (ref 0.2–0.8)
MONOCYTES RELATIVE PERCENT: 16.4 %
MONOCYTES RELATIVE PERCENT: 18.6 %
NEUTROPHILS ABSOLUTE COUNT: 0.3 10*9/L — CL (ref 2.0–7.5)
NEUTROPHILS ABSOLUTE COUNT: 0.4 10*9/L — CL (ref 2.0–7.5)
NEUTROPHILS RELATIVE PERCENT: 56.2 %
NEUTROPHILS RELATIVE PERCENT: 54.9 %
PLATELET COUNT: 6 10*9/L — CL (ref 150–440)
RED BLOOD CELL COUNT: 2.5 10*12/L — ABNORMAL LOW (ref 4.00–5.20)
RED BLOOD CELL COUNT: 2.49 10*12/L — ABNORMAL LOW (ref 4.00–5.20)
RED CELL DISTRIBUTION WIDTH: 12.7 % (ref 12.0–15.0)
RED CELL DISTRIBUTION WIDTH: 12.5 % (ref 12.0–15.0)
WBC ADJUSTED: 0.6 10*9/L — ABNORMAL LOW (ref 4.5–11.0)
WBC ADJUSTED: 0.8 10*9/L — ABNORMAL LOW (ref 4.5–11.0)

## 2020-02-22 LAB — ALKALINE PHOSPHATASE: Alkaline phosphatase:CCnc:Pt:Ser/Plas:Qn:: 124 — ABNORMAL HIGH

## 2020-02-22 LAB — HEPATIC FUNCTION PANEL
ALBUMIN: 3.3 g/dL — ABNORMAL LOW (ref 3.4–5.0)
ALKALINE PHOSPHATASE: 124 U/L — ABNORMAL HIGH (ref 46–116)
AST (SGOT): 20 U/L (ref <=34)
BILIRUBIN TOTAL: 0.4 mg/dL (ref 0.3–1.2)

## 2020-02-22 LAB — SLIDE REVIEW

## 2020-02-22 LAB — BASOPHILS ABSOLUTE COUNT: Basophils:NCnc:Pt:Bld:Qn:Automated count: 0

## 2020-02-22 LAB — SODIUM: Sodium:SCnc:Pt:Ser/Plas:Qn:: 142

## 2020-02-22 LAB — MAGNESIUM: Magnesium:MCnc:Pt:Ser/Plas:Qn:: 1.5 — ABNORMAL LOW

## 2020-02-22 LAB — PLATELET COUNT: Platelets:NCnc:Pt:Bld:Qn:Automated count: 18 — ABNORMAL LOW

## 2020-02-22 LAB — SMEAR REVIEW

## 2020-02-22 LAB — HEMATOCRIT: Hematocrit:VFr:Pt:Bld:Qn:: 19.9 — ABNORMAL LOW

## 2020-02-22 MED ORDER — POSACONAZOLE 100 MG TABLET,DELAYED RELEASE
ORAL_TABLET | Freq: Every day | ORAL | 1 refills | 30.00000 days | Status: CP
Start: 2020-02-22 — End: ?
  Filled 2020-02-23: qty 90, 30d supply, fill #0

## 2020-02-22 MED ORDER — AMLODIPINE 5 MG TABLET
ORAL_TABLET | Freq: Every day | ORAL | 2 refills | 30.00000 days | Status: CP
Start: 2020-02-22 — End: ?
  Filled 2020-02-23: qty 30, 30d supply, fill #0

## 2020-02-22 MED ORDER — ELTROMBOPAG 50 MG TABLET
ORAL_TABLET | Freq: Every day | ORAL | 2 refills | 30 days | Status: CP
Start: 2020-02-22 — End: ?

## 2020-02-22 MED ORDER — LORATADINE 10 MG TABLET
ORAL_TABLET | Freq: Every day | ORAL | 2 refills | 30.00000 days | Status: CN
Start: 2020-02-22 — End: 2021-02-21

## 2020-02-22 MED ADMIN — ondansetron (ZOFRAN-ODT) disintegrating tablet 8 mg: 8 mg | ORAL | @ 13:00:00

## 2020-02-22 MED ADMIN — amLODIPine (NORVASC) tablet 5 mg: 5 mg | ORAL | @ 13:00:00

## 2020-02-22 MED ADMIN — cefdinir (OMNICEF) capsule 300 mg: 300 mg | ORAL | @ 01:00:00 | Stop: 2020-03-18

## 2020-02-22 MED ADMIN — vortioxetine (TRINTELLIX) tablet 10 mg: 10 mg | ORAL | @ 01:00:00

## 2020-02-22 MED ADMIN — polyethylene glycol (MIRALAX) packet 17 g: 17 g | ORAL | @ 13:00:00

## 2020-02-22 MED ADMIN — cefdinir (OMNICEF) capsule 300 mg: 300 mg | ORAL | @ 13:00:00 | Stop: 2020-03-18

## 2020-02-22 MED ADMIN — zolpidem (AMBIEN) tablet 5 mg: 5 mg | ORAL | @ 04:00:00

## 2020-02-22 MED ADMIN — tacrolimus (PROGRAF) 1.5mg combo product: 1.5 mg | ORAL | @ 01:00:00

## 2020-02-22 MED ADMIN — magnesium oxide (MAG-OX) tablet 800 mg: 800 mg | ORAL | @ 09:00:00

## 2020-02-22 MED ADMIN — valACYclovir (VALTREX) tablet 500 mg: 500 mg | ORAL | @ 13:00:00

## 2020-02-22 MED ADMIN — sodium chloride 0.9% (NS) bolus 1,000 mL: 1000 mL | INTRAVENOUS | @ 17:00:00 | Stop: 2020-02-22

## 2020-02-22 MED ADMIN — lithium (LITHOBID) ER tablet 300 mg: 300 mg | ORAL | @ 01:00:00

## 2020-02-22 NOTE — Unmapped (Signed)
Pt doing well today, completed pentamidine this afternoon. CVAD teaching complete with husband. Pending discharge tomorrow  Problem: Adult Inpatient Plan of Care  Goal: Plan of Care Review  Outcome: Ongoing - Unchanged  Goal: Patient-Specific Goal (Individualization)  Outcome: Ongoing - Unchanged  Goal: Absence of Hospital-Acquired Illness or Injury  Outcome: Ongoing - Unchanged  Goal: Optimal Comfort and Wellbeing  Outcome: Ongoing - Unchanged  Goal: Readiness for Transition of Care  Outcome: Ongoing - Unchanged  Goal: Rounds/Family Conference  Outcome: Ongoing - Unchanged

## 2020-02-22 NOTE — Unmapped (Signed)
Pt is day + 39 s/p allo-HSCT. Vitals: WNL Nausea/Vomiting: PRN Zofran requested prior to taking scheduled medications Diarrhea: Denies  Blood/Electrolyte Replacements: Mg replaced per orders        Problem: Adult Inpatient Plan of Care  Goal: Plan of Care Review  02/22/2020 0629 by Darryl Nestle, RN  Outcome: Ongoing - Unchanged  02/22/2020 0629 by Darryl Nestle, RN  Outcome: Ongoing - Unchanged  Goal: Patient-Specific Goal (Individualization)  02/22/2020 0629 by Darryl Nestle, RN  Outcome: Ongoing - Unchanged  02/22/2020 0629 by Darryl Nestle, RN  Outcome: Ongoing - Unchanged  Goal: Absence of Hospital-Acquired Illness or Injury  02/22/2020 0629 by Darryl Nestle, RN  Outcome: Ongoing - Unchanged  02/22/2020 0629 by Darryl Nestle, RN  Outcome: Ongoing - Unchanged  Goal: Optimal Comfort and Wellbeing  02/22/2020 0629 by Darryl Nestle, RN  Outcome: Ongoing - Unchanged  02/22/2020 0629 by Darryl Nestle, RN  Outcome: Ongoing - Unchanged  Goal: Readiness for Transition of Care  02/22/2020 0629 by Darryl Nestle, RN  Outcome: Ongoing - Unchanged  02/22/2020 0629 by Darryl Nestle, RN  Outcome: Ongoing - Unchanged  Goal: Rounds/Family Conference  02/22/2020 0629 by Darryl Nestle, RN  Outcome: Ongoing - Unchanged  02/22/2020 0629 by Darryl Nestle, RN  Outcome: Ongoing - Unchanged     Problem: Infection  Goal: Infection Symptom Resolution  02/22/2020 0629 by Darryl Nestle, RN  Outcome: Ongoing - Unchanged  02/22/2020 0629 by Darryl Nestle, RN  Outcome: Ongoing - Unchanged     Problem: Fatigue (Stem Cell/Bone Marrow Transplant)  Goal: Energy Level Supports Daily Activity  02/22/2020 0629 by Darryl Nestle, RN  Outcome: Ongoing - Unchanged  02/22/2020 0629 by Darryl Nestle, RN  Outcome: Ongoing - Unchanged     Problem: Hematologic Alteration (Stem Cell/Bone Marrow Transplant)  Goal: Blood Counts Within Acceptable Range  02/22/2020 0629 by Darryl Nestle, RN  Outcome: Ongoing - Unchanged  02/22/2020 0629 by Darryl Nestle, RN Outcome: Ongoing - Unchanged     Problem: Nausea and Vomiting (Stem Cell/Bone Marrow Transplant)  Goal: Nausea and Vomiting Symptom Relief  02/22/2020 0629 by Darryl Nestle, RN  Outcome: Ongoing - Unchanged  02/22/2020 0629 by Darryl Nestle, RN  Outcome: Ongoing - Unchanged     Problem: Nutrition Intake Altered (Stem Cell/Bone Marrow Transplant)  Goal: Optimal Nutrition Intake  02/22/2020 0629 by Darryl Nestle, RN  Outcome: Ongoing - Unchanged  02/22/2020 0629 by Darryl Nestle, RN  Outcome: Ongoing - Unchanged

## 2020-02-22 NOTE — Unmapped (Signed)
BMT-CT Inpatient Progress Note    Patient Name: Susan Kane  MRN: [id number]  Encounter Date: 02/22/20     Referring Physician: Dr. Jillyn Hidden Nida Boatman) [name]  Primary Care Provider: Thora Lance, MD  BMT Attending MD: Dr. Oswald Hillock    Disease: CLL  Current disease status: SD (stable disease)  Type of Transplant: RIC MUD Allo  Graft Source: Cryopreserved PBSCs  Transplant Day: D+39 (02/22/20)    Study Participant: BMT CTN 1704: Composite Health Assessment Risk Model for Older Adults: Applying Pre-transplant Comorbidity, Geriatric Assessment and Biomarkers to Predict Non-Relapse Mortality After Allogeneic Transplant (CHARM).    Susan Kane is a 59 y.o. 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     Interval History:  No acute events overnight. ANC slightly lower at 0.3 today. She feels well overall but is disappointed that she will not be discharging. Potential discharge on Monday. Denies symptoms of fevers, chills, dyspnea, cough, chest pain, nausea, vomiting, abdominal pain, diarrhea.    Review of Systems:  A comprehensive review of systems was negative except for pertinent positives noted above.     Reviewed and updated past medical, surgical, social, and family history as appropriate.     Objective:  Temp:  [36.7 ??C (98.1 ??F)-37.1 ??C (98.7 ??F)] 37.1 ??C (98.7 ??F)  Heart Rate:  [82-96] 82  Resp:  [16] 16  BP: (106-122)/(55-65) 106/56  MAP (mmHg):  [73-83] 73  SpO2:  [95 %-97 %] 97 %   Vitals:    02/20/20 2051 02/21/20 2110   Weight: 55.5 kg (122 lb 6.4 oz) 55.9 kg (123 lb 4.8 oz)      80, Normal activity with effort; some signs or symptoms of disease (ECOG equivalent 1)    Physical Exam:  General : No acute distress noted.   Central venous access: Line clean, dry, intact. No erythema or drainage noted.   ENT: Moist mucous membranes. Oropharynx without lesions, erythema or exudate. Cardiovascular: Regular rate and rhythm. Normal S1 and S2, without any murmur, rub, or gallop.  Lungs: Clear to auscultation. Normal work of breathing.  Skin: Warm, dry, intact. No rash noted.   GI: Normoactive bowel sounds, abdomen soft, non-tender   Extremeties: No edema.   Neurologic: Alert and oriented to person, place, and time. Normal strength and sensation throughout     Test Results: I personally reviewed these labs.  WBC   Date Value Ref Range Status   02/22/2020 0.6 (L) 4.5 - 11.0 10*9/L Final   02/07/2020 <0.1 (LL) 4.5 - 11.0 10*9/L Final     Comment:     WBC count insufficient for precise differential.      HGB   Date Value Ref Range Status   02/22/2020 7.2 (L) 12.0 - 16.0 g/dL Final     HCT   Date Value Ref Range Status   02/22/2020 19.9 (L) 36.0 - 46.0 % Final     Platelet   Date Value Ref Range Status   02/22/2020 11 (L) 150 - 440 10*9/L Final     Absolute Neutrophils   Date Value Ref Range Status   02/22/2020 0.3 (LL) 2.0 - 7.5 10*9/L Final     Absolute Eosinophils   Date Value Ref Range Status   02/22/2020 0.0 0.0 - 0.4 10*9/L Final     Sodium   Date Value Ref Range Status   02/22/2020 142  135 - 145 mmol/L Final     Potassium   Date Value Ref Range Status   02/22/2020 4.1 3.4 - 4.5 mmol/L Final     Chloride   Date Value Ref Range Status   02/22/2020 110 (H) 98 - 107 mmol/L Final     CO2   Date Value Ref Range Status   02/22/2020 27.0 20.0 - 31.0 mmol/L Final     BUN   Date Value Ref Range Status   02/22/2020 15 9 - 23 mg/dL Final     Creatinine   Date Value Ref Range Status   02/22/2020 0.90 (H) 0.60 - 0.80 mg/dL Final     Glucose   Date Value Ref Range Status   02/22/2020 137 70 - 179 mg/dL Final     Calcium   Date Value Ref Range Status   02/22/2020 9.2 8.7 - 10.4 mg/dL Final     Magnesium   Date Value Ref Range Status   02/22/2020 1.5 (L) 1.6 - 2.6 mg/dL Final     Total Bilirubin   Date Value Ref Range Status   02/22/2020 0.4 0.3 - 1.2 mg/dL Final     Total Protein   Date Value Ref Range Status 02/22/2020 5.5 (L) 5.7 - 8.2 g/dL Final     Albumin   Date Value Ref Range Status   02/22/2020 3.3 (L) 3.4 - 5.0 g/dL Final     ALT   Date Value Ref Range Status   02/22/2020 55 (H) 10 - 49 U/L Final     AST   Date Value Ref Range Status   02/22/2020 20 <=34 U/L Final     Alkaline Phosphatase   Date Value Ref Range Status   02/22/2020 124 (H) 46 - 116 U/L Final     LDH   Date Value Ref Range Status   02/19/2020 196 120 - 246 U/L Final      Assessment/Plan:    BMT: SD (stable disease)  HCT-CI (age adjusted) 5 (active CLL, psych, age adjusted)   Conditioning:  1. Rituximab 375 mg/m2 on day -13 and 1000 mg/m2 on D-6, +1, and +8  2. Bendamustine 130 mg/m2 IV daily over 10 minutes on D-5 through -3  3. Fludarabine 30 mg/m2 IV daily over 30 minutes on D-5 through -3  ??  Donor: 10/10, ABO ), CMV negative  Engraftment: Granix starting D+12 through neutrophil recovery (as defined as ANC 1.0 x 2 days or 3.0 x 1 day)  - Date of last granix injection: TBD  - D22 no engraftment,  - Viral studies showed weakly positive EBV, CMV, HHV-6, plan to monitor for now  - BMBx results showed hypocellular marrow without evidence of hematopiesis. Chimerism studies are still pending  - Repeat viral studies on 7/26  - Started eltrombopag 50 mg daily on 7/27, increased dose to 75 mg daily on 7/30    GvHD prophylaxis:   1. Tacrolimus 0.045 mg/kg PO BID starting D-3 followed by 0.03 mg/kg PO BID starting on D-1 with a goal of 5-10 ng/mL  ????** Current dose 1/0.5mg  BID  2. Methotrexate 5 mg/m2 on D+1, +3, +6 and +11  3. rATG 1 mg/kg on D-2 and -1 included  ??  Heme:   - Transfusion criteria: Transfuse 1 unit of PRBCs for Hgb<7 and 1 unit of platelets for Plt <10K or bleeding. No history of transfusion reactions.     ID:   Prophylaxis:  - Antiviral: Continue Valtrex 500 mg po daily on  admit  - Antifungal: Posaconazole 300 mg daily  - Antibacterial: Will use cefdinir 300 BID for prophy  ????** history of nausea with levofloxacin when given with CNI and higher dose of Trintellix, so unclear which agent caused nausea. Patient ok with re-challenging with levofloxacin with plan to switch to cefdinir if she develops significant nausea while on levofloxacin  - PJP: Bactrim DS once daily MWF upon platelet engraftment.  S/p pentamidine on 02/21/20.    ??  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 Repeat CMV IgG on 01/07/2020 was once again negative.  ??  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   ??  CINV:   - Anti-emetics per BMT protocol.   - Zofran works well for her, scheduled ODT q 8 hr (6/22)  - Nightly zyprexa (added 7/2)  ??  Diarrhea: Resolved.     Constipation: Changing miralax to PRN, reducing senna to 1 tab at night.    Renal:   Non-oliguric AKI: Cr increased to 1.16 from baseline of ~ 0.7. Likely pre-renal in the setting of poor PO intake. No history of urinary retention/obstruction.  - Follow up tacro levels  - s/p 1u pRBCs and NS, assess response  - Follow up urine studies  - Strict I&O, avoid nephrotoxins  ??  FEN:  Electrolyte replacement per protocol  - Continue folic acid.  - Will monitor PO fluid intake and administer IVF as necessary  ??  Endocrine:  Hypothyroid:??  - Continue Synthroid daily.     Hepatic:   - VOD prophylaxis not warranted as non-myeloablative regimen.     Hepatocellular liver injury pattern:  - Mild increase in AST, ALT, and AlkPhos, likely secondary to posaconazole  - Follow up repeat viral studies on Monday     CV:   HTN: Improved on 5mg  amlodipine.     Pulm:   AHRF: First noted on 7/22. Intermittent O2 requirement with exam findings concerning for LLL crackles. No signs of volume overload on exam.  - Chest-X ray x2 clear  - Assess O2 requirement with walk test  - Consider CT Chest w/o contrast if persistent    Neuro/Pain:   - Oxycodone 5 mg po prn.     Psych: Has a home psychiatrist.   Depression/Anxiety:   - Continue Xanax 0.25 mg BID PRN for anxiety  - Continue Lithium 300mg  nightly  - Continue Trinellix 10mg  nightly (patient will bring her home supply).  - Psych consult PRN.   ??  Insomnia:  - Ambien 5mg  nightly.   ??  Caregiver/Lodging:  - Husband [name] will be primary caregiver  - Plan to stay in local rental post transplant??    Plan Summary:  - Potential plan to discharge tomorrow if her counts continue to be (ANC>/=0.5)  - Discontinue Cefdinir if ANC consistently >/= 0.5  - Given slow increase in Cr to 0.9 will give 1L IVFs today and encourage PO intake    Esperanza Richters, MD  Hematology/Oncology Fellow  [facility]row [facility]

## 2020-02-23 DIAGNOSIS — C9112 Chronic lymphocytic leukemia of B-cell type in relapse: Principal | ICD-10-CM

## 2020-02-23 LAB — CALCIUM IONIZED VENOUS (MG/DL): Calcium.ionized:MCnc:Pt:Bld:Qn:: 4.87

## 2020-02-23 LAB — BASIC METABOLIC PANEL
BLOOD UREA NITROGEN: 12 mg/dL (ref 9–23)
BUN / CREAT RATIO: 16
CALCIUM: 9.3 mg/dL (ref 8.7–10.4)
CHLORIDE: 111 mmol/L — ABNORMAL HIGH (ref 98–107)
CO2: 27 mmol/L (ref 20.0–31.0)
CREATININE: 0.74 mg/dL
EGFR CKD-EPI AA FEMALE: >90 mL/min/{1.73_m2} (ref >=60)
EGFR CKD-EPI NON-AA FEMALE: 88 mL/min/{1.73_m2} (ref >=60)
POTASSIUM: 3.8 mmol/L (ref 3.4–4.5)
SODIUM: 143 mmol/L (ref 135–145)

## 2020-02-23 LAB — CBC W/ AUTO DIFF
BASOPHILS ABSOLUTE COUNT: 0 10*9/L (ref 0.0–0.1)
BASOPHILS RELATIVE PERCENT: 0.7 %
EOSINOPHILS ABSOLUTE COUNT: 0 10*9/L (ref 0.0–0.4)
EOSINOPHILS RELATIVE PERCENT: 0 %
HEMATOCRIT: 18 % — ABNORMAL LOW (ref 36.0–46.0)
HEMOGLOBIN: 6.5 g/dL — ABNORMAL LOW (ref 12.0–16.0)
LYMPHOCYTES ABSOLUTE COUNT: 0.1 10*9/L — ABNORMAL LOW (ref 1.5–5.0)
LYMPHOCYTES RELATIVE PERCENT: 8.9 %
MEAN CORPUSCULAR HEMOGLOBIN CONC: 36.2 g/dL (ref 31.0–37.0)
MEAN CORPUSCULAR HEMOGLOBIN: 28.8 pg (ref 26.0–34.0)
MEAN CORPUSCULAR VOLUME: 79.7 fL — ABNORMAL LOW (ref 80.0–100.0)
MEAN PLATELET VOLUME: 9.1 fL (ref 7.0–10.0)
MONOCYTES RELATIVE PERCENT: 16.3 %
NEUTROPHILS ABSOLUTE COUNT: 0.6 10*9/L — ABNORMAL LOW (ref 2.0–7.5)
NEUTROPHILS RELATIVE PERCENT: 59.3 %
PLATELET COUNT: 18 10*9/L — ABNORMAL LOW (ref 150–440)
RED BLOOD CELL COUNT: 2.26 10*12/L — ABNORMAL LOW (ref 4.00–5.20)
RED CELL DISTRIBUTION WIDTH: 12.5 % (ref 12.0–15.0)
WBC ADJUSTED: 1 10*9/L — ABNORMAL LOW (ref 4.5–11.0)

## 2020-02-23 LAB — HEPATIC FUNCTION PANEL
ALBUMIN: 3.3 g/dL — ABNORMAL LOW (ref 3.4–5.0)
ALKALINE PHOSPHATASE: 126 U/L — ABNORMAL HIGH (ref 46–116)
BILIRUBIN DIRECT: 0.2 mg/dL (ref 0.00–0.30)
BILIRUBIN TOTAL: 0.6 mg/dL (ref 0.3–1.2)
PROTEIN TOTAL: 5.8 g/dL (ref 5.7–8.2)

## 2020-02-23 LAB — APTT
APTT: 46.4 s — ABNORMAL HIGH (ref 24.9–36.9)
Coagulation surface induced:Time:Pt:PPP:Qn:Coag: 46.4 — ABNORMAL HIGH

## 2020-02-23 LAB — LACTATE DEHYDROGENASE: Lactate dehydrogenase:CCnc:Pt:Ser/Plas:Qn:Reaction: pyruvate to lactate: 207

## 2020-02-23 LAB — PROTIME: Coagulation tissue factor induced:Time:Pt:PPP:Qn:Coag: 17.6 — ABNORMAL HIGH

## 2020-02-23 LAB — HEMOGLOBIN: Hemoglobin:MCnc:Pt:Bld:Qn:: 6.5 — ABNORMAL LOW

## 2020-02-23 LAB — PHOSPHORUS: Phosphate:MCnc:Pt:Ser/Plas:Qn:: 3.9

## 2020-02-23 LAB — MAGNESIUM: Magnesium:MCnc:Pt:Ser/Plas:Qn:: 1.4 — ABNORMAL LOW

## 2020-02-23 LAB — TACROLIMUS, TROUGH: Lab: 7.6

## 2020-02-23 LAB — CHLORIDE: Chloride:SCnc:Pt:Ser/Plas:Qn:: 111 — ABNORMAL HIGH

## 2020-02-23 LAB — BILIRUBIN TOTAL: Bilirubin:MCnc:Pt:Ser/Plas:Qn:: 0.6

## 2020-02-23 LAB — IONIZED CALCIUM VENOUS: CALCIUM IONIZED VENOUS (MG/DL): 4.87 mg/dL (ref 4.40–5.40)

## 2020-02-23 MED ORDER — ZOLPIDEM 10 MG TABLET
ORAL_TABLET | Freq: Every evening | ORAL | 0 refills | 20 days | Status: CP | PRN
Start: 2020-02-23 — End: ?
  Filled 2020-02-23: qty 20, 20d supply, fill #0

## 2020-02-23 MED ORDER — TRINTELLIX 10 MG TABLET
ORAL_TABLET | Freq: Every evening | ORAL | 5 refills | 30 days | Status: CP
Start: 2020-02-23 — End: ?
  Filled 2020-02-23: qty 30, 30d supply, fill #0

## 2020-02-23 MED ORDER — ONDANSETRON 8 MG DISINTEGRATING TABLET
ORAL_TABLET | Freq: Three times a day (TID) | ORAL | 2 refills | 10.00000 days | Status: CP | PRN
Start: 2020-02-23 — End: ?
  Filled 2020-02-23: qty 30, 10d supply, fill #0

## 2020-02-23 MED ORDER — LITHIUM CARBONATE ER 300 MG TABLET,EXTENDED RELEASE
ORAL_TABLET | Freq: Every evening | ORAL | 5 refills | 30.00000 days | Status: CP
Start: 2020-02-23 — End: ?
  Filled 2020-03-03: qty 30, 30d supply, fill #0

## 2020-02-23 MED ORDER — FOLIC ACID 1 MG TABLET
ORAL_TABLET | Freq: Every evening | ORAL | 5 refills | 30 days | Status: CP
Start: 2020-02-23 — End: ?
  Filled 2020-02-23: qty 30, 30d supply, fill #0

## 2020-02-23 MED ORDER — LEVOTHYROXINE 100 MCG TABLET
ORAL_TABLET | Freq: Every day | ORAL | 5 refills | 30.00000 days | Status: CP
Start: 2020-02-23 — End: ?
  Filled 2020-02-23: qty 30, 30d supply, fill #0

## 2020-02-23 MED ORDER — VALACYCLOVIR 500 MG TABLET
ORAL_TABLET | Freq: Every day | ORAL | 11 refills | 30 days | Status: CP
Start: 2020-02-23 — End: ?
  Filled 2020-02-23: qty 30, 30d supply, fill #0

## 2020-02-23 MED ORDER — CEFDINIR 300 MG CAPSULE
ORAL_CAPSULE | Freq: Two times a day (BID) | ORAL | 0 refills | 15.00000 days | Status: CP
Start: 2020-02-23 — End: ?
  Filled 2020-02-23: qty 30, 15d supply, fill #0

## 2020-02-23 MED ADMIN — tacrolimus (PROGRAF) 1.5mg combo product: 1.5 mg | ORAL

## 2020-02-23 MED ADMIN — ondansetron (ZOFRAN-ODT) disintegrating tablet 8 mg: 8 mg | ORAL

## 2020-02-23 MED ADMIN — levothyroxine (SYNTHROID) tablet 100 mcg: 100 ug | ORAL | @ 09:00:00 | Stop: 2020-02-23

## 2020-02-23 MED ADMIN — lithium (LITHOBID) ER tablet 300 mg: 300 mg | ORAL

## 2020-02-23 MED ADMIN — cefdinir (OMNICEF) capsule 300 mg: 300 mg | ORAL | Stop: 2020-03-18

## 2020-02-23 MED ADMIN — famotidine (PEPCID) tablet 20 mg: 20 mg | ORAL | @ 13:00:00 | Stop: 2020-02-23

## 2020-02-23 MED ADMIN — amLODIPine (NORVASC) tablet 5 mg: 5 mg | ORAL | @ 13:00:00 | Stop: 2020-02-23

## 2020-02-23 MED ADMIN — folic acid (FOLVITE) tablet 1 mg: 1 mg | ORAL

## 2020-02-23 MED ADMIN — famotidine (PEPCID) tablet 20 mg: 20 mg | ORAL

## 2020-02-23 MED ADMIN — ondansetron (ZOFRAN-ODT) disintegrating tablet 8 mg: 8 mg | ORAL | @ 13:00:00 | Stop: 2020-02-23

## 2020-02-23 MED FILL — AMLODIPINE 5 MG TABLET: 30 days supply | Qty: 30 | Fill #0 | Status: AC

## 2020-02-23 MED FILL — VALACYCLOVIR 500 MG TABLET: 30 days supply | Qty: 30 | Fill #0 | Status: AC

## 2020-02-23 MED FILL — TRINTELLIX 10 MG TABLET: 30 days supply | Qty: 30 | Fill #0 | Status: AC

## 2020-02-23 MED FILL — CEFDINIR 300 MG CAPSULE: 15 days supply | Qty: 30 | Fill #0 | Status: AC

## 2020-02-23 MED FILL — OLANZAPINE 5 MG DISINTEGRATING TABLET: 30 days supply | Qty: 30 | Fill #0 | Status: AC

## 2020-02-23 MED FILL — FOLIC ACID 1 MG TABLET: 30 days supply | Qty: 30 | Fill #0 | Status: AC

## 2020-02-23 MED FILL — FAMOTIDINE 20 MG TABLET: 30 days supply | Qty: 60 | Fill #0 | Status: AC

## 2020-02-23 MED FILL — MG-PLUS-PROTEIN 133 MG TABLET: 50 days supply | Qty: 100 | Fill #0 | Status: AC

## 2020-02-23 MED FILL — ZOLPIDEM 10 MG TABLET: 20 days supply | Qty: 20 | Fill #0 | Status: AC

## 2020-02-23 MED FILL — POSACONAZOLE 100 MG TABLET,DELAYED RELEASE: 30 days supply | Qty: 90 | Fill #0 | Status: AC

## 2020-02-23 MED FILL — ONDANSETRON 8 MG DISINTEGRATING TABLET: 10 days supply | Qty: 30 | Fill #0 | Status: AC

## 2020-02-23 MED FILL — LEVOTHYROXINE 100 MCG TABLET: 30 days supply | Qty: 30 | Fill #0 | Status: AC

## 2020-02-23 NOTE — Unmapped (Signed)
Pt discharging    Problem: Adult Inpatient Plan of Care  Goal: Plan of Care Review  02/23/2020 1130 by Sarina Ser, RN  Outcome: Discharged to Home  02/23/2020 1130 by Sarina Ser, RN  Outcome: Progressing  02/23/2020 1125 by Sarina Ser, RN  Outcome: Progressing  Goal: Patient-Specific Goal (Individualization)  02/23/2020 1130 by Sarina Ser, RN  Outcome: Discharged to Home  02/23/2020 1130 by Sarina Ser, RN  Outcome: Progressing  02/23/2020 1125 by Sarina Ser, RN  Outcome: Progressing  Goal: Absence of Hospital-Acquired Illness or Injury  02/23/2020 1130 by Sarina Ser, RN  Outcome: Discharged to Home  02/23/2020 1130 by Sarina Ser, RN  Outcome: Progressing  02/23/2020 1125 by Sarina Ser, RN  Outcome: Progressing  Goal: Optimal Comfort and Wellbeing  02/23/2020 1130 by Sarina Ser, RN  Outcome: Discharged to Home  02/23/2020 1130 by Sarina Ser, RN  Outcome: Progressing  02/23/2020 1125 by Sarina Ser, RN  Outcome: Progressing  Goal: Readiness for Transition of Care  02/23/2020 1130 by Sarina Ser, RN  Outcome: Discharged to Home  02/23/2020 1130 by Sarina Ser, RN  Outcome: Progressing  02/23/2020 1125 by Sarina Ser, RN  Outcome: Progressing  Goal: Rounds/Family Conference  02/23/2020 1130 by Sarina Ser, RN  Outcome: Discharged to Home  02/23/2020 1130 by Sarina Ser, RN  Outcome: Progressing  02/23/2020 1125 by Sarina Ser, RN  Outcome: Progressing

## 2020-02-23 NOTE — Unmapped (Signed)
BMTCTP Discharge Summary    Admit date: 01/09/2020  Discharge date: 02/23/20  Discharge to: Home  Discharge Service: MDT    Procedures: BM Stem cell infusion, 01/14/20  Discharge diagnosis/complications: Prolonged neutropenia with slow engraftment.    Referring Physician: Dr. Jillyn Hidden Nida Boatman) [name]  Primary Care Provider: Thora Lance, MD  BMT Attending MD: Dr. Oswald Hillock  ??  Disease: CLL  Current disease status: SD (stable disease)  Type of Transplant: RIC MUD Allo  Graft Source: Cryopreserved PBSCs  Transplant Day: D+40 (02/23/20)  ??  Study Participant: BMT CTN 1704: Composite Health Assessment Risk Model for Older Adults: Applying Pre-transplant Comorbidity, Geriatric Assessment and Biomarkers to Predict Non-Relapse Mortality After Allogeneic Transplant (CHARM).  ??  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    Interval History:   No acute events overnight. Normal PO intake. Denies any recent illnesses or sick contacts. Denies cough, congestion, shortness of breath, wheezing, or sore throat. Denies headaches, lightheadedness, weakness. Denies fever, chills, weight loss, night sweats. Denies abdominal pain, nausea, vomiting, constipation, or diarrhea.    ROS:  Comprehensive ROS negative except pertinent positives listed in interval history.     Physical exam:  Vitals:    02/23/20 0800   BP: 125/73   Pulse: 92   Resp: 18   Temp: 36.8 ??C (98.3 ??F)   SpO2: 97%     Vitals:    02/21/20 2110 02/22/20 2046   Weight: 55.9 kg (123 lb 4.8 oz) 56 kg (123 lb 8 oz)       KPS at discharge: 90,  Able to carry on normal activity; minor signs or symptoms of disease (ECOG equivalent 0)    General : No acute distress noted.   Central venous access: Line clean, dry, intact. No erythema or drainage noted.   ENT: Moist mucous membranes. Oropharhynx without lesions, erythema or exudate. Cardiovascular: Pulse normal rate, regularity and rhythm. S1 and S2 normal, without any murmur, rub, or gallop.  Lungs: Clear to auscultation bilateraly, without wheezes/crackles/rhonchi. Good air movement.   Skin: Warm, dry, intact. No rash noted.   Psychiatry: Alert and oriented to person, place, and time.   GI: Normoactive bowel sounds, abdomen soft, non-tender   Extremeties: No edema.   Musculoskeletal/Extremities: Full range of motion in shoulder, elbow, hip knee, ankle, left hand and feet.  Neurologic: CNII-XII intact. Normal strength and sensation throughout    Lab Results   Component Value Date    WBC 1.0 (L) 02/23/2020    HGB 6.5 (L) 02/23/2020    HCT 18.0 (L) 02/23/2020    PLT 18 (L) 02/23/2020     Lab Results   Component Value Date    NA 143 02/23/2020    K 3.8 02/23/2020    CL 111 (H) 02/23/2020    CO2 27.0 02/23/2020    BUN 12 02/23/2020    CREATININE 0.74 02/23/2020    GLU 127 02/23/2020    CALCIUM 9.3 02/23/2020    MG 1.4 (L) 02/23/2020    PHOS 3.9 02/23/2020     Lab Results   Component Value Date    BILITOT 0.6 02/23/2020    BILIDIR 0.20 02/23/2020    PROT 5.8 02/23/2020    ALBUMIN 3.3 (L) 02/23/2020    ALT 52 (H) 02/23/2020    AST 24 02/23/2020    ALKPHOS 126 (H) 02/23/2020  GGT 22 01/09/2020     Lab Results   Component Value Date    PT 17.6 (H) 02/23/2020    INR 1.51 02/23/2020    APTT 46.4 (H) 02/23/2020         Hospital Course/Assessment and Plan    BMT:??SD (stable disease)  HCT-CI (age adjusted) 5??(active CLL, psych, age adjusted)   Conditioning:  1. Rituximab 375 mg/m2 on day -13 and 1000 mg/m2 on D-6, +1, and +8  2. Bendamustine 130 mg/m2 IV daily over 10 minutes on D-5 through -3  3. Fludarabine 30 mg/m2 IV daily over 30 minutes on D-5 through -3  ??  Donor: 10/10, ABO ), CMV negative  Engraftment: Granix starting D+12 through neutrophil recovery (as defined as ANC 1.0 x 2 days or 3.0 x 1 day)  - Date of last granix injection: TBD  - D22 no engraftment,  - Viral studies showed weakly positive EBV, CMV, HHV-6, plan to monitor for now  - BMBx results showed hypocellular marrow without evidence of hematopiesis. Chimerism studies are still pending  - Repeat viral studies on 7/26  - Started eltrombopag 50 mg daily on 7/27, increased dose to 75 mg daily on 7/30  ??? Date of neutrophil engraftment (first day of ANC ? 500/mm3 for 3 consecutive days): TBD  ??? Date of platelet engraftment (>20K x3 days without transfusion in 7 days): TBD  ??? Number of RBC infusions during transplant hospitalization: 11 units  ??? Number of platelet infusions during transplant hospitalization: 17 units  ??  GvHD prophylaxis:   1. Tacrolimus 0.045 mg/kg PO BID starting D-3 followed by 0.03 mg/kg PO BID starting on D-1 with a goal of 5-10 ng/mL  ????** Current dose 1/0.5mg  BID  2. Methotrexate 5 mg/m2 on D+1, +3, +6 and +11  3. rATG 1 mg/kg on D-2 and -1 included  ??  Heme:??  - Transfusion criteria: Transfuse 1 unit of PRBCs for Hgb<7 and 1 unit of platelets for Plt <10K or bleeding. No history of transfusion reactions.     ID:??  Prophylaxis:  - Antiviral: Continue Valtrex 500 mg po daily on admit  - Antifungal: Posaconazole 300 mg daily  - Antibacterial: Will use cefdinir 300 BID for prophy  ????** history of nausea with levofloxacin when given with CNI and higher dose of Trintellix, so unclear which agent caused nausea. Patient ok with re-challenging with levofloxacin with plan to switch to cefdinir if she develops significant nausea while on levofloxacin  - PJP: Bactrim DS once daily MWF upon platelet engraftment.  S/p pentamidine on 02/21/20.    ??  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 Repeat CMV IgG on 01/07/2020 was once again negative.  ??  Neutropenic Fever, resolved:  ??- Started on empiric cefepime 01/14/20-6/28, switched to cefdinir for prophylaxis as per above  ??- Blood cultures remained negative  - ANC 593 today  ??  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   ??  CINV:   - Anti-emetics per BMT protocol.   - Zofran works well for her, scheduled ODT q 8 hr (6/22)  - Nightly zyprexa (added 7/2)  ??  Diarrhea: Resolved.   ??  Constipation: Changing miralax to PRN, senna QHS    Renal:   Non-oliguric AKI: Likely pre-renal in the setting of poor PO intake. No history of urinary retention/obstruction.  -  Cr now improved to baseline of ~ 0.7  - Follow up urine studies  - Strict I&O, avoid nephrotoxins  ??  FEN: ??Electrolyte replacement per protocol  - Continue folic acid.  - Will monitor PO fluid intake and administer IVF as necessary  ??  Endocrine:  Hypothyroid:??  - Continue Synthroid daily.     Hepatic:??  - VOD prophylaxis not warranted as non-myeloablative regimen.   ??  Hepatocellular liver injury pattern:  - Mild increase in AST, ALT, and AlkPhos, likely secondary to posaconazole  - Follow up repeat viral studies on Monday     CV:   HTN: Improved on 5 mg amlodipine.     Pulm:??  AHRF: First noted on 7/22. Intermittent O2 requirement with exam findings concerning for LLL crackles. No signs of volume overload on exam.  - Chest-X ray x2 clear  - Assess O2 requirement with walk test  - Consider CT Chest w/o contrast if persistent    Neuro/Pain:??  - Oxycodone 5 mg po prn    Psych:??Has a home psychiatrist.   Depression/Anxiety:   - Continue Xanax 0.25 mg BID PRN for anxiety  - Continue Lithium 300mg  nightly  - Continue Trinellix 10mg  nightly (patient will bring her home supply).  - Psych consult PRN.   ??  Insomnia:  - Ambien 5mg  nightly.   ??  Caregiver/Lodging:  - Husband Susan Kane will be primary caregiver  - Plan to stay in local rental post transplant??  ??    Labs for first clinic visit entered: Yes    Condition at Discharge: good    I spent greater than 30 minutes in the discharge of this patient.    Milon Score, MD   [facility] Bone Marrow Transplant and Cellular Therapy Progam  Discharge Medications:      Your Medication List      STOP taking these medications    acetaminophen 325 MG tablet  Commonly known as: TYLENOL     ALPRAZolam 0.25 MG tablet  Commonly known as: XANAX        START taking these medications    amLODIPine 5 MG tablet  Commonly known as: NORVASC  Take 1 tablet (5 mg total) by mouth daily.     cefdinir 300 MG capsule  Commonly known as: OMNICEF  Take 1 capsule (300 mg total) by mouth Two (2) times a day.     eltrombopag 50 MG tablet  Commonly known as: PROMACTA  Take 2 tablets (100 mg total) by mouth daily. Administer on an empty stomach, 1 hour before or 2 hours after a meal.     famotidine 20 MG tablet  Commonly known as: PEPCID  Take 1 tablet (20 mg total) by mouth Two (2) times a day.     loratadine 10 mg tablet  Commonly known as: CLARITIN  Take 1 tablet (10 mg total) by mouth daily.     magnesium (amino acid chelate) 133 mg Tab  Generic drug: magnesium oxide-Mg AA chelate  Take 1 tablet by mouth Two (2) times a day.     OLANZapine zydis 5 MG disintegrating tablet  Commonly known as: ZyPREXA  Take 1 tablet (5 mg total) by mouth nightly.     posaconazole 100 mg Tbec delayed released tablet  Commonly known as: NOXAFIL  Take 3 tablets (300 mg total) by mouth daily.        CHANGE how you take these medications    tacrolimus 0.5 MG capsule  Commonly known as:  PROGRAF  Take 3 capsules (1.5 mg total) by mouth two (2) times a day.  What changed: how much to take     zolpidem 10 mg tablet  Commonly known as: AMBIEN  Take 1 tablet (10 mg total) by mouth nightly as needed for sleep.  What changed:   ?? when to take this  ?? reasons to take this        CONTINUE taking these medications    folic acid 1 MG tablet  Commonly known as: FOLVITE  Take 1 tablet (1 mg total) by mouth at bedtime.     levothyroxine 100 MCG tablet  Commonly known as: SYNTHROID  Take 1 tablet (100 mcg total) by mouth daily.     lithium 300 MG ER tablet  Commonly known as: LITHOBID  Take 1 tablet (300 mg total) by mouth at bedtime. ondansetron 8 MG disintegrating tablet  Commonly known as: ZOFRAN-ODT  Dissolve  1 tablet (8 mg total) by mouth every eight (8) hours as needed for nausea.     TRINTELLIX 10 mg tablet  Generic drug: vortioxetine  Take 1 tablet (10 mg total) by mouth at bedtime.     valACYclovir 500 MG tablet  Commonly known as: VALTREX  Take 1 tablet (500 mg total) by mouth daily.            Pending Test Results:   Pending Labs     Order Current Status    CMV DNA, quantitative, PCR In process    Epstein Barr Viral Load, Quant In process          Discharge Instructions:     Other Instructions     Discharge instructions      Take your temperature two times a day. If it is more than 100.5, call the BMT clinic or go to your local emergency room. This may be a medical emergency. Inform your provider that you recently received chemotherapy and had a stem cell transplant. You may have blood drawn for blood cultures and receive IV antibiotics.    Diet: Per FDA food safety guidelines or as instructed to do otherwise by your clinic provider.    Activity: Increase your activity as tolerated.     Central line site: Change your dressing once a week or if it becomes dirty, gets wet or pulls up. Call the clinic (or inpatient unit if after hours) if your catheter site is red, warm, swollen, or tender. These may be signs of infection.    Symptom management: Call the clinic (or inpatient unit if after hours) if you have nausea, vomiting, diarrhea, or pain that is not controlled with your medications. Also call if you develop a new rash.    Please call the clinic (or inpatient unit if after hours) if you have any questions or concerns.    --------  BMT clinic: [phone] this number Monday-Friday from 8 am-4:30 pm  BMT unit: [phone]-call this number after hours or holidays         Take your temperature two times a day. If it is more than 100.5, call the BMT clinic or go to your local emergency room. This may be a medical emergency. Inform your provider that you recently received chemotherapy and had a stem cell transplant. You may have blood drawn for blood cultures and receive IV antibiotics.    Diet: Per FDA food safety guidelines or as instructed to do otherwise by your clinic provider.    Activity: Increase your activity as tolerated.  Central line site: Change your dressing once a week or if it becomes dirty, gets wet or pulls up. Call the clinic (or inpatient unit if after hours) if your catheter site is red, warm, swollen, or tender. These may be signs of infection.    Symptom management: Call the clinic (or inpatient unit if after hours) if you have nausea, vomiting, diarrhea, or pain that is not controlled with your medications. Also call if you develop a new rash.    Please call the clinic (or inpatient unit if after hours) if you have any questions or concerns.    --------  BMT clinic: [phone] this number Monday-Friday from 8 am-4:30 pm  BMT unit: [phone]-call this number after hours or holidays        Follow Up instructions and Outpatient Referrals     Referral to Home Infusion      Performing location?: [facility] Requested Disciplines: Nursing     **Please contact your service pharmacist for assistance with discharge home health infusion monitoring.      Home Health/Home Infusion Orders:     Agency Details:   Infusion Agency: [facility] [phone], fax: [phone]  Infusion Start of Care Date: 02/03/2020    CVAD Dressing Change:  Please change dressing weekly/prn using sterile CVAD kit/transparent dressing.   Please change Biopatch with CVAD dressing change.   Change the stabilization device (STATLOCK) as applicable with each dressing change.  Please change needleless cap at least weekly with dressing change and prn, and after every blood draw, using aseptic technique.    Note - Generic substitutions permissible by Home Infusion agencies.    IV Access:  Line Type: Hickman                Lumens: Three  Diameter/Gauge: 12.5 Fr.  Site: Right internal jugular   Placement Date: 01/09/2020    Special Instructions:     Flush Orders:  CVAD: Broviac/Hickman/Apheresis Cath/[place]/Powerline Adult Patient Broviac/Hickman/Apheresis Cath/Powerline Adult Patient:  Flush with 5 mLs of Normal Saline before and after each use and prn line maintenance. Use after blood draws. Flush with Heparin 100units/mL 2 mLs at the end of each use and prn for line maintenance. Please change needleless cap weekly and after blood draws, or prn using aseptic technique. Please flush unused lumen(s) with Heparin 100units/mL 2 mLs Monday-Wednesday-Friday.      LABS:  To be performed in clinic      Physician to Follow Patient's Care (the person listed here will be responsible for signing ongoing orders):   Name: Lanae Boast, MD    Phone: [phone]  Fax: [phone]    Discharge instructions      Take your temperature two times a day. If it is more than 100.5, call the BMT clinic or go to your local emergency room. This may be a medical emergency. Inform your provider that you recently received chemotherapy and had a stem cell transplant. You may have blood drawn for blood cultures and receive IV antibiotics.    Diet: Per FDA food safety guidelines or as instructed to do otherwise by your clinic provider.    Activity: Increase your activity as tolerated.     Central line site: Change your dressing once a week or if it becomes dirty, gets wet or pulls up. Call the clinic (or inpatient unit if after hours) if your catheter site is red, warm, swollen, or tender. These may be signs of infection.    Symptom  management: Call the clinic (or inpatient unit if after hours) if you have nausea, vomiting, diarrhea, or pain that is not controlled with your medications. Also call if you develop a new rash.    Please call the clinic (or inpatient unit if after hours) if you have any questions or concerns.    --------  BMT clinic: [phone] this number Monday-Friday from 8 am-4:30 pm  BMT unit: [phone]-call this number after hours or holidays        Appointments which have been scheduled for you    Feb 24, 2020 10:15 AM  (Arrive by 9:45 AM)  BMT LAB with ONCBMT LABS  [facility] BMT [place] [facility] REGION) [address]  [place] [address]  [phone]      Feb 24, 2020 10:45 AM  (Arrive by 10:15 AM)  RETURN FOLLOW UP [place] with Artelia Laroche, MD  [facility] BMT [facility] REGION) [address]  [place] [address]  [phone]      Feb 24, 2020 12:30 PM  (Arrive by 12:00 PM)  BMT LEVEL 240 with [employer] CHAIR 04  [place] ONCOLOGY INFUSION [facility] REGION) [address]  [place] [address]  [phone]      Mar 24, 2020 11:45 AM  (Arrive by 11:15 AM)  BMT LAB with Carnella Guadalajara  [facility] BMT [facility] REGION) [address]  [place] [address]  [phone]      Mar 24, 2020 12:15 PM  (Arrive by 11:45 AM)  RETURN FOLLOW UP [place] with Artelia Laroche, MD  [facility] BMT [facility] REGION) [address]  [place] [address]  [phone]      Mar 24, 2020  1:30 PM  (Arrive by 1:00 PM)  BMT LEVEL 240 with [employer] CHAIR 05   [place] [facility]COLOGY INF[facility]ION [place] [facility] REGION) [address]  [place] [address]  [phone]      Apr 21, 2020 10:45 AM  (Arrive by 10:15 AM)  BMT LAB with Carnella Guadalajara  [facility] BMT [facility] REGION) [address]  [place] [address]  [phone]      Apr 21, 2020 11:15 AM  (Arrive by 10:45 AM)  RETURN FOLLOW UP [place] with ONCBMT APP B  [facility] BMT [facility] REGION) [address]  [place] [address]  [phone]      Apr 21, 2020 12:30 PM  (Arrive by 12:00 PM)  BONE MARROW BIOPSY with [employer] PROCEDURES  [place] ONCOLOGY INFUSION [facility] REGION) [address]  [place] [address]  [phone]      Apr 27, 2020 10:15 AM  (Arrive by 9:45 AM)  BMT LAB with Carnella Guadalajara  [facility] BMT [facility] REGION) [address]  [place] [address]  [phone]      Apr 27, 2020 10:45 AM  (Arrive by 10:15 AM)  RETURN FOLLOW UP [place] with Artelia Laroche, MD  [facility] BMT [facility] REGION) [address]  [place] [address]  [phone]

## 2020-02-23 NOTE — Unmapped (Signed)
Date: 02/23/2020    [facility] Home Infusion Nurse Coordinator Note: Susan Mirza, MSN, RN    RN Liaison assessment and referral coordination completed using precautionary measures in accordance with [facility] COVID-19 Pandemic emergency response plan. Patient verbalized understanding and agreement with completing this assessment via telephone or in person visit.    Patient: Susan Kane  MRN: [id number]  EDD: 02/23/2020    Address and Demographics Verified:    [address]  [place], [place] [address]    Patient Cell: [phone]  Husband: Alean Rinne [phone]  Daughter: Trinna Post [phone]     DRUG: Line Care    CVAD LINE: TL Power    Date of Medication Delivery: Day of Discharge  Other Clinical needs:   A1C:   Home Health Agency Seeing Patient: NA ??? Returning to Clinic  MD Following for [facility]ng[facility]ing [facility] Orders:   Hospital Dosing: Yes prior to D/C   Therapeutic Drug level:     Clinical Nurse Liaison Visit: Liaison spoke to patient for verification of demographics/contact information. Patient informed & made aware of Home Infusion/Home Health services, responsibilities and agreed to discharge plan of care for HH/Infusion Therapy. Patient is agreeable and open for IV therapy, and is comfortable with the infusion process and infusion device. No barriers for doing home infusion identified. Has good family support for care at home. Discussed overview of the infusion process, and introduced the type of device used in the home. Per staff RN, patient will flush lines Monday, Wednesday, & Friday, with cap change 2x per week on Wednesday and Saturday. Teaching sheet on Line Care Flush provided.    Reviewed troubleshooting tips for device and line management. Instructed to keep CVAD dressing clean, dry and intact at home. Reinforced proper care of the access device and any activity limitations.     Discussed precautions for preventing infection and other complications including: aseptic technique and hand hygiene; signs and symptoms of complications to report; who to contact for questions or nursing needs.     Reviewed supplies will arrive carrier services in a brown box. Inspect your supplies when they arrive. You will be asked to return a signed copy of the delivery ticket. Most medications sent to your home will need to be refrigerated (check label for clarification). Your Home Health Nurse will provide further education on the proper medication storage and disposal.     RN Liaison educated patient/caregiver regarding COVID-19 exposure avoidance including of importance of personal and family hygiene with frequent hand washing for 20 seconds with soap and water, avoidance of exposure to people who are sick, avoid crowds, and maintain social distancing, use proper respiratory hygiene including sneeze/cough into elbow. Wear a mask when you will be in public.     If caregivers are coming into your home to help with your care, instruct them to wear a mask and gloves. Further education provided that if patient demonstrates respiratory illness and fever, they should quarantine to one area of the home and contact their physician or this [facility] agency for additional instructions.     Follow-up Questions regarding travel outside the [place] within the last 30days: NO to all questions     - Have you traveled in the past 14 days?   - Do you have fever and/or symptoms of respiratory illness? (cough, difficulty breathing)  - Have you traveled from [place] ([place] or [address]), [place], [place], [place], [place], or [place] in the last 14 days before symptoms began?  - Have  you had close contact with a person with confirmed or suspected novel Corona Virus in the last 14 days before symptoms began?  - Do you have any of the following symptoms that are new or worsening: cough, congestion, and shortness of breath, loss of taste/smell, sore throat, fever or feeling feverish, chills, muscle pain, headache, nausea, vomiting, or diarrhea?  - Have you had close contact with a person with confirmed COVID-19 in the last 21 days before symptoms began?  - Have you tested positive in the last 21 days for COVID-19    Offer to Counsel: Do you have any questions for the Pharmacist regarding:  No  Gave patient contact numbers, and educated to call [facility] at [phone] for infusion issues and Home Health Agency for nursing questions. For life-threatening event, instructed to call 911.     Patient and Caregiver Questions: All questions were addressed or answered by Liaison. Patient/care provider had no further questions.    Information Updates Provided to: [facility] CM, [facility].    Reviewed plan: Patient verbalized understanding.

## 2020-02-23 NOTE — Unmapped (Signed)
Clinical Assessment Needed For: Dose Change  Medication: Promacta 50mg  tablets  Last Fill Date: 02/20/2020  Refill Too Soon until 03/02/20  Was previous dose already scheduled to fill: No    Notes to Pharmacist: Patient already has previous dose prescription and is discharging from the medical center today. The team has educated her on the new dose but has requested that we change her refill call date to align with her next fill. She only has 30 tablets currently = 15 day supply.

## 2020-02-23 NOTE — Unmapped (Addendum)
BMT-CT Inpatient Progress Note    Patient Name: Susan Kane  MRN: [id number]  Encounter Date: 02/23/20     Referring Physician: Dr. Jillyn Hidden Nida Boatman) [name]  Primary Care Provider: Thora Lance, MD  BMT Attending MD: Dr. Oswald Hillock    Disease: CLL  Current disease status: SD (stable disease)  Type of Transplant: RIC MUD Allo  Graft Source: Cryopreserved PBSCs  Transplant Day: D+40 (02/23/20)    Study Participant: BMT CTN 1704: Composite Health Assessment Risk Model for Older Adults: Applying Pre-transplant Comorbidity, Geriatric Assessment and Biomarkers to Predict Non-Relapse Mortality After Allogeneic Transplant (CHARM).    Susan Kane is a 59 y.o. 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     Interval History:  No acute events overnight. Denies symptoms of fevers, chills, dyspnea, cough, chest pain, nausea, vomiting, abdominal pain, diarrhea.    Review of Systems:  A comprehensive review of systems was negative except for pertinent positives noted above.     Reviewed and updated past medical, surgical, social, and family history as appropriate.     Objective:  Temp:  [36.7 ??C (98 ??F)-37.4 ??C (99.3 ??F)] 37.1 ??C (98.7 ??F)  Heart Rate:  [86-94] 87  Resp:  [16] 16  BP: (107-130)/(55-72) 128/72  MAP (mmHg):  [72-85] 85  SpO2:  [93 %-98 %] 98 %   Vitals:    02/21/20 2110 02/22/20 2046   Weight: 55.9 kg (123 lb 4.8 oz) 56 kg (123 lb 8 oz)      80, Normal activity with effort; some signs or symptoms of disease (ECOG equivalent 1)    Physical Exam:  General : No acute distress noted.   Central venous access: Line clean, dry, intact. No erythema or drainage noted.   ENT: Moist mucous membranes. Oropharynx without lesions, erythema or exudate.   Cardiovascular: Regular rate and rhythm. Normal S1 and S2, without any murmur, rub, or gallop.  Lungs: Clear to auscultation. Normal work of breathing. Skin: Warm, dry, intact. No rash noted.   GI: Normoactive bowel sounds, abdomen soft, non-tender   Extremeties: No edema.   Neurologic: Alert and oriented to person, place, and time. Normal strength and sensation throughout     Test Results: I personally reviewed these labs.  WBC   Date Value Ref Range Status   02/23/2020 1.0 (L) 4.5 - 11.0 10*9/L Final   02/07/2020 <0.1 (LL) 4.5 - 11.0 10*9/L Final     Comment:     WBC count insufficient for precise differential.      HGB   Date Value Ref Range Status   02/23/2020 6.5 (L) 12.0 - 16.0 g/dL Final     HCT   Date Value Ref Range Status   02/23/2020 18.0 (L) 36.0 - 46.0 % Final     Platelet   Date Value Ref Range Status   02/23/2020 18 (L) 150 - 440 10*9/L Final     Absolute Neutrophils   Date Value Ref Range Status   02/23/2020 0.6 (L) 2.0 - 7.5 10*9/L Final     Absolute Eosinophils   Date Value Ref Range Status   02/23/2020 0.0 0.0 - 0.4 10*9/L Final     Sodium   Date Value Ref Range Status   02/23/2020 143 135 - 145 mmol/L Final     Potassium   Date Value Ref Range Status   02/23/2020 3.8 3.4 -  4.5 mmol/L Final     Chloride   Date Value Ref Range Status   02/23/2020 111 (H) 98 - 107 mmol/L Final     CO2   Date Value Ref Range Status   02/23/2020 27.0 20.0 - 31.0 mmol/L Final     BUN   Date Value Ref Range Status   02/23/2020 12 9 - 23 mg/dL Final     Creatinine   Date Value Ref Range Status   02/23/2020 0.74 0.60 - 0.80 mg/dL Final     Glucose   Date Value Ref Range Status   02/23/2020 127 70 - 179 mg/dL Final     Calcium   Date Value Ref Range Status   02/23/2020 9.3 8.7 - 10.4 mg/dL Final     Magnesium   Date Value Ref Range Status   02/23/2020 1.4 (L) 1.6 - 2.6 mg/dL Final     Total Bilirubin   Date Value Ref Range Status   02/23/2020 0.6 0.3 - 1.2 mg/dL Final     Total Protein   Date Value Ref Range Status   02/23/2020 5.8 5.7 - 8.2 g/dL Final     Albumin   Date Value Ref Range Status   02/23/2020 3.3 (L) 3.4 - 5.0 g/dL Final     ALT   Date Value Ref Range Status 02/23/2020 52 (H) 10 - 49 U/L Final     AST   Date Value Ref Range Status   02/23/2020 24 <=34 U/L Final     Alkaline Phosphatase   Date Value Ref Range Status   02/23/2020 126 (H) 46 - 116 U/L Final     LDH   Date Value Ref Range Status   02/23/2020 207 120 - 246 U/L Final      Assessment/Plan:    BMT: SD (stable disease)  HCT-CI (age adjusted) 5 (active CLL, psych, age adjusted)   Conditioning:  1. Rituximab 375 mg/m2 on day -13 and 1000 mg/m2 on D-6, +1, and +8  2. Bendamustine 130 mg/m2 IV daily over 10 minutes on D-5 through -3  3. Fludarabine 30 mg/m2 IV daily over 30 minutes on D-5 through -3  ??  Donor: 10/10, ABO ), CMV negative  Engraftment: Granix starting D+12 through neutrophil recovery (as defined as ANC 1.0 x 2 days or 3.0 x 1 day)  - Date of last granix injection: TBD  - D22 no engraftment,  - Viral studies showed weakly positive EBV, CMV, HHV-6, plan to monitor for now  - BMBx results showed hypocellular marrow without evidence of hematopiesis. Chimerism studies are still pending  - Repeat viral studies on 7/26  - Started eltrombopag 50 mg daily on 7/27, increased dose to 75 mg daily on 7/30    GvHD prophylaxis:   1. Tacrolimus 0.045 mg/kg PO BID starting D-3 followed by 0.03 mg/kg PO BID starting on D-1 with a goal of 5-10 ng/mL  ????** Current dose 1/0.5mg  BID  2. Methotrexate 5 mg/m2 on D+1, +3, +6 and +11  3. rATG 1 mg/kg on D-2 and -1 included  ??  Heme:   - Transfusion criteria: Transfuse 1 unit of PRBCs for Hgb<7 and 1 unit of platelets for Plt <10K or bleeding. No history of transfusion reactions.     ID:   Prophylaxis:  - Antiviral: Continue Valtrex 500 mg po daily on admit  - Antifungal: Posaconazole 300 mg daily  - Antibacterial: Will use cefdinir 300 BID for prophy  ????** history of nausea with levofloxacin  when given with CNI and higher dose of Trintellix, so unclear which agent caused nausea. Patient ok with re-challenging with levofloxacin with plan to switch to cefdinir if she develops significant nausea while on levofloxacin  - PJP: Bactrim DS once daily MWF upon platelet engraftment.  S/p pentamidine on 02/21/20.    ??  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 Repeat CMV IgG on 01/07/2020 was once again negative.  ??  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   ??  CINV:   - Anti-emetics per BMT protocol.   - Zofran works well for her, scheduled ODT q 8 hr (6/22)  - Nightly zyprexa (added 7/2)  ??  Diarrhea: Resolved.     Constipation: Changing miralax to PRN, senna QHS    Renal:   Non-oliguric AKI: Likely pre-renal in the setting of poor PO intake. No history of urinary retention/obstruction.  - Cr now improved to baseline of ~ 0.7  - Follow up urine studies  - Strict I&O, avoid nephrotoxins  ??  FEN:  Electrolyte replacement per protocol  - Continue folic acid.  - Will monitor PO fluid intake and administer IVF as necessary  ??  Endocrine:  Hypothyroid:??  - Continue Synthroid daily.     Hepatic:   - VOD prophylaxis not warranted as non-myeloablative regimen.     Hepatocellular liver injury pattern:  - Mild increase in AST, ALT, and AlkPhos, likely secondary to posaconazole  - Follow up repeat viral studies on Monday     CV:   HTN: Improved on 5mg  amlodipine.     Pulm:   AHRF: First noted on 7/22. Intermittent O2 requirement with exam findings concerning for LLL crackles. No signs of volume overload on exam.  - Chest-X ray x2 clear  - Assess O2 requirement with walk test  - Consider CT Chest w/o contrast if persistent    Neuro/Pain:   - Oxycodone 5 mg po prn    Psych: Has a home psychiatrist.   Depression/Anxiety:   - Continue Xanax 0.25 mg BID PRN for anxiety  - Continue Lithium 300mg  nightly - Continue Trinellix 10mg  nightly (patient will bring her home supply).  - Psych consult PRN.   ??  Insomnia:  - Ambien 5mg  nightly.   ??  Caregiver/Lodging:  - Husband [name] will be primary caregiver  - Plan to stay in local rental post transplant??    Plan Summary:  - Discharge today, ANC now >/=0.5  - Continue cefdinir for now, will discontinue as an outpatient if ANC consistently >/= 0.5    Clemens Catholic, MD  Hematology/Oncology Fellow  [facility]

## 2020-02-23 NOTE — Unmapped (Signed)
Susan Kane, 59 y.o., female, allo Flu/Ritux/Methotrexate SCT pt, day +40-here for CLL. This patient is alert, oriented, stable and afebrile. Issues/events for today include loose stools from miralax and nausea from medications. Zofran being given 30 mins before medication administration seems to be working well. Adequate fluid intake. Ambulates frequently in room/hallways. Pt due to discharge today. No other acute or significant events today. WCTM.       Vitals:    02/23/20 0800   BP: 125/73   Pulse: 92   Resp: 18   Temp: 36.8 ??C (98.3 ??F)   SpO2: 97%      Problem: Adult Inpatient Plan of Care  Goal: Plan of Care Review  Outcome: Progressing  Goal: Patient-Specific Goal (Individualization)  Outcome: Progressing  Goal: Absence of Hospital-Acquired Illness or Injury  Outcome: Progressing  Goal: Optimal Comfort and Wellbeing  Outcome: Progressing  Goal: Readiness for Transition of Care  Outcome: Progressing  Goal: Rounds/Family Conference  Outcome: Progressing     Problem: Infection  Goal: Infection Symptom Resolution  Outcome: Progressing     Problem: Fatigue (Stem Cell/Bone Marrow Transplant)  Goal: Energy Level Supports Daily Activity  Outcome: Progressing     Problem: Hematologic Alteration (Stem Cell/Bone Marrow Transplant)  Goal: Blood Counts Within Acceptable Range  Outcome: Progressing     Problem: Nausea and Vomiting (Stem Cell/Bone Marrow Transplant)  Goal: Nausea and Vomiting Symptom Relief  Outcome: Progressing     Problem: Nutrition Intake Altered (Stem Cell/Bone Marrow Transplant)  Goal: Optimal Nutrition Intake  Outcome: Progressing

## 2020-02-23 NOTE — Unmapped (Signed)
Ns. Hollingworth remained afebrile overnight; VS stable.  PRN zofran administered prior to scheduled evening meds to prevent nausea; she did have one emesis episode later in the evening.  Received pRBCs per transfusion parameters.  Protective precautions maintained.  Will continue to monitor.      Problem: Adult Inpatient Plan of Care  Goal: Plan of Care Review  Outcome: Ongoing - Unchanged  Flowsheets (Taken 02/23/2020 0546)  Progress: no change  Plan of Care Reviewed With: patient  Goal: Patient-Specific Goal (Individualization)  Outcome: Ongoing - Unchanged  Flowsheets (Taken 02/23/2020 0546)  Patient-Specific Goals (Include Timeframe): to have nausea adequately controlled to be able to tolerate PO meds today  Individualized Care Needs: PRN zofran administered prior to scheduled meds  Anxieties, Fears or Concerns: ongoing nausea with meds  Goal: Absence of Hospital-Acquired Illness or Injury  Outcome: Ongoing - Unchanged  Intervention: Prevent Skin Injury  Flowsheets (Taken 02/23/2020 0546)  Pressure Reduction Techniques: frequent weight shift encouraged  Intervention: Prevent VTE (venous thromboembolism)  Flowsheets (Taken 02/23/2020 0546)  VTE Prevention/Management:   ambulation promoted   bleeding precautions maintained   bleeding risk factors identified   fluids promoted  Goal: Optimal Comfort and Wellbeing  Outcome: Ongoing - Unchanged  Intervention: Monitor Pain and Promote Comfort  Flowsheets (Taken 02/23/2020 0546)  Pain Management Interventions:   care clustered   quiet environment facilitated  Intervention: Provide Person-Centered Care  Flowsheets (Taken 02/23/2020 0546)  Trust Relationship/Rapport:   care explained   choices provided   emotional support provided   empathic listening provided   thoughts/feelings acknowledged   reassurance provided   questions encouraged   questions answered  Goal: Readiness for Transition of Care  Outcome: Ongoing - Unchanged  Intervention: Mutually Develop Transition Plan  Flowsheets (Taken 02/23/2020 0546)  Concerns to be Addressed: denies needs/concerns at this time  Goal: Rounds/Family Conference  Outcome: Ongoing - Unchanged     Problem: Infection  Goal: Infection Symptom Resolution  Outcome: Ongoing - Unchanged     Problem: Fatigue (Stem Cell/Bone Marrow Transplant)  Goal: Energy Level Supports Daily Activity  Outcome: Ongoing - Unchanged  Intervention: Manage Fatigue  Flowsheets (Taken 02/23/2020 0546)  Fatigue Management: frequent rest breaks encouraged  Sleep/Rest Enhancement:   awakenings minimized   regular sleep/rest pattern promoted     Problem: Hematologic Alteration (Stem Cell/Bone Marrow Transplant)  Goal: Blood Counts Within Acceptable Range  Outcome: Ongoing - Unchanged  Intervention: Monitor and Manage Hematologic Symptoms  Flowsheets (Taken 02/23/2020 0546)  Medication Review/Management: medications reviewed     Problem: Nausea and Vomiting (Stem Cell/Bone Marrow Transplant)  Goal: Nausea and Vomiting Symptom Relief  Outcome: Ongoing - Unchanged  Intervention: Prevent and Manage Nausea and Vomiting  Flowsheets (Taken 02/23/2020 0546)  Nausea/Vomiting Interventions: nausea triggers minimized     Problem: Nutrition Intake Altered (Stem Cell/Bone Marrow Transplant)  Goal: Optimal Nutrition Intake  Outcome: Ongoing - Unchanged  Intervention: Minimize and Manage Barriers to Oral Intake  Flowsheets (Taken 02/23/2020 0546)  Oral Nutrition Promotion:   physical activity promoted   social interaction promoted   rest periods promoted

## 2020-02-24 ENCOUNTER — Encounter: Admit: 2020-02-24 | Discharge: 2020-02-25 | Payer: PRIVATE HEALTH INSURANCE

## 2020-02-24 ENCOUNTER — Ambulatory Visit: Admit: 2020-02-24 | Discharge: 2020-02-25 | Payer: PRIVATE HEALTH INSURANCE | Attending: Oncology | Primary: Oncology

## 2020-02-24 ENCOUNTER — Other Ambulatory Visit: Admit: 2020-02-24 | Discharge: 2020-02-25 | Payer: PRIVATE HEALTH INSURANCE

## 2020-02-24 DIAGNOSIS — C9110 Chronic lymphocytic leukemia of B-cell type not having achieved remission: Secondary | ICD-10-CM | POA: Diagnosis not present

## 2020-02-24 LAB — CBC W/ AUTO DIFF
BASOPHILS ABSOLUTE COUNT: 0 10*9/L (ref 0.0–0.1)
BASOPHILS RELATIVE PERCENT: 0.1 %
EOSINOPHILS ABSOLUTE COUNT: 0 10*9/L (ref 0.0–0.4)
EOSINOPHILS RELATIVE PERCENT: 0.2 %
HEMATOCRIT: 24.7 % — ABNORMAL LOW (ref 36.0–46.0)
HEMOGLOBIN: 8.9 g/dL — ABNORMAL LOW (ref 12.0–16.0)
LARGE UNSTAINED CELLS: 16 % — ABNORMAL HIGH (ref 0–4)
LYMPHOCYTES ABSOLUTE COUNT: 0.2 10*9/L — ABNORMAL LOW (ref 1.5–5.0)
LYMPHOCYTES RELATIVE PERCENT: 13.5 %
MEAN CORPUSCULAR HEMOGLOBIN CONC: 36.2 g/dL (ref 31.0–37.0)
MEAN CORPUSCULAR HEMOGLOBIN: 29.2 pg (ref 26.0–34.0)
MEAN CORPUSCULAR VOLUME: 80.8 fL (ref 80.0–100.0)
MONOCYTES ABSOLUTE COUNT: 0.2 10*9/L (ref 0.2–0.8)
MONOCYTES RELATIVE PERCENT: 14 %
NEUTROPHILS ABSOLUTE COUNT: 0.7 10*9/L — ABNORMAL LOW (ref 2.0–7.5)
NEUTROPHILS RELATIVE PERCENT: 56.4 %
PLATELET COUNT: 13 10*9/L — ABNORMAL LOW (ref 150–440)
RED BLOOD CELL COUNT: 3.06 10*12/L — ABNORMAL LOW (ref 4.00–5.20)
RED CELL DISTRIBUTION WIDTH: 13.1 % (ref 12.0–15.0)

## 2020-02-24 LAB — COMPREHENSIVE METABOLIC PANEL
ALBUMIN: 3.5 g/dL (ref 3.4–5.0)
ALKALINE PHOSPHATASE: 139 U/L — ABNORMAL HIGH (ref 46–116)
ALT (SGPT): 52 U/L — ABNORMAL HIGH (ref 10–49)
ANION GAP: 9 mmol/L (ref 5–14)
AST (SGOT): 21 U/L (ref <=34)
BLOOD UREA NITROGEN: 19 mg/dL (ref 9–23)
BUN / CREAT RATIO: 22
CALCIUM: 9.5 mg/dL (ref 8.7–10.4)
CHLORIDE: 106 mmol/L (ref 98–107)
CREATININE: 0.86 mg/dL — ABNORMAL HIGH
EGFR CKD-EPI AA FEMALE: 85 mL/min/{1.73_m2} (ref >=60)
EGFR CKD-EPI NON-AA FEMALE: 74 mL/min/{1.73_m2} (ref >=60)
GLUCOSE RANDOM: 239 mg/dL — ABNORMAL HIGH (ref 70–179)
POTASSIUM: 3.9 mmol/L (ref 3.4–4.5)
PROTEIN TOTAL: 6.1 g/dL (ref 5.7–8.2)
SODIUM: 140 mmol/L (ref 135–145)

## 2020-02-24 LAB — CMV DNA, QUANTITATIVE, PCR

## 2020-02-24 LAB — DOHLE BODIES

## 2020-02-24 LAB — MAGNESIUM: Magnesium:MCnc:Pt:Ser/Plas:Qn:: 1.5 — ABNORMAL LOW

## 2020-02-24 LAB — LYMPHOCYTES RELATIVE PERCENT: Lymphocytes/100 leukocytes:NFr:Pt:Bld:Qn:Automated count: 13.5

## 2020-02-24 LAB — SLIDE REVIEW

## 2020-02-24 LAB — CALCIUM: Calcium:MCnc:Pt:Ser/Plas:Qn:: 9.5

## 2020-02-24 LAB — TACROLIMUS, TROUGH: Lab: 8

## 2020-02-24 LAB — CMV QUANT: Lab: 100 — ABNORMAL HIGH

## 2020-02-24 MED ADMIN — tbo-filgrastim (GRANIX) injection 300 mcg: 300 ug | SUBCUTANEOUS | @ 18:00:00 | Stop: 2020-02-24

## 2020-02-24 MED ADMIN — heparin, porcine (PF) 100 unit/mL injection 200 Units: 200 [IU] | INTRAVENOUS | @ 14:00:00 | Stop: 2020-02-24

## 2020-02-24 NOTE — Unmapped (Unsigned)
BMT CLINIC PRE-ADMISSION EVALUATION    Susan Kane  59 y.o. Oct 10, 1960   BMT MD:  Lanae Boast, MD    Referring Oncologist: Dr. Jillyn Hidden [facility]) [name] [phone]  Date: 12/31/19    Disease: CLL  Current disease status: SD (stable disease)  Type of Transplant:??RIC MUD Allo  Graft Source: Cryopreserved PBSCs  Transplant Day:??D+40??(02/23/20)  ??  Study Participant: BMT CTN 1704: Composite Health Assessment Risk Model for Older Adults: Applying Pre-transplant Comorbidity, Geriatric Assessment and Biomarkers to Predict Non-Relapse Mortality After Allogeneic Transplant (CHARM).  ??  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  ??  Interval History:   No acute events overnight. Normal PO intake. Denies any recent illnesses or sick contacts. Denies cough, congestion, shortness of breath, wheezing, or sore throat. Denies headaches, lightheadedness, weakness. Denies fever, chills, weight loss, night sweats. Denies abdominal pain, nausea, vomiting, constipation, or diarrhea.  ??  ROS:  Comprehensive ROS negative except pertinent positives listed in interval history.   ??      Susan Kane is a 59 yo woman with a long history of CLL originally diagnosed in 2010. Until 2019 she was followed with observation.   Between 03/2018 and 07/2018 she was treated with ibrutinib, which was effective and had to be discontinued due to disabling arthralgias.   Between 07/2018 and 10/2018 she received acalabrutinib, which was also very effective but produced severe AA/marrow failure.   In 02/2019 Susan Kane received a course of ATG/CSA, which resulted in transient modest improvement in blood counts. Unfortunately, she tolerated CSA poorly and had to be switched to tacrolimus.   She was initially seen in BMT Clinic in 05/2019. In hope of further improvement of her PB counts from ATG/CSA and then tacrolimus, she delayed her FAV evaluation until February 2020. At that time on tacrolimus and Promacta, her ANC was running around 0.3 K/microL and she no longer required plt transfusions. However, she was still is dependent on PRBC transfusions.   Unfortunately her FAV revealed a number of serious concerns: Susan Kane did not meet the criteria to proceed to allo-SCT due to:  - poor PS around ECOG 3;  - creatinine clearance of 17 mL/min;  - severe GI symptoms including anorexia, nausea, vomiting (2-3 times a week) and diarrhea (2 loose bowel movements a day associated with cramping) of several months of duration. Weight loss.   She reported that these symptoms started after she received ATG. GI symptoms were worse when she was on CSA only slightly improved on tacrolimus.     However after stopping Levaquin, Exjade and tacrolimus she rapidly improved. All her GI symptoms resolved and in the end she did not need a GI work up. Oral intake improved and she stopped loosing weight - she actually started gaining weight. She was started on Remeron for appetite. She is taking Zofran before morning pills and Ativan before night pills.  Off tacrolimus cr returned back to normal. She started exercising with personal trainer and right now is feeling quite good.     She was briefly seen several weeks ago and at the time we noted significantly improved WBC and plt. We decided to give her some more time to see if the counts would continue to improve, but they did not. Her blood counts do seem to improve with immunosuppression but they remain far from normal. Thus we decided to  proceed to transplant.      Today Susan Kane reports that she is ready to be admitted for transplant. She is feeling quite well.   She is eating and drinking well. She is no longer on Remeron. She continues gaining weight.   Denies any GI symptoms. Has loose bowel movements 2-3/day but denies diarrhea.   Maintains good PS.   She performs all her ADLs and is exercising with personal trainer three times a week. She feels almost back to normal. She takes short walks around neighborhood.     Not much appetite   Lost 4-5 lbs     ROS:  KS 70%.   See above. The rest of 12 points ROS was negative.     Oncology History Overview Note   CLL:    Summary as per Dr. Kalman Drape most recent note with minor edits/additions from my review of records    1.??CLL?????diagnosed in August 2010, flow cytometry consistent with CLL  ?? Enlarged left??inguinal lymph node January 2019,??small??neck/axillary nodes and palpable splenomegaly 09/11/2017  ?? CTs??on 09/17/2017-3 cm necrotic appearing lymph node in the left inguinal region, borderline enlarged pelvic/retroperitoneal, chest, and axillary nodes. ??Mild splenomegaly.  ?? Ultrasound-guided biopsy of the left inguinal lymph node 09/18/2017, slightly purulent fluid aspirated, core biopsy is consistent with an atypical lymphoid proliferation???extensive necrosis with surrounding epithelioid histiocytes, limited intact lymphoid tissue involved with CLL  ?? Incisional biopsy of a necrotic/purulent left inguinal lymph node on 10/01/2017???extensive necrosis with granulomatous inflammation, small amount of viable lymphoid tissue involved with CLL, AFB and fungal stains negative  ?? Peripheral blood FISH analysis 02/05/2018??? +deletion 13q14, no evidence of p53 (17p13) deletion, no evidence of 11q22??deletion  - normal karyotype (46,XX)  ?? Bone marrow biopsy 02/26/2018???hypercellular marrow with extensive involvement by CLL, lymphocytes represent??85% of all cells  ?? Ibrutinib initiated 04/03/2018  ?? Ibrutinib placed on hold 04/11/2018 due to onset of arthralgias  ?? Ibrutinib resumed 04/16/2018, discontinued 04/25/2018 secondary to severe arthralgias/arthritis  ?? Ibrutinib resumed at a dose of 140 mg daily 05/03/2018  ?? Ibrutinib dose adjusted to 140 mg alternating with 280 mg 06/25/2018  ?? 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 ??Augmentin prescribed.  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    Baseline BM [facility]x reviewed at [facility]  Final Diagnosis   (Outside Case #:  [id number], dated 02/26/2018)  Bone marrow, aspiration and biopsy  -  Hypercellular bone marrow (80%) with extensive involvement by chronic lymphocytic leukemia (87% lymphocytes by manual aspirate differential)  (See Comment)  -  By outside report, cytogenetic results are normal     Karyotype: 64, XX and FISH with 13q del but no 11q or 17p del    Repeat BM bx: (done after being off ibrutinib x1 month)  80% involvement by CLL    08/13/18: Presents to [employer] to discuss plan of care     Chronic lymphocytic leukemia (CLL), B-cell (CMS-HCC)   07/12/2018 Initial Diagnosis    Chronic lymphocytic leukemia (CLL), B-cell (CMS-HCC)     08/26/2018 -  Chemotherapy    Acalabrutinib started (approximate date)     11/14/2018 -  Chemotherapy    Acalabrutinib discontinued due to progressive bone marrow failure     12/06/2018 -  Chemotherapy  Ritux x1 given due to CLL still making up majority of BM cellularity (though in the setting of near aplasia of rest of TLH)     03/19/2019 -  Chemotherapy    Equine ATG / CsA for aplastic anemia (suspect 2/t acalabrutinib)     Chronic lymphocytic leukemia of B-cell type not having achieved remission (CMS-HCC)   01/21/2019 Initial Diagnosis    Chronic lymphocytic leukemia of B-cell type not having achieved remission (CMS-HCC)     01/01/2020 -  Chemotherapy    BMT IP BENDAMUSTINE / FLUDARABINE / RITUXIMAB (OP RITUXIMAB Days -13 & -6) (MUD)  bendamustine 130 mg/m2 IV on days -5, -4, -3   fludarabine 30 mg/m2 IV on days -5, is pink and moist.  Lungs:                Clear to auscultation bilaterally  Heart:                           Regular rate and rhythm  Abdomen:                Soft, non-tender, non-distended. No organomegaly.   Extremities:              Warm and well perfused.  Lymphatics:   No palpable LAD.  Neurological:               Nonfocal and grossly intact.   Musculoskeletal:         Positive for decreased range of motion in bilateral shoulders due to discomfort.    Lab Results   Component Value Date    WBC 1.2 (L) 02/24/2020    HGB 8.9 (L) 02/24/2020    HCT 24.7 (L) 02/24/2020    PLT 13 (L) 02/24/2020       Lab Results   Component Value Date    NA 140 02/24/2020    K 3.9 02/24/2020    CL 106 02/24/2020    CO2 25.0 02/24/2020    BUN 19 02/24/2020    CREATININE 0.86 (H) 02/24/2020    GLU 239 (H) 02/24/2020    CALCIUM 9.5 02/24/2020    MG 1.5 (L) 02/24/2020    PHOS 3.9 02/23/2020       Lab Results   Component Value Date    BILITOT 0.7 02/24/2020    BILIDIR 0.20 02/23/2020    PROT 6.1 02/24/2020    ALBUMIN 3.5 02/24/2020    ALT 52 (H) 02/24/2020    AST 21 02/24/2020    ALKPHOS 139 (H) 02/24/2020    GGT 22 01/09/2020       Lab Results   Component Value Date    INR 1.51 02/23/2020    APTT 46.4 (H) 02/23/2020     Assessment and Plan:  Susan Kane is 59 yo woman with CLL as well as AA/bone marrow failure. She failed Rituxan and ATG/CSA and then tacrolimus. Her blood counts improve somewhat in response to tacrolimus but only that is far from normal and far from being able to sustain future treatment for CLL. While the treatment modalities for CLL have not been exhausted, patient's marrow reserve could not support further CLL therapy.     CLL: The CT of C/A/P on 10/10/19 revealed no evidence of LAD or organomegaly. There was possible endometrial fluid or thickening in the fundus of the uterus.  The BMBx on 10/07/19 showed:   Scant bone marrow sampling (<5% cellular) essentially devoid of hematopoietic  elements.  -  Persistent chronic TbEC delayed released tablet Take 3 tablets (300 mg total) by mouth daily. 90 tablet 1   ??? tacrolimus (PROGRAF) 0.5 MG capsule Take 3 capsules (1.5 mg total) by mouth two (2) times a day. 180 capsule 5   ??? TRINTELLIX 10 mg tablet Take 1 tablet (10 mg total) by mouth at bedtime. 30 tablet 5   ??? valACYclovir (VALTREX) 500 MG tablet Take 1 tablet (500 mg total) by mouth daily. 30 tablet 11   ??? zolpidem (AMBIEN) 10 mg tablet Take 1 tablet (10 mg total) by mouth nightly as needed for sleep. 20 tablet 0     No current facility-administered medications for this visit.     Physical exam:  39, Cares for self; unable to carry on normal activity or to do active work.  BP 103/57  - Pulse 103  - Temp 36.4 ??C (97.5 ??F) (Oral)  - Resp 16  - Wt 55.6 kg (122 lb 8 oz)  - SpO2 97%  - BMI 19.57 kg/m??   General Appearance:   No acute distress, looks comfortable. Much more animated than before.   HEENT:                        Dentition No visible caries, gum disease, or infection. Oral mucosa is pink and moist.  Lungs:                Clear to auscultation bilaterally  Heart:                           Regular rate and rhythm  Abdomen:                Soft, non-tender, non-distended. No organomegaly.   Extremities:              Warm and well perfused.  Lymphatics:   No palpable LAD.  Neurological:               Nonfocal and grossly intact.   Musculoskeletal:         Positive for decreased range of motion in bilateral shoulders due to discomfort.    Lab Results   Component Value Date    WBC 1.2 (L) 02/24/2020    HGB 8.9 (L) 02/24/2020    HCT 24.7 (L) 02/24/2020    PLT 13 (L) 02/24/2020       Lab Results   Component Value Date    NA 140 02/24/2020    K 3.9 02/24/2020    CL 106 02/24/2020    CO2 25.0 02/24/2020    BUN 19 02/24/2020    CREATININE 0.86 (H) 02/24/2020    GLU 239 (H) 02/24/2020    CALCIUM 9.5 02/24/2020    MG 1.5 (L) 02/24/2020    PHOS 3.9 02/23/2020       Lab Results   Component Value Date    BILITOT 0.7 02/24/2020    BILIDIR 0.20 02/23/2020    PROT 6.1 02/24/2020    ALBUMIN 3.5 02/24/2020    ALT 52 (H) 02/24/2020    AST 21 02/24/2020    ALKPHOS 139 (H) 02/24/2020    GGT 22 01/09/2020       Lab Results   Component Value Date    INR 1.51 02/23/2020    APTT 46.4 (H) 02/23/2020     Assessment and Plan:  Diagnosis: Aplastic Anemia  Susan Kane  59 y.o. [birth year]   BMT MD/APP:  Jonatan Wilsey/Brianne        Disease status: SD (stable disease)     Transplant plan and donor information:  Tx type: RIC MUD Allo Match: 10/10   Conditioning: NMA BFR Recipient: Blood Type: O+;CMV Status: negative        Comorbidity Index Score:   aaHCT-CI Score= 5 (4 age adjusted)    (1) Arrhythmia  af/afib   (1) Cardiac  cad/chf/mi/ef<50   (1) CVA  tia/cva   (1) Diabetes  treatment   (3) Heart Valve     (1) Hepa Mild  bili<1.5/ast<2.5   (3)Hepa Severe  bili>1.5/ast>2.5   (1) Infection  abx s/p day 0   (1) Inflam Bowel Disease     (1) Obesity  BMI > 35   (2) Peptic ulcer  treatment   (1) Psych X treatment   (2) Pulm mod  dlco 66-80   (3) Pulm severe  dlco <65   (2) Renal  CR>2 (GFR 43)   (2) Rheum  SLE/RA   (3) Solid tumor X  CLL (active)   Other Co-morbid     Age 35 and  Greater X     Timed Up and Go Test: 8.48 seconds     6 Minute Walk Test   Distance walked: 347 meters (<455m)  Oxygen saturation pre-walk: 100    Oxygen saturation post-walk: 100  Cardiovascular   ECHO: EF 60-65%  EKG: Not yet performed  Cardiology consult: Yes/No  Pulmonary   PFTs: FVC-100.8%; FEV1-93.2%, DLCOc ([place])-85.68%  CXR: No airspace disease  Caregiver plan/Concerns   Primary:   Wilber Oliphant, son   Francely Craw, son  Gwendolyn Mclees, daughter   Madilyn Hook, sister-in-law  ??  Backup:  Alean Rinne, husband   April Reagan, friend    Social concerns: Multiple caregivers  Other concerns: None, well resourced.  Supportive husband.     Overall Assessment and Areas for Improvement:     Disease related deficits:  Currently experiencing nausea that is fairly profound resulting in significant weight loss over last 3 months.  Unclear the exact etiology but suspected to be related to medications and could include either antibiotics or possibly exjade taken for iron overload.  Continues on promacta through local oncologist, Dr. [name].    Plan:  Discuss a trial of holding exjade to see if this will help nausea and allow better oral food intake.  Continue to follow with Dr. [name] for disease treatment and [facility] heme malignancy team.  Will discuss need for treatment of low level of CLL involvement with Dr. Oswald Hillock if pt is a candidate for transplant.      Concerns related to Comorbidities:   She reports around 60 units of PRBCs for lifetime leading to inevitable iron overload.  She also has a history of longstanding anxiety and depression that is managed by psychiatrist that she sees annually.  Stable on regimen of trintellix and lithium with xanax and ativan as needed.   Hypothyroid managed on synthroid.  Insomnia managed with ambien.    Plan: Will refer to [facility] as needed if proceeds with transplant for additional peri-transplant support      Current physical activity:   Assistive Devices: None  Debilitated from weakness that occurred primarily after administration of equine ATG, but persists and limits activity and lifestyle.  No exercise and difficulty with normal level of activity.  Previous to disease was active without restrictions.    Plan: Work to increase physical  activity as close to goal of of cardiovascular activity such as walking 5x/wk as possible    Nutrition Assessment:   Current Diet: Poor current nutrition due to persistent nausea and decreased appetite with substantial weight loss over last 3 months, but likely not adequate nutrition for an even longer period.  Currently underweight.    Plan:  Work on nausea control and increasing caloric intake with emphasis on varied diet and protein intake.  Can try natural sources of probiotics to help with gi distress in form of yogurt and similar foods.    ASSESSMENT OF THE DISEASE:   The CT of C/A/P on 10/10/19 revealed no evidence of LAD or organomegaly. There was possible endometrial fluid or thickening in the fundus of the uterus.  The BMBx on 10/07/19 showed:   Scant bone marrow sampling (<5% cellular) essentially devoid of hematopoietic elements.  -  Persistent chronic lymphocytic leukemia, representing 23% of marrow cellularity by flow cytometric analysis       -  Routine cytogenetic analysis is pending.   - Corresponding CBC: WBC 1.0, ANC 0.1.     11/05/19: BMBx:   -  Hypocellular bone marrow (10%) involved by persistent chronic lymphocytic leukemia, representing 33% of marrow cells by flow cytometric analysis   -  Markedly decreased erythropoiesis and megakaryopoiesis with sparse granulopoiesis   -  Normal karyotype: 46,XX[20]  - Corresponding CBC: WBC 3.8, ANC 1.0.     12/26/19: BMBx:   -  Hypocellular bone marrow (20% overall) involved by chronic lymphocytic leukemia/small lymphocytic lymphoma, representing approximately 20% of marrow cells by PAX5 immunohistochemistry  - Normal karyotype: 46,XX[20]    Assessment and Plan:  Susan Kane is 59 yo woman with CLL as well as AA/bone marrow failure. She failed Rituxan and ATG/CSA and then tacrolimus. Her blood counts improve somewhat in response to tacrolimus but only that is far from normal and far from being able to sustain future treatment for CLL. While the treatment modalities for CLL have not been exhausted, patient's marrow reserve cannot support further CLL therapy.   I believe, an allo-SCT is likely the best option at this point. While initial FAV was very concerning for poor PS, GI symptoms and poor cr clearance all these issues have been addressed and Susan Kane can proceed to transplant. She and her husband understand that the risk of TRM in her case is high but agree that transplant offers the best chance for a long term survival.     Preparative regimen will be NMA BFR:   1. Rituximab 375 mg/m2 on day -13 and 1000 mg/m2 on days -6, +1, and +8  2. Bendamustine 130 mg/m2 IV daily over 10 minutes on days -5 through -3  3. Fludarabine 30 mg/m2 IV daily over 30 minutes on days -5 through -3  GVHD prophylaxis will consist of:  1. Tacrolimus 0.045 mg/kg PO BID starting day -3 followed by 0.03 mg/kg PO BID starting on day -1 with a goal of 5-10 ng/mL  2. Methotrexate 5 mg/m2 on days +1, +3, +6 and +11   3. rATG 1 mg/kg on days -2 and -1 will be included.  Donor: HLA-identical NMDP donor.     Consents:  Patient has met all recipient eligibility/suitability??criteria and is clear to proceed with an allogeneic stem cell transplant. ??Consent for procedure was signed on 12/31/2019. ??An anonymous, unrelated donor was deemed eligible/suitable and was cleared to proceed with stem cell donation by the [facility] Marrow Donor Program.  We reviewed the  risks, benefits, indications and alternatives for transplantation one more time.  We discussed the following consent documents: conditioning chemotherapy and transplantation; stem cell cryopreservation (if needed), long-term data capture for CIBMTR, and transfusion of blood products.  At the completion of our discussion and after all questions were answered satisfactorily for the patient, he signed all consent documents.   ??  I personally spent over 60 minutes face-to-face and non-face-to-face in the care of this patient, which includes all pre, intra, and post visit time on the date of service.      Lanae Boast, MD  Associate Professor  Hematology/Oncology BMTCTP          BMT:??SD (stable disease)  HCT-CI (age adjusted) 5??(active CLL, psych, age adjusted)   Conditioning:  1. Rituximab 375 mg/m2 on day -13 and 1000 mg/m2 on D-6, +1, and +8  2. Bendamustine 130 mg/m2 IV daily over 10 minutes on D-5 through -3  3. Fludarabine 30 mg/m2 IV daily over 30 minutes on D-5 through -3  ??  Donor: 10/10, ABO ), CMV negative  Engraftment: Granix starting D+12 through neutrophil recovery (as defined as ANC 1.0 x 2 days or 3.0 x 1 day)  - Date of last granix injection: TBD  - D22 no engraftment,  - Viral studies showed weakly positive EBV, CMV, HHV-6, plan to monitor for now  - BMBx results showed hypocellular marrow without evidence of hematopiesis. Chimerism studies are still pending  - Repeat viral studies on 7/26  - Started eltrombopag 50 mg daily on 7/27, increased dose to 75 mg daily on 7/30  ?? Date of neutrophil engraftment (first day of ANC ? 500/mm3 for 3 consecutive days): TBD  ?? Date of platelet engraftment (>20K x3 days without transfusion in 7 days): TBD  ?? Number of RBC infusions during transplant hospitalization: 11 units  ?? Number of platelet infusions during transplant hospitalization: 17 units  ??  GvHD prophylaxis:   1. Tacrolimus 0.045 mg/kg PO BID starting D-3 followed by 0.03 mg/kg PO BID starting on D-1 with a goal of 5-10 ng/mL  ????** Current dose 1/0.5mg  BID  2. Methotrexate 5 mg/m2 on D+1, +3, +6 and +11  3. rATG 1 mg/kg on D-2 and -1 included  ??  Heme:??  - Transfusion criteria: Transfuse 1 unit of PRBCs for Hgb<7 and 1 unit of platelets for Plt <10K or bleeding. No history of transfusion reactions.     ID:??  Prophylaxis:  - Antiviral: Continue Valtrex 500 mg po daily on admit  - Antifungal: Posaconazole 300 mg daily  - Antibacterial: Will use cefdinir 300 BID for prophy  ????** history of nausea with levofloxacin when given with CNI and higher dose of Trintellix, so unclear which agent caused nausea. Patient ok with re-challenging with levofloxacin with plan to switch to cefdinir if she develops significant nausea while on levofloxacin  - PJP: Bactrim DS once daily MWF upon platelet engraftment. ??S/p pentamidine on 02/21/20. ??  ??  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 Repeat CMV IgG on 01/07/2020 was once again negative.  ??  Neutropenic Fever, resolved:  ??- Started on empiric cefepime 01/14/20-6/28, switched to cefdinir for prophylaxis as per above  ??- Blood cultures remained negative  - ANC 593 today  ??  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   ??  CINV:   -  Anti-emetics per BMT protocol.   - Zofran works well for her, scheduled ODT q 8 hr (6/22)  - Nightly zyprexa (added 7/2)  ??  Diarrhea: Resolved.   ??  Constipation:??Changing miralax to PRN, senna QHS    Renal:   Non-oliguric AKI: Likely pre-renal in the setting of poor PO intake. No history of urinary retention/obstruction.  -??Cr??now improved to??baseline of ~ 0.7  - Follow up urine studies  - Strict I&O, avoid nephrotoxins  ??  FEN: ??Electrolyte replacement per protocol  - Continue folic acid.  - Will monitor PO fluid intake and administer IVF as necessary  ??  Endocrine:  Hypothyroid:??  - Continue Synthroid daily.     Hepatic:??  - VOD prophylaxis not warranted as non-myeloablative regimen.   ??  Hepatocellular liver injury pattern:  - Mild increase in AST, ALT, and AlkPhos, likely secondary to posaconazole  - Follow up repeat viral studies on Monday     CV:   HTN: Improved on 5 mg amlodipine.     Pulm:??  AHRF: First noted on 7/22. Intermittent O2 requirement with exam findings concerning for LLL crackles. No signs of volume overload on exam.  - Chest-X ray x2 clear  - Assess O2 requirement with walk test  - Consider CT Chest w/o contrast if persistent    Neuro/Pain:??  - Oxycodone 5 mg po prn    Psych:??Has a home psychiatrist.   Depression/Anxiety:   - Continue Xanax 0.25 mg BID PRN for anxiety  - Continue Lithium 300mg  nightly  - Continue Trinellix 10mg  nightly (patient will bring her home supply).  - Psych consult PRN.   ??  Insomnia:  - Ambien 5mg  nightly.   ??  Caregiver/Lodging:  - Husband [name] will be primary caregiver  - Plan to stay in local rental post transplant??  ??  ??  Labs for first clinic visit entered: Yes  ??  Condition at Discharge: good  ??  I spent greater than 30 minutes in the discharge of this patient.  ??  Milon Score, MD   [facility] Bone Marrow Transplant and Cellular Therapy Progam

## 2020-02-25 ENCOUNTER — Encounter: Admit: 2020-02-25 | Discharge: 2020-02-26 | Payer: PRIVATE HEALTH INSURANCE

## 2020-02-25 ENCOUNTER — Encounter: Admit: 2020-02-25 | Discharge: 2020-02-25 | Payer: PRIVATE HEALTH INSURANCE

## 2020-02-25 DIAGNOSIS — Z08 Encounter for follow-up examination after completed treatment for malignant neoplasm: Secondary | ICD-10-CM | POA: Diagnosis not present

## 2020-02-25 DIAGNOSIS — Z7682 Awaiting organ transplant status: Secondary | ICD-10-CM | POA: Diagnosis not present

## 2020-02-25 DIAGNOSIS — Z9484 Stem cells transplant status: Principal | ICD-10-CM

## 2020-02-25 LAB — CBC W/ AUTO DIFF
BASOPHILS ABSOLUTE COUNT: 0 10*9/L (ref 0.0–0.1)
BASOPHILS RELATIVE PERCENT: 0.1 %
EOSINOPHILS ABSOLUTE COUNT: 0 10*9/L (ref 0.0–0.4)
EOSINOPHILS RELATIVE PERCENT: 0.1 %
HEMATOCRIT: 23.6 % — ABNORMAL LOW (ref 36.0–46.0)
HEMOGLOBIN: 8.6 g/dL — ABNORMAL LOW (ref 12.0–16.0)
LARGE UNSTAINED CELLS: 13 % — ABNORMAL HIGH (ref 0–4)
LYMPHOCYTES ABSOLUTE COUNT: 0.1 10*9/L — ABNORMAL LOW (ref 1.5–5.0)
LYMPHOCYTES RELATIVE PERCENT: 10.2 %
MEAN CORPUSCULAR HEMOGLOBIN CONC: 36.4 g/dL (ref 31.0–37.0)
MEAN CORPUSCULAR VOLUME: 80.3 fL (ref 80.0–100.0)
MEAN PLATELET VOLUME: 9.6 fL (ref 7.0–10.0)
MONOCYTES ABSOLUTE COUNT: 0.2 10*9/L (ref 0.2–0.8)
MONOCYTES RELATIVE PERCENT: 12.1 %
NEUTROPHILS ABSOLUTE COUNT: 0.8 10*9/L — ABNORMAL LOW (ref 2.0–7.5)
PLATELET COUNT: <5 10*9/L — CL (ref 150–440)
RED BLOOD CELL COUNT: 2.94 10*12/L — ABNORMAL LOW (ref 4.00–5.20)
RED CELL DISTRIBUTION WIDTH: 12.9 % (ref 12.0–15.0)
WBC ADJUSTED: 1.2 10*9/L — ABNORMAL LOW (ref 4.5–11.0)

## 2020-02-25 LAB — CMV QUANT: Lab: 207 — ABNORMAL HIGH

## 2020-02-25 LAB — NEUTROPHILS RELATIVE PERCENT: Neutrophils/100 leukocytes:NFr:Pt:Bld:Qn:Automated count: 64.6

## 2020-02-25 LAB — PLATELET COUNT
Platelets:NCnc:Pt:Bld:Qn:Automated count: 11 — ABNORMAL LOW
Platelets:NCnc:Pt:Bld:Qn:Automated count: 15 — ABNORMAL LOW

## 2020-02-25 LAB — SLIDE REVIEW

## 2020-02-25 LAB — TOXIC GRANULATION

## 2020-02-25 LAB — CMV DNA, QUANTITATIVE, PCR: CMV QUANT: 207 [IU]/mL — ABNORMAL HIGH (ref <0)

## 2020-02-25 MED ORDER — VALGANCICLOVIR 450 MG TABLET
ORAL_TABLET | Freq: Two times a day (BID) | ORAL | 0 refills | 30.00000 days
Start: 2020-02-25 — End: 2020-02-25

## 2020-02-25 MED ADMIN — tbo-filgrastim (GRANIX) injection 300 mcg: 300 ug | SUBCUTANEOUS | @ 17:00:00 | Stop: 2020-02-25

## 2020-02-25 NOTE — Unmapped (Incomplete)
Patient arrived to chair 1 for platelets.  No complaints noted.  Access of CVC intact with blood return.     Post platelet is 11.    Patient to receive second unit of platelets.

## 2020-02-25 NOTE — Unmapped (Cosign Needed)
Pharmacy Post-Discharge Assessment:    Susan Kane is a 59 y.o. 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  Her transplant course was complicated by profoundly delayed count recovery. She had low-level positive HHV6 from her blood, most recently 41.9 cycles on 7/26. She was NOT started on foscarnet as her WBCs began to gradually improve. Despite this prolonged neutropenia, she remained afebrile throughout the admission. Her mild chemotherapy-related n/v was well controlled with schedule olanzapine and prn medications. The patient was discharged on 8/2 and arrives in clinic today for her first oupatient follow up visit.    Interval History:  Susan Kane presents to clinic today accompanied by her husband and is feeling really good so far. She had a headache this morning that resolved on its own. She additionally had one episode of nausea last night that she says went away on its own. For N/V, she continues on scheduled zyprexa at bedtime and is taking a dose of zofran twice a day before her morning and her evening pills.She says she has started to feel a little hungry, but fills up with just a few bites. This morning she had banana bread and a chai latte.She has had a hard time with her fluid intake and was encouraged to drink more water. Since discharge, she reports having a soda and juice this morning. Her husband picked up a supply of her lithium and said they do not need a refill of this medication delivered to clinic at this time. She denies any fevers, rash, or diarrhea. She inquired about timing of her levothyroxine and was agreeable to try taking it first thing in the morning on an empty stomach (30-60 minutes before food and other medications).    Oncology History Overview Note   CLL:    Summary as per Dr. Kalman Drape most recent note with minor edits/additions from my review of records    1.??CLL?????diagnosed in August 2010, flow cytometry consistent with CLL  ?? Enlarged left??inguinal lymph node January 2019,??small??neck/axillary nodes and palpable splenomegaly 09/11/2017  ?? CTs??on 09/17/2017-3 cm necrotic appearing lymph node in the left inguinal region, borderline enlarged pelvic/retroperitoneal, chest, and axillary nodes. ??Mild splenomegaly.  ?? Ultrasound-guided biopsy of the left inguinal lymph node 09/18/2017, slightly purulent fluid aspirated, core biopsy is consistent with an atypical lymphoid proliferation???extensive necrosis with surrounding epithelioid histiocytes, limited intact lymphoid tissue involved with CLL  ?? Incisional biopsy of a necrotic/purulent left inguinal lymph node on 10/01/2017???extensive necrosis with granulomatous inflammation, small amount of viable lymphoid tissue involved with CLL, AFB and fungal stains negative  ?? Peripheral blood FISH analysis 02/05/2018??? +deletion 13q14, no evidence of p53 (17p13) deletion, no evidence of 11q22??deletion  - normal karyotype (46,XX)  ?? Bone marrow biopsy 02/26/2018???hypercellular marrow with extensive involvement by CLL, lymphocytes represent??85% of all cells  ?? Ibrutinib initiated 04/03/2018  ?? Ibrutinib placed on hold 04/11/2018 due to onset of arthralgias  ?? Ibrutinib resumed 04/16/2018, discontinued 04/25/2018 secondary to severe arthralgias/arthritis  ?? Ibrutinib resumed at a dose of 140 mg daily 05/03/2018  ?? Ibrutinib dose adjusted to 140 mg alternating with 280 mg 06/25/2018  ?? 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 ??Augmentin prescribed.  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 DAT negative, bilirubin and LDH normal June 2019, progressive  symptomatic anemia 04/01/2018, red cell transfusions 04/01/2018,??04/30/2018,??05/28/2018, 06/17/2018, and 07/05/2018 7.????Hypogammaglobulinemia    Baseline [facility]x reviewed at [facility]  Final Diagnosis   (Outside Case #:  [id number], dated 02/26/2018)  Bone marrow, aspiration and biopsy  -  Hypercellular bone marrow (80%) with extensive involvement by chronic lymphocytic leukemia (87% lymphocytes by manual aspirate differential)  (See Comment)  -  By outside report, cytogenetic results are normal     Karyotype: 42, XX and FISH with 13q del but no 11q or 17p del    Repeat BM bx: (done after being off ibrutinib x1 month)  80% involvement by CLL    08/13/18: Presents to [facility] to discuss plan of care     Chronic lymphocytic leukemia (CLL), B-cell (CMS-HCC)   07/12/2018 Initial Diagnosis    Chronic lymphocytic leukemia (CLL), B-cell (CMS-HCC)     08/26/2018 -  Chemotherapy    Acalabrutinib started (approximate date)     11/14/2018 -  Chemotherapy    Acalabrutinib discontinued due to progressive bone marrow failure     12/06/2018 -  Chemotherapy    Ritux x1 given due to CLL still making up majority of BM cellularity (though in the setting of near aplasia of rest of TLH)     03/19/2019 -  Chemotherapy    Equine ATG / CsA for aplastic anemia (suspect 2/t acalabrutinib)     Chronic lymphocytic leukemia of B-cell type not having achieved remission (CMS-HCC)   01/21/2019 Initial Diagnosis    Chronic lymphocytic leukemia of B-cell type not having achieved remission (CMS-HCC)     01/01/2020 -  Chemotherapy    BMT IP BENDAMUSTINE / FLUDARABINE / RITUXIMAB (OP RITUXIMAB Days -13 & -6) (MUD)  bendamustine 130 mg/m2 IV on days -5, -4, -3   fludarabine 30 mg/m2 IV on days -5, -4, -3   riTUXimab 375 mg/m2 IV on day -13 as a OP treatment day, then 1,000 mg/m2 on days -6 as a OP treatment day, +1, +8   tacrolimus, 0.045 mg/kg PO BID on days -3 and -2 then  0.03 mg/kg PO BID starting on day -1   methotrexate 5 mg/m2 IV on days +1, +3, +6, +11   tbo-filgrastim 300 mcg (<= 75 kg) / 480 mcg (> 75 kg) q24h starting on day +7          Current Outpatient Medications   Medication Sig Dispense Refill   ??? cefdinir (OMNICEF) 300 MG capsule Take 1 capsule (300 mg total) by mouth Two (2) times a day. 30 capsule 0   ??? eltrombopag (PROMACTA) 50 MG tablet Take 2 tablets (100 mg total) by mouth daily. Administer on an empty stomach, 1 hour before or 2 hours after a meal. 60 tablet 2   ??? famotidine (PEPCID) 20 MG tablet Take 1 tablet (20 mg total) by mouth Two (2) times a day. 60 tablet 0   ??? folic acid (FOLVITE) 1 MG tablet Take 1 tablet (1 mg total) by mouth at bedtime. 30 tablet 5   ??? levothyroxine (SYNTHROID) 100 MCG tablet Take 1 tablet (100 mcg total) by mouth daily. 30 tablet 5   ??? lithium (LITHOBID) 300 MG ER tablet Take 1 tablet (300 mg total) by mouth at bedtime. 30 tablet 5   ??? magnesium oxide-Mg AA chelate (MAGNESIUM, AMINO ACID CHELATE,) 133 mg Tab Take 1 tablet by mouth Two (2) times a day. 100 tablet 2   ??? OLANZapine zydis (ZYPREXA) 5 MG disintegrating tablet Take 1 tablet (5 mg total)  by mouth nightly. 30 tablet 2   ??? ondansetron (ZOFRAN-ODT) 8 MG disintegrating tablet Dissolve  1 tablet (8 mg total) by mouth every eight (8) hours as needed for nausea. 30 tablet 2   ??? posaconazole (NOXAFIL) 100 mg TbEC delayed released tablet Take 3 tablets (300 mg total) by mouth daily. 90 tablet 1   ??? tacrolimus (PROGRAF) 0.5 MG capsule Take 3 capsules (1.5 mg total) by mouth two (2) times a day. 180 capsule 5   ??? TRINTELLIX 10 mg tablet Take 1 tablet (10 mg total) by mouth at bedtime. 30 tablet 5   ??? valACYclovir (VALTREX) 500 MG tablet Take 1 tablet (500 mg total) by mouth daily. 30 tablet 11   ??? zolpidem (AMBIEN) 10 mg tablet Take 1 tablet (10 mg total) by mouth nightly as needed for sleep. 20 tablet 0     No current facility-administered medications for this visit.       Assessment/Plan:    **BMT  -Currently day +41 s/p transplant.  -WBC: 1.2,  ANC 0.7, Hgb 8.9, PLT 13  - She did NOT receive G-CSF prior to discharge yesterday; continue daily administration of Granix in clinic starting today; plan to HOLD off on Udenyca referral for now in anticipation of engraftment soon per discussion with Dr. Oswald Hillock  - Continue promacta 100 mg PO Qday; she was given a 15-day supply and may be able to discontinue if counts continue increasing trend    GVHD Prophylaxis  - Tacrolimus level = 8 ng/mL today, goal 5-10 ng/mL; continue current dose of tacrolimus 1.5 mg PO BID; will recheck level at next visit    **ID  1. Prophylaxis:  - antibacterial - as ANC has been slow to engraft,she currently continues on cefdinir 300 mg PO BID for prophylaxis (prior GI intolerance to levofloxacin); if ANC consistently remains > 0.5 this week, consider discontinuing cefdinir; she was discharged with a 2-week supply  - antifungal - continue posaconazole 300 mg PO QDay through day + 75 due to prolonged neutropenia  - antiviral - valacyclovir 500 mg PO QDay through 2 years post-transplant  - PCP - s/p pentamidine on 02/21/20 due to delayed engraftment (due for next PCP prophylaxis ~03/20/20); plan for bactrim 1 DS tablet PO QDay Mon/Wed/Fri only through completion of immunosuppression upon platelet engraftment (*RX has not been sent yet*)    **FENGI  -Recommend continuing famotidine for 1-2 weeks following discharge.  At that time, Susan Kane may transition to PRN use or DC if there is no ongoing need.   -N/V:      -Continue olanzapine nightly; may consider discontinuing olanzapine in 1-2 weeks if no ongoing symptoms      -Continue zofran 8 mg PO BID before morning and evening medicines; may take 1 additional dose/day PRN for breakthrough nausea/vomiting  -Hypomagnesemia: continue mag chelate 1 tab PO BID; Mag 1.5 today (up from 1.4 yesterday) and just started PO magnesium supplement last night; continue to monitor and titrate as needed    **CV  -HTN: was started on amlodipine 5 mg daily during admission; bp 103/57 this morning and patient had not taken amlodipine yet, therefore, discontinued amlodipine today; continue to monitor bp and can consider restarting if indicated    **Endo  -Continue home levothyroxine; patient agreeable to try taking it first thing in the morning on an empty stomach (30-60 minutes before food and other medications).    **Neuro/pain  -Continue home regimen of Trintellix and lithium ER (no changes made during admission)  -  Continue home zolpidem PRN sleep    **[facility]/Bone Health  -30-day DEXA scan ordered on 8/9  -F/u results when available to determine need for zoledronic acid infusions    Pharmacy Information: Susan Kane had prescriptions filled at [facility] (phone:[phone]) and [facility] (phone:[phone]). **Specialty pharmacy required for tacrolimus and eltrombopag.**    At the time of discharge, there were no issues with acquiring medications, and insurance coverage for all medications was adequate. **Copay card obtained for eltrombopag for $25 fill.** High copay for Trintillex ($100), patient aware.    I provided Susan Kane with an updated MedActionPlan at this visit. I would be happy to see Susan Kane in the future as needed for further medication managment issues.    Medications prescribed or ordered upon discharge were reviewed today and reconciled with the most recent outpatient medication list.  Medication reconciliation was conducted by a clinical pharmacist    I spent 24 minutes in direct patient care with this patient    Thank you,    Dallas Schimke, PharmD, CPP  BMT Clinical Pharmacist Practitioner

## 2020-02-26 DIAGNOSIS — Z9484 Stem cells transplant status: Principal | ICD-10-CM

## 2020-02-26 LAB — EBV VIRAL LOAD RESULT
Lab: NOT DETECTED
Lab: NOT DETECTED

## 2020-02-27 ENCOUNTER — Other Ambulatory Visit: Admit: 2020-02-27 | Discharge: 2020-02-28 | Payer: PRIVATE HEALTH INSURANCE

## 2020-02-27 ENCOUNTER — Encounter: Admit: 2020-02-27 | Discharge: 2020-02-28 | Payer: PRIVATE HEALTH INSURANCE

## 2020-02-27 ENCOUNTER — Ambulatory Visit: Admit: 2020-02-27 | Discharge: 2020-02-28 | Payer: PRIVATE HEALTH INSURANCE

## 2020-02-27 DIAGNOSIS — C9110 Chronic lymphocytic leukemia of B-cell type not having achieved remission: Secondary | ICD-10-CM | POA: Diagnosis not present

## 2020-02-27 DIAGNOSIS — D701 Agranulocytosis secondary to cancer chemotherapy: Secondary | ICD-10-CM | POA: Diagnosis not present

## 2020-02-27 DIAGNOSIS — D696 Thrombocytopenia, unspecified: Secondary | ICD-10-CM | POA: Diagnosis not present

## 2020-02-27 DIAGNOSIS — Z7689 Persons encountering health services in other specified circumstances: Secondary | ICD-10-CM | POA: Diagnosis not present

## 2020-02-27 DIAGNOSIS — T451X5A Adverse effect of antineoplastic and immunosuppressive drugs, initial encounter: Secondary | ICD-10-CM | POA: Diagnosis not present

## 2020-02-27 DIAGNOSIS — Z7682 Awaiting organ transplant status: Secondary | ICD-10-CM | POA: Diagnosis not present

## 2020-02-27 DIAGNOSIS — D801 Nonfamilial hypogammaglobulinemia: Secondary | ICD-10-CM | POA: Diagnosis not present

## 2020-02-27 DIAGNOSIS — Z9484 Stem cells transplant status: Secondary | ICD-10-CM | POA: Diagnosis not present

## 2020-02-27 DIAGNOSIS — D849 Immunodeficiency, unspecified: Secondary | ICD-10-CM | POA: Diagnosis not present

## 2020-02-27 DIAGNOSIS — D619 Aplastic anemia, unspecified: Secondary | ICD-10-CM | POA: Diagnosis not present

## 2020-02-27 DIAGNOSIS — Z9481 Bone marrow transplant status: Secondary | ICD-10-CM | POA: Diagnosis not present

## 2020-02-27 LAB — CBC W/ AUTO DIFF
BASOPHILS ABSOLUTE COUNT: 0 10*9/L (ref 0.0–0.1)
BASOPHILS RELATIVE PERCENT: 0.8 %
EOSINOPHILS ABSOLUTE COUNT: 0 10*9/L (ref 0.0–0.4)
EOSINOPHILS RELATIVE PERCENT: 0.3 %
HEMATOCRIT: 20.6 % — ABNORMAL LOW (ref 36.0–46.0)
HEMOGLOBIN: 7.5 g/dL — ABNORMAL LOW (ref 12.0–16.0)
LARGE UNSTAINED CELLS: 14 % — ABNORMAL HIGH (ref 0–4)
LYMPHOCYTES ABSOLUTE COUNT: 0.1 10*9/L — ABNORMAL LOW (ref 1.5–5.0)
MEAN CORPUSCULAR HEMOGLOBIN CONC: 36.7 g/dL (ref 31.0–37.0)
MEAN CORPUSCULAR HEMOGLOBIN: 29.4 pg (ref 26.0–34.0)
MEAN PLATELET VOLUME: 11.3 fL — ABNORMAL HIGH (ref 7.0–10.0)
MONOCYTES ABSOLUTE COUNT: 0.1 10*9/L — ABNORMAL LOW (ref 0.2–0.8)
MONOCYTES RELATIVE PERCENT: 16.8 %
NEUTROPHILS ABSOLUTE COUNT: 0.5 10*9/L — ABNORMAL LOW (ref 2.0–7.5)
NEUTROPHILS RELATIVE PERCENT: 54.9 %
RED BLOOD CELL COUNT: 2.56 10*12/L — ABNORMAL LOW (ref 4.00–5.20)
RED CELL DISTRIBUTION WIDTH: 12.8 % (ref 12.0–15.0)
WBC ADJUSTED: 0.8 10*9/L — ABNORMAL LOW (ref 4.5–11.0)

## 2020-02-27 LAB — COMPREHENSIVE METABOLIC PANEL
ALBUMIN: 3.6 g/dL (ref 3.4–5.0)
ALKALINE PHOSPHATASE: 138 U/L — ABNORMAL HIGH (ref 46–116)
ALT (SGPT): 61 U/L — ABNORMAL HIGH (ref 10–49)
ANION GAP: 6 mmol/L (ref 5–14)
AST (SGOT): 39 U/L — ABNORMAL HIGH (ref <=34)
BLOOD UREA NITROGEN: 15 mg/dL (ref 9–23)
BUN / CREAT RATIO: 16
CALCIUM: 9.3 mg/dL (ref 8.7–10.4)
CHLORIDE: 106 mmol/L (ref 98–107)
CO2: 25 mmol/L (ref 20.0–31.0)
CREATININE: 0.93 mg/dL — ABNORMAL HIGH
EGFR CKD-EPI NON-AA FEMALE: 67 mL/min/{1.73_m2} (ref >=60)
GLUCOSE RANDOM: 197 mg/dL — ABNORMAL HIGH (ref 70–179)
POTASSIUM: 3.7 mmol/L (ref 3.4–4.5)
SODIUM: 137 mmol/L (ref 135–145)

## 2020-02-27 LAB — MAGNESIUM: Magnesium:MCnc:Pt:Ser/Plas:Qn:: 1.5 — ABNORMAL LOW

## 2020-02-27 LAB — LYMPHOCYTES RELATIVE PERCENT: Lymphocytes/100 leukocytes:NFr:Pt:Bld:Qn:Automated count: 13

## 2020-02-27 LAB — PLATELET COUNT: Platelets:NCnc:Pt:Bld:Qn:Automated count: 16 — ABNORMAL LOW

## 2020-02-27 LAB — SMEAR REVIEW

## 2020-02-27 LAB — ALT (SGPT): Alanine aminotransferase:CCnc:Pt:Ser/Plas:Qn:: 61 — ABNORMAL HIGH

## 2020-02-27 MED ADMIN — tbo-filgrastim (GRANIX) injection 300 mcg: 300 ug | SUBCUTANEOUS | @ 21:00:00 | Stop: 2020-02-27

## 2020-02-27 MED ADMIN — heparin, porcine (PF) 100 unit/mL injection 200 Units: 200 [IU] | INTRAVENOUS | @ 17:00:00 | Stop: 2020-02-28

## 2020-02-27 NOTE — Unmapped (Signed)
BMT CLINIC FOLLOW UP    Susan Kane  59 y.o. [birth year]   BMT MD:  Susan Boast, MD    Referring Oncologist: Dr. Jillyn [facility]) [name] [phone]  Date: 02/24/20    Disease: CLL  Current disease status: SD (stable disease)  Type of Transplant:??RIC MUD Allo  Graft Source: Cryopreserved PBSCs  Transplant Day:??D+44??(02/24/20)  ??  Study Participant: BMT CTN 1704: Composite Health Assessment Risk Model for Older Adults: Applying Pre-transplant Comorbidity, Geriatric Assessment and Biomarkers to Predict Non-Relapse Mortality After Allogeneic Transplant (CHARM).  ??  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 She remains on eltrobopag and Granix.     Susan Kane is her today for follow up and she is with her daughter.  Today she continues to report fatigue.  Sleeps a lot. Sleeping ok at night. She has not been very active. She rides in the car to go on errands but waits in the care till errand is done, naps, comes to appts.  She does ADLs independently.  Not yet cooking.  She does not prepare he own plate.  No chores.     24hr food recall:  toaster streusel, banana bread, chai tea latte, chicken and rice and garlic bread and asparagus. Threw up after dinner.  She did not take zofran before dinner pills and this was likely the culprit she suspects.  He finds PM to be high risk for emesis. Food taste poorly. She consumes a small variety of drinks: water with flavor packets, coke, vitamin water.  1.5 L would be a generous day of intake.  She has constipation.  She would like to try colace to keep her regular.   Skin is good. No infectious sx to c/o today.   ??  ROS:  Comprehensive ROS negative except pertinent positives listed in interval history.     Oncology History Overview Note   CLL:    Summary as per Dr. Kalman Drape most recent note with minor edits/additions from my review of records 1.??CLL?????diagnosed in August 2010, flow cytometry consistent with CLL  ?? Enlarged left??inguinal lymph node January 2019,??small??neck/axillary nodes and palpable splenomegaly 09/11/2017  ?? CTs??on 09/17/2017-3 cm necrotic appearing lymph node in the left inguinal region, borderline enlarged pelvic/retroperitoneal, chest, and axillary nodes. ??Mild splenomegaly.  ?? Ultrasound-guided biopsy of the left inguinal lymph node 09/18/2017, slightly purulent fluid aspirated, core biopsy is consistent with an atypical lymphoid proliferation???extensive necrosis with surrounding epithelioid histiocytes, limited intact lymphoid tissue involved with CLL  ?? Incisional biopsy of a necrotic/purulent left inguinal lymph node on 10/01/2017???extensive necrosis with granulomatous inflammation, small amount of viable lymphoid tissue involved with CLL, AFB and fungal stains negative  ?? Peripheral blood FISH analysis 02/05/2018??? +deletion 13q14, no evidence of p53 (17p13) deletion, no evidence of 11q22??deletion  - normal karyotype (46,XX)  ?? Bone marrow biopsy 02/26/2018???hypercellular marrow with extensive involvement by CLL, lymphocytes represent??85% of all cells  ?? Ibrutinib initiated 04/03/2018  ?? Ibrutinib placed on hold 04/11/2018 due to onset of arthralgias  ?? Ibrutinib resumed 04/16/2018, discontinued 04/25/2018 secondary to severe arthralgias/arthritis  ?? Ibrutinib resumed at a dose of 140 mg daily 05/03/2018  ?? Ibrutinib dose adjusted to 140 mg alternating with 280 mg 06/25/2018  ?? 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 ??Augmentin prescribed.  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    Baseline BM bx reviewed at [facility]  Final Diagnosis   (Outside Case #:  [id number], dated 02/26/2018)  Bone marrow, aspiration and biopsy  -  Hypercellular bone marrow (80%) with extensive involvement by chronic lymphocytic leukemia (87% lymphocytes by manual aspirate differential)  (See Comment)  -  By outside report, cytogenetic results are normal     Karyotype: 19, XX and FISH with 13q del but no 11q or 17p del    Repeat BM bx: (done after being off ibrutinib x1 month)  80% involvement by CLL    08/13/18: Presents to [facility] to discuss plan of care     Chronic lymphocytic leukemia (CLL), B-cell (CMS-HCC)   07/12/2018 Initial Diagnosis    Chronic lymphocytic leukemia (CLL), B-cell (CMS-HCC)     08/26/2018 -  Chemotherapy    Acalabrutinib started (approximate date)     11/14/2018 -  Chemotherapy    Acalabrutinib discontinued due to progressive bone marrow failure     12/06/2018 -  Chemotherapy    Ritux x1 given due to CLL still making up majority of BM cellularity (though in the setting of near aplasia of rest of TLH)     03/19/2019 -  Chemotherapy    Equine ATG / CsA for aplastic anemia (suspect 2/t acalabrutinib)     Chronic lymphocytic leukemia of B-cell type not having achieved remission (CMS-HCC)   01/21/2019 Initial Diagnosis    Chronic lymphocytic leukemia of B-cell type not having achieved remission (CMS-HCC)     01/01/2020 -  Chemotherapy    BMT IP BENDAMUSTINE / FLUDARABINE / RITUXIMAB (OP RITUXIMAB Days -13 & -6) (MUD)  bendamustine 130 mg/m2 IV on days -5, -4, -3   fludarabine 30 mg/m2 IV on days -5, -4, -3   riTUXimab 375 mg/m2 IV on day -13 as a OP treatment day, then 1,000 mg/m2 on days -6 as a OP treatment day, +1, +8   tacrolimus, 0.045 mg/kg PO BID on days -3 and -2 then  0.03 mg/kg PO BID starting on day -1   methotrexate 5 mg/m2 IV on days +1, +3, +6, +11   tbo-filgrastim 300 mcg (<= 75 kg) / 480 mcg (> 75 kg) q24h starting on day +7        Past Medical History:   Diagnosis Date   ??? Anxiety    ??? Depression    ??? Hypothyroid      Past Surgical History:   Procedure Laterality Date   ??? BONE MARROW BIOPSY & ASPIRATION  02/10/2020        ??? CESAREAN SECTION         Current Outpatient Medications:   ???  cefdinir (OMNICEF) 300 MG capsule, Take 1 capsule (300 mg total) by mouth Two (2) times a day., Disp: 30 capsule, Rfl: 0  ???  eltrombopag (PROMACTA) 50 MG tablet, Take 2 tablets (100 mg total) by mouth daily. Administer on an empty stomach, 1 hour before or 2 hours after a meal., Disp: 60 tablet, Rfl: 2  ???  famotidine (PEPCID) 20 MG tablet, Take 1 tablet (20 mg total) by mouth Two (2) times a day., Disp: 60 tablet, Rfl: 0  ???  folic acid (FOLVITE) 1 MG tablet, Take 1 tablet (1 mg total) by mouth at bedtime., Disp: 30 tablet, Rfl: 5  ???  levothyroxine (SYNTHROID) 100 MCG tablet, Take 1 tablet (100 mcg total) by mouth daily., Disp: 30 tablet, Rfl: 5  ???  lithium (LITHOBID) 300 MG ER tablet, Take 1 tablet (300 mg total) by mouth at bedtime., Disp: 30 tablet, Rfl: 5  ???  magnesium oxide-Mg AA chelate (MAGNESIUM, AMINO ACID CHELATE,) 133 mg Tab, Take 1 tablet by mouth Two (2) times a day., Disp: 100 tablet, Rfl: 2  ???  OLANZapine zydis (ZYPREXA) 5 MG disintegrating tablet, Take 1 tablet (5 mg total) by mouth nightly., Disp: 30 tablet, Rfl: 2  ???  ondansetron (ZOFRAN-ODT) 8 MG disintegrating tablet, Dissolve  1 tablet (8 mg total) by mouth every eight (8) hours as needed for nausea., Disp: 30 tablet, Rfl: 2  ???  posaconazole (NOXAFIL) 100 mg TbEC delayed released tablet, Take 3 tablets (300 mg total) by mouth daily., Disp: 90 tablet, Rfl: 1  ???  tacrolimus (PROGRAF) 0.5 MG capsule, Take 3 capsules (1.5 mg total) by mouth two (2) times a day., Disp: 180 capsule, Rfl: 5  ???  TRINTELLIX 10 mg tablet, Take 1 tablet (10 mg total) by mouth at bedtime., Disp: 30 tablet, Rfl: 5  ???  valACYclovir (VALTREX) 500 MG tablet, Take 1 tablet (500 mg total) by mouth daily., Disp: 30 tablet, Rfl: 11  ???  zolpidem (AMBIEN) 10 mg tablet, Take 1 tablet (10 mg total) by mouth nightly as needed for sleep., Disp: 20 tablet, Rfl: 0  No current facility-administered medications for this visit.    Facility-Administered Medications Ordered in Other Visits:   ???  heparin, porcine (PF) 100 unit/mL injection 200 Units, 200 Units, Intravenous, Q30 Min PRN, Pernell Dupre, MD, 200 Units at 02/27/20 1230  ???  tbo-filgrastim (GRANIX) injection 300 mcg, 300 mcg, Subcutaneous, Once, [employer], ANP    Physical exam:  69, Cares for self; unable to carry on normal activity or to do active work.  BP 108/53  - Pulse 93  - Temp 36.6 ??C (97.9 ??F) (Oral)  - Resp 16  - Wt 55.7 kg (122 lb 12.8 oz)  - SpO2 97%  - BMI 19.62 kg/m??   General Appearance:   No acute distress, looks comfortable.   HEENT:                        Dentition No visible caries, gum disease, or infection. Oral mucosa is pink and moist.  Lungs:                Clear to auscultation bilaterally  Heart:                           Regular rate and rhythm  Abdomen:                Soft, non-tender, non-distended. No organomegaly.   Extremities:              Warm and well perfused.  Lymphatics:   No palpable LAD.  Neurological:               Nonfocal and grossly intact.   Musculoskeletal:         Positive for decreased range of motion in bilateral shoulders due to discomfort.    Lab Results   Component Value Date    WBC 0.8 (L) 02/27/2020    HGB 7.5 (L) 02/27/2020    HCT 20.6 (L) 02/27/2020    PLT 9 (LL) 02/27/2020  Lab Results   Component Value Date    NA 137 02/27/2020    K 3.7 02/27/2020    CL 106 02/27/2020    CO2 25.0 02/27/2020    BUN 15 02/27/2020    CREATININE 0.93 (H) 02/27/2020    GLU 197 (H) 02/27/2020    CALCIUM 9.3 02/27/2020    MG 1.5 (L) 02/27/2020    PHOS 3.9 02/23/2020       Lab Results   Component Value Date    BILITOT 0.6 02/27/2020    BILIDIR 0.20 02/23/2020    PROT 6.0 02/27/2020    ALBUMIN 3.6 02/27/2020    ALT 61 (H) 02/27/2020    AST 39 (H) 02/27/2020 ALKPHOS 138 (H) 02/27/2020    GGT 22 01/09/2020       Lab Results   Component Value Date    INR 1.51 02/23/2020    APTT 46.4 (H) 02/23/2020     Assessment and Plan:  Susan Kane is 59 yo woman with CLL as well as AA/bone marrow failure. She failed Rituxan and ATG/CSA and then tacrolimus. Her blood counts improve somewhat in response to tacrolimus but only that is far from normal and far from being able to sustain future treatment for CLL. While the treatment modalities for CLL have not been exhausted, patient's marrow reserve could not support further CLL therapy.     CLL: The CT of C/A/P on 10/10/19 revealed no evidence of LAD or organomegaly. There was possible endometrial fluid or thickening in the fundus of the uterus.  The BMBx on 10/07/19 showed:   Scant bone marrow sampling (<5% cellular) essentially devoid of hematopoietic elements.  -  Persistent chronic lymphocytic leukemia, representing 23% of marrow cellularity by flow cytometric analysis       -  Routine cytogenetic analysis is pending.   - Corresponding CBC: WBC 1.0, ANC 0.1.     12/26/19: BMBx:   -  Hypocellular bone marrow (20% overall) involved by chronic lymphocytic leukemia/small lymphocytic lymphoma, representing approximately 20% of marrow cells by PAX5 immunohistochemistry  - Normal karyotype: 46,XX[20]    BMT:??SD (stable disease)  HCT-CI (age adjusted) 4??(active CLL, psych, age adjusted)  Conditioning:  1. Rituximab 375 mg/m2 on day -13 and 1000 mg/m2 on D-6, +1, and +8  2. Bendamustine 130 mg/m2 IV daily over 10 minutes on D-5 through -3  3. Fludarabine 30 mg/m2 IV daily over 30 minutes on D-5 through -3    Donor: 10/10, ABO ), CMV negative  Engraftment: Granix starting D+12 through neutrophil recovery (as defined as ANC 1.0 x 2 days or 3.0 x 1 day)  - Viral studies showed weakly positive EBV, CMV, HHV-6, plan to monitor for now  - BMBx results showed hypocellular marrow without evidence of hematopiesis. Chimerism studies are still pending  GvHD prophylaxis:   1. Tacrolimus 0.045 mg/kg PO BID starting D-3 followed by 0.03 mg/kg PO BID starting on D-1 with a goal of 5-10 ng/mL  ??** Current dose 1/0.5mg  BID  2. Methotrexate 5 mg/m2 on D+1, +3, +6 and +11  3. rATG 1 mg/kg on D-2 and -1 included    Heme:??On Granix.  Dose to be given today.  Plt is 9K. Will give a unit today.     - Transfusion criteria: Transfuse 1 unit of PRBCs for Hgb<7 and 1 unit of platelets for Plt <10K or bleeding. No history of transfusion reactions.     ID:??  Prophylaxis:  - Antiviral: Continue Valtrex 500 mg po daily on  admit  - Antifungal: Posaconazole 300 mg daily  - Antibacterial: Will use cefdinir 300 BID for prophy. May be able to discontinue once ANC reaches 1,000 consistently.   ** Hx of nausea with levofloxacin when given with CNI and higher dose of Trintellix, so unclear which agent caused nausea. Patient ok with re-challenging with levofloxacin with plan to switch to cefdinir if she develops significant nausea while on levofloxacin  - PJP: Bactrim DS once daily MWF upon platelet engraftment. ??S/p pentamidine on 02/21/20. ??    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     CINV:  - Anti-emetics per BMT protocol.   - Zofran works well for her, scheduled ODT q 8 hr (6/22)  - Nightly zyprexa (added 7/2)    Diarrhea: Resolved.   Constipation:??Changing miralax to PRN, senna at bedtime Ok to add colace.    Renal:   -??Cr??0.9; ??baseline of ~ 0.7    FEN: ??Electrolyte replacement per protocol  - Continue folic acid.    Endocrine:  Hypothyroid:??  - Continue Synthroid daily.     Hepatic:??  - VOD prophylaxis not warranted as non-myeloablative regimen.   ??  Hepatocellular liver injury pattern:  - Mild increase in AST, ALT, and AlkPhos, likely secondary to posaconazole  - Follow up repeat viral studies on Monday     CV:  HTN: currently not on treatment. Pulm:  AHRF: First noted on 7/22. Intermittent O2 requirement with exam findings concerning for LLL crackles. No signs of volume overload on exam.  - Chest-X ray x2 clear  - Assess O2 requirement with walk test  - Consider CT Chest w/o contrast if persistent    Neuro/Pain:??  - Oxycodone 5 mg po prn    Psych:??Has a home psychiatrist.   Depression/Anxiety:   - Continue Xanax 0.25 mg BID PRN for anxiety  - Continue Lithium 300mg  nightly  - Continue Trinellix 10mg  nightly (patient will bring her home supply).  - Psych consult PRN.     Insomnia:  - Ambien 5mg  nightly.     Caregiver/Lodging:  - Husband [name] will be primary caregiver  - Plan to stay in local rental post transplant??    Summary:  Plt in infusion today.   We will have her come on Sunday. May need PRBCs on Sunday. Type and screen sent as well.  Will send plt ab screen. She holds on to plts poorly.   No med changes today.           Westley Hummer ANP-BC, AOCNP  [facility]    I personally spent 75 minutes face-to-face and non-face-to-face in the care of this patient, which includes all pre, intra, and post visit time on the date of service.

## 2020-02-27 NOTE — Unmapped (Addendum)
Ok to use colace.  Ok to move around once a day meds to decrease pill burden.  We will have you back Sunday.  We will do platelets today.      Recent Results (from the past 24 hour(s))   Prepare Platelet Pheresis    Collection Time: 02/26/20  7:00 PM   Result Value Ref Range    Unit Blood Type O Neg     ISBT Number 9500     Unit # [id number]     Status Transfused     Product ID Platelets     PRODUCT CODE [id number]    Prepare Platelet Pheresis    Collection Time: 02/26/20  7:00 PM   Result Value Ref Range    Unit Blood Type B Neg     ISBT Number 1700     Unit # [id number]     Status Transfused     Product ID Platelets     PRODUCT CODE [id number]    CBC w/ Differential    Collection Time: 02/27/20 12:30 PM   Result Value Ref Range    WBC 0.8 (L) 4.5 - 11.0 10*9/L    RBC 2.56 (L) 4.00 - 5.20 10*12/L    HGB 7.5 (L) 12.0 - 16.0 g/dL    HCT 69.6 (L) 29.5 - 46.0 %    MCV 80.2 80.0 - 100.0 fL    MCH 29.4 26.0 - 34.0 pg    MCHC 36.7 31.0 - 37.0 g/dL    RDW 28.4 13.2 - 44.0 %    MPV 11.3 (H) 7.0 - 10.0 fL    Platelet 9 (LL) 150 - 440 10*9/L    Variable HGB Concentration Slight (A) Not Present    Neutrophils % 54.9 %    Lymphocytes % 13.0 %    Monocytes % 16.8 %    Eosinophils % 0.3 %    Basophils % 0.8 %    Absolute Neutrophils 0.5 (L) 2.0 - 7.5 10*9/L    Absolute Lymphocytes 0.1 (L) 1.5 - 5.0 10*9/L    Absolute Monocytes 0.1 (L) 0.2 - 0.8 10*9/L    Absolute Eosinophils 0.0 0.0 - 0.4 10*9/L    Absolute Basophils 0.0 0.0 - 0.1 10*9/L    Large Unstained Cells 14 (H) 0 - 4 %    Hyperchromasia Slight (A) Not Present

## 2020-02-28 LAB — CMV VIRAL LD: Lab: 0

## 2020-02-28 LAB — TACROLIMUS, TROUGH: Lab: 6.2

## 2020-02-28 LAB — CMV DNA, QUANTITATIVE, PCR: CMV QUANT: 299 [IU]/mL — ABNORMAL HIGH (ref <0)

## 2020-02-28 NOTE — Unmapped (Signed)
BMT CLINIC FOLLOW UP    Susan Kane  59 y.o. 1961-04-04   BMT MD:  Lanae Boast, MD    Referring Oncologist: Dr. Jillyn Hidden [facility]) [name] [phone]  Date: 02/24/20    Disease: CLL  Current disease status: SD (stable disease)  Type of Transplant:??RIC MUD Allo  Graft Source: Cryopreserved PBSCs  Transplant Day:??D+42??(02/25/20)  ??  Study Participant: BMT CTN 1704: Composite Health Assessment Risk Model for Older Adults: Applying Pre-transplant Comorbidity, Geriatric Assessment and Biomarkers to Predict Non-Relapse Mortality After Allogeneic Transplant (CHARM).  ??  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 She remains on eltrobopag and Granix.   ??  Clinically she is doing well. Remains quite fatigued but able to perform all ADLs.   Appetite is poor but able to maintain reasonable oral intake.   Denies cough, congestion, shortness of breath, wheezing, or sore throat. Denies headaches, lightheadedness, weakness. Denies fever, chills, weight loss, night sweats. Denies abdominal pain, nausea, vomiting, constipation, or diarrhea.  ??  ROS:  Comprehensive ROS negative except pertinent positives listed in interval history.     Oncology History Overview Note   CLL:    Summary as per Dr. Kalman Drape most recent note with minor edits/additions from my review of records    1.??CLL?????diagnosed in August 2010, flow cytometry consistent with CLL  ?? Enlarged left??inguinal lymph node January 2019,??small??neck/axillary nodes and palpable splenomegaly 09/11/2017  ?? CTs??on 09/17/2017-3 cm necrotic appearing lymph node in the left inguinal region, borderline enlarged pelvic/retroperitoneal, chest, and axillary nodes. ??Mild splenomegaly.  ?? Ultrasound-guided biopsy of the left inguinal lymph node 09/18/2017, slightly purulent fluid aspirated, core biopsy is consistent with an atypical lymphoid proliferation???extensive necrosis with surrounding epithelioid histiocytes, limited intact lymphoid tissue involved with CLL  ?? Incisional biopsy of a necrotic/purulent left inguinal lymph node on 10/01/2017???extensive necrosis with granulomatous inflammation, small amount of viable lymphoid tissue involved with CLL, AFB and fungal stains negative  ?? Peripheral blood FISH analysis 02/05/2018??? +deletion 13q14, no evidence of p53 (17p13) deletion, no evidence of 11q22??deletion  - normal karyotype (46,XX)  ?? Bone marrow biopsy 02/26/2018???hypercellular marrow with extensive involvement by CLL, lymphocytes represent??85% of all cells  ?? Ibrutinib initiated 04/03/2018  ?? Ibrutinib placed on hold 04/11/2018 due to onset of arthralgias  ?? Ibrutinib resumed 04/16/2018, discontinued 04/25/2018 secondary to severe arthralgias/arthritis  ?? Ibrutinib resumed at a dose of 140 mg daily 05/03/2018  ?? Ibrutinib dose adjusted to 140 mg alternating with 280 mg 06/25/2018  ?? 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 ??Augmentin prescribed.  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  Final Diagnosis   (Outside Case #:  [id number], dated 02/26/2018)  Bone marrow, aspiration and biopsy  -  Hypercellular bone marrow (80%) with extensive involvement by chronic lymphocytic leukemia (87% lymphocytes by manual aspirate differential)  (See Comment)  -  By outside report, cytogenetic results are normal     Karyotype: 36, XX and FISH with 13q del but no 11q or 17p del    Repeat BM bx: (done after being off ibrutinib x1 month)  80%  involvement by CLL    08/13/18: Presents to [facility] to discuss plan of care     Chronic lymphocytic leukemia (CLL), B-cell (CMS-HCC)   07/12/2018 Initial Diagnosis    Chronic lymphocytic leukemia (CLL), B-cell (CMS-HCC)     08/26/2018 -  Chemotherapy    Acalabrutinib started (approximate date)     11/14/2018 -  Chemotherapy    Acalabrutinib discontinued due to progressive bone marrow failure     12/06/2018 -  Chemotherapy    Ritux x1 given due to CLL still making up majority of BM cellularity (though in the setting of near aplasia of rest of TLH)     03/19/2019 -  Chemotherapy    Equine ATG / CsA for aplastic anemia (suspect 2/t acalabrutinib)     Chronic lymphocytic leukemia of B-cell type not having achieved remission (CMS-HCC)   01/21/2019 Initial Diagnosis    Chronic lymphocytic leukemia of B-cell type not having achieved remission (CMS-HCC)     01/01/2020 -  Chemotherapy    BMT IP BENDAMUSTINE / FLUDARABINE / RITUXIMAB (OP RITUXIMAB Days -13 & -6) (MUD)  bendamustine 130 mg/m2 IV on days -5, -4, -3   fludarabine 30 mg/m2 IV on days -5, -4, -3   riTUXimab 375 mg/m2 IV on day -13 as a OP treatment day, then 1,000 mg/m2 on days -6 as a OP treatment day, +1, +8   tacrolimus, 0.045 mg/kg PO BID on days -3 and -2 then  0.03 mg/kg PO BID starting on day -1   methotrexate 5 mg/m2 IV on days +1, +3, +6, +11   tbo-filgrastim 300 mcg (<= 75 kg) / 480 mcg (> 75 kg) q24h starting on day +7        Past Medical History:   Diagnosis Date   ??? Anxiety    ??? Depression    ??? Hypothyroid      Past Surgical History:   Procedure Laterality Date   ??? BONE MARROW BIOPSY & ASPIRATION  02/10/2020        ??? CESAREAN SECTION         Current Outpatient Medications:   ???  cefdinir (OMNICEF) 300 MG capsule, Take 1 capsule (300 mg total) by mouth Two (2) times a day., Disp: 30 capsule, Rfl: 0  ???  eltrombopag (PROMACTA) 50 MG tablet, Take 2 tablets (100 mg total) by mouth daily. Administer on an empty stomach, 1 hour before or 2 hours after a meal., Disp: 60 tablet, Rfl: 2  ???  famotidine (PEPCID) 20 MG tablet, Take 1 tablet (20 mg total) by mouth Two (2) times a day., Disp: 60 tablet, Rfl: 0  ???  folic acid (FOLVITE) 1 MG tablet, Take 1 tablet (1 mg total) by mouth at bedtime., Disp: 30 tablet, Rfl: 5  ???  levothyroxine (SYNTHROID) 100 MCG tablet, Take 1 tablet (100 mcg total) by mouth daily., Disp: 30 tablet, Rfl: 5  ???  lithium (LITHOBID) 300 MG ER tablet, Take 1 tablet (300 mg total) by mouth at bedtime., Disp: 30 tablet, Rfl: 5  ???  magnesium oxide-Mg AA chelate (MAGNESIUM, AMINO ACID CHELATE,) 133 mg Tab, Take 1 tablet by mouth Two (2) times a day., Disp: 100 tablet, Rfl: 2  ???  OLANZapine zydis (ZYPREXA) 5 MG disintegrating tablet, Take 1 tablet (5 mg total) by mouth nightly., Disp: 30 tablet, Rfl: 2  ???  ondansetron (ZOFRAN-ODT) 8 MG disintegrating tablet, Dissolve  1 tablet (8 mg total) by mouth every eight (8) hours as needed for nausea., Disp:  30 tablet, Rfl: 2  ???  posaconazole (NOXAFIL) 100 mg TbEC delayed released tablet, Take 3 tablets (300 mg total) by mouth daily., Disp: 90 tablet, Rfl: 1  ???  tacrolimus (PROGRAF) 0.5 MG capsule, Take 3 capsules (1.5 mg total) by mouth two (2) times a day., Disp: 180 capsule, Rfl: 5  ???  TRINTELLIX 10 mg tablet, Take 1 tablet (10 mg total) by mouth at bedtime., Disp: 30 tablet, Rfl: 5  ???  valACYclovir (VALTREX) 500 MG tablet, Take 1 tablet (500 mg total) by mouth daily., Disp: 30 tablet, Rfl: 11  ???  zolpidem (AMBIEN) 10 mg tablet, Take 1 tablet (10 mg total) by mouth nightly as needed for sleep., Disp: 20 tablet, Rfl: 0  No current facility-administered medications for this visit.    Facility-Administered Medications Ordered in Other Visits:   ???  heparin, porcine (PF) 100 unit/mL injection 200 Units, 200 Units, Intravenous, Q30 Min PRN, Pernell Dupre, MD, 200 Units at 02/27/20 1230  ???  heparin, porcine (PF) 100 unit/mL injection 200 Units, 200 Units, Intravenous, Q30 Min PRN, Pernell Dupre, MD Physical exam:  72, Cares for self; unable to carry on normal activity or to do active work.  There were no vitals taken for this visit.  General Appearance:   No acute distress, looks comfortable. Much more animated than before.   HEENT:                        Dentition No visible caries, gum disease, or infection. Oral mucosa is pink and moist.  Lungs:                Clear to auscultation bilaterally  Heart:                           Regular rate and rhythm  Abdomen:                Soft, non-tender, non-distended. No organomegaly.   Extremities:              Warm and well perfused.  Lymphatics:   No palpable LAD.  Neurological:               Nonfocal and grossly intact.   Musculoskeletal:         Positive for decreased range of motion in bilateral shoulders due to discomfort.    Lab Results   Component Value Date    WBC 0.8 (L) 02/27/2020    HGB 7.5 (L) 02/27/2020    HCT 20.6 (L) 02/27/2020    PLT 16 (L) 02/27/2020       Lab Results   Component Value Date    NA 137 02/27/2020    K 3.7 02/27/2020    CL 106 02/27/2020    CO2 25.0 02/27/2020    BUN 15 02/27/2020    CREATININE 0.93 (H) 02/27/2020    GLU 197 (H) 02/27/2020    CALCIUM 9.3 02/27/2020    MG 1.5 (L) 02/27/2020    PHOS 3.9 02/23/2020       Lab Results   Component Value Date    BILITOT 0.6 02/27/2020    BILIDIR 0.20 02/23/2020    PROT 6.0 02/27/2020    ALBUMIN 3.6 02/27/2020    ALT 61 (H) 02/27/2020    AST 39 (H) 02/27/2020    ALKPHOS 138 (H) 02/27/2020    GGT 22 01/09/2020  Lab Results   Component Value Date    INR 1.51 02/23/2020    APTT 46.4 (H) 02/23/2020     Assessment and Plan:  Susan Kane is 59 yo woman with CLL as well as AA/bone marrow failure. She failed Rituxan and ATG/CSA and then tacrolimus. Her blood counts improve somewhat in response to tacrolimus but only that is far from normal and far from being able to sustain future treatment for CLL. While the treatment modalities for CLL have not been exhausted, patient's marrow reserve could not support further CLL therapy.     CLL: The CT of C/A/P on 10/10/19 revealed no evidence of LAD or organomegaly. There was possible endometrial fluid or thickening in the fundus of the uterus.  The BMBx on 10/07/19 showed:   Scant bone marrow sampling (<5% cellular) essentially devoid of hematopoietic elements.  -  Persistent chronic lymphocytic leukemia, representing 23% of marrow cellularity by flow cytometric analysis       -  Routine cytogenetic analysis is pending.   - Corresponding CBC: WBC 1.0, ANC 0.1.     12/26/19: BMBx:   -  Hypocellular bone marrow (20% overall) involved by chronic lymphocytic leukemia/small lymphocytic lymphoma, representing approximately 20% of marrow cells by PAX5 immunohistochemistry  - Normal karyotype: 46,XX[20]    BMT:??SD (stable disease)  HCT-CI (age adjusted) 5??(active CLL, psych, age adjusted)  Conditioning:  1. Rituximab 375 mg/m2 on day -13 and 1000 mg/m2 on D-6, +1, and +8  2. Bendamustine 130 mg/m2 IV daily over 10 minutes on D-5 through -3  3. Fludarabine 30 mg/m2 IV daily over 30 minutes on D-5 through -3    Donor: 10/10, ABO ), CMV negative  Engraftment: Granix starting D+12 through neutrophil recovery (as defined as ANC 1.0 x 2 days or 3.0 x 1 day)  - Viral studies showed weakly positive EBV, CMV, HHV-6, plan to monitor for now  - BMBx results showed hypocellular marrow without evidence of hematopiesis. Chimerism studies are still pending  GvHD prophylaxis:   1. Tacrolimus 0.045 mg/kg PO BID starting D-3 followed by 0.03 mg/kg PO BID starting on D-1 with a goal of 5-10 ng/mL  ??** Current dose 1/0.5mg  BID  2. Methotrexate 5 mg/m2 on D+1, +3, +6 and +11  3. rATG 1 mg/kg on D-2 and -1 included    Heme:??Engrafting ANC. On Granix.   Plt tranfusion dependent. Today plt 13,000. Will check tomorrow.  - Transfusion criteria: Transfuse 1 unit of PRBCs for Hgb<7 and 1 unit of platelets for Plt <10K or bleeding. No history of transfusion reactions.     ID:??  Prophylaxis:  - Antiviral: Continue Valtrex 500 mg po daily on admit  - Antifungal: Posaconazole 300 mg daily  - Antibacterial: Will use cefdinir 300 BID for prophy. May be able to discontinue once ANC reaches 1,000.   ** Hx of nausea with levofloxacin when given with CNI and higher dose of Trintellix, so unclear which agent caused nausea. Patient ok with re-challenging with levofloxacin with plan to switch to cefdinir if she develops significant nausea while on levofloxacin  - PJP: Bactrim DS once daily MWF upon platelet engraftment. ??S/p pentamidine on 02/21/20. ??    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     CINV:  - Anti-emetics per BMT protocol.   - Zofran works well for  her, scheduled ODT q 8 hr (6/22)  - Nightly zyprexa (added 7/2)    Diarrhea: Resolved.   Constipation:??Changing miralax to PRN, senna at bedtime    Renal:   -??Cr??now improved to??baseline of ~ 0.7  - Strict I&O, avoid nephrotoxins    FEN: ??Electrolyte replacement per protocol  - Continue folic acid.    Endocrine:  Hypothyroid:??  - Continue Synthroid daily.     Hepatic:??  - VOD prophylaxis not warranted as non-myeloablative regimen.   ??  Hepatocellular liver injury pattern:  - Mild increase in AST, ALT, and AlkPhos, likely secondary to posaconazole  - Follow up repeat viral studies on Monday     CV:  HTN: Improved on 5 mg amlodipine.     Pulm:  AHRF: First noted on 7/22. Intermittent O2 requirement with exam findings concerning for LLL crackles. No signs of volume overload on exam.  - Chest-X ray x2 clear  - Assess O2 requirement with walk test  - Consider CT Chest w/o contrast if persistent    Neuro/Pain:??  - Oxycodone 5 mg po prn    Psych:??Has a home psychiatrist.   Depression/Anxiety:   - Continue Xanax 0.25 mg BID PRN for anxiety  - Continue Lithium 300mg  nightly  - Continue Trinellix 10mg  nightly (patient will bring her home supply).  - Psych consult PRN.     Insomnia:  - Ambien 5mg  nightly.     Caregiver/Lodging:  - Husband [name] will be primary caregiver  - Plan to stay in local rental post transplant??      Lanae Boast, MD  Associate Professor  Hematology [facility]

## 2020-02-28 NOTE — Unmapped (Signed)
Encounter addended by: Georgena Spurling, RN on: 02/27/2020 6:01 PM   Actions taken: Patient Education title added, Patient Education documented on

## 2020-02-28 NOTE — Unmapped (Signed)
Pt to clinic for lab check.  Platelets 9 today, will transfuse x1 unit of platelets.  Platelets increased to 16, pt opt to go home rather than receive and additional bag of platelets.  Pt will return on Sunday for lab checks.   Pt ambulatory with daughter.

## 2020-02-28 NOTE — Unmapped (Signed)
Bone Marrow Transplant and Cellular Therapy Program  Immunosuppressive Therapy Note    Susan Kane is a 59 y.o. 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 Susan Kane is currently day +45 (as of 8/7).    Current dose: tacrolimus 1.5 mg PO BID    Goal tacrolimus Level: 5-10 ng/mL    Resulted level: 6.2 ng/mL drawn at 12:30 on 8/6, resulted on 8/7    Lab Results   Component Value Date/Time    TACROLIMUS 6.2 02/27/2020 12:30 PM    TACROLIMUS 8.0 02/24/2020 10:20 AM    TACROLIMUS 7.6 02/23/2020 09:01 AM    TACROLIMUS 6.5 02/21/2020 08:15 AM     Lab Results   Component Value Date/Time    CREATININE 0.93 (H) 02/27/2020 12:30 PM    CREATININE 0.86 (H) 02/24/2020 10:20 AM    CREATININE 0.74 02/23/2020 12:00 AM    CREATININE 0.90 (H) 02/22/2020 12:06 AM      Assessment/Recommendation: Tacrolimus level is therapeutic. Level was drawn a few hours late, so true tough is likely higher and closer to upper end of target goal range. Her SCr is up slightly, but stable overall. LFTs are mildly elevated, TBili WNL. Given the above, plan to continue current tacrolimus dose of 1.5 mg PO BID and will recheck at next clinic visit to further assess trend and need for adjustment.    We will continue to monitor levels.  Patient will be followed for changes in renal and hepatic function, toxicity, and efficacy.     Berneta Levins, CPP  [facility]

## 2020-02-28 NOTE — Unmapped (Signed)
Patient Education        Learning About Blood Transfusions  What is a blood transfusion?     Blood transfusion is a medical treatment to replace the blood or parts of blood that your body has lost. The blood goes through a tube from a bag to an intravenous (IV) catheter and into your vein.  You may need a blood transfusion after losing blood from an injury, a major surgery, an illness that causes bleeding, or an illness that destroys blood cells.  Transfusions are also used to give you the parts of blood???such as platelets, plasma, or substances that cause clotting???that your body needs to fight an illness or stop bleeding.  How is a blood transfusion done?  Before you receive a blood transfusion, your blood is tested to find out what your blood type is. Blood or blood parts that are a match with your blood type are ordered by your doctor. Blood is typed as A, B, AB, or O. It is also typed as Rh-positive or Rh-negative.  Your blood is also screened to look for antibodies that might react with the blood that is given to you. The blood you are getting is checked and rechecked to make sure that it's the right type for you.  A sample of your blood is mixed with a sample of the blood you will receive to check for problems. Before actually giving you the transfusion, a doctor and nurses will look at the label on the package of blood and compare it to your hospital ID bracelet and medical records. The transfusion begins only when all agree that this is the correct blood and that you are the correct person to receive it.  To receive the transfusion, you will have an intravenous (IV) catheter inserted into a vein. A tube connects the catheter to the bag containing the blood, which is placed higher than your body. The blood then flows slowly into your vein. A doctor or nurse will check you several times during the transfusion to watch for a reaction or other problems.  What are the possible risks?  Blood transfusions have many benefits and are often life-saving. But they also have a few risks. Possible risks include:  ?? Your body's reaction to receiving new blood. This may include:  ? Fever.  ? Breathing problems.  ? Allergic reaction, such as hives, swelling, or a new rash.  ?? An infection from the blood. This risk is small because of the strict rules placed on handling and storing blood. Getting a viral infection, such as HIV or hepatitis B or C, through blood transfusions has become very rare. The U.S. Food and Drug Administration (FDA) enforces strict guidelines on the collection, testing, storage, and use of blood.  ?? Getting the wrong blood type by accident. Severe reactions, which can be life-threatening, are very rare.  ?? An infection at the transfusion site, such as redness, swelling, pain, bleeding, or pus.  How can you care for yourself at home?  To prevent infection at the transfusion site  ?? Wash the area daily with warm, soapy water, and pat it dry. Don't use hydrogen peroxide or alcohol, which can slow healing. You may cover the area with a gauze bandage if it weeps or rubs against clothing. Change the bandage every day.  ?? Keep the area clean and dry.  When should you call for help?  Call 911 anytime you think you may need emergency care. For example, call if:  ??  You have severe trouble breathing.  Call your doctor now or seek immediate medical care if:  ?? You have signs of an allergic reaction, such as hives, swelling, or a new rash.  ?? You have a fever.  ?? You feel weaker or more tired than usual.  ?? You have a yellow tint to your skin or the whites of your eyes.  ?? You have signs of an infection at the transfusion site, such as redness, swelling, pain, bleeding, or pus.  Watch closely for changes in your health, and be sure to contact your doctor if you have any problems.  Follow-up care is a key part of your treatment and safety. Be sure to make and go to all appointments, and call your doctor if you are having problems. It's also a good idea to know your test results and keep a list of the medicines you take.  Where can you learn more?  Go to [facility] at https://myuncchart.org  Select Patient Education under [employer]. Enter V588 in the search box to learn more about Learning About Blood Transfusions.  Current as of: April 16, 2019??????????????????????????????Content Version: 12.9  ?? 2006-2021 [employer].   Care instructions adapted under license by [facility]. If you have questions about a medical condition or this instruction, always ask your healthcare professional. [employer], Inc[employer] disclaims any warranty or liability for your use of this information.         Hospital Outpatient Visit on 02/27/2020   Component Date Value Ref Range Status   ??? Platelet 02/27/2020 16* 150 - 440 10*9/L Final   ??? Unit Blood Type 02/27/2020 O Neg   Final   ??? ISBT Number 02/27/2020 9500   Final   ??? Unit # 02/27/2020 [id number]   Final   ??? Status 02/27/2020 Issued   Final   ??? Product ID 02/27/2020 Platelets   Final   ??? PRODUCT CODE 02/27/2020 [id number]   Final   Lab on 02/27/2020   Component Date Value Ref Range Status   ??? Magnesium 02/27/2020 1.5* 1.6 - 2.6 mg/dL Final   ??? Sodium 78/46/9629 137  135 - 145 mmol/L Final   ??? Potassium 02/27/2020 3.7  3.4 - 4.5 mmol/L Final   ??? Chloride 02/27/2020 106  98 - 107 mmol/L Final   ??? Anion Gap 02/27/2020 6  5 - 14 mmol/L Final   ??? CO2 02/27/2020 25.0  20.0 - 31.0 mmol/L Final   ??? BUN 02/27/2020 15  9 - 23 mg/dL Final   ??? Creatinine 02/27/2020 0.93* 0.60 - 0.80 mg/dL Final   ??? BUN/Creatinine Ratio 02/27/2020 16   Final   ??? EGFR CKD-EPI Non-African American,* 02/27/2020 67  >=60 mL/min/1.23m2 Final   ??? EGFR CKD-EPI African American, Fem* 02/27/2020 77  >=60 mL/min/1.59m2 Final   ??? Glucose 02/27/2020 197* 70 - 179 mg/dL Final   ??? Calcium 52/84/1324 9.3  8.7 - 10.4 mg/dL Final   ??? Albumin 40/04/2724 3.6  3.4 - 5.0 g/dL Final   ??? Total Protein 02/27/2020 6.0  5.7 - 8.2 g/dL Final   ??? Total Bilirubin 02/27/2020 0.6  0.3 - 1.2 mg/dL Final   ??? AST 36/64/4034 39* <=34 U/L Final   ??? ALT 02/27/2020 61* 10 - 49 U/L Final   ??? Alkaline Phosphatase 02/27/2020 138* 46 - 116 U/L Final   ??? WBC 02/27/2020 0.8* 4.5 - 11.0 10*9/L Final   ??? RBC 02/27/2020 2.56* 4.00 - 5.20 10*12/L Final   ??? HGB 02/27/2020 7.5*  12.0 - 16.0 g/dL Final   ??? HCT 60/45/4098 20.6* 36.0 - 46.0 % Final   ??? MCV 02/27/2020 80.2  80.0 - 100.0 fL Final   ??? MCH 02/27/2020 29.4  26.0 - 34.0 pg Final   ??? MCHC 02/27/2020 36.7  31.0 - 37.0 g/dL Final   ??? RDW 11/91/4782 12.8  12.0 - 15.0 % Final   ??? MPV 02/27/2020 11.3* 7.0 - 10.0 fL Final   ??? Platelet 02/27/2020 9* 150 - 440 10*9/L Final   ??? Variable HGB Concentration 02/27/2020 Slight* Not Present Final   ??? Neutrophils % 02/27/2020 54.9  % Final   ??? Lymphocytes % 02/27/2020 13.0  % Final   ??? Monocytes % 02/27/2020 16.8  % Final   ??? Eosinophils % 02/27/2020 0.3  % Final   ??? Basophils % 02/27/2020 0.8  % Final   ??? Absolute Neutrophils 02/27/2020 0.5* 2.0 - 7.5 10*9/L Final   ??? Absolute Lymphocytes 02/27/2020 0.1* 1.5 - 5.0 10*9/L Final   ??? Absolute Monocytes 02/27/2020 0.1* 0.2 - 0.8 10*9/L Final   ??? Absolute Eosinophils 02/27/2020 0.0  0.0 - 0.4 10*9/L Final   ??? Absolute Basophils 02/27/2020 0.0  0.0 - 0.1 10*9/L Final   ??? Large Unstained Cells 02/27/2020 14* 0 - 4 % Final   ??? Hyperchromasia 02/27/2020 Slight* Not Present Final   ??? Smear Review Comments 02/27/2020 See Comment* Undefined Final    Slide reviewed     ??? Dohle Bodies 02/27/2020 Present* Not Present Final   ??? Toxic Granulation 02/27/2020 Present* Not Present Final

## 2020-02-29 ENCOUNTER — Encounter: Admit: 2020-02-29 | Discharge: 2020-03-01 | Payer: PRIVATE HEALTH INSURANCE

## 2020-02-29 DIAGNOSIS — Z9221 Personal history of antineoplastic chemotherapy: Secondary | ICD-10-CM | POA: Diagnosis not present

## 2020-02-29 DIAGNOSIS — F419 Anxiety disorder, unspecified: Secondary | ICD-10-CM | POA: Diagnosis not present

## 2020-02-29 DIAGNOSIS — B259 Cytomegaloviral disease, unspecified: Secondary | ICD-10-CM | POA: Diagnosis not present

## 2020-02-29 DIAGNOSIS — R509 Fever, unspecified: Secondary | ICD-10-CM | POA: Diagnosis not present

## 2020-02-29 DIAGNOSIS — B1910 Unspecified viral hepatitis B without hepatic coma: Secondary | ICD-10-CM | POA: Diagnosis not present

## 2020-02-29 DIAGNOSIS — R590 Localized enlarged lymph nodes: Secondary | ICD-10-CM | POA: Diagnosis not present

## 2020-02-29 DIAGNOSIS — Z9484 Stem cells transplant status: Secondary | ICD-10-CM | POA: Diagnosis not present

## 2020-02-29 DIAGNOSIS — F329 Major depressive disorder, single episode, unspecified: Secondary | ICD-10-CM | POA: Diagnosis not present

## 2020-02-29 DIAGNOSIS — Z20822 Contact with and (suspected) exposure to covid-19: Secondary | ICD-10-CM | POA: Diagnosis not present

## 2020-02-29 DIAGNOSIS — E039 Hypothyroidism, unspecified: Secondary | ICD-10-CM | POA: Diagnosis not present

## 2020-02-29 DIAGNOSIS — Z6820 Body mass index (BMI) 20.0-20.9, adult: Secondary | ICD-10-CM | POA: Diagnosis not present

## 2020-02-29 DIAGNOSIS — D709 Neutropenia, unspecified: Secondary | ICD-10-CM | POA: Diagnosis not present

## 2020-02-29 DIAGNOSIS — K769 Liver disease, unspecified: Secondary | ICD-10-CM | POA: Diagnosis not present

## 2020-02-29 DIAGNOSIS — D696 Thrombocytopenia, unspecified: Secondary | ICD-10-CM | POA: Diagnosis not present

## 2020-02-29 DIAGNOSIS — D619 Aplastic anemia, unspecified: Secondary | ICD-10-CM | POA: Diagnosis not present

## 2020-02-29 DIAGNOSIS — R5081 Fever presenting with conditions classified elsewhere: Secondary | ICD-10-CM | POA: Diagnosis not present

## 2020-02-29 DIAGNOSIS — Z792 Long term (current) use of antibiotics: Secondary | ICD-10-CM | POA: Diagnosis not present

## 2020-02-29 DIAGNOSIS — C9110 Chronic lymphocytic leukemia of B-cell type not having achieved remission: Secondary | ICD-10-CM | POA: Diagnosis not present

## 2020-02-29 DIAGNOSIS — D801 Nonfamilial hypogammaglobulinemia: Secondary | ICD-10-CM | POA: Diagnosis not present

## 2020-02-29 LAB — CBC W/ AUTO DIFF
BASOPHILS ABSOLUTE COUNT: 0 10*9/L (ref 0.0–0.1)
BASOPHILS ABSOLUTE COUNT: 0 10*9/L (ref 0.0–0.1)
BASOPHILS RELATIVE PERCENT: 0.4 %
BASOPHILS RELATIVE PERCENT: 0.5 %
EOSINOPHILS ABSOLUTE COUNT: 0 10*9/L (ref 0.0–0.4)
EOSINOPHILS ABSOLUTE COUNT: 0 10*9/L (ref 0.0–0.4)
EOSINOPHILS RELATIVE PERCENT: 0.3 %
EOSINOPHILS RELATIVE PERCENT: 0.2 %
HEMATOCRIT: 17.7 % — ABNORMAL LOW (ref 36.0–46.0)
HEMATOCRIT: 21.8 % — ABNORMAL LOW (ref 36.0–46.0)
HEMOGLOBIN: 6.4 g/dL — ABNORMAL LOW (ref 12.0–16.0)
HEMOGLOBIN: 8.2 g/dL — ABNORMAL LOW (ref 12.0–16.0)
LARGE UNSTAINED CELLS: 13 % — ABNORMAL HIGH (ref 0–4)
LARGE UNSTAINED CELLS: 11 % — ABNORMAL HIGH (ref 0–4)
LYMPHOCYTES ABSOLUTE COUNT: 0.1 10*9/L — ABNORMAL LOW (ref 1.5–5.0)
LYMPHOCYTES ABSOLUTE COUNT: 0.1 10*9/L — ABNORMAL LOW (ref 1.5–5.0)
LYMPHOCYTES RELATIVE PERCENT: 18 %
LYMPHOCYTES RELATIVE PERCENT: 8.5 %
MEAN CORPUSCULAR HEMOGLOBIN CONC: 35.9 g/dL (ref 31.0–37.0)
MEAN CORPUSCULAR HEMOGLOBIN: 28.9 pg (ref 26.0–34.0)
MEAN CORPUSCULAR HEMOGLOBIN: 29.5 pg (ref 26.0–34.0)
MEAN CORPUSCULAR VOLUME: 80.7 fL (ref 80.0–100.0)
MEAN CORPUSCULAR VOLUME: 78.6 fL — ABNORMAL LOW (ref 80.0–100.0)
MEAN PLATELET VOLUME: 9.1 fL (ref 7.0–10.0)
MEAN PLATELET VOLUME: 10 fL (ref 7.0–10.0)
MONOCYTES ABSOLUTE COUNT: 0.1 10*9/L — ABNORMAL LOW (ref 0.2–0.8)
MONOCYTES RELATIVE PERCENT: 12 %
MONOCYTES RELATIVE PERCENT: 16.9 %
NEUTROPHILS ABSOLUTE COUNT: 0.4 10*9/L — CL (ref 2.0–7.5)
NEUTROPHILS RELATIVE PERCENT: 56 %
NEUTROPHILS RELATIVE PERCENT: 63.3 %
PLATELET COUNT: 6 10*9/L — CL (ref 150–440)
RED BLOOD CELL COUNT: 2.77 10*12/L — ABNORMAL LOW (ref 4.00–5.20)
RED CELL DISTRIBUTION WIDTH: 13.1 % (ref 12.0–15.0)
RED CELL DISTRIBUTION WIDTH: 14.1 % (ref 12.0–15.0)
WBC ADJUSTED: 0.8 10*9/L — ABNORMAL LOW (ref 4.5–11.0)

## 2020-02-29 LAB — COMPREHENSIVE METABOLIC PANEL
ALBUMIN: 3.4 g/dL (ref 3.4–5.0)
ALKALINE PHOSPHATASE: 125 U/L — ABNORMAL HIGH (ref 46–116)
ALT (SGPT): 79 U/L — ABNORMAL HIGH (ref 10–49)
AST (SGOT): 53 U/L — ABNORMAL HIGH (ref <=34)
BILIRUBIN TOTAL: 0.8 mg/dL (ref 0.3–1.2)
BLOOD UREA NITROGEN: 16 mg/dL (ref 9–23)
BUN / CREAT RATIO: 20
CALCIUM: 9 mg/dL (ref 8.7–10.4)
CHLORIDE: 107 mmol/L (ref 98–107)
CREATININE: 0.81 mg/dL — ABNORMAL HIGH
EGFR CKD-EPI AA FEMALE: >90 mL/min/{1.73_m2} (ref >=60)
EGFR CKD-EPI NON-AA FEMALE: 79 mL/min/{1.73_m2} (ref >=60)
GLUCOSE RANDOM: 169 mg/dL (ref 70–179)
POTASSIUM: 4.1 mmol/L (ref 3.4–4.5)
PROTEIN TOTAL: 5.9 g/dL (ref 5.7–8.2)
SODIUM: 139 mmol/L (ref 135–145)

## 2020-02-29 LAB — PLATELET COUNT
Platelets:NCnc:Pt:Bld:Qn:Automated count: 15 — ABNORMAL LOW
Platelets:NCnc:Pt:Bld:Qn:Automated count: 16 — ABNORMAL LOW

## 2020-02-29 LAB — LARGE UNSTAINED CELLS: Lab: 13 — ABNORMAL HIGH

## 2020-02-29 LAB — URINALYSIS WITH CULTURE REFLEX
BILIRUBIN UA: NEGATIVE
GLUCOSE UA: NEGATIVE
KETONES UA: NEGATIVE
LEUKOCYTE ESTERASE UA: NEGATIVE
NITRITE UA: NEGATIVE
PH UA: 7 (ref 5.0–9.0)
PROTEIN UA: NEGATIVE
SQUAMOUS EPITHELIAL: <1 /HPF (ref 0–5)
UROBILINOGEN UA: 0.2
WBC UA: 2 /HPF (ref 0–5)

## 2020-02-29 LAB — TOXIC GRANULATION

## 2020-02-29 LAB — LACTATE BLOOD VENOUS: Lactate:SCnc:Pt:BldV:Qn:: 1.2

## 2020-02-29 LAB — BLOOD UREA NITROGEN: Urea nitrogen:MCnc:Pt:Ser/Plas:Qn:: 16

## 2020-02-29 LAB — LEUKOCYTE ESTERASE UA: Leukocyte esterase:PrThr:Pt:Urine:Ord:Test strip: NEGATIVE

## 2020-02-29 LAB — SLIDE REVIEW

## 2020-02-29 MED ADMIN — heparin, porcine (PF) 100 unit/mL injection 200 Units: 200 [IU] | INTRAVENOUS | @ 17:00:00 | Stop: 2020-03-01

## 2020-02-29 NOTE — Unmapped (Signed)
Hospital Outpatient Visit on 02/29/2020   Component Date Value Ref Range Status   ??? Reject/Recollect 02/29/2020 REJECT   Final    Duplicate order   ??? WBC 02/29/2020 0.7* 4.5 - 11.0 10*9/L Final   ??? RBC 02/29/2020 2.19* 4.00 - 5.20 10*12/L Final   ??? HGB 02/29/2020 6.4* 12.0 - 16.0 g/dL Final   ??? HCT 03/47/4259 17.7* 36.0 - 46.0 % Final   ??? MCV 02/29/2020 80.7  80.0 - 100.0 fL Final   ??? MCH 02/29/2020 28.9  26.0 - 34.0 pg Final   ??? MCHC 02/29/2020 35.9  31.0 - 37.0 g/dL Final   ??? RDW 56/38/7564 13.1  12.0 - 15.0 % Final   ??? MPV 02/29/2020 9.1  7.0 - 10.0 fL Final   ??? Platelet 02/29/2020 6* 150 - 440 10*9/L Final   ??? Variable HGB Concentration 02/29/2020 Slight* Not Present Final   ??? Neutrophils % 02/29/2020 56.0  % Final   ??? Lymphocytes % 02/29/2020 18.0  % Final   ??? Monocytes % 02/29/2020 12.0  % Final   ??? Eosinophils % 02/29/2020 0.3  % Final   ??? Basophils % 02/29/2020 0.4  % Final   ??? Absolute Neutrophils 02/29/2020 0.4* 2.0 - 7.5 10*9/L Final   ??? Absolute Lymphocytes 02/29/2020 0.1* 1.5 - 5.0 10*9/L Final   ??? Absolute Monocytes 02/29/2020 0.1* 0.2 - 0.8 10*9/L Final   ??? Absolute Eosinophils 02/29/2020 0.0  0.0 - 0.4 10*9/L Final   ??? Absolute Basophils 02/29/2020 0.0  0.0 - 0.1 10*9/L Final   ??? Large Unstained Cells 02/29/2020 13* 0 - 4 % Final   ??? Platelet 02/29/2020 15* 150 - 440 10*9/L Final   ??? Crossmatch 02/29/2020 Compatible   Final   ??? Unit Blood Type 02/29/2020 O Neg   Final   ??? ISBT Number 02/29/2020 9500   Final   ??? Unit # 02/29/2020 [id number]   Final   ??? Status 02/29/2020 Issued   Final   ??? Spec Expiration 02/29/2020 [id number]   Final   ??? Product ID 02/29/2020 Red Blood Cells   Final   ??? PRODUCT CODE 02/29/2020 [id number]   Final   ??? Unit Blood Type 02/29/2020 O Neg   Final   ??? ISBT Number 02/29/2020 9500   Final   ??? Unit # 02/29/2020 [id number]   Final   ??? Status 02/29/2020 Issued   Final   ??? Spec Expiration 02/29/2020 [id number]   Final   ??? Product ID 02/29/2020 Platelets   Final   ??? PRODUCT CODE 02/29/2020 [id number]   Final   ??? Crossmatch 02/29/2020 Compatible   Final   ??? Unit Blood Type 02/29/2020 O Neg   Final   ??? ISBT Number 02/29/2020 9500   Final   ??? Unit # 02/29/2020 [id number]   Final   ??? Status 02/29/2020 Issued   Final   ??? Spec Expiration 02/29/2020 [id number]   Final   ??? Product ID 02/29/2020 Red Blood Cells   Final   ??? PRODUCT CODE 02/29/2020 [id number]   Final       Patient Education        Learning About Blood Transfusions  What is a blood transfusion?     Blood transfusion is a medical treatment to replace the blood or parts of blood that your body has lost. The blood goes through a tube from a bag to an intravenous (IV) catheter and into your vein.  You may need a blood transfusion after losing blood  from an injury, a major surgery, an illness that causes bleeding, or an illness that destroys blood cells.  Transfusions are also used to give you the parts of blood???such as platelets, plasma, or substances that cause clotting???that your body needs to fight an illness or stop bleeding.  How is a blood transfusion done?  Before you receive a blood transfusion, your blood is tested to find out what your blood type is. Blood or blood parts that are a match with your blood type are ordered by your doctor. Blood is typed as A, B, AB, or O. It is also typed as Rh-positive or Rh-negative.  Your blood is also screened to look for antibodies that might react with the blood that is given to you. The blood you are getting is checked and rechecked to make sure that it's the right type for you.  A sample of your blood is mixed with a sample of the blood you will receive to check for problems. Before actually giving you the transfusion, a doctor and nurses will look at the label on the package of blood and compare it to your hospital ID bracelet and medical records. The transfusion begins only when all agree that this is the correct blood and that you are the correct person to receive it.  To receive the transfusion, you will have an intravenous (IV) catheter inserted into a vein. A tube connects the catheter to the bag containing the blood, which is placed higher than your body. The blood then flows slowly into your vein. A doctor or nurse will check you several times during the transfusion to watch for a reaction or other problems.  What are the possible risks?  Blood transfusions have many benefits and are often life-saving. But they also have a few risks. Possible risks include:  ?? Your body's reaction to receiving new blood. This may include:  ? Fever.  ? Breathing problems.  ? Allergic reaction, such as hives, swelling, or a new rash.  ?? An infection from the blood. This risk is small because of the strict rules placed on handling and storing blood. Getting a viral infection, such as HIV or hepatitis B or C, through blood transfusions has become very rare. The [place] Food and Drug Administration (FDA) enforces strict guidelines on the collection, testing, storage, and use of blood.  ?? Getting the wrong blood type by accident. Severe reactions, which can be life-threatening, are very rare.  ?? An infection at the transfusion site, such as redness, swelling, pain, bleeding, or pus.  How can you care for yourself at home?  To prevent infection at the transfusion site  ?? Wash the area daily with warm, soapy water, and pat it dry. Don't use hydrogen peroxide or alcohol, which can slow healing. You may cover the area with a gauze bandage if it weeps or rubs against clothing. Change the bandage every day.  ?? Keep the area clean and dry.  When should you call for help?  Call 911 anytime you think you may need emergency care. For example, call if:  ?? You have severe trouble breathing.  Call your doctor now or seek immediate medical care if:  ?? You have signs of an allergic reaction, such as hives, swelling, or a new rash.  ?? You have a fever.  ?? You feel weaker or more tired than usual.  ?? You have a yellow tint to your skin or the whites of your eyes.  ?? You have signs  of an infection at the transfusion site, such as redness, swelling, pain, bleeding, or pus.  Watch closely for changes in your health, and be sure to contact your doctor if you have any problems.  Follow-up care is a key part of your treatment and safety. Be sure to make and go to all appointments, and call your doctor if you are having problems. It's also a good idea to know your test results and keep a list of the medicines you take.  Where can you learn more?  Go to [facility] at [email or web address]  Select Patient Education under [employer]. Enter V588 in the search box to learn more about Learning About Blood Transfusions.  Current as of: April 16, 2019??????????????????????????????Content Version: 12.9  ?? 2006-2021 [employer].   Care instructions adapted under license by [facility]. If you have questions about a medical condition or this instruction, always ask your healthcare professional. [employer] disclaims any warranty or liability for your use of this information.

## 2020-02-29 NOTE — Unmapped (Signed)
Pt to clinic for labs today.  Platelets tranfusioned and increased from 6 to 15, notified APP, no addition unit of platelets needed today.  Hgb 6.3 today, pt is fatigued and pale, will administer x2 units of PRBC's today.  Pt completed both units and reports feeling much better.  Pt left ambulatory with spouse.

## 2020-03-01 ENCOUNTER — Ambulatory Visit
Admit: 2020-03-01 | Discharge: 2020-03-03 | Disposition: A | Discharge status: Home / Self Care | Payer: PRIVATE HEALTH INSURANCE

## 2020-03-01 LAB — PROTIME: Coagulation tissue factor induced:Time:Pt:PPP:Qn:Coag: 18.8 — ABNORMAL HIGH

## 2020-03-01 LAB — HEPATIC FUNCTION PANEL
ALBUMIN: 3.5 g/dL (ref 3.4–5.0)
ALKALINE PHOSPHATASE: 127 U/L — ABNORMAL HIGH (ref 46–116)
AST (SGOT): 45 U/L — ABNORMAL HIGH (ref <=34)
BILIRUBIN DIRECT: 0.3 mg/dL (ref 0.00–0.30)

## 2020-03-01 LAB — CALCIUM IONIZED VENOUS (MG/DL)
Calcium.ionized:MCnc:Pt:Bld:Qn:: 5.04
Calcium.ionized:MCnc:Pt:Bld:Qn:: 5.13

## 2020-03-01 LAB — APTT
APTT: 32.7 s (ref 24.9–36.9)
HEPARIN CORRELATION: 0.2

## 2020-03-01 LAB — HEPARIN CORRELATION
Lab: 0.2
Lab: 0.2

## 2020-03-01 LAB — MAGNESIUM: Magnesium:MCnc:Pt:Ser/Plas:Qn:: 1.5 — ABNORMAL LOW

## 2020-03-01 LAB — INR: Coagulation tissue factor induced.INR:RelTime:Pt:PPP:Qn:Coag: 1.61

## 2020-03-01 LAB — PHOSPHORUS
PHOSPHORUS: 3.6 mg/dL (ref 2.4–5.1)
Phosphate:MCnc:Pt:Ser/Plas:Qn:: 3.6
Phosphate:MCnc:Pt:Ser/Plas:Qn:: 3.6

## 2020-03-01 LAB — FREE T4: Thyroxine.free:MCnc:Pt:Ser/Plas:Qn:: 0.93

## 2020-03-01 LAB — LACTATE DEHYDROGENASE: Lactate dehydrogenase:CCnc:Pt:Ser/Plas:Qn:Reaction: pyruvate to lactate: 256 — ABNORMAL HIGH

## 2020-03-01 LAB — THYROID STIMULATING HORMONE: Thyrotropin:ACnc:Pt:Ser/Plas:Qn:: 0.431 — ABNORMAL LOW

## 2020-03-01 LAB — T3 FREE: Triiodothyronine.free:MCnc:Pt:Ser/Plas:Qn:: 2.79

## 2020-03-01 LAB — BILIRUBIN DIRECT: Bilirubin.glucuronidated+Bilirubin.albumin bound:MCnc:Pt:Ser/Plas:Qn:: 0.3

## 2020-03-01 MED ORDER — PREVYMIS 480 MG TABLET
ORAL_TABLET | Freq: Every day | ORAL | 0 refills | 30 days
Start: 2020-03-01 — End: 2020-03-01

## 2020-03-01 MED ORDER — LETERMOVIR 480 MG TABLET
ORAL_TABLET | Freq: Every day | ORAL | 3 refills | 28 days | Status: CP
Start: 2020-03-01 — End: ?

## 2020-03-01 MED ADMIN — cefepime (MAXIPIME) 2 g in dextrose 100 mL IVPB (premix): 2 g | INTRAVENOUS | @ 03:00:00 | Stop: 2020-02-29

## 2020-03-01 MED ADMIN — heparin, porcine (PF) 100 unit/mL injection 2 mL: 2 mL | INTRAVENOUS | @ 13:00:00

## 2020-03-01 MED ADMIN — tbo-filgrastim (GRANIX) injection 300 mcg: 300 ug | SUBCUTANEOUS | @ 16:00:00 | Stop: 2020-03-01

## 2020-03-01 MED ADMIN — levothyroxine (SYNTHROID) tablet 100 mcg: 100 ug | ORAL | @ 09:00:00

## 2020-03-01 MED ADMIN — valACYclovir (VALTREX) tablet 500 mg: 500 mg | ORAL | @ 13:00:00

## 2020-03-01 MED ADMIN — cefepime (MAXIPIME) 2 g in dextrose 100 mL IVPB (premix): 2 g | INTRAVENOUS | @ 17:00:00 | Stop: 2020-03-08

## 2020-03-01 MED ADMIN — ondansetron (ZOFRAN-ODT) disintegrating tablet 8 mg: 8 mg | ORAL | @ 16:00:00

## 2020-03-01 MED ADMIN — tacrolimus (PROGRAF) capsule 1.5 mg: 1.5 mg | ORAL | @ 13:00:00

## 2020-03-01 MED ADMIN — eltrombopag (PROMACTA) tablet 100 mg: 100 mg | ORAL | @ 16:00:00

## 2020-03-01 MED ADMIN — vancomycin (VANCOCIN) IVPB 1000 mg (premix): 1000 mg | INTRAVENOUS | @ 05:00:00 | Stop: 2020-03-01

## 2020-03-01 MED ADMIN — docusate sodium (COLACE) capsule 100 mg: 100 mg | ORAL | @ 15:00:00

## 2020-03-01 MED ADMIN — sodium chloride (NS) 0.9 % infusion: 20 mL/h | INTRAVENOUS | @ 05:00:00

## 2020-03-01 MED ADMIN — ondansetron (ZOFRAN-ODT) disintegrating tablet 8 mg: 8 mg | ORAL | @ 22:00:00

## 2020-03-01 NOTE — Unmapped (Signed)
Arrived from ED this AM. Oriented to room. Day + 47. Vitals signs: remained WNL this shift. Pain: denies. Nausea/vomitting: denies. Diarrhea: denies. Blood/electrolyte replacements: none given per order parameters. Other: Tolerated scheduled medications, no falls or injuries, neutropenic precautions maintained    Problem: Adult Inpatient Plan of Care  Goal: Plan of Care Review  Outcome: Ongoing - Unchanged  Goal: Patient-Specific Goal (Individualized)  Outcome: Ongoing - Unchanged  Goal: Absence of Hospital-Acquired Illness or Injury  Outcome: Ongoing - Unchanged  Intervention: Identify and Manage Fall Risk  Recent Flowsheet Documentation  Taken 03/01/2020 0200 by Ervin Knack, RN  Safety Interventions:   bleeding precautions   environmental modification   isolation precautions   lighting adjusted for tasks/safety   low bed   neutropenic precautions  Intervention: Prevent Infection  Recent Flowsheet Documentation  Taken 03/01/2020 0200 by Ervin Knack, RN  Infection Prevention:   environmental surveillance performed   equipment surfaces disinfected   personal protective equipment utilized   rest/sleep promoted   visitors restricted/screened   single patient room provided   hand hygiene promoted   cohorting utilized  Goal: Optimal Comfort and Wellbeing  Outcome: Ongoing - Unchanged  Goal: Readiness for Transition of Care  Outcome: Ongoing - Unchanged  Goal: Rounds/Family Conference  Outcome: Ongoing - Unchanged     Problem: Infection  Goal: Absence of Infection Signs and Symptoms  Outcome: Ongoing - Unchanged  Intervention: Prevent or Manage Infection  Recent Flowsheet Documentation  Taken 03/01/2020 0200 by Ervin Knack, RN  Infection Management: aseptic technique maintained  Isolation Precautions: protective precautions maintained

## 2020-03-01 NOTE — Unmapped (Addendum)
BMT CLINIC FOLLOW UP    Susan Kane [place]  59 y.o. 1961-05-14   BMT MD:  Susan Boast, MD    Referring Oncologist: [facility]) Susan Kane [phone]  Date: 03/01/20    Disease: CLL  Current disease status: SD (stable disease)  Type of Transplant: RIC MUD Allo  Graft Source: Cryopreserved PBSCs  Transplant Day: D+50 (03/01/20)     Study Participant: BMT CTN 1704: Composite Health Assessment Risk Model for Older Adults: Applying Pre-transplant Comorbidity, Geriatric Assessment and Biomarkers to Predict Non-Relapse Mortality After Allogeneic Transplant (CHARM).     Susan Kane is a 59 y.o. 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  She was discharged from hospital on 02/23/2020.  Please this noted for details regarding her admission.   She remains on eltrobopag and Granix.     Susan Kane was was seen in clinic on 02/27/20 with plan to have plt transfusion and f.u in infusion clinic on 02/29/20 for possible plts and blood.  No med changes were made during this visit.  The patient called into on call provider w/ c/o a fever of 100.3 by which she was asked to recheck in an hour.  Her repeat temp was 100.5 however, the patient had apparently eaten some hot rice so there was a questionable discrepancy.  She waited about7min then retook and her temp was 100.4 for which she was advised to come to ER for further work up.  The patient's daughter who is at bedside stated the temp was vacillating from 99-100.9 so she is not so sure that there isn't an issue with the thermometer despite it being new.  Pt stated she has had difficulty with nausea while taking meds thus she attempted to divide up some of her daily dosed meds and take some in AM and some in PM.  She stated she has been taking zofran scheduled and zyprexa as well.  Stated her taste is altered but has been consuming 3 small meals/day and drinks a variety of fluids totaling probably 1.5liters/day.  Denied myalgia's, chills, URI/UTI s/s, exposure to sick/covid contacts, diarrhea.      In the ER the pt's temp was 99.5, bp 107/70, pulse 91 and RA w/out s/s of hypoxia and saturation of 99%.  She was pan cultured, UA with small blood, rare bacteria and neg leuk's, 2 WBC's.  CXR w/out overt consolidation and clear.  Pt given a dose of vanc w/out indication clinically to continue and cefepime.      Family History  The patient has:      Siblings: half brother (has DM2)  Children: three children. Daughter 1 and twin sons 72 (all healthy)     Her daughter moved to [place] recently and is an Pensions consultant.  Her twin boys live together in [place].       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  Married to her current husband for the past 4 years.      ROS:  Comprehensive ROS negative except pertinent positives listed in interval history.     Oncology History Overview Note   CLL:    Summary as per Susan Kane most recent note with minor edits/additions from my  review of records    1. CLL??? diagnosed in August 2010, flow cytometry consistent with CLL  ?? Enlarged left inguinal lymph node January 2019, small neck/axillary nodes and palpable splenomegaly 09/11/2017  ?? CTs on 09/17/2017-3 cm necrotic appearing lymph node in the left inguinal region, borderline enlarged pelvic/retroperitoneal, chest, and axillary nodes.  Mild splenomegaly.  ?? Ultrasound-guided biopsy of the left inguinal lymph node 09/18/2017, slightly purulent fluid aspirated, core biopsy is consistent with an atypical lymphoid proliferation???extensive necrosis with surrounding epithelioid histiocytes, limited intact lymphoid tissue involved with CLL  ?? Incisional biopsy of a necrotic/purulent left inguinal lymph node on 10/01/2017???extensive necrosis with granulomatous inflammation, small amount of viable lymphoid tissue involved with CLL, AFB and fungal stains negative  ?? Peripheral blood FISH analysis 02/05/2018??? +deletion 13q14, no evidence of p53 (17p13) deletion, no evidence of 11q22 deletion  - normal karyotype (46,XX)  ?? Bone marrow biopsy 02/26/2018???hypercellular marrow with extensive involvement by CLL, lymphocytes represent 85% of all cells  ?? Ibrutinib initiated 04/03/2018  ?? Ibrutinib placed on hold 04/11/2018 due to onset of arthralgias  ?? Ibrutinib resumed 04/16/2018, discontinued 04/25/2018 secondary to severe arthralgias/arthritis  ?? Ibrutinib resumed at a dose of 140 mg daily 05/03/2018  ?? Ibrutinib dose adjusted to 140 mg alternating with 280 mg 06/25/2018  ?? Ibrutinib discontinued 07/03/2018 secondary to severe arthralgias  2. Hypothyroidism  3. 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  Left lung pneumonia diagnosed 10/08/2017???completed 7 days of Levaquin  5.  Left lung pneumonia on chest x-ray 12/27/2017.  Augmentin prescribed.  6.  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 DAT negative, bilirubin and LDH normal June 2019, progressive symptomatic anemia 04/01/2018, red cell transfusions 04/01/2018, 04/30/2018, 05/28/2018, 06/17/2018, and 07/05/2018  7.  Hypogammaglobulinemia    Baseline BM bx reviewed at [facility]  Final Diagnosis   (Outside Case #:  [id number], dated 02/26/2018)  Bone marrow, aspiration and biopsy  -  Hypercellular bone marrow (80%) with extensive involvement by chronic lymphocytic leukemia (87% lymphocytes by manual aspirate differential)  (See Comment)  -  By outside report, cytogenetic results are normal     Karyotype: 23, XX and FISH with 13q del but no 11q or 17p del    Repeat BM bx: (done after being off ibrutinib x1 month)  80% involvement by CLL    08/13/18: Presents to [facility] to discuss plan of care     Chronic lymphocytic leukemia (CLL), B-cell (CMS-HCC)   07/12/2018 Initial Diagnosis    Chronic lymphocytic leukemia (CLL), B-cell (CMS-HCC) 08/26/2018 -  Chemotherapy    Acalabrutinib started (approximate date)     11/14/2018 -  Chemotherapy    Acalabrutinib discontinued due to progressive bone marrow failure     12/06/2018 -  Chemotherapy    Ritux x1 given due to CLL still making up majority of BM cellularity (though in the setting of near aplasia of rest of TLH)     03/19/2019 -  Chemotherapy    Equine ATG / CsA for aplastic anemia (suspect 2/t acalabrutinib)     Chronic lymphocytic leukemia of B-cell type not having achieved remission (CMS-HCC)   01/21/2019 Initial Diagnosis    Chronic lymphocytic leukemia of B-cell type not having achieved remission (CMS-HCC)     01/01/2020 -  Chemotherapy    BMT IP BENDAMUSTINE / FLUDARABINE / RITUXIMAB (OP RITUXIMAB Days -13 & -6) (MUD)  bendamustine 130 mg/m2 IV on days -5, -4, -3   fludarabine 30 mg/m2 IV on days -5, -4, -3   riTUXimab  375 mg/m2 IV on day -13 as a OP treatment day, then 1,000 mg/m2 on days -6 as a OP treatment day, +1, +8   tacrolimus, 0.045 mg/kg PO BID on days -3 and -2 then  0.03 mg/kg PO BID starting on day -1   methotrexate 5 mg/m2 IV on days +1, +3, +6, +11   tbo-filgrastim 300 mcg (<= 75 kg) / 480 mcg (> 75 kg) q24h starting on day +7        Past Medical History:   Diagnosis Date   ??? Anxiety    ??? Depression    ??? Hypothyroid      Past Surgical History:   Procedure Laterality Date   ??? BONE MARROW BIOPSY & ASPIRATION  02/10/2020        ??? CESAREAN SECTION         Current Facility-Administered Medications:   ???  aluminum-magnesium hydroxide-simethicone (MAALOX MAX) 80-80-8 mg/mL oral suspension, 30 mL, Oral, Q4H PRN, Dillard Essex, FNP  ???  CETAPHIL topical cleanser 1 application, 1 application, Topical, 4x Daily PRN, Dillard Essex, FNP  ???  eltrombopag (PROMACTA) tablet 100 mg, 100 mg, Oral, Daily, Dillard Essex, FNP  ???  emollient combination no.92 (LUBRIDERM) lotion 1 application, 1 application, Topical, 4x Daily PRN, Dillard Essex, FNP  ???  famotidine (PEPCID) tablet 20 mg, 20 mg, Oral, BID, Dillard Essex, FNP  ???  folic acid (FOLVITE) tablet 1 mg, 1 mg, Oral, At bedtime, Dillard Essex, FNP  ???  heparin, porcine (PF) 100 unit/mL injection 2 mL, 2 mL, Intravenous, Q MWF, Dillard Essex, FNP  ???  levothyroxine (SYNTHROID) tablet 100 mcg, 100 mcg, Oral, Daily, Dillard Essex, FNP  ???  lithium (LITHOBID) ER tablet 300 mg, 300 mg, Oral, At bedtime, Dillard Essex, FNP  ???  OLANZapine zydis (ZyPREXA) disintegrating tablet 5 mg, 5 mg, Oral, Nightly, Dillard Essex, FNP  ???  ondansetron [facility]) injection 4 mg, 4 mg, Intravenous, Once, Marveen Reeks, MD  ???  ondansetron (ZOFRAN-ODT) disintegrating tablet 8 mg, 8 mg, Oral, Q8H PRN, Dillard Essex, FNP  ???  posaconazole (NOXAFIL) delayed released tablet 300 mg, 300 mg, Oral, Daily, France Ravens [place]of[place], FNP  ???  sodium chloride (NS) 0.9 % infusion, 20 mL/hr, Intravenous, Continuous, Dillard Essex, FNP, Last Rate: 20 mL/hr at 03/01/20 0124, 20 mL/hr at 03/01/20 0124  ???  tacrolimus (PROGRAF) capsule 1.5 mg, 1.5 mg, Oral, BID, Dillard Essex, FNP  ???  valACYclovir (VALTREX) tablet 500 mg, 500 mg, Oral, Daily, Dillard Essex, FNP  ???  vancomycin (VANCOCIN) IVPB 1000 mg (premix), 1,000 mg, Intravenous, Once, Marveen Reeks, MD, 1,000 mg at 03/01/20 0125  ???  zolpidem (AMBIEN) tablet 10 mg, 10 mg, Oral, Nightly PRN, Dillard Essex, FNP    Current Outpatient Medications:   ???  cefdinir (OMNICEF) 300 MG capsule, Take 1 capsule (300 mg total) by mouth Two (2) times a day., Disp: 30 capsule, Rfl: 0  ???  eltrombopag (PROMACTA) 50 MG tablet, Take 2 tablets (100 mg total) by mouth daily. Administer on an empty stomach, 1 hour before or 2 hours after a meal., Disp: 60 tablet, Rfl: 2  ???  famotidine (PEPCID) 20 MG tablet, Take 1 tablet (20 mg total) by mouth Two (2) times a day., Disp: 60 tablet, Rfl: 0  ???  folic acid (FOLVITE) 1 MG tablet, Take 1 tablet (1 mg total) by mouth at bedtime., Disp: 30 tablet, Rfl: 5  ???  levothyroxine (SYNTHROID) 100 MCG tablet, Take 1 tablet (100 mcg total) by mouth daily., Disp: 30 tablet, Rfl: 5  ???  lithium (LITHOBID) 300 MG ER tablet, Take 1 tablet (300 mg total) by mouth at bedtime., Disp: 30 tablet, Rfl: 5  ???  magnesium oxide-Mg AA chelate (MAGNESIUM, AMINO ACID CHELATE,) 133 mg Tab, Take 1 tablet by mouth Two (2) times a day., Disp: 100 tablet, Rfl: 2  ???  OLANZapine zydis (ZYPREXA) 5 MG disintegrating tablet, Take 1 tablet (5 mg total) by mouth nightly., Disp: 30 tablet, Rfl: 2  ???  ondansetron (ZOFRAN-ODT) 8 MG disintegrating tablet, Dissolve  1 tablet (8 mg total) by mouth every eight (8) hours as needed for nausea., Disp: 30 tablet, Rfl: 2  ???  posaconazole (NOXAFIL) 100 mg TbEC delayed released tablet, Take 3 tablets (300 mg total) by mouth daily., Disp: 90 tablet, Rfl: 1  ???  tacrolimus (PROGRAF) 0.5 MG capsule, Take 3 capsules (1.5 mg total) by mouth two (2) times a day., Disp: 180 capsule, Rfl: 5  ???  TRINTELLIX 10 mg tablet, Take 1 tablet (10 mg total) by mouth at bedtime., Disp: 30 tablet, Rfl: 5  ???  valACYclovir (VALTREX) 500 MG tablet, Take 1 tablet (500 mg total) by mouth daily., Disp: 30 tablet, Rfl: 11  ???  zolpidem (AMBIEN) 10 mg tablet, Take 1 tablet (10 mg total) by mouth nightly as needed for sleep., Disp: 20 tablet, Rfl: 0    Physical exam:  69, Cares for self; unable to carry on normal activity or to do active work.  BP 108/62  - Pulse 81  - Temp 36.8 ??C (98.2 ??F) (Oral)  - Resp 16  - Ht 165.1 cm (5' 5)  - Wt 56 kg (123 lb 6.4 oz)  - SpO2 95%  - BMI 20.53 kg/m??   General Appearance:   No acute distress, looks comfortable.   HEENT:                        Dentition No visible caries, gum disease, or infection. Oral mucosa is pink and moist.  Lungs:                Clear to auscultation bilaterally but diminished in bases bilaterally  Heart:                           Regular rate and rhythm w/out murmurs, clicks, s3 or s4.  s1 & s2 noted  Abdomen: Soft, non-tender, non-distended. No organomegaly.   Extremities:              Warm and well perfused.  Lymphatics:   No palpable LAD.  Neurological:               Nonfocal and grossly intact.   Musculoskeletal:        Moves all extremities w/out difficulty.  No deformities or pain on exam    Lab Results   Component Value Date    WBC 0.8 (L) 02/29/2020    HGB 8.2 (L) 02/29/2020    HCT 21.8 (L) 02/29/2020    PLT 16 (L) 02/29/2020       Lab Results   Component Value Date    NA 139 02/29/2020    K 4.1 02/29/2020    CL 107 02/29/2020    CO2 25.0 02/29/2020    BUN 16 02/29/2020    CREATININE 0.81 (H) 02/29/2020  GLU 169 02/29/2020    CALCIUM 9.0 02/29/2020    MG 1.5 (L) 02/27/2020    PHOS 3.9 02/23/2020       Lab Results   Component Value Date    BILITOT 0.8 02/29/2020    BILIDIR 0.20 02/23/2020    PROT 5.9 02/29/2020    ALBUMIN 3.4 02/29/2020    ALT 79 (H) 02/29/2020    AST 53 (H) 02/29/2020    ALKPHOS 125 (H) 02/29/2020    GGT 22 01/09/2020       Lab Results   Component Value Date    INR 1.51 02/23/2020    APTT 46.4 (H) 02/23/2020     Assessment and Plan:  Susan Kane is 59 yo woman with CLL as well as AA/bone marrow failure. She failed Rituxan and ATG/CSA and then tacrolimus. Her blood counts improve somewhat in response to tacrolimus but only that is far from normal and far from being able to sustain future treatment for CLL. While the treatment modalities for CLL have not been exhausted, patient's marrow reserve could not support further CLL therapy.     CLL: The CT of C/A/P on 10/10/19 revealed no evidence of LAD or organomegaly. There was possible endometrial fluid or thickening in the fundus of the uterus.  The BMBx on 10/07/19 showed:   Scant bone marrow sampling (<5% cellular) essentially devoid of hematopoietic elements.  -  Persistent chronic lymphocytic leukemia, representing 23% of marrow cellularity by flow cytometric analysis       -  Routine cytogenetic analysis is pending.   - Corresponding CBC: WBC 1.0, ANC 0.1.     12/26/19: BMBx:   -  Hypocellular bone marrow (20% overall) involved by chronic lymphocytic leukemia/small lymphocytic lymphoma, representing approximately 20% of marrow cells by PAX5 immunohistochemistry  - Normal karyotype: 46,XX[20]    BMT: SD (stable disease)  HCT-CI (age adjusted) 5 (active CLL, psych, age adjusted)  Conditioning:  1. Rituximab 375 mg/m2 on day -13 and 1000 mg/m2 on D-6, +1, and +8  2. Bendamustine 130 mg/m2 IV daily over 10 minutes on D-5 through -3  3. Fludarabine 30 mg/m2 IV daily over 30 minutes on D-5 through -3    Donor: 10/10, ABO ), CMV negative  Engraftment: Granix starting D+12 through neutrophil recovery (as defined as ANC 1.0 x 2 days or 3.0 x 1 day)  - Viral studies showed weakly positive EBV, CMV, HHV-6, plan to monitor for now  - BMBx results showed hypocellular marrow without evidence of hematopiesis. Chimerism studies are still pending  GvHD prophylaxis:   1. Tacrolimus 0.045 mg/kg PO BID starting D-3 followed by 0.03 mg/kg PO BID starting on D-1 with a goal of 5-10 ng/mL   ** Current dose 1/0.5mg  BID  2. Methotrexate 5 mg/m2 on D+1, +3, +6 and +11  3. rATG 1 mg/kg on D-2 and -1 included    Heme: On Granix. Pt s/p 1unit plts 02/29/2020 for a plt count of 6 that increased to 15.  Hgb 6.4 for which she received 2units PRBC's on 02/29/2020 w/ improvement up to 8.2.    - Transfusion criteria: Transfuse 1 unit of PRBCs for Hgb<7 and 1 unit of platelets for Plt <10K or bleeding. No history of transfusion reactions.     ID:   Neutropenic fever  Fever at home 100.4 and low grade in ER of 99.5.  Continue cefepime, no clinical indication to cont vanc at this time.  No s/s of sepsis.  CXR/Urinalysis clear, await blood/urine  cx's.      Prophylaxis:  - Antiviral: Continue Valtrex 500 mg po daily on admit  - Antifungal: Posaconazole 300 mg daily  - Antibacterial: Holding outpatient Cefdinir while IP on cefepime starting on 02/29/2020   Will use cefdinir 300 BID for prophy. May be able to discontinue once ANC reaches 1,000 consistently.   ** Hx of nausea with levofloxacin when given with CNI and higher dose of Trintellix, so unclear which agent caused nausea. Patient ok with re-challenging with levofloxacin with plan to switch to cefdinir if she develops significant nausea while on levofloxacin  - PJP: Bactrim DS once daily MWF upon platelet engraftment.  S/p pentamidine on 02/21/20.      CMV:  - 02/24/20: CMV 207    HHV-6:  - Positive PCR on 7/15 with 40.6 cycles  - Repeat PCR showed increased cycles at 41.2    EBV: negative    GI: Pepcid for GERD prophylaxis    CINV:  - Anti-emetics per BMT protocol.   - Zofran works well for her, re-ordered home ODT q 8hr scheduled  - Nightly zyprexa re-ordered    Diarrhea: Resolved.   Constipation: Miralax PRN and would add scheduled colace 100mg  bid should she begin to have issues with constipation but no c/o this at this time    Renal:   - Cr 0.8  baseline of ~ 0.7    FEN:  Electrolyte replacement per protocol  - Continue folic acid.  - Received some IVF's as flushes after antbx but no other.  Appears euvolemic on exam    Endocrine:  Hypothyroid:   - Continue Synthroid daily.   - Check TSH, T3, T4    Hepatic:   - VOD prophylaxis not warranted as non-myeloablative regimen.      Hepatocellular liver injury pattern:  - Mild increase in AST, ALT, and AlkPhos, likely secondary to posaconazole  - Follow up repeat viral studies on Monday per primary team.    CV:  HTN: currently not on treatment.    Pulm:  AHRF: First noted on 7/22. Intermittent O2 requirement with exam findings concerning for LLL crackles. No signs of volume overload on exam.  - Chest-X ray clear  - RA currently with saturation >92%    Neuro/Pain:   - No c/o at this time    Psych: Has a home psychiatrist.   Depression/Anxiety:   - Per outpt note pt was taking Xanax 0.25 mg BID PRN for anxiety but this was not on her home med list thus I did not re-order at this time. Discussed with patient who stated she rarely takes.  - Continue Lithium 300mg  nightly  - Trinellix 10mg  nightly however, not on formulary thus will defer to day team to approve pt using home supply.   - Psych consult PRN.     Insomnia:  - Ambien 10mg  nightly ordered    Caregiver/Lodging:  - Husband [name] will be primary caregiver  - Plan to stay in local rental post transplant       Warner Mccreedy FNP-BC  [facility] Marrow Transplant [facility] Cellular Therapy [facility]    I personally spent 65 minutes face-to-face and non-face-to-face in the care of this patient, which includes all pre, intra, and post visit time on the date of service.    Attending Attestation:  ??  It was medically necessary for me to see the patient in addition to the APP because Susan Kane is a complex oncology patient s/p high dose chemotherapy and  allogeneic stem cell transplantation at risk for complications including nausea, vomiting, diarrhea, graft versus host disease, renal insufficiency, hypertension, hypotension, infection, and sepsis.  My visit included a history, physical exam, and review of pertinent test results.  ??  Susan Kane is admitted for neutropenic fever.  No obvious localizing signs or symptoms.  We will plan to cover her empirically with single agent cefepime.  We will repeat CMV and HHV6 testing as these have been low positive in clinic.  ??  Genene Churn. Sheryn Bison, MD

## 2020-03-01 NOTE — Unmapped (Signed)
Care Management  Initial Transition Planning Assessment              General  Care Manager assessed the patient by : In person interview with patient, Discussion with Clinical Care team, Medical record review  Orientation Level: Oriented X4  Functional level prior to admission: Independent  Reason for referral: Discharge Planning    Contact/Decision Maker  Extended Emergency Contact Information  Primary Emergency Contact: Alean Rinne  Address: [address]           [place], [place] [address] [place] Phone: [phone]  Relation: Spouse  Secondary Emergency Contact: Bailey, Faiella  Mobile Phone: [phone]  Relation: Daughter  Preferred language: ENGLISH  Interpreter needed? No    Legal Next of Kin / Guardian / POA / Advance Directives       Advance Directive (Medical Treatment)  Does patient have an advance directive covering medical treatment?: Patient has advance directive covering medical treatment, copy in chart.    Health Care Decision Maker [HCDM] (Medical & Mental Health Treatment)  Healthcare Decision Maker: HCDM documented in the HCDM/Contact Info section.  Information offered on HCDM, Medical & Mental Health advance directives:: Patient given information.    Advance Directive (Mental Health Treatment)  Does patient have an advance directive covering mental health treatment?: Patient does not have advance directive covering mental health treatment.  Reason patient does not have an advance directive covering mental health treatment:: Patient does not wish to complete one at this time.    Patient Information  Lives with: Spouse/significant other    Type of Residence: Private residence        Location/Detail: Currently staying locally at [address] [place] [address]   Husband and daughter are caregivers    Type of Residence: Mailing Address:  [address]  [place] [address]  Contacts:    Patient Phone Number:   Telephone Information: Mobile [phone]             Medical Provider(s): Thora Lance, MD  Reason for Admission: Admitting Diagnosis:  Neutropenic fever (CMS-HCC) [D70.9, R50.81]  Fever, unspecified fever cause [R50.9]  Past Medical History:   has a past medical history of Anxiety, Depression, and Hypothyroid.  Past Surgical History:   has a past surgical history that includes Cesarean section and Bone Marrow Biopsy & Aspiration (02/10/2020).   Previous admit date: 01/09/2020    Primary Insurance- Payor: BCBS / Plan: BCBS BLUE OPTIONS/PPO/ADV ([place] ONLY) / Product Type: *No Product type* /   Secondary Insurance ??? None  Prescription Coverage ??? BCBS  Preferred [facility]acy - [phone] [place], [place] - [address] AT [address] [facility] [address]  [facility]  [facility]    Transportation home: Private vehicle          Support Systems/Concerns: Children, Spouse, Family Members, Friends/Neighbors    Responsibilities/Dependents at home?: No    Home Care services in place prior to admission?: Yes  Type of Home Care services in place prior to admission: Home infusion  Current Home Care provider (Name/Phone #): [facility] for line care supplies only            Equipment Currently Used at Home: none       Currently receiving outpatient dialysis?: No       Financial Information       Need for financial assistance?: No  Social Determinants of Health  Social Determinants of Health     Tobacco Use: Low Risk    ??? Smoking Tobacco Use: Never Smoker   ??? Smokeless Tobacco Use: Never Used   Alcohol Use:    ??? How often do you have a drink containing alcohol?:    ??? How many drinks containing alcohol do you have on a typical day when you are drinking?:    ??? How often do you have 5 or more drinks on one occasion?:    Financial Resource Strain: Low Risk    ??? Difficulty of Paying Living Expenses: Not hard at all   Food Insecurity: No Food Insecurity   ??? Worried About Programme researcher, broadcasting/film/video in the Last Year: Never true   ??? Ran Out of Food in the Last Year: Never true   Transportation Needs: No Transportation Needs   ??? Lack of Transportation (Medical): No   ??? Lack of Transportation (Non-Medical): No   Physical Activity:    ??? Days of Exercise per Week:    ??? Minutes of Exercise per Session:    Stress:    ??? Feeling of Stress :    Social Connections:    ??? Frequency of Communication with Friends and Family:    ??? Frequency of Social Gatherings with Friends and Family:    ??? Attends Religious Services:    ??? Database administrator or Organizations:    ??? Attends Engineer, structural:    ??? Marital Status:    Intimate Programme researcher, broadcasting/film/video Violence:    ??? Fear of Current or Ex-Partner:    ??? Emotionally Abused:    ??? Physically Abused:    ??? Sexually Abused:    Depression:    ??? PHQ-2 Score:    Housing Stability: Low Risk    ??? Within the past 12 months, have you ever stayed: outside, in a car, in a tent, in an overnight shelter, or temporarily in someone else's home (i.e. couch-surfing)?: No   ??? Are you worried about losing your housing?: No   ??? Within the past 12 months, have you been unable to get utilities (heat, electricity) when it was really needed?: No   Substance Use:    ??? Taken prescription drugs for non-medical reasons:    ??? Taken illegal drugs:    ??? Patient indicated they have taken drugs in the past year, including Cannabis, Cocaine, Prescription stimulants, Methamphetamine, Inahalnts, Sedatives or sleeping pills, Hallucinogens, Street Opioids or Prescription opiods for non-medical reasons:    Health Literacy:    ??? :        Discharge Needs Assessment  Concerns to be Addressed: denies needs/concerns at this time, no discharge needs identified    Clinical Risk Factors: Principal Diagnosis: Cancer, Stroke, COPD, Heart Failure, AMI, Pneumonia, Joint Replacment    Barriers to taking medications: No    Prior overnight hospital stay or ED visit in last 90 days: Yes    Readmission Within the Last 30 Days: current reason for admission unrelated to previous admission (readmit for fever post transplant)         Anticipated Changes Related to Illness: none    Equipment Needed After Discharge: none    Discharge Facility/Level of Care Needs: other (see comments) (return to local house rental)    Readmission  Risk of Unplanned Readmission Score: UNPLANNED READMISSION SCORE: 23%  Predictive Model Details          23% (High)  Factor Value  Calculated 03/01/2020 12:04 22% Number of active Rx orders 36     Risk of Unplanned Readmission Model 9% Diagnosis of cancer present     9% Number of hospitalizations in last year 2     8% Active antipsychotic Rx order present     8% ECG/EKG order present in last 6 months     7% Encounter of ten days or longer in last year present     6% Imaging order present in last 6 months     5% Latest hemoglobin low (8.2 g/dL)     5% Phosphorous result present     5% Number of ED visits in last six months 1     4% Age 59     4% Active anticoagulant Rx order present     4% Latest creatinine high (0.81 mg/dL)     2% Charlson Comorbidity Index 2     2% Future appointment scheduled     1% Active ulcer medication Rx order present     0% Current length of stay 0.465 days      Readmitted Within the Last 30 Days? (No if blank) Yes  Patient at risk for readmission?: Yes    Discharge Plan  Screen findings are: Discharge planning needs identified or anticipated (Comment).    Expected Discharge Date: 03/03/2020      Quality data for continuing care services shared with patient and/or representative?: N/A  Patient and/or family were provided with choice of facilities / services that are available and appropriate to meet post hospital care needs?: Yes   List choices in order highest to lowest preferred, if applicable. : [facility] with [facility], she is happy with services    Initial Assessment complete?: Yes

## 2020-03-01 NOTE — Unmapped (Signed)
[facility] Emergency Department Provider Note      ED Course, Assessment and Plan     Initial Clinical Impression:    February 29, 2020 10:45 PM   Susan Kane is a 59 y.o. female with a history of CLL and aplastic anemia status post stem cell transplant who presents with febrile neutropenia.  She began feeling flushed approximately 2 hours prior to arrival as described below. On exam, Vital signs stable.  Overall chronically ill-appearing.  Normal cardiopulmonary exam.  Normal abdominal exam.  Normal neurologic exam.  Exam remarkable for right chest wall port without any evidence surrounding infection.    BP 107/79  - Pulse 91  - Temp 37.5 ??C (99.5 ??F)  - Resp 16  - Ht 165.1 cm (5' 5)  - Wt 56 kg (123 lb 6.4 oz)  - SpO2 99%  - BMI 20.53 kg/m??     Patient has fever of unknown source and is neutropenic.  Her hematology physician has recommended a full infectious work-up as well as empiric treatment cefepime.  We will also add on vancomycin given that she has the possibility of a line infection.  We will draw blood cultures of both lines and do full infectious work-up.  We will plan for admission to hematology/oncology unit.    ED Course:  Lab work unrevealing.  Admitted to med C for further management of neutropenic fever.  We will continue to monitor while patient is present in emergency department.          _____________________________________________________________________    The case was discussed with attending physician who is in agreement with the above assessment and plan    Dictation software was used while making this note. Please excuse any errors made with dictation software.    Additional Medical Decision Making     I have reviewed the vital signs and the nursing notes. Labs and radiology results that were available during my care of the patient were independently reviewed by me and considered in my medical decision making.     I independently visualized the EKG tracing if performed  I independently visualized the radiology images if performed  I reviewed the patient's prior medical records if available.  Additional history obtained from family if available    History     CHIEF COMPLAINT:   Chief Complaint   Patient presents with    Fever Between 49 Weeks and 60 Years Old       HPI: Susan Kane is a 58 y.o. female with a history of CLL and aplastic anemia status post stem cell transplant who presents with febrile neutropenia.  She began feeling flushed approximately 2 hours prior to arrival.  She checked a temp at home every 30 minutes and had a temp as high as 100.5.  She spoke with the hematology fellow who recommended she come in for neutropenic fever work-up and treatment.  She has had no symptoms other than one episode of feeling flushed.  No nausea, no vomiting, no abdominal pain, no cough, no Covid exposures, no dysuria, no difficulty breathing.  Of note, she did receive hemoglobin and partial platelet transfusions this morning.  She is had no rashes.    PAST MEDICAL HISTORY/PAST SURGICAL HISTORY:   Past Medical History:   Diagnosis Date    Anxiety     Depression     Hypothyroid        Past Surgical History:   Procedure Laterality Date    BONE MARROW  BIOPSY & ASPIRATION  02/10/2020         CESAREAN SECTION         MEDICATIONS:     Current Facility-Administered Medications:     cefepime (MAXIPIME) 2 g in dextrose 100 mL IVPB (premix), 2 g, Intravenous, Once, Marveen Reeks, MD    vancomycin (VANCOCIN) IVPB 1000 mg (premix), 1,000 mg, Intravenous, Once, Marveen Reeks, MD    Current Outpatient Medications:     cefdinir (OMNICEF) 300 MG capsule, Take 1 capsule (300 mg total) by mouth Two (2) times a day., Disp: 30 capsule, Rfl: 0    eltrombopag (PROMACTA) 50 MG tablet, Take 2 tablets (100 mg total) by mouth daily. Administer on an empty stomach, 1 hour before or 2 hours after a meal., Disp: 60 tablet, Rfl: 2    famotidine (PEPCID) 20 MG tablet, Take 1 tablet (20 mg total) by mouth Two (2) times a day., Disp: 60 tablet, Rfl: 0    folic acid (FOLVITE) 1 MG tablet, Take 1 tablet (1 mg total) by mouth at bedtime., Disp: 30 tablet, Rfl: 5    levothyroxine (SYNTHROID) 100 MCG tablet, Take 1 tablet (100 mcg total) by mouth daily., Disp: 30 tablet, Rfl: 5    lithium (LITHOBID) 300 MG ER tablet, Take 1 tablet (300 mg total) by mouth at bedtime., Disp: 30 tablet, Rfl: 5    magnesium oxide-Mg AA chelate (MAGNESIUM, AMINO ACID CHELATE,) 133 mg Tab, Take 1 tablet by mouth Two (2) times a day., Disp: 100 tablet, Rfl: 2    OLANZapine zydis (ZYPREXA) 5 MG disintegrating tablet, Take 1 tablet (5 mg total) by mouth nightly., Disp: 30 tablet, Rfl: 2    ondansetron (ZOFRAN-ODT) 8 MG disintegrating tablet, Dissolve  1 tablet (8 mg total) by mouth every eight (8) hours as needed for nausea., Disp: 30 tablet, Rfl: 2    posaconazole (NOXAFIL) 100 mg TbEC delayed released tablet, Take 3 tablets (300 mg total) by mouth daily., Disp: 90 tablet, Rfl: 1    tacrolimus (PROGRAF) 0.5 MG capsule, Take 3 capsules (1.5 mg total) by mouth two (2) times a day., Disp: 180 capsule, Rfl: 5    TRINTELLIX 10 mg tablet, Take 1 tablet (10 mg total) by mouth at bedtime., Disp: 30 tablet, Rfl: 5    valACYclovir (VALTREX) 500 MG tablet, Take 1 tablet (500 mg total) by mouth daily., Disp: 30 tablet, Rfl: 11    zolpidem (AMBIEN) 10 mg tablet, Take 1 tablet (10 mg total) by mouth nightly as needed for sleep., Disp: 20 tablet, Rfl: 0    Facility-Administered Medications Ordered in Other Encounters:     heparin, porcine (PF) 100 unit/mL injection 200 Units, 200 Units, Intravenous, Q30 Min PRN, Pernell Dupre, MD, 200 Units at 02/29/20 1242    ALLERGIES:   Patient has no known allergies.    SOCIAL HISTORY:   Social History     Tobacco Use    Smoking status: Never Smoker    Smokeless tobacco: Never Used   Substance Use Topics    Alcohol use: Not Currently     Alcohol/week: 5.0 standard drinks     Types: 5 Glasses of wine per week     Comment: stopped completely in August        FAMILY HISTORY:  Family History   Problem Relation Age of Onset    Breast cancer Mother 50        died in 16s of another cause    Interstitial cystitis Mother  Prostate cancer Father           Review of Systems    A 10 point review of systems was performed and is negative other than positive elements noted in HPI   Constitutional: Negative for fever.  Eyes: Negative for visual changes.  ENT: Negative for sore throat.  Cardiovascular: No chest pain.  Respiratory: Negative for shortness of breath.  Gastrointestinal: Negative for abdominal pain, vomiting or diarrhea.  Genitourinary: Negative for dysuria.  Musculoskeletal: Negative for back pain.  Skin: Negative for rash.  Neurological: Negative for headaches, focal weakness or numbness.    Physical Exam     VITAL SIGNS:    BP 107/79  - Pulse 91  - Temp 37.5 ??C (99.5 ??F)  - Resp 16  - Ht 165.1 cm (5' 5)  - Wt 56 kg (123 lb 6.4 oz)  - SpO2 99%  - BMI 20.53 kg/m??     Constitutional: Alert and oriented. W chronically applying and in no distress.  Eyes: Conjunctivae are normal.  ENT       Head: Normocephalic and atraumatic.       Nose: No congestion.       Mouth/Throat: Mucous membranes are moist.       Neck: No stridor.  Cardiovascular: Normal rate, regular rhythm. 2+ radial pulses equal bilaterally. <2 second cap refill.  Right chest wall port without any evidence of surrounding infection  Respiratory: Normal respiratory effort. Breath sounds are normal.  Gastrointestinal: Soft and nontender.   Genitourinary: No suprapubic tenderness  Musculoskeletal: Normal range of motion in all extremities.   Neurologic: Normal speech and language. No gross focal neurologic deficits are appreciated.  Skin: Skin is warm, dry. No rash noted.  Psychiatric: Mood and affect are normal. Speech and behavior are normal.         Radiology     XR Chest Portable    (Results Pending)     No results found.        Pertinent labs & imaging results that were available during my care of the patient were reviewed by me and considered in my medical decision making (see chart for details).    Please note- This chart has been created using [employer]. Chart creation errors have been sought, but may not always be located and such creation errors, especially pronoun confusion, do NOT reflect on the standard of medical care.     Marveen Reeks, MD  Resident  03/01/20 [phone]

## 2020-03-01 NOTE — Unmapped (Signed)
Tacrolimus Therapeutic Monitoring Pharmacy Note    Susan Kane is a 59 y.o. female continuing tacrolimus.     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 Monitor renal function (SCr and urine output) and liver function (LFTs)  ?? Monitor for signs/symptoms of adverse events (e.g., hyperglycemia, hyperkalemia, hypomagnesemia, hypertension, headache, tremor)    Results:   Tacrolimus level: 6.2 ng/mL, drawn 02/27/20    Pharmacokinetic Considerations and Significant Drug Interactions:  ??? Concurrent hepatotoxic medications: None identified  ??? Concurrent CYP3A4 substrates/inhibitors: None identified  ??? Concurrent nephrotoxic medications: None identified    Assessment/Plan:  Recommendedation(s)  ??? Continue current regimen of 1.5mg  BID    Follow-up  ??? Next level to be determined by primary team.   ??? A pharmacist will continue to monitor and recommend levels as appropriate    Please page service pharmacist with questions/clarifications.    [place] K Cecille Mcclusky, PharmD

## 2020-03-01 NOTE — Unmapped (Signed)
BMT CLINIC FOLLOW UP    Susan Kane [place]  59 y.o. 1961-02-07   BMT MD:  Lanae Boast, MD    Referring Oncologist: Dr. Jillyn Hidden [facility]) [name] [phone]  Date: 03/01/20    Disease: CLL  Current disease status: SD (stable disease)  Type of Transplant:??RIC MUD Allo  Graft Source: Cryopreserved PBSCs  Transplant Day:??D+50??(03/01/20)  ??  Study Participant: BMT CTN 1704: Composite Health Assessment Risk Model for Older Adults: Applying Pre-transplant Comorbidity, Geriatric Assessment and Biomarkers to Predict Non-Relapse Mortality After Allogeneic Transplant (CHARM).  ??  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  She was discharged from hospital on 02/23/2020.  Please this note for details regarding her admission.   She remains on eltrobopag and Granix.     Susan Kane was was seen in clinic on 02/27/20 with plan to have plt transfusion and f.u in infusion clinic on 02/29/20 for possible plts and blood.  No med changes were made during this visit.  The patient called into on call provider w/ c/o a fever of 100.3 by which she was asked to recheck in an hour.  Her repeat temp was 100.5 however, the patient had apparently eaten some hot rice so there was a questionable discrepancy.  She waited about19min then retook and her temp was 100.4 for which she was advised to come to ER for further work up.  The patient's daughter who is at bedside stated the temp was vacillating from 99-100.9 so she is not so sure that there isn't an issue with the thermometer despite it being new.  Pt stated she has had difficulty with nausea while taking meds thus she attempted to divide up some of her daily dosed meds and take some in AM and some in PM.  She stated she has been taking zofran scheduled and zyprexa as well.  Stated her taste is altered but has been consuming 3 small meals/day and drinks a variety of fluids totaling probably 1.5liters/day.  Denied myalgia's, chills, URI/UTI s/s, exposure to sick/covid contacts, diarrhea.      In the ER the pt's temp was 99.5, bp 107/70, pulse 91 and RA w/out s/s of hypoxia and saturation of 99%.  She was pan cultured, UA with small blood, rare bacteria and neg leuk's, 2 WBC's.  CXR w/out overt consolidation and clear.  Pt given a dose of vanc w/out indication clinically to continue and cefepime.      Interval History: Today Susan Kane reports feeling well overall, though somewhat fatigued. No further fevers, no growth noted yet on blood and urine cultures. Continues on cefepime. Granix administered for ANC 0.5. 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     Family History  The patient has:      Siblings: half brother (has DM2)  Children: three children. Daughter 63 and twin sons 56 (all healthy)  ??Her daughter moved to [place] recently and is an Pensions consultant.  Her twin boys live together in [place].   ??  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  Married to her current husband for the past 4 years.   ??  ROS:  Comprehensive ROS negative except pertinent positives listed in interval history.     Oncology History Overview Note   CLL:    Summary as per Dr. Kalman Drape most recent note with minor edits/additions from my review of records    1.??CLL?????diagnosed in August 2010, flow cytometry consistent with CLL  ?? Enlarged left??inguinal lymph node January 2019,??small??neck/axillary nodes and palpable splenomegaly 09/11/2017  ?? CTs??on 09/17/2017-3 cm necrotic appearing lymph node in the left inguinal region, borderline enlarged pelvic/retroperitoneal, chest, and axillary nodes. ??Mild splenomegaly.  ?? Ultrasound-guided biopsy of the left inguinal lymph node 09/18/2017, slightly purulent fluid aspirated, core biopsy is consistent with an atypical lymphoid proliferation???extensive necrosis with surrounding epithelioid histiocytes, limited intact lymphoid tissue involved with CLL  ?? Incisional biopsy of a necrotic/purulent left inguinal lymph node on 10/01/2017???extensive necrosis with granulomatous inflammation, small amount of viable lymphoid tissue involved with CLL, AFB and fungal stains negative  ?? Peripheral blood FISH analysis 02/05/2018??? +deletion 13q14, no evidence of p53 (17p13) deletion, no evidence of 11q22??deletion  - normal karyotype (46,XX)  ?? Bone marrow biopsy 02/26/2018???hypercellular marrow with extensive involvement by CLL, lymphocytes represent??85% of all cells  ?? Ibrutinib initiated 04/03/2018  ?? Ibrutinib placed on hold 04/11/2018 due to onset of arthralgias  ?? Ibrutinib resumed 04/16/2018, discontinued 04/25/2018 secondary to severe arthralgias/arthritis  ?? Ibrutinib resumed at a dose of 140 mg daily 05/03/2018  ?? Ibrutinib dose adjusted to 140 mg alternating with 280 mg 06/25/2018  ?? 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 ??Augmentin prescribed.  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    Baseline BM bx reviewed at [facility]  Final Diagnosis   (Outside Case #:  [id number], dated 02/26/2018)  Bone marrow, aspiration and biopsy  -  Hypercellular bone marrow (80%) with extensive involvement by chronic lymphocytic leukemia (87% lymphocytes by manual aspirate differential)  (See Comment)  -  By outside report, cytogenetic results are normal     Karyotype: 75, XX and FISH with 13q del but no 11q or 17p del    Repeat BM bx: (done after being off ibrutinib x1 month)  80% involvement by CLL    08/13/18: Presents to [facility] to discuss plan of care     Chronic lymphocytic leukemia (CLL), B-cell (CMS-HCC)   07/12/2018 Initial Diagnosis    Chronic lymphocytic leukemia (CLL), B-cell (CMS-HCC)     08/26/2018 -  Chemotherapy    Acalabrutinib started (approximate date)     11/14/2018 -  Chemotherapy    Acalabrutinib discontinued due to progressive bone marrow failure     12/06/2018 -  Chemotherapy    Ritux x1 given due to CLL still making up majority of BM cellularity (though in the setting of near aplasia of rest of TLH)     03/19/2019 -  Chemotherapy    Equine ATG / CsA for aplastic anemia (suspect 2/t acalabrutinib)     Chronic lymphocytic leukemia of B-cell type not having achieved remission (CMS-HCC)   01/21/2019 Initial Diagnosis    Chronic lymphocytic leukemia of B-cell type not having achieved remission (CMS-HCC)     01/01/2020 -  Chemotherapy    BMT IP BENDAMUSTINE / FLUDARABINE / RITUXIMAB (OP RITUXIMAB Days -13 & -6) (MUD)  bendamustine 130 mg/m2  IV on days -5, -4, -3   fludarabine 30 mg/m2 IV on days -5, -4, -3   riTUXimab 375 mg/m2 IV on day -13 as a OP treatment day, then 1,000 mg/m2 on days -6 as a OP treatment day, +1, +8   tacrolimus, 0.045 mg/kg PO BID on days -3 and -2 then  0.03 mg/kg PO BID starting on day -1   methotrexate 5 mg/m2 IV on days +1, +3, +6, +11   tbo-filgrastim 300 mcg (<= 75 kg) / 480 mcg (> 75 kg) q24h starting on day +7        Past Medical History:   Diagnosis Date   ??? Anxiety    ??? Depression    ??? Hypothyroid      Past Surgical History:   Procedure Laterality Date   ??? BONE MARROW BIOPSY & ASPIRATION  02/10/2020        ??? CESAREAN SECTION         Current Facility-Administered Medications:   ???  aluminum-magnesium hydroxide-simethicone (MAALOX MAX) 80-80-8 mg/mL oral suspension, 30 mL, Oral, Q4H PRN, France Ravens Lofland, FNP  ???  cefepime (MAXIPIME) 2 g in dextrose 100 mL IVPB (premix), 2 g, Intravenous, Q8H, Dillard Essex, FNP, Stopped at 03/01/20 1610  ???  CETAPHIL topical cleanser 1 application, 1 application, Topical, 4x Daily PRN, Dillard Essex, FNP  ???  docusate sodium (COLACE) capsule 100 mg, 100 mg, Oral, Daily PRN, Gerlene Burdock, AGNP, 100 mg at 03/01/20 1030  ???  eltrombopag (PROMACTA) tablet 100 mg, 100 mg, Oral, Daily, Dillard Essex, FNP  ???  emollient combination no.92 (LUBRIDERM) lotion 1 application, 1 application, Topical, 4x Daily PRN, Dillard Essex, FNP  ???  famotidine (PEPCID) tablet 20 mg, 20 mg, Oral, BID, Dillard Essex, FNP, 20 mg at 03/01/20 9604  ???  folic acid (FOLVITE) tablet 1 mg, 1 mg, Oral, At bedtime, Dillard Essex, FNP  ???  heparin, porcine (PF) 100 unit/mL injection 2 mL, 2 mL, Intravenous, Q MWF, Dillard Essex, FNP, 2 mL at 03/01/20 0857  ???  levothyroxine (SYNTHROID) tablet 100 mcg, 100 mcg, Oral, Daily, Dillard Essex, FNP, 100 mcg at 03/01/20 0510  ???  lithium (LITHOBID) ER tablet 300 mg, 300 mg, Oral, At bedtime, Dillard Essex, FNP  ???  OLANZapine zydis (ZyPREXA) disintegrating tablet 5 mg, 5 mg, Oral, Nightly, Dillard Essex, FNP  ???  ondansetron [facility]) injection 4 mg, 4 mg, Intravenous, Once, Marveen Reeks, MD  ???  ondansetron (ZOFRAN-ODT) disintegrating tablet 8 mg, 8 mg, Oral, Q8H, Dillard Essex, FNP  ???  polyethylene glycol (MIRALAX) packet 17 g, 17 g, Oral, Daily PRN, Dillard Essex, FNP  ???  posaconazole (NOXAFIL) delayed released tablet 300 mg, 300 mg, Oral, Daily, France Ravens Lofland, FNP, 300 mg at 03/01/20 0857  ???  sodium chloride (NS) 0.9 % infusion, 20 mL/hr, Intravenous, Continuous, Dillard Essex, FNP, Last Rate: 20 mL/hr at 03/01/20 0124, 20 mL/hr at 03/01/20 0124  ???  tacrolimus (PROGRAF) capsule 1.5 mg, 1.5 mg, Oral, BID, Dillard Essex, FNP, 1.5 mg at 03/01/20 0857  ???  tbo-filgrastim (GRANIX) injection 300 mcg, 300 mcg, Subcutaneous, Once, Gerlene Burdock, AGNP  ???  valACYclovir (VALTREX) tablet 500 mg, 500 mg, Oral, Daily, Dillard Essex, FNP, 500 mg at 03/01/20 0857  ???  vortioxetine (TRINTELLIX) tablet 10 mg, 10 mg, Oral, Daily, Doristine Locks, FNP  ???  zolpidem (AMBIEN) tablet 10 mg, 10 mg, Oral,  Nightly, Dillard Essex, FNP    Physical exam:  14, Cares for self; unable to carry on normal activity or to do active work.  BP 124/64  - Pulse 72  - Temp 36.9 ??C (98.5 ??F) (Oral)  - Resp 16  - Ht 165.1 cm (5' 5)  - Wt 56 kg (123 lb 6.4 oz)  - SpO2 99%  - BMI 20.53 kg/m??   General Appearance:   No acute distress, looks comfortable.   HEENT:                        Dentition No visible caries, gum disease, or infection. Oral mucosa is pink and moist.  Lungs:                Clear to auscultation bilaterally but diminished in bases bilaterally  Heart:                           Regular rate and rhythm w/out murmurs, clicks, s3 or s4.  s1 & s2 noted  Abdomen:                Soft, non-tender, non-distended. No organomegaly.   Extremities:              Warm and well perfused.  Lymphatics:   No palpable LAD.  Neurological:               Nonfocal and grossly intact.   Musculoskeletal:        Moves all extremities w/out difficulty.  No deformities or pain on exam    Lab Results   Component Value Date    WBC 0.8 (L) 02/29/2020    HGB 8.2 (L) 02/29/2020    HCT 21.8 (L) 02/29/2020    PLT 16 (L) 02/29/2020       Lab Results   Component Value Date    NA 139 02/29/2020    K 4.1 02/29/2020    CL 107 02/29/2020    CO2 25.0 02/29/2020    BUN 16 02/29/2020    CREATININE 0.81 (H) 02/29/2020    GLU 169 02/29/2020    CALCIUM 9.0 02/29/2020    MG 1.5 (L) 03/01/2020    PHOS 3.6 03/01/2020       Lab Results   Component Value Date    BILITOT 0.8 03/01/2020    BILIDIR 0.30 03/01/2020    PROT 5.9 03/01/2020    ALBUMIN 3.5 03/01/2020    ALT 71 (H) 03/01/2020    AST 45 (H) 03/01/2020    ALKPHOS 127 (H) 03/01/2020    GGT 22 01/09/2020 Lab Results   Component Value Date    INR 1.62 03/01/2020    INR 1.61 03/01/2020    APTT 31.9 03/01/2020    APTT 32.7 03/01/2020     Assessment and Plan:  Susan Kane is 59 yo woman with CLL as well as AA/bone marrow failure. She failed Rituxan and ATG/CSA and then tacrolimus. Her blood counts improve somewhat in response to tacrolimus but only that is far from normal and far from being able to sustain future treatment for CLL. While the treatment modalities for CLL have not been exhausted, patient's marrow reserve could not support further CLL therapy.     CLL: The CT of C/A/P on 10/10/19 revealed no evidence of LAD or organomegaly. There was possible endometrial fluid or thickening in the fundus of the uterus.  The BMBx on  10/07/19 showed:   Scant bone marrow sampling (<5% cellular) essentially devoid of hematopoietic elements.  -  Persistent chronic lymphocytic leukemia, representing 23% of marrow cellularity by flow cytometric analysis       -  Routine cytogenetic analysis is pending.   - Corresponding CBC: WBC 1.0, ANC 0.1.     12/26/19: BMBx:   -  Hypocellular bone marrow (20% overall) involved by chronic lymphocytic leukemia/small lymphocytic lymphoma, representing approximately 20% of marrow cells by PAX5 immunohistochemistry  - Normal karyotype: 46,XX[20]    BMT:??SD (stable disease)  HCT-CI (age adjusted) 5??(active CLL, psych, age adjusted)  Conditioning:  1. Rituximab 375 mg/m2 on day -13 and 1000 mg/m2 on D-6, +1, and +8  2. Bendamustine 130 mg/m2 IV daily over 10 minutes on D-5 through -3  3. Fludarabine 30 mg/m2 IV daily over 30 minutes on D-5 through -3    Donor: 10/10, ABO ), CMV negative  Engraftment: Granix starting D+12 through neutrophil recovery (as defined as ANC 1.0 x 2 days or 3.0 x 1 day)  - Viral studies showed weakly positive EBV, CMV, HHV-6, plan to monitor for now  - BMBx results showed hypocellular marrow without evidence of hematopiesis. Chimerism studies are still pending  GvHD prophylaxis:   1. Tacrolimus 0.045 mg/kg PO BID starting D-3 followed by 0.03 mg/kg PO BID starting on D-1 with a goal of 5-10 ng/mL  ??** Current dose 1/0.5mg  BID  2. Methotrexate 5 mg/m2 on D+1, +3, +6 and +11  3. rATG 1 mg/kg on D-2 and -1 included    Heme:??On Granix. Pt s/p 1unit plts 02/29/2020 for a plt count of 6 that increased to 15.  Hgb 6.4 for which she received 2units PRBC's on 02/29/2020 w/ improvement up to 8.2.    - Transfusion criteria: Transfuse 1 unit of PRBCs for Hgb<7 and 1 unit of platelets for Plt <10K or bleeding. No history of transfusion reactions.   - 8/9 Granix for ANC 0.5    ID:??  Neutropenic fever  Fever at home 100.4 and low grade in ER of 99.5.  Continue cefepime, no clinical indication to cont vanc at this time.  No s/s of sepsis.  CXR/Urinalysis clear, await blood/urine cx's.  Remains afebrile, VSS.  -Blood cultures pending  -Urine culture pending  -HHV6 pending  -CMV pending    Prophylaxis:  - Antiviral: Continue Valtrex 500 mg po daily on admit  - Antifungal: Posaconazole 300 mg daily  - Antibacterial: Holding outpatient Cefdinir while IP on cefepime starting on 02/29/2020   Will use cefdinir 300 BID for prophy. May be able to discontinue once ANC reaches 1,000 consistently.   ** Hx of nausea with levofloxacin when given with CNI and higher dose of Trintellix, so unclear which agent caused nausea. Patient ok with re-challenging with levofloxacin with plan to switch to cefdinir if she develops significant nausea while on levofloxacin  - PJP: Bactrim DS once daily MWF upon platelet engraftment. ??S/p pentamidine on 02/21/20. ??    CMV:  - 02/24/20: CMV 207  -8/10 CMV pending    HHV-6:  - Positive PCR on 7/15 with 40.6 cycles  - Repeat PCR showed increased cycles at 41.2  -8/10 HHV6 pending    EBV: negative    GI:??Pepcid for GERD prophylaxis    CINV:  - Anti-emetics per BMT protocol.   - Zofran works well for her  - Nightly zyprexa    Diarrhea: Resolved.   Constipation:??Miralax PRN and would add scheduled colace 100mg   bid should she begin to have issues with constipation but no c/o this at this time    Renal:   -??Cr??0.8 ??baseline of ~ 0.7    FEN: ??Electrolyte replacement per protocol  - Continue folic acid.  - Received some IVF's as flushes after antbx but no other.  Appears euvolemic on exam    Endocrine:  Hypothyroid:??  - Continue Synthroid daily.   - Check TSH, T3, T4    Hepatic:??  - VOD prophylaxis not warranted as non-myeloablative regimen.   ??  Hepatocellular liver injury pattern:  - Mild increase in AST, ALT, and AlkPhos, likely secondary to posaconazole  - Follow up repeat viral studies    CV:  HTN: currently not on treatment.    Pulm:  AHRF: First noted on 7/22. Intermittent O2 requirement with exam findings concerning for LLL crackles. No signs of volume overload on exam.  - Chest-X ray clear  - RA currently with saturation >92%    Neuro/Pain:??  - No c/o at this time    Psych:??Has a home psychiatrist.   Depression/Anxiety:   - Per outpt note pt was taking Xanax 0.25 mg BID PRN for anxiety but this was not on her home med list thus I did not re-order at this time. Discussed with patient who stated she rarely takes.  - Continue Lithium 300mg  nightly  - Trinellix 10mg  nightly however, not on formulary. Pharmacy to approve home meds.  - Psych consult PRN.     Insomnia:  - Ambien 10mg  nightly ordered    Caregiver/Lodging:  - Husband [name] will be primary caregiver  - Plan to stay in local rental post transplant??      Jasmine Pang. Aurther Loft, MSN, APRN, AGNP-C  [facility]    I personally spent 30 minutes face-to-face and non-face-to-face in the care of this patient, which includes all pre, intra, and post visit time on the date of service.    Attending Attestation:    It was medically necessary for me to see the patient in addition to the APP because Susan Kane is a complex oncology patient s/p high dose chemotherapy and allogeneic stem cell transplantation at risk for complications including nausea, vomiting, diarrhea, graft versus host disease, renal insufficiency, hypertension, hypotension, infection, and sepsis.  My visit included a history, physical exam, and review of pertinent test results.    Susan Kane is admitted for neutropenic fever.  No obvious localizing signs or symptoms.  We will plan to cover her empirically with single agent cefepime.  We will repeat CMV and HHV6 testing as these have been low positive in clinic.    Genene Churn. Sheryn Bison, MD

## 2020-03-01 NOTE — Unmapped (Unsigned)
BMT CLINIC FOLLOW UP    Susan Kane  59 y.o. 1961-06-20   BMT MD:  Susan Boast, MD    Referring Oncologist: Susan Kane  Date: 02/24/20    Disease: CLL  Current disease status: SD (stable disease)  Type of Transplant:??RIC MUD Allo  Graft Source: Cryopreserved PBSCs  Transplant Day:??D+47??(03/01/20)  ??  Study Participant: BMT CTN 1704: Composite Health Assessment Risk Model for Older Adults: Applying Pre-transplant Comorbidity, Geriatric Assessment and Biomarkers to Predict Non-Relapse Mortality After Allogeneic Transplant (CHARM).  ??  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 She remains on eltrobopag and Granix.     Interval History:  Susan Kane is her today for follow up and she is with her ***    Fatigue  Sleeping  N/V:  Usually takes zofran prior to meals  Dysgeusia  Drinking  Constipation/colace  Rash  ??  ROS:  Comprehensive ROS negative except pertinent positives listed in interval history.     Oncology History Overview Note   CLL:    Summary as per Dr. Kalman Drape most recent note with minor edits/additions from my review of records    1.??CLL?????diagnosed in August 2010, flow cytometry consistent with CLL  ?? Enlarged left??inguinal lymph node January 2019,??small??neck/axillary nodes and palpable splenomegaly 09/11/2017  ?? CTs??on 09/17/2017-3 cm necrotic appearing lymph node in the left inguinal region, borderline enlarged pelvic/retroperitoneal, chest, and axillary nodes. ??Mild splenomegaly.  ?? Ultrasound-guided biopsy of the left inguinal lymph node 09/18/2017, slightly purulent fluid aspirated, core biopsy is consistent with an atypical lymphoid proliferation???extensive necrosis with surrounding epithelioid histiocytes, limited intact lymphoid tissue involved with CLL  ?? Incisional biopsy of a necrotic/purulent left inguinal lymph node on 10/01/2017???extensive necrosis with granulomatous inflammation, small amount of viable lymphoid tissue involved with CLL, AFB and fungal stains negative  ?? Peripheral blood FISH analysis 02/05/2018??? +deletion 13q14, no evidence of p53 (17p13) deletion, no evidence of 11q22??deletion  - normal karyotype (46,XX)  ?? Bone marrow biopsy 02/26/2018???hypercellular marrow with extensive involvement by CLL, lymphocytes represent??85% of all cells  ?? Ibrutinib initiated 04/03/2018  ?? Ibrutinib placed on hold 04/11/2018 due to onset of arthralgias  ?? Ibrutinib resumed 04/16/2018, discontinued 04/25/2018 secondary to severe arthralgias/arthritis  ?? Ibrutinib resumed at a dose of 140 mg daily 05/03/2018  ?? Ibrutinib dose adjusted to 140 mg alternating with 280 mg 06/25/2018  ?? 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 ??Augmentin prescribed.  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 to discuss plan of  care     Chronic lymphocytic leukemia (CLL), B-cell (CMS-HCC)   07/12/2018 Initial Diagnosis    Chronic lymphocytic leukemia (CLL), B-cell (CMS-HCC)     08/26/2018 -  Chemotherapy    Acalabrutinib started (approximate date)     11/14/2018 -  Chemotherapy    Acalabrutinib discontinued due to progressive bone marrow failure     12/06/2018 -  Chemotherapy    Ritux x1 given due to CLL still making up majority of BM cellularity (though in the setting of near aplasia of rest of TLH)     03/19/2019 -  Chemotherapy    Equine ATG / CsA for aplastic anemia (suspect 2/t acalabrutinib)     Chronic lymphocytic leukemia of B-cell type not having achieved remission (CMS-HCC)   01/21/2019 Initial Diagnosis    Chronic lymphocytic leukemia of B-cell type not having achieved remission (CMS-HCC)     01/01/2020 -  Chemotherapy    BMT IP BENDAMUSTINE / FLUDARABINE / RITUXIMAB (OP RITUXIMAB Days -13 & -6) (MUD)  bendamustine 130 mg/m2 IV on days -5, -4, -3   fludarabine 30 mg/m2 IV on days -5, -4, -3   riTUXimab 375 mg/m2 IV on day -13 as a OP treatment day, then 1,000 mg/m2 on days -6 as a OP treatment day, +1, +8   tacrolimus, 0.045 mg/kg PO BID on days -3 and -2 then  0.03 mg/kg PO BID starting on day -1   methotrexate 5 mg/m2 IV on days +1, +3, +6, +11   tbo-filgrastim 300 mcg (<= 75 kg) / 480 mcg (> 75 kg) q24h starting on day +7        Past Medical History:   Diagnosis Date   ??? Anxiety    ??? Depression    ??? Hypothyroid      Past Surgical History:   Procedure Laterality Date   ??? BONE MARROW BIOPSY & ASPIRATION  02/10/2020        ??? CESAREAN SECTION       No current facility-administered medications for this visit.    Current Outpatient Medications:   ???  cefdinir (OMNICEF) 300 MG capsule, Take 1 capsule (300 mg total) by mouth Two (2) times a day., Disp: 30 capsule, Rfl: 0  ???  eltrombopag (PROMACTA) 50 MG tablet, Take 2 tablets (100 mg total) by mouth daily. Administer on an empty stomach, 1 hour before or 2 hours after a meal., Disp: 60 tablet, Rfl: 2  ???  famotidine (PEPCID) 20 MG tablet, Take 1 tablet (20 mg total) by mouth Two (2) times a day., Disp: 60 tablet, Rfl: 0  ???  folic acid (FOLVITE) 1 MG tablet, Take 1 tablet (1 mg total) by mouth at bedtime., Disp: 30 tablet, Rfl: 5  ???  levothyroxine (SYNTHROID) 100 MCG tablet, Take 1 tablet (100 mcg total) by mouth daily., Disp: 30 tablet, Rfl: 5  ???  lithium (LITHOBID) 300 MG ER tablet, Take 1 tablet (300 mg total) by mouth at bedtime., Disp: 30 tablet, Rfl: 5  ???  magnesium oxide-Mg AA chelate (MAGNESIUM, AMINO ACID CHELATE,) 133 mg Tab, Take 1 tablet by mouth Two (2) times a day., Disp: 100 tablet, Rfl: 2  ???  OLANZapine zydis (ZYPREXA) 5 MG disintegrating tablet, Take 1 tablet (5 mg total) by mouth nightly., Disp: 30 tablet, Rfl: 2  ???  ondansetron (ZOFRAN-ODT) 8 MG disintegrating tablet, Dissolve  1 tablet (8 mg total) by mouth every eight (8) hours as needed for nausea., Disp: 30 tablet, Rfl: 2  ???  posaconazole (NOXAFIL) 100 mg TbEC delayed released tablet, Take 3 tablets (300 mg total) by mouth daily., Disp: 90 tablet, Rfl: 1  ???  tacrolimus (PROGRAF) 0.5 MG capsule, Take 3 capsules (1.5 mg total) by mouth two (2) times a day., Disp: 180 capsule, Rfl: 5  ???  TRINTELLIX 10 mg tablet, Take 1 tablet (10 mg total) by mouth at bedtime., Disp: 30 tablet, Rfl: 5  ???  valACYclovir (VALTREX) 500 MG tablet, Take 1 tablet (500 mg total) by mouth daily., Disp: 30 tablet, Rfl: 11  ???  zolpidem (AMBIEN) 10 mg tablet, Take 1 tablet (10 mg total) by mouth nightly as needed for sleep., Disp: 20 tablet, Rfl: 0    Facility-Administered Medications Ordered in Other Visits:   ???  cefepime (MAXIPIME) 2 g in dextrose 100 mL IVPB (premix), 2 g, Intravenous, Once, Marveen Reeks, MD  ???  heparin, porcine (PF) 100 unit/mL injection 200 Units, 200 Units, Intravenous, Q30 Min PRN, Pernell Dupre, MD, 200 Units at 02/29/20 1242    Physical exam:  69, Cares for self; unable to carry on normal activity or to do active work.  There were no vitals taken for this visit.  General Appearance:   No acute distress, looks comfortable.   HEENT: Dentition No visible caries, gum disease, or infection. Oral mucosa is pink and moist.  Lungs:                Clear to auscultation bilaterally  Heart:                           Regular rate and rhythm  Abdomen:                Soft, non-tender, non-distended. No organomegaly.   Extremities:              Warm and well perfused.  Lymphatics:   No palpable LAD.  Neurological:               Nonfocal and grossly intact.   Musculoskeletal:         Positive for decreased range of motion in bilateral shoulders due to discomfort.    Lab Results   Component Value Date    WBC 0.7 (L) 02/29/2020    HGB 6.4 (L) 02/29/2020    HCT 17.7 (L) 02/29/2020    PLT 15 (L) 02/29/2020       Lab Results   Component Value Date    NA 137 02/27/2020    K 3.7 02/27/2020    CL 106 02/27/2020    CO2 25.0 02/27/2020    BUN 15 02/27/2020    CREATININE 0.93 (H) 02/27/2020    GLU 197 (H) 02/27/2020    CALCIUM 9.3 02/27/2020    MG 1.5 (L) 02/27/2020    PHOS 3.9 02/23/2020       Lab Results   Component Value Date    BILITOT 0.6 02/27/2020    BILIDIR 0.20 02/23/2020    PROT 6.0 02/27/2020    ALBUMIN 3.6 02/27/2020    ALT 61 (H) 02/27/2020    AST 39 (H) 02/27/2020    ALKPHOS 138 (H) 02/27/2020    GGT 22 01/09/2020       Lab Results   Component Value Date    INR 1.51 02/23/2020    APTT 46.4 (H) 02/23/2020     Assessment and Plan:  Mrs. Ciano is 59 yo woman with CLL as well as AA/bone  marrow failure. She failed Rituxan and ATG/CSA and then tacrolimus. Her blood counts improve somewhat in response to tacrolimus but only that is far from normal and far from being able to sustain future treatment for CLL. While the treatment modalities for CLL have not been exhausted, patient's marrow reserve could not support further CLL therapy.     CLL: The CT of C/A/P on 10/10/19 revealed no evidence of LAD or organomegaly. There was possible endometrial fluid or thickening in the fundus of the uterus.  The BMBx on 10/07/19 showed:   Scant bone marrow sampling (<5% cellular) essentially devoid of hematopoietic elements.  -  Persistent chronic lymphocytic leukemia, representing 23% of marrow cellularity by flow cytometric analysis       -  Routine cytogenetic analysis is pending.   - Corresponding CBC: WBC 1.0, ANC 0.1.     12/26/19: BMBx:   -  Hypocellular bone marrow (20% overall) involved by chronic lymphocytic leukemia/small lymphocytic lymphoma, representing approximately 20% of marrow cells by PAX5 immunohistochemistry  - Normal karyotype: 46,XX[20]    BMT:??SD (stable disease)  HCT-CI (age adjusted) 5??(active CLL, psych, age adjusted)  Conditioning:  1. Rituximab 375 mg/m2 on day -13 and 1000 mg/m2 on D-6, +1, and +8  2. Bendamustine 130 mg/m2 IV daily over 10 minutes on D-5 through -3  3. Fludarabine 30 mg/m2 IV daily over 30 minutes on D-5 through -3    Donor: 10/10, ABO ), CMV negative  Engraftment: Granix starting D+12 through neutrophil recovery (as defined as ANC 1.0 x 2 days or 3.0 x 1 day)  - Viral studies showed weakly positive EBV, CMV, HHV-6, plan to monitor for now  - BMBx results showed hypocellular marrow without evidence of hematopiesis. Chimerism studies are still pending  GvHD prophylaxis:   1. Tacrolimus, goal of 5-10 ng/mL, managed by BMT pharmacists  ??** Current dose 1/0.5mg  BID***  2. Methotrexate 5 mg/m2 on D+1, +3, +6 and +11  3. rATG 1 mg/kg on D-2 and -1 included    Heme:??On Granix.  Dose to be given today.  Plt is 9K. Will give a unit today. ***    - Transfusion criteria: Transfuse 1 unit of PRBCs for Hgb<7 and 1 unit of platelets for Plt <10K or bleeding. No history of transfusion reactions.     ID:??  Prophylaxis:  - Antiviral: Valtrex 500 mg po daily on admit  - Antifungal: Posaconazole 300 mg daily  - Antibacterial: Cefdinir 300 BID for prophy. May be able to discontinue once ANC reaches 1,000 consistently.   ** Hx of nausea with levofloxacin when given with CNI and higher dose of Trintellix, so unclear which agent caused nausea. Patient ok with re-challenging with levofloxacin with plan to switch to cefdinir if she develops significant nausea while on levofloxacin  - PJP: Bactrim DS once daily MWF upon platelet engraftment. ??S/p pentamidine on 02/21/20. ??    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     CINV:  - Anti-emetics per BMT protocol.   - Zofran works well for her, scheduled ODT q 8 hr (6/22)  - Nightly zyprexa (added 7/2)    Diarrhea: Resolved.   Constipation:??Changing miralax to PRN, senna at bedtime Ok to add colace.***    Renal:   -??Cr??0.9; ??baseline  of ~ 0.7***    FEN: ??Electrolyte replacement per protocol  - Continue folic acid.    Endocrine:  Hypothyroid:??  - Continue Synthroid daily.     Hepatic:??  - VOD prophylaxis not warranted as non-myeloablative regimen.   ??  Hepatocellular liver injury pattern:  - Mild increase in AST, ALT, and AlkPhos, likely secondary to posaconazole  - Follow up repeat viral studies on Monday ***    CV:  HTN: currently not on treatment.    Pulm:  AHRF: First noted on 7/22. Intermittent O2 requirement with exam findings concerning for LLL crackles. No signs of volume overload on exam.  - Chest-X ray x2 clear  - Assess O2 requirement with walk test  - Consider CT Chest w/o contrast if persistent    Neuro/Pain:??  - Oxycodone 5 mg po prn    Psych:??Has a home psychiatrist.   Depression/Anxiety:   - Continue Xanax 0.25 mg BID PRN for anxiety  - Continue Lithium 300mg  nightly  - Continue Trinellix 10mg  nightly (patient will bring her home supply).  - Psych consult PRN.     Insomnia:  - Ambien 5mg  nightly.     Caregiver/Lodging:  - Husband [name] will be primary caregiver  - Plan to stay in local rental post transplant??    Summary:  Plt in infusion today.   We will have her come on Sunday. May need PRBCs on Sunday. Type and screen sent as well.  Will send plt ab screen. She holds on to plts poorly.   No med changes today.       Jameson Morrow Elie Confer, [facility]  Physician Assistant  Adult Bone Marrow Transplantation    I personally spent *** minutes face-to-face and non-face-to-face in the care of this patient, which includes all pre, intra, and post visit time on the date of service.

## 2020-03-01 NOTE — Unmapped (Signed)
Susan Kane is a 59 y.o.??woman??with a diagnosis of CLL??and aplastic anemia who is now s/p RIC MUD AlloSCT, D+46, with a course marked by prolonged neutropenia now with slow engraftment.  ??  She was seen in clinic on 8/8.  Labs are notable for WBC 0.7 with ANC 0.4, hemoglobin 6.4, and platelets 6.  She received 1 unit of platelets with increased to 15 and 2 units PRBCs.  This evening, her temperature was noted to be 100.3 ??F.  She was instructed to recheck in an hour with recheck temperature 100.5 ??F.  She had just eaten some hot soup they retook her temperature again and it was noted to be 100.4 ??F.  She has a mild headache but otherwise feels well.    Patient was told to came to ED for fever and low platelets

## 2020-03-01 NOTE — Unmapped (Addendum)
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    She was seen in clinic on 8/8.  Labs are notable for WBC 0.7 with ANC 0.4, hemoglobin 6.4, and platelets 6.  She received 1 unit of platelets with increased to 15 and 2 units PRBCs.  This evening, her temperature was noted to be 100.3 ??F.  She was instructed to recheck in an hour with recheck temperature 100.5 ??F.  She had just eaten some hot soup they retook her temperature again and it was noted to be 100.4 ??F.  She has a mild headache but otherwise feels well.    Assessment/Plan:   As she is neutropenic with ANC 0.4 and febrile she was instructed to present to the [facility] emergency department for evaluation and admission.  She will require neutropenic fever work-up with blood cultures (peripheral and central line), chest x-ray, and urinalysis.  Additional work-up as indicated by symptoms.  She will require initiation of antibiotics with cefepime and possible addition of vancomycin if concern for line infection or other skin/soft tissue infection.    Plan to admit to Med T for neutropenic fever.     Please page Med T Fellow/Cross-Cover at [phone] if questions about care occur.     Fellow Taking Call:  Nelia Shi  February 29, 2020 8:39 PM

## 2020-03-02 DIAGNOSIS — Z9484 Stem cells transplant status: Principal | ICD-10-CM

## 2020-03-02 LAB — CBC W/ AUTO DIFF
BASOPHILS ABSOLUTE COUNT: 0 10*9/L (ref 0.0–0.1)
BASOPHILS RELATIVE PERCENT: 0.6 %
EOSINOPHILS ABSOLUTE COUNT: 0 10*9/L (ref 0.0–0.4)
EOSINOPHILS RELATIVE PERCENT: 0 %
HEMATOCRIT: 23.3 % — ABNORMAL LOW (ref 36.0–46.0)
HEMOGLOBIN: 8.1 g/dL — ABNORMAL LOW (ref 12.0–16.0)
LARGE UNSTAINED CELLS: 9 % — ABNORMAL HIGH (ref 0–4)
LYMPHOCYTES ABSOLUTE COUNT: 0.1 10*9/L — ABNORMAL LOW (ref 1.5–5.0)
MEAN CORPUSCULAR HEMOGLOBIN CONC: 34.8 g/dL (ref 31.0–37.0)
MEAN CORPUSCULAR HEMOGLOBIN: 27.4 pg (ref 26.0–34.0)
MEAN CORPUSCULAR VOLUME: 78.9 fL — ABNORMAL LOW (ref 80.0–100.0)
MONOCYTES ABSOLUTE COUNT: 0.1 10*9/L — ABNORMAL LOW (ref 0.2–0.8)
MONOCYTES RELATIVE PERCENT: 7 %
NEUTROPHILS ABSOLUTE COUNT: 1.2 10*9/L — ABNORMAL LOW (ref 2.0–7.5)
NEUTROPHILS RELATIVE PERCENT: 75.8 %
RED BLOOD CELL COUNT: 2.95 10*12/L — ABNORMAL LOW (ref 4.00–5.20)
WBC ADJUSTED: 1.5 10*9/L — ABNORMAL LOW (ref 4.5–11.0)

## 2020-03-02 LAB — BASIC METABOLIC PANEL
ANION GAP: 6 mmol/L (ref 5–14)
BUN / CREAT RATIO: 19
CALCIUM: 9.4 mg/dL (ref 8.7–10.4)
CHLORIDE: 108 mmol/L — ABNORMAL HIGH (ref 98–107)
CO2: 26 mmol/L (ref 20.0–31.0)
CREATININE: 0.8 mg/dL
EGFR CKD-EPI NON-AA FEMALE: 80 mL/min/{1.73_m2} (ref >=60)
POTASSIUM: 3.6 mmol/L (ref 3.4–4.5)
SODIUM: 140 mmol/L (ref 135–145)

## 2020-03-02 LAB — MAGNESIUM
Magnesium:MCnc:Pt:Ser/Plas:Qn:: 1.3 — ABNORMAL LOW
Magnesium:MCnc:Pt:Ser/Plas:Qn:: 2.1

## 2020-03-02 LAB — CMV COMMENT: Lab: 0

## 2020-03-02 LAB — CMV DNA, QUANTITATIVE, PCR: CMV QUANT LOG10: 2.95 {Log_IU}/mL — ABNORMAL HIGH (ref <0.00)

## 2020-03-02 LAB — ANION GAP: Anion gap 3:SCnc:Pt:Ser/Plas:Qn:: 6

## 2020-03-02 LAB — LARGE UNSTAINED CELLS: Lab: 9 — ABNORMAL HIGH

## 2020-03-02 LAB — TACROLIMUS, TROUGH: Lab: 7.8

## 2020-03-02 LAB — HHV6 PCR, BLOOD: Herpes virus 6 DNA:PrThr:Pt:Ser:Ord:Probe.amp.tar: NEGATIVE

## 2020-03-02 MED ADMIN — magnesium sulfate 2gm/50mL IVPB: 2 g | INTRAVENOUS | @ 08:00:00 | Stop: 2020-03-02

## 2020-03-02 MED ADMIN — posaconazole (NOXAFIL) delayed released tablet 300 mg: 300 mg | ORAL | @ 12:00:00

## 2020-03-02 MED ADMIN — OLANZapine zydis (ZyPREXA) disintegrating tablet 5 mg: 5 mg | ORAL | @ 01:00:00

## 2020-03-02 MED ADMIN — potassium chloride (KLOR-CON) CR tablet 20 mEq: 20 meq | ORAL | @ 12:00:00 | Stop: 2020-03-02

## 2020-03-02 MED ADMIN — prochlorperazine (COMPAZINE) tablet 10 mg: 10 mg | ORAL | @ 01:00:00 | Stop: 2020-03-01

## 2020-03-02 MED ADMIN — tacrolimus (PROGRAF) capsule 1.5 mg: 1.5 mg | ORAL | @ 12:00:00

## 2020-03-02 MED ADMIN — famotidine (PEPCID) tablet 20 mg: 20 mg | ORAL | @ 12:00:00

## 2020-03-02 MED ADMIN — cefepime (MAXIPIME) 2 g in dextrose 100 mL IVPB (premix): 2 g | INTRAVENOUS | @ 10:00:00 | Stop: 2020-03-02

## 2020-03-02 MED ADMIN — vortioxetine (TRINTELLIX) tablet 10 mg: 10 mg | ORAL | @ 01:00:00

## 2020-03-02 MED ADMIN — potassium chloride 20 mEq in 100 mL IVPB Premix: 20 meq | INTRAVENOUS | @ 08:00:00 | Stop: 2020-03-02

## 2020-03-02 MED ADMIN — lithium (LITHOBID) ER tablet 300 mg: 300 mg | ORAL | @ 01:00:00

## 2020-03-02 NOTE — Unmapped (Addendum)
BMT PROGRESS NOTE    IYONNA RISH  59 y.o. [birth year]   BMT MD:  Lanae Boast, MD    Referring Oncologist: Dr. Jillyn [facility]) [name] [phone]  Date: 03/02/20    Disease: CLL  Current disease status: SD (stable disease)  Type of Transplant:??RIC MUD Allo  Graft Source: Cryopreserved PBSCs  Transplant Day:??D+48??(03/02/20)  ??  Study Participant: BMT CTN 1704: Composite Health Assessment Risk Model for Older Adults: Applying Pre-transplant Comorbidity, Geriatric Assessment and Biomarkers to Predict Non-Relapse Mortality After Allogeneic Transplant (CHARM).  ??  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  She was discharged from hospital on 02/23/2020.  Please this note for details regarding her admission.   She remains on eltrobopag and Granix.     Mrs. Peden was was seen in clinic on 02/27/20 with plan to have plt transfusion and f.u in infusion clinic on 02/29/20 for possible plts and blood.  No med changes were made during this visit.  The patient called into on call provider w/ c/o a fever of 100.3 by which she was asked to recheck in an hour.  Her repeat temp was 100.5 however, the patient had apparently eaten some hot rice so there was a questionable discrepancy.  She waited about63min then retook and her temp was 100.4 for which she was advised to come to ER for further work up.  The patient's daughter who is at bedside stated the temp was vacillating from 99-100.9 so she is not so sure that there isn't an issue with the thermometer despite it being new.  Pt stated she has had difficulty with nausea while taking meds thus she attempted to divide up some of her daily dosed meds and take some in AM and some in PM.  She stated she has been taking zofran scheduled and zyprexa as well.  Stated her taste is altered but has been consuming 3 small meals/day and drinks a variety of fluids totaling probably 1.5liters/day.  Denied myalgia's, chills, URI/UTI s/s, exposure to sick/covid contacts, diarrhea.      In the ER the pt's temp was 99.5, bp 107/70, pulse 91 and RA w/out s/s of hypoxia and saturation of 99%.  She was pan cultured, UA with small blood, rare bacteria and neg leuk's, 2 WBC's.  CXR w/out overt consolidation and clear.  Pt given a dose of vanc w/out indication clinically to continue and cefepime.      Interval History: Today Ms. Pultz reports feeling well. She is afebrile, no growth noted on blood and urine cultures at 24 hours. Granix administered 8/9, ANC today is 1.2. 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 Plan to discontinue cefepime today and possibly discharge 8/11 if afebrile and asymptomatic overnight.    Family History  The patient has:      Siblings: half brother (has DM2)  Children: three children. Daughter 49 and twin sons 14 (all healthy)  ??Her daughter moved to [place] recently and is an Pensions consultant.  Her twin boys live together in [place].   ??  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  Married to her current  husband for the past 4 years.   ??  ROS:  Comprehensive ROS negative except pertinent positives listed in interval history.     Oncology History Overview Note   CLL:    Summary as per Dr. Kalman Drape most recent note with minor edits/additions from my review of records    1.??CLL?????diagnosed in August 2010, flow cytometry consistent with CLL  ?? Enlarged left??inguinal lymph node January 2019,??small??neck/axillary nodes and palpable splenomegaly 09/11/2017  ?? CTs??on 09/17/2017-3 cm necrotic appearing lymph node in the left inguinal region, borderline enlarged pelvic/retroperitoneal, chest, and axillary nodes. ??Mild splenomegaly.  ?? Ultrasound-guided biopsy of the left inguinal lymph node 09/18/2017, slightly purulent fluid aspirated, core biopsy is consistent with an atypical lymphoid proliferation???extensive necrosis with surrounding epithelioid histiocytes, limited intact lymphoid tissue involved with CLL  ?? Incisional biopsy of a necrotic/purulent left inguinal lymph node on 10/01/2017???extensive necrosis with granulomatous inflammation, small amount of viable lymphoid tissue involved with CLL, AFB and fungal stains negative  ?? Peripheral blood FISH analysis 02/05/2018??? +deletion 13q14, no evidence of p53 (17p13) deletion, no evidence of 11q22??deletion  - normal karyotype (46,XX)  ?? Bone marrow biopsy 02/26/2018???hypercellular marrow with extensive involvement by CLL, lymphocytes represent??85% of all cells  ?? Ibrutinib initiated 04/03/2018  ?? Ibrutinib placed on hold 04/11/2018 due to onset of arthralgias  ?? Ibrutinib resumed 04/16/2018, discontinued 04/25/2018 secondary to severe arthralgias/arthritis  ?? Ibrutinib resumed at a dose of 140 mg daily 05/03/2018  ?? Ibrutinib dose adjusted to 140 mg alternating with 280 mg 06/25/2018  ?? 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 ??Augmentin prescribed.  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    Baseline BM bx reviewed at [facility]  Final Diagnosis   (Outside Case #:  [id number], dated 02/26/2018)  Bone marrow, aspiration and biopsy  -  Hypercellular bone marrow (80%) with extensive involvement by chronic lymphocytic leukemia (87% lymphocytes by manual aspirate differential)  (See Comment)  -  By outside report, cytogenetic results are normal     Karyotype: 70, XX and FISH with 13q del but no 11q or 17p del    Repeat BM bx: (done after being off ibrutinib x1 month)  80% involvement by CLL    08/13/18: Presents to [facility] to discuss plan of care     Chronic lymphocytic leukemia (CLL), B-cell (CMS-HCC)   07/12/2018 Initial Diagnosis    Chronic lymphocytic leukemia (CLL), B-cell (CMS-HCC)     08/26/2018 -  Chemotherapy    Acalabrutinib started (approximate date)     11/14/2018 -  Chemotherapy    Acalabrutinib discontinued due to progressive bone marrow failure     12/06/2018 -  Chemotherapy    Ritux x1 given due to CLL still making up majority of BM cellularity (though in the setting of near aplasia of rest of TLH)     03/19/2019 -  Chemotherapy    Equine ATG / CsA for aplastic anemia (suspect 2/t acalabrutinib)     Chronic lymphocytic leukemia of B-cell type not having achieved remission (CMS-HCC)   01/21/2019 Initial Diagnosis    Chronic lymphocytic leukemia of B-cell type not having achieved remission (CMS-HCC)     01/01/2020 -  Chemotherapy    BMT IP BENDAMUSTINE / FLUDARABINE / RITUXIMAB (OP  RITUXIMAB Days -13 & -6) (MUD)  bendamustine 130 mg/m2 IV on days -5, -4, -3   fludarabine 30 mg/m2 IV on days -5, -4, -3   riTUXimab 375 mg/m2 IV on day -13 as a OP treatment day, then 1,000 mg/m2 on days -6 as a OP treatment day, +1, +8   tacrolimus, 0.045 mg/kg PO BID on days -3 and -2 then  0.03 mg/kg PO BID starting on day -1   methotrexate 5 mg/m2 IV on days +1, +3, +6, +11   tbo-filgrastim 300 mcg (<= 75 kg) / 480 mcg (> 75 kg) q24h starting on day +7        Past Medical History:   Diagnosis Date   ??? Anxiety    ??? Depression    ??? Hypothyroid      Past Surgical History:   Procedure Laterality Date   ??? BONE MARROW BIOPSY & ASPIRATION  02/10/2020        ??? CESAREAN SECTION         Current Facility-Administered Medications:   ???  aluminum-magnesium hydroxide-simethicone (MAALOX MAX) 80-80-8 mg/mL oral suspension, 30 mL, Oral, Q4H PRN, France Ravens Lofland, FNP  ???  cefepime (MAXIPIME) 2 g in dextrose 100 mL IVPB (premix), 2 g, Intravenous, Q8H, Dillard Essex, FNP, Stopped at 03/02/20 [phone]  ???  CETAPHIL topical cleanser 1 application, 1 application, Topical, 4x Daily PRN, Dillard Essex, FNP  ???  docusate sodium (COLACE) capsule 100 mg, 100 mg, Oral, Daily PRN, Gerlene Burdock, AGNP, 100 mg at 03/01/20 1030  ???  eltrombopag (PROMACTA) tablet 100 mg, 100 mg, Oral, Daily, Dillard Essex, FNP, 100 mg at 03/02/20 3086  ???  emollient combination no.92 (LUBRIDERM) lotion 1 application, 1 application, Topical, 4x Daily PRN, Dillard Essex, FNP  ???  famotidine (PEPCID) tablet 20 mg, 20 mg, Oral, BID, Dillard Essex, FNP, 20 mg at 03/02/20 0802  ???  folic acid (FOLVITE) tablet 1 mg, 1 mg, Oral, At bedtime, Dillard Essex, FNP, 1 mg at 03/01/20 2036  ???  heparin, porcine (PF) 100 unit/mL injection 2 mL, 2 mL, Intravenous, Q MWF, France Ravens Lofland, FNP, 2 mL at 03/01/20 0857  ???  levothyroxine (SYNTHROID) tablet 100 mcg, 100 mcg, Oral, Daily, Dillard Essex, FNP, 100 mcg at 03/02/20 5784  ???  lithium (LITHOBID) ER tablet 300 mg, 300 mg, Oral, At bedtime, Dillard Essex, FNP, 300 mg at 03/01/20 2036  ???  OLANZapine zydis (ZyPREXA) disintegrating tablet 5 mg, 5 mg, Oral, Nightly, Dillard Essex, FNP, 5 mg at 03/01/20 2035  ???  ondansetron (ZOFRAN) injection 4 mg, 4 mg, Intravenous, Once, Marveen Reeks, MD  ???  ondansetron (ZOFRAN-ODT) disintegrating tablet 8 mg, 8 mg, Oral, Q8H, Dillard Essex, FNP, 8 mg at 03/02/20 6962  ???  polyethylene glycol (MIRALAX) packet 17 g, 17 g, Oral, Daily PRN, Dillard Essex, FNP  ???  posaconazole (NOXAFIL) delayed released tablet 300 mg, 300 mg, Oral, Daily, Dillard Essex, FNP, 300 mg at 03/02/20 0802  ???  sodium chloride (NS) 0.9 % infusion, 20 mL/hr, Intravenous, Continuous, Dillard Essex, FNP, Last Rate: 20 mL/hr at 03/01/20 0124, 20 mL/hr at 03/01/20 0124  ???  tacrolimus (PROGRAF) capsule 1.5 mg, 1.5 mg, Oral, BID, Dillard Essex, FNP, 1.5 mg at 03/02/20 0753  ???  valACYclovir (VALTREX) tablet 500 mg, 500 mg, Oral, Daily, Dillard Essex, FNP, 500 mg at 03/02/20 0802  ???  vortioxetine (TRINTELLIX) tablet 10 mg,  10 mg, Oral, Daily, Doristine Locks, FNP, 10 mg at 03/01/20 2036  ???  zolpidem (AMBIEN) tablet 10 mg, 10 mg, Oral, Nightly, Dillard Essex, FNP, 10 mg at 03/01/20 2036    Physical exam:  69, Cares for self; unable to carry on normal activity or to do active work.  BP 111/70  - Pulse 76  - Temp 36.6 ??C (97.9 ??F) (Oral)  - Resp 20  - Ht 165.1 cm (5' 5)  - Wt 55.6 kg (122 lb 8 oz)  - SpO2 98%  - BMI 20.39 kg/m??   General Appearance:   No acute distress, looks comfortable.   HEENT:                        Dentition No visible caries, gum disease, or infection. Oral mucosa is pink and moist.  Lungs:                Clear to auscultation bilaterally but diminished in bases bilaterally  Heart:                           Regular rate and rhythm w/out murmurs, clicks, s3 or s4.  s1 & s2 noted  Abdomen:                Soft, non-tender, non-distended. No organomegaly.   Extremities:              Warm and well perfused.  Lymphatics:   No palpable LAD.  Neurological:               Nonfocal and grossly intact.   Musculoskeletal:        Moves all extremities w/out difficulty.  No deformities or pain on exam    Lab Results   Component Value Date    WBC 1.5 (L) 03/02/2020    HGB 8.1 (L) 03/02/2020    HCT 23.3 (L) 03/02/2020    PLT 14 (L) 03/02/2020       Lab Results   Component Value Date    NA 140 03/02/2020    K 3.6 03/02/2020    CL 108 (H) 03/02/2020    CO2 26.0 03/02/2020    BUN 15 03/02/2020    CREATININE 0.80 03/02/2020    GLU 152 03/02/2020    CALCIUM 9.4 03/02/2020    MG 2.1 03/02/2020    PHOS 3.6 03/01/2020       Lab Results   Component Value Date    BILITOT 0.8 03/01/2020    BILIDIR 0.30 03/01/2020    PROT 5.9 03/01/2020 ALBUMIN 3.5 03/01/2020    ALT 71 (H) 03/01/2020    AST 45 (H) 03/01/2020    ALKPHOS 127 (H) 03/01/2020    GGT 22 01/09/2020       Lab Results   Component Value Date    INR 1.62 03/01/2020    INR 1.61 03/01/2020    APTT 31.9 03/01/2020    APTT 32.7 03/01/2020     Assessment and Plan:  Mrs. Goodroe is 59 yo woman with CLL as well as AA/bone marrow failure. She failed Rituxan and ATG/CSA and then tacrolimus. Her blood counts improve somewhat in response to tacrolimus but only that is far from normal and far from being able to sustain future treatment for CLL. While the treatment modalities for CLL have not been exhausted, patient's marrow reserve could not support further CLL therapy.  CLL: The CT of C/A/P on 10/10/19 revealed no evidence of LAD or organomegaly. There was possible endometrial fluid or thickening in the fundus of the uterus.  The BMBx on 10/07/19 showed:   Scant bone marrow sampling (<5% cellular) essentially devoid of hematopoietic elements.  -  Persistent chronic lymphocytic leukemia, representing 23% of marrow cellularity by flow cytometric analysis       -  Routine cytogenetic analysis is pending.   - Corresponding CBC: WBC 1.0, ANC 0.1.     12/26/19: BMBx:   -  Hypocellular bone marrow (20% overall) involved by chronic lymphocytic leukemia/small lymphocytic lymphoma, representing approximately 20% of marrow cells by PAX5 immunohistochemistry  - Normal karyotype: 46,XX[20]    BMT:??SD (stable disease)  HCT-CI (age adjusted) 4??(active CLL, psych, age adjusted)  Conditioning:  1. Rituximab 375 mg/m2 on day -13 and 1000 mg/m2 on D-6, +1, and +8  2. Bendamustine 130 mg/m2 IV daily over 10 minutes on D-5 through -3  3. Fludarabine 30 mg/m2 IV daily over 30 minutes on D-5 through -3    Donor: 10/10, ABO ), CMV negative  Engraftment: Granix starting D+12 through neutrophil recovery (as defined as ANC 1.0 x 2 days or 3.0 x 1 day)  - Viral studies showed weakly positive EBV, CMV, HHV-6, plan to monitor for now  - BMBx results showed hypocellular marrow without evidence of hematopiesis. Chimerism studies are still pending  GvHD prophylaxis:   1. Tacrolimus 0.045 mg/kg PO BID starting D-3 followed by 0.03 mg/kg PO BID starting on D-1 with a goal of 5-10 ng/mL  ??** Current dose 1/0.5mg  BID  2. Methotrexate 5 mg/m2 on D+1, +3, +6 and +11  3. rATG 1 mg/kg on D-2 and -1 included    Heme:??On Granix. Pt s/p 1unit plts 02/29/2020 for a plt count of 6 that increased to 15.  Hgb 6.4 for which she received 2units PRBC's on 02/29/2020 w/ improvement up to 8.2.    - Transfusion criteria: Transfuse 1 unit of PRBCs for Hgb<7 and 1 unit of platelets for Plt <10K or bleeding. No history of transfusion reactions.   - 8/9 Granix, 8/10 ANC 1.2    ID:??  Neutropenic fever  Fever at home 100.4 and low grade in ER of 99.5.  Continue cefepime, no clinical indication to cont vanc at this time.  No s/s of sepsis.  CXR/Urinalysis clear, await blood/urine cx's.  Remains afebrile, VSS.  -Blood cultures NGTD at 24h  -Urine culture no growth  -HHV6 pending  -CMV pending    Prophylaxis:  - Antiviral: Continue Valtrex 500 mg po daily on admit  - Antifungal: Posaconazole 300 mg daily  - Antibacterial: Discontinue cefepime 8/10. Responded well to granix, with ANC levels bumping to >1. For discharge, consider resuming prophylactic cefdinir 300 BID vs. periodic Granix, given ANC response.   Cefdinir 300 BID for prophy. May be able to discontinue once ANC reaches 1,000 consistently.   ** Hx of nausea with levofloxacin when given with CNI and higher dose of Trintellix, so unclear which agent caused nausea. Patient ok with re-challenging with levofloxacin with plan to switch to cefdinir if she develops significant nausea while on levofloxacin  - PJP: Bactrim DS once daily MWF upon platelet engraftment. ??S/p pentamidine on 02/21/20. ??    CMV:  - 02/24/20: CMV 207  -8/10 CMV pending    HHV-6:  - Positive PCR on 7/15 with 40.6 cycles  - Repeat PCR showed increased cycles at 41.2  -8/10 HHV6 pending  EBV: negative    GI:??Pepcid for GERD prophylaxis    CINV:  - Anti-emetics per BMT protocol.   - Zofran works well for her  - Nightly zyprexa    Diarrhea: Resolved.   Constipation:??Miralax PRN and would add scheduled colace 100mg  bid should she begin to have issues with constipation but no c/o this at this time    Renal:   -??Cr??0.8 ??baseline of ~ 0.7    FEN: ??Electrolyte replacement per protocol  - Continue folic acid.  - Received some IVF's as flushes after antbx but no other.  Appears euvolemic on exam    Endocrine:  Hypothyroid:??  - Continue Synthroid daily.   - Check TSH, T3, T4    Hepatic:??  - VOD prophylaxis not warranted as non-myeloablative regimen.   ??  Hepatocellular liver injury pattern:  - Mild increase in AST, ALT, and AlkPhos, likely secondary to posaconazole  - Follow up repeat viral studies    CV:  HTN: currently not on treatment.    Pulm:  AHRF: First noted on 7/22. Intermittent O2 requirement with exam findings concerning for LLL crackles. No signs of volume overload on exam.  - Chest-X ray clear  - RA currently with saturation >92%    Neuro/Pain:??  - No c/o at this time    Psych:??Has a home psychiatrist.   Depression/Anxiety:   - Per outpt note pt was taking Xanax 0.25 mg BID PRN for anxiety but this was not on her home med list thus I did not re-order at this time. Discussed with patient who stated she rarely takes.  - Continue Lithium 300mg  nightly  - Trintellix 10mg  nightly  - Psych consult PRN.     Insomnia:  - Ambien 10mg  nightly ordered    Caregiver/Lodging:  - Husband [name] will be primary caregiver  - Plan to stay in local rental post transplant??    Summary: Ms. Brahmbhatt was admitted for neutropenic fever. She remains afebrile inpatient, no growth on urine or blood cultures, no indications of infection at this time. CMV and HHV6 pending, given her prior history of positive values. Cefepime discontinued, plan to discharge 8/11 if afebrile and asymptomatic overnight.      Jasmine Pang. Aurther Loft, MSN, APRN, AGNP-C  [facility]    I personally spent 30 minutes face-to-face and non-face-to-face in the care of this patient, which includes all pre, intra, and post visit time on the date of service.

## 2020-03-02 NOTE — Unmapped (Signed)
Tacrolimus Therapeutic Monitoring Pharmacy Note  ??  Susan Kane is a 59 y.o. female continuing tacrolimus. She is Day +48 of her NMA benda/flu/ritux alloSCT. She has a history of AKI on tacrolimus.  ??  Indication: GVHD prophylaxis post allogeneic BMT (but on prior to txp for severe aplastic anemia)   ??  Date of Transplant: 01/14/20      Prior Dosing Information: tacrolimus 1.5 mg PO BID  ??  Goals:  Therapeutic Drug Levels  Tacrolimus trough goal: 5-10 ng/mL  ??  Additional Clinical Monitoring/Outcomes  ?? Monitor renal function (SCr and urine output) and liver function (LFTs)  ?? Monitor for signs/symptoms of adverse events (e.g., hyperglycemia, hyperkalemia, hypomagnesemia, hypertension, headache, tremor)  ??  Results:   Tacrolimus level: 7.8 ng/mL, drawn appropriately  ??  Pharmacokinetic Considerations and Significant Drug Interactions:  ?? Concurrent hepatotoxic medications: Posaconazole  ?? Concurrent CYP3A4 substrates/inhibitors: Posaconazole  ?? Concurrent nephrotoxic medications: None identified  ??  Assessment/Plan:  Recommendation(s)  ?? Tacrolimus trough is therapeutic and at steady state levels  ?? Renal function currently stable/WNL; LFTs stable but slightly elevated; bili WNL  ?? Continue with current dose of 1.5 mg BID  ??  Follow-up  ?? Next level has been ordered on 8/12 at 0800  ?? A pharmacist will continue to monitor and recommend levels as appropriate  ??  Longitudinal Dose Monitoring:  Date Dose (mg), Route AM Scr (mg/dL) Trough  (ng/mL) Key Drug Interactions   7/23 1.5/1.5 0.91  Posaconazole   7/24 1.5/1.5 0.82  Posaconazole   7/25 1.5/1.5 0.77  Posaconazole   7/26 1.5/1.5 0.87 9.1 Posaconazole   7/27 1.5/1.5 0.78  Posaconazole   7/28 1.5/1.5 0.80 7.8 Posaconazole   7/29 1.5/1.5 0.75  Posaconazole   7/30 1.5/1.5 0.83  Posaconazole   7/31 1.5/1.5 0.83 6.5 Posaconazole   8/1 1.5/1.5   Posaconazole   8/2 1.5/1.5 0.74 7.6 Posaconazole   8/3 1.5/1.5 0.86 8.0 Posaconazole   8/4 1.5/1.5   Posaconazole   8/5 1.5/1.5   Posaconazole   8/6 1.5/1.5 0.93 6.2 Posaconazole   8/7 1.5/1.5   Posaconazole   8/8 1.5/1.5 0.81  Posaconazole   8/9 1.5/1.5   Posaconazole   8/10 1.5/1.5 0.8 7.8 Posaconazole   ??  Please page service pharmacist with questions/clarifications.  ??  Lonna Cobb, PharmD, BCPS, BCOP [phone]

## 2020-03-02 NOTE — Unmapped (Signed)
Day + 48. Vitals signs: remained WNL this shift. Pain: denies. Nausea/vomitting: reported nausea at beginning of shift, one time compazine given with relief. Diarrhea: denies. Blood/electrolyte replacements: K+ given for serum level 3.6, 2G mag given for serum level 1.3  Other: Pt appeared fatigued. Tolerated scheduled medications, no falls or injuries, neutropenic precautions maintained    Problem: Adult Inpatient Plan of Care  Goal: Plan of Care Review  Outcome: Ongoing - Unchanged  Goal: Patient-Specific Goal (Individualized)  Outcome: Ongoing - Unchanged  Goal: Absence of Hospital-Acquired Illness or Injury  Outcome: Ongoing - Unchanged  Intervention: Identify and Manage Fall Risk  Recent Flowsheet Documentation  Taken 03/01/2020 1925 by Ervin Knack, RN  Safety Interventions:   fall reduction program maintained   family at bedside   lighting adjusted for tasks/safety   low bed   neutropenic precautions   isolation precautions   environmental modification   bleeding precautions  Intervention: Prevent Infection  Recent Flowsheet Documentation  Taken 03/01/2020 1925 by Ervin Knack, RN  Infection Prevention:   cohorting utilized   environmental surveillance performed   equipment surfaces disinfected   hand hygiene promoted   personal protective equipment utilized   rest/sleep promoted   single patient room provided   visitors restricted/screened  Goal: Optimal Comfort and Wellbeing  Outcome: Ongoing - Unchanged  Goal: Readiness for Transition of Care  Outcome: Ongoing - Unchanged  Goal: Rounds/Family Conference  Outcome: Ongoing - Unchanged     Problem: Infection  Goal: Absence of Infection Signs and Symptoms  Outcome: Ongoing - Unchanged  Intervention: Prevent or Manage Infection  Recent Flowsheet Documentation  Taken 03/01/2020 1925 by Ervin Knack, RN  Infection Management: aseptic technique maintained  Isolation Precautions: protective precautions maintained

## 2020-03-02 NOTE — Unmapped (Signed)
Afebrile VSS,  IV Abx administered without incident.    Vortioxetine identified and labeled by pharmacy resident and placed in cubbie.  All other home meds brought to car by family member per Pt report.  Awaiting BC results for discharge planning.  No other issues this shift.    Problem: Adult Inpatient Plan of Care  Goal: Plan of Care Review  Outcome: Progressing  Goal: Patient-Specific Goal (Individualized)  Outcome: Progressing  Goal: Absence of Hospital-Acquired Illness or Injury  Outcome: Progressing  Intervention: Identify and Manage Fall Risk  Recent Flowsheet Documentation  Taken 03/01/2020 0832 by Wardell Heath, RN BSN  Safety Interventions: low bed  Intervention: Prevent Infection  Recent Flowsheet Documentation  Taken 03/01/2020 1610 by Wardell Heath, RN BSN  Infection Prevention: cohorting utilized  Goal: Optimal Comfort and Wellbeing  Outcome: Progressing  Goal: Readiness for Transition of Care  Outcome: Progressing  Goal: Rounds/Family Conference  Outcome: Progressing     Problem: Infection  Goal: Absence of Infection Signs and Symptoms  Outcome: Progressing  Intervention: Prevent or Manage Infection  Recent Flowsheet Documentation  Taken 03/01/2020 9604 by Wardell Heath, RN BSN  Infection Management: aseptic technique maintained  Isolation Precautions: protective precautions maintained

## 2020-03-03 LAB — BASIC METABOLIC PANEL
BLOOD UREA NITROGEN: 14 mg/dL (ref 9–23)
BUN / CREAT RATIO: 17
CALCIUM: 9.2 mg/dL (ref 8.7–10.4)
CHLORIDE: 108 mmol/L — ABNORMAL HIGH (ref 98–107)
EGFR CKD-EPI AA FEMALE: 90 mL/min/{1.73_m2} (ref >=60)
EGFR CKD-EPI NON-AA FEMALE: 78 mL/min/{1.73_m2} (ref >=60)
GLUCOSE RANDOM: 229 mg/dL — ABNORMAL HIGH (ref 70–179)
POTASSIUM: 3.8 mmol/L (ref 3.4–4.5)
SODIUM: 139 mmol/L (ref 135–145)

## 2020-03-03 LAB — CBC W/ AUTO DIFF
BASOPHILS ABSOLUTE COUNT: 0 10*9/L (ref 0.0–0.1)
BASOPHILS RELATIVE PERCENT: 0.6 %
EOSINOPHILS ABSOLUTE COUNT: 0 10*9/L (ref 0.0–0.4)
EOSINOPHILS RELATIVE PERCENT: 0.1 %
HEMATOCRIT: 22.3 % — ABNORMAL LOW (ref 36.0–46.0)
HEMOGLOBIN: 8 g/dL — ABNORMAL LOW (ref 12.0–16.0)
LARGE UNSTAINED CELLS: 8 % — ABNORMAL HIGH (ref 0–4)
LYMPHOCYTES ABSOLUTE COUNT: 0.2 10*9/L — ABNORMAL LOW (ref 1.5–5.0)
LYMPHOCYTES RELATIVE PERCENT: 14.4 %
MEAN CORPUSCULAR HEMOGLOBIN CONC: 35.8 g/dL (ref 31.0–37.0)
MEAN CORPUSCULAR HEMOGLOBIN: 28.3 pg (ref 26.0–34.0)
MEAN CORPUSCULAR VOLUME: 79 fL — ABNORMAL LOW (ref 80.0–100.0)
MEAN PLATELET VOLUME: 12.1 fL — ABNORMAL HIGH (ref 7.0–10.0)
MONOCYTES ABSOLUTE COUNT: 0.1 10*9/L — ABNORMAL LOW (ref 0.2–0.8)
NEUTROPHILS ABSOLUTE COUNT: 0.8 10*9/L — ABNORMAL LOW (ref 2.0–7.5)
NEUTROPHILS RELATIVE PERCENT: 65.8 %
PLATELET COUNT: 9 10*9/L — CL (ref 150–440)
RED BLOOD CELL COUNT: 2.83 10*12/L — ABNORMAL LOW (ref 4.00–5.20)
RED CELL DISTRIBUTION WIDTH: 14.2 % (ref 12.0–15.0)

## 2020-03-03 LAB — CO2: Carbon dioxide:SCnc:Pt:Ser/Plas:Qn:: 25

## 2020-03-03 LAB — HLA MATCHED PLATELET ANTIBODY SCREEN: HLA CLASS 1 ANTIBODY RESULT: NEGATIVE

## 2020-03-03 LAB — LYMPHOCYTES ABSOLUTE COUNT: Lymphocytes:NCnc:Pt:Bld:Qn:Automated count: 0.2 — ABNORMAL LOW

## 2020-03-03 LAB — CPRA%: Lab: 0

## 2020-03-03 LAB — PLATELET COUNT: Platelets:NCnc:Pt:Bld:Qn:Automated count: 17 — ABNORMAL LOW

## 2020-03-03 LAB — MAGNESIUM: Magnesium:MCnc:Pt:Ser/Plas:Qn:: 1.6

## 2020-03-03 MED ORDER — VALGANCICLOVIR 450 MG TABLET
ORAL_TABLET | Freq: Two times a day (BID) | ORAL | 0 refills | 30.00000 days | Status: CP
Start: 2020-03-03 — End: ?
  Filled 2020-03-03: qty 120, 30d supply, fill #0

## 2020-03-03 MED ADMIN — heparin, porcine (PF) 100 unit/mL injection 2 mL: 2 mL | INTRAVENOUS | @ 12:00:00 | Stop: 2020-03-03

## 2020-03-03 MED ADMIN — posaconazole (NOXAFIL) delayed released tablet 300 mg: 300 mg | ORAL | @ 12:00:00 | Stop: 2020-03-03

## 2020-03-03 MED ADMIN — tbo-filgrastim (GRANIX) injection 300 mcg: 300 ug | SUBCUTANEOUS | @ 16:00:00 | Stop: 2020-03-03

## 2020-03-03 MED ADMIN — famotidine (PEPCID) tablet 20 mg: 20 mg | ORAL | @ 01:00:00

## 2020-03-03 MED ADMIN — ondansetron (ZOFRAN-ODT) disintegrating tablet 8 mg: 8 mg | ORAL | @ 12:00:00 | Stop: 2020-03-03

## 2020-03-03 MED ADMIN — famotidine (PEPCID) tablet 20 mg: 20 mg | ORAL | @ 12:00:00 | Stop: 2020-03-03

## 2020-03-03 MED ADMIN — folic acid (FOLVITE) tablet 1 mg: 1 mg | ORAL | @ 01:00:00

## 2020-03-03 MED ADMIN — zolpidem (AMBIEN) tablet 10 mg: 10 mg | ORAL | @ 04:00:00 | Stop: 2020-03-03

## 2020-03-03 MED ADMIN — levothyroxine (SYNTHROID) tablet 100 mcg: 100 ug | ORAL | @ 10:00:00 | Stop: 2020-03-03

## 2020-03-03 MED ADMIN — valACYclovir (VALTREX) tablet 500 mg: 500 mg | ORAL | @ 12:00:00 | Stop: 2020-03-03

## 2020-03-03 MED FILL — VALGANCICLOVIR 450 MG TABLET: 30 days supply | Qty: 120 | Fill #0 | Status: AC

## 2020-03-03 MED FILL — LITHIUM CARBONATE ER 300 MG TABLET,EXTENDED RELEASE: 30 days supply | Qty: 30 | Fill #0 | Status: AC

## 2020-03-03 NOTE — Unmapped (Signed)
Date: 03/03/2020    [facility] Home Infusion Nurse Coordinator Note: Kirstie Mirza, MSN, RN    RN Liaison assessment and referral coordination completed using precautionary measures in accordance with [facility] COVID-19 Pandemic emergency response plan. Patient verbalized understanding and agreement with completing this assessment via telephone or in person visit.    Patient: Susan Kane  MRN: [id number]  EDD: 03/03/2020    Address and Demographics Verified:    [address]  [place], [place] [address]    Patient Cell: [phone]  Husband: Alean Rinne [phone]  Daughter: Trinna Post [phone]     DRUG: [facility]    CVAD LINE: TL Power    Date of Medication Delivery: Day of Discharge  Other Clinical needs:   A1C:   Home Health Agency Seeing Patient: NA ??? Returning to Clinic  MD Following for [facility]ng[facility]ing [facility] Orders:   Hospital Dosing: Yes prior to D/C   Therapeutic Drug level:     Clinical Nurse Liaison Visit: Liaison spoke to patient for verification of demographics/contact information. Patient informed & made aware of Home Infusion/Home Health services, responsibilities and agreed to discharge plan of care for HH/Infusion Therapy. Patient is agreeable and open for IV therapy, and is comfortable with the infusion process and infusion device. No barriers for doing home infusion identified. Has good family support for care at home. Discussed overview of the infusion process, and introduced the type of device used in the home.     Reviewed troubleshooting tips for device and line management. Instructed to keep CVAD dressing clean, dry and intact at home. Reinforced proper care of the access device and any activity limitations.     Discussed precautions for preventing infection and other complications including: aseptic technique and hand hygiene; signs and symptoms of complications to report; who to contact for questions or nursing needs.     Reviewed supplies will arrive carrier services in a brown box. Inspect your supplies when they arrive. You will be asked to return a signed copy of the delivery ticket. Most medications sent to your home will need to be refrigerated (check label for clarification). Your Home Health Nurse will provide further education on the proper medication storage and disposal.     RN Liaison educated patient/caregiver regarding COVID-19 exposure avoidance including of importance of personal and family hygiene with frequent hand washing for 20 seconds with soap and water, avoidance of exposure to people who are sick, avoid crowds, and maintain social distancing, use proper respiratory hygiene including sneeze/cough into elbow. Wear a mask when you will be in public.     If caregivers are coming into your home to help with your care, instruct them to wear a mask and gloves. Further education provided that if patient demonstrates respiratory illness and fever, they should quarantine to one area of the home and contact their physician or this [facility] agency for additional instructions.     Follow-up Questions regarding travel outside the [place] within the last 30days: NO to all questions     - Have you traveled in the past 14 days?   - Do you have fever and/or symptoms of respiratory illness? (cough, difficulty breathing)  - Have you traveled from [place] ([place] or [address]), [place], [place], [place], [place], or [place] in the last 14 days before symptoms began?  - Have you had close contact with a person with confirmed or suspected novel Corona Virus in the last 14 days before symptoms began?  - Do you have  any of the following symptoms that are new or worsening: cough, congestion, and shortness of breath, loss of taste/smell, sore throat, fever or feeling feverish, chills, muscle pain, headache, nausea, vomiting, or diarrhea?  - Have you had close contact with a person with confirmed COVID-19 in the last 21 days before symptoms began?  - Have you tested positive in the last 21 days for COVID-19    Offer to Counsel: Do you have any questions for the Pharmacist regarding:  No  Gave patient contact numbers, and educated to call [facility] at [phone] for infusion issues and Home Health Agency for nursing questions. For life-threatening event, instructed to call 911.     Patient and Caregiver Questions: All questions were addressed or answered by Liaison. Patient/care provider had no further questions.    Information Updates Provided to: [facility] CM, [facility].    Reviewed plan: Patient verbalized understanding.

## 2020-03-03 NOTE — Unmapped (Signed)
BMTCTP Discharge Summary    Admit date: 02/29/2020  Discharge date: 03/03/20  Discharge to: Local Apartment  Discharge Service: MDT    Procedures: None  Discharge diagnosis/complications: CMV viremia, thrombocytopenia    BMT MD:  Lanae Boast, MD    Referring Oncologist: Dr. Jillyn Hidden [facility]) [name] [phone]  Date: 03/02/20  ??  Disease: CLL  Current disease status: SD (stable disease)  Type of Transplant:??RIC MUD Allo  Graft Source: Cryopreserved PBSCs  Transplant Day:??D+49??(03/03/20)  ??  Study Participant: BMT CTN 1704: Composite Health Assessment Risk Model for Older Adults: Applying Pre-transplant Comorbidity, Geriatric Assessment and Biomarkers to Predict Non-Relapse Mortality After Allogeneic Transplant (CHARM).  ??  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  She was discharged from hospital on 02/23/2020.  Please this note for details regarding her admission.   She remains on eltrobopag and Granix.   ??  Mrs. Mesina was was seen in clinic on 02/27/20 with plan to have plt transfusion and f.u in infusion clinic on 02/29/20 for possible plts and blood.  No med changes were made during this visit.  The patient called into on call provider w/ c/o a fever of 100.3 by which she was asked to recheck in an hour.  Her repeat temp was 100.5 however, the patient had apparently eaten some hot rice so there was a questionable discrepancy.  She waited about67min then retook and her temp was 100.4 for which she was advised to come to ER for further work up.  The patient's daughter who is at bedside stated the temp was vacillating from 99-100.9 so she is not so sure that there isn't an issue with the thermometer despite it being new.  Pt stated she has had difficulty with nausea while taking meds thus she attempted to divide up some of her daily dosed meds and take some in AM and some in PM.  She stated she has been taking zofran scheduled and zyprexa as well.  Stated her taste is altered but has been consuming 3 small meals/day and drinks a variety of fluids totaling probably 1.5liters/day.  Denied myalgia's, chills, URI/UTI s/s, exposure to sick/covid contacts, diarrhea.    ??  In the ER the pt's temp was 99.5, bp 107/70, pulse 91 and RA w/out s/s of hypoxia and saturation of 99%.  She was pan cultured, UA with small blood, rare bacteria and neg leuk's, 2 WBC's.  CXR w/out overt consolidation and clear.  Pt given a dose of vanc w/out indication clinically to continue and cefepime.      Interval History:   Today Ms. Cantin is feeling well. She has been afebrile since admission, no signs or symptoms of infection. Her cefepime was stopped 03/02/20 and she remained afebrile. Blood cultures are no growth at 48 hours, urine culture negative, hhv6 negative. Her CMV is positive, and has risen from 299 on 8/6 to 884 on 8/10. Will plan to start valganciclovir outpatient to treat CMV, will need to monitor for low blood counts while on this medication. She will return to clinic Friday, 8/13.    ROS:  Comprehensive ROS negative except pertinent positives listed in interval history.     Physical exam:  Vitals:    03/03/20 0729   BP: 112/67   Pulse: 81   Resp: 20   Temp: 37.1 ??C (98.8 ??F)   SpO2: 98%     Vitals:  03/01/20 2025 03/02/20 2030   Weight: 55.6 kg (122 lb 8 oz) 55.4 kg (122 lb 3.2 oz)       KPS at discharge: 90,  Able to carry on normal activity; minor signs or symptoms of disease (ECOG equivalent 0)    General : No acute distress noted.   Central venous access: Line clean, dry, intact. No erythema or drainage noted.   ENT: Moist mucous membranes. Oropharhynx without lesions, erythema or exudate.   Cardiovascular: Pulse normal rate, regularity and rhythm. S1 and S2 normal, without any murmur, rub, or gallop.  Lungs: Clear to auscultation bilateraly, without wheezes/crackles/rhonchi. Good air movement.   Skin: Warm, dry, intact. No rash noted.   Psychiatry: Alert and oriented to person, place, and time.   GI: Normoactive bowel sounds, abdomen soft, non-tender   Extremeties: No edema.   Musculoskeletal/Extremities: Full range of motion in shoulder, elbow, hip knee, ankle, left hand and feet.  Neurologic: CNII-XII intact. Normal strength and sensation throughout    Lab Results   Component Value Date    WBC 1.1 (L) 03/03/2020    HGB 8.0 (L) 03/03/2020    HCT 22.3 (L) 03/03/2020    PLT 17 (L) 03/03/2020     Lab Results   Component Value Date    NA 139 03/03/2020    K 3.8 03/03/2020    CL 108 (H) 03/03/2020    CO2 25.0 03/03/2020    BUN 14 03/03/2020    CREATININE 0.82 (H) 03/03/2020    GLU 229 (H) 03/03/2020    CALCIUM 9.2 03/03/2020    MG 1.6 03/03/2020    PHOS 3.6 03/01/2020     Lab Results   Component Value Date    BILITOT 0.8 03/01/2020    BILIDIR 0.30 03/01/2020    PROT 5.9 03/01/2020    ALBUMIN 3.5 03/01/2020    ALT 71 (H) 03/01/2020    AST 45 (H) 03/01/2020    ALKPHOS 127 (H) 03/01/2020    GGT 22 01/09/2020     Lab Results   Component Value Date    PT 18.8 (H) 03/01/2020    PT 18.7 (H) 03/01/2020    INR 1.62 03/01/2020    INR 1.61 03/01/2020    APTT 31.9 03/01/2020    APTT 32.7 03/01/2020     Hospital Course/Assessment and Plan  Mrs. Oshima is 59 yo woman with CLL as well as AA/bone marrow failure. She failed Rituxan and ATG/CSA and then tacrolimus. Her blood counts improve somewhat in response to tacrolimus but only that is far from normal and far from being able to sustain future treatment for CLL. While the treatment modalities for CLL have not been exhausted, patient's marrow reserve could not support further CLL therapy.   ??  CLL: The CT of C/A/P on 10/10/19 revealed no evidence of LAD or organomegaly. There was possible endometrial fluid or thickening in the fundus of the uterus.  The BMBx on 10/07/19 showed:   Scant bone marrow sampling (<5% cellular) essentially devoid of hematopoietic elements.  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  - ??Routine cytogenetic analysis is pending.   - Corresponding CBC: WBC 1.0, ANC 0.1.   ??  12/26/19: BMBx:   - ??Hypocellular bone marrow (20% overall) involved by chronic lymphocytic leukemia/small lymphocytic lymphoma, representing approximately 20% of marrow cells by PAX5 immunohistochemistry  - Normal karyotype: 46,XX[20]  ??  BMT:??SD (stable disease)  HCT-CI (age adjusted) 5??(active CLL, psych,  age adjusted)  Conditioning:  1. Rituximab 375 mg/m2 on day -13 and 1000 mg/m2 on D-6, +1, and +8  2. Bendamustine 130 mg/m2 IV daily over 10 minutes on D-5 through -3  3. Fludarabine 30 mg/m2 IV daily over 30 minutes on D-5 through -3  ??  Donor: 10/10, ABO ), CMV negative  Engraftment: Granix starting D+12 through neutrophil recovery (as defined as ANC 1.0 x 2 days or 3.0 x 1 day)  - Viral studies showed weakly positive EBV, CMV, HHV-6, plan to monitor for now  - BMBx results showed hypocellular marrow without evidence of hematopiesis. Chimerism studies are still pending  GvHD prophylaxis:   1. Tacrolimus 0.045 mg/kg PO BID starting D-3 followed by 0.03 mg/kg PO BID starting on D-1 with a goal of 5-10 ng/mL  ??** Current dose 1/0.5mg  BID  2. Methotrexate 5 mg/m2 on D+1, +3, +6 and +11  3. rATG 1 mg/kg on D-2 and -1 included  ??  Heme:??On Granix. Pt s/p 1unit plts 02/29/2020 for a plt count of 6 that increased to 15.  Hgb 6.4 for which she received 2units PRBC's on 02/29/2020 w/ improvement up to 8.2.  ??  - Transfusion criteria: Transfuse 1 unit of PRBCs for Hgb<7 and 1 unit of platelets for Plt <10K or bleeding. No history of transfusion reactions.   - Granix given 8/9, 8/11    ID:??  Neutropenic fever  03/02/20 Fever at home 100.4 and low grade in ER of 99.5.  Continue cefepime, no clinical indication to cont vanc at this time.  No s/s of sepsis.  CXR/Urinalysis clear, await blood/urine cx's.  Remains afebrile, VSS.  -Blood cultures NGTD at 48h  -Urine culture no growth  -HHV6 negative  -CMV 884  ??  Prophylaxis:  - Antiviral: Discontinue Valtrex, start valganciclovir given CMV +  - Antifungal: Posaconazole 300 mg daily  - Antibacterial: ANC remains >0.5. Recheck ANC at clinic visit 8/13, may need to restart cefdinir BID if neutropenic. May be able to discontinue once ANC reaches 1,000 consistently.   ** Hx of nausea with levofloxacin when given with CNI and higher dose of Trintellix, so unclear which agent caused nausea. Patient ok with re-challenging with levofloxacin with plan to switch to cefdinir if she develops significant nausea while on levofloxacin  - PJP: Bactrim DS once daily MWF upon platelet engraftment. ??S/p pentamidine on 02/21/20. ??  ??  CMV:  - 02/24/20: CMV 207  - 8/10 CMV 884. Valganciclovir started.  ??  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  EBV: negative  ??  GI:??Pepcid for GERD prophylaxis  ??  CINV:  - Anti-emetics per BMT protocol.   - Zofran works well for her  - Nightly zyprexa  ??  Diarrhea: Resolved.   Constipation:??Miralax PRN and would add scheduled colace 100mg  bid should she begin to have issues with constipation but no c/o this at this time  ??  Renal:   -??Cr??0.8 ??baseline of ~ 0.7  ??  FEN: ??Electrolyte replacement per protocol  - Continue folic acid.  - Received some IVF's as flushes after antbx but no other.  Appears euvolemic on exam  ??  Endocrine:  Hypothyroid:??  - Continue Synthroid daily.   - Check TSH, T3, T4  ??  Hepatic:??  - VOD prophylaxis not warranted as non-myeloablative regimen.   ??  Hepatocellular liver injury pattern:  -  Mild increase in AST, ALT, and AlkPhos, likely secondary to posaconazole  ??  CV:  HTN: currently not on treatment.  ??  Pulm:  AHRF: First noted on 7/22. Intermittent O2 requirement with exam findings concerning for LLL crackles. No signs of volume overload on exam.  - Chest-X ray clear  - RA currently with saturation >92%    Neuro/Pain:??  - No c/o at this time    Psych:??Has a home psychiatrist.   Depression/Anxiety:   - Per outpt note pt was taking Xanax 0.25 mg BID PRN for anxiety but this was not on her home med list thus I did not re-order at this time. Discussed with patient who stated she rarely takes.  - Continue Lithium 300mg  nightly  - Trintellix 10mg  nightly  - Psych consult PRN.   ??  Insomnia:  - Ambien 10mg  nightly ordered  ??  Caregiver/Lodging:  - Husband [name] will be primary caregiver  - Plan to stay in local rental post transplant??    Labs for first clinic visit entered: Yes    Condition at Discharge: good    I spent greater than 30 minutes in the discharge of this patient.    Jasmine Pang. Orpah Greek  [facility] Bone Marrow Transplant and Cellular Therapy Progam    Park Breed, PA   [facility] Bone Marrow Transplant and Cellular Therapy Progam  Discharge Medications:      Your Medication List      STOP taking these medications    cefdinir 300 MG capsule  Commonly known as: OMNICEF     valACYclovir 500 MG tablet  Commonly known as: VALTREX        START taking these medications    letermovir 480 mg tablet  Commonly known as: PREVYMIS  Take 1 tablet (480 mg total) by mouth daily.     valGANciclovir 450 mg tablet  Commonly known as: VALCYTE  Take 2 tablets (900 mg total) by mouth Two (2) times a day.        CONTINUE taking these medications    eltrombopag 50 MG tablet  Commonly known as: PROMACTA  Take 2 tablets (100 mg total) by mouth daily. Administer on an empty stomach, 1 hour before or 2 hours after a meal.     famotidine 20 MG tablet  Commonly known as: PEPCID  Take 1 tablet (20 mg total) by mouth Two (2) times a day.     folic acid 1 MG tablet  Commonly known as: FOLVITE  Take 1 tablet (1 mg total) by mouth at bedtime.     levothyroxine 100 MCG tablet  Commonly known as: SYNTHROID  Take 1 tablet (100 mcg total) by mouth daily.     lithium 300 MG ER tablet  Commonly known as: LITHOBID  Take 1 tablet (300 mg total) by mouth at bedtime.     magnesium (amino acid chelate) 133 mg Tab  Generic drug: magnesium oxide-Mg AA chelate  Take 1 tablet by mouth Two (2) times a day.     OLANZapine zydis 5 MG disintegrating tablet  Commonly known as: ZyPREXA  Take 1 tablet (5 mg total) by mouth nightly.     ondansetron 8 MG disintegrating tablet  Commonly known as: ZOFRAN-ODT  Dissolve  1 tablet (8 mg total) by mouth every eight (8) hours as needed for nausea.     posaconazole 100 mg Tbec delayed released tablet  Commonly known as: NOXAFIL  Take 3 tablets (300 mg total) by mouth daily.  tacrolimus 0.5 MG capsule  Commonly known as: PROGRAF  Take 3 capsules (1.5 mg total) by mouth two (2) times a day.     TRINTELLIX 10 mg tablet  Generic drug: vortioxetine  Take 1 tablet (10 mg total) by mouth at bedtime.     zolpidem 10 mg tablet  Commonly known as: AMBIEN  Take 1 tablet (10 mg total) by mouth nightly as needed for sleep.            Pending Test Results:   Pending Labs     Order Current Status    Blood Culture Preliminary result    Blood Culture Preliminary result          Discharge Instructions:     Other Instructions     Discharge instructions      Begin Valcyte to treat the virus CMV.  This is 900mg  twice a day.  This medication unfortunately also decreases your blood counts so we may need to continue platelet transfusions for a bit.     You will stop the Valtrex since the Valcyte will cover the viral prophylaxis for now.      We will see you in clinic on Friday and you will likely need platelet transfusion and Granix again.         Begin Valcyte to treat the virus CMV.  This is 900mg  twice a day.  This medication unfortunately also decreases your blood counts so we may need to continue platelet transfusions for a bit.     You will stop the Valtrex since the Valcyte will cover the viral prophylaxis for now.      We will see you in clinic on Friday and you will likely need platelet transfusion and Granix again.        Follow Up instructions and Outpatient Referrals     Discharge instructions      Begin Valcyte to treat the virus CMV.  This is 900mg  twice a day.  This medication unfortunately also decreases your blood counts so we may need to continue platelet transfusions for a bit.     You will stop the Valtrex since the Valcyte will cover the viral prophylaxis for now.      We will see you in clinic on Friday and you will likely need platelet transfusion and Granix again.    Referral to Home Infusion      Performing location?: [facility] Requested Disciplines: Other (see comments) Comment - n/a     **Please contact your service pharmacist for assistance with discharge home health infusion monitoring.      Home Health/Home Infusion Orders:   ??  Agency Details:   Infusion Agency: [facility] [phone], fax: [phone]    ??  CVAD Dressing Change:  Please change dressing weekly/prn using sterile CVAD kit/transparent dressing.   Please change Biopatch with CVAD dressing change.   Change the stabilization device (STATLOCK) as applicable with each dressing change.  Please change needleless cap at least weekly with dressing change and prn, and after every blood draw, using aseptic technique.  ??  Note - Generic substitutions permissible by Home Infusion agencies.  ??  IV Access:  Line Type: Hickman                Lumens: Three  Diameter/Gauge: 12.5 Fr.  Site: Right internal jugular   Placement Date: 01/09/2020  ??  Special Instructions:   ??  Flush Orders:  CVAD: Broviac/Hickman/Apheresis Cath/[place]/Powerline Adult Patient  Broviac/Hickman/Apheresis Cath/Powerline Adult Patient:  Flush with 5 mLs of Normal Saline before and after each use and prn line maintenance. Use after blood draws. Flush with Heparin 100units/mL 2 mLs at the end of each use and prn for line maintenance. Please change needleless cap weekly and after blood draws, or prn using aseptic technique. Please flush unused lumen(s) with Heparin 100units/mL 2 mLs Monday-Wednesday-Friday.  ??  ??  LABS:  To be performed in clinic  ??  ??  Physician to Follow Patient's Care (the person listed here will be responsible for signing ongoing orders):   Name: Lanae Boast, MD    Phone: [phone]  Fax: [phone]        Appointments which have been scheduled for you    Mar 03, 2020 12:30 PM  (Arrive by 12:00 PM)  BMT LEVEL 240 with [employer] CHAIR 08  [place] ONCOLOGY INFUSION [place] [facility] REGION) [address]  [place] [address]  [phone]      Mar 05, 2020  9:30 AM  (Arrive by 9:00 AM)  XR DEXA BONE DENSITY SKELETAL with Doran Durand RM 1  IMG DEXA [facility]) [address]  [place] [address]  [phone]   No calcium supplements 24 hrs prior.     Mar 05, 2020 10:45 AM  (Arrive by 10:15 AM)  BMT LAB with Carnella Guadalajara  [facility] BMT [place] [facility]ION) [address]  [place] [address]  [phone]      Mar 05, 2020 11:15 AM  (Arrive by 10:45 AM)  RETURN FOLLOW UP [place] with ONCBMT APP B  [facility] BMT [place] [facility] REGION) [address]  [place] [address]  [phone]      Mar 05, 2020  1:00 PM  (Arrive by 12:30 PM)  BMT LEVEL 240 with [employer] CHAIR 07  [place] [facility]COLOG[facility] INFUSION [place] [facility] REGION) [address]  [place] [address]  [phone]      Mar 24, 2020 11:45 AM  (Arrive by 11:15 AM)  BMT LAB with Carnella Guadalajara  [facility] BMT [place] [facility] REGION) [address]  [place] [address]  [phone]      Mar 24, 2020 12:15 PM  (Arrive by 11:45 AM)  RETURN FOLLOW UP [place] with Artelia Laroche, MD  [facility] BMT [place] [facility] REGION) [address]  [place] [address]  [phone]      Mar 24, 2020  1:30 PM  (Arrive by 1:00 PM)  BMT LEVEL 240 with [employer] CHAIR 05  [place] ONCOLOGY INFUSION [place] [facility]ION) [address]  [place] [address]  [phone]      Apr 21, 2020 10:45 AM  (Arrive by 10:15 AM)  BMT LAB with Carnella Guadalajara  [facility] BMT [place] [facility] REGION) [address]  [place] [address]  [phone]      Apr 21, 2020 11:15 AM  (Arrive by 10:45 AM)  RETURN FOLLOW UP [place] with ONCBMT APP B  [facility] BMT  [facility] REGION) [address]  [place] [address]  [phone]      Apr 21, 2020 12:30 PM  (Arrive by 12:00 PM)  BONE MARROW BIOPSY with ONCBMT PROCEDURES  [place] ONCOLOGY INFUSION  [facility] REGION) [address]  [name] [address]  [phone]  Apr 27, 2020 10:15 AM  (Arrive by 9:45 AM)  BMT LAB with Carnella Guadalajara  [facility] BMT [place] [facility] REGION) [address]  [place] [address]  [phone]      Apr 27, 2020 10:45 AM  (Arrive by 10:15 AM)  RETURN FOLLOW UP [place] with Artelia Laroche, MD  [facility] BMT [place] [facility] REGION) [address]  [place] [address]  [phone]

## 2020-03-03 NOTE — Unmapped (Signed)
VSS and afebrile. PRN zofran given. Denies any pain. Pt receiving platelets, no signs of transfusion reaction noted/reported. Denies any other needs or concerns.     Home Med (Trintellix) given to pt and daughter to take home.   Problem: Adult Inpatient Plan of Care  Goal: Plan of Care Review  Outcome: Ongoing - Unchanged  Goal: Patient-Specific Goal (Individualized)  Outcome: Ongoing - Unchanged  Goal: Absence of Hospital-Acquired Illness or Injury  Outcome: Ongoing - Unchanged  Intervention: Identify and Manage Fall Risk  Recent Flowsheet Documentation  Taken 03/02/2020 2100 by Nancy Marus, RN  Safety Interventions:   low bed   lighting adjusted for tasks/safety   chemotherapeutic agent precautions   nonskid shoes/slippers when out of bed   neutropenic precautions  Goal: Optimal Comfort and Wellbeing  Outcome: Ongoing - Unchanged  Goal: Readiness for Transition of Care  Outcome: Ongoing - Unchanged  Goal: Rounds/Family Conference  Outcome: Ongoing - Unchanged

## 2020-03-03 NOTE — Unmapped (Signed)
Pt d/c complete.  Pt verbalized understanding of all medications, instructions and follow up appts.     Problem: Adult Inpatient Plan of Care  Goal: Plan of Care Review  Outcome: Discharged to Home  Goal: Patient-Specific Goal (Individualized)  Outcome: Discharged to Home  Goal: Absence of Hospital-Acquired Illness or Injury  Outcome: Discharged to Home  Intervention: Identify and Manage Fall Risk  Recent Flowsheet Documentation  Taken 03/03/2020 0729 by Wardell Heath, RN BSN  Safety Interventions: low bed  Intervention: Prevent Infection  Recent Flowsheet Documentation  Taken 03/03/2020 0729 by Wardell Heath, RN BSN  Infection Prevention: cohorting utilized  Goal: Optimal Comfort and Wellbeing  Outcome: Discharged to Home  Goal: Readiness for Transition of Care  Outcome: Discharged to Home  Goal: Rounds/Family Conference  Outcome: Discharged to Home     Problem: Infection  Goal: Absence of Infection Signs and Symptoms  Outcome: Discharged to Home  Intervention: Prevent or Manage Infection  Recent Flowsheet Documentation  Taken 03/03/2020 0729 by Wardell Heath, RN BSN  Infection Management: aseptic technique maintained  Isolation Precautions: protective precautions maintained

## 2020-03-03 NOTE — Unmapped (Signed)
VSS.  No growth in blood Cultures.  Plan for discharge tomorrow is afebrile overnight.  No issues this shift.    Problem: Adult Inpatient Plan of Care  Goal: Plan of Care Review  Outcome: Ongoing - Unchanged  Goal: Patient-Specific Goal (Individualized)  Outcome: Ongoing - Unchanged  Goal: Absence of Hospital-Acquired Illness or Injury  Outcome: Ongoing - Unchanged  Intervention: Identify and Manage Fall Risk  Recent Flowsheet Documentation  Taken 03/02/2020 0754 by Wardell Heath, RN BSN  Safety Interventions: low bed  Intervention: Prevent Infection  Recent Flowsheet Documentation  Taken 03/02/2020 0754 by Wardell Heath, RN BSN  Infection Prevention: cohorting utilized  Goal: Optimal Comfort and Wellbeing  Outcome: Ongoing - Unchanged  Goal: Readiness for Transition of Care  Outcome: Ongoing - Unchanged  Goal: Rounds/Family Conference  Outcome: Ongoing - Unchanged     Problem: Infection  Goal: Absence of Infection Signs and Symptoms  Outcome: Ongoing - Unchanged  Intervention: Prevent or Manage Infection  Recent Flowsheet Documentation  Taken 03/02/2020 0754 by Wardell Heath, RN BSN  Infection Management: aseptic technique maintained  Isolation Precautions: protective precautions maintained

## 2020-03-04 ENCOUNTER — Telehealth: Payer: Self-pay | Admitting: *Deleted

## 2020-03-04 NOTE — Telephone Encounter (Signed)
Left VM requesting return call on how she is doing s/p transplant.

## 2020-03-05 ENCOUNTER — Encounter: Admit: 2020-03-05 | Discharge: 2020-03-06 | Payer: PRIVATE HEALTH INSURANCE

## 2020-03-05 DIAGNOSIS — Z7682 Awaiting organ transplant status: Secondary | ICD-10-CM | POA: Diagnosis not present

## 2020-03-05 DIAGNOSIS — C9110 Chronic lymphocytic leukemia of B-cell type not having achieved remission: Secondary | ICD-10-CM | POA: Diagnosis not present

## 2020-03-05 DIAGNOSIS — E039 Hypothyroidism, unspecified: Secondary | ICD-10-CM | POA: Diagnosis not present

## 2020-03-05 DIAGNOSIS — Z9484 Stem cells transplant status: Secondary | ICD-10-CM | POA: Diagnosis not present

## 2020-03-05 DIAGNOSIS — Z780 Asymptomatic menopausal state: Secondary | ICD-10-CM | POA: Diagnosis not present

## 2020-03-05 DIAGNOSIS — I10 Essential (primary) hypertension: Secondary | ICD-10-CM | POA: Diagnosis not present

## 2020-03-05 DIAGNOSIS — Z856 Personal history of leukemia: Secondary | ICD-10-CM | POA: Diagnosis not present

## 2020-03-05 DIAGNOSIS — M8589 Other specified disorders of bone density and structure, multiple sites: Secondary | ICD-10-CM | POA: Diagnosis not present

## 2020-03-05 DIAGNOSIS — D801 Nonfamilial hypogammaglobulinemia: Secondary | ICD-10-CM | POA: Diagnosis not present

## 2020-03-05 DIAGNOSIS — D619 Aplastic anemia, unspecified: Secondary | ICD-10-CM | POA: Diagnosis not present

## 2020-03-05 DIAGNOSIS — Z6820 Body mass index (BMI) 20.0-20.9, adult: Secondary | ICD-10-CM | POA: Diagnosis not present

## 2020-03-05 DIAGNOSIS — M8580 Other specified disorders of bone density and structure, unspecified site: Principal | ICD-10-CM

## 2020-03-05 LAB — CBC W/ AUTO DIFF
BASOPHILS RELATIVE PERCENT: 0.5 %
EOSINOPHILS ABSOLUTE COUNT: 0 10*9/L (ref 0.0–0.4)
EOSINOPHILS RELATIVE PERCENT: 0.2 %
HEMATOCRIT: 19.7 % — ABNORMAL LOW (ref 36.0–46.0)
HEMOGLOBIN: 7 g/dL — ABNORMAL LOW (ref 12.0–16.0)
LYMPHOCYTES ABSOLUTE COUNT: 0.1 10*9/L — ABNORMAL LOW (ref 1.5–5.0)
LYMPHOCYTES RELATIVE PERCENT: 18.9 %
MEAN CORPUSCULAR HEMOGLOBIN CONC: 35.7 g/dL (ref 31.0–37.0)
MEAN CORPUSCULAR HEMOGLOBIN: 28.2 pg (ref 26.0–34.0)
MEAN CORPUSCULAR VOLUME: 79 fL — ABNORMAL LOW (ref 80.0–100.0)
MEAN PLATELET VOLUME: 11.2 fL — ABNORMAL HIGH (ref 7.0–10.0)
MONOCYTES ABSOLUTE COUNT: 0.1 10*9/L — ABNORMAL LOW (ref 0.2–0.8)
MONOCYTES RELATIVE PERCENT: 10.9 %
NEUTROPHILS ABSOLUTE COUNT: 0.4 10*9/L — CL (ref 2.0–7.5)
NEUTROPHILS RELATIVE PERCENT: 52.8 %
PLATELET COUNT: 8 10*9/L — CL (ref 150–440)
RED BLOOD CELL COUNT: 2.49 10*12/L — ABNORMAL LOW (ref 4.00–5.20)
RED CELL DISTRIBUTION WIDTH: 13.9 % (ref 12.0–15.0)
WBC ADJUSTED: 0.8 10*9/L — ABNORMAL LOW (ref 4.5–11.0)

## 2020-03-05 LAB — COMPREHENSIVE METABOLIC PANEL
ALBUMIN: 3.4 g/dL (ref 3.4–5.0)
ALKALINE PHOSPHATASE: 131 U/L — ABNORMAL HIGH (ref 46–116)
ALT (SGPT): 73 U/L — ABNORMAL HIGH (ref 10–49)
ANION GAP: 7 mmol/L (ref 5–14)
AST (SGOT): 41 U/L — ABNORMAL HIGH (ref <=34)
BILIRUBIN TOTAL: 0.4 mg/dL (ref 0.3–1.2)
BLOOD UREA NITROGEN: 20 mg/dL (ref 9–23)
BUN / CREAT RATIO: 20
CALCIUM: 9.4 mg/dL (ref 8.7–10.4)
CHLORIDE: 106 mmol/L (ref 98–107)
CO2: 27 mmol/L (ref 20.0–31.0)
CREATININE: 1 mg/dL — ABNORMAL HIGH
EGFR CKD-EPI AA FEMALE: 71 mL/min/{1.73_m2} (ref >=60)
EGFR CKD-EPI NON-AA FEMALE: 61 mL/min/{1.73_m2} (ref >=60)
GLUCOSE RANDOM: 208 mg/dL — ABNORMAL HIGH (ref 70–179)
POTASSIUM: 4.2 mmol/L (ref 3.4–4.5)
SODIUM: 140 mmol/L (ref 135–145)

## 2020-03-05 LAB — PLATELET COUNT: Platelets:NCnc:Pt:Bld:Qn:Automated count: 11 — ABNORMAL LOW

## 2020-03-05 LAB — MICROCYTES

## 2020-03-05 LAB — MAGNESIUM: Magnesium:MCnc:Pt:Ser/Plas:Qn:: 1.5 — ABNORMAL LOW

## 2020-03-05 LAB — ALT (SGPT): Alanine aminotransferase:CCnc:Pt:Ser/Plas:Qn:: 73 — ABNORMAL HIGH

## 2020-03-05 LAB — TACROLIMUS, TROUGH: Lab: 6

## 2020-03-05 LAB — SMEAR REVIEW

## 2020-03-05 MED ORDER — NYSTATIN 100,000 UNIT/ML ORAL SUSPENSION
Freq: Four times a day (QID) | ORAL | 0 refills | 3 days | Status: CP
Start: 2020-03-05 — End: ?
  Filled 2020-03-07: qty 60, 3d supply, fill #0

## 2020-03-05 MED ORDER — CALCIUM CARBONATE-VITAMIN D3 600 MG (1,500 MG)-800 UNIT TABLET
ORAL_TABLET | Freq: Two times a day (BID) | ORAL | 5 refills | 0.00000 days | Status: CP
Start: 2020-03-05 — End: ?
  Filled 2020-03-05: qty 60, 30d supply, fill #0

## 2020-03-05 MED ORDER — MG-PLUS-PROTEIN 133 MG TABLET
ORAL_TABLET | Freq: Three times a day (TID) | ORAL | 2 refills | 34.00000 days | Status: CP
Start: 2020-03-05 — End: ?
  Filled 2020-03-05: qty 100, 34d supply, fill #0

## 2020-03-05 MED ADMIN — pegfilgrastim-cbqv (UDENYCA) injection 6 mg: 6 mg | SUBCUTANEOUS | @ 21:00:00 | Stop: 2020-03-05

## 2020-03-05 MED ADMIN — sodium chloride 0.9% (NS) bolus 500 mL: 500 mL | INTRAVENOUS | @ 17:00:00 | Stop: 2020-03-05

## 2020-03-05 MED ADMIN — heparin, porcine (PF) 100 unit/mL injection 200 Units: 200 [IU] | INTRAVENOUS | @ 15:00:00 | Stop: 2020-03-05

## 2020-03-05 MED FILL — CALCIUM CARBONATE-VITAMIN D3 600 MG (1,500 MG)-800 UNIT TABLET: 30 days supply | Qty: 60 | Fill #0 | Status: AC

## 2020-03-05 MED FILL — MG-PLUS-PROTEIN 133 MG TABLET: 34 days supply | Qty: 100 | Fill #0 | Status: AC

## 2020-03-05 NOTE — Unmapped (Signed)
It was great seeing you today.     Your counts remain low:  - We will give you Udenyca today (long acting WBC booster)  - We will give you blood and platelets today    Your magnesium is low  - Please increase your oral magnesium to 1 tablet three times a day    Pharmacy will look into get your Letermovir delivered    For your line, let's try the more sensitive dressing to see if this helps with irritation.     Your bone density is low (osteopenia)  - Please start Calcium + Vitamin D (1 tablet twice a day). You can pick this up today.     Your kidney function is up a little today  - Please continue to push your fluid intake  - We will give you 1/2 a liter of fluid today.     We will call you if we need to adjust your tacrolimus.     --------------------------------------  21st Century Cures Act   Regarding labs and other test results, please know that due to federal laws, all test results are now released and available for review on MyChart immediately upon becoming available. This means that unlike before, you will now receive results before we have had the opportunity to review them and either call to discuss or send you message/letter with an explanation after all results are back.     For some patients, seeing test results without explanation can cause anxiety about those results and even misunderstanding if a patient interprets them incorrectly. We regret any such anxiety you may experience if you choose to review labs before we have discussed them with you. Please note, however, that to prevent anxiety, we recommend that you consider waiting to review your results until you receive a call, message, or letter from [place]. That makes sure that you receive the interpretation with your labs, which can help to avoid misunderstood results and perhaps undue anxiety. Either way, please know we monitor your test results closely.  ------------------------------------------  COVID-19 information:  Given ongoing novel coronavirus (SARS-CoV-2/COVID-19), it is strongly recommended to avoid travel and crowds.  If you develop fever, cough, shortness of breath and/or have known exposure (close contact < 6 feet) with someone who has tested positive, please notify us immediately.    ??? Your best defenses are to stay home as much as possible and use good hand hygiene.  ??? We recommend that you wear a mask (N95 mask preferred) when leaving your home  ??? Important:   o A previous negative test does not mean you will never become infected  o It is unknown if a prior COVID-19 infection provides immunity against future infections    It is important for you to know that the available vaccines are safe.  However, they have not been tested in transplant/cellular therapy patients.  Therefore, we do not know if transplant/cellular therapy patients will develop an immune response (make antibody) against the virus.  We still recommend you obtain the vaccine as long as you are at least 3 months after your transplant/cellular therapy.  We do not recommend the vaccine sooner than 3 months as it is very unlikely that you will respond.        We continue to strongly recommended you avoid travel and crowds.  You should always wear a mask (N95) if you are around anyone outside of your own home, use good hand hygiene, and practice social distancing (>6 feet).  For  the foreseeable future, these things should continue even after you are vaccinated. If you develop fever, cough, shortness of breath and/or have known exposure (close contact < 6 feet) with someone who has tested positive, please notify us immediately.          Click Here to Visit [facility] Covid-19 Vaccine Hub  Get the latest facts on the COVID-19 vaccines.      Or visit [facility]???s COVID-19 Vaccine Hub at [email or web address] to review the latest facts about the vaccines.     -------------------------------------------------------------------------------  Return for labs and possible transfusions on Monday.     Return to clinic on Monday, Wednesday and Friday to see one of the providers. You will receive a time when you check out today.    Lab Results   Component Value Date    WBC 0.8 (L) 03/05/2020    HGB 7.0 (L) 03/05/2020    HCT 19.7 (L) 03/05/2020    PLT 8 (LL) 03/05/2020     Lab Results   Component Value Date    NA 140 03/05/2020    K 4.2 03/05/2020    CL 106 03/05/2020    CO2 27.0 03/05/2020    BUN 20 03/05/2020    CREATININE 1.00 (H) 03/05/2020    GLU 208 (H) 03/05/2020    CALCIUM 9.4 03/05/2020    MG 1.5 (L) 03/05/2020    PHOS 3.6 03/01/2020     Lab Results   Component Value Date    BILITOT 0.4 03/05/2020    BILIDIR 0.30 03/01/2020    PROT 5.8 03/05/2020    ALBUMIN 3.4 03/05/2020    ALT 73 (H) 03/05/2020    AST 41 (H) 03/05/2020    ALKPHOS 131 (H) 03/05/2020    GGT 22 01/09/2020     Lab Results   Component Value Date    INR 1.62 03/01/2020    INR 1.61 03/01/2020    APTT 31.9 03/01/2020    APTT 32.7 03/01/2020       For prescription refills:   For refills, please check your medication bottles to see if you have additional refills left. If so, please call your pharmacy and follow the directions to request a refill. If you do not have any refills left, please make a request during your clinic visit or by submitting a request through MyChart or by calling [phone]. Please allow 24 hours if your request is made during the week or 48 hours if requests are made on the weekends or holidays.     --------------------------------------------------------------------------------------------------------------------  For appointments & questions Monday through Friday 8 AM-4:30 PM     Please call [phone] or Toll free [phone]    On Nights, Weekends and Holidays  Call [phone] and ask for the oncologist on call    Please visit PrivacyFever.cz, a resource created just for family members and caregivers.  This website lists support services, how and where to ask for help. It has tools to assist you as you help [place] care for your loved one.    [facility]  [address]  [place], [place] [address]  [email or web address]

## 2020-03-05 NOTE — Unmapped (Addendum)
BMT Clinic Follow-up    Patient Name: Susan Kane  MRN: [id number]  Encounter date: 03/05/2020    Referring Physician: Dr. Jillyn Hidden Nida Boatman) [name]  Primary Care Provider: Thora Lance, MD  BMT Attending MD: Dr. Oswald Hillock    Disease: CLL  Current disease status: SD (stable disease)  Type of Transplant: RIC MUD Allo  Graft Source: Cryopreserved PBSCs  Transplant Day: 75     Study Participant: BMT CTN 1704: Composite Health Assessment Risk Model for Older Adults: Applying Pre-transplant Comorbidity, Geriatric Assessment and Biomarkers to Predict Non-Relapse Mortality After Allogeneic Transplant (CHARM).  ??  Interval History:  Susan Kane??is a 59 y.o.??woman??with a diagnosis of CLL??and aplastic anemia who is now s/p RIC MUD AlloSCT. Her transplant course was complicated by delayed engraftment. ??She was initially discharged from the hospital on 02/23/2020. However was readmitted on 02/27/20 with a fever and need for workup and IV cefepime. Infectious workup was unrevealing, IV cefepime was stopped on 03/02/20, and she was once again discharged on 03/03/20.      Chanya presents today for routine follow up after being discharged on 03/03/20. She is feeling fine today. She has not had any vomiting since discharge. Has some mild nausea with taking her pills but is using Zofran BID prior to meds with good relief. She denies any diarrhea. Her appetite is ok, she has been drinking a lot of milkshakes. Yesterday she had a toaster streudel, smoothie, and 1/2 a burger and fries. Her blood sugar is elevated today at 202, she had cereal and a hot chocolate before her visit. She is doing fair with fluids, getting in about 1.5L per day.   She denies any fevers, chills, or infectious symptoms. No rashes.  ??      Oncology History Overview Note   CLL:    Summary as per Dr. Kalman Drape most recent note with minor edits/additions from my review of records    1.??CLL?????diagnosed in August 2010, flow cytometry consistent with CLL  ?? Enlarged left??inguinal lymph node January 2019,??small??neck/axillary nodes and palpable splenomegaly 09/11/2017  ?? CTs??on 09/17/2017-3 cm necrotic appearing lymph node in the left inguinal region, borderline enlarged pelvic/retroperitoneal, chest, and axillary nodes. ??Mild splenomegaly.  ?? Ultrasound-guided biopsy of the left inguinal lymph node 09/18/2017, slightly purulent fluid aspirated, core biopsy is consistent with an atypical lymphoid proliferation???extensive necrosis with surrounding epithelioid histiocytes, limited intact lymphoid tissue involved with CLL  ?? Incisional biopsy of a necrotic/purulent left inguinal lymph node on 10/01/2017???extensive necrosis with granulomatous inflammation, small amount of viable lymphoid tissue involved with CLL, AFB and fungal stains negative  ?? Peripheral blood FISH analysis 02/05/2018??? +deletion 13q14, no evidence of p53 (17p13) deletion, no evidence of 11q22??deletion  - normal karyotype (46,XX)  ?? Bone marrow biopsy 02/26/2018???hypercellular marrow with extensive involvement by CLL, lymphocytes represent??85% of all cells  ?? Ibrutinib initiated 04/03/2018  ?? Ibrutinib placed on hold 04/11/2018 due to onset of arthralgias  ?? Ibrutinib resumed 04/16/2018, discontinued 04/25/2018 secondary to severe arthralgias/arthritis  ?? Ibrutinib resumed at a dose of 140 mg daily 05/03/2018  ?? Ibrutinib dose adjusted to 140 mg alternating with 280 mg 06/25/2018  ?? 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 ??Augmentin prescribed.  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    Baseline BM bx reviewed at [facility]  Final Diagnosis   (Outside Case #:  [id number], dated 02/26/2018)  Bone marrow, aspiration and biopsy  -  Hypercellular bone marrow (80%) with extensive involvement by chronic lymphocytic leukemia (87% lymphocytes by manual aspirate differential)  (See Comment)  -  By outside report, cytogenetic results are normal     Karyotype: 32, XX and FISH with 13q del but no 11q or 17p del    Repeat BM bx: (done after being off ibrutinib x1 month)  80% involvement by CLL    08/13/18: Presents to [facility] to discuss plan of care     Chronic lymphocytic leukemia (CLL), B-cell (CMS-HCC)   07/12/2018 Initial Diagnosis    Chronic lymphocytic leukemia (CLL), B-cell (CMS-HCC)     08/26/2018 -  Chemotherapy    Acalabrutinib started (approximate date)     11/14/2018 -  Chemotherapy    Acalabrutinib discontinued due to progressive bone marrow failure     12/06/2018 -  Chemotherapy    Ritux x1 given due to CLL still making up majority of BM cellularity (though in the setting of near aplasia of rest of TLH)     03/19/2019 -  Chemotherapy    Equine ATG / CsA for aplastic anemia (suspect 2/t acalabrutinib)     Chronic lymphocytic leukemia of B-cell type not having achieved remission (CMS-HCC)   01/21/2019 Initial Diagnosis    Chronic lymphocytic leukemia of B-cell type not having achieved remission (CMS-HCC)     01/01/2020 -  Chemotherapy    BMT IP BENDAMUSTINE / FLUDARABINE / RITUXIMAB (OP RITUXIMAB Days -13 & -6) (MUD)  bendamustine 130 mg/m2 IV on days -5, -4, -3   fludarabine 30 mg/m2 IV on days -5, -4, -3   riTUXimab 375 mg/m2 IV on day -13 as a OP treatment day, then 1,000 mg/m2 on days -6 as a OP treatment day, +1, +8   tacrolimus, 0.045 mg/kg PO BID on days -3 and -2 then  0.03 mg/kg PO BID starting on day -1   methotrexate 5 mg/m2 IV on days +1, +3, +6, +11   tbo-filgrastim 300 mcg (<= 75 kg) / 480 mcg (> 75 kg) q24h starting on day +7        Patient Active Problem List   Diagnosis   ??? Chronic lymphocytic leukemia (CLL), B-cell (CMS-HCC)   ??? Hypogammaglobulinemia (CMS-HCC)   ??? Chronic lymphocytic leukemia of B-cell type not having achieved remission (CMS-HCC)   ??? Aplastic anemia (CMS-HCC)   ??? 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 Lower abdominal pain   ??? Drug-induced nausea and vomiting   ??? Stem cell transplant candidate   ??? Chemotherapy induced neutropenia (CMS-HCC)   ??? History of allogeneic stem cell transplant (CMS-HCC)   ??? Osteopenia       Review of Systems:  A full system review was performed and was negative except as noted in the above interval history.    Reviewed and updated past medical, surgical, social, and family history as appropriate.      No Known Allergies  Current Outpatient Medications   Medication Sig Dispense Refill   ??? eltrombopag (PROMACTA) 50 MG tablet Take 2 tablets (100 mg total) by mouth daily. Administer on an empty stomach, 1 hour before or 2 hours after a meal. 60 tablet 2   ??? famotidine (PEPCID) 20 MG tablet Take 1 tablet (20 mg total) by mouth Two (2) times a day. 60 tablet 0   ???  folic acid (FOLVITE) 1 MG tablet Take 1 tablet (1 mg total) by mouth at bedtime. 30 tablet 5   ??? levothyroxine (SYNTHROID) 100 MCG tablet Take 1 tablet (100 mcg total) by mouth daily. 30 tablet 5   ??? lithium (LITHOBID) 300 MG ER tablet Take 1 tablet (300 mg total) by mouth at bedtime. 30 tablet 5   ??? magnesium oxide-Mg AA chelate (MAGNESIUM, AMINO ACID CHELATE,) 133 mg Tab Take 1 tablet by mouth Three (3) times a day. 100 tablet 2   ??? OLANZapine zydis (ZYPREXA) 5 MG disintegrating tablet Take 1 tablet (5 mg total) by mouth nightly. 30 tablet 2   ??? ondansetron (ZOFRAN-ODT) 8 MG disintegrating tablet Dissolve  1 tablet (8 mg total) by mouth every eight (8) hours as needed for nausea. 30 tablet 2   ??? posaconazole (NOXAFIL) 100 mg TbEC delayed released tablet Take 3 tablets (300 mg total) by mouth daily. 90 tablet 1   ??? tacrolimus (PROGRAF) 0.5 MG capsule Take 3 capsules (1.5 mg total) by mouth two (2) times a day. 180 capsule 5   ??? TRINTELLIX 10 mg tablet Take 1 tablet (10 mg total) by mouth at bedtime. 30 tablet 5   ??? valGANciclovir (VALCYTE) 450 mg tablet Take 2 tablets (900 mg total) by mouth Two (2) times a day. 120 tablet 0   ??? zolpidem (AMBIEN) 10 mg tablet Take 1 tablet (10 mg total) by mouth nightly as needed for sleep. 20 tablet 0   ??? calcium carbonate-vitamin D3 600 mg(1,500mg ) -800 unit Tab Take 1 tablet by mouth Two (2) times a day. (Patient not taking: Reported on 03/05/2020) 60 tablet 5   ??? letermovir (PREVYMIS) 480 mg tablet Take 1 tablet (480 mg total) by mouth daily. (Patient not taking: Reported on 03/05/2020) 28 tablet 3     No current facility-administered medications for this visit.     Facility-Administered Medications Ordered in Other Visits   Medication Dose Route Frequency Provider Last Rate Last Admin   ??? pegfilgrastim-cbqv (UDENYCA) injection 6 mg  6 mg Subcutaneous Once Yadiel Aubry Royall Darwin Rothlisberger, PA       ??? sodium chloride (NS) 0.9 % infusion  50 mL/hr Intravenous Continuous Havish Petties Royall Jaimey Franchini, PA           Physical Exam:  BP 110/56  - Pulse 88  - Temp 36.4 ??C (97.6 ??F) (Oral)  - Resp 18  - Ht 165.1 cm (5' 5)  - Wt 55.6 kg (122 lb 9.6 oz)  - LMP  (LMP Unknown)  - SpO2 99%  - BMI 20.40 kg/m??      General: No acute distress noted.   Central venous access: Line clean, dry, intact. No erythema or drainage noted.   ENT: Moist mucous membranes. Oropharhynx without lesions, erythema. Coating on tongue.   Cardiovascular: Pulse normal rate, regularity and rhythm. S1 and S2 normal, without any murmur, rub, or gallop.  Lungs: Clear to auscultation bilaterally, without wheezes/crackles/rhonchi. Good air movement.   Skin: Warm, dry, intact. No rash noted.   Psychiatry: Alert and oriented to person, place, and time.   Gastrointestinal/Abdomen: Normoactive bowel sounds, abdomen soft, non-tender   Musculoskeletal/Extremities: FROM throughout.   Neurologic: CNII-XII intact. Normal strength and sensation throughout    Karnofsky/Lansky Performance Status  80, Normal activity with effort; some signs or symptoms of disease (ECOG equivalent 1)    Test Results:   Reviewed in EPIC. Abnormal values discussed below.     Assessment/Plan:  Mrs. Baskins is 59 yo  woman with CLL as well as AA/bone marrow failure.    ??  CLL:??The CT of C/A/P on 10/10/19 revealed no evidence of LAD or organomegaly. There was possible endometrial fluid or thickening in the fundus of the uterus.  The BMBx on 10/07/19 showed:   Scant bone marrow sampling (<5% cellular) essentially devoid of hematopoietic elements.  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  - ??Routine cytogenetic analysis is pending.   - Corresponding CBC: WBC 1.0, ANC 0.1.??  ??  12/26/19: BMBx:   - ??Hypocellular bone marrow (20% overall) involved by chronic lymphocytic leukemia/small lymphocytic lymphoma, representing approximately 20% of marrow cells by PAX5 immunohistochemistry  - Normal karyotype: 46,XX[20]  ??  BMT:??SD (stable disease)  HCT-CI (age adjusted) 5??(active CLL, psych, age adjusted)    Conditioning:  1. Rituximab 375 mg/m2 on day -13 and 1000 mg/m2 on D-6, +1, and +8  2. Bendamustine 130 mg/m2 IV daily over 10 minutes on D-5 through -3  3. Fludarabine 30 mg/m2 IV daily over 30 minutes on D-5 through -3  ??  Donor: 10/10, ABO ), CMV negative    Engraftment: Granix starting D+12 through neutrophil recovery (as defined as ANC 1.0 x 2 days or 3.0 x 1 day)  - Viral studies showed weakly positive EBV, CMV, HHV-6, plan to monitor for now  - Day +30 BMBx: markedly hypocellular marrow (<5%) with essentially absent hematopoiesis.   - Chimerisms:       PB UF PB CD3 BM UF BM CD3   02/09/20 63% donor Insufficient CD3 cells to quantify      02/10/20   83% Donor Insufficient CD3 cells to quantify   ** Plan to repeat chimerism next week.     GvHD prophylaxis:   1. Tacrolimus goal 5-10, dose adjustments per BMT pharmacy. ??** Current dose 1.5mg  BID  2. Completed Methotrexate 5 mg/m2 on D+1, +3, +6 and +11  3. rATG 1 mg/kg on D-2 and -1 included  ??  Heme:  Transfusion criteria: Transfuse 1 unit of PRBCs for Hgb<7 and 1 unit of platelets for Plt <10K or bleeding. No history of transfusion reactions.   - Granix given 8/9, 8/11  - Udenyca 6mg  x1 given 8/13  - 03/05/20: Will give 1 unit of blood and platelets.     ID:????  Prophylaxis:  - Antiviral: Valcyte as below for CMV viremia.   - Antifungal: Continue Posaconazole 300 mg daily  - Antibacterial:??ANC 0.4 today, but giving Udenyca. If ANC remains <0.5 on next lab check, will start Cefdinir.    - PJP:  S/p pentamidine on 02/21/20, can replace with Bactrim once platelets engraft. For now will continue with monthly Pentamidine. ??    CMV:  - 02/24/20: CMV 207  - 8/10 CMV 884. Valganciclovir started.  ??  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  EBV: negative,  - Monitor weekly, ordered for next visit.     Neutropenic fever  03/02/20 Fever at home 100.4 and low grade in ER of 99.5. ??  - Cx NGTD  - IV cefepime (8/8-8/10)  - Now afebrile.   ??  GI:??  GERD Prophylaxis: Pepcid  ??  CINV:  - Anti-emetics per BMT protocol.   - Zofran works well for her  - Nightly zyprexa    Coated Tongue: Thrush?  - Start Nystatin solution PRN  ??  Renal:   -??Cr??1.0 today,??baseline of ~ 0.7. Drinking about 1.5L per day, will give bolus today.   ??  FEN:   - Continue folic acid.    Hypomagnesemia:  - Mg 1.5, increase Mag Chelate to 1 tablet TID.   ??  Endocrine:  Hypothyroid:??  - Continue Synthroid daily.   - Check TSH, T3, T4  ??  Hepatic:??  - VOD prophylaxis not warranted as non-myeloablative regimen.   ??  Hepatocellular liver injury pattern:  - Mild increase in AST, ALT, and AlkPhos, likely secondary to posaconazole. Remain stable today.     Endo:  - Blood sugar elevated today, 208. She had cereal and hot chocolate before her visit. If BS remains elevated at next visit will need to start monitoring and possibly start Metformin.    ??  CV:  HTN: currently not on treatment.  ??  Pulm:  AHRF: First noted on 7/22. Intermittent O2 requirement with exam findings concerning for LLL crackles. No signs of volume overload on exam.  - Chest-X ray clear  - Now on RA.     Neuro/Pain:??  - No c/o at this time    Psych:??Has a home psychiatrist.   Depression/Anxiety:   - Per outpt note pt was taking Xanax 0.25 mg BID PRN for anxiety but this was not on her home med list thus I did not re-order at this time. Discussed with patient who stated she rarely takes.  - Continue Lithium 300mg  nightly  - Trintellix 10mg  nightly  - Psych consult PRN.   ??  Insomnia:  - Ambien 10mg  nightly PRN  ??  Caregiver/Lodging:  - Husband [name] will be primary caregiver  - Staying at local rental.     I personally spent 90 minutes face-to-face and non-face-to-face in the care of this patient, which includes all pre, intra, and post visit time on the date of service.    Barnetta Hammersmith, PA  [facility]

## 2020-03-05 NOTE — Unmapped (Signed)
Bone Marrow Transplant and Cellular Therapy Program  Immunosuppressive Therapy Note    Susan Kane is a 59 y.o. 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 Susan Kane is currently day +51.    Current dose: tacrolimus 1.5 mg PO BID    Goal tacrolimus Level: 5-10 ng/mL    Resulted level: 6 ng/mL     Lab Results   Component Value Date/Time    TACROLIMUS 6.0 03/05/2020 10:54 AM    TACROLIMUS 7.8 03/02/2020 08:01 AM    TACROLIMUS 6.2 02/27/2020 12:30 PM    TACROLIMUS 8.0 02/24/2020 10:20 AM     Lab Results   Component Value Date/Time    CREATININE 1.00 (H) 03/05/2020 10:54 AM    CREATININE 0.82 (H) 03/03/2020 12:09 AM    CREATININE 0.80 03/02/2020 12:08 AM    CREATININE 0.81 (H) 02/29/2020 10:40 PM      Assessment/Recommendation: Tacrolimus level is therapeutic. Level was drawn a few hours late, so true tough is likely higher and closer to upper end of target goal range. Her SCr is up slightly, but stable overall. LFTs are mildly elevated, TBili WNL. Given the above, plan to continue current tacrolimus dose of 1.5 mg PO BID and will recheck at next clinic visit to further assess trend and need for adjustment.    We will continue to monitor levels.  Patient will be followed for changes in renal and hepatic function, toxicity, and efficacy.     Rulon Abide, PharmD CPP  [facility]

## 2020-03-05 NOTE — Telephone Encounter (Signed)
Spoke w/patient: she was in process of getting 2 units blood and 1 unit platelets. She is feeling well--just a lot of fatigue. Still staying in the apartment off campus and will be there most likely to end of September. Her daughter stays w/her during the week and husband comes for weekend. The PICC line was pulled and she has a triple lumen Hickman cath in place now. Appreciates the call and she agrees to keep in touch with updates.

## 2020-03-06 NOTE — Unmapped (Signed)
Addended by: Barnetta Hammersmith R on: 03/05/2020 08:30 PM     Modules accepted: Orders

## 2020-03-06 NOTE — Unmapped (Signed)
1730 Handoff report received from Juliane Poot, Charity fundraiser. Patient tolerated 1 unit of platelets without event. Patient discharged ambulatory in no distress with her family member at 23.

## 2020-03-06 NOTE — Unmapped (Signed)
Patient received one unit of platelts and one unit of pRBC's according to therapy plan protocol. Her post-platelet count did not meet plan parameters and a second unit was ordered. She was given an Udenyca injection and IVF according to therapy plans A & C. Her care was transferred to another RN to complete her second platelet infusion.

## 2020-03-07 ENCOUNTER — Ambulatory Visit: Admit: 2020-03-07 | Discharge: 2020-03-08 | Payer: PRIVATE HEALTH INSURANCE

## 2020-03-07 DIAGNOSIS — Z7682 Awaiting organ transplant status: Secondary | ICD-10-CM | POA: Diagnosis not present

## 2020-03-07 DIAGNOSIS — Z9484 Stem cells transplant status: Secondary | ICD-10-CM | POA: Diagnosis not present

## 2020-03-07 DIAGNOSIS — Z79899 Other long term (current) drug therapy: Secondary | ICD-10-CM | POA: Diagnosis not present

## 2020-03-07 DIAGNOSIS — C9110 Chronic lymphocytic leukemia of B-cell type not having achieved remission: Secondary | ICD-10-CM | POA: Diagnosis not present

## 2020-03-07 LAB — TOXIC GRANULATION

## 2020-03-07 LAB — CBC W/ AUTO DIFF
BASOPHILS ABSOLUTE COUNT: 0 10*9/L (ref 0.0–0.1)
BASOPHILS RELATIVE PERCENT: 0.3 %
EOSINOPHILS RELATIVE PERCENT: 0.1 %
HEMATOCRIT: 21.6 % — ABNORMAL LOW (ref 36.0–46.0)
HEMOGLOBIN: 7.7 g/dL — ABNORMAL LOW (ref 12.0–16.0)
LARGE UNSTAINED CELLS: 8 % — ABNORMAL HIGH (ref 0–4)
LYMPHOCYTES ABSOLUTE COUNT: 0.4 10*9/L — ABNORMAL LOW (ref 1.5–5.0)
LYMPHOCYTES RELATIVE PERCENT: 17.9 %
MEAN CORPUSCULAR HEMOGLOBIN CONC: 35.8 g/dL (ref 31.0–37.0)
MEAN CORPUSCULAR HEMOGLOBIN: 28.6 pg (ref 26.0–34.0)
MEAN CORPUSCULAR VOLUME: 79.9 fL — ABNORMAL LOW (ref 80.0–100.0)
MEAN PLATELET VOLUME: 9.5 fL (ref 7.0–10.0)
MONOCYTES ABSOLUTE COUNT: 0.2 10*9/L (ref 0.2–0.8)
MONOCYTES RELATIVE PERCENT: 8.6 %
NEUTROPHILS ABSOLUTE COUNT: 1.4 10*9/L — ABNORMAL LOW (ref 2.0–7.5)
NEUTROPHILS RELATIVE PERCENT: 64.9 %
RED BLOOD CELL COUNT: 2.7 10*12/L — ABNORMAL LOW (ref 4.00–5.20)
RED CELL DISTRIBUTION WIDTH: 14.6 % (ref 12.0–15.0)
WBC ADJUSTED: 2.2 10*9/L — ABNORMAL LOW (ref 4.5–11.0)

## 2020-03-07 LAB — LYMPHOCYTES RELATIVE PERCENT: Lymphocytes/100 leukocytes:NFr:Pt:Bld:Qn:Automated count: 17.9

## 2020-03-07 LAB — SLIDE REVIEW

## 2020-03-07 LAB — COMPREHENSIVE METABOLIC PANEL
ALKALINE PHOSPHATASE: 141 U/L — ABNORMAL HIGH (ref 46–116)
ALT (SGPT): 61 U/L — ABNORMAL HIGH (ref 10–49)
ANION GAP: 5 mmol/L (ref 5–14)
AST (SGOT): 26 U/L (ref <=34)
BILIRUBIN TOTAL: 1 mg/dL (ref 0.3–1.2)
BLOOD UREA NITROGEN: 16 mg/dL (ref 9–23)
BUN / CREAT RATIO: 18
CHLORIDE: 108 mmol/L — ABNORMAL HIGH (ref 98–107)
CREATININE: 0.87 mg/dL — ABNORMAL HIGH
EGFR CKD-EPI AA FEMALE: 84 mL/min/{1.73_m2} (ref >=60)
EGFR CKD-EPI NON-AA FEMALE: 73 mL/min/{1.73_m2} (ref >=60)
GLUCOSE RANDOM: 160 mg/dL (ref 70–179)
POTASSIUM: 3.7 mmol/L (ref 3.4–4.5)
PROTEIN TOTAL: 5.8 g/dL (ref 5.7–8.2)
SODIUM: 140 mmol/L (ref 135–145)

## 2020-03-07 LAB — MAGNESIUM: Magnesium:MCnc:Pt:Ser/Plas:Qn:: 1.6

## 2020-03-07 LAB — BILIRUBIN TOTAL: Bilirubin:MCnc:Pt:Ser/Plas:Qn:: 1

## 2020-03-07 MED ADMIN — sodium chloride 0.9% (NS) bolus 500 mL: 500 mL | INTRAVENOUS | @ 13:00:00 | Stop: 2020-03-07

## 2020-03-07 MED FILL — NYSTATIN 100,000 UNIT/ML ORAL SUSPENSION: 3 days supply | Qty: 60 | Fill #0 | Status: AC

## 2020-03-07 NOTE — Unmapped (Signed)
Hospital Outpatient Visit on 03/07/2020   Component Date Value Ref Range Status   ??? Sodium 03/07/2020 140  135 - 145 mmol/L Final   ??? Potassium 03/07/2020 3.7  3.4 - 4.5 mmol/L Final   ??? Chloride 03/07/2020 108* 98 - 107 mmol/L Final   ??? Anion Gap 03/07/2020 5  5 - 14 mmol/L Final   ??? CO2 03/07/2020 27.0  20.0 - 31.0 mmol/L Final   ??? BUN 03/07/2020 16  9 - 23 mg/dL Final   ??? Creatinine 03/07/2020 0.87* 0.60 - 0.80 mg/dL Final   ??? BUN/Creatinine Ratio 03/07/2020 18   Final   ??? EGFR CKD-EPI Non-African American,* 03/07/2020 73  >=60 mL/min/1.79m2 Final   ??? EGFR CKD-EPI African American, Fem* 03/07/2020 84  >=60 mL/min/1.2m2 Final   ??? Glucose 03/07/2020 160  70 - 179 mg/dL Final   ??? Calcium 16/04/9603 9.7  8.7 - 10.4 mg/dL Final   ??? Albumin 54/03/8118 3.4  3.4 - 5.0 g/dL Final   ??? Total Protein 03/07/2020 5.8  5.7 - 8.2 g/dL Final   ??? Total Bilirubin 03/07/2020 1.0  0.3 - 1.2 mg/dL Final   ??? AST 14/78/2956 26  <=34 U/L Final   ??? ALT 03/07/2020 61* 10 - 49 U/L Final   ??? Alkaline Phosphatase 03/07/2020 141* 46 - 116 U/L Final   ??? Magnesium 03/07/2020 1.6  1.6 - 2.6 mg/dL Final   ??? WBC 21/30/8657 2.2* 4.5 - 11.0 10*9/L Final   ??? RBC 03/07/2020 2.70* 4.00 - 5.20 10*12/L Final   ??? HGB 03/07/2020 7.7* 12.0 - 16.0 g/dL Final   ??? HCT 84/69/6295 21.6* 36.0 - 46.0 % Final   ??? MCV 03/07/2020 79.9* 80.0 - 100.0 fL Final   ??? MCH 03/07/2020 28.6  26.0 - 34.0 pg Final   ??? MCHC 03/07/2020 35.8  31.0 - 37.0 g/dL Final   ??? RDW 28/41/3244 14.6  12.0 - 15.0 % Final   ??? MPV 03/07/2020 9.5  7.0 - 10.0 fL Final   ??? Platelet 03/07/2020 14* 150 - 440 10*9/L Final   ??? Variable HGB Concentration 03/07/2020 Slight* Not Present Final   ??? Neutrophils % 03/07/2020 64.9  % Final   ??? Lymphocytes % 03/07/2020 17.9  % Final   ??? Monocytes % 03/07/2020 8.6  % Final   ??? Eosinophils % 03/07/2020 0.1  % Final   ??? Basophils % 03/07/2020 0.3  % Final   ??? Absolute Neutrophils 03/07/2020 1.4* 2.0 - 7.5 10*9/L Final   ??? Absolute Lymphocytes 03/07/2020 0.4* 1.5 - 5.0 10*9/L Final   ??? Absolute Monocytes 03/07/2020 0.2  0.2 - 0.8 10*9/L Final   ??? Absolute Eosinophils 03/07/2020 0.0  0.0 - 0.4 10*9/L Final   ??? Absolute Basophils 03/07/2020 0.0  0.0 - 0.1 10*9/L Final   ??? Large Unstained Cells 03/07/2020 8* 0 - 4 % Final   ??? Microcytosis 03/07/2020 Slight* Not Present Final   ??? Hyperchromasia 03/07/2020 Slight* Not Present Final   ??? Smear Review Comments 03/07/2020 See Comment* Undefined Final    Slide reviewed     ??? Toxic Granulation 03/07/2020 Present* Not Present Final   ??? Status 03/07/2020 Release/Cancel   Final   ??? Status 03/07/2020 Release/Cancel   Final   ??? Crossmatch 03/07/2020 Compatible   Final   ??? Unit Blood Type 03/07/2020 O Neg   Final   ??? ISBT Number 03/07/2020 9500   Final   ??? Unit # 03/07/2020 [id number]   Final   ??? Status  03/07/2020 Issued   Final   ??? Spec Expiration 03/07/2020 [id number]   Final   ??? Product ID 03/07/2020 Red Blood Cells   Final   ??? PRODUCT CODE 03/07/2020 [id number]   Final   ??? Crossmatch 03/07/2020 Compatible   Final   ??? Unit Blood Type 03/07/2020 O Neg   Final   ??? ISBT Number 03/07/2020 9500   Final   ??? Unit # 03/07/2020 [id number]   Final   ??? Status 03/07/2020 Ready   Final   ??? Spec Expiration 03/07/2020 [id number]   Final   ??? Product ID 03/07/2020 Red Blood Cells   Final   ??? PRODUCT CODE 03/07/2020 [id number]   Final

## 2020-03-07 NOTE — Unmapped (Signed)
Patient received of IVF according to therapy plan C. She received one unit of pRBC's according to therapy plan protocol. The medial lumen of her CVC was flushed and heparin locked. She was provided an AVS and discharged.

## 2020-03-08 ENCOUNTER — Encounter: Admit: 2020-03-08 | Discharge: 2020-03-09 | Payer: PRIVATE HEALTH INSURANCE

## 2020-03-08 ENCOUNTER — Ambulatory Visit: Payer: BC Managed Care – PPO | Admitting: Psychiatry

## 2020-03-08 DIAGNOSIS — D693 Immune thrombocytopenic purpura: Secondary | ICD-10-CM | POA: Diagnosis not present

## 2020-03-08 DIAGNOSIS — Z79899 Other long term (current) drug therapy: Secondary | ICD-10-CM | POA: Diagnosis not present

## 2020-03-08 DIAGNOSIS — C9112 Chronic lymphocytic leukemia of B-cell type in relapse: Secondary | ICD-10-CM | POA: Diagnosis not present

## 2020-03-08 DIAGNOSIS — E039 Hypothyroidism, unspecified: Secondary | ICD-10-CM | POA: Diagnosis not present

## 2020-03-08 DIAGNOSIS — I10 Essential (primary) hypertension: Secondary | ICD-10-CM | POA: Diagnosis not present

## 2020-03-08 DIAGNOSIS — C9110 Chronic lymphocytic leukemia of B-cell type not having achieved remission: Secondary | ICD-10-CM | POA: Diagnosis not present

## 2020-03-08 DIAGNOSIS — D619 Aplastic anemia, unspecified: Secondary | ICD-10-CM | POA: Diagnosis not present

## 2020-03-08 DIAGNOSIS — Z9484 Stem cells transplant status: Secondary | ICD-10-CM | POA: Diagnosis not present

## 2020-03-08 LAB — COMPREHENSIVE METABOLIC PANEL
ALBUMIN: 3.4 g/dL (ref 3.4–5.0)
ALKALINE PHOSPHATASE: 143 U/L — ABNORMAL HIGH (ref 46–116)
ALT (SGPT): 54 U/L — ABNORMAL HIGH (ref 10–49)
ANION GAP: 5 mmol/L (ref 5–14)
AST (SGOT): 21 U/L (ref <=34)
BILIRUBIN TOTAL: 0.8 mg/dL (ref 0.3–1.2)
BLOOD UREA NITROGEN: 16 mg/dL (ref 9–23)
BUN / CREAT RATIO: 20
CALCIUM: 9.2 mg/dL (ref 8.7–10.4)
CHLORIDE: 107 mmol/L (ref 98–107)
CO2: 27 mmol/L (ref 20.0–31.0)
CREATININE: 0.81 mg/dL — ABNORMAL HIGH
EGFR CKD-EPI AA FEMALE: >90 mL/min/{1.73_m2} (ref >=60)
EGFR CKD-EPI NON-AA FEMALE: 79 mL/min/{1.73_m2} (ref >=60)
GLUCOSE RANDOM: 149 mg/dL (ref 70–179)
POTASSIUM: 3.4 mmol/L (ref 3.4–4.5)
SODIUM: 139 mmol/L (ref 135–145)

## 2020-03-08 LAB — CBC W/ AUTO DIFF
BASOPHILS ABSOLUTE COUNT: 0 10*9/L (ref 0.0–0.1)
BASOPHILS RELATIVE PERCENT: 0.2 %
EOSINOPHILS ABSOLUTE COUNT: 0 10*9/L (ref 0.0–0.4)
EOSINOPHILS RELATIVE PERCENT: 0.2 %
HEMATOCRIT: 24.3 % — ABNORMAL LOW (ref 36.0–46.0)
HEMOGLOBIN: 8.8 g/dL — ABNORMAL LOW (ref 12.0–16.0)
LARGE UNSTAINED CELLS: 6 % — ABNORMAL HIGH (ref 0–4)
LYMPHOCYTES ABSOLUTE COUNT: 0.5 10*9/L — ABNORMAL LOW (ref 1.5–5.0)
LYMPHOCYTES RELATIVE PERCENT: 16.7 %
MEAN CORPUSCULAR HEMOGLOBIN CONC: 36.3 g/dL (ref 31.0–37.0)
MEAN CORPUSCULAR VOLUME: 80.5 fL (ref 80.0–100.0)
MEAN PLATELET VOLUME: 8.5 fL (ref 7.0–10.0)
MONOCYTES ABSOLUTE COUNT: 0.2 10*9/L (ref 0.2–0.8)
MONOCYTES RELATIVE PERCENT: 5.3 %
NEUTROPHILS ABSOLUTE COUNT: 2.1 10*9/L (ref 2.0–7.5)
NEUTROPHILS RELATIVE PERCENT: 71.7 %
PLATELET COUNT: <5 10*9/L — CL (ref 150–440)
RED BLOOD CELL COUNT: 3.02 10*12/L — ABNORMAL LOW (ref 4.00–5.20)
RED CELL DISTRIBUTION WIDTH: 14.4 % (ref 12.0–15.0)
WBC ADJUSTED: 2.9 10*9/L — ABNORMAL LOW (ref 4.5–11.0)

## 2020-03-08 LAB — WBC ADJUSTED: Leukocytes:NCnc:Pt:Bld:Qn:: 2.9 — ABNORMAL LOW

## 2020-03-08 LAB — PLATELET COUNT: Platelets:NCnc:Pt:Bld:Qn:Automated count: 12 — ABNORMAL LOW

## 2020-03-08 LAB — MAGNESIUM: Magnesium:MCnc:Pt:Ser/Plas:Qn:: 1.3 — ABNORMAL LOW

## 2020-03-08 LAB — LACTATE DEHYDROGENASE: Lactate dehydrogenase:CCnc:Pt:Ser/Plas:Qn:Reaction: pyruvate to lactate: 247 — ABNORMAL HIGH

## 2020-03-08 LAB — CREATININE: Creatinine:MCnc:Pt:Ser/Plas:Qn:: 0.81 — ABNORMAL HIGH

## 2020-03-08 LAB — SMEAR REVIEW

## 2020-03-08 MED ORDER — FAMOTIDINE 20 MG TABLET
ORAL_TABLET | Freq: Two times a day (BID) | ORAL | 0 refills | 30.00000 days | Status: CP | PRN
Start: 2020-03-08 — End: 2020-04-07
  Filled 2020-03-17: qty 60, 30d supply, fill #0

## 2020-03-08 MED ORDER — ELTROMBOPAG 50 MG TABLET
ORAL_TABLET | Freq: Every day | ORAL | 2 refills | 30.00000 days | Status: CP
Start: 2020-03-08 — End: ?
  Filled 2020-03-15: qty 60, 30d supply, fill #0

## 2020-03-08 MED ORDER — LEVOTHYROXINE 100 MCG TABLET
ORAL_TABLET | Freq: Every day | ORAL | 5 refills | 30.00000 days | Status: CP
Start: 2020-03-08 — End: ?
  Filled 2020-03-17: qty 30, 30d supply, fill #0

## 2020-03-08 MED ADMIN — heparin, porcine (PF) 100 unit/mL injection 200 Units: 200 [IU] | INTRAVENOUS | @ 18:00:00 | Stop: 2020-03-08

## 2020-03-08 MED ADMIN — heparin, porcine (PF) 100 unit/mL injection 200 Units: 200 [IU] | INTRAVENOUS | @ 22:00:00 | Stop: 2020-03-09

## 2020-03-08 MED ADMIN — magnesium sulfate 2gm/50mL IVPB: 2 g | INTRAVENOUS | @ 20:00:00 | Stop: 2020-03-08

## 2020-03-08 NOTE — Unmapped (Signed)
Called patient to notify of scheduled appt times for today 8/16.  She confirmed.

## 2020-03-08 NOTE — Unmapped (Signed)
SW Outpatient Note:    Service: BMT Clinic    SW called Pt for post-transplant SW psychosocial check-in.  Pt reports no current home health services.  Confirmed that caregiver plan is in place and working well, per Pt's report.  Pt cites that husband, Brain, is main primary caregiver, with daughter, Gordy Councilman, also in town as caregiver.  When daughter returns home, Pt's sister-in-law, Marguerita Merles, will be arriving to support Pt in caregiver role.  No issues/concerns reported by Pt, and overall she reports that things are going really well.  Pt is aware that she can contact SW for support; no SW needs identified at this time.    Grace Isaac, LCSW  [facility] Social Work   Phone [phone]  Pager [phone]

## 2020-03-08 NOTE — Unmapped (Signed)
Ok to take pepcid as needed vs on a regular schedule  We are going to have you back Wednesday and Friday.       Recent Results (from the past 24 hour(s))   Prepare RBC    Collection Time: 03/08/20  7:05 AM   Result Value Ref Range    Crossmatch Compatible     Unit Blood Type O Neg     ISBT Number 9500     Unit # [id number]     Status Issued     Spec Expiration [id number]     Product ID Red Blood Cells     PRODUCT CODE [id number]     Crossmatch Compatible     Unit Blood Type O Neg     ISBT Number 9500     Unit # [id number]     Status Released to Avail     Spec Expiration [id number]     Product ID Red Blood Cells     PRODUCT CODE [id number]    Comprehensive Metabolic Panel    Collection Time: 03/08/20  1:49 PM   Result Value Ref Range    Sodium 139 135 - 145 mmol/L    Potassium 3.4 3.4 - 4.5 mmol/L    Chloride 107 98 - 107 mmol/L    Anion Gap 5 5 - 14 mmol/L    CO2 27.0 20.0 - 31.0 mmol/L    BUN 16 9 - 23 mg/dL    Creatinine 7.56 (H) 0.60 - 0.80 mg/dL    BUN/Creatinine Ratio 20     EGFR CKD-EPI Non-African American, Female 79 >=60 mL/min/1.67m2    EGFR CKD-EPI African American, Female >90 >=60 mL/min/1.7m2    Glucose 149 70 - 179 mg/dL    Calcium 9.2 8.7 - 43.3 mg/dL    Albumin 3.4 3.4 - 5.0 g/dL    Total Protein 5.7 5.7 - 8.2 g/dL    Total Bilirubin 0.8 0.3 - 1.2 mg/dL    AST 21 <=29 U/L    ALT 54 (H) 10 - 49 U/L    Alkaline Phosphatase 143 (H) 46 - 116 U/L   Magnesium Level    Collection Time: 03/08/20  1:49 PM   Result Value Ref Range    Magnesium 1.3 (L) 1.6 - 2.6 mg/dL   Lactate dehydrogenase    Collection Time: 03/08/20  1:49 PM   Result Value Ref Range    LDH 247 (H) 120 - 246 U/L   DNA Fingerprinting, Post-Transplant,CD3,Blood    Collection Time: 03/08/20  1:49 PM   Result Value Ref Range    Collection Collected    CBC w/ Differential    Collection Time: 03/08/20  1:49 PM   Result Value Ref Range    WBC 2.9 (L) 4.5 - 11.0 10*9/L    RBC 3.02 (L) 4.00 - 5.20 10*12/L    HGB 8.8 (L) 12.0 - 16.0 g/dL HCT 51.8 (L) 84.1 - 66.0 %    MCV 80.5 80.0 - 100.0 fL    MCH 29.3 26.0 - 34.0 pg    MCHC 36.3 31.0 - 37.0 g/dL    RDW 63.0 16.0 - 10.9 %    MPV 8.5 7.0 - 10.0 fL    Platelet <5 (LL) 150 - 440 10*9/L    Variable HGB Concentration Slight (A) Not Present    Neutrophils % 71.7 %    Lymphocytes % 16.7 %    Monocytes % 5.3 %    Eosinophils % 0.2 %    Basophils %  0.2 %    Absolute Neutrophils 2.1 2.0 - 7.5 10*9/L    Absolute Lymphocytes 0.5 (L) 1.5 - 5.0 10*9/L    Absolute Monocytes 0.2 0.2 - 0.8 10*9/L    Absolute Eosinophils 0.0 0.0 - 0.4 10*9/L    Absolute Basophils 0.0 0.0 - 0.1 10*9/L    Large Unstained Cells 6 (H) 0 - 4 %    Microcytosis Slight (A) Not Present    Hyperchromasia Slight (A) Not Present   Prepare Platelet Pheresis    Collection Time: 03/08/20  3:25 PM   Result Value Ref Range    Unit Blood Type A Neg     ISBT Number 0600     Unit # [id number]     Status Issued     Product ID Platelets     PRODUCT CODE [id number]

## 2020-03-08 NOTE — Unmapped (Signed)
1445: Patient present for IV Mag and PLT transfusion, no acute concerns reported. Consent noted in chart; pre-meds not needed per patient. Tiney Rouge paged regarding IV Mag order, awaiting new order.     1545: Platelet transfusion in progress, 5 minute vitals obtained; no sign of reaction noted. Rate increased from 120 mL/hr to 700 mL/hr per protocol. Will continue to monitor.    1620: Platelet transfusion complete, no reaction noted. IV Mag in progress.     1750: IV Mag complete. Post-transfusion platelets 12, no additional platelets needed. Central line care performed; new caps placed, lumens flushed and heparin locked per protocol. AVS provided. Patient discharged home alert, oriented and in stable condition with family present.

## 2020-03-09 LAB — CMV DNA, QUANTITATIVE, PCR: CMV QUANT: 140 [IU]/mL — ABNORMAL HIGH (ref <0)

## 2020-03-09 LAB — CMV COMMENT: Lab: 0

## 2020-03-09 LAB — TACROLIMUS, TROUGH: Lab: 5.2

## 2020-03-09 NOTE — Unmapped (Signed)
After a thorough review of the chart this patient does not meet criteria for a Transitions Call at this time. Please contact [phone] with any further questions.    Patient contacted already Grace Isaac, LCSW Department: [facility] Case Management for follow up.            Meredeth Ide, RN, BSN - Case Manager,Transitions  [facility]  [address] [place], [place] [address]  P: [phone] Main P: [phone]  [email or web address]

## 2020-03-09 NOTE — Unmapped (Signed)
Results for Susan Kane, Susan Kane (MRN [id number]) as of 03/08/2020 17:39   Ref. Range 03/08/2020 13:49 03/08/2020 16:55   WBC Latest Ref Range: 4.5 - 11.0 10*9/L 2.9 (L)    RBC Latest Ref Range: 4.00 - 5.20 10*12/L 3.02 (L)    HGB Latest Ref Range: 12.0 - 16.0 g/dL 8.8 (L)    HCT Latest Ref Range: 36.0 - 46.0 % 24.3 (L)    MCV Latest Ref Range: 80.0 - 100.0 fL 80.5    MCH Latest Ref Range: 26.0 - 34.0 pg 29.3    MCHC Latest Ref Range: 31.0 - 37.0 g/dL 81.1    RDW Latest Ref Range: 12.0 - 15.0 % 14.4    MPV Latest Ref Range: 7.0 - 10.0 fL 8.5    Platelet Latest Ref Range: 150 - 440 10*9/L <5 (LL) 12 (L)   Neutrophils % Latest Units: % 71.7    Lymphocytes % Latest Units: % 16.7    Monocytes % Latest Units: % 5.3    Eosinophils % Latest Units: % 0.2    Basophils % Latest Units: % 0.2    Absolute Neutrophils Latest Ref Range: 2.0 - 7.5 10*9/L 2.1    Absolute Lymphocytes Latest Ref Range: 1.5 - 5.0 10*9/L 0.5 (L)    Absolute Monocytes  Latest Ref Range: 0.2 - 0.8 10*9/L 0.2    Absolute Eosinophils Latest Ref Range: 0.0 - 0.4 10*9/L 0.0    Absolute Basophils  Latest Ref Range: 0.0 - 0.1 10*9/L 0.0    Microcytosis Latest Ref Range: Not Present  Slight (A)    Hyperchromasia Latest Ref Range: Not Present  Slight (A)    Smear Review Latest Ref Range: Undefined  See Comment (A)    Large Unstained Cells Latest Ref Range: 0 - 4 % 6 (H)    Variable Hemoglobin Concentration Latest Ref Range: Not Present  Slight (A)    Sodium Latest Ref Range: 135 - 145 mmol/L 139    Potassium Latest Ref Range: 3.4 - 4.5 mmol/L 3.4    Chloride Latest Ref Range: 98 - 107 mmol/L 107    CO2 Latest Ref Range: 20.0 - 31.0 mmol/L 27.0    Bun Latest Ref Range: 9 - 23 mg/dL 16    Creatinine Latest Ref Range: 0.60 - 0.80 mg/dL 9.14 (H)    BUN/Creatinine Ratio Unknown 20    EGFR CKD-EPI African American, Female Latest Ref Range: >=60 mL/min/1.55m2 >90    EGFR CKD-EPI Non-African American, Female Latest Ref Range: >=60 mL/min/1.20m2 79    Anion Gap Latest Ref Range: 5 - 14 mmol/L 5    Glucose Latest Ref Range: 70 - 179 mg/dL 782    Calcium Latest Ref Range: 8.7 - 10.4 mg/dL 9.2    Magnesium Latest Ref Range: 1.6 - 2.6 mg/dL 1.3 (L)    Albumin Latest Ref Range: 3.4 - 5.0 g/dL 3.4    Total Protein Latest Ref Range: 5.7 - 8.2 g/dL 5.7    Total Bilirubin Latest Ref Range: 0.3 - 1.2 mg/dL 0.8    AST Latest Ref Range: <=34 U/L 21    ALT Latest Ref Range: 10 - 49 U/L 54 (H)    Alkaline Phosphatase Latest Ref Range: 46 - 116 U/L 143 (H)    LDH Latest Ref Range: 120 - 246 U/L 247 (H)      Please call the clinic (or inpatient unit if after hours) if you have any questions or concerns.     --------  For appointments &  questions Monday through Friday 8 AM- 4:30 PM please call [phone] or Toll free [phone].     ??? BMT Post Transplant RN Coordinator for patient specific/non-urgent questions: [phone] (Mon-Fri 8 am-4:30 pm)  ??? BMT Post Transplant Urgent Needs/Questions Weekday(Mon-Fri: 8 am - 4:30 pm): [phone]   ??? BMT Unit for Week night (Mon-Fri after 4:30 pm) and Weekend Urgent Questions: [phone]       [facility]  [address]  [place], [place] [address]  [email or web address]

## 2020-03-09 NOTE — Unmapped (Signed)
BMT Clinic Follow-up    Patient Name: Susan Kane  MRN: [id number]  Encounter date: 03/08/2020    Referring Physician: Dr. Jillyn Hidden Nida Boatman) [name]  Primary Care Provider: Thora Lance, MD  BMT Attending MD: Dr. Oswald Hillock    Disease: CLL  Current disease status: SD (stable disease)  Type of Transplant: RIC MUD Allo  Graft Source: Cryopreserved PBSCs  Transplant Day: 63     Study Participant: BMT CTN 1704: Composite Health Assessment Risk Model for Older Adults: Applying Pre-transplant Comorbidity, Geriatric Assessment and Biomarkers to Predict Non-Relapse Mortality After Allogeneic Transplant (CHARM).  ??  Interval History:  Susan Kane??is a 59 y.o.??woman??with a diagnosis of CLL??and aplastic anemia who is now s/p RIC MUD AlloSCT. Her transplant course was complicated by delayed engraftment. ??She was initially discharged from the hospital on 02/23/2020. However was readmitted on 02/27/20 with a fever and need for workup and IV cefepime. Infectious workup was unrevealing, IV cefepime was stopped on 03/02/20, and she was once again discharged on 03/03/20.      [name] presents today for routine follow up today.  No acute issues over the weekend. Still has some mild nausea at times.  Eat fairly well. Having small meals.  She has a chai latte, tomato sandwich, PBJ, baked potato with butter and sour cream in the past 24 hours or so.  She has no skin rash. No diarrhea. She has leaned more to constipation which she attributes to zofran. She has no pain. Has fatigue.  No infectious sx today. Denies CP or SOB or cough. She is with her daughter today.       Oncology History Overview Note   CLL:    Summary as per Dr. Kalman Drape most recent note with minor edits/additions from my review of records    1.??CLL?????diagnosed in August 2010, flow cytometry consistent with CLL  ?? Enlarged left??inguinal lymph node January 2019,??small??neck/axillary nodes and palpable splenomegaly 09/11/2017  ?? CTs??on 09/17/2017-3 cm necrotic appearing lymph node in the left inguinal region, borderline enlarged pelvic/retroperitoneal, chest, and axillary nodes. ??Mild splenomegaly.  ?? Ultrasound-guided biopsy of the left inguinal lymph node 09/18/2017, slightly purulent fluid aspirated, core biopsy is consistent with an atypical lymphoid proliferation???extensive necrosis with surrounding epithelioid histiocytes, limited intact lymphoid tissue involved with CLL  ?? Incisional biopsy of a necrotic/purulent left inguinal lymph node on 10/01/2017???extensive necrosis with granulomatous inflammation, small amount of viable lymphoid tissue involved with CLL, AFB and fungal stains negative  ?? Peripheral blood FISH analysis 02/05/2018??? +deletion 13q14, no evidence of p53 (17p13) deletion, no evidence of 11q22??deletion  - normal karyotype (46,XX)  ?? Bone marrow biopsy 02/26/2018???hypercellular marrow with extensive involvement by CLL, lymphocytes represent??85% of all cells  ?? Ibrutinib initiated 04/03/2018  ?? Ibrutinib placed on hold 04/11/2018 due to onset of arthralgias  ?? Ibrutinib resumed 04/16/2018, discontinued 04/25/2018 secondary to severe arthralgias/arthritis  ?? Ibrutinib resumed at a dose of 140 mg daily 05/03/2018  ?? Ibrutinib dose adjusted to 140 mg alternating with 280 mg 06/25/2018  ?? 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 ??Augmentin prescribed.  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    Baseline BM bx reviewed at [facility]  Final Diagnosis   (Outside Case #:  [id number], dated  02/26/2018)  Bone marrow, aspiration and biopsy  -  Hypercellular bone marrow (80%) with extensive involvement by chronic lymphocytic leukemia (87% lymphocytes by manual aspirate differential)  (See Comment)  -  By outside report, cytogenetic results are normal     Karyotype: 73, XX and FISH with 13q del but no 11q or 17p del    Repeat BM bx: (done after being off ibrutinib x1 month)  80% involvement by CLL    08/13/18: Presents to [facility] to discuss plan of care     Chronic lymphocytic leukemia (CLL), B-cell (CMS-HCC)   07/12/2018 Initial Diagnosis    Chronic lymphocytic leukemia (CLL), B-cell (CMS-HCC)     08/26/2018 -  Chemotherapy    Acalabrutinib started (approximate date)     11/14/2018 -  Chemotherapy    Acalabrutinib discontinued due to progressive bone marrow failure     12/06/2018 -  Chemotherapy    Ritux x1 given due to CLL still making up majority of BM cellularity (though in the setting of near aplasia of rest of TLH)     03/19/2019 -  Chemotherapy    Equine ATG / CsA for aplastic anemia (suspect 2/t acalabrutinib)     Chronic lymphocytic leukemia of B-cell type not having achieved remission (CMS-HCC)   01/21/2019 Initial Diagnosis    Chronic lymphocytic leukemia of B-cell type not having achieved remission (CMS-HCC)     01/01/2020 -  Chemotherapy    BMT IP BENDAMUSTINE / FLUDARABINE / RITUXIMAB (OP RITUXIMAB Days -13 & -6) (MUD)  bendamustine 130 mg/m2 IV on days -5, -4, -3   fludarabine 30 mg/m2 IV on days -5, -4, -3   riTUXimab 375 mg/m2 IV on day -13 as a OP treatment day, then 1,000 mg/m2 on days -6 as a OP treatment day, +1, +8   tacrolimus, 0.045 mg/kg PO BID on days -3 and -2 then  0.03 mg/kg PO BID starting on day -1   methotrexate 5 mg/m2 IV on days +1, +3, +6, +11   tbo-filgrastim 300 mcg (<= 75 kg) / 480 mcg (> 75 kg) q24h starting on day +7        Patient Active Problem List   Diagnosis   ??? Chronic lymphocytic leukemia (CLL), B-cell (CMS-HCC)   ??? Hypogammaglobulinemia (CMS-HCC)   ??? Chronic lymphocytic leukemia of B-cell type not having achieved remission (CMS-HCC)   ??? Aplastic anemia (CMS-HCC)   ??? 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 Lower abdominal pain   ??? Drug-induced nausea and vomiting   ??? Stem cell transplant candidate   ??? Chemotherapy induced neutropenia (CMS-HCC)   ??? History of allogeneic stem cell transplant (CMS-HCC)   ??? Osteopenia     Review of Systems:  A full system review was performed and was negative except as noted in the above interval history.    Reviewed and updated past medical, surgical, social, and family history as appropriate.      No Known Allergies  Current Outpatient Medications   Medication Sig Dispense Refill   ??? calcium carbonate-vitamin D3 600 mg(1,500mg ) -800 unit Tab Take 1 tablet by mouth Two (2) times a day. (Patient not taking: Reported on 03/05/2020) 60 tablet 5   ??? eltrombopag (PROMACTA) 50 MG tablet Take 2 tablets (100 mg total) by mouth daily. Administer on an empty stomach, 1 hour before or 2 hours after a meal. 60 tablet 2   ??? famotidine (PEPCID) 20 MG tablet Take 1 tablet (20 mg total) by mouth two (2)  times a day as needed for heartburn. 60 tablet 0   ??? folic acid (FOLVITE) 1 MG tablet Take 1 tablet (1 mg total) by mouth at bedtime. 30 tablet 5   ??? letermovir (PREVYMIS) 480 mg tablet Take 1 tablet (480 mg total) by mouth daily. (Patient not taking: Reported on 03/05/2020) 28 tablet 3   ??? levothyroxine (SYNTHROID) 100 MCG tablet Take 1 tablet (100 mcg total) by mouth daily. 30 tablet 5   ??? lithium (LITHOBID) 300 MG ER tablet Take 1 tablet (300 mg total) by mouth at bedtime. 30 tablet 5   ??? magnesium oxide-Mg AA chelate (MAGNESIUM, AMINO ACID CHELATE,) 133 mg Tab Take 1 tablet by mouth Three (3) times a day. 100 tablet 2   ??? nystatin (MYCOSTATIN) 100,000 unit/mL suspension Take 5 mL (500,000 Units total) by mouth Four (4) times a day. 60 mL 0   ??? OLANZapine zydis (ZYPREXA) 5 MG disintegrating tablet Take 1 tablet (5 mg total) by mouth nightly. 30 tablet 2   ??? ondansetron (ZOFRAN-ODT) 8 MG disintegrating tablet Dissolve  1 tablet (8 mg total) by mouth every eight (8) hours as needed for nausea. 30 tablet 2   ??? posaconazole (NOXAFIL) 100 mg TbEC delayed released tablet Take 3 tablets (300 mg total) by mouth daily. 90 tablet 1   ??? tacrolimus (PROGRAF) 0.5 MG capsule Take 3 capsules (1.5 mg total) by mouth two (2) times a day. 180 capsule 5   ??? TRINTELLIX 10 mg tablet Take 1 tablet (10 mg total) by mouth at bedtime. 30 tablet 5   ??? valGANciclovir (VALCYTE) 450 mg tablet Take 2 tablets (900 mg total) by mouth Two (2) times a day. 120 tablet 0   ??? zolpidem (AMBIEN) 10 mg tablet Take 1 tablet (10 mg total) by mouth nightly as needed for sleep. 20 tablet 0     No current facility-administered medications for this visit.       Physical Exam:  BP 119/71  - Pulse 90  - Temp 36.6 ??C (97.9 ??F) (Oral)  - Resp 16  - Wt 56.5 kg (124 lb 8 oz)  - LMP  (LMP Unknown)  - SpO2 97%  - BMI 20.72 kg/m??      General: No acute distress noted.   Central venous access: Line clean, dry, intact. No erythema or drainage noted.   ENT: Moist mucous membranes. Oropharhynx without lesions, erythema. Coating on tongue.   Cardiovascular: Pulse normal rate, regularity and rhythm. S1 and S2 normal, without any murmur, rub, or gallop.  Lungs: Clear to auscultation bilaterally, without wheezes/crackles/rhonchi. Good air movement.   Skin: Warm, dry, intact. No rash noted.   Psychiatry: Alert and oriented to person, place, and time.   Gastrointestinal/Abdomen: Normoactive bowel sounds, abdomen soft, non-tender   Musculoskeletal/Extremities: FROM throughout.   Neurologic: CNII-XII intact. Normal strength and sensation throughout    Karnofsky/Lansky Performance Status  80, Normal activity with effort; some signs or symptoms of disease (ECOG equivalent 1)    Test Results:   Reviewed in EPIC. Abnormal values discussed below.     Assessment/Plan:  Mrs. Mogle is 59 yo woman with CLL as well as AA/bone marrow failure.    ??  CLL:??The CT of C/A/P on 10/10/19 revealed no evidence of LAD or organomegaly. There was possible endometrial fluid or thickening in the fundus of the uterus.  The BMBx on 10/07/19 showed:   Scant bone marrow sampling (<5% cellular) essentially devoid of hematopoietic elements.  - ??Persistent chronic lymphocytic  leukemia, representing 23% of marrow cellularity by flow cytometric analysis ????????  - ??Routine cytogenetic analysis is pending.   - Corresponding CBC: WBC 1.0, ANC 0.1.??  ??  12/26/19: BMBx:   - ??Hypocellular bone marrow (20% overall) involved by chronic lymphocytic leukemia/small lymphocytic lymphoma, representing approximately 20% of marrow cells by PAX5 immunohistochemistry  - Normal karyotype: 46,XX[20]  ??  BMT:??SD (stable disease)  HCT-CI (age adjusted) 5??(active CLL, psych, age adjusted)    Conditioning:  1. Rituximab 375 mg/m2 on day -13 and 1000 mg/m2 on D-6, +1, and +8  2. Bendamustine 130 mg/m2 IV daily over 10 minutes on D-5 through -3  3. Fludarabine 30 mg/m2 IV daily over 30 minutes on D-5 through -3  ??  Donor: 10/10, ABO ), CMV negative    Engraftment: Granix starting D+12 through neutrophil recovery (as defined as ANC 1.0 x 2 days or 3.0 x 1 day)  - Viral studies showed weakly positive EBV, CMV, HHV-6, plan to monitor for now  - Day +30 BMBx: markedly hypocellular marrow (<5%) with essentially absent hematopoiesis.   - Chimerisms:       PB UF PB CD3 BM UF BM CD3   02/09/20 63% donor Insufficient CD3 cells to quantify      02/10/20   83% Donor Insufficient CD3 cells to quantify   ** Plan to repeat chimerism next week.     GvHD prophylaxis:   1. Tacrolimus goal 5-10, dose adjustments per BMT pharmacy.   ??** Current dose 1.5mg  BID  2. Completed Methotrexate 5 mg/m2 on D+1, +3, +6 and +11  3. rATG 1 mg/kg on D-2 and -1 included  ??  Heme:  Transfusion criteria: Transfuse 1 unit of PRBCs for Hgb<7 and 1 unit of platelets for Plt <10K or bleeding. No history of transfusion reactions.   - Granix given 8/9, 8/11  - Udenyca 6mg  x1 given 8/13 today WBC is 2.9  - 03/08/20: Will give 1 unit of platelets. ID:????  Prophylaxis:  - Antiviral: Valcyte as below for CMV viremia.   - Antifungal: Continue Posaconazole 300 mg daily  - Antibacterial:??ANC 0.4 today, but giving Udenyca. If ANC remains <0.5 on next lab check, will start Cefdinir.    - PJP:  S/p pentamidine on 02/21/20, can replace with Bactrim once platelets engraft. For now will continue with monthly Pentamidine. ??    CMV:  - 02/24/20: CMV 207  - 8/10 CMV 884. Valganciclovir started.  - Letermovir denied. Appeal in progress.   ??  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  EBV: negative,  - Monitor weekly, ordered for next visit.     Neutropenic fever  03/02/20 Fever at home 100.4 and low grade in ER of 99.5. ??  - Cx NGTD  - IV cefepime (8/8-8/10)  - Now afebrile.   ??  GI:??  GERD Prophylaxis: Pepcid.  This is now PRN.   ??  CINV:  - Anti-emetics per BMT protocol.   - Zofran works well for her  - Nightly zyprexa. Can consider cutting dose in half at the end of the week if doing well.     Coated Tongue: Thrush?  - Start Nystatin solution PRN  ??  Renal:   -??Cr??0.8 today,??baseline of ~ 0.7.    ??  FEN:   - Continue folic acid.    Hypomagnesemia:  - Mg 1.5,  Mag Chelate to  1 tablet TID. Gave 2 gm mag in infusion today.   ??  Endocrine:  Hypothyroid:??  - Continue Synthroid daily.   - Check TSH, T3, T4  ??  Hepatic:??  - VOD prophylaxis not warranted as non-myeloablative regimen.   ??  Hepatocellular liver injury pattern:  - Mild increase in AST, ALT, and AlkPhos, likely secondary to posaconazole. Stable today.     Endo:  - Blood sugar elevated today, 208. She had cereal and hot chocolate before her visit. If BS remains elevated at next visit will need to start monitoring and possibly start Metformin.    ??  CV:  HTN: currently not on treatment.  ??  Pulm:  AHRF: First noted on 7/22. Intermittent O2 requirement with exam findings concerning for LLL crackles. No signs of volume overload on exam. Chest-X ray clear. Now on RA.     Neuro/Pain:??  - No c/o at this time    Psych:??Has a home psychiatrist.   Depression/Anxiety:   - Per outpt note pt was taking Xanax 0.25 mg BID PRN for anxiety but this was not on her home med list thus I did not re-order at this time. Discussed with patient who stated she rarely takes.  - Continue Lithium 300mg  nightly  - Trintellix 10mg  nightly  - Psych consult PRN.   ??  Insomnia:  - Ambien 10mg  nightly PRN  ??  Caregiver/Lodging:  - Husband [name] will be primary caregiver  - Staying at local rental.     I personally spent 45 minutes face-to-face and non-face-to-face in the care of this patient, which includes all pre, intra, and post visit time on the date of service.    Dimple Nanas, ANP  [facility]

## 2020-03-09 NOTE — Unmapped (Signed)
Bone Marrow Transplant and Cellular Therapy Program  Immunosuppressive Therapy Note    Carolyna Yerian is a 59 y.o. 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 Ms. Kaseman is currently day +55 (as of 8/17).    Current dose: tacrolimus 1.5 mg PO BID    Goal tacrolimus Level: 5-10 ng/mL    Resulted level: 5.2 ng/mL drawn at 1:49 PM on 8/16, resulted 8/17    Lab Results   Component Value Date/Time    TACROLIMUS 5.2 03/08/2020 01:49 PM    TACROLIMUS 6.0 03/05/2020 10:54 AM    TACROLIMUS 7.8 03/02/2020 08:01 AM    TACROLIMUS 6.2 02/27/2020 12:30 PM     Lab Results   Component Value Date/Time    CREATININE 0.81 (H) 03/08/2020 01:49 PM    CREATININE 0.87 (H) 03/07/2020 08:51 AM    CREATININE 1.00 (H) 03/05/2020 10:54 AM    CREATININE 0.82 (H) 03/03/2020 12:09 AM      Assessment/Recommendation: Tacrolimus level is therapeutic. Level was drawn a few hours late, so true tough is likely higher and closer to upper end of target goal range. Her SCr is improving. ALT is mildly elevated, but improving. TBili/AST WNL. Given the above, plan to continue current tacrolimus dose of 1.5 mg PO BID and will recheck at next clinic visit to further assess trend and need for adjustment. Her next appointment is tomorrow at 8 AM and therefore, tacrolimus level should be a true trough for evaluation tomorrow.    We will continue to monitor levels.  Patient will be followed for changes in renal and hepatic function, toxicity, and efficacy.     Berneta Levins, CPP   [facility]

## 2020-03-10 ENCOUNTER — Ambulatory Visit: Admit: 2020-03-10 | Discharge: 2020-03-11 | Payer: PRIVATE HEALTH INSURANCE

## 2020-03-10 ENCOUNTER — Encounter: Admit: 2020-03-10 | Discharge: 2020-03-11 | Payer: PRIVATE HEALTH INSURANCE

## 2020-03-10 ENCOUNTER — Encounter
Admit: 2020-03-10 | Discharge: 2020-03-11 | Payer: PRIVATE HEALTH INSURANCE | Attending: Pharmacist Clinician (PhC)/ Clinical Pharmacy Specialist | Primary: Pharmacist Clinician (PhC)/ Clinical Pharmacy Specialist

## 2020-03-10 DIAGNOSIS — B1910 Unspecified viral hepatitis B without hepatic coma: Secondary | ICD-10-CM | POA: Diagnosis not present

## 2020-03-10 DIAGNOSIS — Z9221 Personal history of antineoplastic chemotherapy: Secondary | ICD-10-CM | POA: Diagnosis not present

## 2020-03-10 DIAGNOSIS — T451X5D Adverse effect of antineoplastic and immunosuppressive drugs, subsequent encounter: Secondary | ICD-10-CM | POA: Diagnosis not present

## 2020-03-10 DIAGNOSIS — F329 Major depressive disorder, single episode, unspecified: Secondary | ICD-10-CM | POA: Diagnosis not present

## 2020-03-10 DIAGNOSIS — E039 Hypothyroidism, unspecified: Secondary | ICD-10-CM | POA: Diagnosis not present

## 2020-03-10 DIAGNOSIS — Z48290 Encounter for aftercare following bone marrow transplant: Secondary | ICD-10-CM | POA: Diagnosis not present

## 2020-03-10 DIAGNOSIS — R112 Nausea with vomiting, unspecified: Secondary | ICD-10-CM | POA: Diagnosis not present

## 2020-03-10 DIAGNOSIS — B370 Candidal stomatitis: Secondary | ICD-10-CM | POA: Diagnosis not present

## 2020-03-10 DIAGNOSIS — F419 Anxiety disorder, unspecified: Secondary | ICD-10-CM | POA: Diagnosis not present

## 2020-03-10 DIAGNOSIS — I10 Essential (primary) hypertension: Secondary | ICD-10-CM | POA: Diagnosis not present

## 2020-03-10 DIAGNOSIS — D801 Nonfamilial hypogammaglobulinemia: Secondary | ICD-10-CM | POA: Diagnosis not present

## 2020-03-10 DIAGNOSIS — Z79899 Other long term (current) drug therapy: Secondary | ICD-10-CM | POA: Diagnosis not present

## 2020-03-10 DIAGNOSIS — D693 Immune thrombocytopenic purpura: Secondary | ICD-10-CM | POA: Diagnosis not present

## 2020-03-10 DIAGNOSIS — C9110 Chronic lymphocytic leukemia of B-cell type not having achieved remission: Secondary | ICD-10-CM | POA: Diagnosis not present

## 2020-03-10 DIAGNOSIS — D619 Aplastic anemia, unspecified: Secondary | ICD-10-CM | POA: Diagnosis not present

## 2020-03-10 DIAGNOSIS — Z9484 Stem cells transplant status: Secondary | ICD-10-CM | POA: Diagnosis not present

## 2020-03-10 DIAGNOSIS — K5909 Other constipation: Secondary | ICD-10-CM | POA: Diagnosis not present

## 2020-03-10 DIAGNOSIS — B259 Cytomegaloviral disease, unspecified: Secondary | ICD-10-CM | POA: Diagnosis not present

## 2020-03-10 DIAGNOSIS — M8580 Other specified disorders of bone density and structure, unspecified site: Principal | ICD-10-CM

## 2020-03-10 DIAGNOSIS — T451X5A Adverse effect of antineoplastic and immunosuppressive drugs, initial encounter: Secondary | ICD-10-CM

## 2020-03-10 DIAGNOSIS — R5383 Other fatigue: Principal | ICD-10-CM

## 2020-03-10 LAB — CBC W/ AUTO DIFF
BASOPHILS ABSOLUTE COUNT: 0 10*9/L (ref 0.0–0.1)
BASOPHILS RELATIVE PERCENT: 0.3 %
EOSINOPHILS RELATIVE PERCENT: 0.4 %
HEMATOCRIT: 23.1 % — ABNORMAL LOW (ref 36.0–46.0)
LARGE UNSTAINED CELLS: 8 % — ABNORMAL HIGH (ref 0–4)
LYMPHOCYTES ABSOLUTE COUNT: 0.7 10*9/L — ABNORMAL LOW (ref 1.5–5.0)
LYMPHOCYTES RELATIVE PERCENT: 25.8 %
MEAN CORPUSCULAR HEMOGLOBIN CONC: 35.9 g/dL (ref 31.0–37.0)
MEAN CORPUSCULAR HEMOGLOBIN: 29.1 pg (ref 26.0–34.0)
MEAN CORPUSCULAR VOLUME: 80.9 fL (ref 80.0–100.0)
MEAN PLATELET VOLUME: 10.8 fL — ABNORMAL HIGH (ref 7.0–10.0)
MONOCYTES ABSOLUTE COUNT: 0.2 10*9/L (ref 0.2–0.8)
MONOCYTES RELATIVE PERCENT: 6.3 %
NEUTROPHILS ABSOLUTE COUNT: 1.6 10*9/L — ABNORMAL LOW (ref 2.0–7.5)
NEUTROPHILS RELATIVE PERCENT: 59.6 %
PLATELET COUNT: 6 10*9/L — CL (ref 150–440)
RED BLOOD CELL COUNT: 2.85 10*12/L — ABNORMAL LOW (ref 4.00–5.20)
RED CELL DISTRIBUTION WIDTH: 14.4 % (ref 12.0–15.0)
WBC ADJUSTED: 2.7 10*9/L — ABNORMAL LOW (ref 4.5–11.0)

## 2020-03-10 LAB — PLATELET COUNT
Platelets:NCnc:Pt:Bld:Qn:Automated count: 13 — ABNORMAL LOW
Platelets:NCnc:Pt:Bld:Qn:Automated count: 24 — ABNORMAL LOW

## 2020-03-10 LAB — LACTATE DEHYDROGENASE: Lactate dehydrogenase:CCnc:Pt:Ser/Plas:Qn:Reaction: pyruvate to lactate: 223

## 2020-03-10 LAB — COMPREHENSIVE METABOLIC PANEL
ALBUMIN: 3.6 g/dL (ref 3.4–5.0)
ALKALINE PHOSPHATASE: 162 U/L — ABNORMAL HIGH (ref 46–116)
ALT (SGPT): 36 U/L (ref 10–49)
ANION GAP: 5 mmol/L (ref 5–14)
AST (SGOT): 15 U/L (ref <=34)
BILIRUBIN TOTAL: 0.9 mg/dL (ref 0.3–1.2)
BLOOD UREA NITROGEN: 19 mg/dL (ref 9–23)
BUN / CREAT RATIO: 22
CALCIUM: 9.5 mg/dL (ref 8.7–10.4)
CHLORIDE: 106 mmol/L (ref 98–107)
CO2: 29 mmol/L (ref 20.0–31.0)
EGFR CKD-EPI NON-AA FEMALE: 72 mL/min/{1.73_m2} (ref >=60)
GLUCOSE RANDOM: 141 mg/dL (ref 70–179)
POTASSIUM: 3.8 mmol/L (ref 3.4–4.5)
PROTEIN TOTAL: 6 g/dL (ref 5.7–8.2)
SODIUM: 140 mmol/L (ref 135–145)

## 2020-03-10 LAB — CALCIUM: Calcium:MCnc:Pt:Ser/Plas:Qn:: 9.5

## 2020-03-10 LAB — EBV VIRAL LOAD RESULT: Lab: NOT DETECTED

## 2020-03-10 LAB — SLIDE REVIEW

## 2020-03-10 LAB — TOXIC VACUOLATION

## 2020-03-10 LAB — MEAN CORPUSCULAR HEMOGLOBIN: Erythrocyte mean corpuscular hemoglobin:EntMass:Pt:RBC:Qn:Automated count: 29.1

## 2020-03-10 LAB — TACROLIMUS, TROUGH: Lab: 7.6

## 2020-03-10 LAB — MAGNESIUM: Magnesium:MCnc:Pt:Ser/Plas:Qn:: 1.7

## 2020-03-10 MED ORDER — ONDANSETRON 8 MG DISINTEGRATING TABLET
ORAL_TABLET | Freq: Three times a day (TID) | ORAL | 2 refills | 20 days | Status: CP | PRN
Start: 2020-03-10 — End: ?
  Filled 2020-03-10: qty 60, 20d supply, fill #0

## 2020-03-10 MED ADMIN — heparin, porcine (PF) 100 unit/mL injection 200 Units: 200 [IU] | INTRAVENOUS | @ 17:00:00 | Stop: 2020-03-11

## 2020-03-10 MED ADMIN — zoledronic acid (ZOMETA) 4 mg in sodium chloride (NS) 0.9 % 100 mL IVPB: 4 mg | INTRAVENOUS | @ 14:00:00 | Stop: 2020-03-10

## 2020-03-10 MED FILL — ONDANSETRON 8 MG DISINTEGRATING TABLET: 20 days supply | Qty: 60 | Fill #0 | Status: AC

## 2020-03-10 NOTE — Unmapped (Signed)
Patient tolerated transfusion of Platelets x 2 without complication, visit uneventful. CVC hep locked and clamped; no sign of infiltration. No questions/concerns.

## 2020-03-10 NOTE — Unmapped (Signed)
Hospital Outpatient Visit on 03/10/2020   Component Date Value Ref Range Status   ??? Unit Blood Type 03/10/2020 O Neg   Final   ??? ISBT Number 03/10/2020 9500   Final   ??? Unit # 03/10/2020 [id number]   Final   ??? Status 03/10/2020 Issued   Final   ??? Product ID 03/10/2020 Platelets   Final   ??? PRODUCT CODE 03/10/2020 [id number]   Final   Lab on 03/10/2020   Component Date Value Ref Range Status   ??? Sodium 03/10/2020 140  135 - 145 mmol/L Final   ??? Potassium 03/10/2020 3.8  3.4 - 4.5 mmol/L Final   ??? Chloride 03/10/2020 106  98 - 107 mmol/L Final   ??? Anion Gap 03/10/2020 5  5 - 14 mmol/L Final   ??? CO2 03/10/2020 29.0  20.0 - 31.0 mmol/L Final   ??? BUN 03/10/2020 19  9 - 23 mg/dL Final   ??? Creatinine 03/10/2020 0.88* 0.60 - 0.80 mg/dL Final   ??? BUN/Creatinine Ratio 03/10/2020 22   Final   ??? EGFR CKD-EPI Non-African American,* 03/10/2020 72  >=60 mL/min/1.25m2 Final   ??? EGFR CKD-EPI African American, Fem* 03/10/2020 83  >=60 mL/min/1.4m2 Final   ??? Glucose 03/10/2020 141  70 - 179 mg/dL Final   ??? Calcium 73/22/0254 9.5  8.7 - 10.4 mg/dL Final   ??? Albumin 27/12/2374 3.6  3.4 - 5.0 g/dL Final   ??? Total Protein 03/10/2020 6.0  5.7 - 8.2 g/dL Final   ??? Total Bilirubin 03/10/2020 0.9  0.3 - 1.2 mg/dL Final   ??? AST 28/31/5176 15  <=34 U/L Final   ??? ALT 03/10/2020 36  10 - 49 U/L Final   ??? Alkaline Phosphatase 03/10/2020 162* 46 - 116 U/L Final   ??? Magnesium 03/10/2020 1.7  1.6 - 2.6 mg/dL Final   ??? LDH 16/01/3709 223  120 - 246 U/L Final   ??? WBC 03/10/2020 2.7* 4.5 - 11.0 10*9/L Final   ??? RBC 03/10/2020 2.85* 4.00 - 5.20 10*12/L Final   ??? HGB 03/10/2020 8.3* 12.0 - 16.0 g/dL Final   ??? HCT 62/69/4854 23.1* 36.0 - 46.0 % Final   ??? MCV 03/10/2020 80.9  80.0 - 100.0 fL Final   ??? MCH 03/10/2020 29.1  26.0 - 34.0 pg Final   ??? MCHC 03/10/2020 35.9  31.0 - 37.0 g/dL Final   ??? RDW 62/70/3500 14.4  12.0 - 15.0 % Final   ??? MPV 03/10/2020 10.8* 7.0 - 10.0 fL Final   ??? Platelet 03/10/2020 6* 150 - 440 10*9/L Final   ??? Variable HGB Concentration 03/10/2020 Slight* Not Present Final   ??? Neutrophils % 03/10/2020 59.6  % Final   ??? Lymphocytes % 03/10/2020 25.8  % Final   ??? Monocytes % 03/10/2020 6.3  % Final   ??? Eosinophils % 03/10/2020 0.4  % Final   ??? Basophils % 03/10/2020 0.3  % Final   ??? Absolute Neutrophils 03/10/2020 1.6* 2.0 - 7.5 10*9/L Final   ??? Absolute Lymphocytes 03/10/2020 0.7* 1.5 - 5.0 10*9/L Final   ??? Absolute Monocytes 03/10/2020 0.2  0.2 - 0.8 10*9/L Final   ??? Absolute Eosinophils 03/10/2020 0.0  0.0 - 0.4 10*9/L Final   ??? Absolute Basophils 03/10/2020 0.0  0.0 - 0.1 10*9/L Final   ??? Large Unstained Cells 03/10/2020 8* 0 - 4 % Final   ??? Microcytosis 03/10/2020 Slight* Not Present Final   ??? Hyperchromasia 03/10/2020 Slight* Not Present Final

## 2020-03-10 NOTE — Unmapped (Signed)
[facility] Transplant [facility] Cellular Therapy Program  Immunosuppressive Therapy Note    Susan Kane is a 59 y.o. 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 Ms. Baine is currently day +56.    Current dose: tacrolimus 1.5 mg PO BID    Goal tacrolimus Level: 5-10 ng/mL    Resulted level: 7.6 ng/mL and a true trough     Lab Results   Component Value Date/Time    TACROLIMUS 7.6 03/10/2020 07:31 AM    TACROLIMUS 5.2 03/08/2020 01:49 PM    TACROLIMUS 6.0 03/05/2020 10:54 AM    TACROLIMUS 7.8 03/02/2020 08:01 AM     Lab Results   Component Value Date/Time    CREATININE 0.88 (H) 03/10/2020 07:31 AM    CREATININE 0.81 (H) 03/08/2020 01:49 PM    CREATININE 0.87 (H) 03/07/2020 08:51 AM    CREATININE 1.00 (H) 03/05/2020 10:54 AM      Assessment/Recommendation: Tacrolimus level is therapeutic. Her SCr is overall stable. Alk phos is mildly elevated, but improving. TBili/AST WNL. Given the above, plan to continue current tacrolimus dose of 1.5 mg PO BID and will recheck at next clinic visit to further assess trend and need for adjustment.     We will continue to monitor levels.  Patient will be followed for changes in renal and hepatic function, toxicity, and efficacy.     Rulon Abide, PharmD CPP  [facility]

## 2020-03-10 NOTE — Unmapped (Signed)
We will give a dose of platelets today.  You will get first dose of Zometa for decreased bone density.   I have ordered a dose of lactulose while in infusion to help with constipation.  Other labs look great today.   We will need to keep the Valcyte at current dose another week and then can cut dose in half. This will hamper your count recovery.        Recent Results (from the past 24 hour(s))   Prepare Platelet Pheresis    Collection Time: 03/09/20  7:00 PM   Result Value Ref Range    Unit Blood Type A Neg     ISBT Number 0600     Unit # [id number]     Status Transfused     Product ID Platelets     PRODUCT CODE [id number]    Comprehensive Metabolic Panel    Collection Time: 03/10/20  7:31 AM   Result Value Ref Range    Sodium 140 135 - 145 mmol/L    Potassium 3.8 3.4 - 4.5 mmol/L    Chloride 106 98 - 107 mmol/L    Anion Gap 5 5 - 14 mmol/L    CO2 29.0 20.0 - 31.0 mmol/L    BUN 19 9 - 23 mg/dL    Creatinine 1.19 (H) 0.60 - 0.80 mg/dL    BUN/Creatinine Ratio 22     EGFR CKD-EPI Non-African American, Female 72 >=60 mL/min/1.53m2    EGFR CKD-EPI African American, Female 19 >=60 mL/min/1.18m2    Glucose 141 70 - 179 mg/dL    Calcium 9.5 8.7 - 14.7 mg/dL    Albumin 3.6 3.4 - 5.0 g/dL    Total Protein 6.0 5.7 - 8.2 g/dL    Total Bilirubin 0.9 0.3 - 1.2 mg/dL    AST 15 <=82 U/L    ALT 36 10 - 49 U/L    Alkaline Phosphatase 162 (H) 46 - 116 U/L   Magnesium Level    Collection Time: 03/10/20  7:31 AM   Result Value Ref Range    Magnesium 1.7 1.6 - 2.6 mg/dL   Lactate dehydrogenase    Collection Time: 03/10/20  7:31 AM   Result Value Ref Range    LDH 223 120 - 246 U/L   CBC w/ Differential    Collection Time: 03/10/20  7:31 AM   Result Value Ref Range    WBC 2.7 (L) 4.5 - 11.0 10*9/L    RBC 2.85 (L) 4.00 - 5.20 10*12/L    HGB 8.3 (L) 12.0 - 16.0 g/dL    HCT 95.6 (L) 21.3 - 46.0 %    MCV 80.9 80.0 - 100.0 fL    MCH 29.1 26.0 - 34.0 pg    MCHC 35.9 31.0 - 37.0 g/dL    RDW 08.6 57.8 - 46.9 %    MPV 10.8 (H) 7.0 - 10.0 fL Platelet 6 (LL) 150 - 440 10*9/L    Variable HGB Concentration Slight (A) Not Present    Neutrophils % 59.6 %    Lymphocytes % 25.8 %    Monocytes % 6.3 %    Eosinophils % 0.4 %    Basophils % 0.3 %    Absolute Neutrophils 1.6 (L) 2.0 - 7.5 10*9/L    Absolute Lymphocytes 0.7 (L) 1.5 - 5.0 10*9/L    Absolute Monocytes 0.2 0.2 - 0.8 10*9/L    Absolute Eosinophils 0.0 0.0 - 0.4 10*9/L    Absolute Basophils 0.0 0.0 - 0.1 10*9/L  Large Unstained Cells 8 (H) 0 - 4 %    Microcytosis Slight (A) Not Present    Hyperchromasia Slight (A) Not Present   Prepare Platelet Pheresis    Collection Time: 03/10/20  8:35 AM   Result Value Ref Range    Unit Blood Type O Neg     ISBT Number 9500     Unit # [id number]     Status Ready     Product ID Platelets     PRODUCT CODE [id number]

## 2020-03-10 NOTE — Unmapped (Cosign Needed)
Pharmacy - Bone Health Assessment     Patient Name/ID: Susan Kane  MRN: [id number]  DOB: [birth year]    Encounter Date: 03/10/2020    BMT Attending MD: Dr. Oswald Hillock    Patient/Caregiver Phone:  [phone] (home)       Reason for Visit:  Susan Kane is a 59 y.o. year old female with history of CLL and aplastic anemia who is currently day +56 s/p RIC MUD allogeneic stem cell transplant.  Patient presents to clinic today accompanied by her daughter for bone health assessment in response to a routine DEXA scan results.     03/05/20: DEXA scan results:  - spine L1 - L4 T score -1.4  - L proximal femur T score -1.9  - L femoral neck T score -1.8    Chemistries  Lab Results   Component Value Date    NA 140 03/10/2020    K 3.8 03/10/2020    CL 106 03/10/2020    CO2 29.0 03/10/2020    BUN 19 03/10/2020    CREATININE 0.88 (H) 03/10/2020    GLU 141 03/10/2020    MG 1.7 03/10/2020    CALCIUM 9.5 03/10/2020    PHOS 3.6 03/01/2020    AST 15 03/10/2020    ALT 36 03/10/2020    ALKPHOS 162 (H) 03/10/2020       Current Medications:  Current Outpatient Medications   Medication Sig Dispense Refill   ??? calcium carbonate-vitamin D3 600 mg(1,500mg ) -800 unit Tab Take 1 tablet by mouth Two (2) times a day. (Patient not taking: Reported on 03/05/2020) 60 tablet 5   ??? eltrombopag (PROMACTA) 50 MG tablet Take 2 tablets (100 mg total) by mouth daily. Administer on an empty stomach, 1 hour before or 2 hours after a meal. 60 tablet 2   ??? famotidine (PEPCID) 20 MG tablet Take 1 tablet (20 mg total) by mouth two (2) times a day as needed for heartburn. 60 tablet 0   ??? folic acid (FOLVITE) 1 MG tablet Take 1 tablet (1 mg total) by mouth at bedtime. 30 tablet 5   ??? letermovir (PREVYMIS) 480 mg tablet Take 1 tablet (480 mg total) by mouth daily. (Patient not taking: Reported on 03/05/2020) 28 tablet 3   ??? levothyroxine (SYNTHROID) 100 MCG tablet Take 1 tablet (100 mcg total) by mouth daily. 30 tablet 5   ??? lithium (LITHOBID) 300 MG ER tablet Take 1 tablet (300 mg total) by mouth at bedtime. 30 tablet 5   ??? magnesium oxide-Mg AA chelate (MAGNESIUM, AMINO ACID CHELATE,) 133 mg Tab Take 1 tablet by mouth Three (3) times a day. 100 tablet 2   ??? nystatin (MYCOSTATIN) 100,000 unit/mL suspension Take 5 mL (500,000 Units total) by mouth Four (4) times a day. 60 mL 0   ??? OLANZapine zydis (ZYPREXA) 5 MG disintegrating tablet Take 1 tablet (5 mg total) by mouth nightly. 30 tablet 2   ??? ondansetron (ZOFRAN-ODT) 8 MG disintegrating tablet Dissolve 1 tablet (8 mg total) in mouth every eight (8) hours as needed for nausea. 60 tablet 2   ??? posaconazole (NOXAFIL) 100 mg TbEC delayed released tablet Take 3 tablets (300 mg total) by mouth daily. 90 tablet 1   ??? tacrolimus (PROGRAF) 0.5 MG capsule Take 3 capsules (1.5 mg total) by mouth two (2) times a day. 180 capsule 5   ??? TRINTELLIX 10 mg tablet Take 1 tablet (10 mg total) by mouth at bedtime. 30 tablet 5   ??? valGANciclovir (VALCYTE)  450 mg tablet Take 2 tablets (900 mg total) by mouth Two (2) times a day. 120 tablet 0   ??? zolpidem (AMBIEN) 10 mg tablet Take 1 tablet (10 mg total) by mouth nightly as needed for sleep. 20 tablet 0     No current facility-administered medications for this visit.     Facility-Administered Medications Ordered in Other Visits   Medication Dose Route Frequency Provider Last Rate Last Admin   ??? heparin, porcine (PF) 100 unit/mL injection 200 Units  200 Units Intravenous Q30 Min PRN Pernell Dupre, MD   200 Units at 03/10/20 0731     Assessment / Plan:  - based on DEXA scan, patient has osteopenia of spine, femur and femoral neck. Per current protocol, would recommend administering zoledronic acid 4 mg IV every 3 months x 2 doses in addition to calcium and vitamin D supplementation. Patient already started calcium and vitamin D supplement 600 mg/ 800 units twice daily and is tolerating it well aside from some constipation. Instructed patient to start senna-docusate 1 tablet at bedtime and may use miralax as needed for relief.  Educated patient about possible side effect of zolendronic acid including: fevers, chills, weakness, muscle and joint aches, stomach upset and kidney insufficiency. Also, counseled patient of rare yet potentially severe side effect of osteonecrosis of the jaw. Patient completed dental clearance prior to transplant and no mayor dental work done since then  - we also discussed importance of weight bearing exercise like walking in maintaining bone health   - baseline renal function and calcium levels within normal limits.  - Plan to monitor serum calcium and creatinine levels while receiving zometa      I spent 25 minutes in direct patient care with this patient    Rulon Abide, PharmD, BCOP  Clinical Pharmacist Practitioner, BMT      Supporting literature for use of zoledronic acid in recipients of allogeneic-SCT includes:    According to a peer-reviewed study published by Gwenith Spitz al., intravenous zoledronic acid significantly increases bone mineral density (BMD) and reduces deoxypyridinoline (Dpd) excretion in patients with bone loss (i.e. osteopenia) after an allogeneic stem cell transplant. Patients in this study were administered zoledronic acid 4mg  IV every 3 months for 2 years. BMD was measured by a DEXA scan (LS lumbar spine, FH femur hip) and Dpd/calcium excretion was assessed by longitudinal measurements. BMD was shown to significantly increase with treatment with zoledronic acid (LS baseline???2 yrs, ? + 11.6 ?? 6.0%, P < 0.001; FH baseline???2 yrs, ? + 7.5 ?? 7.0%, P < 0.001). Dpd decreased from 6.2 ?? 4.6 to 3.6 ?? 3.2 after 6yr (P=0.009) and 3.2 ?? 1.4 after 19yrs (P=0.04).1 Similarly, a study conducted by D???Chales Abrahams et al. demonstrated that zoledronic acid can reduce bone loss in allogeneic stem cell transplant recipients. Total hip BMD and femoral neck BMD was shown to increase by a median of +3.3% (range, 220.4 to +14.8%) and +1.4% (range, 222.2 to +33.6%), respectively.2 Furthermore, Hari et al. performed a multicenter, randomized open-label trial of zoledronic acid to prevent BMD loss in allogenic stem cell transplant recipients with osteopenia prior to transplant. Zoledronic acid demonstrated to significantly increase BMD at the femoral neck (P=0.04) and decrease urine N-telopeptide (P=0.04). The authors concluded that zoledronic acid is a safe and efficacious agent in improving bone health in patients are at risk for osteoporosis after receiving an allogeneic stem cell transplant.3 Taking into consideration this data, [facility] has developed an evidence-based clinical practice protocol to administer  intravenous zoledronic acid to allogeneic stem cell transplant recipients who are at risk for osteoporosis.      1. Hausmann A, Hill W, Stemmler HJ, et al. Bone loss after allogeneic haematopoietic stem cell transplantation: a pilot study on the use of zoledronic Acid. Chemother Res Pract. 2012;2012:858590.  2. D???Velna Ochs, Grigg AP, Szer J, [place]. Zoledronic acid prevents bone loss after allogeneic haemopoietic stem cell transplantation. Intern Med J. 2006;36(9):600-603.  3. Hari P, DeFor TE, Vesole DH, Bredeson CN, [place]. Intermittent zoledronic Acid prevents bone loss in adults after allogeneic hematopoietic cell transplantation. Biol Blood Marrow Transplant. 2013;19(9):1361-1367.

## 2020-03-10 NOTE — Unmapped (Signed)
BMT Clinic Follow-up    Patient Name: Susan Kane  MRN: [id number]  Encounter date: 03/10/2020    Referring Physician: Dr. Jillyn Hidden Nida Boatman) [name]  Primary Care Provider: Thora Lance, MD  BMT Attending MD: Dr. Oswald Hillock    Disease: CLL  Current disease status: SD (stable disease)  Type of Transplant: RIC MUD Allo  Graft Source: Cryopreserved PBSCs  Transplant Day: 20     Study Participant: BMT CTN 1704: Composite Health Assessment Risk Model for Older Adults: Applying Pre-transplant Comorbidity, Geriatric Assessment and Biomarkers to Predict Non-Relapse Mortality After Allogeneic Transplant (CHARM).  ??  Interval History:  Susan Kane??is a 59 y.o.??woman??with a diagnosis of CLL??and aplastic anemia who is now s/p RIC MUD AlloSCT. Her transplant course was complicated by delayed engraftment. ??She was initially discharged from the hospital on 02/23/2020. However was readmitted on 02/27/20 with a fever and need for workup and IV cefepime. Infectious workup was unrevealing, IV cefepime was stopped on 03/02/20, and she was once again discharged on 03/03/20.      Susan Kane presents today for routine follow up today. She is here today with her daughter.  Yesterday had vomiting but was otherwise uneventful.  Taking zofran BID before pills. She had platelets at last visit. PPC was 12k.  Still has low appetite but is eating small meals.  Has some oral thrush but has not started mouthwash solution that has been ordered.    No cough or SOB.  Constipated and is now day 3 of no BM.  Took miralax once dose yesterday and a dose of colace. Skin is ok. No rash. Energy is low. Sleeping OK. No pain currently. Afebrile.    Oncology History Overview Note   CLL:    Summary as per Dr. Kalman Drape most recent note with minor edits/additions from my review of records    1.??CLL?????diagnosed in August 2010, flow cytometry consistent with CLL  ?? Enlarged left??inguinal lymph node January 2019,??small??neck/axillary nodes and palpable splenomegaly 09/11/2017  ?? CTs??on 09/17/2017-3 cm necrotic appearing lymph node in the left inguinal region, borderline enlarged pelvic/retroperitoneal, chest, and axillary nodes. ??Mild splenomegaly.  ?? Ultrasound-guided biopsy of the left inguinal lymph node 09/18/2017, slightly purulent fluid aspirated, core biopsy is consistent with an atypical lymphoid proliferation???extensive necrosis with surrounding epithelioid histiocytes, limited intact lymphoid tissue involved with CLL  ?? Incisional biopsy of a necrotic/purulent left inguinal lymph node on 10/01/2017???extensive necrosis with granulomatous inflammation, small amount of viable lymphoid tissue involved with CLL, AFB and fungal stains negative  ?? Peripheral blood FISH analysis 02/05/2018??? +deletion 13q14, no evidence of p53 (17p13) deletion, no evidence of 11q22??deletion  - normal karyotype (46,XX)  ?? Bone marrow biopsy 02/26/2018???hypercellular marrow with extensive involvement by CLL, lymphocytes represent??85% of all cells  ?? Ibrutinib initiated 04/03/2018  ?? Ibrutinib placed on hold 04/11/2018 due to onset of arthralgias  ?? Ibrutinib resumed 04/16/2018, discontinued 04/25/2018 secondary to severe arthralgias/arthritis  ?? Ibrutinib resumed at a dose of 140 mg daily 05/03/2018  ?? Ibrutinib dose adjusted to 140 mg alternating with 280 mg 06/25/2018  ?? 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 ??Augmentin prescribed.  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    Baseline BM bx reviewed at [facility]  Final Diagnosis   (  Outside Case #:  [id number], dated 02/26/2018)  Bone marrow, aspiration and biopsy  -  Hypercellular bone marrow (80%) with extensive involvement by chronic lymphocytic leukemia (87% lymphocytes by manual aspirate differential)  (See Comment)  -  By outside report, cytogenetic results are normal     Karyotype: 4, XX and FISH with 13q del but no 11q or 17p del    Repeat BM bx: (done after being off ibrutinib x1 month)  80% involvement by CLL    08/13/18: Presents to [facility] to discuss plan of care     Chronic lymphocytic leukemia (CLL), B-cell (CMS-HCC)   07/12/2018 Initial Diagnosis    Chronic lymphocytic leukemia (CLL), B-cell (CMS-HCC)     08/26/2018 -  Chemotherapy    Acalabrutinib started (approximate date)     11/14/2018 -  Chemotherapy    Acalabrutinib discontinued due to progressive bone marrow failure     12/06/2018 -  Chemotherapy    Ritux x1 given due to CLL still making up majority of BM cellularity (though in the setting of near aplasia of rest of TLH)     03/19/2019 -  Chemotherapy    Equine ATG / CsA for aplastic anemia (suspect 2/t acalabrutinib)     Chronic lymphocytic leukemia of B-cell type not having achieved remission (CMS-HCC)   01/21/2019 Initial Diagnosis    Chronic lymphocytic leukemia of B-cell type not having achieved remission (CMS-HCC)     01/01/2020 -  Chemotherapy    BMT IP BENDAMUSTINE / FLUDARABINE / RITUXIMAB (OP RITUXIMAB Days -13 & -6) (MUD)  bendamustine 130 mg/m2 IV on days -5, -4, -3   fludarabine 30 mg/m2 IV on days -5, -4, -3   riTUXimab 375 mg/m2 IV on day -13 as a OP treatment day, then 1,000 mg/m2 on days -6 as a OP treatment day, +1, +8   tacrolimus, 0.045 mg/kg PO BID on days -3 and -2 then  0.03 mg/kg PO BID starting on day -1   methotrexate 5 mg/m2 IV on days +1, +3, +6, +11   tbo-filgrastim 300 mcg (<= 75 kg) / 480 mcg (> 75 kg) q24h starting on day +7        Patient Active Problem List   Diagnosis   ??? Chronic lymphocytic leukemia (CLL), B-cell (CMS-HCC)   ??? Hypogammaglobulinemia (CMS-HCC)   ??? Chronic lymphocytic leukemia of B-cell type not having achieved remission (CMS-HCC)   ??? Aplastic anemia (CMS-HCC)   ??? 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 Lower abdominal pain   ??? Drug-induced nausea and vomiting   ??? Stem cell transplant candidate   ??? Chemotherapy induced neutropenia (CMS-HCC)   ??? History of allogeneic stem cell transplant (CMS-HCC)   ??? Osteopenia     Review of Systems:  A full system review was performed and was negative except as noted in the above interval history.    Reviewed and updated past medical, surgical, social, and family history as appropriate.      No Known Allergies  Current Outpatient Medications   Medication Sig Dispense Refill   ??? calcium carbonate-vitamin D3 600 mg(1,500mg ) -800 unit Tab Take 1 tablet by mouth Two (2) times a day. (Patient not taking: Reported on 03/05/2020) 60 tablet 5   ??? eltrombopag (PROMACTA) 50 MG tablet Take 2 tablets (100 mg total) by mouth daily. Administer on an empty stomach, 1 hour before or 2 hours after a meal. 60 tablet 2   ??? famotidine (PEPCID) 20 MG tablet Take 1 tablet (20  mg total) by mouth two (2) times a day as needed for heartburn. 60 tablet 0   ??? folic acid (FOLVITE) 1 MG tablet Take 1 tablet (1 mg total) by mouth at bedtime. 30 tablet 5   ??? letermovir (PREVYMIS) 480 mg tablet Take 1 tablet (480 mg total) by mouth daily. (Patient not taking: Reported on 03/05/2020) 28 tablet 3   ??? levothyroxine (SYNTHROID) 100 MCG tablet Take 1 tablet (100 mcg total) by mouth daily. 30 tablet 5   ??? lithium (LITHOBID) 300 MG ER tablet Take 1 tablet (300 mg total) by mouth at bedtime. 30 tablet 5   ??? magnesium oxide-Mg AA chelate (MAGNESIUM, AMINO ACID CHELATE,) 133 mg Tab Take 1 tablet by mouth Three (3) times a day. 100 tablet 2   ??? nystatin (MYCOSTATIN) 100,000 unit/mL suspension Take 5 mL (500,000 Units total) by mouth Four (4) times a day. 60 mL 0   ??? OLANZapine zydis (ZYPREXA) 5 MG disintegrating tablet Take 1 tablet (5 mg total) by mouth nightly. 30 tablet 2   ??? ondansetron (ZOFRAN-ODT) 8 MG disintegrating tablet Dissolve  1 tablet (8 mg total) by mouth every eight (8) hours as needed for nausea. 30 tablet 2   ??? posaconazole (NOXAFIL) 100 mg TbEC delayed released tablet Take 3 tablets (300 mg total) by mouth daily. 90 tablet 1   ??? tacrolimus (PROGRAF) 0.5 MG capsule Take 3 capsules (1.5 mg total) by mouth two (2) times a day. 180 capsule 5   ??? TRINTELLIX 10 mg tablet Take 1 tablet (10 mg total) by mouth at bedtime. 30 tablet 5   ??? valGANciclovir (VALCYTE) 450 mg tablet Take 2 tablets (900 mg total) by mouth Two (2) times a day. 120 tablet 0   ??? zolpidem (AMBIEN) 10 mg tablet Take 1 tablet (10 mg total) by mouth nightly as needed for sleep. 20 tablet 0     No current facility-administered medications for this visit.     Facility-Administered Medications Ordered in Other Visits   Medication Dose Route Frequency Provider Last Rate Last Admin   ??? heparin, porcine (PF) 100 unit/mL injection 200 Units  200 Units Intravenous Q30 Min PRN Pernell Dupre, MD   200 Units at 03/10/20 0731       Physical Exam:  BP 117/70  - Pulse 83  - Temp 36.4 ??C (97.5 ??F) (Oral)  - Resp 18  - Ht 165.1 cm (5' 5)  - Wt 55.8 kg (123 lb 1.6 oz)  - LMP  (LMP Unknown)  - SpO2 100%  - BMI 20.48 kg/m??      General: No acute distress noted.   Central venous access: Line clean, dry, intact. No erythema or drainage noted.   ENT: Moist mucous membranes. Oropharhynx without lesions, erythema. Coating on tongue.   Cardiovascular: Pulse normal rate, regularity and rhythm. S1 and S2 normal, without any murmur, rub, or gallop.  Lungs: Clear to auscultation bilaterally, without wheezes/crackles/rhonchi. Good air movement.   Skin: Warm, dry, intact. No rash noted.   Psychiatry: Alert and oriented to person, place, and time.   Gastrointestinal/Abdomen: Normoactive bowel sounds, abdomen soft, non-tender   Musculoskeletal/Extremities: FROM throughout.   Neurologic: CNII-XII intact. Normal strength and sensation throughout    Karnofsky/Lansky Performance Status  80, Normal activity with effort; some signs or symptoms of disease (ECOG equivalent 1)    Test Results:   Reviewed in EPIC. Abnormal values discussed below.     Assessment/Plan:  Susan Kane is 59 yo woman  with CLL as well as AA/bone marrow failure.    ??  CLL:??The CT of C/A/P on 10/10/19 revealed no evidence of LAD or organomegaly. There was possible endometrial fluid or thickening in the fundus of the uterus.  The BMBx on 10/07/19 showed:   Scant bone marrow sampling (<5% cellular) essentially devoid of hematopoietic elements.  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  - ??Routine cytogenetic analysis is pending.   - Corresponding CBC: WBC 1.0, ANC 0.1.??  ??  12/26/19: BMBx:   - ??Hypocellular bone marrow (20% overall) involved by chronic lymphocytic leukemia/small lymphocytic lymphoma, representing approximately 20% of marrow cells by PAX5 immunohistochemistry  - Normal karyotype: 46,XX[20]  ??  BMT:??SD (stable disease)  HCT-CI (age adjusted) 5??(active CLL, psych, age adjusted)    Conditioning:  1. Rituximab 375 mg/m2 on day -13 and 1000 mg/m2 on D-6, +1, and +8  2. Bendamustine 130 mg/m2 IV daily over 10 minutes on D-5 through -3  3. Fludarabine 30 mg/m2 IV daily over 30 minutes on D-5 through -3  ??  Donor: 10/10, ABO ), CMV negative    Engraftment: Granix starting D+12 through neutrophil recovery (as defined as ANC 1.0 x 2 days or 3.0 x 1 day)  - Viral studies showed weakly positive EBV, CMV, HHV-6, plan to monitor for now  - Day +30 BMBx: markedly hypocellular marrow (<5%) with essentially absent hematopoiesis.   - Chimerisms:       PB UF PB CD3 BM UF BM CD3   02/09/20 63% donor Insufficient CD3 cells to quantify      02/10/20   83% Donor Insufficient CD3 cells to quantify   ** Plan to repeat chimerism Friday to coincide with day +60.    GvHD prophylaxis:   1. Tacrolimus goal 5-10, dose adjustments per BMT pharmacy.   ??** Current dose 1.5mg  BID  2. Completed Methotrexate 5 mg/m2 on D+1, +3, +6 and +11  3. rATG 1 mg/kg on D-2 and -1 included  ??  Heme:  Transfusion criteria: Transfuse 1 unit of PRBCs for Hgb<7 and 1 unit of platelets for Plt <10K or bleeding. No history of transfusion reactions.   - Granix given 8/9, 8/11  - Udenyca 6mg  x1 given 8/13 today WBC is 2.9  - 03/08/20: Will give 1 unit of platelets.   - 8/18: Plts today.      ID:????  Prophylaxis:  - Antiviral: Valcyte as below for CMV viremia.   - Antifungal: Continue Posaconazole 300 mg daily  - Antibacterial:??ANC 0.4 today, but giving Udenyca. If ANC remains <0.5 on next lab check, will start Cefdinir.    - PJP:  S/p pentamidine on 02/21/20, can replace with Bactrim once platelets engraft. For now will continue with monthly Pentamidine. ??    CMV:  - 02/24/20: CMV 207  - 8/10 CMV 884. Valganciclovir started.    - On 8/16 CMV decreased after 1 week of therapy to 140.  Next week if <50 can go to maintenece dosing of 450 BID.   - Letermovir denied. Appeal in progress.   ??  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  EBV: negative,  - Monitor weekly, ordered for next visit.     Neutropenic fever  03/02/20 Fever at home 100.4 and low grade in ER of 99.5. ??  - Cx NGTD  - IV  cefepime (8/8-8/10)  - Now afebrile.   ??  GI:??  GERD Prophylaxis: Pepcid.  This is now PRN.   ??  CINV:  - Anti-emetics per BMT protocol.   - Zofran works well for her  - Nightly zyprexa. Can consider cutting dose in half at the end of the week if doing well.     Coated Tongue: Thrush?  - Start Nystatin solution PRN  ??  Renal:   -??Cr??0.8 today,??baseline of ~ 0.7.    ??  FEN:   - Continue folic acid.    Hypomagnesemia:  - Mg 1.5,  Mag Chelate to 1 tablet TID. Gave 2 gm mag in infusion today.   ??  Endocrine:  Hypothyroid:??  - Continue Synthroid daily.   - Check TSH, T3, T4  ??  Hepatic:??  - VOD prophylaxis not warranted as non-myeloablative regimen.   ??  Hepatocellular liver injury pattern:  - Mild increase in AST, ALT, and AlkPhos, likely secondary to posaconazole. Stable today.     Endo:  - Blood sugar elevated today, 208. She had cereal and hot chocolate before her visit. If BS remains elevated at next visit will need to start monitoring and possibly start Metformin.    ??  CV:  HTN: currently not on treatment.  ??  Pulm:  AHRF: First noted on 7/22. Intermittent O2 requirement with exam findings concerning for LLL crackles. No signs of volume overload on exam. Chest-X ray clear. Now on RA.     Neuro/Pain:??  - No c/o at this time    Psych:??Has a home psychiatrist.   Depression/Anxiety:   - Per outpt note pt was taking Xanax 0.25 mg BID PRN for anxiety but this was not on her home med list thus I did not re-order at this time. Discussed with patient who stated she rarely takes.  - Continue Lithium 300mg  nightly  - Trintellix 10mg  nightly  - Psych consult PRN.   ??  Insomnia:  - Ambien 10mg  nightly PRN  ??  Caregiver/Lodging:  - Husband [name] will be primary caregiver  - Staying at local rental.     Dimple Nanas, ANP   [facility]      I personally spent 41 minutes face-to-face and non-face-to-face in the care of this patient, which includes all pre, intra, and post visit time on the date of service.

## 2020-03-12 ENCOUNTER — Encounter: Admit: 2020-03-12 | Discharge: 2020-03-12 | Payer: PRIVATE HEALTH INSURANCE

## 2020-03-12 ENCOUNTER — Ambulatory Visit: Admit: 2020-03-12 | Discharge: 2020-03-12 | Payer: PRIVATE HEALTH INSURANCE

## 2020-03-12 DIAGNOSIS — Z9484 Stem cells transplant status: Secondary | ICD-10-CM | POA: Diagnosis not present

## 2020-03-12 DIAGNOSIS — E039 Hypothyroidism, unspecified: Secondary | ICD-10-CM | POA: Diagnosis not present

## 2020-03-12 DIAGNOSIS — Z9221 Personal history of antineoplastic chemotherapy: Secondary | ICD-10-CM | POA: Diagnosis not present

## 2020-03-12 DIAGNOSIS — Z79899 Other long term (current) drug therapy: Secondary | ICD-10-CM | POA: Diagnosis not present

## 2020-03-12 DIAGNOSIS — Z7682 Awaiting organ transplant status: Secondary | ICD-10-CM | POA: Diagnosis not present

## 2020-03-12 DIAGNOSIS — I10 Essential (primary) hypertension: Secondary | ICD-10-CM | POA: Diagnosis not present

## 2020-03-12 DIAGNOSIS — B259 Cytomegaloviral disease, unspecified: Secondary | ICD-10-CM | POA: Diagnosis not present

## 2020-03-12 DIAGNOSIS — D61818 Other pancytopenia: Secondary | ICD-10-CM | POA: Diagnosis not present

## 2020-03-12 DIAGNOSIS — C9110 Chronic lymphocytic leukemia of B-cell type not having achieved remission: Secondary | ICD-10-CM | POA: Diagnosis not present

## 2020-03-12 DIAGNOSIS — D801 Nonfamilial hypogammaglobulinemia: Secondary | ICD-10-CM | POA: Diagnosis not present

## 2020-03-12 LAB — COMPREHENSIVE METABOLIC PANEL
ALBUMIN: 3.8 g/dL (ref 3.4–5.0)
ALKALINE PHOSPHATASE: 165 U/L — ABNORMAL HIGH (ref 46–116)
ALT (SGPT): 30 U/L (ref 10–49)
ANION GAP: 5 mmol/L (ref 5–14)
AST (SGOT): 15 U/L (ref <=34)
BILIRUBIN TOTAL: 0.5 mg/dL (ref 0.3–1.2)
BLOOD UREA NITROGEN: 18 mg/dL (ref 9–23)
BUN / CREAT RATIO: 23
CHLORIDE: 108 mmol/L — ABNORMAL HIGH (ref 98–107)
CO2: 26 mmol/L (ref 20.0–31.0)
EGFR CKD-EPI AA FEMALE: >90 mL/min/{1.73_m2} (ref >=60)
EGFR CKD-EPI NON-AA FEMALE: 80 mL/min/{1.73_m2} (ref >=60)
GLUCOSE RANDOM: 149 mg/dL (ref 70–179)
POTASSIUM: 4.1 mmol/L (ref 3.4–4.5)
PROTEIN TOTAL: 6.2 g/dL (ref 5.7–8.2)
SODIUM: 139 mmol/L (ref 135–145)

## 2020-03-12 LAB — SMEAR REVIEW

## 2020-03-12 LAB — CBC W/ AUTO DIFF
BASOPHILS ABSOLUTE COUNT: 0 10*9/L (ref 0.0–0.1)
EOSINOPHILS ABSOLUTE COUNT: 0 10*9/L (ref 0.0–0.4)
EOSINOPHILS RELATIVE PERCENT: 0.1 %
HEMOGLOBIN: 7.3 g/dL — ABNORMAL LOW (ref 12.0–16.0)
LARGE UNSTAINED CELLS: 8 % — ABNORMAL HIGH (ref 0–4)
LYMPHOCYTES ABSOLUTE COUNT: 0.5 10*9/L — ABNORMAL LOW (ref 1.5–5.0)
LYMPHOCYTES RELATIVE PERCENT: 24.5 %
MEAN CORPUSCULAR HEMOGLOBIN CONC: 35.1 g/dL (ref 31.0–37.0)
MEAN CORPUSCULAR HEMOGLOBIN: 28.6 pg (ref 26.0–34.0)
MEAN CORPUSCULAR VOLUME: 81.7 fL (ref 80.0–100.0)
MEAN PLATELET VOLUME: 9.2 fL (ref 7.0–10.0)
MONOCYTES ABSOLUTE COUNT: 0.1 10*9/L — ABNORMAL LOW (ref 0.2–0.8)
MONOCYTES RELATIVE PERCENT: 5.7 %
NEUTROPHILS ABSOLUTE COUNT: 1.3 10*9/L — ABNORMAL LOW (ref 2.0–7.5)
NEUTROPHILS RELATIVE PERCENT: 61.5 %
PLATELET COUNT: 15 10*9/L — ABNORMAL LOW (ref 150–440)
RED BLOOD CELL COUNT: 2.53 10*12/L — ABNORMAL LOW (ref 4.00–5.20)
WBC ADJUSTED: 2 10*9/L — ABNORMAL LOW (ref 4.5–11.0)

## 2020-03-12 LAB — MAGNESIUM: Magnesium:MCnc:Pt:Ser/Plas:Qn:: 1.7

## 2020-03-12 LAB — MEAN CORPUSCULAR HEMOGLOBIN: Erythrocyte mean corpuscular hemoglobin:EntMass:Pt:RBC:Qn:Automated count: 28.6

## 2020-03-12 LAB — ANION GAP: Anion gap 3:SCnc:Pt:Ser/Plas:Qn:: 5

## 2020-03-12 LAB — LACTATE DEHYDROGENASE: Lactate dehydrogenase:CCnc:Pt:Ser/Plas:Qn:Reaction: pyruvate to lactate: 209

## 2020-03-12 MED ORDER — MG-PLUS-PROTEIN 133 MG TABLET
ORAL_TABLET | Freq: Three times a day (TID) | ORAL | 2 refills | 34.00000 days | Status: CP
Start: 2020-03-12 — End: ?
  Filled 2020-03-17: qty 100, 34d supply, fill #0

## 2020-03-12 MED ADMIN — heparin, porcine (PF) 100 unit/mL injection 200 Units: 200 [IU] | INTRAVENOUS | @ 17:00:00 | Stop: 2020-03-12

## 2020-03-12 NOTE — Unmapped (Signed)
I talked to Susan Kane's daughter for this clinical as Susan Kane was with the MD.  I refilled Tacrolimus and Promacta and will call back next week for more through clinical when patient is at home.        [facility] Clinical Assessment & Refill Coordination Note    Susan Kane, Susan Kane: 1961-01-02  Phone: [phone] (home)     All above HIPAA information was verified with patient's family member, daughter.     Was a Nurse, learning disability used for this call? No    Specialty Medication(s):   Hematology/Oncology: Promacta and tacrolimus 0.5mg      Current Outpatient Medications   Medication Sig Dispense Refill   ??? calcium carbonate-vitamin D3 600 mg(1,500mg ) -800 unit Tab Take 1 tablet by mouth Two (2) times a day. (Patient not taking: Reported on 03/05/2020) 60 tablet 5   ??? eltrombopag (PROMACTA) 50 MG tablet Take 2 tablets (100 mg total) by mouth daily. Administer on an empty stomach, 1 hour before or 2 hours after a meal. 60 tablet 2   ??? famotidine (PEPCID) 20 MG tablet Take 1 tablet (20 mg total) by mouth two (2) times a day as needed for heartburn. 60 tablet 0   ??? folic acid (FOLVITE) 1 MG tablet Take 1 tablet (1 mg total) by mouth at bedtime. 30 tablet 5   ??? levothyroxine (SYNTHROID) 100 MCG tablet Take 1 tablet (100 mcg total) by mouth daily. 30 tablet 5   ??? lithium (LITHOBID) 300 MG ER tablet Take 1 tablet (300 mg total) by mouth at bedtime. 30 tablet 5   ??? magnesium oxide-Mg AA chelate (MAGNESIUM, AMINO ACID CHELATE,) 133 mg Tab Take 1 tablet by mouth Three (3) times a day. 100 tablet 2   ??? nystatin (MYCOSTATIN) 100,000 unit/mL suspension Take 5 mL (500,000 Units total) by mouth Four (4) times a day. 60 mL 0   ??? OLANZapine zydis (ZYPREXA) 5 MG disintegrating tablet Take 1 tablet (5 mg total) by mouth nightly. 30 tablet 2   ??? ondansetron (ZOFRAN-ODT) 8 MG disintegrating tablet Dissolve 1 tablet (8 mg total) in mouth every eight (8) hours as needed for nausea. 60 tablet 2   ??? polyethylene glycol (MIRALAX) 17 gram packet Take 17 g by mouth daily as needed (constipation).     ??? posaconazole (NOXAFIL) 100 mg TbEC delayed released tablet Take 3 tablets (300 mg total) by mouth daily. 90 tablet 1   ??? senna-docusate (SENNOSIDES-DOCUSATE SODIUM) 8.6-50 mg Take 1 tablet by mouth at bedtime.     ??? tacrolimus (PROGRAF) 0.5 MG capsule Take 3 capsules (1.5 mg total) by mouth two (2) times a day. 180 capsule 5   ??? TRINTELLIX 10 mg tablet Take 1 tablet (10 mg total) by mouth at bedtime. 30 tablet 5   ??? valGANciclovir (VALCYTE) 450 mg tablet Take 2 tablets (900 mg total) by mouth Two (2) times a day. 120 tablet 0   ??? zolpidem (AMBIEN) 10 mg tablet Take 1 tablet (10 mg total) by mouth nightly as needed for sleep. 20 tablet 0     No current facility-administered medications for this visit.        Changes to medications: Susan Kane reports no changes at this time.    No Known Allergies    Changes to allergies: No    SPECIALTY MEDICATION ADHERENCE     Promacta 50 mg: 0 days of medicine on hand   Tacrolimus 0.5 mg: 10 days of medicine on hand  Medication Adherence    Patient reported X missed doses in the last month: 0  Specialty Medication: Tacrolimus 0.5 mg  Patient is on additional specialty medications: Yes  Additional Specialty Medications: Promacta 50 mg  Patient Reported Additional Medication X Missed Doses in the Last Month: 0  Informant: child/children  Confirmed plan for next specialty medication refill: delivery by pharmacy  Refills needed for supportive medications: not needed          Specialty medication(s) dose(s) confirmed: Patient reports changes to the regimen as follows: Promacta 50 mg was increased to 2 tabs daily     Are there any concerns with adherence? No    Adherence counseling provided? Not needed    CLINICAL MANAGEMENT AND INTERVENTION      Clinical Benefit Assessment:    Do you feel the medicine is effective or helping your condition? Patient declined to answer    Clinical Benefit counseling provided? at follow up appt today    Adverse Effects Assessment:    Are you experiencing any side effects? daughter did not think so    Are you experiencing difficulty administering your medicine? No    Quality of Life Assessment:    How many days over the past month did your condition  keep you from your normal activities? For example, brushing your teeth or getting up in the morning. 0    Have you discussed this with your provider? Not needed    Therapy Appropriateness:    Is therapy appropriate? Yes, therapy is appropriate and should be continued    DISEASE/MEDICATION-SPECIFIC INFORMATION      N/A    PATIENT SPECIFIC NEEDS     - Does the patient have any physical, cognitive, or cultural barriers? No    - Is the patient high risk? Yes, patient is taking oral chemotherapy. Appropriateness of therapy as been assessed    - Does the patient require a Care Management Plan? No     - Does the patient require physician intervention or other additional services (i.e. nutrition, smoking cessation, social work)? No      SHIPPING     Specialty Medication(s) to be Shipped:   Hematology/Oncology: Promacta and tacrolimus 0.5mg     Other medication(s) to be shipped: No additional medications requested for fill at this time     Changes to insurance: No    Delivery Scheduled: Yes, Expected medication delivery date: 03/15/20.     Medication will be delivered via Same Day Courier to the confirmed prescription address in [facility].    The patient will receive a drug information handout for each medication shipped and additional FDA Medication Guides as required.  Verified that patient has previously received a Conservation officer, historic buildings.    All of the patient's questions and concerns have been addressed.    Emaree Chiu Vangie Bicker   [facility] Specialty Pharmacist

## 2020-03-12 NOTE — Unmapped (Addendum)
We are going to stop magnesium pills a you requested and we will give IV magnesium when you come if you need it.  You do not need it today.  You do not meet criteria for transfusion today.  We will give you all the things you need on Sunday, you can expect red cells, plts and possibly mag.  The type and screen is obtained today so that will shorten the wait for Sunday.    All lab results last 24 hours:    Recent Results (from the past 24 hour(s))   Prepare Platelet Pheresis    Collection Time: 03/11/20  7:00 PM   Result Value Ref Range    Unit Blood Type O Neg     ISBT Number 9500     Unit # [id number]     Status Transfused     Product ID Platelets     PRODUCT CODE [id number]    Prepare Platelet Pheresis    Collection Time: 03/11/20  7:00 PM   Result Value Ref Range    Unit Blood Type O Pos     ISBT Number 5100     Unit # [id number]     Status Transfused     Product ID Platelets     PRODUCT CODE [id number]

## 2020-03-12 NOTE — Unmapped (Signed)
BMT Clinic Follow-up    Patient Name: Susan Kane  MRN: [id number]  Encounter date: 03/12/2020    Referring Physician: Dr. Jillyn Hidden Nida Boatman) Truett Kane  Primary Care Provider: Thora Lance, MD  BMT Attending MD: Dr. Oswald Hillock    Disease: CLL  Current disease status: SD (stable disease)  Type of Transplant: RIC MUD Allo  Graft Source: Cryopreserved PBSCs  Transplant Day: 29     Study Participant: BMT CTN 1704: Composite Health Assessment Risk Model for Older Adults: Applying Pre-transplant Comorbidity, Geriatric Assessment and Biomarkers to Predict Non-Relapse Mortality After Allogeneic Transplant (CHARM).  ??  Interval History:  Susan Kane??is a 59 y.o.??woman??with a diagnosis of CLL??and aplastic anemia who is now s/p RIC MUD AlloSCT. Her transplant course was complicated by delayed engraftment. ??She was initially discharged from the hospital on 02/23/2020. However was readmitted on 02/27/20 with a fever and need for workup and IV cefepime. Infectious workup was unrevealing, IV cefepime was stopped on 03/02/20, and she was once again discharged on 03/03/20.      Susan Kane presents today for routine follow up today. She is here today with her daughter.    No vomiting on yesterday.  Zofran is helping.  She had platelets yesterday PPC was 13 then 24K after 2nd bag.  Reports mouth is ok. Tongue still coated.  No rash.  Bowels have moved several times but have been small.  Will take more miralax today she does not feels he has cleared out totally.  PO intake is low overall. Wt is stable. In the past 23 hrs she has had: chicken fried rice 1 c and peanut butter cup at dinner, PBJ and chips for lunch , protein bar and portion of smoothie for breakfast. Still having low energy. Walked the dog yesterday.  Does laps in the house.     Oncology History Overview Note   CLL:    Summary as per Dr. Kalman Drape most recent note with minor edits/additions from my review of records    1.??CLL?????diagnosed in August 2010, flow cytometry consistent with CLL  ?? Enlarged left??inguinal lymph node January 2019,??small??neck/axillary nodes and palpable splenomegaly 09/11/2017  ?? CTs??on 09/17/2017-3 cm necrotic appearing lymph node in the left inguinal region, borderline enlarged pelvic/retroperitoneal, chest, and axillary nodes. ??Mild splenomegaly.  ?? Ultrasound-guided biopsy of the left inguinal lymph node 09/18/2017, slightly purulent fluid aspirated, core biopsy is consistent with an atypical lymphoid proliferation???extensive necrosis with surrounding epithelioid histiocytes, limited intact lymphoid tissue involved with CLL  ?? Incisional biopsy of a necrotic/purulent left inguinal lymph node on 10/01/2017???extensive necrosis with granulomatous inflammation, small amount of viable lymphoid tissue involved with CLL, AFB and fungal stains negative  ?? Peripheral blood FISH analysis 02/05/2018??? +deletion 13q14, no evidence of p53 (17p13) deletion, no evidence of 11q22??deletion  - normal karyotype (46,XX)  ?? Bone marrow biopsy 02/26/2018???hypercellular marrow with extensive involvement by CLL, lymphocytes represent??85% of all cells  ?? Ibrutinib initiated 04/03/2018  ?? Ibrutinib placed on hold 04/11/2018 due to onset of arthralgias  ?? Ibrutinib resumed 04/16/2018, discontinued 04/25/2018 secondary to severe arthralgias/arthritis  ?? Ibrutinib resumed at a dose of 140 mg daily 05/03/2018  ?? Ibrutinib dose adjusted to 140 mg alternating with 280 mg 06/25/2018  ?? 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 ??Augmentin prescribed.  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 DAT negative, bilirubin and LDH normal  June 2019, progressive symptomatic anemia 04/01/2018, red cell transfusions 04/01/2018,??04/30/2018,??05/28/2018, 06/17/2018, and 07/05/2018  7.????Hypogammaglobulinemia    Baseline BM bx reviewed at [facility]  Final Diagnosis   (Outside Case #:  [id number], dated 02/26/2018)  Bone marrow, aspiration and biopsy  -  Hypercellular bone marrow (80%) with extensive involvement by chronic lymphocytic leukemia (87% lymphocytes by manual aspirate differential)  (See Comment)  -  By outside report, cytogenetic results are normal     Karyotype: 35, XX and FISH with 13q del but no 11q or 17p del    Repeat BM bx: (done after being off ibrutinib x1 month)  80% involvement by CLL    08/13/18: Presents to [facility] to discuss plan of care     Chronic lymphocytic leukemia (CLL), B-cell (CMS-HCC)   07/12/2018 Initial Diagnosis    Chronic lymphocytic leukemia (CLL), B-cell (CMS-HCC)     08/26/2018 -  Chemotherapy    Acalabrutinib started (approximate date)     11/14/2018 -  Chemotherapy    Acalabrutinib discontinued due to progressive bone marrow failure     12/06/2018 -  Chemotherapy    Ritux x1 given due to CLL still making up majority of BM cellularity (though in the setting of near aplasia of rest of TLH)     03/19/2019 -  Chemotherapy    Equine ATG / CsA for aplastic anemia (suspect 2/t acalabrutinib)     Chronic lymphocytic leukemia of B-cell type not having achieved remission (CMS-HCC)   01/21/2019 Initial Diagnosis    Chronic lymphocytic leukemia of B-cell type not having achieved remission (CMS-HCC)     01/01/2020 -  Chemotherapy    BMT IP BENDAMUSTINE / FLUDARABINE / RITUXIMAB (OP RITUXIMAB Days -13 & -6) (MUD)  bendamustine 130 mg/m2 IV on days -5, -4, -3   fludarabine 30 mg/m2 IV on days -5, -4, -3   riTUXimab 375 mg/m2 IV on day -13 as a OP treatment day, then 1,000 mg/m2 on days -6 as a OP treatment day, +1, +8   tacrolimus, 0.045 mg/kg PO BID on days -3 and -2 then  0.03 mg/kg PO BID starting on day -1   methotrexate 5 mg/m2 IV on days +1, +3, +6, +11   tbo-filgrastim 300 mcg (<= 75 kg) / 480 mcg (> 75 kg) q24h starting on day +7        Patient Active Problem List   Diagnosis   ??? Chronic lymphocytic leukemia (CLL), B-cell (CMS-HCC)   ??? Hypogammaglobulinemia (CMS-HCC)   ??? Chronic lymphocytic leukemia of B-cell type not having achieved remission (CMS-HCC)   ??? Aplastic anemia (CMS-HCC)   ??? 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 Lower abdominal pain   ??? Drug-induced nausea and vomiting   ??? Stem cell transplant candidate   ??? Chemotherapy induced neutropenia (CMS-HCC)   ??? History of allogeneic stem cell transplant (CMS-HCC)   ??? Osteopenia     Review of Systems:  A full system review was performed and was negative except as noted in the above interval history.    Reviewed and updated past medical, surgical, social, and family history as appropriate.      No Known Allergies  Current Outpatient Medications   Medication Sig Dispense Refill   ??? calcium carbonate-vitamin D3 600 mg(1,500mg ) -800 unit Tab Take 1 tablet by mouth Two (2) times a day. (Patient not taking: Reported on 03/05/2020) 60 tablet 5   ??? eltrombopag (PROMACTA) 50 MG tablet Take 2 tablets (100 mg total) by mouth daily.  Administer on an empty stomach, 1 hour before or 2 hours after a meal. 60 tablet 2   ??? famotidine (PEPCID) 20 MG tablet Take 1 tablet (20 mg total) by mouth two (2) times a day as needed for heartburn. 60 tablet 0   ??? folic acid (FOLVITE) 1 MG tablet Take 1 tablet (1 mg total) by mouth at bedtime. 30 tablet 5   ??? letermovir (PREVYMIS) 480 mg tablet Take 1 tablet (480 mg total) by mouth daily. (Patient not taking: Reported on 03/05/2020) 28 tablet 3   ??? levothyroxine (SYNTHROID) 100 MCG tablet Take 1 tablet (100 mcg total) by mouth daily. 30 tablet 5   ??? lithium (LITHOBID) 300 MG ER tablet Take 1 tablet (300 mg total) by mouth at bedtime. 30 tablet 5   ??? magnesium oxide-Mg AA chelate (MAGNESIUM, AMINO ACID CHELATE,) 133 mg Tab Take 1 tablet by mouth Three (3) times a day. 100 tablet 2   ??? nystatin (MYCOSTATIN) 100,000 unit/mL suspension Take 5 mL (500,000 Units total) by mouth Four (4) times a day. 60 mL 0   ??? OLANZapine zydis (ZYPREXA) 5 MG disintegrating tablet Take 1 tablet (5 mg total) by mouth nightly. 30 tablet 2   ??? ondansetron (ZOFRAN-ODT) 8 MG disintegrating tablet Dissolve 1 tablet (8 mg total) in mouth every eight (8) hours as needed for nausea. 60 tablet 2   ??? polyethylene glycol (MIRALAX) 17 gram packet Take 17 g by mouth daily as needed (constipation).     ??? posaconazole (NOXAFIL) 100 mg TbEC delayed released tablet Take 3 tablets (300 mg total) by mouth daily. 90 tablet 1   ??? senna-docusate (SENNOSIDES-DOCUSATE SODIUM) 8.6-50 mg Take 1 tablet by mouth at bedtime.     ??? tacrolimus (PROGRAF) 0.5 MG capsule Take 3 capsules (1.5 mg total) by mouth two (2) times a day. 180 capsule 5   ??? TRINTELLIX 10 mg tablet Take 1 tablet (10 mg total) by mouth at bedtime. 30 tablet 5   ??? valGANciclovir (VALCYTE) 450 mg tablet Take 2 tablets (900 mg total) by mouth Two (2) times a day. 120 tablet 0   ??? zolpidem (AMBIEN) 10 mg tablet Take 1 tablet (10 mg total) by mouth nightly as needed for sleep. 20 tablet 0     No current facility-administered medications for this visit.       Physical Exam:  Wt Readings from Last 1 Encounters:   03/12/20 56.3 kg (124 lb 3.2 oz)     Temp Readings from Last 1 Encounters:   03/12/20 36.6 ??C (97.9 ??F) (Oral)     BP Readings from Last 1 Encounters:   03/12/20 104/64     Pulse Readings from Last 1 Encounters:   03/12/20 86     SpO2 Readings from Last 1 Encounters:   03/12/20 97%       General: No acute distress noted.   Central venous access: Line clean, dry, intact. No erythema or drainage noted.   ENT: Moist mucous membranes. Oropharhynx without lesions, erythema. Coating on tongue.   Cardiovascular: Pulse normal rate, regularity and rhythm. S1 and S2 normal, without any murmur, rub, or gallop.  Lungs: Clear to auscultation bilaterally, without wheezes/crackles/rhonchi. Good air movement.   Skin: Warm, dry, intact. No rash noted.   Psychiatry: Alert and oriented to person, place, and time.   Gastrointestinal/Abdomen: Normoactive bowel sounds, abdomen soft, non-tender   Musculoskeletal/Extremities: FROM throughout.   Neurologic: CNII-XII intact. Normal strength and sensation throughout    Karnofsky/Lansky Performance  Status  80, Normal activity with effort; some signs or symptoms of disease (ECOG equivalent 1)    Test Results:   Reviewed in EPIC. Abnormal values discussed below.     Assessment/Plan:  Mrs. Winton is 59 yo woman with CLL as well as AA/bone marrow failure.  ??  CLL:??The CT of C/A/P on 10/10/19 revealed no evidence of LAD or organomegaly. There was possible endometrial fluid or thickening in the fundus of the uterus.  The BMBx on 10/07/19 showed:   Scant bone marrow sampling (<5% cellular) essentially devoid of hematopoietic elements.  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  - ??Routine cytogenetic analysis is pending.   -  Corresponding CBC: WBC 1.0, ANC 0.1.??  ??  12/26/19: BMBx:   - ??Hypocellular bone marrow (20% overall) involved by chronic lymphocytic leukemia/small lymphocytic lymphoma, representing approximately 20% of marrow cells by PAX5 immunohistochemistry  - Normal karyotype: 46,XX[20]  ??  BMT:??SD (stable disease)  HCT-CI (age adjusted) 5??(active CLL, psych, age adjusted)    Conditioning:  1. Rituximab 375 mg/m2 on day -13 and 1000 mg/m2 on D-6, +1, and +8  2. Bendamustine 130 mg/m2 IV daily over 10 minutes on D-5 through -3  3. Fludarabine 30 mg/m2 IV daily over 30 minutes on D-5 through -3  ??  Donor: 10/10, ABO ), CMV negative    Engraftment: Granix starting D+12 through neutrophil recovery (as defined as ANC 1.0 x 2 days or 3.0 x 1 day)  - Viral studies showed weakly positive EBV, CMV, HHV-6, plan to monitor for now  - Day +30 BMBx: markedly hypocellular marrow (<5%) with essentially absent hematopoiesis.   - Chimerisms:       PB UF PB CD3 BM UF BM CD3   02/09/20 63% donor Insufficient CD3 cells to quantify      02/10/20   83% Donor Insufficient CD3 cells to quantify   ** Plan to repeat chimerism Friday to coincide with day +60.    GvHD prophylaxis:   1. Tacrolimus goal 5-10, dose adjustments per BMT pharmacy.   ??** Current dose 1.5mg  BID  2. Completed Methotrexate 5 mg/m2 on D+1, +3, +6 and +11  3. rATG 1 mg/kg on D-2 and -1 included  ??  Heme:  Transfusion criteria: Transfuse 1 unit of PRBCs for Hgb<7 and 1 unit of platelets for Plt <10K or bleeding. No history of transfusion reactions.   - Granix given 8/9, 8/11  - Udenyca 6mg  x1 given 8/13 today WBC is 2.9  - 03/08/20: Will give 1 unit of platelets.   - 8/20: 15k Plts today.  RTC Sunday for plts and reds. Type and screen obtained today.     ID:????  Prophylaxis:  - Antiviral: Valcyte as below for CMV viremia.   - Antifungal: Continue Posaconazole 300 mg daily  - Antibacterial:??ANC 0.4 today, but giving Udenyca. If ANC remains <0.5 on next lab check, will start Cefdinir.    - PJP:  S/p pentamidine on 02/21/20, can replace with Bactrim once platelets engraft. For now will continue with monthly Pentamidine next on 8/30.    CMV:  - 02/24/20: CMV 207  - 8/10 CMV 884. Valganciclovir started.    - On 8/16 CMV decreased after 1 week of therapy to 140.  Next week if <50 can go to maintenece dosing of 450 BID.   - Letermovir denied. Appeal in progress.   ??  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  EBV: negative,  - Monitor weekly, ordered for next visit.     Neutropenic fever  03/02/20 Fever at home 100.4 and low grade in ER of 99.5. ??  - Cx NGTD  - IV cefepime (8/8-8/10)  - Now afebrile.   ??  GI:??  GERD Prophylaxis: Pepcid.  This is now PRN.   ??  CINV:  - Anti-emetics per BMT protocol.   - Zofran works well for her  - Nightly zyprexa.     Coated Tongue: Thrush?  - Nystatin solution PRN  ??  Renal:   -??Cr 0.8 today,??baseline of ~ 0.7.    ??  FEN:   - Continue folic acid.    Hypomagnesemia:  - Mg 1.7,  Mag Chelate to 1 tablet TID. ??  Endocrine:  Hypothyroid:??  - Continue Synthroid daily.   - Check TSH, T3, T4  ??  Hepatic:??  - VOD prophylaxis not warranted as non-myeloablative regimen.   ??  Hepatocellular liver injury pattern:  - Mild increase in AST, ALT, and AlkPhos, likely secondary to posaconazole. Stable today.     Endo:  - Blood sugar now WNL  ??  CV:  HTN: currently not on treatment.  ??  Pulm:  AHRF: First noted on 7/22. Intermittent O2 requirement with exam findings concerning for LLL crackles. No signs of volume overload on exam. Chest-X ray clear. Now on RA.     Neuro/Pain:??  - No c/o at this time    Psych:??Has a home psychiatrist.   Depression/Anxiety:   - Per outpt note pt was taking Xanax 0.25 mg BID PRN for anxiety but this was not on her home med list thus I did not re-order at this time. Discussed with patient who stated she rarely takes.  - Continue Lithium 300mg  nightly  - Trintellix 10mg  nightly  - Psych consult PRN.   ??  Insomnia:  - Ambien 10mg  nightly PRN  ??  Caregiver/Lodging:  - Husband [name] will be primary caregiver  - Staying at local rental.     Dimple Nanas, ANP   [facility]      I personally spent 70 minutes face-to-face and non-face-to-face in the care of this patient, which includes all pre, intra, and post visit time on the date of service.

## 2020-03-13 LAB — TACROLIMUS, TROUGH: Lab: 6

## 2020-03-14 ENCOUNTER — Encounter: Admit: 2020-03-14 | Discharge: 2020-03-15 | Payer: PRIVATE HEALTH INSURANCE

## 2020-03-14 DIAGNOSIS — D701 Agranulocytosis secondary to cancer chemotherapy: Secondary | ICD-10-CM | POA: Diagnosis not present

## 2020-03-14 DIAGNOSIS — T451X5A Adverse effect of antineoplastic and immunosuppressive drugs, initial encounter: Secondary | ICD-10-CM | POA: Diagnosis not present

## 2020-03-14 DIAGNOSIS — C9110 Chronic lymphocytic leukemia of B-cell type not having achieved remission: Secondary | ICD-10-CM | POA: Diagnosis not present

## 2020-03-14 DIAGNOSIS — Z7682 Awaiting organ transplant status: Secondary | ICD-10-CM | POA: Diagnosis not present

## 2020-03-14 LAB — CBC W/ AUTO DIFF
BASOPHILS ABSOLUTE COUNT: 0 10*9/L (ref 0.0–0.1)
BASOPHILS RELATIVE PERCENT: 0.3 %
EOSINOPHILS ABSOLUTE COUNT: 0 10*9/L (ref 0.0–0.4)
EOSINOPHILS RELATIVE PERCENT: 0.4 %
HEMATOCRIT: 18.7 % — ABNORMAL LOW (ref 36.0–46.0)
HEMOGLOBIN: 6.6 g/dL — ABNORMAL LOW (ref 12.0–16.0)
LARGE UNSTAINED CELLS: 5 % — ABNORMAL HIGH (ref 0–4)
LYMPHOCYTES ABSOLUTE COUNT: 0.4 10*9/L — ABNORMAL LOW (ref 1.5–5.0)
LYMPHOCYTES RELATIVE PERCENT: 32.6 %
MEAN CORPUSCULAR HEMOGLOBIN CONC: 35.1 g/dL (ref 31.0–37.0)
MEAN CORPUSCULAR HEMOGLOBIN: 28.5 pg (ref 26.0–34.0)
MEAN CORPUSCULAR VOLUME: 81.1 fL (ref 80.0–100.0)
MEAN PLATELET VOLUME: 10.9 fL — ABNORMAL HIGH (ref 7.0–10.0)
MONOCYTES ABSOLUTE COUNT: 0.1 10*9/L — ABNORMAL LOW (ref 0.2–0.8)
MONOCYTES RELATIVE PERCENT: 5.3 %
NEUTROPHILS ABSOLUTE COUNT: 0.7 10*9/L — ABNORMAL LOW (ref 2.0–7.5)
NEUTROPHILS RELATIVE PERCENT: 56.6 %
PLATELET COUNT: 9 10*9/L — CL (ref 150–440)
RED BLOOD CELL COUNT: 2.3 10*12/L — ABNORMAL LOW (ref 4.00–5.20)
RED CELL DISTRIBUTION WIDTH: 14 % (ref 12.0–15.0)
WBC ADJUSTED: 1.2 10*9/L — ABNORMAL LOW (ref 4.5–11.0)

## 2020-03-14 LAB — COMPREHENSIVE METABOLIC PANEL
ALBUMIN: 3.6 g/dL (ref 3.4–5.0)
ALKALINE PHOSPHATASE: 153 U/L — ABNORMAL HIGH (ref 46–116)
ALT (SGPT): 30 U/L (ref 10–49)
ANION GAP: 9 mmol/L (ref 5–14)
AST (SGOT): 20 U/L (ref <=34)
BILIRUBIN TOTAL: 0.5 mg/dL (ref 0.3–1.2)
BLOOD UREA NITROGEN: 22 mg/dL (ref 9–23)
BUN / CREAT RATIO: 23
CALCIUM: 7.5 mg/dL — ABNORMAL LOW (ref 8.7–10.4)
CHLORIDE: 111 mmol/L — ABNORMAL HIGH (ref 98–107)
CO2: 21 mmol/L (ref 20.0–31.0)
CREATININE: 0.96 mg/dL — ABNORMAL HIGH
EGFR CKD-EPI AA FEMALE: 74 mL/min/{1.73_m2} (ref >=60)
EGFR CKD-EPI NON-AA FEMALE: 64 mL/min/{1.73_m2} (ref >=60)
GLUCOSE RANDOM: 152 mg/dL (ref 70–179)
POTASSIUM: 4.2 mmol/L (ref 3.4–4.5)
PROTEIN TOTAL: 5.9 g/dL (ref 5.7–8.2)
SODIUM: 141 mmol/L (ref 135–145)

## 2020-03-14 LAB — SLIDE REVIEW

## 2020-03-14 LAB — PLATELET COUNT: PLATELET COUNT: 22 10*9/L — ABNORMAL LOW (ref 150–440)

## 2020-03-14 LAB — MAGNESIUM: MAGNESIUM: 1.9 mg/dL (ref 1.6–2.6)

## 2020-03-14 MED ADMIN — heparin, porcine (PF) 100 unit/mL injection 200 Units: 200 [IU] | INTRAVENOUS | @ 18:00:00 | Stop: 2020-03-15

## 2020-03-14 NOTE — Unmapped (Signed)
[facility]  Immunosuppressive Therapy Note    Susan Kane is a 59 y.o. 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 Ms. Flewellen is currently day +59 (as of 8/21).    Current dose: tacrolimus 1.5 mg PO BID    Goal tacrolimus Level: 5-10 ng/mL    Resulted level: 6 ng/mL    Lab Results   Component Value Date/Time    TACROLIMUS 6.0 03/12/2020 01:10 PM    TACROLIMUS 7.6 03/10/2020 07:31 AM    TACROLIMUS 5.2 03/08/2020 01:49 PM    TACROLIMUS 6.0 03/05/2020 10:54 AM     Lab Results   Component Value Date/Time    CREATININE 0.80 03/12/2020 01:10 PM    CREATININE 0.88 (H) 03/10/2020 07:31 AM    CREATININE 0.81 (H) 03/08/2020 01:49 PM    CREATININE 0.87 (H) 03/07/2020 08:51 AM      Assessment/Recommendation: Tacrolimus level is therapeutic; given timing of draw, true trough is likely closer to the upper end of her target goal range. Her SCr is overall stable. Alk phos is mildly elevated and TBili/LFTs are WNL. Given the above, plan to continue current tacrolimus dose of 1.5 mg PO BID and will recheck at next clinic visit to further assess trend and need for adjustment.     We will continue to monitor levels.  Patient will be followed for changes in renal and hepatic function, toxicity, and efficacy.     Berneta Levins, CPP   [facility]

## 2020-03-14 NOTE — Unmapped (Signed)
0930: Patient present for lab check infusion, no acute concerns reported. [name] present, labs drawn and sent for analysis. Patient comfortable with family present and no requests at this time.     1015: Hgb 6.6, Plt 9; transfusions needed. Consent noted in chart; pre-meds not needed per patient.     1115: Platelet transfusion in progress, 5 minute vitals obtained; no sign of reaction noted. Rate increased from 120 mL/hr to 700 mL/hr per protocol. Will continue to monitor.    1155: Plt transfusion complete with no reaction noted. Unit 1 of pRBC transfusion in progress, 5 minute vitals obtained; no sign of reaction noted. Rate increased from 120 mL/hr to 450 mL/hr per protocol. Will continue to monitor.    1300: Unit 1 complete with no reaction noted. Unit 2 in progress, 5 minute vitals obtained; no sign of reaction noted. Rate increased from 120 mL/hr 450 mL/hr per protocol. Will continue to monitor.    1410: Transfusion complete, no reaction noted. Post-transfusion platelets 22, no additional platelets needed. Central line care performed; all lumens flushed and heparin locked  per protocol with new caps placed. Patient discharged home alert, oriented and in stable condition.

## 2020-03-15 ENCOUNTER — Encounter: Admit: 2020-03-15 | Discharge: 2020-03-15 | Payer: PRIVATE HEALTH INSURANCE

## 2020-03-15 DIAGNOSIS — M8580 Other specified disorders of bone density and structure, unspecified site: Secondary | ICD-10-CM | POA: Diagnosis not present

## 2020-03-15 DIAGNOSIS — E039 Hypothyroidism, unspecified: Secondary | ICD-10-CM | POA: Diagnosis not present

## 2020-03-15 DIAGNOSIS — C9110 Chronic lymphocytic leukemia of B-cell type not having achieved remission: Secondary | ICD-10-CM | POA: Diagnosis not present

## 2020-03-15 DIAGNOSIS — C9112 Chronic lymphocytic leukemia of B-cell type in relapse: Secondary | ICD-10-CM | POA: Diagnosis not present

## 2020-03-15 DIAGNOSIS — Z79890 Hormone replacement therapy (postmenopausal): Secondary | ICD-10-CM | POA: Diagnosis not present

## 2020-03-15 DIAGNOSIS — D619 Aplastic anemia, unspecified: Secondary | ICD-10-CM | POA: Diagnosis not present

## 2020-03-15 DIAGNOSIS — R590 Localized enlarged lymph nodes: Secondary | ICD-10-CM | POA: Diagnosis not present

## 2020-03-15 DIAGNOSIS — D801 Nonfamilial hypogammaglobulinemia: Secondary | ICD-10-CM | POA: Diagnosis not present

## 2020-03-15 DIAGNOSIS — B1910 Unspecified viral hepatitis B without hepatic coma: Secondary | ICD-10-CM | POA: Diagnosis not present

## 2020-03-15 DIAGNOSIS — Z9484 Stem cells transplant status: Secondary | ICD-10-CM | POA: Diagnosis not present

## 2020-03-15 DIAGNOSIS — Z9221 Personal history of antineoplastic chemotherapy: Secondary | ICD-10-CM | POA: Diagnosis not present

## 2020-03-15 DIAGNOSIS — R161 Splenomegaly, not elsewhere classified: Secondary | ICD-10-CM | POA: Diagnosis not present

## 2020-03-15 DIAGNOSIS — R112 Nausea with vomiting, unspecified: Secondary | ICD-10-CM | POA: Diagnosis not present

## 2020-03-15 DIAGNOSIS — I10 Essential (primary) hypertension: Secondary | ICD-10-CM | POA: Diagnosis not present

## 2020-03-15 DIAGNOSIS — D702 Other drug-induced agranulocytosis: Principal | ICD-10-CM

## 2020-03-15 DIAGNOSIS — Z7682 Awaiting organ transplant status: Principal | ICD-10-CM

## 2020-03-15 DIAGNOSIS — T50905A Adverse effect of unspecified drugs, medicaments and biological substances, initial encounter: Secondary | ICD-10-CM

## 2020-03-15 DIAGNOSIS — D849 Immunodeficiency, unspecified: Principal | ICD-10-CM

## 2020-03-15 DIAGNOSIS — F325 Major depressive disorder, single episode, in full remission: Principal | ICD-10-CM

## 2020-03-15 LAB — CBC W/ AUTO DIFF
BASOPHILS ABSOLUTE COUNT: 0 10*9/L (ref 0.0–0.1)
BASOPHILS RELATIVE PERCENT: 0.4 %
EOSINOPHILS ABSOLUTE COUNT: 0 10*9/L (ref 0.0–0.4)
EOSINOPHILS RELATIVE PERCENT: 0.2 %
HEMATOCRIT: 25.1 % — ABNORMAL LOW (ref 36.0–46.0)
HEMOGLOBIN: 8.6 g/dL — ABNORMAL LOW (ref 12.0–16.0)
LARGE UNSTAINED CELLS: 3 % (ref 0–4)
LYMPHOCYTES ABSOLUTE COUNT: 0.4 10*9/L — ABNORMAL LOW (ref 1.5–5.0)
LYMPHOCYTES RELATIVE PERCENT: 26.8 %
MEAN CORPUSCULAR HEMOGLOBIN CONC: 34.3 g/dL (ref 31.0–37.0)
MEAN CORPUSCULAR HEMOGLOBIN: 27 pg (ref 26.0–34.0)
MEAN CORPUSCULAR VOLUME: 78.6 fL — ABNORMAL LOW (ref 80.0–100.0)
MEAN PLATELET VOLUME: 8.8 fL (ref 7.0–10.0)
MONOCYTES ABSOLUTE COUNT: 0.1 10*9/L — ABNORMAL LOW (ref 0.2–0.8)
MONOCYTES RELATIVE PERCENT: 6 %
NEUTROPHILS ABSOLUTE COUNT: 0.9 10*9/L — ABNORMAL LOW (ref 2.0–7.5)
NEUTROPHILS RELATIVE PERCENT: 63.3 %
PLATELET COUNT: 22 10*9/L — ABNORMAL LOW (ref 150–440)
RED BLOOD CELL COUNT: 3.19 10*12/L — ABNORMAL LOW (ref 4.00–5.20)
RED CELL DISTRIBUTION WIDTH: 16.2 % — ABNORMAL HIGH (ref 12.0–15.0)
WBC ADJUSTED: 1.5 10*9/L — ABNORMAL LOW (ref 4.5–11.0)

## 2020-03-15 LAB — MAGNESIUM: MAGNESIUM: 2 mg/dL (ref 1.6–2.6)

## 2020-03-15 LAB — COMPREHENSIVE METABOLIC PANEL
ALBUMIN: 3.7 g/dL (ref 3.4–5.0)
ALKALINE PHOSPHATASE: 147 U/L — ABNORMAL HIGH (ref 46–116)
ALT (SGPT): 25 U/L (ref 10–49)
ANION GAP: 7 mmol/L (ref 5–14)
AST (SGOT): 20 U/L (ref <=34)
BILIRUBIN TOTAL: 0.6 mg/dL (ref 0.3–1.2)
BLOOD UREA NITROGEN: 19 mg/dL (ref 9–23)
BUN / CREAT RATIO: 23
CALCIUM: 8.1 mg/dL — ABNORMAL LOW (ref 8.7–10.4)
CHLORIDE: 112 mmol/L — ABNORMAL HIGH (ref 98–107)
CO2: 21 mmol/L (ref 20.0–31.0)
CREATININE: 0.81 mg/dL — ABNORMAL HIGH
EGFR CKD-EPI AA FEMALE: >90 mL/min/{1.73_m2} (ref >=60)
EGFR CKD-EPI NON-AA FEMALE: 79 mL/min/{1.73_m2} (ref >=60)
GLUCOSE RANDOM: 173 mg/dL (ref 70–179)
POTASSIUM: 4.7 mmol/L — ABNORMAL HIGH (ref 3.4–4.5)
PROTEIN TOTAL: 6.2 g/dL (ref 5.7–8.2)
SODIUM: 140 mmol/L (ref 135–145)

## 2020-03-15 LAB — LACTATE DEHYDROGENASE: LACTATE DEHYDROGENASE: 217 U/L (ref 120–246)

## 2020-03-15 LAB — SLIDE REVIEW

## 2020-03-15 LAB — TACROLIMUS LEVEL, TROUGH: TACROLIMUS, TROUGH: 5.7 ng/mL (ref 5.0–15.0)

## 2020-03-15 MED ORDER — ONDANSETRON HCL 8 MG TABLET
ORAL_TABLET | Freq: Two times a day (BID) | ORAL | 2 refills | 15 days | Status: CP | PRN
Start: 2020-03-15 — End: 2021-03-15
  Filled 2020-03-17: qty 30, 15d supply, fill #0

## 2020-03-15 MED ADMIN — heparin, porcine (PF) 100 unit/mL injection 200 Units: 200 [IU] | INTRAVENOUS | @ 14:00:00 | Stop: 2020-03-15

## 2020-03-15 MED FILL — TACROLIMUS 0.5 MG CAPSULE, IMMEDIATE-RELEASE: 30 days supply | Qty: 180 | Fill #1 | Status: AC

## 2020-03-15 MED FILL — TACROLIMUS 0.5 MG CAPSULE, IMMEDIATE-RELEASE: ORAL | 30 days supply | Qty: 180 | Fill #1

## 2020-03-15 MED FILL — PROMACTA 50 MG TABLET: 30 days supply | Qty: 60 | Fill #0 | Status: AC

## 2020-03-15 NOTE — Unmapped (Signed)
Declined

## 2020-03-15 NOTE — Unmapped (Signed)
Susan Kane Susan Kane 's entire order  shipment will be sent out  as a result of Courier returned package      I have reached out to the patient and communicated the delivery change. We will reschedule the medication for the delivery date that the patient agreed upon.  We have confirmed the delivery date as 03/16/20, via same day courier. They are going to pick this up at [facility] on 03/17/20.

## 2020-03-15 NOTE — Unmapped (Signed)
Bone Marrow Transplant and Cellular Therapy Program  Immunosuppressive Therapy Note    Susan Kane is a 59 y.o. 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 Susan Kane is currently day +61.    Current dose: tacrolimus 1.5 mg PO BID    Goal tacrolimus Level: 5-10 ng/mL    Resulted level: 5.7 ng/mL    Lab Results   Component Value Date/Time    TACROLIMUS 5.7 03/15/2020 09:38 AM    TACROLIMUS 6.0 03/12/2020 01:10 PM    TACROLIMUS 7.6 03/10/2020 07:31 AM    TACROLIMUS 5.2 03/08/2020 01:49 PM     Lab Results   Component Value Date/Time    CREATININE 0.81 (H) 03/15/2020 09:38 AM    CREATININE 0.96 (H) 03/14/2020 09:28 AM    CREATININE 0.80 03/12/2020 01:10 PM    CREATININE 0.88 (H) 03/10/2020 07:31 AM      Assessment/Recommendation: Tacrolimus level is therapeutic. Her SCr is overall stable. Alk phos is mildly elevated and TBili/LFTs are WNL. Given the above, plan to continue current tacrolimus dose of 1.5 mg PO BID and will recheck at next clinic visit to further assess trend and need for adjustment.     We will continue to monitor levels.  Patient will be followed for changes in renal and hepatic function, toxicity, and efficacy.     Berneta Levins, CPP  [facility]

## 2020-03-15 NOTE — Unmapped (Signed)
BMT-CT Clinic Follow-up    Patient Name: Susan Kane  MRN: [id number]  Encounter date: 03/15/2020    Referring Physician: Dr. Jillyn Hidden Nida Boatman) [name]  Primary Care Provider: Thora Lance, MD  BMT Attending MD: Dr. Oswald Hillock    Disease: CLL  Current disease status: SD (stable disease)  Type of Transplant: RIC MUD Allo  Graft Source: Cryopreserved PBSCs  Transplant Day: +61      Study Participant: BMT CTN 1704: Composite Health Assessment Risk Model for Older Adults: Applying Pre-transplant Comorbidity, Geriatric Assessment and Biomarkers to Predict Non-Relapse Mortality After Allogeneic Transplant (CHARM).  ??  HPI:  Brieann Osinski Bratton??is a 59 y.o.??woman??with a diagnosis of CLL??and aplastic anemia who is now s/p RIC MUD AlloSCT. Her transplant course was complicated by delayed engraftment. ??She was initially discharged from the hospital on 02/23/2020. However was re-admitted on 02/27/20 with a fever and need for workup and IV cefepime. Infectious workup was unrevealing, IV cefepime was stopped on 03/02/20, and she was once again discharged on 03/03/20.      Interval History: Overall feeling about the same. Very low energy which is worse when she needs a blood transfusion. Received 2 un Sunday and 1 un platelets. No visible bleeding - sometimes gums will bleed with no counts but not currently. Using a toothette or being very gentle with a soft toothbrush. Nausea is very well controlled with her current regimen of ondansetron 8mg , before AM and PM pills [although she would like to change from the odt to pills] and zyprexa at night. Experiences constipation - managing with as needed colace and miralax. Not having a great appetite but is eating several small meals a day and drinking fairly well. Coated tongue - uses nystatin S&S occasionally.     Her husband [name] is encouraging walking, which they do several times a week in the evenings and they have been driving around in the car. Anemia makes her get winded easily but no acute sob. Relieved with rest.      No fevers or infectious signs or symptoms. Last udenyca was 03/05/20. CMV has been trending down - on induction dose valcyte.     Oncology History Overview Note   CLL:    Summary as per Dr. Kalman Drape most recent note with minor edits/additions from my review of records    1.??CLL?????diagnosed in August 2010, flow cytometry consistent with CLL  ?? Enlarged left??inguinal lymph node January 2019,??small??neck/axillary nodes and palpable splenomegaly 09/11/2017  ?? CTs??on 09/17/2017-3 cm necrotic appearing lymph node in the left inguinal region, borderline enlarged pelvic/retroperitoneal, chest, and axillary nodes. ??Mild splenomegaly.  ?? Ultrasound-guided biopsy of the left inguinal lymph node 09/18/2017, slightly purulent fluid aspirated, core biopsy is consistent with an atypical lymphoid proliferation???extensive necrosis with surrounding epithelioid histiocytes, limited intact lymphoid tissue involved with CLL  ?? Incisional biopsy of a necrotic/purulent left inguinal lymph node on 10/01/2017???extensive necrosis with granulomatous inflammation, small amount of viable lymphoid tissue involved with CLL, AFB and fungal stains negative  ?? Peripheral blood FISH analysis 02/05/2018??? +deletion 13q14, no evidence of p53 (17p13) deletion, no evidence of 11q22??deletion  - normal karyotype (46,XX)  ?? Bone marrow biopsy 02/26/2018???hypercellular marrow with extensive involvement by CLL, lymphocytes represent??85% of all cells  ?? Ibrutinib initiated 04/03/2018  ?? Ibrutinib placed on hold 04/11/2018 due to onset of arthralgias  ?? Ibrutinib resumed 04/16/2018, discontinued 04/25/2018 secondary to severe arthralgias/arthritis  ?? Ibrutinib resumed at a dose of 140 mg daily 05/03/2018  ?? Ibrutinib dose adjusted to  140 mg alternating with 280 mg 06/25/2018  ?? 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 ??Augmentin prescribed.  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    Baseline BM bx reviewed at [facility]  Final Diagnosis   (Outside Case #:  [id number], dated 02/26/2018)  Bone marrow, aspiration and biopsy  -  Hypercellular bone marrow (80%) with extensive involvement by chronic lymphocytic leukemia (87% lymphocytes by manual aspirate differential)  (See Comment)  -  By outside report, cytogenetic results are normal     Karyotype: 25, XX and FISH with 13q del but no 11q or 17p del    Repeat BM bx: (done after being off ibrutinib x1 month)  80% involvement by CLL    08/13/18: Presents to [facility] to discuss plan of care     Chronic lymphocytic leukemia (CLL), B-cell (CMS-HCC)   07/12/2018 Initial Diagnosis    Chronic lymphocytic leukemia (CLL), B-cell (CMS-HCC)     08/26/2018 -  Chemotherapy    Acalabrutinib started (approximate date)     11/14/2018 -  Chemotherapy    Acalabrutinib discontinued due to progressive bone marrow failure     12/06/2018 -  Chemotherapy    Ritux x1 given due to CLL still making up majority of BM cellularity (though in the setting of near aplasia of rest of TLH)     03/19/2019 -  Chemotherapy    Equine ATG / CsA for aplastic anemia (suspect 2/t acalabrutinib)     Chronic lymphocytic leukemia of B-cell type not having achieved remission (CMS-HCC)   01/21/2019 Initial Diagnosis    Chronic lymphocytic leukemia of B-cell type not having achieved remission (CMS-HCC)     01/01/2020 -  Chemotherapy    BMT IP BENDAMUSTINE / FLUDARABINE / RITUXIMAB (OP RITUXIMAB Days -13 & -6) (MUD)  bendamustine 130 mg/m2 IV on days -5, -4, -3   fludarabine 30 mg/m2 IV on days -5, -4, -3   riTUXimab 375 mg/m2 IV on day -13 as a OP treatment day, then 1,000 mg/m2 on days -6 as a OP treatment day, +1, +8   tacrolimus, 0.045 mg/kg PO BID on days -3 and -2 then  0.03 mg/kg PO BID starting on day -1   methotrexate 5 mg/m2 IV on days +1, +3, +6, +11   tbo-filgrastim 300 mcg (<= 75 kg) / 480 mcg (> 75 kg) q24h starting on day +7        Patient Active Problem List   Diagnosis   ??? Chronic lymphocytic leukemia (CLL), B-cell (CMS-HCC)   ??? Hypogammaglobulinemia (CMS-HCC)   ??? Chronic lymphocytic leukemia of B-cell type not having achieved remission (CMS-HCC)   ??? Aplastic anemia (CMS-HCC)   ??? 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 Lower abdominal pain   ??? Drug-induced nausea and vomiting   ??? Stem cell transplant candidate   ??? Chemotherapy induced neutropenia (CMS-HCC)   ??? History of allogeneic stem cell transplant (CMS-HCC)   ??? Osteopenia     Review of Systems:  A full system review was performed and was negative except as noted in the above interval history.    Reviewed and updated past medical, surgical, social, and family history as appropriate.      No Known Allergies  Current Outpatient Medications   Medication Sig Dispense Refill   ??? calcium carbonate-vitamin D3 600 mg(1,500mg ) -800 unit Tab Take 1 tablet by mouth Two (2) times a day. (Patient not taking: Reported on 03/05/2020) 60 tablet 5   ??? eltrombopag (PROMACTA) 50 MG tablet Take 2 tablets (100 mg total) by mouth daily. Administer on an empty stomach, 1 hour before or 2 hours after a meal. 60 tablet 2   ??? famotidine (PEPCID) 20 MG tablet Take 1 tablet (20 mg total) by mouth two (2) times a day as needed for heartburn. 60 tablet 0   ??? folic acid (FOLVITE) 1 MG tablet Take 1 tablet (1 mg total) by mouth at bedtime. 30 tablet 5   ??? levothyroxine (SYNTHROID) 100 MCG tablet Take 1 tablet (100 mcg total) by mouth daily. 30 tablet 5   ??? lithium (LITHOBID) 300 MG ER tablet Take 1 tablet (300 mg total) by mouth at bedtime. 30 tablet 5   ??? magnesium oxide-Mg AA chelate (MAGNESIUM, AMINO ACID CHELATE,) 133 mg Tab Take 1 tablet by mouth Three (3) times a day. 100 tablet 2   ??? nystatin (MYCOSTATIN) 100,000 unit/mL suspension Take 5 mL (500,000 Units total) by mouth Four (4) times a day. 60 mL 0   ??? OLANZapine zydis (ZYPREXA) 5 MG disintegrating tablet Take 1 tablet (5 mg total) by mouth nightly. 30 tablet 2   ??? ondansetron (ZOFRAN-ODT) 8 MG disintegrating tablet Dissolve 1 tablet (8 mg total) in mouth every eight (8) hours as needed for nausea. 60 tablet 2   ??? polyethylene glycol (MIRALAX) 17 gram packet Take 17 g by mouth daily as needed (constipation).     ??? posaconazole (NOXAFIL) 100 mg TbEC delayed released tablet Take 3 tablets (300 mg total) by mouth daily. 90 tablet 1   ??? senna-docusate (SENNOSIDES-DOCUSATE SODIUM) 8.6-50 mg Take 1 tablet by mouth at bedtime.     ??? tacrolimus (PROGRAF) 0.5 MG capsule Take 3 capsules (1.5 mg total) by mouth two (2) times a day. 180 capsule 5   ??? TRINTELLIX 10 mg tablet Take 1 tablet (10 mg total) by mouth at bedtime. 30 tablet 5   ??? valGANciclovir (VALCYTE) 450 mg tablet Take 2 tablets (900 mg total) by mouth Two (2) times a day. 120 tablet 0   ??? zolpidem (AMBIEN) 10 mg tablet Take 1 tablet (10 mg total) by mouth nightly as needed for sleep. 20 tablet 0     No current facility-administered medications for this visit.     Objective:    There were no vitals filed for this visit.    Wt Readings from Last 1 Encounters:   03/14/20 55.5 kg (122 lb 4.8 oz)     Temp Readings from Last 1 Encounters:   03/14/20 36.5 ??C (97.7 ??F) (Oral)     BP Readings from Last 1 Encounters:   03/14/20 114/69     Pulse Readings from Last 1 Encounters:   03/14/20 74     SpO2 Readings from Last 1 Encounters:   03/12/20 97%     Physical Exam:  General appearance - appears well, no distress noted  Mental status - affect appropriate to mood  Eyes - conjunctivae clear, sclera anicteric   ENT - mucous membranes moist, no ulcerations noted or erythema, normal non-tender sinuses   Neck - supple, no palpable LAD  Chest - lungs are clear to auscultation bilaterally without wheezes/rales/rhonchi   Heart - regular rate and rhythm without murmur, rub, or gallop  Abdomen - soft, bowel sounds within normal limits, no masses, no tenderness, no hepatosplenomegaly   Neurological - motor and sensory grossly normal bilaterally  Musculoskeletal - no joint tenderness, deformity or swelling, no muscular tenderness noted  Extremities - no pedal edema noted  Skin - no skin breakdown noted, no rash   Venous access - line site is non-tender, no erythema/drainage    Karnofsky/Lansky Performance Status  80, Normal activity with effort; some signs or symptoms of disease (ECOG equivalent 1)    Test Results:   Reviewed in EPIC. Abnormal values discussed below.     Assessment/Plan:  Mrs. Titterington is 59 yo woman with CLL as well as AA/bone marrow failure.  ??  CLL: CT C/A/P on 10/10/19 revealed no evidence of LAD or organomegaly. Possible endometrial fluid or thickening in the fundus of the uterus.  10/07/19 BMBx: Scant bone marrow sampling (<5% cellular) essentially devoid of hematopoietic elements.  - ??Persistent CLL representing 23% of marrow cellularity by flow cytometric analysis.  -  Corresponding CBC: WBC 1.0, ANC 0.1.  ????????  ??  12/26/19: BmBx:   - ??Hypocellular (20% overall) involved by chronic lymphocytic leukemia/small lymphocytic lymphoma, representing approximately 20% of marrow cells by PAX5 immunohistochemistry.  - Normal karyotype: 46,XX[20]  ??  BMT:??SD (stable disease)  HCT-CI (age adjusted) 4??(active CLL, psych, age adjusted)    Conditioning:  1. Rituximab 375 mg/m2 on day -13 and 1000 mg/m2 on D-6, +1, and +8  2. Bendamustine 130 mg/m2 IV daily over 10 minutes on D-5 through -3  3. Fludarabine 30 mg/m2 IV daily over 30 minutes on D-5 through -3  ??  Donor: 10/10, ABO identical; O - , CMV negative  Cell dose: 5.51x10^6 cells/kg    Engraftment:   - Granix D+12 through WBC recovery (ANC 1.0 x 2 days or 3.0 x 1 day)  - Viral studies showed weakly positive EBV, CMV, HHV-6, plan to monitor for now  - D+30 BMBx: markedly hypocellular marrow (<5%) with essentially absent hematopoiesis.   - Chimerisms:       PB UF PB CD3 BM UF BM CD3   02/09/20 63% donor Insufficient CD3 cells to quantify      02/10/20   83% Donor Insufficient CD3 cells to quantify     - ??Routine, 30d post transplant DNA analysis is pending.     GvHD prophylaxis:   1. Tacrolimus goal 5-10, dose adjustments per BMT pharmacy.   ??** Current dose 1.5mg  BID  2. Completed Methotrexate 5 mg/m2 on D+1, +3, +6 and +11  3. rATG 1 mg/kg on D-2 and -1 included  ??  Heme:  Transfusion criteria: ABO identical donor [O-]   - Standard txf criteria: 1 unit of PRBCs for Hgb<7 and 1 unit of platelets for Plt <10K or bleeding.     ** No history of transfusion reactions.   - Granix for WBC of 1 or less [given 8/9, 03/03/20]   - Udenyca 6mg  x1 given 03/05/20  - Has been requiring PRBCs and platelets [last transfused 03/14/20].   03/15/20: Remains with cytopenias [CMV vs medication effect]. WBC/ANC improved slightly today. D+60 DNA studies sent today.     ID:????  Prophylaxis:  - Antiviral: Valcyte as below for CMV viremia.   - Antifungal: Posaconazole 300 mg daily  - Antibacterial:??If ANC remains <0.5 on next lab check, will start Cefdinir.    - PJP: S/p pentamidine on 02/21/20, can replace with Bactrim once platelets engraft. For now will continue with monthly Pentamidine  next on 03/22/20.    CMV:  02/24/20: CMV 207  03/02/20: CMV 884. Valganciclovir started.    03/08/20: CMV decreased after 1 week of therapy to 140.  03/15/20: Repeat pending, if <50 can go to maintenance dosing.  ** Letermovir denied. Appeal in progress.   ??  HHV-6:   - Peaked on PCR at 41.2, now negative.   ??  EBV: negative,  - Weekly, pending     Neutropenic fever  03/02/20 Fever at home 100.4 and low grade in ER of 99.5. ??  - Cx NGTD  - IV cefepime (8/8-8/10)  - Now afebrile.   ??  GI:??  GERD Prophylaxis: Pepcid.  This is now PRN.   ??  CINV:  - Anti-emetics per BMT protocol.   - Zofran 8mg  prn [taking bid]  - Nightly zyprexa.     Coated Tongue:   - Nystatin solution PRN.   ??  Renal:   - No active issues.   ??  FEN:   - Continue folic acid.    Hypomagnesemia:  - Mag Chelate 1 tablet TID.   ??  Endocrine:  Hypothyroid:??  - Continue Synthroid daily.   - TSH, T3, T4 - normal   ??  Hepatic:??  - VOD prophylaxis not warranted as non-myeloablative regimen.   ??  Hepatocellular liver injury pattern:  - Mild increase in AST, ALT, and AlkPhos, likely secondary to posaconazole. Stable today.   ??  CV:  HTN: Now normotensive off treatment.   ??  Pulm:  AHRF: First noted on 7/22. Intermittent O2 requirement with exam findings concerning for LLL crackles. No signs of volume overload on exam. Chest-X ray clear. Now on RA.     Neuro/Pain:??  - No c/o at this time    Psych:??Has a home psychiatrist.   Depression/Anxiety:   - Per outpt note pt was taking Xanax 0.25 mg BID PRN for anxiety but this was not on her home med list thus I did not re-order at this time. Discussed with patient who stated she rarely takes.  - Lithium 300mg  nightly   03/15/20: Checking with home prescriber to see if they want [place] to check a level; [last 12/2019]   - Trintellix 10mg  nightly  ??  Insomnia:  - Ambien 10mg  nightly PRN  ??  Caregiver/Lodging:  - Husband [name] will be primary caregiver  - Staying at [facility].     Cala Bradford A. Marisa Hua, FNP-BC  Nurse Practitioner - Adult BMT    I personally spent 60 minutes face-to-face and non-face-to-face in the care of this patient, which includes all pre, intra, and post visit time on the date of service.

## 2020-03-16 LAB — CMV DNA, QUANTITATIVE, PCR
CMV QUANT: <50 [IU]/mL — ABNORMAL HIGH (ref <0)
CMV VIRAL LD: DETECTED — AB

## 2020-03-17 ENCOUNTER — Other Ambulatory Visit: Admit: 2020-03-17 | Discharge: 2020-03-17 | Payer: PRIVATE HEALTH INSURANCE

## 2020-03-17 ENCOUNTER — Ambulatory Visit
Admit: 2020-03-17 | Discharge: 2020-03-17 | Payer: PRIVATE HEALTH INSURANCE | Attending: Nurse Practitioner | Primary: Nurse Practitioner

## 2020-03-17 DIAGNOSIS — D619 Aplastic anemia, unspecified: Secondary | ICD-10-CM | POA: Diagnosis not present

## 2020-03-17 DIAGNOSIS — E039 Hypothyroidism, unspecified: Secondary | ICD-10-CM | POA: Diagnosis not present

## 2020-03-17 DIAGNOSIS — Z48298 Encounter for aftercare following other organ transplant: Secondary | ICD-10-CM | POA: Diagnosis not present

## 2020-03-17 DIAGNOSIS — C9110 Chronic lymphocytic leukemia of B-cell type not having achieved remission: Secondary | ICD-10-CM | POA: Diagnosis not present

## 2020-03-17 DIAGNOSIS — D849 Immunodeficiency, unspecified: Secondary | ICD-10-CM | POA: Diagnosis not present

## 2020-03-17 DIAGNOSIS — C9112 Chronic lymphocytic leukemia of B-cell type in relapse: Secondary | ICD-10-CM | POA: Diagnosis not present

## 2020-03-17 DIAGNOSIS — Z79899 Other long term (current) drug therapy: Secondary | ICD-10-CM | POA: Diagnosis not present

## 2020-03-17 DIAGNOSIS — Z9484 Stem cells transplant status: Secondary | ICD-10-CM | POA: Diagnosis not present

## 2020-03-17 DIAGNOSIS — Z9221 Personal history of antineoplastic chemotherapy: Secondary | ICD-10-CM | POA: Diagnosis not present

## 2020-03-17 DIAGNOSIS — D709 Neutropenia, unspecified: Secondary | ICD-10-CM | POA: Diagnosis not present

## 2020-03-17 DIAGNOSIS — B259 Cytomegaloviral disease, unspecified: Principal | ICD-10-CM

## 2020-03-17 LAB — CBC W/ AUTO DIFF
BASOPHILS ABSOLUTE COUNT: 0 10*9/L (ref 0.0–0.1)
BASOPHILS RELATIVE PERCENT: 0.1 %
EOSINOPHILS ABSOLUTE COUNT: 0 10*9/L (ref 0.0–0.4)
EOSINOPHILS RELATIVE PERCENT: 0.4 %
HEMATOCRIT: 22.8 % — ABNORMAL LOW (ref 36.0–46.0)
HEMOGLOBIN: 8.2 g/dL — ABNORMAL LOW (ref 12.0–16.0)
LARGE UNSTAINED CELLS: 6 % — ABNORMAL HIGH (ref 0–4)
LYMPHOCYTES ABSOLUTE COUNT: 0.3 10*9/L — ABNORMAL LOW (ref 1.5–5.0)
LYMPHOCYTES RELATIVE PERCENT: 28.6 %
MEAN CORPUSCULAR HEMOGLOBIN CONC: 36 g/dL (ref 31.0–37.0)
MEAN CORPUSCULAR HEMOGLOBIN: 27.9 pg (ref 26.0–34.0)
MEAN CORPUSCULAR VOLUME: 77.5 fL — ABNORMAL LOW (ref 80.0–100.0)
MEAN PLATELET VOLUME: 7.9 fL (ref 7.0–10.0)
MONOCYTES ABSOLUTE COUNT: 0.1 10*9/L — ABNORMAL LOW (ref 0.2–0.8)
MONOCYTES RELATIVE PERCENT: 4.1 %
NEUTROPHILS ABSOLUTE COUNT: 0.7 10*9/L — ABNORMAL LOW (ref 2.0–7.5)
NEUTROPHILS RELATIVE PERCENT: 60.8 %
PLATELET COUNT: 12 10*9/L — ABNORMAL LOW (ref 150–440)
RED BLOOD CELL COUNT: 2.95 10*12/L — ABNORMAL LOW (ref 4.00–5.20)
RED CELL DISTRIBUTION WIDTH: 16 % — ABNORMAL HIGH (ref 12.0–15.0)
WBC ADJUSTED: 1.2 10*9/L — ABNORMAL LOW (ref 4.5–11.0)

## 2020-03-17 LAB — COMPREHENSIVE METABOLIC PANEL
ALBUMIN: 3.7 g/dL (ref 3.4–5.0)
ALKALINE PHOSPHATASE: 149 U/L — ABNORMAL HIGH (ref 46–116)
ALT (SGPT): 35 U/L (ref 10–49)
ANION GAP: 7 mmol/L (ref 5–14)
AST (SGOT): 25 U/L (ref <=34)
BILIRUBIN TOTAL: 0.5 mg/dL (ref 0.3–1.2)
BLOOD UREA NITROGEN: 19 mg/dL (ref 9–23)
BUN / CREAT RATIO: 21
CALCIUM: 9.3 mg/dL (ref 8.7–10.4)
CHLORIDE: 110 mmol/L — ABNORMAL HIGH (ref 98–107)
CO2: 22 mmol/L (ref 20.0–31.0)
CREATININE: 0.89 mg/dL — ABNORMAL HIGH
EGFR CKD-EPI AA FEMALE: 81 mL/min/{1.73_m2} (ref >=60)
EGFR CKD-EPI NON-AA FEMALE: 71 mL/min/{1.73_m2} (ref >=60)
GLUCOSE RANDOM: 148 mg/dL (ref 70–179)
POTASSIUM: 5.2 mmol/L — ABNORMAL HIGH (ref 3.4–4.5)
PROTEIN TOTAL: 6.1 g/dL (ref 5.7–8.2)
SODIUM: 139 mmol/L (ref 135–145)

## 2020-03-17 LAB — MAGNESIUM: MAGNESIUM: 2.1 mg/dL (ref 1.6–2.6)

## 2020-03-17 LAB — SLIDE REVIEW

## 2020-03-17 LAB — TACROLIMUS LEVEL, TROUGH: TACROLIMUS, TROUGH: 4.9 ng/mL — ABNORMAL LOW (ref 5.0–15.0)

## 2020-03-17 MED ORDER — ZOLPIDEM 5 MG TABLET
ORAL_TABLET | ORAL | 0 refills | 0.00000 days | Status: CP
Start: 2020-03-17 — End: 2020-03-17
  Filled 2020-03-21: qty 60, 30d supply, fill #0

## 2020-03-17 MED ORDER — VALGANCICLOVIR 450 MG TABLET
ORAL_TABLET | Freq: Two times a day (BID) | ORAL | 0 refills | 60 days | Status: CP
Start: 2020-03-17 — End: ?

## 2020-03-17 MED ORDER — ZOLPIDEM 5 MG TABLET: tablet | 0 refills | 0 days | Status: AC

## 2020-03-17 MED ADMIN — heparin, porcine (PF) 100 unit/mL injection 200 Units: 200 [IU] | INTRAVENOUS | @ 16:00:00 | Stop: 2020-03-17

## 2020-03-17 MED FILL — FAMOTIDINE 20 MG TABLET: 30 days supply | Qty: 60 | Fill #0 | Status: AC

## 2020-03-17 MED FILL — LEVOTHYROXINE 100 MCG TABLET: 30 days supply | Qty: 30 | Fill #0 | Status: AC

## 2020-03-17 MED FILL — MG-PLUS-PROTEIN 133 MG TABLET: 34 days supply | Qty: 100 | Fill #0 | Status: AC

## 2020-03-17 MED FILL — FOLIC ACID 1 MG TABLET: 30 days supply | Qty: 30 | Fill #1 | Status: AC

## 2020-03-17 MED FILL — FOLIC ACID 1 MG TABLET: ORAL | 30 days supply | Qty: 30 | Fill #1

## 2020-03-17 MED FILL — ONDANSETRON HCL 8 MG TABLET: 15 days supply | Qty: 30 | Fill #0 | Status: AC

## 2020-03-17 NOTE — Unmapped (Unsigned)
BMT-CT Clinic Follow-up    Patient Name: Susan Kane  MRN: [id number]  Encounter date: 03/17/2020    Referring Physician: Dr. Jillyn Hidden Nida Boatman) [name]  Primary Care Provider: Thora Lance, MD  BMT Attending MD: Dr. Oswald Hillock    Disease: CLL  Current disease status: SD (stable disease)  Type of Transplant: RIC MUD Allo  Graft Source: Cryopreserved PBSCs  Transplant Day: +63     Study Participant: BMT CTN 1704: Composite Health Assessment Risk Model for Older Adults: Applying Pre-transplant Comorbidity, Geriatric Assessment and Biomarkers to Predict Non-Relapse Mortality After Allogeneic Transplant (CHARM).  ??  HPI:  Susan Kane??is a 59 y.o.??woman??with a diagnosis of CLL??and aplastic anemia who is now s/p RIC MUD AlloSCT. Her transplant course was complicated by delayed engraftment. ??She was initially discharged from the hospital on 02/23/2020. However was re-admitted on 02/27/20 with a fever and need for workup and IV cefepime. Infectious workup was unrevealing, IV cefepime was stopped on 03/02/20, and she was once again discharged on 03/03/20.      Interval History: Overall feeling about the same. Very low energy which is worse when she needs a blood transfusion. Received 2 un Sunday and 1 un platelets. No visible bleeding - sometimes gums will bleed with no counts but not currently. Using a toothette or being very gentle with a soft toothbrush. Nausea is very well controlled with her current regimen of ondansetron 8mg , before AM and PM pills [although she would like to change from the odt to pills] and zyprexa at night. Experiences constipation - managing with as needed colace and miralax. Not having a great appetite but is eating several small meals a day and drinking fairly well. Coated tongue - uses nystatin S&S occasionally.     Her husband Susan Kane is encouraging walking, which they do several times a week in the evenings and they have been driving around in the car. Anemia makes her get winded easily consistent with CLL  ?? Enlarged left??inguinal lymph node January 2019,??small??neck/axillary nodes and palpable splenomegaly 09/11/2017  ?? CTs??on 09/17/2017-3 cm necrotic appearing lymph node in the left inguinal region, borderline enlarged pelvic/retroperitoneal, chest, and axillary nodes. ??Mild splenomegaly.  ?? Ultrasound-guided biopsy of the left inguinal lymph node 09/18/2017, slightly purulent fluid aspirated, core biopsy is consistent with an atypical lymphoid proliferation???extensive necrosis with surrounding epithelioid histiocytes, limited intact lymphoid tissue involved with CLL  ?? Incisional biopsy of a necrotic/purulent left inguinal lymph node on 10/01/2017???extensive necrosis with granulomatous inflammation, small amount of viable lymphoid tissue involved with CLL, AFB and fungal stains negative  ?? Peripheral blood FISH analysis 02/05/2018??? +deletion 13q14, no evidence of p53 (17p13) deletion, no evidence of 11q22??deletion  - normal karyotype (46,XX)  ?? Bone marrow biopsy 02/26/2018???hypercellular marrow with extensive involvement by CLL, lymphocytes represent??85% of all cells  ?? Ibrutinib initiated 04/03/2018  ?? Ibrutinib placed on hold 04/11/2018 due to onset of arthralgias  ?? Ibrutinib resumed 04/16/2018, discontinued 04/25/2018 secondary to severe arthralgias/arthritis  ?? Ibrutinib resumed at a dose of 140 mg daily 05/03/2018  ?? Ibrutinib dose adjusted to 140 mg alternating with 280 mg 06/25/2018  ?? 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 ??Augmentin prescribed.  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  Baseline pneumonia diagnosed 10/08/2017???completed 7 days of Levaquin  5.????Left lung pneumonia??on chest x-ray 12/27/2017. ??Augmentin prescribed.  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    Baseline BM bx reviewed at [facility]  Final Diagnosis   (Outside Case #:  [id number], dated 02/26/2018)  Bone marrow, aspiration and biopsy  -  Hypercellular bone marrow (80%) with extensive involvement by chronic lymphocytic leukemia (87% lymphocytes by manual aspirate differential)  (See Comment)  -  By outside report, cytogenetic results are normal     Karyotype: 32, XX and FISH with 13q del but no 11q or 17p del    Repeat BM bx: (done after being off ibrutinib x1 month)  80% involvement by CLL    08/13/18: Presents to [facility] to discuss plan of care     Chronic lymphocytic leukemia (CLL), B-cell (CMS-HCC)   07/12/2018 Initial Diagnosis    Chronic lymphocytic leukemia (CLL), B-cell (CMS-HCC)     08/26/2018 -  Chemotherapy    Acalabrutinib started (approximate date)     11/14/2018 -  Chemotherapy    Acalabrutinib discontinued due to progressive bone marrow failure     12/06/2018 -  Chemotherapy    Ritux x1 given due to CLL still making up majority of BM cellularity (though in the setting of near aplasia of rest of TLH)     03/19/2019 -  Chemotherapy    Equine ATG / CsA for aplastic anemia (suspect 2/t acalabrutinib)     Chronic lymphocytic leukemia of B-cell type not having achieved remission (CMS-HCC)   01/21/2019 Initial Diagnosis    Chronic lymphocytic leukemia of B-cell type not having achieved remission (CMS-HCC)     01/01/2020 -  Chemotherapy    BMT IP BENDAMUSTINE / FLUDARABINE / RITUXIMAB (OP RITUXIMAB Days -13 & -6) (MUD)  bendamustine 130 mg/m2 IV on days -5, -4, -3   fludarabine 30 mg/m2 IV on days -5, -4, -3   riTUXimab 375 mg/m2 IV on day -13 as a OP treatment day, then 1,000 mg/m2 on days -6 as a OP treatment day, +1, +8   tacrolimus, 0.045 mg/kg PO BID on days -3 and -2 then  0.03 mg/kg PO BID starting on day -1   methotrexate 5 mg/m2 IV on days +1, +3, +6, +11   tbo-filgrastim 300 mcg (<= 75 kg) / 480 mcg (> 75 kg) q24h starting on day +7        Patient Active Problem List   Diagnosis   ??? Chronic lymphocytic leukemia (CLL), B-cell (CMS-HCC)   ??? Hypogammaglobulinemia (CMS-HCC)   ??? Chronic lymphocytic leukemia of B-cell type not having achieved remission (CMS-HCC)   ??? Aplastic anemia (CMS-HCC)   ??? 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 Drug-induced nausea and vomiting   ??? Stem cell transplant candidate   ??? Chemotherapy induced neutropenia (CMS-HCC)   ??? History of allogeneic stem cell transplant (CMS-HCC)   ??? Osteopenia   ??? Neutropenia, drug-induced (CMS-HCC)   ??? Immunosuppressed status (CMS-HCC)     Review of Systems:  A full system review was performed and was negative except as noted in the above interval history.    Reviewed and updated past medical, surgical, social, and family history as appropriate.      No Known Allergies  Current Outpatient Medications   Medication Sig Dispense Refill   ??? calcium carbonate-vitamin D3 600 mg(1,500mg ) -800 unit Tab Take 1  tablet by mouth Two (2) times a day. (Patient not taking: Reported on 03/05/2020) 60 tablet 5   ??? eltrombopag (PROMACTA) 50 MG tablet Take 2 tablets (100 mg total) by mouth daily. Administer on an empty stomach, 1 hour before or 2 hours after a meal. 60 tablet 2   ??? famotidine (PEPCID) 20 MG tablet Take 1 tablet (20 mg total) by mouth two (2) times a day as needed for heartburn. 60 tablet 0   ??? folic acid (FOLVITE) 1 MG tablet Take 1 tablet (1 mg total) by mouth at bedtime. 30 tablet 5   ??? levothyroxine (SYNTHROID) 100 MCG tablet Take 1 tablet (100 mcg total) by mouth daily. 30 tablet 5   ??? lithium (LITHOBID) 300 MG ER tablet Take 1 tablet (300 mg total) by mouth at bedtime. 30 tablet 5   ??? magnesium oxide-Mg AA chelate (MAGNESIUM, AMINO ACID CHELATE,) 133 mg Tab Take 1 tablet by mouth Three (3) times a day. 100 tablet 2   ??? nystatin (MYCOSTATIN) 100,000 unit/mL suspension Take 5 mL (500,000 Units total) by mouth Four (4) times a day. 60 mL 0   ??? ondansetron (ZOFRAN) 8 MG tablet Take 1 tablet (8 mg total) by mouth every twelve (12) hours as needed for nausea. 30 tablet 2   ??? polyethylene glycol (MIRALAX) 17 gram packet Take 17 g by mouth daily as needed (constipation).     ??? posaconazole (NOXAFIL) 100 mg TbEC delayed released tablet Take 3 tablets (300 mg total) by mouth daily. 90 tablet 1   ??? senna-docusate (SENNOSIDES-DOCUSATE SODIUM) 8.6-50 mg Take 1 tablet by mouth at bedtime.     ??? tacrolimus (PROGRAF) 0.5 MG capsule Take 3 capsules (1.5 mg total) by mouth two (2) times a day. 180 capsule 5   ??? TRINTELLIX 10 mg tablet Take 1 tablet (10 mg total) by mouth at bedtime. 30 tablet 5   ??? valGANciclovir (VALCYTE) 450 mg tablet Take 2 tablets (900 mg total) by mouth Two (2) times a day. 120 tablet 0   ??? zolpidem (AMBIEN) 10 mg tablet Take 1 tablet (10 mg total) by mouth nightly as needed for sleep. 20 tablet 0     No current facility-administered medications for this visit.     Objective:    There were no vitals filed for this visit.    Wt Readings from Last 1 Encounters:   03/15/20 56.2 kg (123 lb 12.8 oz)     Temp Readings from Last 1 Encounters:   03/15/20 36.4 ??C (97.5 ??F) (Oral)     BP Readings from Last 1 Encounters:   03/15/20 114/65     Pulse Readings from Last 1 Encounters:   03/15/20 80     SpO2 Readings from Last 1 Encounters:   03/15/20 100%     Physical Exam:  General appearance - appears well, no distress noted  Mental status - affect appropriate to mood  Eyes - conjunctivae clear, sclera anicteric   ENT - mucous membranes moist, no ulcerations noted or erythema, normal non-tender sinuses   Neck - supple, no palpable LAD  Chest - lungs are clear to auscultation bilaterally without wheezes/rales/rhonchi   Heart - regular rate and rhythm without murmur, rub, or gallop   Abdomen - soft, bowel sounds within normal limits, no masses, no tenderness, no hepatosplenomegaly   Neurological - motor and sensory grossly normal bilaterally  Musculoskeletal - no joint tenderness, deformity or swelling, no muscular tenderness noted  Extremities - no pedal edema  noted  Skin - no skin breakdown noted, no rash   Venous access - line site is non-tender, no erythema/drainage    Karnofsky/Lansky Performance Status  80, Normal activity with effort; some signs or symptoms of disease (ECOG equivalent 1)    Test Results:   Reviewed in EPIC. Abnormal values discussed below.     Assessment/Plan:  Mrs. Augenstein is 59 yo woman with CLL as well as AA/bone marrow failure.  ??  CLL: CT C/A/P on 10/10/19 revealed no evidence of LAD or organomegaly. Possible endometrial fluid or thickening in the fundus of the uterus.  10/07/19 BMBx: Scant bone marrow sampling (<5% cellular) essentially devoid of hematopoietic elements.  - ??Persistent CLL representing 23% of marrow cellularity by flow cytometric analysis.  -  Corresponding CBC: WBC 1.0, ANC 0.1.  ????????  ??  12/26/19: BmBx:   - ??Hypocellular (20% overall) involved by chronic lymphocytic leukemia/small lymphocytic lymphoma, representing approximately 20% of marrow cells by PAX5 immunohistochemistry.  - Normal karyotype: 46,XX[20]  ??  BMT:??SD (stable disease)  HCT-CI (age adjusted) 5??(active CLL, psych, age adjusted)    Conditioning:  1. Rituximab 375 mg/m2 on day -13 and 1000 mg/m2 on D-6, +1, and +8  2. Bendamustine 130 mg/m2 IV daily over 10 minutes on D-5 through -3  3. Fludarabine 30 mg/m2 IV daily over 30 minutes on D-5 through -3  ??  Donor: 10/10, ABO identical; O - , CMV negative  Cell dose: 5.51x10^6 cells/kg    Engraftment:   - Granix D+12 through WBC recovery (ANC 1.0 x 2 days or 3.0 x 1 day)  - Viral studies showed weakly positive EBV, CMV, HHV-6, plan to monitor for now  - D+30 BMBx: markedly hypocellular marrow (<5%) with essentially absent hematopoiesis.   - Chimerisms:       PB UF PB CD3 BM UF BM CD3   02/09/20 63% donor Insufficient CD3 cells to quantify      02/10/20   83% Donor Insufficient CD3 cells to quantify     - ??Routine, 30d post transplant DNA analysis is pending.     GvHD prophylaxis:   1. Tacrolimus goal 5-10, dose adjustments per BMT pharmacy.   ??** Current dose 1.5mg  BID  2. Completed Methotrexate 5 mg/m2 on D+1, +3, +6 and +11  3. rATG 1 mg/kg on D-2 and -1 included  ??  Heme:  Transfusion criteria: ABO identical donor [O-]   - Standard txf criteria: 1 unit of PRBCs for Hgb<7 and 1 unit of platelets for Plt <10K or bleeding.     ** No history of transfusion reactions.   - Granix for WBC of 1 or less [given 8/9, 03/03/20]   - Udenyca 6mg  x1 given 03/05/20  - Has been requiring PRBCs and platelets [last transfused 03/14/20].   03/15/20: Remains with cytopenias [CMV vs medication effect]. WBC/ANC improved slightly today. D+60 DNA studies sent today.     ID:????  Prophylaxis:  - Antiviral: Valcyte as below for CMV viremia.   - Antifungal: Posaconazole 300 mg daily  - Antibacterial:??If ANC remains <0.5 on next lab check, will start Cefdinir.    - PJP: S/p pentamidine on 02/21/20, can replace with Bactrim once platelets engraft. For now will continue with monthly Pentamidine next on 03/22/20.    CMV:  02/24/20: CMV 207  03/02/20: CMV 884. Valganciclovir started.    03/08/20: CMV decreased after 1 week of therapy to 140.  03/15/20: Repeat pending, if <50 can go to maintenance dosing.  **  Letermovir denied. Appeal in progress.   ??  HHV-6:   - Peaked on PCR at 41.2, now negative.   ??  EBV: negative,  - Weekly, pending     Neutropenic fever  03/02/20 Fever at home 100.4 and low grade in ER of 99.5. ??  - Cx NGTD  - IV cefepime (8/8-8/10)  - Now afebrile.   ??  GI:??  GERD Prophylaxis: Pepcid.  This is now PRN.   ??  CINV:  - Anti-emetics per BMT protocol.   - Zofran 8mg  prn [taking bid]  - Nightly zyprexa.     Coated Tongue: - Nystatin solution PRN.   ??  Renal:   - No active issues.   ??  FEN:   - Continue folic acid.    Hypomagnesemia:  - Mag Chelate 1 tablet TID.   ??  Endocrine:  Hypothyroid:??  - Continue Synthroid daily.   - TSH, T3, T4 - normal   ??  Hepatic:??  - VOD prophylaxis not warranted as non-myeloablative regimen.   ??  Hepatocellular liver injury pattern:  - Mild increase in AST, ALT, and AlkPhos, likely secondary to posaconazole. Stable today.   ??  CV:  HTN: Now normotensive off treatment.   ??  Pulm:  AHRF: First noted on 7/22. Intermittent O2 requirement with exam findings concerning for LLL crackles. No signs of volume overload on exam. Chest-X ray clear. Now on RA.     Neuro/Pain:??  - No c/o at this time    Psych:??Has a home psychiatrist.   Depression/Anxiety:   - Per outpt note pt was taking Xanax 0.25 mg BID PRN for anxiety but this was not on her home med list thus I did not re-order at this time. Discussed with patient who stated she rarely takes.  - Lithium 300mg  nightly   03/15/20: Checking with home prescriber to see if they want [place] to check a level; [last 12/2019]   - Trintellix 10mg  nightly  ??  Insomnia:  - Ambien 10mg  nightly PRN  ??  Caregiver/Lodging:  - Husband Susan Kane will be primary caregiver  - Staying at local rental.     Myra Rude, ANP  Bone Marrow Transplant and Cellular Therapy Program    I personally spent *** minutes face-to-face and non-face-to-face in the care of this patient, which includes all pre, intra, and post visit time on the date of service.

## 2020-03-17 NOTE — Unmapped (Addendum)
Decrease valcyte to 1 pill twice a day.     Drink a minimum of 2 liters per day.     Call if you develop any bleeding-gums, nose, urine or stool.    I will call you if I need to make changes to your tacrolimus dose.     Lab Results   Component Value Date    WBC 1.2 (L) 03/17/2020    HGB 8.2 (L) 03/17/2020    HCT 22.8 (L) 03/17/2020    PLT 12 (L) 03/17/2020     Lab Results   Component Value Date    NA 139 03/17/2020    K 5.2 (H) 03/17/2020    CL 110 (H) 03/17/2020    CO2 22.0 03/17/2020    BUN 19 03/17/2020    CREATININE 0.89 (H) 03/17/2020    GLU 148 03/17/2020    CALCIUM 9.3 03/17/2020    MG 2.1 03/17/2020    PHOS 3.6 03/01/2020     Lab Results   Component Value Date    BILITOT 0.5 03/17/2020    BILIDIR 0.30 03/01/2020    PROT 6.1 03/17/2020    ALBUMIN 3.7 03/17/2020    ALT 35 03/17/2020    AST 25 03/17/2020    ALKPHOS 149 (H) 03/17/2020    GGT 22 01/09/2020     Lab Results   Component Value Date    INR 1.62 03/01/2020    INR 1.61 03/01/2020    APTT 31.9 03/01/2020    APTT 32.7 03/01/2020       For prescription refills:   For refills, please check your medication bottles to see if you have additional refills left. If so, please call your pharmacy and follow the directions to request a refill. If you do not have any refills left, please make a request during your clinic visit or by submitting a request through MyChart or by calling [phone]. Please allow 24 hours if your request is made during the week or 48 hours if requests are made on the weekends or holidays.     --------------------------------------------------------------------------------------------------------------------  For appointments & questions Monday through Friday 8 AM-4:30 PM     Please call [phone] or Toll free [phone]    On Nights, Weekends and Holidays  Call [phone] and ask for the oncologist on call    Please visit PrivacyFever.cz, a resource created just for family members and caregivers.  This website lists support services, how and where to ask for help. It has tools to assist you as you help [place] care for your loved one.    [facility]  [address]  [place], [place] [address]  [email or web address]

## 2020-03-17 NOTE — Unmapped (Signed)
Bone Marrow Transplant and Cellular Therapy Program  Immunosuppressive Therapy Note    Susan Kane is a 59 y.o. 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 Susan Kane is currently day +63.    Current dose: tacrolimus 1.5 mg PO BID    Goal tacrolimus Level: 5-10 ng/mL    Resulted level: 4.9 ng/mL (drawn late morning and true trough within target range)    Lab Results   Component Value Date/Time    TACROLIMUS 4.9 (L) 03/17/2020 11:18 AM    TACROLIMUS 5.7 03/15/2020 09:38 AM    TACROLIMUS 6.0 03/12/2020 01:10 PM    TACROLIMUS 7.6 03/10/2020 07:31 AM     Lab Results   Component Value Date/Time    CREATININE 0.89 (H) 03/17/2020 11:18 AM    CREATININE 0.81 (H) 03/15/2020 09:38 AM    CREATININE 0.96 (H) 03/14/2020 09:28 AM    CREATININE 0.80 03/12/2020 01:10 PM      Assessment/Recommendation: Tacrolimus level remains therapeutic. Her SCr is overall stable. Alk phos is mildly elevated and TBili/LFTs are WNL. Given the above, plan to continue current tacrolimus dose of 1.5 mg PO BID and will recheck at next clinic visit to further assess trend and need for adjustment.     We will continue to monitor levels.  Patient will be followed for changes in renal and hepatic function, toxicity, and efficacy.     Rulon Abide, PharmD CPP  [facility] Marrow Transplant [facility] Cellular Therapy Program

## 2020-03-18 DIAGNOSIS — Z9484 Stem cells transplant status: Principal | ICD-10-CM

## 2020-03-18 LAB — EBV QUANTITATIVE PCR, BLOOD: EBV VIRAL LOAD RESULT: NOT DETECTED

## 2020-03-18 NOTE — Unmapped (Signed)
BMT-CT Clinic Follow-up    Patient Name: Susan Kane  MRN: [id number]  Encounter date: 03/19/2020    Referring Physician: Dr. Jillyn Hidden Nida Boatman) [name]  Primary Care Provider: Thora Lance, MD  BMT Attending MD: Dr. Oswald Hillock    Disease: CLL  Current disease status: SD (stable disease)  Type of Transplant: RIC MUD Allo  Graft Source: Cryopreserved PBSCs  Transplant Day: +1     Study Participant: BMT CTN 1704: Composite Health Assessment Risk Model for Older Adults: Applying Pre-transplant Comorbidity, Geriatric Assessment and Biomarkers to Predict Non-Relapse Mortality After Allogeneic Transplant (CHARM).  ??  HPI:  Susan Kane??is a 58 y.o.??woman??with a diagnosis of CLL??and aplastic anemia who is now s/p RIC MUD AlloSCT. Her transplant course was complicated by delayed engraftment. ??She was initially discharged from the hospital on 02/23/2020. However was re-admitted on 02/27/20 with a fever and need for workup and IV cefepime. Infectious workup was unrevealing, IV cefepime was stopped on 03/02/20, and she was once again discharged on 03/03/20.      Interval History:   Susan Kane is here for routine follow up after transplant. She is starting to feel a bit more recovered.  PO intake is better. Energy is a little better. Having constipation and has been taking stool softeners on PRN basis. Planning to be more on a regular basis moving forward. No pain. Nausea is under control with zofran.  No c/o rash. Skin is dry.  No e/o bleeding.     Oncology History Overview Note   CLL:    Summary as per Dr. Kalman Drape most recent note with minor edits/additions from my review of records    1.??CLL?????diagnosed in August 2010, flow cytometry consistent with CLL  ?? Enlarged left??inguinal lymph node January 2019,??small??neck/axillary nodes and palpable splenomegaly 09/11/2017  ?? CTs??on 09/17/2017-3 cm necrotic appearing lymph node in the left inguinal region, borderline enlarged pelvic/retroperitoneal, chest, and axillary nodes. ??Mild splenomegaly.  ?? Ultrasound-guided biopsy of the left inguinal lymph node 09/18/2017, slightly purulent fluid aspirated, core biopsy is consistent with an atypical lymphoid proliferation???extensive necrosis with surrounding epithelioid histiocytes, limited intact lymphoid tissue involved with CLL  ?? Incisional biopsy of a necrotic/purulent left inguinal lymph node on 10/01/2017???extensive necrosis with granulomatous inflammation, small amount of viable lymphoid tissue involved with CLL, AFB and fungal stains negative  ?? Peripheral blood FISH analysis 02/05/2018??? +deletion 13q14, no evidence of p53 (17p13) deletion, no evidence of 11q22??deletion  - normal karyotype (46,XX)  ?? Bone marrow biopsy 02/26/2018???hypercellular marrow with extensive involvement by CLL, lymphocytes represent??85% of all cells  ?? Ibrutinib initiated 04/03/2018  ?? Ibrutinib placed on hold 04/11/2018 due to onset of arthralgias  ?? Ibrutinib resumed 04/16/2018, discontinued 04/25/2018 secondary to severe arthralgias/arthritis  ?? Ibrutinib resumed at a dose of 140 mg daily 05/03/2018  ?? Ibrutinib dose adjusted to 140 mg alternating with 280 mg 06/25/2018  ?? 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 ??Augmentin prescribed.  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    Baseline BM bx reviewed at [facility]  Final Diagnosis   (Outside Case #:  [id number], dated 02/26/2018)  Bone marrow, aspiration and biopsy  -  Hypercellular bone marrow (80%) with extensive involvement by chronic lymphocytic leukemia (87% lymphocytes by  manual aspirate differential)  (See Comment)  -  By outside report, cytogenetic results are normal     Karyotype: 70, XX and FISH with 13q del but no 11q or 17p del    Repeat BM bx: (done after being off ibrutinib x1 month)  80% involvement by CLL    08/13/18: Presents to [facility] to discuss plan of care     Chronic lymphocytic leukemia (CLL), B-cell (CMS-HCC)   07/12/2018 Initial Diagnosis    Chronic lymphocytic leukemia (CLL), B-cell (CMS-HCC)     08/26/2018 -  Chemotherapy    Acalabrutinib started (approximate date)     11/14/2018 -  Chemotherapy    Acalabrutinib discontinued due to progressive bone marrow failure     12/06/2018 -  Chemotherapy    Ritux x1 given due to CLL still making up majority of BM cellularity (though in the setting of near aplasia of rest of TLH)     03/19/2019 -  Chemotherapy    Equine ATG / CsA for aplastic anemia (suspect 2/t acalabrutinib)     Chronic lymphocytic leukemia of B-cell type not having achieved remission (CMS-HCC)   01/21/2019 Initial Diagnosis    Chronic lymphocytic leukemia of B-cell type not having achieved remission (CMS-HCC)     01/01/2020 -  Chemotherapy    BMT IP BENDAMUSTINE / FLUDARABINE / RITUXIMAB (OP RITUXIMAB Days -13 & -6) (MUD)  bendamustine 130 mg/m2 IV on days -5, -4, -3   fludarabine 30 mg/m2 IV on days -5, -4, -3   riTUXimab 375 mg/m2 IV on day -13 as a OP treatment day, then 1,000 mg/m2 on days -6 as a OP treatment day, +1, +8   tacrolimus, 0.045 mg/kg PO BID on days -3 and -2 then  0.03 mg/kg PO BID starting on day -1   methotrexate 5 mg/m2 IV on days +1, +3, +6, +11   tbo-filgrastim 300 mcg (<= 75 kg) / 480 mcg (> 75 kg) q24h starting on day +7        Patient Active Problem List   Diagnosis   ??? Chronic lymphocytic leukemia (CLL), B-cell (CMS-HCC)   ??? Hypogammaglobulinemia (CMS-HCC)   ??? Chronic lymphocytic leukemia of B-cell type not having achieved remission (CMS-HCC)   ??? Aplastic anemia (CMS-HCC)   ??? 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 Drug-induced nausea and vomiting   ??? Stem cell transplant candidate ??? Chemotherapy induced neutropenia (CMS-HCC)   ??? History of allogeneic stem cell transplant (CMS-HCC)   ??? Osteopenia   ??? Neutropenia, drug-induced (CMS-HCC)   ??? Immunosuppressed status (CMS-HCC)     Review of Systems:  A full system review was performed and was negative except as noted in the above interval history.    Reviewed and updated past medical, surgical, social, and family history as appropriate.      No Known Allergies  Current Outpatient Medications   Medication Sig Dispense Refill   ??? calcium carbonate-vitamin D3 600 mg(1,500mg ) -800 unit Tab Take 1 tablet by mouth Two (2) times a day. (Patient not taking: Reported on 03/05/2020) 60 tablet 5   ??? eltrombopag (PROMACTA) 50 MG tablet Take 2 tablets (100 mg total) by mouth daily. Administer on an empty stomach, 1 hour before or 2 hours after a meal. 60 tablet 2   ??? famotidine (PEPCID) 20 MG tablet Take 1 tablet (20 mg total) by mouth two (2) times a day as needed for heartburn. 60 tablet 0   ??? folic acid (FOLVITE) 1 MG tablet Take  1 tablet (1 mg total) by mouth at bedtime. 30 tablet 5   ??? levothyroxine (SYNTHROID) 100 MCG tablet Take 1 tablet (100 mcg total) by mouth daily. 30 tablet 5   ??? lithium (LITHOBID) 300 MG ER tablet Take 1 tablet (300 mg total) by mouth at bedtime. 30 tablet 5   ??? magnesium oxide-Mg AA chelate (MAGNESIUM, AMINO ACID CHELATE,) 133 mg Tab Take 1 tablet by mouth two (2) times a day. 100 tablet 2   ??? nystatin (MYCOSTATIN) 100,000 unit/mL suspension Take 5 mL (500,000 Units total) by mouth Four (4) times a day. 60 mL 0   ??? ondansetron (ZOFRAN) 8 MG tablet Take 1 tablet (8 mg total) by mouth every twelve (12) hours as needed for nausea. 30 tablet 2   ??? polyethylene glycol (MIRALAX) 17 gram packet Take 17 g by mouth daily as needed (constipation).     ??? posaconazole (NOXAFIL) 100 mg TbEC delayed released tablet Take 3 tablets (300 mg total) by mouth daily. 90 tablet 1   ??? senna-docusate (SENNOSIDES-DOCUSATE SODIUM) 8.6-50 mg Take 1 tablet by mouth at bedtime.     ??? tacrolimus (PROGRAF) 0.5 MG capsule Take 3 capsules (1.5 mg total) by mouth two (2) times a day. 180 capsule 5   ??? TRINTELLIX 10 mg tablet Take 1 tablet (10 mg total) by mouth at bedtime. 30 tablet 5   ??? valGANciclovir (VALCYTE) 450 mg tablet Take 1 tablet (450 mg total) by mouth two (2) times a day. 120 tablet 0   ??? zolpidem (AMBIEN) 5 MG tablet Take 1 to 2 tablets (5 mg-10 mg) as needed for insomnia. 60 tablet 0     No current facility-administered medications for this visit.     Facility-Administered Medications Ordered in Other Visits   Medication Dose Route Frequency Provider Last Rate Last Admin   ??? heparin, porcine (PF) 100 unit/mL injection 200 Units  200 Units Intravenous Q30 Min PRN Pernell Dupre, MD   200 Units at 03/19/20 1220     Objective:    Vitals:    03/19/20 1232   BP: 106/67   Pulse: 77   Resp: 16   Temp: 36.5 ??C (97.7 ??F)   TempSrc: Oral   SpO2: 100%   Weight: 55.1 kg (121 lb 6.4 oz)   Height: 165.1 cm (5' 5)     Physical Exam:  General appearance - appears well, no distress noted  Mental status - affect appropriate to mood  Eyes - conjunctivae clear, sclera anicteric   ENT - mucous membranes moist, no ulcerations noted or erythema, normal non-tender sinuses. Tongue is coated  Neck - supple, no palpable LAD  Chest - lungs are clear to auscultation bilaterally without wheezes/rales/rhonchi   Heart - regular rate and rhythm without murmur, rub, or gallop   Abdomen - soft, bowel sounds within normal limits, no masses, no tenderness, no hepatosplenomegaly   Neurological - motor and sensory grossly normal bilaterally  Musculoskeletal - no joint tenderness, deformity or swelling, no muscular tenderness noted  Extremities - no pedal edema noted  Skin - no skin breakdown noted, no rash dry  Venous access - line site is non-tender, no erythema/drainage    Karnofsky/Lansky Performance Status  80, Normal activity with effort; some signs or symptoms of disease (ECOG equivalent 1)    Test Results:   Reviewed in EPIC. Abnormal values discussed below.     Assessment/Plan:  Susan Kane is 59 yo woman with CLL as well as AA/bone marrow  failure.  ??  CLL: CT C/A/P on 10/10/19 revealed no evidence of LAD or organomegaly. Possible endometrial fluid or thickening in the fundus of the uterus.  10/07/19 BMBx: Scant bone marrow sampling (<5% cellular) essentially devoid of hematopoietic elements.  - ??Persistent CLL representing 23% of marrow cellularity by flow cytometric analysis.  -  Corresponding CBC: WBC 1.0, ANC 0.1.  ????????  ??  12/26/19: BmBx:   - ??Hypocellular (20% overall) involved by chronic lymphocytic leukemia/small lymphocytic lymphoma, representing approximately 20% of marrow cells by PAX5 immunohistochemistry.  - Normal karyotype: 46,XX[20]    02/10/20: BmBx:  -Markedly hypocellular bone marrow (< 5%) with essentially absent hematopoiesis. Normal Recipient and Donor karyotype:  46,XX[2]//46,XY[7].       BMT:??SD (stable disease)  HCT-CI (age adjusted) 5??(active CLL, psych, age adjusted)    Conditioning:  1. Rituximab 375 mg/m2 on day -13 and 1000 mg/m2 on D-6, +1, and +8  2. Bendamustine 130 mg/m2 IV daily over 10 minutes on D-5 through -3  3. Fludarabine 30 mg/m2 IV daily over 30 minutes on D-5 through -3  ??  Donor: 10/10, ABO identical; O - , CMV negative  Cell dose: 5.51x10^6 cells/kg    Engraftment:   - Granix D+12 through WBC recovery (ANC 1.0 x 2 days or 3.0 x 1 day)  - Viral studies showed weakly positive EBV, CMV, HHV-6, plan to monitor for now  - D+30 BMBx: markedly hypocellular marrow (<5%) with essentially absent hematopoiesis.   Chimerisms:     Ref. Range 02/09/2020 00:23 02/10/2020 14:45 03/08/2020 13:49 03/08/2020 13:49   Specimen Type Unknown Blood Bone Marrow Blood Blood   T-Cell (CD3) Recipient: Latest Units: %    >95   T-Cell (CD3) Donor: Latest Units: %    <5   Unfract. Recipient: Latest Units: % 37 17 14    Unfract. Donor: Latest Units: % 63 83 86    -03/17/20: PB chimerisms pending from 8/23    GvHD prophylaxis:   1. Tacrolimus goal 5-10, dose adjustments per BMT pharmacy.   ??** Current dose 1.5mg  BID  2. Completed Methotrexate 5 mg/m2 on D+1, +3, +6 and +11  3. rATG 1 mg/kg on D-2 and -1 included  ??  Heme:  Transfusion criteria: ABO identical donor [O-]   - Standard txf criteria: 1 unit of PRBCs for Hgb<7 and 1 unit of platelets for Plt <10K or bleeding.     ** No history of transfusion reactions.   - Granix for WBC of 1 or less [given 8/9, 03/03/20]   - Udenyca 6mg  x1 given 03/05/20  - Has been requiring PRBCs and platelets [last transfused 03/14/20].   03/15/20: Remains with cytopenias [CMV vs medication effect]. WBC/ANC improved slightly today. D+60 DNA studies sent today.   8/27: DNA still pending. We did not get a lasting response to udenyca 2 weeks ago. She has been neutropenic for the last week.   We did not layer granix on top of the undenyca. Now that 2 weeks has passed we will start granix. 300 mcg given today.  Plan for blood products in infusion on Sunday.     ID:????  Prophylaxis:  - Antiviral: Valcyte as below for CMV viremia.   - Antifungal: Posaconazole 300 mg daily  - Antibacterial:??If ANC remains <0.5 on next lab check, will start Cefdinir.    - PJP: S/p pentamidine on 02/21/20, can replace with Bactrim once platelets engraft. For now will continue with monthly Pentamidine next on 03/22/20.    CMV:  02/24/20: CMV 207  03/02/20: CMV 884. Valganciclovir started.    03/08/20: CMV decreased after 1 week of therapy to 140.  03/15/20: Repeat pending, if <50 can go to maintenance dosing.  03/17/20: Last CMV <50. Decreased valcyte to 450 mg BID.  ** Letermovir denied. Appeal in progress.    8/27: Maintenance dosing ends on 9/8  ??  HHV-6:   - Peaked on PCR at 41.2, now negative.   ??  EBV: negative,  - Weekly, pending     Neutropenic fever  03/02/20 Fever at home 100.4 and low grade in ER of 99.5. ??  - Cx NGTD  - IV cefepime (8/8-8/10)  - Now afebrile.   ??  GI:??  GERD Prophylaxis: Pepcid.  This is now PRN.   ??  CINV:  - Anti-emetics per BMT protocol.   - Zofran 8mg  prn [taking bid]  - Nightly zyprexa.     Coated Tongue:   - Nystatin solution PRN.   ??  Renal:   - No active issues.   ??  FEN:   - Continue folic acid.    Hypomagnesemia:  - Mag Chelate 1 tablet BID.   ??  Endocrine:  Hypothyroid:??  - Continue Synthroid daily.   - TSH, T3, T4 - normal   ??  Hepatic:??  - VOD prophylaxis not warranted as non-myeloablative regimen.   ??  Hepatocellular liver injury pattern:  - Mild increase in AST, ALT, and AlkPhos, likely secondary to posaconazole. Stable today.   ??  CV:  HTN: Now normotensive off treatment.   ??  Pulm:  AHRF: First noted on 7/22. Intermittent O2 requirement with exam findings concerning for LLL crackles. No signs of volume overload on exam. Chest-X ray clear. Now on RA.     Neuro/Pain:??  - No c/o at this time    Psych:??Has a home psychiatrist.   Depression/Anxiety:   - Per outpt note pt was taking Xanax 0.25 mg BID PRN for anxiety but this was not on her home med list thus I did not re-order at this time. Discussed with patient who stated she rarely takes.  - Lithium 300mg  nightly   03/15/20: Checking with home prescriber to see if they want [place] to check a level; [last 12/2019]   - Trintellix 10mg  nightly  ??  Insomnia:  - Ambien 10mg  nightly PRN  ??  Caregiver/Lodging:  - Husband [name] will be primary caregiver  - Staying at local rental.     Dimple Nanas, ANP  [facility] Trans[facility] Cellular Therapy Program    I personally spent 40 minutes face-to-face and non-face-to-face in the care of this patient, which includes all pre, intra, and post visit time on the date of service.

## 2020-03-19 ENCOUNTER — Ambulatory Visit: Admit: 2020-03-19 | Discharge: 2020-03-20 | Payer: PRIVATE HEALTH INSURANCE

## 2020-03-19 ENCOUNTER — Other Ambulatory Visit: Admit: 2020-03-19 | Discharge: 2020-03-20 | Payer: PRIVATE HEALTH INSURANCE

## 2020-03-19 ENCOUNTER — Encounter: Admit: 2020-03-19 | Discharge: 2020-03-19 | Payer: PRIVATE HEALTH INSURANCE

## 2020-03-19 DIAGNOSIS — E039 Hypothyroidism, unspecified: Secondary | ICD-10-CM | POA: Diagnosis not present

## 2020-03-19 DIAGNOSIS — D801 Nonfamilial hypogammaglobulinemia: Secondary | ICD-10-CM | POA: Diagnosis not present

## 2020-03-19 DIAGNOSIS — I10 Essential (primary) hypertension: Secondary | ICD-10-CM | POA: Diagnosis not present

## 2020-03-19 DIAGNOSIS — Z9221 Personal history of antineoplastic chemotherapy: Secondary | ICD-10-CM | POA: Diagnosis not present

## 2020-03-19 DIAGNOSIS — C9110 Chronic lymphocytic leukemia of B-cell type not having achieved remission: Secondary | ICD-10-CM | POA: Diagnosis not present

## 2020-03-19 DIAGNOSIS — B1910 Unspecified viral hepatitis B without hepatic coma: Secondary | ICD-10-CM | POA: Diagnosis not present

## 2020-03-19 DIAGNOSIS — R161 Splenomegaly, not elsewhere classified: Secondary | ICD-10-CM | POA: Diagnosis not present

## 2020-03-19 DIAGNOSIS — D619 Aplastic anemia, unspecified: Secondary | ICD-10-CM | POA: Diagnosis not present

## 2020-03-19 DIAGNOSIS — Z79890 Hormone replacement therapy (postmenopausal): Secondary | ICD-10-CM | POA: Diagnosis not present

## 2020-03-19 DIAGNOSIS — Z6820 Body mass index (BMI) 20.0-20.9, adult: Secondary | ICD-10-CM | POA: Diagnosis not present

## 2020-03-19 DIAGNOSIS — R590 Localized enlarged lymph nodes: Secondary | ICD-10-CM | POA: Diagnosis not present

## 2020-03-19 DIAGNOSIS — Z9484 Stem cells transplant status: Secondary | ICD-10-CM | POA: Diagnosis not present

## 2020-03-19 LAB — CBC W/ AUTO DIFF
BASOPHILS ABSOLUTE COUNT: 0 10*9/L (ref 0.0–0.1)
BASOPHILS RELATIVE PERCENT: 0.6 %
EOSINOPHILS ABSOLUTE COUNT: 0 10*9/L (ref 0.0–0.4)
EOSINOPHILS RELATIVE PERCENT: 0.5 %
HEMATOCRIT: 25.2 % — ABNORMAL LOW (ref 36.0–46.0)
HEMOGLOBIN: 8.4 g/dL — ABNORMAL LOW (ref 12.0–16.0)
LARGE UNSTAINED CELLS: 5 % — ABNORMAL HIGH (ref 0–4)
LYMPHOCYTES ABSOLUTE COUNT: 0.3 10*9/L — ABNORMAL LOW (ref 1.5–5.0)
LYMPHOCYTES RELATIVE PERCENT: 28.4 %
MEAN CORPUSCULAR HEMOGLOBIN CONC: 33.5 g/dL (ref 31.0–37.0)
MEAN CORPUSCULAR HEMOGLOBIN: 26.4 pg (ref 26.0–34.0)
MEAN CORPUSCULAR VOLUME: 79 fL — ABNORMAL LOW (ref 80.0–100.0)
MEAN PLATELET VOLUME: 12.9 fL — ABNORMAL HIGH (ref 7.0–10.0)
MONOCYTES ABSOLUTE COUNT: 0.1 10*9/L — ABNORMAL LOW (ref 0.2–0.8)
MONOCYTES RELATIVE PERCENT: 5.9 %
NEUTROPHILS ABSOLUTE COUNT: 0.6 10*9/L — ABNORMAL LOW (ref 2.0–7.5)
NEUTROPHILS RELATIVE PERCENT: 60.1 %
PLATELET COUNT: 14 10*9/L — ABNORMAL LOW (ref 150–440)
RED BLOOD CELL COUNT: 3.18 10*12/L — ABNORMAL LOW (ref 4.00–5.20)
RED CELL DISTRIBUTION WIDTH: 15.5 % — ABNORMAL HIGH (ref 12.0–15.0)
WBC ADJUSTED: 1.1 10*9/L — ABNORMAL LOW (ref 4.5–11.0)

## 2020-03-19 LAB — COMPREHENSIVE METABOLIC PANEL
ALBUMIN: 4.1 g/dL (ref 3.4–5.0)
ALKALINE PHOSPHATASE: 155 U/L — ABNORMAL HIGH (ref 46–116)
ALT (SGPT): 53 U/L — ABNORMAL HIGH (ref 10–49)
ANION GAP: 8 mmol/L (ref 5–14)
AST (SGOT): 38 U/L — ABNORMAL HIGH (ref <=34)
BILIRUBIN TOTAL: 0.6 mg/dL (ref 0.3–1.2)
BLOOD UREA NITROGEN: 27 mg/dL — ABNORMAL HIGH (ref 9–23)
BUN / CREAT RATIO: 24
CALCIUM: 9.2 mg/dL (ref 8.7–10.4)
CHLORIDE: 106 mmol/L (ref 98–107)
CO2: 23 mmol/L (ref 20.0–31.0)
CREATININE: 1.11 mg/dL — ABNORMAL HIGH
EGFR CKD-EPI AA FEMALE: 62 mL/min/{1.73_m2} (ref >=60)
EGFR CKD-EPI NON-AA FEMALE: 54 mL/min/{1.73_m2} — ABNORMAL LOW (ref >=60)
GLUCOSE RANDOM: 165 mg/dL (ref 70–179)
POTASSIUM: 5 mmol/L — ABNORMAL HIGH (ref 3.4–4.5)
PROTEIN TOTAL: 6.6 g/dL (ref 5.7–8.2)
SODIUM: 137 mmol/L (ref 135–145)

## 2020-03-19 LAB — CMV DNA, QUANTITATIVE, PCR
CMV QUANT: <50 [IU]/mL — ABNORMAL HIGH (ref <0)
CMV VIRAL LD: DETECTED — AB

## 2020-03-19 LAB — SLIDE REVIEW

## 2020-03-19 LAB — MAGNESIUM: MAGNESIUM: 2.1 mg/dL (ref 1.6–2.6)

## 2020-03-19 MED ORDER — MG-PLUS-PROTEIN 133 MG TABLET
ORAL_TABLET | Freq: Two times a day (BID) | ORAL | 2 refills | 50.00000 days | Status: CP
Start: 2020-03-19 — End: ?

## 2020-03-19 MED ADMIN — heparin, porcine (PF) 100 unit/mL injection 200 Units: 200 [IU] | INTRAVENOUS | @ 16:00:00 | Stop: 2020-03-19

## 2020-03-19 MED ADMIN — tbo-filgrastim (GRANIX) injection 300 mcg: 300 ug | SUBCUTANEOUS | @ 18:00:00 | Stop: 2020-03-19

## 2020-03-19 NOTE — Unmapped (Signed)
Changed patient's central line dressing (TLH) due to dressing peeling up.  Educated patient on dressing change.  Also GCSF given subcutaneous at this time in right lower abd.  Educated patient on drug given.  Estimated time educating patient 7 min.

## 2020-03-19 NOTE — Unmapped (Signed)
Senna 2 tabs before bed.  Or   Colace in AM with 1 senna before bed.    We will have labs on Sunday.     All lab results last 24 hours:    Recent Results (from the past 24 hour(s))   Magnesium Level    Collection Time: 03/19/20 12:24 PM   Result Value Ref Range    Magnesium 2.1 1.6 - 2.6 mg/dL   Comprehensive Metabolic Panel    Collection Time: 03/19/20 12:24 PM   Result Value Ref Range    Sodium 137 135 - 145 mmol/L    Potassium 5.0 (H) 3.4 - 4.5 mmol/L    Chloride 106 98 - 107 mmol/L    Anion Gap 8 5 - 14 mmol/L    CO2 23.0 20.0 - 31.0 mmol/L    BUN 27 (H) 9 - 23 mg/dL    Creatinine 1.61 (H) 0.60 - 0.80 mg/dL    BUN/Creatinine Ratio 24     EGFR CKD-EPI Non-African American, Female 54 (L) >=60 mL/min/1.56m2    EGFR CKD-EPI African American, Female 68 >=60 mL/min/1.62m2    Glucose 165 70 - 179 mg/dL    Calcium 9.2 8.7 - 09.6 mg/dL    Albumin 4.1 3.4 - 5.0 g/dL    Total Protein 6.6 5.7 - 8.2 g/dL    Total Bilirubin 0.6 0.3 - 1.2 mg/dL    AST 38 (H) <=04 U/L    ALT 53 (H) 10 - 49 U/L    Alkaline Phosphatase 155 (H) 46 - 116 U/L   CBC w/ Differential    Collection Time: 03/19/20 12:24 PM   Result Value Ref Range    WBC 1.1 (L) 4.5 - 11.0 10*9/L    RBC 3.18 (L) 4.00 - 5.20 10*12/L    HGB 8.4 (L) 12.0 - 16.0 g/dL    HCT 54.0 (L) 98.1 - 46.0 %    MCV 79.0 (L) 80.0 - 100.0 fL    MCH 26.4 26.0 - 34.0 pg    MCHC 33.5 31.0 - 37.0 g/dL    RDW 19.1 (H) 47.8 - 15.0 %    MPV 12.9 (H) 7.0 - 10.0 fL    Platelet 14 (L) 150 - 440 10*9/L    Variable HGB Concentration Slight (A) Not Present    Neutrophils % 60.1 %    Lymphocytes % 28.4 %    Monocytes % 5.9 %    Eosinophils % 0.5 %    Basophils % 0.6 %    Absolute Neutrophils 0.6 (L) 2.0 - 7.5 10*9/L    Absolute Lymphocytes 0.3 (L) 1.5 - 5.0 10*9/L    Absolute Monocytes 0.1 (L) 0.2 - 0.8 10*9/L    Absolute Eosinophils 0.0 0.0 - 0.4 10*9/L    Absolute Basophils 0.0 0.0 - 0.1 10*9/L    Large Unstained Cells 5 (H) 0 - 4 %    Microcytosis Slight (A) Not Present    Hypochromasia Slight (A) Not Present

## 2020-03-20 LAB — TACROLIMUS LEVEL: TACROLIMUS BLOOD: 4.6 ng/mL

## 2020-03-20 NOTE — Unmapped (Signed)
Bone Marrow Transplant and Cellular Therapy Program  Immunosuppressive Therapy Note    Susan Kane is a 59 y.o. 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 Susan Kane is currently day +66 (as of 8/28).    Current dose: tacrolimus 1.5 mg PO BID    Goal tacrolimus Level: 5-10 ng/mL    Resulted level: 4.6 ng/mL (drawn late at 12:24pm on 8/27 and true trough within target range; resulted on 8/28)    Lab Results   Component Value Date/Time    TACROLIMUS 4.6 03/19/2020 12:24 PM    TACROLIMUS 4.9 (L) 03/17/2020 11:18 AM    TACROLIMUS 5.7 03/15/2020 09:38 AM    TACROLIMUS 6.0 03/12/2020 01:10 PM    TACROLIMUS 7.6 03/10/2020 07:31 AM     Lab Results   Component Value Date/Time    CREATININE 1.11 (H) 03/19/2020 12:24 PM    CREATININE 0.89 (H) 03/17/2020 11:18 AM    CREATININE 0.81 (H) 03/15/2020 09:38 AM    CREATININE 0.96 (H) 03/14/2020 09:28 AM      Assessment/Recommendation: True tacrolimus level remains therapeutic. Her SCr is uptrending. Alk phos and LFTs are mildly elevated and TBili is WNL. Given the above, plan to continue current tacrolimus dose of 1.5 mg PO BID and will recheck at next clinic visit to further assess trend and need for adjustment.     We will continue to monitor levels.  Patient will be followed for changes in renal and hepatic function, toxicity, and efficacy.     Berneta Levins, CPP  [facility]

## 2020-03-21 ENCOUNTER — Encounter: Admit: 2020-03-21 | Discharge: 2020-03-22 | Payer: PRIVATE HEALTH INSURANCE

## 2020-03-21 DIAGNOSIS — D701 Agranulocytosis secondary to cancer chemotherapy: Secondary | ICD-10-CM | POA: Diagnosis not present

## 2020-03-21 DIAGNOSIS — C9110 Chronic lymphocytic leukemia of B-cell type not having achieved remission: Secondary | ICD-10-CM | POA: Diagnosis not present

## 2020-03-21 DIAGNOSIS — D619 Aplastic anemia, unspecified: Secondary | ICD-10-CM | POA: Diagnosis not present

## 2020-03-21 DIAGNOSIS — T451X5A Adverse effect of antineoplastic and immunosuppressive drugs, initial encounter: Secondary | ICD-10-CM | POA: Diagnosis not present

## 2020-03-21 DIAGNOSIS — Z7682 Awaiting organ transplant status: Secondary | ICD-10-CM | POA: Diagnosis not present

## 2020-03-21 LAB — CBC W/ AUTO DIFF
BASOPHILS ABSOLUTE COUNT: 0 10*9/L (ref 0.0–0.1)
BASOPHILS RELATIVE PERCENT: 0.1 %
EOSINOPHILS ABSOLUTE COUNT: 0 10*9/L (ref 0.0–0.4)
EOSINOPHILS RELATIVE PERCENT: 0 %
HEMATOCRIT: 21.1 % — ABNORMAL LOW (ref 36.0–46.0)
HEMOGLOBIN: 7.2 g/dL — ABNORMAL LOW (ref 12.0–16.0)
LARGE UNSTAINED CELLS: 4 % (ref 0–4)
LYMPHOCYTES ABSOLUTE COUNT: 0.3 10*9/L — ABNORMAL LOW (ref 1.5–5.0)
LYMPHOCYTES RELATIVE PERCENT: 24.1 %
MEAN CORPUSCULAR HEMOGLOBIN CONC: 34.3 g/dL (ref 31.0–37.0)
MEAN CORPUSCULAR HEMOGLOBIN: 26.9 pg (ref 26.0–34.0)
MEAN CORPUSCULAR VOLUME: 78.3 fL — ABNORMAL LOW (ref 80.0–100.0)
MEAN PLATELET VOLUME: 9.5 fL (ref 7.0–10.0)
MONOCYTES ABSOLUTE COUNT: 0.1 10*9/L — ABNORMAL LOW (ref 0.2–0.8)
MONOCYTES RELATIVE PERCENT: 6 %
NEUTROPHILS ABSOLUTE COUNT: 0.8 10*9/L — ABNORMAL LOW (ref 2.0–7.5)
NEUTROPHILS RELATIVE PERCENT: 65.6 %
PLATELET COUNT: 7 10*9/L — CL (ref 150–440)
RED BLOOD CELL COUNT: 2.7 10*12/L — ABNORMAL LOW (ref 4.00–5.20)
RED CELL DISTRIBUTION WIDTH: 15.4 % — ABNORMAL HIGH (ref 12.0–15.0)
WBC ADJUSTED: 1.3 10*9/L — ABNORMAL LOW (ref 4.5–11.0)

## 2020-03-21 LAB — COMPREHENSIVE METABOLIC PANEL
ALBUMIN: 3.9 g/dL (ref 3.4–5.0)
ALKALINE PHOSPHATASE: 144 U/L — ABNORMAL HIGH (ref 46–116)
ALT (SGPT): 59 U/L — ABNORMAL HIGH (ref 10–49)
ANION GAP: 6 mmol/L (ref 5–14)
AST (SGOT): 27 U/L (ref <=34)
BILIRUBIN TOTAL: 0.6 mg/dL (ref 0.3–1.2)
BLOOD UREA NITROGEN: 26 mg/dL — ABNORMAL HIGH (ref 9–23)
BUN / CREAT RATIO: 23
CALCIUM: 8.6 mg/dL — ABNORMAL LOW (ref 8.7–10.4)
CHLORIDE: 108 mmol/L — ABNORMAL HIGH (ref 98–107)
CO2: 24 mmol/L (ref 20.0–31.0)
CREATININE: 1.15 mg/dL — ABNORMAL HIGH
EGFR CKD-EPI AA FEMALE: 60 mL/min/{1.73_m2} (ref >=60)
EGFR CKD-EPI NON-AA FEMALE: 52 mL/min/{1.73_m2} — ABNORMAL LOW (ref >=60)
GLUCOSE RANDOM: 160 mg/dL (ref 70–179)
POTASSIUM: 4.9 mmol/L — ABNORMAL HIGH (ref 3.4–4.5)
PROTEIN TOTAL: 6.2 g/dL (ref 5.7–8.2)
SODIUM: 138 mmol/L (ref 135–145)

## 2020-03-21 LAB — MAGNESIUM: MAGNESIUM: 2.1 mg/dL (ref 1.6–2.6)

## 2020-03-21 LAB — SLIDE REVIEW

## 2020-03-21 LAB — PLATELET COUNT: PLATELET COUNT: 29 10*9/L — ABNORMAL LOW (ref 150–440)

## 2020-03-21 MED ADMIN — heparin, porcine (PF) 100 unit/mL injection 200 Units: 200 [IU] | INTRAVENOUS | @ 19:00:00 | Stop: 2020-03-22

## 2020-03-21 MED FILL — ZOLPIDEM 5 MG TABLET: 30 days supply | Qty: 60 | Fill #0 | Status: AC

## 2020-03-21 NOTE — Unmapped (Signed)
0920: Patient present for lab check, no acute concerns reported. [name] present; labs drawn and sent for analysis. Patient comfortable with family present and no requests at this time.     1015: Hgb 7.2, Plt 7, pRBC and Plt transfusion needed. Consent noted in chart; pre-meds not needed per patient.    1120: Platelet transfusion in progress, 5 minute vitals obtained; no sign of reaction noted. Rate increased from 120 mL/hr to 600 mL/hr per protocol. Will continue to monitor.    1250: Platelet transfusion complete with no reaction noted. Unit 1 of pRBC transfusion in progress, 5 minute vitals obtained; no sign of reaction noted. Rate increased from 120 mL/hr to 450 mL/hr per protocol. Will continue to monitor.    1350: Unit 2 in progress, 5 minute vitals obtained; no sign of reaction noted. Rate increased from 120 mL/hr to 450 mL/hr per protocol. Will continue to monitor.    1445: Transfusion complete, no reaction noted.  Post-transfusion platelets 29, no additional platelets needed. Central line care performed; lumen flushed and heparin locked per protocol. Patient discharged home alert oriented and in stable condition.

## 2020-03-22 ENCOUNTER — Encounter: Admit: 2020-03-22 | Discharge: 2020-03-22 | Payer: PRIVATE HEALTH INSURANCE

## 2020-03-22 ENCOUNTER — Ambulatory Visit: Admit: 2020-03-22 | Discharge: 2020-03-22 | Payer: PRIVATE HEALTH INSURANCE

## 2020-03-22 DIAGNOSIS — D709 Neutropenia, unspecified: Secondary | ICD-10-CM | POA: Diagnosis not present

## 2020-03-22 DIAGNOSIS — D619 Aplastic anemia, unspecified: Secondary | ICD-10-CM | POA: Diagnosis not present

## 2020-03-22 DIAGNOSIS — E039 Hypothyroidism, unspecified: Secondary | ICD-10-CM | POA: Diagnosis not present

## 2020-03-22 DIAGNOSIS — Z79890 Hormone replacement therapy (postmenopausal): Secondary | ICD-10-CM | POA: Diagnosis not present

## 2020-03-22 DIAGNOSIS — I10 Essential (primary) hypertension: Secondary | ICD-10-CM | POA: Diagnosis not present

## 2020-03-22 DIAGNOSIS — Z6820 Body mass index (BMI) 20.0-20.9, adult: Secondary | ICD-10-CM | POA: Diagnosis not present

## 2020-03-22 DIAGNOSIS — B1910 Unspecified viral hepatitis B without hepatic coma: Secondary | ICD-10-CM | POA: Diagnosis not present

## 2020-03-22 DIAGNOSIS — Z9484 Stem cells transplant status: Secondary | ICD-10-CM | POA: Diagnosis not present

## 2020-03-22 DIAGNOSIS — C9110 Chronic lymphocytic leukemia of B-cell type not having achieved remission: Principal | ICD-10-CM

## 2020-03-22 LAB — CBC W/ AUTO DIFF
BASOPHILS ABSOLUTE COUNT: 0 10*9/L (ref 0.0–0.1)
BASOPHILS RELATIVE PERCENT: 0.4 %
EOSINOPHILS ABSOLUTE COUNT: 0 10*9/L (ref 0.0–0.4)
EOSINOPHILS RELATIVE PERCENT: 0 %
HEMATOCRIT: 27.6 % — ABNORMAL LOW (ref 36.0–46.0)
HEMOGLOBIN: 9.9 g/dL — ABNORMAL LOW (ref 12.0–16.0)
LARGE UNSTAINED CELLS: 4 % (ref 0–4)
LYMPHOCYTES ABSOLUTE COUNT: 0.3 10*9/L — ABNORMAL LOW (ref 1.5–5.0)
LYMPHOCYTES RELATIVE PERCENT: 24.1 %
MEAN CORPUSCULAR HEMOGLOBIN CONC: 35.8 g/dL (ref 31.0–37.0)
MEAN CORPUSCULAR HEMOGLOBIN: 29.4 pg (ref 26.0–34.0)
MEAN CORPUSCULAR VOLUME: 82.2 fL (ref 80.0–100.0)
MEAN PLATELET VOLUME: 10.9 fL — ABNORMAL HIGH (ref 7.0–10.0)
MONOCYTES ABSOLUTE COUNT: 0 10*9/L — ABNORMAL LOW (ref 0.2–0.8)
MONOCYTES RELATIVE PERCENT: 3.9 %
NEUTROPHILS ABSOLUTE COUNT: 0.7 10*9/L — ABNORMAL LOW (ref 2.0–7.5)
NEUTROPHILS RELATIVE PERCENT: 67.6 %
PLATELET COUNT: 27 10*9/L — ABNORMAL LOW (ref 150–440)
RED BLOOD CELL COUNT: 3.35 10*12/L — ABNORMAL LOW (ref 4.00–5.20)
RED CELL DISTRIBUTION WIDTH: 16 % — ABNORMAL HIGH (ref 12.0–15.0)
WBC ADJUSTED: 1 10*9/L — ABNORMAL LOW (ref 4.5–11.0)

## 2020-03-22 LAB — COMPREHENSIVE METABOLIC PANEL
ALBUMIN: 3.9 g/dL (ref 3.4–5.0)
ALKALINE PHOSPHATASE: 145 U/L — ABNORMAL HIGH (ref 46–116)
ALT (SGPT): 59 U/L — ABNORMAL HIGH (ref 10–49)
ANION GAP: 6 mmol/L (ref 5–14)
AST (SGOT): 32 U/L (ref <=34)
BILIRUBIN TOTAL: 0.5 mg/dL (ref 0.3–1.2)
BLOOD UREA NITROGEN: 23 mg/dL (ref 9–23)
BUN / CREAT RATIO: 23
CALCIUM: 8.1 mg/dL — ABNORMAL LOW (ref 8.7–10.4)
CHLORIDE: 109 mmol/L — ABNORMAL HIGH (ref 98–107)
CO2: 21 mmol/L (ref 20.0–31.0)
CREATININE: 1.01 mg/dL — ABNORMAL HIGH
EGFR CKD-EPI AA FEMALE: 70 mL/min/{1.73_m2} (ref >=60)
EGFR CKD-EPI NON-AA FEMALE: 61 mL/min/{1.73_m2} (ref >=60)
GLUCOSE RANDOM: 219 mg/dL — ABNORMAL HIGH (ref 70–179)
POTASSIUM: 4.3 mmol/L (ref 3.4–4.5)
PROTEIN TOTAL: 6.1 g/dL (ref 5.7–8.2)
SODIUM: 136 mmol/L (ref 135–145)

## 2020-03-22 LAB — MAGNESIUM: MAGNESIUM: 2 mg/dL (ref 1.6–2.6)

## 2020-03-22 MED ADMIN — heparin, porcine (PF) 100 unit/mL injection 200 Units: 200 [IU] | INTRAVENOUS | @ 15:00:00 | Stop: 2020-03-22

## 2020-03-22 MED ADMIN — tbo-filgrastim (GRANIX) injection 300 mcg: 300 ug | SUBCUTANEOUS | @ 17:00:00 | Stop: 2020-03-22

## 2020-03-22 NOTE — Unmapped (Signed)
BMT-CT Clinic Follow-up    Patient Name: Susan Kane  MRN: [id number]  Encounter date: 03/22/2020    Referring Physician: Dr. Jillyn Hidden Nida Boatman) [name]  Primary Care Provider: Thora Lance, MD  BMT Attending MD: Dr. Oswald Hillock    Disease: CLL  Current disease status: SD (stable disease)  Type of Transplant: RIC MUD Allo  Graft Source: Cryopreserved PBSCs  Transplant Day: +65     Study Participant: BMT CTN 1704: Composite Health Assessment Risk Model for Older Adults: Applying Pre-transplant Comorbidity, Geriatric Assessment and Biomarkers to Predict Non-Relapse Mortality After Allogeneic Transplant (CHARM).  ??  HPI:  Susan Kane??is a 59 y.o.??woman??with a diagnosis of CLL??and aplastic anemia who is now s/p RIC MUD AlloSCT. Her transplant course was complicated by delayed engraftment. ??She was initially discharged from the hospital on 02/23/2020. However was re-admitted on 02/27/20 with a fever and need for workup and IV cefepime. Infectious workup was unrevealing, IV cefepime was stopped on 03/02/20, and she was once again discharged on 03/03/20.      Interval History:   [name] is here for routine follow up after transplant. Has no nausea.  Takes zofran 2 x per day before meds. Eating more.  Had  Pork tenderloin, ratatouille, and rice. Had a full portion. This  protein bar and chai latte for breakfast.  Egg mc muffin. Tomato sandwhich and chips.  Still having constipation this weekend.  Took senna and stool softener and mirlax and had large loose movement. Now cutting back on laxatives to find out how much she needs to take to keep regular.  Skin is good.   She is here today with her friend who is caregiver.      Oncology History Overview Note   CLL:    Summary as per Dr. Kalman Drape most recent note with minor edits/additions from my review of records    1.??CLL?????diagnosed in August 2010, flow cytometry consistent with CLL  ?? Enlarged left??inguinal lymph node January 2019,??small??neck/axillary nodes and palpable splenomegaly 09/11/2017  ?? CTs??on 09/17/2017-3 cm necrotic appearing lymph node in the left inguinal region, borderline enlarged pelvic/retroperitoneal, chest, and axillary nodes. ??Mild splenomegaly.  ?? Ultrasound-guided biopsy of the left inguinal lymph node 09/18/2017, slightly purulent fluid aspirated, core biopsy is consistent with an atypical lymphoid proliferation???extensive necrosis with surrounding epithelioid histiocytes, limited intact lymphoid tissue involved with CLL  ?? Incisional biopsy of a necrotic/purulent left inguinal lymph node on 10/01/2017???extensive necrosis with granulomatous inflammation, small amount of viable lymphoid tissue involved with CLL, AFB and fungal stains negative  ?? Peripheral blood FISH analysis 02/05/2018??? +deletion 13q14, no evidence of p53 (17p13) deletion, no evidence of 11q22??deletion  - normal karyotype (46,XX)  ?? Bone marrow biopsy 02/26/2018???hypercellular marrow with extensive involvement by CLL, lymphocytes represent??85% of all cells  ?? Ibrutinib initiated 04/03/2018  ?? Ibrutinib placed on hold 04/11/2018 due to onset of arthralgias  ?? Ibrutinib resumed 04/16/2018, discontinued 04/25/2018 secondary to severe arthralgias/arthritis  ?? Ibrutinib resumed at a dose of 140 mg daily 05/03/2018  ?? Ibrutinib dose adjusted to 140 mg alternating with 280 mg 06/25/2018  ?? 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 ??Augmentin prescribed.  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    Baseline [facility] bx reviewed  at [facility]  Final Diagnosis   (Outside Case #:  [id number], dated 02/26/2018)  Bone marrow, aspiration and biopsy  -  Hypercellular bone marrow (80%) with extensive involvement by chronic lymphocytic leukemia (87% lymphocytes by manual aspirate differential)  (See Comment)  -  By outside report, cytogenetic results are normal     Karyotype: 41, XX and FISH with 13q del but no 11q or 17p del    Repeat BM bx: (done after being off ibrutinib x1 month)  80% involvement by CLL    08/13/18: Presents to [facility] to discuss plan of care     Chronic lymphocytic leukemia (CLL), B-cell (CMS-HCC)   07/12/2018 Initial Diagnosis    Chronic lymphocytic leukemia (CLL), B-cell (CMS-HCC)     08/26/2018 -  Chemotherapy    Acalabrutinib started (approximate date)     11/14/2018 -  Chemotherapy    Acalabrutinib discontinued due to progressive bone marrow failure     12/06/2018 -  Chemotherapy    Ritux x1 given due to CLL still making up majority of BM cellularity (though in the setting of near aplasia of rest of TLH)     03/19/2019 -  Chemotherapy    Equine ATG / CsA for aplastic anemia (suspect 2/t acalabrutinib)     Chronic lymphocytic leukemia of B-cell type not having achieved remission (CMS-HCC)   01/21/2019 Initial Diagnosis    Chronic lymphocytic leukemia of B-cell type not having achieved remission (CMS-HCC)     01/01/2020 -  Chemotherapy    BMT IP BENDAMUSTINE / FLUDARABINE / RITUXIMAB (OP RITUXIMAB Days -13 & -6) (MUD)  bendamustine 130 mg/m2 IV on days -5, -4, -3   fludarabine 30 mg/m2 IV on days -5, -4, -3   riTUXimab 375 mg/m2 IV on day -13 as a OP treatment day, then 1,000 mg/m2 on days -6 as a OP treatment day, +1, +8   tacrolimus, 0.045 mg/kg PO BID on days -3 and -2 then  0.03 mg/kg PO BID starting on day -1   methotrexate 5 mg/m2 IV on days +1, +3, +6, +11   tbo-filgrastim 300 mcg (<= 75 kg) / 480 mcg (> 75 kg) q24h starting on day +7        Patient Active Problem List   Diagnosis   ??? Chronic lymphocytic leukemia (CLL), B-cell (CMS-HCC)   ??? Hypogammaglobulinemia (CMS-HCC)   ??? Chronic lymphocytic leukemia of B-cell type not having achieved remission (CMS-HCC)   ??? Aplastic anemia (CMS-HCC)   ??? 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 Drug-induced nausea and vomiting   ??? Stem cell transplant candidate   ??? Chemotherapy induced neutropenia (CMS-HCC)   ??? History of allogeneic stem cell transplant (CMS-HCC)   ??? Osteopenia   ??? Neutropenia, drug-induced (CMS-HCC)   ??? Immunosuppressed status (CMS-HCC)     Review of Systems:  A full system review was performed and was negative except as noted in the above interval history.    Reviewed and updated past medical, surgical, social, and family history as appropriate.      No Known Allergies  Current Outpatient Medications   Medication Sig Dispense Refill   ??? calcium carbonate-vitamin D3 600 mg(1,500mg ) -800 unit Tab Take 1 tablet by mouth Two (2) times a day. (Patient not taking: Reported on 03/05/2020) 60 tablet 5   ??? eltrombopag (PROMACTA) 50 MG tablet Take 2 tablets (100 mg total) by mouth daily. Administer on an empty stomach, 1 hour before or 2 hours after a meal. 60 tablet  2   ??? famotidine (PEPCID) 20 MG tablet Take 1 tablet (20 mg total) by mouth two (2) times a day as needed for heartburn. 60 tablet 0   ??? folic acid (FOLVITE) 1 MG tablet Take 1 tablet (1 mg total) by mouth at bedtime. 30 tablet 5   ??? levothyroxine (SYNTHROID) 100 MCG tablet Take 1 tablet (100 mcg total) by mouth daily. 30 tablet 5   ??? lithium (LITHOBID) 300 MG ER tablet Take 1 tablet (300 mg total) by mouth at bedtime. 30 tablet 5   ??? magnesium oxide-Mg AA chelate (MAGNESIUM, AMINO ACID CHELATE,) 133 mg Tab Take 1 tablet by mouth two (2) times a day. 100 tablet 2   ??? nystatin (MYCOSTATIN) 100,000 unit/mL suspension Take 5 mL (500,000 Units total) by mouth Four (4) times a day. 60 mL 0   ??? ondansetron (ZOFRAN) 8 MG tablet Take 1 tablet (8 mg total) by mouth every twelve (12) hours as needed for nausea. 30 tablet 2   ??? polyethylene glycol (MIRALAX) 17 gram packet Take 17 g by mouth daily as needed (constipation). ??? posaconazole (NOXAFIL) 100 mg TbEC delayed released tablet Take 3 tablets (300 mg total) by mouth daily. 90 tablet 1   ??? senna-docusate (SENNOSIDES-DOCUSATE SODIUM) 8.6-50 mg Take 1 tablet by mouth at bedtime.     ??? tacrolimus (PROGRAF) 0.5 MG capsule Take 3 capsules (1.5 mg total) by mouth two (2) times a day. 180 capsule 5   ??? TRINTELLIX 10 mg tablet Take 1 tablet (10 mg total) by mouth at bedtime. 30 tablet 5   ??? valGANciclovir (VALCYTE) 450 mg tablet Take 1 tablet (450 mg total) by mouth two (2) times a day. 120 tablet 0   ??? zolpidem (AMBIEN) 5 MG tablet Take 1 to 2 tablets (5 mg-10 mg) as needed for insomnia. 60 tablet 0     Current Facility-Administered Medications   Medication Dose Route Frequency Provider Last Rate Last Admin   ??? tbo-filgrastim (GRANIX) injection 300 mcg  300 mcg Subcutaneous Once [facility], ANP         Objective:  Vitals:    03/22/20 1203   BP: 120/60   Pulse: 81   Resp: 18   Temp: 36.9 ??C (98.5 ??F)   TempSrc: Temporal   SpO2: 98%   Weight: 55 kg (121 lb 3.2 oz)   Height: 165.1 cm (5' 5)     Physical Exam:  General appearance - Thin WF,  appears well, no distress noted  Mental status - affect appropriate to mood  Eyes - conjunctivae clear, sclera anicteric   ENT - mucous membranes moist, no ulcerations noted or erythema, normal non-tender sinuses. Tongue is coated  Neck - supple, no palpable LAD  Chest - lungs are clear to auscultation bilaterally without wheezes/rales/rhonchi   Heart - regular rate and rhythm without murmur, rub, or gallop   Abdomen - soft, bowel sounds within normal limits, no masses, no tenderness, no hepatosplenomegaly   Neurological - motor and sensory grossly normal bilaterally  Musculoskeletal - no joint tenderness, deformity or swelling, no muscular tenderness noted  Extremities - no pedal edema noted  Skin - no skin breakdown noted, no rash dry  Venous access - line site is non-tender, no erythema/drainage    Karnofsky/Lansky Performance Status  80, activity with effort; some signs or symptoms of disease (ECOG equivalent 1)    Test Results:   Reviewed in EPIC. Abnormal values discussed below.     Assessment/Plan:  Mrs. Mcroy is 59 yo woman with CLL as well as AA/bone marrow failure.  ??  CLL: CT C/A/P on 10/10/19 revealed no evidence of LAD or organomegaly. Possible endometrial fluid or thickening in the fundus of the uterus.  10/07/19 BMBx: Scant bone marrow sampling (<5% cellular) essentially devoid of hematopoietic elements.  - ??Persistent CLL representing 23% of marrow cellularity by flow cytometric analysis.  -  Corresponding CBC: WBC 1.0, ANC 0.1.  ????????  ??  12/26/19: BmBx:   - ??Hypocellular (20% overall) involved by chronic lymphocytic leukemia/small lymphocytic lymphoma, representing approximately 20% of marrow cells by PAX5 immunohistochemistry.  - Normal karyotype: 46,XX[20]    02/10/20: BmBx:  -Markedly hypocellular bone marrow (< 5%) with essentially absent hematopoiesis. Normal Recipient and Donor karyotype:  46,XX[2]//46,XY[7].       BMT:??SD (stable disease)  HCT-CI (age adjusted) 5??(active CLL, psych, age adjusted)    Conditioning:  1. Rituximab 375 mg/m2 on day -13 and 1000 mg/m2 on D-6, +1, and +8  2. Bendamustine 130 mg/m2 IV daily over 10 minutes on D-5 through -3  3. Fludarabine 30 mg/m2 IV daily over 30 minutes on D-5 through -3  ??  Donor: 10/10, ABO identical; O - , CMV negative  Cell dose: 5.51x10^6 cells/kg    Engraftment:   - Granix D+12 through WBC recovery (ANC 1.0 x 2 days or 3.0 x 1 day)  - Viral studies showed weakly positive EBV, CMV, HHV-6, plan to monitor for now  - D+30 BMBx: markedly hypocellular marrow (<5%) with essentially absent hematopoiesis.   Chimerisms:     Ref. Range 02/09/2020 00:23 02/10/2020 14:45 03/08/2020 13:49 03/08/2020 13:49   Specimen Type Unknown Blood Bone Marrow Blood Blood   T-Cell (CD3) Recipient: Latest Units: %    >95   T-Cell (CD3) Donor: Latest Units: %    <5   Unfract. Recipient: Latest Units: % 37 17 14 Units: % 37 17 14  15    Unfract. Donor: Latest Units: % 63 83 86  85       GvHD prophylaxis:   1. Tacrolimus goal 5-10, dose adjustments per BMT pharmacy.   ??** Current dose 1.5mg  BID  2. Completed Methotrexate 5 mg/m2 on D+1, +3, +6 and +11  3. rATG 1 mg/kg on D-2 and -1 included  ??  Heme:  Transfusion criteria: ABO identical donor [O-]   - Standard txf criteria: 1 unit of PRBCs for Hgb<7 and 1 unit of platelets for Plt <10K or bleeding.     ** No history of transfusion reactions.   - Granix for WBC of 1 or less [given 8/9, 03/03/20]   - Udenyca 6mg  x1 given 03/05/20  - Has been requiring PRBCs and platelets [last transfused 03/14/20].   03/15/20: Remains with cytopenias [CMV vs medication effect]. WBC/ANC improved slightly today. D+60 DNA studies sent today.   8/27: DNA still pending. We did not get a lasting response to udenyca 2 weeks ago. She has been neutropenic for the last week.   We did not layer granix on top of the undenyca. Now that 2 weeks has passed we will start granix. 300 mcg given today.  Plan for blood products in infusion on Sunday.   8/30:  She will not get any products today. Granix given today 300 mcg.    ID:????  Prophylaxis:  - Antiviral: Valcyte as below for CMV viremia.   - Antifungal: Posaconazole 300 mg daily  - Antibacterial:??If ANC remains <0.5 on next lab check, will start Cefdinir.    -  PJP: S/p pentamidine on 02/21/20, can replace with Bactrim once platelets engraft. For now will continue with monthly Pentamidine next on 03/24/20.    CMV:  02/24/20: CMV 207  03/02/20: CMV 884. Valganciclovir started.    03/08/20: CMV decreased after 1 week of therapy to 140.  03/15/20: Repeat pending, if <50 can go to maintenance dosing.  03/17/20: Last CMV <50. Decreased valcyte to 450 mg BID.  ** Letermovir denied. Appeal in progress.    8/27: Maintenance dosing ends on 9/8  ??  HHV-6:   - Peaked on PCR at 41.2, now negative.   ??  EBV: negative,  - Weekly, pending     Neutropenic fever  03/02/20 Fever at home Fever at home 100.4 and low grade in ER of 99.5. ??  - Cx NGTD  - IV cefepime (8/8-8/10)  - Now afebrile.   ??  GI:??  GERD Prophylaxis: Pepcid.  This is now PRN.   ??  CINV:  - Anti-emetics per BMT protocol.   - Zofran 8mg  prn [taking bid]  - Nightly zyprexa.     Coated Tongue:   - Nystatin solution PRN.   ??  Renal:   - No active issues.   ??  FEN:   - Continue folic acid.    Hypomagnesemia:  - Mag Chelate 1 tablet BID.   ??  Endocrine:  Hypothyroid:??  - Continue Synthroid daily.   - TSH, T3, T4 - normal   ??  Hepatic:??  - VOD prophylaxis not warranted as non-myeloablative regimen.   ??  Hepatocellular liver injury pattern:  - Mild increase in AST, ALT, and AlkPhos, likely secondary to posaconazole. Stable today.   ??  CV:  HTN: Now normotensive off treatment.   ??  Pulm:  AHRF: First noted on 7/22. Intermittent O2 requirement with exam findings concerning for LLL crackles. No signs of volume overload on exam. Chest-X ray clear. Now on RA.     Neuro/Pain:??  - No c/o at this time    Psych:??Has a home psychiatrist.   Depression/Anxiety:   - Per outpt note pt was taking Xanax 0.25 mg BID PRN for anxiety but this was not on her home med list thus I did not re-order at this time. Discussed with patient who stated she rarely takes.  - Lithium 300mg  nightly   03/15/20: Checking with home prescriber to see if they want [place] to check a level; [last 12/2019]   - Trintellix 10mg  nightly  ??  Insomnia:  - Ambien 10mg  nightly PRN  ??  Caregiver/Lodging:  - Husband [name] will be primary caregiver  - Staying at local rental.     Dimple Nanas, ANP  Bone Marrow Transplant and Cellular Therapy Program    I personally spent 45  minutes face-to-face and non-face-to-face in the care of this patient, which includes all pre, intra, and post visit time on the date of service. [facility]   03/24/2020  1:30 PM ONCINF CHAIR 02 HONC3UCA [facility]   03/31/2020 10:15 AM ONCBMT LABS HONCBMT [facility]   03/31/2020 10:45 AM Artelia Laroche, MD HONCBMT [facility]   04/02/2020 10:45 AM ONCBMT LABS HONCBMT [facility]   04/02/2020 11:15 AM ONCBMT APP B HONCBMT [facility]   04/05/2020  9:45 AM ONCBMT LABS HONCBMT [facility]   04/05/2020 10:15 AM ONCBMT APP B HONCBMT [facility]   04/07/2020 10:45 AM ONCBMT LABS HONCBMT [facility]   04/07/2020 11:15 AM ONCBMT APP B HONCBMT [facility]   04/09/2020  9:45 AM ONCBMT LABS HONCBMT [facility]   04/09/2020 10:15 AM ONCBMT APP B HONCBMT [facility]   04/12/2020 10:30 AM ONCBMT LABS HONCBMT [facility]   04/12/2020 11:00 AM ONCBMT APP A HONCBMT [facility]   04/14/2020 10:30 AM ONCBMT LABS HONCBMT [facility]   04/14/2020 11:00 AM ONCBMT APP A HONCBMT [facility]   04/16/2020 10:30 AM ONCBMT LABS HONCBMT [facility]   04/16/2020 11:00 AM ONCBMT APP A HONCBMT [facility]   04/21/2020 10:45 AM ONCBMT LABS HONCBMT [facility]   04/21/2020 11:15 AM ONCBMT APP B HONCBMT [facility]   04/21/2020 12:30 PM ONCBMT PROCEDURES HONC3UCA [facility]   04/27/2020 10:15 AM ONCBMT LABS HONCBMT [facility]   04/27/2020 10:45 AM Artelia Laroche, MD HONCBMT [facility]   06/08/2020 11:00 AM ONCINF CHAIR 04 HONC3UCA [facility]

## 2020-03-22 NOTE — Unmapped (Signed)
All lab results last 24 hours:    Recent Results (from the past 24 hour(s))   Prepare RBC    Collection Time: 03/21/20  1:34 PM   Result Value Ref Range    Crossmatch Compatible     Unit Blood Type O Neg     ISBT Number 9500     Unit # [id number]     Status Issued     Spec Expiration [id number]     Product ID Red Blood Cells     PRODUCT CODE [id number]     Crossmatch Compatible     Unit Blood Type O Neg     ISBT Number 9500     Unit # [id number]     Status Issued     Spec Expiration [id number]     Product ID Red Blood Cells     PRODUCT CODE [id number]    Comprehensive Metabolic Panel    Collection Time: 03/22/20 11:23 AM   Result Value Ref Range    Sodium 136 135 - 145 mmol/L    Potassium 4.3 3.4 - 4.5 mmol/L    Chloride 109 (H) 98 - 107 mmol/L    Anion Gap 6 5 - 14 mmol/L    CO2 21.0 20.0 - 31.0 mmol/L    BUN 23 9 - 23 mg/dL    Creatinine 5.36 (H) 0.60 - 0.80 mg/dL    BUN/Creatinine Ratio 23     EGFR CKD-EPI Non-African American, Female 61 >=60 mL/min/1.76m2    EGFR CKD-EPI African American, Female 70 >=60 mL/min/1.74m2    Glucose 219 (H) 70 - 179 mg/dL    Calcium 8.1 (L) 8.7 - 10.4 mg/dL    Albumin 3.9 3.4 - 5.0 g/dL    Total Protein 6.1 5.7 - 8.2 g/dL    Total Bilirubin 0.5 0.3 - 1.2 mg/dL    AST 32 <=64 U/L    ALT 59 (H) 10 - 49 U/L    Alkaline Phosphatase 145 (H) 46 - 116 U/L   Magnesium Level    Collection Time: 03/22/20 11:23 AM   Result Value Ref Range    Magnesium 2.0 1.6 - 2.6 mg/dL   CBC w/ Differential    Collection Time: 03/22/20 11:23 AM   Result Value Ref Range    WBC 1.0 (L) 4.5 - 11.0 10*9/L    RBC 3.35 (L) 4.00 - 5.20 10*12/L    HGB 9.9 (L) 12.0 - 16.0 g/dL    HCT 40.3 (L) 47.4 - 46.0 %    MCV 82.2 80.0 - 100.0 fL    MCH 29.4 26.0 - 34.0 pg    MCHC 35.8 31.0 - 37.0 g/dL    RDW 25.9 (H) 56.3 - 15.0 %    MPV 10.9 (H) 7.0 - 10.0 fL    Platelet 27 (L) 150 - 440 10*9/L    Variable HGB Concentration Slight (A) Not Present    Neutrophils % 67.6 %    Lymphocytes % 24.1 %    Monocytes % 3.9 % Eosinophils % 0.0 %    Basophils % 0.4 %    Absolute Neutrophils 0.7 (L) 2.0 - 7.5 10*9/L    Absolute Lymphocytes 0.3 (L) 1.5 - 5.0 10*9/L    Absolute Monocytes 0.0 (L) 0.2 - 0.8 10*9/L    Absolute Eosinophils 0.0 0.0 - 0.4 10*9/L    Absolute Basophils 0.0 0.0 - 0.1 10*9/L    Large Unstained Cells 4 0 - 4 %    Microcytosis Slight (A) Not Present  Anisocytosis Slight (A) Not Present    Hypochromasia Slight (A) Not Present

## 2020-03-22 NOTE — Unmapped (Unsigned)
Bone Marrow Transplant and Cellular Therapy Program  Immunosuppressive Therapy Note    Susan Kane is a 59 y.o. 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 Susan Kane is currently day +68.    Current dose: tacrolimus 1.5 mg PO BID    Goal tacrolimus Level: 5-10 ng/mL    Resulted level: *** ng/mL     Lab Results   Component Value Date/Time    TACROLIMUS 4.6 03/19/2020 12:24 PM    TACROLIMUS 4.9 (L) 03/17/2020 11:18 AM    TACROLIMUS 5.7 03/15/2020 09:38 AM    TACROLIMUS 6.0 03/12/2020 01:10 PM    TACROLIMUS 7.6 03/10/2020 07:31 AM     Lab Results   Component Value Date/Time    CREATININE 1.01 (H) 03/22/2020 11:23 AM    CREATININE 1.15 (H) 03/21/2020 09:20 AM    CREATININE 1.11 (H) 03/19/2020 12:24 PM    CREATININE 0.89 (H) 03/17/2020 11:18 AM      Assessment/Recommendation: True tacrolimus level remains therapeutic. Her SCr is trending back down. Alk phos and ALT are mildly elevated. AST and TBili are WNL. Given the above, plan to continue current tacrolimus dose of 1.5 mg PO BID and will recheck at next clinic visit to further assess trend and need for adjustment.     We will continue to monitor levels.  Patient will be followed for changes in renal and hepatic function, toxicity, and efficacy.     Rulon Abide, PharmD CPP  [facility] Bone Marrow Transplant and Cellular Therapy Program

## 2020-03-22 NOTE — Unmapped (Signed)
Patient received Granix subcutaneous in Injection site: Left lower quad. abdomen. Patient tolerated injection without complications and ambulated to checkout without any assistance.

## 2020-03-23 LAB — CMV QUANT LOG10: Lab: 0

## 2020-03-23 LAB — CMV DNA, QUANTITATIVE, PCR

## 2020-03-23 LAB — TACROLIMUS BLOOD: Lab: 6.8

## 2020-03-24 ENCOUNTER — Encounter: Admit: 2020-03-24 | Discharge: 2020-03-24 | Payer: PRIVATE HEALTH INSURANCE

## 2020-03-24 ENCOUNTER — Telehealth: Payer: Self-pay | Admitting: *Deleted

## 2020-03-24 DIAGNOSIS — D61810 Antineoplastic chemotherapy induced pancytopenia: Secondary | ICD-10-CM | POA: Diagnosis not present

## 2020-03-24 DIAGNOSIS — I10 Essential (primary) hypertension: Secondary | ICD-10-CM | POA: Diagnosis not present

## 2020-03-24 DIAGNOSIS — D801 Nonfamilial hypogammaglobulinemia: Secondary | ICD-10-CM | POA: Diagnosis not present

## 2020-03-24 DIAGNOSIS — Z9484 Stem cells transplant status: Secondary | ICD-10-CM | POA: Diagnosis not present

## 2020-03-24 DIAGNOSIS — Z9481 Bone marrow transplant status: Secondary | ICD-10-CM | POA: Diagnosis not present

## 2020-03-24 DIAGNOSIS — E039 Hypothyroidism, unspecified: Secondary | ICD-10-CM | POA: Diagnosis not present

## 2020-03-24 DIAGNOSIS — D709 Neutropenia, unspecified: Secondary | ICD-10-CM | POA: Diagnosis not present

## 2020-03-24 DIAGNOSIS — C9110 Chronic lymphocytic leukemia of B-cell type not having achieved remission: Secondary | ICD-10-CM | POA: Diagnosis not present

## 2020-03-24 DIAGNOSIS — D849 Immunodeficiency, unspecified: Secondary | ICD-10-CM | POA: Diagnosis not present

## 2020-03-24 LAB — CBC W/ AUTO DIFF
EOSINOPHILS ABSOLUTE COUNT: 0 10*9/L (ref 0.0–0.4)
EOSINOPHILS RELATIVE PERCENT: 0 %
HEMATOCRIT: 28.6 % — ABNORMAL LOW (ref 36.0–46.0)
LARGE UNSTAINED CELLS: 4 % (ref 0–4)
LYMPHOCYTES ABSOLUTE COUNT: 0.2 10*9/L — ABNORMAL LOW (ref 1.5–5.0)
LYMPHOCYTES RELATIVE PERCENT: 24.5 %
MEAN CORPUSCULAR HEMOGLOBIN CONC: 35.7 g/dL (ref 31.0–37.0)
MEAN CORPUSCULAR HEMOGLOBIN: 29.2 pg (ref 26.0–34.0)
MEAN CORPUSCULAR VOLUME: 81.9 fL (ref 80.0–100.0)
MEAN PLATELET VOLUME: 9.8 fL (ref 7.0–10.0)
MONOCYTES ABSOLUTE COUNT: 0.1 10*9/L — ABNORMAL LOW (ref 0.2–0.8)
MONOCYTES RELATIVE PERCENT: 8.9 %
NEUTROPHILS ABSOLUTE COUNT: 0.6 10*9/L — ABNORMAL LOW (ref 2.0–7.5)
NEUTROPHILS RELATIVE PERCENT: 62.6 %
PLATELET COUNT: 15 10*9/L — ABNORMAL LOW (ref 150–440)
RED BLOOD CELL COUNT: 3.49 10*12/L — ABNORMAL LOW (ref 4.00–5.20)
RED CELL DISTRIBUTION WIDTH: 16.1 % — ABNORMAL HIGH (ref 12.0–15.0)
WBC ADJUSTED: 0.9 10*9/L — ABNORMAL LOW (ref 4.5–11.0)

## 2020-03-24 LAB — COMPREHENSIVE METABOLIC PANEL
ALBUMIN: 4 g/dL (ref 3.4–5.0)
ALKALINE PHOSPHATASE: 149 U/L — ABNORMAL HIGH (ref 46–116)
ALT (SGPT): 56 U/L — ABNORMAL HIGH (ref 10–49)
ANION GAP: 5 mmol/L (ref 5–14)
AST (SGOT): 24 U/L (ref <=34)
BILIRUBIN TOTAL: 0.6 mg/dL (ref 0.3–1.2)
BLOOD UREA NITROGEN: 26 mg/dL — ABNORMAL HIGH (ref 9–23)
BUN / CREAT RATIO: 26
CHLORIDE: 111 mmol/L — ABNORMAL HIGH (ref 98–107)
CO2: 22 mmol/L (ref 20.0–31.0)
CREATININE: 1.01 mg/dL — ABNORMAL HIGH
EGFR CKD-EPI AA FEMALE: 70 mL/min/{1.73_m2} (ref >=60)
EGFR CKD-EPI NON-AA FEMALE: 61 mL/min/{1.73_m2} (ref >=60)
GLUCOSE RANDOM: 133 mg/dL (ref 70–179)
POTASSIUM: 4.6 mmol/L — ABNORMAL HIGH (ref 3.4–4.5)
PROTEIN TOTAL: 6.2 g/dL (ref 5.7–8.2)
SODIUM: 138 mmol/L (ref 135–145)

## 2020-03-24 LAB — LACTATE DEHYDROGENASE: Lactate dehydrogenase:CCnc:Pt:Ser/Plas:Qn:Reaction: pyruvate to lactate: 173

## 2020-03-24 LAB — TACROLIMUS, TROUGH: Lab: 4.5 — ABNORMAL LOW

## 2020-03-24 LAB — BLOOD UREA NITROGEN: Urea nitrogen:MCnc:Pt:Ser/Plas:Qn:: 26 — ABNORMAL HIGH

## 2020-03-24 LAB — MAGNESIUM: Magnesium:MCnc:Pt:Ser/Plas:Qn:: 2.2

## 2020-03-24 LAB — MONOCYTES RELATIVE PERCENT: Monocytes/100 leukocytes:NFr:Pt:Bld:Qn:Automated count: 8.9

## 2020-03-24 LAB — TOXIC GRANULATION

## 2020-03-24 MED ADMIN — heparin, porcine (PF) 100 unit/mL injection 200 Units: 200 [IU] | INTRAVENOUS | @ 16:00:00 | Stop: 2020-03-24

## 2020-03-24 MED FILL — POSACONAZOLE 100 MG TABLET,DELAYED RELEASE: ORAL | 30 days supply | Qty: 90 | Fill #1

## 2020-03-24 MED FILL — POSACONAZOLE 100 MG TABLET,DELAYED RELEASE: 30 days supply | Qty: 90 | Fill #1 | Status: AC

## 2020-03-24 NOTE — Unmapped (Signed)
BMT-CT Clinic Follow-up    Patient Name: Susan Kane  MRN: [id number]  Encounter date: 03/24/2020    Referring Physician: Dr. Jillyn Hidden Nida Boatman) Truett Kane  Primary Care Provider: Thora Lance, MD  BMT Attending MD: Dr. Oswald Hillock    Disease: CLL  Current disease status: SD (stable disease)  Type of Transplant: RIC MUD Allo  Graft Source: Cryopreserved PBSCs  Transplant Day: +70    Study Participant: BMT CTN 1704: Composite Health Assessment Risk Model for Older Adults: Applying Pre-transplant Comorbidity, Geriatric Assessment and Biomarkers to Predict Non-Relapse Mortality After Allogeneic Transplant (CHARM).  ??  HPI:  Susan Kane??is a 59 y.o.??woman??with a diagnosis of CLL??and aplastic anemia who is now s/p RIC MUD AlloSCT. Her transplant course was complicated by delayed engraftment. ??She was initially discharged from the hospital on 02/23/2020. However was re-admitted on 02/27/20 with a fever and need for workup and IV cefepime. Infectious workup was unrevealing, IV cefepime was stopped on 03/02/20, and she was once again discharged on 03/03/20.      Interval History:   Susan Kane is here for routine follow up after transplant. She continues to recover. Susan Kane is still poor: she pends at least of the time during the day resting.   She is eating better though - this has improved over the past 10 days. Denies nausea.  Takes zofran 2 x per day before meds.   Denies diarrhea. C/O constipation. Takes Colace 2 tabs in AM and Senna 2 tabs in PM. Now has diarrhea. Was advised to take only 1 tab of Senna/day.     She is here today with her husband who is caregiver.    Oncology History Overview Note   CLL:    Summary as per Dr. Kalman Drape most recent note with minor edits/additions from my review of records    1.??CLL?????diagnosed in August 2010, flow cytometry consistent with CLL  ?? Enlarged left??inguinal lymph node January 2019,??small??neck/axillary nodes and palpable splenomegaly 09/11/2017  ?? CTs??on 09/17/2017-3 cm necrotic appearing lymph node in the left inguinal region, borderline enlarged pelvic/retroperitoneal, chest, and axillary nodes. ??Mild splenomegaly.  ?? Ultrasound-guided biopsy of the left inguinal lymph node 09/18/2017, slightly purulent fluid aspirated, core biopsy is consistent with an atypical lymphoid proliferation???extensive necrosis with surrounding epithelioid histiocytes, limited intact lymphoid tissue involved with CLL  ?? Incisional biopsy of a necrotic/purulent left inguinal lymph node on 10/01/2017???extensive necrosis with granulomatous inflammation, small amount of viable lymphoid tissue involved with CLL, AFB and fungal stains negative  ?? Peripheral blood FISH analysis 02/05/2018??? +deletion 13q14, no evidence of p53 (17p13) deletion, no evidence of 11q22??deletion  - normal karyotype (46,XX)  ?? Bone marrow biopsy 02/26/2018???hypercellular marrow with extensive involvement by CLL, lymphocytes represent??85% of all cells  ?? Ibrutinib initiated 04/03/2018  ?? Ibrutinib placed on hold 04/11/2018 due to onset of arthralgias  ?? Ibrutinib resumed 04/16/2018, discontinued 04/25/2018 secondary to severe arthralgias/arthritis  ?? Ibrutinib resumed at a dose of 140 mg daily 05/03/2018  ?? Ibrutinib dose adjusted to 140 mg alternating with 280 mg 06/25/2018  ?? 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 ??Augmentin prescribed.  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    Baseline BM bx reviewed at [facility]  Final Diagnosis   (Outside  Case #:  [id number], dated 02/26/2018)  Bone marrow, aspiration and biopsy  -  Hypercellular bone marrow (80%) with extensive involvement by chronic lymphocytic leukemia (87% lymphocytes by manual aspirate differential)  (See Comment)  -  By outside report, cytogenetic results are normal     Karyotype: 53, XX and FISH with 13q del but no 11q or 17p del    Repeat BM bx: (done after being off ibrutinib x1 month)  80% involvement by CLL    08/13/18: Presents to [facility] to discuss plan of care     Chronic lymphocytic leukemia (CLL), B-cell (CMS-HCC)   07/12/2018 Initial Diagnosis    Chronic lymphocytic leukemia (CLL), B-cell (CMS-HCC)     08/26/2018 -  Chemotherapy    Acalabrutinib started (approximate date)     11/14/2018 -  Chemotherapy    Acalabrutinib discontinued due to progressive bone marrow failure     12/06/2018 -  Chemotherapy    Ritux x1 given due to CLL still making up majority of BM cellularity (though in the setting of near aplasia of rest of TLH)     03/19/2019 -  Chemotherapy    Equine ATG / CsA for aplastic anemia (suspect 2/t acalabrutinib)     Chronic lymphocytic leukemia of B-cell type not having achieved remission (CMS-HCC)   01/21/2019 Initial Diagnosis    Chronic lymphocytic leukemia of B-cell type not having achieved remission (CMS-HCC)     01/01/2020 -  Chemotherapy    BMT IP BENDAMUSTINE / FLUDARABINE / RITUXIMAB (OP RITUXIMAB Days -13 & -6) (MUD)  bendamustine 130 mg/m2 IV on days -5, -4, -3   fludarabine 30 mg/m2 IV on days -5, -4, -3   riTUXimab 375 mg/m2 IV on day -13 as a OP treatment day, then 1,000 mg/m2 on days -6 as a OP treatment day, +1, +8   tacrolimus, 0.045 mg/kg PO BID on days -3 and -2 then  0.03 mg/kg PO BID starting on day -1   methotrexate 5 mg/m2 IV on days +1, +3, +6, +11   tbo-filgrastim 300 mcg (<= 75 kg) / 480 mcg (> 75 kg) q24h starting on day +7        Patient Active Problem List   Diagnosis   ??? Chronic lymphocytic leukemia (CLL), B-cell (CMS-HCC)   ??? Hypogammaglobulinemia (CMS-HCC)   ??? Chronic lymphocytic leukemia of B-cell type not having achieved remission (CMS-HCC)   ??? Aplastic anemia (CMS-HCC)   ??? 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 Drug-induced nausea and vomiting   ??? Stem cell transplant candidate   ??? Chemotherapy induced neutropenia (CMS-HCC)   ??? History of allogeneic stem cell transplant (CMS-HCC)   ??? Osteopenia   ??? Neutropenia, drug-induced (CMS-HCC)   ??? Immunosuppressed status (CMS-HCC)     Review of Systems:  A full system review was performed and was negative except as noted in the above interval history.    Reviewed and updated past medical, surgical, social, and family history as appropriate.      No Known Allergies  Current Outpatient Medications   Medication Sig Dispense Refill   ??? calcium carbonate-vitamin D3 600 mg(1,500mg ) -800 unit Tab Take 1 tablet by mouth Two (2) times a day. (Patient not taking: Reported on 03/05/2020) 60 tablet 5   ??? eltrombopag (PROMACTA) 50 MG tablet Take 2 tablets (100 mg total) by mouth daily. Administer on an empty stomach, 1 hour before or 2 hours after a meal. 60 tablet 2   ??? famotidine (PEPCID) 20 MG  tablet Take 1 tablet (20 mg total) by mouth two (2) times a day as needed for heartburn. 60 tablet 0   ??? folic acid (FOLVITE) 1 MG tablet Take 1 tablet (1 mg total) by mouth at bedtime. 30 tablet 5   ??? levothyroxine (SYNTHROID) 100 MCG tablet Take 1 tablet (100 mcg total) by mouth daily. 30 tablet 5   ??? lithium (LITHOBID) 300 MG ER tablet Take 1 tablet (300 mg total) by mouth at bedtime. 30 tablet 5   ??? magnesium oxide-Mg AA chelate (MAGNESIUM, AMINO ACID CHELATE,) 133 mg Tab Take 1 tablet by mouth two (2) times a day. 100 tablet 2   ??? nystatin (MYCOSTATIN) 100,000 unit/mL suspension Take 5 mL (500,000 Units total) by mouth Four (4) times a day. 60 mL 0   ??? ondansetron (ZOFRAN) 8 MG tablet Take 1 tablet (8 mg total) by mouth every twelve (12) hours as needed for nausea. 30 tablet 2   ??? polyethylene glycol (MIRALAX) 17 gram packet Take 17 g by mouth daily as needed (constipation).     ??? posaconazole (NOXAFIL) 100 mg TbEC delayed released tablet Take 3 tablets (300 mg total) by mouth daily. 90 tablet 1   ??? senna-docusate (SENNOSIDES-DOCUSATE SODIUM) 8.6-50 mg Take 1 tablet by mouth at bedtime.     ??? tacrolimus (PROGRAF) 0.5 MG capsule Take 3 capsules (1.5 mg total) by mouth two (2) times a day. 180 capsule 5   ??? TRINTELLIX 10 mg tablet Take 1 tablet (10 mg total) by mouth at bedtime. 30 tablet 5   ??? valGANciclovir (VALCYTE) 450 mg tablet Take 1 tablet (450 mg total) by mouth two (2) times a day. 120 tablet 0   ??? zolpidem (AMBIEN) 5 MG tablet Take 1 to 2 tablets (5 mg-10 mg) as needed for insomnia. 60 tablet 0     No current facility-administered medications for this visit.     Objective:  BP 128/59  - Pulse 72  - Temp 36.5 ??C (97.7 ??F) (Temporal)  - Resp 18  - Ht 165.1 cm (5' 5)  - Wt 55 kg (121 lb 3.2 oz)  - LMP  (LMP Unknown)  - SpO2 99%  - BMI 20.17 kg/m??   Physical Exam:  General appearance - Thin WF,  appears well, no distress noted  Mental status - affect appropriate to mood  Eyes - conjunctivae clear, sclera anicteric   ENT - mucous membranes moist, no ulcerations noted or erythema, normal non-tender sinuses. Tongue is coated  Neck - supple, no palpable LAD  Chest - lungs are clear to auscultation bilaterally without wheezes/rales/rhonchi   Heart - regular rate and rhythm without murmur, rub, or gallop   Abdomen - soft, bowel sounds within normal limits, no masses, no tenderness, no hepatosplenomegaly   Neurological - motor and sensory grossly normal bilaterally  Musculoskeletal - no joint tenderness, deformity or swelling, no muscular tenderness noted  Extremities - no pedal edema noted  Skin - no skin breakdown noted, no rash dry  Venous access - line site is non-tender, no erythema/drainage    Karnofsky/Lansky Performance Status  80, Normal activity with effort; some signs or symptoms of disease (ECOG equivalent 1)    Test Results:   Reviewed in EPIC. Abnormal values discussed below.     Assessment/Plan:  Susan Kane is 59 yo woman with CLL as well as AA/bone marrow failure.  ??  CLL: CT C/A/P on 10/10/19 revealed no evidence of LAD or organomegaly. Possible endometrial fluid or  thickening in the fundus of the uterus.  10/07/19 BMBx: Scant bone marrow sampling (<5% cellular) essentially devoid of hematopoietic elements.  - ??Persistent CLL representing 23% of marrow cellularity by flow cytometric analysis.  -  Corresponding CBC: WBC 1.0, ANC 0.1.  ????????  ??  12/26/19: BmBx:   - ??Hypocellular (20% overall) involved by chronic lymphocytic leukemia/small lymphocytic lymphoma, representing approximately 20% of marrow cells by PAX5 immunohistochemistry.  - Normal karyotype: 46,XX[20]    02/10/20: BmBx:  -Markedly hypocellular bone marrow (< 5%) with essentially absent hematopoiesis. Normal Recipient and Donor karyotype:  46,XX[2]//46,XY[7].       BMT:??SD (stable disease)  HCT-CI (age adjusted) 5??(active CLL, psych, age adjusted)    Conditioning:  1. Rituximab 375 mg/m2 on day -13 and 1000 mg/m2 on D-6, +1, and +8  2. Bendamustine 130 mg/m2 IV daily over 10 minutes on D-5 through -3  3. Fludarabine 30 mg/m2 IV daily over 30 minutes on D-5 through -3  ??  Donor: 10/10, ABO identical; O - , CMV negative  Cell dose: 5.51x10^6 cells/kg    Engraftment:   - Granix D+12 through WBC recovery (ANC 1.0 x 2 days or 3.0 x 1 day)  - Viral studies showed weakly positive EBV, CMV, HHV-6, plan to monitor for now  - D+30 BMBx: markedly hypocellular marrow (<5%) with essentially absent hematopoiesis.   Chimerisms:     Ref. Range 02/09/2020 00:23 02/10/2020 14:45 03/08/2020 13:49 03/08/2020 13:49 8/23   Specimen Type Unknown Blood Bone Marrow Blood Blood    T-Cell (CD3) Recipient: Latest Units: %    >95 >95   T-Cell (CD3) Donor: Latest Units: %    <5 <5   Unfract. Recipient: Latest Units: % 37 17 14  15    Unfract. Donor: Latest Units: % 63 83 86  85     03/24/20: D+70. Poor engraftment.   85% donor in UF compartment and <5% donor in CD3 compartment.   Counts remain low on 100 mg eltrobopag. Still requires PRBC and occasionally plt.     GvHD prophylaxis:   1. Tacrolimus goal 5-10, dose adjustments per BMT pharmacy.   ??** Current dose 1.5mg  BID  2. Completed Methotrexate 5 mg/m2 on D+1, +3, +6 and +11  3. rATG 1 mg/kg on D-2 and -1 included  ??  Heme:  Transfusion criteria: ABO identical donor [O-]   - Standard txf criteria: 1 unit of PRBCs for Hgb<7 and 1 unit of platelets for Plt <10K or bleeding.     ** No history of transfusion reactions.   - Granix for WBC of 1 or less [given 8/9, 03/03/20]   - Udenyca 6mg  x1 given 03/05/20  - Has been requiring PRBCs and platelets [last transfused 03/14/20].   03/15/20: Remains with cytopenias [CMV vs medication effect]. WBC/ANC improved slightly today. D+60 DNA studies sent today.   8/27: DNA still pending. We did not get a lasting response to udenyca 2 weeks ago. She has been neutropenic for the last week.   We did not layer granix on top of the undenyca. Now that 2 weeks has passed we will start granix. 300 mcg given today.  Plan for blood products in infusion on Sunday.   8/30:  She will not get any products today. Granix given today 300 mcg.  03/24/20: Stable. No blood products.     ID:????  Prophylaxis:  - Antiviral: Valcyte as below for CMV viremia.   - Antifungal: Posaconazole 300 mg daily  - Antibacterial:??If ANC remains <0.5  on next lab check, will start Cefdinir.    - PJP: S/p pentamidine on 02/21/20, can replace with Bactrim once platelets engraft. For now will continue with monthly Pentamidine next on 03/24/20.    CMV:  02/24/20: CMV 207  03/02/20: CMV 884. Valganciclovir started.    03/08/20: CMV decreased after 1 week of therapy to 140.  03/15/20: Repeat pending, if <50 can go to maintenance dosing.  03/17/20: Last CMV <50. Decreased valcyte to 450 mg BID.  ** Letermovir denied. Appeal in progress.    8/27: Maintenance dosing ends on 9/8.  03/22/20: CMV undetectable.   ??  HHV-6:   - Peaked on PCR at 41.2, now negative.   ??  EBV: negative,  - Weekly, pending Neutropenic fever  03/02/20 Fever at home 100.4 and low grade in ER of 99.5. ??  - Cx NGTD  - IV cefepime (8/8-8/10)  - Now afebrile.   ??  GI:??  GERD Prophylaxis: Pepcid.  This is now PRN.   ??  CINV:  - Anti-emetics per BMT protocol.   - Zofran 8mg  prn [taking bid]  - Nightly zyprexa.     Coated Tongue:   - Nystatin solution PRN.   Improved.  ??  Renal:   - No active issues.   ??  FEN:   - Continue folic acid.    Hypomagnesemia:  - Mag Chelate 1 tablet BID.   ??  Endocrine:  Hypothyroid:??  - Continue Synthroid daily.   - TSH, T3, T4 - normal   ??  Hepatic:??  - VOD prophylaxis not warranted as non-myeloablative regimen.   ??  Hepatocellular liver injury pattern:  - Mild increase in AST, ALT, and AlkPhos, likely secondary to posaconazole. Stable today.   ??  CV:  HTN: Now normotensive off treatment.   ??  Pulm:  AHRF: First noted on 7/22. Intermittent O2 requirement with exam findings concerning for LLL crackles. No signs of volume overload on exam. Chest-X ray clear. Now on RA.     Neuro/Pain:??  - No c/o at this time    Psych:??Has a home psychiatrist.   Depression/Anxiety:   - Per outpt note pt was taking Xanax 0.25 mg BID PRN for anxiety but this was not on her home med list thus I did not re-order at this time. Discussed with patient who stated she rarely takes.  - Lithium 300mg  nightly   03/15/20: Checking with home prescriber to see if they want [place] to check a level; [last 12/2019]   - Trintellix 10mg  nightly  ??  Insomnia:  - Ambien 10mg  nightly PRN  ??  Caregiver/Lodging:  - Husband [name] will be primary caregiver  - Staying at local rental.     SUMMARY: Clinically patient continues to recover from toxicities of transplant. She is doing quite well.   However, poor engraftment is very concerning. Will repeat BMBx and based on the results will decide further treatment plan.   CD34+ selected boost would be least toxic, if possible. Although, the lact of CD3+ engraftment may be problematic and the CD34+ boost may not address this.   Second transplant using primarily an immunosuppressive regimen may be a better solution.   She had an unrelated donor and will check what we have left from original collection. May need to approach the Registry to recollect the donor.   BMBx - will schedule soon. Would like to give the patient a chance to recover from CMV infection and Valcyte.     Beverley Fiedler  Cherene Julian, MD  Bone Marrow Transplant and Cellular Therapy Program    I personally spent 90  minutes face-to-face and non-face-to-face in the care of this patient, which includes all pre, intra, and post visit time on the date of service. We discussed in detail the course of treatment to date and the poor engraftment. We further reviewed the further treatment plan.     Future Appointments   Date Time Provider Department Center   03/24/2020 11:45 AM ONCBMT LABS [facility]   03/24/2020 12:15 PM Artelia Laroche, MD [facility]   03/24/2020  1:30 PM ONCINF CHAIR 02 [facility]   03/29/2020 12:30 PM ONCINF CHAIR 02 [facility]   03/31/2020 10:15 AM ONCBMT LABS [facility]   03/31/2020 10:45 AM Artelia Laroche, MD [facility]   03/31/2020 12:00 PM ONCINF CHAIR 07 [facility]   04/02/2020 10:45 AM ONCBMT LABS [facility]   04/02/2020 11:15 AM ONCBMT APP B [facility]   04/02/2020 12:30 PM ONCINF CHAIR 02 [facility]   04/05/2020  9:45 AM ONCBMT LABS [facility]   04/05/2020 10:15 AM ONCBMT APP B [facility]   04/05/2020 11:30 AM ONCINF CHAIR 02 [facility]   04/07/2020 10:45 AM ONCBMT LABS [facility]   04/07/2020 11:15 AM ONCBMT APP B [facility]   04/07/2020 12:30 PM ONCINF CHAIR 03 [facility]   04/09/2020  9:45 AM ONCBMT LABS [facility]   04/09/2020 10:15 AM ONCBMT APP B [facility]   04/09/2020 11:30 AM ONCINF CHAIR 02 [facility]   04/12/2020 10:30 AM ONCBMT LABS [facility]   04/12/2020 11:00 AM ONCBMT APP A [facility]   04/12/2020 12:00 PM ONCINF CHAIR 02 [facility]   04/14/2020 10:30 AM ONCBMT LABS [facility]   04/14/2020 11:00 AM ONCBMT APP A [facility]   04/14/2020 12:00 PM ONCINF CHAIR 05 [facility]   04/16/2020 10:30 AM ONCBMT LABS [facility]   04/16/2020 11:00 AM ONCBMT APP A [facility]   04/21/2020 10:45 AM ONCBMT LABS [facility]   04/21/2020 11:15 AM ONCBMT APP B [facility]   04/21/2020 12:30 PM ONCBMT PROCEDURES [facility]   04/27/2020 10:15 AM ONCBMT LABS [facility]   04/27/2020 10:45 AM Artelia Laroche, MD [facility]   06/08/2020 11:00 AM ONCINF CHAIR 04 [facility]

## 2020-03-24 NOTE — Unmapped (Unsigned)
Bone Marrow Transplant and Cellular Therapy Program  Immunosuppressive Therapy Note    Susan Kane is a 59 y.o. 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 Susan Kane is currently day +70.    Current dose: tacrolimus 1.5 mg PO BID    Goal tacrolimus Level: 5-10 ng/mL    Resulted level: *** ng/mL     Lab Results   Component Value Date/Time    TACROLIMUS 6.8 03/22/2020 11:23 AM    TACROLIMUS 4.6 03/19/2020 12:24 PM    TACROLIMUS 4.9 (L) 03/17/2020 11:18 AM    TACROLIMUS 5.7 03/15/2020 09:38 AM    TACROLIMUS 6.0 03/12/2020 01:10 PM    TACROLIMUS 7.6 03/10/2020 07:31 AM     Lab Results   Component Value Date/Time    CREATININE 1.01 (H) 03/24/2020 11:40 AM    CREATININE 1.01 (H) 03/22/2020 11:23 AM    CREATININE 1.15 (H) 03/21/2020 09:20 AM    CREATININE 1.11 (H) 03/19/2020 12:24 PM      Assessment/Recommendation: Tacrolimus level remains therapeutic. Her SCr is trending back down, but not back to baseline yet. Alk phos and ALT are mildly elevated. AST and TBili are WNL. Given the above, plan to continue current tacrolimus dose of 1.5 mg PO BID and will recheck at next clinic visit to further assess trend and need for adjustment.     We will continue to monitor levels.  Patient will be followed for changes in renal and hepatic function, toxicity, and efficacy.     Rulon Abide, PharmD CPP  [facility] Bone Marrow Transplant and Cellular Therapy Program

## 2020-03-24 NOTE — Telephone Encounter (Signed)
Call to patient to f/u on status since transplant. She reports today is day 7. Saw Dr. Theodoro Clock today and she feels the graft did not take as well has she had hoped. Will have bone marrow biopsy next week and then decide on next step. Was given option of going to [facility] w/special machine for the donor cells or re-do the transplant with chemo again for 5 days at a lesser dose. Still getting blood/platelets 1-2/week. Over feels pretty well.

## 2020-03-26 ENCOUNTER — Encounter: Admit: 2020-03-26 | Discharge: 2020-03-26 | Payer: PRIVATE HEALTH INSURANCE

## 2020-03-26 ENCOUNTER — Ambulatory Visit
Admit: 2020-03-26 | Discharge: 2020-03-26 | Payer: PRIVATE HEALTH INSURANCE | Attending: Primary Care | Primary: Primary Care

## 2020-03-26 DIAGNOSIS — K5900 Constipation, unspecified: Secondary | ICD-10-CM | POA: Diagnosis not present

## 2020-03-26 DIAGNOSIS — Z79899 Other long term (current) drug therapy: Secondary | ICD-10-CM | POA: Diagnosis not present

## 2020-03-26 DIAGNOSIS — Z9481 Bone marrow transplant status: Secondary | ICD-10-CM | POA: Diagnosis not present

## 2020-03-26 DIAGNOSIS — F329 Major depressive disorder, single episode, unspecified: Secondary | ICD-10-CM | POA: Diagnosis not present

## 2020-03-26 DIAGNOSIS — G4700 Insomnia, unspecified: Secondary | ICD-10-CM | POA: Diagnosis not present

## 2020-03-26 DIAGNOSIS — B259 Cytomegaloviral disease, unspecified: Secondary | ICD-10-CM | POA: Diagnosis not present

## 2020-03-26 DIAGNOSIS — E039 Hypothyroidism, unspecified: Secondary | ICD-10-CM | POA: Diagnosis not present

## 2020-03-26 DIAGNOSIS — C9110 Chronic lymphocytic leukemia of B-cell type not having achieved remission: Secondary | ICD-10-CM | POA: Diagnosis not present

## 2020-03-26 DIAGNOSIS — F419 Anxiety disorder, unspecified: Secondary | ICD-10-CM | POA: Diagnosis not present

## 2020-03-26 DIAGNOSIS — Z792 Long term (current) use of antibiotics: Secondary | ICD-10-CM | POA: Diagnosis not present

## 2020-03-26 DIAGNOSIS — D702 Other drug-induced agranulocytosis: Secondary | ICD-10-CM | POA: Diagnosis not present

## 2020-03-26 DIAGNOSIS — Z48298 Encounter for aftercare following other organ transplant: Secondary | ICD-10-CM | POA: Diagnosis not present

## 2020-03-26 DIAGNOSIS — Z9484 Stem cells transplant status: Secondary | ICD-10-CM | POA: Diagnosis not present

## 2020-03-26 DIAGNOSIS — D696 Thrombocytopenia, unspecified: Principal | ICD-10-CM

## 2020-03-26 DIAGNOSIS — D849 Immunodeficiency, unspecified: Principal | ICD-10-CM

## 2020-03-26 LAB — CBC W/ AUTO DIFF
BASOPHILS ABSOLUTE COUNT: 0 10*9/L (ref 0.0–0.1)
BASOPHILS RELATIVE PERCENT: 0.2 %
EOSINOPHILS ABSOLUTE COUNT: 0 10*9/L (ref 0.0–0.4)
EOSINOPHILS RELATIVE PERCENT: 0 %
HEMATOCRIT: 28 % — ABNORMAL LOW (ref 36.0–46.0)
HEMOGLOBIN: 10 g/dL — ABNORMAL LOW (ref 12.0–16.0)
LARGE UNSTAINED CELLS: 4 % (ref 0–4)
LYMPHOCYTES ABSOLUTE COUNT: 0.2 10*9/L — ABNORMAL LOW (ref 1.5–5.0)
LYMPHOCYTES RELATIVE PERCENT: 34.3 %
MEAN CORPUSCULAR HEMOGLOBIN: 28.8 pg (ref 26.0–34.0)
MEAN CORPUSCULAR VOLUME: 81 fL (ref 80.0–100.0)
MEAN PLATELET VOLUME: 7.9 fL (ref 7.0–10.0)
MONOCYTES ABSOLUTE COUNT: 0.1 10*9/L — ABNORMAL LOW (ref 0.2–0.8)
MONOCYTES RELATIVE PERCENT: 10.4 %
NEUTROPHILS RELATIVE PERCENT: 51.5 %
PLATELET COUNT: 13 10*9/L — ABNORMAL LOW (ref 150–440)
RED BLOOD CELL COUNT: 3.46 10*12/L — ABNORMAL LOW (ref 4.00–5.20)
RED CELL DISTRIBUTION WIDTH: 15.8 % — ABNORMAL HIGH (ref 12.0–15.0)
WBC ADJUSTED: 0.7 10*9/L — ABNORMAL LOW (ref 4.5–11.0)

## 2020-03-26 LAB — COMPREHENSIVE METABOLIC PANEL
ALBUMIN: 4.3 g/dL (ref 3.4–5.0)
ALKALINE PHOSPHATASE: 147 U/L — ABNORMAL HIGH (ref 46–116)
ALT (SGPT): 52 U/L — ABNORMAL HIGH (ref 10–49)
ANION GAP: 6 mmol/L (ref 5–14)
AST (SGOT): 22 U/L (ref <=34)
BILIRUBIN TOTAL: 0.8 mg/dL (ref 0.3–1.2)
BLOOD UREA NITROGEN: 26 mg/dL — ABNORMAL HIGH (ref 9–23)
BUN / CREAT RATIO: 24
CALCIUM: 9.4 mg/dL (ref 8.7–10.4)
CHLORIDE: 107 mmol/L (ref 98–107)
CO2: 24 mmol/L (ref 20.0–31.0)
CREATININE: 1.09 mg/dL — ABNORMAL HIGH
EGFR CKD-EPI AA FEMALE: 64 mL/min/{1.73_m2} (ref >=60)
EGFR CKD-EPI NON-AA FEMALE: 55 mL/min/{1.73_m2} — ABNORMAL LOW (ref >=60)
GLUCOSE RANDOM: 137 mg/dL (ref 70–179)
PROTEIN TOTAL: 6.5 g/dL (ref 5.7–8.2)
SODIUM: 137 mmol/L (ref 135–145)

## 2020-03-26 LAB — WBC ADJUSTED: Leukocytes:NCnc:Pt:Bld:Qn:: 0.7 — ABNORMAL LOW

## 2020-03-26 LAB — TACROLIMUS, TROUGH: Lab: 6.1

## 2020-03-26 LAB — CHLORIDE: Chloride:SCnc:Pt:Ser/Plas:Qn:: 107

## 2020-03-26 LAB — MAGNESIUM: Magnesium:MCnc:Pt:Ser/Plas:Qn:: 2

## 2020-03-26 MED ORDER — MG-PLUS-PROTEIN 133 MG TABLET
ORAL_TABLET | Freq: Every day | ORAL | 2 refills | 100 days
Start: 2020-03-26 — End: ?

## 2020-03-26 MED ADMIN — tbo-filgrastim (GRANIX) injection 300 mcg: 300 ug | SUBCUTANEOUS | @ 14:00:00 | Stop: 2020-03-26

## 2020-03-26 MED ADMIN — heparin, porcine (PF) 100 unit/mL injection 200 Units: 200 [IU] | INTRAVENOUS | @ 12:00:00 | Stop: 2020-03-26

## 2020-03-26 NOTE — Unmapped (Signed)
Bone Marrow Transplant and Cellular Therapy Program  Immunosuppressive Therapy Note    Susan Kane is a 59 y.o. 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 Susan Kane is currently day +72.    Current dose: tacrolimus 1.5 mg PO BID    Goal tacrolimus Level: 5-10 ng/mL    Resulted level: 6.1 ng/mL    Lab Results   Component Value Date/Time    TACROLIMUS 6.1 03/26/2020 08:11 AM    TACROLIMUS 4.5 (L) 03/24/2020 11:40 AM    TACROLIMUS 6.8 03/22/2020 11:23 AM    TACROLIMUS 4.6 03/19/2020 12:24 PM    TACROLIMUS 4.9 (L) 03/17/2020 11:18 AM    TACROLIMUS 5.7 03/15/2020 09:38 AM     Lab Results   Component Value Date/Time    CREATININE 1.09 (H) 03/26/2020 08:11 AM    CREATININE 1.01 (H) 03/24/2020 11:40 AM    CREATININE 1.01 (H) 03/22/2020 11:23 AM    CREATININE 1.15 (H) 03/21/2020 09:20 AM      Assessment/Recommendation: Tacrolimus level remains therapeutic. Her SCr remains elevated but not appreciably different from last visit. Alk phos and ALT are mildly elevated but stable. AST and TBili are WNL. Given the above, plan to continue current tacrolimus dose of 1.5 mg PO BID and will recheck at next clinic visit to further assess trend and need for adjustment.     We will continue to monitor levels.  Patient will be followed for changes in renal and hepatic function, toxicity, and efficacy.     Bettey Costa, PharmD, BCPS, BCOP  BMT Clinical Pharmacist Practitioner

## 2020-03-26 NOTE — Unmapped (Signed)
BMT-CT Clinic Follow-up    Patient Name: Susan Kane  MRN: [id number]  Encounter date: 03/26/2020    Referring Physician: Dr. Jillyn Hidden Nida Boatman) [name]  Primary Care Provider: Thora Lance, MD  BMT Attending MD: Dr. Oswald Hillock    Disease: CLL  Current disease status: SD (stable disease)  Type of Transplant: RIC MUD Allo  Graft Source: Cryopreserved PBSCs  Transplant Day: +72    Study Participant: BMT CTN 1704: Composite Health Assessment Risk Model for Older Adults: Applying Pre-transplant Comorbidity, Geriatric Assessment and Biomarkers to Predict Non-Relapse Mortality After Allogeneic Transplant (CHARM).  ??  HPI:  Susan Kane??is a 59 y.o.??woman??with a diagnosis of CLL??and aplastic anemia who is now s/p RIC MUD AlloSCT. Her transplant course was complicated by delayed engraftment. ??She was initially discharged from the hospital on 02/23/2020. However was re-admitted on 02/27/20 with a fever and need for workup and IV cefepime. Infectious workup was unrevealing, IV cefepime was stopped on 03/02/20, and she was once again discharged on 03/03/20.      Interval History:   Susan Kane is here for routine follow up after transplant. She is here with her husband. She is feeling very well overall, energy level slowly improving.  Has no nausea.  She is using Zofran as a preventative in the morning and evening before meds. She is drinking coffee and having pastry from [employer] this morning. She feels her appetite is much better and she is eating a variety of foods now. She is still having constipation and is using laxatives prn Senna/Colace. No skin rash, dry eye or dry mouth. She and her husband had some basic questions about next steps regarding possibility  infusing more cells to boost her graft, we discussed that the process Dr. Oswald Hillock is looking into can reduce the incidence of GVHD with the selection of the stem cells. She will be scheduled for another bone marrow biopsy soon.         Oncology History Overview Note CLL:    Summary as per Dr. Kalman Drape most recent note with minor edits/additions from my review of records    1.??CLL?????diagnosed in August 2010, flow cytometry consistent with CLL  ?? Enlarged left??inguinal lymph node January 2019,??small??neck/axillary nodes and palpable splenomegaly 09/11/2017  ?? CTs??on 09/17/2017-3 cm necrotic appearing lymph node in the left inguinal region, borderline enlarged pelvic/retroperitoneal, chest, and axillary nodes. ??Mild splenomegaly.  ?? Ultrasound-guided biopsy of the left inguinal lymph node 09/18/2017, slightly purulent fluid aspirated, core biopsy is consistent with an atypical lymphoid proliferation???extensive necrosis with surrounding epithelioid histiocytes, limited intact lymphoid tissue involved with CLL  ?? Incisional biopsy of a necrotic/purulent left inguinal lymph node on 10/01/2017???extensive necrosis with granulomatous inflammation, small amount of viable lymphoid tissue involved with CLL, AFB and fungal stains negative  ?? Peripheral blood FISH analysis 02/05/2018??? +deletion 13q14, no evidence of p53 (17p13) deletion, no evidence of 11q22??deletion  - normal karyotype (46,XX)  ?? Bone marrow biopsy 02/26/2018???hypercellular marrow with extensive involvement by CLL, lymphocytes represent??85% of all cells  ?? Ibrutinib initiated 04/03/2018  ?? Ibrutinib placed on hold 04/11/2018 due to onset of arthralgias  ?? Ibrutinib resumed 04/16/2018, discontinued 04/25/2018 secondary to severe arthralgias/arthritis  ?? Ibrutinib resumed at a dose of 140 mg daily 05/03/2018  ?? Ibrutinib dose adjusted to 140 mg alternating with 280 mg 06/25/2018  ?? Ibrutinib discontinued 07/03/2018 secondary to severe arthralgias  2.??Hypothyroidism  3.??Hepatitis B surface and core antibody positive  4.????Left lung pneumonia diagnosed 10/08/2017???completed 7 days of Levaquin  5.????Left  lung pneumonia??on chest x-ray 12/27/2017. ??Augmentin prescribed.  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    Baseline BM bx reviewed at [facility]  Final Diagnosis   (Outside Case #:  [id number], dated 02/26/2018)  Bone marrow, aspiration and biopsy  -  Hypercellular bone marrow (80%) with extensive involvement by chronic lymphocytic leukemia (87% lymphocytes by manual aspirate differential)  (See Comment)  -  By outside report, cytogenetic results are normal     Karyotype: 72, XX and FISH with 13q del but no 11q or 17p del    Repeat BM bx: (done after being off ibrutinib x1 month)  80% involvement by CLL    08/13/18: Presents to [facility] to discuss plan of care     Chronic lymphocytic leukemia (CLL), B-cell (CMS-HCC)   07/12/2018 Initial Diagnosis    Chronic lymphocytic leukemia (CLL), B-cell (CMS-HCC)     08/26/2018 -  Chemotherapy    Acalabrutinib started (approximate date)     11/14/2018 -  Chemotherapy    Acalabrutinib discontinued due to progressive bone marrow failure     12/06/2018 -  Chemotherapy    Ritux x1 given due to CLL still making up majority of BM cellularity (though in the setting of near aplasia of rest of TLH)     03/19/2019 -  Chemotherapy    Equine ATG / CsA for aplastic anemia (suspect 2/t acalabrutinib)     Chronic lymphocytic leukemia of B-cell type not having achieved remission (CMS-HCC)   01/21/2019 Initial Diagnosis    Chronic lymphocytic leukemia of B-cell type not having achieved remission (CMS-HCC)     01/01/2020 -  Chemotherapy    BMT IP BENDAMUSTINE / FLUDARABINE / RITUXIMAB (OP RITUXIMAB Days -13 & -6) (MUD)  bendamustine 130 mg/m2 IV on days -5, -4, -3   fludarabine 30 mg/m2 IV on days -5, -4, -3   riTUXimab 375 mg/m2 IV on day -13 as a OP treatment day, then 1,000 mg/m2 on days -6 as a OP treatment day, +1, +8   tacrolimus, 0.045 mg/kg PO BID on days -3 and -2 then  0.03 mg/kg PO BID starting on day -1   methotrexate 5 mg/m2 IV on days +1, +3, +6, +11 tbo-filgrastim 300 mcg (<= 75 kg) / 480 mcg (> 75 kg) q24h starting on day +7        Patient Active Problem List   Diagnosis   ??? Chronic lymphocytic leukemia (CLL), B-cell (CMS-HCC)   ??? Hypogammaglobulinemia (CMS-HCC)   ??? Chronic lymphocytic leukemia of B-cell type not having achieved remission (CMS-HCC)   ??? Aplastic anemia (CMS-HCC)   ??? 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 Drug-induced nausea and vomiting   ??? Stem cell transplant candidate   ??? Chemotherapy induced neutropenia (CMS-HCC)   ??? History of allogeneic stem cell transplant (CMS-HCC)   ??? Osteopenia   ??? Neutropenia, drug-induced (CMS-HCC)   ??? Immunosuppressed status (CMS-HCC)     Review of Systems:  A full system review was performed and was negative except as noted in the above interval history.    Reviewed and updated past medical, surgical, social, and family history as appropriate.      No Known Allergies  Current Outpatient Medications   Medication Sig Dispense Refill   ??? tacrolimus-vehicle base no.238 0.1 % Crea      ??? calcium carbonate-vitamin D3 600 mg(1,500mg ) -800 unit Tab Take 1  tablet by mouth Two (2) times a day. (Patient not taking: Reported on 03/05/2020) 60 tablet 5   ??? eltrombopag (PROMACTA) 50 MG tablet Take 2 tablets (100 mg total) by mouth daily. Administer on an empty stomach, 1 hour before or 2 hours after a meal. 60 tablet 2   ??? famotidine (PEPCID) 20 MG tablet Take 1 tablet (20 mg total) by mouth two (2) times a day as needed for heartburn. 60 tablet 0   ??? folic acid (FOLVITE) 1 MG tablet Take 1 tablet (1 mg total) by mouth at bedtime. 30 tablet 5   ??? levothyroxine (SYNTHROID) 100 MCG tablet Take 1 tablet (100 mcg total) by mouth daily. 30 tablet 5   ??? lithium (LITHOBID) 300 MG ER tablet Take 1 tablet (300 mg total) by mouth at bedtime. 30 tablet 5   ??? magnesium oxide-Mg AA chelate (MAGNESIUM, AMINO ACID CHELATE,) 133 mg Tab Take 1 tablet by mouth daily. 100 tablet 2   ??? ondansetron (ZOFRAN) 8 MG tablet Take 1 tablet (8 mg total) by mouth every twelve (12) hours as needed for nausea. 30 tablet 2   ??? polyethylene glycol (MIRALAX) 17 gram packet Take 17 g by mouth daily as needed (constipation).     ??? posaconazole (NOXAFIL) 100 mg TbEC delayed released tablet Take 3 tablets (300 mg total) by mouth daily. 90 tablet 1   ??? senna-docusate (SENNOSIDES-DOCUSATE SODIUM) 8.6-50 mg Take 1 tablet by mouth at bedtime.     ??? tacrolimus (PROGRAF) 0.5 MG capsule Take 3 capsules (1.5 mg total) by mouth two (2) times a day. 180 capsule 5   ??? TRINTELLIX 10 mg tablet Take 1 tablet (10 mg total) by mouth at bedtime. 30 tablet 5   ??? valGANciclovir (VALCYTE) 450 mg tablet Take 1 tablet (450 mg total) by mouth two (2) times a day. 120 tablet 0   ??? zolpidem (AMBIEN) 5 MG tablet Take 1 to 2 tablets (5 mg-10 mg) as needed for insomnia. 60 tablet 0     No current facility-administered medications for this visit.     Objective:  Vitals:    03/26/20 0818   BP: 126/75   Pulse: 90   Resp: 16   Temp: 36.4 ??C (97.6 ??F)   TempSrc: Temporal   SpO2: 98%   Weight: 54.3 kg (119 lb 12.8 oz)   Height: 165.1 cm (5' 5)     Physical Exam:  General appearance - Thin WF,  appears well, no distress noted  Mental status - affect appropriate to mood  Eyes - conjunctivae clear, sclera anicteric   ENT - mucous membranes moist, no ulcerations noted or erythema, normal non-tender sinuses. Tongue is coated  Neck - supple, no palpable LAD  Chest - lungs are clear to auscultation bilaterally without wheezes/rales/rhonchi   Heart - regular rate and rhythm without murmur, rub, or gallop   Abdomen - soft, bowel sounds within normal limits, no masses, no tenderness, no hepatosplenomegaly   Neurological - motor and sensory grossly normal bilaterally  Musculoskeletal - no joint tenderness, deformity or swelling, no muscular tenderness noted  Extremities - no pedal edema noted  Skin - no skin breakdown noted, no rash dry  Venous access - line site is non-tender, no erythema/drainage    Karnofsky/Lansky Performance Status  80, Normal activity with effort; some signs or symptoms of disease (ECOG equivalent 1)    Test Results:   Reviewed in EPIC. Abnormal values discussed below.     Assessment/Plan:  Mrs.  Bennick is 59 yo woman with CLL as well as AA/bone marrow failure.  ??  CLL: CT C/A/P on 10/10/19 revealed no evidence of LAD or organomegaly. Possible endometrial fluid or thickening in the fundus of the uterus.  10/07/19 BMBx: Scant bone marrow sampling (<5% cellular) essentially devoid of hematopoietic elements.  - ??Persistent CLL representing 23% of marrow cellularity by flow cytometric analysis.  -  Corresponding CBC: WBC 1.0, ANC 0.1.  ????????  ??  12/26/19: BmBx:   - ??Hypocellular (20% overall) involved by chronic lymphocytic leukemia/small lymphocytic lymphoma, representing approximately 20% of marrow cells by PAX5 immunohistochemistry.  - Normal karyotype: 46,XX[20]    02/10/20: BmBx:  -Markedly hypocellular bone marrow (< 5%) with essentially absent hematopoiesis. Normal Recipient and Donor karyotype:  46,XX[2]//46,XY[7].     Next bone marrow biopsy scheduled on 9/29 unless we decide to do sooner.       BMT:??SD (stable disease)  HCT-CI (age adjusted) 5??(active CLL, psych, age adjusted)    Conditioning:  1. Rituximab 375 mg/m2 on day -13 and 1000 mg/m2 on D-6, +1, and +8  2. Bendamustine 130 mg/m2 IV daily over 10 minutes on D-5 through -3  3. Fludarabine 30 mg/m2 IV daily over 30 minutes on D-5 through -3  ??  Donor: 10/10, ABO identical; O - , CMV negative  Cell dose: 5.51x10^6 cells/kg    Engraftment:   - Granix D+12 through WBC recovery (ANC 1.0 x 2 days or 3.0 x 1 day)  - Viral studies showed weakly positive EBV, CMV, HHV-6, plan to monitor for now  - D+30 BMBx: markedly hypocellular marrow (<5%) with essentially absent hematopoiesis.     Chimerisms:     Ref. Range 02/09/2020 00:23 02/10/2020 14:45 03/08/2020 13:49 03/08/2020 13:49 8/23   Specimen Type Unknown Blood Bone Marrow Blood Blood    T-Cell (CD3) Recipient: Latest Units: %    >95 >95   T-Cell (CD3) Donor: Latest Units: %    <5 <5   Unfract. Recipient: Latest Units: % 37 17 14  15    Unfract. Donor: Latest Units: % 63 83 86  85       GvHD prophylaxis:   1. Tacrolimus goal 5-10, dose adjustments per BMT pharmacy.   ??** Current dose 1.5mg  BID  2. Completed Methotrexate 5 mg/m2 on D+1, +3, +6 and +11  3. rATG 1 mg/kg on D-2 and -1 included  ??  Heme:  Transfusion criteria: ABO identical donor [O-]   - Standard txf criteria: 1 unit of PRBCs for Hgb<7 and 1 unit of platelets for Plt <10K or bleeding.     ** No history of transfusion reactions.   - Granix for WBC of 1 or less [given today 9/3], tried [place] but did not get a lasting result, so now using Granix 300 mcg prn.    - Has been requiring PRBCs and platelets [last transfused 03/21/20].     ID:????  Prophylaxis:  - Antiviral: Valcyte as below for CMV viremia.   - Antifungal: Posaconazole 300 mg daily  - Antibacterial:??If ANC remains <0.5 on next lab check, will start Cefdinir.    - PJP: S/p pentamidine on 02/21/20, can replace with Bactrim once platelets engraft. For now will continue with monthly Pentamidine next on 03/24/20, will re-schedule following visit on 9/8.    CMV:  02/24/20: CMV 207  03/02/20: CMV 884. Valganciclovir started.    03/08/20: CMV decreased after 1 week of therapy to 140.  03/15/20: Repeat pending, if <50 can go to  maintenance dosing.  03/17/20: Last CMV <50. Decreased valcyte to 450 mg BID.  ** Letermovir denied. Appeal in progress.    03/22/20: CMV Not detected.   Maintenance dosing ends on 9/8  ??  HHV-6:   - Peaked on PCR at 41.2, now negative.   ??  EBV: negative,  - Weekly, pending 03/24/20     Neutropenic fever  03/02/20 Fever at home 100.4 and low grade in ER of 99.5. ??  - Cx NGTD  - IV cefepime (8/8-8/10)  - Now afebrile.   ??  GI:??  GERD Prophylaxis: Pepcid.  This is now PRN.   ??  Nausea:  - Zofran 8mg  prn [taking bid]    Coated Tongue:   - Nystatin solution PRN, improved.   ??  Renal:   - No active issues.   ??  FEN:   - Continue folic acid.    Hypomagnesemia:  - Mag Chelate 1 tablet BID, Mag level has been stable at 2.0, will reduce dose to 1 tablet daily, likely can stop if Mag remains normal.   ??  Endocrine:  Hypothyroid:??  - Continue Synthroid daily.   - TSH, T3, T4 - normal   ??  Hepatic:??  - VOD prophylaxis not warranted as non-myeloablative regimen.   ??  Hepatocellular liver injury pattern:  - Mild increase in AST, ALT, and AlkPhos, likely secondary to posaconazole. Stable today.   ??  Neuro/Pain:??  - No c/o at this time    Bone Health:   -03/05/20: DEXA scan results:  - spine L1 - L4 T score -1.4  - L proximal femur T score -1.9  - L femoral neck T score -1.8  Based on DEXA scan, patient has osteopenia of spine, femur and femoral neck. Recommend administering zoledronic acid 4 mg IV every 3 months x 2 doses in addition to calcium and vitamin D supplementation 600 mg/ 800 units twice daily. She can try replacing this with Vi active chews if she would like.   - Zometa given on 8/18, next dose due in November   ??    Psych:??Has a home psychiatrist.   Depression/Anxiety:   - Per outpt note pt was taking Xanax 0.25 mg BID PRN for anxiety but this was not on her home med list thus I did not re-order at this time. Discussed with patient who stated she rarely takes.  - Lithium 300mg  nightly   03/15/20: Checking with home prescriber to see if they want [place] to check a level; [last 12/2019]   - Trintellix 10mg  nightly  ??  Insomnia:  - Ambien 10mg  nightly PRN  ??  Caregiver/Lodging:  - Husband [name] will be primary caregiver  - Staying at local rental.       Summary:  Clovis Riley given today  Return to clinic this weekend on Sunday 9/5 for possible platelet transfusion.   She will follow up in clinic on Wed 9/8 with Dr. Oswald Hillock, needs to go to infusion for inhaled Pentam following visit. Therapy plan entered.      Almira Bar, ANP  Bone Marrow Transplant and Cellular Therapy Program    I personally spent 55  minutes face-to-face and non-face-to-face in the care of this patient, which includes all pre, intra, and post visit time on the date of service.    Future Appointments   Date Time Provider Department Center   03/28/2020  8:00 AM ONCINF CHAIR 05 [id number]   03/31/2020 10:15 AM ONCBMT LABS [facility]   03/31/2020 10:45 AM Artelia Laroche, MD [facility]   03/31/2020 12:00 PM ONCINF CHAIR 07 [id number]   04/02/2020 10:45 AM ONCBMT LABS [facility]   04/02/2020 11:15 AM ONCBMT APP B [facility]   04/02/2020 12:30 PM ONCINF CHAIR 02 [id number]   04/05/2020  9:45 AM ONCBMT LABS [facility]   04/05/2020 10:15 AM ONCBMT APP B [facility]   04/05/2020 11:30 AM ONCINF CHAIR 02 [facility]   04/07/2020 10:45 AM ONCBMT LABS [facility]   04/07/2020 11:15 AM ONCBMT APP B [facility]   04/07/2020 12:30 PM ONCINF CHAIR 03 [id number]   04/09/2020  9:45 AM ONCBMT LABS [facility]   04/09/2020 10:15 AM ONCBMT APP B [facility]   04/09/2020 11:30 AM ONCINF CHAIR 02 [facility]   04/12/2020 10:30 AM ONCBMT LABS [facility]   04/12/2020 11:00 AM ONCBMT APP A [facility]   04/12/2020 12:00 PM ONCINF CHAIR 02 [facility]   04/14/2020 10:30 AM ONCBMT LABS [facility]   04/14/2020 11:00 AM ONCBMT APP A [facility]   04/14/2020 12:00 PM ONCINF CHAIR 05 [facility]   04/16/2020 10:30 AM ONCBMT LABS [facility]   04/16/2020 11:00 AM ONCBMT APP A [facility]   04/21/2020 10:45 AM ONCBMT LABS [facility]   04/21/2020 11:15 AM ONCBMT APP B [facility]   04/21/2020 12:30 PM ONCBMT PROCEDURES [facility]   04/27/2020 10:15 AM ONCBMT LABS [facility]   04/27/2020 10:45 AM Artelia Laroche, MD [facility]   06/08/2020 11:00 AM ONCINF CHAIR 04 [facility]

## 2020-03-26 NOTE — Unmapped (Signed)
0945  Granix 300 mcg given to left lower abdomen slowly. Tolerated this well. Band-aid applied.

## 2020-03-26 NOTE — Unmapped (Signed)
It was nice seeing you today.     Blood counts are stable, but remain low.    Will give you a Granix booster shot today.     You will probably need platelets this weekend, we will ask infusion if you can some on Sunday instead of Monday.     CMV was not detected this week, continue the Valcyte this will stop next week, hopefully this will also help improve your counts. We will give you instructions on going back to your daily Valtrex.    Ok to decrease magnesium to 1 pill a day.    You can look for Viactive chews to replace the calcium pills if you like these chews better than swallowing the tablet.     I will call you if I need to make changes to your tacrolimus dose.          ------------------------------------------  COVID-19 information:  Given ongoing novel coronavirus (SARS-CoV-2/COVID-19), it is strongly recommended to avoid travel and crowds.  If you develop fever, cough, shortness of breath and/or have known exposure (close contact < 6 feet) with someone who has tested positive, please notify us immediately.    ??? Your best defenses are to stay home as much as possible and use good hand hygiene.  ??? We recommend that you wear a mask (N95 mask preferred) when leaving your home  ??? Important:   o A previous negative test does not mean you will never become infected  o It is unknown if a prior COVID-19 infection provides immunity against future infections    It is important for you to know that the available vaccines are safe.  However, they have not been tested in transplant/cellular therapy patients.  Therefore, we do not know if transplant/cellular therapy patients will develop an immune response (make antibody) against the virus.  We still recommend you obtain the vaccine as long as you are at least 3 months after your transplant/cellular therapy.  We do not recommend the vaccine sooner than 3 months as it is very unlikely that you will respond.        We continue to strongly recommended you avoid travel and crowds.  You should always wear a mask (N95) if you are around anyone outside of your own home, use good hand hygiene, and practice social distancing (>6 feet).  For the foreseeable future, these things should continue even after you are vaccinated. If you develop fever, cough, shortness of breath and/or have known exposure (close contact < 6 feet) with someone who has tested positive, please notify us immediately.          Click Here to Visit The [facility] Covid-19 Vaccine Hub  Get the latest facts on the COVID-19 vaccines.      Or visit [facility]???s COVID-19 Vaccine Hub at [email or web address] to review the latest facts about the vaccines.     -------------------------------------------------------------------------------  Return to clinic on Wednesday to see one of the providers. You will receive a time when you check out today.    Lab Results   Component Value Date    WBC 0.7 (L) 03/26/2020    HGB 10.0 (L) 03/26/2020    HCT 28.0 (L) 03/26/2020    PLT 13 (L) 03/26/2020     Lab Results   Component Value Date    NA 137 03/26/2020    K 4.1 03/26/2020    CL 107 03/26/2020    CO2 24.0 03/26/2020    BUN 26 (H) 03/26/2020  CREATININE 1.09 (H) 03/26/2020    GLU 137 03/26/2020    CALCIUM 9.4 03/26/2020    MG 2.0 03/26/2020    PHOS 3.6 03/01/2020     Lab Results   Component Value Date    BILITOT 0.8 03/26/2020    BILIDIR 0.30 03/01/2020    PROT 6.5 03/26/2020    ALBUMIN 4.3 03/26/2020    ALT 52 (H) 03/26/2020    AST 22 03/26/2020    ALKPHOS 147 (H) 03/26/2020    GGT 22 01/09/2020     Lab Results   Component Value Date    INR 1.62 03/01/2020    INR 1.61 03/01/2020    APTT 31.9 03/01/2020    APTT 32.7 03/01/2020       For prescription refills:   For refills, please check your medication bottles to see if you have additional refills left. If so, please call your pharmacy and follow the directions to request a refill. If you do not have any refills left, please make a request during your clinic visit or by submitting a request through MyChart or by calling [phone]. Please allow 24 hours if your request is made during the week or 48 hours if requests are made on the weekends or holidays.     --------------------------------------------------------------------------------------------------------------------  For appointments & questions Monday through Friday 8 AM-4:30 PM     Please call [phone] or Toll free [phone]    On Nights, Weekends and Holidays  Call [phone] and ask for the oncologist on call    Please visit PrivacyFever.cz, a resource created just for family members and caregivers.  This website lists support services, how and where to ask for help. It has tools to assist you as you help [place] care for your loved one.    [facility]  [address]  [place], [place] [address]  [email or web address]

## 2020-03-28 ENCOUNTER — Ambulatory Visit: Admit: 2020-03-28 | Discharge: 2020-03-29 | Payer: PRIVATE HEALTH INSURANCE

## 2020-03-28 DIAGNOSIS — D619 Aplastic anemia, unspecified: Secondary | ICD-10-CM | POA: Diagnosis not present

## 2020-03-28 DIAGNOSIS — D701 Agranulocytosis secondary to cancer chemotherapy: Secondary | ICD-10-CM | POA: Diagnosis not present

## 2020-03-28 DIAGNOSIS — T451X5D Adverse effect of antineoplastic and immunosuppressive drugs, subsequent encounter: Secondary | ICD-10-CM | POA: Diagnosis not present

## 2020-03-28 DIAGNOSIS — C9110 Chronic lymphocytic leukemia of B-cell type not having achieved remission: Secondary | ICD-10-CM | POA: Diagnosis not present

## 2020-03-28 DIAGNOSIS — Z7682 Awaiting organ transplant status: Secondary | ICD-10-CM | POA: Diagnosis not present

## 2020-03-28 LAB — CBC W/ AUTO DIFF
BASOPHILS ABSOLUTE COUNT: 0 10*9/L (ref 0.0–0.1)
BASOPHILS RELATIVE PERCENT: 0.4 %
EOSINOPHILS ABSOLUTE COUNT: 0 10*9/L (ref 0.0–0.4)
EOSINOPHILS RELATIVE PERCENT: 0 %
HEMATOCRIT: 24.2 % — ABNORMAL LOW (ref 36.0–46.0)
HEMOGLOBIN: 8.6 g/dL — ABNORMAL LOW (ref 12.0–16.0)
LARGE UNSTAINED CELLS: 4 % (ref 0–4)
LYMPHOCYTES ABSOLUTE COUNT: 0.2 10*9/L — ABNORMAL LOW (ref 1.5–5.0)
LYMPHOCYTES RELATIVE PERCENT: 23.5 %
MEAN CORPUSCULAR HEMOGLOBIN CONC: 35.6 g/dL (ref 31.0–37.0)
MEAN CORPUSCULAR HEMOGLOBIN: 28.8 pg (ref 26.0–34.0)
MEAN CORPUSCULAR VOLUME: 81 fL (ref 80.0–100.0)
MEAN PLATELET VOLUME: 9.2 fL (ref 7.0–10.0)
MONOCYTES ABSOLUTE COUNT: 0.1 10*9/L — ABNORMAL LOW (ref 0.2–0.8)
MONOCYTES RELATIVE PERCENT: 14 %
NEUTROPHILS ABSOLUTE COUNT: 0.5 10*9/L — ABNORMAL LOW (ref 2.0–7.5)
NEUTROPHILS RELATIVE PERCENT: 57.9 %
PLATELET COUNT: 6 10*9/L — CL (ref 150–440)
RED BLOOD CELL COUNT: 2.99 10*12/L — ABNORMAL LOW (ref 4.00–5.20)
RED CELL DISTRIBUTION WIDTH: 15.6 % — ABNORMAL HIGH (ref 12.0–15.0)
WBC ADJUSTED: 0.9 10*9/L — ABNORMAL LOW (ref 4.5–11.0)

## 2020-03-28 LAB — EBV VIRAL LOAD RESULT: Lab: NOT DETECTED

## 2020-03-28 LAB — PLATELET COUNT: Platelets:NCnc:Pt:Bld:Qn:Automated count: 26 — ABNORMAL LOW

## 2020-03-28 LAB — COMPREHENSIVE METABOLIC PANEL
ALKALINE PHOSPHATASE: 136 U/L — ABNORMAL HIGH (ref 46–116)
ALT (SGPT): 46 U/L (ref 10–49)
ANION GAP: 8 mmol/L (ref 5–14)
AST (SGOT): 23 U/L (ref <=34)
BILIRUBIN TOTAL: 0.7 mg/dL (ref 0.3–1.2)
BLOOD UREA NITROGEN: 26 mg/dL — ABNORMAL HIGH (ref 9–23)
BUN / CREAT RATIO: 21
CALCIUM: 9.1 mg/dL (ref 8.7–10.4)
CHLORIDE: 108 mmol/L — ABNORMAL HIGH (ref 98–107)
CO2: 22 mmol/L (ref 20.0–31.0)
CREATININE: 1.22 mg/dL — ABNORMAL HIGH
EGFR CKD-EPI AA FEMALE: 56 mL/min/{1.73_m2} — ABNORMAL LOW (ref >=60)
EGFR CKD-EPI NON-AA FEMALE: 48 mL/min/{1.73_m2} — ABNORMAL LOW (ref >=60)
GLUCOSE RANDOM: 126 mg/dL (ref 70–179)
POTASSIUM: 3.9 mmol/L (ref 3.4–4.5)
PROTEIN TOTAL: 6.2 g/dL (ref 5.7–8.2)
SODIUM: 138 mmol/L (ref 135–145)

## 2020-03-28 LAB — MAGNESIUM: Magnesium:MCnc:Pt:Ser/Plas:Qn:: 1.9

## 2020-03-28 LAB — SMEAR REVIEW

## 2020-03-28 LAB — BLOOD UREA NITROGEN: Urea nitrogen:MCnc:Pt:Ser/Plas:Qn:: 26 — ABNORMAL HIGH

## 2020-03-28 LAB — SLIDE REVIEW

## 2020-03-28 LAB — LARGE UNSTAINED CELLS: Lab: 4

## 2020-03-28 MED ADMIN — heparin, porcine (PF) 100 unit/mL injection 200 Units: 200 [IU] | INTRAVENOUS | @ 16:00:00 | Stop: 2020-03-29

## 2020-03-28 MED ADMIN — sodium chloride (NS) 0.9 % infusion: 50 mL/h | INTRAVENOUS | @ 13:00:00

## 2020-03-28 NOTE — Unmapped (Signed)
Pt is in clinic today for possible transfusion. Pt is in NAD, vss, ambulatory.   Labs collected from triple lumen [place], sent for analysis.   Hgb 8.6, Hematocrit 24.2, Platelet count 6.   Pt received transfusion 1 unit Platelets and 1 unit PRBC's.   Repeat platelet count is 26.   Line care provided, used lumens flushed and heparinized per protocol.   AVS provided, pt is discharged from clinic in NAD, in stable condition.

## 2020-03-28 NOTE — Unmapped (Signed)
Hospital Outpatient Visit on 03/28/2020   Component Date Value Ref Range Status   ??? ABO Grouping 03/28/2020 O NEG   Final   ??? Antibody Screen 03/28/2020 NEG   Final   ??? Sodium 03/28/2020 138  135 - 145 mmol/L Final   ??? Potassium 03/28/2020 3.9  3.4 - 4.5 mmol/L Final   ??? Chloride 03/28/2020 108* 98 - 107 mmol/L Final   ??? Anion Gap 03/28/2020 8  5 - 14 mmol/L Final   ??? CO2 03/28/2020 22.0  20.0 - 31.0 mmol/L Final   ??? BUN 03/28/2020 26* 9 - 23 mg/dL Final   ??? Creatinine 03/28/2020 1.22* 0.60 - 0.80 mg/dL Final   ??? BUN/Creatinine Ratio 03/28/2020 21   Final   ??? EGFR CKD-EPI Non-African American,* 03/28/2020 48* >=60 mL/min/1.29m2 Final   ??? EGFR CKD-EPI African American, Fem* 03/28/2020 56* >=60 mL/min/1.63m2 Final   ??? Glucose 03/28/2020 126  70 - 179 mg/dL Final   ??? Calcium 16/04/9603 9.1  8.7 - 10.4 mg/dL Final   ??? Albumin 54/03/8118 4.2  3.4 - 5.0 g/dL Final   ??? Total Protein 03/28/2020 6.2  5.7 - 8.2 g/dL Final   ??? Total Bilirubin 03/28/2020 0.7  0.3 - 1.2 mg/dL Final   ??? AST 14/78/2956 23  <=34 U/L Final   ??? ALT 03/28/2020 46  10 - 49 U/L Final   ??? Alkaline Phosphatase 03/28/2020 136* 46 - 116 U/L Final   ??? Magnesium 03/28/2020 1.9  1.6 - 2.6 mg/dL Final   ??? WBC 21/30/8657 0.9* 4.5 - 11.0 10*9/L Final   ??? RBC 03/28/2020 2.99* 4.00 - 5.20 10*12/L Final   ??? HGB 03/28/2020 8.6* 12.0 - 16.0 g/dL Final   ??? HCT 84/69/6295 24.2* 36.0 - 46.0 % Final   ??? MCV 03/28/2020 81.0  80.0 - 100.0 fL Final   ??? MCH 03/28/2020 28.8  26.0 - 34.0 pg Final   ??? MCHC 03/28/2020 35.6  31.0 - 37.0 g/dL Final   ??? RDW 28/41/3244 15.6* 12.0 - 15.0 % Final   ??? MPV 03/28/2020 9.2  7.0 - 10.0 fL Final   ??? Platelet 03/28/2020 6* 150 - 440 10*9/L Final   ??? Variable HGB Concentration 03/28/2020 Slight* Not Present Final   ??? Neutrophils % 03/28/2020 57.9  % Final   ??? Lymphocytes % 03/28/2020 23.5  % Final   ??? Monocytes % 03/28/2020 14.0  % Final   ??? Eosinophils % 03/28/2020 0.0  % Final   ??? Basophils % 03/28/2020 0.4  % Final   ??? Absolute Neutrophils 03/28/2020 0.5* 2.0 - 7.5 10*9/L Final   ??? Absolute Lymphocytes 03/28/2020 0.2* 1.5 - 5.0 10*9/L Final   ??? Absolute Monocytes 03/28/2020 0.1* 0.2 - 0.8 10*9/L Final   ??? Absolute Eosinophils 03/28/2020 0.0  0.0 - 0.4 10*9/L Final   ??? Absolute Basophils 03/28/2020 0.0  0.0 - 0.1 10*9/L Final   ??? Large Unstained Cells 03/28/2020 4  0 - 4 % Final   ??? Microcytosis 03/28/2020 Slight* Not Present Final   ??? Hyperchromasia 03/28/2020 Slight* Not Present Final   ??? Platelet 03/28/2020 26* 150 - 440 10*9/L Final   ??? Unit Blood Type 03/28/2020 O Neg   Final   ??? ISBT Number 03/28/2020 9500   Final   ??? Unit # 03/28/2020 [id number]   Final   ??? Status 03/28/2020 Issued   Final   ??? Product ID 03/28/2020 Platelets   Final   ??? PRODUCT CODE 03/28/2020 [id number]   Final   ???  Crossmatch 03/28/2020 Compatible   Final   ??? Unit Blood Type 03/28/2020 O Neg   Final   ??? ISBT Number 03/28/2020 9500   Final   ??? Unit # 03/28/2020 [id number]   Final   ??? Status 03/28/2020 Issued   Final   ??? Spec Expiration 03/28/2020 [id number]   Final   ??? Product ID 03/28/2020 Red Blood Cells   Final   ??? PRODUCT CODE 03/28/2020 [id number]   Final

## 2020-03-31 ENCOUNTER — Ambulatory Visit: Admit: 2020-03-31 | Discharge: 2020-04-01 | Payer: PRIVATE HEALTH INSURANCE

## 2020-03-31 ENCOUNTER — Other Ambulatory Visit: Admit: 2020-03-31 | Discharge: 2020-04-01 | Payer: PRIVATE HEALTH INSURANCE

## 2020-03-31 ENCOUNTER — Encounter: Admit: 2020-03-31 | Discharge: 2020-04-01 | Payer: PRIVATE HEALTH INSURANCE

## 2020-03-31 DIAGNOSIS — R161 Splenomegaly, not elsewhere classified: Secondary | ICD-10-CM | POA: Diagnosis not present

## 2020-03-31 DIAGNOSIS — C9110 Chronic lymphocytic leukemia of B-cell type not having achieved remission: Secondary | ICD-10-CM | POA: Diagnosis not present

## 2020-03-31 DIAGNOSIS — C9112 Chronic lymphocytic leukemia of B-cell type in relapse: Secondary | ICD-10-CM | POA: Diagnosis not present

## 2020-03-31 DIAGNOSIS — D701 Agranulocytosis secondary to cancer chemotherapy: Secondary | ICD-10-CM | POA: Diagnosis not present

## 2020-03-31 DIAGNOSIS — Z79899 Other long term (current) drug therapy: Secondary | ICD-10-CM | POA: Diagnosis not present

## 2020-03-31 DIAGNOSIS — E039 Hypothyroidism, unspecified: Secondary | ICD-10-CM | POA: Diagnosis not present

## 2020-03-31 DIAGNOSIS — Z9484 Stem cells transplant status: Secondary | ICD-10-CM | POA: Diagnosis not present

## 2020-03-31 DIAGNOSIS — Z79890 Hormone replacement therapy (postmenopausal): Secondary | ICD-10-CM | POA: Diagnosis not present

## 2020-03-31 DIAGNOSIS — D619 Aplastic anemia, unspecified: Secondary | ICD-10-CM | POA: Diagnosis not present

## 2020-03-31 DIAGNOSIS — R5383 Other fatigue: Secondary | ICD-10-CM | POA: Diagnosis not present

## 2020-03-31 DIAGNOSIS — F418 Other specified anxiety disorders: Secondary | ICD-10-CM | POA: Diagnosis not present

## 2020-03-31 DIAGNOSIS — D801 Nonfamilial hypogammaglobulinemia: Secondary | ICD-10-CM | POA: Diagnosis not present

## 2020-03-31 DIAGNOSIS — R197 Diarrhea, unspecified: Secondary | ICD-10-CM | POA: Diagnosis not present

## 2020-03-31 DIAGNOSIS — B259 Cytomegaloviral disease, unspecified: Secondary | ICD-10-CM | POA: Diagnosis not present

## 2020-03-31 DIAGNOSIS — T451X5A Adverse effect of antineoplastic and immunosuppressive drugs, initial encounter: Secondary | ICD-10-CM | POA: Diagnosis not present

## 2020-03-31 DIAGNOSIS — K5900 Constipation, unspecified: Secondary | ICD-10-CM | POA: Diagnosis not present

## 2020-03-31 DIAGNOSIS — D849 Immunodeficiency, unspecified: Secondary | ICD-10-CM | POA: Diagnosis not present

## 2020-03-31 DIAGNOSIS — G4700 Insomnia, unspecified: Secondary | ICD-10-CM | POA: Diagnosis not present

## 2020-03-31 DIAGNOSIS — D61810 Antineoplastic chemotherapy induced pancytopenia: Principal | ICD-10-CM

## 2020-03-31 LAB — COMPREHENSIVE METABOLIC PANEL
ALBUMIN: 4.1 g/dL (ref 3.4–5.0)
ALKALINE PHOSPHATASE: 135 U/L — ABNORMAL HIGH (ref 46–116)
ALT (SGPT): 61 U/L — ABNORMAL HIGH (ref 10–49)
ANION GAP: 7 mmol/L (ref 5–14)
BILIRUBIN TOTAL: 0.7 mg/dL (ref 0.3–1.2)
BLOOD UREA NITROGEN: 28 mg/dL — ABNORMAL HIGH (ref 9–23)
BUN / CREAT RATIO: 22
CALCIUM: 9.2 mg/dL (ref 8.7–10.4)
CHLORIDE: 111 mmol/L — ABNORMAL HIGH (ref 98–107)
CO2: 23 mmol/L (ref 20.0–31.0)
CREATININE: 1.3 mg/dL — ABNORMAL HIGH
EGFR CKD-EPI AA FEMALE: 52 mL/min/{1.73_m2} — ABNORMAL LOW (ref >=60)
EGFR CKD-EPI NON-AA FEMALE: 45 mL/min/{1.73_m2} — ABNORMAL LOW (ref >=60)
GLUCOSE RANDOM: 199 mg/dL — ABNORMAL HIGH (ref 70–179)
POTASSIUM: 3.8 mmol/L (ref 3.4–4.5)
PROTEIN TOTAL: 6.5 g/dL (ref 5.7–8.2)
SODIUM: 141 mmol/L (ref 135–145)

## 2020-03-31 LAB — CBC W/ AUTO DIFF
BASOPHILS ABSOLUTE COUNT: 0 10*9/L (ref 0.0–0.1)
BASOPHILS RELATIVE PERCENT: 0 %
EOSINOPHILS ABSOLUTE COUNT: 0 10*9/L (ref 0.0–0.4)
EOSINOPHILS RELATIVE PERCENT: 0 %
HEMATOCRIT: 26.5 % — ABNORMAL LOW (ref 36.0–46.0)
HEMOGLOBIN: 9.4 g/dL — ABNORMAL LOW (ref 12.0–16.0)
LYMPHOCYTES ABSOLUTE COUNT: 0.2 10*9/L — ABNORMAL LOW (ref 1.5–5.0)
LYMPHOCYTES RELATIVE PERCENT: 46.6 %
MEAN CORPUSCULAR HEMOGLOBIN CONC: 35.4 g/dL (ref 31.0–37.0)
MEAN CORPUSCULAR HEMOGLOBIN: 28 pg (ref 26.0–34.0)
MEAN CORPUSCULAR VOLUME: 79.1 fL — ABNORMAL LOW (ref 80.0–100.0)
MEAN PLATELET VOLUME: 7.4 fL (ref 7.0–10.0)
MONOCYTES ABSOLUTE COUNT: 0.1 10*9/L — ABNORMAL LOW (ref 0.2–0.8)
MONOCYTES RELATIVE PERCENT: 13.2 %
NEUTROPHILS RELATIVE PERCENT: 31.1 %
PLATELET COUNT: 12 10*9/L — ABNORMAL LOW (ref 150–440)
RED BLOOD CELL COUNT: 3.35 10*12/L — ABNORMAL LOW (ref 4.00–5.20)
RED CELL DISTRIBUTION WIDTH: 15.6 % — ABNORMAL HIGH (ref 12.0–15.0)

## 2020-03-31 LAB — MAGNESIUM: Magnesium:MCnc:Pt:Ser/Plas:Qn:: 1.8

## 2020-03-31 LAB — SMEAR REVIEW

## 2020-03-31 LAB — BLOOD UREA NITROGEN: Urea nitrogen:MCnc:Pt:Ser/Plas:Qn:: 28 — ABNORMAL HIGH

## 2020-03-31 LAB — TACROLIMUS BLOOD: Lab: 5.1

## 2020-03-31 LAB — NEUTROPHIL LEFT SHIFT

## 2020-03-31 MED ORDER — VALACYCLOVIR 500 MG TABLET
ORAL_TABLET | Freq: Every day | ORAL | 11 refills | 30 days | Status: CP
Start: 2020-03-31 — End: ?
  Filled 2020-04-02: qty 30, 30d supply, fill #0

## 2020-03-31 MED ADMIN — albuterol 2.5 mg /3 mL (0.083 %) nebulizer solution 2.5 mg: 2.5 mg | RESPIRATORY_TRACT | @ 17:00:00

## 2020-03-31 MED ADMIN — heparin, porcine (PF) 100 unit/mL injection 200 Units: 200 [IU] | INTRAVENOUS | @ 18:00:00 | Stop: 2020-04-01

## 2020-03-31 MED ADMIN — sodium chloride 0.9% (NS) bolus 1,000 mL: 1000 mL | INTRAVENOUS | @ 17:00:00 | Stop: 2020-03-31

## 2020-03-31 MED ADMIN — tbo-filgrastim (GRANIX) injection 300 mcg: 300 ug | SUBCUTANEOUS | @ 17:00:00 | Stop: 2020-03-31

## 2020-03-31 MED ADMIN — heparin, porcine (PF) 100 unit/mL injection 200 Units: 200 [IU] | INTRAVENOUS | @ 14:00:00 | Stop: 2020-03-31

## 2020-03-31 MED ADMIN — pentamidine (PENTAM) inhalation solution: 300 mg | RESPIRATORY_TRACT | @ 17:00:00 | Stop: 2020-03-31

## 2020-03-31 NOTE — Unmapped (Signed)
BMT-CT Clinic Follow-up    Patient Name: Susan Kane  MRN: [id number]  Encounter date: 03/31/2020    Referring Physician: Dr. Jillyn Hidden Nida Boatman) [name]  Primary Care Provider: Thora Lance, MD  BMT Attending MD: Dr. Oswald Hillock    Disease: CLL  Current disease status: SD (stable disease)  Type of Transplant: RIC MUD Allo  Graft Source: Cryopreserved PBSCs  Transplant Day: +77    Study Participant: BMT CTN 1704: Composite Health Assessment Risk Model for Older Adults: Applying Pre-transplant Comorbidity, Geriatric Assessment and Biomarkers to Predict Non-Relapse Mortality After Allogeneic Transplant (CHARM).  ??  HPI:  Susan Kane??is a 59 y.o.??woman??with a diagnosis of CLL??and aplastic anemia who is now s/p RIC MUD AlloSCT. Her transplant course was complicated by delayed engraftment. ??She was initially discharged from the hospital on 02/23/2020. However was re-admitted on 02/27/20 with a fever and need for workup and IV cefepime. Infectious workup was unrevealing, IV cefepime was stopped on 03/02/20, and she was once again discharged on 03/03/20.      Interval History:   Susan Kane is here for routine follow up after transplant. She is here with her husband. She is feeling well overall, but more fatigued. Oral intake has decreased. She is discouraged be poor blood counts.   Less active. Drinking less and eating less. Has had constipation and diarrhea interchangeably, seems induced by strong laxatives taken intermittently. We reduced the dose of Senna to one a day but that did not make much of a difference.  No skin rash, dry eye or dry mouth. No other new symptoms.     Oncology History Overview Note   CLL:    Summary as per Dr. Kalman Drape most recent note with minor edits/additions from my review of records    1.??CLL?????diagnosed in August 2010, flow cytometry consistent with CLL  ?? Enlarged left??inguinal lymph node January 2019,??small??neck/axillary nodes and palpable splenomegaly 09/11/2017  ?? CTs??on 09/17/2017-3 cm necrotic appearing lymph node in the left inguinal region, borderline enlarged pelvic/retroperitoneal, chest, and axillary nodes. ??Mild splenomegaly.  ?? Ultrasound-guided biopsy of the left inguinal lymph node 09/18/2017, slightly purulent fluid aspirated, core biopsy is consistent with an atypical lymphoid proliferation???extensive necrosis with surrounding epithelioid histiocytes, limited intact lymphoid tissue involved with CLL  ?? Incisional biopsy of a necrotic/purulent left inguinal lymph node on 10/01/2017???extensive necrosis with granulomatous inflammation, small amount of viable lymphoid tissue involved with CLL, AFB and fungal stains negative  ?? Peripheral blood FISH analysis 02/05/2018??? +deletion 13q14, no evidence of p53 (17p13) deletion, no evidence of 11q22??deletion  - normal karyotype (46,XX)  ?? Bone marrow biopsy 02/26/2018???hypercellular marrow with extensive involvement by CLL, lymphocytes represent??85% of all cells  ?? Ibrutinib initiated 04/03/2018  ?? Ibrutinib placed on hold 04/11/2018 due to onset of arthralgias  ?? Ibrutinib resumed 04/16/2018, discontinued 04/25/2018 secondary to severe arthralgias/arthritis  ?? Ibrutinib resumed at a dose of 140 mg daily 05/03/2018  ?? Ibrutinib dose adjusted to 140 mg alternating with 280 mg 06/25/2018  ?? 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 ??Augmentin prescribed.  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    Baseline [facility]x reviewed at [facility]  Final Diagnosis   (Outside Case #:  [id number], dated 02/26/2018)  Bone marrow, aspiration  and biopsy  -  Hypercellular bone marrow (80%) with extensive involvement by chronic lymphocytic leukemia (87% lymphocytes by manual aspirate differential)  (See Comment)  -  By outside report, cytogenetic results are normal     Karyotype: 61, XX and FISH with 13q del but no 11q or 17p del    Repeat BM bx: (done after being off ibrutinib x1 month)  80% involvement by CLL    08/13/18: Presents to [facility] to discuss plan of care     Chronic lymphocytic leukemia (CLL), B-cell (CMS-HCC)   07/12/2018 Initial Diagnosis    Chronic lymphocytic leukemia (CLL), B-cell (CMS-HCC)     08/26/2018 -  Chemotherapy    Acalabrutinib started (approximate date)     11/14/2018 -  Chemotherapy    Acalabrutinib discontinued due to progressive bone marrow failure     12/06/2018 -  Chemotherapy    Ritux x1 given due to CLL still making up majority of BM cellularity (though in the setting of near aplasia of rest of TLH)     03/19/2019 -  Chemotherapy    Equine ATG / CsA for aplastic anemia (suspect 2/t acalabrutinib)     Chronic lymphocytic leukemia of B-cell type not having achieved remission (CMS-HCC)   01/21/2019 Initial Diagnosis    Chronic lymphocytic leukemia of B-cell type not having achieved remission (CMS-HCC)     01/01/2020 -  Chemotherapy    BMT IP BENDAMUSTINE / FLUDARABINE / RITUXIMAB (OP RITUXIMAB Days -13 & -6) (MUD)  bendamustine 130 mg/m2 IV on days -5, -4, -3   fludarabine 30 mg/m2 IV on days -5, -4, -3   riTUXimab 375 mg/m2 IV on day -13 as a OP treatment day, then 1,000 mg/m2 on days -6 as a OP treatment day, +1, +8   tacrolimus, 0.045 mg/kg PO BID on days -3 and -2 then  0.03 mg/kg PO BID starting on day -1   methotrexate 5 mg/m2 IV on days +1, +3, +6, +11   tbo-filgrastim 300 mcg (<= 75 kg) / 480 mcg (> 75 kg) q24h starting on day +7        Patient Active Problem List   Diagnosis   ??? Chronic lymphocytic leukemia (CLL), B-cell (CMS-HCC)   ??? Hypogammaglobulinemia (CMS-HCC)   ??? Chronic lymphocytic leukemia of B-cell type not having achieved remission (CMS-HCC)   ??? Aplastic anemia (CMS-HCC)   ??? 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 Drug-induced nausea and vomiting   ??? Stem cell transplant candidate   ??? Chemotherapy induced neutropenia (CMS-HCC)   ??? History of allogeneic stem cell transplant (CMS-HCC)   ??? Osteopenia   ??? Neutropenia, drug-induced (CMS-HCC)   ??? Immunosuppressed status (CMS-HCC)     Review of Systems:  A full system review was performed and was negative except as noted in the above interval history.    Reviewed and updated past medical, surgical, social, and family history as appropriate.      No Known Allergies  Current Outpatient Medications   Medication Sig Dispense Refill   ??? calcium carbonate-vitamin D3 600 mg(1,500mg ) -800 unit Tab Take 1 tablet by mouth Two (2) times a day. (Patient not taking: Reported on 03/05/2020) 60 tablet 5   ??? eltrombopag (PROMACTA) 50 MG tablet Take 2 tablets (100 mg total) by mouth daily. Administer on an empty stomach, 1 hour before or 2 hours after a meal. 60 tablet 2   ??? famotidine (PEPCID) 20 MG tablet Take 1 tablet (20 mg total) by mouth two (  2) times a day as needed for heartburn. 60 tablet 0   ??? folic acid (FOLVITE) 1 MG tablet Take 1 tablet (1 mg total) by mouth at bedtime. 30 tablet 5   ??? levothyroxine (SYNTHROID) 100 MCG tablet Take 1 tablet (100 mcg total) by mouth daily. 30 tablet 5   ??? lithium (LITHOBID) 300 MG ER tablet Take 1 tablet (300 mg total) by mouth at bedtime. 30 tablet 5   ??? magnesium oxide-Mg AA chelate (MAGNESIUM, AMINO ACID CHELATE,) 133 mg Tab Take 1 tablet by mouth daily. 100 tablet 2   ??? ondansetron (ZOFRAN) 8 MG tablet Take 1 tablet (8 mg total) by mouth every twelve (12) hours as needed for nausea. 30 tablet 2   ??? polyethylene glycol (MIRALAX) 17 gram packet Take 17 g by mouth daily as needed (constipation).     ??? posaconazole (NOXAFIL) 100 mg TbEC delayed released tablet Take 3 tablets (300 mg total) by mouth daily. 90 tablet 1   ??? senna-docusate (SENNOSIDES-DOCUSATE SODIUM) 8.6-50 mg Take 1 tablet by mouth at bedtime.     ??? tacrolimus (PROGRAF) 0.5 MG capsule Take 3 capsules (1.5 mg total) by mouth two (2) times a day. 180 capsule 5   ??? tacrolimus-vehicle base no.238 0.1 % Crea      ??? TRINTELLIX 10 mg tablet Take 1 tablet (10 mg total) by mouth at bedtime. 30 tablet 5   ??? valGANciclovir (VALCYTE) 450 mg tablet Take 1 tablet (450 mg total) by mouth two (2) times a day. 120 tablet 0   ??? zolpidem (AMBIEN) 5 MG tablet Take 1 to 2 tablets (5 mg-10 mg) as needed for insomnia. 60 tablet 0     No current facility-administered medications for this visit.     Objective:  Vitals:    03/31/20 1033   BP: 105/73   Pulse: 88   Resp: 16   Temp: 36.1 ??C (97 ??F)   TempSrc: Temporal   SpO2: 100%   Weight: 53.5 kg (117 lb 14.4 oz)   Height: 165.1 cm (5' 5)     Physical Exam:  General appearance - Thin WF,  appears well, no distress noted but more pale and more fatigued.   Mental status - affect appropriate to mood  Eyes - conjunctivae clear, sclera anicteric   ENT - mucous membranes moist, no ulcerations noted or erythema, normal non-tender sinuses. Tongue is coated  Neck - supple, no palpable LAD  Chest - lungs are clear to auscultation bilaterally without wheezes/rales/rhonchi   Heart - regular rate and rhythm without murmur, rub, or gallop   Abdomen - soft, bowel sounds within normal limits, no masses, no tenderness, no hepatosplenomegaly   Neurological - motor and sensory grossly normal bilaterally  Musculoskeletal - no joint tenderness, deformity or swelling, no muscular tenderness noted  Extremities - no pedal edema noted  Skin - no skin breakdown noted, no rash dry  Venous access - line site is non-tender, no erythema/drainage    Karnofsky/Lansky Performance Status  80, Normal activity with effort; some signs or symptoms of disease (ECOG equivalent 1)    Test Results:   Reviewed in EPIC. Abnormal values discussed below.     Assessment/Plan:  Mrs. Yerian is 59 yo woman with CLL as well as AA/bone marrow failure.  ??  CLL: CT C/A/P on 10/10/19 revealed no evidence of LAD or organomegaly. Possible endometrial fluid or thickening in the fundus of the uterus.  10/07/19 BMBx: Scant bone marrow sampling (<5% cellular) essentially  devoid of hematopoietic elements.  - ??Persistent CLL representing 23% of marrow cellularity by flow cytometric analysis.  -  Corresponding CBC: WBC 1.0, ANC 0.1.  ????????  ??  12/26/19: BmBx:   - ??Hypocellular (20% overall) involved by chronic lymphocytic leukemia/small lymphocytic lymphoma, representing approximately 20% of marrow cells by PAX5 immunohistochemistry.  - Normal karyotype: 46,XX[20]    02/10/20: BmBx:  -Markedly hypocellular bone marrow (< 5%) with essentially absent hematopoiesis. Normal Recipient and Donor karyotype:  46,XX[2]//46,XY[7].     Next bone marrow biopsy scheduled on 9/29 unless we decide to do sooner.       BMT:??SD (stable disease)  HCT-CI (age adjusted) 5??(active CLL, psych, age adjusted)    Conditioning:  1. Rituximab 375 mg/m2 on day -13 and 1000 mg/m2 on D-6, +1, and +8  2. Bendamustine 130 mg/m2 IV daily over 10 minutes on D-5 through -3  3. Fludarabine 30 mg/m2 IV daily over 30 minutes on D-5 through -3  ??  Donor: 10/10, ABO identical; O - , CMV negative  Cell dose: 5.51x10^6 cells/kg    Engraftment:   - Granix D+12 through WBC recovery (ANC 1.0 x 2 days or 3.0 x 1 day)  - Viral studies showed weakly positive EBV, CMV, HHV-6, plan to monitor for now  - 02/10/20 D+30 BMBx: markedly hypocellular marrow (<5%) with essentially absent hematopoiesis.     Chimerisms:     Ref. Range 02/09/2020 00:23 02/10/2020 14:45 03/08/2020 13:49 03/08/2020 13:49 8/23   Specimen Type Unknown Blood Bone Marrow Blood Blood    T-Cell (CD3) Recipient: Latest Units: %    >95 >95   T-Cell (CD3) Donor: Latest Units: %    <5 <5   Unfract. Recipient: Latest Units: % 37 17 14  15    Unfract. Donor: Latest Units: % 63 83 86  85     GvHD prophylaxis:   1. Tacrolimus goal 5-10, dose adjustments per BMT pharmacy.   ??** Current dose 1.5mg  BID  2. Completed Methotrexate 5 mg/m2 on D+1, +3, +6 and +11  3. rATG 1 mg/kg on D-2 and -1 included  ??  Heme:  Transfusion criteria: ABO identical donor [O-]   - Standard txf criteria: 1 unit of PRBCs for Hgb<7 and 1 unit of platelets for Plt <10K or bleeding.     ** No history of transfusion reactions.   - Granix for WBC of 1 or less [given today 9/3], tried [place] but did not get a lasting result, so now using Granix 300 mcg prn, 8/27, 8/30, 9/03 and today 9/08.   - Has been requiring PRBCs and platelets [last transfused plt on 03/28/20].   - Remains on Promacta 100 mg every day    - 03/31/20: ANC today 0.1. Rx a dose of Granix. Will follow.     ID:????  Prophylaxis:  - Antiviral: Valcyte as below for CMV viremia.   - Antifungal: Posaconazole 300 mg daily  - Antibacterial:??If ANC remains <0.5 on next lab check, will start Cefdinir.    - PJP: S/p pentamidine on 02/21/20, can replace with Bactrim once platelets engraft. For now will continue with monthly Pentamidine. Dose today 03/31/20.    CMV:  02/24/20: CMV 207  03/02/20: CMV 884. Valganciclovir started.    03/08/20: CMV decreased after 1 week of therapy to 140.  03/15/20: Repeat pending, if <50 can go to maintenance dosing.  03/17/20: Last CMV <50. Decreased valcyte to 450 mg BID.   ** Letermovir denied. Appeal in progress.  03/22/20: CMV Not detected. Maintenance dosing ends on 9/8  03/31/20: DC Valcyte. Restart Valtrex. CMV pending.   ??  HHV-6:   - Peaked on PCR at 41.2, now negative.   ??  EBV: negative,  - Weekly, pending 03/31/20   ??  GI:??  GERD Prophylaxis: Pepcid.  This is now PRN.   ??  Nausea:  - Zofran 8mg  prn [taking bid]    Coated Tongue:   - Nystatin solution PRN, improved.   ??  Renal:   - 03/31/20: Cr increased likely due to poor oral intake.   ??  FEN:   - Continue folic acid.    Hypomagnesemia:  - Mag Chelate 1 tablet BID, Mag level has been stable at 2.0, will reduce dose to 1 tablet daily, likely can stop if Mag remains normal.   - 03/31/20: Mg level 1.8.   ??  Endocrine:  Hypothyroid:??  - Continue Synthroid daily.   - TSH, T3, T4 - normal   ??  Hepatic:??  - VOD prophylaxis not warranted as non-myeloablative regimen.   ??  Hepatocellular liver injury pattern:  - Mild increase in AST, ALT, and AlkPhos, likely secondary to posaconazole. Stable today.   ??  Neuro/Pain:??  - No c/o at this time    Bone Health:   -03/05/20: DEXA scan results:  - spine L1 - L4 T score -1.4  - L proximal femur T score -1.9  - L femoral neck T score -1.8  Based on DEXA scan, patient has osteopenia of spine, femur and femoral neck. Recommend administering zoledronic acid 4 mg IV every 3 months x 2 doses in addition to calcium and vitamin D supplementation 600 mg/ 800 units twice daily. She can try replacing this with Vi active chews if she would like.   - Zometa given on 8/18, next dose due in November.  ??  Psych:??Has a home psychiatrist.   Depression/Anxiety:   - Per outpt note pt was taking Xanax 0.25 mg BID PRN for anxiety but this was not on her home med list thus I did not re-order at this time. Discussed with patient who stated she rarely takes.  - Lithium 300mg  nightly   03/15/20: Checking with home prescriber to see if they want [place] to check a level; [last 12/2019]   - Trintellix 10mg  nightly  ??  Insomnia:  - Ambien 10mg  nightly PRN  ??  Caregiver/Lodging:  - Husband [name] will be primary caregiver  - Staying at local rental.       Summary:  Granix given today, continue every other day. Will assess response to today's dose on Friday.   Pentamidine today.    Elevated cr - 1 L NS IV. Tac 5.1. Thus this is likely due to poor oral intake.   Return to clinic Mon-Wed-Fri. Shared visit with APPs next Wed.     Poor engraftment. Given no CD3 derived CD3+ cells, the CD34+ selected boost will likely not be effective. She will likely need another transplant using either AA conditioning regimen or graft failure regimen.  Checking on the cells we may have left from first collection. Follow closely.   If not enough, will consider another collection/another donor. But most likely another collection of the same donor.       Willaim Bane, MD  Bone Marrow Transplant and Cellular Therapy Program    I personally spent over 90  minutes face-to-face and non-face-to-face in the care of this patient, which includes all pre, intra,  and post visit time on the date of service.    Future Appointments   Date Time Provider Department Center   03/31/2020 12:00 PM ONCINF CHAIR 07 [facility]   04/02/2020 10:45 AM ONCBMT LABS [facility]   04/02/2020 11:15 AM ONCBMT APP B [facility]   04/02/2020 12:30 PM ONCINF CHAIR 02 [facility]   04/05/2020  9:45 AM ONCBMT LABS [facility]   04/05/2020 10:15 AM ONCBMT APP B [facility]   04/05/2020 11:30 AM ONCINF CHAIR 02 [facility]   04/07/2020 10:45 AM ONCBMT LABS [facility]   04/07/2020 11:15 AM ONCBMT APP B [facility]   04/07/2020 12:30 PM ONCINF CHAIR 03 [facility]   04/09/2020  9:45 AM ONCBMT LABS [facility]   04/09/2020 10:15 AM ONCBMT APP B [facility]   04/09/2020 11:30 AM ONCINF CHAIR 02 [facility]   04/12/2020 10:30 AM ONCBMT LABS [facility]   04/12/2020 11:00 AM ONCBMT APP A [facility]   04/12/2020 12:00 PM ONCINF CHAIR 02 [facility]   04/14/2020 10:30 AM ONCBMT LABS [facility]   04/14/2020 11:00 AM ONCBMT APP A [facility]   04/14/2020 12:00 PM ONCINF CHAIR 05 [facility]   04/16/2020 10:30 AM ONCBMT LABS [facility]   04/16/2020 11:00 AM ONCBMT APP A [facility]   04/21/2020 10:45 AM ONCBMT LABS [facility]   04/21/2020 11:15 AM ONCBMT APP B [facility]   04/21/2020 12:30 PM ONCBMT PROCEDURES [facility]   04/27/2020 10:15 AM ONCBMT LABS [facility]   04/27/2020 10:45 AM Artelia Laroche, MD [facility]   06/08/2020 11:00 AM ONCINF CHAIR 04 [facility]

## 2020-03-31 NOTE — Unmapped (Signed)
Encounter addended by: Meredith Mody, RN on: 03/31/2020 3:01 PM   Actions taken: Charge Capture section accepted

## 2020-03-31 NOTE — Unmapped (Signed)
Patient arrived to chair 7.  No complaints noted.  Access of CVC intact positive blood return.       Patient completed and tolerated treatment.  AVS declined and patient discharged to home.

## 2020-03-31 NOTE — Unmapped (Signed)
Lab on 03/31/2020   Component Date Value Ref Range Status   ??? Sodium 03/31/2020 141  135 - 145 mmol/L Final   ??? Potassium 03/31/2020 3.8  3.4 - 4.5 mmol/L Final   ??? Chloride 03/31/2020 111* 98 - 107 mmol/L Final   ??? Anion Gap 03/31/2020 7  5 - 14 mmol/L Final   ??? CO2 03/31/2020 23.0  20.0 - 31.0 mmol/L Final   ??? BUN 03/31/2020 28* 9 - 23 mg/dL Final   ??? Creatinine 03/31/2020 1.30* 0.60 - 0.80 mg/dL Final   ??? BUN/Creatinine Ratio 03/31/2020 22   Final   ??? EGFR CKD-EPI Non-African American,* 03/31/2020 45* >=60 mL/min/1.68m2 Final   ??? EGFR CKD-EPI African American, Fem* 03/31/2020 52* >=60 mL/min/1.83m2 Final   ??? Glucose 03/31/2020 199* 70 - 179 mg/dL Final   ??? Calcium 16/04/9603 9.2  8.7 - 10.4 mg/dL Final   ??? Albumin 54/03/8118 4.1  3.4 - 5.0 g/dL Final   ??? Total Protein 03/31/2020 6.5  5.7 - 8.2 g/dL Final   ??? Total Bilirubin 03/31/2020 0.7  0.3 - 1.2 mg/dL Final   ??? AST 14/78/2956 33  <=34 U/L Final   ??? ALT 03/31/2020 61* 10 - 49 U/L Final   ??? Alkaline Phosphatase 03/31/2020 135* 46 - 116 U/L Final   ??? Magnesium 03/31/2020 1.8  1.6 - 2.6 mg/dL Final   ??? WBC 21/30/8657 0.4* 4.5 - 11.0 10*9/L Final   ??? RBC 03/31/2020 3.35* 4.00 - 5.20 10*12/L Final   ??? HGB 03/31/2020 9.4* 12.0 - 16.0 g/dL Final   ??? HCT 84/69/6295 26.5* 36.0 - 46.0 % Final   ??? MCV 03/31/2020 79.1* 80.0 - 100.0 fL Final   ??? MCH 03/31/2020 28.0  26.0 - 34.0 pg Final   ??? MCHC 03/31/2020 35.4  31.0 - 37.0 g/dL Final   ??? RDW 28/41/3244 15.6* 12.0 - 15.0 % Final   ??? MPV 03/31/2020 7.4  7.0 - 10.0 fL Final   ??? Platelet 03/31/2020 12* 150 - 440 10*9/L Final   ??? Variable HGB Concentration 03/31/2020 Slight* Not Present Final   ??? Neutrophils % 03/31/2020 31.1  % Final   ??? Lymphocytes % 03/31/2020 46.6  % Final   ??? Monocytes % 03/31/2020 13.2  % Final   ??? Eosinophils % 03/31/2020 0.0  % Final   ??? Basophils % 03/31/2020 0.0  % Final   ??? Neutrophil Left Shift 03/31/2020 1+* Not Present Final   ??? Absolute Neutrophils 03/31/2020 0.1* 2.0 - 7.5 10*9/L Final   ??? Absolute Lymphocytes 03/31/2020 0.2* 1.5 - 5.0 10*9/L Final   ??? Absolute Monocytes 03/31/2020 0.1* 0.2 - 0.8 10*9/L Final   ??? Absolute Eosinophils 03/31/2020 0.0  0.0 - 0.4 10*9/L Final   ??? Absolute Basophils 03/31/2020 0.0  0.0 - 0.1 10*9/L Final   ??? Large Unstained Cells 03/31/2020 9* 0 - 4 % Final   ??? Microcytosis 03/31/2020 Slight* Not Present Final   ??? Hyperchromasia 03/31/2020 Slight* Not Present Final

## 2020-03-31 NOTE — Unmapped (Signed)
[facility] Refill Coordination Note    Specialty Medication(s) to be Shipped:   Transplant: tacrolimus 0.5mg  and General Specialty: PROMACTA    Other medication(s) to be shipped: No additional medications requested for fill at this time     Susan Kane, DOB: 04-Jan-1961  Phone: [phone] (home)       All above HIPAA information was verified with patient.     Was a Nurse, learning disability used for this call? No    Completed refill call assessment today to schedule patient's medication shipment from the [facility] [phone]).       Specialty medication(s) and dose(s) confirmed: Regimen is correct and unchanged.   Changes to medications: [name] reports no changes at this time.  Changes to insurance: No  Questions for the pharmacist: No    Confirmed patient received Welcome Packet with first shipment. The patient will receive a drug information handout for each medication shipped and additional FDA Medication Guides as required.       DISEASE/MEDICATION-SPECIFIC INFORMATION        N/A    SPECIALTY MEDICATION ADHERENCE     Medication Adherence    Patient reported X missed doses in the last month: 0  Specialty Medication: PROMACTA 50MG    Patient is on additional specialty medications: Yes  Additional Specialty Medications: TACROLIMUS 0.5 MG   Patient Reported Additional Medication X Missed Doses in the Last Month: 0  Patient is on more than two specialty medications: No  Informant: patient  Confirmed plan for next specialty medication refill: delivery by pharmacy  Refills needed for supportive medications: not needed          Refill Coordination    Has the Patients' Contact Information Changed: No  Is the Shipping Address Different: Yes  Updated Shipping Address: [address] [place]            PROMACTA 50 mg: 14 days of medicine on hand   TACROLIMUS 0.5 mg: 14 days of medicine on hand         SHIPPING     Shipping address confirmed in Epic.     Delivery Scheduled: Yes, Expected medication delivery date: 9/20.     Medication will be delivered via Same Day Courier to the temporary address in Epic WAM.    Jolene Schimke   [facility] Specialty Technician

## 2020-03-31 NOTE — Unmapped (Addendum)
Colace - can continue.  Try to take one Miralax a day - if your stools are too watery, can decrease to 1 Miralax every other day.  Drink at least 2 L of fluids a day.      Stop Valcyte. Restart Valtrex.

## 2020-03-31 NOTE — Unmapped (Incomplete)
Bone Marrow Transplant and Cellular Therapy Program  Immunosuppressive Therapy Note    Susan Kane is a 59 y.o. 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 Susan Kane is currently day +77.    Current dose: tacrolimus 1.5 mg PO BID    Goal tacrolimus Level: 5-10 ng/mL    Resulted level: 5.1 ng/mL    Lab Results   Component Value Date/Time    TACROLIMUS 5.1 03/31/2020 10:24 AM    TACROLIMUS 6.1 03/26/2020 08:11 AM    TACROLIMUS 4.5 (L) 03/24/2020 11:40 AM    TACROLIMUS 6.8 03/22/2020 11:23 AM    TACROLIMUS 4.6 03/19/2020 12:24 PM    TACROLIMUS 4.9 (L) 03/17/2020 11:18 AM    TACROLIMUS 5.7 03/15/2020 09:38 AM     Lab Results   Component Value Date/Time    CREATININE 1.30 (H) 03/31/2020 10:24 AM    CREATININE 1.22 (H) 03/28/2020 08:03 AM    CREATININE 1.09 (H) 03/26/2020 08:11 AM    CREATININE 1.01 (H) 03/24/2020 11:40 AM      Assessment/Recommendation: Tacrolimus level remains therapeutic. Her SCr has increased significantly since 9/5; however, given therapeutic trough, will continue on current regimen and closely follow renal function. Alk phos and ALT are mildly elevated but stable. AST and TBili are WNL. Given the above, plan to continue current tacrolimus dose of 1.5 mg PO BID and will recheck at next clinic visit to further assess trend and need for adjustment.     We will continue to monitor levels.  Patient will be followed for changes in renal and hepatic function, toxicity, and efficacy.     Sandre Kitty, PharmD  PGY-1 Pharmacy Resident

## 2020-04-01 LAB — EBV VIRAL LOAD RESULT: Lab: NOT DETECTED

## 2020-04-02 ENCOUNTER — Ambulatory Visit: Admit: 2020-04-02 | Discharge: 2020-04-03 | Payer: PRIVATE HEALTH INSURANCE

## 2020-04-02 ENCOUNTER — Encounter: Admit: 2020-04-02 | Discharge: 2020-04-03 | Payer: PRIVATE HEALTH INSURANCE

## 2020-04-02 DIAGNOSIS — F418 Other specified anxiety disorders: Secondary | ICD-10-CM | POA: Diagnosis not present

## 2020-04-02 DIAGNOSIS — G4700 Insomnia, unspecified: Secondary | ICD-10-CM | POA: Diagnosis not present

## 2020-04-02 DIAGNOSIS — Z79899 Other long term (current) drug therapy: Secondary | ICD-10-CM | POA: Diagnosis not present

## 2020-04-02 DIAGNOSIS — Z79890 Hormone replacement therapy (postmenopausal): Secondary | ICD-10-CM | POA: Diagnosis not present

## 2020-04-02 DIAGNOSIS — C9112 Chronic lymphocytic leukemia of B-cell type in relapse: Secondary | ICD-10-CM | POA: Diagnosis not present

## 2020-04-02 DIAGNOSIS — Z9484 Stem cells transplant status: Secondary | ICD-10-CM | POA: Diagnosis not present

## 2020-04-02 DIAGNOSIS — E039 Hypothyroidism, unspecified: Secondary | ICD-10-CM | POA: Diagnosis not present

## 2020-04-02 DIAGNOSIS — C9110 Chronic lymphocytic leukemia of B-cell type not having achieved remission: Principal | ICD-10-CM

## 2020-04-02 LAB — COMPREHENSIVE METABOLIC PANEL
ALBUMIN: 4.1 g/dL (ref 3.4–5.0)
ALKALINE PHOSPHATASE: 125 U/L — ABNORMAL HIGH (ref 46–116)
ALT (SGPT): 51 U/L — ABNORMAL HIGH (ref 10–49)
ANION GAP: 8 mmol/L (ref 5–14)
AST (SGOT): 22 U/L (ref <=34)
BILIRUBIN TOTAL: 0.9 mg/dL (ref 0.3–1.2)
BLOOD UREA NITROGEN: 20 mg/dL (ref 9–23)
BUN / CREAT RATIO: 18
CALCIUM: 9.6 mg/dL (ref 8.7–10.4)
CHLORIDE: 109 mmol/L — ABNORMAL HIGH (ref 98–107)
CO2: 22 mmol/L (ref 20.0–31.0)
EGFR CKD-EPI AA FEMALE: 63 mL/min/{1.73_m2} (ref >=60)
EGFR CKD-EPI NON-AA FEMALE: 55 mL/min/{1.73_m2} — ABNORMAL LOW (ref >=60)
POTASSIUM: 4 mmol/L (ref 3.4–4.5)
SODIUM: 139 mmol/L (ref 135–145)

## 2020-04-02 LAB — NEUTROPHILS ABSOLUTE COUNT: Neutrophils:NCnc:Pt:Bld:Qn:Automated count: 0.1 — CL

## 2020-04-02 LAB — CBC W/ AUTO DIFF
BASOPHILS ABSOLUTE COUNT: 0 10*9/L (ref 0.0–0.1)
BASOPHILS RELATIVE PERCENT: 0.3 %
EOSINOPHILS ABSOLUTE COUNT: 0 10*9/L (ref 0.0–0.4)
EOSINOPHILS RELATIVE PERCENT: 0 %
HEMATOCRIT: 24.3 % — ABNORMAL LOW (ref 36.0–46.0)
HEMOGLOBIN: 8.6 g/dL — ABNORMAL LOW (ref 12.0–16.0)
LARGE UNSTAINED CELLS: 5 % — ABNORMAL HIGH (ref 0–4)
LYMPHOCYTES ABSOLUTE COUNT: 0.2 10*9/L — ABNORMAL LOW (ref 1.5–5.0)
LYMPHOCYTES RELATIVE PERCENT: 47.5 %
MEAN CORPUSCULAR HEMOGLOBIN CONC: 35.4 g/dL (ref 31.0–37.0)
MEAN CORPUSCULAR HEMOGLOBIN: 28 pg (ref 26.0–34.0)
MEAN CORPUSCULAR VOLUME: 79.1 fL — ABNORMAL LOW (ref 80.0–100.0)
MEAN PLATELET VOLUME: 8.3 fL (ref 7.0–10.0)
MONOCYTES ABSOLUTE COUNT: 0.1 10*9/L — ABNORMAL LOW (ref 0.2–0.8)
MONOCYTES RELATIVE PERCENT: 21.4 %
NEUTROPHILS ABSOLUTE COUNT: 0.1 10*9/L — CL (ref 2.0–7.5)
NEUTROPHILS RELATIVE PERCENT: 25.5 %
PLATELET COUNT: 7 10*9/L — CL (ref 150–440)
RED BLOOD CELL COUNT: 3.07 10*12/L — ABNORMAL LOW (ref 4.00–5.20)
WBC ADJUSTED: 0.4 10*9/L — CL (ref 4.5–11.0)

## 2020-04-02 LAB — TACROLIMUS, TROUGH: Lab: 5.7

## 2020-04-02 LAB — PLATELET COUNT: Platelets:NCnc:Pt:Bld:Qn:Automated count: 22 — ABNORMAL LOW

## 2020-04-02 LAB — MAGNESIUM: Magnesium:MCnc:Pt:Ser/Plas:Qn:: 1.7

## 2020-04-02 LAB — LITHIUM LEVEL: Lithium:SCnc:Pt:Ser/Plas:Qn:: 0.8

## 2020-04-02 LAB — CMV COMMENT: Lab: 0

## 2020-04-02 LAB — CMV DNA, QUANTITATIVE, PCR

## 2020-04-02 LAB — ALKALINE PHOSPHATASE: Alkaline phosphatase:CCnc:Pt:Ser/Plas:Qn:: 125 — ABNORMAL HIGH

## 2020-04-02 LAB — LACTATE DEHYDROGENASE: Lactate dehydrogenase:CCnc:Pt:Ser/Plas:Qn:Reaction: pyruvate to lactate: 167

## 2020-04-02 LAB — SMEAR REVIEW

## 2020-04-02 MED ADMIN — heparin, porcine (PF) 100 unit/mL injection 200 Units: 200 [IU] | INTRAVENOUS | @ 15:00:00 | Stop: 2020-04-02

## 2020-04-02 MED ADMIN — tbo-filgrastim (GRANIX) injection 300 mcg: 300 ug | SUBCUTANEOUS | @ 19:00:00 | Stop: 2020-04-02

## 2020-04-02 MED ADMIN — heparin, porcine (PF) 100 unit/mL injection 200 Units: 200 [IU] | INTRAVENOUS | @ 19:00:00 | Stop: 2020-04-03

## 2020-04-02 MED FILL — ONDANSETRON HCL 8 MG TABLET: ORAL | 15 days supply | Qty: 30 | Fill #1

## 2020-04-02 MED FILL — VALACYCLOVIR 500 MG TABLET: 30 days supply | Qty: 30 | Fill #0 | Status: AC

## 2020-04-02 MED FILL — TRINTELLIX 10 MG TABLET: 30 days supply | Qty: 30 | Fill #1 | Status: AC

## 2020-04-02 MED FILL — TRINTELLIX 10 MG TABLET: ORAL | 30 days supply | Qty: 30 | Fill #1

## 2020-04-02 MED FILL — ONDANSETRON HCL 8 MG TABLET: 15 days supply | Qty: 30 | Fill #1 | Status: AC

## 2020-04-02 NOTE — Unmapped (Addendum)
BMT-CT Clinic Follow-up    Patient Name: Susan Kane  MRN: [id number]  Encounter date: 04/02/2020    Referring Physician: Dr. Jillyn Hidden Nida Boatman) [name]  Primary Care Provider: Thora Lance, MD  BMT Attending MD: Dr. Oswald Hillock    Disease: CLL  Current disease status: SD (stable disease)  Type of Transplant: RIC MUD Allo  Graft Source: Cryopreserved PBSCs  Transplant Day: +79    Study Participant: BMT CTN 1704: Composite Health Assessment Risk Model for Older Adults: Applying Pre-transplant Comorbidity, Geriatric Assessment and Biomarkers to Predict Non-Relapse Mortality After Allogeneic Transplant (CHARM).  ??  HPI:  Susan Kane??is a 59 y.o.??woman??with a diagnosis of CLL??and aplastic anemia who is now s/p RIC MUD AlloSCT. Her transplant course was complicated by delayed engraftment. ??She was initially discharged from the hospital on 02/23/2020. However was re-admitted on 02/27/20 with a fever and need for workup and IV cefepime. Infectious workup was unrevealing, IV cefepime was stopped on 03/02/20, and she was once again discharged on 03/03/20.      Interval History: Susan Kane is here for routine follow up after transplant. She met with Dr. Oswald Hillock earlier this week and they discussed potential options for graft failure treatment including selected CD34 boost and second transplant. Back-up options are still being secured.     She is fatigued. No fevers and no infectious signs or symptoms. No bleeding or new bruising. No rash. She does have very small amounts of loose stool sometimes when she is urinating along with some cramping first. This is unchanged. She has not taken further laxatives. She is not eating well.     Oncology History Overview Note   CLL:    Summary as per Dr. Kalman Drape most recent note with minor edits/additions from my review of records    1.??CLL?????diagnosed in August 2010, flow cytometry consistent with CLL  ?? Enlarged left??inguinal lymph node January 2019,??small??neck/axillary nodes and palpable splenomegaly 09/11/2017  ?? CTs??on 09/17/2017-3 cm necrotic appearing lymph node in the left inguinal region, borderline enlarged pelvic/retroperitoneal, chest, and axillary nodes. ??Mild splenomegaly.  ?? Ultrasound-guided biopsy of the left inguinal lymph node 09/18/2017, slightly purulent fluid aspirated, core biopsy is consistent with an atypical lymphoid proliferation???extensive necrosis with surrounding epithelioid histiocytes, limited intact lymphoid tissue involved with CLL  ?? Incisional biopsy of a necrotic/purulent left inguinal lymph node on 10/01/2017???extensive necrosis with granulomatous inflammation, small amount of viable lymphoid tissue involved with CLL, AFB and fungal stains negative  ?? Peripheral blood FISH analysis 02/05/2018??? +deletion 13q14, no evidence of p53 (17p13) deletion, no evidence of 11q22??deletion  - normal karyotype (46,XX)  ?? Bone marrow biopsy 02/26/2018???hypercellular marrow with extensive involvement by CLL, lymphocytes represent??85% of all cells  ?? Ibrutinib initiated 04/03/2018  ?? Ibrutinib placed on hold 04/11/2018 due to onset of arthralgias  ?? Ibrutinib resumed 04/16/2018, discontinued 04/25/2018 secondary to severe arthralgias/arthritis  ?? Ibrutinib resumed at a dose of 140 mg daily 05/03/2018  ?? Ibrutinib dose adjusted to 140 mg alternating with 280 mg 06/25/2018  ?? 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 ??Augmentin prescribed.  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    Baseline BM bx reviewed at [facility]  Final Diagnosis   (Outside Case #:  [id number], dated 02/26/2018)  Bone marrow, aspiration and biopsy  -  Hypercellular bone marrow (80%) with extensive involvement by chronic lymphocytic leukemia (87% lymphocytes by manual aspirate differential)  (See Comment)  -  By outside report, cytogenetic results are normal     Karyotype: 43, XX and FISH with 13q del but no 11q or 17p del    Repeat BM bx: (done after being off ibrutinib x1 month)  80% involvement by CLL    08/13/18: Presents to [facility] to discuss plan of care     Chronic lymphocytic leukemia (CLL), B-cell (CMS-HCC)   07/12/2018 Initial Diagnosis    Chronic lymphocytic leukemia (CLL), B-cell (CMS-HCC)     08/26/2018 -  Chemotherapy    Acalabrutinib started (approximate date)     11/14/2018 -  Chemotherapy    Acalabrutinib discontinued due to progressive bone marrow failure     12/06/2018 -  Chemotherapy    Ritux x1 given due to CLL still making up majority of BM cellularity (though in the setting of near aplasia of rest of TLH)     03/19/2019 -  Chemotherapy    Equine ATG / CsA for aplastic anemia (suspect 2/t acalabrutinib)     Chronic lymphocytic leukemia of B-cell type not having achieved remission (CMS-HCC)   01/21/2019 Initial Diagnosis    Chronic lymphocytic leukemia of B-cell type not having achieved remission (CMS-HCC)     01/01/2020 -  Chemotherapy    BMT IP BENDAMUSTINE / FLUDARABINE / RITUXIMAB (OP RITUXIMAB Days -13 & -6) (MUD)  bendamustine 130 mg/m2 IV on days -5, -4, -3   fludarabine 30 mg/m2 IV on days -5, -4, -3   riTUXimab 375 mg/m2 IV on day -13 as a OP treatment day, then 1,000 mg/m2 on days -6 as a OP treatment day, +1, +8   tacrolimus, 0.045 mg/kg PO BID on days -3 and -2 then  0.03 mg/kg PO BID starting on day -1   methotrexate 5 mg/m2 IV on days +1, +3, +6, +11   tbo-filgrastim 300 mcg (<= 75 kg) / 480 mcg (> 75 kg) q24h starting on day +7        Patient Active Problem List   Diagnosis   ??? Chronic lymphocytic leukemia (CLL), B-cell (CMS-HCC)   ??? Hypogammaglobulinemia (CMS-HCC)   ??? Chronic lymphocytic leukemia of B-cell type not having achieved remission (CMS-HCC)   ??? Aplastic anemia (CMS-HCC)   ??? 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 Drug-induced nausea and vomiting   ??? Stem cell transplant candidate   ??? Chemotherapy induced neutropenia (CMS-HCC)   ??? History of allogeneic stem cell transplant (CMS-HCC)   ??? Osteopenia   ??? Neutropenia, drug-induced (CMS-HCC)   ??? Immunosuppressed status (CMS-HCC)     Review of Systems:  A full system review was performed and was negative except as noted in the above interval history.    Reviewed and updated past medical, surgical, social, and family history as appropriate.      No Known Allergies  Current Outpatient Medications   Medication Sig Dispense Refill   ??? calcium carbonate-vitamin D3 600 mg(1,500mg ) -800 unit Tab Take 1 tablet by mouth Two (2) times a day. (Patient not taking: Reported on 03/05/2020) 60 tablet 5   ??? eltrombopag (PROMACTA) 50 MG tablet Take 2 tablets (100 mg total) by mouth daily. Administer on an empty stomach, 1 hour before or 2 hours after a meal. 60 tablet 2   ??? famotidine (PEPCID) 20 MG tablet Take 1 tablet (20 mg total)  by mouth two (2) times a day as needed for heartburn. 60 tablet 0   ??? folic acid (FOLVITE) 1 MG tablet Take 1 tablet (1 mg total) by mouth at bedtime. 30 tablet 5   ??? levothyroxine (SYNTHROID) 100 MCG tablet Take 1 tablet (100 mcg total) by mouth daily. 30 tablet 5   ??? lithium (LITHOBID) 300 MG ER tablet Take 1 tablet (300 mg total) by mouth at bedtime. 30 tablet 5   ??? magnesium oxide-Mg AA chelate (MAGNESIUM, AMINO ACID CHELATE,) 133 mg Tab Take 1 tablet by mouth daily. 100 tablet 2   ??? ondansetron (ZOFRAN) 8 MG tablet Take 1 tablet (8 mg total) by mouth every twelve (12) hours as needed for nausea. 30 tablet 2   ??? polyethylene glycol (MIRALAX) 17 gram packet Take 17 g by mouth daily as needed (constipation).     ??? posaconazole (NOXAFIL) 100 mg TbEC delayed released tablet Take 3 tablets (300 mg total) by mouth daily. 90 tablet 1   ??? tacrolimus (PROGRAF) 0.5 MG capsule Take 3 capsules (1.5 mg total) by mouth two (2) times a day. 180 capsule 5   ??? tacrolimus-vehicle base no.238 0.1 % Crea      ??? TRINTELLIX 10 mg tablet Take 1 tablet (10 mg total) by mouth at bedtime. 30 tablet 5   ??? valACYclovir (VALTREX) 500 MG tablet Take 1 tablet (500 mg total) by mouth daily. 30 tablet 11   ??? zolpidem (AMBIEN) 5 MG tablet Take 1 to 2 tablets (5 mg-10 mg) as needed for insomnia. 60 tablet 0     No current facility-administered medications for this visit.     Objective:  Vitals:    04/02/20 1100   BP: 118/71   Pulse: 94   Resp: 18   Temp: 36.6 ??C (97.9 ??F)   TempSrc: Temporal   SpO2: 100%   Weight: 54.4 kg (119 lb 14.4 oz)   Height: 165.1 cm (5' 5)     Physical Exam:  General appearance - Thin WF, pale coloring, no distress   Mental status - affect appropriate to situation   Eyes - conjunctivae clear, sclera anicteric   ENT - MMM, no oral ulcerations or thrush  Neck - supple, no palpable LAD  Chest - lungs are clear to auscultation bilaterally without wheezes or crackles  Heart - regular rate and rhythm without murmur, rub, or gallop   Abdomen - soft, bowel sounds within normal limits, no masses, no tenderness, no hepatosplenomegaly   Neurological - motor and sensory grossly normal bilaterally  Extremities - no pedal edema noted  Skin - no skin breakdown noted, no rash, petechiae noted on upper chest   Venous access - line site is non-tender, no erythema/drainage    Karnofsky/Lansky Performance Status  80, Normal activity with effort; some signs or symptoms of disease (ECOG equivalent 1)    Test Results:   Reviewed in EPIC. Abnormal values discussed below.     Assessment/Plan:  Mrs. Tedeschi is 59 yo woman with CLL as well as AA/bone marrow failure.  ??  CLL: CT C/A/P on 10/10/19 revealed no evidence of LAD or organomegaly. Possible endometrial fluid or thickening in the fundus of the uterus.  10/07/19 BMBx: Scant bone marrow sampling (<5% cellular) essentially devoid of hematopoietic elements.  - ??Persistent CLL representing 23% of marrow cellularity by flow cytometric analysis.  -  Corresponding CBC: WBC 1.0, ANC 0.1.  ????????  ??  12/26/19: BmBx:   - ??Hypocellular (20% overall) involved  by chronic lymphocytic leukemia/small lymphocytic lymphoma, representing approximately 20% of marrow cells by PAX5 immunohistochemistry.  - Normal karyotype: 46,XX[20]    02/10/20: BmBx:  -Markedly hypocellular bone marrow (< 5%) with essentially absent hematopoiesis. Normal Recipient and Donor karyotype:  46,XX[2]//46,XY[7].     Next bone marrow biopsy scheduled on 9/29 unless we decide to do sooner.       BMT:??SD (stable disease)  HCT-CI (age adjusted) 5??(active CLL, psych, age adjusted)    Conditioning:  1. Rituximab 375 mg/m2 on day -13 and 1000 mg/m2 on D-6, +1, and +8  2. Bendamustine 130 mg/m2 IV daily over 10 minutes on D-5 through -3  3. Fludarabine 30 mg/m2 IV daily over 30 minutes on D-5 through -3  ??  Donor: 10/10, ABO identical; O - , CMV negative  Cell dose: 5.51x10^6 cells/kg    Engraftment:   - Granix D+12 through WBC recovery (ANC 1.0 x 2 days or 3.0 x 1 day)  - Viral studies showed weakly positive EBV, CMV, HHV-6, plan to monitor for now  - 02/10/20 D+30 BMBx: markedly hypocellular marrow (<5%) with essentially absent hematopoiesis.     Chimerisms:     Ref. Range 02/09/2020 00:23 02/10/2020 14:45 03/08/2020 13:49 03/08/2020 13:49 8/23   Specimen Type Unknown Blood Bone Marrow Blood Blood    T-Cell (CD3) Recipient: Latest Units: %    >95 >95   T-Cell (CD3) Donor: Latest Units: %    <5 <5   Unfract. Recipient: Latest Units: % 37 17 14  15    Unfract. Donor: Latest Units: % 63 83 86  85     GvHD prophylaxis:   1. Tacrolimus goal 5-10, dose adjustments per BMT pharmacy.   ??** Current dose 1.5mg  BID  2. Completed Methotrexate 5 mg/m2 on D+1, +3, +6 and +11  3. rATG 1 mg/kg on D-2 and -1 included  ??  Heme:  Transfusion criteria: ABO identical donor [O-]   - Standard txf criteria: 1 unit of PRBCs for Hgb<7 and 1 unit of platelets for Plt <10K or bleeding.     ** No history of transfusion reactions.   - Granix for ANC of 1 or less [tried Udenyca but did not get a lasting result]    ** Given 8/27, 8/30, 9/3, 9/8, 04/02/20.   - Has been requiring PRBCs and platelets weekly at least.  - Remains on Promacta 100 mg every day.  04/02/20: Severe neutopenia with pancytopenia, Plts given for a count of 7, re-checked at 22k.     ID:????  Prophylaxis:  - Antiviral: Valtrex 500mg  daily.   - Antifungal: Posaconazole 300 mg daily  - Antibacterial:??If ANC remains <0.5 on next lab check, will start Cefdinir.    - PJP: Montly pentamidine [7/31, 03/31/20].    CMV:   03/02/20: CMV 884. Valganciclovir started.    03/08/20: CMV decreased after 1 week of therapy to 140.  03/15/20: Repeat pending, if <50 can go to maintenance dosing.  03/17/20: Last CMV <50. Decreased valcyte to 450 mg BID.   ** Letermovir denied. Appeal in progress.    03/22/20: CMV not detected. Maintenance dosing ends on 9/8  03/31/20: DC Valcyte. CMV negative.   ??  HHV-6:   - Peaked on PCR at 41.2, now negative.   ??  EBV:   - Weekly, negative 03/31/20   ??  GI:??  - No active issues other than decreased appetite.     ** Weight is stable.   ??  Nausea:  -  Zofran 8mg  prn [taking bid]  ??  Renal:   04/02/20: SCr increased improved.   ??  FEN:   - Continue folic acid.    Hypomagnesemia:  - Mag Chelate 1 tablet daily.   ??  Endocrine:  Hypothyroid:??  - Continue Synthroid daily.   - TSH, T3, T4 - normal   ??  Hepatic:??  - VOD prophylaxis not warranted as non-myeloablative regimen.   ??  Hepatocellular liver injury pattern:  - Mild increase in ALT, and AlkPhos, likely secondary to posaconazole.     ** Overall stable.   ??  Neuro/Pain:??  - No active issues.    Bone Health:   03/05/20: DEXA scan results:  - spine L1 - L4 T score -1.4  - L proximal femur T score -1.9  - L femoral neck T score -1.8  Based on DEXA scan, patient has osteopenia of spine, femur and femoral neck. Recommend administering zoledronic acid 4 mg IV every 3 months x 2 doses in addition to calcium and vitamin D supplementation 600 mg/ 800 units twice daily.   - Zometa given [8/18], final dose due in November.  ??  Psych:??Has a home psychiatrist. Alanson Aly [phone].   Depression/Anxiety:   - Per outpt note pt was taking Xanax 0.25 mg BID PRN for anxiety.  - Trintellix 10mg  nightly  - Lithium 300mg  nightly   04/02/20: Requested lithium level [last checked in June]     ** Pending, will call Dr. Jennelle Human when results.   ??  Insomnia:  - Ambien 10mg  nightly PRN  ??  Caregiver/Lodging:  - Spouse [name] is her primary caregiver  - Staying at [facility].     Summary:  - Granix today.   - Platelet infusion today.   - RTC M/W/F as scheduled for likely transfusions and Granix, shared with with Dr. Oswald Hillock next week to update on options.   - Start cefdinir next week if no response to granix.     Zacharia Sowles A. Marisa Hua, FNP-BC  Nurse Practitioner - Adult BMT    I personally spent over 50 minutes face-to-face and non-face-to-face in the care of this patient, which includes all pre, intra, and post visit time on the date of service.    Addended: Lithium level is normal at 0.8. Contacted pt to make her aware. Pt was unable to produce stool sample for C.diff testing. Will ctm.     Future Appointments   Date Time Provider Department Center   04/05/2020  9:45 AM ONCBMT LABS [facility]   04/05/2020 10:15 AM Doristine Locks, FNP [facility]   04/05/2020 11:30 AM ONCINF CHAIR 02 [facility]   04/07/2020 10:45 AM ONCBMT LABS [facility]   04/07/2020 11:15 AM ONCBMT APP B [facility]   04/07/2020 12:30 PM ONCINF CHAIR 03 [facility]   04/09/2020  9:45 AM ONCBMT LABS [facility]   04/09/2020 10:15 AM ONCBMT APP B [facility]   04/09/2020 11:30 AM ONCINF CHAIR 02 [facility]   04/12/2020 10:30 AM ONCBMT LABS [facility]   04/12/2020 11:00 AM ONCBMT APP A [facility]   04/12/2020 12:00 PM ONCINF CHAIR 02 [facility]   04/14/2020 10:30 AM ONCBMT LABS [facility]   04/14/2020 11:00 AM ONCBMT APP A [facility]   04/14/2020 12:00 PM ONCINF CHAIR 05 [facility]   04/16/2020 10:30 AM ONCBMT LABS [facility]  04/16/2020 11:00 AM ONCBMT APP A [facility]   04/21/2020 10:45 AM ONCBMT LABS [facility]   04/21/2020 11:15 AM ONCBMT APP B [facility]   04/21/2020 12:30 PM ONCBMT PROCEDURES [facility]   04/27/2020 10:15 AM ONCBMT LABS [facility]   04/27/2020 10:45 AM Artelia Laroche, MD [facility]   06/08/2020 11:00 AM ONCINF CHAIR 04 [facility]

## 2020-04-02 NOTE — Unmapped (Signed)
Bone Marrow Transplant and Cellular Therapy Program  Immunosuppressive Therapy Note    Susan Kane is a 59 y.o. 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 Susan Kane is currently day +79.    Current dose: tacrolimus 1.5 mg PO BID    Goal tacrolimus Level: 5-10 ng/mL    Resulted level: 5.7 ng/mL    Lab Results   Component Value Date/Time    TACROLIMUS 5.7 04/02/2020 10:41 AM    TACROLIMUS 5.1 03/31/2020 10:24 AM    TACROLIMUS 6.1 03/26/2020 08:11 AM    TACROLIMUS 4.5 (L) 03/24/2020 11:40 AM    TACROLIMUS 6.8 03/22/2020 11:23 AM    TACROLIMUS 4.6 03/19/2020 12:24 PM    TACROLIMUS 4.9 (L) 03/17/2020 11:18 AM     Lab Results   Component Value Date/Time    CREATININE 1.10 (H) 04/02/2020 10:41 AM    CREATININE 1.30 (H) 03/31/2020 10:24 AM    CREATININE 1.22 (H) 03/28/2020 08:03 AM    CREATININE 1.09 (H) 03/26/2020 08:11 AM      Assessment/Recommendation: Tacrolimus level remains therapeutic. Her SCr has increased significantly since 9/5; however, given therapeutic trough, will continue on current regimen and closely follow renal function. Alk phos and ALT are mildly elevated but stable. AST and TBili are WNL. Given the above, plan to continue current tacrolimus dose of 1.5 mg PO BID and will recheck at next clinic visit to further assess trend and need for adjustment.     We will continue to monitor levels.  Patient will be followed for changes in renal and hepatic function, toxicity, and efficacy.     Rulon Abide, PharmD, BCOP  Clinical Pharmacist Practitioner, BMT

## 2020-04-02 NOTE — Unmapped (Signed)
Patient tolerated transfusion of platelets x 1, subQ injection of Granix without complication. CVC hep locked and clamped; no sign of infiltration. No questions/concerns.

## 2020-04-05 ENCOUNTER — Ambulatory Visit: Admit: 2020-04-05 | Discharge: 2020-04-06 | Payer: PRIVATE HEALTH INSURANCE

## 2020-04-05 ENCOUNTER — Other Ambulatory Visit: Admit: 2020-04-05 | Discharge: 2020-04-06 | Payer: PRIVATE HEALTH INSURANCE

## 2020-04-05 ENCOUNTER — Encounter: Admit: 2020-04-05 | Discharge: 2020-04-06 | Payer: PRIVATE HEALTH INSURANCE

## 2020-04-05 ENCOUNTER — Telehealth: Payer: Self-pay | Admitting: Psychiatry

## 2020-04-05 DIAGNOSIS — T451X5A Adverse effect of antineoplastic and immunosuppressive drugs, initial encounter: Secondary | ICD-10-CM | POA: Diagnosis not present

## 2020-04-05 DIAGNOSIS — J189 Pneumonia, unspecified organism: Secondary | ICD-10-CM | POA: Diagnosis not present

## 2020-04-05 DIAGNOSIS — C9110 Chronic lymphocytic leukemia of B-cell type not having achieved remission: Secondary | ICD-10-CM | POA: Diagnosis not present

## 2020-04-05 DIAGNOSIS — R590 Localized enlarged lymph nodes: Secondary | ICD-10-CM | POA: Diagnosis not present

## 2020-04-05 DIAGNOSIS — M8580 Other specified disorders of bone density and structure, unspecified site: Secondary | ICD-10-CM | POA: Diagnosis not present

## 2020-04-05 DIAGNOSIS — Z9484 Stem cells transplant status: Secondary | ICD-10-CM | POA: Diagnosis not present

## 2020-04-05 DIAGNOSIS — Z9221 Personal history of antineoplastic chemotherapy: Secondary | ICD-10-CM | POA: Diagnosis not present

## 2020-04-05 DIAGNOSIS — E039 Hypothyroidism, unspecified: Secondary | ICD-10-CM | POA: Diagnosis not present

## 2020-04-05 DIAGNOSIS — D801 Nonfamilial hypogammaglobulinemia: Secondary | ICD-10-CM | POA: Diagnosis not present

## 2020-04-05 DIAGNOSIS — R161 Splenomegaly, not elsewhere classified: Secondary | ICD-10-CM | POA: Diagnosis not present

## 2020-04-05 DIAGNOSIS — Z7682 Awaiting organ transplant status: Secondary | ICD-10-CM | POA: Diagnosis not present

## 2020-04-05 DIAGNOSIS — B1910 Unspecified viral hepatitis B without hepatic coma: Secondary | ICD-10-CM | POA: Diagnosis not present

## 2020-04-05 DIAGNOSIS — D619 Aplastic anemia, unspecified: Secondary | ICD-10-CM | POA: Diagnosis not present

## 2020-04-05 DIAGNOSIS — C9112 Chronic lymphocytic leukemia of B-cell type in relapse: Secondary | ICD-10-CM | POA: Diagnosis not present

## 2020-04-05 DIAGNOSIS — D759 Disease of blood and blood-forming organs, unspecified: Secondary | ICD-10-CM | POA: Diagnosis not present

## 2020-04-05 DIAGNOSIS — R112 Nausea with vomiting, unspecified: Secondary | ICD-10-CM | POA: Diagnosis not present

## 2020-04-05 DIAGNOSIS — Z79890 Hormone replacement therapy (postmenopausal): Secondary | ICD-10-CM | POA: Diagnosis not present

## 2020-04-05 DIAGNOSIS — D849 Immunodeficiency, unspecified: Principal | ICD-10-CM

## 2020-04-05 DIAGNOSIS — D702 Other drug-induced agranulocytosis: Principal | ICD-10-CM

## 2020-04-05 DIAGNOSIS — D709 Neutropenia, unspecified: Principal | ICD-10-CM

## 2020-04-05 DIAGNOSIS — F325 Major depressive disorder, single episode, in full remission: Principal | ICD-10-CM

## 2020-04-05 DIAGNOSIS — D701 Agranulocytosis secondary to cancer chemotherapy: Secondary | ICD-10-CM

## 2020-04-05 DIAGNOSIS — T50905A Adverse effect of unspecified drugs, medicaments and biological substances, initial encounter: Principal | ICD-10-CM

## 2020-04-05 LAB — CBC W/ AUTO DIFF
BASOPHILS ABSOLUTE COUNT: 0 10*9/L (ref 0.0–0.1)
BASOPHILS RELATIVE PERCENT: 0.3 %
EOSINOPHILS ABSOLUTE COUNT: 0 10*9/L (ref 0.0–0.4)
EOSINOPHILS RELATIVE PERCENT: 0.7 %
HEMATOCRIT: 20.3 % — ABNORMAL LOW (ref 36.0–46.0)
HEMOGLOBIN: 7.3 g/dL — ABNORMAL LOW (ref 12.0–16.0)
LYMPHOCYTES ABSOLUTE COUNT: 0.2 10*9/L — ABNORMAL LOW (ref 1.5–5.0)
LYMPHOCYTES RELATIVE PERCENT: 51.4 %
MEAN CORPUSCULAR HEMOGLOBIN CONC: 35.7 g/dL (ref 31.0–37.0)
MEAN CORPUSCULAR HEMOGLOBIN: 28 pg (ref 26.0–34.0)
MEAN CORPUSCULAR VOLUME: 78.4 fL — ABNORMAL LOW (ref 80.0–100.0)
MEAN PLATELET VOLUME: 8.7 fL (ref 7.0–10.0)
MONOCYTES ABSOLUTE COUNT: 0.1 10*9/L — ABNORMAL LOW (ref 0.2–0.8)
NEUTROPHILS ABSOLUTE COUNT: 0.1 10*9/L — CL (ref 2.0–7.5)
NEUTROPHILS RELATIVE PERCENT: 10.7 %
PLATELET COUNT: 8 10*9/L — CL (ref 150–440)
RED BLOOD CELL COUNT: 2.59 10*12/L — ABNORMAL LOW (ref 4.00–5.20)
RED CELL DISTRIBUTION WIDTH: 15.1 % — ABNORMAL HIGH (ref 12.0–15.0)
WBC ADJUSTED: 0.5 10*9/L — ABNORMAL LOW (ref 4.5–11.0)

## 2020-04-05 LAB — COMPREHENSIVE METABOLIC PANEL
ALBUMIN: 3.9 g/dL (ref 3.4–5.0)
ALT (SGPT): 43 U/L (ref 10–49)
ANION GAP: 6 mmol/L (ref 5–14)
AST (SGOT): 16 U/L (ref <=34)
BLOOD UREA NITROGEN: 20 mg/dL (ref 9–23)
BUN / CREAT RATIO: 19
CALCIUM: 9.1 mg/dL (ref 8.7–10.4)
CHLORIDE: 108 mmol/L — ABNORMAL HIGH (ref 98–107)
CO2: 24 mmol/L (ref 20.0–31.0)
CREATININE: 1.07 mg/dL — ABNORMAL HIGH
EGFR CKD-EPI AA FEMALE: 65 mL/min/{1.73_m2} (ref >=60)
EGFR CKD-EPI NON-AA FEMALE: 57 mL/min/{1.73_m2} — ABNORMAL LOW (ref >=60)
GLUCOSE RANDOM: 155 mg/dL (ref 70–179)
POTASSIUM: 3.6 mmol/L (ref 3.4–4.5)
PROTEIN TOTAL: 6.2 g/dL (ref 5.7–8.2)
SODIUM: 138 mmol/L (ref 135–145)

## 2020-04-05 LAB — AST (SGOT): Aspartate aminotransferase:CCnc:Pt:Ser/Plas:Qn:: 16

## 2020-04-05 LAB — MEAN CORPUSCULAR HEMOGLOBIN CONC: Erythrocyte mean corpuscular hemoglobin concentration:MCnc:Pt:RBC:Qn:Automated count: 35.7

## 2020-04-05 LAB — SMEAR REVIEW

## 2020-04-05 LAB — TACROLIMUS, TROUGH: Lab: 3.5 — ABNORMAL LOW

## 2020-04-05 LAB — MAGNESIUM: Magnesium:MCnc:Pt:Ser/Plas:Qn:: 1.6

## 2020-04-05 LAB — PLATELET COUNT
Platelets:NCnc:Pt:Bld:Qn:Automated count: 28 — ABNORMAL LOW
Platelets:NCnc:Pt:Bld:Qn:Automated count: 7 — CL

## 2020-04-05 LAB — LACTATE DEHYDROGENASE: Lactate dehydrogenase:CCnc:Pt:Ser/Plas:Qn:Reaction: pyruvate to lactate: 154

## 2020-04-05 MED ORDER — TACROLIMUS 0.5 MG CAPSULE, IMMEDIATE-RELEASE
ORAL_CAPSULE | ORAL | 5 refills | 30 days
Start: 2020-04-05 — End: ?

## 2020-04-05 MED ORDER — CEFDINIR 300 MG CAPSULE
ORAL_CAPSULE | Freq: Two times a day (BID) | ORAL | 0 refills | 30 days
Start: 2020-04-05 — End: ?

## 2020-04-05 MED ADMIN — tbo-filgrastim (GRANIX) injection 300 mcg: 300 ug | SUBCUTANEOUS | @ 16:00:00 | Stop: 2020-04-05

## 2020-04-05 MED ADMIN — heparin, porcine (PF) 100 unit/mL injection 200 Units: 200 [IU] | INTRAVENOUS | @ 14:00:00 | Stop: 2020-04-05

## 2020-04-05 NOTE — Unmapped (Addendum)
Lab Results   Component Value Date    WBC 0.5 (L) 04/05/2020    HGB 7.3 (L) 04/05/2020    HCT 20.3 (L) 04/05/2020    PLT 8 (LL) 04/05/2020       Lab Results   Component Value Date    NA 138 04/05/2020    K 3.6 04/05/2020    CL 108 (H) 04/05/2020    CO2 24.0 04/05/2020    BUN 20 04/05/2020    CREATININE 1.07 (H) 04/05/2020    GLU 155 04/05/2020    CALCIUM 9.1 04/05/2020    MG 1.6 04/05/2020    PHOS 3.6 03/01/2020       Lab Results   Component Value Date    BILITOT 0.8 04/05/2020    BILIDIR 0.30 03/01/2020    PROT 6.2 04/05/2020    ALBUMIN 3.9 04/05/2020    ALT 43 04/05/2020    AST 16 04/05/2020    ALKPHOS 120 (H) 04/05/2020    GGT 22 01/09/2020       Lab Results   Component Value Date    PT 18.8 (H) 03/01/2020    PT 18.7 (H) 03/01/2020    INR 1.62 03/01/2020    INR 1.61 03/01/2020    APTT 31.9 03/01/2020    APTT 32.7 03/01/2020

## 2020-04-05 NOTE — Unmapped (Signed)
BMT-CT Clinic Follow-up    Patient Name: Susan Kane  MRN: [id number]  Encounter date: 04/05/2020    Referring Physician: Meredith Pel) [name], MD   Primary Care Provider: Thora Lance, MD  BMT Attending MD: Lanae Boast, MD    Disease: CLL  Current disease status: SD (stable disease)  Type of Transplant: RIC MUD Allo  Graft Source: Cryopreserved PBSCs  Transplant Day: +36    Study Participant: BMT CTN 1704: Composite Health Assessment Risk Model for Older Adults: Applying Pre-transplant Comorbidity, Geriatric Assessment and Biomarkers to Predict Non-Relapse Mortality After Allogeneic Transplant (CHARM).  ??  HPI:  Susan Kane??is a 59 y.o.??woman??with a diagnosis of CLL??and aplastic anemia who is now s/p RIC MUD AlloSCT. Her transplant course was complicated by delayed engraftment. ??She was initially discharged from the hospital on 02/23/2020. However was re-admitted on 02/27/20 with a fever and need for workup and IV cefepime. Infectious workup was unrevealing, IV cefepime was stopped on 03/02/20, and she was once again discharged on 03/03/20.      Interval History: Susan Kane is here for routine follow up after transplant. She is fatigued. No fevers and no infectious signs or symptoms. No bleeding or new bruising. No rash. She does have very small amounts of loose stool sometimes when she is urinating along with some cramping first. This is unchanged. She has not taken further laxatives. She is not eating well but maintaining weight.     Oncology History Overview Note   CLL:    Summary as per Dr. Kalman Drape most recent note with minor edits/additions from my review of records    1.??CLL?????diagnosed in August 2010, flow cytometry consistent with CLL  ?? Enlarged left??inguinal lymph node January 2019,??small??neck/axillary nodes and palpable splenomegaly 09/11/2017  ?? CTs??on 09/17/2017-3 cm necrotic appearing lymph node in the left inguinal region, borderline enlarged pelvic/retroperitoneal, chest, and axillary nodes. ??Mild splenomegaly.  ?? Ultrasound-guided biopsy of the left inguinal lymph node 09/18/2017, slightly purulent fluid aspirated, core biopsy is consistent with an atypical lymphoid proliferation???extensive necrosis with surrounding epithelioid histiocytes, limited intact lymphoid tissue involved with CLL  ?? Incisional biopsy of a necrotic/purulent left inguinal lymph node on 10/01/2017???extensive necrosis with granulomatous inflammation, small amount of viable lymphoid tissue involved with CLL, AFB and fungal stains negative  ?? Peripheral blood FISH analysis 02/05/2018??? +deletion 13q14, no evidence of p53 (17p13) deletion, no evidence of 11q22??deletion  - normal karyotype (46,XX)  ?? Bone marrow biopsy 02/26/2018???hypercellular marrow with extensive involvement by CLL, lymphocytes represent??85% of all cells  ?? Ibrutinib initiated 04/03/2018  ?? Ibrutinib placed on hold 04/11/2018 due to onset of arthralgias  ?? Ibrutinib resumed 04/16/2018, discontinued 04/25/2018 secondary to severe arthralgias/arthritis  ?? Ibrutinib resumed at a dose of 140 mg daily 05/03/2018  ?? Ibrutinib dose adjusted to 140 mg alternating with 280 mg 06/25/2018  ?? 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 ??Augmentin prescribed.  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    Baseline BM bx reviewed at [facility]  Final Diagnosis   (Outside Case #:  [id number], dated 02/26/2018)  Bone marrow, aspiration and biopsy  -  Hypercellular bone marrow (80%) with extensive involvement by chronic lymphocytic leukemia (87% lymphocytes by manual aspirate differential) (See Comment)  -  By outside report, cytogenetic results are normal     Karyotype: 43, XX and FISH with 13q del but no 11q or 17p del    Repeat BM bx: (done after being off ibrutinib x1 month)  80% involvement by CLL    08/13/18: Presents to [facility] to discuss plan of care     Chronic lymphocytic leukemia (CLL), B-cell (CMS-HCC)   07/12/2018 Initial Diagnosis    Chronic lymphocytic leukemia (CLL), B-cell (CMS-HCC)     08/26/2018 -  Chemotherapy    Acalabrutinib started (approximate date)     11/14/2018 -  Chemotherapy    Acalabrutinib discontinued due to progressive bone marrow failure     12/06/2018 -  Chemotherapy    Ritux x1 given due to CLL still making up majority of BM cellularity (though in the setting of near aplasia of rest of TLH)     03/19/2019 -  Chemotherapy    Equine ATG / CsA for aplastic anemia (suspect 2/t acalabrutinib)     Chronic lymphocytic leukemia of B-cell type not having achieved remission (CMS-HCC)   01/21/2019 Initial Diagnosis    Chronic lymphocytic leukemia of B-cell type not having achieved remission (CMS-HCC)     01/01/2020 -  Chemotherapy    BMT IP BENDAMUSTINE / FLUDARABINE / RITUXIMAB (OP RITUXIMAB Days -13 & -6) (MUD)  bendamustine 130 mg/m2 IV on days -5, -4, -3   fludarabine 30 mg/m2 IV on days -5, -4, -3   riTUXimab 375 mg/m2 IV on day -13 as a OP treatment day, then 1,000 mg/m2 on days -6 as a OP treatment day, +1, +8   tacrolimus, 0.045 mg/kg PO BID on days -3 and -2 then  0.03 mg/kg PO BID starting on day -1   methotrexate 5 mg/m2 IV on days +1, +3, +6, +11   tbo-filgrastim 300 mcg (<= 75 kg) / 480 mcg (> 75 kg) q24h starting on day +7        Patient Active Problem List   Diagnosis   ??? Chronic lymphocytic leukemia (CLL), B-cell (CMS-HCC)   ??? Hypogammaglobulinemia (CMS-HCC)   ??? Chronic lymphocytic leukemia of B-cell type not having achieved remission (CMS-HCC)   ??? Aplastic anemia (CMS-HCC)   ??? 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 Drug-induced nausea and vomiting   ??? Stem cell transplant candidate   ??? Chemotherapy induced neutropenia (CMS-HCC)   ??? History of allogeneic stem cell transplant (CMS-HCC)   ??? Osteopenia   ??? Neutropenia, drug-induced (CMS-HCC)   ??? Immunosuppressed status (CMS-HCC)     Review of Systems:  A full system review was performed and was negative except as noted in the above interval history.    Reviewed and updated past medical, surgical, social, and family history as appropriate.      No Known Allergies  Current Outpatient Medications   Medication Sig Dispense Refill   ??? calcium carbonate-vitamin D3 600 mg(1,500mg ) -800 unit Tab Take 1 tablet by mouth Two (2) times a day. (Patient not taking: Reported on 03/05/2020) 60 tablet 5   ??? eltrombopag (PROMACTA) 50 MG tablet Take 2 tablets (100 mg total) by mouth daily. Administer on an empty stomach, 1 hour before or 2 hours after a meal. 60 tablet 2   ??? famotidine (PEPCID) 20 MG tablet Take 1 tablet (20 mg total) by mouth two (2) times a day as needed for heartburn. 60 tablet 0   ??? folic acid (FOLVITE) 1 MG tablet Take 1 tablet (1 mg total) by mouth  at bedtime. 30 tablet 5   ??? levothyroxine (SYNTHROID) 100 MCG tablet Take 1 tablet (100 mcg total) by mouth daily. 30 tablet 5   ??? lithium (LITHOBID) 300 MG ER tablet Take 1 tablet (300 mg total) by mouth at bedtime. 30 tablet 5   ??? magnesium oxide-Mg AA chelate (MAGNESIUM, AMINO ACID CHELATE,) 133 mg Tab Take 1 tablet by mouth daily. 100 tablet 2   ??? ondansetron (ZOFRAN) 8 MG tablet Take 1 tablet (8 mg total) by mouth every twelve (12) hours as needed for nausea. 30 tablet 2   ??? polyethylene glycol (MIRALAX) 17 gram packet Take 17 g by mouth daily as needed (constipation).     ??? posaconazole (NOXAFIL) 100 mg TbEC delayed released tablet Take 3 tablets (300 mg total) by mouth daily. 90 tablet 1   ??? tacrolimus (PROGRAF) 0.5 MG capsule Take 3 capsules (1.5 mg total) by mouth two (2) times a day. 180 capsule 5   ??? tacrolimus-vehicle base no.238 0.1 % Crea      ??? TRINTELLIX 10 mg tablet Take 1 tablet (10 mg total) by mouth at bedtime. 30 tablet 5   ??? valACYclovir (VALTREX) 500 MG tablet Take 1 tablet (500 mg total) by mouth daily. 30 tablet 11   ??? zolpidem (AMBIEN) 5 MG tablet Take 1 to 2 tablets (5 mg-10 mg) as needed for insomnia. 60 tablet 0     No current facility-administered medications for this visit.     Objective:  Vitals:    04/05/20 1000   BP: 126/62   Pulse: 93   Resp: 16   TempSrc: Oral   SpO2: 100%   Weight: 54.3 kg (119 lb 11.4 oz)        Physical Exam: essentially unchanged from my exam Friday  General appearance - Thin WF, pale coloring, no distress   Mental status - affect appropriate to situation   Eyes - conjunctivae clear, sclera anicteric   ENT - MMM, no oral ulcerations or thrush  Neck - supple, no palpable LAD  Chest - lungs are clear to auscultation bilaterally without wheezes or crackles  Heart - regular rate and rhythm without murmur, rub, or gallop   Abdomen - soft, bowel sounds within normal limits, no masses, no tenderness, no hepatosplenomegaly   Neurological - motor and sensory grossly normal bilaterally  Extremities - no pedal edema noted  Skin - no skin breakdown noted, no rash, petechiae noted on upper chest   Venous access - line site is non-tender, no erythema/drainage    Karnofsky/Lansky Performance Status  80, Normal activity with effort; some signs or symptoms of disease (ECOG equivalent 1)    Test Results:   Reviewed in EPIC. Abnormal values discussed below.     Assessment/Plan:  Susan Kane is 59 yo woman with CLL as well as AA/bone marrow failure.  ??  CLL: CT C/A/P on 10/10/19 revealed no evidence of LAD or organomegaly. Possible endometrial fluid or thickening in the fundus of the uterus.  10/07/19 BMBx: Scant bone marrow sampling (<5% cellular) essentially devoid of hematopoietic elements.  - ??Persistent CLL representing 23% of marrow cellularity by flow cytometric analysis.  -  Corresponding CBC: WBC 1.0, ANC 0.1.  ????????  ??  12/26/19: BmBx:   - ??Hypocellular (20% overall) involved by chronic lymphocytic leukemia/small lymphocytic lymphoma, representing approximately 20% of marrow cells by PAX5 immunohistochemistry.  - Normal karyotype: 46,XX[20]    02/10/20: BmBx:  -Markedly hypocellular bone marrow (< 5%) with essentially absent hematopoiesis.  Normal Recipient and Donor karyotype:  46,XX[2]//46,XY[7].     Next bone marrow biopsy scheduled on 9/29 unless we decide to do sooner.       BMT:??SD (stable disease)  HCT-CI (age adjusted) 5??(active CLL, psych, age adjusted)    Conditioning:  1. Rituximab 375 mg/m2 on day -13 and 1000 mg/m2 on D-6, +1, and +8  2. Bendamustine 130 mg/m2 IV daily over 10 minutes on D-5 through -3  3. Fludarabine 30 mg/m2 IV daily over 30 minutes on D-5 through -3  ??  Donor: 10/10, ABO identical; O - , CMV negative  Cell dose: 5.51x10^6 cells/kg    Engraftment:   - Granix D+12 through WBC recovery (ANC 1.0 x 2 days or 3.0 x 1 day)  - Viral studies showed weakly positive EBV, CMV, HHV-6, plan to monitor for now  - 02/10/20 D+30 BMBx: markedly hypocellular marrow (<5%) with essentially absent hematopoiesis.     Chimerisms:     Ref. Range 02/09/2020 00:23 02/10/2020 14:45 03/08/2020 13:49 03/08/2020 13:49 8/23   Specimen Type Unknown Blood Bone Marrow Blood Blood    T-Cell (CD3) Recipient: Latest Units: %    >95 >95   T-Cell (CD3) Donor: Latest Units: %    <5 <5   Unfract. Recipient: Latest Units: % 37 17 14  15    Unfract. Donor: Latest Units: % 63 83 86  85     GvHD prophylaxis:   1. Tacrolimus goal 5-10, dose adjustments per BMT pharmacy.   ??** Current dose 1.5mg  BID  2. Completed Methotrexate 5 mg/m2 on D+1, +3, +6 and +11  3. rATG 1 mg/kg on D-2 and -1 included  ??  Heme:  Transfusion criteria: ABO identical donor [O-]   - Standard txf criteria: 1 unit of PRBCs for Hgb<7 and 1 unit of platelets for Plt <10K or bleeding.     ** No history of transfusion reactions.   - Granix for ANC of 1 or less [tried Udenyca but did not get a lasting result]    ** Given 8/27, 8/30, 9/3, 9/8, 9/10, 04/05/20   - Has been requiring PRBCs and platelets weekly at least.  - Remains on Promacta 100 mg every day.  04/02/20: Severe neutopenia with pancytopenia, Plts given for a count of 7, re-checked at 22k.     ID:????  Prophylaxis:  - Antiviral: Valtrex 500mg  daily.   - Antifungal: Posaconazole 300 mg daily  - Antibacterial:??Starting Cefdinir prophy [04/05/20]     - PJP: Montly pentamidine [7/31, 03/31/20].    CMV:   03/02/20: CMV 884. Valganciclovir started.    03/08/20: CMV decreased after 1 week of therapy to 140.  03/15/20: Repeat pending, if <50 can go to maintenance dosing.  03/17/20: Last CMV <50. Decreased valcyte to 450 mg BID.   ** Letermovir denied. Appeal in progress.    03/22/20: CMV not detected. Maintenance dosing ends on 9/8  03/31/20: DC Valcyte. CMV negative.   ??  HHV-6:   - Peaked on PCR at 41.2, now negative.   ??  EBV:   - Weekly, negative 03/31/20   ??  GI:??  - No active issues other than decreased appetite.     ** Weight is stable.   ??  Nausea:  - Zofran 8mg  prn [taking bid]  ??  Renal:   04/05/20: SCr increased improved.   ??  FEN:   - Continue folic acid.    Hypomagnesemia:  - Mag Chelate 1 tablet daily.   ??  Endocrine:  Hypothyroid:??  - Continue Synthroid daily.   - TSH, T3, T4 - normal   ??  Hepatic:??  - VOD prophylaxis not warranted as non-myeloablative regimen.   ??  Hepatocellular liver injury pattern:  - Mild increase in ALT, and AlkPhos, likely secondary to posaconazole.     ** Overall stable.   ??  Neuro/Pain:??  - No active issues.    Bone Health:   03/05/20: DEXA scan results:  - spine L1 - L4 T score -1.4  - L proximal femur T score -1.9  - L femoral neck T score -1.8  Based on DEXA scan, patient has osteopenia of spine, femur and femoral neck. Recommend administering zoledronic acid 4 mg IV every 3 months x 2 doses in addition to calcium and vitamin D supplementation 600 mg/ 800 units twice daily.   - Zometa given [8/18], final dose due in November.  ??  Psych:??Has a home psychiatrist. Alanson Aly [phone].   Depression/Anxiety:   - Per outpt note pt was taking Xanax 0.25 mg BID PRN for anxiety.  - Trintellix 10mg  nightly  - Lithium 300mg  nightly   04/02/20: Requested lithium level [last checked in June]     ** Dr. Jennelle Human was updated with results [04/05/20].    ??  Insomnia:  - Ambien 10mg  nightly PRN  ??  Caregiver/Lodging:  - Spouse [name] is her primary caregiver  - Staying at local rental.     Summary:  - Pancytopenia with neutropenia. Granix again today. Transfuse platelets, T&S sent in prep for likely blood transfusion Weds.   - RTC M/W/F as scheduled for likely transfusions and Granix, shared with Dr. Carron Curie to update on options.   - Starting prophylaxic cefdinir bid today.     Maurie Olesen A. Marisa Hua, FNP-BC  Nurse Practitioner - Adult BMT    I personally spent over 50 minutes face-to-face and non-face-to-face in the care of this patient, which includes all pre, intra, and post visit time on the date of service.    Addended:     Future Appointments   Date Time Provider Department Center   04/07/2020 10:45 AM ONCBMT LABS [facility]   04/07/2020 11:15 AM ONCBMT APP B [facility]   04/07/2020 12:30 PM ONCINF CHAIR 03 [id number]   04/09/2020  9:45 AM ONCBMT LABS [facility]   04/09/2020 10:15 AM ONCBMT APP B [facility]   04/09/2020 11:30 AM ONCINF CHAIR 02 [facility]   04/12/2020 10:30 AM ONCBMT LABS [facility]   04/12/2020 11:00 AM ONCBMT APP A [facility]   04/12/2020 12:00 PM ONCINF CHAIR 02 [id number]   04/14/2020 10:30 AM ONCBMT LABS [facility]   04/14/2020 11:00 AM ONCBMT APP A [facility]   04/14/2020 12:00 PM ONCINF CHAIR 05 [id number]   04/16/2020 10:30 AM ONCBMT LABS [facility]   04/16/2020 11:00 AM ONCBMT APP A [facility]   04/21/2020 10:45 AM ONCBMT LABS [facility]   04/21/2020 11:15 AM ONCBMT APP B [facility]   04/21/2020 12:30 PM ONCBMT PROCEDURES [id number]   04/27/2020 10:15 AM ONCBMT LABS [facility]   04/27/2020 10:45 AM Artelia Laroche, MD [facility]   06/08/2020 11:00 AM ONCINF CHAIR 04 [id number]

## 2020-04-05 NOTE — Unmapped (Signed)
Patient arrived to chair 2.  No complaints noted.  Access of CVC intact with blood return.     Patient meets parameters for plts and prbc.  Per Tomasita Crumble, NP, only give granix and plts today.

## 2020-04-05 NOTE — Unmapped (Signed)
Bone Marrow Transplant and Cellular Therapy Program  Immunosuppressive Therapy Note    Susan Kane is a 59 y.o. 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 Ms. Chavero is currently day +82.    Current dose: tacrolimus 1.5 mg PO BID    Goal tacrolimus Level: 5-10 ng/mL    Resulted level: 3.5 ng/mL @0956 , true trough likely >4    Lab Results   Component Value Date/Time    TACROLIMUS 3.5 (L) 04/05/2020 09:56 AM    TACROLIMUS 5.7 04/02/2020 10:41 AM    TACROLIMUS 5.1 03/31/2020 10:24 AM    TACROLIMUS 6.1 03/26/2020 08:11 AM    TACROLIMUS 4.5 (L) 03/24/2020 11:40 AM     Lab Results   Component Value Date/Time    CREATININE 1.07 (H) 04/05/2020 09:56 AM    CREATININE 1.10 (H) 04/02/2020 10:41 AM    CREATININE 1.30 (H) 03/31/2020 10:24 AM    CREATININE 1.22 (H) 03/28/2020 08:03 AM      Assessment/Recommendation: Tacrolimus level is subtherapeutic. Her SCr is decreasing back to baseline and Alk phos is mildly elevated but stable. AST, ALT and TBili are WNL. Given the above, plan to increase tacrolimus dose to 1.5 mg PO qAM and 2 mg qPM. Patient called and verbalized understanding of plan. and will recheck at next clinic visit to further assess trend and need for adjustment.     We will continue to monitor levels.  Patient will be followed for changes in renal and hepatic function, toxicity, and efficacy.     Sandre Kitty, PharmD  PGY-1 Pharmacy Resident     I have reviewed and agree with the resident plan.    Bettey Costa, PharmD, BCPS, BCOP  BMT Clinical Pharmacist Practitioner

## 2020-04-05 NOTE — Unmapped (Signed)
Patient arrived to chair 2.  No complaints noted.  Access of tunneled IJ intact w/ brisk blood return.   Patient completed and tolerated both platelet treatments. Pt did not want to stay for second post plat count.  AVS declined and patient discharged to home.

## 2020-04-05 NOTE — Telephone Encounter (Signed)
Noted lithium level 0.8.  no change indicated

## 2020-04-05 NOTE — Telephone Encounter (Signed)
Jeannette How, NP @ [facility] called to report Lithium Level for Pt was 0.8 normal. Just an FYI

## 2020-04-05 NOTE — Telephone Encounter (Signed)
FYI

## 2020-04-06 DIAGNOSIS — Z7682 Awaiting organ transplant status: Principal | ICD-10-CM

## 2020-04-06 NOTE — Unmapped (Signed)
error 

## 2020-04-07 ENCOUNTER — Encounter: Admit: 2020-04-07 | Discharge: 2020-04-08 | Payer: PRIVATE HEALTH INSURANCE

## 2020-04-07 ENCOUNTER — Ambulatory Visit: Admit: 2020-04-07 | Discharge: 2020-04-08 | Payer: PRIVATE HEALTH INSURANCE

## 2020-04-07 DIAGNOSIS — Z9481 Bone marrow transplant status: Secondary | ICD-10-CM | POA: Diagnosis not present

## 2020-04-07 DIAGNOSIS — D619 Aplastic anemia, unspecified: Secondary | ICD-10-CM | POA: Diagnosis not present

## 2020-04-07 DIAGNOSIS — F418 Other specified anxiety disorders: Secondary | ICD-10-CM | POA: Diagnosis not present

## 2020-04-07 DIAGNOSIS — C9110 Chronic lymphocytic leukemia of B-cell type not having achieved remission: Secondary | ICD-10-CM | POA: Diagnosis not present

## 2020-04-07 DIAGNOSIS — D61818 Other pancytopenia: Secondary | ICD-10-CM | POA: Diagnosis not present

## 2020-04-07 DIAGNOSIS — Z79891 Long term (current) use of opiate analgesic: Secondary | ICD-10-CM | POA: Diagnosis not present

## 2020-04-07 DIAGNOSIS — E039 Hypothyroidism, unspecified: Secondary | ICD-10-CM | POA: Diagnosis not present

## 2020-04-07 DIAGNOSIS — Z7682 Awaiting organ transplant status: Secondary | ICD-10-CM | POA: Diagnosis not present

## 2020-04-07 DIAGNOSIS — Z6820 Body mass index (BMI) 20.0-20.9, adult: Secondary | ICD-10-CM | POA: Diagnosis not present

## 2020-04-07 DIAGNOSIS — Z79890 Hormone replacement therapy (postmenopausal): Secondary | ICD-10-CM | POA: Diagnosis not present

## 2020-04-07 DIAGNOSIS — Z79899 Other long term (current) drug therapy: Secondary | ICD-10-CM | POA: Diagnosis not present

## 2020-04-07 DIAGNOSIS — C9111 Chronic lymphocytic leukemia of B-cell type in remission: Secondary | ICD-10-CM | POA: Diagnosis not present

## 2020-04-07 DIAGNOSIS — R110 Nausea: Secondary | ICD-10-CM | POA: Diagnosis not present

## 2020-04-07 DIAGNOSIS — Z9484 Stem cells transplant status: Secondary | ICD-10-CM | POA: Diagnosis not present

## 2020-04-07 DIAGNOSIS — G4700 Insomnia, unspecified: Secondary | ICD-10-CM | POA: Diagnosis not present

## 2020-04-07 LAB — COMPREHENSIVE METABOLIC PANEL
ALBUMIN: 3.9 g/dL (ref 3.4–5.0)
ALKALINE PHOSPHATASE: 120 U/L — ABNORMAL HIGH (ref 46–116)
ALT (SGPT): 44 U/L (ref 10–49)
ANION GAP: 8 mmol/L (ref 5–14)
AST (SGOT): 23 U/L (ref <=34)
BILIRUBIN TOTAL: 0.7 mg/dL (ref 0.3–1.2)
BLOOD UREA NITROGEN: 21 mg/dL (ref 9–23)
BUN / CREAT RATIO: 21
CALCIUM: 9.1 mg/dL (ref 8.7–10.4)
CHLORIDE: 106 mmol/L (ref 98–107)
CO2: 24 mmol/L (ref 20.0–31.0)
CREATININE: 1.01 mg/dL
EGFR CKD-EPI AA FEMALE: 70 mL/min/{1.73_m2} (ref >=60)
EGFR CKD-EPI NON-AA FEMALE: 61 mL/min/{1.73_m2} (ref >=60)
POTASSIUM: 3.9 mmol/L (ref 3.4–4.5)
PROTEIN TOTAL: 6.3 g/dL (ref 5.7–8.2)
SODIUM: 138 mmol/L (ref 135–145)

## 2020-04-07 LAB — EBV VIRAL LOAD RESULT: Lab: NOT DETECTED

## 2020-04-07 LAB — CBC W/ AUTO DIFF
BASOPHILS ABSOLUTE COUNT: 0 10*9/L (ref 0.0–0.1)
BASOPHILS RELATIVE PERCENT: 0.2 %
EOSINOPHILS ABSOLUTE COUNT: 0 10*9/L (ref 0.0–0.4)
EOSINOPHILS RELATIVE PERCENT: 0.2 %
HEMATOCRIT: 18.9 % — ABNORMAL LOW (ref 36.0–46.0)
HEMOGLOBIN: 6.5 g/dL — ABNORMAL LOW (ref 12.0–16.0)
LARGE UNSTAINED CELLS: 17 % — ABNORMAL HIGH (ref 0–4)
LYMPHOCYTES ABSOLUTE COUNT: 0.3 10*9/L — ABNORMAL LOW (ref 1.5–5.0)
LYMPHOCYTES RELATIVE PERCENT: 40.3 %
MEAN CORPUSCULAR HEMOGLOBIN: 27 pg (ref 26.0–34.0)
MEAN CORPUSCULAR VOLUME: 78.4 fL — ABNORMAL LOW (ref 80.0–100.0)
MEAN PLATELET VOLUME: 9.8 fL (ref 7.0–10.0)
MONOCYTES ABSOLUTE COUNT: 0.2 10*9/L (ref 0.2–0.8)
MONOCYTES RELATIVE PERCENT: 21.8 %
NEUTROPHILS ABSOLUTE COUNT: 0.1 10*9/L — CL (ref 2.0–7.5)
PLATELET COUNT: 21 10*9/L — ABNORMAL LOW (ref 150–440)
RED BLOOD CELL COUNT: 2.42 10*12/L — ABNORMAL LOW (ref 4.00–5.20)
RED CELL DISTRIBUTION WIDTH: 14.9 % (ref 12.0–15.0)
WBC ADJUSTED: 0.7 10*9/L — ABNORMAL LOW (ref 4.5–11.0)

## 2020-04-07 LAB — HERPES SIMPLEX VIRUS 2 IGG: Lab: NEGATIVE

## 2020-04-07 LAB — TOXOPLASMA GONDII IGG: Toxoplasma gondii Ab.IgG:PrThr:Pt:Ser:Ord:: NEGATIVE

## 2020-04-07 LAB — TOXIC GRANULATION

## 2020-04-07 LAB — LACTATE DEHYDROGENASE: Lactate dehydrogenase:CCnc:Pt:Ser/Plas:Qn:Reaction: pyruvate to lactate: 192

## 2020-04-07 LAB — CMV DNA, QUANTITATIVE, PCR

## 2020-04-07 LAB — TACROLIMUS, TROUGH: Lab: 6

## 2020-04-07 LAB — PROTIME-INR: PROTIME: 18 s — ABNORMAL HIGH (ref 10.3–13.4)

## 2020-04-07 LAB — EPSTEIN-BARR NUCLEAR ANTIGEN AB: Epstein Barr virus nuclear Ab.IgG:PrThr:Pt:Ser:Ord:: POSITIVE — AB

## 2020-04-07 LAB — EPSTEIN-BARR VIRUS ANTIBODY PANEL: EPSTEIN-BARR VCA IGG ANTIBODY: POSITIVE — AB

## 2020-04-07 LAB — CMV COMMENT: Lab: 0

## 2020-04-07 LAB — MONOCYTES ABSOLUTE COUNT: Monocytes:NCnc:Pt:Bld:Qn:Automated count: 0.2

## 2020-04-07 LAB — CMV IGG: Cytomegalovirus Ab.IgG:Imp:Pt:Ser/Plas:Nom:: POSITIVE — AB

## 2020-04-07 LAB — HEPARIN CORRELATION: Lab: 0.4

## 2020-04-07 LAB — ALBUMIN: Albumin:MCnc:Pt:Ser/Plas:Qn:: 3.9

## 2020-04-07 LAB — MAGNESIUM: Magnesium:MCnc:Pt:Ser/Plas:Qn:: 1.7

## 2020-04-07 LAB — INR: Coagulation tissue factor induced.INR:RelTime:Pt:PPP:Qn:Coag: 1.55

## 2020-04-07 LAB — APTT: APTT: 59.9 s — ABNORMAL HIGH (ref 24.9–36.9)

## 2020-04-07 MED ORDER — CEFDINIR 300 MG CAPSULE
ORAL_CAPSULE | Freq: Two times a day (BID) | ORAL | 0 refills | 15.00000 days
Start: 2020-04-07 — End: ?

## 2020-04-07 MED ORDER — MG-PLUS-PROTEIN 133 MG TABLET
ORAL_TABLET | Freq: Every day | ORAL | 2 refills | 100.00000 days
Start: 2020-04-07 — End: 2020-04-07

## 2020-04-07 MED ORDER — PHYTONADIONE (VITAMIN K1) 5 MG TABLET
ORAL_TABLET | Freq: Every day | ORAL | 0 refills | 3 days | Status: CP
Start: 2020-04-07 — End: ?
  Filled 2020-04-07: qty 3, 3d supply, fill #0

## 2020-04-07 MED ORDER — MG-PLUS-PROTEIN 133 MG TABLET: 1 | tablet | Freq: Two times a day (BID) | 2 refills | 50 days

## 2020-04-07 MED ADMIN — heparin, porcine (PF) 100 unit/mL injection 200 Units: 200 [IU] | INTRAVENOUS | @ 18:00:00 | Stop: 2020-04-08

## 2020-04-07 MED ADMIN — LORazepam (ATIVAN) injection 1.5 mg: 1.5 mg | INTRAVENOUS | @ 16:00:00 | Stop: 2020-04-07

## 2020-04-07 MED ADMIN — lidocaine (XYLOCAINE) 20 mg/mL (2 %) injection 10 mL: 10 mL | INTRADERMAL | @ 16:00:00 | Stop: 2020-04-07

## 2020-04-07 MED FILL — PHYTONADIONE (VITAMIN K1) 5 MG TABLET: 3 days supply | Qty: 3 | Fill #0 | Status: AC

## 2020-04-07 NOTE — Unmapped (Signed)
Patient Active Problem List   Diagnosis   ??? CLL       Clinician(s) Performing Procedure:  Westley Hummer ANP-BC  Supervising MD:  Dr. Armando Gang  Service Date :04/07/2020        Performing Service:MEDICINE::HEMATOLOGY/ONCOLOGY    Patient Location: Clinic         Patient   Name: Susan Kane   MRNO: [id number]   Age:59 y.o.           Indications   Indications : Post-transplant sample day +90     Procedure   Time Out: Performed immediately prior to the procedure       Name: Bone Marrow Aspiration and Biopsy      Description:   The procedure risks and alternatives of the procedure were explained to the patient.   Susan Kane was made aware that they are not to drive for the next 24 hrs if receiving any IV medications associated with this procedure. The patient verbalized understanding and signed informed consent. After a time-out in which his patient identifiers were checked by 2 providers, the patient was laid in prone position on the table.   The  posterior superior iliac spine and iliac crest were cleaned, prepped and draped in the usual sterile fashion.     Site: left posterior superior iliac spine.     Aseptic preparation, draping, and technique was used.     Anesthesia: 2% Lidocaine. Premedications: Ativan 1.5 mg IV    Needle : Rosenthal     Core Biopsy was obtained.     Aspirate was obtained from and adjacent site.     A pressure dressing was applied to the biopsy site and the patient was placed supine for 10 - 15 minutes to promote hemostasis.  Patient tolerated the procedure well.  Hemostasis was confirmed upon discharge.      Complications   None.     Specimen(s)   Bone marrow aspirate and core biopsy. Specimen was sent for routine histopathologic stains and sectioning, flow cytometry, cytogenetics and molecular analysis.         Additional post procedure instructions:  The patient was given verbal instructions for wound care, such as to keep the biopsy site dry, covered for 24 hours, and to call your physician with including but not limited to: bleeding from the site, redness and/or puss drainage for the site and fever.      The results will be reviewed with the patient when complete.          Westley Hummer ANP-BC, AOCNP  Bone Marrow Transplant Nurse Practitioner

## 2020-04-07 NOTE — Unmapped (Addendum)
Instructions:   - A BMT pharmacist will call you if I need to make changes to your tacrolimus dose.   - Begin Vitamin K to help improve blood clotting ability, this is one pill daily for 3 days  - Blood transfusion and bone marrow biopsy today  - We are working on retransplant plan and will be in touch about next steps    Return to clinic on Friday to see one of the providers.     Covid:   Given ongoing novel coronavirus (COVID-19), it is strongly recommended to avoid travel and crowds.  If you develop fever, cough, shortness of breath and/or have known exposure (close contact < 3 feet) with someone who has tested positive, please notify us immediately.    ??? Your best defense is to stay home as much as possible and use good hand hygiene.    For prescription refills:   For refills, please check your medication bottles to see if you have additional refills left. If so, please call your pharmacy and follow the directions to request a refill. If you do not have any refills left, please make a request during your clinic visit or by submitting a request through MyChart or by calling [phone]. Please allow 24 hours if your request is made during the week or 48 hours if requests are made on the weekends or holidays.     Labs:   Lab Results   Component Value Date    WBC 0.7 (L) 04/07/2020    HGB 6.5 (L) 04/07/2020    HCT 18.9 (L) 04/07/2020    PLT 21 (L) 04/07/2020     Lab Results   Component Value Date    NA 138 04/07/2020    K 3.9 04/07/2020    CL 106 04/07/2020    CO2 24.0 04/07/2020    BUN 21 04/07/2020    CREATININE 1.01 04/07/2020    GLU 158 04/07/2020    CALCIUM 9.1 04/07/2020    MG 1.7 04/07/2020    PHOS 3.6 03/01/2020     Lab Results   Component Value Date    BILITOT 0.7 04/07/2020    BILIDIR 0.30 03/01/2020    PROT 6.3 04/07/2020    ALBUMIN 3.9 04/07/2020    ALT 44 04/07/2020    AST 23 04/07/2020    ALKPHOS 120 (H) 04/07/2020    GGT 22 01/09/2020     Lab Results   Component Value Date    INR 1.55 04/07/2020 APTT 59.9 (H) 04/07/2020       --------------------------------------------------------------------------------------------------------------------  For appointments & questions Monday through Friday 8 AM-4:30 PM     Please call [phone] or Toll free [phone]    On Nights, Weekends and Holidays  Call [phone] and ask for the oncologist on call    Please visit PrivacyFever.cz, a resource created just for family members and caregivers.  This website lists support services, how and where to ask for help. It has tools to assist you as you help [place] care for your loved one.    [facility]  [address]  [place], [place] [address]  [email or web address]

## 2020-04-07 NOTE — Unmapped (Signed)
BMT-CT Clinic Follow-up    Referring Physician: Jillyn Hidden Nida Boatman) [name], MD   Primary Care Provider: Thora Lance, MD  BMT Attending MD: Lanae Boast, MD    Disease: CLL  Current disease status: SD (stable disease)  Type of Transplant: RIC MUD Allo  Graft Source: Cryopreserved PBSCs  Transplant Day: +84    Study Participant: BMT CTN 1704: Composite Health Assessment Risk Model for Older Adults: Applying Pre-transplant Comorbidity, Geriatric Assessment and Biomarkers to Predict Non-Relapse Mortality After Allogeneic Transplant (CHARM).  ??  HPI:  Susan Kane??is a 59 y.o.??woman??with a diagnosis of CLL??and aplastic anemia who is now s/p RIC MUD AlloSCT. Her transplant course was complicated by delayed engraftment. ??She was initially discharged from the hospital on 02/23/2020. However was re-admitted on 02/27/20 with a fever and need for workup and IV cefepime. Infectious workup was unrevealing, IV cefepime was stopped on 03/02/20, and she was once again discharged on 03/03/20.  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  Currently planning for second transplant.      Interval History: Susan Kane is here for follow up after transplant and to discuss plans for second transplant needed due to graft rejection and routine follow up.     Feeling fatigued, and needs blood today.  Eating and drinking but thinks not enough but working on it.  She is having diarrhea, but intermittently and not today, this is unchanged.  No rash or itching.  No bleeding or infectious symptoms.    Oncology History Overview Note   CLL:    Summary as per Dr. Kalman Drape most recent note with minor edits/additions from my review of records    1.??CLL?????diagnosed in August 2010, flow cytometry consistent with CLL  ?? Enlarged left??inguinal lymph node January 2019,??small??neck/axillary nodes and palpable splenomegaly 09/11/2017  ?? CTs??on 09/17/2017-3 cm necrotic appearing lymph node in the left inguinal region, borderline enlarged pelvic/retroperitoneal, chest, and axillary nodes. ??Mild splenomegaly.  ?? Ultrasound-guided biopsy of the left inguinal lymph node 09/18/2017, slightly purulent fluid aspirated, core biopsy is consistent with an atypical lymphoid proliferation???extensive necrosis with surrounding epithelioid histiocytes, limited intact lymphoid tissue involved with CLL  ?? Incisional biopsy of a necrotic/purulent left inguinal lymph node on 10/01/2017???extensive necrosis with granulomatous inflammation, small amount of viable lymphoid tissue involved with CLL, AFB and fungal stains negative  ?? Peripheral blood FISH analysis 02/05/2018??? +deletion 13q14, no evidence of p53 (17p13) deletion, no evidence of 11q22??deletion  - normal karyotype (46,XX)  ?? Bone marrow biopsy 02/26/2018???hypercellular marrow with extensive involvement by CLL, lymphocytes represent??85% of all cells  ?? Ibrutinib initiated 04/03/2018  ?? Ibrutinib placed on hold 04/11/2018 due to onset of arthralgias  ?? Ibrutinib resumed 04/16/2018, discontinued 04/25/2018 secondary to severe arthralgias/arthritis  ?? Ibrutinib resumed at a dose of 140 mg daily 05/03/2018  ?? Ibrutinib dose adjusted to 140 mg alternating with 280 mg 06/25/2018  ?? 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 ??Augmentin prescribed.  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    Baseline BM bx reviewed at [facility]  Final Diagnosis   (Outside Case #:  [id number], dated 02/26/2018)  Bone marrow, aspiration and biopsy  -  Hypercellular bone  marrow (80%) with extensive involvement by chronic lymphocytic leukemia (87% lymphocytes by manual aspirate differential)  (See Comment)  -  By outside report, cytogenetic results are normal     Karyotype: 41, XX and FISH with 13q del but no 11q or 17p del    Repeat BM bx: (done after being off ibrutinib x1 month)  80% involvement by CLL    08/13/18: Presents to [facility] to discuss plan of care     Chronic lymphocytic leukemia (CLL), B-cell (CMS-HCC)   07/12/2018 Initial Diagnosis    Chronic lymphocytic leukemia (CLL), B-cell (CMS-HCC)     08/26/2018 -  Chemotherapy    Acalabrutinib started (approximate date)     11/14/2018 -  Chemotherapy    Acalabrutinib discontinued due to progressive bone marrow failure     12/06/2018 -  Chemotherapy    Ritux x1 given due to CLL still making up majority of BM cellularity (though in the setting of near aplasia of rest of TLH)     03/19/2019 -  Chemotherapy    Equine ATG / CsA for aplastic anemia (suspect 2/t acalabrutinib)     Chronic lymphocytic leukemia of B-cell type not having achieved remission (CMS-HCC)   01/21/2019 Initial Diagnosis    Chronic lymphocytic leukemia of B-cell type not having achieved remission (CMS-HCC)     01/01/2020 -  Chemotherapy    BMT IP BENDAMUSTINE / FLUDARABINE / RITUXIMAB (OP RITUXIMAB Days -13 & -6) (MUD)  bendamustine 130 mg/m2 IV on days -5, -4, -3   fludarabine 30 mg/m2 IV on days -5, -4, -3   riTUXimab 375 mg/m2 IV on day -13 as a OP treatment day, then 1,000 mg/m2 on days -6 as a OP treatment day, +1, +8   tacrolimus, 0.045 mg/kg PO BID on days -3 and -2 then  0.03 mg/kg PO BID starting on day -1   methotrexate 5 mg/m2 IV on days +1, +3, +6, +11   tbo-filgrastim 300 mcg (<= 75 kg) / 480 mcg (> 75 kg) q24h starting on day +7        Patient Active Problem List   Diagnosis   ??? Chronic lymphocytic leukemia (CLL), B-cell (CMS-HCC)   ??? Hypogammaglobulinemia (CMS-HCC)   ??? Chronic lymphocytic leukemia of B-cell type not having achieved remission (CMS-HCC)   ??? Aplastic anemia (CMS-HCC)   ??? 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 Drug-induced nausea and vomiting   ??? Stem cell transplant candidate   ??? Chemotherapy induced neutropenia (CMS-HCC)   ??? History of allogeneic stem cell transplant (CMS-HCC)   ??? Osteopenia   ??? Neutropenia, drug-induced (CMS-HCC)   ??? Immunosuppressed status (CMS-HCC)   ??? Bone marrow hypocellularity   ??? Neutropenia (CMS-HCC)     Review of Systems:  A full system review was performed and was negative except as noted in the above interval history.    Reviewed and updated past medical, surgical, social, and family history as appropriate.      No Known Allergies  Current Outpatient Medications   Medication Sig Dispense Refill   ??? calcium carbonate-vitamin D3 600 mg(1,500mg ) -800 unit Tab Take 1 tablet by mouth Two (2) times a day. (Patient not taking: Reported on 03/05/2020) 60 tablet 5   ??? cefdinir (OMNICEF) 300 MG capsule Take 1 capsule (300 mg total) by mouth Two (2) times a day. 60 capsule 0   ??? eltrombopag (PROMACTA) 50 MG tablet Take 2 tablets (100 mg total) by mouth daily. Administer on an empty stomach, 1  hour before or 2 hours after a meal. 60 tablet 2   ??? famotidine (PEPCID) 20 MG tablet Take 1 tablet (20 mg total) by mouth two (2) times a day as needed for heartburn. 60 tablet 0   ??? folic acid (FOLVITE) 1 MG tablet Take 1 tablet (1 mg total) by mouth at bedtime. 30 tablet 5   ??? levothyroxine (SYNTHROID) 100 MCG tablet Take 1 tablet (100 mcg total) by mouth daily. 30 tablet 5   ??? lithium (LITHOBID) 300 MG ER tablet Take 1 tablet (300 mg total) by mouth at bedtime. 30 tablet 5   ??? magnesium oxide-Mg AA chelate (MAGNESIUM, AMINO ACID CHELATE,) 133 mg Tab Take 1 tablet by mouth daily. 100 tablet 2   ??? ondansetron (ZOFRAN) 8 MG tablet Take 1 tablet (8 mg total) by mouth every twelve (12) hours as needed for nausea. 30 tablet 2   ??? phytonadione, vitamin K1, (MEPHYTON) 5 mg tablet Take 1 tablet (5 mg total) by mouth daily. 3 tablet 0   ??? polyethylene glycol (MIRALAX) 17 gram packet Take 17 g by mouth daily as needed (constipation).     ??? posaconazole (NOXAFIL) 100 mg TbEC delayed released tablet Take 3 tablets (300 mg total) by mouth daily. 90 tablet 1   ??? tacrolimus (PROGRAF) 0.5 MG capsule Take 3 capsules (1.5 mg total) by mouth daily AND 4 capsules (2 mg total) nightly. 210 capsule 5   ??? tacrolimus-vehicle base no.238 0.1 % Crea      ??? TRINTELLIX 10 mg tablet Take 1 tablet (10 mg total) by mouth at bedtime. 30 tablet 5   ??? valACYclovir (VALTREX) 500 MG tablet Take 1 tablet (500 mg total) by mouth daily. 30 tablet 11   ??? zolpidem (AMBIEN) 5 MG tablet Take 1 to 2 tablets (5 mg-10 mg) as needed for insomnia. 60 tablet 0     No current facility-administered medications for this visit.     Facility-Administered Medications Ordered in Other Visits   Medication Dose Route Frequency Provider Last Rate Last Admin   ??? heparin, porcine (PF) 100 unit/mL injection 200 Units  200 Units Intravenous Q30 Min PRN Pernell Dupre, MD   200 Units at 04/07/20 1403   ??? sodium chloride (NS) 0.9 % infusion  50 mL/hr Intravenous Continuous Megan Royall McElfresh, PA         Objective:  Vitals:    04/07/20 1104   BP: 131/72   Pulse: 105   Resp: 16   Temp: 37.5 ??C (99.5 ??F)   TempSrc: Oral   SpO2: 99%   Weight: 55.5 kg (122 lb 4.8 oz)     Physical Exam: essentially unchanged from previously documented physical exam  General appearance - Thin WF, pale coloring, no distress   Mental status - affect appropriate to situation   Eyes - conjunctivae clear, sclera anicteric   ENT - MMM, no oral ulcerations or thrush  Neck - supple, no palpable LAD  Chest - lungs are clear to auscultation bilaterally without wheezes or crackles  Heart - regular rate and rhythm without murmur, rub, or gallop   Abdomen - soft, bowel sounds within normal limits, no masses, no tenderness, no hepatosplenomegaly   Neurological - motor and sensory grossly normal bilaterally  Extremities - no pedal edema noted  Skin - no skin breakdown noted, no rash, petechiae noted on upper chest   Venous access - line site is non-tender, no erythema/drainage    Karnofsky/Lansky Performance Status  80, Normal  activity with effort; some signs or symptoms of disease (ECOG equivalent 1)    Test Results:   Reviewed in EPIC. Abnormal values discussed below.     Assessment/Plan:  Susan Kane is 59 yo woman with CLL as well as AA/bone marrow failure.  ??  CLL: CT C/A/P on 10/10/19 revealed no evidence of LAD or organomegaly. Possible endometrial fluid or thickening in the fundus of the uterus.  10/07/19 BMBx: Scant bone marrow sampling (<5% cellular) essentially devoid of hematopoietic elements.  - ??Persistent CLL representing 23% of marrow cellularity by flow cytometric analysis.  -  Corresponding CBC: WBC 1.0, ANC 0.1.  ????????  ??  12/26/19: BmBx:   - ??Hypocellular (20% overall) involved by chronic lymphocytic leukemia/small lymphocytic lymphoma, representing approximately 20% of marrow cells by PAX5 immunohistochemistry.  - Normal karyotype: 46,XX[20]    02/10/20: BmBx:  -Markedly hypocellular bone marrow (< 5%) with essentially absent hematopoiesis. Normal Recipient and Donor karyotype:  46,XX[2]//46,XY[7].     Repeat bone marrow biopsy done today in preparation of next transplant, hopefully to be admitted possibly late next week.    BMT:??SD (stable disease)  HCT-CI (age adjusted) 4??(active CLL, psych, age adjusted)    Conditioning:  1. Rituximab 375 mg/m2 on day -13 and 1000 mg/m2 on D-6, +1, and +8  2. Bendamustine 130 mg/m2 IV daily over 10 minutes on D-5 through -3  3. Fludarabine 30 mg/m2 IV daily over 30 minutes on D-5 through -3  ??  Donor: 10/10, ABO identical; O - , CMV negative  Cell dose: 5.51x10^6 cells/kg    Engraftment:   - Granix D+12 through WBC recovery (ANC 1.0 x 2 days or 3.0 x 1 day)  - Viral studies showed weakly positive EBV, CMV, HHV-6, plan to monitor for now  - 02/10/20 D+30 BMBx: markedly hypocellular marrow (<5%) with essentially absent hematopoiesis.     Chimerisms:     Ref. Range 02/09/2020 00:23 02/10/2020 14:45 03/08/2020 13:49 03/08/2020 13:49 8/23 04/07/20   Specimen Type Unknown Blood Bone Marrow Blood Blood  Blood and marrow    T-Cell (CD3) Recipient: Latest Units: %    >95 >95 Pending   T-Cell (CD3) Donor: Latest Units: %    <5 <5    Unfract. Recipient: Latest Units: % 37 17 14  15     Unfract. Donor: Latest Units: % 63 83 86  85      GvHD prophylaxis:   1. Tacrolimus goal 5-10, dose adjustments per BMT pharmacy.   ??** Current dose 1.5mg  BID  2. Completed Methotrexate 5 mg/m2 on D+1, +3, +6 and +11  3. rATG 1 mg/kg on D-2 and -1 included  ??  Heme:  Transfusion criteria: ABO identical donor [O-]   - Standard txf criteria: 1 unit of PRBCs for Hgb<7 and 1 unit of platelets for Plt <10K or bleeding.     ** No history of transfusion reactions.   - Granix for ANC of 1 or less [tried Udenyca but did not get a lasting result]    ** Given 8/27, 8/30, 9/3, 9/8, 9/10, 04/05/20   - Has been requiring PRBCs and platelets weekly at least.  - Remains on Promacta 100 mg every day.    **Severe neutopenia with pancytopenia, Plts are 21K today but Hgb 6.5gm/dl and will get a unit of blood today**    Discussion today with Dr. Oswald Hillock about plan for second transplant.  Cells are being analyzed currently and would plan on more immunosuppressive regimen to avoid rejection  from aplastic process.   See Dr. Caralee Ates documentation for details.     ID:????  Prophylaxis:  - Antiviral: Valtrex 500mg  daily.   - Antifungal: Posaconazole 300 mg daily  - Antibacterial:??Starting Cefdinir prophy [04/05/20]     - PJP: Montly pentamidine [7/31, 03/31/20].    CMV:   03/02/20: CMV 884. Valganciclovir started.    03/08/20: CMV decreased after 1 week of therapy to 140.  03/17/20: Last CMV <50. Decreased valcyte to 450 mg BID.   ** Letermovir denied. Appeal in progress.    03/22/20: CMV not detected. Maintenance dosing ends on 9/8  03/31/20: DC Valcyte. CMV negative.   ??  HHV-6:   - Peaked on PCR at 41.2, now negative.   ??  EBV:   - Weekly, negative 03/31/20   ??  GI:??  - No active issues other than decreased appetite.     ** Weight is stable.   ??  Nausea:  - Zofran 8mg  prn [taking bid]  ??  Renal:   04/05/20: SCr increased improved.   ??  FEN:   - Continue folic acid.    Hypomagnesemia:  - Mag Chelate 1 tablet daily.   ??  Endocrine:  Hypothyroid:??  - Continue Synthroid daily.   - TSH, T3, T4 - normal   ??  Hepatic:??  - VOD prophylaxis not warranted as non-myeloablative regimen.   ??  Hepatocellular liver injury pattern:  - Mild increase in ALT, and AlkPhos, likely secondary to posaconazole.     ** Overall stable.   ??  Neuro/Pain:??  - No active issues.    Bone Health:   03/05/20: DEXA scan results:  - spine L1 - L4 T score -1.4  - L proximal femur T score -1.9  - L femoral neck T score -1.8  Based on DEXA scan, patient has osteopenia of spine, femur and femoral neck. Recommend administering zoledronic acid 4 mg IV every 3 months x 2 doses in addition to calcium and vitamin D supplementation 600 mg/ 800 units twice daily.   - Zometa given [8/18], final dose due in November.  ??  Psych:??Has a home psychiatrist. Alanson Aly [phone].   Depression/Anxiety:   - Per outpt note pt was taking Xanax 0.25 mg BID PRN for anxiety.  - Trintellix 10mg  nightly  - Lithium 300mg  nightly   04/02/20: Requested lithium level [last checked in June]     ** Dr. Jennelle Human was updated with results [04/05/20].    ??  Insomnia:  - Ambien 10mg  nightly PRN  ??  Caregiver/Lodging:  - Spouse [name] is her primary caregiver  - Staying at local rental.     Summary:  - Pancytopenia with neutropenia. Transfuse PRBCs today.    - RTC M/W/F as scheduled  - Begin Vitamin K    Susan Kane, [facility]  Physician Assistant  Adult Bone Marrow Transplantation    I personally spent over 60 minutes face-to-face and non-face-to-face in the care of this patient, which includes all pre, intra, and post visit time on the date of service. Patient was seen with Dr. Oswald Hillock to discuss plan for re-transplant.      Future Appointments   Date Time Provider Department Center   04/09/2020  9:45 AM ONCBMT LABS H[facility]   04/09/2020 10:15 AM ONCBMT APP B [facility]   04/09/2020 11:30 AM ONCINF CHAIR 02 [id number] [facility]   04/12/2020  8:15 AM [place] PFT 2 [id number] [facility]   04/12/2020  9:00  AM [facility] DIAG RM 11 IDUW [place]   04/12/2020 10:30 AM ONCBMT LABS HONCBMT [facility]   04/12/2020 11:00 AM ONCBMT APP A [facility]   04/12/2020 12:00 PM ONCINF CHAIR 02 [id number] [facility]   04/13/2020  2:00 PM CARS[facility] EKG [id number] [facility]   04/13/2020  2:30 PM [facility] ECHO OUTPATIENT 2 [id number]    04/14/2020 10:30 AM ONCBMT LABS HONCBMT [facility]   04/14/2020 11:00 AM ONCBMT APP A [facility]ON[facility]   04/14/2020 12:00 PM ONCINF CHAIR 05 [id number] [facility]   04/16/2020 10:30 AM ONCBMT LABS HONCBMT [facility]   04/16/2020 11:00 AM ONCBMT APP A [facility]ON[facility]   04/21/2020 10:45 AM ONCBMT LABS HON[facility]MT [facility]   04/21/2020 11:15 AM ONCBMT APP B [facility]ON[facility]   04/21/2020 12:30 PM ONCBMT PROCEDURES [id number] [facility]   04/27/2020 10:15 AM ONCBMT LABS HON[facility]MT [facility]   04/27/2020 10:45 AM Artelia Laroche, MD HONCBMT [facility]   06/08/2020 11:00 AM ONCINF CHAIR 04 [id number] [facility]

## 2020-04-07 NOTE — Unmapped (Signed)
Timeout completed at bedside. Patient correctly stated type of procedure.   Patient treatment visit uneventful, tolerated well. CVC lumen/s hep flushed and clamped prior to leaving. Patient left without questions/concerns.

## 2020-04-07 NOTE — Unmapped (Signed)
PATIENT NAME: Susan Kane  MR#: [id number]  DOB: 11-26-1960    [facility]      SOCIAL WORK  BONE MARROW TRANSPLANT ASSESSMENT       DATE OF EVALUATION:  April 07, 2020.  Social Worker, Juanell Fairly, completed the initial psychosocial assessment on July 29, 2019.  Please see the chart to review this previous assessment.     INFORMANTS: Imunique Samad, patient; information from [facility] medical records    Social work assessment update was conducted telephonically with [employer].       PRESENTING MEDICAL PROBLEMS AND RELEVANT HISTORY: Susan Kane is a 59 year old female-identified patient with a history of chronic lymphocytic leukemia first diagnosed 2010.  August 2020, Susan Kane received ATG and cyclosporine treatment for presumed severe aplastic anemia.  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  Susan Kane is now being considered for a second allogenic hematopoietic cell transplantation by Dr. Lanae Boast.      FUNCTIONAL STATUS: Susan Kane reports that she is independent in all activities of daily living and mobility. She denies any challenges with ambulation, dressing and bathing self.  At present, Susan Kane is not driving following recent transplant.  Additionally, Susan Kane endorses fatigue associated with recent transplant recovery and reduced food intake.  Susan Kane denies use of medical equipment at present. Susan Kane reports utilizing home health services 24 years ago following a C-section birth of her twin sons. Susan Kane appears well able to make medical decisions for herself.     UNDERSTANDING OF MEDICAL CONDITION AND TRANSPLANT: Susan Kane demonstrates a good understanding of her medical condition and the proposed allogeneic transplant process.  Social worker reviewed the logistical aspects of transplant including hospitalization, the requirements to reside locally for ~70 days post-transplant recovery with clinic follow-up, and caregiver expectations.  Susan Kane agrees to receive ongoing education about the transplant process by the transplant team.  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    ATTITUDE ABOUT TRANSPLANT:  Susan Kane demonstrates a good understanding of the potential benefits and risks of transplant. She states that a proposed second allogenic hematopoietic cell transplantation is ???not what I want to do,??? however, adds, ???if it???s what I need to do, I???ll do it.??? Susan Kane motivation and readiness to proceed with transplant.      LIVING SITUATION AND SUPPORTS:   Susan Kane resides at [address], [place] [address] in [place] with her husband Alean Rinne [phone]). Susan Kane and her husband own the 5 bedroom/5.5 bath 2-story residence. There are 2 steps into the home and no steps inside. Susan Kane and her husband???s bedroom is on the first floor. They have 2 dogs. Susan Kane reports no intention to get a new pet.  The social worker reviewed the guidelines following transplant regarding pets.    Susan Kane identifies receiving support from her husband Alean Rinne), girlfriends, family friends, and her 3 adult children Susan Kane + [place], [place] and Susan Kane).     CAREGIVER PLAN: Ms. Fuhrman identifies the following caregiver plan:     Primary caregiver: Susan Kane, husband [phone]).  Social worker spoke with [name] by phone on 04/07/20 and he reports willingness and availability to be present in [facility] with patient for pre-transplant work-up, procedures, stem cell collection, and post-transplant recovery.  [name] is aware of caregiver responsibilities and verbalizes agreement to do whatever is necessary to support his wife, and denies any barriers to fulfilling this role.  [name] has been Susan Kane???s primary caregiver following her initial allogenic hematopoietic cell transplantation in late June 2021.    Backup caregiver:  Susan Kane, daughter [phone]).    Social worker educated Susan Kane that verbal confirmation from backup caregiver will be needed.     TRAVEL TIME TO HOSPITAL/PLAN FOR POST-TRANSPLANT LODGING:  [name] resides 41 miles/1 hour and 11 minutes from the [facility].  Due to proximity to the [facility], Susan Kane will not be able to safety return home after discharge from the hospital.  Social worker educated Susan Kane on the need to reside locally in the [place] area for ~70 days post discharge with 2-3/weekly clinic follow up appointments. Social worker educated Susan Kane on local lodging options, including the [employer] and local hotels that accept a reduced medical rate.  Susan Kane reports that she and her husband/primary caregiver would plan to continue residing at the local rental they acquired through [employer] (where they have been residing following discharge in August 2021, after initial allogenic hematopoietic cell transplantation).    Susan Kane has a $10,000 travel and lodging benefit associated with her insurance and is already in the process of utilizing this benefit, and will continue to do so for proposed second allogenic hematopoietic cell transplantation.     COMPLIANCE HISTORY: Susan Kane reports she has been compliant with all prior treatment recommendations and follow-up appointments. Social worker educated about the importance of compliance throughout transplant process.     SUBSTANCE USE HISTORY: Susan Kane denies past and current use of tobacco. Susan Kane denies past and current use of illicit substances (including marijuana). Susan Kane reports history of social alcohol (wine) use. She denies any current use of alcohol.  Social worker educated Susan Kane on abstaining from alcohol use during transplant process, encouraging Ms. Herringshaw to check in with BMT medical team before resuming alcohol use post-transplant.  Ms. Amesquita denies any issues or concerns around not drinking during transplant process.    PSYCHIATRIC HISTORY: Ms. Renaldo???s scores on PHQ-9 (2/27) and GAD-7 (0/21) are indicative of minimal depression and minimal anxiety.  On the PHQ-9, Susan Kane reports several days of feeling tired/having little energy, as well as trouble concentrating on things.  She attributes both to her ongoing recovery from initial allogenic hematopoietic cell transplantation.      Ms. Theilen reports a history of anxiety and depression. She was in therapy pre- and post-divorce from her first husband. Ms. Quant stopped therapy at the end of 2021.  She is not currently in mental health treatment.  Ms. Eble reports seeing a psychiatrist yearly to refill her medication prescriptions, and reports that she maintains communication with her psychiatrist as needed.  She takes Lithium and Trintellix daily and Ambien weekly.  She used to take Percocet, however that has been discontinued. Ms. Deliz feels positive about her psychotropic medication regimen.  Ms. Taft denies any past or current suicidal ideation or homicidal ideation.        Social worker provided education on support services available through the [facility] ([facility]).  Ms. Weppler denies interest in a referral to [facility] at present, and social worker educated her that a referral can be made outpatient or inpatient. Social worker will support with referrals as needed.      COPING STYLE:  Ms. Rask reports coping with stress utilizing social/family support and engaging in mentally stimulating activities of interest.     ADVANCE DIRECTIVES:  Ms. Egley reports has a copy of her Advanced Directive on file at [facility].  Her husband, Alean Rinne ([phone]), and daughter, Amai Cappiello [phone]), are listed as her health care agents.    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 Ms. Kimberley???s husband, Alean Rinne, works as a Clinical research associate.      FINANCIAL RESOURCES: Ms. Chao and her husband, Alean Rinne, report that they are able to pay monthly bills and deny any outstanding bills.  When social worker inquired about financial need or financial stress, Susan Kane states that they are in a ???good??? financial state.  Ms. Kohlman denies any financial need or concern about the transplant process.  She plans to continue use of the $10,000 travel and lodging benefit through her health insurance.  Ms. Talford expresses intention to contact social work should financial stress arise.  Social worker will provide support and referrals as needed.       MEDICAL COVERAGE: Ms. Yankowski is insured by [employer].  She does not anticipate any changes in medical coverage throughout transplant/post-transplant recovery.  Ms. Hunt demonstrated a good understanding of the importance of maintain medical insurance throughout the transplant process.     ACCESS TO TRANSPORTATION:   Ms. Monaco reports she will have access to reliable transportation throughout transplant and post-transplant recovery.  Alean Rinne, her husband and primary caregiver, will transport her to pre- and post-transplant follow up appointments as needed.       RELIGIOUS AFFILIATION:  Ms. Mccumbers identifies as [facility].  Social worker provided education on pastoral care services available through [facility] (outpatient and inpatient).     HOME HEALTH AGENCY:   Ms. Hoelting reports using home health services 24 years ago following C-section for twin boys.  She is willing to work with the transplant case manager to arrange any needed post-acute services.    PLAN FOR TRANSPLANTATION: Ms. Modeste agrees to continue to discuss treatment planning in consultation with her physicians and plans to move forward with proposed second allogenic hematopoietic cell transplantation transplant.       ASSESSMENT: Ms. Vacca appears to have the internal and external resources to proceed with an allogenic stem cell transplant. She has a supportive husband, positive relationships with her adult children, a close group of friends, and financial resources.  No psychosocial concerns identified at time of assessment update.  Social worker will follow up and confirm caregiver plan. Ms. Greb denies any financial concern or barriers to the transplant process. Social worker will be available for support and referrals as needed.  Ms. Darden expressing readiness to move forward with transplant process.    EDUCATION/PLAN:  Child psychotherapist discussed the transplant process, provided education about caregiver expectations and related resources, including support services and housing options in the [place] area.  Social worker will confirm caregiver plan.  Social worker will be available as needed throughout the evaluation process and beyond, if Ms. Prestigiacomo is approved for a second allogeneic stem cell transplant.     TERS Scoring    Characteristic Weight Assessment value Total points   Prior Axis I disorder   4.0 2 - Previous Axis I disorder treated and now resolved 8   Prior Axis II disorder    4.0 1 - NONE 4   Substance Use/Abuse 3.0 1-  No history of heavy use/abuse of alcohol or drugs; or true social drinking; or limited drug experimentation 3   Compliance 3.0 1- Appropriately compliant throughout treatment 3   Health Behaviors 2.5 1- Practiced good health behaviors (exercise, no tobacco, diet, etc) prior to illness  2.5   Quality of Family/Social Support 2.5 1- Good-Excellent: friends/family members present and available; willing to focus on patient's needs 2.5   Prior history of Coping 2.5 1- Good-Excellent: Adapts to problems and changes flexibly; has extensive repertoire of coping behaviors 2.5   Coping with Disease and Treatment 2.5 1-Resolution of feelings about diagnosis; considers treatment options with realistic balance of hope and concern for future 2.5 Quality of Affect 1.5 1-Appropriate fears; some anxiety; appropriate sadness 1.5   Mental Status (past and present) 1.0 1-No cognitive impirment or disorder of attention; normal sleep-wake cycle; normal activity level and responsiveness 1               TOTAL SCORE:  30.5    ______________________________________________    Grace Isaac, LCSW  Bone Marrow Transplant Social Worker  [facility]  Phone Number: [phone]

## 2020-04-08 LAB — HEPATITIS C ANTIBODY: Hepatitis C virus Ab:PrThr:Pt:Ser:Ord:: NONREACTIVE

## 2020-04-08 LAB — SYPHILIS RPR SCREEN: Reagin Ab:PrThr:Pt:Ser:Ord:RPR: NONREACTIVE

## 2020-04-08 LAB — VARICELLA ZOSTER IGG: Varicella zoster virus Ab.IgG:PrThr:Pt:Ser:Ord:: POSITIVE

## 2020-04-08 LAB — HIV ANTIGEN/ANTIBODY COMBO: HIV 1+2 Ab+HIV1 p24 Ag:PrThr:Pt:Ser/Plas:Ord:IA: NONREACTIVE

## 2020-04-08 LAB — HEPATITIS B CORE TOTAL ANTIBODY: Hepatitis B virus core Ab:PrThr:Pt:Ser/Plas:Ord:IA: NONREACTIVE

## 2020-04-08 LAB — HEPATITIS B SURFACE ANTIGEN: Hepatitis B virus surface Ag:PrThr:Pt:Ser:Ord:: NONREACTIVE

## 2020-04-08 NOTE — Unmapped (Signed)
Bone Marrow Transplant and Cellular Therapy Program  Immunosuppressive Therapy Note    Susan Kane is a 59 y.o. 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 Ms. Mullane is currently day +82.    Current dose: tacrolimus 1.5 mg in AM and 2 mg in PM    Goal tacrolimus Level: 5-10 ng/mL    Resulted level: 6 ng/mL     Lab Results   Component Value Date/Time    TACROLIMUS 6.0 04/07/2020 10:32 AM    TACROLIMUS 3.5 (L) 04/05/2020 09:56 AM    TACROLIMUS 5.7 04/02/2020 10:41 AM    TACROLIMUS 5.1 03/31/2020 10:24 AM    TACROLIMUS 6.1 03/26/2020 08:11 AM     Lab Results   Component Value Date/Time    CREATININE 1.01 04/07/2020 10:32 AM    CREATININE 1.07 (H) 04/05/2020 09:56 AM    CREATININE 1.10 (H) 04/02/2020 10:41 AM    CREATININE 1.30 (H) 03/31/2020 10:24 AM      Assessment/Recommendation: Tacrolimus level is therapeutic. Her SCr is decreasing back to baseline and Alk phos is mildly elevated but stable. AST, ALT and TBili are WNL. Given the above, plan to continue tacrolimus dose to 1.5 mg PO qAM and 2 mg qPM and will recheck at next clinic visit to further assess trend and need for adjustment.     We will continue to monitor levels.  Patient will be followed for changes in renal and hepatic function, toxicity, and efficacy.     Rulon Abide, PharmD, BCOP  Clinical Pharmacist Practitioner, BMT

## 2020-04-08 NOTE — Unmapped (Signed)
SW Outpatient Note:    Service: BMT Clinic    SW spoke with Pt to follow up from BMT SW assessment update on 04/08/20.  Pt confirmed that she met with BMT provider, Lanae Boast, MD, and reviewed plan to move forward with second allogenic stem cell transplant on 04/17/20.  SW inquired about contacting Pt's daughter, Kamilya Wakeman, daughter [phone]), who has been identified as the backup caregiver.  Pt requested to contact her daughter first to review and inform of need for phone call with SW to confirm backup caregiver plan/status.  SW encouraged Pt to provide SW contact info to her daughter to call, and Pt also stated that this SW could attempt to contact daughter later in the day.    3:14 PM  Backup: Kamela Blansett, daughter [phone]).  Social worker spoke with Gordy Councilman via telephone on 04/08/20 and educated her on the responsibilities and expectations of the backup caregiver.  Jasmine reports willingness and availability to relieve the primary caregiver to assist the patients with pre-transplant work-up, procedures, stem cell collection, and post-transplant recovery as needed.  Leavy Cella is aware of caregiver responsibilities and verbalizes agreement to do whatever is necessary to support her father, and denies any barriers to fulfilling this role.   Additionally, Jasmine plans to travel to [facility] and reports she has flexibility from her employer to take time off in order to fulfill backup caregiver role.      Grace Isaac, LCSW  [facility] Social Work   Phone [phone]  Pager [phone]

## 2020-04-09 ENCOUNTER — Encounter: Admit: 2020-04-09 | Discharge: 2020-04-10 | Payer: PRIVATE HEALTH INSURANCE

## 2020-04-09 DIAGNOSIS — Z6820 Body mass index (BMI) 20.0-20.9, adult: Secondary | ICD-10-CM | POA: Diagnosis not present

## 2020-04-09 DIAGNOSIS — Z9484 Stem cells transplant status: Secondary | ICD-10-CM | POA: Diagnosis not present

## 2020-04-09 DIAGNOSIS — Z79890 Hormone replacement therapy (postmenopausal): Secondary | ICD-10-CM | POA: Diagnosis not present

## 2020-04-09 DIAGNOSIS — R590 Localized enlarged lymph nodes: Secondary | ICD-10-CM | POA: Diagnosis not present

## 2020-04-09 DIAGNOSIS — Z792 Long term (current) use of antibiotics: Secondary | ICD-10-CM | POA: Diagnosis not present

## 2020-04-09 DIAGNOSIS — Z9481 Bone marrow transplant status: Secondary | ICD-10-CM | POA: Diagnosis not present

## 2020-04-09 DIAGNOSIS — D701 Agranulocytosis secondary to cancer chemotherapy: Secondary | ICD-10-CM | POA: Diagnosis not present

## 2020-04-09 DIAGNOSIS — E039 Hypothyroidism, unspecified: Secondary | ICD-10-CM | POA: Diagnosis not present

## 2020-04-09 DIAGNOSIS — B1910 Unspecified viral hepatitis B without hepatic coma: Secondary | ICD-10-CM | POA: Diagnosis not present

## 2020-04-09 DIAGNOSIS — D619 Aplastic anemia, unspecified: Secondary | ICD-10-CM | POA: Diagnosis not present

## 2020-04-09 DIAGNOSIS — Z9221 Personal history of antineoplastic chemotherapy: Secondary | ICD-10-CM | POA: Diagnosis not present

## 2020-04-09 DIAGNOSIS — T451X5A Adverse effect of antineoplastic and immunosuppressive drugs, initial encounter: Secondary | ICD-10-CM | POA: Diagnosis not present

## 2020-04-09 DIAGNOSIS — Z7682 Awaiting organ transplant status: Secondary | ICD-10-CM | POA: Diagnosis not present

## 2020-04-09 DIAGNOSIS — D801 Nonfamilial hypogammaglobulinemia: Secondary | ICD-10-CM | POA: Diagnosis not present

## 2020-04-09 DIAGNOSIS — R161 Splenomegaly, not elsewhere classified: Secondary | ICD-10-CM | POA: Diagnosis not present

## 2020-04-09 DIAGNOSIS — C9110 Chronic lymphocytic leukemia of B-cell type not having achieved remission: Secondary | ICD-10-CM | POA: Diagnosis not present

## 2020-04-09 DIAGNOSIS — R197 Diarrhea, unspecified: Secondary | ICD-10-CM | POA: Diagnosis not present

## 2020-04-09 LAB — CBC W/ AUTO DIFF
BASOPHILS ABSOLUTE COUNT: 0 10*9/L (ref 0.0–0.1)
BASOPHILS RELATIVE PERCENT: 1.2 %
EOSINOPHILS RELATIVE PERCENT: 0 %
HEMATOCRIT: 18.9 % — ABNORMAL LOW (ref 36.0–46.0)
HEMOGLOBIN: 6.6 g/dL — ABNORMAL LOW (ref 12.0–16.0)
LARGE UNSTAINED CELLS: 15 % — ABNORMAL HIGH (ref 0–4)
LYMPHOCYTES ABSOLUTE COUNT: 0.1 10*9/L — ABNORMAL LOW (ref 1.5–5.0)
MEAN CORPUSCULAR HEMOGLOBIN CONC: 35.1 g/dL (ref 31.0–37.0)
MEAN CORPUSCULAR HEMOGLOBIN: 28.5 pg (ref 26.0–34.0)
MEAN CORPUSCULAR VOLUME: 81.1 fL (ref 80.0–100.0)
MEAN PLATELET VOLUME: 8.4 fL (ref 7.0–10.0)
MONOCYTES ABSOLUTE COUNT: 0.1 10*9/L — ABNORMAL LOW (ref 0.2–0.8)
MONOCYTES RELATIVE PERCENT: 24.9 %
NEUTROPHILS ABSOLUTE COUNT: 0.1 10*9/L — CL (ref 2.0–7.5)
NEUTROPHILS RELATIVE PERCENT: 32.5 %
PLATELET COUNT: 8 10*9/L — CL (ref 150–440)
RED BLOOD CELL COUNT: 2.33 10*12/L — ABNORMAL LOW (ref 4.00–5.20)
RED CELL DISTRIBUTION WIDTH: 15.8 % — ABNORMAL HIGH (ref 12.0–15.0)
WBC ADJUSTED: 0.3 10*9/L — CL (ref 4.5–11.0)

## 2020-04-09 LAB — URINALYSIS
BILIRUBIN UA: NEGATIVE
GLUCOSE UA: NEGATIVE
KETONES UA: NEGATIVE
LEUKOCYTE ESTERASE UA: NEGATIVE
NITRITE UA: NEGATIVE
PH UA: 6 (ref 5.0–9.0)
PROTEIN UA: NEGATIVE
RBC UA: 3 /HPF (ref <=4)
SPECIFIC GRAVITY UA: 1.006 (ref 1.003–1.030)
SQUAMOUS EPITHELIAL: 1 /HPF (ref 0–5)
TRANSITIONAL EPITHELIAL: <1 /HPF (ref 0–2)
UROBILINOGEN UA: 0.2
WBC UA: 8 /HPF — ABNORMAL HIGH (ref 0–5)

## 2020-04-09 LAB — COMPREHENSIVE METABOLIC PANEL
ALBUMIN: 3.7 g/dL (ref 3.4–5.0)
ALKALINE PHOSPHATASE: 115 U/L (ref 46–116)
ALT (SGPT): 44 U/L (ref 10–49)
ANION GAP: 8 mmol/L (ref 5–14)
AST (SGOT): 23 U/L (ref <=34)
BLOOD UREA NITROGEN: 27 mg/dL — ABNORMAL HIGH (ref 9–23)
BUN / CREAT RATIO: 26
CHLORIDE: 107 mmol/L (ref 98–107)
CO2: 24 mmol/L (ref 20.0–31.0)
CREATININE: 1.05 mg/dL — ABNORMAL HIGH
EGFR CKD-EPI AA FEMALE: 67 mL/min/{1.73_m2} (ref >=60)
EGFR CKD-EPI NON-AA FEMALE: 58 mL/min/{1.73_m2} — ABNORMAL LOW (ref >=60)
GLUCOSE RANDOM: 181 mg/dL — ABNORMAL HIGH (ref 70–179)
POTASSIUM: 3.6 mmol/L (ref 3.4–4.5)
PROTEIN TOTAL: 6 g/dL (ref 5.7–8.2)
SODIUM: 139 mmol/L (ref 135–145)

## 2020-04-09 LAB — TOXIC GRANULATION

## 2020-04-09 LAB — PROTIME: Coagulation tissue factor induced:Time:Pt:PPP:Qn:Coag: 18 — ABNORMAL HIGH

## 2020-04-09 LAB — PROTIME-INR: INR: 1.55

## 2020-04-09 LAB — LYMPHOCYTES RELATIVE PERCENT: Lymphocytes/100 leukocytes:NFr:Pt:Bld:Qn:Automated count: 26.5

## 2020-04-09 LAB — PLATELET COUNT: Platelets:NCnc:Pt:Bld:Qn:Automated count: 23 — ABNORMAL LOW

## 2020-04-09 LAB — BACTERIA

## 2020-04-09 LAB — POTASSIUM: Potassium:SCnc:Pt:Ser/Plas:Qn:: 3.6

## 2020-04-09 LAB — SLIDE REVIEW

## 2020-04-09 LAB — HTLV I/II ANTIBODIES: HTLV I+II Ab:PrThr:Pt:Ser:Ord:: NEGATIVE

## 2020-04-09 LAB — MAGNESIUM: Magnesium:MCnc:Pt:Ser/Plas:Qn:: 1.5 — ABNORMAL LOW

## 2020-04-09 LAB — LACTATE DEHYDROGENASE: Lactate dehydrogenase:CCnc:Pt:Ser/Plas:Qn:Reaction: pyruvate to lactate: 170

## 2020-04-09 MED ADMIN — heparin, porcine (PF) 100 unit/mL injection 200 Units: 200 [IU] | INTRAVENOUS | @ 14:00:00 | Stop: 2020-04-09

## 2020-04-09 MED ADMIN — tbo-filgrastim (GRANIX) injection 300 mcg: 300 ug | SUBCUTANEOUS | @ 15:00:00 | Stop: 2020-04-09

## 2020-04-09 NOTE — Unmapped (Signed)
BMT-CT Clinic Follow-up    Referring Physician: Jillyn Hidden Nida Boatman) [name], MD   Primary Care Provider: Thora Lance, MD  BMT Attending MD: Lanae Boast, MD    Disease: CLL  Current disease status: SD (stable disease)  Type of Transplant: RIC MUD Allo  Graft Source: Cryopreserved PBSCs  Transplant Day: +86    Study Participant: BMT CTN 1704: Composite Health Assessment Risk Model for Older Adults: Applying Pre-transplant Comorbidity, Geriatric Assessment and Biomarkers to Predict Non-Relapse Mortality After Allogeneic Transplant (CHARM).  ??  HPI:  Susan Kane??is a 59 y.o.??woman??with a diagnosis of CLL??and aplastic anemia who is now s/p RIC MUD AlloSCT. Her transplant course was complicated by delayed engraftment. ??She was initially discharged from the hospital on 02/23/2020. However was re-admitted on 02/27/20 with a fever and need for workup and IV cefepime. Infectious workup was unrevealing, IV cefepime was stopped on 03/02/20, and she was once again discharged on 03/03/20.  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  Currently planning for second transplant.      Interval History: Susan Kane is here for follow up after transplant. Next weekend she will be admitted for 2nd transplant. Using same donor more immune suppresive conditioning regiment.  She has no e/o bleeding.  She has some fatigue.   She is eating ok and drinking ok. Taking zofran BID to avoid nausea with pills. She has no ne sx to report.  She did miss the dose of VitK on yesterday.   She will complete this tomorrow.  Will repeat next week.  She is intermittently having diarrhea. She is not taking imodium.   She has no rash or itching.  No infectious symptoms. Urine has some WBC but was not a clean catch.      Oncology History Overview Note   CLL:    Summary as per Dr. Kalman Drape most recent note with minor edits/additions from my review of records    1.??CLL?????diagnosed in August 2010, flow cytometry consistent with CLL  ?? Enlarged left??inguinal lymph node January 2019,??small??neck/axillary nodes and palpable splenomegaly 09/11/2017  ?? CTs??on 09/17/2017-3 cm necrotic appearing lymph node in the left inguinal region, borderline enlarged pelvic/retroperitoneal, chest, and axillary nodes. ??Mild splenomegaly.  ?? Ultrasound-guided biopsy of the left inguinal lymph node 09/18/2017, slightly purulent fluid aspirated, core biopsy is consistent with an atypical lymphoid proliferation???extensive necrosis with surrounding epithelioid histiocytes, limited intact lymphoid tissue involved with CLL  ?? Incisional biopsy of a necrotic/purulent left inguinal lymph node on 10/01/2017???extensive necrosis with granulomatous inflammation, small amount of viable lymphoid tissue involved with CLL, AFB and fungal stains negative  ?? Peripheral blood FISH analysis 02/05/2018??? +deletion 13q14, no evidence of p53 (17p13) deletion, no evidence of 11q22??deletion  - normal karyotype (46,XX)  ?? Bone marrow biopsy 02/26/2018???hypercellular marrow with extensive involvement by CLL, lymphocytes represent??85% of all cells  ?? Ibrutinib initiated 04/03/2018  ?? Ibrutinib placed on hold 04/11/2018 due to onset of arthralgias  ?? Ibrutinib resumed 04/16/2018, discontinued 04/25/2018 secondary to severe arthralgias/arthritis  ?? Ibrutinib resumed at a dose of 140 mg daily 05/03/2018  ?? Ibrutinib dose adjusted to 140 mg alternating with 280 mg 06/25/2018  ?? 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 ??Augmentin prescribed.  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 DAT negative, bilirubin and LDH normal  June 2019, progressive symptomatic anemia 04/01/2018, red cell transfusions 04/01/2018,??04/30/2018,??05/28/2018, 06/17/2018, and 07/05/2018  7.????Hypogammaglobulinemia    Baseline BM bx reviewed at [facility]  Final Diagnosis   (Outside Case #:  [id number], dated 02/26/2018)  Bone marrow, aspiration and biopsy  -  Hypercellular bone marrow (80%) with extensive involvement by chronic lymphocytic leukemia (87% lymphocytes by manual aspirate differential)  (See Comment)  -  By outside report, cytogenetic results are normal     Karyotype: 25, XX and FISH with 13q del but no 11q or 17p del    Repeat BM bx: (done after being off ibrutinib x1 month)  80% involvement by CLL    08/13/18: Presents to [facility] to discuss plan of care     Chronic lymphocytic leukemia (CLL), B-cell (CMS-HCC)   07/12/2018 Initial Diagnosis    Chronic lymphocytic leukemia (CLL), B-cell (CMS-HCC)     08/26/2018 -  Chemotherapy    Acalabrutinib started (approximate date)     11/14/2018 -  Chemotherapy    Acalabrutinib discontinued due to progressive bone marrow failure     12/06/2018 -  Chemotherapy    Ritux x1 given due to CLL still making up majority of BM cellularity (though in the setting of near aplasia of rest of TLH)     03/19/2019 -  Chemotherapy    Equine ATG / CsA for aplastic anemia (suspect 2/t acalabrutinib)     Chronic lymphocytic leukemia of B-cell type not having achieved remission (CMS-HCC)   01/21/2019 Initial Diagnosis    Chronic lymphocytic leukemia of B-cell type not having achieved remission (CMS-HCC)     01/01/2020 -  Chemotherapy    BMT IP BENDAMUSTINE / FLUDARABINE / RITUXIMAB (OP RITUXIMAB Days -13 & -6) (MUD)  bendamustine 130 mg/m2 IV on days -5, -4, -3   fludarabine 30 mg/m2 IV on days -5, -4, -3   riTUXimab 375 mg/m2 IV on day -13 as a OP treatment day, then 1,000 mg/m2 on days -6 as a OP treatment day, +1, +8   tacrolimus, 0.045 mg/kg PO BID on days -3 and -2 then  0.03 mg/kg PO BID starting on day -1   methotrexate 5 mg/m2 IV on days +1, +3, +6, +11   tbo-filgrastim 300 mcg (<= 75 kg) / 480 mcg (> 75 kg) q24h starting on day +7        Patient Active Problem List   Diagnosis   ??? Chronic lymphocytic leukemia (CLL), B-cell (CMS-HCC)   ??? Hypogammaglobulinemia (CMS-HCC)   ??? Chronic lymphocytic leukemia of B-cell type not having achieved remission (CMS-HCC)   ??? Aplastic anemia (CMS-HCC)   ??? 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 Drug-induced nausea and vomiting   ??? Stem cell transplant candidate   ??? Chemotherapy induced neutropenia (CMS-HCC)   ??? History of allogeneic stem cell transplant (CMS-HCC)   ??? Osteopenia   ??? Neutropenia, drug-induced (CMS-HCC)   ??? Immunosuppressed status (CMS-HCC)   ??? Bone marrow hypocellularity   ??? Neutropenia (CMS-HCC)     Review of Systems:  A full system review was performed and was negative except as noted in the above interval history.    Reviewed and updated past medical, surgical, social, and family history as appropriate.      No Known Allergies  Current Outpatient Medications   Medication Sig Dispense Refill   ??? calcium carbonate-vitamin D3 600 mg(1,500mg ) -800 unit Tab Take 1 tablet by mouth Two (2) times a day. (Patient not taking: Reported on 03/05/2020) 60 tablet 5   ???  cefdinir (OMNICEF) 300 MG capsule Take 1 capsule (300 mg total) by mouth Two (2) times a day. 60 capsule 0   ??? eltrombopag (PROMACTA) 50 MG tablet Take 2 tablets (100 mg total) by mouth daily. Administer on an empty stomach, 1 hour before or 2 hours after a meal. 60 tablet 2   ??? famotidine (PEPCID) 20 MG tablet Take 1 tablet (20 mg total) by mouth two (2) times a day as needed for heartburn. 60 tablet 0   ??? folic acid (FOLVITE) 1 MG tablet Take 1 tablet (1 mg total) by mouth at bedtime. 30 tablet 5   ??? levothyroxine (SYNTHROID) 100 MCG tablet Take 1 tablet (100 mcg total) by mouth daily. 30 tablet 5   ??? lithium (LITHOBID) 300 MG ER tablet Take 1 tablet (300 mg total) by mouth at bedtime. 30 tablet 5   ??? magnesium oxide-Mg AA chelate (MAGNESIUM, AMINO ACID CHELATE,) 133 mg Tab Take 1 tablet by mouth daily. 100 tablet 2   ??? ondansetron (ZOFRAN) 8 MG tablet Take 1 tablet (8 mg total) by mouth every twelve (12) hours as needed for nausea. 30 tablet 2   ??? phytonadione, vitamin K1, (MEPHYTON) 5 mg tablet Take 1 tablet (5 mg total) by mouth daily. 3 tablet 0   ??? polyethylene glycol (MIRALAX) 17 gram packet Take 17 g by mouth daily as needed (constipation).     ??? posaconazole (NOXAFIL) 100 mg TbEC delayed released tablet Take 3 tablets (300 mg total) by mouth daily. 90 tablet 1   ??? tacrolimus (PROGRAF) 0.5 MG capsule Take 3 capsules (1.5 mg total) by mouth daily AND 4 capsules (2 mg total) nightly. 210 capsule 5   ??? TRINTELLIX 10 mg tablet Take 1 tablet (10 mg total) by mouth at bedtime. 30 tablet 5   ??? valACYclovir (VALTREX) 500 MG tablet Take 1 tablet (500 mg total) by mouth daily. 30 tablet 11   ??? zolpidem (AMBIEN) 5 MG tablet Take 1 to 2 tablets (5 mg-10 mg) as needed for insomnia. 60 tablet 0     No current facility-administered medications for this visit.     Facility-Administered Medications Ordered in Other Visits   Medication Dose Route Frequency Provider Last Rate Last Admin   ??? tbo-filgrastim (GRANIX) injection 300 mcg  300 mcg Subcutaneous Once [facility], ANP         Objective:  Vitals:    04/09/20 1004   BP: 126/75   Pulse: 89   Resp: 16   Temp: 36.7 ??C (98.1 ??F)   TempSrc: Oral   SpO2: 100%   Weight: 55 kg (121 lb 4.8 oz)     Physical Exam: essentially unchanged from previously documented physical exam  General appearance - Thin WF, pale coloring, no distress   Mental status - affect appropriate to situation   Eyes - conjunctivae clear, sclera anicteric   ENT - MMM, no oral ulcerations or thrush  Neck - supple, no palpable LAD  Chest - lungs are clear to auscultation bilaterally without wheezes or crackles  Heart - regular rate and rhythm without murmur, rub, or gallop   Abdomen - soft, bowel sounds within normal limits, no masses, no tenderness, no hepatosplenomegaly   Neurological - motor and sensory grossly normal bilaterally  Extremities - no pedal edema noted  Skin - no skin breakdown noted, no rash, petechiae noted on upper chest   Venous access - line site is non-tender, no erythema/drainage    Karnofsky/Lansky Performance Status  80, Normal activity with effort; some signs or symptoms of disease (ECOG equivalent 1)    Test Results:   Reviewed in EPIC. Abnormal values discussed below.     Assessment/Plan:  Susan Kane is 59 yo woman with CLL as well as AA/bone marrow failure.  ??  CLL: CT C/A/P on 10/10/19 revealed no evidence of LAD or organomegaly. Possible endometrial fluid or thickening in the fundus of the uterus.  10/07/19 BMBx: Scant bone marrow sampling (<5% cellular) essentially devoid of hematopoietic elements.  - ??Persistent CLL representing 23% of marrow cellularity by flow cytometric analysis.  -  Corresponding CBC: WBC 1.0, ANC 0.1.  ????????  ??  12/26/19: BmBx:   - ??Hypocellular (20% overall) involved by chronic lymphocytic leukemia/small lymphocytic lymphoma, representing approximately 20% of marrow cells by PAX5 immunohistochemistry.  - Normal karyotype: 46,XX[20]    02/10/20: BmBx:  -Markedly hypocellular bone marrow (< 5%) with essentially absent hematopoiesis. Normal Recipient and Donor karyotype:  46,XX[2]//46,XY[7].     Repeat bone marrow biopsy done Wednesday and is pending.     BMT:??SD (stable disease)  HCT-CI (age adjusted) 4??(active CLL, psych, age adjusted)    Conditioning:  1. Rituximab 375 mg/m2 on day -13 and 1000 mg/m2 on D-6, +1, and +8  2. Bendamustine 130 mg/m2 IV daily over 10 minutes on D-5 through -3  3. Fludarabine 30 mg/m2 IV daily over 30 minutes on D-5 through -3  ??  Donor: 10/10, ABO identical; O - , CMV negative  Cell dose: 5.51x10^6 cells/kg    Engraftment:   - Granix D+12 through WBC recovery (ANC 1.0 x 2 days or 3.0 x 1 day)  - Viral studies showed weakly positive EBV, CMV, HHV-6, plan to monitor for now  - 02/10/20 D+30 BMBx: markedly hypocellular marrow (<5%) with essentially absent hematopoiesis. Chimerisms:     Ref. Range 02/09/2020 00:23 02/10/2020 14:45 03/08/2020 13:49 03/08/2020 13:49 8/23 04/07/20   Specimen Type Unknown Blood Bone Marrow Blood Blood  Blood and marrow    T-Cell (CD3) Recipient: Latest Units: %    >95 >95 Pending   T-Cell (CD3) Donor: Latest Units: %    <5 <5    Unfract. Recipient: Latest Units: % 37 17 14  15     Unfract. Donor: Latest Units: % 63 83 86  85      GvHD prophylaxis:   1. Tacrolimus goal 5-10, dose adjustments per BMT pharmacy.   ??** Current dose 1.5mg  BID  2. Completed Methotrexate 5 mg/m2 on D+1, +3, +6 and +11  3. rATG 1 mg/kg on D-2 and -1 included  ??  Heme:  Transfusion criteria: ABO identical donor [O-]   - Standard txf criteria: 1 unit of PRBCs for Hgb<7 and 1 unit of platelets for Plt <10K or bleeding.     ** No history of transfusion reactions.   - Granix for ANC of 1 or less [tried Udenyca but did not get a lasting result]    ** Given 8/27, 8/30, 9/3, 9/8, 9/10, 04/05/20, 9/17   - Has been requiring PRBCs and platelets weekly at least.  - Remains on Promacta 100 mg every day.    **Severe neutopenia with pancytopenia 2 units of reds today and plt.     ID:????  Prophylaxis:  - Antiviral: Valtrex 500mg  daily.   - Antifungal: Posaconazole 300 mg daily  - Antibacterial:??Starting Cefdinir prophy [04/05/20]     - PJP: Montly pentamidine [7/31, 03/31/20].    CMV:   03/02/20: CMV 884. Valganciclovir  started.    03/08/20: CMV decreased after 1 week of therapy to 140.  03/17/20: Last CMV <50. Decreased valcyte to 450 mg BID.   ** Letermovir denied. Appeal in progress.    03/22/20: CMV not detected. Maintenance dosing ends on 9/8  03/31/20: DC Valcyte. CMV negative.   ??  HHV-6:   - Peaked on PCR at 41.2, now negative.   ??  EBV:   - Weekly, negative 03/31/20   ??  GI:??  - No active issues other than decreased appetite.     ** Weight is stable.   ??  Nausea:  - Zofran 8mg  prn [taking bid]  ??  Renal:   04/09/20: SCr WNL  ??  FEN:   - Continue folic acid.    Hypomagnesemia:  - Mag Chelate 1 tablet daily.   ??  Endocrine:  Hypothyroid:??  - Continue Synthroid daily.   - TSH, T3, T4 - normal   ??  Hepatic:??  - VOD prophylaxis not warranted as non-myeloablative regimen.   ??  Hepatocellular liver injury pattern:  - Mild increase in ALT, and AlkPhos, likely secondary to posaconazole.     ** Overall stable.   ??  Neuro/Pain:??  - No active issues.    Bone Health:   03/05/20: DEXA scan results:  - spine L1 - L4 T score -1.4  - L proximal femur T score -1.9  - L femoral neck T score -1.8  Based on DEXA scan, patient has osteopenia of spine, femur and femoral neck. Recommend administering zoledronic acid 4 mg IV every 3 months x 2 doses in addition to calcium and vitamin D supplementation 600 mg/ 800 units twice daily.   - Zometa given [8/18], final dose due in November.  ??  Psych:??Has a home psychiatrist. Alanson Aly [phone].   Depression/Anxiety:   - Per outpt note pt was taking Xanax 0.25 mg BID PRN for anxiety.  - Trintellix 10mg  nightly  - Lithium 300mg  nightly   04/02/20: Requested lithium level [last checked in June]     ** Dr. Jennelle Human was updated with results [04/05/20].    ??  Insomnia:  - Ambien 10mg  nightly PRN  ??  Caregiver/Lodging:  - Spouse [name] is her primary caregiver  - Staying at local rental.     Summary:  - Pancytopenia with neutropenia. Transfuse PRBCs today.    - RTC M/W/F as scheduled  - Repeat INR next week as no response noted to one dose of VitK that has been taken.  - Pre transplant testing has been planned for next week to get ready for 2nd transplant.          Westley Hummer ANP-BC, AOCNP  [facility]    Future Appointments   Date Time Provider Department Center   04/09/2020 11:30 AM ONCINF CHAIR 02 [id number] [facility]   04/12/2020  8:15 AM [facility] PFT 1 [id number]   04/12/2020  9:00 AM [facility] DIAG RM 11 ID[facility] [place]   04/12/2020 10:30 AM ONCBMT LABS HONCBMT [facility]   04/12/2020 11:00 AM ONCBMT APP A HONCBMT [facility] 04/12/2020 12:00 PM ONCINF CHAIR 02 HONC3UCA [facility]   04/13/2020  2:00 PM [facility] EKG [id number]   04/13/2020  2:30 PM [facility] ECHO OUTPATIENT 2 IECHOUMH [place]   04/14/2020 10:30 AM ONCBMT LABS HONCBMT [facility]   04/14/2020 11:00 AM ONCBMT APP A HONCBMT [facility]   04/14/2020 12:00 PM ONCINF CHAIR 05  HONC3UCA [facility]   04/16/2020 10:30 AM ONCBMT LABS HONCBMT [facility]   04/16/2020 11:00 AM ONCBMT APP A HONCBMT [facility]   04/21/2020 10:45 AM ONCBMT LABS HONCBMT [facility]   04/21/2020 11:15 AM ONCBMT APP B HONCBMT [facility]   04/21/2020 12:30 PM ONCBMT PROCEDURES HONC3UCA [facility]   04/27/2020 10:15 AM ONCBMT LABS HONCBMT [facility]   04/27/2020 10:45 AM Artelia Laroche, MD HONCBMT [facility]   06/08/2020 11:00 AM ONCINF CHAIR 04 HONC3UCA [facility]     I personally spent 70 minutes face-to-face and non-face-to-face in the care of this patient, which includes all pre, intra, and post visit time on the date of service.

## 2020-04-09 NOTE — Unmapped (Signed)
Addended by: Westley Hummer on: 04/09/2020 02:32 PM     Modules accepted: Orders

## 2020-04-09 NOTE — Unmapped (Signed)
B[facility]row Transplant [facility] Cellular Therapy Program  Immunosuppressive Therapy Note    Susan Kane is a 59 y.o. 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 Susan Kane is currently day +86.    Current dose: tacrolimus 1.5 mg in AM and 2 mg in PM    Goal tacrolimus Level: 5-10 ng/mL    Resulted level: 14.4 ng/mL (drawn ~1430 so likely > 15 ng/mL).    Lab Results   Component Value Date/Time    TACROLIMUS 14.1 04/09/2020 02:27 PM    TACROLIMUS 6.0 04/07/2020 10:32 AM    TACROLIMUS 3.5 (L) 04/05/2020 09:56 AM    TACROLIMUS 5.7 04/02/2020 10:41 AM     Lab Results   Component Value Date/Time    CREATININE 1.05 (H) 04/09/2020 09:46 AM    CREATININE 1.01 04/07/2020 10:32 AM    CREATININE 1.07 (H) 04/05/2020 09:56 AM    CREATININE 1.10 (H) 04/02/2020 10:41 AM      Assessment/Recommendation: Tacrolimus level is supratherapeutic.   Pt confirmed she did not take her dose prior to labs.  Her SCr is stable but slightly higher than last check.  LFTs are WNL. Given the above, plan to hold tacrolimus this evening and tomorrow AM to allow clearance and reduct acrolimus dose to 1 mg PO qAM and 1.5 mg qPM and will recheck at next clinic visit to further assess trend and need for adjustment.  Pt/husband verbalized understanding of this plan.    We will continue to monitor levels.  Patient will be followed for changes in renal and hepatic function, toxicity, and efficacy.     Bettey Costa, PharmD, BCPS, BCOP  BMT Clinical Pharmacist Practitioner

## 2020-04-09 NOTE — Unmapped (Signed)
Patient received Pegrifilgrastim injection according to therapy plan protocol. She received one unit of platlets and 2 units of pRBC's according to therapy plan protocol. The medial lumen of her CVC was flushed. She was provided an AVS and discharged.

## 2020-04-09 NOTE — Unmapped (Signed)
Hospital Outpatient Visit on 04/09/2020   Component Date Value Ref Range Status   ??? Crossmatch 04/09/2020 Compatible   Final   ??? Unit Blood Type 04/09/2020 O Neg   Final   ??? ISBT Number 04/09/2020 9500   Final   ??? Unit # 04/09/2020 [id number]   Final   ??? Status 04/09/2020 Issued   Final   ??? Spec Expiration 04/09/2020 [id number]   Final   ??? Product ID 04/09/2020 Red Blood Cells   Final   ??? PRODUCT CODE 04/09/2020 [id number]   Final   ??? Crossmatch 04/09/2020 Compatible   Final   ??? Unit Blood Type 04/09/2020 O Neg   Final   ??? ISBT Number 04/09/2020 9500   Final   ??? Unit # 04/09/2020 [id number]   Final   ??? Status 04/09/2020 Issued   Final   ??? Spec Expiration 04/09/2020 [id number]   Final   ??? Product ID 04/09/2020 Red Blood Cells   Final   ??? PRODUCT CODE 04/09/2020 [id number]   Final   ??? Platelet 04/09/2020 23* 150 - 440 10*9/L Final   ??? Unit Blood Type 04/09/2020 O Neg   Final   ??? ISBT Number 04/09/2020 9500   Final   ??? Unit # 04/09/2020 [id number]   Final   ??? Status 04/09/2020 Issued   Final   ??? Spec Expiration 04/09/2020 [id number]   Final   ??? Product ID 04/09/2020 Platelets   Final   ??? PRODUCT CODE 04/09/2020 [id number]   Final   Lab on 04/09/2020   Component Date Value Ref Range Status   ??? Sodium 04/09/2020 139  135 - 145 mmol/L Final   ??? Potassium 04/09/2020 3.6  3.4 - 4.5 mmol/L Final   ??? Chloride 04/09/2020 107  98 - 107 mmol/L Final   ??? Anion Gap 04/09/2020 8  5 - 14 mmol/L Final   ??? CO2 04/09/2020 24.0  20.0 - 31.0 mmol/L Final   ??? BUN 04/09/2020 27* 9 - 23 mg/dL Final   ??? Creatinine 04/09/2020 1.05* 0.55 - 1.02 mg/dL Final   ??? BUN/Creatinine Ratio 04/09/2020 26   Final   ??? EGFR CKD-EPI Non-African American,* 04/09/2020 58* >=60 mL/min/1.51m2 Final   ??? EGFR CKD-EPI African American, Fem* 04/09/2020 67  >=60 mL/min/1.60m2 Final   ??? Glucose 04/09/2020 181* 70 - 179 mg/dL Final   ??? Calcium 22/08/5425 9.2  8.7 - 10.4 mg/dL Final   ??? Albumin 01/14/7627 3.7  3.4 - 5.0 g/dL Final   ??? Total Protein 04/09/2020 6.0  5.7 - 8.2 g/dL Final   ??? Total Bilirubin 04/09/2020 0.5  0.3 - 1.2 mg/dL Final   ??? AST 31/51/7616 23  <=34 U/L Final   ??? ALT 04/09/2020 44  10 - 49 U/L Final   ??? Alkaline Phosphatase 04/09/2020 115  46 - 116 U/L Final   ??? Magnesium 04/09/2020 1.5* 1.6 - 2.6 mg/dL Final   ??? LDH 07/37/1062 170  120 - 246 U/L Final   ??? WBC 04/09/2020 0.3* 4.5 - 11.0 10*9/L Final   ??? RBC 04/09/2020 2.33* 4.00 - 5.20 10*12/L Final   ??? HGB 04/09/2020 6.6* 12.0 - 16.0 g/dL Final   ??? HCT 69/48/5462 18.9* 36.0 - 46.0 % Final   ??? MCV 04/09/2020 81.1  80.0 - 100.0 fL Final   ??? MCH 04/09/2020 28.5  26.0 - 34.0 pg Final   ??? MCHC 04/09/2020 35.1  31.0 - 37.0 g/dL Final   ??? RDW 70/35/0093 15.8* 12.0 - 15.0 %  Final   ??? MPV 04/09/2020 8.4  7.0 - 10.0 fL Final   ??? Platelet 04/09/2020 8* 150 - 440 10*9/L Final    Questionable result confirmed. Specimen integrity/identity confirmed.    ??? Variable HGB Concentration 04/09/2020 Slight* Not Present Final   ??? Neutrophils % 04/09/2020 32.5  % Final   ??? Lymphocytes % 04/09/2020 26.5  % Final   ??? Monocytes % 04/09/2020 24.9  % Final   ??? Eosinophils % 04/09/2020 0.0  % Final   ??? Basophils % 04/09/2020 1.2  % Final   ??? Absolute Neutrophils 04/09/2020 0.1* 2.0 - 7.5 10*9/L Final   ??? Absolute Lymphocytes 04/09/2020 0.1* 1.5 - 5.0 10*9/L Final   ??? Absolute Monocytes 04/09/2020 0.1* 0.2 - 0.8 10*9/L Final   ??? Absolute Eosinophils 04/09/2020 0.0  0.0 - 0.4 10*9/L Final   ??? Absolute Basophils 04/09/2020 0.0  0.0 - 0.1 10*9/L Final   ??? Large Unstained Cells 04/09/2020 15* 0 - 4 % Final   ??? Microcytosis 04/09/2020 Slight* Not Present Final   ??? PT 04/09/2020 18.0* 10.3 - 13.4 sec Final   ??? INR 04/09/2020 1.55   Final   ??? Smear Review Comments 04/09/2020 See Comment* Undefined Final    Slide reviewed.   ??? Toxic Granulation 04/09/2020 Present* Not Present Final

## 2020-04-09 NOTE — Unmapped (Signed)
All lab results last 24 hours:    Recent Results (from the past 24 hour(s))   Prepare RBC    Collection Time: 04/08/20  7:00 PM   Result Value Ref Range    Crossmatch Compatible     Unit Blood Type O Neg     ISBT Number 9500     Unit # [id number]     Status Transfused     Product ID Red Blood Cells     PRODUCT CODE [id number]     Crossmatch Compatible     Unit Blood Type O Neg     ISBT Number 9500     Unit # [id number]     Status Released to Avail     Spec Expiration [id number]     Product ID Red Blood Cells     PRODUCT CODE [id number]    PT-INR    Collection Time: 04/09/20  9:44 AM   Result Value Ref Range    PT 18.0 (H) 10.3 - 13.4 sec    INR 1.55    Comprehensive Metabolic Panel    Collection Time: 04/09/20  9:46 AM   Result Value Ref Range    Sodium 139 135 - 145 mmol/L    Potassium 3.6 3.4 - 4.5 mmol/L    Chloride 107 98 - 107 mmol/L    Anion Gap 8 5 - 14 mmol/L    CO2 24.0 20.0 - 31.0 mmol/L    BUN 27 (H) 9 - 23 mg/dL    Creatinine 6.64 (H) 0.55 - 1.02 mg/dL    BUN/Creatinine Ratio 26     EGFR CKD-EPI Non-African American, Female 58 (L) >=60 mL/min/1.90m2    EGFR CKD-EPI African American, Female 73 >=60 mL/min/1.25m2    Glucose 181 (H) 70 - 179 mg/dL    Calcium 9.2 8.7 - 40.3 mg/dL    Albumin 3.7 3.4 - 5.0 g/dL    Total Protein 6.0 5.7 - 8.2 g/dL    Total Bilirubin 0.5 0.3 - 1.2 mg/dL    AST 23 <=47 U/L    ALT 44 10 - 49 U/L    Alkaline Phosphatase 115 46 - 116 U/L   Magnesium Level    Collection Time: 04/09/20  9:46 AM   Result Value Ref Range    Magnesium 1.5 (L) 1.6 - 2.6 mg/dL   Lactate dehydrogenase    Collection Time: 04/09/20  9:46 AM   Result Value Ref Range    LDH 170 120 - 246 U/L   CBC w/ Differential    Collection Time: 04/09/20  9:46 AM   Result Value Ref Range    WBC 0.3 (LL) 4.5 - 11.0 10*9/L    RBC 2.33 (L) 4.00 - 5.20 10*12/L    HGB 6.6 (L) 12.0 - 16.0 g/dL    HCT 42.5 (L) 95.6 - 46.0 %    MCV 81.1 80.0 - 100.0 fL    MCH 28.5 26.0 - 34.0 pg    MCHC 35.1 31.0 - 37.0 g/dL    RDW 38.7 (H) 56.4 - 15.0 %    MPV      Platelet      Variable HGB Concentration Slight (A) Not Present    Microcytosis Slight (A) Not Present   Urinalysis    Collection Time: 04/09/20  9:55 AM   Result Value Ref Range    Color, UA Yellow     Clarity, UA Clear     Specific Gravity, UA 1.006 1.003 - 1.030    pH,  UA 6.0 5.0 - 9.0    Leukocyte Esterase, UA Negative Negative    Nitrite, UA Negative Negative    Protein, UA Negative Negative    Glucose, UA Negative Negative    Ketones, UA Negative Negative    Urobilinogen, UA 0.2 mg/dL 0.2 mg/dL, 1.0 mg/dL    Bilirubin, UA Negative Negative    Blood, UA Moderate (A) Negative    RBC, UA 3 <=4 /HPF    WBC, UA 8 (H) 0 - 5 /HPF    Squam Epithel, UA 1 0 - 5 /HPF    Bacteria, UA Rare (A) None Seen /HPF    Trans Epithel, UA <1 0 - 2 /HPF    Mucus, UA Rare (A) None Seen /HPF   Prepare RBC    Collection Time: 04/09/20 10:41 AM   Result Value Ref Range    Crossmatch Compatible     Unit Blood Type O Neg     ISBT Number 9500     Unit # [id number]     Status Ready     Spec Expiration [id number]     Product ID Red Blood Cells     PRODUCT CODE [id number]     Crossmatch Compatible     Unit Blood Type O Neg     ISBT Number 9500     Unit # [id number]     Status Ready     Spec Expiration [id number]     Product ID Red Blood Cells     PRODUCT CODE [id number]

## 2020-04-10 LAB — TACROLIMUS, TROUGH: Lab: 14.1

## 2020-04-10 MED ORDER — TACROLIMUS 0.5 MG CAPSULE, IMMEDIATE-RELEASE
ORAL_CAPSULE | ORAL | 5 refills | 30 days | Status: CP
Start: 2020-04-10 — End: ?
  Filled 2020-04-13: qty 150, 30d supply, fill #0

## 2020-04-12 ENCOUNTER — Encounter: Admit: 2020-04-12 | Discharge: 2020-04-13 | Payer: PRIVATE HEALTH INSURANCE

## 2020-04-12 ENCOUNTER — Ambulatory Visit: Admit: 2020-04-12 | Discharge: 2020-04-13 | Payer: PRIVATE HEALTH INSURANCE

## 2020-04-12 ENCOUNTER — Other Ambulatory Visit: Admit: 2020-04-12 | Discharge: 2020-04-13 | Payer: PRIVATE HEALTH INSURANCE

## 2020-04-12 DIAGNOSIS — D619 Aplastic anemia, unspecified: Secondary | ICD-10-CM | POA: Diagnosis not present

## 2020-04-12 DIAGNOSIS — Z79890 Hormone replacement therapy (postmenopausal): Secondary | ICD-10-CM | POA: Diagnosis not present

## 2020-04-12 DIAGNOSIS — R197 Diarrhea, unspecified: Secondary | ICD-10-CM | POA: Diagnosis not present

## 2020-04-12 DIAGNOSIS — Z7682 Awaiting organ transplant status: Secondary | ICD-10-CM | POA: Diagnosis not present

## 2020-04-12 DIAGNOSIS — Z452 Encounter for adjustment and management of vascular access device: Secondary | ICD-10-CM | POA: Diagnosis not present

## 2020-04-12 DIAGNOSIS — Z79899 Other long term (current) drug therapy: Secondary | ICD-10-CM | POA: Diagnosis not present

## 2020-04-12 DIAGNOSIS — B1910 Unspecified viral hepatitis B without hepatic coma: Secondary | ICD-10-CM | POA: Diagnosis not present

## 2020-04-12 DIAGNOSIS — D801 Nonfamilial hypogammaglobulinemia: Secondary | ICD-10-CM | POA: Diagnosis not present

## 2020-04-12 DIAGNOSIS — Z9484 Stem cells transplant status: Secondary | ICD-10-CM | POA: Diagnosis not present

## 2020-04-12 DIAGNOSIS — E039 Hypothyroidism, unspecified: Secondary | ICD-10-CM | POA: Diagnosis not present

## 2020-04-12 DIAGNOSIS — C9110 Chronic lymphocytic leukemia of B-cell type not having achieved remission: Secondary | ICD-10-CM | POA: Diagnosis not present

## 2020-04-12 DIAGNOSIS — Z792 Long term (current) use of antibiotics: Secondary | ICD-10-CM | POA: Diagnosis not present

## 2020-04-12 DIAGNOSIS — D702 Other drug-induced agranulocytosis: Principal | ICD-10-CM

## 2020-04-12 LAB — COMPREHENSIVE METABOLIC PANEL
ALBUMIN: 3.9 g/dL (ref 3.4–5.0)
ALKALINE PHOSPHATASE: 114 U/L (ref 46–116)
ALT (SGPT): 49 U/L (ref 10–49)
ANION GAP: 10 mmol/L (ref 5–14)
AST (SGOT): 25 U/L (ref <=34)
BILIRUBIN TOTAL: 0.6 mg/dL (ref 0.3–1.2)
BLOOD UREA NITROGEN: 25 mg/dL — ABNORMAL HIGH (ref 9–23)
BUN / CREAT RATIO: 21
CALCIUM: 9.3 mg/dL (ref 8.7–10.4)
CHLORIDE: 104 mmol/L (ref 98–107)
CO2: 25 mmol/L (ref 20.0–31.0)
EGFR CKD-EPI AA FEMALE: 58 mL/min/{1.73_m2} — ABNORMAL LOW (ref >=60)
EGFR CKD-EPI NON-AA FEMALE: 50 mL/min/{1.73_m2} — ABNORMAL LOW (ref >=60)
GLUCOSE RANDOM: 157 mg/dL (ref 70–179)
POTASSIUM: 3.8 mmol/L (ref 3.4–4.5)
PROTEIN TOTAL: 6.2 g/dL (ref 5.7–8.2)
SODIUM: 139 mmol/L (ref 135–145)

## 2020-04-12 LAB — CBC W/ AUTO DIFF
BASOPHILS ABSOLUTE COUNT: 0 10*9/L (ref 0.0–0.1)
BASOPHILS RELATIVE PERCENT: 0.5 %
EOSINOPHILS ABSOLUTE COUNT: 0 10*9/L (ref 0.0–0.4)
EOSINOPHILS RELATIVE PERCENT: 0.5 %
HEMATOCRIT: 23.5 % — ABNORMAL LOW (ref 36.0–46.0)
HEMOGLOBIN: 8.5 g/dL — ABNORMAL LOW (ref 12.0–16.0)
LARGE UNSTAINED CELLS: 12 % — ABNORMAL HIGH (ref 0–4)
LYMPHOCYTES ABSOLUTE COUNT: 0.1 10*9/L — ABNORMAL LOW (ref 1.5–5.0)
LYMPHOCYTES RELATIVE PERCENT: 21.4 %
MEAN CORPUSCULAR HEMOGLOBIN: 29.9 pg (ref 26.0–34.0)
MEAN CORPUSCULAR VOLUME: 82.7 fL (ref 80.0–100.0)
MEAN PLATELET VOLUME: 8.6 fL (ref 7.0–10.0)
MONOCYTES ABSOLUTE COUNT: 0.1 10*9/L — ABNORMAL LOW (ref 0.2–0.8)
MONOCYTES RELATIVE PERCENT: 22.5 %
NEUTROPHILS ABSOLUTE COUNT: 0.2 10*9/L — CL (ref 2.0–7.5)
NEUTROPHILS RELATIVE PERCENT: 43.4 %
RED BLOOD CELL COUNT: 2.84 10*12/L — ABNORMAL LOW (ref 4.00–5.20)
RED CELL DISTRIBUTION WIDTH: 15.3 % — ABNORMAL HIGH (ref 12.0–15.0)
WBC ADJUSTED: 0.5 10*9/L — ABNORMAL LOW (ref 4.5–11.0)

## 2020-04-12 LAB — TACROLIMUS, TROUGH: Lab: 4.7 — ABNORMAL LOW

## 2020-04-12 LAB — CO2: Carbon dioxide:SCnc:Pt:Ser/Plas:Qn:: 25

## 2020-04-12 LAB — MAGNESIUM: Magnesium:MCnc:Pt:Ser/Plas:Qn:: 1.5 — ABNORMAL LOW

## 2020-04-12 LAB — LACTATE DEHYDROGENASE: Lactate dehydrogenase:CCnc:Pt:Ser/Plas:Qn:Reaction: pyruvate to lactate: 219

## 2020-04-12 LAB — TOXIC GRANULATION

## 2020-04-12 LAB — PLATELET COUNT: Platelets:NCnc:Pt:Bld:Qn:Automated count: 25 — ABNORMAL LOW

## 2020-04-12 LAB — MEAN CORPUSCULAR VOLUME: Erythrocyte mean corpuscular volume:EntVol:Pt:RBC:Qn:Automated count: 82.7

## 2020-04-12 MED ORDER — CEFDINIR 300 MG CAPSULE: 300 mg | capsule | Freq: Two times a day (BID) | 1 refills | 30 days

## 2020-04-12 MED ORDER — CEFDINIR 300 MG CAPSULE
ORAL_CAPSULE | Freq: Two times a day (BID) | ORAL | 1 refills | 30.00000 days | Status: CP
Start: 2020-04-12 — End: 2020-04-12
  Filled 2020-04-12: qty 10, 5d supply, fill #0

## 2020-04-12 MED ADMIN — heparin, porcine (PF) 100 unit/mL injection 200 Units: 200 [IU] | INTRAVENOUS | @ 19:00:00 | Stop: 2020-04-13

## 2020-04-12 MED ADMIN — heparin, porcine (PF) 100 unit/mL injection 200 Units: 200 [IU] | INTRAVENOUS | @ 14:00:00 | Stop: 2020-04-12

## 2020-04-12 MED FILL — CEFDINIR 300 MG CAPSULE: 5 days supply | Qty: 10 | Fill #0 | Status: AC

## 2020-04-12 NOTE — Unmapped (Signed)
[facility]  Immunosuppressive Therapy Note    Susan Kane is a 59 y.o. 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 Susan Kane is currently day +89.    Current dose: tacrolimus 1 mg in AM and 1.5 mg in PM    Goal tacrolimus Level: 5-10 ng/mL    Resulted level: 4.7ng/mL @ 0958    Lab Results   Component Value Date/Time    TACROLIMUS 4.7 (L) 04/12/2020 09:58 AM    TACROLIMUS 14.1 04/09/2020 02:27 PM    TACROLIMUS 6.0 04/07/2020 10:32 AM    TACROLIMUS 3.5 (L) 04/05/2020 09:56 AM     Lab Results   Component Value Date/Time    CREATININE 1.18 (H) 04/12/2020 09:58 AM    CREATININE 1.05 (H) 04/09/2020 09:46 AM    CREATININE 1.01 04/07/2020 10:32 AM    CREATININE 1.07 (H) 04/05/2020 09:56 AM      Assessment/Recommendation: Tacrolimus level is likely therapeutic. Scr is slightly elevated and LFTs/Tbili remain WNL. Given the above, will continue tacrolimus 1 mg PO qAM and 1.5 mg qPM and will recheck at next clinic visit to further assess trend and need for adjustment.      We will continue to monitor levels.  Patient will be followed for changes in renal and hepatic function, toxicity, and efficacy.     Sandre Kitty, PharmD  PGY-1 Pharmacy Resident     I have reviewed and agree with the resident plan.    Bettey Costa, PharmD, BCPS, BCOP  BMT Clinical Pharmacist Practitioner

## 2020-04-12 NOTE — Unmapped (Signed)
Instructions:   - A BMT pharmacist will call you if I need to make changes to your tacrolimus dose.   - Continue with testing and we will give you a unit of platelets today    Return to clinic on Wednesday to see one of the providers.     Covid:   Given ongoing novel coronavirus (COVID-19), it is strongly recommended to avoid travel and crowds.  If you develop fever, cough, shortness of breath and/or have known exposure (close contact < 3 feet) with someone who has tested positive, please notify us immediately.    ??? Your best defense is to stay home as much as possible and use good hand hygiene.    For prescription refills:   For refills, please check your medication bottles to see if you have additional refills left. If so, please call your pharmacy and follow the directions to request a refill. If you do not have any refills left, please make a request during your clinic visit or by submitting a request through MyChart or by calling [phone]. Please allow 24 hours if your request is made during the week or 48 hours if requests are made on the weekends or holidays.     Labs:   Lab Results   Component Value Date    WBC 0.5 (L) 04/12/2020    HGB 8.5 (L) 04/12/2020    HCT 23.5 (L) 04/12/2020    PLT 8 (LL) 04/12/2020     Lab Results   Component Value Date    NA 139 04/09/2020    K 3.6 04/09/2020    CL 107 04/09/2020    CO2 24.0 04/09/2020    BUN 27 (H) 04/09/2020    CREATININE 1.05 (H) 04/09/2020    GLU 181 (H) 04/09/2020    CALCIUM 9.2 04/09/2020    MG 1.5 (L) 04/12/2020    PHOS 3.6 03/01/2020     Lab Results   Component Value Date    BILITOT 0.5 04/09/2020    BILIDIR 0.30 03/01/2020    PROT 6.0 04/09/2020    ALBUMIN 3.7 04/09/2020    ALT 44 04/09/2020    AST 23 04/09/2020    ALKPHOS 115 04/09/2020    GGT 22 01/09/2020     Lab Results   Component Value Date    INR 1.55 04/09/2020    APTT 59.9 (H) 04/07/2020 --------------------------------------------------------------------------------------------------------------------  For appointments & questions Monday through Friday 8 AM-4:30 PM     Please call [phone] or Toll free [phone]    On Nights, Weekends and Holidays  Call [phone] and ask for the oncologist on call    Please visit [email or web address], a resource created just for family members and caregivers.  This website lists support services, how and where to ask for help. It has tools to assist you as you help [place] care for your loved one.    [facility]  [address]  [place], [place] [address]  [email or web address]

## 2020-04-12 NOTE — Unmapped (Signed)
Results for Susan Kane, Susan Kane (MRN [id number]) as of 04/12/2020 14:59   Ref. Range 04/12/2020 09:58 04/12/2020 14:34   WBC Latest Ref Range: 4.5 - 11.0 10*9/L 0.5 (L)    RBC Latest Ref Range: 4.00 - 5.20 10*12/L 2.84 (L)    HGB Latest Ref Range: 12.0 - 16.0 g/dL 8.5 (L)    HCT Latest Ref Range: 36.0 - 46.0 % 23.5 (L)    MCV Latest Ref Range: 80.0 - 100.0 fL 82.7    MCH Latest Ref Range: 26.0 - 34.0 pg 29.9    MCHC Latest Ref Range: 31.0 - 37.0 g/dL 56.2    RDW Latest Ref Range: 12.0 - 15.0 % 15.3 (H)    MPV Latest Ref Range: 7.0 - 10.0 fL 8.6    Platelet Latest Ref Range: 150 - 440 10*9/L 8 (LL) 25 (L)   Neutrophils % Latest Units: % 43.4    Lymphocytes % Latest Units: % 21.4    Monocytes % Latest Units: % 22.5    Eosinophils % Latest Units: % 0.5    Basophils % Latest Units: % 0.5    Absolute Neutrophils Latest Ref Range: 2.0 - 7.5 10*9/L 0.2 (LL)    Absolute Lymphocytes Latest Ref Range: 1.5 - 5.0 10*9/L 0.1 (L)    Absolute Monocytes  Latest Ref Range: 0.2 - 0.8 10*9/L 0.1 (L)    Absolute Eosinophils Latest Ref Range: 0.0 - 0.4 10*9/L 0.0    Absolute Basophils  Latest Ref Range: 0.0 - 0.1 10*9/L 0.0    Microcytosis Latest Ref Range: Not Present  Slight (A)    Hyperchromasia Latest Ref Range: Not Present  Slight (A)    Smear Review Latest Ref Range: Undefined  See Comment (A)    Large Unstained Cells Latest Ref Range: 0 - 4 % 12 (H)    Variable Hemoglobin Concentration Latest Ref Range: Not Present  Slight (A)    Toxic Granulation Latest Ref Range: Not Present  Present (A)    Sodium Latest Ref Range: 135 - 145 mmol/L 139    Potassium Latest Ref Range: 3.4 - 4.5 mmol/L 3.8    Chloride Latest Ref Range: 98 - 107 mmol/L 104    CO2 Latest Ref Range: 20.0 - 31.0 mmol/L 25.0    Bun Latest Ref Range: 9 - 23 mg/dL 25 (H)    Creatinine Latest Ref Range: 0.55 - 1.02 mg/dL 1.30 (H)    BUN/Creatinine Ratio Unknown 21    EGFR CKD-EPI African American, Female Latest Ref Range: >=60 mL/min/1.67m2 58 (L)    EGFR CKD-EPI Non-African American, Female Latest Ref Range: >=60 mL/min/1.38m2 50 (L)    Anion Gap Latest Ref Range: 5 - 14 mmol/L 10    Glucose Latest Ref Range: 70 - 179 mg/dL 865    Calcium Latest Ref Range: 8.7 - 10.4 mg/dL 9.3    Magnesium Latest Ref Range: 1.6 - 2.6 mg/dL 1.5 (L)    Albumin Latest Ref Range: 3.4 - 5.0 g/dL 3.9    Total Protein Latest Ref Range: 5.7 - 8.2 g/dL 6.2    Total Bilirubin Latest Ref Range: 0.3 - 1.2 mg/dL 0.6    AST Latest Ref Range: <=34 U/L 25    ALT Latest Ref Range: 10 - 49 U/L 49    Alkaline Phosphatase Latest Ref Range: 46 - 116 U/L 114    LDH Latest Ref Range: 120 - 246 U/L 219    Tacrolimus, Trough Latest Ref Range: 5.0 - 15.0 ng/mL 4.7 (L)  ABO Grouping Unknown O NEG    Antibody Screen Unknown NEG      Please call the clinic (or inpatient unit if after hours) if you have any questions or concerns.     --------  For appointments & questions Monday through Friday 8 AM- 4:30 PM please call [phone] or Toll free [phone].     ??? BMT Post Transplant RN Coordinator for patient specific/non-urgent questions: [phone] (Mon-Fri 8 am-4:30 pm)  ??? BMT Post Transplant Urgent Needs/Questions Weekday(Mon-Fri: 8 am - 4:30 pm): [phone]   ??? BMT Unit for Week night (Mon-Fri after 4:30 pm) and Weekend Urgent Questions: [phone]       [facility]  [address]  [place], [place] [address]  [email or web address]

## 2020-04-12 NOTE — Unmapped (Signed)
1155: Patient present for PLT tranfsusion, no acute concerns reported. Consent noted in chart; pre-meds not needed per patient. Patient comfortable with family present and no requests at this time.    1330: Platelet transfusion in progress, 5 minute vitals obtained; no sign of reaction noted. Rate increased from 120 mL/hr to 600 mL/hr per protocol. Will continue to monitor.    1505: Post-transfusion platelets 25, no additional platelets needed. Line care performed; lumen flushed and heparin locked per protcol. AVS declined. Patient discharged home alert, oriented and in stable condition with family present.

## 2020-04-12 NOTE — Unmapped (Signed)
Clinical Assessment Needed For: Dose Change  Medication: Tacrolimus 0.5mg  capsule  Last Fill Date: 03/15/2020  Refill Too Soon until 04/13/20  Was previous dose already scheduled to fill: Yes    Notes to Pharmacist: Scheduled to originally fill today, 04/12/2020.

## 2020-04-12 NOTE — Unmapped (Signed)
BMT-CT Clinic Follow-up    Referring Physician: Jillyn Hidden Nida Boatman) [name], MD   Primary Care Provider: Thora Lance, MD  BMT Attending MD: Lanae Boast, MD    Disease: CLL  Current disease status: SD (stable disease)  Type of Transplant: RIC MUD Allo  Graft Source: Cryopreserved PBSCs  Transplant Day: +89    Study Participant: BMT CTN 1704: Composite Health Assessment Risk Model for Older Adults: Applying Pre-transplant Comorbidity, Geriatric Assessment and Biomarkers to Predict Non-Relapse Mortality After Allogeneic Transplant (CHARM).  ??  HPI:  Susan Kane??is a 59 y.o.??woman??with a diagnosis of CLL??and aplastic anemia who is now s/p RIC MUD AlloSCT. Her transplant course was complicated by delayed engraftment. ??She was initially discharged from the hospital on 02/23/2020. However was re-admitted on 02/27/20 with a fever and need for workup and IV cefepime. Infectious workup was unrevealing, IV cefepime was stopped on 03/02/20, and she was once again discharged on 03/03/20.  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  Currently planning for second transplant.      Interval History: Susan Kane presents with her husband for routine clinic follow up today.  She is continuing with testing for work up of second transplant.  She is overall feeling well, relatively.   She has had more energy since her blood transfusion last week.  No nausea, vomiting, or worsening diarrhea.  She has diarrhea around 3 times per week of small volume, other stools are closer to normal.  She is eating but still below baseline, however was able to have a bowl of spaghetti with marinara sauce and this did well.  No rash or itching, no infectious or bleeding symptoms.      Oncology History Overview Note   CLL:    Summary as per Dr. Kalman Drape most recent note with minor edits/additions from my review of records    1.??CLL?????diagnosed in August 2010, flow cytometry consistent with CLL  ?? Enlarged left??inguinal lymph node January 2019,??small??neck/axillary nodes and palpable splenomegaly 09/11/2017  ?? CTs??on 09/17/2017-3 cm necrotic appearing lymph node in the left inguinal region, borderline enlarged pelvic/retroperitoneal, chest, and axillary nodes. ??Mild splenomegaly.  ?? Ultrasound-guided biopsy of the left inguinal lymph node 09/18/2017, slightly purulent fluid aspirated, core biopsy is consistent with an atypical lymphoid proliferation???extensive necrosis with surrounding epithelioid histiocytes, limited intact lymphoid tissue involved with CLL  ?? Incisional biopsy of a necrotic/purulent left inguinal lymph node on 10/01/2017???extensive necrosis with granulomatous inflammation, small amount of viable lymphoid tissue involved with CLL, AFB and fungal stains negative  ?? Peripheral blood FISH analysis 02/05/2018??? +deletion 13q14, no evidence of p53 (17p13) deletion, no evidence of 11q22??deletion  - normal karyotype (46,XX)  ?? Bone marrow biopsy 02/26/2018???hypercellular marrow with extensive involvement by CLL, lymphocytes represent??85% of all cells  ?? Ibrutinib initiated 04/03/2018  ?? Ibrutinib placed on hold 04/11/2018 due to onset of arthralgias  ?? Ibrutinib resumed 04/16/2018, discontinued 04/25/2018 secondary to severe arthralgias/arthritis  ?? Ibrutinib resumed at a dose of 140 mg daily 05/03/2018  ?? Ibrutinib dose adjusted to 140 mg alternating with 280 mg 06/25/2018  ?? 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 ??Augmentin prescribed.  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    Baseline BM bx reviewed at [facility]  Final Diagnosis   (Outside Case #:  [id number], dated 02/26/2018)  Bone marrow, aspiration and biopsy  -  Hypercellular bone marrow (80%) with extensive involvement by chronic lymphocytic leukemia (87% lymphocytes by manual aspirate differential)  (See Comment)  -  By outside report, cytogenetic results are normal     Karyotype: 86, XX and FISH with 13q del but no 11q or 17p del    Repeat BM bx: (done after being off ibrutinib x1 month)  80% involvement by CLL    08/13/18: Presents to [facility] to discuss plan of care     Chronic lymphocytic leukemia (CLL), B-cell (CMS-HCC)   07/12/2018 Initial Diagnosis    Chronic lymphocytic leukemia (CLL), B-cell (CMS-HCC)     08/26/2018 -  Chemotherapy    Acalabrutinib started (approximate date)     11/14/2018 -  Chemotherapy    Acalabrutinib discontinued due to progressive bone marrow failure     12/06/2018 -  Chemotherapy    Ritux x1 given due to CLL still making up majority of BM cellularity (though in the setting of near aplasia of rest of TLH)     03/19/2019 -  Chemotherapy    Equine ATG / CsA for aplastic anemia (suspect 2/t acalabrutinib)     Chronic lymphocytic leukemia of B-cell type not having achieved remission (CMS-HCC)   01/21/2019 Initial Diagnosis    Chronic lymphocytic leukemia of B-cell type not having achieved remission (CMS-HCC)     01/01/2020 -  Chemotherapy    BMT IP BENDAMUSTINE / FLUDARABINE / RITUXIMAB (OP RITUXIMAB Days -13 & -6) (MUD)  bendamustine 130 mg/m2 IV on days -5, -4, -3   fludarabine 30 mg/m2 IV on days -5, -4, -3   riTUXimab 375 mg/m2 IV on day -13 as a OP treatment day, then 1,000 mg/m2 on days -6 as a OP treatment day, +1, +8   tacrolimus, 0.045 mg/kg PO BID on days -3 and -2 then  0.03 mg/kg PO BID starting on day -1   methotrexate 5 mg/m2 IV on days +1, +3, +6, +11   tbo-filgrastim 300 mcg (<= 75 kg) / 480 mcg (> 75 kg) q24h starting on day +7        Patient Active Problem List Diagnosis   ??? Chronic lymphocytic leukemia (CLL), B-cell (CMS-HCC)   ??? Hypogammaglobulinemia (CMS-HCC)   ??? Chronic lymphocytic leukemia of B-cell type not having achieved remission (CMS-HCC)   ??? Aplastic anemia (CMS-HCC)   ??? 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 Drug-induced nausea and vomiting   ??? Stem cell transplant candidate   ??? Chemotherapy induced neutropenia (CMS-HCC)   ??? History of allogeneic stem cell transplant (CMS-HCC)   ??? Osteopenia   ??? Neutropenia, drug-induced (CMS-HCC)   ??? Immunosuppressed status (CMS-HCC)   ??? Bone marrow hypocellularity   ??? Neutropenia (CMS-HCC)     Review of Systems:  A full system review was performed and was negative except as noted in the above interval history.    Reviewed and updated past medical, surgical, social, and family history as appropriate.      No Known Allergies  Current Outpatient Medications   Medication Sig Dispense Refill   ??? calcium carbonate-vitamin D3 600 mg(1,500mg ) -800 unit Tab Take 1 tablet by mouth Two (2) times a day. (Patient not taking: Reported on 03/05/2020) 60 tablet 5   ??? cefdinir (OMNICEF) 300 MG capsule Take 1 capsule (300 mg total) by mouth  Two (2) times a day. 60 capsule 1   ??? eltrombopag (PROMACTA) 50 MG tablet Take 2 tablets (100 mg total) by mouth daily. Administer on an empty stomach, 1 hour before or 2 hours after a meal. 60 tablet 2   ??? famotidine (PEPCID) 20 MG tablet Take 1 tablet (20 mg total) by mouth two (2) times a day as needed for heartburn. 60 tablet 0   ??? folic acid (FOLVITE) 1 MG tablet Take 1 tablet (1 mg total) by mouth at bedtime. 30 tablet 5   ??? levothyroxine (SYNTHROID) 100 MCG tablet Take 1 tablet (100 mcg total) by mouth daily. 30 tablet 5   ??? lithium (LITHOBID) 300 MG ER tablet Take 1 tablet (300 mg total) by mouth at bedtime. 30 tablet 5   ??? magnesium oxide-Mg AA chelate (MAGNESIUM, AMINO ACID CHELATE,) 133 mg Tab Take 1 tablet by mouth daily. 100 tablet 2   ??? ondansetron (ZOFRAN) 8 MG tablet Take 1 tablet (8 mg total) by mouth every twelve (12) hours as needed for nausea. 30 tablet 2   ??? phytonadione, vitamin K1, (MEPHYTON) 5 mg tablet Take 1 tablet (5 mg total) by mouth daily. 3 tablet 0   ??? polyethylene glycol (MIRALAX) 17 gram packet Take 17 g by mouth daily as needed (constipation).     ??? posaconazole (NOXAFIL) 100 mg TbEC delayed released tablet Take 3 tablets (300 mg total) by mouth daily. 90 tablet 1   ??? tacrolimus (PROGRAF) 0.5 MG capsule Take 2 capsules (1 mg total) by mouth daily AND 3 capsules (1.5 mg total) nightly. 150 capsule 5   ??? TRINTELLIX 10 mg tablet Take 1 tablet (10 mg total) by mouth at bedtime. 30 tablet 5   ??? valACYclovir (VALTREX) 500 MG tablet Take 1 tablet (500 mg total) by mouth daily. 30 tablet 11   ??? zolpidem (AMBIEN) 5 MG tablet Take 1 to 2 tablets (5 mg-10 mg) as needed for insomnia. 60 tablet 0     No current facility-administered medications for this visit.     Objective:  Vitals:    04/12/20 1046   BP: 127/63   Pulse: 88   Resp: 16   Temp: 37.4 ??C (99.3 ??F)   TempSrc: Temporal   SpO2: 98%   Weight: 54.5 kg (120 lb 1.6 oz)   Height: 165.1 cm (5' 5)     Physical Exam: essentially unchanged from previously documented physical exam  General appearance - Thin WF, pale coloring, no distress   Mental status - affect appropriate to situation   Eyes - conjunctivae clear, sclera anicteric   ENT - MMM, no oral ulcerations or thrush  Neck - supple, no palpable LAD  Chest - lungs are clear to auscultation bilaterally without wheezes or crackles  Heart - regular rate and rhythm without murmur, rub, or gallop   Abdomen - soft, bowel sounds within normal limits, no masses, no tenderness, no hepatosplenomegaly   Neurological - motor and sensory grossly normal bilaterally  Extremities - no pedal edema noted  Skin - no skin breakdown noted, no rash, petechiae noted on upper chest   Venous access - line site is non-tender, no erythema/drainage    Karnofsky/Lansky Performance Status  80, Normal activity with effort; some signs or symptoms of disease (ECOG equivalent 1)    Test Results:   Reviewed in EPIC. Abnormal values discussed below.     Assessment/Plan:  Mrs. Redford is 59 yo woman with CLL as well as AA/bone marrow failure.  ??  CLL: CT C/A/P on 10/10/19 revealed no evidence of LAD or organomegaly. Possible endometrial fluid or thickening in the fundus of the uterus.  10/07/19 BMBx: Scant bone marrow sampling (<5% cellular) essentially devoid of hematopoietic elements.  - ??Persistent CLL representing 23% of marrow cellularity by flow cytometric analysis.  -  Corresponding CBC: WBC 1.0, ANC 0.1.  ????????  ??  12/26/19: BmBx:   - ??Hypocellular (20% overall) involved by chronic lymphocytic leukemia/small lymphocytic lymphoma, representing approximately 20% of marrow cells by PAX5 immunohistochemistry.  - Normal karyotype: 46,XX[20]    02/10/20: BmBx:  -Markedly hypocellular bone marrow (< 5%) with essentially absent hematopoiesis. Normal Recipient and Donor karyotype:  46,XX[2]//46,XY[7].     Repeat bone marrow biopsy done 9/15 and is variably cellular with no evidence of CLL.     BMT:??SD (stable disease)  HCT-CI (age adjusted) 4??(active CLL, psych, age adjusted)    Conditioning from 1st transplant:  1. Rituximab 375 mg/m2 on day -13 and 1000 mg/m2 on D-6, +1, and +8  2. Bendamustine 130 mg/m2 IV daily over 10 minutes on D-5 through -3  3. Fludarabine 30 mg/m2 IV daily over 30 minutes on D-5 through -3  ??  Donor: 10/10, ABO identical; O - , CMV negative  Cell dose: 5.51x10^6 cells/kg    Engraftment:   - Granix D+12 through WBC recovery (ANC 1.0 x 2 days or 3.0 x 1 day)  - Viral studies showed weakly positive EBV, CMV, HHV-6, plan to monitor for now  - 02/10/20 D+30 BMBx: markedly hypocellular marrow (<5%) with essentially absent hematopoiesis.     Chimerisms:     Ref. Range 02/09/2020 00:23 02/10/2020 14:45 03/08/2020 13:49 03/08/2020 13:49 8/23 04/07/20   Specimen Type Unknown Blood Bone Marrow Blood Blood  Blood and marrow    T-Cell (CD3) Recipient: Latest Units: %    >95 >95 Pending   T-Cell (CD3) Donor: Latest Units: %    <5 <5    Unfract. Recipient: Latest Units: % 37 17 14  15     Unfract. Donor: Latest Units: % 63 83 86  85      GvHD prophylaxis:   1. Tacrolimus goal 5-10, dose adjustments per BMT pharmacy.   ??** Current dose 1.5mg  BID  2. Completed Methotrexate 5 mg/m2 on D+1, +3, +6 and +11  3. rATG 1 mg/kg on D-2 and -1 included  ??  Heme:  Transfusion criteria: ABO identical donor [O-]   - Standard txf criteria: 1 unit of PRBCs for Hgb<7 and 1 unit of platelets for Plt <10K or bleeding.     ** No history of transfusion reactions.   - Granix for ANC of 1 or less [tried Udenyca but did not get a lasting result]    ** Given 8/27, 8/30, 9/3, 9/8, 9/10, 04/05/20, 9/17 (holding off as she has not had recent response)  - Has been requiring PRBCs and platelets weekly at least.  - Remains on Promacta 100 mg every day.    **Severe neutopenia with thrombocytopenia, receiving one unit of plts today.     ID:????  Prophylaxis:  - Antiviral: Valtrex 500mg  daily.   - Antifungal: Posaconazole 300 mg daily  - Antibacterial:??Starting Cefdinir prophy [04/05/20]     - PJP: Montly pentamidine [7/31, 03/31/20].    CMV:   03/02/20: CMV 884. Valganciclovir started.    03/08/20: CMV decreased after 1 week of therapy to 140.  03/17/20: Last CMV <50. Decreased valcyte to 450 mg BID.   ** Letermovir denied. Appeal  in progress.    03/22/20: CMV not detected. Maintenance dosing ends on 9/8  03/31/20: DC Valcyte. CMV negative.   ??  HHV-6:   - Peaked on PCR at 41.2, now negative.   ??  EBV:   - Weekly, negative 03/31/20   ??  GI:??  - No active issues other than decreased appetite.     ** Weight is stable.   ??  Nausea:  - Zofran 8mg  prn [taking bid]  ??  Renal:   04/09/20: SCr WNL  ??  FEN:   - Continue folic acid.    Hypomagnesemia:  - Mag Chelate 1 tablet daily.   ??  Endocrine:  Hypothyroid:??  - Continue Synthroid daily.   - TSH, T3, T4 - normal ??  Hepatic:??  - VOD prophylaxis not warranted as non-myeloablative regimen.   ??  Hepatocellular liver injury pattern:  - Mild increase in ALT, and AlkPhos, likely secondary to posaconazole.     ** Overall stable.   ??  Neuro/Pain:??  - No active issues.    Bone Health:   03/05/20: DEXA scan results:  - spine L1 - L4 T score -1.4  - L proximal femur T score -1.9  - L femoral neck T score -1.8  Based on DEXA scan, patient has osteopenia of spine, femur and femoral neck. Recommend administering zoledronic acid 4 mg IV every 3 months x 2 doses in addition to calcium and vitamin D supplementation 600 mg/ 800 units twice daily.   - Zometa given [8/18], final dose due in November.  ??  Psych:??Has a home psychiatrist. Alanson Aly [phone].   Depression/Anxiety:   - Per outpt note pt was taking Xanax 0.25 mg BID PRN for anxiety.  - Trintellix 10mg  nightly  - Lithium 300mg  nightly   04/02/20: Requested lithium level [last checked in June]     ** Dr. Jennelle Human was updated with results [04/05/20].    ??  Insomnia:  - Ambien 10mg  nightly PRN  ??  Caregiver/Lodging:  - Spouse [name] is her primary caregiver  - Staying at local rental.     Summary:  - Pancytopenia with neutropenia. Transfuse platelets today.    - RTC M/W/F as scheduled  - Repeat INR Wednesday- now completed Vitamin K x 3 days.  - Pre transplant testing ongoing to get ready for 2nd transplant.      Bernice Mcauliffe Elie Confer, [facility]  Physician Assistant  Adult Bone Marrow Transplantation    Future Appointments   Date Time Provider Department Center   04/12/2020 12:00 PM ONCINF CHAIR 02 [id number]   04/13/2020  2:00 PM CARSER EKG [id number]   04/13/2020  2:30 PM [facility] ECHO OUTPATIENT 2 IE[facility] [place]   04/14/2020 10:30 AM ONCBMT LABS HONCBMT [facility]   04/14/2020 11:00 AM ONCBMT APP A HONCBMT [facility]   04/14/2020 12:00 PM ONCINF CHAIR 05 [id number]   04/16/2020 10:30 AM ONCBMT LABS HONCBMT [facility]   04/16/2020 11:00 AM ONCBMT APP A [facility]ON[facility]MT [facility]   04/21/2020 10:45 AM ONCBMT LABS HONCBMT [facility]   04/21/2020 11:15 AM ONCBMT APP B [facility]ON[facility]MT [facility]   04/21/2020 12:30 PM ONCBMT PROCEDURES [id number]   04/27/2020 10:15 AM ONCBMT LABS HONCBMT [facility]   04/27/2020 10:45 AM Artelia Laroche, MD HONCBMT [facility]   06/08/2020 11:00 AM ONCINF CHAIR 04 [id number]     I personally spent 50 minutes face-to-face and non-face-to-face in the care of  this patient, which includes all pre, intra, and post visit time on the date of service.

## 2020-04-13 ENCOUNTER — Encounter: Admit: 2020-04-13 | Discharge: 2020-04-14 | Payer: PRIVATE HEALTH INSURANCE

## 2020-04-13 DIAGNOSIS — Z7682 Awaiting organ transplant status: Secondary | ICD-10-CM | POA: Diagnosis not present

## 2020-04-13 MED ORDER — ELTROMBOPAG 50 MG TABLET
Freq: Every day | ORAL | 0 days
Start: 2020-04-13 — End: 2020-08-04

## 2020-04-13 MED FILL — TACROLIMUS 0.5 MG CAPSULE, IMMEDIATE-RELEASE: 30 days supply | Qty: 150 | Fill #0 | Status: AC

## 2020-04-13 MED FILL — PROMACTA 50 MG TABLET: 30 days supply | Qty: 60 | Fill #1 | Status: AC

## 2020-04-13 MED FILL — PROMACTA 50 MG TABLET: ORAL | 30 days supply | Qty: 60 | Fill #1

## 2020-04-13 NOTE — Unmapped (Signed)
Spoke with patient to inform of change in dose of tac 0.5 mg 3 caps (1.5 mg) bid to now Tac 0.5 mg = 2 caps (1 mg) AM and 3 caps (1.5 mg) PM.    She is aware of new dose and delivery is scheduled for [facility] on 04/13/20 along with [facility]

## 2020-04-14 ENCOUNTER — Other Ambulatory Visit: Admit: 2020-04-14 | Discharge: 2020-04-15 | Payer: PRIVATE HEALTH INSURANCE

## 2020-04-14 ENCOUNTER — Ambulatory Visit
Admit: 2020-04-14 | Discharge: 2020-04-15 | Payer: PRIVATE HEALTH INSURANCE | Attending: Nurse Practitioner | Primary: Nurse Practitioner

## 2020-04-14 DIAGNOSIS — D759 Disease of blood and blood-forming organs, unspecified: Secondary | ICD-10-CM | POA: Diagnosis not present

## 2020-04-14 DIAGNOSIS — D801 Nonfamilial hypogammaglobulinemia: Secondary | ICD-10-CM | POA: Diagnosis not present

## 2020-04-14 DIAGNOSIS — B1910 Unspecified viral hepatitis B without hepatic coma: Secondary | ICD-10-CM | POA: Diagnosis not present

## 2020-04-14 DIAGNOSIS — C9111 Chronic lymphocytic leukemia of B-cell type in remission: Secondary | ICD-10-CM | POA: Diagnosis not present

## 2020-04-14 DIAGNOSIS — Z79890 Hormone replacement therapy (postmenopausal): Secondary | ICD-10-CM | POA: Diagnosis not present

## 2020-04-14 DIAGNOSIS — Z9484 Stem cells transplant status: Secondary | ICD-10-CM | POA: Diagnosis not present

## 2020-04-14 DIAGNOSIS — E039 Hypothyroidism, unspecified: Secondary | ICD-10-CM | POA: Diagnosis not present

## 2020-04-14 DIAGNOSIS — D849 Immunodeficiency, unspecified: Secondary | ICD-10-CM | POA: Diagnosis not present

## 2020-04-14 DIAGNOSIS — Z792 Long term (current) use of antibiotics: Secondary | ICD-10-CM | POA: Diagnosis not present

## 2020-04-14 DIAGNOSIS — C9110 Chronic lymphocytic leukemia of B-cell type not having achieved remission: Principal | ICD-10-CM

## 2020-04-14 LAB — COMPREHENSIVE METABOLIC PANEL
ALBUMIN: 3.8 g/dL (ref 3.4–5.0)
ALKALINE PHOSPHATASE: 108 U/L (ref 46–116)
ALT (SGPT): 57 U/L — ABNORMAL HIGH (ref 10–49)
ANION GAP: 7 mmol/L (ref 5–14)
AST (SGOT): 32 U/L (ref <=34)
BILIRUBIN TOTAL: 0.7 mg/dL (ref 0.3–1.2)
BLOOD UREA NITROGEN: 25 mg/dL — ABNORMAL HIGH (ref 9–23)
BUN / CREAT RATIO: 23
CALCIUM: 9 mg/dL (ref 8.7–10.4)
CHLORIDE: 105 mmol/L (ref 98–107)
CO2: 25 mmol/L (ref 20.0–31.0)
CREATININE: 1.1 mg/dL — ABNORMAL HIGH
EGFR CKD-EPI AA FEMALE: 63 mL/min/{1.73_m2} (ref >=60)
EGFR CKD-EPI NON-AA FEMALE: 55 mL/min/{1.73_m2} — ABNORMAL LOW (ref >=60)
GLUCOSE RANDOM: 176 mg/dL (ref 70–179)
PROTEIN TOTAL: 6.2 g/dL (ref 5.7–8.2)
SODIUM: 137 mmol/L (ref 135–145)

## 2020-04-14 LAB — INR: Coagulation tissue factor induced.INR:RelTime:Pt:PPP:Qn:Coag: 1.46

## 2020-04-14 LAB — CBC W/ AUTO DIFF
BASOPHILS ABSOLUTE COUNT: 0 10*9/L (ref 0.0–0.1)
BASOPHILS RELATIVE PERCENT: 0.3 %
EOSINOPHILS RELATIVE PERCENT: 0.2 %
HEMATOCRIT: 22.6 % — ABNORMAL LOW (ref 36.0–46.0)
HEMOGLOBIN: 8.2 g/dL — ABNORMAL LOW (ref 12.0–16.0)
LARGE UNSTAINED CELLS: 12 % — ABNORMAL HIGH (ref 0–4)
LYMPHOCYTES ABSOLUTE COUNT: 0.1 10*9/L — ABNORMAL LOW (ref 1.5–5.0)
LYMPHOCYTES RELATIVE PERCENT: 19.8 %
MEAN CORPUSCULAR HEMOGLOBIN CONC: 36.1 g/dL (ref 31.0–37.0)
MEAN CORPUSCULAR VOLUME: 82.4 fL (ref 80.0–100.0)
MEAN PLATELET VOLUME: 9.3 fL (ref 7.0–10.0)
MONOCYTES ABSOLUTE COUNT: 0.1 10*9/L — ABNORMAL LOW (ref 0.2–0.8)
NEUTROPHILS ABSOLUTE COUNT: 0.2 10*9/L — CL (ref 2.0–7.5)
NEUTROPHILS RELATIVE PERCENT: 46.1 %
PLATELET COUNT: 19 10*9/L — ABNORMAL LOW (ref 150–440)
RED BLOOD CELL COUNT: 2.75 10*12/L — ABNORMAL LOW (ref 4.00–5.20)
WBC ADJUSTED: 0.5 10*9/L — ABNORMAL LOW (ref 4.5–11.0)

## 2020-04-14 LAB — CMV DNA, QUANTITATIVE, PCR

## 2020-04-14 LAB — TACROLIMUS, TROUGH: Lab: 4.8 — ABNORMAL LOW

## 2020-04-14 LAB — CMV VIRAL LD: Lab: NOT DETECTED

## 2020-04-14 LAB — SMEAR REVIEW

## 2020-04-14 LAB — GLUCOSE RANDOM: Glucose:MCnc:Pt:Ser/Plas:Qn:: 176

## 2020-04-14 LAB — EOSINOPHILS ABSOLUTE COUNT: Eosinophils:NCnc:Pt:Bld:Qn:Automated count: 0

## 2020-04-14 LAB — PROTIME-INR: PROTIME: 17 s — ABNORMAL HIGH (ref 10.3–13.4)

## 2020-04-14 MED ADMIN — heparin, porcine (PF) 100 unit/mL injection 200 Units: 200 [IU] | INTRAVENOUS | @ 15:00:00 | Stop: 2020-04-14

## 2020-04-14 NOTE — Unmapped (Addendum)
I will call you if I need to make changes to your tacrolimus dose.     Toniann Fail will be in touch about your calendar and next steps.  ------------------------------------------  COVID-19 information:  Given ongoing novel coronavirus (SARS-CoV-2/COVID-19), it is strongly recommended to avoid travel and crowds.  If you develop fever, cough, shortness of breath and/or have known exposure (close contact < 6 feet) with someone who has tested positive, please notify us immediately.    ??? Your best defenses are to stay home as much as possible and use good hand hygiene.  ??? We recommend that you wear a mask (N95 mask preferred) when leaving your home      Lab Results   Component Value Date    WBC 0.5 (L) 04/14/2020    HGB 8.2 (L) 04/14/2020    HCT 22.6 (L) 04/14/2020    PLT 19 (L) 04/14/2020     Lab Results   Component Value Date    NA 137 04/14/2020    K 3.5 04/14/2020    CL 105 04/14/2020    CO2 25.0 04/14/2020    BUN 25 (H) 04/14/2020    CREATININE 1.10 (H) 04/14/2020    GLU 176 04/14/2020    CALCIUM 9.0 04/14/2020    MG 1.5 (L) 04/12/2020    PHOS 3.6 03/01/2020     Lab Results   Component Value Date    BILITOT 0.7 04/14/2020    BILIDIR 0.30 03/01/2020    PROT 6.2 04/14/2020    ALBUMIN 3.8 04/14/2020    ALT 57 (H) 04/14/2020    AST 32 04/14/2020    ALKPHOS 108 04/14/2020    GGT 22 01/09/2020     Lab Results   Component Value Date    INR 1.46 04/14/2020    APTT 59.9 (H) 04/07/2020       For prescription refills:   For refills, please check your medication bottles to see if you have additional refills left. If so, please call your pharmacy and follow the directions to request a refill. If you do not have any refills left, please make a request during your clinic visit or by submitting a request through MyChart or by calling [phone]. Please allow 24 hours if your request is made during the week or 48 hours if requests are made on the weekends or holidays. --------------------------------------------------------------------------------------------------------------------  For appointments & questions Monday through Friday 8 AM-4:30 PM     Please call [phone] or Toll free [phone]    On Nights, Weekends and Holidays  Call [phone] and ask for the oncologist on call    Please visit PrivacyFever.cz, a resource created just for family members and caregivers.  This website lists support services, how and where to ask for help. It has tools to assist you as you help [place] care for your loved one.    [facility]  [address]  [place], [place] [address]  [email or web address]

## 2020-04-14 NOTE — Unmapped (Signed)
Bone Marrow Transplant and Cellular Therapy Program  Immunosuppressive Therapy Note    Susan Kane is a 59 y.o. 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 Susan Kane is currently day +91.    Current dose: tacrolimus 1 mg in AM and 1.5 mg in PM    Goal tacrolimus Level: 5-10 ng/mL    Resulted level: 4.8ng/mL @ 1111 (likely > 5 given later AM draw)    Lab Results   Component Value Date/Time    TACROLIMUS 4.8 (L) 04/14/2020 11:11 AM    TACROLIMUS 4.7 (L) 04/12/2020 09:58 AM    TACROLIMUS 14.1 04/09/2020 02:27 PM    TACROLIMUS 6.0 04/07/2020 10:32 AM     Lab Results   Component Value Date/Time    CREATININE 1.10 (H) 04/14/2020 11:11 AM    CREATININE 1.18 (H) 04/12/2020 09:58 AM    CREATININE 1.05 (H) 04/09/2020 09:46 AM    CREATININE 1.01 04/07/2020 10:32 AM      Assessment/Recommendation: Tacrolimus level is likely therapeutic. Scr is slightly elevated and LFTs/Tbili remain WNL. Given the above, will continue tacrolimus 1 mg PO qAM and 1.5 mg qPM and will recheck at next clinic visit to further assess trend and need for adjustment.      We will continue to monitor levels.  Patient will be followed for changes in renal and hepatic function, toxicity, and efficacy.     Bettey Costa, PharmD, BCPS, BCOP  BMT Clinical Pharmacist Practitioner

## 2020-04-14 NOTE — Unmapped (Signed)
Addended by: Lauralee Evener D on: 04/14/2020 04:36 PM     Modules accepted: Orders

## 2020-04-14 NOTE — Unmapped (Addendum)
BMT-CT Clinic Follow-up    Referring Physician: Jillyn Hidden Nida Boatman) [name], MD   Primary Care Provider: Thora Lance, MD  BMT Attending MD: Lanae Boast, MD    Disease: CLL  Current disease status: SD (stable disease)  Type of Transplant: RIC MUD Allo  Graft Source: Cryopreserved PBSCs  Transplant Day: +91    Study Participant: BMT CTN 1704: Composite Health Assessment Risk Model for Older Adults: Applying Pre-transplant Comorbidity, Geriatric Assessment and Biomarkers to Predict Non-Relapse Mortality After Allogeneic Transplant (CHARM).  ??  HPI:  Susan Kane??is a 59 y.o.??woman??with a diagnosis of CLL??and aplastic anemia who is now s/p RIC MUD AlloSCT. Her transplant course was complicated by delayed engraftment. ??She was initially discharged from the hospital on 02/23/2020. However was re-admitted on 02/27/20 with a fever and need for workup and IV cefepime. Infectious workup was unrevealing, IV cefepime was stopped on 03/02/20, and she was once again discharged on 03/03/20.  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  Currently planning for second transplant.      Interval History: Susan Kane presents with her husband for routine clinic follow up today. She reports loose stools, about 2 per day, that is associated with cramping. Has not taken any pain medications. Will attempt to get a CDiff sample if she is able to produce a specimen today. Denies fever or chills. No n/v. Eating small meals and snacks and drinking about a liter daily. No chest pain or SOB. No rashes, itching, no infectious or bleeding symptoms.      Oncology History Overview Note   CLL:    Summary as per Dr. Kalman Drape most recent note with minor edits/additions from my review of records    1.??CLL?????diagnosed in August 2010, flow cytometry consistent with CLL  ?? Enlarged left??inguinal lymph node January 2019,??small??neck/axillary nodes and palpable splenomegaly 09/11/2017  ?? CTs??on 09/17/2017-3 cm necrotic appearing lymph node in the left inguinal region, borderline enlarged pelvic/retroperitoneal, chest, and axillary nodes. ??Mild splenomegaly.  ?? Ultrasound-guided biopsy of the left inguinal lymph node 09/18/2017, slightly purulent fluid aspirated, core biopsy is consistent with an atypical lymphoid proliferation???extensive necrosis with surrounding epithelioid histiocytes, limited intact lymphoid tissue involved with CLL  ?? Incisional biopsy of a necrotic/purulent left inguinal lymph node on 10/01/2017???extensive necrosis with granulomatous inflammation, small amount of viable lymphoid tissue involved with CLL, AFB and fungal stains negative  ?? Peripheral blood FISH analysis 02/05/2018??? +deletion 13q14, no evidence of p53 (17p13) deletion, no evidence of 11q22??deletion  - normal karyotype (46,XX)  ?? Bone marrow biopsy 02/26/2018???hypercellular marrow with extensive involvement by CLL, lymphocytes represent??85% of all cells  ?? Ibrutinib initiated 04/03/2018  ?? Ibrutinib placed on hold 04/11/2018 due to onset of arthralgias  ?? Ibrutinib resumed 04/16/2018, discontinued 04/25/2018 secondary to severe arthralgias/arthritis  ?? Ibrutinib resumed at a dose of 140 mg daily 05/03/2018  ?? Ibrutinib dose adjusted to 140 mg alternating with 280 mg 06/25/2018  ?? 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 ??Augmentin prescribed.  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    Baseline BM bx reviewed at [facility]  Final Diagnosis   (Outside Case #:  [id number], dated 02/26/2018)  Bone marrow,  aspiration and biopsy  -  Hypercellular bone marrow (80%) with extensive involvement by chronic lymphocytic leukemia (87% lymphocytes by manual aspirate differential)  (See Comment)  -  By outside report, cytogenetic results are normal     Karyotype: 76, XX and FISH with 13q del but no 11q or 17p del    Repeat BM bx: (done after being off ibrutinib x1 month)  80% involvement by CLL    08/13/18: Presents to [facility] to discuss plan of care     Chronic lymphocytic leukemia (CLL), B-cell (CMS-HCC)   07/12/2018 Initial Diagnosis    Chronic lymphocytic leukemia (CLL), B-cell (CMS-HCC)     08/26/2018 -  Chemotherapy    Acalabrutinib started (approximate date)     11/14/2018 -  Chemotherapy    Acalabrutinib discontinued due to progressive bone marrow failure     12/06/2018 -  Chemotherapy    Ritux x1 given due to CLL still making up majority of BM cellularity (though in the setting of near aplasia of rest of TLH)     03/19/2019 -  Chemotherapy    Equine ATG / CsA for aplastic anemia (suspect 2/t acalabrutinib)     Chronic lymphocytic leukemia of B-cell type not having achieved remission (CMS-HCC)   01/21/2019 Initial Diagnosis    Chronic lymphocytic leukemia of B-cell type not having achieved remission (CMS-HCC)     01/01/2020 -  Chemotherapy    BMT IP BENDAMUSTINE / FLUDARABINE / RITUXIMAB (OP RITUXIMAB Days -13 & -6) (MUD)  bendamustine 130 mg/m2 IV on days -5, -4, -3   fludarabine 30 mg/m2 IV on days -5, -4, -3   riTUXimab 375 mg/m2 IV on day -13 as a OP treatment day, then 1,000 mg/m2 on days -6 as a OP treatment day, +1, +8   tacrolimus, 0.045 mg/kg PO BID on days -3 and -2 then  0.03 mg/kg PO BID starting on day -1   methotrexate 5 mg/m2 IV on days +1, +3, +6, +11   tbo-filgrastim 300 mcg (<= 75 kg) / 480 mcg (> 75 kg) q24h starting on day +7        Patient Active Problem List   Diagnosis   ??? Chronic lymphocytic leukemia (CLL), B-cell (CMS-HCC)   ??? Hypogammaglobulinemia (CMS-HCC)   ??? Chronic lymphocytic leukemia of B-cell type not having achieved remission (CMS-HCC)   ??? Aplastic anemia (CMS-HCC)   ??? 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 Drug-induced nausea and vomiting   ??? Stem cell transplant candidate   ??? Chemotherapy induced neutropenia (CMS-HCC)   ??? History of allogeneic stem cell transplant (CMS-HCC)   ??? Osteopenia   ??? Neutropenia, drug-induced (CMS-HCC)   ??? Immunosuppressed status (CMS-HCC)   ??? Bone marrow hypocellularity   ??? Neutropenia (CMS-HCC)     Review of Systems:  A full system review was performed and was negative except as noted in the above interval history.    Reviewed and updated past medical, surgical, social, and family history as appropriate.      No Known Allergies  Current Outpatient Medications   Medication Sig Dispense Refill   ??? calcium carbonate-vitamin D3 600 mg(1,500mg ) -800 unit Tab Take 1 tablet by mouth Two (2) times a day. (Patient not taking: Reported on 03/05/2020) 60 tablet 5   ??? cefdinir (OMNICEF) 300 MG capsule Take 1 capsule (300 mg total) by mouth Two (2) times a day. 60 capsule 1   ??? famotidine (PEPCID) 20 MG tablet Take 1 tablet (20 mg total) by  mouth two (2) times a day as needed for heartburn. 60 tablet 0   ??? folic acid (FOLVITE) 1 MG tablet Take 1 tablet (1 mg total) by mouth at bedtime. 30 tablet 5   ??? levothyroxine (SYNTHROID) 100 MCG tablet Take 1 tablet (100 mcg total) by mouth daily. 30 tablet 5   ??? lithium (LITHOBID) 300 MG ER tablet Take 1 tablet (300 mg total) by mouth at bedtime. 30 tablet 5   ??? magnesium oxide-Mg AA chelate (MAGNESIUM, AMINO ACID CHELATE,) 133 mg Tab Take 1 tablet by mouth daily. 100 tablet 2   ??? ondansetron (ZOFRAN) 8 MG tablet Take 1 tablet (8 mg total) by mouth every twelve (12) hours as needed for nausea. 30 tablet 2   ??? polyethylene glycol (MIRALAX) 17 gram packet Take 17 g by mouth daily as needed (constipation).     ??? posaconazole (NOXAFIL) 100 mg TbEC delayed released tablet Take 3 tablets (300 mg total) by mouth daily. 90 tablet 1   ??? tacrolimus (PROGRAF) 0.5 MG capsule Take 2 capsules (1 mg total) by mouth daily AND 3 capsules (1.5 mg total) nightly. 150 capsule 5   ??? TRINTELLIX 10 mg tablet Take 1 tablet (10 mg total) by mouth at bedtime. 30 tablet 5   ??? valACYclovir (VALTREX) 500 MG tablet Take 1 tablet (500 mg total) by mouth daily. 30 tablet 11   ??? zolpidem (AMBIEN) 5 MG tablet Take 1 to 2 tablets (5 mg-10 mg) as needed for insomnia. 60 tablet 0     No current facility-administered medications for this visit.     Objective:  Vitals:    04/14/20 1127   BP: 133/76   Pulse: 96   Resp: 18   Temp: 36.5 ??C (97.7 ??F)   TempSrc: Temporal   SpO2: 100%   Weight: 54.5 kg (120 lb 3.2 oz)   Height: 165.1 cm (5' 5)     Physical Exam: essentially unchanged from previously documented physical exam  General appearance - Thin WF, no distress   Mental status - affect appropriate to situation   Eyes - conjunctivae clear, sclera anicteric   ENT - MMM, no oral ulcerations or thrush  Neck - supple, no palpable LAD  Chest - lungs are clear to auscultation bilaterally without wheezes or crackles  Heart - regular rate and rhythm without murmur, rub, or gallop   Abdomen - soft, bowel sounds within normal limits, no masses, no tenderness, no hepatosplenomegaly   Neurological - motor and sensory grossly normal bilaterally  Extremities - no pedal edema noted  Skin - no skin breakdown noted, no rash, petechiae noted on upper chest   Venous access - line site is non-tender, no erythema/drainage    Karnofsky/Lansky Performance Status  80, Normal activity with effort; some signs or symptoms of disease (ECOG equivalent 1)    Test Results:   Reviewed in EPIC. Abnormal values discussed below.     Assessment/Plan:  Susan Kane is 59 yo woman with CLL as well as AA/bone marrow failure.  ??  CLL: CT C/A/P on 10/10/19 revealed no evidence of LAD or organomegaly. Possible endometrial fluid or thickening in the fundus of the uterus.  10/07/19 BMBx: Scant bone marrow sampling (<5% cellular) essentially devoid of hematopoietic elements.  - ??Persistent CLL representing 23% of marrow cellularity by flow cytometric analysis.  -  Corresponding CBC: WBC 1.0, ANC 0.1.  ????????  ??  12/26/19: BmBx:   - ??Hypocellular (20% overall) involved by chronic lymphocytic leukemia/small lymphocytic  lymphoma, representing approximately 20% of marrow cells by PAX5 immunohistochemistry.  - Normal karyotype: 46,XX[20]    02/10/20: BmBx:  -Markedly hypocellular bone marrow (< 5%) with essentially absent hematopoiesis. Normal Recipient and Donor karyotype:  46,XX[2]//46,XY[7].     Repeat bone marrow biopsy done 9/15 and is variably cellular with no evidence of CLL.     BMT:??SD (stable disease)  HCT-CI (age adjusted) 4??(active CLL, psych, age adjusted)    Conditioning from 1st transplant:  1. Rituximab 375 mg/m2 on day -13 and 1000 mg/m2 on D-6, +1, and +8  2. Bendamustine 130 mg/m2 IV daily over 10 minutes on D-5 through -3  3. Fludarabine 30 mg/m2 IV daily over 30 minutes on D-5 through -3  ??  Donor: 10/10, ABO identical; O - , CMV negative  Cell dose: 5.51x10^6 cells/kg    Engraftment:   - Granix D+12 through WBC recovery (ANC 1.0 x 2 days or 3.0 x 1 day)  - Viral studies showed weakly positive EBV, CMV, HHV-6, plan to monitor for now  - 02/10/20 D+30 BMBx: markedly hypocellular marrow (<5%) with essentially absent hematopoiesis.     Chimerisms:     02/09/2020 00:23 02/10/2020 14:45 03/08/2020 13:49 03/08/2020 13:49 03/15/2020 09:38 03/15/2020 09:38 04/07/2020 10:30 04/07/2020 10:30 04/07/2020 12:45 04/07/2020 12:45   Specimen Type Blood Bone Marrow Blood Blood Blood Blood Blood Blood Bone Marrow Bone Marrow   T-Cell (CD3) Recipient:    >95  >95  >95  >95   T-Cell (CD3) Donor:    <5  <5  <5  <5   Unfract. Recipient: 37 17 14  15  28  10     Unfract. Donor: 63 83 86  85  72  90      GvHD prophylaxis:   1. Tacrolimus goal 5-10, dose adjustments per BMT pharmacy.   ??** Current dose 1 mg in the am and 1.5mg  qpm  2. Completed Methotrexate 5 mg/m2 on D+1, +3, +6 and +11  3. rATG 1 mg/kg on D-2 and -1 included  ??  Heme:  Transfusion criteria: ABO identical donor [O-]   - Standard txf criteria: 1 unit of PRBCs for Hgb<7 and 1 unit of platelets for Plt <10K or bleeding.     ** No history of transfusion reactions.   - Granix for ANC of 1 or less [tried Udenyca but did not get a lasting result]    ** Given 8/27, 8/30, 9/3, 9/8, 9/10, 04/05/20, 9/17 (holding off as she has not had recent response)  - Has been requiring PRBCs and platelets weekly at least.  - . Stopped promacta 9/22/21per Dr. Oswald Hillock. Will plan on BMBx in 7-10 days to eval marrow after stopping.    **No transfusions required today.    ID:????  Prophylaxis:  - Antiviral: Valtrex 500mg  daily.   - Antifungal: Posaconazole 300 mg daily  - Antibacterial:??Starting Cefdinir prophy [04/05/20]     - PJP: Montly pentamidine [7/31, 03/31/20].    CMV:   03/02/20: CMV 884. Valganciclovir started.    03/08/20: CMV decreased after 1 week of therapy to 140.  03/17/20: Last CMV <50. Decreased valcyte to 450 mg BID.   ** Letermovir denied. Appeal in progress.    03/22/20: CMV not detected. Maintenance dosing ends on 9/8  03/31/20: D/C Valcyte. CMV negative.   ??  HHV-6:   - Peaked on PCR at 41.2, now negative.   ??  EBV:   - Weekly, negative 03/31/20   ??  GI:??  -Decreased appetite.     **  Weight is stable.   ??  Loose stools:  -9/22: with cramping. Will send CDiff if able to produce a sample in clinic today.    Nausea:  - Zofran 8mg  prn [taking bid]  ??  Renal:   04/14/20: SCr WNL  ??  FEN:   - Continue folic acid.    Hypomagnesemia:  - Mag Chelate 1 tablet daily.   ??  Endocrine:  Hypothyroid:??  - Continue Synthroid daily.   - TSH, T3, T4 - normal   ??  Hepatic:??  - VOD prophylaxis not warranted as non-myeloablative regimen.   ??  Hepatocellular liver injury pattern:  - Mild increase in ALT, and AlkPhos, likely secondary to posaconazole.     ** Overall stable.   ??  Neuro/Pain:??  - No active issues.    Bone Health:   03/05/20: DEXA scan results:  - spine L1 - L4 T score -1.4  - L proximal femur T score -1.9  - L femoral neck T score -1.8  Based on DEXA scan, patient has osteopenia of spine, femur and femoral neck. Recommend administering zoledronic acid 4 mg IV every 3 months x 2 doses in addition to calcium and vitamin D supplementation 600 mg/ 800 units twice daily.   - Zometa given [8/18], final dose due in November.  ??  Psych:??Has a home psychiatrist. Alanson Aly [phone].   Depression/Anxiety:   - Per outpt note pt was taking Xanax 0.25 mg BID PRN for anxiety.  - Trintellix 10mg  nightly  - Lithium 300mg  nightly   04/02/20: Requested lithium level [last checked in June]     ** Dr. Jennelle Human was updated with results [04/05/20].    ??  Insomnia:  - Ambien 10mg  nightly PRN  ??  Caregiver/Lodging:  - Spouse [name] is her primary caregiver  - Staying at local rental.     Summary:  - Pancytopenia with neutropenia. No transfusions required today.   - Promacta stopped today. Also do not given anymore granix so that we can perform another bone marrow biopsy off graft stimulating drugs.   - Will need CDiff testing if able to produce sample today  - RTC Friday as scheduled  - Repeat INR 1.4. Will recheck next week.  - Pre transplant testing ongoing to get ready for 2nd transplant.      Myra Rude, ANP  Bone Marrow Transplant and Cellular Therapy Program    Future Appointments   Date Time Provider Department Center   04/16/2020 10:30 AM ONCBMT LABS [facility]   04/16/2020 11:00 AM ONCBMT APP A [facility]   04/16/2020 12:00 PM ONCINF CHAIR 04 [id number]   04/21/2020 10:45 AM ONCBMT LABS [facility]   04/21/2020 11:15 AM ONCBMT APP B [facility]   04/21/2020 12:30 PM ONCBMT PROCEDURES [id number]   04/27/2020 10:15 AM ONCBMT LABS [facility]   04/27/2020 10:45 AM Artelia Laroche, MD [facility]   04/28/2020 11:30 AM ONCINF CHAIR 06 [id number]   06/08/2020 11:00 AM ONCINF CHAIR 04 [id number]     I personally spent 48 minutes face-to-face and non-face-to-face in the care of this patient, which includes all pre, intra, and post visit time on the date of service.

## 2020-04-15 LAB — EBV VIRAL LOAD RESULT: Lab: NOT DETECTED

## 2020-04-16 ENCOUNTER — Encounter: Admit: 2020-04-16 | Discharge: 2020-04-17 | Payer: PRIVATE HEALTH INSURANCE

## 2020-04-16 DIAGNOSIS — C9110 Chronic lymphocytic leukemia of B-cell type not having achieved remission: Secondary | ICD-10-CM | POA: Diagnosis not present

## 2020-04-16 DIAGNOSIS — C9111 Chronic lymphocytic leukemia of B-cell type in remission: Secondary | ICD-10-CM | POA: Diagnosis not present

## 2020-04-16 DIAGNOSIS — Z9484 Stem cells transplant status: Secondary | ICD-10-CM | POA: Diagnosis not present

## 2020-04-16 DIAGNOSIS — D849 Immunodeficiency, unspecified: Secondary | ICD-10-CM | POA: Diagnosis not present

## 2020-04-16 DIAGNOSIS — Z792 Long term (current) use of antibiotics: Secondary | ICD-10-CM | POA: Diagnosis not present

## 2020-04-16 DIAGNOSIS — E039 Hypothyroidism, unspecified: Secondary | ICD-10-CM | POA: Diagnosis not present

## 2020-04-16 DIAGNOSIS — R791 Abnormal coagulation profile: Secondary | ICD-10-CM | POA: Diagnosis not present

## 2020-04-16 DIAGNOSIS — Z9481 Bone marrow transplant status: Secondary | ICD-10-CM | POA: Diagnosis not present

## 2020-04-16 DIAGNOSIS — D61818 Other pancytopenia: Principal | ICD-10-CM

## 2020-04-16 LAB — COMPREHENSIVE METABOLIC PANEL
ALBUMIN: 3.8 g/dL (ref 3.4–5.0)
ALKALINE PHOSPHATASE: 102 U/L (ref 46–116)
ALT (SGPT): 57 U/L — ABNORMAL HIGH (ref 10–49)
ANION GAP: 7 mmol/L (ref 5–14)
AST (SGOT): 30 U/L (ref <=34)
BILIRUBIN TOTAL: 0.6 mg/dL (ref 0.3–1.2)
BLOOD UREA NITROGEN: 25 mg/dL — ABNORMAL HIGH (ref 9–23)
BUN / CREAT RATIO: 21
CALCIUM: 9.1 mg/dL (ref 8.7–10.4)
CHLORIDE: 108 mmol/L — ABNORMAL HIGH (ref 98–107)
CREATININE: 1.17 mg/dL — ABNORMAL HIGH
EGFR CKD-EPI AA FEMALE: 59 mL/min/{1.73_m2} — ABNORMAL LOW (ref >=60)
EGFR CKD-EPI NON-AA FEMALE: 51 mL/min/{1.73_m2} — ABNORMAL LOW (ref >=60)
POTASSIUM: 3.5 mmol/L (ref 3.4–4.5)
PROTEIN TOTAL: 5.9 g/dL (ref 5.7–8.2)
SODIUM: 139 mmol/L (ref 135–145)

## 2020-04-16 LAB — CBC W/ AUTO DIFF
BASOPHILS ABSOLUTE COUNT: 0 10*9/L (ref 0.0–0.1)
BASOPHILS RELATIVE PERCENT: 0 %
EOSINOPHILS ABSOLUTE COUNT: 0 10*9/L (ref 0.0–0.4)
EOSINOPHILS RELATIVE PERCENT: 0.4 %
HEMATOCRIT: 21 % — ABNORMAL LOW (ref 36.0–46.0)
HEMOGLOBIN: 7.3 g/dL — ABNORMAL LOW (ref 12.0–16.0)
LARGE UNSTAINED CELLS: 16 % — ABNORMAL HIGH (ref 0–4)
LYMPHOCYTES ABSOLUTE COUNT: 0.1 10*9/L — ABNORMAL LOW (ref 1.5–5.0)
LYMPHOCYTES RELATIVE PERCENT: 19.3 %
MEAN CORPUSCULAR HEMOGLOBIN CONC: 35 g/dL (ref 31.0–37.0)
MEAN CORPUSCULAR VOLUME: 81.9 fL (ref 80.0–100.0)
MEAN PLATELET VOLUME: 8.1 fL (ref 7.0–10.0)
MONOCYTES ABSOLUTE COUNT: 0.1 10*9/L — ABNORMAL LOW (ref 0.2–0.8)
MONOCYTES RELATIVE PERCENT: 21.1 %
NEUTROPHILS ABSOLUTE COUNT: 0.2 10*9/L — CL (ref 2.0–7.5)
NEUTROPHILS RELATIVE PERCENT: 43.6 %
PLATELET COUNT: 11 10*9/L — ABNORMAL LOW (ref 150–440)
RED BLOOD CELL COUNT: 2.56 10*12/L — ABNORMAL LOW (ref 4.00–5.20)
RED CELL DISTRIBUTION WIDTH: 15.2 % — ABNORMAL HIGH (ref 12.0–15.0)
WBC ADJUSTED: 0.4 10*9/L — CL (ref 4.5–11.0)

## 2020-04-16 LAB — SLIDE REVIEW

## 2020-04-16 LAB — SODIUM: Sodium:SCnc:Pt:Ser/Plas:Qn:: 139

## 2020-04-16 LAB — PLATELET COUNT: Platelets:NCnc:Pt:Bld:Qn:Automated count: 26 — ABNORMAL LOW

## 2020-04-16 LAB — LARGE UNSTAINED CELLS: Lab: 16 — ABNORMAL HIGH

## 2020-04-16 LAB — TACROLIMUS, TROUGH: Lab: 4.4 — ABNORMAL LOW

## 2020-04-16 LAB — SMEAR REVIEW

## 2020-04-16 LAB — MAGNESIUM: Magnesium:MCnc:Pt:Ser/Plas:Qn:: 1.5 — ABNORMAL LOW

## 2020-04-16 MED ORDER — PHYTONADIONE (VITAMIN K1) 5 MG TABLET
ORAL_TABLET | Freq: Every day | ORAL | 0 refills | 3 days | Status: CP
Start: 2020-04-16 — End: 2020-04-19
  Filled 2020-04-16: qty 3, 3d supply, fill #0

## 2020-04-16 MED ADMIN — heparin, porcine (PF) 100 unit/mL injection 200 Units: 200 [IU] | INTRAVENOUS | @ 15:00:00 | Stop: 2020-04-16

## 2020-04-16 MED ADMIN — heparin, porcine (PF) 100 unit/mL injection 200 Units: 200 [IU] | INTRAVENOUS | @ 21:00:00 | Stop: 2020-04-17

## 2020-04-16 MED FILL — ONDANSETRON HCL 8 MG TABLET: 15 days supply | Qty: 30 | Fill #2 | Status: AC

## 2020-04-16 MED FILL — LEVOTHYROXINE 100 MCG TABLET: ORAL | 30 days supply | Qty: 30 | Fill #1

## 2020-04-16 MED FILL — LEVOTHYROXINE 100 MCG TABLET: 30 days supply | Qty: 30 | Fill #1 | Status: AC

## 2020-04-16 MED FILL — CEFDINIR 300 MG CAPSULE: 5 days supply | Qty: 10 | Fill #1 | Status: AC

## 2020-04-16 MED FILL — ONDANSETRON HCL 8 MG TABLET: ORAL | 15 days supply | Qty: 30 | Fill #2

## 2020-04-16 MED FILL — PHYTONADIONE (VITAMIN K1) 5 MG TABLET: 3 days supply | Qty: 3 | Fill #0 | Status: AC

## 2020-04-16 MED FILL — CEFDINIR 300 MG CAPSULE: ORAL | 5 days supply | Qty: 10 | Fill #1

## 2020-04-16 NOTE — Unmapped (Signed)
BMT-CT Clinic Follow-up    Referring Physician: Jillyn Hidden Nida Boatman) [name], MD   Primary Care Provider: Thora Lance, MD  BMT Attending MD: Lanae Boast, MD    Disease: CLL  Current disease status: SD (stable disease)  Type of Transplant: RIC MUD Allo  Graft Source: Cryopreserved PBSCs  Transplant Day: +93    Study Participant: BMT CTN 1704: Composite Health Assessment Risk Model for Older Adults: Applying Pre-transplant Comorbidity, Geriatric Assessment and Biomarkers to Predict Non-Relapse Mortality After Allogeneic Transplant (CHARM).  ??  HPI:  Susan Kane??is a 59 y.o.??woman??with a diagnosis of CLL??and aplastic anemia who is now s/p RIC MUD AlloSCT. Her transplant course was complicated by delayed engraftment. ??She was initially discharged from the hospital on 02/23/2020. However was re-admitted on 02/27/20 with a fever and need for workup and IV cefepime. Infectious workup was unrevealing, IV cefepime was stopped on 03/02/20, and she was once again discharged on 03/03/20.  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  Currently planning for second transplant.      Interval History: [name] presents with her husband for routine clinic follow up today. She reports less diarrhea. She has thrown up mucus x2 since her last visit. This is not typical for her. She is not sure what the trigger was but she was not really feeling nauseated at the time.  She has been eating an drinking well. She has no skin rash. Skin is dry. She has a stable weight.   She has had a hard time getting line supplies.  She needs dressing change kits, caps and heparin flushes.  [facility] is the provider.  She was planned to be admitted for stem cell transplant #2 this weekend.  This has been delayed as we are going to reassess her marrow next week.  She has held granix and [address].      Oncology History Overview Note   CLL:    Summary as per Dr. Kalman Drape most recent note with minor edits/additions from my review of records    1.??CLL?????diagnosed in August 2010, flow cytometry consistent with CLL  ?? Enlarged left??inguinal lymph node January 2019,??small??neck/axillary nodes and palpable splenomegaly 09/11/2017  ?? CTs??on 09/17/2017-3 cm necrotic appearing lymph node in the left inguinal region, borderline enlarged pelvic/retroperitoneal, chest, and axillary nodes. ??Mild splenomegaly.  ?? Ultrasound-guided biopsy of the left inguinal lymph node 09/18/2017, slightly purulent fluid aspirated, core biopsy is consistent with an atypical lymphoid proliferation???extensive necrosis with surrounding epithelioid histiocytes, limited intact lymphoid tissue involved with CLL  ?? Incisional biopsy of a necrotic/purulent left inguinal lymph node on 10/01/2017???extensive necrosis with granulomatous inflammation, small amount of viable lymphoid tissue involved with CLL, AFB and fungal stains negative  ?? Peripheral blood FISH analysis 02/05/2018??? +deletion 13q14, no evidence of p53 (17p13) deletion, no evidence of 11q22??deletion  - normal karyotype (46,XX)  ?? Bone marrow biopsy 02/26/2018???hypercellular marrow with extensive involvement by CLL, lymphocytes represent??85% of all cells  ?? Ibrutinib initiated 04/03/2018  ?? Ibrutinib placed on hold 04/11/2018 due to onset of arthralgias  ?? Ibrutinib resumed 04/16/2018, discontinued 04/25/2018 secondary to severe arthralgias/arthritis  ?? Ibrutinib resumed at a dose of 140 mg daily 05/03/2018  ?? Ibrutinib dose adjusted to 140 mg alternating with 280 mg 06/25/2018  ?? 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 ??Augmentin prescribed.  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    Baseline BM bx reviewed at [facility]  Final Diagnosis   (Outside Case #:  [id number], dated 02/26/2018)  Bone marrow, aspiration and biopsy  -  Hypercellular bone marrow (80%) with extensive involvement by chronic lymphocytic leukemia (87% lymphocytes by manual aspirate differential)  (See Comment)  -  By outside report, cytogenetic results are normal     Karyotype: 2, XX and FISH with 13q del but no 11q or 17p del    Repeat BM bx: (done after being off ibrutinib x1 month)  80% involvement by CLL    08/13/18: Presents to [facility] to discuss plan of care     Chronic lymphocytic leukemia (CLL), B-cell (CMS-HCC)   07/12/2018 Initial Diagnosis    Chronic lymphocytic leukemia (CLL), B-cell (CMS-HCC)     08/26/2018 -  Chemotherapy    Acalabrutinib started (approximate date)     11/14/2018 -  Chemotherapy    Acalabrutinib discontinued due to progressive bone marrow failure     12/06/2018 -  Chemotherapy    Ritux x1 given due to CLL still making up majority of BM cellularity (though in the setting of near aplasia of rest of TLH)     03/19/2019 -  Chemotherapy    Equine ATG / CsA for aplastic anemia (suspect 2/t acalabrutinib)     Chronic lymphocytic leukemia of B-cell type not having achieved remission (CMS-HCC)   01/21/2019 Initial Diagnosis    Chronic lymphocytic leukemia of B-cell type not having achieved remission (CMS-HCC)     01/01/2020 -  Chemotherapy    BMT IP BENDAMUSTINE / FLUDARABINE / RITUXIMAB (OP RITUXIMAB Days -13 & -6) (MUD)  bendamustine 130 mg/m2 IV on days -5, -4, -3   fludarabine 30 mg/m2 IV on days -5, -4, -3   riTUXimab 375 mg/m2 IV on day -13 as a OP treatment day, then 1,000 mg/m2 on days -6 as a OP treatment day, +1, +8   tacrolimus, 0.045 mg/kg PO BID on days -3 and -2 then  0.03 mg/kg PO BID starting on day -1   methotrexate 5 mg/m2 IV on days +1, +3, +6, +11   tbo-filgrastim 300 mcg (<= 75 kg) / 480 mcg (> 75 kg) q24h starting on day +7        Patient Active Problem List   Diagnosis   ??? Chronic lymphocytic leukemia (CLL), B-cell (CMS-HCC)   ??? Hypogammaglobulinemia (CMS-HCC)   ??? Chronic lymphocytic leukemia of B-cell type not having achieved remission (CMS-HCC)   ??? Aplastic anemia (CMS-HCC)   ??? 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 Drug-induced nausea and vomiting   ??? Stem cell transplant candidate   ??? Chemotherapy induced neutropenia (CMS-HCC)   ??? History of allogeneic stem cell transplant (CMS-HCC)   ??? Osteopenia   ??? Neutropenia, drug-induced (CMS-HCC)   ??? Immunosuppressed status (CMS-HCC)   ??? Bone marrow hypocellularity   ??? Neutropenia (CMS-HCC)     Review of Systems:  A full system review was performed and was negative except as noted in the above interval history.    Reviewed and updated past medical, surgical, social, and family history as appropriate.      No Known Allergies  Current Outpatient Medications   Medication Sig Dispense Refill   ??? calcium carbonate-vitamin D3 600 mg(1,500mg ) -800 unit Tab  Take 1 tablet by mouth Two (2) times a day. (Patient not taking: Reported on 03/05/2020) 60 tablet 5   ??? cefdinir (OMNICEF) 300 MG capsule Take 1 capsule (300 mg total) by mouth Two (2) times a day. 60 capsule 1   ??? famotidine (PEPCID) 20 MG tablet Take 1 tablet (20 mg total) by mouth two (2) times a day as needed for heartburn. 60 tablet 0   ??? folic acid (FOLVITE) 1 MG tablet Take 1 tablet (1 mg total) by mouth at bedtime. 30 tablet 5   ??? levothyroxine (SYNTHROID) 100 MCG tablet Take 1 tablet (100 mcg total) by mouth daily. 30 tablet 5   ??? lithium (LITHOBID) 300 MG ER tablet Take 1 tablet (300 mg total) by mouth at bedtime. 30 tablet 5   ??? magnesium oxide-Mg AA chelate (MAGNESIUM, AMINO ACID CHELATE,) 133 mg Tab Take 1 tablet by mouth daily. 100 tablet 2   ??? ondansetron (ZOFRAN) 8 MG tablet Take 1 tablet (8 mg total) by mouth every twelve (12) hours as needed for nausea. 30 tablet 2   ??? polyethylene glycol (MIRALAX) 17 gram packet Take 17 g by mouth daily as needed (constipation).     ??? posaconazole (NOXAFIL) 100 mg TbEC delayed released tablet Take 3 tablets (300 mg total) by mouth daily. 90 tablet 1   ??? tacrolimus (PROGRAF) 0.5 MG capsule Take 2 capsules (1 mg total) by mouth daily AND 3 capsules (1.5 mg total) nightly. 150 capsule 5   ??? TRINTELLIX 10 mg tablet Take 1 tablet (10 mg total) by mouth at bedtime. 30 tablet 5   ??? valACYclovir (VALTREX) 500 MG tablet Take 1 tablet (500 mg total) by mouth daily. 30 tablet 11   ??? zolpidem (AMBIEN) 5 MG tablet Take 1 to 2 tablets (5 mg-10 mg) as needed for insomnia. 60 tablet 0     No current facility-administered medications for this visit.     Objective:  Vitals:    04/16/20 1115   BP: 127/58   Pulse: 86   Resp: 18   Temp: 36.6 ??C (97.8 ??F)   TempSrc: Oral   SpO2: 99%   Weight: 54.6 kg (120 lb 6.4 oz)   Height: 165.1 cm (5' 5)     Physical Exam: essentially unchanged from previously documented physical exam  General appearance - Thin WF, no distress, bronze coloring.    Mental status - affect appropriate to situation   Eyes - conjunctivae clear, sclera anicteric   ENT - MMM, no oral ulcerations or thrush  Neck - supple, no palpable LAD  Chest - lungs are clear to auscultation bilaterally without wheezes or crackles  Heart - regular rate and rhythm without murmur, rub, or gallop   Abdomen - soft, bowel sounds within normal limits, no masses, no tenderness, no hepatosplenomegaly   Neurological - motor and sensory grossly normal bilaterally  Extremities - no pedal edema noted  Skin - no skin breakdown noted, no rash, petechiae noted on upper chest   Venous access - line site is non-tender, no erythema/drainage    Karnofsky/Lansky Performance Status  80, Normal activity with effort; some signs or symptoms of disease (ECOG equivalent 1)    Test Results:   Reviewed in EPIC. Abnormal values discussed below.     Assessment/Plan:  Mrs. Snellgrove is 59 yo woman with CLL as well as AA/bone marrow failure.  ??  CLL: CT C/A/P on 10/10/19 revealed no evidence of LAD or organomegaly. Possible endometrial fluid or thickening  in the fundus of the uterus.  10/07/19 BMBx: Scant bone marrow sampling (<5% cellular) essentially devoid of hematopoietic elements.  - ??Persistent CLL representing 23% of marrow cellularity by flow cytometric analysis.  -  Corresponding CBC: WBC 1.0, ANC 0.1.  ????????  ??  12/26/19: BmBx:   - ??Hypocellular (20% overall) involved by chronic lymphocytic leukemia/small lymphocytic lymphoma, representing approximately 20% of marrow cells by PAX5 immunohistochemistry.  - Normal karyotype: 46,XX[20]    02/10/20: BmBx:  -Markedly hypocellular bone marrow (< 5%) with essentially absent hematopoiesis. Normal Recipient and Donor karyotype:  46,XX[2]//46,XY[7].     Repeat bone marrow biopsy done 9/15 and is variably cellular with no evidence of CLL. Plan to repeat on 9/29.    BMT:??SD (stable disease)  HCT-CI (age adjusted) 5??(active CLL, psych, age adjusted)    Conditioning from 1st transplant:  1. Rituximab 375 mg/m2 on day -13 and 1000 mg/m2 on D-6, +1, and +8  2. Bendamustine 130 mg/m2 IV daily over 10 minutes on D-5 through -3  3. Fludarabine 30 mg/m2 IV daily over 30 minutes on D-5 through -3  ??  Donor: 10/10, ABO identical; O - , CMV negative  Cell dose: 5.51x10^6 cells/kg    Engraftment:   - Granix D+12 through WBC recovery (ANC 1.0 x 2 days or 3.0 x 1 day)  - Viral studies showed weakly positive EBV, CMV, HHV-6, plan to monitor for now  - 02/10/20 D+30 BMBx: markedly hypocellular marrow (<5%) with essentially absent hematopoiesis.     Chimerisms:     02/09/2020 00:23 02/10/2020 14:45 03/08/2020 13:49 03/08/2020 13:49 03/15/2020 09:38 03/15/2020 09:38 04/07/2020 10:30 04/07/2020 10:30 04/07/2020 12:45 04/07/2020 12:45   Specimen Type Blood Bone Marrow Blood Blood Blood Blood Blood Blood Bone Marrow Bone Marrow   T-Cell (CD3) Recipient:    >95  >95  >95  >95   T-Cell (CD3) Donor:    <5 <5  <5  <5   Unfract. Recipient: 37 17 14  15  28  10     Unfract. Donor: 63 83 86  85  72  90      GvHD prophylaxis:   1. Tacrolimus goal 5-10, dose adjustments per BMT pharmacy.   ??** Current dose 1 mg in the am and 1.5mg  qpm  2. Completed Methotrexate 5 mg/m2 on D+1, +3, +6 and +11  3. rATG 1 mg/kg on D-2 and -1 included  ??  Heme:  Transfusion criteria: ABO identical donor [O-]   - Standard txf criteria: 1 unit of PRBCs for Hgb<7 and 1 unit of platelets for Plt <10K or bleeding.     ** No history of transfusion reactions.   - Granix for ANC of 1 or less [tried Udenyca but did not get a lasting result]    ** Given 8/27, 8/30, 9/3, 9/8, 9/10, 04/05/20, 9/17 (holding off as she has not had recent response)  - Has been requiring PRBCs and platelets weekly at least.  - . Stopped promacta 9/22/21per Dr. Oswald Hillock. BM bx next week.    **Plt and RBC today. Reds given due to DOE.     ID:????  Prophylaxis:  - Antiviral: Valtrex 500mg  daily.   - Antifungal: Posaconazole 300 mg daily  - Antibacterial:??Starting Cefdinir prophy [04/05/20]     - PJP: Montly pentamidine [7/31, 03/31/20].    CMV:   03/02/20: CMV 884. Valganciclovir started.    03/08/20: CMV decreased after 1 week of therapy to 140.  03/17/20: Last CMV <50. Decreased valcyte to 450  mg BID.   ** Letermovir denied. Appeal in progress.    03/22/20: CMV not detected. Maintenance dosing ends on 9/8  03/31/20: D/C Valcyte. CMV negative.   ??  HHV-6:   - Peaked on PCR at 41.2, now negative.   ??  EBV:   - Weekly, negative 03/31/20   ??  GI:??  -Decreased appetite.     ** Weight is stable.   ??  Loose stools:  -9/22: with cramping. Will send CDiff if able to produce a sample in clinic today.  -9/24: diarrhea better with no intervention    Nausea:  - Zofran 8mg  prn [taking bid]  ??  Renal:   04/14/20: SCr WNL  ??  FEN:   - Continue folic acid.    Hypomagnesemia:  - Mag Chelate 1 tablet daily.   ??  Endocrine:  Hypothyroid:??  - Continue Synthroid daily.   - TSH, T3, T4 - normal ??  Hepatic:??  - VOD prophylaxis not warranted as non-myeloablative regimen.   ??  Hepatocellular liver injury pattern:  - Mild increase in ALT, and AlkPhos, likely secondary to posaconazole.     ** Overall stable.   ??  Neuro/Pain:??  - No active issues.    Bone Health:   03/05/20: DEXA scan results:  - spine L1 - L4 T score -1.4  - L proximal femur T score -1.9  - L femoral neck T score -1.8  Based on DEXA scan, patient has osteopenia of spine, femur and femoral neck. Recommend administering zoledronic acid 4 mg IV every 3 months x 2 doses in addition to calcium and vitamin D supplementation 600 mg/ 800 units twice daily.   - Zometa given [8/18], final dose due in November.  ??  Psych:??Has a home psychiatrist. Alanson Aly [phone].   Depression/Anxiety:   - Per outpt note pt was taking Xanax 0.25 mg BID PRN for anxiety.  - Trintellix 10mg  nightly  - Lithium 300mg  nightly   04/02/20: Requested lithium level [last checked in June]     ** Dr. Jennelle Human was updated with results [04/05/20].    ??  Insomnia:  - Ambien 10mg  nightly PRN  ??  Caregiver/Lodging:  - Spouse [name] is her primary caregiver  - Staying at local rental.     Summary:  - Pancytopenia with neutropenia.   - Next week will see provider on Wednesday. She will have labs on Monday and Friday with no provider visit.    - Repeat INR 1.4. Reordered 3 days of vit k 5 mg.  Will repeat lab next week.   - 2nd transplant delayed till the [facility] results are back from the biopsy that will be done next week.  Dr Oswald Hillock came down to talk to husband as he has not heard the plan.  - Case discussed with Dr. Oswald Hillock to confirm the plan.     Dimple Nanas, ANP  Bone Marrow Transplant and Cellular Therapy Program    Future Appointments   Date Time Provider Department Center   04/16/2020 12:00 PM ONCINF CHAIR 04 [facility]   04/21/2020 10:45 AM ONCBMT LABS [facility]   04/21/2020 11:15 AM ONCBMT APP B [facility]   04/21/2020 12:30 PM ONCBMT PROCEDURES [facility]   04/27/2020 10:15 AM ONCBMT LABS [facility]   04/27/2020 10:45 AM Artelia Laroche, MD [facility]   04/28/2020 11:30 AM ONCINF CHAIR 06 [facility]   06/08/2020 11:00 AM ONCINF CHAIR  04 [facility]     I personally spent 50 minutes face-to-face and non-face-to-face in the care of this patient, which includes all pre, intra, and post visit time on the date of service.

## 2020-04-16 NOTE — Unmapped (Signed)
Hospital Outpatient Visit on 04/16/2020   Component Date Value Ref Range Status   ??? Platelet 04/16/2020 26* 150 - 440 10*9/L Final   ??? Unit Blood Type 04/16/2020 A Neg   Final   ??? ISBT Number 04/16/2020 0600   Final   ??? Unit # 04/16/2020 [id number]   Final   ??? Status 04/16/2020 Issued   Final   ??? Product ID 04/16/2020 Platelets   Final   ??? PRODUCT CODE 04/16/2020 [id number]   Final   ??? ABO Grouping 04/16/2020 O NEG   Final   ??? Antibody Screen 04/16/2020 NEG   Final   ??? Crossmatch 04/16/2020 Compatible   Final   ??? Unit Blood Type 04/16/2020 O Neg   Final   ??? ISBT Number 04/16/2020 9500   Final   ??? Unit # 04/16/2020 [id number]   Final   ??? Status 04/16/2020 Ready   Final   ??? Spec Expiration 04/16/2020 [id number]   Final   ??? Product ID 04/16/2020 Red Blood Cells   Final   ??? PRODUCT CODE 04/16/2020 [id number]   Final   Lab on 04/16/2020   Component Date Value Ref Range Status   ??? Sodium 04/16/2020 139  135 - 145 mmol/L Final   ??? Potassium 04/16/2020 3.5  3.4 - 4.5 mmol/L Final   ??? Chloride 04/16/2020 108* 98 - 107 mmol/L Final   ??? Anion Gap 04/16/2020 7  5 - 14 mmol/L Final   ??? CO2 04/16/2020 24.0  20.0 - 31.0 mmol/L Final   ??? BUN 04/16/2020 25* 9 - 23 mg/dL Final   ??? Creatinine 04/16/2020 1.17* 0.55 - 1.02 mg/dL Final   ??? BUN/Creatinine Ratio 04/16/2020 21   Final   ??? EGFR CKD-EPI Non-African American,* 04/16/2020 51* >=60 mL/min/1.45m2 Final   ??? EGFR CKD-EPI African American, Fem* 04/16/2020 59* >=60 mL/min/1.68m2 Final   ??? Glucose 04/16/2020 189* 70 - 179 mg/dL Final   ??? Calcium 66/44/0347 9.1  8.7 - 10.4 mg/dL Final   ??? Albumin 42/59/5638 3.8  3.4 - 5.0 g/dL Final   ??? Total Protein 04/16/2020 5.9  5.7 - 8.2 g/dL Final   ??? Total Bilirubin 04/16/2020 0.6  0.3 - 1.2 mg/dL Final   ??? AST 75/64/3329 30  <=34 U/L Final   ??? ALT 04/16/2020 57* 10 - 49 U/L Final   ??? Alkaline Phosphatase 04/16/2020 102  46 - 116 U/L Final   ??? Magnesium 04/16/2020 1.5* 1.6 - 2.6 mg/dL Final   ??? WBC 51/88/4166 0.4* 4.5 - 11.0 10*9/L Final ??? RBC 04/16/2020 2.56* 4.00 - 5.20 10*12/L Final   ??? HGB 04/16/2020 7.3* 12.0 - 16.0 g/dL Final   ??? HCT 01/21/1600 21.0* 36.0 - 46.0 % Final   ??? MCV 04/16/2020 81.9  80.0 - 100.0 fL Final   ??? MCH 04/16/2020 28.7  26.0 - 34.0 pg Final   ??? MCHC 04/16/2020 35.0  31.0 - 37.0 g/dL Final   ??? RDW 09/32/3557 15.2* 12.0 - 15.0 % Final   ??? MPV 04/16/2020 8.1  7.0 - 10.0 fL Final   ??? Platelet 04/16/2020 11* 150 - 440 10*9/L Final   ??? Variable HGB Concentration 04/16/2020 Slight* Not Present Final   ??? Neutrophils % 04/16/2020 43.6  % Final   ??? Lymphocytes % 04/16/2020 19.3  % Final   ??? Monocytes % 04/16/2020 21.1  % Final   ??? Eosinophils % 04/16/2020 0.4  % Final   ??? Basophils % 04/16/2020 0.0  % Final   ???  Absolute Neutrophils 04/16/2020 0.2* 2.0 - 7.5 10*9/L Final   ??? Absolute Lymphocytes 04/16/2020 0.1* 1.5 - 5.0 10*9/L Final   ??? Absolute Monocytes 04/16/2020 0.1* 0.2 - 0.8 10*9/L Final   ??? Absolute Eosinophils 04/16/2020 0.0  0.0 - 0.4 10*9/L Final   ??? Absolute Basophils 04/16/2020 0.0  0.0 - 0.1 10*9/L Final   ??? Large Unstained Cells 04/16/2020 16* 0 - 4 % Final   ??? Microcytosis 04/16/2020 Slight* Not Present Final   ??? Hyperchromasia 04/16/2020 Slight* Not Present Final   ??? Smear Review Comments 04/16/2020 See Comment* Undefined Final    Slide Reviewed.     ??? Toxic Granulation 04/16/2020 Present* Not Present Final

## 2020-04-17 NOTE — Unmapped (Unsigned)
Bone Marrow Transplant and Cellular Therapy Program  Immunosuppressive Therapy Note    Susan Kane is a 58 y.o. 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 Susan Kane is currently day +93.    Current dose: tacrolimus 1 mg in AM and 1.5 mg in PM    Goal tacrolimus Level: 5-10 ng/mL    Resulted level: 4.4 ng/mL @ 1103 (likely > 5 given later AM draw)    Lab Results   Component Value Date/Time    TACROLIMUS 4.4 (L) 04/16/2020 11:03 AM    TACROLIMUS 4.8 (L) 04/14/2020 11:11 AM    TACROLIMUS 4.7 (L) 04/12/2020 09:58 AM    TACROLIMUS 14.1 04/09/2020 02:27 PM     Lab Results   Component Value Date/Time    CREATININE 1.17 (H) 04/16/2020 11:03 AM    CREATININE 1.10 (H) 04/14/2020 11:11 AM    CREATININE 1.18 (H) 04/12/2020 09:58 AM    CREATININE 1.05 (H) 04/09/2020 09:46 AM      Assessment/Recommendation: Tacrolimus level is likely therapeutic. Scr is slightly elevated and LFTs/Tbili remain WNL. Given the above, will continue tacrolimus 1 mg PO qAM and 1.5 mg qPM and will recheck at next clinic visit to further assess trend and need for adjustment.      We will continue to monitor levels.  Patient will be followed for changes in renal and hepatic function, toxicity, and efficacy.     Rulon Abide, PharmD, BCOP  Clinical Pharmacist Practitioner, BMT

## 2020-04-17 NOTE — Unmapped (Signed)
Patient arrived to chair 8.  No complaints noted.  Access of CVC intact positive blood return.     Patient completed and tolerated treatment.  AVS declined and patient discharged to home.

## 2020-04-19 ENCOUNTER — Encounter: Admit: 2020-04-19 | Discharge: 2020-04-20 | Payer: PRIVATE HEALTH INSURANCE

## 2020-04-19 DIAGNOSIS — D849 Immunodeficiency, unspecified: Secondary | ICD-10-CM | POA: Diagnosis not present

## 2020-04-19 DIAGNOSIS — C9111 Chronic lymphocytic leukemia of B-cell type in remission: Secondary | ICD-10-CM | POA: Diagnosis not present

## 2020-04-19 DIAGNOSIS — Z9484 Stem cells transplant status: Secondary | ICD-10-CM | POA: Diagnosis not present

## 2020-04-19 DIAGNOSIS — D619 Aplastic anemia, unspecified: Secondary | ICD-10-CM | POA: Diagnosis not present

## 2020-04-19 DIAGNOSIS — C9110 Chronic lymphocytic leukemia of B-cell type not having achieved remission: Principal | ICD-10-CM

## 2020-04-19 DIAGNOSIS — R791 Abnormal coagulation profile: Principal | ICD-10-CM

## 2020-04-19 LAB — TACROLIMUS, TROUGH: Lab: 5.5

## 2020-04-19 LAB — CBC W/ AUTO DIFF
BASOPHILS ABSOLUTE COUNT: 0 10*9/L (ref 0.0–0.1)
BASOPHILS RELATIVE PERCENT: 0 %
EOSINOPHILS ABSOLUTE COUNT: 0 10*9/L (ref 0.0–0.4)
EOSINOPHILS RELATIVE PERCENT: 0.3 %
HEMATOCRIT: 22.1 % — ABNORMAL LOW (ref 36.0–46.0)
HEMOGLOBIN: 8.1 g/dL — ABNORMAL LOW (ref 12.0–16.0)
LARGE UNSTAINED CELLS: 8 % — ABNORMAL HIGH (ref 0–4)
LYMPHOCYTES RELATIVE PERCENT: 30.3 %
MEAN CORPUSCULAR HEMOGLOBIN CONC: 36.7 g/dL (ref 31.0–37.0)
MEAN CORPUSCULAR HEMOGLOBIN: 29.7 pg (ref 26.0–34.0)
MEAN CORPUSCULAR VOLUME: 81 fL (ref 80.0–100.0)
MEAN PLATELET VOLUME: 9.5 fL (ref 7.0–10.0)
MONOCYTES ABSOLUTE COUNT: 0.1 10*9/L — ABNORMAL LOW (ref 0.2–0.8)
NEUTROPHILS ABSOLUTE COUNT: 0.2 10*9/L — CL (ref 2.0–7.5)
NEUTROPHILS RELATIVE PERCENT: 41.3 %
PLATELET COUNT: 16 10*9/L — ABNORMAL LOW (ref 150–440)
RED BLOOD CELL COUNT: 2.73 10*12/L — ABNORMAL LOW (ref 4.00–5.20)
RED CELL DISTRIBUTION WIDTH: 14.3 % (ref 12.0–15.0)
WBC ADJUSTED: 0.4 10*9/L — CL (ref 4.5–11.0)

## 2020-04-19 LAB — GLUCOSE RANDOM: Glucose:MCnc:Pt:Ser/Plas:Qn:: 200 — ABNORMAL HIGH

## 2020-04-19 LAB — SMEAR REVIEW

## 2020-04-19 LAB — COMPREHENSIVE METABOLIC PANEL
ALKALINE PHOSPHATASE: 112 U/L (ref 46–116)
ALT (SGPT): 59 U/L — ABNORMAL HIGH (ref 10–49)
ANION GAP: 9 mmol/L (ref 5–14)
AST (SGOT): 32 U/L (ref <=34)
BILIRUBIN TOTAL: 0.5 mg/dL (ref 0.3–1.2)
BLOOD UREA NITROGEN: 21 mg/dL (ref 9–23)
BUN / CREAT RATIO: 18
CALCIUM: 9.3 mg/dL (ref 8.7–10.4)
CHLORIDE: 105 mmol/L (ref 98–107)
CO2: 24 mmol/L (ref 20.0–31.0)
CREATININE: 1.17 mg/dL — ABNORMAL HIGH
EGFR CKD-EPI AA FEMALE: 59 mL/min/{1.73_m2} — ABNORMAL LOW (ref >=60)
EGFR CKD-EPI NON-AA FEMALE: 51 mL/min/{1.73_m2} — ABNORMAL LOW (ref >=60)
GLUCOSE RANDOM: 200 mg/dL — ABNORMAL HIGH (ref 70–179)
PROTEIN TOTAL: 6.1 g/dL (ref 5.7–8.2)
SODIUM: 138 mmol/L (ref 135–145)

## 2020-04-19 LAB — MAGNESIUM: Magnesium:MCnc:Pt:Ser/Plas:Qn:: 1.5 — ABNORMAL LOW

## 2020-04-19 LAB — EOSINOPHILS ABSOLUTE COUNT: Eosinophils:NCnc:Pt:Bld:Qn:Automated count: 0

## 2020-04-19 LAB — PROTIME-INR
INR: 1.44
PROTIME: 16.8 s — ABNORMAL HIGH (ref 10.3–13.4)

## 2020-04-19 LAB — PROTIME: Coagulation tissue factor induced:Time:Pt:PPP:Qn:Coag: 16.8 — ABNORMAL HIGH

## 2020-04-19 MED ADMIN — magnesium sulfate 2gm/50mL IVPB: 2 g | INTRAVENOUS | @ 16:00:00 | Stop: 2020-04-19

## 2020-04-19 MED ADMIN — heparin, porcine (PF) 100 unit/mL injection 200 Units: 200 [IU] | INTRAVENOUS | @ 14:00:00 | Stop: 2020-04-19

## 2020-04-19 MED ADMIN — heparin, porcine (PF) 100 unit/mL injection 200 Units: 200 [IU] | INTRAVENOUS | @ 19:00:00 | Stop: 2020-04-20

## 2020-04-19 NOTE — Unmapped (Signed)
Results for orders placed or performed during the hospital encounter of 04/19/20   Prepare RBC   Result Value Ref Range    Crossmatch Compatible     Unit Blood Type O Neg     ISBT Number 9500     Unit # [id number]     Status Issued     Spec Expiration [id number]     Product ID Red Blood Cells     PRODUCT CODE [id number]    Results for orders placed or performed in visit on 04/19/20   Magnesium Level   Result Value Ref Range    Magnesium 1.5 (L) 1.6 - 2.6 mg/dL   Tacrolimus Level, Trough   Result Value Ref Range    Tacrolimus, Trough 5.5 5.0 - 15.0 ng/mL   Comprehensive Metabolic Panel   Result Value Ref Range    Sodium 138 135 - 145 mmol/L    Potassium 3.7 3.4 - 4.5 mmol/L    Chloride 105 98 - 107 mmol/L    Anion Gap 9 5 - 14 mmol/L    CO2 24.0 20.0 - 31.0 mmol/L    BUN 21 9 - 23 mg/dL    Creatinine 4.33 (H) 0.55 - 1.02 mg/dL    BUN/Creatinine Ratio 18     EGFR CKD-EPI Non-African American, Female 51 (L) >=60 mL/min/1.78m2    EGFR CKD-EPI African American, Female 59 (L) >=60 mL/min/1.53m2    Glucose 200 (H) 70 - 179 mg/dL    Calcium 9.3 8.7 - 29.5 mg/dL    Albumin 3.9 3.4 - 5.0 g/dL    Total Protein 6.1 5.7 - 8.2 g/dL    Total Bilirubin 0.5 0.3 - 1.2 mg/dL    AST 32 <=18 U/L    ALT 59 (H) 10 - 49 U/L    Alkaline Phosphatase 112 46 - 116 U/L   PT-INR   Result Value Ref Range    PT 16.8 (H) 10.3 - 13.4 sec    INR 1.44    Type and Screen   Result Value Ref Range    ABO Grouping O NEG     Antibody Screen NEG    CBC w/ Differential   Result Value Ref Range    WBC 0.4 (LL) 4.5 - 11.0 10*9/L    RBC 2.73 (L) 4.00 - 5.20 10*12/L    HGB 8.1 (L) 12.0 - 16.0 g/dL    HCT 84.1 (L) 66.0 - 46.0 %    MCV 81.0 80.0 - 100.0 fL    MCH 29.7 26.0 - 34.0 pg    MCHC 36.7 31.0 - 37.0 g/dL    RDW 63.0 16.0 - 10.9 %    MPV 9.5 7.0 - 10.0 fL    Platelet 16 (L) 150 - 440 10*9/L    Variable HGB Concentration Slight (A) Not Present    Neutrophils % 41.3 %    Lymphocytes % 30.3 %    Monocytes % 19.9 %    Eosinophils % 0.3 %    Basophils % 0.0 %    Neutrophil Left Shift 1+ (A) Not Present    Absolute Neutrophils 0.2 (LL) 2.0 - 7.5 10*9/L    Absolute Lymphocytes 0.1 (L) 1.5 - 5.0 10*9/L    Absolute Monocytes 0.1 (L) 0.2 - 0.8 10*9/L    Absolute Eosinophils 0.0 0.0 - 0.4 10*9/L    Absolute Basophils 0.0 0.0 - 0.1 10*9/L    Large Unstained Cells 8 (H) 0 - 4 %    Microcytosis Slight (A) Not Present  Hyperchromasia Slight (A) Not Present   Morphology Review   Result Value Ref Range    Smear Review Comments See Comment (A) Undefined

## 2020-04-19 NOTE — Unmapped (Signed)
Patient arrived to chair 6. Complaints of SOB and some fatigue noted, plans to transfuse PRB and give Mag.  Access of hickman intact w/ brisk blood return.   Patient completed and tolerated treatment.  AVS printed and patient discharged to home.

## 2020-04-21 ENCOUNTER — Encounter: Admit: 2020-04-21 | Discharge: 2020-04-22 | Payer: PRIVATE HEALTH INSURANCE

## 2020-04-21 ENCOUNTER — Ambulatory Visit: Admit: 2020-04-21 | Discharge: 2020-04-22 | Payer: PRIVATE HEALTH INSURANCE

## 2020-04-21 ENCOUNTER — Encounter
Admit: 2020-04-21 | Discharge: 2020-04-22 | Payer: PRIVATE HEALTH INSURANCE | Attending: Primary Care | Primary: Primary Care

## 2020-04-21 DIAGNOSIS — E039 Hypothyroidism, unspecified: Secondary | ICD-10-CM | POA: Diagnosis not present

## 2020-04-21 DIAGNOSIS — Z79899 Other long term (current) drug therapy: Secondary | ICD-10-CM | POA: Diagnosis not present

## 2020-04-21 DIAGNOSIS — C9110 Chronic lymphocytic leukemia of B-cell type not having achieved remission: Secondary | ICD-10-CM | POA: Diagnosis not present

## 2020-04-21 DIAGNOSIS — D619 Aplastic anemia, unspecified: Secondary | ICD-10-CM | POA: Diagnosis not present

## 2020-04-21 DIAGNOSIS — F329 Major depressive disorder, single episode, unspecified: Secondary | ICD-10-CM | POA: Diagnosis not present

## 2020-04-21 DIAGNOSIS — Z9484 Stem cells transplant status: Secondary | ICD-10-CM | POA: Diagnosis not present

## 2020-04-21 DIAGNOSIS — D801 Nonfamilial hypogammaglobulinemia: Secondary | ICD-10-CM | POA: Diagnosis not present

## 2020-04-21 DIAGNOSIS — R112 Nausea with vomiting, unspecified: Secondary | ICD-10-CM | POA: Diagnosis not present

## 2020-04-21 DIAGNOSIS — G4700 Insomnia, unspecified: Secondary | ICD-10-CM | POA: Diagnosis not present

## 2020-04-21 DIAGNOSIS — B1910 Unspecified viral hepatitis B without hepatic coma: Secondary | ICD-10-CM | POA: Diagnosis not present

## 2020-04-21 DIAGNOSIS — Z856 Personal history of leukemia: Secondary | ICD-10-CM | POA: Diagnosis not present

## 2020-04-21 DIAGNOSIS — Z9221 Personal history of antineoplastic chemotherapy: Secondary | ICD-10-CM | POA: Diagnosis not present

## 2020-04-21 DIAGNOSIS — T865 Complications of stem cell transplant: Secondary | ICD-10-CM | POA: Diagnosis not present

## 2020-04-21 DIAGNOSIS — F419 Anxiety disorder, unspecified: Secondary | ICD-10-CM | POA: Diagnosis not present

## 2020-04-21 DIAGNOSIS — Z792 Long term (current) use of antibiotics: Secondary | ICD-10-CM | POA: Diagnosis not present

## 2020-04-21 DIAGNOSIS — R197 Diarrhea, unspecified: Secondary | ICD-10-CM | POA: Diagnosis not present

## 2020-04-21 DIAGNOSIS — Z7682 Awaiting organ transplant status: Secondary | ICD-10-CM | POA: Diagnosis not present

## 2020-04-21 DIAGNOSIS — D759 Disease of blood and blood-forming organs, unspecified: Secondary | ICD-10-CM | POA: Diagnosis not present

## 2020-04-21 LAB — COMPREHENSIVE METABOLIC PANEL
ALBUMIN: 3.8 g/dL (ref 3.4–5.0)
ALKALINE PHOSPHATASE: 114 U/L (ref 46–116)
ALT (SGPT): 104 U/L — ABNORMAL HIGH (ref 10–49)
ANION GAP: 6 mmol/L (ref 5–14)
AST (SGOT): 68 U/L — ABNORMAL HIGH (ref <=34)
BILIRUBIN TOTAL: 0.7 mg/dL (ref 0.3–1.2)
BLOOD UREA NITROGEN: 24 mg/dL — ABNORMAL HIGH (ref 9–23)
BUN / CREAT RATIO: 19
CALCIUM: 9.1 mg/dL (ref 8.7–10.4)
CHLORIDE: 105 mmol/L (ref 98–107)
CO2: 26 mmol/L (ref 20.0–31.0)
CREATININE: 1.27 mg/dL — ABNORMAL HIGH
EGFR CKD-EPI AA FEMALE: 53 mL/min/{1.73_m2} — ABNORMAL LOW (ref >=60)
EGFR CKD-EPI NON-AA FEMALE: 46 mL/min/{1.73_m2} — ABNORMAL LOW (ref >=60)
GLUCOSE RANDOM: 211 mg/dL — ABNORMAL HIGH (ref 70–179)
POTASSIUM: 3.4 mmol/L (ref 3.4–4.5)
PROTEIN TOTAL: 6 g/dL (ref 5.7–8.2)
SODIUM: 137 mmol/L (ref 135–145)

## 2020-04-21 LAB — CBC W/ AUTO DIFF
BASOPHILS ABSOLUTE COUNT: 0 10*9/L (ref 0.0–0.1)
BASOPHILS RELATIVE PERCENT: 0.6 %
EOSINOPHILS ABSOLUTE COUNT: 0 10*9/L (ref 0.0–0.4)
EOSINOPHILS RELATIVE PERCENT: 0.3 %
HEMATOCRIT: 25.7 % — ABNORMAL LOW (ref 36.0–46.0)
HEMOGLOBIN: 9.2 g/dL — ABNORMAL LOW (ref 12.0–16.0)
LARGE UNSTAINED CELLS: 13 % — ABNORMAL HIGH (ref 0–4)
LYMPHOCYTES ABSOLUTE COUNT: 0.1 10*9/L — ABNORMAL LOW (ref 1.5–5.0)
LYMPHOCYTES RELATIVE PERCENT: 29.4 %
MEAN CORPUSCULAR HEMOGLOBIN CONC: 35.6 g/dL (ref 31.0–37.0)
MEAN CORPUSCULAR HEMOGLOBIN: 29 pg (ref 26.0–34.0)
MEAN CORPUSCULAR VOLUME: 81.4 fL (ref 80.0–100.0)
MEAN PLATELET VOLUME: 7.9 fL (ref 7.0–10.0)
MONOCYTES ABSOLUTE COUNT: 0.1 10*9/L — ABNORMAL LOW (ref 0.2–0.8)
MONOCYTES RELATIVE PERCENT: 20.1 %
NEUTROPHILS ABSOLUTE COUNT: 0.2 10*9/L — CL (ref 2.0–7.5)
NEUTROPHILS RELATIVE PERCENT: 36.4 %
PLATELET COUNT: 8 10*9/L — CL (ref 150–440)
RED BLOOD CELL COUNT: 3.16 10*12/L — ABNORMAL LOW (ref 4.00–5.20)
RED CELL DISTRIBUTION WIDTH: 14.4 % (ref 12.0–15.0)
WBC ADJUSTED: 0.4 10*9/L — CL (ref 4.5–11.0)

## 2020-04-21 LAB — SLIDE REVIEW

## 2020-04-21 LAB — MAGNESIUM: MAGNESIUM: 1.8 mg/dL (ref 1.6–2.6)

## 2020-04-21 LAB — CMV DNA, QUANTITATIVE, PCR: CMV VIRAL LD: NOT DETECTED

## 2020-04-21 LAB — PLATELET COUNT: PLATELET COUNT: 16 10*9/L — ABNORMAL LOW (ref 150–440)

## 2020-04-21 LAB — TACROLIMUS LEVEL, TROUGH: TACROLIMUS, TROUGH: 5.3 ng/mL (ref 5.0–15.0)

## 2020-04-21 MED ORDER — TRAMADOL 50 MG TABLET
ORAL_TABLET | Freq: Four times a day (QID) | ORAL | 0 refills | 3.00000 days | Status: CP
Start: 2020-04-21 — End: 2020-04-24
  Filled 2020-04-21: qty 10, 2d supply, fill #0

## 2020-04-21 MED ADMIN — heparin, porcine (PF) 100 unit/mL injection 200 Units: 200 [IU] | INTRAVENOUS | @ 15:00:00 | Stop: 2020-04-21

## 2020-04-21 MED ADMIN — lidocaine (XYLOCAINE) 20 mg/mL (2 %) injection 15 mL: 15 mL | INTRADERMAL | @ 18:00:00 | Stop: 2020-04-21

## 2020-04-21 MED ADMIN — sodium chloride (NS) 0.9 % infusion: 50 mL/h | INTRAVENOUS | @ 17:00:00

## 2020-04-21 MED ADMIN — heparin, porcine (PF) 100 unit/mL injection 200 Units: 200 [IU] | INTRAVENOUS | @ 21:00:00 | Stop: 2020-04-22

## 2020-04-21 MED ADMIN — LORazepam (ATIVAN) injection 1.5 mg: 1.5 mg | INTRAVENOUS | @ 18:00:00

## 2020-04-21 MED FILL — TRAMADOL 50 MG TABLET: 2 days supply | Qty: 10 | Fill #0 | Status: AC

## 2020-04-21 NOTE — Unmapped (Signed)
Bone Marrow Transplant and Cellular Therapy Program  Immunosuppressive Therapy Note    Susan Kane is a 59 y.o. 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 Susan Kane is currently day +98.    Current dose: tacrolimus 1 mg in AM and 1.5 mg in PM    Goal tacrolimus Level: 5-10 ng/mL    Resulted level: 5.3 ng/mL     Lab Results   Component Value Date/Time    TACROLIMUS 5.3 04/21/2020 10:58 AM    TACROLIMUS 5.5 04/19/2020 10:11 AM    TACROLIMUS 4.4 (L) 04/16/2020 11:03 AM    TACROLIMUS 4.8 (L) 04/14/2020 11:11 AM     Lab Results   Component Value Date/Time    CREATININE 1.27 (H) 04/21/2020 11:08 AM    CREATININE 1.17 (H) 04/19/2020 10:11 AM    CREATININE 1.17 (H) 04/16/2020 11:03 AM    CREATININE 1.10 (H) 04/14/2020 11:11 AM      Assessment/Recommendation: Tacrolimus level is therapeutic and true trough likely higher than 5.3 as level was drawn late morning. Scr is slightly elevated and LFTs/Tbili remain WNL. Given the above, will continue tacrolimus 1 mg PO qAM and 1.5 mg qPM and will recheck at next clinic visit to further assess trend and need for adjustment.      We will continue to monitor levels.  Patient will be followed for changes in renal and hepatic function, toxicity, and efficacy.     Rulon Abide, PharmD, BCOP  Clinical Pharmacist Practitioner, BMT

## 2020-04-21 NOTE — Unmapped (Signed)
All lab results last 24 hours:    Recent Results (from the past 24 hour(s))   Prepare RBC    Collection Time: 04/20/20  7:00 PM   Result Value Ref Range    Crossmatch Compatible     Unit Blood Type O Neg     ISBT Number 9500     Unit # [id number]     Status Transfused     Product ID Red Blood Cells     PRODUCT CODE [id number]    CBC w/ Differential    Collection Time: 04/21/20 11:04 AM   Result Value Ref Range    WBC 0.4 (LL) 4.5 - 11.0 10*9/L    RBC 3.16 (L) 4.00 - 5.20 10*12/L    HGB 9.2 (L) 12.0 - 16.0 g/dL    HCT 95.6 (L) 21.3 - 46.0 %    MCV 81.4 80.0 - 100.0 fL    MCH 29.0 26.0 - 34.0 pg    MCHC 35.6 31.0 - 37.0 g/dL    RDW 08.6 57.8 - 46.9 %    MPV 7.9 7.0 - 10.0 fL    Platelet 8 (LL) 150 - 440 10*9/L    Variable HGB Concentration Slight (A) Not Present    Neutrophils % 36.4 %    Lymphocytes % 29.4 %    Monocytes % 20.1 %    Eosinophils % 0.3 %    Basophils % 0.6 %    Absolute Neutrophils 0.2 (LL) 2.0 - 7.5 10*9/L    Absolute Lymphocytes 0.1 (L) 1.5 - 5.0 10*9/L    Absolute Monocytes 0.1 (L) 0.2 - 0.8 10*9/L    Absolute Eosinophils 0.0 0.0 - 0.4 10*9/L    Absolute Basophils 0.0 0.0 - 0.1 10*9/L    Large Unstained Cells 13 (H) 0 - 4 %    Microcytosis Slight (A) Not Present    Hyperchromasia Slight (A) Not Present   Comprehensive Metabolic Panel    Collection Time: 04/21/20 11:08 AM   Result Value Ref Range    Sodium 137 135 - 145 mmol/L    Potassium 3.4 3.4 - 4.5 mmol/L    Chloride 105 98 - 107 mmol/L    Anion Gap 6 5 - 14 mmol/L    CO2 26.0 20.0 - 31.0 mmol/L    BUN 24 (H) 9 - 23 mg/dL    Creatinine 6.29 (H) 0.55 - 1.02 mg/dL    BUN/Creatinine Ratio 19     EGFR CKD-EPI Non-African American, Female 46 (L) >=60 mL/min/1.62m2    EGFR CKD-EPI African American, Female 53 (L) >=60 mL/min/1.20m2    Glucose 211 (H) 70 - 179 mg/dL    Calcium 9.1 8.7 - 52.8 mg/dL    Albumin 3.8 3.4 - 5.0 g/dL    Total Protein 6.0 5.7 - 8.2 g/dL    Total Bilirubin 0.7 0.3 - 1.2 mg/dL    AST 68 (H) <=41 U/L    ALT 104 (H) 10 - 49 U/L Alkaline Phosphatase 114 46 - 116 U/L   Magnesium Level    Collection Time: 04/21/20 11:08 AM   Result Value Ref Range    Magnesium 1.8 1.6 - 2.6 mg/dL

## 2020-04-21 NOTE — Unmapped (Signed)
Patient arrived for platelet transfusion and BMBX. Patient tolerated and completed transfusion. Patient transferred to chair 5 and report given to [facility] for transfer of care.

## 2020-04-21 NOTE — Unmapped (Signed)
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   Taisia [facility]ini [facility] was made aware that she is not to drive for the next 24 hours if receiving any IV medications associated with this procedure. She verbalized understanding and signed informed consent. After a time-out in which his patient identifiers were checked by 2 providers, the patient was laid in the prone position on the table. The right posterior superior iliac spine and iliac crest were cleaned, prepped and draped in the usual sterile fashion.     Site: right posterior superior iliac spine.     Aseptic preparation, draping, and technique was used.     Anesthesia: 2% Lidocaine.    Periprocedural medications: Ativan 1.5 mg iv once for situational anxiety    Core Biopsy was obtained.     Aspirate was obtained.     Pressure applied and hemostasis achieved. Sterile bandage applied.    Specimen was sent for routine histopathologic stains and sectioning, flow cytometry, cytogenetics and molecular analysis.       Complications   None.     Specimen(s)   Bone marrow aspirate and core biopsy.     Additional post procedure instructions:  The patient was given verbal instructions for wound care, such as to keep the biopsy site dry, covered for 24 hours, and to call the provider with including but not limited to: bleeding from the site, redness and/or puss drainage from the site and fever.      The results will be reviewed with the patient when complete.

## 2020-04-21 NOTE — Unmapped (Signed)
BMT-CT Clinic Follow-up    Referring Physician: Jillyn Hidden Nida Boatman) [name], MD   Primary Care Provider: Thora Lance, MD  BMT Attending MD: Lanae Boast, MD    Disease: CLL  Current disease status: SD (stable disease)  Type of Transplant: RIC MUD Allo  Graft Source: Cryopreserved PBSCs  Transplant Day: +98    Study Participant: BMT CTN 1704: Composite Health Assessment Risk Model for Older Adults: Applying Pre-transplant Comorbidity, Geriatric Assessment and Biomarkers to Predict Non-Relapse Mortality After Allogeneic Transplant (CHARM).  ??  HPI:  Susan Kane??is a 59 y.o.??woman??with a diagnosis of CLL??and aplastic anemia who is now s/p RIC MUD AlloSCT. Her transplant course was complicated by delayed engraftment. ??She was initially discharged from the hospital on 02/23/2020. However was re-admitted on 02/27/20 with a fever and need for workup and IV cefepime. Infectious workup was unrevealing, IV cefepime was stopped on 03/02/20, and she was once again discharged on 03/03/20.  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  Currently planning for second transplant.      Interval History: Susan Kane presents with her husband for routine clinic follow up today.  She has been here on Monday for labs and got a unit of PRBCs.  She has otherwise been stable. At times she has vomited.  She has vomiting yesterday x 2 and the night before x 1.  She is eating but eating small amounts. She has not had diarrhea. She has no skin rash. No pain. She is fatigued.   She is due for bone marrow biopsy today.  She will need to cancel Thursday appt and she is coming Friday and Sunday.  She has no c/o today.       Oncology History Overview Note   CLL:    Summary as per Dr. Kalman Drape most recent note with minor edits/additions from my review of records    1.??CLL?????diagnosed in August 2010, flow cytometry consistent with CLL  ?? Enlarged left??inguinal lymph node January 2019,??small??neck/axillary nodes and palpable splenomegaly 09/11/2017  ?? CTs??on 09/17/2017-3 cm necrotic appearing lymph node in the left inguinal region, borderline enlarged pelvic/retroperitoneal, chest, and axillary nodes. ??Mild splenomegaly.  ?? Ultrasound-guided biopsy of the left inguinal lymph node 09/18/2017, slightly purulent fluid aspirated, core biopsy is consistent with an atypical lymphoid proliferation???extensive necrosis with surrounding epithelioid histiocytes, limited intact lymphoid tissue involved with CLL  ?? Incisional biopsy of a necrotic/purulent left inguinal lymph node on 10/01/2017???extensive necrosis with granulomatous inflammation, small amount of viable lymphoid tissue involved with CLL, AFB and fungal stains negative  ?? Peripheral blood FISH analysis 02/05/2018??? +deletion 13q14, no evidence of p53 (17p13) deletion, no evidence of 11q22??deletion  - normal karyotype (46,XX)  ?? Bone marrow biopsy 02/26/2018???hypercellular marrow with extensive involvement by CLL, lymphocytes represent??85% of all cells  ?? Ibrutinib initiated 04/03/2018  ?? Ibrutinib placed on hold 04/11/2018 due to onset of arthralgias  ?? Ibrutinib resumed 04/16/2018, discontinued 04/25/2018 secondary to severe arthralgias/arthritis  ?? Ibrutinib resumed at a dose of 140 mg daily 05/03/2018  ?? Ibrutinib dose adjusted to 140 mg alternating with 280 mg 06/25/2018  ?? 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 ??Augmentin prescribed.  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 DAT negative, bilirubin and LDH normal June 2019, progressive symptomatic anemia 04/01/2018, red cell transfusions 04/01/2018,??04/30/2018,??05/28/2018,  06/17/2018, and 07/05/2018  7.????Hypogammaglobulinemia    Baseline BM [facility]x reviewed at [facility]  Final Diagnosis   (Outside Case #:  [id number], dated 02/26/2018)  Bone marrow, aspiration and biopsy  -  Hypercellular bone marrow (80%) with extensive involvement by chronic lymphocytic leukemia (87% lymphocytes by manual aspirate differential)  (See Comment)  -  By outside report, cytogenetic results are normal     Karyotype: 54, XX and FISH with 13q del but no 11q or 17p del    Repeat BM bx: (done after being off ibrutinib x1 month)  80% involvement by CLL    08/13/18: Presents to [facility] to discuss plan of care     Chronic lymphocytic leukemia (CLL), B-cell (CMS-HCC)   07/12/2018 Initial Diagnosis    Chronic lymphocytic leukemia (CLL), B-cell (CMS-HCC)     08/26/2018 -  Chemotherapy    Acalabrutinib started (approximate date)     11/14/2018 -  Chemotherapy    Acalabrutinib discontinued due to progressive bone marrow failure     12/06/2018 -  Chemotherapy    Ritux x1 given due to CLL still making up majority of BM cellularity (though in the setting of near aplasia of rest of TLH)     03/19/2019 -  Chemotherapy    Equine ATG / CsA for aplastic anemia (suspect 2/t acalabrutinib)     Chronic lymphocytic leukemia of B-cell type not having achieved remission (CMS-HCC)   01/21/2019 Initial Diagnosis    Chronic lymphocytic leukemia of B-cell type not having achieved remission (CMS-HCC)     01/01/2020 -  Chemotherapy    BMT IP BENDAMUSTINE / FLUDARABINE / RITUXIMAB (OP RITUXIMAB Days -13 & -6) (MUD)  bendamustine 130 mg/m2 IV on days -5, -4, -3   fludarabine 30 mg/m2 IV on days -5, -4, -3   riTUXimab 375 mg/m2 IV on day -13 as a OP treatment day, then 1,000 mg/m2 on days -6 as a OP treatment day, +1, +8   tacrolimus, 0.045 mg/kg PO BID on days -3 and -2 then  0.03 mg/kg PO BID starting on day -1   methotrexate 5 mg/m2 IV on days +1, +3, +6, +11   tbo-filgrastim 300 mcg (<= 75 kg) / 480 mcg (> 75 kg) q24h starting on day +7        Susan Kane Active Problem List   Diagnosis   ??? Chronic lymphocytic leukemia (CLL), B-cell (CMS-HCC)   ??? Hypogammaglobulinemia (CMS-HCC) ??? Chronic lymphocytic leukemia of B-cell type not having achieved remission (CMS-HCC)   ??? Aplastic anemia (CMS-HCC)   ??? 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 Drug-induced nausea and vomiting   ??? Stem cell transplant candidate   ??? Chemotherapy induced neutropenia (CMS-HCC)   ??? History of allogeneic stem cell transplant (CMS-HCC)   ??? Osteopenia   ??? Neutropenia, drug-induced (CMS-HCC)   ??? Immunosuppressed status (CMS-HCC)   ??? Bone marrow hypocellularity   ??? Neutropenia (CMS-HCC)     Review of Systems:  A full system review was performed and was negative except as noted in the above interval history.    Reviewed and updated past medical, surgical, social, and family history as appropriate.      No Known Allergies  Current Outpatient Medications   Medication Sig Dispense Refill   ??? calcium carbonate-vitamin D3 600 mg(1,500mg ) -800 unit Tab Take 1 tablet by mouth Two (2) times a day. (Susan Kane not taking: Reported on 03/05/2020) 60 tablet 5   ??? cefdinir (OMNICEF) 300 MG capsule Take 1 capsule (300  mg total) by mouth Two (2) times a day. 60 capsule 1   ??? famotidine (PEPCID) 20 MG tablet Take 1 tablet (20 mg total) by mouth two (2) times a day as needed for heartburn. 60 tablet 0   ??? folic acid (FOLVITE) 1 MG tablet Take 1 tablet (1 mg total) by mouth at bedtime. 30 tablet 5   ??? levothyroxine (SYNTHROID) 100 MCG tablet Take 1 tablet (100 mcg total) by mouth daily. 30 tablet 5   ??? lithium (LITHOBID) 300 MG ER tablet Take 1 tablet (300 mg total) by mouth at bedtime. 30 tablet 5   ??? magnesium oxide-Mg AA chelate (MAGNESIUM, AMINO ACID CHELATE,) 133 mg Tab Take 1 tablet by mouth daily. 100 tablet 2   ??? ondansetron (ZOFRAN) 8 MG tablet Take 1 tablet (8 mg total) by mouth every twelve (12) hours as needed for nausea. 30 tablet 2   ??? polyethylene glycol (MIRALAX) 17 gram packet Take 17 g by mouth daily as needed (constipation).     ??? posaconazole (NOXAFIL) 100 mg TbEC delayed released tablet Take 3 tablets (300 mg total) by mouth daily. 90 tablet 1   ??? tacrolimus (PROGRAF) 0.5 MG capsule Take 2 capsules (1 mg total) by mouth daily AND 3 capsules (1.5 mg total) nightly. 150 capsule 5   ??? TRINTELLIX 10 mg tablet Take 1 tablet (10 mg total) by mouth at bedtime. 30 tablet 5   ??? valACYclovir (VALTREX) 500 MG tablet Take 1 tablet (500 mg total) by mouth daily. 30 tablet 11   ??? zolpidem (AMBIEN) 5 MG tablet Take 1 to 2 tablets (5 mg-10 mg) as needed for insomnia. 60 tablet 0     No current facility-administered medications for this visit.     Objective:  Vitals:    04/21/20 1121   BP: 133/76   Pulse: 89   Resp: 18   Temp: 36.3 ??C (97.3 ??F)   TempSrc: Oral   SpO2: 100%   Weight: 54.3 kg (119 lb 12.8 oz)     Physical Exam: essentially unchanged from previously documented physical exam  General appearance - Thin WF, no distress, bronze coloring.    Mental status - affect appropriate to situation   Eyes - conjunctivae clear, sclera anicteric   ENT - MMM, no oral ulcerations or thrush  Neck - supple, no palpable LAD  Chest - lungs are clear to auscultation bilaterally without wheezes or crackles  Heart - regular rate and rhythm without murmur, rub, or gallop   Abdomen - soft, bowel sounds within normal limits, no masses, no tenderness, no hepatosplenomegaly   Neurological - motor and sensory grossly normal bilaterally  Extremities - no pedal edema noted  Skin - no skin breakdown noted, no rash, petechiae noted on upper chest   Venous access - line site is non-tender, no erythema/drainage    Karnofsky/Lansky Performance Status  80, Normal activity with effort; some signs or symptoms of disease (ECOG equivalent 1)    Test Results:   Reviewed in EPIC. Abnormal values discussed below.     Assessment/Plan:  Susan Kane is 59 yo woman with CLL as well as AA/bone marrow failure.  ??  CLL: CT C/A/P on 10/10/19 revealed no evidence of LAD or organomegaly. Possible endometrial fluid or thickening in the fundus of the uterus.  10/07/19 BMBx: Scant bone marrow sampling (<5% cellular) essentially devoid of hematopoietic elements.  - ??Persistent CLL representing 23% of marrow cellularity by flow cytometric analysis.  -  Corresponding CBC:  WBC 1.0, ANC 0.1.  ????????  ??  12/26/19: BmBx:   - ??Hypocellular (20% overall) involved by chronic lymphocytic leukemia/small lymphocytic lymphoma, representing approximately 20% of marrow cells by PAX5 immunohistochemistry.  - Normal karyotype: 46,XX[20]    02/10/20: BmBx:  -Markedly hypocellular bone marrow (< 5%) with essentially absent hematopoiesis. Normal Recipient and Donor karyotype:  46,XX[2]//46,XY[7].     Repeat bone marrow biopsy done 9/15 and is variably cellular with no evidence of CLL. Plan to repeat on today.    BMT:??SD (stable disease)  HCT-CI (age adjusted) 5??(active CLL, psych, age adjusted)    Conditioning from 1st transplant:  1. Rituximab 375 mg/m2 on day -13 and 1000 mg/m2 on D-6, +1, and +8  2. Bendamustine 130 mg/m2 IV daily over 10 minutes on D-5 through -3  3. Fludarabine 30 mg/m2 IV daily over 30 minutes on D-5 through -3  ??  Donor: 10/10, ABO identical; O - , CMV negative  Cell dose: 5.51x10^6 cells/kg    Engraftment:   - Granix D+12 through WBC recovery (ANC 1.0 x 2 days or 3.0 x 1 day)  - Viral studies showed weakly positive EBV, CMV, HHV-6, plan to monitor for now  - 02/10/20 D+30 BMBx: markedly hypocellular marrow (<5%) with essentially absent hematopoiesis.     Chimerisms:     02/09/2020 00:23 02/10/2020 14:45 03/08/2020 13:49 03/08/2020 13:49 03/15/2020 09:38 03/15/2020 09:38 04/07/2020 10:30 04/07/2020 10:30 04/07/2020 12:45 04/07/2020 12:45   Specimen Type Blood Bone Marrow Blood Blood Blood Blood Blood Blood Bone Marrow Bone Marrow   T-Cell (CD3) Recipient:    >95  >95  >95  >95   T-Cell (CD3) Donor:    <5  <5  <5  <5   Unfract. Recipient: 37 17 14  15  28  10     Unfract. Donor: 63 83 86  85  72  90      GvHD prophylaxis:   1. Tacrolimus goal 5-10, dose adjustments per BMT pharmacy.   ??** Current dose 1 mg in the am and 1.5mg  qpm  2. Completed Methotrexate 5 mg/m2 on D+1, +3, +6 and +11  3. rATG 1 mg/kg on D-2 and -1 included  ??  Heme:  Transfusion criteria: ABO identical donor [O-]   - Standard txf criteria: 1 unit of PRBCs for Hgb<7 and 1 unit of platelets for Plt <10K or bleeding.     ** No history of transfusion reactions.   - Granix for ANC of 1 or less [tried Udenyca but did not get a lasting result]    ** Given 8/27, 8/30, 9/3, 9/8, 9/10, 04/05/20, 9/17 (holding off as she has not had recent response)  - Has been requiring PRBCs and platelets weekly at least.  - . Stopped promacta 9/22/21per Dr. Oswald Hillock. BM bx next week.    **Plt  today.     ID:????  Prophylaxis:  - Antiviral: Valtrex 500mg  daily.   - Antifungal: Posaconazole 300 mg daily  - Antibacterial:??Starting Cefdinir prophy [04/05/20]     - PJP: Montly pentamidine [7/31, 03/31/20].    CMV:   03/02/20: CMV 884. Valganciclovir started.    03/08/20: CMV decreased after 1 week of therapy to 140.  03/17/20: Last CMV <50. Decreased valcyte to 450 mg BID.   ** Letermovir denied. Appeal in progress.    03/22/20: CMV not detected. Maintenance dosing ends on 9/8  03/31/20: D/C Valcyte. CMV negative.   ??  HHV-6:   - Peaked on PCR at 41.2, now negative.   ??  EBV:   - Weekly, negative 03/31/20   ??  GI:??  -Decreased appetite.     ** Weight is stable.   ??  Loose stools:  -9/22: with cramping. Will send CDiff if able to produce a sample in clinic today.  -9/24: diarrhea better with no intervention    Nausea:  - Zofran 8mg  prn [taking bid]  ??  Renal:   04/21/20: SCr WNL  ??  FEN:   - Continue folic acid.    Hypomagnesemia:  - Mag Chelate 1 tablet daily.   ??  Endocrine:  Hypothyroid:??  - Continue Synthroid daily.   - TSH, T3, T4 - normal   ??  Hepatic:??  - VOD prophylaxis not warranted as non-myeloablative regimen.   ??  Hepatocellular liver injury pattern:  - Mild increase in ALT, and AlkPhos, likely secondary to posaconazole.     ** Overall stable.   ??  Neuro/Pain:??  - No active issues.    Bone Health:   03/05/20: DEXA scan results:  - spine L1 - L4 T score -1.4  - L proximal femur T score -1.9  - L femoral neck T score -1.8  Based on DEXA scan, Susan Kane has osteopenia of spine, femur and femoral neck. Recommend administering zoledronic acid 4 mg IV every 3 months x 2 doses in addition to calcium and vitamin D supplementation 600 mg/ 800 units twice daily.   - Zometa given [8/18], final dose due in November.  ??  Psych:??Has a home psychiatrist. Alanson Aly [phone].   Depression/Anxiety:   - Per outpt note pt was taking Xanax 0.25 mg BID PRN for anxiety.  - Trintellix 10mg  nightly  - Lithium 300mg  nightly   04/02/20: Requested lithium level [last checked in June]     ** Dr. Jennelle Human was updated with results [04/05/20].    ??  Insomnia:  - Ambien 10mg  nightly PRN  ??  Caregiver/Lodging:  - Spouse [name] is her primary caregiver  - Staying at [facility].     Summary:  - Pancytopenia with neutropenia.   - Next week will see provider onTuesday. She will have labs on Friday and Sunday with no provider visit.    - Repeat INR 1.4. We have done 2 rounds of vit k with no change.  She is not having bleeding so we will just monitor for now.   - 2nd transplant delayed till the [facility] results are back from the biopsy that will be done next week.      Dimple Nanas, ANP  Bone Marrow Transplant and Cellular Therapy Program    Future Appointments   Date Time Provider Department Center   04/21/2020 12:30 PM Libby Maw, ANP [id number]   04/22/2020  1:00 PM ONCBMT LABS [facility]   04/22/2020  2:00 PM ONCINF CHAIR 05 [id number]   04/23/2020 11:30 AM ONCBMT LABS [facility]   04/23/2020 12:00 PM ONCINF CHAIR 03 [id number]   04/27/2020 10:15 AM ONCBMT LABS [facility]   04/27/2020 10:45 AM Artelia Laroche, MD [facility]   04/28/2020 11:30 AM ONCINF CHAIR 06 [id number]   06/08/2020 11:00 AM ONCINF CHAIR 04 [id number]     I personally spent 60 minutes face-to-face and non-face-to-face in the care of this Susan Kane, which includes all pre, intra, and post visit time on the date of service.

## 2020-04-21 NOTE — Unmapped (Signed)
-  Please do not drive for 24 hours after the procedure if you received medications through your IV.  -Keep the biopsy site dry and covered for 24 hours.   -After 24 hours, you may remove the dressing and cover the site with a bandage.  -Call the clinic if you have: bleeding from the site that does not stop after 10 minutes of firm pressure, redness and/or puss drainage from the site, and fever.      BMT clinic: [phone]  Inpatient unit: [phone] (call this number if after hours)    Lab Results   Component Value Date    WBC 0.4 (LL) 04/21/2020    HGB 9.2 (L) 04/21/2020    HCT 25.7 (L) 04/21/2020    PLT 8 (LL) 04/21/2020       Lab Results   Component Value Date    NA 137 04/21/2020    K 3.4 04/21/2020    CL 105 04/21/2020    CO2 26.0 04/21/2020    BUN 24 (H) 04/21/2020    CREATININE 1.27 (H) 04/21/2020    GLU 211 (H) 04/21/2020    CALCIUM 9.1 04/21/2020    MG 1.8 04/21/2020    PHOS 3.6 03/01/2020       Lab Results   Component Value Date    BILITOT 0.7 04/21/2020    BILIDIR 0.30 03/01/2020    PROT 6.0 04/21/2020    ALBUMIN 3.8 04/21/2020    ALT 104 (H) 04/21/2020    AST 68 (H) 04/21/2020    ALKPHOS 114 04/21/2020    GGT 22 01/09/2020       Lab Results   Component Value Date    PT 16.8 (H) 04/19/2020    INR 1.44 04/19/2020    APTT 59.9 (H) 04/07/2020

## 2020-04-22 DIAGNOSIS — C9110 Chronic lymphocytic leukemia of B-cell type not having achieved remission: Secondary | ICD-10-CM | POA: Diagnosis not present

## 2020-04-22 DIAGNOSIS — D801 Nonfamilial hypogammaglobulinemia: Secondary | ICD-10-CM | POA: Diagnosis not present

## 2020-04-22 DIAGNOSIS — Z9484 Stem cells transplant status: Secondary | ICD-10-CM | POA: Diagnosis not present

## 2020-04-22 DIAGNOSIS — D619 Aplastic anemia, unspecified: Secondary | ICD-10-CM | POA: Diagnosis not present

## 2020-04-22 DIAGNOSIS — C9112 Chronic lymphocytic leukemia of B-cell type in relapse: Principal | ICD-10-CM

## 2020-04-22 LAB — EBV VIRAL LOAD RESULT: Lab: NOT DETECTED

## 2020-04-22 MED ORDER — FAMOTIDINE 20 MG TABLET
ORAL_TABLET | Freq: Two times a day (BID) | ORAL | 0 refills | 30.00000 days | PRN
Start: 2020-04-22 — End: 2020-05-22

## 2020-04-22 MED ORDER — POSACONAZOLE 100 MG TABLET,DELAYED RELEASE
ORAL_TABLET | Freq: Every day | ORAL | 1 refills | 30 days
Start: 2020-04-22 — End: ?

## 2020-04-22 NOTE — Unmapped (Signed)
Patient arrived to chair 5.  No complaints noted.  Access of CVC intact positive blood return.     Patient handed off from [facility] to receive 1 unit of plt, this would be the 2nd unit of the day.     Patient declined to stay for post plt count as they are coming back on Friday.    Patient completed and tolerated treatment.  AVS declined and patient discharged to home.

## 2020-04-22 NOTE — Unmapped (Signed)
Encounter addended by: Meredith Mody, RN on: 04/21/2020 5:20 PM   Actions taken: Clinical Note Signed, Flowsheet accepted, Charge Capture section accepted

## 2020-04-23 ENCOUNTER — Encounter: Admit: 2020-04-23 | Discharge: 2020-04-23 | Payer: PRIVATE HEALTH INSURANCE

## 2020-04-23 DIAGNOSIS — Z9484 Stem cells transplant status: Secondary | ICD-10-CM | POA: Diagnosis not present

## 2020-04-23 DIAGNOSIS — C9110 Chronic lymphocytic leukemia of B-cell type not having achieved remission: Secondary | ICD-10-CM | POA: Diagnosis not present

## 2020-04-23 LAB — COMPREHENSIVE METABOLIC PANEL
ALBUMIN: 3.9 g/dL (ref 3.4–5.0)
ALT (SGPT): 139 U/L — ABNORMAL HIGH (ref 10–49)
ANION GAP: 5 mmol/L (ref 5–14)
AST (SGOT): 79 U/L — ABNORMAL HIGH (ref <=34)
BILIRUBIN TOTAL: 0.7 mg/dL (ref 0.3–1.2)
BLOOD UREA NITROGEN: 23 mg/dL (ref 9–23)
BUN / CREAT RATIO: 19
CALCIUM: 9.3 mg/dL (ref 8.7–10.4)
CHLORIDE: 105 mmol/L (ref 98–107)
CO2: 27 mmol/L (ref 20.0–31.0)
CREATININE: 1.19 mg/dL — ABNORMAL HIGH
EGFR CKD-EPI AA FEMALE: 57 mL/min/{1.73_m2} — ABNORMAL LOW (ref >=60)
EGFR CKD-EPI NON-AA FEMALE: 50 mL/min/{1.73_m2} — ABNORMAL LOW (ref >=60)
GLUCOSE RANDOM: 188 mg/dL — ABNORMAL HIGH (ref 70–179)
POTASSIUM: 3.7 mmol/L (ref 3.4–4.5)
SODIUM: 137 mmol/L (ref 135–145)

## 2020-04-23 LAB — CBC W/ AUTO DIFF
BASOPHILS ABSOLUTE COUNT: 0 10*9/L (ref 0.0–0.1)
BASOPHILS RELATIVE PERCENT: 0.2 %
EOSINOPHILS ABSOLUTE COUNT: 0 10*9/L (ref 0.0–0.4)
EOSINOPHILS RELATIVE PERCENT: 0.4 %
HEMATOCRIT: 24.6 % — ABNORMAL LOW (ref 36.0–46.0)
HEMOGLOBIN: 8.7 g/dL — ABNORMAL LOW (ref 12.0–16.0)
LARGE UNSTAINED CELLS: 12 % — ABNORMAL HIGH (ref 0–4)
LYMPHOCYTES ABSOLUTE COUNT: 0.2 10*9/L — ABNORMAL LOW (ref 1.5–5.0)
LYMPHOCYTES RELATIVE PERCENT: 31.7 %
MEAN CORPUSCULAR VOLUME: 80.5 fL (ref 80.0–100.0)
MEAN PLATELET VOLUME: 9.1 fL (ref 7.0–10.0)
MONOCYTES RELATIVE PERCENT: 21.3 %
NEUTROPHILS ABSOLUTE COUNT: 0.2 10*9/L — CL (ref 2.0–7.5)
NEUTROPHILS RELATIVE PERCENT: 34.4 %
PLATELET COUNT: 40 10*9/L — ABNORMAL LOW (ref 150–440)
RED BLOOD CELL COUNT: 3.05 10*12/L — ABNORMAL LOW (ref 4.00–5.20)
RED CELL DISTRIBUTION WIDTH: 14.3 % (ref 12.0–15.0)
WBC ADJUSTED: 0.6 10*9/L — ABNORMAL LOW (ref 4.5–11.0)

## 2020-04-23 LAB — MAGNESIUM: Magnesium:MCnc:Pt:Ser/Plas:Qn:: 1.7

## 2020-04-23 LAB — TACROLIMUS, TROUGH: Lab: 4.6 — ABNORMAL LOW

## 2020-04-23 LAB — SMEAR REVIEW

## 2020-04-23 LAB — MEAN CORPUSCULAR HEMOGLOBIN: Erythrocyte mean corpuscular hemoglobin:EntMass:Pt:RBC:Qn:Automated count: 28.6

## 2020-04-23 LAB — BUN / CREAT RATIO: Urea nitrogen/Creatinine:MRto:Pt:Ser/Plas:Qn:: 19

## 2020-04-23 MED ORDER — POSACONAZOLE 100 MG TABLET,DELAYED RELEASE
ORAL_TABLET | Freq: Every day | ORAL | 1 refills | 30.00000 days | Status: CP
Start: 2020-04-23 — End: ?
  Filled 2020-04-27: qty 90, 30d supply, fill #0

## 2020-04-23 MED ADMIN — heparin, porcine (PF) 100 unit/mL injection 200 Units: 200 [IU] | INTRAVENOUS | @ 15:00:00 | Stop: 2020-04-23

## 2020-04-23 MED FILL — FOLIC ACID 1 MG TABLET: ORAL | 30 days supply | Qty: 30 | Fill #2

## 2020-04-23 MED FILL — FAMOTIDINE 20 MG TABLET: 30 days supply | Qty: 60 | Fill #0 | Status: AC

## 2020-04-23 MED FILL — CEFDINIR 300 MG CAPSULE: 5 days supply | Qty: 10 | Fill #2 | Status: AC

## 2020-04-23 MED FILL — FAMOTIDINE 20 MG TABLET: ORAL | 30 days supply | Qty: 60 | Fill #0

## 2020-04-23 MED FILL — FOLIC ACID 1 MG TABLET: 30 days supply | Qty: 30 | Fill #2 | Status: AC

## 2020-04-23 MED FILL — CEFDINIR 300 MG CAPSULE: ORAL | 5 days supply | Qty: 10 | Fill #2

## 2020-04-23 NOTE — Unmapped (Signed)
Encounter addended by: Natale Lay McMillion on: 04/23/2020 4:08 PM   Actions taken: Order list changed

## 2020-04-24 DIAGNOSIS — Z9484 Stem cells transplant status: Principal | ICD-10-CM

## 2020-04-24 DIAGNOSIS — D619 Aplastic anemia, unspecified: Principal | ICD-10-CM

## 2020-04-25 ENCOUNTER — Encounter: Admit: 2020-04-25 | Discharge: 2020-04-26 | Payer: PRIVATE HEALTH INSURANCE

## 2020-04-25 DIAGNOSIS — D619 Aplastic anemia, unspecified: Secondary | ICD-10-CM | POA: Diagnosis not present

## 2020-04-25 DIAGNOSIS — D849 Immunodeficiency, unspecified: Secondary | ICD-10-CM | POA: Diagnosis not present

## 2020-04-25 DIAGNOSIS — Z9484 Stem cells transplant status: Secondary | ICD-10-CM | POA: Diagnosis not present

## 2020-04-25 LAB — CBC W/ AUTO DIFF
BASOPHILS ABSOLUTE COUNT: 0 10*9/L (ref 0.0–0.1)
EOSINOPHILS ABSOLUTE COUNT: 0 10*9/L (ref 0.0–0.4)
HEMATOCRIT: 20.5 % — ABNORMAL LOW (ref 36.0–46.0)
HEMOGLOBIN: 7.5 g/dL — ABNORMAL LOW (ref 12.0–16.0)
LARGE UNSTAINED CELLS: 19 % — ABNORMAL HIGH (ref 0–4)
LYMPHOCYTES ABSOLUTE COUNT: 0.1 10*9/L — ABNORMAL LOW (ref 1.5–5.0)
LYMPHOCYTES RELATIVE PERCENT: 20 %
MEAN CORPUSCULAR HEMOGLOBIN CONC: 36.5 g/dL (ref 31.0–37.0)
MEAN CORPUSCULAR HEMOGLOBIN: 29.3 pg (ref 26.0–34.0)
MEAN CORPUSCULAR VOLUME: 80.3 fL (ref 80.0–100.0)
MEAN PLATELET VOLUME: 10.2 fL — ABNORMAL HIGH (ref 7.0–10.0)
MONOCYTES ABSOLUTE COUNT: 0.1 10*9/L — ABNORMAL LOW (ref 0.2–0.8)
MONOCYTES RELATIVE PERCENT: 19.4 %
NEUTROPHILS ABSOLUTE COUNT: 0.2 10*9/L — CL (ref 2.0–7.5)
NEUTROPHILS RELATIVE PERCENT: 40 %
PLATELET COUNT: 17 10*9/L — ABNORMAL LOW (ref 150–440)
RED BLOOD CELL COUNT: 2.55 10*12/L — ABNORMAL LOW (ref 4.00–5.20)
RED CELL DISTRIBUTION WIDTH: 14.5 % (ref 12.0–15.0)
WBC ADJUSTED: 0.4 10*9/L — CL (ref 4.5–11.0)

## 2020-04-25 LAB — SMEAR REVIEW

## 2020-04-25 LAB — MAGNESIUM: Magnesium:MCnc:Pt:Ser/Plas:Qn:: 1.7

## 2020-04-25 LAB — COMPREHENSIVE METABOLIC PANEL
ALBUMIN: 3.9 g/dL (ref 3.4–5.0)
ALKALINE PHOSPHATASE: 112 U/L (ref 46–116)
ALT (SGPT): 125 U/L — ABNORMAL HIGH (ref 10–49)
ANION GAP: 7 mmol/L (ref 5–14)
AST (SGOT): 58 U/L — ABNORMAL HIGH (ref <=34)
BILIRUBIN TOTAL: 0.7 mg/dL (ref 0.3–1.2)
BLOOD UREA NITROGEN: 19 mg/dL (ref 9–23)
BUN / CREAT RATIO: 17
CALCIUM: 9.4 mg/dL (ref 8.7–10.4)
CHLORIDE: 103 mmol/L (ref 98–107)
CO2: 27 mmol/L (ref 20.0–31.0)
CREATININE: 1.15 mg/dL — ABNORMAL HIGH
EGFR CKD-EPI AA FEMALE: 60 mL/min/{1.73_m2} (ref >=60)
EGFR CKD-EPI NON-AA FEMALE: 52 mL/min/{1.73_m2} — ABNORMAL LOW (ref >=60)
POTASSIUM: 3.6 mmol/L (ref 3.4–4.5)
PROTEIN TOTAL: 6 g/dL (ref 5.7–8.2)

## 2020-04-25 LAB — LYMPHOCYTES ABSOLUTE COUNT: Lymphocytes:NCnc:Pt:Bld:Qn:Automated count: 0.1 — ABNORMAL LOW

## 2020-04-25 LAB — CO2: Carbon dioxide:SCnc:Pt:Ser/Plas:Qn:: 27

## 2020-04-25 NOTE — Unmapped (Signed)
1610: Pt here for scheduled lab check.     1145: Pt tolerated treatment/transfusion W/O difficulty. Hickman flushed and Heparin locked   Pt left infusion center ambulatory. NAD, no questions nor complaints voiced at D/C. Pt aware of follow up.

## 2020-04-26 NOTE — Unmapped (Unsigned)
BMT-CT Clinic Pre- Admit    Referring Physician: Meredith Pel) [name], MD   Primary Care Provider: Thora Lance, MD  BMT Attending MD: Lanae Boast, MD    Disease: CLL  Current disease status: SD (stable disease)  Type of Transplant: RIC MUD Allo  Graft Source: Cryopreserved PBSCs  Transplant Day: +98    Study Participant: BMT CTN 1704: Composite Health Assessment Risk Model for Older Adults: Applying Pre-transplant Comorbidity, Geriatric Assessment and Biomarkers to Predict Non-Relapse Mortality After Allogeneic Transplant (CHARM).  ??  HPI:  Susan Kane??is a 59 y.o.??woman??with a diagnosis of CLL??and aplastic anemia who is now s/p RIC MUD AlloSCT. Her transplant course was complicated by delayed engraftment. ??She was initially discharged from the hospital on 02/23/2020. However was re-admitted on 02/27/20 with a fever and need for workup and IV cefepime. Infectious workup was unrevealing, IV cefepime was stopped on 03/02/20, and she was once again discharged on 03/03/20.  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 Currently planning for second transplant.      Interval History: Susan Kane presents today with her husband for discussion of upcoming re-admission for 2nd transplant. with her husband for routine clinic follow up today.  ***      Oncology History Overview Note   CLL:    Summary as per Dr. Kalman Drape most recent note with minor edits/additions from my review of records    1.??CLL?????diagnosed in August 2010, flow cytometry consistent with CLL  ?? Enlarged left??inguinal lymph node January 2019,??small??neck/axillary nodes and palpable splenomegaly 09/11/2017  ?? CTs??on 09/17/2017-3 cm necrotic appearing lymph node in the left inguinal region, borderline enlarged pelvic/retroperitoneal, chest, and axillary nodes. ??Mild splenomegaly.  ?? Ultrasound-guided biopsy of the left inguinal lymph node 09/18/2017, slightly purulent fluid aspirated, core biopsy is consistent with an atypical lymphoid proliferation???extensive necrosis with surrounding epithelioid histiocytes, limited intact lymphoid tissue involved with CLL  ?? Incisional biopsy of a necrotic/purulent left inguinal lymph node on 10/01/2017???extensive necrosis with granulomatous inflammation, small amount of viable lymphoid tissue involved with CLL, AFB and fungal stains negative  ?? Peripheral blood FISH analysis 02/05/2018??? +deletion 13q14, no evidence of p53 (17p13) deletion, no evidence of 11q22??deletion  - normal karyotype (46,XX)  ?? Bone marrow biopsy 02/26/2018???hypercellular marrow with extensive involvement by CLL, lymphocytes represent??85% of all cells  ?? Ibrutinib initiated 04/03/2018  ?? Ibrutinib placed on hold 04/11/2018 due to onset of arthralgias  ?? Ibrutinib resumed 04/16/2018, discontinued 04/25/2018 secondary to severe arthralgias/arthritis  ?? Ibrutinib resumed at a dose of 140 mg daily 05/03/2018  ?? Ibrutinib dose adjusted to 140 mg alternating with 280 mg 06/25/2018  ?? 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 ??Augmentin prescribed.  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    Baseline BM bx reviewed at [facility]  Final Diagnosis   (Outside Case #:  [id number], dated 02/26/2018)  Bone marrow, aspiration and biopsy  -  Hypercellular bone marrow (80%) with extensive involvement by chronic lymphocytic leukemia (87% lymphocytes by manual aspirate differential)  (See Comment)  -  By outside report, cytogenetic results are normal     Karyotype: 86, XX and FISH with 13q del but no 11q  or 17p del    Repeat BM bx: (done after being off ibrutinib x1 month)  80% involvement by CLL    08/13/18: Presents to [facility] to discuss plan of care     Chronic lymphocytic leukemia (CLL), B-cell (CMS-HCC)   07/12/2018 Initial Diagnosis    Chronic lymphocytic leukemia (CLL), B-cell (CMS-HCC)     08/26/2018 -  Chemotherapy    Acalabrutinib started (approximate date)     11/14/2018 -  Chemotherapy    Acalabrutinib discontinued due to progressive bone marrow failure     12/06/2018 -  Chemotherapy    Ritux x1 given due to CLL still making up majority of BM cellularity (though in the setting of near aplasia of rest of TLH)     03/19/2019 -  Chemotherapy    Equine ATG / CsA for aplastic anemia (suspect 2/t acalabrutinib)     Chronic lymphocytic leukemia of B-cell type not having achieved remission (CMS-HCC)   01/21/2019 Initial Diagnosis    Chronic lymphocytic leukemia of B-cell type not having achieved remission (CMS-HCC)     01/01/2020 -  Chemotherapy    BMT IP BENDAMUSTINE / FLUDARABINE / RITUXIMAB (OP RITUXIMAB Days -13 & -6) (MUD)  bendamustine 130 mg/m2 IV on days -5, -4, -3   fludarabine 30 mg/m2 IV on days -5, -4, -3   riTUXimab 375 mg/m2 IV on day -13 as a OP treatment day, then 1,000 mg/m2 on days -6 as a OP treatment day, +1, +8   tacrolimus, 0.045 mg/kg PO BID on days -3 and -2 then  0.03 mg/kg PO BID starting on day -1   methotrexate 5 mg/m2 IV on days +1, +3, +6, +11   tbo-filgrastim 300 mcg (<= 75 kg) / 480 mcg (> 75 kg) q24h starting on day +7        Patient Active Problem List   Diagnosis   ??? Chronic lymphocytic leukemia (CLL), B-cell (CMS-HCC)   ??? Hypogammaglobulinemia (CMS-HCC)   ??? Chronic lymphocytic leukemia of B-cell type not having achieved remission (CMS-HCC)   ??? Aplastic anemia (CMS-HCC)   ??? 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 Drug-induced nausea and vomiting   ??? Stem cell transplant candidate   ??? Chemotherapy induced neutropenia (CMS-HCC)   ??? History of allogeneic stem cell transplant (CMS-HCC)   ??? Osteopenia   ??? Neutropenia, drug-induced (CMS-HCC)   ??? Immunosuppressed status (CMS-HCC)   ??? Bone marrow hypocellularity (CMS-HCC)   ??? Neutropenia (CMS-HCC)     Review of Systems:  A comprehensive ROS performed and is negative except for pertinent positives as listed above in interval history.     I reviewed and updated past medical, surgical, social, and family history as appropriate.        No Known Allergies  Current Outpatient Medications   Medication Sig Dispense Refill   ??? calcium carbonate-vitamin D3 600 mg(1,500mg ) -800 unit Tab Take 1 tablet by mouth Two (2) times a day. (Patient not taking: Reported on 03/05/2020) 60 tablet 5   ??? cefdinir (OMNICEF) 300 MG capsule Take 1 capsule (300 mg total) by mouth Two (2) times a day. 60 capsule 1   ??? famotidine (PEPCID) 20 MG tablet Take 1 tablet (20 mg total) by mouth two (2) times a day as needed for heartburn. 60 tablet 0   ??? folic acid (FOLVITE) 1 MG tablet Take 1 tablet (1 mg total) by mouth at bedtime. 30 tablet 5   ??? levothyroxine (SYNTHROID) 100 MCG tablet Take 1 tablet (  100 mcg total) by mouth daily. 30 tablet 5   ??? lithium (LITHOBID) 300 MG ER tablet Take 1 tablet (300 mg total) by mouth at bedtime. 30 tablet 5   ??? magnesium oxide-Mg AA chelate (MAGNESIUM, AMINO ACID CHELATE,) 133 mg Tab Take 1 tablet by mouth daily. 100 tablet 2   ??? ondansetron (ZOFRAN) 8 MG tablet Take 1 tablet (8 mg total) by mouth every twelve (12) hours as needed for nausea. 30 tablet 2   ??? polyethylene glycol (MIRALAX) 17 gram packet Take 17 g by mouth daily as needed (constipation).     ??? posaconazole (NOXAFIL) 100 mg TbEC delayed released tablet Take 3 tablets (300 mg total) by mouth daily. 90 tablet 1   ??? tacrolimus (PROGRAF) 0.5 MG capsule Take 2 capsules (1 mg total) by mouth daily AND 3 capsules (1.5 mg total) nightly. 150 capsule 5   ??? TRINTELLIX 10 mg tablet Take 1 tablet (10 mg total) by mouth at bedtime. 30 tablet 5   ??? valACYclovir (VALTREX) 500 MG tablet Take 1 tablet (500 mg total) by mouth daily. 30 tablet 11   ??? zolpidem (AMBIEN) 5 MG tablet Take 1 to 2 tablets (5 mg-10 mg) as needed for insomnia. 60 tablet 0     No current facility-administered medications for this visit.     Objective:  There were no vitals filed for this visit.     Physical Exam:   General: No acute distress noted.   Central venous access: Line clean, dry, intact. No erythema or drainage noted.   ENT: Moist mucous membranes. Oropharhynx without lesions, erythema or exudate.   Cardiovascular: Pulse normal rate, regularity and rhythm. S1 and S2 normal, without any murmur, rub, or gallop.  Lungs: Clear to auscultation bilaterally, without wheezes/crackles/rhonchi. Good air movement.   Skin: Warm, dry, intact. No rash noted.    Psychiatry: Alert and oriented to person, place, and time.   Gastrointestinal/Abdomen: Normoactive bowel sounds, abdomen soft, non-tender   Musculoskeletal/Extremities: FROM throughout. No edema  Neurologic: CNII-XII intact. Normal strength and sensation throughout    Karnofsky/Lansky Performance Status  80, Normal activity with effort; some signs or symptoms of disease (ECOG equivalent 1)    Test Results:   I reviewed all labs from today in Epic. See EMR for lab results.    Assessment/Plan:  Mrs. Susan Kane is 59 yo woman with CLL as well as AA/bone marrow failure.  ??  CLL: CT C/A/P on 10/10/19 revealed no evidence of LAD or organomegaly. Possible endometrial fluid or thickening in the fundus of the uterus.  10/07/19 BMBx: Scant bone marrow sampling (<5% cellular) essentially devoid of hematopoietic elements.  - ??Persistent CLL representing 23% of marrow cellularity by flow cytometric analysis.  -  Corresponding CBC: WBC 1.0, ANC 0.1.  ????????  ??  12/26/19: BmBx:   - ??Hypocellular (20% overall) involved by chronic lymphocytic leukemia/small lymphocytic lymphoma, representing approximately 20% of marrow cells by PAX5 immunohistochemistry.  - Normal karyotype: 46,XX[20]    02/10/20: BmBx:  -Markedly hypocellular bone marrow (< 5%) with essentially absent hematopoiesis. Normal Recipient and Donor karyotype:  46,XX[2]//46,XY[7].     Repeat bone marrow biopsy done 9/15 and is variably cellular with no evidence of CLL, 8% blasts but unclear if due to recent Granix    Repeat BMBX 04/21/20 Cellular bone marrow (50% in clot sections) including trilineage hematopoiesis with left-shifted granulopoiesis (17% blasts by manual aspirate differential  and less than 5% blasts by CD34 immunohistochemistry)   -  No  overt morphologic or flow cytometric evidence of lymphoma       BMT:??SD (stable disease)  HCT-CI (age adjusted) 5??(active CLL, psych, age adjusted)    Conditioning from 1st transplant:  1. Rituximab 375 mg/m2 on day -13 and 1000 mg/m2 on D-6, +1, and +8  2. Bendamustine 130 mg/m2 IV daily over 10 minutes on D-5 through -3  3. Fludarabine 30 mg/m2 IV daily over 30 minutes on D-5 through -3    Conditioning from 2nd transplant:  ***  ??  Donor: 10/10, ABO identical; O - , CMV negative  Cell dose: 5.51x10^6 cells/kg    Engraftment:   Granix ***    Chimerisms:   PB UF PB CD3 BM UF BM CD3   02/09/20 63% donor insufficient     02/10/20   83% donor Insufficient   03/08/20 86% donor <5% donor     03/15/20 85% donor <5% donor     04/07/20 72% donor <5% donor 90% donor <5% donor   04/21/20   92% donor 17% donor                          GvHD prophylaxis  1st transplant:   1. Tacrolimus  2. Completed Methotrexate 5 mg/m2 on D+1, +3, +6 and +11  3. rATG 1 mg/kg on D-2 and -1 included    2nd Transplant:  ***    Heme:  Transfusion criteria: ABO identical donor [O-]   - Standard txf criteria: 1 unit of PRBCs for Hgb<7 and 1 unit of platelets for Plt <10K or bleeding.     ** No history of transfusion reactions.   - Granix for ANC of 1 or less [tried Udenyca but did not get a lasting result]    ** Given 8/27, 8/30, 9/3, 9/8, 9/10, 04/05/20, 9/17 (holding off as she has not had recent response)  - Has been requiring PRBCs and platelets weekly at least.  - . Stopped promacta 9/22/21per Dr. Oswald Hillock, prior to [facility] on 04/21/20.     ***    ID:????  Prophylaxis:  - Antiviral: Continue Valtrex 500mg  daily on admit.    - Antifungal: Posaconazole 300 mg daily ***  - Antibacterial:??Continue Cefdinir prophy [04/05/20]     - PJP: Montly pentamidine [7/31, 03/31/20]. ***    CMV:   03/02/20: CMV 884. Valganciclovir started.    03/08/20: CMV decreased after 1 week of therapy to 140.  03/17/20: Last CMV <50. Decreased valcyte to 450 mg BID.   ** Letermovir denied. Appeal in progress.    03/22/20: CMV not detected.  03/31/20: D/C Valcyte. CMV negative.   ??  HHV-6:   - Peaked on PCR at 41.2, now negative.   ??  EBV:   - Weekly, remains negative.   ??  GI:??  Loose stools:  ***    Nausea:  - Zofran 8mg  prn [taking bid]  ??  Renal:   04/21/20: SCr WNL  ??  FEN:   - Continue folic acid.    Hypomagnesemia:  - Mag Chelate 1 tablet daily.   ??  Endocrine:  Hypothyroid:??  - Continue Synthroid daily.   - TSH, T3, T4 - normal   ??  Hepatic:??  - VOD prophylaxis not warranted as non-myeloablative regimen.   ??  Hepatocellular liver injury pattern:  - Mild increase in ALT, and AlkPhos, likely secondary to posaconazole.     ** Overall stable.   ??  Neuro/Pain:??  - No  active issues.    Bone Health:   03/05/20: DEXA scan results:  - spine L1 - L4 T score -1.4  - L proximal femur T score -1.9  - L femoral neck T score -1.8  Based on DEXA scan, patient has osteopenia of spine, femur and femoral neck. Recommend administering zoledronic acid 4 mg IV every 3 months x 2 doses in addition to calcium and vitamin D supplementation 600 mg/ 800 units twice daily.   - Zometa given [8/18], final dose due in November.  ??  Psych:??Has a home psychiatrist. Alanson Aly [phone].   Depression/Anxiety:   - Per outpt note pt was taking Xanax 0.25 mg BID PRN for anxiety.  - Trintellix 10mg  nightly  - Lithium 300mg  nightly   04/02/20: Requested lithium level [last checked in June]     ** Dr. Jennelle Human was updated with results [04/05/20].    ??  Insomnia:  - Ambien 10mg  nightly PRN  ??  Caregiver/Lodging:  - Spouse [name] is her primary caregiver  - Staying at local rental.     Summary:  ***    Barnetta Hammersmith, PA  Bone Marrow Transplant and Cellular Therapy Program    Future Appointments   Date Time Provider Department Center   04/27/2020 10:15 AM ONCBMT LABS [facility]   04/27/2020 10:45 AM Artelia Laroche, MD [facility]   04/27/2020 12:15 PM ONCBMT APP B [facility]   04/27/2020 12:30 PM Rulon Abide, PharmD CPP [facility]   04/27/2020  1:00 PM Renda [place], RD/LDN DI[facility]   04/28/2020 11:30 AM ONCINF CHAIR 06 [id number] [facility]   06/08/2020 11:00 AM ONCINF CHAIR 04 [id number]     I personally spent 60 minutes face-to-face and non-face-to-face in the care of this patient, which includes all pre, intra, and post visit time on the date of service.

## 2020-04-27 ENCOUNTER — Ambulatory Visit: Admit: 2020-04-27 | Discharge: 2020-04-28 | Payer: PRIVATE HEALTH INSURANCE

## 2020-04-27 ENCOUNTER — Encounter: Admit: 2020-04-27 | Discharge: 2020-04-28 | Payer: PRIVATE HEALTH INSURANCE

## 2020-04-27 ENCOUNTER — Ambulatory Visit
Admit: 2020-04-27 | Discharge: 2020-04-28 | Payer: PRIVATE HEALTH INSURANCE | Attending: Pharmacist Clinician (PhC)/ Clinical Pharmacy Specialist | Primary: Pharmacist Clinician (PhC)/ Clinical Pharmacy Specialist

## 2020-04-27 ENCOUNTER — Encounter
Admit: 2020-04-27 | Discharge: 2020-04-28 | Payer: PRIVATE HEALTH INSURANCE | Attending: Registered" | Primary: Registered"

## 2020-04-27 DIAGNOSIS — Z9484 Stem cells transplant status: Secondary | ICD-10-CM | POA: Diagnosis not present

## 2020-04-27 DIAGNOSIS — D61810 Antineoplastic chemotherapy induced pancytopenia: Secondary | ICD-10-CM | POA: Diagnosis not present

## 2020-04-27 DIAGNOSIS — D84822 Immunodeficiency due to external causes: Secondary | ICD-10-CM | POA: Diagnosis not present

## 2020-04-27 DIAGNOSIS — C9110 Chronic lymphocytic leukemia of B-cell type not having achieved remission: Secondary | ICD-10-CM | POA: Diagnosis not present

## 2020-04-27 DIAGNOSIS — D619 Aplastic anemia, unspecified: Principal | ICD-10-CM

## 2020-04-27 LAB — COMPREHENSIVE METABOLIC PANEL
ALBUMIN: 4 g/dL (ref 3.4–5.0)
ALKALINE PHOSPHATASE: 116 U/L (ref 46–116)
ALT (SGPT): 107 U/L — ABNORMAL HIGH (ref 10–49)
ANION GAP: 5 mmol/L (ref 5–14)
AST (SGOT): 48 U/L — ABNORMAL HIGH (ref <=34)
BILIRUBIN TOTAL: 0.9 mg/dL (ref 0.3–1.2)
BLOOD UREA NITROGEN: 18 mg/dL (ref 9–23)
CALCIUM: 9.4 mg/dL (ref 8.7–10.4)
CHLORIDE: 108 mmol/L — ABNORMAL HIGH (ref 98–107)
CO2: 26 mmol/L (ref 20.0–31.0)
CREATININE: 1.17 mg/dL — ABNORMAL HIGH
EGFR CKD-EPI AA FEMALE: 59 mL/min/{1.73_m2} — ABNORMAL LOW (ref >=60)
EGFR CKD-EPI NON-AA FEMALE: 51 mL/min/{1.73_m2} — ABNORMAL LOW (ref >=60)
GLUCOSE RANDOM: 129 mg/dL (ref 70–179)
POTASSIUM: 4 mmol/L (ref 3.4–4.5)
PROTEIN TOTAL: 6.4 g/dL (ref 5.7–8.2)
SODIUM: 139 mmol/L (ref 135–145)

## 2020-04-27 LAB — CBC W/ AUTO DIFF
BASOPHILS ABSOLUTE COUNT: 0 10*9/L (ref 0.0–0.1)
BASOPHILS RELATIVE PERCENT: 0.5 %
EOSINOPHILS ABSOLUTE COUNT: 0 10*9/L (ref 0.0–0.4)
EOSINOPHILS RELATIVE PERCENT: 0.2 %
HEMATOCRIT: 25.3 % — ABNORMAL LOW (ref 36.0–46.0)
HEMOGLOBIN: 9.1 g/dL — ABNORMAL LOW (ref 12.0–16.0)
LARGE UNSTAINED CELLS: 14 % — ABNORMAL HIGH (ref 0–4)
LYMPHOCYTES ABSOLUTE COUNT: 0.1 10*9/L — ABNORMAL LOW (ref 1.5–5.0)
LYMPHOCYTES RELATIVE PERCENT: 25.9 %
MEAN CORPUSCULAR HEMOGLOBIN: 29.5 pg (ref 26.0–34.0)
MEAN CORPUSCULAR VOLUME: 82.2 fL (ref 80.0–100.0)
MEAN PLATELET VOLUME: 8.4 fL (ref 7.0–10.0)
MONOCYTES ABSOLUTE COUNT: 0.1 10*9/L — ABNORMAL LOW (ref 0.2–0.8)
MONOCYTES RELATIVE PERCENT: 22.2 %
NEUTROPHILS ABSOLUTE COUNT: 0.2 10*9/L — CL (ref 2.0–7.5)
NEUTROPHILS RELATIVE PERCENT: 36.7 %
PLATELET COUNT: 9 10*9/L — CL (ref 150–440)
RED BLOOD CELL COUNT: 3.08 10*12/L — ABNORMAL LOW (ref 4.00–5.20)
RED CELL DISTRIBUTION WIDTH: 15.1 % — ABNORMAL HIGH (ref 12.0–15.0)

## 2020-04-27 LAB — MICROCYTES

## 2020-04-27 LAB — SMEAR REVIEW

## 2020-04-27 LAB — TACROLIMUS, TROUGH: Lab: 3.5 — ABNORMAL LOW

## 2020-04-27 LAB — MAGNESIUM: Magnesium:MCnc:Pt:Ser/Plas:Qn:: 1.7

## 2020-04-27 LAB — PLATELET COUNT: Platelets:NCnc:Pt:Bld:Qn:Automated count: 31 — ABNORMAL LOW

## 2020-04-27 LAB — POTASSIUM: Potassium:SCnc:Pt:Ser/Plas:Qn:: 4

## 2020-04-27 MED ORDER — TACROLIMUS 0.5 MG CAPSULE, IMMEDIATE-RELEASE
ORAL_CAPSULE | ORAL | 5 refills | 30 days | Status: CP
Start: 2020-04-27 — End: ?

## 2020-04-27 MED ADMIN — albuterol 2.5 mg /3 mL (0.083 %) nebulizer solution 2.5 mg: 2.5 mg | RESPIRATORY_TRACT | @ 19:00:00

## 2020-04-27 MED ADMIN — pentamidine (PENTAM) inhalation solution: 300 mg | RESPIRATORY_TRACT | @ 19:00:00 | Stop: 2020-04-27

## 2020-04-27 MED ADMIN — heparin, porcine (PF) 100 unit/mL injection 200 Units: 200 [IU] | INTRAVENOUS | @ 15:00:00 | Stop: 2020-04-27

## 2020-04-27 MED FILL — POSACONAZOLE 100 MG TABLET,DELAYED RELEASE: 30 days supply | Qty: 90 | Fill #0 | Status: AC

## 2020-04-27 MED FILL — CEFDINIR 300 MG CAPSULE: 5 days supply | Qty: 10 | Fill #3 | Status: AC

## 2020-04-27 MED FILL — CEFDINIR 300 MG CAPSULE: ORAL | 5 days supply | Qty: 10 | Fill #3

## 2020-04-27 NOTE — Unmapped (Signed)
Patient received one unit of platlets according to therapy plan protocol. She had Albuterol and Pentamidine inhalants according to therapy plan protocol. Her post platelet count rose by more than 15. The proximal lumen of her CVC was flushed and saline locked. She declined an AVS and was discharged.

## 2020-04-27 NOTE — Unmapped (Signed)
Bone Marrow Transplant and Cellular Therapy Program  Immunosuppressive Therapy Note    Susan Kane is a 59 y.o. 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 Ms. Beckett is currently day +104.    Current dose: tacrolimus 1 mg in AM and 1.5 mg in PM    Goal tacrolimus Level: 5-10 ng/mL    Resulted level: 3.5 ng/mL     Lab Results   Component Value Date/Time    TACROLIMUS 3.5 (L) 04/27/2020 10:35 AM    TACROLIMUS 4.6 (L) 04/23/2020 11:27 AM    TACROLIMUS 5.3 04/21/2020 10:58 AM    TACROLIMUS 5.5 04/19/2020 10:11 AM     Lab Results   Component Value Date/Time    CREATININE 1.17 (H) 04/27/2020 10:35 AM    CREATININE 1.15 (H) 04/25/2020 09:10 AM    CREATININE 1.19 (H) 04/23/2020 11:27 AM    CREATININE 1.27 (H) 04/21/2020 11:08 AM      Assessment/Recommendation: Tacrolimus level is subtherapeutic and Scr is slightly better but overall stable. LFTs/Tbili remain WNL. Her donor percentages are improving and she needs to be above 5 per Dr Oswald Hillock. Given the above, will plan to increase tacrolimus to 1.5 mg PO qAM and 2 mg qPM and will recheck at next clinic visit to further assess trend and need for adjustment.      We will continue to monitor levels.  Patient will be followed for changes in renal and hepatic function, toxicity, and efficacy.     Rulon Abide, PharmD, BCOP  Clinical Pharmacist Practitioner, BMT

## 2020-04-28 ENCOUNTER — Telehealth: Payer: Self-pay | Admitting: Psychiatry

## 2020-04-28 LAB — CMV DNA, QUANTITATIVE, PCR

## 2020-04-28 LAB — SENDOUT TEST RESULT: Lab: 0

## 2020-04-28 LAB — CMV COMMENT: Lab: 0

## 2020-04-28 NOTE — Unmapped (Signed)
Addended by: Allyne Gee on: 04/28/2020 01:08 PM     Modules accepted: Orders

## 2020-04-28 NOTE — Unmapped (Signed)
Clinical Assessment Needed For: Dose Change  Medication: tacrolimus  Last Fill Date/Day Supply: 04/13/20 / 30  Refill Too Soon until 10/12  Was previous dose already scheduled to fill: No    Notes to Pharmacist: None

## 2020-04-28 NOTE — Telephone Encounter (Signed)
Please advise 

## 2020-04-28 NOTE — Telephone Encounter (Signed)
The med was mirtazapine 30 mg 1 qhs.  Send in with 2 RF please

## 2020-04-28 NOTE — Telephone Encounter (Signed)
Patient called and said she is having low weight again. She is wondering if she should start the medicine that she was given to help her gain weight?  [phone]

## 2020-04-29 ENCOUNTER — Telehealth: Payer: Self-pay | Admitting: Psychiatry

## 2020-04-29 ENCOUNTER — Other Ambulatory Visit: Payer: Self-pay

## 2020-04-29 MED ORDER — MIRTAZAPINE 30 MG PO TABS: 30.0000 mg | ORAL_TABLET | Freq: Every day | ORAL | 2 refills | Status: DC

## 2020-04-29 MED ORDER — ONDANSETRON HCL 8 MG TABLET
ORAL_TABLET | Freq: Two times a day (BID) | ORAL | 2 refills | 15.00000 days | Status: CP | PRN
Start: 2020-04-29 — End: 2021-04-29
  Filled 2020-04-30: qty 30, 15d supply, fill #0

## 2020-04-29 NOTE — Telephone Encounter (Signed)
Patient aware and I will submit Rx to [facility] and [employer]

## 2020-04-30 ENCOUNTER — Encounter: Admit: 2020-04-30 | Discharge: 2020-05-01 | Payer: PRIVATE HEALTH INSURANCE

## 2020-04-30 ENCOUNTER — Ambulatory Visit: Admit: 2020-04-30 | Discharge: 2020-05-01 | Payer: PRIVATE HEALTH INSURANCE

## 2020-04-30 DIAGNOSIS — Z9484 Stem cells transplant status: Secondary | ICD-10-CM | POA: Diagnosis not present

## 2020-04-30 LAB — COMPREHENSIVE METABOLIC PANEL
ALBUMIN: 4 g/dL (ref 3.4–5.0)
ALKALINE PHOSPHATASE: 111 U/L (ref 46–116)
ALT (SGPT): 70 U/L — ABNORMAL HIGH (ref 10–49)
ANION GAP: 8 mmol/L (ref 5–14)
AST (SGOT): 33 U/L (ref <=34)
BILIRUBIN TOTAL: 0.7 mg/dL (ref 0.3–1.2)
BLOOD UREA NITROGEN: 20 mg/dL (ref 9–23)
BUN / CREAT RATIO: 15
CALCIUM: 9.2 mg/dL (ref 8.7–10.4)
CHLORIDE: 107 mmol/L (ref 98–107)
CO2: 24 mmol/L (ref 20.0–31.0)
CREATININE: 1.3 mg/dL — ABNORMAL HIGH
EGFR CKD-EPI AA FEMALE: 52 mL/min/{1.73_m2} — ABNORMAL LOW (ref >=60)
EGFR CKD-EPI NON-AA FEMALE: 45 mL/min/{1.73_m2} — ABNORMAL LOW (ref >=60)
GLUCOSE RANDOM: 213 mg/dL — ABNORMAL HIGH (ref 70–179)
POTASSIUM: 3.5 mmol/L (ref 3.4–4.5)
SODIUM: 139 mmol/L (ref 135–145)

## 2020-04-30 LAB — CBC W/ AUTO DIFF
BASOPHILS RELATIVE PERCENT: 0 %
EOSINOPHILS ABSOLUTE COUNT: 0 10*9/L (ref 0.0–0.4)
HEMATOCRIT: 22.6 % — ABNORMAL LOW (ref 36.0–46.0)
HEMOGLOBIN: 8.2 g/dL — ABNORMAL LOW (ref 12.0–16.0)
LARGE UNSTAINED CELLS: 8 % — ABNORMAL HIGH (ref 0–4)
LYMPHOCYTES ABSOLUTE COUNT: 0.2 10*9/L — ABNORMAL LOW (ref 1.5–5.0)
LYMPHOCYTES RELATIVE PERCENT: 33.6 %
MEAN CORPUSCULAR HEMOGLOBIN: 29.8 pg (ref 26.0–34.0)
MEAN CORPUSCULAR VOLUME: 81.8 fL (ref 80.0–100.0)
MEAN PLATELET VOLUME: 11.3 fL — ABNORMAL HIGH (ref 7.0–10.0)
MONOCYTES ABSOLUTE COUNT: 0.1 10*9/L — ABNORMAL LOW (ref 0.2–0.8)
MONOCYTES RELATIVE PERCENT: 21.1 %
NEUTROPHILS ABSOLUTE COUNT: 0.2 10*9/L — CL (ref 2.0–7.5)
NEUTROPHILS RELATIVE PERCENT: 35.8 %
PLATELET COUNT: 20 10*9/L — ABNORMAL LOW (ref 150–440)
RED BLOOD CELL COUNT: 2.77 10*12/L — ABNORMAL LOW (ref 4.00–5.20)
RED CELL DISTRIBUTION WIDTH: 15 % (ref 12.0–15.0)
WBC ADJUSTED: 0.5 10*9/L — ABNORMAL LOW (ref 4.5–11.0)

## 2020-04-30 LAB — BUN / CREAT RATIO: Urea nitrogen/Creatinine:MRto:Pt:Ser/Plas:Qn:: 15

## 2020-04-30 LAB — LYMPHOCYTES RELATIVE PERCENT: Lymphocytes/100 leukocytes:NFr:Pt:Bld:Qn:Automated count: 33.6

## 2020-04-30 LAB — MAGNESIUM: Magnesium:MCnc:Pt:Ser/Plas:Qn:: 1.6

## 2020-04-30 LAB — EBV VIRAL LOAD RESULT: Lab: NOT DETECTED

## 2020-04-30 LAB — TACROLIMUS, TROUGH: Lab: 4.9 — ABNORMAL LOW

## 2020-04-30 MED ORDER — ZOLPIDEM 5 MG TABLET
ORAL_TABLET | ORAL | 3 refills | 0.00000 days | Status: CP
Start: 2020-04-30 — End: ?
  Filled 2020-05-03: qty 60, 30d supply, fill #0

## 2020-04-30 MED FILL — ONDANSETRON HCL 8 MG TABLET: 15 days supply | Qty: 30 | Fill #0 | Status: AC

## 2020-04-30 NOTE — Telephone Encounter (Signed)
Error

## 2020-05-03 ENCOUNTER — Encounter: Admit: 2020-05-03 | Discharge: 2020-05-03 | Payer: PRIVATE HEALTH INSURANCE

## 2020-05-03 DIAGNOSIS — Z9484 Stem cells transplant status: Secondary | ICD-10-CM | POA: Diagnosis not present

## 2020-05-03 DIAGNOSIS — E038 Other specified hypothyroidism: Secondary | ICD-10-CM | POA: Diagnosis not present

## 2020-05-03 DIAGNOSIS — C9111 Chronic lymphocytic leukemia of B-cell type in remission: Secondary | ICD-10-CM | POA: Diagnosis not present

## 2020-05-03 DIAGNOSIS — D849 Immunodeficiency, unspecified: Secondary | ICD-10-CM | POA: Diagnosis not present

## 2020-05-03 DIAGNOSIS — D709 Neutropenia, unspecified: Secondary | ICD-10-CM | POA: Diagnosis not present

## 2020-05-03 DIAGNOSIS — D619 Aplastic anemia, unspecified: Secondary | ICD-10-CM | POA: Diagnosis not present

## 2020-05-03 DIAGNOSIS — Z792 Long term (current) use of antibiotics: Secondary | ICD-10-CM | POA: Diagnosis not present

## 2020-05-03 DIAGNOSIS — Z79890 Hormone replacement therapy (postmenopausal): Secondary | ICD-10-CM | POA: Diagnosis not present

## 2020-05-03 DIAGNOSIS — C9110 Chronic lymphocytic leukemia of B-cell type not having achieved remission: Principal | ICD-10-CM

## 2020-05-03 LAB — ALT (SGPT): Alanine aminotransferase:CCnc:Pt:Ser/Plas:Qn:: 75 — ABNORMAL HIGH

## 2020-05-03 LAB — CBC W/ AUTO DIFF
BASOPHILS ABSOLUTE COUNT: 0 10*9/L (ref 0.0–0.1)
EOSINOPHILS ABSOLUTE COUNT: 0 10*9/L (ref 0.0–0.4)
EOSINOPHILS RELATIVE PERCENT: 1 %
HEMATOCRIT: 20.5 % — ABNORMAL LOW (ref 36.0–46.0)
HEMOGLOBIN: 7.8 g/dL — ABNORMAL LOW (ref 12.0–16.0)
LARGE UNSTAINED CELLS: 8 % — ABNORMAL HIGH (ref 0–4)
LYMPHOCYTES ABSOLUTE COUNT: 0.3 10*9/L — ABNORMAL LOW (ref 1.5–5.0)
LYMPHOCYTES RELATIVE PERCENT: 38.6 %
MEAN CORPUSCULAR HEMOGLOBIN CONC: 38 g/dL — ABNORMAL HIGH (ref 31.0–37.0)
MEAN CORPUSCULAR HEMOGLOBIN: 30.7 pg (ref 26.0–34.0)
MEAN CORPUSCULAR VOLUME: 80.7 fL (ref 80.0–100.0)
MEAN PLATELET VOLUME: 10.1 fL — ABNORMAL HIGH (ref 7.0–10.0)
MONOCYTES ABSOLUTE COUNT: 0.1 10*9/L — ABNORMAL LOW (ref 0.2–0.8)
MONOCYTES RELATIVE PERCENT: 18.4 %
NEUTROPHILS ABSOLUTE COUNT: 0.2 10*9/L — CL (ref 2.0–7.5)
NEUTROPHILS RELATIVE PERCENT: 34 %
PLATELET COUNT: 21 10*9/L — ABNORMAL LOW (ref 150–440)
RED BLOOD CELL COUNT: 2.54 10*12/L — ABNORMAL LOW (ref 4.00–5.20)
RED CELL DISTRIBUTION WIDTH: 15.4 % — ABNORMAL HIGH (ref 12.0–15.0)

## 2020-05-03 LAB — COMPREHENSIVE METABOLIC PANEL
ALBUMIN: 4.2 g/dL (ref 3.4–5.0)
ALKALINE PHOSPHATASE: 120 U/L — ABNORMAL HIGH (ref 46–116)
ANION GAP: 10 mmol/L (ref 5–14)
AST (SGOT): 40 U/L — ABNORMAL HIGH (ref <=34)
BILIRUBIN TOTAL: 0.9 mg/dL (ref 0.3–1.2)
BLOOD UREA NITROGEN: 24 mg/dL — ABNORMAL HIGH (ref 9–23)
BUN / CREAT RATIO: 15
CALCIUM: 9.5 mg/dL (ref 8.7–10.4)
CHLORIDE: 109 mmol/L — ABNORMAL HIGH (ref 98–107)
CO2: 22 mmol/L (ref 20.0–31.0)
CREATININE: 1.57 mg/dL — ABNORMAL HIGH
EGFR CKD-EPI AA FEMALE: 41 mL/min/{1.73_m2} — ABNORMAL LOW (ref >=60)
EGFR CKD-EPI NON-AA FEMALE: 36 mL/min/{1.73_m2} — ABNORMAL LOW (ref >=60)
POTASSIUM: 3.5 mmol/L (ref 3.4–4.5)
PROTEIN TOTAL: 6.5 g/dL (ref 5.7–8.2)
SODIUM: 141 mmol/L (ref 135–145)

## 2020-05-03 LAB — THYROID STIMULATING HORMONE: Thyrotropin:ACnc:Pt:Ser/Plas:Qn:: 0.93

## 2020-05-03 LAB — LYMPHOCYTES ABSOLUTE COUNT: Lymphocytes:NCnc:Pt:Bld:Qn:Automated count: 0.3 — ABNORMAL LOW

## 2020-05-03 LAB — MAGNESIUM: Magnesium:MCnc:Pt:Ser/Plas:Qn:: 1.7

## 2020-05-03 LAB — FREE T4: Thyroxine.free:MCnc:Pt:Ser/Plas:Qn:: 1.12

## 2020-05-03 LAB — SMEAR REVIEW

## 2020-05-03 MED ADMIN — heparin, porcine (PF) 100 unit/mL injection 200 Units: 200 [IU] | INTRAVENOUS | @ 16:00:00 | Stop: 2020-05-03

## 2020-05-03 MED FILL — ZOLPIDEM 5 MG TABLET: 30 days supply | Qty: 60 | Fill #0 | Status: AC

## 2020-05-03 MED FILL — CEFDINIR 300 MG CAPSULE: 30 days supply | Qty: 60 | Fill #4 | Status: AC

## 2020-05-03 MED FILL — CEFDINIR 300 MG CAPSULE: ORAL | 30 days supply | Qty: 60 | Fill #4

## 2020-05-03 NOTE — Unmapped (Signed)
BMT-CT Clinic Follow-up    Referring Physician: Jillyn Hidden Nida Boatman) [name], MD   Primary Care Provider: Thora Lance, MD  BMT Attending MD: Lanae Boast, MD    Disease: CLL  Current disease status: SD (stable disease)  Type of Transplant: RIC MUD Allo  Graft Source: Cryopreserved PBSCs  Transplant Day: +110    Study Participant: BMT CTN 1704: Composite Health Assessment Risk Model for Older Adults: Applying Pre-transplant Comorbidity, Geriatric Assessment and Biomarkers to Predict Non-Relapse Mortality After Allogeneic Transplant (CHARM).  ??  HPI:  Susan Kane??is a 59 y.o.??woman??with a diagnosis of CLL??and aplastic anemia who is now s/p RIC MUD AlloSCT. Her transplant course was complicated by delayed engraftment. ??She was initially discharged from the hospital on 02/23/2020. However was re-admitted on 02/27/20 with a fever and need for workup and IV cefepime. Infectious workup was unrevealing, IV cefepime was stopped on 03/02/20, and she was once again discharged on 03/03/20.  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  Currently planning for second transplant.      Interval History: Susan Kane presents with her husband for routine clinic follow up today.  She did not have visits this weekend.  She states that she has felt well. They celebrated their anniversary with dinner outside this weekend.    She has been eating a little better. She has not had any NVD. Denies pain.  She started Remeron and does have increased desire to eat.  She has not had any rash. Skin is dry.  She has a BM bx planned for later this week. Clinically she is doing well.  She does not really need clinical management at this time just monitoring of labs.       Oncology History Overview Note   CLL:    Summary as per Dr. Kalman Drape most recent note with minor edits/additions from my review of records    1.??CLL?????diagnosed in August 2010, flow cytometry consistent with CLL  ?? Enlarged left??inguinal lymph node January 2019,??small??neck/axillary nodes and palpable splenomegaly 09/11/2017  ?? CTs??on 09/17/2017-3 cm necrotic appearing lymph node in the left inguinal region, borderline enlarged pelvic/retroperitoneal, chest, and axillary nodes. ??Mild splenomegaly.  ?? Ultrasound-guided biopsy of the left inguinal lymph node 09/18/2017, slightly purulent fluid aspirated, core biopsy is consistent with an atypical lymphoid proliferation???extensive necrosis with surrounding epithelioid histiocytes, limited intact lymphoid tissue involved with CLL  ?? Incisional biopsy of a necrotic/purulent left inguinal lymph node on 10/01/2017???extensive necrosis with granulomatous inflammation, small amount of viable lymphoid tissue involved with CLL, AFB and fungal stains negative  ?? Peripheral blood FISH analysis 02/05/2018??? +deletion 13q14, no evidence of p53 (17p13) deletion, no evidence of 11q22??deletion  - normal karyotype (46,XX)  ?? Bone marrow biopsy 02/26/2018???hypercellular marrow with extensive involvement by CLL, lymphocytes represent??85% of all cells  ?? Ibrutinib initiated 04/03/2018  ?? Ibrutinib placed on hold 04/11/2018 due to onset of arthralgias  ?? Ibrutinib resumed 04/16/2018, discontinued 04/25/2018 secondary to severe arthralgias/arthritis  ?? Ibrutinib resumed at a dose of 140 mg daily 05/03/2018  ?? Ibrutinib dose adjusted to 140 mg alternating with 280 mg 06/25/2018  ?? 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 ??Augmentin prescribed.  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 DAT negative, bilirubin and LDH normal June 2019, progressive symptomatic anemia 04/01/2018, red cell  transfusions 04/01/2018,??04/30/2018,??05/28/2018, 06/17/2018, and 07/05/2018  7.????Hypogammaglobulinemia    Baseline BM bx reviewed at [facility]  Final Diagnosis   (Outside Case #:  [id number], dated 02/26/2018)  Bone marrow, aspiration and biopsy  -  Hypercellular bone marrow (80%) with extensive involvement by chronic lymphocytic leukemia (87% lymphocytes by manual aspirate differential)  (See Comment)  -  By outside report, cytogenetic results are normal     Karyotype: 43, XX and FISH with 13q del but no 11q or 17p del    Repeat BM bx: (done after being off ibrutinib x1 month)  80% involvement by CLL    08/13/18: Presents to [facility] to discuss plan of care     Chronic lymphocytic leukemia (CLL), B-cell (CMS-HCC)   07/12/2018 Initial Diagnosis    Chronic lymphocytic leukemia (CLL), B-cell (CMS-HCC)     08/26/2018 -  Chemotherapy    Acalabrutinib started (approximate date)     11/14/2018 -  Chemotherapy    Acalabrutinib discontinued due to progressive bone marrow failure     12/06/2018 -  Chemotherapy    Ritux x1 given due to CLL still making up majority of BM cellularity (though in the setting of near aplasia of rest of TLH)     03/19/2019 -  Chemotherapy    Equine ATG / CsA for aplastic anemia (suspect 2/t acalabrutinib)     Chronic lymphocytic leukemia of B-cell type not having achieved remission (CMS-HCC)   01/21/2019 Initial Diagnosis    Chronic lymphocytic leukemia of B-cell type not having achieved remission (CMS-HCC)     01/01/2020 -  Chemotherapy    BMT IP BENDAMUSTINE / FLUDARABINE / RITUXIMAB (OP RITUXIMAB Days -13 & -6) (MUD)  bendamustine 130 mg/m2 IV on days -5, -4, -3   fludarabine 30 mg/m2 IV on days -5, -4, -3   riTUXimab 375 mg/m2 IV on day -13 as a OP treatment day, then 1,000 mg/m2 on days -6 as a OP treatment day, +1, +8   tacrolimus, 0.045 mg/kg PO BID on days -3 and -2 then  0.03 mg/kg PO BID starting on day -1   methotrexate 5 mg/m2 IV on days +1, +3, +6, +11   tbo-filgrastim 300 mcg (<= 75 kg) / 480 mcg (> 75 kg) q24h starting on day +7        Patient Active Problem List   Diagnosis   ??? Chronic lymphocytic leukemia (CLL), B-cell (CMS-HCC)   ??? Hypogammaglobulinemia (CMS-HCC)   ??? Chronic lymphocytic leukemia of B-cell type not having achieved remission (CMS-HCC)   ??? Aplastic anemia (CMS-HCC)   ??? 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 Drug-induced nausea and vomiting   ??? Stem cell transplant candidate   ??? Chemotherapy induced neutropenia (CMS-HCC)   ??? History of allogeneic stem cell transplant (CMS-HCC)   ??? Osteopenia   ??? Neutropenia, drug-induced (CMS-HCC)   ??? Immunosuppressed status (CMS-HCC)   ??? Bone marrow hypocellularity (CMS-HCC)   ??? Neutropenia (CMS-HCC)     Review of Systems:  A full system review was performed and was negative except as noted in the above interval history.    Reviewed and updated past medical, surgical, social, and family history as appropriate.      No Known Allergies  Current Outpatient Medications   Medication Sig Dispense Refill   ??? calcium carbonate-vitamin D3 600 mg(1,500mg ) -800 unit Tab Take 1 tablet by mouth Two (2) times a day. (Patient not taking: Reported on 03/05/2020) 60 tablet 5   ??? cefdinir (OMNICEF) 300 MG  capsule Take 1 capsule (300 mg total) by mouth Two (2) times a day. 60 capsule 1   ??? famotidine (PEPCID) 20 MG tablet Take 1 tablet (20 mg total) by mouth two (2) times a day as needed for heartburn. 60 tablet 0   ??? folic acid (FOLVITE) 1 MG tablet Take 1 tablet (1 mg total) by mouth at bedtime. 30 tablet 5   ??? levothyroxine (SYNTHROID) 100 MCG tablet Take 1 tablet (100 mcg total) by mouth daily. 30 tablet 5   ??? lithium (LITHOBID) 300 MG ER tablet Take 1 tablet (300 mg total) by mouth at bedtime. 30 tablet 5   ??? magnesium oxide-Mg AA chelate (MAGNESIUM, AMINO ACID CHELATE,) 133 mg Tab Take 1 tablet by mouth daily. 100 tablet 2   ??? ondansetron (ZOFRAN) 8 MG tablet Take 1 tablet (8 mg total) by mouth every twelve (12) hours as needed for nausea. 30 tablet 2   ??? polyethylene glycol (MIRALAX) 17 gram packet Take 17 g by mouth daily as needed (constipation).     ??? posaconazole (NOXAFIL) 100 mg TbEC delayed released tablet Take 3 tablets (300 mg total) by mouth daily. 90 tablet 1   ??? tacrolimus (PROGRAF) 0.5 MG capsule Take 3 capsules (1.5 mg total) by mouth daily AND 4 capsules (2 mg total) nightly. 210 capsule 5   ??? TRINTELLIX 10 mg tablet Take 1 tablet (10 mg total) by mouth at bedtime. 30 tablet 5   ??? valACYclovir (VALTREX) 500 MG tablet Take 1 tablet (500 mg total) by mouth daily. 30 tablet 11   ??? zolpidem (AMBIEN) 5 MG tablet Take 1 to 2 tablets by mouth once nightly as needed for insomnia. 60 tablet 3     No current facility-administered medications for this visit.     Objective:  Vitals:    05/03/20 1159   BP: 129/73   Pulse: 96   Resp: 18   Temp: 36.1 ??C (96.9 ??F)   TempSrc: Temporal   SpO2: 100%   Weight: 52.9 kg (116 lb 11.2 oz)   Height: 166.4 cm (5' 5.51)     Physical Exam: essentially unchanged from previously documented physical exam  General appearance - Thin WF, no distress, bronze coloring.    Mental status - affect appropriate to situation   Eyes - conjunctivae clear, sclera anicteric   ENT - MMM, no oral ulcerations or thrush  Neck - supple, no palpable LAD  Chest - lungs are clear to auscultation bilaterally without wheezes or crackles  Heart - regular rate and rhythm without murmur, rub, or gallop   Abdomen - soft, bowel sounds within normal limits, no masses, no tenderness, no hepatosplenomegaly   Neurological - motor and sensory grossly normal bilaterally  Extremities - no pedal edema noted  Skin - no skin breakdown noted, no rash, petechiae noted on upper chest   Venous access - line site is non-tender, no erythema/drainage    Karnofsky/Lansky Performance Status  80, Normal activity with effort; some signs or symptoms of disease (ECOG equivalent 1)    Test Results:   Reviewed in EPIC. Abnormal values discussed below.     Assessment/Plan:  Susan Kane is 59 yo woman with CLL as well as AA/bone marrow failure.  ??  CLL: CT C/A/P on 10/10/19 revealed no evidence of LAD or organomegaly. Possible endometrial fluid or thickening in the fundus of the uterus.  10/07/19 BMBx: Scant bone marrow sampling (<5% cellular) essentially devoid of hematopoietic elements.  - ??Persistent CLL representing  23% of marrow cellularity by flow cytometric analysis.  -  Corresponding CBC: WBC 1.0, ANC 0.1.  ????????  ??  12/26/19: BmBx:   - ??Hypocellular (20% overall) involved by chronic lymphocytic leukemia/small lymphocytic lymphoma, representing approximately 20% of marrow cells by PAX5 immunohistochemistry.  - Normal karyotype: 46,XX[20]    02/10/20: BmBx:  -Markedly hypocellular bone marrow (< 5%) with essentially absent hematopoiesis. Normal Recipient and Donor karyotype:  46,XX[2]//46,XY[7].     Repeat bone marrow biopsy done 9/15 and is variably cellular with no evidence of CLL. Plan to repeat on today.    BMT:??SD (stable disease)  HCT-CI (age adjusted) 5??(active CLL, psych, age adjusted)    Conditioning from 1st transplant:  1. Rituximab 375 mg/m2 on day -13 and 1000 mg/m2 on D-6, +1, and +8  2. Bendamustine 130 mg/m2 IV daily over 10 minutes on D-5 through -3  3. Fludarabine 30 mg/m2 IV daily over 30 minutes on D-5 through -3  ??  Donor: 10/10, ABO identical; O - , CMV negative  Cell dose: 5.51x10^6 cells/kg    Engraftment:   - Granix D+12 through WBC recovery (ANC 1.0 x 2 days or 3.0 x 1 day)  - Viral studies showed weakly positive EBV, CMV, HHV-6, plan to monitor for now  - 02/10/20 D+30 BMBx: markedly hypocellular marrow (<5%) with essentially absent hematopoiesis.     Chimerisms:     02/09/2020 00:23 02/10/2020 14:45 03/08/2020 13:49 03/08/2020 13:49 03/15/2020 09:38 03/15/2020 09:38 04/07/2020 10:30 04/07/2020 10:30 04/07/2020 12:45 04/07/2020 12:45   Specimen Type Blood Bone Marrow Blood Blood Blood Blood Blood Blood Bone Marrow Bone Marrow   T-Cell (CD3) Recipient:    >95  >95  >95  >95   T-Cell (CD3) Donor:    <5  <5  <5  <5   Unfract. Recipient: 37 17 14  15  28  10     Unfract. Donor: 63 83 86  85  72  90 GvHD prophylaxis:   1. Tacrolimus goal 5-10, dose adjustments per BMT pharmacy.   ??** Current dose 1 mg in the am and 1.5mg  qpm  2. Completed Methotrexate 5 mg/m2 on D+1, +3, +6 and +11  3. rATG 1 mg/kg on D-2 and -1 included  ??  Heme:  Transfusion criteria: ABO identical donor [O-]   - Standard txf criteria: 1 unit of PRBCs for Hgb<7 and 1 unit of platelets for Plt <10K or bleeding.     ** No history of transfusion reactions.   - Granix for ANC of 1 or less [tried Udenyca but did not get a lasting result]    ** Given 8/27, 8/30, 9/3, 9/8, 9/10, 04/05/20, 9/17 (holding off as she has not had recent response)  - Has been requiring PRBCs and platelets weekly at least.  - . Stopped promacta 9/22/21per Dr. Oswald Hillock. BM bx next week.    No transfusions today :)       ID:????  Prophylaxis:  - Antiviral: Valtrex 500mg  daily.   - Antifungal: Posaconazole 300 mg daily  - Antibacterial:??Cefdinir prophy [04/05/20]     - PJP: Montly pentamidine [7/31, 03/31/20].    CMV:   03/02/20: CMV 884. Valganciclovir started.    03/08/20: CMV decreased after 1 week of therapy to 140.  03/17/20: Last CMV <50. Decreased valcyte to 450 mg BID.   ** Letermovir denied. Appeal in progress.    03/22/20: CMV not detected. Maintenance dosing ends on 9/8  03/31/20: D/C Valcyte. CMV negative.   ??  HHV-6:   -  Peaked on PCR at 41.2, now negative.   ??  EBV:   - Weekly, negative 03/31/20   ??  GI:??  -Decreased appetite.     ** Weight is stable.   ??  Loose stools:  -9/22: with cramping. Will send CDiff if able to produce a sample in clinic today.  -9/24: diarrhea better with no intervention    Nausea:  - Zofran 8mg  prn [taking bid]  ??  Renal:   05/03/20: SCr 1.5.  She was released before this resulted today.  Her Cr had been good.  Will give fluids if needed Wednesday.  ??  FEN:   - Continue folic acid.    Hypomagnesemia:  - Mag Chelate 1 tablet daily.   ??  Endocrine:  Hypothyroid:??  - Continue Synthroid daily.   - TSH,  T4 - normal on 10/11  ??  Hepatic:??  - VOD prophylaxis not warranted as non-myeloablative regimen.   ??  Hepatocellular liver injury pattern:  - Mild increase in ALT, and AlkPhos, likely secondary to posaconazole.     ** Overall stable.   ??  Neuro/Pain:??  - No active issues.    Bone Health:   03/05/20: DEXA scan results:  - spine L1 - L4 T score -1.4  - L proximal femur T score -1.9  - L femoral neck T score -1.8  Based on DEXA scan, patient has osteopenia of spine, femur and femoral neck. Recommend administering zoledronic acid 4 mg IV every 3 months x 2 doses in addition to calcium and vitamin D supplementation 600 mg/ 800 units twice daily.   - Zometa given [8/18], final dose due in November.  ??  Psych:??Has a home psychiatrist. Alanson Aly [phone].   Depression/Anxiety:   - Per outpt note pt was taking Xanax 0.25 mg BID PRN for anxiety.  - Trintellix 10mg  nightly  - Lithium 300mg  nightly   04/02/20: Requested lithium level [last checked in June]     ** Dr. Jennelle Human was updated with results [04/05/20].    ??  Insomnia:  - Ambien 10mg  nightly PRN  ??  Caregiver/Lodging:  - Spouse [name] is her primary caregiver  - Staying at local rental.     Summary:  - Pancytopenia with neutropenia.   - BM on Wednesday.   Provider visit on Friday.  She only needs provider visits once per week.    - Next week will meet with Dr. Oswald Hillock on Wednesday to discuss plan after bx results come in.    - Her 9/29 DNA fingerprinting showed improved T Cell to 17 % from 0.  Donor in UF is up tp 90%.  - Repeat INR 1.4. We have done 2 rounds of vit k with no change.  She is not having bleeding so we will just monitor for now.   - 2nd transplant delayed till the [facility] results are back from the biopsy that will be done Wednesday.  Dr. Oswald Hillock would like to have 2 cores with touch preps.  Flow, cytogenetics, and DNA and FISH.      Dimple Nanas, ANP  Bone Marrow Transplant and Cellular Therapy Program    Future Appointments   Date Time Provider Department Center 05/03/2020  1:00 PM ONCINF CHAIR 06 [facility]   05/05/2020 11:30 AM ONCBMT LABS [facility]   05/05/2020 12:30 PM ONCBMT PROCEDURES [id number]   05/07/2020  9:00 AM ONCBMT LABS [facility]   05/07/2020  9:15 AM ONCBMT APP  B [facility]   05/07/2020 10:30 AM ONCINF CHAIR 07 [facility]   06/08/2020 11:00 AM ONCINF CHAIR 04 [facility]     I personally spent 70 minutes face-to-face and non-face-to-face in the care of this patient, which includes all pre, intra, and post visit time on the date of service.

## 2020-05-04 ENCOUNTER — Other Ambulatory Visit: Payer: Self-pay | Admitting: Psychiatry

## 2020-05-04 DIAGNOSIS — D619 Aplastic anemia, unspecified: Principal | ICD-10-CM

## 2020-05-04 LAB — TACROLIMUS, TROUGH: Lab: 6.8

## 2020-05-04 MED ORDER — TACROLIMUS 0.5 MG CAPSULE, IMMEDIATE-RELEASE
ORAL_CAPSULE | ORAL | 5 refills | 30 days | Status: CP
Start: 2020-05-04 — End: ?

## 2020-05-04 NOTE — Unmapped (Signed)
Bone Marrow Transplant and Cellular Therapy Program  Immunosuppressive Therapy Note    Susan Kane is a 59 y.o. 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 Susan Kane is currently day +111.    Current dose: tacrolimus 1.5 mg in AM and 2 mg in PM (dose increased on 10/5)    Goal tacrolimus Level: 5-10 ng/mL    Resulted level: 6.8 ng/mL (targeting closer to 5 given mixed chimera)    Lab Results   Component Value Date/Time    TACROLIMUS 6.8 05/03/2020 11:52 AM    TACROLIMUS 4.9 (L) 04/30/2020 10:57 AM    TACROLIMUS 3.5 (L) 04/27/2020 10:35 AM    TACROLIMUS 4.6 (L) 04/23/2020 11:27 AM     Lab Results   Component Value Date/Time    CREATININE 1.57 (H) 05/03/2020 11:52 AM    CREATININE 1.30 (H) 04/30/2020 10:57 AM    CREATININE 1.17 (H) 04/27/2020 10:35 AM    CREATININE 1.15 (H) 04/25/2020 09:10 AM      Assessment/Recommendation: Tacrolimus level is therapeutic today however her Scr is up today. LFTs/Tbili remain WNL. Her donor percentages are improving and she needs to be above 5 per Dr Oswald Hillock but still in the lower end of target range. Given the above, will plan to decrease tacrolimus to 1.5 mg PO qAM and 1 mg qPM and will recheck at next clinic visit to further assess trend and need for adjustment.  Left voicemail with new dosing instructions and asked patient to call me back if she had any questions.     We will continue to monitor levels.  Patient will be followed for changes in renal and hepatic function, toxicity, and efficacy.     Rulon Abide, PharmD, BCOP  Clinical Pharmacist Practitioner, BMT

## 2020-05-04 NOTE — Unmapped (Signed)
Clinical Assessment Needed For: Dose Change  Medication: Tacrolimus 0.5 mg capsule  Last Fill Date/Day Supply: 04/13/20 / 30 days  Copay $10  Was previous dose already scheduled to fill: No    Notes to Pharmacist:

## 2020-05-05 ENCOUNTER — Encounter: Admit: 2020-05-05 | Discharge: 2020-05-06 | Payer: PRIVATE HEALTH INSURANCE

## 2020-05-05 DIAGNOSIS — C9110 Chronic lymphocytic leukemia of B-cell type not having achieved remission: Secondary | ICD-10-CM | POA: Diagnosis not present

## 2020-05-05 DIAGNOSIS — Z856 Personal history of leukemia: Secondary | ICD-10-CM | POA: Diagnosis not present

## 2020-05-05 DIAGNOSIS — Z7682 Awaiting organ transplant status: Secondary | ICD-10-CM | POA: Diagnosis not present

## 2020-05-05 DIAGNOSIS — Z9484 Stem cells transplant status: Secondary | ICD-10-CM | POA: Diagnosis not present

## 2020-05-05 LAB — COMPREHENSIVE METABOLIC PANEL
ALBUMIN: 3.8 g/dL (ref 3.4–5.0)
ALT (SGPT): 73 U/L — ABNORMAL HIGH (ref 10–49)
ANION GAP: 7 mmol/L (ref 5–14)
AST (SGOT): 39 U/L — ABNORMAL HIGH (ref <=34)
BILIRUBIN TOTAL: 0.6 mg/dL (ref 0.3–1.2)
BLOOD UREA NITROGEN: 29 mg/dL — ABNORMAL HIGH (ref 9–23)
BUN / CREAT RATIO: 17
CHLORIDE: 107 mmol/L (ref 98–107)
CO2: 24 mmol/L (ref 20.0–31.0)
CREATININE: 1.7 mg/dL — ABNORMAL HIGH
EGFR CKD-EPI AA FEMALE: 37 mL/min/{1.73_m2} — ABNORMAL LOW (ref >=60)
EGFR CKD-EPI NON-AA FEMALE: 32 mL/min/{1.73_m2} — ABNORMAL LOW (ref >=60)
GLUCOSE RANDOM: 246 mg/dL — ABNORMAL HIGH (ref 70–179)
POTASSIUM: 3.8 mmol/L (ref 3.4–4.5)
PROTEIN TOTAL: 5.9 g/dL (ref 5.7–8.2)
SODIUM: 138 mmol/L (ref 135–145)

## 2020-05-05 LAB — CBC W/ AUTO DIFF
BASOPHILS ABSOLUTE COUNT: 0 10*9/L (ref 0.0–0.1)
EOSINOPHILS ABSOLUTE COUNT: 0 10*9/L (ref 0.0–0.4)
EOSINOPHILS RELATIVE PERCENT: 1.6 %
HEMATOCRIT: 18.5 % — ABNORMAL LOW (ref 36.0–46.0)
HEMOGLOBIN: 6.7 g/dL — ABNORMAL LOW (ref 12.0–16.0)
LARGE UNSTAINED CELLS: 13 % — ABNORMAL HIGH (ref 0–4)
LYMPHOCYTES ABSOLUTE COUNT: 0.2 10*9/L — ABNORMAL LOW (ref 1.5–5.0)
LYMPHOCYTES RELATIVE PERCENT: 34 %
MEAN CORPUSCULAR HEMOGLOBIN CONC: 36 g/dL (ref 31.0–37.0)
MEAN CORPUSCULAR HEMOGLOBIN: 29.6 pg (ref 26.0–34.0)
MEAN CORPUSCULAR VOLUME: 82.3 fL (ref 80.0–100.0)
MEAN PLATELET VOLUME: 10.6 fL — ABNORMAL HIGH (ref 7.0–10.0)
MONOCYTES ABSOLUTE COUNT: 0.1 10*9/L — ABNORMAL LOW (ref 0.2–0.8)
MONOCYTES RELATIVE PERCENT: 17.7 %
NEUTROPHILS ABSOLUTE COUNT: 0.2 10*9/L — CL (ref 2.0–7.5)
NEUTROPHILS RELATIVE PERCENT: 33.3 %
PLATELET COUNT: 19 10*9/L — ABNORMAL LOW (ref 150–440)
RED BLOOD CELL COUNT: 2.25 10*12/L — ABNORMAL LOW (ref 4.00–5.20)
WBC ADJUSTED: 0.5 10*9/L — ABNORMAL LOW (ref 4.5–11.0)

## 2020-05-05 LAB — CMV VIRAL LD: Lab: NOT DETECTED

## 2020-05-05 LAB — SMEAR REVIEW

## 2020-05-05 LAB — NEUTROPHIL LEFT SHIFT

## 2020-05-05 LAB — CMV DNA, QUANTITATIVE, PCR

## 2020-05-05 LAB — CHLORIDE: Chloride:SCnc:Pt:Ser/Plas:Qn:: 107

## 2020-05-05 LAB — TACROLIMUS, TROUGH: Lab: 4 — ABNORMAL LOW

## 2020-05-05 LAB — MAGNESIUM: Magnesium:MCnc:Pt:Ser/Plas:Qn:: 1.7

## 2020-05-05 MED ADMIN — heparin, porcine (PF) 100 unit/mL injection 200 Units: 200 [IU] | INTRAVENOUS | @ 15:00:00 | Stop: 2020-05-05

## 2020-05-05 MED ADMIN — sodium chloride 0.9% (NS) bolus 1,000 mL: 1000 mL | INTRAVENOUS | @ 18:00:00 | Stop: 2020-05-05

## 2020-05-05 MED ADMIN — lidocaine (XYLOCAINE) 20 mg/mL (2 %) injection 20 mL: 20 mL | INTRADERMAL | @ 17:00:00 | Stop: 2020-05-05

## 2020-05-05 MED ADMIN — LORazepam (ATIVAN) injection 1 mg: 1 mg | INTRAVENOUS | @ 17:00:00

## 2020-05-05 MED ADMIN — heparin, porcine (PF) 100 unit/mL injection 200 Units: 200 [IU] | INTRAVENOUS | @ 19:00:00 | Stop: 2020-05-06

## 2020-05-05 NOTE — Unmapped (Signed)
Patient arrived for BMBX. Meds given as ordered. Patient tolerated procedure well.

## 2020-05-05 NOTE — Unmapped (Signed)
Bone Marrow Transplant and Cellular Therapy Program  Immunosuppressive Therapy Note    Susan Kane is a 59 y.o. 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 Susan Kane is currently day +112.    Current dose: tacrolimus 1.5 mg in AM and 1 mg in PM (dose decreased on 10/12)    Goal tacrolimus Level: 5-10 ng/mL (targeting closer to 5 given mixed chimera)    Resulted level: 4 ng/mL (drawn late morning and true trough ~ 5)     Lab Results   Component Value Date/Time    TACROLIMUS 4.0 (L) 05/05/2020 11:12 AM    TACROLIMUS 6.8 05/03/2020 11:52 AM    TACROLIMUS 4.9 (L) 04/30/2020 10:57 AM    TACROLIMUS 3.5 (L) 04/27/2020 10:35 AM     Lab Results   Component Value Date/Time    CREATININE 1.70 (H) 05/05/2020 11:12 AM    CREATININE 1.57 (H) 05/03/2020 11:52 AM    CREATININE 1.30 (H) 04/30/2020 10:57 AM    CREATININE 1.17 (H) 04/27/2020 10:35 AM      Assessment/Recommendation: Tacrolimus level is at lower end of therapeutic range and Scr continues trending up. LFTs/Tbili remain WNL. Her donor percentages are improving and she needs to be above 5 per Dr Oswald Hillock but still in the lower end of target range. Given the above, will plan to continue tacrolimus  1.5 mg PO qAM and 1 mg qPM and will recheck at next clinic visit to further assess trend and need for adjustment.      We will continue to monitor levels.  Patient will be followed for changes in renal and hepatic function, toxicity, and efficacy.     Rulon Abide, PharmD, BCOP  Clinical Pharmacist Practitioner, BMT

## 2020-05-05 NOTE — Unmapped (Signed)
Patient arrived for BMBX. Meds given as ordered. Patient tolerated procedure well. Patient received 1 unit of pRBC's per protocol. Patient tolerated well and discharged after completion.

## 2020-05-05 NOTE — Unmapped (Signed)
This patient has been disenrolled from the [facility] specialty pharmacy services due to a pharmacy change. The patient is now filling at [facility].  Susan Kane prefers to use [facility] pharmacy and will reach out to them for refills.Kermit Balo  [facility] Specialty Pharmacist

## 2020-05-05 NOTE — Unmapped (Signed)
Patient Active Problem List   Diagnosis   ??? CLL       Clinician(s) Performing Procedure:  Westley Hummer ANP-BC  Supervising MD:  Dr. Armando Gang  Service Date :05/05/2020        Performing Service:MEDICINE::HEMATOLOGY/ONCOLOGY    Patient Location: Clinic         Patient   Name: Susan Kane   MRNO: [id number]   Age:59 y.o.           Indications   Indications : Post-transplant sample day +90     Procedure   Time Out: Performed immediately prior to the procedure       Name: Bone Marrow Aspiration and Biopsy      Description:   The procedure risks and alternatives of the procedure were explained to the patient.   [name] was made aware that they are not to drive for the next 24 hrs if receiving any IV medications associated with this procedure. The patient verbalized understanding and signed informed consent. After a time-out in which his patient identifiers were checked by 2 providers, the patient was laid in prone position on the table.   The  posterior superior iliac spine and iliac crest were cleaned, prepped and draped in the usual sterile fashion.     Site: left and right posterior superior iliac spine.     Aseptic preparation, draping, and technique was used.     Anesthesia: 2% Lidocaine. Premedications: Ativan 2 mg IV.     Needle : Rosenthal     Core Biopsy was obtained from left and right side.     Aspirate was obtained from and adjacent site on left and right side.  Spicules confirmed from both. .     A pressure dressing was applied to the biopsy site and the patient was placed supine for 10 - 15 minutes to promote hemostasis.  Patient tolerated the procedure well.  Hemostasis was confirmed upon discharge.      Complications   None.     Specimen(s)   Bone marrow aspirate and core biopsy. Specimen was sent for routine histopathologic stains and sectioning, flow cytometry, cytogenetics and molecular analysis.         Additional post procedure instructions:  The patient was given verbal instructions for wound care, such as to keep the biopsy site dry, covered for 24 hours, and to call your physician with including but not limited to: bleeding from the site, redness and/or puss drainage for the site and fever.      The results will be reviewed with the patient when complete.          Westley Hummer ANP-BC, AOCNP  Bone Marrow Transplant Nurse Practitioner

## 2020-05-05 NOTE — Telephone Encounter (Signed)
She is overdue for f/u

## 2020-05-07 ENCOUNTER — Encounter: Admit: 2020-05-07 | Discharge: 2020-05-08 | Payer: PRIVATE HEALTH INSURANCE

## 2020-05-07 DIAGNOSIS — R110 Nausea: Secondary | ICD-10-CM | POA: Diagnosis not present

## 2020-05-07 DIAGNOSIS — E039 Hypothyroidism, unspecified: Secondary | ICD-10-CM | POA: Diagnosis not present

## 2020-05-07 DIAGNOSIS — F418 Other specified anxiety disorders: Secondary | ICD-10-CM | POA: Diagnosis not present

## 2020-05-07 DIAGNOSIS — D849 Immunodeficiency, unspecified: Secondary | ICD-10-CM | POA: Diagnosis not present

## 2020-05-07 DIAGNOSIS — C9110 Chronic lymphocytic leukemia of B-cell type not having achieved remission: Secondary | ICD-10-CM | POA: Diagnosis not present

## 2020-05-07 DIAGNOSIS — Z79899 Other long term (current) drug therapy: Secondary | ICD-10-CM | POA: Diagnosis not present

## 2020-05-07 DIAGNOSIS — Z79890 Hormone replacement therapy (postmenopausal): Secondary | ICD-10-CM | POA: Diagnosis not present

## 2020-05-07 DIAGNOSIS — D619 Aplastic anemia, unspecified: Secondary | ICD-10-CM | POA: Diagnosis not present

## 2020-05-07 DIAGNOSIS — C9111 Chronic lymphocytic leukemia of B-cell type in remission: Secondary | ICD-10-CM | POA: Diagnosis not present

## 2020-05-07 DIAGNOSIS — G4700 Insomnia, unspecified: Secondary | ICD-10-CM | POA: Diagnosis not present

## 2020-05-07 DIAGNOSIS — Z9484 Stem cells transplant status: Secondary | ICD-10-CM | POA: Diagnosis not present

## 2020-05-07 DIAGNOSIS — R638 Other symptoms and signs concerning food and fluid intake: Secondary | ICD-10-CM | POA: Diagnosis not present

## 2020-05-07 DIAGNOSIS — D61818 Other pancytopenia: Principal | ICD-10-CM

## 2020-05-07 MED ORDER — MIRTAZAPINE 15 MG TABLET
ORAL_TABLET | Freq: Every evening | ORAL | 2 refills | 30 days
Start: 2020-05-07 — End: 2021-05-07

## 2020-05-07 MED ORDER — TACROLIMUS 0.5 MG CAPSULE, IMMEDIATE-RELEASE
ORAL_CAPSULE | Freq: Two times a day (BID) | ORAL | 5 refills | 30.00000 days | Status: CP
Start: 2020-05-07 — End: ?
  Filled 2020-05-13: qty 180, 30d supply, fill #0

## 2020-05-10 ENCOUNTER — Ambulatory Visit: Admit: 2020-05-10 | Discharge: 2020-05-11 | Payer: PRIVATE HEALTH INSURANCE

## 2020-05-10 ENCOUNTER — Encounter: Admit: 2020-05-10 | Discharge: 2020-05-11 | Payer: PRIVATE HEALTH INSURANCE

## 2020-05-10 DIAGNOSIS — Z7682 Awaiting organ transplant status: Secondary | ICD-10-CM | POA: Diagnosis not present

## 2020-05-10 DIAGNOSIS — C9110 Chronic lymphocytic leukemia of B-cell type not having achieved remission: Secondary | ICD-10-CM | POA: Diagnosis not present

## 2020-05-10 DIAGNOSIS — Z9484 Stem cells transplant status: Secondary | ICD-10-CM | POA: Diagnosis not present

## 2020-05-12 ENCOUNTER — Encounter: Admit: 2020-05-12 | Discharge: 2020-05-13 | Payer: PRIVATE HEALTH INSURANCE

## 2020-05-12 ENCOUNTER — Encounter
Admit: 2020-05-12 | Discharge: 2020-05-13 | Payer: PRIVATE HEALTH INSURANCE | Attending: Nurse Practitioner | Primary: Nurse Practitioner

## 2020-05-12 ENCOUNTER — Ambulatory Visit: Admit: 2020-05-12 | Discharge: 2020-05-13 | Payer: PRIVATE HEALTH INSURANCE

## 2020-05-12 DIAGNOSIS — D61818 Other pancytopenia: Secondary | ICD-10-CM | POA: Diagnosis not present

## 2020-05-12 DIAGNOSIS — Z9221 Personal history of antineoplastic chemotherapy: Secondary | ICD-10-CM | POA: Diagnosis not present

## 2020-05-12 DIAGNOSIS — D619 Aplastic anemia, unspecified: Secondary | ICD-10-CM | POA: Diagnosis not present

## 2020-05-12 DIAGNOSIS — D849 Immunodeficiency, unspecified: Secondary | ICD-10-CM | POA: Diagnosis not present

## 2020-05-12 DIAGNOSIS — Z9484 Stem cells transplant status: Secondary | ICD-10-CM | POA: Diagnosis not present

## 2020-05-12 DIAGNOSIS — C9110 Chronic lymphocytic leukemia of B-cell type not having achieved remission: Secondary | ICD-10-CM | POA: Diagnosis not present

## 2020-05-12 DIAGNOSIS — B1910 Unspecified viral hepatitis B without hepatic coma: Secondary | ICD-10-CM | POA: Diagnosis not present

## 2020-05-12 DIAGNOSIS — D801 Nonfamilial hypogammaglobulinemia: Secondary | ICD-10-CM | POA: Diagnosis not present

## 2020-05-12 DIAGNOSIS — E039 Hypothyroidism, unspecified: Secondary | ICD-10-CM | POA: Diagnosis not present

## 2020-05-12 DIAGNOSIS — Z79899 Other long term (current) drug therapy: Secondary | ICD-10-CM | POA: Diagnosis not present

## 2020-05-12 DIAGNOSIS — Z79890 Hormone replacement therapy (postmenopausal): Secondary | ICD-10-CM | POA: Diagnosis not present

## 2020-05-13 MED FILL — VALACYCLOVIR 500 MG TABLET: 30 days supply | Qty: 30 | Fill #1 | Status: AC

## 2020-05-13 MED FILL — ONDANSETRON HCL 8 MG TABLET: ORAL | 15 days supply | Qty: 30 | Fill #1

## 2020-05-13 MED FILL — TACROLIMUS 0.5 MG CAPSULE, IMMEDIATE-RELEASE: 30 days supply | Qty: 180 | Fill #0 | Status: AC

## 2020-05-13 MED FILL — VALACYCLOVIR 500 MG TABLET: ORAL | 30 days supply | Qty: 30 | Fill #1

## 2020-05-13 MED FILL — ONDANSETRON HCL 8 MG TABLET: 15 days supply | Qty: 30 | Fill #1 | Status: AC

## 2020-05-14 ENCOUNTER — Other Ambulatory Visit: Payer: Self-pay | Admitting: *Deleted

## 2020-05-14 DIAGNOSIS — C9110 Chronic lymphocytic leukemia of B-cell type not having achieved remission: Secondary | ICD-10-CM

## 2020-05-17 ENCOUNTER — Inpatient Hospital Stay: Payer: BC Managed Care – PPO | Attending: Oncology

## 2020-05-17 ENCOUNTER — Other Ambulatory Visit: Payer: Self-pay | Admitting: *Deleted

## 2020-05-17 ENCOUNTER — Inpatient Hospital Stay (HOSPITAL_BASED_OUTPATIENT_CLINIC_OR_DEPARTMENT_OTHER): Payer: BC Managed Care – PPO | Admitting: Oncology

## 2020-05-17 ENCOUNTER — Inpatient Hospital Stay: Payer: BC Managed Care – PPO

## 2020-05-17 ENCOUNTER — Other Ambulatory Visit: Payer: Self-pay

## 2020-05-17 VITALS — BP 112/72 | HR 85 | Temp 98.3°F | Resp 17 | Ht 66.0 in | Wt 117.4 lb

## 2020-05-17 DIAGNOSIS — Z452 Encounter for adjustment and management of vascular access device: Secondary | ICD-10-CM

## 2020-05-17 DIAGNOSIS — D619 Aplastic anemia, unspecified: Secondary | ICD-10-CM | POA: Diagnosis not present

## 2020-05-17 DIAGNOSIS — D61818 Other pancytopenia: Secondary | ICD-10-CM | POA: Diagnosis not present

## 2020-05-17 DIAGNOSIS — Z79899 Other long term (current) drug therapy: Secondary | ICD-10-CM | POA: Insufficient documentation

## 2020-05-17 DIAGNOSIS — R590 Localized enlarged lymph nodes: Secondary | ICD-10-CM | POA: Diagnosis not present

## 2020-05-17 DIAGNOSIS — E039 Hypothyroidism, unspecified: Secondary | ICD-10-CM | POA: Diagnosis not present

## 2020-05-17 DIAGNOSIS — M1990 Unspecified osteoarthritis, unspecified site: Secondary | ICD-10-CM | POA: Insufficient documentation

## 2020-05-17 DIAGNOSIS — D801 Nonfamilial hypogammaglobulinemia: Secondary | ICD-10-CM | POA: Insufficient documentation

## 2020-05-17 DIAGNOSIS — C9110 Chronic lymphocytic leukemia of B-cell type not having achieved remission: Secondary | ICD-10-CM

## 2020-05-17 DIAGNOSIS — R161 Splenomegaly, not elsewhere classified: Secondary | ICD-10-CM | POA: Insufficient documentation

## 2020-05-17 DIAGNOSIS — D696 Thrombocytopenia, unspecified: Secondary | ICD-10-CM

## 2020-05-17 DIAGNOSIS — Z95828 Presence of other vascular implants and grafts: Secondary | ICD-10-CM

## 2020-05-17 DIAGNOSIS — D649 Anemia, unspecified: Secondary | ICD-10-CM

## 2020-05-17 DIAGNOSIS — R519 Headache, unspecified: Secondary | ICD-10-CM | POA: Diagnosis not present

## 2020-05-17 DIAGNOSIS — R112 Nausea with vomiting, unspecified: Secondary | ICD-10-CM | POA: Insufficient documentation

## 2020-05-17 DIAGNOSIS — Z86018 Personal history of other benign neoplasm: Secondary | ICD-10-CM | POA: Diagnosis not present

## 2020-05-17 DIAGNOSIS — I951 Orthostatic hypotension: Secondary | ICD-10-CM | POA: Insufficient documentation

## 2020-05-17 LAB — PREPARE RBC (CROSSMATCH)

## 2020-05-17 LAB — SAMPLE TO BLOOD BANK

## 2020-05-17 LAB — CBC WITH DIFFERENTIAL (CANCER CENTER ONLY)
Abs Immature Granulocytes: 0 10*3/uL (ref 0.00–0.07)
Basophils Absolute: 0 10*3/uL (ref 0.0–0.1)
Basophils Relative: 0 %
Eosinophils Absolute: 0 10*3/uL (ref 0.0–0.5)
Eosinophils Relative: 5 %
HCT: 18.9 % — ABNORMAL LOW (ref 36.0–46.0)
Hemoglobin: 6.7 g/dL — CL (ref 12.0–15.0)
Immature Granulocytes: 0 %
Lymphocytes Relative: 52 %
Lymphs Abs: 0.2 10*3/uL — ABNORMAL LOW (ref 0.7–4.0)
MCH: 28.4 pg (ref 26.0–34.0)
MCHC: 35.4 g/dL (ref 30.0–36.0)
MCV: 80.1 fL (ref 80.0–100.0)
Monocytes Absolute: 0.1 10*3/uL (ref 0.1–1.0)
Monocytes Relative: 33 %
Neutro Abs: 0 10*3/uL — CL (ref 1.7–7.7)
Neutrophils Relative %: 10 %
Platelet Count: 15 10*3/uL — ABNORMAL LOW (ref 150–400)
RBC: 2.36 MIL/uL — ABNORMAL LOW (ref 3.87–5.11)
RDW: 14.2 % (ref 11.5–15.5)
WBC Count: 0.4 10*3/uL — CL (ref 4.0–10.5)
nRBC: 0 % (ref 0.0–0.2)

## 2020-05-17 LAB — ABO/RH: ABO/RH(D): O NEG

## 2020-05-17 MED ORDER — SODIUM CHLORIDE 0.9% FLUSH
10.0000 mL | INTRAVENOUS | Status: DC | PRN
  Administered 2020-05-17: 10 mL
  Filled 2020-05-17: qty 10

## 2020-05-17 MED ORDER — HEPARIN SOD (PORK) LOCK FLUSH 100 UNIT/ML IV SOLN
250.0000 [IU] | Freq: Once | INTRAVENOUS | Status: AC
  Administered 2020-05-17: 250 [IU] via INTRAVENOUS
  Filled 2020-05-17: qty 5

## 2020-05-17 MED ORDER — SODIUM CHLORIDE 0.9% IV SOLUTION
250.0000 mL | Freq: Once | INTRAVENOUS | Status: AC
  Administered 2020-05-17: 250 mL via INTRAVENOUS
  Filled 2020-05-17: qty 250

## 2020-05-17 MED ORDER — SODIUM CHLORIDE 0.9% FLUSH
10.0000 mL | INTRAVENOUS | Status: AC | PRN
  Administered 2020-05-17: 10 mL
  Filled 2020-05-17: qty 10

## 2020-05-17 MED ORDER — HEPARIN SOD (PORK) LOCK FLUSH 100 UNIT/ML IV SOLN
250.0000 [IU] | INTRAVENOUS | Status: AC | PRN
  Filled 2020-05-17: qty 5

## 2020-05-17 MED ORDER — HEPARIN SOD (PORK) LOCK FLUSH 100 UNIT/ML IV SOLN
500.0000 [IU] | Freq: Once | INTRAVENOUS | Status: AC | PRN
  Administered 2020-05-17: 500 [IU]
  Filled 2020-05-17: qty 5

## 2020-05-17 NOTE — Patient Instructions (Signed)

## 2020-05-17 NOTE — Progress Notes (Signed)
[place] OFFICE PROGRESS NOTE   Diagnosis: CLL, aplastic anemia  INTERVAL HISTORY:   [name] returned home from [facility] last week.  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  She reports tolerated the procedure well and did not have significant side effects with the induction chemotherapy regimen.  She has persistent pancytopenia.  She is maintained on antibiotic/antiviral/antifungal prophylaxis and continues tacrolimus and Promacta.  No fever or bleeding.  She is working on increasing her food intake and exercise.  She underwent a bone marrow biopsy at [facility] on 05/05/2020.  The bone marrow was 30% cellular with a normal number of megakaryocytes, increased erythroid precursors and decreased granulopoiesis.  Occasional blasts were noted.  A manual count found 5% blast on the left side of marrow and 3% blast on the right side of marrow.  She is scheduled for follow-up with Dr. Oretha Ellis tomorrow.  Objective:  Vital signs in last 24 hours:  Blood pressure 112/72, pulse 85, temperature 98.3 F (36.8 C), temperature source Tympanic, resp. rate 17, height _0  (1.676 m), weight 117 lb 6.4 oz (53.3 kg), SpO2 100 %.    HEENT: No thrush or bleeding Resp: Lungs clear bilaterally Cardio: Regular rate and rhythm GI: No hepatosplenomegaly Vascular: No leg edema  Skin: No rash, dryness over the trunk and extremities  Portacath/PICC-without erythema  Lab Results:  Lab Results  Component Value Date   WBC 0.4 (LL) 05/17/2020   HGB 6.7 (LL) 05/17/2020   HCT 18.9 (L) 05/17/2020   MCV 80.1 05/17/2020   PLT 15 (L) 05/17/2020   NEUTROABS 0.0 (LL) 05/17/2020    Medications: I have reviewed the patient's current medications.   Assessment/Plan: 1. CLL-diagnosed in August 2010, flow cytometry consistent with CLL Enlarged leftinguinal lymph node January 2019,smallneck/axillary nodes and palpable splenomegaly  09/11/2017  CTson 09/17/2017-3 cm necrotic appearing lymph node in the left inguinal region, borderline enlarged pelvic/retroperitoneal, chest, and axillary nodes. Mild splenomegaly.  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, no evidence of 11q22deletion  Bone marrow biopsy 02/26/2018-hypercellular marrow with extensive involvement by CLL, lymphocytes represent85% of all cells  Ibrutinib initiated 04/03/2018  Ibrutinib placed on hold 04/11/2018 due to onset of arthralgias  Ibrutinib resumed 04/16/2018, discontinued 04/25/2018 secondary to severe arthralgias/arthritis  Ibrutinib resumed at a dose of 140 mg daily 05/03/2018  Ibrutinib dose adjusted to 140 mg alternating with 229m 06/25/2018  Ibrutinib discontinued 07/03/2018 secondary to severe arthralgias  Acalabrutinib 08/16/2018, discontinued 11/15/2018 secondary to persistent severe transfusion dependent anemia and neutropenia/thrombocytopenia, last dose 11/14/2018  Bone marrow biopsy 11/21/2018-decreased cellularity, involvement by CLL, decreased erythroid and granulocytic precursors, decreased megakaryocytes  Cycle 1 rituximab 12/06/2018  Bone marrow biopsy 12/24/2018 at [facility]-hypocellular bone marrow (10%) involved by CLL, representing 50% of marrow cellularity; markedly decreased trilineage hematopoiesis including essentially absent erythropoiesis  Bone marrow biopsy 03/06/2027 [facility]-hypocellular  bone marrow (20%) with marked reduction of maturing hematopoietic elements; numerous lymphoid aggregates consistent with involvement by CLL, representing approximately 50% of marrow cellularity by flow cytometry  [facility] followed by prednisone  taper  Eltrombopag beginning 07/05/2019  Cyclosporine discontinued 07/08/2019  Tacrolimus 07/10/2019, discontinued 09/24/2019  Bone marrow biopsy 10/07/2019 at [facility] bone marrow sampling (<5% cellular) essentially devoid of hematopoietic elements; Persistent chronic lymphocytic leukemia, representing 23% of marrow cellularity by flow cytometric analysis; Routine cytogenetic analysis is pending.  CT CAP 10/10/2019 showed no acute process or evidence of active lymphoma/leukemia within the chest, abdomen, or pelvis. Resection or resolution of previously described necrotic left inguinal node  Tacrolimus resumed 10/17/2019, discontinued on 10/28/2019 and resumed at a dose of 0.5 mg twice daily on 10/29/2019, tacrolimus escalated to 1 mg twice daily 11/14/2019  Bone marrow biopsy at [facility] 12/26/1998 21-20% cellular marrow, CLL involving approximately 20% of the cellular component  Eltrombopag discontinued week of 12/29/2019  01/01/2020 -induction chemotherapy with bendamustine, fludarabine, rituximab, tacrolimus started day -3 and methotrexate given on day +1, +3, +6, and +11  Bone marrow biopsy 1013 2021-30% cellular marrow with adequate megakaryocytes, increased erythropoiesis and diminished granulopoiesis, 3-5% blasts, cells are 94% donor origin  2.Hypothyroidism 3.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 Augmentin prescribed. 6.Anemia secondary to CLL-DAT negative, bilirubin and LDH normal  June 2019, progressive symptomatic anemia 04/01/2018, red cell transfusions 04/01/2018,followed by multiple additional red cell transfusions 7.Hypogammaglobulinemia 8. Pancytopenia secondary to CLL and a hypocellular bone marrow  G-CSF and Nplate started 0/76/1518, G-CSF changed to daily beginning 12/17/2018; G-CSF discontinued 12/31/2018, last Nplate 01/15/2019  Began prednisone 60 mg daily 01/11/19, tolerating moderately well except jitteriness, irritability, and difficulty sleeping. Tapered to 40 mg daily x1 week starting 01/24/19, reduce by 10 mg each week until discontinued. Prednisone discontinued 02/20/2019.  Promacta started 01/20/2019, dose increased to 100 mg daily 02/04/2019; dose increased to 150 mg daily 02/21/2019  IVIG daily for 2 days beginning 02/25/2019  9. 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 No lymphadenopathy or splenomegaly. Exjade beginning 06/28/2019. 13. Elevated creatinine-cyclosporine toxicity?Elevated 10/28/2019 with hyperkalemia-tacrolimus induced? 14.Vaginal bleeding beginning 07/13/2019-Mirena device removed, uterine fibroids and possible endometrial fluid or thickening in the fundus per CT CAP on 10/10/19 15.Elevated lithium level 08/08/2019, lithium toxicity?-Lithium held 08/08/2019-08/12/2019, then resumed at a lower dose 16. Deconditioning, secondary to n/v/d 17. Orthostatic hypotension 09/15/19,received500 cc NS in clinic     Disposition: [name] is now approximately 100 days out from an allogeneic bone marrow transplant.  She is in clinical remission from CLL.  The bone marrow biopsy on 05/05/2020 revealed significant cellularity and donor engraftment.  She has persistent severe pancytopenia.  The etiology of the  persistent pancytopenia is unclear.  I will follow up with Dr. Oretha Ellis  She is scheduled for follow-up at [facility] tomorrow.  She will be transfused 1 unit of packed red blood cells today.  She will return for an office visit here on 05/28/2020.  She knows to call for a fever or bleeding.  Betsy Coder, MD  05/17/2020  1:45 PM

## 2020-05-18 ENCOUNTER — Non-Acute Institutional Stay: Admit: 2020-05-18 | Discharge: 2020-05-18 | Payer: PRIVATE HEALTH INSURANCE

## 2020-05-18 ENCOUNTER — Encounter: Admit: 2020-05-18 | Discharge: 2020-05-18 | Payer: PRIVATE HEALTH INSURANCE

## 2020-05-18 ENCOUNTER — Other Ambulatory Visit: Payer: Self-pay | Admitting: *Deleted

## 2020-05-18 ENCOUNTER — Telehealth: Payer: Self-pay | Admitting: *Deleted

## 2020-05-18 ENCOUNTER — Telehealth: Payer: Self-pay | Admitting: Oncology

## 2020-05-18 DIAGNOSIS — Z9221 Personal history of antineoplastic chemotherapy: Secondary | ICD-10-CM | POA: Diagnosis not present

## 2020-05-18 DIAGNOSIS — Z9484 Stem cells transplant status: Secondary | ICD-10-CM | POA: Diagnosis not present

## 2020-05-18 DIAGNOSIS — Z79890 Hormone replacement therapy (postmenopausal): Secondary | ICD-10-CM | POA: Diagnosis not present

## 2020-05-18 DIAGNOSIS — C9110 Chronic lymphocytic leukemia of B-cell type not having achieved remission: Secondary | ICD-10-CM | POA: Diagnosis not present

## 2020-05-18 DIAGNOSIS — D619 Aplastic anemia, unspecified: Secondary | ICD-10-CM | POA: Diagnosis not present

## 2020-05-18 DIAGNOSIS — D801 Nonfamilial hypogammaglobulinemia: Secondary | ICD-10-CM | POA: Diagnosis not present

## 2020-05-18 DIAGNOSIS — E039 Hypothyroidism, unspecified: Secondary | ICD-10-CM | POA: Diagnosis not present

## 2020-05-18 DIAGNOSIS — R161 Splenomegaly, not elsewhere classified: Secondary | ICD-10-CM | POA: Diagnosis not present

## 2020-05-18 DIAGNOSIS — Z79899 Other long term (current) drug therapy: Secondary | ICD-10-CM | POA: Diagnosis not present

## 2020-05-18 DIAGNOSIS — D649 Anemia, unspecified: Secondary | ICD-10-CM

## 2020-05-18 DIAGNOSIS — Q998 Other specified chromosome abnormalities: Secondary | ICD-10-CM | POA: Diagnosis not present

## 2020-05-18 MED FILL — FOLIC ACID 1 MG TABLET: 30 days supply | Qty: 30 | Fill #3 | Status: AC

## 2020-05-18 MED FILL — FOLIC ACID 1 MG TABLET: ORAL | 30 days supply | Qty: 30 | Fill #3

## 2020-05-18 MED FILL — LEVOTHYROXINE 100 MCG TABLET: ORAL | 30 days supply | Qty: 30 | Fill #2

## 2020-05-18 MED FILL — LEVOTHYROXINE 100 MCG TABLET: 30 days supply | Qty: 30 | Fill #2 | Status: AC

## 2020-05-18 NOTE — Telephone Encounter (Signed)
Scheduled appointment per 10/25 los. Spoke to patient who is aware of appointment date and time.

## 2020-05-18 NOTE — Telephone Encounter (Signed)
Called patient to make her aware of transfusion tomorrow at [facility]. She requests to cancel since her Hgb today at [facility] was 8.4 and she is feeling will. Notified [facility] office to cancel.

## 2020-05-18 NOTE — Progress Notes (Signed)
Notified blood bank that her 2nd unit of blood will be given at [facility] tomorrow. Notified Mary, RN at [facility] that orders are in.

## 2020-05-19 ENCOUNTER — Other Ambulatory Visit: Payer: Self-pay | Admitting: *Deleted

## 2020-05-20 DIAGNOSIS — D801 Nonfamilial hypogammaglobulinemia: Secondary | ICD-10-CM | POA: Diagnosis not present

## 2020-05-20 DIAGNOSIS — D619 Aplastic anemia, unspecified: Secondary | ICD-10-CM | POA: Diagnosis not present

## 2020-05-20 DIAGNOSIS — C9110 Chronic lymphocytic leukemia of B-cell type not having achieved remission: Secondary | ICD-10-CM | POA: Diagnosis not present

## 2020-05-21 LAB — TYPE AND SCREEN
ABO/RH(D): O NEG
Antibody Screen: NEGATIVE
Unit division: 0
Unit division: 0

## 2020-05-21 LAB — BPAM RBC
Blood Product Expiration Date: 202111162359
Blood Product Expiration Date: 202111202359
ISSUE DATE / TIME: 202110251511
Unit Type and Rh: 9500
Unit Type and Rh: 9500

## 2020-05-24 DIAGNOSIS — Z9484 Stem cells transplant status: Principal | ICD-10-CM

## 2020-05-25 ENCOUNTER — Encounter: Admit: 2020-05-25 | Discharge: 2020-05-26 | Payer: PRIVATE HEALTH INSURANCE

## 2020-05-25 DIAGNOSIS — D619 Aplastic anemia, unspecified: Secondary | ICD-10-CM | POA: Diagnosis not present

## 2020-05-25 DIAGNOSIS — Z48290 Encounter for aftercare following bone marrow transplant: Secondary | ICD-10-CM | POA: Diagnosis not present

## 2020-05-25 DIAGNOSIS — C9111 Chronic lymphocytic leukemia of B-cell type in remission: Secondary | ICD-10-CM | POA: Diagnosis not present

## 2020-05-25 DIAGNOSIS — C9110 Chronic lymphocytic leukemia of B-cell type not having achieved remission: Secondary | ICD-10-CM | POA: Diagnosis not present

## 2020-05-25 DIAGNOSIS — D84822 Immunocompromised state associated with stem cell transplant (CMS-HCC): Secondary | ICD-10-CM

## 2020-05-25 DIAGNOSIS — Z9484 Stem cells transplant status: Secondary | ICD-10-CM | POA: Diagnosis not present

## 2020-05-25 DIAGNOSIS — Z7682 Awaiting organ transplant status: Principal | ICD-10-CM

## 2020-05-25 DIAGNOSIS — D61818 Other pancytopenia: Principal | ICD-10-CM

## 2020-05-26 MED ORDER — TACROLIMUS 0.5 MG CAPSULE, IMMEDIATE-RELEASE
ORAL_CAPSULE | Freq: Two times a day (BID) | ORAL | 5 refills | 30.00000 days | Status: CP
Start: 2020-05-26 — End: 2020-06-14

## 2020-05-28 ENCOUNTER — Inpatient Hospital Stay: Payer: BC Managed Care – PPO

## 2020-05-28 ENCOUNTER — Other Ambulatory Visit: Payer: Self-pay

## 2020-05-28 ENCOUNTER — Inpatient Hospital Stay: Payer: BC Managed Care – PPO | Attending: Nurse Practitioner | Admitting: Nurse Practitioner

## 2020-05-28 ENCOUNTER — Encounter: Payer: Self-pay | Admitting: Nurse Practitioner

## 2020-05-28 VITALS — BP 133/74 | HR 95 | Temp 100.0°F | Resp 20 | Ht 66.0 in | Wt 117.6 lb

## 2020-05-28 DIAGNOSIS — D696 Thrombocytopenia, unspecified: Secondary | ICD-10-CM

## 2020-05-28 DIAGNOSIS — D801 Nonfamilial hypogammaglobulinemia: Secondary | ICD-10-CM | POA: Insufficient documentation

## 2020-05-28 DIAGNOSIS — Z452 Encounter for adjustment and management of vascular access device: Secondary | ICD-10-CM

## 2020-05-28 DIAGNOSIS — E039 Hypothyroidism, unspecified: Secondary | ICD-10-CM | POA: Diagnosis not present

## 2020-05-28 DIAGNOSIS — D619 Aplastic anemia, unspecified: Secondary | ICD-10-CM | POA: Diagnosis not present

## 2020-05-28 DIAGNOSIS — C9110 Chronic lymphocytic leukemia of B-cell type not having achieved remission: Secondary | ICD-10-CM

## 2020-05-28 DIAGNOSIS — Z95828 Presence of other vascular implants and grafts: Secondary | ICD-10-CM

## 2020-05-28 DIAGNOSIS — Z79899 Other long term (current) drug therapy: Secondary | ICD-10-CM | POA: Insufficient documentation

## 2020-05-28 LAB — RETICULOCYTES
Immature Retic Fract: 13.3 % (ref 2.3–15.9)
RBC.: 2.27 MIL/uL — ABNORMAL LOW (ref 3.87–5.11)
Retic Count, Absolute: 27.5 10*3/uL (ref 19.0–186.0)
Retic Ct Pct: 1.2 % (ref 0.4–3.1)

## 2020-05-28 LAB — CBC WITH DIFFERENTIAL (CANCER CENTER ONLY)
Abs Immature Granulocytes: 0.02 10*3/uL (ref 0.00–0.07)
Basophils Absolute: 0 10*3/uL (ref 0.0–0.1)
Basophils Relative: 0 %
Eosinophils Absolute: 0 10*3/uL (ref 0.0–0.5)
Eosinophils Relative: 8 %
HCT: 18.1 % — ABNORMAL LOW (ref 36.0–46.0)
Hemoglobin: 6.2 g/dL — CL (ref 12.0–15.0)
Immature Granulocytes: 5 %
Lymphocytes Relative: 37 %
Lymphs Abs: 0.1 10*3/uL — ABNORMAL LOW (ref 0.7–4.0)
MCH: 27.1 pg (ref 26.0–34.0)
MCHC: 34.3 g/dL (ref 30.0–36.0)
MCV: 79 fL — ABNORMAL LOW (ref 80.0–100.0)
Monocytes Absolute: 0.1 10*3/uL (ref 0.1–1.0)
Monocytes Relative: 29 %
Neutro Abs: 0.1 10*3/uL — CL (ref 1.7–7.7)
Neutrophils Relative %: 21 %
Platelet Count: 16 10*3/uL — ABNORMAL LOW (ref 150–400)
RBC: 2.29 MIL/uL — ABNORMAL LOW (ref 3.87–5.11)
RDW: 15 % (ref 11.5–15.5)
WBC Count: 0.4 10*3/uL — CL (ref 4.0–10.5)
nRBC: 0 % (ref 0.0–0.2)

## 2020-05-28 LAB — PREPARE RBC (CROSSMATCH)

## 2020-05-28 MED ORDER — SODIUM CHLORIDE 0.9% IV SOLUTION
250.0000 mL | Freq: Once | INTRAVENOUS | Status: AC
  Administered 2020-05-28: 250 mL via INTRAVENOUS
  Filled 2020-05-28: qty 250

## 2020-05-28 MED ORDER — HEPARIN SOD (PORK) LOCK FLUSH 100 UNIT/ML IV SOLN
250.0000 [IU] | INTRAVENOUS | Status: AC | PRN
  Administered 2020-05-28: 250 [IU]
  Filled 2020-05-28: qty 5

## 2020-05-28 MED ORDER — HEPARIN SOD (PORK) LOCK FLUSH 100 UNIT/ML IV SOLN
250.0000 [IU] | Freq: Once | INTRAVENOUS | Status: DC
  Filled 2020-05-28: qty 5

## 2020-05-28 MED ORDER — SODIUM CHLORIDE 0.9% FLUSH
3.0000 mL | INTRAVENOUS | Status: AC | PRN
  Administered 2020-05-28: 3 mL
  Filled 2020-05-28: qty 10

## 2020-05-28 MED ORDER — SODIUM CHLORIDE 0.9% FLUSH
10.0000 mL | INTRAVENOUS | Status: DC | PRN
  Filled 2020-05-28: qty 10

## 2020-05-28 NOTE — Progress Notes (Addendum)
[place] OFFICE PROGRESS NOTE   Diagnosis: CLL, aplastic anemia  INTERVAL HISTORY:   Meagan Walsh returns as scheduled.  She is feeling tired and thinks she needs a blood transfusion.  She has dyspnea on exertion.  No bleeding.  No fever.  Objective:  Vital signs in last 24 hours:  Blood pressure 133/74, pulse 95, temperature 100 F (37.8 C), resp. rate 20, height _0  (1.676 m), weight 117 lb 9.6 oz (53.3 kg).    HEENT: No thrush or ulcers.  No bleeding. Resp: Lungs clear bilaterally. Cardio: Regular rate and rhythm. GI: No hepatosplenomegaly. Vascular: No leg edema. Neuro: Alert and oriented.  Hickman catheter right chest without erythema.  Lab Results:  Lab Results  Component Value Date   WBC 0.4 (LL) 05/28/2020   HGB 6.2 (LL) 05/28/2020   HCT 18.1 (L) 05/28/2020   MCV 79.0 (L) 05/28/2020   PLT 16 (L) 05/28/2020   NEUTROABS PENDING 05/28/2020    Imaging:  No results found.  Medications: I have reviewed the patient's current medications.  Assessment/Plan: 1. 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 Mild splenomegaly.  Ultrasound-guided biopsy of the left inguinal lymph node 09/18/2017, slightly "purulent "fluid aspirated, core biopsy is consistent with an atypical lymphoid proliferation-extensive necrosis with surrounding epithelioid histiocytes, limited intact lymphoid tissue involved with CLL  Incisional biopsy of a necrotic/purulent left inguinal lymph node on 10/01/2017-extensive necrosis with granulomatous inflammation, small amount of viable lymphoid tissue involved with CLL, AFB and fungal stains negative  Peripheral blood FISH analysis 02/05/2018-deletion  13q14, no evidence of p53 (17p13) deletion, no evidence of 11q22deletion  Bone marrow biopsy 02/26/2018-hypercellular marrow with extensive involvement by CLL, lymphocytes represent85% of all cells  Ibrutinib initiated 04/03/2018  Ibrutinib placed on hold 04/11/2018 due to onset of arthralgias  Ibrutinib resumed 04/16/2018, discontinued 04/25/2018 secondary to severe arthralgias/arthritis  Ibrutinib resumed at a dose of 140 mg daily 05/03/2018  Ibrutinib dose adjusted to 140 mg alternating with 256m 06/25/2018  Ibrutinib discontinued 07/03/2018 secondary to severe arthralgias  Acalabrutinib 08/16/2018, discontinued 11/15/2018 secondary to persistent severe transfusion dependent anemia and neutropenia/thrombocytopenia, last dose 11/14/2018  Bone marrow biopsy 11/21/2018-decreased cellularity, involvement by CLL, decreased erythroid and granulocytic precursors, decreased megakaryocytes  Cycle 1 rituximab 12/06/2018  Bone marrow biopsy 12/24/2018 at [facility]-hypocellular bone marrow (10%) involved by CLL, representing 50% of marrow cellularity; markedly decreased trilineage hematopoiesis including essentially absent erythropoiesis  Bone marrow biopsy 03/06/2027 [facility]-hypocellular bone marrow (20%) with marked reduction of maturing hematopoietic elements; numerous lymphoid aggregates consistent with involvement by CLL, representing approximately 50% of marrow cellularity by flow cytometry  [facility] followed by prednisone taper  Eltrombopag beginning 07/05/2019  Cyclosporine discontinued 07/08/2019  Tacrolimus 07/10/2019, discontinued 09/24/2019  [facility] (<5% cellular) essentially devoid of hematopoietic elements; Persistent chronic lymphocytic leukemia, representing 23% of marrow cellularity by flow cytometric analysis; Routine cytogenetic analysis is pending.  CT CAP 10/10/2019 showed no acute process or  evidence of active lymphoma/leukemia within the chest, abdomen, or pelvis. Resection or resolution of previously described necrotic left inguinal node  Tacrolimus resumed 10/17/2019, discontinued on 10/28/2019 and resumed at a dose of 0.5 mg twice daily on 10/29/2019, tacrolimus escalated to 1 mg twice daily 11/14/2019  Bone marrow biopsy at  [facility] 12/26/1998 21-20% cellular marrow, CLL involving approximately 20% of the cellular component  Eltrombopag discontinued week of 12/29/2019  01/01/2020 -induction chemotherapy with bendamustine, fludarabine, rituximab, tacrolimus started day -3 and methotrexate given on day +1, +3, +6, and +11  Bone marrow biopsy 1013 2021-30% cellular marrow with adequate megakaryocytes, increased erythropoiesis and diminished granulopoiesis, 3-5% blasts, cells are 94% donor origin  2.Hypothyroidism 3.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 Augmentin prescribed. 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 Pancytopenia secondary to CLL and a hypocellular bone marrow  G-CSF and Nplate started 6/58/2608, G-CSF changed to daily beginning 12/17/2018; G-CSF discontinued 12/31/2018, last Nplate 01/15/2019  Began prednisone 60 mg daily 01/11/19, tolerating moderately well except jitteriness, irritability, and difficulty sleeping. Tapered to 40 mg daily x1 week starting 01/24/19, reduce by 10 mg each week until discontinued. Prednisone discontinued 02/20/2019.  Promacta started 01/20/2019, dose increased to 100 mg daily 02/04/2019; dose increased to 150 mg daily  02/21/2019  IVIG daily for 2 days beginning 02/25/2019  9. 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 No lymphadenopathy or splenomegaly. Exjade beginning 06/28/2019. 13. Elevated creatinine-cyclosporine toxicity?Elevated 10/28/2019 with hyperkalemia-tacrolimus induced? 14.Vaginal bleeding beginning 07/13/2019-Mirena device removed, uterine fibroids and possible endometrial fluid or thickening in the fundus per CT CAP on 10/10/19 15.Elevated lithium level 08/08/2019, lithium toxicity?-Lithium held 08/08/2019-08/12/2019, then resumed at a lower dose 16. Deconditioning, secondary to n/v/d 17. Orthostatic hypotension 09/15/19,received500 cc NS in clinic   Disposition: Meagan Walsh appears stable.  She continues to have severe pancytopenia.  She is symptomatic from the anemia.  We are making arrangements for a blood transfusion today.  We again reviewed neutropenic and thrombocytopenic precautions.  She understands to contact the office with fever, chills, other signs of infection, bleeding.  She will return for labs in 1 week.  Lab and follow-up in 2 weeks.  We are available to see her between visits if needed.  She continues close follow-up with Dr. Theodoro Clock at [facility].  Patient seen with Dr. Benay Spice.    Ned Card ANP/GNP-BC   05/28/2020  9:55 AM  This was a shared visit with Ned Card.  Meagan Walsh has persistent pancytopenia.  We will continue transfusion support as needed.  She is followed by the transplant team at [facility] for post transplant care and decisions on further treatment of the aplastic anemia.  [name], MD

## 2020-05-28 NOTE — Patient Instructions (Signed)

## 2020-05-29 LAB — TYPE AND SCREEN
ABO/RH(D): O NEG
Antibody Screen: NEGATIVE
Unit division: 0
Unit division: 0

## 2020-05-29 LAB — BPAM RBC
Blood Product Expiration Date: 202111212359
Blood Product Expiration Date: 202112032359
ISSUE DATE / TIME: 202111051246
ISSUE DATE / TIME: 202111051246
Unit Type and Rh: 9500
Unit Type and Rh: 9500

## 2020-05-31 ENCOUNTER — Telehealth: Payer: Self-pay | Admitting: Nurse Practitioner

## 2020-05-31 ENCOUNTER — Telehealth: Payer: Self-pay | Admitting: *Deleted

## 2020-05-31 NOTE — Telephone Encounter (Signed)
Called to inquire as to her f/u appointments. Provided her appointment dates/times for 11/12 and 11/19

## 2020-05-31 NOTE — Telephone Encounter (Signed)
Scheduled appointments per 11/5 los. Spoke to patient who is aware of appointments dates and times.

## 2020-06-01 ENCOUNTER — Other Ambulatory Visit: Payer: Self-pay | Admitting: *Deleted

## 2020-06-01 MED ORDER — FAMOTIDINE 20 MG PO TABS: 20.0000 mg | ORAL_TABLET | Freq: Two times a day (BID) | ORAL | 2 refills | Status: DC

## 2020-06-01 NOTE — Progress Notes (Signed)
Received refill request from [facility] for famotidine 20 mg bid. OK per Dr. Benay Spice.

## 2020-06-04 ENCOUNTER — Other Ambulatory Visit: Payer: Self-pay

## 2020-06-04 ENCOUNTER — Telehealth: Payer: Self-pay | Admitting: *Deleted

## 2020-06-04 ENCOUNTER — Encounter: Payer: Self-pay | Admitting: *Deleted

## 2020-06-04 ENCOUNTER — Inpatient Hospital Stay: Payer: BC Managed Care – PPO

## 2020-06-04 DIAGNOSIS — C9110 Chronic lymphocytic leukemia of B-cell type not having achieved remission: Secondary | ICD-10-CM | POA: Diagnosis not present

## 2020-06-04 DIAGNOSIS — Z452 Encounter for adjustment and management of vascular access device: Secondary | ICD-10-CM

## 2020-06-04 DIAGNOSIS — Z95828 Presence of other vascular implants and grafts: Secondary | ICD-10-CM

## 2020-06-04 DIAGNOSIS — E039 Hypothyroidism, unspecified: Secondary | ICD-10-CM | POA: Diagnosis not present

## 2020-06-04 DIAGNOSIS — D619 Aplastic anemia, unspecified: Secondary | ICD-10-CM | POA: Diagnosis not present

## 2020-06-04 DIAGNOSIS — Z79899 Other long term (current) drug therapy: Secondary | ICD-10-CM | POA: Diagnosis not present

## 2020-06-04 DIAGNOSIS — D696 Thrombocytopenia, unspecified: Secondary | ICD-10-CM

## 2020-06-04 DIAGNOSIS — D801 Nonfamilial hypogammaglobulinemia: Secondary | ICD-10-CM | POA: Diagnosis not present

## 2020-06-04 DIAGNOSIS — Z9481 Bone marrow transplant status: Principal | ICD-10-CM

## 2020-06-04 LAB — CBC WITH DIFFERENTIAL (CANCER CENTER ONLY)
Abs Immature Granulocytes: 0.01 10*3/uL (ref 0.00–0.07)
Basophils Absolute: 0 10*3/uL (ref 0.0–0.1)
Basophils Relative: 0 %
Eosinophils Absolute: 0 10*3/uL (ref 0.0–0.5)
Eosinophils Relative: 4 %
HCT: 23.9 % — ABNORMAL LOW (ref 36.0–46.0)
Hemoglobin: 8.3 g/dL — ABNORMAL LOW (ref 12.0–15.0)
Immature Granulocytes: 2 %
Lymphocytes Relative: 45 %
Lymphs Abs: 0.2 10*3/uL — ABNORMAL LOW (ref 0.7–4.0)
MCH: 28.1 pg (ref 26.0–34.0)
MCHC: 34.7 g/dL (ref 30.0–36.0)
MCV: 81 fL (ref 80.0–100.0)
Monocytes Absolute: 0.1 10*3/uL (ref 0.1–1.0)
Monocytes Relative: 23 %
Neutro Abs: 0.1 10*3/uL — CL (ref 1.7–7.7)
Neutrophils Relative %: 26 %
Platelet Count: 13 10*3/uL — ABNORMAL LOW (ref 150–400)
RBC: 2.95 MIL/uL — ABNORMAL LOW (ref 3.87–5.11)
RDW: 14.1 % (ref 11.5–15.5)
WBC Count: 0.5 10*3/uL — CL (ref 4.0–10.5)
nRBC: 0 % (ref 0.0–0.2)

## 2020-06-04 LAB — SAMPLE TO BLOOD BANK

## 2020-06-04 LAB — RETICULOCYTES
Immature Retic Fract: 10.4 % (ref 2.3–15.9)
RBC.: 2.94 MIL/uL — ABNORMAL LOW (ref 3.87–5.11)
Retic Count, Absolute: 22.9 10*3/uL (ref 19.0–186.0)
Retic Ct Pct: 0.8 % (ref 0.4–3.1)

## 2020-06-04 MED ORDER — HEPARIN SOD (PORK) LOCK FLUSH 100 UNIT/ML IV SOLN
250.0000 [IU] | Freq: Once | INTRAVENOUS | Status: AC
  Administered 2020-06-04: 250 [IU]
  Filled 2020-06-04: qty 5

## 2020-06-04 MED ORDER — SODIUM CHLORIDE 0.9% FLUSH
10.0000 mL | INTRAVENOUS | Status: DC | PRN
  Administered 2020-06-04: 10 mL
  Filled 2020-06-04: qty 10

## 2020-06-04 NOTE — Telephone Encounter (Addendum)
CRITICAL VALUE STICKER  CRITICAL VALUE: Meagan Walsh  RECEIVER (on-site recipient of call):Sandi K, RN  DATE & TIME NOTIFIED: 06/04/2020; 1028  MESSENGER (representative from lab):Lelan Pons  MD NOTIFIED: Cristy Friedlander, RN w/Dr. Benay Spice  TIME OF NOTIFICATION: 1036  RESPONSE: Per Dr. Benay Spice: Counts are stable. No need to transfuse. F/U as scheduled.

## 2020-06-04 NOTE — Telephone Encounter (Signed)
CRITICAL VALUE STICKER  CRITICAL VALUE: WBC 0.5  RECEIVER (on-site recipient of call): Georgina Pillion, RN  DATE & TIME NOTIFIED: 06/04/2020;0958  MESSENGER (representative from lab):Lelan Pons  MD NOTIFIED: Dr. Benay Spice via Manuela Schwartz, [place]  RESPONSE: Acknowledged - will inform MD

## 2020-06-08 ENCOUNTER — Encounter: Admit: 2020-06-08 | Discharge: 2020-06-08 | Payer: PRIVATE HEALTH INSURANCE

## 2020-06-08 ENCOUNTER — Encounter
Admit: 2020-06-08 | Discharge: 2020-06-08 | Payer: PRIVATE HEALTH INSURANCE | Attending: Nurse Practitioner | Primary: Nurse Practitioner

## 2020-06-08 DIAGNOSIS — D61818 Other pancytopenia: Secondary | ICD-10-CM | POA: Diagnosis not present

## 2020-06-08 DIAGNOSIS — C9110 Chronic lymphocytic leukemia of B-cell type not having achieved remission: Secondary | ICD-10-CM | POA: Diagnosis not present

## 2020-06-08 DIAGNOSIS — R161 Splenomegaly, not elsewhere classified: Secondary | ICD-10-CM | POA: Diagnosis not present

## 2020-06-08 DIAGNOSIS — Z9484 Stem cells transplant status: Secondary | ICD-10-CM | POA: Diagnosis not present

## 2020-06-08 DIAGNOSIS — Z9221 Personal history of antineoplastic chemotherapy: Secondary | ICD-10-CM | POA: Diagnosis not present

## 2020-06-08 DIAGNOSIS — Q998 Other specified chromosome abnormalities: Secondary | ICD-10-CM | POA: Diagnosis not present

## 2020-06-08 DIAGNOSIS — D801 Nonfamilial hypogammaglobulinemia: Secondary | ICD-10-CM | POA: Diagnosis not present

## 2020-06-08 DIAGNOSIS — E039 Hypothyroidism, unspecified: Secondary | ICD-10-CM | POA: Diagnosis not present

## 2020-06-08 DIAGNOSIS — D84822 Immunocompromised state associated with stem cell transplant (CMS-HCC): Principal | ICD-10-CM

## 2020-06-08 DIAGNOSIS — Z79899 Other long term (current) drug therapy: Secondary | ICD-10-CM | POA: Diagnosis not present

## 2020-06-08 DIAGNOSIS — Z79890 Hormone replacement therapy (postmenopausal): Secondary | ICD-10-CM | POA: Diagnosis not present

## 2020-06-08 DIAGNOSIS — N179 Acute kidney failure, unspecified: Principal | ICD-10-CM

## 2020-06-08 DIAGNOSIS — M8580 Other specified disorders of bone density and structure, unspecified site: Principal | ICD-10-CM

## 2020-06-08 MED FILL — CEFDINIR 300 MG CAPSULE: ORAL | 10 days supply | Qty: 20 | Fill #5

## 2020-06-08 MED FILL — CEFDINIR 300 MG CAPSULE: 10 days supply | Qty: 20 | Fill #5 | Status: AC

## 2020-06-08 MED FILL — POSACONAZOLE 100 MG TABLET,DELAYED RELEASE: ORAL | 30 days supply | Qty: 90 | Fill #1

## 2020-06-08 MED FILL — POSACONAZOLE 100 MG TABLET,DELAYED RELEASE: 30 days supply | Qty: 90 | Fill #1 | Status: AC

## 2020-06-11 ENCOUNTER — Inpatient Hospital Stay: Payer: BC Managed Care – PPO

## 2020-06-11 ENCOUNTER — Inpatient Hospital Stay: Payer: BC Managed Care – PPO | Admitting: Nurse Practitioner

## 2020-06-14 ENCOUNTER — Encounter: Admit: 2020-06-14 | Discharge: 2020-06-15 | Payer: PRIVATE HEALTH INSURANCE

## 2020-06-14 DIAGNOSIS — D849 Immunodeficiency, unspecified: Secondary | ICD-10-CM | POA: Diagnosis not present

## 2020-06-14 DIAGNOSIS — C9111 Chronic lymphocytic leukemia of B-cell type in remission: Secondary | ICD-10-CM | POA: Diagnosis not present

## 2020-06-14 DIAGNOSIS — G4700 Insomnia, unspecified: Secondary | ICD-10-CM | POA: Diagnosis not present

## 2020-06-14 DIAGNOSIS — D709 Neutropenia, unspecified: Secondary | ICD-10-CM | POA: Diagnosis not present

## 2020-06-14 DIAGNOSIS — D619 Aplastic anemia, unspecified: Secondary | ICD-10-CM | POA: Diagnosis not present

## 2020-06-14 DIAGNOSIS — C9110 Chronic lymphocytic leukemia of B-cell type not having achieved remission: Secondary | ICD-10-CM | POA: Diagnosis not present

## 2020-06-14 DIAGNOSIS — Z9484 Stem cells transplant status: Secondary | ICD-10-CM | POA: Diagnosis not present

## 2020-06-14 DIAGNOSIS — D801 Nonfamilial hypogammaglobulinemia: Secondary | ICD-10-CM | POA: Diagnosis not present

## 2020-06-14 DIAGNOSIS — Z9221 Personal history of antineoplastic chemotherapy: Secondary | ICD-10-CM | POA: Diagnosis not present

## 2020-06-14 DIAGNOSIS — Z681 Body mass index (BMI) 19 or less, adult: Secondary | ICD-10-CM | POA: Diagnosis not present

## 2020-06-14 DIAGNOSIS — R110 Nausea: Secondary | ICD-10-CM | POA: Diagnosis not present

## 2020-06-14 DIAGNOSIS — Z79899 Other long term (current) drug therapy: Secondary | ICD-10-CM | POA: Diagnosis not present

## 2020-06-14 DIAGNOSIS — Z48290 Encounter for aftercare following bone marrow transplant: Secondary | ICD-10-CM | POA: Diagnosis not present

## 2020-06-14 DIAGNOSIS — B1910 Unspecified viral hepatitis B without hepatic coma: Secondary | ICD-10-CM | POA: Diagnosis not present

## 2020-06-14 DIAGNOSIS — E039 Hypothyroidism, unspecified: Secondary | ICD-10-CM | POA: Diagnosis not present

## 2020-06-14 DIAGNOSIS — Z7682 Awaiting organ transplant status: Principal | ICD-10-CM

## 2020-06-14 DIAGNOSIS — Z9481 Bone marrow transplant status: Principal | ICD-10-CM

## 2020-06-14 MED ORDER — PREDNISONE 20 MG TABLET
ORAL_TABLET | Freq: Every day | ORAL | 0 refills | 30.00000 days | Status: CP
Start: 2020-06-14 — End: 2020-06-25
  Filled 2020-06-14: qty 90, 30d supply, fill #0

## 2020-06-14 MED ORDER — TACROLIMUS 0.5 MG CAPSULE, IMMEDIATE-RELEASE
ORAL_CAPSULE | ORAL | 5 refills | 30 days | Status: CP
Start: 2020-06-14 — End: 2020-06-15

## 2020-06-14 MED FILL — PREDNISONE 20 MG TABLET: 30 days supply | Qty: 90 | Fill #0 | Status: AC

## 2020-06-15 ENCOUNTER — Telehealth: Payer: Self-pay | Admitting: *Deleted

## 2020-06-15 DIAGNOSIS — D619 Aplastic anemia, unspecified: Principal | ICD-10-CM

## 2020-06-15 DIAGNOSIS — Z9484 Stem cells transplant status: Principal | ICD-10-CM

## 2020-06-15 MED ORDER — TACROLIMUS 0.5 MG CAPSULE, IMMEDIATE-RELEASE
ORAL_CAPSULE | ORAL | 5 refills | 30 days | Status: CP
Start: 2020-06-15 — End: 2020-06-25

## 2020-06-15 NOTE — Telephone Encounter (Signed)
Called to request lab/OV on 11/29 or 11/30. Was at [facility] yesterday and returns on 06/25/20.

## 2020-06-16 ENCOUNTER — Telehealth: Payer: Self-pay | Admitting: Oncology

## 2020-06-16 NOTE — Telephone Encounter (Signed)
Scheduled appointments per 11/24 sch msg. Spoke to patient who is aware of appointment date and time.

## 2020-06-17 DIAGNOSIS — D619 Aplastic anemia, unspecified: Secondary | ICD-10-CM | POA: Diagnosis not present

## 2020-06-17 DIAGNOSIS — D801 Nonfamilial hypogammaglobulinemia: Secondary | ICD-10-CM | POA: Diagnosis not present

## 2020-06-17 DIAGNOSIS — C9110 Chronic lymphocytic leukemia of B-cell type not having achieved remission: Secondary | ICD-10-CM | POA: Diagnosis not present

## 2020-06-22 ENCOUNTER — Telehealth: Payer: Self-pay | Admitting: Oncology

## 2020-06-22 ENCOUNTER — Inpatient Hospital Stay: Payer: BC Managed Care – PPO

## 2020-06-22 ENCOUNTER — Inpatient Hospital Stay (HOSPITAL_BASED_OUTPATIENT_CLINIC_OR_DEPARTMENT_OTHER): Payer: BC Managed Care – PPO | Admitting: Oncology

## 2020-06-22 ENCOUNTER — Other Ambulatory Visit: Payer: Self-pay

## 2020-06-22 VITALS — BP 130/80 | HR 92 | Temp 97.3°F | Resp 17 | Ht 66.0 in | Wt 112.8 lb

## 2020-06-22 DIAGNOSIS — C9110 Chronic lymphocytic leukemia of B-cell type not having achieved remission: Secondary | ICD-10-CM

## 2020-06-22 DIAGNOSIS — E039 Hypothyroidism, unspecified: Secondary | ICD-10-CM | POA: Diagnosis not present

## 2020-06-22 DIAGNOSIS — Z95828 Presence of other vascular implants and grafts: Secondary | ICD-10-CM

## 2020-06-22 DIAGNOSIS — Z452 Encounter for adjustment and management of vascular access device: Secondary | ICD-10-CM

## 2020-06-22 DIAGNOSIS — Z79899 Other long term (current) drug therapy: Secondary | ICD-10-CM | POA: Diagnosis not present

## 2020-06-22 DIAGNOSIS — D619 Aplastic anemia, unspecified: Secondary | ICD-10-CM | POA: Diagnosis not present

## 2020-06-22 DIAGNOSIS — D696 Thrombocytopenia, unspecified: Secondary | ICD-10-CM

## 2020-06-22 DIAGNOSIS — D801 Nonfamilial hypogammaglobulinemia: Secondary | ICD-10-CM | POA: Diagnosis not present

## 2020-06-22 LAB — SAMPLE TO BLOOD BANK

## 2020-06-22 LAB — CBC WITH DIFFERENTIAL (CANCER CENTER ONLY)
Abs Immature Granulocytes: 0.12 10*3/uL — ABNORMAL HIGH (ref 0.00–0.07)
Basophils Absolute: 0 10*3/uL (ref 0.0–0.1)
Basophils Relative: 0 %
Eosinophils Absolute: 0 10*3/uL (ref 0.0–0.5)
Eosinophils Relative: 1 %
HCT: 23.5 % — ABNORMAL LOW (ref 36.0–46.0)
Hemoglobin: 8.2 g/dL — ABNORMAL LOW (ref 12.0–15.0)
Immature Granulocytes: 13 %
Lymphocytes Relative: 26 %
Lymphs Abs: 0.2 10*3/uL — ABNORMAL LOW (ref 0.7–4.0)
MCH: 28.9 pg (ref 26.0–34.0)
MCHC: 34.9 g/dL (ref 30.0–36.0)
MCV: 82.7 fL (ref 80.0–100.0)
Monocytes Absolute: 0.2 10*3/uL (ref 0.1–1.0)
Monocytes Relative: 22 %
Neutro Abs: 0.3 10*3/uL — CL (ref 1.7–7.7)
Neutrophils Relative %: 38 %
Platelet Count: 17 10*3/uL — ABNORMAL LOW (ref 150–400)
RBC: 2.84 MIL/uL — ABNORMAL LOW (ref 3.87–5.11)
RDW: 14.6 % (ref 11.5–15.5)
WBC Count: 0.9 10*3/uL — CL (ref 4.0–10.5)
nRBC: 0 % (ref 0.0–0.2)

## 2020-06-22 MED ORDER — HEPARIN SOD (PORK) LOCK FLUSH 100 UNIT/ML IV SOLN
250.0000 [IU] | Freq: Once | INTRAVENOUS | Status: AC
  Administered 2020-06-22: 250 [IU]
  Filled 2020-06-22: qty 5

## 2020-06-22 MED ORDER — SODIUM CHLORIDE 0.9% FLUSH
10.0000 mL | INTRAVENOUS | Status: DC | PRN
  Administered 2020-06-22: 10 mL
  Filled 2020-06-22: qty 10

## 2020-06-22 NOTE — Progress Notes (Signed)
[place] OFFICE PROGRESS NOTE   Diagnosis: CLL, aplastic anemia  INTERVAL HISTORY:   Meagan Walsh returns as scheduled.  She feels well.  No fever, bleeding, or diarrhea.  She continues antibiotic prophylaxis and immunosuppression.  She was last transfused with packed red blood cells at [facility] on 06/14/2020.  She is scheduled to see Meagan Walsh on 06/25/2020.  Objective:  Vital signs in last 24 hours:  Blood pressure 130/80, pulse 92, temperature (!) 97.3 F (36.3 C), temperature source Tympanic, resp. rate 17, height '5\' 6"'  (1.676 m), weight 112 lb 12.8 oz (51.2 kg), SpO2 100 %.    HEENT: Few petechiae, no active bleeding, no thrush Resp: Fine end inspiratory rales at the right upper posterior chest, no respiratory distress Cardio: Regular rate and rhythm GI: No hepatosplenomegaly, nontender Vascular: No leg edema  Skin: Small ecchymoses at the bilateral upper back (areas where she says she has been scratching), dry skin over the back  Portacath/PICC-without erythema  Lab Results:  Lab Results  Component Value Date   WBC 0.9 (LL) 06/22/2020   HGB 8.2 (L) 06/22/2020   HCT 23.5 (L) 06/22/2020   MCV 82.7 06/22/2020   PLT 17 (L) 06/22/2020   NEUTROABS 0.3 (LL) 06/22/2020    CMP  Lab Results  Component Value Date   NA 144 12/17/2019   K 3.6 12/17/2019   CL 111 12/17/2019   CO2 26 12/17/2019   GLUCOSE 149 (H) 12/17/2019   BUN 18 12/17/2019   CREATININE 0.83 12/17/2019   CALCIUM 9.1 12/17/2019   PROT 5.5 (L) 12/17/2019   ALBUMIN 3.7 12/17/2019   AST 14 (L) 12/17/2019   ALT 28 12/17/2019   ALKPHOS 111 12/17/2019   BILITOT 0.5 12/17/2019   GFRNONAA >60 12/17/2019   GFRAA >60 12/17/2019     Medications: I have reviewed the patient's current medications.   Assessment/Plan:  CLL-diagnosed in August 2010, flow cytometry consistent with CLL Enlarged leftinguinal lymph node January 2019,smallneck/axillary nodes and palpable splenomegaly  09/11/2017  CTson 09/17/2017-3 cm necrotic appearing lymph node in the left inguinal region, borderline enlarged pelvic/retroperitoneal, chest, and axillary nodes. Mild splenomegaly.  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, no evidence of 11q22deletion  Bone marrow biopsy 02/26/2018-hypercellular marrow with extensive involvement by CLL, lymphocytes represent85% of all cells  Ibrutinib initiated 04/03/2018  Ibrutinib placed on hold 04/11/2018 due to onset of arthralgias  Ibrutinib resumed 04/16/2018, discontinued 04/25/2018 secondary to severe arthralgias/arthritis  Ibrutinib resumed at a dose of 140 mg daily 05/03/2018  Ibrutinib dose adjusted to 140 mg alternating with 260m 06/25/2018  Ibrutinib discontinued 07/03/2018 secondary to severe arthralgias  Acalabrutinib 08/16/2018, discontinued 11/15/2018 secondary to persistent severe transfusion dependent anemia and neutropenia/thrombocytopenia, last dose 11/14/2018  Bone marrow biopsy 11/21/2018-decreased cellularity, involvement by CLL, decreased erythroid and granulocytic precursors, decreased megakaryocytes  Cycle 1 rituximab 12/06/2018  Bone marrow biopsy 12/24/2018 at [facility]-hypocellular bone marrow (10%) involved by CLL, representing 50% of marrow cellularity; markedly decreased trilineage hematopoiesis including essentially absent erythropoiesis  Bone marrow biopsy 03/06/2027 [facility]-hypocellular  bone marrow (20%) with marked reduction of maturing hematopoietic  elements; numerous lymphoid aggregates consistent with involvement by CLL, representing approximately 50% of marrow cellularity by flow cytometry  ATG/cyclosporine at [facility] beginning 03/19/2019 followed by prednisone taper  Eltrombopag beginning 07/05/2019  Cyclosporine discontinued 07/08/2019  Tacrolimus 07/10/2019, discontinued 09/24/2019  Bone marrow biopsy 10/07/2019 at [facility] bone marrow sampling (<5% cellular) essentially devoid of hematopoietic elements; Persistent chronic lymphocytic leukemia, representing 23% of marrow cellularity by flow cytometric analysis; Routine cytogenetic analysis is pending.  CT CAP 10/10/2019 showed no acute process or evidence of active lymphoma/leukemia within the chest, abdomen, or pelvis. Resection or resolution of previously described necrotic left inguinal node  Tacrolimus resumed 10/17/2019, discontinued on 10/28/2019 and resumed at a dose of 0.5 mg twice daily on 10/29/2019, tacrolimus escalated to 1 mg twice daily 11/14/2019  Bone marrow biopsy at [facility] 12/26/1998 21-20% cellular marrow, CLL involving approximately 20% of the cellular component  Eltrombopag discontinued week of 12/29/2019  01/01/2020 -induction chemotherapy with bendamustine, fludarabine, rituximab, tacrolimus started day -3 and methotrexate given on day +1, +3, +6, and +11  Bone marrow biopsy 05/05/2020-30% cellular marrow with adequate megakaryocytes, increased erythropoiesis and diminished granulopoiesis, 3-5% blasts, cells are 94% donor origin  2.Hypothyroidism 3.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 Augmentin prescribed. 6.Anemia secondary to CLL-DAT negative, bilirubin and LDH  normal June 2019, progressive symptomatic anemia 04/01/2018, red cell transfusions 04/01/2018,followed by multiple additional red cell transfusions 7.Hypogammaglobulinemia 8. Pancytopenia secondary to CLL and a hypocellular bone marrow  G-CSF and Nplate started 7/89/3810, G-CSF changed to daily beginning 12/17/2018; G-CSF discontinued 12/31/2018, last Nplate 01/15/2019  Began prednisone 60 mg daily 01/11/19, tolerating moderately well except jitteriness, irritability, and difficulty sleeping. Tapered to 40 mg daily x1 week starting 01/24/19, reduce by 10 mg each week until discontinued. Prednisone discontinued 02/20/2019.  Promacta started 01/20/2019, dose increased to 100 mg daily 02/04/2019; dose increased to 150 mg daily 02/21/2019  IVIG daily for 2 days beginning 02/25/2019  9. 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 No lymphadenopathy or splenomegaly. Exjade beginning 06/28/2019. 13. Elevated creatinine-cyclosporine toxicity?Elevated 10/28/2019 with hyperkalemia-tacrolimus induced? 14.Vaginal bleeding beginning 07/13/2019-Mirena device removed, uterine fibroids and possible endometrial fluid or thickening in the fundus per CT CAP on 10/10/19 15.Elevated lithium level 08/08/2019, lithium toxicity?-Lithium held 08/08/2019-08/12/2019, then resumed at a lower dose 16. Deconditioning, secondary to n/v/d 17. Orthostatic hypotension 09/15/19,received500 cc NS in clinic     Disposition: Ms. Deroy appears stable.  She has persistent severe pancytopenia.  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  She continues close follow-up with the transplant team at [facility] for post transplant care.  She will meet with Meagan Walsh later this week to discuss plans for  infusion of additional donor cells versus other treatment options.  We will continue to provide transfusion support as needed.  She will return for an office visit on 07/02/2020.  She will contact us sooner for symptoms of anemia.  Meagan Coder, MD  06/22/2020  1:41 PM

## 2020-06-22 NOTE — Telephone Encounter (Signed)
Scheduled appointments per 11/30 los. Spoke to patient who is aware of appointments dates and times.  °

## 2020-06-25 ENCOUNTER — Encounter: Admit: 2020-06-25 | Discharge: 2020-06-26 | Payer: PRIVATE HEALTH INSURANCE

## 2020-06-25 DIAGNOSIS — D801 Nonfamilial hypogammaglobulinemia: Secondary | ICD-10-CM | POA: Diagnosis not present

## 2020-06-25 DIAGNOSIS — C9110 Chronic lymphocytic leukemia of B-cell type not having achieved remission: Secondary | ICD-10-CM | POA: Diagnosis not present

## 2020-06-25 DIAGNOSIS — Z9484 Stem cells transplant status: Secondary | ICD-10-CM | POA: Diagnosis not present

## 2020-06-25 DIAGNOSIS — D849 Immunodeficiency, unspecified: Secondary | ICD-10-CM | POA: Diagnosis not present

## 2020-06-25 DIAGNOSIS — D708 Other neutropenia: Secondary | ICD-10-CM | POA: Diagnosis not present

## 2020-06-25 DIAGNOSIS — D61818 Other pancytopenia: Secondary | ICD-10-CM | POA: Diagnosis not present

## 2020-06-25 DIAGNOSIS — Z9221 Personal history of antineoplastic chemotherapy: Secondary | ICD-10-CM | POA: Diagnosis not present

## 2020-06-25 DIAGNOSIS — Z792 Long term (current) use of antibiotics: Secondary | ICD-10-CM | POA: Diagnosis not present

## 2020-06-25 DIAGNOSIS — Z79899 Other long term (current) drug therapy: Secondary | ICD-10-CM | POA: Diagnosis not present

## 2020-06-25 DIAGNOSIS — E039 Hypothyroidism, unspecified: Secondary | ICD-10-CM | POA: Diagnosis not present

## 2020-06-25 DIAGNOSIS — Z524 Kidney donor: Secondary | ICD-10-CM | POA: Diagnosis not present

## 2020-06-25 DIAGNOSIS — B1910 Unspecified viral hepatitis B without hepatic coma: Secondary | ICD-10-CM | POA: Diagnosis not present

## 2020-06-25 DIAGNOSIS — Z3682 Encounter for antenatal screening for nuchal translucency: Secondary | ICD-10-CM | POA: Diagnosis not present

## 2020-06-25 DIAGNOSIS — Z7952 Long term (current) use of systemic steroids: Secondary | ICD-10-CM | POA: Diagnosis not present

## 2020-06-25 DIAGNOSIS — N179 Acute kidney failure, unspecified: Secondary | ICD-10-CM | POA: Diagnosis not present

## 2020-06-25 DIAGNOSIS — Z79890 Hormone replacement therapy (postmenopausal): Secondary | ICD-10-CM | POA: Diagnosis not present

## 2020-06-25 DIAGNOSIS — D619 Aplastic anemia, unspecified: Principal | ICD-10-CM

## 2020-06-25 MED ORDER — TACROLIMUS 0.5 MG CAPSULE, IMMEDIATE-RELEASE
ORAL_CAPSULE | Freq: Two times a day (BID) | ORAL | 5 refills | 30.00000 days | Status: CP
Start: 2020-06-25 — End: 2020-08-04
  Filled 2020-06-29: qty 180, 30d supply, fill #0

## 2020-06-25 MED ORDER — PREDNISONE 20 MG TABLET
ORAL_TABLET | Freq: Every day | ORAL | 0 refills | 30.00000 days | Status: CP
Start: 2020-06-25 — End: 2020-07-14

## 2020-06-29 ENCOUNTER — Inpatient Hospital Stay: Payer: BC Managed Care – PPO | Attending: Nurse Practitioner

## 2020-06-29 ENCOUNTER — Inpatient Hospital Stay: Payer: BC Managed Care – PPO

## 2020-06-29 ENCOUNTER — Other Ambulatory Visit: Payer: Self-pay

## 2020-06-29 ENCOUNTER — Telehealth: Payer: Self-pay | Admitting: *Deleted

## 2020-06-29 ENCOUNTER — Encounter: Payer: Self-pay | Admitting: *Deleted

## 2020-06-29 ENCOUNTER — Other Ambulatory Visit: Payer: Self-pay | Admitting: *Deleted

## 2020-06-29 VITALS — BP 145/82 | HR 87 | Temp 97.7°F | Resp 17

## 2020-06-29 DIAGNOSIS — D619 Aplastic anemia, unspecified: Secondary | ICD-10-CM | POA: Insufficient documentation

## 2020-06-29 DIAGNOSIS — Z95828 Presence of other vascular implants and grafts: Secondary | ICD-10-CM

## 2020-06-29 DIAGNOSIS — C9110 Chronic lymphocytic leukemia of B-cell type not having achieved remission: Secondary | ICD-10-CM

## 2020-06-29 DIAGNOSIS — D649 Anemia, unspecified: Secondary | ICD-10-CM

## 2020-06-29 DIAGNOSIS — E039 Hypothyroidism, unspecified: Secondary | ICD-10-CM | POA: Diagnosis not present

## 2020-06-29 DIAGNOSIS — D696 Thrombocytopenia, unspecified: Secondary | ICD-10-CM

## 2020-06-29 DIAGNOSIS — Z452 Encounter for adjustment and management of vascular access device: Secondary | ICD-10-CM

## 2020-06-29 LAB — CBC WITH DIFFERENTIAL (CANCER CENTER ONLY)
Abs Immature Granulocytes: 0 10*3/uL (ref 0.00–0.07)
Basophils Absolute: 0 10*3/uL (ref 0.0–0.1)
Basophils Relative: 0 %
Eosinophils Absolute: 0 10*3/uL (ref 0.0–0.5)
Eosinophils Relative: 2 %
HCT: 17.6 % — ABNORMAL LOW (ref 36.0–46.0)
Hemoglobin: 6.1 g/dL — CL (ref 12.0–15.0)
Immature Granulocytes: 0 %
Lymphocytes Relative: 20 %
Lymphs Abs: 0.1 10*3/uL — ABNORMAL LOW (ref 0.7–4.0)
MCH: 29.5 pg (ref 26.0–34.0)
MCHC: 34.7 g/dL (ref 30.0–36.0)
MCV: 85 fL (ref 80.0–100.0)
Monocytes Absolute: 0 10*3/uL — ABNORMAL LOW (ref 0.1–1.0)
Monocytes Relative: 6 %
Neutro Abs: 0.4 10*3/uL — CL (ref 1.7–7.7)
Neutrophils Relative %: 72 %
Platelet Count: 11 10*3/uL — ABNORMAL LOW (ref 150–400)
RBC: 2.07 MIL/uL — ABNORMAL LOW (ref 3.87–5.11)
RDW: 15.3 % (ref 11.5–15.5)
WBC Count: 0.5 10*3/uL — CL (ref 4.0–10.5)
nRBC: 0 % (ref 0.0–0.2)

## 2020-06-29 LAB — PREPARE RBC (CROSSMATCH)

## 2020-06-29 MED ORDER — SODIUM CHLORIDE 0.9% FLUSH
10.0000 mL | INTRAVENOUS | Status: DC | PRN
  Administered 2020-06-29: 10 mL
  Filled 2020-06-29: qty 10

## 2020-06-29 MED ORDER — HEPARIN SOD (PORK) LOCK FLUSH 100 UNIT/ML IV SOLN
250.0000 [IU] | Freq: Once | INTRAVENOUS | Status: AC
  Administered 2020-06-29: 250 [IU]
  Filled 2020-06-29: qty 5

## 2020-06-29 MED FILL — TACROLIMUS 0.5 MG CAPSULE, IMMEDIATE-RELEASE: 30 days supply | Qty: 180 | Fill #0 | Status: AC

## 2020-06-29 NOTE — Progress Notes (Signed)
Patient left message that she was feeling very symptomatic and asking for transfusion tomorrow or Thursday. HP message sent to scheduler w/request and for labs to be collected today.

## 2020-06-29 NOTE — Telephone Encounter (Signed)
Left VM requesting lab/transfusion this week--12/8 or 12/9. Reports she is very symptomatic. [facility] all [facility]. High priority scheduling message sent for appointments.

## 2020-06-29 NOTE — Progress Notes (Signed)
CRITICAL VALUE STICKER  CRITICAL VALUE: WBC 0.5 and Hgb 6.1  RECEIVER (on-site recipient of call): Amirra Herling,RN   DATE & TIME NOTIFIED: 06/29/20 @ 1544  MESSENGER (representative from lab): Hillary  MD NOTIFIED: Dr. Benay Spice  TIME OF NOTIFICATION: 1550  RESPONSE: 2 units blood on 12/8. Will recheck labs at 07/02/20 visit.

## 2020-06-29 NOTE — Patient Instructions (Signed)
PICC Home Care Guide  A peripherally inserted central catheter (PICC) is a form of IV access that allows medicines and IV fluids to be quickly distributed throughout the body. The PICC is a long, thin, flexible tube (catheter) that is inserted into a vein in the upper arm. The catheter ends in a large vein in the chest (superior vena cava, or SVC). After the PICC is inserted, a chest X-ray may be done to make sure that it is in the correct place. A PICC may be placed for different reasons, such as:  To give medicines and liquid nutrition.  To give IV fluids and blood products.  If there is trouble placing a peripheral intravenous (PIV) catheter. If taken care of properly, a PICC can remain in place for several months. Having a PICC can also allow a person to go home from the hospital sooner. Medicine and PICC care can be managed at home by a family member, caregiver, or home health care team. What are the risks? Generally, having a PICC is safe. However, problems may occur, including:  A blood clot (thrombus) forming in or at the tip of the PICC.  A blood clot forming in a vein (deep vein thrombosis) or traveling to the lung (pulmonary embolism).  Inflammation of the vein (phlebitis) in which the PICC is placed.  Infection. Central line associated blood stream infection (CLABSI) is a serious infection that often requires hospitalization.  PICC movement (malposition). The PICC tip may move from its original position due to excessive physical activity, forceful coughing, sneezing, or vomiting.  A break or cut in the PICC. It is important not to use scissors near the PICC.  Nerve or tendon irritation or injury during PICC insertion. How to take care of your PICC Preventing problems  You and any caregivers should wash your hands often with soap. Wash hands: ? Before touching the PICC line or the infusion device. ? Before changing a bandage (dressing).  Flush the PICC as told by your  health care provider. Let your health care provider know right away if the PICC is hard to flush or does not flush. Do not use force to flush the PICC.  Do not use a syringe that is less than 10 mL to flush the PICC.  Avoid blood pressure checks on the arm in which the PICC is placed.  Never pull or tug on the PICC.  Do not take the PICC out yourself. Only a trained clinical professional should remove the PICC.  Use clean and sterile supplies only. Keep the supplies in a dry place. Do not reuse needles, syringes, or any other supplies. Doing that can lead to infection.  Keep pets and children away from your PICC line.  Check the PICC insertion site every day for signs of infection. Check for: ? Leakage. ? Redness, swelling, or pain. ? Fluid or blood. ? Warmth. ? Pus or a bad smell. PICC dressing care  Keep your PICC bandage (dressing) clean and dry to prevent infection.  Do not take baths, swim, or use a hot tub until your health care provider approves. Ask your health care provider if you can take showers. You may only be allowed to take sponge baths for bathing. When you are allowed to shower: ? Ask your health care provider to teach you how to wrap the PICC line. ? Cover the PICC line with clear plastic wrap and tape to keep it dry while showering.  Follow instructions from your health care provider   about how to take care of your insertion site and dressing. Make sure you: ? Wash your hands with soap and water before you change your bandage (dressing). If soap and water are not available, use hand sanitizer. ? Change your dressing as told by your health care provider. ? Leave stitches (sutures), skin glue, or adhesive strips in place. These skin closures may need to stay in place for 2 weeks or longer. If adhesive strip edges start to loosen and curl up, you may trim the loose edges. Do not remove adhesive strips completely unless your health care provider tells you to do  that.  Change your PICC dressing if it becomes loose or wet. General instructions   Carry your PICC identification card or wear a medical alert bracelet at all times.  Keep the tube clamped at all times, unless it is being used.  Carry a smooth-edge clamp with you at all times to place on the tube if it breaks.  Do not use scissors or sharp objects near the tube.  You may bend your arm and move it freely. If your PICC is near or at the bend of your elbow, avoid activity with repeated motion at the elbow.  Avoid lifting heavy objects as told by your health care provider.  Keep all follow-up visits as told by your health care provider. This is important. Disposal of supplies  Throw away any syringes in a disposal container that is meant for sharp items (sharps container). You can buy a sharps container from a pharmacy, or you can make one by using an empty hard plastic bottle with a cover.  Place any used dressings or infusion bags into a plastic bag. Throw that bag in the trash. Contact a health care provider if:  You have pain in your arm, ear, face, or teeth.  You have a fever or chills.  You have redness, swelling, or pain around the insertion site.  You have fluid or blood coming from the insertion site.  Your insertion site feels warm to the touch.  You have pus or a bad smell coming from the insertion site.  Your skin feels hard and raised around the insertion site. Get help right away if:  Your PICC is accidentally pulled all the way out. If this happens, cover the insertion site with a bandage or gauze dressing. Do not throw the PICC away. Your health care provider will need to check it.  Your PICC was tugged or pulled and has partially come out. Do not  push the PICC back in.  You cannot flush the PICC, it is hard to flush, or the PICC leaks around the insertion site when it is flushed.  You hear a "flushing" sound when the PICC is flushed.  You feel your  heart racing or skipping beats.  There is a hole or tear in the PICC.  You have swelling in the arm in which the PICC was inserted.  You have a red streak going up your arm from where the PICC was inserted. Summary  A peripherally inserted central catheter (PICC) is a long, thin, flexible tube (catheter) that is inserted into a vein in the upper arm.  The PICC is inserted using a sterile technique by a specially trained nurse or physician. Only a trained clinical professional should remove it.  Keep your PICC identification card with you at all times.  Avoid blood pressure checks on the arm in which the PICC is placed.  If cared for   properly, a PICC can remain in place for several months. Having a PICC can also allow a person to go home from the hospital sooner. This information is not intended to replace advice given to you by your health care provider. Make sure you discuss any questions you have with your health care provider. Document Revised: 06/22/2017 Document Reviewed: 08/12/2016 Elsevier Patient Education  2020 [employer]  

## 2020-06-30 ENCOUNTER — Inpatient Hospital Stay: Payer: BC Managed Care – PPO

## 2020-06-30 ENCOUNTER — Other Ambulatory Visit: Payer: Self-pay

## 2020-06-30 DIAGNOSIS — C9110 Chronic lymphocytic leukemia of B-cell type not having achieved remission: Secondary | ICD-10-CM

## 2020-06-30 DIAGNOSIS — D649 Anemia, unspecified: Secondary | ICD-10-CM

## 2020-06-30 MED ORDER — SODIUM CHLORIDE 0.9% FLUSH
10.0000 mL | INTRAVENOUS | Status: AC | PRN
  Administered 2020-06-30: 10 mL
  Filled 2020-06-30: qty 10

## 2020-06-30 MED ORDER — SODIUM CHLORIDE 0.9% IV SOLUTION
250.0000 mL | Freq: Once | INTRAVENOUS | Status: AC
  Administered 2020-06-30: 250 mL via INTRAVENOUS
  Filled 2020-06-30: qty 250

## 2020-06-30 MED ORDER — HEPARIN SOD (PORK) LOCK FLUSH 100 UNIT/ML IV SOLN
250.0000 [IU] | INTRAVENOUS | Status: AC | PRN
  Administered 2020-06-30: 500 [IU]
  Filled 2020-06-30: qty 5

## 2020-06-30 NOTE — Patient Instructions (Signed)

## 2020-06-30 NOTE — Progress Notes (Signed)
Pt tolerated transfusion 2 units PRBCs well today, reports feeling better at end of tx.  Ambulatory with belongings to exit, steady gait, driving self home, stable at time of d/c.

## 2020-07-01 ENCOUNTER — Encounter: Admit: 2020-07-01 | Discharge: 2020-07-01 | Payer: PRIVATE HEALTH INSURANCE

## 2020-07-01 DIAGNOSIS — Z9484 Stem cells transplant status: Secondary | ICD-10-CM | POA: Diagnosis not present

## 2020-07-01 DIAGNOSIS — Z005 Encounter for examination of potential donor of organ and tissue: Secondary | ICD-10-CM | POA: Diagnosis not present

## 2020-07-01 DIAGNOSIS — C9110 Chronic lymphocytic leukemia of B-cell type not having achieved remission: Secondary | ICD-10-CM | POA: Diagnosis not present

## 2020-07-01 DIAGNOSIS — R112 Nausea with vomiting, unspecified: Principal | ICD-10-CM

## 2020-07-01 DIAGNOSIS — T50905A Adverse effect of unspecified drugs, medicaments and biological substances, initial encounter: Principal | ICD-10-CM

## 2020-07-01 DIAGNOSIS — Z7682 Awaiting organ transplant status: Principal | ICD-10-CM

## 2020-07-01 LAB — TYPE AND SCREEN
ABO/RH(D): O NEG
Antibody Screen: NEGATIVE
Unit division: 0
Unit division: 0

## 2020-07-01 LAB — BPAM RBC
Blood Product Expiration Date: 202112202359
Blood Product Expiration Date: 202201042359
ISSUE DATE / TIME: 202112080813
ISSUE DATE / TIME: 202112080813
Unit Type and Rh: 9500
Unit Type and Rh: 9500

## 2020-07-01 MED ORDER — ONDANSETRON HCL 8 MG TABLET
ORAL_TABLET | Freq: Two times a day (BID) | ORAL | 2 refills | 15 days | Status: CP | PRN
Start: 2020-07-01 — End: 2020-09-03

## 2020-07-02 ENCOUNTER — Telehealth: Payer: Self-pay

## 2020-07-02 ENCOUNTER — Inpatient Hospital Stay: Payer: BC Managed Care – PPO

## 2020-07-02 ENCOUNTER — Other Ambulatory Visit: Payer: Self-pay

## 2020-07-02 ENCOUNTER — Inpatient Hospital Stay (HOSPITAL_BASED_OUTPATIENT_CLINIC_OR_DEPARTMENT_OTHER): Payer: BC Managed Care – PPO | Admitting: Nurse Practitioner

## 2020-07-02 ENCOUNTER — Encounter: Payer: Self-pay | Admitting: Nurse Practitioner

## 2020-07-02 VITALS — BP 143/92 | HR 79 | Temp 98.3°F | Resp 18 | Ht 66.0 in | Wt 111.6 lb

## 2020-07-02 DIAGNOSIS — D696 Thrombocytopenia, unspecified: Secondary | ICD-10-CM

## 2020-07-02 DIAGNOSIS — Z452 Encounter for adjustment and management of vascular access device: Secondary | ICD-10-CM

## 2020-07-02 DIAGNOSIS — C9110 Chronic lymphocytic leukemia of B-cell type not having achieved remission: Secondary | ICD-10-CM

## 2020-07-02 DIAGNOSIS — Z95828 Presence of other vascular implants and grafts: Secondary | ICD-10-CM

## 2020-07-02 DIAGNOSIS — Z9484 Stem cells transplant status: Principal | ICD-10-CM

## 2020-07-02 DIAGNOSIS — Z005 Encounter for examination of potential donor of organ and tissue: Principal | ICD-10-CM

## 2020-07-02 LAB — CBC WITH DIFFERENTIAL (CANCER CENTER ONLY)
Abs Immature Granulocytes: 0.11 10*3/uL — ABNORMAL HIGH (ref 0.00–0.07)
Basophils Absolute: 0 10*3/uL (ref 0.0–0.1)
Basophils Relative: 0 %
Eosinophils Absolute: 0 10*3/uL (ref 0.0–0.5)
Eosinophils Relative: 3 %
HCT: 26.1 % — ABNORMAL LOW (ref 36.0–46.0)
Hemoglobin: 9.3 g/dL — ABNORMAL LOW (ref 12.0–15.0)
Immature Granulocytes: 18 %
Lymphocytes Relative: 25 %
Lymphs Abs: 0.2 10*3/uL — ABNORMAL LOW (ref 0.7–4.0)
MCH: 30.7 pg (ref 26.0–34.0)
MCHC: 35.6 g/dL (ref 30.0–36.0)
MCV: 86.1 fL (ref 80.0–100.0)
Monocytes Absolute: 0.1 10*3/uL (ref 0.1–1.0)
Monocytes Relative: 16 %
Neutro Abs: 0.2 10*3/uL — CL (ref 1.7–7.7)
Neutrophils Relative %: 38 %
Platelet Count: 13 10*3/uL — ABNORMAL LOW (ref 150–400)
RBC: 3.03 MIL/uL — ABNORMAL LOW (ref 3.87–5.11)
RDW: 15.1 % (ref 11.5–15.5)
WBC Count: 0.6 10*3/uL — CL (ref 4.0–10.5)
nRBC: 0 % (ref 0.0–0.2)

## 2020-07-02 LAB — SAMPLE TO BLOOD BANK

## 2020-07-02 MED ORDER — SODIUM CHLORIDE 0.9% FLUSH
10.0000 mL | INTRAVENOUS | Status: DC | PRN
  Administered 2020-07-02: 10 mL
  Filled 2020-07-02: qty 10

## 2020-07-02 NOTE — Progress Notes (Addendum)
[place] OFFICE PROGRESS NOTE   Diagnosis: CLL, aplastic anemia  INTERVAL HISTORY:   Ms. Burkley returns as scheduled.  She was transfused 2 units of blood June 30, 2020.  She is feeling better since the blood transfusion.  No shortness of breath.  No bruising or bleeding.  No fever.  She reports recent low abdominal discomfort after eating.  She wonders if this is due to constipation.  She took MiraLAX recently, had a bowel movement yesterday and notes improvement today.  Objective:  Vital signs in last 24 hours:  Blood pressure (!) 143/92, pulse 79, temperature 98.3 F (36.8 C), temperature source Tympanic, resp. rate 18, height _0  (1.676 m), weight 111 lb 9.6 oz (50.6 kg), SpO2 100 %.    HEENT: No thrush or ulcers.  No bleeding within the oral cavity. Resp: Lungs clear bilaterally. Cardio: Regular rate and rhythm. GI: Abdomen soft and nontender.  No hepatosplenomegaly.  No distention. Vascular: No leg edema. Neuro: Alert and oriented. Skin: No rash, no ecchymoses. Hickman catheter site without erythema.   Lab Results:  Lab Results  Component Value Date   WBC 0.6 (LL) 07/02/2020   HGB 9.3 (L) 07/02/2020   HCT 26.1 (L) 07/02/2020   MCV 86.1 07/02/2020   PLT 13 (L) 07/02/2020   NEUTROABS PENDING 07/02/2020     Medications: I have reviewed the patient's current medications.  Assessment/Plan:  CLL-diagnosed in August 2010, flow cytometry consistent with CLL Enlarged leftinguinal lymph node January 2019,smallneck/axillary nodes and palpable splenomegaly 09/11/2017  CTson 09/17/2017-3 cm necrotic appearing lymph node in the left inguinal region, borderline enlarged pelvic/retroperitoneal, chest, and axillary nodes. Mild splenomegaly.  Ultrasound-guided biopsy of the left inguinal lymph node 09/18/2017, slightly "purulent "fluid aspirated, core biopsy is consistent with an atypical lymphoid proliferation-extensive necrosis with surrounding  epithelioid histiocytes, limited intact lymphoid tissue involved with CLL  Incisional biopsy of a necrotic/purulent left inguinal lymph node on 10/01/2017-extensive necrosis with granulomatous inflammation, small amount of viable lymphoid tissue involved with CLL, AFB and fungal stains negative  Peripheral blood FISH analysis 02/05/2018-deletion 13q14, no evidence of p53 (17p13) deletion, no evidence of 11q22deletion  Bone marrow biopsy 02/26/2018-hypercellular marrow with extensive involvement by CLL, lymphocytes represent85% of all cells  Ibrutinib initiated 04/03/2018  Ibrutinib placed on hold 04/11/2018 due to onset of arthralgias  Ibrutinib resumed 04/16/2018, discontinued 04/25/2018 secondary to severe arthralgias/arthritis  Ibrutinib resumed at a dose of 140 mg daily 05/03/2018  Ibrutinib dose adjusted to 140 mg alternating with 263m 06/25/2018  Ibrutinib discontinued 07/03/2018 secondary to severe arthralgias  Acalabrutinib 08/16/2018, discontinued 11/15/2018 secondary to persistent severe transfusion dependent anemia and neutropenia/thrombocytopenia, last dose 11/14/2018  Bone marrow biopsy 11/21/2018-decreased cellularity, involvement by CLL, decreased erythroid and granulocytic precursors, decreased megakaryocytes  Cycle 1 rituximab 12/06/2018  Bone marrow biopsy 12/24/2018 at [facility]-hypocellular bone marrow (10%) involved by CLL, representing 50% of marrow cellularity; markedly decreased trilineage hematopoiesis including essentially absent erythropoiesis  Bone marrow biopsy 03/06/2027 [facility]-hypocellular bone marrow (20%) with marked reduction of maturing hematopoietic elements; numerous lymphoid aggregates consistent with involvement by CLL, representing approximately 50% of marrow cellularity by flow cytometry  [facility] followed by prednisone taper  Eltrombopag beginning 07/05/2019  Cyclosporine discontinued 07/08/2019  Tacrolimus 07/10/2019,  discontinued 09/24/2019  [facility] (<5% cellular) essentially devoid of hematopoietic elements; Persistent chronic lymphocytic leukemia, representing 23% of marrow cellularity by flow cytometric analysis; Routine cytogenetic analysis is pending.  CT CAP  10/10/2019 showed no acute process or evidence of active lymphoma/leukemia within the chest, abdomen, or pelvis. Resection or resolution of previously described necrotic left inguinal node  Tacrolimus resumed 10/17/2019, discontinued on 10/28/2019 and resumed at a dose of 0.5 mg twice daily on 10/29/2019, tacrolimus escalated to 1 mg twice daily 11/14/2019  Bone marrow biopsy at [facility] 12/26/1998 21-20% cellular marrow, CLL involving approximately 20% of the cellular component  Eltrombopag discontinued week of 12/29/2019  01/01/2020 -induction chemotherapy with bendamustine, fludarabine, rituximab, tacrolimus started day -3 and methotrexate given on day +1, +3, +6, and +11  Bone marrow biopsy 05/05/2020-30% cellular marrow with adequate megakaryocytes, increased erythropoiesis and diminished granulopoiesis, 3-5% blasts, cells are 94% donor origin  2.Hypothyroidism 3.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 Augmentin prescribed. 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 Pancytopenia secondary to CLL and a hypocellular bone marrow  G-CSF and Nplate started 3/49/1791, G-CSF changed to daily beginning 12/17/2018; G-CSF discontinued 12/31/2018, last Nplate 01/15/2019  Began prednisone  60 mg daily 01/11/19, tolerating moderately well except jitteriness, irritability, and difficulty sleeping. Tapered to 40 mg daily x1 week starting 01/24/19, reduce by 10 mg each week until discontinued. Prednisone discontinued 02/20/2019.  Promacta started 01/20/2019, dose increased to 100 mg daily 02/04/2019; dose increased to 150 mg daily 02/21/2019  IVIG daily for 2 days beginning 02/25/2019  9. 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 No lymphadenopathy or splenomegaly. Exjade beginning 06/28/2019. 13. Elevated creatinine-cyclosporine toxicity?Elevated 10/28/2019 with hyperkalemia-tacrolimus induced? 14.Vaginal bleeding beginning 07/13/2019-Mirena device removed, uterine fibroids and possible endometrial fluid or thickening in the fundus per CT CAP on 10/10/19 15.Elevated lithium level 08/08/2019, lithium toxicity?-Lithium held 08/08/2019-08/12/2019, then resumed at a lower dose 16. Deconditioning, secondary to n/v/d 17. Orthostatic hypotension 09/15/19,received500 cc NS in clinic   Disposition: Ms. Brandenburger appears unchanged.  She continues to have severe pancytopenia.  She was transfused 2 units of blood earlier this week with a good response.  She understands to contact the office with signs/symptoms suggestive of progressive anemia, bruising/bleeding, fever/signs of infection.  She continues close follow-up with Dr. Oretha Ellis at [facility] with repeat transplant planned.  She will return for a follow-up CBC in 1 week.  She returns to [facility] on 07/14/2020.  We will see her in follow-up here on 07/22/2020.  We will continue to provide transfusion support as needed.  Patient seen with Dr. Benay Spice.    Ned Card ANP/GNP-BC   07/02/2020  9:57 AM This was a shared  visit with Ned Card.  Ms. Amaral appears stable.  She has persistent severe pancytopenia.  I discussed the case with Dr. Oretha Ellis earlier this week.  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  The plan is to proceed with another allogeneic transplant from a different donor.  This is being scheduled for the end of this month or early January.  We will continue to provide transfusion support as needed.  [name], MD

## 2020-07-02 NOTE — Patient Instructions (Signed)

## 2020-07-02 NOTE — Telephone Encounter (Signed)
CRITICAL VALUE STICKER  CRITICAL VALUE: WBC = 0.6 and ANC = 0.2  RECEIVER (on-site recipient of call): Yetta Glassman, CMA  DATE & TIME NOTIFIED: 07/02/20 at 9:49am  MESSENGER (representative from lab): Lelan Pons  MD NOTIFIED: Lattie Haw, NP  TIME OF NOTIFICATION: 07/02/20 at 9:55am  RESPONSE: Notification given to Colletta Maryland, LPN for follow-up for provider.

## 2020-07-05 ENCOUNTER — Encounter: Admit: 2020-07-05 | Discharge: 2020-07-05 | Payer: PRIVATE HEALTH INSURANCE

## 2020-07-05 DIAGNOSIS — Z005 Encounter for examination of potential donor of organ and tissue: Secondary | ICD-10-CM | POA: Diagnosis not present

## 2020-07-05 DIAGNOSIS — Z7682 Awaiting organ transplant status: Principal | ICD-10-CM

## 2020-07-06 ENCOUNTER — Telehealth: Payer: Self-pay | Admitting: *Deleted

## 2020-07-06 DIAGNOSIS — C9110 Chronic lymphocytic leukemia of B-cell type not having achieved remission: Secondary | ICD-10-CM

## 2020-07-06 NOTE — Telephone Encounter (Signed)
Call from nurse, Suanne Marker at [facility] requesting a tacrolimus trough level be drawn here on Friday. Order added and patient notified so she will hold her morning dose. Fax results to [phone]

## 2020-07-07 ENCOUNTER — Telehealth: Payer: Self-pay | Admitting: Oncology

## 2020-07-07 DIAGNOSIS — Z005 Encounter for examination of potential donor of organ and tissue: Principal | ICD-10-CM

## 2020-07-07 NOTE — Telephone Encounter (Signed)
Called patient regarding upcoming appointment, patient is notified. °

## 2020-07-09 ENCOUNTER — Other Ambulatory Visit: Payer: Self-pay | Admitting: *Deleted

## 2020-07-09 ENCOUNTER — Inpatient Hospital Stay: Payer: BC Managed Care – PPO

## 2020-07-09 ENCOUNTER — Telehealth: Payer: Self-pay

## 2020-07-09 ENCOUNTER — Telehealth: Payer: Self-pay | Admitting: *Deleted

## 2020-07-09 ENCOUNTER — Other Ambulatory Visit: Payer: Self-pay

## 2020-07-09 DIAGNOSIS — Z452 Encounter for adjustment and management of vascular access device: Secondary | ICD-10-CM

## 2020-07-09 DIAGNOSIS — D649 Anemia, unspecified: Secondary | ICD-10-CM

## 2020-07-09 DIAGNOSIS — Z95828 Presence of other vascular implants and grafts: Secondary | ICD-10-CM

## 2020-07-09 DIAGNOSIS — C9110 Chronic lymphocytic leukemia of B-cell type not having achieved remission: Secondary | ICD-10-CM

## 2020-07-09 DIAGNOSIS — D696 Thrombocytopenia, unspecified: Secondary | ICD-10-CM

## 2020-07-09 LAB — CBC WITH DIFFERENTIAL (CANCER CENTER ONLY)
Abs Immature Granulocytes: 0 10*3/uL (ref 0.00–0.07)
Band Neutrophils: 13 %
Basophils Absolute: 0 10*3/uL (ref 0.0–0.1)
Basophils Relative: 0 %
Eosinophils Absolute: 0 10*3/uL (ref 0.0–0.5)
Eosinophils Relative: 1 %
HCT: 22.2 % — ABNORMAL LOW (ref 36.0–46.0)
Hemoglobin: 7.8 g/dL — ABNORMAL LOW (ref 12.0–15.0)
Lymphocytes Relative: 33 %
Lymphs Abs: 0.2 10*3/uL — ABNORMAL LOW (ref 0.7–4.0)
MCH: 30.8 pg (ref 26.0–34.0)
MCHC: 35.1 g/dL (ref 30.0–36.0)
MCV: 87.7 fL (ref 80.0–100.0)
Metamyelocytes Relative: 5 %
Monocytes Absolute: 0 10*3/uL — ABNORMAL LOW (ref 0.1–1.0)
Monocytes Relative: 6 %
Myelocytes: 1 %
Neutro Abs: 0.4 10*3/uL — CL (ref 1.7–7.7)
Neutrophils Relative %: 41 %
Platelet Count: 16 10*3/uL — ABNORMAL LOW (ref 150–400)
RBC: 2.53 MIL/uL — ABNORMAL LOW (ref 3.87–5.11)
RDW: 15.6 % — ABNORMAL HIGH (ref 11.5–15.5)
WBC Count: 0.7 10*3/uL — CL (ref 4.0–10.5)
nRBC: 0 % (ref 0.0–0.2)

## 2020-07-09 LAB — PREPARE RBC (CROSSMATCH)

## 2020-07-09 MED ORDER — SODIUM CHLORIDE 0.9% FLUSH
10.0000 mL | INTRAVENOUS | Status: DC | PRN
  Administered 2020-07-09: 10 mL
  Filled 2020-07-09: qty 10

## 2020-07-09 MED ORDER — HEPARIN SOD (PORK) LOCK FLUSH 100 UNIT/ML IV SOLN
500.0000 [IU] | Freq: Once | INTRAVENOUS | Status: AC | PRN
  Administered 2020-07-09: 250 [IU]
  Filled 2020-07-09: qty 5

## 2020-07-09 NOTE — Telephone Encounter (Signed)
CRITICAL VALUE STICKER  CRITICAL VALUE: WBC = 0.7 and ANC = 0.3  RECEIVER (on-site recipient of call): Yetta Glassman, CMA  DATE & TIME NOTIFIED: 07/09/20 at 12:47pm  MESSENGER (representative from lab): Suanne Marker  MD NOTIFIED: Dr. Benay Spice  TIME OF NOTIFICATION: 07/09/20 at 12:50pm  RESPONSE: Notification given to Colletta Maryland, LPN for follow-up with provider.

## 2020-07-09 NOTE — Patient Instructions (Signed)

## 2020-07-09 NOTE — Progress Notes (Signed)
Transfusion orders for 1 unit on 07/10/20 placed. Does not require any premeds.

## 2020-07-09 NOTE — Telephone Encounter (Signed)
MD made aware of CBC results today at 12:55 and has no new orders. Patient said she feels short of breath a bit weak and wants transfusion before going to [facility] on 12/21. OK for 1 unit. She will return to lab for TXM to be completed. Infusion will transfuse 1 unit tomorrow at 0900.

## 2020-07-10 ENCOUNTER — Inpatient Hospital Stay: Payer: BC Managed Care – PPO

## 2020-07-10 DIAGNOSIS — D649 Anemia, unspecified: Secondary | ICD-10-CM

## 2020-07-10 DIAGNOSIS — C9110 Chronic lymphocytic leukemia of B-cell type not having achieved remission: Secondary | ICD-10-CM | POA: Diagnosis not present

## 2020-07-10 MED ORDER — SODIUM CHLORIDE 0.9% FLUSH
10.0000 mL | INTRAVENOUS | Status: AC | PRN
  Administered 2020-07-10: 10 mL
  Filled 2020-07-10: qty 10

## 2020-07-10 MED ORDER — HEPARIN SOD (PORK) LOCK FLUSH 100 UNIT/ML IV SOLN
250.0000 [IU] | INTRAVENOUS | Status: AC | PRN
  Administered 2020-07-10: 250 [IU]
  Filled 2020-07-10: qty 5

## 2020-07-10 MED ORDER — SODIUM CHLORIDE 0.9% IV SOLUTION
250.0000 mL | Freq: Once | INTRAVENOUS | Status: AC
  Administered 2020-07-10: 250 mL via INTRAVENOUS
  Filled 2020-07-10: qty 250

## 2020-07-10 NOTE — Progress Notes (Signed)
Pt tolerated transfusion well. Pt and VS stable at discharge.

## 2020-07-10 NOTE — Patient Instructions (Signed)

## 2020-07-11 LAB — TYPE AND SCREEN
ABO/RH(D): O NEG
Antibody Screen: NEGATIVE
Unit division: 0

## 2020-07-11 LAB — BPAM RBC
Blood Product Expiration Date: 202112282359
ISSUE DATE / TIME: 202112180910
Unit Type and Rh: 9500

## 2020-07-12 ENCOUNTER — Encounter: Payer: Self-pay | Admitting: *Deleted

## 2020-07-12 LAB — TACROLIMUS LEVEL: Tacrolimus (FK506) - LabCorp: 4.7 ng/mL (ref 2.0–20.0)

## 2020-07-12 NOTE — Progress Notes (Signed)
Faxed Tacrolimus level results to [place], [place] at [place] as requested.

## 2020-07-14 ENCOUNTER — Encounter: Admit: 2020-07-14 | Discharge: 2020-07-14 | Payer: PRIVATE HEALTH INSURANCE

## 2020-07-14 ENCOUNTER — Non-Acute Institutional Stay: Admit: 2020-07-14 | Discharge: 2020-07-14 | Payer: PRIVATE HEALTH INSURANCE

## 2020-07-14 DIAGNOSIS — B1910 Unspecified viral hepatitis B without hepatic coma: Principal | ICD-10-CM

## 2020-07-14 DIAGNOSIS — Z9484 Stem cells transplant status: Principal | ICD-10-CM

## 2020-07-14 DIAGNOSIS — Q998 Other specified chromosome abnormalities: Principal | ICD-10-CM

## 2020-07-14 DIAGNOSIS — E039 Hypothyroidism, unspecified: Principal | ICD-10-CM

## 2020-07-14 DIAGNOSIS — Z79890 Hormone replacement therapy (postmenopausal): Principal | ICD-10-CM

## 2020-07-14 DIAGNOSIS — C9110 Chronic lymphocytic leukemia of B-cell type not having achieved remission: Principal | ICD-10-CM

## 2020-07-14 DIAGNOSIS — D801 Nonfamilial hypogammaglobulinemia: Principal | ICD-10-CM

## 2020-07-14 DIAGNOSIS — Z9221 Personal history of antineoplastic chemotherapy: Principal | ICD-10-CM

## 2020-07-14 DIAGNOSIS — Z7682 Awaiting organ transplant status: Principal | ICD-10-CM

## 2020-07-14 DIAGNOSIS — Z792 Long term (current) use of antibiotics: Principal | ICD-10-CM

## 2020-07-14 DIAGNOSIS — R161 Splenomegaly, not elsewhere classified: Principal | ICD-10-CM

## 2020-07-14 DIAGNOSIS — D61818 Other pancytopenia: Principal | ICD-10-CM

## 2020-07-14 MED ORDER — PREDNISONE 20 MG TABLET
ORAL_TABLET | Freq: Every day | ORAL | 0 refills | 30.00000 days | Status: CP
Start: 2020-07-14 — End: 2020-08-13

## 2020-07-18 DIAGNOSIS — C9110 Chronic lymphocytic leukemia of B-cell type not having achieved remission: Principal | ICD-10-CM

## 2020-07-18 MED ORDER — POSACONAZOLE 100 MG TABLET,DELAYED RELEASE
ORAL_TABLET | Freq: Every day | ORAL | 1 refills | 30 days
Start: 2020-07-18 — End: ?

## 2020-07-19 DIAGNOSIS — C9110 Chronic lymphocytic leukemia of B-cell type not having achieved remission: Principal | ICD-10-CM

## 2020-07-19 MED ORDER — POSACONAZOLE 100 MG TABLET,DELAYED RELEASE
ORAL_TABLET | Freq: Every day | ORAL | 1 refills | 30 days | Status: CP
Start: 2020-07-19 — End: ?
  Filled 2020-07-20: qty 90, 30d supply, fill #0

## 2020-07-20 ENCOUNTER — Encounter: Admit: 2020-07-20 | Discharge: 2020-07-21 | Payer: PRIVATE HEALTH INSURANCE

## 2020-07-20 ENCOUNTER — Ambulatory Visit: Admit: 2020-07-20 | Discharge: 2020-07-21 | Payer: PRIVATE HEALTH INSURANCE

## 2020-07-20 DIAGNOSIS — C9111 Chronic lymphocytic leukemia of B-cell type in remission: Principal | ICD-10-CM

## 2020-07-20 DIAGNOSIS — Z9484 Stem cells transplant status: Principal | ICD-10-CM

## 2020-07-20 DIAGNOSIS — C9110 Chronic lymphocytic leukemia of B-cell type not having achieved remission: Principal | ICD-10-CM

## 2020-07-20 DIAGNOSIS — D849 Immunodeficiency, unspecified: Principal | ICD-10-CM

## 2020-07-20 DIAGNOSIS — Z7682 Awaiting organ transplant status: Principal | ICD-10-CM

## 2020-07-20 DIAGNOSIS — D619 Aplastic anemia, unspecified: Principal | ICD-10-CM

## 2020-07-20 MED FILL — FOLIC ACID 1 MG TABLET: ORAL | 30 days supply | Qty: 30 | Fill #4

## 2020-07-20 MED FILL — FOLIC ACID 1 MG TABLET: 30 days supply | Qty: 30 | Fill #4 | Status: AC

## 2020-07-20 MED FILL — POSACONAZOLE 100 MG TABLET,DELAYED RELEASE: 30 days supply | Qty: 90 | Fill #0 | Status: AC

## 2020-07-22 ENCOUNTER — Encounter: Admit: 2020-07-22 | Discharge: 2020-07-23 | Payer: PRIVATE HEALTH INSURANCE

## 2020-07-22 ENCOUNTER — Telehealth: Payer: Self-pay | Admitting: Oncology

## 2020-07-22 ENCOUNTER — Inpatient Hospital Stay: Payer: BC Managed Care – PPO | Admitting: Oncology

## 2020-07-22 ENCOUNTER — Telehealth: Payer: Self-pay | Admitting: *Deleted

## 2020-07-22 ENCOUNTER — Inpatient Hospital Stay: Payer: BC Managed Care – PPO

## 2020-07-22 DIAGNOSIS — Z7682 Awaiting organ transplant status: Principal | ICD-10-CM

## 2020-07-22 NOTE — Telephone Encounter (Signed)
Scheduled appt per 12/30 sch msg - pt is aware of appt date and time

## 2020-07-22 NOTE — Telephone Encounter (Signed)
Called requesting appointment next week for lab/flush and to see MD or Meagan Walsh. Had bone marrow biopsy on 12/29. Last transfusion of blood and platelets was 07/15/20. Sees Dr. Theodoro Clock on 07/28/20 to discuss her admission plans. Per Dr. Benay Spice: Have her come on Monday and see NP. High priority scheduling message sent.

## 2020-07-25 DIAGNOSIS — D702 Other drug-induced agranulocytosis: Principal | ICD-10-CM

## 2020-07-26 ENCOUNTER — Encounter: Admit: 2020-07-26 | Discharge: 2020-08-23 | Payer: PRIVATE HEALTH INSURANCE

## 2020-07-26 ENCOUNTER — Encounter
Admit: 2020-07-26 | Discharge: 2020-08-23 | Payer: PRIVATE HEALTH INSURANCE | Attending: Radiation Oncology | Primary: Radiation Oncology

## 2020-07-26 ENCOUNTER — Encounter
Admit: 2020-07-26 | Discharge: 2020-08-23 | Payer: PRIVATE HEALTH INSURANCE | Attending: Primary Care | Primary: Primary Care

## 2020-07-26 ENCOUNTER — Other Ambulatory Visit: Payer: Self-pay

## 2020-07-26 ENCOUNTER — Inpatient Hospital Stay: Payer: BC Managed Care – PPO

## 2020-07-26 ENCOUNTER — Encounter: Payer: Self-pay | Admitting: Nurse Practitioner

## 2020-07-26 ENCOUNTER — Inpatient Hospital Stay: Payer: BC Managed Care – PPO | Attending: Nurse Practitioner | Admitting: Nurse Practitioner

## 2020-07-26 ENCOUNTER — Other Ambulatory Visit: Payer: Self-pay | Admitting: *Deleted

## 2020-07-26 VITALS — BP 135/81 | HR 98 | Temp 96.9°F | Resp 18 | Ht 66.0 in | Wt 117.5 lb

## 2020-07-26 DIAGNOSIS — D801 Nonfamilial hypogammaglobulinemia: Secondary | ICD-10-CM | POA: Insufficient documentation

## 2020-07-26 DIAGNOSIS — Z452 Encounter for adjustment and management of vascular access device: Secondary | ICD-10-CM

## 2020-07-26 DIAGNOSIS — D696 Thrombocytopenia, unspecified: Secondary | ICD-10-CM

## 2020-07-26 DIAGNOSIS — D61818 Other pancytopenia: Secondary | ICD-10-CM | POA: Diagnosis not present

## 2020-07-26 DIAGNOSIS — C9110 Chronic lymphocytic leukemia of B-cell type not having achieved remission: Secondary | ICD-10-CM

## 2020-07-26 DIAGNOSIS — E039 Hypothyroidism, unspecified: Secondary | ICD-10-CM | POA: Insufficient documentation

## 2020-07-26 DIAGNOSIS — Z79899 Other long term (current) drug therapy: Secondary | ICD-10-CM | POA: Diagnosis not present

## 2020-07-26 DIAGNOSIS — D649 Anemia, unspecified: Secondary | ICD-10-CM | POA: Diagnosis not present

## 2020-07-26 DIAGNOSIS — I951 Orthostatic hypotension: Secondary | ICD-10-CM | POA: Diagnosis not present

## 2020-07-26 DIAGNOSIS — Z95828 Presence of other vascular implants and grafts: Secondary | ICD-10-CM

## 2020-07-26 DIAGNOSIS — K649 Unspecified hemorrhoids: Principal | ICD-10-CM

## 2020-07-26 DIAGNOSIS — F419 Anxiety disorder, unspecified: Principal | ICD-10-CM

## 2020-07-26 DIAGNOSIS — N179 Acute kidney failure, unspecified: Principal | ICD-10-CM

## 2020-07-26 DIAGNOSIS — C9111 Chronic lymphocytic leukemia of B-cell type in remission: Principal | ICD-10-CM

## 2020-07-26 DIAGNOSIS — G4700 Insomnia, unspecified: Principal | ICD-10-CM

## 2020-07-26 DIAGNOSIS — Z803 Family history of malignant neoplasm of breast: Principal | ICD-10-CM

## 2020-07-26 DIAGNOSIS — Z79890 Hormone replacement therapy (postmenopausal): Principal | ICD-10-CM

## 2020-07-26 DIAGNOSIS — D619 Aplastic anemia, unspecified: Principal | ICD-10-CM

## 2020-07-26 DIAGNOSIS — F32A Depression, unspecified: Principal | ICD-10-CM

## 2020-07-26 DIAGNOSIS — Z9221 Personal history of antineoplastic chemotherapy: Principal | ICD-10-CM

## 2020-07-26 DIAGNOSIS — M8589 Other specified disorders of bone density and structure, multiple sites: Principal | ICD-10-CM

## 2020-07-26 DIAGNOSIS — Z48290 Encounter for aftercare following bone marrow transplant: Principal | ICD-10-CM

## 2020-07-26 DIAGNOSIS — Z8701 Personal history of pneumonia (recurrent): Principal | ICD-10-CM

## 2020-07-26 DIAGNOSIS — Z8042 Family history of malignant neoplasm of prostate: Principal | ICD-10-CM

## 2020-07-26 DIAGNOSIS — R5383 Other fatigue: Principal | ICD-10-CM

## 2020-07-26 DIAGNOSIS — R109 Unspecified abdominal pain: Principal | ICD-10-CM

## 2020-07-26 DIAGNOSIS — K5900 Constipation, unspecified: Principal | ICD-10-CM

## 2020-07-26 DIAGNOSIS — B1910 Unspecified viral hepatitis B without hepatic coma: Principal | ICD-10-CM

## 2020-07-26 LAB — CBC WITH DIFFERENTIAL (CANCER CENTER ONLY)
Abs Immature Granulocytes: 0.02 10*3/uL (ref 0.00–0.07)
Basophils Absolute: 0 10*3/uL (ref 0.0–0.1)
Basophils Relative: 0 %
Eosinophils Absolute: 0 10*3/uL (ref 0.0–0.5)
Eosinophils Relative: 2 %
HCT: 17.5 % — ABNORMAL LOW (ref 36.0–46.0)
Hemoglobin: 6.3 g/dL — CL (ref 12.0–15.0)
Immature Granulocytes: 3 %
Lymphocytes Relative: 39 %
Lymphs Abs: 0.2 10*3/uL — ABNORMAL LOW (ref 0.7–4.0)
MCH: 31.2 pg (ref 26.0–34.0)
MCHC: 36 g/dL (ref 30.0–36.0)
MCV: 86.6 fL (ref 80.0–100.0)
Monocytes Absolute: 0.1 10*3/uL (ref 0.1–1.0)
Monocytes Relative: 17 %
Neutro Abs: 0.2 10*3/uL — CL (ref 1.7–7.7)
Neutrophils Relative %: 39 %
Platelet Count: 10 10*3/uL — ABNORMAL LOW (ref 150–400)
RBC: 2.02 MIL/uL — ABNORMAL LOW (ref 3.87–5.11)
RDW: 14.2 % (ref 11.5–15.5)
WBC Count: 0.6 10*3/uL — CL (ref 4.0–10.5)
nRBC: 0 % (ref 0.0–0.2)

## 2020-07-26 LAB — SAMPLE TO BLOOD BANK

## 2020-07-26 MED ORDER — SODIUM CHLORIDE 0.9% FLUSH
10.0000 mL | INTRAVENOUS | Status: DC | PRN
  Administered 2020-07-26: 10 mL
  Filled 2020-07-26: qty 10

## 2020-07-26 MED ORDER — HEPARIN SOD (PORK) LOCK FLUSH 100 UNIT/ML IV SOLN
250.0000 [IU] | Freq: Once | INTRAVENOUS | Status: AC
  Administered 2020-07-26: 250 [IU]
  Filled 2020-07-26: qty 5

## 2020-07-26 MED ORDER — CEFDINIR 300 MG CAPSULE
ORAL_CAPSULE | 1 refills | 0 days | Status: CP
Start: 2020-07-26 — End: 2020-09-03

## 2020-07-26 NOTE — Progress Notes (Addendum)
[place] OFFICE PROGRESS NOTE   Diagnosis: CLL, aplastic anemia  INTERVAL HISTORY:   Meagan Walsh returns for follow-up.  She was transfused a unit of blood on 07/10/2020.  She was transfused blood and platelets at [facility] last week.  She denies bleeding.  Main complaint is weakness.  Overall she is feeling well.  No fever, cough, shortness of breath.  Objective:  Vital signs in last 24 hours:  Blood pressure 135/81, pulse 98, temperature (!) 96.9 F (36.1 C), temperature source Tympanic, resp. rate 18, height 5' 6" (1.676 m), weight 117 lb 8 oz (53.3 kg), SpO2 98 %.    HEENT: Tiny ecchymosis right buccal mucosa. Resp: Lungs clear bilaterally. Cardio: Regular rate and rhythm. GI: Abdomen soft and nontender.  No hepatomegaly. Vascular: No leg edema. Neuro: Alert and oriented.  Hickman catheter site is without erythema.  Lab Results:  Lab Results  Component Value Date   WBC 0.6 (LL) 07/26/2020   HGB 6.3 (LL) 07/26/2020   HCT 17.5 (L) 07/26/2020   MCV 86.6 07/26/2020   PLT 10 (L) 07/26/2020   NEUTROABS PENDING 07/26/2020    Imaging:  No results found.  Medications: I have reviewed the patient's current medications.  Assessment/Plan: 1. 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 Mild splenomegaly.  Ultrasound-guided biopsy of the left inguinal lymph node 09/18/2017, slightly "purulent "fluid aspirated, core biopsy is consistent with an atypical lymphoid proliferation-extensive necrosis with surrounding epithelioid histiocytes, limited intact lymphoid tissue involved with CLL  Incisional biopsy of a necrotic/purulent left inguinal lymph node on 10/01/2017-extensive necrosis with  granulomatous inflammation, small amount of viable lymphoid tissue involved with CLL, AFB and fungal stains negative  Peripheral blood FISH analysis 02/05/2018-deletion 13q14, no evidence of p53 (17p13) deletion, no evidence of 11q22deletion  Bone marrow biopsy 02/26/2018-hypercellular marrow with extensive involvement by CLL, lymphocytes represent85% of all cells  Ibrutinib initiated 04/03/2018  Ibrutinib placed on hold 04/11/2018 due to onset of arthralgias  Ibrutinib resumed 04/16/2018, discontinued 04/25/2018 secondary to severe arthralgias/arthritis  Ibrutinib resumed at a dose of 140 mg daily 05/03/2018  Ibrutinib dose adjusted to 140 mg alternating with 277m 06/25/2018  Ibrutinib discontinued 07/03/2018 secondary to severe arthralgias  Acalabrutinib 08/16/2018, discontinued 11/15/2018 secondary to persistent severe transfusion dependent anemia and neutropenia/thrombocytopenia, last dose 11/14/2018  Bone marrow biopsy 11/21/2018-decreased cellularity, involvement by CLL, decreased erythroid and granulocytic precursors, decreased megakaryocytes  Cycle 1 rituximab 12/06/2018  Bone marrow biopsy 12/24/2018 at [facility]-hypocellular bone marrow (10%) involved by CLL, representing 50% of marrow cellularity; markedly decreased trilineage hematopoiesis including essentially absent erythropoiesis  Bone marrow biopsy 03/06/2027 [facility]-hypocellular bone marrow (20%) with marked reduction of maturing hematopoietic elements; numerous lymphoid aggregates consistent with involvement by CLL, representing approximately 50% of marrow cellularity by flow cytometry  [facility] by [facility]nisone [facility]  Eltrombopag beginning 07/05/2019  Cyclosporine discontinued 07/08/2019  Tacrolimus 07/10/2019, discontinued 09/24/2019  [facility] (<5% cellular) essentially devoid of hematopoietic elements; Persistent chronic lymphocytic  leukemia, representing 23% of marrow cellularity by flow cytometric analysis; Routine cytogenetic analysis is pending.  CT CAP 10/10/2019 showed no acute process or evidence of active lymphoma/leukemia within the chest, abdomen, or pelvis. Resection or resolution of previously described necrotic left inguinal node  Tacrolimus resumed 10/17/2019,  discontinued on 10/28/2019 and resumed at a dose of 0.5 mg twice daily on 10/29/2019, tacrolimus escalated to 1 mg twice daily 11/14/2019  Bone marrow biopsy at [facility] 12/26/1998 21-20% cellular marrow, CLL involving approximately 20% of the cellular component  Eltrombopag discontinued week of 12/29/2019  01/01/2020 -induction chemotherapy with bendamustine, fludarabine, rituximab, tacrolimus started day -3 and methotrexate given on day +1, +3, +6, and +11  Bone marrow biopsy10/13/2021-30% cellular marrow with adequate megakaryocytes, increased erythropoiesis and diminished granulopoiesis, 3-5% blasts, cells are 94% donor origin  2.Hypothyroidism 3.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 Augmentin prescribed. 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 Pancytopenia secondary to CLL and a hypocellular bone marrow  G-CSF and Nplate started 2/62/0355, G-CSF changed to daily beginning 12/17/2018; G-CSF discontinued 12/31/2018, last Nplate 01/15/2019  Began prednisone 60 mg daily 01/11/19, tolerating moderately well except jitteriness, irritability, and difficulty sleeping. Tapered to 40 mg daily x1 week starting 01/24/19, reduce by 10 mg each week  until discontinued. Prednisone discontinued 02/20/2019.  Promacta started 01/20/2019, dose increased to 100 mg daily 02/04/2019; dose increased to 150 mg daily 02/21/2019  IVIG daily for 2 days beginning 02/25/2019  9. 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 No lymphadenopathy or splenomegaly. Exjade beginning 06/28/2019. 13. Elevated creatinine-cyclosporine toxicity?Elevated 10/28/2019 with hyperkalemia-tacrolimus induced? 14.Vaginal bleeding beginning 07/13/2019-Mirena device removed, uterine fibroids and possible endometrial fluid or thickening in the fundus per CT CAP on 10/10/19 15.Elevated lithium level 08/08/2019, lithium toxicity?-Lithium held 08/08/2019-08/12/2019, then resumed at a lower dose 16. Deconditioning, secondary to n/v/d 17. Orthostatic hypotension 09/15/19,received500 cc NS in clinic  Disposition: Meagan Walsh appears unchanged.  She continues to have severe pancytopenia.  We are making arrangements for a blood transfusion.  Hold on platelets unless she has bleeding.  She has follow-up at [facility] later this week with possible admission.  We did not schedule follow-up here but are available to see her if needed.  Patient seen with Dr. Benay Spice.  Ned Card ANP/GNP-BC   07/26/2020  11:00 AM This was a shared visit with Ned Card.  Meagan Walsh has persistent severe pancytopenia.  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  She will be transfused with packed red blood cells for symptomatic anemia.  [name], MD

## 2020-07-26 NOTE — Progress Notes (Signed)
Lab orders

## 2020-07-26 NOTE — Progress Notes (Signed)
Per Dr. [name]: Check platelet count when she is here on 07/27/20.

## 2020-07-27 ENCOUNTER — Inpatient Hospital Stay: Payer: BC Managed Care – PPO

## 2020-07-27 ENCOUNTER — Other Ambulatory Visit: Payer: Self-pay

## 2020-07-27 ENCOUNTER — Telehealth: Payer: Self-pay | Admitting: Psychiatry

## 2020-07-27 ENCOUNTER — Other Ambulatory Visit: Payer: Self-pay | Admitting: Psychiatry

## 2020-07-27 DIAGNOSIS — C9110 Chronic lymphocytic leukemia of B-cell type not having achieved remission: Secondary | ICD-10-CM | POA: Diagnosis not present

## 2020-07-27 DIAGNOSIS — D696 Thrombocytopenia, unspecified: Secondary | ICD-10-CM

## 2020-07-27 DIAGNOSIS — F3342 Major depressive disorder, recurrent, in full remission: Secondary | ICD-10-CM

## 2020-07-27 DIAGNOSIS — D649 Anemia, unspecified: Secondary | ICD-10-CM

## 2020-07-27 LAB — PLATELET COUNT (CANCER CENTER ONLY): Platelet Count: 17 10*3/uL — ABNORMAL LOW (ref 150–400)

## 2020-07-27 LAB — PREPARE RBC (CROSSMATCH)

## 2020-07-27 MED ORDER — VORTIOXETINE HBR 10 MG PO TABS: 10.0000 mg | ORAL_TABLET | Freq: Every day | ORAL | 0 refills | Status: DC

## 2020-07-27 MED ORDER — SODIUM CHLORIDE 0.9% IV SOLUTION
250.0000 mL | Freq: Once | INTRAVENOUS | Status: AC
  Administered 2020-07-27: 250 mL via INTRAVENOUS
  Filled 2020-07-27: qty 250

## 2020-07-27 MED ORDER — MIRTAZAPINE 30 MG PO TABS: 30.0000 mg | ORAL_TABLET | Freq: Every day | ORAL | 0 refills | Status: DC

## 2020-07-27 MED ORDER — SODIUM CHLORIDE 0.9% FLUSH
10.0000 mL | INTRAVENOUS | Status: AC | PRN
  Administered 2020-07-27: 10 mL
  Filled 2020-07-27: qty 10

## 2020-07-27 MED ORDER — ZOLPIDEM TARTRATE 10 MG PO TABS
10.0000 mg | ORAL_TABLET | Freq: Every day | ORAL | 0 refills | Status: DC
Start: 2020-07-27 — End: 2020-10-17

## 2020-07-27 MED ORDER — LITHIUM CARBONATE 300 MG PO TABS
300.0000 mg | ORAL_TABLET | Freq: Every day | ORAL | 0 refills | Status: DC
Start: 2020-07-27 — End: 2022-08-21

## 2020-07-27 MED ORDER — HEPARIN SOD (PORK) LOCK FLUSH 100 UNIT/ML IV SOLN
500.0000 [IU] | Freq: Every day | INTRAVENOUS | Status: AC | PRN
  Administered 2020-07-27: 500 [IU]
  Filled 2020-07-27: qty 5

## 2020-07-27 MED ORDER — ALPRAZOLAM 0.25 MG TABLET
Freq: Every day | ORAL | 0.00000 days
Start: 2020-07-27 — End: 2020-09-03

## 2020-07-27 MED ORDER — MIRTAZAPINE 30 MG TABLET
Freq: Every day | ORAL | 0 days
Start: 2020-07-27 — End: 2020-08-04

## 2020-07-27 MED FILL — TACROLIMUS 0.5 MG CAPSULE, IMMEDIATE-RELEASE: 30 days supply | Qty: 180 | Fill #1 | Status: AC

## 2020-07-27 MED FILL — TACROLIMUS 0.5 MG CAPSULE, IMMEDIATE-RELEASE: ORAL | 30 days supply | Qty: 180 | Fill #1

## 2020-07-27 NOTE — Telephone Encounter (Signed)
Sent in 90-day prescriptions for each medicine

## 2020-07-27 NOTE — Telephone Encounter (Signed)
Pt called to report she is going back to [facility] for 2nd bone marrow. Will be there for 3 months. Will soon need refill. [phone]

## 2020-07-28 ENCOUNTER — Encounter: Admit: 2020-07-28 | Discharge: 2020-07-28 | Payer: PRIVATE HEALTH INSURANCE

## 2020-07-28 DIAGNOSIS — E039 Hypothyroidism, unspecified: Principal | ICD-10-CM

## 2020-07-28 DIAGNOSIS — T865 Complications of stem cell transplant: Principal | ICD-10-CM

## 2020-07-28 DIAGNOSIS — C9110 Chronic lymphocytic leukemia of B-cell type not having achieved remission: Principal | ICD-10-CM

## 2020-07-28 DIAGNOSIS — Q998 Other specified chromosome abnormalities: Principal | ICD-10-CM

## 2020-07-28 DIAGNOSIS — D61810 Antineoplastic chemotherapy induced pancytopenia: Principal | ICD-10-CM

## 2020-07-28 DIAGNOSIS — R590 Localized enlarged lymph nodes: Principal | ICD-10-CM

## 2020-07-28 DIAGNOSIS — R161 Splenomegaly, not elsewhere classified: Principal | ICD-10-CM

## 2020-07-28 DIAGNOSIS — Z9221 Personal history of antineoplastic chemotherapy: Principal | ICD-10-CM

## 2020-07-28 DIAGNOSIS — B1910 Unspecified viral hepatitis B without hepatic coma: Principal | ICD-10-CM

## 2020-07-28 DIAGNOSIS — Z7682 Awaiting organ transplant status: Principal | ICD-10-CM

## 2020-07-28 DIAGNOSIS — D801 Nonfamilial hypogammaglobulinemia: Principal | ICD-10-CM

## 2020-07-28 DIAGNOSIS — D619 Aplastic anemia, unspecified: Principal | ICD-10-CM

## 2020-07-28 DIAGNOSIS — Z9481 Bone marrow transplant status: Principal | ICD-10-CM

## 2020-07-28 LAB — TYPE AND SCREEN
ABO/RH(D): O NEG
Antibody Screen: NEGATIVE
Unit division: 0

## 2020-07-28 LAB — BPAM RBC
Blood Product Expiration Date: 202201312359
ISSUE DATE / TIME: 202201040846
Unit Type and Rh: 9500

## 2020-07-28 MED FILL — LEVOTHYROXINE 100 MCG TABLET: 30 days supply | Qty: 30 | Fill #3 | Status: AC

## 2020-07-28 MED FILL — LEVOTHYROXINE 100 MCG TABLET: ORAL | 30 days supply | Qty: 30 | Fill #3

## 2020-07-29 ENCOUNTER — Encounter: Admit: 2020-07-29 | Discharge: 2020-07-30 | Payer: PRIVATE HEALTH INSURANCE

## 2020-07-29 ENCOUNTER — Other Ambulatory Visit: Payer: Self-pay | Admitting: *Deleted

## 2020-07-29 ENCOUNTER — Telehealth: Payer: Self-pay | Admitting: Nurse Practitioner

## 2020-07-29 DIAGNOSIS — C9110 Chronic lymphocytic leukemia of B-cell type not having achieved remission: Secondary | ICD-10-CM

## 2020-07-29 DIAGNOSIS — Z7682 Awaiting organ transplant status: Principal | ICD-10-CM

## 2020-07-29 NOTE — Telephone Encounter (Signed)
Scheduled appt per 1/5 sch msg - pt is aware of appt date and time

## 2020-08-02 ENCOUNTER — Inpatient Hospital Stay: Payer: BC Managed Care – PPO

## 2020-08-02 ENCOUNTER — Other Ambulatory Visit: Payer: Self-pay | Admitting: *Deleted

## 2020-08-02 ENCOUNTER — Other Ambulatory Visit: Payer: Self-pay

## 2020-08-02 DIAGNOSIS — C9110 Chronic lymphocytic leukemia of B-cell type not having achieved remission: Secondary | ICD-10-CM

## 2020-08-02 DIAGNOSIS — D696 Thrombocytopenia, unspecified: Secondary | ICD-10-CM

## 2020-08-02 DIAGNOSIS — D649 Anemia, unspecified: Secondary | ICD-10-CM

## 2020-08-02 DIAGNOSIS — Z452 Encounter for adjustment and management of vascular access device: Secondary | ICD-10-CM

## 2020-08-02 DIAGNOSIS — Z95828 Presence of other vascular implants and grafts: Secondary | ICD-10-CM

## 2020-08-02 LAB — SAMPLE TO BLOOD BANK

## 2020-08-02 LAB — CBC WITH DIFFERENTIAL (CANCER CENTER ONLY)
Abs Immature Granulocytes: 0 10*3/uL (ref 0.00–0.07)
Band Neutrophils: 19 %
Basophils Absolute: 0 10*3/uL (ref 0.0–0.1)
Basophils Relative: 0 %
Eosinophils Absolute: 0 10*3/uL (ref 0.0–0.5)
Eosinophils Relative: 0 %
HCT: 16.3 % — ABNORMAL LOW (ref 36.0–46.0)
Hemoglobin: 5.7 g/dL — CL (ref 12.0–15.0)
Lymphocytes Relative: 24 %
Lymphs Abs: 0.2 10*3/uL — ABNORMAL LOW (ref 0.7–4.0)
MCH: 30.8 pg (ref 26.0–34.0)
MCHC: 35 g/dL (ref 30.0–36.0)
MCV: 88.1 fL (ref 80.0–100.0)
Metamyelocytes Relative: 4 %
Monocytes Absolute: 0.1 10*3/uL (ref 0.1–1.0)
Monocytes Relative: 13 %
Neutro Abs: 0.5 10*3/uL — ABNORMAL LOW (ref 1.7–7.7)
Neutrophils Relative %: 40 %
Platelet Count: 10 10*3/uL — ABNORMAL LOW (ref 150–400)
RBC: 1.85 MIL/uL — ABNORMAL LOW (ref 3.87–5.11)
RDW: 14.6 % (ref 11.5–15.5)
WBC Count: 0.9 10*3/uL — CL (ref 4.0–10.5)
nRBC: 0 % (ref 0.0–0.2)

## 2020-08-02 LAB — PREPARE RBC (CROSSMATCH)

## 2020-08-02 MED ORDER — HEPARIN SOD (PORK) LOCK FLUSH 100 UNIT/ML IV SOLN
500.0000 [IU] | Freq: Every day | INTRAVENOUS | Status: AC | PRN
  Administered 2020-08-02: 250 [IU]
  Filled 2020-08-02: qty 5

## 2020-08-02 MED ORDER — SODIUM CHLORIDE 0.9% IV SOLUTION
250.0000 mL | Freq: Once | INTRAVENOUS | Status: AC
  Administered 2020-08-02: 250 mL via INTRAVENOUS
  Filled 2020-08-02: qty 250

## 2020-08-02 MED ORDER — SODIUM CHLORIDE 0.9% FLUSH
10.0000 mL | INTRAVENOUS | Status: DC | PRN
  Administered 2020-08-02: 10 mL
  Filled 2020-08-02: qty 10

## 2020-08-02 MED ORDER — SODIUM CHLORIDE 0.9% FLUSH
10.0000 mL | INTRAVENOUS | Status: AC | PRN
  Administered 2020-08-02: 10 mL
  Filled 2020-08-02: qty 10

## 2020-08-02 NOTE — Progress Notes (Signed)
CRITICAL VALUE STICKER  CRITICAL VALUE: WBC 0.95, Hgb 5.7, platelets 10  RECEIVER (on-site recipient of call):Khalani Novoa,RN  DATE & TIME NOTIFIED: 08/02/20 @ 1143  MESSENGER (representative from lab): Hillary  MD NOTIFIED: Dr. Benay Spice  TIME OF NOTIFICATION: 1150  RESPONSE: 2 units blood. No platelets unless bleeding.  Recheck counts on Thursday

## 2020-08-02 NOTE — Progress Notes (Signed)
Patient stated that picc dressing and caps were changed yesterday at home by husband. Patient stated that he is the one that takes care of her dressing changed. Picc was flushed today and labs were drawn for type and screen

## 2020-08-02 NOTE — Patient Instructions (Signed)

## 2020-08-03 DIAGNOSIS — D6481 Anemia due to antineoplastic chemotherapy: Principal | ICD-10-CM

## 2020-08-03 DIAGNOSIS — D801 Nonfamilial hypogammaglobulinemia: Principal | ICD-10-CM

## 2020-08-03 DIAGNOSIS — Z9484 Stem cells transplant status: Principal | ICD-10-CM

## 2020-08-03 DIAGNOSIS — Z7682 Awaiting organ transplant status: Principal | ICD-10-CM

## 2020-08-03 DIAGNOSIS — T865 Complications of stem cell transplant: Principal | ICD-10-CM

## 2020-08-03 DIAGNOSIS — R112 Nausea with vomiting, unspecified: Principal | ICD-10-CM

## 2020-08-03 DIAGNOSIS — D61818 Other pancytopenia: Principal | ICD-10-CM

## 2020-08-03 DIAGNOSIS — D702 Other drug-induced agranulocytosis: Principal | ICD-10-CM

## 2020-08-03 DIAGNOSIS — E8770 Fluid overload, unspecified: Principal | ICD-10-CM

## 2020-08-03 DIAGNOSIS — R7881 Bacteremia: Principal | ICD-10-CM

## 2020-08-03 DIAGNOSIS — R590 Localized enlarged lymph nodes: Principal | ICD-10-CM

## 2020-08-03 DIAGNOSIS — Z9221 Personal history of antineoplastic chemotherapy: Principal | ICD-10-CM

## 2020-08-03 DIAGNOSIS — R791 Abnormal coagulation profile: Principal | ICD-10-CM

## 2020-08-03 DIAGNOSIS — T451X5A Adverse effect of antineoplastic and immunosuppressive drugs, initial encounter: Principal | ICD-10-CM

## 2020-08-03 DIAGNOSIS — D701 Agranulocytosis secondary to cancer chemotherapy: Principal | ICD-10-CM

## 2020-08-03 DIAGNOSIS — E876 Hypokalemia: Principal | ICD-10-CM

## 2020-08-03 DIAGNOSIS — D708 Other neutropenia: Principal | ICD-10-CM

## 2020-08-03 DIAGNOSIS — D709 Neutropenia, unspecified: Principal | ICD-10-CM

## 2020-08-03 DIAGNOSIS — E039 Hypothyroidism, unspecified: Principal | ICD-10-CM

## 2020-08-03 DIAGNOSIS — I129 Hypertensive chronic kidney disease with stage 1 through stage 4 chronic kidney disease, or unspecified chronic kidney disease: Principal | ICD-10-CM

## 2020-08-03 DIAGNOSIS — N179 Acute kidney failure, unspecified: Principal | ICD-10-CM

## 2020-08-03 DIAGNOSIS — F32A Depression, unspecified: Principal | ICD-10-CM

## 2020-08-03 DIAGNOSIS — T50905A Adverse effect of unspecified drugs, medicaments and biological substances, initial encounter: Principal | ICD-10-CM

## 2020-08-03 DIAGNOSIS — F419 Anxiety disorder, unspecified: Principal | ICD-10-CM

## 2020-08-03 DIAGNOSIS — D619 Aplastic anemia, unspecified: Principal | ICD-10-CM

## 2020-08-03 DIAGNOSIS — Z794 Long term (current) use of insulin: Principal | ICD-10-CM

## 2020-08-03 DIAGNOSIS — C9111 Chronic lymphocytic leukemia of B-cell type in remission: Principal | ICD-10-CM

## 2020-08-03 DIAGNOSIS — C9110 Chronic lymphocytic leukemia of B-cell type not having achieved remission: Principal | ICD-10-CM

## 2020-08-03 DIAGNOSIS — N189 Chronic kidney disease, unspecified: Principal | ICD-10-CM

## 2020-08-03 DIAGNOSIS — B965 Pseudomonas (aeruginosa) (mallei) (pseudomallei) as the cause of diseases classified elsewhere: Principal | ICD-10-CM

## 2020-08-03 DIAGNOSIS — R5081 Fever presenting with conditions classified elsewhere: Principal | ICD-10-CM

## 2020-08-03 LAB — BPAM RBC
Blood Product Expiration Date: 202202042359
Blood Product Expiration Date: 202202072359
ISSUE DATE / TIME: 202201101302
ISSUE DATE / TIME: 202201101302
Unit Type and Rh: 9500
Unit Type and Rh: 9500

## 2020-08-03 LAB — TYPE AND SCREEN
ABO/RH(D): O NEG
Antibody Screen: NEGATIVE
Unit division: 0
Unit division: 0

## 2020-08-04 ENCOUNTER — Other Ambulatory Visit: Payer: Self-pay | Admitting: *Deleted

## 2020-08-04 DIAGNOSIS — C9110 Chronic lymphocytic leukemia of B-cell type not having achieved remission: Secondary | ICD-10-CM

## 2020-08-04 DIAGNOSIS — D696 Thrombocytopenia, unspecified: Secondary | ICD-10-CM

## 2020-08-04 DIAGNOSIS — Z8042 Family history of malignant neoplasm of prostate: Principal | ICD-10-CM

## 2020-08-04 DIAGNOSIS — Z8701 Personal history of pneumonia (recurrent): Principal | ICD-10-CM

## 2020-08-04 DIAGNOSIS — F419 Anxiety disorder, unspecified: Principal | ICD-10-CM

## 2020-08-04 DIAGNOSIS — G4700 Insomnia, unspecified: Principal | ICD-10-CM

## 2020-08-04 DIAGNOSIS — N179 Acute kidney failure, unspecified: Principal | ICD-10-CM

## 2020-08-04 DIAGNOSIS — Z48290 Encounter for aftercare following bone marrow transplant: Principal | ICD-10-CM

## 2020-08-04 DIAGNOSIS — K649 Unspecified hemorrhoids: Principal | ICD-10-CM

## 2020-08-04 DIAGNOSIS — Z79899 Other long term (current) drug therapy: Principal | ICD-10-CM

## 2020-08-04 DIAGNOSIS — E039 Hypothyroidism, unspecified: Principal | ICD-10-CM

## 2020-08-04 DIAGNOSIS — R109 Unspecified abdominal pain: Principal | ICD-10-CM

## 2020-08-04 DIAGNOSIS — D619 Aplastic anemia, unspecified: Principal | ICD-10-CM

## 2020-08-04 DIAGNOSIS — D801 Nonfamilial hypogammaglobulinemia: Principal | ICD-10-CM

## 2020-08-04 DIAGNOSIS — F32A Depression, unspecified: Principal | ICD-10-CM

## 2020-08-04 DIAGNOSIS — K5900 Constipation, unspecified: Principal | ICD-10-CM

## 2020-08-04 DIAGNOSIS — Z9221 Personal history of antineoplastic chemotherapy: Principal | ICD-10-CM

## 2020-08-04 DIAGNOSIS — B1910 Unspecified viral hepatitis B without hepatic coma: Principal | ICD-10-CM

## 2020-08-04 DIAGNOSIS — Z79890 Hormone replacement therapy (postmenopausal): Principal | ICD-10-CM

## 2020-08-04 DIAGNOSIS — M8589 Other specified disorders of bone density and structure, multiple sites: Principal | ICD-10-CM

## 2020-08-04 DIAGNOSIS — Z9484 Stem cells transplant status: Principal | ICD-10-CM

## 2020-08-04 DIAGNOSIS — C9111 Chronic lymphocytic leukemia of B-cell type in remission: Principal | ICD-10-CM

## 2020-08-04 DIAGNOSIS — R5383 Other fatigue: Principal | ICD-10-CM

## 2020-08-04 DIAGNOSIS — Z803 Family history of malignant neoplasm of breast: Principal | ICD-10-CM

## 2020-08-04 DIAGNOSIS — D61818 Other pancytopenia: Principal | ICD-10-CM

## 2020-08-04 DIAGNOSIS — Z7682 Awaiting organ transplant status: Principal | ICD-10-CM

## 2020-08-04 MED ORDER — TACROLIMUS 0.5 MG CAPSULE, IMMEDIATE-RELEASE
ORAL_CAPSULE | Freq: Two times a day (BID) | ORAL | 5 refills | 30 days | Status: CP
Start: 2020-08-04 — End: 2020-09-06

## 2020-08-04 MED ORDER — DIBUCAINE 1 % TOPICAL OINTMENT
TOPICAL | 1 refills | 0.00000 days | Status: CP | PRN
Start: 2020-08-04 — End: 2020-08-12

## 2020-08-04 NOTE — Progress Notes (Signed)
Was at [facility] today and informed she needs IV fluids and 1 unit platelets, however she did not want to stay due to wait time. Per Dr. Benay Spice: Schedule at [facility] for 08/05/20. High priority scheduling message placed and notified blood bank that platelets will be needed tomorrow.

## 2020-08-05 ENCOUNTER — Inpatient Hospital Stay: Payer: BC Managed Care – PPO

## 2020-08-05 ENCOUNTER — Other Ambulatory Visit: Payer: Self-pay | Admitting: Nurse Practitioner

## 2020-08-05 ENCOUNTER — Other Ambulatory Visit: Payer: Self-pay

## 2020-08-05 ENCOUNTER — Other Ambulatory Visit: Payer: Self-pay | Admitting: *Deleted

## 2020-08-05 ENCOUNTER — Telehealth: Payer: Self-pay

## 2020-08-05 ENCOUNTER — Encounter: Payer: Self-pay | Admitting: *Deleted

## 2020-08-05 DIAGNOSIS — C9110 Chronic lymphocytic leukemia of B-cell type not having achieved remission: Secondary | ICD-10-CM

## 2020-08-05 DIAGNOSIS — D696 Thrombocytopenia, unspecified: Secondary | ICD-10-CM

## 2020-08-05 DIAGNOSIS — D649 Anemia, unspecified: Secondary | ICD-10-CM

## 2020-08-05 DIAGNOSIS — Z452 Encounter for adjustment and management of vascular access device: Secondary | ICD-10-CM

## 2020-08-05 DIAGNOSIS — Z95828 Presence of other vascular implants and grafts: Secondary | ICD-10-CM

## 2020-08-05 LAB — CBC WITH DIFFERENTIAL (CANCER CENTER ONLY)
Abs Immature Granulocytes: 0 10*3/uL (ref 0.00–0.07)
Band Neutrophils: 3 %
Basophils Absolute: 0 10*3/uL (ref 0.0–0.1)
Basophils Relative: 4 %
Eosinophils Absolute: 0 10*3/uL (ref 0.0–0.5)
Eosinophils Relative: 0 %
HCT: 22.9 % — ABNORMAL LOW (ref 36.0–46.0)
Hemoglobin: 8.2 g/dL — ABNORMAL LOW (ref 12.0–15.0)
Lymphocytes Relative: 26 %
Lymphs Abs: 0.2 10*3/uL — ABNORMAL LOW (ref 0.7–4.0)
MCH: 30.6 pg (ref 26.0–34.0)
MCHC: 35.8 g/dL (ref 30.0–36.0)
MCV: 85.4 fL (ref 80.0–100.0)
Metamyelocytes Relative: 5 %
Monocytes Absolute: 0.2 10*3/uL (ref 0.1–1.0)
Monocytes Relative: 32 %
Myelocytes: 4 %
Neutro Abs: 0.2 10*3/uL — CL (ref 1.7–7.7)
Neutrophils Relative %: 30 %
Platelet Count: 6 10*3/uL — CL (ref 150–400)
RBC: 2.68 MIL/uL — ABNORMAL LOW (ref 3.87–5.11)
RDW: 14.6 % (ref 11.5–15.5)
Smear Review: DECREASED
WBC Count: 0.7 10*3/uL — CL (ref 4.0–10.5)
nRBC: 0 % (ref 0.0–0.2)

## 2020-08-05 LAB — TYPE AND SCREEN
ABO/RH(D): O NEG
Antibody Screen: NEGATIVE

## 2020-08-05 LAB — PLATELET COUNT (CANCER CENTER ONLY): Platelet Count: 12 10*3/uL — ABNORMAL LOW (ref 150–400)

## 2020-08-05 MED ORDER — SODIUM CHLORIDE 0.9% FLUSH
10.0000 mL | INTRAVENOUS | Status: DC | PRN
  Administered 2020-08-05: 10 mL
  Filled 2020-08-05: qty 10

## 2020-08-05 MED ORDER — SODIUM CHLORIDE 0.9% FLUSH
10.0000 mL | INTRAVENOUS | Status: AC | PRN
  Administered 2020-08-05: 10 mL
  Filled 2020-08-05: qty 10

## 2020-08-05 MED ORDER — HEPARIN SOD (PORK) LOCK FLUSH 100 UNIT/ML IV SOLN
250.0000 [IU] | INTRAVENOUS | Status: AC | PRN
  Administered 2020-08-05: 250 [IU]
  Filled 2020-08-05: qty 5

## 2020-08-05 MED ORDER — SODIUM CHLORIDE 0.9 % IV SOLN
INTRAVENOUS | Status: AC
  Filled 2020-08-05 (×2): qty 250

## 2020-08-05 MED ORDER — SODIUM CHLORIDE 0.9% IV SOLUTION
250.0000 mL | Freq: Once | INTRAVENOUS | Status: DC
  Filled 2020-08-05: qty 250

## 2020-08-05 MED ORDER — SODIUM CHLORIDE 0.9% IV SOLUTION
250.0000 mL | Freq: Once | INTRAVENOUS | Status: AC
  Administered 2020-08-05: 250 mL via INTRAVENOUS
  Filled 2020-08-05: qty 250

## 2020-08-05 MED ORDER — PROCHLORPERAZINE MALEATE 5 MG TABLET
ORAL_TABLET | Freq: Four times a day (QID) | ORAL | 1 refills | 8 days | Status: CP | PRN
Start: 2020-08-05 — End: 2020-09-03

## 2020-08-05 NOTE — Telephone Encounter (Signed)
CRITICAL VALUE STICKER  CRITICAL VALUE: WBC 0.7 and platelets 6  RECEIVER (on-site recipient of call): Wendall Mola, RN-carrying lab phone  DATE & TIME NOTIFIED: 07/26/20 @ 0909  MESSENGER (representative from lab):Jay S.   MD NOTIFIED: Merceda Elks, RN for Dr. Benay Spice. Manuela Schwartz will make Dr. Benay Spice aware.   TIME OF NOTIFICATION: 0915  RESPONSE: Acknowledged. Patient scheduled for platelets today.

## 2020-08-05 NOTE — Progress Notes (Signed)
30 minute post platelet count 12,000. Will recheck on Friday.

## 2020-08-05 NOTE — Progress Notes (Signed)
CRITICAL VALUE STICKER  CRITICAL VALUE: platelet 6,000  RECEIVER (on-site recipient of call): Lanelle Bal, [place] NOTIFIED: 06/05/21 @ 0909  MESSENGER (representative from lab):Jay  MD NOTIFIED: Dr. Benay Spice   TIME OF NOTIFICATION: 0915  RESPONSE: give platelets today as scheduled and check 30 minute post count.

## 2020-08-05 NOTE — Patient Instructions (Signed)
Thrombocytopenia Thrombocytopenia is a condition in which you have a low number of platelets in your blood. Platelets are also called thrombocytes. Platelets are tiny cells in the blood. When you bleed, they clump together at the cut or injury to stop the bleeding. This is called blood clotting. Not having enough platelets can cause bleeding problems. Some cases of thrombocytopenia are mild while others are more severe. What are the causes? This condition may be caused by:  Decreased production of platelets. This may be caused by: ? Aplastic anemia. This is when your bone marrow stops making blood cells. ? Cancer in the bone marrow. ? Certain medicines, including chemotherapy. ? Infection in the bone marrow. ? Drinking a lot of alcohol.  Increased destruction of platelets. This may be caused by: ? Certain immune diseases. ? Certain medicines. ? Certain blood clotting disorders. ? Certain inherited disorders. ? Certain bleeding disorders. ? Pregnancy. ? Having an enlarged spleen (hypersplenism). In hypersplenism, the spleen gathers up platelets from circulation. This means that the platelets are not available to help with blood clotting. The spleen can be enlarged because of cirrhosis or other conditions. What are the signs or symptoms? Symptoms of this condition are the result of poor blood clotting. They will vary depending on how low the platelet counts are. Symptoms may include:  Abnormal bleeding.  Nosebleeds.  Heavy menstrual periods.  Blood in the urine or stool (feces).  A purplish discoloration in the skin (purpura).  Bruising.  A rash that looks like pinpoint, purplish-red spots (petechiae) on the skin and mucous membranes. How is this diagnosed? This condition may be diagnosed with blood tests and a physical exam. Sometimes, a sample of bone marrow may be removed to look for the original cells (megakaryocytes) that make platelets. Other tests may be needed depending on  the cause.   How is this treated? Treatment for this condition depends on the cause. Treatment options may include:  Treatment of another condition that is causing the low platelet count.  Medicines to help protect your platelets from being destroyed.  A replacement (transfusion) of platelets to stop or prevent bleeding.  Surgery to remove the spleen. Follow these instructions at home: Activity  Avoid activities that could cause injury or bruising, and follow instructions about how to prevent falls.  Take extra care not to cut yourself when you shave or when you use scissors, needles, knives, and other tools.  Take extra care to protect yourself from burns when ironing or cooking. General instructions  Check your skin and the inside of your mouth for bruising or bleeding as told by your health care provider.  Check your spit (sputum), urine, and stool for blood as told by your health care provider.  Do not drink alcohol.  Take over-the-counter and prescription medicines only as told by your health care provider.  Do not take any medicines that have aspirin or NSAIDs in them. These medicines can thin your blood and cause you to bleed more easily.  Tell all your health care providers, including dentists and eye doctors, about your condition.   Contact a health care provider if you have:  Unexplained bruising. Get help right away if you have:  Active bleeding from anywhere on your body.  Blood in your sputum, urine, or stool. Summary  Thrombocytopenia is a condition in which you have a low number of platelets in your blood.  Platelets are needed for blood clotting.  Symptoms of this condition are the result of poor blood  clotting and may include abnormal bleeding, nosebleeds, and bruising.  This condition may be diagnosed with blood tests and a physical exam.  Treatment for this condition depends on the cause. This information is not intended to replace advice given to  you by your health care provider. Make sure you discuss any questions you have with your health care provider. Document Revised: 04/11/2018 Document Reviewed: 04/11/2018 Elsevier Patient Education  [employer].

## 2020-08-06 ENCOUNTER — Inpatient Hospital Stay: Payer: BC Managed Care – PPO

## 2020-08-06 ENCOUNTER — Other Ambulatory Visit: Payer: Self-pay | Admitting: *Deleted

## 2020-08-06 ENCOUNTER — Telehealth: Payer: Self-pay

## 2020-08-06 ENCOUNTER — Other Ambulatory Visit: Payer: Self-pay

## 2020-08-06 DIAGNOSIS — D696 Thrombocytopenia, unspecified: Secondary | ICD-10-CM

## 2020-08-06 DIAGNOSIS — D649 Anemia, unspecified: Secondary | ICD-10-CM

## 2020-08-06 DIAGNOSIS — C9110 Chronic lymphocytic leukemia of B-cell type not having achieved remission: Secondary | ICD-10-CM | POA: Diagnosis not present

## 2020-08-06 DIAGNOSIS — Z452 Encounter for adjustment and management of vascular access device: Secondary | ICD-10-CM

## 2020-08-06 DIAGNOSIS — Z95828 Presence of other vascular implants and grafts: Secondary | ICD-10-CM

## 2020-08-06 LAB — CBC WITH DIFFERENTIAL (CANCER CENTER ONLY)
Abs Immature Granulocytes: 0.1 10*3/uL — ABNORMAL HIGH (ref 0.00–0.07)
Band Neutrophils: 7 %
Basophils Absolute: 0 10*3/uL (ref 0.0–0.1)
Basophils Relative: 0 %
Eosinophils Absolute: 0 10*3/uL (ref 0.0–0.5)
Eosinophils Relative: 0 %
HCT: 20.9 % — ABNORMAL LOW (ref 36.0–46.0)
Hemoglobin: 7.3 g/dL — ABNORMAL LOW (ref 12.0–15.0)
Lymphocytes Relative: 27 %
Lymphs Abs: 0.2 10*3/uL — ABNORMAL LOW (ref 0.7–4.0)
MCH: 30.3 pg (ref 26.0–34.0)
MCHC: 34.9 g/dL (ref 30.0–36.0)
MCV: 86.7 fL (ref 80.0–100.0)
Metamyelocytes Relative: 10 %
Monocytes Absolute: 0.2 10*3/uL (ref 0.1–1.0)
Monocytes Relative: 24 %
Neutro Abs: 0.3 10*3/uL — CL (ref 1.7–7.7)
Neutrophils Relative %: 32 %
Platelet Count: 11 10*3/uL — ABNORMAL LOW (ref 150–400)
RBC: 2.41 MIL/uL — ABNORMAL LOW (ref 3.87–5.11)
RDW: 14.2 % (ref 11.5–15.5)
WBC Count: 0.7 10*3/uL — CL (ref 4.0–10.5)
nRBC: 0 % (ref 0.0–0.2)

## 2020-08-06 LAB — PREPARE PLATELET PHERESIS: Unit division: 0

## 2020-08-06 LAB — BPAM PLATELET PHERESIS
Blood Product Expiration Date: 202201132359
ISSUE DATE / TIME: 202201130929
Unit Type and Rh: 600

## 2020-08-06 LAB — PREPARE RBC (CROSSMATCH)

## 2020-08-06 MED ORDER — HEPARIN SOD (PORK) LOCK FLUSH 100 UNIT/ML IV SOLN
250.0000 [IU] | Freq: Once | INTRAVENOUS | Status: AC
  Administered 2020-08-06: 250 [IU]
  Filled 2020-08-06: qty 5

## 2020-08-06 MED ORDER — SODIUM CHLORIDE 0.9% FLUSH
10.0000 mL | INTRAVENOUS | Status: DC | PRN
  Administered 2020-08-06: 10 mL
  Filled 2020-08-06: qty 10

## 2020-08-06 MED ORDER — HEPARIN SOD (PORK) LOCK FLUSH 100 UNIT/ML IV SOLN
500.0000 [IU] | Freq: Once | INTRAVENOUS | Status: DC | PRN
  Filled 2020-08-06: qty 5

## 2020-08-06 NOTE — Progress Notes (Signed)
Blood bank notified of orders to prepare platelets and 1 unit blood for tomorrow.

## 2020-08-06 NOTE — Telephone Encounter (Signed)
CRITICAL VALUE STICKER  CRITICAL VALUE: ANC = 0.3  RECEIVER (on-site recipient of call): Yetta Glassman, [place] NOTIFIED: 08/06/20 at 11:05am  MESSENGER (representative from lab): Pam  MD NOTIFIED: Dr. Benay Spice  TIME OF NOTIFICATION: 08/06/20 at 11:05am  RESPONSE: Notification given to Manuela Schwartz, RN for follow-up with provider.

## 2020-08-06 NOTE — Telephone Encounter (Signed)
CRITICAL VALUE STICKER  CRITICAL VALUE: WBC = 0.7  RECEIVER (on-site recipient of call): Yetta Glassman, [place] NOTIFIED: 08/06/20 at 10:18am  MESSENGER (representative from lab): Pam  MD NOTIFIED: Dr. Benay Spice  TIME OF NOTIFICATION: 08/06/20 at 10:20am  RESPONSE: Notification given to Manuela Schwartz, RN for follow-up with provider.

## 2020-08-06 NOTE — Progress Notes (Signed)
CRITICAL VALUE STICKER  CRITICAL VALUE: WBC 0.7, platelets 11, Hgb 7.3  RECEIVER (on-site recipient of call): Jalasia Eskridge,RN  DATE & TIME NOTIFIED: 08/06/20 @ 1020  MESSENGER (representative from lab): Pam  MD NOTIFIED: Dr. Benay Spice  TIME OF NOTIFICATION:1040  RESPONSE: Transfuse 1 unit blood and platelets today or tomorrow. High priority scheduling message sent.

## 2020-08-07 ENCOUNTER — Other Ambulatory Visit: Payer: Self-pay

## 2020-08-07 ENCOUNTER — Inpatient Hospital Stay: Payer: BC Managed Care – PPO

## 2020-08-07 DIAGNOSIS — D696 Thrombocytopenia, unspecified: Secondary | ICD-10-CM

## 2020-08-07 DIAGNOSIS — D649 Anemia, unspecified: Secondary | ICD-10-CM

## 2020-08-07 DIAGNOSIS — C9110 Chronic lymphocytic leukemia of B-cell type not having achieved remission: Secondary | ICD-10-CM

## 2020-08-07 MED ORDER — SODIUM CHLORIDE 0.9% FLUSH
10.0000 mL | INTRAVENOUS | Status: AC | PRN
  Administered 2020-08-07: 10 mL
  Filled 2020-08-07: qty 10

## 2020-08-07 MED ORDER — HEPARIN SOD (PORK) LOCK FLUSH 100 UNIT/ML IV SOLN
250.0000 [IU] | INTRAVENOUS | Status: AC | PRN
  Administered 2020-08-07: 250 [IU]
  Filled 2020-08-07: qty 5

## 2020-08-07 MED ORDER — SODIUM CHLORIDE 0.9% IV SOLUTION
250.0000 mL | Freq: Once | INTRAVENOUS | Status: AC
  Administered 2020-08-07: 250 mL via INTRAVENOUS
  Filled 2020-08-07: qty 250

## 2020-08-07 NOTE — Patient Instructions (Signed)

## 2020-08-08 LAB — BPAM PLATELET PHERESIS
Blood Product Expiration Date: 202201162359
Blood Product Expiration Date: 202201172359
ISSUE DATE / TIME: 202201151018
Unit Type and Rh: 5100
Unit Type and Rh: 7300

## 2020-08-08 LAB — PREPARE PLATELET PHERESIS
Unit division: 0
Unit division: 0

## 2020-08-08 LAB — TYPE AND SCREEN
ABO/RH(D): O NEG
Antibody Screen: NEGATIVE
Unit division: 0

## 2020-08-08 LAB — BPAM RBC
Blood Product Expiration Date: 202202112359
ISSUE DATE / TIME: 202201150922
Unit Type and Rh: 9500

## 2020-08-09 ENCOUNTER — Inpatient Hospital Stay: Payer: BC Managed Care – PPO

## 2020-08-09 ENCOUNTER — Inpatient Hospital Stay: Payer: BC Managed Care – PPO | Admitting: Nurse Practitioner

## 2020-08-10 ENCOUNTER — Other Ambulatory Visit: Payer: Self-pay

## 2020-08-10 ENCOUNTER — Encounter: Payer: Self-pay | Admitting: Nurse Practitioner

## 2020-08-10 ENCOUNTER — Inpatient Hospital Stay: Payer: BC Managed Care – PPO

## 2020-08-10 ENCOUNTER — Inpatient Hospital Stay (HOSPITAL_BASED_OUTPATIENT_CLINIC_OR_DEPARTMENT_OTHER): Payer: BC Managed Care – PPO | Admitting: Nurse Practitioner

## 2020-08-10 VITALS — BP 131/73 | HR 92 | Temp 97.9°F | Resp 17 | Ht 66.0 in | Wt 111.9 lb

## 2020-08-10 DIAGNOSIS — D696 Thrombocytopenia, unspecified: Secondary | ICD-10-CM

## 2020-08-10 DIAGNOSIS — Z452 Encounter for adjustment and management of vascular access device: Secondary | ICD-10-CM

## 2020-08-10 DIAGNOSIS — D649 Anemia, unspecified: Secondary | ICD-10-CM

## 2020-08-10 DIAGNOSIS — C9110 Chronic lymphocytic leukemia of B-cell type not having achieved remission: Secondary | ICD-10-CM

## 2020-08-10 DIAGNOSIS — Z95828 Presence of other vascular implants and grafts: Secondary | ICD-10-CM

## 2020-08-10 LAB — PREPARE RBC (CROSSMATCH)

## 2020-08-10 LAB — CBC WITH DIFFERENTIAL (CANCER CENTER ONLY)
Abs Immature Granulocytes: 0.05 10*3/uL (ref 0.00–0.07)
Basophils Absolute: 0 10*3/uL (ref 0.0–0.1)
Basophils Relative: 0 %
Eosinophils Absolute: 0 10*3/uL (ref 0.0–0.5)
Eosinophils Relative: 0 %
HCT: 20.8 % — ABNORMAL LOW (ref 36.0–46.0)
Hemoglobin: 7.5 g/dL — ABNORMAL LOW (ref 12.0–15.0)
Immature Granulocytes: 9 %
Lymphocytes Relative: 19 %
Lymphs Abs: 0.1 10*3/uL — ABNORMAL LOW (ref 0.7–4.0)
MCH: 29.9 pg (ref 26.0–34.0)
MCHC: 36.1 g/dL — ABNORMAL HIGH (ref 30.0–36.0)
MCV: 82.9 fL (ref 80.0–100.0)
Monocytes Absolute: 0.1 10*3/uL (ref 0.1–1.0)
Monocytes Relative: 21 %
Neutro Abs: 0.3 10*3/uL — CL (ref 1.7–7.7)
Neutrophils Relative %: 51 %
Platelet Count: 5 10*3/uL — CL (ref 150–400)
RBC: 2.51 MIL/uL — ABNORMAL LOW (ref 3.87–5.11)
RDW: 13.6 % (ref 11.5–15.5)
WBC Count: 0.5 10*3/uL — CL (ref 4.0–10.5)
nRBC: 0 % (ref 0.0–0.2)

## 2020-08-10 LAB — PLATELET COUNT: Platelets: 19 10*3/uL — ABNORMAL LOW (ref 150–400)

## 2020-08-10 LAB — SAMPLE TO BLOOD BANK

## 2020-08-10 MED ORDER — SODIUM CHLORIDE 0.9% IV SOLUTION
250.0000 mL | Freq: Once | INTRAVENOUS | Status: AC
  Administered 2020-08-10: 250 mL via INTRAVENOUS
  Filled 2020-08-10: qty 250

## 2020-08-10 MED ORDER — HEPARIN SOD (PORK) LOCK FLUSH 100 UNIT/ML IV SOLN
250.0000 [IU] | INTRAVENOUS | Status: AC | PRN
  Administered 2020-08-10: 250 [IU]
  Filled 2020-08-10: qty 5

## 2020-08-10 MED ORDER — HEPARIN SOD (PORK) LOCK FLUSH 100 UNIT/ML IV SOLN
250.0000 [IU] | Freq: Once | INTRAVENOUS | Status: AC
Start: 2020-08-10 — End: 2020-08-10
  Administered 2020-08-10: 250 [IU]
  Filled 2020-08-10: qty 5

## 2020-08-10 MED ORDER — SODIUM CHLORIDE 0.9% FLUSH
10.0000 mL | INTRAVENOUS | Status: DC | PRN
  Administered 2020-08-10: 10 mL
  Filled 2020-08-10: qty 10

## 2020-08-10 MED ORDER — SODIUM CHLORIDE 0.9% FLUSH
3.0000 mL | INTRAVENOUS | Status: AC | PRN
  Administered 2020-08-10: 3 mL
  Filled 2020-08-10: qty 10

## 2020-08-10 NOTE — Progress Notes (Signed)
Patient stated that dressing was changed last night at home by husband. Husband does her dressing changed. Patient flushed and heparin locked

## 2020-08-10 NOTE — Progress Notes (Addendum)
[place] OFFICE PROGRESS NOTE   Diagnosis: CLL, aplastic anemia  INTERVAL HISTORY:   Meagan Walsh returns as scheduled.  She was transfused platelets 08/05/2020 and 08/07/2020.  She was transfused a unit of blood 08/07/2020.  Energy level remains very poor.  She denies bleeding.  No fever.  Some nausea/vomiting which she attributes to nighttime medications.  Painful hemorrhoids.  Objective:  Vital signs in last 24 hours:  Blood pressure 131/73, pulse 92, temperature 97.9 F (36.6 C), temperature source Tympanic, resp. rate 17, height '5\' 6"'  (1.676 m), weight 111 lb 14.4 oz (50.8 kg).    HEENT: Small ecchymosis right buccal mucosa. Resp: Lungs clear bilaterally. Cardio: Regular rate and rhythm. GI: No hepatosplenomegaly.  3 soft tender external hemorrhoids.  No perianal fluctuance. Vascular: No leg edema.  Skin: Pale. Hickman catheter without erythema   Lab Results:  Lab Results  Component Value Date   WBC 0.5 (LL) 08/10/2020   HGB 7.5 (L) 08/10/2020   HCT 20.8 (L) 08/10/2020   MCV 82.9 08/10/2020   PLT 5 (LL) 08/10/2020   NEUTROABS PENDING 08/10/2020    Imaging:  No results found.  Medications: I have reviewed the patient's current medications.  Assessment/Plan: 1. 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 Mild splenomegaly.  Ultrasound-guided biopsy of the left inguinal lymph node 09/18/2017, slightly "purulent "fluid aspirated, core biopsy is consistent with an atypical lymphoid proliferation-extensive necrosis with surrounding epithelioid histiocytes, limited intact lymphoid tissue involved with CLL  Incisional biopsy of a necrotic/purulent left inguinal lymph node on  10/01/2017-extensive necrosis with granulomatous inflammation, small amount of viable lymphoid tissue involved with CLL, AFB and fungal stains negative  Peripheral blood FISH analysis 02/05/2018-deletion 13q14, no evidence of p53 (17p13) deletion, no evidence of 11q22deletion  Bone marrow biopsy 02/26/2018-hypercellular marrow with extensive involvement by CLL, lymphocytes represent85% of all cells  Ibrutinib initiated 04/03/2018  Ibrutinib placed on hold 04/11/2018 due to onset of arthralgias  Ibrutinib resumed 04/16/2018, discontinued 04/25/2018 secondary to severe arthralgias/arthritis  Ibrutinib resumed at a dose of 140 mg daily 05/03/2018  Ibrutinib dose adjusted to 140 mg alternating with 246m 06/25/2018  Ibrutinib discontinued 07/03/2018 secondary to severe arthralgias  Acalabrutinib 08/16/2018, discontinued 11/15/2018 secondary to persistent severe transfusion dependent anemia and neutropenia/thrombocytopenia, last dose 11/14/2018  Bone marrow biopsy 11/21/2018-decreased cellularity, involvement by CLL, decreased erythroid and granulocytic precursors, decreased megakaryocytes  Cycle 1 rituximab 12/06/2018  Bone marrow biopsy 12/24/2018 at [facility]-hypocellular bone marrow (10%) involved by CLL, representing 50% of marrow cellularity; markedly decreased trilineage hematopoiesis including essentially absent erythropoiesis  Bone marrow biopsy 03/06/2027 [facility]-hypocellular bone marrow (20%) with marked reduction of maturing hematopoietic elements; numerous lymphoid aggregates consistent with involvement by CLL, representing approximately 50% of marrow cellularity by flow cytometry  [facility] by prednisone taper  Eltrombopag beginning 07/05/2019  Cyclosporine discontinued 07/08/2019  Tacrolimus 07/10/2019, discontinued 09/24/2019  [facility] (<5% cellular) essentially devoid of hematopoietic elements;  Persistent chronic lymphocytic leukemia, representing 23% of marrow cellularity by flow cytometric analysis; Routine cytogenetic analysis is pending.  CT CAP 10/10/2019 showed no acute process or evidence of active lymphoma/leukemia within the chest, abdomen, or pelvis. Resection or resolution of previously described necrotic left inguinal node  Tacrolimus resumed 10/17/2019, discontinued on 10/28/2019  and resumed at a dose of 0.5 mg twice daily on 10/29/2019, tacrolimus escalated to 1 mg twice daily 11/14/2019  Bone marrow biopsy at [facility] 12/26/1998 21-20% cellular marrow, CLL involving approximately 20% of the cellular component  Eltrombopag discontinued week of 12/29/2019  01/01/2020 -induction chemotherapy with bendamustine, fludarabine, rituximab, tacrolimus started day -3 and methotrexate given on day +1, +3, +6, and +11  Bone marrow biopsy10/13/2021-30% cellular marrow with adequate megakaryocytes, increased erythropoiesis and diminished granulopoiesis, 3-5% blasts, cells are 94% donor origin  2.Hypothyroidism 3.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 Augmentin prescribed. 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 Pancytopenia secondary to CLL and a hypocellular bone marrow  G-CSF and Nplate started 1/61/0960, G-CSF changed to daily beginning 12/17/2018; G-CSF discontinued 12/31/2018, last Nplate 01/15/2019  Began prednisone 60 mg daily 01/11/19, tolerating moderately well except jitteriness, irritability, and difficulty sleeping. Tapered to 40 mg daily x1 week starting  01/24/19, reduce by 10 mg each week until discontinued. Prednisone discontinued 02/20/2019.  Promacta started 01/20/2019, dose increased to 100 mg daily 02/04/2019; dose increased to 150 mg daily 02/21/2019  IVIG daily for 2 days beginning 02/25/2019  9. 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 No lymphadenopathy or splenomegaly. Exjade beginning 06/28/2019. 13. Elevated creatinine-cyclosporine toxicity?Elevated 10/28/2019 with hyperkalemia-tacrolimus induced? 14.Vaginal bleeding beginning 07/13/2019-Mirena device removed, uterine fibroids and possible endometrial fluid or thickening in the fundus per CT CAP on 10/10/19 15.Elevated lithium level 08/08/2019, lithium toxicity?-Lithium held 08/08/2019-08/12/2019, then resumed at a lower dose 16. Deconditioning, secondary to n/v/d 17. Orthostatic hypotension 09/15/19,received500 cc NS in clinic  Disposition: Meagan Walsh continues to have severe pancytopenia.  Hemoglobin today is 7.5, platelets 5000, ANC 0.3.  She will receive a unit of blood and a unit of platelets today.  She is scheduled to be admitted at [facility] later this week.  We are available to see her in the interim with any problems.  She will continue symptomatic care of the hemorrhoids with sitz baths, external hemorrhoid cream.  She will continue a laxative regimen to keep the stools soft.  Patient seen with Dr. Benay Spice.    Ned Card ANP/GNP-BC   08/10/2020  11:17 AM  This was a shared visit with Ned Card.  Ms. Bihl has persistent severe pancytopenia.  She is scheduled for admission to [facility] later this week for a second allogeneic transplant.  We continue supportive care with platelet and red cell transfusions as needed.  She will receive platelets  and red cells today.  She was last transfused on 08/07/2020.  We will plan to see her after recovery from the allogeneic transplant.  We are available to see her sooner as needed.  I performed medical decision making at today's visit.  [name], MD

## 2020-08-10 NOTE — Progress Notes (Signed)
Transfusion orders

## 2020-08-10 NOTE — Progress Notes (Signed)
CRITICAL VALUE STICKER  CRITICAL VALUE:wbc 0.5/platelets 5  RECEIVER (on-site recipient of call):Beth  DATE & TIME NOTIFIED: 08/10/20 1046  MESSENGER (representative from lab):Pam  MD NOTIFIED: Ned Card  TIME OF NOTIFICATION:1047  RESPONSE: MD aware

## 2020-08-10 NOTE — Patient Instructions (Addendum)
Pancytopenia Pancytopenia is a condition in which a person has an abnormally low amount (deficiency) of the following blood cells:  Red blood cells (RBCs). Having too few RBCs is called anemia.  White blood cells (WBCs). Having too few WBCs is called leukopenia.  Cells that help the blood clot (platelets). Having too few platelets is called thrombocytopenia. Cells that become blood cells (stem cells) are made in the soft tissue inside the bones (bone marrow). All blood cells have a limited lifespan. Blood cells are constantly replaced with new blood cells from the bone marrow. Pancytopenia can be caused by any condition or disease that:  Destroys the ability of bone marrow to make blood cells.  Causes bone marrow to make blood cells that cannot survive after they leave the bone marrow. What are the causes? There are many possible causes of this condition. In some cases, the cause is not known. Common causes of the condition include:  A disease that causes bone marrow to make immature blood cells (megaloblastic anemia).  A blood disorder that makes bone marrow unable to produce enough new RBCs (aplastic anemia or bone marrow failure).  An enlarged spleen (hypersplenism). An enlarged spleen can trap blood cells and destroy them faster than they can be replaced.  Inherited diseases of the blood or bone marrow.  Cancers that affect bone marrow.  Certain medicines, such as: ? Chemotherapy. ? Medicines that reduce the activity of the immune system (immunosuppressant medicines).  Exposure to radiation.  Severe infections. What increases the risk? You are more likely to develop this condition if:  You are 30?61 years old.  You are female.  You have a family history of a blood or bone marrow disease.  You have certain conditions, such as: ? Alcohol use disorder. ? HIV (human immunodeficiency virus) or AIDS (acquired immunodeficiency syndrome). ? Cancer. ? Conditions in which the  body's disease-fighting system attacks normal tissues (autoimmune diseases). What are the signs or symptoms? Symptoms of this condition vary depending on the cause and may include:  Anemia.  Weakness.  Shortness of breath.  Unusual bruising and bleeding.  Frequent infections.  Fatigue.  Fever.  Pale skin.  Bone pain.  Night sweats.  Weight loss.  Headache.  Dizziness.  Feeling unusually cold. How is this diagnosed? This condition may be diagnosed based on:  Your symptoms.  Your medical history.  A physical exam.  Tests. These may include: ? Removal of a sample of bone marrow to be examined under a microscope (biopsy). This is done by inserting a needle into a bone to remove fluid and cells (aspiration). ? A complete blood count (CBC). This is a group of tests that measures characteristics of WBCs, RBCs, and platelets. ? A peripheral blood smear. This test examines your blood under a microscope to provide information about drugs and diseases that affect RBCs, WBCs, and platelets. ? Reticulocyte count. This is a test that measures the amount of new or immature RBCs (reticulocytes) that are made by your bone marrow. ? Imaging studies of your spleen or liver, such as X-rays. ? A test to measure your vitamin B12 level. ? Tests for viruses. How is this treated? Treatment for this condition depends on the cause. Treatment may include:  Immunosuppressant medicines.  Antibiotic medicine.  Vitamin B12. This may be given as a treatment for megaloblastic anemia.  Medicines that help the bone marrow make blood cells (bone marrow stimulating drugs).  A bone marrow transplant.  Receiving donated blood through an IV (  blood transfusion).  A procedure to remove your spleen (splenectomy). This may be done as a treatment for hypersplenism. Follow these instructions at home: Caring for your body  Wash your hands often with soap and water. If soap and water are not  available, use hand sanitizer.  Brush your teeth twice a day, and floss at least once a day. It is recommended that you visit the dentist every 6 months.  Stay up to date on your vaccinations, including a yearly (annual) flu shot. Ask your health care provider which vaccines you should get. These may include a pneumonia vaccine.      Medicines  Take over-the-counter and prescription medicines only as told by your health care provider.  If you were prescribed an antibiotic medicine, take it as told by your health care provider. Do not stop taking the antibiotic even if you start to feel better. Lifestyle  Do not participate in contact sports or dangerous activities. Ask your health care provider what activities are safe for you.  During cold and flu season, avoid crowded places and avoid contact with people who are sick. Flu season is typically between the months of October and May. General instructions  Work with your health care provider to manage your condition and educate yourself about your condition.  Follow food safety recommendations as told by your health care provider.  Keep all follow-up visits as told by your health care provider. This is important. Contact a health care provider if you:  Have a fever.  Bruise or bleed easily.  Are dizzy.  Feel unusually weak or tired. Get help right away if you have:  Bleeding that does not stop.  Wheezing or shortness of breath.  Chest pain. These symptoms may represent a serious problem that is an emergency. Do not wait to see if the symptoms will go away. Get medical help right away. Call your local emergency services (911 in the [place] Do not drive yourself to the hospital. Summary  Pancytopenia is a condition in which a person has an abnormally low amount of red blood cells, white blood cells, and platelets.  There are many possible causes of this condition.  Treatment for this condition depends on the cause.  Stay up  to date on your vaccinations.  Do not participate in contact sports or dangerous activities. Ask your health care provider what activities are safe for you. This information is not intended to replace advice given to you by your health care provider. Make sure you discuss any questions you have with your health care provider. Document Revised: 04/15/2018 Document Reviewed: 04/15/2018 Elsevier Patient Education  2021 [place].   Platelet Transfusion A platelet transfusion is a procedure in which you receive donated platelets through an IV. Platelets are tiny pieces of blood cells. When you get an injury, platelets clump together in the area to form a blood clot. This helps stop bleeding and is the beginning of the healing process. If you have too few platelets, your blood may have trouble clotting. This may cause you to bleed and bruise very easily. You may need a platelet transfusion if you have a condition that causes a low number of platelets (thrombocytopenia). A platelet transfusion may be used to stop or prevent excessive bleeding. Tell a health care provider about:  Any reactions you have had during previous transfusions.  Any allergies you have.  All medicines you are taking, including vitamins, herbs, eye drops, creams, and over-the-counter medicines.  Any blood disorders you  have.  Any surgeries you have had.  Any medical conditions you have.  Whether you are pregnant or may be pregnant. What are the risks? Generally, this is a safe procedure. However, problems may occur, including:  Fever.  Infection.  Allergic reaction to the donor platelets.  Your body's disease-fighting system (immune system) attacking the donor platelets (hemolytic reaction). This is rare.  A rare reaction that causes lung damage (transfusion-related acute lung injury). What happens before the procedure? Medicines  Ask your health care provider about: ? Changing or stopping your regular  medicines. This is especially important if you are taking diabetes medicines or blood thinners. ? Taking medicines such as aspirin and ibuprofen. These medicines can thin your blood. Do not take these medicines unless your health care provider tells you to take them. ? Taking over-the-counter medicines, vitamins, herbs, and supplements. General instructions  You will have a blood test to determine your blood type. Your blood type determines what kind of platelets you will be given.  Follow instructions from your health care provider about eating or drinking restrictions.  If you have had an allergic reaction to a transfusion in the past, you may be given medicine to help prevent a reaction.  Your temperature, blood pressure, pulse, and breathing will be monitored. What happens during the procedure?  An IV will be inserted into one of your veins.  For your safety, two health care providers will verify your identity along with the donor platelets about to be infused.  A bag of donor platelets will be connected to your IV. The platelets will flow into your bloodstream. This usually takes 30-60 minutes.  Your temperature, blood pressure, pulse, and breathing will be monitored during the transfusion. This helps detect early signs of any reaction.  You will also be monitored for other symptoms that may indicate a reaction, including chills, hives, or itching.  If you have signs of a reaction at any time, your transfusion will be stopped, and you may be given medicine to help manage the reaction.  When your transfusion is complete, your IV will be removed.  Pressure may be applied to the IV site for a few minutes to stop any bleeding.  The IV site will be covered with a bandage (dressing). The procedure may vary among health care providers and hospitals.   What happens after the procedure?  Your blood pressure, temperature, pulse, and breathing will be monitored until you leave the  hospital or clinic.  You may have some bruising and soreness at your IV site. Follow these instructions at home: Medicines  Take over-the-counter and prescription medicines only as told by your health care provider.  Talk with your health care provider before you take any medicines that contain aspirin or NSAIDs. These medicines increase your risk for dangerous bleeding. General instructions  Change or remove your dressing as told by your health care provider.  Return to your normal activities as told by your health care provider. Ask your health care provider what activities are safe for you.  Do not take baths, swim, or use a hot tub until your health care provider approves. Ask your health care provider if you may take showers.  Check your IV site every day for signs of infection. Check for: ? Redness, swelling, or pain. ? Fluid or blood. If fluid or blood drains from your IV site, use your hands to press down firmly on a bandage covering the area for a minute or two. Doing this should  stop the bleeding. ? Warmth. ? Pus or a bad smell.  Keep all follow-up visits as told by your health care provider. This is important. Contact a health care provider if you have:  A headache that does not go away with medicine.  Hives, rash, or itchy skin.  Nausea or vomiting.  Unusual tiredness or weakness.  Signs of infection at your IV site. Get help right away if:  You have a fever or chills.  You urinate less often than usual.  Your urine is darker colored than normal.  You have any of the following: ? Trouble breathing. ? Pain in your back, abdomen, or chest. ? Cool, clammy skin. ? A fast heartbeat. Summary  Platelets are tiny pieces of blood cells that clump together to form a blood clot when you have an injury. If you have too few platelets, your blood may have trouble clotting.  A platelet transfusion is a procedure in which you receive donated platelets through an  IV.  A platelet transfusion may be used to stop or prevent excessive bleeding.  After the procedure, check your IV site every day for signs of infection, including redness, swelling, pain, or warmth. This information is not intended to replace advice given to you by your health care provider. Make sure you discuss any questions you have with your health care provider. Document Revised: 08/15/2017 Document Reviewed: 08/15/2017 Elsevier Patient Education  2021 [place].   Blood Transfusion, Adult, Care After This sheet gives you information about how to care for yourself after your procedure. Your doctor may also give you more specific instructions. If you have problems or questions, contact your doctor. What can I expect after the procedure? After the procedure, it is common to have:  Bruising and soreness at the IV site.  A fever or chills on the day of the procedure. This may be your body's response to the new blood cells received.  A headache. Follow these instructions at home: Insertion site care  Follow instructions from your doctor about how to take care of your insertion site. This is where an IV tube was put into your vein. Make sure you: ? Wash your hands with soap and water before and after you change your bandage (dressing). If you cannot use soap and water, use hand sanitizer. ? Change your bandage as told by your doctor.  Check your insertion site every day for signs of infection. Check for: ? Redness, swelling, or pain. ? Bleeding from the site. ? Warmth. ? Pus or a bad smell.      General instructions  Take over-the-counter and prescription medicines only as told by your doctor.  Rest as told by your doctor.  Go back to your normal activities as told by your doctor.  Keep all follow-up visits as told by your doctor. This is important. Contact a doctor if:  You have itching or red, swollen areas of skin (hives).  You feel worried or nervous  (anxious).  You feel weak after doing your normal activities.  You have redness, swelling, warmth, or pain around the insertion site.  You have blood coming from the insertion site, and the blood does not stop with pressure.  You have pus or a bad smell coming from the insertion site. Get help right away if:  You have signs of a serious reaction. This may be coming from an allergy or the body's defense system (immune system). Signs include: ? Trouble breathing or shortness of breath. ? Swelling  of the face or feeling warm (flushed). ? Fever or chills. ? Head, chest, or back pain. ? Dark pee (urine) or blood in the pee. ? Widespread rash. ? Fast heartbeat. ? Feeling dizzy or light-headed. You may receive your blood transfusion in an outpatient setting. If so, you will be told whom to contact to report any reactions. These symptoms may be an emergency. Do not wait to see if the symptoms will go away. Get medical help right away. Call your local emergency services (911 in the [place] Do not drive yourself to the hospital. Summary  Bruising and soreness at the IV site are common.  Check your insertion site every day for signs of infection.  Rest as told by your doctor. Go back to your normal activities as told by your doctor.  Get help right away if you have signs of a serious reaction. This information is not intended to replace advice given to you by your health care provider. Make sure you discuss any questions you have with your health care provider. Document Revised: 01/02/2019 Document Reviewed: 01/02/2019 Elsevier Patient Education  [place].

## 2020-08-11 LAB — PREPARE PLATELET PHERESIS: Unit division: 0

## 2020-08-11 LAB — BPAM PLATELET PHERESIS
Blood Product Expiration Date: 202201192359
ISSUE DATE / TIME: 202201181235
Unit Type and Rh: 5100

## 2020-08-11 LAB — BPAM RBC
Blood Product Expiration Date: 202201312359
ISSUE DATE / TIME: 202201181453
Unit Type and Rh: 9500

## 2020-08-11 LAB — TYPE AND SCREEN
ABO/RH(D): O NEG
Antibody Screen: NEGATIVE
Unit division: 0

## 2020-08-12 ENCOUNTER — Encounter: Admit: 2020-08-12 | Discharge: 2020-08-13 | Payer: PRIVATE HEALTH INSURANCE

## 2020-08-12 ENCOUNTER — Encounter
Admit: 2020-08-12 | Discharge: 2020-08-13 | Payer: PRIVATE HEALTH INSURANCE | Attending: Physician Assistant | Primary: Physician Assistant

## 2020-08-12 ENCOUNTER — Encounter: Admit: 2020-08-12 | Discharge: 2020-08-13 | Payer: PRIVATE HEALTH INSURANCE | Attending: Oncology | Primary: Oncology

## 2020-08-12 ENCOUNTER — Encounter
Admit: 2020-08-12 | Discharge: 2020-08-13 | Payer: PRIVATE HEALTH INSURANCE | Attending: Registered" | Primary: Registered"

## 2020-08-12 ENCOUNTER — Encounter
Admit: 2020-08-12 | Discharge: 2020-08-13 | Payer: PRIVATE HEALTH INSURANCE | Attending: Primary Care | Primary: Primary Care

## 2020-08-12 DIAGNOSIS — E039 Hypothyroidism, unspecified: Principal | ICD-10-CM

## 2020-08-12 DIAGNOSIS — Z8042 Family history of malignant neoplasm of prostate: Principal | ICD-10-CM

## 2020-08-12 DIAGNOSIS — D619 Aplastic anemia, unspecified: Principal | ICD-10-CM

## 2020-08-12 DIAGNOSIS — R161 Splenomegaly, not elsewhere classified: Principal | ICD-10-CM

## 2020-08-12 DIAGNOSIS — R590 Localized enlarged lymph nodes: Principal | ICD-10-CM

## 2020-08-12 DIAGNOSIS — D801 Nonfamilial hypogammaglobulinemia: Principal | ICD-10-CM

## 2020-08-12 DIAGNOSIS — Q998 Other specified chromosome abnormalities: Principal | ICD-10-CM

## 2020-08-12 DIAGNOSIS — Z20822 Contact with and (suspected) exposure to covid-19: Principal | ICD-10-CM

## 2020-08-12 DIAGNOSIS — J189 Pneumonia, unspecified organism: Principal | ICD-10-CM

## 2020-08-12 DIAGNOSIS — Z9221 Personal history of antineoplastic chemotherapy: Principal | ICD-10-CM

## 2020-08-12 DIAGNOSIS — B1910 Unspecified viral hepatitis B without hepatic coma: Principal | ICD-10-CM

## 2020-08-12 DIAGNOSIS — F32A Depression, unspecified: Principal | ICD-10-CM

## 2020-08-12 DIAGNOSIS — T865 Complications of stem cell transplant: Principal | ICD-10-CM

## 2020-08-12 DIAGNOSIS — Z803 Family history of malignant neoplasm of breast: Principal | ICD-10-CM

## 2020-08-12 DIAGNOSIS — K649 Unspecified hemorrhoids: Principal | ICD-10-CM

## 2020-08-12 DIAGNOSIS — C9111 Chronic lymphocytic leukemia of B-cell type in remission: Principal | ICD-10-CM

## 2020-08-12 DIAGNOSIS — E876 Hypokalemia: Principal | ICD-10-CM

## 2020-08-12 DIAGNOSIS — R109 Unspecified abdominal pain: Principal | ICD-10-CM

## 2020-08-12 DIAGNOSIS — Z01812 Encounter for preprocedural laboratory examination: Principal | ICD-10-CM

## 2020-08-12 DIAGNOSIS — Z7682 Awaiting organ transplant status: Principal | ICD-10-CM

## 2020-08-12 DIAGNOSIS — T50905A Adverse effect of unspecified drugs, medicaments and biological substances, initial encounter: Principal | ICD-10-CM

## 2020-08-12 DIAGNOSIS — R112 Nausea with vomiting, unspecified: Principal | ICD-10-CM

## 2020-08-12 DIAGNOSIS — Z713 Dietary counseling and surveillance: Principal | ICD-10-CM

## 2020-08-12 DIAGNOSIS — D849 Immunodeficiency, unspecified: Principal | ICD-10-CM

## 2020-08-12 DIAGNOSIS — Z9484 Stem cells transplant status: Principal | ICD-10-CM

## 2020-08-12 DIAGNOSIS — C9110 Chronic lymphocytic leukemia of B-cell type not having achieved remission: Principal | ICD-10-CM

## 2020-08-12 MED ORDER — DOCUSATE SODIUM 100 MG CAPSULE
ORAL_CAPSULE | Freq: Two times a day (BID) | ORAL | 1 refills | 15 days
Start: 2020-08-12 — End: 2021-08-12

## 2020-08-12 MED ORDER — SENNOSIDES 8.6 MG TABLET
ORAL_TABLET | Freq: Two times a day (BID) | ORAL | 2 refills | 30 days
Start: 2020-08-12 — End: 2020-09-03

## 2020-08-12 MED ORDER — PRAMOXINE 1 % TOPICAL FOAM
RECTAL | 0 refills | 0 days | PRN
Start: 2020-08-12 — End: ?

## 2020-08-13 ENCOUNTER — Ambulatory Visit
Admit: 2020-08-13 | Discharge: 2020-09-03 | Disposition: A | Discharge status: Home Health | Payer: PRIVATE HEALTH INSURANCE | Source: Ambulatory Visit | Admitting: Hematology & Oncology

## 2020-08-13 DIAGNOSIS — Z9484 Stem cells transplant status: Principal | ICD-10-CM

## 2020-08-17 DIAGNOSIS — N179 Acute kidney failure, unspecified: Principal | ICD-10-CM

## 2020-08-17 DIAGNOSIS — Z794 Long term (current) use of insulin: Principal | ICD-10-CM

## 2020-08-17 DIAGNOSIS — D801 Nonfamilial hypogammaglobulinemia: Principal | ICD-10-CM

## 2020-08-17 DIAGNOSIS — Z9221 Personal history of antineoplastic chemotherapy: Principal | ICD-10-CM

## 2020-08-17 DIAGNOSIS — R590 Localized enlarged lymph nodes: Principal | ICD-10-CM

## 2020-08-17 DIAGNOSIS — D6481 Anemia due to antineoplastic chemotherapy: Principal | ICD-10-CM

## 2020-08-17 DIAGNOSIS — E8770 Fluid overload, unspecified: Principal | ICD-10-CM

## 2020-08-17 DIAGNOSIS — R7881 Bacteremia: Principal | ICD-10-CM

## 2020-08-17 DIAGNOSIS — I129 Hypertensive chronic kidney disease with stage 1 through stage 4 chronic kidney disease, or unspecified chronic kidney disease: Principal | ICD-10-CM

## 2020-08-17 DIAGNOSIS — E039 Hypothyroidism, unspecified: Principal | ICD-10-CM

## 2020-08-17 DIAGNOSIS — F419 Anxiety disorder, unspecified: Principal | ICD-10-CM

## 2020-08-17 DIAGNOSIS — E876 Hypokalemia: Principal | ICD-10-CM

## 2020-08-17 DIAGNOSIS — B965 Pseudomonas (aeruginosa) (mallei) (pseudomallei) as the cause of diseases classified elsewhere: Principal | ICD-10-CM

## 2020-08-17 DIAGNOSIS — T865 Complications of stem cell transplant: Principal | ICD-10-CM

## 2020-08-17 DIAGNOSIS — C9111 Chronic lymphocytic leukemia of B-cell type in remission: Principal | ICD-10-CM

## 2020-08-17 DIAGNOSIS — F32A Depression, unspecified: Principal | ICD-10-CM

## 2020-08-17 DIAGNOSIS — D61818 Other pancytopenia: Principal | ICD-10-CM

## 2020-08-17 DIAGNOSIS — N189 Chronic kidney disease, unspecified: Principal | ICD-10-CM

## 2020-08-17 MED ORDER — MYCOPHENOLATE MOFETIL 250 MG CAPSULE
ORAL_CAPSULE | Freq: Three times a day (TID) | ORAL | 0 refills | 30 days
Start: 2020-08-17 — End: 2020-08-17

## 2020-08-18 ENCOUNTER — Ambulatory Visit: Admit: 2020-08-18 | Discharge: 2020-08-19 | Attending: Radiation Oncology | Primary: Radiation Oncology

## 2020-08-18 DIAGNOSIS — F32A Depression, unspecified: Principal | ICD-10-CM

## 2020-08-18 DIAGNOSIS — C9111 Chronic lymphocytic leukemia of B-cell type in remission: Principal | ICD-10-CM

## 2020-08-18 DIAGNOSIS — Z8042 Family history of malignant neoplasm of prostate: Principal | ICD-10-CM

## 2020-08-18 DIAGNOSIS — R5383 Other fatigue: Principal | ICD-10-CM

## 2020-08-18 DIAGNOSIS — E039 Hypothyroidism, unspecified: Principal | ICD-10-CM

## 2020-08-18 DIAGNOSIS — F419 Anxiety disorder, unspecified: Principal | ICD-10-CM

## 2020-08-18 DIAGNOSIS — C9110 Chronic lymphocytic leukemia of B-cell type not having achieved remission: Principal | ICD-10-CM

## 2020-08-18 DIAGNOSIS — M8589 Other specified disorders of bone density and structure, multiple sites: Principal | ICD-10-CM

## 2020-08-18 DIAGNOSIS — D801 Nonfamilial hypogammaglobulinemia: Principal | ICD-10-CM

## 2020-08-18 DIAGNOSIS — Z48290 Encounter for aftercare following bone marrow transplant: Principal | ICD-10-CM

## 2020-08-18 DIAGNOSIS — K649 Unspecified hemorrhoids: Principal | ICD-10-CM

## 2020-08-18 DIAGNOSIS — Z79890 Hormone replacement therapy (postmenopausal): Principal | ICD-10-CM

## 2020-08-18 DIAGNOSIS — B1910 Unspecified viral hepatitis B without hepatic coma: Principal | ICD-10-CM

## 2020-08-18 DIAGNOSIS — Z8701 Personal history of pneumonia (recurrent): Principal | ICD-10-CM

## 2020-08-18 DIAGNOSIS — G4700 Insomnia, unspecified: Principal | ICD-10-CM

## 2020-08-18 DIAGNOSIS — D696 Thrombocytopenia, unspecified: Principal | ICD-10-CM

## 2020-08-18 DIAGNOSIS — N179 Acute kidney failure, unspecified: Principal | ICD-10-CM

## 2020-08-18 DIAGNOSIS — K5900 Constipation, unspecified: Principal | ICD-10-CM

## 2020-08-18 DIAGNOSIS — Z803 Family history of malignant neoplasm of breast: Principal | ICD-10-CM

## 2020-08-18 DIAGNOSIS — R109 Unspecified abdominal pain: Principal | ICD-10-CM

## 2020-08-18 DIAGNOSIS — Z79899 Other long term (current) drug therapy: Principal | ICD-10-CM

## 2020-08-18 DIAGNOSIS — Z9221 Personal history of antineoplastic chemotherapy: Principal | ICD-10-CM

## 2020-08-18 DIAGNOSIS — D619 Aplastic anemia, unspecified: Principal | ICD-10-CM

## 2020-08-18 DIAGNOSIS — Z7682 Awaiting organ transplant status: Principal | ICD-10-CM

## 2020-08-22 DIAGNOSIS — E039 Hypothyroidism, unspecified: Principal | ICD-10-CM

## 2020-08-22 DIAGNOSIS — T865 Complications of stem cell transplant: Principal | ICD-10-CM

## 2020-08-22 DIAGNOSIS — E8770 Fluid overload, unspecified: Principal | ICD-10-CM

## 2020-08-22 DIAGNOSIS — Z9221 Personal history of antineoplastic chemotherapy: Principal | ICD-10-CM

## 2020-08-22 DIAGNOSIS — D6481 Anemia due to antineoplastic chemotherapy: Principal | ICD-10-CM

## 2020-08-22 DIAGNOSIS — Z794 Long term (current) use of insulin: Principal | ICD-10-CM

## 2020-08-22 DIAGNOSIS — F32A Depression, unspecified: Principal | ICD-10-CM

## 2020-08-22 DIAGNOSIS — B965 Pseudomonas (aeruginosa) (mallei) (pseudomallei) as the cause of diseases classified elsewhere: Principal | ICD-10-CM

## 2020-08-22 DIAGNOSIS — I129 Hypertensive chronic kidney disease with stage 1 through stage 4 chronic kidney disease, or unspecified chronic kidney disease: Principal | ICD-10-CM

## 2020-08-22 DIAGNOSIS — E876 Hypokalemia: Principal | ICD-10-CM

## 2020-08-22 DIAGNOSIS — F419 Anxiety disorder, unspecified: Principal | ICD-10-CM

## 2020-08-22 DIAGNOSIS — N179 Acute kidney failure, unspecified: Principal | ICD-10-CM

## 2020-08-22 DIAGNOSIS — R7881 Bacteremia: Principal | ICD-10-CM

## 2020-08-22 DIAGNOSIS — D61818 Other pancytopenia: Principal | ICD-10-CM

## 2020-08-22 DIAGNOSIS — D801 Nonfamilial hypogammaglobulinemia: Principal | ICD-10-CM

## 2020-08-22 DIAGNOSIS — R590 Localized enlarged lymph nodes: Principal | ICD-10-CM

## 2020-08-22 DIAGNOSIS — N189 Chronic kidney disease, unspecified: Principal | ICD-10-CM

## 2020-08-22 DIAGNOSIS — C9111 Chronic lymphocytic leukemia of B-cell type in remission: Principal | ICD-10-CM

## 2020-08-24 DIAGNOSIS — F419 Anxiety disorder, unspecified: Principal | ICD-10-CM

## 2020-08-24 DIAGNOSIS — F32A Depression, unspecified: Principal | ICD-10-CM

## 2020-08-24 DIAGNOSIS — N189 Chronic kidney disease, unspecified: Principal | ICD-10-CM

## 2020-08-24 DIAGNOSIS — E876 Hypokalemia: Principal | ICD-10-CM

## 2020-08-24 DIAGNOSIS — E8770 Fluid overload, unspecified: Principal | ICD-10-CM

## 2020-08-24 DIAGNOSIS — D801 Nonfamilial hypogammaglobulinemia: Principal | ICD-10-CM

## 2020-08-24 DIAGNOSIS — R7881 Bacteremia: Principal | ICD-10-CM

## 2020-08-24 DIAGNOSIS — T865 Complications of stem cell transplant: Principal | ICD-10-CM

## 2020-08-24 DIAGNOSIS — R590 Localized enlarged lymph nodes: Principal | ICD-10-CM

## 2020-08-24 DIAGNOSIS — D6481 Anemia due to antineoplastic chemotherapy: Principal | ICD-10-CM

## 2020-08-24 DIAGNOSIS — B965 Pseudomonas (aeruginosa) (mallei) (pseudomallei) as the cause of diseases classified elsewhere: Principal | ICD-10-CM

## 2020-08-24 DIAGNOSIS — C9111 Chronic lymphocytic leukemia of B-cell type in remission: Principal | ICD-10-CM

## 2020-08-24 DIAGNOSIS — Z9221 Personal history of antineoplastic chemotherapy: Principal | ICD-10-CM

## 2020-08-24 DIAGNOSIS — I129 Hypertensive chronic kidney disease with stage 1 through stage 4 chronic kidney disease, or unspecified chronic kidney disease: Principal | ICD-10-CM

## 2020-08-24 DIAGNOSIS — D61818 Other pancytopenia: Principal | ICD-10-CM

## 2020-08-24 DIAGNOSIS — N179 Acute kidney failure, unspecified: Principal | ICD-10-CM

## 2020-08-24 DIAGNOSIS — E039 Hypothyroidism, unspecified: Principal | ICD-10-CM

## 2020-08-24 DIAGNOSIS — Z794 Long term (current) use of insulin: Principal | ICD-10-CM

## 2020-08-24 MED ORDER — PREVYMIS 480 MG TABLET
ORAL_TABLET | Freq: Every day | ORAL | 0 refills | 30.00000 days
Start: 2020-08-24 — End: 2020-08-24

## 2020-08-30 DIAGNOSIS — Z9484 Stem cells transplant status: Principal | ICD-10-CM

## 2020-08-30 MED ORDER — LETERMOVIR 480 MG TABLET
ORAL_TABLET | Freq: Every day | ORAL | 2 refills | 28 days | Status: CP
Start: 2020-08-30 — End: ?
  Filled 2020-09-03: qty 28, 28d supply, fill #0

## 2020-08-30 MED ORDER — URSODIOL 300 MG CAPSULE
ORAL_CAPSULE | Freq: Three times a day (TID) | ORAL | 2 refills | 30 days
Start: 2020-08-30 — End: 2020-09-29

## 2020-08-31 ENCOUNTER — Encounter
Admit: 2020-08-31 | Discharge: 2020-08-31 | Payer: PRIVATE HEALTH INSURANCE | Attending: Hematology & Oncology | Primary: Hematology & Oncology

## 2020-08-31 DIAGNOSIS — R69 Illness, unspecified: Principal | ICD-10-CM

## 2020-09-01 ENCOUNTER — Ambulatory Visit: Admit: 2020-09-01 | Discharge: 2020-09-02 | Payer: PRIVATE HEALTH INSURANCE

## 2020-09-01 ENCOUNTER — Encounter
Admit: 2020-09-01 | Discharge: 2020-09-01 | Payer: PRIVATE HEALTH INSURANCE | Attending: Internal Medicine | Primary: Internal Medicine

## 2020-09-01 MED ORDER — OLANZAPINE 5 MG DISINTEGRATING TABLET
ORAL_TABLET | Freq: Every evening | ORAL | 0 refills | 7 days
Start: 2020-09-01 — End: ?

## 2020-09-01 MED ORDER — POTASSIUM CHLORIDE ER 10 MEQ TABLET,EXTENDED RELEASE(PART/CRYST)
ORAL_TABLET | Freq: Two times a day (BID) | ORAL | 2 refills | 30.00000 days | Status: CN
Start: 2020-09-01 — End: ?

## 2020-09-02 DIAGNOSIS — Z005 Encounter for examination of potential donor of organ and tissue: Principal | ICD-10-CM

## 2020-09-02 MED ORDER — MYCOPHENOLATE MOFETIL 250 MG CAPSULE
ORAL_CAPSULE | Freq: Three times a day (TID) | ORAL | 0 refills | 30 days | Status: CN
Start: 2020-09-02 — End: ?

## 2020-09-02 MED ORDER — URSODIOL 300 MG CAPSULE
ORAL_CAPSULE | Freq: Three times a day (TID) | ORAL | 2 refills | 30 days | Status: CN
Start: 2020-09-02 — End: 2020-10-02

## 2020-09-02 MED ORDER — AMLODIPINE 10 MG TABLET
ORAL_TABLET | Freq: Every day | ORAL | 1 refills | 30 days | Status: CN
Start: 2020-09-02 — End: ?

## 2020-09-02 MED ORDER — MAGNESIUM SULFATE 2 GRAM/50 ML (4 %) IN WATER INTRAVENOUS PIGGYBACK
Freq: Every day | INTRAVENOUS | 0 refills | 14 days | Status: CP
Start: 2020-09-02 — End: 2020-09-16

## 2020-09-03 DIAGNOSIS — D701 Agranulocytosis secondary to cancer chemotherapy: Principal | ICD-10-CM

## 2020-09-03 DIAGNOSIS — T451X5A Adverse effect of antineoplastic and immunosuppressive drugs, initial encounter: Principal | ICD-10-CM

## 2020-09-03 MED ORDER — SALIVA STIMULANT COMBINATION NO.3 ORAL MUCOSAL SPRAY
ORAL | 2 refills | 0 days | Status: CN | PRN
Start: 2020-09-03 — End: ?

## 2020-09-03 MED ORDER — AMLODIPINE 10 MG TABLET
ORAL_TABLET | Freq: Every day | ORAL | 2 refills | 30.00000 days | Status: CP
Start: 2020-09-03 — End: 2020-09-13
  Filled 2020-09-03: qty 30, 30d supply, fill #0

## 2020-09-03 MED ORDER — POTASSIUM CHLORIDE ER 10 MEQ TABLET,EXTENDED RELEASE(PART/CRYST)
ORAL_TABLET | Freq: Three times a day (TID) | ORAL | 1 refills | 30 days | Status: CP
Start: 2020-09-03 — End: ?
  Filled 2020-09-03: qty 163, 27d supply, fill #0

## 2020-09-03 MED ORDER — POLYETHYLENE GLYCOL 3350 17 GRAM ORAL POWDER PACKET
PACK | Freq: Every day | ORAL | 0 refills | 30 days | PRN
Start: 2020-09-03 — End: 2020-10-03

## 2020-09-03 MED ORDER — TACROLIMUS 0.5 MG CAPSULE, IMMEDIATE-RELEASE
ORAL_CAPSULE | Freq: Two times a day (BID) | ORAL | 0 refills | 1 days | Status: CP
Start: 2020-09-03 — End: 2020-09-10
  Filled 2020-09-03: qty 4, 1d supply, fill #0

## 2020-09-03 MED ORDER — SUCRALFATE 100 MG/ML ORAL SUSPENSION
Freq: Three times a day (TID) | ORAL | 2 refills | 14.00000 days | Status: CN | PRN
Start: 2020-09-03 — End: 2020-10-15

## 2020-09-03 MED ORDER — PROCHLORPERAZINE MALEATE 5 MG TABLET
ORAL_TABLET | Freq: Four times a day (QID) | ORAL | 2 refills | 8 days | Status: CP | PRN
Start: 2020-09-03 — End: ?
  Filled 2020-09-03: qty 30, 7d supply, fill #0

## 2020-09-03 MED ORDER — MYCOPHENOLATE MOFETIL 250 MG CAPSULE
ORAL_CAPSULE | Freq: Three times a day (TID) | ORAL | 1 refills | 30 days | Status: CP
Start: 2020-09-03 — End: ?
  Filled 2020-09-03: qty 270, 30d supply, fill #0

## 2020-09-03 MED ORDER — POTASSIUM CHLORIDE ER 20 MEQ TABLET,EXTENDED RELEASE(PART/CRYST)
ORAL_TABLET | Freq: Two times a day (BID) | ORAL | 11 refills | 30 days | Status: CN
Start: 2020-09-03 — End: 2021-09-03

## 2020-09-03 MED ORDER — PANTOPRAZOLE 40 MG TABLET,DELAYED RELEASE
ORAL_TABLET | Freq: Every day | ORAL | 0 refills | 30 days | Status: CP
Start: 2020-09-03 — End: ?
  Filled 2020-09-03: qty 30, 30d supply, fill #0

## 2020-09-03 MED ORDER — ONDANSETRON HCL 8 MG TABLET
ORAL_TABLET | Freq: Three times a day (TID) | ORAL | 1 refills | 20 days | Status: CP
Start: 2020-09-03 — End: ?
  Filled 2020-09-03: qty 60, 20d supply, fill #0

## 2020-09-03 MED ORDER — OLANZAPINE 5 MG DISINTEGRATING TABLET
ORAL_TABLET | Freq: Every evening | ORAL | 0 refills | 14 days | Status: CP
Start: 2020-09-03 — End: ?
  Filled 2020-09-03: qty 14, 14d supply, fill #0

## 2020-09-03 MED ORDER — URSODIOL 300 MG CAPSULE
ORAL_CAPSULE | Freq: Three times a day (TID) | ORAL | 2 refills | 30.00000 days | Status: CP
Start: 2020-09-03 — End: ?
  Filled 2020-09-03: qty 90, 30d supply, fill #0

## 2020-09-03 MED ORDER — ALUMINUM-MAG HYDROXIDE-SIMETHICONE 200 MG-200 MG-20 MG/5 ML ORAL SUSP
Freq: Four times a day (QID) | ORAL | 0 refills | 3 days | Status: CP | PRN
Start: 2020-09-03 — End: 2020-10-03
  Filled 2020-09-03: qty 355, 2d supply, fill #0

## 2020-09-03 MED ORDER — LOPERAMIDE 2 MG CAPSULE
ORAL_CAPSULE | ORAL | 0 refills | 4 days | Status: CN | PRN
Start: 2020-09-03 — End: 2020-10-03

## 2020-09-03 MED ORDER — CARBOXYMETHYLCELLULOSE SODIUM 0.25 % EYE DROPS
OPHTHALMIC | 1 refills | 100 days | Status: CP
Start: 2020-09-03 — End: ?
  Filled 2020-09-03: qty 15, 50d supply, fill #0

## 2020-09-03 MED ORDER — ZOLPIDEM 5 MG TABLET
ORAL_TABLET | 3 refills | 0 days
Start: 2020-09-03 — End: ?

## 2020-09-06 ENCOUNTER — Encounter
Admit: 2020-09-06 | Discharge: 2020-09-07 | Payer: PRIVATE HEALTH INSURANCE | Attending: Primary Care | Primary: Primary Care

## 2020-09-06 ENCOUNTER — Encounter: Admit: 2020-09-06 | Discharge: 2020-09-07 | Payer: PRIVATE HEALTH INSURANCE

## 2020-09-06 ENCOUNTER — Ambulatory Visit
Admit: 2020-09-06 | Discharge: 2020-09-07 | Payer: PRIVATE HEALTH INSURANCE | Attending: Pharmacist Clinician (PhC)/ Clinical Pharmacy Specialist | Primary: Pharmacist Clinician (PhC)/ Clinical Pharmacy Specialist

## 2020-09-06 DIAGNOSIS — C9110 Chronic lymphocytic leukemia of B-cell type not having achieved remission: Principal | ICD-10-CM

## 2020-09-06 DIAGNOSIS — D701 Agranulocytosis secondary to cancer chemotherapy: Principal | ICD-10-CM

## 2020-09-06 DIAGNOSIS — D801 Nonfamilial hypogammaglobulinemia: Principal | ICD-10-CM

## 2020-09-06 DIAGNOSIS — D61810 Antineoplastic chemotherapy induced pancytopenia: Principal | ICD-10-CM

## 2020-09-06 DIAGNOSIS — E039 Hypothyroidism, unspecified: Principal | ICD-10-CM

## 2020-09-06 DIAGNOSIS — R791 Abnormal coagulation profile: Principal | ICD-10-CM

## 2020-09-06 DIAGNOSIS — I10 Essential (primary) hypertension: Principal | ICD-10-CM

## 2020-09-06 DIAGNOSIS — T451X5A Adverse effect of antineoplastic and immunosuppressive drugs, initial encounter: Principal | ICD-10-CM

## 2020-09-06 DIAGNOSIS — Z9484 Stem cells transplant status: Principal | ICD-10-CM

## 2020-09-06 DIAGNOSIS — E876 Hypokalemia: Principal | ICD-10-CM

## 2020-09-06 DIAGNOSIS — C9111 Chronic lymphocytic leukemia of B-cell type in remission: Principal | ICD-10-CM

## 2020-09-06 DIAGNOSIS — B1910 Unspecified viral hepatitis B without hepatic coma: Principal | ICD-10-CM

## 2020-09-06 DIAGNOSIS — D849 Immunodeficiency, unspecified: Principal | ICD-10-CM

## 2020-09-06 MED ORDER — PHYTONADIONE (VITAMIN K1) 5 MG TABLET
ORAL_TABLET | Freq: Once | ORAL | 0 refills | 3.00000 days | Status: CP
Start: 2020-09-06 — End: 2020-09-06
  Filled 2020-09-06: qty 3, 3d supply, fill #0

## 2020-09-08 ENCOUNTER — Encounter: Admit: 2020-09-08 | Discharge: 2020-11-04 | Payer: PRIVATE HEALTH INSURANCE

## 2020-09-08 ENCOUNTER — Encounter
Admit: 2020-09-08 | Discharge: 2020-09-08 | Payer: PRIVATE HEALTH INSURANCE | Attending: Primary Care | Primary: Primary Care

## 2020-09-08 ENCOUNTER — Encounter: Admit: 2020-09-08 | Discharge: 2020-09-08 | Payer: PRIVATE HEALTH INSURANCE

## 2020-09-08 ENCOUNTER — Encounter: Admit: 2020-09-08 | Discharge: 2020-09-09 | Payer: PRIVATE HEALTH INSURANCE

## 2020-09-08 ENCOUNTER — Inpatient Hospital Stay: Admit: 2020-09-08 | Discharge: 2020-11-04 | Payer: PRIVATE HEALTH INSURANCE

## 2020-09-08 DIAGNOSIS — Z9484 Stem cells transplant status: Principal | ICD-10-CM

## 2020-09-08 DIAGNOSIS — R638 Other symptoms and signs concerning food and fluid intake: Principal | ICD-10-CM

## 2020-09-08 DIAGNOSIS — E876 Hypokalemia: Principal | ICD-10-CM

## 2020-09-08 DIAGNOSIS — D61818 Other pancytopenia: Principal | ICD-10-CM

## 2020-09-08 DIAGNOSIS — C9110 Chronic lymphocytic leukemia of B-cell type not having achieved remission: Principal | ICD-10-CM

## 2020-09-08 DIAGNOSIS — C9111 Chronic lymphocytic leukemia of B-cell type in remission: Principal | ICD-10-CM

## 2020-09-08 DIAGNOSIS — D84822 Immunocompromised state associated with stem cell transplant (CMS-HCC): Principal | ICD-10-CM

## 2020-09-10 ENCOUNTER — Encounter: Admit: 2020-09-10 | Discharge: 2020-09-10 | Payer: PRIVATE HEALTH INSURANCE

## 2020-09-10 DIAGNOSIS — Z9484 Stem cells transplant status: Principal | ICD-10-CM

## 2020-09-10 DIAGNOSIS — C9110 Chronic lymphocytic leukemia of B-cell type not having achieved remission: Principal | ICD-10-CM

## 2020-09-10 DIAGNOSIS — C9111 Chronic lymphocytic leukemia of B-cell type in remission: Principal | ICD-10-CM

## 2020-09-10 DIAGNOSIS — R791 Abnormal coagulation profile: Principal | ICD-10-CM

## 2020-09-10 MED ORDER — TACROLIMUS 0.5 MG CAPSULE, IMMEDIATE-RELEASE
ORAL_CAPSULE | ORAL | 5 refills | 30 days | Status: CP
Start: 2020-09-10 — End: ?
  Filled 2020-09-23: qty 150, 30d supply, fill #0

## 2020-09-13 ENCOUNTER — Encounter: Admit: 2020-09-13 | Discharge: 2020-09-14 | Payer: PRIVATE HEALTH INSURANCE

## 2020-09-13 ENCOUNTER — Encounter
Admit: 2020-09-13 | Discharge: 2020-09-14 | Payer: PRIVATE HEALTH INSURANCE | Attending: Primary Care | Primary: Primary Care

## 2020-09-13 DIAGNOSIS — T50905A Adverse effect of unspecified drugs, medicaments and biological substances, initial encounter: Principal | ICD-10-CM

## 2020-09-13 DIAGNOSIS — C9111 Chronic lymphocytic leukemia of B-cell type in remission: Principal | ICD-10-CM

## 2020-09-13 DIAGNOSIS — E876 Hypokalemia: Principal | ICD-10-CM

## 2020-09-13 DIAGNOSIS — D61818 Other pancytopenia: Principal | ICD-10-CM

## 2020-09-13 DIAGNOSIS — Z9484 Stem cells transplant status: Principal | ICD-10-CM

## 2020-09-13 DIAGNOSIS — R112 Nausea with vomiting, unspecified: Principal | ICD-10-CM

## 2020-09-13 DIAGNOSIS — C9110 Chronic lymphocytic leukemia of B-cell type not having achieved remission: Principal | ICD-10-CM

## 2020-09-13 DIAGNOSIS — D84822 Immunocompromised state associated with stem cell transplant (CMS-HCC): Principal | ICD-10-CM

## 2020-09-13 MED ORDER — OLANZAPINE 5 MG DISINTEGRATING TABLET
ORAL_TABLET | Freq: Every evening | ORAL | 0 refills | 14.00000 days
Start: 2020-09-13 — End: ?

## 2020-09-15 ENCOUNTER — Encounter: Admit: 2020-09-15 | Discharge: 2020-09-16 | Payer: PRIVATE HEALTH INSURANCE

## 2020-09-15 ENCOUNTER — Encounter
Admit: 2020-09-15 | Discharge: 2020-09-16 | Payer: PRIVATE HEALTH INSURANCE | Attending: Primary Care | Primary: Primary Care

## 2020-09-15 DIAGNOSIS — D61818 Other pancytopenia: Principal | ICD-10-CM

## 2020-09-15 DIAGNOSIS — C9111 Chronic lymphocytic leukemia of B-cell type in remission: Principal | ICD-10-CM

## 2020-09-15 DIAGNOSIS — D84822 Immunocompromised state associated with stem cell transplant (CMS-HCC): Principal | ICD-10-CM

## 2020-09-15 DIAGNOSIS — E876 Hypokalemia: Principal | ICD-10-CM

## 2020-09-15 DIAGNOSIS — Z9484 Stem cells transplant status: Principal | ICD-10-CM

## 2020-09-15 DIAGNOSIS — C9110 Chronic lymphocytic leukemia of B-cell type not having achieved remission: Principal | ICD-10-CM

## 2020-09-17 ENCOUNTER — Encounter: Admit: 2020-09-17 | Discharge: 2020-09-18 | Payer: PRIVATE HEALTH INSURANCE

## 2020-09-17 ENCOUNTER — Encounter
Admit: 2020-09-17 | Discharge: 2020-09-18 | Payer: PRIVATE HEALTH INSURANCE | Attending: Primary Care | Primary: Primary Care

## 2020-09-17 DIAGNOSIS — C9111 Chronic lymphocytic leukemia of B-cell type in remission: Principal | ICD-10-CM

## 2020-09-17 DIAGNOSIS — Z9484 Stem cells transplant status: Principal | ICD-10-CM

## 2020-09-17 DIAGNOSIS — R630 Anorexia: Principal | ICD-10-CM

## 2020-09-17 DIAGNOSIS — C9110 Chronic lymphocytic leukemia of B-cell type not having achieved remission: Principal | ICD-10-CM

## 2020-09-17 DIAGNOSIS — E876 Hypokalemia: Principal | ICD-10-CM

## 2020-09-17 DIAGNOSIS — D84822 Immunocompromised state associated with stem cell transplant (CMS-HCC): Principal | ICD-10-CM

## 2020-09-17 DIAGNOSIS — D61818 Other pancytopenia: Principal | ICD-10-CM

## 2020-09-17 MED ORDER — OLANZAPINE 5 MG DISINTEGRATING TABLET
ORAL_TABLET | Freq: Every evening | ORAL | 0 refills | 14 days
Start: 2020-09-17 — End: ?

## 2020-09-17 MED FILL — POSACONAZOLE 100 MG TABLET,DELAYED RELEASE: ORAL | 30 days supply | Qty: 90 | Fill #1

## 2020-09-18 DIAGNOSIS — B259 Cytomegaloviral disease, unspecified: Principal | ICD-10-CM

## 2020-09-18 MED ORDER — VALGANCICLOVIR 450 MG TABLET
ORAL_TABLET | Freq: Two times a day (BID) | ORAL | 0 refills | 30.00000 days | Status: CP
Start: 2020-09-18 — End: 2020-10-18
  Filled 2020-09-18: qty 60, 30d supply, fill #0

## 2020-09-18 MED FILL — ONDANSETRON HCL 8 MG TABLET: ORAL | 20 days supply | Qty: 60 | Fill #1

## 2020-09-20 DIAGNOSIS — C9111 Chronic lymphocytic leukemia of B-cell type in remission: Principal | ICD-10-CM

## 2020-09-20 DIAGNOSIS — R112 Nausea with vomiting, unspecified: Principal | ICD-10-CM

## 2020-09-20 DIAGNOSIS — T50905A Adverse effect of unspecified drugs, medicaments and biological substances, initial encounter: Principal | ICD-10-CM

## 2020-09-20 MED ORDER — OLANZAPINE 5 MG DISINTEGRATING TABLET
ORAL_TABLET | Freq: Every evening | ORAL | 0 refills | 14.00000 days
Start: 2020-09-20 — End: ?

## 2020-09-21 ENCOUNTER — Encounter: Admit: 2020-09-21 | Discharge: 2020-09-22 | Payer: PRIVATE HEALTH INSURANCE

## 2020-09-21 ENCOUNTER — Ambulatory Visit: Admit: 2020-09-21 | Payer: PRIVATE HEALTH INSURANCE

## 2020-09-21 DIAGNOSIS — Z9484 Stem cells transplant status: Principal | ICD-10-CM

## 2020-09-21 DIAGNOSIS — C9110 Chronic lymphocytic leukemia of B-cell type not having achieved remission: Principal | ICD-10-CM

## 2020-09-21 MED ORDER — OLANZAPINE 2.5 MG TABLET
ORAL_TABLET | Freq: Every evening | ORAL | 2 refills | 30 days | Status: CP
Start: 2020-09-21 — End: 2021-09-21
  Filled 2020-09-21: qty 30, 30d supply, fill #0

## 2020-09-23 MED FILL — LITHIUM CARBONATE ER 300 MG TABLET,EXTENDED RELEASE: ORAL | 30 days supply | Qty: 30 | Fill #0

## 2020-09-23 MED FILL — LEVOTHYROXINE 100 MCG TABLET: ORAL | 30 days supply | Qty: 30 | Fill #0

## 2020-09-24 ENCOUNTER — Ambulatory Visit
Admit: 2020-09-24 | Discharge: 2020-10-03 | Disposition: A | Discharge status: Home Health | Payer: PRIVATE HEALTH INSURANCE | Attending: Primary Care | Admitting: Internal Medicine

## 2020-09-24 ENCOUNTER — Ambulatory Visit
Admit: 2020-09-24 | Discharge: 2020-10-03 | Disposition: A | Discharge status: Home Health | Payer: PRIVATE HEALTH INSURANCE | Admitting: Internal Medicine

## 2020-09-24 ENCOUNTER — Encounter
Admit: 2020-09-24 | Discharge: 2020-10-03 | Disposition: A | Discharge status: Home Health | Payer: PRIVATE HEALTH INSURANCE | Attending: Anesthesiology | Admitting: Internal Medicine

## 2020-09-24 ENCOUNTER — Encounter: Admit: 2020-09-24 | Discharge: 2020-09-25 | Payer: PRIVATE HEALTH INSURANCE

## 2020-09-24 ENCOUNTER — Other Ambulatory Visit
Admit: 2020-09-24 | Discharge: 2020-10-03 | Disposition: A | Discharge status: Home Health | Payer: PRIVATE HEALTH INSURANCE | Admitting: Internal Medicine

## 2020-09-24 ENCOUNTER — Ambulatory Visit
Admit: 2020-09-24 | Discharge: 2020-10-03 | Disposition: A | Discharge status: Home Health | Payer: PRIVATE HEALTH INSURANCE | Attending: Pharmacist Clinician (PhC)/ Clinical Pharmacy Specialist | Admitting: Internal Medicine

## 2020-09-24 DIAGNOSIS — Z9484 Stem cells transplant status: Principal | ICD-10-CM

## 2020-09-24 DIAGNOSIS — R071 Chest pain on breathing: Principal | ICD-10-CM

## 2020-09-24 DIAGNOSIS — R630 Anorexia: Principal | ICD-10-CM

## 2020-09-24 DIAGNOSIS — C9110 Chronic lymphocytic leukemia of B-cell type not having achieved remission: Principal | ICD-10-CM

## 2020-09-24 MED ORDER — MIRTAZAPINE 15 MG TABLET
ORAL_TABLET | Freq: Every evening | ORAL | 0 refills | 30 days | Status: SS
Start: 2020-09-24 — End: 2020-10-24

## 2020-10-01 MED ORDER — VALGANCICLOVIR 50 MG/ML ORAL SOLUTION
Freq: Two times a day (BID) | GASTROENTERAL | 2 refills | 29 days
Start: 2020-10-01 — End: ?

## 2020-10-01 MED ORDER — FOLIC ACID 1 MG TABLET
ORAL_TABLET | Freq: Every evening | GASTROSTOMY | 5 refills | 30.00000 days
Start: 2020-10-01 — End: ?

## 2020-10-01 MED ORDER — MG-PLUS-PROTEIN 133 MG TABLET
ORAL_TABLET | Freq: Two times a day (BID) | GASTROSTOMY | 2 refills | 15 days
Start: 2020-10-01 — End: ?

## 2020-10-01 MED ORDER — LEVOTHYROXINE 100 MCG TABLET
ORAL_TABLET | Freq: Every day | GASTROSTOMY | 0 refills | 30 days
Start: 2020-10-01 — End: 2020-10-31

## 2020-10-01 MED ORDER — ESOMEPRAZOLE MAGNESIUM DR 40 MG GRANULES DELAYED RELEASE FOR SUSP
PACK | 0 refills | 0 days
Start: 2020-10-01 — End: ?

## 2020-10-01 MED ORDER — MYCOPHENOLATE MOFETIL 200 MG/ML ORAL SUSPENSION
Freq: Three times a day (TID) | GASTROSTOMY | 0 refills | 24 days
Start: 2020-10-01 — End: ?

## 2020-10-01 MED ORDER — ONDANSETRON HCL 8 MG TABLET
ORAL_TABLET | Freq: Three times a day (TID) | GASTROSTOMY | 0 refills | 30 days
Start: 2020-10-01 — End: 2020-10-31

## 2020-10-01 MED ORDER — MIRTAZAPINE 15 MG TABLET
ORAL_TABLET | Freq: Every evening | GASTROSTOMY | 0 refills | 30 days
Start: 2020-10-01 — End: 2020-10-31

## 2020-10-02 MED ORDER — ESOMEPRAZOLE MAGNESIUM DR 40 MG GRANULES DELAYED RELEASE FOR SUSP
PACK | 0 refills | 0 days
Start: 2020-10-02 — End: ?

## 2020-10-02 MED ORDER — LEVOTHYROXINE 100 MCG TABLET
ORAL_TABLET | Freq: Every day | GASTROSTOMY | 0 refills | 30 days
Start: 2020-10-02 — End: 2020-11-01

## 2020-10-02 MED ORDER — OLANZAPINE 5 MG DISINTEGRATING TABLET
ORAL_TABLET | Freq: Every evening | ORAL | 2 refills | 30 days
Start: 2020-10-02 — End: ?

## 2020-10-02 MED ORDER — MYCOPHENOLATE MOFETIL 200 MG/ML ORAL SUSPENSION
Freq: Three times a day (TID) | GASTROSTOMY | 0 refills | 24.00000 days
Start: 2020-10-02 — End: ?

## 2020-10-02 MED ORDER — TACROLIMUS ORAL SUS 1MG/ML (CAPS)
Freq: Two times a day (BID) | ORAL | 0 refills | 30 days
Start: 2020-10-02 — End: ?

## 2020-10-02 MED ORDER — ZOLPIDEM 10 MG TABLET
Freq: Every evening | GASTROSTOMY | 0 refills | 0 days | PRN
Start: 2020-10-02 — End: 2020-10-07

## 2020-10-02 NOTE — Progress Notes (Unsigned)
This encounter was created in error - please disregard.

## 2020-10-03 MED ORDER — MIRTAZAPINE 15 MG TABLET
ORAL_TABLET | Freq: Every evening | ORAL | 0 refills | 30 days | Status: CN
Start: 2020-10-03 — End: 2020-11-02

## 2020-10-03 MED ORDER — FOLIC ACID 1 MG TABLET
ORAL_TABLET | Freq: Every evening | GASTROSTOMY | 5 refills | 30 days | Status: CN
Start: 2020-10-03 — End: ?

## 2020-10-03 MED ORDER — ZOLPIDEM 5 MG TABLET
ORAL_TABLET | Freq: Every evening | GASTROSTOMY | 0 refills | 20.00000 days | Status: CP | PRN
Start: 2020-10-03 — End: 2020-10-23
  Filled 2020-10-03: qty 10, 20d supply, fill #0

## 2020-10-03 MED ORDER — TRAMADOL 50 MG TABLET
ORAL_TABLET | Freq: Four times a day (QID) | GASTROSTOMY | 0 refills | 5 days | Status: CP | PRN
Start: 2020-10-03 — End: 2020-10-08
  Filled 2020-10-03: qty 10, 5d supply, fill #0

## 2020-10-03 MED ORDER — ESOMEPRAZOLE MAGNESIUM DR 40 MG GRANULES DELAYED RELEASE FOR SUSP
PACK | 4 refills | 0.00000 days | Status: CP
Start: 2020-10-03 — End: 2020-10-03

## 2020-10-03 MED ORDER — MG-PLUS-PROTEIN 133 MG TABLET
ORAL_TABLET | Freq: Two times a day (BID) | GASTROSTOMY | 2 refills | 15.00000 days | Status: CN
Start: 2020-10-03 — End: ?
  Filled 2020-10-03: qty 100, 25d supply, fill #0

## 2020-10-03 MED ORDER — VALGANCICLOVIR 50 MG/ML ORAL SOLUTION
Freq: Two times a day (BID) | GASTROENTERAL | 1 refills | 30.00000 days | Status: CP
Start: 2020-10-03 — End: ?
  Filled 2020-10-03: qty 528, 29d supply, fill #0

## 2020-10-03 MED ORDER — PANTOPRAZOLE 2MG/ML SUS
1 refills | 0 days | Status: CP
Start: 2020-10-03 — End: ?
  Filled 2020-10-03: qty 125, 7d supply, fill #0

## 2020-10-03 MED ORDER — OLANZAPINE 5 MG DISINTEGRATING TABLET
ORAL_TABLET | Freq: Every evening | ORAL | 2 refills | 30 days | Status: CN
Start: 2020-10-03 — End: ?

## 2020-10-03 MED ORDER — ONDANSETRON HCL 8 MG TABLET
ORAL_TABLET | Freq: Three times a day (TID) | GASTROSTOMY | 0 refills | 30 days | Status: CN
Start: 2020-10-03 — End: 2020-11-02

## 2020-10-03 MED ORDER — MYCOPHENOLATE MOFETIL 200 MG/ML ORAL SUSPENSION
Freq: Three times a day (TID) | GASTROSTOMY | 0 refills | 24.00000 days | Status: CN
Start: 2020-10-03 — End: ?
  Filled 2020-10-03: qty 320, 43d supply, fill #0

## 2020-10-03 MED ORDER — TACROLIMUS ORAL SUS 1MG/ML (CAPS)
Freq: Two times a day (BID) | GASTROENTERAL | 3 refills | 30.00000 days | Status: CP
Start: 2020-10-03 — End: ?

## 2020-10-03 MED ORDER — ZOLPIDEM 10 MG TABLET
Freq: Every evening | GASTROSTOMY | 0 refills | 0 days | Status: CN | PRN
Start: 2020-10-03 — End: 2020-10-08

## 2020-10-04 DIAGNOSIS — D619 Aplastic anemia, unspecified: Principal | ICD-10-CM

## 2020-10-04 DIAGNOSIS — C9110 Chronic lymphocytic leukemia of B-cell type not having achieved remission: Principal | ICD-10-CM

## 2020-10-04 DIAGNOSIS — Z9484 Stem cells transplant status: Principal | ICD-10-CM

## 2020-10-04 DIAGNOSIS — D709 Neutropenia, unspecified: Principal | ICD-10-CM

## 2020-10-04 DIAGNOSIS — C9111 Chronic lymphocytic leukemia of B-cell type in remission: Principal | ICD-10-CM

## 2020-10-04 MED ORDER — TACROLIMUS 0.5 MG CAPSULE, IMMEDIATE-RELEASE
ORAL_CAPSULE | Freq: Two times a day (BID) | ORAL | 2 refills | 30 days | Status: CP
Start: 2020-10-04 — End: 2021-10-04
  Filled 2020-10-20: qty 120, 30d supply, fill #0

## 2020-10-04 MED ORDER — LEVOTHYROXINE 100 MCG TABLET
ORAL_TABLET | Freq: Every day | GASTROSTOMY | 5 refills | 30 days | Status: CP
Start: 2020-10-04 — End: ?
  Filled 2020-10-08: qty 30, 30d supply, fill #0

## 2020-10-04 MED ORDER — LITHIUM CARBONATE ER 300 MG TABLET,EXTENDED RELEASE
ORAL_TABLET | Freq: Every evening | ORAL | 5 refills | 30.00000 days | Status: CP
Start: 2020-10-04 — End: ?
  Filled 2020-10-08: qty 30, 30d supply, fill #0

## 2020-10-05 ENCOUNTER — Encounter: Admit: 2020-10-05 | Discharge: 2020-10-06 | Payer: PRIVATE HEALTH INSURANCE

## 2020-10-05 DIAGNOSIS — C9201 Acute myeloblastic leukemia, in remission: Principal | ICD-10-CM

## 2020-10-05 MED ORDER — NORMAL SALINE FLUSH INJ
Freq: Every day | INTRAVENOUS | 0 days
Start: 2020-10-05 — End: ?

## 2020-10-05 MED ORDER — HYDROMORPHONE 2 MG TABLET
ORAL_TABLET | Freq: Four times a day (QID) | ORAL | 0 refills | 10 days | Status: CP | PRN
Start: 2020-10-05 — End: 2021-10-05
  Filled 2020-10-05: qty 20, 5d supply, fill #0

## 2020-10-05 MED ORDER — HEPARIN FLUSH IV
Freq: Every day | INTRAVENOUS | 0 days
Start: 2020-10-05 — End: ?

## 2020-10-06 ENCOUNTER — Ambulatory Visit
Admit: 2020-10-06 | Discharge: 2020-10-06 | Payer: PRIVATE HEALTH INSURANCE | Attending: Registered" | Primary: Registered"

## 2020-10-06 ENCOUNTER — Encounter: Admit: 2020-10-06 | Discharge: 2020-10-06 | Payer: PRIVATE HEALTH INSURANCE

## 2020-10-06 ENCOUNTER — Ambulatory Visit: Admit: 2020-10-06 | Discharge: 2020-10-06 | Payer: PRIVATE HEALTH INSURANCE

## 2020-10-06 DIAGNOSIS — C9110 Chronic lymphocytic leukemia of B-cell type not having achieved remission: Principal | ICD-10-CM

## 2020-10-06 DIAGNOSIS — C9111 Chronic lymphocytic leukemia of B-cell type in remission: Principal | ICD-10-CM

## 2020-10-06 DIAGNOSIS — Z9484 Stem cells transplant status: Principal | ICD-10-CM

## 2020-10-08 ENCOUNTER — Encounter: Admit: 2020-10-08 | Discharge: 2020-10-09 | Payer: PRIVATE HEALTH INSURANCE

## 2020-10-08 ENCOUNTER — Ambulatory Visit: Admit: 2020-10-08 | Discharge: 2020-10-09 | Payer: PRIVATE HEALTH INSURANCE

## 2020-10-08 ENCOUNTER — Encounter
Admit: 2020-10-08 | Discharge: 2020-10-09 | Payer: PRIVATE HEALTH INSURANCE | Attending: Student in an Organized Health Care Education/Training Program | Primary: Student in an Organized Health Care Education/Training Program

## 2020-10-08 DIAGNOSIS — T50905A Adverse effect of unspecified drugs, medicaments and biological substances, initial encounter: Principal | ICD-10-CM

## 2020-10-08 DIAGNOSIS — C9110 Chronic lymphocytic leukemia of B-cell type not having achieved remission: Principal | ICD-10-CM

## 2020-10-08 DIAGNOSIS — D849 Immunodeficiency, unspecified: Principal | ICD-10-CM

## 2020-10-08 DIAGNOSIS — R112 Nausea with vomiting, unspecified: Principal | ICD-10-CM

## 2020-10-08 DIAGNOSIS — D708 Other neutropenia: Principal | ICD-10-CM

## 2020-10-08 DIAGNOSIS — Z9484 Stem cells transplant status: Principal | ICD-10-CM

## 2020-10-08 DIAGNOSIS — B259 Cytomegaloviral disease, unspecified: Principal | ICD-10-CM

## 2020-10-08 MED ORDER — MAGNESIUM SULFATE 2 GRAM/50 ML IN 0.9 % SODIUM CHLORIDE IV PIGGYBACK
Freq: Every day | INTRAVENOUS | 3 refills | 0 days | Status: CP
Start: 2020-10-08 — End: 2020-10-23
  Filled 2020-10-18: qty 260, 13d supply, fill #1

## 2020-10-08 MED ORDER — NYSTATIN 100,000 UNIT/ML ORAL SUSPENSION
Freq: Four times a day (QID) | ORAL | 0 refills | 7.00000 days | Status: CP
Start: 2020-10-08 — End: 2020-10-15
  Filled 2020-10-11: qty 140, 7d supply, fill #0

## 2020-10-08 MED ORDER — MYCOPHENOLATE MOFETIL 200 MG/ML ORAL SUSPENSION
Freq: Two times a day (BID) | GASTROSTOMY | 1 refills | 64 days
Start: 2020-10-08 — End: ?

## 2020-10-11 ENCOUNTER — Encounter: Admit: 2020-10-11 | Discharge: 2020-10-12 | Payer: PRIVATE HEALTH INSURANCE

## 2020-10-11 DIAGNOSIS — C9110 Chronic lymphocytic leukemia of B-cell type not having achieved remission: Principal | ICD-10-CM

## 2020-10-11 DIAGNOSIS — D702 Other drug-induced agranulocytosis: Principal | ICD-10-CM

## 2020-10-11 DIAGNOSIS — D708 Other neutropenia: Principal | ICD-10-CM

## 2020-10-11 DIAGNOSIS — Z9484 Stem cells transplant status: Principal | ICD-10-CM

## 2020-10-11 DIAGNOSIS — J189 Pneumonia, unspecified organism: Principal | ICD-10-CM

## 2020-10-11 DIAGNOSIS — D849 Immunodeficiency, unspecified: Principal | ICD-10-CM

## 2020-10-11 DIAGNOSIS — J90 Pleural effusion, not elsewhere classified: Principal | ICD-10-CM

## 2020-10-11 MED ORDER — MAGNESIUM SULFATE 2 GRAM/50 ML IN 0.9 % SODIUM CHLORIDE IV PIGGYBACK
INTRAVENOUS | 0 days
Start: 2020-10-11 — End: 2020-10-26

## 2020-10-11 MED ORDER — MYCOPHENOLATE MOFETIL 200 MG/ML ORAL SUSPENSION
GASTROSTOMY | 1 refills | 22 days
Start: 2020-10-11 — End: 2020-11-02

## 2020-10-11 MED ORDER — LEVOFLOXACIN 500 MG TABLET
ORAL_TABLET | Freq: Every day | ORAL | 0 refills | 30 days | Status: CP
Start: 2020-10-11 — End: 2020-11-10
  Filled 2020-10-11: qty 30, 30d supply, fill #0

## 2020-10-11 MED ORDER — ZOLPIDEM 5 MG TABLET
ORAL_TABLET | Freq: Every evening | GASTROSTOMY | 0 refills | 20.00000 days | Status: CP | PRN
Start: 2020-10-11 — End: 2020-10-11

## 2020-10-11 MED ORDER — ONDANSETRON HCL 8 MG TABLET
ORAL_TABLET | Freq: Three times a day (TID) | ORAL | 1 refills | 20.00000 days | Status: CN
Start: 2020-10-11 — End: ?

## 2020-10-13 ENCOUNTER — Encounter: Admit: 2020-10-13 | Discharge: 2020-10-14 | Payer: PRIVATE HEALTH INSURANCE

## 2020-10-13 ENCOUNTER — Encounter
Admit: 2020-10-13 | Discharge: 2020-10-14 | Payer: PRIVATE HEALTH INSURANCE | Attending: Registered" | Primary: Registered"

## 2020-10-13 ENCOUNTER — Ambulatory Visit
Admit: 2020-10-13 | Discharge: 2020-10-14 | Payer: PRIVATE HEALTH INSURANCE | Attending: Primary Care | Primary: Primary Care

## 2020-10-13 DIAGNOSIS — R638 Other symptoms and signs concerning food and fluid intake: Principal | ICD-10-CM

## 2020-10-13 DIAGNOSIS — J90 Pleural effusion, not elsewhere classified: Principal | ICD-10-CM

## 2020-10-13 DIAGNOSIS — D61818 Other pancytopenia: Principal | ICD-10-CM

## 2020-10-13 DIAGNOSIS — Z9484 Stem cells transplant status: Principal | ICD-10-CM

## 2020-10-13 DIAGNOSIS — C9111 Chronic lymphocytic leukemia of B-cell type in remission: Principal | ICD-10-CM

## 2020-10-13 DIAGNOSIS — C9110 Chronic lymphocytic leukemia of B-cell type not having achieved remission: Principal | ICD-10-CM

## 2020-10-13 DIAGNOSIS — D849 Immunodeficiency, unspecified: Principal | ICD-10-CM

## 2020-10-14 DIAGNOSIS — C9110 Chronic lymphocytic leukemia of B-cell type not having achieved remission: Principal | ICD-10-CM

## 2020-10-14 MED ORDER — POSACONAZOLE 100 MG TABLET,DELAYED RELEASE
ORAL_TABLET | Freq: Every day | ORAL | 1 refills | 30 days | Status: CP
Start: 2020-10-14 — End: ?
  Filled 2020-10-20: qty 90, 30d supply, fill #0

## 2020-10-15 ENCOUNTER — Other Ambulatory Visit: Admit: 2020-10-15 | Discharge: 2020-10-16 | Payer: PRIVATE HEALTH INSURANCE

## 2020-10-15 ENCOUNTER — Non-Acute Institutional Stay: Admit: 2020-10-15 | Discharge: 2020-10-16 | Payer: PRIVATE HEALTH INSURANCE

## 2020-10-15 ENCOUNTER — Encounter: Admit: 2020-10-15 | Discharge: 2020-10-16 | Payer: PRIVATE HEALTH INSURANCE

## 2020-10-15 ENCOUNTER — Ambulatory Visit: Admit: 2020-10-15 | Discharge: 2020-10-16 | Payer: PRIVATE HEALTH INSURANCE

## 2020-10-15 ENCOUNTER — Ambulatory Visit
Admit: 2020-10-15 | Discharge: 2020-10-16 | Payer: PRIVATE HEALTH INSURANCE | Attending: Primary Care | Primary: Primary Care

## 2020-10-16 ENCOUNTER — Other Ambulatory Visit: Payer: Self-pay | Admitting: Psychiatry

## 2020-10-16 NOTE — Telephone Encounter (Signed)
Please review

## 2020-10-18 ENCOUNTER — Encounter
Admit: 2020-10-18 | Discharge: 2020-10-18 | Payer: PRIVATE HEALTH INSURANCE | Attending: Student in an Organized Health Care Education/Training Program | Primary: Student in an Organized Health Care Education/Training Program

## 2020-10-18 ENCOUNTER — Ambulatory Visit: Admit: 2020-10-18 | Discharge: 2020-10-18 | Payer: PRIVATE HEALTH INSURANCE

## 2020-10-18 ENCOUNTER — Encounter: Admit: 2020-10-18 | Discharge: 2020-10-18 | Payer: PRIVATE HEALTH INSURANCE

## 2020-10-18 ENCOUNTER — Telehealth: Payer: Self-pay | Admitting: Psychiatry

## 2020-10-18 DIAGNOSIS — Z9481 Bone marrow transplant status: Principal | ICD-10-CM

## 2020-10-18 DIAGNOSIS — E039 Hypothyroidism, unspecified: Principal | ICD-10-CM

## 2020-10-18 DIAGNOSIS — Z9484 Stem cells transplant status: Principal | ICD-10-CM

## 2020-10-18 DIAGNOSIS — D619 Aplastic anemia, unspecified: Principal | ICD-10-CM

## 2020-10-18 DIAGNOSIS — D849 Immunodeficiency, unspecified: Principal | ICD-10-CM

## 2020-10-18 DIAGNOSIS — Z23 Encounter for immunization: Principal | ICD-10-CM

## 2020-10-18 DIAGNOSIS — T50905A Adverse effect of unspecified drugs, medicaments and biological substances, initial encounter: Principal | ICD-10-CM

## 2020-10-18 DIAGNOSIS — C9111 Chronic lymphocytic leukemia of B-cell type in remission: Principal | ICD-10-CM

## 2020-10-18 DIAGNOSIS — C9110 Chronic lymphocytic leukemia of B-cell type not having achieved remission: Principal | ICD-10-CM

## 2020-10-18 DIAGNOSIS — R112 Nausea with vomiting, unspecified: Principal | ICD-10-CM

## 2020-10-18 NOTE — Telephone Encounter (Signed)
Schedule her for televisit before 11 on work in Friday or earlier this week if available.  I do need her to get the lithium level ASAP.

## 2020-10-18 NOTE — Telephone Encounter (Signed)
Pt called needs phone apt ASAP. 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 [phone] pt contact #. Pharmacy [facility].

## 2020-10-18 NOTE — Telephone Encounter (Signed)
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 She did ask about her last lithium level and I told her around 11/2019. She will have them add that on for a reference.   I offered her a phone visit for Friday and she asked if it could be before 11. She has labs that day.

## 2020-10-19 DIAGNOSIS — E876 Hypokalemia: Principal | ICD-10-CM

## 2020-10-19 DIAGNOSIS — D619 Aplastic anemia, unspecified: Principal | ICD-10-CM

## 2020-10-19 DIAGNOSIS — M859 Disorder of bone density and structure, unspecified: Principal | ICD-10-CM

## 2020-10-19 DIAGNOSIS — K219 Gastro-esophageal reflux disease without esophagitis: Principal | ICD-10-CM

## 2020-10-19 DIAGNOSIS — D702 Other drug-induced agranulocytosis: Principal | ICD-10-CM

## 2020-10-19 DIAGNOSIS — Z9484 Stem cells transplant status: Principal | ICD-10-CM

## 2020-10-19 DIAGNOSIS — F418 Other specified anxiety disorders: Principal | ICD-10-CM

## 2020-10-19 DIAGNOSIS — D84822 Immunodeficiency due to external causes: Principal | ICD-10-CM

## 2020-10-19 DIAGNOSIS — I10 Essential (primary) hypertension: Principal | ICD-10-CM

## 2020-10-19 DIAGNOSIS — B258 Other cytomegaloviral diseases: Principal | ICD-10-CM

## 2020-10-19 DIAGNOSIS — D61818 Other pancytopenia: Principal | ICD-10-CM

## 2020-10-19 DIAGNOSIS — H11421 Conjunctival edema, right eye: Principal | ICD-10-CM

## 2020-10-19 DIAGNOSIS — E039 Hypothyroidism, unspecified: Principal | ICD-10-CM

## 2020-10-19 DIAGNOSIS — D801 Nonfamilial hypogammaglobulinemia: Principal | ICD-10-CM

## 2020-10-19 DIAGNOSIS — T451X5D Adverse effect of antineoplastic and immunosuppressive drugs, subsequent encounter: Principal | ICD-10-CM

## 2020-10-19 DIAGNOSIS — C9111 Chronic lymphocytic leukemia of B-cell type in remission: Principal | ICD-10-CM

## 2020-10-20 ENCOUNTER — Encounter: Admit: 2020-10-20 | Discharge: 2020-10-21 | Payer: PRIVATE HEALTH INSURANCE

## 2020-10-20 ENCOUNTER — Encounter
Admit: 2020-10-20 | Discharge: 2020-10-21 | Payer: PRIVATE HEALTH INSURANCE | Attending: Registered" | Primary: Registered"

## 2020-10-20 DIAGNOSIS — Z9481 Bone marrow transplant status: Principal | ICD-10-CM

## 2020-10-20 DIAGNOSIS — E875 Hyperkalemia: Principal | ICD-10-CM

## 2020-10-20 DIAGNOSIS — D61818 Other pancytopenia: Principal | ICD-10-CM

## 2020-10-20 DIAGNOSIS — J90 Pleural effusion, not elsewhere classified: Principal | ICD-10-CM

## 2020-10-20 DIAGNOSIS — R638 Other symptoms and signs concerning food and fluid intake: Principal | ICD-10-CM

## 2020-10-20 DIAGNOSIS — Z9484 Stem cells transplant status: Principal | ICD-10-CM

## 2020-10-20 DIAGNOSIS — F419 Anxiety disorder, unspecified: Principal | ICD-10-CM

## 2020-10-20 DIAGNOSIS — N179 Acute kidney failure, unspecified: Principal | ICD-10-CM

## 2020-10-20 DIAGNOSIS — C9110 Chronic lymphocytic leukemia of B-cell type not having achieved remission: Principal | ICD-10-CM

## 2020-10-20 MED ORDER — SODIUM CHLORIDE 0.9% IV BOLUS
Freq: Every day | INTRAVENOUS | 0 refills | 1 days | Status: CP
Start: 2020-10-20 — End: ?

## 2020-10-21 DIAGNOSIS — I10 Essential (primary) hypertension: Principal | ICD-10-CM

## 2020-10-21 DIAGNOSIS — Z9484 Stem cells transplant status: Principal | ICD-10-CM

## 2020-10-21 DIAGNOSIS — D801 Nonfamilial hypogammaglobulinemia: Principal | ICD-10-CM

## 2020-10-21 DIAGNOSIS — M859 Disorder of bone density and structure, unspecified: Principal | ICD-10-CM

## 2020-10-21 DIAGNOSIS — E039 Hypothyroidism, unspecified: Principal | ICD-10-CM

## 2020-10-21 DIAGNOSIS — T451X5D Adverse effect of antineoplastic and immunosuppressive drugs, subsequent encounter: Principal | ICD-10-CM

## 2020-10-21 DIAGNOSIS — D619 Aplastic anemia, unspecified: Principal | ICD-10-CM

## 2020-10-21 DIAGNOSIS — D61818 Other pancytopenia: Principal | ICD-10-CM

## 2020-10-21 DIAGNOSIS — B258 Other cytomegaloviral diseases: Principal | ICD-10-CM

## 2020-10-21 DIAGNOSIS — C9111 Chronic lymphocytic leukemia of B-cell type in remission: Principal | ICD-10-CM

## 2020-10-21 DIAGNOSIS — E876 Hypokalemia: Principal | ICD-10-CM

## 2020-10-21 DIAGNOSIS — H11421 Conjunctival edema, right eye: Principal | ICD-10-CM

## 2020-10-21 DIAGNOSIS — D84822 Immunodeficiency due to external causes: Principal | ICD-10-CM

## 2020-10-21 DIAGNOSIS — D702 Other drug-induced agranulocytosis: Principal | ICD-10-CM

## 2020-10-21 DIAGNOSIS — F418 Other specified anxiety disorders: Principal | ICD-10-CM

## 2020-10-21 DIAGNOSIS — K219 Gastro-esophageal reflux disease without esophagitis: Principal | ICD-10-CM

## 2020-10-22 ENCOUNTER — Other Ambulatory Visit: Admit: 2020-10-22 | Discharge: 2020-10-23 | Payer: PRIVATE HEALTH INSURANCE

## 2020-10-22 ENCOUNTER — Encounter
Admit: 2020-10-22 | Discharge: 2020-10-23 | Payer: PRIVATE HEALTH INSURANCE | Attending: Primary Care | Primary: Primary Care

## 2020-10-22 ENCOUNTER — Encounter: Admit: 2020-10-22 | Discharge: 2020-10-23 | Payer: PRIVATE HEALTH INSURANCE

## 2020-10-22 ENCOUNTER — Encounter: Payer: Self-pay | Admitting: Psychiatry

## 2020-10-22 ENCOUNTER — Telehealth (INDEPENDENT_AMBULATORY_CARE_PROVIDER_SITE_OTHER): Payer: BC Managed Care – PPO | Admitting: Psychiatry

## 2020-10-22 DIAGNOSIS — F3342 Major depressive disorder, recurrent, in full remission: Secondary | ICD-10-CM

## 2020-10-22 DIAGNOSIS — R634 Abnormal weight loss: Secondary | ICD-10-CM | POA: Diagnosis not present

## 2020-10-22 DIAGNOSIS — F4310 Post-traumatic stress disorder, unspecified: Secondary | ICD-10-CM | POA: Diagnosis not present

## 2020-10-22 DIAGNOSIS — F4001 Agoraphobia with panic disorder: Secondary | ICD-10-CM

## 2020-10-22 DIAGNOSIS — F5105 Insomnia due to other mental disorder: Secondary | ICD-10-CM

## 2020-10-22 DIAGNOSIS — D849 Immunodeficiency, unspecified: Principal | ICD-10-CM

## 2020-10-22 DIAGNOSIS — E875 Hyperkalemia: Principal | ICD-10-CM

## 2020-10-22 DIAGNOSIS — R638 Other symptoms and signs concerning food and fluid intake: Principal | ICD-10-CM

## 2020-10-22 DIAGNOSIS — R630 Anorexia: Principal | ICD-10-CM

## 2020-10-22 DIAGNOSIS — Z9484 Stem cells transplant status: Principal | ICD-10-CM

## 2020-10-22 DIAGNOSIS — F419 Anxiety disorder, unspecified: Principal | ICD-10-CM

## 2020-10-22 DIAGNOSIS — C9110 Chronic lymphocytic leukemia of B-cell type not having achieved remission: Principal | ICD-10-CM

## 2020-10-22 DIAGNOSIS — C9111 Chronic lymphocytic leukemia of B-cell type in remission: Principal | ICD-10-CM

## 2020-10-22 DIAGNOSIS — D61818 Other pancytopenia: Principal | ICD-10-CM

## 2020-10-22 NOTE — Progress Notes (Signed)
Meagan Walsh [id number] 1960/10/20 60 y.o.   Video Visit via My Chart  I connected with pt by My Chart and verified that I am speaking with the correct person using two identifiers.   I discussed the limitations, risks, security and privacy concerns of performing an evaluation and management service by My Chart  and the availability of in person appointments. I also discussed with the patient that there may be a patient responsible charge related to this service. The patient expressed understanding and agreed to proceed.  I discussed the assessment and treatment plan with the patient. The patient was provided an opportunity to ask questions and all were answered. The patient agreed with the plan and demonstrated an understanding of the instructions.   The patient was advised to call back or seek an in-person evaluation if the symptoms worsen or if the condition fails to improve as anticipated.  I provided 30 minutes of video time during this encounter.  The patient was located at home and the provider was located office. Session started 1010 to 1040  Subjective:   Patient ID:  Meagan Walsh is a 60 y.o. (DOB March 10, 1961) female.  Chief Complaint:  Chief Complaint  Patient presents with  . Follow-up  . Depression, major, recurrent, in complete remission ([place])  . Depression  . Stress    cancer  . [place] presents to the office today for follow-up of bipolar depression.  seen in January 2020.  Mood was stable and no meds were changed.   seen May 2020.  Lithium level was 0.68.  Mood was stable and no meds were changed.  Discussed her care with her oncologist Dr. Benay Spice in June 2020.  She was dealing with pancytopenia and it was requested that she wean off lamotrigine to see if it was contributing.  That was done.  August 11, 2019 another conversation with Dr. Benay Spice.  She had elevated lithium of 2.1 on August 08, 2019.  She had lost some weight and  was having some nausea and vomiting.  Lithium was held for a few days then restarted at 300 mg daily.  Lithium levels have been checked frequently since that time.  seen September 24, 2019.  She was not depressed but struggling with nausea and poor appetite and the following changes were made to see if it would help. In view of chronic nausea and weight loss despite med changes consider Trintellix.  Trintellix does cause nausea about 10% of people. It was tolerated in the past. Reduce Trintellix to 10 mg daily. Add mirtazapine 30 mg tablet at night which could help appetite and potentially replace Trintellix. If nausea is not resolved at follow up then stop Trintellix.  Call if depression worsens.  10/29/2019 appt noted: Did fine with med changes.  No problems with reduction in Trintellix.  Gained 11# since here.  At a good weight.  Wonders about stopping it for awhile. Good appetite.  No nausea now.  Still struggling with  CLL.  Needs bone marrow transplant but hasn't been able to occur yet and will be done in [place] with her living there for 3 mos.  Tired of being in limbo.  Rare Xanax used as nausea med.  Using Ambien at HS.  Sleep alright.   In spite of stress has not been depressed.  Patient reports stable mood and denies depressed or irritable moods.  Patient denies any recent difficulty with anxiety.  Patient denies difficulty with sleep  initiation or maintenance with Ambien. Denies appetite disturbance.  No weight loss.  Patient reports that energy and motivation have been good.  Patient denies any difficulty with concentration.  Patient denies any suicidal ideation. Plan: Continue Trintellix to 10 mg daily. Continue or stop mirtazapine 30 mg tablet at night per her request. Weight loss resolved.  10/22/2020 appt noted: 2nd bone marrow transplant in [place] in Jan. Will be there at least a couple of more months. Pending lithium level from today. Lost sig weight this year about 108#.   Feeding tube. Restarted mirtazapine 30 HS about a week ago. Has had a lot of nausea.   Takes alprazolam this week DT panicky feelings like walls caving in.  Worry over wasting away.  Seemed to help her.  Last day or 2 only needed one.  Don't think I've been depressed, certainly not like in the past.  More like anxiety feeling trapped.  In general feels ok. Still on trintellix 10 HS, lithium 300 mg HS, Ambien 5-10 mg HS. Sleep is pretty good.  Now labs 3 times per week and adjusting antirejection med.   Tolerating psych meds as far as known bc no history of SE with psych meds.  2 severe episodes depression in life that last a full year.  Multiple failed prior medication trials include but are not limited to the following:   duloxetine, Viibryd,  Trintellix 20 mg failed until lithium was added Rexulti 0.5 mg caused restlessness, Abilify, Vraylar, Mirtazapine 30 helped appetite.  Review of Systems:  Review of Systems  Constitutional: Positive for activity change, appetite change and unexpected weight change.  Gastrointestinal: Positive for diarrhea and nausea. Negative for vomiting.  Neurological: Negative for tremors and weakness.  Psychiatric/Behavioral: Negative for agitation, behavioral problems, confusion, decreased concentration, dysphoric mood, hallucinations, self-injury, sleep disturbance and suicidal ideas. The patient is not nervous/anxious and is not hyperactive.     Medications: I have reviewed the patient's current medications.  Current Outpatient Medications  Medication Sig Dispense Refill  . ALPRAZolam (XANAX) 0.25 MG tablet TAKE 1 TO 2 TABLETS BY MOUTH EVERY DAY AS NEEDED FOR ANXIETY 30 tablet 3  . famotidine (PEPCID) 20 MG tablet Take 1 tablet (20 mg total) by mouth 2 (two) times daily. 60 tablet 2  . levothyroxine (SYNTHROID) 100 MCG tablet TAKE 1 TABLET BY MOUTH EVERY DAY 30 tablet 2  . lithium 300 MG tablet Take 1 tablet (300 mg total) by mouth at bedtime. 90 tablet  0  . Magnesium 200 MG TABS Take 266 mg by mouth.    . mirtazapine (REMERON) 30 MG tablet Take 1 tablet (30 mg total) by mouth at bedtime. 90 tablet 0  . mycophenolate (CELLCEPT) 500 MG tablet Take by mouth 2 (two) times daily.    . ondansetron (ZOFRAN) 8 MG tablet Take 8 mg by mouth in the morning and at bedtime. Takes before am and pm meds    . Polyethylene Glycol 3350 (MIRALAX PO) Take 17 g by mouth daily as needed.    . posaconazole (NOXAFIL) 100 MG TBEC delayed-release tablet Take 300 mg by mouth daily.    . prochlorperazine (COMPAZINE) 5 MG tablet TAKE 1 TO 2 TABLETS EVERY 6 HOURS AS NEEDED FOR NAUSEA OR VOMITING FOR UP TO 3 DAYS 60 tablet 1  . tacrolimus (PROGRAF) 0.5 MG capsule Take 1.5 mg by mouth in the morning and at bedtime.    . valGANciclovir (VALCYTE) 450 MG tablet Take by mouth daily.    [name]  vortioxetine HBr (TRINTELLIX) 10 MG TABS tablet Take 1 tablet (10 mg total) by mouth daily. 90 tablet 0  . zolpidem (AMBIEN) 10 MG tablet TAKE 1 TABLET BY MOUTH EVERYDAY AT BEDTIME 90 tablet 0  . acetaminophen (TYLENOL) 325 MG tablet Take 650 mg by mouth every 6 (six) hours as needed. (Patient not taking: Reported on 10/22/2020)    . cefdinir (OMNICEF) 300 MG capsule Take 300 mg by mouth in the morning and at bedtime. (Patient not taking: Reported on 10/22/2020)    . folic acid (FOLVITE) 1 MG tablet TAKE 1 TABLET BY MOUTH EVERY DAY (Patient not taking: Reported on 10/22/2020) 90 tablet 0  . predniSONE (DELTASONE) 20 MG tablet Take 60 mg by mouth daily. (Patient not taking: Reported on 10/22/2020)    . PROMACTA 50 MG tablet Take 50 mg by mouth daily. (Patient not taking: Reported on 10/22/2020)    . valACYclovir (VALTREX) 500 MG tablet Take 500 mg by mouth daily. (Patient not taking: Reported on 10/22/2020)     No current facility-administered medications for this visit.   Facility-Administered Medications Ordered in Other Visits  Medication Dose Route Frequency Provider Last Rate Last Admin  . 0.9 %   sodium chloride infusion (Manually program via Guardrails IV Fluids)  250 mL Intravenous Once Betsy Coder B, MD      . 0.9 %  sodium chloride infusion (Manually program via Guardrails IV Fluids)  250 mL Intravenous Once Betsy Coder B, MD      . heparin lock flush 100 unit/mL  250 Units Intracatheter PRN Betsy Coder B, MD      . sodium chloride flush (NS) 0.9 % injection 10 mL  10 mL Intracatheter PRN Ladell Pier, MD   10 mL at 02/21/19 0945  . sodium chloride flush (NS) 0.9 % injection 10 mL  10 mL Intracatheter PRN Ladell Pier, MD      . SUMAtriptan (IMITREX) nasal spray 20 mg  20 mg Nasal Once Ladell Pier, MD        Medication Side Effects: None  Allergies: No Known Allergies  Past Medical History:  Diagnosis Date  . Anxiety   . Cancer ([place])    CLL  . Degenerative disk disease    Neck--chronic pain  . Depression   . Fibroids    Uterine  . 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 Hypothyroidism   . Lymphocytosis   . Mitral valve prolapse   . Mitral valve prolapse   . Thyroid disease    Hypothyroid    Family History  Problem Relation Age of Onset  . Breast cancer Mother   . Prostate cancer Father   . Colon cancer Neg Hx   . Esophageal cancer Neg Hx   . Pancreatic cancer Neg Hx   . Stomach cancer Neg Hx     Social History   Socioeconomic History  . Marital status: Married    Spouse name: Not on file  . Number of children: Not on file  . Years of education: Not on file  . Highest education level: Not on file  Occupational History  . Not on file  Tobacco Use  . Smoking status: Never Smoker  . Smokeless tobacco: Never Used  Vaping Use  . Vaping Use: Not on file  Substance and Sexual Activity  . Alcohol use: Not Currently    Comment: 2 glasses of wine 2 times per week   . Drug  use: No  . Sexual activity: Not on file  Other Topics Concern  . Not on file  Social History Narrative   Pt lives in 2 story home  with her husband   Has 3 adult children   Masters degree   Works at [employer] Strain: Not on [employer] Insecurity: Not on file  Transportation Needs: Not on file  Physical Activity: Not on file  Stress: Not on file  Social Connections: Not on file  Intimate Partner Violence: Not on file    Past Medical History, Surgical history, Social history, and Family history were reviewed and updated as appropriate.   Please see review of systems for further details on the patient's review from today.   Objective:   Physical Exam:  There were no vitals taken for this visit.  Physical Exam Constitutional:      General: She is not in acute distress.    Appearance: She is well-developed.  Musculoskeletal:        General: No deformity.  Neurological:     Mental Status: She is alert and oriented to person, place, and time.     Motor: No tremor.     Coordination: Coordination normal.     Gait: Gait normal.  Psychiatric:        Attention and Perception: Attention and perception normal.        Mood and Affect: Mood is anxious. Mood is not depressed. Affect is not labile, blunt, angry, tearful or inappropriate.        Speech: Speech normal.        Behavior: Behavior normal.        Thought Content: Thought content normal. Thought content does not include homicidal or suicidal ideation. Thought content does not include homicidal or suicidal plan.        Cognition and Memory: Cognition normal.        Judgment: Judgment normal.     Comments: Insight intact. No auditory or visual hallucinations. No delusions.      Lab Review:     Component Value Date/Time   NA 144 12/17/2019 0830   K 3.6 12/17/2019 0830   CL 111 12/17/2019 0830   CO2 26 12/17/2019 0830   GLUCOSE 149 (H) 12/17/2019 0830   BUN 18 12/17/2019 0830   CREATININE 0.83 12/17/2019 0830   CALCIUM 9.1 12/17/2019 0830   PROT 5.5 (L) 12/17/2019 0830   ALBUMIN 3.7  12/17/2019 0830   AST 14 (L) 12/17/2019 0830   ALT 28 12/17/2019 0830   ALKPHOS 111 12/17/2019 0830   BILITOT 0.5 12/17/2019 0830   GFRNONAA >60 12/17/2019 0830   GFRAA >60 12/17/2019 0830       Component Value Date/Time   WBC 0.5 (LL) 08/10/2020 1020   WBC 9.9 03/31/2019 0845   RBC 2.51 (L) 08/10/2020 1020   HGB 7.5 (L) 08/10/2020 1020   HGB 14.0 06/27/2016 1038   HCT 20.8 (L) 08/10/2020 1020   HCT 42.0 06/27/2016 1038   PLT 19 (L) 08/10/2020 1440   PLT 5 (LL) 08/10/2020 1020   PLT 205 06/27/2016 1038   MCV 82.9 08/10/2020 1020   MCV 92.9 06/27/2016 1038   MCH 29.9 08/10/2020 1020   MCHC 36.1 (H) 08/10/2020 1020   RDW 13.6 08/10/2020 1020   RDW 12.2 06/27/2016 1038   LYMPHSABS 0.1 (L) 08/10/2020 1020   LYMPHSABS 21.5 (H) 06/27/2016 1038   MONOABS 0.1 08/10/2020 1020  MONOABS 0.5 06/27/2016 1038   EOSABS 0.0 08/10/2020 1020   EOSABS 0.2 06/27/2016 1038   BASOSABS 0.0 08/10/2020 1020   BASOSABS 0.1 06/27/2016 1038    Lithium Lvl  Date Value Ref Range Status  12/11/2019 0.32 (L) 0.60 - 1.20 mmol/L Final    Comment:    Performed at [facility], [address] [place], [place] [address]    Component 08/24/20 08/14/20 04/02/20 01/07/20  Lithium Lvl 0.4 0.6 0.8 0.6   on 300 mg daily  No results found for: PHENYTOIN, PHENOBARB, VALPROATE, CBMZ   .res Assessment: Plan:    Depression, major, recurrent, in complete remission ([place])  PTSD (post-traumatic stress disorder)  Panic disorder with agoraphobia  Loss of weight  Insomnia due to mental condition   Meagan Walsh has had history of 2 very severe prolonged depressions that failed to respond to multiple psychiatric medications but then responded to lithium potentiation.  She has been through the past 2 winters with no difficulty.  She has a very strong seasonal pattern.  Did well with reduction in Trintellix from 20 to 10 mg and addition of mirtazapine to help nausea and weight gain.     Component 08/24/20 08/14/20 04/02/20 01/07/20  Lithium Lvl 0.4 0.6 0.8 0.6  On 300 mg daily.  Pending level from today.    Check lithium levels monthly bc fluid and weight changes frequently. Still doing ok with depression.  She wondered about possibly discontinuing or changing other medications.  She wonders if she really needs the Trintellix.  However after discussion we both agree now would not be a good time to change medication in view of pending bone marrow transplant in the next couple of months.  That will require her to be in [facility] for at least 2 to 3 months.  However we did discuss she could discontinue the mirtazapine if she wishes as long as her appetite and nausea do not develop into problems Again. Continue Trintellix to 10 mg daily and lithium 300 mg HS Continue or stop mirtazapine 30 mg tablet at night per her request.  Just restarted. Weight loss returned  Counseled patient regarding potential benefits, risks, and side effects of lithium to include potential risk of lithium affecting thyroid and renal function.  Discussed need for periodic lab monitoring to determine drug level and to assess for potential adverse effects.  Counseled patient regarding signs and symptoms of lithium toxicity and advised that they notify office immediately or seek urgent medical attention if experiencing these signs and symptoms.  Patient advised to contact office with any questions or concerns.   We discussed the short-term risks associated with benzodiazepines including sedation and increased fall risk among others.  Discussed long-term side effect risk including dependence, potential withdrawal symptoms, and the potential eventual dose-related risk of dementia.  But recent studies from 2020 dispute this association between benzodiazepines and dementia risk. Newer studies in 2020 do not support an association with dementia. Disc ways to taper if needed.  FU 3-4 mos  Lynder Parents, MD,  DFAPA   Please see After Visit Summary for patient specific instructions.  No future appointments.  No orders of the defined types were placed in this encounter.     -------------------------------

## 2020-10-25 ENCOUNTER — Encounter: Admit: 2020-10-25 | Discharge: 2020-10-26 | Payer: PRIVATE HEALTH INSURANCE

## 2020-10-25 ENCOUNTER — Encounter
Admit: 2020-10-25 | Discharge: 2020-10-26 | Payer: PRIVATE HEALTH INSURANCE | Attending: Primary Care | Primary: Primary Care

## 2020-10-25 DIAGNOSIS — R630 Anorexia: Principal | ICD-10-CM

## 2020-10-25 DIAGNOSIS — D61818 Other pancytopenia: Principal | ICD-10-CM

## 2020-10-25 DIAGNOSIS — R638 Other symptoms and signs concerning food and fluid intake: Principal | ICD-10-CM

## 2020-10-25 DIAGNOSIS — C9111 Chronic lymphocytic leukemia of B-cell type in remission: Principal | ICD-10-CM

## 2020-10-25 DIAGNOSIS — D702 Other drug-induced agranulocytosis: Principal | ICD-10-CM

## 2020-10-25 DIAGNOSIS — Z9484 Stem cells transplant status: Principal | ICD-10-CM

## 2020-10-25 DIAGNOSIS — E875 Hyperkalemia: Principal | ICD-10-CM

## 2020-10-25 DIAGNOSIS — C9110 Chronic lymphocytic leukemia of B-cell type not having achieved remission: Principal | ICD-10-CM

## 2020-10-25 DIAGNOSIS — D849 Immunodeficiency, unspecified: Principal | ICD-10-CM

## 2020-10-25 MED ORDER — SUCRALFATE 100 MG/ML ORAL SUSPENSION
Freq: Four times a day (QID) | ORAL | 1 refills | 11 days | Status: CP | PRN
Start: 2020-10-25 — End: 2020-11-24

## 2020-10-25 MED ORDER — FOLIC ACID 1 MG TABLET
ORAL_TABLET | Freq: Every day | ORAL | 11 refills | 30 days
Start: 2020-10-25 — End: 2021-10-25

## 2020-10-25 MED ORDER — VALGANCICLOVIR 50 MG/ML ORAL SOLUTION
Freq: Every day | GASTROENTERAL | 1 refills | 60.00000 days
Start: 2020-10-25 — End: 2020-11-24

## 2020-10-25 MED ORDER — MYCOPHENOLATE MOFETIL 200 MG/ML ORAL SUSPENSION
Freq: Every day | GASTROSTOMY | 1 refills | 128 days
Start: 2020-10-25 — End: 2020-11-01

## 2020-10-26 DIAGNOSIS — I10 Essential (primary) hypertension: Principal | ICD-10-CM

## 2020-10-26 DIAGNOSIS — D619 Aplastic anemia, unspecified: Principal | ICD-10-CM

## 2020-10-26 DIAGNOSIS — M859 Disorder of bone density and structure, unspecified: Principal | ICD-10-CM

## 2020-10-26 DIAGNOSIS — D801 Nonfamilial hypogammaglobulinemia: Principal | ICD-10-CM

## 2020-10-26 DIAGNOSIS — D84822 Immunodeficiency due to external causes: Principal | ICD-10-CM

## 2020-10-26 DIAGNOSIS — F418 Other specified anxiety disorders: Principal | ICD-10-CM

## 2020-10-26 DIAGNOSIS — D61818 Other pancytopenia: Principal | ICD-10-CM

## 2020-10-26 DIAGNOSIS — D702 Other drug-induced agranulocytosis: Principal | ICD-10-CM

## 2020-10-26 DIAGNOSIS — Z9484 Stem cells transplant status: Principal | ICD-10-CM

## 2020-10-26 DIAGNOSIS — E876 Hypokalemia: Principal | ICD-10-CM

## 2020-10-26 DIAGNOSIS — H11421 Conjunctival edema, right eye: Principal | ICD-10-CM

## 2020-10-26 DIAGNOSIS — T451X5D Adverse effect of antineoplastic and immunosuppressive drugs, subsequent encounter: Principal | ICD-10-CM

## 2020-10-26 DIAGNOSIS — K219 Gastro-esophageal reflux disease without esophagitis: Principal | ICD-10-CM

## 2020-10-26 DIAGNOSIS — E039 Hypothyroidism, unspecified: Principal | ICD-10-CM

## 2020-10-26 DIAGNOSIS — B258 Other cytomegaloviral diseases: Principal | ICD-10-CM

## 2020-10-26 DIAGNOSIS — C9111 Chronic lymphocytic leukemia of B-cell type in remission: Principal | ICD-10-CM

## 2020-10-27 DIAGNOSIS — I10 Essential (primary) hypertension: Principal | ICD-10-CM

## 2020-10-27 DIAGNOSIS — D84822 Immunodeficiency due to external causes: Principal | ICD-10-CM

## 2020-10-27 DIAGNOSIS — D801 Nonfamilial hypogammaglobulinemia: Principal | ICD-10-CM

## 2020-10-27 DIAGNOSIS — K219 Gastro-esophageal reflux disease without esophagitis: Principal | ICD-10-CM

## 2020-10-27 DIAGNOSIS — D619 Aplastic anemia, unspecified: Principal | ICD-10-CM

## 2020-10-27 DIAGNOSIS — Z9484 Stem cells transplant status: Principal | ICD-10-CM

## 2020-10-27 DIAGNOSIS — M859 Disorder of bone density and structure, unspecified: Principal | ICD-10-CM

## 2020-10-27 DIAGNOSIS — C9111 Chronic lymphocytic leukemia of B-cell type in remission: Principal | ICD-10-CM

## 2020-10-27 DIAGNOSIS — E039 Hypothyroidism, unspecified: Principal | ICD-10-CM

## 2020-10-27 DIAGNOSIS — F418 Other specified anxiety disorders: Principal | ICD-10-CM

## 2020-10-27 DIAGNOSIS — D61818 Other pancytopenia: Principal | ICD-10-CM

## 2020-10-27 DIAGNOSIS — E876 Hypokalemia: Principal | ICD-10-CM

## 2020-10-27 DIAGNOSIS — D702 Other drug-induced agranulocytosis: Principal | ICD-10-CM

## 2020-10-27 DIAGNOSIS — B258 Other cytomegaloviral diseases: Principal | ICD-10-CM

## 2020-10-27 DIAGNOSIS — T451X5D Adverse effect of antineoplastic and immunosuppressive drugs, subsequent encounter: Principal | ICD-10-CM

## 2020-10-27 DIAGNOSIS — H11421 Conjunctival edema, right eye: Principal | ICD-10-CM

## 2020-10-28 ENCOUNTER — Encounter: Admit: 2020-10-28 | Discharge: 2020-10-29 | Payer: PRIVATE HEALTH INSURANCE

## 2020-10-28 ENCOUNTER — Encounter
Admit: 2020-10-28 | Discharge: 2020-10-29 | Payer: PRIVATE HEALTH INSURANCE | Attending: Student in an Organized Health Care Education/Training Program | Primary: Student in an Organized Health Care Education/Training Program

## 2020-10-28 MED ORDER — OMEPRAZOLE 20 MG CAPSULE,DELAYED RELEASE
ORAL_CAPSULE | Freq: Every day | ORAL | 3 refills | 90 days
Start: 2020-10-28 — End: 2021-10-28

## 2020-10-29 ENCOUNTER — Encounter
Admit: 2020-10-29 | Discharge: 2020-10-30 | Payer: PRIVATE HEALTH INSURANCE | Attending: Registered" | Primary: Registered"

## 2020-11-02 ENCOUNTER — Encounter
Admit: 2020-11-02 | Discharge: 2020-11-03 | Payer: PRIVATE HEALTH INSURANCE | Attending: Registered" | Primary: Registered"

## 2020-11-02 DIAGNOSIS — Z9484 Stem cells transplant status: Principal | ICD-10-CM

## 2020-11-02 DIAGNOSIS — F418 Other specified anxiety disorders: Principal | ICD-10-CM

## 2020-11-02 DIAGNOSIS — M859 Disorder of bone density and structure, unspecified: Principal | ICD-10-CM

## 2020-11-02 DIAGNOSIS — B258 Other cytomegaloviral diseases: Principal | ICD-10-CM

## 2020-11-02 DIAGNOSIS — H11421 Conjunctival edema, right eye: Principal | ICD-10-CM

## 2020-11-02 DIAGNOSIS — D801 Nonfamilial hypogammaglobulinemia: Principal | ICD-10-CM

## 2020-11-02 DIAGNOSIS — T451X5D Adverse effect of antineoplastic and immunosuppressive drugs, subsequent encounter: Principal | ICD-10-CM

## 2020-11-02 DIAGNOSIS — D61818 Other pancytopenia: Principal | ICD-10-CM

## 2020-11-02 DIAGNOSIS — C9111 Chronic lymphocytic leukemia of B-cell type in remission: Principal | ICD-10-CM

## 2020-11-02 DIAGNOSIS — D619 Aplastic anemia, unspecified: Principal | ICD-10-CM

## 2020-11-02 DIAGNOSIS — E039 Hypothyroidism, unspecified: Principal | ICD-10-CM

## 2020-11-02 DIAGNOSIS — I10 Essential (primary) hypertension: Principal | ICD-10-CM

## 2020-11-02 DIAGNOSIS — K219 Gastro-esophageal reflux disease without esophagitis: Principal | ICD-10-CM

## 2020-11-02 DIAGNOSIS — E876 Hypokalemia: Principal | ICD-10-CM

## 2020-11-02 DIAGNOSIS — D84822 Immunodeficiency due to external causes: Principal | ICD-10-CM

## 2020-11-02 DIAGNOSIS — D702 Other drug-induced agranulocytosis: Principal | ICD-10-CM

## 2020-11-03 ENCOUNTER — Encounter: Admit: 2020-11-03 | Discharge: 2020-11-04 | Payer: PRIVATE HEALTH INSURANCE

## 2020-11-03 DIAGNOSIS — C9110 Chronic lymphocytic leukemia of B-cell type not having achieved remission: Principal | ICD-10-CM

## 2020-11-03 DIAGNOSIS — Z9484 Stem cells transplant status: Principal | ICD-10-CM

## 2020-11-03 DIAGNOSIS — C9111 Chronic lymphocytic leukemia of B-cell type in remission: Principal | ICD-10-CM

## 2020-11-03 DIAGNOSIS — D702 Other drug-induced agranulocytosis: Principal | ICD-10-CM

## 2020-11-03 MED ORDER — LOPERAMIDE 2 MG TABLET
ORAL_TABLET | Freq: Four times a day (QID) | ORAL | 0 refills | 8 days | Status: CP | PRN
Start: 2020-11-03 — End: 2020-12-03

## 2020-11-03 MED ORDER — ONDANSETRON HCL 8 MG TABLET
ORAL_TABLET | Freq: Three times a day (TID) | GASTROSTOMY | 0 refills | 10 days | Status: CP | PRN
Start: 2020-11-03 — End: 2020-12-03
  Filled 2020-11-05: qty 30, 10d supply, fill #0

## 2020-11-03 MED ORDER — ALPRAZOLAM 0.25 MG TABLET
ORAL_TABLET | Freq: Two times a day (BID) | ORAL | 0 refills | 15.00000 days | PRN
Start: 2020-11-03 — End: ?

## 2020-11-03 MED ORDER — ZOLPIDEM 5 MG TABLET
ORAL_TABLET | Freq: Every evening | GASTROSTOMY | 0 refills | 20 days | Status: CP | PRN
Start: 2020-11-03 — End: 2020-11-23
  Filled 2020-11-05: qty 10, 20d supply, fill #0

## 2020-11-03 MED ORDER — MAGNESIUM SULFATE 2 GRAM/50 ML IN 0.9 % SODIUM CHLORIDE IV PIGGYBACK
INTRAVENOUS | 0 refills | 0 days
Start: 2020-11-03 — End: 2020-11-18

## 2020-11-04 DIAGNOSIS — E039 Hypothyroidism, unspecified: Principal | ICD-10-CM

## 2020-11-04 DIAGNOSIS — I10 Essential (primary) hypertension: Principal | ICD-10-CM

## 2020-11-04 DIAGNOSIS — H11421 Conjunctival edema, right eye: Principal | ICD-10-CM

## 2020-11-04 DIAGNOSIS — D84822 Immunodeficiency due to external causes: Principal | ICD-10-CM

## 2020-11-04 DIAGNOSIS — D61818 Other pancytopenia: Principal | ICD-10-CM

## 2020-11-04 DIAGNOSIS — E876 Hypokalemia: Principal | ICD-10-CM

## 2020-11-04 DIAGNOSIS — F418 Other specified anxiety disorders: Principal | ICD-10-CM

## 2020-11-04 DIAGNOSIS — C9111 Chronic lymphocytic leukemia of B-cell type in remission: Principal | ICD-10-CM

## 2020-11-04 DIAGNOSIS — D702 Other drug-induced agranulocytosis: Principal | ICD-10-CM

## 2020-11-04 DIAGNOSIS — B258 Other cytomegaloviral diseases: Principal | ICD-10-CM

## 2020-11-04 DIAGNOSIS — Z9484 Stem cells transplant status: Principal | ICD-10-CM

## 2020-11-04 DIAGNOSIS — M859 Disorder of bone density and structure, unspecified: Principal | ICD-10-CM

## 2020-11-04 DIAGNOSIS — K219 Gastro-esophageal reflux disease without esophagitis: Principal | ICD-10-CM

## 2020-11-04 DIAGNOSIS — D619 Aplastic anemia, unspecified: Principal | ICD-10-CM

## 2020-11-04 DIAGNOSIS — T451X5D Adverse effect of antineoplastic and immunosuppressive drugs, subsequent encounter: Principal | ICD-10-CM

## 2020-11-04 DIAGNOSIS — D801 Nonfamilial hypogammaglobulinemia: Principal | ICD-10-CM

## 2020-11-04 MED ORDER — FOLIC ACID 1 MG TABLET
ORAL_TABLET | Freq: Every day | ORAL | 11 refills | 30 days
Start: 2020-11-04 — End: 2021-11-04

## 2020-11-05 ENCOUNTER — Other Ambulatory Visit: Admit: 2020-11-05 | Discharge: 2020-11-05 | Payer: PRIVATE HEALTH INSURANCE

## 2020-11-05 ENCOUNTER — Ambulatory Visit: Admit: 2020-11-05 | Discharge: 2020-11-05 | Payer: PRIVATE HEALTH INSURANCE

## 2020-11-05 ENCOUNTER — Encounter
Admit: 2020-11-05 | Discharge: 2020-11-05 | Payer: PRIVATE HEALTH INSURANCE | Attending: Nurse Practitioner | Primary: Nurse Practitioner

## 2020-11-05 DIAGNOSIS — C9110 Chronic lymphocytic leukemia of B-cell type not having achieved remission: Principal | ICD-10-CM

## 2020-11-05 DIAGNOSIS — Z9484 Stem cells transplant status: Principal | ICD-10-CM

## 2020-11-05 DIAGNOSIS — B259 Cytomegaloviral disease, unspecified: Principal | ICD-10-CM

## 2020-11-05 DIAGNOSIS — D84822 Immunocompromised state associated with stem cell transplant (CMS-HCC): Principal | ICD-10-CM

## 2020-11-05 MED ORDER — FOLIC ACID 1 MG TABLET
ORAL_TABLET | Freq: Every day | ORAL | 11 refills | 30 days
Start: 2020-11-05 — End: 2021-11-05

## 2020-11-06 MED ORDER — FOLIC ACID 1 MG TABLET
ORAL_TABLET | Freq: Every day | ORAL | 11 refills | 30 days
Start: 2020-11-06 — End: 2021-11-06

## 2020-11-07 MED ORDER — FOLIC ACID 1 MG TABLET
ORAL_TABLET | Freq: Every day | ORAL | 11 refills | 30 days
Start: 2020-11-07 — End: 2021-11-07

## 2020-11-08 ENCOUNTER — Encounter
Admit: 2020-11-08 | Discharge: 2020-11-09 | Payer: PRIVATE HEALTH INSURANCE | Attending: Registered" | Primary: Registered"

## 2020-11-09 ENCOUNTER — Encounter: Admit: 2020-11-09 | Discharge: 2020-11-10 | Payer: PRIVATE HEALTH INSURANCE

## 2020-11-09 ENCOUNTER — Ambulatory Visit: Admit: 2020-11-09 | Discharge: 2020-11-10 | Payer: PRIVATE HEALTH INSURANCE

## 2020-11-09 DIAGNOSIS — Z9484 Stem cells transplant status: Principal | ICD-10-CM

## 2020-11-09 DIAGNOSIS — C9110 Chronic lymphocytic leukemia of B-cell type not having achieved remission: Principal | ICD-10-CM

## 2020-11-09 MED ORDER — FOLIC ACID 1 MG TABLET
ORAL_TABLET | Freq: Every day | ORAL | 11 refills | 30 days | Status: CP
Start: 2020-11-09 — End: 2021-11-09
  Filled 2020-11-12: qty 30, 30d supply, fill #0

## 2020-11-09 MED ORDER — VALGANCICLOVIR 50 MG/ML ORAL SOLUTION
Freq: Every day | GASTROENTERAL | 1 refills | 60.00000 days | Status: CP
Start: 2020-11-09 — End: 2020-11-09

## 2020-11-09 MED ORDER — TACROLIMUS 0.5 MG CAPSULE, IMMEDIATE-RELEASE
ORAL_CAPSULE | Freq: Two times a day (BID) | ORAL | 1 refills | 30.00000 days | Status: CP
Start: 2020-11-09 — End: 2021-11-09

## 2020-11-10 DIAGNOSIS — Z9484 Stem cells transplant status: Principal | ICD-10-CM

## 2020-11-10 MED ORDER — VALGANCICLOVIR 50 MG/ML ORAL SOLUTION
Freq: Every day | GASTROENTERAL | 1 refills | 30.00000 days | Status: CP
Start: 2020-11-10 — End: ?

## 2020-11-12 ENCOUNTER — Encounter: Admit: 2020-11-12 | Discharge: 2020-11-13 | Payer: PRIVATE HEALTH INSURANCE

## 2020-11-12 DIAGNOSIS — B349 Viral infection, unspecified: Principal | ICD-10-CM

## 2020-11-12 DIAGNOSIS — Z9484 Stem cells transplant status: Principal | ICD-10-CM

## 2020-11-12 DIAGNOSIS — C9110 Chronic lymphocytic leukemia of B-cell type not having achieved remission: Principal | ICD-10-CM

## 2020-11-12 DIAGNOSIS — E039 Hypothyroidism, unspecified: Principal | ICD-10-CM

## 2020-11-12 MED ORDER — VALGANCICLOVIR 50 MG/ML ORAL SOLUTION
Freq: Every day | GASTROENTERAL | 1 refills | 30 days | Status: CP
Start: 2020-11-12 — End: ?
  Filled 2020-11-12: qty 264, 29d supply, fill #0

## 2020-11-15 ENCOUNTER — Institutional Professional Consult (permissible substitution)
Admit: 2020-11-15 | Discharge: 2020-11-16 | Payer: PRIVATE HEALTH INSURANCE | Attending: Registered" | Primary: Registered"

## 2020-11-15 DIAGNOSIS — Z9484 Stem cells transplant status: Principal | ICD-10-CM

## 2020-11-15 MED ORDER — TACROLIMUS 0.5 MG CAPSULE, IMMEDIATE-RELEASE
ORAL_CAPSULE | 1 refills | 0 days | Status: CP
Start: 2020-11-15 — End: ?

## 2020-11-16 ENCOUNTER — Encounter: Admit: 2020-11-16 | Discharge: 2020-11-17 | Payer: PRIVATE HEALTH INSURANCE

## 2020-11-16 DIAGNOSIS — Z9484 Stem cells transplant status: Principal | ICD-10-CM

## 2020-11-16 DIAGNOSIS — C9110 Chronic lymphocytic leukemia of B-cell type not having achieved remission: Principal | ICD-10-CM

## 2020-11-16 MED ORDER — TACROLIMUS 0.5 MG CAPSULE, IMMEDIATE-RELEASE
ORAL_CAPSULE | 1 refills | 0 days | Status: CP
Start: 2020-11-16 — End: ?
  Filled 2020-11-18: qty 270, 30d supply, fill #0

## 2020-11-17 DIAGNOSIS — D702 Other drug-induced agranulocytosis: Principal | ICD-10-CM

## 2020-11-17 MED ORDER — ONDANSETRON HCL 8 MG TABLET
ORAL_TABLET | Freq: Three times a day (TID) | GASTROSTOMY | 0 refills | 10.00000 days | Status: CP | PRN
Start: 2020-11-17 — End: 2020-12-17
  Filled 2020-11-18: qty 30, 10d supply, fill #0

## 2020-11-19 ENCOUNTER — Encounter: Admit: 2020-11-19 | Discharge: 2020-11-19 | Payer: PRIVATE HEALTH INSURANCE

## 2020-11-19 DIAGNOSIS — Z9484 Stem cells transplant status: Principal | ICD-10-CM

## 2020-11-23 ENCOUNTER — Encounter
Admit: 2020-11-23 | Discharge: 2020-11-23 | Payer: PRIVATE HEALTH INSURANCE | Attending: Student in an Organized Health Care Education/Training Program | Primary: Student in an Organized Health Care Education/Training Program

## 2020-11-23 ENCOUNTER — Encounter: Admit: 2020-11-23 | Discharge: 2020-11-23 | Payer: PRIVATE HEALTH INSURANCE

## 2020-11-23 DIAGNOSIS — D619 Aplastic anemia, unspecified: Principal | ICD-10-CM

## 2020-11-23 DIAGNOSIS — D849 Immunodeficiency, unspecified: Principal | ICD-10-CM

## 2020-11-23 DIAGNOSIS — C9111 Chronic lymphocytic leukemia of B-cell type in remission: Principal | ICD-10-CM

## 2020-11-23 DIAGNOSIS — D801 Nonfamilial hypogammaglobulinemia: Principal | ICD-10-CM

## 2020-11-23 DIAGNOSIS — C9110 Chronic lymphocytic leukemia of B-cell type not having achieved remission: Principal | ICD-10-CM

## 2020-11-23 DIAGNOSIS — Z9484 Stem cells transplant status: Principal | ICD-10-CM

## 2020-11-24 DIAGNOSIS — Z9484 Stem cells transplant status: Principal | ICD-10-CM

## 2020-11-24 DIAGNOSIS — D801 Nonfamilial hypogammaglobulinemia: Principal | ICD-10-CM

## 2020-11-26 ENCOUNTER — Encounter: Admit: 2020-11-26 | Discharge: 2020-11-26 | Payer: PRIVATE HEALTH INSURANCE

## 2020-11-26 DIAGNOSIS — C9110 Chronic lymphocytic leukemia of B-cell type not having achieved remission: Principal | ICD-10-CM

## 2020-11-26 DIAGNOSIS — D619 Aplastic anemia, unspecified: Principal | ICD-10-CM

## 2020-11-26 DIAGNOSIS — Z9484 Stem cells transplant status: Principal | ICD-10-CM

## 2020-11-28 ENCOUNTER — Other Ambulatory Visit: Payer: Self-pay | Admitting: Psychiatry

## 2020-11-28 DIAGNOSIS — F3342 Major depressive disorder, recurrent, in full remission: Secondary | ICD-10-CM

## 2020-12-01 ENCOUNTER — Encounter: Admit: 2020-12-01 | Discharge: 2020-12-02 | Payer: PRIVATE HEALTH INSURANCE

## 2020-12-01 DIAGNOSIS — D61818 Other pancytopenia: Principal | ICD-10-CM

## 2020-12-01 DIAGNOSIS — D849 Immunodeficiency, unspecified: Principal | ICD-10-CM

## 2020-12-01 DIAGNOSIS — Z9484 Stem cells transplant status: Principal | ICD-10-CM

## 2020-12-01 DIAGNOSIS — C9110 Chronic lymphocytic leukemia of B-cell type not having achieved remission: Principal | ICD-10-CM

## 2020-12-07 ENCOUNTER — Encounter
Admit: 2020-12-07 | Discharge: 2020-12-08 | Payer: PRIVATE HEALTH INSURANCE | Attending: Student in an Organized Health Care Education/Training Program | Primary: Student in an Organized Health Care Education/Training Program

## 2020-12-07 ENCOUNTER — Encounter: Admit: 2020-12-07 | Discharge: 2020-12-08 | Payer: PRIVATE HEALTH INSURANCE

## 2020-12-07 DIAGNOSIS — T50905A Adverse effect of unspecified drugs, medicaments and biological substances, initial encounter: Principal | ICD-10-CM

## 2020-12-07 DIAGNOSIS — D849 Immunodeficiency, unspecified: Principal | ICD-10-CM

## 2020-12-07 DIAGNOSIS — D702 Other drug-induced agranulocytosis: Principal | ICD-10-CM

## 2020-12-07 DIAGNOSIS — R112 Nausea with vomiting, unspecified: Principal | ICD-10-CM

## 2020-12-07 DIAGNOSIS — Z9484 Stem cells transplant status: Principal | ICD-10-CM

## 2020-12-07 DIAGNOSIS — D801 Nonfamilial hypogammaglobulinemia: Principal | ICD-10-CM

## 2020-12-07 DIAGNOSIS — C9110 Chronic lymphocytic leukemia of B-cell type not having achieved remission: Principal | ICD-10-CM

## 2020-12-07 MED ORDER — ONDANSETRON HCL 8 MG TABLET
ORAL_TABLET | Freq: Three times a day (TID) | GASTROSTOMY | 0 refills | 10 days | Status: CP | PRN
Start: 2020-12-07 — End: 2021-01-06
  Filled 2020-12-07: qty 30, 10d supply, fill #0

## 2020-12-07 MED ORDER — TACROLIMUS 0.5 MG CAPSULE, IMMEDIATE-RELEASE
ORAL_CAPSULE | Freq: Two times a day (BID) | ORAL | 1 refills | 30 days | Status: CP
Start: 2020-12-07 — End: ?
  Filled 2020-12-14: qty 300, 30d supply, fill #0

## 2020-12-08 MED FILL — VALGANCICLOVIR 50 MG/ML ORAL SOLUTION: GASTROENTERAL | 29 days supply | Qty: 264 | Fill #0

## 2020-12-09 MED FILL — LEVOTHYROXINE 100 MCG TABLET: GASTROSTOMY | 30 days supply | Qty: 30 | Fill #1

## 2020-12-15 ENCOUNTER — Encounter: Admit: 2020-12-15 | Discharge: 2020-12-16 | Payer: PRIVATE HEALTH INSURANCE

## 2020-12-15 DIAGNOSIS — D696 Thrombocytopenia, unspecified: Principal | ICD-10-CM

## 2020-12-15 DIAGNOSIS — Z9484 Stem cells transplant status: Principal | ICD-10-CM

## 2020-12-15 DIAGNOSIS — D849 Immunodeficiency, unspecified: Principal | ICD-10-CM

## 2020-12-15 DIAGNOSIS — D709 Neutropenia, unspecified: Principal | ICD-10-CM

## 2020-12-15 DIAGNOSIS — D801 Nonfamilial hypogammaglobulinemia: Principal | ICD-10-CM

## 2020-12-15 DIAGNOSIS — C9111 Chronic lymphocytic leukemia of B-cell type in remission: Principal | ICD-10-CM

## 2020-12-15 DIAGNOSIS — D649 Anemia, unspecified: Principal | ICD-10-CM

## 2020-12-15 DIAGNOSIS — C9110 Chronic lymphocytic leukemia of B-cell type not having achieved remission: Principal | ICD-10-CM

## 2020-12-15 DIAGNOSIS — E639 Nutritional deficiency, unspecified: Principal | ICD-10-CM

## 2020-12-15 MED ORDER — TACROLIMUS 1 MG CAPSULE, IMMEDIATE-RELEASE
ORAL_CAPSULE | Freq: Two times a day (BID) | ORAL | 2 refills | 30 days | Status: CP
Start: 2020-12-15 — End: ?
  Filled 2021-01-03: qty 180, 30d supply, fill #0

## 2020-12-15 MED FILL — LEVOTHYROXINE 100 MCG TABLET: GASTROSTOMY | 30 days supply | Qty: 30 | Fill #2

## 2020-12-15 MED FILL — FOLIC ACID 1 MG TABLET: ORAL | 30 days supply | Qty: 30 | Fill #1

## 2020-12-22 ENCOUNTER — Encounter
Admit: 2020-12-22 | Discharge: 2020-12-22 | Payer: PRIVATE HEALTH INSURANCE | Attending: Student in an Organized Health Care Education/Training Program | Primary: Student in an Organized Health Care Education/Training Program

## 2020-12-22 ENCOUNTER — Encounter: Admit: 2020-12-22 | Discharge: 2020-12-22 | Payer: PRIVATE HEALTH INSURANCE

## 2020-12-22 DIAGNOSIS — D849 Immunodeficiency, unspecified: Principal | ICD-10-CM

## 2020-12-22 DIAGNOSIS — Z9484 Stem cells transplant status: Principal | ICD-10-CM

## 2020-12-22 DIAGNOSIS — D619 Aplastic anemia, unspecified: Principal | ICD-10-CM

## 2020-12-22 DIAGNOSIS — C9110 Chronic lymphocytic leukemia of B-cell type not having achieved remission: Principal | ICD-10-CM

## 2020-12-22 DIAGNOSIS — T50905A Adverse effect of unspecified drugs, medicaments and biological substances, initial encounter: Principal | ICD-10-CM

## 2020-12-22 DIAGNOSIS — D702 Other drug-induced agranulocytosis: Principal | ICD-10-CM

## 2020-12-22 DIAGNOSIS — Z7682 Awaiting organ transplant status: Principal | ICD-10-CM

## 2020-12-22 DIAGNOSIS — R112 Nausea with vomiting, unspecified: Principal | ICD-10-CM

## 2020-12-22 DIAGNOSIS — D801 Nonfamilial hypogammaglobulinemia: Principal | ICD-10-CM

## 2020-12-22 MED ORDER — ONDANSETRON HCL 8 MG TABLET
ORAL_TABLET | Freq: Three times a day (TID) | GASTROSTOMY | 0 refills | 10.00000 days | Status: CP | PRN
Start: 2020-12-22 — End: 2021-01-21
  Filled 2020-12-22: qty 30, 10d supply, fill #0

## 2020-12-25 DIAGNOSIS — B259 Cytomegaloviral disease, unspecified: Principal | ICD-10-CM

## 2020-12-28 MED FILL — LITHIUM CARBONATE ER 300 MG TABLET,EXTENDED RELEASE: ORAL | 30 days supply | Qty: 30 | Fill #1

## 2020-12-29 ENCOUNTER — Ambulatory Visit: Admit: 2020-12-29 | Payer: PRIVATE HEALTH INSURANCE | Attending: Infectious Disease | Primary: Infectious Disease

## 2020-12-29 ENCOUNTER — Ambulatory Visit: Admit: 2020-12-29 | Payer: PRIVATE HEALTH INSURANCE

## 2020-12-30 DIAGNOSIS — Z9484 Stem cells transplant status: Principal | ICD-10-CM

## 2020-12-31 ENCOUNTER — Encounter
Admit: 2020-12-31 | Discharge: 2020-12-31 | Payer: PRIVATE HEALTH INSURANCE | Attending: Student in an Organized Health Care Education/Training Program | Primary: Student in an Organized Health Care Education/Training Program

## 2020-12-31 ENCOUNTER — Encounter
Admit: 2020-12-31 | Discharge: 2020-12-31 | Payer: PRIVATE HEALTH INSURANCE | Attending: Infectious Disease | Primary: Infectious Disease

## 2020-12-31 ENCOUNTER — Encounter: Admit: 2020-12-31 | Discharge: 2020-12-31 | Payer: PRIVATE HEALTH INSURANCE

## 2020-12-31 DIAGNOSIS — B259 Cytomegaloviral disease, unspecified: Principal | ICD-10-CM

## 2020-12-31 DIAGNOSIS — Z9484 Stem cells transplant status: Principal | ICD-10-CM

## 2020-12-31 MED ORDER — LETERMOVIR 480 MG TABLET
ORAL_TABLET | Freq: Every day | ORAL | 3 refills | 28 days | Status: CP
Start: 2020-12-31 — End: ?

## 2021-01-03 DIAGNOSIS — Z9484 Stem cells transplant status: Principal | ICD-10-CM

## 2021-01-03 MED FILL — VALGANCICLOVIR 50 MG/ML ORAL SOLUTION: GASTROENTERAL | 29 days supply | Qty: 264 | Fill #1

## 2021-01-05 DIAGNOSIS — B259 Cytomegaloviral disease, unspecified: Principal | ICD-10-CM

## 2021-01-05 MED ORDER — MARIBAVIR  200 MG TABLET
ORAL_TABLET | Freq: Two times a day (BID) | ORAL | 2 refills | 28 days | Status: CP
Start: 2021-01-05 — End: ?

## 2021-01-06 ENCOUNTER — Encounter: Admit: 2021-01-06 | Discharge: 2021-01-07 | Payer: PRIVATE HEALTH INSURANCE

## 2021-01-06 ENCOUNTER — Encounter
Admit: 2021-01-06 | Discharge: 2021-01-07 | Payer: PRIVATE HEALTH INSURANCE | Attending: Student in an Organized Health Care Education/Training Program | Primary: Student in an Organized Health Care Education/Training Program

## 2021-01-06 ENCOUNTER — Encounter
Admit: 2021-01-06 | Discharge: 2021-01-07 | Payer: PRIVATE HEALTH INSURANCE | Attending: Adult Health | Primary: Adult Health

## 2021-01-06 ENCOUNTER — Other Ambulatory Visit: Payer: Self-pay | Admitting: Psychiatry

## 2021-01-06 DIAGNOSIS — Z9484 Stem cells transplant status: Principal | ICD-10-CM

## 2021-01-06 DIAGNOSIS — D801 Nonfamilial hypogammaglobulinemia: Principal | ICD-10-CM

## 2021-01-06 DIAGNOSIS — D702 Other drug-induced agranulocytosis: Principal | ICD-10-CM

## 2021-01-06 DIAGNOSIS — B259 Cytomegaloviral disease, unspecified: Principal | ICD-10-CM

## 2021-01-06 DIAGNOSIS — C9110 Chronic lymphocytic leukemia of B-cell type not having achieved remission: Principal | ICD-10-CM

## 2021-01-06 DIAGNOSIS — D849 Immunodeficiency, unspecified: Principal | ICD-10-CM

## 2021-01-06 MED ORDER — LOPERAMIDE 2 MG TABLET
ORAL_TABLET | Freq: Four times a day (QID) | ORAL | 0 refills | 8 days | Status: CP | PRN
Start: 2021-01-06 — End: 2021-02-05

## 2021-01-07 MED ORDER — TACROLIMUS 1 MG CAPSULE, IMMEDIATE-RELEASE
ORAL_CAPSULE | Freq: Two times a day (BID) | ORAL | 2 refills | 30 days | Status: CP
Start: 2021-01-07 — End: ?
  Filled 2021-01-26: qty 240, 30d supply, fill #0

## 2021-01-07 NOTE — Telephone Encounter (Signed)
Please send

## 2021-01-08 DIAGNOSIS — D702 Other drug-induced agranulocytosis: Principal | ICD-10-CM

## 2021-01-10 DIAGNOSIS — B259 Cytomegaloviral disease, unspecified: Principal | ICD-10-CM

## 2021-01-10 MED ORDER — ONDANSETRON HCL 8 MG TABLET
ORAL_TABLET | Freq: Three times a day (TID) | GASTROSTOMY | 0 refills | 10 days | Status: CP | PRN
Start: 2021-01-10 — End: 2021-02-09

## 2021-01-11 ENCOUNTER — Encounter
Admit: 2021-01-11 | Discharge: 2021-01-12 | Payer: PRIVATE HEALTH INSURANCE | Attending: Student in an Organized Health Care Education/Training Program | Primary: Student in an Organized Health Care Education/Training Program

## 2021-01-11 ENCOUNTER — Encounter: Admit: 2021-01-11 | Discharge: 2021-01-12 | Payer: PRIVATE HEALTH INSURANCE

## 2021-01-11 DIAGNOSIS — B259 Cytomegaloviral disease, unspecified: Principal | ICD-10-CM

## 2021-01-11 DIAGNOSIS — D702 Other drug-induced agranulocytosis: Principal | ICD-10-CM

## 2021-01-11 DIAGNOSIS — Z9484 Stem cells transplant status: Principal | ICD-10-CM

## 2021-01-11 DIAGNOSIS — C9110 Chronic lymphocytic leukemia of B-cell type not having achieved remission: Principal | ICD-10-CM

## 2021-01-11 MED ORDER — METRONIDAZOLE 0.75 % TOPICAL CREAM
Freq: Two times a day (BID) | TOPICAL | 0 refills | 0 days | Status: CP
Start: 2021-01-11 — End: 2022-01-11

## 2021-01-14 DIAGNOSIS — D702 Other drug-induced agranulocytosis: Principal | ICD-10-CM

## 2021-01-14 MED ORDER — ONDANSETRON HCL 8 MG TABLET
ORAL_TABLET | Freq: Three times a day (TID) | GASTROSTOMY | 2 refills | 10.00000 days | Status: CP | PRN
Start: 2021-01-14 — End: 2021-02-13

## 2021-01-19 ENCOUNTER — Encounter: Admit: 2021-01-19 | Discharge: 2021-01-20 | Payer: PRIVATE HEALTH INSURANCE

## 2021-01-19 ENCOUNTER — Ambulatory Visit: Admit: 2021-01-19 | Discharge: 2021-01-20 | Payer: PRIVATE HEALTH INSURANCE

## 2021-01-19 DIAGNOSIS — C9110 Chronic lymphocytic leukemia of B-cell type not having achieved remission: Principal | ICD-10-CM

## 2021-01-19 DIAGNOSIS — Z9484 Stem cells transplant status: Principal | ICD-10-CM

## 2021-01-21 DIAGNOSIS — D801 Nonfamilial hypogammaglobulinemia: Principal | ICD-10-CM

## 2021-01-21 DIAGNOSIS — C9110 Chronic lymphocytic leukemia of B-cell type not having achieved remission: Principal | ICD-10-CM

## 2021-01-21 DIAGNOSIS — B259 Cytomegaloviral disease, unspecified: Principal | ICD-10-CM

## 2021-01-21 MED ORDER — MARIBAVIR  200 MG TABLET
ORAL_TABLET | Freq: Two times a day (BID) | ORAL | 2 refills | 30 days | Status: CP
Start: 2021-01-21 — End: ?

## 2021-01-21 MED ORDER — VALACYCLOVIR ORAL SUSP 50MG/ML
Freq: Every day | ORAL | 11 refills | 30.00000 days | Status: CP
Start: 2021-01-21 — End: ?
  Filled 2021-01-26: qty 210, 21d supply, fill #0

## 2021-01-26 ENCOUNTER — Encounter: Admit: 2021-01-26 | Discharge: 2021-01-26 | Payer: PRIVATE HEALTH INSURANCE

## 2021-01-26 DIAGNOSIS — Z9484 Stem cells transplant status: Principal | ICD-10-CM

## 2021-01-26 DIAGNOSIS — Z9481 Bone marrow transplant status: Principal | ICD-10-CM

## 2021-01-26 DIAGNOSIS — C9110 Chronic lymphocytic leukemia of B-cell type not having achieved remission: Principal | ICD-10-CM

## 2021-01-28 DIAGNOSIS — C9110 Chronic lymphocytic leukemia of B-cell type not having achieved remission: Principal | ICD-10-CM

## 2021-01-28 MED ORDER — AMOXICILLIN 500 MG TABLET
ORAL_TABLET | Freq: Once | ORAL | 1 refills | 0 days | Status: CP
Start: 2021-01-28 — End: 2021-01-28

## 2021-02-02 ENCOUNTER — Encounter
Admit: 2021-02-02 | Discharge: 2021-02-03 | Payer: PRIVATE HEALTH INSURANCE | Attending: Internal Medicine | Primary: Internal Medicine

## 2021-02-02 ENCOUNTER — Encounter: Admit: 2021-02-02 | Discharge: 2021-02-03 | Payer: PRIVATE HEALTH INSURANCE

## 2021-02-02 DIAGNOSIS — C9110 Chronic lymphocytic leukemia of B-cell type not having achieved remission: Principal | ICD-10-CM

## 2021-02-02 DIAGNOSIS — Z9484 Stem cells transplant status: Principal | ICD-10-CM

## 2021-02-02 DIAGNOSIS — Z9481 Bone marrow transplant status: Principal | ICD-10-CM

## 2021-02-04 MED ORDER — TACROLIMUS 1 MG CAPSULE, IMMEDIATE-RELEASE
ORAL_CAPSULE | Freq: Two times a day (BID) | ORAL | 2 refills | 30 days | Status: CP
Start: 2021-02-04 — End: ?

## 2021-02-06 ENCOUNTER — Other Ambulatory Visit: Payer: Self-pay | Admitting: Psychiatry

## 2021-02-06 DIAGNOSIS — F3342 Major depressive disorder, recurrent, in full remission: Secondary | ICD-10-CM

## 2021-02-06 DIAGNOSIS — D702 Other drug-induced agranulocytosis: Principal | ICD-10-CM

## 2021-02-06 MED ORDER — ONDANSETRON HCL 8 MG TABLET
ORAL_TABLET | 2 refills | 0 days
Start: 2021-02-06 — End: ?

## 2021-02-09 ENCOUNTER — Encounter: Admit: 2021-02-09 | Discharge: 2021-02-09 | Payer: PRIVATE HEALTH INSURANCE

## 2021-02-09 ENCOUNTER — Ambulatory Visit: Admit: 2021-02-09 | Discharge: 2021-02-09 | Payer: PRIVATE HEALTH INSURANCE

## 2021-02-09 ENCOUNTER — Ambulatory Visit
Admit: 2021-02-09 | Discharge: 2021-02-09 | Payer: PRIVATE HEALTH INSURANCE | Attending: Student in an Organized Health Care Education/Training Program | Primary: Student in an Organized Health Care Education/Training Program

## 2021-02-09 DIAGNOSIS — Z9481 Bone marrow transplant status: Principal | ICD-10-CM

## 2021-02-09 DIAGNOSIS — C9110 Chronic lymphocytic leukemia of B-cell type not having achieved remission: Principal | ICD-10-CM

## 2021-02-09 DIAGNOSIS — Z9484 Stem cells transplant status: Principal | ICD-10-CM

## 2021-02-09 MED FILL — ORA-SWEET SF ORAL LIQUID, ORA-PLUS ORAL SUSPENSION, VALACYCLOVIR 500 MG TABLET: ORAL | 21 days supply | Qty: 210 | Fill #1

## 2021-02-23 ENCOUNTER — Encounter: Admit: 2021-02-23 | Discharge: 2021-02-24 | Payer: PRIVATE HEALTH INSURANCE

## 2021-02-23 ENCOUNTER — Encounter
Admit: 2021-02-23 | Discharge: 2021-02-24 | Payer: PRIVATE HEALTH INSURANCE | Attending: Internal Medicine | Primary: Internal Medicine

## 2021-02-23 DIAGNOSIS — Z9484 Stem cells transplant status: Principal | ICD-10-CM

## 2021-02-23 DIAGNOSIS — Z9481 Bone marrow transplant status: Principal | ICD-10-CM

## 2021-02-23 MED ORDER — TACROLIMUS 1 MG CAPSULE, IMMEDIATE-RELEASE
ORAL_CAPSULE | ORAL | 2 refills | 30 days | Status: CP
Start: 2021-02-23 — End: ?
  Filled 2021-02-22: qty 180, 30d supply, fill #0

## 2021-02-25 DIAGNOSIS — Z9484 Stem cells transplant status: Principal | ICD-10-CM

## 2021-03-04 MED FILL — ORA-SWEET SF ORAL LIQUID, ORA-PLUS ORAL SUSPENSION, VALACYCLOVIR 500 MG TABLET: ORAL | 21 days supply | Qty: 210 | Fill #2

## 2021-03-05 DIAGNOSIS — U071 COVID-19: Principal | ICD-10-CM

## 2021-03-07 ENCOUNTER — Ambulatory Visit: Admit: 2021-03-07 | Discharge: 2021-03-08 | Payer: PRIVATE HEALTH INSURANCE

## 2021-03-07 DIAGNOSIS — U071 COVID-19: Principal | ICD-10-CM

## 2021-03-10 ENCOUNTER — Ambulatory Visit
Admit: 2021-03-10 | Discharge: 2021-03-11 | Payer: PRIVATE HEALTH INSURANCE | Attending: Student in an Organized Health Care Education/Training Program | Primary: Student in an Organized Health Care Education/Training Program

## 2021-03-10 ENCOUNTER — Ambulatory Visit: Admit: 2021-03-10 | Discharge: 2021-03-11 | Payer: PRIVATE HEALTH INSURANCE

## 2021-03-10 ENCOUNTER — Ambulatory Visit
Admit: 2021-03-10 | Discharge: 2021-03-11 | Payer: PRIVATE HEALTH INSURANCE | Attending: Pharmacist Clinician (PhC)/ Clinical Pharmacy Specialist | Primary: Pharmacist Clinician (PhC)/ Clinical Pharmacy Specialist

## 2021-03-10 ENCOUNTER — Telehealth: Admit: 2021-03-10 | Discharge: 2021-03-11 | Payer: PRIVATE HEALTH INSURANCE

## 2021-03-10 MED ORDER — MAGNESIUM OXIDE-MAGNESIUM AMINO ACID CHELATE 133 MG TABLET
ORAL_TABLET | Freq: Two times a day (BID) | ORAL | 3 refills | 25 days | Status: CP
Start: 2021-03-10 — End: ?
  Filled 2021-03-10: qty 100, 25d supply, fill #0

## 2021-03-14 ENCOUNTER — Other Ambulatory Visit: Payer: Self-pay | Admitting: *Deleted

## 2021-03-14 DIAGNOSIS — C9110 Chronic lymphocytic leukemia of B-cell type not having achieved remission: Secondary | ICD-10-CM

## 2021-03-14 NOTE — Progress Notes (Signed)
Per Dr. Benay Spice: Needs PICC flush/dressing change w/lab weekly beginning 8/24 or 8/25. Needs CBC/diff, CMP, tacrolimus level weekly. Orders placed and scheduling message sent.

## 2021-03-16 ENCOUNTER — Inpatient Hospital Stay: Payer: BC Managed Care – PPO

## 2021-03-16 ENCOUNTER — Inpatient Hospital Stay: Payer: BC Managed Care – PPO | Attending: Oncology

## 2021-03-16 ENCOUNTER — Other Ambulatory Visit: Payer: Self-pay

## 2021-03-16 DIAGNOSIS — C9110 Chronic lymphocytic leukemia of B-cell type not having achieved remission: Secondary | ICD-10-CM | POA: Diagnosis not present

## 2021-03-16 DIAGNOSIS — D619 Aplastic anemia, unspecified: Secondary | ICD-10-CM | POA: Diagnosis present

## 2021-03-16 LAB — CBC WITH DIFFERENTIAL (CANCER CENTER ONLY)
Abs Immature Granulocytes: 0 10*3/uL (ref 0.00–0.07)
Basophils Absolute: 0 10*3/uL (ref 0.0–0.1)
Basophils Relative: 0 %
Eosinophils Absolute: 0.3 10*3/uL (ref 0.0–0.5)
Eosinophils Relative: 6 %
HCT: 29.2 % — ABNORMAL LOW (ref 36.0–46.0)
Hemoglobin: 9.4 g/dL — ABNORMAL LOW (ref 12.0–15.0)
Immature Granulocytes: 0 %
Lymphocytes Relative: 20 %
Lymphs Abs: 1.1 10*3/uL (ref 0.7–4.0)
MCH: 33.7 pg (ref 26.0–34.0)
MCHC: 32.2 g/dL (ref 30.0–36.0)
MCV: 104.7 fL — ABNORMAL HIGH (ref 80.0–100.0)
Monocytes Absolute: 0.2 10*3/uL (ref 0.1–1.0)
Monocytes Relative: 4 %
Neutro Abs: 3.7 10*3/uL (ref 1.7–7.7)
Neutrophils Relative %: 70 %
Platelet Count: 57 10*3/uL — ABNORMAL LOW (ref 150–400)
RBC: 2.79 MIL/uL — ABNORMAL LOW (ref 3.87–5.11)
RDW: 13 % (ref 11.5–15.5)
WBC Count: 5.3 10*3/uL (ref 4.0–10.5)
nRBC: 0 % (ref 0.0–0.2)

## 2021-03-16 LAB — CMP (CANCER CENTER ONLY)
ALT: 13 U/L (ref 0–44)
AST: 13 U/L — ABNORMAL LOW (ref 15–41)
Albumin: 3.8 g/dL (ref 3.5–5.0)
Alkaline Phosphatase: 69 U/L (ref 38–126)
Anion gap: 8 (ref 5–15)
BUN: 26 mg/dL — ABNORMAL HIGH (ref 8–23)
CO2: 23 mmol/L (ref 22–32)
Calcium: 9 mg/dL (ref 8.9–10.3)
Chloride: 111 mmol/L (ref 98–111)
Creatinine: 1.47 mg/dL — ABNORMAL HIGH (ref 0.44–1.00)
GFR, Estimated: 40 mL/min — ABNORMAL LOW (ref >60)
Glucose, Bld: 140 mg/dL — ABNORMAL HIGH (ref 70–99)
Potassium: 3.4 mmol/L — ABNORMAL LOW (ref 3.5–5.1)
Sodium: 142 mmol/L (ref 135–145)
Total Bilirubin: 0.4 mg/dL (ref 0.3–1.2)
Total Protein: 5.9 g/dL — ABNORMAL LOW (ref 6.5–8.1)

## 2021-03-18 LAB — TACROLIMUS LEVEL: Tacrolimus (FK506) - LabCorp: 7.2 ng/mL (ref 2.0–20.0)

## 2021-03-23 ENCOUNTER — Inpatient Hospital Stay: Payer: BC Managed Care – PPO

## 2021-03-24 ENCOUNTER — Ambulatory Visit: Admit: 2021-03-24 | Discharge: 2021-03-25 | Payer: PRIVATE HEALTH INSURANCE

## 2021-03-24 ENCOUNTER — Ambulatory Visit
Admit: 2021-03-24 | Discharge: 2021-03-25 | Payer: PRIVATE HEALTH INSURANCE | Attending: Student in an Organized Health Care Education/Training Program | Primary: Student in an Organized Health Care Education/Training Program

## 2021-03-24 ENCOUNTER — Telehealth: Payer: Self-pay | Admitting: Psychiatry

## 2021-03-24 MED FILL — ORA-SWEET SF ORAL LIQUID, ORA-PLUS ORAL SUSPENSION, VALACYCLOVIR 500 MG TABLET: ORAL | 20 days supply | Qty: 200 | Fill #3

## 2021-03-24 NOTE — Telephone Encounter (Signed)
Message to CC to return call about mutual pt. Dr. Oretha Ellis [phone] @ [address]

## 2021-03-24 NOTE — Telephone Encounter (Signed)
RTC. NA

## 2021-03-25 ENCOUNTER — Telehealth: Payer: Self-pay | Admitting: Psychiatry

## 2021-03-25 DIAGNOSIS — Z9484 Stem cells transplant status: Principal | ICD-10-CM

## 2021-03-25 MED ORDER — TACROLIMUS 1 MG CAPSULE, IMMEDIATE-RELEASE
ORAL_CAPSULE | ORAL | 2 refills | 30 days | Status: CP
Start: 2021-03-25 — End: ?

## 2021-03-25 NOTE — Telephone Encounter (Signed)
Return telephone call to oncologist at [facility] Dr. Theodoro Clock. Reports that the patient is not eating well and has required a G-tube as well as a central line.  Dr. Theodoro Clock believes the patient has developed anorexia nervosa and is restricting not only calories but also fluids.  There is no other reasons for the gastric tube or further central line other than maintaining hydration and nutrition because she will not take in enough orally. The patient is no longer on mirtazapine but is still on Trintellix.  Suggested the oncologist restart the mirtazapine 30 mg nightly because it helped the patient gain weight before.  We will get her in to the office as soon as possible.  If in fact she does have symptoms of any her anorexia nervosa then she will need more intensive therapy.  Dr. Theodoro Clock believes she may need an inpatient anorexia nervosa program.

## 2021-03-25 NOTE — Telephone Encounter (Signed)
TC noted with Dr. Oretha Ellis

## 2021-03-29 DIAGNOSIS — Z9484 Stem cells transplant status: Principal | ICD-10-CM

## 2021-03-30 ENCOUNTER — Encounter: Admit: 2021-03-30 | Discharge: 2021-03-31 | Payer: PRIVATE HEALTH INSURANCE

## 2021-03-30 ENCOUNTER — Inpatient Hospital Stay: Payer: BC Managed Care – PPO

## 2021-03-30 DIAGNOSIS — Z9484 Stem cells transplant status: Principal | ICD-10-CM

## 2021-03-30 DIAGNOSIS — B259 Cytomegaloviral disease, unspecified: Principal | ICD-10-CM

## 2021-03-31 DIAGNOSIS — D702 Other drug-induced agranulocytosis: Principal | ICD-10-CM

## 2021-03-31 MED ORDER — ONDANSETRON HCL 8 MG TABLET
ORAL_TABLET | 2 refills | 0 days
Start: 2021-03-31 — End: ?

## 2021-04-01 ENCOUNTER — Ambulatory Visit: Payer: BC Managed Care – PPO | Admitting: Psychiatry

## 2021-04-01 ENCOUNTER — Other Ambulatory Visit: Payer: Self-pay

## 2021-04-01 ENCOUNTER — Encounter: Payer: Self-pay | Admitting: Psychiatry

## 2021-04-01 DIAGNOSIS — F3342 Major depressive disorder, recurrent, in full remission: Secondary | ICD-10-CM | POA: Diagnosis not present

## 2021-04-01 DIAGNOSIS — R634 Abnormal weight loss: Secondary | ICD-10-CM | POA: Diagnosis not present

## 2021-04-01 DIAGNOSIS — F4310 Post-traumatic stress disorder, unspecified: Secondary | ICD-10-CM | POA: Diagnosis not present

## 2021-04-01 DIAGNOSIS — F4001 Agoraphobia with panic disorder: Secondary | ICD-10-CM

## 2021-04-01 DIAGNOSIS — F5105 Insomnia due to other mental disorder: Secondary | ICD-10-CM

## 2021-04-01 MED ORDER — MIRTAZAPINE 30 MG PO TABS: 30.0000 mg | ORAL_TABLET | Freq: Every day | ORAL | 0 refills | Status: DC

## 2021-04-01 MED ORDER — ONDANSETRON HCL 8 MG TABLET
ORAL_TABLET | 2 refills | 0 days | Status: CP
Start: 2021-04-01 — End: ?

## 2021-04-01 NOTE — Progress Notes (Signed)
Meagan Walsh [place] TU:[id number] 11-10-1960 60 y.o.     Subjective:   Patient ID:  Meagan Walsh is a 60 y.o. (DOB 02/13/1961) female.  Chief Complaint:  Chief Complaint  Patient presents with   Follow-up   Depression   Bell presents to the office today for follow-up of bipolar depression.  seen in January 2020.  Mood was stable and no meds were changed.   seen May 2020.  Lithium level was 0.68.  Mood was stable and no meds were changed.  Discussed her care with her oncologist Dr. Benay Spice in June 2020.  She was dealing with pancytopenia and it was requested that she wean off lamotrigine to see if it was contributing.  That was done.  August 11, 2019 another conversation with Dr. Benay Spice.  She had elevated lithium of 2.1 on August 08, 2019.  She had lost some weight and was having some nausea and vomiting.  Lithium was held for a few days then restarted at 300 mg daily.  Lithium levels have been checked frequently since that time.  seen September 24, 2019.  She was not depressed but struggling with nausea and poor appetite and the following changes were made to see if it would help. In view of chronic nausea and weight loss despite med changes consider Trintellix.  Trintellix does cause nausea about 10% of people. It was tolerated in the past. Reduce Trintellix to 10 mg daily. Add mirtazapine 30 mg tablet at night which could help appetite and potentially replace Trintellix. If nausea is not resolved at follow up then stop Trintellix.  Call if depression worsens.  10/29/2019 appt noted: Did fine with med changes.  No problems with reduction in Trintellix.  Gained 11# since here.  At a good weight.  Wonders about stopping it for awhile. Good appetite.  No nausea now.  Still struggling with  CLL.  Needs bone marrow transplant but hasn't been able to occur yet and will be done in [place] with her living there for 3 mos.  Tired of being in limbo.  Rare Xanax used  as nausea med.  Using Ambien at HS.  Sleep alright.   In spite of stress has not been depressed.  Patient reports stable mood and denies depressed or irritable moods.  Patient denies any recent difficulty with anxiety.  Patient denies difficulty with sleep initiation or maintenance with Ambien. Denies appetite disturbance.  No weight loss.  Patient reports that energy and motivation have been good.  Patient denies any difficulty with concentration.  Patient denies any suicidal ideation. Plan: Continue Trintellix to 10 mg daily. Continue or stop mirtazapine 30 mg tablet at night per her request. Weight loss resolved.  10/22/2020 appt noted: 2nd bone marrow transplant in [place] in Jan. Will be there at least a couple of more months. Pending lithium level from today. Lost sig weight this year about 108#.  Feeding tube. Restarted mirtazapine 30 HS about a week ago. Has had a lot of nausea.   Takes alprazolam this week DT panicky feelings like walls caving in.  Worry over wasting away.  Seemed to help her.  Last day or 2 only needed one.  Don't think I've been depressed, certainly not like in the past.  More like anxiety feeling trapped.  In general feels ok. Still on trintellix 10 HS, lithium 300 mg HS, Ambien 5-10 mg HS. Sleep is pretty good.  Now labs 3 times per week and adjusting  antirejection med.   Tolerating psych meds as far as known bc no history of SE with psych meds.  03/25/2021 TC with oncology: Return telephone call to oncologist at [facility] Dr. Theodoro Clock. Reports that the patient is not eating well and has required a G-tube as well as a central line.  Dr. Theodoro Clock believes the patient has developed anorexia nervosa and is restricting not only calories but also fluids.  There is no other reasons for the gastric tube or further central line other than maintaining hydration and nutrition because she will not take in enough orally. The patient is no longer on mirtazapine but is still  on Trintellix.  Suggested the oncologist restart the mirtazapine 30 mg nightly because it helped the patient gain weight before.  We will get her in to the office as soon as possible.  If in fact she does have symptoms of any her anorexia nervosa then she will need more intensive therapy.  Dr. Theodoro Clock believes she may need an inpatient anorexia nervosa program.  04/01/2021 appt noted: Covid 03/04/21.   She's aware oncology is concerned she might have anorexia.  Hard time gaining weight.  Full so fast and when try to push it.  When in hospital went so long without eating.  Has feeding tube and high calorie food gives her diarrhea. Doesn't remember why she stopped mirtazapine. "I'm too thin" and hate the way I look.  Doesn't want to lose more bc of health danger.  No thirst and relying on Pick line for hydration getting IV fluids daily.   No history of EDO when younger.  Not depressed.  Not anxious unusuallly.  It is better now.  Sleep is OK and about 9 hours nightly.  Still takes Ambien to go to sleep.  2 severe episodes depression in life that last a full year.  Multiple failed prior medication trials include but are not limited to the following:   duloxetine, Viibryd,  Trintellix 20 mg failed until lithium was added Rexulti 0.5 mg caused restlessness, Abilify, Vraylar, Mirtazapine 30 helped appetite.  Review of Systems:  Review of Systems  Constitutional:  Positive for activity change, appetite change, fatigue and unexpected weight change.  Gastrointestinal:  Positive for diarrhea and nausea. Negative for vomiting.  Neurological:  Negative for tremors and weakness.  Psychiatric/Behavioral:  Negative for agitation, behavioral problems, confusion, decreased concentration, dysphoric mood, hallucinations, self-injury, sleep disturbance and suicidal ideas. The patient is not nervous/anxious and is not hyperactive.    Medications: I have reviewed the patient's current medications.  Current  Outpatient Medications  Medication Sig Dispense Refill   ALPRAZolam (XANAX) 0.25 MG tablet TAKE 1 TO 2 TABLETS BY MOUTH EVERY DAY AS NEEDED FOR ANXIETY 30 tablet 3   cefdinir (OMNICEF) 300 MG capsule Take 300 mg by mouth in the morning and at bedtime.     famotidine (PEPCID) 20 MG tablet Take 1 tablet (20 mg total) by mouth 2 (two) times daily. 60 tablet 2   folic acid (FOLVITE) 1 MG tablet TAKE 1 TABLET BY MOUTH EVERY DAY 90 tablet 0   levothyroxine (SYNTHROID) 100 MCG tablet TAKE 1 TABLET BY MOUTH EVERY DAY 30 tablet 2   lithium 300 MG tablet Take 1 tablet (300 mg total) by mouth at bedtime. 90 tablet 0   Magnesium 200 MG TABS Take 200 mg by mouth.     Maribavir 200 MG TABS Take by mouth in the morning and at bedtime.     ondansetron (ZOFRAN) 8  MG tablet Take 8 mg by mouth in the morning and at bedtime. Takes before am and pm meds     Polyethylene Glycol 3350 (MIRALAX PO) Take 17 g by mouth daily as needed.     prochlorperazine (COMPAZINE) 5 MG tablet TAKE 1 TO 2 TABLETS EVERY 6 HOURS AS NEEDED FOR NAUSEA OR VOMITING FOR UP TO 3 DAYS 60 tablet 1   TRINTELLIX 10 MG TABS tablet TAKE 1 TABLET BY MOUTH EVERY DAY 90 tablet 0   valGANciclovir (VALCYTE) 450 MG tablet Take by mouth daily.     zolpidem (AMBIEN) 10 MG tablet TAKE 1 TABLET BY MOUTH EVERYDAY AT BEDTIME 90 tablet 0   acetaminophen (TYLENOL) 325 MG tablet Take 650 mg by mouth every 6 (six) hours as needed. (Patient not taking: No sig reported)     mirtazapine (REMERON) 30 MG tablet Take 1 tablet (30 mg total) by mouth at bedtime. 90 tablet 0   mycophenolate (CELLCEPT) 500 MG tablet Take by mouth 2 (two) times daily. (Patient not taking: Reported on 04/01/2021)     posaconazole (NOXAFIL) 100 MG TBEC delayed-release tablet Take 300 mg by mouth daily. (Patient not taking: Reported on 04/01/2021)     predniSONE (DELTASONE) 20 MG tablet Take 60 mg by mouth daily. (Patient not taking: No sig reported)     PROMACTA 50 MG tablet Take 50 mg by mouth  daily. (Patient not taking: No sig reported)     tacrolimus (PROGRAF) 0.5 MG capsule Take 1.5 mg by mouth in the morning and at bedtime. (Patient not taking: Reported on 04/01/2021)     valACYclovir (VALTREX) 500 MG tablet Take 500 mg by mouth daily. (Patient not taking: No sig reported)     No current facility-administered medications for this visit.   Facility-Administered Medications Ordered in Other Visits  Medication Dose Route Frequency Provider Last Rate Last Admin   0.9 %  sodium chloride infusion (Manually program via Guardrails IV Fluids)  250 mL Intravenous Once Ladell Pier, MD       0.9 %  sodium chloride infusion (Manually program via Guardrails IV Fluids)  250 mL Intravenous Once Ladell Pier, MD       heparin lock flush 100 unit/mL  250 Units Intracatheter PRN Ladell Pier, MD       sodium chloride flush (NS) 0.9 % injection 10 mL  10 mL Intracatheter PRN Ladell Pier, MD   10 mL at 02/21/19 0945   sodium chloride flush (NS) 0.9 % injection 10 mL  10 mL Intracatheter PRN Ladell Pier, MD       SUMAtriptan (IMITREX) nasal spray 20 mg  20 mg Nasal Once Ladell Pier, MD        Medication Side Effects: None  Allergies: No Known Allergies  Past Medical History:  Diagnosis Date   Anxiety    Cancer ([place])    CLL   Degenerative disk disease    Neck--chronic pain   Depression    Fibroids    Uterine   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   Hypothyroidism    Lymphocytosis    Mitral valve prolapse    Mitral valve prolapse    Thyroid disease    Hypothyroid    Family History  Problem Relation Age of Onset   Breast cancer Mother    Prostate cancer Father    Colon cancer Neg Hx    Esophageal cancer Neg Hx  Pancreatic cancer Neg Hx    Stomach cancer Neg Hx     Social History   Socioeconomic History   Marital status: Married    Spouse name: Not on file   Number of children: Not on file   Years of education:  Not on file   Highest education level: Not on file  Occupational History   Not on file  Tobacco Use   Smoking status: Never   Smokeless tobacco: Never  Vaping Use   Vaping Use: Not on file  Substance and Sexual Activity   Alcohol use: Not Currently    Comment: 2 glasses of wine 2 times per week    Drug use: No   Sexual activity: Not on file  Other Topics Concern   Not on file  Social History Narrative   Pt lives in 2 story home with her husband   Has 3 adult children   Masters degree   Works at [employer] Strain: Not on [employer] Insecurity: Not on file  Transportation Needs: Not on file  Physical Activity: Not on file  Stress: Not on file  Social Connections: Not on file  Intimate Partner Violence: Not on file    Past Medical History, Surgical history, Social history, and Family history were reviewed and updated as appropriate.   Please see review of systems for further details on the patient's review from today.   Objective:   Physical Exam:  There were no vitals taken for this visit.  Physical Exam Constitutional:      General: She is not in acute distress.    Appearance: She is well-developed.  Musculoskeletal:        General: No deformity.  Neurological:     Mental Status: She is alert and oriented to person, place, and time.     Motor: No tremor.     Coordination: Coordination normal.     Gait: Gait normal.  Psychiatric:        Attention and Perception: Attention and perception normal.        Mood and Affect: Mood is anxious. Mood is not depressed. Affect is not labile, blunt, angry, tearful or inappropriate.        Speech: Speech normal. Speech is not slurred.        Behavior: Behavior normal.        Thought Content: Thought content normal. Thought content does not include homicidal or suicidal ideation. Thought content does not include homicidal or suicidal plan.        Cognition and Memory:  Cognition normal.        Judgment: Judgment normal.     Comments: Insight intact. No auditory or visual hallucinations. No delusions.     Lab Review:     Component Value Date/Time   NA 142 03/16/2021 0917   K 3.4 (L) 03/16/2021 0917   CL 111 03/16/2021 0917   CO2 23 03/16/2021 0917   GLUCOSE 140 (H) 03/16/2021 0917   BUN 26 (H) 03/16/2021 0917   CREATININE 1.47 (H) 03/16/2021 0917   CALCIUM 9.0 03/16/2021 0917   PROT 5.9 (L) 03/16/2021 0917   ALBUMIN 3.8 03/16/2021 0917   AST 13 (L) 03/16/2021 0917   ALT 13 03/16/2021 0917   ALKPHOS 69 03/16/2021 0917   BILITOT 0.4 03/16/2021 0917   GFRNONAA 40 (L) 03/16/2021 0917   GFRAA >60 12/17/2019 0830       Component Value Date/Time  WBC 5.3 03/16/2021 0917   WBC 9.9 03/31/2019 0845   RBC 2.79 (L) 03/16/2021 0917   HGB 9.4 (L) 03/16/2021 0917   HGB 14.0 06/27/2016 1038   HCT 29.2 (L) 03/16/2021 0917   HCT 42.0 06/27/2016 1038   PLT 57 (L) 03/16/2021 0917   PLT 205 06/27/2016 1038   MCV 104.7 (H) 03/16/2021 0917   MCV 92.9 06/27/2016 1038   MCH 33.7 03/16/2021 0917   MCHC 32.2 03/16/2021 0917   RDW 13.0 03/16/2021 0917   RDW 12.2 06/27/2016 1038   LYMPHSABS 1.1 03/16/2021 0917   LYMPHSABS 21.5 (H) 06/27/2016 1038   MONOABS 0.2 03/16/2021 0917   MONOABS 0.5 06/27/2016 1038   EOSABS 0.3 03/16/2021 0917   EOSABS 0.2 06/27/2016 1038   BASOSABS 0.0 03/16/2021 0917   BASOSABS 0.1 06/27/2016 1038    Lithium Lvl  Date Value Ref Range Status  12/11/2019 0.32 (L) 0.60 - 1.20 mmol/L Final    Comment:    Performed at [facility], [address] [place], [place] [address]    Component 08/24/20 08/14/20 04/02/20 01/07/20  Lithium Lvl 0.4 0.6 0.8 0.6   on 300 mg daily  No results found for: PHENYTOIN, PHENOBARB, VALPROATE, CBMZ   .res Assessment: Plan:    Depression, major, recurrent, in complete remission ([place]) - Plan: Lithium level  Loss of weight - Plan: mirtazapine (REMERON) 30 MG tablet  PTSD  (post-traumatic stress disorder)  Panic disorder with agoraphobia  Insomnia due to mental condition   Greater than 50% of 50 minface to face time with patient was spent on counseling and coordination of care. We discussed Rupal has had history of 2 very severe prolonged depressions that failed to respond to multiple psychiatric medications but then responded to lithium potentiation.  She has been through the past 2 winters with no difficulty.  She has a very strong seasonal pattern.  Did well with reduction in Trintellix from 20 to 10 mg and addition of mirtazapine to help nausea and weight gain in the past.    Not recently on mirtazapine and not sure why she stopped it.  And this was discussed with oncologist.   Here for FU and to discuss oncology concerns she has anorexia. FM to phone calls with Dr. Theodoro Clock at the oncology service at [facility] about the patient's anorectic behaviors and also refusal to drink adequate amounts of water leading to dehydration.  This is led to the necessity to use a PICC line to maintain hydration and a feeding tube to maintain adequate calorie intake.  Extensive discussion around these behaviors.  She strongly denies depression.  She minimizes the extent to which anxiety might be contributing to her reluctance to eat or drink sufficiently.  She strongly denies a desire to lose weight or even to maintain this low weight.  She states she wants to gain weight.  She has difficulty giving an adequate explanation for poor per poor intake other than she is just not hungry.  And she is not thirsty.  Component 08/24/20 08/14/20 04/02/20 01/07/20  Lithium Lvl 0.4 0.6 0.8 0.6  On 300 mg daily.  No recent level and we will obtain a lithium level  Still doing ok with depression.  She wondered about possibly discontinuing or changing other medications.  She wonders if she really needs the Trintellix.  However after discussion we both agree now would not be a good time  to change medication in view of pending bone marrow transplant  in the next couple of months.  That will require her to be in [facility] for at least 2 to 3 months.  However we did discuss she could discontinue the mirtazapine if she wishes as long as her appetite and nausea do not develop into problems Again. Continue Trintellix to 10 mg daily and lithium 300 mg HS  Weight loss returned off mirtazapine.  She needs to continue mirtazapine 30 mg nightly.  Extensive discussion of her need to gain weight and maintain adequate hydration.  Oncology reports there is no medical explanation for this problem.  The patient will need inpatient treatment if she cannot accomplish this with the use of mirtazapine and behavioral changes.  We discussed behavioral strategies to gain weight and maintain adequate hydration.  She indicates a willingness to pursue them immediately.  Counseled patient regarding potential benefits, risks, and side effects of lithium to include potential risk of lithium affecting thyroid and renal function.  Discussed need for periodic lab monitoring to determine drug level and to assess for potential adverse effects.  Counseled patient regarding signs and symptoms of lithium toxicity and advised that they notify office immediately or seek urgent medical attention if experiencing these signs and symptoms.  Patient advised to contact office with any questions or concerns.   We discussed the short-term risks associated with benzodiazepines including sedation and increased fall risk among others.  Discussed long-term side effect risk including dependence, potential withdrawal symptoms, and the potential eventual dose-related risk of dementia.  But recent studies from 2020 dispute this association between benzodiazepines and dementia risk. Newer studies in 2020 do not support an association with dementia. Disc ways to taper if needed.  FU 2 weeks because of the urgency of her medical situation and  the need to improve p.o. intake  After the appointment a follow-up phone conversation was made with Dr. Theodoro Clock, oncology at [facility].  They had noted unusually dependent behaviors on the part of the patient in regard to her family and are concerned that their underlying psychological issues that are causing her to maintain her sick status.  If this does not change markedly by her follow-up in 2 weeks we will recommend inpatient treatment.  In the meantime she was given the names of eating disorder specialists in the area and it was suggested she pursue therapy on an outpatient basis as soon as possible  Lynder Parents, MD, DFAPA   Please see After Visit Summary for patient specific instructions.  Future Appointments  Date Time Provider [place]  04/06/2021  8:45 AM [facility] PHLEBOTOMIST [facility] None  04/06/2021  9:00 AM [facility] FLUSH ROOM [facility] None  04/13/2021  8:45 AM [facility] PHLEBOTOMIST [facility] None  04/13/2021  9:00 AM [facility] FLUSH ROOM [facility] None  04/15/2021 10:30 AM Cottle, Billey Co., MD [facility] None  04/20/2021  8:45 AM [facility] PHLEBOTOMIST [facility] None  04/20/2021  9:00 AM [facility] FLUSH ROOM [facility] None  04/27/2021  8:45 AM [facility] PHLEBOTOMIST [facility] None  04/27/2021  9:00 AM [facility] FLUSH ROOM [facility] None  05/04/2021  8:45 AM [facility] PHLEBOTOMIST [facility] None  05/04/2021  9:00 AM [facility] FLUSH ROOM [facility] None    Orders Placed This Encounter  Procedures   Lithium level      -------------------------------

## 2021-04-01 NOTE — Patient Instructions (Addendum)
Meagan Harm, PhD  Meagan Walsh, eating disorder specialist  Check lithium level  Start mirtazapine 30 mg tablet, 1 at night

## 2021-04-04 ENCOUNTER — Encounter: Payer: Self-pay | Admitting: Psychiatry

## 2021-04-04 DIAGNOSIS — Z9481 Bone marrow transplant status: Principal | ICD-10-CM

## 2021-04-05 ENCOUNTER — Ambulatory Visit: Admit: 2021-04-05 | Discharge: 2021-04-06 | Payer: PRIVATE HEALTH INSURANCE

## 2021-04-05 ENCOUNTER — Other Ambulatory Visit: Admit: 2021-04-05 | Discharge: 2021-04-06 | Payer: PRIVATE HEALTH INSURANCE

## 2021-04-05 DIAGNOSIS — F325 Major depressive disorder, single episode, in full remission: Principal | ICD-10-CM

## 2021-04-05 DIAGNOSIS — D849 Immunodeficiency, unspecified: Principal | ICD-10-CM

## 2021-04-05 DIAGNOSIS — C9110 Chronic lymphocytic leukemia of B-cell type not having achieved remission: Principal | ICD-10-CM

## 2021-04-06 ENCOUNTER — Inpatient Hospital Stay: Payer: BC Managed Care – PPO

## 2021-04-06 ENCOUNTER — Other Ambulatory Visit: Payer: Self-pay | Admitting: *Deleted

## 2021-04-06 ENCOUNTER — Telehealth: Payer: Self-pay | Admitting: *Deleted

## 2021-04-06 DIAGNOSIS — C9110 Chronic lymphocytic leukemia of B-cell type not having achieved remission: Secondary | ICD-10-CM

## 2021-04-06 DIAGNOSIS — Z9484 Stem cells transplant status: Principal | ICD-10-CM

## 2021-04-06 MED ORDER — TACROLIMUS 1 MG CAPSULE, IMMEDIATE-RELEASE
ORAL_CAPSULE | Freq: Two times a day (BID) | ORAL | 2 refills | 30 days | Status: CP
Start: 2021-04-06 — End: ?
  Filled 2021-04-06: qty 90, 30d supply, fill #0

## 2021-04-06 NOTE — Telephone Encounter (Signed)
Patient had declined lab on 9/16 saying she already had lab at [facility] this week. Called patient and left VM reporting that Dr. Theodoro Clock called Dr. Benay Spice and wanted labs again on 9/16 due to her concern with results this week. Requested she call scheduling to set this up. Added appointment on 9/21 for her to see dietician after OV.

## 2021-04-06 NOTE — Progress Notes (Signed)
Per Dr. Benay Spice:  BMP pm 9/16 and CBC/diff + BMP on 9/21 with OV. Scheduling message sent and lab orders in Epic.

## 2021-04-07 ENCOUNTER — Telehealth: Payer: Self-pay | Admitting: *Deleted

## 2021-04-07 NOTE — Telephone Encounter (Signed)
Discussed need for labs on 9/16 with patient and she agrees to come in. Appointment scheduled.

## 2021-04-08 ENCOUNTER — Inpatient Hospital Stay: Payer: BC Managed Care – PPO | Attending: Oncology

## 2021-04-08 ENCOUNTER — Other Ambulatory Visit: Payer: Self-pay

## 2021-04-08 ENCOUNTER — Telehealth: Payer: Self-pay

## 2021-04-08 DIAGNOSIS — D619 Aplastic anemia, unspecified: Secondary | ICD-10-CM | POA: Diagnosis present

## 2021-04-08 DIAGNOSIS — R944 Abnormal results of kidney function studies: Secondary | ICD-10-CM | POA: Diagnosis not present

## 2021-04-08 DIAGNOSIS — C9110 Chronic lymphocytic leukemia of B-cell type not having achieved remission: Secondary | ICD-10-CM | POA: Insufficient documentation

## 2021-04-08 DIAGNOSIS — Z9484 Stem cells transplant status: Secondary | ICD-10-CM | POA: Insufficient documentation

## 2021-04-08 DIAGNOSIS — Z79899 Other long term (current) drug therapy: Secondary | ICD-10-CM | POA: Insufficient documentation

## 2021-04-08 LAB — CBC WITH DIFFERENTIAL (CANCER CENTER ONLY)
Abs Immature Granulocytes: 0.01 10*3/uL (ref 0.00–0.07)
Basophils Absolute: 0 10*3/uL (ref 0.0–0.1)
Basophils Relative: 0 %
Eosinophils Absolute: 0.3 10*3/uL (ref 0.0–0.5)
Eosinophils Relative: 5 %
HCT: 28.5 % — ABNORMAL LOW (ref 36.0–46.0)
Hemoglobin: 9.1 g/dL — ABNORMAL LOW (ref 12.0–15.0)
Immature Granulocytes: 0 %
Lymphocytes Relative: 17 %
Lymphs Abs: 1.2 10*3/uL (ref 0.7–4.0)
MCH: 34 pg (ref 26.0–34.0)
MCHC: 31.9 g/dL (ref 30.0–36.0)
MCV: 106.3 fL — ABNORMAL HIGH (ref 80.0–100.0)
Monocytes Absolute: 0.2 10*3/uL (ref 0.1–1.0)
Monocytes Relative: 3 %
Neutro Abs: 5.1 10*3/uL (ref 1.7–7.7)
Neutrophils Relative %: 75 %
Platelet Count: 87 10*3/uL — ABNORMAL LOW (ref 150–400)
RBC: 2.68 MIL/uL — ABNORMAL LOW (ref 3.87–5.11)
RDW: 14.6 % (ref 11.5–15.5)
WBC Count: 6.8 10*3/uL (ref 4.0–10.5)
nRBC: 0 % (ref 0.0–0.2)

## 2021-04-08 LAB — CMP (CANCER CENTER ONLY)
ALT: 11 U/L (ref 0–44)
AST: 13 U/L — ABNORMAL LOW (ref 15–41)
Albumin: 4.3 g/dL (ref 3.5–5.0)
Alkaline Phosphatase: 66 U/L (ref 38–126)
Anion gap: 7 (ref 5–15)
BUN: 30 mg/dL — ABNORMAL HIGH (ref 8–23)
CO2: 25 mmol/L (ref 22–32)
Calcium: 9.6 mg/dL (ref 8.9–10.3)
Chloride: 107 mmol/L (ref 98–111)
Creatinine: 2.04 mg/dL — ABNORMAL HIGH (ref 0.44–1.00)
GFR, Estimated: 27 mL/min — ABNORMAL LOW (ref >60)
Glucose, Bld: 146 mg/dL — ABNORMAL HIGH (ref 70–99)
Potassium: 4 mmol/L (ref 3.5–5.1)
Sodium: 139 mmol/L (ref 135–145)
Total Bilirubin: 0.4 mg/dL (ref 0.3–1.2)
Total Protein: 6.6 g/dL (ref 6.5–8.1)

## 2021-04-08 NOTE — Telephone Encounter (Signed)
-----   Message from Ladell Pier, MD sent at 04/08/2021  2:20 PM EDT ----- Please call patient, CBC looks good, creatinine is elevated-could be related to a component of dehydration, she needs to push fluids over the weekend, repeat BMP on 04/11/2021

## 2021-04-08 NOTE — Telephone Encounter (Signed)
TC to Pt relayed message from Dr. Benay Spice. Pt informed to push fluids and lab appointment scheduled on Monday. Pt verbalized understanding.

## 2021-04-11 ENCOUNTER — Inpatient Hospital Stay: Payer: BC Managed Care – PPO

## 2021-04-11 ENCOUNTER — Other Ambulatory Visit: Payer: Self-pay

## 2021-04-11 ENCOUNTER — Telehealth: Payer: Self-pay

## 2021-04-11 DIAGNOSIS — C9110 Chronic lymphocytic leukemia of B-cell type not having achieved remission: Secondary | ICD-10-CM | POA: Diagnosis not present

## 2021-04-11 LAB — BASIC METABOLIC PANEL - CANCER CENTER ONLY
Anion gap: 7 (ref 5–15)
BUN: 30 mg/dL — ABNORMAL HIGH (ref 8–23)
CO2: 24 mmol/L (ref 22–32)
Calcium: 9.7 mg/dL (ref 8.9–10.3)
Chloride: 108 mmol/L (ref 98–111)
Creatinine: 1.58 mg/dL — ABNORMAL HIGH (ref 0.44–1.00)
GFR, Estimated: 37 mL/min — ABNORMAL LOW (ref >60)
Glucose, Bld: 117 mg/dL — ABNORMAL HIGH (ref 70–99)
Potassium: 4.4 mmol/L (ref 3.5–5.1)
Sodium: 139 mmol/L (ref 135–145)

## 2021-04-11 LAB — TACROLIMUS LEVEL: Tacrolimus (FK506) - LabCorp: 2.4 ng/mL (ref 2.0–20.0)

## 2021-04-11 NOTE — Telephone Encounter (Signed)
-----   Message from Ladell Pier, MD sent at 04/11/2021 12:10 PM EDT ----- Please call patient, creatinine is better today, continue to push fluids, follow-up as scheduled

## 2021-04-12 MED FILL — VALACYCLOVIR 500 MG TABLET, ORA-SWEET SF ORAL LIQUID, ORA-PLUS ORAL SUSPENSION: ORAL | 20 days supply | Qty: 200 | Fill #0

## 2021-04-13 ENCOUNTER — Other Ambulatory Visit: Payer: Self-pay

## 2021-04-13 ENCOUNTER — Inpatient Hospital Stay: Payer: BC Managed Care – PPO

## 2021-04-13 ENCOUNTER — Encounter: Payer: Self-pay | Admitting: Nurse Practitioner

## 2021-04-13 ENCOUNTER — Inpatient Hospital Stay (HOSPITAL_BASED_OUTPATIENT_CLINIC_OR_DEPARTMENT_OTHER): Payer: BC Managed Care – PPO | Admitting: Nurse Practitioner

## 2021-04-13 ENCOUNTER — Inpatient Hospital Stay: Payer: BC Managed Care – PPO | Admitting: Nutrition

## 2021-04-13 VITALS — BP 133/83 | HR 93 | Temp 98.2°F | Resp 18 | Ht 66.0 in | Wt 95.0 lb

## 2021-04-13 DIAGNOSIS — C9110 Chronic lymphocytic leukemia of B-cell type not having achieved remission: Secondary | ICD-10-CM

## 2021-04-13 LAB — CBC WITH DIFFERENTIAL (CANCER CENTER ONLY)
Abs Immature Granulocytes: 0.03 10*3/uL (ref 0.00–0.07)
Basophils Absolute: 0 10*3/uL (ref 0.0–0.1)
Basophils Relative: 0 %
Eosinophils Absolute: 0.3 10*3/uL (ref 0.0–0.5)
Eosinophils Relative: 4 %
HCT: 29.6 % — ABNORMAL LOW (ref 36.0–46.0)
Hemoglobin: 9.2 g/dL — ABNORMAL LOW (ref 12.0–15.0)
Immature Granulocytes: 1 %
Lymphocytes Relative: 16 %
Lymphs Abs: 1 10*3/uL (ref 0.7–4.0)
MCH: 33.3 pg (ref 26.0–34.0)
MCHC: 31.1 g/dL (ref 30.0–36.0)
MCV: 107.2 fL — ABNORMAL HIGH (ref 80.0–100.0)
Monocytes Absolute: 0.2 10*3/uL (ref 0.1–1.0)
Monocytes Relative: 4 %
Neutro Abs: 4.7 10*3/uL (ref 1.7–7.7)
Neutrophils Relative %: 75 %
Platelet Count: 104 10*3/uL — ABNORMAL LOW (ref 150–400)
RBC: 2.76 MIL/uL — ABNORMAL LOW (ref 3.87–5.11)
RDW: 14.6 % (ref 11.5–15.5)
WBC Count: 6.3 10*3/uL (ref 4.0–10.5)
nRBC: 0 % (ref 0.0–0.2)

## 2021-04-13 LAB — BASIC METABOLIC PANEL - CANCER CENTER ONLY
Anion gap: 8 (ref 5–15)
BUN: 36 mg/dL — ABNORMAL HIGH (ref 8–23)
CO2: 24 mmol/L (ref 22–32)
Calcium: 9.7 mg/dL (ref 8.9–10.3)
Chloride: 108 mmol/L (ref 98–111)
Creatinine: 1.89 mg/dL — ABNORMAL HIGH (ref 0.44–1.00)
GFR, Estimated: 30 mL/min — ABNORMAL LOW (ref >60)
Glucose, Bld: 109 mg/dL — ABNORMAL HIGH (ref 70–99)
Potassium: 4.6 mmol/L (ref 3.5–5.1)
Sodium: 140 mmol/L (ref 135–145)

## 2021-04-13 NOTE — Progress Notes (Signed)
[facility] OFFICE PROGRESS NOTE   Diagnosis: CLL, aplastic anemia  INTERVAL HISTORY:   Ms. Mauch returns for follow-up.  Overall she feels well.  Appetite is improving since she began Remeron.  She is trying to gain weight.  No nausea or vomiting.  Bowels moving regularly with intermittent constipation.  No bleeding.  Objective:  Vital signs in last 24 hours:  Blood pressure 133/83, pulse 93, temperature 98.2 F (36.8 C), temperature source Oral, resp. rate 18, height _0  (1.676 m), weight 95 lb (43.1 kg), SpO2 100 %.    HEENT: Bitemporal wasting. Lymphatics: No palpable cervical, supraclavicular, axillary or inguinal lymph nodes. Resp: Lungs clear bilaterally. Cardio: Regular rate and rhythm. GI: Abdomen soft and nontender.  No hepatosplenomegaly.  Left abdomen feeding tube site is without erythema. Vascular: No leg edema. Neuro: Alert and oriented.  Lab Results:  Lab Results  Component Value Date   WBC 6.3 04/13/2021   HGB 9.2 (L) 04/13/2021   HCT 29.6 (L) 04/13/2021   MCV 107.2 (H) 04/13/2021   PLT 104 (L) 04/13/2021   NEUTROABS 4.7 04/13/2021    Imaging:  No results found.  Medications: I have reviewed the patient's current medications.  Assessment/Plan:  1. CLL- diagnosed in August 2010, flow cytometry consistent with CLL Enlarged left inguinal lymph node January 2019, small neck/axillary nodes and palpable splenomegaly 09/11/2017 CTs on 09/17/2017-3 cm necrotic appearing lymph node in the left inguinal region, borderline enlarged pelvic/retroperitoneal, chest, and axillary nodes.  Mild splenomegaly. 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, no evidence of p53 (17p13) deletion, no evidence of 11q22 deletion Bone marrow biopsy 02/26/2018-hypercellular marrow with extensive involvement by CLL, lymphocytes represent 85% of all cells Ibrutinib initiated 04/03/2018 Ibrutinib placed on hold 04/11/2018 due to onset of arthralgias Ibrutinib resumed 04/16/2018, discontinued 04/25/2018 secondary to severe arthralgias/arthritis Ibrutinib resumed at a dose of 140 mg daily 05/03/2018 Ibrutinib dose adjusted to 140 mg alternating with 280 mg 06/25/2018 Ibrutinib discontinued 07/03/2018 secondary to severe arthralgias Acalabrutinib 08/16/2018, discontinued 11/15/2018 secondary to persistent severe transfusion dependent anemia and neutropenia/thrombocytopenia, last dose 11/14/2018 Bone marrow biopsy 11/21/2018- decreased cellularity, involvement by CLL, decreased erythroid and granulocytic precursors, decreased megakaryocytes Cycle 1 rituximab 12/06/2018 Bone marrow biopsy 12/24/2018 at [facility]-hypocellular bone marrow (10%) involved by CLL, representing 50% of marrow cellularity; markedly decreased trilineage hematopoiesis including essentially absent erythropoiesis Bone marrow biopsy 03/06/2027 [facility]- hypocellular bone marrow (20%) with marked reduction of maturing hematopoietic elements; numerous lymphoid aggregates consistent with involvement by CLL, representing approximately 50% of marrow cellularity by flow cytometry ATG/cyclosporine at [facility] beginning 03/19/2019 followed by prednisone taper Eltrombopag beginning 07/05/2019 Cyclosporine discontinued 07/08/2019 Tacrolimus 07/10/2019, discontinued 09/24/2019 Bone marrow biopsy 10/07/2019 at [facility] bone marrow sampling (<5% cellular) essentially devoid of hematopoietic elements; Persistent chronic lymphocytic  leukemia, representing 23% of marrow cellularity by flow cytometric analysis; Routine cytogenetic analysis is pending. CT CAP 10/10/2019 showed no acute process or evidence of active lymphoma/leukemia within             the chest, abdomen, or pelvis. Resection or resolution of previously described necrotic left  inguinal node Tacrolimus resumed 10/17/2019, discontinued on 10/28/2019 and resumed at a dose of 0.5 mg twice daily on 10/29/2019, tacrolimus escalated to 1 mg twice daily 11/14/2019 Bone marrow biopsy at [facility] 12/26/1998 21-20% cellular marrow, CLL involving approximately 20% of the cellular component Eltrombopag discontinued week of 12/29/2019 01/01/2020 -induction chemotherapy with bendamustine, fludarabine, rituximab, tacrolimus started day -3 and methotrexate given on day +1, +3, +6, and +11 Bone marrow biopsy 05/05/2020-30% cellular marrow with adequate megakaryocytes, increased erythropoiesis and diminished granulopoiesis, 3-5% blasts, cells are 94% donor origin   2. Hypothyroidism 3. Hepatitis B surface and core antibody positive Hepatitis B surface antigen negative and hepatitis B core antibody -12/02/2018 4.  Left lung pneumonia diagnosed 10/08/2017-completed 7 days of Levaquin 5.  Left lung pneumonia on chest x-ray 12/27/2017.  Augmentin prescribed. 6.  Anemia secondary to CLL- DAT negative, bilirubin and LDH normal June 2019, progressive symptomatic anemia 04/01/2018, red cell transfusions 04/01/2018, followed by multiple additional red cell transfusions  7.  Hypogammaglobulinemia 8.  Pancytopenia secondary to CLL and a hypocellular bone marrow G-CSF and Nplate started 3/66/2947, G-CSF changed to daily beginning 12/17/2018; G-CSF discontinued 12/31/2018, last Nplate 01/15/2019  Began prednisone 60 mg daily 01/11/19, tolerating moderately well except jitteriness, irritability, and difficulty sleeping. Tapered to 40 mg daily x1 week starting 01/24/19, reduce by 10 mg each week until discontinued.   Prednisone discontinued 02/20/2019. 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  Severe headache and nausea/vomiting 02/27/2019-likely related to IVIG therapy, resolved 10.  Intermittent nausea and vomiting following cyclosporine dosing 11.  Gingival hypertrophy secondary to cyclosporine-improved 12.  MRI liver 06/12/2019-iron overload in liver and spleen.  No lymphadenopathy or splenomegaly.  Exjade beginning 06/28/2019. 13.  Elevated creatinine-cyclosporine toxicity?  Elevated 10/28/2019 with hyperkalemia-tacrolimus induced? 14.  Vaginal bleeding beginning 07/13/2019-Mirena device removed, uterine fibroids and possible endometrial fluid or thickening in the fundus per CT CAP on 10/10/19 15.  Elevated lithium level 08/08/2019, lithium toxicity?-Lithium held 08/08/2019-08/12/2019, then resumed at a lower dose 16. Deconditioning, secondary to n/v/d 17. Orthostatic hypotension 09/15/19, received 500 cc NS in clinic  Disposition: Ms. Newmann appears stable.  She underwent a second donor stem cell transplant 08/19/2020.  We reviewed the CBC from today.  Counts are stable to improved.  The basic metabolic panel from today shows creatinine elevated at 1.89.  We discussed the importance of adequate fluid intake.  We also discussed her nutritional status/need for weight gain.  She has noted improvement in appetite since beginning Remeron.  She is meeting with the [place] dietitian today.  She will return for lab and follow-up in approximately 10 days.  We are available to see her sooner if needed.  Patient seen with Dr. Benay Spice.    Ned Card ANP/GNP-BC   04/13/2021  3:21 PM  This was a shared visit with Ned Card.  Ms. Cappelli was interviewed and examined.  She continues to recover from the second allogenic bone marrow  transplant.  The CBC remains adequate.  The BUN and creatinine remain elevated.  It is unclear whether this is related to her hydration status versus renal insufficiency.  We encouraged her to increase her fluid intake by mouth and via the feeding tube.  She will meet with the Cancer center nutritionist today.  We encouraged increased calorie intake.  We will continue following her in conjunction  with the [facility] transplant team.  I was present for greater than 50% of today's visit.  I performed medical decision making.  [name], MD

## 2021-04-13 NOTE — Progress Notes (Signed)
60 year old female diagnosed with CLL.  Patient was seen by this RD on Aug 11, 2019.  PMH includes anxiety, anorexia, bipolar depression, Hypothyroidism, Mitral valve prolapse.  Medications include Xanax, Pepcid, Folvite, Synthroid, Magnesium, Remeron, Zofran, Miralax, Compazine.   Labs include Glucose 109, BUN 36, Creat 1.89  Height: 5"6" Weight: 95 pounds UBW: Weighed 111.9 pounds Aug 10, 2020. BMI: 15.33.  Noted history of chronic nausea and vomiting. She has had continued weight loss and is underweight with BMI of 15.33. She has a 15% wt loss over 9 months. Reports she has been trying to eat and drink more. She has no appetite and no thirst to remind her to eat and drink. She verbalizes desire and need to increase food and liquids. She still has a feeding tube and flushes it every day. She states she was told to put 1 1/4 carton of tube feeding per feeding and she felt too full. She doesn't really like nutrition supplements but says carnation breakfast is good.  Nutrition Diagnosis: Unintended weight loss related to inadequate oral intake as evidenced by % wt loss and BMI of 15 as well as severe muscle and fat loss. Patient meets criteria for severe malnutrition.  Intervention: Encouraged small frequent meals and snacks;every 2-3 hours. Set a timer to remind her to eat and drink. Try carnation breakfast essentials between meals. Increase TID. Increase fluid intake. Consider 8 oz water via tube three times daily between meals. If desired to use tube for nutrition support, give 4 oz tube feeding at a time with free water before and after.  Reviewed snacks.  Monitoring, Evaluation, Goals: Patient will increase calories and protein to promote slow wt gain of lean body mass. Will increase fluids to provide adequate hydration.  Follow up to be scheduled as needed.

## 2021-04-14 ENCOUNTER — Encounter: Payer: Self-pay | Admitting: Oncology

## 2021-04-14 DIAGNOSIS — B259 Cytomegaloviral disease, unspecified: Principal | ICD-10-CM

## 2021-04-15 ENCOUNTER — Other Ambulatory Visit: Payer: Self-pay

## 2021-04-15 ENCOUNTER — Encounter: Payer: Self-pay | Admitting: Psychiatry

## 2021-04-15 ENCOUNTER — Telehealth (INDEPENDENT_AMBULATORY_CARE_PROVIDER_SITE_OTHER): Payer: BC Managed Care – PPO | Admitting: Psychiatry

## 2021-04-15 DIAGNOSIS — R634 Abnormal weight loss: Secondary | ICD-10-CM | POA: Diagnosis not present

## 2021-04-15 DIAGNOSIS — F4001 Agoraphobia with panic disorder: Secondary | ICD-10-CM | POA: Diagnosis not present

## 2021-04-15 DIAGNOSIS — F4310 Post-traumatic stress disorder, unspecified: Secondary | ICD-10-CM | POA: Diagnosis not present

## 2021-04-15 DIAGNOSIS — F5105 Insomnia due to other mental disorder: Secondary | ICD-10-CM

## 2021-04-15 DIAGNOSIS — F3342 Major depressive disorder, recurrent, in full remission: Secondary | ICD-10-CM

## 2021-04-15 NOTE — Progress Notes (Signed)
Meagan Walsh [id number] 1960/08/30 60 y.o.   Video Visit via My Chart  I connected with pt by My Chart and verified that I am speaking with the correct person using two identifiers.   I discussed the limitations, risks, security and privacy concerns of performing an evaluation and management service by My Chart  and the availability of in person appointments. I also discussed with the patient that there may be a patient responsible charge related to this service. The patient expressed understanding and agreed to proceed.  I discussed the assessment and treatment plan with the patient. The patient was provided an opportunity to ask questions and all were answered. The patient agreed with the plan and demonstrated an understanding of the instructions.   The patient was advised to call back or seek an in-person evaluation if the symptoms worsen or if the condition fails to improve as anticipated.  I provided 15 minutes of video time during this encounter.  The patient was located at home and the provider was located office. Session from 1100 until 1115  Subjective:   Patient ID:  Meagan Walsh is a 60 y.o. (DOB [birth year]) female.  Chief Complaint:  Chief Complaint  Patient presents with   Follow-up   Depression   Ojus presents to the office today for follow-up of bipolar depression.  seen in January 2020.  Mood was stable and no meds were changed.   seen May 2020.  Lithium level was 0.68.  Mood was stable and no meds were changed.  Discussed her care with her oncologist Dr. Benay Spice in June 2020.  She was dealing with pancytopenia and it was requested that she wean off lamotrigine to see if it was contributing.  That was done.  August 11, 2019 another conversation with Dr. Benay Spice.  She had elevated lithium of 2.1 on August 08, 2019.  She had lost some weight and was having some nausea and vomiting.  Lithium was held for a few days then restarted at  300 mg daily.  Lithium levels have been checked frequently since that time.  seen September 24, 2019.  She was not depressed but struggling with nausea and poor appetite and the following changes were made to see if it would help. In view of chronic nausea and weight loss despite med changes consider Trintellix.  Trintellix does cause nausea about 10% of people. It was tolerated in the past. Reduce Trintellix to 10 mg daily. Add mirtazapine 30 mg tablet at night which could help appetite and potentially replace Trintellix. If nausea is not resolved at follow up then stop Trintellix.  Call if depression worsens.  10/29/2019 appt noted: Did fine with med changes.  No problems with reduction in Trintellix.  Gained 11# since here.  At a good weight.  Wonders about stopping it for awhile. Good appetite.  No nausea now.  Still struggling with  CLL.  Needs bone marrow transplant but hasn't been able to occur yet and will be done in [place] with her living there for 3 mos.  Tired of being in limbo.  Rare Xanax used as nausea med.  Using Ambien at HS.  Sleep alright.   In spite of stress has not been depressed.  Patient reports stable mood and denies depressed or irritable moods.  Patient denies any recent difficulty with anxiety.  Patient denies difficulty with sleep initiation or maintenance with Ambien. Denies appetite disturbance.  No weight loss.  Patient reports that  energy and motivation have been good.  Patient denies any difficulty with concentration.  Patient denies any suicidal ideation. Plan: Continue Trintellix to 10 mg daily. Continue or stop mirtazapine 30 mg tablet at night per her request. Weight loss resolved.  10/22/2020 appt noted: 2nd bone marrow transplant in [place] in Jan. Will be there at least a couple of more months. Pending lithium level from today. Lost sig weight this year about 108#.  Feeding tube. Restarted mirtazapine 30 HS about a week ago. Has had a lot of nausea.    Takes alprazolam this week DT panicky feelings like walls caving in.  Worry over wasting away.  Seemed to help her.  Last day or 2 only needed one.  Don't think I've been depressed, certainly not like in the past.  More like anxiety feeling trapped.  In general feels ok. Still on trintellix 10 HS, lithium 300 mg HS, Ambien 5-10 mg HS. Sleep is pretty good.  Now labs 3 times per week and adjusting antirejection med.   Tolerating psych meds as far as known bc no history of SE with psych meds.  03/25/2021 TC with oncology: Return telephone call to oncologist at [facility] Dr. Theodoro Clock. Reports that the patient is not eating well and has required a G-tube as well as a central line.  Dr. Theodoro Clock believes the patient has developed anorexia nervosa and is restricting not only calories but also fluids.  There is no other reasons for the gastric tube or further central line other than maintaining hydration and nutrition because she will not take in enough orally. The patient is no longer on mirtazapine but is still on Trintellix.  Suggested the oncologist restart the mirtazapine 30 mg nightly because it helped the patient gain weight before.  We will get her in to the office as soon as possible.  If in fact she does have symptoms of any her anorexia nervosa then she will need more intensive therapy.  Dr. Theodoro Clock believes she may need an inpatient anorexia nervosa program.  04/01/2021 appt noted: Covid 03/04/21.   She's aware oncology is concerned she might have anorexia.  Hard time gaining weight.  Full so fast and when try to push it.  When in hospital went so long without eating.  Has feeding tube and high calorie food gives her diarrhea. Doesn't remember why she stopped mirtazapine. "I'm too thin" and hate the way I look.  Doesn't want to lose more bc of health danger.  No thirst and relying on Pick line for hydration getting IV fluids daily.   No history of EDO when younger. Not depressed.  Not anxious  unusuallly.  It is better now.  Sleep is OK and about 9 hours nightly.  Still takes Ambien to go to sleep.  04/15/21 appt noted: Mirtazapine helped appetite and eaten more.  Diarrhea 2 days and have been off.  Might have gained a little.  A little better with fluids but not great.  Pick line removed.  Put fluids in GI tube.   No problems with mirtazapine. Sometimes has diarrhea but not usually. Weigh herself.   2 severe episodes depression in life that last a full year.  Multiple failed prior medication trials include but are not limited to the following:   duloxetine, Viibryd,  Trintellix 20 mg failed until lithium was added Rexulti 0.5 mg caused restlessness, Abilify, Vraylar, Mirtazapine 30 helped appetite.  Review of Systems:  Review of Systems  Constitutional:  Positive for activity  change, fatigue and unexpected weight change. Negative for appetite change.  Gastrointestinal:  Positive for diarrhea. Negative for nausea and vomiting.  Neurological:  Negative for tremors and weakness.  Psychiatric/Behavioral:  Negative for agitation, behavioral problems, confusion, decreased concentration, dysphoric mood, hallucinations, self-injury, sleep disturbance and suicidal ideas. The patient is not nervous/anxious and is not hyperactive.    Medications: I have reviewed the patient's current medications.  Current Outpatient Medications  Medication Sig Dispense Refill   ALPRAZolam (XANAX) 0.25 MG tablet TAKE 1 TO 2 TABLETS BY MOUTH EVERY DAY AS NEEDED FOR ANXIETY 30 tablet 3   famotidine (PEPCID) 20 MG tablet Take 1 tablet (20 mg total) by mouth 2 (two) times daily. 60 tablet 2   folic acid (FOLVITE) 1 MG tablet TAKE 1 TABLET BY MOUTH EVERY DAY 90 tablet 0   levothyroxine (SYNTHROID) 100 MCG tablet TAKE 1 TABLET BY MOUTH EVERY DAY 30 tablet 2   lithium 300 MG tablet Take 1 tablet (300 mg total) by mouth at bedtime. 90 tablet 0   mirtazapine (REMERON) 30 MG tablet Take 1 tablet (30 mg total) by  mouth at bedtime. 90 tablet 0   ondansetron (ZOFRAN) 8 MG tablet Take 8 mg by mouth in the morning and at bedtime. Takes before am and pm meds     Polyethylene Glycol 3350 (MIRALAX PO) Take 17 g by mouth daily as needed.     prochlorperazine (COMPAZINE) 5 MG tablet TAKE 1 TO 2 TABLETS EVERY 6 HOURS AS NEEDED FOR NAUSEA OR VOMITING FOR UP TO 3 DAYS 60 tablet 1   TRINTELLIX 10 MG TABS tablet TAKE 1 TABLET BY MOUTH EVERY DAY 90 tablet 0   zolpidem (AMBIEN) 10 MG tablet TAKE 1 TABLET BY MOUTH EVERYDAY AT BEDTIME 90 tablet 0   acetaminophen (TYLENOL) 325 MG tablet Take 650 mg by mouth every 6 (six) hours as needed. (Patient not taking: No sig reported)     Magnesium 200 MG TABS Take 200 mg by mouth. (Patient not taking: Reported on 04/15/2021)     Maribavir 200 MG TABS Take by mouth in the morning and at bedtime. (Patient not taking: Reported on 04/15/2021)     tacrolimus (PROGRAF) 0.5 MG capsule Take 1.5 mg by mouth in the morning and at bedtime. (Patient not taking: No sig reported)     No current facility-administered medications for this visit.   Facility-Administered Medications Ordered in Other Visits  Medication Dose Route Frequency Provider Last Rate Last Admin   0.9 %  sodium chloride infusion (Manually program via Guardrails IV Fluids)  250 mL Intravenous Once Ladell Pier, MD       0.9 %  sodium chloride infusion (Manually program via Guardrails IV Fluids)  250 mL Intravenous Once Ladell Pier, MD       heparin lock flush 100 unit/mL  250 Units Intracatheter PRN Ladell Pier, MD       sodium chloride flush (NS) 0.9 % injection 10 mL  10 mL Intracatheter PRN Ladell Pier, MD   10 mL at 02/21/19 0945   sodium chloride flush (NS) 0.9 % injection 10 mL  10 mL Intracatheter PRN Ladell Pier, MD       SUMAtriptan (IMITREX) nasal spray 20 mg  20 mg Nasal Once Ladell Pier, MD        Medication Side Effects: None  Allergies: No Known Allergies  Past Medical History:   Diagnosis Date   Anxiety    Cancer ([place])  CLL   Degenerative disk disease    Neck--chronic pain   Depression    Fibroids    Uterine   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   Hypothyroidism    Lymphocytosis    Mitral valve prolapse    Mitral valve prolapse    Thyroid disease    Hypothyroid    Family History  Problem Relation Age of Onset   Breast cancer Mother    Prostate cancer Father    Colon cancer Neg Hx    Esophageal cancer Neg Hx    Pancreatic cancer Neg Hx    Stomach cancer Neg Hx     Social History   Socioeconomic History   Marital status: Married    Spouse name: Not on file   Number of children: Not on file   Years of education: Not on file   Highest education level: Not on file  Occupational History   Not on file  Tobacco Use   Smoking status: Never   Smokeless tobacco: Never  Vaping Use   Vaping Use: Not on file  Substance and Sexual Activity   Alcohol use: Not Currently    Comment: 2 glasses of wine 2 times per week    Drug use: No   Sexual activity: Not on file  Other Topics Concern   Not on file  Social History Narrative   Pt lives in 2 story home with her husband   Has 3 adult children   Masters degree   Works at [employer]   Social Determinants of Radio broadcast assistant Strain: Not on Art therapist Insecurity: Not on file  Transportation Needs: Not on file  Physical Activity: Not on file  Stress: Not on file  Social Connections: Not on file  Intimate Partner Violence: Not on file    Past Medical History, Surgical history, Social history, and Family history were reviewed and updated as appropriate.   Please see review of systems for further details on the patient's review from today.   Objective:   Physical Exam:  There were no vitals taken for this visit.  Physical Exam Constitutional:      General: She is not in acute distress.    Appearance: She is well-developed.  Musculoskeletal:         General: No deformity.  Neurological:     Mental Status: She is alert and oriented to person, place, and time.     Motor: No tremor.     Coordination: Coordination normal.     Gait: Gait normal.  Psychiatric:        Attention and Perception: Attention and perception normal.        Mood and Affect: Mood is not anxious or depressed. Affect is not labile, blunt, angry, tearful or inappropriate.        Speech: Speech normal. Speech is not slurred.        Behavior: Behavior normal.        Thought Content: Thought content normal. Thought content does not include homicidal or suicidal ideation. Thought content does not include homicidal or suicidal plan.        Cognition and Memory: Cognition normal.        Judgment: Judgment normal.     Comments: Insight intact. No auditory or visual hallucinations. No delusions.     Lab Review:     Component Value Date/Time   NA 140 04/13/2021 1347   K 4.6 04/13/2021 1347  CL 108 04/13/2021 1347   CO2 24 04/13/2021 1347   GLUCOSE 109 (H) 04/13/2021 1347   BUN 36 (H) 04/13/2021 1347   CREATININE 1.89 (H) 04/13/2021 1347   CALCIUM 9.7 04/13/2021 1347   PROT 6.6 04/08/2021 1100   ALBUMIN 4.3 04/08/2021 1100   AST 13 (L) 04/08/2021 1100   ALT 11 04/08/2021 1100   ALKPHOS 66 04/08/2021 1100   BILITOT 0.4 04/08/2021 1100   GFRNONAA 30 (L) 04/13/2021 1347   GFRAA >60 12/17/2019 0830       Component Value Date/Time   WBC 6.3 04/13/2021 1347   WBC 9.9 03/31/2019 0845   RBC 2.76 (L) 04/13/2021 1347   HGB 9.2 (L) 04/13/2021 1347   HGB 14.0 06/27/2016 1038   HCT 29.6 (L) 04/13/2021 1347   HCT 42.0 06/27/2016 1038   PLT 104 (L) 04/13/2021 1347   PLT 205 06/27/2016 1038   MCV 107.2 (H) 04/13/2021 1347   MCV 92.9 06/27/2016 1038   MCH 33.3 04/13/2021 1347   MCHC 31.1 04/13/2021 1347   RDW 14.6 04/13/2021 1347   RDW 12.2 06/27/2016 1038   LYMPHSABS 1.0 04/13/2021 1347   LYMPHSABS 21.5 (H) 06/27/2016 1038   MONOABS 0.2 04/13/2021 1347    MONOABS 0.5 06/27/2016 1038   EOSABS 0.3 04/13/2021 1347   EOSABS 0.2 06/27/2016 1038   BASOSABS 0.0 04/13/2021 1347   BASOSABS 0.1 06/27/2016 1038    Lithium Lvl  Date Value Ref Range Status  12/11/2019 0.32 (L) 0.60 - 1.20 mmol/L Final    Comment:    Performed at [facility], [address] [place], [place] [address]    Component 08/24/20 08/14/20 04/02/20 01/07/20  Lithium Lvl 0.4 0.6 0.8 0.6   on 300 mg daily  No results found for: PHENYTOIN, PHENOBARB, VALPROATE, CBMZ   .res Assessment: Plan:    Depression, major, recurrent, in complete remission ([place])  Loss of weight  PTSD (post-traumatic stress disorder)  Panic disorder with agoraphobia  Insomnia due to mental condition   Greater than 50% of 50 minface to face time with patient was spent on counseling and coordination of care. We discussed Hadasa has had history of 2 very severe prolonged depressions that failed to respond to multiple psychiatric medications but then responded to lithium potentiation.  She has been through the past 2 winters with no difficulty.  She has a very strong seasonal pattern.  Did well with reduction in Trintellix from 20 to 10 mg and addition of mirtazapine to help nausea and weight gain in the past.    Extensive discussion around these behaviors.  She strongly denies depression.  She minimizes the extent to which anxiety might be contributing to her reluctance to eat or drink sufficiently.  She strongly denies a desire to lose weight or even to maintain this low weight.  She states she wants to gain weight.  She has difficulty giving an adequate explanation for poor per poor intake other than she is just not hungry.  And she is not thirsty.  Component 08/24/20 08/14/20 04/02/20 01/07/20  Lithium Lvl 0.4 0.6 0.8 0.6  On 300 mg daily.  No recent level and we will obtain a lithium level  Still doing ok with depression.  Continue Trintellix to 10 mg daily and lithium 300  mg HS  Weight loss returned off mirtazapine.  She needs to continue mirtazapine 30 mg nightly.   It has helped appetite and she is eating more. Weigh yourself weekly and focus on  weight gain.  Extensive discussion of her need to gain weight and maintain adequate hydration.  Oncology reports there is no medical explanation for this problem.  The patient will need inpatient treatment if she cannot accomplish this with the use of mirtazapine and behavioral changes.  We discussed behavioral strategies to gain weight and maintain adequate hydration.  She indicates a willingness to pursue them immediately.  Counseled patient regarding potential benefits, risks, and side effects of lithium to include potential risk of lithium affecting thyroid and renal function.  Discussed need for periodic lab monitoring to determine drug level and to assess for potential adverse effects.  Counseled patient regarding signs and symptoms of lithium toxicity and advised that they notify office immediately or seek urgent medical attention if experiencing these signs and symptoms.  Patient advised to contact office with any questions or concerns.   We discussed the short-term risks associated with benzodiazepines including sedation and increased fall risk among others.  Discussed long-term side effect risk including dependence, potential withdrawal symptoms, and the potential eventual dose-related risk of dementia.  But recent studies from 2020 dispute this association between benzodiazepines and dementia risk. Newer studies in 2020 do not support an association with dementia. Disc ways to taper if needed.  Call 2 weeks because of the urgency of her medical situation and the need to improve p.o. intake to report weight.  In the meantime she was given the names of eating disorder specialists in the area and it was suggested she pursue therapy on an outpatient basis as soon as possible  Lynder Parents, MD, DFAPA   Please see  After Visit Summary for patient specific instructions.  Future Appointments  Date Time Provider [place]  04/20/2021  8:45 AM [facility] PHLEBOTOMIST [facility] None  04/25/2021 10:00 AM [facility] PHLEBOTOMIST [facility] None  04/25/2021 10:40 AM Ladell Pier, MD [facility] None  05/04/2021  8:45 AM [facility] PHLEBOTOMIST [facility] None    No orders of the defined types were placed in this encounter.     -------------------------------

## 2021-04-19 ENCOUNTER — Other Ambulatory Visit: Admit: 2021-04-19 | Discharge: 2021-04-20 | Payer: PRIVATE HEALTH INSURANCE

## 2021-04-19 ENCOUNTER — Ambulatory Visit
Admit: 2021-04-19 | Discharge: 2021-04-20 | Payer: PRIVATE HEALTH INSURANCE | Attending: Primary Care | Primary: Primary Care

## 2021-04-19 DIAGNOSIS — R630 Anorexia: Principal | ICD-10-CM

## 2021-04-19 DIAGNOSIS — Z23 Encounter for immunization: Principal | ICD-10-CM

## 2021-04-19 DIAGNOSIS — C9111 Chronic lymphocytic leukemia of B-cell type in remission: Principal | ICD-10-CM

## 2021-04-19 DIAGNOSIS — N179 Acute kidney failure, unspecified: Principal | ICD-10-CM

## 2021-04-19 DIAGNOSIS — D849 Immunodeficiency, unspecified: Principal | ICD-10-CM

## 2021-04-19 DIAGNOSIS — Z9484 Stem cells transplant status: Principal | ICD-10-CM

## 2021-04-19 MED ORDER — TACROLIMUS 1 MG CAPSULE, IMMEDIATE-RELEASE
ORAL_CAPSULE | Freq: Every day | ORAL | 2 refills | 60 days | Status: CP
Start: 2021-04-19 — End: ?

## 2021-04-20 ENCOUNTER — Inpatient Hospital Stay: Payer: BC Managed Care – PPO

## 2021-04-20 ENCOUNTER — Other Ambulatory Visit: Payer: BC Managed Care – PPO

## 2021-04-25 ENCOUNTER — Inpatient Hospital Stay: Payer: BC Managed Care – PPO

## 2021-04-25 ENCOUNTER — Inpatient Hospital Stay: Payer: BC Managed Care – PPO | Admitting: Oncology

## 2021-04-27 ENCOUNTER — Other Ambulatory Visit: Admit: 2021-04-27 | Discharge: 2021-04-28 | Payer: PRIVATE HEALTH INSURANCE

## 2021-04-27 ENCOUNTER — Ambulatory Visit: Admit: 2021-04-27 | Discharge: 2021-04-28 | Payer: PRIVATE HEALTH INSURANCE

## 2021-04-27 ENCOUNTER — Other Ambulatory Visit: Payer: BC Managed Care – PPO

## 2021-04-27 DIAGNOSIS — Z9484 Stem cells transplant status: Principal | ICD-10-CM

## 2021-04-29 ENCOUNTER — Telehealth
Admit: 2021-04-29 | Discharge: 2021-04-30 | Payer: PRIVATE HEALTH INSURANCE | Attending: Infectious Disease | Primary: Infectious Disease

## 2021-04-29 DIAGNOSIS — B259 Cytomegaloviral disease, unspecified: Principal | ICD-10-CM

## 2021-04-29 DIAGNOSIS — D801 Nonfamilial hypogammaglobulinemia: Principal | ICD-10-CM

## 2021-04-29 MED ORDER — VALGANCICLOVIR 50 MG/ML ORAL SOLUTION
Freq: Every day | ORAL | 11 refills | 29.00000 days | Status: CP
Start: 2021-04-29 — End: 2022-04-24

## 2021-05-03 DIAGNOSIS — B259 Cytomegaloviral disease, unspecified: Principal | ICD-10-CM

## 2021-05-03 DIAGNOSIS — Z9484 Stem cells transplant status: Principal | ICD-10-CM

## 2021-05-03 MED ORDER — VALGANCICLOVIR 50 MG/ML ORAL SOLUTION
Freq: Every day | ORAL | 11 refills | 30 days | Status: CP
Start: 2021-05-03 — End: 2022-04-28
  Filled 2021-05-04: qty 176, 29d supply, fill #0

## 2021-05-04 ENCOUNTER — Ambulatory Visit: Admit: 2021-05-04 | Discharge: 2021-05-05 | Payer: PRIVATE HEALTH INSURANCE

## 2021-05-04 ENCOUNTER — Other Ambulatory Visit: Admit: 2021-05-04 | Discharge: 2021-05-05 | Payer: PRIVATE HEALTH INSURANCE

## 2021-05-04 ENCOUNTER — Other Ambulatory Visit: Payer: BC Managed Care – PPO

## 2021-05-04 ENCOUNTER — Inpatient Hospital Stay: Payer: BC Managed Care – PPO

## 2021-05-04 ENCOUNTER — Inpatient Hospital Stay: Payer: BC Managed Care – PPO | Admitting: Oncology

## 2021-05-04 DIAGNOSIS — C9110 Chronic lymphocytic leukemia of B-cell type not having achieved remission: Principal | ICD-10-CM

## 2021-05-04 DIAGNOSIS — E46 Unspecified protein-calorie malnutrition: Principal | ICD-10-CM

## 2021-05-04 DIAGNOSIS — B259 Cytomegaloviral disease, unspecified: Principal | ICD-10-CM

## 2021-05-04 DIAGNOSIS — Z9484 Stem cells transplant status: Principal | ICD-10-CM

## 2021-05-04 MED ORDER — HYDROXYZINE HCL 25 MG TABLET
ORAL_TABLET | ORAL | 0 refills | 10 days | Status: CP | PRN
Start: 2021-05-04 — End: ?

## 2021-05-05 ENCOUNTER — Institutional Professional Consult (permissible substitution)
Admit: 2021-05-05 | Discharge: 2021-05-06 | Payer: PRIVATE HEALTH INSURANCE | Attending: Registered" | Primary: Registered"

## 2021-05-05 DIAGNOSIS — Z9484 Stem cells transplant status: Principal | ICD-10-CM

## 2021-05-05 DIAGNOSIS — B258 Other cytomegaloviral diseases: Principal | ICD-10-CM

## 2021-05-05 MED ORDER — VALACYCLOVIR ORAL SUSP 50MG/ML
Freq: Every day | ORAL | 11 refills | 30 days | Status: CP
Start: 2021-05-05 — End: ?
  Filled 2021-05-09: qty 200, 20d supply, fill #0

## 2021-05-06 ENCOUNTER — Telehealth: Payer: Self-pay | Admitting: Psychiatry

## 2021-05-06 NOTE — Telephone Encounter (Signed)
I am sorry I thought I had already given her the names of the therapists. Altha Harm PhD [place] [facility]  They should be easy to find with internet search

## 2021-05-06 NOTE — Telephone Encounter (Signed)
Pt LVM stating that she thought Dr. Clovis Pu was reaching out to a female and a female therapist he knew for eating disorders.  She would like to know if he wasn't able to reach them, would he mind providing their name and numbers to her.

## 2021-05-06 NOTE — Telephone Encounter (Signed)
Please review

## 2021-05-08 ENCOUNTER — Other Ambulatory Visit: Payer: Self-pay | Admitting: Psychiatry

## 2021-05-08 DIAGNOSIS — F3342 Major depressive disorder, recurrent, in full remission: Secondary | ICD-10-CM

## 2021-05-09 ENCOUNTER — Other Ambulatory Visit: Payer: Self-pay | Admitting: Psychiatry

## 2021-05-09 ENCOUNTER — Inpatient Hospital Stay: Payer: BC Managed Care – PPO | Attending: Oncology

## 2021-05-09 ENCOUNTER — Inpatient Hospital Stay: Payer: BC Managed Care – PPO | Admitting: Nurse Practitioner

## 2021-05-09 NOTE — Telephone Encounter (Signed)
Pt informed

## 2021-05-11 ENCOUNTER — Ambulatory Visit: Admit: 2021-05-11 | Discharge: 2021-05-12 | Payer: PRIVATE HEALTH INSURANCE

## 2021-05-11 ENCOUNTER — Other Ambulatory Visit: Admit: 2021-05-11 | Discharge: 2021-05-12 | Payer: PRIVATE HEALTH INSURANCE

## 2021-05-11 DIAGNOSIS — D801 Nonfamilial hypogammaglobulinemia: Principal | ICD-10-CM

## 2021-05-11 DIAGNOSIS — C9110 Chronic lymphocytic leukemia of B-cell type not having achieved remission: Principal | ICD-10-CM

## 2021-05-11 DIAGNOSIS — Z9484 Stem cells transplant status: Principal | ICD-10-CM

## 2021-05-11 DIAGNOSIS — D619 Aplastic anemia, unspecified: Principal | ICD-10-CM

## 2021-05-11 DIAGNOSIS — B259 Cytomegaloviral disease, unspecified: Principal | ICD-10-CM

## 2021-05-11 DIAGNOSIS — D849 Immunodeficiency, unspecified: Principal | ICD-10-CM

## 2021-05-12 ENCOUNTER — Institutional Professional Consult (permissible substitution)
Admit: 2021-05-12 | Discharge: 2021-05-13 | Payer: PRIVATE HEALTH INSURANCE | Attending: Registered" | Primary: Registered"

## 2021-05-14 DIAGNOSIS — D702 Other drug-induced agranulocytosis: Principal | ICD-10-CM

## 2021-05-14 MED ORDER — ONDANSETRON HCL 8 MG TABLET
ORAL_TABLET | 2 refills | 0 days
Start: 2021-05-14 — End: ?

## 2021-05-16 MED ORDER — ONDANSETRON HCL 8 MG TABLET
ORAL_TABLET | 2 refills | 0 days | Status: CP
Start: 2021-05-16 — End: ?

## 2021-05-17 ENCOUNTER — Other Ambulatory Visit: Admit: 2021-05-17 | Discharge: 2021-05-17 | Payer: PRIVATE HEALTH INSURANCE

## 2021-05-17 ENCOUNTER — Ambulatory Visit: Admit: 2021-05-17 | Discharge: 2021-05-17 | Payer: PRIVATE HEALTH INSURANCE

## 2021-05-17 ENCOUNTER — Ambulatory Visit
Admit: 2021-05-17 | Discharge: 2021-05-17 | Payer: PRIVATE HEALTH INSURANCE | Attending: Critical Care Medicine | Primary: Critical Care Medicine

## 2021-05-17 DIAGNOSIS — C9110 Chronic lymphocytic leukemia of B-cell type not having achieved remission: Principal | ICD-10-CM

## 2021-05-17 DIAGNOSIS — Z9484 Stem cells transplant status: Principal | ICD-10-CM

## 2021-05-17 DIAGNOSIS — D702 Other drug-induced agranulocytosis: Principal | ICD-10-CM

## 2021-05-17 MED ORDER — ONDANSETRON HCL 8 MG TABLET
ORAL_TABLET | Freq: Two times a day (BID) | ORAL | 2 refills | 45 days | Status: CP | PRN
Start: 2021-05-17 — End: ?

## 2021-05-17 MED ORDER — TACROLIMUS 1 MG CAPSULE, IMMEDIATE-RELEASE
ORAL_CAPSULE | Freq: Every day | ORAL | 2 refills | 60 days | Status: CP
Start: 2021-05-17 — End: ?

## 2021-05-20 ENCOUNTER — Telehealth
Admit: 2021-05-20 | Discharge: 2021-05-21 | Payer: PRIVATE HEALTH INSURANCE | Attending: Infectious Disease | Primary: Infectious Disease

## 2021-05-23 DIAGNOSIS — B258 Other cytomegaloviral diseases: Principal | ICD-10-CM

## 2021-05-23 DIAGNOSIS — Z9484 Stem cells transplant status: Principal | ICD-10-CM

## 2021-05-23 MED ORDER — MARIBAVIR 200 MG TABLET
ORAL_TABLET | Freq: Two times a day (BID) | ORAL | 3 refills | 30.00000 days | Status: CP
Start: 2021-05-23 — End: ?

## 2021-05-25 ENCOUNTER — Ambulatory Visit: Admit: 2021-05-25 | Discharge: 2021-05-26 | Payer: PRIVATE HEALTH INSURANCE

## 2021-05-25 ENCOUNTER — Other Ambulatory Visit: Admit: 2021-05-25 | Discharge: 2021-05-26 | Payer: PRIVATE HEALTH INSURANCE

## 2021-05-25 ENCOUNTER — Ambulatory Visit
Admit: 2021-05-25 | Discharge: 2021-05-26 | Payer: PRIVATE HEALTH INSURANCE | Attending: Critical Care Medicine | Primary: Critical Care Medicine

## 2021-05-25 DIAGNOSIS — Z9484 Stem cells transplant status: Principal | ICD-10-CM

## 2021-05-25 DIAGNOSIS — C9110 Chronic lymphocytic leukemia of B-cell type not having achieved remission: Principal | ICD-10-CM

## 2021-05-25 DIAGNOSIS — D619 Aplastic anemia, unspecified: Principal | ICD-10-CM

## 2021-05-25 DIAGNOSIS — D849 Immunodeficiency, unspecified: Principal | ICD-10-CM

## 2021-05-25 MED ORDER — TACROLIMUS 0.5 MG CAPSULE, IMMEDIATE-RELEASE
ORAL_CAPSULE | Freq: Every day | ORAL | 1 refills | 45 days | Status: CP
Start: 2021-05-25 — End: ?

## 2021-05-30 MED FILL — VALACYCLOVIR 500 MG TABLET, ORA-SWEET SF ORAL LIQUID, ORA-PLUS ORAL SUSPENSION: ORAL | 20 days supply | Qty: 200 | Fill #1

## 2021-06-02 ENCOUNTER — Ambulatory Visit: Admit: 2021-06-02 | Discharge: 2021-06-03 | Payer: PRIVATE HEALTH INSURANCE

## 2021-06-02 ENCOUNTER — Other Ambulatory Visit: Admit: 2021-06-02 | Discharge: 2021-06-03 | Payer: PRIVATE HEALTH INSURANCE

## 2021-06-02 ENCOUNTER — Ambulatory Visit
Admit: 2021-06-02 | Discharge: 2021-06-03 | Payer: PRIVATE HEALTH INSURANCE | Attending: Critical Care Medicine | Primary: Critical Care Medicine

## 2021-06-02 ENCOUNTER — Encounter: Admit: 2021-06-02 | Discharge: 2021-06-03 | Payer: PRIVATE HEALTH INSURANCE

## 2021-06-02 ENCOUNTER — Telehealth: Payer: Self-pay

## 2021-06-02 DIAGNOSIS — E875 Hyperkalemia: Principal | ICD-10-CM

## 2021-06-02 DIAGNOSIS — C9111 Chronic lymphocytic leukemia of B-cell type in remission: Principal | ICD-10-CM

## 2021-06-02 NOTE — Telephone Encounter (Signed)
Prior Authorization submitted and approved for TRINTELLIX 70 MG effective 05/19/2021-05/18/2022 with [facility]

## 2021-06-04 MED ORDER — FLUCONAZOLE 200 MG TABLET
ORAL_TABLET | Freq: Every day | ORAL | 0 refills | 14 days | Status: CP
Start: 2021-06-04 — End: ?

## 2021-06-04 MED ORDER — LEVOFLOXACIN 250 MG TABLET
ORAL_TABLET | Freq: Every day | ORAL | 0 refills | 14 days | Status: CP
Start: 2021-06-04 — End: ?

## 2021-06-05 ENCOUNTER — Ambulatory Visit: Admit: 2021-06-05 | Discharge: 2021-06-06 | Payer: PRIVATE HEALTH INSURANCE

## 2021-06-05 ENCOUNTER — Encounter
Admit: 2021-06-05 | Discharge: 2021-06-06 | Payer: PRIVATE HEALTH INSURANCE | Attending: Adult Health | Primary: Adult Health

## 2021-06-06 DIAGNOSIS — Z9484 Stem cells transplant status: Principal | ICD-10-CM

## 2021-06-06 MED ORDER — VALACYCLOVIR 500 MG TABLET
ORAL_TABLET | Freq: Every day | ORAL | 0 refills | 30 days | Status: CP
Start: 2021-06-06 — End: 2021-07-06
  Filled 2021-06-08: qty 30, 30d supply, fill #0

## 2021-06-06 MED ORDER — VALACYCLOVIR ORAL SUSP 50MG/ML
Freq: Every day | ORAL | 11 refills | 30.00000 days | Status: CN
Start: 2021-06-06 — End: ?

## 2021-06-08 ENCOUNTER — Other Ambulatory Visit: Admit: 2021-06-08 | Discharge: 2021-06-08 | Payer: PRIVATE HEALTH INSURANCE

## 2021-06-08 ENCOUNTER — Ambulatory Visit
Admit: 2021-06-08 | Discharge: 2021-06-08 | Payer: PRIVATE HEALTH INSURANCE | Attending: Pharmacist Clinician (PhC)/ Clinical Pharmacy Specialist | Primary: Pharmacist Clinician (PhC)/ Clinical Pharmacy Specialist

## 2021-06-08 ENCOUNTER — Ambulatory Visit: Admit: 2021-06-08 | Discharge: 2021-06-08 | Payer: PRIVATE HEALTH INSURANCE

## 2021-06-08 ENCOUNTER — Ambulatory Visit
Admit: 2021-06-08 | Discharge: 2021-06-08 | Payer: PRIVATE HEALTH INSURANCE | Attending: Registered" | Primary: Registered"

## 2021-06-08 DIAGNOSIS — Z9484 Stem cells transplant status: Principal | ICD-10-CM

## 2021-06-08 DIAGNOSIS — N179 Acute kidney failure, unspecified: Principal | ICD-10-CM

## 2021-06-08 DIAGNOSIS — E875 Hyperkalemia: Principal | ICD-10-CM

## 2021-06-08 DIAGNOSIS — C9111 Chronic lymphocytic leukemia of B-cell type in remission: Principal | ICD-10-CM

## 2021-06-08 DIAGNOSIS — B259 Cytomegaloviral disease, unspecified: Principal | ICD-10-CM

## 2021-06-08 DIAGNOSIS — K148 Other diseases of tongue: Principal | ICD-10-CM

## 2021-06-08 DIAGNOSIS — D61818 Other pancytopenia: Principal | ICD-10-CM

## 2021-06-08 DIAGNOSIS — E639 Nutritional deficiency, unspecified: Principal | ICD-10-CM

## 2021-06-08 MED ORDER — LEVOTHYROXINE 100 MCG TABLET
ORAL_TABLET | Freq: Every day | GASTROSTOMY | 5 refills | 30 days | Status: CP
Start: 2021-06-08 — End: ?

## 2021-06-08 MED ORDER — FLUCONAZOLE 200 MG TABLET
ORAL_TABLET | Freq: Every day | ORAL | 0 refills | 30 days | Status: CP
Start: 2021-06-08 — End: ?

## 2021-06-08 MED ORDER — LEVOFLOXACIN 250 MG TABLET
ORAL_TABLET | Freq: Every day | ORAL | 3 refills | 30.00000 days | Status: CP
Start: 2021-06-08 — End: ?

## 2021-06-09 DIAGNOSIS — Z9484 Stem cells transplant status: Principal | ICD-10-CM

## 2021-06-10 ENCOUNTER — Ambulatory Visit
Admit: 2021-06-10 | Discharge: 2021-06-11 | Payer: PRIVATE HEALTH INSURANCE | Attending: Adult Health | Primary: Adult Health

## 2021-06-10 ENCOUNTER — Ambulatory Visit
Admit: 2021-06-10 | Discharge: 2021-06-11 | Payer: PRIVATE HEALTH INSURANCE | Attending: Student in an Organized Health Care Education/Training Program | Primary: Student in an Organized Health Care Education/Training Program

## 2021-06-10 ENCOUNTER — Other Ambulatory Visit: Admit: 2021-06-10 | Discharge: 2021-06-11 | Payer: PRIVATE HEALTH INSURANCE

## 2021-06-10 DIAGNOSIS — K1379 Other lesions of oral mucosa: Principal | ICD-10-CM

## 2021-06-10 DIAGNOSIS — Z9484 Stem cells transplant status: Principal | ICD-10-CM

## 2021-06-10 MED ORDER — ALUMINUM-MAG HYDROXIDE-SIMETHICONE 200 MG-200 MG-20 MG/5 ML ORAL SUSP
0 refills | 0 days | Status: CP
Start: 2021-06-10 — End: ?
  Filled 2021-06-10: qty 355, 15d supply, fill #0

## 2021-06-10 MED ORDER — AMOXICILLIN 875 MG-POTASSIUM CLAVULANATE 125 MG TABLET
ORAL_TABLET | Freq: Two times a day (BID) | ORAL | 0 refills | 14 days | Status: CP
Start: 2021-06-10 — End: 2021-06-24
  Filled 2021-06-10: qty 28, 14d supply, fill #0

## 2021-06-10 MED ORDER — MAGIC MOUTHWASH ORAL SUSPENSION
Freq: Four times a day (QID) | ORAL | 0 refills | 6 days | Status: CP
Start: 2021-06-10 — End: 2021-06-10

## 2021-06-10 MED ORDER — LIDOCAINE HCL 2 % MUCOSAL SOLUTION
0 refills | 0 days | Status: CP
Start: 2021-06-10 — End: ?
  Filled 2021-06-10: qty 100, 5d supply, fill #0

## 2021-06-10 MED ORDER — DIPHENHYDRAMINE 12.5 MG/5 ML ORAL LIQUID
0 refills | 0 days | Status: CP
Start: 2021-06-10 — End: ?
  Filled 2021-06-10: qty 118, 6d supply, fill #0

## 2021-06-12 ENCOUNTER — Ambulatory Visit
Admit: 2021-06-12 | Discharge: 2021-06-15 | Disposition: A | Discharge status: Home / Self Care | Payer: PRIVATE HEALTH INSURANCE

## 2021-06-12 ENCOUNTER — Encounter: Admit: 2021-06-12 | Discharge: 2021-06-15 | Payer: PRIVATE HEALTH INSURANCE

## 2021-06-12 ENCOUNTER — Ambulatory Visit: Admit: 2021-06-12 | Discharge: 2021-06-15 | Payer: PRIVATE HEALTH INSURANCE

## 2021-06-12 ENCOUNTER — Encounter
Admit: 2021-06-12 | Discharge: 2021-06-15 | Disposition: A | Discharge status: Home / Self Care | Payer: PRIVATE HEALTH INSURANCE

## 2021-06-14 MED ORDER — MIRTAZAPINE 15 MG TABLET
ORAL_TABLET | Freq: Every evening | ORAL | 0 refills | 7 days
Start: 2021-06-14 — End: 2021-06-21

## 2021-06-15 MED ORDER — CHLORHEXIDINE GLUCONATE 0.12 % MOUTHWASH
Freq: Two times a day (BID) | OROMUCOSAL | 0 refills | 47 days | Status: CP
Start: 2021-06-15 — End: 2021-08-01
  Filled 2021-06-15: qty 946, 47d supply, fill #0

## 2021-06-15 MED ORDER — MIRTAZAPINE 15 MG TABLET
ORAL_TABLET | Freq: Every evening | ORAL | 0 refills | 7 days | Status: CP
Start: 2021-06-15 — End: 2021-06-22
  Filled 2021-06-15 (×2): qty 7, 7d supply, fill #0

## 2021-06-15 MED ORDER — CLINDAMYCIN HCL 150 MG CAPSULE
ORAL_CAPSULE | Freq: Three times a day (TID) | ORAL | 0 refills | 7 days | Status: CP
Start: 2021-06-15 — End: 2021-06-22
  Filled 2021-06-15: qty 63, 7d supply, fill #0

## 2021-06-19 DIAGNOSIS — Z9484 Stem cells transplant status: Principal | ICD-10-CM

## 2021-06-19 DIAGNOSIS — B259 Cytomegaloviral disease, unspecified: Principal | ICD-10-CM

## 2021-06-19 DIAGNOSIS — Z8619 Personal history of other infectious and parasitic diseases: Principal | ICD-10-CM

## 2021-06-19 MED ORDER — LETERMOVIR 480 MG TABLET
ORAL_TABLET | Freq: Every day | ORAL | 2 refills | 30 days | Status: CP
Start: 2021-06-19 — End: ?

## 2021-06-20 ENCOUNTER — Ambulatory Visit
Admit: 2021-06-20 | Discharge: 2021-06-21 | Payer: PRIVATE HEALTH INSURANCE | Attending: Primary Care | Primary: Primary Care

## 2021-06-20 ENCOUNTER — Encounter
Admit: 2021-06-20 | Discharge: 2021-06-21 | Payer: PRIVATE HEALTH INSURANCE | Attending: Primary Care | Primary: Primary Care

## 2021-06-20 ENCOUNTER — Ambulatory Visit: Admit: 2021-06-20 | Discharge: 2021-06-21 | Payer: PRIVATE HEALTH INSURANCE

## 2021-06-20 ENCOUNTER — Other Ambulatory Visit: Admit: 2021-06-20 | Discharge: 2021-06-21 | Payer: PRIVATE HEALTH INSURANCE

## 2021-06-20 DIAGNOSIS — Z8619 Personal history of other infectious and parasitic diseases: Principal | ICD-10-CM

## 2021-06-20 DIAGNOSIS — Z9484 Stem cells transplant status: Principal | ICD-10-CM

## 2021-06-20 DIAGNOSIS — E875 Hyperkalemia: Principal | ICD-10-CM

## 2021-06-20 DIAGNOSIS — D709 Neutropenia, unspecified: Principal | ICD-10-CM

## 2021-06-20 DIAGNOSIS — N189 Chronic kidney disease, unspecified: Principal | ICD-10-CM

## 2021-06-20 DIAGNOSIS — C9111 Chronic lymphocytic leukemia of B-cell type in remission: Principal | ICD-10-CM

## 2021-06-20 MED ORDER — MIRTAZAPINE 7.5 MG TABLET
ORAL_TABLET | Freq: Every evening | ORAL | 0 refills | 7.00000 days | Status: CP
Start: 2021-06-20 — End: 2021-06-27

## 2021-06-21 DIAGNOSIS — Z9484 Stem cells transplant status: Principal | ICD-10-CM

## 2021-06-21 DIAGNOSIS — D702 Other drug-induced agranulocytosis: Principal | ICD-10-CM

## 2021-06-21 MED ORDER — POSACONAZOLE 100 MG TABLET,DELAYED RELEASE
ORAL_TABLET | 0 refills | 0 days | Status: CP
Start: 2021-06-21 — End: ?

## 2021-06-21 MED FILL — VALACYCLOVIR 500 MG TABLET, ORA-SWEET SF ORAL LIQUID, ORA-PLUS ORAL SUSPENSION: ORAL | 20 days supply | Qty: 200 | Fill #2

## 2021-06-22 DIAGNOSIS — Z9484 Stem cells transplant status: Principal | ICD-10-CM

## 2021-06-28 ENCOUNTER — Other Ambulatory Visit: Admit: 2021-06-28 | Discharge: 2021-06-28 | Payer: PRIVATE HEALTH INSURANCE

## 2021-06-28 ENCOUNTER — Ambulatory Visit: Admit: 2021-06-28 | Discharge: 2021-06-28 | Payer: PRIVATE HEALTH INSURANCE

## 2021-06-28 ENCOUNTER — Ambulatory Visit: Admit: 2021-06-28 | Discharge: 2021-07-01 | Payer: PRIVATE HEALTH INSURANCE

## 2021-06-28 ENCOUNTER — Encounter
Admit: 2021-06-28 | Discharge: 2021-07-01 | Disposition: A | Discharge status: Home / Self Care | Payer: PRIVATE HEALTH INSURANCE | Source: Ambulatory Visit

## 2021-06-28 ENCOUNTER — Ambulatory Visit
Admit: 2021-06-28 | Discharge: 2021-07-01 | Disposition: A | Discharge status: Home / Self Care | Payer: PRIVATE HEALTH INSURANCE | Source: Ambulatory Visit

## 2021-06-28 DIAGNOSIS — D84822 Immunocompromised state associated with stem cell transplant (CMS-HCC): Principal | ICD-10-CM

## 2021-06-28 DIAGNOSIS — D703 Neutropenia due to infection: Principal | ICD-10-CM

## 2021-06-28 DIAGNOSIS — Z9484 Stem cells transplant status: Principal | ICD-10-CM

## 2021-06-28 DIAGNOSIS — C9110 Chronic lymphocytic leukemia of B-cell type not having achieved remission: Principal | ICD-10-CM

## 2021-06-28 DIAGNOSIS — D61818 Other pancytopenia: Principal | ICD-10-CM

## 2021-06-29 MED ORDER — PATIROMER CALCIUM SORBITEX 8.4 GRAM ORAL POWDER PACKET
PACK | Freq: Every day | ORAL | 0 refills | 30 days
Start: 2021-06-29 — End: 2021-06-29

## 2021-07-01 DIAGNOSIS — D619 Aplastic anemia, unspecified: Principal | ICD-10-CM

## 2021-07-01 DIAGNOSIS — C9111 Chronic lymphocytic leukemia of B-cell type in remission: Principal | ICD-10-CM

## 2021-07-01 MED ORDER — POSACONAZOLE 100 MG TABLET,DELAYED RELEASE
ORAL_TABLET | ORAL | 3 refills | 30 days | Status: CP
Start: 2021-07-01 — End: ?
  Filled 2021-07-01: qty 90, 30d supply, fill #0

## 2021-07-02 MED ORDER — FLUDROCORTISONE 0.1 MG TABLET
ORAL_TABLET | Freq: Every day | ORAL | 5 refills | 30 days | Status: CP
Start: 2021-07-02 — End: ?
  Filled 2021-07-01: qty 30, 30d supply, fill #0

## 2021-07-04 DIAGNOSIS — Z9484 Stem cells transplant status: Principal | ICD-10-CM

## 2021-07-04 LAB — RENIN ASSAY: RENIN PLASMA: <0.6 ng/mL/h

## 2021-07-04 NOTE — Unmapped (Signed)
Specialty Medication(s): posaconazole     Susan Kane has been dis-enrolled from the [facility] specialty pharmacy services due to a pharmacy change. The patient is now filling at [facility].    Additional information provided to the patient: please call [facility] to refill valacyclovir suspension (Special forms request)    Breck Coons [facility] Specialty Pharmacist

## 2021-07-04 NOTE — Unmapped (Unsigned)
Pharmacy Post-Discharge Assessment:    Susan Kane is a 60 y.o. 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 She has had a complicated post transplant course with recurrent CMV, poor nutrition requiring G-tube, renal injury, and profound/persistent neutropenia. She was most recently admitted 12/6 with concerns with G-tube site, abdominal pain, and hyperkalemia. She received 1 day of meropenem while admitted, but the abdominal CT was unremarkable at the G-tube site and it did not appear infected so she was transitioned back to prophylaxis. The abdominal CT did note lower lobe pulmonary nodules. A follow up dedicated chest CT was done on 12/7 which noted 9-10 mm nodules in clusters consistent with endobronchial spread of infection. Her posaconazole had been approved from prior outpatient efforts and was started on 12/8. ICID and Pulm were consulted. ***Further workup pending (see ICID note, galactomannan and fungitell pending from 12/7) at time of discharge and Pulm did not recommend bronch at this point. Her hyperkalemia gradually improved. 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 The patient was discharged on 12/9 and arrives in clinic today for her outpatient follow up visit.    Fungitell negative < 31 on 12/7; galactomannan, renin,aldost still pending    Interval History:    Oncology History Overview Note   CLL:    Summary as per Dr. Kalman Drape most recent note with minor edits/additions from my review of records    1.??CLL-??diagnosed in August 2010, flow cytometry consistent with CLL  ?? Enlarged left??inguinal lymph node January 2019,??small??neck/axillary nodes and palpable splenomegaly 09/11/2017  ?? CTs??on 09/17/2017-3 cm necrotic appearing lymph node in the left inguinal region, borderline enlarged pelvic/retroperitoneal, chest, and axillary nodes. ??Mild splenomegaly.  ?? Ultrasound-guided biopsy of the left inguinal lymph node 09/18/2017, slightly purulent fluid aspirated, core biopsy is consistent with an atypical lymphoid proliferation-extensive necrosis with surrounding epithelioid histiocytes, limited intact lymphoid tissue involved with CLL  ?? Incisional biopsy of a necrotic/purulent left inguinal lymph node on 10/01/2017-extensive necrosis with granulomatous inflammation, small amount of viable lymphoid tissue involved with CLL, AFB and fungal stains negative  ?? Peripheral blood FISH analysis 02/05/2018- +deletion 13q14, no evidence of p53 (17p13) deletion, no evidence of 11q22??deletion  - normal karyotype (46,XX)  ?? Bone marrow biopsy 02/26/2018-hypercellular marrow with extensive involvement by CLL, lymphocytes represent??85% of all cells  ?? Ibrutinib initiated 04/03/2018  ?? Ibrutinib placed on hold 04/11/2018 due to onset of arthralgias  ?? Ibrutinib resumed 04/16/2018, discontinued 04/25/2018 secondary to severe arthralgias/arthritis  ?? Ibrutinib resumed at a dose of 140 mg daily 05/03/2018  ?? Ibrutinib dose adjusted to 140 mg alternating with 280 mg 06/25/2018  ?? 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 ??Augmentin prescribed.  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    Baseline BM bx reviewed at [facility]  Final Diagnosis   (Outside Case #:  [id number], dated 02/26/2018)  Bone marrow, aspiration and biopsy  -  Hypercellular bone marrow (80%) with extensive involvement by chronic lymphocytic leukemia (87% lymphocytes by manual aspirate differential)  (See Comment)  -  By outside report, cytogenetic results are normal Karyotype: 24, XX and FISH with 13q del but no  11q or 17p del    Repeat BM bx: (done after being off ibrutinib x1 month)  80% involvement by CLL    08/13/18: Presents to [facility] to discuss plan of care     Chronic lymphocytic leukemia (CLL), B-cell (CMS-HCC)   07/12/2018 Initial Diagnosis    Chronic lymphocytic leukemia (CLL), B-cell (CMS-HCC)     08/26/2018 -  Chemotherapy    Acalabrutinib started (approximate date)     11/14/2018 -  Chemotherapy    Acalabrutinib discontinued due to progressive bone marrow failure     12/06/2018 -  Chemotherapy    Ritux x1 given due to CLL still making up majority of BM cellularity (though in the setting of near aplasia of rest of TLH)     03/19/2019 -  Chemotherapy    Equine ATG / CsA for aplastic anemia (suspect 2/t acalabrutinib)     Chronic lymphocytic leukemia of B-cell type not having achieved remission (CMS-HCC)   01/21/2019 Initial Diagnosis    Chronic lymphocytic leukemia of B-cell type not having achieved remission (CMS-HCC)     01/01/2020 - 01/26/2020 Chemotherapy    BMT IP BENDAMUSTINE / FLUDARABINE / RITUXIMAB (OP RITUXIMAB Days -13 & -6) (MUD)  bendamustine 130 mg/m2 IV on days -5, -4, -3   fludarabine 30 mg/m2 IV on days -5, -4, -3   riTUXimab 375 mg/m2 IV on day -13 as a OP treatment day, then 1,000 mg/m2 on days -6 as a OP treatment day, +1, +8   tacrolimus, 0.045 mg/kg PO BID on days -3 and -2 then  0.03 mg/kg PO BID starting on day -1   methotrexate 5 mg/m2 IV on days +1, +3, +6, +11   tbo-filgrastim 300 mcg (<= 75 kg) / 480 mcg (> 75 kg) q24h starting on day +7      08/13/2020 -  Chemotherapy    BMT IP NMA FLUDARABINE / CYCLOPHOSPHAMIDE / TBI / rATG (MMUD/MMRD)  Fludarabine 25 mg/m2 IV Days -6 to -2  Cyclophosphamide 50 mg/kg IV Day -6  [place] 50 mg/kg IV Day -6  TBI 200 cGy Day -1   rATG 0.5 mg/kg IV Day -3, 1.5 mg/kg IV Day -2, 2.5 mg/kg IV Day -1         Current Outpatient Medications   Medication Sig Dispense Refill   ??? ALPRAZolam (XANAX) 0.25 MG tablet Take 1 tablet (0.25 mg total) by mouth two (2) times a day as needed for anxiety. 30 tablet 0   ??? chlorhexidine (PERIDEX) 0.12 % solution Rinse and spit out 10 mL by Mouth route two (2) times a day. 946 mL 0   ??? fludrocortisone (FLORINEF) 0.1 mg tablet Take 1 tablet (0.1 mg total) by mouth daily. 30 tablet 5   ??? letermovir (PREVYMIS) 480 mg tablet Take 1 tablet (480 mg total) by mouth daily. 28 tablet 2   ??? levoFLOXacin (LEVAQUIN) 250 MG tablet Take 1 tablet (250 mg total) by mouth daily. 30 tablet 3   ??? levothyroxine (SYNTHROID) 100 MCG tablet 1 tablet (100 mcg total) by G-tube route daily. 30 tablet 5   ??? lithium (LITHOBID) 300 MG ER tablet Take 1 tablet (300 mg total) by mouth at bedtime. 30 tablet 5   ??? ondansetron (ZOFRAN) 8 MG tablet Take 1 tablet (8 mg total) by mouth every twelve (12) hours as needed for nausea. 90 tablet 2   ??? posaconazole (NOXAFIL) 100 mg delayed released tablet Take 3 tablets (300 mg total) by mouth daily. 90 tablet 3   ???  TRINTELLIX 10 mg tablet Take 1 tablet (10 mg total) by mouth at bedtime. 30 tablet 5   ??? valACYclovir (VALTREX) 50 mg/mL oral suspension Take 10 mL (500 mg total) by mouth daily. 300 mL 11     No current facility-administered medications for this visit.     Facility-Administered Medications Ordered in Other Visits   Medication Dose Route Frequency Provider Last Rate Last Admin   ??? influenza vaccine quad (FLUARIX, FLULAVAL, FLUZONE) (6 MOS & UP) 2022-23 ADS Med                Assessment/Plan:    **BMT  - Currently day +319 s/p 2nd MUD transplant with NMA Flu/Cy/TBI/rATG (07/2020) and day +537 s/p 1st MUD transplant with RIC Benda/Flu/Ritux (12/2019) initially for CLL f/b AA  - WBC: ***,  ANC ***, Hgb ***, PLT ***  - Neutropenia work-up has been unrevealing to date; drug-specific antibodies negative    GVHD Prophylaxis  - tacrolimus discontinued on admit (stopped 06/28/21 per notes)    **ID  1. Prophylaxis:  - antibacterial - continue levofloxacin 250 mg/day (renally dosed) for bacterial prophylaxis while neutropenic  - antifungal - see #2 below  - antiviral - continue valacyclovir 500 mg PO Qday for VZV ppx (renally dosed)  - PJP - continue inhaled pentamidine Q28 days; last received 11/28  - CMV - most recent CMV not detected on 12/6, repeat pending from today; remains off of maribavir; working on letermovir appeal    2. Pulmonary nodules; new, incidental finding on CT A/P on 06/29/21  - Continue posaconzaole 300 mg PO Qday; level goal > 1200 ng/mL per ICID for empiric treatment given pulmonary nodules; started on 12/8; plan to repeat chest CT in 6 weeks    **FENGI  Hyperkalemia:        - Had previously changed to lower potassium tube feed; potassium improved with multiple doses of Lokelma while admitted; down to 5 from 6.5 mmol/L on admission        - Patiromer is $0 copay per test claim; consider starting as outpatient if potassium remains elevated  - RTA workup in process***; continue fludricortisone 100 mcg/day, started on 12/8  - Continue PRN use of  zofran for nausea/vomiting    **Psych  - Continue longstanding vortioxetine 10 mg nightly and lithium ER 300 mg daily (level check with AKI and hyperkalemia on admission and was WNL at 1 mmol/L)  - May use alprazolam BID prn anxiety  - Mirtazapine discontinued on admit (stopped on 12/6 per notes)    **Endo  - Continue home levothyroxine 100 mcg/day    Pharmacy Information: Susan Kane had prescriptions filled at [facility] (phone:[phone]). At the time of discharge:  1. Posaconazole approved for $0 co-pay  2. Letermovir initially denied, will require an appeal    I provided Susan Kane with an updated MedActionPlan at this visit. I would be happy to see Susan Kane in the future as needed for further medication managment issues.    Medications prescribed or ordered upon discharge were reviewed today and reconciled with the most recent outpatient medication list.  Medication reconciliation was conducted by a clinical pharmacist    I spent *** minutes in direct patient care with this patient    Thank you,    Dallas Schimke, PharmD, CPP  BMT Clinical Pharmacist Practitioner

## 2021-07-05 ENCOUNTER — Ambulatory Visit: Admit: 2021-07-05 | Discharge: 2021-07-05 | Payer: PRIVATE HEALTH INSURANCE | Attending: Oncology | Primary: Oncology

## 2021-07-05 ENCOUNTER — Ambulatory Visit: Admit: 2021-07-05 | Discharge: 2021-07-05 | Payer: PRIVATE HEALTH INSURANCE

## 2021-07-05 ENCOUNTER — Other Ambulatory Visit: Admit: 2021-07-05 | Discharge: 2021-07-05 | Payer: PRIVATE HEALTH INSURANCE

## 2021-07-05 DIAGNOSIS — C9110 Chronic lymphocytic leukemia of B-cell type not having achieved remission: Principal | ICD-10-CM

## 2021-07-05 DIAGNOSIS — Z9484 Stem cells transplant status: Principal | ICD-10-CM

## 2021-07-05 DIAGNOSIS — Z9481 Bone marrow transplant status: Principal | ICD-10-CM

## 2021-07-05 DIAGNOSIS — C9111 Chronic lymphocytic leukemia of B-cell type in remission: Principal | ICD-10-CM

## 2021-07-05 DIAGNOSIS — D619 Aplastic anemia, unspecified: Principal | ICD-10-CM

## 2021-07-05 MED ORDER — CLINDAMYCIN HCL 300 MG CAPSULE
ORAL_CAPSULE | Freq: Three times a day (TID) | ORAL | 0 refills | 21.00000 days | Status: CP
Start: 2021-07-05 — End: 2021-07-26

## 2021-07-05 MED ORDER — TACROLIMUS 0.5 MG CAPSULE, IMMEDIATE-RELEASE
ORAL_CAPSULE | Freq: Two times a day (BID) | ORAL | 2 refills | 30 days | Status: CP
Start: 2021-07-05 — End: ?
  Filled 2021-07-10: qty 30, 30d supply, fill #0

## 2021-07-07 MED FILL — VALACYCLOVIR 500 MG TABLET, ORA-SWEET SF ORAL LIQUID, ORA-PLUS ORAL SUSPENSION: ORAL | 20 days supply | Qty: 200 | Fill #3

## 2021-07-08 ENCOUNTER — Ambulatory Visit: Admit: 2021-07-08 | Payer: PRIVATE HEALTH INSURANCE

## 2021-07-08 ENCOUNTER — Ambulatory Visit: Admit: 2021-07-08 | Discharge: 2021-07-10 | Payer: PRIVATE HEALTH INSURANCE

## 2021-07-10 DIAGNOSIS — C9111 Chronic lymphocytic leukemia of B-cell type in remission: Principal | ICD-10-CM

## 2021-07-10 DIAGNOSIS — Z7682 Awaiting organ transplant status: Principal | ICD-10-CM

## 2021-07-10 MED ORDER — VALACYCLOVIR 500 MG TABLET
ORAL_TABLET | ORAL | 11 refills | 60.00000 days | Status: CP
Start: 2021-07-10 — End: 2021-07-10
  Filled 2021-07-10: qty 30, 60d supply, fill #0

## 2021-07-10 MED ORDER — LITHIUM CARBONATE 150 MG CAPSULE
ORAL_CAPSULE | Freq: Every evening | ORAL | 1 refills | 30 days | Status: CP
Start: 2021-07-10 — End: ?
  Filled 2021-07-10: qty 30, 30d supply, fill #0

## 2021-07-11 ENCOUNTER — Telehealth: Payer: Self-pay | Admitting: Psychiatry

## 2021-07-11 MED ORDER — TACROLIMUS 0.5 MG CAPSULE, IMMEDIATE-RELEASE
ORAL_CAPSULE | Freq: Every day | ORAL | 11 refills | 30.00000 days | Status: CN
Start: 2021-07-11 — End: 2022-07-11

## 2021-07-11 NOTE — Telephone Encounter (Signed)
Pt Meagan Walsh stating that Dr. Donzetta Sprung in [place] is weaning her off Lithium and Trintellix.  (Did not advise why).  Dr is starting by cutting the Lithium in half and pt wants to know if it's ok.  She also wants to know when she can/should she start to wean off the Trintellix.  No upcoming appt scheduled.

## 2021-07-11 NOTE — Telephone Encounter (Signed)
Please review

## 2021-07-12 ENCOUNTER — Ambulatory Visit: Admit: 2021-07-12 | Discharge: 2021-07-12 | Payer: PRIVATE HEALTH INSURANCE

## 2021-07-12 ENCOUNTER — Other Ambulatory Visit: Admit: 2021-07-12 | Discharge: 2021-07-12 | Payer: PRIVATE HEALTH INSURANCE

## 2021-07-12 DIAGNOSIS — D708 Other neutropenia: Principal | ICD-10-CM

## 2021-07-12 DIAGNOSIS — Z9484 Stem cells transplant status: Principal | ICD-10-CM

## 2021-07-12 DIAGNOSIS — Z9481 Bone marrow transplant status: Principal | ICD-10-CM

## 2021-07-12 DIAGNOSIS — Z7682 Awaiting organ transplant status: Principal | ICD-10-CM

## 2021-07-12 MED ORDER — LEVOFLOXACIN 250 MG TABLET
ORAL_TABLET | ORAL | 3 refills | 60 days | Status: CP
Start: 2021-07-12 — End: ?
  Filled 2021-07-12: qty 60, 60d supply, fill #0

## 2021-07-12 MED ORDER — MAGNESIUM OXIDE-MAGNESIUM AMINO ACID CHELATE 133 MG TABLET
ORAL_TABLET | Freq: Every day | ORAL | 1 refills | 100 days | Status: CP
Start: 2021-07-12 — End: ?
  Filled 2021-07-12: qty 100, 100d supply, fill #0

## 2021-07-13 NOTE — Telephone Encounter (Signed)
Pt has called again. °

## 2021-07-13 NOTE — Telephone Encounter (Signed)
Pt LVM again today.  Dr. Donzetta Sprung is her bone marrow doctor.  She wants her to stop the Lithium altogether now.  She wants to know Dr. Casimiro Needle thoughts and how she should proceed to wean herself.  Also today she states that she has nothing for sleep and she needs something.  No upcoming appt scheduled

## 2021-07-19 ENCOUNTER — Ambulatory Visit: Admit: 2021-07-19 | Discharge: 2021-07-20 | Payer: PRIVATE HEALTH INSURANCE

## 2021-07-19 ENCOUNTER — Other Ambulatory Visit: Admit: 2021-07-19 | Discharge: 2021-07-20 | Payer: PRIVATE HEALTH INSURANCE

## 2021-07-19 ENCOUNTER — Other Ambulatory Visit: Payer: Self-pay | Admitting: Psychiatry

## 2021-07-19 DIAGNOSIS — Z9481 Bone marrow transplant status: Principal | ICD-10-CM

## 2021-07-19 DIAGNOSIS — C9111 Chronic lymphocytic leukemia of B-cell type in remission: Principal | ICD-10-CM

## 2021-07-19 DIAGNOSIS — Z9484 Stem cells transplant status: Principal | ICD-10-CM

## 2021-07-19 MED ORDER — MAGNESIUM OXIDE-MAGNESIUM AMINO ACID CHELATE 133 MG TABLET
ORAL_TABLET | Freq: Two times a day (BID) | ORAL | 1 refills | 50 days | Status: CP
Start: 2021-07-19 — End: ?

## 2021-07-19 MED ORDER — POTASSIUM CHLORIDE ER 10 MEQ CAPSULE,EXTENDED RELEASE
ORAL_CAPSULE | 0 refills | 0 days | Status: CP
Start: 2021-07-19 — End: ?

## 2021-07-20 NOTE — Telephone Encounter (Signed)
Pt left a message today again asking for a call back. She said that her bone marrow doctor wants to stop all of her meds. However she will not stop the trintellix without talking to dr. Clovis Pu. She said that she wants to know how to wean it off of it. She also wants to know can she can have something different to help with her anxiety. Please call her at  [phone]

## 2021-07-20 NOTE — Telephone Encounter (Signed)
Pt stated she has gained weight and is up to 108 lbs she just wanted you to know.The reason why her oncologist wants her to stop these meds is because she has zero neutrophils and thinks it may be coming from one of the meds,Pt will call us if she has any symptoms.

## 2021-07-20 NOTE — Telephone Encounter (Signed)
Noted.  Information is helpful.  Agree with plan.   Although it should be noted for the record that lithium tends to increase WBC rather than decrease.

## 2021-07-20 NOTE — Telephone Encounter (Signed)
Please review

## 2021-07-20 NOTE — Telephone Encounter (Signed)
Trintellix stays in the body for 2 to 3 weeks after stopping it.  So in essence it tapers itself so there is no additional taper that is absolutely necessary.  It should not cause any withdrawal symptoms by stopping it or the lithium.  Obviously stopping the Trintellix and stopping the lithium expose her to a risk of recurrent depression.  But if she and her oncologist feel that is absolutely necessary to do so then she can proceed and let me know if there is a problem.

## 2021-07-22 ENCOUNTER — Other Ambulatory Visit: Payer: Self-pay | Admitting: Psychiatry

## 2021-07-22 MED ORDER — MAGNESIUM OXIDE-MAGNESIUM AMINO ACID CHELATE 133 MG TABLET
ORAL_TABLET | Freq: Two times a day (BID) | ORAL | 1 refills | 50 days | Status: CP
Start: 2021-07-22 — End: ?
  Filled 2021-08-31: qty 100, 50d supply, fill #0

## 2021-07-23 ENCOUNTER — Other Ambulatory Visit: Payer: Self-pay | Admitting: Psychiatry

## 2021-07-26 ENCOUNTER — Ambulatory Visit
Admit: 2021-07-26 | Discharge: 2021-07-26 | Payer: PRIVATE HEALTH INSURANCE | Attending: Infectious Disease | Primary: Infectious Disease

## 2021-07-26 ENCOUNTER — Ambulatory Visit: Admit: 2021-07-26 | Discharge: 2021-07-26 | Payer: PRIVATE HEALTH INSURANCE

## 2021-07-26 ENCOUNTER — Other Ambulatory Visit: Admit: 2021-07-26 | Discharge: 2021-07-26 | Payer: PRIVATE HEALTH INSURANCE

## 2021-07-26 DIAGNOSIS — Z9481 Bone marrow transplant status: Principal | ICD-10-CM

## 2021-07-26 DIAGNOSIS — K5909 Other constipation: Principal | ICD-10-CM

## 2021-07-26 DIAGNOSIS — R918 Other nonspecific abnormal finding of lung field: Principal | ICD-10-CM

## 2021-07-26 DIAGNOSIS — C9110 Chronic lymphocytic leukemia of B-cell type not having achieved remission: Principal | ICD-10-CM

## 2021-07-26 DIAGNOSIS — F325 Major depressive disorder, single episode, in full remission: Principal | ICD-10-CM

## 2021-07-26 DIAGNOSIS — Z9484 Stem cells transplant status: Principal | ICD-10-CM

## 2021-07-26 DIAGNOSIS — D619 Aplastic anemia, unspecified: Principal | ICD-10-CM

## 2021-07-26 DIAGNOSIS — D801 Nonfamilial hypogammaglobulinemia: Principal | ICD-10-CM

## 2021-07-26 DIAGNOSIS — D702 Other drug-induced agranulocytosis: Principal | ICD-10-CM

## 2021-07-26 MED ORDER — TACROLIMUS 0.5 MG CAPSULE, IMMEDIATE-RELEASE
ORAL_CAPSULE | Freq: Two times a day (BID) | ORAL | 4 refills | 30 days | Status: CP
Start: 2021-07-26 — End: 2022-07-26

## 2021-07-26 MED ORDER — CLINDAMYCIN HCL 300 MG CAPSULE
ORAL_CAPSULE | Freq: Three times a day (TID) | ORAL | 0 refills | 21 days | Status: CP
Start: 2021-07-26 — End: 2021-08-16

## 2021-07-26 MED ORDER — FLUDROCORTISONE 0.1 MG TABLET
ORAL_TABLET | ORAL | 5 refills | 60 days | Status: CP
Start: 2021-07-26 — End: ?
  Filled 2021-08-01: qty 30, 60d supply, fill #0

## 2021-07-26 MED ORDER — OLANZAPINE 2.5 MG TABLET
ORAL_TABLET | Freq: Every evening | ORAL | 2 refills | 30 days | Status: CP
Start: 2021-07-26 — End: 2022-07-26

## 2021-07-28 ENCOUNTER — Telehealth: Payer: Self-pay | Admitting: Psychiatry

## 2021-07-28 NOTE — Telephone Encounter (Signed)
Pt stated Dr Donzetta Sprung said that she can start another anti depressant ,but in a different class from the previous one.Lithium and trintellix were stopped on 12/19

## 2021-07-28 NOTE — Telephone Encounter (Signed)
Call to [facility]

## 2021-07-28 NOTE — Telephone Encounter (Signed)
Pt advised that she is feeling depressed and doesn't think she can wait until a Feb opening for Dr. Clovis Pu.  She would like to get a message to him.  She said Dr. Clovis Pu knows she is off her anti-depressant, but she wants Dr. Clovis Pu to contact her or Dr. Donzetta Sprung in [place] to discuss starting her on a new anti-depressant.  Pls call her back.  No upcoming appt.

## 2021-07-28 NOTE — Telephone Encounter (Signed)
RTC  Saw Dr. Donzetta Sprung yesterday.  Feeling more depressed as cancer treatment drags on. Off lithium for a couple of weeks.  Off Trintellix for a couple of weeks. Originally got better with lithium added to nortriptyline [place].  Maintaining weight without feeding tube.   Mood is worse with depression since stopping the meds.  Discussed the significant risk with the patient and her husband of her stopping Trintellix and lithium.  We reviewed her psychiatric history that she had been depressed for a year before starting lithium.  At that time lithium was added to nortriptyline and the nortriptyline blood level was assessed and dose appropriately.  Eventually nortriptyline was changed to Trintellix to reduce side effect burden.  She has a significant risk of relapse off antidepressants.  She is aware of this.  We specifically discussed that lithium tends to increase white blood cell count not reduce white blood cell count.  We will attempt to reach Dr. Theodoro Clock and consider the option of using lithium alone to provide protection against relapse of depression.  Then if she does develop a relapse of depression we may resume nortriptyline which worked in the past and is in a different class from [employer], MD, DFAPA

## 2021-07-29 NOTE — Telephone Encounter (Signed)
Called and LM with Dr. Leotis Pain office yesterday for her to call.  Can't reach her through [employer]

## 2021-08-01 ENCOUNTER — Ambulatory Visit
Admit: 2021-08-01 | Discharge: 2021-08-02 | Payer: PRIVATE HEALTH INSURANCE | Attending: Primary Care | Primary: Primary Care

## 2021-08-01 ENCOUNTER — Other Ambulatory Visit: Admit: 2021-08-01 | Discharge: 2021-08-02 | Payer: PRIVATE HEALTH INSURANCE

## 2021-08-01 DIAGNOSIS — Z9484 Stem cells transplant status: Principal | ICD-10-CM

## 2021-08-01 DIAGNOSIS — Z9481 Bone marrow transplant status: Principal | ICD-10-CM

## 2021-08-01 MED ORDER — OLANZAPINE 2.5 MG TABLET
ORAL_TABLET | Freq: Every evening | ORAL | 2 refills | 30.00000 days | Status: CP
Start: 2021-08-01 — End: 2022-08-01

## 2021-08-01 MED ORDER — HYDROMORPHONE 2 MG TABLET
ORAL_TABLET | Freq: Four times a day (QID) | ORAL | 0 refills | 5 days | Status: CP | PRN
Start: 2021-08-01 — End: 2021-08-31

## 2021-08-02 MED ORDER — TACROLIMUS 0.5 MG CAPSULE, IMMEDIATE-RELEASE
ORAL_CAPSULE | ORAL | 4 refills | 30 days | Status: CP
Start: 2021-08-02 — End: 2022-08-02
  Filled 2021-08-01: qty 60, 30d supply, fill #0

## 2021-08-02 MED ORDER — OLANZAPINE 2.5 MG TABLET
ORAL_TABLET | Freq: Every evening | ORAL | 3 refills | 30 days | Status: CP
Start: 2021-08-02 — End: 2021-09-01

## 2021-08-03 MED ORDER — OLANZAPINE 5 MG TABLET
ORAL_TABLET | Freq: Every evening | ORAL | 3 refills | 30 days | Status: CP
Start: 2021-08-03 — End: 2022-08-03

## 2021-08-03 MED ORDER — OLANZAPINE 2.5 MG TABLET
ORAL_TABLET | Freq: Every morning | ORAL | 3 refills | 30.00000 days | Status: CP
Start: 2021-08-03 — End: 2021-09-02

## 2021-08-10 ENCOUNTER — Ambulatory Visit: Admit: 2021-08-10 | Discharge: 2021-08-11 | Payer: PRIVATE HEALTH INSURANCE

## 2021-08-10 ENCOUNTER — Other Ambulatory Visit: Admit: 2021-08-10 | Discharge: 2021-08-11 | Payer: PRIVATE HEALTH INSURANCE

## 2021-08-10 ENCOUNTER — Ambulatory Visit
Admit: 2021-08-10 | Discharge: 2021-08-11 | Payer: PRIVATE HEALTH INSURANCE | Attending: Infectious Disease | Primary: Infectious Disease

## 2021-08-10 ENCOUNTER — Ambulatory Visit
Admit: 2021-08-10 | Discharge: 2021-08-11 | Payer: PRIVATE HEALTH INSURANCE | Attending: Primary Care | Primary: Primary Care

## 2021-08-10 DIAGNOSIS — D708 Other neutropenia: Principal | ICD-10-CM

## 2021-08-10 DIAGNOSIS — Z9484 Stem cells transplant status: Principal | ICD-10-CM

## 2021-08-10 DIAGNOSIS — N179 Acute kidney failure, unspecified: Principal | ICD-10-CM

## 2021-08-10 DIAGNOSIS — Z9481 Bone marrow transplant status: Principal | ICD-10-CM

## 2021-08-10 DIAGNOSIS — D849 Immunodeficiency, unspecified: Principal | ICD-10-CM

## 2021-08-10 DIAGNOSIS — C9111 Chronic lymphocytic leukemia of B-cell type in remission: Principal | ICD-10-CM

## 2021-08-10 DIAGNOSIS — I10 Essential (primary) hypertension: Principal | ICD-10-CM

## 2021-08-16 ENCOUNTER — Ambulatory Visit
Admit: 2021-08-16 | Discharge: 2021-08-17 | Payer: PRIVATE HEALTH INSURANCE | Attending: Primary Care | Primary: Primary Care

## 2021-08-16 ENCOUNTER — Other Ambulatory Visit: Admit: 2021-08-16 | Discharge: 2021-08-17 | Payer: PRIVATE HEALTH INSURANCE

## 2021-08-16 ENCOUNTER — Ambulatory Visit: Admit: 2021-08-16 | Discharge: 2021-08-17 | Payer: PRIVATE HEALTH INSURANCE

## 2021-08-16 DIAGNOSIS — Z9481 Bone marrow transplant status: Principal | ICD-10-CM

## 2021-08-16 DIAGNOSIS — D708 Other neutropenia: Principal | ICD-10-CM

## 2021-08-16 DIAGNOSIS — D61818 Other pancytopenia: Principal | ICD-10-CM

## 2021-08-16 DIAGNOSIS — Z9484 Stem cells transplant status: Principal | ICD-10-CM

## 2021-08-16 DIAGNOSIS — I158 Other secondary hypertension: Principal | ICD-10-CM

## 2021-08-16 DIAGNOSIS — C9111 Chronic lymphocytic leukemia of B-cell type in remission: Principal | ICD-10-CM

## 2021-08-16 DIAGNOSIS — D84822 Immunocompromised state associated with stem cell transplant (CMS-HCC): Principal | ICD-10-CM

## 2021-08-16 MED ORDER — AMLODIPINE 5 MG TABLET
ORAL_TABLET | Freq: Every day | ORAL | 6 refills | 30 days | Status: CP
Start: 2021-08-16 — End: 2021-09-15

## 2021-08-22 DIAGNOSIS — Z9484 Stem cells transplant status: Principal | ICD-10-CM

## 2021-08-23 ENCOUNTER — Ambulatory Visit
Admit: 2021-08-23 | Discharge: 2021-08-23 | Payer: PRIVATE HEALTH INSURANCE | Attending: Primary Care | Primary: Primary Care

## 2021-08-23 ENCOUNTER — Ambulatory Visit: Admit: 2021-08-23 | Discharge: 2021-08-23 | Payer: PRIVATE HEALTH INSURANCE

## 2021-08-23 ENCOUNTER — Other Ambulatory Visit: Admit: 2021-08-23 | Discharge: 2021-08-23 | Payer: PRIVATE HEALTH INSURANCE

## 2021-08-23 DIAGNOSIS — C9111 Chronic lymphocytic leukemia of B-cell type in remission: Principal | ICD-10-CM

## 2021-08-23 DIAGNOSIS — Z9484 Stem cells transplant status: Principal | ICD-10-CM

## 2021-08-23 DIAGNOSIS — D849 Immunodeficiency, unspecified: Principal | ICD-10-CM

## 2021-08-23 DIAGNOSIS — D61818 Other pancytopenia: Principal | ICD-10-CM

## 2021-08-23 DIAGNOSIS — N179 Acute kidney failure, unspecified: Principal | ICD-10-CM

## 2021-08-24 DIAGNOSIS — C9110 Chronic lymphocytic leukemia of B-cell type not having achieved remission: Principal | ICD-10-CM

## 2021-08-24 DIAGNOSIS — F325 Major depressive disorder, single episode, in full remission: Principal | ICD-10-CM

## 2021-08-24 DIAGNOSIS — D801 Nonfamilial hypogammaglobulinemia: Principal | ICD-10-CM

## 2021-08-24 DIAGNOSIS — D619 Aplastic anemia, unspecified: Principal | ICD-10-CM

## 2021-08-24 MED ORDER — TACROLIMUS 0.5 MG CAPSULE, IMMEDIATE-RELEASE
ORAL_CAPSULE | Freq: Two times a day (BID) | ORAL | 4 refills | 30.00000 days | Status: CP
Start: 2021-08-24 — End: 2022-08-24
  Filled 2021-08-31: qty 60, 30d supply, fill #0

## 2021-08-31 ENCOUNTER — Other Ambulatory Visit: Admit: 2021-08-31 | Discharge: 2021-08-31 | Payer: PRIVATE HEALTH INSURANCE

## 2021-08-31 ENCOUNTER — Ambulatory Visit: Admit: 2021-08-31 | Discharge: 2021-08-31 | Payer: PRIVATE HEALTH INSURANCE

## 2021-08-31 DIAGNOSIS — R630 Anorexia: Secondary | ICD-10-CM | POA: Diagnosis not present

## 2021-08-31 DIAGNOSIS — Z79890 Hormone replacement therapy (postmenopausal): Secondary | ICD-10-CM | POA: Diagnosis not present

## 2021-08-31 DIAGNOSIS — Z931 Gastrostomy status: Secondary | ICD-10-CM | POA: Diagnosis not present

## 2021-08-31 DIAGNOSIS — E039 Hypothyroidism, unspecified: Secondary | ICD-10-CM | POA: Diagnosis not present

## 2021-08-31 DIAGNOSIS — D84822 Immunodeficiency due to external causes: Secondary | ICD-10-CM | POA: Diagnosis not present

## 2021-08-31 DIAGNOSIS — Z9221 Personal history of antineoplastic chemotherapy: Secondary | ICD-10-CM | POA: Diagnosis not present

## 2021-08-31 DIAGNOSIS — F319 Bipolar disorder, unspecified: Secondary | ICD-10-CM | POA: Diagnosis not present

## 2021-08-31 DIAGNOSIS — Z5181 Encounter for therapeutic drug level monitoring: Secondary | ICD-10-CM | POA: Diagnosis not present

## 2021-08-31 DIAGNOSIS — Z9481 Bone marrow transplant status: Secondary | ICD-10-CM | POA: Diagnosis not present

## 2021-08-31 DIAGNOSIS — D801 Nonfamilial hypogammaglobulinemia: Secondary | ICD-10-CM | POA: Diagnosis not present

## 2021-08-31 DIAGNOSIS — D61818 Other pancytopenia: Secondary | ICD-10-CM | POA: Diagnosis not present

## 2021-08-31 DIAGNOSIS — I129 Hypertensive chronic kidney disease with stage 1 through stage 4 chronic kidney disease, or unspecified chronic kidney disease: Secondary | ICD-10-CM | POA: Diagnosis not present

## 2021-08-31 DIAGNOSIS — B1910 Unspecified viral hepatitis B without hepatic coma: Secondary | ICD-10-CM | POA: Diagnosis not present

## 2021-08-31 DIAGNOSIS — C9110 Chronic lymphocytic leukemia of B-cell type not having achieved remission: Secondary | ICD-10-CM | POA: Diagnosis not present

## 2021-08-31 DIAGNOSIS — D709 Neutropenia, unspecified: Secondary | ICD-10-CM | POA: Diagnosis not present

## 2021-08-31 DIAGNOSIS — Z79899 Other long term (current) drug therapy: Secondary | ICD-10-CM | POA: Diagnosis not present

## 2021-08-31 DIAGNOSIS — Z48290 Encounter for aftercare following bone marrow transplant: Secondary | ICD-10-CM | POA: Diagnosis not present

## 2021-08-31 DIAGNOSIS — D619 Aplastic anemia, unspecified: Secondary | ICD-10-CM | POA: Diagnosis not present

## 2021-08-31 DIAGNOSIS — N183 Chronic kidney disease, stage 3 unspecified: Secondary | ICD-10-CM | POA: Diagnosis not present

## 2021-08-31 DIAGNOSIS — Z9484 Stem cells transplant status: Principal | ICD-10-CM

## 2021-08-31 MED ORDER — FLUDROCORTISONE 0.1 MG TABLET
ORAL_TABLET | 0 refills | 0 days | Status: CP
Start: 2021-08-31 — End: ?

## 2021-09-07 ENCOUNTER — Ambulatory Visit: Admit: 2021-09-07 | Discharge: 2021-09-07 | Payer: PRIVATE HEALTH INSURANCE

## 2021-09-07 ENCOUNTER — Other Ambulatory Visit: Admit: 2021-09-07 | Discharge: 2021-09-07 | Payer: PRIVATE HEALTH INSURANCE

## 2021-09-07 DIAGNOSIS — Z9484 Stem cells transplant status: Secondary | ICD-10-CM | POA: Diagnosis not present

## 2021-09-07 DIAGNOSIS — Z5111 Encounter for antineoplastic chemotherapy: Secondary | ICD-10-CM | POA: Diagnosis not present

## 2021-09-07 DIAGNOSIS — E039 Hypothyroidism, unspecified: Secondary | ICD-10-CM | POA: Diagnosis not present

## 2021-09-07 DIAGNOSIS — Z9481 Bone marrow transplant status: Secondary | ICD-10-CM | POA: Diagnosis not present

## 2021-09-07 DIAGNOSIS — N183 Chronic kidney disease, stage 3 unspecified: Secondary | ICD-10-CM | POA: Diagnosis not present

## 2021-09-07 DIAGNOSIS — Z79899 Other long term (current) drug therapy: Secondary | ICD-10-CM | POA: Diagnosis not present

## 2021-09-07 DIAGNOSIS — Z931 Gastrostomy status: Secondary | ICD-10-CM | POA: Diagnosis not present

## 2021-09-07 DIAGNOSIS — I129 Hypertensive chronic kidney disease with stage 1 through stage 4 chronic kidney disease, or unspecified chronic kidney disease: Secondary | ICD-10-CM | POA: Diagnosis not present

## 2021-09-07 DIAGNOSIS — Z79890 Hormone replacement therapy (postmenopausal): Secondary | ICD-10-CM | POA: Diagnosis not present

## 2021-09-07 DIAGNOSIS — D801 Nonfamilial hypogammaglobulinemia: Secondary | ICD-10-CM | POA: Diagnosis not present

## 2021-09-07 DIAGNOSIS — C9110 Chronic lymphocytic leukemia of B-cell type not having achieved remission: Secondary | ICD-10-CM | POA: Diagnosis not present

## 2021-09-07 DIAGNOSIS — D708 Other neutropenia: Secondary | ICD-10-CM | POA: Diagnosis not present

## 2021-09-07 DIAGNOSIS — F319 Bipolar disorder, unspecified: Secondary | ICD-10-CM | POA: Diagnosis not present

## 2021-09-07 DIAGNOSIS — D84822 Immunocompromised state associated with stem cell transplant (CMS-HCC): Principal | ICD-10-CM

## 2021-09-07 DIAGNOSIS — R7401 Elevation of levels of liver transaminase levels: Secondary | ICD-10-CM | POA: Diagnosis not present

## 2021-09-07 DIAGNOSIS — D619 Aplastic anemia, unspecified: Secondary | ICD-10-CM | POA: Diagnosis not present

## 2021-09-08 DIAGNOSIS — F325 Major depressive disorder, single episode, in full remission: Principal | ICD-10-CM

## 2021-09-08 DIAGNOSIS — C9110 Chronic lymphocytic leukemia of B-cell type not having achieved remission: Principal | ICD-10-CM

## 2021-09-08 DIAGNOSIS — D801 Nonfamilial hypogammaglobulinemia: Principal | ICD-10-CM

## 2021-09-08 DIAGNOSIS — D619 Aplastic anemia, unspecified: Principal | ICD-10-CM

## 2021-09-08 MED ORDER — TACROLIMUS 0.5 MG CAPSULE, IMMEDIATE-RELEASE
ORAL_CAPSULE | ORAL | 4 refills | 30 days | Status: CP
Start: 2021-09-08 — End: ?

## 2021-09-13 ENCOUNTER — Ambulatory Visit: Admit: 2021-09-13 | Discharge: 2021-09-14 | Payer: PRIVATE HEALTH INSURANCE

## 2021-09-13 ENCOUNTER — Other Ambulatory Visit: Admit: 2021-09-13 | Discharge: 2021-09-14 | Payer: PRIVATE HEALTH INSURANCE

## 2021-09-13 DIAGNOSIS — Z9481 Bone marrow transplant status: Secondary | ICD-10-CM | POA: Diagnosis not present

## 2021-09-13 DIAGNOSIS — D696 Thrombocytopenia, unspecified: Secondary | ICD-10-CM | POA: Diagnosis not present

## 2021-09-13 DIAGNOSIS — D849 Immunodeficiency, unspecified: Secondary | ICD-10-CM | POA: Diagnosis not present

## 2021-09-13 DIAGNOSIS — D649 Anemia, unspecified: Secondary | ICD-10-CM | POA: Diagnosis not present

## 2021-09-13 DIAGNOSIS — C9111 Chronic lymphocytic leukemia of B-cell type in remission: Secondary | ICD-10-CM | POA: Diagnosis not present

## 2021-09-13 DIAGNOSIS — D619 Aplastic anemia, unspecified: Secondary | ICD-10-CM | POA: Diagnosis not present

## 2021-09-13 DIAGNOSIS — Z9484 Stem cells transplant status: Secondary | ICD-10-CM | POA: Diagnosis not present

## 2021-09-13 DIAGNOSIS — F325 Major depressive disorder, single episode, in full remission: Principal | ICD-10-CM

## 2021-09-13 DIAGNOSIS — C9110 Chronic lymphocytic leukemia of B-cell type not having achieved remission: Principal | ICD-10-CM

## 2021-09-13 DIAGNOSIS — D729 Disorder of white blood cells, unspecified: Principal | ICD-10-CM

## 2021-09-13 DIAGNOSIS — D801 Nonfamilial hypogammaglobulinemia: Principal | ICD-10-CM

## 2021-09-13 MED ORDER — OLANZAPINE 2.5 MG TABLET
ORAL_TABLET | Freq: Every morning | ORAL | 3 refills | 30 days
Start: 2021-09-13 — End: 2021-10-13

## 2021-09-13 MED ORDER — TACROLIMUS 0.5 MG CAPSULE, IMMEDIATE-RELEASE
ORAL_CAPSULE | ORAL | 4 refills | 30 days
Start: 2021-09-13 — End: ?

## 2021-09-13 MED ORDER — OLANZAPINE 5 MG TABLET
ORAL_TABLET | Freq: Every evening | ORAL | 3 refills | 30 days
Start: 2021-09-13 — End: 2022-09-13

## 2021-09-14 ENCOUNTER — Other Ambulatory Visit: Payer: Self-pay | Admitting: *Deleted

## 2021-09-14 MED ORDER — TACROLIMUS 0.5 MG CAPSULE, IMMEDIATE-RELEASE
ORAL_CAPSULE | ORAL | 4 refills | 30 days | Status: CP
Start: 2021-09-14 — End: ?

## 2021-09-14 NOTE — Progress Notes (Signed)
Dr. Benay Spice spoke w/Mr. [place]illa and patient needs CBC/diff and requesting to have lab done here to avoid drive to [facility]. Scheduling message sent.

## 2021-09-16 ENCOUNTER — Ambulatory Visit: Admit: 2021-09-16 | Payer: PRIVATE HEALTH INSURANCE

## 2021-09-16 ENCOUNTER — Inpatient Hospital Stay: Payer: BC Managed Care – PPO | Attending: Oncology

## 2021-09-16 ENCOUNTER — Other Ambulatory Visit: Payer: Self-pay

## 2021-09-16 ENCOUNTER — Telehealth: Payer: Self-pay | Admitting: *Deleted

## 2021-09-16 DIAGNOSIS — C9110 Chronic lymphocytic leukemia of B-cell type not having achieved remission: Secondary | ICD-10-CM | POA: Diagnosis not present

## 2021-09-16 LAB — CBC WITH DIFFERENTIAL (CANCER CENTER ONLY)
Abs Immature Granulocytes: 0.14 10*3/uL — ABNORMAL HIGH (ref 0.00–0.07)
Basophils Absolute: 0 10*3/uL (ref 0.0–0.1)
Basophils Relative: 0 %
Eosinophils Absolute: 0.1 10*3/uL (ref 0.0–0.5)
Eosinophils Relative: 1 %
HCT: 25.5 % — ABNORMAL LOW (ref 36.0–46.0)
Hemoglobin: 8.1 g/dL — ABNORMAL LOW (ref 12.0–15.0)
Immature Granulocytes: 1 %
Lymphocytes Relative: 15 %
Lymphs Abs: 1.5 10*3/uL (ref 0.7–4.0)
MCH: 30.9 pg (ref 26.0–34.0)
MCHC: 31.8 g/dL (ref 30.0–36.0)
MCV: 97.3 fL (ref 80.0–100.0)
Monocytes Absolute: 0.4 10*3/uL (ref 0.1–1.0)
Monocytes Relative: 4 %
Neutro Abs: 7.8 10*3/uL — ABNORMAL HIGH (ref 1.7–7.7)
Neutrophils Relative %: 79 %
Platelet Count: 54 10*3/uL — ABNORMAL LOW (ref 150–400)
RBC: 2.62 MIL/uL — ABNORMAL LOW (ref 3.87–5.11)
RDW: 14.5 % (ref 11.5–15.5)
WBC Count: 10 10*3/uL (ref 4.0–10.5)
nRBC: 0 % (ref 0.0–0.2)

## 2021-09-16 NOTE — Telephone Encounter (Signed)
Called today's CBC/Diff results to [place], APP w/transplant team at [facility]. Copy also given to [place] before leaving.

## 2021-09-17 DIAGNOSIS — C9110 Chronic lymphocytic leukemia of B-cell type not having achieved remission: Secondary | ICD-10-CM | POA: Diagnosis not present

## 2021-09-19 MED ORDER — OLANZAPINE 5 MG TABLET
ORAL_TABLET | Freq: Every evening | ORAL | 3 refills | 30 days | Status: CP
Start: 2021-09-19 — End: 2022-09-19

## 2021-09-19 MED ORDER — OLANZAPINE 2.5 MG TABLET
ORAL_TABLET | Freq: Every morning | ORAL | 3 refills | 30 days | Status: CP
Start: 2021-09-19 — End: 2021-10-19

## 2021-09-21 ENCOUNTER — Ambulatory Visit
Admit: 2021-09-21 | Discharge: 2021-09-22 | Payer: PRIVATE HEALTH INSURANCE | Attending: Infectious Disease | Primary: Infectious Disease

## 2021-09-21 ENCOUNTER — Other Ambulatory Visit: Admit: 2021-09-21 | Discharge: 2021-09-22 | Payer: PRIVATE HEALTH INSURANCE

## 2021-09-21 ENCOUNTER — Ambulatory Visit: Admit: 2021-09-21 | Discharge: 2021-09-22 | Payer: PRIVATE HEALTH INSURANCE | Attending: Oncology | Primary: Oncology

## 2021-09-21 ENCOUNTER — Encounter
Admit: 2021-09-21 | Discharge: 2021-09-22 | Payer: PRIVATE HEALTH INSURANCE | Attending: Nurse Practitioner | Primary: Nurse Practitioner

## 2021-09-21 ENCOUNTER — Ambulatory Visit
Admit: 2021-09-21 | Discharge: 2021-09-22 | Payer: PRIVATE HEALTH INSURANCE | Attending: Nurse Practitioner | Primary: Nurse Practitioner

## 2021-09-21 ENCOUNTER — Ambulatory Visit: Admit: 2021-09-21 | Discharge: 2021-09-22 | Payer: PRIVATE HEALTH INSURANCE

## 2021-09-21 DIAGNOSIS — D708 Other neutropenia: Secondary | ICD-10-CM | POA: Diagnosis not present

## 2021-09-21 DIAGNOSIS — Z8616 Personal history of COVID-19: Secondary | ICD-10-CM | POA: Diagnosis not present

## 2021-09-21 DIAGNOSIS — C9111 Chronic lymphocytic leukemia of B-cell type in remission: Secondary | ICD-10-CM | POA: Diagnosis not present

## 2021-09-21 DIAGNOSIS — Z7682 Awaiting organ transplant status: Secondary | ICD-10-CM | POA: Diagnosis not present

## 2021-09-21 DIAGNOSIS — Z9484 Stem cells transplant status: Secondary | ICD-10-CM | POA: Diagnosis not present

## 2021-09-21 DIAGNOSIS — Z79899 Other long term (current) drug therapy: Secondary | ICD-10-CM | POA: Diagnosis not present

## 2021-09-21 DIAGNOSIS — J168 Pneumonia due to other specified infectious organisms: Secondary | ICD-10-CM | POA: Diagnosis not present

## 2021-09-21 DIAGNOSIS — Z931 Gastrostomy status: Secondary | ICD-10-CM | POA: Diagnosis not present

## 2021-09-21 DIAGNOSIS — B259 Cytomegaloviral disease, unspecified: Secondary | ICD-10-CM | POA: Diagnosis not present

## 2021-09-21 DIAGNOSIS — C9110 Chronic lymphocytic leukemia of B-cell type not having achieved remission: Secondary | ICD-10-CM | POA: Diagnosis not present

## 2021-09-21 DIAGNOSIS — T865 Complications of stem cell transplant: Secondary | ICD-10-CM | POA: Diagnosis not present

## 2021-09-21 DIAGNOSIS — Z79890 Hormone replacement therapy (postmenopausal): Secondary | ICD-10-CM | POA: Diagnosis not present

## 2021-09-21 DIAGNOSIS — B1910 Unspecified viral hepatitis B without hepatic coma: Secondary | ICD-10-CM | POA: Diagnosis not present

## 2021-09-21 DIAGNOSIS — Z803 Family history of malignant neoplasm of breast: Secondary | ICD-10-CM | POA: Diagnosis not present

## 2021-09-21 DIAGNOSIS — D619 Aplastic anemia, unspecified: Secondary | ICD-10-CM | POA: Diagnosis not present

## 2021-09-21 DIAGNOSIS — D849 Immunodeficiency, unspecified: Secondary | ICD-10-CM | POA: Diagnosis not present

## 2021-09-21 DIAGNOSIS — R918 Other nonspecific abnormal finding of lung field: Secondary | ICD-10-CM | POA: Diagnosis not present

## 2021-09-21 DIAGNOSIS — Z792 Long term (current) use of antibiotics: Secondary | ICD-10-CM | POA: Diagnosis not present

## 2021-09-21 DIAGNOSIS — N183 Chronic kidney disease, stage 3 unspecified: Secondary | ICD-10-CM | POA: Diagnosis not present

## 2021-09-21 DIAGNOSIS — E039 Hypothyroidism, unspecified: Secondary | ICD-10-CM | POA: Diagnosis not present

## 2021-09-21 DIAGNOSIS — F319 Bipolar disorder, unspecified: Secondary | ICD-10-CM | POA: Diagnosis not present

## 2021-09-21 DIAGNOSIS — Z9481 Bone marrow transplant status: Secondary | ICD-10-CM | POA: Diagnosis not present

## 2021-09-21 DIAGNOSIS — Z9221 Personal history of antineoplastic chemotherapy: Secondary | ICD-10-CM | POA: Diagnosis not present

## 2021-09-21 DIAGNOSIS — R161 Splenomegaly, not elsewhere classified: Secondary | ICD-10-CM | POA: Diagnosis not present

## 2021-09-21 DIAGNOSIS — I3139 Other pericardial effusion (noninflammatory): Secondary | ICD-10-CM | POA: Diagnosis not present

## 2021-09-21 DIAGNOSIS — Z23 Encounter for immunization: Principal | ICD-10-CM

## 2021-09-21 MED ORDER — FLUDROCORTISONE 0.1 MG TABLET
ORAL_TABLET | Freq: Every day | ORAL | 0 refills | 30 days | Status: CP
Start: 2021-09-21 — End: ?

## 2021-09-21 MED ORDER — LOKELMA 10 GRAM ORAL POWDER PACKET
PACK | Freq: Every day | ORAL | 0 refills | 14 days | Status: CP
Start: 2021-09-21 — End: 2021-09-21

## 2021-09-21 MED FILL — LEVOFLOXACIN 250 MG TABLET: ORAL | 60 days supply | Qty: 60 | Fill #1

## 2021-09-22 MED ORDER — POSACONAZOLE 100 MG TABLET,DELAYED RELEASE
ORAL_TABLET | Freq: Every day | ORAL | 3 refills | 30.00000 days | Status: CP
Start: 2021-09-22 — End: 2021-09-21

## 2021-09-23 DIAGNOSIS — Z7682 Awaiting organ transplant status: Secondary | ICD-10-CM | POA: Diagnosis not present

## 2021-09-26 ENCOUNTER — Ambulatory Visit
Admit: 2021-09-26 | Discharge: 2021-09-27 | Payer: PRIVATE HEALTH INSURANCE | Attending: Primary Care | Primary: Primary Care

## 2021-09-26 ENCOUNTER — Other Ambulatory Visit: Admit: 2021-09-26 | Discharge: 2021-09-27 | Payer: PRIVATE HEALTH INSURANCE

## 2021-09-26 DIAGNOSIS — C9111 Chronic lymphocytic leukemia of B-cell type in remission: Secondary | ICD-10-CM | POA: Diagnosis not present

## 2021-09-26 DIAGNOSIS — N183 Chronic kidney disease, stage 3 unspecified: Secondary | ICD-10-CM | POA: Diagnosis not present

## 2021-09-26 DIAGNOSIS — Z9484 Stem cells transplant status: Secondary | ICD-10-CM | POA: Diagnosis not present

## 2021-09-26 DIAGNOSIS — Z9481 Bone marrow transplant status: Secondary | ICD-10-CM | POA: Diagnosis not present

## 2021-09-26 DIAGNOSIS — D849 Immunodeficiency, unspecified: Secondary | ICD-10-CM | POA: Diagnosis not present

## 2021-09-26 DIAGNOSIS — Z79890 Hormone replacement therapy (postmenopausal): Secondary | ICD-10-CM | POA: Diagnosis not present

## 2021-09-26 DIAGNOSIS — Z79899 Other long term (current) drug therapy: Secondary | ICD-10-CM | POA: Diagnosis not present

## 2021-09-26 DIAGNOSIS — K5900 Constipation, unspecified: Secondary | ICD-10-CM | POA: Diagnosis not present

## 2021-09-26 DIAGNOSIS — E039 Hypothyroidism, unspecified: Secondary | ICD-10-CM | POA: Diagnosis not present

## 2021-09-26 DIAGNOSIS — D61818 Other pancytopenia: Secondary | ICD-10-CM | POA: Diagnosis not present

## 2021-09-26 DIAGNOSIS — D708 Other neutropenia: Principal | ICD-10-CM

## 2021-09-27 MED ORDER — TACROLIMUS 0.5 MG CAPSULE, IMMEDIATE-RELEASE
ORAL_CAPSULE | Freq: Two times a day (BID) | ORAL | 3 refills | 30 days | Status: CP
Start: 2021-09-27 — End: ?

## 2021-10-05 ENCOUNTER — Other Ambulatory Visit: Admit: 2021-10-05 | Discharge: 2021-10-05 | Payer: PRIVATE HEALTH INSURANCE

## 2021-10-05 ENCOUNTER — Ambulatory Visit: Admit: 2021-10-05 | Discharge: 2021-10-05 | Payer: PRIVATE HEALTH INSURANCE

## 2021-10-05 DIAGNOSIS — D801 Nonfamilial hypogammaglobulinemia: Secondary | ICD-10-CM | POA: Diagnosis not present

## 2021-10-05 DIAGNOSIS — D708 Other neutropenia: Secondary | ICD-10-CM | POA: Diagnosis not present

## 2021-10-05 DIAGNOSIS — Z79890 Hormone replacement therapy (postmenopausal): Secondary | ICD-10-CM | POA: Diagnosis not present

## 2021-10-05 DIAGNOSIS — Z9484 Stem cells transplant status: Secondary | ICD-10-CM | POA: Diagnosis not present

## 2021-10-05 DIAGNOSIS — Z79621 Long term (current) use of calcineurin inhibitor: Secondary | ICD-10-CM | POA: Diagnosis not present

## 2021-10-05 DIAGNOSIS — E039 Hypothyroidism, unspecified: Secondary | ICD-10-CM | POA: Diagnosis not present

## 2021-10-05 DIAGNOSIS — K5900 Constipation, unspecified: Secondary | ICD-10-CM | POA: Diagnosis not present

## 2021-10-05 DIAGNOSIS — C9110 Chronic lymphocytic leukemia of B-cell type not having achieved remission: Secondary | ICD-10-CM | POA: Diagnosis not present

## 2021-10-05 DIAGNOSIS — D619 Aplastic anemia, unspecified: Secondary | ICD-10-CM | POA: Diagnosis not present

## 2021-10-05 DIAGNOSIS — Z79899 Other long term (current) drug therapy: Secondary | ICD-10-CM | POA: Diagnosis not present

## 2021-10-05 DIAGNOSIS — I129 Hypertensive chronic kidney disease with stage 1 through stage 4 chronic kidney disease, or unspecified chronic kidney disease: Secondary | ICD-10-CM | POA: Diagnosis not present

## 2021-10-05 DIAGNOSIS — N183 Chronic kidney disease, stage 3 unspecified: Secondary | ICD-10-CM | POA: Diagnosis not present

## 2021-10-05 DIAGNOSIS — R630 Anorexia: Secondary | ICD-10-CM | POA: Diagnosis not present

## 2021-10-05 DIAGNOSIS — F319 Bipolar disorder, unspecified: Secondary | ICD-10-CM | POA: Diagnosis not present

## 2021-10-05 DIAGNOSIS — G4700 Insomnia, unspecified: Secondary | ICD-10-CM | POA: Diagnosis not present

## 2021-10-05 DIAGNOSIS — D84822 Immunodeficiency due to external causes: Secondary | ICD-10-CM | POA: Diagnosis not present

## 2021-10-05 MED ORDER — TRAZODONE 50 MG TABLET
ORAL_TABLET | Freq: Every evening | ORAL | 3 refills | 30 days | Status: CP
Start: 2021-10-05 — End: 2021-11-04

## 2021-10-05 MED ORDER — OLANZAPINE 5 MG TABLET
ORAL_TABLET | Freq: Every evening | ORAL | 3 refills | 30 days | Status: CP
Start: 2021-10-05 — End: 2022-10-05

## 2021-10-06 DIAGNOSIS — C9110 Chronic lymphocytic leukemia of B-cell type not having achieved remission: Principal | ICD-10-CM

## 2021-10-06 DIAGNOSIS — F325 Major depressive disorder, single episode, in full remission: Principal | ICD-10-CM

## 2021-10-06 DIAGNOSIS — D619 Aplastic anemia, unspecified: Principal | ICD-10-CM

## 2021-10-06 DIAGNOSIS — D801 Nonfamilial hypogammaglobulinemia: Principal | ICD-10-CM

## 2021-10-06 MED ORDER — TACROLIMUS 0.5 MG CAPSULE, IMMEDIATE-RELEASE
ORAL_CAPSULE | ORAL | 3 refills | 30 days | Status: CP
Start: 2021-10-06 — End: ?

## 2021-10-07 DIAGNOSIS — Z9484 Stem cells transplant status: Principal | ICD-10-CM

## 2021-10-07 MED ORDER — LEVOTHYROXINE 100 MCG TABLET
ORAL_TABLET | Freq: Every day | GASTROSTOMY | 1 refills | 90 days | Status: CP
Start: 2021-10-07 — End: ?

## 2021-10-12 ENCOUNTER — Ambulatory Visit: Admit: 2021-10-12 | Discharge: 2021-10-13 | Payer: PRIVATE HEALTH INSURANCE

## 2021-10-12 ENCOUNTER — Other Ambulatory Visit: Admit: 2021-10-12 | Discharge: 2021-10-13 | Payer: PRIVATE HEALTH INSURANCE

## 2021-10-12 DIAGNOSIS — Z79890 Hormone replacement therapy (postmenopausal): Secondary | ICD-10-CM | POA: Diagnosis not present

## 2021-10-12 DIAGNOSIS — D630 Anemia in neoplastic disease: Secondary | ICD-10-CM | POA: Diagnosis not present

## 2021-10-12 DIAGNOSIS — Z79899 Other long term (current) drug therapy: Secondary | ICD-10-CM | POA: Diagnosis not present

## 2021-10-12 DIAGNOSIS — C9110 Chronic lymphocytic leukemia of B-cell type not having achieved remission: Secondary | ICD-10-CM | POA: Diagnosis not present

## 2021-10-12 DIAGNOSIS — Z9484 Stem cells transplant status: Secondary | ICD-10-CM | POA: Diagnosis not present

## 2021-10-12 DIAGNOSIS — R911 Solitary pulmonary nodule: Secondary | ICD-10-CM | POA: Diagnosis not present

## 2021-10-12 DIAGNOSIS — Z48290 Encounter for aftercare following bone marrow transplant: Secondary | ICD-10-CM | POA: Diagnosis not present

## 2021-10-12 DIAGNOSIS — N183 Chronic kidney disease, stage 3 unspecified: Secondary | ICD-10-CM | POA: Diagnosis not present

## 2021-10-12 DIAGNOSIS — R161 Splenomegaly, not elsewhere classified: Secondary | ICD-10-CM | POA: Diagnosis not present

## 2021-10-12 DIAGNOSIS — R630 Anorexia: Secondary | ICD-10-CM | POA: Diagnosis not present

## 2021-10-12 DIAGNOSIS — E876 Hypokalemia: Secondary | ICD-10-CM | POA: Diagnosis not present

## 2021-10-12 DIAGNOSIS — F319 Bipolar disorder, unspecified: Secondary | ICD-10-CM | POA: Diagnosis not present

## 2021-10-12 DIAGNOSIS — B1910 Unspecified viral hepatitis B without hepatic coma: Secondary | ICD-10-CM | POA: Diagnosis not present

## 2021-10-12 DIAGNOSIS — D801 Nonfamilial hypogammaglobulinemia: Secondary | ICD-10-CM | POA: Diagnosis not present

## 2021-10-12 DIAGNOSIS — D61818 Other pancytopenia: Secondary | ICD-10-CM | POA: Diagnosis not present

## 2021-10-12 DIAGNOSIS — E039 Hypothyroidism, unspecified: Secondary | ICD-10-CM | POA: Diagnosis not present

## 2021-10-12 DIAGNOSIS — I129 Hypertensive chronic kidney disease with stage 1 through stage 4 chronic kidney disease, or unspecified chronic kidney disease: Secondary | ICD-10-CM | POA: Diagnosis not present

## 2021-10-12 DIAGNOSIS — Z9481 Bone marrow transplant status: Principal | ICD-10-CM

## 2021-10-12 MED ORDER — FLUDROCORTISONE 0.1 MG TABLET
ORAL_TABLET | 3 refills | 0 days | Status: CP
Start: 2021-10-12 — End: ?

## 2021-10-13 MED ORDER — TACROLIMUS 0.5 MG CAPSULE, IMMEDIATE-RELEASE
ORAL_CAPSULE | Freq: Two times a day (BID) | ORAL | 3 refills | 30 days | Status: CP
Start: 2021-10-13 — End: ?

## 2021-10-15 DIAGNOSIS — C9110 Chronic lymphocytic leukemia of B-cell type not having achieved remission: Secondary | ICD-10-CM | POA: Diagnosis not present

## 2021-10-19 ENCOUNTER — Ambulatory Visit: Admit: 2021-10-19 | Discharge: 2021-10-19 | Payer: PRIVATE HEALTH INSURANCE

## 2021-10-19 ENCOUNTER — Other Ambulatory Visit: Admit: 2021-10-19 | Discharge: 2021-10-19 | Payer: PRIVATE HEALTH INSURANCE

## 2021-10-19 DIAGNOSIS — D619 Aplastic anemia, unspecified: Secondary | ICD-10-CM | POA: Diagnosis not present

## 2021-10-19 DIAGNOSIS — I129 Hypertensive chronic kidney disease with stage 1 through stage 4 chronic kidney disease, or unspecified chronic kidney disease: Secondary | ICD-10-CM | POA: Diagnosis not present

## 2021-10-19 DIAGNOSIS — D709 Neutropenia, unspecified: Secondary | ICD-10-CM | POA: Diagnosis not present

## 2021-10-19 DIAGNOSIS — N183 Chronic kidney disease, stage 3 unspecified: Secondary | ICD-10-CM | POA: Diagnosis not present

## 2021-10-19 DIAGNOSIS — Z5181 Encounter for therapeutic drug level monitoring: Secondary | ICD-10-CM | POA: Diagnosis not present

## 2021-10-19 DIAGNOSIS — D84822 Immunodeficiency due to external causes: Secondary | ICD-10-CM | POA: Diagnosis not present

## 2021-10-19 DIAGNOSIS — E876 Hypokalemia: Secondary | ICD-10-CM | POA: Diagnosis not present

## 2021-10-19 DIAGNOSIS — Z79890 Hormone replacement therapy (postmenopausal): Secondary | ICD-10-CM | POA: Diagnosis not present

## 2021-10-19 DIAGNOSIS — F319 Bipolar disorder, unspecified: Secondary | ICD-10-CM | POA: Diagnosis not present

## 2021-10-19 DIAGNOSIS — E039 Hypothyroidism, unspecified: Secondary | ICD-10-CM | POA: Diagnosis not present

## 2021-10-19 DIAGNOSIS — Z9484 Stem cells transplant status: Secondary | ICD-10-CM | POA: Diagnosis not present

## 2021-10-19 DIAGNOSIS — R630 Anorexia: Secondary | ICD-10-CM | POA: Diagnosis not present

## 2021-10-19 DIAGNOSIS — B1910 Unspecified viral hepatitis B without hepatic coma: Secondary | ICD-10-CM | POA: Diagnosis not present

## 2021-10-19 DIAGNOSIS — D801 Nonfamilial hypogammaglobulinemia: Secondary | ICD-10-CM | POA: Diagnosis not present

## 2021-10-19 DIAGNOSIS — Q998 Other specified chromosome abnormalities: Secondary | ICD-10-CM | POA: Diagnosis not present

## 2021-10-19 DIAGNOSIS — C9110 Chronic lymphocytic leukemia of B-cell type not having achieved remission: Secondary | ICD-10-CM | POA: Diagnosis not present

## 2021-10-19 DIAGNOSIS — Z9221 Personal history of antineoplastic chemotherapy: Secondary | ICD-10-CM | POA: Diagnosis not present

## 2021-10-19 DIAGNOSIS — R161 Splenomegaly, not elsewhere classified: Secondary | ICD-10-CM | POA: Diagnosis not present

## 2021-10-19 DIAGNOSIS — Z9481 Bone marrow transplant status: Principal | ICD-10-CM

## 2021-10-19 MED ORDER — TRAZODONE 50 MG TABLET
ORAL_TABLET | Freq: Every evening | ORAL | 3 refills | 30 days | Status: CP
Start: 2021-10-19 — End: 2021-11-18

## 2021-10-19 MED ORDER — POTASSIUM CHLORIDE ER 20 MEQ TABLET,EXTENDED RELEASE(PART/CRYST)
ORAL_TABLET | Freq: Every day | ORAL | 0 refills | 10 days | Status: CP
Start: 2021-10-19 — End: 2022-10-19

## 2021-10-19 MED FILL — MG-PLUS-PROTEIN 133 MG TABLET: ORAL | 50 days supply | Qty: 100 | Fill #1

## 2021-10-28 DIAGNOSIS — Z9484 Stem cells transplant status: Principal | ICD-10-CM

## 2021-10-31 DIAGNOSIS — Z9481 Bone marrow transplant status: Principal | ICD-10-CM

## 2021-10-31 MED ORDER — CLINDAMYCIN HCL 300 MG CAPSULE
ORAL_CAPSULE | Freq: Three times a day (TID) | ORAL | 0 refills | 0 days
Start: 2021-10-31 — End: ?

## 2021-11-01 MED ORDER — CLINDAMYCIN HCL 300 MG CAPSULE
ORAL_CAPSULE | Freq: Three times a day (TID) | ORAL | 0 refills | 21 days
Start: 2021-11-01 — End: 2021-11-22

## 2021-11-02 ENCOUNTER — Ambulatory Visit: Admit: 2021-11-02 | Discharge: 2021-11-03 | Payer: PRIVATE HEALTH INSURANCE

## 2021-11-02 ENCOUNTER — Other Ambulatory Visit: Admit: 2021-11-02 | Discharge: 2021-11-03 | Payer: PRIVATE HEALTH INSURANCE

## 2021-11-02 DIAGNOSIS — I10 Essential (primary) hypertension: Secondary | ICD-10-CM | POA: Diagnosis not present

## 2021-11-02 DIAGNOSIS — F319 Bipolar disorder, unspecified: Secondary | ICD-10-CM | POA: Diagnosis not present

## 2021-11-02 DIAGNOSIS — Z79890 Hormone replacement therapy (postmenopausal): Secondary | ICD-10-CM | POA: Diagnosis not present

## 2021-11-02 DIAGNOSIS — D801 Nonfamilial hypogammaglobulinemia: Secondary | ICD-10-CM | POA: Diagnosis not present

## 2021-11-02 DIAGNOSIS — C9110 Chronic lymphocytic leukemia of B-cell type not having achieved remission: Secondary | ICD-10-CM | POA: Diagnosis not present

## 2021-11-02 DIAGNOSIS — D619 Aplastic anemia, unspecified: Secondary | ICD-10-CM | POA: Diagnosis not present

## 2021-11-02 DIAGNOSIS — Z9484 Stem cells transplant status: Secondary | ICD-10-CM | POA: Diagnosis not present

## 2021-11-02 DIAGNOSIS — D849 Immunodeficiency, unspecified: Secondary | ICD-10-CM | POA: Diagnosis not present

## 2021-11-02 DIAGNOSIS — Z79621 Long term (current) use of calcineurin inhibitor: Secondary | ICD-10-CM | POA: Diagnosis not present

## 2021-11-02 DIAGNOSIS — Z9481 Bone marrow transplant status: Secondary | ICD-10-CM | POA: Diagnosis not present

## 2021-11-02 DIAGNOSIS — E039 Hypothyroidism, unspecified: Secondary | ICD-10-CM | POA: Diagnosis not present

## 2021-11-07 ENCOUNTER — Other Ambulatory Visit: Payer: Self-pay | Admitting: Internal Medicine

## 2021-11-07 DIAGNOSIS — Z1231 Encounter for screening mammogram for malignant neoplasm of breast: Secondary | ICD-10-CM

## 2021-11-08 ENCOUNTER — Other Ambulatory Visit: Payer: Self-pay

## 2021-11-08 ENCOUNTER — Telehealth: Payer: Self-pay | Admitting: *Deleted

## 2021-11-08 DIAGNOSIS — C9110 Chronic lymphocytic leukemia of B-cell type not having achieved remission: Secondary | ICD-10-CM

## 2021-11-08 NOTE — Telephone Encounter (Signed)
Meagan Walsh called to request appointment on 4/19 for CBC/diff in preparation for colonoscopy scheduled for 11/09/21. High priority scheduling message sent and she was informed she will be called for appointment. She wishes to avoid the 1130 to 1330 time slot. ? ?

## 2021-11-09 ENCOUNTER — Inpatient Hospital Stay: Payer: BC Managed Care – PPO | Attending: Oncology

## 2021-11-09 ENCOUNTER — Telehealth: Payer: Self-pay | Admitting: *Deleted

## 2021-11-09 DIAGNOSIS — C9110 Chronic lymphocytic leukemia of B-cell type not having achieved remission: Secondary | ICD-10-CM | POA: Diagnosis not present

## 2021-11-09 LAB — CBC WITH DIFFERENTIAL (CANCER CENTER ONLY)
Abs Immature Granulocytes: 0.31 10*3/uL — ABNORMAL HIGH (ref 0.00–0.07)
Basophils Absolute: 0.1 10*3/uL (ref 0.0–0.1)
Basophils Relative: 1 %
Eosinophils Absolute: 0.2 10*3/uL (ref 0.0–0.5)
Eosinophils Relative: 2 %
HCT: 28.3 % — ABNORMAL LOW (ref 36.0–46.0)
Hemoglobin: 9.1 g/dL — ABNORMAL LOW (ref 12.0–15.0)
Immature Granulocytes: 2 %
Lymphocytes Relative: 16 %
Lymphs Abs: 2 10*3/uL (ref 0.7–4.0)
MCH: 31.6 pg (ref 26.0–34.0)
MCHC: 32.2 g/dL (ref 30.0–36.0)
MCV: 98.3 fL (ref 80.0–100.0)
Monocytes Absolute: 0.6 10*3/uL (ref 0.1–1.0)
Monocytes Relative: 4 %
Neutro Abs: 9.8 10*3/uL — ABNORMAL HIGH (ref 1.7–7.7)
Neutrophils Relative %: 75 %
Platelet Count: 81 10*3/uL — ABNORMAL LOW (ref 150–400)
RBC: 2.88 MIL/uL — ABNORMAL LOW (ref 3.87–5.11)
RDW: 16.2 % — ABNORMAL HIGH (ref 11.5–15.5)
WBC Count: 13 10*3/uL — ABNORMAL HIGH (ref 4.0–10.5)
nRBC: 0 % (ref 0.0–0.2)

## 2021-11-09 NOTE — Telephone Encounter (Signed)
Notified Niomie that Dr. Benay Spice said her CBC looks good. Routed and faxed copy to Dr. Watt Climes per her request. ?

## 2021-11-10 ENCOUNTER — Ambulatory Visit: Payer: BC Managed Care – PPO

## 2021-11-11 MED ORDER — TRAZODONE 50 MG TABLET
ORAL_TABLET | 2 refills | 0 days
Start: 2021-11-11 — End: ?

## 2021-11-15 ENCOUNTER — Ambulatory Visit: Admit: 2021-11-15 | Discharge: 2021-11-16 | Payer: PRIVATE HEALTH INSURANCE

## 2021-11-15 ENCOUNTER — Other Ambulatory Visit: Admit: 2021-11-15 | Discharge: 2021-11-16 | Payer: PRIVATE HEALTH INSURANCE

## 2021-11-15 DIAGNOSIS — Z9481 Bone marrow transplant status: Secondary | ICD-10-CM | POA: Diagnosis not present

## 2021-11-15 DIAGNOSIS — C9110 Chronic lymphocytic leukemia of B-cell type not having achieved remission: Secondary | ICD-10-CM | POA: Diagnosis not present

## 2021-11-15 DIAGNOSIS — D84822 Immunodeficiency due to external causes: Secondary | ICD-10-CM | POA: Diagnosis not present

## 2021-11-15 DIAGNOSIS — D708 Other neutropenia: Secondary | ICD-10-CM | POA: Diagnosis not present

## 2021-11-15 DIAGNOSIS — Z9484 Stem cells transplant status: Secondary | ICD-10-CM | POA: Diagnosis not present

## 2021-11-15 MED ORDER — FLUDROCORTISONE 0.1 MG TABLET
ORAL_TABLET | 3 refills | 0 days | Status: CP
Start: 2021-11-15 — End: ?
  Filled 2021-12-23: qty 6, 28d supply, fill #0

## 2021-11-16 ENCOUNTER — Ambulatory Visit
Admission: RE | Admit: 2021-11-16 | Discharge: 2021-11-16 | Disposition: A | Discharge status: Home / Self Care | Payer: BC Managed Care – PPO | Source: Ambulatory Visit | Attending: Internal Medicine | Admitting: Internal Medicine

## 2021-11-16 DIAGNOSIS — Z1231 Encounter for screening mammogram for malignant neoplasm of breast: Secondary | ICD-10-CM

## 2021-11-21 ENCOUNTER — Other Ambulatory Visit: Payer: Self-pay | Admitting: *Deleted

## 2021-11-21 DIAGNOSIS — C9110 Chronic lymphocytic leukemia of B-cell type not having achieved remission: Secondary | ICD-10-CM

## 2021-11-21 NOTE — Progress Notes (Signed)
Dr. Oretha Ellis wants her to have CM7 this week to assess her K+ level. She had taking 40 meq daily x 3 days last week. Currently not on KCl. Scheduled for lab tomorrow. ?

## 2021-11-22 ENCOUNTER — Inpatient Hospital Stay: Payer: BC Managed Care – PPO | Attending: Oncology

## 2021-11-22 DIAGNOSIS — C9110 Chronic lymphocytic leukemia of B-cell type not having achieved remission: Secondary | ICD-10-CM | POA: Diagnosis not present

## 2021-11-22 DIAGNOSIS — D619 Aplastic anemia, unspecified: Secondary | ICD-10-CM | POA: Diagnosis not present

## 2021-11-22 LAB — BASIC METABOLIC PANEL - CANCER CENTER ONLY
Anion gap: 9 (ref 5–15)
BUN: 51 mg/dL — ABNORMAL HIGH (ref 8–23)
CO2: 22 mmol/L (ref 22–32)
Calcium: 9.7 mg/dL (ref 8.9–10.3)
Chloride: 109 mmol/L (ref 98–111)
Creatinine: 2.35 mg/dL — ABNORMAL HIGH (ref 0.44–1.00)
GFR, Estimated: 23 mL/min — ABNORMAL LOW (ref >60)
Glucose, Bld: 125 mg/dL — ABNORMAL HIGH (ref 70–99)
Potassium: 4.8 mmol/L (ref 3.5–5.1)
Sodium: 140 mmol/L (ref 135–145)

## 2021-11-23 ENCOUNTER — Telehealth: Payer: Self-pay | Admitting: *Deleted

## 2021-11-23 NOTE — Telephone Encounter (Signed)
Dr. Benay Spice reviewed her BMP of 11/22/21: K+ is high normal--stay off KCl for now. Creatinine is elevated--need to push po fluids. Results routed to Dr. Oretha Ellis at [facility] ?[place] notified. ?

## 2021-11-25 DIAGNOSIS — Z9484 Stem cells transplant status: Principal | ICD-10-CM

## 2021-11-29 DIAGNOSIS — R161 Splenomegaly, not elsewhere classified: Secondary | ICD-10-CM | POA: Diagnosis not present

## 2021-11-29 DIAGNOSIS — D709 Neutropenia, unspecified: Secondary | ICD-10-CM | POA: Diagnosis not present

## 2021-11-29 DIAGNOSIS — D849 Immunodeficiency, unspecified: Secondary | ICD-10-CM | POA: Diagnosis not present

## 2021-11-29 DIAGNOSIS — R5081 Fever presenting with conditions classified elsewhere: Secondary | ICD-10-CM | POA: Diagnosis not present

## 2021-11-29 DIAGNOSIS — I161 Hypertensive emergency: Secondary | ICD-10-CM | POA: Diagnosis not present

## 2021-11-29 DIAGNOSIS — Z9911 Dependence on respirator [ventilator] status: Secondary | ICD-10-CM | POA: Diagnosis not present

## 2021-11-29 DIAGNOSIS — N183 Chronic kidney disease, stage 3 unspecified: Secondary | ICD-10-CM | POA: Diagnosis not present

## 2021-11-29 DIAGNOSIS — G40901 Epilepsy, unspecified, not intractable, with status epilepticus: Secondary | ICD-10-CM | POA: Diagnosis not present

## 2021-11-29 DIAGNOSIS — E872 Acidosis, unspecified: Secondary | ICD-10-CM | POA: Diagnosis not present

## 2021-11-29 DIAGNOSIS — K5900 Constipation, unspecified: Secondary | ICD-10-CM | POA: Diagnosis not present

## 2021-11-29 DIAGNOSIS — Z4682 Encounter for fitting and adjustment of non-vascular catheter: Secondary | ICD-10-CM | POA: Diagnosis not present

## 2021-11-29 DIAGNOSIS — Z9484 Stem cells transplant status: Secondary | ICD-10-CM | POA: Diagnosis not present

## 2021-11-29 DIAGNOSIS — F419 Anxiety disorder, unspecified: Secondary | ICD-10-CM | POA: Diagnosis not present

## 2021-11-29 DIAGNOSIS — R0602 Shortness of breath: Secondary | ICD-10-CM | POA: Diagnosis not present

## 2021-11-29 DIAGNOSIS — C9111 Chronic lymphocytic leukemia of B-cell type in remission: Secondary | ICD-10-CM | POA: Diagnosis not present

## 2021-11-29 DIAGNOSIS — N189 Chronic kidney disease, unspecified: Secondary | ICD-10-CM | POA: Diagnosis not present

## 2021-11-29 DIAGNOSIS — Z20822 Contact with and (suspected) exposure to covid-19: Secondary | ICD-10-CM | POA: Diagnosis not present

## 2021-11-29 DIAGNOSIS — I10 Essential (primary) hypertension: Secondary | ICD-10-CM | POA: Diagnosis not present

## 2021-11-29 DIAGNOSIS — C9110 Chronic lymphocytic leukemia of B-cell type not having achieved remission: Secondary | ICD-10-CM | POA: Diagnosis not present

## 2021-11-29 DIAGNOSIS — E039 Hypothyroidism, unspecified: Secondary | ICD-10-CM | POA: Diagnosis not present

## 2021-11-29 DIAGNOSIS — J9602 Acute respiratory failure with hypercapnia: Secondary | ICD-10-CM | POA: Diagnosis not present

## 2021-11-29 DIAGNOSIS — Z452 Encounter for adjustment and management of vascular access device: Secondary | ICD-10-CM | POA: Diagnosis not present

## 2021-11-29 DIAGNOSIS — I6783 Posterior reversible encephalopathy syndrome: Secondary | ICD-10-CM | POA: Diagnosis not present

## 2021-11-29 DIAGNOSIS — Z681 Body mass index (BMI) 19 or less, adult: Secondary | ICD-10-CM | POA: Diagnosis not present

## 2021-11-29 DIAGNOSIS — F319 Bipolar disorder, unspecified: Secondary | ICD-10-CM | POA: Diagnosis not present

## 2021-11-29 DIAGNOSIS — G44209 Tension-type headache, unspecified, not intractable: Secondary | ICD-10-CM | POA: Diagnosis not present

## 2021-11-29 DIAGNOSIS — D84821 Immunodeficiency due to drugs: Secondary | ICD-10-CM | POA: Diagnosis not present

## 2021-11-29 DIAGNOSIS — R918 Other nonspecific abnormal finding of lung field: Secondary | ICD-10-CM | POA: Diagnosis not present

## 2021-11-29 DIAGNOSIS — R519 Headache, unspecified: Secondary | ICD-10-CM | POA: Diagnosis not present

## 2021-11-29 DIAGNOSIS — R569 Unspecified convulsions: Secondary | ICD-10-CM | POA: Diagnosis not present

## 2021-11-29 DIAGNOSIS — D619 Aplastic anemia, unspecified: Secondary | ICD-10-CM | POA: Diagnosis not present

## 2021-11-29 DIAGNOSIS — N1832 Chronic kidney disease, stage 3b: Secondary | ICD-10-CM | POA: Diagnosis not present

## 2021-11-29 DIAGNOSIS — D61818 Other pancytopenia: Secondary | ICD-10-CM | POA: Diagnosis not present

## 2021-11-29 DIAGNOSIS — D701 Agranulocytosis secondary to cancer chemotherapy: Secondary | ICD-10-CM | POA: Diagnosis not present

## 2021-11-29 DIAGNOSIS — Z9481 Bone marrow transplant status: Principal | ICD-10-CM

## 2021-11-30 ENCOUNTER — Ambulatory Visit
Admit: 2021-11-30 | Discharge: 2021-12-04 | Disposition: A | Discharge status: Home / Self Care | Payer: PRIVATE HEALTH INSURANCE | Admitting: Student in an Organized Health Care Education/Training Program

## 2021-11-30 ENCOUNTER — Ambulatory Visit: Admit: 2021-11-30 | Payer: PRIVATE HEALTH INSURANCE

## 2021-11-30 ENCOUNTER — Ambulatory Visit: Admit: 2021-11-30 | Discharge: 2021-12-04 | Payer: PRIVATE HEALTH INSURANCE

## 2021-11-30 ENCOUNTER — Ambulatory Visit: Admit: 2021-11-30 | Payer: PRIVATE HEALTH INSURANCE | Attending: Infectious Disease | Primary: Infectious Disease

## 2021-11-30 ENCOUNTER — Encounter
Admit: 2021-11-30 | Discharge: 2021-12-04 | Disposition: A | Discharge status: Home / Self Care | Payer: PRIVATE HEALTH INSURANCE | Admitting: Student in an Organized Health Care Education/Training Program

## 2021-12-03 MED ORDER — CARVEDILOL 6.25 MG TABLET
ORAL_TABLET | Freq: Two times a day (BID) | ORAL | 3 refills | 30 days
Start: 2021-12-03 — End: 2022-02-01

## 2021-12-04 MED ORDER — POSACONAZOLE 100 MG TABLET,DELAYED RELEASE
ORAL_TABLET | Freq: Every day | ORAL | 0 refills | 30 days | Status: CP
Start: 2021-12-04 — End: ?
  Filled 2021-12-04: qty 90, 30d supply, fill #0

## 2021-12-04 MED ORDER — LACOSAMIDE 100 MG TABLET
ORAL_TABLET | Freq: Two times a day (BID) | ORAL | 1 refills | 30 days | Status: CP
Start: 2021-12-04 — End: ?
  Filled 2021-12-04: qty 60, 30d supply, fill #0

## 2021-12-04 MED ORDER — VALACYCLOVIR 500 MG TABLET
ORAL_TABLET | ORAL | 11 refills | 60 days | Status: CP
Start: 2021-12-04 — End: ?

## 2021-12-04 MED ORDER — AMLODIPINE 10 MG TABLET
ORAL_TABLET | Freq: Every day | ORAL | 2 refills | 30.00000 days | Status: CP
Start: 2021-12-04 — End: 2022-12-04
  Filled 2021-12-04: qty 30, 30d supply, fill #0

## 2021-12-04 MED ORDER — TACROLIMUS 0.5 MG CAPSULE, IMMEDIATE-RELEASE
ORAL_CAPSULE | Freq: Two times a day (BID) | ORAL | 1 refills | 30 days | Status: CP
Start: 2021-12-04 — End: ?
  Filled 2021-12-04: qty 60, 30d supply, fill #0

## 2021-12-04 MED ORDER — LEVETIRACETAM 500 MG TABLET
ORAL_TABLET | Freq: Two times a day (BID) | ORAL | 2 refills | 30 days | Status: CP
Start: 2021-12-04 — End: ?
  Filled 2021-12-04: qty 60, 30d supply, fill #0

## 2021-12-04 MED ORDER — CARVEDILOL 6.25 MG TABLET
ORAL_TABLET | Freq: Two times a day (BID) | ORAL | 2 refills | 30 days | Status: CP
Start: 2021-12-04 — End: ?
  Filled 2021-12-04: qty 60, 30d supply, fill #0

## 2021-12-05 DIAGNOSIS — R569 Unspecified convulsions: Principal | ICD-10-CM

## 2021-12-07 ENCOUNTER — Encounter: Admit: 2021-12-07 | Discharge: 2021-12-07 | Payer: PRIVATE HEALTH INSURANCE

## 2021-12-07 ENCOUNTER — Ambulatory Visit
Admit: 2021-12-07 | Discharge: 2021-12-07 | Payer: PRIVATE HEALTH INSURANCE | Attending: Primary Care | Primary: Primary Care

## 2021-12-07 ENCOUNTER — Other Ambulatory Visit: Admit: 2021-12-07 | Discharge: 2021-12-07 | Payer: PRIVATE HEALTH INSURANCE

## 2021-12-07 ENCOUNTER — Ambulatory Visit
Admit: 2021-12-07 | Discharge: 2021-12-08 | Payer: PRIVATE HEALTH INSURANCE | Attending: Pharmacist Clinician (PhC)/ Clinical Pharmacy Specialist | Primary: Pharmacist Clinician (PhC)/ Clinical Pharmacy Specialist

## 2021-12-07 DIAGNOSIS — E039 Hypothyroidism, unspecified: Secondary | ICD-10-CM | POA: Diagnosis not present

## 2021-12-07 DIAGNOSIS — D619 Aplastic anemia, unspecified: Secondary | ICD-10-CM | POA: Diagnosis not present

## 2021-12-07 DIAGNOSIS — D801 Nonfamilial hypogammaglobulinemia: Secondary | ICD-10-CM | POA: Diagnosis not present

## 2021-12-07 DIAGNOSIS — B1910 Unspecified viral hepatitis B without hepatic coma: Secondary | ICD-10-CM | POA: Diagnosis not present

## 2021-12-07 DIAGNOSIS — Z9221 Personal history of antineoplastic chemotherapy: Secondary | ICD-10-CM | POA: Diagnosis not present

## 2021-12-07 DIAGNOSIS — I10 Essential (primary) hypertension: Secondary | ICD-10-CM | POA: Diagnosis not present

## 2021-12-07 DIAGNOSIS — F319 Bipolar disorder, unspecified: Secondary | ICD-10-CM | POA: Diagnosis not present

## 2021-12-07 DIAGNOSIS — J189 Pneumonia, unspecified organism: Secondary | ICD-10-CM | POA: Diagnosis not present

## 2021-12-07 DIAGNOSIS — N289 Disorder of kidney and ureter, unspecified: Secondary | ICD-10-CM | POA: Diagnosis not present

## 2021-12-07 DIAGNOSIS — Z8669 Personal history of other diseases of the nervous system and sense organs: Secondary | ICD-10-CM | POA: Diagnosis not present

## 2021-12-07 DIAGNOSIS — C9111 Chronic lymphocytic leukemia of B-cell type in remission: Secondary | ICD-10-CM | POA: Diagnosis not present

## 2021-12-07 DIAGNOSIS — Z9484 Stem cells transplant status: Secondary | ICD-10-CM | POA: Diagnosis not present

## 2021-12-07 DIAGNOSIS — C9110 Chronic lymphocytic leukemia of B-cell type not having achieved remission: Principal | ICD-10-CM

## 2021-12-07 DIAGNOSIS — Z9481 Bone marrow transplant status: Principal | ICD-10-CM

## 2021-12-15 ENCOUNTER — Ambulatory Visit: Admit: 2021-12-15 | Discharge: 2021-12-16 | Payer: PRIVATE HEALTH INSURANCE

## 2021-12-15 DIAGNOSIS — R569 Unspecified convulsions: Secondary | ICD-10-CM | POA: Diagnosis not present

## 2021-12-15 DIAGNOSIS — C9110 Chronic lymphocytic leukemia of B-cell type not having achieved remission: Secondary | ICD-10-CM | POA: Diagnosis not present

## 2021-12-16 DIAGNOSIS — Z01419 Encounter for gynecological examination (general) (routine) without abnormal findings: Secondary | ICD-10-CM | POA: Diagnosis not present

## 2021-12-16 DIAGNOSIS — Z1151 Encounter for screening for human papillomavirus (HPV): Secondary | ICD-10-CM | POA: Diagnosis not present

## 2021-12-16 DIAGNOSIS — Z124 Encounter for screening for malignant neoplasm of cervix: Secondary | ICD-10-CM | POA: Diagnosis not present

## 2021-12-16 DIAGNOSIS — Z681 Body mass index (BMI) 19 or less, adult: Secondary | ICD-10-CM | POA: Diagnosis not present

## 2021-12-21 ENCOUNTER — Ambulatory Visit
Admit: 2021-12-21 | Discharge: 2021-12-21 | Payer: PRIVATE HEALTH INSURANCE | Attending: Primary Care | Primary: Primary Care

## 2021-12-21 ENCOUNTER — Ambulatory Visit: Admit: 2021-12-21 | Discharge: 2021-12-21 | Payer: PRIVATE HEALTH INSURANCE

## 2021-12-21 ENCOUNTER — Other Ambulatory Visit: Admit: 2021-12-21 | Discharge: 2021-12-21 | Payer: PRIVATE HEALTH INSURANCE

## 2021-12-21 ENCOUNTER — Ambulatory Visit
Admit: 2021-12-21 | Discharge: 2021-12-23 | Disposition: A | Discharge status: Home / Self Care | Payer: PRIVATE HEALTH INSURANCE | Source: Ambulatory Visit

## 2021-12-21 DIAGNOSIS — Z803 Family history of malignant neoplasm of breast: Secondary | ICD-10-CM | POA: Diagnosis not present

## 2021-12-21 DIAGNOSIS — Z9484 Stem cells transplant status: Secondary | ICD-10-CM | POA: Diagnosis not present

## 2021-12-21 DIAGNOSIS — E875 Hyperkalemia: Secondary | ICD-10-CM | POA: Diagnosis not present

## 2021-12-21 DIAGNOSIS — C9111 Chronic lymphocytic leukemia of B-cell type in remission: Secondary | ICD-10-CM | POA: Diagnosis not present

## 2021-12-21 DIAGNOSIS — E872 Acidosis, unspecified: Secondary | ICD-10-CM | POA: Diagnosis not present

## 2021-12-21 DIAGNOSIS — F319 Bipolar disorder, unspecified: Secondary | ICD-10-CM | POA: Diagnosis not present

## 2021-12-21 DIAGNOSIS — I10 Essential (primary) hypertension: Secondary | ICD-10-CM | POA: Diagnosis not present

## 2021-12-21 DIAGNOSIS — N179 Acute kidney failure, unspecified: Secondary | ICD-10-CM | POA: Diagnosis not present

## 2021-12-21 DIAGNOSIS — N183 Chronic kidney disease, stage 3 unspecified: Secondary | ICD-10-CM | POA: Diagnosis not present

## 2021-12-21 DIAGNOSIS — D84821 Immunodeficiency due to drugs: Secondary | ICD-10-CM | POA: Diagnosis not present

## 2021-12-21 DIAGNOSIS — E039 Hypothyroidism, unspecified: Secondary | ICD-10-CM | POA: Diagnosis not present

## 2021-12-21 DIAGNOSIS — E2749 Other adrenocortical insufficiency: Secondary | ICD-10-CM | POA: Diagnosis not present

## 2021-12-21 DIAGNOSIS — I440 Atrioventricular block, first degree: Secondary | ICD-10-CM | POA: Diagnosis not present

## 2021-12-21 DIAGNOSIS — K5900 Constipation, unspecified: Secondary | ICD-10-CM | POA: Diagnosis not present

## 2021-12-21 DIAGNOSIS — D61818 Other pancytopenia: Secondary | ICD-10-CM | POA: Diagnosis not present

## 2021-12-21 DIAGNOSIS — E860 Dehydration: Secondary | ICD-10-CM | POA: Diagnosis not present

## 2021-12-21 DIAGNOSIS — C9112 Chronic lymphocytic leukemia of B-cell type in relapse: Secondary | ICD-10-CM | POA: Diagnosis not present

## 2021-12-21 DIAGNOSIS — E8729 Other acidosis: Secondary | ICD-10-CM | POA: Diagnosis not present

## 2021-12-21 DIAGNOSIS — I129 Hypertensive chronic kidney disease with stage 1 through stage 4 chronic kidney disease, or unspecified chronic kidney disease: Secondary | ICD-10-CM | POA: Diagnosis not present

## 2021-12-21 DIAGNOSIS — N2589 Other disorders resulting from impaired renal tubular function: Secondary | ICD-10-CM | POA: Diagnosis not present

## 2021-12-21 DIAGNOSIS — Z9481 Bone marrow transplant status: Principal | ICD-10-CM

## 2021-12-23 DIAGNOSIS — Z9484 Stem cells transplant status: Principal | ICD-10-CM

## 2021-12-24 MED ORDER — CHLORTHALIDONE 25 MG TABLET
ORAL_TABLET | Freq: Every day | ORAL | 5 refills | 30 days | Status: CP
Start: 2021-12-24 — End: ?
  Filled 2021-12-23: qty 30, 30d supply, fill #0

## 2021-12-26 ENCOUNTER — Ambulatory Visit: Admit: 2021-12-26 | Discharge: 2021-12-27 | Payer: PRIVATE HEALTH INSURANCE

## 2021-12-26 ENCOUNTER — Other Ambulatory Visit: Admit: 2021-12-26 | Discharge: 2021-12-27 | Payer: PRIVATE HEALTH INSURANCE

## 2021-12-26 DIAGNOSIS — Z79899 Other long term (current) drug therapy: Secondary | ICD-10-CM | POA: Diagnosis not present

## 2021-12-26 DIAGNOSIS — Z9484 Stem cells transplant status: Secondary | ICD-10-CM | POA: Diagnosis not present

## 2021-12-26 DIAGNOSIS — C9110 Chronic lymphocytic leukemia of B-cell type not having achieved remission: Secondary | ICD-10-CM | POA: Diagnosis not present

## 2021-12-26 DIAGNOSIS — D849 Immunodeficiency, unspecified: Secondary | ICD-10-CM | POA: Diagnosis not present

## 2021-12-26 DIAGNOSIS — C9111 Chronic lymphocytic leukemia of B-cell type in remission: Secondary | ICD-10-CM | POA: Diagnosis not present

## 2021-12-26 DIAGNOSIS — D84821 Immunodeficiency due to drugs: Secondary | ICD-10-CM | POA: Diagnosis not present

## 2021-12-26 DIAGNOSIS — D619 Aplastic anemia, unspecified: Secondary | ICD-10-CM | POA: Diagnosis not present

## 2021-12-26 DIAGNOSIS — E875 Hyperkalemia: Principal | ICD-10-CM

## 2021-12-26 DIAGNOSIS — N189 Chronic kidney disease, unspecified: Principal | ICD-10-CM

## 2021-12-26 DIAGNOSIS — Z7682 Awaiting organ transplant status: Principal | ICD-10-CM

## 2021-12-26 MED ORDER — FLUDROCORTISONE 0.1 MG TABLET
ORAL_TABLET | ORAL | 5 refills | 28 days | Status: CP
Start: 2021-12-26 — End: ?

## 2021-12-29 ENCOUNTER — Ambulatory Visit: Admit: 2021-12-29 | Discharge: 2021-12-30 | Payer: PRIVATE HEALTH INSURANCE

## 2021-12-29 ENCOUNTER — Other Ambulatory Visit: Admit: 2021-12-29 | Discharge: 2021-12-30 | Payer: PRIVATE HEALTH INSURANCE

## 2021-12-29 DIAGNOSIS — Z9481 Bone marrow transplant status: Secondary | ICD-10-CM | POA: Diagnosis not present

## 2021-12-29 DIAGNOSIS — Z9484 Stem cells transplant status: Secondary | ICD-10-CM | POA: Diagnosis not present

## 2021-12-29 DIAGNOSIS — D619 Aplastic anemia, unspecified: Secondary | ICD-10-CM | POA: Diagnosis not present

## 2021-12-29 DIAGNOSIS — D849 Immunodeficiency, unspecified: Secondary | ICD-10-CM | POA: Diagnosis not present

## 2021-12-29 DIAGNOSIS — C9111 Chronic lymphocytic leukemia of B-cell type in remission: Secondary | ICD-10-CM | POA: Diagnosis not present

## 2021-12-29 DIAGNOSIS — N189 Chronic kidney disease, unspecified: Principal | ICD-10-CM

## 2021-12-29 DIAGNOSIS — E875 Hyperkalemia: Principal | ICD-10-CM

## 2022-01-02 ENCOUNTER — Other Ambulatory Visit: Admit: 2022-01-02 | Discharge: 2022-01-03 | Payer: PRIVATE HEALTH INSURANCE

## 2022-01-02 ENCOUNTER — Ambulatory Visit: Admit: 2022-01-02 | Discharge: 2022-01-03 | Payer: PRIVATE HEALTH INSURANCE

## 2022-01-02 DIAGNOSIS — Z9484 Stem cells transplant status: Secondary | ICD-10-CM | POA: Diagnosis not present

## 2022-01-02 DIAGNOSIS — Z9481 Bone marrow transplant status: Secondary | ICD-10-CM | POA: Diagnosis not present

## 2022-01-04 ENCOUNTER — Ambulatory Visit
Admit: 2022-01-04 | Discharge: 2022-01-04 | Payer: PRIVATE HEALTH INSURANCE | Attending: Infectious Disease | Primary: Infectious Disease

## 2022-01-04 ENCOUNTER — Ambulatory Visit: Admit: 2022-01-04 | Discharge: 2022-01-04 | Payer: PRIVATE HEALTH INSURANCE

## 2022-01-04 ENCOUNTER — Other Ambulatory Visit: Admit: 2022-01-04 | Discharge: 2022-01-04 | Payer: PRIVATE HEALTH INSURANCE

## 2022-01-04 ENCOUNTER — Telehealth: Payer: Self-pay

## 2022-01-04 ENCOUNTER — Other Ambulatory Visit: Payer: Self-pay

## 2022-01-04 DIAGNOSIS — Z9484 Stem cells transplant status: Secondary | ICD-10-CM | POA: Diagnosis not present

## 2022-01-04 DIAGNOSIS — B49 Unspecified mycosis: Secondary | ICD-10-CM | POA: Diagnosis not present

## 2022-01-04 DIAGNOSIS — C9110 Chronic lymphocytic leukemia of B-cell type not having achieved remission: Secondary | ICD-10-CM

## 2022-01-04 DIAGNOSIS — Z79890 Hormone replacement therapy (postmenopausal): Secondary | ICD-10-CM | POA: Diagnosis not present

## 2022-01-04 DIAGNOSIS — C9111 Chronic lymphocytic leukemia of B-cell type in remission: Secondary | ICD-10-CM | POA: Diagnosis not present

## 2022-01-04 DIAGNOSIS — Z79899 Other long term (current) drug therapy: Secondary | ICD-10-CM | POA: Diagnosis not present

## 2022-01-04 DIAGNOSIS — Z9481 Bone marrow transplant status: Secondary | ICD-10-CM | POA: Diagnosis not present

## 2022-01-04 DIAGNOSIS — J168 Pneumonia due to other specified infectious organisms: Secondary | ICD-10-CM | POA: Diagnosis not present

## 2022-01-04 MED ORDER — SODIUM ZIRCONIUM CYCLOSILICATE 10 GRAM ORAL POWDER PACKET
PACK | Freq: Every day | ORAL | 0 refills | 14 days | Status: CP
Start: 2022-01-04 — End: ?

## 2022-01-04 MED FILL — LOKELMA 10 GRAM ORAL POWDER PACKET: ORAL | 14 days supply | Qty: 14 | Fill #0

## 2022-01-04 NOTE — Telephone Encounter (Signed)
Dr Algernon Huxley is requesting Pt do a CMP to check Potassium level and liver functions. Orders put in and appointment made for Friday.

## 2022-01-06 ENCOUNTER — Inpatient Hospital Stay: Payer: BC Managed Care – PPO | Attending: Oncology

## 2022-01-06 ENCOUNTER — Other Ambulatory Visit: Payer: Self-pay

## 2022-01-06 DIAGNOSIS — D619 Aplastic anemia, unspecified: Secondary | ICD-10-CM | POA: Insufficient documentation

## 2022-01-06 DIAGNOSIS — C9110 Chronic lymphocytic leukemia of B-cell type not having achieved remission: Secondary | ICD-10-CM

## 2022-01-06 LAB — CMP (CANCER CENTER ONLY)
ALT: 21 U/L (ref 0–44)
AST: 20 U/L (ref 15–41)
Albumin: 4.3 g/dL (ref 3.5–5.0)
Alkaline Phosphatase: 62 U/L (ref 38–126)
Anion gap: 8 (ref 5–15)
BUN: 60 mg/dL — ABNORMAL HIGH (ref 8–23)
CO2: 19 mmol/L — ABNORMAL LOW (ref 22–32)
Calcium: 9.8 mg/dL (ref 8.9–10.3)
Chloride: 107 mmol/L (ref 98–111)
Creatinine: 2.66 mg/dL — ABNORMAL HIGH (ref 0.44–1.00)
GFR, Estimated: 20 mL/min — ABNORMAL LOW (ref >60)
Glucose, Bld: 122 mg/dL — ABNORMAL HIGH (ref 70–99)
Potassium: 4.5 mmol/L (ref 3.5–5.1)
Sodium: 134 mmol/L — ABNORMAL LOW (ref 135–145)
Total Bilirubin: 0.3 mg/dL (ref 0.3–1.2)
Total Protein: 7.8 g/dL (ref 6.5–8.1)

## 2022-01-06 MED ORDER — TACROLIMUS 0.5 MG CAPSULE, IMMEDIATE-RELEASE
ORAL_CAPSULE | ORAL | 1 refills | 30 days | Status: CP
Start: 2022-01-06 — End: ?

## 2022-01-09 ENCOUNTER — Telehealth: Payer: Self-pay

## 2022-01-09 NOTE — Telephone Encounter (Signed)
-----   Message from Ladell Pier, MD sent at 01/06/2022  4:26 PM EDT ----- Send the chemistry panel result to her physician at [facility]

## 2022-01-09 NOTE — Telephone Encounter (Signed)
Labs faxed to [facility] will follow up call tomorrow to confirm received.

## 2022-01-11 ENCOUNTER — Other Ambulatory Visit: Admit: 2022-01-11 | Discharge: 2022-01-11 | Payer: PRIVATE HEALTH INSURANCE

## 2022-01-11 ENCOUNTER — Ambulatory Visit: Admit: 2022-01-11 | Discharge: 2022-01-11 | Payer: PRIVATE HEALTH INSURANCE

## 2022-01-11 ENCOUNTER — Ambulatory Visit
Admit: 2022-01-11 | Discharge: 2022-01-11 | Payer: PRIVATE HEALTH INSURANCE | Attending: Primary Care | Primary: Primary Care

## 2022-01-11 DIAGNOSIS — E875 Hyperkalemia: Secondary | ICD-10-CM | POA: Diagnosis not present

## 2022-01-11 DIAGNOSIS — N179 Acute kidney failure, unspecified: Secondary | ICD-10-CM | POA: Diagnosis not present

## 2022-01-11 DIAGNOSIS — G4700 Insomnia, unspecified: Secondary | ICD-10-CM | POA: Diagnosis not present

## 2022-01-11 DIAGNOSIS — D61818 Other pancytopenia: Secondary | ICD-10-CM | POA: Diagnosis not present

## 2022-01-11 DIAGNOSIS — E039 Hypothyroidism, unspecified: Secondary | ICD-10-CM | POA: Diagnosis not present

## 2022-01-11 DIAGNOSIS — D849 Immunodeficiency, unspecified: Secondary | ICD-10-CM | POA: Diagnosis not present

## 2022-01-11 DIAGNOSIS — Z9484 Stem cells transplant status: Secondary | ICD-10-CM | POA: Diagnosis not present

## 2022-01-11 DIAGNOSIS — Z79899 Other long term (current) drug therapy: Secondary | ICD-10-CM | POA: Diagnosis not present

## 2022-01-11 DIAGNOSIS — F319 Bipolar disorder, unspecified: Secondary | ICD-10-CM | POA: Diagnosis not present

## 2022-01-11 DIAGNOSIS — D709 Neutropenia, unspecified: Secondary | ICD-10-CM | POA: Diagnosis not present

## 2022-01-11 DIAGNOSIS — I10 Essential (primary) hypertension: Secondary | ICD-10-CM | POA: Diagnosis not present

## 2022-01-11 DIAGNOSIS — C9111 Chronic lymphocytic leukemia of B-cell type in remission: Secondary | ICD-10-CM | POA: Diagnosis not present

## 2022-01-11 DIAGNOSIS — C9110 Chronic lymphocytic leukemia of B-cell type not having achieved remission: Secondary | ICD-10-CM | POA: Diagnosis not present

## 2022-01-11 DIAGNOSIS — N184 Chronic kidney disease, stage 4 (severe): Principal | ICD-10-CM

## 2022-01-11 DIAGNOSIS — Z9481 Bone marrow transplant status: Principal | ICD-10-CM

## 2022-01-11 DIAGNOSIS — Z87898 Personal history of other specified conditions: Principal | ICD-10-CM

## 2022-01-15 DIAGNOSIS — C9110 Chronic lymphocytic leukemia of B-cell type not having achieved remission: Secondary | ICD-10-CM | POA: Diagnosis not present

## 2022-01-18 ENCOUNTER — Ambulatory Visit
Admit: 2022-01-18 | Discharge: 2022-01-19 | Payer: PRIVATE HEALTH INSURANCE | Attending: Primary Care | Primary: Primary Care

## 2022-01-18 ENCOUNTER — Other Ambulatory Visit: Admit: 2022-01-18 | Discharge: 2022-01-19 | Payer: PRIVATE HEALTH INSURANCE

## 2022-01-18 ENCOUNTER — Ambulatory Visit: Admit: 2022-01-18 | Discharge: 2022-01-19 | Payer: PRIVATE HEALTH INSURANCE

## 2022-01-18 DIAGNOSIS — R519 Headache, unspecified: Secondary | ICD-10-CM | POA: Diagnosis not present

## 2022-01-18 DIAGNOSIS — D61818 Other pancytopenia: Secondary | ICD-10-CM | POA: Diagnosis not present

## 2022-01-18 DIAGNOSIS — J90 Pleural effusion, not elsewhere classified: Secondary | ICD-10-CM | POA: Diagnosis not present

## 2022-01-18 DIAGNOSIS — N183 Stage 3 chronic kidney disease, unspecified whether stage 3a or 3b CKD (CMS-HCC): Principal | ICD-10-CM

## 2022-01-18 DIAGNOSIS — C9111 Chronic lymphocytic leukemia of B-cell type in remission: Secondary | ICD-10-CM | POA: Diagnosis not present

## 2022-01-18 DIAGNOSIS — R7881 Bacteremia: Secondary | ICD-10-CM | POA: Diagnosis not present

## 2022-01-18 DIAGNOSIS — B965 Pseudomonas (aeruginosa) (mallei) (pseudomallei) as the cause of diseases classified elsewhere: Secondary | ICD-10-CM | POA: Diagnosis not present

## 2022-01-18 DIAGNOSIS — C9110 Chronic lymphocytic leukemia of B-cell type not having achieved remission: Secondary | ICD-10-CM | POA: Diagnosis not present

## 2022-01-18 DIAGNOSIS — I10 Essential (primary) hypertension: Secondary | ICD-10-CM | POA: Diagnosis not present

## 2022-01-18 DIAGNOSIS — D84822 Immunocompromised state associated with stem cell transplant (CMS-HCC): Principal | ICD-10-CM

## 2022-01-18 DIAGNOSIS — K1230 Oral mucositis (ulcerative), unspecified: Secondary | ICD-10-CM | POA: Diagnosis not present

## 2022-01-18 DIAGNOSIS — Z931 Gastrostomy status: Secondary | ICD-10-CM | POA: Diagnosis not present

## 2022-01-18 DIAGNOSIS — Z9484 Stem cells transplant status: Secondary | ICD-10-CM | POA: Diagnosis not present

## 2022-01-18 DIAGNOSIS — D649 Anemia, unspecified: Principal | ICD-10-CM

## 2022-01-18 DIAGNOSIS — Z9481 Bone marrow transplant status: Principal | ICD-10-CM

## 2022-01-18 DIAGNOSIS — D696 Thrombocytopenia, unspecified: Principal | ICD-10-CM

## 2022-01-20 ENCOUNTER — Telehealth: Payer: Self-pay

## 2022-01-20 ENCOUNTER — Other Ambulatory Visit: Payer: Self-pay

## 2022-01-20 DIAGNOSIS — C9110 Chronic lymphocytic leukemia of B-cell type not having achieved remission: Secondary | ICD-10-CM

## 2022-01-20 NOTE — Telephone Encounter (Signed)
TC from Pt stating she needed an appointment to draw labs per Dr Algernon Huxley BMP, CBC with diff, and Tacrolimus level. Results should be faxed to Dr Leotis Pain office at [phone]. Pt's appointment was scheduled on Monday 01/23/22'@10'$ 

## 2022-01-21 MED ORDER — SODIUM ZIRCONIUM CYCLOSILICATE 10 GRAM ORAL POWDER PACKET
PACK | Freq: Every day | ORAL | 0 refills | 14 days
Start: 2022-01-21 — End: ?

## 2022-01-23 ENCOUNTER — Other Ambulatory Visit: Payer: Self-pay

## 2022-01-23 ENCOUNTER — Inpatient Hospital Stay: Payer: BC Managed Care – PPO | Attending: Oncology

## 2022-01-23 ENCOUNTER — Other Ambulatory Visit (HOSPITAL_BASED_OUTPATIENT_CLINIC_OR_DEPARTMENT_OTHER): Payer: Self-pay

## 2022-01-23 DIAGNOSIS — C9110 Chronic lymphocytic leukemia of B-cell type not having achieved remission: Secondary | ICD-10-CM | POA: Insufficient documentation

## 2022-01-23 DIAGNOSIS — D619 Aplastic anemia, unspecified: Secondary | ICD-10-CM | POA: Diagnosis not present

## 2022-01-23 LAB — CBC WITH DIFFERENTIAL (CANCER CENTER ONLY)
Abs Immature Granulocytes: 0.01 10*3/uL (ref 0.00–0.07)
Basophils Absolute: 0 10*3/uL (ref 0.0–0.1)
Basophils Relative: 0 %
Eosinophils Absolute: 0.2 10*3/uL (ref 0.0–0.5)
Eosinophils Relative: 7 %
HCT: 29.4 % — ABNORMAL LOW (ref 36.0–46.0)
Hemoglobin: 9.5 g/dL — ABNORMAL LOW (ref 12.0–15.0)
Immature Granulocytes: 0 %
Lymphocytes Relative: 51 %
Lymphs Abs: 1.6 10*3/uL (ref 0.7–4.0)
MCH: 31.8 pg (ref 26.0–34.0)
MCHC: 32.3 g/dL (ref 30.0–36.0)
MCV: 98.3 fL (ref 80.0–100.0)
Monocytes Absolute: 0.2 10*3/uL (ref 0.1–1.0)
Monocytes Relative: 6 %
Neutro Abs: 1.1 10*3/uL — ABNORMAL LOW (ref 1.7–7.7)
Neutrophils Relative %: 36 %
Platelet Count: 119 10*3/uL — ABNORMAL LOW (ref 150–400)
RBC: 2.99 MIL/uL — ABNORMAL LOW (ref 3.87–5.11)
RDW: 12.5 % (ref 11.5–15.5)
WBC Count: 3.1 10*3/uL — ABNORMAL LOW (ref 4.0–10.5)
nRBC: 0 % (ref 0.0–0.2)

## 2022-01-23 LAB — BASIC METABOLIC PANEL - CANCER CENTER ONLY
Anion gap: 9 (ref 5–15)
BUN: 49 mg/dL — ABNORMAL HIGH (ref 8–23)
CO2: 23 mmol/L (ref 22–32)
Calcium: 9.8 mg/dL (ref 8.9–10.3)
Chloride: 108 mmol/L (ref 98–111)
Creatinine: 2.42 mg/dL — ABNORMAL HIGH (ref 0.44–1.00)
GFR, Estimated: 22 mL/min — ABNORMAL LOW (ref >60)
Glucose, Bld: 155 mg/dL — ABNORMAL HIGH (ref 70–99)
Potassium: 5.1 mmol/L (ref 3.5–5.1)
Sodium: 140 mmol/L (ref 135–145)

## 2022-01-23 MED ORDER — SODIUM ZIRCONIUM CYCLOSILICATE 10 GRAM ORAL POWDER PACKET
PACK | Freq: Every day | ORAL | 0 refills | 14 days | Status: CP
Start: 2022-01-23 — End: ?

## 2022-01-25 ENCOUNTER — Telehealth: Payer: Self-pay

## 2022-01-25 LAB — TACROLIMUS LEVEL: Tacrolimus (FK506) - LabCorp: 3.8 ng/mL (ref 2.0–20.0)

## 2022-01-25 NOTE — Telephone Encounter (Signed)
Lab work faxed to Dr [employer] office [phone]

## 2022-01-28 MED ORDER — TACROLIMUS 0.5 MG CAPSULE, IMMEDIATE-RELEASE
ORAL_CAPSULE | Freq: Two times a day (BID) | ORAL | 2 refills | 30 days | Status: CP
Start: 2022-01-28 — End: ?

## 2022-01-31 DIAGNOSIS — Z9484 Stem cells transplant status: Principal | ICD-10-CM

## 2022-02-01 ENCOUNTER — Other Ambulatory Visit: Admit: 2022-02-01 | Discharge: 2022-02-02 | Payer: PRIVATE HEALTH INSURANCE

## 2022-02-01 ENCOUNTER — Ambulatory Visit
Admit: 2022-02-01 | Discharge: 2022-02-02 | Payer: PRIVATE HEALTH INSURANCE | Attending: Nurse Practitioner | Primary: Nurse Practitioner

## 2022-02-01 DIAGNOSIS — D801 Nonfamilial hypogammaglobulinemia: Secondary | ICD-10-CM | POA: Diagnosis not present

## 2022-02-01 DIAGNOSIS — K5900 Constipation, unspecified: Secondary | ICD-10-CM | POA: Diagnosis not present

## 2022-02-01 DIAGNOSIS — Z79621 Long term (current) use of calcineurin inhibitor: Secondary | ICD-10-CM | POA: Diagnosis not present

## 2022-02-01 DIAGNOSIS — Z9484 Stem cells transplant status: Secondary | ICD-10-CM | POA: Diagnosis not present

## 2022-02-01 DIAGNOSIS — Z9481 Bone marrow transplant status: Secondary | ICD-10-CM | POA: Diagnosis not present

## 2022-02-01 DIAGNOSIS — E875 Hyperkalemia: Secondary | ICD-10-CM | POA: Diagnosis not present

## 2022-02-01 DIAGNOSIS — D84822 Immunocompromised state associated with stem cell transplant (CMS-HCC): Principal | ICD-10-CM

## 2022-02-01 DIAGNOSIS — D61818 Other pancytopenia: Secondary | ICD-10-CM | POA: Diagnosis not present

## 2022-02-01 DIAGNOSIS — I129 Hypertensive chronic kidney disease with stage 1 through stage 4 chronic kidney disease, or unspecified chronic kidney disease: Secondary | ICD-10-CM | POA: Diagnosis not present

## 2022-02-01 DIAGNOSIS — N183 Chronic kidney disease, stage 3 unspecified: Secondary | ICD-10-CM | POA: Diagnosis not present

## 2022-02-01 DIAGNOSIS — Z79899 Other long term (current) drug therapy: Secondary | ICD-10-CM | POA: Diagnosis not present

## 2022-02-01 DIAGNOSIS — C9110 Chronic lymphocytic leukemia of B-cell type not having achieved remission: Secondary | ICD-10-CM | POA: Diagnosis not present

## 2022-02-01 DIAGNOSIS — Z931 Gastrostomy status: Secondary | ICD-10-CM | POA: Diagnosis not present

## 2022-02-01 DIAGNOSIS — N179 Acute kidney failure, unspecified: Secondary | ICD-10-CM | POA: Diagnosis not present

## 2022-02-01 MED ORDER — LACOSAMIDE 100 MG TABLET
ORAL_TABLET | Freq: Two times a day (BID) | ORAL | 1 refills | 30 days | Status: CP
Start: 2022-02-01 — End: ?

## 2022-02-01 MED ORDER — SODIUM ZIRCONIUM CYCLOSILICATE 10 GRAM ORAL POWDER PACKET
PACK | Freq: Every day | ORAL | 1 refills | 30 days | Status: CP
Start: 2022-02-01 — End: ?

## 2022-02-02 ENCOUNTER — Other Ambulatory Visit: Payer: Self-pay

## 2022-02-02 ENCOUNTER — Telehealth: Payer: Self-pay

## 2022-02-02 DIAGNOSIS — C9110 Chronic lymphocytic leukemia of B-cell type not having achieved remission: Secondary | ICD-10-CM

## 2022-02-02 DIAGNOSIS — Z9484 Stem cells transplant status: Principal | ICD-10-CM

## 2022-02-02 MED ORDER — TACROLIMUS 0.5 MG CAPSULE, IMMEDIATE-RELEASE
ORAL_CAPSULE | ORAL | 2 refills | 30 days | Status: CP
Start: 2022-02-02 — End: ?

## 2022-02-02 NOTE — Telephone Encounter (Signed)
TC from Pt stating she needed a lab appointment. Pt stated she needed a repeat of the previous labs ordered. Went over labs previously ordered. Pt verified these orders and stated Dr at [facility] can see labs in epic.

## 2022-02-08 ENCOUNTER — Inpatient Hospital Stay: Payer: BC Managed Care – PPO

## 2022-02-08 DIAGNOSIS — C9110 Chronic lymphocytic leukemia of B-cell type not having achieved remission: Secondary | ICD-10-CM

## 2022-02-08 DIAGNOSIS — D619 Aplastic anemia, unspecified: Secondary | ICD-10-CM | POA: Diagnosis not present

## 2022-02-08 LAB — CBC WITH DIFFERENTIAL (CANCER CENTER ONLY)
Abs Immature Granulocytes: 0.01 10*3/uL (ref 0.00–0.07)
Basophils Absolute: 0 10*3/uL (ref 0.0–0.1)
Basophils Relative: 0 %
Eosinophils Absolute: 0.2 10*3/uL (ref 0.0–0.5)
Eosinophils Relative: 6 %
HCT: 30.5 % — ABNORMAL LOW (ref 36.0–46.0)
Hemoglobin: 10.1 g/dL — ABNORMAL LOW (ref 12.0–15.0)
Immature Granulocytes: 0 %
Lymphocytes Relative: 60 %
Lymphs Abs: 1.9 10*3/uL (ref 0.7–4.0)
MCH: 32.5 pg (ref 26.0–34.0)
MCHC: 33.1 g/dL (ref 30.0–36.0)
MCV: 98.1 fL (ref 80.0–100.0)
Monocytes Absolute: 0.2 10*3/uL (ref 0.1–1.0)
Monocytes Relative: 6 %
Neutro Abs: 0.9 10*3/uL — ABNORMAL LOW (ref 1.7–7.7)
Neutrophils Relative %: 28 %
Platelet Count: 110 10*3/uL — ABNORMAL LOW (ref 150–400)
RBC: 3.11 MIL/uL — ABNORMAL LOW (ref 3.87–5.11)
RDW: 12.3 % (ref 11.5–15.5)
WBC Count: 3.2 10*3/uL — ABNORMAL LOW (ref 4.0–10.5)
nRBC: 0 % (ref 0.0–0.2)

## 2022-02-08 LAB — BASIC METABOLIC PANEL
Anion gap: 8 (ref 5–15)
BUN: 64 mg/dL — ABNORMAL HIGH (ref 8–23)
CO2: 22 mmol/L (ref 22–32)
Calcium: 9.7 mg/dL (ref 8.9–10.3)
Chloride: 109 mmol/L (ref 98–111)
Creatinine, Ser: 2.43 mg/dL — ABNORMAL HIGH (ref 0.44–1.00)
GFR, Estimated: 22 mL/min — ABNORMAL LOW (ref >60)
Glucose, Bld: 129 mg/dL — ABNORMAL HIGH (ref 70–99)
Potassium: 5 mmol/L (ref 3.5–5.1)
Sodium: 139 mmol/L (ref 135–145)

## 2022-02-10 LAB — TACROLIMUS LEVEL: Tacrolimus (FK506) - LabCorp: 1.5 ng/mL — ABNORMAL LOW (ref 2.0–20.0)

## 2022-02-14 ENCOUNTER — Ambulatory Visit: Admit: 2022-02-14 | Discharge: 2022-02-14 | Payer: PRIVATE HEALTH INSURANCE

## 2022-02-14 ENCOUNTER — Other Ambulatory Visit: Admit: 2022-02-14 | Discharge: 2022-02-14 | Payer: PRIVATE HEALTH INSURANCE

## 2022-02-14 DIAGNOSIS — G4700 Insomnia, unspecified: Secondary | ICD-10-CM | POA: Diagnosis not present

## 2022-02-14 DIAGNOSIS — E875 Hyperkalemia: Secondary | ICD-10-CM | POA: Diagnosis not present

## 2022-02-14 DIAGNOSIS — D84822 Immunocompromised state associated with stem cell transplant (CMS-HCC): Principal | ICD-10-CM

## 2022-02-14 DIAGNOSIS — Z931 Gastrostomy status: Secondary | ICD-10-CM | POA: Diagnosis not present

## 2022-02-14 DIAGNOSIS — Z5181 Encounter for therapeutic drug level monitoring: Secondary | ICD-10-CM | POA: Diagnosis not present

## 2022-02-14 DIAGNOSIS — F319 Bipolar disorder, unspecified: Secondary | ICD-10-CM | POA: Diagnosis not present

## 2022-02-14 DIAGNOSIS — K1230 Oral mucositis (ulcerative), unspecified: Secondary | ICD-10-CM | POA: Diagnosis not present

## 2022-02-14 DIAGNOSIS — E039 Hypothyroidism, unspecified: Secondary | ICD-10-CM | POA: Diagnosis not present

## 2022-02-14 DIAGNOSIS — R4182 Altered mental status, unspecified: Secondary | ICD-10-CM | POA: Diagnosis not present

## 2022-02-14 DIAGNOSIS — Z9481 Bone marrow transplant status: Secondary | ICD-10-CM | POA: Diagnosis not present

## 2022-02-14 DIAGNOSIS — Z79890 Hormone replacement therapy (postmenopausal): Secondary | ICD-10-CM | POA: Diagnosis not present

## 2022-02-14 DIAGNOSIS — Z48298 Encounter for aftercare following other organ transplant: Secondary | ICD-10-CM | POA: Diagnosis not present

## 2022-02-14 DIAGNOSIS — R519 Headache, unspecified: Secondary | ICD-10-CM | POA: Diagnosis not present

## 2022-02-14 DIAGNOSIS — I10 Essential (primary) hypertension: Secondary | ICD-10-CM | POA: Diagnosis not present

## 2022-02-14 DIAGNOSIS — Z79899 Other long term (current) drug therapy: Secondary | ICD-10-CM | POA: Diagnosis not present

## 2022-02-14 DIAGNOSIS — J90 Pleural effusion, not elsewhere classified: Secondary | ICD-10-CM | POA: Diagnosis not present

## 2022-02-14 DIAGNOSIS — D61818 Other pancytopenia: Secondary | ICD-10-CM | POA: Diagnosis not present

## 2022-02-14 DIAGNOSIS — Z9484 Stem cells transplant status: Secondary | ICD-10-CM | POA: Diagnosis not present

## 2022-02-14 DIAGNOSIS — C9110 Chronic lymphocytic leukemia of B-cell type not having achieved remission: Secondary | ICD-10-CM | POA: Diagnosis not present

## 2022-02-20 ENCOUNTER — Ambulatory Visit
Admit: 2022-02-20 | Discharge: 2022-02-21 | Payer: PRIVATE HEALTH INSURANCE | Attending: Student in an Organized Health Care Education/Training Program | Primary: Student in an Organized Health Care Education/Training Program

## 2022-02-20 ENCOUNTER — Other Ambulatory Visit: Payer: Self-pay

## 2022-02-20 DIAGNOSIS — I10 Essential (primary) hypertension: Secondary | ICD-10-CM | POA: Diagnosis not present

## 2022-02-20 DIAGNOSIS — N184 Chronic kidney disease, stage 4 (severe): Secondary | ICD-10-CM | POA: Diagnosis not present

## 2022-02-20 DIAGNOSIS — N2589 Other disorders resulting from impaired renal tubular function: Secondary | ICD-10-CM | POA: Diagnosis not present

## 2022-02-20 DIAGNOSIS — C9110 Chronic lymphocytic leukemia of B-cell type not having achieved remission: Secondary | ICD-10-CM

## 2022-02-20 DIAGNOSIS — E875 Hyperkalemia: Secondary | ICD-10-CM | POA: Diagnosis not present

## 2022-02-22 ENCOUNTER — Other Ambulatory Visit: Payer: Self-pay

## 2022-02-22 ENCOUNTER — Other Ambulatory Visit: Payer: Self-pay | Admitting: *Deleted

## 2022-02-22 DIAGNOSIS — C9110 Chronic lymphocytic leukemia of B-cell type not having achieved remission: Secondary | ICD-10-CM

## 2022-02-22 NOTE — Progress Notes (Signed)
Call from [facility] lab at [facility]. Need to re-enter Tacrolimus level order. Was released in error for today.

## 2022-02-23 ENCOUNTER — Inpatient Hospital Stay: Payer: BC Managed Care – PPO | Attending: Oncology

## 2022-02-23 ENCOUNTER — Encounter: Payer: Self-pay | Admitting: *Deleted

## 2022-02-23 DIAGNOSIS — C9110 Chronic lymphocytic leukemia of B-cell type not having achieved remission: Secondary | ICD-10-CM | POA: Diagnosis not present

## 2022-02-23 DIAGNOSIS — D619 Aplastic anemia, unspecified: Secondary | ICD-10-CM | POA: Diagnosis not present

## 2022-02-23 LAB — CBC WITH DIFFERENTIAL (CANCER CENTER ONLY)
Abs Immature Granulocytes: 0 10*3/uL (ref 0.00–0.07)
Basophils Absolute: 0 10*3/uL (ref 0.0–0.1)
Basophils Relative: 1 %
Eosinophils Absolute: 0.2 10*3/uL (ref 0.0–0.5)
Eosinophils Relative: 7 %
HCT: 32.1 % — ABNORMAL LOW (ref 36.0–46.0)
Hemoglobin: 10.4 g/dL — ABNORMAL LOW (ref 12.0–15.0)
Immature Granulocytes: 0 %
Lymphocytes Relative: 51 %
Lymphs Abs: 1.6 10*3/uL (ref 0.7–4.0)
MCH: 32.2 pg (ref 26.0–34.0)
MCHC: 32.4 g/dL (ref 30.0–36.0)
MCV: 99.4 fL (ref 80.0–100.0)
Monocytes Absolute: 0.2 10*3/uL (ref 0.1–1.0)
Monocytes Relative: 7 %
Neutro Abs: 1.1 10*3/uL — ABNORMAL LOW (ref 1.7–7.7)
Neutrophils Relative %: 34 %
Platelet Count: 103 10*3/uL — ABNORMAL LOW (ref 150–400)
RBC: 3.23 MIL/uL — ABNORMAL LOW (ref 3.87–5.11)
RDW: 12.1 % (ref 11.5–15.5)
WBC Count: 3.2 10*3/uL — ABNORMAL LOW (ref 4.0–10.5)
nRBC: 0 % (ref 0.0–0.2)

## 2022-02-23 LAB — CMP (CANCER CENTER ONLY)
ALT: 36 U/L (ref 0–44)
AST: 32 U/L (ref 15–41)
Albumin: 4.2 g/dL (ref 3.5–5.0)
Alkaline Phosphatase: 73 U/L (ref 38–126)
Anion gap: 8 (ref 5–15)
BUN: 52 mg/dL — ABNORMAL HIGH (ref 8–23)
CO2: 21 mmol/L — ABNORMAL LOW (ref 22–32)
Calcium: 9.6 mg/dL (ref 8.9–10.3)
Chloride: 111 mmol/L (ref 98–111)
Creatinine: 2.08 mg/dL — ABNORMAL HIGH (ref 0.44–1.00)
GFR, Estimated: 26 mL/min — ABNORMAL LOW (ref >60)
Glucose, Bld: 96 mg/dL (ref 70–99)
Potassium: 5.3 mmol/L — ABNORMAL HIGH (ref 3.5–5.1)
Sodium: 140 mmol/L (ref 135–145)
Total Bilirubin: 0.4 mg/dL (ref 0.3–1.2)
Total Protein: 7.1 g/dL (ref 6.5–8.1)

## 2022-02-23 NOTE — Progress Notes (Signed)
CBC and CMP faxed to Dr Leotis Pain office [phone] as well as routed via [place]. Tacrolimus pending

## 2022-02-26 LAB — TACROLIMUS LEVEL: Tacrolimus (FK506) - LabCorp: 1.3 ng/mL — ABNORMAL LOW (ref 2.0–20.0)

## 2022-02-27 ENCOUNTER — Encounter: Payer: Self-pay | Admitting: *Deleted

## 2022-02-27 NOTE — Progress Notes (Signed)
All labs of 8/3/ faxed to Dr Leotis Pain office [phone] as well as routed via [employer]. Tacrolimus pending

## 2022-03-02 ENCOUNTER — Telehealth: Admit: 2022-03-02 | Discharge: 2022-03-03 | Payer: PRIVATE HEALTH INSURANCE

## 2022-03-02 DIAGNOSIS — R569 Unspecified convulsions: Secondary | ICD-10-CM | POA: Diagnosis not present

## 2022-03-06 ENCOUNTER — Other Ambulatory Visit: Admit: 2022-03-06 | Discharge: 2022-03-06 | Payer: PRIVATE HEALTH INSURANCE

## 2022-03-06 ENCOUNTER — Ambulatory Visit: Admit: 2022-03-06 | Discharge: 2022-03-06 | Payer: PRIVATE HEALTH INSURANCE

## 2022-03-06 DIAGNOSIS — R7881 Bacteremia: Secondary | ICD-10-CM | POA: Diagnosis not present

## 2022-03-06 DIAGNOSIS — C9110 Chronic lymphocytic leukemia of B-cell type not having achieved remission: Secondary | ICD-10-CM | POA: Diagnosis not present

## 2022-03-06 DIAGNOSIS — N179 Acute kidney failure, unspecified: Secondary | ICD-10-CM | POA: Diagnosis not present

## 2022-03-06 DIAGNOSIS — D61818 Other pancytopenia: Secondary | ICD-10-CM | POA: Diagnosis not present

## 2022-03-06 DIAGNOSIS — Z931 Gastrostomy status: Secondary | ICD-10-CM | POA: Diagnosis not present

## 2022-03-06 DIAGNOSIS — N184 Chronic kidney disease, stage 4 (severe): Secondary | ICD-10-CM | POA: Diagnosis not present

## 2022-03-06 DIAGNOSIS — Z9484 Stem cells transplant status: Secondary | ICD-10-CM | POA: Diagnosis not present

## 2022-03-06 DIAGNOSIS — Z9481 Bone marrow transplant status: Secondary | ICD-10-CM | POA: Diagnosis not present

## 2022-03-06 DIAGNOSIS — R569 Unspecified convulsions: Secondary | ICD-10-CM | POA: Diagnosis not present

## 2022-03-06 DIAGNOSIS — N2589 Other disorders resulting from impaired renal tubular function: Secondary | ICD-10-CM | POA: Diagnosis not present

## 2022-03-06 DIAGNOSIS — J90 Pleural effusion, not elsewhere classified: Secondary | ICD-10-CM | POA: Diagnosis not present

## 2022-03-06 DIAGNOSIS — K1230 Oral mucositis (ulcerative), unspecified: Secondary | ICD-10-CM | POA: Diagnosis not present

## 2022-03-06 DIAGNOSIS — F325 Major depressive disorder, single episode, in full remission: Secondary | ICD-10-CM | POA: Diagnosis not present

## 2022-03-06 DIAGNOSIS — B965 Pseudomonas (aeruginosa) (mallei) (pseudomallei) as the cause of diseases classified elsewhere: Secondary | ICD-10-CM | POA: Diagnosis not present

## 2022-03-06 DIAGNOSIS — R519 Headache, unspecified: Secondary | ICD-10-CM | POA: Diagnosis not present

## 2022-03-06 DIAGNOSIS — C9111 Chronic lymphocytic leukemia of B-cell type in remission: Secondary | ICD-10-CM | POA: Diagnosis not present

## 2022-03-06 DIAGNOSIS — I129 Hypertensive chronic kidney disease with stage 1 through stage 4 chronic kidney disease, or unspecified chronic kidney disease: Secondary | ICD-10-CM | POA: Diagnosis not present

## 2022-03-06 DIAGNOSIS — D849 Immunodeficiency, unspecified: Principal | ICD-10-CM

## 2022-03-06 DIAGNOSIS — D619 Aplastic anemia, unspecified: Principal | ICD-10-CM

## 2022-03-06 DIAGNOSIS — D702 Other drug-induced agranulocytosis: Principal | ICD-10-CM

## 2022-03-06 MED ORDER — OLANZAPINE 5 MG TABLET
ORAL_TABLET | Freq: Every evening | ORAL | 2 refills | 30 days | Status: CP
Start: 2022-03-06 — End: 2022-03-06

## 2022-03-07 ENCOUNTER — Other Ambulatory Visit: Payer: Self-pay

## 2022-03-07 DIAGNOSIS — C9110 Chronic lymphocytic leukemia of B-cell type not having achieved remission: Secondary | ICD-10-CM

## 2022-03-07 MED ORDER — TACROLIMUS 0.5 MG CAPSULE, IMMEDIATE-RELEASE
ORAL_CAPSULE | Freq: Two times a day (BID) | ORAL | 2 refills | 30 days | Status: CP
Start: 2022-03-07 — End: ?

## 2022-03-09 ENCOUNTER — Other Ambulatory Visit: Payer: Self-pay

## 2022-03-09 DIAGNOSIS — C9110 Chronic lymphocytic leukemia of B-cell type not having achieved remission: Secondary | ICD-10-CM

## 2022-03-10 ENCOUNTER — Inpatient Hospital Stay: Payer: BC Managed Care – PPO

## 2022-03-10 DIAGNOSIS — C9110 Chronic lymphocytic leukemia of B-cell type not having achieved remission: Secondary | ICD-10-CM

## 2022-03-10 DIAGNOSIS — D619 Aplastic anemia, unspecified: Secondary | ICD-10-CM | POA: Diagnosis not present

## 2022-03-10 LAB — CBC WITH DIFFERENTIAL (CANCER CENTER ONLY)
Abs Immature Granulocytes: 0 10*3/uL (ref 0.00–0.07)
Basophils Absolute: 0 10*3/uL (ref 0.0–0.1)
Basophils Relative: 1 %
Eosinophils Absolute: 0.2 10*3/uL (ref 0.0–0.5)
Eosinophils Relative: 5 %
HCT: 29.7 % — ABNORMAL LOW (ref 36.0–46.0)
Hemoglobin: 9.7 g/dL — ABNORMAL LOW (ref 12.0–15.0)
Immature Granulocytes: 0 %
Lymphocytes Relative: 54 %
Lymphs Abs: 1.8 10*3/uL (ref 0.7–4.0)
MCH: 31.9 pg (ref 26.0–34.0)
MCHC: 32.7 g/dL (ref 30.0–36.0)
MCV: 97.7 fL (ref 80.0–100.0)
Monocytes Absolute: 0.2 10*3/uL (ref 0.1–1.0)
Monocytes Relative: 6 %
Neutro Abs: 1.1 10*3/uL — ABNORMAL LOW (ref 1.7–7.7)
Neutrophils Relative %: 34 %
Platelet Count: 101 10*3/uL — ABNORMAL LOW (ref 150–400)
RBC: 3.04 MIL/uL — ABNORMAL LOW (ref 3.87–5.11)
RDW: 12.4 % (ref 11.5–15.5)
WBC Count: 3.3 10*3/uL — ABNORMAL LOW (ref 4.0–10.5)
nRBC: 0 % (ref 0.0–0.2)

## 2022-03-10 LAB — CMP (CANCER CENTER ONLY)
ALT: 26 U/L (ref 0–44)
AST: 22 U/L (ref 15–41)
Albumin: 4.2 g/dL (ref 3.5–5.0)
Alkaline Phosphatase: 69 U/L (ref 38–126)
Anion gap: 10 (ref 5–15)
BUN: 62 mg/dL — ABNORMAL HIGH (ref 8–23)
CO2: 18 mmol/L — ABNORMAL LOW (ref 22–32)
Calcium: 9.2 mg/dL (ref 8.9–10.3)
Chloride: 111 mmol/L (ref 98–111)
Creatinine: 2.59 mg/dL — ABNORMAL HIGH (ref 0.44–1.00)
GFR, Estimated: 20 mL/min — ABNORMAL LOW (ref >60)
Glucose, Bld: 139 mg/dL — ABNORMAL HIGH (ref 70–99)
Potassium: 4.5 mmol/L (ref 3.5–5.1)
Sodium: 139 mmol/L (ref 135–145)
Total Bilirubin: 0.3 mg/dL (ref 0.3–1.2)
Total Protein: 7.5 g/dL (ref 6.5–8.1)

## 2022-03-13 LAB — TACROLIMUS LEVEL: Tacrolimus (FK506) - LabCorp: 2.1 ng/mL (ref 2.0–20.0)

## 2022-03-15 ENCOUNTER — Inpatient Hospital Stay: Payer: BC Managed Care – PPO

## 2022-03-15 ENCOUNTER — Other Ambulatory Visit: Payer: BC Managed Care – PPO

## 2022-03-15 ENCOUNTER — Telehealth: Payer: Self-pay

## 2022-03-15 DIAGNOSIS — C9110 Chronic lymphocytic leukemia of B-cell type not having achieved remission: Secondary | ICD-10-CM

## 2022-03-15 DIAGNOSIS — D619 Aplastic anemia, unspecified: Secondary | ICD-10-CM | POA: Diagnosis not present

## 2022-03-15 LAB — CMP (CANCER CENTER ONLY)
ALT: 30 U/L (ref 0–44)
AST: 25 U/L (ref 15–41)
Albumin: 4.2 g/dL (ref 3.5–5.0)
Alkaline Phosphatase: 68 U/L (ref 38–126)
Anion gap: 7 (ref 5–15)
BUN: 69 mg/dL — ABNORMAL HIGH (ref 8–23)
CO2: 20 mmol/L — ABNORMAL LOW (ref 22–32)
Calcium: 9.4 mg/dL (ref 8.9–10.3)
Chloride: 109 mmol/L (ref 98–111)
Creatinine: 2.7 mg/dL — ABNORMAL HIGH (ref 0.44–1.00)
GFR, Estimated: 19 mL/min — ABNORMAL LOW (ref >60)
Glucose, Bld: 113 mg/dL — ABNORMAL HIGH (ref 70–99)
Potassium: 5.5 mmol/L — ABNORMAL HIGH (ref 3.5–5.1)
Sodium: 136 mmol/L (ref 135–145)
Total Bilirubin: 0.3 mg/dL (ref 0.3–1.2)
Total Protein: 7.7 g/dL (ref 6.5–8.1)

## 2022-03-15 LAB — CBC WITH DIFFERENTIAL (CANCER CENTER ONLY)
Abs Immature Granulocytes: 0.01 10*3/uL (ref 0.00–0.07)
Basophils Absolute: 0 10*3/uL (ref 0.0–0.1)
Basophils Relative: 1 %
Eosinophils Absolute: 0.3 10*3/uL (ref 0.0–0.5)
Eosinophils Relative: 6 %
HCT: 29.4 % — ABNORMAL LOW (ref 36.0–46.0)
Hemoglobin: 9.6 g/dL — ABNORMAL LOW (ref 12.0–15.0)
Immature Granulocytes: 0 %
Lymphocytes Relative: 56 %
Lymphs Abs: 2.1 10*3/uL (ref 0.7–4.0)
MCH: 31.7 pg (ref 26.0–34.0)
MCHC: 32.7 g/dL (ref 30.0–36.0)
MCV: 97 fL (ref 80.0–100.0)
Monocytes Absolute: 0.3 10*3/uL (ref 0.1–1.0)
Monocytes Relative: 6 %
Neutro Abs: 1.2 10*3/uL — ABNORMAL LOW (ref 1.7–7.7)
Neutrophils Relative %: 31 %
Platelet Count: 114 10*3/uL — ABNORMAL LOW (ref 150–400)
RBC: 3.03 MIL/uL — ABNORMAL LOW (ref 3.87–5.11)
RDW: 12.3 % (ref 11.5–15.5)
WBC Count: 3.9 10*3/uL — ABNORMAL LOW (ref 4.0–10.5)
nRBC: 0 % (ref 0.0–0.2)

## 2022-03-15 NOTE — Telephone Encounter (Signed)
Per Dr Benay Spice labs routed to Dr Algernon Huxley for review.

## 2022-03-17 DIAGNOSIS — Z9484 Stem cells transplant status: Principal | ICD-10-CM

## 2022-03-17 LAB — TACROLIMUS LEVEL: Tacrolimus (FK506) - LabCorp: 4.6 ng/mL (ref 2.0–20.0)

## 2022-03-17 MED ORDER — TACROLIMUS 0.5 MG CAPSULE, IMMEDIATE-RELEASE
ORAL_CAPSULE | Freq: Two times a day (BID) | ORAL | 2 refills | 30 days | Status: CP
Start: 2022-03-17 — End: ?

## 2022-03-21 ENCOUNTER — Ambulatory Visit: Admit: 2022-03-21 | Discharge: 2022-03-22 | Payer: PRIVATE HEALTH INSURANCE

## 2022-03-21 ENCOUNTER — Other Ambulatory Visit: Admit: 2022-03-21 | Discharge: 2022-03-22 | Payer: PRIVATE HEALTH INSURANCE

## 2022-03-21 DIAGNOSIS — E039 Hypothyroidism, unspecified: Secondary | ICD-10-CM | POA: Diagnosis not present

## 2022-03-21 DIAGNOSIS — F419 Anxiety disorder, unspecified: Secondary | ICD-10-CM | POA: Diagnosis not present

## 2022-03-21 DIAGNOSIS — R569 Unspecified convulsions: Secondary | ICD-10-CM | POA: Diagnosis not present

## 2022-03-21 DIAGNOSIS — D84822 Immunodeficiency due to external causes: Secondary | ICD-10-CM | POA: Diagnosis not present

## 2022-03-21 DIAGNOSIS — K5900 Constipation, unspecified: Secondary | ICD-10-CM | POA: Diagnosis not present

## 2022-03-21 DIAGNOSIS — Z5181 Encounter for therapeutic drug level monitoring: Secondary | ICD-10-CM | POA: Diagnosis not present

## 2022-03-21 DIAGNOSIS — Z79899 Other long term (current) drug therapy: Secondary | ICD-10-CM | POA: Diagnosis not present

## 2022-03-21 DIAGNOSIS — C9110 Chronic lymphocytic leukemia of B-cell type not having achieved remission: Secondary | ICD-10-CM | POA: Diagnosis not present

## 2022-03-21 DIAGNOSIS — G4700 Insomnia, unspecified: Secondary | ICD-10-CM | POA: Diagnosis not present

## 2022-03-21 DIAGNOSIS — N2589 Other disorders resulting from impaired renal tubular function: Secondary | ICD-10-CM | POA: Diagnosis not present

## 2022-03-21 DIAGNOSIS — Z9484 Stem cells transplant status: Secondary | ICD-10-CM | POA: Diagnosis not present

## 2022-03-21 DIAGNOSIS — Z48298 Encounter for aftercare following other organ transplant: Secondary | ICD-10-CM | POA: Diagnosis not present

## 2022-03-21 DIAGNOSIS — D61818 Other pancytopenia: Secondary | ICD-10-CM | POA: Diagnosis not present

## 2022-03-21 DIAGNOSIS — Z79890 Hormone replacement therapy (postmenopausal): Secondary | ICD-10-CM | POA: Diagnosis not present

## 2022-03-21 DIAGNOSIS — Z9481 Bone marrow transplant status: Secondary | ICD-10-CM | POA: Diagnosis not present

## 2022-03-21 DIAGNOSIS — N184 Chronic kidney disease, stage 4 (severe): Secondary | ICD-10-CM | POA: Diagnosis not present

## 2022-03-21 DIAGNOSIS — F319 Bipolar disorder, unspecified: Secondary | ICD-10-CM | POA: Diagnosis not present

## 2022-03-21 DIAGNOSIS — I129 Hypertensive chronic kidney disease with stage 1 through stage 4 chronic kidney disease, or unspecified chronic kidney disease: Secondary | ICD-10-CM | POA: Diagnosis not present

## 2022-03-21 MED ORDER — LEVOTHYROXINE 100 MCG TABLET
ORAL_TABLET | Freq: Every day | ORAL | 1 refills | 90 days | Status: CP
Start: 2022-03-21 — End: ?

## 2022-03-21 MED ORDER — OLANZAPINE 2.5 MG TABLET
ORAL_TABLET | Freq: Every evening | ORAL | 2 refills | 15 days | Status: CP
Start: 2022-03-21 — End: 2023-03-21

## 2022-03-21 MED ORDER — MAGNESIUM OXIDE-MAGNESIUM AMINO ACID CHELATE 133 MG TABLET
ORAL_TABLET | Freq: Every day | ORAL | 1 refills | 100 days | Status: CP
Start: 2022-03-21 — End: ?

## 2022-03-29 MED ORDER — AMLODIPINE 10 MG TABLET
ORAL_TABLET | Freq: Every day | ORAL | 2 refills | 30 days | Status: CP
Start: 2022-03-29 — End: ?

## 2022-03-30 MED ORDER — TRAZODONE 50 MG TABLET
ORAL_TABLET | Freq: Every evening | ORAL | 3 refills | 0 days
Start: 2022-03-30 — End: ?

## 2022-04-02 MED ORDER — LACOSAMIDE 100 MG TABLET
ORAL_TABLET | Freq: Two times a day (BID) | ORAL | 1 refills | 0 days
Start: 2022-04-02 — End: ?

## 2022-04-04 ENCOUNTER — Other Ambulatory Visit: Admit: 2022-04-04 | Discharge: 2022-04-05 | Payer: PRIVATE HEALTH INSURANCE

## 2022-04-04 ENCOUNTER — Ambulatory Visit: Admit: 2022-04-04 | Discharge: 2022-04-05 | Payer: PRIVATE HEALTH INSURANCE

## 2022-04-04 DIAGNOSIS — D61818 Other pancytopenia: Secondary | ICD-10-CM | POA: Diagnosis not present

## 2022-04-04 DIAGNOSIS — D84822 Immunodeficiency due to external causes: Secondary | ICD-10-CM | POA: Diagnosis not present

## 2022-04-04 DIAGNOSIS — Z9481 Bone marrow transplant status: Secondary | ICD-10-CM | POA: Diagnosis not present

## 2022-04-04 DIAGNOSIS — C9110 Chronic lymphocytic leukemia of B-cell type not having achieved remission: Secondary | ICD-10-CM | POA: Diagnosis not present

## 2022-04-04 DIAGNOSIS — Z9484 Stem cells transplant status: Secondary | ICD-10-CM | POA: Diagnosis not present

## 2022-04-04 DIAGNOSIS — D849 Immunodeficiency, unspecified: Principal | ICD-10-CM

## 2022-04-04 MED ORDER — LACOSAMIDE 100 MG TABLET
ORAL_TABLET | Freq: Two times a day (BID) | ORAL | 1 refills | 30 days
Start: 2022-04-04 — End: ?

## 2022-04-04 MED ORDER — ATOVAQUONE 750 MG/5 ML ORAL SUSPENSION
Freq: Every day | ORAL | 5 refills | 30 days | Status: CP
Start: 2022-04-04 — End: 2022-04-04

## 2022-04-04 MED ORDER — LOKELMA 10 GRAM ORAL POWDER PACKET
PACK | 1 refills | 0 days
Start: 2022-04-04 — End: ?

## 2022-04-05 MED ORDER — SODIUM ZIRCONIUM CYCLOSILICATE 10 GRAM ORAL POWDER PACKET
PACK | Freq: Every day | ORAL | 1 refills | 30 days | Status: CP
Start: 2022-04-05 — End: ?

## 2022-04-05 MED ORDER — LOKELMA 10 GRAM ORAL POWDER PACKET
PACK | 1 refills | 0 days
Start: 2022-04-05 — End: ?

## 2022-04-06 MED ORDER — LACOSAMIDE 100 MG TABLET
ORAL_TABLET | Freq: Two times a day (BID) | ORAL | 1 refills | 0 days
Start: 2022-04-06 — End: ?

## 2022-04-08 MED ORDER — LACOSAMIDE 100 MG TABLET
ORAL_TABLET | Freq: Two times a day (BID) | ORAL | 0 refills | 5 days | Status: CP
Start: 2022-04-08 — End: 2022-04-13

## 2022-04-10 DIAGNOSIS — Z9484 Stem cells transplant status: Principal | ICD-10-CM

## 2022-04-10 MED ORDER — TRAZODONE 50 MG TABLET
ORAL_TABLET | Freq: Every evening | ORAL | 3 refills | 30 days | Status: CP
Start: 2022-04-10 — End: ?

## 2022-04-10 MED ORDER — LEVOTHYROXINE 100 MCG TABLET
ORAL_TABLET | 1 refills | 0 days | Status: CP
Start: 2022-04-10 — End: ?

## 2022-04-19 ENCOUNTER — Other Ambulatory Visit: Admit: 2022-04-19 | Discharge: 2022-04-20 | Payer: PRIVATE HEALTH INSURANCE

## 2022-04-19 ENCOUNTER — Ambulatory Visit: Admit: 2022-04-19 | Discharge: 2022-04-20 | Payer: PRIVATE HEALTH INSURANCE

## 2022-04-19 DIAGNOSIS — K5900 Constipation, unspecified: Secondary | ICD-10-CM | POA: Diagnosis not present

## 2022-04-19 DIAGNOSIS — Z79899 Other long term (current) drug therapy: Secondary | ICD-10-CM | POA: Diagnosis not present

## 2022-04-19 DIAGNOSIS — Z79890 Hormone replacement therapy (postmenopausal): Secondary | ICD-10-CM | POA: Diagnosis not present

## 2022-04-19 DIAGNOSIS — R918 Other nonspecific abnormal finding of lung field: Secondary | ICD-10-CM | POA: Diagnosis not present

## 2022-04-19 DIAGNOSIS — G40909 Epilepsy, unspecified, not intractable, without status epilepticus: Secondary | ICD-10-CM | POA: Diagnosis not present

## 2022-04-19 DIAGNOSIS — E039 Hypothyroidism, unspecified: Secondary | ICD-10-CM | POA: Diagnosis not present

## 2022-04-19 DIAGNOSIS — N2589 Other disorders resulting from impaired renal tubular function: Secondary | ICD-10-CM | POA: Diagnosis not present

## 2022-04-19 DIAGNOSIS — I129 Hypertensive chronic kidney disease with stage 1 through stage 4 chronic kidney disease, or unspecified chronic kidney disease: Secondary | ICD-10-CM | POA: Diagnosis not present

## 2022-04-19 DIAGNOSIS — Z5181 Encounter for therapeutic drug level monitoring: Secondary | ICD-10-CM | POA: Diagnosis not present

## 2022-04-19 DIAGNOSIS — C9110 Chronic lymphocytic leukemia of B-cell type not having achieved remission: Secondary | ICD-10-CM | POA: Diagnosis not present

## 2022-04-19 DIAGNOSIS — F32A Depression, unspecified: Secondary | ICD-10-CM | POA: Diagnosis not present

## 2022-04-19 DIAGNOSIS — D84822 Immunodeficiency due to external causes: Secondary | ICD-10-CM | POA: Diagnosis not present

## 2022-04-19 DIAGNOSIS — Z9484 Stem cells transplant status: Secondary | ICD-10-CM | POA: Diagnosis not present

## 2022-04-19 DIAGNOSIS — Z9481 Bone marrow transplant status: Secondary | ICD-10-CM | POA: Diagnosis not present

## 2022-04-19 DIAGNOSIS — N184 Chronic kidney disease, stage 4 (severe): Secondary | ICD-10-CM | POA: Diagnosis not present

## 2022-05-22 ENCOUNTER — Ambulatory Visit
Admit: 2022-05-22 | Discharge: 2022-05-23 | Payer: PRIVATE HEALTH INSURANCE | Attending: Primary Care | Primary: Primary Care

## 2022-05-22 ENCOUNTER — Ambulatory Visit: Admit: 2022-05-22 | Discharge: 2022-05-23 | Payer: PRIVATE HEALTH INSURANCE

## 2022-05-22 ENCOUNTER — Other Ambulatory Visit: Admit: 2022-05-22 | Discharge: 2022-05-23 | Payer: PRIVATE HEALTH INSURANCE

## 2022-05-22 DIAGNOSIS — Z9484 Stem cells transplant status: Secondary | ICD-10-CM | POA: Diagnosis not present

## 2022-05-22 DIAGNOSIS — C9111 Chronic lymphocytic leukemia of B-cell type in remission: Secondary | ICD-10-CM | POA: Diagnosis not present

## 2022-05-22 DIAGNOSIS — Z23 Encounter for immunization: Secondary | ICD-10-CM | POA: Diagnosis not present

## 2022-05-22 DIAGNOSIS — D849 Immunodeficiency, unspecified: Secondary | ICD-10-CM | POA: Diagnosis not present

## 2022-05-22 DIAGNOSIS — Z79890 Hormone replacement therapy (postmenopausal): Secondary | ICD-10-CM | POA: Diagnosis not present

## 2022-05-22 DIAGNOSIS — Z79899 Other long term (current) drug therapy: Secondary | ICD-10-CM | POA: Diagnosis not present

## 2022-05-22 DIAGNOSIS — N184 Chronic kidney disease, stage 4 (severe): Secondary | ICD-10-CM | POA: Diagnosis not present

## 2022-05-22 DIAGNOSIS — Z9481 Bone marrow transplant status: Secondary | ICD-10-CM | POA: Diagnosis not present

## 2022-06-02 MED ORDER — OLANZAPINE 5 MG TABLET
ORAL_TABLET | Freq: Every evening | ORAL | 2 refills | 0 days
Start: 2022-06-02 — End: ?

## 2022-06-06 ENCOUNTER — Other Ambulatory Visit: Payer: Self-pay | Admitting: *Deleted

## 2022-06-06 DIAGNOSIS — D619 Aplastic anemia, unspecified: Secondary | ICD-10-CM

## 2022-06-07 ENCOUNTER — Encounter: Payer: Self-pay | Admitting: *Deleted

## 2022-06-07 NOTE — Progress Notes (Signed)
Per Dr. Benay Spice: Sent referral and demographics to [facility] for Dr. Azzie Glatter or Dr. Salvatore Decent to see patient for renal failure w/H/O BMT for aplastic anemia.  Faxed to [phone] Notified husband that referral sent and provided him phone # to f/u.

## 2022-06-08 MED ORDER — LOKELMA 10 GRAM ORAL POWDER PACKET
PACK | 1 refills | 0 days
Start: 2022-06-08 — End: ?

## 2022-06-12 MED ORDER — LOKELMA 10 GRAM ORAL POWDER PACKET
PACK | 1 refills | 0 days | Status: CP
Start: 2022-06-12 — End: ?

## 2022-06-19 ENCOUNTER — Other Ambulatory Visit: Admit: 2022-06-19 | Discharge: 2022-06-19 | Payer: PRIVATE HEALTH INSURANCE

## 2022-06-19 ENCOUNTER — Ambulatory Visit: Admit: 2022-06-19 | Discharge: 2022-06-19 | Payer: PRIVATE HEALTH INSURANCE

## 2022-06-19 ENCOUNTER — Ambulatory Visit
Admit: 2022-06-19 | Discharge: 2022-06-19 | Payer: PRIVATE HEALTH INSURANCE | Attending: Primary Care | Primary: Primary Care

## 2022-06-19 DIAGNOSIS — T50995A Adverse effect of other drugs, medicaments and biological substances, initial encounter: Secondary | ICD-10-CM | POA: Diagnosis not present

## 2022-06-19 DIAGNOSIS — R630 Anorexia: Secondary | ICD-10-CM | POA: Diagnosis not present

## 2022-06-19 DIAGNOSIS — T865 Complications of stem cell transplant: Secondary | ICD-10-CM | POA: Diagnosis not present

## 2022-06-19 DIAGNOSIS — D849 Immunodeficiency, unspecified: Secondary | ICD-10-CM | POA: Diagnosis not present

## 2022-06-19 DIAGNOSIS — G4700 Insomnia, unspecified: Secondary | ICD-10-CM | POA: Diagnosis not present

## 2022-06-19 DIAGNOSIS — I129 Hypertensive chronic kidney disease with stage 1 through stage 4 chronic kidney disease, or unspecified chronic kidney disease: Secondary | ICD-10-CM | POA: Diagnosis not present

## 2022-06-19 DIAGNOSIS — F319 Bipolar disorder, unspecified: Secondary | ICD-10-CM | POA: Diagnosis not present

## 2022-06-19 DIAGNOSIS — Z9481 Bone marrow transplant status: Secondary | ICD-10-CM | POA: Diagnosis not present

## 2022-06-19 DIAGNOSIS — D6101 Constitutional (pure) red blood cell aplasia: Secondary | ICD-10-CM | POA: Diagnosis not present

## 2022-06-19 DIAGNOSIS — D61811 Other drug-induced pancytopenia: Secondary | ICD-10-CM | POA: Diagnosis not present

## 2022-06-19 DIAGNOSIS — Z9484 Stem cells transplant status: Secondary | ICD-10-CM | POA: Diagnosis not present

## 2022-06-19 DIAGNOSIS — C9111 Chronic lymphocytic leukemia of B-cell type in remission: Secondary | ICD-10-CM | POA: Diagnosis not present

## 2022-06-19 DIAGNOSIS — N184 Chronic kidney disease, stage 4 (severe): Secondary | ICD-10-CM | POA: Diagnosis not present

## 2022-06-19 DIAGNOSIS — Y830 Surgical operation with transplant of whole organ as the cause of abnormal reaction of the patient, or of later complication, without mention of misadventure at the time of the procedure: Secondary | ICD-10-CM | POA: Diagnosis not present

## 2022-06-19 DIAGNOSIS — K5900 Constipation, unspecified: Secondary | ICD-10-CM | POA: Diagnosis not present

## 2022-06-19 DIAGNOSIS — Z79899 Other long term (current) drug therapy: Secondary | ICD-10-CM | POA: Diagnosis not present

## 2022-06-19 DIAGNOSIS — C9110 Chronic lymphocytic leukemia of B-cell type not having achieved remission: Secondary | ICD-10-CM | POA: Diagnosis not present

## 2022-06-22 DIAGNOSIS — Z9484 Stem cells transplant status: Principal | ICD-10-CM

## 2022-06-23 DIAGNOSIS — L600 Ingrowing nail: Secondary | ICD-10-CM | POA: Diagnosis not present

## 2022-06-26 MED ORDER — AMLODIPINE 10 MG TABLET
ORAL_TABLET | Freq: Every day | ORAL | 0 refills | 0 days
Start: 2022-06-26 — End: ?

## 2022-06-28 MED ORDER — AMLODIPINE 10 MG TABLET
ORAL_TABLET | Freq: Every day | ORAL | 0 refills | 90 days | Status: CP
Start: 2022-06-28 — End: ?

## 2022-07-11 MED ORDER — SODIUM ZIRCONIUM CYCLOSILICATE 10 GRAM ORAL POWDER PACKET
PACK | 1 refills | 0 days | Status: CP
Start: 2022-07-11 — End: ?

## 2022-07-14 ENCOUNTER — Ambulatory Visit: Admit: 2022-07-14 | Discharge: 2022-07-15 | Payer: PRIVATE HEALTH INSURANCE

## 2022-07-14 ENCOUNTER — Other Ambulatory Visit: Admit: 2022-07-14 | Discharge: 2022-07-15 | Payer: PRIVATE HEALTH INSURANCE

## 2022-07-14 ENCOUNTER — Ambulatory Visit: Admit: 2022-07-14 | Discharge: 2022-07-15 | Payer: PRIVATE HEALTH INSURANCE | Attending: Family | Primary: Family

## 2022-07-14 DIAGNOSIS — Z931 Gastrostomy status: Secondary | ICD-10-CM | POA: Diagnosis not present

## 2022-07-14 DIAGNOSIS — F319 Bipolar disorder, unspecified: Secondary | ICD-10-CM | POA: Diagnosis not present

## 2022-07-14 DIAGNOSIS — G4701 Insomnia due to medical condition: Secondary | ICD-10-CM | POA: Diagnosis not present

## 2022-07-14 DIAGNOSIS — D801 Nonfamilial hypogammaglobulinemia: Secondary | ICD-10-CM | POA: Diagnosis not present

## 2022-07-14 DIAGNOSIS — C9110 Chronic lymphocytic leukemia of B-cell type not having achieved remission: Secondary | ICD-10-CM | POA: Diagnosis not present

## 2022-07-14 DIAGNOSIS — K5900 Constipation, unspecified: Secondary | ICD-10-CM | POA: Diagnosis not present

## 2022-07-14 DIAGNOSIS — E2740 Unspecified adrenocortical insufficiency: Secondary | ICD-10-CM | POA: Diagnosis not present

## 2022-07-14 DIAGNOSIS — R630 Anorexia: Secondary | ICD-10-CM | POA: Diagnosis not present

## 2022-07-14 DIAGNOSIS — E039 Hypothyroidism, unspecified: Secondary | ICD-10-CM | POA: Diagnosis not present

## 2022-07-14 DIAGNOSIS — B349 Viral infection, unspecified: Secondary | ICD-10-CM | POA: Diagnosis not present

## 2022-07-14 DIAGNOSIS — D703 Neutropenia due to infection: Secondary | ICD-10-CM | POA: Diagnosis not present

## 2022-07-14 DIAGNOSIS — Z9484 Stem cells transplant status: Secondary | ICD-10-CM | POA: Diagnosis not present

## 2022-07-14 DIAGNOSIS — D61818 Other pancytopenia: Secondary | ICD-10-CM | POA: Diagnosis not present

## 2022-07-14 DIAGNOSIS — D849 Immunodeficiency, unspecified: Secondary | ICD-10-CM | POA: Diagnosis not present

## 2022-07-14 DIAGNOSIS — R7881 Bacteremia: Secondary | ICD-10-CM | POA: Diagnosis not present

## 2022-07-14 DIAGNOSIS — I10 Essential (primary) hypertension: Secondary | ICD-10-CM | POA: Diagnosis not present

## 2022-07-14 DIAGNOSIS — R911 Solitary pulmonary nodule: Secondary | ICD-10-CM | POA: Diagnosis not present

## 2022-07-31 DIAGNOSIS — Z9484 Stem cells transplant status: Principal | ICD-10-CM

## 2022-07-31 MED ORDER — TACROLIMUS 0.5 MG CAPSULE, IMMEDIATE-RELEASE
ORAL_CAPSULE | Freq: Two times a day (BID) | ORAL | 2 refills | 30 days | Status: CP
Start: 2022-07-31 — End: ?

## 2022-08-05 MED ORDER — TRAZODONE 50 MG TABLET
ORAL_TABLET | 3 refills | 0 days
Start: 2022-08-05 — End: ?

## 2022-08-08 ENCOUNTER — Other Ambulatory Visit: Admit: 2022-08-08 | Discharge: 2022-08-09 | Payer: PRIVATE HEALTH INSURANCE

## 2022-08-08 ENCOUNTER — Ambulatory Visit: Admit: 2022-08-08 | Discharge: 2022-08-09 | Payer: PRIVATE HEALTH INSURANCE

## 2022-08-08 ENCOUNTER — Encounter: Payer: Self-pay | Admitting: *Deleted

## 2022-08-08 ENCOUNTER — Telehealth: Payer: Self-pay | Admitting: *Deleted

## 2022-08-08 DIAGNOSIS — C9110 Chronic lymphocytic leukemia of B-cell type not having achieved remission: Secondary | ICD-10-CM | POA: Diagnosis not present

## 2022-08-08 DIAGNOSIS — Z9484 Stem cells transplant status: Secondary | ICD-10-CM | POA: Diagnosis not present

## 2022-08-08 DIAGNOSIS — Z5181 Encounter for therapeutic drug level monitoring: Secondary | ICD-10-CM | POA: Diagnosis not present

## 2022-08-08 DIAGNOSIS — D84822 Immunodeficiency due to external causes: Secondary | ICD-10-CM | POA: Diagnosis not present

## 2022-08-08 MED ORDER — OLANZAPINE 2.5 MG TABLET
ORAL_TABLET | Freq: Every evening | ORAL | 11 refills | 30 days | Status: CP
Start: 2022-08-08 — End: 2023-08-08

## 2022-08-08 MED ORDER — TACROLIMUS 0.5 MG CAPSULE, IMMEDIATE-RELEASE
ORAL_CAPSULE | ORAL | 2 refills | 40 days | Status: CP
Start: 2022-08-08 — End: ?

## 2022-08-08 NOTE — Telephone Encounter (Signed)
Meagan Walsh called to inquire of status of her referral to nephrology at [facility] that was faxed on 06/07/22. Called to [facility] [phone] and confirmed fax [phone] was correct. Was instructed to re-fax referral and note stage of her renal disease. Re-faxed referral with Stage IV renal disease per Dr. Benay Spice.

## 2022-08-09 MED ORDER — TRAZODONE 50 MG TABLET
ORAL_TABLET | 3 refills | 0 days | Status: CP
Start: 2022-08-09 — End: ?

## 2022-08-10 MED ORDER — VALACYCLOVIR 500 MG TABLET
ORAL_TABLET | ORAL | 11 refills | 60 days | Status: CP
Start: 2022-08-10 — End: ?

## 2022-08-13 MED ORDER — LOKELMA 10 GRAM ORAL POWDER PACKET
PACK | 1 refills | 0 days
Start: 2022-08-13 — End: ?

## 2022-08-16 ENCOUNTER — Telehealth: Payer: Self-pay | Admitting: *Deleted

## 2022-08-16 DIAGNOSIS — Z9484 Stem cells transplant status: Principal | ICD-10-CM

## 2022-08-16 DIAGNOSIS — U071 COVID-19: Principal | ICD-10-CM

## 2022-08-16 MED ORDER — LOKELMA 10 GRAM ORAL POWDER PACKET
PACK | 1 refills | 0 days
Start: 2022-08-16 — End: ?

## 2022-08-16 MED ORDER — MOLNUPIRAVIR 200 MG CAPSULE (EUA)
ORAL_CAPSULE | Freq: Two times a day (BID) | ORAL | 0 refills | 5 days | Status: CP
Start: 2022-08-16 — End: 2022-08-21

## 2022-08-16 MED ORDER — SODIUM ZIRCONIUM CYCLOSILICATE 10 GRAM ORAL POWDER PACKET
PACK | 1 refills | 0 days | Status: CP
Start: 2022-08-16 — End: ?

## 2022-08-16 NOTE — Telephone Encounter (Signed)
[facility] [phone] option 7 and confirmed her referral was received and and is in process. They are aware the referral is "urgent" and she has Stage IV renal disease. She should be called in the next 1-2 weeks. Mrs. Anguilla made aware via MyChart message and provided phone # for her to follow up.

## 2022-08-21 ENCOUNTER — Encounter: Payer: Self-pay | Admitting: Psychiatry

## 2022-08-21 ENCOUNTER — Telehealth: Payer: Self-pay | Admitting: Psychiatry

## 2022-08-21 ENCOUNTER — Telehealth (INDEPENDENT_AMBULATORY_CARE_PROVIDER_SITE_OTHER): Payer: BC Managed Care – PPO | Admitting: Psychiatry

## 2022-08-21 DIAGNOSIS — F331 Major depressive disorder, recurrent, moderate: Secondary | ICD-10-CM

## 2022-08-21 DIAGNOSIS — F338 Other recurrent depressive disorders: Secondary | ICD-10-CM

## 2022-08-21 MED ORDER — LITHIUM CARBONATE 300 MG PO TABS: 300.0000 mg | ORAL_TABLET | Freq: Every day | ORAL | 0 refills | Status: DC

## 2022-08-21 NOTE — Telephone Encounter (Signed)
Please call to schedule an appt with Dr. Clovis Pu.

## 2022-08-21 NOTE — Telephone Encounter (Signed)
Pt called at 11:55a.  She said she feels it's urgent that she speak to Dr Clovis Pu about possibly going back on Lithium.  She confirmed it's been about 2 yrs since she last took it and she said she feeling depressed. (Last script refilled was for Alprazolam in 2023).   Pls call her back since Dr Clovis Pu doesn't currently have any openings until mid to late March.  No upcoming appt scheduled.

## 2022-08-21 NOTE — Progress Notes (Signed)
Meagan Walsh [place] [id number] 09-21-1960 62 y.o.   Video Visit via My Chart  I connected with pt by video using My Chart and verified that I am speaking with the correct person using two identifiers.   I discussed the limitations, risks, security and privacy concerns of performing an evaluation and management service by My Chart  and the availability of in person appointments. I also discussed with the patient that there may be a patient responsible charge related to this service. The patient expressed understanding and agreed to proceed.  I discussed the assessment and treatment plan with the patient. The patient was provided an opportunity to ask questions and all were answered. The patient agreed with the plan and demonstrated an understanding of the instructions.   The patient was advised to call back or seek an in-person evaluation if the symptoms worsen or if the condition fails to improve as anticipated.  I provided 30 minutes of video time during this encounter.  The patient was located at home and the provider was located office. Session from [phone]  Subjective:   Patient ID:  Meagan Walsh is a 62 y.o. (DOB 19-Aug-1960) female.  Chief Complaint:  Chief Complaint  Patient presents with   Follow-up   Depression   Post-Traumatic Stress Disorder    Meagan Walsh presents to the office today for follow-up of bipolar depression.  seen in January 2020.  Mood was stable and no meds were changed.   seen May 2020.  Lithium level was 0.68.  Mood was stable and no meds were changed.  Discussed her care with her oncologist Dr. Benay Spice in June 2020.  She was dealing with pancytopenia and it was requested that she wean off lamotrigine to see if it was contributing.  That was done.  August 11, 2019 another conversation with Dr. Benay Spice.  She had elevated lithium of 2.1 on August 08, 2019.  She had lost some weight and was having some nausea and vomiting.  Lithium was held for a  few days then restarted at 300 mg daily.  Lithium levels have been checked frequently since that time.  seen September 24, 2019.  She was not depressed but struggling with nausea and poor appetite and the following changes were made to see if it would help. In view of chronic nausea and weight loss despite med changes consider Trintellix.  Trintellix does cause nausea about 10% of people. It was tolerated in the past. Reduce Trintellix to 10 mg daily. Add mirtazapine 30 mg tablet at night which could help appetite and potentially replace Trintellix. If nausea is not resolved at follow up then stop Trintellix.  Call if depression worsens.  10/29/2019 appt noted: Did fine with med changes.  No problems with reduction in Trintellix.  Gained 11# since here.  At a good weight.  Wonders about stopping it for awhile. Good appetite.  No nausea now.  Still struggling with  CLL.  Needs bone marrow transplant but hasn't been able to occur yet and will be done in [place] with her living there for 3 mos.  Tired of being in limbo.  Rare Xanax used as nausea med.  Using Ambien at HS.  Sleep alright.   In spite of stress has not been depressed.  Patient reports stable mood and denies depressed or irritable moods.  Patient denies any recent difficulty with anxiety.  Patient denies difficulty with sleep initiation or maintenance with Ambien. Denies appetite disturbance.  No weight loss.  Patient  reports that energy and motivation have been good.  Patient denies any difficulty with concentration.  Patient denies any suicidal ideation. Plan: Continue Trintellix to 10 mg daily. Continue or stop mirtazapine 30 mg tablet at night per her request. Weight loss resolved.  10/22/2020 appt noted: 2nd bone marrow transplant in [place] in Jan. Will be there at least a couple of more months. Pending lithium level from today. Lost sig weight this year about 108#.  Feeding tube. Restarted mirtazapine 30 HS about a week ago. Has  had a lot of nausea.   Takes alprazolam this week DT panicky feelings like walls caving in.  Worry over wasting away.  Seemed to help her.  Last day or 2 only needed one.  Don't think I've been depressed, certainly not like in the past.  More like anxiety feeling trapped.  In general feels ok. Still on trintellix 10 HS, lithium 300 mg HS, Ambien 5-10 mg HS. Sleep is pretty good.  Now labs 3 times per week and adjusting antirejection med.   Tolerating psych meds as far as known bc no history of SE with psych meds.  03/25/2021 TC with oncology: Return telephone call to oncologist at [facility] Dr. Theodoro Clock. Reports that the patient is not eating well and has required a G-tube as well as a central line.  Dr. Theodoro Clock believes the patient has developed anorexia nervosa and is restricting not only calories but also fluids.  There is no other reasons for the gastric tube or further central line other than maintaining hydration and nutrition because she will not take in enough orally. The patient is no longer on mirtazapine but is still on Trintellix.  Suggested the oncologist restart the mirtazapine 30 mg nightly because it helped the patient gain weight before.  We will get her in to the office as soon as possible.  If in fact she does have symptoms of any her anorexia nervosa then she will need more intensive therapy.  Dr. Theodoro Clock believes she may need an inpatient anorexia nervosa program.  04/01/2021 appt noted: Covid 03/04/21.   She's aware oncology is concerned she might have anorexia.  Hard time gaining weight.  Full so fast and when try to push it.  When in hospital went so long without eating.  Has feeding tube and high calorie food gives her diarrhea. Doesn't remember why she stopped mirtazapine. "I'm too thin" and hate the way I look.  Doesn't want to lose more bc of health danger.  No thirst and relying on Pick line for hydration getting IV fluids daily.   No history of EDO when younger. Not  depressed.  Not anxious unusuallly.  It is better now.  Sleep is OK and about 9 hours nightly.  Still takes Ambien to go to sleep.  04/15/21 appt noted: Mirtazapine helped appetite and eaten more.  Diarrhea 2 days and have been off.  Might have gained a little.  A little better with fluids but not great.  Pick line removed.  Put fluids in GI tube.   No problems with mirtazapine. Sometimes has diarrhea but not usually. Weigh herself.   08/21/2022 urgent appointment noted: Has not been seen in some time and had weaned off all psych meds.  Has become more urgently depressed. No psych meds except trazodone for sleep.  No other psych meds for a year.. Regained wt lost in chemo.  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  Still low WBC. Called since  early January more blue and not getting out of house.  Got Covid 2 weeks ago and recovered.  No post-Covid fatigue. Stress son in crisis in [place] and needs to fly out to help.  Fears going back into depression and got scared.  Calling a therpist soon.  Too much free time.  No SI.  Thinks it's Ok to resume lithium again as she stopped while in more intensive chemo.  Those medical concerns are no longer a problem.s   Sometimes THC gummy to help her feel calmer and sleep better prn.  2 times a month has taken alprazolam.   Appetite is oK  2 severe episodes depression in life that last a full year.  Multiple failed prior medication trials include but are not limited to the following:   duloxetine, Viibryd,  Trintellix 20 mg failed until lithium was added Rexulti 0.5 mg caused restlessness, Abilify, Vraylar, Mirtazapine 30 helped appetite.  Review of Systems:  Review of Systems  Constitutional:  Positive for activity change. Negative for appetite change, fatigue and unexpected weight change.  Gastrointestinal:  Negative for diarrhea, nausea and vomiting.  Neurological:  Negative for tremors and weakness.  Psychiatric/Behavioral:  Positive for  dysphoric mood. Negative for agitation, behavioral problems, confusion, decreased concentration, hallucinations, self-injury, sleep disturbance and suicidal ideas. The patient is not nervous/anxious and is not hyperactive.     Medications: I have reviewed the patient's current medications.  Current Outpatient Medications  Medication Sig Dispense Refill   levothyroxine (SYNTHROID) 100 MCG tablet TAKE 1 TABLET BY MOUTH EVERY DAY 30 tablet 2   traZODone (DESYREL) 50 MG tablet Take 50 mg by mouth at bedtime. 2 TABS hs     acetaminophen (TYLENOL) 325 MG tablet Take 650 mg by mouth every 6 (six) hours as needed. (Patient not taking: Reported on 10/22/2020)     ALPRAZolam (XANAX) 0.25 MG tablet TAKE 1 TO 2 TABLETS BY MOUTH EVERY DAY AS NEEDED FOR ANXIETY (Patient not taking: Reported on 08/21/2022) 60 tablet 2   famotidine (PEPCID) 20 MG tablet Take 1 tablet (20 mg total) by mouth 2 (two) times daily. (Patient not taking: Reported on 08/21/2022) 60 tablet 2   folic acid (FOLVITE) 1 MG tablet TAKE 1 TABLET BY MOUTH EVERY DAY (Patient not taking: Reported on 08/21/2022) 90 tablet 0   lithium 300 MG tablet Take 1 tablet (300 mg total) by mouth at bedtime. 90 tablet 0   Magnesium 200 MG TABS Take 200 mg by mouth. (Patient not taking: Reported on 04/15/2021)     Maribavir 200 MG TABS Take by mouth in the morning and at bedtime. (Patient not taking: Reported on 04/15/2021)     mirtazapine (REMERON) 30 MG tablet Take 1 tablet (30 mg total) by mouth at bedtime. (Patient not taking: Reported on 08/21/2022) 90 tablet 0   Polyethylene Glycol 3350 (MIRALAX PO) Take 17 g by mouth daily as needed.     prochlorperazine (COMPAZINE) 5 MG tablet TAKE 1 TO 2 TABLETS EVERY 6 HOURS AS NEEDED FOR NAUSEA OR VOMITING FOR UP TO 3 DAYS (Patient not taking: Reported on 08/21/2022) 60 tablet 1   tacrolimus (PROGRAF) 0.5 MG capsule Take 1.5 mg by mouth in the morning and at bedtime. (Patient not taking: Reported on 04/01/2021)     TRINTELLIX  10 MG TABS tablet TAKE 1 TABLET BY MOUTH EVERY DAY (Patient not taking: Reported on 08/21/2022) 90 tablet 0   zolpidem (AMBIEN) 10 MG tablet TAKE 1 TABLET BY MOUTH EVERYDAY AT BEDTIME (  Patient not taking: Reported on 08/21/2022) 90 tablet 0   No current facility-administered medications for this visit.   Facility-Administered Medications Ordered in Other Visits  Medication Dose Route Frequency Provider Last Rate Last Admin   0.9 %  sodium chloride infusion (Manually program via Guardrails IV Fluids)  250 mL Intravenous Once Ladell Pier, MD       0.9 %  sodium chloride infusion (Manually program via Guardrails IV Fluids)  250 mL Intravenous Once Ladell Pier, MD       heparin lock flush 100 unit/mL  250 Units Intracatheter PRN Ladell Pier, MD       sodium chloride flush (NS) 0.9 % injection 10 mL  10 mL Intracatheter PRN Ladell Pier, MD   10 mL at 02/21/19 0945   sodium chloride flush (NS) 0.9 % injection 10 mL  10 mL Intracatheter PRN Ladell Pier, MD       SUMAtriptan (IMITREX) nasal spray 20 mg  20 mg Nasal Once Ladell Pier, MD        Medication Side Effects: None  Allergies: No Known Allergies  Past Medical History:  Diagnosis Date   Anxiety    Cancer ([place])    CLL   Degenerative disk disease    Neck--chronic pain   Depression    Fibroids    Uterine   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   Hypothyroidism    Lymphocytosis    Mitral valve prolapse    Mitral valve prolapse    Thyroid disease    Hypothyroid    Family History  Problem Relation Age of Onset   Breast cancer Mother    Prostate cancer Father    Colon cancer Neg Hx    Esophageal cancer Neg Hx    Pancreatic cancer Neg Hx    Stomach cancer Neg Hx     Social History   Socioeconomic History   Marital status: Married    Spouse name: Not on file   Number of children: Not on file   Years of education: Not on file   Highest education level: Not on file   Occupational History   Not on file  Tobacco Use   Smoking status: Never   Smokeless tobacco: Never  Vaping Use   Vaping Use: Not on file  Substance and Sexual Activity   Alcohol use: Not Currently    Comment: 2 glasses of wine 2 times per week    Drug use: No   Sexual activity: Not on file  Other Topics Concern   Not on file  Social History Narrative   Pt lives in 2 story home with her husband   Has 3 adult children   Masters degree   Works at [employer]   Social Determinants of Radio broadcast assistant Strain: Not on Art therapist Insecurity: Not on file  Transportation Needs: Not on file  Physical Activity: Not on file  Stress: Not on file  Social Connections: Not on file  Intimate Partner Violence: Not on file    Past Medical History, Surgical history, Social history, and Family history were reviewed and updated as appropriate.   Please see review of systems for further details on the patient's review from today.   Objective:   Physical Exam:  There were no vitals taken for this visit.  Physical Exam Constitutional:      General: She is not in acute distress.  Appearance: She is well-developed.  Musculoskeletal:        General: No deformity.  Neurological:     Mental Status: She is alert and oriented to person, place, and time.     Motor: No tremor.     Coordination: Coordination normal.     Gait: Gait normal.  Psychiatric:        Attention and Perception: Attention and perception normal.        Mood and Affect: Mood is depressed. Mood is not anxious. Affect is not labile, blunt, angry, tearful or inappropriate.        Speech: Speech normal. Speech is not slurred.        Behavior: Behavior normal.        Thought Content: Thought content normal. Thought content is not delusional. Thought content does not include homicidal or suicidal ideation. Thought content does not include suicidal plan.        Cognition and Memory: Cognition normal.         Judgment: Judgment normal.     Comments: Insight intact. No auditory or visual hallucinations. No delusions.      Lab Review:     Component Value Date/Time   NA 136 03/15/2022 1301   K 5.5 (H) 03/15/2022 1301   CL 109 03/15/2022 1301   CO2 20 (L) 03/15/2022 1301   GLUCOSE 113 (H) 03/15/2022 1301   BUN 69 (H) 03/15/2022 1301   CREATININE 2.70 (H) 03/15/2022 1301   CALCIUM 9.4 03/15/2022 1301   PROT 7.7 03/15/2022 1301   ALBUMIN 4.2 03/15/2022 1301   AST 25 03/15/2022 1301   ALT 30 03/15/2022 1301   ALKPHOS 68 03/15/2022 1301   BILITOT 0.3 03/15/2022 1301   GFRNONAA 19 (L) 03/15/2022 1301   GFRAA >60 12/17/2019 0830       Component Value Date/Time   WBC 3.9 (L) 03/15/2022 1301   WBC 9.9 03/31/2019 0845   RBC 3.03 (L) 03/15/2022 1301   HGB 9.6 (L) 03/15/2022 1301   HGB 14.0 06/27/2016 1038   HCT 29.4 (L) 03/15/2022 1301   HCT 42.0 06/27/2016 1038   PLT 114 (L) 03/15/2022 1301   PLT 205 06/27/2016 1038   MCV 97.0 03/15/2022 1301   MCV 92.9 06/27/2016 1038   MCH 31.7 03/15/2022 1301   MCHC 32.7 03/15/2022 1301   RDW 12.3 03/15/2022 1301   RDW 12.2 06/27/2016 1038   LYMPHSABS 2.1 03/15/2022 1301   LYMPHSABS 21.5 (H) 06/27/2016 1038   MONOABS 0.3 03/15/2022 1301   MONOABS 0.5 06/27/2016 1038   EOSABS 0.3 03/15/2022 1301   EOSABS 0.2 06/27/2016 1038   BASOSABS 0.0 03/15/2022 1301   BASOSABS 0.1 06/27/2016 1038    Lithium Lvl  Date Value Ref Range Status  12/11/2019 0.32 (L) 0.60 - 1.20 mmol/L Final    Comment:    Performed at [facility], [address] [place], [place] [address]    Component 08/24/20 08/14/20 04/02/20 01/07/20  Lithium Lvl 0.4 0.6 0.8 0.6   on 300 mg daily  No results found for: "PHENYTOIN", "PHENOBARB", "VALPROATE", "CBMZ"   .res Assessment: Plan:    Major depressive disorder, recurrent episode, moderate ([place]) - Plan: lithium 300 MG tablet  Seasonal affective disorder (HCC)   Greater than 50% of 30 min video face  to face time with patient was spent on counseling and coordination of care. We discussed Karly has had history of 2 very severe prolonged depressions that failed to respond to multiple psychiatric medications  but then responded to lithium potentiation.  She has been through recent winters with no difficulty.  She has a very strong seasonal pattern.  She previously failed to respond to several antidepressants including her last antidepressant which was Trintellix 20 mg until lithium 300 mg daily was added and she went into remission. She remained in remission through treatment for cancer during which time she had to stop all her psychiatric medications.  She has remained in remission until this January 2024 when she began to feel her depression recurring perhaps worsened by some problems that her son is having.  Her cancer has remained in remission.  We discussed the pros and cons of different options.  Because she responded to the lithium very quickly she may respond to lithium again alone even without an antidepressant.  She understands however that she might need to resume an antidepressant with the lithium but for now it is reasonable to resume lithium alone.  Component 08/24/20 08/14/20 04/02/20 01/07/20  Lithium Lvl 0.4 0.6 0.8 0.6  On 300 mg daily.  No recent level and we will obtain a lithium level  Resume lithium 300 mg HS  Counseled patient regarding potential benefits, risks, and side effects of lithium to include potential risk of lithium affecting thyroid and renal function.  Discussed need for periodic lab monitoring to determine drug level and to assess for potential adverse effects.  Counseled patient regarding signs and symptoms of lithium toxicity and advised that they notify office immediately or seek urgent medical attention if experiencing these signs and symptoms.  Patient advised to contact office with any questions or concerns.   FU 2 mos or sooner if available.  Lynder Parents, MD, DFAPA   Please see After Visit Summary for patient specific instructions.  No future appointments.   No orders of the defined types were placed in this encounter.     -------------------------------

## 2022-08-21 NOTE — Telephone Encounter (Signed)
Pt seen urgently today

## 2022-08-24 DIAGNOSIS — H53143 Visual discomfort, bilateral: Secondary | ICD-10-CM | POA: Diagnosis not present

## 2022-09-01 DIAGNOSIS — Z9484 Stem cells transplant status: Principal | ICD-10-CM

## 2022-09-01 MED ORDER — FLUDROCORTISONE 0.1 MG TABLET
ORAL_TABLET | 3 refills | 0 days
Start: 2022-09-01 — End: ?

## 2022-09-04 MED ORDER — OLANZAPINE 5 MG TABLET
ORAL_TABLET | Freq: Every evening | ORAL | 2 refills | 0 days
Start: 2022-09-04 — End: ?

## 2022-09-06 ENCOUNTER — Other Ambulatory Visit: Admit: 2022-09-06 | Discharge: 2022-09-06 | Payer: PRIVATE HEALTH INSURANCE

## 2022-09-06 ENCOUNTER — Ambulatory Visit: Admit: 2022-09-06 | Discharge: 2022-09-06 | Payer: PRIVATE HEALTH INSURANCE

## 2022-09-06 DIAGNOSIS — Z5181 Encounter for therapeutic drug level monitoring: Secondary | ICD-10-CM | POA: Diagnosis not present

## 2022-09-06 DIAGNOSIS — Z931 Gastrostomy status: Secondary | ICD-10-CM | POA: Diagnosis not present

## 2022-09-06 DIAGNOSIS — N1831 Chronic kidney disease, stage 3a: Secondary | ICD-10-CM | POA: Diagnosis not present

## 2022-09-06 DIAGNOSIS — D84822 Immunodeficiency due to external causes: Secondary | ICD-10-CM | POA: Diagnosis not present

## 2022-09-06 DIAGNOSIS — D61818 Other pancytopenia: Secondary | ICD-10-CM | POA: Diagnosis not present

## 2022-09-06 DIAGNOSIS — Z9484 Stem cells transplant status: Secondary | ICD-10-CM | POA: Diagnosis not present

## 2022-09-06 DIAGNOSIS — C9110 Chronic lymphocytic leukemia of B-cell type not having achieved remission: Secondary | ICD-10-CM | POA: Diagnosis not present

## 2022-09-06 DIAGNOSIS — I10 Essential (primary) hypertension: Secondary | ICD-10-CM | POA: Diagnosis not present

## 2022-09-06 MED ORDER — FLUDROCORTISONE 0.1 MG TABLET
ORAL_TABLET | ORAL | 5 refills | 28 days | Status: CP
Start: 2022-09-06 — End: ?

## 2022-09-15 MED ORDER — FLUDROCORTISONE 0.1 MG TABLET
ORAL_TABLET | ORAL | 3 refills | 94 days | Status: CP
Start: 2022-09-15 — End: ?

## 2022-09-23 MED ORDER — AMLODIPINE 10 MG TABLET
ORAL_TABLET | Freq: Every day | ORAL | 0 refills | 0 days
Start: 2022-09-23 — End: ?

## 2022-09-28 MED ORDER — AMLODIPINE 10 MG TABLET
ORAL_TABLET | Freq: Every day | ORAL | 0 refills | 90 days | Status: CP
Start: 2022-09-28 — End: ?

## 2022-09-28 MED ORDER — TRAZODONE 50 MG TABLET
ORAL_TABLET | 2 refills | 0 days | Status: CP
Start: 2022-09-28 — End: ?

## 2022-10-04 DIAGNOSIS — Z9484 Stem cells transplant status: Principal | ICD-10-CM

## 2022-10-04 MED ORDER — LEVOTHYROXINE 100 MCG TABLET
ORAL_TABLET | 1 refills | 0 days | Status: CP
Start: 2022-10-04 — End: ?

## 2022-10-10 ENCOUNTER — Ambulatory Visit: Admit: 2022-10-10 | Discharge: 2022-10-10 | Payer: PRIVATE HEALTH INSURANCE

## 2022-10-10 ENCOUNTER — Other Ambulatory Visit: Admit: 2022-10-10 | Discharge: 2022-10-10 | Payer: PRIVATE HEALTH INSURANCE

## 2022-10-10 DIAGNOSIS — Z5181 Encounter for therapeutic drug level monitoring: Secondary | ICD-10-CM | POA: Diagnosis not present

## 2022-10-10 DIAGNOSIS — D84822 Immunodeficiency due to external causes: Secondary | ICD-10-CM | POA: Diagnosis not present

## 2022-10-10 DIAGNOSIS — Z9484 Stem cells transplant status: Secondary | ICD-10-CM | POA: Diagnosis not present

## 2022-10-10 DIAGNOSIS — C9110 Chronic lymphocytic leukemia of B-cell type not having achieved remission: Secondary | ICD-10-CM | POA: Diagnosis not present

## 2022-10-10 DIAGNOSIS — Z23 Encounter for immunization: Secondary | ICD-10-CM | POA: Diagnosis not present

## 2022-10-10 MED ORDER — OLANZAPINE 5 MG TABLET
ORAL_TABLET | Freq: Every evening | ORAL | 11 refills | 15 days | Status: CP
Start: 2022-10-10 — End: 2023-10-10

## 2022-10-11 ENCOUNTER — Ambulatory Visit (INDEPENDENT_AMBULATORY_CARE_PROVIDER_SITE_OTHER): Payer: BC Managed Care – PPO | Admitting: Psychiatry

## 2022-10-11 ENCOUNTER — Encounter: Payer: Self-pay | Admitting: Psychiatry

## 2022-10-11 DIAGNOSIS — F4001 Agoraphobia with panic disorder: Secondary | ICD-10-CM | POA: Diagnosis not present

## 2022-10-11 DIAGNOSIS — F4310 Post-traumatic stress disorder, unspecified: Secondary | ICD-10-CM

## 2022-10-11 DIAGNOSIS — F3342 Major depressive disorder, recurrent, in full remission: Secondary | ICD-10-CM | POA: Diagnosis not present

## 2022-10-11 DIAGNOSIS — F5105 Insomnia due to other mental disorder: Secondary | ICD-10-CM

## 2022-10-11 NOTE — Progress Notes (Signed)
Meagan Walsh [place] [id number] 08/10/1960 62 y.o.    Subjective:   Patient ID:  Meagan Walsh is a 62 y.o. (DOB 16-Jan-1961) female.  Chief Complaint:  Chief Complaint  Patient presents with   Follow-up   Depression    Meagan Walsh presents to the office today for follow-up of bipolar depression.  seen in January 2020.  Mood was stable and no meds were changed.   seen May 2020.  Lithium level was 0.68.  Mood was stable and no meds were changed.  Discussed her care with her oncologist Dr. Benay Spice in June 2020.  She was dealing with pancytopenia and it was requested that she wean off lamotrigine to see if it was contributing.  That was done.  August 11, 2019 another conversation with Dr. Benay Spice.  She had elevated lithium of 2.1 on August 08, 2019.  She had lost some weight and was having some nausea and vomiting.  Lithium was held for a few days then restarted at 300 mg daily.  Lithium levels have been checked frequently since that time.  seen September 24, 2019.  She was not depressed but struggling with nausea and poor appetite and the following changes were made to see if it would help. In view of chronic nausea and weight loss despite med changes consider Trintellix.  Trintellix does cause nausea about 10% of people. It was tolerated in the past. Reduce Trintellix to 10 mg daily. Add mirtazapine 30 mg tablet at night which could help appetite and potentially replace Trintellix. If nausea is not resolved at follow up then stop Trintellix.  Call if depression worsens.  10/29/2019 appt noted: Did fine with med changes.  No problems with reduction in Trintellix.  Gained 11# since here.  At a good weight.  Wonders about stopping it for awhile. Good appetite.  No nausea now.  Still struggling with  CLL.  Needs bone marrow transplant but hasn't been able to occur yet and will be done in [place] with her living there for 3 mos.  Tired of being in limbo.  Rare Xanax used as nausea med.   Using Ambien at HS.  Sleep alright.   In spite of stress has not been depressed.  Patient reports stable mood and denies depressed or irritable moods.  Patient denies any recent difficulty with anxiety.  Patient denies difficulty with sleep initiation or maintenance with Ambien. Denies appetite disturbance.  No weight loss.  Patient reports that energy and motivation have been good.  Patient denies any difficulty with concentration.  Patient denies any suicidal ideation. Plan: Continue Trintellix to 10 mg daily. Continue or stop mirtazapine 30 mg tablet at night per her request. Weight loss resolved.  10/22/2020 appt noted: 2nd bone marrow transplant in [place] in Jan. Will be there at least a couple of more months. Pending lithium level from today. Lost sig weight this year about 108#.  Feeding tube. Restarted mirtazapine 30 HS about a week ago. Has had a lot of nausea.   Takes alprazolam this week DT panicky feelings like walls caving in.  Worry over wasting away.  Seemed to help her.  Last day or 2 only needed one.  Don't think I've been depressed, certainly not like in the past.  More like anxiety feeling trapped.  In general feels ok. Still on trintellix 10 HS, lithium 300 mg HS, Ambien 5-10 mg HS. Sleep is pretty good.  Now labs 3 times per week and adjusting antirejection med.  Tolerating psych meds as far as known bc no history of SE with psych meds.  03/25/2021 TC with oncology: Return telephone call to oncologist at [facility] Dr. Theodoro Clock. Reports that the patient is not eating well and has required a G-tube as well as a central line.  Dr. Theodoro Clock believes the patient has developed anorexia nervosa and is restricting not only calories but also fluids.  There is no other reasons for the gastric tube or further central line other than maintaining hydration and nutrition because she will not take in enough orally. The patient is no longer on mirtazapine but is still on Trintellix.   Suggested the oncologist restart the mirtazapine 30 mg nightly because it helped the patient gain weight before.  We will get her in to the office as soon as possible.  If in fact she does have symptoms of any her anorexia nervosa then she will need more intensive therapy.  Dr. Theodoro Clock believes she may need an inpatient anorexia nervosa program.  04/01/2021 appt noted: Covid 03/04/21.   She's aware oncology is concerned she might have anorexia.  Hard time gaining weight.  Full so fast and when try to push it.  When in hospital went so long without eating.  Has feeding tube and high calorie food gives her diarrhea. Doesn't remember why she stopped mirtazapine. "I'm too thin" and hate the way I look.  Doesn't want to lose more bc of health danger.  No thirst and relying on Pick line for hydration getting IV fluids daily.   No history of EDO when younger. Not depressed.  Not anxious unusuallly.  It is better now.  Sleep is OK and about 9 hours nightly.  Still takes Ambien to go to sleep.  04/15/21 appt noted: Mirtazapine helped appetite and eaten more.  Diarrhea 2 days and have been off.  Might have gained a little.  A little better with fluids but not great.  Pick line removed.  Put fluids in GI tube.   No problems with mirtazapine. Sometimes has diarrhea but not usually. Weigh herself.   08/21/2022 urgent appointment noted: Has not been seen in some time and had weaned off all psych meds.  Has become more urgently depressed. No psych meds except trazodone for sleep.  No other psych meds for a year.. Regained wt lost in chemo.  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  Still low WBC. Called since early January more blue and not getting out of house.  Got Covid 2 weeks ago and recovered.  No post-Covid fatigue. Stress son in crisis in [place] and needs to fly out to help.  Fears going back into depression and got scared.  Calling a therpist soon.  Too much free time.  No SI.  Thinks it's Ok  to resume lithium again as she stopped while in more intensive chemo.  Those medical concerns are no longer a problem.s   Sometimes THC gummy to help her feel calmer and sleep better prn.  2 times a month has taken alprazolam.   Appetite is oK Plan: Resume lithium 300 mg HS  10/11/22 appt noted: I feel really good.  Dr. Donzetta Sprung bone marrow doctor thinks benefit due to olanzapine 2.5 mg restarted early Feb after stopping it in Nov.  Was taking as an antinausea med. My lifestyle more conducive to not being depressed.   Now more active and got depressed after Covid. Meds: lithium 300 mg HS, olanzapine 2.5 mg HS, trazodone  100 mg HS Patient reports stable mood and denies depressed or irritable moods.  Patient denies any recent difficulty with anxiety.  Patient denies difficulty with sleep initiation or maintenance. Denies appetite disturbance.  Patient reports that energy and motivation have been good.  Patient denies any difficulty with concentration.  Patient denies any suicidal ideation.  2 severe episodes depression in life that last a full year.  Multiple failed prior medication trials include but are not limited to the following:   duloxetine, Viibryd,  Trintellix 20 mg failed until lithium was added Rexulti 0.5 mg caused restlessness, Abilify, Vraylar, Mirtazapine 30 helped appetite.  Review of Systems:  Review of Systems  Constitutional:  Positive for activity change. Negative for appetite change, fatigue and unexpected weight change.  Gastrointestinal:  Negative for diarrhea, nausea and vomiting.  Neurological:  Negative for tremors.  Psychiatric/Behavioral:  Negative for agitation, behavioral problems, confusion, decreased concentration, dysphoric mood, hallucinations, self-injury, sleep disturbance and suicidal ideas. The patient is not nervous/anxious and is not hyperactive.     Medications: I have reviewed the patient's current medications.  Current Outpatient Medications   Medication Sig Dispense Refill   acetaminophen (TYLENOL) 325 MG tablet Take 650 mg by mouth every 6 (six) hours as needed.     famotidine (PEPCID) 20 MG tablet Take 1 tablet (20 mg total) by mouth 2 (two) times daily. 60 tablet 2   folic acid (FOLVITE) 1 MG tablet TAKE 1 TABLET BY MOUTH EVERY DAY 90 tablet 0   levothyroxine (SYNTHROID) 100 MCG tablet TAKE 1 TABLET BY MOUTH EVERY DAY 30 tablet 2   lithium 300 MG tablet Take 1 tablet (300 mg total) by mouth at bedtime. 90 tablet 0   Magnesium 200 MG TABS Take 200 mg by mouth.     Maribavir 200 MG TABS Take by mouth in the morning and at bedtime.     OLANZapine (ZYPREXA) 2.5 MG tablet Take 2.5 mg by mouth at bedtime.     Polyethylene Glycol 3350 (MIRALAX PO) Take 17 g by mouth daily as needed.     prochlorperazine (COMPAZINE) 5 MG tablet TAKE 1 TO 2 TABLETS EVERY 6 HOURS AS NEEDED FOR NAUSEA OR VOMITING FOR UP TO 3 DAYS 60 tablet 1   tacrolimus (PROGRAF) 0.5 MG capsule Take 1.5 mg by mouth in the morning and at bedtime.     traZODone (DESYREL) 50 MG tablet Take 50 mg by mouth at bedtime. 2 TABS hs     ALPRAZolam (XANAX) 0.25 MG tablet TAKE 1 TO 2 TABLETS BY MOUTH EVERY DAY AS NEEDED FOR ANXIETY (Patient not taking: Reported on 10/11/2022) 60 tablet 2   No current facility-administered medications for this visit.   Facility-Administered Medications Ordered in Other Visits  Medication Dose Route Frequency Provider Last Rate Last Admin   0.9 %  sodium chloride infusion (Manually program via Guardrails IV Fluids)  250 mL Intravenous Once Ladell Pier, MD       0.9 %  sodium chloride infusion (Manually program via Guardrails IV Fluids)  250 mL Intravenous Once Ladell Pier, MD       heparin lock flush 100 unit/mL  250 Units Intracatheter PRN Ladell Pier, MD       sodium chloride flush (NS) 0.9 % injection 10 mL  10 mL Intracatheter PRN Ladell Pier, MD   10 mL at 02/21/19 0945   sodium chloride flush (NS) 0.9 % injection 10 mL  10 mL  Intracatheter PRN Ladell Pier,  MD       SUMAtriptan (IMITREX) nasal spray 20 mg  20 mg Nasal Once Ladell Pier, MD        Medication Side Effects: None  Allergies: No Known Allergies  Past Medical History:  Diagnosis Date   Anxiety    Cancer ([place])    CLL   Degenerative disk disease    Neck--chronic pain   Depression    Fibroids    Uterine   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   Hypothyroidism    Lymphocytosis    Mitral valve prolapse    Mitral valve prolapse    Thyroid disease    Hypothyroid    Family History  Problem Relation Age of Onset   Breast cancer Mother    Prostate cancer Father    Colon cancer Neg Hx    Esophageal cancer Neg Hx    Pancreatic cancer Neg Hx    Stomach cancer Neg Hx     Social History   Socioeconomic History   Marital status: Married    Spouse name: Not on file   Number of children: Not on file   Years of education: Not on file   Highest education level: Not on file  Occupational History   Not on file  Tobacco Use   Smoking status: Never   Smokeless tobacco: Never  Vaping Use   Vaping Use: Not on file  Substance and Sexual Activity   Alcohol use: Not Currently    Comment: 2 glasses of wine 2 times per week    Drug use: No   Sexual activity: Not on file  Other Topics Concern   Not on file  Social History Narrative   Pt lives in 2 story home with her husband   Has 3 adult children   Masters degree   Works at [employer]   Social Determinants of Radio broadcast assistant Strain: Not on Art therapist Insecurity: Not on file  Transportation Needs: Not on file  Physical Activity: Not on file  Stress: Not on file  Social Connections: Not on file  Intimate Partner Violence: Not on file    Past Medical History, Surgical history, Social history, and Family history were reviewed and updated as appropriate.   Please see review of systems for further details on the patient's review from  today.   Objective:   Physical Exam:  There were no vitals taken for this visit.  Physical Exam Constitutional:      General: She is not in acute distress.    Appearance: She is well-developed.  Musculoskeletal:        General: No deformity.  Neurological:     Mental Status: She is alert and oriented to person, place, and time.     Motor: No tremor.     Coordination: Coordination normal.     Gait: Gait normal.  Psychiatric:        Attention and Perception: Attention and perception normal.        Mood and Affect: Mood is depressed. Mood is not anxious. Affect is not labile, blunt, angry, tearful or inappropriate.        Speech: Speech normal. Speech is not slurred.        Behavior: Behavior normal.        Thought Content: Thought content normal. Thought content is not delusional. Thought content does not include homicidal or suicidal ideation. Thought content does not include suicidal plan.  Cognition and Memory: Cognition normal.        Judgment: Judgment normal.     Comments: Insight intact. No auditory or visual hallucinations. No delusions.      Lab Review:     Component Value Date/Time   NA 136 03/15/2022 1301   K 5.5 (H) 03/15/2022 1301   CL 109 03/15/2022 1301   CO2 20 (L) 03/15/2022 1301   GLUCOSE 113 (H) 03/15/2022 1301   BUN 69 (H) 03/15/2022 1301   CREATININE 2.70 (H) 03/15/2022 1301   CALCIUM 9.4 03/15/2022 1301   PROT 7.7 03/15/2022 1301   ALBUMIN 4.2 03/15/2022 1301   AST 25 03/15/2022 1301   ALT 30 03/15/2022 1301   ALKPHOS 68 03/15/2022 1301   BILITOT 0.3 03/15/2022 1301   GFRNONAA 19 (L) 03/15/2022 1301   GFRAA >60 12/17/2019 0830       Component Value Date/Time   WBC 3.9 (L) 03/15/2022 1301   WBC 9.9 03/31/2019 0845   RBC 3.03 (L) 03/15/2022 1301   HGB 9.6 (L) 03/15/2022 1301   HGB 14.0 06/27/2016 1038   HCT 29.4 (L) 03/15/2022 1301   HCT 42.0 06/27/2016 1038   PLT 114 (L) 03/15/2022 1301   PLT 205 06/27/2016 1038   MCV 97.0  03/15/2022 1301   MCV 92.9 06/27/2016 1038   MCH 31.7 03/15/2022 1301   MCHC 32.7 03/15/2022 1301   RDW 12.3 03/15/2022 1301   RDW 12.2 06/27/2016 1038   LYMPHSABS 2.1 03/15/2022 1301   LYMPHSABS 21.5 (H) 06/27/2016 1038   MONOABS 0.3 03/15/2022 1301   MONOABS 0.5 06/27/2016 1038   EOSABS 0.3 03/15/2022 1301   EOSABS 0.2 06/27/2016 1038   BASOSABS 0.0 03/15/2022 1301   BASOSABS 0.1 06/27/2016 1038    Lithium Lvl  Date Value Ref Range Status  12/11/2019 0.32 (L) 0.60 - 1.20 mmol/L Final    Comment:    Performed at [facility], [address] [place], [place] [id number]    Component 08/24/20 08/14/20 04/02/20 01/07/20  Lithium Lvl 0.4 0.6 0.8 0.6   on 300 mg daily  No results found for: "PHENYTOIN", "PHENOBARB", "VALPROATE", "CBMZ"   .res Assessment: Plan:    No diagnosis found.   Greater than 50% of 30 min video face to face time with patient was spent on counseling and coordination of care. We discussed Meagan Walsh has had history of 2 very severe prolonged depressions that failed to respond to multiple psychiatric medications but then responded to lithium potentiation.  She has been through recent winters with no difficulty.  She has a very strong seasonal pattern.  She previously failed to respond to several antidepressants including her last antidepressant which was Trintellix 20 mg until lithium 300 mg daily was added and she went into remission. She remained in remission through treatment for cancer during which time she had to stop all her psychiatric medications.  She has remained in remission until this January 2024 when she began to feel her depression recurring perhaps worsened by some problems that her son is having and was also after Covid.  Her cancer has remained in remission.  Her depression has resolved since she was here in January 2024 since resuming lithium 300 mg nightly.  However her cancer doctor also resumed olanzapine 2.5 mg nightly at  about the same time and there is a question as to which med change helped her.  Because of her high creatinine level we will stop the lithium in the hopes that the  olanzapine was what actually improved her mood.  She is currently free of depression. However we discussed there is a risk of recurrence of depression off the lithium and if that happens that we may have to resume the lithium or investigate other options.  The use of lithium is more risky given the elevated creatinine.  Component 08/24/20 08/14/20 04/02/20 01/07/20  Lithium Lvl 0.4 0.6 0.8 0.6  On 300 mg daily.  No recent level and we will obtain a lithium level  Hold lithium 300 mg HS DT kidney function, Cr 2.4 on 10/01/22.  The elevation is not new. Continue olanzapine 2.5 mg HS for mood Continue trazodone 100 HS Alprazolam prn   Call if recurrence of depression  FU 3 mos  Lynder Parents, MD, DFAPA   Please see After Visit Summary for patient specific instructions.  Future Appointments  Date Time Provider [place]  12/20/2022  2:00 PM Cottle, Billey Co., MD [facility]-CP None     No orders of the defined types were placed in this encounter.     -------------------------------

## 2022-10-19 DIAGNOSIS — Z9484 Stem cells transplant status: Principal | ICD-10-CM

## 2022-10-19 MED ORDER — LOKELMA 10 GRAM ORAL POWDER PACKET
PACK | 1 refills | 0 days
Start: 2022-10-19 — End: ?

## 2022-10-20 MED ORDER — SODIUM ZIRCONIUM CYCLOSILICATE 10 GRAM ORAL POWDER PACKET
PACK | 1 refills | 0 days | Status: CP
Start: 2022-10-20 — End: ?

## 2022-10-24 ENCOUNTER — Other Ambulatory Visit: Payer: Self-pay | Admitting: Obstetrics & Gynecology

## 2022-10-24 DIAGNOSIS — Z1231 Encounter for screening mammogram for malignant neoplasm of breast: Secondary | ICD-10-CM

## 2022-11-01 DIAGNOSIS — Z9484 Stem cells transplant status: Principal | ICD-10-CM

## 2022-11-01 MED ORDER — TACROLIMUS 0.5 MG CAPSULE, IMMEDIATE-RELEASE
ORAL_CAPSULE | ORAL | 2 refills | 30 days | Status: CP
Start: 2022-11-01 — End: ?

## 2022-11-04 MED ORDER — TRAZODONE 50 MG TABLET
ORAL_TABLET | ORAL | 2 refills | 0 days
Start: 2022-11-04 — End: ?

## 2022-11-06 MED ORDER — TRAZODONE 50 MG TABLET
ORAL_TABLET | ORAL | 2 refills | 0 days
Start: 2022-11-06 — End: ?

## 2022-11-14 ENCOUNTER — Other Ambulatory Visit: Admit: 2022-11-14 | Discharge: 2022-11-15 | Payer: PRIVATE HEALTH INSURANCE

## 2022-11-14 ENCOUNTER — Ambulatory Visit: Admit: 2022-11-14 | Discharge: 2022-11-15 | Payer: PRIVATE HEALTH INSURANCE

## 2022-11-14 DIAGNOSIS — I129 Hypertensive chronic kidney disease with stage 1 through stage 4 chronic kidney disease, or unspecified chronic kidney disease: Secondary | ICD-10-CM | POA: Diagnosis not present

## 2022-11-14 DIAGNOSIS — G4700 Insomnia, unspecified: Secondary | ICD-10-CM | POA: Diagnosis not present

## 2022-11-14 DIAGNOSIS — Z79899 Other long term (current) drug therapy: Secondary | ICD-10-CM | POA: Diagnosis not present

## 2022-11-14 DIAGNOSIS — N184 Chronic kidney disease, stage 4 (severe): Secondary | ICD-10-CM | POA: Diagnosis not present

## 2022-11-14 DIAGNOSIS — F5000 Anorexia nervosa, unspecified: Secondary | ICD-10-CM | POA: Diagnosis not present

## 2022-11-14 DIAGNOSIS — C9110 Chronic lymphocytic leukemia of B-cell type not having achieved remission: Secondary | ICD-10-CM | POA: Diagnosis not present

## 2022-11-14 DIAGNOSIS — D61818 Other pancytopenia: Secondary | ICD-10-CM | POA: Diagnosis not present

## 2022-11-14 DIAGNOSIS — D84822 Immunodeficiency due to external causes: Secondary | ICD-10-CM | POA: Diagnosis not present

## 2022-11-14 DIAGNOSIS — F32A Depression, unspecified: Secondary | ICD-10-CM | POA: Diagnosis not present

## 2022-11-14 DIAGNOSIS — Z8616 Personal history of COVID-19: Secondary | ICD-10-CM | POA: Diagnosis not present

## 2022-11-14 DIAGNOSIS — Z9484 Stem cells transplant status: Secondary | ICD-10-CM | POA: Diagnosis not present

## 2022-11-14 DIAGNOSIS — E039 Hypothyroidism, unspecified: Secondary | ICD-10-CM | POA: Diagnosis not present

## 2022-11-14 DIAGNOSIS — Z48290 Encounter for aftercare following bone marrow transplant: Secondary | ICD-10-CM | POA: Diagnosis not present

## 2022-11-14 DIAGNOSIS — F319 Bipolar disorder, unspecified: Secondary | ICD-10-CM | POA: Diagnosis not present

## 2022-11-14 DIAGNOSIS — Z79890 Hormone replacement therapy (postmenopausal): Secondary | ICD-10-CM | POA: Diagnosis not present

## 2022-11-18 ENCOUNTER — Other Ambulatory Visit: Payer: Self-pay | Admitting: Psychiatry

## 2022-11-18 DIAGNOSIS — F331 Major depressive disorder, recurrent, moderate: Secondary | ICD-10-CM

## 2022-11-19 NOTE — Telephone Encounter (Signed)
Hold lithium 300 mg HS DT kidney function, Cr 2.4 on 10/01/22.   Is she still taking?

## 2022-11-25 DIAGNOSIS — Z9484 Stem cells transplant status: Principal | ICD-10-CM

## 2022-11-25 MED ORDER — TACROLIMUS 0.5 MG CAPSULE, IMMEDIATE-RELEASE
ORAL_CAPSULE | ORAL | 1 refills | 0 days
Start: 2022-11-25 — End: ?

## 2022-11-28 MED ORDER — TACROLIMUS 0.5 MG CAPSULE, IMMEDIATE-RELEASE
ORAL_CAPSULE | ORAL | 1 refills | 90 days | Status: CP
Start: 2022-11-28 — End: ?

## 2022-12-05 ENCOUNTER — Ambulatory Visit
Admission: RE | Admit: 2022-12-05 | Discharge: 2022-12-05 | Disposition: A | Discharge status: Home / Self Care | Payer: BC Managed Care – PPO | Source: Ambulatory Visit | Attending: Obstetrics & Gynecology | Admitting: Obstetrics & Gynecology

## 2022-12-05 DIAGNOSIS — Z1231 Encounter for screening mammogram for malignant neoplasm of breast: Secondary | ICD-10-CM

## 2022-12-11 DIAGNOSIS — C9210 Chronic myeloid leukemia, BCR/ABL-positive, not having achieved remission: Secondary | ICD-10-CM | POA: Diagnosis not present

## 2022-12-11 DIAGNOSIS — N186 End stage renal disease: Secondary | ICD-10-CM | POA: Diagnosis not present

## 2022-12-11 DIAGNOSIS — E559 Vitamin D deficiency, unspecified: Secondary | ICD-10-CM | POA: Diagnosis not present

## 2022-12-11 DIAGNOSIS — D619 Aplastic anemia, unspecified: Secondary | ICD-10-CM | POA: Diagnosis not present

## 2022-12-14 MED ORDER — OLANZAPINE 5 MG TABLET
ORAL_TABLET | Freq: Every evening | ORAL | 2 refills | 30 days | Status: CP
Start: 2022-12-14 — End: ?

## 2022-12-18 DIAGNOSIS — Z9484 Stem cells transplant status: Principal | ICD-10-CM

## 2022-12-18 MED ORDER — SODIUM ZIRCONIUM CYCLOSILICATE 10 GRAM ORAL POWDER PACKET
PACK | 1 refills | 0 days
Start: 2022-12-18 — End: ?

## 2022-12-19 ENCOUNTER — Ambulatory Visit: Admit: 2022-12-19 | Discharge: 2022-12-19 | Payer: PRIVATE HEALTH INSURANCE

## 2022-12-19 ENCOUNTER — Other Ambulatory Visit: Admit: 2022-12-19 | Discharge: 2022-12-19 | Payer: PRIVATE HEALTH INSURANCE

## 2022-12-19 DIAGNOSIS — Z09 Encounter for follow-up examination after completed treatment for conditions other than malignant neoplasm: Secondary | ICD-10-CM | POA: Diagnosis not present

## 2022-12-19 DIAGNOSIS — N184 Chronic kidney disease, stage 4 (severe): Secondary | ICD-10-CM | POA: Diagnosis not present

## 2022-12-19 DIAGNOSIS — Z9484 Stem cells transplant status: Secondary | ICD-10-CM | POA: Diagnosis not present

## 2022-12-19 DIAGNOSIS — C9110 Chronic lymphocytic leukemia of B-cell type not having achieved remission: Secondary | ICD-10-CM | POA: Diagnosis not present

## 2022-12-19 DIAGNOSIS — D84822 Immunodeficiency due to external causes: Secondary | ICD-10-CM | POA: Diagnosis not present

## 2022-12-20 ENCOUNTER — Ambulatory Visit (INDEPENDENT_AMBULATORY_CARE_PROVIDER_SITE_OTHER): Payer: BC Managed Care – PPO | Admitting: Psychiatry

## 2022-12-20 ENCOUNTER — Encounter: Payer: Self-pay | Admitting: Psychiatry

## 2022-12-20 DIAGNOSIS — F4310 Post-traumatic stress disorder, unspecified: Secondary | ICD-10-CM | POA: Diagnosis not present

## 2022-12-20 DIAGNOSIS — F4001 Agoraphobia with panic disorder: Secondary | ICD-10-CM

## 2022-12-20 DIAGNOSIS — F3342 Major depressive disorder, recurrent, in full remission: Secondary | ICD-10-CM | POA: Diagnosis not present

## 2022-12-20 DIAGNOSIS — F5105 Insomnia due to other mental disorder: Secondary | ICD-10-CM

## 2022-12-20 MED ORDER — ALPRAZOLAM 0.25 MG PO TABS: ORAL_TABLET | ORAL | 2 refills | Status: DC

## 2022-12-20 MED ORDER — TACROLIMUS 0.5 MG CAPSULE, IMMEDIATE-RELEASE
ORAL_CAPSULE | Freq: Two times a day (BID) | ORAL | 1 refills | 30 days | Status: CP
Start: 2022-12-20 — End: ?

## 2022-12-20 MED ORDER — SODIUM ZIRCONIUM CYCLOSILICATE 10 GRAM ORAL POWDER PACKET
PACK | 1 refills | 0 days | Status: CP
Start: 2022-12-20 — End: ?

## 2022-12-20 NOTE — Progress Notes (Signed)
Reia Cahan [place] [id number] 1961/05/12 62 y.o.    Subjective:   Patient ID:  Meagan Walsh is a 62 y.o. (DOB 11/22/1960) female.  Chief Complaint:  Chief Complaint  Patient presents with   Follow-up    CHANDLER MANG presents to the office today for follow-up of bipolar depression.  seen in January 2020.  Mood was stable and no meds were changed.   seen May 2020.  Lithium level was 0.68.  Mood was stable and no meds were changed.  Discussed her care with her oncologist Dr. [name] in June 2020.  She was dealing with pancytopenia and it was requested that she wean off lamotrigine to see if it was contributing.  That was done.  August 11, 2019 another conversation with Dr. [name].  She had elevated lithium of 2.1 on August 08, 2019.  She had lost some weight and was having some nausea and vomiting.  Lithium was held for a few days then restarted at 300 mg daily.  Lithium levels have been checked frequently since that time.  seen September 24, 2019.  She was not depressed but struggling with nausea and poor appetite and the following changes were made to see if it would help. In view of chronic nausea and weight loss despite med changes consider Trintellix.  Trintellix does cause nausea about 10% of people. It was tolerated in the past. Reduce Trintellix to 10 mg daily. Add mirtazapine 30 mg tablet at night which could help appetite and potentially replace Trintellix. If nausea is not resolved at follow up then stop Trintellix.  Call if depression worsens.  10/29/2019 appt noted: Did fine with med changes.  No problems with reduction in Trintellix.  Gained 11# since here.  At a good weight.  Wonders about stopping it for awhile. Good appetite.  No nausea now.  Still struggling with  CLL.  Needs bone marrow transplant but hasn't been able to occur yet and will be done in [name] with her living there for 3 mos.  Tired of being in limbo.  Rare Xanax used as nausea med.  Using Ambien  at HS.  Sleep alright.   In spite of stress has not been depressed.  Patient reports stable mood and denies depressed or irritable moods.  Patient denies any recent difficulty with anxiety.  Patient denies difficulty with sleep initiation or maintenance with Ambien. Denies appetite disturbance.  No weight loss.  Patient reports that energy and motivation have been good.  Patient denies any difficulty with concentration.  Patient denies any suicidal ideation. Plan: Continue Trintellix to 10 mg daily. Continue or stop mirtazapine 30 mg tablet at night per her request. Weight loss resolved.  10/22/2020 appt noted: 2nd bone marrow transplant in [place] in Jan. Will be there at least a couple of more months. Pending lithium level from today. Lost sig weight this year about 108#.  Feeding tube. Restarted mirtazapine 30 HS about a week ago. Has had a lot of nausea.   Takes alprazolam this week DT panicky feelings like walls caving in.  Worry over wasting away.  Seemed to help her.  Last day or 2 only needed one.  Don't think I've been depressed, certainly not like in the past.  More like anxiety feeling trapped.  In general feels ok. Still on trintellix 10 HS, lithium 300 mg HS, Ambien 5-10 mg HS. Sleep is pretty good.  Now labs 3 times per week and adjusting antirejection med.   Tolerating psych meds  as far as known bc no history of SE with psych meds.  03/25/2021 TC with oncology: Return telephone call to oncologist at [facility] Dr. Catha Nottingham. Reports that the patient is not eating well and has required a G-tube as well as a central line.  Dr. Catha Nottingham believes the patient has developed anorexia nervosa and is restricting not only calories but also fluids.  There is no other reasons for the gastric tube or further central line other than maintaining hydration and nutrition because she will not take in enough orally. The patient is no longer on mirtazapine but is still on Trintellix.  Suggested the  oncologist restart the mirtazapine 30 mg nightly because it helped the patient gain weight before.  We will get her in to the office as soon as possible.  If in fact she does have symptoms of any her anorexia nervosa then she will need more intensive therapy.  Dr. Catha Nottingham believes she may need an inpatient anorexia nervosa program.  04/01/2021 appt noted: Covid 03/04/21.   She's aware oncology is concerned she might have anorexia.  Hard time gaining weight.  Full so fast and when try to push it.  When in hospital went so long without eating.  Has feeding tube and high calorie food gives her diarrhea. Doesn't remember why she stopped mirtazapine. "I'm too thin" and hate the way I look.  Doesn't want to lose more bc of health danger.  No thirst and relying on Pick line for hydration getting IV fluids daily.   No history of EDO when younger. Not depressed.  Not anxious unusuallly.  It is better now.  Sleep is OK and about 9 hours nightly.  Still takes Ambien to go to sleep.  04/15/21 appt noted: Mirtazapine helped appetite and eaten more.  Diarrhea 2 days and have been off.  Might have gained a little.  A little better with fluids but not great.  Pick line removed.  Put fluids in GI tube.   No problems with mirtazapine. Sometimes has diarrhea but not usually. Weigh herself.   08/21/2022 urgent appointment noted: Has not been seen in some time and had weaned off all psych meds.  Has become more urgently depressed. No psych meds except trazodone for sleep.  No other psych meds for a year.. Regained wt lost in chemo.  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  Still low WBC. Called since early January more blue and not getting out of house.  Got Covid 2 weeks ago and recovered.  No post-Covid fatigue. Stress son in crisis in [place] and needs to fly out to help.  Fears going back into depression and got scared.  Calling a therpist soon.  Too much free time.  No SI.  Thinks it's Ok to resume  lithium again as she stopped while in more intensive chemo.  Those medical concerns are no longer a problem.s   Sometimes THC gummy to help her feel calmer and sleep better prn.  2 times a month has taken alprazolam.   Appetite is oK Plan: Resume lithium 300 mg HS  10/11/22 appt noted: I feel really good.  Dr. Jorene Minors bone marrow doctor thinks benefit due to olanzapine 2.5 mg restarted early Feb after stopping it in Nov.  Was taking as an antinausea med. My lifestyle more conducive to not being depressed.   Now more active and got depressed after Covid. Meds: lithium 300 mg HS, olanzapine 2.5 mg HS, trazodone 100 mg HS  Patient reports stable mood and denies depressed or irritable moods.  Patient denies any recent difficulty with anxiety.  Patient denies difficulty with sleep initiation or maintenance. Denies appetite disturbance.  Patient reports that energy and motivation have been good.  Patient denies any difficulty with concentration.  Patient denies any suicidal ideation. Plan: Hold lithium 300 mg HS DT kidney function, Cr 2.4 on 10/01/22.  The elevation is not new. Continue olanzapine 2.5 mg HS for mood Continue trazodone 100 HS Alprazolam prn   12/20/22 appt noted: Psych med: olanzapine 2.5 mg taking at 8 PM, trazodone 50-100 mg HS prn.  No other antidepressant. Delta 9 gummies. No lithium for a couple of mos. Still trouble falling asleep.  Goes to bed 1030.   Not dep.  But not enough going on right now.   Misses kids in [place].  Able to travel now.  Helps. Still on some anti-rejection meds.   Stage 4 kidney dz.  Will be on transplant list.   2 severe episodes depression in life that last a full year.  Multiple failed prior medication trials include but are not limited to the following:   duloxetine, Viibryd,  Trintellix 20 mg failed until lithium was added Rexulti 0.5 mg caused restlessness, Abilify, Vraylar, Mirtazapine 30 helped appetite. Trazodone 100 mg HS Olanzapine 2.5 mg  HS  Twin boys 28 , D 30  Review of Systems:  Review of Systems  Constitutional:  Positive for activity change. Negative for appetite change, fatigue and unexpected weight change.  Gastrointestinal:  Negative for diarrhea, nausea and vomiting.  Neurological:  Negative for tremors.  Psychiatric/Behavioral:  Negative for agitation, behavioral problems, confusion, decreased concentration, dysphoric mood, hallucinations, self-injury, sleep disturbance and suicidal ideas. The patient is not nervous/anxious and is not hyperactive.     Medications: I have reviewed the patient's current medications.  Current Outpatient Medications  Medication Sig Dispense Refill   acetaminophen (TYLENOL) 325 MG tablet Take 650 mg by mouth every 6 (six) hours as needed.     famotidine (PEPCID) 20 MG tablet Take 1 tablet (20 mg total) by mouth 2 (two) times daily. 60 tablet 2   folic acid (FOLVITE) 1 MG tablet TAKE 1 TABLET BY MOUTH EVERY DAY 90 tablet 0   levothyroxine (SYNTHROID) 100 MCG tablet TAKE 1 TABLET BY MOUTH EVERY DAY 30 tablet 2   Magnesium 200 MG TABS Take 200 mg by mouth.     Maribavir 200 MG TABS Take by mouth in the morning and at bedtime.     OLANZapine (ZYPREXA) 2.5 MG tablet Take 2.5 mg by mouth at bedtime.     Polyethylene Glycol 3350 (MIRALAX PO) Take 17 g by mouth daily as needed.     prochlorperazine (COMPAZINE) 5 MG tablet TAKE 1 TO 2 TABLETS EVERY 6 HOURS AS NEEDED FOR NAUSEA OR VOMITING FOR UP TO 3 DAYS 60 tablet 1   tacrolimus (PROGRAF) 0.5 MG capsule Take 1.5 mg by mouth in the morning and at bedtime.     traZODone (DESYREL) 50 MG tablet Take 50 mg by mouth at bedtime. 2 TABS hs     ALPRAZolam (XANAX) 0.25 MG tablet TAKE 1 TO 2 TABLETS BY MOUTH EVERY DAY AS NEEDED FOR ANXIETY 60 tablet 2   No current facility-administered medications for this visit.   Facility-Administered Medications Ordered in Other Visits  Medication Dose Route Frequency Provider Last Rate Last Admin   0.9 %   sodium chloride infusion (Manually program via Guardrails IV Fluids)  250  mL Intravenous Once Thornton Papas B, MD       0.9 %  sodium chloride infusion (Manually program via Guardrails IV Fluids)  250 mL Intravenous Once Ladene Artist, MD       heparin lock flush 100 unit/mL  250 Units Intracatheter PRN Ladene Artist, MD       sodium chloride flush (NS) 0.9 % injection 10 mL  10 mL Intracatheter PRN Ladene Artist, MD   10 mL at 02/21/19 0945   sodium chloride flush (NS) 0.9 % injection 10 mL  10 mL Intracatheter PRN Ladene Artist, MD       SUMAtriptan (IMITREX) nasal spray 20 mg  20 mg Nasal Once Ladene Artist, MD        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   Hypothyroidism    Lymphocytosis    Mitral valve prolapse    Mitral valve prolapse    Thyroid disease    Hypothyroid    Family History  Problem Relation Age of Onset   Breast cancer Mother    Prostate cancer Father    Colon cancer Neg Hx    Esophageal cancer Neg Hx    Pancreatic cancer Neg Hx    Stomach cancer Neg Hx     Social History   Socioeconomic History   Marital status: Married    Spouse name: Not on file   Number of children: Not on file   Years of education: Not on file   Highest education level: Not on file  Occupational History   Not on file  Tobacco Use   Smoking status: Never   Smokeless tobacco: Never  Vaping Use   Vaping Use: Not on file  Substance and Sexual Activity   Alcohol use: Not Currently    Comment: 2 glasses of wine 2 times per week    Drug use: No   Sexual activity: Not on file  Other Topics Concern   Not on file  Social History Narrative   Pt lives in 2 story home with her husband   Has 3 adult children    Masters degree   Works at [employer]   Social Determinants of Corporate investment banker Strain: Not on Ship broker Insecurity: Not on file  Transportation Needs: Not on file  Physical Activity: Not on file  Stress: Not on file  Social Connections: Not on file  Intimate Partner Violence: Not on file    Past Medical History, Surgical history, Social history, and Family history were reviewed and updated as appropriate.   Please see review of systems for further details on the patient's review from today.   Objective:   Physical Exam:  There were no vitals taken for this visit.  Physical Exam Constitutional:      General: She is not in acute distress.    Appearance: She is well-developed.  Musculoskeletal:        General: No deformity.  Neurological:     Mental Status: She is alert and oriented to person, place, and time.     Motor: No tremor.     Coordination: Coordination normal.  Gait: Gait normal.  Psychiatric:        Attention and Perception: Attention and perception normal.        Mood and Affect: Mood is not anxious or depressed. Affect is not labile, blunt, angry, tearful or inappropriate.        Speech: Speech normal. Speech is not slurred.        Behavior: Behavior normal.        Thought Content: Thought content normal. Thought content is not delusional. Thought content does not include homicidal or suicidal ideation. Thought content does not include suicidal plan.        Cognition and Memory: Cognition normal.        Judgment: Judgment normal.     Comments: Insight intact. No auditory or visual hallucinations. No delusions.      Lab Review:     Component Value Date/Time   NA 136 03/15/2022 1301   K 5.5 (H) 03/15/2022 1301   CL 109 03/15/2022 1301   CO2 20 (L) 03/15/2022 1301   GLUCOSE 113 (H) 03/15/2022 1301   BUN 69 (H) 03/15/2022 1301   CREATININE 2.70 (H) 03/15/2022 1301   CALCIUM 9.4 03/15/2022 1301   PROT 7.7 03/15/2022 1301   ALBUMIN 4.2  03/15/2022 1301   AST 25 03/15/2022 1301   ALT 30 03/15/2022 1301   ALKPHOS 68 03/15/2022 1301   BILITOT 0.3 03/15/2022 1301   GFRNONAA 19 (L) 03/15/2022 1301   GFRAA >60 12/17/2019 0830       Component Value Date/Time   WBC 3.9 (L) 03/15/2022 1301   WBC 9.9 03/31/2019 0845   RBC 3.03 (L) 03/15/2022 1301   HGB 9.6 (L) 03/15/2022 1301   HGB 14.0 06/27/2016 1038   HCT 29.4 (L) 03/15/2022 1301   HCT 42.0 06/27/2016 1038   PLT 114 (L) 03/15/2022 1301   PLT 205 06/27/2016 1038   MCV 97.0 03/15/2022 1301   MCV 92.9 06/27/2016 1038   MCH 31.7 03/15/2022 1301   MCHC 32.7 03/15/2022 1301   RDW 12.3 03/15/2022 1301   RDW 12.2 06/27/2016 1038   LYMPHSABS 2.1 03/15/2022 1301   LYMPHSABS 21.5 (H) 06/27/2016 1038   MONOABS 0.3 03/15/2022 1301   MONOABS 0.5 06/27/2016 1038   EOSABS 0.3 03/15/2022 1301   EOSABS 0.2 06/27/2016 1038   BASOSABS 0.0 03/15/2022 1301   BASOSABS 0.1 06/27/2016 1038    Lithium Lvl  Date Value Ref Range Status  12/11/2019 0.32 (L) 0.60 - 1.20 mmol/L Final    Comment:    Performed at [facility], [address], [place], [place] [address]    Component 08/24/20 08/14/20 04/02/20 01/07/20  Lithium Lvl 0.4 0.6 0.8 0.6   on 300 mg daily  Component 12/19/22 11/14/22 10/10/22 09/06/22 08/08/22 07/14/22  Sodium 143 142 141 141 141 145  Potassium 4.2 4.3 4.0 4.2 5.2 High  4.8  Chloride 113 High  114 High  113 High  113 High  113 High  117 High   CO2 25.0 20.0 24.0 25.0 24.0 22.0  Anion Gap 5 8 4  Low  3 Low  4 Low  6  BUN 46 High  58 High  46 High  50 High  51 High  48 High   Creatinine 2.09 High  2.54 High  2.43 High  2.65 High  2.90 High  2.73 High   BUN/Creatinine Ratio 22 23 19 19 18 18   eGFR CKD-EPI (2021) Female 26 Low   21 Low   22  Low   20 Low   18 Low   19 Low      No results found for: "PHENYTOIN", "PHENOBARB", "VALPROATE", "CBMZ"   .res Assessment: Plan:    Depression, major, recurrent, in complete remission (HCC)  Panic  disorder with agoraphobia  PTSD (post-traumatic stress disorder)  Insomnia due to mental condition   Greater than 50% of 30 min video face to face time with patient was spent on counseling and coordination of care. We discussed Taigan has had history of 2 very severe prolonged depressions that failed to respond to multiple psychiatric medications but then responded to lithium potentiation.  She has been through recent winters with no difficulty.  She has a very strong seasonal pattern.  She previously failed to respond to several antidepressants including her last antidepressant which was Trintellix 20 mg until lithium 300 mg daily was added and she went into remission. She remained in remission through treatment for cancer during which time she had to stop all her psychiatric medications.  She has remained in remission until this January 2024 when she began to feel her depression recurring perhaps worsened by some problems that her son is having and was also after Covid.  Her cancer has remained in remission.  Her depression has resolved since she was here in January 2024 since resuming lithium 300 mg nightly.  However her cancer doctor also resumed olanzapine 2.5 mg nightly at about the same time and there is a question as to which med change helped her.  Because of her high creatinine level we will stop the lithium in the hopes that the olanzapine was what actually improved her mood.  She is currently free of depression. However we discussed there is a risk of recurrence of depression off the lithium and if that happens that we may have to resume the lithium or investigate other options.  The use of lithium is more risky given the elevated creatinine.  Component 08/24/20 08/14/20 04/02/20 01/07/20  Lithium Lvl 0.4 0.6 0.8 0.6   Component 12/19/22 11/14/22 10/10/22 09/06/22 08/08/22 07/14/22  Sodium 143 142 141 141 141 145  Potassium 4.2 4.3 4.0 4.2 5.2 High  4.8  Chloride 113 High  114 High   113 High  113 High  113 High  117 High   CO2 25.0 20.0 24.0 25.0 24.0 22.0  Anion Gap 5 8 4  Low  3 Low  4 Low  6  BUN 46 High  58 High  46 High  50 High  51 High  48 High   Creatinine 2.09 High  2.54 High  2.43 High  2.65 High  2.90 High  2.73 High   BUN/Creatinine Ratio 22 23 19 19 18 18   eGFR CKD-EPI (2021) Female 26 Low   21 Low   22 Low   20 Low   18 Low   19 Low     Hold lithium 300 mg HS DT kidney function, Cr 2.4 on 10/01/22.  The elevation is not new.  Continue olanzapine 2.5 mg HS for mood Continue trazodone 100 HS Alprazolam prn HS.    Call if recurrence of depression  FU 3 mos  Meredith Staggers, MD, DFAPA   Please see After Visit Summary for patient specific instructions.  No future appointments.    No orders of the defined types were placed in this encounter.     -------------------------------

## 2023-01-07 MED ORDER — OLANZAPINE 5 MG TABLET
ORAL_TABLET | Freq: Every evening | ORAL | 1 refills | 0 days
Start: 2023-01-07 — End: ?

## 2023-01-08 MED ORDER — OLANZAPINE 5 MG TABLET
ORAL_TABLET | Freq: Every evening | ORAL | 1 refills | 90 days | Status: CP
Start: 2023-01-08 — End: ?

## 2023-01-15 DIAGNOSIS — L814 Other melanin hyperpigmentation: Secondary | ICD-10-CM | POA: Diagnosis not present

## 2023-01-15 DIAGNOSIS — L821 Other seborrheic keratosis: Secondary | ICD-10-CM | POA: Diagnosis not present

## 2023-01-15 DIAGNOSIS — D225 Melanocytic nevi of trunk: Secondary | ICD-10-CM | POA: Diagnosis not present

## 2023-01-17 ENCOUNTER — Other Ambulatory Visit: Admit: 2023-01-17 | Discharge: 2023-01-17 | Payer: PRIVATE HEALTH INSURANCE

## 2023-01-17 ENCOUNTER — Ambulatory Visit: Admit: 2023-01-17 | Discharge: 2023-01-17 | Payer: PRIVATE HEALTH INSURANCE

## 2023-01-17 DIAGNOSIS — Z79899 Other long term (current) drug therapy: Secondary | ICD-10-CM | POA: Diagnosis not present

## 2023-01-17 DIAGNOSIS — N184 Chronic kidney disease, stage 4 (severe): Secondary | ICD-10-CM | POA: Diagnosis not present

## 2023-01-17 DIAGNOSIS — E039 Hypothyroidism, unspecified: Secondary | ICD-10-CM | POA: Diagnosis not present

## 2023-01-17 DIAGNOSIS — C9110 Chronic lymphocytic leukemia of B-cell type not having achieved remission: Secondary | ICD-10-CM | POA: Diagnosis not present

## 2023-01-17 DIAGNOSIS — C9111 Chronic lymphocytic leukemia of B-cell type in remission: Secondary | ICD-10-CM | POA: Diagnosis not present

## 2023-01-17 DIAGNOSIS — I10 Essential (primary) hypertension: Secondary | ICD-10-CM | POA: Diagnosis not present

## 2023-01-17 DIAGNOSIS — Z1152 Encounter for screening for COVID-19: Secondary | ICD-10-CM | POA: Diagnosis not present

## 2023-01-17 DIAGNOSIS — Z9484 Stem cells transplant status: Secondary | ICD-10-CM | POA: Diagnosis not present

## 2023-01-17 DIAGNOSIS — D84822 Immunodeficiency due to external causes: Secondary | ICD-10-CM | POA: Diagnosis not present

## 2023-01-17 DIAGNOSIS — Z9481 Bone marrow transplant status: Secondary | ICD-10-CM | POA: Diagnosis not present

## 2023-01-17 MED ORDER — TACROLIMUS 0.5 MG CAPSULE, IMMEDIATE-RELEASE
ORAL_CAPSULE | ORAL | 1 refills | 40 days | Status: CP
Start: 2023-01-17 — End: ?

## 2023-01-17 MED ORDER — AMLODIPINE 10 MG TABLET
ORAL_TABLET | Freq: Every day | ORAL | 0 refills | 90 days | Status: CP
Start: 2023-01-17 — End: ?

## 2023-01-17 MED ORDER — AZITHROMYCIN 250 MG TABLET
ORAL_TABLET | Freq: Every day | ORAL | 0 refills | 6 days | Status: CP
Start: 2023-01-17 — End: 2023-01-22

## 2023-01-18 MED ORDER — OSELTAMIVIR 75 MG CAPSULE
ORAL_CAPSULE | Freq: Two times a day (BID) | ORAL | 0 refills | 5 days | Status: CP
Start: 2023-01-18 — End: 2023-01-23

## 2023-01-19 DIAGNOSIS — Z7682 Awaiting organ transplant status: Secondary | ICD-10-CM | POA: Diagnosis not present

## 2023-01-19 DIAGNOSIS — Z1159 Encounter for screening for other viral diseases: Secondary | ICD-10-CM | POA: Diagnosis not present

## 2023-01-19 DIAGNOSIS — Z111 Encounter for screening for respiratory tuberculosis: Secondary | ICD-10-CM | POA: Diagnosis not present

## 2023-01-19 DIAGNOSIS — Z01818 Encounter for other preprocedural examination: Secondary | ICD-10-CM | POA: Diagnosis not present

## 2023-01-19 DIAGNOSIS — Z114 Encounter for screening for human immunodeficiency virus [HIV]: Secondary | ICD-10-CM | POA: Diagnosis not present

## 2023-01-29 ENCOUNTER — Telehealth: Payer: Self-pay | Admitting: Psychiatry

## 2023-01-29 ENCOUNTER — Other Ambulatory Visit: Payer: Self-pay | Admitting: Psychiatry

## 2023-01-29 DIAGNOSIS — F331 Major depressive disorder, recurrent, moderate: Secondary | ICD-10-CM

## 2023-01-29 DIAGNOSIS — F3342 Major depressive disorder, recurrent, in full remission: Secondary | ICD-10-CM

## 2023-01-29 MED ORDER — LITHIUM CARBONATE 150 MG PO CAPS
150.0000 mg | ORAL_CAPSULE | Freq: Every evening | ORAL | 0 refills | Status: DC
Start: 2023-01-29 — End: 2023-03-09

## 2023-01-29 NOTE — Telephone Encounter (Signed)
Pt called at 11:22a.  She said she is feeling depressed. She wanted to know from Dr Jennelle Human if she should start back on Lithium.  Next appt 12/2

## 2023-01-29 NOTE — Telephone Encounter (Signed)
Ok to resume lihtium but at a lower dose of 150 mg 1 daily.  I will send rx.  Should help in about 2 weeks.

## 2023-01-29 NOTE — Telephone Encounter (Signed)
Patient was seen 5/29.  Hold lithium 300 mg HS DT kidney function, Cr 2.4 on 10/01/22. The elevation is not new.    She is reporting increased depression, no energy and no motivation. 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   She is only prescribed alprazolam by you. Reports she only needs occasionally to help with sleep. Still taking the olanzapine and it is not always helpful for sleep. She is asking if she should go back on lithium.

## 2023-01-29 NOTE — Telephone Encounter (Signed)
Call to move up appt.

## 2023-01-30 ENCOUNTER — Telehealth: Payer: Self-pay | Admitting: Psychiatry

## 2023-01-30 NOTE — Telephone Encounter (Signed)
LVM @ 8:49a to call and schedule an earlier appt

## 2023-01-30 NOTE — Telephone Encounter (Signed)
Patient notified

## 2023-01-30 NOTE — Telephone Encounter (Signed)
Pharmacy sent PA for Lithium Carbonate 150mg  , see CMM

## 2023-01-31 NOTE — Telephone Encounter (Signed)
Pending response

## 2023-01-31 NOTE — Telephone Encounter (Signed)
Prior Approval received for Lithium Carbonate 150 mg #90/90 day, effective 01/31/2023-01/31/2024 with [employer]

## 2023-01-31 NOTE — Telephone Encounter (Signed)
LVM per DPR.  

## 2023-02-14 ENCOUNTER — Telehealth: Payer: Self-pay | Admitting: Psychiatry

## 2023-02-14 NOTE — Telephone Encounter (Signed)
Please see message.  From 7/8 TC:  Ok to resume lihtium but at a lower dose of 150 mg 1 daily. I will send rx. Should help in about 2 weeks.

## 2023-02-14 NOTE — Telephone Encounter (Signed)
I don't want to increase her lithium DT kidney concerns.  I need to see her.  Sched her Friday 7/26 at 1130 AM

## 2023-02-14 NOTE — Telephone Encounter (Signed)
Pt lvm @ 10a stating that she started Lithium 2 wks ago.  She said she is not feeling much better.  She wants to know if he will increase the dosage.  Next appt 12/2

## 2023-02-15 NOTE — Telephone Encounter (Signed)
Patient notified that Dr. Jennelle Human wants to see tomorrow at 11:30.

## 2023-02-16 ENCOUNTER — Encounter: Payer: Self-pay | Admitting: Psychiatry

## 2023-02-16 ENCOUNTER — Ambulatory Visit (INDEPENDENT_AMBULATORY_CARE_PROVIDER_SITE_OTHER): Payer: BC Managed Care – PPO | Admitting: Psychiatry

## 2023-02-16 ENCOUNTER — Other Ambulatory Visit: Payer: Self-pay | Admitting: Psychiatry

## 2023-02-16 DIAGNOSIS — F5105 Insomnia due to other mental disorder: Secondary | ICD-10-CM

## 2023-02-16 DIAGNOSIS — F331 Major depressive disorder, recurrent, moderate: Secondary | ICD-10-CM

## 2023-02-16 DIAGNOSIS — F4310 Post-traumatic stress disorder, unspecified: Secondary | ICD-10-CM | POA: Diagnosis not present

## 2023-02-16 DIAGNOSIS — F4001 Agoraphobia with panic disorder: Secondary | ICD-10-CM | POA: Diagnosis not present

## 2023-02-16 DIAGNOSIS — Z79899 Other long term (current) drug therapy: Secondary | ICD-10-CM

## 2023-02-16 MED ORDER — AUVELITY 45-105 MG PO TBCR
EXTENDED_RELEASE_TABLET | ORAL | 0 refills | Status: DC
Start: 2023-02-16 — End: 2023-03-09

## 2023-02-16 NOTE — Telephone Encounter (Signed)
Pt said she needs a PA for auvvelity 45 . She is desperate and want s to get the PA approved ASAP. If we have any samples she will come and pick them up

## 2023-02-16 NOTE — Patient Instructions (Signed)
Start Auvelity for depression, 1 in the AM for 1 week then 1 twice daily. Increase lithium to 300 mg or 2 of the 150 mg capsules. After 5-7 days get a lithium blood test in the morning at [facility]

## 2023-02-16 NOTE — Progress Notes (Signed)
Meagan Walsh [id number] 06/07/1961 62 y.o.    Subjective:   Patient ID:  Meagan Walsh is a 62 y.o. (DOB 08/20/1960) female.  Chief Complaint:  Chief Complaint  Patient presents with   Follow-up   Depression   Anxiety    Meagan Walsh presents to the office today for follow-up of bipolar depression.  seen in January 2020.  Mood was stable and no meds were changed.   seen May 2020.  Lithium level was 0.68.  Mood was stable and no meds were changed.  Discussed her care with her oncologist Dr. [name] in June 2020.  She was dealing with pancytopenia and it was requested that she wean off lamotrigine to see if it was contributing.  That was done.  August 11, 2019 another conversation with Dr. [name].  She had elevated lithium of 2.1 on August 08, 2019.  She had lost some weight and was having some nausea and vomiting.  Lithium was held for a few days then restarted at 300 mg daily.  Lithium levels have been checked frequently since that time.  seen September 24, 2019.  She was not depressed but struggling with nausea and poor appetite and the following changes were made to see if it would help. In view of chronic nausea and weight loss despite med changes consider Trintellix.  Trintellix does cause nausea about 10% of people. It was tolerated in the past. Reduce Trintellix to 10 mg daily. Add mirtazapine 30 mg tablet at night which could help appetite and potentially replace Trintellix. If nausea is not resolved at follow up then stop Trintellix.  Call if depression worsens.  10/29/2019 appt noted: Did fine with med changes.  No problems with reduction in Trintellix.  Gained 11# since here.  At a good weight.  Wonders about stopping it for awhile. Good appetite.  No nausea now.  Still struggling with  CLL.  Needs bone marrow transplant but hasn't been able to occur yet and will be done in [place] with her living there for 3 mos.  Tired of being in limbo.  Rare Xanax used as  nausea med.  Using Ambien at HS.  Sleep alright.   In spite of stress has not been depressed.  Patient reports stable mood and denies depressed or irritable moods.  Patient denies any recent difficulty with anxiety.  Patient denies difficulty with sleep initiation or maintenance with Ambien. Denies appetite disturbance.  No weight loss.  Patient reports that energy and motivation have been good.  Patient denies any difficulty with concentration.  Patient denies any suicidal ideation. Plan: Continue Trintellix to 10 mg daily. Continue or stop mirtazapine 30 mg tablet at night per her request. Weight loss resolved.  10/22/2020 appt noted: 2nd bone marrow transplant in [place] in Jan. Will be there at least a couple of more months. Pending lithium level from today. Lost sig weight this year about 108#.  Feeding tube. Restarted mirtazapine 30 HS about a week ago. Has had a lot of nausea.   Takes alprazolam this week DT panicky feelings like walls caving in.  Worry over wasting away.  Seemed to help her.  Last day or 2 only needed one.  Don't think I've been depressed, certainly not like in the past.  More like anxiety feeling trapped.  In general feels ok. Still on trintellix 10 HS, lithium 300 mg HS, Ambien 5-10 mg HS. Sleep is pretty good.  Now labs 3 times per week and adjusting antirejection  med.   Tolerating psych meds as far as known bc no history of SE with psych meds.  03/25/2021 TC with oncology: Return telephone call to oncologist at [facility] Dr. Catha Nottingham. Reports that the patient is not eating well and has required a G-tube as well as a central line.  Dr. Catha Nottingham believes the patient has developed anorexia nervosa and is restricting not only calories but also fluids.  There is no other reasons for the gastric tube or further central line other than maintaining hydration and nutrition because she will not take in enough orally. The patient is no longer on mirtazapine but is still on  Trintellix.  Suggested the oncologist restart the mirtazapine 30 mg nightly because it helped the patient gain weight before.  We will get her in to the office as soon as possible.  If in fact she does have symptoms of any her anorexia nervosa then she will need more intensive therapy.  Dr. Catha Nottingham believes she may need an inpatient anorexia nervosa program.  04/01/2021 appt noted: Covid 03/04/21.   She's aware oncology is concerned she might have anorexia.  Hard time gaining weight.  Full so fast and when try to push it.  When in hospital went so long without eating.  Has feeding tube and high calorie food gives her diarrhea. Doesn't remember why she stopped mirtazapine. "I'm too thin" and hate the way I look.  Doesn't want to lose more bc of health danger.  No thirst and relying on Pick line for hydration getting IV fluids daily.   No history of EDO when younger. Not depressed.  Not anxious unusuallly.  It is better now.  Sleep is OK and about 9 hours nightly.  Still takes Ambien to go to sleep.  04/15/21 appt noted: Mirtazapine helped appetite and eaten more.  Diarrhea 2 days and have been off.  Might have gained a little.  A little better with fluids but not great.  Pick line removed.  Put fluids in GI tube.   No problems with mirtazapine. Sometimes has diarrhea but not usually. Weigh herself.   08/21/2022 urgent appointment noted: Has not been seen in some time and had weaned off all psych meds.  Has become more urgently depressed. No psych meds except trazodone for sleep.  No other psych meds for a year.. Regained wt lost in chemo.  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  Still low WBC. Called since early January more blue and not getting out of house.  Got Covid 2 weeks ago and recovered.  No post-Covid fatigue. Stress son in crisis in [place] and needs to fly out to help.  Fears going back into depression and got scared.  Calling a therpist soon.  Too much free time.  No SI.   Thinks it's Ok to resume lithium again as she stopped while in more intensive chemo.  Those medical concerns are no longer a problem.s   Sometimes THC gummy to help her feel calmer and sleep better prn.  2 times a month has taken alprazolam.   Appetite is oK Plan: Resume lithium 300 mg HS  10/11/22 appt noted: I feel really good.  Dr. Jorene Minors bone marrow doctor thinks benefit due to olanzapine 2.5 mg restarted early Feb after stopping it in Nov.  Was taking as an antinausea med. My lifestyle more conducive to not being depressed.   Now more active and got depressed after Covid. Meds: lithium 300 mg HS, olanzapine 2.5  mg HS, trazodone 100 mg HS Patient reports stable mood and denies depressed or irritable moods.  Patient denies any recent difficulty with anxiety.  Patient denies difficulty with sleep initiation or maintenance. Denies appetite disturbance.  Patient reports that energy and motivation have been good.  Patient denies any difficulty with concentration.  Patient denies any suicidal ideation. Plan: Hold lithium 300 mg HS DT kidney function, Cr 2.4 on 10/01/22.  The elevation is not new. Continue olanzapine 2.5 mg HS for mood Continue trazodone 100 HS Alprazolam prn   12/20/22 appt noted: Psych med: olanzapine 2.5 mg taking at 8 PM, trazodone 50-100 mg HS prn.  No other antidepressant. Delta 9 gummies. No lithium for a couple of mos. Still trouble falling asleep.  Goes to bed 1030.   Not dep.  But not enough going on right now.   Misses kids in [place].  Able to travel now.  Helps. Still on some anti-rejection meds.   Stage 4 kidney dz.  Will be on transplant list. Plan: no changes  02/14/23 TC resumed lithium 150 mg daily for 2 weeks without benefit.  Wonders about increasing the dose.   Urgent appt sched instead Feels scared of going into deep dep again.  Getting worse. Started after flu and had to stay in the house a couple of weeks and then the dep began coming back.  Started 19  June.   For a couple of mos isolated more and bored.  Not busy enough.  Somewhat down over kids in [place] and misses them.  02/16/23 urgent appt noted: Feels scared of going into deep dep again.  Getting worse. Started after flu and had to stay in the house a couple of weeks and then the dep began coming back.  Started 19 June.   For a couple of mos isolated more and bored.  Not busy enough.  Somewhat down over kids in [place] and misses them. Anxiety ridden terrified depression.   Not eating great since dep.  Not sleeping well either.  Lie there and ruminate.    2 severe episodes depression in life that last a full year.  Multiple failed prior medication trials include but are not limited to the following:   duloxetine, Viibryd,  Trintellix 20 mg failed until lithium was added Rexulti 0.5 mg caused restlessness, Abilify, Vraylar, Mirtazapine 30 helped appetite. Trazodone 100 mg HS Olanzapine 2.5 mg HS  Twin boys 28 , D 30  Review of Systems:  Review of Systems  Constitutional:  Positive for activity change. Negative for appetite change, fatigue and unexpected weight change.  Gastrointestinal:  Negative for abdominal pain, diarrhea, nausea and vomiting.  Neurological:  Negative for tremors.  Psychiatric/Behavioral:  Positive for dysphoric mood. Negative for agitation, behavioral problems, confusion, decreased concentration, hallucinations, self-injury, sleep disturbance and suicidal ideas. The patient is not nervous/anxious and is not hyperactive.     Medications: I have reviewed the patient's current medications.  Current Outpatient Medications  Medication Sig Dispense Refill   acetaminophen (TYLENOL) 325 MG tablet Take 650 mg by mouth every 6 (six) hours as needed.     ALPRAZolam (XANAX) 0.25 MG tablet TAKE 1 TO 2 TABLETS BY MOUTH EVERY DAY AS NEEDED FOR ANXIETY 60 tablet 2   Dextromethorphan-buPROPion ER (AUVELITY) 45-105 MG TBCR 1 tablet in the AM for 1 week then 1 twice daily 60 tablet  0   famotidine (PEPCID) 20 MG tablet Take 1 tablet (20 mg total) by mouth 2 (two) times daily. 60 tablet  2   folic acid (FOLVITE) 1 MG tablet TAKE 1 TABLET BY MOUTH EVERY DAY 90 tablet 0   levothyroxine (SYNTHROID) 100 MCG tablet TAKE 1 TABLET BY MOUTH EVERY DAY 30 tablet 2   lithium carbonate 150 MG capsule Take 1 capsule (150 mg total) by mouth at bedtime. 90 capsule 0   Magnesium 200 MG TABS Take 200 mg by mouth.     Maribavir 200 MG TABS Take by mouth in the morning and at bedtime.     OLANZapine (ZYPREXA) 2.5 MG tablet Take 2.5 mg by mouth at bedtime.     Polyethylene Glycol 3350 (MIRALAX PO) Take 17 g by mouth daily as needed.     prochlorperazine (COMPAZINE) 5 MG tablet TAKE 1 TO 2 TABLETS EVERY 6 HOURS AS NEEDED FOR NAUSEA OR VOMITING FOR UP TO 3 DAYS 60 tablet 1   tacrolimus (PROGRAF) 0.5 MG capsule Take 1.5 mg by mouth in the morning and at bedtime.     traZODone (DESYREL) 50 MG tablet Take 50 mg by mouth at bedtime. 2 TABS hs     No current facility-administered medications for this visit.   Facility-Administered Medications Ordered in Other Visits  Medication Dose Route Frequency Provider Last Rate Last Admin   0.9 %  sodium chloride infusion (Manually program via Guardrails IV Fluids)  250 mL Intravenous Once Ladene Artist, MD       0.9 %  sodium chloride infusion (Manually program via Guardrails IV Fluids)  250 mL Intravenous Once Ladene Artist, MD       heparin lock flush 100 unit/mL  250 Units Intracatheter PRN Ladene Artist, MD       sodium chloride flush (NS) 0.9 % injection 10 mL  10 mL Intracatheter PRN Ladene Artist, MD   10 mL at 02/21/19 0945   sodium chloride flush (NS) 0.9 % injection 10 mL  10 mL Intracatheter PRN Ladene Artist, MD       SUMAtriptan (IMITREX) nasal spray 20 mg  20 mg Nasal Once Ladene Artist, MD        Medication Side Effects: None  Allergies: No Known Allergies  Past Medical History:  Diagnosis Date   Anxiety    Cancer  (HCC)    CLL   Degenerative disk disease    Neck--chronic pain   Depression    Fibroids    Uterine   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   Hypothyroidism    Lymphocytosis    Mitral valve prolapse    Mitral valve prolapse    Thyroid disease    Hypothyroid    Family History  Problem Relation Age of Onset   Breast cancer Mother    Prostate cancer Father    Colon cancer Neg Hx    Esophageal cancer Neg Hx    Pancreatic cancer Neg Hx    Stomach cancer Neg Hx     Social History   Socioeconomic History   Marital status: Married    Spouse name: Not on file   Number of children: Not on file   Years of education: Not on file   Highest education level: Not on file  Occupational History   Not on file  Tobacco Use   Smoking status: Never   Smokeless tobacco: Never  Vaping Use   Vaping status: Not on file  Substance and Sexual Activity   Alcohol use: Not Currently    Comment: 2 glasses  of wine 2 times per week    Drug use: No   Sexual activity: Not on file  Other Topics Concern   Not on file  Social History Narrative   Pt lives in 2 story home with her husband   Has 3 adult children   Masters degree   Works at [employer]   Social Determinants of Health   Financial Resource Strain: Low Risk  (09/25/2020)   Received from [facility], [facility]   Overall Financial Resource Strain (CARDIA)    Difficulty of Paying Living Expenses: Not very hard  Food Insecurity: No Food Insecurity (09/25/2020)   Received from [facility], [facility]   Hunger Vital Sign    Worried About Running Out of Food in the Last Year: Never true    Ran Out of Food in the Last Year: Never true  Transportation Needs: No Transportation Needs (09/25/2020)   Received from [facility], [facility]   PRAPARE - Transportation    Lack of Transportation (Medical): No    Lack of Transportation (Non-Medical): No  Physical Activity: Not on file  Stress:  Not on file  Social Connections: Not on file  Intimate Partner Violence: Not on file    Past Medical History, Surgical history, Social history, and Family history were reviewed and updated as appropriate.   Please see review of systems for further details on the patient's review from today.   Objective:   Physical Exam:  There were no vitals taken for this visit.  Physical Exam Constitutional:      General: She is not in acute distress.    Appearance: She is well-developed.  Musculoskeletal:        General: No deformity.  Neurological:     Mental Status: She is alert and oriented to person, place, and time.     Motor: No tremor.     Coordination: Coordination normal.     Gait: Gait normal.  Psychiatric:        Attention and Perception: Attention and perception normal.        Mood and Affect: Mood is anxious and depressed. Affect is tearful. Affect is not labile, blunt, angry or inappropriate.        Speech: Speech normal. Speech is not slurred.        Behavior: Behavior normal.        Thought Content: Thought content normal. Thought content is not delusional. Thought content does not include homicidal or suicidal ideation. Thought content does not include suicidal plan.        Cognition and Memory: Cognition normal.        Judgment: Judgment normal.     Comments: Insight intact. No auditory or visual hallucinations. No delusions.     Lab Review:     Component Value Date/Time   NA 136 03/15/2022 1301   K 5.5 (H) 03/15/2022 1301   CL 109 03/15/2022 1301   CO2 20 (L) 03/15/2022 1301   GLUCOSE 113 (H) 03/15/2022 1301   BUN 69 (H) 03/15/2022 1301   CREATININE 2.70 (H) 03/15/2022 1301   CALCIUM 9.4 03/15/2022 1301   PROT 7.7 03/15/2022 1301   ALBUMIN 4.2 03/15/2022 1301   AST 25 03/15/2022 1301   ALT 30 03/15/2022 1301   ALKPHOS 68 03/15/2022 1301   BILITOT 0.3 03/15/2022 1301   GFRNONAA 19 (L) 03/15/2022 1301   GFRAA >60 12/17/2019 0830       Component Value  Date/Time  WBC 3.9 (L) 03/15/2022 1301   WBC 9.9 03/31/2019 0845   RBC 3.03 (L) 03/15/2022 1301   HGB 9.6 (L) 03/15/2022 1301   HGB 14.0 06/27/2016 1038   HCT 29.4 (L) 03/15/2022 1301   HCT 42.0 06/27/2016 1038   PLT 114 (L) 03/15/2022 1301   PLT 205 06/27/2016 1038   MCV 97.0 03/15/2022 1301   MCV 92.9 06/27/2016 1038   MCH 31.7 03/15/2022 1301   MCHC 32.7 03/15/2022 1301   RDW 12.3 03/15/2022 1301   RDW 12.2 06/27/2016 1038   LYMPHSABS 2.1 03/15/2022 1301   LYMPHSABS 21.5 (H) 06/27/2016 1038   MONOABS 0.3 03/15/2022 1301   MONOABS 0.5 06/27/2016 1038   EOSABS 0.3 03/15/2022 1301   EOSABS 0.2 06/27/2016 1038   BASOSABS 0.0 03/15/2022 1301   BASOSABS 0.1 06/27/2016 1038    Lithium Lvl  Date Value Ref Range Status  12/11/2019 0.32 (L) 0.60 - 1.20 mmol/L Final    Comment:    Performed at [facility], [address], [place], [place] [address]    Component 08/24/20 08/14/20 04/02/20 01/07/20  Lithium Lvl 0.4 0.6 0.8 0.6   on 300 mg daily  Component 12/19/22 11/14/22 10/10/22 09/06/22 08/08/22 07/14/22  Sodium 143 142 141 141 141 145  Potassium 4.2 4.3 4.0 4.2 5.2 High  4.8  Chloride 113 High  114 High  113 High  113 High  113 High  117 High   CO2 25.0 20.0 24.0 25.0 24.0 22.0  Anion Gap 5 8 4  Low  3 Low  4 Low  6  BUN 46 High  58 High  46 High  50 High  51 High  48 High   Creatinine 2.09 High  2.54 High  2.43 High  2.65 High  2.90 High  2.73 High   BUN/Creatinine Ratio 22 23 19 19 18 18   eGFR CKD-EPI (2021) Female 26 Low   21 Low   22 Low   20 Low   18 Low   19 Low      No results found for: "PHENYTOIN", "PHENOBARB", "VALPROATE", "CBMZ"   .res Assessment: Plan:    Major depressive disorder, recurrent episode, moderate (HCC) - Plan: Lithium level, Basic metabolic panel, Dextromethorphan-buPROPion ER (AUVELITY) 45-105 MG TBCR  Panic disorder with agoraphobia  PTSD (post-traumatic stress disorder)  Insomnia due to mental condition  Lithium  use - Plan: Lithium level, Basic metabolic panel   30 min face to face time with patient was spent on counseling and oordination of care. We discussed Lynzi has had history of 2 very severe prolonged depressions that failed to respond to multiple psychiatric medications but then responded to lithium potentiation.  She has been through recent winters with no difficulty.  She has a very strong seasonal pattern.  She previously failed to respond to several antidepressants including her last antidepressant which was Trintellix 20 mg until lithium 300 mg daily was added and she went into remission. She remained in remission through treatment for cancer during which time she had to stop all her psychiatric medications.  She has remained in remission until this January 2024 .[name]  Her cancer has remained in remission.  Her depression resolved again since she was here in January 2024 since resuming lithium 300 mg nightly.  However her cancer doctor also resumed olanzapine 2.5 mg nightly at about the same time and there is a question as to which med change helped her.  Because of her high creatinine level we stopped  the lithium in the hopes that the olanzapine was what actually improved her mood.   However dep relapse in the last several weeks. Disc options including resuming lithium.  The use of lithium is more risky given the elevated creatinine.  Disc again SS lithium toxicity and this risk greater given kidney status.    Best options given his TRD novel AD Auvelity or resuming lithium.  Given the severity of her sx and how desperate she feels to get better soon, will do both.  Lithium has helped in the past.   Component 08/24/20 08/14/20 04/02/20 01/07/20  Lithium Lvl 0.4 0.6 0.8 0.6   Component 12/19/22 11/14/22 10/10/22 09/06/22 08/08/22 07/14/22  Sodium 143 142 141 141 141 145  Potassium 4.2 4.3 4.0 4.2 5.2 High  4.8  Chloride 113 High  114 High  113 High  113 High  113 High  117 High   CO2 25.0  20.0 24.0 25.0 24.0 22.0  Anion Gap 5 8 4  Low  3 Low  4 Low  6  BUN 46 High  58 High  46 High  50 High  51 High  48 High   Creatinine 2.09 High  2.54 High  2.43 High  2.65 High  2.90 High  2.73 High   BUN/Creatinine Ratio 22 23 19 19 18 18   eGFR CKD-EPI (2021) Female 26 Low   21 Low   22 Low   20 Low   18 Low   19 Low     Cr 2.4 on 10/01/22.  The elevation is not new.  Continue olanzapine 2.5 mg HS for mood Continue trazodone 100 HS Alprazolam prn HS.   Start Auvelity for depression, 1 in the AM for 1 week then 1 twice daily. Increase lithium to 300 mg or 2 of the 150 mg capsules. This worked before. After 5-7 days get a lithium blood test in the morning at [facility]  Call if recurrence of depression  FU 1 mos  Meredith Staggers, MD, DFAPA   Please see After Visit Summary for patient specific instructions.  Future Appointments  Date Time Provider Department Center  03/09/2023 11:00 AM Cottle, Steva Ready., MD [facility] None  06/25/2023  2:00 PM Cottle, Steva Ready., MD [facility] None      Orders Placed This Encounter  Procedures   Lithium level   Basic metabolic panel      -------------------------------

## 2023-02-16 NOTE — Telephone Encounter (Signed)
Prior Authorization initiated with [employer] for AUVELITY 45-105 MG, #60.

## 2023-02-17 NOTE — Telephone Encounter (Signed)
This PA was approved.

## 2023-02-19 ENCOUNTER — Telehealth: Payer: Self-pay

## 2023-02-19 NOTE — Telephone Encounter (Signed)
Prior Authorization submitted and approved for Auvelity 45-105 mg effective 02/16/2023-02/16/2024 with [employer] [place]

## 2023-02-20 ENCOUNTER — Other Ambulatory Visit: Admit: 2023-02-20 | Discharge: 2023-02-21 | Payer: PRIVATE HEALTH INSURANCE

## 2023-02-20 ENCOUNTER — Ambulatory Visit: Admit: 2023-02-20 | Discharge: 2023-02-21 | Payer: PRIVATE HEALTH INSURANCE

## 2023-02-20 DIAGNOSIS — Z9484 Stem cells transplant status: Secondary | ICD-10-CM | POA: Diagnosis not present

## 2023-02-20 DIAGNOSIS — Z79890 Hormone replacement therapy (postmenopausal): Secondary | ICD-10-CM | POA: Diagnosis not present

## 2023-02-20 DIAGNOSIS — D84822 Immunodeficiency due to external causes: Secondary | ICD-10-CM | POA: Diagnosis not present

## 2023-02-20 DIAGNOSIS — C9110 Chronic lymphocytic leukemia of B-cell type not having achieved remission: Secondary | ICD-10-CM | POA: Diagnosis not present

## 2023-02-20 DIAGNOSIS — Z5181 Encounter for therapeutic drug level monitoring: Secondary | ICD-10-CM | POA: Diagnosis not present

## 2023-02-20 DIAGNOSIS — G40409 Other generalized epilepsy and epileptic syndromes, not intractable, without status epilepticus: Secondary | ICD-10-CM | POA: Diagnosis not present

## 2023-02-20 DIAGNOSIS — I6783 Posterior reversible encephalopathy syndrome: Secondary | ICD-10-CM | POA: Diagnosis not present

## 2023-02-20 DIAGNOSIS — I129 Hypertensive chronic kidney disease with stage 1 through stage 4 chronic kidney disease, or unspecified chronic kidney disease: Secondary | ICD-10-CM | POA: Diagnosis not present

## 2023-02-20 DIAGNOSIS — N184 Chronic kidney disease, stage 4 (severe): Secondary | ICD-10-CM | POA: Diagnosis not present

## 2023-02-20 DIAGNOSIS — Z931 Gastrostomy status: Secondary | ICD-10-CM | POA: Diagnosis not present

## 2023-02-20 DIAGNOSIS — Z79899 Other long term (current) drug therapy: Secondary | ICD-10-CM | POA: Diagnosis not present

## 2023-02-20 DIAGNOSIS — F32A Depression, unspecified: Secondary | ICD-10-CM | POA: Diagnosis not present

## 2023-02-20 DIAGNOSIS — Z9481 Bone marrow transplant status: Principal | ICD-10-CM

## 2023-02-20 MED ORDER — TACROLIMUS 0.5 MG CAPSULE, IMMEDIATE-RELEASE
ORAL_CAPSULE | Freq: Two times a day (BID) | ORAL | 1 refills | 60 days | Status: CP
Start: 2023-02-20 — End: ?

## 2023-02-21 DIAGNOSIS — Z79899 Other long term (current) drug therapy: Secondary | ICD-10-CM | POA: Diagnosis not present

## 2023-02-21 DIAGNOSIS — F331 Major depressive disorder, recurrent, moderate: Secondary | ICD-10-CM | POA: Diagnosis not present

## 2023-02-22 DIAGNOSIS — Z9481 Bone marrow transplant status: Secondary | ICD-10-CM | POA: Diagnosis not present

## 2023-02-22 DIAGNOSIS — Z7682 Awaiting organ transplant status: Secondary | ICD-10-CM | POA: Diagnosis not present

## 2023-02-22 DIAGNOSIS — N184 Chronic kidney disease, stage 4 (severe): Secondary | ICD-10-CM | POA: Diagnosis not present

## 2023-02-22 DIAGNOSIS — Z856 Personal history of leukemia: Secondary | ICD-10-CM | POA: Diagnosis not present

## 2023-02-22 DIAGNOSIS — D84821 Immunodeficiency due to drugs: Secondary | ICD-10-CM | POA: Diagnosis not present

## 2023-02-22 DIAGNOSIS — C9110 Chronic lymphocytic leukemia of B-cell type not having achieved remission: Secondary | ICD-10-CM | POA: Diagnosis not present

## 2023-02-22 DIAGNOSIS — Z01818 Encounter for other preprocedural examination: Secondary | ICD-10-CM | POA: Diagnosis not present

## 2023-02-22 DIAGNOSIS — D849 Immunodeficiency, unspecified: Secondary | ICD-10-CM | POA: Diagnosis not present

## 2023-02-22 DIAGNOSIS — T451X5A Adverse effect of antineoplastic and immunosuppressive drugs, initial encounter: Secondary | ICD-10-CM | POA: Diagnosis not present

## 2023-02-22 DIAGNOSIS — Z79621 Long term (current) use of calcineurin inhibitor: Secondary | ICD-10-CM | POA: Diagnosis not present

## 2023-02-24 DIAGNOSIS — Z9484 Stem cells transplant status: Principal | ICD-10-CM

## 2023-02-24 MED ORDER — AMLODIPINE 10 MG TABLET
ORAL_TABLET | Freq: Every day | ORAL | 0 refills | 0 days
Start: 2023-02-24 — End: ?

## 2023-02-24 MED ORDER — LOKELMA 10 GRAM ORAL POWDER PACKET
PACK | 1 refills | 0 days
Start: 2023-02-24 — End: ?

## 2023-02-26 MED ORDER — SODIUM ZIRCONIUM CYCLOSILICATE 10 GRAM ORAL POWDER PACKET
PACK | 1 refills | 0 days | Status: CP
Start: 2023-02-26 — End: ?

## 2023-02-26 MED ORDER — AMLODIPINE 10 MG TABLET
ORAL | 0 refills | 90 days | Status: CP
Start: 2023-02-26 — End: ?

## 2023-03-05 NOTE — Progress Notes (Signed)
Lithium 0.8 = 300 mg daily increase.  No change indicated.  This worked in the past.

## 2023-03-07 ENCOUNTER — Other Ambulatory Visit: Payer: Self-pay | Admitting: Psychiatry

## 2023-03-07 DIAGNOSIS — F331 Major depressive disorder, recurrent, moderate: Secondary | ICD-10-CM

## 2023-03-07 NOTE — Telephone Encounter (Signed)
Has appt 8/16 

## 2023-03-09 ENCOUNTER — Other Ambulatory Visit: Payer: Self-pay | Admitting: Psychiatry

## 2023-03-09 ENCOUNTER — Encounter: Payer: Self-pay | Admitting: Psychiatry

## 2023-03-09 ENCOUNTER — Ambulatory Visit: Payer: BC Managed Care – PPO | Admitting: Psychiatry

## 2023-03-09 DIAGNOSIS — F4001 Agoraphobia with panic disorder: Secondary | ICD-10-CM

## 2023-03-09 DIAGNOSIS — Z79899 Other long term (current) drug therapy: Secondary | ICD-10-CM

## 2023-03-09 DIAGNOSIS — F5105 Insomnia due to other mental disorder: Secondary | ICD-10-CM

## 2023-03-09 DIAGNOSIS — F331 Major depressive disorder, recurrent, moderate: Secondary | ICD-10-CM

## 2023-03-09 DIAGNOSIS — F4310 Post-traumatic stress disorder, unspecified: Secondary | ICD-10-CM

## 2023-03-09 MED ORDER — LITHIUM CARBONATE 150 MG PO CAPS
ORAL_CAPSULE | ORAL | 0 refills | Status: DC
Start: 2023-03-09 — End: 2023-06-14

## 2023-03-09 MED ORDER — AUVELITY 45-105 MG PO TBCR
EXTENDED_RELEASE_TABLET | ORAL | 1 refills | Status: DC
Start: 2023-03-09 — End: 2023-08-06

## 2023-03-09 NOTE — Progress Notes (Signed)
Meagan Walsh [id number] 1960/11/22 62 y.o.    Subjective:   Patient ID:  Meagan Walsh is a 62 y.o. (DOB 09-24-1960) female.  Chief Complaint:  Chief Complaint  Patient presents with   Follow-up   Depression   Anxiety    Meagan Walsh presents to the office today for follow-up of bipolar depression.  seen in January 2020.  Mood was stable and no meds were changed.   seen May 2020.  Lithium level was 0.68.  Mood was stable and no meds were changed.  Discussed her care with her oncologist Dr. [name] in June 2020.  She was dealing with pancytopenia and it was requested that she wean off lamotrigine to see if it was contributing.  That was done.  August 11, 2019 another conversation with Dr. [name].  She had elevated lithium of 2.1 on August 08, 2019.  She had lost some weight and was having some nausea and vomiting.  Lithium was held for a few days then restarted at 300 mg daily.  Lithium levels have been checked frequently since that time.  seen September 24, 2019.  She was not depressed but struggling with nausea and poor appetite and the following changes were made to see if it would help. In view of chronic nausea and weight loss despite med changes consider Trintellix.  Trintellix does cause nausea about 10% of people. It was tolerated in the past. Reduce Trintellix to 10 mg daily. Add mirtazapine 30 mg tablet at night which could help appetite and potentially replace Trintellix. If nausea is not resolved at follow up then stop Trintellix.  Call if depression worsens.  10/29/2019 appt noted: Did fine with med changes.  No problems with reduction in Trintellix.  Gained 11# since here.  At a good weight.  Wonders about stopping it for awhile. Good appetite.  No nausea now.  Still struggling with  CLL.  Needs bone marrow transplant but hasn't been able to occur yet and will be done in [place] with her living there for 3 mos.  Tired of being in limbo.  Rare Xanax used as  nausea med.  Using Ambien at HS.  Sleep alright.   In spite of stress has not been depressed.  Patient reports stable mood and denies depressed or irritable moods.  Patient denies any recent difficulty with anxiety.  Patient denies difficulty with sleep initiation or maintenance with Ambien. Denies appetite disturbance.  No weight loss.  Patient reports that energy and motivation have been good.  Patient denies any difficulty with concentration.  Patient denies any suicidal ideation. Plan: Continue Trintellix to 10 mg daily. Continue or stop mirtazapine 30 mg tablet at night per her request. Weight loss resolved.  10/22/2020 appt noted: 2nd bone marrow transplant in [place] in Jan. Will be there at least a couple of more months. Pending lithium level from today. Lost sig weight this year about 108#.  Feeding tube. Restarted mirtazapine 30 HS about a week ago. Has had a lot of nausea.   Takes alprazolam this week DT panicky feelings like walls caving in.  Worry over wasting away.  Seemed to help her.  Last day or 2 only needed one.  Don't think I've been depressed, certainly not like in the past.  More like anxiety feeling trapped.  In general feels ok. Still on trintellix 10 HS, lithium 300 mg HS, Ambien 5-10 mg HS. Sleep is pretty good.  Now labs 3 times per week and adjusting antirejection  med.   Tolerating psych meds as far as known bc no history of SE with psych meds.  03/25/2021 TC with oncology: Return telephone call to oncologist at [facility] Dr. Catha Nottingham. Reports that the patient is not eating well and has required a G-tube as well as a central line.  Dr. Catha Nottingham believes the patient has developed anorexia nervosa and is restricting not only calories but also fluids.  There is no other reasons for the gastric tube or further central line other than maintaining hydration and nutrition because she will not take in enough orally. The patient is no longer on mirtazapine but is still on  Trintellix.  Suggested the oncologist restart the mirtazapine 30 mg nightly because it helped the patient gain weight before.  We will get her in to the office as soon as possible.  If in fact she does have symptoms of any her anorexia nervosa then she will need more intensive therapy.  Dr. Catha Nottingham believes she may need an inpatient anorexia nervosa program.  04/01/2021 appt noted: Covid 03/04/21.   She's aware oncology is concerned she might have anorexia.  Hard time gaining weight.  Full so fast and when try to push it.  When in hospital went so long without eating.  Has feeding tube and high calorie food gives her diarrhea. Doesn't remember why she stopped mirtazapine. "I'm too thin" and hate the way I look.  Doesn't want to lose more bc of health danger.  No thirst and relying on Pick line for hydration getting IV fluids daily.   No history of EDO when younger. Not depressed.  Not anxious unusuallly.  It is better now.  Sleep is OK and about 9 hours nightly.  Still takes Ambien to go to sleep.  04/15/21 appt noted: Mirtazapine helped appetite and eaten more.  Diarrhea 2 days and have been off.  Might have gained a little.  A little better with fluids but not great.  Pick line removed.  Put fluids in GI tube.   No problems with mirtazapine. Sometimes has diarrhea but not usually. Weigh herself.   08/21/2022 urgent appointment noted: Has not been seen in some time and had weaned off all psych meds.  Has become more urgently depressed. No psych meds except trazodone for sleep.  No other psych meds for a year.. Regained wt lost in chemo.  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  Still low WBC. Called since early January more blue and not getting out of house.  Got Covid 2 weeks ago and recovered.  No post-Covid fatigue. Stress son in crisis in [place] and needs to fly out to help.  Fears going back into depression and got scared.  Calling a therpist soon.  Too much free time.  No SI.   Thinks it's Ok to resume lithium again as she stopped while in more intensive chemo.  Those medical concerns are no longer a problem.s   Sometimes THC gummy to help her feel calmer and sleep better prn.  2 times a month has taken alprazolam.   Appetite is oK Plan: Resume lithium 300 mg HS  10/11/22 appt noted: I feel really good.  Dr. Jorene Minors bone marrow doctor thinks benefit due to olanzapine 2.5 mg restarted early Feb after stopping it in Nov.  Was taking as an antinausea med. My lifestyle more conducive to not being depressed.   Now more active and got depressed after Covid. Meds: lithium 300 mg HS, olanzapine 2.5  mg HS, trazodone 100 mg HS Patient reports stable mood and denies depressed or irritable moods.  Patient denies any recent difficulty with anxiety.  Patient denies difficulty with sleep initiation or maintenance. Denies appetite disturbance.  Patient reports that energy and motivation have been good.  Patient denies any difficulty with concentration.  Patient denies any suicidal ideation. Plan: Hold lithium 300 mg HS DT kidney function, Cr 2.4 on 10/01/22.  The elevation is not new. Continue olanzapine 2.5 mg HS for mood Continue trazodone 100 HS Alprazolam prn   12/20/22 appt noted: Psych med: olanzapine 2.5 mg taking at 8 PM, trazodone 50-100 mg HS prn.  No other antidepressant. Delta 9 gummies. No lithium for a couple of mos. Still trouble falling asleep.  Goes to bed 1030.   Not dep.  But not enough going on right now.   Misses kids in [place].  Able to travel now.  Helps. Still on some anti-rejection meds.   Stage 4 kidney dz.  Will be on transplant list. Plan: no changes  02/14/23 TC resumed lithium 150 mg daily for 2 weeks without benefit.  Wonders about increasing the dose.   Urgent appt sched instead Feels scared of going into deep dep again.  Getting worse. Started after flu and had to stay in the house a couple of weeks and then the dep began coming back.  Started 19  June.   For a couple of mos isolated more and bored.  Not busy enough.  Somewhat down over kids in [place] and misses them.  02/16/23 urgent appt noted: Feels scared of going into deep dep again.  Getting worse. Started after flu and had to stay in the house a couple of weeks and then the dep began coming back.  Started 19 June.   For a couple of mos isolated more and bored.  Not busy enough.  Somewhat down over kids in [place] and misses them. Anxiety ridden terrified depression.   Not eating great since dep.  Not sleeping well either.  Lie there and ruminate.   Plan: Start Auvelity for depression, 1 in the AM for 1 week then 1 twice daily. Increase lithium to 300 mg or 2 of the 150 mg capsules. This worked before.  03/09/23  appt noted: Started feeling better after 3 days and gradually better thereafter.  90% better with depression .  Feels a little spacey and H notices. Some weakness.  Went so long without exercising. Not worse she thinks.   No dizziness.  No SE.  Minimal nausea except at night and history of this.  No other concerns.     2 severe episodes depression in life that last a full year.  Multiple failed prior medication trials include but are not limited to the following:   duloxetine, Viibryd,  Trintellix 20 mg failed until lithium was added Rexulti 0.5 mg caused restlessness, Abilify, Vraylar, Mirtazapine 30 helped appetite. Trazodone 100 mg HS Olanzapine 2.5 mg HS  Twin boys 28 , D 30  Review of Systems:  Review of Systems  Constitutional:  Negative for appetite change, fatigue and unexpected weight change.  Gastrointestinal:  Negative for abdominal pain, diarrhea, nausea and vomiting.  Neurological:  Negative for dizziness and tremors.  Psychiatric/Behavioral:  Negative for agitation, behavioral problems, confusion, decreased concentration, dysphoric mood, hallucinations, self-injury, sleep disturbance and suicidal ideas. The patient is not nervous/anxious and is not  hyperactive.     Medications: I have reviewed the patient's current medications.  Current Outpatient Medications  Medication Sig Dispense Refill   acetaminophen (TYLENOL) 325 MG tablet Take 650 mg by mouth every 6 (six) hours as needed.     ALPRAZolam (XANAX) 0.25 MG tablet TAKE 1 TO 2 TABLETS BY MOUTH EVERY DAY AS NEEDED FOR ANXIETY 60 tablet 2   Dextromethorphan-buPROPion ER (AUVELITY) 45-105 MG TBCR 1 tablet in the AM for 1 week then 1 twice daily 60 tablet 0   famotidine (PEPCID) 20 MG tablet Take 1 tablet (20 mg total) by mouth 2 (two) times daily. 60 tablet 2   folic acid (FOLVITE) 1 MG tablet TAKE 1 TABLET BY MOUTH EVERY DAY 90 tablet 0   levothyroxine (SYNTHROID) 100 MCG tablet TAKE 1 TABLET BY MOUTH EVERY DAY 30 tablet 2   lithium carbonate 150 MG capsule Take 1 capsule (150 mg total) by mouth at bedtime. 90 capsule 0   Magnesium 200 MG TABS Take 200 mg by mouth.     Maribavir 200 MG TABS Take by mouth in the morning and at bedtime.     OLANZapine (ZYPREXA) 2.5 MG tablet Take 2.5 mg by mouth at bedtime.     Polyethylene Glycol 3350 (MIRALAX PO) Take 17 g by mouth daily as needed.     prochlorperazine (COMPAZINE) 5 MG tablet TAKE 1 TO 2 TABLETS EVERY 6 HOURS AS NEEDED FOR NAUSEA OR VOMITING FOR UP TO 3 DAYS 60 tablet 1   tacrolimus (PROGRAF) 0.5 MG capsule Take 1.5 mg by mouth in the morning and at bedtime.     traZODone (DESYREL) 50 MG tablet Take 50 mg by mouth at bedtime. 2 TABS hs     No current facility-administered medications for this visit.   Facility-Administered Medications Ordered in Other Visits  Medication Dose Route Frequency Provider Last Rate Last Admin   0.9 %  sodium chloride infusion (Manually program via Guardrails IV Fluids)  250 mL Intravenous Once Ladene Artist, MD       0.9 %  sodium chloride infusion (Manually program via Guardrails IV Fluids)  250 mL Intravenous Once Ladene Artist, MD       heparin lock flush 100 unit/mL  250 Units Intracatheter PRN  Ladene Artist, MD       sodium chloride flush (NS) 0.9 % injection 10 mL  10 mL Intracatheter PRN Ladene Artist, MD   10 mL at 02/21/19 0945   sodium chloride flush (NS) 0.9 % injection 10 mL  10 mL Intracatheter PRN Ladene Artist, MD       SUMAtriptan (IMITREX) nasal spray 20 mg  20 mg Nasal Once Ladene Artist, MD        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   Hypothyroidism    Lymphocytosis    Mitral valve prolapse    Mitral valve prolapse    Thyroid disease    Hypothyroid    Family History  Problem Relation Age of Onset   Breast cancer Mother    Prostate cancer Father    Colon cancer Neg Hx    Esophageal cancer Neg Hx    Pancreatic cancer Neg Hx    Stomach cancer Neg Hx  Social History   Socioeconomic History   Marital status: Married    Spouse name: Not on file   Number of children: Not on file   Years of education: Not on file   Highest education level: Not on file  Occupational History   Not on file  Tobacco Use   Smoking status: Never   Smokeless tobacco: Never  Vaping Use   Vaping status: Not on file  Substance and Sexual Activity   Alcohol use: Not Currently    Comment: 2 glasses of wine 2 times per week    Drug use: No   Sexual activity: Not on file  Other Topics Concern   Not on file  Social History Narrative   Pt lives in 2 story home with her husband   Has 3 adult children   Masters degree   Works at [employer]   Social Determinants of Corporate investment banker Strain: Low Risk  (09/25/2020)   Received from [facility], [facility]   Overall Financial Resource Strain (CARDIA)    Difficulty of Paying Living Expenses: Not very hard  Food Insecurity: No Food  Insecurity (09/25/2020)   Received from [facility], [facility]   Hunger Vital Sign    Worried About Running Out of Food in the Last Year: Never true    Ran Out of Food in the Last Year: Never true  Transportation Needs: No Transportation Needs (09/25/2020)   Received from [facility], [facility]   PRAPARE - Transportation    Lack of Transportation (Medical): No    Lack of Transportation (Non-Medical): No  Physical Activity: Not on file  Stress: Not on file  Social Connections: Not on file  Intimate Partner Violence: Not on file    Past Medical History, Surgical history, Social history, and Family history were reviewed and updated as appropriate.   Please see review of systems for further details on the patient's review from today.   Objective:   Physical Exam:  There were no vitals taken for this visit.  Physical Exam Constitutional:      General: She is not in acute distress.    Appearance: She is well-developed.  Musculoskeletal:        General: No deformity.  Neurological:     Mental Status: She is alert and oriented to person, place, and time.     Motor: No tremor.     Coordination: Coordination normal.     Gait: Gait normal.  Psychiatric:        Attention and Perception: Attention and perception normal.        Mood and Affect: Mood is depressed. Mood is not anxious. Affect is not labile, tearful or inappropriate.        Speech: Speech normal. Speech is not slurred.        Behavior: Behavior normal.        Thought Content: Thought content normal. Thought content is not delusional. Thought content does not include homicidal or suicidal ideation. Thought content does not include suicidal plan.        Cognition and Memory: Cognition normal.        Judgment: Judgment normal.     Comments: Insight intact. No auditory or visual hallucinations. No delusions.  No anxiety and dep 90% better     Lab Review:     Component Value Date/Time   NA 137  02/21/2023 1436   K 3.7 02/21/2023 1436   CL  106 02/21/2023 1436   CO2 25 02/21/2023 1436   GLUCOSE 104 02/21/2023 1436   BUN 49 (H) 02/21/2023 1436   CREATININE 2.67 (H) 02/21/2023 1436   CALCIUM 8.9 02/21/2023 1436   PROT 7.7 03/15/2022 1301   ALBUMIN 4.2 03/15/2022 1301   AST 25 03/15/2022 1301   ALT 30 03/15/2022 1301   ALKPHOS 68 03/15/2022 1301   BILITOT 0.3 03/15/2022 1301   GFRNONAA 19 (L) 03/15/2022 1301   GFRAA >60 12/17/2019 0830       Component Value Date/Time   WBC 3.9 (L) 03/15/2022 1301   WBC 9.9 03/31/2019 0845   RBC 3.03 (L) 03/15/2022 1301   HGB 9.6 (L) 03/15/2022 1301   HGB 14.0 06/27/2016 1038   HCT 29.4 (L) 03/15/2022 1301   HCT 42.0 06/27/2016 1038   PLT 114 (L) 03/15/2022 1301   PLT 205 06/27/2016 1038   MCV 97.0 03/15/2022 1301   MCV 92.9 06/27/2016 1038   MCH 31.7 03/15/2022 1301   MCHC 32.7 03/15/2022 1301   RDW 12.3 03/15/2022 1301   RDW 12.2 06/27/2016 1038   LYMPHSABS 2.1 03/15/2022 1301   LYMPHSABS 21.5 (H) 06/27/2016 1038   MONOABS 0.3 03/15/2022 1301   MONOABS 0.5 06/27/2016 1038   EOSABS 0.3 03/15/2022 1301   EOSABS 0.2 06/27/2016 1038   BASOSABS 0.0 03/15/2022 1301   BASOSABS 0.1 06/27/2016 1038    Lithium Lvl  Date Value Ref Range Status  02/21/2023 0.8 0.6 - 1.2 mmol/L Final    Component 08/24/20 08/14/20 04/02/20 01/07/20  Lithium Lvl 0.4 0.6 0.8 0.6   on 300 mg daily  Component 12/19/22 11/14/22 10/10/22 09/06/22 08/08/22 07/14/22  Sodium 143 142 141 141 141 145  Potassium 4.2 4.3 4.0 4.2 5.2 High  4.8  Chloride 113 High  114 High  113 High  113 High  113 High  117 High   CO2 25.0 20.0 24.0 25.0 24.0 22.0  Anion Gap 5 8 4  Low  3 Low  4 Low  6  BUN 46 High  58 High  46 High  50 High  51 High  48 High   Creatinine 2.09 High  2.54 High  2.43 High  2.65 High  2.90 High  2.73 High   BUN/Creatinine Ratio 22 23 19 19 18 18   eGFR CKD-EPI (2021) Female 26 Low   21 Low   22 Low   20 Low   18 Low   19 Low      No results  found for: "PHENYTOIN", "PHENOBARB", "VALPROATE", "CBMZ"   .res Assessment: Plan:    Major depressive disorder, recurrent episode, moderate (HCC)  Panic disorder with agoraphobia  PTSD (post-traumatic stress disorder)  Insomnia due to mental condition  Lithium use   30 min face to face time with patient was spent on counseling and oordination of care. We discussed Benny has had history of 2 very severe prolonged depressions that failed to respond to multiple psychiatric medications but then responded to lithium potentiation.  She has been through recent winters with no difficulty.  She has a very strong seasonal pattern.  She previously failed to respond to several antidepressants including her last antidepressant which was Trintellix 20 mg until lithium 300 mg daily was added and she went into remission. She remained in remission through treatment for cancer during which time she had to stop all her psychiatric medications.  She has remained in remission until this January 2024 .[name]  Her cancer has remained in remission.  Her depression resolved again when here in January 2024 since resuming lithium 300 mg nightly.  However her cancer doctor also resumed olanzapine 2.5 mg nightly at about the same time and there is a question as to which med change helped her.  Because of her high creatinine level we stopped the lithium in the hopes that the olanzapine was what actually improved her mood.    However dep relapse after flu June 2024.  Has resolved again with Auvelity and increase in lithium to 300 mg daily with level 0.8 02/21/23.  The use of lithium is more risky given the elevated creatinine.  Disc again SS lithium toxicity and this risk greater given kidney status.    Best options given his TRD novel AD Auvelity or resuming lithium.  Given the severity of her sx and how desperate she felt to get better soon, will did both.  Lithium has helped in the past.  Now need to discover which med  helped.   Will try to reduce lithium.  If relapse increase it back up  Component 08/24/20 08/14/20 04/02/20 01/07/20  Lithium Lvl 0.4 0.6 0.8 0.6   Component 12/19/22 11/14/22 10/10/22 09/06/22 08/08/22 07/14/22  Sodium 143 142 141 141 141 145  Potassium 4.2 4.3 4.0 4.2 5.2 High  4.8  Chloride 113 High  114 High  113 High  113 High  113 High  117 High   CO2 25.0 20.0 24.0 25.0 24.0 22.0  Anion Gap 5 8 4  Low  3 Low  4 Low  6  BUN 46 High  58 High  46 High  50 High  51 High  48 High   Creatinine 2.09 High  2.54 High  2.43 High  2.65 High  2.90 High  2.73 High   BUN/Creatinine Ratio 22 23 19 19 18 18   eGFR CKD-EPI (2021) Female 26 Low   21 Low   22 Low   20 Low   18 Low   19 Low     Lithium 0.8 on 02/21/23  on 300 mg HS.  Cr 2.4 on 10/01/22.  The elevation is not new.  Continue olanzapine 2.5 mg HS for mood Continue trazodone 100 HS Alprazolam prn HS.   continue Auvelity for depression 1 twice daily.  Reduce lithium to 2 capsules on even days of the month and 1 capsule on odd days of the month.  Call if recurrence of depression  FU 1 mos  Meredith Staggers, MD, DFAPA   Please see After Visit Summary for patient specific instructions.  Future Appointments  Date Time Provider Department Center  06/25/2023  2:00 PM Cottle, Steva Ready., MD [facility] None      No orders of the defined types were placed in this encounter.     -------------------------------

## 2023-03-09 NOTE — Patient Instructions (Signed)
Reduce lithium to 2 capsules on even days of the month and 1 capsule on odd days of the month.

## 2023-03-15 ENCOUNTER — Telehealth: Payer: Self-pay | Admitting: Psychiatry

## 2023-03-15 NOTE — Telephone Encounter (Signed)
Pt's husband LVM@ 8/21 @ 5:27p.  He is on DPR.  He said pt has terrible constipation.  Her gastroenterologist thinks it may be Auvelity and want her to stop taking it.  They have concerns about doing that.   Next appt 10/2

## 2023-03-15 NOTE — Telephone Encounter (Signed)
Patient reports severe constipation. She was advised by her GI provider to take Miralax. Said she took 2 doses for 2 days and then was told to take every hour yesterday. She has had no results thus far. She called GI again and she reports that Auvelity could be the cause. Asking for guidance.

## 2023-03-15 NOTE — Telephone Encounter (Signed)
Auvelity has a 4% risk constipation and 6% risk diarrhea. Stop Auvelity until the constipation resolves.  Take Miralax daily.  Then resume 1 Auvelity in the AM

## 2023-03-16 NOTE — Telephone Encounter (Signed)
Patient notified. She did not take Auvelity last night and reported some relief.

## 2023-03-19 ENCOUNTER — Other Ambulatory Visit: Payer: Self-pay | Admitting: Gastroenterology

## 2023-03-19 ENCOUNTER — Ambulatory Visit
Admission: RE | Admit: 2023-03-19 | Discharge: 2023-03-19 | Disposition: A | Discharge status: Home / Self Care | Payer: BC Managed Care – PPO | Source: Ambulatory Visit | Attending: Gastroenterology | Admitting: Gastroenterology

## 2023-03-19 DIAGNOSIS — K5900 Constipation, unspecified: Secondary | ICD-10-CM | POA: Diagnosis not present

## 2023-03-20 DIAGNOSIS — N186 End stage renal disease: Secondary | ICD-10-CM | POA: Diagnosis not present

## 2023-03-20 DIAGNOSIS — N2581 Secondary hyperparathyroidism of renal origin: Secondary | ICD-10-CM | POA: Diagnosis not present

## 2023-03-20 DIAGNOSIS — C9210 Chronic myeloid leukemia, BCR/ABL-positive, not having achieved remission: Secondary | ICD-10-CM | POA: Diagnosis not present

## 2023-03-20 DIAGNOSIS — D619 Aplastic anemia, unspecified: Secondary | ICD-10-CM | POA: Diagnosis not present

## 2023-03-20 DIAGNOSIS — I10 Essential (primary) hypertension: Secondary | ICD-10-CM | POA: Diagnosis not present

## 2023-03-21 ENCOUNTER — Other Ambulatory Visit: Admit: 2023-03-21 | Discharge: 2023-03-22 | Payer: PRIVATE HEALTH INSURANCE

## 2023-03-21 ENCOUNTER — Ambulatory Visit: Admit: 2023-03-21 | Discharge: 2023-03-22 | Payer: PRIVATE HEALTH INSURANCE

## 2023-03-21 DIAGNOSIS — Q899 Congenital malformation, unspecified: Secondary | ICD-10-CM | POA: Diagnosis not present

## 2023-03-21 DIAGNOSIS — E039 Hypothyroidism, unspecified: Secondary | ICD-10-CM | POA: Diagnosis not present

## 2023-03-21 DIAGNOSIS — Z9481 Bone marrow transplant status: Secondary | ICD-10-CM | POA: Diagnosis not present

## 2023-03-21 DIAGNOSIS — D61818 Other pancytopenia: Secondary | ICD-10-CM | POA: Diagnosis not present

## 2023-03-21 DIAGNOSIS — N184 Chronic kidney disease, stage 4 (severe): Secondary | ICD-10-CM | POA: Diagnosis not present

## 2023-03-21 DIAGNOSIS — F319 Bipolar disorder, unspecified: Secondary | ICD-10-CM | POA: Diagnosis not present

## 2023-03-21 DIAGNOSIS — I129 Hypertensive chronic kidney disease with stage 1 through stage 4 chronic kidney disease, or unspecified chronic kidney disease: Secondary | ICD-10-CM | POA: Diagnosis not present

## 2023-03-21 DIAGNOSIS — Z9484 Stem cells transplant status: Secondary | ICD-10-CM | POA: Diagnosis not present

## 2023-03-21 DIAGNOSIS — D84822 Immunodeficiency due to external causes: Secondary | ICD-10-CM | POA: Diagnosis not present

## 2023-03-21 DIAGNOSIS — Z5181 Encounter for therapeutic drug level monitoring: Secondary | ICD-10-CM | POA: Diagnosis not present

## 2023-03-21 DIAGNOSIS — G4700 Insomnia, unspecified: Secondary | ICD-10-CM | POA: Diagnosis not present

## 2023-03-21 DIAGNOSIS — Z79890 Hormone replacement therapy (postmenopausal): Secondary | ICD-10-CM | POA: Diagnosis not present

## 2023-03-21 DIAGNOSIS — Z79899 Other long term (current) drug therapy: Secondary | ICD-10-CM | POA: Diagnosis not present

## 2023-03-21 DIAGNOSIS — C9110 Chronic lymphocytic leukemia of B-cell type not having achieved remission: Secondary | ICD-10-CM | POA: Diagnosis not present

## 2023-03-21 DIAGNOSIS — K5900 Constipation, unspecified: Secondary | ICD-10-CM | POA: Diagnosis not present

## 2023-03-21 DIAGNOSIS — G40409 Other generalized epilepsy and epileptic syndromes, not intractable, without status epilepticus: Secondary | ICD-10-CM | POA: Diagnosis not present

## 2023-03-21 MED ORDER — AMLODIPINE 5 MG TABLET
ORAL | 3 refills | 30 days | Status: CP
Start: 2023-03-21 — End: ?

## 2023-03-22 DIAGNOSIS — K640 First degree hemorrhoids: Secondary | ICD-10-CM | POA: Diagnosis not present

## 2023-03-22 DIAGNOSIS — Z1211 Encounter for screening for malignant neoplasm of colon: Secondary | ICD-10-CM | POA: Diagnosis not present

## 2023-03-22 MED ORDER — FLUDROCORTISONE 0.1 MG TABLET
ORAL_TABLET | ORAL | 3 refills | 0 days | Status: CP
Start: 2023-03-22 — End: ?

## 2023-03-27 ENCOUNTER — Telehealth: Payer: Self-pay | Admitting: *Deleted

## 2023-03-27 DIAGNOSIS — C9110 Chronic lymphocytic leukemia of B-cell type not having achieved remission: Secondary | ICD-10-CM

## 2023-03-27 NOTE — Telephone Encounter (Signed)
Patient called and left VM requesting lab tomorrow. Per Dr. [name], needs weekly BMET x 3 for [facility] provider. Scheduling message sent.

## 2023-03-28 ENCOUNTER — Inpatient Hospital Stay: Payer: BC Managed Care – PPO | Attending: Oncology

## 2023-03-28 DIAGNOSIS — Z9484 Stem cells transplant status: Secondary | ICD-10-CM | POA: Diagnosis not present

## 2023-03-28 DIAGNOSIS — C9100 Acute lymphoblastic leukemia not having achieved remission: Secondary | ICD-10-CM | POA: Insufficient documentation

## 2023-03-28 DIAGNOSIS — C9110 Chronic lymphocytic leukemia of B-cell type not having achieved remission: Secondary | ICD-10-CM

## 2023-03-28 LAB — BASIC METABOLIC PANEL - CANCER CENTER ONLY
Anion gap: 6 (ref 5–15)
BUN: 29 mg/dL — ABNORMAL HIGH (ref 8–23)
CO2: 31 mmol/L (ref 22–32)
Calcium: 9.3 mg/dL (ref 8.9–10.3)
Chloride: 105 mmol/L (ref 98–111)
Creatinine: 2.3 mg/dL — ABNORMAL HIGH (ref 0.44–1.00)
GFR, Estimated: 23 mL/min — ABNORMAL LOW (ref >60)
Glucose, Bld: 107 mg/dL — ABNORMAL HIGH (ref 70–99)
Potassium: 3.1 mmol/L — ABNORMAL LOW (ref 3.5–5.1)
Sodium: 142 mmol/L (ref 135–145)

## 2023-04-04 ENCOUNTER — Other Ambulatory Visit: Payer: BC Managed Care – PPO

## 2023-04-10 ENCOUNTER — Inpatient Hospital Stay: Payer: BC Managed Care – PPO

## 2023-04-10 DIAGNOSIS — C9100 Acute lymphoblastic leukemia not having achieved remission: Secondary | ICD-10-CM | POA: Diagnosis not present

## 2023-04-10 DIAGNOSIS — Z9484 Stem cells transplant status: Secondary | ICD-10-CM | POA: Diagnosis not present

## 2023-04-10 DIAGNOSIS — C9110 Chronic lymphocytic leukemia of B-cell type not having achieved remission: Secondary | ICD-10-CM

## 2023-04-10 LAB — BASIC METABOLIC PANEL - CANCER CENTER ONLY
Anion gap: 5 (ref 5–15)
BUN: 56 mg/dL — ABNORMAL HIGH (ref 8–23)
CO2: 24 mmol/L (ref 22–32)
Calcium: 8.8 mg/dL — ABNORMAL LOW (ref 8.9–10.3)
Chloride: 108 mmol/L (ref 98–111)
Creatinine: 2.88 mg/dL — ABNORMAL HIGH (ref 0.44–1.00)
GFR, Estimated: 18 mL/min — ABNORMAL LOW (ref >60)
Glucose, Bld: 101 mg/dL — ABNORMAL HIGH (ref 70–99)
Potassium: 5.2 mmol/L — ABNORMAL HIGH (ref 3.5–5.1)
Sodium: 137 mmol/L (ref 135–145)

## 2023-04-11 ENCOUNTER — Other Ambulatory Visit: Payer: BC Managed Care – PPO

## 2023-04-11 DIAGNOSIS — Z008 Encounter for other general examination: Secondary | ICD-10-CM | POA: Diagnosis not present

## 2023-04-11 DIAGNOSIS — Z7682 Awaiting organ transplant status: Secondary | ICD-10-CM | POA: Diagnosis not present

## 2023-04-11 DIAGNOSIS — F54 Psychological and behavioral factors associated with disorders or diseases classified elsewhere: Secondary | ICD-10-CM | POA: Diagnosis not present

## 2023-04-17 ENCOUNTER — Inpatient Hospital Stay: Payer: BC Managed Care – PPO

## 2023-04-17 DIAGNOSIS — Z9484 Stem cells transplant status: Secondary | ICD-10-CM | POA: Diagnosis not present

## 2023-04-17 DIAGNOSIS — C9110 Chronic lymphocytic leukemia of B-cell type not having achieved remission: Secondary | ICD-10-CM

## 2023-04-17 DIAGNOSIS — C9100 Acute lymphoblastic leukemia not having achieved remission: Secondary | ICD-10-CM | POA: Diagnosis not present

## 2023-04-17 LAB — BASIC METABOLIC PANEL - CANCER CENTER ONLY
Anion gap: 6 (ref 5–15)
BUN: 49 mg/dL — ABNORMAL HIGH (ref 8–23)
CO2: 24 mmol/L (ref 22–32)
Calcium: 9.7 mg/dL (ref 8.9–10.3)
Chloride: 111 mmol/L (ref 98–111)
Creatinine: 2.58 mg/dL — ABNORMAL HIGH (ref 0.44–1.00)
GFR, Estimated: 20 mL/min — ABNORMAL LOW (ref >60)
Glucose, Bld: 103 mg/dL — ABNORMAL HIGH (ref 70–99)
Potassium: 4.3 mmol/L (ref 3.5–5.1)
Sodium: 141 mmol/L (ref 135–145)

## 2023-04-20 DIAGNOSIS — Z01818 Encounter for other preprocedural examination: Secondary | ICD-10-CM | POA: Diagnosis not present

## 2023-04-20 DIAGNOSIS — Z7682 Awaiting organ transplant status: Secondary | ICD-10-CM | POA: Diagnosis not present

## 2023-04-20 DIAGNOSIS — I7090 Unspecified atherosclerosis: Secondary | ICD-10-CM | POA: Diagnosis not present

## 2023-04-20 DIAGNOSIS — R918 Other nonspecific abnormal finding of lung field: Secondary | ICD-10-CM | POA: Diagnosis not present

## 2023-04-20 DIAGNOSIS — Z01810 Encounter for preprocedural cardiovascular examination: Secondary | ICD-10-CM | POA: Diagnosis not present

## 2023-04-20 DIAGNOSIS — R935 Abnormal findings on diagnostic imaging of other abdominal regions, including retroperitoneum: Secondary | ICD-10-CM | POA: Diagnosis not present

## 2023-04-20 DIAGNOSIS — I358 Other nonrheumatic aortic valve disorders: Secondary | ICD-10-CM | POA: Diagnosis not present

## 2023-04-20 DIAGNOSIS — I708 Atherosclerosis of other arteries: Secondary | ICD-10-CM | POA: Diagnosis not present

## 2023-04-25 ENCOUNTER — Encounter: Payer: Self-pay | Admitting: Psychiatry

## 2023-04-25 ENCOUNTER — Ambulatory Visit (INDEPENDENT_AMBULATORY_CARE_PROVIDER_SITE_OTHER): Payer: BC Managed Care – PPO | Admitting: Psychiatry

## 2023-04-25 DIAGNOSIS — F5105 Insomnia due to other mental disorder: Secondary | ICD-10-CM | POA: Diagnosis not present

## 2023-04-25 DIAGNOSIS — F4001 Agoraphobia with panic disorder: Secondary | ICD-10-CM | POA: Diagnosis not present

## 2023-04-25 DIAGNOSIS — F4310 Post-traumatic stress disorder, unspecified: Secondary | ICD-10-CM

## 2023-04-25 DIAGNOSIS — Z79899 Other long term (current) drug therapy: Secondary | ICD-10-CM

## 2023-04-25 DIAGNOSIS — F331 Major depressive disorder, recurrent, moderate: Secondary | ICD-10-CM | POA: Diagnosis not present

## 2023-04-25 NOTE — Progress Notes (Signed)
Meagan Walsh [id number] March 05, 1961 62 y.o.    Subjective:   Patient ID:  Meagan Walsh is a 62 y.o. (DOB 11-28-1960) female.  Chief Complaint:  Chief Complaint  Patient presents with   Follow-up   Depression   Medication Problem    [name] presents to the office today for follow-up of bipolar depression.  seen in January 2020.  Mood was stable and no meds were changed.   seen May 2020.  Lithium level was 0.68.  Mood was stable and no meds were changed.  Discussed her care with her oncologist Dr. [name] in June 2020.  She was dealing with pancytopenia and it was requested that she wean off lamotrigine to see if it was contributing.  That was done.  August 11, 2019 another conversation with Dr. [name].  She had elevated lithium of 2.1 on August 08, 2019.  She had lost some weight and was having some nausea and vomiting.  Lithium was held for a few days then restarted at 300 mg daily.  Lithium levels have been checked frequently since that time.  seen September 24, 2019.  She was not depressed but struggling with nausea and poor appetite and the following changes were made to see if it would help. In view of chronic nausea and weight loss despite med changes consider Trintellix.  Trintellix does cause nausea about 10% of people. It was tolerated in the past. Reduce Trintellix to 10 mg daily. Add mirtazapine 30 mg tablet at night which could help appetite and potentially replace Trintellix. If nausea is not resolved at follow up then stop Trintellix.  Call if depression worsens.  10/29/2019 appt noted: Did fine with med changes.  No problems with reduction in Trintellix.  Gained 11# since here.  At a good weight.  Wonders about stopping it for awhile. Good appetite.  No nausea now.  Still struggling with  CLL.  Needs bone marrow transplant but hasn't been able to occur yet and will be done in [place] with her living there for 3 mos.  Tired of being in limbo.  Rare  Xanax used as nausea med.  Using Ambien at HS.  Sleep alright.   In spite of stress has not been depressed.  Patient reports stable mood and denies depressed or irritable moods.  Patient denies any recent difficulty with anxiety.  Patient denies difficulty with sleep initiation or maintenance with Ambien. Denies appetite disturbance.  No weight loss.  Patient reports that energy and motivation have been good.  Patient denies any difficulty with concentration.  Patient denies any suicidal ideation. Plan: Continue Trintellix to 10 mg daily. Continue or stop mirtazapine 30 mg tablet at night per her request. Weight loss resolved.  10/22/2020 appt noted: 2nd bone marrow transplant in [place] in Jan. Will be there at least a couple of more months. Pending lithium level from today. Lost sig weight this year about 108#.  Feeding tube. Restarted mirtazapine 30 HS about a week ago. Has had a lot of nausea.   Takes alprazolam this week DT panicky feelings like walls caving in.  Worry over wasting away.  Seemed to help her.  Last day or 2 only needed one.  Don't think I've been depressed, certainly not like in the past.  More like anxiety feeling trapped.  In general feels ok. Still on trintellix 10 HS, lithium 300 mg HS, Ambien 5-10 mg HS. Sleep is pretty good.  Now labs 3 times per week and adjusting  antirejection med.   Tolerating psych meds as far as known bc no history of SE with psych meds.  03/25/2021 TC with oncology: Return telephone call to oncologist at [facility] Dr. Catha Nottingham. Reports that the patient is not eating well and has required a G-tube as well as a central line.  Dr. Catha Nottingham believes the patient has developed anorexia nervosa and is restricting not only calories but also fluids.  There is no other reasons for the gastric tube or further central line other than maintaining hydration and nutrition because she will not take in enough orally. The patient is no longer on mirtazapine  but is still on Trintellix.  Suggested the oncologist restart the mirtazapine 30 mg nightly because it helped the patient gain weight before.  We will get her in to the office as soon as possible.  If in fact she does have symptoms of any her anorexia nervosa then she will need more intensive therapy.  Dr. Catha Nottingham believes she may need an inpatient anorexia nervosa program.  04/01/2021 appt noted: Covid 03/04/21.   She's aware oncology is concerned she might have anorexia.  Hard time gaining weight.  Full so fast and when try to push it.  When in hospital went so long without eating.  Has feeding tube and high calorie food gives her diarrhea. Doesn't remember why she stopped mirtazapine. "I'm too thin" and hate the way I look.  Doesn't want to lose more bc of health danger.  No thirst and relying on Pick line for hydration getting IV fluids daily.   No history of EDO when younger. Not depressed.  Not anxious unusuallly.  It is better now.  Sleep is OK and about 9 hours nightly.  Still takes Ambien to go to sleep.  04/15/21 appt noted: Mirtazapine helped appetite and eaten more.  Diarrhea 2 days and have been off.  Might have gained a little.  A little better with fluids but not great.  Pick line removed.  Put fluids in GI tube.   No problems with mirtazapine. Sometimes has diarrhea but not usually. Weigh herself.   08/21/2022 urgent appointment noted: Has not been seen in some time and had weaned off all psych meds.  Has become more urgently depressed. No psych meds except trazodone for sleep.  No other psych meds for a year.. Regained wt lost in chemo.  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  Still low WBC. Called since early January more blue and not getting out of house.  Got Covid 2 weeks ago and recovered.  No post-Covid fatigue. Stress son in crisis in [place] and needs to fly out to help.  Fears going back into depression and got scared.  Calling a therpist soon.  Too much free  time.  No SI.  Thinks it's Ok to resume lithium again as she stopped while in more intensive chemo.  Those medical concerns are no longer a problem.s   Sometimes THC gummy to help her feel calmer and sleep better prn.  2 times a month has taken alprazolam.   Appetite is oK Plan: Resume lithium 300 mg HS  10/11/22 appt noted: I feel really good.  Dr. Jorene Minors bone marrow doctor thinks benefit due to olanzapine 2.5 mg restarted early Feb after stopping it in Nov.  Was taking as an antinausea med. My lifestyle more conducive to not being depressed.   Now more active and got depressed after Covid. Meds: lithium 300 mg HS, olanzapine  2.5 mg HS, trazodone 100 mg HS Patient reports stable mood and denies depressed or irritable moods.  Patient denies any recent difficulty with anxiety.  Patient denies difficulty with sleep initiation or maintenance. Denies appetite disturbance.  Patient reports that energy and motivation have been good.  Patient denies any difficulty with concentration.  Patient denies any suicidal ideation. Plan: Hold lithium 300 mg HS DT kidney function, Cr 2.4 on 10/01/22.  The elevation is not new. Continue olanzapine 2.5 mg HS for mood Continue trazodone 100 HS Alprazolam prn   12/20/22 appt noted: Psych med: olanzapine 2.5 mg taking at 8 PM, trazodone 50-100 mg HS prn.  No other antidepressant. Delta 9 gummies. No lithium for a couple of mos. Still trouble falling asleep.  Goes to bed 1030.   Not dep.  But not enough going on right now.   Misses kids in [place].  Able to travel now.  Helps. Still on some anti-rejection meds.   Stage 4 kidney dz.  Will be on transplant list. Plan: no changes  02/14/23 TC resumed lithium 150 mg daily for 2 weeks without benefit.  Wonders about increasing the dose.   Urgent appt sched instead Feels scared of going into deep dep again.  Getting worse. Started after flu and had to stay in the house a couple of weeks and then the dep began coming back.   Started 19 June.   For a couple of mos isolated more and bored.  Not busy enough.  Somewhat down over kids in [place] and misses them.  02/16/23 urgent appt noted: Feels scared of going into deep dep again.  Getting worse. Started after flu and had to stay in the house a couple of weeks and then the dep began coming back.  Started 19 June.   For a couple of mos isolated more and bored.  Not busy enough.  Somewhat down over kids in [place] and misses them. Anxiety ridden terrified depression.   Not eating great since dep.  Not sleeping well either.  Lie there and ruminate.   Plan: Start Auvelity for depression, 1 in the AM for 1 week then 1 twice daily. Increase lithium to 300 mg or 2 of the 150 mg capsules. This worked before.  03/09/23  appt noted: Started feeling better after 3 days and gradually better thereafter.  90% better with depression .  Feels a little spacey and H notices. Some weakness.  Went so long without exercising. Not worse she thinks.   No dizziness.  No SE.  Minimal nausea except at night and history of this.  No other concerns.   Plan: Continue olanzapine 2.5 mg HS for mood Continue trazodone 100 HS Alprazolam prn HS.   continue Auvelity for depression 1 twice daily. Reduce lithium to 2 capsules on even days of the month and 1 capsule on odd days of the month.  Franchot Erichsen, CMA  to Me    03/15/23  9:47 AM Note Patient reports severe constipation. She was advised by her GI provider to take Miralax. Said she took 2 doses for 2 days and then was told to take every hour yesterday. She has had no results thus far. She called GI again and she reports that Auvelity could be the cause. Asking for guidance.     Me  to Franchot Erichsen, CMA  CC   03/15/23  5:53 PM Note Auvelity has a 4% risk constipation and 6% risk diarrhea. Stop Auvelity until the constipation resolves.  Take Miralax daily.  Then resume 1 Auvelity in the AM     04/25/23 appt noted: Psych meds: Auvelity 1  every morning, lithium 150 mg capsule 2 alternating with 1 every other day, olanzapine 2.5 nightly. Doing well .  Not dep.  Tolerating meds. Constipation resolved so far. Went to [place] and [place] visiting son. H doing well.  Going to wedding in [place].  He's still working.   Sleep and appetite are good.  2 severe episodes depression in life that last a full year.  Multiple failed prior medication trials include but are not limited to the following:   duloxetine, Viibryd,  Trintellix 20 mg failed until lithium was added Rexulti 0.5 mg caused restlessness, Abilify, Vraylar, Mirtazapine 30 helped appetite. Trazodone 100 mg HS Olanzapine 2.5 mg HS  Twin boys 28 , D 30  Review of Systems:  Review of Systems  Constitutional:  Negative for appetite change, fatigue and unexpected weight change.  Gastrointestinal:  Negative for abdominal pain, diarrhea, nausea and vomiting.  Neurological:  Negative for dizziness and tremors.  Psychiatric/Behavioral:  Negative for agitation, behavioral problems, confusion, decreased concentration, dysphoric mood, hallucinations, self-injury, sleep disturbance and suicidal ideas. The patient is not nervous/anxious and is not hyperactive.     Medications: I have reviewed the patient's current medications.  Current Outpatient Medications  Medication Sig Dispense Refill   acetaminophen (TYLENOL) 325 MG tablet Take 650 mg by mouth every 6 (six) hours as needed.     ALPRAZolam (XANAX) 0.25 MG tablet TAKE 1 TO 2 TABLETS BY MOUTH EVERY DAY AS NEEDED FOR ANXIETY 60 tablet 2   Dextromethorphan-buPROPion ER (AUVELITY) 45-105 MG TBCR 1 tablet in the AM for 1 week then 1 twice daily (Patient taking differently: Take 1 tablet by mouth every morning. 1 tablet in the AM for 1 week then 1 twice daily) 60 tablet 1   famotidine (PEPCID) 20 MG tablet Take 1 tablet (20 mg total) by mouth 2 (two) times daily. 60 tablet 2   folic acid (FOLVITE) 1 MG tablet TAKE 1 TABLET BY  MOUTH EVERY DAY 90 tablet 0   levothyroxine (SYNTHROID) 100 MCG tablet TAKE 1 TABLET BY MOUTH EVERY DAY 30 tablet 2   lithium carbonate 150 MG capsule 2 capsules even days of the month and 1 capsule odd days of the month 135 capsule 0   Magnesium 200 MG TABS Take 200 mg by mouth.     Maribavir 200 MG TABS Take by mouth in the morning and at bedtime.     OLANZapine (ZYPREXA) 2.5 MG tablet Take 2.5 mg by mouth at bedtime.     Polyethylene Glycol 3350 (MIRALAX PO) Take 17 g by mouth daily as needed.     prochlorperazine (COMPAZINE) 5 MG tablet TAKE 1 TO 2 TABLETS EVERY 6 HOURS AS NEEDED FOR NAUSEA OR VOMITING FOR UP TO 3 DAYS 60 tablet 1   tacrolimus (PROGRAF) 0.5 MG capsule Take 1.5 mg by mouth in the morning and at bedtime.     traZODone (DESYREL) 50 MG tablet Take 50 mg by mouth at bedtime. 2 TABS hs     No current facility-administered medications for this visit.   Facility-Administered Medications Ordered in Other Visits  Medication Dose Route Frequency Provider Last Rate Last Admin   0.9 %  sodium chloride infusion (Manually program via Guardrails IV Fluids)  250 mL Intravenous Once Thornton Papas B, MD       0.9 %  sodium chloride infusion (Manually program  via Guardrails IV Fluids)  250 mL Intravenous Once Ladene Artist, MD       heparin lock flush 100 unit/mL  250 Units Intracatheter PRN Ladene Artist, MD       sodium chloride flush (NS) 0.9 % injection 10 mL  10 mL Intracatheter PRN Ladene Artist, MD   10 mL at 02/21/19 0945   sodium chloride flush (NS) 0.9 % injection 10 mL  10 mL Intracatheter PRN Ladene Artist, MD       SUMAtriptan (IMITREX) nasal spray 20 mg  20 mg Nasal Once Ladene Artist, MD        Medication Side Effects: None  Allergies: No Known Allergies  Past Medical History:  Diagnosis Date   Anxiety    Cancer (HCC)    CLL   Degenerative disk disease    Neck--chronic pain   Depression    Fibroids    Uterine   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 antibody positive   Hypothyroidism    Lymphocytosis    Mitral valve prolapse    Mitral valve prolapse    Thyroid disease    Hypothyroid    Family History  Problem Relation Age of Onset   Breast cancer Mother    Prostate cancer Father    Colon cancer Neg Hx    Esophageal cancer Neg Hx    Pancreatic cancer Neg Hx    Stomach cancer Neg Hx     Social History   Socioeconomic History   Marital status: Married    Spouse name: Not on file   Number of children: Not on file   Years of education: Not on file   Highest education level: Not on file  Occupational History   Not on file  Tobacco Use   Smoking status: Never   Smokeless tobacco: Never  Vaping Use   Vaping status: Not on file  Substance and Sexual Activity   Alcohol use: Not Currently    Comment: 2 glasses of wine 2 times per week    Drug use: No   Sexual activity: Not on file  Other Topics Concern   Not on file  Social History Narrative   Pt lives in 2 story home with her husband   Has 3 adult children   Masters degree   Works at [employer]   Social Determinants of Corporate investment banker Strain: Low Risk  (09/25/2020)   Received from [facility], [facility]   Overall Financial Resource Strain (CARDIA)    Difficulty of Paying Living Expenses: Not very hard  Food Insecurity: No Food Insecurity (09/25/2020)   Received from [facility], [facility]   Hunger Vital Sign    Worried About Running Out of Food in the Last Year: Never true    Ran Out of Food in the Last Year: Never true  Transportation Needs: No Transportation Needs (09/25/2020)   Received from [facility], [facility]   [facility] - Transportation    Lack of Transportation (Medical): No    Lack of Transportation (Non-Medical): No  Physical Activity: Not on file  Stress: Not on file  Social Connections: Not on file  Intimate Partner Violence: Not on file    Past Medical History, Surgical history, Social  history, and Family history were reviewed and updated as appropriate.   Please see review of systems for further details on the patient's review from today.  Objective:   Physical Exam:  There were no vitals taken for this visit.  Physical Exam Constitutional:      General: She is not in acute distress.    Appearance: She is well-developed.  Musculoskeletal:        General: No deformity.  Neurological:     Mental Status: She is alert and oriented to person, place, and time.     Motor: No tremor.     Coordination: Coordination normal.     Gait: Gait normal.  Psychiatric:        Attention and Perception: Attention and perception normal.        Mood and Affect: Mood is not anxious or depressed. Affect is not labile, tearful or inappropriate.        Speech: Speech normal. Speech is not slurred.        Behavior: Behavior normal.        Thought Content: Thought content normal. Thought content is not delusional. Thought content does not include homicidal or suicidal ideation. Thought content does not include suicidal plan.        Cognition and Memory: Cognition normal.        Judgment: Judgment normal.     Comments: Insight intact. No auditory or visual hallucinations. No delusions.  No anxiety and dep resolved     Lab Review:     Component Value Date/Time   NA 141 04/17/2023 1044   K 4.3 04/17/2023 1044   CL 111 04/17/2023 1044   CO2 24 04/17/2023 1044   GLUCOSE 103 (H) 04/17/2023 1044   BUN 49 (H) 04/17/2023 1044   CREATININE 2.58 (H) 04/17/2023 1044   CREATININE 2.67 (H) 02/21/2023 1436   CALCIUM 9.7 04/17/2023 1044   PROT 7.7 03/15/2022 1301   ALBUMIN 4.2 03/15/2022 1301   AST 25 03/15/2022 1301   ALT 30 03/15/2022 1301   ALKPHOS 68 03/15/2022 1301   BILITOT 0.3 03/15/2022 1301   GFRNONAA 20 (L) 04/17/2023 1044   GFRAA >60 12/17/2019 0830       Component Value Date/Time   WBC 3.9 (L) 03/15/2022 1301   WBC 9.9 03/31/2019 0845   RBC 3.03 (L) 03/15/2022 1301    HGB 9.6 (L) 03/15/2022 1301   HGB 14.0 06/27/2016 1038   HCT 29.4 (L) 03/15/2022 1301   HCT 42.0 06/27/2016 1038   PLT 114 (L) 03/15/2022 1301   PLT 205 06/27/2016 1038   MCV 97.0 03/15/2022 1301   MCV 92.9 06/27/2016 1038   MCH 31.7 03/15/2022 1301   MCHC 32.7 03/15/2022 1301   RDW 12.3 03/15/2022 1301   RDW 12.2 06/27/2016 1038   LYMPHSABS 2.1 03/15/2022 1301   LYMPHSABS 21.5 (H) 06/27/2016 1038   MONOABS 0.3 03/15/2022 1301   MONOABS 0.5 06/27/2016 1038   EOSABS 0.3 03/15/2022 1301   EOSABS 0.2 06/27/2016 1038   BASOSABS 0.0 03/15/2022 1301   BASOSABS 0.1 06/27/2016 1038    Lithium Lvl  Date Value Ref Range Status  02/21/2023 0.8 0.6 - 1.2 mmol/L Final  02/21/23 lithium 0.8 on 300 mg daily   Component 08/24/20 08/14/20 04/02/20 01/07/20  Lithium Lvl 0.4 0.6 0.8 0.6   on 300 mg daily  Component 12/19/22 11/14/22 10/10/22 09/06/22 08/08/22 07/14/22  Sodium 143 142 141 141 141 145  Potassium 4.2 4.3 4.0 4.2 5.2 High  4.8  Chloride 113 High  114 High  113 High  113 High  113 High  117 High   CO2 25.0 20.0 24.0 25.0 24.0  22.0  Anion Gap 5 8 4  Low  3 Low  4 Low  6  BUN 46 High  58 High  46 High  50 High  51 High  48 High   Creatinine 2.09 High  2.54 High  2.43 High  2.65 High  2.90 High  2.73 High   BUN/Creatinine Ratio 22 23 19 19 18 18   eGFR CKD-EPI (2021) Female 26 Low   21 Low   22 Low   20 Low   18 Low   19 Low      No results found for: "PHENYTOIN", "PHENOBARB", "VALPROATE", "CBMZ"   .res Assessment: Plan:    Major depressive disorder, recurrent episode, moderate (HCC) - Plan: Lithium level  Panic disorder with agoraphobia  PTSD (post-traumatic stress disorder)  Insomnia due to mental condition  Lithium use   30 min face to face time with patient was spent on counseling and oordination of care. We discussed Meagan Walsh has had history of 2 very severe prolonged depressions that failed to respond to multiple psychiatric medications but then responded to  lithium potentiation.  She has been through recent winters with no difficulty.  She has a very strong seasonal pattern.  She previously failed to respond to several antidepressants including her last antidepressant which was Trintellix 20 mg until lithium 300 mg daily was added and she went into remission. She remained in remission through treatment for cancer during which time she had to stop all her psychiatric medications.  She has remained in remission until this January 2024 .[name]  Her cancer has remained in remission.  Her depression resolved again when here in January 2024 since resuming lithium 300 mg nightly.  However her cancer doctor also resumed olanzapine 2.5 mg nightly at about the same time and there is a question as to which med change helped her.  Because of her high creatinine level we stopped the lithium in the hopes that the olanzapine was what actually improved her mood.   However dep relapse after flu June 2024.  Has resolved again with Auvelity and increase in lithium to 300 mg daily with level 0.8 02/21/23. August 2024 we reduced lithium to 150/300 every other day The use of lithium is more risky given the elevated creatinine.  Disc again SS lithium toxicity and this risk greater given kidney status.    Best options given his TRD novel AD Auvelity or resuming lithium.  Given the severity of her sx and how desperate she felt to get better soon, we did both.  Lithium has helped in the past.   Lithium 0.8 on 02/21/23  on 300 mg HS.  Cr 2.4 on 10/01/22.  The elevation is not new.  Continue olanzapine 2.5 mg HS for mood Continue trazodone 100 HS Alprazolam prn HS.   continue Auvelity for depression 1 daily. Continue lithium to 2 capsules on even days of the month and 1 capsule on odd days of the month. Check level every 3 mos.   Call if recurrence of depression  FU 3 mos  Meredith Staggers, MD, DFAPA   Please see After Visit Summary for patient specific instructions.  Future  Appointments  Date Time Provider Department Center  07/26/2023  2:00 PM Cottle, Steva Ready., MD [facility] None      Orders Placed This Encounter  Procedures   Lithium level      -------------------------------

## 2023-05-08 ENCOUNTER — Ambulatory Visit: Admit: 2023-05-08 | Discharge: 2023-05-09 | Payer: PRIVATE HEALTH INSURANCE

## 2023-05-08 ENCOUNTER — Other Ambulatory Visit: Admit: 2023-05-08 | Discharge: 2023-05-09 | Payer: PRIVATE HEALTH INSURANCE

## 2023-05-08 DIAGNOSIS — R918 Other nonspecific abnormal finding of lung field: Secondary | ICD-10-CM | POA: Diagnosis not present

## 2023-05-08 DIAGNOSIS — Z79891 Long term (current) use of opiate analgesic: Secondary | ICD-10-CM | POA: Diagnosis not present

## 2023-05-08 DIAGNOSIS — Z23 Encounter for immunization: Secondary | ICD-10-CM | POA: Diagnosis not present

## 2023-05-08 DIAGNOSIS — Z79890 Hormone replacement therapy (postmenopausal): Secondary | ICD-10-CM | POA: Diagnosis not present

## 2023-05-08 DIAGNOSIS — N184 Chronic kidney disease, stage 4 (severe): Secondary | ICD-10-CM | POA: Diagnosis not present

## 2023-05-08 DIAGNOSIS — G40409 Other generalized epilepsy and epileptic syndromes, not intractable, without status epilepticus: Secondary | ICD-10-CM | POA: Diagnosis not present

## 2023-05-08 DIAGNOSIS — Z9481 Bone marrow transplant status: Secondary | ICD-10-CM | POA: Diagnosis not present

## 2023-05-08 DIAGNOSIS — D84822 Immunodeficiency due to external causes: Secondary | ICD-10-CM | POA: Diagnosis not present

## 2023-05-08 DIAGNOSIS — K5909 Other constipation: Secondary | ICD-10-CM | POA: Diagnosis not present

## 2023-05-08 DIAGNOSIS — Z931 Gastrostomy status: Secondary | ICD-10-CM | POA: Diagnosis not present

## 2023-05-08 DIAGNOSIS — Z5181 Encounter for therapeutic drug level monitoring: Secondary | ICD-10-CM | POA: Diagnosis not present

## 2023-05-08 DIAGNOSIS — C9110 Chronic lymphocytic leukemia of B-cell type not having achieved remission: Secondary | ICD-10-CM | POA: Diagnosis not present

## 2023-05-08 DIAGNOSIS — Z79899 Other long term (current) drug therapy: Secondary | ICD-10-CM | POA: Diagnosis not present

## 2023-05-08 DIAGNOSIS — F5000 Anorexia nervosa, unspecified: Secondary | ICD-10-CM | POA: Diagnosis not present

## 2023-05-08 DIAGNOSIS — Z79621 Long term (current) use of calcineurin inhibitor: Secondary | ICD-10-CM | POA: Diagnosis not present

## 2023-05-08 DIAGNOSIS — Z9484 Stem cells transplant status: Secondary | ICD-10-CM | POA: Diagnosis not present

## 2023-05-08 DIAGNOSIS — F319 Bipolar disorder, unspecified: Secondary | ICD-10-CM | POA: Diagnosis not present

## 2023-05-08 DIAGNOSIS — J101 Influenza due to other identified influenza virus with other respiratory manifestations: Secondary | ICD-10-CM | POA: Diagnosis not present

## 2023-05-08 MED ORDER — TACROLIMUS 0.5 MG CAPSULE, IMMEDIATE-RELEASE
ORAL_CAPSULE | Freq: Every day | ORAL | 2 refills | 60 days | Status: CP
Start: 2023-05-08 — End: ?

## 2023-05-08 MED ORDER — SODIUM ZIRCONIUM CYCLOSILICATE 10 GRAM ORAL POWDER PACKET
PACK | ORAL | 1 refills | 0.00000 days | Status: CP
Start: 2023-05-08 — End: 2023-05-08

## 2023-05-12 DIAGNOSIS — Z01818 Encounter for other preprocedural examination: Secondary | ICD-10-CM | POA: Diagnosis not present

## 2023-05-12 DIAGNOSIS — Z7682 Awaiting organ transplant status: Secondary | ICD-10-CM | POA: Diagnosis not present

## 2023-05-12 DIAGNOSIS — Z87891 Personal history of nicotine dependence: Secondary | ICD-10-CM | POA: Diagnosis not present

## 2023-05-12 DIAGNOSIS — R918 Other nonspecific abnormal finding of lung field: Secondary | ICD-10-CM | POA: Diagnosis not present

## 2023-05-12 DIAGNOSIS — Z122 Encounter for screening for malignant neoplasm of respiratory organs: Secondary | ICD-10-CM | POA: Diagnosis not present

## 2023-05-13 MED ORDER — OLANZAPINE 5 MG TABLET
ORAL_TABLET | Freq: Every evening | ORAL | 1 refills | 0 days
Start: 2023-05-13 — End: ?

## 2023-05-14 MED ORDER — OLANZAPINE 5 MG TABLET
ORAL_TABLET | Freq: Every evening | ORAL | 1 refills | 90 days | Status: CP
Start: 2023-05-14 — End: ?

## 2023-05-20 DIAGNOSIS — Z9484 Stem cells transplant status: Principal | ICD-10-CM

## 2023-05-20 MED ORDER — LEVOTHYROXINE 100 MCG TABLET
ORAL_TABLET | 1 refills | 0 days
Start: 2023-05-20 — End: ?

## 2023-05-21 MED ORDER — LEVOTHYROXINE 100 MCG TABLET
ORAL_TABLET | Freq: Every day | ORAL | 1 refills | 90 days | Status: CP
Start: 2023-05-21 — End: ?

## 2023-05-22 DIAGNOSIS — Z01818 Encounter for other preprocedural examination: Secondary | ICD-10-CM | POA: Diagnosis not present

## 2023-05-22 DIAGNOSIS — R918 Other nonspecific abnormal finding of lung field: Secondary | ICD-10-CM | POA: Diagnosis not present

## 2023-05-24 ENCOUNTER — Other Ambulatory Visit: Payer: Self-pay | Admitting: Psychiatry

## 2023-05-24 DIAGNOSIS — Z79899 Other long term (current) drug therapy: Secondary | ICD-10-CM | POA: Diagnosis not present

## 2023-05-24 DIAGNOSIS — F331 Major depressive disorder, recurrent, moderate: Secondary | ICD-10-CM | POA: Diagnosis not present

## 2023-05-25 LAB — BASIC METABOLIC PANEL
BUN/Creatinine Ratio: 19 (calc) (ref 6–22)
BUN: 50 mg/dL — ABNORMAL HIGH (ref 7–25)
CO2: 23 mmol/L (ref 20–32)
Calcium: 9.3 mg/dL (ref 8.6–10.4)
Chloride: 108 mmol/L (ref 98–110)
Creat: 2.63 mg/dL — ABNORMAL HIGH (ref 0.50–1.05)
Glucose, Bld: 105 mg/dL — ABNORMAL HIGH (ref 65–99)
Potassium: 4.7 mmol/L (ref 3.5–5.3)
Sodium: 136 mmol/L (ref 135–146)

## 2023-05-25 LAB — LITHIUM LEVEL: Lithium Lvl: 1 mmol/L (ref 0.6–1.2)

## 2023-05-28 DIAGNOSIS — N186 End stage renal disease: Secondary | ICD-10-CM | POA: Diagnosis not present

## 2023-05-28 DIAGNOSIS — C9210 Chronic myeloid leukemia, BCR/ABL-positive, not having achieved remission: Secondary | ICD-10-CM | POA: Diagnosis not present

## 2023-05-28 DIAGNOSIS — Z79899 Other long term (current) drug therapy: Secondary | ICD-10-CM | POA: Diagnosis not present

## 2023-05-28 DIAGNOSIS — I10 Essential (primary) hypertension: Secondary | ICD-10-CM | POA: Diagnosis not present

## 2023-05-28 DIAGNOSIS — Z7682 Awaiting organ transplant status: Secondary | ICD-10-CM | POA: Diagnosis not present

## 2023-05-28 DIAGNOSIS — N2581 Secondary hyperparathyroidism of renal origin: Secondary | ICD-10-CM | POA: Diagnosis not present

## 2023-05-28 DIAGNOSIS — N184 Chronic kidney disease, stage 4 (severe): Secondary | ICD-10-CM | POA: Diagnosis not present

## 2023-05-28 DIAGNOSIS — I129 Hypertensive chronic kidney disease with stage 1 through stage 4 chronic kidney disease, or unspecified chronic kidney disease: Secondary | ICD-10-CM | POA: Diagnosis not present

## 2023-05-28 DIAGNOSIS — Z9189 Other specified personal risk factors, not elsewhere classified: Secondary | ICD-10-CM | POA: Diagnosis not present

## 2023-05-28 DIAGNOSIS — D619 Aplastic anemia, unspecified: Secondary | ICD-10-CM | POA: Diagnosis not present

## 2023-05-28 DIAGNOSIS — Z01818 Encounter for other preprocedural examination: Secondary | ICD-10-CM | POA: Diagnosis not present

## 2023-05-29 DIAGNOSIS — Z1151 Encounter for screening for human papillomavirus (HPV): Secondary | ICD-10-CM | POA: Diagnosis not present

## 2023-05-29 DIAGNOSIS — Z01818 Encounter for other preprocedural examination: Secondary | ICD-10-CM | POA: Diagnosis not present

## 2023-05-29 DIAGNOSIS — Z124 Encounter for screening for malignant neoplasm of cervix: Secondary | ICD-10-CM | POA: Diagnosis not present

## 2023-05-29 DIAGNOSIS — Z01419 Encounter for gynecological examination (general) (routine) without abnormal findings: Secondary | ICD-10-CM | POA: Diagnosis not present

## 2023-05-29 DIAGNOSIS — Z681 Body mass index (BMI) 19 or less, adult: Secondary | ICD-10-CM | POA: Diagnosis not present

## 2023-05-30 DIAGNOSIS — E039 Hypothyroidism, unspecified: Secondary | ICD-10-CM | POA: Diagnosis not present

## 2023-05-30 DIAGNOSIS — R918 Other nonspecific abnormal finding of lung field: Secondary | ICD-10-CM | POA: Diagnosis not present

## 2023-05-30 DIAGNOSIS — J984 Other disorders of lung: Secondary | ICD-10-CM | POA: Diagnosis not present

## 2023-05-30 DIAGNOSIS — Z79899 Other long term (current) drug therapy: Secondary | ICD-10-CM | POA: Diagnosis not present

## 2023-05-30 DIAGNOSIS — Z79890 Hormone replacement therapy (postmenopausal): Secondary | ICD-10-CM | POA: Diagnosis not present

## 2023-06-06 NOTE — Progress Notes (Signed)
Lithium level 1.0 on 150/300 mg every other day. Consider reduction to 150 mg daily. BMP stable. Consider reduction afte rthe holidays. Meredith Staggers, MD, DFAPA

## 2023-06-12 DIAGNOSIS — N184 Chronic kidney disease, stage 4 (severe): Secondary | ICD-10-CM | POA: Diagnosis not present

## 2023-06-12 DIAGNOSIS — I10 Essential (primary) hypertension: Secondary | ICD-10-CM | POA: Diagnosis not present

## 2023-06-12 DIAGNOSIS — Z1159 Encounter for screening for other viral diseases: Secondary | ICD-10-CM | POA: Diagnosis not present

## 2023-06-13 ENCOUNTER — Ambulatory Visit: Admit: 2023-06-13 | Discharge: 2023-06-13 | Payer: BLUE CROSS/BLUE SHIELD

## 2023-06-13 ENCOUNTER — Other Ambulatory Visit: Admit: 2023-06-13 | Discharge: 2023-06-13 | Payer: BLUE CROSS/BLUE SHIELD

## 2023-06-13 DIAGNOSIS — Z9484 Stem cells transplant status: Secondary | ICD-10-CM | POA: Diagnosis not present

## 2023-06-13 DIAGNOSIS — D84822 Immunocompromised state associated with stem cell transplant (CMS-HCC): Principal | ICD-10-CM

## 2023-06-13 DIAGNOSIS — C9110 Chronic lymphocytic leukemia of B-cell type not having achieved remission: Secondary | ICD-10-CM | POA: Diagnosis not present

## 2023-06-13 DIAGNOSIS — Z5181 Encounter for therapeutic drug level monitoring: Secondary | ICD-10-CM | POA: Diagnosis not present

## 2023-06-13 DIAGNOSIS — Z48298 Encounter for aftercare following other organ transplant: Secondary | ICD-10-CM | POA: Diagnosis not present

## 2023-06-13 DIAGNOSIS — Z9481 Bone marrow transplant status: Principal | ICD-10-CM

## 2023-06-13 MED ORDER — ATOVAQUONE 750 MG/5 ML ORAL SUSPENSION
Freq: Every day | ORAL | 5 refills | 30 days | Status: CP
Start: 2023-06-13 — End: ?

## 2023-06-14 ENCOUNTER — Other Ambulatory Visit: Payer: Self-pay | Admitting: Psychiatry

## 2023-06-14 DIAGNOSIS — F331 Major depressive disorder, recurrent, moderate: Secondary | ICD-10-CM

## 2023-06-25 ENCOUNTER — Ambulatory Visit: Payer: BC Managed Care – PPO | Admitting: Psychiatry

## 2023-06-27 DIAGNOSIS — J209 Acute bronchitis, unspecified: Secondary | ICD-10-CM | POA: Diagnosis not present

## 2023-06-27 DIAGNOSIS — R059 Cough, unspecified: Secondary | ICD-10-CM | POA: Diagnosis not present

## 2023-07-05 DIAGNOSIS — J209 Acute bronchitis, unspecified: Secondary | ICD-10-CM | POA: Diagnosis not present

## 2023-07-05 DIAGNOSIS — R058 Other specified cough: Secondary | ICD-10-CM | POA: Diagnosis not present

## 2023-07-05 DIAGNOSIS — R062 Wheezing: Secondary | ICD-10-CM | POA: Diagnosis not present

## 2023-07-05 DIAGNOSIS — C9110 Chronic lymphocytic leukemia of B-cell type not having achieved remission: Principal | ICD-10-CM

## 2023-07-09 DIAGNOSIS — N184 Chronic kidney disease, stage 4 (severe): Secondary | ICD-10-CM | POA: Diagnosis not present

## 2023-07-09 DIAGNOSIS — I10 Essential (primary) hypertension: Secondary | ICD-10-CM | POA: Diagnosis not present

## 2023-07-11 MED ORDER — TRAZODONE 50 MG TABLET
ORAL_TABLET | ORAL | 2 refills | 0.00 days
Start: 2023-07-11 — End: ?

## 2023-07-12 ENCOUNTER — Ambulatory Visit: Admit: 2023-07-12 | Discharge: 2023-07-12 | Payer: BLUE CROSS/BLUE SHIELD

## 2023-07-12 ENCOUNTER — Ambulatory Visit
Admit: 2023-07-12 | Discharge: 2023-07-12 | Payer: BLUE CROSS/BLUE SHIELD | Attending: Critical Care Medicine | Primary: Critical Care Medicine

## 2023-07-12 ENCOUNTER — Other Ambulatory Visit: Admit: 2023-07-12 | Discharge: 2023-07-12 | Payer: BLUE CROSS/BLUE SHIELD

## 2023-07-12 DIAGNOSIS — Z79899 Other long term (current) drug therapy: Secondary | ICD-10-CM | POA: Diagnosis not present

## 2023-07-12 DIAGNOSIS — Z9484 Stem cells transplant status: Secondary | ICD-10-CM | POA: Diagnosis not present

## 2023-07-12 DIAGNOSIS — I10 Essential (primary) hypertension: Secondary | ICD-10-CM | POA: Diagnosis not present

## 2023-07-12 DIAGNOSIS — N184 Chronic kidney disease, stage 4 (severe): Secondary | ICD-10-CM | POA: Diagnosis not present

## 2023-07-12 DIAGNOSIS — C9110 Chronic lymphocytic leukemia of B-cell type not having achieved remission: Secondary | ICD-10-CM | POA: Diagnosis not present

## 2023-07-12 DIAGNOSIS — Z931 Gastrostomy status: Secondary | ICD-10-CM | POA: Diagnosis not present

## 2023-07-12 DIAGNOSIS — Z9481 Bone marrow transplant status: Principal | ICD-10-CM

## 2023-07-13 MED ORDER — TRAZODONE 50 MG TABLET
ORAL_TABLET | ORAL | 2 refills | 0.00 days | Status: CP
Start: 2023-07-13 — End: ?

## 2023-07-26 ENCOUNTER — Ambulatory Visit: Payer: BC Managed Care – PPO | Admitting: Psychiatry

## 2023-07-26 ENCOUNTER — Encounter: Payer: Self-pay | Admitting: Psychiatry

## 2023-07-26 DIAGNOSIS — F5105 Insomnia due to other mental disorder: Secondary | ICD-10-CM

## 2023-07-26 DIAGNOSIS — F331 Major depressive disorder, recurrent, moderate: Secondary | ICD-10-CM

## 2023-07-26 DIAGNOSIS — Z79899 Other long term (current) drug therapy: Secondary | ICD-10-CM

## 2023-07-26 DIAGNOSIS — F4310 Post-traumatic stress disorder, unspecified: Secondary | ICD-10-CM

## 2023-07-26 DIAGNOSIS — F4001 Agoraphobia with panic disorder: Secondary | ICD-10-CM

## 2023-07-26 MED ORDER — LITHIUM CARBONATE ER 450 MG PO TBCR: EXTENDED_RELEASE_TABLET | ORAL | 0 refills | Status: DC

## 2023-07-26 MED ORDER — ALPRAZOLAM 0.25 MG PO TABS: ORAL_TABLET | ORAL | 2 refills | Status: DC

## 2023-07-26 NOTE — Progress Notes (Signed)
 Meagan Walsh [id number] February 02, 1961 63 y.o.    Subjective:   Patient ID:  Meagan Walsh is a 63 y.o. (DOB 1961-02-02) female.  Chief Complaint:  Chief Complaint  Patient presents with   Follow-up   Depression    Meagan Walsh presents to the office today for follow-up of bipolar depression.  seen in January 2020.  Mood was stable and no meds were changed.   seen May 2020.  Lithium  level was 0.68.  Mood was stable and no meds were changed.  Discussed her care with her oncologist Dr. Cloretta in June 2020.  She was dealing with pancytopenia and it was requested that she wean off lamotrigine  to see if it was contributing.  That was done.  August 11, 2019 another conversation with Dr. Cloretta.  She had elevated lithium  of 2.1 on August 08, 2019.  She had lost some weight and was having some nausea and vomiting.  Lithium  was held for a few days then restarted at 300 mg daily.  Lithium  levels have been checked frequently since that time.  seen September 24, 2019.  She was not depressed but struggling with nausea and poor appetite and the following changes were made to see if it would help. In view of chronic nausea and weight loss despite med changes consider Trintellix .  Trintellix  does cause nausea about 10% of people. It was tolerated in the past. Reduce Trintellix  to 10 mg daily. Add mirtazapine  30 mg tablet at night which could help appetite and potentially replace Trintellix . If nausea is not resolved at follow up then stop Trintellix .  Call if depression worsens.  10/29/2019 appt noted: Did fine with med changes.  No problems with reduction in Trintellix .  Gained 11# since here.  At a good weight.  Wonders about stopping it for awhile. Good appetite.  No nausea now.  Still struggling with  CLL.  Needs bone marrow transplant but hasn't been able to occur yet and will be done in [place] with her living there for 3 mos.  Tired of being in limbo.  Rare Xanax  used as nausea med.   Using Ambien  at HS.  Sleep alright.   In spite of stress has not been depressed.  Patient reports stable mood and denies depressed or irritable moods.  Patient denies any recent difficulty with anxiety.  Patient denies difficulty with sleep initiation or maintenance with Ambien . Denies appetite disturbance.  No weight loss.  Patient reports that energy and motivation have been good.  Patient denies any difficulty with concentration.  Patient denies any suicidal ideation. Plan: Continue Trintellix  to 10 mg daily. Continue or stop mirtazapine  30 mg tablet at night per her request. Weight loss resolved.  10/22/2020 appt noted: 2nd bone marrow transplant in [place] in Jan. Will be there at least a couple of more months. Pending lithium  level from today. Lost sig weight this year about 108#.  Feeding tube. Restarted mirtazapine  30 HS about a week ago. Has had a lot of nausea.   Takes alprazolam  this week DT panicky feelings like walls caving in.  Worry over wasting away.  Seemed to help her.  Last day or 2 only needed one.  Don't think I've been depressed, certainly not like in the past.  More like anxiety feeling trapped.  In general feels ok. Still on trintellix  10 HS, lithium  300 mg HS, Ambien  5-10 mg HS. Sleep is pretty good.  Now labs 3 times per week and adjusting antirejection med.  Tolerating psych meds as far as known bc no history of SE with psych meds.  03/25/2021 TC with oncology: Return telephone call to oncologist at [facility] Dr. Sharlot. Reports that the patient is not eating well and has required a G-tube as well as a central line.  Dr. Sharlot believes the patient has developed anorexia nervosa and is restricting not only calories but also fluids.  There is no other reasons for the gastric tube or further central line other than maintaining hydration and nutrition because she will not take in enough orally. The patient is no longer on mirtazapine  but is still on Trintellix .   Suggested the oncologist restart the mirtazapine  30 mg nightly because it helped the patient gain weight before.  We will get her in to the office as soon as possible.  If in fact she does have symptoms of any her anorexia nervosa then she will need more intensive therapy.  Dr. Sharlot believes she may need an inpatient anorexia nervosa program.  04/01/2021 appt noted: Covid 03/04/21.   She's aware oncology is concerned she might have anorexia.  Hard time gaining weight.  Full so fast and when try to push it.  When in hospital went so long without eating.  Has feeding tube and high calorie food gives her diarrhea. Doesn't remember why she stopped mirtazapine . I'm too thin and hate the way I look.  Doesn't want to lose more bc of health danger.  No thirst and relying on Pick line for hydration getting IV fluids daily.   No history of EDO when younger. Not depressed.  Not anxious unusuallly.  It is better now.  Sleep is OK and about 9 hours nightly.  Still takes Ambien  to go to sleep.  04/15/21 appt noted: Mirtazapine  helped appetite and eaten more.  Diarrhea 2 days and have been off.  Might have gained a little.  A little better with fluids but not great.  Pick line removed.  Put fluids in GI tube.   No problems with mirtazapine . Sometimes has diarrhea but not usually. Weigh herself.   08/21/2022 urgent appointment noted: Has not been seen in some time and had weaned off all psych meds.  Has become more urgently depressed. No psych meds except trazodone  for sleep.  No other psych meds for a year.. Regained wt lost in chemo.  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  Still low WBC. Called since early January more blue and not getting out of house.  Got Covid 2 weeks ago and recovered.  No post-Covid fatigue. Stress son in crisis in [place] and needs to fly out to help.  Fears going back into depression and got scared.  Calling a therpist soon.  Too much free time.  No SI.  Thinks it's Ok  to resume lithium  again as she stopped while in more intensive chemo.  Those medical concerns are no longer a problem.s   Sometimes THC gummy to help her feel calmer and sleep better prn.  2 times a month has taken alprazolam .   Appetite is oK Plan: Resume lithium  300 mg HS  10/11/22 appt noted: I feel really good.  Dr. Carter bone marrow doctor thinks benefit due to olanzapine  2.5 mg restarted early Feb after stopping it in Nov.  Was taking as an antinausea med. My lifestyle more conducive to not being depressed.   Now more active and got depressed after Covid. Meds: lithium  300 mg HS, olanzapine  2.5 mg HS, trazodone   100 mg HS Patient reports stable mood and denies depressed or irritable moods.  Patient denies any recent difficulty with anxiety.  Patient denies difficulty with sleep initiation or maintenance. Denies appetite disturbance.  Patient reports that energy and motivation have been good.  Patient denies any difficulty with concentration.  Patient denies any suicidal ideation. Plan: Hold lithium  300 mg HS DT kidney function, Cr 2.4 on 10/01/22.  The elevation is not new. Continue olanzapine  2.5 mg HS for mood Continue trazodone  100 HS Alprazolam  prn   12/20/22 appt noted: Psych med: olanzapine  2.5 mg taking at 8 PM, trazodone  50-100 mg HS prn.  No other antidepressant. Delta 9 gummies. No lithium  for a couple of mos. Still trouble falling asleep.  Goes to bed 1030.   Not dep.  But not enough going on right now.   Misses kids in [place].  Able to travel now.  Helps. Still on some anti-rejection meds.   Stage 4 kidney dz.  Will be on transplant list. Plan: no changes  02/14/23 TC resumed lithium  150 mg daily for 2 weeks without benefit.  Wonders about increasing the dose.   Urgent appt sched instead Feels scared of going into deep dep again.  Getting worse. Started after flu and had to stay in the house a couple of weeks and then the dep began coming back.  Started 19 June.   For a  couple of mos isolated more and bored.  Not busy enough.  Somewhat down over kids in [place] and misses them.  02/16/23 urgent appt noted: Feels scared of going into deep dep again.  Getting worse. Started after flu and had to stay in the house a couple of weeks and then the dep began coming back.  Started 19 June.   For a couple of mos isolated more and bored.  Not busy enough.  Somewhat down over kids in [place] and misses them. Anxiety ridden terrified depression.   Not eating great since dep.  Not sleeping well either.  Lie there and ruminate.   Plan: Start Auvelity  for depression, 1 in the AM for 1 week then 1 twice daily. Increase lithium  to 300 mg or 2 of the 150 mg capsules. This worked before.  03/09/23  appt noted: Started feeling better after 3 days and gradually better thereafter.  90% better with depression .  Feels a little spacey and H notices. Some weakness.  Went so long without exercising. Not worse she thinks.   No dizziness.  No SE.  Minimal nausea except at night and history of this.  No other concerns.   Plan: Continue olanzapine  2.5 mg HS for mood Continue trazodone  100 HS Alprazolam  prn HS.   continue Auvelity  for depression 1 twice daily. Reduce lithium  to 2 capsules on even days of the month and 1 capsule on odd days of the month.  Con Dawna DASEN, CMA  to Me    03/15/23  9:47 AM Note Patient reports severe constipation. She was advised by her GI provider to take Miralax. Said she took 2 doses for 2 days and then was told to take every hour yesterday. She has had no results thus far. She called GI again and she reports that Auvelity  could be the cause. Asking for guidance.     Me  to Con Dawna DASEN, CMA  CC   03/15/23  5:53 PM Note Auvelity  has a 4% risk constipation and 6% risk diarrhea. Stop Auvelity  until the constipation resolves.  Take Miralax daily.  Then resume 1 Auvelity  in the AM     04/25/23 appt noted: Psych meds: Auvelity  1 every morning, lithium  150  mg capsule 2 alternating with 1 every other day, olanzapine  2.5 nightly. Doing well .  Not dep.  Tolerating meds. Constipation resolved so far. Went to [place] and [place] visiting son. H doing well.  Going to wedding in [place].  He's still working.   Sleep and appetite are good.  Geoffry Lorene KANDICE Mickey., MD at 05/24/2023 11:59 PM  Status: Signed  Lithium  level 1.0 on 150/300 mg every other day. Consider reduction to 150 mg daily. BMP stable. Consider reduction afte rthe holidays. Lorene Geoffry, MD, DFAPA     07/26/23 appt noted: Psych meds: Auvelity  1 every morning, lithium  150 mg capsule 2 alternating with 1 every other day, stopped olanzapine  2.5 nightly, trazodone  50 mg HS.SABRA Asks for alprazolam  prn RX.   Patient reports stable mood and denies depressed or irritable moods.  Patient denies any recent difficulty with anxiety.  Patient denies difficulty with sleep initiation or maintenance usually. Denies appetite disturbance.  Patient reports that energy and motivation have been good.  Patient denies any difficulty with concentration.  Patient denies any suicidal ideation.   2 severe episodes depression in life that last a full year.  Multiple failed prior medication trials include but are not limited to the following:   duloxetine, Viibryd,  Trintellix  20 mg failed until lithium  was added Rexulti 0.5 mg caused restlessness, Abilify, Vraylar, Mirtazapine  30 helped appetite. Trazodone  100 mg HS Olanzapine  2.5 mg HS  Twin boys 28 , D 29  Review of Systems:  Review of Systems  Constitutional:  Negative for appetite change, fatigue and unexpected weight change.  Gastrointestinal:  Negative for abdominal pain, diarrhea, nausea and vomiting.  Neurological:  Negative for dizziness and tremors.  Psychiatric/Behavioral:  Negative for agitation, behavioral problems, confusion, decreased concentration, dysphoric mood, hallucinations, self-injury, sleep disturbance and suicidal ideas. The  patient is not nervous/anxious and is not hyperactive.     Medications: I have reviewed the patient's current medications.  Current Outpatient Medications  Medication Sig Dispense Refill   acetaminophen  (TYLENOL ) 325 MG tablet Take 650 mg by mouth every 6 (six) hours as needed.     Dextromethorphan-buPROPion ER (AUVELITY ) 45-105 MG TBCR 1 tablet in the AM for 1 week then 1 twice daily (Patient taking differently: Take 1 tablet by mouth every morning. 1 tablet in the AM for 1 week then 1 twice daily) 60 tablet 1   famotidine  (PEPCID ) 20 MG tablet Take 1 tablet (20 mg total) by mouth 2 (two) times daily. 60 tablet 2   folic acid  (FOLVITE ) 1 MG tablet TAKE 1 TABLET BY MOUTH EVERY DAY 90 tablet 0   levothyroxine  (SYNTHROID ) 100 MCG tablet TAKE 1 TABLET BY MOUTH EVERY DAY 30 tablet 2   lithium  carbonate (ESKALITH ) 450 MG ER tablet STARTING 10/06/23: 1/2 tablet even days alternating with 150mg  odd days 45 tablet 0   lithium  carbonate 150 MG capsule 2 CAPSULES EVEN DAYS OF THE MONTH AND 1 CAPSULE ODD DAYS OF THE MONTH 135 capsule 0   Magnesium 200 MG TABS Take 200 mg by mouth.     Maribavir 200 MG TABS Take by mouth in the morning and at bedtime.     Polyethylene Glycol 3350 (MIRALAX PO) Take 17 g by mouth daily as needed.     prochlorperazine  (COMPAZINE ) 5 MG tablet TAKE 1 TO 2 TABLETS EVERY 6 HOURS AS NEEDED FOR  NAUSEA OR VOMITING FOR UP TO 3 DAYS 60 tablet 1   tacrolimus  (PROGRAF ) 0.5 MG capsule Take 1.5 mg by mouth in the morning and at bedtime.     traZODone  (DESYREL ) 50 MG tablet Take 50 mg by mouth at bedtime. 2 TABS hs     ALPRAZolam  (XANAX ) 0.25 MG tablet TAKE 1 TO 2 TABLETS BY MOUTH EVERY DAY AS NEEDED FOR ANXIETY 60 tablet 2   OLANZapine  (ZYPREXA ) 2.5 MG tablet Take 2.5 mg by mouth at bedtime. (Patient not taking: Reported on 07/26/2023)     No current facility-administered medications for this visit.   Facility-Administered Medications Ordered in Other Visits  Medication Dose Route  Frequency Provider Last Rate Last Admin   0.9 %  sodium chloride  infusion (Manually program via Guardrails IV Fluids)  250 mL Intravenous Once Sherrill, Gary B, MD       0.9 %  sodium chloride  infusion (Manually program via Guardrails IV Fluids)  250 mL Intravenous Once Sherrill, Gary B, MD       heparin  lock flush 100 unit/mL  250 Units Intracatheter PRN Cloretta Arley NOVAK, MD       sodium chloride  flush (NS) 0.9 % injection 10 mL  10 mL Intracatheter PRN Cloretta Arley NOVAK, MD   10 mL at 02/21/19 0945   sodium chloride  flush (NS) 0.9 % injection 10 mL  10 mL Intracatheter PRN Cloretta Arley NOVAK, MD       SUMAtriptan  (IMITREX ) nasal spray 20 mg  20 mg Nasal Once Sherrill, Gary B, MD        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   Hypothyroidism    Lymphocytosis    Mitral valve prolapse    Mitral valve prolapse    Thyroid  disease    Hypothyroid    Family History  Problem Relation Age of Onset   Breast cancer Mother    Prostate cancer Father    Colon cancer Neg Hx    Esophageal cancer Neg Hx    Pancreatic cancer Neg Hx    Stomach cancer Neg Hx     Social History   Socioeconomic History   Marital status: Married    Spouse name: Not on file   Number of children: Not on file   Years of education: Not on file   Highest education level: Not on file  Occupational History   Not on file  Tobacco Use   Smoking status: Never   Smokeless tobacco: Never  Vaping Use   Vaping status: Not on file  Substance and Sexual Activity   Alcohol use: Not Currently    Comment: 2 glasses of wine 2 times per week    Drug use: No   Sexual activity: Not on file  Other Topics Concern   Not on file  Social History Narrative   Pt lives in 2  story home with her husband   Has 3 adult children   Masters degree   Works at [employer]   Social Drivers of [employer] Strain: Low Risk  (09/25/2020)   [facility]   Overall Financial Resource Strain (CARDIA)  Difficulty of Paying Living Expenses: Not very hard  Food Insecurity: No Food Insecurity (05/28/2023)   Received from [facility]   Hunger Vital Sign    Worried About Running Out of Food in the Last Year: Never true    Ran Out of Food in the Last Year: Never true  Transportation Needs: No Transportation Needs (05/28/2023)   Received from [facility]    In the past 12 months, has lack of transportation kept you from medical appointments or from getting medications?: No    Lack of Transportation (Non-Medical): No  Physical Activity: Not on file  Stress: Not on file  Social Connections: Not on file  Intimate Partner Violence: Not on file    Past Medical History, Surgical history, Social history, and Family history were reviewed and updated as appropriate.   Please see review of systems for further details on the patient's review from today.   Objective:   Physical Exam:  There were no vitals taken for this visit.  Physical Exam Constitutional:      General: She is not in acute distress.    Appearance: She is well-developed.  Musculoskeletal:        General: No deformity.  Neurological:     Mental Status: She is alert and oriented to person, place, and time.     Motor: No tremor.     Coordination: Coordination normal.     Gait: Gait normal.  Psychiatric:        Attention and Perception: Attention and perception normal.        Mood and Affect: Mood is not anxious or depressed. Affect is not tearful or inappropriate.        Speech: Speech normal. Speech is not slurred.        Behavior: Behavior normal.        Thought Content: Thought content normal.  Thought content is not delusional. Thought content does not include homicidal or suicidal ideation. Thought content does not include suicidal plan.        Cognition and Memory: Cognition normal.        Judgment: Judgment normal.     Comments: Insight intact. No auditory or visual hallucinations. No delusions.  No anxiety and dep resolved     Lab Review:     Component Value Date/Time   NA 136 05/24/2023 1429   K 4.7 05/24/2023 1429   CL 108 05/24/2023 1429   CO2 23 05/24/2023 1429   GLUCOSE 105 (H) 05/24/2023 1429   BUN 50 (H) 05/24/2023 1429   CREATININE 2.63 (H) 05/24/2023 1429   CALCIUM 9.3 05/24/2023 1429   PROT 7.7 03/15/2022 1301   ALBUMIN 4.2 03/15/2022 1301   AST 25 03/15/2022 1301   ALT 30 03/15/2022 1301   ALKPHOS 68 03/15/2022 1301   BILITOT 0.3 03/15/2022 1301   GFRNONAA 20 (L) 04/17/2023 1044   GFRAA >60 12/17/2019 0830       Component Value Date/Time   WBC 3.9 (L) 03/15/2022 1301   WBC 9.9 03/31/2019 0845   RBC 3.03 (L) 03/15/2022 1301   HGB 9.6 (L) 03/15/2022 1301   HGB 14.0 06/27/2016 1038   HCT 29.4 (L) 03/15/2022 1301   HCT 42.0 06/27/2016 1038   PLT 114 (L) 03/15/2022 1301   PLT 205 06/27/2016 1038   MCV 97.0 03/15/2022 1301   MCV 92.9 06/27/2016 1038   MCH 31.7 03/15/2022 1301   MCHC 32.7 03/15/2022 1301  RDW 12.3 03/15/2022 1301   RDW 12.2 06/27/2016 1038   LYMPHSABS 2.1 03/15/2022 1301   LYMPHSABS 21.5 (H) 06/27/2016 1038   MONOABS 0.3 03/15/2022 1301   MONOABS 0.5 06/27/2016 1038   EOSABS 0.3 03/15/2022 1301   EOSABS 0.2 06/27/2016 1038   BASOSABS 0.0 03/15/2022 1301   BASOSABS 0.1 06/27/2016 1038    Lithium  Lvl  Date Value Ref Range Status  05/24/2023 1.0 0.6 - 1.2 mmol/L Final  05/24/23 Lithium  level 1.0 on 150/300 mg every other day.  02/21/23 lithium  0.8 on 300 mg daily   Component 08/24/20 08/14/20 04/02/20 01/07/20  Lithium  Lvl 0.4 0.6 0.8 0.6   on 300 mg daily  Component 12/19/22 11/14/22 10/10/22 09/06/22 08/08/22  07/14/22  Sodium 143 142 141 141 141 145  Potassium 4.2 4.3 4.0 4.2 5.2 High  4.8  Chloride 113 High  114 High  113 High  113 High  113 High  117 High   CO2 25.0 20.0 24.0 25.0 24.0 22.0  Anion Gap 5 8 4  Low  3 Low  4 Low  6  BUN 46 High  58 High  46 High  50 High  51 High  48 High   Creatinine 2.09 High  2.54 High  2.43 High  2.65 High  2.90 High  2.73 High   BUN/Creatinine Ratio 22 23 19 19 18 18   eGFR CKD-EPI (2021) Female 26 Low   21 Low   22 Low   20 Low   18 Low   19 Low      No results found for: PHENYTOIN, PHENOBARB, VALPROATE, CBMZ   .res Assessment: Plan:    Major depressive disorder, recurrent episode, moderate (HCC) - Plan: lithium  carbonate (ESKALITH ) 450 MG ER tablet  Panic disorder with agoraphobia - Plan: ALPRAZolam  (XANAX ) 0.25 MG tablet  PTSD (post-traumatic stress disorder) - Plan: ALPRAZolam  (XANAX ) 0.25 MG tablet  Insomnia due to mental condition - Plan: ALPRAZolam  (XANAX ) 0.25 MG tablet  Lithium  use   30 min face to face time with patient was spent on counseling and oordination of care. We discussed Shantay has had history of 2 very severe prolonged depressions that failed to respond to multiple psychiatric medications but then responded to lithium  potentiation.  She has been through recent winters with no difficulty.  She has a very strong seasonal pattern.  She previously failed to respond to several antidepressants including her last antidepressant which was Trintellix  20 mg until lithium  300 mg daily was added and she went into remission. She remained in remission through treatment for cancer during which time she had to stop all her psychiatric medications.  She has remained in remission until this January 2024 .SABRA  Her cancer has remained in remission.  Her depression resolved again when here in January 2024 since resuming lithium  300 mg nightly.  However her cancer doctor also resumed olanzapine  2.5 mg nightly at about the same time and there is a  question as to which med change helped her.  Because of her high creatinine level we stopped the lithium  in the hopes that the olanzapine  was what actually improved her mood.   However dep relapse after flu June 2024.  Has resolved again with Auvelity  and increase in lithium  to 300 mg daily with level 0.8 02/21/23. August 2024 we reduced lithium  to 150/300 every other day The use of lithium  is more risky given the elevated creatinine.  Disc again SS lithium  toxicity and this risk greater given kidney status.  Was  discussed again.    Best options given his TRD novel AD Auvelity  or resuming lithium .  Given the severity of her sx and how desperate she felt to get better soon, we did both.  Lithium  has helped in the past.   Lithium  0.8 on 02/21/23  on 300 mg HS. 05/24/23 Lithium  level 1.0 on 150/300 mg every other day.   Cr 2.4 on 10/01/22.  The elevation is not new.  Continue olanzapine  2.5 mg HS for mood Continue trazodone  100 HS Alprazolam  prn HS.   continue Auvelity  for depression 1 daily. Continue lithium  to 2 capsules on even days of the month and 1 capsule on odd days of the month Until after winter:  then in march reduce to 225/150 mg QOD Check level every 3 mos.   Call if recurrence of depression  FU May  Lorene Macintosh, MD, DFAPA   Please see After Visit Summary for patient specific instructions.  No future appointments.     No orders of the defined types were placed in this encounter.     -------------------------------

## 2023-08-04 ENCOUNTER — Other Ambulatory Visit: Payer: Self-pay | Admitting: Psychiatry

## 2023-08-04 DIAGNOSIS — F331 Major depressive disorder, recurrent, moderate: Secondary | ICD-10-CM

## 2023-08-04 DIAGNOSIS — Z9484 Stem cells transplant status: Principal | ICD-10-CM

## 2023-08-04 MED ORDER — TACROLIMUS 0.5 MG CAPSULE, IMMEDIATE-RELEASE
ORAL_CAPSULE | 2 refills | 0.00 days
Start: 2023-08-04 — End: ?

## 2023-08-06 MED ORDER — TACROLIMUS 0.5 MG CAPSULE, IMMEDIATE-RELEASE
ORAL_CAPSULE | Freq: Every day | ORAL | 2 refills | 30.00 days | Status: CP
Start: 2023-08-06 — End: ?

## 2023-08-06 NOTE — Telephone Encounter (Signed)
 Pt called at 11:34a requesting refill of Auvelity .  She needs to pick up on Tuesday as she is leaving to go out of town on [place].  [id number] - [place], [place] - [address] AT [facility] [address] [place] [address] Phone: [phone]  Fax: [phone]   Next appt 5/14

## 2023-08-07 DIAGNOSIS — F419 Anxiety disorder, unspecified: Secondary | ICD-10-CM | POA: Diagnosis not present

## 2023-08-07 DIAGNOSIS — E039 Hypothyroidism, unspecified: Secondary | ICD-10-CM | POA: Diagnosis not present

## 2023-08-07 DIAGNOSIS — C9110 Chronic lymphocytic leukemia of B-cell type not having achieved remission: Secondary | ICD-10-CM | POA: Diagnosis not present

## 2023-08-07 DIAGNOSIS — N184 Chronic kidney disease, stage 4 (severe): Secondary | ICD-10-CM | POA: Diagnosis not present

## 2023-08-14 ENCOUNTER — Other Ambulatory Visit: Admit: 2023-08-14 | Discharge: 2023-08-14 | Payer: BLUE CROSS/BLUE SHIELD

## 2023-08-14 ENCOUNTER — Ambulatory Visit: Admit: 2023-08-14 | Discharge: 2023-08-14 | Payer: BLUE CROSS/BLUE SHIELD

## 2023-08-14 DIAGNOSIS — J9811 Atelectasis: Secondary | ICD-10-CM | POA: Diagnosis not present

## 2023-08-14 DIAGNOSIS — R918 Other nonspecific abnormal finding of lung field: Secondary | ICD-10-CM | POA: Diagnosis not present

## 2023-08-14 DIAGNOSIS — Z9484 Stem cells transplant status: Secondary | ICD-10-CM | POA: Diagnosis not present

## 2023-08-14 DIAGNOSIS — Z9481 Bone marrow transplant status: Secondary | ICD-10-CM | POA: Diagnosis not present

## 2023-08-14 DIAGNOSIS — C9110 Chronic lymphocytic leukemia of B-cell type not having achieved remission: Secondary | ICD-10-CM | POA: Diagnosis not present

## 2023-08-14 DIAGNOSIS — Z5181 Encounter for therapeutic drug level monitoring: Secondary | ICD-10-CM | POA: Diagnosis not present

## 2023-08-14 DIAGNOSIS — I10 Essential (primary) hypertension: Secondary | ICD-10-CM | POA: Diagnosis not present

## 2023-08-14 DIAGNOSIS — D84822 Immunodeficiency due to external causes: Secondary | ICD-10-CM | POA: Diagnosis not present

## 2023-08-14 MED ORDER — OLANZAPINE 2.5 MG TABLET
ORAL_TABLET | Freq: Every evening | ORAL | 6 refills | 30.00 days | Status: CP
Start: 2023-08-14 — End: ?

## 2023-08-20 DIAGNOSIS — D619 Aplastic anemia, unspecified: Principal | ICD-10-CM

## 2023-08-20 DIAGNOSIS — C9110 Chronic lymphocytic leukemia of B-cell type not having achieved remission: Principal | ICD-10-CM

## 2023-08-21 ENCOUNTER — Other Ambulatory Visit: Payer: Self-pay | Admitting: *Deleted

## 2023-08-21 DIAGNOSIS — C9110 Chronic lymphocytic leukemia of B-cell type not having achieved remission: Secondary | ICD-10-CM

## 2023-08-21 DIAGNOSIS — D619 Aplastic anemia, unspecified: Principal | ICD-10-CM

## 2023-08-21 NOTE — Progress Notes (Signed)
Received fax from [facility] (Dr. Lanae Boast) requesting CMP and CBC/diff weekly x 8. Orders placed and scheduling message sent.

## 2023-08-23 DIAGNOSIS — R918 Other nonspecific abnormal finding of lung field: Secondary | ICD-10-CM | POA: Diagnosis not present

## 2023-08-28 DIAGNOSIS — Z13820 Encounter for screening for osteoporosis: Secondary | ICD-10-CM | POA: Diagnosis not present

## 2023-08-28 LAB — HM DEXA SCAN

## 2023-08-30 ENCOUNTER — Inpatient Hospital Stay: Payer: BC Managed Care – PPO | Attending: Oncology

## 2023-08-30 DIAGNOSIS — C9110 Chronic lymphocytic leukemia of B-cell type not having achieved remission: Secondary | ICD-10-CM | POA: Diagnosis not present

## 2023-08-30 LAB — CMP (CANCER CENTER ONLY)
ALT: 56 U/L — ABNORMAL HIGH (ref 0–44)
AST: 40 U/L (ref 15–41)
Albumin: 3.9 g/dL (ref 3.5–5.0)
Alkaline Phosphatase: 63 U/L (ref 38–126)
Anion gap: 8 (ref 5–15)
BUN: 60 mg/dL — ABNORMAL HIGH (ref 8–23)
CO2: 19 mmol/L — ABNORMAL LOW (ref 22–32)
Calcium: 9.1 mg/dL (ref 8.9–10.3)
Chloride: 109 mmol/L (ref 98–111)
Creatinine: 3.27 mg/dL — ABNORMAL HIGH (ref 0.44–1.00)
GFR, Estimated: 15 mL/min — ABNORMAL LOW (ref >60)
Glucose, Bld: 135 mg/dL — ABNORMAL HIGH (ref 70–99)
Potassium: 4.5 mmol/L (ref 3.5–5.1)
Sodium: 136 mmol/L (ref 135–145)
Total Bilirubin: 0.4 mg/dL (ref 0.0–1.2)
Total Protein: 6.6 g/dL (ref 6.5–8.1)

## 2023-08-30 LAB — CBC WITH DIFFERENTIAL (CANCER CENTER ONLY)
Abs Immature Granulocytes: 0.02 10*3/uL (ref 0.00–0.07)
Basophils Absolute: 0 10*3/uL (ref 0.0–0.1)
Basophils Relative: 1 %
Eosinophils Absolute: 0.2 10*3/uL (ref 0.0–0.5)
Eosinophils Relative: 3 %
HCT: 30.9 % — ABNORMAL LOW (ref 36.0–46.0)
Hemoglobin: 9.7 g/dL — ABNORMAL LOW (ref 12.0–15.0)
Immature Granulocytes: 0 %
Lymphocytes Relative: 35 %
Lymphs Abs: 1.7 10*3/uL (ref 0.7–4.0)
MCH: 31.5 pg (ref 26.0–34.0)
MCHC: 31.4 g/dL (ref 30.0–36.0)
MCV: 100.3 fL — ABNORMAL HIGH (ref 80.0–100.0)
Monocytes Absolute: 0.3 10*3/uL (ref 0.1–1.0)
Monocytes Relative: 5 %
Neutro Abs: 2.8 10*3/uL (ref 1.7–7.7)
Neutrophils Relative %: 56 %
Platelet Count: 137 10*3/uL — ABNORMAL LOW (ref 150–400)
RBC: 3.08 MIL/uL — ABNORMAL LOW (ref 3.87–5.11)
RDW: 13.6 % (ref 11.5–15.5)
WBC Count: 5 10*3/uL (ref 4.0–10.5)
nRBC: 0 % (ref 0.0–0.2)

## 2023-09-03 DIAGNOSIS — D619 Aplastic anemia, unspecified: Principal | ICD-10-CM

## 2023-09-03 DIAGNOSIS — C9110 Chronic lymphocytic leukemia of B-cell type not having achieved remission: Principal | ICD-10-CM

## 2023-09-05 DIAGNOSIS — H2513 Age-related nuclear cataract, bilateral: Secondary | ICD-10-CM | POA: Diagnosis not present

## 2023-09-05 DIAGNOSIS — Q149 Congenital malformation of posterior segment of eye, unspecified: Secondary | ICD-10-CM | POA: Diagnosis not present

## 2023-09-05 DIAGNOSIS — H53143 Visual discomfort, bilateral: Secondary | ICD-10-CM | POA: Diagnosis not present

## 2023-09-05 DIAGNOSIS — H40032 Anatomical narrow angle, left eye: Secondary | ICD-10-CM | POA: Diagnosis not present

## 2023-09-06 ENCOUNTER — Inpatient Hospital Stay: Payer: BC Managed Care – PPO

## 2023-09-07 ENCOUNTER — Telehealth: Payer: Self-pay | Admitting: *Deleted

## 2023-09-07 ENCOUNTER — Telehealth: Payer: Self-pay | Admitting: Oncology

## 2023-09-07 DIAGNOSIS — D619 Aplastic anemia, unspecified: Principal | ICD-10-CM

## 2023-09-07 DIAGNOSIS — C9110 Chronic lymphocytic leukemia of B-cell type not having achieved remission: Principal | ICD-10-CM

## 2023-09-07 NOTE — Telephone Encounter (Signed)
LVM requesting lab on 2/17 before 1 pm per request of hematologist at [facility]. Scheduling message sent.

## 2023-09-07 NOTE — Telephone Encounter (Signed)
Spoke with patient confirming upcoming appointment

## 2023-09-10 ENCOUNTER — Inpatient Hospital Stay: Payer: BC Managed Care – PPO

## 2023-09-10 DIAGNOSIS — C9110 Chronic lymphocytic leukemia of B-cell type not having achieved remission: Secondary | ICD-10-CM | POA: Diagnosis not present

## 2023-09-10 LAB — CMP (CANCER CENTER ONLY)
ALT: 67 U/L — ABNORMAL HIGH (ref 0–44)
AST: 47 U/L — ABNORMAL HIGH (ref 15–41)
Albumin: 4.1 g/dL (ref 3.5–5.0)
Alkaline Phosphatase: 54 U/L (ref 38–126)
Anion gap: 6 (ref 5–15)
BUN: 49 mg/dL — ABNORMAL HIGH (ref 8–23)
CO2: 21 mmol/L — ABNORMAL LOW (ref 22–32)
Calcium: 9.3 mg/dL (ref 8.9–10.3)
Chloride: 109 mmol/L (ref 98–111)
Creatinine: 2.77 mg/dL — ABNORMAL HIGH (ref 0.44–1.00)
GFR, Estimated: 19 mL/min — ABNORMAL LOW (ref >60)
Glucose, Bld: 72 mg/dL (ref 70–99)
Potassium: 4.3 mmol/L (ref 3.5–5.1)
Sodium: 136 mmol/L (ref 135–145)
Total Bilirubin: 0.4 mg/dL (ref 0.0–1.2)
Total Protein: 7.1 g/dL (ref 6.5–8.1)

## 2023-09-10 LAB — CBC WITH DIFFERENTIAL (CANCER CENTER ONLY)
Abs Immature Granulocytes: 0.02 10*3/uL (ref 0.00–0.07)
Basophils Absolute: 0 10*3/uL (ref 0.0–0.1)
Basophils Relative: 1 %
Eosinophils Absolute: 0.1 10*3/uL (ref 0.0–0.5)
Eosinophils Relative: 4 %
HCT: 32.6 % — ABNORMAL LOW (ref 36.0–46.0)
Hemoglobin: 10.2 g/dL — ABNORMAL LOW (ref 12.0–15.0)
Immature Granulocytes: 1 %
Lymphocytes Relative: 39 %
Lymphs Abs: 1.3 10*3/uL (ref 0.7–4.0)
MCH: 31.7 pg (ref 26.0–34.0)
MCHC: 31.3 g/dL (ref 30.0–36.0)
MCV: 101.2 fL — ABNORMAL HIGH (ref 80.0–100.0)
Monocytes Absolute: 0.2 10*3/uL (ref 0.1–1.0)
Monocytes Relative: 6 %
Neutro Abs: 1.7 10*3/uL (ref 1.7–7.7)
Neutrophils Relative %: 49 %
Platelet Count: 131 10*3/uL — ABNORMAL LOW (ref 150–400)
RBC: 3.22 MIL/uL — ABNORMAL LOW (ref 3.87–5.11)
RDW: 13.3 % (ref 11.5–15.5)
WBC Count: 3.4 10*3/uL — ABNORMAL LOW (ref 4.0–10.5)
nRBC: 0 % (ref 0.0–0.2)

## 2023-09-11 ENCOUNTER — Encounter: Payer: Self-pay | Admitting: Family Medicine

## 2023-09-11 ENCOUNTER — Ambulatory Visit: Payer: BC Managed Care – PPO | Admitting: Family Medicine

## 2023-09-11 VITALS — BP 114/78 | HR 83 | Ht 65.0 in | Wt 115.0 lb

## 2023-09-11 DIAGNOSIS — M8580 Other specified disorders of bone density and structure, unspecified site: Secondary | ICD-10-CM

## 2023-09-11 NOTE — Patient Instructions (Signed)
Thank you for coming in today.   Consider osteo strong.   Please get labs in 2 weeks at [facility].   I will talk with your kidney team about options.   Let me know if you have any problems.   I am here for you if you get hurt.

## 2023-09-11 NOTE — Progress Notes (Signed)
Rubin Payor, PhD, LAT, ATC acting as a scribe for Clementeen Graham, MD.  Meagan Walsh is a 63 y.o. female who presents to [facility] at [facility] today for osteoporosis management. 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 Family hx of breast cancer  DEXA scan (date, T-score): 08/28/23: Spine= -1.7, L-FN= -2.1, R-FN= -2.2 Prior treatment: none History of Hip, Spine, or Wrist Fx: none Heart disease or stroke: no Cancer: leukemia Kidney Disease: yes-stage 4 Gastric/Peptic Ulcer: no Gastric bypass surgery: no Severe GERD: no Hx of seizures: yes- x 1, 2023 Age at Menopause: 63y/o Calcium intake: no Vitamin D intake: no Hormone replacement therapy: no Smoking history: never smoked Alcohol: yes- occasional  Exercise: yes-personal trainer bid,  Major dental work in past year: yes Parents with hip/spine fracture: no Height loss: yes: 5'6.25" to 5'5"   Pertinent review of systems: No fevers or chills  Relevant historical information: History of CLL leukemia with bone marrow transplant.  CKD 4 on potential transplant list at [facility] for kidney.   Exam:  BP 114/78   Pulse 83   Ht 5\' 5"  (1.651 m)   Wt 115 lb (52.2 kg)   SpO2 98%   BMI 19.14 kg/m  General: Well Developed, well nourished, and in no acute distress.   MSK: Normal lumbar motion    Lab and Radiology Results Results for orders placed or performed in visit on 09/10/23 (from the past 72 hours)  CMP (Cancer Center only)     Status: Abnormal   Collection Time: 09/10/23 11:49 AM  Result Value Ref Range   Sodium 136 135 - 145 mmol/L   Potassium 4.3 3.5 - 5.1 mmol/L   Chloride 109 98 - 111 mmol/L   CO2 21 (L) 22 - 32 mmol/L   Glucose, Bld 72 70 - 99 mg/dL    Comment: Glucose reference range applies only to samples taken after fasting for at least 8 hours.   BUN 49 (H) 8 - 23 mg/dL   Creatinine 8.11 (H) 9.14 - 1.00 mg/dL   Calcium 9.3 8.9 - 78.2 mg/dL   Total Protein 7.1 6.5 - 8.1 g/dL    Albumin 4.1 3.5 - 5.0 g/dL   AST 47 (H) 15 - 41 U/L   ALT 67 (H) 0 - 44 U/L   Alkaline Phosphatase 54 38 - 126 U/L   Total Bilirubin 0.4 0.0 - 1.2 mg/dL   GFR, Estimated 19 (L) >60 mL/min    Comment: (NOTE) Calculated using the CKD-EPI Creatinine Equation (2021)    Anion gap 6 5 - 15    Comment: Performed at [facility], [address] [place], [place] [address]  CBC with Differential (Cancer Center Only)     Status: Abnormal   Collection Time: 09/10/23 11:49 AM  Result Value Ref Range   WBC Count 3.4 (L) 4.0 - 10.5 K/uL   RBC 3.22 (L) 3.87 - 5.11 MIL/uL   Hemoglobin 10.2 (L) 12.0 - 15.0 g/dL   HCT 30.8 (L) 65.7 - 84.6 %   MCV 101.2 (H) 80.0 - 100.0 fL   MCH 31.7 26.0 - 34.0 pg   MCHC 31.3 30.0 - 36.0 g/dL   RDW 96.2 95.2 - 84.1 %   Platelet Count 131 (L) 150 - 400 K/uL   nRBC 0.0 0.0 - 0.2 %   Neutrophils Relative % 49 %   Neutro Abs 1.7 1.7 - 7.7 K/uL   Lymphocytes Relative 39 %   Lymphs Abs  1.3 0.7 - 4.0 K/uL   Monocytes Relative 6 %   Monocytes Absolute 0.2 0.1 - 1.0 K/uL   Eosinophils Relative 4 %   Eosinophils Absolute 0.1 0.0 - 0.5 K/uL   Basophils Relative 1 %   Basophils Absolute 0.0 0.0 - 0.1 K/uL   Immature Granulocytes 1 %   Abs Immature Granulocytes 0.02 0.00 - 0.07 K/uL    Comment: Performed at [facility], [address] [place], [place] [address]   *Note: Due to a large number of results and/or encounters for the requested time period, some results have not been displayed. A complete set of results can be found in Results Review.   No results found.     Assessment and Plan: 63 y.o. female with osteopenia.  We talked about options.  She is doing some weightbearing exercises.  I think maximizing weightbearing exercises is a good idea.  I recommended osteo strong.  Will go ahead and check vitamin D.  We did talk about medication options.  Her CKD4 is a bit of a barrier to some medicine options like Prolia.  She may  be a candidate for bisphosphonates but I would like to speak with her renal team first.   PDMP not reviewed this encounter. Orders Placed This Encounter  Procedures   VITAMIN D 25 Hydroxy (Vit-D Deficiency, Fractures)    Standing Status:   Future    Expiration Date:   09/10/2024   No orders of the defined types were placed in this encounter.    Discussed warning signs or symptoms. Please see discharge instructions. Patient expresses understanding.   The above documentation has been reviewed and is accurate and complete Clementeen Graham, M.D.

## 2023-09-12 ENCOUNTER — Other Ambulatory Visit: Payer: Self-pay | Admitting: Psychiatry

## 2023-09-12 DIAGNOSIS — F331 Major depressive disorder, recurrent, moderate: Secondary | ICD-10-CM

## 2023-09-13 ENCOUNTER — Other Ambulatory Visit: Payer: Self-pay | Admitting: Family Medicine

## 2023-09-13 ENCOUNTER — Inpatient Hospital Stay: Payer: BC Managed Care – PPO

## 2023-09-13 DIAGNOSIS — C9110 Chronic lymphocytic leukemia of B-cell type not having achieved remission: Secondary | ICD-10-CM | POA: Diagnosis not present

## 2023-09-13 LAB — CBC WITH DIFFERENTIAL (CANCER CENTER ONLY)
Abs Immature Granulocytes: 0.01 10*3/uL (ref 0.00–0.07)
Basophils Absolute: 0 10*3/uL (ref 0.0–0.1)
Basophils Relative: 1 %
Eosinophils Absolute: 0.1 10*3/uL (ref 0.0–0.5)
Eosinophils Relative: 3 %
HCT: 32 % — ABNORMAL LOW (ref 36.0–46.0)
Hemoglobin: 9.9 g/dL — ABNORMAL LOW (ref 12.0–15.0)
Immature Granulocytes: 0 %
Lymphocytes Relative: 38 %
Lymphs Abs: 1.5 10*3/uL (ref 0.7–4.0)
MCH: 31.1 pg (ref 26.0–34.0)
MCHC: 30.9 g/dL (ref 30.0–36.0)
MCV: 100.6 fL — ABNORMAL HIGH (ref 80.0–100.0)
Monocytes Absolute: 0.2 10*3/uL (ref 0.1–1.0)
Monocytes Relative: 5 %
Neutro Abs: 2.1 10*3/uL (ref 1.7–7.7)
Neutrophils Relative %: 53 %
Platelet Count: 132 10*3/uL — ABNORMAL LOW (ref 150–400)
RBC: 3.18 MIL/uL — ABNORMAL LOW (ref 3.87–5.11)
RDW: 13.2 % (ref 11.5–15.5)
WBC Count: 3.9 10*3/uL — ABNORMAL LOW (ref 4.0–10.5)
nRBC: 0 % (ref 0.0–0.2)

## 2023-09-13 LAB — CMP (CANCER CENTER ONLY)
ALT: 59 U/L — ABNORMAL HIGH (ref 0–44)
AST: 40 U/L (ref 15–41)
Albumin: 4.4 g/dL (ref 3.5–5.0)
Alkaline Phosphatase: 59 U/L (ref 38–126)
Anion gap: 6 (ref 5–15)
BUN: 68 mg/dL — ABNORMAL HIGH (ref 8–23)
CO2: 21 mmol/L — ABNORMAL LOW (ref 22–32)
Calcium: 9.3 mg/dL (ref 8.9–10.3)
Chloride: 108 mmol/L (ref 98–111)
Creatinine: 2.92 mg/dL — ABNORMAL HIGH (ref 0.44–1.00)
GFR, Estimated: 17 mL/min — ABNORMAL LOW (ref >60)
Glucose, Bld: 130 mg/dL — ABNORMAL HIGH (ref 70–99)
Potassium: 4.4 mmol/L (ref 3.5–5.1)
Sodium: 135 mmol/L (ref 135–145)
Total Bilirubin: 0.4 mg/dL (ref 0.0–1.2)
Total Protein: 6.9 g/dL (ref 6.5–8.1)

## 2023-09-14 ENCOUNTER — Encounter: Payer: Self-pay | Admitting: Family Medicine

## 2023-09-14 LAB — VITAMIN D 25 HYDROXY (VIT D DEFICIENCY, FRACTURES): Vit D, 25-Hydroxy: 15.5 ng/mL — ABNORMAL LOW (ref 30.0–100.0)

## 2023-09-14 MED ORDER — OLANZAPINE 2.5 MG TABLET
ORAL_TABLET | Freq: Every evening | ORAL | 3 refills | 0.00 days
Start: 2023-09-14 — End: ?

## 2023-09-14 NOTE — Progress Notes (Signed)
As we spoke on the phone today your vitamin D is low but I do not think given your vitamin D is gena make a difference after speaking with your nephrology team.  You are getting excellent care with your nephrologist.

## 2023-09-17 MED ORDER — OLANZAPINE 2.5 MG TABLET
ORAL_TABLET | Freq: Every evening | ORAL | 3 refills | 90.00 days
Start: 2023-09-17 — End: ?

## 2023-09-19 ENCOUNTER — Telehealth: Payer: Self-pay | Admitting: Psychiatry

## 2023-09-19 NOTE — Telephone Encounter (Signed)
 Pt LVM @ 2:10p asking to find out from Dr Jennelle Human if she is supposed to cut lithium to 150mg  daily or should she alternate taking 150mg  one day and 300 mg the next day.  Next appt 5/14

## 2023-09-19 NOTE — Telephone Encounter (Signed)
 From 1/2 note:  Continue lithium to 2 capsules on even days of the month and 1 capsule on odd days of the month   She asks is this 150 mg capsule. I told her it was and the instructions were on the last Rx sent.

## 2023-09-20 ENCOUNTER — Other Ambulatory Visit: Payer: BC Managed Care – PPO

## 2023-09-25 DIAGNOSIS — N184 Chronic kidney disease, stage 4 (severe): Secondary | ICD-10-CM | POA: Diagnosis not present

## 2023-09-25 DIAGNOSIS — Z79899 Other long term (current) drug therapy: Secondary | ICD-10-CM | POA: Diagnosis not present

## 2023-09-25 DIAGNOSIS — E2749 Other adrenocortical insufficiency: Secondary | ICD-10-CM | POA: Diagnosis not present

## 2023-09-25 DIAGNOSIS — N2581 Secondary hyperparathyroidism of renal origin: Secondary | ICD-10-CM | POA: Diagnosis not present

## 2023-09-25 DIAGNOSIS — I10 Essential (primary) hypertension: Secondary | ICD-10-CM | POA: Diagnosis not present

## 2023-09-25 DIAGNOSIS — I2510 Atherosclerotic heart disease of native coronary artery without angina pectoris: Secondary | ICD-10-CM | POA: Diagnosis not present

## 2023-09-25 DIAGNOSIS — Z7682 Awaiting organ transplant status: Secondary | ICD-10-CM | POA: Diagnosis not present

## 2023-09-25 DIAGNOSIS — N2589 Other disorders resulting from impaired renal tubular function: Secondary | ICD-10-CM | POA: Diagnosis not present

## 2023-09-25 DIAGNOSIS — I129 Hypertensive chronic kidney disease with stage 1 through stage 4 chronic kidney disease, or unspecified chronic kidney disease: Secondary | ICD-10-CM | POA: Diagnosis not present

## 2023-09-25 DIAGNOSIS — D631 Anemia in chronic kidney disease: Secondary | ICD-10-CM | POA: Diagnosis not present

## 2023-09-25 DIAGNOSIS — E875 Hyperkalemia: Secondary | ICD-10-CM | POA: Diagnosis not present

## 2023-09-25 DIAGNOSIS — E785 Hyperlipidemia, unspecified: Secondary | ICD-10-CM | POA: Diagnosis not present

## 2023-09-27 ENCOUNTER — Inpatient Hospital Stay: Payer: BC Managed Care – PPO

## 2023-10-04 ENCOUNTER — Other Ambulatory Visit: Payer: BC Managed Care – PPO

## 2023-10-04 ENCOUNTER — Inpatient Hospital Stay: Attending: Oncology

## 2023-10-04 DIAGNOSIS — C9110 Chronic lymphocytic leukemia of B-cell type not having achieved remission: Secondary | ICD-10-CM | POA: Insufficient documentation

## 2023-10-04 DIAGNOSIS — D619 Aplastic anemia, unspecified: Principal | ICD-10-CM

## 2023-10-04 LAB — CMP (CANCER CENTER ONLY)
ALT: 68 U/L — ABNORMAL HIGH (ref 0–44)
AST: 44 U/L — ABNORMAL HIGH (ref 15–41)
Albumin: 4.4 g/dL (ref 3.5–5.0)
Alkaline Phosphatase: 59 U/L (ref 38–126)
Anion gap: 8 (ref 5–15)
BUN: 48 mg/dL — ABNORMAL HIGH (ref 8–23)
CO2: 21 mmol/L — ABNORMAL LOW (ref 22–32)
Calcium: 9.5 mg/dL (ref 8.9–10.3)
Chloride: 111 mmol/L (ref 98–111)
Creatinine: 3 mg/dL — ABNORMAL HIGH (ref 0.44–1.00)
GFR, Estimated: 17 mL/min — ABNORMAL LOW (ref >60)
Glucose, Bld: 98 mg/dL (ref 70–99)
Potassium: 4.5 mmol/L (ref 3.5–5.1)
Sodium: 140 mmol/L (ref 135–145)
Total Bilirubin: 0.4 mg/dL (ref 0.0–1.2)
Total Protein: 6.7 g/dL (ref 6.5–8.1)

## 2023-10-04 LAB — CBC WITH DIFFERENTIAL (CANCER CENTER ONLY)
Abs Immature Granulocytes: 0.01 10*3/uL (ref 0.00–0.07)
Basophils Absolute: 0 10*3/uL (ref 0.0–0.1)
Basophils Relative: 0 %
Eosinophils Absolute: 0.2 10*3/uL (ref 0.0–0.5)
Eosinophils Relative: 4 %
HCT: 33.5 % — ABNORMAL LOW (ref 36.0–46.0)
Hemoglobin: 10.3 g/dL — ABNORMAL LOW (ref 12.0–15.0)
Immature Granulocytes: 0 %
Lymphocytes Relative: 43 %
Lymphs Abs: 1.7 10*3/uL (ref 0.7–4.0)
MCH: 30.9 pg (ref 26.0–34.0)
MCHC: 30.7 g/dL (ref 30.0–36.0)
MCV: 100.6 fL — ABNORMAL HIGH (ref 80.0–100.0)
Monocytes Absolute: 0.3 10*3/uL (ref 0.1–1.0)
Monocytes Relative: 6 %
Neutro Abs: 1.8 10*3/uL (ref 1.7–7.7)
Neutrophils Relative %: 47 %
Platelet Count: 128 10*3/uL — ABNORMAL LOW (ref 150–400)
RBC: 3.33 MIL/uL — ABNORMAL LOW (ref 3.87–5.11)
RDW: 13.4 % (ref 11.5–15.5)
WBC Count: 4 10*3/uL (ref 4.0–10.5)
nRBC: 0 % (ref 0.0–0.2)

## 2023-10-09 DIAGNOSIS — Z9484 Stem cells transplant status: Principal | ICD-10-CM

## 2023-10-09 MED ORDER — LEVOTHYROXINE 100 MCG TABLET
ORAL_TABLET | Freq: Every day | ORAL | 1 refills | 90.00 days | Status: CP
Start: 2023-10-09 — End: ?

## 2023-10-11 ENCOUNTER — Other Ambulatory Visit: Payer: BC Managed Care – PPO

## 2023-10-11 DIAGNOSIS — D619 Aplastic anemia, unspecified: Principal | ICD-10-CM

## 2023-10-11 DIAGNOSIS — C9110 Chronic lymphocytic leukemia of B-cell type not having achieved remission: Principal | ICD-10-CM

## 2023-10-15 DIAGNOSIS — C9110 Chronic lymphocytic leukemia of B-cell type not having achieved remission: Principal | ICD-10-CM

## 2023-10-15 DIAGNOSIS — D619 Aplastic anemia, unspecified: Principal | ICD-10-CM

## 2023-10-16 ENCOUNTER — Other Ambulatory Visit: Admit: 2023-10-16 | Discharge: 2023-10-17 | Payer: BLUE CROSS/BLUE SHIELD

## 2023-10-16 ENCOUNTER — Ambulatory Visit: Admit: 2023-10-16 | Discharge: 2023-10-17 | Payer: BLUE CROSS/BLUE SHIELD

## 2023-10-16 DIAGNOSIS — C9110 Chronic lymphocytic leukemia of B-cell type not having achieved remission: Secondary | ICD-10-CM | POA: Diagnosis not present

## 2023-10-16 DIAGNOSIS — Z9484 Stem cells transplant status: Secondary | ICD-10-CM | POA: Diagnosis not present

## 2023-10-16 DIAGNOSIS — Z9481 Bone marrow transplant status: Secondary | ICD-10-CM | POA: Diagnosis not present

## 2023-10-16 DIAGNOSIS — Z5181 Encounter for therapeutic drug level monitoring: Secondary | ICD-10-CM | POA: Diagnosis not present

## 2023-10-16 DIAGNOSIS — D849 Immunodeficiency, unspecified: Secondary | ICD-10-CM | POA: Diagnosis not present

## 2023-10-18 ENCOUNTER — Other Ambulatory Visit: Payer: BC Managed Care – PPO

## 2023-10-22 ENCOUNTER — Other Ambulatory Visit: Payer: Self-pay | Admitting: Psychiatry

## 2023-10-22 DIAGNOSIS — F331 Major depressive disorder, recurrent, moderate: Secondary | ICD-10-CM

## 2023-10-23 DIAGNOSIS — R300 Dysuria: Secondary | ICD-10-CM | POA: Diagnosis not present

## 2023-10-23 DIAGNOSIS — C9110 Chronic lymphocytic leukemia of B-cell type not having achieved remission: Principal | ICD-10-CM

## 2023-10-25 ENCOUNTER — Other Ambulatory Visit: Payer: BC Managed Care – PPO

## 2023-10-31 DIAGNOSIS — R8290 Unspecified abnormal findings in urine: Secondary | ICD-10-CM | POA: Diagnosis not present

## 2023-11-01 ENCOUNTER — Other Ambulatory Visit: Payer: BC Managed Care – PPO

## 2023-11-08 ENCOUNTER — Other Ambulatory Visit: Payer: BC Managed Care – PPO

## 2023-11-15 ENCOUNTER — Other Ambulatory Visit: Payer: BC Managed Care – PPO

## 2023-11-22 ENCOUNTER — Other Ambulatory Visit: Payer: BC Managed Care – PPO

## 2023-11-29 ENCOUNTER — Other Ambulatory Visit: Payer: BC Managed Care – PPO

## 2023-12-05 ENCOUNTER — Encounter: Payer: Self-pay | Admitting: Psychiatry

## 2023-12-05 ENCOUNTER — Ambulatory Visit: Payer: BC Managed Care – PPO | Admitting: Psychiatry

## 2023-12-05 DIAGNOSIS — F4310 Post-traumatic stress disorder, unspecified: Secondary | ICD-10-CM

## 2023-12-05 DIAGNOSIS — F4001 Agoraphobia with panic disorder: Secondary | ICD-10-CM

## 2023-12-05 DIAGNOSIS — F331 Major depressive disorder, recurrent, moderate: Secondary | ICD-10-CM

## 2023-12-05 DIAGNOSIS — Z79899 Other long term (current) drug therapy: Secondary | ICD-10-CM

## 2023-12-05 DIAGNOSIS — F5105 Insomnia due to other mental disorder: Secondary | ICD-10-CM

## 2023-12-05 DIAGNOSIS — F338 Other recurrent depressive disorders: Secondary | ICD-10-CM

## 2023-12-05 MED ORDER — LITHIUM CARBONATE 150 MG PO CAPS: 150.0000 mg | ORAL_CAPSULE | Freq: Every day | ORAL | 1 refills | Status: DC

## 2023-12-05 MED ORDER — ALPRAZOLAM 0.25 MG PO TABS: ORAL_TABLET | ORAL | 2 refills | Status: DC

## 2023-12-05 MED ORDER — AUVELITY 45-105 MG PO TBCR
EXTENDED_RELEASE_TABLET | ORAL | 1 refills | Status: DC
Start: 2023-12-05 — End: 2024-05-28

## 2023-12-05 NOTE — Progress Notes (Signed)
 Meagan Walsh [id number] 08-18-1960 63 y.o.    Subjective:   Patient ID:  Meagan Walsh is a 63 y.o. (DOB [birth year]) female.  Chief Complaint:  Chief Complaint  Patient presents with   Follow-up   Depression   Medication Reaction   Sleeping Problem    Meagan Walsh presents to the office today for follow-up of bipolar depression.  seen in January 2020.  Mood was stable and no meds were changed.   seen May 2020.  Lithium  level was 0.68.  Mood was stable and no meds were changed.  Discussed her care with her oncologist Meagan Walsh in June 2020.  She was dealing with pancytopenia and it was requested that she wean off lamotrigine  to see if it was contributing.  That was done.  August 11, 2019 another conversation with Meagan Walsh.  She had elevated lithium  of 2.1 on August 08, 2019.  She had lost some weight and was having some nausea and vomiting.  Lithium  was held for a few days then restarted at 300 mg daily.  Lithium  levels have been checked frequently since that time.  seen September 24, 2019.  She was not depressed but struggling with nausea and poor appetite and the following changes were made to see if it would help. In view of chronic nausea and weight loss despite med changes consider Trintellix .  Trintellix  does cause nausea about 10% of people. It was tolerated in the past. Reduce Trintellix  to 10 mg daily. Add mirtazapine  30 mg tablet at night which could help appetite and potentially replace Trintellix . If nausea is not resolved at follow up then stop Trintellix .  Call if depression worsens.  10/29/2019 appt noted: Did fine with med changes.  No problems with reduction in Trintellix .  Gained 11# since here.  At a good weight.  Wonders about stopping it for awhile. Good appetite.  No nausea now.  Still struggling with  CLL.  Needs bone marrow transplant but hasn't been able to occur yet and will be done in [place] with her living there for 3 mos.  Tired of being  in limbo.  Rare Xanax  used as nausea med.  Using Ambien  at HS.  Sleep alright.   In spite of stress has not been depressed.  Patient reports stable mood and denies depressed or irritable moods.  Patient denies any recent difficulty with anxiety.  Patient denies difficulty with sleep initiation or maintenance with Ambien . Denies appetite disturbance.  No weight loss.  Patient reports that energy and motivation have been good.  Patient denies any difficulty with concentration.  Patient denies any suicidal ideation. Plan: Continue Trintellix  to 10 mg daily. Continue or stop mirtazapine  30 mg tablet at night per her request. Weight loss resolved.  10/22/2020 appt noted: 2nd bone marrow transplant in [place] in Jan. Will be there at least a couple of more months. Pending lithium  level from today. Lost sig weight this year about 108#.  Feeding tube. Restarted mirtazapine  30 HS about a week ago. Has had a lot of nausea.   Takes alprazolam  this week DT panicky feelings like walls caving in.  Worry over wasting away.  Seemed to help her.  Last day or 2 only needed one.  Don't think I've been depressed, certainly not like in the past.  More like anxiety feeling trapped.  In general feels ok. Still on trintellix  10 HS, lithium  300 mg HS, Ambien  5-10 mg HS. Sleep is pretty good.  Now labs 3 times  per week and adjusting antirejection med.   Tolerating psych meds as far as known bc no history of SE with psych meds.  03/25/2021 TC with oncology: Return telephone call to oncologist at [facility] Meagan Walsh. Reports that the patient is not eating well and has required a G-tube as well as a central line.  Meagan Walsh believes the patient has developed anorexia nervosa and is restricting not only calories but also fluids.  There is no other reasons for the gastric tube or further central line other than maintaining hydration and nutrition because she will not take in enough orally. The patient is no longer  on mirtazapine  but is still on Trintellix .  Suggested the oncologist restart the mirtazapine  30 mg nightly because it helped the patient gain weight before.  We will get her in to the office as soon as possible.  If in fact she does have symptoms of any her anorexia nervosa then she will need more intensive therapy.  Meagan Walsh believes she may need an inpatient anorexia nervosa program.  04/01/2021 appt noted: Covid 03/04/21.   She's aware oncology is concerned she might have anorexia.  Hard time gaining weight.  Full so fast and when try to push it.  When in hospital went so long without eating.  Has feeding tube and high calorie food gives her diarrhea. Doesn't remember why she stopped mirtazapine . "I'm too thin" and hate the way I look.  Doesn't want to lose more bc of health danger.  No thirst and relying on Pick line for hydration getting IV fluids daily.   No history of EDO when younger. Not depressed.  Not anxious unusuallly.  It is better now.  Sleep is OK and about 9 hours nightly.  Still takes Ambien  to go to sleep.  04/15/21 appt noted: Mirtazapine  helped appetite and eaten more.  Diarrhea 2 days and have been off.  Might have gained a little.  A little better with fluids but not great.  Pick line removed.  Put fluids in GI tube.   No problems with mirtazapine . Sometimes has diarrhea but not usually. Weigh herself.   08/21/2022 urgent appointment noted: Has not been seen in some time and had weaned off all psych meds.  Has become more urgently depressed. No psych meds except trazodone for sleep.  No other psych meds for a year.. Regained wt lost in chemo.  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  Still low WBC. Called since early January more blue and not getting out of house.  Got Covid 2 weeks ago and recovered.  No post-Covid fatigue. Stress son in crisis in [place] and needs to fly out to help.  Fears going back into depression and got scared.  Calling a therpist soon.   Too much free time.  No SI.  Thinks it's Ok to resume lithium  again as she stopped while in more intensive chemo.  Those medical concerns are no longer a problem.s   Sometimes THC gummy to help her feel calmer and sleep better prn.  2 times a month has taken alprazolam .   Appetite is oK Plan: Resume lithium  300 mg HS  10/11/22 appt noted: I feel really good.  Dr. Fletcher Humble bone marrow doctor thinks benefit due to olanzapine  2.5 mg restarted early Feb after stopping it in Nov.  Was taking as an antinausea med. My lifestyle more conducive to not being depressed.   Now more active and got depressed after Covid. Meds: lithium   300 mg HS, olanzapine  2.5 mg HS, trazodone 100 mg HS Patient reports stable mood and denies depressed or irritable moods.  Patient denies any recent difficulty with anxiety.  Patient denies difficulty with sleep initiation or maintenance. Denies appetite disturbance.  Patient reports that energy and motivation have been good.  Patient denies any difficulty with concentration.  Patient denies any suicidal ideation. Plan: Hold lithium  300 mg HS DT kidney function, Cr 2.4 on 10/01/22.  The elevation is not new. Continue olanzapine  2.5 mg HS for mood Continue trazodone 100 HS Alprazolam  prn   12/20/22 appt noted: Psych med: olanzapine  2.5 mg taking at 8 PM, trazodone 50-100 mg HS prn.  No other antidepressant. Delta 9 gummies. No lithium  for a couple of mos. Still trouble falling asleep.  Goes to bed 1030.   Not dep.  But not enough going on right now.   Misses kids in [place].  Able to travel now.  Helps. Still on some anti-rejection meds.   Stage 4 kidney dz.  Will be on transplant list. Plan: no changes  02/14/23 TC resumed lithium  150 mg daily for 2 weeks without benefit.  Wonders about increasing the dose.   Urgent appt sched instead Feels scared of going into deep dep again.  Getting worse. Started after flu and had to stay in the house a couple of weeks and then the dep began  coming back.  Started 19 June.   For a couple of mos isolated more and bored.  Not busy enough.  Somewhat down over kids in [place] and misses them.  02/16/23 urgent appt noted: Feels scared of going into deep dep again.  Getting worse. Started after flu and had to stay in the house a couple of weeks and then the dep began coming back.  Started 19 June.   For a couple of mos isolated more and bored.  Not busy enough.  Somewhat down over kids in [place] and misses them. Anxiety ridden terrified depression.   Not eating great since dep.  Not sleeping well either.  Lie there and ruminate.   Plan: Start Auvelity  for depression, 1 in the AM for 1 week then 1 twice daily. Increase lithium  to 300 mg or 2 of the 150 mg capsules. This worked before.  03/09/23  appt noted: Started feeling better after 3 days and gradually better thereafter.  90% better with depression .  Feels a little spacey and H notices. Some weakness.  Went so long without exercising. Not worse she thinks.   No dizziness.  No SE.  Minimal nausea except at night and history of this.  No other concerns.   Plan: Continue olanzapine  2.5 mg HS for mood Continue trazodone 100 HS Alprazolam  prn HS.   continue Auvelity  for depression 1 twice daily. Reduce lithium  to 2 capsules on even days of the month and 1 capsule on odd days of the month.  Matthew Songster, CMA  to Me    03/15/23  9:47 AM Note Patient reports severe constipation. She was advised by her GI provider to take Miralax. Said she took 2 doses for 2 days and then was told to take every hour yesterday. She has had no results thus far. She called GI again and she reports that Auvelity  could be the cause. Asking for guidance.     Me  to Matthew Songster, CMA  CC   03/15/23  5:53 PM Note Auvelity  has a 4% risk constipation and 6% risk diarrhea. Stop Auvelity   until the constipation resolves.  Take Miralax daily.  Then resume 1 Auvelity  in the AM     04/25/23 appt noted: Psych  meds: Auvelity  1 every morning, lithium  150 mg capsule 2 alternating with 1 every other day, olanzapine  2.5 nightly. Doing well .  Not dep.  Tolerating meds. Constipation resolved so far. Went to [place] and [place] visiting son. H doing well.  Going to wedding in [place].  He's still working.   Sleep and appetite are good.  Jonda Neighbours., MD at 05/24/2023 11:59 PM  Status: Signed  Lithium  level 1.0 on 150/300 mg every other day. Consider reduction to 150 mg daily. BMP stable. Consider reduction afte rthe holidays. Nori Beat, MD, DFAPA     07/26/23 appt noted: Psych meds: Auvelity  1 every morning, lithium  150 mg capsule 2 alternating with 1 every other day, stopped olanzapine  2.5 nightly, trazodone 50 mg HS.Aaron Aas Asks for alprazolam  prn RX.   Patient reports stable mood and denies depressed or irritable moods.  Patient denies any recent difficulty with anxiety.  Patient denies difficulty with sleep initiation or maintenance usually. Denies appetite disturbance.  Patient reports that energy and motivation have been good.  Patient denies any difficulty with concentration.  Patient denies any suicidal ideation. Plan: Continue olanzapine  2.5 mg HS for mood Continue trazodone 100 HS Alprazolam  prn HS.   continue Auvelity  for depression 1 daily. Continue lithium  150mg   2 capsules on even days of the month and 1 capsule on odd days of the month Until after winter:  then in march reduce to 225/150 mg QOD Check level every 3 mos.   12/05/23 appt noted: Med: Auvelity  1 every morning, lithium  150 mg capsule 2 alternating with 1 every other day, stopped olanzapine  2.5 nightly, trazodone 50 mg HS, , alprazolam  0.25 mg prn about weekly. Didn't get dep this winter.   Good overall staying busy.   No SE with meds.   Sleep ok with trazodone. Not usually anxious.   Appetite and wt stable.  Checked [facility] and [place].  2 severe episodes depression in life that last a full year.  Multiple  failed prior medication trials include but are not limited to the following:   duloxetine, Viibryd,  Trintellix  20 mg failed until lithium  was added Rexulti 0.5 mg caused restlessness, Abilify, Vraylar, Mirtazapine  30 helped appetite. 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  Gastrointestinal:  Negative for abdominal pain, diarrhea, nausea and vomiting.  Neurological:  Negative for dizziness and tremors.  Psychiatric/Behavioral:  Negative for agitation, behavioral problems, confusion, decreased concentration, dysphoric mood, hallucinations, self-injury, sleep disturbance and suicidal ideas. The patient is not nervous/anxious and is not hyperactive.     Medications: I have reviewed the patient's current medications.  Current Outpatient Medications  Medication Sig Dispense Refill   amLODipine  (NORVASC ) 2.5 MG tablet Take 1 tablet by mouth daily.     calcitRIOL (ROCALTROL) 0.25 MCG capsule 1 capsule Orally Daily for 90 days     levothyroxine  (SYNTHROID ) 100 MCG tablet TAKE 1 TABLET BY MOUTH EVERY DAY 30 tablet 2   LOKELMA 10 g PACK packet Take by mouth.     tacrolimus  (PROGRAF ) 0.5 MG capsule Take 1.5 mg by mouth in the morning and at bedtime.     traZODone (DESYREL) 50 MG tablet Take 50  mg by mouth at bedtime. 2 TABS hs     ALPRAZolam  (XANAX ) 0.25 MG tablet TAKE 1 TO 2 TABLETS BY MOUTH EVERY DAY AS NEEDED FOR ANXIETY 60 tablet 2   Dextromethorphan-buPROPion ER (AUVELITY ) 45-105 MG TBCR 1 tablet daily 90 tablet 1   lithium  carbonate 150 MG capsule Take 1 capsule (150 mg total) by mouth daily at 12 noon. 90 capsule 1   No current facility-administered medications for this visit.    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   Hypothyroidism    Lymphocytosis    Mitral valve prolapse    Mitral valve prolapse    Thyroid  disease    Hypothyroid    Family History  Problem Relation Walsh of Onset   Breast cancer Mother    Prostate cancer Father    Colon cancer Neg Hx    Esophageal cancer Neg Hx    Pancreatic cancer Neg Hx    Stomach cancer Neg Hx     Social History   Socioeconomic History   Marital status: Married    Spouse name: Not on file   Number of children: Not on file   Years of education: Not on file   Highest education level: Not on file  Occupational History   Not on file  Tobacco Use   Smoking status: Never   Smokeless tobacco: Never  Vaping Use   Vaping status: Not on file  Substance and Sexual Activity   Alcohol use: Not Currently    Comment: 2 glasses of wine 2 times per week    Drug use: No   Sexual activity: Not on file  Other Topics Concern   Not on file  Social History Narrative   Pt lives in 2 story home with her husband   Has 3 adult children   Masters degree   Works at [employer]   Social Drivers of Corporate investment banker Strain: Low Risk  (09/25/2020)   [facility]   Overall Financial Resource Strain (CARDIA)    Difficulty of Paying Living Expenses: Not very hard  Food Insecurity: No Food Insecurity (09/25/2023)   [facility] from [facility]   Hunger Vital Sign    Worried About Running Out of Food in the Last Year: Never true    Ran Out of Food in the Last Year: Never true  Transportation Needs: No Transportation Needs (09/25/2023)   Received from [facility] - Transportation    In the past 12 months, has lack of transportation kept you from medical appointments or from getting medications?: No    Lack of Transportation  (Non-Medical): No  Physical Activity: Not on file  Stress: Not on file  Social Connections: Not on file  Intimate Partner Violence: Not on file    Past Medical History, Surgical history, Social history, and Family history were reviewed and updated as appropriate.   Please see review of systems for further details on the patient's review from today.   Objective:   Physical Exam:  There were no vitals taken for this visit.  Physical Exam Constitutional:  General: She is not in acute distress.    Appearance: She is well-developed.  Musculoskeletal:        General: No deformity.  Neurological:     Mental Status: She is alert and oriented to person, place, and time.     Motor: No tremor.     Coordination: Coordination normal.     Gait: Gait normal.  Psychiatric:        Attention and Perception: Attention and perception normal.        Mood and Affect: Mood is not anxious or depressed. Affect is not tearful or inappropriate.        Speech: Speech normal. Speech is not slurred.        Behavior: Behavior normal.        Thought Content: Thought content normal. Thought content is not delusional. Thought content does not include homicidal or suicidal ideation. Thought content does not include suicidal plan.        Cognition and Memory: Cognition normal.        Judgment: Judgment normal.     Comments: Insight intact. No auditory or visual hallucinations. No delusions.  No anxiety and dep resolved     Lab Review:     Component Value Date/Time   NA 140 10/04/2023 1056   K 4.5 10/04/2023 1056   CL 111 10/04/2023 1056   CO2 21 (L) 10/04/2023 1056   GLUCOSE 98 10/04/2023 1056   BUN 48 (H) 10/04/2023 1056   CREATININE 3.00 (H) 10/04/2023 1056   CREATININE 2.63 (H) 05/24/2023 1429   CALCIUM 9.5 10/04/2023 1056   PROT 6.7 10/04/2023 1056   ALBUMIN 4.4 10/04/2023 1056   AST 44 (H) 10/04/2023 1056   ALT 68 (H) 10/04/2023 1056   ALKPHOS 59 10/04/2023 1056   BILITOT 0.4  10/04/2023 1056   GFRNONAA 17 (L) 10/04/2023 1056   GFRAA >60 12/17/2019 0830       Component Value Date/Time   WBC 4.0 10/04/2023 1056   WBC 9.9 03/31/2019 0845   RBC 3.33 (L) 10/04/2023 1056   HGB 10.3 (L) 10/04/2023 1056   HGB 14.0 06/27/2016 1038   HCT 33.5 (L) 10/04/2023 1056   HCT 42.0 06/27/2016 1038   PLT 128 (L) 10/04/2023 1056   PLT 205 06/27/2016 1038   MCV 100.6 (H) 10/04/2023 1056   MCV 92.9 06/27/2016 1038   MCH 30.9 10/04/2023 1056   MCHC 30.7 10/04/2023 1056   RDW 13.4 10/04/2023 1056   RDW 12.2 06/27/2016 1038   LYMPHSABS 1.7 10/04/2023 1056   LYMPHSABS 21.5 (H) 06/27/2016 1038   MONOABS 0.3 10/04/2023 1056   MONOABS 0.5 06/27/2016 1038   EOSABS 0.2 10/04/2023 1056   EOSABS 0.2 06/27/2016 1038   BASOSABS 0.0 10/04/2023 1056   BASOSABS 0.1 06/27/2016 1038    Lithium  Lvl  Date Value Ref Range Status  05/24/2023 1.0 0.6 - 1.2 mmol/L Final  05/24/23 Lithium  level 1.0 on 150/300 mg every other day.  02/21/23 lithium  0.8 on 300 mg daily   Component 08/24/20 08/14/20 04/02/20 01/07/20  Lithium  Lvl 0.4 0.6 0.8 0.6   on 300 mg daily  Component 12/19/22 11/14/22 10/10/22 09/06/22 08/08/22 07/14/22  Sodium 143 142 141 141 141 145  Potassium 4.2 4.3 4.0 4.2 5.2 High  4.8  Chloride 113 High  114 High  113 High  113 High  113 High  117 High   CO2 25.0 20.0 24.0 25.0 24.0 22.0  Anion Gap 5 8 4  Low  3 Low  4 Low  6  BUN 46 High  58 High  46 High  50 High  51 High  48 High   Creatinine 2.09 High  2.54 High  2.43 High  2.65 High  2.90 High  2.73 High   BUN/Creatinine Ratio 22 23 19 19 18 18   eGFR CKD-EPI (2021) Female 26 Low   21 Low   22 Low   20 Low   18 Low   19 Low      No results found for: "PHENYTOIN", "PHENOBARB", "VALPROATE", "CBMZ"   .res Assessment: Plan:    Major depressive disorder, recurrent episode, moderate (HCC) - Plan: lithium  carbonate 150 MG capsule, Dextromethorphan-buPROPion ER (AUVELITY ) 45-105 MG TBCR, Lithium  level  Panic disorder  with agoraphobia - Plan: ALPRAZolam  (XANAX ) 0.25 MG tablet  PTSD (post-traumatic stress disorder) - Plan: ALPRAZolam  (XANAX ) 0.25 MG tablet  Insomnia due to mental condition - Plan: ALPRAZolam  (XANAX ) 0.25 MG tablet  Lithium  use  Seasonal affective disorder (HCC)   30 min face to face time with patient was spent on counseling and oordination of care. We discussed Taliana has had history of 2 very severe prolonged depressions that failed to respond to multiple psychiatric medications but then responded to lithium  potentiation.  She has been through recent winters with no difficulty.  She has a very strong seasonal pattern.  She previously failed to respond to several antidepressants including her last antidepressant which was Trintellix  20 mg until lithium  300 mg daily was added and she went into remission. She remained in remission through treatment for cancer during which time she had to stop all her psychiatric medications.  She has remained in remission until this January 2024 .Aaron Aas  Her cancer has remained in remission.  Her depression resolved again when here in January 2024 since resuming lithium  300 mg nightly.  However her cancer doctor also resumed olanzapine  2.5 mg nightly at about the same time and there is a question as to which med change helped her.  Because of her high creatinine level we stopped the lithium  in the hopes that the olanzapine  was what actually improved her mood.   However dep relapse after flu June 2024.  Has resolved again with Auvelity  and increase in lithium  to 300 mg daily with level 0.8 02/21/23. August 2024 we reduced lithium  to 150/300 every other day  with no relapse. The use of lithium  is more risky given the elevated creatinine.  Disc again SS lithium  toxicity and this risk greater given kidney status.  Will try to reduce again to protect kidneys if possible.  Lithium  0.8 on 02/21/23  on 300 mg HS. 05/24/23 Lithium  level 1.0 on 150/300 mg every other day.    Cr 2.4 on 10/01/22.  Cr 3.0 on 10/04/23 . The elevation is not new.  Continue trazodone 100 HS Alprazolam  prn HS.   continue Auvelity  for depression 1 daily.  Has worked so far.  As planned reduce lithium  150 mg once daily DT kidneys.  Disc risk relapse of dep and call. Check level.  Call if recurrence of depression  FU 3-4  mos  Nori Beat, MD, DFAPA   Please see After Visit Summary for patient specific instructions.  Future Appointments  Date Time Provider Department Center  03/26/2024  1:30 PM Cottle, Kennedy Peabody., MD [facility] None       Orders Placed This Encounter  Procedures   Lithium  level      -------------------------------

## 2023-12-06 ENCOUNTER — Other Ambulatory Visit: Payer: BC Managed Care – PPO

## 2023-12-12 ENCOUNTER — Ambulatory Visit: Admit: 2023-12-12 | Discharge: 2023-12-13 | Payer: BLUE CROSS/BLUE SHIELD

## 2023-12-12 ENCOUNTER — Other Ambulatory Visit: Admit: 2023-12-12 | Discharge: 2023-12-13 | Payer: BLUE CROSS/BLUE SHIELD

## 2023-12-12 DIAGNOSIS — D84822 Immunodeficiency due to external causes: Secondary | ICD-10-CM | POA: Diagnosis not present

## 2023-12-12 DIAGNOSIS — Z5181 Encounter for therapeutic drug level monitoring: Secondary | ICD-10-CM | POA: Diagnosis not present

## 2023-12-12 DIAGNOSIS — Z9484 Stem cells transplant status: Secondary | ICD-10-CM | POA: Diagnosis not present

## 2023-12-12 DIAGNOSIS — C9110 Chronic lymphocytic leukemia of B-cell type not having achieved remission: Secondary | ICD-10-CM | POA: Diagnosis not present

## 2023-12-24 DIAGNOSIS — Z7682 Awaiting organ transplant status: Secondary | ICD-10-CM | POA: Diagnosis not present

## 2023-12-25 DIAGNOSIS — C9110 Chronic lymphocytic leukemia of B-cell type not having achieved remission: Principal | ICD-10-CM

## 2023-12-26 DIAGNOSIS — C9110 Chronic lymphocytic leukemia of B-cell type not having achieved remission: Principal | ICD-10-CM

## 2024-01-01 DIAGNOSIS — N184 Chronic kidney disease, stage 4 (severe): Secondary | ICD-10-CM | POA: Diagnosis not present

## 2024-01-01 DIAGNOSIS — N2581 Secondary hyperparathyroidism of renal origin: Secondary | ICD-10-CM | POA: Diagnosis not present

## 2024-01-01 DIAGNOSIS — Z7682 Awaiting organ transplant status: Secondary | ICD-10-CM | POA: Diagnosis not present

## 2024-01-01 DIAGNOSIS — I10 Essential (primary) hypertension: Secondary | ICD-10-CM | POA: Diagnosis not present

## 2024-01-02 DIAGNOSIS — N184 Chronic kidney disease, stage 4 (severe): Secondary | ICD-10-CM | POA: Diagnosis not present

## 2024-01-02 DIAGNOSIS — C9110 Chronic lymphocytic leukemia of B-cell type not having achieved remission: Secondary | ICD-10-CM | POA: Diagnosis not present

## 2024-01-02 DIAGNOSIS — R030 Elevated blood-pressure reading, without diagnosis of hypertension: Secondary | ICD-10-CM | POA: Diagnosis not present

## 2024-01-02 DIAGNOSIS — Z0000 Encounter for general adult medical examination without abnormal findings: Secondary | ICD-10-CM | POA: Diagnosis not present

## 2024-01-07 ENCOUNTER — Other Ambulatory Visit: Payer: Self-pay | Admitting: Obstetrics & Gynecology

## 2024-01-07 DIAGNOSIS — Z1231 Encounter for screening mammogram for malignant neoplasm of breast: Secondary | ICD-10-CM

## 2024-01-11 DIAGNOSIS — R051 Acute cough: Secondary | ICD-10-CM | POA: Diagnosis not present

## 2024-01-16 ENCOUNTER — Ambulatory Visit
Admission: RE | Admit: 2024-01-16 | Discharge: 2024-01-16 | Disposition: A | Discharge status: Home / Self Care | Source: Ambulatory Visit | Attending: Obstetrics & Gynecology | Admitting: Obstetrics & Gynecology

## 2024-01-16 DIAGNOSIS — D225 Melanocytic nevi of trunk: Secondary | ICD-10-CM | POA: Diagnosis not present

## 2024-01-16 DIAGNOSIS — L821 Other seborrheic keratosis: Secondary | ICD-10-CM | POA: Diagnosis not present

## 2024-01-16 DIAGNOSIS — Z1231 Encounter for screening mammogram for malignant neoplasm of breast: Secondary | ICD-10-CM

## 2024-01-16 DIAGNOSIS — D2272 Melanocytic nevi of left lower limb, including hip: Secondary | ICD-10-CM | POA: Diagnosis not present

## 2024-01-16 DIAGNOSIS — L814 Other melanin hyperpigmentation: Secondary | ICD-10-CM | POA: Diagnosis not present

## 2024-02-05 ENCOUNTER — Telehealth: Payer: Self-pay | Admitting: *Deleted

## 2024-02-05 DIAGNOSIS — C9110 Chronic lymphocytic leukemia of B-cell type not having achieved remission: Secondary | ICD-10-CM

## 2024-02-05 NOTE — Telephone Encounter (Signed)
 Meagan Walsh called to report [facility] wants her to have monthly CMP7, CBC and renal profile panel x 6 months.

## 2024-02-11 ENCOUNTER — Inpatient Hospital Stay: Attending: Nurse Practitioner

## 2024-02-11 DIAGNOSIS — C9110 Chronic lymphocytic leukemia of B-cell type not having achieved remission: Secondary | ICD-10-CM | POA: Diagnosis not present

## 2024-02-11 LAB — CBC WITH DIFFERENTIAL (CANCER CENTER ONLY)
Abs Immature Granulocytes: 0.01 K/uL (ref 0.00–0.07)
Basophils Absolute: 0 K/uL (ref 0.0–0.1)
Basophils Relative: 1 %
Eosinophils Absolute: 0.2 K/uL (ref 0.0–0.5)
Eosinophils Relative: 6 %
HCT: 30 % — ABNORMAL LOW (ref 36.0–46.0)
Hemoglobin: 9.4 g/dL — ABNORMAL LOW (ref 12.0–15.0)
Immature Granulocytes: 0 %
Lymphocytes Relative: 46 %
Lymphs Abs: 1.5 K/uL (ref 0.7–4.0)
MCH: 31.8 pg (ref 26.0–34.0)
MCHC: 31.3 g/dL (ref 30.0–36.0)
MCV: 101.4 fL — ABNORMAL HIGH (ref 80.0–100.0)
Monocytes Absolute: 0.2 K/uL (ref 0.1–1.0)
Monocytes Relative: 7 %
Neutro Abs: 1.3 K/uL — ABNORMAL LOW (ref 1.7–7.7)
Neutrophils Relative %: 40 %
Platelet Count: 112 K/uL — ABNORMAL LOW (ref 150–400)
RBC: 2.96 MIL/uL — ABNORMAL LOW (ref 3.87–5.11)
RDW: 12.8 % (ref 11.5–15.5)
WBC Count: 3.2 K/uL — ABNORMAL LOW (ref 4.0–10.5)
nRBC: 0 % (ref 0.0–0.2)

## 2024-02-11 LAB — BASIC METABOLIC PANEL - CANCER CENTER ONLY
Anion gap: 12 (ref 5–15)
BUN: 74 mg/dL — ABNORMAL HIGH (ref 8–23)
CO2: 17 mmol/L — ABNORMAL LOW (ref 22–32)
Calcium: 9.1 mg/dL (ref 8.9–10.3)
Chloride: 108 mmol/L (ref 98–111)
Creatinine: 3.2 mg/dL — ABNORMAL HIGH (ref 0.44–1.00)
GFR, Estimated: 16 mL/min — ABNORMAL LOW
Glucose, Bld: 121 mg/dL — ABNORMAL HIGH (ref 70–99)
Potassium: 5 mmol/L (ref 3.5–5.1)
Sodium: 136 mmol/L (ref 135–145)

## 2024-02-11 LAB — RENAL FUNCTION PANEL
Albumin: 4.1 g/dL (ref 3.5–5.0)
Anion gap: 12 (ref 5–15)
BUN: 73 mg/dL — ABNORMAL HIGH (ref 8–23)
CO2: 17 mmol/L — ABNORMAL LOW (ref 22–32)
Calcium: 9 mg/dL (ref 8.9–10.3)
Chloride: 108 mmol/L (ref 98–111)
Creatinine, Ser: 3.18 mg/dL — ABNORMAL HIGH (ref 0.44–1.00)
GFR, Estimated: 16 mL/min — ABNORMAL LOW (ref >60)
Glucose, Bld: 119 mg/dL — ABNORMAL HIGH (ref 70–99)
Phosphorus: 4.3 mg/dL (ref 2.5–4.6)
Potassium: 4.9 mmol/L (ref 3.5–5.1)
Sodium: 136 mmol/L (ref 135–145)

## 2024-02-20 ENCOUNTER — Telehealth: Payer: Self-pay

## 2024-02-20 ENCOUNTER — Telehealth: Payer: Self-pay | Admitting: Psychiatry

## 2024-02-20 NOTE — Telephone Encounter (Signed)
 Medication is Auvelity  and we are aware of approval.

## 2024-02-20 NOTE — Telephone Encounter (Signed)
 Gina at [facility] LVM @ 1:37p that a request has been approved but didn't say what it was in regards to.  Next appt 9/3

## 2024-02-20 NOTE — Telephone Encounter (Signed)
 PA for Auvelity  45-105 mg #60/30 day approved with [employer] effective 02/20/24-02/19/25

## 2024-03-08 DIAGNOSIS — Z9484 Stem cells transplant status: Principal | ICD-10-CM

## 2024-03-08 MED ORDER — LEVOTHYROXINE 100 MCG TABLET
ORAL_TABLET | Freq: Every day | ORAL | 1 refills | 0.00000 days
Start: 2024-03-08 — End: ?

## 2024-03-10 MED ORDER — LEVOTHYROXINE 100 MCG TABLET
ORAL_TABLET | Freq: Every day | ORAL | 1 refills | 90.00000 days | Status: CP
Start: 2024-03-10 — End: ?

## 2024-03-11 ENCOUNTER — Inpatient Hospital Stay: Attending: Nurse Practitioner

## 2024-03-11 DIAGNOSIS — C9110 Chronic lymphocytic leukemia of B-cell type not having achieved remission: Secondary | ICD-10-CM | POA: Insufficient documentation

## 2024-03-13 ENCOUNTER — Other Ambulatory Visit

## 2024-03-17 ENCOUNTER — Inpatient Hospital Stay

## 2024-03-17 DIAGNOSIS — C9110 Chronic lymphocytic leukemia of B-cell type not having achieved remission: Secondary | ICD-10-CM

## 2024-03-17 LAB — CBC WITH DIFFERENTIAL (CANCER CENTER ONLY)
Abs Immature Granulocytes: 0.02 K/uL (ref 0.00–0.07)
Basophils Absolute: 0 K/uL (ref 0.0–0.1)
Basophils Relative: 1 %
Eosinophils Absolute: 0.2 K/uL (ref 0.0–0.5)
Eosinophils Relative: 6 %
HCT: 31.9 % — ABNORMAL LOW (ref 36.0–46.0)
Hemoglobin: 9.8 g/dL — ABNORMAL LOW (ref 12.0–15.0)
Immature Granulocytes: 1 %
Lymphocytes Relative: 35 %
Lymphs Abs: 1.1 K/uL (ref 0.7–4.0)
MCH: 31.4 pg (ref 26.0–34.0)
MCHC: 30.7 g/dL (ref 30.0–36.0)
MCV: 102.2 fL — ABNORMAL HIGH (ref 80.0–100.0)
Monocytes Absolute: 0.2 K/uL (ref 0.1–1.0)
Monocytes Relative: 5 %
Neutro Abs: 1.7 K/uL (ref 1.7–7.7)
Neutrophils Relative %: 52 %
Platelet Count: 98 K/uL — ABNORMAL LOW (ref 150–400)
RBC: 3.12 MIL/uL — ABNORMAL LOW (ref 3.87–5.11)
RDW: 13 % (ref 11.5–15.5)
WBC Count: 3.2 K/uL — ABNORMAL LOW (ref 4.0–10.5)
nRBC: 0 % (ref 0.0–0.2)

## 2024-03-17 LAB — BASIC METABOLIC PANEL - CANCER CENTER ONLY
Anion gap: 9 (ref 5–15)
BUN: 55 mg/dL — ABNORMAL HIGH (ref 8–23)
CO2: 18 mmol/L — ABNORMAL LOW (ref 22–32)
Calcium: 9.8 mg/dL (ref 8.9–10.3)
Chloride: 111 mmol/L (ref 98–111)
Creatinine: 3.12 mg/dL — ABNORMAL HIGH (ref 0.44–1.00)
GFR, Estimated: 16 mL/min — ABNORMAL LOW (ref >60)
Glucose, Bld: 96 mg/dL (ref 70–99)
Potassium: 5.2 mmol/L — ABNORMAL HIGH (ref 3.5–5.1)
Sodium: 138 mmol/L (ref 135–145)

## 2024-03-17 LAB — RENAL FUNCTION PANEL
Albumin: 4.3 g/dL (ref 3.5–5.0)
Anion gap: 9 (ref 5–15)
BUN: 56 mg/dL — ABNORMAL HIGH (ref 8–23)
CO2: 19 mmol/L — ABNORMAL LOW (ref 22–32)
Calcium: 9.9 mg/dL (ref 8.9–10.3)
Chloride: 111 mmol/L (ref 98–111)
Creatinine, Ser: 3.16 mg/dL — ABNORMAL HIGH (ref 0.44–1.00)
GFR, Estimated: 16 mL/min — ABNORMAL LOW (ref >60)
Glucose, Bld: 96 mg/dL (ref 70–99)
Phosphorus: 4 mg/dL (ref 2.5–4.6)
Potassium: 5.1 mmol/L (ref 3.5–5.1)
Sodium: 139 mmol/L (ref 135–145)

## 2024-03-26 ENCOUNTER — Ambulatory Visit: Admitting: Psychiatry

## 2024-04-01 DIAGNOSIS — N184 Chronic kidney disease, stage 4 (severe): Secondary | ICD-10-CM | POA: Diagnosis not present

## 2024-04-01 DIAGNOSIS — Z9484 Stem cells transplant status: Secondary | ICD-10-CM | POA: Diagnosis not present

## 2024-04-01 DIAGNOSIS — N2581 Secondary hyperparathyroidism of renal origin: Secondary | ICD-10-CM | POA: Diagnosis not present

## 2024-04-01 DIAGNOSIS — Z23 Encounter for immunization: Secondary | ICD-10-CM | POA: Diagnosis not present

## 2024-04-01 DIAGNOSIS — Z9481 Bone marrow transplant status: Secondary | ICD-10-CM | POA: Diagnosis not present

## 2024-04-01 DIAGNOSIS — Z7682 Awaiting organ transplant status: Secondary | ICD-10-CM | POA: Diagnosis not present

## 2024-04-01 DIAGNOSIS — E872 Acidosis, unspecified: Secondary | ICD-10-CM | POA: Diagnosis not present

## 2024-04-01 DIAGNOSIS — E878 Other disorders of electrolyte and fluid balance, not elsewhere classified: Secondary | ICD-10-CM | POA: Diagnosis not present

## 2024-04-06 MED ORDER — TRAZODONE 50 MG TABLET
ORAL_TABLET | ORAL | 2 refills | 0.00000 days
Start: 2024-04-06 — End: ?

## 2024-04-07 MED ORDER — TRAZODONE 50 MG TABLET
ORAL_TABLET | ORAL | 2 refills | 0.00000 days | Status: CP
Start: 2024-04-07 — End: ?

## 2024-04-08 ENCOUNTER — Telehealth: Payer: Self-pay | Admitting: Nurse Practitioner

## 2024-04-10 DIAGNOSIS — C9110 Chronic lymphocytic leukemia of B-cell type not having achieved remission: Principal | ICD-10-CM

## 2024-04-14 ENCOUNTER — Inpatient Hospital Stay: Attending: Nurse Practitioner

## 2024-04-15 DIAGNOSIS — D619 Aplastic anemia, unspecified: Secondary | ICD-10-CM | POA: Diagnosis not present

## 2024-04-15 DIAGNOSIS — D849 Immunodeficiency, unspecified: Secondary | ICD-10-CM | POA: Diagnosis not present

## 2024-04-15 DIAGNOSIS — Z7682 Awaiting organ transplant status: Secondary | ICD-10-CM | POA: Diagnosis not present

## 2024-04-15 DIAGNOSIS — Z01818 Encounter for other preprocedural examination: Secondary | ICD-10-CM | POA: Diagnosis not present

## 2024-04-15 DIAGNOSIS — F3342 Major depressive disorder, recurrent, in full remission: Secondary | ICD-10-CM | POA: Diagnosis not present

## 2024-04-25 DIAGNOSIS — Z23 Encounter for immunization: Secondary | ICD-10-CM | POA: Diagnosis not present

## 2024-05-14 ENCOUNTER — Inpatient Hospital Stay: Attending: Nurse Practitioner

## 2024-05-14 DIAGNOSIS — C9110 Chronic lymphocytic leukemia of B-cell type not having achieved remission: Secondary | ICD-10-CM | POA: Insufficient documentation

## 2024-05-14 LAB — BASIC METABOLIC PANEL - CANCER CENTER ONLY
Anion gap: 12 (ref 5–15)
BUN: 60 mg/dL — ABNORMAL HIGH (ref 8–23)
CO2: 23 mmol/L (ref 22–32)
Calcium: 9.9 mg/dL (ref 8.9–10.3)
Chloride: 107 mmol/L (ref 98–111)
Creatinine: 2.91 mg/dL — ABNORMAL HIGH (ref 0.44–1.00)
GFR, Estimated: 17 mL/min — ABNORMAL LOW (ref >60)
Glucose, Bld: 110 mg/dL — ABNORMAL HIGH (ref 70–99)
Potassium: 4.8 mmol/L (ref 3.5–5.1)
Sodium: 141 mmol/L (ref 135–145)

## 2024-05-14 LAB — CBC WITH DIFFERENTIAL (CANCER CENTER ONLY)
Abs Immature Granulocytes: 0.01 K/uL (ref 0.00–0.07)
Basophils Absolute: 0 K/uL (ref 0.0–0.1)
Basophils Relative: 1 %
Eosinophils Absolute: 0.1 K/uL (ref 0.0–0.5)
Eosinophils Relative: 7 %
HCT: 32.9 % — ABNORMAL LOW (ref 36.0–46.0)
Hemoglobin: 10.2 g/dL — ABNORMAL LOW (ref 12.0–15.0)
Immature Granulocytes: 1 %
Lymphocytes Relative: 54 %
Lymphs Abs: 1.2 K/uL (ref 0.7–4.0)
MCH: 32 pg (ref 26.0–34.0)
MCHC: 31 g/dL (ref 30.0–36.0)
MCV: 103.1 fL — ABNORMAL HIGH (ref 80.0–100.0)
Monocytes Absolute: 0.2 K/uL (ref 0.1–1.0)
Monocytes Relative: 12 %
Neutro Abs: 0.5 K/uL — ABNORMAL LOW (ref 1.7–7.7)
Neutrophils Relative %: 25 %
Platelet Count: 124 K/uL — ABNORMAL LOW (ref 150–400)
RBC: 3.19 MIL/uL — ABNORMAL LOW (ref 3.87–5.11)
RDW: 12.7 % (ref 11.5–15.5)
WBC Count: 2.1 K/uL — ABNORMAL LOW (ref 4.0–10.5)
nRBC: 0 % (ref 0.0–0.2)

## 2024-05-14 LAB — RENAL FUNCTION PANEL
Albumin: 4.4 g/dL (ref 3.5–5.0)
Anion gap: 12 (ref 5–15)
BUN: 60 mg/dL — ABNORMAL HIGH (ref 8–23)
CO2: 23 mmol/L (ref 22–32)
Calcium: 9.8 mg/dL (ref 8.9–10.3)
Chloride: 107 mmol/L (ref 98–111)
Creatinine, Ser: 2.9 mg/dL — ABNORMAL HIGH (ref 0.44–1.00)
GFR, Estimated: 17 mL/min — ABNORMAL LOW (ref >60)
Glucose, Bld: 111 mg/dL — ABNORMAL HIGH (ref 70–99)
Phosphorus: 5.1 mg/dL — ABNORMAL HIGH (ref 2.5–4.6)
Potassium: 4.9 mmol/L (ref 3.5–5.1)
Sodium: 141 mmol/L (ref 135–145)

## 2024-05-26 DIAGNOSIS — C9110 Chronic lymphocytic leukemia of B-cell type not having achieved remission: Principal | ICD-10-CM

## 2024-05-28 ENCOUNTER — Encounter: Payer: Self-pay | Admitting: Psychiatry

## 2024-05-28 ENCOUNTER — Ambulatory Visit: Admitting: Psychiatry

## 2024-05-28 DIAGNOSIS — F331 Major depressive disorder, recurrent, moderate: Secondary | ICD-10-CM

## 2024-05-28 DIAGNOSIS — F4310 Post-traumatic stress disorder, unspecified: Secondary | ICD-10-CM | POA: Diagnosis not present

## 2024-05-28 DIAGNOSIS — F5105 Insomnia due to other mental disorder: Secondary | ICD-10-CM

## 2024-05-28 DIAGNOSIS — F4001 Agoraphobia with panic disorder: Secondary | ICD-10-CM | POA: Diagnosis not present

## 2024-05-28 MED ORDER — LITHIUM CARBONATE 150 MG PO CAPS: 150.0000 mg | ORAL_CAPSULE | Freq: Every day | ORAL | 1 refills | Status: AC

## 2024-05-28 MED ORDER — AUVELITY 45-105 MG PO TBCR: EXTENDED_RELEASE_TABLET | ORAL | 1 refills | Status: AC

## 2024-05-28 MED ORDER — TRAZODONE HCL 50 MG PO TABS: 50.0000 mg | ORAL_TABLET | Freq: Every day | ORAL | 1 refills | Status: AC

## 2024-05-28 MED ORDER — ALPRAZOLAM 0.25 MG PO TABS
ORAL_TABLET | ORAL | 2 refills | Status: AC
Start: 2024-05-28 — End: ?

## 2024-05-28 NOTE — Progress Notes (Signed)
 Meagan Walsh [id number] [birth year] 63 y.o.    Subjective:   Patient ID:  Meagan Walsh is a 63 y.o. (DOB [birth year]) female.  Chief Complaint:  Chief Complaint  Patient presents with   Follow-up   Depression   Stress    Meagan Walsh presents to the office today for follow-up of bipolar depression.  seen in January 2020.  Mood was stable and no meds were changed.   seen May 2020.  Lithium  level was 0.68.  Mood was stable and no meds were changed.  Discussed her care with her oncologist Dr. Cloretta in June 2020.  She was dealing with pancytopenia and it was requested that she wean off lamotrigine  to see if it was contributing.  That was done.  August 11, 2019 another conversation with Dr. Cloretta.  She had elevated lithium  of 2.1 on August 08, 2019.  She had lost some weight and was having some nausea and vomiting.  Lithium  was held for a few days then restarted at 300 mg daily.  Lithium  levels have been checked frequently since that time.  seen September 24, 2019.  She was not depressed but struggling with nausea and poor appetite and the following changes were made to see if it would help. In view of chronic nausea and weight loss despite med changes consider Trintellix .  Trintellix  does cause nausea about 10% of people. It was tolerated in the past. Reduce Trintellix  to 10 mg daily. Add mirtazapine  30 mg tablet at night which could help appetite and potentially replace Trintellix . If nausea is not resolved at follow up then stop Trintellix .  Call if depression worsens.  10/29/2019 appt noted: Did fine with med changes.  No problems with reduction in Trintellix .  Gained 11# since here.  At a good weight.  Wonders about stopping it for awhile. Good appetite.  No nausea now.  Still struggling with  CLL.  Needs bone marrow transplant but hasn't been able to occur yet and will be done in [place] with her living there for 3 mos.  Tired of being in limbo.  Rare Xanax  used as  nausea med.  Using Ambien  at HS.  Sleep alright.   In spite of stress has not been depressed.  Patient reports stable mood and denies depressed or irritable moods.  Patient denies any recent difficulty with anxiety.  Patient denies difficulty with sleep initiation or maintenance with Ambien . Denies appetite disturbance.  No weight loss.  Patient reports that energy and motivation have been good.  Patient denies any difficulty with concentration.  Patient denies any suicidal ideation. Plan: Continue Trintellix  to 10 mg daily. Continue or stop mirtazapine  30 mg tablet at night per her request. Weight loss resolved.  10/22/2020 appt noted: 2nd bone marrow transplant in [place] in Jan. Will be there at least a couple of more months. Pending lithium  level from today. Lost sig weight this year about 108#.  Feeding tube. Restarted mirtazapine  30 HS about a week ago. Has had a lot of nausea.   Takes alprazolam  this week DT panicky feelings like walls caving in.  Worry over wasting away.  Seemed to help her.  Last day or 2 only needed one.  Don't think I've been depressed, certainly not like in the past.  More like anxiety feeling trapped.  In general feels ok. Still on trintellix  10 HS, lithium  300 mg HS, Ambien  5-10 mg HS. Sleep is pretty good.  Now labs 3 times per week and adjusting antirejection  med.   Tolerating psych meds as far as known bc no history of SE with psych meds.  03/25/2021 TC with oncology: Return telephone call to oncologist at [facility] Dr. Sharlot. Reports that the patient is not eating well and has required a G-tube as well as a central line.  Dr. Sharlot believes the patient has developed anorexia nervosa and is restricting not only calories but also fluids.  There is no other reasons for the gastric tube or further central line other than maintaining hydration and nutrition because she will not take in enough orally. The patient is no longer on mirtazapine  but is still on  Trintellix .  Suggested the oncologist restart the mirtazapine  30 mg nightly because it helped the patient gain weight before.  We will get her in to the office as soon as possible.  If in fact she does have symptoms of any her anorexia nervosa then she will need more intensive therapy.  Dr. Sharlot believes she may need an inpatient anorexia nervosa program.  04/01/2021 appt noted: Covid 03/04/21.   She's aware oncology is concerned she might have anorexia.  Hard time gaining weight.  Full so fast and when try to push it.  When in hospital went so long without eating.  Has feeding tube and high calorie food gives her diarrhea. Doesn't remember why she stopped mirtazapine . I'm too thin and hate the way I look.  Doesn't want to lose more bc of health danger.  No thirst and relying on Pick line for hydration getting IV fluids daily.   No history of EDO when younger. Not depressed.  Not anxious unusuallly.  It is better now.  Sleep is OK and about 9 hours nightly.  Still takes Ambien  to go to sleep.  04/15/21 appt noted: Mirtazapine  helped appetite and eaten more.  Diarrhea 2 days and have been off.  Might have gained a little.  A little better with fluids but not great.  Pick line removed.  Put fluids in GI tube.   No problems with mirtazapine . Sometimes has diarrhea but not usually. Weigh herself.   08/21/2022 urgent appointment noted: Has not been seen in some time and had weaned off all psych meds.  Has become more urgently depressed. No psych meds except trazodone for sleep.  No other psych meds for a year.. Regained wt lost in chemo.  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  Still low WBC. Called since early January more blue and not getting out of house.  Got Covid 2 weeks ago and recovered.  No post-Covid fatigue. Stress son in crisis in [place] and needs to fly out to help.  Fears going back into depression and got scared.  Calling a therpist soon.  Too much free time.  No SI.   Thinks it's Ok to resume lithium  again as she stopped while in more intensive chemo.  Those medical concerns are no longer a problem.s   Sometimes THC gummy to help her feel calmer and sleep better prn.  2 times a month has taken alprazolam .   Appetite is oK Plan: Resume lithium  300 mg HS  10/11/22 appt noted: I feel really good.  Dr. Carter bone marrow doctor thinks benefit due to olanzapine  2.5 mg restarted early Feb after stopping it in Nov.  Was taking as an antinausea med. My lifestyle more conducive to not being depressed.   Now more active and got depressed after Covid. Meds: lithium  300 mg HS, olanzapine  2.5  mg HS, trazodone 100 mg HS Patient reports stable mood and denies depressed or irritable moods.  Patient denies any recent difficulty with anxiety.  Patient denies difficulty with sleep initiation or maintenance. Denies appetite disturbance.  Patient reports that energy and motivation have been good.  Patient denies any difficulty with concentration.  Patient denies any suicidal ideation. Plan: Hold lithium  300 mg HS DT kidney function, Cr 2.4 on 10/01/22.  The elevation is not new. Continue olanzapine  2.5 mg HS for mood Continue trazodone 100 HS Alprazolam  prn   12/20/22 appt noted: Psych med: olanzapine  2.5 mg taking at 8 PM, trazodone 50-100 mg HS prn.  No other antidepressant. Delta 9 gummies. No lithium  for a couple of mos. Still trouble falling asleep.  Goes to bed 1030.   Not dep.  But not enough going on right now.   Misses kids in [place].  Able to travel now.  Helps. Still on some anti-rejection meds.   Stage 4 kidney dz.  Will be on transplant list. Plan: no changes  02/14/23 TC resumed lithium  150 mg daily for 2 weeks without benefit.  Wonders about increasing the dose.   Urgent appt sched instead Feels scared of going into deep dep again.  Getting worse. Started after flu and had to stay in the house a couple of weeks and then the dep began coming back.  Started 19  June.   For a couple of mos isolated more and bored.  Not busy enough.  Somewhat down over kids in [place] and misses them.  02/16/23 urgent appt noted: Feels scared of going into deep dep again.  Getting worse. Started after flu and had to stay in the house a couple of weeks and then the dep began coming back.  Started 19 June.   For a couple of mos isolated more and bored.  Not busy enough.  Somewhat down over kids in [place] and misses them. Anxiety ridden terrified depression.   Not eating great since dep.  Not sleeping well either.  Lie there and ruminate.   Plan: Start Auvelity  for depression, 1 in the AM for 1 week then 1 twice daily. Increase lithium  to 300 mg or 2 of the 150 mg capsules. This worked before.  03/09/23  appt noted: Started feeling better after 3 days and gradually better thereafter.  90% better with depression .  Feels a little spacey and H notices. Some weakness.  Went so long without exercising. Not worse she thinks.   No dizziness.  No SE.  Minimal nausea except at night and history of this.  No other concerns.   Plan: Continue olanzapine  2.5 mg HS for mood Continue trazodone 100 HS Alprazolam  prn HS.   continue Auvelity  for depression 1 twice daily. Reduce lithium  to 2 capsules on even days of the month and 1 capsule on odd days of the month.  Con Dawna DASEN, CMA  to Me    03/15/23  9:47 AM Note Patient reports severe constipation. She was advised by her GI provider to take Miralax. Said she took 2 doses for 2 days and then was told to take every hour yesterday. She has had no results thus far. She called GI again and she reports that Auvelity  could be the cause. Asking for guidance.     Me  to Con Dawna DASEN, CMA  CC   03/15/23  5:53 PM Note Auvelity  has a 4% risk constipation and 6% risk diarrhea. Stop Auvelity  until the constipation resolves.  Take Miralax daily.  Then resume 1 Auvelity  in the AM     04/25/23 appt noted: Psych meds: Auvelity  1 every  morning, lithium  150 mg capsule 2 alternating with 1 every other day, olanzapine  2.5 nightly. Doing well .  Not dep.  Tolerating meds. Constipation resolved so far. Went to [place] and [place] visiting son. H doing well.  Going to wedding in [place].  He's still working.   Sleep and appetite are good.  Geoffry Lorene KANDICE Mickey., MD at 05/24/2023 11:59 PM  Status: Signed  Lithium  level 1.0 on 150/300 mg every other day. Consider reduction to 150 mg daily. BMP stable. Consider reduction afte rthe holidays. Lorene Geoffry, MD, DFAPA     07/26/23 appt noted: Psych meds: Auvelity  1 every morning, lithium  150 mg capsule 2 alternating with 1 every other day, stopped olanzapine  2.5 nightly, trazodone 50 mg HS.SABRA Asks for alprazolam  prn RX.   Patient reports stable mood and denies depressed or irritable moods.  Patient denies any recent difficulty with anxiety.  Patient denies difficulty with sleep initiation or maintenance usually. Denies appetite disturbance.  Patient reports that energy and motivation have been good.  Patient denies any difficulty with concentration.  Patient denies any suicidal ideation. Plan: Continue olanzapine  2.5 mg HS for mood Continue trazodone 100 HS Alprazolam  prn HS.   continue Auvelity  for depression 1 daily. Continue lithium  150mg   2 capsules on even days of the month and 1 capsule on odd days of the month Until after winter:  then in march reduce to 225/150 mg QOD Check level every 3 mos.   12/05/23 appt noted: Med: Auvelity  1 every morning, lithium  150 mg capsule 2 alternating with 1 every other day, stopped olanzapine  2.5 nightly, trazodone 50 mg HS, , alprazolam  0.25 mg prn about weekly. Didn't get dep this winter.   Good overall staying busy.   No SE with meds.   Sleep ok with trazodone. Not usually anxious.   Appetite and wt stable.  Checked [employer] [facility]. Plan: Continue trazodone 100 HS Alprazolam  prn HS.   continue Auvelity  for depression 1 daily.  Has  worked so far. As planned reduce lithium  150 mg once daily DT kidneys.  Disc risk relapse of dep and call. Check level.  05/28/24 appt noted:  Med: Auvelity  1 every morning, lithium  150 mg day, stopped olanzapine  2.5 nightly, trazodone 50 mg HS, , alprazolam  0.25 mg prn about weekly. Son getting married this month.  Dec 1 family going to [place] .   Kidneys failing and will need transplant.  D will donate kidney. Doing fine with less lithium .   2 severe episodes depression in life that lasted a full year.  Multiple failed prior medication trials include but are not limited to the following:   duloxetine, Viibryd,  Trintellix  20 mg failed until lithium  was added Rexulti 0.5 mg caused restlessness, Abilify, Vraylar, Mirtazapine  30 helped appetite. 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  Gastrointestinal:  Negative for abdominal pain, diarrhea, nausea and vomiting.  Neurological:  Negative for dizziness, tremors and weakness.  Psychiatric/Behavioral:  Negative for agitation, behavioral problems, confusion, decreased concentration, dysphoric mood, hallucinations, self-injury, sleep disturbance and suicidal ideas. The patient is not nervous/anxious and is not hyperactive.     Medications: I have reviewed the patient's current medications.  Current Outpatient Medications  Medication Sig Dispense Refill   amLODipine  (NORVASC ) 2.5 MG tablet Take 1 tablet by mouth daily.     calcitRIOL (ROCALTROL) 0.25 MCG capsule 1 capsule Orally Daily for 90 days     levothyroxine  (SYNTHROID ) 100 MCG tablet TAKE 1 TABLET BY MOUTH EVERY DAY 30 tablet 2   LOKELMA 10 g PACK packet Take by mouth.     tacrolimus  (PROGRAF ) 0.5 MG capsule Take 1.5 mg by mouth in the morning and at bedtime.     ALPRAZolam  (XANAX ) 0.25 MG tablet TAKE 1 TO 2  TABLETS BY MOUTH EVERY DAY AS NEEDED FOR ANXIETY 60 tablet 2   Dextromethorphan-buPROPion ER (AUVELITY ) 45-105 MG TBCR 1 tablet daily 90 tablet 1   lithium  carbonate 150 MG capsule Take 1 capsule (150 mg total) by mouth daily at 12 noon. 90 capsule 1   traZODone (DESYREL) 50 MG tablet Take 1 tablet (50 mg total) by mouth at bedtime. 2 TABS hs 180 tablet 1   No current facility-administered medications for this visit.    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   Hypothyroidism    Lymphocytosis    Mitral valve prolapse    Mitral valve prolapse    Thyroid  disease    Hypothyroid    Family History  Problem Relation Age of Onset   Breast cancer Mother    Prostate cancer Father    Colon cancer Neg Hx    Esophageal cancer Neg Hx    Pancreatic cancer Neg Hx    Stomach cancer Neg Hx     Social History   Socioeconomic History   Marital status: Married    Spouse name: Not on file   Number of children: Not on file   Years of education: Not on file   Highest education level: Not on file  Occupational History   Not on file  Tobacco Use   Smoking status: Never   Smokeless tobacco: Never  Vaping Use   Vaping status: Not on file  Substance and Sexual Activity   Alcohol use: Not Currently    Comment: 2 glasses of wine 2 times per week    Drug use: No   Sexual activity: Not on file  Other Topics Concern   Not on file  Social History Narrative   Pt lives in 2 story home with her husband   Has 3 adult children   Masters degree   Works at [employer]   Social Drivers of Corporate Investment Banker Strain: Low Risk  (09/25/2020)   Received from [facility]   Overall Financial Resource Strain (CARDIA)    Difficulty of  Paying Living Expenses: Not very hard  Food Insecurity: No Food Insecurity (09/25/2023)   Received from [facility]   Hunger Vital Sign    Within the past 12 months, you worried that your food would run out before you got the money to buy more.: Never true    Within the past 12 months, the food you bought just didn't last and you didn't have money to get more.: Never true  Transportation Needs: No Transportation Needs (09/25/2023)   Received  from [facility] - Transportation    In the past 12 months, has lack of transportation kept you from medical appointments or from getting medications?: No    Lack of Transportation (Non-Medical): No  Physical Activity: Not on file  Stress: Not on file  Social Connections: Not on file  Intimate Partner Violence: Not on file    Past Medical History, Surgical history, Social history, and Family history were reviewed and updated as appropriate.   Please see review of systems for further details on the patient's review from today.   Objective:   Physical Exam:  There were no vitals taken for this visit.  Physical Exam Constitutional:      General: She is not in acute distress.    Appearance: She is well-developed.  Musculoskeletal:        General: No deformity.  Neurological:     Mental Status: She is alert and oriented to person, place, and time.     Motor: No tremor.     Coordination: Coordination normal.     Gait: Gait normal.  Psychiatric:        Attention and Perception: Attention and perception normal.        Mood and Affect: Mood is not anxious or depressed. Affect is not tearful or inappropriate.        Speech: Speech normal.        Behavior: Behavior normal.        Thought Content: Thought content normal. Thought content is not delusional. Thought content does not include homicidal or suicidal ideation. Thought content does not include suicidal plan.        Cognition and Memory: Cognition  normal.        Judgment: Judgment normal.     Comments: Insight intact. No auditory or visual hallucinations. No delusions.  No anxiety and dep resolved     Lab Review:     Component Value Date/Time   NA 141 05/14/2024 1156   NA 141 05/14/2024 1156   K 4.9 05/14/2024 1156   K 4.8 05/14/2024 1156   CL 107 05/14/2024 1156   CL 107 05/14/2024 1156   CO2 23 05/14/2024 1156   CO2 23 05/14/2024 1156   GLUCOSE 111 (H) 05/14/2024 1156   GLUCOSE 110 (H) 05/14/2024 1156   BUN 60 (H) 05/14/2024 1156   BUN 60 (H) 05/14/2024 1156   CREATININE 2.90 (H) 05/14/2024 1156   CREATININE 2.91 (H) 05/14/2024 1156   CREATININE 2.63 (H) 05/24/2023 1429   CALCIUM 9.8 05/14/2024 1156   CALCIUM 9.9 05/14/2024 1156   PROT 6.7 10/04/2023 1056   ALBUMIN 4.4 05/14/2024 1156   AST 44 (H) 10/04/2023 1056   ALT 68 (H) 10/04/2023 1056   ALKPHOS 59 10/04/2023 1056   BILITOT 0.4 10/04/2023 1056   GFRNONAA 17 (L) 05/14/2024 1156   GFRNONAA 17 (L) 05/14/2024 1156   GFRAA >60 12/17/2019 0830       Component Value Date/Time   WBC 2.1 (L) 05/14/2024 1156   WBC 9.9 03/31/2019 0845   RBC 3.19 (L) 05/14/2024 1156   HGB 10.2 (L) 05/14/2024 1156   HGB 14.0 06/27/2016 1038   HCT 32.9 (L) 05/14/2024 1156   HCT 42.0 06/27/2016 1038   PLT 124 (L) 05/14/2024 1156   PLT 205 06/27/2016 1038   MCV 103.1 (H) 05/14/2024 1156   MCV 92.9 06/27/2016 1038   MCH 32.0 05/14/2024 1156   MCHC 31.0 05/14/2024 1156   RDW 12.7  05/14/2024 1156   RDW 12.2 06/27/2016 1038   LYMPHSABS 1.2 05/14/2024 1156   LYMPHSABS 21.5 (H) 06/27/2016 1038   MONOABS 0.2 05/14/2024 1156   MONOABS 0.5 06/27/2016 1038   EOSABS 0.1 05/14/2024 1156   EOSABS 0.2 06/27/2016 1038   BASOSABS 0.0 05/14/2024 1156   BASOSABS 0.1 06/27/2016 1038    Lithium  Lvl  Date Value Ref Range Status  05/24/2023 1.0 0.6 - 1.2 mmol/L Final  05/24/23 Lithium  level 1.0 on 150/300 mg every other day.  02/21/23 lithium  0.8 on 300 mg daily   Component 08/24/20  08/14/20 04/02/20 01/07/20  Lithium  Lvl 0.4 0.6 0.8 0.6   on 300 mg daily  Component 12/19/22 11/14/22 10/10/22 09/06/22 08/08/22 07/14/22  Sodium 143 142 141 141 141 145  Potassium 4.2 4.3 4.0 4.2 5.2 High  4.8  Chloride 113 High  114 High  113 High  113 High  113 High  117 High   CO2 25.0 20.0 24.0 25.0 24.0 22.0  Anion Gap 5 8 4  Low  3 Low  4 Low  6  BUN 46 High  58 High  46 High  50 High  51 High  48 High   Creatinine 2.09 High  2.54 High  2.43 High  2.65 High  2.90 High  2.73 High   BUN/Creatinine Ratio 22 23 19 19 18 18   eGFR CKD-EPI (2021) Female 26 Low   21 Low   22 Low   20 Low   18 Low   19 Low      No results found for: PHENYTOIN, PHENOBARB, VALPROATE, CBMZ   .res Assessment: Plan:    Major depressive disorder, recurrent episode, moderate (HCC) - Plan: Lithium  level, lithium  carbonate 150 MG capsule, Dextromethorphan-buPROPion ER (AUVELITY ) 45-105 MG TBCR  Panic disorder with agoraphobia - Plan: ALPRAZolam  (XANAX ) 0.25 MG tablet  PTSD (post-traumatic stress disorder) - Plan: ALPRAZolam  (XANAX ) 0.25 MG tablet  Insomnia due to mental condition - Plan: ALPRAZolam  (XANAX ) 0.25 MG tablet, traZODone (DESYREL) 50 MG tablet   30 min face to face time with patient .  We discussed Evalena has had history of 2 very severe prolonged depressions that failed to respond to multiple psychiatric medications but then responded to lithium  potentiation.  She has been through recent winters with no difficulty.  She has a very strong seasonal pattern.  She previously failed to respond to several antidepressants including her last antidepressant which was Trintellix  20 mg until lithium  300 mg daily was added and she went into remission. She remained in remission through treatment for cancer during which time she had to stop all her psychiatric medications.  She has remained in remission until this January 2024 .SABRA  Her cancer has remained in remission.  Her depression resolved again  when here in January 2024 after resuming lithium  300 mg nightly.  However her cancer doctor also resumed olanzapine  2.5 mg nightly at about the same time and there is a question as to which med change helped her.  Because of her high creatinine level we stopped the lithium  in the hopes that the olanzapine  was what actually improved her mood.   However dep relapse after flu June 2024.  resolved again with Auvelity  and increase in lithium  to 300 mg daily . August 2024 we reduced lithium  to 150/300 every other day  with no relapse.  Pending kidney transplant.  Continue trazodone 100 HS Alprazolam  prn HS.   continue Auvelity  for depression 1 daily.  Has worked so far.  As planned reduce lithium  150 mg once daily  Check level now  and then 8 weeks after kidney transplant in late Jan Might need higher dose after transplant but will not incr unless starts getting more dep.  Call if recurrence of depression  FU 3-4  mos  Lorene Macintosh, MD, DFAPA   Please see After Visit Summary for patient specific instructions.  Future Appointments  Date Time Provider Department Center  06/13/2024 12:00 PM [facility] PHLEBOTOMIST [facility] None  06/23/2024 12:00 PM [facility] PHLEBOTOMIST [facility] None  10/15/2024  2:30 PM Cottle, Lorene KANDICE Raddle., MD [facility] None       Orders Placed This Encounter  Procedures   Lithium  level      -------------------------------

## 2024-05-30 ENCOUNTER — Telehealth: Payer: Self-pay | Admitting: Nurse Practitioner

## 2024-06-01 DIAGNOSIS — Z9484 Stem cells transplant status: Principal | ICD-10-CM

## 2024-06-01 MED ORDER — LOKELMA 10 GRAM ORAL POWDER PACKET
PACK | 5 refills | 0.00000 days
Start: 2024-06-01 — End: ?

## 2024-06-02 ENCOUNTER — Other Ambulatory Visit: Payer: Self-pay | Admitting: *Deleted

## 2024-06-02 DIAGNOSIS — Z5181 Encounter for therapeutic drug level monitoring: Secondary | ICD-10-CM

## 2024-06-02 MED ORDER — LOKELMA 10 GRAM ORAL POWDER PACKET
PACK | ORAL | 5 refills | 0.00000 days | Status: CP
Start: 2024-06-02 — End: ?

## 2024-06-02 NOTE — Progress Notes (Signed)
 Patient requesting Dr. Andriette office draw her lithium  level for Dr. Geoffry and send him results.

## 2024-06-03 ENCOUNTER — Ambulatory Visit: Payer: Self-pay | Admitting: Oncology

## 2024-06-03 ENCOUNTER — Inpatient Hospital Stay: Attending: Nurse Practitioner

## 2024-06-03 DIAGNOSIS — Z5181 Encounter for therapeutic drug level monitoring: Secondary | ICD-10-CM | POA: Diagnosis not present

## 2024-06-03 LAB — LITHIUM LEVEL: Lithium Lvl: 0.5 mmol/L — ABNORMAL LOW (ref 0.60–1.20)

## 2024-06-09 ENCOUNTER — Telehealth: Payer: Self-pay | Admitting: Psychiatry

## 2024-06-09 NOTE — Telephone Encounter (Signed)
 Pt reporting lithium  level 11/11 of 0.5 on 150 mg once daily. She reports she feels good. Wants to know if any changes need to be made.

## 2024-06-09 NOTE — Telephone Encounter (Signed)
 Pt left message stating lithium  level is low. Feel good. What to do? Rtc [phone]

## 2024-06-09 NOTE — Telephone Encounter (Signed)
 Lithium  level 0.5 on 150 mg is great.  No changes indicated.

## 2024-06-10 DIAGNOSIS — Z9484 Stem cells transplant status: Principal | ICD-10-CM

## 2024-06-10 DIAGNOSIS — C9110 Chronic lymphocytic leukemia of B-cell type not having achieved remission: Principal | ICD-10-CM

## 2024-06-10 NOTE — Telephone Encounter (Signed)
Recommendations reviewed with patient.

## 2024-06-11 ENCOUNTER — Other Ambulatory Visit: Admit: 2024-06-11 | Discharge: 2024-06-11 | Payer: BLUE CROSS/BLUE SHIELD

## 2024-06-11 ENCOUNTER — Ambulatory Visit: Admit: 2024-06-11 | Discharge: 2024-06-11 | Payer: BLUE CROSS/BLUE SHIELD

## 2024-06-11 ENCOUNTER — Telehealth: Payer: Self-pay | Admitting: Oncology

## 2024-06-11 DIAGNOSIS — Z9484 Stem cells transplant status: Secondary | ICD-10-CM | POA: Diagnosis not present

## 2024-06-11 DIAGNOSIS — D84822 Immunodeficiency due to external causes: Secondary | ICD-10-CM | POA: Diagnosis not present

## 2024-06-11 DIAGNOSIS — Z5181 Encounter for therapeutic drug level monitoring: Secondary | ICD-10-CM | POA: Diagnosis not present

## 2024-06-11 DIAGNOSIS — C9110 Chronic lymphocytic leukemia of B-cell type not having achieved remission: Secondary | ICD-10-CM | POA: Diagnosis not present

## 2024-06-11 NOTE — Telephone Encounter (Signed)
 PT called to cancel lab appt.

## 2024-06-13 ENCOUNTER — Other Ambulatory Visit

## 2024-06-16 DIAGNOSIS — I10 Essential (primary) hypertension: Secondary | ICD-10-CM | POA: Diagnosis not present

## 2024-06-16 DIAGNOSIS — N184 Chronic kidney disease, stage 4 (severe): Secondary | ICD-10-CM | POA: Diagnosis not present

## 2024-06-16 DIAGNOSIS — N2581 Secondary hyperparathyroidism of renal origin: Secondary | ICD-10-CM | POA: Diagnosis not present

## 2024-06-16 DIAGNOSIS — E872 Acidosis, unspecified: Secondary | ICD-10-CM | POA: Diagnosis not present

## 2024-06-17 DIAGNOSIS — R220 Localized swelling, mass and lump, head: Secondary | ICD-10-CM | POA: Diagnosis not present

## 2024-06-21 DIAGNOSIS — N764 Abscess of vulva: Secondary | ICD-10-CM | POA: Diagnosis not present

## 2024-06-21 DIAGNOSIS — C9110 Chronic lymphocytic leukemia of B-cell type not having achieved remission: Principal | ICD-10-CM

## 2024-06-22 DIAGNOSIS — C9110 Chronic lymphocytic leukemia of B-cell type not having achieved remission: Principal | ICD-10-CM

## 2024-06-23 ENCOUNTER — Other Ambulatory Visit

## 2024-06-27 DIAGNOSIS — C9110 Chronic lymphocytic leukemia of B-cell type not having achieved remission: Principal | ICD-10-CM

## 2024-07-05 MED ORDER — OLANZAPINE 2.5 MG TABLET
ORAL_TABLET | Freq: Every evening | ORAL | 6 refills | 0.00000 days
Start: 2024-07-05 — End: ?

## 2024-07-07 MED ORDER — OLANZAPINE 2.5 MG TABLET
ORAL_TABLET | Freq: Every evening | ORAL | 6 refills | 30.00000 days | Status: CP
Start: 2024-07-07 — End: ?

## 2024-10-15 ENCOUNTER — Ambulatory Visit: Admitting: Psychiatry
